# Patient Record
Sex: Female | Born: 1950
Health system: Southern US, Community
[De-identification: ages and names within clinical notes are randomized; demographics above are authoritative.]

## PROBLEM LIST (undated history)

## (undated) DIAGNOSIS — I5042 Chronic combined systolic (congestive) and diastolic (congestive) heart failure: Secondary | ICD-10-CM

## (undated) DIAGNOSIS — N186 End stage renal disease: Secondary | ICD-10-CM

## (undated) DIAGNOSIS — Z9289 Personal history of other medical treatment: Secondary | ICD-10-CM

## (undated) DIAGNOSIS — I10 Essential (primary) hypertension: Secondary | ICD-10-CM

## (undated) DIAGNOSIS — I2699 Other pulmonary embolism without acute cor pulmonale: Secondary | ICD-10-CM

## (undated) DIAGNOSIS — Z992 Dependence on renal dialysis: Secondary | ICD-10-CM

## (undated) DIAGNOSIS — F411 Generalized anxiety disorder: Secondary | ICD-10-CM

## (undated) HISTORY — DX: Chronic combined systolic (congestive) and diastolic (congestive) heart failure: I50.42

## (undated) HISTORY — PX: CATARACT EXTRACTION, BILATERAL: SHX1313

## (undated) HISTORY — DX: Generalized anxiety disorder: F41.1

## (undated) HISTORY — DX: Personal history of other medical treatment: Z92.89

## (undated) HISTORY — PX: OTHER SURGICAL HISTORY: SHX169

---

## 2019-11-19 ENCOUNTER — Ambulatory Visit (INDEPENDENT_AMBULATORY_CARE_PROVIDER_SITE_OTHER)
Admission: EM | Admit: 2019-11-19 | Discharge: 2019-11-19 | Disposition: A | Discharge status: Home / Self Care | Payer: No Typology Code available for payment source | Source: Home / Self Care | Attending: Family Medicine | Admitting: Family Medicine

## 2019-11-19 ENCOUNTER — Encounter (HOSPITAL_COMMUNITY): Payer: Self-pay

## 2019-11-19 ENCOUNTER — Emergency Department (HOSPITAL_COMMUNITY)
Admission: EM | Admit: 2019-11-19 | Discharge: 2019-11-20 | Disposition: A | Discharge status: Home / Self Care | Payer: No Typology Code available for payment source | Attending: Emergency Medicine | Admitting: Emergency Medicine

## 2019-11-19 ENCOUNTER — Ambulatory Visit (HOSPITAL_COMMUNITY): Admission: EM | Admit: 2019-11-19 | Discharge: 2019-11-19 | Discharge status: Absent without leave | Payer: Self-pay

## 2019-11-19 ENCOUNTER — Other Ambulatory Visit: Payer: Self-pay

## 2019-11-19 ENCOUNTER — Encounter (HOSPITAL_COMMUNITY): Payer: Self-pay | Admitting: *Deleted

## 2019-11-19 DIAGNOSIS — F329 Major depressive disorder, single episode, unspecified: Secondary | ICD-10-CM | POA: Diagnosis not present

## 2019-11-19 DIAGNOSIS — I10 Essential (primary) hypertension: Secondary | ICD-10-CM

## 2019-11-19 DIAGNOSIS — N390 Urinary tract infection, site not specified: Secondary | ICD-10-CM

## 2019-11-19 DIAGNOSIS — R809 Proteinuria, unspecified: Secondary | ICD-10-CM | POA: Insufficient documentation

## 2019-11-19 DIAGNOSIS — Z634 Disappearance and death of family member: Secondary | ICD-10-CM | POA: Diagnosis not present

## 2019-11-19 DIAGNOSIS — R600 Localized edema: Secondary | ICD-10-CM | POA: Insufficient documentation

## 2019-11-19 LAB — BASIC METABOLIC PANEL
Anion gap: 15 (ref 5–15)
BUN: 15 mg/dL (ref 8–23)
CO2: 27 mmol/L (ref 22–32)
Calcium: 9.8 mg/dL (ref 8.9–10.3)
Chloride: 98 mmol/L (ref 98–111)
Creatinine, Ser: 0.61 mg/dL (ref 0.44–1.00)
GFR calc Af Amer: >60 mL/min (ref >60)
GFR calc non Af Amer: >60 mL/min (ref >60)
Glucose, Bld: 162 mg/dL — ABNORMAL HIGH (ref 70–99)
Potassium: 3.7 mmol/L (ref 3.5–5.1)
Sodium: 140 mmol/L (ref 135–145)

## 2019-11-19 LAB — CBC
HCT: 37.4 % (ref 36.0–46.0)
Hemoglobin: 11.8 g/dL — ABNORMAL LOW (ref 12.0–15.0)
MCH: 26.8 pg (ref 26.0–34.0)
MCHC: 31.6 g/dL (ref 30.0–36.0)
MCV: 84.8 fL (ref 80.0–100.0)
Platelets: 253 10*3/uL (ref 150–400)
RBC: 4.41 MIL/uL (ref 3.87–5.11)
RDW: 13.1 % (ref 11.5–15.5)
WBC: 5.3 10*3/uL (ref 4.0–10.5)
nRBC: 0 % (ref 0.0–0.2)

## 2019-11-19 LAB — POCT URINALYSIS DIP (DEVICE)
Bilirubin Urine: NEGATIVE
Glucose, UA: NEGATIVE mg/dL
Ketones, ur: NEGATIVE mg/dL
Nitrite: NEGATIVE
Protein, ur: 100 mg/dL — AB
Specific Gravity, Urine: 1.01 (ref 1.005–1.030)
Urobilinogen, UA: 0.2 mg/dL (ref 0.0–1.0)
pH: 6 (ref 5.0–8.0)

## 2019-11-19 MED ORDER — CEPHALEXIN 500 MG PO CAPS: 500.0000 mg | ORAL_CAPSULE | Freq: Two times a day (BID) | ORAL | 0 refills | Status: AC

## 2019-11-19 NOTE — ED Triage Notes (Signed)
Pt reports she was sent from UC for eval of pedal edema and HTN. Went to UC for s/s of UTI. Denies dizziness, sob, CP.

## 2019-11-19 NOTE — ED Provider Notes (Signed)
Kara Mullins    CSN: [id number] Arrival date & time: 11/19/19  1854      History   Chief Complaint Chief Complaint  Patient presents with  . Urinary Tract Infection    HPI Kara Mullins is a 69 y.o. female.   Kara Mullins presents with complaints of cloudy urine as well as frequency of urination which she has noted the past week. Frequency for the past two weeks now. Some abdominal discomfort which she notes after urinating. No current abdominal pain. No back pain or fevers. No blood in urine. No headache, no vision changes. No dizziness. Has had lower leg swelling for the past week. Has been using compression stockings which have helped. Swelling improves over night once legs are elevated. No chest pain , no palpitations. No nausea or vomiting. She doesn't smoke. Doesn't follow regularly with a PCP. In the past few months she has lost her daughter as well as her dog, so has been under increased stress. Has never been diagnosed with hypertension.     ROS per HPI, negative if not otherwise mentioned.      History reviewed. No pertinent past medical history.  There are no problems to display for this patient.   History reviewed. No pertinent surgical history.  OB History   No obstetric history on file.      Home Medications    Prior to Admission medications   Medication Sig Start Date End Date Taking? Authorizing Provider  cephALEXin (KEFLEX) 500 MG capsule Take 1 capsule (500 mg total) by mouth 2 (two) times daily for 7 days. 11/19/19 11/26/19  Zigmund Gottron, NP    Family History Family History  Family history unknown: Yes    Social History Social History   Tobacco Use  . Smoking status: Never Smoker  . Smokeless tobacco: Never Used  Substance Use Topics  . Alcohol use: Not Currently  . Drug use: Never     Allergies   Patient has no known allergies.   Review of Systems Review of Systems   Physical Exam Triage Vital Signs ED Triage  Vitals  Enc Vitals Group     BP 11/19/19 1946 (!) 223/141     Pulse Rate 11/19/19 1946 87     Resp 11/19/19 1946 16     Temp 11/19/19 1946 98.4 F (36.9 C)     Temp Source 11/19/19 1946 Oral     SpO2 11/19/19 1946 100 %     Weight --      Height --      Head Circumference --      Peak Flow --      Pain Score 11/19/19 1943 0     Pain Loc --      Pain Edu? --      Excl. in [place]? --    No data found.  Updated Vital Signs BP (!) 183/121 (BP Location: Right Arm)   Pulse 87   Temp 98.4 F (36.9 C) (Oral)   Resp 16   SpO2 100%   Physical Exam Constitutional:      General: She is not in acute distress.    Appearance: She is well-developed.  Cardiovascular:     Rate and Rhythm: Normal rate.     Comments: Compression stockings in place with noticeable bilateral edema  Pulmonary:     Effort: Pulmonary effort is normal.  Abdominal:     Tenderness: There is no abdominal tenderness. There is no right CVA tenderness or left  CVA tenderness.  Musculoskeletal:     Right lower leg: 2+ Edema present.     Left lower leg: 2+ Edema present.  Skin:    General: Skin is warm and dry.  Neurological:     Mental Status: She is alert and oriented to person, place, and time.      UC Treatments / Results  Labs (all labs ordered are listed, but only abnormal results are displayed) Labs Reviewed  POCT URINALYSIS DIP (DEVICE) - Abnormal; Notable for the following components:      Result Value   Hgb urine dipstick MODERATE (*)    Protein, ur 100 (*)    Leukocytes,Ua SMALL (*)    All other components within normal limits  URINE CULTURE    EKG   Radiology No results found.  Procedures Procedures (including critical care time)  Medications Ordered in UC Medications - No data to display  Initial Impression / Assessment and Plan / UC Course  I have reviewed the triage vital signs and the nursing notes.  Pertinent labs & imaging results that were available during my care of the  patient were reviewed by me and considered in my medical decision making (see chart for details).     Afebrile. No tachycardia. No chest pain  Or palpitations. No dizziness headache or vision changes. bp initially 223/114. After resting in room and exam still with bp of 183/121. New onset lower extremity edema as well as proteinuria. No previous labs. Urine is concerning for UTI with culture to confirm and antibiotics initiated. Recommend further treatment in the ER for her elevated BP to safely lower it and treat. Discussed that will need to establish with a pcp for long term management. .Patient verbalized understanding and agreeable to plan.  Safe for self transport.  Final Clinical Impressions(s) / UC Diagnoses   Final diagnoses:  Hypertension, unspecified type  Proteinuria, unspecified type  Urinary tract infection without hematuria, site unspecified     Discharge Instructions     Your blood pressure remains at an unsafe level unfortunately.  Due to your new swelling to your legs, as well as protein in your urine, no previous evaluation for this, it is safest for you to go to the ER now for management of your blood pressure to ensure it comes down normally and safely.   You will need to establish care with a primary care provider for long term management of your blood pressure.   Your urine does look infected so I have started you on antibiotics for this with a culture to confirm findings.    ED Prescriptions    Medication Sig Dispense Auth. Provider   cephALEXin (KEFLEX) 500 MG capsule Take 1 capsule (500 mg total) by mouth 2 (two) times daily for 7 days. 14 capsule Zigmund Gottron, NP     PDMP not reviewed this encounter.   Zigmund Gottron, NP 11/19/19 2048

## 2019-11-19 NOTE — ED Triage Notes (Signed)
Patient presents to Urgent Care with complaints of cloudy urine since 6 days ago. Patient reports she thinks she may have a UTI.

## 2019-11-19 NOTE — Discharge Instructions (Signed)
Your blood pressure remains at an unsafe level unfortunately.  Due to your new swelling to your legs, as well as protein in your urine, no previous evaluation for this, it is safest for you to go to the ER now for management of your blood pressure to ensure it comes down normally and safely.   You will need to establish care with a primary care provider for long term management of your blood pressure.   Your urine does look infected so I have started you on antibiotics for this with a culture to confirm findings.

## 2019-11-20 NOTE — Discharge Instructions (Addendum)
You were evaluated in the Emergency Department and after careful evaluation, we did not find any emergent condition requiring admission or further testing in the hospital.  Your exam/testing today is overall reassuring.  Please follow-up with your primary care doctor to further discuss your blood pressure management.  We also recommend talking with a mental health expert regarding your depression.  Please return to the Emergency Department if you experience any worsening of your condition.  We encourage you to follow up with a primary care provider.  Thank you for allowing [place] to be a part of your care.

## 2019-11-20 NOTE — ED Notes (Signed)
Pt was discharged from the ED. Pt read and understood discharge paperwork. Pt had vital signs completed. E-signature unavailable. Pt conscious, breathing, and A&Ox4. No distress noted. Pt speaking in complete sentences. Pt brought out of the ED via wheelchair.

## 2019-11-20 NOTE — ED Provider Notes (Signed)
[facility] Emergency Department Provider Note MRN:  [id number]  Arrival date & time: 11/20/19     Chief Complaint   Hypertension   History of Present Illness   Kara Mullins is a 69 y.o. year-old female with no pertinent past medical history presenting to the ED with chief complaint of hypertension.  Several months of lower extremity swelling.  Found to be hypertensive at the urgent care today.  Sent here for further evaluation.  Patient is endorsing depression.  Her daughter passed away 2 months ago.  Denies suicidal ideation, no AVH.  Denies dizziness, no headache, no vision change, no chest pain, shortness of breath, no abdominal pain, no numbness or weakness to the arms or legs.  Review of Systems  A complete 10 system review of systems was obtained and all systems are negative except as noted in the HPI and PMH.   Patient's Health History   History reviewed. No pertinent past medical history.  History reviewed. No pertinent surgical history.  Family History  Family history unknown: Yes    Social History   Socioeconomic History  . Marital status: Divorced    Spouse name: Not on file  . Number of children: Not on file  . Years of education: Not on file  . Highest education level: Not on file  Occupational History  . Not on file  Tobacco Use  . Smoking status: Never Smoker  . Smokeless tobacco: Never Used  Substance and Sexual Activity  . Alcohol use: Not Currently  . Drug use: Never  . Sexual activity: Not on file  Other Topics Concern  . Not on file  Social History Narrative  . Not on file   Social Determinants of Health   Financial Resource Strain:   . Difficulty of Paying Living Expenses: Not on file  Food Insecurity:   . Worried About Charity fundraiser in the Last Year: Not on file  . Ran Out of Food in the Last Year: Not on file  Transportation Needs:   . Lack of Transportation (Medical): Not on file  . Lack of Transportation  (Non-Medical): Not on file  Physical Activity:   . Days of Exercise per Week: Not on file  . Minutes of Exercise per Session: Not on file  Stress:   . Feeling of Stress : Not on file  Social Connections:   . Frequency of Communication with Friends and Family: Not on file  . Frequency of Social Gatherings with Friends and Family: Not on file  . Attends Religious Services: Not on file  . Active Member of Clubs or Organizations: Not on file  . Attends Archivist Meetings: Not on file  . Marital Status: Not on file  Intimate Partner Violence:   . Fear of Current or Ex-Partner: Not on file  . Emotionally Abused: Not on file  . Physically Abused: Not on file  . Sexually Abused: Not on file     Physical Exam   Vitals:   11/19/19 2125  BP: (!) 167/98  Pulse: 88  Resp: 16  Temp: 98.2 F (36.8 C)  SpO2: 100%    CONSTITUTIONAL: Well-appearing, NAD NEURO:  Alert and oriented x 3, no focal deficits EYES:  eyes equal and reactive ENT/NECK:  no LAD, no JVD CARDIO: Regular rate, well-perfused, normal S1 and S2 PULM:  CTAB no wheezing or rhonchi GI/GU:  normal bowel sounds, non-distended, non-tender MSK/SPINE:  No gross deformities, no edema SKIN:  no rash, atraumatic PSYCH:  Appropriate speech and behavior  *Additional and/or pertinent findings included in MDM below  Diagnostic and Interventional Summary    EKG Interpretation  Date/Time:  Friday November 19 2019 21:28:33 EST Ventricular Rate:  89 PR Interval:  168 QRS Duration: 80 QT Interval:  404 QTC Calculation: 491 R Axis:   0 Text Interpretation: Normal sinus rhythm Anterior infarct , age undetermined Abnormal ECG No previous ECGs available Confirmed by Gerlene Fee [phone]) on 11/20/2019 1:30:52 AM      Cardiac Monitoring Interpretation:  Labs Reviewed  BASIC METABOLIC PANEL - Abnormal; Notable for the following components:      Result Value   Glucose, Bld 162 (*)    All other components within normal  limits  CBC - Abnormal; Notable for the following components:   Hemoglobin 11.8 (*)    All other components within normal limits    No orders to display    Medications - No data to display   Procedures  /  Critical Care Procedures  ED Course and Medical Decision Making  I have reviewed the triage vital signs, the nursing notes, and pertinent available records from the EMR.  Pertinent labs & imaging results that were available during my care of the patient were reviewed by me and considered in my medical decision making (see below for details).     Reassuring vital signs here in the emergency department, mildly hypertensive on arrival, only improving with time.  130/170 on my evaluation.  No concerning symptoms, largely asymptomatic hypertension.  Does have some chronic lower extremity swelling but labs are reassuring, no kidney dysfunction.  Urgent care note does comment on possible UTI with proteinuria.  No back pain, no fever to suggest pyelonephritis.  Patient is medically appropriate for PCP follow-up.  From a mental health perspective, she is depressed in the setting of bereavement, but is not a threat of harm to herself or others and can follow-up with a mental health expert or her PCP.    Barth Kirks. Sedonia Small, MD [place] [email or web address]  Final Clinical Impressions(s) / ED Diagnoses     ICD-10-CM   1. Bereavement  Z63.4   2. Hypertension, unspecified type  I10     ED Discharge Orders    None       Discharge Instructions Discussed with and Provided to Patient:     Discharge Instructions     You were evaluated in the Emergency Department and after careful evaluation, we did not find any emergent condition requiring admission or further testing in the hospital.  Your exam/testing today is overall reassuring.  Please follow-up with your primary care doctor to further discuss your blood pressure management.  We also  recommend talking with a mental health expert regarding your depression.  Please return to the Emergency Department if you experience any worsening of your condition.  We encourage you to follow up with a primary care provider.  Thank you for allowing [place] to be a part of your care.      Maudie Flakes, MD 11/20/19 [phone]

## 2019-11-22 LAB — URINE CULTURE: Culture: >=100000 — AB

## 2019-11-24 ENCOUNTER — Telehealth (HOSPITAL_COMMUNITY): Payer: Self-pay | Admitting: Emergency Medicine

## 2019-11-24 MED ORDER — SULFAMETHOXAZOLE-TRIMETHOPRIM 800-160 MG PO TABS: 1.0000 | ORAL_TABLET | Freq: Two times a day (BID) | ORAL | 0 refills | Status: AC

## 2019-11-24 NOTE — Telephone Encounter (Signed)
Reviewed labs with Dr. Mannie Stabile. Contacted pt to see how she was feeling. Pt states "im still urinating frequently". Per Dr. Mannie Stabile, send in Bactrim BID x3 days. Pt has follow up appt with PCP on Friday for recheck. Pt will pick up new medication and follow up on Friday with her doctor. All questions answered.

## 2019-12-16 ENCOUNTER — Ambulatory Visit: Payer: PRIVATE HEALTH INSURANCE | Attending: Internal Medicine

## 2019-12-16 DIAGNOSIS — Z23 Encounter for immunization: Secondary | ICD-10-CM | POA: Insufficient documentation

## 2019-12-16 NOTE — Progress Notes (Signed)
   Covid-19 Vaccination Clinic  Name:  Kara Mullins    MRN: [id number] DOB: 1951/06/26  12/16/2019  [name] was observed post Covid-19 immunization for 15 minutes without incident. She was provided with Vaccine Information Sheet and instruction to access the V-Safe system.   [name] was instructed to call 911 with any severe reactions post vaccine: Marland Kitchen Difficulty breathing  . Swelling of face and throat  . A fast heartbeat  . A bad rash all over body  . Dizziness and weakness   Immunizations Administered    Name Date Dose VIS Date Route   Pfizer COVID-19 Vaccine 12/16/2019  5:34 PM 0.3 mL 09/24/2019 Intramuscular   Manufacturer: [place]   Lot: [id number]   [place]: [id number]

## 2020-01-12 ENCOUNTER — Ambulatory Visit: Payer: PRIVATE HEALTH INSURANCE | Attending: Internal Medicine

## 2020-01-12 DIAGNOSIS — Z23 Encounter for immunization: Secondary | ICD-10-CM

## 2020-01-12 NOTE — Progress Notes (Signed)
   Covid-19 Vaccination Clinic  Name:  Kara Mullins    MRN: [id number] DOB: Sep 15, 1951  01/12/2020  Kara Mullins was observed post Covid-19 immunization for 15 minutes without incident. She was provided with Vaccine Information Sheet and instruction to access the V-Safe system.   Kara Mullins was instructed to call 911 with any severe reactions post vaccine: Marland Kitchen Difficulty breathing  . Swelling of face and throat  . A fast heartbeat  . A bad rash all over body  . Dizziness and weakness   Immunizations Administered    Name Date Dose VIS Date Route   Pfizer COVID-19 Vaccine 01/12/2020  4:07 PM 0.3 mL 09/24/2019 Intramuscular   Manufacturer: [place]   Lot: [id number]   [place]: [id number]

## 2021-03-04 ENCOUNTER — Encounter (HOSPITAL_COMMUNITY): Payer: Self-pay | Admitting: Emergency Medicine

## 2021-03-04 ENCOUNTER — Other Ambulatory Visit: Payer: Self-pay

## 2021-03-04 ENCOUNTER — Inpatient Hospital Stay (HOSPITAL_COMMUNITY)
Admission: EM | Admit: 2021-03-04 | Discharge: 2021-03-15 | DRG: 291 | Disposition: A | Discharge status: Home / Self Care | Payer: Medicare Other | Attending: Internal Medicine | Admitting: Internal Medicine

## 2021-03-04 ENCOUNTER — Emergency Department (HOSPITAL_COMMUNITY): Payer: Medicare Other

## 2021-03-04 DIAGNOSIS — J90 Pleural effusion, not elsewhere classified: Secondary | ICD-10-CM

## 2021-03-04 DIAGNOSIS — R600 Localized edema: Secondary | ICD-10-CM

## 2021-03-04 DIAGNOSIS — I5043 Acute on chronic combined systolic (congestive) and diastolic (congestive) heart failure: Secondary | ICD-10-CM | POA: Diagnosis present

## 2021-03-04 DIAGNOSIS — I130 Hypertensive heart and chronic kidney disease with heart failure and stage 1 through stage 4 chronic kidney disease, or unspecified chronic kidney disease: Principal | ICD-10-CM | POA: Diagnosis present

## 2021-03-04 DIAGNOSIS — I509 Heart failure, unspecified: Secondary | ICD-10-CM

## 2021-03-04 DIAGNOSIS — R112 Nausea with vomiting, unspecified: Secondary | ICD-10-CM | POA: Diagnosis present

## 2021-03-04 DIAGNOSIS — N949 Unspecified condition associated with female genital organs and menstrual cycle: Secondary | ICD-10-CM

## 2021-03-04 DIAGNOSIS — Z20822 Contact with and (suspected) exposure to covid-19: Secondary | ICD-10-CM | POA: Diagnosis present

## 2021-03-04 DIAGNOSIS — N179 Acute kidney failure, unspecified: Secondary | ICD-10-CM

## 2021-03-04 DIAGNOSIS — I890 Lymphedema, not elsewhere classified: Secondary | ICD-10-CM | POA: Diagnosis present

## 2021-03-04 DIAGNOSIS — R9431 Abnormal electrocardiogram [ECG] [EKG]: Secondary | ICD-10-CM | POA: Diagnosis present

## 2021-03-04 DIAGNOSIS — R778 Other specified abnormalities of plasma proteins: Secondary | ICD-10-CM | POA: Diagnosis present

## 2021-03-04 DIAGNOSIS — R001 Bradycardia, unspecified: Secondary | ICD-10-CM | POA: Diagnosis not present

## 2021-03-04 DIAGNOSIS — I5032 Chronic diastolic (congestive) heart failure: Secondary | ICD-10-CM

## 2021-03-04 DIAGNOSIS — I43 Cardiomyopathy in diseases classified elsewhere: Secondary | ICD-10-CM | POA: Diagnosis present

## 2021-03-04 DIAGNOSIS — T502X5A Adverse effect of carbonic-anhydrase inhibitors, benzothiadiazides and other diuretics, initial encounter: Secondary | ICD-10-CM | POA: Diagnosis not present

## 2021-03-04 DIAGNOSIS — T508X5A Adverse effect of diagnostic agents, initial encounter: Secondary | ICD-10-CM | POA: Diagnosis not present

## 2021-03-04 DIAGNOSIS — N1831 Chronic kidney disease, stage 3a: Secondary | ICD-10-CM | POA: Diagnosis present

## 2021-03-04 DIAGNOSIS — E854 Organ-limited amyloidosis: Secondary | ICD-10-CM | POA: Diagnosis present

## 2021-03-04 DIAGNOSIS — E876 Hypokalemia: Secondary | ICD-10-CM | POA: Diagnosis not present

## 2021-03-04 DIAGNOSIS — D509 Iron deficiency anemia, unspecified: Secondary | ICD-10-CM | POA: Diagnosis present

## 2021-03-04 DIAGNOSIS — N132 Hydronephrosis with renal and ureteral calculous obstruction: Secondary | ICD-10-CM | POA: Diagnosis present

## 2021-03-04 DIAGNOSIS — R809 Proteinuria, unspecified: Secondary | ICD-10-CM | POA: Diagnosis present

## 2021-03-04 DIAGNOSIS — N141 Nephropathy induced by other drugs, medicaments and biological substances: Secondary | ICD-10-CM | POA: Diagnosis present

## 2021-03-04 DIAGNOSIS — R634 Abnormal weight loss: Secondary | ICD-10-CM | POA: Diagnosis present

## 2021-03-04 DIAGNOSIS — Z79899 Other long term (current) drug therapy: Secondary | ICD-10-CM

## 2021-03-04 DIAGNOSIS — E8809 Other disorders of plasma-protein metabolism, not elsewhere classified: Secondary | ICD-10-CM | POA: Diagnosis present

## 2021-03-04 DIAGNOSIS — R609 Edema, unspecified: Secondary | ICD-10-CM

## 2021-03-04 DIAGNOSIS — K56609 Unspecified intestinal obstruction, unspecified as to partial versus complete obstruction: Secondary | ICD-10-CM

## 2021-03-04 DIAGNOSIS — I160 Hypertensive urgency: Secondary | ICD-10-CM | POA: Diagnosis present

## 2021-03-04 DIAGNOSIS — I5041 Acute combined systolic (congestive) and diastolic (congestive) heart failure: Secondary | ICD-10-CM

## 2021-03-04 DIAGNOSIS — R7989 Other specified abnormal findings of blood chemistry: Secondary | ICD-10-CM | POA: Diagnosis present

## 2021-03-04 DIAGNOSIS — I10 Essential (primary) hypertension: Secondary | ICD-10-CM

## 2021-03-04 DIAGNOSIS — K5900 Constipation, unspecified: Secondary | ICD-10-CM | POA: Diagnosis present

## 2021-03-04 DIAGNOSIS — I501 Left ventricular failure: Secondary | ICD-10-CM

## 2021-03-04 DIAGNOSIS — N170 Acute kidney failure with tubular necrosis: Secondary | ICD-10-CM | POA: Diagnosis not present

## 2021-03-04 DIAGNOSIS — F411 Generalized anxiety disorder: Secondary | ICD-10-CM | POA: Diagnosis present

## 2021-03-04 HISTORY — DX: Essential (primary) hypertension: I10

## 2021-03-04 LAB — COMPREHENSIVE METABOLIC PANEL
ALT: 17 U/L (ref 0–44)
AST: 29 U/L (ref 15–41)
Albumin: 3.1 g/dL — ABNORMAL LOW (ref 3.5–5.0)
Alkaline Phosphatase: 107 U/L (ref 38–126)
Anion gap: 7 (ref 5–15)
BUN: 26 mg/dL — ABNORMAL HIGH (ref 8–23)
CO2: 25 mmol/L (ref 22–32)
Calcium: 8.6 mg/dL — ABNORMAL LOW (ref 8.9–10.3)
Chloride: 105 mmol/L (ref 98–111)
Creatinine, Ser: 1.29 mg/dL — ABNORMAL HIGH (ref 0.44–1.00)
GFR, Estimated: 45 mL/min — ABNORMAL LOW (ref >60)
Glucose, Bld: 127 mg/dL — ABNORMAL HIGH (ref 70–99)
Potassium: 3.9 mmol/L (ref 3.5–5.1)
Sodium: 137 mmol/L (ref 135–145)
Total Bilirubin: 0.7 mg/dL (ref 0.3–1.2)
Total Protein: 7.4 g/dL (ref 6.5–8.1)

## 2021-03-04 LAB — LIPASE, BLOOD: Lipase: 22 U/L (ref 11–51)

## 2021-03-04 LAB — TROPONIN I (HIGH SENSITIVITY): Troponin I (High Sensitivity): 31 ng/L — ABNORMAL HIGH (ref <18)

## 2021-03-04 MED ORDER — SODIUM CHLORIDE 0.9 % IV SOLN
12.5000 mg | Freq: Four times a day (QID) | INTRAVENOUS | Status: DC | PRN
  Administered 2021-03-04 – 2021-03-06 (×5): 12.5 mg via INTRAVENOUS
  Filled 2021-03-04 (×3): qty 12.5
  Filled 2021-03-04: qty 0.5
  Filled 2021-03-04 (×2): qty 12.5

## 2021-03-04 MED ORDER — LABETALOL HCL 5 MG/ML IV SOLN
20.0000 mg | Freq: Once | INTRAVENOUS | Status: AC
  Administered 2021-03-04: 20 mg via INTRAVENOUS
  Filled 2021-03-04: qty 4

## 2021-03-04 MED ORDER — ONDANSETRON 4 MG PO TBDP
4.0000 mg | ORAL_TABLET | Freq: Once | ORAL | Status: AC
  Administered 2021-03-04: 4 mg via ORAL
  Filled 2021-03-04: qty 1

## 2021-03-04 NOTE — ED Provider Notes (Signed)
[facility] DEPT Provider Note: Georgena Spurling, MD, FACEP  CSN: [id number] MRN: [id number] ARRIVAL: 03/04/21 at 2137 ROOM: WA20/WA20   CHIEF COMPLAINT  Vomiting   HISTORY OF PRESENT ILLNESS  03/04/21 10:56 PM Kara Mullins is a 70 y.o. female who was constipated this morning and drank a cup of tea and soon afterwards had a normal bowel movement.  About 2 PM she started having nausea and vomiting after eating lunch.  She is having associated cramping, "jumping" pain in her lower abdomen which she rates as a 3 out of 10.  It is not significantly worse with palpation.  She states she was having difficulty breathing through her nose earlier but that has improved.  She denies feeling short of breath in her chest.  She states her legs have been swelling over the past several days.  Although she is hypertensive in the ED she states she is on medication for hypertension but does not know the name.  Review of records shows she is on hydrochlorothiazide.   Past Medical History:  Diagnosis Date  . Hypertension     No past surgical history on file.  Family History  Family history unknown: Yes    Social History   Tobacco Use  . Smoking status: Never Smoker  . Smokeless tobacco: Never Used  Vaping Use  . Vaping Use: Never used  Substance Use Topics  . Alcohol use: Not Currently  . Drug use: Never    Prior to Admission medications   Not on File    Allergies Patient has no known allergies.   REVIEW OF SYSTEMS  Negative except as noted here or in the History of Present Illness.   PHYSICAL EXAMINATION  Initial Vital Signs Blood pressure (!) 215/131, pulse 81, temperature (!) 97.5 F (36.4 C), temperature source Oral, resp. rate 15, SpO2 100 %.  Examination General: Well-developed, well-nourished female in no acute distress; appearance consistent with age of record HENT: normocephalic; atraumatic; edentulous Eyes: pupils equal, round and reactive to light; extraocular  muscles intact; arcus senilis bilaterally Neck: supple Heart: regular rate and rhythm Lungs: clear to auscultation bilaterally Abdomen: soft; nondistended; minimal lower abdominal tenderness; bowel sounds present Extremities: No deformity; full range of motion; 3+ edema of lower legs Neurologic: Awake, alert and oriented; motor function intact in all extremities and symmetric; no facial droop Skin: Warm and dry Psychiatric: Normal mood and affect   RESULTS  Summary of this visit's results, reviewed and interpreted by myself:   EKG Interpretation  Date/Time:  Sunday Mar 04 2021 23:18:45 EDT Ventricular Rate:  83 PR Interval:  179 QRS Duration: 94 QT Interval:  433 QTC Calculation: 509 R Axis:   8 Text Interpretation: Sinus rhythm Probable left atrial enlargement Probable anterior infarct, old Prolonged QT interval No significant change was found Confirmed by Shanon Rosser [phone]) on 03/05/2021 12:57:44 AM      Laboratory Studies: Results for orders placed or performed during the hospital encounter of 03/04/21 (from the past 24 hour(s))  CBC with Differential     Status: Abnormal   Collection Time: 03/04/21  9:44 PM  Result Value Ref Range   WBC 5.5 4.0 - 10.5 K/uL   RBC 3.84 (L) 3.87 - 5.11 MIL/uL   Hemoglobin 10.1 (L) 12.0 - 15.0 g/dL   HCT 32.9 (L) 36.0 - 46.0 %   MCV 85.7 80.0 - 100.0 fL   MCH 26.3 26.0 - 34.0 pg   MCHC 30.7 30.0 - 36.0 g/dL   RDW 14.7  11.5 - 15.5 %   Platelets 265 150 - 400 K/uL   nRBC 0.0 0.0 - 0.2 %   Neutrophils Relative % 84 %   Neutro Abs 4.7 1.7 - 7.7 K/uL   Lymphocytes Relative 11 %   Lymphs Abs 0.6 (L) 0.7 - 4.0 K/uL   Monocytes Relative 4 %   Monocytes Absolute 0.2 0.1 - 1.0 K/uL   Eosinophils Relative 0 %   Eosinophils Absolute 0.0 0.0 - 0.5 K/uL   Basophils Relative 1 %   Basophils Absolute 0.0 0.0 - 0.1 K/uL   Immature Granulocytes 0 %   Abs Immature Granulocytes 0.01 0.00 - 0.07 K/uL  Comprehensive metabolic panel     Status:  Abnormal   Collection Time: 03/04/21  9:44 PM  Result Value Ref Range   Sodium 137 135 - 145 mmol/L   Potassium 3.9 3.5 - 5.1 mmol/L   Chloride 105 98 - 111 mmol/L   CO2 25 22 - 32 mmol/L   Glucose, Bld 127 (H) 70 - 99 mg/dL   BUN 26 (H) 8 - 23 mg/dL   Creatinine, Ser 1.29 (H) 0.44 - 1.00 mg/dL   Calcium 8.6 (L) 8.9 - 10.3 mg/dL   Total Protein 7.4 6.5 - 8.1 g/dL   Albumin 3.1 (L) 3.5 - 5.0 g/dL   AST 29 15 - 41 U/L   ALT 17 0 - 44 U/L   Alkaline Phosphatase 107 38 - 126 U/L   Total Bilirubin 0.7 0.3 - 1.2 mg/dL   GFR, Estimated 45 (L) >60 mL/min   Anion gap 7 5 - 15  Lipase, blood     Status: None   Collection Time: 03/04/21  9:44 PM  Result Value Ref Range   Lipase 22 11 - 51 U/L  Troponin I (High Sensitivity)     Status: Abnormal   Collection Time: 03/04/21  9:44 PM  Result Value Ref Range   Troponin I (High Sensitivity) 31 (H) <18 ng/L  Brain natriuretic peptide     Status: Abnormal   Collection Time: 03/04/21 10:58 PM  Result Value Ref Range   B Natriuretic Peptide 1,196.9 (H) 0.0 - 100.0 pg/mL  D-dimer, quantitative     Status: Abnormal   Collection Time: 03/04/21 11:12 PM  Result Value Ref Range   D-Dimer, Quant 6.36 (H) 0.00 - 0.50 ug/mL-FEU   Imaging Studies: DG Chest 2 View  Result Date: 03/04/2021 CLINICAL DATA:  Hypertension. EXAM: CHEST - 2 VIEW COMPARISON:  None. FINDINGS: The heart size and mediastinal contours are within normal limits. Aortic arch calcifications. Streaky bibasilar airspace opacities. Increased interstitial markings. Trace right volume and small to moderate left volume pleural effusions. No pneumothorax. No acute osseous abnormality. IMPRESSION: Pulmonary edema with trace right and small to moderate left volume pleural effusions. Superimposed infection/inflammation not excluded. Followup PA and lateral chest X-ray is recommended in 3-4 weeks following therapy to ensure resolution. Electronically Signed   By: Iven Finn M.D.   On: 03/04/2021  22:40    ED COURSE and MDM  Nursing notes, initial and subsequent vitals signs, including pulse oximetry, reviewed and interpreted by myself.  Vitals:   03/05/21 0315 03/05/21 0330 03/05/21 0345 03/05/21 0400  BP: (!) 187/101 (!) 195/99 (!) 186/122 (!) 192/103  Pulse: 82 80 78 70  Resp: 20 (!) 21 (!) 21 (!) 23  Temp:      TempSrc:      SpO2: 94% 96% 91% 97%   Medications  promethazine (PHENERGAN) 12.5 mg  in sodium chloride 0.9 % 50 mL IVPB (0 mg Intravenous Stopped 03/05/21 0226)  ondansetron (ZOFRAN-ODT) disintegrating tablet 4 mg (4 mg Oral Given 03/04/21 2148)  labetalol (NORMODYNE) injection 20 mg (20 mg Intravenous Given 03/04/21 2350)  hyoscyamine (ANASPAZ) disintergrating tablet 0.125 mg (0.125 mg Oral Given 03/05/21 0119)  LORazepam (ATIVAN) injection 1 mg (1 mg Intravenous Given 03/05/21 0258)   11:17 PM The patient does not have any respiratory difficulty at the time so I will avoid diuresing her given that she has had nausea and vomiting.  Conversely I do not believe she needs a fluid bolus as this would exacerbate the CHF and possibly exacerbate the recent onset edema of her lower extremities.  2:56 AM Patient continues to retch despite Zofran and Phenergan.  I am reticent to use any additional Zofran or Reglan as she shows QT prolongation on her EKG.  Her abdominal cramping is improved with sublingual hyoscyamine.   4:06 AM Discussed with Dr. Cyd Silence of hospitalist service.  He agrees the patient would benefit from admission as this appears to be previously undiagnosed congestive heart failure and pleural effusions.  She also has significant lower extremity edema.  This could represent volume overload due to his CHF.  Her elevated D-dimer is concerning for thromboembolic disease and she will likely need Dopplers and VQ scan or CT angio chest.  Her nausea and vomiting have been controlled with Ativan and she is sleeping comfortably presently.  We wish to avoid QT prolonging  antiemetics as her EKG is showing QT prolongation.  PROCEDURES  Procedures CRITICAL CARE Performed by: Karen Chafe Edu On Total critical care time: 30 minutes Critical care time was exclusive of separately billable procedures and treating other patients. Critical care was necessary to treat or prevent imminent or life-threatening deterioration. Critical care was time spent personally by me on the following activities: development of treatment plan with patient and/or surrogate as well as nursing, discussions with consultants, evaluation of patient's response to treatment, examination of patient, obtaining history from patient or surrogate, ordering and performing treatments and interventions, ordering and review of laboratory studies, ordering and review of radiographic studies, pulse oximetry and re-evaluation of patient's condition.   ED DIAGNOSES     ICD-10-CM   1. Nausea and vomiting in adult  R11.2   2. Hypertensive urgency  I16.0   3. Pulmonary edema with congestive heart failure (HCC)  I50.1   4. Bilateral pleural effusion  J90   5. Elevated d-dimer  R79.89   6. Peripheral edema  R60.9        Zakiyah Diop, Jenny Reichmann, MD 03/05/21 [phone]

## 2021-03-04 NOTE — ED Provider Notes (Signed)
Emergency Medicine Provider Triage Evaluation Note  Kara Mullins , a 70 y.o. female  was evaluated in triage.  Pt complains of abdominal pain, nausea and vomiting after eating soup tonight. States she was constipated earlier and drank a cup of tea with relief. Also having SHOB. No CP, no fever, no sick contacts. .  Review of Systems  Positive: SHOB, abdominal pain, vomiting Negative: Diarrhea, fever  Physical Exam  BP (!) 206/114   Pulse 91   Temp 97.6 F (36.4 C) (Oral)   Resp 18   SpO2 100%  Gen:   Awake, no distress , appears uncomfortable  Resp:  Normal effort  MSK:   Moves extremities without difficulty  Other:  Abdomen non tender on seated exam  Medical Decision Making  Medically screening exam initiated at 9:42 PM.  Appropriate orders placed.  Kara Mullins was informed that the remainder of the evaluation will be completed by another provider, this initial triage assessment does not replace that evaluation, and the importance of remaining in the ED until their evaluation is complete.     Tacy Learn, [name] 03/04/21 2143    Charlesetta Shanks, MD 03/19/21 1053

## 2021-03-04 NOTE — ED Triage Notes (Signed)
Pt presents from home with daughter, c/o n/v and SOB starting this evening. Denies CP, reports some abd discomfort. Last BM today. States she has not had home BP meds today

## 2021-03-05 ENCOUNTER — Emergency Department (HOSPITAL_COMMUNITY): Payer: Medicare Other

## 2021-03-05 ENCOUNTER — Observation Stay (HOSPITAL_BASED_OUTPATIENT_CLINIC_OR_DEPARTMENT_OTHER): Payer: Medicare Other

## 2021-03-05 ENCOUNTER — Encounter (HOSPITAL_COMMUNITY): Payer: Self-pay

## 2021-03-05 ENCOUNTER — Observation Stay (HOSPITAL_COMMUNITY): Payer: Medicare Other

## 2021-03-05 DIAGNOSIS — I5040 Unspecified combined systolic (congestive) and diastolic (congestive) heart failure: Secondary | ICD-10-CM | POA: Diagnosis not present

## 2021-03-05 DIAGNOSIS — I5032 Chronic diastolic (congestive) heart failure: Secondary | ICD-10-CM

## 2021-03-05 DIAGNOSIS — I509 Heart failure, unspecified: Secondary | ICD-10-CM

## 2021-03-05 DIAGNOSIS — R609 Edema, unspecified: Secondary | ICD-10-CM | POA: Diagnosis not present

## 2021-03-05 DIAGNOSIS — R778 Other specified abnormalities of plasma proteins: Secondary | ICD-10-CM

## 2021-03-05 DIAGNOSIS — F411 Generalized anxiety disorder: Secondary | ICD-10-CM

## 2021-03-05 DIAGNOSIS — R7989 Other specified abnormal findings of blood chemistry: Secondary | ICD-10-CM | POA: Diagnosis present

## 2021-03-05 DIAGNOSIS — I160 Hypertensive urgency: Secondary | ICD-10-CM | POA: Diagnosis present

## 2021-03-05 DIAGNOSIS — R112 Nausea with vomiting, unspecified: Secondary | ICD-10-CM | POA: Diagnosis not present

## 2021-03-05 LAB — COMPREHENSIVE METABOLIC PANEL
ALT: 14 U/L (ref 0–44)
AST: 28 U/L (ref 15–41)
Albumin: 2.6 g/dL — ABNORMAL LOW (ref 3.5–5.0)
Alkaline Phosphatase: 91 U/L (ref 38–126)
Anion gap: 7 (ref 5–15)
BUN: 27 mg/dL — ABNORMAL HIGH (ref 8–23)
CO2: 24 mmol/L (ref 22–32)
Calcium: 8.3 mg/dL — ABNORMAL LOW (ref 8.9–10.3)
Chloride: 107 mmol/L (ref 98–111)
Creatinine, Ser: 1.35 mg/dL — ABNORMAL HIGH (ref 0.44–1.00)
GFR, Estimated: 43 mL/min — ABNORMAL LOW (ref >60)
Glucose, Bld: 130 mg/dL — ABNORMAL HIGH (ref 70–99)
Potassium: 4 mmol/L (ref 3.5–5.1)
Sodium: 138 mmol/L (ref 135–145)
Total Bilirubin: 0.5 mg/dL (ref 0.3–1.2)
Total Protein: 6.1 g/dL — ABNORMAL LOW (ref 6.5–8.1)

## 2021-03-05 LAB — CBC WITH DIFFERENTIAL/PLATELET
Abs Immature Granulocytes: 0.01 10*3/uL (ref 0.00–0.07)
Abs Immature Granulocytes: 0.01 10*3/uL (ref 0.00–0.07)
Basophils Absolute: 0 10*3/uL (ref 0.0–0.1)
Basophils Absolute: 0 10*3/uL (ref 0.0–0.1)
Basophils Relative: 1 %
Basophils Relative: 0 %
Eosinophils Absolute: 0 10*3/uL (ref 0.0–0.5)
Eosinophils Absolute: 0 10*3/uL (ref 0.0–0.5)
Eosinophils Relative: 0 %
Eosinophils Relative: 0 %
HCT: 32.9 % — ABNORMAL LOW (ref 36.0–46.0)
HCT: 29.7 % — ABNORMAL LOW (ref 36.0–46.0)
Hemoglobin: 10.1 g/dL — ABNORMAL LOW (ref 12.0–15.0)
Hemoglobin: 9.5 g/dL — ABNORMAL LOW (ref 12.0–15.0)
Immature Granulocytes: 0 %
Immature Granulocytes: 0 %
Lymphocytes Relative: 11 %
Lymphocytes Relative: 15 %
Lymphs Abs: 0.6 10*3/uL — ABNORMAL LOW (ref 0.7–4.0)
Lymphs Abs: 0.8 10*3/uL (ref 0.7–4.0)
MCH: 26.3 pg (ref 26.0–34.0)
MCH: 27.1 pg (ref 26.0–34.0)
MCHC: 30.7 g/dL (ref 30.0–36.0)
MCHC: 32 g/dL (ref 30.0–36.0)
MCV: 85.7 fL (ref 80.0–100.0)
MCV: 84.6 fL (ref 80.0–100.0)
Monocytes Absolute: 0.2 10*3/uL (ref 0.1–1.0)
Monocytes Absolute: 0.3 10*3/uL (ref 0.1–1.0)
Monocytes Relative: 4 %
Monocytes Relative: 6 %
Neutro Abs: 4.7 10*3/uL (ref 1.7–7.7)
Neutro Abs: 4.2 10*3/uL (ref 1.7–7.7)
Neutrophils Relative %: 84 %
Neutrophils Relative %: 79 %
Platelets: 265 10*3/uL (ref 150–400)
Platelets: 234 10*3/uL (ref 150–400)
RBC: 3.84 MIL/uL — ABNORMAL LOW (ref 3.87–5.11)
RBC: 3.51 MIL/uL — ABNORMAL LOW (ref 3.87–5.11)
RDW: 14.7 % (ref 11.5–15.5)
RDW: 14.9 % (ref 11.5–15.5)
WBC: 5.5 10*3/uL (ref 4.0–10.5)
WBC: 5.3 10*3/uL (ref 4.0–10.5)
nRBC: 0 % (ref 0.0–0.2)
nRBC: 0 % (ref 0.0–0.2)

## 2021-03-05 LAB — URINALYSIS, ROUTINE W REFLEX MICROSCOPIC
Bilirubin Urine: NEGATIVE
Glucose, UA: 50 mg/dL — AB
Ketones, ur: 5 mg/dL — AB
Leukocytes,Ua: NEGATIVE
Nitrite: NEGATIVE
Protein, ur: >=300 mg/dL — AB
Specific Gravity, Urine: 1.013 (ref 1.005–1.030)
pH: 5 (ref 5.0–8.0)

## 2021-03-05 LAB — RESP PANEL BY RT-PCR (FLU A&B, COVID) ARPGX2
Influenza A by PCR: NEGATIVE
Influenza B by PCR: NEGATIVE
SARS Coronavirus 2 by RT PCR: NEGATIVE

## 2021-03-05 LAB — ECHOCARDIOGRAM COMPLETE
Area-P 1/2: 3.6 cm2
Calc EF: 49.9 %
Radius: 0.3 cm
S' Lateral: 3.73 cm
Single Plane A2C EF: 47.4 %
Single Plane A4C EF: 50.9 %

## 2021-03-05 LAB — HIV ANTIBODY (ROUTINE TESTING W REFLEX): HIV Screen 4th Generation wRfx: NONREACTIVE

## 2021-03-05 LAB — D-DIMER, QUANTITATIVE: D-Dimer, Quant: 6.36 ug/mL-FEU — ABNORMAL HIGH (ref 0.00–0.50)

## 2021-03-05 LAB — TROPONIN I (HIGH SENSITIVITY): Troponin I (High Sensitivity): 29 ng/L — ABNORMAL HIGH (ref <18)

## 2021-03-05 LAB — BRAIN NATRIURETIC PEPTIDE: B Natriuretic Peptide: 1196.9 pg/mL — ABNORMAL HIGH (ref 0.0–100.0)

## 2021-03-05 LAB — MAGNESIUM: Magnesium: 2.3 mg/dL (ref 1.7–2.4)

## 2021-03-05 MED ORDER — SODIUM CHLORIDE (PF) 0.9 % IJ SOLN
INTRAMUSCULAR | Status: AC
  Filled 2021-03-05: qty 50

## 2021-03-05 MED ORDER — LORAZEPAM 2 MG/ML IJ SOLN
1.0000 mg | Freq: Once | INTRAMUSCULAR | Status: AC
  Administered 2021-03-05: 1 mg via INTRAVENOUS
  Filled 2021-03-05: qty 1

## 2021-03-05 MED ORDER — HYDRALAZINE HCL 20 MG/ML IJ SOLN
10.0000 mg | Freq: Four times a day (QID) | INTRAMUSCULAR | Status: DC | PRN
  Administered 2021-03-05 – 2021-03-11 (×2): 10 mg via INTRAVENOUS
  Filled 2021-03-05 (×2): qty 1

## 2021-03-05 MED ORDER — HYOSCYAMINE SULFATE 0.125 MG PO TBDP
0.1250 mg | ORAL_TABLET | Freq: Once | ORAL | Status: AC
  Administered 2021-03-05: 0.125 mg via ORAL
  Filled 2021-03-05 (×2): qty 1

## 2021-03-05 MED ORDER — CARVEDILOL 12.5 MG PO TABS
12.5000 mg | ORAL_TABLET | Freq: Two times a day (BID) | ORAL | Status: DC
  Administered 2021-03-05 – 2021-03-07 (×5): 12.5 mg via ORAL
  Filled 2021-03-05 (×5): qty 1

## 2021-03-05 MED ORDER — ACETAMINOPHEN 650 MG RE SUPP: 650.0000 mg | Freq: Four times a day (QID) | RECTAL | Status: DC | PRN

## 2021-03-05 MED ORDER — ONDANSETRON HCL 4 MG PO TABS: 4.0000 mg | ORAL_TABLET | Freq: Four times a day (QID) | ORAL | Status: DC | PRN

## 2021-03-05 MED ORDER — ONDANSETRON HCL 4 MG/2ML IJ SOLN
4.0000 mg | Freq: Four times a day (QID) | INTRAMUSCULAR | Status: DC | PRN
  Administered 2021-03-05 – 2021-03-06 (×4): 4 mg via INTRAVENOUS
  Filled 2021-03-05 (×4): qty 2

## 2021-03-05 MED ORDER — FUROSEMIDE 10 MG/ML IJ SOLN
40.0000 mg | Freq: Two times a day (BID) | INTRAMUSCULAR | Status: DC
  Administered 2021-03-05: 40 mg via INTRAVENOUS
  Filled 2021-03-05: qty 4

## 2021-03-05 MED ORDER — ACETAMINOPHEN 325 MG PO TABS: 650.0000 mg | ORAL_TABLET | Freq: Four times a day (QID) | ORAL | Status: DC | PRN

## 2021-03-05 MED ORDER — ENOXAPARIN SODIUM 40 MG/0.4ML IJ SOSY
40.0000 mg | PREFILLED_SYRINGE | INTRAMUSCULAR | Status: DC
  Administered 2021-03-05 – 2021-03-15 (×11): 40 mg via SUBCUTANEOUS
  Filled 2021-03-05 (×11): qty 0.4

## 2021-03-05 MED ORDER — ESCITALOPRAM OXALATE 10 MG PO TABS
10.0000 mg | ORAL_TABLET | Freq: Every day | ORAL | Status: DC
  Administered 2021-03-05 – 2021-03-15 (×11): 10 mg via ORAL
  Filled 2021-03-05 (×11): qty 1

## 2021-03-05 MED ORDER — HYDROCHLOROTHIAZIDE 25 MG PO TABS
25.0000 mg | ORAL_TABLET | Freq: Every day | ORAL | Status: DC
  Administered 2021-03-05: 25 mg via ORAL
  Filled 2021-03-05: qty 1

## 2021-03-05 MED ORDER — POLYETHYLENE GLYCOL 3350 17 G PO PACK: 17.0000 g | PACK | Freq: Every day | ORAL | Status: DC | PRN

## 2021-03-05 MED ORDER — IOHEXOL 350 MG/ML SOLN
100.0000 mL | Freq: Once | INTRAVENOUS | Status: AC | PRN
  Administered 2021-03-05: 100 mL via INTRAVENOUS

## 2021-03-05 NOTE — Progress Notes (Signed)
Subjective: Patient admitted this morning, see detailed H&P by Dr Cyd Silence 70 year old female with past medical history of hypertension, lymphedema, anxiety disorder who presented to the ED with multiple complaints including shortness of breath, cough, peripheral edema, nausea and vomiting.  Patient also has been experiencing several month history of increasing bilateral lower extremity edema.  Over the past 24 hours she developed nausea and frequent bouts of vomiting.  Vitals:   03/05/21 1104 03/05/21 1330  BP:  (!) 184/95  Pulse: 73 72  Resp:  18  Temp:    SpO2: 97% 98%      A/P Acute CHF exacerbation-BNP was elevated 1196, chest x-ray showed minimal pulmonary edema.  Patient was started on IV Lasix twice a day.  She does not appear to be volume overloaded, renal function is little worse this morning.  I will hold further Lasix.  Follow BMP in am.  Follow echocardiogram.  Hypertensive urgency-hold HCTZ, start Coreg 12.5 mg p.o. twice daily, continue hydralazine 10 mg IV every 6 hours as needed  Intractable nausea and vomiting-etiology is not clear.  Abdomen x-ray shows no evidence of obstruction.  LFTs are unremarkable.    [place] Hospitalist [phone]

## 2021-03-05 NOTE — Progress Notes (Incomplete)
BLE venous duplex has been completed.  Dr. Benita Gutter, RN messaged with results.  Results can be found under chart review under CV PROC. 03/05/2021 9:40 AM Kara Mullins RVT, RDMS

## 2021-03-05 NOTE — H&P (Signed)
History and Physical    Kara Mullins [id number] DOB: [birth year] DOA: 03/04/2021  PCP: Forrest Moron, MD  Patient coming from: Home   Chief Complaint:  Chief Complaint  Patient presents with  . Vomiting     HPI:    70 year old female with past medical history of hypertension, lymphedema and anxiety disorder who presents to [facility] emergency department with multiple complaints including shortness of breath, cough, peripheral edema nausea and vomiting.  Patient is a somewhat poor historian due to Ativan received in the emergency department and therefore the majority the history is been obtained from the daughter who is at the bedside.  Apparently the patient has been experiencing a several month history of increasing bilateral lower extremity edema.  Over the past several weeks in particular the patient seems to have also been developing paroxysmal nocturnal dyspnea and pillow orthopnea.  For approximately past 1 week the patient has now also begun to develop a cough, intermittently productive with white frothy sputum.  Patient denies any associated chest pain, fevers, recent travel, sick contacts or contact with confirmed COVID-19 infection  Over the past 24 hours the patient has now begun to develop nausea with frequent bouts of vomiting.  This constellation of symptoms the patient then presented to [facility] emergency department for evaluation.  Upon evaluation in the emergency department clinically patient was felt to be volume overloaded likely secondary to undiagnosed congestive heart failure.  A dose of intravenous Lasix was ordered.  Patient was also given several antiemetics for ongoing nausea and vomiting.  Review of Systems:   Review of Systems  Respiratory: Positive for cough and shortness of breath.   Cardiovascular: Positive for orthopnea, leg swelling and PND.  Gastrointestinal: Positive for nausea and vomiting.  All other systems  reviewed and are negative.   Past Medical History:  Diagnosis Date  . Hypertension     No past surgical history on file.   reports that she has never smoked. She has never used smokeless tobacco. She reports previous alcohol use. She reports that she does not use drugs.  No Known Allergies  Family History  Family history unknown: Yes     Prior to Admission medications   Medication Sig Start Date End Date Taking? Authorizing Provider  bisacodyl (FLEET) 10 MG/30ML ENEM Place 10 mg rectally once.   Yes [provider]  escitalopram (LEXAPRO) 10 MG tablet Take 1 tablet by mouth daily. 11/06/20  Yes [provider]  hydrochlorothiazide (HYDRODIURIL) 25 MG tablet Take 1 tablet by mouth daily. 01/08/21  Yes [provider]    Physical Exam: Vitals:   03/05/21 0515 03/05/21 0600 03/05/21 0630 03/05/21 0700  BP: (!) 194/101 (!) 167/97 (!) 185/93 (!) 177/88  Pulse: 77 77 78 76  Resp: 20 (!) '21 19 19  '$ Temp:      TempSrc:      SpO2: 100% 96% 96% 95%    Constitutional: Patient is quite lethargic but arousable and oriented x3, no associated distress.   Skin: no rashes, no lesions, good skin turgor noted. Eyes: Pupils are equally reactive to light.  No evidence of scleral icterus or conjunctival pallor.  ENMT: Moist mucous membranes noted.  Posterior pharynx clear of any exudate or lesions.   Neck: normal, supple, no masses, no thyromegaly.  Notable jugular venous distention Respiratory: Notable bibasilar rales.  No evidence of significant wheezing.  Normal respiratory effort. No accessory muscle use.  Cardiovascular: Regular rate and rhythm, no murmurs /  rubs / gallops. No extremity  2+ pedal pulses. No carotid bruits.  Significant bilateral lower extremity pitting edema that tracks from the feet all the way up to the thighs. Chest:   Nontender without crepitus or deformity.   Back:   Nontender without crepitus or deformity. Abdomen: Abdomen is quite  protuberant but soft and nontender.  No evidence of intra-abdominal masses.  Positive bowel sounds noted in all quadrants.   Musculoskeletal: No joint deformity upper and lower extremities. Good ROM, no contractures. Normal muscle tone.  Neurologic: CN 2-12 grossly intact. Sensation intact.  Patient moving all 4 extremities spontaneously.  Patient is following all commands.  Patient is responsive to verbal stimuli.   Psychiatric: Unable to completely assess due to significant lethargy.  Patient does still process insight as to her current situation.  Labs on Admission: I have personally reviewed following labs and imaging studies -   CBC: Recent Labs  Lab 03/04/21 2144  WBC 5.5  NEUTROABS 4.7  HGB 10.1*  HCT 32.9*  MCV 85.7  PLT [id number]   Basic Metabolic Panel: Recent Labs  Lab 03/04/21 2144  NA 137  K 3.9  CL 105  CO2 25  GLUCOSE 127*  BUN 26*  CREATININE 1.29*  CALCIUM 8.6*   GFR: CrCl cannot be calculated (Unknown ideal weight.). Liver Function Tests: Recent Labs  Lab 03/04/21 2144  AST 29  ALT 17  ALKPHOS 107  BILITOT 0.7  PROT 7.4  ALBUMIN 3.1*   Recent Labs  Lab 03/04/21 2144  LIPASE 22   No results for input(s): AMMONIA in the last 168 hours. Coagulation Profile: No results for input(s): INR, PROTIME in the last 168 hours. Cardiac Enzymes: No results for input(s): CKTOTAL, CKMB, CKMBINDEX, TROPONINI in the last 168 hours. BNP (last 3 results) No results for input(s): PROBNP in the last 8760 hours. HbA1C: No results for input(s): HGBA1C in the last 72 hours. CBG: No results for input(s): GLUCAP in the last 168 hours. Lipid Profile: No results for input(s): CHOL, HDL, LDLCALC, TRIG, CHOLHDL, LDLDIRECT in the last 72 hours. Thyroid Function Tests: No results for input(s): TSH, T4TOTAL, FREET4, T3FREE, THYROIDAB in the last 72 hours. Anemia Panel: No results for input(s): VITAMINB12, FOLATE, FERRITIN, TIBC, IRON, RETICCTPCT in the last 72 hours. Urine  analysis:    Component Value Date/Time   LABSPEC 1.010 11/19/2019 2004   PHURINE 6.0 11/19/2019 2004   GLUCOSEU NEGATIVE 11/19/2019 2004   HGBUR MODERATE (A) 11/19/2019 2004   BILIRUBINUR NEGATIVE 11/19/2019 2004   [place] NEGATIVE 11/19/2019 2004   PROTEINUR 100 (A) 11/19/2019 2004   UROBILINOGEN 0.2 11/19/2019 2004   NITRITE NEGATIVE 11/19/2019 2004   LEUKOCYTESUR SMALL (A) 11/19/2019 2004    Radiological Exams on Admission - Personally Reviewed: DG Chest 2 View  Result Date: 03/04/2021 CLINICAL DATA:  Hypertension. EXAM: CHEST - 2 VIEW COMPARISON:  None. FINDINGS: The heart size and mediastinal contours are within normal limits. Aortic arch calcifications. Streaky bibasilar airspace opacities. Increased interstitial markings. Trace right volume and small to moderate left volume pleural effusions. No pneumothorax. No acute osseous abnormality. IMPRESSION: Pulmonary edema with trace right and small to moderate left volume pleural effusions. Superimposed infection/inflammation not excluded. Followup PA and lateral chest X-ray is recommended in 3-4 weeks following therapy to ensure resolution. Electronically Signed   By: Iven Finn M.D.   On: 03/04/2021 22:40   CT ANGIO CHEST PE W OR WO CONTRAST  Result Date: 03/05/2021 CLINICAL DATA:  Pt presents from home  with daughter, c/o n/v and SOB starting this evening. Denies CP, reports some abd discomfort. Last BM today. States she has not had home BP meds today. EXAM: CT ANGIOGRAPHY CHEST WITH CONTRAST TECHNIQUE: Multidetector CT imaging of the chest was performed using the standard protocol during bolus administration of intravenous contrast. Multiplanar CT image reconstructions and MIPs were obtained to evaluate the vascular anatomy. CONTRAST:  134m OMNIPAQUE IOHEXOL 350 MG/ML SOLN COMPARISON:  Chest x-ray 03/04/2021 FINDINGS: Cardiovascular: Satisfactory opacification of the pulmonary arteries to the segmental level. No evidence of  pulmonary embolism. Normal heart size. No pericardial effusion. Aorta atherosclerotic plaque. Mediastinum/Nodes: No enlarged mediastinal, hilar, or axillary lymph nodes. Thyroid gland, trachea, and esophagus demonstrate no significant findings. Lungs/Pleura: Interlobular septal wall thickening. Left upper lobe atelectasis. Passive atelectasis bilateral lower lobes. No focal consolidation. Few scattered pulmonary micronodules. No pulmonary mass. Trace to small volume left and small to moderate volume right pleural effusions. No pneumothorax. Upper Abdomen: No acute abnormality. Musculoskeletal: No abdominal wall hernia or abnormality. No suspicious lytic or blastic osseous lesions. No acute displaced fracture. Multilevel degenerative changes of the spine. Levoscoliosis of the thoracolumbar spine. Review of the MIP images confirms the above findings. IMPRESSION: 1. No pulmonary embolus. 2. Pulmonary edema with trace to small volume left and small to moderate volume right pleural effusions. 3.  Aortic Atherosclerosis (ICD10-I70.0). Electronically Signed   By: MIven FinnM.D.   On: 03/05/2021 05:36    EKG: Personally reviewed.  Rhythm is normal sinus rhythm with heart rate of 83 bpm.  No dynamic ST segment changes appreciated.  Assessment/Plan Principal Problem:   [place]   Patient presenting with several month history of increasing peripheral edema with several week history of increasing pillow orthopnea and paroxysmal nocturnal dyspnea and 1 week history of cough  Evaluation in the emergency department reveals markedly elevated BNP of 1196 with evidence of cardiogenic pulmonary edema on chest x-ray.  Placing patient on intravenous Lasix twice daily  Echocardiogram in the morning  Strict input and output monitoring  Daily weights  Monitoring renal function and electrolytes with serial chemistries  Active Problems:   Hypertensive urgency   Resuming home  regimen of hydrochlorothiazide  As needed intravenous antihypertensives for markedly elevated blood pressure  Will titrate oral antihypertensives as necessary to achieve improved blood pressure control    Elevated d-dimer   Markedly elevated D-dimer  Obtaining CT angiogram of the chest and if this is negative for pulmonary embolism we will obtain bilateral lower extremity ultrasound to evaluate for DVT.    Nausea and vomiting   Etiology not completely clear, possibly secondary to bowel wall edema  Lipase and hepatic function panel unremarkable  Will obtain abdominal x-ray to rule out obstruction  As needed Antiemetics    Elevated troponin level not due myocardial infarction   Slightly elevated troponin with flat trajectory makes plaque rupture unlikely  Likely secondary to supply demand mismatch in the setting of acute congestive heart failure  Monitoring patient on telemetry  Chest pain-free    Generalized anxiety disorder   Continue home regimen of Lexapro    Code Status:  Full code Family Communication: deferred   Status is: Observation  The patient remains OBS appropriate and will d/c before 2 midnights.  Dispo: The patient is from: Home              Anticipated d/c is to: Home  Patient currently is not medically stable to d/c.   Difficult to place patient No        Vernelle Emerald MD [facility] Pager [phone]  If 7PM-7AM, please contact night-coverage [email or web address] Use universal [place] password for that web site. If you do not have the password, please call the hospital operator.  03/05/2021, 7:35 AM

## 2021-03-05 NOTE — ED Notes (Addendum)
Per Jody, RVT pt is negative for DVT.

## 2021-03-05 NOTE — Progress Notes (Signed)
  Echocardiogram 2D Echocardiogram has been performed.  Kara Mullins 03/05/2021, 1:55 PM

## 2021-03-05 NOTE — ED Notes (Signed)
Patient transported to CT 

## 2021-03-05 NOTE — Plan of Care (Signed)

## 2021-03-06 DIAGNOSIS — I43 Cardiomyopathy in diseases classified elsewhere: Secondary | ICD-10-CM | POA: Diagnosis present

## 2021-03-06 DIAGNOSIS — I509 Heart failure, unspecified: Secondary | ICD-10-CM | POA: Diagnosis present

## 2021-03-06 DIAGNOSIS — E876 Hypokalemia: Secondary | ICD-10-CM | POA: Diagnosis not present

## 2021-03-06 DIAGNOSIS — I5043 Acute on chronic combined systolic (congestive) and diastolic (congestive) heart failure: Secondary | ICD-10-CM | POA: Diagnosis present

## 2021-03-06 DIAGNOSIS — D509 Iron deficiency anemia, unspecified: Secondary | ICD-10-CM | POA: Diagnosis present

## 2021-03-06 DIAGNOSIS — E854 Organ-limited amyloidosis: Secondary | ICD-10-CM | POA: Diagnosis present

## 2021-03-06 DIAGNOSIS — I160 Hypertensive urgency: Secondary | ICD-10-CM | POA: Diagnosis present

## 2021-03-06 DIAGNOSIS — R112 Nausea with vomiting, unspecified: Secondary | ICD-10-CM

## 2021-03-06 DIAGNOSIS — I890 Lymphedema, not elsewhere classified: Secondary | ICD-10-CM | POA: Diagnosis present

## 2021-03-06 DIAGNOSIS — R7989 Other specified abnormal findings of blood chemistry: Secondary | ICD-10-CM

## 2021-03-06 DIAGNOSIS — D649 Anemia, unspecified: Secondary | ICD-10-CM | POA: Diagnosis not present

## 2021-03-06 DIAGNOSIS — R634 Abnormal weight loss: Secondary | ICD-10-CM | POA: Diagnosis present

## 2021-03-06 DIAGNOSIS — N179 Acute kidney failure, unspecified: Secondary | ICD-10-CM | POA: Diagnosis not present

## 2021-03-06 DIAGNOSIS — I10 Essential (primary) hypertension: Secondary | ICD-10-CM | POA: Diagnosis not present

## 2021-03-06 DIAGNOSIS — I5031 Acute diastolic (congestive) heart failure: Secondary | ICD-10-CM | POA: Diagnosis not present

## 2021-03-06 DIAGNOSIS — I959 Hypotension, unspecified: Secondary | ICD-10-CM | POA: Diagnosis not present

## 2021-03-06 DIAGNOSIS — N141 Nephropathy induced by other drugs, medicaments and biological substances: Secondary | ICD-10-CM | POA: Diagnosis present

## 2021-03-06 DIAGNOSIS — Z79899 Other long term (current) drug therapy: Secondary | ICD-10-CM | POA: Diagnosis not present

## 2021-03-06 DIAGNOSIS — I5041 Acute combined systolic (congestive) and diastolic (congestive) heart failure: Secondary | ICD-10-CM | POA: Diagnosis not present

## 2021-03-06 DIAGNOSIS — N1831 Chronic kidney disease, stage 3a: Secondary | ICD-10-CM | POA: Diagnosis present

## 2021-03-06 DIAGNOSIS — I130 Hypertensive heart and chronic kidney disease with heart failure and stage 1 through stage 4 chronic kidney disease, or unspecified chronic kidney disease: Secondary | ICD-10-CM | POA: Diagnosis present

## 2021-03-06 DIAGNOSIS — F411 Generalized anxiety disorder: Secondary | ICD-10-CM | POA: Diagnosis present

## 2021-03-06 DIAGNOSIS — R001 Bradycardia, unspecified: Secondary | ICD-10-CM | POA: Diagnosis not present

## 2021-03-06 DIAGNOSIS — R809 Proteinuria, unspecified: Secondary | ICD-10-CM | POA: Diagnosis present

## 2021-03-06 DIAGNOSIS — N189 Chronic kidney disease, unspecified: Secondary | ICD-10-CM | POA: Diagnosis not present

## 2021-03-06 DIAGNOSIS — Z20822 Contact with and (suspected) exposure to covid-19: Secondary | ICD-10-CM | POA: Diagnosis present

## 2021-03-06 DIAGNOSIS — T508X5A Adverse effect of diagnostic agents, initial encounter: Secondary | ICD-10-CM | POA: Diagnosis not present

## 2021-03-06 DIAGNOSIS — N170 Acute kidney failure with tubular necrosis: Secondary | ICD-10-CM | POA: Diagnosis not present

## 2021-03-06 DIAGNOSIS — R9431 Abnormal electrocardiogram [ECG] [EKG]: Secondary | ICD-10-CM | POA: Diagnosis present

## 2021-03-06 DIAGNOSIS — N132 Hydronephrosis with renal and ureteral calculous obstruction: Secondary | ICD-10-CM | POA: Diagnosis present

## 2021-03-06 DIAGNOSIS — K5900 Constipation, unspecified: Secondary | ICD-10-CM | POA: Diagnosis present

## 2021-03-06 LAB — CBC
HCT: 28.3 % — ABNORMAL LOW (ref 36.0–46.0)
Hemoglobin: 8.8 g/dL — ABNORMAL LOW (ref 12.0–15.0)
MCH: 26.6 pg (ref 26.0–34.0)
MCHC: 31.1 g/dL (ref 30.0–36.0)
MCV: 85.5 fL (ref 80.0–100.0)
Platelets: 234 10*3/uL (ref 150–400)
RBC: 3.31 MIL/uL — ABNORMAL LOW (ref 3.87–5.11)
RDW: 14.9 % (ref 11.5–15.5)
WBC: 6.1 10*3/uL (ref 4.0–10.5)
nRBC: 0 % (ref 0.0–0.2)

## 2021-03-06 LAB — COMPREHENSIVE METABOLIC PANEL
ALT: 14 U/L (ref 0–44)
AST: 24 U/L (ref 15–41)
Albumin: 2.5 g/dL — ABNORMAL LOW (ref 3.5–5.0)
Alkaline Phosphatase: 82 U/L (ref 38–126)
Anion gap: 7 (ref 5–15)
BUN: 30 mg/dL — ABNORMAL HIGH (ref 8–23)
CO2: 24 mmol/L (ref 22–32)
Calcium: 8.4 mg/dL — ABNORMAL LOW (ref 8.9–10.3)
Chloride: 110 mmol/L (ref 98–111)
Creatinine, Ser: 1.59 mg/dL — ABNORMAL HIGH (ref 0.44–1.00)
GFR, Estimated: 35 mL/min — ABNORMAL LOW (ref >60)
Glucose, Bld: 112 mg/dL — ABNORMAL HIGH (ref 70–99)
Potassium: 3.9 mmol/L (ref 3.5–5.1)
Sodium: 141 mmol/L (ref 135–145)
Total Bilirubin: 0.7 mg/dL (ref 0.3–1.2)
Total Protein: 5.8 g/dL — ABNORMAL LOW (ref 6.5–8.1)

## 2021-03-06 MED ORDER — FAMOTIDINE 20 MG PO TABS
20.0000 mg | ORAL_TABLET | Freq: Two times a day (BID) | ORAL | Status: DC
  Administered 2021-03-06 – 2021-03-15 (×19): 20 mg via ORAL
  Filled 2021-03-06 (×18): qty 1

## 2021-03-06 MED ORDER — FUROSEMIDE 10 MG/ML IJ SOLN
80.0000 mg | Freq: Two times a day (BID) | INTRAMUSCULAR | Status: DC
  Administered 2021-03-06: 80 mg via INTRAVENOUS
  Filled 2021-03-06 (×2): qty 8

## 2021-03-06 NOTE — Progress Notes (Signed)
[facility]  PROGRESS NOTE  Kara Mullins [id number] DOB: 1951-09-10 DOA: 03/04/2021 PCP: Forrest Moron, MD   Brief HPI:   70 year old female with past medical history of hypertension, lymphedema, anxiety disorder who presented to the ED with multiple complaints including shortness of breath, cough, peripheral edema, nausea and vomiting.  Patient also has been experiencing several month history of increasing bilateral lower extremity edema.  Over the past 24 hours she developed nausea and frequent bouts of vomiting.  Patient was admitted with CHF exacerbation, intractable nausea and vomiting.     Subjective   Patient seen, continues to have nausea.  Echocardiogram obtained yesterday showed EF 45%, decreased left ventricular function, global hypokinesis.  LVH, grade 1 diastolic dysfunction.  Consider cardiac amyloidosis.   Assessment/Plan:     1. New onset CHF-echocardiogram showed global hypokinesis of left ventricle, EF [id number], grade 1 diastolic dysfunction.  Consider cardiac amyloidosis.  Currently appears euvolemic.  Patient was started on Lasix 40 mg IV every 12 hours however Lasix was held yesterday as patient appeared dry and euvolemic.  BNP was elevated 1196.  Lasix on hold due to worsening of renal function.  Will consult cardiology for further evaluation and management.  Continue Coreg 12.5 mg p.o. twice daily. 2. Acute kidney injury on CKD stage IIIa-patient presented with creatinine of 1.35.  Started on Lasix 40 mg IV every 12 hours, creatinine has been increased to 1.59.  Likely from overdiuresis.  We will hold Lasix and follow BMP in am. 3. Intractable nausea and vomiting-improved, unclear etiology.  Patient is on heart healthy diet.  Will add Pepcid 20 mg p.o. twice daily.  Continue Zofran as needed for nausea and vomiting.  Abdominal x-ray is unremarkable.  LFTs are normal.  Continue to monitor. 4. Hypertension-blood pressure was elevated, started on Coreg 12.5 mg  p.o. twice daily, as needed hydralazine.  HCTZ on hold. 5. Elevated D-dimer-D-dimer was elevated, CTA chest was negative for pulmonary embolism.  Bilateral venous duplex was negative for DVT. 6.     Scheduled medications:   . carvedilol  12.5 mg Oral BID WC  . enoxaparin (LOVENOX) injection  40 mg Subcutaneous Q24H  . escitalopram  10 mg Oral Daily  . famotidine  20 mg Oral BID         Data Reviewed:   CBG:  No results for input(s): GLUCAP in the last 168 hours.  SpO2: 96 %    Vitals:   03/05/21 1755 03/05/21 1832 03/05/21 2054 03/06/21 0435  BP: (!) 199/99 126/67 128/66 (!) 163/81  Pulse: 73 75 66 73  Resp: '20 19 18 18  '$ Temp: 97.8 F (36.6 C)  98 F (36.7 C) 97.7 F (36.5 C)  TempSrc:    Oral  SpO2: 100% 99% 97% 96%     Intake/Output Summary (Last 24 hours) at 03/06/2021 1201 Last data filed at 03/06/2021 0900 Gross per 24 hour  Intake 253.33 ml  Output --  Net 253.33 ml    05/22 1901 - 05/24 0700 In: 193.3  Out: -   There were no vitals filed for this visit.  CBC:  Recent Labs  Lab 03/04/21 2144 03/05/21 0842 03/06/21 0445  WBC 5.5 5.3 6.1  HGB 10.1* 9.5* 8.8*  HCT 32.9* 29.7* 28.3*  PLT 265 234 234  MCV 85.7 84.6 85.5  MCH 26.3 27.1 26.6  MCHC 30.7 32.0 31.1  RDW 14.7 14.9 14.9  LYMPHSABS 0.6* 0.8  --   MONOABS 0.2 0.3  --  EOSABS 0.0 0.0  --   BASOSABS 0.0 0.0  --     Complete metabolic panel:  Recent Labs  Lab 03/04/21 2144 03/04/21 2258 03/04/21 2312 03/05/21 0842 03/06/21 0445  NA 137  --   --  138 141  K 3.9  --   --  4.0 3.9  CL 105  --   --  107 110  CO2 25  --   --  24 24  GLUCOSE 127*  --   --  130* 112*  BUN 26*  --   --  27* 30*  CREATININE 1.29*  --   --  1.35* 1.59*  CALCIUM 8.6*  --   --  8.3* 8.4*  AST 29  --   --  28 24  ALT 17  --   --  14 14  ALKPHOS 107  --   --  91 82  BILITOT 0.7  --   --  0.5 0.7  ALBUMIN 3.1*  --   --  2.6* 2.5*  MG  --   --   --  2.3  --   DDIMER  --   --  6.36*  --   --    BNP  --  1,196.9*  --   --   --     Recent Labs  Lab 03/04/21 2144  LIPASE 22    Recent Labs  Lab 03/04/21 2258 03/04/21 2312 03/05/21 0407  DDIMER  --  6.36*  --   BNP 1,196.9*  --   --   SARSCOV2NAA  --   --  NEGATIVE    ------------------------------------------------------------------------------------------------------------------ No results for input(s): CHOL, HDL, LDLCALC, TRIG, CHOLHDL, LDLDIRECT in the last 72 hours.  No results found for: HGBA1C ------------------------------------------------------------------------------------------------------------------ No results for input(s): TSH, T4TOTAL, T3FREE, THYROIDAB in the last 72 hours.  Invalid input(s): FREET3 ------------------------------------------------------------------------------------------------------------------ No results for input(s): VITAMINB12, FOLATE, FERRITIN, TIBC, IRON, RETICCTPCT in the last 72 hours.  Coagulation profile No results for input(s): INR, PROTIME in the last 168 hours. Recent Labs    03/04/21 2312  DDIMER 6.36*    Cardiac Enzymes No results for input(s): CKTOTAL, CKMB, CKMBINDEX, TROPONINI in the last 168 hours.  ------------------------------------------------------------------------------------------------------------------    Component Value Date/Time   BNP 1,196.9 (H) 03/04/2021 2258     Antibiotics: Anti-infectives (From admission, onward)   None       Radiology Reports  DG Chest 2 View  Result Date: 03/04/2021 CLINICAL DATA:  Hypertension. EXAM: CHEST - 2 VIEW COMPARISON:  None. FINDINGS: The heart size and mediastinal contours are within normal limits. Aortic arch calcifications. Streaky bibasilar airspace opacities. Increased interstitial markings. Trace right volume and small to moderate left volume pleural effusions. No pneumothorax. No acute osseous abnormality. IMPRESSION: Pulmonary edema with trace right and small to moderate left volume pleural  effusions. Superimposed infection/inflammation not excluded. Followup PA and lateral chest X-ray is recommended in 3-4 weeks following therapy to ensure resolution. Electronically Signed   By: Iven Finn M.D.   On: 03/04/2021 22:40   CT ANGIO CHEST PE W OR WO CONTRAST  Result Date: 03/05/2021 CLINICAL DATA:  Pt presents from home with daughter, c/o n/v and SOB starting this evening. Denies CP, reports some abd discomfort. Last BM today. States she has not had home BP meds today. EXAM: CT ANGIOGRAPHY CHEST WITH CONTRAST TECHNIQUE: Multidetector CT imaging of the chest was performed using the standard protocol during bolus administration of intravenous contrast. Multiplanar CT image reconstructions and MIPs were obtained  to evaluate the vascular anatomy. CONTRAST:  121m OMNIPAQUE IOHEXOL 350 MG/ML SOLN COMPARISON:  Chest x-ray 03/04/2021 FINDINGS: Cardiovascular: Satisfactory opacification of the pulmonary arteries to the segmental level. No evidence of pulmonary embolism. Normal heart size. No pericardial effusion. Aorta atherosclerotic plaque. Mediastinum/Nodes: No enlarged mediastinal, hilar, or axillary lymph nodes. Thyroid gland, trachea, and esophagus demonstrate no significant findings. Lungs/Pleura: Interlobular septal wall thickening. Left upper lobe atelectasis. Passive atelectasis bilateral lower lobes. No focal consolidation. Few scattered pulmonary micronodules. No pulmonary mass. Trace to small volume left and small to moderate volume right pleural effusions. No pneumothorax. Upper Abdomen: No acute abnormality. Musculoskeletal: No abdominal wall hernia or abnormality. No suspicious lytic or blastic osseous lesions. No acute displaced fracture. Multilevel degenerative changes of the spine. Levoscoliosis of the thoracolumbar spine. Review of the MIP images confirms the above findings. IMPRESSION: 1. No pulmonary embolus. 2. Pulmonary edema with trace to small volume left and small to moderate  volume right pleural effusions. 3.  Aortic Atherosclerosis (ICD10-I70.0). Electronically Signed   By: MIven FinnM.D.   On: 03/05/2021 05:36   DG Abd 2 Views  Result Date: 03/05/2021 CLINICAL DATA:  Nausea vomiting, mid abdominal discomfort since yesterday EXAM: ABDOMEN - 2 VIEW COMPARISON:  None. FINDINGS: The bowel gas pattern is normal. There is no evidence of free air. No radio-opaque calculi or other significant radiographic abnormality is seen. IMPRESSION: No evidence of bowel obstruction. Electronically Signed   By: JMaurine Simmering  On: 03/05/2021 09:52   ECHOCARDIOGRAM COMPLETE  Result Date: 03/05/2021    ECHOCARDIOGRAM REPORT   Patient Name:   Kara GRAMLEYDate of Exam: 03/05/2021 Medical Rec #:  [id number]     Height:       60.0 in Accession #:    [id number]    Weight:       160.0 lb Date of Birth:  [birth year]    BSA:          1.698 m Patient Age:    89years       BP:           182/85 mmHg Patient Gender: F              HR:           73 bpm. Exam Location:  Inpatient Procedure: 2D Echo, Cardiac Doppler and Color Doppler Indications:    I50.40* Unspecified combined systolic (congestive) and diastolic                 (congestive) heart failure  History:        Patient has no prior history of Echocardiogram examinations.                 Signs/Symptoms:Chest Pain. Elevated troponin. Elevated d dimer.  Sonographer:    TRoseanna RainbowRDCS Referring Phys: [id number] [place] 1. Left ventricular ejection fraction, by estimation, is 45%. The left ventricle has mildly decreased function. The left ventricle demonstrates global hypokinesis. There is severe left ventricular hypertrophy. Left ventricular diastolic parameters are consistent with Grade I diastolic dysfunction (impaired relaxation). Would consider cardiac amyloidosis.  2. Right ventricular systolic function is normal. The right ventricular size is normal. There is mildly elevated pulmonary artery systolic pressure. The  estimated right ventricular systolic pressure is 4123456mmHg.  3. Left atrial size was severely dilated.  4. The mitral valve is normal in structure. Mild to moderate mitral valve regurgitation. No evidence of mitral stenosis.  5. The aortic valve is tricuspid. Aortic valve regurgitation is trivial. No aortic stenosis is present.  6. Aortic dilatation noted. There is mild dilatation of the aortic root, measuring 37 mm.  7. The inferior vena cava is dilated in size with <50% respiratory variability, suggesting right atrial pressure of 15 mmHg.  8. There is a left pleural effusion and trivial pericardial effusion. FINDINGS  Left Ventricle: Left ventricular ejection fraction, by estimation, is 45%. The left ventricle has mildly decreased function. The left ventricle demonstrates global hypokinesis. The left ventricular internal cavity size was normal in size. There is severe left ventricular hypertrophy. Left ventricular diastolic parameters are consistent with Grade I diastolic dysfunction (impaired relaxation). Right Ventricle: The right ventricular size is normal. No increase in right ventricular wall thickness. Right ventricular systolic function is normal. There is mildly elevated pulmonary artery systolic pressure. The tricuspid regurgitant velocity is 2.57  m/s, and with an assumed right atrial pressure of 15 mmHg, the estimated right ventricular systolic pressure is 123456 mmHg. Left Atrium: Left atrial size was severely dilated. Right Atrium: Right atrial size was normal in size. Pericardium: There is a left pleural effusion. Trivial pericardial effusion is present. Mitral Valve: The mitral valve is normal in structure. Mild mitral annular calcification. Mild to moderate mitral valve regurgitation. No evidence of mitral valve stenosis. Tricuspid Valve: The tricuspid valve is normal in structure. Tricuspid valve regurgitation is trivial. Aortic Valve: The aortic valve is tricuspid. Aortic valve regurgitation is  trivial. No aortic stenosis is present. Pulmonic Valve: The pulmonic valve was normal in structure. Pulmonic valve regurgitation is trivial. Aorta: Aortic dilatation noted. There is mild dilatation of the aortic root, measuring 37 mm. Venous: The inferior vena cava is dilated in size with less than 50% respiratory variability, suggesting right atrial pressure of 15 mmHg. IAS/Shunts: No atrial level shunt detected by color flow Doppler.  LEFT VENTRICLE PLAX 2D LVIDd:         5.02 cm      Diastology LVIDs:         3.73 cm      LV e' medial:    4.38 cm/s LV PW:         1.86 cm      LV E/e' medial:  20.4 LV IVS:        1.53 cm      LV e' lateral:   5.51 cm/s LVOT diam:     1.80 cm      LV E/e' lateral: 16.2 LV SV:         62 LV SV Index:   37 LVOT Area:     2.54 cm  LV Volumes (MOD) LV vol d, MOD A2C: 88.2 ml LV vol d, MOD A4C: 113.0 ml LV vol s, MOD A2C: 46.4 ml LV vol s, MOD A4C: 55.5 ml LV SV MOD A2C:     41.8 ml LV SV MOD A4C:     113.0 ml LV SV MOD BP:      50.2 ml RIGHT VENTRICLE             IVC RV S prime:     14.10 cm/s  IVC diam: 2.58 cm TAPSE (M-mode): 2.3 cm LEFT ATRIUM             Index       RIGHT ATRIUM           Index LA diam:        3.30 cm 1.94 cm/m  RA  Area:     13.60 cm LA Vol (A2C):   85.4 ml 50.30 ml/m RA Volume:   28.80 ml  16.96 ml/m LA Vol (A4C):   99.1 ml 58.37 ml/m LA Biplane Vol: 97.9 ml 57.67 ml/m  AORTIC VALVE LVOT Vmax:   110.00 cm/s LVOT Vmean:  80.500 cm/s LVOT VTI:    0.245 m  AORTA Ao Root diam: 3.70 cm Ao Asc diam:  3.50 cm MITRAL VALVE                TRICUSPID VALVE MV Area (PHT): 3.60 cm     TR Peak grad:   26.4 mmHg MV Decel Time: 211 msec     TR Vmax:        257.00 cm/s MR PISA:        0.57 cm MR PISA Radius: 0.30 cm     SHUNTS MV E velocity: 89.40 cm/s   Systemic VTI:  0.24 m MV A velocity: 115.00 cm/s  Systemic Diam: 1.80 cm MV E/A ratio:  0.78 Loralie Champagne MD Electronically signed by Loralie Champagne MD Signature Date/Time: 03/05/2021/8:08:38 PM    Final    VAS [place] LOWER  EXTREMITY VENOUS (DVT)  Result Date: 03/05/2021  Lower Venous DVT Study Patient Name:  Kara Mullins  Date of Exam:   03/05/2021 Medical Rec #: [id number]       Accession #:    [id number] Date of Birth: 12-17-1950      Patient Gender: F Patient Age:   69Y Exam Location:  [facility] Procedure:      VAS [place] LOWER EXTREMITY VENOUS (DVT) Referring Phys: [id number] --------------------------------------------------------------------------------  Indications: Edema.  Comparison Study: No previous exams. Performing Technologist: Rogelia Rohrer  Examination Guidelines: A complete evaluation includes B-mode imaging, spectral Doppler, color Doppler, and power Doppler as needed of all accessible portions of each vessel. Bilateral testing is considered an integral part of a complete examination. Limited examinations for reoccurring indications may be performed as noted. The reflux portion of the exam is performed with the patient in reverse Trendelenburg.  +---------+---------------+---------+-----------+----------+--------------+ RIGHT    CompressibilityPhasicitySpontaneityPropertiesThrombus Aging +---------+---------------+---------+-----------+----------+--------------+ CFV      Full           Yes      Yes                                 +---------+---------------+---------+-----------+----------+--------------+ SFJ      Full                                                        +---------+---------------+---------+-----------+----------+--------------+ FV Prox  Full           Yes      Yes                                 +---------+---------------+---------+-----------+----------+--------------+ FV Mid   Full           Yes      Yes                                 +---------+---------------+---------+-----------+----------+--------------+ FV DistalFull  Yes      Yes                                  +---------+---------------+---------+-----------+----------+--------------+ PFV      Full                                                        +---------+---------------+---------+-----------+----------+--------------+ POP      Full           Yes      Yes                                 +---------+---------------+---------+-----------+----------+--------------+ PTV      Full                                                        +---------+---------------+---------+-----------+----------+--------------+ PERO     Full                                                        +---------+---------------+---------+-----------+----------+--------------+   +---------+---------------+---------+-----------+----------+--------------+ LEFT     CompressibilityPhasicitySpontaneityPropertiesThrombus Aging +---------+---------------+---------+-----------+----------+--------------+ CFV      Full           Yes      Yes                                 +---------+---------------+---------+-----------+----------+--------------+ SFJ      Full                                                        +---------+---------------+---------+-----------+----------+--------------+ FV Prox  Full           Yes      Yes                                 +---------+---------------+---------+-----------+----------+--------------+ FV Mid   Full           Yes      Yes                                 +---------+---------------+---------+-----------+----------+--------------+ FV DistalFull           Yes      Yes                                 +---------+---------------+---------+-----------+----------+--------------+ PFV      Full                                                        +---------+---------------+---------+-----------+----------+--------------+  POP      Full           Yes      Yes                                  +---------+---------------+---------+-----------+----------+--------------+ PTV      Full                                                        +---------+---------------+---------+-----------+----------+--------------+ PERO     Full                                                        +---------+---------------+---------+-----------+----------+--------------+     Summary: BILATERAL: - No evidence of deep vein thrombosis seen in the lower extremities, bilaterally. - No evidence of superficial venous thrombosis in the lower extremities, bilaterally. -No evidence of popliteal cyst, bilaterally. -Significant subcutaneous edema found throughout lower extremities.  *See table(s) above for measurements and observations. Electronically signed by Monica Martinez MD on 03/05/2021 at 3:29:23 PM.    Final       DVT prophylaxis: Lovenox  Code Status: Full code  Family Communication: No family at bedside   Consultants:    Procedures:      Objective    Physical Examination:    General-appears in no acute distress  Heart-S1-S2, regular, no murmur auscultated  Lungs-clear to auscultation bilaterally, no wheezing or crackles auscultated  Abdomen-soft, nontender, no organomegaly  Extremities-1+ edema in the lower extremities  Neuro-alert, oriented x3, no focal deficit noted   Status is: Inpatient  Dispo: The patient is from: Home              Anticipated d/c is to: Home              Anticipated d/c date is: 03/09/2021              Patient currently not stable for discharge  Barrier to discharge-ongoing evaluation for new onset CHF  COVID-19 Labs  Recent Labs    03/04/21 2312  DDIMER 6.36*    Lab Results  Component Value Date   [place] NEGATIVE 03/05/2021    Microbiology  Recent Results (from the past 240 hour(s))  Resp Panel by RT-PCR (Flu A&B, Covid) Nasopharyngeal Swab     Status: None   Collection Time: 03/05/21  4:07 AM   Specimen:  Nasopharyngeal Swab; Nasopharyngeal(NP) swabs in vial transport medium  Result Value Ref Range Status   SARS Coronavirus 2 by RT PCR NEGATIVE NEGATIVE Final    Comment: (NOTE) SARS-CoV-2 target nucleic acids are NOT DETECTED.  The SARS-CoV-2 RNA is generally detectable in upper respiratory specimens during the acute phase of infection. The lowest concentration of SARS-CoV-2 viral copies this assay can detect is 138 copies/mL. A negative result does not preclude SARS-Cov-2 infection and should not be used as the sole basis for treatment or other patient management decisions. A negative result may occur with  improper specimen collection/handling, submission of specimen other than nasopharyngeal swab, presence of viral mutation(s) within the areas targeted by this assay, and inadequate number  of viral copies(<138 copies/mL). A negative result must be combined with clinical observations, patient history, and epidemiological information. The expected result is Negative.  Fact Sheet for Patients:  EntrepreneurPulse.com.au  Fact Sheet for Healthcare Providers:  IncredibleEmployment.be  This test is no t yet approved or cleared by the [place] FDA and  has been authorized for detection and/or diagnosis of SARS-CoV-2 by FDA under an Emergency Use Authorization (EUA). This EUA will remain  in effect (meaning this test can be used) for the duration of the COVID-19 declaration under Section 564(b)(1) of the Act, 21 U.S.C.section 360bbb-3(b)(1), unless the authorization is terminated  or revoked sooner.       Influenza A by PCR NEGATIVE NEGATIVE Final   Influenza B by PCR NEGATIVE NEGATIVE Final    Comment: (NOTE) The Xpert Xpress SARS-CoV-2/FLU/RSV plus assay is intended as an aid in the diagnosis of influenza from Nasopharyngeal swab specimens and should not be used as a sole basis for treatment. Nasal washings and aspirates are unacceptable for  Xpert Xpress SARS-CoV-2/FLU/RSV testing.  Fact Sheet for Patients: EntrepreneurPulse.com.au  Fact Sheet for Healthcare Providers: IncredibleEmployment.be  This test is not yet approved or cleared by the [place] FDA and has been authorized for detection and/or diagnosis of SARS-CoV-2 by FDA under an Emergency Use Authorization (EUA). This EUA will remain in effect (meaning this test can be used) for the duration of the COVID-19 declaration under Section 564(b)(1) of the Act, 21 U.S.C. section 360bbb-3(b)(1), unless the authorization is terminated or revoked.  Performed at [facility], [place] [address], [place], [place] [address]              Oswald Hillock   [facility] If 7PM-7AM, please contact night-coverage at [email or web address], Office  [phone]   03/06/2021, 12:01 PM  LOS: 0 days

## 2021-03-06 NOTE — Progress Notes (Signed)
Cardiology Consultation:   Patient ID: Kara Mullins MRN: [id number]; DOB: Dec 17, 1950  Admit date: 03/04/2021 Date of Consult: 03/06/2021  PCP:  Forrest Moron, MD   [facility] Providers Cardiologist:  None        Patient Profile:   Kara Mullins is a 70 y.o. female with a hx of with a history of hypertension, lymphedema, anxiety who is being seen 03/06/2021 for the evaluation of heart failure at the request of Dr Darrick Meigs.  History of Present Illness:   Kara Mullins is a 70 year old female with a history of hypertension, lymphedema, anxiety who presented to [facility] ED on 5/23 with shortness of breath.  She had noted worsening bilateral lower extremity edema over the last several months.  In addition has been having cough x1 week.  Denies any chest pain.  24 hours prior to presentation began having nausea/vomiting.  On presentation to the ED, vital signs notable for BP 185/95, pulse 77, SPO2 96% on room air.  Labs notable for creatinine 1.29, sodium 137, potassium 3.9, normal LFTs, troponin 29 > 31, hemoglobin 10.1, WBC 5.5, platelets 265, BNP 1197, COVID-19 negative, D-dimer 6.4.  EKG showed normal sinus rhythm, rate 83, poor R wave progression, QTC 509.  Chest x-ray showed pulmonary edema with small to moderate left pleural effusion.  CTPA showed no PE but pulmonary edema with small to moderate right pleural effusion.  Echocardiogram shows LVEF 45%, severe LVH, grade 1 diastolic dysfunction, normal RV function, mild pulmonary hypertension (RVSP 41), mild to moderate MR, RAP 15.  Plan was to diurese with IV Lasix, 40 mg twice daily was ordered but appears has only received 1 dose of IV Lasix 40 mg yesterday.  Creatinine has increased (1.29 > 1.35 > 1.59).  Weights are not recorded.   Past Medical History:  Diagnosis Date  . Hypertension     History reviewed. No pertinent surgical history.    Inpatient Medications: Scheduled Meds: . carvedilol  12.5 mg Oral BID WC  . enoxaparin  (LOVENOX) injection  40 mg Subcutaneous Q24H  . escitalopram  10 mg Oral Daily  . famotidine  20 mg Oral BID   Continuous Infusions: . promethazine (PHENERGAN) injection (IM or IVPB) 12.5 mg (03/06/21 0413)   PRN Meds: acetaminophen **OR** acetaminophen, hydrALAZINE, ondansetron **OR** ondansetron (ZOFRAN) IV, polyethylene glycol, promethazine (PHENERGAN) injection (IM or IVPB)  Allergies:   No Known Allergies  Social History:   Social History   Socioeconomic History  . Marital status: Divorced    Spouse name: Not on file  . Number of children: Not on file  . Years of education: Not on file  . Highest education level: Not on file  Occupational History  . Not on file  Tobacco Use  . Smoking status: Never Smoker  . Smokeless tobacco: Never Used  Vaping Use  . Vaping Use: Never used  Substance and Sexual Activity  . Alcohol use: Not Currently  . Drug use: Never  . Sexual activity: Not on file  Other Topics Concern  . Not on file  Social History Narrative  . Not on file   Social Determinants of Health   Financial Resource Strain: Not on file  Food Insecurity: Not on file  Transportation Needs: Not on file  Physical Activity: Not on file  Stress: Not on file  Social Connections: Not on file  Intimate Partner Violence: Not on file    Family History:    Family History  Family history unknown: Yes  ROS:  Please see the history of present illness.   All other ROS reviewed and negative.     Physical Exam/Data:   Vitals:   03/05/21 1755 03/05/21 1832 03/05/21 2054 03/06/21 0435  BP: (!) 199/99 126/67 128/66 (!) 163/81  Pulse: 73 75 66 73  Resp: '20 19 18 18  '$ Temp: 97.8 F (36.6 C)  98 F (36.7 C) 97.7 F (36.5 C)  TempSrc:    Oral  SpO2: 100% 99% 97% 96%    Intake/Output Summary (Last 24 hours) at 03/06/2021 1219 Last data filed at 03/06/2021 0900 Gross per 24 hour  Intake 253.33 ml  Output --  Net 253.33 ml   No flowsheet data found.   There is  no height or weight on file to calculate BMI.  General:  in no acute distress HEENT: normal Neck:+ JVD Cardiac:  normal S1, S2; RRR; no murmur  Lungs:  Diminished breath sounds right base.  Bibasilar crackles Abd: soft, nontender Ext: 2+ BLE edema Skin: warm and dry  Neuro:  no focal abnormalities noted Psych:  Normal affect   EKG:  The EKG was personally reviewed and demonstrates:  normal sinus rhythm, rate 83, poor R wave progression, QTC 509 Telemetry:  Telemetry was personally reviewed and demonstrates:  NSR 60-70s, PVCs  Relevant CV Studies: Echo 5/23: 1. Left ventricular ejection fraction, by estimation, is 45%. The left  ventricle has mildly decreased function. The left ventricle demonstrates  global hypokinesis. There is severe left ventricular hypertrophy. Left  ventricular diastolic parameters are  consistent with Grade I diastolic dysfunction (impaired relaxation). Would  consider cardiac amyloidosis.  2. Right ventricular systolic function is normal. The right ventricular  size is normal. There is mildly elevated pulmonary artery systolic  pressure. The estimated right ventricular systolic pressure is 123456 mmHg.  3. Left atrial size was severely dilated.  4. The mitral valve is normal in structure. Mild to moderate mitral valve  regurgitation. No evidence of mitral stenosis.  5. The aortic valve is tricuspid. Aortic valve regurgitation is trivial.  No aortic stenosis is present.  6. Aortic dilatation noted. There is mild dilatation of the aortic root,  measuring 37 mm.  7. The inferior vena cava is dilated in size with <50% respiratory  variability, suggesting right atrial pressure of 15 mmHg.  8. There is a left pleural effusion and trivial pericardial effusion.   Laboratory Data:  High Sensitivity Troponin:   Recent Labs  Lab 03/04/21 0053 03/04/21 2144  TROPONINIHS 29* 31*     Chemistry Recent Labs  Lab 03/04/21 2144 03/05/21 0842  03/06/21 0445  NA 137 138 141  K 3.9 4.0 3.9  CL 105 107 110  CO2 '25 24 24  '$ GLUCOSE 127* 130* 112*  BUN 26* 27* 30*  CREATININE 1.29* 1.35* 1.59*  CALCIUM 8.6* 8.3* 8.4*  GFRNONAA 45* 43* 35*  ANIONGAP '7 7 7    '$ Recent Labs  Lab 03/04/21 2144 03/05/21 0842 03/06/21 0445  PROT 7.4 6.1* 5.8*  ALBUMIN 3.1* 2.6* 2.5*  AST '29 28 24  '$ ALT '17 14 14  '$ ALKPHOS 107 91 82  BILITOT 0.7 0.5 0.7   Hematology Recent Labs  Lab 03/04/21 2144 03/05/21 0842 03/06/21 0445  WBC 5.5 5.3 6.1  RBC 3.84* 3.51* 3.31*  HGB 10.1* 9.5* 8.8*  HCT 32.9* 29.7* 28.3*  MCV 85.7 84.6 85.5  MCH 26.3 27.1 26.6  MCHC 30.7 32.0 31.1  RDW 14.7 14.9 14.9  PLT 265 234 234  BNP Recent Labs  Lab 03/04/21 2258  BNP 1,196.9*    DDimer  Recent Labs  Lab 03/04/21 2312  DDIMER 6.36*     Radiology/Studies:  DG Chest 2 View  Result Date: 03/04/2021 CLINICAL DATA:  Hypertension. EXAM: CHEST - 2 VIEW COMPARISON:  None. FINDINGS: The heart size and mediastinal contours are within normal limits. Aortic arch calcifications. Streaky bibasilar airspace opacities. Increased interstitial markings. Trace right volume and small to moderate left volume pleural effusions. No pneumothorax. No acute osseous abnormality. IMPRESSION: Pulmonary edema with trace right and small to moderate left volume pleural effusions. Superimposed infection/inflammation not excluded. Followup PA and lateral chest X-ray is recommended in 3-4 weeks following therapy to ensure resolution. Electronically Signed   By: Iven Finn M.D.   On: 03/04/2021 22:40   CT ANGIO CHEST PE W OR WO CONTRAST  Result Date: 03/05/2021 CLINICAL DATA:  Pt presents from home with daughter, c/o n/v and SOB starting this evening. Denies CP, reports some abd discomfort. Last BM today. States she has not had home BP meds today. EXAM: CT ANGIOGRAPHY CHEST WITH CONTRAST TECHNIQUE: Multidetector CT imaging of the chest was performed using the standard protocol during  bolus administration of intravenous contrast. Multiplanar CT image reconstructions and MIPs were obtained to evaluate the vascular anatomy. CONTRAST:  124m OMNIPAQUE IOHEXOL 350 MG/ML SOLN COMPARISON:  Chest x-ray 03/04/2021 FINDINGS: Cardiovascular: Satisfactory opacification of the pulmonary arteries to the segmental level. No evidence of pulmonary embolism. Normal heart size. No pericardial effusion. Aorta atherosclerotic plaque. Mediastinum/Nodes: No enlarged mediastinal, hilar, or axillary lymph nodes. Thyroid gland, trachea, and esophagus demonstrate no significant findings. Lungs/Pleura: Interlobular septal wall thickening. Left upper lobe atelectasis. Passive atelectasis bilateral lower lobes. No focal consolidation. Few scattered pulmonary micronodules. No pulmonary mass. Trace to small volume left and small to moderate volume right pleural effusions. No pneumothorax. Upper Abdomen: No acute abnormality. Musculoskeletal: No abdominal wall hernia or abnormality. No suspicious lytic or blastic osseous lesions. No acute displaced fracture. Multilevel degenerative changes of the spine. Levoscoliosis of the thoracolumbar spine. Review of the MIP images confirms the above findings. IMPRESSION: 1. No pulmonary embolus. 2. Pulmonary edema with trace to small volume left and small to moderate volume right pleural effusions. 3.  Aortic Atherosclerosis (ICD10-I70.0). Electronically Signed   By: MIven FinnM.D.   On: 03/05/2021 05:36   DG Abd 2 Views  Result Date: 03/05/2021 CLINICAL DATA:  Nausea vomiting, mid abdominal discomfort since yesterday EXAM: ABDOMEN - 2 VIEW COMPARISON:  None. FINDINGS: The bowel gas pattern is normal. There is no evidence of free air. No radio-opaque calculi or other significant radiographic abnormality is seen. IMPRESSION: No evidence of bowel obstruction. Electronically Signed   By: JMaurine Simmering  On: 03/05/2021 09:52   ECHOCARDIOGRAM COMPLETE  Result Date: 03/05/2021     ECHOCARDIOGRAM REPORT   Patient Name:   Kara WESSLERDate of Exam: 03/05/2021 Medical Rec #:  [id number]     Height:       60.0 in Accession #:    [id number]    Weight:       160.0 lb Date of Birth:  [birth year]    BSA:          1.698 m Patient Age:    89years       BP:           182/85 mmHg Patient Gender: F  HR:           73 bpm. Exam Location:  Inpatient Procedure: 2D Echo, Cardiac Doppler and Color Doppler Indications:    I50.40* Unspecified combined systolic (congestive) and diastolic                 (congestive) heart failure  History:        Patient has no prior history of Echocardiogram examinations.                 Signs/Symptoms:Chest Pain. Elevated troponin. Elevated d dimer.  Sonographer:    Roseanna Rainbow RDCS Referring Phys: [id number] [place]  1. Left ventricular ejection fraction, by estimation, is 45%. The left ventricle has mildly decreased function. The left ventricle demonstrates global hypokinesis. There is severe left ventricular hypertrophy. Left ventricular diastolic parameters are consistent with Grade I diastolic dysfunction (impaired relaxation). Would consider cardiac amyloidosis.  2. Right ventricular systolic function is normal. The right ventricular size is normal. There is mildly elevated pulmonary artery systolic pressure. The estimated right ventricular systolic pressure is 123456 mmHg.  3. Left atrial size was severely dilated.  4. The mitral valve is normal in structure. Mild to moderate mitral valve regurgitation. No evidence of mitral stenosis.  5. The aortic valve is tricuspid. Aortic valve regurgitation is trivial. No aortic stenosis is present.  6. Aortic dilatation noted. There is mild dilatation of the aortic root, measuring 37 mm.  7. The inferior vena cava is dilated in size with <50% respiratory variability, suggesting right atrial pressure of 15 mmHg.  8. There is a left pleural effusion and trivial pericardial effusion. FINDINGS  Left  Ventricle: Left ventricular ejection fraction, by estimation, is 45%. The left ventricle has mildly decreased function. The left ventricle demonstrates global hypokinesis. The left ventricular internal cavity size was normal in size. There is severe left ventricular hypertrophy. Left ventricular diastolic parameters are consistent with Grade I diastolic dysfunction (impaired relaxation). Right Ventricle: The right ventricular size is normal. No increase in right ventricular wall thickness. Right ventricular systolic function is normal. There is mildly elevated pulmonary artery systolic pressure. The tricuspid regurgitant velocity is 2.57  m/s, and with an assumed right atrial pressure of 15 mmHg, the estimated right ventricular systolic pressure is 123456 mmHg. Left Atrium: Left atrial size was severely dilated. Right Atrium: Right atrial size was normal in size. Pericardium: There is a left pleural effusion. Trivial pericardial effusion is present. Mitral Valve: The mitral valve is normal in structure. Mild mitral annular calcification. Mild to moderate mitral valve regurgitation. No evidence of mitral valve stenosis. Tricuspid Valve: The tricuspid valve is normal in structure. Tricuspid valve regurgitation is trivial. Aortic Valve: The aortic valve is tricuspid. Aortic valve regurgitation is trivial. No aortic stenosis is present. Pulmonic Valve: The pulmonic valve was normal in structure. Pulmonic valve regurgitation is trivial. Aorta: Aortic dilatation noted. There is mild dilatation of the aortic root, measuring 37 mm. Venous: The inferior vena cava is dilated in size with less than 50% respiratory variability, suggesting right atrial pressure of 15 mmHg. IAS/Shunts: No atrial level shunt detected by color flow Doppler.  LEFT VENTRICLE PLAX 2D LVIDd:         5.02 cm      Diastology LVIDs:         3.73 cm      LV e' medial:    4.38 cm/s LV PW:         1.86 cm  LV E/e' medial:  20.4 LV IVS:        1.53 cm       LV e' lateral:   5.51 cm/s LVOT diam:     1.80 cm      LV E/e' lateral: 16.2 LV SV:         62 LV SV Index:   37 LVOT Area:     2.54 cm  LV Volumes (MOD) LV vol d, MOD A2C: 88.2 ml LV vol d, MOD A4C: 113.0 ml LV vol s, MOD A2C: 46.4 ml LV vol s, MOD A4C: 55.5 ml LV SV MOD A2C:     41.8 ml LV SV MOD A4C:     113.0 ml LV SV MOD BP:      50.2 ml RIGHT VENTRICLE             IVC RV S prime:     14.10 cm/s  IVC diam: 2.58 cm TAPSE (M-mode): 2.3 cm LEFT ATRIUM             Index       RIGHT ATRIUM           Index LA diam:        3.30 cm 1.94 cm/m  RA Area:     13.60 cm LA Vol (A2C):   85.4 ml 50.30 ml/m RA Volume:   28.80 ml  16.96 ml/m LA Vol (A4C):   99.1 ml 58.37 ml/m LA Biplane Vol: 97.9 ml 57.67 ml/m  AORTIC VALVE LVOT Vmax:   110.00 cm/s LVOT Vmean:  80.500 cm/s LVOT VTI:    0.245 m  AORTA Ao Root diam: 3.70 cm Ao Asc diam:  3.50 cm MITRAL VALVE                TRICUSPID VALVE MV Area (PHT): 3.60 cm     TR Peak grad:   26.4 mmHg MV Decel Time: 211 msec     TR Vmax:        257.00 cm/s MR PISA:        0.57 cm MR PISA Radius: 0.30 cm     SHUNTS MV E velocity: 89.40 cm/s   Systemic VTI:  0.24 m MV A velocity: 115.00 cm/s  Systemic Diam: 1.80 cm MV E/A ratio:  0.78 Kara Champagne MD Electronically signed by Kara Champagne MD Signature Date/Time: 03/05/2021/8:08:38 PM    Final    VAS [place] LOWER EXTREMITY VENOUS (DVT)  Result Date: 03/05/2021  Lower Venous DVT Study Patient Name:  Kara Mullins  Date of Exam:   03/05/2021 Medical Rec #: [id number]       Accession #:    [id number] Date of Birth: 1951-06-30      Patient Gender: F Patient Age:   69Y Exam Location:  [facility] Procedure:      VAS [place] LOWER EXTREMITY VENOUS (DVT) Referring Phys: [id number] [place] --------------------------------------------------------------------------------  Indications: Edema.  Comparison Study: No previous exams. Performing Technologist: Rogelia Rohrer  Examination Guidelines: A complete evaluation includes B-mode imaging,  spectral Doppler, color Doppler, and power Doppler as needed of all accessible portions of each vessel. Bilateral testing is considered an integral part of a complete examination. Limited examinations for reoccurring indications may be performed as noted. The reflux portion of the exam is performed with the patient in reverse Trendelenburg.  +---------+---------------+---------+-----------+----------+--------------+ RIGHT    CompressibilityPhasicitySpontaneityPropertiesThrombus Aging +---------+---------------+---------+-----------+----------+--------------+ CFV      Full           Yes  Yes                                 +---------+---------------+---------+-----------+----------+--------------+ SFJ      Full                                                        +---------+---------------+---------+-----------+----------+--------------+ FV Prox  Full           Yes      Yes                                 +---------+---------------+---------+-----------+----------+--------------+ FV Mid   Full           Yes      Yes                                 +---------+---------------+---------+-----------+----------+--------------+ FV DistalFull           Yes      Yes                                 +---------+---------------+---------+-----------+----------+--------------+ PFV      Full                                                        +---------+---------------+---------+-----------+----------+--------------+ POP      Full           Yes      Yes                                 +---------+---------------+---------+-----------+----------+--------------+ PTV      Full                                                        +---------+---------------+---------+-----------+----------+--------------+ PERO     Full                                                        +---------+---------------+---------+-----------+----------+--------------+    +---------+---------------+---------+-----------+----------+--------------+ LEFT     CompressibilityPhasicitySpontaneityPropertiesThrombus Aging +---------+---------------+---------+-----------+----------+--------------+ CFV      Full           Yes      Yes                                 +---------+---------------+---------+-----------+----------+--------------+ SFJ      Full                                                        +---------+---------------+---------+-----------+----------+--------------+  FV Prox  Full           Yes      Yes                                 +---------+---------------+---------+-----------+----------+--------------+ FV Mid   Full           Yes      Yes                                 +---------+---------------+---------+-----------+----------+--------------+ FV DistalFull           Yes      Yes                                 +---------+---------------+---------+-----------+----------+--------------+ PFV      Full                                                        +---------+---------------+---------+-----------+----------+--------------+ POP      Full           Yes      Yes                                 +---------+---------------+---------+-----------+----------+--------------+ PTV      Full                                                        +---------+---------------+---------+-----------+----------+--------------+ PERO     Full                                                        +---------+---------------+---------+-----------+----------+--------------+     Summary: BILATERAL: - No evidence of deep vein thrombosis seen in the lower extremities, bilaterally. - No evidence of superficial venous thrombosis in the lower extremities, bilaterally. -No evidence of popliteal cyst, bilaterally. -Significant subcutaneous edema found throughout lower extremities.  *See table(s) above for measurements and  observations. Electronically signed by Monica Martinez MD on 03/05/2021 at 3:29:23 PM.    Final      Assessment and Plan:    Acute on chronic combined systolic and diastolic heart failure: Echocardiogram shows EF 45%, new diagnosis systolic dysfunction.  Presentation is concerning for cardiac amyloidosis with severe LVH on echo but relatively low voltage on EKG -Check SPEP, UPEP, light chains -Cardiac MRI -Continue carvedilol 12.5 mg twice daily -Plan to add losartan once renal function stable -Real function has worsened but has not been diuresed (received 1 dose of IV lasix on 03/05/21 at 450am, none since).  Appears volume overloaded on exam, BNP 1197, chest CT with pulmonary edema/effusions, RAP 52mHg on echo.  Recommend trial of diuresis. Would start IV lasix 80 mg BID and closely monitor renal function.   -Strict I/Os,  daily weights  Hypertension: BP has been significantly elevated, up to 230/ 100s this admission, though improved to 120/60s this morning. On HCTZ at home. -Continue carvedilol, plan to add losartan once renal function stable as above  For questions or updates, please contact [place] Please consult [email or web address] for contact info under    Signed, Donato Heinz, MD  03/06/2021 12:19 PM

## 2021-03-07 ENCOUNTER — Ambulatory Visit (HOSPITAL_COMMUNITY): Admit: 2021-03-07 | Payer: Medicare Other

## 2021-03-07 ENCOUNTER — Inpatient Hospital Stay (HOSPITAL_COMMUNITY): Payer: Medicare Other

## 2021-03-07 DIAGNOSIS — I10 Essential (primary) hypertension: Secondary | ICD-10-CM | POA: Diagnosis not present

## 2021-03-07 DIAGNOSIS — I5043 Acute on chronic combined systolic (congestive) and diastolic (congestive) heart failure: Secondary | ICD-10-CM | POA: Diagnosis not present

## 2021-03-07 DIAGNOSIS — I5041 Acute combined systolic (congestive) and diastolic (congestive) heart failure: Secondary | ICD-10-CM | POA: Diagnosis not present

## 2021-03-07 LAB — BASIC METABOLIC PANEL
Anion gap: 7 (ref 5–15)
BUN: 33 mg/dL — ABNORMAL HIGH (ref 8–23)
CO2: 28 mmol/L (ref 22–32)
Calcium: 8.2 mg/dL — ABNORMAL LOW (ref 8.9–10.3)
Chloride: 106 mmol/L (ref 98–111)
Creatinine, Ser: 1.92 mg/dL — ABNORMAL HIGH (ref 0.44–1.00)
GFR, Estimated: 28 mL/min — ABNORMAL LOW (ref >60)
Glucose, Bld: 99 mg/dL (ref 70–99)
Potassium: 3.6 mmol/L (ref 3.5–5.1)
Sodium: 141 mmol/L (ref 135–145)

## 2021-03-07 NOTE — Plan of Care (Signed)
  Problem: Health Behavior/Discharge Planning: Goal: Ability to manage health-related needs will improve Outcome: Progressing   Problem: Clinical Measurements: Goal: Ability to maintain clinical measurements within normal limits will improve Outcome: Progressing   Problem: Clinical Measurements: Goal: Will remain free from infection Outcome: Progressing   Problem: Clinical Measurements: Goal: Respiratory complications will improve Outcome: Progressing   Problem: Activity: Goal: Risk for activity intolerance will decrease Outcome: Progressing   Problem: Nutrition: Goal: Adequate nutrition will be maintained Outcome: Progressing   Problem: Pain Managment: Goal: General experience of comfort will improve Outcome: Progressing

## 2021-03-07 NOTE — Progress Notes (Signed)
PROGRESS NOTE    Kara Mullins  [id number] DOB: May 03, 1951 DOA: 03/04/2021 PCP: Forrest Moron, MD   Brief Narrative:  70 year old female with past medical history of hypertension, lymphedema, anxiety disorder who presented to the ED with multiple complaints including shortness of breath, cough, peripheral edema, nausea and vomiting. Patient also has been experiencing several month history of increasing bilateral lower extremity edema. Over the past 24 hours she developed nausea and frequent bouts of vomiting.  Patient was admitted with acute combined CHF exacerbation, intractable nausea and vomiting.  Cardiology consulted.  SBO ruled out.  Assessment & Plan:   Principal Problem:   Acute CHF (congestive heart failure) (HCC) Active Problems:   Hypertensive urgency   Nausea and vomiting   Elevated troponin level not due myocardial infarction   Elevated d-dimer   Generalized anxiety disorder   Acute congestive heart failure (HCC)   CHF exacerbation (HCC)   Essential hypertension   New/acute combined systolic and diastolic congestive heart failure: -echocardiogram showed global hypokinesis of left ventricle, EF [id number], grade 1 diastolic dysfunction.  Consider cardiac amyloidosis. she feels better today.  Not hypoxic.  Very faint crackles at right base.  Remains on Lasix 80 mg IV twice daily.  Cardiology on board.  They suspect cardiac amyloidosis and they have initiated the work-up.  Patient to go to [facility] to get cardiac MRI today.  Renal function declining, will defer to cardiology about managing diuretics.  Acute kidney injury: 1.29> 1.35> 1.59> 1.92.  Patient claims that she has been having good urine output, she just urinated in the toilet instead of in the urinal so no output is charted.  I advised her to make sure to call nurses every time she has to urinate so charting can be done accurately.  She is not on any other nephrotoxic except Lasix.  Will defer to cardiology about  Lasix dosage.  Will order ultrasound renal.  Nausea vomiting: Symptoms resolved.  Abdominal x-ray negative for SBO.  Essential hypertension: Blood pressure fairly controlled.  Continue Coreg.  Elevated D-dimer: Ruled out of PE and DVT.  DVT prophylaxis: enoxaparin (LOVENOX) injection 40 mg Start: 03/05/21 1000   Code Status: Full Code  Family Communication: Daughter and her husband present at bedside.  Plan of care discussed with all of them and patient.  Status is: Inpatient  Remains inpatient appropriate because:Inpatient level of care appropriate due to severity of illness   Dispo: The patient is from: Home              Anticipated d/c is to: Home              Patient currently is not medically stable to d/c.   Difficult to place patient No        There is no height or weight on file to calculate BMI.      Nutritional status:               Consultants:   Cardiology  Procedures:   Plan  Antimicrobials:  Anti-infectives (From admission, onward)   None         Subjective: Patient seen and examined.  Daughter and her husband at the bedside.  Patient states that she feels better.  No shortness of breath or any other complaint.  Objective: Vitals:   03/06/21 2051 03/07/21 0541 03/07/21 0754 03/07/21 1000  BP: (!) 162/82 (!) 173/81 (!) 168/93 (!) 155/79  Pulse: 63 66 69 62  Resp: 18 16  Temp: 98 F (36.7 C) 98.3 F (36.8 C)    TempSrc: Oral Oral    SpO2: 96% 96%    Weight:  81.6 kg      Intake/Output Summary (Last 24 hours) at 03/07/2021 1047 Last data filed at 03/07/2021 0900 Gross per 24 hour  Intake 170 ml  Output 200 ml  Net -30 ml   Filed Weights   03/07/21 0541  Weight: 81.6 kg    Examination:  General exam: Appears calm and comfortable  Respiratory system: Fine crackles at right base. Respiratory effort normal. Cardiovascular system: S1 & S2 heard, RRR. No JVD, murmurs, rubs, gallops or clicks.  +3 pitting edema  bilateral lower extremity Gastrointestinal system: Abdomen is nondistended, soft and nontender. No organomegaly or masses felt. Normal bowel sounds heard. Central nervous system: Alert and oriented. No focal neurological deficits. Extremities: Symmetric 5 x 5 power. Skin: No rashes, lesions or ulcers Psychiatry: Judgement and insight appear normal. Mood & affect appropriate.    Data Reviewed: I have personally reviewed following labs and imaging studies  CBC: Recent Labs  Lab 03/04/21 2144 03/05/21 0842 03/06/21 0445  WBC 5.5 5.3 6.1  NEUTROABS 4.7 4.2  --   HGB 10.1* 9.5* 8.8*  HCT 32.9* 29.7* 28.3*  MCV 85.7 84.6 85.5  PLT 265 234 [id number]   Basic Metabolic Panel: Recent Labs  Lab 03/04/21 2144 03/05/21 0842 03/06/21 0445 03/07/21 0441  NA 137 138 141 141  K 3.9 4.0 3.9 3.6  CL 105 107 110 106  CO2 '25 24 24 28  '$ GLUCOSE 127* 130* 112* 99  BUN 26* 27* 30* 33*  CREATININE 1.29* 1.35* 1.59* 1.92*  CALCIUM 8.6* 8.3* 8.4* 8.2*  MG  --  2.3  --   --    GFR: CrCl cannot be calculated (Unknown ideal weight.). Liver Function Tests: Recent Labs  Lab 03/04/21 2144 03/05/21 0842 03/06/21 0445  AST '29 28 24  '$ ALT '17 14 14  '$ ALKPHOS 107 91 82  BILITOT 0.7 0.5 0.7  PROT 7.4 6.1* 5.8*  ALBUMIN 3.1* 2.6* 2.5*   Recent Labs  Lab 03/04/21 2144  LIPASE 22   No results for input(s): AMMONIA in the last 168 hours. Coagulation Profile: No results for input(s): INR, PROTIME in the last 168 hours. Cardiac Enzymes: No results for input(s): CKTOTAL, CKMB, CKMBINDEX, TROPONINI in the last 168 hours. BNP (last 3 results) No results for input(s): PROBNP in the last 8760 hours. HbA1C: No results for input(s): HGBA1C in the last 72 hours. CBG: No results for input(s): GLUCAP in the last 168 hours. Lipid Profile: No results for input(s): CHOL, HDL, LDLCALC, TRIG, CHOLHDL, LDLDIRECT in the last 72 hours. Thyroid Function Tests: No results for input(s): TSH, T4TOTAL, FREET4, T3FREE,  THYROIDAB in the last 72 hours. Anemia Panel: No results for input(s): VITAMINB12, FOLATE, FERRITIN, TIBC, IRON, RETICCTPCT in the last 72 hours. Sepsis Labs: No results for input(s): PROCALCITON, LATICACIDVEN in the last 168 hours.  Recent Results (from the past 240 hour(s))  Resp Panel by RT-PCR (Flu A&B, Covid) Nasopharyngeal Swab     Status: None   Collection Time: 03/05/21  4:07 AM   Specimen: Nasopharyngeal Swab; Nasopharyngeal(NP) swabs in vial transport medium  Result Value Ref Range Status   SARS Coronavirus 2 by RT PCR NEGATIVE NEGATIVE Final    Comment: (NOTE) SARS-CoV-2 target nucleic acids are NOT DETECTED.  The SARS-CoV-2 RNA is generally detectable in upper respiratory specimens during the acute phase of infection. The lowest concentration  of SARS-CoV-2 viral copies this assay can detect is 138 copies/mL. A negative result does not preclude SARS-Cov-2 infection and should not be used as the sole basis for treatment or other patient management decisions. A negative result may occur with  improper specimen collection/handling, submission of specimen other than nasopharyngeal swab, presence of viral mutation(s) within the areas targeted by this assay, and inadequate number of viral copies(<138 copies/mL). A negative result must be combined with clinical observations, patient history, and epidemiological information. The expected result is Negative.  Fact Sheet for Patients:  EntrepreneurPulse.com.au  Fact Sheet for Healthcare Providers:  IncredibleEmployment.be  This test is no t yet approved or cleared by the [place] FDA and  has been authorized for detection and/or diagnosis of SARS-CoV-2 by FDA under an Emergency Use Authorization (EUA). This EUA will remain  in effect (meaning this test can be used) for the duration of the COVID-19 declaration under Section 564(b)(1) of the Act, 21 U.S.C.section 360bbb-3(b)(1), unless the  authorization is terminated  or revoked sooner.       Influenza A by PCR NEGATIVE NEGATIVE Final   Influenza B by PCR NEGATIVE NEGATIVE Final    Comment: (NOTE) The Xpert Xpress SARS-CoV-2/FLU/RSV plus assay is intended as an aid in the diagnosis of influenza from Nasopharyngeal swab specimens and should not be used as a sole basis for treatment. Nasal washings and aspirates are unacceptable for Xpert Xpress SARS-CoV-2/FLU/RSV testing.  Fact Sheet for Patients: EntrepreneurPulse.com.au  Fact Sheet for Healthcare Providers: IncredibleEmployment.be  This test is not yet approved or cleared by the [place] FDA and has been authorized for detection and/or diagnosis of SARS-CoV-2 by FDA under an Emergency Use Authorization (EUA). This EUA will remain in effect (meaning this test can be used) for the duration of the COVID-19 declaration under Section 564(b)(1) of the Act, 21 U.S.C. section 360bbb-3(b)(1), unless the authorization is terminated or revoked.  Performed at [facility], [place] [address], [place], [place] [address]       Radiology Studies: ECHOCARDIOGRAM COMPLETE  Result Date: 03/05/2021    ECHOCARDIOGRAM REPORT   Patient Name:   DARIEL TEIXEIRA Date of Exam: 03/05/2021 Medical Rec #:  [id number]      Height:       60.0 in Accession #:    [id number]     Weight:       160.0 lb Date of Birth:  Feb 16, 1951     BSA:          1.698 m Patient Age:    47 years       BP:           182/85 mmHg Patient Gender: F              HR:           73 bpm. Exam Location:  Inpatient Procedure: 2D Echo, Cardiac Doppler and Color Doppler Indications:    I50.40* Unspecified combined systolic (congestive) and diastolic                 (congestive) heart failure  History:        Patient has no prior history of Echocardiogram examinations.                 Signs/Symptoms:Chest Pain. Elevated troponin. Elevated d dimer.  Sonographer:    Roseanna Rainbow  RDCS Referring Phys: [id number] [place]  1. Left ventricular ejection fraction, by estimation, is 45%. The left ventricle has mildly decreased  function. The left ventricle demonstrates global hypokinesis. There is severe left ventricular hypertrophy. Left ventricular diastolic parameters are consistent with Grade I diastolic dysfunction (impaired relaxation). Would consider cardiac amyloidosis.  2. Right ventricular systolic function is normal. The right ventricular size is normal. There is mildly elevated pulmonary artery systolic pressure. The estimated right ventricular systolic pressure is 123456 mmHg.  3. Left atrial size was severely dilated.  4. The mitral valve is normal in structure. Mild to moderate mitral valve regurgitation. No evidence of mitral stenosis.  5. The aortic valve is tricuspid. Aortic valve regurgitation is trivial. No aortic stenosis is present.  6. Aortic dilatation noted. There is mild dilatation of the aortic root, measuring 37 mm.  7. The inferior vena cava is dilated in size with <50% respiratory variability, suggesting right atrial pressure of 15 mmHg.  8. There is a left pleural effusion and trivial pericardial effusion. FINDINGS  Left Ventricle: Left ventricular ejection fraction, by estimation, is 45%. The left ventricle has mildly decreased function. The left ventricle demonstrates global hypokinesis. The left ventricular internal cavity size was normal in size. There is severe left ventricular hypertrophy. Left ventricular diastolic parameters are consistent with Grade I diastolic dysfunction (impaired relaxation). Right Ventricle: The right ventricular size is normal. No increase in right ventricular wall thickness. Right ventricular systolic function is normal. There is mildly elevated pulmonary artery systolic pressure. The tricuspid regurgitant velocity is 2.57  m/s, and with an assumed right atrial pressure of 15 mmHg, the estimated right ventricular  systolic pressure is 123456 mmHg. Left Atrium: Left atrial size was severely dilated. Right Atrium: Right atrial size was normal in size. Pericardium: There is a left pleural effusion. Trivial pericardial effusion is present. Mitral Valve: The mitral valve is normal in structure. Mild mitral annular calcification. Mild to moderate mitral valve regurgitation. No evidence of mitral valve stenosis. Tricuspid Valve: The tricuspid valve is normal in structure. Tricuspid valve regurgitation is trivial. Aortic Valve: The aortic valve is tricuspid. Aortic valve regurgitation is trivial. No aortic stenosis is present. Pulmonic Valve: The pulmonic valve was normal in structure. Pulmonic valve regurgitation is trivial. Aorta: Aortic dilatation noted. There is mild dilatation of the aortic root, measuring 37 mm. Venous: The inferior vena cava is dilated in size with less than 50% respiratory variability, suggesting right atrial pressure of 15 mmHg. IAS/Shunts: No atrial level shunt detected by color flow Doppler.  LEFT VENTRICLE PLAX 2D LVIDd:         5.02 cm      Diastology LVIDs:         3.73 cm      LV e' medial:    4.38 cm/s LV PW:         1.86 cm      LV E/e' medial:  20.4 LV IVS:        1.53 cm      LV e' lateral:   5.51 cm/s LVOT diam:     1.80 cm      LV E/e' lateral: 16.2 LV SV:         62 LV SV Index:   37 LVOT Area:     2.54 cm  LV Volumes (MOD) LV vol d, MOD A2C: 88.2 ml LV vol d, MOD A4C: 113.0 ml LV vol s, MOD A2C: 46.4 ml LV vol s, MOD A4C: 55.5 ml LV SV MOD A2C:     41.8 ml LV SV MOD A4C:     113.0 ml LV SV MOD  BP:      50.2 ml RIGHT VENTRICLE             IVC RV S prime:     14.10 cm/s  IVC diam: 2.58 cm TAPSE (M-mode): 2.3 cm LEFT ATRIUM             Index       RIGHT ATRIUM           Index LA diam:        3.30 cm 1.94 cm/m  RA Area:     13.60 cm LA Vol (A2C):   85.4 ml 50.30 ml/m RA Volume:   28.80 ml  16.96 ml/m LA Vol (A4C):   99.1 ml 58.37 ml/m LA Biplane Vol: 97.9 ml 57.67 ml/m  AORTIC VALVE LVOT Vmax:    110.00 cm/s LVOT Vmean:  80.500 cm/s LVOT VTI:    0.245 m  AORTA Ao Root diam: 3.70 cm Ao Asc diam:  3.50 cm MITRAL VALVE                TRICUSPID VALVE MV Area (PHT): 3.60 cm     TR Peak grad:   26.4 mmHg MV Decel Time: 211 msec     TR Vmax:        257.00 cm/s MR PISA:        0.57 cm MR PISA Radius: 0.30 cm     SHUNTS MV E velocity: 89.40 cm/s   Systemic VTI:  0.24 m MV A velocity: 115.00 cm/s  Systemic Diam: 1.80 cm MV E/A ratio:  0.78 Loralie Champagne MD Electronically signed by Loralie Champagne MD Signature Date/Time: 03/05/2021/8:08:38 PM    Final     Scheduled Meds: . carvedilol  12.5 mg Oral BID WC  . enoxaparin (LOVENOX) injection  40 mg Subcutaneous Q24H  . escitalopram  10 mg Oral Daily  . famotidine  20 mg Oral BID   Continuous Infusions: . promethazine (PHENERGAN) injection (IM or IVPB) 12.5 mg (03/06/21 1306)     LOS: 1 day   Time spent: 35 minutes   Darliss Cheney, MD [facility]  03/07/2021, 10:47 AM   How to contact the [facility] Attending or Consulting provider [place] or covering provider during after hours [place], for this patient?  1. Check the care team in [facility] and look for a) attending/consulting [facility] provider listed and b) the [facility] team listed. Page or secure chat [phone]. 2. Log into [email or web address] and use [place]'s universal password to access. If you do not have the password, please contact the hospital operator. 3. Locate the [facility] provider you are looking for under [facility] and page to a number that you can be directly reached. 4. If you still have difficulty reaching the provider, please page the [facility] (Director on Call) for the Hospitalists listed on amion for assistance.

## 2021-03-07 NOTE — Progress Notes (Signed)
[facility] called and said patient's MRI will not be done today D/T creatinine level, patient/family and Dr. Doristine Bosworth made aware.

## 2021-03-07 NOTE — Progress Notes (Signed)
Progress Note  Patient Name: Kara Mullins Date of Encounter: 03/07/2021  [facility] Cardiologist: None   Subjective   Worsening kidney function (creatinine 1.3 > 1.4 > 1.6 >1.9).  Received IV Lasix 80 mg x 1 yesterday.  I/Os not documented.  Denies any dyspnea or chest pain  Inpatient Medications    Scheduled Meds: . carvedilol  12.5 mg Oral BID WC  . enoxaparin (LOVENOX) injection  40 mg Subcutaneous Q24H  . escitalopram  10 mg Oral Daily  . famotidine  20 mg Oral BID   Continuous Infusions: . promethazine (PHENERGAN) injection (IM or IVPB) 12.5 mg (03/06/21 1306)   PRN Meds: acetaminophen **OR** acetaminophen, hydrALAZINE, ondansetron **OR** ondansetron (ZOFRAN) IV, polyethylene glycol, promethazine (PHENERGAN) injection (IM or IVPB)   Vital Signs    Vitals:   03/06/21 2051 03/07/21 0541 03/07/21 0754 03/07/21 1000  BP: (!) 162/82 (!) 173/81 (!) 168/93 (!) 155/79  Pulse: 63 66 69 62  Resp: 18 16    Temp: 98 F (36.7 C) 98.3 F (36.8 C)    TempSrc: Oral Oral    SpO2: 96% 96%    Weight:  81.6 kg      Intake/Output Summary (Last 24 hours) at 03/07/2021 1155 Last data filed at 03/07/2021 0900 Gross per 24 hour  Intake 170 ml  Output 200 ml  Net -30 ml   Last 3 Weights 03/07/2021  Weight (lbs) 179 lb 14.3 oz  Weight (kg) 81.6 kg      Telemetry    NSR in 60s- Personally Reviewed  ECG    No new ECG - Personally Reviewed  Physical Exam   GEN: No acute distress.   Neck: +JVD Cardiac: RRR, no murmurs, rubs, or gallops.  Respiratory: Bibasilar crackles GI: Soft, nontender, non-distended  MS: 1+ edema Neuro:  Nonfocal  Psych: Normal affect   Labs    High Sensitivity Troponin:   Recent Labs  Lab 03/04/21 0053 03/04/21 2144  TROPONINIHS 29* 31*      Chemistry Recent Labs  Lab 03/04/21 2144 03/05/21 0842 03/06/21 0445 03/07/21 0441  NA 137 138 141 141  K 3.9 4.0 3.9 3.6  CL 105 107 110 106  CO2 '25 24 24 28  '$ GLUCOSE 127* 130* 112* 99   BUN 26* 27* 30* 33*  CREATININE 1.29* 1.35* 1.59* 1.92*  CALCIUM 8.6* 8.3* 8.4* 8.2*  PROT 7.4 6.1* 5.8*  --   ALBUMIN 3.1* 2.6* 2.5*  --   AST '29 28 24  '$ --   ALT '17 14 14  '$ --   ALKPHOS 107 91 82  --   BILITOT 0.7 0.5 0.7  --   GFRNONAA 45* 43* 35* 28*  ANIONGAP '7 7 7 7     '$ Hematology Recent Labs  Lab 03/04/21 2144 03/05/21 0842 03/06/21 0445  WBC 5.5 5.3 6.1  RBC 3.84* 3.51* 3.31*  HGB 10.1* 9.5* 8.8*  HCT 32.9* 29.7* 28.3*  MCV 85.7 84.6 85.5  MCH 26.3 27.1 26.6  MCHC 30.7 32.0 31.1  RDW 14.7 14.9 14.9  PLT 265 234 234    BNP Recent Labs  Lab 03/04/21 2258  BNP 1,196.9*     DDimer  Recent Labs  Lab 03/04/21 2312  DDIMER 6.36*     Radiology    ECHOCARDIOGRAM COMPLETE  Result Date: 03/05/2021    ECHOCARDIOGRAM REPORT   Patient Name:   Kara Mullins Date of Exam: 03/05/2021 Medical Rec #:  [id number]      Height:  60.0 in Accession #:    [id number]     Weight:       160.0 lb Date of Birth:  06/08/1951     BSA:          1.698 m Patient Age:    12 years       BP:           182/85 mmHg Patient Gender: F              HR:           73 bpm. Exam Location:  Inpatient Procedure: 2D Echo, Cardiac Doppler and Color Doppler Indications:    I50.40* Unspecified combined systolic (congestive) and diastolic                 (congestive) heart failure  History:        Patient has no prior history of Echocardiogram examinations.                 Signs/Symptoms:Chest Pain. Elevated troponin. Elevated d dimer.  Sonographer:    Roseanna Rainbow RDCS Referring Phys: [id number] [place]  1. Left ventricular ejection fraction, by estimation, is 45%. The left ventricle has mildly decreased function. The left ventricle demonstrates global hypokinesis. There is severe left ventricular hypertrophy. Left ventricular diastolic parameters are consistent with Grade I diastolic dysfunction (impaired relaxation). Would consider cardiac amyloidosis.  2. Right ventricular systolic  function is normal. The right ventricular size is normal. There is mildly elevated pulmonary artery systolic pressure. The estimated right ventricular systolic pressure is 123456 mmHg.  3. Left atrial size was severely dilated.  4. The mitral valve is normal in structure. Mild to moderate mitral valve regurgitation. No evidence of mitral stenosis.  5. The aortic valve is tricuspid. Aortic valve regurgitation is trivial. No aortic stenosis is present.  6. Aortic dilatation noted. There is mild dilatation of the aortic root, measuring 37 mm.  7. The inferior vena cava is dilated in size with <50% respiratory variability, suggesting right atrial pressure of 15 mmHg.  8. There is a left pleural effusion and trivial pericardial effusion. FINDINGS  Left Ventricle: Left ventricular ejection fraction, by estimation, is 45%. The left ventricle has mildly decreased function. The left ventricle demonstrates global hypokinesis. The left ventricular internal cavity size was normal in size. There is severe left ventricular hypertrophy. Left ventricular diastolic parameters are consistent with Grade I diastolic dysfunction (impaired relaxation). Right Ventricle: The right ventricular size is normal. No increase in right ventricular wall thickness. Right ventricular systolic function is normal. There is mildly elevated pulmonary artery systolic pressure. The tricuspid regurgitant velocity is 2.57  m/s, and with an assumed right atrial pressure of 15 mmHg, the estimated right ventricular systolic pressure is 123456 mmHg. Left Atrium: Left atrial size was severely dilated. Right Atrium: Right atrial size was normal in size. Pericardium: There is a left pleural effusion. Trivial pericardial effusion is present. Mitral Valve: The mitral valve is normal in structure. Mild mitral annular calcification. Mild to moderate mitral valve regurgitation. No evidence of mitral valve stenosis. Tricuspid Valve: The tricuspid valve is normal in  structure. Tricuspid valve regurgitation is trivial. Aortic Valve: The aortic valve is tricuspid. Aortic valve regurgitation is trivial. No aortic stenosis is present. Pulmonic Valve: The pulmonic valve was normal in structure. Pulmonic valve regurgitation is trivial. Aorta: Aortic dilatation noted. There is mild dilatation of the aortic root, measuring 37 mm. Venous: The inferior vena cava is dilated in size  with less than 50% respiratory variability, suggesting right atrial pressure of 15 mmHg. IAS/Shunts: No atrial level shunt detected by color flow Doppler.  LEFT VENTRICLE PLAX 2D LVIDd:         5.02 cm      Diastology LVIDs:         3.73 cm      LV e' medial:    4.38 cm/s LV PW:         1.86 cm      LV E/e' medial:  20.4 LV IVS:        1.53 cm      LV e' lateral:   5.51 cm/s LVOT diam:     1.80 cm      LV E/e' lateral: 16.2 LV SV:         62 LV SV Index:   37 LVOT Area:     2.54 cm  LV Volumes (MOD) LV vol d, MOD A2C: 88.2 ml LV vol d, MOD A4C: 113.0 ml LV vol s, MOD A2C: 46.4 ml LV vol s, MOD A4C: 55.5 ml LV SV MOD A2C:     41.8 ml LV SV MOD A4C:     113.0 ml LV SV MOD BP:      50.2 ml RIGHT VENTRICLE             IVC RV S prime:     14.10 cm/s  IVC diam: 2.58 cm TAPSE (M-mode): 2.3 cm LEFT ATRIUM             Index       RIGHT ATRIUM           Index LA diam:        3.30 cm 1.94 cm/m  RA Area:     13.60 cm LA Vol (A2C):   85.4 ml 50.30 ml/m RA Volume:   28.80 ml  16.96 ml/m LA Vol (A4C):   99.1 ml 58.37 ml/m LA Biplane Vol: 97.9 ml 57.67 ml/m  AORTIC VALVE LVOT Vmax:   110.00 cm/s LVOT Vmean:  80.500 cm/s LVOT VTI:    0.245 m  AORTA Ao Root diam: 3.70 cm Ao Asc diam:  3.50 cm MITRAL VALVE                TRICUSPID VALVE MV Area (PHT): 3.60 cm     TR Peak grad:   26.4 mmHg MV Decel Time: 211 msec     TR Vmax:        257.00 cm/s MR PISA:        0.57 cm MR PISA Radius: 0.30 cm     SHUNTS MV E velocity: 89.40 cm/s   Systemic VTI:  0.24 m MV A velocity: 115.00 cm/s  Systemic Diam: 1.80 cm MV E/A ratio:  0.78  Loralie Champagne MD Electronically signed by Loralie Champagne MD Signature Date/Time: 03/05/2021/8:08:38 PM    Final     Cardiac Studies   Echo 03/05/21: 1. Left ventricular ejection fraction, by estimation, is 45%. The left  ventricle has mildly decreased function. The left ventricle demonstrates  global hypokinesis. There is severe left ventricular hypertrophy. Left  ventricular diastolic parameters are  consistent with Grade I diastolic dysfunction (impaired relaxation). Would  consider cardiac amyloidosis.  2. Right ventricular systolic function is normal. The right ventricular  size is normal. There is mildly elevated pulmonary artery systolic  pressure. The estimated right ventricular systolic pressure is 123456 mmHg.  3. Left atrial size was severely dilated.  4.  The mitral valve is normal in structure. Mild to moderate mitral valve  regurgitation. No evidence of mitral stenosis.  5. The aortic valve is tricuspid. Aortic valve regurgitation is trivial.  No aortic stenosis is present.  6. Aortic dilatation noted. There is mild dilatation of the aortic root,  measuring 37 mm.  7. The inferior vena cava is dilated in size with <50% respiratory  variability, suggesting right atrial pressure of 15 mmHg.  8. There is a left pleural effusion and trivial pericardial effusion.   Patient Profile     70 y.o. female with a hx of with a history of hypertension, lymphedema, anxiety   Assessment & Plan     Acute on chronic combined systolic and diastolic heart failure: Echocardiogram shows EF 45%, new diagnosis systolic dysfunction.  Presentation is concerning for cardiac amyloidosis with severe LVH on echo but relatively low voltage on EKG -Check SPEP, UPEP, light chains -Cardiac MRI planned, deferred for today given GFR <30, tentatively rescheduled for tomorrow pending improvement in renal function -Continue carvedilol 12.5 mg twice daily -Plan to add losartan once renal function  stable -Appears volume overloaded on exam but likely multifactorial with hypoalbuminemia contributing (albumin 2.5).  Worsening renal function with diuresis.  Would hold lasix. -Strict I/Os, daily weights  Hypertension: BP has been significantly elevated, up to 230/ 100s this admission, improved to 155/79 today -Continue carvedilol, plan to add losartan once renal function stable as above  For questions or updates, please contact [facility] Please consult [email or web address] for contact info under        Signed, Donato Heinz, MD  03/07/2021, 11:55 AM

## 2021-03-08 ENCOUNTER — Inpatient Hospital Stay (HOSPITAL_COMMUNITY): Payer: Medicare Other

## 2021-03-08 ENCOUNTER — Ambulatory Visit (HOSPITAL_COMMUNITY): Admit: 2021-03-08 | Payer: Medicare Other

## 2021-03-08 DIAGNOSIS — N179 Acute kidney failure, unspecified: Secondary | ICD-10-CM

## 2021-03-08 DIAGNOSIS — I5043 Acute on chronic combined systolic (congestive) and diastolic (congestive) heart failure: Secondary | ICD-10-CM | POA: Diagnosis not present

## 2021-03-08 DIAGNOSIS — I10 Essential (primary) hypertension: Secondary | ICD-10-CM | POA: Diagnosis not present

## 2021-03-08 DIAGNOSIS — I5041 Acute combined systolic (congestive) and diastolic (congestive) heart failure: Secondary | ICD-10-CM | POA: Diagnosis not present

## 2021-03-08 LAB — BASIC METABOLIC PANEL
Anion gap: 7 (ref 5–15)
BUN: 35 mg/dL — ABNORMAL HIGH (ref 8–23)
CO2: 28 mmol/L (ref 22–32)
Calcium: 8.1 mg/dL — ABNORMAL LOW (ref 8.9–10.3)
Chloride: 104 mmol/L (ref 98–111)
Creatinine, Ser: 1.98 mg/dL — ABNORMAL HIGH (ref 0.44–1.00)
GFR, Estimated: 27 mL/min — ABNORMAL LOW (ref >60)
Glucose, Bld: 91 mg/dL (ref 70–99)
Potassium: 3.5 mmol/L (ref 3.5–5.1)
Sodium: 139 mmol/L (ref 135–145)

## 2021-03-08 LAB — IRON AND TIBC
Iron: 14 ug/dL — ABNORMAL LOW (ref 28–170)
Saturation Ratios: 9 % — ABNORMAL LOW (ref 10.4–31.8)
TIBC: 162 ug/dL — ABNORMAL LOW (ref 250–450)
UIBC: 148 ug/dL

## 2021-03-08 LAB — KAPPA/LAMBDA LIGHT CHAINS
Kappa free light chain: 115.6 mg/L — ABNORMAL HIGH (ref 3.3–19.4)
Kappa, lambda light chain ratio: 3.46 — ABNORMAL HIGH (ref 0.26–1.65)
Lambda free light chains: 33.4 mg/L — ABNORMAL HIGH (ref 5.7–26.3)

## 2021-03-08 MED ORDER — FUROSEMIDE 10 MG/ML IJ SOLN
80.0000 mg | Freq: Two times a day (BID) | INTRAMUSCULAR | Status: DC
  Administered 2021-03-08 – 2021-03-10 (×4): 80 mg via INTRAVENOUS
  Filled 2021-03-08 (×4): qty 8

## 2021-03-08 MED ORDER — CARVEDILOL 25 MG PO TABS
25.0000 mg | ORAL_TABLET | Freq: Two times a day (BID) | ORAL | Status: DC
  Administered 2021-03-08 – 2021-03-14 (×12): 25 mg via ORAL
  Filled 2021-03-08 (×12): qty 1

## 2021-03-08 NOTE — Progress Notes (Signed)
Called and spoke with Archivist about process for calling [facility] to set up MRI when they have a time slot available. Per RN MRI might currently be on hold she would investigate further. RN aware of process. 03/08/21  CAP.

## 2021-03-08 NOTE — Progress Notes (Signed)
PROGRESS NOTE    Kara Mullins  [id number] DOB: [birth year] DOA: 03/04/2021 PCP: Forrest Moron, MD   Brief Narrative:  70 year old female with past medical history of hypertension, lymphedema, anxiety disorder who presented to the ED with multiple complaints including shortness of breath, cough, peripheral edema, nausea and vomiting. Patient also has been experiencing several month history of increasing bilateral lower extremity edema. Over the past 24 hours she developed nausea and frequent bouts of vomiting.  Patient was admitted with acute combined CHF exacerbation, intractable nausea and vomiting.  Cardiology consulted.  SBO ruled out.  Assessment & Plan:   Principal Problem:   Acute CHF (congestive heart failure) (HCC) Active Problems:   Hypertensive urgency   Nausea and vomiting   Elevated troponin level not due myocardial infarction   Elevated d-dimer   Generalized anxiety disorder   Acute congestive heart failure (HCC)   CHF exacerbation (HCC)   Essential hypertension   New/acute combined systolic and diastolic congestive heart failure: -echocardiogram showed global hypokinesis of left ventricle, EF [id number], grade 1 diastolic dysfunction.  Consider cardiac amyloidosis. she feels better today.  Not hypoxic.  Very faint crackles at right base.  Remains on Lasix 80 mg IV twice daily.  Cardiology on board.  They suspect cardiac amyloidosis and they have initiated the work-up.  Patient to go [facility] to get cardiac MRI but that is being canceled every day due to GFR being less than 30.  Cardiology holding Lasix due to rising creatinine.  Acute kidney injury: 1.29> 1.35> 1.59> 1.92> 1.90.  Per patient, her urine output is being recorded appropriately and if that is true, she has had only 600 cc output in the last 24 hours which is not enough.  Creatinine slightly worse today.  Renal ultrasound showed bilateral hydronephrosis yesterday.  Ordered CT renal stone today which ruled  out renal stone but shows left-sided moderate hydronephrosis.  No hydroureter.  No signs of obstruction.  It however showed left adnexal cyst.  Discussed with Dr. Gardiner Rhyme and he recommends consulting nephrology.  I have consulted nephrology.  Left adnexal complex cyst: We will order pelvic ultrasound.  Nausea vomiting: Symptoms resolved.  Abdominal x-ray negative for SBO.  Essential hypertension: Blood pressure fairly controlled.  Continue Coreg.  Elevated D-dimer: Ruled out of PE and DVT.  DVT prophylaxis: enoxaparin (LOVENOX) injection 40 mg Start: 03/05/21 1000   Code Status: Full Code  Family Communication: Daughter and her husband present at bedside.  Plan of care discussed with all of them and patient.  Status is: Inpatient  Remains inpatient appropriate because:Inpatient level of care appropriate due to severity of illness   Dispo: The patient is from: Home              Anticipated d/c is to: Home              Patient currently is not medically stable to d/c.   Difficult to place patient No        There is no height or weight on file to calculate BMI.      Nutritional status:               Consultants:   Cardiology  Procedures:     Antimicrobials:  Anti-infectives (From admission, onward)   None         Subjective: Patient seen and examined.  Husband and daughter at the bedside.  Patient has no complaints.  Objective: Vitals:   03/07/21 1335 03/07/21 2133 03/08/21  [id number] 03/08/21 1222  BP: (!) 171/89 (!) 145/75 (!) 170/81 139/75  Pulse:  87 65 (!) 57  Resp:    16  Temp:  98.3 F (36.8 C) 98.3 F (36.8 C) 98 F (36.7 C)  TempSrc:  Oral Oral Oral  SpO2:  93% 93% 95%  Weight:        Intake/Output Summary (Last 24 hours) at 03/08/2021 1400 Last data filed at 03/08/2021 0600 Gross per 24 hour  Intake 60 ml  Output 400 ml  Net -340 ml   Filed Weights   03/07/21 0541  Weight: 81.6 kg    Examination:  General exam: Appears  calm and comfortable  Respiratory system: Clear to auscultation. Respiratory effort normal. Cardiovascular system: S1 & S2 heard, RRR. No JVD, murmurs, rubs, gallops or clicks.  +2-3 pitting edema bilateral lower extremity Gastrointestinal system: Abdomen is nondistended, soft and nontender. No organomegaly or masses felt. Normal bowel sounds heard. Central nervous system: Alert and oriented. No focal neurological deficits. Extremities: Symmetric 5 x 5 power. Skin: No rashes, lesions or ulcers.  Psychiatry: Judgement and insight appear normal. Mood & affect appropriate.    Data Reviewed: I have personally reviewed following labs and imaging studies  CBC: Recent Labs  Lab 03/04/21 2144 03/05/21 0842 03/06/21 0445  WBC 5.5 5.3 6.1  NEUTROABS 4.7 4.2  --   HGB 10.1* 9.5* 8.8*  HCT 32.9* 29.7* 28.3*  MCV 85.7 84.6 85.5  PLT 265 234 [id number]   Basic Metabolic Panel: Recent Labs  Lab 03/04/21 2144 03/05/21 0842 03/06/21 0445 03/07/21 0441 03/08/21 0431  NA 137 138 141 141 139  K 3.9 4.0 3.9 3.6 3.5  CL 105 107 110 106 104  CO2 '25 24 24 28 28  '$ GLUCOSE 127* 130* 112* 99 91  BUN 26* 27* 30* 33* 35*  CREATININE 1.29* 1.35* 1.59* 1.92* 1.98*  CALCIUM 8.6* 8.3* 8.4* 8.2* 8.1*  MG  --  2.3  --   --   --    GFR: CrCl cannot be calculated (Unknown ideal weight.). Liver Function Tests: Recent Labs  Lab 03/04/21 2144 03/05/21 0842 03/06/21 0445  AST '29 28 24  '$ ALT '17 14 14  '$ ALKPHOS 107 91 82  BILITOT 0.7 0.5 0.7  PROT 7.4 6.1* 5.8*  ALBUMIN 3.1* 2.6* 2.5*   Recent Labs  Lab 03/04/21 2144  LIPASE 22   No results for input(s): AMMONIA in the last 168 hours. Coagulation Profile: No results for input(s): INR, PROTIME in the last 168 hours. Cardiac Enzymes: No results for input(s): CKTOTAL, CKMB, CKMBINDEX, TROPONINI in the last 168 hours. BNP (last 3 results) No results for input(s): PROBNP in the last 8760 hours. HbA1C: No results for input(s): HGBA1C in the last 72  hours. CBG: No results for input(s): GLUCAP in the last 168 hours. Lipid Profile: No results for input(s): CHOL, HDL, LDLCALC, TRIG, CHOLHDL, LDLDIRECT in the last 72 hours. Thyroid Function Tests: No results for input(s): TSH, T4TOTAL, FREET4, T3FREE, THYROIDAB in the last 72 hours. Anemia Panel: No results for input(s): VITAMINB12, FOLATE, FERRITIN, TIBC, IRON, RETICCTPCT in the last 72 hours. Sepsis Labs: No results for input(s): PROCALCITON, LATICACIDVEN in the last 168 hours.  Recent Results (from the past 240 hour(s))  Resp Panel by RT-PCR (Flu A&B, Covid) Nasopharyngeal Swab     Status: None   Collection Time: 03/05/21  4:07 AM   Specimen: Nasopharyngeal Swab; Nasopharyngeal(NP) swabs in vial transport medium  Result Value Ref Range Status   SARS  Coronavirus 2 by RT PCR NEGATIVE NEGATIVE Final    Comment: (NOTE) SARS-CoV-2 target nucleic acids are NOT DETECTED.  The SARS-CoV-2 RNA is generally detectable in upper respiratory specimens during the acute phase of infection. The lowest concentration of SARS-CoV-2 viral copies this assay can detect is 138 copies/mL. A negative result does not preclude SARS-Cov-2 infection and should not be used as the sole basis for treatment or other patient management decisions. A negative result may occur with  improper specimen collection/handling, submission of specimen other than nasopharyngeal swab, presence of viral mutation(s) within the areas targeted by this assay, and inadequate number of viral copies(<138 copies/mL). A negative result must be combined with clinical observations, patient history, and epidemiological information. The expected result is Negative.  Fact Sheet for Patients:  EntrepreneurPulse.com.au  Fact Sheet for Healthcare Providers:  IncredibleEmployment.be  This test is no t yet approved or cleared by the [place] FDA and  has been authorized for detection and/or  diagnosis of SARS-CoV-2 by FDA under an Emergency Use Authorization (EUA). This EUA will remain  in effect (meaning this test can be used) for the duration of the COVID-19 declaration under Section 564(b)(1) of the Act, 21 U.S.C.section 360bbb-3(b)(1), unless the authorization is terminated  or revoked sooner.       Influenza A by PCR NEGATIVE NEGATIVE Final   Influenza B by PCR NEGATIVE NEGATIVE Final    Comment: (NOTE) The Xpert Xpress SARS-CoV-2/FLU/RSV plus assay is intended as an aid in the diagnosis of influenza from Nasopharyngeal swab specimens and should not be used as a sole basis for treatment. Nasal washings and aspirates are unacceptable for Xpert Xpress SARS-CoV-2/FLU/RSV testing.  Fact Sheet for Patients: EntrepreneurPulse.com.au  Fact Sheet for Healthcare Providers: IncredibleEmployment.be  This test is not yet approved or cleared by the [place] FDA and has been authorized for detection and/or diagnosis of SARS-CoV-2 by FDA under an Emergency Use Authorization (EUA). This EUA will remain in effect (meaning this test can be used) for the duration of the COVID-19 declaration under Section 564(b)(1) of the Act, 21 U.S.C. section 360bbb-3(b)(1), unless the authorization is terminated or revoked.  Performed at [facility], [place] [address], [place], [place] [address]       Radiology Studies: US RENAL  Result Date: 03/07/2021 CLINICAL DATA:  18 year old with acute kidney injury. EXAM: RENAL / URINARY TRACT ULTRASOUND COMPLETE COMPARISON:  Chest CT 03/05/2021 FINDINGS: Right Kidney: Renal measurements: 12.7 x 4.0 x 6.5 cm = volume: 168 mL. Normal echogenicity. Mild hydronephrosis. No suspicious renal lesion. Left Kidney: Renal measurements: 13.6 x 6.4 x 6.4 = volume: 290 mL. Normal echogenicity. Moderate left hydronephrosis. No suspicious renal lesion. Anechoic cyst in the mid left kidney that measures up to  5.3 cm. Bladder: Fluid in the urinary bladder.   Ureter jets are not identified. Other: Bilateral pleural effusions. IMPRESSION: 1. Bilateral hydronephrosis. Moderate left hydronephrosis and mild right hydronephrosis. 2. Left renal cyst. 3. Bilateral pleural effusions. These results will be called to the ordering clinician or representative by the Radiologist Assistant, and communication documented in the PACS or [employer]. Electronically Signed   By: Markus Daft M.D.   On: 03/07/2021 16:37   CT RENAL STONE STUDY  Result Date: 03/08/2021 CLINICAL DATA:  Hydronephrosis. EXAM: CT ABDOMEN AND PELVIS WITHOUT CONTRAST TECHNIQUE: Multidetector CT imaging of the abdomen and pelvis was performed following the standard protocol without IV contrast. COMPARISON:  Mar 07, 2021. FINDINGS: Lower chest: Large bilateral pleural effusions are  noted with adjacent atelectasis of both lower lobes. Hepatobiliary: No focal liver abnormality is seen. No gallstones, gallbladder wall thickening, or biliary dilatation. Pancreas: Unremarkable. No pancreatic ductal dilatation or surrounding inflammatory changes. Spleen: Normal in size without focal abnormality. Adrenals/Urinary Tract: Adrenal glands appear normal. Small nonobstructive right renal calculus is noted. Moderate left hydronephrosis is noted without definite ureteral dilatation. Potentially this may be due to ureteropelvic junction obstruction. Urinary bladder contains contrast from previous CT scan, but is otherwise unremarkable. Stomach/Bowel: The stomach appears normal. There is no evidence of bowel obstruction or inflammation. Vascular/Lymphatic: Aortic atherosclerosis. No enlarged abdominal or pelvic lymph nodes. Reproductive: Uterus is unremarkable. Probable 6.3 cm left adnexal hyperdense or complex cyst is noted. Other: Moderate anasarca is noted. Moderate size fat containing periumbilical hernia is noted. Minimal ascites may be present in the pelvis.  Musculoskeletal: No acute or significant osseous findings. IMPRESSION: Large bilateral pleural effusions are noted with adjacent atelectasis of both lower lobes. Moderate left hydronephrosis is noted without definite ureteral dilatation. Potentially this may related to ureteropelvic junction obstruction or stenosis. No renal or ureteral calculi are noted. Small nonobstructive right renal calculus is noted. Probable 6.3 cm left adnexal hyperdense or complex cyst. Pelvic ultrasound is recommended for further evaluation. Moderate anasarca. Minimal ascites. Aortic Atherosclerosis (ICD10-I70.0). Electronically Signed   By: Marijo Conception M.D.   On: 03/08/2021 10:05    Scheduled Meds: . carvedilol  25 mg Oral BID WC  . enoxaparin (LOVENOX) injection  40 mg Subcutaneous Q24H  . escitalopram  10 mg Oral Daily  . famotidine  20 mg Oral BID   Continuous Infusions: . promethazine (PHENERGAN) injection (IM or IVPB) 12.5 mg (03/06/21 1306)     LOS: 2 days   Time spent: 30 minutes   Darliss Cheney, MD [facility]  03/08/2021, 2:00 PM   How to contact the [facility] Attending or Consulting provider [place] or covering provider during after hours [place], for this patient?  1. Check the care team in [facility] and look for a) attending/consulting [facility] provider listed and b) the [facility] team listed. Page or secure chat [phone]. 2. Log into [email or web address] and use [place]'s universal password to access. If you do not have the password, please contact the hospital operator. 3. Locate the [facility] provider you are looking for under [facility] and page to a number that you can be directly reached. 4. If you still have difficulty reaching the provider, please page the [facility] (Director on Call) for the Hospitalists listed on amion for assistance.

## 2021-03-08 NOTE — Consult Note (Signed)
I have been asked to see the patient by Dr. Darliss Cheney, for evaluation and management of left hydronephrosis.  History of present illness: 70 year old African-American female who was admitted through the emergency department with worsening shortness of breath, lower extremity edema, and abdominal pain.  She underwent a CT scan of the chest with contrast to rule out PE which was unremarkable.  She was also given IV Lasix early in the hospitalization.  At the time of her presentation her renal function was at her baseline, creatinine of 1.25-1.30.  Over the course of the next several days the patient's renal function started to worsen.  She subsequently had a renal ultrasound that showed hydronephrosis.  A CT scan was subsequently ordered, Noncon, to rule out kidney stones or any obvious source of obstruction.  She was noted to have dilated left renal pelvis.  The patient is not complaining of any flank pain.  She has no history of hematuria or recurrent urinary tract infections.  She denies any progressive urinary tract symptoms.  She denies any dysuria.  No history of kidney stones.  Review of systems: A 12 point comprehensive review of systems was obtained and is negative unless otherwise stated in the history of present illness.  Patient Active Problem List   Diagnosis Date Noted  . CHF exacerbation ([place]) 03/06/2021  . Essential hypertension   . Hypertensive urgency 03/05/2021  . Acute CHF (congestive heart failure) ([place]) 03/05/2021  . Nausea and vomiting 03/05/2021  . Elevated troponin level not due myocardial infarction 03/05/2021  . Elevated d-dimer 03/05/2021  . Generalized anxiety disorder 03/05/2021  . Acute congestive heart failure ([place]) 03/05/2021    No current facility-administered medications on file prior to encounter.   Current Outpatient Medications on File Prior to Encounter  Medication Sig Dispense Refill  . bisacodyl (FLEET) 10 MG/30ML ENEM Place 10 mg rectally once.     . escitalopram (LEXAPRO) 10 MG tablet Take 1 tablet by mouth daily.    . hydrochlorothiazide (HYDRODIURIL) 25 MG tablet Take 1 tablet by mouth daily.      Past Medical History:  Diagnosis Date  . Hypertension     History reviewed. No pertinent surgical history.  Social History   Tobacco Use  . Smoking status: Never Smoker  . Smokeless tobacco: Never Used  Vaping Use  . Vaping Use: Never used  Substance Use Topics  . Alcohol use: Not Currently  . Drug use: Never    Family History  Family history unknown: Yes    PE: Vitals:   03/07/21 2133 03/08/21 0639 03/08/21 1222 03/08/21 1620  BP: (!) 145/75 (!) 170/81 139/75 (!) 181/87  Pulse: 87 65 (!) 57 (!) 57  Resp:   16   Temp: 98.3 F (36.8 C) 98.3 F (36.8 C) 98 F (36.7 C)   TempSrc: Oral Oral Oral   SpO2: 93% 93% 95%   Weight:       Patient appears to be in no acute distress  patient is alert and oriented x3 Atraumatic normocephalic head No cervical or supraclavicular lymphadenopathy appreciated No increased work of breathing, no audible wheezes/rhonchi Regular sinus rhythm/rate Abdomen is soft, nontender, nondistended, no CVA or suprapubic tenderness Lower extremities are symmetric without appreciable edema Grossly neurologically intact No identifiable skin lesions  Recent Labs    03/06/21 0445  WBC 6.1  HGB 8.8*  HCT 28.3*   Recent Labs    03/06/21 0445 03/07/21 0441 03/08/21 0431  NA 141 141 139  K 3.9 3.6  3.5  CL 110 106 104  CO2 '24 28 28  '$ GLUCOSE 112* 99 91  BUN 30* 33* 35*  CREATININE 1.59* 1.92* 1.98*  CALCIUM 8.4* 8.2* 8.1*   No results for input(s): LABPT, INR in the last 72 hours. No results for input(s): LABURIN in the last 72 hours. Results for orders placed or performed during the hospital encounter of 03/04/21  Resp Panel by RT-PCR (Flu A&B, Covid) Nasopharyngeal Swab     Status: None   Collection Time: 03/05/21  4:07 AM   Specimen: Nasopharyngeal Swab; Nasopharyngeal(NP) swabs  in vial transport medium  Result Value Ref Range Status   SARS Coronavirus 2 by RT PCR NEGATIVE NEGATIVE Final    Comment: (NOTE) SARS-CoV-2 target nucleic acids are NOT DETECTED.  The SARS-CoV-2 RNA is generally detectable in upper respiratory specimens during the acute phase of infection. The lowest concentration of SARS-CoV-2 viral copies this assay can detect is 138 copies/mL. A negative result does not preclude SARS-Cov-2 infection and should not be used as the sole basis for treatment or other patient management decisions. A negative result may occur with  improper specimen collection/handling, submission of specimen other than nasopharyngeal swab, presence of viral mutation(s) within the areas targeted by this assay, and inadequate number of viral copies(<138 copies/mL). A negative result must be combined with clinical observations, patient history, and epidemiological information. The expected result is Negative.  Fact Sheet for Patients:  EntrepreneurPulse.com.au  Fact Sheet for Healthcare Providers:  IncredibleEmployment.be  This test is no t yet approved or cleared by the [place] FDA and  has been authorized for detection and/or diagnosis of SARS-CoV-2 by FDA under an Emergency Use Authorization (EUA). This EUA will remain  in effect (meaning this test can be used) for the duration of the COVID-19 declaration under Section 564(b)(1) of the Act, 21 U.S.C.section 360bbb-3(b)(1), unless the authorization is terminated  or revoked sooner.       Influenza A by PCR NEGATIVE NEGATIVE Final   Influenza B by PCR NEGATIVE NEGATIVE Final    Comment: (NOTE) The Xpert Xpress SARS-CoV-2/FLU/RSV plus assay is intended as an aid in the diagnosis of influenza from Nasopharyngeal swab specimens and should not be used as a sole basis for treatment. Nasal washings and aspirates are unacceptable for Xpert Xpress  SARS-CoV-2/FLU/RSV testing.  Fact Sheet for Patients: EntrepreneurPulse.com.au  Fact Sheet for Healthcare Providers: IncredibleEmployment.be  This test is not yet approved or cleared by the [place] FDA and has been authorized for detection and/or diagnosis of SARS-CoV-2 by FDA under an Emergency Use Authorization (EUA). This EUA will remain in effect (meaning this test can be used) for the duration of the COVID-19 declaration under Section 564(b)(1) of the Act, 21 U.S.C. section 360bbb-3(b)(1), unless the authorization is terminated or revoked.  Performed at [facility], [place] [address], [place], [place] [address]     Imaging: I reviewed the patient's renal ultrasound as well as the CT scan of the abdomen and pelvis.  The CT scan demonstrates what appears to be a UPJ obstruction on the left with no hydroureter.  Imp: I suspect the patient's renal function has declined over the course of her hospitalization as a result of contrast nephropathy in addition to excessive diuresis.  Her CT scan demonstrates what appears to be a chronic UPJ obstruction of the left kidney without significant loss of parenchyma or evidence that this is clinically significant in any way.  Recommendations: Recommend no additional treatment for her dilated  left kidney/UPJ obstruction.  I spoke to the family and the patient about this in detail.  We will plan for her to follow-up with me with a renal ultrasound in 4 to 6 weeks.   Thank you for involving me in this patient's care, Please page with any further questions or concerns. Ardis Hughs

## 2021-03-08 NOTE — Consult Note (Addendum)
[facility] Renal Consultation Note  Requesting MD:  Indication for Consultation:  Acute kidney injury, maintenance of euvolemia, assessment treatment of a I abnormalities, as treatment and assessment of acid base abnormalities.  HPI: Kara Mullins is a 70 y.o. female. With a past medical of hypertension lymphedema anxiety disorder, she has a history of hypertensive urgency and congestive heart failure.  Her last 2D echo 03/06/2021 revealed an ejection fraction about 40%.  With diastolic dysfunction.  She has baseline sink and about 1.3 mg/dL.  And since admission she has gradually had worsening of her creatinine. She presented to the emergency room 03/04/2021.  She had the complaints of nausea vomiting with increasing lower extremity swelling. Underwent CT scan with 100 cc of contrast administered 03/05/2021.  This was for rule out pulmonary embolus.  Her inpatient medications include Coreg 25 mg twice a day, Lexapro 10 mg daily famotidine 20 mg twice a day.  She appears been given 1 dose of IV Lasix 03/04/2021 and another dose of 80 mg 03/05/2021.  She appears to have reasonable urine output with 400 cc recorded 03/07/2021 and 875 cc 03/08/2021.  She appears to be hemodynamically stable although she presented with fairly significant hypertension blood pressure 194/102.   CT scan revealed large bilateral pleural effusions.  Moderate left hydronephrosis without definite ureteral dilatation. In addition a left adnexal complex cyst noted  Labs 5/26/2022sodium 139 potassium 3.5 chloride104 CO2 28 BUN 35 creatinine 1.98 glucose 91 calcium 8.5.  Hemoglobin 8.8. urine shows greater than 300 protein.  6-10 RBCs per high-powered film with no WBCs noted.  Creatinine, Ser  Date/Time Value Ref Range Status  03/08/2021 04:31 AM 1.98 (H) 0.44 - 1.00 mg/dL Final  03/07/2021 04:41 AM 1.92 (H) 0.44 - 1.00 mg/dL Final  03/06/2021 04:45 AM 1.59 (H) 0.44 - 1.00 mg/dL Final  03/05/2021 08:42 AM 1.35  (H) 0.44 - 1.00 mg/dL Final  03/04/2021 09:44 PM 1.29 (H) 0.44 - 1.00 mg/dL Final  11/19/2019 09:30 PM 0.61 0.44 - 1.00 mg/dL Final     PMHx:   Past Medical History:  Diagnosis Date  . Hypertension     History reviewed. No pertinent surgical history.  Family Hx:  Family History  Family history unknown: Yes    Social History:  reports that she has never smoked. She has never used smokeless tobacco. She reports previous alcohol use. She reports that she does not use drugs.  Allergies: No Known Allergies  Medications: Prior to Admission medications   Medication Sig Start Date End Date Taking? Authorizing Provider  bisacodyl (FLEET) 10 MG/30ML ENEM Place 10 mg rectally once.   Yes [provider]  escitalopram (LEXAPRO) 10 MG tablet Take 1 tablet by mouth daily. 11/06/20  Yes [provider]  hydrochlorothiazide (HYDRODIURIL) 25 MG tablet Take 1 tablet by mouth daily. 01/08/21  Yes [provider]     Labs:  Results for orders placed or performed during the hospital encounter of 03/04/21 (from the past 48 hour(s))  Basic metabolic panel     Status: Abnormal   Collection Time: 03/07/21  4:41 AM  Result Value Ref Range   Sodium 141 135 - 145 mmol/L   Potassium 3.6 3.5 - 5.1 mmol/L   Chloride 106 98 - 111 mmol/L   CO2 28 22 - 32 mmol/L   Glucose, Bld 99 70 - 99 mg/dL    Comment: Glucose reference range applies only to samples taken after fasting for at least 8 hours.   BUN 33 (  H) 8 - 23 mg/dL   Creatinine, Ser 1.92 (H) 0.44 - 1.00 mg/dL   Calcium 8.2 (L) 8.9 - 10.3 mg/dL   GFR, Estimated 28 (L) >60 mL/min    Comment: (NOTE) Calculated using the CKD-EPI Creatinine Equation (2021)    Anion gap 7 5 - 15    Comment: Performed at [facility], [place] [address], [place], [place] [id number]  Basic metabolic panel     Status: Abnormal   Collection Time: 03/08/21  4:31 AM  Result Value Ref Range   Sodium 139 135 - 145 mmol/L    Potassium 3.5 3.5 - 5.1 mmol/L   Chloride 104 98 - 111 mmol/L   CO2 28 22 - 32 mmol/L   Glucose, Bld 91 70 - 99 mg/dL    Comment: Glucose reference range applies only to samples taken after fasting for at least 8 hours.   BUN 35 (H) 8 - 23 mg/dL   Creatinine, Ser 1.98 (H) 0.44 - 1.00 mg/dL   Calcium 8.1 (L) 8.9 - 10.3 mg/dL   GFR, Estimated 27 (L) >60 mL/min    Comment: (NOTE) Calculated using the CKD-EPI Creatinine Equation (2021)    Anion gap 7 5 - 15    Comment: Performed at [facility], [place] [address], [place], [place] [address]     ROS: General: No fever sweats or chills Eyes: No visual complaints.  Vision double vision ENT: No hearing loss or epistaxis or throat Cardiovascular: Increased lower extremity swelling orthopnea no palpitations no chest pain Respiratory: Cough and dyspnea. Abdominal system: No abdominal pain pertinent for nausea and vomiting Urogenital no urgency frequency dysuria Neurologic no strokes or seizures numbness tingling or weakness in upper or lower extremities Hematologic no skin rash or itching   Physical Exam: Vitals:   03/08/21 0639 03/08/21 1222  BP: (!) 170/81 139/75  Pulse: 65 (!) 57  Resp:  16  Temp: 98.3 F (36.8 C) 98 F (36.7 C)  SpO2: 93% 95%     General: elderly lady in no obvious distress HEENT: normocephalic atraumatic mucous membranes moist Eyes: pupils round equal reactive Neck: JVP elevated neck otherwise supple Heart: regular rate and rhythm no murmurs rubs gallops Lungs:bilateral basilar crackles diminished air entry at bases Abdomen: soft nontender nondistended Extremities: edema noted in lower extremities Skin: no skin rashes noted Neuro: nonfocal  Assessment/Plan: 1. Acute kidney injury.  Hydronephrosis noted on CT scan.  Would recommend consultation with urology.  It is also noted that there is both proteinuria and some hematuria.   We will send SPEP and UPEP. K/L light chain ratio. There is a  consideration for cardiac amyloidosis.  We will also check hepatitis, complement, ANA and ANCA. Her baseline creatinine is not normal but runs about 1.3 mg/dL which would give a stage III chronic kidney disease. The cause of her acute on chronic renal disease could potentially be the use of IV contrast that was administered 03/05/2021 leading to some acute tubular necrosis. Would continue to avoid nephrotoxins , including IV contrast,ACE inhibitors and ARB's.  Renally adjust medications.    2. Hypertension/volume  - clinically appears to be volume overloaded.  Would recommend diuresis with Lasix.  Will continue the use of 80 mg IV every 12 hours.  Will see if she has a good diuretic response with this dose. 3. Anemia  We will check iron studies.  Evaluate for ESA 4. Congestive heart failure with systolic dysfunction.  As per cardiology.  Ejection fraction  40/45% consideration for cardiac amyloid. 5. Left adnexal complex cyst.  Pelvic ultrasound pending.   Sherril Croon 03/08/2021, 2:49 PM

## 2021-03-08 NOTE — Progress Notes (Signed)
Progress Note  Patient Name: Kara Mullins Date of Encounter: 03/08/2021  [facility] Cardiologist: None   Subjective   Worsening kidney function (creatinine 1.3 > 1.4 > 1.6 >1.9 >2.0).  Renal ultrasound shows bilateral hydronephrosis.  She denies any chest pain.  Reports dyspnea has improved.  Inpatient Medications    Scheduled Meds: . carvedilol  25 mg Oral BID WC  . enoxaparin (LOVENOX) injection  40 mg Subcutaneous Q24H  . escitalopram  10 mg Oral Daily  . famotidine  20 mg Oral BID   Continuous Infusions: . promethazine (PHENERGAN) injection (IM or IVPB) 12.5 mg (03/06/21 1306)   PRN Meds: acetaminophen **OR** acetaminophen, hydrALAZINE, ondansetron **OR** ondansetron (ZOFRAN) IV, polyethylene glycol, promethazine (PHENERGAN) injection (IM or IVPB)   Vital Signs    Vitals:   03/07/21 1235 03/07/21 1335 03/07/21 2133 03/08/21 0639  BP: (!) 178/86 (!) 171/89 (!) 145/75 (!) 170/81  Pulse: 61  87 65  Resp: 20     Temp: 98.2 F (36.8 C)  98.3 F (36.8 C) 98.3 F (36.8 C)  TempSrc: Oral  Oral Oral  SpO2: 96%  93% 93%  Weight:        Intake/Output Summary (Last 24 hours) at 03/08/2021 1147 Last data filed at 03/08/2021 0600 Gross per 24 hour  Intake 60 ml  Output 400 ml  Net -340 ml   Last 3 Weights 03/07/2021  Weight (lbs) 179 lb 14.3 oz  Weight (kg) 81.6 kg      Telemetry    NSR in 60s- Personally Reviewed  ECG    No new ECG - Personally Reviewed  Physical Exam   GEN: No acute distress.   Neck: +JVD, just above the clavicles sitting upright Cardiac: RRR, no murmurs, rubs, or gallops.  Respiratory:  Diminished breath sounds at bases GI: Soft, nontender, non-distended  MS: 1+ edema Neuro:  Nonfocal  Psych: Normal affect   Labs    High Sensitivity Troponin:   Recent Labs  Lab 03/04/21 0053 03/04/21 2144  TROPONINIHS 29* 31*      Chemistry Recent Labs  Lab 03/04/21 2144 03/05/21 0842 03/06/21 0445 03/07/21 0441 03/08/21 0431   NA 137 138 141 141 139  K 3.9 4.0 3.9 3.6 3.5  CL 105 107 110 106 104  CO2 '25 24 24 28 28  '$ GLUCOSE 127* 130* 112* 99 91  BUN 26* 27* 30* 33* 35*  CREATININE 1.29* 1.35* 1.59* 1.92* 1.98*  CALCIUM 8.6* 8.3* 8.4* 8.2* 8.1*  PROT 7.4 6.1* 5.8*  --   --   ALBUMIN 3.1* 2.6* 2.5*  --   --   AST '29 28 24  '$ --   --   ALT '17 14 14  '$ --   --   ALKPHOS 107 91 82  --   --   BILITOT 0.7 0.5 0.7  --   --   GFRNONAA 45* 43* 35* 28* 27*  ANIONGAP '7 7 7 7 7     '$ Hematology Recent Labs  Lab 03/04/21 2144 03/05/21 0842 03/06/21 0445  WBC 5.5 5.3 6.1  RBC 3.84* 3.51* 3.31*  HGB 10.1* 9.5* 8.8*  HCT 32.9* 29.7* 28.3*  MCV 85.7 84.6 85.5  MCH 26.3 27.1 26.6  MCHC 30.7 32.0 31.1  RDW 14.7 14.9 14.9  PLT 265 234 234    BNP Recent Labs  Lab 03/04/21 2258  BNP 1,196.9*     DDimer  Recent Labs  Lab 03/04/21 2312  DDIMER 6.36*     Radiology  US RENAL  Result Date: 03/07/2021 CLINICAL DATA:  67 year old with acute kidney injury. EXAM: RENAL / URINARY TRACT ULTRASOUND COMPLETE COMPARISON:  Chest CT 03/05/2021 FINDINGS: Right Kidney: Renal measurements: 12.7 x 4.0 x 6.5 cm = volume: 168 mL. Normal echogenicity. Mild hydronephrosis. No suspicious renal lesion. Left Kidney: Renal measurements: 13.6 x 6.4 x 6.4 = volume: 290 mL. Normal echogenicity. Moderate left hydronephrosis. No suspicious renal lesion. Anechoic cyst in the mid left kidney that measures up to 5.3 cm. Bladder: Fluid in the urinary bladder.   Ureter jets are not identified. Other: Bilateral pleural effusions. IMPRESSION: 1. Bilateral hydronephrosis. Moderate left hydronephrosis and mild right hydronephrosis. 2. Left renal cyst. 3. Bilateral pleural effusions. These results will be called to the ordering clinician or representative by the Radiologist Assistant, and communication documented in the PACS or [employer]. Electronically Signed   By: Markus Daft M.D.   On: 03/07/2021 16:37   CT RENAL STONE STUDY  Result Date:  03/08/2021 CLINICAL DATA:  Hydronephrosis. EXAM: CT ABDOMEN AND PELVIS WITHOUT CONTRAST TECHNIQUE: Multidetector CT imaging of the abdomen and pelvis was performed following the standard protocol without IV contrast. COMPARISON:  Mar 07, 2021. FINDINGS: Lower chest: Large bilateral pleural effusions are noted with adjacent atelectasis of both lower lobes. Hepatobiliary: No focal liver abnormality is seen. No gallstones, gallbladder wall thickening, or biliary dilatation. Pancreas: Unremarkable. No pancreatic ductal dilatation or surrounding inflammatory changes. Spleen: Normal in size without focal abnormality. Adrenals/Urinary Tract: Adrenal glands appear normal. Small nonobstructive right renal calculus is noted. Moderate left hydronephrosis is noted without definite ureteral dilatation. Potentially this may be due to ureteropelvic junction obstruction. Urinary bladder contains contrast from previous CT scan, but is otherwise unremarkable. Stomach/Bowel: The stomach appears normal. There is no evidence of bowel obstruction or inflammation. Vascular/Lymphatic: Aortic atherosclerosis. No enlarged abdominal or pelvic lymph nodes. Reproductive: Uterus is unremarkable. Probable 6.3 cm left adnexal hyperdense or complex cyst is noted. Other: Moderate anasarca is noted. Moderate size fat containing periumbilical hernia is noted. Minimal ascites may be present in the pelvis. Musculoskeletal: No acute or significant osseous findings. IMPRESSION: Large bilateral pleural effusions are noted with adjacent atelectasis of both lower lobes. Moderate left hydronephrosis is noted without definite ureteral dilatation. Potentially this may related to ureteropelvic junction obstruction or stenosis. No renal or ureteral calculi are noted. Small nonobstructive right renal calculus is noted. Probable 6.3 cm left adnexal hyperdense or complex cyst. Pelvic ultrasound is recommended for further evaluation. Moderate anasarca. Minimal  ascites. Aortic Atherosclerosis (ICD10-I70.0). Electronically Signed   By: Marijo Conception M.D.   On: 03/08/2021 10:05    Cardiac Studies   Echo 03/05/21: 1. Left ventricular ejection fraction, by estimation, is 45%. The left  ventricle has mildly decreased function. The left ventricle demonstrates  global hypokinesis. There is severe left ventricular hypertrophy. Left  ventricular diastolic parameters are  consistent with Grade I diastolic dysfunction (impaired relaxation). Would  consider cardiac amyloidosis.  2. Right ventricular systolic function is normal. The right ventricular  size is normal. There is mildly elevated pulmonary artery systolic  pressure. The estimated right ventricular systolic pressure is 123456 mmHg.  3. Left atrial size was severely dilated.  4. The mitral valve is normal in structure. Mild to moderate mitral valve  regurgitation. No evidence of mitral stenosis.  5. The aortic valve is tricuspid. Aortic valve regurgitation is trivial.  No aortic stenosis is present.  6. Aortic dilatation noted. There is mild dilatation of the aortic root,  measuring 37 mm.  7. The inferior vena cava is dilated in size with <50% respiratory  variability, suggesting right atrial pressure of 15 mmHg.  8. There is a left pleural effusion and trivial pericardial effusion.   Patient Profile     70 y.o. female with a hx of with a history of hypertension, lymphedema, anxiety   Assessment & Plan     Acute on chronic combined systolic and diastolic heart failure: Echocardiogram shows EF 45%, new diagnosis systolic dysfunction.  Presentation is concerning for cardiac amyloidosis with severe LVH on echo but relatively low voltage on EKG -SPEP, UPEP, light chains pending -Cardiac MRI planned, deferred for today given GFR <30, can plan to obtain once renal function improved -Continue carvedilol, will increase to 25 mg BID -Plan to add losartan once renal function stable -Appears  volume overloaded on exam but likely multifactorial with hypoalbuminemia contributing (albumin 2.5).  Worsening renal function with diuresis.  Holding lasix for now.  Renal ultrasound shows hydronephrosis, CT stone protocol ordered and nephrology consulted. -Strict I/Os, daily weights  Hypertension: BP has been significantly elevated, up to 230/ 100s this admission, improved to 155/79 today -Continue carvedilol, plan to add losartan once renal function stable as above  For questions or updates, please contact [facility] Please consult [email or web address] for contact info under        Signed, Donato Heinz, MD  03/08/2021, 11:47 AM

## 2021-03-09 ENCOUNTER — Encounter (HOSPITAL_COMMUNITY): Payer: Self-pay | Admitting: Family Medicine

## 2021-03-09 DIAGNOSIS — I5031 Acute diastolic (congestive) heart failure: Secondary | ICD-10-CM

## 2021-03-09 DIAGNOSIS — I5043 Acute on chronic combined systolic (congestive) and diastolic (congestive) heart failure: Secondary | ICD-10-CM | POA: Diagnosis not present

## 2021-03-09 DIAGNOSIS — N179 Acute kidney failure, unspecified: Secondary | ICD-10-CM

## 2021-03-09 DIAGNOSIS — I5041 Acute combined systolic (congestive) and diastolic (congestive) heart failure: Secondary | ICD-10-CM | POA: Diagnosis not present

## 2021-03-09 LAB — PROTEIN ELECTROPHORESIS, SERUM
A/G Ratio: 0.8 (ref 0.7–1.7)
Albumin ELP: 2.4 g/dL — ABNORMAL LOW (ref 2.9–4.4)
Alpha-1-Globulin: 0.3 g/dL (ref 0.0–0.4)
Alpha-2-Globulin: 0.8 g/dL (ref 0.4–1.0)
Beta Globulin: 0.8 g/dL (ref 0.7–1.3)
Gamma Globulin: 1.2 g/dL (ref 0.4–1.8)
Globulin, Total: 3 g/dL (ref 2.2–3.9)
M-Spike, %: 0.2 g/dL — ABNORMAL HIGH
Total Protein ELP: 5.4 g/dL — ABNORMAL LOW (ref 6.0–8.5)

## 2021-03-09 LAB — BASIC METABOLIC PANEL
Anion gap: 8 (ref 5–15)
BUN: 35 mg/dL — ABNORMAL HIGH (ref 8–23)
CO2: 26 mmol/L (ref 22–32)
Calcium: 8.1 mg/dL — ABNORMAL LOW (ref 8.9–10.3)
Chloride: 102 mmol/L (ref 98–111)
Creatinine, Ser: 1.82 mg/dL — ABNORMAL HIGH (ref 0.44–1.00)
GFR, Estimated: 30 mL/min — ABNORMAL LOW (ref >60)
Glucose, Bld: 74 mg/dL (ref 70–99)
Potassium: 3 mmol/L — ABNORMAL LOW (ref 3.5–5.1)
Sodium: 136 mmol/L (ref 135–145)

## 2021-03-09 LAB — HEPATITIS B SURFACE ANTIGEN: Hepatitis B Surface Ag: NONREACTIVE

## 2021-03-09 LAB — KAPPA/LAMBDA LIGHT CHAINS
Kappa free light chain: 129.3 mg/L — ABNORMAL HIGH (ref 3.3–19.4)
Kappa, lambda light chain ratio: 3.49 — ABNORMAL HIGH (ref 0.26–1.65)
Lambda free light chains: 37.1 mg/L — ABNORMAL HIGH (ref 5.7–26.3)

## 2021-03-09 LAB — IMMUNOFIXATION, URINE

## 2021-03-09 LAB — C4 COMPLEMENT: Complement C4, Body Fluid: 39 mg/dL — ABNORMAL HIGH (ref 12–38)

## 2021-03-09 LAB — C3 COMPLEMENT: C3 Complement: 122 mg/dL (ref 82–167)

## 2021-03-09 MED ORDER — AMLODIPINE BESYLATE 5 MG PO TABS
5.0000 mg | ORAL_TABLET | Freq: Every day | ORAL | Status: DC
  Administered 2021-03-09 – 2021-03-14 (×6): 5 mg via ORAL
  Filled 2021-03-09 (×5): qty 1

## 2021-03-09 MED ORDER — POTASSIUM CHLORIDE 20 MEQ PO PACK
40.0000 meq | PACK | ORAL | Status: AC
  Administered 2021-03-09: 40 meq via ORAL
  Filled 2021-03-09: qty 2

## 2021-03-09 MED ORDER — POTASSIUM CHLORIDE CRYS ER 20 MEQ PO TBCR
40.0000 meq | EXTENDED_RELEASE_TABLET | ORAL | Status: DC
  Administered 2021-03-09: 40 meq via ORAL
  Filled 2021-03-09: qty 2

## 2021-03-09 NOTE — Progress Notes (Signed)
Patient experienced episode of nausea vomiting after administration PO potassium tablets. Provider updated. Pt does not feel antiemetic is needed at this time. Will continue to monitor.

## 2021-03-09 NOTE — Progress Notes (Signed)
[facility] Progress Note   70 y.o. female HTN lymphedema anxiety disorder, HFrEF  echo 03/06/2021 EF 40%. with diastolic dysfunction.  She has baseline Cr ~1.3 mg/dL.  And since admission she has gradually had worsening of her creatinine. She presented to the emergency room 03/04/2021.  She had the complaints of nausea vomiting with increasing lower extremity swelling. Underwent CT scan with 100 cc of contrast administered 03/05/2021.  This was for rule out pulmonary embolus. Moerate left hydronephrosis without definite ureteral dilatation. In addition a left adnexal complex cyst noted. Urine shows greater than 300 protein.  6-10 RBCs per high-powered film with no WBCs noted.  Assessment/ Plan:   1. Acute kidney injury.  Hydronephrosis noted on CT scan.  Would recommend consultation with urology.  It is also noted that there is both proteinuria and some hematuria.   We will send SPEP and UPEP. K/L light chain ratio. There is a consideration for cardiac amyloidosis.  We will also check hepatitis, complement, ANA and ANCA. Her baseline creatinine is not normal but runs about 1.3 mg/dL which would be stage III chronic kidney disease. The cause of her acute on chronic renal disease could potentially be the use of IV contrast that was administered 03/05/2021 + diureses leading to some acute tubular necrosis.   - Would continue to avoid nephrotoxins , including IV contrast,ACE inhibitors and ARB's.  Renally adjust medications.   Renal function slowly improving with Lasix '80mg'$  BID which she rx only 1 dose on 5/26.  Would continue diuresis as she's volume overloaded and monitor response. Very brisk UOP 3575 yesterday and already 1250 out today.  May consider UNNA boots to help mobilize the fluid in the lower extremities if renal function worsens.  Appreciate urology input; no acute therapy necessary.   2. Hypertension/volume  - clinically appears to be volume overloaded.  Would recommend  diuresis with Lasix.  Will continue the use of 80 mg IV every 12 hours.  Good diuretic response with this dose. 3. Anemia  We will check iron studies.  Evaluate for ESA 4. Congestive heart failure with systolic dysfunction.  As per cardiology.  Ejection fraction 40/45% consideration for cardiac amyloid. 5. Left adnexal complex cyst.  Pelvic ultrasound pending.  Subjective:   Breathing fairly comfortably unless walking. Denies f/c/n/v/ chills.   Objective:   BP (!) 172/80 (BP Location: Left Arm)   Pulse 60   Temp 98.2 F (36.8 C) (Oral)   Resp 18   Wt 80.4 kg   SpO2 97%   Intake/Output Summary (Last 24 hours) at 03/09/2021 1149 Last data filed at 03/09/2021 [id number] Gross per 24 hour  Intake 120 ml  Output 4825 ml  Net -4705 ml   Weight change:   Physical Exam: General: elderly lady in no obvious distress, breathing comfortably Neck: JVP elevated neck otherwise supple Heart: regular rate and rhythm  Lungs:clear, no  crackles noted Abdomen: soft nontender nondistended Extremities: 2+ edema noted in lower extremities Skin: no skin rashes noted Neuro: nonfocal  Imaging: US PELVIS (TRANSABDOMINAL ONLY)  Result Date: 03/08/2021 CLINICAL DATA:  Adnexal cyst. EXAM: TRANSABDOMINAL ULTRASOUND OF PELVIS TECHNIQUE: Transabdominal ultrasound examination of the pelvis was performed including evaluation of the uterus, ovaries, adnexal regions, and pelvic cul-de-sac. COMPARISON:  CT 03/08/2021. FINDINGS: Uterus Measurements: 8.7 x 4.8 x 5.9 cm = volume: 128.3 mL. No fibroids or other mass visualized. Endometrium Thickness: 5.6.  No focal abnormality visualized. Right ovary Measurements: Not visualized Left ovary Measurements: 6.3 x 5.9 x  5.5 cm = volume: 106.7 mL. 4.5 x 6.0 x 6.6 cm simple cyst left ovary Other findings:  Small amount of free pelvic fluid. IMPRESSION: 6.6 cm simple cyst left ovary. This is almost certainly benign, but follow up ultrasound is recommended in 1 year according to the  Society of Radiologists in [place] Statement (D Clovis Riley et al. Management of Asymptomatic Ovarian and Other Adnexal Cysts Imaged at [place]: Society of Radiologists in [place] Statement 2010. Radiology 256 (Sept 2010): [id number].). Right ovary not visualized. Small amount of free pelvic fluid. Electronically Signed   By: Marcello Moores  Register   On: 03/08/2021 15:57   US RENAL  Result Date: 03/07/2021 CLINICAL DATA:  44 year old with acute kidney injury. EXAM: RENAL / URINARY TRACT ULTRASOUND COMPLETE COMPARISON:  Chest CT 03/05/2021 FINDINGS: Right Kidney: Renal measurements: 12.7 x 4.0 x 6.5 cm = volume: 168 mL. Normal echogenicity. Mild hydronephrosis. No suspicious renal lesion. Left Kidney: Renal measurements: 13.6 x 6.4 x 6.4 = volume: 290 mL. Normal echogenicity. Moderate left hydronephrosis. No suspicious renal lesion. Anechoic cyst in the mid left kidney that measures up to 5.3 cm. Bladder: Fluid in the urinary bladder.   Ureter jets are not identified. Other: Bilateral pleural effusions. IMPRESSION: 1. Bilateral hydronephrosis. Moderate left hydronephrosis and mild right hydronephrosis. 2. Left renal cyst. 3. Bilateral pleural effusions. These results will be called to the ordering clinician or representative by the Radiologist Assistant, and communication documented in the PACS or [employer]. Electronically Signed   By: Markus Daft M.D.   On: 03/07/2021 16:37   CT RENAL STONE STUDY  Result Date: 03/08/2021 CLINICAL DATA:  Hydronephrosis. EXAM: CT ABDOMEN AND PELVIS WITHOUT CONTRAST TECHNIQUE: Multidetector CT imaging of the abdomen and pelvis was performed following the standard protocol without IV contrast. COMPARISON:  Mar 07, 2021. FINDINGS: Lower chest: Large bilateral pleural effusions are noted with adjacent atelectasis of both lower lobes. Hepatobiliary: No focal liver abnormality is seen. No gallstones, gallbladder wall thickening, or biliary  dilatation. Pancreas: Unremarkable. No pancreatic ductal dilatation or surrounding inflammatory changes. Spleen: Normal in size without focal abnormality. Adrenals/Urinary Tract: Adrenal glands appear normal. Small nonobstructive right renal calculus is noted. Moderate left hydronephrosis is noted without definite ureteral dilatation. Potentially this may be due to ureteropelvic junction obstruction. Urinary bladder contains contrast from previous CT scan, but is otherwise unremarkable. Stomach/Bowel: The stomach appears normal. There is no evidence of bowel obstruction or inflammation. Vascular/Lymphatic: Aortic atherosclerosis. No enlarged abdominal or pelvic lymph nodes. Reproductive: Uterus is unremarkable. Probable 6.3 cm left adnexal hyperdense or complex cyst is noted. Other: Moderate anasarca is noted. Moderate size fat containing periumbilical hernia is noted. Minimal ascites may be present in the pelvis. Musculoskeletal: No acute or significant osseous findings. IMPRESSION: Large bilateral pleural effusions are noted with adjacent atelectasis of both lower lobes. Moderate left hydronephrosis is noted without definite ureteral dilatation. Potentially this may related to ureteropelvic junction obstruction or stenosis. No renal or ureteral calculi are noted. Small nonobstructive right renal calculus is noted. Probable 6.3 cm left adnexal hyperdense or complex cyst. Pelvic ultrasound is recommended for further evaluation. Moderate anasarca. Minimal ascites. Aortic Atherosclerosis (ICD10-I70.0). Electronically Signed   By: Marijo Conception M.D.   On: 03/08/2021 10:05    Labs: BMET Recent Labs  Lab 03/04/21 2144 03/05/21 0842 03/06/21 0445 03/07/21 0441 03/08/21 0431 03/09/21 0501  NA 137 138 141 141 139 136  K 3.9 4.0 3.9 3.6 3.5 3.0*  CL 105 107 110  106 104 102  CO2 '25 24 24 28 28 26  '$ GLUCOSE 127* 130* 112* 99 91 74  BUN 26* 27* 30* 33* 35* 35*  CREATININE 1.29* 1.35* 1.59* 1.92* 1.98* 1.82*   CALCIUM 8.6* 8.3* 8.4* 8.2* 8.1* 8.1*   CBC Recent Labs  Lab 03/04/21 2144 03/05/21 0842 03/06/21 0445  WBC 5.5 5.3 6.1  NEUTROABS 4.7 4.2  --   HGB 10.1* 9.5* 8.8*  HCT 32.9* 29.7* 28.3*  MCV 85.7 84.6 85.5  PLT 265 234 234    Medications:    . carvedilol  25 mg Oral BID WC  . enoxaparin (LOVENOX) injection  40 mg Subcutaneous Q24H  . escitalopram  10 mg Oral Daily  . famotidine  20 mg Oral BID  . furosemide  80 mg Intravenous Q12H  . potassium chloride  40 mEq Oral Q4H      Otelia Santee, MD 03/09/2021, 11:49 AM

## 2021-03-09 NOTE — Progress Notes (Addendum)
PROGRESS NOTE    Kara Mullins  [id number] DOB: [birth year] DOA: 03/04/2021 PCP: Forrest Moron, MD   Brief Narrative:  70 year old female with past medical history of hypertension, lymphedema, anxiety disorder who presented to the ED with multiple complaints including shortness of breath, cough, peripheral edema, nausea and vomiting. Patient also has been experiencing several month history of increasing bilateral lower extremity edema. Over the past 24 hours she developed nausea and frequent bouts of vomiting.  Patient was admitted with acute combined CHF exacerbation, intractable nausea and vomiting.  Cardiology consulted.  SBO ruled out.  Assessment & Plan:   Principal Problem:   Acute CHF (congestive heart failure) (HCC) Active Problems:   Hypertensive urgency   Nausea and vomiting   Elevated troponin level not due myocardial infarction   Elevated d-dimer   Generalized anxiety disorder   Acute congestive heart failure (HCC)   CHF exacerbation (HCC)   Essential hypertension   New/acute combined systolic and diastolic congestive heart failure: -echocardiogram showed global hypokinesis of left ventricle, EF [id number], grade 1 diastolic dysfunction.  Consider cardiac amyloidosis. Cardiology on board.  They suspect cardiac amyloidosis and they have initiated the work-up.  Patient to go to [facility] to get cardiac MRI but that is being canceled every day due to GFR being less than 30.  She was resumed back on IV Lasix by nephrology yesterday.  Has had good urine output last 24 hours.  Acute kidney injury: 1.29> 1.35> 1.59> 1.92> 1.98> 1.8.  Some improvement in creatinine today.  Good urine output in last 24 hours.  Renal ultrasound showed bilateral hydronephrosis.  But CT renal stone ruled out renal stone and showed only left-sided moderate hydronephrosis.  No hydroureter.  Possibility of left UPJ obstruction.  Per cardiology recommendation, nephrology was consulted and there is no  inpatient dialysis.  Nephrology then recommended consulting neurology.  She was seen by urology who opined that patient's obstruction is chronic and does not need any intervention and recommended 78-monthoutpatient follow-up.  Appreciate all of them.  Management per nephrology from here.  Hypokalemia: 3.0.  Will replace.  Recheck in the morning.  Left adnexal complex cyst: Ultrasound confirmed benign cyst.  Nausea vomiting: Symptoms resolved.  Abdominal x-ray negative for SBO.  Essential hypertension: Blood pressure fairly controlled.  Continue Coreg.  Elevated D-dimer: Ruled out of PE and DVT.  DVT prophylaxis: enoxaparin (LOVENOX) injection 40 mg Start: 03/05/21 1000   Code Status: Full Code  Family Communication: Daughter  present at bedside.  Plan of care discussed with patient and daughter.  Status is: Inpatient  Remains inpatient appropriate because:Inpatient level of care appropriate due to severity of illness   Dispo: The patient is from: Home              Anticipated d/c is to: Home              Patient currently is not medically stable to d/c.   Difficult to place patient No        There is no height or weight on file to calculate BMI.      Nutritional status:               Consultants:   Cardiology  Procedures:     Antimicrobials:  Anti-infectives (From admission, onward)   None         Subjective: Seen and examined.  Daughter at the bedside.  She feels better today.  She tells me that overnight her oxygen saturation  dropped and she required oxygen but she did not feel any shortness of breath.  Objective: Vitals:   03/08/21 2008 03/09/21 0251 03/09/21 0445 03/09/21 0500  BP: (!) 162/72 (!) 159/77 (!) 172/80   Pulse: (!) 55 (!) 59 60   Resp: '18 18 18   '$ Temp: 97.9 F (36.6 C)  98.2 F (36.8 C)   TempSrc: Oral  Oral   SpO2: (!) 89% 94% 97%   Weight:    80.4 kg    Intake/Output Summary (Last 24 hours) at 03/09/2021 1154 Last  data filed at 03/09/2021 0954 Gross per 24 hour  Intake 120 ml  Output 4825 ml  Net -4705 ml   Filed Weights   03/07/21 0541 03/09/21 0500  Weight: 81.6 kg 80.4 kg    Examination:  General exam: Appears calm and comfortable  Respiratory system: Clear to auscultation. Respiratory effort normal. Cardiovascular system: S1 & S2 heard, RRR. No JVD, murmurs, rubs, gallops or clicks.  +2 pitting edema bilateral lower extremity Gastrointestinal system: Abdomen is nondistended, soft and nontender. No organomegaly or masses felt. Normal bowel sounds heard. Central nervous system: Alert and oriented. No focal neurological deficits. Extremities: Symmetric 5 x 5 power. Skin: No rashes, lesions or ulcers.  Psychiatry: Judgement and insight appear normal. Mood & affect appropriate.    Data Reviewed: I have personally reviewed following labs and imaging studies  CBC: Recent Labs  Lab 03/04/21 2144 03/05/21 0842 03/06/21 0445  WBC 5.5 5.3 6.1  NEUTROABS 4.7 4.2  --   HGB 10.1* 9.5* 8.8*  HCT 32.9* 29.7* 28.3*  MCV 85.7 84.6 85.5  PLT 265 234 [id number]   Basic Metabolic Panel: Recent Labs  Lab 03/05/21 0842 03/06/21 0445 03/07/21 0441 03/08/21 0431 03/09/21 0501  NA 138 141 141 139 136  K 4.0 3.9 3.6 3.5 3.0*  CL 107 110 106 104 102  CO2 '24 24 28 28 26  '$ GLUCOSE 130* 112* 99 91 74  BUN 27* 30* 33* 35* 35*  CREATININE 1.35* 1.59* 1.92* 1.98* 1.82*  CALCIUM 8.3* 8.4* 8.2* 8.1* 8.1*  MG 2.3  --   --   --   --    GFR: CrCl cannot be calculated (Unknown ideal weight.). Liver Function Tests: Recent Labs  Lab 03/04/21 2144 03/05/21 0842 03/06/21 0445  AST '29 28 24  '$ ALT '17 14 14  '$ ALKPHOS 107 91 82  BILITOT 0.7 0.5 0.7  PROT 7.4 6.1* 5.8*  ALBUMIN 3.1* 2.6* 2.5*   Recent Labs  Lab 03/04/21 2144  LIPASE 22   No results for input(s): AMMONIA in the last 168 hours. Coagulation Profile: No results for input(s): INR, PROTIME in the last 168 hours. Cardiac Enzymes: No results  for input(s): CKTOTAL, CKMB, CKMBINDEX, TROPONINI in the last 168 hours. BNP (last 3 results) No results for input(s): PROBNP in the last 8760 hours. HbA1C: No results for input(s): HGBA1C in the last 72 hours. CBG: No results for input(s): GLUCAP in the last 168 hours. Lipid Profile: No results for input(s): CHOL, HDL, LDLCALC, TRIG, CHOLHDL, LDLDIRECT in the last 72 hours. Thyroid Function Tests: No results for input(s): TSH, T4TOTAL, FREET4, T3FREE, THYROIDAB in the last 72 hours. Anemia Panel: Recent Labs    03/08/21 1535  TIBC 162*  IRON 14*   Sepsis Labs: No results for input(s): PROCALCITON, LATICACIDVEN in the last 168 hours.  Recent Results (from the past 240 hour(s))  Resp Panel by RT-PCR (Flu A&B, Covid) Nasopharyngeal Swab     Status: None  Collection Time: 03/05/21  4:07 AM   Specimen: Nasopharyngeal Swab; Nasopharyngeal(NP) swabs in vial transport medium  Result Value Ref Range Status   SARS Coronavirus 2 by RT PCR NEGATIVE NEGATIVE Final    Comment: (NOTE) SARS-CoV-2 target nucleic acids are NOT DETECTED.  The SARS-CoV-2 RNA is generally detectable in upper respiratory specimens during the acute phase of infection. The lowest concentration of SARS-CoV-2 viral copies this assay can detect is 138 copies/mL. A negative result does not preclude SARS-Cov-2 infection and should not be used as the sole basis for treatment or other patient management decisions. A negative result may occur with  improper specimen collection/handling, submission of specimen other than nasopharyngeal swab, presence of viral mutation(s) within the areas targeted by this assay, and inadequate number of viral copies(<138 copies/mL). A negative result must be combined with clinical observations, patient history, and epidemiological information. The expected result is Negative.  Fact Sheet for Patients:  EntrepreneurPulse.com.au  Fact Sheet for Healthcare Providers:   IncredibleEmployment.be  This test is no t yet approved or cleared by the [place] FDA and  has been authorized for detection and/or diagnosis of SARS-CoV-2 by FDA under an Emergency Use Authorization (EUA). This EUA will remain  in effect (meaning this test can be used) for the duration of the COVID-19 declaration under Section 564(b)(1) of the Act, 21 U.S.C.section 360bbb-3(b)(1), unless the authorization is terminated  or revoked sooner.       Influenza A by PCR NEGATIVE NEGATIVE Final   Influenza B by PCR NEGATIVE NEGATIVE Final    Comment: (NOTE) The Xpert Xpress SARS-CoV-2/FLU/RSV plus assay is intended as an aid in the diagnosis of influenza from Nasopharyngeal swab specimens and should not be used as a sole basis for treatment. Nasal washings and aspirates are unacceptable for Xpert Xpress SARS-CoV-2/FLU/RSV testing.  Fact Sheet for Patients: EntrepreneurPulse.com.au  Fact Sheet for Healthcare Providers: IncredibleEmployment.be  This test is not yet approved or cleared by the [place] FDA and has been authorized for detection and/or diagnosis of SARS-CoV-2 by FDA under an Emergency Use Authorization (EUA). This EUA will remain in effect (meaning this test can be used) for the duration of the COVID-19 declaration under Section 564(b)(1) of the Act, 21 U.S.C. section 360bbb-3(b)(1), unless the authorization is terminated or revoked.  Performed at [facility], [place] [address], [place], [place] [address]       Radiology Studies: US PELVIS (TRANSABDOMINAL ONLY)  Result Date: 03/08/2021 CLINICAL DATA:  Adnexal cyst. EXAM: TRANSABDOMINAL ULTRASOUND OF PELVIS TECHNIQUE: Transabdominal ultrasound examination of the pelvis was performed including evaluation of the uterus, ovaries, adnexal regions, and pelvic cul-de-sac. COMPARISON:  CT 03/08/2021. FINDINGS: Uterus Measurements: 8.7 x 4.8 x  5.9 cm = volume: 128.3 mL. No fibroids or other mass visualized. Endometrium Thickness: 5.6.  No focal abnormality visualized. Right ovary Measurements: Not visualized Left ovary Measurements: 6.3 x 5.9 x 5.5 cm = volume: 106.7 mL. 4.5 x 6.0 x 6.6 cm simple cyst left ovary Other findings:  Small amount of free pelvic fluid. IMPRESSION: 6.6 cm simple cyst left ovary. This is almost certainly benign, but follow up ultrasound is recommended in 1 year according to the Society of Radiologists in [place] Statement (D Clovis Riley et al. Management of Asymptomatic Ovarian and Other Adnexal Cysts Imaged at [place]: Society of Radiologists in [place] Statement 2010. Radiology 256 (Sept 2010): [id number].). Right ovary not visualized. Small amount of free pelvic fluid. Electronically Signed   By: Marcello Moores  Register   On: 03/08/2021 15:57   US RENAL  Result Date: 03/07/2021 CLINICAL DATA:  24 year old with acute kidney injury. EXAM: RENAL / URINARY TRACT ULTRASOUND COMPLETE COMPARISON:  Chest CT 03/05/2021 FINDINGS: Right Kidney: Renal measurements: 12.7 x 4.0 x 6.5 cm = volume: 168 mL. Normal echogenicity. Mild hydronephrosis. No suspicious renal lesion. Left Kidney: Renal measurements: 13.6 x 6.4 x 6.4 = volume: 290 mL. Normal echogenicity. Moderate left hydronephrosis. No suspicious renal lesion. Anechoic cyst in the mid left kidney that measures up to 5.3 cm. Bladder: Fluid in the urinary bladder.   Ureter jets are not identified. Other: Bilateral pleural effusions. IMPRESSION: 1. Bilateral hydronephrosis. Moderate left hydronephrosis and mild right hydronephrosis. 2. Left renal cyst. 3. Bilateral pleural effusions. These results will be called to the ordering clinician or representative by the Radiologist Assistant, and communication documented in the PACS or [employer]. Electronically Signed   By: Markus Daft M.D.   On: 03/07/2021 16:37   CT RENAL STONE STUDY  Result Date:  03/08/2021 CLINICAL DATA:  Hydronephrosis. EXAM: CT ABDOMEN AND PELVIS WITHOUT CONTRAST TECHNIQUE: Multidetector CT imaging of the abdomen and pelvis was performed following the standard protocol without IV contrast. COMPARISON:  Mar 07, 2021. FINDINGS: Lower chest: Large bilateral pleural effusions are noted with adjacent atelectasis of both lower lobes. Hepatobiliary: No focal liver abnormality is seen. No gallstones, gallbladder wall thickening, or biliary dilatation. Pancreas: Unremarkable. No pancreatic ductal dilatation or surrounding inflammatory changes. Spleen: Normal in size without focal abnormality. Adrenals/Urinary Tract: Adrenal glands appear normal. Small nonobstructive right renal calculus is noted. Moderate left hydronephrosis is noted without definite ureteral dilatation. Potentially this may be due to ureteropelvic junction obstruction. Urinary bladder contains contrast from previous CT scan, but is otherwise unremarkable. Stomach/Bowel: The stomach appears normal. There is no evidence of bowel obstruction or inflammation. Vascular/Lymphatic: Aortic atherosclerosis. No enlarged abdominal or pelvic lymph nodes. Reproductive: Uterus is unremarkable. Probable 6.3 cm left adnexal hyperdense or complex cyst is noted. Other: Moderate anasarca is noted. Moderate size fat containing periumbilical hernia is noted. Minimal ascites may be present in the pelvis. Musculoskeletal: No acute or significant osseous findings. IMPRESSION: Large bilateral pleural effusions are noted with adjacent atelectasis of both lower lobes. Moderate left hydronephrosis is noted without definite ureteral dilatation. Potentially this may related to ureteropelvic junction obstruction or stenosis. No renal or ureteral calculi are noted. Small nonobstructive right renal calculus is noted. Probable 6.3 cm left adnexal hyperdense or complex cyst. Pelvic ultrasound is recommended for further evaluation. Moderate anasarca. Minimal  ascites. Aortic Atherosclerosis (ICD10-I70.0). Electronically Signed   By: Marijo Conception M.D.   On: 03/08/2021 10:05    Scheduled Meds: . carvedilol  25 mg Oral BID WC  . enoxaparin (LOVENOX) injection  40 mg Subcutaneous Q24H  . escitalopram  10 mg Oral Daily  . famotidine  20 mg Oral BID  . furosemide  80 mg Intravenous Q12H  . potassium chloride  40 mEq Oral Q4H   Continuous Infusions: . promethazine (PHENERGAN) injection (IM or IVPB) 12.5 mg (03/06/21 1306)     LOS: 3 days   Time spent: 28 minutes   Darliss Cheney, MD [facility]  03/09/2021, 11:54 AM   How to contact the [facility] Attending or Consulting provider [place] or covering provider during after hours [place], for this patient?  1. Check the care team in [facility] and look for a) attending/consulting [facility] provider listed and b) the [facility] team listed. Page or secure  chat [phone]. 2. Log into [email or web address] and use [place]'s universal password to access. If you do not have the password, please contact the hospital operator. 3. Locate the [facility] provider you are looking for under [facility] and page to a number that you can be directly reached. 4. If you still have difficulty reaching the provider, please page the [facility] (Director on Call) for the Hospitalists listed on amion for assistance.

## 2021-03-09 NOTE — Plan of Care (Signed)
  Problem: Elimination: Goal: Will not experience complications related to urinary retention Outcome: Progressing   Problem: Safety: Goal: Ability to remain free from injury will improve Outcome: Progressing   Problem: Health Behavior/Discharge Planning: Goal: Ability to manage health-related needs will improve Outcome: Progressing

## 2021-03-09 NOTE — Progress Notes (Addendum)
Progress Note  Patient Name: Kara Mullins Date of Encounter: 03/09/2021  Primary Cardiologist: New   Subjective   Nephrology and urology consulted 03/08/21. Ok with IV Lasix '80mg'$  BID. No chest pain or SOB. Feels well.  Inpatient Medications    Scheduled Meds: . carvedilol  25 mg Oral BID WC  . enoxaparin (LOVENOX) injection  40 mg Subcutaneous Q24H  . escitalopram  10 mg Oral Daily  . famotidine  20 mg Oral BID  . furosemide  80 mg Intravenous Q12H  . potassium chloride  40 mEq Oral Q4H   Continuous Infusions: . promethazine (PHENERGAN) injection (IM or IVPB) 12.5 mg (03/06/21 1306)   PRN Meds: acetaminophen **OR** acetaminophen, hydrALAZINE, ondansetron **OR** ondansetron (ZOFRAN) IV, polyethylene glycol, promethazine (PHENERGAN) injection (IM or IVPB)   Vital Signs    Vitals:   03/08/21 2008 03/09/21 0251 03/09/21 0445 03/09/21 0500  BP: (!) 162/72 (!) 159/77 (!) 172/80   Pulse: (!) 55 (!) 59 60   Resp: '18 18 18   '$ Temp: 97.9 F (36.6 C)  98.2 F (36.8 C)   TempSrc: Oral  Oral   SpO2: (!) 89% 94% 97%   Weight:    80.4 kg    Intake/Output Summary (Last 24 hours) at 03/09/2021 1114 Last data filed at 03/09/2021 0954 Gross per 24 hour  Intake 120 ml  Output 4825 ml  Net -4705 ml   Filed Weights   03/07/21 0541 03/09/21 0500  Weight: 81.6 kg 80.4 kg    Physical Exam   General: Elderly, NAD Neck: Negative for carotid bruits. + JVD Lungs: Diminished in bilateral upper and lower lobes. Breathing is unlabored. Cardiovascular: RRR with S1 S2. No murmurs Abdomen: Soft, non-tender, non-distended. No obvious abdominal masses. Extremities: 1+ edema. Radial pulses 2+ bilaterally Neuro: Alert and oriented. No focal deficits. No facial asymmetry. MAE spontaneously. Psych: Responds to questions appropriately with normal affect.   Labs    Chemistry Recent Labs  Lab 03/04/21 2144 03/05/21 0842 03/06/21 0445 03/07/21 0441 03/08/21 0431 03/09/21 0501  NA  137 138 141 141 139 136  K 3.9 4.0 3.9 3.6 3.5 3.0*  CL 105 107 110 106 104 102  CO2 '25 24 24 28 28 26  '$ GLUCOSE 127* 130* 112* 99 91 74  BUN 26* 27* 30* 33* 35* 35*  CREATININE 1.29* 1.35* 1.59* 1.92* 1.98* 1.82*  CALCIUM 8.6* 8.3* 8.4* 8.2* 8.1* 8.1*  PROT 7.4 6.1* 5.8*  --   --   --   ALBUMIN 3.1* 2.6* 2.5*  --   --   --   AST '29 28 24  '$ --   --   --   ALT '17 14 14  '$ --   --   --   ALKPHOS 107 91 82  --   --   --   BILITOT 0.7 0.5 0.7  --   --   --   GFRNONAA 45* 43* 35* 28* 27* 30*  ANIONGAP '7 7 7 7 7 8     '$ Hematology Recent Labs  Lab 03/04/21 2144 03/05/21 0842 03/06/21 0445  WBC 5.5 5.3 6.1  RBC 3.84* 3.51* 3.31*  HGB 10.1* 9.5* 8.8*  HCT 32.9* 29.7* 28.3*  MCV 85.7 84.6 85.5  MCH 26.3 27.1 26.6  MCHC 30.7 32.0 31.1  RDW 14.7 14.9 14.9  PLT 265 234 234    Cardiac EnzymesNo results for input(s): TROPONINI in the last 168 hours. No results for input(s): TROPIPOC in the last 168 hours.  BNP Recent Labs  Lab 03/04/21 2258  BNP 1,196.9*     DDimer  Recent Labs  Lab 03/04/21 2312  DDIMER 6.36*     Radiology    US PELVIS (TRANSABDOMINAL ONLY)  Result Date: 03/08/2021 CLINICAL DATA:  Adnexal cyst. EXAM: TRANSABDOMINAL ULTRASOUND OF PELVIS TECHNIQUE: Transabdominal ultrasound examination of the pelvis was performed including evaluation of the uterus, ovaries, adnexal regions, and pelvic cul-de-sac. COMPARISON:  CT 03/08/2021. FINDINGS: Uterus Measurements: 8.7 x 4.8 x 5.9 cm = volume: 128.3 mL. No fibroids or other mass visualized. Endometrium Thickness: 5.6.  No focal abnormality visualized. Right ovary Measurements: Not visualized Left ovary Measurements: 6.3 x 5.9 x 5.5 cm = volume: 106.7 mL. 4.5 x 6.0 x 6.6 cm simple cyst left ovary Other findings:  Small amount of free pelvic fluid. IMPRESSION: 6.6 cm simple cyst left ovary. This is almost certainly benign, but follow up ultrasound is recommended in 1 year according to the Society of Radiologists in  [place] Statement (D Clovis Riley et al. Management of Asymptomatic Ovarian and Other Adnexal Cysts Imaged at [place]: Society of Radiologists in [place] Statement 2010. Radiology 256 (Sept 2010): [id number].). Right ovary not visualized. Small amount of free pelvic fluid. Electronically Signed   By: Marcello Moores  Register   On: 03/08/2021 15:57   US RENAL  Result Date: 03/07/2021 CLINICAL DATA:  79 year old with acute kidney injury. EXAM: RENAL / URINARY TRACT ULTRASOUND COMPLETE COMPARISON:  Chest CT 03/05/2021 FINDINGS: Right Kidney: Renal measurements: 12.7 x 4.0 x 6.5 cm = volume: 168 mL. Normal echogenicity. Mild hydronephrosis. No suspicious renal lesion. Left Kidney: Renal measurements: 13.6 x 6.4 x 6.4 = volume: 290 mL. Normal echogenicity. Moderate left hydronephrosis. No suspicious renal lesion. Anechoic cyst in the mid left kidney that measures up to 5.3 cm. Bladder: Fluid in the urinary bladder.   Ureter jets are not identified. Other: Bilateral pleural effusions. IMPRESSION: 1. Bilateral hydronephrosis. Moderate left hydronephrosis and mild right hydronephrosis. 2. Left renal cyst. 3. Bilateral pleural effusions. These results will be called to the ordering clinician or representative by the Radiologist Assistant, and communication documented in the PACS or [employer]. Electronically Signed   By: Markus Daft M.D.   On: 03/07/2021 16:37   CT RENAL STONE STUDY  Result Date: 03/08/2021 CLINICAL DATA:  Hydronephrosis. EXAM: CT ABDOMEN AND PELVIS WITHOUT CONTRAST TECHNIQUE: Multidetector CT imaging of the abdomen and pelvis was performed following the standard protocol without IV contrast. COMPARISON:  Mar 07, 2021. FINDINGS: Lower chest: Large bilateral pleural effusions are noted with adjacent atelectasis of both lower lobes. Hepatobiliary: No focal liver abnormality is seen. No gallstones, gallbladder wall thickening, or biliary dilatation. Pancreas:  Unremarkable. No pancreatic ductal dilatation or surrounding inflammatory changes. Spleen: Normal in size without focal abnormality. Adrenals/Urinary Tract: Adrenal glands appear normal. Small nonobstructive right renal calculus is noted. Moderate left hydronephrosis is noted without definite ureteral dilatation. Potentially this may be due to ureteropelvic junction obstruction. Urinary bladder contains contrast from previous CT scan, but is otherwise unremarkable. Stomach/Bowel: The stomach appears normal. There is no evidence of bowel obstruction or inflammation. Vascular/Lymphatic: Aortic atherosclerosis. No enlarged abdominal or pelvic lymph nodes. Reproductive: Uterus is unremarkable. Probable 6.3 cm left adnexal hyperdense or complex cyst is noted. Other: Moderate anasarca is noted. Moderate size fat containing periumbilical hernia is noted. Minimal ascites may be present in the pelvis. Musculoskeletal: No acute or significant osseous findings. IMPRESSION: Large bilateral pleural effusions are noted with adjacent atelectasis of both lower  lobes. Moderate left hydronephrosis is noted without definite ureteral dilatation. Potentially this may related to ureteropelvic junction obstruction or stenosis. No renal or ureteral calculi are noted. Small nonobstructive right renal calculus is noted. Probable 6.3 cm left adnexal hyperdense or complex cyst. Pelvic ultrasound is recommended for further evaluation. Moderate anasarca. Minimal ascites. Aortic Atherosclerosis (ICD10-I70.0). Electronically Signed   By: Marijo Conception M.D.   On: 03/08/2021 10:05   Telemetry    03/09/21 SB with rates in the upper 50's- Personally Reviewed  ECG    No new tracing as of 03/09/21- Personally Reviewed  Cardiac Studies   Echo 03/05/21: 1. Left ventricular ejection fraction, by estimation, is 45%. The left  ventricle has mildly decreased function. The left ventricle demonstrates  global hypokinesis. There is severe left  ventricular hypertrophy. Left  ventricular diastolic parameters are  consistent with Grade I diastolic dysfunction (impaired relaxation). Would  consider cardiac amyloidosis.  2. Right ventricular systolic function is normal. The right ventricular  size is normal. There is mildly elevated pulmonary artery systolic  pressure. The estimated right ventricular systolic pressure is 123456 mmHg.  3. Left atrial size was severely dilated.  4. The mitral valve is normal in structure. Mild to moderate mitral valve  regurgitation. No evidence of mitral stenosis.  5. The aortic valve is tricuspid. Aortic valve regurgitation is trivial.  No aortic stenosis is present.  6. Aortic dilatation noted. There is mild dilatation of the aortic root,  measuring 37 mm.  7. The inferior vena cava is dilated in size with <50% respiratory  variability, suggesting right atrial pressure of 15 mmHg.  8. There is a left pleural effusion and trivial pericardial effusion.   Patient Profile     70 y.o. female with a history of hypertension, lymphedema, and anxiety  Assessment & Plan    1. Acute on chronic combined CHF: -Echocardiogram this admission with LVEF at 45% with presentation concerning for cardiac amyloidosis with severe LVH but low voltage on EKG -SPEP, UPEP, light chains pending  -Plan was for cardiac MRI yesterday however this was deferred given GFR <30. Plan to obtain once renal function improves. Could be done as outpatient -Continue carvedilol '25mg'$  BID -Nephrology following given AKI, suspect contrast-induced nephropathy -Continue IV Lasix 80 mg twice daily -Amyloid work-up: high kappa/lambda light chain work-up, urine immunofixation shows IgG monoclonal protein with kappa light chain specificity.  Presentation concerning for AL amyloid, recommend hematology consult  2. AKI: -Hydronephrosis on CT>>seen by nephrology who recommended urology consultation. AKI felt to be in the setting of contrast  nephropathy. Renal ok with IV Lasix '80mg'$  BID -Urology with no immediate recs for dilated left kidney/UPJ obstruction however with plans for OP renal US and follow up in 4-6 weeks.   Signed, Kathyrn Drown NP-C [facility] Pager: [phone] 03/09/2021, 11:14 AM     For questions or updates, please contact   Please consult [email or web address] for contact info under Cardiology/STEMI.  Patient seen and examined.  Agree with above documentation.  On exam, patient is alert and oriented, regular rate and rhythm, no murmurs, lungs CTAB, 1+ LE edema, no JVD.  Net -3.5 L yesterday, excellent response to IV Lasix 80 mg.  Creatinine improved from 2.0 to 1.8 and volume status improved, will continue IV Lasix 80 mg twice daily.  For her amyloidosis work-up, she has an elevated kappa to lambda light chain ratio and urine immunofixation shows IgG monoclonal protein with kappa light chain specificity.  Her presentation is concerning for AL  amyloid, recommend hematology consult.  Donato Heinz, MD

## 2021-03-09 NOTE — Care Management Important Message (Signed)
Important Message  Patient Details IM Letter given to the Patient. Name: Kara Mullins MRN: [id number] Date of Birth: 04-19-1951   Medicare Important Message Given:  Yes     [name] 03/09/2021, 3:04 PM

## 2021-03-09 NOTE — Consult Note (Addendum)
[place]  [phone] [phone]    [place]  Referring MD:  Dr. Darliss Cheney  Reason for Referral: Concern for amyloidosis on echo, elevated serum light chains, IgG monoclonal protein with light chain specificity in the urine.   HPI: Kara Mullins is a 70 year old female with a past medical history significant for hypertension, lymphedema, and anxiety.  She presented to the emergency room with multiple complaints including shortness of breath, cough, peripheral edema, and nausea and vomiting.  The patient has been experiencing a several month history of increasing bilateral lower extremity edema.  Over the past few weeks, she also has developed paroxysmal nocturnal dyspnea and orthopnea.  For 1 week prior to admission, she also developed a cough that was intermittently productive.  For 24 hours prior to admission, she developed nausea and vomiting.  She was thought to be volume overloaded upon presentation and was started on IV diuretics.  On admission, her hemoglobin was 10.1, BUN 26, creatinine 1.29, calcium 8.6, albumin 3.1.  CTA chest on admission showed no PE, pulmonary edema with trace to small volume left and small to moderate volume right pleural effusions.  She had a renal ultrasound performed which showed bilateral hydronephrosis-moderate left hydronephrosis and mild right hydronephrosis.  She had an echocardiogram performed this admission with an LVEF of 45% with presentation concerning for cardiac amyloidosis.  Urine immunofixation performed 03/06/2021 showed IgG monoclonal protein with kappa light chain specificity.  Serum Kappa free light chain was elevated at 115.6, lambda free light chain was elevated at 33.4, and kappa, lambda light chain ratio was also elevated at 3.46.  SPEP has been obtained and is currently pending.  The patient was seen today in her hospital room. Her ex-husband and daughter are at the bedside. Overall,  she is feeling better than prior to admission. She has some mild shortness of breath still.  Her nausea and vomiting have improved.  She still reports a decreased appetite and reports a weight loss of about 20 pounds over the past 3 to 4 months.  She reports that at times she is off balance when walking.  She reports neuropathy to her fingertips and feet which started a few days ago.  She denies fevers, chills, chest pain, abdominal pain, headaches, bleeding.  The patient is divorced.  She had 2 daughters, 1 died last year.  Denies history of tobacco use.  Denies history of alcohol use.  Denies family history of malignancy or blood disorders.  Hematology was asked to the patient to make recommendations regarding her elevated urine IgG monoclonal protein with kappa light chain specificity, elevated serum light chains, and concern for amyloidosis  Past Medical History:  Diagnosis Date  . Hypertension   :  History reviewed. No pertinent surgical history.:   CURRENT MEDS: Current Facility-Administered Medications  Medication Dose Route Frequency Provider Last Rate Last Admin  . acetaminophen (TYLENOL) tablet 650 mg  650 mg Oral Q6H PRN Shalhoub, Sherryll Burger, MD       Or  . acetaminophen (TYLENOL) suppository 650 mg  650 mg Rectal Q6H PRN Shalhoub, Sherryll Burger, MD      . amLODipine (NORVASC) tablet 5 mg  5 mg Oral Daily Pahwani, Ravi, MD      . carvedilol (COREG) tablet 25 mg  25 mg Oral BID WC Donato Heinz, MD   25 mg at 03/09/21 0900  . enoxaparin (LOVENOX) injection 40 mg  40 mg Subcutaneous Q24H Shalhoub, Sherryll Burger, MD  40 mg at 03/09/21 1053  . escitalopram (LEXAPRO) tablet 10 mg  10 mg Oral Daily Shalhoub, Sherryll Burger, MD   10 mg at 03/09/21 1052  . famotidine (PEPCID) tablet 20 mg  20 mg Oral BID Oswald Hillock, MD   20 mg at 03/09/21 1053  . furosemide (LASIX) injection 80 mg  80 mg Intravenous Q12H Edrick Oh, MD   80 mg at 03/09/21 0347  . hydrALAZINE (APRESOLINE) injection 10 mg  10 mg  Intravenous Q6H PRN Vernelle Emerald, MD   10 mg at 03/05/21 1754  . ondansetron (ZOFRAN) tablet 4 mg  4 mg Oral Q6H PRN Shalhoub, Sherryll Burger, MD       Or  . ondansetron [facility]) injection 4 mg  4 mg Intravenous Q6H PRN Shalhoub, Sherryll Burger, MD   4 mg at 03/06/21 1756  . polyethylene glycol (MIRALAX / GLYCOLAX) packet 17 g  17 g Oral Daily PRN Shalhoub, Sherryll Burger, MD      . potassium chloride (KLOR-CON) packet 40 mEq  40 mEq Oral Q4H Pahwani, Einar Grad, MD      . promethazine (PHENERGAN) 12.5 mg in sodium chloride 0.9 % 50 mL IVPB  12.5 mg Intravenous Q6H PRN Shalhoub, Sherryll Burger, MD 200 mL/hr at 03/06/21 1306 12.5 mg at 03/06/21 1306      No Known Allergies:  Family History  Family history unknown: Yes  :  Social History   Socioeconomic History  . Marital status: Divorced    Spouse name: Not on file  . Number of children: Not on file  . Years of education: Not on file  . Highest education level: Not on file  Occupational History  . Not on file  Tobacco Use  . Smoking status: Never Smoker  . Smokeless tobacco: Never Used  Vaping Use  . Vaping Use: Never used  Substance and Sexual Activity  . Alcohol use: Not Currently  . Drug use: Never  . Sexual activity: Not on file  Other Topics Concern  . Not on file  Social History Narrative  . Not on file   Social Determinants of Health   Financial Resource Strain: Not on file  Food Insecurity: Not on file  Transportation Needs: Not on file  Physical Activity: Not on file  Stress: Not on file  Social Connections: Not on file  Intimate Partner Violence: Not on file  :  REVIEW OF SYSTEMS:  A comprehensive 14 point review of systems was negative except as noted in the HPI.    Exam: Patient Vitals for the past 24 hrs:  BP Temp Temp src Pulse Resp SpO2 Weight  03/09/21 1236 (!) 190/91 97.7 F (36.5 C) Oral (!) 58 16 96 % --  03/09/21 0500 -- -- -- -- -- -- 80.4 kg  03/09/21 0445 (!) 172/80 98.2 F (36.8 C) Oral 60 18 97 % --   03/09/21 0251 (!) 159/77 -- -- (!) 59 18 94 % --  03/08/21 2008 (!) 162/72 97.9 F (36.6 C) Oral (!) 55 18 (!) 89 % --  03/08/21 1620 (!) 181/87 -- -- (!) 57 -- -- --    General:  well-nourished in no acute distress.   Eyes:  no scleral icterus.   ENT:  There were no oropharyngeal lesions.    Lymphatics:  Negative cervical, supraclavicular or axillary adenopathy.   Respiratory: lungs were clear bilaterally without wheezing or crackles.   Cardiovascular:  Regular rate and rhythm, S1/S2, without murmur.  1+ bilateral lower extremity  edema. GI:  abdomen was soft, flat, nontender, nondistended, without organomegaly.   Musculoskeletal: Strength symmetrical in the upper and lower extremities. Skin exam was without ecchymosis, petechiae.   Neuro exam was nonfocal.  Patient was alert and oriented.  Attention was good.   Language was appropriate.  Mood was normal without depression.  Speech was not pressured.  Thought content was not tangential.    LABS:  Lab Results  Component Value Date   WBC 6.1 03/06/2021   HGB 8.8 (L) 03/06/2021   HCT 28.3 (L) 03/06/2021   PLT 234 03/06/2021   GLUCOSE 74 03/09/2021   ALT 14 03/06/2021   AST 24 03/06/2021   NA 136 03/09/2021   K 3.0 (L) 03/09/2021   CL 102 03/09/2021   CREATININE 1.82 (H) 03/09/2021   BUN 35 (H) 03/09/2021   CO2 26 03/09/2021    DG Chest 2 View  Result Date: 03/04/2021 CLINICAL DATA:  Hypertension. EXAM: CHEST - 2 VIEW COMPARISON:  None. FINDINGS: The heart size and mediastinal contours are within normal limits. Aortic arch calcifications. Streaky bibasilar airspace opacities. Increased interstitial markings. Trace right volume and small to moderate left volume pleural effusions. No pneumothorax. No acute osseous abnormality. IMPRESSION: Pulmonary edema with trace right and small to moderate left volume pleural effusions. Superimposed infection/inflammation not excluded. Followup PA and lateral chest X-ray is recommended in 3-4  weeks following therapy to ensure resolution. Electronically Signed   By: Iven Finn M.D.   On: 03/04/2021 22:40   CT ANGIO CHEST PE W OR WO CONTRAST  Result Date: 03/05/2021 CLINICAL DATA:  Pt presents from home with daughter, c/o n/v and SOB starting this evening. Denies CP, reports some abd discomfort. Last BM today. States she has not had home BP meds today. EXAM: CT ANGIOGRAPHY CHEST WITH CONTRAST TECHNIQUE: Multidetector CT imaging of the chest was performed using the standard protocol during bolus administration of intravenous contrast. Multiplanar CT image reconstructions and MIPs were obtained to evaluate the vascular anatomy. CONTRAST:  140m OMNIPAQUE IOHEXOL 350 MG/ML SOLN COMPARISON:  Chest x-ray 03/04/2021 FINDINGS: Cardiovascular: Satisfactory opacification of the pulmonary arteries to the segmental level. No evidence of pulmonary embolism. Normal heart size. No pericardial effusion. Aorta atherosclerotic plaque. Mediastinum/Nodes: No enlarged mediastinal, hilar, or axillary lymph nodes. Thyroid gland, trachea, and esophagus demonstrate no significant findings. Lungs/Pleura: Interlobular septal wall thickening. Left upper lobe atelectasis. Passive atelectasis bilateral lower lobes. No focal consolidation. Few scattered pulmonary micronodules. No pulmonary mass. Trace to small volume left and small to moderate volume right pleural effusions. No pneumothorax. Upper Abdomen: No acute abnormality. Musculoskeletal: No abdominal wall hernia or abnormality. No suspicious lytic or blastic osseous lesions. No acute displaced fracture. Multilevel degenerative changes of the spine. Levoscoliosis of the thoracolumbar spine. Review of the MIP images confirms the above findings. IMPRESSION: 1. No pulmonary embolus. 2. Pulmonary edema with trace to small volume left and small to moderate volume right pleural effusions. 3.  Aortic Atherosclerosis (ICD10-I70.0). Electronically Signed   By: MIven Finn M.D.   On: 03/05/2021 05:36   [place] (TRANSABDOMINAL ONLY)  Result Date: 03/08/2021 CLINICAL DATA:  Adnexal cyst. EXAM: TRANSABDOMINAL ULTRASOUND OF PELVIS TECHNIQUE: Transabdominal ultrasound examination of the pelvis was performed including evaluation of the uterus, ovaries, adnexal regions, and pelvic cul-de-sac. COMPARISON:  CT 03/08/2021. FINDINGS: Uterus Measurements: 8.7 x 4.8 x 5.9 cm = volume: 128.3 mL. No fibroids or other mass visualized. Endometrium Thickness: 5.6.  No focal abnormality visualized. Right ovary Measurements: Not visualized  Left ovary Measurements: 6.3 x 5.9 x 5.5 cm = volume: 106.7 mL. 4.5 x 6.0 x 6.6 cm simple cyst left ovary Other findings:  Small amount of free pelvic fluid. IMPRESSION: 6.6 cm simple cyst left ovary. This is almost certainly benign, but follow up ultrasound is recommended in 1 year according to the Society of Radiologists in [place] Statement (D Clovis Riley et al. Management of Asymptomatic Ovarian and Other Adnexal Cysts Imaged at [place]: Society of Radiologists in [place] Statement 2010. Radiology 256 (Sept 2010): 824-235.). Right ovary not visualized. Small amount of free pelvic fluid. Electronically Signed   By: Marcello Moores  Register   On: 03/08/2021 15:57   US RENAL  Result Date: 03/07/2021 CLINICAL DATA:  67 year old with acute kidney injury. EXAM: RENAL / URINARY TRACT ULTRASOUND COMPLETE COMPARISON:  Chest CT 03/05/2021 FINDINGS: Right Kidney: Renal measurements: 12.7 x 4.0 x 6.5 cm = volume: 168 mL. Normal echogenicity. Mild hydronephrosis. No suspicious renal lesion. Left Kidney: Renal measurements: 13.6 x 6.4 x 6.4 = volume: 290 mL. Normal echogenicity. Moderate left hydronephrosis. No suspicious renal lesion. Anechoic cyst in the mid left kidney that measures up to 5.3 cm. Bladder: Fluid in the urinary bladder.   Ureter jets are not identified. Other: Bilateral pleural effusions. IMPRESSION: 1. Bilateral  hydronephrosis. Moderate left hydronephrosis and mild right hydronephrosis. 2. Left renal cyst. 3. Bilateral pleural effusions. These results will be called to the ordering clinician or representative by the Radiologist Assistant, and communication documented in the PACS or [employer]. Electronically Signed   By: Markus Daft M.D.   On: 03/07/2021 16:37   DG Abd 2 Views  Result Date: 03/05/2021 CLINICAL DATA:  Nausea vomiting, mid abdominal discomfort since yesterday EXAM: ABDOMEN - 2 VIEW COMPARISON:  None. FINDINGS: The bowel gas pattern is normal. There is no evidence of free air. No radio-opaque calculi or other significant radiographic abnormality is seen. IMPRESSION: No evidence of bowel obstruction. Electronically Signed   By: Maurine Simmering   On: 03/05/2021 09:52   ECHOCARDIOGRAM COMPLETE  Result Date: 03/05/2021    ECHOCARDIOGRAM REPORT   Patient Name:   Kara Mullins Date of Exam: 03/05/2021 Medical Rec #:  [id number]      Height:       60.0 in Accession #:    [id number]     Weight:       160.0 lb Date of Birth:  1951/01/14     BSA:          1.698 m Patient Age:    9 years       BP:           182/85 mmHg Patient Gender: F              HR:           73 bpm. Exam Location:  Inpatient Procedure: 2D Echo, Cardiac Doppler and Color Doppler Indications:    I50.40* Unspecified combined systolic (congestive) and diastolic                 (congestive) heart failure  History:        Patient has no prior history of Echocardiogram examinations.                 Signs/Symptoms:Chest Pain. Elevated troponin. Elevated d dimer.  Sonographer:    Roseanna Rainbow RDCS Referring Phys: [id number] [place]  1. Left ventricular ejection fraction, by estimation, is 45%. The left ventricle has mildly decreased  function. The left ventricle demonstrates global hypokinesis. There is severe left ventricular hypertrophy. Left ventricular diastolic parameters are consistent with Grade I diastolic dysfunction  (impaired relaxation). Would consider cardiac amyloidosis.  2. Right ventricular systolic function is normal. The right ventricular size is normal. There is mildly elevated pulmonary artery systolic pressure. The estimated right ventricular systolic pressure is 26.3 mmHg.  3. Left atrial size was severely dilated.  4. The mitral valve is normal in structure. Mild to moderate mitral valve regurgitation. No evidence of mitral stenosis.  5. The aortic valve is tricuspid. Aortic valve regurgitation is trivial. No aortic stenosis is present.  6. Aortic dilatation noted. There is mild dilatation of the aortic root, measuring 37 mm.  7. The inferior vena cava is dilated in size with <50% respiratory variability, suggesting right atrial pressure of 15 mmHg.  8. There is a left pleural effusion and trivial pericardial effusion. FINDINGS  Left Ventricle: Left ventricular ejection fraction, by estimation, is 45%. The left ventricle has mildly decreased function. The left ventricle demonstrates global hypokinesis. The left ventricular internal cavity size was normal in size. There is severe left ventricular hypertrophy. Left ventricular diastolic parameters are consistent with Grade I diastolic dysfunction (impaired relaxation). Right Ventricle: The right ventricular size is normal. No increase in right ventricular wall thickness. Right ventricular systolic function is normal. There is mildly elevated pulmonary artery systolic pressure. The tricuspid regurgitant velocity is 2.57  m/s, and with an assumed right atrial pressure of 15 mmHg, the estimated right ventricular systolic pressure is 78.5 mmHg. Left Atrium: Left atrial size was severely dilated. Right Atrium: Right atrial size was normal in size. Pericardium: There is a left pleural effusion. Trivial pericardial effusion is present. Mitral Valve: The mitral valve is normal in structure. Mild mitral annular calcification. Mild to moderate mitral valve regurgitation. No  evidence of mitral valve stenosis. Tricuspid Valve: The tricuspid valve is normal in structure. Tricuspid valve regurgitation is trivial. Aortic Valve: The aortic valve is tricuspid. Aortic valve regurgitation is trivial. No aortic stenosis is present. Pulmonic Valve: The pulmonic valve was normal in structure. Pulmonic valve regurgitation is trivial. Aorta: Aortic dilatation noted. There is mild dilatation of the aortic root, measuring 37 mm. Venous: The inferior vena cava is dilated in size with less than 50% respiratory variability, suggesting right atrial pressure of 15 mmHg. IAS/Shunts: No atrial level shunt detected by color flow Doppler.  LEFT VENTRICLE PLAX 2D LVIDd:         5.02 cm      Diastology LVIDs:         3.73 cm      LV e' medial:    4.38 cm/s LV PW:         1.86 cm      LV E/e' medial:  20.4 LV IVS:        1.53 cm      LV e' lateral:   5.51 cm/s LVOT diam:     1.80 cm      LV E/e' lateral: 16.2 LV SV:         62 LV SV Index:   37 LVOT Area:     2.54 cm  LV Volumes (MOD) LV vol d, MOD A2C: 88.2 ml LV vol d, MOD A4C: 113.0 ml LV vol s, MOD A2C: 46.4 ml LV vol s, MOD A4C: 55.5 ml LV SV MOD A2C:     41.8 ml LV SV MOD A4C:     113.0 ml LV SV  MOD BP:      50.2 ml RIGHT VENTRICLE             IVC RV S prime:     14.10 cm/s  IVC diam: 2.58 cm TAPSE (M-mode): 2.3 cm LEFT ATRIUM             Index       RIGHT ATRIUM           Index LA diam:        3.30 cm 1.94 cm/m  RA Area:     13.60 cm LA Vol (A2C):   85.4 ml 50.30 ml/m RA Volume:   28.80 ml  16.96 ml/m LA Vol (A4C):   99.1 ml 58.37 ml/m LA Biplane Vol: 97.9 ml 57.67 ml/m  AORTIC VALVE LVOT Vmax:   110.00 cm/s LVOT Vmean:  80.500 cm/s LVOT VTI:    0.245 m  AORTA Ao Root diam: 3.70 cm Ao Asc diam:  3.50 cm MITRAL VALVE                TRICUSPID VALVE MV Area (PHT): 3.60 cm     TR Peak grad:   26.4 mmHg MV Decel Time: 211 msec     TR Vmax:        257.00 cm/s MR PISA:        0.57 cm MR PISA Radius: 0.30 cm     SHUNTS MV E velocity: 89.40 cm/s   Systemic  VTI:  0.24 m MV A velocity: 115.00 cm/s  Systemic Diam: 1.80 cm MV E/A ratio:  0.78 Loralie Champagne MD Electronically signed by Loralie Champagne MD Signature Date/Time: 03/05/2021/8:08:38 PM    Final    CT RENAL STONE STUDY  Result Date: 03/08/2021 CLINICAL DATA:  Hydronephrosis. EXAM: CT ABDOMEN AND PELVIS WITHOUT CONTRAST TECHNIQUE: Multidetector CT imaging of the abdomen and pelvis was performed following the standard protocol without IV contrast. COMPARISON:  Mar 07, 2021. FINDINGS: Lower chest: Large bilateral pleural effusions are noted with adjacent atelectasis of both lower lobes. Hepatobiliary: No focal liver abnormality is seen. No gallstones, gallbladder wall thickening, or biliary dilatation. Pancreas: Unremarkable. No pancreatic ductal dilatation or surrounding inflammatory changes. Spleen: Normal in size without focal abnormality. Adrenals/Urinary Tract: Adrenal glands appear normal. Small nonobstructive right renal calculus is noted. Moderate left hydronephrosis is noted without definite ureteral dilatation. Potentially this may be due to ureteropelvic junction obstruction. Urinary bladder contains contrast from previous CT scan, but is otherwise unremarkable. Stomach/Bowel: The stomach appears normal. There is no evidence of bowel obstruction or inflammation. Vascular/Lymphatic: Aortic atherosclerosis. No enlarged abdominal or pelvic lymph nodes. Reproductive: Uterus is unremarkable. Probable 6.3 cm left adnexal hyperdense or complex cyst is noted. Other: Moderate anasarca is noted. Moderate size fat containing periumbilical hernia is noted. Minimal ascites may be present in the pelvis. Musculoskeletal: No acute or significant osseous findings. IMPRESSION: Large bilateral pleural effusions are noted with adjacent atelectasis of both lower lobes. Moderate left hydronephrosis is noted without definite ureteral dilatation. Potentially this may related to ureteropelvic junction obstruction or stenosis. No  renal or ureteral calculi are noted. Small nonobstructive right renal calculus is noted. Probable 6.3 cm left adnexal hyperdense or complex cyst. Pelvic ultrasound is recommended for further evaluation. Moderate anasarca. Minimal ascites. Aortic Atherosclerosis (ICD10-I70.0). Electronically Signed   By: Marijo Conception M.D.   On: 03/08/2021 10:05   VAS [place] LOWER EXTREMITY VENOUS (DVT)  Result Date: 03/05/2021  Lower Venous DVT Study Patient Name:  Kara Mullins  Date  of Exam:   03/05/2021 Medical Rec #: [id number]       Accession #:    [id number] Date of Birth: [birth year]      Patient Gender: F Patient Age:   69Y Exam Location:  [facility] Procedure:      VAS [place] LOWER EXTREMITY VENOUS (DVT) Referring Phys: [id number] [place] --------------------------------------------------------------------------------  Indications: Edema.  Comparison Study: No previous exams. Performing Technologist: Rogelia Rohrer  Examination Guidelines: A complete evaluation includes B-mode imaging, spectral Doppler, color Doppler, and power Doppler as needed of all accessible portions of each vessel. Bilateral testing is considered an integral part of a complete examination. Limited examinations for reoccurring indications may be performed as noted. The reflux portion of the exam is performed with the patient in reverse Trendelenburg.  +---------+---------------+---------+-----------+----------+--------------+ RIGHT    CompressibilityPhasicitySpontaneityPropertiesThrombus Aging +---------+---------------+---------+-----------+----------+--------------+ CFV      Full           Yes      Yes                                 +---------+---------------+---------+-----------+----------+--------------+ SFJ      Full                                                        +---------+---------------+---------+-----------+----------+--------------+ FV Prox  Full           Yes      Yes                                  +---------+---------------+---------+-----------+----------+--------------+ FV Mid   Full           Yes      Yes                                 +---------+---------------+---------+-----------+----------+--------------+ FV DistalFull           Yes      Yes                                 +---------+---------------+---------+-----------+----------+--------------+ PFV      Full                                                        +---------+---------------+---------+-----------+----------+--------------+ POP      Full           Yes      Yes                                 +---------+---------------+---------+-----------+----------+--------------+ PTV      Full                                                        +---------+---------------+---------+-----------+----------+--------------+  PERO     Full                                                        +---------+---------------+---------+-----------+----------+--------------+   +---------+---------------+---------+-----------+----------+--------------+ LEFT     CompressibilityPhasicitySpontaneityPropertiesThrombus Aging +---------+---------------+---------+-----------+----------+--------------+ CFV      Full           Yes      Yes                                 +---------+---------------+---------+-----------+----------+--------------+ SFJ      Full                                                        +---------+---------------+---------+-----------+----------+--------------+ FV Prox  Full           Yes      Yes                                 +---------+---------------+---------+-----------+----------+--------------+ FV Mid   Full           Yes      Yes                                 +---------+---------------+---------+-----------+----------+--------------+ FV DistalFull           Yes      Yes                                  +---------+---------------+---------+-----------+----------+--------------+ PFV      Full                                                        +---------+---------------+---------+-----------+----------+--------------+ POP      Full           Yes      Yes                                 +---------+---------------+---------+-----------+----------+--------------+ PTV      Full                                                        +---------+---------------+---------+-----------+----------+--------------+ PERO     Full                                                        +---------+---------------+---------+-----------+----------+--------------+  Summary: BILATERAL: - No evidence of deep vein thrombosis seen in the lower extremities, bilaterally. - No evidence of superficial venous thrombosis in the lower extremities, bilaterally. -No evidence of popliteal cyst, bilaterally. -Significant subcutaneous edema found throughout lower extremities.  *See table(s) above for measurements and observations. Electronically signed by Monica Martinez MD on 03/05/2021 at 3:29:23 PM.    Final      ASSESSMENT AND PLAN:  1.  Elevated serum and urine light chains 2.  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  Acute on chronic combined CHF 4.  AKI 5.  Hydronephrosis 6.  Hypertension  -Discussed work-up to date with the patient and her family members.  We discussed concern for AL amyloidosis.  Will likely need fat pad biopsy to confirm diagnosis.  She will be seen by Dr. Marin Olp later today to discuss any additional work-up.  -We will follow-up on SPEP results once available. -Management of heart failure per cardiology. -Management of AKI per nephrology. -Urology has seen for hydronephrosis and no interventions planned this admission.  They plan to follow-up with a renal ultrasound in 4 to 6 weeks as an outpatient.  Thank you for this referral.  Mikey Bussing, DNP, AGPCNP-BC,  AOCNP  ADDENDUM: I saw and examined Kara Mullins.  I agree with the above assessment by Erasmo Downer.  Think the question really is going to be whether or not this is AL amyloidosis.  She had the spot urine which showed an IgG kappa protein.  She will certainly need to have a 24-hour urine done.  She will clearly need to have a bone marrow biopsy done.  The serum light chains was not all that impressive.  I would have thought that she would have had a low lambda light chain.  I do think that she is going to need to have a rectal biopsy or abdominal fat pad biopsy to try to help establish a diagnosis of AL amyloidosis.  She still has other risk factors for cardiomyopathy.  She has had longstanding hypertension.  She has lost quite a bit of weight however.  I do not see anything else on her exam that looks like amyloidosis.  She does not have an enlarged tongue.  She had no problems swallowing or speaking.  I suppose it certainly is a possibility that AL amyloidosis is present.  She still is very early on in the work-up stage.  Again, she needs to have the 24-hour urine done.  She is to have a bone marrow biopsy and aspirate done.  I do think a rectal biopsy would not be a bad idea.  She has never had a colonoscopy.  She is iron deficient.  Again I think this would warrant a colonoscopy.  She is anemic.  I suspect that she probably has a low erythropoietin level.  It would be nice to get a reticulocyte count on her.  There certainly could be various factors involved here.  We will follow along and help out as much as possible.  We had a very good prayer session at the end of our meeting.  Her daughter was there also.  I very much enjoyed the faith and fellowship that we had.   Lattie Haw, MD  2 Chronicles 7:14

## 2021-03-10 ENCOUNTER — Inpatient Hospital Stay (HOSPITAL_COMMUNITY): Payer: Medicare Other

## 2021-03-10 DIAGNOSIS — I5031 Acute diastolic (congestive) heart failure: Secondary | ICD-10-CM | POA: Diagnosis not present

## 2021-03-10 DIAGNOSIS — N179 Acute kidney failure, unspecified: Secondary | ICD-10-CM | POA: Diagnosis not present

## 2021-03-10 LAB — CBC WITH DIFFERENTIAL/PLATELET
Abs Immature Granulocytes: 0.01 10*3/uL (ref 0.00–0.07)
Basophils Absolute: 0 10*3/uL (ref 0.0–0.1)
Basophils Relative: 1 %
Eosinophils Absolute: 0.1 10*3/uL (ref 0.0–0.5)
Eosinophils Relative: 3 %
HCT: 26.7 % — ABNORMAL LOW (ref 36.0–46.0)
Hemoglobin: 8.5 g/dL — ABNORMAL LOW (ref 12.0–15.0)
Immature Granulocytes: 0 %
Lymphocytes Relative: 23 %
Lymphs Abs: 1.1 10*3/uL (ref 0.7–4.0)
MCH: 26.8 pg (ref 26.0–34.0)
MCHC: 31.8 g/dL (ref 30.0–36.0)
MCV: 84.2 fL (ref 80.0–100.0)
Monocytes Absolute: 0.5 10*3/uL (ref 0.1–1.0)
Monocytes Relative: 10 %
Neutro Abs: 3.2 10*3/uL (ref 1.7–7.7)
Neutrophils Relative %: 63 %
Platelets: 260 10*3/uL (ref 150–400)
RBC: 3.17 MIL/uL — ABNORMAL LOW (ref 3.87–5.11)
RDW: 14.3 % (ref 11.5–15.5)
WBC: 5 10*3/uL (ref 4.0–10.5)
nRBC: 0 % (ref 0.0–0.2)

## 2021-03-10 LAB — COMPREHENSIVE METABOLIC PANEL
ALT: 13 U/L (ref 0–44)
AST: 15 U/L (ref 15–41)
Albumin: 2.1 g/dL — ABNORMAL LOW (ref 3.5–5.0)
Alkaline Phosphatase: 75 U/L (ref 38–126)
Anion gap: 4 — ABNORMAL LOW (ref 5–15)
BUN: 30 mg/dL — ABNORMAL HIGH (ref 8–23)
CO2: 34 mmol/L — ABNORMAL HIGH (ref 22–32)
Calcium: 8.2 mg/dL — ABNORMAL LOW (ref 8.9–10.3)
Chloride: 102 mmol/L (ref 98–111)
Creatinine, Ser: 1.56 mg/dL — ABNORMAL HIGH (ref 0.44–1.00)
GFR, Estimated: 36 mL/min — ABNORMAL LOW (ref >60)
Glucose, Bld: 78 mg/dL (ref 70–99)
Potassium: 3.7 mmol/L (ref 3.5–5.1)
Sodium: 140 mmol/L (ref 135–145)
Total Bilirubin: 0.6 mg/dL (ref 0.3–1.2)
Total Protein: 5.6 g/dL — ABNORMAL LOW (ref 6.5–8.1)

## 2021-03-10 LAB — RETICULOCYTES
Immature Retic Fract: 20.8 % — ABNORMAL HIGH (ref 2.3–15.9)
RBC.: 3.26 MIL/uL — ABNORMAL LOW (ref 3.87–5.11)
Retic Count, Absolute: 98.1 10*3/uL (ref 19.0–186.0)
Retic Ct Pct: 3 % (ref 0.4–3.1)

## 2021-03-10 MED ORDER — FUROSEMIDE 10 MG/ML IJ SOLN
40.0000 mg | Freq: Two times a day (BID) | INTRAMUSCULAR | Status: DC
  Administered 2021-03-10 – 2021-03-11 (×2): 40 mg via INTRAVENOUS
  Filled 2021-03-10 (×2): qty 4

## 2021-03-10 NOTE — Progress Notes (Signed)
[facility] Progress Note   70 y.o. female HTN lymphedema anxiety disorder, HFrEF  echo 03/06/2021 EF 40%. with diastolic dysfunction.  She has baseline Cr ~1.3 mg/dL.  And since admission she has gradually had worsening of her creatinine. She presented to the emergency room 03/04/2021.  She had the complaints of nausea vomiting with increasing lower extremity swelling. Underwent CT scan with 100 cc of contrast administered 03/05/2021.  This was for rule out pulmonary embolus. Moerate left hydronephrosis without definite ureteral dilatation. In addition a left adnexal complex cyst noted. Urine shows greater than 300 protein.  6-10 RBCs per high-powered film with no WBCs noted.  Assessment/ Plan:   1. Acute kidney injury.  Hydronephrosis noted on CT scan.  Would recommend consultation with urology.  It is also noted that there is both proteinuria and some hematuria.   We will send SPEP and UPEP. K/L light chain ratio. There is a consideration for cardiac amyloidosis.  We will also check hepatitis, complement, ANA and ANCA. Her baseline creatinine is not normal but runs about 1.3 mg/dL which would be stage III chronic kidney disease. The cause of her acute on chronic renal disease could potentially be the use of IV contrast that was administered 03/05/2021 + diureses leading to some acute tubular necrosis.   - Would continue to avoid nephrotoxins , including IV contrast,ACE inhibitors and ARB's.  Renally adjust medications.   Renal function  improving with diuresis.  IgG Kappa spike.  Would continue to diurese as she's volume overloaded and monitor response. Continues to have very brisk UOP (6150 overnight) -> decrease to '40mg'$  BID and should be able to transition to orals tomorrow.  Appreciate urology input; no acute therapy necessary.   Signing off at this time; please reconsult as needed.  2. Hypertension/volume  - clinically appears to be volume overloaded.  Would recommend diuresis  with Lasix.  Will decrease to 40 mg IV every 12 hours.  Good diuretic response with this dose. 3. Anemia  We will check iron studies.  Evaluate for ESA 4. Congestive heart failure with systolic dysfunction.  As per cardiology.  Ejection fraction 40/45% consideration for cardiac amyloid. 5. Left adnexal complex cyst.  Pelvic ultrasound pending.  Subjective:   Breathing fairly comfortably unless walking. Denies f/c/n/v/ chills.   Objective:   BP (!) 174/83 (BP Location: Left Arm)   Pulse (!) 53   Temp 98.3 F (36.8 C) (Oral)   Resp 18   Wt 77.9 kg   SpO2 96%   Intake/Output Summary (Last 24 hours) at 03/10/2021 1046 Last data filed at 03/10/2021 0600 Gross per 24 hour  Intake 240 ml  Output 4900 ml  Net -4660 ml   Weight change: -2.5 kg  Physical Exam: General: elderly lady in no obvious distress, breathing comfortably Neck: JVP elevated neck otherwise supple Heart: regular rate and rhythm  Lungs:clear, no  crackles noted Abdomen: soft nontender nondistended Extremities: 2+ edema noted in lower extremities Skin: no skin rashes noted Neuro: nonfocal  Imaging: US PELVIS (TRANSABDOMINAL ONLY)  Result Date: 03/08/2021 CLINICAL DATA:  Adnexal cyst. EXAM: TRANSABDOMINAL ULTRASOUND OF PELVIS TECHNIQUE: Transabdominal ultrasound examination of the pelvis was performed including evaluation of the uterus, ovaries, adnexal regions, and pelvic cul-de-sac. COMPARISON:  CT 03/08/2021. FINDINGS: Uterus Measurements: 8.7 x 4.8 x 5.9 cm = volume: 128.3 mL. No fibroids or other mass visualized. Endometrium Thickness: 5.6.  No focal abnormality visualized. Right ovary Measurements: Not visualized Left ovary Measurements: 6.3 x 5.9 x 5.5  cm = volume: 106.7 mL. 4.5 x 6.0 x 6.6 cm simple cyst left ovary Other findings:  Small amount of free pelvic fluid. IMPRESSION: 6.6 cm simple cyst left ovary. This is almost certainly benign, but follow up ultrasound is recommended in 1 year according to the Society  of Radiologists in [place] Statement (D Clovis Riley et al. Management of Asymptomatic Ovarian and Other Adnexal Cysts Imaged at [place]: Society of Radiologists in [place] Statement 2010. Radiology 256 (Sept 2010): [id number].). Right ovary not visualized. Small amount of free pelvic fluid. Electronically Signed   ByMarcello Moores  Register   On: 03/08/2021 15:57    Labs: BMET Recent Labs  Lab 03/04/21 2144 03/05/21 [id number] 03/06/21 0445 03/07/21 0441 03/08/21 0431 03/09/21 0501 03/10/21 0413  NA 137 138 141 141 139 136 140  K 3.9 4.0 3.9 3.6 3.5 3.0* 3.7  CL 105 107 110 106 104 102 102  CO2 '25 24 24 28 28 26 '$ 34*  GLUCOSE 127* 130* 112* 99 91 74 78  BUN 26* 27* 30* 33* 35* 35* 30*  CREATININE 1.29* 1.35* 1.59* 1.92* 1.98* 1.82* 1.56*  CALCIUM 8.6* 8.3* 8.4* 8.2* 8.1* 8.1* 8.2*   CBC Recent Labs  Lab 03/04/21 2144 03/05/21 0842 03/06/21 0445 03/10/21 0413  WBC 5.5 5.3 6.1 5.0  NEUTROABS 4.7 4.2  --  3.2  HGB 10.1* 9.5* 8.8* 8.5*  HCT 32.9* 29.7* 28.3* 26.7*  MCV 85.7 84.6 85.5 84.2  PLT 265 234 234 260    Medications:    . amLODipine  5 mg Oral Daily  . carvedilol  25 mg Oral BID WC  . enoxaparin (LOVENOX) injection  40 mg Subcutaneous Q24H  . escitalopram  10 mg Oral Daily  . famotidine  20 mg Oral BID  . furosemide  80 mg Intravenous Q12H      Otelia Santee, MD 03/10/2021, 10:46 AM

## 2021-03-10 NOTE — Progress Notes (Signed)
PROGRESS NOTE    Kara Mullins  [id number] DOB: 1951/06/06 DOA: 03/04/2021 PCP: Forrest Moron, MD   Brief Narrative:  70 year old female with past medical history of hypertension, lymphedema, anxiety disorder who presented to the ED with multiple complaints including shortness of breath, cough, peripheral edema, nausea and vomiting. Patient also has been experiencing several month history of increasing bilateral lower extremity edema. Over the past 24 hours she developed nausea and frequent bouts of vomiting.  Patient was admitted with acute combined CHF exacerbation, intractable nausea and vomiting.  Cardiology consulted.  SBO ruled out.  Assessment & Plan:   Principal Problem:   Acute CHF (congestive heart failure) (HCC) Active Problems:   Hypertensive urgency   Nausea and vomiting   Elevated troponin level not due myocardial infarction   Elevated d-dimer   Generalized anxiety disorder   Acute congestive heart failure (HCC)   CHF exacerbation (HCC)   Essential hypertension   AKI (acute kidney injury) ([place])   New/acute combined systolic and diastolic congestive heart failure: -echocardiogram showed global hypokinesis of left ventricle, EF [id number], grade 1 diastolic dysfunction.  Consider cardiac amyloidosis. Cardiology on board.  They suspect cardiac amyloidosis and they have initiated the work-up.  Patient to go to [facility] to get cardiac MRI but that is being canceled every day due to GFR being less than 30.  Lasix at 80 mg IV twice daily was resumed by nephrology.  Patient has had robust urine output in last 48 hours.  Lasix is being decreased to 40 mg IV twice daily by both nephrology and cardiology.  Acute kidney injury: 1.29> 1.35> 1.59> 1.92> 1.98> 1.8> 1.56.  Somewhat improvement in creatinine today.  Robust urine output.  Lasix being decreased.  Hypokalemia: Resolved.  Left adnexal complex cyst: Ultrasound confirmed benign cyst.  Nausea vomiting: Symptoms  resolved.  Abdominal x-ray negative for SBO.  Essential hypertension: Blood pressure fairly controlled.  Continue Coreg.  Elevated D-dimer: Ruled out of PE and DVT.  Possible AL amyloidosis: Oncology consulted.  Work-up and management defer to them.   DVT prophylaxis: enoxaparin (LOVENOX) injection 40 mg Start: 03/05/21 1000   Code Status: Full Code  Family Communication: None present at bedside.  Plan of care discussed with patient.  Status is: Inpatient  Remains inpatient appropriate because:Inpatient level of care appropriate due to severity of illness   Dispo: The patient is from: Home              Anticipated d/c is to: Home              Patient currently is not medically stable to d/c.   Difficult to place patient No        There is no height or weight on file to calculate BMI.      Nutritional status:               Consultants:   Cardiology  Procedures:     Antimicrobials:  Anti-infectives (From admission, onward)   None         Subjective: Seen and examined.  She has no complaints.  Feels better today.  Objective: Vitals:   03/09/21 2027 03/10/21 0427 03/10/21 0500 03/10/21 0913  BP: (!) 176/87 (!) 101/58  (!) 174/83  Pulse: (!) 56 (!) 51  (!) 53  Resp: 18 18    Temp: 97.7 F (36.5 C) 98.3 F (36.8 C)    TempSrc: Oral Oral    SpO2: 95% 96%    Weight:  77.9 kg     Intake/Output Summary (Last 24 hours) at 03/10/2021 1155 Last data filed at 03/10/2021 1057 Gross per 24 hour  Intake 240 ml  Output 5700 ml  Net -5460 ml   Filed Weights   03/07/21 0541 03/09/21 0500 03/10/21 0500  Weight: 81.6 kg 80.4 kg 77.9 kg    Examination:  General exam: Appears calm and comfortable  Respiratory system: Clear to auscultation. Respiratory effort normal. Cardiovascular system: S1 & S2 heard, RRR. No JVD, murmurs, rubs, gallops or clicks.  +1-2 pitting edema bilateral lower extremity. Gastrointestinal system: Abdomen is nondistended,  soft and nontender. No organomegaly or masses felt. Normal bowel sounds heard. Central nervous system: Alert and oriented. No focal neurological deficits. Extremities: Symmetric 5 x 5 power. Skin: No rashes, lesions or ulcers.  Psychiatry: Judgement and insight appear normal. Mood & affect appropriate.    Data Reviewed: I have personally reviewed following labs and imaging studies  CBC: Recent Labs  Lab 03/04/21 2144 03/05/21 0842 03/06/21 0445 03/10/21 0413  WBC 5.5 5.3 6.1 5.0  NEUTROABS 4.7 4.2  --  3.2  HGB 10.1* 9.5* 8.8* 8.5*  HCT 32.9* 29.7* 28.3* 26.7*  MCV 85.7 84.6 85.5 84.2  PLT 265 234 234 [id number]   Basic Metabolic Panel: Recent Labs  Lab 03/05/21 0842 03/06/21 0445 03/07/21 0441 03/08/21 0431 03/09/21 0501 03/10/21 0413  NA 138 141 141 139 136 140  K 4.0 3.9 3.6 3.5 3.0* 3.7  CL 107 110 106 104 102 102  CO2 '24 24 28 28 26 '$ 34*  GLUCOSE 130* 112* 99 91 74 78  BUN 27* 30* 33* 35* 35* 30*  CREATININE 1.35* 1.59* 1.92* 1.98* 1.82* 1.56*  CALCIUM 8.3* 8.4* 8.2* 8.1* 8.1* 8.2*  MG 2.3  --   --   --   --   --    GFR: CrCl cannot be calculated (Unknown ideal weight.). Liver Function Tests: Recent Labs  Lab 03/04/21 2144 03/05/21 0842 03/06/21 0445 03/10/21 0413  AST '29 28 24 15  '$ ALT '17 14 14 13  '$ ALKPHOS 107 91 82 75  BILITOT 0.7 0.5 0.7 0.6  PROT 7.4 6.1* 5.8* 5.6*  ALBUMIN 3.1* 2.6* 2.5* 2.1*   Recent Labs  Lab 03/04/21 2144  LIPASE 22   No results for input(s): AMMONIA in the last 168 hours. Coagulation Profile: No results for input(s): INR, PROTIME in the last 168 hours. Cardiac Enzymes: No results for input(s): CKTOTAL, CKMB, CKMBINDEX, TROPONINI in the last 168 hours. BNP (last 3 results) No results for input(s): PROBNP in the last 8760 hours. HbA1C: No results for input(s): HGBA1C in the last 72 hours. CBG: No results for input(s): GLUCAP in the last 168 hours. Lipid Profile: No results for input(s): CHOL, HDL, LDLCALC, TRIG, CHOLHDL,  LDLDIRECT in the last 72 hours. Thyroid Function Tests: No results for input(s): TSH, T4TOTAL, FREET4, T3FREE, THYROIDAB in the last 72 hours. Anemia Panel: Recent Labs    03/08/21 1535 03/10/21 0413  TIBC 162*  --   IRON 14*  --   RETICCTPCT  --  3.0   Sepsis Labs: No results for input(s): PROCALCITON, LATICACIDVEN in the last 168 hours.  Recent Results (from the past 240 hour(s))  Resp Panel by RT-PCR (Flu A&B, Covid) Nasopharyngeal Swab     Status: None   Collection Time: 03/05/21  4:07 AM   Specimen: Nasopharyngeal Swab; Nasopharyngeal(NP) swabs in vial transport medium  Result Value Ref Range Status   SARS Coronavirus 2 by RT  PCR NEGATIVE NEGATIVE Final    Comment: (NOTE) SARS-CoV-2 target nucleic acids are NOT DETECTED.  The SARS-CoV-2 RNA is generally detectable in upper respiratory specimens during the acute phase of infection. The lowest concentration of SARS-CoV-2 viral copies this assay can detect is 138 copies/mL. A negative result does not preclude SARS-Cov-2 infection and should not be used as the sole basis for treatment or other patient management decisions. A negative result may occur with  improper specimen collection/handling, submission of specimen other than nasopharyngeal swab, presence of viral mutation(s) within the areas targeted by this assay, and inadequate number of viral copies(<138 copies/mL). A negative result must be combined with clinical observations, patient history, and epidemiological information. The expected result is Negative.  Fact Sheet for Patients:  EntrepreneurPulse.com.au  Fact Sheet for Healthcare Providers:  IncredibleEmployment.be  This test is no t yet approved or cleared by the [place] FDA and  has been authorized for detection and/or diagnosis of SARS-CoV-2 by FDA under an Emergency Use Authorization (EUA). This EUA will remain  in effect (meaning this test can be used) for the  duration of the COVID-19 declaration under Section 564(b)(1) of the Act, 21 U.S.C.section 360bbb-3(b)(1), unless the authorization is terminated  or revoked sooner.       Influenza A by PCR NEGATIVE NEGATIVE Final   Influenza B by PCR NEGATIVE NEGATIVE Final    Comment: (NOTE) The Xpert Xpress SARS-CoV-2/FLU/RSV plus assay is intended as an aid in the diagnosis of influenza from Nasopharyngeal swab specimens and should not be used as a sole basis for treatment. Nasal washings and aspirates are unacceptable for Xpert Xpress SARS-CoV-2/FLU/RSV testing.  Fact Sheet for Patients: EntrepreneurPulse.com.au  Fact Sheet for Healthcare Providers: IncredibleEmployment.be  This test is not yet approved or cleared by the [place] FDA and has been authorized for detection and/or diagnosis of SARS-CoV-2 by FDA under an Emergency Use Authorization (EUA). This EUA will remain in effect (meaning this test can be used) for the duration of the COVID-19 declaration under Section 564(b)(1) of the Act, 21 U.S.C. section 360bbb-3(b)(1), unless the authorization is terminated or revoked.  Performed at [facility], [place] [address] [place], [place] [address]       Radiology Studies: US PELVIS (TRANSABDOMINAL ONLY)  Result Date: 03/08/2021 CLINICAL DATA:  Adnexal cyst. EXAM: TRANSABDOMINAL ULTRASOUND OF PELVIS TECHNIQUE: Transabdominal ultrasound examination of the pelvis was performed including evaluation of the uterus, ovaries, adnexal regions, and pelvic cul-de-sac. COMPARISON:  CT 03/08/2021. FINDINGS: Uterus Measurements: 8.7 x 4.8 x 5.9 cm = volume: 128.3 mL. No fibroids or other mass visualized. Endometrium Thickness: 5.6.  No focal abnormality visualized. Right ovary Measurements: Not visualized Left ovary Measurements: 6.3 x 5.9 x 5.5 cm = volume: 106.7 mL. 4.5 x 6.0 x 6.6 cm simple cyst left ovary Other findings:  Small amount of free  pelvic fluid. IMPRESSION: 6.6 cm simple cyst left ovary. This is almost certainly benign, but follow up ultrasound is recommended in 1 year according to the Society of Radiologists in [place] Statement (D Clovis Riley et al. Management of Asymptomatic Ovarian and Other Adnexal Cysts Imaged at [place]: Society of Radiologists in [place] Statement 2010. Radiology 256 (Sept 2010): [id number].). Right ovary not visualized. Small amount of free pelvic fluid. Electronically Signed   By: [place]   On: 03/08/2021 15:57    Scheduled Meds: . amLODipine  5 mg Oral Daily  . carvedilol  25 mg Oral BID WC  . enoxaparin (  LOVENOX) injection  40 mg Subcutaneous Q24H  . escitalopram  10 mg Oral Daily  . famotidine  20 mg Oral BID  . furosemide  40 mg Intravenous Q12H   Continuous Infusions: . promethazine (PHENERGAN) injection (IM or IVPB) 12.5 mg (03/06/21 1306)     LOS: 4 days   Time spent: 27 minutes   Darliss Cheney, MD [facility]  03/10/2021, 11:55 AM   How to contact the [facility] Attending or Consulting provider [place] or covering provider during after hours [place], for this patient?  1. Check the care team in [facility] and look for a) attending/consulting [facility] provider listed and b) the [facility] team listed. Page or secure chat [phone]. 2. Log into [email or web address] and use [place]'s universal password to access. If you do not have the password, please contact the hospital operator. 3. Locate the [facility] provider you are looking for under [facility] and page to a number that you can be directly reached. 4. If you still have difficulty reaching the provider, please page the [facility] (Director on Call) for the Hospitalists listed on amion for assistance.

## 2021-03-10 NOTE — Progress Notes (Signed)
Progress Note  Patient Name: Kara Mullins Date of Encounter: 03/10/2021  Primary Cardiologist: New   Patient Profile     70 y.o. female with a history of hypertension, lymphedema, and anxietyseen for CHF with modest LV dysfunction EF 45%/severe LVH ( low voltage ECG) ? Amyloid with modest renal insuff IgG spike Kappa >> heme consult >> needs BM+/- rectal Bx, 24 hr urine, colonoscopy for FeDef Anemia,  Hydronephrosis uro following   Subjective   Feels the best she has all week.  Minimal shortness of breath.  No edema.  Inpatient Medications    Scheduled Meds: . amLODipine  5 mg Oral Daily  . carvedilol  25 mg Oral BID WC  . enoxaparin (LOVENOX) injection  40 mg Subcutaneous Q24H  . escitalopram  10 mg Oral Daily  . famotidine  20 mg Oral BID  . furosemide  80 mg Intravenous Q12H   Continuous Infusions: . promethazine (PHENERGAN) injection (IM or IVPB) 12.5 mg (03/06/21 1306)   PRN Meds: acetaminophen **OR** acetaminophen, hydrALAZINE, ondansetron **OR** ondansetron (ZOFRAN) IV, polyethylene glycol, promethazine (PHENERGAN) injection (IM or IVPB)   Vital Signs    Vitals:   03/09/21 1726 03/09/21 2027 03/10/21 0427 03/10/21 0500  BP:  (!) 176/87 (!) 101/58   Pulse: (!) 54 (!) 56 (!) 51   Resp:  18 18   Temp:  97.7 F (36.5 C) 98.3 F (36.8 C)   TempSrc:  Oral Oral   SpO2:  95% 96%   Weight:    77.9 kg    Intake/Output Summary (Last 24 hours) at 03/10/2021 0804 Last data filed at 03/10/2021 0600 Gross per 24 hour  Intake 480 ml  Output 5600 ml  Net -5120 ml   Filed Weights   03/07/21 0541 03/09/21 0500 03/10/21 0500  Weight: 81.6 kg 80.4 kg 77.9 kg    Physical Exam   Well developed and nourished in no acute distress HENT normal Neck supple with JVP-8-10 Clear Regular rate and rhythm, no murmurs or gallops Abd-soft with active BS No Clubbing cyanosis edema Skin-warm and dry A & Oriented  Grossly normal sensory and motor function  *   Labs     Chemistry Recent Labs  Lab 03/05/21 0842 03/06/21 0445 03/07/21 0441 03/08/21 0431 03/09/21 0501 03/10/21 0413  NA 138 141   < > 139 136 140  K 4.0 3.9   < > 3.5 3.0* 3.7  CL 107 110   < > 104 102 102  CO2 24 24   < > 28 26 34*  GLUCOSE 130* 112*   < > 91 74 78  BUN 27* 30*   < > 35* 35* 30*  CREATININE 1.35* 1.59*   < > 1.98* 1.82* 1.56*  CALCIUM 8.3* 8.4*   < > 8.1* 8.1* 8.2*  PROT 6.1* 5.8*  --   --   --  5.6*  ALBUMIN 2.6* 2.5*  --   --   --  2.1*  AST 28 24  --   --   --  15  ALT 14 14  --   --   --  13  ALKPHOS 91 82  --   --   --  75  BILITOT 0.5 0.7  --   --   --  0.6  GFRNONAA 43* 35*   < > 27* 30* 36*  ANIONGAP 7 7   < > 7 8 4*   < > = values in this interval not displayed.  Hematology Recent Labs  Lab 03/05/21 0842 03/06/21 0445 03/10/21 0413  WBC 5.3 6.1 5.0  RBC 3.51* 3.31* 3.17*  3.26*  HGB 9.5* 8.8* 8.5*  HCT 29.7* 28.3* 26.7*  MCV 84.6 85.5 84.2  MCH 27.1 26.6 26.8  MCHC 32.0 31.1 31.8  RDW 14.9 14.9 14.3  PLT 234 234 260    Cardiac EnzymesNo results for input(s): TROPONINI in the last 168 hours. No results for input(s): TROPIPOC in the last 168 hours.   BNP Recent Labs  Lab 03/04/21 2258  BNP 1,196.9*     DDimer  Recent Labs  Lab 03/04/21 2312  DDIMER 6.36*     Radiology    US PELVIS (TRANSABDOMINAL ONLY)  Result Date: 03/08/2021 CLINICAL DATA:  Adnexal cyst. EXAM: TRANSABDOMINAL ULTRASOUND OF PELVIS TECHNIQUE: Transabdominal ultrasound examination of the pelvis was performed including evaluation of the uterus, ovaries, adnexal regions, and pelvic cul-de-sac. COMPARISON:  CT 03/08/2021. FINDINGS: Uterus Measurements: 8.7 x 4.8 x 5.9 cm = volume: 128.3 mL. No fibroids or other mass visualized. Endometrium Thickness: 5.6.  No focal abnormality visualized. Right ovary Measurements: Not visualized Left ovary Measurements: 6.3 x 5.9 x 5.5 cm = volume: 106.7 mL. 4.5 x 6.0 x 6.6 cm simple cyst left ovary Other findings:  Small amount of  free pelvic fluid. IMPRESSION: 6.6 cm simple cyst left ovary. This is almost certainly benign, but follow up ultrasound is recommended in 1 year according to the Society of Radiologists in [place] Statement (D Clovis Riley et al. Management of Asymptomatic Ovarian and Other Adnexal Cysts Imaged at [place]: Society of Radiologists in [place] Statement 2010. Radiology 256 (Sept 2010): [id number].). Right ovary not visualized. Small amount of free pelvic fluid. Electronically Signed   By: Marcello Moores  Register   On: 03/08/2021 15:57   CT RENAL STONE STUDY  Result Date: 03/08/2021 CLINICAL DATA:  Hydronephrosis. EXAM: CT ABDOMEN AND PELVIS WITHOUT CONTRAST TECHNIQUE: Multidetector CT imaging of the abdomen and pelvis was performed following the standard protocol without IV contrast. COMPARISON:  Mar 07, 2021. FINDINGS: Lower chest: Large bilateral pleural effusions are noted with adjacent atelectasis of both lower lobes. Hepatobiliary: No focal liver abnormality is seen. No gallstones, gallbladder wall thickening, or biliary dilatation. Pancreas: Unremarkable. No pancreatic ductal dilatation or surrounding inflammatory changes. Spleen: Normal in size without focal abnormality. Adrenals/Urinary Tract: Adrenal glands appear normal. Small nonobstructive right renal calculus is noted. Moderate left hydronephrosis is noted without definite ureteral dilatation. Potentially this may be due to ureteropelvic junction obstruction. Urinary bladder contains contrast from previous CT scan, but is otherwise unremarkable. Stomach/Bowel: The stomach appears normal. There is no evidence of bowel obstruction or inflammation. Vascular/Lymphatic: Aortic atherosclerosis. No enlarged abdominal or pelvic lymph nodes. Reproductive: Uterus is unremarkable. Probable 6.3 cm left adnexal hyperdense or complex cyst is noted. Other: Moderate anasarca is noted. Moderate size fat containing periumbilical hernia is  noted. Minimal ascites may be present in the pelvis. Musculoskeletal: No acute or significant osseous findings. IMPRESSION: Large bilateral pleural effusions are noted with adjacent atelectasis of both lower lobes. Moderate left hydronephrosis is noted without definite ureteral dilatation. Potentially this may related to ureteropelvic junction obstruction or stenosis. No renal or ureteral calculi are noted. Small nonobstructive right renal calculus is noted. Probable 6.3 cm left adnexal hyperdense or complex cyst. Pelvic ultrasound is recommended for further evaluation. Moderate anasarca. Minimal ascites. Aortic Atherosclerosis (ICD10-I70.0). Electronically Signed   By: Marijo Conception M.D.   On: 03/08/2021 10:05   Telemetry  Telemetry Personally reviewed  Sinus with some bradycardia  ECG    No new tracing as of 03/09/21- Personally Reviewed  Cardiac Studies   Echo 03/05/21: 1. Left ventricular ejection fraction, by estimation, is 45%. The left  ventricle has mildly decreased function. The left ventricle demonstrates  global hypokinesis. There is severe left ventricular hypertrophy. Left  ventricular diastolic parameters are  consistent with Grade I diastolic dysfunction (impaired relaxation). Would  consider cardiac amyloidosis.  2. Right ventricular systolic function is normal. The right ventricular  size is normal. There is mildly elevated pulmonary artery systolic  pressure. The estimated right ventricular systolic pressure is 123456 mmHg.  3. Left atrial size was severely dilated.  4. The mitral valve is normal in structure. Mild to moderate mitral valve  regurgitation. No evidence of mitral stenosis.  5. The aortic valve is tricuspid. Aortic valve regurgitation is trivial.  No aortic stenosis is present.  6. Aortic dilatation noted. There is mild dilatation of the aortic root,  measuring 37 mm.  7. The inferior vena cava is dilated in size with <50% respiratory  variability,  suggesting right atrial pressure of 15 mmHg.  8. There is a left pleural effusion and trivial pericardial effusion.     Assessment & Plan    Acute on chronic combined CHF:  Renal insufficiency  Anemia Fe Def   Kappa light chains ? Amyloid -- heme consult.eval underway   Sinus bradycardia  Volume status is much improved.  Renal function is also better.  We will change her diuretic to 40 twice daily.  Ongoing evaluation for amyloid.  We will have to keep our eye on bradycardia, may require down titration of her carvedilol.  For now we will hold off on other guideline directed therapy until we can make more presumptive diagnosis.  Blood pressure has been somewhat variable    Virl Axe, MD

## 2021-03-10 NOTE — Consult Note (Signed)
Chief Complaint: Concern for cardiac amyloidosis. Request is for bone marrow biopsy.  Referring Physician(s): Dr. Pearletha Alfred  Supervising Physician: Ruthann Cancer  Patient Status: [place] - In-pt  History of Present Illness: Kara Mullins is a 70 y.o. female  inpatient. History of HTN, Lymphedema. Presented to the ED at [facility] with persistent and worsening SHOB, cough, peripheral edema, nausea and vomiting. Found to have AKI, hydronephrosis , acute on chronic CHF. Echocardiogram performed on 5.23.22 with LVEF of 45% and concern for cardiac amyloidosis. Lab work shows elevated urine IgG monoclonal protein with kappa light chain specificity, elevated serum light chains. Team is requesting a bone marrow biopsy for further evaluation for amyloidosis.  Currently without any significant complaints. Patient alert and laying in bed, calm and comfortable. Denies any fevers, headache, chest pain, SOB, cough, abdominal pain, nausea, vomiting or bleeding. Patient states that this is the best she has felt since this admission.   Past Medical History:  Diagnosis Date  . Hypertension     History reviewed. No pertinent surgical history.  Allergies: Patient has no known allergies.  Medications: Prior to Admission medications   Medication Sig Start Date End Date Taking? Authorizing Provider  bisacodyl (FLEET) 10 MG/30ML ENEM Place 10 mg rectally once.   Yes [provider]  escitalopram (LEXAPRO) 10 MG tablet Take 1 tablet by mouth daily. 11/06/20  Yes [provider]  hydrochlorothiazide (HYDRODIURIL) 25 MG tablet Take 1 tablet by mouth daily. 01/08/21  Yes [provider]     Family History  Family history unknown: Yes    Social History   Socioeconomic History  . Marital status: Divorced    Spouse name: Not on file  . Number of children: Not on file  . Years of education: Not on file  . Highest education level: Not on file  Occupational History  . Not on file   Tobacco Use  . Smoking status: Never Smoker  . Smokeless tobacco: Never Used  Vaping Use  . Vaping Use: Never used  Substance and Sexual Activity  . Alcohol use: Not Currently  . Drug use: Never  . Sexual activity: Not on file  Other Topics Concern  . Not on file  Social History Narrative  . Not on file   Social Determinants of Health   Financial Resource Strain: Not on file  Food Insecurity: Not on file  Transportation Needs: Not on file  Physical Activity: Not on file  Stress: Not on file  Social Connections: Not on file     Review of Systems: A 12 point ROS discussed and pertinent positives are indicated in the HPI above.  All other systems are negative.  Review of Systems  Constitutional: Negative for fatigue and fever.  HENT: Negative for congestion.   Respiratory: Negative for cough and shortness of breath.   Gastrointestinal: Negative for abdominal pain, diarrhea, nausea and vomiting.    Vital Signs: BP (!) 179/85 (BP Location: Left Arm)   Pulse (!) 56   Temp 98.1 F (36.7 C) (Oral)   Resp 18   Wt 171 lb 11.8 oz (77.9 kg)   SpO2 94%   Physical Exam Vitals and nursing note reviewed.  Constitutional:      Appearance: She is well-developed.  HENT:     Head: Normocephalic and atraumatic.  Eyes:     Conjunctiva/sclera: Conjunctivae normal.  Cardiovascular:     Rate and Rhythm: Normal rate and regular rhythm.     Heart sounds: Normal heart sounds.  Pulmonary:     Effort: Pulmonary effort is normal.     Breath sounds: Normal breath sounds.  Musculoskeletal:        General: Normal range of motion.     Cervical back: Normal range of motion.  Skin:    General: Skin is warm.  Neurological:     Mental Status: She is alert and oriented to person, place, and time.     Imaging: DG Chest 2 View  Result Date: 03/04/2021 CLINICAL DATA:  Hypertension. EXAM: CHEST - 2 VIEW COMPARISON:  None. FINDINGS: The heart size and mediastinal contours are within normal  limits. Aortic arch calcifications. Streaky bibasilar airspace opacities. Increased interstitial markings. Trace right volume and small to moderate left volume pleural effusions. No pneumothorax. No acute osseous abnormality. IMPRESSION: Pulmonary edema with trace right and small to moderate left volume pleural effusions. Superimposed infection/inflammation not excluded. Followup PA and lateral chest X-ray is recommended in 3-4 weeks following therapy to ensure resolution. Electronically Signed   By: Iven Finn M.D.   On: 03/04/2021 22:40   CT ANGIO CHEST PE W OR WO CONTRAST  Result Date: 03/05/2021 CLINICAL DATA:  Pt presents from home with daughter, c/o n/v and SOB starting this evening. Denies CP, reports some abd discomfort. Last BM today. States she has not had home BP meds today. EXAM: CT ANGIOGRAPHY CHEST WITH CONTRAST TECHNIQUE: Multidetector CT imaging of the chest was performed using the standard protocol during bolus administration of intravenous contrast. Multiplanar CT image reconstructions and MIPs were obtained to evaluate the vascular anatomy. CONTRAST:  183m OMNIPAQUE IOHEXOL 350 MG/ML SOLN COMPARISON:  Chest x-ray 03/04/2021 FINDINGS: Cardiovascular: Satisfactory opacification of the pulmonary arteries to the segmental level. No evidence of pulmonary embolism. Normal heart size. No pericardial effusion. Aorta atherosclerotic plaque. Mediastinum/Nodes: No enlarged mediastinal, hilar, or axillary lymph nodes. Thyroid gland, trachea, and esophagus demonstrate no significant findings. Lungs/Pleura: Interlobular septal wall thickening. Left upper lobe atelectasis. Passive atelectasis bilateral lower lobes. No focal consolidation. Few scattered pulmonary micronodules. No pulmonary mass. Trace to small volume left and small to moderate volume right pleural effusions. No pneumothorax. Upper Abdomen: No acute abnormality. Musculoskeletal: No abdominal wall hernia or abnormality. No suspicious  lytic or blastic osseous lesions. No acute displaced fracture. Multilevel degenerative changes of the spine. Levoscoliosis of the thoracolumbar spine. Review of the MIP images confirms the above findings. IMPRESSION: 1. No pulmonary embolus. 2. Pulmonary edema with trace to small volume left and small to moderate volume right pleural effusions. 3.  Aortic Atherosclerosis (ICD10-I70.0). Electronically Signed   By: MIven FinnM.D.   On: 03/05/2021 05:36   [place] (TRANSABDOMINAL ONLY)  Result Date: 03/08/2021 CLINICAL DATA:  Adnexal cyst. EXAM: TRANSABDOMINAL ULTRASOUND OF PELVIS TECHNIQUE: Transabdominal ultrasound examination of the pelvis was performed including evaluation of the uterus, ovaries, adnexal regions, and pelvic cul-de-sac. COMPARISON:  CT 03/08/2021. FINDINGS: Uterus Measurements: 8.7 x 4.8 x 5.9 cm = volume: 128.3 mL. No fibroids or other mass visualized. Endometrium Thickness: 5.6.  No focal abnormality visualized. Right ovary Measurements: Not visualized Left ovary Measurements: 6.3 x 5.9 x 5.5 cm = volume: 106.7 mL. 4.5 x 6.0 x 6.6 cm simple cyst left ovary Other findings:  Small amount of free pelvic fluid. IMPRESSION: 6.6 cm simple cyst left ovary. This is almost certainly benign, but follow up ultrasound is recommended in 1 year according to the Society of Radiologists [place] (D LClovis Rileyet al. Management of Asymptomatic Ovarian and Other Adnexal  Cysts Imaged at [place]: Society of Radiologists in [place] Statement 2010. Radiology 256 (Sept 2010): 010-071.). Right ovary not visualized. Small amount of free pelvic fluid. Electronically Signed   By: Marcello Moores  Register   On: 03/08/2021 15:57   US RENAL  Result Date: 03/07/2021 CLINICAL DATA:  53 year old with acute kidney injury. EXAM: RENAL / URINARY TRACT ULTRASOUND COMPLETE COMPARISON:  Chest CT 03/05/2021 FINDINGS: Right Kidney: Renal measurements: 12.7 x 4.0 x 6.5 cm =  volume: 168 mL. Normal echogenicity. Mild hydronephrosis. No suspicious renal lesion. Left Kidney: Renal measurements: 13.6 x 6.4 x 6.4 = volume: 290 mL. Normal echogenicity. Moderate left hydronephrosis. No suspicious renal lesion. Anechoic cyst in the mid left kidney that measures up to 5.3 cm. Bladder: Fluid in the urinary bladder.   Ureter jets are not identified. Other: Bilateral pleural effusions. IMPRESSION: 1. Bilateral hydronephrosis. Moderate left hydronephrosis and mild right hydronephrosis. 2. Left renal cyst. 3. Bilateral pleural effusions. These results will be called to the ordering clinician or representative by the Radiologist Assistant, and communication documented in the PACS or [employer]. Electronically Signed   By: Markus Daft M.D.   On: 03/07/2021 16:37   DG Abd 2 Views  Result Date: 03/05/2021 CLINICAL DATA:  Nausea vomiting, mid abdominal discomfort since yesterday EXAM: ABDOMEN - 2 VIEW COMPARISON:  None. FINDINGS: The bowel gas pattern is normal. There is no evidence of free air. No radio-opaque calculi or other significant radiographic abnormality is seen. IMPRESSION: No evidence of bowel obstruction. Electronically Signed   By: Maurine Simmering   On: 03/05/2021 09:52   ECHOCARDIOGRAM COMPLETE  Result Date: 03/05/2021    ECHOCARDIOGRAM REPORT   Patient Name:   AFSHEEN ANTONY Date of Exam: 03/05/2021 Medical Rec #:  [id number]      Height:       60.0 in Accession #:    [id number]     Weight:       160.0 lb Date of Birth:  16-Nov-1950     BSA:          1.698 m Patient Age:    16 years       BP:           182/85 mmHg Patient Gender: F              HR:           73 bpm. Exam Location:  Inpatient Procedure: 2D Echo, Cardiac Doppler and Color Doppler Indications:    I50.40* Unspecified combined systolic (congestive) and diastolic                 (congestive) heart failure  History:        Patient has no prior history of Echocardiogram examinations.                 Signs/Symptoms:Chest  Pain. Elevated troponin. Elevated d dimer.  Sonographer:    Roseanna Rainbow RDCS Referring Phys: [id number] Conception  1. Left ventricular ejection fraction, by estimation, is 45%. The left ventricle has mildly decreased function. The left ventricle demonstrates global hypokinesis. There is severe left ventricular hypertrophy. Left ventricular diastolic parameters are consistent with Grade I diastolic dysfunction (impaired relaxation). Would consider cardiac amyloidosis.  2. Right ventricular systolic function is normal. The right ventricular size is normal. There is mildly elevated pulmonary artery systolic pressure. The estimated right ventricular systolic pressure is 80.8 mmHg.  3. Left atrial size was severely dilated.  4. The mitral valve  is normal in structure. Mild to moderate mitral valve regurgitation. No evidence of mitral stenosis.  5. The aortic valve is tricuspid. Aortic valve regurgitation is trivial. No aortic stenosis is present.  6. Aortic dilatation noted. There is mild dilatation of the aortic root, measuring 37 mm.  7. The inferior vena cava is dilated in size with <50% respiratory variability, suggesting right atrial pressure of 15 mmHg.  8. There is a left pleural effusion and trivial pericardial effusion. FINDINGS  Left Ventricle: Left ventricular ejection fraction, by estimation, is 45%. The left ventricle has mildly decreased function. The left ventricle demonstrates global hypokinesis. The left ventricular internal cavity size was normal in size. There is severe left ventricular hypertrophy. Left ventricular diastolic parameters are consistent with Grade I diastolic dysfunction (impaired relaxation). Right Ventricle: The right ventricular size is normal. No increase in right ventricular wall thickness. Right ventricular systolic function is normal. There is mildly elevated pulmonary artery systolic pressure. The tricuspid regurgitant velocity is 2.57  m/s, and with an assumed  right atrial pressure of 15 mmHg, the estimated right ventricular systolic pressure is 40.9 mmHg. Left Atrium: Left atrial size was severely dilated. Right Atrium: Right atrial size was normal in size. Pericardium: There is a left pleural effusion. Trivial pericardial effusion is present. Mitral Valve: The mitral valve is normal in structure. Mild mitral annular calcification. Mild to moderate mitral valve regurgitation. No evidence of mitral valve stenosis. Tricuspid Valve: The tricuspid valve is normal in structure. Tricuspid valve regurgitation is trivial. Aortic Valve: The aortic valve is tricuspid. Aortic valve regurgitation is trivial. No aortic stenosis is present. Pulmonic Valve: The pulmonic valve was normal in structure. Pulmonic valve regurgitation is trivial. Aorta: Aortic dilatation noted. There is mild dilatation of the aortic root, measuring 37 mm. Venous: The inferior vena cava is dilated in size with less than 50% respiratory variability, suggesting right atrial pressure of 15 mmHg. IAS/Shunts: No atrial level shunt detected by color flow Doppler.  LEFT VENTRICLE PLAX 2D LVIDd:         5.02 cm      Diastology LVIDs:         3.73 cm      LV e' medial:    4.38 cm/s LV PW:         1.86 cm      LV E/e' medial:  20.4 LV IVS:        1.53 cm      LV e' lateral:   5.51 cm/s LVOT diam:     1.80 cm      LV E/e' lateral: 16.2 LV SV:         62 LV SV Index:   37 LVOT Area:     2.54 cm  LV Volumes (MOD) LV vol d, MOD A2C: 88.2 ml LV vol d, MOD A4C: 113.0 ml LV vol s, MOD A2C: 46.4 ml LV vol s, MOD A4C: 55.5 ml LV SV MOD A2C:     41.8 ml LV SV MOD A4C:     113.0 ml LV SV MOD BP:      50.2 ml RIGHT VENTRICLE             IVC RV S prime:     14.10 cm/s  IVC diam: 2.58 cm TAPSE (M-mode): 2.3 cm LEFT ATRIUM             Index       RIGHT ATRIUM  Index LA diam:        3.30 cm 1.94 cm/m  RA Area:     13.60 cm LA Vol (A2C):   85.4 ml 50.30 ml/m RA Volume:   28.80 ml  16.96 ml/m LA Vol (A4C):   99.1 ml 58.37  ml/m LA Biplane Vol: 97.9 ml 57.67 ml/m  AORTIC VALVE LVOT Vmax:   110.00 cm/s LVOT Vmean:  80.500 cm/s LVOT VTI:    0.245 m  AORTA Ao Root diam: 3.70 cm Ao Asc diam:  3.50 cm MITRAL VALVE                TRICUSPID VALVE MV Area (PHT): 3.60 cm     TR Peak grad:   26.4 mmHg MV Decel Time: 211 msec     TR Vmax:        257.00 cm/s MR PISA:        0.57 cm MR PISA Radius: 0.30 cm     SHUNTS MV E velocity: 89.40 cm/s   Systemic VTI:  0.24 m MV A velocity: 115.00 cm/s  Systemic Diam: 1.80 cm MV E/A ratio:  0.78 Loralie Champagne MD Electronically signed by Loralie Champagne MD Signature Date/Time: 03/05/2021/8:08:38 PM    Final    CT RENAL STONE STUDY  Result Date: 03/08/2021 CLINICAL DATA:  Hydronephrosis. EXAM: CT ABDOMEN AND PELVIS WITHOUT CONTRAST TECHNIQUE: Multidetector CT imaging of the abdomen and pelvis was performed following the standard protocol without IV contrast. COMPARISON:  Mar 07, 2021. FINDINGS: Lower chest: Large bilateral pleural effusions are noted with adjacent atelectasis of both lower lobes. Hepatobiliary: No focal liver abnormality is seen. No gallstones, gallbladder wall thickening, or biliary dilatation. Pancreas: Unremarkable. No pancreatic ductal dilatation or surrounding inflammatory changes. Spleen: Normal in size without focal abnormality. Adrenals/Urinary Tract: Adrenal glands appear normal. Small nonobstructive right renal calculus is noted. Moderate left hydronephrosis is noted without definite ureteral dilatation. Potentially this may be due to ureteropelvic junction obstruction. Urinary bladder contains contrast from previous CT scan, but is otherwise unremarkable. Stomach/Bowel: The stomach appears normal. There is no evidence of bowel obstruction or inflammation. Vascular/Lymphatic: Aortic atherosclerosis. No enlarged abdominal or pelvic lymph nodes. Reproductive: Uterus is unremarkable. Probable 6.3 cm left adnexal hyperdense or complex cyst is noted. Other: Moderate anasarca is  noted. Moderate size fat containing periumbilical hernia is noted. Minimal ascites may be present in the pelvis. Musculoskeletal: No acute or significant osseous findings. IMPRESSION: Large bilateral pleural effusions are noted with adjacent atelectasis of both lower lobes. Moderate left hydronephrosis is noted without definite ureteral dilatation. Potentially this may related to ureteropelvic junction obstruction or stenosis. No renal or ureteral calculi are noted. Small nonobstructive right renal calculus is noted. Probable 6.3 cm left adnexal hyperdense or complex cyst. Pelvic ultrasound is recommended for further evaluation. Moderate anasarca. Minimal ascites. Aortic Atherosclerosis (ICD10-I70.0). Electronically Signed   By: Marijo Conception M.D.   On: 03/08/2021 10:05   VAS [place] LOWER EXTREMITY VENOUS (DVT)  Result Date: 03/05/2021  Lower Venous DVT Study Patient Name:  LIEL RUDDEN  Date of Exam:   03/05/2021 Medical Rec #: [id number]       Accession #:    [id number] Date of Birth: [birth year]      Patient Gender: F Patient Age:   69Y Exam Location:  [facility] Procedure:      VAS [place] LOWER EXTREMITY VENOUS (DVT) Referring Phys: [id number] [place] --------------------------------------------------------------------------------  Indications: Edema.  Comparison Study: No previous exams. Performing Technologist: Jeral Fruit  Hill  Examination Guidelines: A complete evaluation includes B-mode imaging, spectral Doppler, color Doppler, and power Doppler as needed of all accessible portions of each vessel. Bilateral testing is considered an integral part of a complete examination. Limited examinations for reoccurring indications may be performed as noted. The reflux portion of the exam is performed with the patient in reverse Trendelenburg.  +---------+---------------+---------+-----------+----------+--------------+ RIGHT    CompressibilityPhasicitySpontaneityPropertiesThrombus Aging  +---------+---------------+---------+-----------+----------+--------------+ CFV      Full           Yes      Yes                                 +---------+---------------+---------+-----------+----------+--------------+ SFJ      Full                                                        +---------+---------------+---------+-----------+----------+--------------+ FV Prox  Full           Yes      Yes                                 +---------+---------------+---------+-----------+----------+--------------+ FV Mid   Full           Yes      Yes                                 +---------+---------------+---------+-----------+----------+--------------+ FV DistalFull           Yes      Yes                                 +---------+---------------+---------+-----------+----------+--------------+ PFV      Full                                                        +---------+---------------+---------+-----------+----------+--------------+ POP      Full           Yes      Yes                                 +---------+---------------+---------+-----------+----------+--------------+ PTV      Full                                                        +---------+---------------+---------+-----------+----------+--------------+ PERO     Full                                                        +---------+---------------+---------+-----------+----------+--------------+   +---------+---------------+---------+-----------+----------+--------------+  LEFT     CompressibilityPhasicitySpontaneityPropertiesThrombus Aging +---------+---------------+---------+-----------+----------+--------------+ CFV      Full           Yes      Yes                                 +---------+---------------+---------+-----------+----------+--------------+ SFJ      Full                                                         +---------+---------------+---------+-----------+----------+--------------+ FV Prox  Full           Yes      Yes                                 +---------+---------------+---------+-----------+----------+--------------+ FV Mid   Full           Yes      Yes                                 +---------+---------------+---------+-----------+----------+--------------+ FV DistalFull           Yes      Yes                                 +---------+---------------+---------+-----------+----------+--------------+ PFV      Full                                                        +---------+---------------+---------+-----------+----------+--------------+ POP      Full           Yes      Yes                                 +---------+---------------+---------+-----------+----------+--------------+ PTV      Full                                                        +---------+---------------+---------+-----------+----------+--------------+ PERO     Full                                                        +---------+---------------+---------+-----------+----------+--------------+     Summary: BILATERAL: - No evidence of deep vein thrombosis seen in the lower extremities, bilaterally. - No evidence of superficial venous thrombosis in the lower extremities, bilaterally. -No evidence of popliteal cyst, bilaterally. -Significant subcutaneous edema found throughout lower extremities.  *See table(s) above for measurements and observations. Electronically signed by Monica Martinez MD on 03/05/2021 at 3:29:23 PM.  Final     Labs:  CBC: Recent Labs    03/04/21 2144 03/05/21 0842 03/06/21 0445 03/10/21 0413  WBC 5.5 5.3 6.1 5.0  HGB 10.1* 9.5* 8.8* 8.5*  HCT 32.9* 29.7* 28.3* 26.7*  PLT 265 234 234 260    COAGS: No results for input(s): INR, APTT in the last 8760 hours.  BMP: Recent Labs    03/07/21 0441 03/08/21 0431 03/09/21 0501 03/10/21 0413  NA 141  139 136 140  K 3.6 3.5 3.0* 3.7  CL 106 104 102 102  CO2 28 28 26  34*  GLUCOSE 99 91 74 78  BUN 33* 35* 35* 30*  CALCIUM 8.2* 8.1* 8.1* 8.2*  CREATININE 1.92* 1.98* 1.82* 1.56*  GFRNONAA 28* 27* 30* 36*    LIVER FUNCTION TESTS: Recent Labs    03/04/21 2144 03/05/21 0842 03/06/21 0445 03/10/21 0413  BILITOT 0.7 0.5 0.7 0.6  AST 29 28 24 15   ALT 17 14 14 13   ALKPHOS 107 91 82 75  PROT 7.4 6.1* 5.8* 5.6*  ALBUMIN 3.1* 2.6* 2.5* 2.1*     Assessment and Plan:  70 y.o. female inpatient. History of HTN, Lymphedema. Presented to the ED at [facility] with persistent and worsening SHOB, cough, peripheral edema, nausea and vomiting. Found to have AKI, hydronephrosis , acute on chronic CHF. Echocardiogram performed on 5.23.22 with LVEF of 45% and concern for cardiac amyloidosis. Lab work shows elevated urine IgG monoclonal protein with kappa light chain specificity, elevated serum light chains. Team is requesting a bone marrow biopsy for further evaluation for amyloidosis. BUN 35, Cr 1.82, Potassium 3.0. Patient is in subcutaneous dose of lovenox, NKDA.  IR consulted for possible bone marrow biopsy. Case has been reviewed and procedure approved by Dr. Serafina Royals.  Patient tentatively scheduled for 6.1.22.  Team instructed to: Keep Patient to be NPO after midnight  Team made aware that should patient be hemo dynamically stable for discharge prior to scheduled procedure date the procedure can be performed as outpatient. IR will call patient when ready.  Risks and benefits of bone marrow biopsy was discussed with the patient and/or patient's family including, but not limited to bleeding, infection, damage to adjacent structures or low yield requiring additional tests.  All of the questions were answered and there is agreement to proceed.  Consent signed and in chart.   Thank you for this interesting consult.  I greatly enjoyed meeting Janetta Vandoren and look forward to participating in their care.   A copy of this report was sent to the requesting provider on this date.  Electronically Signed: Jacqualine Mau, NP 03/10/2021, 2:22 PM   I spent a total of 40 Minutes    in face to face in clinical consultation, greater than 50% of which was counseling/coordinating care for bone marrow biopsy

## 2021-03-11 DIAGNOSIS — N179 Acute kidney failure, unspecified: Secondary | ICD-10-CM | POA: Diagnosis not present

## 2021-03-11 DIAGNOSIS — I5031 Acute diastolic (congestive) heart failure: Secondary | ICD-10-CM | POA: Diagnosis not present

## 2021-03-11 LAB — BASIC METABOLIC PANEL
Anion gap: 4 — ABNORMAL LOW (ref 5–15)
BUN: 31 mg/dL — ABNORMAL HIGH (ref 8–23)
CO2: 34 mmol/L — ABNORMAL HIGH (ref 22–32)
Calcium: 7.9 mg/dL — ABNORMAL LOW (ref 8.9–10.3)
Chloride: 103 mmol/L (ref 98–111)
Creatinine, Ser: 1.75 mg/dL — ABNORMAL HIGH (ref 0.44–1.00)
GFR, Estimated: 31 mL/min — ABNORMAL LOW (ref >60)
Glucose, Bld: 86 mg/dL (ref 70–99)
Potassium: 3.3 mmol/L — ABNORMAL LOW (ref 3.5–5.1)
Sodium: 141 mmol/L (ref 135–145)

## 2021-03-11 MED ORDER — POTASSIUM CHLORIDE CRYS ER 20 MEQ PO TBCR
40.0000 meq | EXTENDED_RELEASE_TABLET | Freq: Once | ORAL | Status: DC
  Filled 2021-03-11 (×2): qty 2

## 2021-03-11 MED ORDER — SPIRONOLACTONE 12.5 MG HALF TABLET
12.5000 mg | ORAL_TABLET | Freq: Every day | ORAL | Status: DC
  Administered 2021-03-11 – 2021-03-15 (×5): 12.5 mg via ORAL
  Filled 2021-03-11 (×5): qty 1

## 2021-03-11 MED ORDER — POTASSIUM CHLORIDE 20 MEQ PO PACK
40.0000 meq | PACK | Freq: Once | ORAL | Status: AC
  Administered 2021-03-11: 40 meq via ORAL
  Filled 2021-03-11: qty 2

## 2021-03-11 MED ORDER — FUROSEMIDE 40 MG PO TABS
40.0000 mg | ORAL_TABLET | Freq: Every day | ORAL | Status: DC
  Administered 2021-03-11 – 2021-03-15 (×5): 40 mg via ORAL
  Filled 2021-03-11 (×5): qty 1

## 2021-03-11 NOTE — Progress Notes (Signed)
PROGRESS NOTE    Kara Mullins  VEH:[id number] DOB: Sep 02, 1951 DOA: 03/04/2021 PCP: Forrest Moron, MD   Brief Narrative:  70 year old female with past medical history of hypertension, lymphedema, anxiety disorder who presented to the ED with multiple complaints including shortness of breath, cough, peripheral edema, nausea and vomiting. Patient also has been experiencing several month history of increasing bilateral lower extremity edema. Over the past 24 hours she developed nausea and frequent bouts of vomiting.  Patient was admitted with acute combined CHF exacerbation, intractable nausea and vomiting.  Cardiology consulted.  SBO ruled out.  Assessment & Plan:   Principal Problem:   Acute CHF (congestive heart failure) (HCC) Active Problems:   Hypertensive urgency   Nausea and vomiting   Elevated troponin level not due myocardial infarction   Elevated d-dimer   Generalized anxiety disorder   Acute congestive heart failure (HCC)   CHF exacerbation (HCC)   Essential hypertension   AKI (acute kidney injury) ([place])   New/acute combined systolic and diastolic congestive heart failure: -echocardiogram showed global hypokinesis of left ventricle, EF 83%, grade 1 diastolic dysfunction.  Consider cardiac amyloidosis. Cardiology on board.  They suspect cardiac amyloidosis and they have initiated the work-up.  Patient to go to [facility] to get cardiac MRI but that is being canceled every day due to GFR being less than 30.  Lasix at 80 mg IV twice daily was resumed by nephrology.  Patient has had robust urine output in following 48 hours, Lasix was reduced to 40 mg IV twice daily and now reduce further to 40 mg oral daily by cardiology.   Acute kidney injury: 1.29> 1.35> 1.59> 1.92> 1.98> 1.8> 1.56> 1.75.  Some decline in creatinine again today.  Lasix reduced to p.o. once daily.  Hypokalemia: We will replace.  Left adnexal complex cyst: Ultrasound confirmed benign cyst.  Nausea  vomiting: Symptoms resolved.  Abdominal x-ray negative for SBO.  Essential hypertension: Blood pressure fairly controlled.  Continue Coreg.  Elevated D-dimer: Ruled out of PE and DVT.  Sinus bradycardia: Cardiology monitoring.  Possible AL amyloidosis: Oncology consulted.  She is scheduled to have bone marrow biopsy on 03/14/2021.  Work-up and management defer to oncology.  DVT prophylaxis: enoxaparin (LOVENOX) injection 40 mg Start: 03/05/21 1000   Code Status: Full Code  Family Communication: None present at bedside.  Plan of care discussed with patient.  Status is: Inpatient  Remains inpatient appropriate because:Inpatient level of care appropriate due to severity of illness   Dispo: The patient is from: Home              Anticipated d/c is to: Home              Patient currently is not medically stable to d/c.   Difficult to place patient No        There is no height or weight on file to calculate BMI.      Nutritional status:               Consultants:   Cardiology  Procedures:     Antimicrobials:  Anti-infectives (From admission, onward)   None         Subjective: Seen and examined.  She is eating breakfast.  She has no complaints. Objective: Vitals:   03/10/21 1220 03/10/21 2135 03/11/21 0430 03/11/21 0600  BP: (!) 179/85 128/69 (!) 142/68   Pulse: (!) 56 (!) 56 (!) 51   Resp: _0 Temp: 98.1 F (36.7  C) 98.5 F (36.9 C) 98.6 F (37 C)   TempSrc: Oral Oral Oral   SpO2: 94% 96% 97%   Weight:    75.9 kg    Intake/Output Summary (Last 24 hours) at 03/11/2021 1102 Last data filed at 03/10/2021 2300 Gross per 24 hour  Intake --  Output 1400 ml  Net -1400 ml   Filed Weights   03/09/21 0500 03/10/21 0500 03/11/21 0600  Weight: 80.4 kg 77.9 kg 75.9 kg    Examination:  General exam: Appears calm and comfortable  Respiratory system: Clear to auscultation. Respiratory effort normal. Cardiovascular system: S1 & S2 heard,  RRR. No JVD, murmurs, rubs, gallops or clicks.  +1 pitting edema bilateral lower extremity Gastrointestinal system: Abdomen is nondistended, soft and nontender. No organomegaly or masses felt. Normal bowel sounds heard. Central nervous system: Alert and oriented. No focal neurological deficits. Extremities: Symmetric 5 x 5 power. Skin: No rashes, lesions or ulcers.  Psychiatry: Judgement and insight appear normal. Mood & affect appropriate.    Data Reviewed: I have personally reviewed following labs and imaging studies  CBC: Recent Labs  Lab 03/04/21 2144 03/05/21 0842 03/06/21 0445 03/10/21 0413  WBC 5.5 5.3 6.1 5.0  NEUTROABS 4.7 4.2  --  3.2  HGB 10.1* 9.5* 8.8* 8.5*  HCT 32.9* 29.7* 28.3* 26.7*  MCV 85.7 84.6 85.5 84.2  PLT 265 234 234 102   Basic Metabolic Panel: Recent Labs  Lab 03/05/21 0842 03/06/21 0445 03/07/21 0441 03/08/21 0431 03/09/21 0501 03/10/21 0413 03/11/21 0442  NA 138   < > 141 139 136 140 141  K 4.0   < > 3.6 3.5 3.0* 3.7 3.3*  CL 107   < > 106 104 102 102 103  CO2 24   < > _0 34* 34*  GLUCOSE 130*   < > 99 91 74 78 86  BUN 27*   < > 33* 35* 35* 30* 31*  CREATININE 1.35*   < > 1.92* 1.98* 1.82* 1.56* 1.75*  CALCIUM 8.3*   < > 8.2* 8.1* 8.1* 8.2* 7.9*  MG 2.3  --   --   --   --   --   --    < > = values in this interval not displayed.   GFR: CrCl cannot be calculated (Unknown ideal weight.). Liver Function Tests: Recent Labs  Lab 03/04/21 2144 03/05/21 0842 03/06/21 0445 03/10/21 0413  AST _1 ALT _2 ALKPHOS 107 91 82 75  BILITOT 0.7 0.5 0.7 0.6  PROT 7.4 6.1* 5.8* 5.6*  ALBUMIN 3.1* 2.6* 2.5* 2.1*   Recent Labs  Lab 03/04/21 2144  LIPASE 22   No results for input(s): AMMONIA in the last 168 hours. Coagulation Profile: No results for input(s): INR, PROTIME in the last 168 hours. Cardiac Enzymes: No results for input(s): CKTOTAL, CKMB, CKMBINDEX, TROPONINI in the last 168 hours. BNP (last 3  results) No results for input(s): PROBNP in the last 8760 hours. HbA1C: No results for input(s): HGBA1C in the last 72 hours. CBG: No results for input(s): GLUCAP in the last 168 hours. Lipid Profile: No results for input(s): CHOL, HDL, LDLCALC, TRIG, CHOLHDL, LDLDIRECT in the last 72 hours. Thyroid Function Tests: No results for input(s): TSH, T4TOTAL, FREET4, T3FREE, THYROIDAB in the last 72 hours. Anemia Panel: Recent Labs    03/08/21 1535 03/10/21 0413  TIBC 162*  --   IRON 14*  --   RETICCTPCT  --  3.0   Sepsis Labs: No results for input(s): PROCALCITON, LATICACIDVEN in the last 168 hours.  Recent Results (from the past 240 hour(s))  Resp Panel by RT-PCR (Flu A&B, Covid) Nasopharyngeal Swab     Status: None   Collection Time: 03/05/21  4:07 AM   Specimen: Nasopharyngeal Swab; Nasopharyngeal(NP) swabs in vial transport medium  Result Value Ref Range Status   SARS Coronavirus 2 by RT PCR NEGATIVE NEGATIVE Final    Comment: (NOTE) SARS-CoV-2 target nucleic acids are NOT DETECTED.  The SARS-CoV-2 RNA is generally detectable in upper respiratory specimens during the acute phase of infection. The lowest concentration of SARS-CoV-2 viral copies this assay can detect is 138 copies/mL. A negative result does not preclude SARS-Cov-2 infection and should not be used as the sole basis for treatment or other patient management decisions. A negative result may occur with  improper specimen collection/handling, submission of specimen other than nasopharyngeal swab, presence of viral mutation(s) within the areas targeted by this assay, and inadequate number of viral copies(<138 copies/mL). A negative result must be combined with clinical observations, patient history, and epidemiological information. The expected result is Negative.  Fact Sheet for Patients:  EntrepreneurPulse.com.au  Fact Sheet for Healthcare Providers:   IncredibleEmployment.be  This test is no t yet approved or cleared by the [place] FDA and  has been authorized for detection and/or diagnosis of SARS-CoV-2 by FDA under an Emergency Use Authorization (EUA). This EUA will remain  in effect (meaning this test can be used) for the duration of the COVID-19 declaration under Section 564(b)(1) of the Act, 21 U.S.C.section 360bbb-3(b)(1), unless the authorization is terminated  or revoked sooner.       Influenza A by PCR NEGATIVE NEGATIVE Final   Influenza B by PCR NEGATIVE NEGATIVE Final    Comment: (NOTE) The Xpert Xpress SARS-CoV-2/FLU/RSV plus assay is intended as an aid in the diagnosis of influenza from Nasopharyngeal swab specimens and should not be used as a sole basis for treatment. Nasal washings and aspirates are unacceptable for Xpert Xpress SARS-CoV-2/FLU/RSV testing.  Fact Sheet for Patients: EntrepreneurPulse.com.au  Fact Sheet for Healthcare Providers: IncredibleEmployment.be  This test is not yet approved or cleared by the [place] FDA and has been authorized for detection and/or diagnosis of SARS-CoV-2 by FDA under an Emergency Use Authorization (EUA). This EUA will remain in effect (meaning this test can be used) for the duration of the COVID-19 declaration under Section 564(b)(1) of the Act, 21 U.S.C. section 360bbb-3(b)(1), unless the authorization is terminated or revoked.  Performed at [facility], [place] [address], [place], [place] [address]       Radiology Studies: No results found.  Scheduled Meds: . amLODipine  5 mg Oral Daily  . carvedilol  25 mg Oral BID WC  . enoxaparin (LOVENOX) injection  40 mg Subcutaneous Q24H  . escitalopram  10 mg Oral Daily  . famotidine  20 mg Oral BID  . furosemide  40 mg Oral Daily  . potassium chloride  40 mEq Oral Once  . potassium chloride  40 mEq Oral Once  . spironolactone   12.5 mg Oral Daily   Continuous Infusions: . promethazine (PHENERGAN) injection (IM or IVPB) 12.5 mg (03/06/21 1306)     LOS: 5 days   Time spent: 28 minutes   Darliss Cheney, MD [facility]  03/11/2021, 11:02 AM   How to contact the [facility] Attending or Consulting provider [place] or covering provider during after hours 7P -  7A, for this patient?  1. Check the care team in [facility] and look for a) attending/consulting [facility] provider listed and b) the [facility] team listed. Page or secure chat [phone]-7P. 2. Log into [email or web address] and use [place]'s universal password to access. If you do not have the password, please contact the hospital operator. 3. Locate the [facility] provider you are looking for under [facility] and page to a number that you can be directly reached. 4. If you still have difficulty reaching the provider, please page the [facility] (Director on Call) for the Hospitalists listed on amion for assistance.

## 2021-03-11 NOTE — Plan of Care (Signed)
  Problem: Education: Goal: Knowledge of General Education information will improve Description: Including pain rating scale, medication(s)/side effects and non-pharmacologic comfort measures Outcome: Progressing   Problem: Health Behavior/Discharge Planning: Goal: Ability to manage health-related needs will improve Outcome: Progressing   Problem: Clinical Measurements: Goal: Will remain free from infection Outcome: Progressing Goal: Cardiovascular complication will be avoided Outcome: Progressing

## 2021-03-11 NOTE — Progress Notes (Addendum)
Progress Note  Patient Name: Kara Mullins Date of Encounter: 03/11/2021  Primary Cardiologist: New   Patient Profile     70 y.o. female with a history of hypertension, lymphedema, and anxietyseen for CHF with modest LV dysfunction EF 45%/severe LVH ( low voltage ECG) ? Amyloid with modest renal insuff IgG spike Kappa >> heme consult >> needs BM+/- rectal Bx, 24 hr urine, colonoscopy for FeDef Anemia,  Hydronephrosis uro following   Subjective   Feelsbetter without dyspnea  Edema better, not great understanding of things it seems   Inpatient Medications    Scheduled Meds: . amLODipine  5 mg Oral Daily  . carvedilol  25 mg Oral BID WC  . enoxaparin (LOVENOX) injection  40 mg Subcutaneous Q24H  . escitalopram  10 mg Oral Daily  . famotidine  20 mg Oral BID  . furosemide  40 mg Intravenous Q12H   Continuous Infusions: . promethazine (PHENERGAN) injection (IM or IVPB) 12.5 mg (03/06/21 1306)   PRN Meds: acetaminophen **OR** acetaminophen, hydrALAZINE, ondansetron **OR** ondansetron (ZOFRAN) IV, polyethylene glycol, promethazine (PHENERGAN) injection (IM or IVPB)   Vital Signs    Vitals:   03/10/21 1220 03/10/21 2135 03/11/21 0430 03/11/21 0600  BP: (!) 179/85 128/69 (!) 142/68   Pulse: (!) 56 (!) 56 (!) 51   Resp: '18 18 16   '$ Temp: 98.1 F (36.7 C) 98.5 F (36.9 C) 98.6 F (37 C)   TempSrc: Oral Oral Oral   SpO2: 94% 96% 97%   Weight:    75.9 kg    Intake/Output Summary (Last 24 hours) at 03/11/2021 0806 Last data filed at 03/10/2021 2300 Gross per 24 hour  Intake --  Output 2200 ml  Net -2200 ml   Filed Weights   03/09/21 0500 03/10/21 0500 03/11/21 0600  Weight: 80.4 kg 77.9 kg 75.9 kg    Physical Exam   Well developed and nourished in no acute distress HENT normal Neck supple with JVP-  8 Decreased BS on L  Regular rate and rhythm, no murmurs or gallops Abd-soft with active BS No Clubbing cyanosis 1+edema Skin-warm and dry A & Oriented   Grossly normal sensory and motor function   Labs    Chemistry Recent Labs  Lab 03/05/21 0842 03/06/21 0445 03/07/21 0441 03/09/21 0501 03/10/21 0413 03/11/21 0442  NA 138 141   < > 136 140 141  K 4.0 3.9   < > 3.0* 3.7 3.3*  CL 107 110   < > 102 102 103  CO2 24 24   < > 26 34* 34*  GLUCOSE 130* 112*   < > 74 78 86  BUN 27* 30*   < > 35* 30* 31*  CREATININE 1.35* 1.59*   < > 1.82* 1.56* 1.75*  CALCIUM 8.3* 8.4*   < > 8.1* 8.2* 7.9*  PROT 6.1* 5.8*  --   --  5.6*  --   ALBUMIN 2.6* 2.5*  --   --  2.1*  --   AST 28 24  --   --  15  --   ALT 14 14  --   --  13  --   ALKPHOS 91 82  --   --  75  --   BILITOT 0.5 0.7  --   --  0.6  --   GFRNONAA 43* 35*   < > 30* 36* 31*  ANIONGAP 7 7   < > 8 4* 4*   < > = values in  this interval not displayed.     Hematology Recent Labs  Lab 03/05/21 0842 03/06/21 0445 03/10/21 0413  WBC 5.3 6.1 5.0  RBC 3.51* 3.31* 3.17*  3.26*  HGB 9.5* 8.8* 8.5*  HCT 29.7* 28.3* 26.7*  MCV 84.6 85.5 84.2  MCH 27.1 26.6 26.8  MCHC 32.0 31.1 31.8  RDW 14.9 14.9 14.3  PLT 234 234 260    Cardiac EnzymesNo results for input(s): TROPONINI in the last 168 hours. No results for input(s): TROPIPOC in the last 168 hours.   BNP Recent Labs  Lab 03/04/21 2258  BNP 1,196.9*     DDimer  Recent Labs  Lab 03/04/21 2312  DDIMER 6.36*     Radiology    No results found. Telemetry    Telemetry Personally reviewed  Sinus with some bradycardia  ECG    No new tracing as of 03/09/21- Personally Reviewed  Cardiac Studies   Echo 03/05/21: 1. Left ventricular ejection fraction, by estimation, is 45%. The left  ventricle has mildly decreased function. The left ventricle demonstrates  global hypokinesis. There is severe left ventricular hypertrophy. Left  ventricular diastolic parameters are  consistent with Grade I diastolic dysfunction (impaired relaxation). Would  consider cardiac amyloidosis.  2. Right ventricular systolic function is  normal. The right ventricular  size is normal. There is mildly elevated pulmonary artery systolic  pressure. The estimated right ventricular systolic pressure is 123456 mmHg.  3. Left atrial size was severely dilated.  4. The mitral valve is normal in structure. Mild to moderate mitral valve  regurgitation. No evidence of mitral stenosis.  5. The aortic valve is tricuspid. Aortic valve regurgitation is trivial.  No aortic stenosis is present.  6. Aortic dilatation noted. There is mild dilatation of the aortic root,  measuring 37 mm.  7. The inferior vena cava is dilated in size with <50% respiratory  variability, suggesting right atrial pressure of 15 mmHg.  8. There is a left pleural effusion and trivial pericardial effusion.     Assessment & Plan    Acute on chronic combined CHF:  Renal insufficiency   GFR est 35  Anemia Fe Def   Pleural effusion L  Needs followup  Kappa light chains ? Amyloid -- heme consult.eval underway   Sinus bradycardia  Volume status  improved  Renal function bumping  Will change her diuretic to oral 40 dialy  With low K will replete and begin aldactone for CHF and Low K   Radiology consult for  BM Bx for amyloid    Sinus brady  Stable will follow  Virl Axe, MD

## 2021-03-12 DIAGNOSIS — I5041 Acute combined systolic (congestive) and diastolic (congestive) heart failure: Secondary | ICD-10-CM | POA: Diagnosis not present

## 2021-03-12 DIAGNOSIS — I5043 Acute on chronic combined systolic (congestive) and diastolic (congestive) heart failure: Secondary | ICD-10-CM | POA: Diagnosis not present

## 2021-03-12 DIAGNOSIS — N179 Acute kidney failure, unspecified: Secondary | ICD-10-CM | POA: Diagnosis not present

## 2021-03-12 LAB — BASIC METABOLIC PANEL
Anion gap: 5 (ref 5–15)
BUN: 31 mg/dL — ABNORMAL HIGH (ref 8–23)
CO2: 32 mmol/L (ref 22–32)
Calcium: 7.8 mg/dL — ABNORMAL LOW (ref 8.9–10.3)
Chloride: 103 mmol/L (ref 98–111)
Creatinine, Ser: 1.73 mg/dL — ABNORMAL HIGH (ref 0.44–1.00)
GFR, Estimated: 32 mL/min — ABNORMAL LOW (ref >60)
Glucose, Bld: 115 mg/dL — ABNORMAL HIGH (ref 70–99)
Potassium: 3.4 mmol/L — ABNORMAL LOW (ref 3.5–5.1)
Sodium: 140 mmol/L (ref 135–145)

## 2021-03-12 LAB — MPO/PR-3 (ANCA) ANTIBODIES
ANCA Proteinase 3: <3.5 U/mL (ref 0.0–3.5)
Myeloperoxidase Abs: <9 U/mL (ref 0.0–9.0)

## 2021-03-12 LAB — MAGNESIUM: Magnesium: 2 mg/dL (ref 1.7–2.4)

## 2021-03-12 MED ORDER — POTASSIUM CHLORIDE 20 MEQ PO PACK
40.0000 meq | PACK | Freq: Once | ORAL | Status: AC
  Administered 2021-03-12: 40 meq via ORAL
  Filled 2021-03-12: qty 2

## 2021-03-12 NOTE — Care Management Important Message (Signed)
Medicare IM given to the patient by Dorla Guizar. 

## 2021-03-12 NOTE — Progress Notes (Signed)
PROGRESS NOTE    Kara Mullins  WCB:[id number] DOB: [birth year] DOA: 03/04/2021 PCP: Forrest Moron, MD   Brief Narrative:  70 year old female with past medical history of hypertension, lymphedema, anxiety disorder who presented to the ED with multiple complaints including shortness of breath, cough, peripheral edema, nausea and vomiting. Patient also has been experiencing several month history of increasing bilateral lower extremity edema. Over the past 24 hours she developed nausea and frequent bouts of vomiting.  Patient was admitted with acute combined CHF exacerbation, intractable nausea and vomiting.  Cardiology consulted.  SBO ruled out.  Assessment & Plan:   Principal Problem:   Acute CHF (congestive heart failure) (HCC) Active Problems:   Hypertensive urgency   Nausea and vomiting   Elevated troponin level not due myocardial infarction   Elevated d-dimer   Generalized anxiety disorder   Acute congestive heart failure (HCC)   CHF exacerbation (HCC)   Essential hypertension   AKI (acute kidney injury) ([place])   New/acute combined systolic and diastolic congestive heart failure: -echocardiogram showed global hypokinesis of left ventricle, EF 61%, grade 1 diastolic dysfunction.  Consider cardiac amyloidosis. Cardiology on board.  They suspect cardiac amyloidosis and they have initiated the work-up.  Patient to go to [facility] to get cardiac MRI but that is being canceled every day due to GFR being less than 30.  Lasix at 80 mg IV twice daily was resumed by nephrology.  Patient has had robust urine output in following 48 hours, Lasix was reduced to 40 mg IV twice daily and then reduce to 40 mg once p.o. on 03/11/2021.  Continue that dose per cardiology.  Patient denies any shortness of breath.  Acute kidney injury: 1.29> 1.35> 1.59> 1.92> 1.98> 1.8> 1.56> 1.75> 1.73.  Has stabilized compared to yesterday.  Will watch for another day.  Hypokalemia: We will replace again  today.  Left adnexal complex cyst: Ultrasound confirmed benign cyst.  Nausea vomiting: Symptoms resolved.  Abdominal x-ray negative for SBO.  Essential hypertension: Blood pressure fairly controlled.  Continue Coreg.  Elevated D-dimer: Ruled out of PE and DVT.  Sinus bradycardia: Cardiology monitoring.  Possible AL amyloidosis: Oncology consulted.  She is scheduled to have bone marrow biopsy on 03/14/2021.  Work-up and management defer to oncology.  DVT prophylaxis: enoxaparin (LOVENOX) injection 40 mg Start: 03/05/21 1000   Code Status: Full Code  Family Communication: None present at bedside.  Plan of care discussed with patient.  Status is: Inpatient  Remains inpatient appropriate because:Inpatient level of care appropriate due to severity of illness   Dispo: The patient is from: Home              Anticipated d/c is to: Home              Patient currently is not medically stable to d/c.   Difficult to place patient No        There is no height or weight on file to calculate BMI.      Nutritional status:               Consultants:   Cardiology  Procedures:     Antimicrobials:  Anti-infectives (From admission, onward)   None         Subjective: Seen and examined.  She has no complaints.  Objective: Vitals:   03/11/21 0900 03/11/21 1422 03/11/21 2206 03/12/21 0500  BP:  (!) 184/85 136/70   Pulse:  (!) 58 (!) 59   Resp: 10 20 16  Temp:  97.7 F (36.5 C) 98 F (36.7 C)   TempSrc:  Oral    SpO2:  98% 98%   Weight:    75.7 kg    Intake/Output Summary (Last 24 hours) at 03/12/2021 1202 Last data filed at 03/12/2021 1030 Gross per 24 hour  Intake 120 ml  Output 950 ml  Net -830 ml   Filed Weights   03/10/21 0500 03/11/21 0600 03/12/21 0500  Weight: 77.9 kg 75.9 kg 75.7 kg    Examination:  General exam: Appears calm and comfortable  Respiratory system: Clear to auscultation. Respiratory effort normal. Cardiovascular system: S1  & S2 heard, RRR. No JVD, murmurs, rubs, gallops or clicks.  +1 pitting edema bilateral lower extremity. Gastrointestinal system: Abdomen is nondistended, soft and nontender. No organomegaly or masses felt. Normal bowel sounds heard. Central nervous system: Alert and oriented. No focal neurological deficits. Extremities: Symmetric 5 x 5 power. Skin: No rashes, lesions or ulcers.  Psychiatry: Judgement and insight appear normal. Mood & affect appropriate.   Data Reviewed: I have personally reviewed following labs and imaging studies  CBC: Recent Labs  Lab 03/06/21 0445 03/10/21 0413  WBC 6.1 5.0  NEUTROABS  --  3.2  HGB 8.8* 8.5*  HCT 28.3* 26.7*  MCV 85.5 84.2  PLT 234 992   Basic Metabolic Panel: Recent Labs  Lab 03/08/21 0431 03/09/21 0501 03/10/21 0413 03/11/21 0442 03/12/21 0511 03/12/21 0914  NA 139 136 140 141 140  --   K 3.5 3.0* 3.7 3.3* 3.4*  --   CL 104 102 102 103 103  --   CO2 28 26 34* 34* 32  --   GLUCOSE 91 74 78 86 115*  --   BUN 35* 35* 30* 31* 31*  --   CREATININE 1.98* 1.82* 1.56* 1.75* 1.73*  --   CALCIUM 8.1* 8.1* 8.2* 7.9* 7.8*  --   MG  --   --   --   --   --  2.0   GFR: CrCl cannot be calculated (Unknown ideal weight.). Liver Function Tests: Recent Labs  Lab 03/06/21 0445 03/10/21 0413  AST 24 15  ALT 14 13  ALKPHOS 82 75  BILITOT 0.7 0.6  PROT 5.8* 5.6*  ALBUMIN 2.5* 2.1*   No results for input(s): LIPASE, AMYLASE in the last 168 hours. No results for input(s): AMMONIA in the last 168 hours. Coagulation Profile: No results for input(s): INR, PROTIME in the last 168 hours. Cardiac Enzymes: No results for input(s): CKTOTAL, CKMB, CKMBINDEX, TROPONINI in the last 168 hours. BNP (last 3 results) No results for input(s): PROBNP in the last 8760 hours. HbA1C: No results for input(s): HGBA1C in the last 72 hours. CBG: No results for input(s): GLUCAP in the last 168 hours. Lipid Profile: No results for input(s): CHOL, HDL, LDLCALC,  TRIG, CHOLHDL, LDLDIRECT in the last 72 hours. Thyroid Function Tests: No results for input(s): TSH, T4TOTAL, FREET4, T3FREE, THYROIDAB in the last 72 hours. Anemia Panel: Recent Labs    03/10/21 0413  RETICCTPCT 3.0   Sepsis Labs: No results for input(s): PROCALCITON, LATICACIDVEN in the last 168 hours.  Recent Results (from the past 240 hour(s))  Resp Panel by RT-PCR (Flu A&B, Covid) Nasopharyngeal Swab     Status: None   Collection Time: 03/05/21  4:07 AM   Specimen: Nasopharyngeal Swab; Nasopharyngeal(NP) swabs in vial transport medium  Result Value Ref Range Status   SARS Coronavirus 2 by RT PCR NEGATIVE NEGATIVE Final  Comment: (NOTE) SARS-CoV-2 target nucleic acids are NOT DETECTED.  The SARS-CoV-2 RNA is generally detectable in upper respiratory specimens during the acute phase of infection. The lowest concentration of SARS-CoV-2 viral copies this assay can detect is 138 copies/mL. A negative result does not preclude SARS-Cov-2 infection and should not be used as the sole basis for treatment or other patient management decisions. A negative result may occur with  improper specimen collection/handling, submission of specimen other than nasopharyngeal swab, presence of viral mutation(s) within the areas targeted by this assay, and inadequate number of viral copies(<138 copies/mL). A negative result must be combined with clinical observations, patient history, and epidemiological information. The expected result is Negative.  Fact Sheet for Patients:  EntrepreneurPulse.com.au  Fact Sheet for Healthcare Providers:  IncredibleEmployment.be  This test is no t yet approved or cleared by the [place] FDA and  has been authorized for detection and/or diagnosis of SARS-CoV-2 by FDA under an Emergency Use Authorization (EUA). This EUA will remain  in effect (meaning this test can be used) for the duration of the COVID-19 declaration  under Section 564(b)(1) of the Act, 21 U.S.C.section 360bbb-3(b)(1), unless the authorization is terminated  or revoked sooner.       Influenza A by PCR NEGATIVE NEGATIVE Final   Influenza B by PCR NEGATIVE NEGATIVE Final    Comment: (NOTE) The Xpert Xpress SARS-CoV-2/FLU/RSV plus assay is intended as an aid in the diagnosis of influenza from Nasopharyngeal swab specimens and should not be used as a sole basis for treatment. Nasal washings and aspirates are unacceptable for Xpert Xpress SARS-CoV-2/FLU/RSV testing.  Fact Sheet for Patients: EntrepreneurPulse.com.au  Fact Sheet for Healthcare Providers: IncredibleEmployment.be  This test is not yet approved or cleared by the [place] FDA and has been authorized for detection and/or diagnosis of SARS-CoV-2 by FDA under an Emergency Use Authorization (EUA). This EUA will remain in effect (meaning this test can be used) for the duration of the COVID-19 declaration under Section 564(b)(1) of the Act, 21 U.S.C. section 360bbb-3(b)(1), unless the authorization is terminated or revoked.  Performed at [facility], [place], [place], [place] [address]       Radiology Studies: No results found.  Scheduled Meds: . amLODipine  5 mg Oral Daily  . carvedilol  25 mg Oral BID WC  . enoxaparin (LOVENOX) injection  40 mg Subcutaneous Q24H  . escitalopram  10 mg Oral Daily  . famotidine  20 mg Oral BID  . furosemide  40 mg Oral Daily  . potassium chloride  40 mEq Oral Once  . spironolactone  12.5 mg Oral Daily   Continuous Infusions: . promethazine (PHENERGAN) injection (IM or IVPB) 12.5 mg (03/06/21 1306)     LOS: 6 days   Time spent: 27 minutes   Darliss Cheney, MD [facility]  03/12/2021, 12:02 PM   How to contact the [facility] Attending or Consulting provider [place] or covering provider during after hours [place], for this patient?  1. Check the care team in  [facility] and look for a) attending/consulting [facility] provider listed and b) the [facility] team listed. Page or secure chat [phone]. 2. Log into [email or web address] and use [place]'s universal password to access. If you do not have the password, please contact the hospital operator. 3. Locate the [facility] provider you are looking for under [facility] and page to a number that you can be directly reached. 4. If you still have difficulty reaching the provider, please  page the [facility] (Director on Call) for the Hospitalists listed on amion for assistance.

## 2021-03-12 NOTE — Progress Notes (Signed)
Progress Note  Patient Name: Kara Mullins Date of Encounter: 03/12/2021  Primary Cardiologist: New   Subjective   I/O's not documented yesterday.  Weight 167 pounds, down 14 pounds from admission.  Creatinine stable, 1.8 > 1.7.  Reports dyspnea has improved.  Inpatient Medications    Scheduled Meds: . amLODipine  5 mg Oral Daily  . carvedilol  25 mg Oral BID WC  . enoxaparin (LOVENOX) injection  40 mg Subcutaneous Q24H  . escitalopram  10 mg Oral Daily  . famotidine  20 mg Oral BID  . furosemide  40 mg Oral Daily  . potassium chloride  40 mEq Oral Once  . potassium chloride  40 mEq Oral Once  . spironolactone  12.5 mg Oral Daily   Continuous Infusions: . promethazine (PHENERGAN) injection (IM or IVPB) 12.5 mg (03/06/21 1306)   PRN Meds: acetaminophen **OR** acetaminophen, hydrALAZINE, ondansetron **OR** ondansetron (ZOFRAN) IV, polyethylene glycol, promethazine (PHENERGAN) injection (IM or IVPB)   Vital Signs    Vitals:   03/11/21 0900 03/11/21 1422 03/11/21 2206 03/12/21 0500  BP:  (!) 184/85 136/70   Pulse:  (!) 58 (!) 59   Resp: _0 Temp:  97.7 F (36.5 C) 98 F (36.7 C)   TempSrc:  Oral    SpO2:  98% 98%   Weight:    75.7 kg   No intake or output data in the 24 hours ending 03/12/21 1014 Filed Weights   03/10/21 0500 03/11/21 0600 03/12/21 0500  Weight: 77.9 kg 75.9 kg 75.7 kg    Physical Exam   General: Elderly, NAD Neck: Negative for carotid bruits. No JVD Lungs: Diminished in bases Cardiovascular: RRR with S1 S2. No murmurs Abdomen: Soft, non-tender, non-distended. No obvious abdominal masses. Extremities: Trace edema. Radial pulses 2+ bilaterally Neuro: Alert and oriented. No focal deficits. No facial asymmetry. MAE spontaneously. Psych: Responds to questions appropriately with normal affect.   Labs    Chemistry Recent Labs  Lab 03/06/21 0445 03/07/21 0441 03/10/21 0413 03/11/21 0442 03/12/21 0511  NA 141   < > 140 141 140   K 3.9   < > 3.7 3.3* 3.4*  CL 110   < > 102 103 103  CO2 24   < > 34* 34* 32  GLUCOSE 112*   < > 78 86 115*  BUN 30*   < > 30* 31* 31*  CREATININE 1.59*   < > 1.56* 1.75* 1.73*  CALCIUM 8.4*   < > 8.2* 7.9* 7.8*  PROT 5.8*  --  5.6*  --   --   ALBUMIN 2.5*  --  2.1*  --   --   AST 24  --  15  --   --   ALT 14  --  13  --   --   ALKPHOS 82  --  75  --   --   BILITOT 0.7  --  0.6  --   --   GFRNONAA 35*   < > 36* 31* 32*  ANIONGAP 7   < > 4* 4* 5   < > = values in this interval not displayed.     Hematology Recent Labs  Lab 03/06/21 0445 03/10/21 0413  WBC 6.1 5.0  RBC 3.31* 3.17*  3.26*  HGB 8.8* 8.5*  HCT 28.3* 26.7*  MCV 85.5 84.2  MCH 26.6 26.8  MCHC 31.1 31.8  RDW 14.9 14.3  PLT 234 260    Cardiac EnzymesNo results  for input(s): TROPONINI in the last 168 hours. No results for input(s): TROPIPOC in the last 168 hours.   BNP No results for input(s): BNP, PROBNP in the last 168 hours.   DDimer  No results for input(s): DDIMER in the last 168 hours.   Radiology    No results found. Telemetry    NSR 50-60s- Personally Reviewed  ECG    No new tracing as of 03/09/21- Personally Reviewed  Cardiac Studies   Echo 03/05/21: 1. Left ventricular ejection fraction, by estimation, is 45%. The left  ventricle has mildly decreased function. The left ventricle demonstrates  global hypokinesis. There is severe left ventricular hypertrophy. Left  ventricular diastolic parameters are  consistent with Grade I diastolic dysfunction (impaired relaxation). Would  consider cardiac amyloidosis.  2. Right ventricular systolic function is normal. The right ventricular  size is normal. There is mildly elevated pulmonary artery systolic  pressure. The estimated right ventricular systolic pressure is 58.2 mmHg.  3. Left atrial size was severely dilated.  4. The mitral valve is normal in structure. Mild to moderate mitral valve  regurgitation. No evidence of mitral stenosis.   5. The aortic valve is tricuspid. Aortic valve regurgitation is trivial.  No aortic stenosis is present.  6. Aortic dilatation noted. There is mild dilatation of the aortic root,  measuring 37 mm.  7. The inferior vena cava is dilated in size with <50% respiratory  variability, suggesting right atrial pressure of 15 mmHg.  8. There is a left pleural effusion and trivial pericardial effusion.   Patient Profile     70 y.o. female with a history of hypertension, lymphedema, and anxiety  Assessment & Plan    1. Acute on chronic combined CHF: Echocardiogram this admission with LVEF at 45% with presentation concerning for cardiac amyloidosis with severe LVH but low voltage on EKG -Amyloid work-up: high kappa/lambda light chain work-up, urine immunofixation shows IgG monoclonal protein with kappa light chain specificity.  Presentation concerning for AL amyloid, hematology consulted, planning bone marrow biopsy on 6/1 -Plan cardiac MRI if stable GFR>30, potentially can do tomorrow but can be done as outpatient if needed -Continue carvedilol 36m BID -Continue spironolactone 12.5 mg daily -Nephrology following given AKI, suspect contrast-induced nephropathy -Diuresed with IV lasix, weight down 14 lbs on admissionnow converted to PO lasix 40 mg daily  2. AKI: -Hydronephrosis on CT>>seen by nephrology who recommended urology consultation. AKI felt to be in the setting of contrast nephropathy. Diuresed with IV lasix 80 mg BID, now converted to PO lasix -Urology with no immediate recs for dilated left kidney/UPJ obstruction however with [place] 4[place]6 weeks.   For questions or updates, please contact   Please consult [email or web address] for contact info under Cardiology/STEMI.   CDonato Heinz MD

## 2021-03-13 ENCOUNTER — Inpatient Hospital Stay (HOSPITAL_COMMUNITY): Payer: Medicare Other

## 2021-03-13 DIAGNOSIS — I5041 Acute combined systolic (congestive) and diastolic (congestive) heart failure: Secondary | ICD-10-CM | POA: Diagnosis not present

## 2021-03-13 LAB — IMMUNOFIXATION ELECTROPHORESIS
IgA: 245 mg/dL (ref 87–352)
IgG (Immunoglobin G), Serum: 1197 mg/dL (ref 586–1602)
IgM (Immunoglobulin M), Srm: 99 mg/dL (ref 26–217)
Total Protein ELP: 5.3 g/dL — ABNORMAL LOW (ref 6.0–8.5)

## 2021-03-13 LAB — BASIC METABOLIC PANEL
Anion gap: 3 — ABNORMAL LOW (ref 5–15)
BUN: 29 mg/dL — ABNORMAL HIGH (ref 8–23)
CO2: 35 mmol/L — ABNORMAL HIGH (ref 22–32)
Calcium: 7.9 mg/dL — ABNORMAL LOW (ref 8.9–10.3)
Chloride: 103 mmol/L (ref 98–111)
Creatinine, Ser: 1.67 mg/dL — ABNORMAL HIGH (ref 0.44–1.00)
GFR, Estimated: 33 mL/min — ABNORMAL LOW (ref >60)
Glucose, Bld: 106 mg/dL — ABNORMAL HIGH (ref 70–99)
Potassium: 3.5 mmol/L (ref 3.5–5.1)
Sodium: 141 mmol/L (ref 135–145)

## 2021-03-13 LAB — ANTINUCLEAR ANTIBODIES, IFA: ANA Ab, IFA: NEGATIVE

## 2021-03-13 LAB — CBC
HCT: 30.3 % — ABNORMAL LOW (ref 36.0–46.0)
Hemoglobin: 9.4 g/dL — ABNORMAL LOW (ref 12.0–15.0)
MCH: 26.9 pg (ref 26.0–34.0)
MCHC: 31 g/dL (ref 30.0–36.0)
MCV: 86.6 fL (ref 80.0–100.0)
Platelets: 286 10*3/uL (ref 150–400)
RBC: 3.5 MIL/uL — ABNORMAL LOW (ref 3.87–5.11)
RDW: 14.2 % (ref 11.5–15.5)
WBC: 4.5 10*3/uL (ref 4.0–10.5)
nRBC: 0 % (ref 0.0–0.2)

## 2021-03-13 LAB — ERYTHROPOIETIN: Erythropoietin: 19.1 m[IU]/mL — ABNORMAL HIGH (ref 2.6–18.5)

## 2021-03-13 LAB — PROTEIN ELECTROPHORESIS, SERUM
A/G Ratio: 0.6 — ABNORMAL LOW (ref 0.7–1.7)
Albumin ELP: 2 g/dL — ABNORMAL LOW (ref 2.9–4.4)
Alpha-1-Globulin: 0.4 g/dL (ref 0.0–0.4)
Alpha-2-Globulin: 0.8 g/dL (ref 0.4–1.0)
Beta Globulin: 0.8 g/dL (ref 0.7–1.3)
Gamma Globulin: 1.2 g/dL (ref 0.4–1.8)
Globulin, Total: 3.2 g/dL (ref 2.2–3.9)
M-Spike, %: 0.2 g/dL — ABNORMAL HIGH
Total Protein ELP: 5.2 g/dL — ABNORMAL LOW (ref 6.0–8.5)

## 2021-03-13 LAB — MISC LABCORP TEST (SEND OUT): Labcorp test code: 550870

## 2021-03-13 LAB — HEPATITIS C VRS RNA DETECT BY PCR-QUAL

## 2021-03-13 MED ORDER — FENTANYL CITRATE (PF) 100 MCG/2ML IJ SOLN
INTRAMUSCULAR | Status: AC
  Filled 2021-03-13: qty 2

## 2021-03-13 MED ORDER — FENTANYL CITRATE (PF) 100 MCG/2ML IJ SOLN
INTRAMUSCULAR | Status: AC | PRN
  Administered 2021-03-13: 25 ug via INTRAVENOUS
  Administered 2021-03-13: 50 ug via INTRAVENOUS

## 2021-03-13 MED ORDER — MIDAZOLAM HCL 2 MG/2ML IJ SOLN
INTRAMUSCULAR | Status: AC
  Filled 2021-03-13: qty 4

## 2021-03-13 MED ORDER — LIDOCAINE HCL (PF) 1 % IJ SOLN
INTRAMUSCULAR | Status: AC | PRN
  Administered 2021-03-13: 10 mL

## 2021-03-13 MED ORDER — MIDAZOLAM HCL 2 MG/2ML IJ SOLN
INTRAMUSCULAR | Status: AC | PRN
  Administered 2021-03-13: 0.5 mg via INTRAVENOUS
  Administered 2021-03-13: 1 mg via INTRAVENOUS

## 2021-03-13 NOTE — Progress Notes (Signed)
PROGRESS NOTE    Kara Mullins  NWG:[id number] DOB: Jun 04, 1951 DOA: 03/04/2021 PCP: Forrest Moron, MD   Brief Narrative:  70 year old female with past medical history of hypertension, lymphedema, anxiety disorder who presented to the ED with multiple complaints including shortness of breath, cough, peripheral edema, nausea and vomiting.  Patient was admitted with acute combined CHF exacerbation, intractable nausea and vomiting.  Cardiology consulted.  SBO ruled out.  Echo suspicion of cardiac amyloidosis.  Cardiology planning to get MRI however he developed acute kidney injury and creatinine continued worse.  Nephrology was consulted per cardiology recommendation.  She continued to be on high dose of IV Lasix which was weaned down to oral Lasix now.  Nephrology recommended urology consultation for left UPJ obstruction without stones.  Per urology, this was chronic and does not need any urgent intervention, follow-up as outpatient in 6 months.  Oncology was consulted.  Patient is scheduled for bone marrow biopsy for definitive diagnosis of amyloidosis.  Assessment & Plan:   Principal Problem:   Acute CHF (congestive heart failure) (HCC) Active Problems:   Hypertensive urgency   Nausea and vomiting   Elevated troponin level not due myocardial infarction   Elevated d-dimer   Generalized anxiety disorder   Acute congestive heart failure (HCC)   CHF exacerbation (HCC)   Essential hypertension   AKI (acute kidney injury) ([place])   New/acute combined systolic and diastolic congestive heart failure: -echocardiogram showed global hypokinesis of left ventricle, EF 57%, grade 1 diastolic dysfunction.  Consider cardiac amyloidosis. Cardiology on board.  They suspect cardiac amyloidosis and they have initiated the work-up. Lasix at 80 mg IV twice daily was resumed by nephrology.  Patient has had robust urine output in following 48 hours, Lasix was reduced to 40 mg IV twice daily and then reduce to 40  mg once p.o. on 03/11/2021.  Continue that dose per cardiology.  Patient denies any shortness of breath.  Patient to go to [facility] today for cardiac MRI.  Acute kidney injury: 1.29> 1.35> 1.59> 1.92> 1.98> 1.8> 1.56> 1.75> 1.73> 1.67.  Some improvement compared to yesterday.  Hypokalemia: Resolved.  Left adnexal complex cyst: Ultrasound confirmed benign cyst.  Nausea vomiting: Symptoms resolved.  Abdominal x-ray negative for SBO.  Essential hypertension: Blood pressure slightly labile but is still fairly controlled.  Continue Coreg.  Elevated D-dimer: Ruled out of PE and DVT.  Sinus bradycardia: Asymptomatic.  Cardiology monitoring.  Possible AL amyloidosis: Oncology consulted.  She is scheduled to have bone marrow biopsy today. work-up and management defer to oncology.  DVT prophylaxis: enoxaparin (LOVENOX) injection 40 mg Start: 03/05/21 1000   Code Status: Full Code  Family Communication: Daughter present at bedside.  Status is: Inpatient  Remains inpatient appropriate because:Inpatient level of care appropriate due to severity of illness   Dispo: The patient is from: Home              Anticipated d/c is to: Home              Patient currently is not medically stable to d/c.   Difficult to place patient No        There is no height or weight on file to calculate BMI.      Nutritional status:               Consultants:   Cardiology  Oncology  Nephrology  Urology  Procedures:     Antimicrobials:  Anti-infectives (From admission, onward)   None  Subjective: Seen and examined.  She has no complaints.  Daughter at the bedside.  Objective: Vitals:   03/12/21 1209 03/12/21 1941 03/13/21 0500 03/13/21 0535  BP: (!) 144/70 123/69  (!) 163/83  Pulse: (!) 52 (!) 43  (!) 55  Resp: 18   18  Temp: 98.4 F (36.9 C) 98.1 F (36.7 C)  98.2 F (36.8 C)  TempSrc: Oral Oral  Oral  SpO2: 98% 98%  97%  Weight:   75.7 kg      Intake/Output Summary (Last 24 hours) at 03/13/2021 1017 Last data filed at 03/12/2021 1800 Gross per 24 hour  Intake 360 ml  Output 1850 ml  Net -1490 ml   Filed Weights   03/11/21 0600 03/12/21 0500 03/13/21 0500  Weight: 75.9 kg 75.7 kg 75.7 kg    Examination:  General exam: Appears calm and comfortable  Respiratory system: Clear to auscultation. Respiratory effort normal. Cardiovascular system: S1 & S2 heard, RRR. No JVD, murmurs, rubs, gallops or clicks.  +1 pitting edema bilateral lower extremity Gastrointestinal system: Abdomen is nondistended, soft and nontender. No organomegaly or masses felt. Normal bowel sounds heard. Central nervous system: Alert and oriented. No focal neurological deficits. Extremities: Symmetric 5 x 5 power. Skin: No rashes, lesions or ulcers.  Psychiatry: Judgement and insight appear normal. Mood & affect appropriate.   Data Reviewed: I have personally reviewed following labs and imaging studies  CBC: Recent Labs  Lab 03/10/21 0413  WBC 5.0  NEUTROABS 3.2  HGB 8.5*  HCT 26.7*  MCV 84.2  PLT 423   Basic Metabolic Panel: Recent Labs  Lab 03/09/21 0501 03/10/21 0413 03/11/21 0442 03/12/21 0511 03/12/21 0914 03/13/21 0413  NA 136 140 141 140  --  141  K 3.0* 3.7 3.3* 3.4*  --  3.5  CL 102 102 103 103  --  103  CO2 26 34* 34* 32  --  35*  GLUCOSE 74 78 86 115*  --  106*  BUN 35* 30* 31* 31*  --  29*  CREATININE 1.82* 1.56* 1.75* 1.73*  --  1.67*  CALCIUM 8.1* 8.2* 7.9* 7.8*  --  7.9*  MG  --   --   --   --  2.0  --    GFR: CrCl cannot be calculated (Unknown ideal weight.). Liver Function Tests: Recent Labs  Lab 03/10/21 0413  AST 15  ALT 13  ALKPHOS 75  BILITOT 0.6  PROT 5.6*  ALBUMIN 2.1*   No results for input(s): LIPASE, AMYLASE in the last 168 hours. No results for input(s): AMMONIA in the last 168 hours. Coagulation Profile: No results for input(s): INR, PROTIME in the last 168 hours. Cardiac Enzymes: No  results for input(s): CKTOTAL, CKMB, CKMBINDEX, TROPONINI in the last 168 hours. BNP (last 3 results) No results for input(s): PROBNP in the last 8760 hours. HbA1C: No results for input(s): HGBA1C in the last 72 hours. CBG: No results for input(s): GLUCAP in the last 168 hours. Lipid Profile: No results for input(s): CHOL, HDL, LDLCALC, TRIG, CHOLHDL, LDLDIRECT in the last 72 hours. Thyroid Function Tests: No results for input(s): TSH, T4TOTAL, FREET4, T3FREE, THYROIDAB in the last 72 hours. Anemia Panel: No results for input(s): VITAMINB12, FOLATE, FERRITIN, TIBC, IRON, RETICCTPCT in the last 72 hours. Sepsis Labs: No results for input(s): PROCALCITON, LATICACIDVEN in the last 168 hours.  Recent Results (from the past 240 hour(s))  Resp Panel by RT-PCR (Flu A&B, Covid) Nasopharyngeal Swab     Status: None  Collection Time: 03/05/21  4:07 AM   Specimen: Nasopharyngeal Swab; Nasopharyngeal(NP) swabs in vial transport medium  Result Value Ref Range Status   SARS Coronavirus 2 by RT PCR NEGATIVE NEGATIVE Final    Comment: (NOTE) SARS-CoV-2 target nucleic acids are NOT DETECTED.  The SARS-CoV-2 RNA is generally detectable in upper respiratory specimens during the acute phase of infection. The lowest concentration of SARS-CoV-2 viral copies this assay can detect is 138 copies/mL. A negative result does not preclude SARS-Cov-2 infection and should not be used as the sole basis for treatment or other patient management decisions. A negative result may occur with  improper specimen collection/handling, submission of specimen other than nasopharyngeal swab, presence of viral mutation(s) within the areas targeted by this assay, and inadequate number of viral copies(<138 copies/mL). A negative result must be combined with clinical observations, patient history, and epidemiological information. The expected result is Negative.  Fact Sheet for Patients:   EntrepreneurPulse.com.au  Fact Sheet for Healthcare Providers:  IncredibleEmployment.be  This test is no t yet approved or cleared by the [place] FDA and  has been authorized for detection and/or diagnosis of SARS-CoV-2 by FDA under an Emergency Use Authorization (EUA). This EUA will remain  in effect (meaning this test can be used) for the duration of the COVID-19 declaration under Section 564(b)(1) of the Act, 21 U.S.C.section 360bbb-3(b)(1), unless the authorization is terminated  or revoked sooner.       Influenza A by PCR NEGATIVE NEGATIVE Final   Influenza B by PCR NEGATIVE NEGATIVE Final    Comment: (NOTE) The Xpert Xpress SARS-CoV-2/FLU/RSV plus assay is intended as an aid in the diagnosis of influenza from Nasopharyngeal swab specimens and should not be used as a sole basis for treatment. Nasal washings and aspirates are unacceptable for Xpert Xpress SARS-CoV-2/FLU/RSV testing.  Fact Sheet for Patients: EntrepreneurPulse.com.au  Fact Sheet for Healthcare Providers: IncredibleEmployment.be  This test is not yet approved or cleared by the [place] FDA and has been authorized for detection and/or diagnosis of SARS-CoV-2 by FDA under an Emergency Use Authorization (EUA). This EUA will remain in effect (meaning this test can be used) for the duration of the COVID-19 declaration under Section 564(b)(1) of the Act, 21 U.S.C. section 360bbb-3(b)(1), unless the authorization is terminated or revoked.  Performed at [facility] [place] [address] [place], [place] [address]       Radiology Studies: No results found.  Scheduled Meds: . amLODipine  5 mg Oral Daily  . carvedilol  25 mg Oral BID WC  . enoxaparin (LOVENOX) injection  40 mg Subcutaneous Q24H  . escitalopram  10 mg Oral Daily  . famotidine  20 mg Oral BID  . furosemide  40 mg Oral Daily  . potassium  chloride  40 mEq Oral Once  . spironolactone  12.5 mg Oral Daily   Continuous Infusions: . promethazine (PHENERGAN) injection (IM or IVPB) 12.5 mg (03/06/21 1306)     LOS: 7 days   Time spent: 28 minutes   Darliss Cheney, MD [facility]  03/13/2021, 10:17 AM   How to contact the [facility] Attending or Consulting provider [place] or covering provider during after hours [place], for this patient?  1. Check the care team in [facility] and look for a) attending/consulting [facility] provider listed and b) the [facility] team listed. Page or secure chat [phone]. 2. Log into [email or web address] and use [place]'s universal password to access. If you do not have the password, please  contact the hospital operator. 3. Locate the [facility] provider you are looking for under [facility] and page to a number that you can be directly reached. 4. If you still have difficulty reaching the provider, please page the [facility] (Director on Call) for the Hospitalists listed on amion for assistance.

## 2021-03-13 NOTE — Progress Notes (Addendum)
Progress Note  Patient Name: Kara Mullins Date of Encounter: 03/13/2021  [facility] Cardiologist: Donato Heinz, MD   Subjective   Pt feels well, awaiting bone marrow biopsy. Bradycardia is asymptomatic.  Inpatient Medications    Scheduled Meds: . amLODipine  5 mg Oral Daily  . carvedilol  25 mg Oral BID WC  . enoxaparin (LOVENOX) injection  40 mg Subcutaneous Q24H  . escitalopram  10 mg Oral Daily  . famotidine  20 mg Oral BID  . furosemide  40 mg Oral Daily  . potassium chloride  40 mEq Oral Once  . spironolactone  12.5 mg Oral Daily   Continuous Infusions: . promethazine (PHENERGAN) injection (IM or IVPB) 12.5 mg (03/06/21 1306)   PRN Meds: acetaminophen **OR** acetaminophen, hydrALAZINE, ondansetron **OR** ondansetron (ZOFRAN) IV, polyethylene glycol, promethazine (PHENERGAN) injection (IM or IVPB)   Vital Signs    Vitals:   03/12/21 1209 03/12/21 1941 03/13/21 0500 03/13/21 0535  BP: (!) 144/70 123/69  (!) 163/83  Pulse: (!) 52 (!) 43  (!) 55  Resp: 18   18  Temp: 98.4 F (36.9 C) 98.1 F (36.7 C)  98.2 F (36.8 C)  TempSrc: Oral Oral  Oral  SpO2: 98% 98%  97%  Weight:   75.7 kg     Intake/Output Summary (Last 24 hours) at 03/13/2021 1043 Last data filed at 03/12/2021 1800 Gross per 24 hour  Intake 360 ml  Output 900 ml  Net -540 ml   Last 3 Weights 03/13/2021 03/12/2021 03/11/2021  Weight (lbs) 166 lb 14.2 oz 166 lb 14.2 oz 167 lb 5.3 oz  Weight (kg) 75.7 kg 75.7 kg 75.9 kg      Telemetry    Sinus to sinus bradycardia HR 40-60s - Personally Reviewed  ECG    No new tracings - Personally Reviewed  Physical Exam   GEN: No acute distress.   Neck: No JVD Cardiac: RRR, no murmurs, rubs, or gallops.  Respiratory: Clear to auscultation bilaterally. GI: Soft, nontender, non-distended  MS: No edema; No deformity. Neuro:  Nonfocal  Psych: Normal affect   Labs    High Sensitivity Troponin:   Recent Labs  Lab 03/04/21 0053  03/04/21 2144  TROPONINIHS 29* 31*      Chemistry Recent Labs  Lab 03/10/21 0413 03/11/21 0442 03/12/21 0511 03/13/21 0413  NA 140 141 140 141  K 3.7 3.3* 3.4* 3.5  CL 102 103 103 103  CO2 34* 34* 32 35*  GLUCOSE 78 86 115* 106*  BUN 30* 31* 31* 29*  CREATININE 1.56* 1.75* 1.73* 1.67*  CALCIUM 8.2* 7.9* 7.8* 7.9*  PROT 5.6*  --   --   --   ALBUMIN 2.1*  --   --   --   AST 15  --   --   --   ALT 13  --   --   --   ALKPHOS 75  --   --   --   BILITOT 0.6  --   --   --   GFRNONAA 36* 31* 32* 33*  ANIONGAP 4* 4* 5 3*     Hematology Recent Labs  Lab 03/10/21 0413  WBC 5.0  RBC 3.17*  3.26*  HGB 8.5*  HCT 26.7*  MCV 84.2  MCH 26.8  MCHC 31.8  RDW 14.3  PLT 260    BNPNo results for input(s): BNP, PROBNP in the last 168 hours.   DDimer No results for input(s): DDIMER in the last 168 hours.  Radiology    No results found.  Cardiac Studies   Echo 03/05/21: 1. Left ventricular ejection fraction, by estimation, is 45%. The left  ventricle has mildly decreased function. The left ventricle demonstrates  global hypokinesis. There is severe left ventricular hypertrophy. Left  ventricular diastolic parameters are  consistent with Grade I diastolic dysfunction (impaired relaxation). Would  consider cardiac amyloidosis.  2. Right ventricular systolic function is normal. The right ventricular  size is normal. There is mildly elevated pulmonary artery systolic  pressure. The estimated right ventricular systolic pressure is 16.1 mmHg.  3. Left atrial size was severely dilated.  4. The mitral valve is normal in structure. Mild to moderate mitral valve  regurgitation. No evidence of mitral stenosis.  5. The aortic valve is tricuspid. Aortic valve regurgitation is trivial.  No aortic stenosis is present.  6. Aortic dilatation noted. There is mild dilatation of the aortic root,  measuring 37 mm.  7. The inferior vena cava is dilated in size with <50% respiratory   variability, suggesting right atrial pressure of 15 mmHg.  8. There is a left pleural effusion and trivial pericardial effusion.   Patient Profile     70 y.o. female with a history of HTN, lymphedema, and anxiety. She presented with HFrEF exacerbation with echo concerning for amyloid.   Assessment & Plan    Acute systolic and diastolic heart failure Mild to moderate MR Hypertension CKD - echo with EF 45%, but suspicious for amyloid - renal function has precluded cardiac MRI - will be done outpatient - GFR today above 30 - will discuss with Dr. Percival Spanish RE timing of cMRI - urine immunofixation showed IgG monoclonal prtein with kappa light chain specificity - concern for AL amyloid - pending bone marrow biopsy - planning for today - IV diuresis converted to PO dosing - 40 mg lasix SID - weight stable at 166 lbs, down from 180 lbs on admission - on 5 mg amlodipine, 25 mg coreg, 12.5 mg spiro - sCr 1.67, K 35, appreciate nephrology - she has an appt with nephrology established, renal function improved slightly, baseline unknown    For questions or updates, please contact [place] Please consult [email or web address] for contact info under        Signed, Ledora Bottcher, PA  03/13/2021, 10:43 AM    History and all data above reviewed.  Patient examined.  I agree with the findings as above.  Breathing much better than previous.  No pain.    The patient exam reveals COR:RRR  ,  Lungs: Decreased breath sounds with basilar crackles.   ,  Abd: Positive bowel sounds, no rebound no guarding, Ext no edema  .  All available labs, radiology testing, previous records reviewed. Agree with documented assessment and plan.   Acute systolic and diastolic HF:   Echo images reviewed and could be amyloid cardiomyopathy.  Bone marrow is pending.  We can plan the MRI as an out patient and consider PYP scanning.  Avoiding ACE/ARB/ARNi.    Jeneen Rinks Taiquan Campanaro  11:05 AM  03/13/2021

## 2021-03-13 NOTE — Plan of Care (Signed)
  Problem: Education: Goal: Knowledge of General Education information will improve Description: Including pain rating scale, medication(s)/side effects and non-pharmacologic comfort measures Outcome: Progressing   Problem: Clinical Measurements: Goal: Will remain free from infection Outcome: Progressing Goal: Cardiovascular complication will be avoided Outcome: Progressing   Problem: Nutrition: Goal: Adequate nutrition will be maintained Outcome: Progressing   Problem: Elimination: Goal: Will not experience complications related to bowel motility Outcome: Progressing Goal: Will not experience complications related to urinary retention Outcome: Progressing

## 2021-03-13 NOTE — Procedures (Signed)
Interventional Radiology Procedure Note  Procedure: CT BM ASP AND CORE    Complications: None  Estimated Blood Loss:  MIN  Findings: 11 G CORE AND ASP    M. TREVOR Lawrencia Mauney, MD    

## 2021-03-14 DIAGNOSIS — I5041 Acute combined systolic (congestive) and diastolic (congestive) heart failure: Secondary | ICD-10-CM | POA: Diagnosis not present

## 2021-03-14 LAB — CBC WITH DIFFERENTIAL/PLATELET
Abs Immature Granulocytes: 0.01 10*3/uL (ref 0.00–0.07)
Basophils Absolute: 0 10*3/uL (ref 0.0–0.1)
Basophils Relative: 1 %
Eosinophils Absolute: 0.1 10*3/uL (ref 0.0–0.5)
Eosinophils Relative: 2 %
HCT: 26.1 % — ABNORMAL LOW (ref 36.0–46.0)
Hemoglobin: 8.1 g/dL — ABNORMAL LOW (ref 12.0–15.0)
Immature Granulocytes: 0 %
Lymphocytes Relative: 33 %
Lymphs Abs: 1.5 10*3/uL (ref 0.7–4.0)
MCH: 27.1 pg (ref 26.0–34.0)
MCHC: 31 g/dL (ref 30.0–36.0)
MCV: 87.3 fL (ref 80.0–100.0)
Monocytes Absolute: 0.5 10*3/uL (ref 0.1–1.0)
Monocytes Relative: 10 %
Neutro Abs: 2.6 10*3/uL (ref 1.7–7.7)
Neutrophils Relative %: 54 %
Platelets: 240 10*3/uL (ref 150–400)
RBC: 2.99 MIL/uL — ABNORMAL LOW (ref 3.87–5.11)
RDW: 14.3 % (ref 11.5–15.5)
WBC: 4.7 10*3/uL (ref 4.0–10.5)
nRBC: 0 % (ref 0.0–0.2)

## 2021-03-14 LAB — BASIC METABOLIC PANEL
Anion gap: 6 (ref 5–15)
BUN: 27 mg/dL — ABNORMAL HIGH (ref 8–23)
CO2: 31 mmol/L (ref 22–32)
Calcium: 7.9 mg/dL — ABNORMAL LOW (ref 8.9–10.3)
Chloride: 104 mmol/L (ref 98–111)
Creatinine, Ser: 1.47 mg/dL — ABNORMAL HIGH (ref 0.44–1.00)
GFR, Estimated: 39 mL/min — ABNORMAL LOW (ref >60)
Glucose, Bld: 95 mg/dL (ref 70–99)
Potassium: 3.7 mmol/L (ref 3.5–5.1)
Sodium: 141 mmol/L (ref 135–145)

## 2021-03-14 MED ORDER — FUROSEMIDE 40 MG PO TABS: 40.0000 mg | ORAL_TABLET | Freq: Every day | ORAL | 0 refills | Status: DC

## 2021-03-14 MED ORDER — SPIRONOLACTONE 25 MG PO TABS: 12.5000 mg | ORAL_TABLET | Freq: Every day | ORAL | 0 refills | Status: DC

## 2021-03-14 MED ORDER — CARVEDILOL 12.5 MG PO TABS
12.5000 mg | ORAL_TABLET | Freq: Two times a day (BID) | ORAL | Status: DC
  Administered 2021-03-15: 12.5 mg via ORAL
  Filled 2021-03-14: qty 1

## 2021-03-14 MED ORDER — CARVEDILOL 12.5 MG PO TABS: 12.5000 mg | ORAL_TABLET | Freq: Two times a day (BID) | ORAL | 0 refills | Status: DC

## 2021-03-14 MED ORDER — CARVEDILOL 12.5 MG PO TABS: 12.5000 mg | ORAL_TABLET | Freq: Two times a day (BID) | ORAL | Status: DC

## 2021-03-14 MED ORDER — AMLODIPINE BESYLATE 10 MG PO TABS
10.0000 mg | ORAL_TABLET | Freq: Every day | ORAL | Status: DC
  Administered 2021-03-15: 10 mg via ORAL
  Filled 2021-03-14 (×2): qty 1

## 2021-03-14 MED ORDER — AMLODIPINE BESYLATE 10 MG PO TABS: 10.0000 mg | ORAL_TABLET | Freq: Every day | ORAL | 0 refills | Status: DC

## 2021-03-14 MED ORDER — CARVEDILOL 25 MG PO TABS: 25.0000 mg | ORAL_TABLET | Freq: Two times a day (BID) | ORAL | 0 refills | Status: DC

## 2021-03-14 MED ORDER — FAMOTIDINE 20 MG PO TABS: 20.0000 mg | ORAL_TABLET | Freq: Every day | ORAL | 0 refills | Status: DC

## 2021-03-14 MED ORDER — SODIUM CHLORIDE 0.9 % IV BOLUS: 500.0000 mL | Freq: Once | INTRAVENOUS | Status: DC

## 2021-03-14 NOTE — Progress Notes (Addendum)
Progress Note  Patient Name: Kara Mullins Date of Encounter: 03/14/2021  [facility] Cardiologist: Donato Heinz, MD   Subjective   Patient states she feels well, denied any chest pain or SOB, states her legs are much less swollen. She is asking when can she go home.   Inpatient Medications    Scheduled Meds: . [START ON 03/15/2021] amLODipine  10 mg Oral Daily  . carvedilol  25 mg Oral BID WC  . enoxaparin (LOVENOX) injection  40 mg Subcutaneous Q24H  . escitalopram  10 mg Oral Daily  . famotidine  20 mg Oral BID  . furosemide  40 mg Oral Daily  . spironolactone  12.5 mg Oral Daily   Continuous Infusions: . promethazine (PHENERGAN) injection (IM or IVPB) 12.5 mg (03/06/21 1306)   PRN Meds: acetaminophen **OR** acetaminophen, hydrALAZINE, ondansetron **OR** ondansetron (ZOFRAN) IV, polyethylene glycol, promethazine (PHENERGAN) injection (IM or IVPB)   Vital Signs    Vitals:   03/13/21 1641 03/13/21 2122 03/14/21 0351 03/14/21 0557  BP: (!) 147/75 130/73  (!) 163/83  Pulse: (!) 53 (!) 54  (!) 58  Resp: _0 Temp: (!) 97.4 F (36.3 C) 98.3 F (36.8 C)  98.5 F (36.9 C)  TempSrc: Oral Oral  Oral  SpO2: 98% 93%  93%  Weight:   72.9 kg     Intake/Output Summary (Last 24 hours) at 03/14/2021 1130 Last data filed at 03/13/2021 1626 Gross per 24 hour  Intake --  Output 900 ml  Net -900 ml   Last 3 Weights 03/14/2021 03/13/2021 03/12/2021  Weight (lbs) 160 lb 11.5 oz 166 lb 14.2 oz 166 lb 14.2 oz  Weight (kg) 72.9 kg 75.7 kg 75.7 kg      Telemetry    Sinus to sinus bradycardia HR 50-60s , PVCs, artifacts - Personally Reviewed  ECG    No new tracings - Personally Reviewed  Physical Exam   GEN: No acute distress.   Neck: No JVD Cardiac: RRR, no murmurs, rubs, or gallops.  Respiratory: Clear to auscultation bilaterally. On room air.  GI: Soft, nontender, non-distended  MS: Chronic BLE lymphedema, No deformity. Neuro:  Nonfocal  Psych: Normal  affect   Labs    High Sensitivity Troponin:   Recent Labs  Lab 03/04/21 0053 03/04/21 2144  TROPONINIHS 29* 31*      Chemistry Recent Labs  Lab 03/10/21 0413 03/11/21 0442 03/12/21 0511 03/13/21 0413 03/14/21 0437  NA 140   < > 140 141 141  K 3.7   < > 3.4* 3.5 3.7  CL 102   < > 103 103 104  CO2 34*   < > 32 35* 31  GLUCOSE 78   < > 115* 106* 95  BUN 30*   < > 31* 29* 27*  CREATININE 1.56*   < > 1.73* 1.67* 1.47*  CALCIUM 8.2*   < > 7.8* 7.9* 7.9*  PROT 5.6*  --   --   --   --   ALBUMIN 2.1*  --   --   --   --   AST 15  --   --   --   --   ALT 13  --   --   --   --   ALKPHOS 75  --   --   --   --   BILITOT 0.6  --   --   --   --   GFRNONAA 36*   < > 32*  33* 39*  ANIONGAP 4*   < > 5 3* 6   < > = values in this interval not displayed.     Hematology Recent Labs  Lab 03/10/21 0413 03/13/21 1635 03/14/21 0437  WBC 5.0 4.5 4.7  RBC 3.17*  3.26* 3.50* 2.99*  HGB 8.5* 9.4* 8.1*  HCT 26.7* 30.3* 26.1*  MCV 84.2 86.6 87.3  MCH 26.8 26.9 27.1  MCHC 31.8 31.0 31.0  RDW 14.3 14.2 14.3  PLT 260 286 240    BNPNo results for input(s): BNP, PROBNP in the last 168 hours.   DDimer No results for input(s): DDIMER in the last 168 hours.   Radiology    CT BIOPSY  Result Date: 03/13/2021 INDICATION: Amyloidosis EXAM: CT GUIDED RIGHT ILIAC BONE MARROW ASPIRATION AND CORE BIOPSY Date:  03/13/2021 03/13/2021 1:00 pm Radiologist:  M. Daryll Brod, MD Guidance:  CT FLUOROSCOPY TIME:  Fluoroscopy Time: None. MEDICATIONS: 1% lidocaine local ANESTHESIA/SEDATION: 1.5 mg IV Versed; 75 mcg IV Fentanyl Moderate Sedation Time:  10 minutes The patient was continuously monitored during the procedure by the interventional radiology nurse under my direct supervision. CONTRAST:  None. COMPLICATIONS: None PROCEDURE: Informed consent was obtained from the patient following explanation of the procedure, risks, benefits and alternatives. The patient understands, agrees and consents for the  procedure. All questions were addressed. A time out was performed. The patient was positioned prone and non-contrast localization CT was performed of the pelvis to demonstrate the iliac marrow spaces. Maximal barrier sterile technique utilized including caps, mask, sterile gowns, sterile gloves, large sterile drape, hand hygiene, and Betadine prep. Under sterile conditions and local anesthesia, an 11 gauge coaxial bone biopsy needle was advanced into the right iliac marrow space. Needle position was confirmed with CT imaging. Initially, bone marrow aspiration was performed. Next, the 11 gauge outer cannula was utilized to obtain a right iliac bone marrow core biopsy. Needle was removed. Hemostasis was obtained with compression. The patient tolerated the procedure well. Samples were prepared with the cytotechnologist. No immediate complications. IMPRESSION: CT guided right iliac bone marrow aspiration and core biopsy. Electronically Signed   By: Jerilynn Mages.  Shick M.D.   On: 03/13/2021 16:08   CT BONE MARROW BIOPSY & ASPIRATION  Result Date: 03/13/2021 INDICATION: Amyloidosis EXAM: CT GUIDED RIGHT ILIAC BONE MARROW ASPIRATION AND CORE BIOPSY Date:  03/13/2021 03/13/2021 1:00 pm Radiologist:  M. Daryll Brod, MD Guidance:  CT FLUOROSCOPY TIME:  Fluoroscopy Time: None. MEDICATIONS: 1% lidocaine local ANESTHESIA/SEDATION: 1.5 mg IV Versed; 75 mcg IV Fentanyl Moderate Sedation Time:  10 minutes The patient was continuously monitored during the procedure by the interventional radiology nurse under my direct supervision. CONTRAST:  None. COMPLICATIONS: None PROCEDURE: Informed consent was obtained from the patient following explanation of the procedure, risks, benefits and alternatives. The patient understands, agrees and consents for the procedure. All questions were addressed. A time out was performed. The patient was positioned prone and non-contrast localization CT was performed of the pelvis to demonstrate the iliac marrow  spaces. Maximal barrier sterile technique utilized including caps, mask, sterile gowns, sterile gloves, large sterile drape, hand hygiene, and Betadine prep. Under sterile conditions and local anesthesia, an 11 gauge coaxial bone biopsy needle was advanced into the right iliac marrow space. Needle position was confirmed with CT imaging. Initially, bone marrow aspiration was performed. Next, the 11 gauge outer cannula was utilized to obtain a right iliac bone marrow core biopsy. Needle was removed. Hemostasis was obtained with compression. The patient tolerated the  procedure well. Samples were prepared with the cytotechnologist. No immediate complications. IMPRESSION: CT guided right iliac bone marrow aspiration and core biopsy. Electronically Signed   By: Jerilynn Mages.  Shick M.D.   On: 03/13/2021 16:08    Cardiac Studies   Echo 03/05/21: 1. Left ventricular ejection fraction, by estimation, is 45%. The left  ventricle has mildly decreased function. The left ventricle demonstrates  global hypokinesis. There is severe left ventricular hypertrophy. Left  ventricular diastolic parameters are  consistent with Grade I diastolic dysfunction (impaired relaxation). Would  consider cardiac amyloidosis.  2. Right ventricular systolic function is normal. The right ventricular  size is normal. There is mildly elevated pulmonary artery systolic  pressure. The estimated right ventricular systolic pressure is 71.2 mmHg.  3. Left atrial size was severely dilated.  4. The mitral valve is normal in structure. Mild to moderate mitral valve  regurgitation. No evidence of mitral stenosis.  5. The aortic valve is tricuspid. Aortic valve regurgitation is trivial.  No aortic stenosis is present.  6. Aortic dilatation noted. There is mild dilatation of the aortic root,  measuring 37 mm.  7. The inferior vena cava is dilated in size with <50% respiratory  variability, suggesting right atrial pressure of 15 mmHg.  8. There  is a left pleural effusion and trivial pericardial effusion.   Patient Profile     70 y.o. female with a history of HTN, lymphedema, and anxiety. She presented with HFrEF exacerbation with echo concerning for amyloid.   Assessment & Plan    Acute systolic and diastolic heart failure Mild to moderate MR Sinus bradycardia, asymptomatic  Suspected cardiac amyloidosis - presented with shortness of breath, cough, peripheral edema, and nausea and vomiting - BNP 1196 POA - CTA chest with pulmonary edema POA - Echo with EF 45%, with suspicion for cardiac amyloidosis, see report for details - will be done outpatient - Amyloid work-up: kappa/lambda light chain high,  SPEP with a single peak (M-spike) in the gamma  region which may represent monoclonal protein, serum and urine  immunofixation shows IgG monoclonal protein with kappa light chain specificity.  Presentation concerning for AL amyloid, hematology consulted, s/p CT guided right iliac bone marrow aspiration and biopsy 03/13/21 - weight 179 >160 pounds, net -13.9L  since admission - s/p IV diuresis, now transitioned to PO lasix 40mg  daily on 03/11/21, appears euvolemic today  - GDMT: continue coreg 25mg  BID, spironolactone 12.5mg  daily, no addition of ARB/ACEI/ARNI due to AKI, may consider switch amlodipine to hydralazine + Imdur  - outpatient follow up appointment with Dr Gardiner Rhyme is scheduled on 03/19/21   AKI on CKD stage III - Cr was 0.61 back in 2021, 1.29 with GFR 45 POA, peaked at 1.98 on 5/26, now downtrend to 1.47 - CT 03/08/21 showed Moderate left hydronephrosis is noted without definite ureteral dilatation. - Nephrology and Urology consulted, felt AKI due to contrast nephropathy and over-diuresis, "CT demonstrates what appears to be a chronic UPJ obstruction of the left kidney without significant loss of parenchyma or evidence that this is clinically significant in any way, recommend renal ultrasound in 4-6 weeks". - renal index is now  recovering on PO lasix and spironolactone, avoid ACEI/ARB/ARNI at this time  Hypertension - BP remains elevated today, will increase amlodipine to 10mg  daily, continue lasix 40mg  daily, Coreg 25mg  BID, and spironolactone 12.5mg  daily,  may consider switch amlodipine to hydralazine + Imdur  - re-evaluate BP in the office   Anemia Left adnexal complex cyst Elevated D-dimer  negative for PE and DVT  - managed per primary team    For questions or updates, please contact [place] Please consult [email or web address] for contact info under     Signed, Margie Billet, NP  03/14/2021, 11:30 AM    History and all data above reviewed.  Patient examined.  I agree with the findings as above.   She says that she is breathing much better than previous.  No chest pain. The patient exam reveals COR:RRR  ,  Lungs:   Few basilar crackles   ,  Abd: Positive bowel sounds, no rebound no guarding, Ext Mild edema  .  All available labs, radiology testing, previous records reviewed. Agree with documented assessment and plan.   Acute systolic and diastolic HF.  Meds as listed.  Work up for infiltrative CM can be completed as an out patient.  Meds as on MAR.  We will titrate meds further as an outpatient.    Jeneen Rinks Shary Lamos  1:56 PM  03/14/2021

## 2021-03-14 NOTE — Plan of Care (Signed)

## 2021-03-14 NOTE — Progress Notes (Signed)
[facility] Progress Note  Patient: Kara Mullins    XAJ:[id number]  DOA: 03/04/2021     Date of Service: the patient was seen and examined on 03/14/2021  Brief hospital course: 70 year old female with past medical history of hypertension, lymphedema, anxiety disorder who presented to the ED with multiple complaints including shortness of breath, cough, peripheral edema, nausea and vomiting.Patient was admitted with acute combined CHF exacerbation, intractable nausea and vomiting.  Cardiology consulted.  SBO ruled out.  Echo suspicion of cardiac amyloidosis.  Cardiology planning to get MRI however he developed acute kidney injury and creatinine continued worse.  Nephrology was consulted per cardiology recommendation.  She continued to be on high dose of IV Lasix which was weaned down to oral Lasix now.  Nephrology recommended urology consultation for left UPJ obstruction without stones.  Per urology, this was chronic and does not need any urgent intervention, follow-up as outpatient in 6 months.  Oncology was consulted.  Patient is scheduled for bone marrow biopsy for definitive diagnosis of amyloidosis.  Currently plan is monitor overnight for bradycardia.  Assessment and Plan: New/acute combined systolic and diastolic congestive heart failure: -echocardiogram showed global hypokinesis of left ventricle, EF 09%, grade 1 diastolic dysfunction. Consider cardiac amyloidosis. Cardiology on board.  They suspect cardiac amyloidosis and they have initiated the work-up. Lasix at 80 mg IV twice daily was resumed by nephrology.  Patient has had robust urine output in following 48 hours, Lasix was reduced to 40 mg IV twice daily and then reduce to 40 mg once p.o. on 03/11/2021.  Continue that dose per cardiology.  Patient denies any shortness of breath.   Acute kidney injury: 1.29> 1.35> 1.59> 1.92> 1.98> 1.8> 1.56> 1.75> 1.73> 1.67.  Some improvement compared to yesterday.  Hypokalemia:  Resolved.  Left adnexal complex cyst: Ultrasound confirmed benign cyst.  Nausea vomiting: Symptoms resolved.  Abdominal x-ray negative for SBO.  Essential hypertension: Blood pressure slightly labile but is still fairly controlled.  Continue Coreg.  Elevated D-dimer: Ruled out of PE and DVT.  Sinus bradycardia:  Now symptomatic.will hold coreg for one dose and reduce dose going forward. For now due to severe symptoms will monitor overnight  Possible AL amyloidosis: Oncology consulted.  She is scheduled to have bone marrow biopsy today. work-up and management defer to oncology.  Diet: cardiac diet DVT Prophylaxis:   Place TED hose Start: 03/14/21 1504 enoxaparin (LOVENOX) injection 40 mg Start: 03/05/21 1000    Advance goals of care discussion: Full code  Family Communication: family was present at bedside, at the time of interview.  The pt provided permission to discuss medical plan with the family. Opportunity was given to ask question and all questions were answered satisfactorily.   Disposition:  Status is: Inpatient  Remains inpatient appropriate because:Inpatient level of care appropriate due to severity of illness  Dispo: The patient is from: Home              Anticipated d/c is to: Home              Patient currently is not medically stable to d/c.   Difficult to place patient No  Subjective: pt feeling better in AM, later in the day she had some dizziness and lightheadedness. Also reports fatigue. HR drops to 40s.   Physical Exam:  General: Appear in mild distress, no Rash; Oral Mucosa Clear, moist. no Abnormal Neck Mass Or lumps, Conjunctiva normal  Cardiovascular: S1 and S2 Present, no Murmur, Respiratory: good respiratory effort, Bilateral  Air entry present and CTA, no Crackles, no wheezes Abdomen: Bowel Sound present, Soft and no tenderness Extremities: no Pedal edema Neurology: alert and oriented to time, place, and person affect appropriate. no new  focal deficit Gait not checked due to patient safety concerns  Vitals:   03/14/21 0351 03/14/21 0557 03/14/21 1208 03/14/21 1451  BP:  (!) 163/83 105/69 95/60  Pulse:  (!) 58 (!) 55 (!) 46  Resp:  16    Temp:  98.5 F (36.9 C) 97.6 F (36.4 C)   TempSrc:  Oral Oral   SpO2:  93% 100%   Weight: 72.9 kg      No intake or output data in the 24 hours ending 03/14/21 1729 Filed Weights   03/12/21 0500 03/13/21 0500 03/14/21 0351  Weight: 75.7 kg 75.7 kg 72.9 kg    Data Reviewed: I have personally reviewed and interpreted daily labs, tele strips, imaging. I reviewed all nursing notes, pharmacy notes, vitals, pertinent old records I have discussed plan of care as described above with RN and patient/family.  CBC: Recent Labs  Lab 03/10/21 0413 03/13/21 1635 03/14/21 0437  WBC 5.0 4.5 4.7  NEUTROABS 3.2  --  2.6  HGB 8.5* 9.4* 8.1*  HCT 26.7* 30.3* 26.1*  MCV 84.2 86.6 87.3  PLT 260 286 919   Basic Metabolic Panel: Recent Labs  Lab 03/10/21 0413 03/11/21 0442 03/12/21 0511 03/12/21 0914 03/13/21 0413 03/14/21 0437  NA 140 141 140  --  141 141  K 3.7 3.3* 3.4*  --  3.5 3.7  CL 102 103 103  --  103 104  CO2 34* 34* 32  --  35* 31  GLUCOSE 78 86 115*  --  106* 95  BUN 30* 31* 31*  --  29* 27*  CREATININE 1.56* 1.75* 1.73*  --  1.67* 1.47*  CALCIUM 8.2* 7.9* 7.8*  --  7.9* 7.9*  MG  --   --   --  2.0  --   --     Studies: No results found.  Scheduled Meds: . [START ON 03/15/2021] amLODipine  10 mg Oral Daily  . [START ON 03/15/2021] carvedilol  12.5 mg Oral BID WC  . enoxaparin (LOVENOX) injection  40 mg Subcutaneous Q24H  . escitalopram  10 mg Oral Daily  . famotidine  20 mg Oral BID  . furosemide  40 mg Oral Daily  . spironolactone  12.5 mg Oral Daily   Continuous Infusions: . promethazine (PHENERGAN) injection (IM or IVPB) 12.5 mg (03/06/21 1306)   PRN Meds: acetaminophen **OR** acetaminophen, hydrALAZINE, ondansetron **OR** ondansetron (ZOFRAN) IV,  polyethylene glycol, promethazine (PHENERGAN) injection (IM or IVPB)  Time spent: 35 minutes  Author: Berle Mull, MD [facility] 03/14/2021 5:29 PM  To reach On-call, see care teams to locate the attending and reach out via [email or web address]. Between 7PM-7AM, please contact night-coverage If you still have difficulty reaching the attending provider, please page the [facility] (Director on Call) for [facility] on amion for assistance.

## 2021-03-14 NOTE — Progress Notes (Signed)
   03/14/21 1451  Assess: MEWS Score  BP 95/60  Pulse Rate (!) 46  Assess: MEWS Score  MEWS Temp 0  MEWS Systolic 1  MEWS Pulse 1  MEWS RR 0  MEWS LOC 0  MEWS Score 2  MEWS Score Color Yellow  Assess: if the MEWS score is Yellow or Red  Were vital signs taken at a resting state? Yes  Focused Assessment No change from prior assessment  Does the patient meet 2 or more of the SIRS criteria? No  MEWS guidelines implemented *See Row Information* Yes  Treat  MEWS Interventions Escalated (See documentation below)  Take Vital Signs  Increase Vital Sign Frequency  Yellow: Q 2hr X 2 then Q 4hr X 2, if remains yellow, continue Q 4hrs  Escalate  MEWS: Escalate Yellow: discuss with charge nurse/RN and consider discussing with provider and RRT  Notify: Charge Nurse/RN  Name of Charge Nurse/RN Notified Ranae Pila RN  Date Charge Nurse/RN Notified 03/14/21  Time Charge Nurse/RN Notified 1455  Notify: Provider  Provider Name/Title Dr Posey Pronto  Date Provider Notified 03/14/21  Time Provider Notified 1455  Notification Type Page  Notification Reason Other (Comment) (HR 46 and BP 95/60)  Provider response At bedside;See new orders  Date of Provider Response 03/14/21  Time of Provider Response 1500  Document  Patient Outcome Stabilized after interventions  Progress note created (see row info) Yes  Assess: SIRS CRITERIA  SIRS Temperature  0  SIRS Pulse 0  SIRS Respirations  0  SIRS WBC 0  SIRS Score Sum  0

## 2021-03-15 DIAGNOSIS — N189 Chronic kidney disease, unspecified: Secondary | ICD-10-CM | POA: Diagnosis not present

## 2021-03-15 DIAGNOSIS — I959 Hypotension, unspecified: Secondary | ICD-10-CM | POA: Diagnosis not present

## 2021-03-15 DIAGNOSIS — I5041 Acute combined systolic (congestive) and diastolic (congestive) heart failure: Secondary | ICD-10-CM

## 2021-03-15 DIAGNOSIS — D649 Anemia, unspecified: Secondary | ICD-10-CM

## 2021-03-15 LAB — UPEP/UIFE/LIGHT CHAINS/TP, 24-HR UR
% BETA, Urine: 10.7 %
ALPHA 1 URINE: 8.2 %
Albumin, U: 60.6 %
Alpha 2, Urine: 4.9 %
Free Kappa Lt Chains,Ur: 58.91 mg/L (ref 1.17–86.46)
Free Kappa/Lambda Ratio: 8.49 (ref 1.83–14.26)
Free Lambda Lt Chains,Ur: 6.94 mg/L (ref 0.27–15.21)
GAMMA GLOBULIN URINE: 15.6 %
M-SPIKE %, Urine: 1.8 % — ABNORMAL HIGH
M-Spike, Mg/24 Hr: 72 mg/24 hr — ABNORMAL HIGH
Total Protein, Urine-Ur/day: 4001 mg/24 hr — ABNORMAL HIGH (ref 30–150)
Total Protein, Urine: 101.3 mg/dL
Total Volume: 3950

## 2021-03-15 LAB — COMPREHENSIVE METABOLIC PANEL
ALT: 17 U/L (ref 0–44)
AST: 24 U/L (ref 15–41)
Albumin: 2.3 g/dL — ABNORMAL LOW (ref 3.5–5.0)
Alkaline Phosphatase: 73 U/L (ref 38–126)
Anion gap: 5 (ref 5–15)
BUN: 27 mg/dL — ABNORMAL HIGH (ref 8–23)
CO2: 33 mmol/L — ABNORMAL HIGH (ref 22–32)
Calcium: 8.1 mg/dL — ABNORMAL LOW (ref 8.9–10.3)
Chloride: 102 mmol/L (ref 98–111)
Creatinine, Ser: 1.6 mg/dL — ABNORMAL HIGH (ref 0.44–1.00)
GFR, Estimated: 35 mL/min — ABNORMAL LOW (ref >60)
Glucose, Bld: 117 mg/dL — ABNORMAL HIGH (ref 70–99)
Potassium: 3.9 mmol/L (ref 3.5–5.1)
Sodium: 140 mmol/L (ref 135–145)
Total Bilirubin: 0.4 mg/dL (ref 0.3–1.2)
Total Protein: 5.9 g/dL — ABNORMAL LOW (ref 6.5–8.1)

## 2021-03-15 LAB — CBC WITH DIFFERENTIAL/PLATELET
Abs Immature Granulocytes: 0.02 10*3/uL (ref 0.00–0.07)
Basophils Absolute: 0 10*3/uL (ref 0.0–0.1)
Basophils Relative: 0 %
Eosinophils Absolute: 0.1 10*3/uL (ref 0.0–0.5)
Eosinophils Relative: 2 %
HCT: 26.5 % — ABNORMAL LOW (ref 36.0–46.0)
Hemoglobin: 8.2 g/dL — ABNORMAL LOW (ref 12.0–15.0)
Immature Granulocytes: 0 %
Lymphocytes Relative: 25 %
Lymphs Abs: 1.3 10*3/uL (ref 0.7–4.0)
MCH: 27 pg (ref 26.0–34.0)
MCHC: 30.9 g/dL (ref 30.0–36.0)
MCV: 87.2 fL (ref 80.0–100.0)
Monocytes Absolute: 0.4 10*3/uL (ref 0.1–1.0)
Monocytes Relative: 8 %
Neutro Abs: 3.4 10*3/uL (ref 1.7–7.7)
Neutrophils Relative %: 65 %
Platelets: 256 10*3/uL (ref 150–400)
RBC: 3.04 MIL/uL — ABNORMAL LOW (ref 3.87–5.11)
RDW: 14.2 % (ref 11.5–15.5)
WBC: 5.2 10*3/uL (ref 4.0–10.5)
nRBC: 0 % (ref 0.0–0.2)

## 2021-03-15 LAB — TYPE AND SCREEN
ABO/RH(D): AB POS
Antibody Screen: NEGATIVE

## 2021-03-15 LAB — ABO/RH: ABO/RH(D): AB POS

## 2021-03-15 LAB — SURGICAL PATHOLOGY

## 2021-03-15 MED ORDER — AMLODIPINE BESYLATE 5 MG PO TABS: 5.0000 mg | ORAL_TABLET | Freq: Every day | ORAL | Status: DC

## 2021-03-15 NOTE — Progress Notes (Signed)
Kara Mullins is about the same.  Apparently she was found to problems with low blood pressure.  I suppose this could be from her cardiac issue.  I know that cardiology is following her quite closely.  Are not too sure as to why her hemoglobin dropped so much.  On 5/31, the hemoglobin is 9.4.  Yesterday hemoglobin was 8.1.  Unfortunate, no labs have been drawn yet this morning.  She did do 24-hour urine.  I do not have those results back as of yet.  She had her bone marrow biopsy done on Tuesday.  Again, no results are back yet.  We really have to see what her hemoglobin is.  She does have a relatively low erythropoietin level so she would be a candidate for ESA.  I think if her hemoglobin is no better, then she buys going had to be transfused.  It is still not clear whether or not she has amyloidosis.  I think it would really be helpful to get a rectal biopsy or a abdominal fat pad biopsy to try to help confirm a diagnosis of amyloid.  As always, we had a very good prayer this morning.  She has a very strong faith.  Lattie Haw, MD  Philippians 4:13

## 2021-03-15 NOTE — Plan of Care (Signed)

## 2021-03-15 NOTE — Plan of Care (Signed)

## 2021-03-15 NOTE — Progress Notes (Addendum)
Progress Note  Patient Name: Kara Mullins Date of Encounter: 03/15/2021  [facility] Cardiologist: Donato Heinz, MD   Subjective   Patient states she feels well, she states she was not released yesterday because her blood pressure was low and she felt a little dizzy with position change. She asks what is a good BP. She is tearful and felt stressed with her current workup.   Inpatient Medications    Scheduled Meds: . carvedilol  12.5 mg Oral BID WC  . enoxaparin (LOVENOX) injection  40 mg Subcutaneous Q24H  . escitalopram  10 mg Oral Daily  . famotidine  20 mg Oral BID  . furosemide  40 mg Oral Daily  . spironolactone  12.5 mg Oral Daily   Continuous Infusions: . promethazine (PHENERGAN) injection (IM or IVPB) 12.5 mg (03/06/21 1306)   PRN Meds: acetaminophen **OR** acetaminophen, hydrALAZINE, ondansetron **OR** ondansetron (ZOFRAN) IV, polyethylene glycol, promethazine (PHENERGAN) injection (IM or IVPB)   Vital Signs    Vitals:   03/15/21 0014 03/15/21 0528 03/15/21 0531 03/15/21 0813  BP: 114/68 124/69  (!) 153/71  Pulse: (!) 55 (!) 58  60  Resp: 16 16    Temp: 99.1 F (37.3 C) 99 F (37.2 C)    TempSrc:      SpO2: 93% 95%    Weight:   72.3 kg     Intake/Output Summary (Last 24 hours) at 03/15/2021 1047 Last data filed at 03/15/2021 0530 Gross per 24 hour  Intake --  Output 500 ml  Net -500 ml   Last 3 Weights 03/15/2021 03/14/2021 03/13/2021  Weight (lbs) 159 lb 8 oz 160 lb 11.5 oz 166 lb 14.2 oz  Weight (kg) 72.349 kg 72.9 kg 75.7 kg      Telemetry    Sinus to sinus bradycardia HR 50-60s , PVCs, artifacts - Personally Reviewed  ECG    No new tracings - Personally Reviewed  Physical Exam   GEN: No acute distress.   Neck: No JVD Cardiac: RRR, no murmurs, rubs, or gallops.  Respiratory: Clear to auscultation bilaterally. On room air.  GI: Soft, nontender, non-distended  MS: Chronic BLE lymphedema, No deformity. Neuro:  Nonfocal  Psych:  Normal affect   Labs    High Sensitivity Troponin:   Recent Labs  Lab 03/04/21 0053 03/04/21 2144  TROPONINIHS 29* 31*      Chemistry Recent Labs  Lab 03/10/21 0413 03/11/21 0442 03/13/21 0413 03/14/21 0437 03/15/21 0809  NA 140   < > 141 141 140  K 3.7   < > 3.5 3.7 3.9  CL 102   < > 103 104 102  CO2 34*   < > 35* 31 33*  GLUCOSE 78   < > 106* 95 117*  BUN 30*   < > 29* 27* 27*  CREATININE 1.56*   < > 1.67* 1.47* 1.60*  CALCIUM 8.2*   < > 7.9* 7.9* 8.1*  PROT 5.6*  --   --   --  5.9*  ALBUMIN 2.1*  --   --   --  2.3*  AST 15  --   --   --  24  ALT 13  --   --   --  17  ALKPHOS 75  --   --   --  73  BILITOT 0.6  --   --   --  0.4  GFRNONAA 36*   < > 33* 39* 35*  ANIONGAP 4*   < > 3* 6  5   < > = values in this interval not displayed.     Hematology Recent Labs  Lab 03/13/21 1635 03/14/21 0437 03/15/21 0809  WBC 4.5 4.7 5.2  RBC 3.50* 2.99* 3.04*  HGB 9.4* 8.1* 8.2*  HCT 30.3* 26.1* 26.5*  MCV 86.6 87.3 87.2  MCH 26.9 27.1 27.0  MCHC 31.0 31.0 30.9  RDW 14.2 14.3 14.2  PLT 286 240 256    BNPNo results for input(s): BNP, PROBNP in the last 168 hours.   DDimer No results for input(s): DDIMER in the last 168 hours.   Radiology    CT BIOPSY  Result Date: 03/13/2021 INDICATION: Amyloidosis EXAM: CT GUIDED RIGHT ILIAC BONE MARROW ASPIRATION AND CORE BIOPSY Date:  03/13/2021 03/13/2021 1:00 pm Radiologist:  M. Daryll Brod, MD Guidance:  CT FLUOROSCOPY TIME:  Fluoroscopy Time: None. MEDICATIONS: 1% lidocaine local ANESTHESIA/SEDATION: 1.5 mg IV Versed; 75 mcg IV Fentanyl Moderate Sedation Time:  10 minutes The patient was continuously monitored during the procedure by the interventional radiology nurse under my direct supervision. CONTRAST:  None. COMPLICATIONS: None PROCEDURE: Informed consent was obtained from the patient following explanation of the procedure, risks, benefits and alternatives. The patient understands, agrees and consents for the procedure. All  questions were addressed. A time out was performed. The patient was positioned prone and non-contrast localization CT was performed of the pelvis to demonstrate the iliac marrow spaces. Maximal barrier sterile technique utilized including caps, mask, sterile gowns, sterile gloves, large sterile drape, hand hygiene, and Betadine prep. Under sterile conditions and local anesthesia, an 11 gauge coaxial bone biopsy needle was advanced into the right iliac marrow space. Needle position was confirmed with CT imaging. Initially, bone marrow aspiration was performed. Next, the 11 gauge outer cannula was utilized to obtain a right iliac bone marrow core biopsy. Needle was removed. Hemostasis was obtained with compression. The patient tolerated the procedure well. Samples were prepared with the cytotechnologist. No immediate complications. IMPRESSION: CT guided right iliac bone marrow aspiration and core biopsy. Electronically Signed   By: Jerilynn Mages.  Shick M.D.   On: 03/13/2021 16:08   CT BONE MARROW BIOPSY & ASPIRATION  Result Date: 03/13/2021 INDICATION: Amyloidosis EXAM: CT GUIDED RIGHT ILIAC BONE MARROW ASPIRATION AND CORE BIOPSY Date:  03/13/2021 03/13/2021 1:00 pm Radiologist:  M. Daryll Brod, MD Guidance:  CT FLUOROSCOPY TIME:  Fluoroscopy Time: None. MEDICATIONS: 1% lidocaine local ANESTHESIA/SEDATION: 1.5 mg IV Versed; 75 mcg IV Fentanyl Moderate Sedation Time:  10 minutes The patient was continuously monitored during the procedure by the interventional radiology nurse under my direct supervision. CONTRAST:  None. COMPLICATIONS: None PROCEDURE: Informed consent was obtained from the patient following explanation of the procedure, risks, benefits and alternatives. The patient understands, agrees and consents for the procedure. All questions were addressed. A time out was performed. The patient was positioned prone and non-contrast localization CT was performed of the pelvis to demonstrate the iliac marrow spaces. Maximal  barrier sterile technique utilized including caps, mask, sterile gowns, sterile gloves, large sterile drape, hand hygiene, and Betadine prep. Under sterile conditions and local anesthesia, an 11 gauge coaxial bone biopsy needle was advanced into the right iliac marrow space. Needle position was confirmed with CT imaging. Initially, bone marrow aspiration was performed. Next, the 11 gauge outer cannula was utilized to obtain a right iliac bone marrow core biopsy. Needle was removed. Hemostasis was obtained with compression. The patient tolerated the procedure well. Samples were prepared with the cytotechnologist. No immediate complications. IMPRESSION: CT  guided right iliac bone marrow aspiration and core biopsy. Electronically Signed   By: Jerilynn Mages.  Shick M.D.   On: 03/13/2021 16:08    Cardiac Studies   Echo 03/05/21: 1. Left ventricular ejection fraction, by estimation, is 45%. The left  ventricle has mildly decreased function. The left ventricle demonstrates  global hypokinesis. There is severe left ventricular hypertrophy. Left  ventricular diastolic parameters are  consistent with Grade I diastolic dysfunction (impaired relaxation). Would  consider cardiac amyloidosis.  2. Right ventricular systolic function is normal. The right ventricular  size is normal. There is mildly elevated pulmonary artery systolic  pressure. The estimated right ventricular systolic pressure is 09.3 mmHg.  3. Left atrial size was severely dilated.  4. The mitral valve is normal in structure. Mild to moderate mitral valve  regurgitation. No evidence of mitral stenosis.  5. The aortic valve is tricuspid. Aortic valve regurgitation is trivial.  No aortic stenosis is present.  6. Aortic dilatation noted. There is mild dilatation of the aortic root,  measuring 37 mm.  7. The inferior vena cava is dilated in size with <50% respiratory  variability, suggesting right atrial pressure of 15 mmHg.  8. There is a left  pleural effusion and trivial pericardial effusion.   Patient Profile     70 y.o. female with a history of HTN, lymphedema, and anxiety. She presented with HFrEF exacerbation with echo concerning for amyloid.   Assessment & Plan    Acute systolic and diastolic heart failure Mild to moderate MR Sinus bradycardia, asymptomatic  Suspected cardiac amyloidosis - presented with shortness of breath, cough, peripheral edema, and nausea and vomiting - BNP 1196 POA - CTA chest with pulmonary edema POA - Echo with EF 45%, with suspicion for cardiac amyloidosis, see report for details - Amyloid work-up: kappa/lambda light chain high,  SPEP with a single peak (M-spike) in the gamma region which may represent monoclonal protein, serum and urine  immunofixation shows IgG monoclonal protein with kappa light chain specificity.  Presentation concerning for AL amyloid, hematology consulted, s/p CT guided right iliac bone marrow aspiration and biopsy 03/13/21 - weight 179 >159 pounds, net -14.4L  since admission - s/p IV diuresis, transitioned to PO lasix 69m daily on 03/11/21, appears euvolemic today, continue current dose at discharge   - GDMT: continue coreg 12.5 mg BID (dose reduced per IM for hypotension), spironolactone 12.539mdaily, no addition of ARB/ACEI/ARNI due to AKI, may consider  hydralazine + Imdur if BP allows in the future  - outpatient follow up appointment with Dr ScGardiner Rhymes scheduled on 03/19/21, cardiac MRI can be done outpatient to allow renal recovery   AKI on CKD stage III - Cr was 0.61 back in 2021, 1.29 with GFR 45 POA, peaked at 1.98 on 5/26, now fluctuate between 1.4-1.6 - CT 03/08/21 showed Moderate left hydronephrosis is noted without definite ureteral dilatation. - Nephrology and Urology consulted, felt AKI due to contrast nephropathy and over-diuresis, "CT demonstrates what appears to be a chronic UPJ obstruction of the left kidney without significant loss of parenchyma or evidence  that this is clinically significant in any way, recommend renal ultrasound in 4-6 weeks". - renal index is now recovering on PO lasix and spironolactone, avoid ACEI/ARB/ARNI at this time  Hypertension - BP appears labile per VS records, BP 95/60 yesterday with dizziness, BP 124/69 -153/71 today, Coreg was reduced to 12.52m21mID per IM yesterday, will DC amlodipine 3m38mday, continue lasix 40mg73mly and spironolactone 12.52mg d21my - re-evaluate  BP in the office , advised patient to monitor BP at home and bring logs in  Anemia Left adnexal complex cyst Elevated D-dimer negative for PE and DVT  - managed per primary team    Outpatient follow up appointment with Dr Gardiner Rhyme is scheduled on 03/19/21  For questions or updates, please contact [facility] Please consult [email or web address] for contact info under     Signed, Margie Billet, NP  03/15/2021, 10:47 AM     History and all data above reviewed.  Patient examined.  I agree with the findings as above.   Breathing back to baseline. The patient exam reveals COR:RRR  ,  Lungs: Clear  ,  Abd: Positive bowel sounds, no rebound no guarding , Ext Non pitting edema  .  All available labs, radiology testing, previous records reviewed. Agree with documented assessment and plan.   BPs labile.  Agree with stopping Norvasc.  Continue current diuresis and lower dose beta blocker as changed yesterday.  Follow up as above.   Jeneen Rinks Taneah Masri  12:44 PM  03/15/2021

## 2021-03-18 NOTE — Progress Notes (Deleted)
Cardiology Office Note:    Date:  03/18/2021   ID:  Kara Mullins, DOB 04/14/1951, MRN [id number]  PCP:  Forrest Moron, MD  Cardiologist:  Donato Heinz, MD  Electrophysiologist:  None   Referring MD: Forrest Moron, MD   No chief complaint on file. ***  History of Present Illness:    Kara Mullins is a 70 y.o. female with a hx of chronic combined systolic metastatic heart failure, hypertension, lymphedema who presents for hospital follow-up.  She was admitted to [facility] on 03/05/2021 with shortness of breath.  Echocardiogram showed LVEF 45%, severe LVH, concerning for cardiac amyloid.  Work-up for amyloidosis showed abnormal light chains, hematology was consulted underwent bone marrow biopsy.  Course is complicated by AKI thought to be secondary to contrast nephropathy.  Nephrology was consulted and she was diuresed with IV Lasix with improvement in her kidney function.  She was discharged on oral Lasix 40 mg daily  Past Medical History:  Diagnosis Date  . Hypertension     No past surgical history on file.  Current Medications: No outpatient medications have been marked as taking for the 03/19/21 encounter (Appointment) with Donato Heinz, MD.     Allergies:   Patient has no known allergies.   Social History   Socioeconomic History  . Marital status: Divorced    Spouse name: Not on file  . Number of children: Not on file  . Years of education: Not on file  . Highest education level: Not on file  Occupational History  . Not on file  Tobacco Use  . Smoking status: Never Smoker  . Smokeless tobacco: Never Used  Vaping Use  . Vaping Use: Never used  Substance and Sexual Activity  . Alcohol use: Not Currently  . Drug use: Never  . Sexual activity: Not on file  Other Topics Concern  . Not on file  Social History Narrative  . Not on file   Social Determinants of Health   Financial Resource Strain: Not on file  Food Insecurity: Not on file   Transportation Needs: Not on file  Physical Activity: Not on file  Stress: Not on file  Social Connections: Not on file     Family History: The patient's ***Family history is unknown by patient.  ROS:   Please see the history of present illness.    *** All other systems reviewed and are negative.  EKGs/Labs/Other Studies Reviewed:    The following studies were reviewed today: ***  EKG:  EKG is *** ordered today.  The ekg ordered today demonstrates ***  Recent Labs: 03/04/2021: B Natriuretic Peptide 1,196.9 03/12/2021: Magnesium 2.0 03/15/2021: ALT 17; BUN 27; Creatinine, Ser 1.60; Hemoglobin 8.2; Platelets 256; Potassium 3.9; Sodium 140  Recent Lipid Panel No results found for: CHOL, TRIG, HDL, CHOLHDL, VLDL, LDLCALC, LDLDIRECT  Physical Exam:    VS:  There were no vitals taken for this visit.    Wt Readings from Last 3 Encounters:  03/15/21 159 lb 8 oz (72.3 kg)     GEN: *** Well nourished, well developed in no acute distress HEENT: Normal NECK: No JVD; No carotid bruits LYMPHATICS: No lymphadenopathy CARDIAC: ***RRR, no murmurs, rubs, gallops RESPIRATORY:  Clear to auscultation without rales, wheezing or rhonchi  ABDOMEN: Soft, non-tender, non-distended MUSCULOSKELETAL:  No edema; No deformity  SKIN: Warm and dry NEUROLOGIC:  Alert and oriented x 3 PSYCHIATRIC:  Normal affect   ASSESSMENT:    No diagnosis found. PLAN:  Chronic systolic and diastolic heart failure: Diagnosed during recent admission, with EF 45% and severe LVH on echo but relatively low voltage on EKG, concerning for cardiac amyloidosis.  Amyloid work-up showed abnormal light chains and underwent bone marrow biopsy with hematology. -Recommend cardiac MRI to evaluate for amyloidosis -Continue carvedilol 12.5 mg twice daily -Continue spironolactone 12.5 mg daily -Has not been on ACE/ARB/Arni due to renal dysfunction -Continue Lasix 40 mg daily -Check BMP  AKI: Creatinine 1.3 on admission on  03/04/2021, peaked at 2.0.  Was 1.60 on discharge on 03/15/2021  Hypertension: On carvedilol, spironolactone, and Lasix  RTC in***  Medication Adjustments/Labs and Tests Ordered: Current medicines are reviewed at length with the patient today.  Concerns regarding medicines are outlined above.  No orders of the defined types were placed in this encounter.  No orders of the defined types were placed in this encounter.   There are no Patient Instructions on file for this visit.   Signed, Donato Heinz, MD  03/18/2021 10:33 PM    [facility]

## 2021-03-19 ENCOUNTER — Ambulatory Visit (INDEPENDENT_AMBULATORY_CARE_PROVIDER_SITE_OTHER): Payer: Medicare Other | Admitting: Cardiology

## 2021-03-19 ENCOUNTER — Encounter: Payer: Self-pay | Admitting: Cardiology

## 2021-03-19 ENCOUNTER — Other Ambulatory Visit: Payer: Self-pay

## 2021-03-19 VITALS — BP 158/70 | HR 57 | Ht 65.0 in | Wt 154.8 lb

## 2021-03-19 DIAGNOSIS — I5042 Chronic combined systolic (congestive) and diastolic (congestive) heart failure: Secondary | ICD-10-CM

## 2021-03-19 DIAGNOSIS — N179 Acute kidney failure, unspecified: Secondary | ICD-10-CM

## 2021-03-19 DIAGNOSIS — I10 Essential (primary) hypertension: Secondary | ICD-10-CM

## 2021-03-19 NOTE — Patient Instructions (Signed)
Medication Instructions:  Your physician recommends that you continue on your current medications as directed. Please refer to the Current Medication list given to you today.  *If you need a refill on your cardiac medications before your next appointment, please call your pharmacy*   Lab Work: BMET, CBC, Mag today  If you have labs (blood work) drawn today and your tests are completely normal, you will receive your results only by: Marland Kitchen MyChart Message (if you have MyChart) OR . A paper copy in the mail If you have any lab test that is abnormal or we need to change your treatment, we will call you to review the results.   Testing/Procedures: Your physician has requested that you have a cardiac MRI. Cardiac MRI uses a computer to create images of your heart as its beating, producing both still and moving pictures of your heart and major blood vessels. For further information please visit [email or web address] Please follow the instruction sheet given to you today for more information.  Follow-Up: At [facility], you and your health needs are our priority.  As part of our continuing mission to provide you with exceptional heart care, we have created designated Provider Care Teams.  These Care Teams include your primary Cardiologist (physician) and Advanced Practice Providers (APPs -  Physician Assistants and Nurse Practitioners) who all work together to provide you with the care you need, when you need it.  We recommend signing up for the patient portal called "MyChart".  Sign up information is provided on this After Visit Summary.  MyChart is used to connect with patients for Virtual Visits (Telemedicine).  Patients are able to view lab/test results, encounter notes, upcoming appointments, etc.  Non-urgent messages can be sent to your provider as well.   To learn more about what you can do with MyChart, go to [email or web address].    Your next appointment:   1 month(s)  The format for your  next appointment:   In Person  Provider:   Oswaldo Milian, MD

## 2021-03-19 NOTE — Progress Notes (Signed)
Cardiology Office Note:    Date:  03/19/2021   ID:  [name], DOB 06-16-1951, MRN [id number]  PCP:  Pcp, No  Cardiologist:  Donato Heinz, MD  Electrophysiologist:  None   Referring MD: Forrest Moron, MD   Chief Complaint  Patient presents with  . Congestive Heart Failure    History of Present Illness:    Sherill Mangen is a 70 y.o. female with a hx of chronic combined systolic metastatic heart failure, hypertension, lymphedema who presents for hospital follow-up.  She was admitted to [facility] on 03/05/2021 with shortness of breath.  Echocardiogram showed LVEF 45%, severe LVH, concerning for cardiac amyloid.  Work-up for amyloidosis showed abnormal light chains, hematology was consulted underwent bone marrow biopsy.  Course is complicated by AKI thought to be secondary to contrast nephropathy.  Nephrology was consulted and she was diuresed with IV Lasix with improvement in her kidney function.  She was discharged on oral Lasix 40 mg daily.  Today, she is accompanied by her daughter. Since her visit to the ED, she is feeling better each day. Her LE edema is minimal today, and she regularly wears compression socks. Usually she does not monitor her blood pressure at home. For exercise she goes on walks, and does not have exertional symptoms. She denies any chest pain, shortness of breath, or palpitations. No headaches, lightheadedness, or syncope to report. Also has no orthopnea or PND.   Past Medical History:  Diagnosis Date  . Hypertension     No past surgical history on file.  Current Medications: Current Meds  Medication Sig  . carvedilol (COREG) 12.5 MG tablet Take 1 tablet (12.5 mg total) by mouth 2 (two) times daily with a meal.  . famotidine (PEPCID) 20 MG tablet Take 1 tablet (20 mg total) by mouth daily.  . furosemide (LASIX) 40 MG tablet Take 1 tablet (40 mg total) by mouth daily.  Marland Kitchen spironolactone (ALDACTONE) 25 MG tablet Take 0.5 tablets (12.5 mg  total) by mouth daily.  . [DISCONTINUED] escitalopram (LEXAPRO) 10 MG tablet Take 1 tablet by mouth daily.     Allergies:   Patient has no known allergies.   Social History   Socioeconomic History  . Marital status: Divorced    Spouse name: Not on file  . Number of children: Not on file  . Years of education: Not on file  . Highest education level: Not on file  Occupational History  . Not on file  Tobacco Use  . Smoking status: Never Smoker  . Smokeless tobacco: Never Used  Vaping Use  . Vaping Use: Never used  Substance and Sexual Activity  . Alcohol use: Not Currently  . Drug use: Never  . Sexual activity: Not on file  Other Topics Concern  . Not on file  Social History Narrative  . Not on file   Social Determinants of Health   Financial Resource Strain: Not on file  Food Insecurity: Not on file  Transportation Needs: Not on file  Physical Activity: Not on file  Stress: Not on file  Social Connections: Not on file     Family History: The patient's Family history is unknown by patient.  ROS:   Please see the history of present illness.    (+) LE edema All other systems reviewed and are negative.  EKGs/Labs/Other Studies Reviewed:    The following studies were reviewed today:  Echo 03/05/2021: 1. Left ventricular ejection fraction, by estimation, is 45%. The left  ventricle  has mildly decreased function. The left ventricle demonstrates  global hypokinesis. There is severe left ventricular hypertrophy. Left  ventricular diastolic parameters are  consistent with Grade I diastolic dysfunction (impaired relaxation). Would  consider cardiac amyloidosis.  2. Right ventricular systolic function is normal. The right ventricular  size is normal. There is mildly elevated pulmonary artery systolic  pressure. The estimated right ventricular systolic pressure is 26.9 mmHg.  3. Left atrial size was severely dilated.  4. The mitral valve is normal in structure. Mild  to moderate mitral valve  regurgitation. No evidence of mitral stenosis.  5. The aortic valve is tricuspid. Aortic valve regurgitation is trivial.  No aortic stenosis is present.  6. Aortic dilatation noted. There is mild dilatation of the aortic root,  measuring 37 mm.  7. The inferior vena cava is dilated in size with <50% respiratory  variability, suggesting right atrial pressure of 15 mmHg.  8. There is a left pleural effusion and trivial pericardial effusion.   [place] LE Venous Bilateral 03/05/2021: BILATERAL:  - No evidence of deep vein thrombosis seen in the lower extremities,  bilaterally.  - No evidence of superficial venous thrombosis in the lower extremities,  bilaterally.  -No evidence of popliteal cyst, bilaterally.  -Significant subcutaneous edema found throughout lower extremities.  CT Angio Chest 03/05/2021: IMPRESSION: 1. No pulmonary embolus. 2. Pulmonary edema with trace to small volume left and small to moderate volume right pleural effusions. 3.  Aortic Atherosclerosis (ICD10-I70.0).  EKG:   03/19/2021: NSR, rate 57, nonspecific T wave flattening  Recent Labs: 03/04/2021: B Natriuretic Peptide 1,196.9 03/12/2021: Magnesium 2.0 03/15/2021: ALT 17; BUN 27; Creatinine, Ser 1.60; Hemoglobin 8.2; Platelets 256; Potassium 3.9; Sodium 140  Recent Lipid Panel No results found for: CHOL, TRIG, HDL, CHOLHDL, VLDL, LDLCALC, LDLDIRECT  Physical Exam:    VS:  BP (!) 158/70 (BP Location: Right Arm, Patient Position: Sitting)   Pulse (!) 57   Ht 5' 5"  (1.651 m)   Wt 154 lb 12.8 oz (70.2 kg)   SpO2 99%   BMI 25.76 kg/m     Wt Readings from Last 3 Encounters:  03/19/21 154 lb 12.8 oz (70.2 kg)  03/15/21 159 lb 8 oz (72.3 kg)     GEN: Well nourished, well developed in no acute distress HEENT: Normal NECK: No JVD; No carotid bruits LYMPHATICS: No lymphadenopathy CARDIAC: RRR, no murmurs, rubs, gallops RESPIRATORY:  Clear to auscultation without rales, wheezing or  rhonchi  ABDOMEN: Soft, non-tender, non-distended MUSCULOSKELETAL:  Trace edema; Compression stockings in place SKIN: Warm and dry NEUROLOGIC:  Alert and oriented x 3 PSYCHIATRIC:  Normal affect   ASSESSMENT:    1. Chronic combined systolic and diastolic heart failure ([place])   2. AKI (acute kidney injury) ([place])   3. Essential hypertension    PLAN:    Chronic systolic and diastolic heart failure: Diagnosed during recent admission, with EF 45% and severe LVH on echo but relatively low voltage on EKG, concerning for cardiac amyloidosis.  Amyloid work-up showed abnormal light chains and underwent bone marrow biopsy with hematology. -Recommend cardiac MRI to evaluate for amyloidosis -Continue carvedilol 12.5 mg twice daily -Continue spironolactone 12.5 mg daily -Has not been on ACE/ARB/Arni due to renal dysfunction -Continue Lasix 40 mg daily -Check BMP  AKI: Creatinine 1.3 on admission on 03/04/2021, peaked at 2.0.  Was 1.60 on discharge on 03/15/2021  Hypertension: On carvedilol, spironolactone, and Lasix.  BP elevated.  Will follow-up results of BMP and if renal function stable  plan to add losartan.  RTC in 1 month   Medication Adjustments/Labs and Tests Ordered: Current medicines are reviewed at length with the patient today.  Concerns regarding medicines are outlined above.  Orders Placed This Encounter  Procedures  . MR CARDIAC MORPHOLOGY W WO CONTRAST  . Basic metabolic panel  . CBC  . Magnesium  . EKG 12-Lead   No orders of the defined types were placed in this encounter.   Patient Instructions  Medication Instructions:  Your physician recommends that you continue on your current medications as directed. Please refer to the Current Medication list given to you today.  *If you need a refill on your cardiac medications before your next appointment, please call your pharmacy*   Lab Work: BMET, CBC, Mag today  If you have labs (blood work) drawn today and your tests  are completely normal, you will receive your results only by: Marland Kitchen MyChart Message (if you have MyChart) OR . A paper copy in the mail If you have any lab test that is abnormal or we need to change your treatment, we will call you to review the results.   Testing/Procedures: Your physician has requested that you have a cardiac MRI. Cardiac MRI uses a computer to create images of your heart as its beating, producing both still and moving pictures of your heart and major blood vessels. For further information please visit [email or web address] Please follow the instruction sheet given to you today for more information.  Follow-Up: At [facility], you and your health needs are our priority.  As part of our continuing mission to provide you with exceptional heart care, we have created designated Provider Care Teams.  These Care Teams include your primary Cardiologist (physician) and Advanced Practice Providers (APPs -  Physician Assistants and Nurse Practitioners) who all work together to provide you with the care you need, when you need it.  We recommend signing up for the patient portal called "MyChart".  Sign up information is provided on this After Visit Summary.  MyChart is used to connect with patients for Virtual Visits (Telemedicine).  Patients are able to view lab/test results, encounter notes, upcoming appointments, etc.  Non-urgent messages can be sent to your provider as well.   To learn more about what you can do with MyChart, go to [email or web address].    Your next appointment:   1 month(s)  The format for your next appointment:   In Person  Provider:   Oswaldo Milian, MD        [facility] Stumpf,acting as a scribe for Donato Heinz, MD.,have documented all relevant documentation on the behalf of Donato Heinz, MD,as directed by  Donato Heinz, MD while in the presence of Donato Heinz, MD.  I, Donato Heinz, MD, have  reviewed all documentation for this visit. The documentation on 03/19/21 for the exam, diagnosis, procedures, and orders are all accurate and complete.   Signed, Donato Heinz, MD  03/19/2021 3:33 PM    [facility]

## 2021-03-19 NOTE — Discharge Summary (Signed)
[facility] Discharge Summary   Patient: Kara Mullins XBM:[id number]  PCP: Pcp, No  Date of admission: 03/04/2021   Date of discharge: 03/15/2021     Discharge Diagnoses:   Principal Problem:   Acute CHF (congestive heart failure) ([place]) Active Problems:   Hypertensive urgency   Nausea and vomiting   Elevated troponin level not due myocardial infarction   Elevated d-dimer   Generalized anxiety disorder   Acute congestive heart failure (HCC)   CHF exacerbation (HCC)   Essential hypertension   AKI (acute kidney injury) ([place])   Acute combined systolic and diastolic HF (heart failure) ([place])  Admitted From: home Disposition:  Home   Recommendations for Outpatient Follow-up:  1. PCP: Follow-up with PCP in 1 week.  Follow-up with cardiology as recommended. 2. Follow up LABS/TEST:  none   Follow-up Information    Donato Heinz, MD Follow up on 03/19/2021.   Specialties: Cardiology, Radiology Why: at 3:20 PM for your cardiology follow up appointment  Contact information: [address] [place] [address] [phone]        Forrest Moron, MD. Schedule an appointment as soon as possible for a visit in 1 week(s).   Specialty: Internal Medicine Contact information: [address] [place] [address] [phone]              Discharge Instructions    Diet - low sodium heart healthy   Complete by: As directed    Diet - low sodium heart healthy   Complete by: As directed    Increase activity slowly   Complete by: As directed    Increase activity slowly   Complete by: As directed    No wound care   Complete by: As directed    No wound care   Complete by: As directed       Diet recommendation: Cardiac diet  Activity: The patient is advised to gradually reintroduce usual activities, as tolerated  Discharge Condition: stable  Code Status: Full code   History of present illness: As per the H and P  dictated on admission, "70 year old female with past medical history of hypertension, lymphedema and anxiety disorder who presents to [facility] hospital emergency department with multiple complaints including shortness of breath, cough, peripheral edema nausea and vomiting.  Patient is a somewhat poor historian due to Ativan received in the emergency department and therefore the majority the history is been obtained from the daughter who is at the bedside.  Apparently the patient has been experiencing a several month history of increasing bilateral lower extremity edema.  Over the past several weeks in particular the patient seems to have also been developing paroxysmal nocturnal dyspnea and pillow orthopnea.  For approximately past 1 week the patient has now also begun to develop a cough, intermittently productive with white frothy sputum.  Patient denies any associated chest pain, fevers, recent travel, sick contacts or contact with confirmed COVID-19 infection  Over the past 24 hours the patient has now begun to develop nausea with frequent bouts of vomiting.  This constellation of symptoms the patient then presented to [facility] emergency department for evaluation.  Upon evaluation in the emergency department clinically patient was felt to be volume overloaded likely secondary to undiagnosed congestive heart failure.  A dose of intravenous Lasix was ordered.  Patient was also given several antiemetics for ongoing nausea and vomiting."  Hospital Course:  Summary of her active problems in the  hospital is as following.  New/acute combined systolic and diastolic congestive heart failure: echocardiogram showed global hypokinesis of left ventricle, EF 10%, grade 1 diastolic dysfunction. Consider cardiac amyloidosis. Cardiology on board.  They suspect cardiac amyloidosis and they have initiated the work-up.  Will continue current dose.  Acute kidney injury:  1.29> 1.35> 1.59>  1.92> 1.98> 1.8> 1.56> 1.75> 1.73>1.67. Remains stable and appears to have a new baseline.   Hypokalemia: Resolved.  Left adnexal complex cyst:  Ultrasound confirmed benign cyst.  Nausea vomiting:  Symptoms resolved.  Abdominal x-ray negative for SBO.  Essential hypertension:  Blood pressureslightly labile but is still fairly controlled.  Continue Coreg.  Elevated D-dimer:  Ruled out of PE and DVT.  Sinus bradycardia: Now symptomatic. Will hold coreg for one dose and reduce dose going forward.  For now due to severe symptoms will monitor overnight  Possible AL amyloidosis:  Oncology consulted.  Underwent bone marrow biopsy. work-up and management defer to oncology.  Patient was ambulatory without any assistance. On the day of the discharge the patient's vitals were stable, and no other new acute medical condition were reported. The patient was felt safe to be discharge at Home with no therapy needed on discharge.  Consultants: Cardiology  Procedures: Echocardiogram   DISCHARGE MEDICATION: Allergies as of 03/15/2021   No Known Allergies     Medication List    STOP taking these medications   bisacodyl 10 MG/30ML Enem Commonly known as: FLEET   hydrochlorothiazide 25 MG tablet Commonly known as: HYDRODIURIL     TAKE these medications   carvedilol 12.5 MG tablet Commonly known as: COREG Take 1 tablet (12.5 mg total) by mouth 2 (two) times daily with a meal.   famotidine 20 MG tablet Commonly known as: PEPCID Take 1 tablet (20 mg total) by mouth daily.   furosemide 40 MG tablet Commonly known as: LASIX Take 1 tablet (40 mg total) by mouth daily.   spironolactone 25 MG tablet Commonly known as: ALDACTONE Take 0.5 tablets (12.5 mg total) by mouth daily.      Discharge Exam: Filed Weights   03/13/21 0500 03/14/21 0351 03/15/21 0531  Weight: 75.7 kg 72.9 kg 72.3 kg   Vitals:   03/15/21 0528 03/15/21 0813  BP: 124/69 (!) 153/71  Pulse:  (!) 58 60  Resp: 16   Temp: 99 F (37.2 C)   SpO2: 95%    General: Appear in mild distress, no Rash; Oral Mucosa Clear, moist. no Abnormal Neck Mass Or lumps, Conjunctiva normal  Cardiovascular: S1 and S2 Present, no Murmur, Respiratory: good respiratory effort, Bilateral Air entry present and CTA, no Crackles, no wheezes Abdomen: Bowel Sound present, Soft and no tenderness Extremities: no Pedal edema Neurology: alert and oriented to time, place, and person affect appropriate. no new focal deficit Gait not checked due to patient safety concerns  The results of significant diagnostics from this hospitalization (including imaging, microbiology, ancillary and laboratory) are listed below for reference.    Significant Diagnostic Studies: DG Chest 2 View  Result Date: 03/04/2021 CLINICAL DATA:  Hypertension. EXAM: CHEST - 2 VIEW COMPARISON:  None. FINDINGS: The heart size and mediastinal contours are within normal limits. Aortic arch calcifications. Streaky bibasilar airspace opacities. Increased interstitial markings. Trace right volume and small to moderate left volume pleural effusions. No pneumothorax. No acute osseous abnormality. IMPRESSION: Pulmonary edema with trace right and small to moderate left volume pleural effusions. Superimposed infection/inflammation not excluded. Followup PA and lateral chest X-ray is recommended in  3-4 weeks following therapy to ensure resolution. Electronically Signed   By: Iven Finn M.D.   On: 03/04/2021 22:40   CT ANGIO CHEST PE W OR WO CONTRAST  Result Date: 03/05/2021 CLINICAL DATA:  Pt presents from home with daughter, c/o n/v and SOB starting this evening. Denies CP, reports some abd discomfort. Last BM today. States she has not had home BP meds today. EXAM: CT ANGIOGRAPHY CHEST WITH CONTRAST TECHNIQUE: Multidetector CT imaging of the chest was performed using the standard protocol during bolus administration of intravenous contrast. Multiplanar CT  image reconstructions and MIPs were obtained to evaluate the vascular anatomy. CONTRAST:  128m OMNIPAQUE IOHEXOL 350 MG/ML SOLN COMPARISON:  Chest x-ray 03/04/2021 FINDINGS: Cardiovascular: Satisfactory opacification of the pulmonary arteries to the segmental level. No evidence of pulmonary embolism. Normal heart size. No pericardial effusion. Aorta atherosclerotic plaque. Mediastinum/Nodes: No enlarged mediastinal, hilar, or axillary lymph nodes. Thyroid gland, trachea, and esophagus demonstrate no significant findings. Lungs/Pleura: Interlobular septal wall thickening. Left upper lobe atelectasis. Passive atelectasis bilateral lower lobes. No focal consolidation. Few scattered pulmonary micronodules. No pulmonary mass. Trace to small volume left and small to moderate volume right pleural effusions. No pneumothorax. Upper Abdomen: No acute abnormality. Musculoskeletal: No abdominal wall hernia or abnormality. No suspicious lytic or blastic osseous lesions. No acute displaced fracture. Multilevel degenerative changes of the spine. Levoscoliosis of the thoracolumbar spine. Review of the MIP images confirms the above findings. IMPRESSION: 1. No pulmonary embolus. 2. Pulmonary edema with trace to small volume left and small to moderate volume right pleural effusions. 3.  Aortic Atherosclerosis (ICD10-I70.0). Electronically Signed   By: MIven FinnM.D.   On: 03/05/2021 05:36   [place] (TRANSABDOMINAL ONLY)  Result Date: 03/08/2021 CLINICAL DATA:  Adnexal cyst. EXAM: TRANSABDOMINAL ULTRASOUND OF PELVIS TECHNIQUE: Transabdominal ultrasound examination of the pelvis was performed including evaluation of the uterus, ovaries, adnexal regions, and pelvic cul-de-sac. COMPARISON:  CT 03/08/2021. FINDINGS: Uterus Measurements: 8.7 x 4.8 x 5.9 cm = volume: 128.3 mL. No fibroids or other mass visualized. Endometrium Thickness: 5.6.  No focal abnormality visualized. Right ovary Measurements: Not visualized Left ovary  Measurements: 6.3 x 5.9 x 5.5 cm = volume: 106.7 mL. 4.5 x 6.0 x 6.6 cm simple cyst left ovary Other findings:  Small amount of free pelvic fluid. IMPRESSION: 6.6 cm simple cyst left ovary. This is almost certainly benign, but follow up ultrasound is recommended in 1 year according to the Society of Radiologists in [place] (D LClovis Rileyet al. Management of Asymptomatic Ovarian and Other Adnexal Cysts Imaged at [place] Society of Radiologists in UL[place] GroveStatement 2010. Radiology 256 (Sept 2010): 9329-518). Right ovary not visualized. Small amount of free pelvic fluid. Electronically Signed   By: TMarcello Moores Register   On: 03/08/2021 15:57   [place]NAL  Result Date: 03/07/2021 CLINICAL DATA:  89year old with acute kidney injury. EXAM: RENAL / URINARY TRACT ULTRASOUND COMPLETE COMPARISON:  Chest CT 03/05/2021 FINDINGS: Right Kidney: Renal measurements: 12.7 x 4.0 x 6.5 cm = volume: 168 mL. Normal echogenicity. Mild hydronephrosis. No suspicious renal lesion. Left Kidney: Renal measurements: 13.6 x 6.4 x 6.4 = volume: 290 mL. Normal echogenicity. Moderate left hydronephrosis. No suspicious renal lesion. Anechoic cyst in the mid left kidney that measures up to 5.3 cm. Bladder: Fluid in the urinary bladder.   Ureter jets are not identified. Other: Bilateral pleural effusions. IMPRESSION: 1. Bilateral hydronephrosis. Moderate left hydronephrosis and mild right hydronephrosis. 2. Left renal cyst. 3.  Bilateral pleural effusions. These results will be called to the ordering clinician or representative by the Radiologist Assistant, and communication documented in the PACS or [employer]. Electronically Signed   By: Markus Daft M.D.   On: 03/07/2021 16:37   CT BIOPSY  Result Date: 03/13/2021 INDICATION: Amyloidosis EXAM: CT GUIDED RIGHT ILIAC BONE MARROW ASPIRATION AND CORE BIOPSY Date:  03/13/2021 03/13/2021 1:00 pm Radiologist:  M. Daryll Brod, MD Guidance:  CT  FLUOROSCOPY TIME:  Fluoroscopy Time: None. MEDICATIONS: 1% lidocaine local ANESTHESIA/SEDATION: 1.5 mg IV Versed; 75 mcg IV Fentanyl Moderate Sedation Time:  10 minutes The patient was continuously monitored during the procedure by the interventional radiology nurse under my direct supervision. CONTRAST:  None. COMPLICATIONS: None PROCEDURE: Informed consent was obtained from the patient following explanation of the procedure, risks, benefits and alternatives. The patient understands, agrees and consents for the procedure. All questions were addressed. A time out was performed. The patient was positioned prone and non-contrast localization CT was performed of the pelvis to demonstrate the iliac marrow spaces. Maximal barrier sterile technique utilized including caps, mask, sterile gowns, sterile gloves, large sterile drape, hand hygiene, and Betadine prep. Under sterile conditions and local anesthesia, an 11 gauge coaxial bone biopsy needle was advanced into the right iliac marrow space. Needle position was confirmed with CT imaging. Initially, bone marrow aspiration was performed. Next, the 11 gauge outer cannula was utilized to obtain a right iliac bone marrow core biopsy. Needle was removed. Hemostasis was obtained with compression. The patient tolerated the procedure well. Samples were prepared with the cytotechnologist. No immediate complications. IMPRESSION: CT guided right iliac bone marrow aspiration and core biopsy. Electronically Signed   By: Jerilynn Mages.  Shick M.D.   On: 03/13/2021 16:08   DG Abd 2 Views  Result Date: 03/05/2021 CLINICAL DATA:  Nausea vomiting, mid abdominal discomfort since yesterday EXAM: ABDOMEN - 2 VIEW COMPARISON:  None. FINDINGS: The bowel gas pattern is normal. There is no evidence of free air. No radio-opaque calculi or other significant radiographic abnormality is seen. IMPRESSION: No evidence of bowel obstruction. Electronically Signed   By: Maurine Simmering   On: 03/05/2021 09:52   CT  BONE MARROW BIOPSY & ASPIRATION  Result Date: 03/13/2021 INDICATION: Amyloidosis EXAM: CT GUIDED RIGHT ILIAC BONE MARROW ASPIRATION AND CORE BIOPSY Date:  03/13/2021 03/13/2021 1:00 pm Radiologist:  M. Daryll Brod, MD Guidance:  CT FLUOROSCOPY TIME:  Fluoroscopy Time: None. MEDICATIONS: 1% lidocaine local ANESTHESIA/SEDATION: 1.5 mg IV Versed; 75 mcg IV Fentanyl Moderate Sedation Time:  10 minutes The patient was continuously monitored during the procedure by the interventional radiology nurse under my direct supervision. CONTRAST:  None. COMPLICATIONS: None PROCEDURE: Informed consent was obtained from the patient following explanation of the procedure, risks, benefits and alternatives. The patient understands, agrees and consents for the procedure. All questions were addressed. A time out was performed. The patient was positioned prone and non-contrast localization CT was performed of the pelvis to demonstrate the iliac marrow spaces. Maximal barrier sterile technique utilized including caps, mask, sterile gowns, sterile gloves, large sterile drape, hand hygiene, and Betadine prep. Under sterile conditions and local anesthesia, an 11 gauge coaxial bone biopsy needle was advanced into the right iliac marrow space. Needle position was confirmed with CT imaging. Initially, bone marrow aspiration was performed. Next, the 11 gauge outer cannula was utilized to obtain a right iliac bone marrow core biopsy. Needle was removed. Hemostasis was obtained with compression. The patient tolerated the procedure well. Samples were  prepared with the cytotechnologist. No immediate complications. IMPRESSION: CT guided right iliac bone marrow aspiration and core biopsy. Electronically Signed   By: Jerilynn Mages.  Shick M.D.   On: 03/13/2021 16:08   ECHOCARDIOGRAM COMPLETE  Result Date: 03/05/2021    ECHOCARDIOGRAM REPORT   Patient Name:   THREASA KINCH Date of Exam: 03/05/2021 Medical Rec #:  [id number]      Height:       60.0 in Accession  #:    [id number]     Weight:       160.0 lb Date of Birth:  1950/11/03     BSA:          1.698 m Patient Age:    74 years       BP:           182/85 mmHg Patient Gender: F              HR:           73 bpm. Exam Location:  Inpatient Procedure: 2D Echo, Cardiac Doppler and Color Doppler Indications:    I50.40* Unspecified combined systolic (congestive) and diastolic                 (congestive) heart failure  History:        Patient has no prior history of Echocardiogram examinations.                 Signs/Symptoms:Chest Pain. Elevated troponin. Elevated d dimer.  Sonographer:    Roseanna Rainbow RDCS Referring Phys: [id number] [place]  1. Left ventricular ejection fraction, by estimation, is 45%. The left ventricle has mildly decreased function. The left ventricle demonstrates global hypokinesis. There is severe left ventricular hypertrophy. Left ventricular diastolic parameters are consistent with Grade I diastolic dysfunction (impaired relaxation). Would consider cardiac amyloidosis.  2. Right ventricular systolic function is normal. The right ventricular size is normal. There is mildly elevated pulmonary artery systolic pressure. The estimated right ventricular systolic pressure is 10.0 mmHg.  3. Left atrial size was severely dilated.  4. The mitral valve is normal in structure. Mild to moderate mitral valve regurgitation. No evidence of mitral stenosis.  5. The aortic valve is tricuspid. Aortic valve regurgitation is trivial. No aortic stenosis is present.  6. Aortic dilatation noted. There is mild dilatation of the aortic root, measuring 37 mm.  7. The inferior vena cava is dilated in size with <50% respiratory variability, suggesting right atrial pressure of 15 mmHg.  8. There is a left pleural effusion and trivial pericardial effusion. FINDINGS  Left Ventricle: Left ventricular ejection fraction, by estimation, is 45%. The left ventricle has mildly decreased function. The left ventricle  demonstrates global hypokinesis. The left ventricular internal cavity size was normal in size. There is severe left ventricular hypertrophy. Left ventricular diastolic parameters are consistent with Grade I diastolic dysfunction (impaired relaxation). Right Ventricle: The right ventricular size is normal. No increase in right ventricular wall thickness. Right ventricular systolic function is normal. There is mildly elevated pulmonary artery systolic pressure. The tricuspid regurgitant velocity is 2.57  m/s, and with an assumed right atrial pressure of 15 mmHg, the estimated right ventricular systolic pressure is 71.2 mmHg. Left Atrium: Left atrial size was severely dilated. Right Atrium: Right atrial size was normal in size. Pericardium: There is a left pleural effusion. Trivial pericardial effusion is present. Mitral Valve: The mitral valve is normal in structure. Mild mitral annular calcification. Mild to moderate mitral valve regurgitation. No  evidence of mitral valve stenosis. Tricuspid Valve: The tricuspid valve is normal in structure. Tricuspid valve regurgitation is trivial. Aortic Valve: The aortic valve is tricuspid. Aortic valve regurgitation is trivial. No aortic stenosis is present. Pulmonic Valve: The pulmonic valve was normal in structure. Pulmonic valve regurgitation is trivial. Aorta: Aortic dilatation noted. There is mild dilatation of the aortic root, measuring 37 mm. Venous: The inferior vena cava is dilated in size with less than 50% respiratory variability, suggesting right atrial pressure of 15 mmHg. IAS/Shunts: No atrial level shunt detected by color flow Doppler.  LEFT VENTRICLE PLAX 2D LVIDd:         5.02 cm      Diastology LVIDs:         3.73 cm      LV e' medial:    4.38 cm/s LV PW:         1.86 cm      LV E/e' medial:  20.4 LV IVS:        1.53 cm      LV e' lateral:   5.51 cm/s LVOT diam:     1.80 cm      LV E/e' lateral: 16.2 LV SV:         62 LV SV Index:   37 LVOT Area:     2.54 cm   LV Volumes (MOD) LV vol d, MOD A2C: 88.2 ml LV vol d, MOD A4C: 113.0 ml LV vol s, MOD A2C: 46.4 ml LV vol s, MOD A4C: 55.5 ml LV SV MOD A2C:     41.8 ml LV SV MOD A4C:     113.0 ml LV SV MOD BP:      50.2 ml RIGHT VENTRICLE             IVC RV S prime:     14.10 cm/s  IVC diam: 2.58 cm TAPSE (M-mode): 2.3 cm LEFT ATRIUM             Index       RIGHT ATRIUM           Index LA diam:        3.30 cm 1.94 cm/m  RA Area:     13.60 cm LA Vol (A2C):   85.4 ml 50.30 ml/m RA Volume:   28.80 ml  16.96 ml/m LA Vol (A4C):   99.1 ml 58.37 ml/m LA Biplane Vol: 97.9 ml 57.67 ml/m  AORTIC VALVE LVOT Vmax:   110.00 cm/s LVOT Vmean:  80.500 cm/s LVOT VTI:    0.245 m  AORTA Ao Root diam: 3.70 cm Ao Asc diam:  3.50 cm MITRAL VALVE                TRICUSPID VALVE MV Area (PHT): 3.60 cm     TR Peak grad:   26.4 mmHg MV Decel Time: 211 msec     TR Vmax:        257.00 cm/s MR PISA:        0.57 cm MR PISA Radius: 0.30 cm     SHUNTS MV E velocity: 89.40 cm/s   Systemic VTI:  0.24 m MV A velocity: 115.00 cm/s  Systemic Diam: 1.80 cm MV E/A ratio:  0.78 Loralie Champagne MD Electronically signed by Loralie Champagne MD Signature Date/Time: 03/05/2021/8:08:38 PM    Final    CT RENAL STONE STUDY  Result Date: 03/08/2021 CLINICAL DATA:  Hydronephrosis. EXAM: CT ABDOMEN AND PELVIS WITHOUT CONTRAST TECHNIQUE: Multidetector CT imaging of the abdomen  and pelvis was performed following the standard protocol without IV contrast. COMPARISON:  Mar 07, 2021. FINDINGS: Lower chest: Large bilateral pleural effusions are noted with adjacent atelectasis of both lower lobes. Hepatobiliary: No focal liver abnormality is seen. No gallstones, gallbladder wall thickening, or biliary dilatation. Pancreas: Unremarkable. No pancreatic ductal dilatation or surrounding inflammatory changes. Spleen: Normal in size without focal abnormality. Adrenals/Urinary Tract: Adrenal glands appear normal. Small nonobstructive right renal calculus is noted. Moderate left  hydronephrosis is noted without definite ureteral dilatation. Potentially this may be due to ureteropelvic junction obstruction. Urinary bladder contains contrast from previous CT scan, but is otherwise unremarkable. Stomach/Bowel: The stomach appears normal. There is no evidence of bowel obstruction or inflammation. Vascular/Lymphatic: Aortic atherosclerosis. No enlarged abdominal or pelvic lymph nodes. Reproductive: Uterus is unremarkable. Probable 6.3 cm left adnexal hyperdense or complex cyst is noted. Other: Moderate anasarca is noted. Moderate size fat containing periumbilical hernia is noted. Minimal ascites may be present in the pelvis. Musculoskeletal: No acute or significant osseous findings. IMPRESSION: Large bilateral pleural effusions are noted with adjacent atelectasis of both lower lobes. Moderate left hydronephrosis is noted without definite ureteral dilatation. Potentially this may related to ureteropelvic junction obstruction or stenosis. No renal or ureteral calculi are noted. Small nonobstructive right renal calculus is noted. Probable 6.3 cm left adnexal hyperdense or complex cyst. Pelvic ultrasound is recommended for further evaluation. Moderate anasarca. Minimal ascites. Aortic Atherosclerosis (ICD10-I70.0). Electronically Signed   By: Marijo Conception M.D.   On: 03/08/2021 10:05   VAS [place] LOWER EXTREMITY VENOUS (DVT)  Result Date: 03/05/2021  Lower Venous DVT Study Patient Name:  FLORIDE HUTMACHER  Date of Exam:   03/05/2021 Medical Rec #: [id number]       Accession #:    [id number] Date of Birth: 21-Aug-1951      Patient Gender: F Patient Age:   69Y Exam Location:  [facility] Procedure:      VAS [place] LOWER EXTREMITY VENOUS (DVT) Referring Phys: [id number] [place] --------------------------------------------------------------------------------  Indications: Edema.  Comparison Study: No previous exams. Performing Technologist: Rogelia Rohrer  Examination Guidelines: A complete  evaluation includes B-mode imaging, spectral Doppler, color Doppler, and power Doppler as needed of all accessible portions of each vessel. Bilateral testing is considered an integral part of a complete examination. Limited examinations for reoccurring indications may be performed as noted. The reflux portion of the exam is performed with the patient in reverse Trendelenburg.  +---------+---------------+---------+-----------+----------+--------------+ RIGHT    CompressibilityPhasicitySpontaneityPropertiesThrombus Aging +---------+---------------+---------+-----------+----------+--------------+ CFV      Full           Yes      Yes                                 +---------+---------------+---------+-----------+----------+--------------+ SFJ      Full                                                        +---------+---------------+---------+-----------+----------+--------------+ FV Prox  Full           Yes      Yes                                 +---------+---------------+---------+-----------+----------+--------------+  FV Mid   Full           Yes      Yes                                 +---------+---------------+---------+-----------+----------+--------------+ FV DistalFull           Yes      Yes                                 +---------+---------------+---------+-----------+----------+--------------+ PFV      Full                                                        +---------+---------------+---------+-----------+----------+--------------+ POP      Full           Yes      Yes                                 +---------+---------------+---------+-----------+----------+--------------+ PTV      Full                                                        +---------+---------------+---------+-----------+----------+--------------+ PERO     Full                                                         +---------+---------------+---------+-----------+----------+--------------+   +---------+---------------+---------+-----------+----------+--------------+ LEFT     CompressibilityPhasicitySpontaneityPropertiesThrombus Aging +---------+---------------+---------+-----------+----------+--------------+ CFV      Full           Yes      Yes                                 +---------+---------------+---------+-----------+----------+--------------+ SFJ      Full                                                        +---------+---------------+---------+-----------+----------+--------------+ FV Prox  Full           Yes      Yes                                 +---------+---------------+---------+-----------+----------+--------------+ FV Mid   Full           Yes      Yes                                 +---------+---------------+---------+-----------+----------+--------------+ FV DistalFull  Yes      Yes                                 +---------+---------------+---------+-----------+----------+--------------+ PFV      Full                                                        +---------+---------------+---------+-----------+----------+--------------+ POP      Full           Yes      Yes                                 +---------+---------------+---------+-----------+----------+--------------+ PTV      Full                                                        +---------+---------------+---------+-----------+----------+--------------+ PERO     Full                                                        +---------+---------------+---------+-----------+----------+--------------+     Summary: BILATERAL: - No evidence of deep vein thrombosis seen in the lower extremities, bilaterally. - No evidence of superficial venous thrombosis in the lower extremities, bilaterally. -No evidence of popliteal cyst, bilaterally. -Significant subcutaneous edema found  throughout lower extremities.  *See table(s) above for measurements and observations. Electronically signed by Monica Martinez MD on 03/05/2021 at 3:29:23 PM.    Final     Microbiology: No results found for this or any previous visit (from the past 240 hour(s)).   Labs: CBC: Recent Labs  Lab 03/13/21 1635 03/14/21 0437 03/15/21 0809  WBC 4.5 4.7 5.2  NEUTROABS  --  2.6 3.4  HGB 9.4* 8.1* 8.2*  HCT 30.3* 26.1* 26.5*  MCV 86.6 87.3 87.2  PLT 286 240 161   Basic Metabolic Panel: Recent Labs  Lab 03/13/21 0413 03/14/21 0437 03/15/21 0809  NA 141 141 140  K 3.5 3.7 3.9  CL 103 104 102  CO2 35* 31 33*  GLUCOSE 106* 95 117*  BUN 29* 27* 27*  CREATININE 1.67* 1.47* 1.60*  CALCIUM 7.9* 7.9* 8.1*   Liver Function Tests: Recent Labs  Lab 03/15/21 0809  AST 24  ALT 17  ALKPHOS 73  BILITOT 0.4  PROT 5.9*  ALBUMIN 2.3*   CBG: No results for input(s): GLUCAP in the last 168 hours.  Time spent: 35 minutes  Signed:  Berle Mull  [facility] 03/15/2021 5:12 PM

## 2021-03-20 LAB — CBC
Hematocrit: 28.1 % — ABNORMAL LOW (ref 34.0–46.6)
Hemoglobin: 9 g/dL — ABNORMAL LOW (ref 11.1–15.9)
MCH: 26.5 pg — ABNORMAL LOW (ref 26.6–33.0)
MCHC: 32 g/dL (ref 31.5–35.7)
MCV: 83 fL (ref 79–97)
Platelets: 263 10*3/uL (ref 150–450)
RBC: 3.39 x10E6/uL — ABNORMAL LOW (ref 3.77–5.28)
RDW: 14.1 % (ref 11.7–15.4)
WBC: 5.4 10*3/uL (ref 3.4–10.8)

## 2021-03-20 LAB — BASIC METABOLIC PANEL
BUN/Creatinine Ratio: 25 (ref 12–28)
BUN: 32 mg/dL — ABNORMAL HIGH (ref 8–27)
CO2: 29 mmol/L (ref 20–29)
Calcium: 8.9 mg/dL (ref 8.7–10.3)
Chloride: 103 mmol/L (ref 96–106)
Creatinine, Ser: 1.26 mg/dL — ABNORMAL HIGH (ref 0.57–1.00)
Glucose: 107 mg/dL — ABNORMAL HIGH (ref 65–99)
Potassium: 4.3 mmol/L (ref 3.5–5.2)
Sodium: 143 mmol/L (ref 134–144)
eGFR: 47 mL/min/{1.73_m2} — ABNORMAL LOW (ref >59)

## 2021-03-20 LAB — MAGNESIUM: Magnesium: 2.1 mg/dL (ref 1.6–2.3)

## 2021-03-22 ENCOUNTER — Telehealth: Payer: Self-pay | Admitting: Cardiology

## 2021-03-22 NOTE — Telephone Encounter (Signed)
Left message for patient to call and discuss upcoming appointment for the Cardiac MRI ordered by Dr. Gardiner Rhyme

## 2021-03-23 ENCOUNTER — Encounter: Payer: Self-pay | Admitting: Cardiology

## 2021-03-23 ENCOUNTER — Encounter (HOSPITAL_COMMUNITY): Payer: Self-pay

## 2021-03-23 NOTE — Telephone Encounter (Signed)
Left message for patient regarding the Monday 04/30/21 2:00 pm Cardiac MRI appointment at [facility]---arrival time is 1:30 pm--1st floor admissions office for check in.  Will mail information to patient and requested she call with questions or concerns.

## 2021-03-26 ENCOUNTER — Other Ambulatory Visit: Payer: Self-pay | Admitting: *Deleted

## 2021-03-26 DIAGNOSIS — Z79899 Other long term (current) drug therapy: Secondary | ICD-10-CM

## 2021-03-26 DIAGNOSIS — I5042 Chronic combined systolic (congestive) and diastolic (congestive) heart failure: Secondary | ICD-10-CM

## 2021-03-26 MED ORDER — LOSARTAN POTASSIUM 25 MG PO TABS: 25.0000 mg | ORAL_TABLET | Freq: Every day | ORAL | 3 refills | Status: DC

## 2021-04-03 LAB — BASIC METABOLIC PANEL
BUN/Creatinine Ratio: 27 (ref 12–28)
BUN: 32 mg/dL — ABNORMAL HIGH (ref 8–27)
CO2: 25 mmol/L (ref 20–29)
Calcium: 8.5 mg/dL — ABNORMAL LOW (ref 8.7–10.3)
Chloride: 108 mmol/L — ABNORMAL HIGH (ref 96–106)
Creatinine, Ser: 1.17 mg/dL — ABNORMAL HIGH (ref 0.57–1.00)
Glucose: 177 mg/dL — ABNORMAL HIGH (ref 65–99)
Potassium: 4.7 mmol/L (ref 3.5–5.2)
Sodium: 142 mmol/L (ref 134–144)
eGFR: 51 mL/min/{1.73_m2} — ABNORMAL LOW (ref >59)

## 2021-04-04 ENCOUNTER — Telehealth: Payer: Self-pay | Admitting: Cardiology

## 2021-04-04 MED ORDER — FUROSEMIDE 40 MG PO TABS: 40.0000 mg | ORAL_TABLET | Freq: Every day | ORAL | 3 refills | Status: DC

## 2021-04-04 MED ORDER — CARVEDILOL 12.5 MG PO TABS: 12.5000 mg | ORAL_TABLET | Freq: Two times a day (BID) | ORAL | 3 refills | Status: DC

## 2021-04-04 MED ORDER — SPIRONOLACTONE 25 MG PO TABS: 12.5000 mg | ORAL_TABLET | Freq: Every day | ORAL | 3 refills | Status: DC

## 2021-04-04 NOTE — Telephone Encounter (Signed)
Pt called in returning [facility] call for results... please advise.

## 2021-04-04 NOTE — Telephone Encounter (Signed)
Returned call to patient-patient aware of results and verbalized understanding.    Request refills, sent to pharmacy.

## 2021-04-23 ENCOUNTER — Ambulatory Visit: Payer: Medicare Other | Admitting: Family

## 2021-04-24 ENCOUNTER — Encounter: Payer: Self-pay | Admitting: Family

## 2021-04-24 ENCOUNTER — Ambulatory Visit (INDEPENDENT_AMBULATORY_CARE_PROVIDER_SITE_OTHER): Payer: Medicare Other | Admitting: Family

## 2021-04-24 ENCOUNTER — Other Ambulatory Visit: Payer: Self-pay

## 2021-04-24 VITALS — BP 130/88 | HR 60 | Temp 97.3°F | Resp 16 | Ht 65.0 in | Wt 175.2 lb

## 2021-04-24 DIAGNOSIS — Z1231 Encounter for screening mammogram for malignant neoplasm of breast: Secondary | ICD-10-CM

## 2021-04-24 DIAGNOSIS — Z780 Asymptomatic menopausal state: Secondary | ICD-10-CM

## 2021-04-24 DIAGNOSIS — Z1159 Encounter for screening for other viral diseases: Secondary | ICD-10-CM

## 2021-04-24 DIAGNOSIS — Z1211 Encounter for screening for malignant neoplasm of colon: Secondary | ICD-10-CM

## 2021-04-24 DIAGNOSIS — I10 Essential (primary) hypertension: Secondary | ICD-10-CM | POA: Diagnosis not present

## 2021-04-24 DIAGNOSIS — F411 Generalized anxiety disorder: Secondary | ICD-10-CM | POA: Diagnosis not present

## 2021-04-24 DIAGNOSIS — I5042 Chronic combined systolic (congestive) and diastolic (congestive) heart failure: Secondary | ICD-10-CM | POA: Diagnosis not present

## 2021-04-24 DIAGNOSIS — Z23 Encounter for immunization: Secondary | ICD-10-CM

## 2021-04-24 MED ORDER — TETANUS-DIPHTH-ACELL PERTUSSIS 5-2.5-18.5 LF-MCG/0.5 IM SUSP: 0.5000 mL | Freq: Once | INTRAMUSCULAR | 0 refills | Status: AC

## 2021-04-24 NOTE — Patient Instructions (Signed)
-   please get your shingrix,Tetanus shot and Covid -19 vaccine at your pharmacy   - referral ordered today Gastroenterologist specialist office will call you for appointment   - breast Center imaging will call you for appointment for mammogram and bone density

## 2021-04-24 NOTE — Progress Notes (Signed)
Provider: Marlowe Sax FNP-C   Jasamine Pottinger, [name], NP  Patient Care Team: Rutherford Alarie, [name], NP as PCP - General (Family Medicine) Donato Heinz, MD as PCP - Cardiology (Cardiology)  Extended Emergency Contact Information Primary Emergency Contact: [facility] Phone: [phone] Relation: Daughter  Code Status:  Full Code  Goals of care: Advanced Directive information Advanced Directives 04/24/2021  Does Patient Have a Medical Advance Directive? No  Would patient like information on creating a medical advance directive? No - Patient declined     Chief Complaint  Patient presents with   Establish Care    New Patient.     HPI:  Pt is a 70 y.o. 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 States feeling much better without any shortness of breath. States scheduled for a cardiac MRI on Monday 04/30/2021 . Losartan 25 mg tablet daily was added for her blood pressure. denies any headache,dizziness,vision changes,fatigue,chest tightness,palpitation,chest pain or shortness of breath.     Generalized anxiety/Depression - states was worried coming for new visit today but  calmed down after meeting provider.Daughter states has been depressed since patient 's daughter died one year ago.she was started on antidepressant but was discontinued unclear why.Does not recall which medication she was started on.she declines to start on any antidepressant.   She due for Tetanus vaccine,shingrix and COVID-19 vaccine.discussed to get vaccine at her pharmacy. PNA vac also due but states will not take PNA vac would like it discontinued.  Also due for mammogram,Dexa scan and colonoscopy.      Past Medical History:  Diagnosis Date   Chronic combined systolic and diastolic CHF (congestive heart failure) (HCC)    Generalized anxiety disorder    History of MRI    Hypertension    Past Surgical History:  Procedure Laterality Date   CATARACT EXTRACTION, BILATERAL     combined systolic and diastolic congestive heart failure       No Known Allergies  Allergies as of 04/24/2021   No Known Allergies      Medication List        Accurate as of April 24, 2021 10:00 PM. If you have any questions, ask your nurse or doctor.          carvedilol 12.5 MG tablet Commonly known as: COREG Take 1 tablet (12.5 mg total) by mouth 2 (two) times daily with a meal.   furosemide 40 MG tablet Commonly known as: LASIX Take 1 tablet (40 mg total) by mouth daily.   losartan 25 MG tablet Commonly known as: COZAAR Take 25 mg by mouth daily.   spironolactone 25 MG tablet Commonly known as: ALDACTONE Take 0.5 tablets (12.5 mg total) by mouth daily.   Tdap 5-2.5-18.5 LF-MCG/0.5 injection Commonly known as: BOOSTRIX Inject 0.5 mLs into the muscle once for 1 dose.  Review of Systems  Constitutional:  Negative for appetite change, chills, fatigue, fever and unexpected weight change.  HENT:  Negative for congestion, dental problem, ear discharge, ear pain, facial swelling, hearing loss, nosebleeds, postnasal drip, rhinorrhea, sinus pressure, sinus pain, sneezing, sore  throat, tinnitus and trouble swallowing.   Eyes:  Negative for pain, discharge, redness, itching and visual disturbance.       Has had cataract removed both eye vision good  Respiratory:  Negative for cough, chest tightness, shortness of breath and wheezing.   Cardiovascular:  Positive for leg swelling. Negative for chest pain and palpitations.       Previously in hospital for palpitation but med have been effective. MRI heart 04/30/2021   Gastrointestinal:  Negative for abdominal distention, abdominal pain, blood in stool, constipation, nausea and vomiting.       Diarrhea after taking medication " described as loose"   Endocrine: Negative for cold intolerance, heat intolerance, polydipsia, polyphagia and polyuria.  Genitourinary:  Negative for difficulty urinating, dysuria, flank pain, frequency and urgency.  Musculoskeletal:  Positive for arthralgias and gait problem. Negative for back pain, joint swelling, myalgias, neck pain and neck stiffness.       Pain on the legs   Skin:  Negative for color change, pallor, rash and wound.  Neurological:  Negative for dizziness, syncope, speech difficulty, weakness, light-headedness, numbness and headaches.  Hematological:  Does not bruise/bleed easily.  Psychiatric/Behavioral:  Negative for agitation, behavioral problems, confusion, hallucinations, self-injury, sleep disturbance and suicidal ideas. The patient is nervous/anxious.        Depressed lost her daughter one year ago.    Immunization History  Administered Date(s) Administered   PFIZER(Purple Top)SARS-COV-2 Vaccination 12/16/2019, 01/12/2020   Pertinent  Health Maintenance Due  Topic Date Due   COLONOSCOPY (Pts 45-19yr Insurance coverage will need to be confirmed)  Never done   MAMMOGRAM  Never done   DEXA SCAN  Never done   INFLUENZA VACCINE  05/14/2021   PNA vac Low Risk Adult  Discontinued   Fall Risk  04/24/2021  Falls in the past year? 0  Number falls in past yr: 0  Injury with  Fall? 0  Risk for fall due to : No Fall Risks  Follow up Falls evaluation completed   Functional Status Survey:    Vitals:   04/24/21 1337 04/24/21 1441  BP: (!) 180/90 130/88  Pulse: 60   Resp: 16   Temp: (!) 97.3 F (36.3 C)   SpO2: 99%   Weight: 175 lb 3.2 oz (79.5 kg)   Height: _0  (1.651 m)    Body mass index is 29.15 kg/m. Physical Exam Vitals reviewed.  Constitutional:      General: She is not in acute distress.    Appearance: Normal appearance. She is overweight. She is not ill-appearing or diaphoretic.  HENT:     Head: Normocephalic.     Right Ear: Tympanic membrane, ear canal and external ear normal. There is no impacted cerumen.     Left Ear: Tympanic membrane, ear canal and external ear normal. There is no impacted cerumen.     Nose: Nose normal. No congestion or rhinorrhea.     Mouth/Throat:     Mouth: Mucous membranes are moist.     Pharynx: Oropharynx is clear. No oropharyngeal exudate or posterior oropharyngeal erythema.  Eyes:     General: No scleral icterus.       Right eye: No discharge.        Left  eye: No discharge.     Extraocular Movements: Extraocular movements intact.     Conjunctiva/sclera: Conjunctivae normal.     Pupils: Pupils are equal, round, and reactive to light.  Neck:     Vascular: No carotid bruit.  Cardiovascular:     Rate and Rhythm: Normal rate and regular rhythm.     Pulses: Normal pulses.     Heart sounds: Normal heart sounds. No murmur heard.   No friction rub. No gallop.  Pulmonary:     Effort: Pulmonary effort is normal. No respiratory distress.     Breath sounds: Normal breath sounds. No wheezing, rhonchi or rales.  Chest:     Chest wall: No tenderness.  Abdominal:     General: Bowel sounds are normal. There is no distension.     Palpations: Abdomen is soft. There is no mass.     Tenderness: There is no abdominal tenderness. There is no right CVA tenderness, left CVA tenderness, guarding or rebound.   Musculoskeletal:        General: No swelling or tenderness.     Cervical back: Normal range of motion. No rigidity or tenderness.     Right lower leg: Edema present.     Left lower leg: Edema present.     Comments: Unsteady gait ambulates with a cane  Bilateral lower extremities edema   Lymphadenopathy:     Cervical: No cervical adenopathy.  Skin:    General: Skin is warm and dry.     Coloration: Skin is not pale.     Findings: No bruising, erythema, lesion or rash.  Neurological:     Mental Status: She is alert and oriented to person, place, and time.     Cranial Nerves: No cranial nerve deficit.     Sensory: No sensory deficit.     Motor: No weakness.     Coordination: Coordination normal.     Gait: Gait abnormal.  Psychiatric:        Mood and Affect: Mood is anxious and depressed.        Speech: Speech normal.        Behavior: Behavior normal.        Thought Content: Thought content normal.        Judgment: Judgment normal.    Labs reviewed: Recent Labs    03/05/21 0842 03/06/21 0445 03/12/21 0914 03/13/21 0413 03/15/21 0809 03/19/21 1546 04/02/21 1544  NA 138   < >  --    < > 140 143 142  K 4.0   < >  --    < > 3.9 4.3 4.7  CL 107   < >  --    < > 102 103 108*  CO2 24   < >  --    < > 33* 29 25  GLUCOSE 130*   < >  --    < > 117* 107* 177*  BUN 27*   < >  --    < > 27* 32* 32*  CREATININE 1.35*   < >  --    < > 1.60* 1.26* 1.17*  CALCIUM 8.3*   < >  --    < > 8.1* 8.9 8.5*  MG 2.3  --  2.0  --   --  2.1  --    < > = values in this interval not displayed.   Recent Labs    03/06/21 0445 03/10/21 0413 03/15/21 0809  AST _0 ALT 14 13  17  ALKPHOS 82 75 73  BILITOT 0.7 0.6 0.4  PROT 5.8* 5.6* 5.9*  ALBUMIN 2.5* 2.1* 2.3*   Recent Labs    03/10/21 0413 03/13/21 1635 03/14/21 0437 03/15/21 0809 03/19/21 1546  WBC 5.0   < > 4.7 5.2 5.4  NEUTROABS 3.2  --  2.6 3.4  --   HGB 8.5*   < > 8.1* 8.2* 9.0*  HCT 26.7*   < > 26.1* 26.5* 28.1*  MCV  84.2   < > 87.3 87.2 83  PLT 260   < > 240 256 263   < > = values in this interval not displayed.   No results found for: TSH No results found for: HGBA1C No results found for: CHOL, HDL, LDLCALC, LDLDIRECT, TRIG, CHOLHDL  Significant Diagnostic Results in last 30 days:  No results found.  Assessment/Plan 1. Essential hypertension B/p elevated on arrival but was very anxious.B/p rechecked after resting and calmed down reading improved. - continue on furosemide,Carvedilol,Losartan and spironolactone  - continue to follow up with Cardiologist - CBC with Differential/Platelet; Future - CMP with eGFR([facility]); Future - Lipid panel; Future - TSH; Future  2. Chronic combined systolic and diastolic heart failure (HCC) Metastatic CHF as above. Has upcoming cardiac MRI 04/30/2021  Continue on furosemide,Carvedilol,Losartan and spironolactone  - chronic lymphedema - continue to follow up with Cardiologist. No shortness of breath or cough noted.   3. Generalized anxiety disorder Very anxious on arrival but decline any anxiety medication.ongoing since daughter died one year ago. - TSH; Future  4. Colon cancer screening Asymptomatic will refer to GI for screening colonoscopy  Made aware specialist office will call for appointment  - Ambulatory referral to Gastroenterology  5. Need for Tdap vaccination Script send to pharmacy  Advised to get Tdap vaccine at her pharmacy  - Tdap ([place]) 5-2.5-18.5 LF-MCG/0.5 injection; Inject 0.5 mLs into the muscle once for 1 dose.  Dispense: 0.5 mL; Refill: 0  6. Breast cancer screening by mammogram Reports no symptoms  Made aware GI breast Center will call her to make appointment.  - MM DIGITAL SCREENING BILATERAL; Future  7. Postmenopausal estrogen deficiency No previous bone density for review. Made aware GI breast Center will call for appointment.  - DG Bone Density; Future  Family/ staff Communication: Reviewed plan of care with patient  and daughter verbalized understanding   Labs/tests ordered:  - CBC with Differential/Platelet - CMP with eGFR([facility]) - TSH - Lipid panel - Hep C Antibody  Next Appointment : 6 months for medical management of chronic issues.Fasting Labs in 1 week or sooner.    [name], NP

## 2021-04-27 ENCOUNTER — Telehealth (HOSPITAL_COMMUNITY): Payer: Self-pay | Admitting: Emergency Medicine

## 2021-04-27 NOTE — Telephone Encounter (Signed)
Reaching out to patient to offer assistance regarding upcoming cardiac imaging study; pt verbalizes understanding of appt date/time, parking situation and where to check in, and verified current allergies; name and call back number provided for further questions should they arise Marchia Bond RN Navigator Cardiac Imaging [facility] [phone] office [phone] cell  Denies metal implants Denies iv issues Reports some claustro/anxiety  Holding diuretics

## 2021-04-30 ENCOUNTER — Other Ambulatory Visit: Payer: Self-pay

## 2021-04-30 ENCOUNTER — Ambulatory Visit (HOSPITAL_COMMUNITY)
Admission: RE | Admit: 2021-04-30 | Discharge: 2021-04-30 | Disposition: A | Discharge status: Home / Self Care | Payer: Medicare Other | Source: Ambulatory Visit | Attending: Cardiology | Admitting: Cardiology

## 2021-04-30 ENCOUNTER — Other Ambulatory Visit: Payer: Medicare Other

## 2021-04-30 DIAGNOSIS — I5042 Chronic combined systolic (congestive) and diastolic (congestive) heart failure: Secondary | ICD-10-CM

## 2021-04-30 DIAGNOSIS — I10 Essential (primary) hypertension: Secondary | ICD-10-CM

## 2021-04-30 DIAGNOSIS — F411 Generalized anxiety disorder: Secondary | ICD-10-CM

## 2021-04-30 DIAGNOSIS — Z1159 Encounter for screening for other viral diseases: Secondary | ICD-10-CM

## 2021-04-30 MED ORDER — GADOBUTROL 1 MMOL/ML IV SOLN
8.0000 mL | Freq: Once | INTRAVENOUS | Status: AC | PRN
  Administered 2021-04-30: 8 mL via INTRAVENOUS

## 2021-05-01 LAB — HEPATITIS C ANTIBODY
Hepatitis C Ab: NONREACTIVE
SIGNAL TO CUT-OFF: 0.04 (ref <1.00)

## 2021-05-01 LAB — CBC WITH DIFFERENTIAL/PLATELET
Absolute Monocytes: 346 cells/uL (ref 200–950)
Basophils Absolute: 22 cells/uL (ref 0–200)
Basophils Relative: 0.6 %
Eosinophils Absolute: 79 cells/uL (ref 15–500)
Eosinophils Relative: 2.2 %
HCT: 29.1 % — ABNORMAL LOW (ref 35.0–45.0)
Hemoglobin: 9.1 g/dL — ABNORMAL LOW (ref 11.7–15.5)
Lymphs Abs: 1202 cells/uL (ref 850–3900)
MCH: 26.5 pg — ABNORMAL LOW (ref 27.0–33.0)
MCHC: 31.3 g/dL — ABNORMAL LOW (ref 32.0–36.0)
MCV: 84.8 fL (ref 80.0–100.0)
MPV: 10.4 fL (ref 7.5–12.5)
Monocytes Relative: 9.6 %
Neutro Abs: 1951 cells/uL (ref 1500–7800)
Neutrophils Relative %: 54.2 %
Platelets: 245 10*3/uL (ref 140–400)
RBC: 3.43 10*6/uL — ABNORMAL LOW (ref 3.80–5.10)
RDW: 14.3 % (ref 11.0–15.0)
Total Lymphocyte: 33.4 %
WBC: 3.6 10*3/uL — ABNORMAL LOW (ref 3.8–10.8)

## 2021-05-01 LAB — LIPID PANEL
Cholesterol: 216 mg/dL — ABNORMAL HIGH (ref <200)
HDL: 72 mg/dL (ref >=50)
LDL Cholesterol (Calc): 125 mg/dL (calc) — ABNORMAL HIGH
Non-HDL Cholesterol (Calc): 144 mg/dL (calc) — ABNORMAL HIGH (ref <130)
Total CHOL/HDL Ratio: 3 (calc) (ref <5.0)
Triglycerides: 89 mg/dL (ref <150)

## 2021-05-01 LAB — COMPLETE METABOLIC PANEL WITH GFR
AG Ratio: 0.9 (calc) — ABNORMAL LOW (ref 1.0–2.5)
ALT: 11 U/L (ref 6–29)
AST: 14 U/L (ref 10–35)
Albumin: 3.1 g/dL — ABNORMAL LOW (ref 3.6–5.1)
Alkaline phosphatase (APISO): 111 U/L (ref 37–153)
BUN/Creatinine Ratio: 27 (calc) — ABNORMAL HIGH (ref 6–22)
BUN: 38 mg/dL — ABNORMAL HIGH (ref 7–25)
CO2: 26 mmol/L (ref 20–32)
Calcium: 8.3 mg/dL — ABNORMAL LOW (ref 8.6–10.4)
Chloride: 108 mmol/L (ref 98–110)
Creat: 1.41 mg/dL — ABNORMAL HIGH (ref 0.50–1.05)
Globulin: 3.4 g/dL (calc) (ref 1.9–3.7)
Glucose, Bld: 95 mg/dL (ref 65–99)
Potassium: 4.5 mmol/L (ref 3.5–5.3)
Sodium: 141 mmol/L (ref 135–146)
Total Bilirubin: 0.4 mg/dL (ref 0.2–1.2)
Total Protein: 6.5 g/dL (ref 6.1–8.1)
eGFR: 41 mL/min/{1.73_m2} — ABNORMAL LOW (ref >=60)

## 2021-05-01 LAB — TSH: TSH: 1.54 mIU/L (ref 0.40–4.50)

## 2021-05-03 ENCOUNTER — Other Ambulatory Visit: Payer: Self-pay

## 2021-05-03 DIAGNOSIS — D649 Anemia, unspecified: Secondary | ICD-10-CM

## 2021-05-03 DIAGNOSIS — K5900 Constipation, unspecified: Secondary | ICD-10-CM

## 2021-05-03 DIAGNOSIS — I10 Essential (primary) hypertension: Secondary | ICD-10-CM

## 2021-05-03 MED ORDER — IRON (FERROUS SULFATE) 325 (65 FE) MG PO TABS: 325.0000 mg | ORAL_TABLET | Freq: Once | ORAL | 0 refills | Status: DC

## 2021-05-03 MED ORDER — DOCUSATE SODIUM 100 MG PO CAPS: 100.0000 mg | ORAL_CAPSULE | Freq: Every day | ORAL | 0 refills | Status: DC

## 2021-05-03 NOTE — Progress Notes (Signed)
Cardiology Office Note:    Date:  05/13/2021   ID:  Kara Mullins, DOB [birth year], MRN [id number]  PCP:  [name], NP  Cardiologist:  Donato Heinz, MD  Electrophysiologist:  None   Referring MD: No ref. provider found   No chief complaint on file.   History of Present Illness:    Kara Mullins is a 70 y.o. female with a hx of chronic combined systolic metastatic heart failure, hypertension, lymphedema who presents for hospital follow-up.  She was admitted to [facility] on 03/05/2021 with shortness of breath.  Echocardiogram showed LVEF 45%, severe LVH, concerning for cardiac amyloid.  Work-up for amyloidosis showed abnormal light chains, hematology was consulted underwent bone marrow biopsy.  Course is complicated by AKI thought to be secondary to contrast nephropathy.  Nephrology was consulted and she was diuresed with IV Lasix with improvement in her kidney function.  She was discharged on oral Lasix 40 mg daily.  Cardiac MRI on 04/30/2021 showed no evidence of cardiac amyloidosis, moderate asymmetric LVH not meeting HCM criteria, LVEF 53%, RVEF 57%, no LGE.  Since last clinic visit, she is doing well. She currently is taking the Furosemide 40 MG. She experiences shortness of breath when feeling hot and LE edema at the end of the day, otherwise denies any complaints. Denies any chest pains, lightheadedness, syncope, or palpitations.   Wt Readings from Last 3 Encounters:  05/07/21 166 lb (75.3 kg)  04/24/21 175 lb 3.2 oz (79.5 kg)  03/19/21 154 lb 12.8 oz (70.2 kg)      Past Medical History:  Diagnosis Date   Chronic combined systolic and diastolic CHF (congestive heart failure) (HCC)    Generalized anxiety disorder    History of MRI    Hypertension     Past Surgical History:  Procedure Laterality Date   CATARACT EXTRACTION, BILATERAL     combined systolic and diastolic congestive heart failure       Current Medications: Current Meds  Medication Sig    carvedilol (COREG) 12.5 MG tablet Take 1 tablet (12.5 mg total) by mouth 2 (two) times daily with a meal.   furosemide (LASIX) 40 MG tablet Take 1 tablet (40 mg total) by mouth daily.   losartan (COZAAR) 25 MG tablet Take 25 mg by mouth daily.   rosuvastatin (CRESTOR) 10 MG tablet Take 1 tablet (10 mg total) by mouth daily.   spironolactone (ALDACTONE) 25 MG tablet Take 0.5 tablets (12.5 mg total) by mouth daily.     Allergies:   Patient has no known allergies.   Social History   Socioeconomic History   Marital status: Divorced    Spouse name: Not on file   Number of children: Not on file   Years of education: Not on file   Highest education level: Not on file  Occupational History   Not on file  Tobacco Use   Smoking status: Never   Smokeless tobacco: Never  Vaping Use   Vaping Use: Never used  Substance and Sexual Activity   Alcohol use: Not Currently   Drug use: Never   Sexual activity: Not on file  Other Topics Concern   Not on file  Social History Narrative   Tobacco use, amount per day now: N/A   Past tobacco use, amount per day: N/A   How many years did you use tobacco: N/A   Alcohol use (drinks per week): N/A   Diet:   Do you drink/eat things with caffeine: Coffee  Marital status: Divorced                                 What year were you married? 1992   Do you live in a house, apartment, assisted living, condo, trailer, etc.?   Is it one or more stories?   How many persons live in your home? 2   Do you have pets in your home?( please list) No   Highest Level of education completed? High School   Current or past profession: Restaurant Employee   Do you exercise?   Yes                               Type and how often? Walk   Do you have a living will? No   Do you have a DNR form?    No                               If not, do you want to discuss one?   Do you have signed POA/HPOA forms? No                       If so, please bring to you appointment       Do you have any difficulty bathing or dressing yourself? No   Do you have any difficulty preparing food or eating? No   Do you have any difficulty managing your medications? No   Do you have any difficulty managing your finances? No   Do you have any difficulty affording your medications? No   Social Determinants of Radio broadcast assistant Strain: Not on file  Food Insecurity: Not on file  Transportation Needs: Not on file  Physical Activity: Not on file  Stress: Not on file  Social Connections: Not on file     Family History: The patient's family history includes Throat cancer in her father.  ROS:   Please see the history of present illness.    (+) LE edema (+) shortness of breath  All other systems reviewed and are negative.  EKGs/Labs/Other Studies Reviewed:    The following studies were reviewed today: Cardiac morphology 07/22:  IMPRESSION:  1.  No evidence of cardiac amyloidosis   2. Asymmetric LV hypertrophy measuring up to 33m in basal septum (764min posterior wall), not meeting criteria for hypertrophic cardiomyopathy (<1565m  3.  Mild LV dilatation with low normal systolic function (EF 53%02% 4.  Normal RV size and systolic function (EF 57%54% 5.  No late gadolinium enhancement to suggest myocardial scar  Echo 03/05/2021: 1. Left ventricular ejection fraction, by estimation, is 45%. The left  ventricle has mildly decreased function. The left ventricle demonstrates  global hypokinesis. There is severe left ventricular hypertrophy. Left  ventricular diastolic parameters are  consistent with Grade I diastolic dysfunction (impaired relaxation). Would  consider cardiac amyloidosis.   2. Right ventricular systolic function is normal. The right ventricular  size is normal. There is mildly elevated pulmonary artery systolic  pressure. The estimated right ventricular systolic pressure is 41.27.0Hg.   3. Left atrial size was severely dilated.   4. The mitral  valve is normal in structure. Mild to moderate mitral valve  regurgitation. No evidence of mitral stenosis.  5. The aortic valve is tricuspid. Aortic valve regurgitation is trivial.  No aortic stenosis is present.   6. Aortic dilatation noted. There is mild dilatation of the aortic root,  measuring 37 mm.   7. The inferior vena cava is dilated in size with <50% respiratory  variability, suggesting right atrial pressure of 15 mmHg.   8. There is a left pleural effusion and trivial pericardial effusion.   [place] LE Venous Bilateral 03/05/2021: BILATERAL:  - No evidence of deep vein thrombosis seen in the lower extremities,  bilaterally.  - No evidence of superficial venous thrombosis in the lower extremities,  bilaterally.  -No evidence of popliteal cyst, bilaterally.  -Significant subcutaneous edema found throughout lower extremities.  CT Angio Chest 03/05/2021: IMPRESSION: 1. No pulmonary embolus. 2. Pulmonary edema with trace to small volume left and small to moderate volume right pleural effusions. 3.  Aortic Atherosclerosis (ICD10-I70.0).  EKG:   07/22:no EKG was ordered today. 03/19/2021: NSR, rate 57, nonspecific T wave flattening  Recent Labs: 04/30/2021: ALT 11; Hemoglobin 9.1; Platelets 245; TSH 1.54 05/07/2021: BNP 595.4; BUN 38; Creatinine, Ser 1.30; Magnesium 2.4; Potassium 4.5; Sodium 140  Recent Lipid Panel    Component Value Date/Time   CHOL 216 (H) 04/30/2021 1112   TRIG 89 04/30/2021 1112   HDL 72 04/30/2021 1112   CHOLHDL 3.0 04/30/2021 1112   LDLCALC 125 (H) 04/30/2021 1112    Physical Exam:    VS:  BP 138/88   Pulse 68   Resp 18   Ht _0  (1.651 m)   Wt 166 lb (75.3 kg)   SpO2 99%   BMI 27.62 kg/m     Wt Readings from Last 3 Encounters:  05/07/21 166 lb (75.3 kg)  04/24/21 175 lb 3.2 oz (79.5 kg)  03/19/21 154 lb 12.8 oz (70.2 kg)     GEN: Well nourished, well developed in no acute distress HEENT: Normal NECK: No JVD; No carotid  bruits LYMPHATICS: No lymphadenopathy CARDIAC: RRR, no murmurs, rubs, gallops RESPIRATORY:  Clear to auscultation without rales, wheezing or rhonchi  ABDOMEN: Soft, non-tender, non-distended MUSCULOSKELETAL: Trace edema; Compression stockings in place SKIN: Warm and dry NEUROLOGIC:  Alert and oriented x 3 PSYCHIATRIC:  Normal affect   ASSESSMENT:    1. Chronic combined systolic and diastolic heart failure ([place])   2. Essential hypertension   3. Medication management   4. AKI (acute kidney injury) ([place])   5. Hyperlipidemia, unspecified hyperlipidemia type    PLAN:    Chronic systolic and diastolic heart failure: Diagnosed during recent admission, with EF 45% and severe LVH on echo but relatively low voltage on EKG, concerning for cardiac amyloidosis.  Amyloid work-up showed abnormal light chains and underwent bone marrow biopsy with hematology. -Cardiac MRI on 04/30/2021 showed no evidence of cardiac amyloidosis, moderate asymmetric LVH not meeting HCM criteria, LVEF 53%, RVEF 57%, no LGE. -Continue carvedilol 12.5 mg twice daily -Continue spironolactone 12.5 mg daily -Continue losartan 25 mg daily -Continue Lasix 40 mg daily -Check BMP/mag  AKI: Creatinine 1.3 on admission on 03/04/2021, peaked at 2.0.  Was 1.60 on discharge on 03/15/2021.  Will check BMET  Hypertension: On carvedilol, spironolactone, losartan, and Lasix.  Mildly elevated in clinic today, asked patient to check BP twice daily for next week and call with results  Hyperlipidemia: LDL 125 on 04/30/2021.  10-year ASCVD risk score 13%.  Will start rosuvastatin 10 mg daily  RTC in 3 months    Medication Adjustments/Labs and Tests Ordered:  Current medicines are reviewed at length with the patient today.  Concerns regarding medicines are outlined above.  Orders Placed This Encounter  Procedures   Basic metabolic panel   Magnesium   Brain natriuretic peptide   Meds ordered this encounter  Medications   rosuvastatin  (CRESTOR) 10 MG tablet    Sig: Take 1 tablet (10 mg total) by mouth daily.    Dispense:  90 tablet    Refill:  3    Patient Instructions  Medication Instructions:  START rosuvastatin (Crestor) 10 mg once daily  *If you need a refill on your cardiac medications before your next appointment, please call your pharmacy*   Lab Work: BMET, Mag, BNP today  If you have labs (blood work) drawn today and your tests are completely normal, you will receive your results only by: [place] (if you have MyChart) OR A paper copy in the mail If you have any lab test that is abnormal or we need to change your treatment, we will call you to review the results.  Follow-Up: At [facility], you and your health needs are our priority.  As part of our continuing mission to provide you with exceptional heart care, we have created designated Provider Care Teams.  These Care Teams include your primary Cardiologist (physician) and Advanced Practice Providers (APPs -  Physician Assistants and Nurse Practitioners) who all work together to provide you with the care you need, when you need it.  We recommend signing up for the patient portal called "MyChart".  Sign up information is provided on this After Visit Summary.  MyChart is used to connect with patients for Virtual Visits (Telemedicine).  Patients are able to view lab/test results, encounter notes, upcoming appointments, etc.  Non-urgent messages can be sent to your provider as well.   To learn more about what you can do with MyChart, go to [email or web address].    Your next appointment:   3 month(s)  The format for your next appointment:   In Person  Provider:   You will see one of the following Advanced Practice Providers on your designated Care Team:   Rosaria Ferries, [name] Caron Presume, [name] Jory Sims, DNP, ANP  Then, Donato Heinz, MD will plan to see you again in 6 month(s).     Please check your blood  pressure at home twice daily, write it down.  Call the office or send message via Mychart with the readings in 1-2 weeks for Dr. Gardiner Rhyme to review.     I,Jada Bradford,acting as a Education administrator for Donato Heinz, MD.,have documented all relevant documentation on the behalf of Donato Heinz, MD,as directed by  Donato Heinz, MD while in the presence of Donato Heinz, MD.  I, Donato Heinz, MD, have reviewed all documentation for this visit. The documentation on 05/13/21 for the exam, diagnosis, procedures, and orders are all accurate and complete.    Signed, Donato Heinz, MD  05/13/2021 1:55 PM    [facility]

## 2021-05-07 ENCOUNTER — Other Ambulatory Visit: Payer: Self-pay

## 2021-05-07 ENCOUNTER — Ambulatory Visit (INDEPENDENT_AMBULATORY_CARE_PROVIDER_SITE_OTHER): Payer: Medicare Other | Admitting: Cardiology

## 2021-05-07 ENCOUNTER — Encounter: Payer: Self-pay | Admitting: Cardiology

## 2021-05-07 VITALS — BP 138/88 | HR 68 | Resp 18 | Ht 65.0 in | Wt 166.0 lb

## 2021-05-07 DIAGNOSIS — I10 Essential (primary) hypertension: Secondary | ICD-10-CM

## 2021-05-07 DIAGNOSIS — Z79899 Other long term (current) drug therapy: Secondary | ICD-10-CM

## 2021-05-07 DIAGNOSIS — N179 Acute kidney failure, unspecified: Secondary | ICD-10-CM

## 2021-05-07 DIAGNOSIS — I5042 Chronic combined systolic (congestive) and diastolic (congestive) heart failure: Secondary | ICD-10-CM | POA: Diagnosis not present

## 2021-05-07 DIAGNOSIS — E785 Hyperlipidemia, unspecified: Secondary | ICD-10-CM

## 2021-05-07 MED ORDER — ROSUVASTATIN CALCIUM 10 MG PO TABS: 10.0000 mg | ORAL_TABLET | Freq: Every day | ORAL | 3 refills | Status: DC

## 2021-05-07 NOTE — Patient Instructions (Addendum)
Medication Instructions:  START rosuvastatin (Crestor) 10 mg once daily  *If you need a refill on your cardiac medications before your next appointment, please call your pharmacy*   Lab Work: BMET, Mag, BNP today  If you have labs (blood work) drawn today and your tests are completely normal, you will receive your results only by: [place] (if you have MyChart) OR A paper copy in the mail If you have any lab test that is abnormal or we need to change your treatment, we will call you to review the results.  Follow-Up: At [facility], you and your health needs are our priority.  As part of our continuing mission to provide you with exceptional heart care, we have created designated Provider Care Teams.  These Care Teams include your primary Cardiologist (physician) and Advanced Practice Providers (APPs -  Physician Assistants and Nurse Practitioners) who all work together to provide you with the care you need, when you need it.  We recommend signing up for the patient portal called "MyChart".  Sign up information is provided on this After Visit Summary.  MyChart is used to connect with patients for Virtual Visits (Telemedicine).  Patients are able to view lab/test results, encounter notes, upcoming appointments, etc.  Non-urgent messages can be sent to your provider as well.   To learn more about what you can do with MyChart, go to [email or web address].    Your next appointment:   3 month(s)  The format for your next appointment:   In Person  Provider:   You will see one of the following Advanced Practice Providers on your designated Care Team:   Rosaria Ferries, [name] Caron Presume, [name] Jory Sims, DNP, ANP  Then, Donato Heinz, MD will plan to see you again in 6 month(s).     Please check your blood pressure at home twice daily, write it down.  Call the office or send message via Mychart with the readings in 1-2 weeks for Dr. Gardiner Rhyme to review.

## 2021-05-08 LAB — BASIC METABOLIC PANEL
BUN/Creatinine Ratio: 29 — ABNORMAL HIGH (ref 12–28)
BUN: 38 mg/dL — ABNORMAL HIGH (ref 8–27)
CO2: 25 mmol/L (ref 20–29)
Calcium: 8.6 mg/dL — ABNORMAL LOW (ref 8.7–10.3)
Chloride: 108 mmol/L — ABNORMAL HIGH (ref 96–106)
Creatinine, Ser: 1.3 mg/dL — ABNORMAL HIGH (ref 0.57–1.00)
Glucose: 112 mg/dL — ABNORMAL HIGH (ref 65–99)
Potassium: 4.5 mmol/L (ref 3.5–5.2)
Sodium: 140 mmol/L (ref 134–144)
eGFR: 45 mL/min/{1.73_m2} — ABNORMAL LOW (ref >59)

## 2021-05-08 LAB — MAGNESIUM: Magnesium: 2.4 mg/dL — ABNORMAL HIGH (ref 1.6–2.3)

## 2021-05-08 LAB — BRAIN NATRIURETIC PEPTIDE: BNP: 595.4 pg/mL — ABNORMAL HIGH (ref 0.0–100.0)

## 2021-08-27 ENCOUNTER — Ambulatory Visit: Payer: Medicare Other | Admitting: Family

## 2021-08-27 ENCOUNTER — Ambulatory Visit (INDEPENDENT_AMBULATORY_CARE_PROVIDER_SITE_OTHER): Payer: Medicare Other | Admitting: Family Medicine

## 2021-08-27 ENCOUNTER — Other Ambulatory Visit: Payer: Medicare Other

## 2021-08-27 ENCOUNTER — Other Ambulatory Visit: Payer: Self-pay

## 2021-08-27 ENCOUNTER — Encounter: Payer: Self-pay | Admitting: Family Medicine

## 2021-08-27 VITALS — BP 134/86 | HR 61 | Temp 98.0°F | Ht 65.0 in | Wt 168.8 lb

## 2021-08-27 DIAGNOSIS — I10 Essential (primary) hypertension: Secondary | ICD-10-CM

## 2021-08-27 DIAGNOSIS — N184 Chronic kidney disease, stage 4 (severe): Secondary | ICD-10-CM | POA: Diagnosis not present

## 2021-08-27 DIAGNOSIS — I5041 Acute combined systolic (congestive) and diastolic (congestive) heart failure: Secondary | ICD-10-CM

## 2021-08-27 DIAGNOSIS — D649 Anemia, unspecified: Secondary | ICD-10-CM

## 2021-08-27 NOTE — Progress Notes (Signed)
Provider:  Alain Honey, MD  Careteam: Patient Care Team: [name], [name], NP as PCP - General (Family Medicine) Donato Heinz, MD as PCP - Cardiology (Cardiology)  PLACE OF SERVICE:  [place] Directive information    No Known Allergies  No chief complaint on file.    HPI: Patient is a 70 y.o. female .  This is a routine follow-up for management of chronic medical problems.  She has seen cardiology, since her last visit here and reports were good at that visit.  She denies any shortness of breath or excessive tiredness related to her congestive failure (and anemia). She was unable to take iron supplements as suggested. Her legs continue to be swollen.  She ambulates with a cane and has not had any falls. Review of her quality metrics shows that she has had COVID immunizations and boosters and shingles.  She intends to follow-up with mammogram and DEXA scan, per her daughter.  Review of Systems:  Review of Systems  Constitutional: Negative.   Cardiovascular:  Positive for orthopnea.  Genitourinary: Negative.   Musculoskeletal: Negative.   All other systems reviewed and are negative.  Past Medical History:  Diagnosis Date   Chronic combined systolic and diastolic CHF (congestive heart failure) (HCC)    Generalized anxiety disorder    History of MRI    Hypertension    Past Surgical History:  Procedure Laterality Date   CATARACT EXTRACTION, BILATERAL     combined systolic and diastolic congestive heart failure      Social History:   reports that she has never smoked. She has never used smokeless tobacco. She reports that she does not currently use alcohol. She reports that she does not use drugs.  Family History  Problem Relation Age of Onset   Throat cancer Father     Medications: Patient's Medications  New Prescriptions   No medications on file  Previous Medications   CARVEDILOL (COREG) 12.5 MG TABLET    Take 1 tablet (12.5 mg  total) by mouth 2 (two) times daily with a meal.   FUROSEMIDE (LASIX) 40 MG TABLET    Take 1 tablet (40 mg total) by mouth daily.   IRON, FERROUS SULFATE, 325 (65 FE) MG TABS    Take 325 mg by mouth once for 1 dose.   LOSARTAN (COZAAR) 25 MG TABLET    Take 25 mg by mouth daily.   ROSUVASTATIN (CRESTOR) 10 MG TABLET    Take 1 tablet (10 mg total) by mouth daily.   SPIRONOLACTONE (ALDACTONE) 25 MG TABLET    Take 0.5 tablets (12.5 mg total) by mouth daily.  Modified Medications   No medications on file  Discontinued Medications   No medications on file    Physical Exam:  Vitals:   08/27/21 0927  Weight: 168 lb 12.8 oz (76.6 kg)  Height: 5\' 5"  (1.651 m)   Body mass index is 28.09 kg/m. Wt Readings from Last 3 Encounters:  08/27/21 168 lb 12.8 oz (76.6 kg)  05/07/21 166 lb (75.3 kg)  04/24/21 175 lb 3.2 oz (79.5 kg)    Physical Exam Vitals and nursing note reviewed.  Constitutional:      Appearance: Normal appearance.  Cardiovascular:     Rate and Rhythm: Normal rate and regular rhythm.  Pulmonary:     Effort: Pulmonary effort is normal.     Breath sounds: Normal breath sounds.  Musculoskeletal:     Right lower leg: Edema present.  Left lower leg: Edema present.     Comments: 5 lower extremities have 4+ swelling.  She is wearing support hose.  Neurological:     Mental Status: She is alert.    Labs reviewed: Basic Metabolic Panel: Recent Labs    03/12/21 0914 03/13/21 0413 03/19/21 1546 04/02/21 1544 04/30/21 1112 05/07/21 1636  NA  --    < > 143 142 141 140  K  --    < > 4.3 4.7 4.5 4.5  CL  --    < > 103 108* 108 108*  CO2  --    < > 29 25 26 25   GLUCOSE  --    < > 107* 177* 95 112*  BUN  --    < > 32* 32* 38* 38*  CREATININE  --    < > 1.26* 1.17* 1.41* 1.30*  CALCIUM  --    < > 8.9 8.5* 8.3* 8.6*  MG 2.0  --  2.1  --   --  2.4*  TSH  --   --   --   --  1.54  --    < > = values in this interval not displayed.   Liver Function Tests: Recent Labs     03/06/21 0445 03/10/21 0413 03/15/21 0809 04/30/21 1112  AST 24 15 24 14   ALT 14 13 17 11   ALKPHOS 82 75 73  --   BILITOT 0.7 0.6 0.4 0.4  PROT 5.8* 5.6* 5.9* 6.5  ALBUMIN 2.5* 2.1* 2.3*  --    Recent Labs    03/04/21 2144  LIPASE 22   No results for input(s): AMMONIA in the last 8760 hours. CBC: Recent Labs    03/14/21 0437 03/15/21 0809 03/19/21 1546 04/30/21 1112  WBC 4.7 5.2 5.4 3.6*  NEUTROABS 2.6 3.4  --  1,951  HGB 8.1* 8.2* 9.0* 9.1*  HCT 26.1* 26.5* 28.1* 29.1*  MCV 87.3 87.2 83 84.8  PLT 240 256 263 245   Lipid Panel: Recent Labs    04/30/21 1112  CHOL 216*  HDL 72  LDLCALC 125*  TRIG 89  CHOLHDL 3.0   TSH: Recent Labs    04/30/21 1112  TSH 1.54   A1C: No results found for: HGBA1C   Assessment/Plan  1. Low hemoglobin Since patient was unable to take iron supplement, doubt we will see much improvement  2. Acute combined systolic and diastolic congestive heart failure (HCC) Continue with furosemide and spironolactone.  Appears compensated at this time  3. Essential hypertension Blood pressure is good at 134/86.  I did suggest taking losartan at bedtime for better early morning blood pressure coverage, and continue carvedilol twice daily   Alain Honey, MD [place] [phone]

## 2021-08-27 NOTE — Patient Instructions (Signed)
We will let you know about your lab results, I remember you can cook in an iron skillet and eat organ meats for iron supplementation

## 2021-08-28 LAB — CBC WITH DIFFERENTIAL/PLATELET
Absolute Monocytes: 314 cells/uL (ref 200–950)
Basophils Absolute: 29 cells/uL (ref 0–200)
Basophils Relative: 0.5 %
Eosinophils Absolute: 80 cells/uL (ref 15–500)
Eosinophils Relative: 1.4 %
HCT: 27 % — ABNORMAL LOW (ref 35.0–45.0)
Hemoglobin: 8.3 g/dL — ABNORMAL LOW (ref 11.7–15.5)
Lymphs Abs: 1414 cells/uL (ref 850–3900)
MCH: 26 pg — ABNORMAL LOW (ref 27.0–33.0)
MCHC: 30.7 g/dL — ABNORMAL LOW (ref 32.0–36.0)
MCV: 84.6 fL (ref 80.0–100.0)
MPV: 11.1 fL (ref 7.5–12.5)
Monocytes Relative: 5.5 %
Neutro Abs: 3865 cells/uL (ref 1500–7800)
Neutrophils Relative %: 67.8 %
Platelets: 247 10*3/uL (ref 140–400)
RBC: 3.19 10*6/uL — ABNORMAL LOW (ref 3.80–5.10)
RDW: 14.9 % (ref 11.0–15.0)
Total Lymphocyte: 24.8 %
WBC: 5.7 10*3/uL (ref 3.8–10.8)

## 2021-08-28 LAB — COMPLETE METABOLIC PANEL WITH GFR
AG Ratio: 0.8 (calc) — ABNORMAL LOW (ref 1.0–2.5)
ALT: 14 U/L (ref 6–29)
AST: 18 U/L (ref 10–35)
Albumin: 2.7 g/dL — ABNORMAL LOW (ref 3.6–5.1)
Alkaline phosphatase (APISO): 132 U/L (ref 37–153)
BUN/Creatinine Ratio: 20 (calc) (ref 6–22)
BUN: 39 mg/dL — ABNORMAL HIGH (ref 7–25)
CO2: 25 mmol/L (ref 20–32)
Calcium: 8.4 mg/dL — ABNORMAL LOW (ref 8.6–10.4)
Chloride: 111 mmol/L — ABNORMAL HIGH (ref 98–110)
Creat: 1.98 mg/dL — ABNORMAL HIGH (ref 0.50–1.05)
Globulin: 3.4 g/dL (calc) (ref 1.9–3.7)
Glucose, Bld: 91 mg/dL (ref 65–99)
Potassium: 4.6 mmol/L (ref 3.5–5.3)
Sodium: 143 mmol/L (ref 135–146)
Total Bilirubin: 0.4 mg/dL (ref 0.2–1.2)
Total Protein: 6.1 g/dL (ref 6.1–8.1)
eGFR: 27 mL/min/{1.73_m2} — ABNORMAL LOW (ref >=60)

## 2021-08-28 LAB — LIPID PANEL
Cholesterol: 212 mg/dL — ABNORMAL HIGH (ref <200)
HDL: 67 mg/dL (ref >=50)
LDL Cholesterol (Calc): 128 mg/dL (calc) — ABNORMAL HIGH
Non-HDL Cholesterol (Calc): 145 mg/dL (calc) — ABNORMAL HIGH (ref <130)
Total CHOL/HDL Ratio: 3.2 (calc) (ref <5.0)
Triglycerides: 78 mg/dL (ref <150)

## 2021-08-28 NOTE — Addendum Note (Signed)
Addended by: Wardell Honour on: 08/28/2021 12:01 PM   Modules accepted: Orders

## 2021-09-03 ENCOUNTER — Ambulatory Visit: Payer: Medicare Other | Admitting: Physician Assistant

## 2021-10-17 ENCOUNTER — Ambulatory Visit: Payer: Medicare Other | Admitting: Cardiology

## 2021-11-05 ENCOUNTER — Telehealth: Payer: Self-pay | Admitting: Cardiology

## 2021-11-05 NOTE — Telephone Encounter (Signed)
Pt c/o swelling: STAT is pt has developed SOB within 24 hours  If swelling, where is the swelling located? legs  How much weight have you gained and in what time span? Does not think she has gained weight  Have you gained 3 pounds in a day or 5 pounds in a week? no  Do you have a log of your daily weights (if so, list)? no  Are you currently taking a fluid pill? yes  Are you currently SOB? no  Have you traveled recently? no  Patient states she has swelling in her legs. She says it usually will swell during the day and goes down at night, but this morning she was still swollen. She says she spoke with her pharmacist and they said she may need to increase her fluid pill. She says she has no other symptoms aside from a little headache.

## 2021-11-05 NOTE — Telephone Encounter (Signed)
Spoke with patient who reports having swelling in calves and ankles. Usually, the swelling goes down when she walks, but lately, it doesn't. She is wearing support stockings.She gets a little sob with activity. She has not been taking Crestor until her new insurance kicks in, which is today. Please advise on edema.

## 2021-11-06 ENCOUNTER — Encounter: Payer: Self-pay | Admitting: Family

## 2021-11-06 ENCOUNTER — Other Ambulatory Visit: Payer: Self-pay

## 2021-11-06 ENCOUNTER — Telehealth: Payer: Self-pay | Admitting: Family

## 2021-11-06 ENCOUNTER — Ambulatory Visit (INDEPENDENT_AMBULATORY_CARE_PROVIDER_SITE_OTHER): Payer: Medicare (Managed Care) | Admitting: Family

## 2021-11-06 VITALS — BP 160/90 | HR 44 | Temp 97.0°F | Resp 16 | Ht 65.0 in | Wt 167.0 lb

## 2021-11-06 DIAGNOSIS — R609 Edema, unspecified: Secondary | ICD-10-CM

## 2021-11-06 DIAGNOSIS — I5041 Acute combined systolic (congestive) and diastolic (congestive) heart failure: Secondary | ICD-10-CM | POA: Diagnosis not present

## 2021-11-06 DIAGNOSIS — R001 Bradycardia, unspecified: Secondary | ICD-10-CM

## 2021-11-06 DIAGNOSIS — I5042 Chronic combined systolic (congestive) and diastolic (congestive) heart failure: Secondary | ICD-10-CM

## 2021-11-06 DIAGNOSIS — I10 Essential (primary) hypertension: Secondary | ICD-10-CM | POA: Diagnosis not present

## 2021-11-06 MED ORDER — CARVEDILOL 6.25 MG PO TABS: 6.2500 mg | ORAL_TABLET | Freq: Two times a day (BID) | ORAL | 0 refills | Status: DC

## 2021-11-06 NOTE — Telephone Encounter (Signed)
Left message for pt to call.

## 2021-11-06 NOTE — Telephone Encounter (Signed)
I saw [name] today at our office for follow high blood pressure which has improved compared to previous visit.daughter reports Heart rate at home runs in the upper 40's for the past two days which is consistent with readings at the office today 44 b/min.she was not taking her Coreg 12.5 mg tablet twice daily but daughter restarted two days ago.I have advised her to reduce Coreg from 12.5 mg tablet to 6.25 mg tablet twice daily and monitor Heart rate if still < 60 /min to notify provider.she has an appointment with you in may,2023.can she be seen sooner? Take you.  Alphonza Tramell, FNP-C

## 2021-11-06 NOTE — Progress Notes (Signed)
Provider: Marlowe Sax FNP-C  Nilam Quakenbush, [name], NP  Patient Care Team: Cherylin Waguespack, [name], NP as PCP - General (Family Medicine) Donato Heinz, MD as PCP - Cardiology (Cardiology)  Extended Emergency Contact Information Primary Emergency Contact: [facility] Phone: [phone] Relation: Daughter  Code Status:  Full Code  Goals of care: Advanced Directive information Advanced Directives 11/06/2021  Does Patient Have a Medical Advance Directive? No  Would patient like information on creating a medical advance directive? No - Patient declined     Chief Complaint  Patient presents with   Acute Visit    Patient complains of High BP.    HPI:  Pt is a 71 y.o. female seen today for an acute visit for evaluation of high blood pressure.she is here with her daughter who provides additional HPI information. Thinks patient speech was slow yesterday on the phone.She denies any facial drooping,slurred speech or weakness on extremities  States has not been taking her Carvedilol 12.5 mg tablet twice daily but restarted two days ago.B/p has been high > 160's and HR in the upper 40's. denies any headache,dizziness,vision changes,fatigue,chest tightness,palpitation,chest pain or shortness of breath.    She is more concerned that her legs have been more swollen.she has gained significant weight 37 lbs per her weight today.  No cough or shortness of breath noted .   Past Medical History:  Diagnosis Date   Chronic combined systolic and diastolic CHF (congestive heart failure) (HCC)    Generalized anxiety disorder    History of MRI    Hypertension    Past Surgical History:  Procedure Laterality Date   CATARACT EXTRACTION, BILATERAL     combined systolic and diastolic congestive heart failure       No Known Allergies  Outpatient Encounter Medications as of 11/06/2021  Medication Sig   carvedilol (COREG) 12.5 MG tablet Take 1 tablet (12.5 mg total) by mouth 2 (two)  times daily with a meal.   furosemide (LASIX) 40 MG tablet Take 1 tablet (40 mg total) by mouth daily.   losartan (COZAAR) 25 MG tablet Take 25 mg by mouth daily.   spironolactone (ALDACTONE) 25 MG tablet Take 0.5 tablets (12.5 mg total) by mouth daily.   Iron, Ferrous Sulfate, 325 (65 Fe) MG TABS Take 325 mg by mouth once for 1 dose.   rosuvastatin (CRESTOR) 10 MG tablet Take 1 tablet (10 mg total) by mouth daily.   No facility-administered encounter medications on file as of 11/06/2021.    Review of Systems  Constitutional:  Negative for appetite change, chills, fatigue and fever.       Weight gain per HPI   Eyes:  Negative for pain, discharge, redness, itching and visual disturbance.  Respiratory:  Negative for cough, chest tightness, shortness of breath and wheezing.   Cardiovascular:  Positive for leg swelling. Negative for chest pain and palpitations.  Gastrointestinal:  Negative for abdominal distention, abdominal pain, nausea and vomiting.  Musculoskeletal:  Positive for gait problem. Negative for arthralgias, back pain, joint swelling, myalgias, neck pain and neck stiffness.  Skin:  Negative for color change, pallor, rash and wound.  Neurological:  Negative for dizziness, syncope, speech difficulty, weakness, light-headedness, numbness and headaches.  Psychiatric/Behavioral:  Negative for agitation, behavioral problems, confusion, self-injury and sleep disturbance. The patient is not nervous/anxious.    Immunization History  Administered Date(s) Administered   PFIZER Comirnaty(Gray Top)Covid-19 Tri-Sucrose Vaccine 04/28/2021   PFIZER(Purple Top)SARS-COV-2 Vaccination 12/16/2019, 01/12/2020   Zoster Recombinat (Shingrix) 07/09/2021  Pertinent  Health Maintenance Due  Topic Date Due   COLONOSCOPY (Pts 45-46yr Insurance coverage will need to be confirmed)  Never done   MAMMOGRAM  Never done   DEXA SCAN  Never done   INFLUENZA VACCINE  01/11/2022 (Originally 05/14/2021)   Fall  Risk 03/14/2021 03/15/2021 04/24/2021 08/27/2021 11/06/2021  Falls in the past year? - - 0 0 0  Was there an injury with Fall? - - 0 0 0  Fall Risk Category Calculator - - 0 0 0  Fall Risk Category - - Low Low Low  Patient Fall Risk Level Moderate fall risk Moderate fall risk Low fall risk Low fall risk Low fall risk  Patient at Risk for Falls Due to - - No Fall Risks History of fall(s) No Fall Risks  Fall risk Follow up - - Falls evaluation completed Education provided;Falls evaluation completed;Falls prevention discussed Falls evaluation completed   Functional Status Survey:    Vitals:   11/06/21 1559  BP: (!) 160/90  Pulse: (!) 44  Resp: 16  Temp: (!) 97 F (36.1 C)  SpO2: 99%  Weight: 167 lb (75.8 kg)  Height: 5' 5"  (1.651 m)   Body mass index is 27.79 kg/m. Physical Exam Vitals reviewed.  Constitutional:      General: She is not in acute distress.    Appearance: Normal appearance. She is overweight. She is not ill-appearing or diaphoretic.  HENT:     Head: Normocephalic.     Right Ear: Tympanic membrane, ear canal and external ear normal. There is no impacted cerumen.     Left Ear: Tympanic membrane, ear canal and external ear normal. There is no impacted cerumen.     Nose: Nose normal. No congestion or rhinorrhea.     Mouth/Throat:     Mouth: Mucous membranes are moist.     Pharynx: Oropharynx is clear. No oropharyngeal exudate or posterior oropharyngeal erythema.  Eyes:     General: No scleral icterus.       Right eye: No discharge.        Left eye: No discharge.     Extraocular Movements: Extraocular movements intact.     Conjunctiva/sclera: Conjunctivae normal.     Pupils: Pupils are equal, round, and reactive to light.  Neck:     Vascular: No carotid bruit.  Cardiovascular:     Rate and Rhythm: Normal rate and regular rhythm.     Pulses: Normal pulses.     Heart sounds: Normal heart sounds. No murmur heard.   No friction rub. No gallop.  Pulmonary:     Effort:  Pulmonary effort is normal. No respiratory distress.     Breath sounds: Normal breath sounds. No wheezing, rhonchi or rales.  Chest:     Chest wall: No tenderness.  Abdominal:     General: Bowel sounds are normal. There is no distension.     Palpations: Abdomen is soft. There is no mass.     Tenderness: There is no abdominal tenderness. There is no right CVA tenderness, left CVA tenderness, guarding or rebound.  Musculoskeletal:        General: No swelling or tenderness. Normal range of motion.     Cervical back: Normal range of motion. No rigidity or tenderness.     Right lower leg: Edema present.     Left lower leg: Edema present.     Comments: Gait steady with Rolator   Lymphadenopathy:     Cervical: No cervical adenopathy.  Skin:  General: Skin is warm and dry.     Coloration: Skin is not pale.     Findings: No bruising, erythema, lesion or rash.  Neurological:     Mental Status: She is alert and oriented to person, place, and time.     Cranial Nerves: No cranial nerve deficit.     Sensory: No sensory deficit.     Motor: No weakness.     Coordination: Coordination normal.     Gait: Gait abnormal.  Psychiatric:        Mood and Affect: Mood normal.        Speech: Speech normal.        Behavior: Behavior normal.    Labs reviewed: Recent Labs    03/12/21 0914 03/13/21 0413 03/19/21 1546 04/02/21 1544 04/30/21 1112 05/07/21 1636 08/27/21 0956  NA  --    < > 143   < > 141 140 143  K  --    < > 4.3   < > 4.5 4.5 4.6  CL  --    < > 103   < > 108 108* 111*  CO2  --    < > 29   < > 26 25 25   GLUCOSE  --    < > 107*   < > 95 112* 91  BUN  --    < > 32*   < > 38* 38* 39*  CREATININE  --    < > 1.26*   < > 1.41* 1.30* 1.98*  CALCIUM  --    < > 8.9   < > 8.3* 8.6* 8.4*  MG 2.0  --  2.1  --   --  2.4*  --    < > = values in this interval not displayed.   Recent Labs    03/06/21 0445 03/10/21 0413 03/15/21 0809 04/30/21 1112 08/27/21 0956  AST 24 15 24 14 18   ALT  14 13 17 11 14   ALKPHOS 82 75 73  --   --   BILITOT 0.7 0.6 0.4 0.4 0.4  PROT 5.8* 5.6* 5.9* 6.5 6.1  ALBUMIN 2.5* 2.1* 2.3*  --   --    Recent Labs    03/15/21 0809 03/19/21 1546 04/30/21 1112 08/27/21 0956  WBC 5.2 5.4 3.6* 5.7  NEUTROABS 3.4  --  1,951 3,865  HGB 8.2* 9.0* 9.1* 8.3*  HCT 26.5* 28.1* 29.1* 27.0*  MCV 87.2 83 84.8 84.6  PLT 256 263 245 247   Lab Results  Component Value Date   TSH 1.54 04/30/2021   No results found for: HGBA1C Lab Results  Component Value Date   CHOL 212 (H) 08/27/2021   HDL 67 08/27/2021   LDLCALC 128 (H) 08/27/2021   TRIG 78 08/27/2021   CHOLHDL 3.2 08/27/2021    Significant Diagnostic Results in last 30 days:  No results found.  Assessment/Plan  1. Essential hypertension B/p still elevated but has improved compared to previous visit. Bradycardia noted.will reduce Coreg from 12.5 mg tablet to 6.25 mg twice daily. - Advised to check Blood pressure at home and record on log provided and notify provider if B/p > 160/90 Will increase losartan if indicated.   2. Edema, unspecified type Has worsen with significant weight gain Advised to take extra Furosemide 40 mg tablet for 3 days then resume 40 mg tablet one by mouth daily.  - check weight daily x 1 week then check weekly  - Notify provider for any worsening cough,leg edema or  shortness of breath. - CBC with Differential/Platelets - BMP with eGFR([facility])  3. Chronic combined systolic and diastolic CHF (congestive heart failure) (HCC) Significant weight gain and edema noted.No cough,shortness of breath or jugular distention noted. Adjust furosemide as above. - follow up with cardiologist sooner.will send message to [place]  - Brain Natriuretic Peptide - carvedilol (COREG) 6.25 MG tablet; Take 1 tablet (6.25 mg total) by mouth 2 (two) times daily with a meal.  Dispense: 180 tablet; Refill: 0  4. Bradycardia HR at home in the upper 40's consistent with reading at  the office today.Asymptomatic. Advised to reduce Carvedilol from 12.5 mg tablet to 6.25 mg tablet one by mouth twice daily  - advised to follow up with cardiologist as soon as possible will notify Dr.schumann Harrell Gave  - Continue to monitor Heart rate at home and notify provider if HR still < 60 b/min    Family/ staff Communication: Reviewed plan of care with patient and daughter Arline Ketter verbalized understanding.   Labs/tests ordered: - CBC with Differential/Platelets - BMP with eGFR([facility])  Next Appointment: Has appointment with Dr.Miller in place   [name], NP

## 2021-11-06 NOTE — Telephone Encounter (Signed)
Is she weighing herself daily and has weight been stable?  Is she taking Lasix 40 mg daily?  If so would take extra dose of Lasix for next 3 days and see if it helps.  I am out of the office the next couple weeks, can we schedule her with APP?

## 2021-11-06 NOTE — Patient Instructions (Addendum)
-   Take extra Furosemide 40 mg tablet for 3 days then resume 40 mg tablet one by mouth daily.   - Reduce Carvedilol from 12.5 mg tablet to 6.25 mg tablet one by mouth twice daily   - check weight daily x 1 week then check weekly   - Notify provider for any worsening cough,leg edema or shortness of breath.

## 2021-11-07 LAB — CBC WITH DIFFERENTIAL/PLATELET
Absolute Monocytes: 120 cells/uL — ABNORMAL LOW (ref 200–950)
Basophils Absolute: 0 cells/uL (ref 0–200)
Basophils Relative: 0 %
Eosinophils Absolute: 20 cells/uL (ref 15–500)
Eosinophils Relative: 0.7 %
HCT: 29.3 % — ABNORMAL LOW (ref 35.0–45.0)
Hemoglobin: 9.6 g/dL — ABNORMAL LOW (ref 11.7–15.5)
Lymphs Abs: 619 cells/uL — ABNORMAL LOW (ref 850–3900)
MCH: 26.7 pg — ABNORMAL LOW (ref 27.0–33.0)
MCHC: 32.8 g/dL (ref 32.0–36.0)
MCV: 81.6 fL (ref 80.0–100.0)
Monocytes Relative: 4.3 %
Neutro Abs: 2041 cells/uL (ref 1500–7800)
Neutrophils Relative %: 72.9 %
RBC: 3.59 10*6/uL — ABNORMAL LOW (ref 3.80–5.10)
RDW: 17.2 % — ABNORMAL HIGH (ref 11.0–15.0)
Total Lymphocyte: 22.1 %
WBC: 2.8 10*3/uL — ABNORMAL LOW (ref 3.8–10.8)

## 2021-11-07 LAB — BASIC METABOLIC PANEL WITH GFR
BUN/Creatinine Ratio: 16 (calc) (ref 6–22)
BUN: 64 mg/dL — ABNORMAL HIGH (ref 7–25)
CO2: 24 mmol/L (ref 20–32)
Calcium: 7.2 mg/dL — ABNORMAL LOW (ref 8.6–10.4)
Chloride: 113 mmol/L — ABNORMAL HIGH (ref 98–110)
Creat: 4.06 mg/dL — ABNORMAL HIGH (ref 0.50–1.05)
Glucose, Bld: 148 mg/dL — ABNORMAL HIGH (ref 65–99)
Potassium: 3.7 mmol/L (ref 3.5–5.3)
Sodium: 143 mmol/L (ref 135–146)
eGFR: 11 mL/min/{1.73_m2} — ABNORMAL LOW (ref >=60)

## 2021-11-07 LAB — BRAIN NATRIURETIC PEPTIDE: Brain Natriuretic Peptide: 660 pg/mL — ABNORMAL HIGH (ref <100)

## 2021-11-07 NOTE — Telephone Encounter (Signed)
Thank you Dr.Schumann

## 2021-11-07 NOTE — Telephone Encounter (Signed)
Thanks, we will get her scheduled sooner Gerald Stabs

## 2021-11-08 ENCOUNTER — Inpatient Hospital Stay (HOSPITAL_COMMUNITY)
Admission: EM | Admit: 2021-11-08 | Discharge: 2021-11-23 | DRG: 871 | Disposition: A | Discharge status: Skilled Nursing Facility | Payer: Medicare (Managed Care) | Attending: Internal Medicine | Admitting: Internal Medicine

## 2021-11-08 ENCOUNTER — Telehealth: Payer: Self-pay | Admitting: Cardiology

## 2021-11-08 ENCOUNTER — Telehealth: Payer: Self-pay | Admitting: *Deleted

## 2021-11-08 DIAGNOSIS — D631 Anemia in chronic kidney disease: Secondary | ICD-10-CM | POA: Diagnosis not present

## 2021-11-08 DIAGNOSIS — Z6829 Body mass index (BMI) 29.0-29.9, adult: Secondary | ICD-10-CM | POA: Diagnosis not present

## 2021-11-08 DIAGNOSIS — D7589 Other specified diseases of blood and blood-forming organs: Secondary | ICD-10-CM | POA: Diagnosis present

## 2021-11-08 DIAGNOSIS — R001 Bradycardia, unspecified: Secondary | ICD-10-CM | POA: Diagnosis present

## 2021-11-08 DIAGNOSIS — N1832 Chronic kidney disease, stage 3b: Secondary | ICD-10-CM | POA: Diagnosis present

## 2021-11-08 DIAGNOSIS — R5381 Other malaise: Secondary | ICD-10-CM | POA: Diagnosis present

## 2021-11-08 DIAGNOSIS — E43 Unspecified severe protein-calorie malnutrition: Secondary | ICD-10-CM | POA: Diagnosis present

## 2021-11-08 DIAGNOSIS — T68XXXA Hypothermia, initial encounter: Secondary | ICD-10-CM | POA: Diagnosis present

## 2021-11-08 DIAGNOSIS — N136 Pyonephrosis: Secondary | ICD-10-CM | POA: Diagnosis present

## 2021-11-08 DIAGNOSIS — E872 Acidosis, unspecified: Secondary | ICD-10-CM | POA: Diagnosis present

## 2021-11-08 DIAGNOSIS — I5040 Unspecified combined systolic (congestive) and diastolic (congestive) heart failure: Secondary | ICD-10-CM | POA: Diagnosis not present

## 2021-11-08 DIAGNOSIS — R262 Difficulty in walking, not elsewhere classified: Secondary | ICD-10-CM | POA: Diagnosis not present

## 2021-11-08 DIAGNOSIS — Z515 Encounter for palliative care: Secondary | ICD-10-CM | POA: Diagnosis not present

## 2021-11-08 DIAGNOSIS — R680 Hypothermia, not associated with low environmental temperature: Secondary | ICD-10-CM | POA: Diagnosis present

## 2021-11-08 DIAGNOSIS — Z66 Do not resuscitate: Secondary | ICD-10-CM | POA: Diagnosis not present

## 2021-11-08 DIAGNOSIS — R531 Weakness: Secondary | ICD-10-CM | POA: Diagnosis present

## 2021-11-08 DIAGNOSIS — M6281 Muscle weakness (generalized): Secondary | ICD-10-CM | POA: Diagnosis not present

## 2021-11-08 DIAGNOSIS — R1310 Dysphagia, unspecified: Secondary | ICD-10-CM | POA: Diagnosis present

## 2021-11-08 DIAGNOSIS — E785 Hyperlipidemia, unspecified: Secondary | ICD-10-CM | POA: Diagnosis present

## 2021-11-08 DIAGNOSIS — Z20822 Contact with and (suspected) exposure to covid-19: Secondary | ICD-10-CM | POA: Diagnosis not present

## 2021-11-08 DIAGNOSIS — N1831 Chronic kidney disease, stage 3a: Secondary | ICD-10-CM | POA: Diagnosis not present

## 2021-11-08 DIAGNOSIS — R6520 Severe sepsis without septic shock: Secondary | ICD-10-CM | POA: Diagnosis present

## 2021-11-08 DIAGNOSIS — G928 Other toxic encephalopathy: Secondary | ICD-10-CM | POA: Diagnosis present

## 2021-11-08 DIAGNOSIS — I5042 Chronic combined systolic (congestive) and diastolic (congestive) heart failure: Secondary | ICD-10-CM | POA: Diagnosis present

## 2021-11-08 DIAGNOSIS — Z5320 Procedure and treatment not carried out because of patient's decision for unspecified reasons: Secondary | ICD-10-CM | POA: Diagnosis present

## 2021-11-08 DIAGNOSIS — I509 Heart failure, unspecified: Secondary | ICD-10-CM | POA: Diagnosis not present

## 2021-11-08 DIAGNOSIS — R471 Dysarthria and anarthria: Secondary | ICD-10-CM | POA: Diagnosis present

## 2021-11-08 DIAGNOSIS — R55 Syncope and collapse: Secondary | ICD-10-CM | POA: Diagnosis not present

## 2021-11-08 DIAGNOSIS — R4182 Altered mental status, unspecified: Secondary | ICD-10-CM

## 2021-11-08 DIAGNOSIS — D61818 Other pancytopenia: Secondary | ICD-10-CM | POA: Diagnosis not present

## 2021-11-08 DIAGNOSIS — N179 Acute kidney failure, unspecified: Secondary | ICD-10-CM | POA: Diagnosis present

## 2021-11-08 DIAGNOSIS — R32 Unspecified urinary incontinence: Secondary | ICD-10-CM | POA: Diagnosis present

## 2021-11-08 DIAGNOSIS — Z7189 Other specified counseling: Secondary | ICD-10-CM | POA: Diagnosis not present

## 2021-11-08 DIAGNOSIS — F411 Generalized anxiety disorder: Secondary | ICD-10-CM | POA: Diagnosis present

## 2021-11-08 DIAGNOSIS — I517 Cardiomegaly: Secondary | ICD-10-CM | POA: Diagnosis not present

## 2021-11-08 DIAGNOSIS — Z79899 Other long term (current) drug therapy: Secondary | ICD-10-CM

## 2021-11-08 DIAGNOSIS — I130 Hypertensive heart and chronic kidney disease with heart failure and stage 1 through stage 4 chronic kidney disease, or unspecified chronic kidney disease: Secondary | ICD-10-CM | POA: Diagnosis present

## 2021-11-08 DIAGNOSIS — I10 Essential (primary) hypertension: Secondary | ICD-10-CM | POA: Diagnosis present

## 2021-11-08 DIAGNOSIS — D649 Anemia, unspecified: Secondary | ICD-10-CM | POA: Diagnosis not present

## 2021-11-08 DIAGNOSIS — A419 Sepsis, unspecified organism: Secondary | ICD-10-CM | POA: Diagnosis present

## 2021-11-08 DIAGNOSIS — J90 Pleural effusion, not elsewhere classified: Secondary | ICD-10-CM | POA: Diagnosis not present

## 2021-11-08 DIAGNOSIS — Z743 Need for continuous supervision: Secondary | ICD-10-CM | POA: Diagnosis not present

## 2021-11-08 DIAGNOSIS — R54 Age-related physical debility: Secondary | ICD-10-CM | POA: Diagnosis not present

## 2021-11-08 DIAGNOSIS — I5043 Acute on chronic combined systolic (congestive) and diastolic (congestive) heart failure: Secondary | ICD-10-CM | POA: Diagnosis not present

## 2021-11-08 DIAGNOSIS — J811 Chronic pulmonary edema: Secondary | ICD-10-CM | POA: Diagnosis not present

## 2021-11-08 DIAGNOSIS — N1330 Unspecified hydronephrosis: Secondary | ICD-10-CM | POA: Diagnosis not present

## 2021-11-08 DIAGNOSIS — U071 COVID-19: Secondary | ICD-10-CM | POA: Diagnosis not present

## 2021-11-08 DIAGNOSIS — R4701 Aphasia: Secondary | ICD-10-CM | POA: Diagnosis not present

## 2021-11-08 LAB — CBG MONITORING, ED: Glucose-Capillary: 113 mg/dL — ABNORMAL HIGH (ref 70–99)

## 2021-11-08 NOTE — Telephone Encounter (Signed)
See results note.BNP was still pending.

## 2021-11-08 NOTE — Telephone Encounter (Signed)
Called left message to call back . Dr Gardiner Rhyme is not in the office  until Nov 19, 2021.  Will advised daughter to follow instruction  from primary  and to follow up with primary office.

## 2021-11-08 NOTE — Telephone Encounter (Signed)
° °  Pt's daughter calling, she said, pt saw pcp last Tuesday and pcp changed some her medications, she said if Dr. Gardiner Rhyme can check it, she said, pcp's notes should be on pt's chart

## 2021-11-08 NOTE — Telephone Encounter (Signed)
Tried calling daughter and LMOM to return call.  (I do not see anything in patient's chart where we can discuss patient's information with daughter.)  Tried calling patient and LMOM to return call.

## 2021-11-08 NOTE — ED Triage Notes (Signed)
Pt brought in my daughter. Patient loss consciousness in triage. Per daughter, patient stated that she was unable to swallow about 30 mins to an hour ago.   Daughter reports a hx of CHF and states that she recently started a statin.

## 2021-11-08 NOTE — Telephone Encounter (Signed)
Kara Mullins, daughter called and stated that patient had bloodwork done on 11/06/2021 and they haven't heard anything back from it. Requesting results.   Please Advise.

## 2021-11-09 ENCOUNTER — Emergency Department (HOSPITAL_COMMUNITY): Payer: Medicare (Managed Care)

## 2021-11-09 ENCOUNTER — Inpatient Hospital Stay (HOSPITAL_COMMUNITY): Payer: Medicare (Managed Care)

## 2021-11-09 ENCOUNTER — Other Ambulatory Visit: Payer: Self-pay

## 2021-11-09 ENCOUNTER — Encounter (HOSPITAL_COMMUNITY): Payer: Self-pay

## 2021-11-09 DIAGNOSIS — R531 Weakness: Secondary | ICD-10-CM | POA: Diagnosis present

## 2021-11-09 DIAGNOSIS — Z6829 Body mass index (BMI) 29.0-29.9, adult: Secondary | ICD-10-CM | POA: Diagnosis not present

## 2021-11-09 DIAGNOSIS — T68XXXA Hypothermia, initial encounter: Secondary | ICD-10-CM | POA: Diagnosis present

## 2021-11-09 DIAGNOSIS — D61818 Other pancytopenia: Secondary | ICD-10-CM | POA: Diagnosis present

## 2021-11-09 DIAGNOSIS — I5042 Chronic combined systolic (congestive) and diastolic (congestive) heart failure: Secondary | ICD-10-CM | POA: Diagnosis present

## 2021-11-09 DIAGNOSIS — Z7189 Other specified counseling: Secondary | ICD-10-CM | POA: Diagnosis not present

## 2021-11-09 DIAGNOSIS — R32 Unspecified urinary incontinence: Secondary | ICD-10-CM | POA: Diagnosis present

## 2021-11-09 DIAGNOSIS — E43 Unspecified severe protein-calorie malnutrition: Secondary | ICD-10-CM

## 2021-11-09 DIAGNOSIS — Z66 Do not resuscitate: Secondary | ICD-10-CM | POA: Diagnosis present

## 2021-11-09 DIAGNOSIS — R4182 Altered mental status, unspecified: Secondary | ICD-10-CM | POA: Diagnosis not present

## 2021-11-09 DIAGNOSIS — D7589 Other specified diseases of blood and blood-forming organs: Secondary | ICD-10-CM | POA: Diagnosis present

## 2021-11-09 DIAGNOSIS — F411 Generalized anxiety disorder: Secondary | ICD-10-CM | POA: Diagnosis present

## 2021-11-09 DIAGNOSIS — R001 Bradycardia, unspecified: Secondary | ICD-10-CM | POA: Diagnosis present

## 2021-11-09 DIAGNOSIS — E872 Acidosis, unspecified: Secondary | ICD-10-CM | POA: Diagnosis present

## 2021-11-09 DIAGNOSIS — A419 Sepsis, unspecified organism: Secondary | ICD-10-CM | POA: Diagnosis present

## 2021-11-09 DIAGNOSIS — N1832 Chronic kidney disease, stage 3b: Secondary | ICD-10-CM

## 2021-11-09 DIAGNOSIS — R5381 Other malaise: Secondary | ICD-10-CM | POA: Diagnosis present

## 2021-11-09 DIAGNOSIS — R680 Hypothermia, not associated with low environmental temperature: Secondary | ICD-10-CM | POA: Diagnosis present

## 2021-11-09 DIAGNOSIS — G928 Other toxic encephalopathy: Secondary | ICD-10-CM | POA: Diagnosis present

## 2021-11-09 DIAGNOSIS — I130 Hypertensive heart and chronic kidney disease with heart failure and stage 1 through stage 4 chronic kidney disease, or unspecified chronic kidney disease: Secondary | ICD-10-CM | POA: Diagnosis present

## 2021-11-09 DIAGNOSIS — E785 Hyperlipidemia, unspecified: Secondary | ICD-10-CM | POA: Diagnosis present

## 2021-11-09 DIAGNOSIS — R55 Syncope and collapse: Secondary | ICD-10-CM | POA: Diagnosis not present

## 2021-11-09 DIAGNOSIS — Z515 Encounter for palliative care: Secondary | ICD-10-CM | POA: Diagnosis not present

## 2021-11-09 DIAGNOSIS — I10 Essential (primary) hypertension: Secondary | ICD-10-CM | POA: Diagnosis not present

## 2021-11-09 DIAGNOSIS — N136 Pyonephrosis: Secondary | ICD-10-CM | POA: Diagnosis present

## 2021-11-09 DIAGNOSIS — R1310 Dysphagia, unspecified: Secondary | ICD-10-CM | POA: Diagnosis present

## 2021-11-09 DIAGNOSIS — N179 Acute kidney failure, unspecified: Secondary | ICD-10-CM | POA: Diagnosis present

## 2021-11-09 DIAGNOSIS — R471 Dysarthria and anarthria: Secondary | ICD-10-CM | POA: Diagnosis present

## 2021-11-09 DIAGNOSIS — R6520 Severe sepsis without septic shock: Secondary | ICD-10-CM | POA: Diagnosis present

## 2021-11-09 DIAGNOSIS — Z20822 Contact with and (suspected) exposure to covid-19: Secondary | ICD-10-CM | POA: Diagnosis present

## 2021-11-09 HISTORY — DX: Hyperlipidemia, unspecified: E78.5

## 2021-11-09 HISTORY — DX: Chronic kidney disease, stage 3b: N18.32

## 2021-11-09 HISTORY — DX: Chronic combined systolic (congestive) and diastolic (congestive) heart failure: I50.42

## 2021-11-09 LAB — CBC WITH DIFFERENTIAL/PLATELET
Abs Immature Granulocytes: 0.02 10*3/uL (ref 0.00–0.07)
Basophils Absolute: 0 10*3/uL (ref 0.0–0.1)
Basophils Relative: 0 %
Eosinophils Absolute: 0 10*3/uL (ref 0.0–0.5)
Eosinophils Relative: 0 %
HCT: 28.1 % — ABNORMAL LOW (ref 36.0–46.0)
Hemoglobin: 9.1 g/dL — ABNORMAL LOW (ref 12.0–15.0)
Immature Granulocytes: 1 %
Lymphocytes Relative: 16 %
Lymphs Abs: 0.5 10*3/uL — ABNORMAL LOW (ref 0.7–4.0)
MCH: 27.1 pg (ref 26.0–34.0)
MCHC: 32.4 g/dL (ref 30.0–36.0)
MCV: 83.6 fL (ref 80.0–100.0)
Monocytes Absolute: 0.1 10*3/uL (ref 0.1–1.0)
Monocytes Relative: 4 %
Neutro Abs: 2.2 10*3/uL (ref 1.7–7.7)
Neutrophils Relative %: 79 %
Platelets: 56 10*3/uL — ABNORMAL LOW (ref 150–400)
RBC: 3.36 MIL/uL — ABNORMAL LOW (ref 3.87–5.11)
RDW: 18.4 % — ABNORMAL HIGH (ref 11.5–15.5)
WBC: 2.8 10*3/uL — ABNORMAL LOW (ref 4.0–10.5)
nRBC: 1.4 % — ABNORMAL HIGH (ref 0.0–0.2)

## 2021-11-09 LAB — LACTIC ACID, PLASMA
Lactic Acid, Venous: 0.5 mmol/L (ref 0.5–1.9)
Lactic Acid, Venous: 0.5 mmol/L (ref 0.5–1.9)

## 2021-11-09 LAB — COMPREHENSIVE METABOLIC PANEL
ALT: 22 U/L (ref 0–44)
AST: 52 U/L — ABNORMAL HIGH (ref 15–41)
Albumin: 2.2 g/dL — ABNORMAL LOW (ref 3.5–5.0)
Alkaline Phosphatase: 154 U/L — ABNORMAL HIGH (ref 38–126)
Anion gap: 8 (ref 5–15)
BUN: 68 mg/dL — ABNORMAL HIGH (ref 8–23)
CO2: 19 mmol/L — ABNORMAL LOW (ref 22–32)
Calcium: 7 mg/dL — ABNORMAL LOW (ref 8.9–10.3)
Chloride: 115 mmol/L — ABNORMAL HIGH (ref 98–111)
Creatinine, Ser: 4.1 mg/dL — ABNORMAL HIGH (ref 0.44–1.00)
GFR, Estimated: 11 mL/min — ABNORMAL LOW (ref >60)
Glucose, Bld: 118 mg/dL — ABNORMAL HIGH (ref 70–99)
Potassium: 4.5 mmol/L (ref 3.5–5.1)
Sodium: 142 mmol/L (ref 135–145)
Total Bilirubin: 1 mg/dL (ref 0.3–1.2)
Total Protein: 6.1 g/dL — ABNORMAL LOW (ref 6.5–8.1)

## 2021-11-09 LAB — LIPASE, BLOOD: Lipase: 65 U/L — ABNORMAL HIGH (ref 11–51)

## 2021-11-09 LAB — CORTISOL: Cortisol, Plasma: 10.7 ug/dL

## 2021-11-09 LAB — RESP PANEL BY RT-PCR (FLU A&B, COVID) ARPGX2
Influenza A by PCR: NEGATIVE
Influenza B by PCR: NEGATIVE
SARS Coronavirus 2 by RT PCR: NEGATIVE

## 2021-11-09 LAB — MRSA NEXT GEN BY PCR, NASAL: MRSA by PCR Next Gen: NOT DETECTED

## 2021-11-09 LAB — URINALYSIS, ROUTINE W REFLEX MICROSCOPIC
Bilirubin Urine: NEGATIVE
Glucose, UA: 50 mg/dL — AB
Ketones, ur: NEGATIVE mg/dL
Nitrite: NEGATIVE
Protein, ur: >=300 mg/dL — AB
Specific Gravity, Urine: 1.012 (ref 1.005–1.030)
WBC, UA: >50 WBC/hpf — ABNORMAL HIGH (ref 0–5)
pH: 5 (ref 5.0–8.0)

## 2021-11-09 LAB — BRAIN NATRIURETIC PEPTIDE: B Natriuretic Peptide: 588.4 pg/mL — ABNORMAL HIGH (ref 0.0–100.0)

## 2021-11-09 LAB — TROPONIN I (HIGH SENSITIVITY)
Troponin I (High Sensitivity): 22 ng/L — ABNORMAL HIGH (ref <18)
Troponin I (High Sensitivity): 22 ng/L — ABNORMAL HIGH (ref <18)

## 2021-11-09 LAB — PROCALCITONIN: Procalcitonin: <0.1 ng/mL

## 2021-11-09 LAB — TSH: TSH: 3.941 u[IU]/mL (ref 0.350–4.500)

## 2021-11-09 MED ORDER — CEFEPIME HCL 2 G IJ SOLR
2.0000 g | Freq: Once | INTRAMUSCULAR | Status: AC
Start: 2021-11-09 — End: 2021-11-09
  Administered 2021-11-09: 2 g via INTRAVENOUS
  Filled 2021-11-09: qty 2

## 2021-11-09 MED ORDER — LACTATED RINGERS IV SOLN: INTRAVENOUS | Status: DC

## 2021-11-09 MED ORDER — CARVEDILOL 6.25 MG PO TABS
6.2500 mg | ORAL_TABLET | Freq: Two times a day (BID) | ORAL | Status: DC
  Administered 2021-11-09 – 2021-11-11 (×4): 6.25 mg via ORAL
  Filled 2021-11-09 (×4): qty 1

## 2021-11-09 MED ORDER — SODIUM CHLORIDE 0.9 % IV BOLUS
500.0000 mL | Freq: Once | INTRAVENOUS | Status: AC
Start: 2021-11-09 — End: 2021-11-09
  Administered 2021-11-09: 500 mL via INTRAVENOUS

## 2021-11-09 MED ORDER — ACETAMINOPHEN 650 MG RE SUPP: 650.0000 mg | Freq: Four times a day (QID) | RECTAL | Status: DC | PRN

## 2021-11-09 MED ORDER — LORAZEPAM 2 MG/ML IJ SOLN
0.5000 mg | Freq: Once | INTRAMUSCULAR | Status: AC
  Administered 2021-11-09: 0.5 mg via INTRAVENOUS
  Filled 2021-11-09: qty 1

## 2021-11-09 MED ORDER — ONDANSETRON HCL 4 MG/2ML IJ SOLN: 4.0000 mg | Freq: Four times a day (QID) | INTRAMUSCULAR | Status: DC | PRN

## 2021-11-09 MED ORDER — ACETAMINOPHEN 325 MG PO TABS
650.0000 mg | ORAL_TABLET | Freq: Four times a day (QID) | ORAL | Status: DC | PRN
  Administered 2021-11-13: 650 mg via ORAL
  Filled 2021-11-09 (×2): qty 2

## 2021-11-09 MED ORDER — ORAL CARE MOUTH RINSE
15.0000 mL | Freq: Two times a day (BID) | OROMUCOSAL | Status: DC
  Administered 2021-11-09 – 2021-11-23 (×25): 15 mL via OROMUCOSAL

## 2021-11-09 MED ORDER — ONDANSETRON HCL 4 MG PO TABS: 4.0000 mg | ORAL_TABLET | Freq: Four times a day (QID) | ORAL | Status: DC | PRN

## 2021-11-09 MED ORDER — CHLORHEXIDINE GLUCONATE CLOTH 2 % EX PADS
6.0000 | MEDICATED_PAD | Freq: Every day | CUTANEOUS | Status: DC
  Administered 2021-11-10 – 2021-11-19 (×9): 6 via TOPICAL

## 2021-11-09 MED ORDER — VANCOMYCIN VARIABLE DOSE PER UNSTABLE RENAL FUNCTION (PHARMACIST DOSING): Status: DC

## 2021-11-09 MED ORDER — ALBUMIN HUMAN 25 % IV SOLN
50.0000 g | Freq: Once | INTRAVENOUS | Status: AC
  Administered 2021-11-09: 50 g via INTRAVENOUS
  Filled 2021-11-09: qty 200

## 2021-11-09 MED ORDER — ROSUVASTATIN CALCIUM 10 MG PO TABS
10.0000 mg | ORAL_TABLET | Freq: Every day | ORAL | Status: DC
  Administered 2021-11-09 – 2021-11-23 (×12): 10 mg via ORAL
  Filled 2021-11-09 (×13): qty 1

## 2021-11-09 MED ORDER — VANCOMYCIN HCL IN DEXTROSE 1-5 GM/200ML-% IV SOLN
1000.0000 mg | Freq: Once | INTRAVENOUS | Status: AC
  Administered 2021-11-09: 1000 mg via INTRAVENOUS
  Filled 2021-11-09: qty 200

## 2021-11-09 MED ORDER — METRONIDAZOLE 500 MG/100ML IV SOLN
500.0000 mg | Freq: Two times a day (BID) | INTRAVENOUS | Status: DC
  Administered 2021-11-09 – 2021-11-12 (×7): 500 mg via INTRAVENOUS
  Filled 2021-11-09 (×7): qty 100

## 2021-11-09 MED ORDER — LACTATED RINGERS IV SOLN: INTRAVENOUS | Status: AC

## 2021-11-09 MED ORDER — SODIUM CHLORIDE 0.9 % IV SOLN
2.0000 g | INTRAVENOUS | Status: DC
  Administered 2021-11-09 – 2021-11-12 (×4): 2 g via INTRAVENOUS
  Filled 2021-11-09 (×4): qty 2

## 2021-11-09 NOTE — TOC Progression Note (Signed)
Transition of Care [facility]) - Progression Note    Patient Details  Name: Kara Mullins MRN: [id number] Date of Birth: Dec 05, 1950  Transition of Care [facility] CM/SW Contact  Purcell Mouton, RN Phone Number: 11/09/2021, 3:34 PM  Clinical Narrative:    Spoke with daughter, who pt lives with. Explained that [facility] team will follow pt for discharge needs. At present time daughter is not sure of any needs. Will continue to follow.    Expected Discharge Plan: [place] Barriers to Discharge: No Barriers Identified  Expected Discharge Plan and Services Expected Discharge Plan: [place], [place] Living arrangements for the past 2 months: Single Family Home                                       Social Determinants of Health (SDOH) Interventions    Readmission Risk Interventions No flowsheet data found.

## 2021-11-09 NOTE — Progress Notes (Signed)
Notified admitting [facility] physician of patients arrival at 0330. I have not received a response, and H&P has not been completed. I notified the NP for [facility] of this as well, and that I still do not have admit orders. PT has not required anything emergent at this point in time.

## 2021-11-09 NOTE — Telephone Encounter (Signed)
LMOM to return call.

## 2021-11-09 NOTE — Telephone Encounter (Signed)
Patient and daughter Notified and agreed. Verbal consent to speak with daughter, [name].  Daughter took patient to ER yesterday. Patient currently in ICU per patient.

## 2021-11-09 NOTE — ED Notes (Signed)
ED TO INPATIENT HANDOFF REPORT  ED Nurse Name and Phone #: Erick Colace, RN [phone]   S Name/Age/Gender Kara Mullins 71 y.o. female Room/Bed: RESA/RESA  Code Status   Code Status: Prior  Home/SNF/Other Home Patient oriented to: self and place Is this baseline? No   Triage Complete: Triage complete  Chief Complaint Hypothermia [T68.XXXA]  Triage Note Pt brought in my daughter. Patient loss consciousness in triage. Per daughter, patient stated that she was unable to swallow about 30 mins to an hour ago.   Daughter reports a hx of CHF and states that she recently started a statin.    Allergies No Known Allergies  Level of Care/Admitting Diagnosis ED Disposition     ED Disposition  Admit   Condition  --   Comment  Hospital Area: [place] [[id number]]  Level of Care: Stepdown [14]  Admit to SDU based on following criteria: Severe physiological/psychological symptoms:  Any diagnosis requiring assessment & intervention at least every 4 hours on an ongoing basis to obtain desired patient outcomes including stability and rehabilitation  May [facility] patient to [facility] if equivalent level of care is available:: Yes  Covid Evaluation: Confirmed COVID Negative  Diagnosis: Hypothermia [829562]  Admitting Physician: Vianne Bulls [[id number]]  Attending Physician: Vianne Bulls [[id number]]  Estimated length of stay: past midnight tomorrow  Certification:: I certify this patient will need inpatient services for at least 2 midnights          B Medical/Surgery History Past Medical History:  Diagnosis Date   Chronic combined systolic and diastolic CHF (congestive heart failure) (HCC)    Generalized anxiety disorder    History of MRI    Hypertension    Past Surgical History:  Procedure Laterality Date   CATARACT EXTRACTION, BILATERAL     combined systolic and diastolic congestive heart failure        A IV  Location/Drains/Wounds Patient Lines/Drains/Airways Status     Active Line/Drains/Airways     Name Placement date Placement time Site Days   Peripheral IV 11/09/21 18 G 1.16" Left Antecubital 11/09/21  0004  Antecubital  less than 1   Peripheral IV 11/09/21 20 G 1.16" Right Antecubital 11/09/21  0005  Antecubital  less than 1   Wound / Incision (Open or Dehisced) 03/13/21 Puncture Sacrum CT bone marrow biopsy 03/13/21  1543  Sacrum  241            Intake/Output Last 24 hours No intake or output data in the 24 hours ending 11/09/21 0212  Labs/Imaging Results for orders placed or performed during the hospital encounter of 11/08/21 (from the past 48 hour(s))  Comprehensive metabolic panel     Status: Abnormal   Collection Time: 11/08/21 11:57 PM  Result Value Ref Range   Sodium 142 135 - 145 mmol/L   Potassium 4.5 3.5 - 5.1 mmol/L   Chloride 115 (H) 98 - 111 mmol/L   CO2 19 (L) 22 - 32 mmol/L   Glucose, Bld 118 (H) 70 - 99 mg/dL    Comment: Glucose reference range applies only to samples taken after fasting for at least 8 hours.   BUN 68 (H) 8 - 23 mg/dL   Creatinine, Ser 4.10 (H) 0.44 - 1.00 mg/dL   Calcium 7.0 (L) 8.9 - 10.3 mg/dL   Total Protein 6.1 (L) 6.5 - 8.1 g/dL   Albumin 2.2 (L) 3.5 - 5.0 g/dL   AST 52 (H) 15 - 41 U/L  ALT 22 0 - 44 U/L   Alkaline Phosphatase 154 (H) 38 - 126 U/L   Total Bilirubin 1.0 0.3 - 1.2 mg/dL   GFR, Estimated 11 (L) >60 mL/min    Comment: (NOTE) Calculated using the CKD-EPI Creatinine Equation (2021)    Anion gap 8 5 - 15    Comment: Performed at [facility], [place] [address] [place], [place] [address]  CBC with Differential     Status: Abnormal   Collection Time: 11/08/21 11:57 PM  Result Value Ref Range   WBC 2.8 (L) 4.0 - 10.5 K/uL   RBC 3.36 (L) 3.87 - 5.11 MIL/uL   Hemoglobin 9.1 (L) 12.0 - 15.0 g/dL   HCT 28.1 (L) 36.0 - 46.0 %   MCV 83.6 80.0 - 100.0 fL   MCH 27.1 26.0 - 34.0 pg   MCHC 32.4 30.0 - 36.0  g/dL   RDW 18.4 (H) 11.5 - 15.5 %   Platelets 56 (L) 150 - 400 K/uL    Comment: SPECIMEN CHECKED FOR CLOTS Immature Platelet Fraction may be clinically indicated, consider ordering this additional test [id number] REPEATED TO VERIFY PLATELET COUNT CONFIRMED BY SMEAR    nRBC 1.4 (H) 0.0 - 0.2 %   Neutrophils Relative % 79 %   Neutro Abs 2.2 1.7 - 7.7 K/uL   Lymphocytes Relative 16 %   Lymphs Abs 0.5 (L) 0.7 - 4.0 K/uL   Monocytes Relative 4 %   Monocytes Absolute 0.1 0.1 - 1.0 K/uL   Eosinophils Relative 0 %   Eosinophils Absolute 0.0 0.0 - 0.5 K/uL   Basophils Relative 0 %   Basophils Absolute 0.0 0.0 - 0.1 K/uL   Immature Granulocytes 1 %   Abs Immature Granulocytes 0.02 0.00 - 0.07 K/uL   Giant PLTs PRESENT     Comment: Performed at [facility], [place] [address], [place], [place] [address]  Troponin I (High Sensitivity)     Status: Abnormal   Collection Time: 11/08/21 11:57 PM  Result Value Ref Range   Troponin I (High Sensitivity) 22 (H) <18 ng/L    Comment: (NOTE) Elevated high sensitivity troponin I (hsTnI) values and significant  changes across serial measurements may suggest ACS but many other  chronic and acute conditions are known to elevate hsTnI results.  Refer to the "Links" section for chest pain algorithms and additional  guidance. Performed at [facility], [place] [address], [place], [place] [address]   Brain natriuretic peptide     Status: Abnormal   Collection Time: 11/08/21 11:57 PM  Result Value Ref Range   B Natriuretic Peptide 588.4 (H) 0.0 - 100.0 pg/mL    Comment: Performed at [facility], [place] [address], [place], [place] [address]  Lipase, blood     Status: Abnormal   Collection Time: 11/08/21 11:57 PM  Result Value Ref Range   Lipase 65 (H) 11 - 51 U/L    Comment: Performed at [facility], [place] [address], [place], [place] [address]  CBG monitoring, ED     Status: Abnormal    Collection Time: 11/08/21 11:58 PM  Result Value Ref Range   Glucose-Capillary 113 (H) 70 - 99 mg/dL    Comment: Glucose reference range applies only to samples taken after fasting for at least 8 hours.  Resp Panel by RT-PCR (Flu A&B, Covid) Nasopharyngeal Swab     Status: None   Collection Time: 11/09/21 12:13 AM   Specimen: Nasopharyngeal Swab;  Nasopharyngeal(NP) swabs in vial transport medium  Result Value Ref Range   SARS Coronavirus 2 by RT PCR NEGATIVE NEGATIVE    Comment: (NOTE) SARS-CoV-2 target nucleic acids are NOT DETECTED.  The SARS-CoV-2 RNA is generally detectable in upper respiratory specimens during the acute phase of infection. The lowest concentration of SARS-CoV-2 viral copies this assay can detect is 138 copies/mL. A negative result does not preclude SARS-Cov-2 infection and should not be used as the sole basis for treatment or other patient management decisions. A negative result may occur with  improper specimen collection/handling, submission of specimen other than nasopharyngeal swab, presence of viral mutation(s) within the areas targeted by this assay, and inadequate number of viral copies(<138 copies/mL). A negative result must be combined with clinical observations, patient history, and epidemiological information. The expected result is Negative.  Fact Sheet for Patients:  EntrepreneurPulse.com.au  Fact Sheet for Healthcare Providers:  IncredibleEmployment.be  This test is no t yet approved or cleared by the [place] FDA and  has been authorized for detection and/or diagnosis of SARS-CoV-2 by FDA under an Emergency Use Authorization (EUA). This EUA will remain  in effect (meaning this test can be used) for the duration of the COVID-19 declaration under Section 564(b)(1) of the Act, 21 U.S.C.section 360bbb-3(b)(1), unless the authorization is terminated  or revoked sooner.       Influenza A by PCR NEGATIVE  NEGATIVE   Influenza B by PCR NEGATIVE NEGATIVE    Comment: (NOTE) The Xpert Xpress SARS-CoV-2/FLU/RSV plus assay is intended as an aid in the diagnosis of influenza from Nasopharyngeal swab specimens and should not be used as a sole basis for treatment. Nasal washings and aspirates are unacceptable for Xpert Xpress SARS-CoV-2/FLU/RSV testing.  Fact Sheet for Patients: EntrepreneurPulse.com.au  Fact Sheet for Healthcare Providers: IncredibleEmployment.be  This test is not yet approved or cleared by the [place] FDA and has been authorized for detection and/or diagnosis of SARS-CoV-2 by FDA under an Emergency Use Authorization (EUA). This EUA will remain in effect (meaning this test can be used) for the duration of the COVID-19 declaration under Section 564(b)(1) of the Act, 21 U.S.C. section 360bbb-3(b)(1), unless the authorization is terminated or revoked.  Performed at [facility], [place] [address] [place], [place] [address]   Lactic acid, plasma     Status: None   Collection Time: 11/09/21 12:22 AM  Result Value Ref Range   Lactic Acid, Venous 0.5 0.5 - 1.9 mmol/L    Comment: Performed at [facility], [place] [address] [place], [place] [address]  TSH     Status: None   Collection Time: 11/09/21 12:22 AM  Result Value Ref Range   TSH 3.941 0.350 - 4.500 uIU/mL    Comment: Performed by a 3rd Generation assay with a functional sensitivity of <=0.01 uIU/mL. Performed at [facility], [place] [address] [place], [place] [address]    CT Head Wo Contrast  Result Date: 11/09/2021 CLINICAL DATA:  Mental status change. EXAM: CT HEAD WITHOUT CONTRAST TECHNIQUE: Contiguous axial images were obtained from the base of the skull through the vertex without intravenous contrast. RADIATION DOSE REDUCTION: This exam was performed according to the departmental dose-optimization program which includes  automated exposure control, adjustment of the mA and/or kV according to patient size and/or use of iterative reconstruction technique. COMPARISON:  None. FINDINGS: Brain: No evidence of acute infarction, hemorrhage, hydrocephalus, extra-axial collection or mass lesion/mass effect. Vascular: No hyperdense vessel or unexpected calcification.  Skull: Normal. Negative for fracture or focal lesion. Sinuses/Orbits: No acute finding. Other: None. IMPRESSION: No acute intracranial abnormality. Electronically Signed   By: Ronney Asters M.D.   On: 11/09/2021 01:06   DG Chest Port 1 View  Result Date: 11/09/2021 CLINICAL DATA:  Weakness. EXAM: PORTABLE CHEST 1 VIEW COMPARISON:  Chest CT dated 03/05/2021. FINDINGS: Cardiomegaly with vascular congestion, edema and small bilateral pleural effusions. Pneumonia is not excluded. No pneumothorax. Atherosclerotic calcification of the aorta. No acute osseous pathology. IMPRESSION: Cardiomegaly with findings of CHF and small bilateral pleural effusions. Pneumonia is not excluded. Electronically Signed   By: Anner Crete M.D.   On: 11/09/2021 00:36    Pending Labs Unresulted Labs (From admission, onward)     Start     Ordered   11/09/21 0210  Procalcitonin - Baseline  ONCE - STAT,   STAT        11/09/21 0210   11/09/21 0208  Cortisol, Random  Once,   R        11/09/21 0209   11/09/21 0005  Urinalysis, Routine w reflex microscopic Urine, Clean Catch  Once,   STAT        11/09/21 0005   11/09/21 0005  Lactic acid, plasma  Now then every 2 hours,   STAT      11/09/21 0005            Vitals/Pain Today's Vitals   11/09/21 0115 11/09/21 0130 11/09/21 0145 11/09/21 0200  BP: (!) 173/90 (!) 157/103 (!) 169/94 (!) 167/86  Pulse: (!) 38 (!) 41 (!) 43 (!) 40  Resp: 14 (!) 9 12 13   Temp:      TempSrc:      SpO2: 100% 100% 100% 100%    Isolation Precautions No active isolations  Medications Medications  ceFEPIme (MAXIPIME) 2 g in sodium chloride 0.9 % 100  mL IVPB (has no administration in time range)  metroNIDAZOLE (FLAGYL) IVPB 500 mg (has no administration in time range)  vancomycin (VANCOCIN) IVPB 1000 mg/200 mL premix (has no administration in time range)  sodium chloride 0.9 % bolus 500 mL (500 mLs Intravenous New Bag/Given 11/09/21 0201)    Mobility non-ambulatory High fall risk   Focused Assessments    R Recommendations: See Admitting Provider Note  Report given to:   Additional Notes:

## 2021-11-09 NOTE — TOC Progression Note (Signed)
Transition of Care [facility]) - Progression Note    Patient Details  Name: Stephinie Battisti MRN: [id number] Date of Birth: Feb 08, 1951  Transition [facility]) CM/SW Contact  Purcell Mouton, RN Phone Number: 11/09/2021, 11:23 AM  Clinical Narrative:    Pt from home with Adult children. TOC will continue to follow for discharge needs.    Expected Discharge Plan: [place] Barriers to Discharge: No Barriers Identified  Expected Discharge Plan and Services Expected Discharge Plan: [place], [place] Living arrangements for the past 2 months: Single Family Home                                       Social Determinants of Health (SDOH) Interventions    Readmission Risk Interventions No flowsheet data found.

## 2021-11-09 NOTE — Progress Notes (Signed)
Pharmacy Antibiotic Note  Kara Mullins is a 71 y.o. female admitted on 11/08/2021 with past medical history of chronic renal insufficiency, congestive heart failure, hypertension.  Patient presenting today for evaluation of weakness.  Pharmacy has been consulted for cefepime and vancomycin for sepsis  Plan: Vancomycin 1gm IV x 1, follow renal function and level before next dose Cefepime 2gm IV q24h Flagyl 500mg  IV q12h per MD Follow Scr, cultures and clinical course     Temp (24hrs), Avg:88.8 F (31.6 C), Min:87.6 F (30.9 C), Max:89.9 F (32.2 C)  Recent Labs  Lab 11/06/21 1617 11/08/21 2357 11/09/21 0022 11/09/21 0159  WBC 2.8* 2.8*  --   --   CREATININE 4.06* 4.10*  --   --   LATICACIDVEN  --   --  0.5 0.5    Estimated Creatinine Clearance: 13.2 mL/min (A) (by C-G formula based on SCr of 4.1 mg/dL (H)).    No Known Allergies  Antimicrobials this admission: 1/27 vanc >> 1/27 cefepime >> 1/27 flagyl >>  Dose adjustments this admission:   Microbiology results:  1/27 MRSA PCR:   Thank you for allowing pharmacy to be a part of this patients care.  Dolly Rias RPh 11/09/2021, 3:22 AM

## 2021-11-09 NOTE — ED Provider Notes (Signed)
[facility] DEPT Provider Note   CSN: [id number] Arrival date & time: 11/08/21  2346     History  Chief Complaint  Patient presents with   Loss of Consciousness    Kara Mullins is a 71 y.o. female.  Patient is a 71 year old female with past medical history of chronic renal insufficiency, congestive heart failure, hypertension.  Patient presenting today for evaluation of weakness.  According to the daughter, she has been less active and less conversive over the past week.  She had some blood work performed, but does not know the results.  This evening, she became weaker and less responsive and daughter brings her for evaluation.  While she was in the waiting room, patient apparently went unresponsive and began to slide out of her chair.  She was then emergently brought back to the exam room.  She was initially altered and incontinent of urine, but then seemed to regain consciousness shortly afterward.  She denies to me that she is experiencing any specific complaints, just generalized weakness.  The history is provided by the patient.      Home Medications Prior to Admission medications   Medication Sig Start Date End Date Taking? Authorizing Provider  carvedilol (COREG) 6.25 MG tablet Take 1 tablet (6.25 mg total) by mouth 2 (two) times daily with a meal. 11/06/21 02/04/22  [name], [name], NP  furosemide (LASIX) 40 MG tablet Take 1 tablet (40 mg total) by mouth daily. 04/04/21   Donato Heinz, MD  Iron, Ferrous Sulfate, 325 (65 Fe) MG TABS Take 325 mg by mouth once for 1 dose. 05/03/21 05/03/21  [name], [name], NP  losartan (COZAAR) 25 MG tablet Take 25 mg by mouth daily.    [provider]  rosuvastatin (CRESTOR) 10 MG tablet Take 1 tablet (10 mg total) by mouth daily. 05/07/21 08/05/21  Donato Heinz, MD  spironolactone (ALDACTONE) 25 MG tablet Take 0.5 tablets (12.5 mg total) by mouth daily. 04/04/21   Donato Heinz, MD      Allergies    Patient has no known allergies.    Review of Systems   Review of Systems  All other systems reviewed and are negative.  Physical Exam Updated Vital Signs BP (!) 181/89    Pulse (!) 41    Resp 16    SpO2 100%  Physical Exam Vitals and nursing note reviewed.  Constitutional:      General: She is not in acute distress.    Appearance: She is well-developed. She is not diaphoretic.  HENT:     Head: Normocephalic and atraumatic.  Cardiovascular:     Rate and Rhythm: Normal rate and regular rhythm.     Heart sounds: No murmur heard.   No friction rub. No gallop.  Pulmonary:     Effort: Pulmonary effort is normal. No respiratory distress.     Breath sounds: Normal breath sounds. No wheezing.  Abdominal:     General: Bowel sounds are normal. There is no distension.     Palpations: Abdomen is soft.     Tenderness: There is no abdominal tenderness.  Musculoskeletal:        General: Normal range of motion.     Cervical back: Normal range of motion and neck supple.  Skin:    General: Skin is warm and dry.  Neurological:     General: No focal deficit present.     Mental Status: She is alert and oriented to person, place, and  time.     Comments: Patient moves all extremities.  There is no focal deficit noted on neurologic exam.  Cranial nerves are intact.  Patient initially altered, but regained consciousness within several minutes and is now alert, oriented, and appropriate.    ED Results / Procedures / Treatments   Labs (all labs ordered are listed, but only abnormal results are displayed) Labs Reviewed  CBG MONITORING, ED - Abnormal; Notable for the following components:      Result Value   Glucose-Capillary 113 (*)    All other components within normal limits  COMPREHENSIVE METABOLIC PANEL  CBC WITH DIFFERENTIAL/PLATELET  URINALYSIS, ROUTINE W REFLEX MICROSCOPIC  BRAIN NATRIURETIC PEPTIDE  LIPASE, BLOOD  LACTIC ACID, PLASMA  LACTIC ACID, PLASMA   TROPONIN I (HIGH SENSITIVITY)    EKG EKG Interpretation  Date/Time:  Friday November 09 2021 00:14:47 EST Ventricular Rate:  44 PR Interval:  169 QRS Duration: 121 QT Interval:  593 QTC Calculation: 508 R Axis:   58 Text Interpretation: Sinus or ectopic atrial bradycardia Probable left ventricular hypertrophy Nonspecific T abnormalities, lateral leads Prolonged QT interval Confirmed by Veryl Speak [phone]) on 11/09/2021 12:18:11 AM  Radiology No results found.  Procedures Procedures  Continuous cardiac monitoring  Medications Ordered in ED Medications - No data to display  ED Course/ Medical Decision Making/ A&P  This patient presents to the ED for concern of altered mental status and weakness, this involves an extensive number of treatment options, and is a complaint that carries with it a high risk of complications and morbidity.  The differential diagnosis includes acute stroke, seizure, electrolyte abnormality, infection   Co morbidities that complicate the patient evaluation  None   Additional history obtained:  Additional history obtained from daughter who was present at bedside No outside records reviewed   Lab Tests:  I Ordered, and personally interpreted labs.  The pertinent results include: CBC revealing thrombocytopenia, basic metabolic panel reflecting worsening renal function, but laboratory studies otherwise unremarkable.   Imaging Studies ordered:  I ordered imaging studies including CT scan of the head which was unremarkable and chest x-ray which was suggestive of mild CHF I independently visualized and interpreted imaging  I agree with the radiologist interpretation   Cardiac Monitoring:  The patient was maintained on a cardiac monitor.  I personally viewed and interpreted the cardiac monitored which showed an underlying rhythm of: Continuous cardiac monitoring   Medicines ordered and prescription drug management:  I ordered medication  including IV fluids for azotemia/worsening renal function  Reevaluation of the patient after these medicines showed that the patient improved I have reviewed the patients home medicines and have made adjustments as needed   Test Considered:  No other test considered or indicated   Critical Interventions:  Bear hugger applied for hypothermia and IV fluids administered for renal failure   Consultations Obtained:  I requested consultation with the hospitalist, Dr. Myna Hidalgo,  and discussed lab and imaging findings as well as pertinent plan - they recommend: Admission   Problem List / ED Course:  Patient brought for evaluation of altered mental status and weakness as described in the HPI.  She had some sort of unresponsive episode in the waiting room and was immediately brought to an exam room.  By the time of my evaluation, the patient was regaining consciousness and vital signs were stable. Work-up initiated revealing worsening renal function with creatinine increased from 2-4 with elevation of BUN.  Patient also found to have hypothermia with body  temperature of 87 rectally.  Bear hugger applied.  I am uncertain as to the etiology of her hypothermia, however TSH is normal. Remainder of the work-up reveals a thrombocytopenia, the etiology of which I am uncertain. Patient will clearly require admission I have spoken with Dr. Myna Hidalgo regarding this and he will evaluate and admit.   Reevaluation:  After the interventions noted above, I reevaluated the patient and found that they have :improved   Social Determinants of Health:  None   Dispostion:  After consideration of the diagnostic results and the patients response to treatment, I feel that the patent would benefit from admission for further care.  CRITICAL CARE Performed by: Veryl Speak Total critical care time: 40 minutes Critical care time was exclusive of separately billable procedures and treating other patients. Critical  care was necessary to treat or prevent imminent or life-threatening deterioration. Critical care was time spent personally by me on the following activities: development of treatment plan with patient and/or surrogate as well as nursing, discussions with consultants, evaluation of patient's response to treatment, examination of patient, obtaining history from patient or surrogate, ordering and performing treatments and interventions, ordering and review of laboratory studies, ordering and review of radiographic studies, pulse oximetry and re-evaluation of patient's condition.     Final Clinical Impression(s) / ED Diagnoses Final diagnoses:  None    Rx / DC Orders ED Discharge Orders     None         Veryl Speak, MD 11/09/21 [phone]

## 2021-11-09 NOTE — H&P (Signed)
History and Physical    Kara Mullins JSH:[id number] DOB: 20-Jan-1951 DOA: 11/08/2021  PCP: [name], NP  Patient coming from: Home.  I have personally briefly reviewed patient's old medical records in [place]  Chief Complaint: Inability to swallow and LOC while in triage.  HPI: Kara Mullins is a 71 y.o. female with medical history significant of chronic combined systolic and diastolic heart failure, generalized anxiety disorder, hyperlipidemia, hypertension, stage IIIb CKD who was brought to the emergency department due to inability to swallow less than an hour before arrival.  The patient's daughter stated that although her mental status is better she is still having dysphagia and having trouble verbalizing words.  No headache, chest pain, dyspnea, fever, nausea or emesis at home.  She has been less active and less interactive with her daughter at home for the past week.  She had a near syncopal event while in the ER waiting room but seems to be okay by the time she was transferred back to the main ER area.  ED Course: Initial vital signs were temperature Have you been able to check your blood pressure today 87.6 F, pulse 41, respirations 16, BP 181/89 mmHg O2 sat 100% on room air.  The patient was started on a warming blanket, given 500 mL of NS bolus, cefepime, metronidazole and vancomycin.  Lab work: Her urinalysis showed glucosuria 50 and more than 300 mg/dL proteinuria.  There was moderate hemoglobinuria, small leukocyte esterase, more than 50 WBC, many bacteria with WBC clumps.  CBC is her white count 2.8, hemoglobin 9.1 g/dL and platelets 56.  Troponin was 22 ng/L [place], BNP 588.4 pg/mL.  CMP with a sodium 142, potassium 4.5, chloride 115 and CO2 19 mmol/L.  Glucose 18, BUN 6 PA, creatinine 4.1 and calcium 7.01 mg/dL.  Total protein was 6.1 and albumin 2.2 g/dL.  Lipase was 66, AST 52 and alkaline phosphatase 154 units/L.  ALT was normal.  Bilirubin unremarkable.  TSH  was 3.9 IU/mL.  Lactic acid was normal twice.  Cortisol 10.7 mcg/dL.  Procalcitonin normal.  Imaging: One-view portable chest radiograph showed cardiomegaly with findings of CHF small bilateral pleural effusion.  Pneumonia not excluded.  CT head without contrast showed no acute intracranial abnormality.  Please see images and full radiology report for further details.  Review of Systems: As per HPI otherwise all other systems reviewed and are negative.  Past Medical History:  Diagnosis Date   Chronic combined systolic and diastolic CHF (congestive heart failure) (HCC)    Chronic combined systolic and diastolic congestive heart failure ([place]) 11/09/2021   Generalized anxiety disorder    History of MRI    Hyperlipidemia 11/09/2021   Hypertension    Stage 3b chronic kidney disease (CKD) ([place]) 11/09/2021   Past Surgical History:  Procedure Laterality Date   CATARACT EXTRACTION, BILATERAL     combined systolic and diastolic congestive heart failure      Social History  reports that she has never smoked. She has never used smokeless tobacco. She reports that she does not currently use alcohol. She reports that she does not use drugs.  No Known Allergies  Family History  Problem Relation Age of Onset   Throat cancer Father    Prior to Admission medications   Medication Sig Start Date End Date Taking? Authorizing Provider  carvedilol (COREG) 6.25 MG tablet Take 1 tablet (6.25 mg total) by mouth 2 (two) times daily with a meal. 11/06/21 02/04/22  [name], [name], NP  furosemide (LASIX) 40 MG tablet Take 1 tablet (40 mg total) by mouth daily. 04/04/21   Donato Heinz, MD  Iron, Ferrous Sulfate, 325 (65 Fe) MG TABS Take 325 mg by mouth once for 1 dose. 05/03/21 05/03/21  [name], [name], NP  losartan (COZAAR) 25 MG tablet Take 25 mg by mouth daily.    [provider]  rosuvastatin (CRESTOR) 10 MG tablet Take 1 tablet (10 mg total) by mouth daily. 05/07/21 08/05/21  Donato Heinz, MD  spironolactone (ALDACTONE) 25 MG tablet Take 0.5 tablets (12.5 mg total) by mouth daily. 04/04/21   Donato Heinz, MD   Physical Exam: Vitals:   11/09/21 0445 11/09/21 0554 11/09/21 0800 11/09/21 0840  BP:   (!) 103/59   Pulse:   (!) 56   Resp:   16   Temp: (!) 91.7 F (33.2 C) (!) 92.3 F (33.5 C) 98.2 F (36.8 C) 98.3 F (36.8 C)  TempSrc: Rectal Axillary Oral Oral  SpO2:   100%   Weight:      Height:       Constitutional: Frail, elderly female. Eyes: PERRL, lids and conjunctivae normal ENMT: Mucous membranes and lips are dry.  Posterior pharynx clear of any exudate or lesions. Neck: normal, supple, no masses, no thyromegaly Respiratory: clear to auscultation bilaterally, no wheezing, no crackles. Normal respiratory effort. No accessory muscle use.  Cardiovascular: Regular rate in the high 60s and rhythm, no murmurs / rubs / gallops. No extremity edema. 2+ pedal pulses. No carotid bruits.  Abdomen: Obese, no distention.  Soft, no tenderness, no masses palpated. No hepatosplenomegaly. Bowel sounds positive.  Musculoskeletal: no clubbing / cyanosis.  Severe generalized weakness.  Good ROM, no contractures. Normal muscle tone.  Skin: no acute rashes, lesions, ulcers on very limited dermatological examination. Neurologic: CN 2-12 grossly intact. Sensation intact, DTR normal. Strength 5/5 in all 4.  Psychiatric: Alert and oriented x 1, knows she is in the hospital but could not specify, disoriented to.  Time, date and situation.  Labs on Admission: I have personally reviewed following labs and imaging studies  CBC: Recent Labs  Lab 11/06/21 1617 11/08/21 2357  WBC 2.8* 2.8*  NEUTROABS 2,041 2.2  HGB 9.6* 9.1*  HCT 29.3* 28.1*  MCV 81.6 83.6  PLT CANCELED 56*    Basic Metabolic Panel: Recent Labs  Lab 11/06/21 1617 11/08/21 2357  NA 143 142  K 3.7 4.5  CL 113* 115*  CO2 24 19*  GLUCOSE 148* 118*  BUN 64* 68*  CREATININE 4.06* 4.10*   CALCIUM 7.2* 7.0*   GFR: Estimated Creatinine Clearance: 13.5 mL/min (A) (by C-G formula based on SCr of 4.1 mg/dL (H)).  Liver Function Tests: Recent Labs  Lab 11/08/21 2357  AST 52*  ALT 22  ALKPHOS 154*  BILITOT 1.0  PROT 6.1*  ALBUMIN 2.2*   Radiological Exams on Admission: CT Head Wo Contrast  Result Date: 11/09/2021 CLINICAL DATA:  Mental status change. EXAM: CT HEAD WITHOUT CONTRAST TECHNIQUE: Contiguous axial images were obtained from the base of the skull through the vertex without intravenous contrast. RADIATION DOSE REDUCTION: This exam was performed according to the departmental dose-optimization program which includes automated exposure control, adjustment of the mA and/or kV according to patient size and/or use of iterative reconstruction technique. COMPARISON:  None. FINDINGS: Brain: No evidence of acute infarction, hemorrhage, hydrocephalus, extra-axial collection or mass lesion/mass effect. Vascular: No hyperdense vessel or unexpected calcification. Skull: Normal. Negative for fracture or focal lesion.  Sinuses/Orbits: No acute finding. Other: None. IMPRESSION: No acute intracranial abnormality. Electronically Signed   By: Ronney Asters M.D.   On: 11/09/2021 01:06   DG Chest Port 1 View  Result Date: 11/09/2021 CLINICAL DATA:  Weakness. EXAM: PORTABLE CHEST 1 VIEW COMPARISON:  Chest CT dated 03/05/2021. FINDINGS: Cardiomegaly with vascular congestion, edema and small bilateral pleural effusions. Pneumonia is not excluded. No pneumothorax. Atherosclerotic calcification of the aorta. No acute osseous pathology. IMPRESSION: Cardiomegaly with findings of CHF and small bilateral pleural effusions. Pneumonia is not excluded. Electronically Signed   By: Anner Crete M.D.   On: 11/09/2021 00:36    03/05/2021 echocardiogram  IMPRESSIONS:   1. Left ventricular ejection fraction, by estimation, is 45%. The left  ventricle has mildly decreased function. The left ventricle  demonstrates  global hypokinesis. There is severe left ventricular hypertrophy. Left  ventricular diastolic parameters are  consistent with Grade I diastolic dysfunction (impaired relaxation). Would  consider cardiac amyloidosis.   2. Right ventricular systolic function is normal. The right ventricular  size is normal. There is mildly elevated pulmonary artery systolic  pressure. The estimated right ventricular systolic pressure is 50.9 mmHg.   3. Left atrial size was severely dilated.   4. The mitral valve is normal in structure. Mild to moderate mitral valve  regurgitation. No evidence of mitral stenosis.   5. The aortic valve is tricuspid. Aortic valve regurgitation is trivial.  No aortic stenosis is present.   6. Aortic dilatation noted. There is mild dilatation of the aortic root,  measuring 37 mm.   7. The inferior vena cava is dilated in size with <50% respiratory  variability, suggesting right atrial pressure of 15 mmHg.   8. There is a left pleural effusion and trivial pericardial effusion.   EKG: Independently reviewed.  Vent. rate 44 BPM PR interval 169 ms QRS duration 121 ms QT/QTcB 593/508 ms P-R-T axes 151 58 51 Sinus or ectopic atrial bradycardia Probable left ventricular hypertrophy Nonspecific T abnormalities, lateral leads Prolonged QT interval  Assessment/Plan Principal Problem:   Hypothermia No clear etiology. However, urinalysis was abnormal. Continue IV cefepime. Vancomycin d/ed due to GFR/positive UA/negative PCR. Follow-up procalcitonin level in the morning. Follow blood culture and sensitivity.   Follow-up urine culture and sensitivity.  Active Problems:   Dysphagia/dysarthria MRI of the brain was negative.    Essential hypertension Hold diuretics. Continue carvedilol 6.25 mg p.o. twice daily. Monitor BP and heart rate.    AKI (acute kidney injury) ([place]) Superimposed on   Stage 3b chronic kidney disease (CKD) (HCC) Time-limited IV  fluids. Monitor intake and output. Follow-up renal function electrolytes.    Pancytopenia (HCC) Monitor CBC. Consider hematology evaluation.    Hyperlipidemia Continue rosuvastatin 10 mg p.o. daily.    Chronic combined systolic and diastolic congestive heart failure (HCC) No signs of decompensation. The patient actually looks dry. Hold furosemide, losartan and spironolactone.    Severe protein-calorie malnutrition (HCC) Protein supplementation. Nutritional services evaluation.    DVT prophylaxis: Lovenox SQ. Code Status:   Full code. Family Communication:   Disposition Plan:   Patient is from:  Home.  Anticipated DC to:  Home.  Anticipated DC date:  11/11/2021 or 11/12/2021.  Anticipated DC barriers: Clinical condition.  Consults called:   Admission status:  Inpatient/stepdown.  Severity of Illness: High severity in the setting of hypothermia, AKI superimposed on stage IIIb CKD, severe protein calorie malnutrition, chronic combined systolic and diastolic heart failure.  Reubin Milan MD [facility]  How to contact the [facility] Attending or Consulting provider [place] or covering provider during after hours [place], for this patient?   Check the care team in [facility] and look for a) attending/consulting [facility] provider listed and b) the [facility] team listed Log into [email or web address] and use [place]'s universal password to access. If you do not have the password, please contact the hospital operator. Locate the [facility] provider you are looking for under [facility] and page to a number that you can be directly reached. If you still have difficulty reaching the provider, please page the [facility] (Director on Call) for the Hospitalists listed on amion for assistance.  11/09/2021, 8:49 AM   This document was prepared using Dragon voice recognition software and may contain some unintended transcription errors.

## 2021-11-10 ENCOUNTER — Inpatient Hospital Stay (HOSPITAL_COMMUNITY): Payer: Medicare (Managed Care)

## 2021-11-10 DIAGNOSIS — R4182 Altered mental status, unspecified: Secondary | ICD-10-CM | POA: Diagnosis not present

## 2021-11-10 DIAGNOSIS — N179 Acute kidney failure, unspecified: Secondary | ICD-10-CM | POA: Diagnosis not present

## 2021-11-10 DIAGNOSIS — R55 Syncope and collapse: Secondary | ICD-10-CM

## 2021-11-10 LAB — CBC
HCT: 24.8 % — ABNORMAL LOW (ref 36.0–46.0)
Hemoglobin: 7.7 g/dL — ABNORMAL LOW (ref 12.0–15.0)
MCH: 27.1 pg (ref 26.0–34.0)
MCHC: 31 g/dL (ref 30.0–36.0)
MCV: 87.3 fL (ref 80.0–100.0)
Platelets: 46 10*3/uL — ABNORMAL LOW (ref 150–400)
RBC: 2.84 MIL/uL — ABNORMAL LOW (ref 3.87–5.11)
RDW: 18.4 % — ABNORMAL HIGH (ref 11.5–15.5)
WBC: 3.1 10*3/uL — ABNORMAL LOW (ref 4.0–10.5)
nRBC: 1.6 % — ABNORMAL HIGH (ref 0.0–0.2)

## 2021-11-10 LAB — ECHOCARDIOGRAM COMPLETE
AR max vel: 1.78 cm2
AV Area VTI: 1.73 cm2
AV Area mean vel: 1.88 cm2
AV Mean grad: 5.1 mmHg
AV Peak grad: 12.1 mmHg
Ao pk vel: 1.74 m/s
Area-P 1/2: 4.6 cm2
Calc EF: 53.8 %
Height: 65 in
P 1/2 time: 945 msec
S' Lateral: 3.3 cm
Single Plane A2C EF: 55.8 %
Single Plane A4C EF: 50.6 %
Weight: 2814.83 oz

## 2021-11-10 LAB — COMPREHENSIVE METABOLIC PANEL
ALT: 18 U/L (ref 0–44)
AST: 24 U/L (ref 15–41)
Albumin: 2.5 g/dL — ABNORMAL LOW (ref 3.5–5.0)
Alkaline Phosphatase: 115 U/L (ref 38–126)
Anion gap: 6 (ref 5–15)
BUN: 68 mg/dL — ABNORMAL HIGH (ref 8–23)
CO2: 18 mmol/L — ABNORMAL LOW (ref 22–32)
Calcium: 6.6 mg/dL — ABNORMAL LOW (ref 8.9–10.3)
Chloride: 113 mmol/L — ABNORMAL HIGH (ref 98–111)
Creatinine, Ser: 4.23 mg/dL — ABNORMAL HIGH (ref 0.44–1.00)
GFR, Estimated: 11 mL/min — ABNORMAL LOW (ref >60)
Glucose, Bld: 55 mg/dL — ABNORMAL LOW (ref 70–99)
Potassium: 3.5 mmol/L (ref 3.5–5.1)
Sodium: 137 mmol/L (ref 135–145)
Total Bilirubin: 0.7 mg/dL (ref 0.3–1.2)
Total Protein: 5.3 g/dL — ABNORMAL LOW (ref 6.5–8.1)

## 2021-11-10 LAB — URINE CULTURE: Culture: <10000 — AB

## 2021-11-10 LAB — C DIFFICILE QUICK SCREEN W PCR REFLEX
C Diff antigen: NEGATIVE
C Diff interpretation: NOT DETECTED
C Diff toxin: NEGATIVE

## 2021-11-10 LAB — GLUCOSE, CAPILLARY: Glucose-Capillary: 66 mg/dL — ABNORMAL LOW (ref 70–99)

## 2021-11-10 LAB — PROCALCITONIN: Procalcitonin: 0.59 ng/mL

## 2021-11-10 MED ORDER — LACTATED RINGERS IV SOLN: INTRAVENOUS | Status: DC

## 2021-11-10 MED ORDER — HYDRALAZINE HCL 20 MG/ML IJ SOLN
5.0000 mg | Freq: Four times a day (QID) | INTRAMUSCULAR | Status: AC | PRN
  Administered 2021-11-10 – 2021-11-11 (×2): 5 mg via INTRAVENOUS
  Filled 2021-11-10 (×2): qty 1

## 2021-11-10 MED ORDER — ALPRAZOLAM 0.25 MG PO TABS
0.2500 mg | ORAL_TABLET | Freq: Once | ORAL | Status: AC
  Administered 2021-11-10: 0.25 mg via ORAL
  Filled 2021-11-10: qty 1

## 2021-11-10 NOTE — Progress Notes (Signed)
Patient bladder scanned and highest reading was 372ml. Patient and daughter states that patient is continent of urine at home. Patient tells me that she does not feel like she has to use the restroom at this time.Patient was able to urinate via purewick during dayshift. Patient UA suspect for UTI per providers, will continue to monitor via bladder scanner will reach out to on call provider if patient is unable to urinate, bladder scanner reads >400, or bladder begins to feel full with inability to urinate.

## 2021-11-10 NOTE — Progress Notes (Signed)
°  Echocardiogram 2D Echocardiogram has been performed.  Kara Mullins M 11/10/2021, 12:18 PM

## 2021-11-10 NOTE — Progress Notes (Signed)
PROGRESS NOTE    Kara Mullins  AGT:[id number] DOB: 1951/08/24 DOA: 11/08/2021 PCP: [name], NP    Brief Narrative:  71 y.o. female with medical history significant of chronic combined systolic and diastolic heart failure, generalized anxiety disorder, hyperlipidemia, hypertension, stage IIIb CKD who was brought to the emergency department due to inability to swallow less than an hour before arrival. While in ED, pt had episode of syncope, found to be hypothermic and evidence suggesting UTI. Pt was admitted for further work up  Assessment & Plan:   Principal Problem:   Hypothermia Active Problems:   Essential hypertension   AKI (acute kidney injury) ([place])   Hyperlipidemia   Chronic combined systolic and diastolic congestive heart failure (HCC)   Stage 3b chronic kidney disease (CKD) (HCC)   Pancytopenia (HCC)   Severe protein-calorie malnutrition (HCC)  Principal Problem:   Severe sepsis with UTI Presents with hypothermia, pancytopenia, ARF, toxic metabolic encephalopathy UA reviewed, suggestive of UTI Continue IV cefepime and flagyl Procalcitonin elevated at 0.59 Follow blood culture and sensitivity.   Follow-up urine culture and sensitivity.   Active Problems:   Dysphagia/dysarthria MRI of the brain was negative.     Essential hypertension Holding diuretics. Continue carvedilol 6.25 mg p.o. twice daily. Cont to monitor BP and heart rate.     AKI (acute kidney injury) ([place]) Superimposed on   Stage 3b chronic kidney disease (CKD) ([place]) Cont IVF hydration as tolerated Will check renal US Serial bladder scan and I/o cath if needed. May need indwelling cath if pt cont to retain urine     Pancytopenia ([place]) Suspect secondary to acute illness Blood counts trending down this AM Monitor CBC. If pt continues to become more pancytopenic, then would consider hematology evaluation.     Hyperlipidemia Continue rosuvastatin 10 mg p.o. daily.     Chronic combined  systolic and diastolic congestive heart failure (HCC) No signs of decompensation. Holding furosemide, losartan and spironolactone. Continuing IVF as per above     Severe protein-calorie malnutrition (HCC) Protein supplementation. Nutritional services evaluation.    DVT prophylaxis: SCD's Code Status: Full Family Communication: Pt in room, pt's daughter at bedside  Status is: Inpatient  Remains inpatient appropriate because: Severity of illness    Consultants:    Procedures:    Antimicrobials: Anti-infectives (From admission, onward)    Start     Dose/Rate Route Frequency Ordered Stop   11/09/21 2200  ceFEPIme (MAXIPIME) 2 g in sodium chloride 0.9 % 100 mL IVPB        2 g 200 mL/hr over 30 Minutes Intravenous Every 24 hours 11/09/21 0323     11/09/21 0322  vancomycin variable dose per unstable renal function (pharmacist dosing)  Status:  Discontinued         Does not apply See admin instructions 11/09/21 0323 11/09/21 1355   11/09/21 0215  ceFEPIme (MAXIPIME) 2 g in sodium chloride 0.9 % 100 mL IVPB        2 g 200 mL/hr over 30 Minutes Intravenous  Once 11/09/21 0210 11/09/21 0302   11/09/21 0215  metroNIDAZOLE (FLAGYL) IVPB 500 mg        500 mg 100 mL/hr over 60 Minutes Intravenous Every 12 hours 11/09/21 0210 11/16/21 0214   11/09/21 0215  vancomycin (VANCOCIN) IVPB 1000 mg/200 mL premix        1,000 mg 200 mL/hr over 60 Minutes Intravenous  Once 11/09/21 0210 11/09/21 0339       Subjective: Reports some difficulty  with thinking of words to say  Objective: Vitals:   11/10/21 0459 11/10/21 0500 11/10/21 0800 11/10/21 1200  BP: (!) 179/86 (!) 179/86  (!) 149/70  Pulse:  (!) 49  (!) 48  Resp:  17  15  Temp:   (!) 94 F (34.4 C)   TempSrc:   Rectal   SpO2:  100%  100%  Weight:      Height:        Intake/Output Summary (Last 24 hours) at 11/10/2021 1502 Last data filed at 11/10/2021 1540 Gross per 24 hour  Intake 1883.18 ml  Output 700 ml  Net 1183.18 ml    Filed Weights   11/09/21 0330  Weight: 79.8 kg    Examination: General exam: Awake, laying in bed, in nad Respiratory system: Normal respiratory effort, no wheezing Cardiovascular system: regular rate, s1, s2 Gastrointestinal system: Soft, nondistended, positive BS Central nervous system: CN2-12 grossly intact, strength intact Extremities: Perfused, no clubbing Skin: Normal skin turgor, no notable skin lesions seen Psychiatry: Mood normal // no visual hallucinations   Data Reviewed: I have personally reviewed following labs and imaging studies  CBC: Recent Labs  Lab 11/06/21 1617 11/08/21 2357 11/10/21 0240  WBC 2.8* 2.8* 3.1*  NEUTROABS 2,041 2.2  --   HGB 9.6* 9.1* 7.7*  HCT 29.3* 28.1* 24.8*  MCV 81.6 83.6 87.3  PLT CANCELED 56* 46*   Basic Metabolic Panel: Recent Labs  Lab 11/06/21 1617 11/08/21 2357 11/10/21 0240  NA 143 142 137  K 3.7 4.5 3.5  CL 113* 115* 113*  CO2 24 19* 18*  GLUCOSE 148* 118* 55*  BUN 64* 68* 68*  CREATININE 4.06* 4.10* 4.23*  CALCIUM 7.2* 7.0* 6.6*   GFR: Estimated Creatinine Clearance: 13.1 mL/min (A) (by C-G formula based on SCr of 4.23 mg/dL (H)). Liver Function Tests: Recent Labs  Lab 11/08/21 2357 11/10/21 0240  AST 52* 24  ALT 22 18  ALKPHOS 154* 115  BILITOT 1.0 0.7  PROT 6.1* 5.3*  ALBUMIN 2.2* 2.5*   Recent Labs  Lab 11/08/21 2357  LIPASE 65*   No results for input(s): AMMONIA in the last 168 hours. Coagulation Profile: No results for input(s): INR, PROTIME in the last 168 hours. Cardiac Enzymes: No results for input(s): CKTOTAL, CKMB, CKMBINDEX, TROPONINI in the last 168 hours. BNP (last 3 results) No results for input(s): PROBNP in the last 8760 hours. HbA1C: No results for input(s): HGBA1C in the last 72 hours. CBG: Recent Labs  Lab 11/08/21 2358 11/10/21 0902  GLUCAP 113* 66*   Lipid Profile: No results for input(s): CHOL, HDL, LDLCALC, TRIG, CHOLHDL, LDLDIRECT in the last 72 hours. Thyroid  Function Tests: Recent Labs    11/09/21 0022  TSH 3.941   Anemia Panel: No results for input(s): VITAMINB12, FOLATE, FERRITIN, TIBC, IRON, RETICCTPCT in the last 72 hours. Sepsis Labs: Recent Labs  Lab 11/09/21 0022 11/09/21 0159 11/09/21 0210 11/10/21 0240  PROCALCITON  --   --  <0.10 0.59  LATICACIDVEN 0.5 0.5  --   --     Recent Results (from the past 240 hour(s))  Resp Panel by RT-PCR (Flu A&B, Covid) Nasopharyngeal Swab     Status: None   Collection Time: 11/09/21 12:13 AM   Specimen: Nasopharyngeal Swab; Nasopharyngeal(NP) swabs in vial transport medium  Result Value Ref Range Status   SARS Coronavirus 2 by RT PCR NEGATIVE NEGATIVE Final    Comment: (NOTE) SARS-CoV-2 target nucleic acids are NOT DETECTED.  The SARS-CoV-2 RNA is  generally detectable in upper respiratory specimens during the acute phase of infection. The lowest concentration of SARS-CoV-2 viral copies this assay can detect is 138 copies/mL. A negative result does not preclude SARS-Cov-2 infection and should not be used as the sole basis for treatment or other patient management decisions. A negative result may occur with  improper specimen collection/handling, submission of specimen other than nasopharyngeal swab, presence of viral mutation(s) within the areas targeted by this assay, and inadequate number of viral copies(<138 copies/mL). A negative result must be combined with clinical observations, patient history, and epidemiological information. The expected result is Negative.  Fact Sheet for Patients:  EntrepreneurPulse.com.au  Fact Sheet for Healthcare Providers:  IncredibleEmployment.be  This test is no t yet approved or cleared by the [place] FDA and  has been authorized for detection and/or diagnosis of SARS-CoV-2 by FDA under an Emergency Use Authorization (EUA). This EUA will remain  in effect (meaning this test can be used) for the duration of  the COVID-19 declaration under Section 564(b)(1) of the Act, 21 U.S.C.section 360bbb-3(b)(1), unless the authorization is terminated  or revoked sooner.       Influenza A by PCR NEGATIVE NEGATIVE Final   Influenza B by PCR NEGATIVE NEGATIVE Final    Comment: (NOTE) The Xpert Xpress SARS-CoV-2/FLU/RSV plus assay is intended as an aid in the diagnosis of influenza from Nasopharyngeal swab specimens and should not be used as a sole basis for treatment. Nasal washings and aspirates are unacceptable for Xpert Xpress SARS-CoV-2/FLU/RSV testing.  Fact Sheet for Patients: EntrepreneurPulse.com.au  Fact Sheet for Healthcare Providers: IncredibleEmployment.be  This test is not yet approved or cleared by the [place] FDA and has been authorized for detection and/or diagnosis of SARS-CoV-2 by FDA under an Emergency Use Authorization (EUA). This EUA will remain in effect (meaning this test can be used) for the duration of the COVID-19 declaration under Section 564(b)(1) of the Act, 21 U.S.C. section 360bbb-3(b)(1), unless the authorization is terminated or revoked.  Performed at [facility], [place] [address] [place], [place] [address]   MRSA Next Gen by PCR, Nasal     Status: None   Collection Time: 11/09/21  2:29 AM   Specimen: Nasal Mucosa; Nasal Swab  Result Value Ref Range Status   MRSA by PCR Next Gen NOT DETECTED NOT DETECTED Final    Comment: (NOTE) The GeneXpert MRSA Assay (FDA approved for NASAL specimens only), is one component of a comprehensive MRSA colonization surveillance program. It is not intended to diagnose MRSA infection nor to guide or monitor treatment for MRSA infections. Test performance is not FDA approved in patients less than 54 years old. Performed at [facility], [place] [address], [place], [place] [address]   C Difficile Quick Screen w PCR reflex     Status: None   Collection  Time: 11/10/21 11:08 AM   Specimen: STOOL  Result Value Ref Range Status   C Diff antigen NEGATIVE NEGATIVE Final   C Diff toxin NEGATIVE NEGATIVE Final   C Diff interpretation No C. difficile detected.  Final    Comment: Performed at [facility], [place] [address], [place], [place] [address]     Radiology Studies: CT Head Wo Contrast  Result Date: 11/09/2021 CLINICAL DATA:  Mental status change. EXAM: CT HEAD WITHOUT CONTRAST TECHNIQUE: Contiguous axial images were obtained from the base of the skull through the vertex without intravenous contrast. RADIATION DOSE REDUCTION: This exam was performed according to  the departmental dose-optimization program which includes automated exposure control, adjustment of the mA and/or kV according to patient size and/or use of iterative reconstruction technique. COMPARISON:  None. FINDINGS: Brain: No evidence of acute infarction, hemorrhage, hydrocephalus, extra-axial collection or mass lesion/mass effect. Vascular: No hyperdense vessel or unexpected calcification. Skull: Normal. Negative for fracture or focal lesion. Sinuses/Orbits: No acute finding. Other: None. IMPRESSION: No acute intracranial abnormality. Electronically Signed   By: Ronney Asters M.D.   On: 11/09/2021 01:06   MR BRAIN WO CONTRAST  Result Date: 11/09/2021 CLINICAL DATA:  Mental status change, unknown cause; dysphagia and motor aphasia EXAM: MRI HEAD WITHOUT CONTRAST TECHNIQUE: Multiplanar, multiecho pulse sequences of the brain and surrounding structures were obtained without intravenous contrast. COMPARISON:  None. FINDINGS: Brain: There is no acute infarction or intracranial hemorrhage. There is no intracranial mass, mass effect, or edema. There is no hydrocephalus or extra-axial fluid collection. Ventricles and sulci are normal in size and configuration. Minimal small foci of T2 hyperintensity in the supratentorial white matter may reflect minor chronic microvascular  ischemic changes. Vascular: Major vessel flow voids at the skull base are preserved. Skull and upper cervical spine: Normal marrow signal is preserved. Sinuses/Orbits: Paranasal sinuses are aerated. Bilateral lens replacements. Other: Sella is unremarkable.  Mastoid air cells are clear. IMPRESSION: No acute infarction, hemorrhage, or mass. Electronically Signed   By: Macy Mis M.D.   On: 11/09/2021 14:43   DG Chest Port 1 View  Result Date: 11/09/2021 CLINICAL DATA:  Weakness. EXAM: PORTABLE CHEST 1 VIEW COMPARISON:  Chest CT dated 03/05/2021. FINDINGS: Cardiomegaly with vascular congestion, edema and small bilateral pleural effusions. Pneumonia is not excluded. No pneumothorax. Atherosclerotic calcification of the aorta. No acute osseous pathology. IMPRESSION: Cardiomegaly with findings of CHF and small bilateral pleural effusions. Pneumonia is not excluded. Electronically Signed   By: Anner Crete M.D.   On: 11/09/2021 00:36   ECHOCARDIOGRAM COMPLETE  Result Date: 11/10/2021    ECHOCARDIOGRAM REPORT   Patient Name:   Kara Mullins Date of Exam: 11/10/2021 Medical Rec #:  [id number]      Height:       65.0 in Accession #:    [id number]     Weight:       175.9 lb Date of Birth:  1951/01/08     BSA:          1.873 m Patient Age:    31 years       BP:           179/86 mmHg Patient Gender: F              HR:           50 bpm. Exam Location:  Inpatient Procedure: 2D Echo, 3D Echo, Cardiac Doppler, Color Doppler and Strain Analysis Indications:    Syncope R55  History:        Patient has prior history of Echocardiogram examinations, most                 recent 03/05/2021. CHF; Risk Factors:Hypertension and                 Dyslipidemia. Chronic kidney disease.  Sonographer:    Darlina Sicilian RDCS Referring Phys: [id number] DAVID MANUEL [place]  1. Left ventricular ejection fraction, by estimation, is 55 to 60%. The left ventricle has normal function. The left ventricle has no regional wall motion  abnormalities. There is moderate left ventricular hypertrophy. Left ventricular diastolic parameters are consistent  with Grade II diastolic dysfunction (pseudonormalization). Elevated left atrial pressure.  2. Right ventricular systolic function is normal. The right ventricular size is normal. There is normal pulmonary artery systolic pressure.  3. Left atrial size was mildly dilated.  4. Moderate pleural effusion in the left lateral region.  5. The mitral valve is abnormal. Mild mitral valve regurgitation. No evidence of mitral stenosis.  6. The tricuspid valve is abnormal.  7. The aortic valve is tricuspid. There is mild calcification of the aortic valve. There is mild thickening of the aortic valve. Aortic valve regurgitation is mild. No aortic stenosis is present.  8. The inferior vena cava is normal in size with greater than 50% respiratory variability, suggesting right atrial pressure of 3 mmHg. FINDINGS  Left Ventricle: Left ventricular ejection fraction, by estimation, is 55 to 60%. The left ventricle has normal function. The left ventricle has no regional wall motion abnormalities. Global longitudinal strain performed but not reported based on interpreter judgement due to suboptimal tracking. The left ventricular internal cavity size was normal in size. There is moderate left ventricular hypertrophy. Left ventricular diastolic parameters are consistent with Grade II diastolic dysfunction (pseudonormalization). Elevated left atrial pressure. Right Ventricle: The right ventricular size is normal. No increase in right ventricular wall thickness. Right ventricular systolic function is normal. There is normal pulmonary artery systolic pressure. The tricuspid regurgitant velocity is 2.42 m/s, and  with an assumed right atrial pressure of 3 mmHg, the estimated right ventricular systolic pressure is 26.3 mmHg. Left Atrium: Left atrial size was mildly dilated. Right Atrium: Right atrial size was normal in size.  Pericardium: There is no evidence of pericardial effusion. Mitral Valve: The mitral valve is abnormal. There is mild thickening of the mitral valve leaflet(s). There is mild calcification of the mitral valve leaflet(s). Mild mitral annular calcification. Mild mitral valve regurgitation. No evidence of mitral valve stenosis. Tricuspid Valve: The tricuspid valve is abnormal. Tricuspid valve regurgitation is mild . No evidence of tricuspid stenosis. Aortic Valve: The aortic valve is tricuspid. There is mild calcification of the aortic valve. There is mild thickening of the aortic valve. There is mild aortic valve annular calcification. Aortic valve regurgitation is mild. Aortic regurgitation PHT measures 945 msec. No aortic stenosis is present. Aortic valve mean gradient measures 5.1 mmHg. Aortic valve peak gradient measures 12.1 mmHg. Aortic valve area, by VTI measures 1.73 cm. Pulmonic Valve: The pulmonic valve was not well visualized. Pulmonic valve regurgitation is trivial. No evidence of pulmonic stenosis. Aorta: The aortic root is normal in size and structure. Venous: The inferior vena cava is normal in size with greater than 50% respiratory variability, suggesting right atrial pressure of 3 mmHg. IAS/Shunts: No atrial level shunt detected by color flow Doppler. Additional Comments: There is a moderate pleural effusion in the left lateral region. Moderate ascites is present.  LEFT VENTRICLE PLAX 2D LVIDd:         5.00 cm      Diastology LVIDs:         3.30 cm      LV e' medial:    2.93 cm/s LV PW:         1.40 cm      LV E/e' medial:  26.8 LV IVS:        1.40 cm      LV e' lateral:   3.38 cm/s LVOT diam:     1.90 cm      LV E/e' lateral: 23.2 LV SV:  78 LV SV Index:   41 LVOT Area:     2.84 cm                              3D Volume EF: LV Volumes (MOD)            3D EF:        54 % LV vol d, MOD A2C: 157.0 ml LV EDV:       178 ml LV vol d, MOD A4C: 144.0 ml LV ESV:       82 ml LV vol s, MOD A2C: 69.4 ml   LV SV:        96 ml LV vol s, MOD A4C: 71.2 ml LV SV MOD A2C:     87.6 ml LV SV MOD A4C:     144.0 ml LV SV MOD BP:      82.2 ml RIGHT VENTRICLE RV S prime:     10.60 cm/s TAPSE (M-mode): 2.2 cm LEFT ATRIUM             Index        RIGHT ATRIUM           Index LA diam:        3.40 cm 1.82 cm/m   RA Area:     14.40 cm LA Vol (A2C):   74.9 ml 39.99 ml/m  RA Volume:   33.00 ml  17.62 ml/m LA Vol (A4C):   76.7 ml 40.95 ml/m LA Biplane Vol: 76.5 ml 40.84 ml/m  AORTIC VALVE AV Area (Vmax):    1.78 cm AV Area (Vmean):   1.88 cm AV Area (VTI):     1.73 cm AV Vmax:           173.74 cm/s AV Vmean:          101.014 cm/s AV VTI:            0.448 m AV Peak Grad:      12.1 mmHg AV Mean Grad:      5.1 mmHg LVOT Vmax:         109.00 cm/s LVOT Vmean:        66.900 cm/s LVOT VTI:          0.274 m LVOT/AV VTI ratio: 0.61 AI PHT:            945 msec  AORTA Ao Root diam: 3.60 cm Ao Asc diam:  3.00 cm MITRAL VALVE               TRICUSPID VALVE MV Area (PHT):  4.60 cm   TR Peak grad:   23.4 mmHg MV Area (plan): 6.97 cm   TR Vmax:        242.00 cm/s MV Decel Time:  165 msec MV E velocity: 78.40 cm/s  SHUNTS MV A velocity: 66.40 cm/s  Systemic VTI:  0.27 m MV E/A ratio:  1.18        Systemic Diam: 1.90 cm Carlyle Dolly MD Electronically signed by Carlyle Dolly MD Signature Date/Time: 11/10/2021/1:30:51 PM    Final     Scheduled Meds:  carvedilol  6.25 mg Oral BID WC   Chlorhexidine Gluconate Cloth  6 each Topical Daily   mouth rinse  15 mL Mouth Rinse BID   rosuvastatin  10 mg Oral Daily   Continuous Infusions:  ceFEPime (MAXIPIME) IV Stopped (11/09/21 2201)   metronidazole 500 mg (11/10/21 1437)  LOS: 1 day   Marylu Lund, MD [facility] Pager On Amion  If 7PM-7AM, please contact night-coverage 11/10/2021, 3:02 PM

## 2021-11-11 ENCOUNTER — Encounter (HOSPITAL_COMMUNITY): Payer: Self-pay | Admitting: Pulmonary Disease

## 2021-11-11 DIAGNOSIS — R4182 Altered mental status, unspecified: Secondary | ICD-10-CM | POA: Diagnosis not present

## 2021-11-11 DIAGNOSIS — N179 Acute kidney failure, unspecified: Secondary | ICD-10-CM | POA: Diagnosis not present

## 2021-11-11 LAB — COMPREHENSIVE METABOLIC PANEL
ALT: 17 U/L (ref 0–44)
AST: 25 U/L (ref 15–41)
Albumin: 2.2 g/dL — ABNORMAL LOW (ref 3.5–5.0)
Alkaline Phosphatase: 124 U/L (ref 38–126)
Anion gap: 8 (ref 5–15)
BUN: 70 mg/dL — ABNORMAL HIGH (ref 8–23)
CO2: 18 mmol/L — ABNORMAL LOW (ref 22–32)
Calcium: 6.7 mg/dL — ABNORMAL LOW (ref 8.9–10.3)
Chloride: 113 mmol/L — ABNORMAL HIGH (ref 98–111)
Creatinine, Ser: 4.33 mg/dL — ABNORMAL HIGH (ref 0.44–1.00)
GFR, Estimated: 11 mL/min — ABNORMAL LOW (ref >60)
Glucose, Bld: 80 mg/dL (ref 70–99)
Potassium: 3.3 mmol/L — ABNORMAL LOW (ref 3.5–5.1)
Sodium: 139 mmol/L (ref 135–145)
Total Bilirubin: 0.7 mg/dL (ref 0.3–1.2)
Total Protein: 5.4 g/dL — ABNORMAL LOW (ref 6.5–8.1)

## 2021-11-11 LAB — CBC
HCT: 25.8 % — ABNORMAL LOW (ref 36.0–46.0)
Hemoglobin: 8.4 g/dL — ABNORMAL LOW (ref 12.0–15.0)
MCH: 27.3 pg (ref 26.0–34.0)
MCHC: 32.6 g/dL (ref 30.0–36.0)
MCV: 83.8 fL (ref 80.0–100.0)
Platelets: 53 10*3/uL — ABNORMAL LOW (ref 150–400)
RBC: 3.08 MIL/uL — ABNORMAL LOW (ref 3.87–5.11)
RDW: 18.1 % — ABNORMAL HIGH (ref 11.5–15.5)
WBC: 3.8 10*3/uL — ABNORMAL LOW (ref 4.0–10.5)
nRBC: 1.1 % — ABNORMAL HIGH (ref 0.0–0.2)

## 2021-11-11 LAB — PROCALCITONIN: Procalcitonin: 0.29 ng/mL

## 2021-11-11 LAB — T3: T3, Total: 37 ng/dL — ABNORMAL LOW (ref 71–180)

## 2021-11-11 MED ORDER — POTASSIUM CHLORIDE CRYS ER 20 MEQ PO TBCR
40.0000 meq | EXTENDED_RELEASE_TABLET | Freq: Once | ORAL | Status: AC
  Administered 2021-11-11: 40 meq via ORAL
  Filled 2021-11-11: qty 2

## 2021-11-11 MED ORDER — HYDRALAZINE HCL 20 MG/ML IJ SOLN
5.0000 mg | INTRAMUSCULAR | Status: DC | PRN
  Administered 2021-11-12 – 2021-11-17 (×7): 5 mg via INTRAVENOUS
  Filled 2021-11-11 (×8): qty 1

## 2021-11-11 MED ORDER — LORAZEPAM 2 MG/ML IJ SOLN
0.5000 mg | Freq: Once | INTRAMUSCULAR | Status: AC
  Administered 2021-11-11: 0.5 mg via INTRAVENOUS
  Filled 2021-11-11: qty 1

## 2021-11-11 MED ORDER — ALPRAZOLAM 0.25 MG PO TABS
0.2500 mg | ORAL_TABLET | Freq: Two times a day (BID) | ORAL | Status: DC | PRN
  Administered 2021-11-11 – 2021-11-13 (×3): 0.25 mg via ORAL
  Filled 2021-11-11 (×3): qty 1

## 2021-11-11 MED ORDER — ATROPINE SULFATE 1 MG/10ML IJ SOSY
PREFILLED_SYRINGE | INTRAMUSCULAR | Status: AC
  Filled 2021-11-11: qty 10

## 2021-11-11 NOTE — Evaluation (Signed)
Occupational Therapy Evaluation Patient Details Name: Kara Mullins MRN: [id number] DOB: August 21, 1951 Today's Date: 11/11/2021   History of Present Illness 71 y.o. female with medical history significant of chronic combined systolic and diastolic heart failure, generalized anxiety disorder, hyperlipidemia, hypertension, stage IIIb CKD who was brought to the emergency department due to inability to swallow less than an hour before arrival. While in ED, pt had episode of syncope, found to be hypothermic and evidence suggesting UTI. Dx of sepsis.   Clinical Impression   Kara Mullins is a 71 year old woman admitted to hospital with sepsis. On evaluation she demonstrates generalized weakness, decreased activity tolerance, impaired balance and confusion resulting in a decline in functional abilities. Patient min assist for transfers and needing increased assistance with ADLs. Mobility limited to transfer to recliner and marching in place due to unsteadiness. Patient will benefit from skilled OT services while in hospital to improve deficits and learn compensatory strategies as needed in order to return to PLOF.  Recommend [facility] services at discharge as long as patient maintains or progresses towards goals.      Recommendations for follow up therapy are one component of a multi-disciplinary discharge planning process, led by the attending physician.  Recommendations may be updated based on patient status, additional functional criteria and insurance authorization.   Follow Up Recommendations  Home health OT    Assistance Recommended at Discharge Frequent or constant Supervision/Assistance  Patient can return home with the following A little help with walking and/or transfers;A little help with bathing/dressing/bathroom;Assistance with cooking/housework    Functional Status Assessment  Patient has had a recent decline in their functional status and demonstrates the ability to make significant  improvements in function in a reasonable and predictable amount of time.  Equipment Recommendations  Other (comment) (TBD)    Recommendations for Other Services       Precautions / Restrictions Precautions Precautions: Fall Precaution Comments: pt/daughter report one loss of balance ~3 weeks ago, pt slid against a wall but didn't fall to floor Restrictions Weight Bearing Restrictions: No      Mobility Bed Mobility                    Transfers                          Balance Overall balance assessment: Needs assistance Sitting-balance support: No upper extremity supported, Feet supported Sitting balance-Leahy Scale: Fair     Standing balance support: Bilateral upper extremity supported Standing balance-Leahy Scale: Poor Standing balance comment: reliant on walker                           ADL either performed or assessed with clinical judgement   ADL Overall ADL's : Needs assistance/impaired Eating/Feeding: Set up;Sitting   Grooming: Set up;Sitting   Upper Body Bathing: Set up;Sitting   Lower Body Bathing: Moderate assistance;Sit to/from stand   Upper Body Dressing : Set up;Sitting   Lower Body Dressing: Moderate assistance;Sit to/from stand   Toilet Transfer: Minimal assistance;BSC/3in1;Rolling walker (2 wheels)   Toileting- Clothing Manipulation and Hygiene: Moderate assistance;Sit to/from stand       Functional mobility during ADLs: Minimal assistance;Rolling walker (2 wheels) General ADL Comments: Patient supervision to transfer to side of bed. Min guard for standing needing increased to power up using RW. Min assist to take steps to the recliner - began to leave her right foot  behind and did not control her descent in to the recliner. Patient stood again with min guard and then able to march in place with better stability and control     Vision   Vision Assessment?: No apparent visual deficits     Perception     Praxis       Pertinent Vitals/Pain Pain Assessment Pain Assessment: No/denies pain     Hand Dominance     Extremity/Trunk Assessment Upper Extremity Assessment Upper Extremity Assessment: RUE deficits/detail;LUE deficits/detail RUE Deficits / Details: WFL ROM, shoulder 4/5 shoulder strength,5/5 elbow strength, wrist 5/5, grip 4/5 RUE Sensation: WNL RUE Coordination: WNL LUE Deficits / Details: WFL ROM, shoulder 4/5 shoulder strength,5/5 elbow strength, wrist 5/5, grip 4/5 LUE Sensation: WNL LUE Coordination: WNL   Lower Extremity Assessment Lower Extremity Assessment: Defer to PT evaluation   Cervical / Trunk Assessment Cervical / Trunk Assessment: Normal   Communication Communication Communication: No difficulties   Cognition Arousal/Alertness: Awake/alert Behavior During Therapy: WFL for tasks assessed/performed Overall Cognitive Status: Impaired/Different from baseline                                 General Comments: Alert to self and knows she is in the hospital. Knows it is 2023 but states the wrong month. Appears to have some confusion     General Comments       Exercises     Shoulder Instructions      Home Living Family/patient expects to be discharged to:: Private residence Living Arrangements: Children Available Help at Discharge: Family;Available PRN/intermittently   Home Access: Stairs to enter Entrance Stairs-Number of Steps: 2   Home Layout: One level         Bathroom Toilet: Handicapped height     Home Equipment: Rollator (4 wheels)   Additional Comments: was sponge bathing, lives with daughter who works      Prior Functioning/Environment Prior Level of Function : Independent/Modified Independent             Mobility Comments: doesn't drive, works at a hotel 4 hrs/day in mornings ADLs Comments: independent sponge baths        OT Problem List: Decreased strength;Decreased activity tolerance;Impaired balance (sitting  and/or standing);Decreased cognition;Decreased safety awareness;Decreased knowledge of use of DME or AE;Obesity      OT Treatment/Interventions: Self-care/ADL training;Therapeutic exercise;DME and/or AE instruction;Therapeutic activities;Balance training;Patient/family education    OT Goals(Current goals can be found in the care plan section) Acute Rehab OT Goals Patient Stated Goal: wants to go back to work OT Goal Formulation: With patient Time For Goal Achievement: 11/25/21 Potential to Achieve Goals: Good  OT Frequency: Min 2X/week    Co-evaluation              AM-PAC OT "6 Clicks" Daily Activity     Outcome Measure Help from another person eating meals?: A Little Help from another person taking care of personal grooming?: A Little Help from another person toileting, which includes using toliet, bedpan, or urinal?: A Lot Help from another person bathing (including washing, rinsing, drying)?: A Lot Help from another person to put on and taking off regular upper body clothing?: A Little Help from another person to put on and taking off regular lower body clothing?: A Lot 6 Click Score: 15   End of Session Equipment Utilized During Treatment: Rolling walker (2 wheels) Nurse Communication: Mobility status  Activity Tolerance: Patient tolerated treatment well Patient  left: in chair;with call bell/phone within reach;with chair alarm set  OT Visit Diagnosis: Unsteadiness on feet (R26.81);Muscle weakness (generalized) (M62.81)                Time: 1010-1030 OT Time Calculation (min): 20 min Charges:  OT General Charges $OT Visit: 1 Visit OT Evaluation $OT Eval Low Complexity: 1 Low  Anastasios Melander, OTR/L [place]  Office [phone] Pager: [place] 11/11/2021, 12:54 PM

## 2021-11-11 NOTE — Consult Note (Addendum)
Renal Service Consult Note [facility]  Erin Obando 11/11/2021 Sol Blazing, MD Requesting Physician: Dr. Wyline Copas  Reason for Consult: Renal failure HPI: The patient is a 71 y.o. year-old w/ hx of combined syst/ diast CHF, anxiety, HTN, HL, CKD 3b who presented to ED w/ AMS, poor swallowing and trouble verbalizing words. Less active and interactive over the last week approximately. In ER waiting she had presyncopal event. In ED BP 181/90, RR16 HR 41, temp 87.5deg. She was given a warming blanket, 500 cc bolus and IV cefepime, flagyl and vancomycin. UA looked dirty, wBC 2.8K, Hb 8, creat 4.1, alb 2.2.  CXR showed findings of CHF/ edema, vs poss pna, and small effusions. She was admitted for hypothermia and kept on IV cefepime. IV vanc was dc'd w/ neg PCR, low GFR and +UA. MRI brain was negative. For AKI losartan, aldactone, lasix were held. The pt looked dry and IVF's were given.  The next morning 1/28 creat was 4.2 and today creat is 4.33. UOP has improved 225 on 1/27 and 1075 cc yesterday. Asked to see for renal failure.      Pt is a poor historian. Seen in ICU. Daughter at bedside.  States her mother has had declining health the last year or so.     Pt was admitted May 2022 for abd pain, LE edema and SOB. Her echo showed LVEF 45%, severe LVH and concern for cardiac amyloid. W/u showed + abnormal light chains and hematology was consulted and pt underwent a BM biopsy. She had AKI , was diuresed and then dc'd on po lasix. Cardiac f/u showed cardiac MRI done 04/30/21 showed no evidence of cardiac amyloidosis, LVEF 53%.     During the May 2022 admission pt seen by urology as well. A renal US done for creat 1.3 showed hydronephrosis. A CT scan was ordered and showed a dilated L renal pelvis. Urology felt that the pt had a UPJ obstruction on the left with no hydroureter. They felt this was likely chronic on the left w/o sig loss of parenchyma or evidence of clinical significance. They  recommended no additional Rx for her dilated L kidney/ UPJ obstruction.    ROS - n/a   Past Medical History  Past Medical History:  Diagnosis Date   Chronic combined systolic and diastolic congestive heart failure ([place]) 11/09/2021   Generalized anxiety disorder    Hyperlipidemia 11/09/2021   Hypertension    Stage 3b chronic kidney disease (CKD) ([place]) 11/09/2021   Past Surgical History  Past Surgical History:  Procedure Laterality Date   CATARACT EXTRACTION, BILATERAL     combined systolic and diastolic congestive heart failure      Family History  Family History  Problem Relation Age of Onset   Throat cancer Father    Social History  reports that she has never smoked. She has never used smokeless tobacco. She reports that she does not currently use alcohol. She reports that she does not use drugs. Allergies No Known Allergies Home medications Prior to Admission medications   Medication Sig Start Date End Date Taking? Authorizing Provider  carvedilol (COREG) 6.25 MG tablet Take 1 tablet (6.25 mg total) by mouth 2 (two) times daily with a meal. 11/06/21 02/04/22 Yes [name], [name], NP  furosemide (LASIX) 40 MG tablet Take 1 tablet (40 mg total) by mouth daily. 04/04/21  Yes Donato Heinz, MD  losartan (COZAAR) 25 MG tablet Take 25 mg by mouth daily.   Yes [provider]  rosuvastatin (CRESTOR) 10 MG tablet Take 1 tablet (10 mg total) by mouth daily. 05/07/21 11/09/21 Yes Donato Heinz, MD  spironolactone (ALDACTONE) 25 MG tablet Take 0.5 tablets (12.5 mg total) by mouth daily. 04/04/21  Yes Donato Heinz, MD  Iron, Ferrous Sulfate, 325 (65 Fe) MG TABS Take 325 mg by mouth once for 1 dose. Patient not taking: Reported on 11/09/2021 05/03/21 05/03/21  [name], [name], NP     Vitals:   11/11/21 0900 11/11/21 1000 11/11/21 1200 11/11/21 1300  BP: (!) 155/70  (!) 169/87 (!) 182/80  Pulse: (!) 48 (!) 46 (!) 39 (!) 41  Resp: 14 12 (!) 9 12  Temp:       TempSrc:      SpO2: 100% 100% 100% 100%  Weight:      Height:       Exam Gen frail older adult, up in the chair, O x 1 No rash, cyanosis or gangrene Sclera anicteric, throat clear and moist  No jvd or bruits, flat neck veins Chest rales L base, R clear RRR no RG  Abd soft ntnd no mass or ascites +bs GU  defer MS obese LE's w/ 1-2+ pitting hip edema, trace pretib edema Neuro is alert, Ox 1, alert , calm and pleasant   Home meds include - coreg , lasix 40 qd, losartan 25, crestor, aldactone , fe SO4     UA 1/27 - mod Hb, >300 prot , many bact, 6-10 rbc, >50 wbc    Na 133 CO2 18  BUN 70  Cr 4.33  LFT's ok   GFR 11 ml/min     WBC 3K  Hb 8.4  plt 53k      REnal US - compared to May 2022 [place] >> IMPRESSION: 1. Persistent mild right hydronephrosis. 2. Persistent moderate left hydronephrosis.    Assessment/ Plan: AKI on CKD 3b - b/l creat 1.3- 1.6, eGFR 33- 36 ml/min from mid 2022.  Last creat 1.98 in Nov 2022. Pt admitted 1/26 w/ AMS, hypothermia, pancytopenia and creatinine 4.1, thought to be sepsis d/t UTI or PNA. Diuretics, ARB held and IVF's given. Creat no better but pt is better and more alert, and UOP is improving. Foley to be placed today for urinary retention. Renal US showed persistent bilat hydronephrosis similar to may 2022 renal US. UA shows proteinuria, pyuria w/ clumps. AKI possibly due to ATN, prerenal, vol depletion, +/- hydronephrosis. Pt is frail here, poor mentation, not sure her baseline, daughter states she has declined over the last 1-2 yrs. Has refused procedure in the last year. At this time would likely be a poor HD candidate, will follow as she hopefully gets better. Recommend cont IVF at 75 cc/hr. Placing foley. Will follow.  Pancytopenia - pt had +M spike during anemia work-up may 2022. BM biopsy done showed 7% plasma cells. Could have myeloma.  Sepsis/ hypothermia - possible UTI, vs PNA. Temps have normalized, on broad-spec IV abx, per pmd HTN - BP's not low,  high if anything. Avoid acei/ ARB for now for BP lowering.  Chronic combined syst/ diast CHF      Rob Garek Schuneman  MD 11/11/2021, 3:10 PM  Recent Labs  Lab 11/10/21 0240 11/11/21 0243  WBC 3.1* 3.8*  HGB 7.7* 8.4*   Recent Labs  Lab 11/10/21 0240 11/11/21 0243  K 3.5 3.3*  BUN 68* 70*  CREATININE 4.23* 4.33*  ALBUMIN 2.5* 2.2*  CALCIUM 6.6* 6.7*

## 2021-11-11 NOTE — Plan of Care (Signed)
°  Problem: Education: Goal: Knowledge of General Education information will improve Description: Including pain rating scale, medication(s)/side effects and non-pharmacologic comfort measures Outcome: Progressing   Problem: Health Behavior/Discharge Planning: Goal: Ability to manage health-related needs will improve Outcome: Progressing   Problem: Clinical Measurements: Goal: Ability to maintain clinical measurements within normal limits will improve Outcome: Progressing   Problem: Clinical Measurements: Goal: Respiratory complications will improve Outcome: Progressing   Problem: Activity: Goal: Risk for activity intolerance will decrease Outcome: Progressing   Problem: Nutrition: Goal: Adequate nutrition will be maintained Outcome: Progressing   Problem: Coping: Goal: Level of anxiety will decrease Outcome: Progressing   Problem: Elimination: Goal: Will not experience complications related to bowel motility Outcome: Progressing   Problem: Elimination: Goal: Will not experience complications related to urinary retention Outcome: Progressing

## 2021-11-11 NOTE — Evaluation (Signed)
Physical Therapy Evaluation Patient Details Name: Kara Mullins MRN: [id number] DOB: 28-Apr-1951 Today's Date: 11/11/2021  History of Present Illness  71 y.o. female with medical history significant of chronic combined systolic and diastolic heart failure, generalized anxiety disorder, hyperlipidemia, hypertension, stage IIIb CKD who was brought to the emergency department due to inability to swallow less than an hour before arrival. While in ED, pt had episode of syncope, found to be hypothermic and evidence suggesting UTI. Dx of sepsis.  Clinical Impression  Pt admitted with above diagnosis. Pt ambulated 67' with RW with assistance for balance, HR 42 at rest, HR 50 with walking, pt denied dizziness. Pt is quite unsteady in standing and requires hands on assist at present when in standing. Daughter would like to care for pt at home. HHPT and WC recommended for DC.  Pt currently with functional limitations due to the deficits listed below (see PT Problem List). Pt will benefit from skilled PT to increase their independence and safety with mobility to allow discharge to the venue listed below.          Recommendations for follow up therapy are one component of a multi-disciplinary discharge planning process, led by the attending physician.  Recommendations may be updated based on patient status, additional functional criteria and insurance authorization.  Follow Up Recommendations Home health PT    Assistance Recommended at Discharge Intermittent Supervision/Assistance  Patient can return home with the following  A little help with walking and/or transfers;Assistance with cooking/housework;A little help with bathing/dressing/bathroom;Assist for transportation;Direct supervision/assist for medications management;Direct supervision/assist for financial management;Help with stairs or ramp for entrance    Equipment Recommendations Wheelchair (measurements PT);Wheelchair cushion (measurements PT)   Recommendations for Other Services       Functional Status Assessment Patient has had a recent decline in their functional status and demonstrates the ability to make significant improvements in function in a reasonable and predictable amount of time.     Precautions / Restrictions Precautions Precautions: Fall Precaution Comments: pt/daughter report one loss of balance ~3 weeks ago, pt slid against a wall but didn't fall to floor Restrictions Weight Bearing Restrictions: No      Mobility  Bed Mobility               General bed mobility comments: up in recliner    Transfers Overall transfer level: Needs assistance Equipment used: Rolling walker (2 wheels) Transfers: Sit to/from Stand Sit to Stand: Mod assist, +2 physical assistance           General transfer comment: very unsteady initially upon standing, +2 mod A to power up and then for standing balance with RW    Ambulation/Gait Ambulation/Gait assistance: Min assist, +2 safety/equipment Gait Distance (Feet): 35 Feet Assistive device: Rolling walker (2 wheels) Gait Pattern/deviations: Step-to pattern, Decreased step length - right, Decreased step length - left Gait velocity: decr     General Gait Details: Frequent verbal cues for positioning in RW, VCs to increase step length, HR 42 at rest, 50 with walking, pt denied dizziness  Stairs            Wheelchair Mobility    Modified Rankin (Stroke Patients Only)       Balance Overall balance assessment: Needs assistance Sitting-balance support: Feet supported, No upper extremity supported Sitting balance-Leahy Scale: Fair     Standing balance support: Bilateral upper extremity supported Standing balance-Leahy Scale: Poor Standing balance comment: very unsteady initially in standing with RW requiring mod A of 2, then after  a few steps walking min A of 1                             Pertinent Vitals/Pain Pain Assessment Pain  Assessment: No/denies pain    Home Living Family/patient expects to be discharged to:: Private residence Living Arrangements: Children Available Help at Discharge: Family;Available PRN/intermittently   Home Access: Stairs to enter   Entrance Stairs-Number of Steps: 2   Home Layout: One level Home Equipment: Rollator (4 wheels) Additional Comments: was sponge bathing, lives with daughter who works    Prior Function Prior Level of Function : Independent/Modified Independent             Mobility Comments: doesn't drive, works at a hotel 4 hrs/day in mornings ADLs Comments: independent sponge baths     Hand Dominance        Extremity/Trunk Assessment   Upper Extremity Assessment Upper Extremity Assessment: Defer to OT evaluation    Lower Extremity Assessment Lower Extremity Assessment: Generalized weakness (B knee ext 4/5, crepitus noted in R knee)    Cervical / Trunk Assessment Cervical / Trunk Assessment: Normal  Communication   Communication: No difficulties  Cognition Arousal/Alertness: Awake/alert Behavior During Therapy: WFL for tasks assessed/performed Overall Cognitive Status: Within Functional Limits for tasks assessed                                 General Comments: mild short term memory deficits        General Comments      Exercises     Assessment/Plan    PT Assessment Patient needs continued PT services  PT Problem List Decreased mobility;Decreased activity tolerance;Decreased balance;Decreased knowledge of use of DME       PT Treatment Interventions Gait training;Therapeutic exercise;Patient/family education;Therapeutic activities    PT Goals (Current goals can be found in the Care Plan section)  Acute Rehab PT Goals Patient Stated Goal: be able to walk at home PT Goal Formulation: With patient/family Time For Goal Achievement: 11/25/21 Potential to Achieve Goals: Good    Frequency Min 3X/week     Co-evaluation                AM-PAC PT "6 Clicks" Mobility  Outcome Measure Help needed turning from your back to your side while in a flat bed without using bedrails?: A Little Help needed moving from lying on your back to sitting on the side of a flat bed without using bedrails?: A Lot Help needed moving to and from a bed to a chair (including a wheelchair)?: A Lot Help needed standing up from a chair using your arms (e.g., wheelchair or bedside chair)?: A Lot Help needed to walk in hospital room?: A Lot Help needed climbing 3-5 steps with a railing? : Total 6 Click Score: 12    End of Session Equipment Utilized During Treatment: Gait belt Activity Tolerance: Patient tolerated treatment well Patient left: in chair;with chair alarm set;with call bell/phone within reach;with family/visitor present Nurse Communication: Mobility status PT Visit Diagnosis: Difficulty in walking, not elsewhere classified (R26.2)    Time: 4580-9983 PT Time Calculation (min) (ACUTE ONLY): 35 min   Charges:   PT Evaluation $PT Eval Moderate Complexity: 1 Mod PT Treatments $Gait Training: 8-22 mins       Blondell Reveal Kistler PT 11/11/2021  Acute Rehabilitation Services Pager [phone] Office [phone]

## 2021-11-11 NOTE — Telephone Encounter (Signed)
Could we add her on to my schedule on Tuesday 2/7?  OK to double book

## 2021-11-11 NOTE — Progress Notes (Signed)
PROGRESS NOTE    Kara Mullins  HQP:[id number] DOB: 1951/03/31 DOA: 11/08/2021 PCP: [name], NP    Brief Narrative:  71 y.o. female with medical history significant of chronic combined systolic and diastolic heart failure, generalized anxiety disorder, hyperlipidemia, hypertension, stage IIIb CKD who was brought to the emergency department due to inability to swallow less than an hour before arrival. While in ED, pt had episode of syncope, found to be hypothermic and evidence suggesting UTI. Pt was admitted for further work up  Assessment & Plan:   Principal Problem:   Hypothermia Active Problems:   Essential hypertension   AKI (acute kidney injury) ([place])   Hyperlipidemia   Chronic combined systolic and diastolic congestive heart failure (HCC)   Stage 3b chronic kidney disease (CKD) (HCC)   Pancytopenia (HCC)   Severe protein-calorie malnutrition (HCC)  Principal Problem:   Severe sepsis with UTI Presents with hypothermia, pancytopenia, ARF, toxic metabolic encephalopathy UA reviewed, suggestive of UTI Continue IV cefepime and flagyl Still requiring Bair hugger Procalcitonin elevated at 0.59, will repeat level Follow blood culture and sensitivity.   Urine cx pending   Active Problems:   Dysphagia/dysarthria MRI of the brain was negative.     Essential hypertension Holding diuretics. Coreg now on hold, per below Cont to monitor BP and heart rate. On PRN hydralazine     AKI (acute kidney injury) ([place]) Superimposed on   Stage 3b chronic kidney disease (CKD) ([place]) Cont IVF hydration as tolerated Renal US reviewed. Findings of chronic B hydronephrosis. Chart reviewed, pt was seen by urology in 5/22 for hydronephrosis, recs for no further w/u at that time Serial bladder scans suggesting outlet obstruction, will place indwelling Given no significant improvement in renal function, have consulted Nephrology    Pancytopenia ([place]) Suspect secondary to acute  illness Blood counts trending down this AM All blood counts are slowly improving Repeat cbc in AM     Hyperlipidemia Continue rosuvastatin 10 mg p.o. daily.     Chronic combined systolic and diastolic congestive heart failure (HCC) No signs of decompensation. Holding furosemide, losartan and spironolactone. On IVF as per above, tolerating     Severe protein-calorie malnutrition ([place]) Protein supplementation. Nutritional services evaluation.  Sinus Bradycardia -HR as low as the 30's -Pt seen, noted to be asymptomatic, hypertensive, alert -Discussed with Cardiology who recommends holding coreg -cont to monitor on tele -EKG reviewed, sinus brady with HR around 40   DVT prophylaxis: SCD's Code Status: Full Family Communication: Pt in room, pt's daughter at bedside  Status is: Inpatient  Remains inpatient appropriate because: Severity of illness    Consultants:    Procedures:    Antimicrobials: Anti-infectives (From admission, onward)    Start     Dose/Rate Route Frequency Ordered Stop   11/09/21 2200  ceFEPIme (MAXIPIME) 2 g in sodium chloride 0.9 % 100 mL IVPB        2 g 200 mL/hr over 30 Minutes Intravenous Every 24 hours 11/09/21 0323     11/09/21 0322  vancomycin variable dose per unstable renal function (pharmacist dosing)  Status:  Discontinued         Does not apply See admin instructions 11/09/21 0323 11/09/21 1355   11/09/21 0215  ceFEPIme (MAXIPIME) 2 g in sodium chloride 0.9 % 100 mL IVPB        2 g 200 mL/hr over 30 Minutes Intravenous  Once 11/09/21 0210 11/09/21 0302   11/09/21 0215  metroNIDAZOLE (FLAGYL) IVPB 500 mg  500 mg 100 mL/hr over 60 Minutes Intravenous Every 12 hours 11/09/21 0210 11/16/21 0214   11/09/21 0215  vancomycin (VANCOCIN) IVPB 1000 mg/200 mL premix        1,000 mg 200 mL/hr over 60 Minutes Intravenous  Once 11/09/21 0210 11/09/21 0339       Subjective: Without complaints this AM  Objective: Vitals:   11/11/21 0900  11/11/21 1000 11/11/21 1200 11/11/21 1300  BP: (!) 155/70  (!) 169/87 (!) 182/80  Pulse: (!) 48 (!) 46 (!) 39 (!) 41  Resp: 14 12 (!) 9 12  Temp:      TempSrc:      SpO2: 100% 100% 100% 100%  Weight:      Height:        Intake/Output Summary (Last 24 hours) at 11/11/2021 1339 Last data filed at 11/11/2021 1300 Gross per 24 hour  Intake 1297.83 ml  Output 375 ml  Net 922.83 ml    Filed Weights   11/09/21 0330  Weight: 79.8 kg    Examination: General exam: Conversant, in no acute distress Respiratory system: normal chest rise, clear, no audible wheezing Cardiovascular system: regular rhythm, s1-s2 Gastrointestinal system: Nondistended, nontender, pos BS Central nervous system: No seizures, no tremors Extremities: No cyanosis, no joint deformities Skin: No rashes, no pallor Psychiatry: Affect normal // no auditory hallucinations   Data Reviewed: I have personally reviewed following labs and imaging studies  CBC: Recent Labs  Lab 11/06/21 1617 11/08/21 2357 11/10/21 0240 11/11/21 0243  WBC 2.8* 2.8* 3.1* 3.8*  NEUTROABS 2,041 2.2  --   --   HGB 9.6* 9.1* 7.7* 8.4*  HCT 29.3* 28.1* 24.8* 25.8*  MCV 81.6 83.6 87.3 83.8  PLT CANCELED 56* 46* 53*    Basic Metabolic Panel: Recent Labs  Lab 11/06/21 1617 11/08/21 2357 11/10/21 0240 11/11/21 0243  NA 143 142 137 139  K 3.7 4.5 3.5 3.3*  CL 113* 115* 113* 113*  CO2 24 19* 18* 18*  GLUCOSE 148* 118* 55* 80  BUN 64* 68* 68* 70*  CREATININE 4.06* 4.10* 4.23* 4.33*  CALCIUM 7.2* 7.0* 6.6* 6.7*    GFR: Estimated Creatinine Clearance: 12.8 mL/min (A) (by C-G formula based on SCr of 4.33 mg/dL (H)). Liver Function Tests: Recent Labs  Lab 11/08/21 2357 11/10/21 0240 11/11/21 0243  AST 52* 24 25  ALT 22 18 17   ALKPHOS 154* 115 124  BILITOT 1.0 0.7 0.7  PROT 6.1* 5.3* 5.4*  ALBUMIN 2.2* 2.5* 2.2*    Recent Labs  Lab 11/08/21 2357  LIPASE 65*    No results for input(s): AMMONIA in the last 168  hours. Coagulation Profile: No results for input(s): INR, PROTIME in the last 168 hours. Cardiac Enzymes: No results for input(s): CKTOTAL, CKMB, CKMBINDEX, TROPONINI in the last 168 hours. BNP (last 3 results) No results for input(s): PROBNP in the last 8760 hours. HbA1C: No results for input(s): HGBA1C in the last 72 hours. CBG: Recent Labs  Lab 11/08/21 2358 11/10/21 0902  GLUCAP 113* 66*    Lipid Profile: No results for input(s): CHOL, HDL, LDLCALC, TRIG, CHOLHDL, LDLDIRECT in the last 72 hours. Thyroid Function Tests: Recent Labs    11/09/21 0022  TSH 3.941    Anemia Panel: No results for input(s): VITAMINB12, FOLATE, FERRITIN, TIBC, IRON, RETICCTPCT in the last 72 hours. Sepsis Labs: Recent Labs  Lab 11/09/21 0022 11/09/21 0159 11/09/21 0210 11/10/21 0240  PROCALCITON  --   --  <0.10 0.59  LATICACIDVEN 0.5 0.5  --   --  Recent Results (from the past 240 hour(s))  Resp Panel by RT-PCR (Flu A&B, Covid) Nasopharyngeal Swab     Status: None   Collection Time: 11/09/21 12:13 AM   Specimen: Nasopharyngeal Swab; Nasopharyngeal(NP) swabs in vial transport medium  Result Value Ref Range Status   SARS Coronavirus 2 by RT PCR NEGATIVE NEGATIVE Final    Comment: (NOTE) SARS-CoV-2 target nucleic acids are NOT DETECTED.  The SARS-CoV-2 RNA is generally detectable in upper respiratory specimens during the acute phase of infection. The lowest concentration of SARS-CoV-2 viral copies this assay can detect is 138 copies/mL. A negative result does not preclude SARS-Cov-2 infection and should not be used as the sole basis for treatment or other patient management decisions. A negative result may occur with  improper specimen collection/handling, submission of specimen other than nasopharyngeal swab, presence of viral mutation(s) within the areas targeted by this assay, and inadequate number of viral copies(<138 copies/mL). A negative result must be combined  with clinical observations, patient history, and epidemiological information. The expected result is Negative.  Fact Sheet for Patients:  EntrepreneurPulse.com.au  Fact Sheet for Healthcare Providers:  IncredibleEmployment.be  This test is no t yet approved or cleared by the [place] FDA and  has been authorized for detection and/or diagnosis of SARS-CoV-2 by FDA under an Emergency Use Authorization (EUA). This EUA will remain  in effect (meaning this test can be used) for the duration of the COVID-19 declaration under Section 564(b)(1) of the Act, 21 U.S.C.section 360bbb-3(b)(1), unless the authorization is terminated  or revoked sooner.       Influenza A by PCR NEGATIVE NEGATIVE Final   Influenza B by PCR NEGATIVE NEGATIVE Final    Comment: (NOTE) The Xpert Xpress SARS-CoV-2/FLU/RSV plus assay is intended as an aid in the diagnosis of influenza from Nasopharyngeal swab specimens and should not be used as a sole basis for treatment. Nasal washings and aspirates are unacceptable for Xpert Xpress SARS-CoV-2/FLU/RSV testing.  Fact Sheet for Patients: EntrepreneurPulse.com.au  Fact Sheet for Healthcare Providers: IncredibleEmployment.be  This test is not yet approved or cleared by the [place] FDA and has been authorized for detection and/or diagnosis of SARS-CoV-2 by FDA under an Emergency Use Authorization (EUA). This EUA will remain in effect (meaning this test can be used) for the duration of the COVID-19 declaration under Section 564(b)(1) of the Act, 21 U.S.C. section 360bbb-3(b)(1), unless the authorization is terminated or revoked.  Performed at [facility], [place] [address] [place], [place] [address]   MRSA Next Gen by PCR, Nasal     Status: None   Collection Time: 11/09/21  2:29 AM   Specimen: Nasal Mucosa; Nasal Swab  Result Value Ref Range Status   MRSA by PCR  Next Gen NOT DETECTED NOT DETECTED Final    Comment: (NOTE) The GeneXpert MRSA Assay (FDA approved for NASAL specimens only), is one component of a comprehensive MRSA colonization surveillance program. It is not intended to diagnose MRSA infection nor to guide or monitor treatment for MRSA infections. Test performance is not FDA approved in patients less than 15 years old. Performed at [facility], [place] [address], [place], [place] [address]   Urine Culture     Status: Abnormal   Collection Time: 11/09/21  9:44 AM   Specimen: Urine, Clean Catch  Result Value Ref Range Status   Specimen Description   Final    URINE, CLEAN CATCH Performed at [facility], [place] [address]  [place], [place] [address]    Special Requests   Final    NONE Performed at [facility], [place] [address], [place], [place] [address]    Culture (A)  Final    <10,000 COLONIES/mL INSIGNIFICANT GROWTH Performed at [place] [address], [place], [place] [address]    Report Status 11/10/2021 FINAL  Final  C Difficile Quick Screen w PCR reflex     Status: None   Collection Time: 11/10/21 11:08 AM   Specimen: STOOL  Result Value Ref Range Status   C Diff antigen NEGATIVE NEGATIVE Final   C Diff toxin NEGATIVE NEGATIVE Final   C Diff interpretation No C. difficile detected.  Final    Comment: Performed at [facility], [place] [address], [place], [place] [address]      Radiology Studies: MR BRAIN WO CONTRAST  Result Date: 11/09/2021 CLINICAL DATA:  Mental status change, unknown cause; dysphagia and motor aphasia EXAM: MRI HEAD WITHOUT CONTRAST TECHNIQUE: Multiplanar, multiecho pulse sequences of the brain and surrounding structures were obtained without intravenous contrast. COMPARISON:  None. FINDINGS: Brain: There is no acute infarction or intracranial hemorrhage. There is no intracranial mass, mass effect, or edema. There is no  hydrocephalus or extra-axial fluid collection. Ventricles and sulci are normal in size and configuration. Minimal small foci of T2 hyperintensity in the supratentorial white matter may reflect minor chronic microvascular ischemic changes. Vascular: Major vessel flow voids at the skull base are preserved. Skull and upper cervical spine: Normal marrow signal is preserved. Sinuses/Orbits: Paranasal sinuses are aerated. Bilateral lens replacements. Other: Sella is unremarkable.  Mastoid air cells are clear. IMPRESSION: No acute infarction, hemorrhage, or mass. Electronically Signed   By: Macy Mis M.D.   On: 11/09/2021 14:43   US RENAL  Result Date: 11/10/2021 CLINICAL DATA:  Acute renal failure. EXAM: RENAL / URINARY TRACT ULTRASOUND COMPLETE COMPARISON:  CT renal 03/08/2021, chest x-ray 11/09/2021 FINDINGS: Right Kidney: Renal measurements: 12 x 4.7 x 4.3 cm = volume: 126 mL. Echogenicity within normal limits. No mass. Persistent mild hydronephrosis visualized. Left Kidney: Renal measurements: 12.2 x 6.6 x 4.8 cm = volume: 190 mL. Echogenicity within normal limits. No mass. Persistent moderate hydronephrosis visualized. Query parapelvic cysts versus more likely dilated renal pelvis. Urinary bladder: Layering debris within the urinary bladder lumen. Otherwise appears normal for degree of bladder distention. Other: At least moderate right and small left pleural effusions. There is a 4.6 x 4.2 x 5 cm cystic lesion within the pelvis posterior to the urinary bladder. IMPRESSION: 1. Persistent mild right hydronephrosis. 2. Persistent moderate left hydronephrosis. 3. A 4.6 x 4.2 x 5 cm cystic lesion within the pelvis posterior to the urinary bladder which may correlate to a prior identified left adnexal cyst - please see pelvic ultrasound 03/08/2021 for further details and consider follow-up pelvic ultrasound as per recommendations on this prior study. 4. At least moderate right and small left pleural effusions.  Electronically Signed   By: Iven Finn M.D.   On: 11/10/2021 17:04   ECHOCARDIOGRAM COMPLETE  Result Date: 11/10/2021    ECHOCARDIOGRAM REPORT   Patient Name:   BRAYLON LEMMONS Date of Exam: 11/10/2021 Medical Rec #:  [id number]      Height:       65.0 in Accession #:    [id number]     Weight:       175.9 lb Date of Birth:  Jun 03, 1951     BSA:  1.873 m Patient Age:    16 years       BP:           179/86 mmHg Patient Gender: F              HR:           50 bpm. Exam Location:  Inpatient Procedure: 2D Echo, 3D Echo, Cardiac Doppler, Color Doppler and Strain Analysis Indications:    Syncope R55  History:        Patient has prior history of Echocardiogram examinations, most                 recent 03/05/2021. CHF; Risk Factors:Hypertension and                 Dyslipidemia. Chronic kidney disease.  Sonographer:    Darlina Sicilian RDCS Referring Phys: [id number] DAVID MANUEL [place]  1. Left ventricular ejection fraction, by estimation, is 55 to 60%. The left ventricle has normal function. The left ventricle has no regional wall motion abnormalities. There is moderate left ventricular hypertrophy. Left ventricular diastolic parameters are consistent with Grade II diastolic dysfunction (pseudonormalization). Elevated left atrial pressure.  2. Right ventricular systolic function is normal. The right ventricular size is normal. There is normal pulmonary artery systolic pressure.  3. Left atrial size was mildly dilated.  4. Moderate pleural effusion in the left lateral region.  5. The mitral valve is abnormal. Mild mitral valve regurgitation. No evidence of mitral stenosis.  6. The tricuspid valve is abnormal.  7. The aortic valve is tricuspid. There is mild calcification of the aortic valve. There is mild thickening of the aortic valve. Aortic valve regurgitation is mild. No aortic stenosis is present.  8. The inferior vena cava is normal in size with greater than 50% respiratory variability, suggesting  right atrial pressure of 3 mmHg. FINDINGS  Left Ventricle: Left ventricular ejection fraction, by estimation, is 55 to 60%. The left ventricle has normal function. The left ventricle has no regional wall motion abnormalities. Global longitudinal strain performed but not reported based on interpreter judgement due to suboptimal tracking. The left ventricular internal cavity size was normal in size. There is moderate left ventricular hypertrophy. Left ventricular diastolic parameters are consistent with Grade II diastolic dysfunction (pseudonormalization). Elevated left atrial pressure. Right Ventricle: The right ventricular size is normal. No increase in right ventricular wall thickness. Right ventricular systolic function is normal. There is normal pulmonary artery systolic pressure. The tricuspid regurgitant velocity is 2.42 m/s, and  with an assumed right atrial pressure of 3 mmHg, the estimated right ventricular systolic pressure is 14.4 mmHg. Left Atrium: Left atrial size was mildly dilated. Right Atrium: Right atrial size was normal in size. Pericardium: There is no evidence of pericardial effusion. Mitral Valve: The mitral valve is abnormal. There is mild thickening of the mitral valve leaflet(s). There is mild calcification of the mitral valve leaflet(s). Mild mitral annular calcification. Mild mitral valve regurgitation. No evidence of mitral valve stenosis. Tricuspid Valve: The tricuspid valve is abnormal. Tricuspid valve regurgitation is mild . No evidence of tricuspid stenosis. Aortic Valve: The aortic valve is tricuspid. There is mild calcification of the aortic valve. There is mild thickening of the aortic valve. There is mild aortic valve annular calcification. Aortic valve regurgitation is mild. Aortic regurgitation PHT measures 945 msec. No aortic stenosis is present. Aortic valve mean gradient measures 5.1 mmHg. Aortic valve peak gradient measures 12.1 mmHg. Aortic valve area, by VTI measures  1.73  cm. Pulmonic Valve: The pulmonic valve was not well visualized. Pulmonic valve regurgitation is trivial. No evidence of pulmonic stenosis. Aorta: The aortic root is normal in size and structure. Venous: The inferior vena cava is normal in size with greater than 50% respiratory variability, suggesting right atrial pressure of 3 mmHg. IAS/Shunts: No atrial level shunt detected by color flow Doppler. Additional Comments: There is a moderate pleural effusion in the left lateral region. Moderate ascites is present.  LEFT VENTRICLE PLAX 2D LVIDd:         5.00 cm      Diastology LVIDs:         3.30 cm      LV e' medial:    2.93 cm/s LV PW:         1.40 cm      LV E/e' medial:  26.8 LV IVS:        1.40 cm      LV e' lateral:   3.38 cm/s LVOT diam:     1.90 cm      LV E/e' lateral: 23.2 LV SV:         78 LV SV Index:   41 LVOT Area:     2.84 cm                              3D Volume EF: LV Volumes (MOD)            3D EF:        54 % LV vol d, MOD A2C: 157.0 ml LV EDV:       178 ml LV vol d, MOD A4C: 144.0 ml LV ESV:       82 ml LV vol s, MOD A2C: 69.4 ml  LV SV:        96 ml LV vol s, MOD A4C: 71.2 ml LV SV MOD A2C:     87.6 ml LV SV MOD A4C:     144.0 ml LV SV MOD BP:      82.2 ml RIGHT VENTRICLE RV S prime:     10.60 cm/s TAPSE (M-mode): 2.2 cm LEFT ATRIUM             Index        RIGHT ATRIUM           Index LA diam:        3.40 cm 1.82 cm/m   RA Area:     14.40 cm LA Vol (A2C):   74.9 ml 39.99 ml/m  RA Volume:   33.00 ml  17.62 ml/m LA Vol (A4C):   76.7 ml 40.95 ml/m LA Biplane Vol: 76.5 ml 40.84 ml/m  AORTIC VALVE AV Area (Vmax):    1.78 cm AV Area (Vmean):   1.88 cm AV Area (VTI):     1.73 cm AV Vmax:           173.74 cm/s AV Vmean:          101.014 cm/s AV VTI:            0.448 m AV Peak Grad:      12.1 mmHg AV Mean Grad:      5.1 mmHg LVOT Vmax:         109.00 cm/s LVOT Vmean:        66.900 cm/s LVOT VTI:          0.274 m LVOT/AV VTI ratio: 0.61 AI  PHT:            945 msec  AORTA Ao Root diam: 3.60 cm Ao  Asc diam:  3.00 cm MITRAL VALVE               TRICUSPID VALVE MV Area (PHT):  4.60 cm   TR Peak grad:   23.4 mmHg MV Area (plan): 6.97 cm   TR Vmax:        242.00 cm/s MV Decel Time:  165 msec MV E velocity: 78.40 cm/s  SHUNTS MV A velocity: 66.40 cm/s  Systemic VTI:  0.27 m MV E/A ratio:  1.18        Systemic Diam: 1.90 cm Carlyle Dolly MD Electronically signed by Carlyle Dolly MD Signature Date/Time: 11/10/2021/1:30:51 PM    Final     Scheduled Meds:  Chlorhexidine Gluconate Cloth  6 each Topical Daily   mouth rinse  15 mL Mouth Rinse BID   rosuvastatin  10 mg Oral Daily   Continuous Infusions:  ceFEPime (MAXIPIME) IV Stopped (11/10/21 2259)   lactated ringers 75 mL/hr at 11/11/21 0337   metronidazole Stopped (11/11/21 0321)     LOS: 2 days   Marylu Lund, MD [facility] Pager On Amion  If 7PM-7AM, please contact night-coverage 11/11/2021, 1:39 PM

## 2021-11-12 ENCOUNTER — Encounter (HOSPITAL_COMMUNITY): Payer: Self-pay | Admitting: *Deleted

## 2021-11-12 DIAGNOSIS — N179 Acute kidney failure, unspecified: Secondary | ICD-10-CM | POA: Diagnosis not present

## 2021-11-12 LAB — COMPREHENSIVE METABOLIC PANEL
ALT: 16 U/L (ref 0–44)
AST: 25 U/L (ref 15–41)
Albumin: 2 g/dL — ABNORMAL LOW (ref 3.5–5.0)
Alkaline Phosphatase: 110 U/L (ref 38–126)
Anion gap: 8 (ref 5–15)
BUN: 69 mg/dL — ABNORMAL HIGH (ref 8–23)
CO2: 17 mmol/L — ABNORMAL LOW (ref 22–32)
Calcium: 7 mg/dL — ABNORMAL LOW (ref 8.9–10.3)
Chloride: 114 mmol/L — ABNORMAL HIGH (ref 98–111)
Creatinine, Ser: 4.29 mg/dL — ABNORMAL HIGH (ref 0.44–1.00)
GFR, Estimated: 11 mL/min — ABNORMAL LOW (ref >60)
Glucose, Bld: 57 mg/dL — ABNORMAL LOW (ref 70–99)
Potassium: 3.8 mmol/L (ref 3.5–5.1)
Sodium: 139 mmol/L (ref 135–145)
Total Bilirubin: 0.7 mg/dL (ref 0.3–1.2)
Total Protein: 5.1 g/dL — ABNORMAL LOW (ref 6.5–8.1)

## 2021-11-12 LAB — CBC
HCT: 26.3 % — ABNORMAL LOW (ref 36.0–46.0)
Hemoglobin: 8.2 g/dL — ABNORMAL LOW (ref 12.0–15.0)
MCH: 27 pg (ref 26.0–34.0)
MCHC: 31.2 g/dL (ref 30.0–36.0)
MCV: 86.5 fL (ref 80.0–100.0)
Platelets: 61 10*3/uL — ABNORMAL LOW (ref 150–400)
RBC: 3.04 MIL/uL — ABNORMAL LOW (ref 3.87–5.11)
RDW: 18.1 % — ABNORMAL HIGH (ref 11.5–15.5)
WBC: 3.7 10*3/uL — ABNORMAL LOW (ref 4.0–10.5)
nRBC: 1.4 % — ABNORMAL HIGH (ref 0.0–0.2)

## 2021-11-12 LAB — GLUCOSE, CAPILLARY
Glucose-Capillary: 56 mg/dL — ABNORMAL LOW (ref 70–99)
Glucose-Capillary: 79 mg/dL (ref 70–99)
Glucose-Capillary: 86 mg/dL (ref 70–99)
Glucose-Capillary: 91 mg/dL (ref 70–99)
Glucose-Capillary: 95 mg/dL (ref 70–99)
Glucose-Capillary: 97 mg/dL (ref 70–99)

## 2021-11-12 LAB — PROCALCITONIN: Procalcitonin: 0.27 ng/mL

## 2021-11-12 MED ORDER — LORAZEPAM 2 MG/ML IJ SOLN: 2.0000 mg | Freq: Once | INTRAMUSCULAR | Status: DC

## 2021-11-12 MED ORDER — LORAZEPAM 2 MG/ML IJ SOLN: 0.5000 mg | Freq: Once | INTRAMUSCULAR | Status: AC

## 2021-11-12 MED ORDER — LORAZEPAM 2 MG/ML IJ SOLN
INTRAMUSCULAR | Status: AC
  Administered 2021-11-12: 0.5 mg via INTRAVENOUS
  Filled 2021-11-12: qty 1

## 2021-11-12 MED ORDER — DEXTROSE-NACL 5-0.9 % IV SOLN: INTRAVENOUS | Status: DC

## 2021-11-12 MED ORDER — DEXTROSE 50 % IV SOLN
INTRAVENOUS | Status: AC
  Filled 2021-11-12: qty 50

## 2021-11-12 MED ORDER — DEXTROSE 50 % IV SOLN
12.5000 g | Freq: Once | INTRAVENOUS | Status: AC
  Administered 2021-11-12: 12.5 g via INTRAVENOUS

## 2021-11-12 NOTE — Progress Notes (Signed)
Patient ID: Kara Mullins, female   DOB: 1951/06/01, 71 y.o.   MRN: [id number] [facility] Progress Note   Assessment/ Plan:   1. Acute kidney Injury on chronic kidney disease stage IIIb: Baseline creatinine ranging 1.3-1.6 in mid 2022 with current acute kidney injury that is suspected to be likely from ATN but cannot entirely rule out kidney injury associated with plasma cell dyscrasia.  She is nonoliguric at this time and does not have any acute indications for dialysis. 2.  Non-Anion gap metabolic acidosis: Secondary to acute kidney injury superimposed on chronic kidney disease and likely plasma cell dyscrasia.  Start oral sodium bicarbonate and trend labs. 3.  Pancytopenia: Likely associated with sepsis but cannot entirely rule out the impact of plasma cell dyscrasia on this process.  Remains on broad-spectrum intravenous antibiotics. 4.  Hypertension: Blood pressures intermittently elevated, continue to avoid RAAS blockade in the setting of acute kidney injury. 5.  History of combined systolic/diastolic congestive heart failure: With some seemingly chronic pedal edema but without any respiratory findings to prompt need for diuresis.  Subjective:   Without acute events overnight, family at bedside have questions about any progress or lab changes.   Objective:   BP (!) 147/66    Pulse 62    Temp 98.2 F (36.8 C)    Resp (!) 8    Ht 5\' 5"  (1.651 m)    Wt 79.8 kg    SpO2 100%    BMI 29.28 kg/m   Intake/Output Summary (Last 24 hours) at 11/12/2021 1347 Last data filed at 11/12/2021 1200 Gross per 24 hour  Intake 1901.17 ml  Output 900 ml  Net 1001.17 ml   Weight change:   Physical Exam: Gen: Somnolent resting comfortably in bed, family members at bedside CVS: Pulse regular rhythm, normal rate, S1 and S2 normal Resp: Poor inspiratory effort with decreased breath sounds over bases, no rales/rhonchi Abd: Soft, obese, nontender, bowel sounds normal Ext: Trace bilateral  pitting pretibial edema, 1-2+ hip edema.  Imaging: US RENAL  Result Date: 11/10/2021 CLINICAL DATA:  Acute renal failure. EXAM: RENAL / URINARY TRACT ULTRASOUND COMPLETE COMPARISON:  CT renal 03/08/2021, chest x-ray 11/09/2021 FINDINGS: Right Kidney: Renal measurements: 12 x 4.7 x 4.3 cm = volume: 126 mL. Echogenicity within normal limits. No mass. Persistent mild hydronephrosis visualized. Left Kidney: Renal measurements: 12.2 x 6.6 x 4.8 cm = volume: 190 mL. Echogenicity within normal limits. No mass. Persistent moderate hydronephrosis visualized. Query parapelvic cysts versus more likely dilated renal pelvis. Urinary bladder: Layering debris within the urinary bladder lumen. Otherwise appears normal for degree of bladder distention. Other: At least moderate right and small left pleural effusions. There is a 4.6 x 4.2 x 5 cm cystic lesion within the pelvis posterior to the urinary bladder. IMPRESSION: 1. Persistent mild right hydronephrosis. 2. Persistent moderate left hydronephrosis. 3. A 4.6 x 4.2 x 5 cm cystic lesion within the pelvis posterior to the urinary bladder which may correlate to a prior identified left adnexal cyst - please see pelvic ultrasound 03/08/2021 for further details and consider follow-up pelvic ultrasound as per recommendations on this prior study. 4. At least moderate right and small left pleural effusions. Electronically Signed   By: Iven Finn M.D.   On: 11/10/2021 17:04    Labs: BMET Recent Labs  Lab 11/06/21 1617 11/08/21 2357 11/10/21 0240 11/11/21 0243 11/12/21 0254  NA 143 142 137 139 139  K 3.7 4.5 3.5 3.3* 3.8  CL 113* 115* 113* 113*  114*  CO2 24 19* 18* 18* 17*  GLUCOSE 148* 118* 55* 80 57*  BUN 64* 68* 68* 70* 69*  CREATININE 4.06* 4.10* 4.23* 4.33* 4.29*  CALCIUM 7.2* 7.0* 6.6* 6.7* 7.0*   CBC Recent Labs  Lab 11/06/21 1617 11/08/21 2357 11/10/21 0240 11/11/21 0243 11/12/21 0254  WBC 2.8* 2.8* 3.1* 3.8* 3.7*  NEUTROABS 2,041 2.2  --   --    --   HGB 9.6* 9.1* 7.7* 8.4* 8.2*  HCT 29.3* 28.1* 24.8* 25.8* 26.3*  MCV 81.6 83.6 87.3 83.8 86.5  PLT CANCELED 56* 46* 53* 61*    Medications:     Chlorhexidine Gluconate Cloth  6 each Topical Daily   LORazepam  2 mg Intravenous Once   mouth rinse  15 mL Mouth Rinse BID   rosuvastatin  10 mg Oral Daily   Elmarie Shiley, MD 11/12/2021, 1:47 PM

## 2021-11-12 NOTE — Telephone Encounter (Signed)
Called and spoke with patients daughter- okay per DPR. Patient is currently admitted- scheduled patient a follow up hospital appointment for 2/7 at 1:20. Advised patients daughter to call back to reschedule if needed. Patients daughter verbalized understanding.

## 2021-11-12 NOTE — Progress Notes (Signed)
PROGRESS NOTE    Kara Mullins  ZOX:[id number] DOB: Jan 09, 1951 DOA: 11/08/2021 PCP: [name], NP    Brief Narrative:  71 y.o. female with medical history significant of chronic combined systolic and diastolic heart failure, generalized anxiety disorder, hyperlipidemia, hypertension, stage IIIb CKD who was brought to the emergency department due to inability to swallow less than an hour before arrival. While in ED, pt had episode of syncope, found to be hypothermic and evidence suggesting UTI. Pt was admitted for further work up  Assessment & Plan:   Principal Problem:   Hypothermia Active Problems:   Essential hypertension   AKI (acute kidney injury) (Start)   Hyperlipidemia   Chronic combined systolic and diastolic congestive heart failure (HCC)   Stage 3b chronic kidney disease (CKD) (HCC)   Pancytopenia (HCC)   Severe protein-calorie malnutrition (HCC)  Principal Problem:   Severe sepsis with UTI present on admit Presents with hypothermia, pancytopenia, ARF, toxic metabolic encephalopathy UA reviewed, suggestive of UTI Continue IV cefepime and flagyl, narrow to cefepime monotherapy Initially required [facility], now no longer hypothermic Procalcitonin trending down Follow blood culture and sensitivity.   Urine cx pos for enterobacter species, pending sensitivities   Active Problems:   Dysphagia/dysarthria MRI of the brain was negative.     Essential hypertension Holding diuretics given ARF Coreg now on hold, per below Cont to monitor BP and heart rate. On PRN hydralazine     AKI (acute kidney injury) ([place]) Superimposed on   Stage 3b chronic kidney disease (CKD) ([place]) Cont IVF hydration as tolerated Renal US reviewed. Findings of chronic B hydronephrosis. Chart reviewed, pt was seen by urology in 5/22 for hydronephrosis, recs for no further w/u at that time Serial bladder scans suggesting outlet obstruction, now with indwelling cath Nephrology following,  appreciate recs Cr overall stable today Repeat bmet in AM    Pancytopenia ([place]) Suspect secondary to acute illness Blood counts trending down this AM Blood lines improving Repeat cbc in AM     Hyperlipidemia Continue rosuvastatin 10 mg p.o. daily.     Chronic combined systolic and diastolic congestive heart failure (HCC) No signs of decompensation. Holding furosemide, losartan and spironolactone given ARF On IVF as per above, tolerating     Severe protein-calorie malnutrition (HCC) Protein supplementation. Nutritional services evaluation.  Sinus Bradycardia -HR recently noted to be as low as the 30's, asymptomatic -Discussed with Cardiology who recommends holding coreg -HR now improving off coreg   DVT prophylaxis: SCD's Code Status: Full Family Communication: Pt in room, pt's daughter at bedside  Status is: Inpatient  Remains inpatient appropriate because: Severity of illness    Consultants:    Procedures:    Antimicrobials: Anti-infectives (From admission, onward)    Start     Dose/Rate Route Frequency Ordered Stop   11/09/21 2200  ceFEPIme (MAXIPIME) 2 g in sodium chloride 0.9 % 100 mL IVPB        2 g 200 mL/hr over 30 Minutes Intravenous Every 24 hours 11/09/21 0323     11/09/21 0322  vancomycin variable dose per unstable renal function (pharmacist dosing)  Status:  Discontinued         Does not apply See admin instructions 11/09/21 0323 11/09/21 1355   11/09/21 0215  ceFEPIme (MAXIPIME) 2 g in sodium chloride 0.9 % 100 mL IVPB        2 g 200 mL/hr over 30 Minutes Intravenous  Once 11/09/21 0210 11/09/21 0302   11/09/21 0215  metroNIDAZOLE (FLAGYL)  IVPB 500 mg  Status:  Discontinued        500 mg 100 mL/hr over 60 Minutes Intravenous Every 12 hours 11/09/21 0210 11/12/21 1128   11/09/21 0215  vancomycin (VANCOCIN) IVPB 1000 mg/200 mL premix        1,000 mg 200 mL/hr over 60 Minutes Intravenous  Once 11/09/21 0210 11/09/21 0339        Subjective: Sleeping this AM, required anxiolytic overnight  Objective: Vitals:   11/12/21 0800 11/12/21 0900 11/12/21 1000 11/12/21 1100  BP: (!) 170/77 (!) 169/68 (!) 172/79 (!) 147/66  Pulse: 69 64 65 62  Resp: 15 18 14  (!) 8  Temp: 99.1 F (37.3 C) 98.8 F (37.1 C) 98.6 F (37 C) 98.2 F (36.8 C)  TempSrc:  Bladder    SpO2: 100% 100% 100% 100%  Weight:      Height:        Intake/Output Summary (Last 24 hours) at 11/12/2021 1337 Last data filed at 11/12/2021 1200 Gross per 24 hour  Intake 1901.17 ml  Output 900 ml  Net 1001.17 ml    Filed Weights   11/09/21 0330  Weight: 79.8 kg    Examination: General exam: asleep, in no acute distress Respiratory system: normal chest rise, clear, no audible wheezing Cardiovascular system: regular rhythm, s1-s2 Gastrointestinal system: Nondistended, nontender, pos BS Central nervous system: No seizures, no tremors Extremities: No cyanosis, no joint deformities Skin: No rashes, no pallor Psychiatry: Unable to assess given mentation  Data Reviewed: I have personally reviewed following labs and imaging studies  CBC: Recent Labs  Lab 11/06/21 1617 11/08/21 2357 11/10/21 0240 11/11/21 0243 11/12/21 0254  WBC 2.8* 2.8* 3.1* 3.8* 3.7*  NEUTROABS 2,041 2.2  --   --   --   HGB 9.6* 9.1* 7.7* 8.4* 8.2*  HCT 29.3* 28.1* 24.8* 25.8* 26.3*  MCV 81.6 83.6 87.3 83.8 86.5  PLT CANCELED 56* 46* 53* 61*    Basic Metabolic Panel: Recent Labs  Lab 11/06/21 1617 11/08/21 2357 11/10/21 0240 11/11/21 0243 11/12/21 0254  NA 143 142 137 139 139  K 3.7 4.5 3.5 3.3* 3.8  CL 113* 115* 113* 113* 114*  CO2 24 19* 18* 18* 17*  GLUCOSE 148* 118* 55* 80 57*  BUN 64* 68* 68* 70* 69*  CREATININE 4.06* 4.10* 4.23* 4.33* 4.29*  CALCIUM 7.2* 7.0* 6.6* 6.7* 7.0*    GFR: Estimated Creatinine Clearance: 12.9 mL/min (A) (by C-G formula based on SCr of 4.29 mg/dL (H)). Liver Function Tests: Recent Labs  Lab 11/08/21 2357  11/10/21 0240 11/11/21 0243 11/12/21 0254  AST 52* 24 25 25   ALT 22 18 17 16   ALKPHOS 154* 115 124 110  BILITOT 1.0 0.7 0.7 0.7  PROT 6.1* 5.3* 5.4* 5.1*  ALBUMIN 2.2* 2.5* 2.2* 2.0*    Recent Labs  Lab 11/08/21 2357  LIPASE 65*    No results for input(s): AMMONIA in the last 168 hours. Coagulation Profile: No results for input(s): INR, PROTIME in the last 168 hours. Cardiac Enzymes: No results for input(s): CKTOTAL, CKMB, CKMBINDEX, TROPONINI in the last 168 hours. BNP (last 3 results) No results for input(s): PROBNP in the last 8760 hours. HbA1C: No results for input(s): HGBA1C in the last 72 hours. CBG: Recent Labs  Lab 11/08/21 2358 11/10/21 0902 11/12/21 0723 11/12/21 0758 11/12/21 1147  GLUCAP 113* 66* 56* 79 86    Lipid Profile: No results for input(s): CHOL, HDL, LDLCALC, TRIG, CHOLHDL, LDLDIRECT in the last 72 hours. Thyroid  Function Tests: No results for input(s): TSH, T4TOTAL, FREET4, T3FREE, THYROIDAB in the last 72 hours.  Anemia Panel: No results for input(s): VITAMINB12, FOLATE, FERRITIN, TIBC, IRON, RETICCTPCT in the last 72 hours. Sepsis Labs: Recent Labs  Lab 11/09/21 0022 11/09/21 0159 11/09/21 0210 11/10/21 0240 11/11/21 1414 11/12/21 0254  PROCALCITON  --   --  <0.10 0.59 0.29 0.27  LATICACIDVEN 0.5 0.5  --   --   --   --      Recent Results (from the past 240 hour(s))  Resp Panel by RT-PCR (Flu A&B, Covid) Nasopharyngeal Swab     Status: None   Collection Time: 11/09/21 12:13 AM   Specimen: Nasopharyngeal Swab; Nasopharyngeal(NP) swabs in vial transport medium  Result Value Ref Range Status   SARS Coronavirus 2 by RT PCR NEGATIVE NEGATIVE Final    Comment: (NOTE) SARS-CoV-2 target nucleic acids are NOT DETECTED.  The SARS-CoV-2 RNA is generally detectable in upper respiratory specimens during the acute phase of infection. The lowest concentration of SARS-CoV-2 viral copies this assay can detect is 138 copies/mL. A negative  result does not preclude SARS-Cov-2 infection and should not be used as the sole basis for treatment or other patient management decisions. A negative result may occur with  improper specimen collection/handling, submission of specimen other than nasopharyngeal swab, presence of viral mutation(s) within the areas targeted by this assay, and inadequate number of viral copies(<138 copies/mL). A negative result must be combined with clinical observations, patient history, and epidemiological information. The expected result is Negative.  Fact Sheet for Patients:  EntrepreneurPulse.com.au  Fact Sheet for Healthcare Providers:  IncredibleEmployment.be  This test is no t yet approved or cleared by the [place] FDA and  has been authorized for detection and/or diagnosis of SARS-CoV-2 by FDA under an Emergency Use Authorization (EUA). This EUA will remain  in effect (meaning this test can be used) for the duration of the COVID-19 declaration under Section 564(b)(1) of the Act, 21 U.S.C.section 360bbb-3(b)(1), unless the authorization is terminated  or revoked sooner.       Influenza A by PCR NEGATIVE NEGATIVE Final   Influenza B by PCR NEGATIVE NEGATIVE Final    Comment: (NOTE) The Xpert Xpress SARS-CoV-2/FLU/RSV plus assay is intended as an aid in the diagnosis of influenza from Nasopharyngeal swab specimens and should not be used as a sole basis for treatment. Nasal washings and aspirates are unacceptable for Xpert Xpress SARS-CoV-2/FLU/RSV testing.  Fact Sheet for Patients: EntrepreneurPulse.com.au  Fact Sheet for Healthcare Providers: IncredibleEmployment.be  This test is not yet approved or cleared by the [place] FDA and has been authorized for detection and/or diagnosis of SARS-CoV-2 by FDA under an Emergency Use Authorization (EUA). This EUA will remain in effect (meaning this test can be used)  for the duration of the COVID-19 declaration under Section 564(b)(1) of the Act, 21 U.S.C. section 360bbb-3(b)(1), unless the authorization is terminated or revoked.  Performed at [facility], [place] [address], [place], [place] [address]   MRSA Next Gen by PCR, Nasal     Status: None   Collection Time: 11/09/21  2:29 AM   Specimen: Nasal Mucosa; Nasal Swab  Result Value Ref Range Status   MRSA by PCR Next Gen NOT DETECTED NOT DETECTED Final    Comment: (NOTE) The GeneXpert MRSA Assay (FDA approved for NASAL specimens only), is one component of a comprehensive MRSA colonization surveillance program. It is not intended to diagnose MRSA infection nor to guide or  monitor treatment for MRSA infections. Test performance is not FDA approved in patients less than 51 years old. Performed at [facility], [place] [address], [place], [place] [address]   Urine Culture     Status: Abnormal   Collection Time: 11/09/21  9:44 AM   Specimen: Urine, Clean Catch  Result Value Ref Range Status   Specimen Description   Final    URINE, CLEAN CATCH Performed at [facility], [place] [address] [place], [place] [address]    Special Requests   Final    NONE Performed at [facility], [place] [address], [place], [place] [address]    Culture (A)  Final    <10,000 COLONIES/mL INSIGNIFICANT GROWTH Performed at [place] [address], [place], [place] [address]    Report Status 11/10/2021 FINAL  Final  C Difficile Quick Screen w PCR reflex     Status: None   Collection Time: 11/10/21 11:08 AM   Specimen: STOOL  Result Value Ref Range Status   C Diff antigen NEGATIVE NEGATIVE Final   C Diff toxin NEGATIVE NEGATIVE Final   C Diff interpretation No C. difficile detected.  Final    Comment: Performed at [facility], [place] [address] [place], [place] [address]  Urine Culture     Status: Abnormal (Preliminary  result)   Collection Time: 11/10/21  6:07 PM   Specimen: Urine, Catheterized  Result Value Ref Range Status   Specimen Description   Final    URINE, CATHETERIZED Performed at [place] [address], [place], [place] [address]    Special Requests   Final    NONE Performed at [facility], [place] [address], [place], [place] [address]    Culture (A)  Final    80,000 COLONIES/mL ENTEROBACTER SPECIES IDENTIFICATION AND SUSCEPTIBILITIES TO FOLLOW Performed at [facility], [place] [address] [place], [place] [address]    Report Status PENDING  Incomplete  Culture, blood (routine x 2)     Status: None (Preliminary result)   Collection Time: 11/11/21  8:17 AM   Specimen: BLOOD  Result Value Ref Range Status   Specimen Description   Final    BLOOD BLOOD RIGHT HAND Performed at [place] [address] [place], [place] [address]    Special Requests   Final    BOTTLES DRAWN AEROBIC ONLY Blood Culture adequate volume Performed at [place] [address], [place], [place] [address]    Culture   Final    NO GROWTH 1 DAY Performed at [facility], [place] [address] [place], [place] [address]    Report Status PENDING  Incomplete  Culture, blood (routine x 2)     Status: None (Preliminary result)   Collection Time: 11/11/21  8:17 AM   Specimen: BLOOD  Result Value Ref Range Status   Specimen Description   Final    BLOOD BLOOD RIGHT HAND Performed at [place] [address], [place], [place] [address]    Special Requests   Final    BOTTLES DRAWN AEROBIC ONLY Blood Culture adequate volume Performed at [place] [address], [place], [place] [address]    Culture   Final    NO GROWTH 1 DAY Performed at [facility], [place] [address], [place],  [address]    Report Status PENDING  Incomplete  Radiology Studies: US  RENAL  Result Date: 2021/11/19 CLINICAL DATA:  Acute renal failure. EXAM: RENAL / URINARY TRACT ULTRASOUND COMPLETE COMPARISON:  CT renal 03/08/2021, chest x-ray 11/09/2021 FINDINGS: Right Kidney: Renal measurements: 12 x 4.7 x 4.3 cm = volume: 126 mL. Echogenicity within normal limits. No mass. Persistent mild hydronephrosis visualized. Left Kidney: Renal measurements: 12.2 x 6.6 x 4.8 cm = volume: 190 mL. Echogenicity within normal limits. No mass. Persistent moderate hydronephrosis visualized. Query parapelvic cysts versus more likely dilated renal pelvis. Urinary bladder: Layering debris within the urinary bladder lumen. Otherwise appears normal for degree of bladder distention. Other: At least moderate right and small left pleural effusions. There is a 4.6 x 4.2 x 5 cm cystic lesion within the pelvis posterior to the urinary bladder. IMPRESSION: 1. Persistent mild right hydronephrosis. 2. Persistent moderate left hydronephrosis. 3. A 4.6 x 4.2 x 5 cm cystic lesion within the pelvis posterior to the urinary bladder which may correlate to a prior identified left adnexal cyst - please see pelvic ultrasound 03/08/2021 for further details and consider follow-up pelvic ultrasound as per recommendations on this prior study. 4. At least moderate right and small left pleural effusions. Electronically Signed   By: Iven Finn M.D.   On: Nov 19, 2021 17:04    Scheduled Meds:  Chlorhexidine Gluconate Cloth  6 each Topical Daily   LORazepam  2 mg Intravenous Once   mouth rinse  15 mL Mouth Rinse BID   rosuvastatin  10 mg Oral Daily   Continuous Infusions:  ceFEPime (MAXIPIME) IV Stopped (11/11/21 2228)   dextrose 5 % and 0.9% NaCl 75 mL/hr at 11/12/21 1200     LOS: 3 days   Marylu Lund, MD [facility] Pager On Amion  If 7PM-7AM, please contact night-coverage 11/12/2021, 1:37 PM

## 2021-11-12 NOTE — Progress Notes (Signed)
Occupational Therapy Treatment Patient Details Name: Kara Mullins MRN: [id number] DOB: [birth year] Today's Date: 11/12/2021   History of present illness 71 y.o. female with medical history significant of chronic combined systolic and diastolic heart failure, generalized anxiety disorder, hyperlipidemia, hypertension, stage IIIb CKD who was brought to the emergency department due to inability to swallow less than an hour before arrival. While in ED, pt had episode of syncope, found to be hypothermic and evidence suggesting UTI. Dx of sepsis.   OT comments  Patient was noted to be increasingly lethargic on this date. Patient was also indicating being itchy around L IV site with patient stopped on pulling on tape to hold IV in place multiple times during session. Patient's daughter was present for session. Patient was able to sit EOB for about 8 mins with min guard and participate in grooming tasks. Patient would continue to benefit from skilled OT services at this time while admitted and after d/c to address noted deficits in order to improve overall safety and independence in ADLs.     Recommendations for follow up therapy are one component of a multi-disciplinary discharge planning process, led by the attending physician.  Recommendations may be updated based on patient status, additional functional criteria and insurance authorization.    Follow Up Recommendations  Home health OT    Assistance Recommended at Discharge Frequent or constant Supervision/Assistance  Patient can return home with the following  A little help with walking and/or transfers;A little help with bathing/dressing/bathroom;Assistance with cooking/housework   Equipment Recommendations  Other (comment) (TBD)    Recommendations for Other Services      Precautions / Restrictions Precautions Precautions: Fall Precaution Comments: pt/daughter report one loss of balance ~3 weeks ago, pt slid against a wall but didn't fall  to floor Restrictions Weight Bearing Restrictions: No       Mobility Bed Mobility Overal bed mobility: Needs Assistance Bed Mobility: Supine to Sit, Sit to Supine     Supine to sit: HOB elevated, Min assist Sit to supine: Max assist   General bed mobility comments: physical assist was significant for return to bed on this date. patient needed physical assist for BLE and trunk.    Transfers                         Balance Overall balance assessment: Needs assistance Sitting-balance support: No upper extremity supported, Feet supported Sitting balance-Leahy Scale: Fair                                     ADL either performed or assessed with clinical judgement   ADL Overall ADL's : Needs assistance/impaired     Grooming: Set up;Sitting Grooming Details (indicate cue type and reason): EOB with increased time. patient's daughter was educated on letting patient trial tasks prior to completing them for her. patients daughter verbalized understanding.                               General ADL Comments: patient was min A for supine to sit on edge of bed with BLE physical assistance. patient needed increased time to process all cues. patient was able twash face and participate in sitting EOB for about 8 mins on this date with noted lethargicness.    Extremity/Trunk Assessment  Vision       Perception     Praxis      Cognition Arousal/Alertness: Lethargic, Suspect due to medications Behavior During Therapy: WFL for tasks assessed/performed Overall Cognitive Status: Impaired/Different from baseline                                 General Comments: unable to truely assess with patient having had ativan at 3am on this date.        Exercises      Shoulder Instructions       General Comments      Pertinent Vitals/ Pain       Pain Assessment Pain Assessment: No/denies pain  Home Living                                           Prior Functioning/Environment              Frequency  Min 2X/week        Progress Toward Goals  OT Goals(current goals can now be found in the care plan section)  Progress towards OT goals: OT to reassess next treatment     Plan Discharge plan remains appropriate    Co-evaluation                 AM-PAC OT "6 Clicks" Daily Activity     Outcome Measure   Help from another person eating meals?: A Little Help from another person taking care of personal grooming?: A Little Help from another person toileting, which includes using toliet, bedpan, or urinal?: A Lot Help from another person bathing (including washing, rinsing, drying)?: A Lot Help from another person to put on and taking off regular upper body clothing?: A Little Help from another person to put on and taking off regular lower body clothing?: A Lot 6 Click Score: 15    End of Session    OT Visit Diagnosis: Unsteadiness on feet (R26.81);Muscle weakness (generalized) (M62.81)   Activity Tolerance Patient limited by lethargy   Patient Left in bed;with call bell/phone within reach;with bed alarm set;with family/visitor present   Nurse Communication Mobility status        Time: 1610-9604 OT Time Calculation (min): 17 min  Charges: OT General Charges $OT Visit: 1 Visit OT Treatments $Self Care/Home Management : 8-22 mins  Jackelyn Poling OTR/L, MS Acute Rehabilitation Department Office# [phone] Pager# [phone]   Kara Mullins 11/12/2021, 3:10 PM

## 2021-11-13 ENCOUNTER — Inpatient Hospital Stay (HOSPITAL_COMMUNITY): Payer: Medicare (Managed Care)

## 2021-11-13 LAB — COMPREHENSIVE METABOLIC PANEL
ALT: 15 U/L (ref 0–44)
AST: 27 U/L (ref 15–41)
Albumin: 1.9 g/dL — ABNORMAL LOW (ref 3.5–5.0)
Alkaline Phosphatase: 111 U/L (ref 38–126)
Anion gap: 8 (ref 5–15)
BUN: 72 mg/dL — ABNORMAL HIGH (ref 8–23)
CO2: 17 mmol/L — ABNORMAL LOW (ref 22–32)
Calcium: 7.1 mg/dL — ABNORMAL LOW (ref 8.9–10.3)
Chloride: 115 mmol/L — ABNORMAL HIGH (ref 98–111)
Creatinine, Ser: 4.5 mg/dL — ABNORMAL HIGH (ref 0.44–1.00)
GFR, Estimated: 10 mL/min — ABNORMAL LOW (ref >60)
Glucose, Bld: 95 mg/dL (ref 70–99)
Potassium: 3.6 mmol/L (ref 3.5–5.1)
Sodium: 140 mmol/L (ref 135–145)
Total Bilirubin: 0.5 mg/dL (ref 0.3–1.2)
Total Protein: 5.5 g/dL — ABNORMAL LOW (ref 6.5–8.1)

## 2021-11-13 LAB — GLUCOSE, CAPILLARY
Glucose-Capillary: 95 mg/dL (ref 70–99)
Glucose-Capillary: 95 mg/dL (ref 70–99)
Glucose-Capillary: 134 mg/dL — ABNORMAL HIGH (ref 70–99)
Glucose-Capillary: 144 mg/dL — ABNORMAL HIGH (ref 70–99)
Glucose-Capillary: 150 mg/dL — ABNORMAL HIGH (ref 70–99)
Glucose-Capillary: 128 mg/dL — ABNORMAL HIGH (ref 70–99)

## 2021-11-13 LAB — URINE CULTURE: Culture: 80000 — AB

## 2021-11-13 LAB — CBC
HCT: 27.3 % — ABNORMAL LOW (ref 36.0–46.0)
Hemoglobin: 8.8 g/dL — ABNORMAL LOW (ref 12.0–15.0)
MCH: 27.1 pg (ref 26.0–34.0)
MCHC: 32.2 g/dL (ref 30.0–36.0)
MCV: 84 fL (ref 80.0–100.0)
Platelets: 86 10*3/uL — ABNORMAL LOW (ref 150–400)
RBC: 3.25 MIL/uL — ABNORMAL LOW (ref 3.87–5.11)
RDW: 18.1 % — ABNORMAL HIGH (ref 11.5–15.5)
WBC: 3.9 10*3/uL — ABNORMAL LOW (ref 4.0–10.5)
nRBC: 0.5 % — ABNORMAL HIGH (ref 0.0–0.2)

## 2021-11-13 LAB — PROCALCITONIN: Procalcitonin: 0.24 ng/mL

## 2021-11-13 MED ORDER — BACITRACIN-POLYMYXIN B 500-10000 UNIT/GM OP OINT
TOPICAL_OINTMENT | Freq: Every day | OPHTHALMIC | Status: DC
  Administered 2021-11-13 – 2021-11-16 (×2): 1 via OPHTHALMIC
  Filled 2021-11-13: qty 3.5

## 2021-11-13 MED ORDER — ENSURE ENLIVE PO LIQD
237.0000 mL | Freq: Two times a day (BID) | ORAL | Status: DC
  Administered 2021-11-13 – 2021-11-23 (×12): 237 mL via ORAL

## 2021-11-13 MED ORDER — SODIUM BICARBONATE 650 MG PO TABS
650.0000 mg | ORAL_TABLET | Freq: Three times a day (TID) | ORAL | Status: DC
  Administered 2021-11-13 – 2021-11-23 (×24): 650 mg via ORAL
  Filled 2021-11-13 (×25): qty 1

## 2021-11-13 MED ORDER — SODIUM CHLORIDE 0.9 % IV BOLUS
500.0000 mL | Freq: Once | INTRAVENOUS | Status: AC
  Administered 2021-11-13: 500 mL via INTRAVENOUS

## 2021-11-13 MED ORDER — SODIUM CHLORIDE 0.9 % IV SOLN
1.0000 g | Freq: Two times a day (BID) | INTRAVENOUS | Status: DC
  Administered 2021-11-13 – 2021-11-14 (×3): 1 g via INTRAVENOUS
  Filled 2021-11-13 (×4): qty 1

## 2021-11-13 MED ORDER — ADULT MULTIVITAMIN W/MINERALS CH
1.0000 | ORAL_TABLET | Freq: Every day | ORAL | Status: DC
  Administered 2021-11-13 – 2021-11-23 (×9): 1 via ORAL
  Filled 2021-11-13 (×10): qty 1

## 2021-11-13 MED ORDER — HALOPERIDOL LACTATE 5 MG/ML IJ SOLN
1.0000 mg | Freq: Once | INTRAMUSCULAR | Status: AC
  Administered 2021-11-13: 1 mg via INTRAMUSCULAR
  Filled 2021-11-13: qty 1

## 2021-11-13 NOTE — Progress Notes (Signed)
Pharmacy Antibiotic Note  Kara Mullins is a 71 y.o. female admitted on 11/08/2021 with inability to swallow and found to be hypothermic with possible UTI.Marland Kitchen  Pharmacy has been consulted to transition patient from cefepime to meropenem as patient remains hypotensive and hypothermic on cefepime.   Plan: Meropenem 1 g iv q 12 hours.   Will f/u renal function, culture results, and clinical course  Height: 5\' 5"  (165.1 cm) Weight: 79.8 kg (175 lb 14.8 oz) IBW/kg (Calculated) : 57  Temp (24hrs), Avg:96.9 F (36.1 C), Min:94.6 F (34.8 C), Max:98.4 F (36.9 C)  Recent Labs  Lab 11/08/21 2357 11/09/21 0022 11/09/21 0159 11/10/21 0240 11/11/21 0243 11/12/21 0254 11/13/21 0300  WBC 2.8*  --   --  3.1* 3.8* 3.7* 3.9*  CREATININE 4.10*  --   --  4.23* 4.33* 4.29* 4.50*  LATICACIDVEN  --  0.5 0.5  --   --   --   --     Estimated Creatinine Clearance: 12.3 mL/min (A) (by C-G formula based on SCr of 4.5 mg/dL (H)).    No Known Allergies  Thank you for allowing pharmacy to be a part of this patients care.  Ulice Dash D 11/13/2021 11:52 AM

## 2021-11-13 NOTE — NC FL2 (Signed)
[place] LEVEL OF CARE SCREENING TOOL     IDENTIFICATION  Patient Name: Kara Mullins Birthdate: [birth year] Sex: female Admission Date (Current Location): 11/08/2021  [facility] and [place] Number:  Herbalist and Address:  [facility],  [address] [place], [place]      Provider Number: [id number]  Attending Physician Name and Address:  Donne Hazel, MD  Relative Name and Phone Number:  JAHNE, KRUKOWSKI Daughter   [phone]    Current Level of Care: Hospital Recommended Level of Care: [place] Prior Approval Number:    Date Approved/Denied:   PASRR Number: [id number]  Discharge Plan: SNF    Current Diagnoses: Patient Active Problem List   Diagnosis Date Noted   Hypothermia 11/09/2021   Hyperlipidemia 11/09/2021   Chronic combined systolic and diastolic congestive heart failure ([place]) 11/09/2021   Stage 3b chronic kidney disease (CKD) ([place]) 11/09/2021   Pancytopenia ([place]) 11/09/2021   Severe protein-calorie malnutrition ([place]) 11/09/2021   Acute combined systolic and diastolic HF (heart failure) ([place])    AKI (acute kidney injury) ([place])    CHF exacerbation ([place]) 03/06/2021   Essential hypertension    Hypertensive urgency 03/05/2021   Acute CHF (congestive heart failure) ([place]) 03/05/2021   Nausea and vomiting 03/05/2021   Elevated troponin level not due myocardial infarction 03/05/2021   Elevated d-dimer 03/05/2021   Generalized anxiety disorder 03/05/2021   Acute congestive heart failure ([place]) 03/05/2021    Orientation RESPIRATION BLADDER Height & Weight     Self  Normal Incontinent Weight: 178 lb 9.2 oz (81 kg) (1 pillow, 1 sheet, 1 blanket) Height:  5\' 5"  (165.1 cm)  BEHAVIORAL SYMPTOMS/MOOD NEUROLOGICAL BOWEL NUTRITION STATUS      Incontinent Diet (Renal diet)  AMBULATORY STATUS COMMUNICATION OF NEEDS Skin   Limited Assist Verbally Surgical wounds                       Personal Care  Assistance Level of Assistance  Bathing, Dressing, Feeding Bathing Assistance: Limited assistance Feeding assistance: Independent Dressing Assistance: Limited assistance     Functional Limitations Info  Sight, Hearing, Speech Sight Info: Adequate Hearing Info: Adequate Speech Info: Adequate    SPECIAL CARE FACTORS FREQUENCY  PT (By licensed PT), OT (By licensed OT)     PT Frequency: Minimum 5x a week OT Frequency: Minimum 5x a week            Contractures Contractures Info: Not present    Additional Factors Info  Code Status, Allergies Code Status Info: Full Code Allergies Info: No Known Allergies           Current Medications (11/13/2021):  This is the current hospital active medication list Current Facility-Administered Medications  Medication Dose Route Frequency Provider Last Rate Last Admin   acetaminophen (TYLENOL) tablet 650 mg  650 mg Oral Q6H PRN Donne Hazel, MD   650 mg at 11/13/21 1541   Or   acetaminophen (TYLENOL) suppository 650 mg  650 mg Rectal Q6H PRN Donne Hazel, MD       ALPRAZolam Duanne Moron) tablet 0.25 mg  0.25 mg Oral BID PRN Donne Hazel, MD   0.25 mg at 11/13/21 1540   Chlorhexidine Gluconate Cloth 2 % PADS 6 each  6 each Topical Daily Donne Hazel, MD   6 each at 11/13/21 0826   dextrose 5 %-0.9 % sodium chloride infusion   Intravenous Continuous Donne Hazel, MD 75 mL/hr  at 11/13/21 1648 New Bag at 11/13/21 1648   feeding supplement (ENSURE ENLIVE / ENSURE PLUS) liquid 237 mL  237 mL Oral BID BM Donne Hazel, MD   237 mL at 11/13/21 1429   hydrALAZINE (APRESOLINE) injection 5 mg  5 mg Intravenous Q4H PRN Donne Hazel, MD   5 mg at 11/13/21 0510   MEDLINE mouth rinse  15 mL Mouth Rinse BID Donne Hazel, MD   15 mL at 11/13/21 0826   meropenem (MERREM) 1 g in sodium chloride 0.9 % 100 mL IVPB  1 g Intravenous Q12H Donne Hazel, MD   Stopped at 11/13/21 1321   multivitamin with minerals tablet 1 tablet  1 tablet Oral Daily  Donne Hazel, MD   1 tablet at 11/13/21 1541   ondansetron (ZOFRAN) tablet 4 mg  4 mg Oral Q6H PRN Donne Hazel, MD       Or   ondansetron [facility]) injection 4 mg  4 mg Intravenous Q6H PRN Donne Hazel, MD       rosuvastatin (CRESTOR) tablet 10 mg  10 mg Oral Daily Donne Hazel, MD   10 mg at 11/13/21 5830   sodium bicarbonate tablet 650 mg  650 mg Oral TID Elmarie Shiley, MD   650 mg at 11/13/21 1541     Discharge Medications: Please see discharge summary for a list of discharge medications.  Relevant Imaging Results:  Relevant Lab Results:   Additional Information SSN  [id number]  Ross Ludwig, LCSW

## 2021-11-13 NOTE — Progress Notes (Signed)
Initial Nutrition Assessment  DOCUMENTATION CODES:   Not applicable  INTERVENTION:  - liberalize diet from Renal with 1.2 L fluid restriction to 2 gram Na with 1.2 L fluid restriction (cleared by MD). - will order Ensure Enlive po BID, each supplement provides 350 kcal and 20 grams of protein. - will order 1 tablet multivitamin with minerals/day. - re-weigh patient today. - complete NFPE when feasible.    NUTRITION DIAGNOSIS:   Inadequate oral intake related to dysphagia, decreased appetite, lethargy/confusion as evidenced by per patient/family report.  GOAL:   Patient will meet greater than or equal to 90% of their needs  MONITOR:   PO intake, Supplement acceptance, Labs, Weight trends  REASON FOR ASSESSMENT:   Consult Assessment of nutrition requirement/status, Poor PO  ASSESSMENT:   71 y.o. female with medical history of CHF, anxiety, HLD, HTN, stage 3 CKD. She presented to the ED from home. Her daughter reported that patient reported inability to swallow and difficulty verbalizing. Her daughter reported that patient has been less interactive for the 1 week PTA. She was admitted for sepsis 2/2 UTI.  Patient laying in bed at the time of RD visit with daughter at bedside.  Able to talk with RN prior to entering patient's room. She requests that patient not be woken up. She shares that patient was able to drink a bottle of Ensure earlier this AM.   Patient's daughter able to provide all information. She reports that patient drank 3/4 of a bottle of Ensure and had a bite of grits for breakfast this AM. She shares that patient slept the majority of the day yesterday after being up nearly all night the night before. Patient sleeping throughout RD visit today.   For a short amount of time the day of presentation to the ED patient had told daughter that she was unable to swallow. Patient continued to drink fluids with no noted difficulty and daughter was unable to elicit further  details about difficulty from patient. Patient has not had any episodes like this in the past.   Patient got dentures in 2020 but often does not wear them due to wearing a mask or being at home. She typically does not wear them even while eating and daughter is unsure if dentures fit well or would impact chewing ability. She cooks most of patient's food and patient requests that foods be softer (such as increasing cooking time or steaming vegetables longer).  Daughter does the grocery shopping and is mindful of sodium content of packaged foods and selects lower sodium options. She does not add salt to patient's food when cooking but will sometimes sprinkle a small amount of sea salt on the top of a dish so patient thinks there is salt throughout the item.   Patient has a cane and a rolling walker with a seat at home. Over the past few months she has become physically tired with walking longer distances, such as at [employer]. Patient has chronic BLE edema. Daughter feels that degree of edema today is less than usual.   Weight on 1/27 was 176 lb. Weight had been stable (166-168 lb) from 05/07/21-11/06/21.  Patient is noted to have non-pitting edema to BUE and mild pitting edema to BLE documented in the edema section of flow sheet. She is noted to be +5.95 L since admission.   Labs reviewed; CBGs: 95 mg/dl x2, Cl: 115 mmol/l, BUN: 72 mg/dl, creatinine: 4.5 mg/dl,  Ca: 7.1 mg/dl, GFR: 10 ml/min.  Medications reviewed.  IVF;  D5-NS @ 75 ml/hr (306 kcal/24 hrs).    NUTRITION - FOCUSED PHYSICAL EXAM:  Able to talk with RN prior to entering patient's room. RN shares that patient is sleeping and requests that patient not be woken up. Deferred.   Diet Order:   Diet Order             Diet 2 gram sodium Room service appropriate? Yes; Fluid consistency: Thin; Fluid restriction: 1200 mL Fluid  Diet effective now                   EDUCATION NEEDS:   Education needs have been addressed  Skin:   Skin Assessment: Skin Integrity Issues: Skin Integrity Issues:: Other (Comment) Other: MASD to sacrum  Last BM:  1/30 (type 7 x2)  Height:   Ht Readings from Last 1 Encounters:  11/09/21 5\' 5"  (1.651 m)    Weight:   Wt Readings from Last 1 Encounters:  11/09/21 79.8 kg     Estimated Nutritional Needs:  Kcal:  1900-2100 kcal Protein:  95-110 grams Fluid:  >/= 1.9 L/day      Jarome Matin, MS, RD, LDN Inpatient Clinical Dietitian RD pager # available in [place]  After hours/weekend pager # available in [facility]

## 2021-11-13 NOTE — Progress Notes (Signed)
Patient ID: Rosaly Labarbera, female   DOB: 08/16/1951, 71 y.o.   MRN: [id number] [facility] Progress Note   Assessment/ Plan:   1. Acute kidney Injury on chronic kidney disease stage IIIb: Baseline creatinine ranging 1.3-1.6 in mid 2022 with current acute kidney injury that is suspected to be likely from ATN but cannot entirely rule out kidney injury associated with plasma cell dyscrasia.  Charted as nonoliguric overnight and with minimal worsening of renal function; will continue to follow expectantly with supportive management as per declining clinical status over the past year would favor against chronic hemodialysis. 2.  Non-Anion gap metabolic acidosis: Secondary to acute kidney injury superimposed on chronic kidney disease and likely plasma cell dyscrasia.  Sodium bicarbonate started 650 mg 3 times a day. 3.  Pancytopenia: Likely associated with sepsis but cannot entirely rule out the impact of plasma cell dyscrasia on this process.  Remains on broad-spectrum intravenous antibiotics. 4.  Hypertension: Blood pressures intermittently elevated, continue to avoid RAAS blockade in the setting of acute kidney injury. 5.  History of combined systolic/diastolic congestive heart failure: With some seemingly chronic pedal edema but without any respiratory findings to prompt need for diuresis in the setting of AKI.  Subjective:   Without acute events overnight, I had a discussion with her daughter to update her of her mother's clinical status/labs today.   Objective:   BP (!) 92/47 Comment: made MD aware   Pulse 63    Temp (!) 96.1 F (35.6 C)    Resp 11    Ht 5\' 5"  (1.651 m)    Wt 79.8 kg    SpO2 100%    BMI 29.28 kg/m   Intake/Output Summary (Last 24 hours) at 11/13/2021 1235 Last data filed at 11/13/2021 1200 Gross per 24 hour  Intake 1856.9 ml  Output 530 ml  Net 1326.9 ml   Weight change:   Physical Exam: Gen: Somnolent resting comfortably in bed, daughter at bedside CVS:  Pulse regular rhythm, normal rate, S1 and S2 normal Resp: Poor inspiratory effort with decreased breath sounds over bases, no rales/rhonchi Abd: Soft, obese, nontender, bowel sounds normal Ext: Trace bilateral pitting pretibial edema, 1-2+ hip edema.  Imaging: No results found.  Labs: BMET Recent Labs  Lab 11/06/21 1617 11/08/21 2357 11/10/21 0240 11/11/21 0243 11/12/21 0254 11/13/21 0300  NA 143 142 137 139 139 140  K 3.7 4.5 3.5 3.3* 3.8 3.6  CL 113* 115* 113* 113* 114* 115*  CO2 24 19* 18* 18* 17* 17*  GLUCOSE 148* 118* 55* 80 57* 95  BUN 64* 68* 68* 70* 69* 72*  CREATININE 4.06* 4.10* 4.23* 4.33* 4.29* 4.50*  CALCIUM 7.2* 7.0* 6.6* 6.7* 7.0* 7.1*   CBC Recent Labs  Lab 11/06/21 1617 11/08/21 2357 11/10/21 0240 11/11/21 0243 11/12/21 0254 11/13/21 0300  WBC 2.8* 2.8* 3.1* 3.8* 3.7* 3.9*  NEUTROABS 2,041 2.2  --   --   --   --   HGB 9.6* 9.1* 7.7* 8.4* 8.2* 8.8*  HCT 29.3* 28.1* 24.8* 25.8* 26.3* 27.3*  MCV 81.6 83.6 87.3 83.8 86.5 84.0  PLT CANCELED 56* 46* 53* 61* 86*    Medications:     Chlorhexidine Gluconate Cloth  6 each Topical Daily   feeding supplement  237 mL Oral BID BM   mouth rinse  15 mL Mouth Rinse BID   multivitamin with minerals  1 tablet Oral Daily   rosuvastatin  10 mg Oral Daily   Elmarie Shiley, MD 11/13/2021, 12:35  PM

## 2021-11-13 NOTE — Progress Notes (Signed)
Physical Therapy Treatment Patient Details Name: Kara Mullins MRN: [id number] DOB: [birth year] Today's Date: 11/13/2021   History of Present Illness 71 y.o. female with medical history significant of chronic combined systolic and diastolic heart failure, generalized anxiety disorder, hyperlipidemia, hypertension, stage IIIb CKD who was brought to the emergency department due to inability to swallow less than an hour before arrival. While in ED, pt had episode of syncope, found to be hypothermic and evidence suggesting UTI. Dx of sepsis.    PT Comments    Pt seen in step down ICU room # 1235. Daughter Baxter Flattery and Son in [place] present and assisted.  Pt has had a SIGNIFICANT MOBILITY DECLINE.  Assisted OOB to amb was very difficult.  General bed mobility comments: pt required increased assist to transition to EOB using bed pad to swival and scoot.  Poor coolapsed sitting posture and overtly weak.  Required Mod/Min Assist to prevent LOB.General transfer comment: pt required + 2 side by side Max Assist to partially rise from bed.  Pt offered no more than 15%.  Slow/sluggish/groggy.  LOB all planes.  Poor forward flex posture.  Overtly weak.General Gait Details: significant decline from last session 2 days ago pt amb 35 feet.  This session pt was unable to fully support self and unable to take functional steps.  Only able a few short/shuffled steps with full support upper body with recliner following closely behind. Will update LPT on pt's performance.  Pt may be unable to return home and need SNF.   Recommendations for follow up therapy are one component of a multi-disciplinary discharge planning process, led by the attending physician.  Recommendations may be updated based on patient status, additional functional criteria and insurance authorization.  Follow Up Recommendations  Skilled nursing-short term rehab (<3 hours/day)     Assistance Recommended at Discharge Intermittent  Supervision/Assistance  Patient can return home with the following A little help with walking and/or transfers;Assistance with cooking/housework;A little help with bathing/dressing/bathroom;Assist for transportation;Direct supervision/assist for medications management;Direct supervision/assist for financial management;Help with stairs or ramp for entrance   Equipment Recommendations  None recommended by PT    Recommendations for Other Services       Precautions / Restrictions Precautions Precautions: Fall     Mobility  Bed Mobility Overal bed mobility: Needs Assistance Bed Mobility: Supine to Sit     Supine to sit: Max assist, Total assist     General bed mobility comments: pt required increased assist to transition to EOB using bed pad to swival and scoot.  Poor coolapsed sitting posture and overtly weak.  Required Mod/Min Assist to prevent LOB.    Transfers Overall transfer level: Needs assistance Equipment used: Rolling walker (2 wheels) Transfers: Sit to/from Stand Sit to Stand: +2 safety/equipment, Total assist, Max assist, +2 physical assistance           General transfer comment: pt required + 2 side by side Max Assist to partially rise from bed.  Pt offered no more than 15%.  Slow/sluggish/groggy.  LOB all planes.  Poor forward flex posture.  Overtly weak.    Ambulation/Gait Ambulation/Gait assistance: Max assist, Total assist, +2 safety/equipment, +2 physical assistance Gait Distance (Feet): 2 Feet Assistive device: Rolling walker (2 wheels) Gait Pattern/deviations: Step-to pattern, Decreased step length - right, Decreased step length - left       General Gait Details: significant decline from last session 2 days ago pt amb 35 feet.  This session pt was unable to fully support self  and unable to take functional steps.  Only able a few short/shuffled steps with full support upper body with recliner following closely behind.   Stairs              Wheelchair Mobility    Modified Rankin (Stroke Patients Only)       Balance                                            Cognition Arousal/Alertness: Lethargic                                     General Comments: AxO x 3 but very groggy/delayed        Exercises      General Comments        Pertinent Vitals/Pain Pain Assessment Pain Assessment: Faces Faces Pain Scale: Hurts a little bit Pain Location: "my butt" Pain Descriptors / Indicators: Aching, Discomfort    Home Living                          Prior Function            PT Goals (current goals can now be found in the care plan section) Progress towards PT goals: Progressing toward goals    Frequency    Min 3X/week      PT Plan Current plan remains appropriate    Co-evaluation              AM-PAC PT "6 Clicks" Mobility   Outcome Measure  Help needed turning from your back to your side while in a flat bed without using bedrails?: Total Help needed moving from lying on your back to sitting on the side of a flat bed without using bedrails?: Total Help needed moving to and from a bed to a chair (including a wheelchair)?: Total Help needed standing up from a chair using your arms (e.g., wheelchair or bedside chair)?: Total Help needed to walk in hospital room?: Total Help needed climbing 3-5 steps with a railing? : Total 6 Click Score: 6    End of Session Equipment Utilized During Treatment: Gait belt Activity Tolerance: Patient limited by fatigue;Other (comment) Patient left: in chair;with chair alarm set;with call bell/phone within reach;with family/visitor present Nurse Communication: Mobility status PT Visit Diagnosis: Difficulty in walking, not elsewhere classified (R26.2)     Time: 4010-2725 PT Time Calculation (min) (ACUTE ONLY): 31 min  Charges:  $Gait Training: 8-22 mins $Therapeutic Activity: 8-22 mins                     Rica Koyanagi  PTA Acute  Rehabilitation Services Pager      [phone] Office      [phone]

## 2021-11-13 NOTE — Progress Notes (Signed)
PROGRESS NOTE    Kara Mullins  UUV:[id number] DOB: 1951-07-31 DOA: 11/08/2021 PCP: [name], NP    Brief Narrative:  71 y.o. female with medical history significant of chronic combined systolic and diastolic heart failure, generalized anxiety disorder, hyperlipidemia, hypertension, stage IIIb CKD who was brought to the emergency department due to inability to swallow less than an hour before arrival. While in ED, pt had episode of syncope, found to be hypothermic and evidence suggesting UTI. Pt was admitted for further work up  Assessment & Plan:   Principal Problem:   Hypothermia Active Problems:   Essential hypertension   AKI (acute kidney injury) ([place])   Hyperlipidemia   Chronic combined systolic and diastolic congestive heart failure (HCC)   Stage 3b chronic kidney disease (CKD) (HCC)   Pancytopenia (HCC)   Severe protein-calorie malnutrition (HCC)  Principal Problem:   Severe sepsis with UTI present on admit Presents with hypothermia, pancytopenia, ARF, toxic metabolic encephalopathy UA reviewed, suggestive of UTI Initially on cefepime and flagyl, later narrowed to cefepime monotherapy Procalcitonin trending down Clinically improved, however now with lower BP and again hypothermic needing Bair Hugger Urine cx pos for enterobacter, pending sensitivities Discussed with pharmacy, will broaden to meropenem today Ordered and reviewed repeat CXR, findings of stable cardiomegaly and vascular congestion   Active Problems:   Dysphagia/dysarthria MRI of the brain was negative.     Essential hypertension Holding diuretics given ARF Coreg now on hold, per below Cont to monitor BP and heart rate. On PRN hydralazine     AKI (acute kidney injury) ([place]) Superimposed on   Stage 3b chronic kidney disease (CKD) ([place]) Cont IVF hydration as tolerated Renal US reviewed. Findings of chronic B hydronephrosis. Chart reviewed, pt was seen by urology in 5/22 for hydronephrosis,  recs for no further w/u at that time Serial bladder scans suggesting outlet obstruction, now with indwelling cath Nephrology following, appreciate recs. Suspected ATN but cannot rule out kidney injury associated with plasma cell dyscrasia Cr up to 4.5 today Repeat bmet in AM    Pancytopenia ([place]) Suspect secondary to acute illness Blood counts trending down this AM Blood lines improving Repeat cbc in AM     Hyperlipidemia Continue rosuvastatin 10 mg p.o. daily.     Chronic combined systolic and diastolic congestive heart failure (HCC) No signs of decompensation. CXR with evidence of vascular congestion and cardiomegaly Holding furosemide, losartan and spironolactone given ARF Giving IVF given soft bp today On minimal O2     Severe protein-calorie malnutrition (HCC) Protein supplementation. Nutritional services evaluation.  Sinus Bradycardia -HR recently noted to be as low as the 30's, asymptomatic -Discussed with Cardiology who recommends holding coreg -HR has improved off coreg   DVT prophylaxis: SCD's Code Status: Full Family Communication: Pt in room, pt's daughter at bedside  Status is: Inpatient  Remains inpatient appropriate because: Severity of illness    Consultants:    Procedures:    Antimicrobials: Anti-infectives (From admission, onward)    Start     Dose/Rate Route Frequency Ordered Stop   11/13/21 1300  meropenem (MERREM) 1 g in sodium chloride 0.9 % 100 mL IVPB        1 g 200 mL/hr over 30 Minutes Intravenous Every 12 hours 11/13/21 1151     11/09/21 2200  ceFEPIme (MAXIPIME) 2 g in sodium chloride 0.9 % 100 mL IVPB  Status:  Discontinued        2 g 200 mL/hr over 30 Minutes Intravenous Every 24  hours 11/09/21 0323 11/13/21 1148   11/09/21 0322  vancomycin variable dose per unstable renal function (pharmacist dosing)  Status:  Discontinued         Does not apply See admin instructions 11/09/21 0323 11/09/21 1355   11/09/21 0215  ceFEPIme  (MAXIPIME) 2 g in sodium chloride 0.9 % 100 mL IVPB        2 g 200 mL/hr over 30 Minutes Intravenous  Once 11/09/21 0210 11/09/21 0302   11/09/21 0215  metroNIDAZOLE (FLAGYL) IVPB 500 mg  Status:  Discontinued        500 mg 100 mL/hr over 60 Minutes Intravenous Every 12 hours 11/09/21 0210 11/12/21 1128   11/09/21 0215  vancomycin (VANCOCIN) IVPB 1000 mg/200 mL premix        1,000 mg 200 mL/hr over 60 Minutes Intravenous  Once 11/09/21 0210 11/09/21 0339       Subjective: Not very conversant this AM, unable to assess  Objective: Vitals:   11/13/21 1200 11/13/21 1232 11/13/21 1240 11/13/21 1300  BP:  131/63  (!) 146/69  Pulse:  (!) 58  (!) 58  Resp: 11 12  12   Temp: (!) 96.1 F (35.6 C)   (!) 95.9 F (35.5 C)  TempSrc:      SpO2:  99%  100%  Weight:   81 kg   Height:        Intake/Output Summary (Last 24 hours) at 11/13/2021 1329 Last data filed at 11/13/2021 1200 Gross per 24 hour  Intake 1856.9 ml  Output 530 ml  Net 1326.9 ml    Filed Weights   11/09/21 0330 11/13/21 1240  Weight: 79.8 kg 81 kg    Examination: General exam: Awake, laying in bed, in nad Respiratory system: Normal respiratory effort, no wheezing Cardiovascular system: regular rate, s1, s2 Gastrointestinal system: Soft, nondistended, positive BS Central nervous system: CN2-12 grossly intact, strength intact Extremities: Perfused, no clubbing Skin: Normal skin turgor, no notable skin lesions seen Psychiatry: Mood normal // no visual hallucinations   Data Reviewed: I have personally reviewed following labs and imaging studies  CBC: Recent Labs  Lab 11/06/21 1617 11/08/21 2357 11/10/21 0240 11/11/21 0243 11/12/21 0254 11/13/21 0300  WBC 2.8* 2.8* 3.1* 3.8* 3.7* 3.9*  NEUTROABS 2,041 2.2  --   --   --   --   HGB 9.6* 9.1* 7.7* 8.4* 8.2* 8.8*  HCT 29.3* 28.1* 24.8* 25.8* 26.3* 27.3*  MCV 81.6 83.6 87.3 83.8 86.5 84.0  PLT CANCELED 56* 46* 53* 61* 86*    Basic Metabolic Panel: Recent  Labs  Lab 11/08/21 2357 11/10/21 0240 11/11/21 0243 11/12/21 0254 11/13/21 0300  NA 142 137 139 139 140  K 4.5 3.5 3.3* 3.8 3.6  CL 115* 113* 113* 114* 115*  CO2 19* 18* 18* 17* 17*  GLUCOSE 118* 55* 80 57* 95  BUN 68* 68* 70* 69* 72*  CREATININE 4.10* 4.23* 4.33* 4.29* 4.50*  CALCIUM 7.0* 6.6* 6.7* 7.0* 7.1*    GFR: Estimated Creatinine Clearance: 12.4 mL/min (A) (by C-G formula based on SCr of 4.5 mg/dL (H)). Liver Function Tests: Recent Labs  Lab 11/08/21 2357 11/10/21 0240 11/11/21 0243 11/12/21 0254 11/13/21 0300  AST 52* 24 25 25 27   ALT 22 18 17 16 15   ALKPHOS 154* 115 124 110 111  BILITOT 1.0 0.7 0.7 0.7 0.5  PROT 6.1* 5.3* 5.4* 5.1* 5.5*  ALBUMIN 2.2* 2.5* 2.2* 2.0* 1.9*    Recent Labs  Lab 11/08/21 2357  LIPASE  65*    No results for input(s): AMMONIA in the last 168 hours. Coagulation Profile: No results for input(s): INR, PROTIME in the last 168 hours. Cardiac Enzymes: No results for input(s): CKTOTAL, CKMB, CKMBINDEX, TROPONINI in the last 168 hours. BNP (last 3 results) No results for input(s): PROBNP in the last 8760 hours. HbA1C: No results for input(s): HGBA1C in the last 72 hours. CBG: Recent Labs  Lab 11/12/21 1915 11/12/21 2336 11/13/21 0333 11/13/21 0822 11/13/21 1205  GLUCAP 95 97 95 95 134*    Lipid Profile: No results for input(s): CHOL, HDL, LDLCALC, TRIG, CHOLHDL, LDLDIRECT in the last 72 hours. Thyroid Function Tests: No results for input(s): TSH, T4TOTAL, FREET4, T3FREE, THYROIDAB in the last 72 hours.  Anemia Panel: No results for input(s): VITAMINB12, FOLATE, FERRITIN, TIBC, IRON, RETICCTPCT in the last 72 hours. Sepsis Labs: Recent Labs  Lab 11/09/21 0022 11/09/21 0159 11/09/21 0210 11/10/21 0240 11/11/21 1414 11/12/21 0254 11/13/21 0300  PROCALCITON  --   --    < > 0.59 0.29 0.27 0.24  LATICACIDVEN 0.5 0.5  --   --   --   --   --    < > = values in this interval not displayed.     Recent Results (from  the past 240 hour(s))  Resp Panel by RT-PCR (Flu A&B, Covid) Nasopharyngeal Swab     Status: None   Collection Time: 11/09/21 12:13 AM   Specimen: Nasopharyngeal Swab; Nasopharyngeal(NP) swabs in vial transport medium  Result Value Ref Range Status   SARS Coronavirus 2 by RT PCR NEGATIVE NEGATIVE Final    Comment: (NOTE) SARS-CoV-2 target nucleic acids are NOT DETECTED.  The SARS-CoV-2 RNA is generally detectable in upper respiratory specimens during the acute phase of infection. The lowest concentration of SARS-CoV-2 viral copies this assay can detect is 138 copies/mL. A negative result does not preclude SARS-Cov-2 infection and should not be used as the sole basis for treatment or other patient management decisions. A negative result may occur with  improper specimen collection/handling, submission of specimen other than nasopharyngeal swab, presence of viral mutation(s) within the areas targeted by this assay, and inadequate number of viral copies(<138 copies/mL). A negative result must be combined with clinical observations, patient history, and epidemiological information. The expected result is Negative.  Fact Sheet for Patients:  EntrepreneurPulse.com.au  Fact Sheet for Healthcare Providers:  IncredibleEmployment.be  This test is no t yet approved or cleared by the [place] FDA and  has been authorized for detection and/or diagnosis of SARS-CoV-2 by FDA under an Emergency Use Authorization (EUA). This EUA will remain  in effect (meaning this test can be used) for the duration of the COVID-19 declaration under Section 564(b)(1) of the Act, 21 U.S.C.section 360bbb-3(b)(1), unless the authorization is terminated  or revoked sooner.       Influenza A by PCR NEGATIVE NEGATIVE Final   Influenza B by PCR NEGATIVE NEGATIVE Final    Comment: (NOTE) The Xpert Xpress SARS-CoV-2/FLU/RSV plus assay is intended as an aid in the diagnosis of  influenza from Nasopharyngeal swab specimens and should not be used as a sole basis for treatment. Nasal washings and aspirates are unacceptable for Xpert Xpress SARS-CoV-2/FLU/RSV testing.  Fact Sheet for Patients: EntrepreneurPulse.com.au  Fact Sheet for Healthcare Providers: IncredibleEmployment.be  This test is not yet approved or cleared by the [place] FDA and has been authorized for detection and/or diagnosis of SARS-CoV-2 by FDA under an Emergency Use Authorization (EUA). This EUA will remain  in effect (meaning this test can be used) for the duration of the COVID-19 declaration under Section 564(b)(1) of the Act, 21 U.S.C. section 360bbb-3(b)(1), unless the authorization is terminated or revoked.  Performed at [facility], [place] [address], [place], [place] [address]   MRSA Next Gen by PCR, Nasal     Status: None   Collection Time: 11/09/21  2:29 AM   Specimen: Nasal Mucosa; Nasal Swab  Result Value Ref Range Status   MRSA by PCR Next Gen NOT DETECTED NOT DETECTED Final    Comment: (NOTE) The GeneXpert MRSA Assay (FDA approved for NASAL specimens only), is one component of a comprehensive MRSA colonization surveillance program. It is not intended to diagnose MRSA infection nor to guide or monitor treatment for MRSA infections. Test performance is not FDA approved in patients less than 74 years old. Performed at [facility], [place] [address], [place], [place] [address]   Urine Culture     Status: Abnormal   Collection Time: 11/09/21  9:44 AM   Specimen: Urine, Clean Catch  Result Value Ref Range Status   Specimen Description   Final    URINE, CLEAN CATCH Performed at [facility], [place] [address] [place], [place] [address]    Special Requests   Final    NONE Performed at [facility], [place] [address], [place], [place] [address]    Culture (A)  Final     <10,000 COLONIES/mL INSIGNIFICANT GROWTH Performed at [place] [address], [place], [place] [address]    Report Status 11/10/2021 FINAL  Final  C Difficile Quick Screen w PCR reflex     Status: None   Collection Time: 11/10/21 11:08 AM   Specimen: STOOL  Result Value Ref Range Status   C Diff antigen NEGATIVE NEGATIVE Final   C Diff toxin NEGATIVE NEGATIVE Final   C Diff interpretation No C. difficile detected.  Final    Comment: Performed at [facility], [place] [address] [place], [place] [address]  Urine Culture     Status: Abnormal (Preliminary result)   Collection Time: 11/10/21  6:07 PM   Specimen: Urine, Catheterized  Result Value Ref Range Status   Specimen Description   Final    URINE, CATHETERIZED Performed at [place] [address], [place], [place] [address]    Special Requests   Final    NONE Performed at [facility], [place] [address] [place], [place] [address]    Culture (A)  Final    80,000 COLONIES/mL ENTEROBACTER SPECIES IDENTIFICATION AND SUSCEPTIBILITIES TO FOLLOW Performed at [facility], [place] [address], [place], [place] [address]    Report Status PENDING  Incomplete  Culture, blood (routine x 2)     Status: None (Preliminary result)   Collection Time: 11/11/21  8:17 AM   Specimen: BLOOD  Result Value Ref Range Status   Specimen Description   Final    BLOOD BLOOD RIGHT HAND Performed at [place] [address] [place], [place] [address]    Special Requests   Final    BOTTLES DRAWN AEROBIC ONLY Blood Culture adequate volume Performed at [place] [address], [place],  [address]    Culture   Final    NO GROWTH 1 DAY Performed at [facility], [place] [address] [place],  [address]    Report Status PENDING  Incomplete  Culture, blood (routine x 2)     Status: None (Preliminary result)   Collection  Time: 11/11/21  8:17 AM   Specimen: BLOOD  Result Value Ref Range Status   Specimen Description   Final    BLOOD BLOOD RIGHT HAND Performed at [place] [address], [place], [place] [address]    Special Requests   Final    BOTTLES DRAWN AEROBIC ONLY Blood Culture adequate volume Performed at [place] [address], [place], [place] [address]    Culture   Final    NO GROWTH 1 DAY Performed at [facility], [place] [address] [place], [place] [address]    Report Status PENDING  Incomplete      Radiology Studies: DG CHEST PORT 1 VIEW  Result Date: 11/13/2021 CLINICAL DATA:  Sepsis. EXAM: PORTABLE CHEST 1 VIEW COMPARISON:  November 09, 2021. FINDINGS: Stable cardiomegaly with mild central pulmonary vascular congestion. Small bilateral pleural effusions are noted. Possible bibasilar pulmonary edema is noted. The visualized skeletal structures are unremarkable. IMPRESSION: Stable cardiomegaly with mild central pulmonary vascular congestion and possible bibasilar pulmonary edema and small pleural effusions. Electronically Signed   By: Marijo Conception M.D.   On: 11/13/2021 12:57    Scheduled Meds:  Chlorhexidine Gluconate Cloth  6 each Topical Daily   feeding supplement  237 mL Oral BID BM   mouth rinse  15 mL Mouth Rinse BID   multivitamin with minerals  1 tablet Oral Daily   rosuvastatin  10 mg Oral Daily   sodium bicarbonate  650 mg Oral TID   Continuous Infusions:  dextrose 5 % and 0.9% NaCl Stopped (11/13/21 1152)   meropenem (MERREM) IV 1 g (11/13/21 1251)     LOS: 4 days   Marylu Lund, MD [facility] Pager On Amion  If 7PM-7AM, please contact night-coverage 11/13/2021, 1:29 PM

## 2021-11-14 LAB — GLUCOSE, CAPILLARY
Glucose-Capillary: 109 mg/dL — ABNORMAL HIGH (ref 70–99)
Glucose-Capillary: 110 mg/dL — ABNORMAL HIGH (ref 70–99)
Glucose-Capillary: 111 mg/dL — ABNORMAL HIGH (ref 70–99)
Glucose-Capillary: 113 mg/dL — ABNORMAL HIGH (ref 70–99)
Glucose-Capillary: 113 mg/dL — ABNORMAL HIGH (ref 70–99)
Glucose-Capillary: 131 mg/dL — ABNORMAL HIGH (ref 70–99)

## 2021-11-14 LAB — CBC
HCT: 26.4 % — ABNORMAL LOW (ref 36.0–46.0)
Hemoglobin: 8.1 g/dL — ABNORMAL LOW (ref 12.0–15.0)
MCH: 26.8 pg (ref 26.0–34.0)
MCHC: 30.7 g/dL (ref 30.0–36.0)
MCV: 87.4 fL (ref 80.0–100.0)
Platelets: 93 10*3/uL — ABNORMAL LOW (ref 150–400)
RBC: 3.02 MIL/uL — ABNORMAL LOW (ref 3.87–5.11)
RDW: 18 % — ABNORMAL HIGH (ref 11.5–15.5)
WBC: 4.6 10*3/uL (ref 4.0–10.5)
nRBC: 0.4 % — ABNORMAL HIGH (ref 0.0–0.2)

## 2021-11-14 LAB — COMPREHENSIVE METABOLIC PANEL
ALT: 14 U/L (ref 0–44)
AST: 28 U/L (ref 15–41)
Albumin: 1.7 g/dL — ABNORMAL LOW (ref 3.5–5.0)
Alkaline Phosphatase: 107 U/L (ref 38–126)
Anion gap: 7 (ref 5–15)
BUN: 68 mg/dL — ABNORMAL HIGH (ref 8–23)
CO2: 16 mmol/L — ABNORMAL LOW (ref 22–32)
Calcium: 7.1 mg/dL — ABNORMAL LOW (ref 8.9–10.3)
Chloride: 119 mmol/L — ABNORMAL HIGH (ref 98–111)
Creatinine, Ser: 4.54 mg/dL — ABNORMAL HIGH (ref 0.44–1.00)
GFR, Estimated: 10 mL/min — ABNORMAL LOW (ref >60)
Glucose, Bld: 132 mg/dL — ABNORMAL HIGH (ref 70–99)
Potassium: 3.7 mmol/L (ref 3.5–5.1)
Sodium: 142 mmol/L (ref 135–145)
Total Bilirubin: 0.7 mg/dL (ref 0.3–1.2)
Total Protein: 4.7 g/dL — ABNORMAL LOW (ref 6.5–8.1)

## 2021-11-14 MED ORDER — SODIUM CHLORIDE 0.9 % IV SOLN
500.0000 mg | Freq: Two times a day (BID) | INTRAVENOUS | Status: DC
  Administered 2021-11-14 – 2021-11-20 (×9): 500 mg via INTRAVENOUS
  Filled 2021-11-14 (×3): qty 10
  Filled 2021-11-14: qty 500
  Filled 2021-11-14: qty 10
  Filled 2021-11-14: qty 500
  Filled 2021-11-14 (×7): qty 10

## 2021-11-14 MED ORDER — ALBUMIN HUMAN 25 % IV SOLN
12.5000 g | Freq: Once | INTRAVENOUS | Status: AC
  Administered 2021-11-14: 12.5 g via INTRAVENOUS
  Filled 2021-11-14: qty 50

## 2021-11-14 MED ORDER — FUROSEMIDE 10 MG/ML IJ SOLN
80.0000 mg | Freq: Once | INTRAMUSCULAR | Status: AC
  Administered 2021-11-14: 80 mg via INTRAVENOUS
  Filled 2021-11-14: qty 8

## 2021-11-14 NOTE — Progress Notes (Signed)
PROGRESS NOTE    Kara Mullins  QAS:[id number] DOB: [birth year] DOA: 11/08/2021 PCP: [name], NP    Brief Narrative:  71 y.o. female with medical history significant of chronic combined systolic and diastolic heart failure, generalized anxiety disorder, hyperlipidemia, hypertension, stage IIIb CKD who was brought to the emergency department due to inability to swallow less than an hour before arrival. While in ED, pt had episode of syncope, found to be hypothermic and evidence suggesting UTI. Pt was admitted for further work up. Found to have AKI and UTI as well.   Assessment & Plan:   Principal Problem:   Hypothermia Active Problems:   Essential hypertension   AKI (acute kidney injury) ([place])   Hyperlipidemia   Chronic combined systolic and diastolic congestive heart failure (HCC)   Stage 3b chronic kidney disease (CKD) (HCC)   Pancytopenia (HCC)   Severe protein-calorie malnutrition (HCC)  Principal Problem: Severe sepsis with UTI present on admission: Presented with hypothermia, pancytopenia, ARF, toxic metabolic encephalopathy Urine cx pos for enterobacter, now on meropenem. Blood cultures negative. Urine cultures with Enterobacter. Sepsis physiology improving.  Temperatures better today.   Active Problems: Dysphagia/dysarthria/acute metabolic encephalopathy. MRI of the brain was negative for any acute stroke.  This is likely due to metabolic encephalopathy.     Essential hypertension Holding diuretics given ARF Coreg now on hold, per below Cont to monitor BP and heart rate. On PRN hydralazine     AKI (acute kidney injury) ([place]) Superimposed on stage 3b chronic kidney disease (CKD) ([place]) Cont IVF hydration as tolerated.  Currently remains on dextrose normal saline 75 mL/h. Renal US reviewed.  Chronic bilateral hydronephrosis. Serial bladder scan suggested outlet obstruction, had indwelling Foley catheter. Urine output 400 mL last 24 hours. Foley catheter was  removed with ICU protocol  Nephrology following, appreciate recs. Suspected ATN but cannot rule out kidney injury associated with plasma cell dyscrasia Cr up to 4.54 today Daily monitoring.  Nephrology does not recommend hemodialysis.   Hyperlipidemia Continue rosuvastatin 10 mg p.o. daily.     Chronic combined systolic and diastolic congestive heart failure (HCC) No signs of decompensation. CXR with evidence of vascular congestion and cardiomegaly Holding furosemide, losartan and spironolactone given ARF Giving IVF given soft bp today On minimal O2     Severe protein-calorie malnutrition (HCC) Protein supplementation. Nutritional services evaluation.  Sinus Bradycardia -HR recently noted to be as low as the 30's, asymptomatic -Discussed with Cardiology who recommends holding coreg -HR has improved off coreg   DVT prophylaxis: SCD's Code Status: Full Family Communication: Pt in room, pt's daughter at bedside  Status is: Inpatient  Remains inpatient appropriate because: Severity of illness    Consultants:  Nephrology  Procedures:    Antimicrobials: Anti-infectives (From admission, onward)    Start     Dose/Rate Route Frequency Ordered Stop   11/13/21 1300  meropenem (MERREM) 1 g in sodium chloride 0.9 % 100 mL IVPB        1 g 200 mL/hr over 30 Minutes Intravenous Every 12 hours 11/13/21 1151     11/09/21 2200  ceFEPIme (MAXIPIME) 2 g in sodium chloride 0.9 % 100 mL IVPB  Status:  Discontinued        2 g 200 mL/hr over 30 Minutes Intravenous Every 24 hours 11/09/21 0323 11/13/21 1148   11/09/21 0322  vancomycin variable dose per unstable renal function (pharmacist dosing)  Status:  Discontinued         Does not apply See admin  instructions 11/09/21 0323 11/09/21 1355   11/09/21 0215  ceFEPIme (MAXIPIME) 2 g in sodium chloride 0.9 % 100 mL IVPB        2 g 200 mL/hr over 30 Minutes Intravenous  Once 11/09/21 0210 11/09/21 0302   11/09/21 0215  metroNIDAZOLE (FLAGYL)  IVPB 500 mg  Status:  Discontinued        500 mg 100 mL/hr over 60 Minutes Intravenous Every 12 hours 11/09/21 0210 11/12/21 1128   11/09/21 0215  vancomycin (VANCOCIN) IVPB 1000 mg/200 mL premix        1,000 mg 200 mL/hr over 60 Minutes Intravenous  Once 11/09/21 0210 11/09/21 0339       Subjective: Patient seen and examined.  No overnight events.  Daughter at the bedside.  Mostly sleepy.  Responds minimally.  Objective: Vitals:   11/14/21 0600 11/14/21 0700 11/14/21 0800 11/14/21 0900  BP: (!) 146/63 (!) 145/66 (!) 162/69 (!) 173/74  Pulse: 60 60 (!) 59 61  Resp:      Temp: 98.6 F (37 C) 98.6 F (37 C) 98.1 F (36.7 C)   TempSrc:   Bladder   SpO2: 100% 100% 100% 100%  Weight:      Height:        Intake/Output Summary (Last 24 hours) at 11/14/2021 1031 Last data filed at 11/14/2021 0850 Gross per 24 hour  Intake 1744.54 ml  Output 510 ml  Net 1234.54 ml   Filed Weights   11/09/21 0330 11/13/21 1240  Weight: 79.8 kg 81 kg    Examination:  General: Chronically sick looking.  Frail and debilitated.  On room air.  Not in any distress. Cardiovascular: S1-S2 normal.  Regular rate rhythm. Respiratory: Bilateral clear.  Some conducted upper airway sounds. Gastrointestinal: Soft.  Nontender.  Bowel sounds present. Ext: Chronic lymphedema bilateral lower extremities.  2+ edema on the dorsum of the feet. Neuro: Alert on stimulation but mostly sleepy.  Equal strength on all extremities but very weak.  Data Reviewed: I have personally reviewed following labs and imaging studies  CBC: Recent Labs  Lab 11/08/21 2357 11/10/21 0240 11/11/21 0243 11/12/21 0254 11/13/21 0300 11/14/21 0233  WBC 2.8* 3.1* 3.8* 3.7* 3.9* 4.6  NEUTROABS 2.2  --   --   --   --   --   HGB 9.1* 7.7* 8.4* 8.2* 8.8* 8.1*  HCT 28.1* 24.8* 25.8* 26.3* 27.3* 26.4*  MCV 83.6 87.3 83.8 86.5 84.0 87.4  PLT 56* 46* 53* 61* 86* 93*   Basic Metabolic Panel: Recent Labs  Lab 11/10/21 0240  11/11/21 0243 11/12/21 0254 11/13/21 0300 11/14/21 0233  NA 137 139 139 140 142  K 3.5 3.3* 3.8 3.6 3.7  CL 113* 113* 114* 115* 119*  CO2 18* 18* 17* 17* 16*  GLUCOSE 55* 80 57* 95 132*  BUN 68* 70* 69* 72* 68*  CREATININE 4.23* 4.33* 4.29* 4.50* 4.54*  CALCIUM 6.6* 6.7* 7.0* 7.1* 7.1*   GFR: Estimated Creatinine Clearance: 12.3 mL/min (A) (by C-G formula based on SCr of 4.54 mg/dL (H)). Liver Function Tests: Recent Labs  Lab 11/10/21 0240 11/11/21 0243 11/12/21 0254 11/13/21 0300 11/14/21 0233  AST 24 25 25 27 28   ALT 18 17 16 15 14   ALKPHOS 115 124 110 111 107  BILITOT 0.7 0.7 0.7 0.5 0.7  PROT 5.3* 5.4* 5.1* 5.5* 4.7*  ALBUMIN 2.5* 2.2* 2.0* 1.9* 1.7*   Recent Labs  Lab 11/08/21 2357  LIPASE 65*   No results for input(s): AMMONIA  in the last 168 hours. Coagulation Profile: No results for input(s): INR, PROTIME in the last 168 hours. Cardiac Enzymes: No results for input(s): CKTOTAL, CKMB, CKMBINDEX, TROPONINI in the last 168 hours. BNP (last 3 results) No results for input(s): PROBNP in the last 8760 hours. HbA1C: No results for input(s): HGBA1C in the last 72 hours. CBG: Recent Labs  Lab 11/13/21 1541 11/13/21 1933 11/13/21 2319 11/14/21 0309 11/14/21 0806  GLUCAP 144* 150* 128* 131* 113*   Lipid Profile: No results for input(s): CHOL, HDL, LDLCALC, TRIG, CHOLHDL, LDLDIRECT in the last 72 hours. Thyroid Function Tests: No results for input(s): TSH, T4TOTAL, FREET4, T3FREE, THYROIDAB in the last 72 hours.  Anemia Panel: No results for input(s): VITAMINB12, FOLATE, FERRITIN, TIBC, IRON, RETICCTPCT in the last 72 hours. Sepsis Labs: Recent Labs  Lab 11/09/21 0022 11/09/21 0159 11/09/21 0210 11/10/21 0240 11/11/21 1414 11/12/21 0254 11/13/21 0300  PROCALCITON  --   --    < > 0.59 0.29 0.27 0.24  LATICACIDVEN 0.5 0.5  --   --   --   --   --    < > = values in this interval not displayed.    Recent Results (from the past 240 hour(s))  Resp  Panel by RT-PCR (Flu A&B, Covid) Nasopharyngeal Swab     Status: None   Collection Time: 11/09/21 12:13 AM   Specimen: Nasopharyngeal Swab; Nasopharyngeal(NP) swabs in vial transport medium  Result Value Ref Range Status   SARS Coronavirus 2 by RT PCR NEGATIVE NEGATIVE Final    Comment: (NOTE) SARS-CoV-2 target nucleic acids are NOT DETECTED.  The SARS-CoV-2 RNA is generally detectable in upper respiratory specimens during the acute phase of infection. The lowest concentration of SARS-CoV-2 viral copies this assay can detect is 138 copies/mL. A negative result does not preclude SARS-Cov-2 infection and should not be used as the sole basis for treatment or other patient management decisions. A negative result may occur with  improper specimen collection/handling, submission of specimen other than nasopharyngeal swab, presence of viral mutation(s) within the areas targeted by this assay, and inadequate number of viral copies(<138 copies/mL). A negative result must be combined with clinical observations, patient history, and epidemiological information. The expected result is Negative.  Fact Sheet for Patients:  EntrepreneurPulse.com.au  Fact Sheet for Healthcare Providers:  IncredibleEmployment.be  This test is no t yet approved or cleared by the [place] FDA and  has been authorized for detection and/or diagnosis of SARS-CoV-2 by FDA under an Emergency Use Authorization (EUA). This EUA will remain  in effect (meaning this test can be used) for the duration of the COVID-19 declaration under Section 564(b)(1) of the Act, 21 U.S.C.section 360bbb-3(b)(1), unless the authorization is terminated  or revoked sooner.       Influenza A by PCR NEGATIVE NEGATIVE Final   Influenza B by PCR NEGATIVE NEGATIVE Final    Comment: (NOTE) The Xpert Xpress SARS-CoV-2/FLU/RSV plus assay is intended as an aid in the diagnosis of influenza from Nasopharyngeal  swab specimens and should not be used as a sole basis for treatment. Nasal washings and aspirates are unacceptable for Xpert Xpress SARS-CoV-2/FLU/RSV testing.  Fact Sheet for Patients: EntrepreneurPulse.com.au  Fact Sheet for Healthcare Providers: IncredibleEmployment.be  This test is not yet approved or cleared by the [place] FDA and has been authorized for detection and/or diagnosis of SARS-CoV-2 by FDA under an Emergency Use Authorization (EUA). This EUA will remain in effect (meaning this test can be used) for the duration  of the COVID-19 declaration under Section 564(b)(1) of the Act, 21 U.S.C. section 360bbb-3(b)(1), unless the authorization is terminated or revoked.  Performed at [facility], [place] [address], [place], [place] [address]   MRSA Next Gen by PCR, Nasal     Status: None   Collection Time: 11/09/21  2:29 AM   Specimen: Nasal Mucosa; Nasal Swab  Result Value Ref Range Status   MRSA by PCR Next Gen NOT DETECTED NOT DETECTED Final    Comment: (NOTE) The GeneXpert MRSA Assay (FDA approved for NASAL specimens only), is one component of a comprehensive MRSA colonization surveillance program. It is not intended to diagnose MRSA infection nor to guide or monitor treatment for MRSA infections. Test performance is not FDA approved in patients less than 84 years old. Performed at [facility], [place] [address], [place], [place] [address]   Urine Culture     Status: Abnormal   Collection Time: 11/09/21  9:44 AM   Specimen: Urine, Clean Catch  Result Value Ref Range Status   Specimen Description   Final    URINE, CLEAN CATCH Performed at [facility], [place] [address] [place], [place] [address]    Special Requests   Final    NONE Performed at [facility], [place] [address] [place], [place] [address]    Culture (A)  Final    <10,000 COLONIES/mL  INSIGNIFICANT GROWTH Performed at [place] [address] [place], [place] [address]    Report Status 11/10/2021 FINAL  Final  C Difficile Quick Screen w PCR reflex     Status: None   Collection Time: 11/10/21 11:08 AM   Specimen: STOOL  Result Value Ref Range Status   C Diff antigen NEGATIVE NEGATIVE Final   C Diff toxin NEGATIVE NEGATIVE Final   C Diff interpretation No C. difficile detected.  Final    Comment: Performed at [facility], [place] [address] [place], [place] [address]  Urine Culture     Status: Abnormal   Collection Time: 11/10/21  6:07 PM   Specimen: Urine, Catheterized  Result Value Ref Range Status   Specimen Description   Final    URINE, CATHETERIZED Performed at [place] [address] [place], [place] [address]    Special Requests   Final    NONE Performed at [facility], [place] [address], [place], [place] [address]    Culture 80,000 COLONIES/mL ENTEROBACTER CLOACAE (A)  Final   Report Status 11/13/2021 FINAL  Final   Organism ID, Bacteria ENTEROBACTER CLOACAE (A)  Final      Susceptibility   Enterobacter cloacae - MIC*    CEFAZOLIN >=64 RESISTANT Resistant     CIPROFLOXACIN <=0.25 SENSITIVE Sensitive     GENTAMICIN <=1 SENSITIVE Sensitive     IMIPENEM 0.5 SENSITIVE Sensitive     NITROFURANTOIN 64 INTERMEDIATE Intermediate     TRIMETH/SULFA <=20 SENSITIVE Sensitive     * 80,000 COLONIES/mL ENTEROBACTER CLOACAE  Culture, blood (routine x 2)     Status: None (Preliminary result)   Collection Time: 11/11/21  8:17 AM   Specimen: BLOOD  Result Value Ref Range Status   Specimen Description   Final    BLOOD BLOOD RIGHT HAND Performed at [place] [address] [place], [place] [address]    Special Requests   Final    BOTTLES DRAWN AEROBIC ONLY Blood Culture adequate volume Performed at [facility], [place] [address], [place], [place]  [address]    Culture   Final    NO GROWTH 2 DAYS Performed at [place] [address], [place], [place] [address]    Report Status PENDING  Incomplete  Culture, blood (routine x 2)     Status: None (Preliminary result)   Collection Time: 11/11/21  8:17 AM   Specimen: BLOOD  Result Value Ref Range Status   Specimen Description   Final    BLOOD BLOOD RIGHT HAND Performed at [place] [address], [place], [place] [address]    Special Requests   Final    BOTTLES DRAWN AEROBIC ONLY Blood Culture adequate volume Performed at [place] [address] [place], [place] [address]    Culture   Final    NO GROWTH 2 DAYS Performed at [place] [address], [place], [place] [address]    Report Status PENDING  Incomplete      Radiology Studies: DG CHEST PORT 1 VIEW  Result Date: 11/13/2021 CLINICAL DATA:  Sepsis. EXAM: PORTABLE CHEST 1 VIEW COMPARISON:  November 09, 2021. FINDINGS: Stable cardiomegaly with mild central pulmonary vascular congestion. Small bilateral pleural effusions are noted. Possible bibasilar pulmonary edema is noted. The visualized skeletal structures are unremarkable. IMPRESSION: Stable cardiomegaly with mild central pulmonary vascular congestion and possible bibasilar pulmonary edema and small pleural effusions. Electronically Signed   By: Marijo Conception M.D.   On: 11/13/2021 12:57    Scheduled Meds:  bacitracin-polymyxin b   Left Eye QHS   Chlorhexidine Gluconate Cloth  6 each Topical Daily   feeding supplement  237 mL Oral BID BM   mouth rinse  15 mL Mouth Rinse BID   multivitamin with minerals  1 tablet Oral Daily   rosuvastatin  10 mg Oral Daily   sodium bicarbonate  650 mg Oral TID   Continuous Infusions:  dextrose 5 % and 0.9% NaCl 75 mL/hr at 11/14/21 0245   meropenem (MERREM) IV Stopped (11/13/21 2228)     LOS: 5 days   Total time spent: 30 minutes  Barb Merino, MD [facility]   If 7PM-7AM, please contact night-coverage 11/14/2021, 10:31 AM

## 2021-11-14 NOTE — Progress Notes (Addendum)
Patient ID: Kara Mullins, female   DOB: November 23, 1950, 71 y.o.   MRN: [id number] [facility] Progress Note   Assessment/ Plan:   1. Acute kidney Injury on chronic kidney disease stage IIIb: Baseline creatinine ranging 1.3-1.6 in mid 2022 with current acute kidney injury that is suspected to be likely from ATN but cannot entirely rule out kidney injury associated with plasma cell dyscrasia.  Nonoliguric overnight with essentially unchanged renal function overnight-Foley catheter discontinued due to unit protocol and I have asked the RN to perform a bladder scan to verify that she does not have retention.  I will order albumin/Lasix to try and supplement diuresis.  Based on her declining clinical status over the past year-would not recommend chronic hemodialysis. 2.  Non-Anion gap metabolic acidosis: Secondary to acute kidney injury superimposed on chronic kidney disease and likely plasma cell dyscrasia.  Sodium bicarbonate started 650 mg 3 times a day. 3.  Pancytopenia: Likely associated with sepsis but cannot entirely rule out the impact of plasma cell dyscrasia on this process.  Status post broad-spectrum intravenous antibiotics. 4.  Hypertension: Blood pressures intermittently elevated, continue to avoid RAAS blockade in the setting of acute kidney injury. 5.  History of combined systolic/diastolic congestive heart failure: With some seemingly chronic pedal edema but without any respiratory findings-given stable renal function, will attempt diuresis.  Subjective:   With some agitation overnight and no acute clinical changes.   Objective:   BP (!) 161/71    Pulse (!) 57    Temp (!) 97.4 F (36.3 C) (Oral)    Resp 14    Ht 5\' 5"  (1.651 m)    Wt 81 kg Comment: 1 pillow, 1 sheet, 1 blanket   SpO2 100%    BMI 29.72 kg/m   Intake/Output Summary (Last 24 hours) at 11/14/2021 1217 Last data filed at 11/14/2021 0850 Gross per 24 hour  Intake 1504.93 ml  Output 510 ml  Net 994.93 ml    Weight change:   Physical Exam: Gen: Actively resting in bed, awakens easily to calling out her name with some facial expressions/animation CVS: Pulse regular rhythm, normal rate, S1 and S2 normal Resp: Poor inspiratory effort with decreased breath sounds over bases, no rales/rhonchi Abd: Soft, obese, nontender, bowel sounds normal Ext: 1+ bilateral pitting pretibial edema, 1-2+ hip edema.  Imaging: DG CHEST PORT 1 VIEW  Result Date: 11/13/2021 CLINICAL DATA:  Sepsis. EXAM: PORTABLE CHEST 1 VIEW COMPARISON:  November 09, 2021. FINDINGS: Stable cardiomegaly with mild central pulmonary vascular congestion. Small bilateral pleural effusions are noted. Possible bibasilar pulmonary edema is noted. The visualized skeletal structures are unremarkable. IMPRESSION: Stable cardiomegaly with mild central pulmonary vascular congestion and possible bibasilar pulmonary edema and small pleural effusions. Electronically Signed   By: Marijo Conception M.D.   On: 11/13/2021 12:57    Labs: BMET Recent Labs  Lab 11/08/21 2357 11/10/21 0240 11/11/21 0243 11/12/21 0254 11/13/21 0300 11/14/21 0233  NA 142 137 139 139 140 142  K 4.5 3.5 3.3* 3.8 3.6 3.7  CL 115* 113* 113* 114* 115* 119*  CO2 19* 18* 18* 17* 17* 16*  GLUCOSE 118* 55* 80 57* 95 132*  BUN 68* 68* 70* 69* 72* 68*  CREATININE 4.10* 4.23* 4.33* 4.29* 4.50* 4.54*  CALCIUM 7.0* 6.6* 6.7* 7.0* 7.1* 7.1*   CBC Recent Labs  Lab 11/08/21 2357 11/10/21 0240 11/11/21 0243 11/12/21 0254 11/13/21 0300 11/14/21 0233  WBC 2.8*   < > 3.8* 3.7* 3.9* 4.6  NEUTROABS  2.2  --   --   --   --   --   HGB 9.1*   < > 8.4* 8.2* 8.8* 8.1*  HCT 28.1*   < > 25.8* 26.3* 27.3* 26.4*  MCV 83.6   < > 83.8 86.5 84.0 87.4  PLT 56*   < > 53* 61* 86* 93*   < > = values in this interval not displayed.    Medications:     bacitracin-polymyxin b   Left Eye QHS   Chlorhexidine Gluconate Cloth  6 each Topical Daily   feeding supplement  237 mL Oral BID BM    mouth rinse  15 mL Mouth Rinse BID   multivitamin with minerals  1 tablet Oral Daily   rosuvastatin  10 mg Oral Daily   sodium bicarbonate  650 mg Oral TID   Elmarie Shiley, MD 11/14/2021, 12:17 PM

## 2021-11-14 NOTE — Progress Notes (Signed)
Pharmacy Antibiotic Note  Kara Mullins is a 71 y.o. female admitted on 11/08/2021 with inability to swallow and found to be hypothermic with possible UTI.Marland Kitchen  Pharmacy consulted on 1/31 to transition patient from cefepime to meropenem as patient remains hypotensive and hypothermic on cefepime.   Today, 11/14/2021: - day #6 abx, D2 meropenem - wbc wnl - scr trending up 4.54 (crcl~12) --> nephrology team seeing pt - 80K enterobacter cloacae in ucx  Plan: - adjust Meropenem to 500 mg IV q12h  - Will f/u renal function, culture results, and clinical course _______________________________________________  Height: 5\' 5"  (165.1 cm) Weight: 81 kg (178 lb 9.2 oz) (1 pillow, 1 sheet, 1 blanket) IBW/kg (Calculated) : 57  Temp (24hrs), Avg:97 F (36.1 C), Min:95.7 F (35.4 C), Max:98.6 F (37 C)  Recent Labs  Lab 11/09/21 0022 11/09/21 0159 11/10/21 0240 11/11/21 0243 11/12/21 0254 11/13/21 0300 11/14/21 0233  WBC  --   --  3.1* 3.8* 3.7* 3.9* 4.6  CREATININE  --   --  4.23* 4.33* 4.29* 4.50* 4.54*  LATICACIDVEN 0.5 0.5  --   --   --   --   --      Estimated Creatinine Clearance: 12.3 mL/min (A) (by C-G formula based on SCr of 4.54 mg/dL (H)).    No Known Allergies  1/27 vanc x1  1/27 cefepime >>1/31 1/27 flagyl >> 1/30 1/31 meropenem>>   1/27 Ucx: <10k colonies  1/27 MRSA PCR: negative 1/29 BCx x2:  1/28 UCx: 80 K enterobacter (R= ancef, I = nitrof) FINAL  Thank you for allowing pharmacy to be a part of this patients care.  Lynelle Doctor 11/14/2021 1:27 PM

## 2021-11-15 LAB — RENAL FUNCTION PANEL
Albumin: 1.8 g/dL — ABNORMAL LOW (ref 3.5–5.0)
Anion gap: 5 (ref 5–15)
BUN: 65 mg/dL — ABNORMAL HIGH (ref 8–23)
CO2: 17 mmol/L — ABNORMAL LOW (ref 22–32)
Calcium: 7.2 mg/dL — ABNORMAL LOW (ref 8.9–10.3)
Chloride: 121 mmol/L — ABNORMAL HIGH (ref 98–111)
Creatinine, Ser: 4.49 mg/dL — ABNORMAL HIGH (ref 0.44–1.00)
GFR, Estimated: 10 mL/min — ABNORMAL LOW (ref >60)
Glucose, Bld: 120 mg/dL — ABNORMAL HIGH (ref 70–99)
Phosphorus: 6.4 mg/dL — ABNORMAL HIGH (ref 2.5–4.6)
Potassium: 3.5 mmol/L (ref 3.5–5.1)
Sodium: 143 mmol/L (ref 135–145)

## 2021-11-15 LAB — GLUCOSE, CAPILLARY
Glucose-Capillary: 112 mg/dL — ABNORMAL HIGH (ref 70–99)
Glucose-Capillary: 104 mg/dL — ABNORMAL HIGH (ref 70–99)
Glucose-Capillary: 118 mg/dL — ABNORMAL HIGH (ref 70–99)
Glucose-Capillary: 133 mg/dL — ABNORMAL HIGH (ref 70–99)
Glucose-Capillary: 142 mg/dL — ABNORMAL HIGH (ref 70–99)
Glucose-Capillary: 125 mg/dL — ABNORMAL HIGH (ref 70–99)

## 2021-11-15 MED ORDER — LIP MEDEX EX OINT: 1.0000 "application " | TOPICAL_OINTMENT | CUTANEOUS | Status: DC | PRN

## 2021-11-15 MED ORDER — ALBUMIN HUMAN 25 % IV SOLN
12.5000 g | Freq: Once | INTRAVENOUS | Status: AC
  Administered 2021-11-15: 12.5 g via INTRAVENOUS
  Filled 2021-11-15: qty 50

## 2021-11-15 MED ORDER — FUROSEMIDE 10 MG/ML IJ SOLN
80.0000 mg | Freq: Once | INTRAMUSCULAR | Status: AC
  Administered 2021-11-15: 80 mg via INTRAVENOUS
  Filled 2021-11-15: qty 8

## 2021-11-15 NOTE — Progress Notes (Signed)
PROGRESS NOTE    Kara Mullins  FIE:[id number] DOB: 07/23/1951 DOA: 11/08/2021 PCP: [name], NP    Brief Narrative:  71 y.o. female with medical history significant of chronic combined systolic and diastolic heart failure, generalized anxiety disorder, hyperlipidemia, hypertension, stage IIIb CKD who was brought to the emergency department due to inability to swallow less than an hour before arrival. While in ED, pt had episode of syncope, found to be hypothermic and evidence suggesting UTI. Pt was admitted for further work up. Found to have AKI and UTI as well.   Assessment & Plan:   Principal Problem:   Hypothermia Active Problems:   Essential hypertension   AKI (acute kidney injury) ([place])   Hyperlipidemia   Chronic combined systolic and diastolic congestive heart failure (HCC)   Stage 3b chronic kidney disease (CKD) (HCC)   Pancytopenia (HCC)   Severe protein-calorie malnutrition (HCC)  Principal Problem: Severe sepsis with UTI present on admission: Presented with hypothermia, pancytopenia, ARF, toxic metabolic encephalopathy Urine cx pos for enterobacter, now on meropenem. Blood cultures negative. Urine cultures with Enterobacter. Sepsis physiology improving.  Temperatures better today.  We will continue 7 days of therapy.   Active Problems: Dysphagia/dysarthria/acute metabolic encephalopathy. MRI of the brain was negative for any acute stroke.  This is likely due to metabolic encephalopathy.  Speech therapy evaluation today.     Essential hypertension Holding diuretics given ARF Coreg now on hold, per below Cont to monitor BP and heart rate. On PRN hydralazine     AKI (acute kidney injury) ([place]) Superimposed on stage 3b chronic kidney disease (CKD) (HCC) Gentle IV fluid.  Currently remains on dextrose normal saline 75 mL/h. Renal US reviewed.  Chronic bilateral hydronephrosis. Patient had adequate urine output last 24 hours, recorded 2400 mL.  No evidence  of uremia.  Creatinine stabilizing.  Followed by nephrology.  Not a good candidate for dialysis.  Await renal recovery. Foley catheter is indicated because of acute renal failure and multiple episodes of urinary retention.   Hyperlipidemia Continue rosuvastatin 10 mg p.o. daily.     Chronic combined systolic and diastolic congestive heart failure (HCC) No signs of decompensation. CXR with evidence of vascular congestion and cardiomegaly Holding furosemide, losartan and spironolactone given ARF On minimal O2     Severe protein-calorie malnutrition (HCC) Protein supplementation. Nutritional services evaluation.  Sinus Bradycardia -HR recently noted to be as low as the 30's, asymptomatic -Discussed with Cardiology who recommends holding coreg -HR has improved off coreg   Physical deconditioning: Work with PT OT and speech.  Refer to SNF.  DVT prophylaxis: SCD's Code Status: Full Family Communication: Pt in room, pt's daughter at bedside  Status is: Inpatient  Remains inpatient appropriate because: Severity of illness, on IV fluids.  Awaiting renal function recovery.    Consultants:  Nephrology  Procedures:  None.  Antimicrobials: Anti-infectives (From admission, onward)    Start     Dose/Rate Route Frequency Ordered Stop   11/14/21 2200  meropenem (MERREM) 500 mg in sodium chloride 0.9 % 100 mL IVPB        500 mg 200 mL/hr over 30 Minutes Intravenous Every 12 hours 11/14/21 1332     11/13/21 1300  meropenem (MERREM) 1 g in sodium chloride 0.9 % 100 mL IVPB  Status:  Discontinued        1 g 200 mL/hr over 30 Minutes Intravenous Every 12 hours 11/13/21 1151 11/14/21 1332   11/09/21 2200  ceFEPIme (MAXIPIME) 2 g in sodium  chloride 0.9 % 100 mL IVPB  Status:  Discontinued        2 g 200 mL/hr over 30 Minutes Intravenous Every 24 hours 11/09/21 0323 11/13/21 1148   11/09/21 0322  vancomycin variable dose per unstable renal function (pharmacist dosing)  Status:  Discontinued          Does not apply See admin instructions 11/09/21 0323 11/09/21 1355   11/09/21 0215  ceFEPIme (MAXIPIME) 2 g in sodium chloride 0.9 % 100 mL IVPB        2 g 200 mL/hr over 30 Minutes Intravenous  Once 11/09/21 0210 11/09/21 0302   11/09/21 0215  metroNIDAZOLE (FLAGYL) IVPB 500 mg  Status:  Discontinued        500 mg 100 mL/hr over 60 Minutes Intravenous Every 12 hours 11/09/21 0210 11/12/21 1128   11/09/21 0215  vancomycin (VANCOCIN) IVPB 1000 mg/200 mL premix        1,000 mg 200 mL/hr over 60 Minutes Intravenous  Once 11/09/21 0210 11/09/21 0339       Subjective:  Patient seen and examined.  Today surprisingly she is more awake.  Daughter at the bedside.  Nursing reported overnight difficulty swallowing so they kept her n.p.o. Patient wakes up, she greets and "Jesus is great and he will save [place]".  Patient herself denies any complaints.  Objective: Vitals:   11/15/21 0500 11/15/21 0700 11/15/21 0800 11/15/21 0900  BP: (!) 143/66 124/64 (!) 148/66 (!) 158/102  Pulse: 70  65 62  Resp:      Temp: (!) 97.2 F (36.2 C) (!) 95.9 F (35.5 C) (!) 96.1 F (35.6 C) (!) 96.6 F (35.9 C)  TempSrc:      SpO2: 100% 100% 100% 100%  Weight:      Height:        Intake/Output Summary (Last 24 hours) at 11/15/2021 1105 Last data filed at 11/15/2021 1048 Gross per 24 hour  Intake 2316.91 ml  Output 2300 ml  Net 16.91 ml   Filed Weights   11/09/21 0330 11/13/21 1240  Weight: 79.8 kg 81 kg    Examination:  General: Chronically sick looking.  Frail and debilitated.  On room air.  Not in any distress. Cardiovascular: S1-S2 normal.  Regular rate rhythm. Respiratory: Bilateral clear.  Some conducted upper airway sounds. Gastrointestinal: Soft.  Nontender.  Bowel sounds present. Ext: Chronic lymphedema bilateral lower extremities.  2+ edema on the dorsum of the feet. Neuro: Patient is alert and awake.  Oriented x2-3.  More interactive today.  Generalized weakness but no focal  deficit.  Data Reviewed: I have personally reviewed following labs and imaging studies  CBC: Recent Labs  Lab 11/08/21 2357 11/10/21 0240 11/11/21 0243 11/12/21 0254 11/13/21 0300 11/14/21 0233  WBC 2.8* 3.1* 3.8* 3.7* 3.9* 4.6  NEUTROABS 2.2  --   --   --   --   --   HGB 9.1* 7.7* 8.4* 8.2* 8.8* 8.1*  HCT 28.1* 24.8* 25.8* 26.3* 27.3* 26.4*  MCV 83.6 87.3 83.8 86.5 84.0 87.4  PLT 56* 46* 53* 61* 86* 93*   Basic Metabolic Panel: Recent Labs  Lab 11/11/21 0243 11/12/21 0254 11/13/21 0300 11/14/21 0233 11/15/21 0250  NA 139 139 140 142 143  K 3.3* 3.8 3.6 3.7 3.5  CL 113* 114* 115* 119* 121*  CO2 18* 17* 17* 16* 17*  GLUCOSE 80 57* 95 132* 120*  BUN 70* 69* 72* 68* 65*  CREATININE 4.33* 4.29* 4.50* 4.54* 4.49*  CALCIUM  6.7* 7.0* 7.1* 7.1* 7.2*  PHOS  --   --   --   --  6.4*   GFR: Estimated Creatinine Clearance: 12.4 mL/min (A) (by C-G formula based on SCr of 4.49 mg/dL (H)). Liver Function Tests: Recent Labs  Lab 11/10/21 0240 11/11/21 0243 11/12/21 0254 11/13/21 0300 11/14/21 0233 11/15/21 0250  AST 24 25 25 27 28   --   ALT 18 17 16 15 14   --   ALKPHOS 115 124 110 111 107  --   BILITOT 0.7 0.7 0.7 0.5 0.7  --   PROT 5.3* 5.4* 5.1* 5.5* 4.7*  --   ALBUMIN 2.5* 2.2* 2.0* 1.9* 1.7* 1.8*   Recent Labs  Lab 11/08/21 2357  LIPASE 65*   No results for input(s): AMMONIA in the last 168 hours. Coagulation Profile: No results for input(s): INR, PROTIME in the last 168 hours. Cardiac Enzymes: No results for input(s): CKTOTAL, CKMB, CKMBINDEX, TROPONINI in the last 168 hours. BNP (last 3 results) No results for input(s): PROBNP in the last 8760 hours. HbA1C: No results for input(s): HGBA1C in the last 72 hours. CBG: Recent Labs  Lab 11/14/21 1534 11/14/21 2023 11/14/21 2345 11/15/21 0413 11/15/21 0804  GLUCAP 110* 113* 109* 112* 104*   Lipid Profile: No results for input(s): CHOL, HDL, LDLCALC, TRIG, CHOLHDL, LDLDIRECT in the last 72  hours. Thyroid Function Tests: No results for input(s): TSH, T4TOTAL, FREET4, T3FREE, THYROIDAB in the last 72 hours.  Anemia Panel: No results for input(s): VITAMINB12, FOLATE, FERRITIN, TIBC, IRON, RETICCTPCT in the last 72 hours. Sepsis Labs: Recent Labs  Lab 11/09/21 0022 11/09/21 0159 11/09/21 0210 11/10/21 0240 11/11/21 1414 11/12/21 0254 11/13/21 0300  PROCALCITON  --   --    < > 0.59 0.29 0.27 0.24  LATICACIDVEN 0.5 0.5  --   --   --   --   --    < > = values in this interval not displayed.    Recent Results (from the past 240 hour(s))  Resp Panel by RT-PCR (Flu A&B, Covid) Nasopharyngeal Swab     Status: None   Collection Time: 11/09/21 12:13 AM   Specimen: Nasopharyngeal Swab; Nasopharyngeal(NP) swabs in vial transport medium  Result Value Ref Range Status   SARS Coronavirus 2 by RT PCR NEGATIVE NEGATIVE Final    Comment: (NOTE) SARS-CoV-2 target nucleic acids are NOT DETECTED.  The SARS-CoV-2 RNA is generally detectable in upper respiratory specimens during the acute phase of infection. The lowest concentration of SARS-CoV-2 viral copies this assay can detect is 138 copies/mL. A negative result does not preclude SARS-Cov-2 infection and should not be used as the sole basis for treatment or other patient management decisions. A negative result may occur with  improper specimen collection/handling, submission of specimen other than nasopharyngeal swab, presence of viral mutation(s) within the areas targeted by this assay, and inadequate number of viral copies(<138 copies/mL). A negative result must be combined with clinical observations, patient history, and epidemiological information. The expected result is Negative.  Fact Sheet for Patients:  EntrepreneurPulse.com.au  Fact Sheet for Healthcare Providers:  IncredibleEmployment.be  This test is no t yet approved or cleared by the [place] FDA and  has been authorized  for detection and/or diagnosis of SARS-CoV-2 by FDA under an Emergency Use Authorization (EUA). This EUA will remain  in effect (meaning this test can be used) for the duration of the COVID-19 declaration under Section 564(b)(1) of the Act, 21 U.S.C.section 360bbb-3(b)(1), unless the authorization is  terminated  or revoked sooner.       Influenza A by PCR NEGATIVE NEGATIVE Final   Influenza B by PCR NEGATIVE NEGATIVE Final    Comment: (NOTE) The Xpert Xpress SARS-CoV-2/FLU/RSV plus assay is intended as an aid in the diagnosis of influenza from Nasopharyngeal swab specimens and should not be used as a sole basis for treatment. Nasal washings and aspirates are unacceptable for Xpert Xpress SARS-CoV-2/FLU/RSV testing.  Fact Sheet for Patients: EntrepreneurPulse.com.au  Fact Sheet for Healthcare Providers: IncredibleEmployment.be  This test is not yet approved or cleared by the [place] FDA and has been authorized for detection and/or diagnosis of SARS-CoV-2 by FDA under an Emergency Use Authorization (EUA). This EUA will remain in effect (meaning this test can be used) for the duration of the COVID-19 declaration under Section 564(b)(1) of the Act, 21 U.S.C. section 360bbb-3(b)(1), unless the authorization is terminated or revoked.  Performed at [facility] [place] [address], [place], [place] [address]   MRSA Next Gen by PCR, Nasal     Status: None   Collection Time: 11/09/21  2:29 AM   Specimen: Nasal Mucosa; Nasal Swab  Result Value Ref Range Status   MRSA by PCR Next Gen NOT DETECTED NOT DETECTED Final    Comment: (NOTE) The GeneXpert MRSA Assay (FDA approved for NASAL specimens only), is one component of a comprehensive MRSA colonization surveillance program. It is not intended to diagnose MRSA infection nor to guide or monitor treatment for MRSA infections. Test performance is not FDA approved in patients less  than 92 years old. Performed at [facility], [place] [address], [place], [place] [address]   Urine Culture     Status: Abnormal   Collection Time: 11/09/21  9:44 AM   Specimen: Urine, Clean Catch  Result Value Ref Range Status   Specimen Description   Final    URINE, CLEAN CATCH Performed at [facility], [place] [address] [place], [place] [address]    Special Requests   Final    NONE Performed at [facility], [place] [address] [place], [place] [address]    Culture (A)  Final    <10,000 COLONIES/mL INSIGNIFICANT GROWTH Performed at [place] [address] [place], [place] [address]    Report Status 11/10/2021 FINAL  Final  C Difficile Quick Screen w PCR reflex     Status: None   Collection Time: 11/10/21 11:08 AM   Specimen: STOOL  Result Value Ref Range Status   C Diff antigen NEGATIVE NEGATIVE Final   C Diff toxin NEGATIVE NEGATIVE Final   C Diff interpretation No C. difficile detected.  Final    Comment: Performed at [facility], [place] [address] [place], [place] [address]  Urine Culture     Status: Abnormal   Collection Time: 11/10/21  6:07 PM   Specimen: Urine, Catheterized  Result Value Ref Range Status   Specimen Description   Final    URINE, CATHETERIZED Performed at [place] [address] [place], [place] [address]    Special Requests   Final    NONE Performed at [facility], [place] [address], [place],  [address]    Culture 80,000 COLONIES/mL ENTEROBACTER CLOACAE (A)  Final   Report Status 11/13/2021 FINAL  Final   Organism ID, Bacteria ENTEROBACTER CLOACAE (A)  Final      Susceptibility   Enterobacter cloacae - MIC*    CEFAZOLIN >=  64 RESISTANT Resistant     CIPROFLOXACIN <=0.25 SENSITIVE Sensitive     GENTAMICIN <=1 SENSITIVE Sensitive     IMIPENEM 0.5 SENSITIVE Sensitive     NITROFURANTOIN 64 INTERMEDIATE Intermediate      TRIMETH/SULFA <=20 SENSITIVE Sensitive     * 80,000 COLONIES/mL ENTEROBACTER CLOACAE  Culture, blood (routine x 2)     Status: None (Preliminary result)   Collection Time: 11/11/21  8:17 AM   Specimen: BLOOD  Result Value Ref Range Status   Specimen Description   Final    BLOOD BLOOD RIGHT HAND Performed at [place] [address], [place], [place] [address]    Special Requests   Final    BOTTLES DRAWN AEROBIC ONLY Blood Culture adequate volume Performed at [place] [address], [place], [place] [address]    Culture   Final    NO GROWTH 4 DAYS Performed at [facility], [place] [address] [place], [place] [address]    Report Status PENDING  Incomplete  Culture, blood (routine x 2)     Status: None (Preliminary result)   Collection Time: 11/11/21  8:17 AM   Specimen: BLOOD  Result Value Ref Range Status   Specimen Description   Final    BLOOD BLOOD RIGHT HAND Performed at [place] [address], [place], [place] [address]    Special Requests   Final    BOTTLES DRAWN AEROBIC ONLY Blood Culture adequate volume Performed at [place] [address] [place], [place] [address]    Culture   Final    NO GROWTH 4 DAYS Performed at [facility], [place] [address] [place], [place] [address]    Report Status PENDING  Incomplete      Radiology Studies: DG CHEST PORT 1 VIEW  Result Date: 11/13/2021 CLINICAL DATA:  Sepsis. EXAM: PORTABLE CHEST 1 VIEW COMPARISON:  November 09, 2021. FINDINGS: Stable cardiomegaly with mild central pulmonary vascular congestion. Small bilateral pleural effusions are noted. Possible bibasilar pulmonary edema is noted. The visualized skeletal structures are unremarkable. IMPRESSION: Stable cardiomegaly with mild central pulmonary vascular congestion and possible bibasilar pulmonary edema and small pleural effusions. Electronically Signed   By: Marijo Conception M.D.   On: 11/13/2021 12:57    Scheduled Meds:  bacitracin-polymyxin b   Left Eye QHS   Chlorhexidine Gluconate Cloth  6 each Topical Daily   feeding supplement  237 mL Oral BID BM   mouth rinse  15 mL Mouth Rinse BID   multivitamin with minerals  1 tablet Oral Daily   rosuvastatin  10 mg Oral Daily   sodium bicarbonate  650 mg Oral TID   Continuous Infusions:  dextrose 5 % and 0.9% NaCl 75 mL/hr at 11/15/21 1048   meropenem (MERREM) IV Stopped (11/15/21 1002)     LOS: 6 days   Total time spent: 30 minutes  Barb Merino, MD [facility]   If 7PM-7AM, please contact night-coverage 11/15/2021, 11:05 AM

## 2021-11-15 NOTE — Progress Notes (Signed)
Patient ID: Kara Mullins, female   DOB: 1951/09/17, 71 y.o.   MRN: [id number] [facility] Progress Note   Assessment/ Plan:   1. Acute kidney Injury on chronic kidney disease stage IIIb: Baseline creatinine ranging 1.3-1.6 in mid 2022 with current acute kidney injury that is suspected to be likely from ATN but cannot entirely rule out kidney injury associated with plasma cell dyscrasia.  Excellent response to diuresis overnight-we will repeat this today and decrease rate of maintenance IV fluids.  Creatinine/BUN slightly improved compared to yesterday and she does not have any acute dialysis needs.  Encephalopathy significantly better/improved today. 2.  Non-Anion gap metabolic acidosis: Secondary to acute kidney injury superimposed on chronic kidney disease and likely plasma cell dyscrasia.  Sodium bicarbonate started 650 mg 3 times a day. 3.  Pancytopenia: Likely associated with sepsis but cannot entirely rule out the impact of plasma cell dyscrasia on this process.  Status post broad-spectrum intravenous antibiotics. 4.  Hypertension: Blood pressures intermittently elevated, continue to avoid RAAS blockade in the setting of acute kidney injury. 5.  History of combined systolic/diastolic congestive heart failure: With some seemingly chronic pedal edema but without any respiratory findings-given stable renal function, will attempt diuresis.  Subjective:   She is more awake and alert this morning and states "I woke up".  Denies any focal complaints/symptoms of discomfort.   Objective:   BP (!) 158/102    Pulse 62    Temp (!) 96.6 F (35.9 C)    Resp 15    Ht 5\' 5"  (1.651 m)    Wt 81 kg Comment: 1 pillow, 1 sheet, 1 blanket   SpO2 100%    BMI 29.72 kg/m   Intake/Output Summary (Last 24 hours) at 11/15/2021 1103 Last data filed at 11/15/2021 1048 Gross per 24 hour  Intake 2316.91 ml  Output 2300 ml  Net 16.91 ml   Weight change:   Physical Exam: Gen: Awake, alert, interactive  with me and family members at bedside CVS: Pulse regular rhythm, normal rate, S1 and S2 normal Resp: Poor inspiratory effort with decreased breath sounds over bases, no rales/rhonchi Abd: Soft, obese, nontender, bowel sounds normal Ext: 1+ bilateral pitting pretibial edema, 1-2+ hip edema.  Imaging: DG CHEST PORT 1 VIEW  Result Date: 11/13/2021 CLINICAL DATA:  Sepsis. EXAM: PORTABLE CHEST 1 VIEW COMPARISON:  November 09, 2021. FINDINGS: Stable cardiomegaly with mild central pulmonary vascular congestion. Small bilateral pleural effusions are noted. Possible bibasilar pulmonary edema is noted. The visualized skeletal structures are unremarkable. IMPRESSION: Stable cardiomegaly with mild central pulmonary vascular congestion and possible bibasilar pulmonary edema and small pleural effusions. Electronically Signed   By: Marijo Conception M.D.   On: 11/13/2021 12:57    Labs: BMET Recent Labs  Lab 11/08/21 2357 11/10/21 0240 11/11/21 0243 11/12/21 0254 11/13/21 0300 11/14/21 0233 11/15/21 0250  NA 142 137 139 139 140 142 143  K 4.5 3.5 3.3* 3.8 3.6 3.7 3.5  CL 115* 113* 113* 114* 115* 119* 121*  CO2 19* 18* 18* 17* 17* 16* 17*  GLUCOSE 118* 55* 80 57* 95 132* 120*  BUN 68* 68* 70* 69* 72* 68* 65*  CREATININE 4.10* 4.23* 4.33* 4.29* 4.50* 4.54* 4.49*  CALCIUM 7.0* 6.6* 6.7* 7.0* 7.1* 7.1* 7.2*  PHOS  --   --   --   --   --   --  6.4*   CBC Recent Labs  Lab 11/08/21 2357 11/10/21 0240 11/11/21 0243 11/12/21 0254 11/13/21 0300 11/14/21  0233  WBC 2.8*   < > 3.8* 3.7* 3.9* 4.6  NEUTROABS 2.2  --   --   --   --   --   HGB 9.1*   < > 8.4* 8.2* 8.8* 8.1*  HCT 28.1*   < > 25.8* 26.3* 27.3* 26.4*  MCV 83.6   < > 83.8 86.5 84.0 87.4  PLT 56*   < > 53* 61* 86* 93*   < > = values in this interval not displayed.    Medications:     bacitracin-polymyxin b   Left Eye QHS   Chlorhexidine Gluconate Cloth  6 each Topical Daily   feeding supplement  237 mL Oral BID BM   mouth rinse  15 mL  Mouth Rinse BID   multivitamin with minerals  1 tablet Oral Daily   rosuvastatin  10 mg Oral Daily   sodium bicarbonate  650 mg Oral TID   Elmarie Shiley, MD 11/15/2021, 11:03 AM

## 2021-11-15 NOTE — Progress Notes (Signed)
Occupational Therapy Treatment Patient Details Name: Kara Mullins MRN: [id number] DOB: 1951-03-30 Today's Date: 11/15/2021   History of present illness 71 y.o. female with medical history significant of chronic combined systolic and diastolic heart failure, generalized anxiety disorder, hyperlipidemia, hypertension, stage IIIb CKD who was brought to the emergency department due to inability to swallow less than an hour before arrival. While in ED, pt had episode of syncope, found to be hypothermic and evidence suggesting UTI. Dx of sepsis.   OT comments  Patient is alert and oriented x3 however easily distracted and taking increased time to answer OT questions. Patient also does not inform OT when feeling dizzy unless asked. BP taking sitting at edge of bed after taking few steps with BP reading 93//53. Once returned to bed BP 121/69, RN notified. Patient needing two attempts with standing due to LE buckling and mod A +2. Patient with global weakness and jerky, tremulous extremities needing 2 assist for safety. Updated DC recommendations to SNF as patient needing extensive assistance for self care and transfers.    Recommendations for follow up therapy are one component of a multi-disciplinary discharge planning process, led by the attending physician.  Recommendations may be updated based on patient status, additional functional criteria and insurance authorization.    Follow Up Recommendations  Home health OT    Assistance Recommended at Discharge Frequent or constant Supervision/Assistance  Patient can return home with the following  Two people to help with walking and/or transfers;A lot of help with bathing/dressing/bathroom;Assistance with cooking/housework;Direct supervision/assist for medications management;Direct supervision/assist for financial management;Assist for transportation;Help with stairs or ramp for entrance         Precautions / Restrictions Precautions Precautions:  Fall Precaution Comments: pt/daughter report one loss of balance ~3 weeks ago, pt slid against a wall but didn't fall to floor. orthostatic standing on 2/2 Restrictions Weight Bearing Restrictions: No       Mobility Bed Mobility Overal bed mobility: Needs Assistance Bed Mobility: Supine to Sit, Sit to Supine     Supine to sit: Mod assist, +2 for physical assistance, +2 for safety/equipment Sit to supine: Max assist, +2 for physical assistance, +2 for safety/equipment   General bed mobility comments: Patient does assist with bringing legs towards edge of bed needing increased assist to upright trunk. When getting back to bed patient needs assist to lift legs and guide trunk to supine    Transfers Overall transfer level: Needs assistance Equipment used: Rolling walker (2 wheels) Transfers: Sit to/from Stand Sit to Stand: Mod assist, +2 physical assistance, +2 safety/equipment                 Balance Overall balance assessment: Needs assistance Sitting-balance support: Feet supported Sitting balance-Leahy Scale: Fair     Standing balance support: Bilateral upper extremity supported Standing balance-Leahy Scale: Poor                             ADL either performed or assessed with clinical judgement   ADL Overall ADL's : Needs assistance/impaired                         Toilet Transfer: Moderate assistance;+2 for physical assistance;+2 for safety/equipment;Cueing for safety;Cueing for sequencing;Rolling walker (2 wheels) Toilet Transfer Details (indicate cue type and reason): Patient needing mod A +2 to power up to standing with leg buckling therefore sat back onto edge of bed. Second attempt patient able  to sustain static standing balance with mod +2. With max multimodal cues patient able to take few side steps to head of bed.                  Cognition Arousal/Alertness: Awake/alert Behavior During Therapy: Flat affect Overall Cognitive  Status: Impaired/Different from baseline Area of Impairment: Attention                   Current Attention Level: Focused           General Comments: Patient is alert and oriented x3 however needing increased time to answer questions and impaired attention often looking away from therapist. Also does not inform therapist of when feeling dizzy poor safety insight.                   Pertinent Vitals/ Pain       Pain Assessment Pain Assessment: No/denies pain         Frequency  Min 2X/week        Progress Toward Goals  OT Goals(current goals can now be found in the care plan section)  Progress towards OT goals: Progressing toward goals  Acute Rehab OT Goals Patient Stated Goal: Move OT Goal Formulation: With patient/family Time For Goal Achievement: 11/25/21 Potential to Achieve Goals: Good  Plan Discharge plan needs to be updated       AM-PAC OT "6 Clicks" Daily Activity     Outcome Measure   Help from another person eating meals?: A Little Help from another person taking care of personal grooming?: A Little Help from another person toileting, which includes using toliet, bedpan, or urinal?: Total Help from another person bathing (including washing, rinsing, drying)?: A Lot Help from another person to put on and taking off regular upper body clothing?: A Little Help from another person to put on and taking off regular lower body clothing?: Total 6 Click Score: 13    End of Session Equipment Utilized During Treatment: Gait belt;Rolling walker (2 wheels)  OT Visit Diagnosis: Unsteadiness on feet (R26.81);Muscle weakness (generalized) (M62.81)   Activity Tolerance Patient tolerated treatment well   Patient Left in bed;with call bell/phone within reach;with bed alarm set;with family/visitor present   Nurse Communication Mobility status;Other (comment) (BP)        Time: 1443-1540 OT Time Calculation (min): 32 min  Charges: OT General  Charges $OT Visit: 1 Visit OT Treatments $Self Care/Home Management : 23-37 mins  Delbert Phenix OT OT pager: [place] 11/15/2021, 1:49 PM

## 2021-11-15 NOTE — Evaluation (Signed)
Clinical/Bedside Swallow Evaluation Patient Details  Name: Kara Mullins MRN: [id number] Date of Birth: 1951-05-27  Today's Date: 11/15/2021 Time: SLP Start Time (ACUTE ONLY): 7622 SLP Stop Time (ACUTE ONLY): 1327 SLP Time Calculation (min) (ACUTE ONLY): 42 min  Past Medical History:  Past Medical History:  Diagnosis Date   Chronic combined systolic and diastolic congestive heart failure ([place]) 11/09/2021   Generalized anxiety disorder    Hyperlipidemia 11/09/2021   Hypertension    Stage 3b chronic kidney disease (CKD) ([place]) 11/09/2021   Past Surgical History:  Past Surgical History:  Procedure Laterality Date   CATARACT EXTRACTION, BILATERAL     combined systolic and diastolic congestive heart failure      HPI:  pt is a 71 yo female adm to [facility] with CHF, UTI, CKD - and found to have hypothermia.  Pt with PMH + for COP, anxiety, HLD, HTN.  Pt CXR 11/14/2021 showed possible bibasilar pulmonary edema and small pleural effusios.   Per notes, pt was unable to swallow for one hour prior to admission.  RN reports pt coughing with thin liquids and SLP swallow eval ordered.    Assessment / Plan / Recommendation  Clinical Impression  Pt clinically presents with signs of moderate oral and minimal pharyngeal dysphagia mostly c/b prolonged oral transiting/oral holding. She requires up to 30 seconds to swallow small bolus of mashed potatoes, 7-10 seconds for similar size bite of applesauce - suspect due to potential cognitive deficits.  She does benefit from verbal cues to swallow and cues to expectorate solid retention *chunks of mashed potatoes*. Anterior spillage of liquids noted on the left and lingual thrusting observed inconsistently. Pt did demonstrate overt coughing with thin water via straw - suspected due to premature spillage of liquid into airway.   Cough with thin was moderately extensive and pt required extra time to recover *approx 20 seconds. No s/s of aspiration with nectar nor tsps of  thin.  Recommend modify diet to dys1/nectar and allow tsp of thin liquids. Anticipate pt will be appropriate for diet advancement clinically.  Will follow up for dysphagia management. SLP Visit Diagnosis: Dysphagia, oral phase (R13.11);Dysphagia, unspecified (R13.10)    Aspiration Risk  Moderate aspiration risk    Diet Recommendation Dysphagia 1 (Puree);Nectar-thick liquid   Liquid Administration via: Cup;Straw (tsps thin ok) Supervision: Staff to assist with self feeding Compensations: Slow rate;Small sips/bites (tell pt to swallow as needed) Postural Changes: Remain upright for at least 30 minutes after po intake;Seated upright at 90 degrees    Other  Recommendations Oral Care Recommendations:  (mouth care after meals)    Recommendations for follow up therapy are one component of a multi-disciplinary discharge planning process, led by the attending physician.  Recommendations may be updated based on patient status, additional functional criteria and insurance authorization.  Follow up Recommendations Other (comment) (TBD)      Assistance Recommended at Discharge    Functional Status Assessment Patient has had a recent decline in their functional status and demonstrates the ability to make significant improvements in function in a reasonable and predictable amount of time.  Frequency and Duration min 1 x/week  1 week       Prognosis Prognosis for Safe Diet Advancement: Good      Swallow Study   General Date of Onset: 11/15/21 HPI: pt is a 71 yo female adm to [facility] with CHF, UTI, CKD - and found to have hypothermia.  Pt with PMH + for COP, anxiety, HLD, HTN.  Pt CXR  11/14/2021 showed possible bibasilar pulmonary edema and small pleural effusios.   Per notes, pt was unable to swallow for one hour prior to admission.  RN reports pt coughing with thin liquids and SLP swallow eval ordered. Type of Study: Bedside Swallow Evaluation Diet Prior to this Study: Regular;Thin  liquids Temperature Spikes Noted: No Respiratory Status: Nasal cannula History of Recent Intubation: No Behavior/Cognition: Alert;Cooperative;Requires cueing Oral Cavity Assessment: Within Functional Limits Oral Care Completed by SLP: No Oral Cavity - Dentition: Edentulous (dentures at home but pt does not use them to eat per RN conversation with daughter) Vision: Impaired for self-feeding Self-Feeding Abilities: Needs assist Patient Positioning: Upright in bed Baseline Vocal Quality: Low vocal intensity Volitional Cough: Weak Volitional Swallow: Able to elicit    Oral/Motor/Sensory Function Overall Oral Motor/Sensory Function: Within functional limits   Ice Chips Ice chips: Within functional limits Presentation: Spoon   Thin Liquid Thin Liquid: Impaired Presentation: Cup;Spoon;Straw Pharyngeal  Phase Impairments: Cough - Immediate    Nectar Thick Nectar Thick Liquid: Within functional limits Presentation: Cup;Straw   Honey Thick Honey Thick Liquid: Not tested   Puree Puree: Impaired Oral Phase Functional Implications: Prolonged oral transit;Oral holding Pharyngeal Phase Impairments: Suspected delayed Swallow   Solid     Solid: Impaired Presentation: Spoon Oral Phase Impairments: Reduced lingual movement/coordination;Impaired mastication Oral Phase Functional Implications: Prolonged oral transit;Impaired mastication;Oral residue Other Comments: pt required cues to expectorate potato chunks from mashed potatoes and single bite of peach      Macario Golds 11/15/2021,2:07 PM  Kathleen Lime, MS [facility] SLP [place] Office [phone] Cell [phone]

## 2021-11-16 LAB — BASIC METABOLIC PANEL
Anion gap: 7 (ref 5–15)
BUN: 63 mg/dL — ABNORMAL HIGH (ref 8–23)
CO2: 17 mmol/L — ABNORMAL LOW (ref 22–32)
Calcium: 7.4 mg/dL — ABNORMAL LOW (ref 8.9–10.3)
Chloride: 119 mmol/L — ABNORMAL HIGH (ref 98–111)
Creatinine, Ser: 4.53 mg/dL — ABNORMAL HIGH (ref 0.44–1.00)
GFR, Estimated: 10 mL/min — ABNORMAL LOW (ref >60)
Glucose, Bld: 126 mg/dL — ABNORMAL HIGH (ref 70–99)
Potassium: 3.5 mmol/L (ref 3.5–5.1)
Sodium: 143 mmol/L (ref 135–145)

## 2021-11-16 LAB — GLUCOSE, CAPILLARY
Glucose-Capillary: 117 mg/dL — ABNORMAL HIGH (ref 70–99)
Glucose-Capillary: 138 mg/dL — ABNORMAL HIGH (ref 70–99)
Glucose-Capillary: 144 mg/dL — ABNORMAL HIGH (ref 70–99)
Glucose-Capillary: 145 mg/dL — ABNORMAL HIGH (ref 70–99)
Glucose-Capillary: 156 mg/dL — ABNORMAL HIGH (ref 70–99)
Glucose-Capillary: 95 mg/dL (ref 70–99)

## 2021-11-16 LAB — CULTURE, BLOOD (ROUTINE X 2)
Culture: NO GROWTH
Culture: NO GROWTH
Special Requests: ADEQUATE
Special Requests: ADEQUATE

## 2021-11-16 NOTE — Progress Notes (Signed)
Physical Therapy Treatment Patient Details Name: Kara Mullins MRN: [id number] DOB: [birth year] Today's Date: 11/16/2021   History of Present Illness 71 y.o. female with medical history significant of chronic combined systolic and diastolic heart failure, generalized anxiety disorder, hyperlipidemia, hypertension, stage IIIb CKD who was brought to the emergency department due to inability to swallow less than an hour before arrival. While in ED, pt had episode of syncope, found to be hypothermic and evidence suggesting UTI. Dx of sepsis.    PT Comments    Pt is requiring decreased assistance for mobility. Min assist for supine to sit and to take a few pivotal steps from bed to bedside commode, then to recliner with RW. Pt performed BUE/LE strengthening exercises.    Recommendations for follow up therapy are one component of a multi-disciplinary discharge planning process, led by the attending physician.  Recommendations may be updated based on patient status, additional functional criteria and insurance authorization.  Follow Up Recommendations  Skilled nursing-short term rehab (<3 hours/day)     Assistance Recommended at Discharge Intermittent Supervision/Assistance  Patient can return home with the following A little help with walking and/or transfers;Assistance with cooking/housework;A little help with bathing/dressing/bathroom;Assist for transportation;Direct supervision/assist for medications management;Direct supervision/assist for financial management;Help with stairs or ramp for entrance   Equipment Recommendations  None recommended by PT    Recommendations for Other Services       Precautions / Restrictions Precautions Precautions: Fall Precaution Comments: pt/daughter report one loss of balance ~3 weeks ago, pt slid against a wall but didn't fall to floor. orthostatic standing on 2/2 Restrictions Weight Bearing Restrictions: No     Mobility  Bed Mobility Overal bed  mobility: Needs Assistance Bed Mobility: Supine to Sit     Supine to sit: Min assist     General bed mobility comments: min A to raise trunk    Transfers Overall transfer level: Needs assistance Equipment used: Rolling walker (2 wheels) Transfers: Sit to/from Stand, Bed to chair/wheelchair/BSC Sit to Stand: Min assist   Step pivot transfers: Min assist       General transfer comment: stand pivot from bed to bedside recliner, then again to recliner with RW; increased time and VCs for positioning/sequencing with RW; VCs hand placement    Ambulation/Gait Ambulation/Gait assistance: Min assist Gait Distance (Feet): 3 Feet Assistive device: Rolling walker (2 wheels) Gait Pattern/deviations: Step-to pattern, Decreased step length - right, Decreased step length - left Gait velocity: decr     General Gait Details: 3' x 2 with RW, VCs positioning with RW   Stairs             Wheelchair Mobility    Modified Rankin (Stroke Patients Only)       Balance Overall balance assessment: Needs assistance Sitting-balance support: Feet supported Sitting balance-Leahy Scale: Fair     Standing balance support: Bilateral upper extremity supported Standing balance-Leahy Scale: Poor Standing balance comment: reliant on walker                            Cognition Arousal/Alertness: Awake/alert Behavior During Therapy: WFL for tasks assessed/performed Overall Cognitive Status: Impaired/Different from baseline Area of Impairment: Attention                               General Comments: requires VCs to maintain focus on task        Exercises General Exercises -  Upper Extremity Shoulder Flexion: AROM, Both, 20 reps General Exercises - Lower Extremity Ankle Circles/Pumps: AROM, Both, 15 reps, Seated Long Arc Quad: AROM, 20 reps, Seated, Both    General Comments        Pertinent Vitals/Pain Pain Assessment Pain Score: 0-No pain    Home  Living                          Prior Function            PT Goals (current goals can now be found in the care plan section) Acute Rehab PT Goals Patient Stated Goal: be able to walk at home PT Goal Formulation: With patient/family Time For Goal Achievement: 11/25/21 Potential to Achieve Goals: Good Progress towards PT goals: Progressing toward goals    Frequency    Min 2X/week      PT Plan Current plan remains appropriate    Co-evaluation              AM-PAC PT "6 Clicks" Mobility   Outcome Measure  Help needed turning from your back to your side while in a flat bed without using bedrails?: A Little Help needed moving from lying on your back to sitting on the side of a flat bed without using bedrails?: A Lot Help needed moving to and from a bed to a chair (including a wheelchair)?: A Little Help needed standing up from a chair using your arms (e.g., wheelchair or bedside chair)?: A Little Help needed to walk in hospital room?: A Lot Help needed climbing 3-5 steps with a railing? : Total 6 Click Score: 14    End of Session Equipment Utilized During Treatment: Gait belt Activity Tolerance: Patient tolerated treatment well Patient left: in chair;with call bell/phone within reach;with family/visitor present;with chair alarm set Nurse Communication: Mobility status PT Visit Diagnosis: Difficulty in walking, not elsewhere classified (R26.2)     Time: 1100-1130 PT Time Calculation (min) (ACUTE ONLY): 30 min  Charges:  $Therapeutic Exercise: 8-22 mins $Therapeutic Activity: 8-22 mins                     Philomena Doheny PT 11/16/2021  Acute Rehabilitation Services Pager [phone] Office [phone]

## 2021-11-16 NOTE — Progress Notes (Signed)
Speech Language Pathology Treatment: Dysphagia  Patient Details Name: Kara Mullins MRN: [id number] DOB: 11-May-1951 Today's Date: 11/16/2021 Time: 6010-9323 SLP Time Calculation (min) (ACUTE ONLY): 13 min  Assessment / Plan / Recommendation Clinical Impression  Daughter, [name] - SLP called daughter to obtain premorbid swallow function information.  Daughter reports pt consumed very soft foods at home and recently threw away [place] stating it was "too hard" for her to eat.  She also reports pt does not use dentures at home for eating. She also confirms pt has occasional "choking" with liquids.  Today pt observed consuming nectar thick cranberry juice, applesauce, peaches and graham crackers. She has difficulty feeding herself and requires assistance.  Pt today again demonstrates impaired sustained attention - resulting in prolonged oral holding with pt staring off and orally holding boluses for up to 6 minutes despite cues to swallow.  Compensation of providing puree to aid oral transiting effective, however she is a laborious feeder which will increase her aspiration risk. At this time, recommend continue dys1/nectar - tsps thin and allow family to bring soft whole foods that they can carefully feed pt.  Given level of premorbid deficits and curren illness with full code status- caution advised with diet.  Will follow up [facility] 11/19/2021 for rediness for dietary advancement.  Anticipate pt will be appropriate to return to "soft/thin" diet during hospital coarse.  Daughter agreeable to plan per phone conversation. Posted updated sign re: family providing soft foods and family instructed compensations.    HPI HPI: pt is a 71 yo female adm to [facility] with CHF, UTI, CKD - and found to have hypothermia.  Pt with PMH + for COP, anxiety, HLD, HTN.  Pt CXR 11/14/2021 showed possible bibasilar pulmonary edema and small pleural effusios.   Per notes, pt was unable to swallow for one hour prior to admission.  RN  reports pt coughing with thin liquids and SLP swallow eval ordered.  Follow up for dysphagia management indicated due to concern for pt's dysphagia.      SLP Plan         Recommendations for follow up therapy are one component of a multi-disciplinary discharge planning process, led by the attending physician.  Recommendations may be updated based on patient status, additional functional criteria and insurance authorization.    Recommendations  Diet recommendations: Dysphagia 1 (puree);Nectar-thick liquid;Thin liquid (family approved to bring soft/thin foods) Liquids provided via: Cup;Straw;Teaspoon Medication Administration: Whole meds with puree Compensations: Slow rate;Small sips/bites (inform pt to swallow prn)                Oral Care Recommendations: Oral care BID Follow Up Recommendations: Other (comment) SLP Visit Diagnosis: Dysphagia, oral phase (R13.11);Dysphagia, unspecified (R13.10)           Vertell Limber, [place] SLP [place] Office [phone] Cell [phone]    11/16/2021, 6:22 PM

## 2021-11-16 NOTE — Progress Notes (Signed)
PROGRESS NOTE    Kara Mullins  GMW:[id number] DOB: August 24, 1951 DOA: 11/08/2021 PCP: [name], NP    Brief Narrative:  71 y.o. female with medical history significant of chronic combined systolic and diastolic heart failure, generalized anxiety disorder, hyperlipidemia, hypertension, stage IIIb CKD who was brought to the emergency department due to inability to swallow less than an hour before arrival. While in ED, pt had episode of syncope, found to be hypothermic and evidence suggesting UTI. Pt was admitted for further work up. Found to have AKI and UTI as well.   Assessment & Plan:   Principal Problem:   Hypothermia Active Problems:   Essential hypertension   AKI (acute kidney injury) ([place])   Hyperlipidemia   Chronic combined systolic and diastolic congestive heart failure (HCC)   Stage 3b chronic kidney disease (CKD) (HCC)   Pancytopenia (HCC)   Severe protein-calorie malnutrition (HCC)  Principal Problem: Severe sepsis with UTI present on admission: Presented with hypothermia, pancytopenia, ARF, toxic metabolic encephalopathy Urine cx pos for enterobacter, now on meropenem. Blood cultures negative. Urine cultures with Enterobacter. Sepsis physiology improving.  Temperatures better today.  We will continue meropenem while in the hospital or until patient has Foley catheter.   Active Problems: Dysphagia/dysarthria/acute metabolic encephalopathy. MRI of the brain was negative for any acute stroke.  This is likely due to metabolic encephalopathy.  Improving.     Essential hypertension Holding diuretics given ARF Coreg now on hold, per below Cont to monitor BP and heart rate. On PRN hydralazine     AKI (acute kidney injury) ([place]) Superimposed on stage 3b chronic kidney disease (CKD) (HCC) Gentle IV fluid.  Currently remains on dextrose normal saline 50 mL/h. Renal US reviewed.  Chronic bilateral hydronephrosis. Patient had adequate urine output last 24 hours,  recorded 700 mL.  No evidence of uremia.  Creatinine stabilizing.  Followed by nephrology.  Not a good candidate for dialysis.  Await renal recovery. Foley catheter is indicated because of acute renal failure and multiple episodes of urinary retention.  Continue until stabilization or patient able to get out of the bed to the commode.   Hyperlipidemia Continue rosuvastatin 10 mg p.o. daily.     Chronic combined systolic and diastolic congestive heart failure (HCC) No signs of decompensation. CXR with evidence of vascular congestion and cardiomegaly Holding furosemide, losartan and spironolactone given ARF On minimal O2     Severe protein-calorie malnutrition (HCC) Protein supplementation. Nutritional services evaluation.  Sinus Bradycardia -HR recently noted to be as low as the 30's, asymptomatic -Discussed with Cardiology who recommends holding coreg -HR has improved off coreg   Physical deconditioning: Work with PT OT and speech.  Refer to SNF. Transfer to SNF when cleared by nephrology.  Anticipate another 24 to 48 hours.  DVT prophylaxis: SCD's Code Status: Full Family Communication: None today.  Status is: Inpatient  Remains inpatient appropriate because: Severity of illness, on IV fluids.  Awaiting renal function recovery.    Consultants:  Nephrology  Procedures:  None.  Antimicrobials: Anti-infectives (From admission, onward)    Start     Dose/Rate Route Frequency Ordered Stop   11/14/21 2200  meropenem (MERREM) 500 mg in sodium chloride 0.9 % 100 mL IVPB        500 mg 200 mL/hr over 30 Minutes Intravenous Every 12 hours 11/14/21 1332     11/13/21 1300  meropenem (MERREM) 1 g in sodium chloride 0.9 % 100 mL IVPB  Status:  Discontinued  1 g 200 mL/hr over 30 Minutes Intravenous Every 12 hours 11/13/21 1151 11/14/21 1332   11/09/21 2200  ceFEPIme (MAXIPIME) 2 g in sodium chloride 0.9 % 100 mL IVPB  Status:  Discontinued        2 g 200 mL/hr over 30  Minutes Intravenous Every 24 hours 11/09/21 0323 11/13/21 1148   11/09/21 0322  vancomycin variable dose per unstable renal function (pharmacist dosing)  Status:  Discontinued         Does not apply See admin instructions 11/09/21 0323 11/09/21 1355   11/09/21 0215  ceFEPIme (MAXIPIME) 2 g in sodium chloride 0.9 % 100 mL IVPB        2 g 200 mL/hr over 30 Minutes Intravenous  Once 11/09/21 0210 11/09/21 0302   11/09/21 0215  metroNIDAZOLE (FLAGYL) IVPB 500 mg  Status:  Discontinued        500 mg 100 mL/hr over 60 Minutes Intravenous Every 12 hours 11/09/21 0210 11/12/21 1128   11/09/21 0215  vancomycin (VANCOCIN) IVPB 1000 mg/200 mL premix        1,000 mg 200 mL/hr over 60 Minutes Intravenous  Once 11/09/21 0210 11/09/21 0339       Subjective:  Seen and examined.  Alert awake.  She has been more awake since last 24 hours.  Denies any nausea or vomiting.  Denies any abdominal pain.  She tells me that she sent her daughter home to get some rest last night.  Objective: Vitals:   11/16/21 0100 11/16/21 0300 11/16/21 0400 11/16/21 0500  BP: (!) 189/58 132/88 (!) 142/59 (!) 143/76  Pulse: 66 65 (!) 58   Resp:   16   Temp:   97.6 F (36.4 C)   TempSrc:   Oral   SpO2: 100% 100% 100%   Weight:      Height:        Intake/Output Summary (Last 24 hours) at 11/16/2021 0945 Last data filed at 11/16/2021 2956 Gross per 24 hour  Intake 2190.6 ml  Output 700 ml  Net 1490.6 ml   Filed Weights   11/09/21 0330 11/13/21 1240  Weight: 79.8 kg 81 kg    Examination:  General: Chronically sick looking.  Frail and debilitated.  On room air.  Not in any distress. Cardiovascular: S1-S2 normal.  Regular rate rhythm. Respiratory: Bilateral clear.  No added sounds. Gastrointestinal: Soft.  Nontender.  Bowel sounds present. Ext: Chronic lymphedema bilateral lower extremities.  2+ edema on the dorsum of the feet. Neuro: Patient is alert and awake.  Oriented x2-3.  Interactive.  Generalized weak. Data  Reviewed: I have personally reviewed following labs and imaging studies  CBC: Recent Labs  Lab 11/10/21 0240 11/11/21 0243 11/12/21 0254 11/13/21 0300 11/14/21 0233  WBC 3.1* 3.8* 3.7* 3.9* 4.6  HGB 7.7* 8.4* 8.2* 8.8* 8.1*  HCT 24.8* 25.8* 26.3* 27.3* 26.4*  MCV 87.3 83.8 86.5 84.0 87.4  PLT 46* 53* 61* 86* 93*   Basic Metabolic Panel: Recent Labs  Lab 11/12/21 0254 11/13/21 0300 11/14/21 0233 11/15/21 0250 11/16/21 0246  NA 139 140 142 143 143  K 3.8 3.6 3.7 3.5 3.5  CL 114* 115* 119* 121* 119*  CO2 17* 17* 16* 17* 17*  GLUCOSE 57* 95 132* 120* 126*  BUN 69* 72* 68* 65* 63*  CREATININE 4.29* 4.50* 4.54* 4.49* 4.53*  CALCIUM 7.0* 7.1* 7.1* 7.2* 7.4*  PHOS  --   --   --  6.4*  --    GFR: Estimated  Creatinine Clearance: 12.3 mL/min (A) (by C-G formula based on SCr of 4.53 mg/dL (H)). Liver Function Tests: Recent Labs  Lab 11/10/21 0240 11/11/21 0243 11/12/21 0254 11/13/21 0300 11/14/21 0233 11/15/21 0250  AST 24 25 25 27 28   --   ALT 18 17 16 15 14   --   ALKPHOS 115 124 110 111 107  --   BILITOT 0.7 0.7 0.7 0.5 0.7  --   PROT 5.3* 5.4* 5.1* 5.5* 4.7*  --   ALBUMIN 2.5* 2.2* 2.0* 1.9* 1.7* 1.8*   No results for input(s): LIPASE, AMYLASE in the last 168 hours.  No results for input(s): AMMONIA in the last 168 hours. Coagulation Profile: No results for input(s): INR, PROTIME in the last 168 hours. Cardiac Enzymes: No results for input(s): CKTOTAL, CKMB, CKMBINDEX, TROPONINI in the last 168 hours. BNP (last 3 results) No results for input(s): PROBNP in the last 8760 hours. HbA1C: No results for input(s): HGBA1C in the last 72 hours. CBG: Recent Labs  Lab 11/15/21 1553 11/15/21 1934 11/15/21 2316 11/16/21 0314 11/16/21 0816  GLUCAP 133* 142* 125* 117* 95   Lipid Profile: No results for input(s): CHOL, HDL, LDLCALC, TRIG, CHOLHDL, LDLDIRECT in the last 72 hours. Thyroid Function Tests: No results for input(s): TSH, T4TOTAL, FREET4, T3FREE,  THYROIDAB in the last 72 hours.  Anemia Panel: No results for input(s): VITAMINB12, FOLATE, FERRITIN, TIBC, IRON, RETICCTPCT in the last 72 hours. Sepsis Labs: Recent Labs  Lab 11/10/21 0240 11/11/21 1414 11/12/21 0254 11/13/21 0300  PROCALCITON 0.59 0.29 0.27 0.24    Recent Results (from the past 240 hour(s))  Resp Panel by RT-PCR (Flu A&B, Covid) Nasopharyngeal Swab     Status: None   Collection Time: 11/09/21 12:13 AM   Specimen: Nasopharyngeal Swab; Nasopharyngeal(NP) swabs in vial transport medium  Result Value Ref Range Status   SARS Coronavirus 2 by RT PCR NEGATIVE NEGATIVE Final    Comment: (NOTE) SARS-CoV-2 target nucleic acids are NOT DETECTED.  The SARS-CoV-2 RNA is generally detectable in upper respiratory specimens during the acute phase of infection. The lowest concentration of SARS-CoV-2 viral copies this assay can detect is 138 copies/mL. A negative result does not preclude SARS-Cov-2 infection and should not be used as the sole basis for treatment or other patient management decisions. A negative result may occur with  improper specimen collection/handling, submission of specimen other than nasopharyngeal swab, presence of viral mutation(s) within the areas targeted by this assay, and inadequate number of viral copies(<138 copies/mL). A negative result must be combined with clinical observations, patient history, and epidemiological information. The expected result is Negative.  Fact Sheet for Patients:  EntrepreneurPulse.com.au  Fact Sheet for Healthcare Providers:  IncredibleEmployment.be  This test is no t yet approved or cleared by the [place] FDA and  has been authorized for detection and/or diagnosis of SARS-CoV-2 by FDA under an Emergency Use Authorization (EUA). This EUA will remain  in effect (meaning this test can be used) for the duration of the COVID-19 declaration under Section 564(b)(1) of the Act,  21 U.S.C.section 360bbb-3(b)(1), unless the authorization is terminated  or revoked sooner.       Influenza A by PCR NEGATIVE NEGATIVE Final   Influenza B by PCR NEGATIVE NEGATIVE Final    Comment: (NOTE) The Xpert Xpress SARS-CoV-2/FLU/RSV plus assay is intended as an aid in the diagnosis of influenza from Nasopharyngeal swab specimens and should not be used as a sole basis for treatment. Nasal washings and aspirates are unacceptable  for Xpert Xpress SARS-CoV-2/FLU/RSV testing.  Fact Sheet for Patients: EntrepreneurPulse.com.au  Fact Sheet for Healthcare Providers: IncredibleEmployment.be  This test is not yet approved or cleared by the [place] FDA and has been authorized for detection and/or diagnosis of SARS-CoV-2 by FDA under an Emergency Use Authorization (EUA). This EUA will remain in effect (meaning this test can be used) for the duration of the COVID-19 declaration under Section 564(b)(1) of the Act, 21 U.S.C. section 360bbb-3(b)(1), unless the authorization is terminated or revoked.  Performed at [facility], [place] [address], [place], [place] [address]   MRSA Next Gen by PCR, Nasal     Status: None   Collection Time: 11/09/21  2:29 AM   Specimen: Nasal Mucosa; Nasal Swab  Result Value Ref Range Status   MRSA by PCR Next Gen NOT DETECTED NOT DETECTED Final    Comment: (NOTE) The GeneXpert MRSA Assay (FDA approved for NASAL specimens only), is one component of a comprehensive MRSA colonization surveillance program. It is not intended to diagnose MRSA infection nor to guide or monitor treatment for MRSA infections. Test performance is not FDA approved in patients less than 98 years old. Performed at [facility], [place] [address] [place], [place] [address]   Urine Culture     Status: Abnormal   Collection Time: 11/09/21  9:44 AM   Specimen: Urine, Clean Catch  Result Value Ref Range  Status   Specimen Description   Final    URINE, CLEAN CATCH Performed at [facility], [place] [address], [place], [place] [address]    Special Requests   Final    NONE Performed at [facility], [place] [address], [place], [place] [address]    Culture (A)  Final    <10,000 COLONIES/mL INSIGNIFICANT GROWTH Performed at [place] [address] [place], [place] [address]    Report Status 11/10/2021 FINAL  Final  C Difficile Quick Screen w PCR reflex     Status: None   Collection Time: 11/10/21 11:08 AM   Specimen: STOOL  Result Value Ref Range Status   C Diff antigen NEGATIVE NEGATIVE Final   C Diff toxin NEGATIVE NEGATIVE Final   C Diff interpretation No C. difficile detected.  Final    Comment: Performed at [facility], [place] [address] [place], [place] [address]  Urine Culture     Status: Abnormal   Collection Time: 11/10/21  6:07 PM   Specimen: Urine, Catheterized  Result Value Ref Range Status   Specimen Description   Final    URINE, CATHETERIZED Performed at [place] [address], [place], [place] [address]    Special Requests   Final    NONE Performed at [facility], [place] [address], [place], [place] [address]    Culture 80,000 COLONIES/mL ENTEROBACTER CLOACAE (A)  Final   Report Status 11/13/2021 FINAL  Final   Organism ID, Bacteria ENTEROBACTER CLOACAE (A)  Final      Susceptibility   Enterobacter cloacae - MIC*    CEFAZOLIN >=64 RESISTANT Resistant     CIPROFLOXACIN <=0.25 SENSITIVE Sensitive     GENTAMICIN <=1 SENSITIVE Sensitive     IMIPENEM 0.5 SENSITIVE Sensitive     NITROFURANTOIN 64 INTERMEDIATE Intermediate     TRIMETH/SULFA <=20 SENSITIVE Sensitive     * 80,000 COLONIES/mL ENTEROBACTER CLOACAE  Culture, blood (routine x 2)     Status: None (Preliminary result)   Collection  Time: 11/11/21  8:17 AM   Specimen: BLOOD  Result Value Ref Range Status    Specimen Description   Final    BLOOD BLOOD RIGHT HAND Performed at [place] [address] [place], [place] [address]    Special Requests   Final    BOTTLES DRAWN AEROBIC ONLY Blood Culture adequate volume Performed at [place] [address], [place], [place] [address]    Culture   Final    NO GROWTH 4 DAYS Performed at [facility], [place] [address] [place], [place] [address]    Report Status PENDING  Incomplete  Culture, blood (routine x 2)     Status: None (Preliminary result)   Collection Time: 11/11/21  8:17 AM   Specimen: BLOOD  Result Value Ref Range Status   Specimen Description   Final    BLOOD BLOOD RIGHT HAND Performed at [place] [address] [place], [place] [address]    Special Requests   Final    BOTTLES DRAWN AEROBIC ONLY Blood Culture adequate volume Performed at [place] [address] [place], [place] [address]    Culture   Final    NO GROWTH 4 DAYS Performed at [facility], [place] [address] [place], [place] [address]    Report Status PENDING  Incomplete      Radiology Studies: No results found.  Scheduled Meds:  bacitracin-polymyxin b   Left Eye QHS   Chlorhexidine Gluconate Cloth  6 each Topical Daily   feeding supplement  237 mL Oral BID BM   mouth rinse  15 mL Mouth Rinse BID   multivitamin with minerals  1 tablet Oral Daily   rosuvastatin  10 mg Oral Daily   sodium bicarbonate  650 mg Oral TID   Continuous Infusions:  dextrose 5 % and 0.9% NaCl 50 mL/hr at 11/16/21 0625   meropenem (MERREM) IV 500 mg (11/16/21 0923)     LOS: 7 days   Total time spent: 30 minutes  Barb Merino, MD [facility]   If 7PM-7AM, please contact night-coverage 11/16/2021, 9:45 AM

## 2021-11-17 LAB — BASIC METABOLIC PANEL
Anion gap: 7 (ref 5–15)
BUN: 63 mg/dL — ABNORMAL HIGH (ref 8–23)
CO2: 18 mmol/L — ABNORMAL LOW (ref 22–32)
Calcium: 7.2 mg/dL — ABNORMAL LOW (ref 8.9–10.3)
Chloride: 115 mmol/L — ABNORMAL HIGH (ref 98–111)
Creatinine, Ser: 4.39 mg/dL — ABNORMAL HIGH (ref 0.44–1.00)
GFR, Estimated: 10 mL/min — ABNORMAL LOW (ref >60)
Glucose, Bld: 169 mg/dL — ABNORMAL HIGH (ref 70–99)
Potassium: 3.6 mmol/L (ref 3.5–5.1)
Sodium: 140 mmol/L (ref 135–145)

## 2021-11-17 LAB — GLUCOSE, CAPILLARY
Glucose-Capillary: 154 mg/dL — ABNORMAL HIGH (ref 70–99)
Glucose-Capillary: 126 mg/dL — ABNORMAL HIGH (ref 70–99)
Glucose-Capillary: 111 mg/dL — ABNORMAL HIGH (ref 70–99)
Glucose-Capillary: 104 mg/dL — ABNORMAL HIGH (ref 70–99)
Glucose-Capillary: 100 mg/dL — ABNORMAL HIGH (ref 70–99)

## 2021-11-17 MED ORDER — SODIUM CHLORIDE 0.9 % IV SOLN: INTRAVENOUS | Status: DC | PRN

## 2021-11-17 MED ORDER — FUROSEMIDE 10 MG/ML IJ SOLN
40.0000 mg | Freq: Once | INTRAMUSCULAR | Status: AC
  Administered 2021-11-17: 40 mg via INTRAVENOUS
  Filled 2021-11-17: qty 4

## 2021-11-17 MED ORDER — ALBUMIN HUMAN 25 % IV SOLN
12.5000 g | Freq: Once | INTRAVENOUS | Status: AC
  Administered 2021-11-17: 12.5 g via INTRAVENOUS
  Filled 2021-11-17: qty 50

## 2021-11-17 MED ORDER — LORATADINE 10 MG PO TABS
10.0000 mg | ORAL_TABLET | Freq: Two times a day (BID) | ORAL | Status: DC | PRN
  Administered 2021-11-17: 10 mg via ORAL
  Filled 2021-11-17: qty 1

## 2021-11-17 NOTE — Progress Notes (Signed)
Patient ID: Kara Mullins, female   DOB: 1951-01-18, 71 y.o.   MRN: [id number] [facility] Progress Note   Assessment/ Plan:   1. Acute kidney Injury on chronic kidney disease stage IIIb: Baseline creatinine ranging 1.3-1.6 in mid 2022 with current acute kidney injury that is suspected to be likely from ATN but cannot entirely rule out kidney injury associated with plasma cell dyscrasia.  Urine output fair with essentially stable renal function overnight and without any acute indications for dialysis.  As discussed with her daughter, she would likely have a significant worsening of her quality of life with chronic dialysis. 2.  Non-Anion gap metabolic acidosis: Secondary to acute kidney injury superimposed on chronic kidney disease and likely plasma cell dyscrasia.  Continue sodium bicarbonate 650 mg 3 times a day. 3.  Pancytopenia: Likely associated with sepsis but cannot entirely rule out the impact of plasma cell dyscrasia on this process.  She remains anemic but with continued improvement of leukopenia and thrombocytopenia.  She is status post broad-spectrum intravenous antibiotics. 4.  Hypertension: Blood pressures intermittently elevated, continue to avoid RAAS blockade in the setting of acute kidney injury. 5.  History of combined systolic/diastolic congestive heart failure: With some seemingly chronic pedal edema but without any respiratory findings-given stable renal function, will attempt diuresis.  Subjective:   A little less interactive this afternoon compared to what she was this morning and yesterday.  Apparently has waxing and waning mental status   Objective:   BP (!) 127/58 (BP Location: Left Arm)    Pulse 70    Temp 99 F (37.2 C) (Bladder)    Resp 14    Ht 5\' 5"  (1.651 m)    Wt 81 kg Comment: 1 pillow, 1 sheet, 1 blanket   SpO2 100%    BMI 29.72 kg/m   Intake/Output Summary (Last 24 hours) at 11/17/2021 1221 Last data filed at 11/17/2021 1000 Gross per 24 hour   Intake 786.48 ml  Output 600 ml  Net 186.48 ml   Weight change:   Physical Exam: Gen: Resting comfortably in bed, appears to try to respond to questions but largely nonverbal CVS: Pulse regular rhythm, normal rate, S1 and S2 normal Resp: Anteriorly clear to auscultation bilaterally, no rales/rhonchi Abd: Soft, obese, nontender, bowel sounds normal Ext: 1+ bilateral pitting pretibial edema, 1-2+ hip edema.  Imaging: No results found.  Labs: BMET Recent Labs  Lab 11/11/21 0243 11/12/21 0254 11/13/21 0300 11/14/21 0233 11/15/21 0250 11/16/21 0246 11/17/21 0250  NA 139 139 140 142 143 143 140  K 3.3* 3.8 3.6 3.7 3.5 3.5 3.6  CL 113* 114* 115* 119* 121* 119* 115*  CO2 18* 17* 17* 16* 17* 17* 18*  GLUCOSE 80 57* 95 132* 120* 126* 169*  BUN 70* 69* 72* 68* 65* 63* 63*  CREATININE 4.33* 4.29* 4.50* 4.54* 4.49* 4.53* 4.39*  CALCIUM 6.7* 7.0* 7.1* 7.1* 7.2* 7.4* 7.2*  PHOS  --   --   --   --  6.4*  --   --    CBC Recent Labs  Lab 11/11/21 0243 11/12/21 0254 11/13/21 0300 11/14/21 0233  WBC 3.8* 3.7* 3.9* 4.6  HGB 8.4* 8.2* 8.8* 8.1*  HCT 25.8* 26.3* 27.3* 26.4*  MCV 83.8 86.5 84.0 87.4  PLT 53* 61* 86* 93*    Medications:     bacitracin-polymyxin b   Left Eye QHS   Chlorhexidine Gluconate Cloth  6 each Topical Daily   feeding supplement  237 mL Oral BID  BM   mouth rinse  15 mL Mouth Rinse BID   multivitamin with minerals  1 tablet Oral Daily   rosuvastatin  10 mg Oral Daily   sodium bicarbonate  650 mg Oral TID   Elmarie Shiley, MD 11/17/2021, 12:21 PM

## 2021-11-17 NOTE — Progress Notes (Signed)
Advance care planning: Met with patient's husband and their daughter at the bedside.  Patient has been in and out of drowsiness today.  Today her vitals are stable, however remains more lethargic.  Temperature has improved.  Discussed about difficult to treat conditions, patient's inability to remain wakefulness.  Unknown outcome of the renal functions.  Family understands sickness.  Patient has also mentioned many times that she is ready to go to heaven.  We discussed continuing therapeutic options including antibiotics and IV fluid and monitoring for outcome. We discussed CODE STATUS, everybody agreed to no CPR.  Changed to DNR. We will involve palliative care.  If patient does not improve meaningfully, may even benefit with hospice at home.   No charge visit.

## 2021-11-17 NOTE — Progress Notes (Signed)
PROGRESS NOTE    Kara Mullins  IWL:[id number] DOB: [birth year] DOA: 11/08/2021 PCP: [name], NP    Brief Narrative:  71 y.o. female with medical history significant of chronic combined systolic and diastolic heart failure, generalized anxiety disorder, hyperlipidemia, hypertension, stage IIIb CKD who was brought to the emergency department due to inability to swallow less than an hour before arrival. While in ED, pt had episode of syncope, found to be hypothermic and evidence suggesting UTI. Pt was admitted for further work up. Found to have AKI and UTI as well.   Assessment & Plan:   Principal Problem:   Hypothermia Active Problems:   Essential hypertension   AKI (acute kidney injury) ([place])   Hyperlipidemia   Chronic combined systolic and diastolic congestive heart failure (HCC)   Stage 3b chronic kidney disease (CKD) (HCC)   Pancytopenia (HCC)   Severe protein-calorie malnutrition (HCC)  Principal Problem: Severe sepsis with UTI present on admission: Presented with hypothermia, pancytopenia, ARF, toxic metabolic encephalopathy Urine cx pos for enterobacter, now on meropenem. Blood cultures negative. Urine cultures with Enterobacter. Sepsis physiology improving.  Patient is still with low temperature.  We will check a.m. cortisol levels tomorrow morning. We will continue meropenem while in the hospital or until patient has Foley catheter.   Active Problems: Dysphagia/dysarthria/acute metabolic encephalopathy. MRI of the brain was negative for any acute stroke.  This is likely due to metabolic encephalopathy.  Improving.     Essential hypertension Holding diuretics given ARF Coreg now on hold, per below Cont to monitor BP and heart rate. On PRN hydralazine     AKI (acute kidney injury) ([place]) Superimposed on stage 3b chronic kidney disease (CKD) (HCC) Gentle IV fluid.  Currently remains on dextrose normal saline 50 mL/h. Renal US reviewed.  Chronic bilateral  hydronephrosis. Patient had adequate urine output last 24 hours, recorded 600 mL.  No evidence of uremia.  Creatinine stabilizing.  Followed by nephrology.  Not a good candidate for dialysis.  Await renal recovery. Foley catheter is indicated because of acute renal failure and multiple episodes of urinary retention.  Continue until stabilization or patient able to get out of the bed to the commode.   Hyperlipidemia Continue rosuvastatin 10 mg p.o. daily.     Chronic combined systolic and diastolic congestive heart failure (HCC) No signs of decompensation. CXR with evidence of vascular congestion and cardiomegaly Holding furosemide, losartan and spironolactone given ARF On minimal O2     Severe protein-calorie malnutrition (HCC) Protein supplementation. Nutritional services evaluation.  Sinus Bradycardia -HR recently noted to be as low as the 30's, asymptomatic -Discussed with Cardiology who recommends holding coreg -HR has improved off coreg   Physical deconditioning: Work with PT OT and speech.  Refer to SNF. Transfer to SNF when cleared by nephrology.  Anticipate another 24 to 48 hours.  DVT prophylaxis: SCD's Code Status: Full Family Communication: None today.  Status is: Inpatient  Remains inpatient appropriate because: Severity of illness, on IV fluids.  Awaiting renal function recovery.    Consultants:  Nephrology  Procedures:  None.  Antimicrobials: Anti-infectives (From admission, onward)    Start     Dose/Rate Route Frequency Ordered Stop   11/14/21 2200  meropenem (MERREM) 500 mg in sodium chloride 0.9 % 100 mL IVPB        500 mg 200 mL/hr over 30 Minutes Intravenous Every 12 hours 11/14/21 1332     11/13/21 1300  meropenem (MERREM) 1 g in sodium chloride 0.9 %  100 mL IVPB  Status:  Discontinued        1 g 200 mL/hr over 30 Minutes Intravenous Every 12 hours 11/13/21 1151 11/14/21 1332   11/09/21 2200  ceFEPIme (MAXIPIME) 2 g in sodium chloride 0.9 % 100 mL  IVPB  Status:  Discontinued        2 g 200 mL/hr over 30 Minutes Intravenous Every 24 hours 11/09/21 0323 11/13/21 1148   11/09/21 0322  vancomycin variable dose per unstable renal function (pharmacist dosing)  Status:  Discontinued         Does not apply See admin instructions 11/09/21 0323 11/09/21 1355   11/09/21 0215  ceFEPIme (MAXIPIME) 2 g in sodium chloride 0.9 % 100 mL IVPB        2 g 200 mL/hr over 30 Minutes Intravenous  Once 11/09/21 0210 11/09/21 0302   11/09/21 0215  metroNIDAZOLE (FLAGYL) IVPB 500 mg  Status:  Discontinued        500 mg 100 mL/hr over 60 Minutes Intravenous Every 12 hours 11/09/21 0210 11/12/21 1128   11/09/21 0215  vancomycin (VANCOCIN) IVPB 1000 mg/200 mL premix        1,000 mg 200 mL/hr over 60 Minutes Intravenous  Once 11/09/21 0210 11/09/21 0339       Subjective:  Seen and examined.  No overnight events.  Patient denies any complaints.  More awake and alert now.  Objective: Vitals:   11/17/21 0500 11/17/21 0600 11/17/21 0653 11/17/21 0827  BP: (!) 171/87 137/66    Pulse: 70 70    Resp:      Temp:   (!) 97.2 F (36.2 C) (!) 97.2 F (36.2 C)  TempSrc:    Axillary  SpO2: 100% 99%    Weight:      Height:        Intake/Output Summary (Last 24 hours) at 11/17/2021 1039 Last data filed at 11/17/2021 0647 Gross per 24 hour  Intake 827.31 ml  Output 600 ml  Net 227.31 ml   Filed Weights   11/09/21 0330 11/13/21 1240  Weight: 79.8 kg 81 kg    Examination:  General: Chronically sick looking.  Frail and debilitated.  On room air.  Not in any distress. Cardiovascular: S1-S2 normal.  Regular rate rhythm. Respiratory: Bilateral clear.  No added sounds. Gastrointestinal: Soft.  Nontender.  Bowel sounds present. Ext: Chronic lymphedema bilateral lower extremities.  2+ edema on the dorsum of the feet. Neuro: Patient is alert and awake.  She is oriented.  Interactive.    Data Reviewed: I have personally reviewed following labs and imaging  studies  CBC: Recent Labs  Lab 11/11/21 0243 11/12/21 0254 11/13/21 0300 11/14/21 0233  WBC 3.8* 3.7* 3.9* 4.6  HGB 8.4* 8.2* 8.8* 8.1*  HCT 25.8* 26.3* 27.3* 26.4*  MCV 83.8 86.5 84.0 87.4  PLT 53* 61* 86* 93*   Basic Metabolic Panel: Recent Labs  Lab 11/13/21 0300 11/14/21 0233 11/15/21 0250 11/16/21 0246 11/17/21 0250  NA 140 142 143 143 140  K 3.6 3.7 3.5 3.5 3.6  CL 115* 119* 121* 119* 115*  CO2 17* 16* 17* 17* 18*  GLUCOSE 95 132* 120* 126* 169*  BUN 72* 68* 65* 63* 63*  CREATININE 4.50* 4.54* 4.49* 4.53* 4.39*  CALCIUM 7.1* 7.1* 7.2* 7.4* 7.2*  PHOS  --   --  6.4*  --   --    GFR: Estimated Creatinine Clearance: 12.7 mL/min (A) (by C-G formula based on SCr of 4.39 mg/dL (H)).  Liver Function Tests: Recent Labs  Lab 11/11/21 0243 11/12/21 0254 11/13/21 0300 11/14/21 0233 11/15/21 0250  AST 25 25 27 28   --   ALT 17 16 15 14   --   ALKPHOS 124 110 111 107  --   BILITOT 0.7 0.7 0.5 0.7  --   PROT 5.4* 5.1* 5.5* 4.7*  --   ALBUMIN 2.2* 2.0* 1.9* 1.7* 1.8*   No results for input(s): LIPASE, AMYLASE in the last 168 hours.  No results for input(s): AMMONIA in the last 168 hours. Coagulation Profile: No results for input(s): INR, PROTIME in the last 168 hours. Cardiac Enzymes: No results for input(s): CKTOTAL, CKMB, CKMBINDEX, TROPONINI in the last 168 hours. BNP (last 3 results) No results for input(s): PROBNP in the last 8760 hours. HbA1C: No results for input(s): HGBA1C in the last 72 hours. CBG: Recent Labs  Lab 11/16/21 1603 11/16/21 1939 11/16/21 2342 11/17/21 0336 11/17/21 0756  GLUCAP 138* 156* 145* 154* 126*   Lipid Profile: No results for input(s): CHOL, HDL, LDLCALC, TRIG, CHOLHDL, LDLDIRECT in the last 72 hours. Thyroid Function Tests: No results for input(s): TSH, T4TOTAL, FREET4, T3FREE, THYROIDAB in the last 72 hours.  Anemia Panel: No results for input(s): VITAMINB12, FOLATE, FERRITIN, TIBC, IRON, RETICCTPCT in the last 72  hours. Sepsis Labs: Recent Labs  Lab 11/11/21 1414 11/12/21 0254 11/13/21 0300  PROCALCITON 0.29 0.27 0.24    Recent Results (from the past 240 hour(s))  Resp Panel by RT-PCR (Flu A&B, Covid) Nasopharyngeal Swab     Status: None   Collection Time: 11/09/21 12:13 AM   Specimen: Nasopharyngeal Swab; Nasopharyngeal(NP) swabs in vial transport medium  Result Value Ref Range Status   SARS Coronavirus 2 by RT PCR NEGATIVE NEGATIVE Final    Comment: (NOTE) SARS-CoV-2 target nucleic acids are NOT DETECTED.  The SARS-CoV-2 RNA is generally detectable in upper respiratory specimens during the acute phase of infection. The lowest concentration of SARS-CoV-2 viral copies this assay can detect is 138 copies/mL. A negative result does not preclude SARS-Cov-2 infection and should not be used as the sole basis for treatment or other patient management decisions. A negative result may occur with  improper specimen collection/handling, submission of specimen other than nasopharyngeal swab, presence of viral mutation(s) within the areas targeted by this assay, and inadequate number of viral copies(<138 copies/mL). A negative result must be combined with clinical observations, patient history, and epidemiological information. The expected result is Negative.  Fact Sheet for Patients:  EntrepreneurPulse.com.au  Fact Sheet for Healthcare Providers:  IncredibleEmployment.be  This test is no t yet approved or cleared by the [place] FDA and  has been authorized for detection and/or diagnosis of SARS-CoV-2 by FDA under an Emergency Use Authorization (EUA). This EUA will remain  in effect (meaning this test can be used) for the duration of the COVID-19 declaration under Section 564(b)(1) of the Act, 21 U.S.C.section 360bbb-3(b)(1), unless the authorization is terminated  or revoked sooner.       Influenza A by PCR NEGATIVE NEGATIVE Final   Influenza B by  PCR NEGATIVE NEGATIVE Final    Comment: (NOTE) The Xpert Xpress SARS-CoV-2/FLU/RSV plus assay is intended as an aid in the diagnosis of influenza from Nasopharyngeal swab specimens and should not be used as a sole basis for treatment. Nasal washings and aspirates are unacceptable for Xpert Xpress SARS-CoV-2/FLU/RSV testing.  Fact Sheet for Patients: EntrepreneurPulse.com.au  Fact Sheet for Healthcare Providers: IncredibleEmployment.be  This test is not yet approved or  cleared by the [place] and has been authorized for detection and/or diagnosis of SARS-CoV-2 by FDA under an Emergency Use Authorization (EUA). This EUA will remain in effect (meaning this test can be used) for the duration of the COVID-19 declaration under Section 564(b)(1) of the Act, 21 U.S.C. section 360bbb-3(b)(1), unless the authorization is terminated or revoked.  Performed at [facility], [place] [address] [place], [place] [address]   MRSA Next Gen by PCR, Nasal     Status: None   Collection Time: 11/09/21  2:29 AM   Specimen: Nasal Mucosa; Nasal Swab  Result Value Ref Range Status   MRSA by PCR Next Gen NOT DETECTED NOT DETECTED Final    Comment: (NOTE) The GeneXpert MRSA Assay (FDA approved for NASAL specimens only), is one component of a comprehensive MRSA colonization surveillance program. It is not intended to diagnose MRSA infection nor to guide or monitor treatment for MRSA infections. Test performance is not FDA approved in patients less than 56 years old. Performed at [facility] [place] [address], [place], [place] [address]   Urine Culture     Status: Abnormal   Collection Time: 11/09/21  9:44 AM   Specimen: Urine, Clean Catch  Result Value Ref Range Status   Specimen Description   Final    URINE, CLEAN CATCH Performed at [facility], [place] [address], [place], [place] [address]    Special  Requests   Final    NONE Performed at [facility], [place] [address] [place], [place] [address]    Culture (A)  Final    <10,000 COLONIES/mL INSIGNIFICANT GROWTH Performed at [place] [address] [place], [place] [address]    Report Status 11/10/2021 FINAL  Final  C Difficile Quick Screen w PCR reflex     Status: None   Collection Time: 11/10/21 11:08 AM   Specimen: STOOL  Result Value Ref Range Status   C Diff antigen NEGATIVE NEGATIVE Final   C Diff toxin NEGATIVE NEGATIVE Final   C Diff interpretation No C. difficile detected.  Final    Comment: Performed at [facility], [place] [address] [place], [place] [address]  Urine Culture     Status: Abnormal   Collection Time: 11/10/21  6:07 PM   Specimen: Urine, Catheterized  Result Value Ref Range Status   Specimen Description   Final    URINE, CATHETERIZED Performed at [place] [address] [place], [place] [address]    Special Requests   Final    NONE Performed at [facility], [place] [address], [place], [place] [address]    Culture 80,000 COLONIES/mL ENTEROBACTER CLOACAE (A)  Final   Report Status 11/13/2021 FINAL  Final   Organism ID, Bacteria ENTEROBACTER CLOACAE (A)  Final      Susceptibility   Enterobacter cloacae - MIC*    CEFAZOLIN >=64 RESISTANT Resistant     CIPROFLOXACIN <=0.25 SENSITIVE Sensitive     GENTAMICIN <=1 SENSITIVE Sensitive     IMIPENEM 0.5 SENSITIVE Sensitive     NITROFURANTOIN 64 INTERMEDIATE Intermediate     TRIMETH/SULFA <=20 SENSITIVE Sensitive     * 80,000 COLONIES/mL ENTEROBACTER CLOACAE  Culture, blood (routine x 2)     Status: None   Collection Time: 11/11/21  8:17 AM   Specimen: BLOOD  Result Value Ref Range Status   Specimen Description   Final    BLOOD BLOOD RIGHT  HAND Performed at [facility], [place] [address] [place], [place] [address]    Special Requests   Final     BOTTLES DRAWN AEROBIC ONLY Blood Culture adequate volume Performed at [place] [address], [place], [place] [address]    Culture   Final    NO GROWTH 5 DAYS Performed at [facility], [place] [address], [place], [place] [address]    Report Status 11/16/2021 FINAL  Final  Culture, blood (routine x 2)     Status: None   Collection Time: 11/11/21  8:17 AM   Specimen: BLOOD  Result Value Ref Range Status   Specimen Description   Final    BLOOD BLOOD RIGHT HAND Performed at [place] [address], [place], [place] [address]    Special Requests   Final    BOTTLES DRAWN AEROBIC ONLY Blood Culture adequate volume Performed at [place] [address] [place], [place] [address]    Culture   Final    NO GROWTH 5 DAYS Performed at [facility], [place] [address], [place], [place] [address]    Report Status 11/16/2021 FINAL  Final      Radiology Studies: No results found.  Scheduled Meds:  bacitracin-polymyxin b   Left Eye QHS   Chlorhexidine Gluconate Cloth  6 each Topical Daily   feeding supplement  237 mL Oral BID BM   mouth rinse  15 mL Mouth Rinse BID   multivitamin with minerals  1 tablet Oral Daily   rosuvastatin  10 mg Oral Daily   sodium bicarbonate  650 mg Oral TID   Continuous Infusions:  sodium chloride 10 mL/hr at 11/17/21 1021   dextrose 5 % and 0.9% NaCl Stopped (11/17/21 1022)   meropenem (MERREM) IV 500 mg (11/17/21 1023)     LOS: 8 days   Total time spent: 30 minutes  Barb Merino, MD [facility]   If 7PM-7AM, please contact night-coverage 11/17/2021, 10:39 AM

## 2021-11-17 NOTE — Progress Notes (Signed)
Patient has refused all evening medications.  Patient stated she is "tired of taking medicine and just wants to rest".  Patient made statement while family was present.

## 2021-11-17 NOTE — Progress Notes (Signed)
Patient was noted to have a temp of 99.0. Patient refused tylenol.

## 2021-11-17 NOTE — Progress Notes (Signed)
Pharmacy Antibiotic Note  Kara Mullins is a 71 y.o. female admitted on 11/08/2021 with inability to swallow and found to be hypothermic with possible UTI.Mar[place] Kitchen  Pharmacy consulted on 1/31 to transition patient from cefepime to meropenem as patient remains hypotensive and hypothermic on cefepime.   Today, 11/17/2021: - day #9 abx, D5 meropenem - wbc wnl - scr still elevated at 4.39 (crcl~12) --> nephrology team seeing pt - 80K enterobacter cloacae in ucx  Plan: - Continue Meropenem to 500 mg IV q12h  - Will f/u renal function, culture results, and clinical course - ID recommends 10-14 days total of abx's and could transition to PO Cipro at discharge _______________________________________________  Height: 5\' 5"  (165.1 cm) Weight: 81 kg (178 lb 9.2 oz) (1 pillow, 1 sheet, 1 blanket) IBW/kg (Calculated) : 57  Temp (24hrs), Avg:95.9 F (35.5 C), Min:93.3 F (34.1 C), Max:98 F (36.7 C)  Recent Labs  Lab 11/11/21 0243 11/12/21 0254 11/13/21 0300 11/14/21 0233 11/15/21 0250 11/16/21 0246 11/17/21 0250  WBC 3.8* 3.7* 3.9* 4.6  --   --   --   CREATININE 4.33* 4.29* 4.50* 4.54* 4.49* 4.53* 4.39*     Estimated Creatinine Clearance: 12.7 mL/min (A) (by C-G formula based on SCr of 4.39 mg/dL (H)).    No Known Allergies  1/27 vanc x1  1/27 cefepime >>1/31 1/27 flagyl >> 1/30 1/31 meropenem>>   1/27 Ucx: <10k colonies  1/27 MRSA PCR: negative 1/29 BCx x2:  1/28 UCx: 80 K enterobacter (R= ancef, I = nitrof) FINAL  Thank you for allowing pharmacy to be a part of this patients care.  Kara Mead 11/17/2021 10:40 AM

## 2021-11-18 DIAGNOSIS — R531 Weakness: Secondary | ICD-10-CM

## 2021-11-18 LAB — GLUCOSE, CAPILLARY
Glucose-Capillary: 122 mg/dL — ABNORMAL HIGH (ref 70–99)
Glucose-Capillary: 135 mg/dL — ABNORMAL HIGH (ref 70–99)
Glucose-Capillary: 152 mg/dL — ABNORMAL HIGH (ref 70–99)

## 2021-11-18 MED ORDER — HYDROMORPHONE HCL 1 MG/ML IJ SOLN: 0.5000 mg | INTRAMUSCULAR | Status: DC | PRN

## 2021-11-18 NOTE — Progress Notes (Deleted)
Cardiology Office Note:    Date:  11/18/2021   ID:  Kara Mullins, DOB 02-06-1951, MRN [id number]  PCP:  Kara Hughs, NP  Cardiologist:  Kara Heinz, MD  Electrophysiologist:  None   Referring MD: Kara Hughs, NP   No chief complaint on file.    History of Present Illness:    Kara Mullins is a 71 y.o. female with a hx of chronic combined systolic metastatic heart failure, hypertension, lymphedema who presents for hospital follow-up.  She was admitted to [facility] on 03/05/2021 with shortness of breath.  Echocardiogram showed LVEF 45%, severe LVH, concerning for cardiac amyloid.  Work-up for amyloidosis showed abnormal light chains, hematology was consulted underwent bone marrow biopsy.  Course is complicated by AKI thought to be secondary to contrast nephropathy.  Nephrology was consulted and she was diuresed with IV Lasix with improvement in her kidney function.  She was discharged on oral Lasix 40 mg daily.  Cardiac MRI on 04/30/2021 showed no evidence of cardiac amyloidosis, moderate asymmetric LVH not meeting HCM criteria, LVEF 53%, RVEF 57%, no LGE.  Since last clinic visit, she is doing well. She currently is taking the Furosemide 40 MG. She experiences shortness of breath when feeling hot and LE edema at the end of the day, otherwise denies any complaints. Denies any chest pains, lightheadedness, syncope, or palpitations.   Wt Readings from Last 3 Encounters:  11/13/21 178 lb 9.2 oz (81 kg)  11/06/21 167 lb (75.8 kg)  08/27/21 168 lb 12.8 oz (76.6 kg)      Past Medical History:  Diagnosis Date   Chronic combined systolic and diastolic congestive heart failure ([place]) 11/09/2021   Generalized anxiety disorder    Hyperlipidemia 11/09/2021   Hypertension    Stage 3b chronic kidney disease (CKD) ([place]) 11/09/2021    Past Surgical History:  Procedure Laterality Date   CATARACT EXTRACTION, BILATERAL     combined systolic and diastolic congestive heart  failure       Current Medications: No outpatient medications have been marked as taking for the 11/20/21 encounter (Appointment) with Kara Heinz, MD.     Allergies:   Patient has no known allergies.   Social History   Socioeconomic History   Marital status: Divorced    Spouse name: Not on file   Number of children: Not on file   Years of education: Not on file   Highest education level: Not on file  Occupational History   Not on file  Tobacco Use   Smoking status: Never   Smokeless tobacco: Never  Vaping Use   Vaping Use: Never used  Substance and Sexual Activity   Alcohol use: Not Currently   Drug use: Never   Sexual activity: Not on file  Other Topics Concern   Not on file  Social History Narrative   Tobacco use, amount per day now: N/A   Past tobacco use, amount per day: N/A   How many years did you use tobacco: N/A   Alcohol use (drinks per week): N/A   Diet:   Do you drink/eat things with caffeine: Coffee   Marital status: Divorced                                 What year were you married? 1992   Do you live in a house, apartment, assisted living, condo, trailer, etc.?   Is it one or more  stories?   How many persons live in your home? 2   Do you have pets in your home?( please list) No   Highest Level of education completed? High School   Current or past profession: Restaurant Employee   Do you exercise?   Yes                               Type and how often? Walk   Do you have a living will? No   Do you have a DNR form?    No                               If not, do you want to discuss one?   Do you have signed POA/HPOA forms? No                       If so, please bring to you appointment      Do you have any difficulty bathing or dressing yourself? No   Do you have any difficulty preparing food or eating? No   Do you have any difficulty managing your medications? No   Do you have any difficulty managing your finances? No   Do you have any  difficulty affording your medications? No   Social Determinants of Radio broadcast assistant Strain: Not on file  Food Insecurity: Not on file  Transportation Needs: Not on file  Physical Activity: Not on file  Stress: Not on file  Social Connections: Not on file     Family History: The patient's family history includes Throat cancer in her father.  ROS:   Please see the history of present illness.    (+) LE edema (+) shortness of breath  All other systems reviewed and are negative.  EKGs/Labs/Other Studies Reviewed:    The following studies were reviewed today: Cardiac morphology 07/22:  IMPRESSION:  1.  No evidence of cardiac amyloidosis   2. Asymmetric LV hypertrophy measuring up to 29m in basal septum (724min posterior wall), not meeting criteria for hypertrophic cardiomyopathy (<1575m  3.  Mild LV dilatation with low normal systolic function (EF 53%53% 4.  Normal RV size and systolic function (EF 57%61% 5.  No late gadolinium enhancement to suggest myocardial scar  Echo 03/05/2021: 1. Left ventricular ejection fraction, by estimation, is 45%. The left  ventricle has mildly decreased function. The left ventricle demonstrates  global hypokinesis. There is severe left ventricular hypertrophy. Left  ventricular diastolic parameters are  consistent with Grade I diastolic dysfunction (impaired relaxation). Would  consider cardiac amyloidosis.   2. Right ventricular systolic function is normal. The right ventricular  size is normal. There is mildly elevated pulmonary artery systolic  pressure. The estimated right ventricular systolic pressure is 41.44.3Hg.   3. Left atrial size was severely dilated.   4. The mitral valve is normal in structure. Mild to moderate mitral valve  regurgitation. No evidence of mitral stenosis.   5. The aortic valve is tricuspid. Aortic valve regurgitation is trivial.  No aortic stenosis is present.   6. Aortic dilatation noted. There  is mild dilatation of the aortic root,  measuring 37 mm.   7. The inferior vena cava is dilated in size with <50% respiratory  variability, suggesting right atrial pressure of 15 mmHg.   8. There  is a left pleural effusion and trivial pericardial effusion.   [place] LE Venous Bilateral 03/05/2021: BILATERAL:  - No evidence of deep vein thrombosis seen in the lower extremities,  bilaterally.  - No evidence of superficial venous thrombosis in the lower extremities,  bilaterally.  -No evidence of popliteal cyst, bilaterally.  -Significant subcutaneous edema found throughout lower extremities.  CT Angio Chest 03/05/2021: IMPRESSION: 1. No pulmonary embolus. 2. Pulmonary edema with trace to small volume left and small to moderate volume right pleural effusions. 3.  Aortic Atherosclerosis (ICD10-I70.0).  EKG:   07/22:no EKG was ordered today. 03/19/2021: NSR, rate 57, nonspecific T wave flattening  Recent Labs: 05/07/2021: Magnesium 2.4 11/08/2021: B Natriuretic Peptide 588.4 11/09/2021: TSH 3.941 11/14/2021: ALT 14; Hemoglobin 8.1; Platelets 93 11/17/2021: BUN 63; Creatinine, Ser 4.39; Potassium 3.6; Sodium 140  Recent Lipid Panel    Component Value Date/Time   CHOL 212 (H) 08/27/2021 0956   TRIG 78 08/27/2021 0956   HDL 67 08/27/2021 0956   CHOLHDL 3.2 08/27/2021 0956   LDLCALC 128 (H) 08/27/2021 0956    Physical Exam:    VS:  There were no vitals taken for this visit.    Wt Readings from Last 3 Encounters:  11/13/21 178 lb 9.2 oz (81 kg)  11/06/21 167 lb (75.8 kg)  08/27/21 168 lb 12.8 oz (76.6 kg)     GEN: Well nourished, well developed in no acute distress HEENT: Normal NECK: No JVD; No carotid bruits LYMPHATICS: No lymphadenopathy CARDIAC: RRR, no murmurs, rubs, gallops RESPIRATORY:  Clear to auscultation without rales, wheezing or rhonchi  ABDOMEN: Soft, non-tender, non-distended MUSCULOSKELETAL: Trace edema; Compression stockings in place SKIN: Warm and dry NEUROLOGIC:   Alert and oriented x 3 PSYCHIATRIC:  Normal affect   ASSESSMENT:    No diagnosis found.  PLAN:    Chronic systolic and diastolic heart failure: Diagnosed during recent admission, with EF 45% and severe LVH on echo but relatively low voltage on EKG, concerning for cardiac amyloidosis.  Amyloid work-up showed abnormal light chains and underwent bone marrow biopsy with hematology. -Cardiac MRI on 04/30/2021 showed no evidence of cardiac amyloidosis, moderate asymmetric LVH not meeting HCM criteria, LVEF 53%, RVEF 57%, no LGE. -Continue carvedilol 12.5 mg twice daily -Continue spironolactone 12.5 mg daily -Continue losartan 25 mg daily -Continue Lasix 40 mg daily -Check BMP/mag  AKI: Creatinine 1.3 on admission on 03/04/2021, peaked at 2.0.  Was 1.60 on discharge on 03/15/2021.  Will check BMET  Hypertension: On carvedilol, spironolactone, losartan, and Lasix.  Mildly elevated in clinic today, asked patient to check BP twice daily for next week and call with results  Hyperlipidemia: LDL 125 on 04/30/2021.  10-year ASCVD risk score 13%.  Will start rosuvastatin 10 mg daily  RTC in ***   Medication Adjustments/Labs and Tests Ordered: Current medicines are reviewed at length with the patient today.  Concerns regarding medicines are outlined above.  No orders of the defined types were placed in this encounter.  No orders of the defined types were placed in this encounter.   There are no Patient Instructions on file for this visit.      Signed, Kara Heinz, MD  11/18/2021 4:00 PM    [place]

## 2021-11-18 NOTE — Consult Note (Signed)
Consultation Note Date: 11/18/2021   Patient Name: Kara Mullins  DOB: Jun 04, 1951  MRN: [id number]  Age / Sex: 71 y.o., female  PCP: [name], [name], NP Referring Physician: Barb Merino, MD  Reason for Consultation: Establishing goals of care  HPI/Patient Profile: 71 y.o. female  admitted on 11/08/2021    Clinical Assessment and Goals of Care: 71 year old lady with chronic combined systolic and diastolic heart failure, generalized anxiety disorder, dyslipidemia, hypertension, stage III chronic kidney disease brought into the emergency department because of difficulty swallowing, during evaluation in the emergency department was found to have episode of syncope, hypothermic, admitted for acute kidney injury and urinary tract infection. Patient was placed on antibiotics, was seen and evaluated by renal services. Patient began declining lab work and oral medications, requested for comfort care.  Patient expressed her wishes for not undergoing current scope of hospitalization.  Patient's care plan was reflected to comfort measures which expressed her wishes and palliative consult was placed. Patient is resting in bed.  She appears with generalized weakness.  She gets fatigued easily and does not talk much.  At the time of this initial palliative encounter, several friends and family members as well as her pastor's were in the room.  Patient was undergoing a prior session.  She declined any acute uncontrolled symptoms at this time.  Daughter was at bedside. Palliative medicine is specialized medical care for people living with serious illness. It focuses on providing relief from the symptoms and stress of a serious illness. The goal is to improve quality of life for both the patient and the family. Goals of care: Broad aims of medical therapy in relation to the patient's values and preferences. Our aim is to provide  medical care aimed at enabling patients to achieve the goals that matter most to them, given the circumstances of their particular medical situation and their constraints.  Patient simply stated that she believes very strongly that it is her time to go.  She reflected that she had lived a good life and was thankful for the support of her friends and family.  Offered active listening supportive presence and other therapeutic techniques.  See below.  Thank you for the consult.  NEXT OF KIN Daughter who is present at bedside.  SUMMARY OF RECOMMENDATIONS   Agree with DO NOT RESUSCITATE Recommend full scope of comfort measures Add low-dose IV opioids only to be used on an as-needed basis for pain and/or dyspnea Monitor hospital course, anticipate hospital death, will need to follow hospital course and overall disease trajectory of illness to see if introduction of hospice services and exploring whether home with hospice versus residential hospice appears appropriate in the next day or 2. Palliative to follow along, thank you for the consult.  Code Status/Advance Care Planning: DNR   Symptom Management:     Palliative Prophylaxis:  Delirium Protocol  Additional Recommendations (Limitations, Scope, Preferences): Full Comfort Care  Psycho-social/Spiritual:  Desire for further Chaplaincy support:yes Additional Recommendations: Caregiving  Support/Resources  Prognosis:  Hours - Days  Discharge Planning: Anticipated Hospital Death      Primary Diagnoses: Present on Admission:  Hypothermia  Essential hypertension  Hyperlipidemia  Chronic combined systolic and diastolic congestive heart failure (HCC)  AKI (acute kidney injury) ([place])  Stage 3b chronic kidney disease (CKD) ([place])  Pancytopenia ([place])  Severe protein-calorie malnutrition ([place])   I have reviewed the medical record, interviewed the patient and family, and examined the patient. The following aspects are pertinent.  Past  Medical History:  Diagnosis Date   Chronic combined systolic and diastolic congestive heart failure (HCC) 11/09/2021   Generalized anxiety disorder    Hyperlipidemia 11/09/2021   Hypertension    Stage 3b chronic kidney disease (CKD) ([place]) 11/09/2021   Social History   Socioeconomic History   Marital status: Divorced    Spouse name: Not on file   Number of children: Not on file   Years of education: Not on file   Highest education level: Not on file  Occupational History   Not on file  Tobacco Use   Smoking status: Never   Smokeless tobacco: Never  Vaping Use   Vaping Use: Never used  Substance and Sexual Activity   Alcohol use: Not Currently   Drug use: Never   Sexual activity: Not on file  Other Topics Concern   Not on file  Social History Narrative   Tobacco use, amount per day now: N/A   Past tobacco use, amount per day: N/A   How many years did you use tobacco: N/A   Alcohol use (drinks per week): N/A   Diet:   Do you drink/eat things with caffeine: Coffee   Marital status: Divorced                                 What year were you married? 1992   Do you live in a house, apartment, assisted living, condo, trailer, etc.?   Is it one or more stories?   How many persons live in your home? 2   Do you have pets in your home?( please list) No   Highest Level of education completed? High School   Current or past profession: Restaurant Employee   Do you exercise?   Yes                               Type and how often? Walk   Do you have a living will? No   Do you have a DNR form?    No                               If not, do you want to discuss one?   Do you have signed POA/HPOA forms? No                       If so, please bring to you appointment      Do you have any difficulty bathing or dressing yourself? No   Do you have any difficulty preparing food or eating? No   Do you have any difficulty managing your medications? No   Do you have any difficulty managing  your finances? No   Do you have any difficulty affording your medications? No   Social Determinants of Radio broadcast assistant Strain: Not on file  Food  Insecurity: Not on file  Transportation Needs: Not on file  Physical Activity: Not on file  Stress: Not on file  Social Connections: Not on file   Family History  Problem Relation Age of Onset   Throat cancer Father    Scheduled Meds:  bacitracin-polymyxin b   Left Eye QHS   Chlorhexidine Gluconate Cloth  6 each Topical Daily   feeding supplement  237 mL Oral BID BM   mouth rinse  15 mL Mouth Rinse BID   multivitamin with minerals  1 tablet Oral Daily   rosuvastatin  10 mg Oral Daily   sodium bicarbonate  650 mg Oral TID   Continuous Infusions:  sodium chloride Stopped (11/17/21 1145)   dextrose 5 % and 0.9% NaCl 50 mL/hr at 11/18/21 1200   meropenem (MERREM) IV Stopped (11/17/21 1053)   PRN Meds:.sodium chloride, acetaminophen **OR** acetaminophen, hydrALAZINE, HYDROmorphone (DILAUDID) injection, lip balm, loratadine, ondansetron **OR** ondansetron (ZOFRAN) IV Medications Prior to Admission:  Prior to Admission medications   Medication Sig Start Date End Date Taking? Authorizing Provider  carvedilol (COREG) 6.25 MG tablet Take 1 tablet (6.25 mg total) by mouth 2 (two) times daily with a meal. 11/06/21 02/04/22 Yes [name], [name], NP  furosemide (LASIX) 40 MG tablet Take 1 tablet (40 mg total) by mouth daily. 04/04/21  Yes Donato Heinz, MD  losartan (COZAAR) 25 MG tablet Take 25 mg by mouth daily.   Yes [provider]  rosuvastatin (CRESTOR) 10 MG tablet Take 1 tablet (10 mg total) by mouth daily. 05/07/21 11/09/21 Yes Donato Heinz, MD  spironolactone (ALDACTONE) 25 MG tablet Take 0.5 tablets (12.5 mg total) by mouth daily. 04/04/21  Yes Donato Heinz, MD  Iron, Ferrous Sulfate, 325 (65 Fe) MG TABS Take 325 mg by mouth once for 1 dose. Patient not taking: Reported on 11/09/2021 05/03/21  05/03/21  [name], [name], NP   No Known Allergies Review of Systems Denies pain, admits to generalized weakness.   Physical Exam Resting comfortably Awakens appropriately, does not verbalize much Does not appear to be in acute distress Has edema lower extremities Abdomen is not distended Shallow regular respirations S1-S2  Vital Signs: BP (!) 192/86    Pulse 64    Temp (!) 97.2 F (36.2 C) (Axillary)    Resp 15    Ht 5\' 5"  (1.651 m)    Wt 81 kg Comment: 1 pillow, 1 sheet, 1 blanket   SpO2 100%    BMI 29.72 kg/m  Pain Scale: 0-10   Pain Score: 0-No pain   SpO2: SpO2: 100 % O2 Device:SpO2: 100 % O2 Flow Rate: .   IO: Intake/output summary:  Intake/Output Summary (Last 24 hours) at 11/18/2021 1442 Last data filed at 11/18/2021 1200 Gross per 24 hour  Intake 1099.24 ml  Output 925 ml  Net 174.24 ml    LBM: Last BM Date: 11/17/21 Baseline Weight: Weight: 79.8 kg Most recent weight: Weight: 81 kg (1 pillow, 1 sheet, 1 blanket)     Palliative Assessment/Data:   PPS 30%  Time In:  1530 Time Out:  1430 Time Total:  60  Greater than 50%  of this time was spent counseling and coordinating care related to the above assessment and plan.  Signed by: Loistine Chance, MD   Please contact Palliative Medicine Team phone at [phone] for questions and concerns.  For individual provider: See Shea Evans

## 2021-11-18 NOTE — TOC Progression Note (Signed)
Transition of Care [facility]) - Progression Note    Patient Details  Name: Kara Mullins MRN: [id number] Date of Birth: 1951/09/29  [facility]) CM/SW Contact  Ross Ludwig, [place] Phone Number: 11/18/2021, 4:11 PM  Clinical Narrative:     CSW was informed that patient has been switched to comfort measures.  CSW to continue to follow in case patient improves enough for hospice facility placement.  Expected Discharge Plan: [place] Barriers to Discharge: No Barriers Identified  Expected Discharge Plan and Services Expected Discharge Plan: [place], [place] Living arrangements for the past 2 months: Single Family Home                                       Social Determinants of Health (SDOH) Interventions    Readmission Risk Interventions No flowsheet data found.

## 2021-11-18 NOTE — Progress Notes (Signed)
Patient ID: Kara Mullins, female   DOB: April 21, 1951, 71 y.o.   MRN: [id number] [facility] Progress Note   Assessment/ Plan:   1. Acute kidney Injury on chronic kidney disease stage IIIb: Baseline creatinine ranging 1.3-1.6 in mid 2022 with current acute kidney injury that is suspected to be likely from ATN but cannot entirely rule out kidney injury associated with plasma cell dyscrasia.  Marginal urine output at 925 cc overnight with some improvement of creatinine seen on labs this morning.  No acute electrolyte abnormality or significant volume overload to prompt intervention.  Had a discussion with her daughter at bedside regarding her general health/decline and poor candidacy for dialysis-encouraged continued talks with palliative care that were initiated yesterday by Dr. Sloan Leiter. 2.  Non-Anion gap metabolic acidosis: Secondary to acute kidney injury superimposed on chronic kidney disease and likely plasma cell dyscrasia.  Continue sodium bicarbonate 650 mg 3 times a day. 3.  Pancytopenia: Likely associated with sepsis but cannot entirely rule out the impact of plasma cell dyscrasia on this process.  She remains anemic but with continued improvement of leukopenia and thrombocytopenia.  She is status post broad-spectrum intravenous antibiotics. 4.  Hypertension: Blood pressures intermittently elevated, continue to avoid RAAS blockade in the setting of acute kidney injury. 5.  History of combined systolic/diastolic congestive heart failure: With some seemingly chronic pedal edema but without any respiratory findings.  Subjective:   A little less interactive this afternoon compared to what she was this morning and yesterday.  Apparently has waxing and waning mental status   Objective:   BP (!) 196/81    Pulse 61    Temp (!) 97.2 F (36.2 C) (Axillary)    Resp 15    Ht 5\' 5"  (1.651 m)    Wt 81 kg Comment: 1 pillow, 1 sheet, 1 blanket   SpO2 99%    BMI 29.72 kg/m   Intake/Output  Summary (Last 24 hours) at 11/18/2021 1033 Last data filed at 11/18/2021 0900 Gross per 24 hour  Intake 1190.87 ml  Output 925 ml  Net 265.87 ml   Weight change:   Physical Exam: Gen: Resting comfortably in bed, appears to try to respond to questions but largely nonverbal CVS: Pulse regular rhythm, normal rate, S1 and S2 normal Resp: Anteriorly clear to auscultation bilaterally, no rales/rhonchi Abd: Soft, obese, nontender, bowel sounds normal Ext: 1+ dependent edema, no pitting edema over lower extremities.  Imaging: No results found.  Labs: BMET Recent Labs  Lab 11/12/21 0254 11/13/21 0300 11/14/21 0233 11/15/21 0250 11/16/21 0246 11/17/21 0250  NA 139 140 142 143 143 140  K 3.8 3.6 3.7 3.5 3.5 3.6  CL 114* 115* 119* 121* 119* 115*  CO2 17* 17* 16* 17* 17* 18*  GLUCOSE 57* 95 132* 120* 126* 169*  BUN 69* 72* 68* 65* 63* 63*  CREATININE 4.29* 4.50* 4.54* 4.49* 4.53* 4.39*  CALCIUM 7.0* 7.1* 7.1* 7.2* 7.4* 7.2*  PHOS  --   --   --  6.4*  --   --    CBC Recent Labs  Lab 11/12/21 0254 11/13/21 0300 11/14/21 0233  WBC 3.7* 3.9* 4.6  HGB 8.2* 8.8* 8.1*  HCT 26.3* 27.3* 26.4*  MCV 86.5 84.0 87.4  PLT 61* 86* 93*    Medications:     bacitracin-polymyxin b   Left Eye QHS   Chlorhexidine Gluconate Cloth  6 each Topical Daily   feeding supplement  237 mL Oral BID BM   mouth rinse  15 mL Mouth Rinse BID   multivitamin with minerals  1 tablet Oral Daily   rosuvastatin  10 mg Oral Daily   sodium bicarbonate  650 mg Oral TID   Elmarie Shiley, MD 11/18/2021, 10:33 AM

## 2021-11-18 NOTE — Progress Notes (Signed)
PROGRESS NOTE    Kara Mullins  CRF:[id number] DOB: Jul 13, 1951 DOA: 11/08/2021 PCP: [name], NP    Brief Narrative:  71 y.o. female with medical history significant of chronic combined systolic and diastolic heart failure, generalized anxiety disorder, hyperlipidemia, hypertension, stage IIIb CKD who was brought to the emergency department due to inability to swallow less than an hour before arrival. While in ED, pt had episode of syncope, found to be hypothermic and evidence suggesting UTI. Pt was admitted for further work up. Found to have AKI and UTI as well.   Assessment & Plan:   Principal Problem:   Hypothermia Active Problems:   Essential hypertension   AKI (acute kidney injury) ([place])   Hyperlipidemia   Chronic combined systolic and diastolic congestive heart failure (HCC)   Stage 3b chronic kidney disease (CKD) (HCC)   Pancytopenia (HCC)   Severe protein-calorie malnutrition (HCC)  Principal Problem: Severe sepsis with UTI present on admission: Presented with hypothermia, pancytopenia, ARF, toxic metabolic encephalopathy Urine cx pos for enterobacter, now on meropenem. Blood cultures negative. Urine cultures with Enterobacter. Sepsis physiology improving.  Patient is still with low temperature.   She declines labs today.  We will continue meropenem while in the hospital for total 10 days for complicated UTI.     Active Problems: Dysphagia/dysarthria/acute metabolic encephalopathy. MRI of the brain was negative for any acute stroke.  This is likely due to metabolic encephalopathy.  Fluctuating mental status.  See goal of care below.     Essential hypertension Holding diuretics given ARF Coreg now on hold, per below Cont to monitor BP and heart rate. On PRN hydralazine     AKI (acute kidney injury) ([place]) Superimposed on stage 3b chronic kidney disease (CKD) (HCC) Gentle IV fluid.  Currently remains on dextrose normal saline 50 mL/h. Renal US reviewed.   Chronic bilateral hydronephrosis. Patient had adequate urine output last 24 hours, recorded 900 mL.  No evidence of uremia.  Creatinine stabilizing.  Followed by nephrology.  Not a good candidate for dialysis.  Await renal recovery. Foley catheter is indicated because of acute renal failure and multiple episodes of urinary retention.  Continue until stabilization or patient able to get out of the bed to the commode. She declined labs today.   Hyperlipidemia Continue rosuvastatin 10 mg p.o. daily.     Chronic combined systolic and diastolic congestive heart failure (HCC) No signs of decompensation. CXR with evidence of vascular congestion and cardiomegaly Holding furosemide, losartan and spironolactone given ARF On minimal O2     Severe protein-calorie malnutrition (HCC) Protein supplementation. Nutritional services evaluation.  Sinus Bradycardia -HR recently noted to be as low as the 30's, asymptomatic -Discussed with Cardiology who recommends holding coreg -HR has improved off coreg   Advance care planning/goals of care: 2/4,Met with patient's husband and their daughter at the bedside.  Patient has been in and out of drowsiness.  Today her vitals are stable, however remains more lethargic.  Temperature has improved.  Discussed about difficult to treat conditions, patient's inability to remain wakefulness.  Unknown outcome of the renal functions.  Family understands sickness.  Patient has also mentioned many times that she is ready to go to heaven. 2/5, patient refused labs and also refused many medications.  No renal functions available today. Patient does not look like she is in any discomfort or pain.  She is simply tired and wants to go to heaven. Met with patient's daughter and son-in-law at the bedside.  Updated.  We have changed her status to no CPR. Patient is not in any discomfort now, however looks like she may benefit with palliative or hospice discussion.  Palliative team is  consulted.    DVT prophylaxis: SCD's Code Status: Full Family Communication: Daughter and son-in-law at the bedside.  Status is: Inpatient  Remains inpatient appropriate because: Severity of illness, on IV fluids.  Awaiting renal function recovery.    Consultants:  Nephrology Palliative  Procedures:  None.  Antimicrobials: Anti-infectives (From admission, onward)    Start     Dose/Rate Route Frequency Ordered Stop   11/14/21 2200  meropenem (MERREM) 500 mg in sodium chloride 0.9 % 100 mL IVPB        500 mg 200 mL/hr over 30 Minutes Intravenous Every 12 hours 11/14/21 1332     11/13/21 1300  meropenem (MERREM) 1 g in sodium chloride 0.9 % 100 mL IVPB  Status:  Discontinued        1 g 200 mL/hr over 30 Minutes Intravenous Every 12 hours 11/13/21 1151 11/14/21 1332   11/09/21 2200  ceFEPIme (MAXIPIME) 2 g in sodium chloride 0.9 % 100 mL IVPB  Status:  Discontinued        2 g 200 mL/hr over 30 Minutes Intravenous Every 24 hours 11/09/21 0323 11/13/21 1148   11/09/21 0322  vancomycin variable dose per unstable renal function (pharmacist dosing)  Status:  Discontinued         Does not apply See admin instructions 11/09/21 0323 11/09/21 1355   11/09/21 0215  ceFEPIme (MAXIPIME) 2 g in sodium chloride 0.9 % 100 mL IVPB        2 g 200 mL/hr over 30 Minutes Intravenous  Once 11/09/21 0210 11/09/21 0302   11/09/21 0215  metroNIDAZOLE (FLAGYL) IVPB 500 mg  Status:  Discontinued        500 mg 100 mL/hr over 60 Minutes Intravenous Every 12 hours 11/09/21 0210 11/12/21 1128   11/09/21 0215  vancomycin (VANCOCIN) IVPB 1000 mg/200 mL premix        1,000 mg 200 mL/hr over 60 Minutes Intravenous  Once 11/09/21 0210 11/09/21 0339       Subjective:  Patient seen and examined.  Woke up for conversation, she said okay to everything but remained sleepy.  Asked whether she is ready to get out of the bed, she tells me she will.  Nursing reported patient declined all medications last night.   They reported that she declined labs.  Objective: Vitals:   11/18/21 0700 11/18/21 0800 11/18/21 0806 11/18/21 0900  BP: (!) 204/95 (!) 166/78  (!) 196/81  Pulse:  61    Resp:  15    Temp:   (!) 97.2 F (36.2 C)   TempSrc:   Axillary   SpO2:      Weight:      Height:        Intake/Output Summary (Last 24 hours) at 11/18/2021 0927 Last data filed at 11/18/2021 0630 Gross per 24 hour  Intake 1065.96 ml  Output 925 ml  Net 140.96 ml   Filed Weights   11/09/21 0330 11/13/21 1240  Weight: 79.8 kg 81 kg    Examination:  General: Chronically sick looking.  Frail and debilitated.  On room air.  Not in any distress. Sleepy, wakes up to conversation but goes back to sleep.  Closes eyes.  Looks comfortable though.  On room air. Cardiovascular: S1-S2 normal.  Regular rate rhythm. Respiratory: Bilateral clear.  No added sounds.  Gastrointestinal: Soft.  Nontender.  Bowel sounds present. Ext: Chronic lymphedema bilateral lower extremities.  2+ edema on the dorsum of the feet. Neuro: Patient is alert and awake on stimulation.  Otherwise she sleeps most of the time.  Data Reviewed: I have personally reviewed following labs and imaging studies  CBC: Recent Labs  Lab 11/12/21 0254 11/13/21 0300 11/14/21 0233  WBC 3.7* 3.9* 4.6  HGB 8.2* 8.8* 8.1*  HCT 26.3* 27.3* 26.4*  MCV 86.5 84.0 87.4  PLT 61* 86* 93*   Basic Metabolic Panel: Recent Labs  Lab 11/13/21 0300 11/14/21 0233 11/15/21 0250 11/16/21 0246 11/17/21 0250  NA 140 142 143 143 140  K 3.6 3.7 3.5 3.5 3.6  CL 115* 119* 121* 119* 115*  CO2 17* 16* 17* 17* 18*  GLUCOSE 95 132* 120* 126* 169*  BUN 72* 68* 65* 63* 63*  CREATININE 4.50* 4.54* 4.49* 4.53* 4.39*  CALCIUM 7.1* 7.1* 7.2* 7.4* 7.2*  PHOS  --   --  6.4*  --   --    GFR: Estimated Creatinine Clearance: 12.7 mL/min (A) (by C-G formula based on SCr of 4.39 mg/dL (H)). Liver Function Tests: Recent Labs  Lab 11/12/21 0254 11/13/21 0300 11/14/21 0233  11/15/21 0250  AST _0 --   ALT _1 --   ALKPHOS 110 111 107  --   BILITOT 0.7 0.5 0.7  --   PROT 5.1* 5.5* 4.7*  --   ALBUMIN 2.0* 1.9* 1.7* 1.8*   No results for input(s): LIPASE, AMYLASE in the last 168 hours.  No results for input(s): AMMONIA in the last 168 hours. Coagulation Profile: No results for input(s): INR, PROTIME in the last 168 hours. Cardiac Enzymes: No results for input(s): CKTOTAL, CKMB, CKMBINDEX, TROPONINI in the last 168 hours. BNP (last 3 results) No results for input(s): PROBNP in the last 8760 hours. HbA1C: No results for input(s): HGBA1C in the last 72 hours. CBG: Recent Labs  Lab 11/17/21 1543 11/17/21 1924 11/17/21 2357 11/18/21 0343 11/18/21 0738  GLUCAP 104* 100* 122* 152* 135*   Lipid Profile: No results for input(s): CHOL, HDL, LDLCALC, TRIG, CHOLHDL, LDLDIRECT in the last 72 hours. Thyroid Function Tests: No results for input(s): TSH, T4TOTAL, FREET4, T3FREE, THYROIDAB in the last 72 hours.  Anemia Panel: No results for input(s): VITAMINB12, FOLATE, FERRITIN, TIBC, IRON, RETICCTPCT in the last 72 hours. Sepsis Labs: Recent Labs  Lab 11/11/21 1414 11/12/21 0254 11/13/21 0300  PROCALCITON 0.29 0.27 0.24    Recent Results (from the past 240 hour(s))  Resp Panel by RT-PCR (Flu A&B, Covid) Nasopharyngeal Swab     Status: None   Collection Time: 11/09/21 12:13 AM   Specimen: Nasopharyngeal Swab; Nasopharyngeal(NP) swabs in vial transport medium  Result Value Ref Range Status   SARS Coronavirus 2 by RT PCR NEGATIVE NEGATIVE Final    Comment: (NOTE) SARS-CoV-2 target nucleic acids are NOT DETECTED.  The SARS-CoV-2 RNA is generally detectable in upper respiratory specimens during the acute phase of infection. The lowest concentration of SARS-CoV-2 viral copies this assay can detect is 138 copies/mL. A negative result does not preclude SARS-Cov-2 infection and should not be used as the sole basis for treatment or other  patient management decisions. A negative result may occur with  improper specimen collection/handling, submission of specimen other than nasopharyngeal swab, presence of viral mutation(s) within the areas targeted by this assay, and inadequate number of viral copies(<138 copies/mL). A negative result must be combined with  clinical observations, patient history, and epidemiological information. The expected result is Negative.  Fact Sheet for Patients:  EntrepreneurPulse.com.au  Fact Sheet for Healthcare Providers:  IncredibleEmployment.be  This test is no t yet approved or cleared by the [place] FDA and  has been authorized for detection and/or diagnosis of SARS-CoV-2 by FDA under an Emergency Use Authorization (EUA). This EUA will remain  in effect (meaning this test can be used) for the duration of the COVID-19 declaration under Section 564(b)(1) of the Act, 21 U.S.C.section 360bbb-3(b)(1), unless the authorization is terminated  or revoked sooner.       Influenza A by PCR NEGATIVE NEGATIVE Final   Influenza B by PCR NEGATIVE NEGATIVE Final    Comment: (NOTE) The Xpert Xpress SARS-CoV-2/FLU/RSV plus assay is intended as an aid in the diagnosis of influenza from Nasopharyngeal swab specimens and should not be used as a sole basis for treatment. Nasal washings and aspirates are unacceptable for Xpert Xpress SARS-CoV-2/FLU/RSV testing.  Fact Sheet for Patients: EntrepreneurPulse.com.au  Fact Sheet for Healthcare Providers: IncredibleEmployment.be  This test is not yet approved or cleared by the [place] FDA and has been authorized for detection and/or diagnosis of SARS-CoV-2 by FDA under an Emergency Use Authorization (EUA). This EUA will remain in effect (meaning this test can be used) for the duration of the COVID-19 declaration under Section 564(b)(1) of the Act, 21 U.S.C. section  360bbb-3(b)(1), unless the authorization is terminated or revoked.  Performed at [facility], [place] [address] [place], [place] [address]   MRSA Next Gen by PCR, Nasal     Status: None   Collection Time: 11/09/21  2:29 AM   Specimen: Nasal Mucosa; Nasal Swab  Result Value Ref Range Status   MRSA by PCR Next Gen NOT DETECTED NOT DETECTED Final    Comment: (NOTE) The GeneXpert MRSA Assay (FDA approved for NASAL specimens only), is one component of a comprehensive MRSA colonization surveillance program. It is not intended to diagnose MRSA infection nor to guide or monitor treatment for MRSA infections. Test performance is not FDA approved in patients less than 24 years old. Performed at [facility], [place] [address] [place], [place] [address]   Urine Culture     Status: Abnormal   Collection Time: 11/09/21  9:44 AM   Specimen: Urine, Clean Catch  Result Value Ref Range Status   Specimen Description   Final    URINE, CLEAN CATCH Performed at [facility], [place] [address], [place], [place] [address]    Special Requests   Final    NONE Performed at [facility], [place] [address] [place], [place] [address]    Culture (A)  Final    <10,000 COLONIES/mL INSIGNIFICANT GROWTH Performed at [place] [address], [place], [place] [address]    Report Status 11/10/2021 FINAL  Final  C Difficile Quick Screen w PCR reflex     Status: None   Collection Time: 11/10/21 11:08 AM   Specimen: STOOL  Result Value Ref Range Status   C Diff antigen NEGATIVE NEGATIVE Final   C Diff toxin NEGATIVE NEGATIVE Final   C Diff interpretation No C. difficile detected.  Final    Comment: Performed at [facility], [place] [address], [place], [place] [address]  Urine Culture     Status: Abnormal   Collection Time: 11/10/21  6:07 PM   Specimen: Urine, Catheterized  Result Value Ref Range Status  Specimen  Description   Final    URINE, CATHETERIZED Performed at [facility], [place] [address] [place], [place] [address]    Special Requests   Final    NONE Performed at [facility], [place] [address], [place], [place] [address]    Culture 80,000 COLONIES/mL ENTEROBACTER CLOACAE (A)  Final   Report Status 11/13/2021 FINAL  Final   Organism ID, Bacteria ENTEROBACTER CLOACAE (A)  Final      Susceptibility   Enterobacter cloacae - MIC*    CEFAZOLIN >=64 RESISTANT Resistant     CIPROFLOXACIN <=0.25 SENSITIVE Sensitive     GENTAMICIN <=1 SENSITIVE Sensitive     IMIPENEM 0.5 SENSITIVE Sensitive     NITROFURANTOIN 64 INTERMEDIATE Intermediate     TRIMETH/SULFA <=20 SENSITIVE Sensitive     * 80,000 COLONIES/mL ENTEROBACTER CLOACAE  Culture, blood (routine x 2)     Status: None   Collection Time: 11/11/21  8:17 AM   Specimen: BLOOD  Result Value Ref Range Status   Specimen Description   Final    BLOOD BLOOD RIGHT HAND Performed at [place] [address] [place], [place] [address]    Special Requests   Final    BOTTLES DRAWN AEROBIC ONLY Blood Culture adequate volume Performed at [place] [address] [place], [place] [address]    Culture   Final    NO GROWTH 5 DAYS Performed at [facility], [place] [address] [place], [place] [address]    Report Status 11/16/2021 FINAL  Final  Culture, blood (routine x 2)     Status: None   Collection Time: 11/11/21  8:17 AM   Specimen: BLOOD  Result Value Ref Range Status   Specimen Description   Final    BLOOD BLOOD RIGHT HAND Performed at [place] [address] [place], [place] [address]    Special Requests   Final    BOTTLES DRAWN AEROBIC ONLY Blood Culture adequate volume Performed at [place] [address], [place], [place] [address]    Culture   Final    NO GROWTH 5 DAYS Performed at [facility], [place] [address], [place], [place] [address]    Report Status 11/16/2021 FINAL  Final      Radiology Studies: No results found.  Scheduled Meds:  bacitracin-polymyxin b   Left Eye QHS   Chlorhexidine Gluconate Cloth  6 each Topical Daily   feeding supplement  237 mL Oral BID BM   mouth rinse  15 mL Mouth Rinse BID   multivitamin with minerals  1 tablet Oral Daily   rosuvastatin  10 mg Oral Daily   sodium bicarbonate  650 mg Oral TID   Continuous Infusions:  sodium chloride Stopped (11/17/21 1145)   dextrose 5 % and 0.9% NaCl 50 mL/hr at 11/18/21 0059   meropenem (MERREM) IV Stopped (11/17/21 1053)     LOS: 9 days   Total time spent: 35 minutes  Barb Merino, MD [facility]   If 7PM-7AM, please contact night-coverage 11/18/2021, 9:27 AM

## 2021-11-18 NOTE — Plan of Care (Signed)
  Problem: Pain Managment: Goal: General experience of comfort will improve Outcome: Progressing   Problem: Safety: Goal: Ability to remain free from injury will improve Outcome: Progressing   

## 2021-11-18 NOTE — Progress Notes (Signed)
Patient refused labs and meds overnight. Patient still declining all meds and antibiotics. Patient only allowed IV fluids to be continued. Palliative care has been consulted.

## 2021-11-19 DIAGNOSIS — I10 Essential (primary) hypertension: Secondary | ICD-10-CM

## 2021-11-19 DIAGNOSIS — A419 Sepsis, unspecified organism: Secondary | ICD-10-CM | POA: Diagnosis present

## 2021-11-19 DIAGNOSIS — E785 Hyperlipidemia, unspecified: Secondary | ICD-10-CM

## 2021-11-19 LAB — BASIC METABOLIC PANEL
Anion gap: 6 (ref 5–15)
BUN: 62 mg/dL — ABNORMAL HIGH (ref 8–23)
CO2: 20 mmol/L — ABNORMAL LOW (ref 22–32)
Calcium: 7.7 mg/dL — ABNORMAL LOW (ref 8.9–10.3)
Chloride: 118 mmol/L — ABNORMAL HIGH (ref 98–111)
Creatinine, Ser: 4.55 mg/dL — ABNORMAL HIGH (ref 0.44–1.00)
GFR, Estimated: 10 mL/min — ABNORMAL LOW (ref >60)
Glucose, Bld: 125 mg/dL — ABNORMAL HIGH (ref 70–99)
Potassium: 3.9 mmol/L (ref 3.5–5.1)
Sodium: 144 mmol/L (ref 135–145)

## 2021-11-19 LAB — CBC
HCT: 28.8 % — ABNORMAL LOW (ref 36.0–46.0)
Hemoglobin: 8.8 g/dL — ABNORMAL LOW (ref 12.0–15.0)
MCH: 27.1 pg (ref 26.0–34.0)
MCHC: 30.6 g/dL (ref 30.0–36.0)
MCV: 88.6 fL (ref 80.0–100.0)
Platelets: 193 10*3/uL (ref 150–400)
RBC: 3.25 MIL/uL — ABNORMAL LOW (ref 3.87–5.11)
RDW: 17.9 % — ABNORMAL HIGH (ref 11.5–15.5)
WBC: 5.9 10*3/uL (ref 4.0–10.5)
nRBC: 0 % (ref 0.0–0.2)

## 2021-11-19 LAB — GLUCOSE, CAPILLARY: Glucose-Capillary: 151 mg/dL — ABNORMAL HIGH (ref 70–99)

## 2021-11-19 LAB — MAGNESIUM: Magnesium: 2.9 mg/dL — ABNORMAL HIGH (ref 1.7–2.4)

## 2021-11-19 MED ORDER — IRON (FERROUS SULFATE) 325 (65 FE) MG PO TABS: 325.0000 mg | ORAL_TABLET | Freq: Once | ORAL | Status: DC

## 2021-11-19 MED ORDER — CARVEDILOL 6.25 MG PO TABS
6.2500 mg | ORAL_TABLET | Freq: Two times a day (BID) | ORAL | Status: DC
  Administered 2021-11-19 – 2021-11-21 (×5): 6.25 mg via ORAL
  Filled 2021-11-19 (×6): qty 1

## 2021-11-19 NOTE — Assessment & Plan Note (Addendum)
Resolved at this time.  Secondary to UTI.  Completed 7-day course of meropenem during hospitalization.  Blood cultures were negative.

## 2021-11-19 NOTE — Telephone Encounter (Signed)
Patient has been seen in the hospital

## 2021-11-19 NOTE — Assessment & Plan Note (Addendum)
Baseline creatinine ranging from 1.3-1.6.  Has history of CKD stage IIIb.  Nephrology followed the patient during hospitalization.  Latest creatinine elevated at 4.3 from 4.7<4.5 <4.5.  We will continue to monitor BMP.  There is concern for plasma cell dyscrasia causing renal failure.  Patient is not a good candidate for hemodialysis or further aggressive intervention.  This was discussed with nephrology team during hospitalization.

## 2021-11-19 NOTE — Progress Notes (Signed)
Speech Language Pathology Treatment: Dysphagia  Patient Details Name: Jaquilla Woodroof MRN: [id number] DOB: [birth year] Today's Date: 11/19/2021 Time: [id number] SLP Time Calculation (min) (ACUTE ONLY): 25 min  Assessment / Plan / Recommendation Clinical Impression  Patient seen by SLP with daughter present in room, for dysphagia treatment and education. Of note, patient is no longer on comfort care and she is restarting her medications. Per daughter who was in the room, patient is much more alert. Patient asking various medical questions which were appropriate but that SLP had to defer patient to discuss with RN and/or MD (asking about leg swelling, fluid restriction, etc). Patient observed with consuming Dys 3 solids (soft cereal bar) taken initially with thin liquids. Patient exhibited mildly delayed cough response but with fairly quick recovery (in contrast, previous SLP note reported up to a 20 second amount of time needed to recover from coughing.) SLP had patient switch to nectar thick liquids and although she did initially exhibit a mildly delayed cough, frequency of cough response reduced as compared to thin liquids. Patient did have mildly prolonged mastication of soft solids however she is edentulous. SLP discussed with daughter and patient recommendation to upgrade solids to Dys 3, continue with nectar thick liquids with meals but allow for thin liquids only, in between meals. Swallow safety sign posted above bed and diet order updated as well. SLP will continue to follow patient to ensure diet toleration, determine need for objective swallow evaluation.    HPI HPI: pt is a 71 yo female adm to [facility] with CHF, UTI, CKD - and found to have hypothermia.  Pt with PMH + for COP, anxiety, HLD, HTN.  Pt CXR 11/14/2021 showed possible bibasilar pulmonary edema and small pleural effusios.   Per notes, pt was unable to swallow for one hour prior to admission.  RN reports pt coughing with thin liquids and SLP  swallow eval ordered.  Follow up for dysphagia management indicated due to concern for pt's dysphagia.      SLP Plan  Continue with current plan of care      Recommendations for follow up therapy are one component of a multi-disciplinary discharge planning process, led by the attending physician.  Recommendations may be updated based on patient status, additional functional criteria and insurance authorization.    Recommendations  Diet recommendations: Dysphagia 3 (mechanical soft);Nectar-thick liquid;Other(comment) (thin liquids only, in between meals) Liquids provided via: Cup;Straw;Teaspoon Medication Administration: Whole meds with puree Supervision: Full supervision/cueing for compensatory strategies Compensations: Slow rate;Small sips/bites Postural Changes and/or Swallow Maneuvers: Seated upright 90 degrees                Oral Care Recommendations: Oral care BID Follow Up Recommendations: Other (comment) (TBD) Assistance recommended at discharge: Intermittent Supervision/Assistance SLP Visit Diagnosis: Dysphagia, oral phase (R13.11);Dysphagia, unspecified (R13.10) Plan: Continue with current plan of care       Sonia Baller, MA, CCC-SLP Speech Therapy

## 2021-11-19 NOTE — Assessment & Plan Note (Addendum)
Coreg and low-dose hydralazine on discharge.  Hold her losartan and spironolactone.

## 2021-11-19 NOTE — Progress Notes (Signed)
PROGRESS NOTE    Kara Mullins  IRW:[id number] DOB: [birth year] DOA: 11/08/2021 PCP: [name], NP    Brief Narrative:  71 y.o. female with past medical history significant of chronic combined systolic and diastolic heart failure, generalized anxiety disorder, hyperlipidemia, hypertension, stage IIIb CKD was brought into the hospital with dysphagia. While in ED, patient had a stable vitals except for hypothermia but had an episode of syncope and urinalysis suggesting UTI.  Chest x-ray showed cardiomegaly.  CT head was unremarkable.  Patient was given IV fluids antibiotic and was admitted to the hospital for further evaluation and treatment.       Assessment and Plan: * Severe sepsis ([place])- (present on admission) Secondary to UTI.  Patient had hypothermia, pancytopenia acute kidney injury and metabolic encephalopathy on presentation.  Urine culture was positive for Enterobacter.  Patient is currently on meropenem.  Blood cultures have been negative so far.  Sepsis physiology improving.  Plan is to continue 10-day course of antibiotic.  Severe protein-calorie malnutrition ([place])- (present on admission) Continue nutritional support.  Pancytopenia ([place])- (present on admission) No recent labs.  We will check CBC in a.m.  Stage 3b chronic kidney disease (CKD) ([place])- (present on admission) Patient did have mild acute kidney injury on CKD stage IIIb.  Nephrology was also consulted.  Nonoliguric.  Not a good candidate for dialysis and nonuremic at this time.  On Foley catheter will continue for now.  Continue to monitor closely.   Chronic combined systolic and diastolic congestive heart failure ([place])- (present on admission) Has chronic pedal edema.  No decompensation at this time.  Chest x-ray without evidence of vascular congestion.  Lasix losartan spironolactone on hold due to acute kidney injury.  Hyperlipidemia- (present on admission) On Crestor.  Hypothermia- (present on  admission) Secondary to infection.  Improved at this time.  AKI (acute kidney injury) ([place])- (present on admission) Baseline creatinine ranging from 1.3-1.6.  Has history of CKD stage IIIb.  Latest creatinine elevated at 4.3 on 11/17/2021.  We will get BMP in AM.  Essential hypertension- (present on admission) Antihypertensives on hold at this time.  We will continue to monitor closely.  Ethics/goals of care. Patient was seen by palliative care during hospitalization.  At this time family wishes to continue with current level of care.  Was temporarily on comfort care.  Patient is still DNR   DVT prophylaxis: SCDs Start: 11/09/21 0733   Code Status:     Code Status: DNR  Disposition:  Status is: Inpatient Remains inpatient appropriate because: Renal failure, closer monitoring, will get stat labs today.   Family Communication:  Spoke with the patient's daughter at bedside.  Consultants:  Palliative care Nephrology  Procedures:  Urethral catheter insertion.  Antimicrobials:  Meropenem 11/13/21>  Anti-infectives (From admission, onward)    Start     Dose/Rate Route Frequency Ordered Stop   11/14/21 2200  meropenem (MERREM) 500 mg in sodium chloride 0.9 % 100 mL IVPB        500 mg 200 mL/hr over 30 Minutes Intravenous Every 12 hours 11/14/21 1332     11/13/21 1300  meropenem (MERREM) 1 g in sodium chloride 0.9 % 100 mL IVPB  Status:  Discontinued        1 g 200 mL/hr over 30 Minutes Intravenous Every 12 hours 11/13/21 1151 11/14/21 1332   11/09/21 2200  ceFEPIme (MAXIPIME) 2 g in sodium chloride 0.9 % 100 mL IVPB  Status:  Discontinued  2 g 200 mL/hr over 30 Minutes Intravenous Every 24 hours 11/09/21 0323 11/13/21 1148   11/09/21 0322  vancomycin variable dose per unstable renal function (pharmacist dosing)  Status:  Discontinued         Does not apply See admin instructions 11/09/21 0323 11/09/21 1355   11/09/21 0215  ceFEPIme (MAXIPIME) 2 g in sodium chloride 0.9 %  100 mL IVPB        2 g 200 mL/hr over 30 Minutes Intravenous  Once 11/09/21 0210 11/09/21 0302   11/09/21 0215  metroNIDAZOLE (FLAGYL) IVPB 500 mg  Status:  Discontinued        500 mg 100 mL/hr over 60 Minutes Intravenous Every 12 hours 11/09/21 0210 11/12/21 1128   11/09/21 0215  vancomycin (VANCOCIN) IVPB 1000 mg/200 mL premix        1,000 mg 200 mL/hr over 60 Minutes Intravenous  Once 11/09/21 0210 11/09/21 0339        Subjective: Today, patient was seen and examined at bedside.  Patient's daughter at bedside.  Patient states that that sleep is a little better today.  He was able to eat some grits.  No nausea vomiting or shortness of breath.  Objective: Vitals:   11/18/21 1500 11/18/21 1600 11/18/21 1630 11/18/21 1700  BP: (!) 191/75     Pulse:      Resp:  14 13 13   Temp:   (!) 97.2 F (36.2 C)   TempSrc:   Axillary   SpO2:      Weight:      Height:        Intake/Output Summary (Last 24 hours) at 11/19/2021 1509 Last data filed at 11/19/2021 8916 Gross per 24 hour  Intake 529.97 ml  Output 1000 ml  Net -470.03 ml   Filed Weights   11/09/21 0330 11/13/21 1240  Weight: 79.8 kg 81 kg   Body mass index is 29.72 kg/m.   Physical Examination:  General:  Average built, not in obvious distress, alert awake and communicative, HENT:   No scleral pallor or icterus noted. Oral mucosa is moist.  Chest:  Diminished breath sounds bilaterally. No crackles or wheezes.  CVS: S1 &S2 heard. No murmur.  Regular rate and rhythm. Abdomen: Soft, nontender, nondistended.  Bowel sounds are heard.  Foley catheter in place. Extremities: No cyanosis, clubbing or edema.  Peripheral pulses are palpable. Psych: Alert, awake and oriented to place.  Normal mood CNS:  No cranial nerve deficits.  Generalized weakness noted. Skin: Warm and dry.  No rashes noted.  Data Reviewed:   CBC: Recent Labs  Lab 11/13/21 0300 11/14/21 0233  WBC 3.9* 4.6  HGB 8.8* 8.1*  HCT 27.3* 26.4*  MCV 84.0  87.4  PLT 86* 93*    Basic Metabolic Panel: Recent Labs  Lab 11/13/21 0300 11/14/21 0233 11/15/21 0250 11/16/21 0246 11/17/21 0250  NA 140 142 143 143 140  K 3.6 3.7 3.5 3.5 3.6  CL 115* 119* 121* 119* 115*  CO2 17* 16* 17* 17* 18*  GLUCOSE 95 132* 120* 126* 169*  BUN 72* 68* 65* 63* 63*  CREATININE 4.50* 4.54* 4.49* 4.53* 4.39*  CALCIUM 7.1* 7.1* 7.2* 7.4* 7.2*  PHOS  --   --  6.4*  --   --     GFR: Estimated Creatinine Clearance: 12.7 mL/min (A) (by C-G formula based on SCr of 4.39 mg/dL (H)).  Liver Function Tests: Recent Labs  Lab 11/13/21 0300 11/14/21 0233 11/15/21 0250  AST 27 28  --  ALT 15 14  --   ALKPHOS 111 107  --   BILITOT 0.5 0.7  --   PROT 5.5* 4.7*  --   ALBUMIN 1.9* 1.7* 1.8*    CBG: Recent Labs  Lab 11/17/21 1543 11/17/21 1924 11/17/21 2357 11/18/21 0343 11/18/21 0738  GLUCAP 104* 100* 122* 152* 135*     Recent Results (from the past 240 hour(s))  C Difficile Quick Screen w PCR reflex     Status: None   Collection Time: 11/10/21 11:08 AM   Specimen: STOOL  Result Value Ref Range Status   C Diff antigen NEGATIVE NEGATIVE Final   C Diff toxin NEGATIVE NEGATIVE Final   C Diff interpretation No C. difficile detected.  Final    Comment: Performed at [facility], [place] [address], [place], [place] [address]  Urine Culture     Status: Abnormal   Collection Time: 11/10/21  6:07 PM   Specimen: Urine, Catheterized  Result Value Ref Range Status   Specimen Description   Final    URINE, CATHETERIZED Performed at [facility] [address], [place], [place] [address]    Special Requests   Final    NONE Performed at [facility], [place] [address] [place], [place] [address]    Culture 80,000 COLONIES/mL ENTEROBACTER CLOACAE (A)  Final   Report Status 11/13/2021 FINAL  Final   Organism ID, Bacteria ENTEROBACTER CLOACAE (A)  Final      Susceptibility   Enterobacter cloacae - MIC*     CEFAZOLIN >=64 RESISTANT Resistant     CIPROFLOXACIN <=0.25 SENSITIVE Sensitive     GENTAMICIN <=1 SENSITIVE Sensitive     IMIPENEM 0.5 SENSITIVE Sensitive     NITROFURANTOIN 64 INTERMEDIATE Intermediate     TRIMETH/SULFA <=20 SENSITIVE Sensitive     * 80,000 COLONIES/mL ENTEROBACTER CLOACAE  Culture, blood (routine x 2)     Status: None   Collection Time: 11/11/21  8:17 AM   Specimen: BLOOD  Result Value Ref Range Status   Specimen Description   Final    BLOOD BLOOD RIGHT HAND Performed at [facility] [address] [place], [place] [address]    Special Requests   Final    BOTTLES DRAWN AEROBIC ONLY Blood Culture adequate volume Performed at [facility] [address] [place], [place] [address]    Culture   Final    NO GROWTH 5 DAYS Performed at [facility], [place] [address], [place], [place] [address]    Report Status 11/16/2021 FINAL  Final  Culture, blood (routine x 2)     Status: None   Collection Time: 11/11/21  8:17 AM   Specimen: BLOOD  Result Value Ref Range Status   Specimen Description   Final    BLOOD BLOOD RIGHT HAND Performed at [facility] [address] [place], [place] [address]    Special Requests   Final    BOTTLES DRAWN AEROBIC ONLY Blood Culture adequate volume Performed at [facility] [address] [place], [place] [address]    Culture   Final    NO GROWTH 5 DAYS Performed at [facility], [place] [address], [place], [place] [address]    Report Status 11/16/2021 FINAL  Final      Radiology Studies: No results found.   Scheduled Meds:  bacitracin-polymyxin b   Left Eye QHS   Chlorhexidine Gluconate Cloth  6 each Topical  Daily   feeding supplement  237 mL Oral BID BM   mouth rinse  15 mL Mouth Rinse BID   multivitamin with minerals  1 tablet Oral Daily   rosuvastatin  10 mg Oral Daily   sodium bicarbonate  650 mg Oral TID   Continuous  Infusions:  sodium chloride Stopped (11/17/21 1145)   meropenem (MERREM) IV 500 mg (11/19/21 1051)     LOS: 10 days    Flora Lipps, MD [facility] 11/19/2021, 3:09 PM

## 2021-11-19 NOTE — Assessment & Plan Note (Addendum)
Nonuremic at this time.  Seen by nephrology during hospitalization.  Not a candidate for dialysis

## 2021-11-19 NOTE — Hospital Course (Addendum)
71 y.o. female with past medical history significant of chronic combined systolic and diastolic heart failure, generalized anxiety disorder, hyperlipidemia, hypertension, stage IIIb CKD was brought into the hospital with dysphagia. While in ED, patient had a stable vitals except for hypothermia but had an episode of syncope and urinalysis suggesting UTI.  Chest x-ray showed cardiomegaly.  CT head was unremarkable.  Patient was given IV fluids, antibiotic and was admitted to the hospital for further evaluation and treatment.    During hospitalization, patient was seen by nephrology.  Patient is a poor candidate for dialysis and due to general health decline, decision was not to proceed with further aggressive treatment.  At this time, patient is stable for disposition to skilled nursing facility.

## 2021-11-19 NOTE — Assessment & Plan Note (Addendum)
Improved.  Hemoglobin low at 7.6 but WBC 4.6 and platelet 210

## 2021-11-19 NOTE — Progress Notes (Signed)
Daily Progress Note   Patient Name: Kara Mullins       Date: 11/19/2021 DOB: July 08, 1951  Age: 71 y.o. MRN#: [id number] Attending Physician: Flora Lipps, MD Primary Care Physician: [name], NP Admit Date: 11/08/2021  Reason for Consultation/Follow-up: Establishing goals of care  Subjective:  Awake alert resting in bed Denies pain States that she wants to get better.   Length of Stay: 10  Current Medications: Scheduled Meds:   bacitracin-polymyxin b   Left Eye QHS   Chlorhexidine Gluconate Cloth  6 each Topical Daily   feeding supplement  237 mL Oral BID BM   mouth rinse  15 mL Mouth Rinse BID   multivitamin with minerals  1 tablet Oral Daily   rosuvastatin  10 mg Oral Daily   sodium bicarbonate  650 mg Oral TID    Continuous Infusions:  sodium chloride Stopped (11/17/21 1145)   meropenem (MERREM) IV 500 mg (11/19/21 1051)    PRN Meds: sodium chloride, acetaminophen **OR** acetaminophen, HYDROmorphone (DILAUDID) injection, lip balm, loratadine, ondansetron **OR** ondansetron (ZOFRAN) IV  Physical Exam         Elderly-appearing lady resting in bed No respiratory distress S1-S2 Trace edema No focal deficits Abdomen not tender  Vital Signs: BP (!) 191/75    Pulse 64    Temp (!) 97.2 F (36.2 C) (Axillary)    Resp 13    Ht 5' 5"  (1.651 m)    Wt 81 kg Comment: 1 pillow, 1 sheet, 1 blanket   SpO2 100%    BMI 29.72 kg/m  SpO2: SpO2: 100 % O2 Device: O2 Device: Room Air O2 Flow Rate:    Intake/output summary:  Intake/Output Summary (Last 24 hours) at 11/19/2021 1421 Last data filed at 11/19/2021 0355 Gross per 24 hour  Intake 579.94 ml  Output 1000 ml  Net -420.06 ml   LBM: Last BM Date: 11/18/21 Baseline Weight: Weight: 79.8 kg Most recent weight:  Weight: 81 kg (1 pillow, 1 sheet, 1 blanket)       Palliative Assessment/Data:      Patient Active Problem List   Diagnosis Date Noted   Severe sepsis ([place]) 11/19/2021   Hypothermia 11/09/2021   Hyperlipidemia 11/09/2021   Chronic combined systolic and diastolic congestive heart failure ([place]) 11/09/2021   Stage 3b chronic kidney disease (CKD) (  [place]) 11/09/2021   Pancytopenia ([place]) 11/09/2021   Severe protein-calorie malnutrition ([place]) 11/09/2021   Acute combined systolic and diastolic HF (heart failure) ([facility])    AKI (acute kidney injury) ([place])    CHF exacerbation ([place]) 03/06/2021   Essential hypertension    Hypertensive urgency 03/05/2021   Acute CHF (congestive heart failure) ([place]) 03/05/2021   Nausea and vomiting 03/05/2021   Elevated troponin level not due myocardial infarction 03/05/2021   Elevated d-dimer 03/05/2021   Generalized anxiety disorder 03/05/2021   Acute congestive heart failure ([place]) 03/05/2021    Palliative Care Assessment & Plan   Patient Profile:  71 year old lady with chronic combined systolic and diastolic heart failure, generalized anxiety disorder, dyslipidemia, hypertension, stage III chronic kidney disease brought into the emergency department because of difficulty swallowing, during evaluation in the emergency department was found to have episode of syncope, hypothermic, admitted for acute kidney injury and urinary tract infection. Patient was placed on antibiotics, was seen and evaluated by renal services. Patient began declining lab work and oral medications, requested for comfort care.  Patient expressed her wishes for not undergoing current scope of hospitalization.  Patient's care plan was reflected to comfort measures which expressed her wishes and palliative consult was placed.  Assessment: As per patient's previously expressed wishes, she was switched to comfort measures, transferred out of the ICU, medications were  discontinued at her request and patient was transferred to the fifth floor.  In a.m. on 11-19-2021, patient expressed a preference for discontinuing comfort measures and that she wished to have some medical treatment. I arrived at bedside on 12-09-2021 and another family meeting was held, see below.  Recommendations/Plan: Family meeting: I met with the patient, daughter son-in-law was at the bedside and other family member also at bedside. Patient stated that while she had wanted to die yesterday and wished to discontinue her medications and her management, she states today is another day.  She states that she feels more mentally clear today and wishes to resume medical treatment. Discussed frankly about compassionately with the patient as well as her family about her serious underlying conditions of chronic combined systolic and diastolic heart failure, chronic kidney disease.  Discussed that she remains at risk for ongoing decline and debility from these chronic irreversible conditions.  Hence, we discussed about differences between full code versus DO NOT RESUSCITATE.  After extensive discussions, patient wishes to remain DNR/DNI.  At end of life, she does not not want heroic aggressive measures. Patient however does want to discontinue comfort measures.  She wishes to resume some cardiac medications and do some home medications.  She wishes to participate with physical therapy and wants to see how she feels over the course of the next couple of days.  She states that she has a niece who visited with her last night and told her that she could get her walking again.  We discussed about gentle medical treatments and efforts at physical therapy.  Plan: Continue DNR/DNI Resume some home medications and cardiac medications as per [facility] MD, I have corresponded with him. Recommend physical therapy consultation Monitor oral intake and PT participation. Consider either SNF or rehab with palliative or home with  home-based physical therapy and outpatient palliative support on discharge. Palliative medicine team to continue to follow.     Code Status:    Code Status Orders  (From admission, onward)           Start     Ordered   11/17/21 1742  Do not attempt resuscitation (DNR)  Continuous       Question Answer Comment  In the event of cardiac or respiratory ARREST Do not call a code blue   In the event of cardiac or respiratory ARREST Do not perform Intubation, CPR, defibrillation or ACLS   In the event of cardiac or respiratory ARREST Use medication by any route, position, wound care, and other measures to relive pain and suffering. May use oxygen, suction and manual treatment of airway obstruction as needed for comfort.      11/17/21 1741           Code Status History     Date Active Date Inactive Code Status Order ID Comments User Context   11/09/2021 0734 11/17/2021 1741 Full Code [id number]  Reubin [place], MD Inpatient   03/05/2021 0735 03/15/2021 1931 Full Code [id number]  Vernelle Emerald, MD ED       Prognosis:  Unable to determine  Discharge Planning: To Be Determined  Care plan was discussed with  patient, daughter , son in law, other family member. Also discussed with RN and [facility] MD.   Thank you for allowing the Palliative Medicine Team to assist in the care of this patient.   Time In: 11 Time Out: 11.40 Total Time 40 Prolonged Time Billed No       Greater than 50%  of this time was spent counseling and coordinating care related to the above assessment and plan.  Loistine Chance, MD  Please contact Palliative Medicine Team phone at [phone] for questions and concerns.

## 2021-11-19 NOTE — Assessment & Plan Note (Addendum)
Has chronic pedal edema.  No decompensation at this time.  Chest x-ray without evidence of vascular congestion.  Lasix losartan spironolactone were on hold due to acute kidney injury.  We will continue to hold losartan and spironolactone on discharge but resume Lasix daily as needed for edema and weight gain.

## 2021-11-19 NOTE — Progress Notes (Signed)
Physical Therapy Treatment Patient Details Name: Kara Mullins MRN: [id number] DOB: Nov 12, 1950 Today's Date: 11/19/2021   History of Present Illness 71 y.o. female with medical history significant of chronic combined systolic and diastolic heart failure, generalized anxiety disorder, hyperlipidemia, hypertension, stage IIIb CKD who was brought to the emergency department due to inability to swallow less than an hour before arrival. While in ED, pt had episode of syncope, found to be hypothermic and evidence suggesting UTI. Dx of sepsis.    PT Comments    Patient making steady progress with mobility and increased ambulation distance to ~12' with Mod assist and RW. Assist provided to progress to step through pattern as pt needed assist to shift weight and maintain balance for increased foot clearance and step length. EOS pt resting in recliner and educated on exercises to facilitate circulation and address LE edema. She will benefit from [facility] rehab at SNF vs return home with hospice services pending pt/family decision. Acute PT will continue to progress as able.     Recommendations for follow up therapy are one component of a multi-disciplinary discharge planning process, led by the attending physician.  Recommendations may be updated based on patient status, additional functional criteria and insurance authorization.  Follow Up Recommendations  Skilled nursing-short term rehab (<3 hours/day)     Assistance Recommended at Discharge Frequent or constant Supervision/Assistance  Patient can return home with the following A lot of help with walking and/or transfers;A lot of help with bathing/dressing/bathroom;Assistance with cooking/housework;Assist for transportation;Help with stairs or ramp for entrance   Equipment Recommendations  Rolling walker (2 wheels)    Recommendations for Other Services       Precautions / Restrictions Precautions Precautions: Fall Precaution Comments: pt/daughter  report one loss of balance ~3 weeks ago, pt slid against a wall but didn't fall to floor. orthostatic standing on 2/2 Restrictions Weight Bearing Restrictions: No     Mobility  Bed Mobility Overal bed mobility: Needs Assistance Bed Mobility: Supine to Sit     Supine to sit: Min guard, HOB elevated, Min assist     General bed mobility comments: pt taking significantly extra time to pivot to EOB and using bed rail. pt required assist to scoot to EOB.    Transfers Overall transfer level: Needs assistance Equipment used: Rolling walker (2 wheels) Transfers: Sit to/from Stand Sit to Stand: Min assist, +2 safety/equipment, Mod assist           General transfer comment: Assist for power up from EOB to initaite and steady with rise. bil UE on RW for power up.    Ambulation/Gait Ambulation/Gait assistance: Mod assist Gait Distance (Feet): 12 Feet Assistive device: Rolling walker (2 wheels) Gait Pattern/deviations: Step-to pattern, Decreased step length - right, Decreased step length - left, Decreased stride length, Shuffle, Wide base of support, Step-through pattern Gait velocity: decr     General Gait Details: pt required mod assist to weight shift Rt/Lt to facilitate improved step length and foot clearance. Mod assist to steady balance as well and close chair follow for safety.   Stairs             Wheelchair Mobility    Modified Rankin (Stroke Patients Only)       Balance Overall balance assessment: Needs assistance Sitting-balance support: Feet supported Sitting balance-Leahy Scale: Fair     Standing balance support: Bilateral upper extremity supported, During functional activity, Reliant on assistive device for balance Standing balance-Leahy Scale: Poor Standing balance comment: reliant on walker and  external support of therapist.                            Cognition Arousal/Alertness: Awake/alert Behavior During Therapy: WFL for tasks  assessed/performed Overall Cognitive Status: Within Functional Limits for tasks assessed                                 General Comments: pt alert and oriented, not fully aware of medical situation.        Exercises      General Comments        Pertinent Vitals/Pain Pain Assessment Pain Assessment: No/denies pain    Home Living                          Prior Function            PT Goals (current goals can now be found in the care plan section) Acute Rehab PT Goals Patient Stated Goal: be able to walk at home PT Goal Formulation: With patient/family Time For Goal Achievement: 11/25/21 Potential to Achieve Goals: Good Progress towards PT goals: Progressing toward goals    Frequency    Min 2X/week      PT Plan Current plan remains appropriate    Co-evaluation              AM-PAC PT "6 Clicks" Mobility   Outcome Measure  Help needed turning from your back to your side while in a flat bed without using bedrails?: A Little Help needed moving from lying on your back to sitting on the side of a flat bed without using bedrails?: A Lot Help needed moving to and from a bed to a chair (including a wheelchair)?: A Lot Help needed standing up from a chair using your arms (e.g., wheelchair or bedside chair)?: A Lot Help needed to walk in hospital room?: A Lot Help needed climbing 3-5 steps with a railing? : Total 6 Click Score: 12    End of Session Equipment Utilized During Treatment: Gait belt Activity Tolerance: Patient tolerated treatment well Patient left: in chair;with call bell/phone within reach;with family/visitor present Nurse Communication: Mobility status PT Visit Diagnosis: Difficulty in walking, not elsewhere classified (R26.2)     Time: 8242-3536 PT Time Calculation (min) (ACUTE ONLY): 23 min  Charges:  $Gait Training: 8-22 mins $Therapeutic Activity: 8-22 mins                     Verner Mould, DPT Acute  Rehabilitation Services Office [phone] Pager [phone]    Jacques Navy 11/19/2021, 4:12 PM

## 2021-11-19 NOTE — Assessment & Plan Note (Signed)
On Crestor 

## 2021-11-19 NOTE — Care Management Important Message (Signed)
Important Message  Patient Details IM Letter placed in Patients room. Name: Kara Mullins MRN: [id number] Date of Birth: 1950/10/28   Medicare Important Message Given:  Yes     [name] 11/19/2021, 10:28 AM

## 2021-11-19 NOTE — Assessment & Plan Note (Addendum)
Secondary to infection.  Improved.

## 2021-11-19 NOTE — Assessment & Plan Note (Addendum)
Dysphagia 3 diet as per speech therapy.  Continue Ensure.

## 2021-11-19 NOTE — Plan of Care (Signed)
No acute events overnight. Pt denies pain. Problem: Pain Managment: Goal: General experience of comfort will improve Outcome: Progressing

## 2021-11-19 NOTE — Progress Notes (Signed)
Patient ID: Kara Mullins, female   DOB: 12/26/1950, 71 y.o.   MRN: [id number] [facility] Progress Note   Assessment/ Plan:   1. Acute kidney Injury on chronic kidney disease stage IIIb: Baseline creatinine ranging 1.3-1.6 in mid 2022 with current acute kidney injury that is suspected to be likely from ATN but cannot entirely rule out kidney injury associated with plasma cell dyscrasia.  No acute electrolyte abnormality or significant volume overload to prompt intervention.  We have had discussions with her daughter at bedside regarding her general health/decline and poor candidacy for dialysis. No further suggestions, will sign off.   2.  Non-Anion gap metabolic acidosis: Secondary to acute kidney injury superimposed on chronic kidney disease and likely plasma cell dyscrasia.  Continue sodium bicarbonate 650 mg 2-3 times a day for goal CO2 20- 26. 3.  Pancytopenia: Likely associated with sepsis but cannot entirely rule out the impact of plasma cell dyscrasia on this process.  She remains anemic but with continued improvement of leukopenia and thrombocytopenia.  She is status post broad-spectrum intravenous antibiotics. 4.  Hypertension: Blood pressures intermittently elevated, continue to avoid RAAS blockade in the setting of acute kidney injury. 5.  History of combined systolic/diastolic congestive heart failure: With some seemingly chronic pedal edema but without any respiratory findings.  Kelly Splinter, MD 11/19/2021, 6:35 PM     Subjective:   No c/o's   Objective:   BP (!) 191/75    Pulse 64    Temp (!) 97.2 F (36.2 C) (Axillary)    Resp 13    Ht 5\' 5"  (1.651 m)    Wt 81 kg Comment: 1 pillow, 1 sheet, 1 blanket   SpO2 100%    BMI 29.72 kg/m   Intake/Output Summary (Last 24 hours) at 11/19/2021 1833 Last data filed at 11/19/2021 1719 Gross per 24 hour  Intake 717 ml  Output 750 ml  Net -33 ml    Weight change:   Physical Exam: Gen: Resting comfortably in bed, appears  to try to respond to questions but largely nonverbal CVS: Pulse regular rhythm, normal rate, S1 and S2 normal Resp: Anteriorly clear to auscultation bilaterally, no rales/rhonchi Abd: Soft, obese, nontender, bowel sounds normal Ext: 1+ dependent edema, no pitting edema over lower extremities.  Imaging: No results found.  Labs: BMET Recent Labs  Lab 11/13/21 0300 11/14/21 0233 11/15/21 0250 11/16/21 0246 11/17/21 0250 11/19/21 1615  NA 140 142 143 143 140 144  K 3.6 3.7 3.5 3.5 3.6 3.9  CL 115* 119* 121* 119* 115* 118*  CO2 17* 16* 17* 17* 18* 20*  GLUCOSE 95 132* 120* 126* 169* 125*  BUN 72* 68* 65* 63* 63* 62*  CREATININE 4.50* 4.54* 4.49* 4.53* 4.39* 4.55*  CALCIUM 7.1* 7.1* 7.2* 7.4* 7.2* 7.7*  PHOS  --   --  6.4*  --   --   --     CBC Recent Labs  Lab 11/13/21 0300 11/14/21 0233 11/19/21 1615  WBC 3.9* 4.6 5.9  HGB 8.8* 8.1* 8.8*  HCT 27.3* 26.4* 28.8*  MCV 84.0 87.4 88.6  PLT 86* 93* 193     Medications:     bacitracin-polymyxin b   Left Eye QHS   carvedilol  6.25 mg Oral BID WC   Chlorhexidine Gluconate Cloth  6 each Topical Daily   feeding supplement  237 mL Oral BID BM   mouth rinse  15 mL Mouth Rinse BID   multivitamin with minerals  1 tablet Oral Daily  rosuvastatin  10 mg Oral Daily   sodium bicarbonate  650 mg Oral TID

## 2021-11-20 ENCOUNTER — Ambulatory Visit: Payer: Medicare (Managed Care) | Admitting: Cardiology

## 2021-11-20 DIAGNOSIS — Z7189 Other specified counseling: Secondary | ICD-10-CM

## 2021-11-20 DIAGNOSIS — R5381 Other malaise: Secondary | ICD-10-CM

## 2021-11-20 DIAGNOSIS — Z515 Encounter for palliative care: Secondary | ICD-10-CM

## 2021-11-20 DIAGNOSIS — R6520 Severe sepsis without septic shock: Secondary | ICD-10-CM

## 2021-11-20 DIAGNOSIS — E872 Acidosis, unspecified: Secondary | ICD-10-CM

## 2021-11-20 DIAGNOSIS — A419 Sepsis, unspecified organism: Principal | ICD-10-CM

## 2021-11-20 LAB — GLUCOSE, CAPILLARY
Glucose-Capillary: 110 mg/dL — ABNORMAL HIGH (ref 70–99)
Glucose-Capillary: 118 mg/dL — ABNORMAL HIGH (ref 70–99)
Glucose-Capillary: 128 mg/dL — ABNORMAL HIGH (ref 70–99)
Glucose-Capillary: 146 mg/dL — ABNORMAL HIGH (ref 70–99)
Glucose-Capillary: 146 mg/dL — ABNORMAL HIGH (ref 70–99)

## 2021-11-20 LAB — CBC
HCT: 25.3 % — ABNORMAL LOW (ref 36.0–46.0)
Hemoglobin: 7.8 g/dL — ABNORMAL LOW (ref 12.0–15.0)
MCH: 27.8 pg (ref 26.0–34.0)
MCHC: 30.8 g/dL (ref 30.0–36.0)
MCV: 90 fL (ref 80.0–100.0)
Platelets: 170 10*3/uL (ref 150–400)
RBC: 2.81 MIL/uL — ABNORMAL LOW (ref 3.87–5.11)
RDW: 17.5 % — ABNORMAL HIGH (ref 11.5–15.5)
WBC: 4.9 10*3/uL (ref 4.0–10.5)
nRBC: 0 % (ref 0.0–0.2)

## 2021-11-20 LAB — COMPREHENSIVE METABOLIC PANEL
ALT: 27 U/L (ref 0–44)
AST: 65 U/L — ABNORMAL HIGH (ref 15–41)
Albumin: 1.7 g/dL — ABNORMAL LOW (ref 3.5–5.0)
Alkaline Phosphatase: 144 U/L — ABNORMAL HIGH (ref 38–126)
Anion gap: 6 (ref 5–15)
BUN: 63 mg/dL — ABNORMAL HIGH (ref 8–23)
CO2: 18 mmol/L — ABNORMAL LOW (ref 22–32)
Calcium: 7.4 mg/dL — ABNORMAL LOW (ref 8.9–10.3)
Chloride: 119 mmol/L — ABNORMAL HIGH (ref 98–111)
Creatinine, Ser: 4.51 mg/dL — ABNORMAL HIGH (ref 0.44–1.00)
GFR, Estimated: 10 mL/min — ABNORMAL LOW (ref >60)
Glucose, Bld: 137 mg/dL — ABNORMAL HIGH (ref 70–99)
Potassium: 3.8 mmol/L (ref 3.5–5.1)
Sodium: 143 mmol/L (ref 135–145)
Total Bilirubin: 0.4 mg/dL (ref 0.3–1.2)
Total Protein: 4.7 g/dL — ABNORMAL LOW (ref 6.5–8.1)

## 2021-11-20 LAB — PHOSPHORUS: Phosphorus: 6.8 mg/dL — ABNORMAL HIGH (ref 2.5–4.6)

## 2021-11-20 LAB — MAGNESIUM: Magnesium: 2.8 mg/dL — ABNORMAL HIGH (ref 1.7–2.4)

## 2021-11-20 NOTE — Progress Notes (Signed)
PROGRESS NOTE    Kara Mullins  POE:[id number] DOB: 11-07-1950 DOA: 11/08/2021 PCP: [name], NP    Brief Narrative:  71 y.o. female with past medical history significant of chronic combined systolic and diastolic heart failure, generalized anxiety disorder, hyperlipidemia, hypertension, stage IIIb CKD was brought into the hospital with dysphagia. While in ED, patient had a stable vitals except for hypothermia but had an episode of syncope and urinalysis suggesting UTI.  Chest x-ray showed cardiomegaly.  CT head was unremarkable.  Patient was given IV fluids antibiotic and was admitted to the hospital for further evaluation and treatment.        Assessment and Plan: * Severe sepsis ([place])- (present on admission) Secondary to UTI.  Patient had hypothermia, pancytopenia acute kidney injury and metabolic encephalopathy on presentation.  Urine culture was positive for Enterobacter.  Patient is currently on meropenem.  Blood cultures have been negative so far.  Sepsis physiology improving.  Patient has received more than 7-day course of meropenem.  We will discontinue  AKI (acute kidney injury) ([place])- (present on admission) Baseline creatinine ranging from 1.3-1.6.  Has history of CKD stage IIIb.  Nephrology on board.  Latest creatinine elevated at 4.5 <4.5.  We will continue to monitor BMP.  Stage 3b chronic kidney disease (CKD) ([place])- (present on admission) Currently AKI on CKD stage IIIb. Nonuremic at this time.  On Foley catheter, will discontinue and do a voiding trial.   Metabolic acidosis None anion gap metabolic acidosis secondary to AKI on CKD.    Continue sodium bicarbonate as per nephrology.  [place] Physical therapy has seen the patient at this time and recommend skilled nursing facility placement on discharge  Severe protein-calorie malnutrition ([place])- (present on admission) Continue nutritional support.  Dysphagia 3 diet as per speech therapy.  Continue  Ensure.  Pancytopenia ([place])- (present on admission) Improved.  Latest CBC with normal white cells and platelet of 170.  Hemoglobin low at 7.8.  Chronic combined systolic and diastolic congestive heart failure ([place])- (present on admission) Has chronic pedal edema.  No decompensation at this time.  Chest x-ray without evidence of vascular congestion.  Lasix losartan spironolactone on hold due to acute kidney injury.  Hyperlipidemia- (present on admission) On Crestor.  Hypothermia- (present on admission) Secondary to infection.  Improved at this time.  Latest temperature of 98.6 F.  Essential hypertension- (present on admission) Antihypertensives on hold at this time.  We will continue to monitor closely.  Ethics/goals of care. Patient was seen by palliative care during hospitalization.  At this time family wishes to continue with current level of care.  Patient was temporarily on comfort care.  Patient is  DNR   DVT prophylaxis: SCDs Start: 11/09/21 0733   Code Status:     Code Status: DNR  Disposition: Skilled nursing facility with palliative care  Status is: Inpatient Remains inpatient appropriate because: Renal failure, closer monitoring, need for skilled nursing facility placement.  Family Communication:  Spoke with the patient's daughter at bedside.  Consultants:  Palliative care Nephrology  Procedures:  Urethral catheter insertion.  Antimicrobials:  Meropenem 11/13/21>  Anti-infectives (From admission, onward)    Start     Dose/Rate Route Frequency Ordered Stop   11/14/21 2200  meropenem (MERREM) 500 mg in sodium chloride 0.9 % 100 mL IVPB  Status:  Discontinued        500 mg 200 mL/hr over 30 Minutes Intravenous Every 12 hours 11/14/21 1332 11/20/21 1402   11/13/21 1300  meropenem (MERREM)  1 g in sodium chloride 0.9 % 100 mL IVPB  Status:  Discontinued        1 g 200 mL/hr over 30 Minutes Intravenous Every 12 hours 11/13/21 1151 11/14/21 1332   11/09/21 2200   ceFEPIme (MAXIPIME) 2 g in sodium chloride 0.9 % 100 mL IVPB  Status:  Discontinued        2 g 200 mL/hr over 30 Minutes Intravenous Every 24 hours 11/09/21 0323 11/13/21 1148   11/09/21 0322  vancomycin variable dose per unstable renal function (pharmacist dosing)  Status:  Discontinued         Does not apply See admin instructions 11/09/21 0323 11/09/21 1355   11/09/21 0215  ceFEPIme (MAXIPIME) 2 g in sodium chloride 0.9 % 100 mL IVPB        2 g 200 mL/hr over 30 Minutes Intravenous  Once 11/09/21 0210 11/09/21 0302   11/09/21 0215  metroNIDAZOLE (FLAGYL) IVPB 500 mg  Status:  Discontinued        500 mg 100 mL/hr over 60 Minutes Intravenous Every 12 hours 11/09/21 0210 11/12/21 1128   11/09/21 0215  vancomycin (VANCOCIN) IVPB 1000 mg/200 mL premix        1,000 mg 200 mL/hr over 60 Minutes Intravenous  Once 11/09/21 0210 11/09/21 0339        Subjective: Today, patient was seen and examined at bedside.  Patient's daughter at bedside.  Patient states that that sleep is a little better today.  He was able to eat some grits.  No nausea vomiting or shortness of breath.  Objective: Vitals:   11/18/21 1700 11/19/21 2004 11/20/21 0527 11/20/21 1214  BP:  (!) 157/74 (!) 158/69 106/89  Pulse:  64 61 (!) 54  Resp: 13 18 18 18   Temp:  98.6 F (37 C) 97.9 F (36.6 C) 98.6 F (37 C)  TempSrc:   Oral Oral  SpO2:  100% 100% 100%  Weight:      Height:        Intake/Output Summary (Last 24 hours) at 11/20/2021 1405 Last data filed at 11/20/2021 0900 Gross per 24 hour  Intake 717 ml  Output 700 ml  Net 17 ml   Filed Weights   11/09/21 0330 11/13/21 1240  Weight: 79.8 kg 81 kg   Body mass index is 29.72 kg/m.   Physical Examination:  General:  Average built, not in obvious distress HENT:   No scleral pallor or icterus noted. Oral mucosa is moist.  Chest:  Clear breath sounds.  Diminished breath sounds bilaterally. No crackles or wheezes.  CVS: S1 &S2 heard. No murmur.  Regular rate  and rhythm. Abdomen: Soft, nontender, nondistended.  Bowel sounds are heard.  Foley catheter in place. Extremities: No cyanosis, clubbing or edema.  Peripheral pulses are palpable. Psych: Alert, awake and oriented, normal mood CNS:  No cranial nerve deficits.  Generalized weakness noted. Skin: Warm and dry.  No rashes noted.   Data Reviewed:   CBC: Recent Labs  Lab 11/14/21 0233 11/19/21 1615 11/20/21 0510  WBC 4.6 5.9 4.9  HGB 8.1* 8.8* 7.8*  HCT 26.4* 28.8* 25.3*  MCV 87.4 88.6 90.0  PLT 93* 193 852    Basic Metabolic Panel: Recent Labs  Lab 11/15/21 0250 11/16/21 0246 11/17/21 0250 11/19/21 1615 11/20/21 0510  NA 143 143 140 144 143  K 3.5 3.5 3.6 3.9 3.8  CL 121* 119* 115* 118* 119*  CO2 17* 17* 18* 20* 18*  GLUCOSE 120* 126* 169*  125* 137*  BUN 65* 63* 63* 62* 63*  CREATININE 4.49* 4.53* 4.39* 4.55* 4.51*  CALCIUM 7.2* 7.4* 7.2* 7.7* 7.4*  MG  --   --   --  2.9* 2.8*  PHOS 6.4*  --   --   --  6.8*    GFR: Estimated Creatinine Clearance: 12.4 mL/min (A) (by C-G formula based on SCr of 4.51 mg/dL (H)).  Liver Function Tests: Recent Labs  Lab 11/14/21 0233 11/15/21 0250 11/20/21 0510  AST 28  --  65*  ALT 14  --  27  ALKPHOS 107  --  144*  BILITOT 0.7  --  0.4  PROT 4.7*  --  4.7*  ALBUMIN 1.7* 1.8* 1.7*    CBG: Recent Labs  Lab 11/18/21 0738 11/19/21 2006 11/20/21 0002 11/20/21 0743 11/20/21 1215  GLUCAP 135* 151* 146* 110* 146*     Recent Results (from the past 240 hour(s))  Urine Culture     Status: Abnormal   Collection Time: 11/10/21  6:07 PM   Specimen: Urine, Catheterized  Result Value Ref Range Status   Specimen Description   Final    URINE, CATHETERIZED Performed at [facility], [place] [address] [place], [place] [address]    Special Requests   Final    NONE Performed at [facility], [place] [address] [place], [place] [address]    Culture 80,000 COLONIES/mL ENTEROBACTER CLOACAE (A)   Final   Report Status 11/13/2021 FINAL  Final   Organism ID, Bacteria ENTEROBACTER CLOACAE (A)  Final      Susceptibility   Enterobacter cloacae - MIC*    CEFAZOLIN >=64 RESISTANT Resistant     CIPROFLOXACIN <=0.25 SENSITIVE Sensitive     GENTAMICIN <=1 SENSITIVE Sensitive     IMIPENEM 0.5 SENSITIVE Sensitive     NITROFURANTOIN 64 INTERMEDIATE Intermediate     TRIMETH/SULFA <=20 SENSITIVE Sensitive     * 80,000 COLONIES/mL ENTEROBACTER CLOACAE  Culture, blood (routine x 2)     Status: None   Collection Time: 11/11/21  8:17 AM   Specimen: BLOOD  Result Value Ref Range Status   Specimen Description   Final    BLOOD BLOOD RIGHT HAND Performed at [facility] [address], [place], [place] [address]    Special Requests   Final    BOTTLES DRAWN AEROBIC ONLY Blood Culture adequate volume Performed at [facility] [address], [place], [place] [address]    Culture   Final    NO GROWTH 5 DAYS Performed at [facility], [place] [address], [place], [place] [address]    Report Status 11/16/2021 FINAL  Final  Culture, blood (routine x 2)     Status: None   Collection Time: 11/11/21  8:17 AM   Specimen: BLOOD  Result Value Ref Range Status   Specimen Description   Final    BLOOD BLOOD RIGHT HAND Performed at [facility] [address] [place], [place] [address]    Special Requests   Final    BOTTLES DRAWN AEROBIC ONLY Blood Culture adequate volume Performed at [facility] [address], [place], [place] [address]    Culture   Final    NO GROWTH 5 DAYS Performed at [facility], [place] [address], [place], [place] [address]    Report Status 11/16/2021 FINAL  Final      Radiology Studies: No results found.   Scheduled Meds:  bacitracin-polymyxin b   Left Eye QHS   carvedilol  6.25 mg Oral BID WC   feeding supplement  237 mL Oral BID BM   mouth rinse  15 mL Mouth Rinse BID    multivitamin with minerals  1 tablet Oral Daily   rosuvastatin  10 mg Oral Daily   sodium bicarbonate  650 mg Oral TID   Continuous Infusions:  sodium chloride Stopped (11/17/21 1145)     LOS: 11 days    Flora Lipps, MD [facility] 11/20/2021, 2:05 PM

## 2021-11-20 NOTE — Progress Notes (Signed)
Nutrition Follow-up  INTERVENTION:   -Ensure Plus High Protein po BID, each supplement provides 350 kcal and 20 grams of protein.   -Multivitamin with minerals daily  NUTRITION DIAGNOSIS:   Inadequate oral intake related to dysphagia, decreased appetite, lethargy/confusion as evidenced by per patient/family report.  Ongoing.  GOAL:   Patient will meet greater than or equal to 90% of their needs  Progressing.  MONITOR:   PO intake, Supplement acceptance, Labs, Weight trends  ASSESSMENT:   71 y.o. female with medical history of CHF, anxiety, HLD, HTN, stage 3 CKD. She presented to the ED from home. Her daughter reported that patient reported inability to swallow and difficulty verbalizing. Her daughter reported that patient has been less interactive for the 1 week PTA. She was admitted for sepsis 2/2 UTI.  Patient consuming 75-100% of meals at this time. Accepting Ensure supplements as well.  SLP evaluated 2/6: recommended continue dysphagia 3 diet with nectar thick liquids.  Per chart review, as of 2/6: pt no longer comfort care. Some medical treatment resumed. Now DNR.  Admission weight: 175 lbs. Last weight 1/31: 178 lbs  Medications reviewed.  Labs reviewed: CBGs: 110-151 Elevated Phos (6.8), Mg (2.8)  Diet Order:   Diet Order             DIET DYS 3 Room service appropriate? Yes; Fluid consistency: Nectar Thick; Fluid restriction: 1200 mL Fluid  Diet effective now                   EDUCATION NEEDS:   Education needs have been addressed  Skin:  Skin Assessment: Skin Integrity Issues: Skin Integrity Issues:: Other (Comment) Other: MASD to sacrum  Last BM:  2/7 -type 7  Height:   Ht Readings from Last 1 Encounters:  11/09/21 5\' 5"  (1.651 m)    Weight:   Wt Readings from Last 1 Encounters:  11/13/21 81 kg    BMI:  Body mass index is 29.72 kg/m.  Estimated Nutritional Needs:   Kcal:  1900-2100 kcal  Protein:  95-110 grams  Fluid:   >/= 1.9 L/day  Kara Bibles, MS, RD, LDN Inpatient Clinical Dietitian Contact information available via Amion

## 2021-11-20 NOTE — Progress Notes (Signed)
°  Patient talked about her grief for loss of her older daughter that happened in 2020. Her older daughter passed away because of the knee surgery. Patient said that she misses her very much. Patient is planning a big party at her church in [place] of her older daughter so that she can send her memory off.  Not only loss of her daughter happened, but also around the same time she lost her job at a [employer] where she worked almost 30 years.  Patient said that when the drugs that the hospital gave her when she came in here was too strong, she had no strength, and patient said because of the strong drugs she was hallucinating (feeling like she sees something) and patient said, "now the doctors are giving me one at a time" because before, the drugs were too strong.   Patient also said she misses going to church very much. From the year 2020, the church shut down so she could not go worship. She only went back and forth from her house and work.   Patient thanked chaplain for listening to her and praying with her. We closed visit with the patient's prayer and chaplain's prayer.   Diamond Nickel

## 2021-11-20 NOTE — TOC Initial Note (Signed)
Transition of Care [facility]) - Initial/Assessment Note    Patient Details  Name: Kara Mullins MRN: [id number] Date of Birth: 08-01-1951  [facility]) CM/SW Contact:    Trish Mage, LCSW Phone Number: 11/20/2021, 2:24 PM  Clinical Narrative:   Patient seen in follow up to PT recommendation of SNF.  Found Kara Mullins in [place] visiting with her daughter.  She confirmed that she is interested in going to rehab, then returning home with her daughter.  She has a cane and rollator, and hopes to get back to ambulation at the same level as previously.  Bed search and insurance authorization explained.  Bed search initiated. TOC will continue to follow during the course of hospitalization.                 Expected Discharge Plan: Skilled Nursing Facility Barriers to Discharge: SNF Pending bed offer   Patient Goals and CMS Choice Patient states their goals for this hospitalization and ongoing recovery are:: go to rehab   Choice offered to / list presented to : Patient, Adult Children  Expected Discharge Plan and Services Expected Discharge Plan: [place]   Discharge Planning Services: CM Consult Post Acute Care Choice: [place] Living arrangements for the past 2 months: Single Family Home                                      Prior Living Arrangements/Services Living arrangements for the past 2 months: Single Family Home Lives with:: Adult Children Patient language and need for interpreter reviewed:: Yes        Need for Family Participation in Patient Care: Yes (Comment) Care giver support system in place?: Yes (comment) Current home services: DME Criminal Activity/Legal Involvement Pertinent to Current Situation/Hospitalization: No - Comment as needed  Activities of Daily Living Home Assistive Devices/Equipment: Cane (specify quad or straight), Walker (specify type) ADL Screening (condition at time of admission) Patient's  cognitive ability adequate to safely complete daily activities?: Yes Is the patient deaf or have difficulty hearing?: No Does the patient have difficulty seeing, even when wearing glasses/contacts?: No Does the patient have difficulty concentrating, remembering, or making decisions?: No Patient able to express need for assistance with ADLs?: Yes Does the patient have difficulty dressing or bathing?: No Independently performs ADLs?: Yes (appropriate for developmental age) Does the patient have difficulty walking or climbing stairs?: Yes Weakness of Legs: Both Weakness of Arms/Hands: Both  Permission Sought/Granted Permission sought to share information with : Family Supports Permission granted to share information with : Yes, Verbal Permission Granted  Share Information with NAME: Kara Mullins (Daughter)   [phone]           Emotional Assessment Appearance:: Appears stated age Attitude/Demeanor/Rapport: Engaged Affect (typically observed): Appropriate Orientation: : Oriented to Self, Oriented to Place, Oriented to Situation Alcohol / Substance Use: Not Applicable Psych Involvement: No (comment)  Admission diagnosis:  Hypothermia [T68.XXXA] Patient Active Problem List   Diagnosis Date Noted   Debility 70/62/3762   Metabolic acidosis 83/15/1761   Severe sepsis ([place]) 11/19/2021   Hypothermia 11/09/2021   Hyperlipidemia 11/09/2021   Chronic combined systolic and diastolic congestive heart failure ([place]) 11/09/2021   Stage 3b chronic kidney disease (CKD) ([place]) 11/09/2021   Pancytopenia ([place]) 11/09/2021   Severe protein-calorie malnutrition ([place]) 11/09/2021   Acute combined systolic and diastolic HF (heart failure) (HCC)    AKI (acute kidney  injury) ([place])    CHF exacerbation ([place]) 03/06/2021   Essential hypertension    Hypertensive urgency 03/05/2021   Acute CHF (congestive heart failure) ([place]) 03/05/2021   Nausea and vomiting 03/05/2021   Elevated troponin level not due  myocardial infarction 03/05/2021   Elevated d-dimer 03/05/2021   Generalized anxiety disorder 03/05/2021   Acute congestive heart failure ([place]) 03/05/2021   PCP:  [name], NP Pharmacy:   [facility] [place], [address] [place] [address] Phone: [phone] Fax: [phone]     Social Determinants of Health (SDOH) Interventions    Readmission Risk Interventions No flowsheet data found.

## 2021-11-20 NOTE — Assessment & Plan Note (Addendum)
Nonanion gap metabolic acidosis secondary to AKI on CKD and possible plasma cell dyscrasia.    Continue sodium bicarbonate as per nephrology.  Goal CO2 of 20-26

## 2021-11-20 NOTE — Assessment & Plan Note (Signed)
Physical therapy has seen the patient at this time and recommend skilled nursing facility placement on discharge

## 2021-11-20 NOTE — Progress Notes (Signed)
Following up visit from yesterday's visit ;  Patient talked about her childhood, marriage, her conversation with her daughter regarding her house in [place].  Her relative came to visit so chaplain ended the visit.

## 2021-11-20 NOTE — Progress Notes (Signed)
Daily Progress Note   Patient Name: Kara Mullins       Date: 11/20/2021 DOB: 01-16-1951  Age: 71 y.o. MRN#: [id number] Attending Physician: Flora Lipps, MD Primary Care Physician: [name], NP Admit Date: 11/08/2021  Reason for Consultation/Follow-up: Establishing goals of care  Subjective: I met today with Ms. Peed and her daughter.    She reports being motivated to get better and she questioned options including hospice vs going to SNF for trial of rehab.  She reports wanting to get to better and walk again and she feels that rehab is the best way to work toward this.  She is in agreement that a good plan would be to transition to SNF for trial of rehab. She has done well with rehabbing in the past. If she does well at this and continues to thrive, I encouraged they continue with this plan. If, however she is unable to regain function and he continues to decline, I recommended that she be followed by outpatient palliative care to see if she may be better served by focusing her care on being at home with support of organization such as hospice.   Length of Stay: 11  Current Medications: Scheduled Meds:   bacitracin-polymyxin b   Left Eye QHS   carvedilol  6.25 mg Oral BID WC   feeding supplement  237 mL Oral BID BM   mouth rinse  15 mL Mouth Rinse BID   multivitamin with minerals  1 tablet Oral Daily   rosuvastatin  10 mg Oral Daily   sodium bicarbonate  650 mg Oral TID    Continuous Infusions:  sodium chloride Stopped (11/17/21 1145)    PRN Meds: sodium chloride, acetaminophen **OR** acetaminophen, HYDROmorphone (DILAUDID) injection, lip balm, loratadine, ondansetron **OR** ondansetron (ZOFRAN) IV  Physical Exam         Elderly-appearing lady resting in bed No  respiratory distress S1-S2 Trace edema No focal deficits Abdomen not tender  Vital Signs: BP (!) 157/76 (BP Location: Left Arm)    Pulse 61    Temp (!) 97.5 F (36.4 C) (Oral)    Resp 18    Ht 5' 5"  (1.651 m)    Wt 81 kg Comment: 1 pillow, 1 sheet, 1 blanket   SpO2 100%    BMI 29.72 kg/m  SpO2: SpO2:  100 % O2 Device: O2 Device: Room Air O2 Flow Rate:    Intake/output summary:  Intake/Output Summary (Last 24 hours) at 11/20/2021 2238 Last data filed at 11/20/2021 1158 Gross per 24 hour  Intake 680 ml  Output 700 ml  Net -20 ml    LBM: Last BM Date: 11/20/21 Baseline Weight: Weight: 79.8 kg Most recent weight: Weight: 81 kg (1 pillow, 1 sheet, 1 blanket)       Palliative Assessment/Data:      Patient Active Problem List   Diagnosis Date Noted   Debility 89/38/1017   Metabolic acidosis 51/11/5850   Severe sepsis ([place]) 11/19/2021   Hypothermia 11/09/2021   Hyperlipidemia 11/09/2021   Chronic combined systolic and diastolic congestive heart failure ([place]) 11/09/2021   Stage 3b chronic kidney disease (CKD) ([place]) 11/09/2021   Pancytopenia ([place]) 11/09/2021   Severe protein-calorie malnutrition ([place]) 11/09/2021   Acute combined systolic and diastolic HF (heart failure) (HCC)    AKI (acute kidney injury) ([place])    CHF exacerbation ([place]) 03/06/2021   Essential hypertension    Hypertensive urgency 03/05/2021   Acute CHF (congestive heart failure) ([place]) 03/05/2021   Nausea and vomiting 03/05/2021   Elevated troponin level not due myocardial infarction 03/05/2021   Elevated d-dimer 03/05/2021   Generalized anxiety disorder 03/05/2021   Acute congestive heart failure ([place]) 03/05/2021    Palliative Care Assessment & Plan   Patient Profile:  71 year old lady with chronic combined systolic and diastolic heart failure, generalized anxiety disorder, dyslipidemia, hypertension, stage III chronic kidney disease brought into the emergency department because of difficulty swallowing,  during evaluation in the emergency department was found to have episode of syncope, hypothermic, admitted for acute kidney injury and urinary tract infection. Patient was placed on antibiotics, was seen and evaluated by renal services. Patient began declining lab work and oral medications, requested for comfort care.  Patient expressed her wishes for not undergoing current scope of hospitalization.  Patient's care plan was reflected to comfort measures which expressed her wishes and palliative consult was placed.  Assessment: As per patient's previously expressed wishes, she was switched to comfort measures, transferred out of the ICU, medications were discontinued at her request and patient was transferred to the fifth floor.  In a.m. on 11-19-2021, patient expressed a preference for discontinuing comfort measures and that she wished to have some medical treatment. I arrived at bedside on 12-09-2021 and another family meeting was held, see below.  Recommendations/Plan: - Continue DNR/DNI - Patient's goal is for SNF for trial of rehab.  Recommend she be followed as an outpatient by palliative care. - Her daughter requested completion of short term FMLA forms.  I will review these to see if it is something that I can complete for her.  Code Status:    Code Status Orders  (From admission, onward)           Start     Ordered   11/17/21 1742  Do not attempt resuscitation (DNR)  Continuous       Question Answer Comment  In the event of cardiac or respiratory ARREST Do not call a code blue   In the event of cardiac or respiratory ARREST Do not perform Intubation, CPR, defibrillation or ACLS   In the event of cardiac or respiratory ARREST Use medication by any route, position, wound care, and other measures to relive pain and suffering. May use oxygen, suction and manual treatment of airway obstruction as needed for comfort.  11/17/21 1741           Code Status History     Date  Active Date Inactive Code Status Order ID Comments User Context   11/09/2021 0734 11/17/2021 1741 Full Code [id number]  Reubin Milan, MD Inpatient   03/05/2021 0735 03/15/2021 1931 Full Code [id number]  Vernelle Emerald, MD ED       Prognosis:  Unable to determine  Discharge Planning: To Be Determined  Care plan was discussed with  patient, daughter  Also discussed with RN and TRH MD.   Thank you for allowing the Palliative Medicine Team to assist in the care of this patient.  Total time: 63 minutes  Micheline Rough, MD  Please contact Palliative Medicine Team phone at [phone] for questions and concerns.

## 2021-11-20 NOTE — Plan of Care (Signed)
Per Palliative care, pt is no longer comfort measures, she is DNR. Pt taking medications and has been placed back on telemetry. Pt has been more talkative and in good spirits this shift.  Problem: Education: Goal: Knowledge of General Education information will improve Description: Including pain rating scale, medication(s)/side effects and non-pharmacologic comfort measures Outcome: Progressing   Problem: Health Behavior/Discharge Planning: Goal: Ability to manage health-related needs will improve Outcome: Progressing   Problem: Clinical Measurements: Goal: Ability to maintain clinical measurements within normal limits will improve Outcome: Progressing Goal: Will remain free from infection Outcome: Progressing Goal: Diagnostic test results will improve Outcome: Progressing Goal: Respiratory complications will improve Outcome: Progressing Goal: Cardiovascular complication will be avoided Outcome: Progressing   Problem: Activity: Goal: Risk for activity intolerance will decrease Outcome: Progressing   Problem: Nutrition: Goal: Adequate nutrition will be maintained Outcome: Progressing   Problem: Coping: Goal: Level of anxiety will decrease Outcome: Progressing   Problem: Elimination: Goal: Will not experience complications related to bowel motility Outcome: Progressing Goal: Will not experience complications related to urinary retention Outcome: Progressing   Problem: Pain Managment: Goal: General experience of comfort will improve Outcome: Progressing   Problem: Safety: Goal: Ability to remain free from injury will improve Outcome: Progressing   Problem: Skin Integrity: Goal: Risk for impaired skin integrity will decrease Outcome: Progressing   Problem: Education: Goal: Ability to demonstrate management of disease process will improve Outcome: Progressing Goal: Ability to verbalize understanding of medication therapies will improve Outcome: Progressing Goal:  Individualized Educational Video(s) Outcome: Progressing   Problem: Activity: Goal: Capacity to carry out activities will improve Outcome: Progressing   Problem: Cardiac: Goal: Ability to achieve and maintain adequate cardiopulmonary perfusion will improve Outcome: Progressing   Problem: Safety: Goal: Non-violent Restraint(s) Outcome: Progressing

## 2021-11-20 NOTE — Progress Notes (Signed)
Occupational Therapy Treatment Patient Details Name: Kara Mullins MRN: [id number] DOB: Aug 19, 1951 Today's Date: 11/20/2021   History of present illness 71 y.o. female with medical history significant of chronic combined systolic and diastolic heart failure, generalized anxiety disorder, hyperlipidemia, hypertension, stage IIIb CKD who was brought to the emergency department due to inability to swallow less than an hour before arrival. While in ED, pt had episode of syncope, found to be hypothermic and evidence suggesting UTI. Dx of sepsis.   OT comments  Patient is very motivated and wanting to walk again. Upon arrival patient incontinent of stool therefore min A for rolling and total A bed level perianal care. Patient then min A to bring legs to edge of bed. With multiple attempts and mod to max verbal cues for body mechanics patient needing max A +1 to power up to standing position. Once balanced patient mod A +1 to take few steps to recliner chair. Educate patient on seated upper extremity exercises to perform to maximize functional strength needed for transfers. Patient return demonstrate.    Recommendations for follow up therapy are one component of a multi-disciplinary discharge planning process, led by the attending physician.  Recommendations may be updated based on patient status, additional functional criteria and insurance authorization.    Follow Up Recommendations  Skilled nursing-short term rehab (<3 hours/day)    Assistance Recommended at Discharge Frequent or constant Supervision/Assistance  Patient can return home with the following  A lot of help with walking and/or transfers;A lot of help with bathing/dressing/bathroom;Direct supervision/assist for medications management;Direct supervision/assist for financial management;Assistance with cooking/housework;Help with stairs or ramp for entrance;Assist for transportation   Equipment Recommendations  Other (comment) (defer next  venue)       Precautions / Restrictions Precautions Precautions: Fall Precaution Comments: pt/daughter report one loss of balance ~3 weeks ago, pt slid against a wall but didn't fall to floor. orthostatic standing on 2/2       Mobility Bed Mobility Overal bed mobility: Needs Assistance Bed Mobility: Supine to Sit, Rolling Rolling: Min assist   Supine to sit: Min assist, HOB elevated     General bed mobility comments: Min A for LE management during rolling for peri care, then to side of bed       Balance Overall balance assessment: Needs assistance Sitting-balance support: Feet supported Sitting balance-Leahy Scale: Fair     Standing balance support: Bilateral upper extremity supported, During functional activity, Reliant on assistive device for balance Standing balance-Leahy Scale: Poor                             ADL either performed or assessed with clinical judgement   ADL Overall ADL's : Needs assistance/impaired     Grooming: Set up;Sitting                   Toilet Transfer: Maximal assistance;Cueing for safety;Cueing for sequencing;Stand-pivot;Rolling walker (2 wheels) Toilet Transfer Details (indicate cue type and reason): Patient needing max A +1, multiple attempts with max verbal cues for follow through with hand placement and utilize momentum to power up to standing. Once standing patient mod A +1 to take few steps to recliner chair Toileting- Clothing Manipulation and Hygiene: Total assistance;Bed level Toileting - Clothing Manipulation Details (indicate cue type and reason): Patient incontinent of stool upon arrival to room, total A for perianal care with CNA assist  Cognition Arousal/Alertness: Awake/alert Behavior During Therapy: WFL for tasks assessed/performed Overall Cognitive Status: Impaired/Different from baseline Area of Impairment: Following commands                       Following Commands:  Follows one step commands inconsistently       General Comments: difficulty follow verbal cues for sit to stand        Exercises Exercises: Other exercises Other Exercises Other Exercises: Instruct patient in chair push ups and arm raises to perform throughout the day to maximize functional strength needed to assist with sit to stands/transfers. Able to demo chair push up X3 at end of session            Pertinent Vitals/ Pain       Pain Assessment Pain Assessment: Faces Faces Pain Scale: No hurt         Frequency  Min 2X/week        Progress Toward Goals  OT Goals(current goals can now be found in the care plan section)  Progress towards OT goals: Progressing toward goals  Acute Rehab OT Goals Patient Stated Goal: Get stronger OT Goal Formulation: With patient/family Time For Goal Achievement: 11/25/21 Potential to Achieve Goals: Good  Plan Discharge plan needs to be updated       AM-PAC OT "6 Clicks" Daily Activity     Outcome Measure   Help from another person eating meals?: A Little Help from another person taking care of personal grooming?: A Little Help from another person toileting, which includes using toliet, bedpan, or urinal?: A Lot Help from another person bathing (including washing, rinsing, drying)?: A Lot Help from another person to put on and taking off regular upper body clothing?: A Little Help from another person to put on and taking off regular lower body clothing?: Total 6 Click Score: 14    End of Session Equipment Utilized During Treatment: Gait belt;Rolling walker (2 wheels)  OT Visit Diagnosis: Unsteadiness on feet (R26.81);Muscle weakness (generalized) (M62.81)   Activity Tolerance Patient tolerated treatment well   Patient Left in chair;with call bell/phone within reach;with family/visitor present   Nurse Communication Mobility status        Time: 6389-3734 OT Time Calculation (min): 24 min  Charges: OT General  Charges $OT Visit: 1 Visit OT Treatments $Self Care/Home Management : 23-37 mins  Delbert Phenix OT OT pager: [phone]   Rosemary Holms 11/20/2021, 2:41 PM

## 2021-11-21 LAB — GLUCOSE, CAPILLARY
Glucose-Capillary: 101 mg/dL — ABNORMAL HIGH (ref 70–99)
Glucose-Capillary: 107 mg/dL — ABNORMAL HIGH (ref 70–99)
Glucose-Capillary: 113 mg/dL — ABNORMAL HIGH (ref 70–99)
Glucose-Capillary: 97 mg/dL (ref 70–99)
Glucose-Capillary: 97 mg/dL (ref 70–99)

## 2021-11-21 LAB — CBC
HCT: 24.5 % — ABNORMAL LOW (ref 36.0–46.0)
Hemoglobin: 7.5 g/dL — ABNORMAL LOW (ref 12.0–15.0)
MCH: 27.6 pg (ref 26.0–34.0)
MCHC: 30.6 g/dL (ref 30.0–36.0)
MCV: 90.1 fL (ref 80.0–100.0)
Platelets: 181 10*3/uL (ref 150–400)
RBC: 2.72 MIL/uL — ABNORMAL LOW (ref 3.87–5.11)
RDW: 17.1 % — ABNORMAL HIGH (ref 11.5–15.5)
WBC: 4.7 10*3/uL (ref 4.0–10.5)
nRBC: 0 % (ref 0.0–0.2)

## 2021-11-21 LAB — BASIC METABOLIC PANEL
Anion gap: 8 (ref 5–15)
BUN: 62 mg/dL — ABNORMAL HIGH (ref 8–23)
CO2: 19 mmol/L — ABNORMAL LOW (ref 22–32)
Calcium: 7.3 mg/dL — ABNORMAL LOW (ref 8.9–10.3)
Chloride: 115 mmol/L — ABNORMAL HIGH (ref 98–111)
Creatinine, Ser: 4.74 mg/dL — ABNORMAL HIGH (ref 0.44–1.00)
GFR, Estimated: 9 mL/min — ABNORMAL LOW (ref >60)
Glucose, Bld: 108 mg/dL — ABNORMAL HIGH (ref 70–99)
Potassium: 3.5 mmol/L (ref 3.5–5.1)
Sodium: 142 mmol/L (ref 135–145)

## 2021-11-21 LAB — MAGNESIUM: Magnesium: 2.8 mg/dL — ABNORMAL HIGH (ref 1.7–2.4)

## 2021-11-21 MED ORDER — HYDRALAZINE HCL 10 MG PO TABS
10.0000 mg | ORAL_TABLET | Freq: Three times a day (TID) | ORAL | Status: DC
  Administered 2021-11-21 – 2021-11-23 (×5): 10 mg via ORAL
  Filled 2021-11-21 (×10): qty 1

## 2021-11-21 NOTE — Progress Notes (Signed)
Pt had multiple loose BM and she states she's lactose intolerant and she think the ensure she's getting is making her have the diarrhea. Reported off to oncoming and MD to address in am. Delia Heady RN

## 2021-11-21 NOTE — Progress Notes (Signed)
PROGRESS NOTE    Trinisha Paget  NTI:[id number] DOB: Jul 11, 1951 DOA: 11/08/2021 PCP: [name], NP    Brief Narrative:  71 y.o. female with past medical history significant of chronic combined systolic and diastolic heart failure, generalized anxiety disorder, hyperlipidemia, hypertension, stage IIIb CKD was brought into the hospital with dysphagia. While in ED, patient had a stable vitals except for hypothermia but had an episode of syncope and urinalysis suggesting UTI.  Chest x-ray showed cardiomegaly.  CT head was unremarkable.  Patient was given IV fluids, antibiotic and was admitted to the hospital for further evaluation and treatment.    During hospitalization, patient was seen by nephrology.  Patient is a poor candidate for dialysis and due to general health decline decision was not to proceed with further aggressive treatment.    Assessment and Plan: * AKI (acute kidney injury) ([place])- (present on admission) Baseline creatinine ranging from 1.3-1.6.  Has history of CKD stage IIIb.  Nephrology on board.  Latest creatinine elevated at 4.7<4.5 <4.5.  We will continue to monitor BMP.  Nephrology has signed off at this time.  Severe sepsis ([place])- (present on admission) Resolved at this time.  Secondary to UTI.  Patient had hypothermia, pancytopenia, acute kidney injury and metabolic encephalopathy on presentation.  Urine culture was positive for Enterobacter.  Patient received meropenem during hospitalization pleated more than 7-day course.  Blood cultures have been negative so far.  Stage 3b chronic kidney disease (CKD) ([place])- (present on admission)  Nonuremic at this time.  On external catheter.  Metabolic acidosis Nonanion gap metabolic acidosis secondary to AKI on CKD.    Continue sodium bicarbonate as per nephrology.  Debility Physical therapy has seen the patient at this time and recommend skilled nursing facility placement on discharge  Severe protein-calorie  malnutrition ([place])- (present on admission) Continue nutritional support.  Dysphagia 3 diet as per speech therapy.  Continue Ensure.  Pancytopenia ([place])- (present on admission) Improved.  Latest CBC with normal white cells and platelet of 181.  Hemoglobin low at 7.5.  Chronic combined systolic and diastolic congestive heart failure ([place])- (present on admission) Has chronic pedal edema.  No decompensation at this time.  Chest x-ray without evidence of vascular congestion.  Lasix losartan spironolactone on hold due to acute kidney injury.  Hyperlipidemia- (present on admission) On Crestor.  Hypothermia- (present on admission) Secondary to infection.  Resolved at this time.  Essential hypertension- (present on admission) Patient has been started on Coreg.  Blood pressure slightly elevated.  We will add low-dose oral hydralazine.  Ethics/goals of care. Patient was seen by palliative care during hospitalization.  At this time, family wishes to continue with current level of care.  Patient was temporarily on comfort care.  Patient is  DNR.   DVT prophylaxis: SCDs Start: 11/09/21 8676   Code Status:     Code Status: DNR  Disposition: Skilled nursing facility with palliative care.  Status is: Inpatient  Remains inpatient appropriate because: Renal failure, need for skilled nursing facility placement.  Family Communication:  I again spoke with the patient's daughter at bedside.  Consultants:  Palliative care Nephrology  Procedures:  Urethral catheter insertion and removal.  Antimicrobials:  Meropenem 11/13/21>11/20/21  Anti-infectives (From admission, onward)    Start     Dose/Rate Route Frequency Ordered Stop   11/14/21 2200  meropenem (MERREM) 500 mg in sodium chloride 0.9 % 100 mL IVPB  Status:  Discontinued        500 mg 200 mL/hr over  30 Minutes Intravenous Every 12 hours 11/14/21 1332 11/20/21 1402   11/13/21 1300  meropenem (MERREM) 1 g in sodium chloride 0.9 % 100 mL  IVPB  Status:  Discontinued        1 g 200 mL/hr over 30 Minutes Intravenous Every 12 hours 11/13/21 1151 11/14/21 1332   11/09/21 2200  ceFEPIme (MAXIPIME) 2 g in sodium chloride 0.9 % 100 mL IVPB  Status:  Discontinued        2 g 200 mL/hr over 30 Minutes Intravenous Every 24 hours 11/09/21 0323 11/13/21 1148   11/09/21 0322  vancomycin variable dose per unstable renal function (pharmacist dosing)  Status:  Discontinued         Does not apply See admin instructions 11/09/21 0323 11/09/21 1355   11/09/21 0215  ceFEPIme (MAXIPIME) 2 g in sodium chloride 0.9 % 100 mL IVPB        2 g 200 mL/hr over 30 Minutes Intravenous  Once 11/09/21 0210 11/09/21 0302   11/09/21 0215  metroNIDAZOLE (FLAGYL) IVPB 500 mg  Status:  Discontinued        500 mg 100 mL/hr over 60 Minutes Intravenous Every 12 hours 11/09/21 0210 11/12/21 1128   11/09/21 0215  vancomycin (VANCOCIN) IVPB 1000 mg/200 mL premix        1,000 mg 200 mL/hr over 60 Minutes Intravenous  Once 11/09/21 0210 11/09/21 0339       Subjective: Today, patient was seen and examined at bedside.  Patient states that she feels better.  Denies any dyspnea, cough, dizziness, chest pain and fever.   Objective: Vitals:   11/20/21 1958 11/21/21 0341 11/21/21 0838 11/21/21 1221  BP: (!) 157/76 (!) 146/81 (!) 161/101 (!) 167/74  Pulse: 61 (!) 54 (!) 58 (!) 50  Resp: 18 16 16    Temp: (!) 97.5 F (36.4 C) (!) 97.4 F (36.3 C) 98.1 F (36.7 C) 97.8 F (36.6 C)  TempSrc: Oral Oral Oral Oral  SpO2: 100% 100% 100% 100%  Weight:      Height:        Intake/Output Summary (Last 24 hours) at 11/21/2021 1337 Last data filed at 11/21/2021 0800 Gross per 24 hour  Intake 360 ml  Output 300 ml  Net 60 ml    Filed Weights   11/09/21 0330 11/13/21 1240  Weight: 79.8 kg 81 kg   Body mass index is 29.72 kg/m.   Physical Examination:  General:  Average built, not in obvious distress HENT:   No scleral pallor or icterus noted. Oral mucosa is moist.   Chest:  Clear breath sounds.  Diminished breath sounds bilaterally. No crackles or wheezes.  CVS: S1 &S2 heard. No murmur.  Regular rate and rhythm. Abdomen: Soft, nontender, nondistended.  Bowel sounds are heard.  External catheter in place. Extremities: No cyanosis, clubbing with bilateral gross pedal edema.  Peripheral pulses are palpable. Psych: Alert, awake and oriented, normal mood CNS:  No cranial nerve deficits.  Generalized weakness noted. Skin: Warm and dry.  No rashes noted.  Data Reviewed:   CBC: Recent Labs  Lab 11/19/21 1615 11/20/21 0510 11/21/21 0451  WBC 5.9 4.9 4.7  HGB 8.8* 7.8* 7.5*  HCT 28.8* 25.3* 24.5*  MCV 88.6 90.0 90.1  PLT 193 170 181     Basic Metabolic Panel: Recent Labs  Lab 11/15/21 0250 11/16/21 0246 11/17/21 0250 11/19/21 1615 11/20/21 0510 11/21/21 0451  NA 143 143 140 144 143 142  K 3.5 3.5 3.6 3.9 3.8 3.5  CL 121* 119* 115* 118* 119* 115*  CO2 17* 17* 18* 20* 18* 19*  GLUCOSE 120* 126* 169* 125* 137* 108*  BUN 65* 63* 63* 62* 63* 62*  CREATININE 4.49* 4.53* 4.39* 4.55* 4.51* 4.74*  CALCIUM 7.2* 7.4* 7.2* 7.7* 7.4* 7.3*  MG  --   --   --  2.9* 2.8* 2.8*  PHOS 6.4*  --   --   --  6.8*  --      GFR: Estimated Creatinine Clearance: 11.8 mL/min (A) (by C-G formula based on SCr of 4.74 mg/dL (H)).  Liver Function Tests: Recent Labs  Lab 11/15/21 0250 11/20/21 0510  AST  --  65*  ALT  --  27  ALKPHOS  --  144*  BILITOT  --  0.4  PROT  --  4.7*  ALBUMIN 1.8* 1.7*     CBG: Recent Labs  Lab 11/20/21 1652 11/20/21 1957 11/21/21 0002 11/21/21 0805 11/21/21 1245  GLUCAP 118* 128* 107* 101* 97      No results found for this or any previous visit (from the past 240 hour(s)).     Radiology Studies: No results found.   Scheduled Meds:  bacitracin-polymyxin b   Left Eye QHS   carvedilol  6.25 mg Oral BID WC   feeding supplement  237 mL Oral BID BM   hydrALAZINE  10 mg Oral Q8H   mouth rinse  15 mL Mouth Rinse  BID   multivitamin with minerals  1 tablet Oral Daily   rosuvastatin  10 mg Oral Daily   sodium bicarbonate  650 mg Oral TID   Continuous Infusions:  sodium chloride Stopped (11/17/21 1145)     LOS: 12 days    Flora Lipps, MD [facility] 11/21/2021, 1:37 PM

## 2021-11-22 DIAGNOSIS — I5042 Chronic combined systolic (congestive) and diastolic (congestive) heart failure: Secondary | ICD-10-CM

## 2021-11-22 LAB — BASIC METABOLIC PANEL
Anion gap: 6 (ref 5–15)
BUN: 61 mg/dL — ABNORMAL HIGH (ref 8–23)
CO2: 18 mmol/L — ABNORMAL LOW (ref 22–32)
Calcium: 7.2 mg/dL — ABNORMAL LOW (ref 8.9–10.3)
Chloride: 119 mmol/L — ABNORMAL HIGH (ref 98–111)
Creatinine, Ser: 4.35 mg/dL — ABNORMAL HIGH (ref 0.44–1.00)
GFR, Estimated: 10 mL/min — ABNORMAL LOW (ref >60)
Glucose, Bld: 99 mg/dL (ref 70–99)
Potassium: 3.8 mmol/L (ref 3.5–5.1)
Sodium: 143 mmol/L (ref 135–145)

## 2021-11-22 LAB — CBC
HCT: 24.7 % — ABNORMAL LOW (ref 36.0–46.0)
Hemoglobin: 7.5 g/dL — ABNORMAL LOW (ref 12.0–15.0)
MCH: 27.4 pg (ref 26.0–34.0)
MCHC: 30.4 g/dL (ref 30.0–36.0)
MCV: 90.1 fL (ref 80.0–100.0)
Platelets: 198 10*3/uL (ref 150–400)
RBC: 2.74 MIL/uL — ABNORMAL LOW (ref 3.87–5.11)
RDW: 17.3 % — ABNORMAL HIGH (ref 11.5–15.5)
WBC: 4.8 10*3/uL (ref 4.0–10.5)
nRBC: 0 % (ref 0.0–0.2)

## 2021-11-22 LAB — GLUCOSE, CAPILLARY
Glucose-Capillary: 88 mg/dL (ref 70–99)
Glucose-Capillary: 117 mg/dL — ABNORMAL HIGH (ref 70–99)
Glucose-Capillary: 62 mg/dL — ABNORMAL LOW (ref 70–99)
Glucose-Capillary: 110 mg/dL — ABNORMAL HIGH (ref 70–99)
Glucose-Capillary: 104 mg/dL — ABNORMAL HIGH (ref 70–99)
Glucose-Capillary: 123 mg/dL — ABNORMAL HIGH (ref 70–99)

## 2021-11-22 LAB — MAGNESIUM: Magnesium: 2.8 mg/dL — ABNORMAL HIGH (ref 1.7–2.4)

## 2021-11-22 NOTE — Progress Notes (Signed)
PROGRESS NOTE    Kara Mullins  MLY:[id number] DOB: 09-Nov-1950 DOA: 11/08/2021 PCP: [name], NP    Brief Narrative:  71 y.o. female with past medical history significant of chronic combined systolic and diastolic heart failure, generalized anxiety disorder, hyperlipidemia, hypertension, stage IIIb CKD was brought into the hospital with dysphagia. While in ED, patient had a stable vitals except for hypothermia but had an episode of syncope and urinalysis suggesting UTI.  Chest x-ray showed cardiomegaly.  CT head was unremarkable.  Patient was given IV fluids, antibiotic and was admitted to the hospital for further evaluation and treatment.    During hospitalization, patient was seen by nephrology.  Patient is a poor candidate for dialysis and due to general health decline decision was not to proceed with further aggressive treatment.  At this time patient is stable for disposition to skilled nursing facility.    Assessment and Plan: * AKI (acute kidney injury) ([place])- (present on admission) Baseline creatinine ranging from 1.3-1.6.  Has history of CKD stage IIIb.  Nephrology on board.  Latest creatinine elevated at 4.3 from 4.7<4.5 <4.5.  We will continue to monitor BMP.  Nephrology has signed off at this time.  Stage 3b chronic kidney disease (CKD) ([place])- (present on admission)  Nonuremic at this time.  On external catheter.  Severe sepsis ([place])- (present on admission) Resolved at this time.  Secondary to UTI.  Completed 7-day course of meropenem during hospitalization.  Blood cultures were negative.    Metabolic acidosis Nonanion gap metabolic acidosis secondary to AKI on CKD.    Continue sodium bicarbonate as per nephrology.  Debility Physical therapy has seen the patient at this time and recommend skilled nursing facility placement on discharge  Severe protein-calorie malnutrition ([place])- (present on admission) Continue nutritional support.  Dysphagia 3 diet as per speech  therapy.  Continue Ensure.  Pancytopenia ([place])- (present on admission) Improved.  Latest CBC with normal white cells and platelet of 198.Marland Kitchen  Hemoglobin low at 7.5.  Chronic combined systolic and diastolic congestive heart failure ([place])- (present on admission) Has chronic pedal edema.  No decompensation at this time.  Chest x-ray without evidence of vascular congestion.  Lasix losartan spironolactone on hold due to acute kidney injury.  Hyperlipidemia- (present on admission) On Crestor.  Hypothermia- (present on admission) Secondary to infection.  Resolved at this time.  Essential hypertension- (present on admission) Coreg and low-dose hydralazine.  Adjust as necessary.  Ethics/goals of care. Patient was seen by palliative care during hospitalization.  At this time, family wishes to continue with current level of care.  Patient was temporarily on comfort care.  Patient is  DNR.   DVT prophylaxis: SCDs Start: 11/09/21 8127   Code Status:     Code Status: DNR  Disposition: Skilled nursing facility with palliative care.  Status is: Inpatient  Remains inpatient appropriate because: Renal failure, need for skilled nursing facility placement.  Family Communication:  I again spoke with the patient's daughter at bedside.    Consultants:  Palliative care Nephrology  Procedures:  Urethral catheter insertion and removal.  Antimicrobials:  Meropenem 11/13/21>11/20/21  Subjective: Today, patient was seen and examined at bedside patient continues to feel better.  Denies any chest pain, shortness of breath, dyspnea, nausea or vomiting   Objective: Vitals:   11/21/21 1221 11/21/21 2037 11/22/21 0445 11/22/21 0834  BP: (!) 167/74 (!) 150/78 (!) 148/75 (!) 144/75  Pulse: (!) 50 (!) 54 (!) 56 (!) 55  Resp:  18 18  Temp: 97.8 F (36.6 C) (!) 97.5 F (36.4 C) (!) 97.4 F (36.3 C)   TempSrc: Oral Oral Oral   SpO2: 100% 97% 100% 100%  Weight:      Height:        Intake/Output  Summary (Last 24 hours) at 11/22/2021 1307 Last data filed at 11/21/2021 1700 Gross per 24 hour  Intake 240 ml  Output --  Net 240 ml    Filed Weights   11/09/21 0330 11/13/21 1240  Weight: 79.8 kg 81 kg   Body mass index is 29.72 kg/m.   Physical Examination:  General:  Average built, not in obvious distress HENT:   No scleral pallor or icterus noted. Oral mucosa is moist.  Chest:  Clear breath sounds.  Diminished breath sounds bilaterally. No crackles or wheezes.  CVS: S1 &S2 heard. No murmur.  Regular rate and rhythm. Abdomen: Soft, nontender, nondistended.  Bowel sounds are heard.   Extremities: No cyanosis, clubbing, bilateral trace pitting edema.  Peripheral pulses are palpable. Psych: Alert, awake and oriented, normal mood CNS:  No cranial nerve deficits.   Generalized weakness noted Skin: Warm and dry.  No rashes noted.  Data Reviewed:   CBC: Recent Labs  Lab 11/19/21 1615 11/20/21 0510 11/21/21 0451 11/22/21 0439  WBC 5.9 4.9 4.7 4.8  HGB 8.8* 7.8* 7.5* 7.5*  HCT 28.8* 25.3* 24.5* 24.7*  MCV 88.6 90.0 90.1 90.1  PLT 193 170 181 198     Basic Metabolic Panel: Recent Labs  Lab 11/17/21 0250 11/19/21 1615 11/20/21 0510 11/21/21 0451 11/22/21 0439  NA 140 144 143 142 143  K 3.6 3.9 3.8 3.5 3.8  CL 115* 118* 119* 115* 119*  CO2 18* 20* 18* 19* 18*  GLUCOSE 169* 125* 137* 108* 99  BUN 63* 62* 63* 62* 61*  CREATININE 4.39* 4.55* 4.51* 4.74* 4.35*  CALCIUM 7.2* 7.7* 7.4* 7.3* 7.2*  MG  --  2.9* 2.8* 2.8* 2.8*  PHOS  --   --  6.8*  --   --      GFR: Estimated Creatinine Clearance: 12.8 mL/min (A) (by C-G formula based on SCr of 4.35 mg/dL (H)).  Liver Function Tests: Recent Labs  Lab 11/20/21 0510  AST 65*  ALT 27  ALKPHOS 144*  BILITOT 0.4  PROT 4.7*  ALBUMIN 1.7*     CBG: Recent Labs  Lab 11/21/21 1625 11/21/21 2037 11/22/21 0745 11/22/21 1202 11/22/21 1224  GLUCAP 113* 97 88 62* 117*     No results found for this or any  previous visit (from the past 240 hour(s)).    Radiology Studies: No results found.   Scheduled Meds:  bacitracin-polymyxin b   Left Eye QHS   carvedilol  6.25 mg Oral BID WC   feeding supplement  237 mL Oral BID BM   hydrALAZINE  10 mg Oral Q8H   mouth rinse  15 mL Mouth Rinse BID   multivitamin with minerals  1 tablet Oral Daily   rosuvastatin  10 mg Oral Daily   sodium bicarbonate  650 mg Oral TID   Continuous Infusions:  sodium chloride Stopped (11/17/21 1145)     LOS: 13 days    Flora Lipps, MD [facility] 11/22/2021, 1:07 PM

## 2021-11-22 NOTE — Progress Notes (Addendum)
Patient temp is now 94.8. Patient is alert and oriented. Appears to be in no distress. Notified Dr. Louanne Belton and charge nurse Harvest Dark, RN. Packed patient in warm blankets, K pad, heating packs. Per Dr. Louanne Belton recheck temp in 2 hours. Will continue yellow mews protocol.

## 2021-11-22 NOTE — Care Management Important Message (Signed)
Important Message  Patient Details IM Letter placed in Patients room. Name: Kara Mullins MRN: [id number] Date of Birth: 1950/11/21   Medicare Important Message Given:  Yes     [name] 11/22/2021, 10:19 AM

## 2021-11-22 NOTE — Progress Notes (Signed)
CBG at 1202 was 62. Initiated hypoglycemic protocol and gave patient 4oz of orange juice. Rechecked CBG 15 minutes later and CBG is now 117. Notified Dr. Louanne Belton no new orders at this time.

## 2021-11-22 NOTE — Progress Notes (Signed)
Physical Therapy Treatment Patient Details Name: Kara Mullins MRN: [id number] DOB: 08-07-1951 Today's Date: 11/22/2021   History of Present Illness 71 y.o. female with medical history significant of chronic combined systolic and diastolic heart failure, generalized anxiety disorder, hyperlipidemia, hypertension, stage IIIb CKD who was brought to the emergency department due to inability to swallow less than an hour before arrival. While in ED, pt had episode of syncope, found to be hypothermic and evidence suggesting UTI. Dx of sepsis.    PT Comments    Pt very pleasant and eager to participate in therapy session today. She is progressing toward acute PT goals this session with increased ambulation distance. Pt ambulated ~57ft and additional 13ft following seated rest with MOD A, use of RW, and close chair follow for safety. Pt continues to require +2 assist for power up to rise with sit to stand transfers, able to demonstrate sit to stand transfer with MIN guard x1 during session today. Pt will benefit from continued skilled PT to increase independence and maximize safety with mobility.     Recommendations for follow up therapy are one component of a multi-disciplinary discharge planning process, led by the attending physician.  Recommendations may be updated based on patient status, additional functional criteria and insurance authorization.  Follow Up Recommendations  Skilled nursing-short term rehab (<3 hours/day)     Assistance Recommended at Discharge Frequent or constant Supervision/Assistance  Patient can return home with the following A lot of help with walking and/or transfers;A lot of help with bathing/dressing/bathroom;Assistance with cooking/housework;Assist for transportation;Help with stairs or ramp for entrance   Equipment Recommendations  Rolling walker (2 wheels)    Recommendations for Other Services       Precautions / Restrictions Precautions Precautions:  Fall Precaution Comments: pt/daughter report one loss of balance ~3 weeks ago, pt slid against a wall but didn't fall to floor. orthostatic standing on 2/2 Restrictions Weight Bearing Restrictions: No     Mobility  Bed Mobility Overal bed mobility: Needs Assistance Bed Mobility: Supine to Sit     Supine to sit: Min guard, HOB elevated     General bed mobility comments: MIN guard and increased time,  HOB elevated. increased time and MIN A to scoot hips to EOB.    Transfers Overall transfer level: Needs assistance Equipment used: Rolling walker (2 wheels) Transfers: Sit to/from Stand Sit to Stand: Min assist, +2 safety/equipment, Mod assist           General transfer comment: x3; Assist for power up from EOB to initaite rise and steady with use of RW. cues for safe hand placement and B LE placement for efficency/ease of transfer. Pt able to perform 3rd stand during session from recliner chair with MIN guard and use of B UEs on armrest to power up to stand. Pt with x1 impulsive stand to sit transfer and decreased eccentric control sitting in chair prior to cuing and knowing that recliner chair is in safe position behind her.    Ambulation/Gait Ambulation/Gait assistance: Mod assist Gait Distance (Feet): 20 Feet (additional 16 following seated rest) Assistive device: Rolling walker (2 wheels) Gait Pattern/deviations: Step-to pattern, Decreased step length - right, Decreased step length - left, Decreased stride length, Shuffle, Wide base of support, Step-through pattern Gait velocity: decr     General Gait Details: Pt ambulated 1ft and additional 60ft following seated rest, increased LE muscle fatigue. Pt demonstrated excessive lateral weight shifting to offload and progress LEs forward. MOD A for stability and cues to  maintain safe proximity to RW. decreased foot clearance bilaterally. close chair follow for safety. X1 mild posterior LOB, MOD A for correction and pt demonstrating  fair hip strategy to correct.   Stairs             Wheelchair Mobility    Modified Rankin (Stroke Patients Only)       Balance Overall balance assessment: Needs assistance Sitting-balance support: Feet supported Sitting balance-Leahy Scale: Fair     Standing balance support: Bilateral upper extremity supported, During functional activity, Reliant on assistive device for balance Standing balance-Leahy Scale: Poor Standing balance comment: reliant on walker and external support from therapist.                            Cognition Arousal/Alertness: Awake/alert Behavior During Therapy: WFL for tasks assessed/performed Overall Cognitive Status: Within Functional Limits for tasks assessed                                          Exercises      General Comments        Pertinent Vitals/Pain Pain Assessment Pain Assessment: No/denies pain    Home Living                          Prior Function            PT Goals (current goals can now be found in the care plan section) Acute Rehab PT Goals Patient Stated Goal: be able to walk at home PT Goal Formulation: With patient/family Time For Goal Achievement: 11/25/21 Potential to Achieve Goals: Good Progress towards PT goals: Progressing toward goals    Frequency    Min 2X/week      PT Plan Current plan remains appropriate    Co-evaluation              AM-PAC PT "6 Clicks" Mobility   Outcome Measure  Help needed turning from your back to your side while in a flat bed without using bedrails?: A Little Help needed moving from lying on your back to sitting on the side of a flat bed without using bedrails?: A Lot Help needed moving to and from a bed to a chair (including a wheelchair)?: A Lot Help needed standing up from a chair using your arms (e.g., wheelchair or bedside chair)?: A Lot Help needed to walk in hospital room?: A Lot Help needed climbing 3-5 steps  with a railing? : Total 6 Click Score: 12    End of Session Equipment Utilized During Treatment: Gait belt Activity Tolerance: Patient tolerated treatment well Patient left: in chair;with call bell/phone within reach;with family/visitor present;with chair alarm set Nurse Communication: Mobility status PT Visit Diagnosis: Difficulty in walking, not elsewhere classified (R26.2)     Time: 2778-2423 PT Time Calculation (min) (ACUTE ONLY): 24 min  Charges:  $Therapeutic Activity: 23-37 mins                     Festus Barren PT, DPT  Acute Rehabilitation Services  Office [phone]  11/22/2021, 3:29 PM

## 2021-11-22 NOTE — Progress Notes (Signed)
Patient temp is now 95.3 rectally. She is still alert and oriented x4 and is no distress. Notified charge nurse Harvest Dark, RN and Dr. Louanne Belton. Per Dr. Louanne Belton continue heat packs, k pad, and warm blanket interventions and continue to monitor patient temp. Family is at bedside. Patient and family requested hot tea. Hot tea was provided. Will continue yellow mews protocol.     11/22/21 1802  Assess: MEWS Score  Temp (!) 95.3 F (35.2 C)  BP (!) 162/85  Pulse Rate (!) 58  Resp 14  SpO2 100 %  O2 Device Room Air  Assess: MEWS Score  MEWS Temp 1  MEWS Systolic 0  MEWS Pulse 0  MEWS RR 0  MEWS LOC 0  MEWS Score 1  MEWS Score Color Green  Assess: if the MEWS score is Yellow or Red  Were vital signs taken at a resting state? No  Focused Assessment No change from prior assessment  Does the patient meet 2 or more of the SIRS criteria? No  MEWS guidelines implemented *See Row Information* No, previously yellow, continue vital signs every 4 hours  Notify: Charge Nurse/RN  Name of Charge Nurse/RN Notified Harvest Dark, RN  Date Charge Nurse/RN Notified 11/22/21  Time Charge Nurse/RN Notified 1805  Notify: Provider  Provider Name/Title Dr. Louanne Belton  Date Provider Notified 11/22/21  Time Provider Notified [id number]  Notification Type Page  Notification Reason Other (Comment) (temp)  Provider response Other (Comment) (continue previous interventions.)  Date of Provider Response 11/22/21  Time of Provider Response 1803  Assess: SIRS CRITERIA  SIRS Temperature  1  SIRS Pulse 0  SIRS Respirations  0  SIRS WBC 0  SIRS Score Sum  1

## 2021-11-22 NOTE — Progress Notes (Signed)
See previous note.     11/22/21 1557  Assess: MEWS Score  Temp (!) 94.8 F (34.9 C)  BP (!) 157/73  Pulse Rate (!) 52  Resp 14  SpO2 100 %  O2 Device Room Air  Assess: MEWS Score  MEWS Temp 2  MEWS Systolic 0  MEWS Pulse 0  MEWS RR 0  MEWS LOC 0  MEWS Score 2  MEWS Score Color Yellow  Assess: if the MEWS score is Yellow or Red  Were vital signs taken at a resting state? No  Focused Assessment No change from prior assessment  Does the patient meet 2 or more of the SIRS criteria? No  MEWS guidelines implemented *See Row Information* No, previously yellow, continue vital signs every 4 hours  Document  Patient Outcome Other (Comment) (remains on unit)  Progress note created (see row info) Yes  Assess: SIRS CRITERIA  SIRS Temperature  1  SIRS Pulse 0  SIRS Respirations  0  SIRS WBC 0  SIRS Score Sum  1

## 2021-11-22 NOTE — Progress Notes (Addendum)
Patient is in the yellow mews for her BP and HR. Initiated yellow mews protocol. Patient complains of some dizziness and is sitting in the chair. Pt is not in any distress at this time. Notified charge nurse Harvest Dark, RN and Dr. Louanne Belton. Dr. Louanne Belton as instructed to recheck CBG and recheck BP and HR around 1450. CBG rechecked and it is 110 Dr. Louanne Belton aware. Will continue to follow yellow mews protocol.   Addendum: Early VS patient was sitting up in the chair. On repeat BP patient is lying in the bed and her BP is now 112/63 MAP 78 HR 50. Notified Dr. Louanne Belton.    11/22/21 1357  Assess: MEWS Score  Temp (!) 97.5 F (36.4 C)  BP (!) 87/55  Pulse Rate (!) 43  Resp 12  SpO2 100 %  O2 Device Room Air  Assess: MEWS Score  MEWS Temp 0  MEWS Systolic 1  MEWS Pulse 1  MEWS RR 1  MEWS LOC 0  MEWS Score 3  MEWS Score Color Yellow  Assess: if the MEWS score is Yellow or Red  Were vital signs taken at a resting state? Yes  Focused Assessment No change from prior assessment  Does the patient meet 2 or more of the SIRS criteria? No  MEWS guidelines implemented *See Row Information* Yes  Treat  Pain Scale 0-10  Pain Score 0  Take Vital Signs  Increase Vital Sign Frequency  Yellow: Q 2hr X 2 then Q 4hr X 2, if remains yellow, continue Q 4hrs  Escalate  MEWS: Escalate Yellow: discuss with charge nurse/RN and consider discussing with provider and RRT  Notify: Charge Nurse/RN  Name of Charge Nurse/RN Notified Harvest Dark, RN  Date Charge Nurse/RN Notified 11/22/21  Time Charge Nurse/RN Notified 57  Notify: Provider  Provider Name/Title Dr. Louanne Belton  Date Provider Notified 11/22/21  Time Provider Notified 1401  Notification Type Page  Notification Reason Change in status  Provider response Other (Comment) (Recheck BP in 30 mins around 1446. May try small bolus.)  Date of Provider Response 11/22/21  Time of Provider Response 1416  Document  Patient Outcome Other (Comment) (remains  on unit)  Assess: SIRS CRITERIA  SIRS Temperature  0  SIRS Pulse 0  SIRS Respirations  0  SIRS WBC 0  SIRS Score Sum  0

## 2021-11-23 DIAGNOSIS — R918 Other nonspecific abnormal finding of lung field: Secondary | ICD-10-CM | POA: Diagnosis not present

## 2021-11-23 DIAGNOSIS — N179 Acute kidney failure, unspecified: Secondary | ICD-10-CM | POA: Diagnosis not present

## 2021-11-23 DIAGNOSIS — N390 Urinary tract infection, site not specified: Secondary | ICD-10-CM | POA: Diagnosis not present

## 2021-11-23 DIAGNOSIS — Z743 Need for continuous supervision: Secondary | ICD-10-CM | POA: Diagnosis not present

## 2021-11-23 DIAGNOSIS — Z0289 Encounter for other administrative examinations: Secondary | ICD-10-CM | POA: Diagnosis not present

## 2021-11-23 DIAGNOSIS — R6520 Severe sepsis without septic shock: Secondary | ICD-10-CM | POA: Diagnosis not present

## 2021-11-23 DIAGNOSIS — E785 Hyperlipidemia, unspecified: Secondary | ICD-10-CM | POA: Diagnosis not present

## 2021-11-23 DIAGNOSIS — J1282 Pneumonia due to coronavirus disease 2019: Secondary | ICD-10-CM | POA: Diagnosis not present

## 2021-11-23 DIAGNOSIS — T68XXXA Hypothermia, initial encounter: Secondary | ICD-10-CM | POA: Diagnosis not present

## 2021-11-23 DIAGNOSIS — R609 Edema, unspecified: Secondary | ICD-10-CM | POA: Diagnosis not present

## 2021-11-23 DIAGNOSIS — R54 Age-related physical debility: Secondary | ICD-10-CM | POA: Diagnosis not present

## 2021-11-23 DIAGNOSIS — I5043 Acute on chronic combined systolic (congestive) and diastolic (congestive) heart failure: Secondary | ICD-10-CM | POA: Diagnosis not present

## 2021-11-23 DIAGNOSIS — N1831 Chronic kidney disease, stage 3a: Secondary | ICD-10-CM | POA: Diagnosis not present

## 2021-11-23 DIAGNOSIS — N184 Chronic kidney disease, stage 4 (severe): Secondary | ICD-10-CM | POA: Diagnosis not present

## 2021-11-23 DIAGNOSIS — I10 Essential (primary) hypertension: Secondary | ICD-10-CM | POA: Diagnosis not present

## 2021-11-23 DIAGNOSIS — R262 Difficulty in walking, not elsewhere classified: Secondary | ICD-10-CM | POA: Diagnosis not present

## 2021-11-23 DIAGNOSIS — D61818 Other pancytopenia: Secondary | ICD-10-CM | POA: Diagnosis not present

## 2021-11-23 DIAGNOSIS — E872 Acidosis, unspecified: Secondary | ICD-10-CM

## 2021-11-23 DIAGNOSIS — R5381 Other malaise: Secondary | ICD-10-CM

## 2021-11-23 DIAGNOSIS — N1832 Chronic kidney disease, stage 3b: Secondary | ICD-10-CM | POA: Diagnosis not present

## 2021-11-23 DIAGNOSIS — I5042 Chronic combined systolic (congestive) and diastolic (congestive) heart failure: Secondary | ICD-10-CM | POA: Diagnosis not present

## 2021-11-23 DIAGNOSIS — U071 COVID-19: Secondary | ICD-10-CM | POA: Diagnosis not present

## 2021-11-23 DIAGNOSIS — A419 Sepsis, unspecified organism: Secondary | ICD-10-CM | POA: Diagnosis not present

## 2021-11-23 DIAGNOSIS — E43 Unspecified severe protein-calorie malnutrition: Secondary | ICD-10-CM | POA: Diagnosis not present

## 2021-11-23 DIAGNOSIS — M6281 Muscle weakness (generalized): Secondary | ICD-10-CM | POA: Diagnosis not present

## 2021-11-23 DIAGNOSIS — R531 Weakness: Secondary | ICD-10-CM | POA: Diagnosis not present

## 2021-11-23 LAB — GLUCOSE, CAPILLARY
Glucose-Capillary: 77 mg/dL (ref 70–99)
Glucose-Capillary: 94 mg/dL (ref 70–99)
Glucose-Capillary: 97 mg/dL (ref 70–99)

## 2021-11-23 LAB — CBC
HCT: 24.6 % — ABNORMAL LOW (ref 36.0–46.0)
Hemoglobin: 7.6 g/dL — ABNORMAL LOW (ref 12.0–15.0)
MCH: 27.8 pg (ref 26.0–34.0)
MCHC: 30.9 g/dL (ref 30.0–36.0)
MCV: 90.1 fL (ref 80.0–100.0)
Platelets: 210 10*3/uL (ref 150–400)
RBC: 2.73 MIL/uL — ABNORMAL LOW (ref 3.87–5.11)
RDW: 17.2 % — ABNORMAL HIGH (ref 11.5–15.5)
WBC: 4.6 10*3/uL (ref 4.0–10.5)
nRBC: 0 % (ref 0.0–0.2)

## 2021-11-23 LAB — BASIC METABOLIC PANEL
Anion gap: 8 (ref 5–15)
BUN: 61 mg/dL — ABNORMAL HIGH (ref 8–23)
CO2: 16 mmol/L — ABNORMAL LOW (ref 22–32)
Calcium: 6.9 mg/dL — ABNORMAL LOW (ref 8.9–10.3)
Chloride: 118 mmol/L — ABNORMAL HIGH (ref 98–111)
Creatinine, Ser: 4.36 mg/dL — ABNORMAL HIGH (ref 0.44–1.00)
GFR, Estimated: 10 mL/min — ABNORMAL LOW (ref >60)
Glucose, Bld: 75 mg/dL (ref 70–99)
Potassium: 4.1 mmol/L (ref 3.5–5.1)
Sodium: 142 mmol/L (ref 135–145)

## 2021-11-23 LAB — MAGNESIUM: Magnesium: 2.9 mg/dL — ABNORMAL HIGH (ref 1.7–2.4)

## 2021-11-23 MED ORDER — LORATADINE 10 MG PO TABS: 10.0000 mg | ORAL_TABLET | Freq: Every day | ORAL | Status: DC | PRN

## 2021-11-23 MED ORDER — HYDRALAZINE HCL 10 MG PO TABS: 10.0000 mg | ORAL_TABLET | Freq: Three times a day (TID) | ORAL | Status: DC

## 2021-11-23 MED ORDER — ENSURE ENLIVE PO LIQD: 237.0000 mL | Freq: Two times a day (BID) | ORAL | Status: DC

## 2021-11-23 MED ORDER — SODIUM BICARBONATE 650 MG PO TABS: 650.0000 mg | ORAL_TABLET | Freq: Three times a day (TID) | ORAL | Status: DC

## 2021-11-23 MED ORDER — FUROSEMIDE 40 MG PO TABS: 40.0000 mg | ORAL_TABLET | Freq: Every day | ORAL | 3 refills | Status: DC | PRN

## 2021-11-23 MED ORDER — ADULT MULTIVITAMIN W/MINERALS CH: 1.0000 | ORAL_TABLET | Freq: Every day | ORAL | Status: DC

## 2021-11-23 MED ORDER — BACITRACIN-POLYMYXIN B 500-10000 UNIT/GM OP OINT: TOPICAL_OINTMENT | Freq: Every day | OPHTHALMIC | 0 refills | Status: AC

## 2021-11-23 MED ORDER — ONDANSETRON HCL 4 MG PO TABS: 4.0000 mg | ORAL_TABLET | Freq: Four times a day (QID) | ORAL | 0 refills | Status: DC | PRN

## 2021-11-23 NOTE — Progress Notes (Signed)
Occupational Therapy Treatment Patient Details Name: Kara Mullins MRN: [id number] DOB: 1951-07-31 Today's Date: 11/23/2021   History of present illness 71 y.o. female with medical history significant of chronic combined systolic and diastolic heart failure, generalized anxiety disorder, hyperlipidemia, hypertension, stage IIIb CKD who was brought to the emergency department due to inability to swallow less than an hour before arrival. While in ED, pt had episode of syncope, found to be hypothermic and evidence suggesting UTI. Dx of sepsis.   OT comments  Patient has made great improvement with mobility and functional transfers. Patient min G assist to stand from edge of bed, recliner and bedside commode. While practicing toilet transfer to [facility] patient report not needing to use, however upon standing had a posterior loss of balance needing min A to stabilize. Patient reports needing to sit back onto 3N1 and was incontinent of stool. Patient min G to transfer on/off commode second time and total A for peri care as patient needing to maintain bilateral upper extremity support on walker in standing. Spoke with patient and CNA to encourage use of 3N1 for toileting vs bed pan/purewick to maximize patient functional strength and progress independence with self care.    Recommendations for follow up therapy are one component of a multi-disciplinary discharge planning process, led by the attending physician.  Recommendations may be updated based on patient status, additional functional criteria and insurance authorization.    Follow Up Recommendations  Skilled nursing-short term rehab (<3 hours/day)    Assistance Recommended at Discharge Frequent or constant Supervision/Assistance  Patient can return home with the following  A little help with walking and/or transfers;A lot of help with bathing/dressing/bathroom   Equipment Recommendations  BSC/3in1       Precautions / Restrictions  Precautions Precautions: Fall Restrictions Weight Bearing Restrictions: No       Mobility Bed Mobility Overal bed mobility: Modified Independent Bed Mobility: Supine to Sit     Supine to sit: Modified independent (Device/Increase time), HOB elevated             Balance Overall balance assessment: Needs assistance Sitting-balance support: Feet supported Sitting balance-Leahy Scale: Fair   Postural control: Posterior lean Standing balance support: Bilateral upper extremity supported Standing balance-Leahy Scale: Poor                             ADL either performed or assessed with clinical judgement   ADL Overall ADL's : Needs assistance/impaired                         Toilet Transfer: Min guard;Minimal assistance;Ambulation;BSC/3in1;Rolling walker (2 wheels) Toilet Transfer Details (indicate cue type and reason): Patient initially min G assist with transfer to bedside commode. Patient initially stating she does not have to go and was min G to power up to standing. Upon standing patient has posterior loss of balance needing min A to steady and has loose bowel movement onto floor. Toileting- Clothing Manipulation and Hygiene: Total assistance;Sit to/from stand Toileting - Clothing Manipulation Details (indicate cue type and reason): For safety with balance had patient maintain B UE support on walker during perianal care     Functional mobility during ADLs: Min guard;Minimal assistance;Cueing for safety;Rolling walker (2 wheels) General ADL Comments: Patient has progressed well with functional mobility needing +1 assistance this date with [facility] transfers. Patient with x1 posterior loss of balance with walker coming up off the floor needing min  A to steady      Cognition Arousal/Alertness: Awake/alert Behavior During Therapy: WFL for tasks assessed/performed Overall Cognitive Status: Within Functional Limits for tasks assessed                                  General Comments: Patient very pleasant and cooperative                   Pertinent Vitals/ Pain       Pain Assessment Pain Assessment: No/denies pain         Frequency  Min 2X/week        Progress Toward Goals  OT Goals(current goals can now be found in the care plan section)  Progress towards OT goals: Progressing toward goals  Acute Rehab OT Goals Patient Stated Goal: Get my legs stronger OT Goal Formulation: With patient Time For Goal Achievement: 11/25/21 Potential to Achieve Goals: Good ADL Goals Pt Will Perform Lower Body Dressing: with min assist;sit to/from stand;sitting/lateral leans Pt Will Transfer to Toilet: with min guard assist;ambulating;bedside commode (walker) Pt Will Perform Toileting - Clothing Manipulation and hygiene: with min assist;sitting/lateral leans;sit to/from stand  Plan Discharge plan remains appropriate       AM-PAC OT "6 Clicks" Daily Activity     Outcome Measure   Help from another person eating meals?: A Little Help from another person taking care of personal grooming?: A Little Help from another person toileting, which includes using toliet, bedpan, or urinal?: A Lot Help from another person bathing (including washing, rinsing, drying)?: A Lot Help from another person to put on and taking off regular upper body clothing?: A Little Help from another person to put on and taking off regular lower body clothing?: A Lot 6 Click Score: 15    End of Session Equipment Utilized During Treatment: Gait belt;Rolling walker (2 wheels)  OT Visit Diagnosis: Unsteadiness on feet (R26.81);Muscle weakness (generalized) (M62.81)   Activity Tolerance Patient tolerated treatment well   Patient Left in chair;with call bell/phone within reach;with family/visitor present   Nurse Communication Mobility status        Time: 5520-8022 OT Time Calculation (min): 32 min  Charges: OT General Charges $OT Visit: 1  Visit OT Treatments $Self Care/Home Management : 23-37 mins  Delbert Phenix OT OT pager: [place] 11/23/2021, 1:26 PM

## 2021-11-23 NOTE — TOC Transition Note (Addendum)
Transition of Care [facility]) - CM/SW Discharge Note   Patient Details  Name: Kara Mullins MRN: [id number] Date of Birth: [birth year]  [facility] CM/SW Contact:  Trish Mage, LCSW Phone Number: 11/23/2021, 2:56 PM   Clinical Narrative:   Patient who is stable for d/c will transfer to Kara Mullins today.  Family informed.  PTAR informed. Nursing, please call report to [phone]. TOC sign off.  Addendum: Contacted [facility] for outpatient palliative referral per MD request.    Final next level of care: Skilled Nursing Facility Barriers to Discharge: Barriers Resolved   Patient Goals and CMS Choice Patient states their goals for this hospitalization and ongoing recovery are:: go to rehab   Choice offered to / list presented to : Patient, Adult Children  Discharge Placement                       Discharge Plan and Services   Discharge Planning Services: CM Consult Post Acute Care Choice: [place]                               Social Determinants of Health (SDOH) Interventions     Readmission Risk Interventions No flowsheet data found.

## 2021-11-23 NOTE — Progress Notes (Signed)
Daily Progress Note   Patient Name: Kara Mullins       Date: 11/23/2021 DOB: [birth year]  Age: 71 y.o. MRN#: [id number] Attending Physician: Flora Lipps, MD Primary Care Physician: [name], NP Admit Date: 11/08/2021  Reason for Consultation/Follow-up: Establishing goals of care  Subjective: I met today with Kara Mullins, her daughter, and her son in law.  We discussed again regarding her multiple comorbidities and natural progression of disease.  Discussed desire for rehab to try to regain as much functional status as possible.  Length of Stay: 14  Current Medications: Scheduled Meds:   bacitracin-polymyxin b   Left Eye QHS   carvedilol  6.25 mg Oral BID WC   feeding supplement  237 mL Oral BID BM   hydrALAZINE  10 mg Oral Q8H   mouth rinse  15 mL Mouth Rinse BID   multivitamin with minerals  1 tablet Oral Daily   rosuvastatin  10 mg Oral Daily   sodium bicarbonate  650 mg Oral TID    Continuous Infusions:  sodium chloride Stopped (11/17/21 1145)    PRN Meds: sodium chloride, acetaminophen **OR** acetaminophen, HYDROmorphone (DILAUDID) injection, lip balm, loratadine, ondansetron **OR** ondansetron (ZOFRAN) IV  Physical Exam         Sitting in bedside chair in no distress eating lunch No respiratory distress S1-S2 Trace edema No focal deficits Abdomen not tender  Vital Signs: BP 125/61 (BP Location: Left Arm)    Pulse 65    Temp 98.8 F (37.1 C) (Oral)    Resp 18    Ht _0  (1.651 m)    Wt 81 kg Comment: 1 pillow, 1 sheet, 1 blanket   SpO2 99%    BMI 29.72 kg/m  SpO2: SpO2: 99 % O2 Device: O2 Device: Room Air O2 Flow Rate:    Intake/output summary:  Intake/Output Summary (Last 24 hours) at 11/23/2021 0935 Last data filed at 11/23/2021  0548 Gross per 24 hour  Intake --  Output 500 ml  Net -500 ml    LBM: Last BM Date: 11/21/21 Baseline Weight: Weight: 79.8 kg Most recent weight: Weight: 81 kg (1 pillow, 1 sheet, 1 blanket)       Palliative Assessment/Data:      Patient Active Problem List   Diagnosis Date Noted   Debility 11/20/2021  Metabolic acidosis 32/11/3341   Severe sepsis ([place]) 11/19/2021   Hypothermia 11/09/2021   Hyperlipidemia 11/09/2021   Chronic combined systolic and diastolic congestive heart failure ([place]) 11/09/2021   Stage 3b chronic kidney disease (CKD) ([place]) 11/09/2021   Pancytopenia ([place]) 11/09/2021   Severe protein-calorie malnutrition ([place]) 11/09/2021   Acute combined systolic and diastolic HF (heart failure) ([facility])    AKI (acute kidney injury) ([place])    CHF exacerbation ([place]) 03/06/2021   Essential hypertension    Hypertensive urgency 03/05/2021   Acute CHF (congestive heart failure) ([place]) 03/05/2021   Nausea and vomiting 03/05/2021   Elevated troponin level not due myocardial infarction 03/05/2021   Elevated d-dimer 03/05/2021   Generalized anxiety disorder 03/05/2021   Acute congestive heart failure ([place]) 03/05/2021    Palliative Care Assessment & Plan   Patient Profile:  71 year old lady with chronic combined systolic and diastolic heart failure, generalized anxiety disorder, dyslipidemia, hypertension, stage III chronic kidney disease brought into the emergency department because of difficulty swallowing, during evaluation in the emergency department was found to have episode of syncope, hypothermic, admitted for acute kidney injury and urinary tract infection. Patient was placed on antibiotics, was seen and evaluated by renal services. Patient began declining lab work and oral medications, requested for comfort care.  Patient expressed her wishes for not undergoing current scope of hospitalization.  Patient's care plan was reflected to comfort measures  which expressed her wishes and palliative consult was placed.  Assessment: As per patient's previously expressed wishes, she was switched to comfort measures, transferred out of the ICU, medications were discontinued at her request and patient was transferred to the fifth floor.  In a.m. on 11-19-2021, patient expressed a preference for discontinuing comfort measures and that she wished to have some medical treatment. I arrived at bedside on 12-09-2021 and another family meeting was held, see below.  Recommendations/Plan: - Continue DNR/DNI - Confirmed desire for SNF for trial of rehab.  Recommend she be followed as an outpatient by palliative care. - Her daughter requested completion of short term FMLA forms.  Completed and returned to her today. Code Status:    Code Status Orders  (From admission, onward)           Start     Ordered   11/17/21 1742  Do not attempt resuscitation (DNR)  Continuous       Question Answer Comment  In the event of cardiac or respiratory ARREST Do not call a code blue   In the event of cardiac or respiratory ARREST Do not perform Intubation, CPR, defibrillation or ACLS   In the event of cardiac or respiratory ARREST Use medication by any route, position, wound care, and other measures to relive pain and suffering. May use oxygen, suction and manual treatment of airway obstruction as needed for comfort.      11/17/21 1741           Code Status History     Date Active Date Inactive Code Status Order ID Comments User Context   11/09/2021 0734 11/17/2021 1741 Full Code [id number]  Reubin Milan, MD Inpatient   03/05/2021 0735 03/15/2021 1931 Full Code [id number]  Vernelle Emerald, MD ED       Prognosis:  Unable to determine  Discharge Planning: To Be Determined  Care plan was discussed with  patient, daughter  Also discussed with RN and TRH MD.   Thank you for allowing the Palliative Medicine Team to assist in the care of this  patient. Micheline Rough, MD  Please contact Palliative Medicine Team phone at [phone] for questions and concerns.

## 2021-11-23 NOTE — Discharge Summary (Signed)
Physician Discharge Summary   Patient: Kara Mullins MRN: [id number] DOB: 1951-05-17  Admit date:     11/08/2021  Discharge date: 11/23/21  Discharge Physician: Flora Lipps   PCP: [name], NP   Recommendations at discharge:   Follow-up with the primary care provider at the skilled nursing facility in 3 to 5 days check blood work with CBC BMP LFT magnesium at that time. Patient does have advanced kidney disease and not a good candidate for dialysis.  Does not have uremic symptoms at this time. Would recommend renal diet with fluid restriction 1200 mL/day.  On dysphagia 3/soft diet.  Discharge Diagnoses: Principal Problem:   AKI (acute kidney injury) ([place]) Active Problems:   Stage 3b chronic kidney disease (CKD) (HCC)   Severe sepsis (HCC)   Metabolic acidosis   Debility   Essential hypertension   Hypothermia   Hyperlipidemia   Chronic combined systolic and diastolic congestive heart failure (HCC)   Pancytopenia (HCC)   Severe protein-calorie malnutrition (HCC)  Resolved Problems:   * No resolved hospital problems. *   Hospital Course: 71 y.o. female with past medical history significant of chronic combined systolic and diastolic heart failure, generalized anxiety disorder, hyperlipidemia, hypertension, stage IIIb CKD was brought into the hospital with dysphagia. While in ED, patient had a stable vitals except for hypothermia but had an episode of syncope and urinalysis suggesting UTI.  Chest x-ray showed cardiomegaly.  CT head was unremarkable.  Patient was given IV fluids, antibiotic and was admitted to the hospital for further evaluation and treatment.    During hospitalization, patient was seen by nephrology.  Patient is a poor candidate for dialysis and due to general health decline, decision was not to proceed with further aggressive treatment.  At this time, patient is stable for disposition to skilled nursing facility.  Assessment and Plan: * AKI (acute  kidney injury) ([place])- (present on admission) Baseline creatinine ranging from 1.3-1.6.  Has history of CKD stage IIIb.  Nephrology followed the patient during hospitalization.  Latest creatinine elevated at 4.3 from 4.7<4.5 <4.5.  We will continue to monitor BMP.  There is concern for plasma cell dyscrasia causing renal failure.  Patient is not a good candidate for hemodialysis or further aggressive intervention.  This was discussed with nephrology team during hospitalization.  Stage 3b chronic kidney disease (CKD) ([place])- (present on admission)  Nonuremic at this time.  Seen by nephrology during hospitalization.  Not a candidate for dialysis  Severe sepsis ([place])- (present on admission) Resolved at this time.  Secondary to UTI.  Completed 7-day course of meropenem during hospitalization.  Blood cultures were negative.    Metabolic acidosis Nonanion gap metabolic acidosis secondary to AKI on CKD and possible plasma cell dyscrasia.    Continue sodium bicarbonate as per nephrology.  Goal CO2 of 20-26  Debility Physical therapy has seen the patient at this time and recommend skilled nursing facility placement on discharge  Severe protein-calorie malnutrition ([place])- (present on admission)  Dysphagia 3 diet as per speech therapy.  Continue Ensure.  Pancytopenia ([place])- (present on admission) Improved.  Hemoglobin low at 7.6 but WBC 4.6 and platelet 210  Chronic combined systolic and diastolic congestive heart failure ([place])- (present on admission) Has chronic pedal edema.  No decompensation at this time.  Chest x-ray without evidence of vascular congestion.  Lasix losartan spironolactone were on hold due to acute kidney injury.  We will continue to hold losartan and spironolactone on discharge but resume Lasix daily as needed  for edema and weight gain.  Hyperlipidemia- (present on admission) On Crestor.  Hypothermia- (present on admission) Secondary to infection.  Improved.  Essential  hypertension- (present on admission) Coreg and low-dose hydralazine on discharge.  Hold her losartan and spironolactone.   Consultants:  Palliative care Nephrology   Procedures performed: Urethral catheter placement and removal Disposition: Skilled nursing facility Diet recommendation:  Renal diet, fluid restriction 1200 MLS per day  DISCHARGE MEDICATION: Allergies as of 11/23/2021       Reactions   Chlorhexidine         Medication List     STOP taking these medications    losartan 25 MG tablet Commonly known as: COZAAR   spironolactone 25 MG tablet Commonly known as: ALDACTONE       TAKE these medications    bacitracin-polymyxin b ophthalmic ointment Commonly known as: POLYSPORIN Place into the left eye at bedtime for 7 days. apply to eye every 12 hours while awake   carvedilol 6.25 MG tablet Commonly known as: COREG Take 1 tablet (6.25 mg total) by mouth 2 (two) times daily with a meal.   feeding supplement Liqd Take 237 mLs by mouth 2 (two) times daily between meals.   furosemide 40 MG tablet Commonly known as: LASIX Take 1 tablet (40 mg total) by mouth daily as needed for edema or fluid. What changed:  when to take this reasons to take this   hydrALAZINE 10 MG tablet Commonly known as: APRESOLINE Take 1 tablet (10 mg total) by mouth every 8 (eight) hours.   Iron (Ferrous Sulfate) 325 (65 Fe) MG Tabs Take 325 mg by mouth once for 1 dose.   loratadine 10 MG tablet Commonly known as: CLARITIN Take 1 tablet (10 mg total) by mouth daily as needed for itching.   multivitamin with minerals Tabs tablet Take 1 tablet by mouth daily. Start taking on: November 24, 2021   ondansetron 4 MG tablet Commonly known as: ZOFRAN Take 1 tablet (4 mg total) by mouth every 6 (six) hours as needed for nausea or vomiting.   rosuvastatin 10 MG tablet Commonly known as: CRESTOR Take 1 tablet (10 mg total) by mouth daily.   sodium bicarbonate 650 MG tablet Take  1 tablet (650 mg total) by mouth 3 (three) times daily.         Contact information for after-discharge care     Destination     [facility] Preferred SNF .   Service: Skilled Nursing Contact information: [place] [phone]                    Subjective Patient was seen and examined bedside.  Feels okay.  Complains of mild swelling in the legs.  No nausea vomiting.  Spoke with the patient's daughter regarding plan for disposition.  Discharge Exam: Filed Weights   11/09/21 0330 11/13/21 1240  Weight: 79.8 kg 81 kg   Vitals with BMI 11/23/2021 11/23/2021 11/23/2021  Height - - -  Weight - - -  BMI - - -  Systolic 875 97 643  Diastolic 69 54 61  Pulse 57 68 65    General:  Average built, not in obvious distress HENT:   No scleral pallor or icterus noted. Oral mucosa is moist.  Chest:  Clear breath sounds.  Diminished breath sounds bilaterally. No crackles or wheezes.  CVS: S1 &S2 heard. No murmur.  Regular rate and rhythm. Abdomen: Soft, nontender, nondistended.  Bowel  sounds are heard.   Extremities: No cyanosis, clubbing with bilateral lower extremity edema.  Peripheral pulses are palpable. Psych: Alert, awake and oriented, normal mood CNS:  No cranial nerve deficits.  Power equal in all extremities.   Skin: Warm and dry.  No rashes noted.  Condition at discharge: good  The results of significant diagnostics from this hospitalization (including imaging, microbiology, ancillary and laboratory) are listed below for reference.   Imaging Studies: CT Head Wo Contrast  Result Date: 11/09/2021 CLINICAL DATA:  Mental status change. EXAM: CT HEAD WITHOUT CONTRAST TECHNIQUE: Contiguous axial images were obtained from the base of the skull through the vertex without intravenous contrast. RADIATION DOSE REDUCTION: This exam was performed according to the departmental dose-optimization program which includes  automated exposure control, adjustment of the mA and/or kV according to patient size and/or use of iterative reconstruction technique. COMPARISON:  None. FINDINGS: Brain: No evidence of acute infarction, hemorrhage, hydrocephalus, extra-axial collection or mass lesion/mass effect. Vascular: No hyperdense vessel or unexpected calcification. Skull: Normal. Negative for fracture or focal lesion. Sinuses/Orbits: No acute finding. Other: None. IMPRESSION: No acute intracranial abnormality. Electronically Signed   By: Ronney Asters M.D.   On: 11/09/2021 01:06   MR BRAIN WO CONTRAST  Result Date: 11/09/2021 CLINICAL DATA:  Mental status change, unknown cause; dysphagia and motor aphasia EXAM: MRI HEAD WITHOUT CONTRAST TECHNIQUE: Multiplanar, multiecho pulse sequences of the brain and surrounding structures were obtained without intravenous contrast. COMPARISON:  None. FINDINGS: Brain: There is no acute infarction or intracranial hemorrhage. There is no intracranial mass, mass effect, or edema. There is no hydrocephalus or extra-axial fluid collection. Ventricles and sulci are normal in size and configuration. Minimal small foci of T2 hyperintensity in the supratentorial white matter may reflect minor chronic microvascular ischemic changes. Vascular: Major vessel flow voids at the skull base are preserved. Skull and upper cervical spine: Normal marrow signal is preserved. Sinuses/Orbits: Paranasal sinuses are aerated. Bilateral lens replacements. Other: Sella is unremarkable.  Mastoid air cells are clear. IMPRESSION: No acute infarction, hemorrhage, or mass. Electronically Signed   By: Macy Mis M.D.   On: 11/09/2021 14:43   US RENAL  Result Date: 11/10/2021 CLINICAL DATA:  Acute renal failure. EXAM: RENAL / URINARY TRACT ULTRASOUND COMPLETE COMPARISON:  CT renal 03/08/2021, chest x-ray 11/09/2021 FINDINGS: Right Kidney: Renal measurements: 12 x 4.7 x 4.3 cm = volume: 126 mL. Echogenicity within normal limits.  No mass. Persistent mild hydronephrosis visualized. Left Kidney: Renal measurements: 12.2 x 6.6 x 4.8 cm = volume: 190 mL. Echogenicity within normal limits. No mass. Persistent moderate hydronephrosis visualized. Query parapelvic cysts versus more likely dilated renal pelvis. Urinary bladder: Layering debris within the urinary bladder lumen. Otherwise appears normal for degree of bladder distention. Other: At least moderate right and small left pleural effusions. There is a 4.6 x 4.2 x 5 cm cystic lesion within the pelvis posterior to the urinary bladder. IMPRESSION: 1. Persistent mild right hydronephrosis. 2. Persistent moderate left hydronephrosis. 3. A 4.6 x 4.2 x 5 cm cystic lesion within the pelvis posterior to the urinary bladder which may correlate to a prior identified left adnexal cyst - please see pelvic ultrasound 03/08/2021 for further details and consider follow-up pelvic ultrasound as per recommendations on this prior study. 4. At least moderate right and small left pleural effusions. Electronically Signed   By: Iven Finn M.D.   On: 11/10/2021 17:04   DG CHEST PORT 1 VIEW  Result Date: 11/13/2021 CLINICAL DATA:  Sepsis.  EXAM: PORTABLE CHEST 1 VIEW COMPARISON:  November 09, 2021. FINDINGS: Stable cardiomegaly with mild central pulmonary vascular congestion. Small bilateral pleural effusions are noted. Possible bibasilar pulmonary edema is noted. The visualized skeletal structures are unremarkable. IMPRESSION: Stable cardiomegaly with mild central pulmonary vascular congestion and possible bibasilar pulmonary edema and small pleural effusions. Electronically Signed   By: Marijo Conception M.D.   On: 11/13/2021 12:57   DG Chest Port 1 View  Result Date: 11/09/2021 CLINICAL DATA:  Weakness. EXAM: PORTABLE CHEST 1 VIEW COMPARISON:  Chest CT dated 03/05/2021. FINDINGS: Cardiomegaly with vascular congestion, edema and small bilateral pleural effusions. Pneumonia is not excluded. No pneumothorax.  Atherosclerotic calcification of the aorta. No acute osseous pathology. IMPRESSION: Cardiomegaly with findings of CHF and small bilateral pleural effusions. Pneumonia is not excluded. Electronically Signed   By: Anner Crete M.D.   On: 11/09/2021 00:36   ECHOCARDIOGRAM COMPLETE  Result Date: 11/10/2021    ECHOCARDIOGRAM REPORT   Patient Name:   SHADAI MCCLANE Date of Exam: 11/10/2021 Medical Rec #:  [id number]      Height:       65.0 in Accession #:    [id number]     Weight:       175.9 lb Date of Birth:  05/14/1951     BSA:          1.873 m Patient Age:    12 years       BP:           179/86 mmHg Patient Gender: F              HR:           50 bpm. Exam Location:  Inpatient Procedure: 2D Echo, 3D Echo, Cardiac Doppler, Color Doppler and Strain Analysis Indications:    Syncope R55  History:        Patient has prior history of Echocardiogram examinations, most                 recent 03/05/2021. CHF; Risk Factors:Hypertension and                 Dyslipidemia. Chronic kidney disease.  Sonographer:    Darlina Sicilian RDCS Referring Phys: [id number] DAVID MANUEL [place]  1. Left ventricular ejection fraction, by estimation, is 55 to 60%. The left ventricle has normal function. The left ventricle has no regional wall motion abnormalities. There is moderate left ventricular hypertrophy. Left ventricular diastolic parameters are consistent with Grade II diastolic dysfunction (pseudonormalization). Elevated left atrial pressure.  2. Right ventricular systolic function is normal. The right ventricular size is normal. There is normal pulmonary artery systolic pressure.  3. Left atrial size was mildly dilated.  4. Moderate pleural effusion in the left lateral region.  5. The mitral valve is abnormal. Mild mitral valve regurgitation. No evidence of mitral stenosis.  6. The tricuspid valve is abnormal.  7. The aortic valve is tricuspid. There is mild calcification of the aortic valve. There is mild thickening of the  aortic valve. Aortic valve regurgitation is mild. No aortic stenosis is present.  8. The inferior vena cava is normal in size with greater than 50% respiratory variability, suggesting right atrial pressure of 3 mmHg. FINDINGS  Left Ventricle: Left ventricular ejection fraction, by estimation, is 55 to 60%. The left ventricle has normal function. The left ventricle has no regional wall motion abnormalities. Global longitudinal strain performed but not reported based on interpreter judgement due to suboptimal tracking. The  left ventricular internal cavity size was normal in size. There is moderate left ventricular hypertrophy. Left ventricular diastolic parameters are consistent with Grade II diastolic dysfunction (pseudonormalization). Elevated left atrial pressure. Right Ventricle: The right ventricular size is normal. No increase in right ventricular wall thickness. Right ventricular systolic function is normal. There is normal pulmonary artery systolic pressure. The tricuspid regurgitant velocity is 2.42 m/s, and  with an assumed right atrial pressure of 3 mmHg, the estimated right ventricular systolic pressure is 29.9 mmHg. Left Atrium: Left atrial size was mildly dilated. Right Atrium: Right atrial size was normal in size. Pericardium: There is no evidence of pericardial effusion. Mitral Valve: The mitral valve is abnormal. There is mild thickening of the mitral valve leaflet(s). There is mild calcification of the mitral valve leaflet(s). Mild mitral annular calcification. Mild mitral valve regurgitation. No evidence of mitral valve stenosis. Tricuspid Valve: The tricuspid valve is abnormal. Tricuspid valve regurgitation is mild . No evidence of tricuspid stenosis. Aortic Valve: The aortic valve is tricuspid. There is mild calcification of the aortic valve. There is mild thickening of the aortic valve. There is mild aortic valve annular calcification. Aortic valve regurgitation is mild. Aortic regurgitation PHT  measures 945 msec. No aortic stenosis is present. Aortic valve mean gradient measures 5.1 mmHg. Aortic valve peak gradient measures 12.1 mmHg. Aortic valve area, by VTI measures 1.73 cm. Pulmonic Valve: The pulmonic valve was not well visualized. Pulmonic valve regurgitation is trivial. No evidence of pulmonic stenosis. Aorta: The aortic root is normal in size and structure. Venous: The inferior vena cava is normal in size with greater than 50% respiratory variability, suggesting right atrial pressure of 3 mmHg. IAS/Shunts: No atrial level shunt detected by color flow Doppler. Additional Comments: There is a moderate pleural effusion in the left lateral region. Moderate ascites is present.  LEFT VENTRICLE PLAX 2D LVIDd:         5.00 cm      Diastology LVIDs:         3.30 cm      LV e' medial:    2.93 cm/s LV PW:         1.40 cm      LV E/e' medial:  26.8 LV IVS:        1.40 cm      LV e' lateral:   3.38 cm/s LVOT diam:     1.90 cm      LV E/e' lateral: 23.2 LV SV:         78 LV SV Index:   41 LVOT Area:     2.84 cm                              3D Volume EF: LV Volumes (MOD)            3D EF:        54 % LV vol d, MOD A2C: 157.0 ml LV EDV:       178 ml LV vol d, MOD A4C: 144.0 ml LV ESV:       82 ml LV vol s, MOD A2C: 69.4 ml  LV SV:        96 ml LV vol s, MOD A4C: 71.2 ml LV SV MOD A2C:     87.6 ml LV SV MOD A4C:     144.0 ml LV SV MOD BP:      82.2 ml RIGHT VENTRICLE RV S  prime:     10.60 cm/s TAPSE (M-mode): 2.2 cm LEFT ATRIUM             Index        RIGHT ATRIUM           Index LA diam:        3.40 cm 1.82 cm/m   RA Area:     14.40 cm LA Vol (A2C):   74.9 ml 39.99 ml/m  RA Volume:   33.00 ml  17.62 ml/m LA Vol (A4C):   76.7 ml 40.95 ml/m LA Biplane Vol: 76.5 ml 40.84 ml/m  AORTIC VALVE AV Area (Vmax):    1.78 cm AV Area (Vmean):   1.88 cm AV Area (VTI):     1.73 cm AV Vmax:           173.74 cm/s AV Vmean:          101.014 cm/s AV VTI:            0.448 m AV Peak Grad:      12.1 mmHg AV Mean Grad:       5.1 mmHg LVOT Vmax:         109.00 cm/s LVOT Vmean:        66.900 cm/s LVOT VTI:          0.274 m LVOT/AV VTI ratio: 0.61 AI PHT:            945 msec  AORTA Ao Root diam: 3.60 cm Ao Asc diam:  3.00 cm MITRAL VALVE               TRICUSPID VALVE MV Area (PHT):  4.60 cm   TR Peak grad:   23.4 mmHg MV Area (plan): 6.97 cm   TR Vmax:        242.00 cm/s MV Decel Time:  165 msec MV E velocity: 78.40 cm/s  SHUNTS MV A velocity: 66.40 cm/s  Systemic VTI:  0.27 m MV E/A ratio:  1.18        Systemic Diam: 1.90 cm Carlyle Dolly MD Electronically signed by Carlyle Dolly MD Signature Date/Time: 11/10/2021/1:30:51 PM    Final     Microbiology: Results for orders placed or performed during the hospital encounter of 11/08/21  Resp Panel by RT-PCR (Flu A&B, Covid) Nasopharyngeal Swab     Status: None   Collection Time: 11/09/21 12:13 AM   Specimen: Nasopharyngeal Swab; Nasopharyngeal(NP) swabs in vial transport medium  Result Value Ref Range Status   SARS Coronavirus 2 by RT PCR NEGATIVE NEGATIVE Final    Comment: (NOTE) SARS-CoV-2 target nucleic acids are NOT DETECTED.  The SARS-CoV-2 RNA is generally detectable in upper respiratory specimens during the acute phase of infection. The lowest concentration of SARS-CoV-2 viral copies this assay can detect is 138 copies/mL. A negative result does not preclude SARS-Cov-2 infection and should not be used as the sole basis for treatment or other patient management decisions. A negative result may occur with  improper specimen collection/handling, submission of specimen other than nasopharyngeal swab, presence of viral mutation(s) within the areas targeted by this assay, and inadequate number of viral copies(<138 copies/mL). A negative result must be combined with clinical observations, patient history, and epidemiological information. The expected result is Negative.  Fact Sheet for Patients:  EntrepreneurPulse.com.au  Fact Sheet for  Healthcare Providers:  IncredibleEmployment.be  This test is no t yet approved or cleared by the [place] FDA and  has been authorized for detection and/or diagnosis of  SARS-CoV-2 by FDA under an Emergency Use Authorization (EUA). This EUA will remain  in effect (meaning this test can be used) for the duration of the COVID-19 declaration under Section 564(b)(1) of the Act, 21 U.S.C.section 360bbb-3(b)(1), unless the authorization is terminated  or revoked sooner.       Influenza A by PCR NEGATIVE NEGATIVE Final   Influenza B by PCR NEGATIVE NEGATIVE Final    Comment: (NOTE) The Xpert Xpress SARS-CoV-2/FLU/RSV plus assay is intended as an aid in the diagnosis of influenza from Nasopharyngeal swab specimens and should not be used as a sole basis for treatment. Nasal washings and aspirates are unacceptable for Xpert Xpress SARS-CoV-2/FLU/RSV testing.  Fact Sheet for Patients: EntrepreneurPulse.com.au  Fact Sheet for Healthcare Providers: IncredibleEmployment.be  This test is not yet approved or cleared by the [place] FDA and has been authorized for detection and/or diagnosis of SARS-CoV-2 by FDA under an Emergency Use Authorization (EUA). This EUA will remain in effect (meaning this test can be used) for the duration of the COVID-19 declaration under Section 564(b)(1) of the Act, 21 U.S.C. section 360bbb-3(b)(1), unless the authorization is terminated or revoked.  Performed at [facility], [place] [address] [place], [place] [address]   MRSA Next Gen by PCR, Nasal     Status: None   Collection Time: 11/09/21  2:29 AM   Specimen: Nasal Mucosa; Nasal Swab  Result Value Ref Range Status   MRSA by PCR Next Gen NOT DETECTED NOT DETECTED Final    Comment: (NOTE) The GeneXpert MRSA Assay (FDA approved for NASAL specimens only), is one component of a comprehensive MRSA colonization  surveillance program. It is not intended to diagnose MRSA infection nor to guide or monitor treatment for MRSA infections. Test performance is not FDA approved in patients less than 42 years old. Performed at [facility], [place] [address] [place], [place] [address]   Urine Culture     Status: Abnormal   Collection Time: 11/09/21  9:44 AM   Specimen: Urine, Clean Catch  Result Value Ref Range Status   Specimen Description   Final    URINE, CLEAN CATCH Performed at [facility], [place] [address] [place], [place] [address]    Special Requests   Final    NONE Performed at [facility], [place] [address] [place], [place] [address]    Culture (A)  Final    <10,000 COLONIES/mL INSIGNIFICANT GROWTH Performed at [place] [address], [place], [place] [address]    Report Status 11/10/2021 FINAL  Final  C Difficile Quick Screen w PCR reflex     Status: None   Collection Time: 11/10/21 11:08 AM   Specimen: STOOL  Result Value Ref Range Status   C Diff antigen NEGATIVE NEGATIVE Final   C Diff toxin NEGATIVE NEGATIVE Final   C Diff interpretation No C. difficile detected.  Final    Comment: Performed at [facility], [place] [address], [place], [place] [address]  Urine Culture     Status: Abnormal   Collection Time: 11/10/21  6:07 PM   Specimen: Urine, Catheterized  Result Value Ref Range Status   Specimen Description   Final    URINE, CATHETERIZED Performed at [place] [address] [place], [place] [address]    Special Requests   Final    NONE Performed at [facility], [place] [address] [place], [place] [address]  Culture 80,000 COLONIES/mL ENTEROBACTER CLOACAE (A)  Final   Report Status 11/13/2021 FINAL  Final   Organism ID, Bacteria ENTEROBACTER CLOACAE (A)  Final      Susceptibility   Enterobacter cloacae - MIC*    CEFAZOLIN >=64 RESISTANT Resistant      CIPROFLOXACIN <=0.25 SENSITIVE Sensitive     GENTAMICIN <=1 SENSITIVE Sensitive     IMIPENEM 0.5 SENSITIVE Sensitive     NITROFURANTOIN 64 INTERMEDIATE Intermediate     TRIMETH/SULFA <=20 SENSITIVE Sensitive     * 80,000 COLONIES/mL ENTEROBACTER CLOACAE  Culture, blood (routine x 2)     Status: None   Collection Time: 11/11/21  8:17 AM   Specimen: BLOOD  Result Value Ref Range Status   Specimen Description   Final    BLOOD BLOOD RIGHT HAND Performed at [place] [address] [place], [place] [address]    Special Requests   Final    BOTTLES DRAWN AEROBIC ONLY Blood Culture adequate volume Performed at [place] [address], [place], [place] [address]    Culture   Final    NO GROWTH 5 DAYS Performed at [facility], [place] [address], [place], [place] [address]    Report Status 11/16/2021 FINAL  Final  Culture, blood (routine x 2)     Status: None   Collection Time: 11/11/21  8:17 AM   Specimen: BLOOD  Result Value Ref Range Status   Specimen Description   Final    BLOOD BLOOD RIGHT HAND Performed at [facility] [address] [place], [place] [address]    Special Requests   Final    BOTTLES DRAWN AEROBIC ONLY Blood Culture adequate volume Performed at [facility] [address] [place], [place] [address]    Culture   Final    NO GROWTH 5 DAYS Performed at [facility], [place] [address] [place], [place] [address]    Report Status 11/16/2021 FINAL  Final    Labs: CBC: Recent Labs  Lab 11/19/21 1615 11/20/21 0510 11/21/21 0451 11/22/21 0439 11/23/21 0519  WBC 5.9 4.9 4.7 4.8 4.6  HGB 8.8* 7.8* 7.5* 7.5* 7.6*  HCT 28.8* 25.3* 24.5* 24.7* 24.6*  MCV 88.6 90.0 90.1 90.1 90.1  PLT 193 170 181 198 407   Basic Metabolic Panel: Recent Labs  Lab 11/19/21 1615 11/20/21 0510 11/21/21 0451 11/22/21 0439 11/23/21 0519  NA 144 143 142 143 142  K 3.9 3.8 3.5 3.8  4.1  CL 118* 119* 115* 119* 118*  CO2 20* 18* 19* 18* 16*  GLUCOSE 125* 137* 108* 99 75  BUN 62* 63* 62* 61* 61*  CREATININE 4.55* 4.51* 4.74* 4.35* 4.36*  CALCIUM 7.7* 7.4* 7.3* 7.2* 6.9*  MG 2.9* 2.8* 2.8* 2.8* 2.9*  PHOS  --  6.8*  --   --   --    Liver Function Tests: Recent Labs  Lab 11/20/21 0510  AST 65*  ALT 27  ALKPHOS 144*  BILITOT 0.4  PROT 4.7*  ALBUMIN 1.7*   CBG: Recent Labs  Lab 11/22/21 1416 11/22/21 1600 11/22/21 1931 11/23/21 0759 11/23/21 1157  GLUCAP 110* 104* 123* 77 94    Discharge time spent: greater than 30 minutes.  Signed: Flora Lipps, MD [facility] 11/23/2021

## 2021-11-23 NOTE — Progress Notes (Signed)
Discharged home via [facility]

## 2021-11-23 NOTE — Progress Notes (Signed)
[facility] ([facility]) Hospital Liaison Note  Notified by TOC manager of patient/family request for [facility] palliative services at home after discharge.   [facility] hospital liaison will follow patient for discharge disposition.   Please call with any hospice or outpatient palliative care related questions.   Thank you for the opportunity to participate in this patient's care.   Shanita Wicker, LCSW [facility] Hospital Liaison [phone]  

## 2021-11-23 NOTE — Progress Notes (Signed)
Speech Language Pathology Treatment: Dysphagia  Patient Details Name: Kara Mullins MRN: [id number] DOB: 02-14-1951 Today's Date: 11/23/2021 Time: 8934-0684 SLP Time Calculation (min) (ACUTE ONLY): 24 min  Assessment / Plan / Recommendation Clinical Impression  Pt today seen to address dysphagia goals. Pt is fully alert, interactive and able to feed herself. She reports her swallowing ability has improved and clinical observations revealed this to be accurate. Pt observed consuming omelet, muffin, applejuice and water. No indication of aspiration with all po noted and much improved efficiency of swallow.  Largest concern pt reports is her leg edema - she reports the fluid restriction has not helped to decrease edema and she questions if she could have neuropathy. SLP advised her to speak to MD regarding this issue.  At this time, pt's dysphagia clinically judged to have resolved. Recommend advance diet to allow thin liquids but maintain dys3 due to lack of dentition. Pt agreeable to plan and using teach back verbalized precautions.  No SLP follow up - Thanks for allowing me to help care for htis most pleasant pt.    HPI HPI: pt is a 71 yo female adm to [facility] with CHF, UTI, CKD - and found to have hypothermia.  Pt with PMH + for COP, anxiety, HLD, HTN.  Pt CXR 11/14/2021 showed possible bibasilar pulmonary edema and small pleural effusios.   Per notes, pt was unable to swallow for one hour prior to admission.  RN reports pt coughing with thin liquids and SLP swallow eval ordered.  Follow up for dysphagia management indicated due to concern for pt's dysphagia.      SLP Plan  All goals met      Recommendations for follow up therapy are one component of a multi-disciplinary discharge planning process, led by the attending physician.  Recommendations may be updated based on patient status, additional functional criteria and insurance authorization.    Recommendations  Diet recommendations: Dysphagia  3 (mechanical soft);Thin liquid Liquids provided via: Cup;Straw;Teaspoon Medication Administration: Other (Comment) (as tolerated) Supervision: Patient able to self feed Compensations: Slow rate;Small sips/bites Postural Changes and/or Swallow Maneuvers: Seated upright 90 degrees                Oral Care Recommendations: Oral care BID Follow Up Recommendations: No SLP follow up Assistance recommended at discharge: None SLP Visit Diagnosis: Dysphagia, unspecified (R13.10) Plan: All goals met          Kathleen Lime, MS [facility] SLP [place] Office [phone] Cell [phone]  Macario Golds  11/23/2021, 10:13 AM

## 2021-11-23 NOTE — Progress Notes (Signed)
Pt being discharged via [facility] to [facility]. Discharge instructions and medication education provided to pt and daughter at bedside and also placed in packet for receiving SNF. RN called report to [place].

## 2021-11-26 ENCOUNTER — Telehealth: Payer: Self-pay | Admitting: *Deleted

## 2021-11-26 DIAGNOSIS — D61818 Other pancytopenia: Secondary | ICD-10-CM | POA: Diagnosis not present

## 2021-11-26 DIAGNOSIS — N1832 Chronic kidney disease, stage 3b: Secondary | ICD-10-CM | POA: Diagnosis not present

## 2021-11-26 DIAGNOSIS — E872 Acidosis, unspecified: Secondary | ICD-10-CM | POA: Diagnosis not present

## 2021-11-26 DIAGNOSIS — E43 Unspecified severe protein-calorie malnutrition: Secondary | ICD-10-CM | POA: Diagnosis not present

## 2021-11-26 DIAGNOSIS — A419 Sepsis, unspecified organism: Secondary | ICD-10-CM | POA: Diagnosis not present

## 2021-11-26 DIAGNOSIS — N390 Urinary tract infection, site not specified: Secondary | ICD-10-CM | POA: Diagnosis not present

## 2021-11-26 DIAGNOSIS — I5042 Chronic combined systolic (congestive) and diastolic (congestive) heart failure: Secondary | ICD-10-CM | POA: Diagnosis not present

## 2021-11-26 DIAGNOSIS — N179 Acute kidney failure, unspecified: Secondary | ICD-10-CM | POA: Diagnosis not present

## 2021-11-26 NOTE — Telephone Encounter (Signed)
Transition Care Management Unsuccessful Follow-up Telephone Call  Date of discharge and from where:  11/23/2021   Attempts:  1st Attempt  Reason for unsuccessful TCM follow-up call:  Unable to reach patient [facility]

## 2021-11-29 DIAGNOSIS — E785 Hyperlipidemia, unspecified: Secondary | ICD-10-CM | POA: Diagnosis not present

## 2021-11-29 DIAGNOSIS — R531 Weakness: Secondary | ICD-10-CM | POA: Diagnosis not present

## 2021-11-29 DIAGNOSIS — I5042 Chronic combined systolic (congestive) and diastolic (congestive) heart failure: Secondary | ICD-10-CM | POA: Diagnosis not present

## 2021-11-29 DIAGNOSIS — N1832 Chronic kidney disease, stage 3b: Secondary | ICD-10-CM | POA: Diagnosis not present

## 2021-11-29 DIAGNOSIS — R609 Edema, unspecified: Secondary | ICD-10-CM | POA: Diagnosis not present

## 2021-11-29 DIAGNOSIS — I10 Essential (primary) hypertension: Secondary | ICD-10-CM | POA: Diagnosis not present

## 2021-12-10 ENCOUNTER — Telehealth: Payer: Self-pay | Admitting: Oncology

## 2021-12-10 DIAGNOSIS — R531 Weakness: Secondary | ICD-10-CM | POA: Diagnosis not present

## 2021-12-10 DIAGNOSIS — J1282 Pneumonia due to coronavirus disease 2019: Secondary | ICD-10-CM | POA: Diagnosis not present

## 2021-12-10 DIAGNOSIS — I5042 Chronic combined systolic (congestive) and diastolic (congestive) heart failure: Secondary | ICD-10-CM | POA: Diagnosis not present

## 2021-12-10 DIAGNOSIS — E43 Unspecified severe protein-calorie malnutrition: Secondary | ICD-10-CM | POA: Diagnosis not present

## 2021-12-10 DIAGNOSIS — E785 Hyperlipidemia, unspecified: Secondary | ICD-10-CM | POA: Diagnosis not present

## 2021-12-10 DIAGNOSIS — N1832 Chronic kidney disease, stage 3b: Secondary | ICD-10-CM | POA: Diagnosis not present

## 2021-12-10 DIAGNOSIS — U071 COVID-19: Secondary | ICD-10-CM | POA: Diagnosis not present

## 2021-12-10 NOTE — Telephone Encounter (Signed)
Scheduled appt per 2/24 referral. Pt is aware of appt date and time. Pt is aware to arrive 15 mins prior to appt time and to bring and updated insurance card. Pt is aware of appt location.   °

## 2021-12-11 ENCOUNTER — Other Ambulatory Visit: Payer: Self-pay

## 2021-12-11 ENCOUNTER — Telehealth: Payer: Self-pay | Admitting: *Deleted

## 2021-12-11 ENCOUNTER — Encounter: Payer: Self-pay | Admitting: Family

## 2021-12-11 ENCOUNTER — Telehealth (INDEPENDENT_AMBULATORY_CARE_PROVIDER_SITE_OTHER): Payer: Medicare (Managed Care) | Admitting: Family

## 2021-12-11 DIAGNOSIS — I5042 Chronic combined systolic (congestive) and diastolic (congestive) heart failure: Secondary | ICD-10-CM | POA: Diagnosis not present

## 2021-12-11 DIAGNOSIS — Z0289 Encounter for other administrative examinations: Secondary | ICD-10-CM | POA: Diagnosis not present

## 2021-12-11 DIAGNOSIS — N184 Chronic kidney disease, stage 4 (severe): Secondary | ICD-10-CM

## 2021-12-11 NOTE — Progress Notes (Signed)
This service is provided via telemedicine  No vital signs collected/recorded due to the encounter was a telemedicine visit.   Location of patient (ex: home, work):  Home  Patient consents to a telephone visit:  Yes  Location of the provider (ex: office, home):  [employer].   Name of any referring provider:  Josealfredo Adkins, [name], NP   Names of all persons participating in the telemedicine service and their role in the encounter:  Patient, Heriberto Antigua, [place], [place] Mel, Webb Silversmith, NP.    Time spent on call: 8 minutes spent on the phone with Medical Assistant.     Provider: Marlowe Sax FNP-C  Chariah Bailey, [name], NP  Patient Care Team: Conleigh Heinlein, [name], NP as PCP - General (Family Medicine) Donato Heinz, MD as PCP - Cardiology (Cardiology)  Extended Emergency Contact Information Primary Emergency Contact: [facility] Phone: [phone] Relation: Daughter  Code Status:  Full Code  Goals of care: Advanced Directive information Advanced Directives 12/11/2021  Does Patient Have a Medical Advance Directive? No  Would patient like information on creating a medical advance directive? No - Patient declined     Chief Complaint  Patient presents with   Acute Visit    Patient daughter states patient is being discharged from skilled nursing. Patient daughter "Kara Mullins" states that she needs FMLA.     HPI:  Pt is a 71 y.o. female seen today for an acute visit for to fill out FMLA paper work follow up skilled Nursing.Patient daughter request FMLA paperwork to be filled to care for her mother who is status post Hospitalization from 11/08/2021 - 11/23/2021 for altered mental status.she was treated for Acute Kidney injury,sepsis due to urinary tract infection and chronic combined systolic and diastolic congestive heart Failure. She was discharged on 11/23/2021 to skilled [facility] due debility. Daughter states patient had COVID-19 infection in SNF but  has improved being discharged home today.  She will continue to need assistance with Her ADL's at home.Daughter request FMLA paper worker starting from 11/28/2021 to care for her mother at home.  She denies any new acute issues today.    Past Medical History:  Diagnosis Date   Chronic combined systolic and diastolic congestive heart failure ([place]) 11/09/2021   Generalized anxiety disorder    Hyperlipidemia 11/09/2021   Hypertension    Stage 3b chronic kidney disease (CKD) ([place]) 11/09/2021   Past Surgical History:  Procedure Laterality Date   CATARACT EXTRACTION, BILATERAL     combined systolic and diastolic congestive heart failure       Allergies  Allergen Reactions   Chlorhexidine     Outpatient Encounter Medications as of 12/11/2021  Medication Sig   carvedilol (COREG) 6.25 MG tablet Take 1 tablet (6.25 mg total) by mouth 2 (two) times daily with a meal.   furosemide (LASIX) 40 MG tablet Take 1 tablet (40 mg total) by mouth daily as needed for edema or fluid.   hydrALAZINE (APRESOLINE) 10 MG tablet Take 1 tablet (10 mg total) by mouth every 8 (eight) hours.   loratadine (CLARITIN) 10 MG tablet Take 1 tablet (10 mg total) by mouth daily as needed for itching.   Multiple Vitamin (MULTIVITAMIN WITH MINERALS) TABS tablet Take 1 tablet by mouth daily.   ondansetron (ZOFRAN) 4 MG tablet Take 1 tablet (4 mg total) by mouth every 6 (six) hours as needed for nausea or vomiting.   sodium bicarbonate 650 MG tablet Take 1 tablet (650 mg total) by mouth 3 (  three) times daily.   [DISCONTINUED] feeding supplement (ENSURE ENLIVE / ENSURE PLUS) LIQD Take 237 mLs by mouth 2 (two) times daily between meals.   [DISCONTINUED] Iron, Ferrous Sulfate, 325 (65 Fe) MG TABS Take 325 mg by mouth once for 1 dose. (Patient not taking: Reported on 11/09/2021)   [DISCONTINUED] rosuvastatin (CRESTOR) 10 MG tablet Take 1 tablet (10 mg total) by mouth daily.   No facility-administered encounter medications on file  as of 12/11/2021.    Review of Systems  Constitutional:  Negative for appetite change, chills, fatigue, fever and unexpected weight change.  HENT:  Negative for congestion, dental problem, ear discharge, ear pain, facial swelling, hearing loss, nosebleeds, postnasal drip, rhinorrhea, sinus pressure, sinus pain, sneezing, sore throat and tinnitus.   Eyes:  Negative for pain, discharge, redness, itching and visual disturbance.  Respiratory:  Negative for cough, chest tightness, shortness of breath and wheezing.   Cardiovascular:  Negative for chest pain, palpitations and leg swelling.  Gastrointestinal:  Negative for abdominal distention, abdominal pain, blood in stool, constipation, diarrhea, nausea and vomiting.  Genitourinary:  Negative for difficulty urinating, dysuria, flank pain, frequency and urgency.  Musculoskeletal:  Positive for gait problem. Negative for arthralgias, back pain, joint swelling, myalgias, neck pain and neck stiffness.  Skin:  Negative for color change, pallor and rash.  Neurological:  Negative for dizziness, syncope, speech difficulty, weakness, light-headedness, numbness and headaches.  Hematological:  Does not bruise/bleed easily.  Psychiatric/Behavioral:  Negative for agitation, behavioral problems, confusion, hallucinations and sleep disturbance. The patient is not nervous/anxious.    Immunization History  Administered Date(s) Administered   PFIZER Comirnaty(Gray Top)Covid-19 Tri-Sucrose Vaccine 04/28/2021   PFIZER(Purple Top)SARS-COV-2 Vaccination 12/16/2019, 01/12/2020   Zoster Recombinat (Shingrix) 07/09/2021   Pertinent  Health Maintenance Due  Topic Date Due   COLONOSCOPY (Pts 45-26yrs Insurance coverage will need to be confirmed)  Never done   MAMMOGRAM  Never done   DEXA SCAN  Never done   INFLUENZA VACCINE  01/11/2022 (Originally 05/14/2021)   Fall Risk 11/21/2021 11/22/2021 11/22/2021 11/23/2021 12/11/2021  Falls in the past year? - - - - 0  Was there an  injury with Fall? - - - - 0  Fall Risk Category Calculator - - - - 0  Fall Risk Category - - - - Low  Patient Fall Risk Level High fall risk High fall risk High fall risk High fall risk Low fall risk  Patient at Risk for Falls Due to - - - - No Fall Risks  Fall risk Follow up - - - - Falls evaluation completed   Functional Status Survey:    There were no vitals filed for this visit. There is no height or weight on file to calculate BMI. Physical Exam Constitutional:      General: She is not in acute distress.    Appearance: She is not diaphoretic.  Pulmonary:     Effort: Pulmonary effort is normal. No respiratory distress.  Neurological:     Mental Status: She is alert and oriented to person, place, and time.    Labs reviewed: Recent Labs    11/15/21 0250 11/16/21 0246 11/20/21 0510 11/21/21 0451 11/22/21 0439 11/23/21 0519  NA 143   < > 143 142 143 142  K 3.5   < > 3.8 3.5 3.8 4.1  CL 121*   < > 119* 115* 119* 118*  CO2 17*   < > 18* 19* 18* 16*  GLUCOSE 120*   < > 137* 108* 99  75  BUN 65*   < > 63* 62* 61* 61*  CREATININE 4.49*   < > 4.51* 4.74* 4.35* 4.36*  CALCIUM 7.2*   < > 7.4* 7.3* 7.2* 6.9*  MG  --    < > 2.8* 2.8* 2.8* 2.9*  PHOS 6.4*  --  6.8*  --   --   --    < > = values in this interval not displayed.   Recent Labs    11/13/21 0300 11/14/21 0233 11/15/21 0250 11/20/21 0510  AST 27 28  --  65*  ALT 15 14  --  27  ALKPHOS 111 107  --  144*  BILITOT 0.5 0.7  --  0.4  PROT 5.5* 4.7*  --  4.7*  ALBUMIN 1.9* 1.7* 1.8* 1.7*   Recent Labs    08/27/21 0956 11/06/21 1617 11/08/21 2357 11/10/21 0240 11/21/21 0451 11/22/21 0439 11/23/21 0519  WBC 5.7 2.8* 2.8*   < > 4.7 4.8 4.6  NEUTROABS 3,865 2,041 2.2  --   --   --   --   HGB 8.3* 9.6* 9.1*   < > 7.5* 7.5* 7.6*  HCT 27.0* 29.3* 28.1*   < > 24.5* 24.7* 24.6*  MCV 84.6 81.6 83.6   < > 90.1 90.1 90.1  PLT 247 CANCELED 56*   < > 181 198 210   < > = values in this interval not displayed.   Lab  Results  Component Value Date   TSH 3.941 11/09/2021   No results found for: HGBA1C Lab Results  Component Value Date   CHOL 212 (H) 08/27/2021   HDL 67 08/27/2021   LDLCALC 128 (H) 08/27/2021   TRIG 78 08/27/2021   CHOLHDL 3.2 08/27/2021    Significant Diagnostic Results in last 30 days:  DG CHEST PORT 1 VIEW  Result Date: 11/13/2021 CLINICAL DATA:  Sepsis. EXAM: PORTABLE CHEST 1 VIEW COMPARISON:  November 09, 2021. FINDINGS: Stable cardiomegaly with mild central pulmonary vascular congestion. Small bilateral pleural effusions are noted. Possible bibasilar pulmonary edema is noted. The visualized skeletal structures are unremarkable. IMPRESSION: Stable cardiomegaly with mild central pulmonary vascular congestion and possible bibasilar pulmonary edema and small pleural effusions. Electronically Signed   By: Marijo Conception M.D.   On: 11/13/2021 12:57    Assessment/Plan 1. Encounter for completion of form with patient FMLA paper work completed to enable patient's daughter to assist with ADL's and with follow up medical visit.   2. Chronic combined systolic and diastolic CHF (congestive heart failure) (HCC) No signs of fluid overload reported. - continue on furosemide   3. CKD (chronic kidney disease) stage 4, GFR 15-29 ml/min (HCC) CR at baseline.Had Kidney injury during hospitalization.will need follow up for BMP to be rechecked in office.Daughter will schedule appointment when patient able.  Continue to follow up with Nephrologist   Family/ staff Communication: Reviewed plan of care with patient and daughter verbalized understanding   Labs/tests ordered: None   Next Appointment: As needed if symptoms worsen or fail to improve   I connected with  [name] on 12/16/21 by a video enabled telemedicine application and verified that I am speaking with the correct person using two identifiers.   I discussed the limitations of evaluation and management by telemedicine. The  patient expressed understanding and agreed to proceed.  Spent 11 minutes of non-face to face with patient  >50% time spent counseling; reviewing medical record; tests; labs; and developing future plan of care.   Aiyana Stegmann  Shelva Majestic, NP

## 2021-12-11 NOTE — Telephone Encounter (Signed)
Patient daughter, Kara Mullins dropped off FMLA Paperwork to be filled out.  Patient is being Discharged from SNF today with Positive Covid.  Scheduled a MyChart Visit to discuss Paperwork and Discharge.   [place] Claims Management  [employer] Case #:[id number] Team Member [phone]  Requested leave beginning on 11/28/2021 due to the need to care for Mother due to health condition.   Once paperwork is completed it is to be faxed to Fax:[phone]  Placed paperwork in [name]'s folder for MyChart Visit Dated 12/11/2021

## 2021-12-11 NOTE — Telephone Encounter (Signed)
Paperwork Completed and Daughter, Isobelle Tuckett is picking up.  Left up front for pick up and copy sent to scanning.

## 2021-12-14 ENCOUNTER — Telehealth: Payer: Medicare (Managed Care) | Admitting: Family

## 2021-12-14 ENCOUNTER — Telehealth (INDEPENDENT_AMBULATORY_CARE_PROVIDER_SITE_OTHER): Payer: Medicare (Managed Care) | Admitting: Family

## 2021-12-14 ENCOUNTER — Encounter: Payer: Self-pay | Admitting: Family

## 2021-12-14 ENCOUNTER — Other Ambulatory Visit: Payer: Self-pay

## 2021-12-14 DIAGNOSIS — I5042 Chronic combined systolic (congestive) and diastolic (congestive) heart failure: Secondary | ICD-10-CM

## 2021-12-14 DIAGNOSIS — R2681 Unsteadiness on feet: Secondary | ICD-10-CM | POA: Diagnosis not present

## 2021-12-14 NOTE — Progress Notes (Signed)
?This service is provided via telemedicine ? ?No vital signs collected/recorded due to the encounter was a telemedicine visit.  ? ?Location of patient (ex: home, work):  Home. ? ?Patient consents to a telephone visit:  Yes ? ?Location of the provider (ex: office, home):  [employer]. ? ?Name of any referring provider:  Rod Majerus, [name], NP  ? ?Names of all persons participating in the telemedicine service and their role in the encounter:  Patient, Daughter "Gayla Benn", [address], [place], [place], [place], NP.   ? ?Time spent on call: 8 minutes spent on the phone with Medical Assistant.   ? ? ?Provider: Marlowe Sax FNP-C ? ?Aileen Amore, [name], NP ? ?Patient Care Team: ?Jenan Ellegood, [name], NP as PCP - General (Family Medicine) ?Donato Heinz, MD as PCP - Cardiology (Cardiology) ? ?Extended Emergency Contact Information ?Primary Emergency Contact: [place],TERRA ?Mobile Phone: [phone] ?Relation: Daughter ? ?Code Status:  Full Code  ?Goals of care: Advanced Directive information ?Advanced Directives 12/14/2021  ?Does Patient Have a Medical Advance Directive? No  ?Would patient like information on creating a medical advance directive? No - Patient declined  ? ? ? ?Chief Complaint  ?Patient presents with  ? Acute Visit  ?  Patient daughter "Giavanna Kang" states that patient has increased swelling around thigh area.  ? ? ?HPI:  ?Pt is a 71 y.o. female seen today for an acute visit for evaluation of swelling on legs.Daughter states patient has had increased swelling on legs and around her thighs.Gave her furosemide for the past two days.Her weight today was 188 lbs previous weight prior to discharge from SNF was 175 lbs.she denies any cough or shortness of breath.Daughter has noticed cough when lying down.  ? ? ?Past Medical History:  ?Diagnosis Date  ? Chronic combined systolic and diastolic congestive heart failure ([place]) 11/09/2021  ? Generalized anxiety disorder   ? Hyperlipidemia  11/09/2021  ? Hypertension   ? Stage 3b chronic kidney disease (CKD) ([place]) 11/09/2021  ? ?Past Surgical History:  ?Procedure Laterality Date  ? CATARACT EXTRACTION, BILATERAL    ? combined systolic and diastolic congestive heart failure     ? ? ?Allergies  ?Allergen Reactions  ? Chlorhexidine   ? ? ?Outpatient Encounter Medications as of 12/14/2021  ?Medication Sig  ? carvedilol (COREG) 6.25 MG tablet Take 1 tablet (6.25 mg total) by mouth 2 (two) times daily with a meal.  ? furosemide (LASIX) 40 MG tablet Take 1 tablet (40 mg total) by mouth daily as needed for edema or fluid.  ? hydrALAZINE (APRESOLINE) 10 MG tablet Take 1 tablet (10 mg total) by mouth every 8 (eight) hours.  ? loratadine (CLARITIN) 10 MG tablet Take 1 tablet (10 mg total) by mouth daily as needed for itching.  ? Multiple Vitamin (MULTIVITAMIN WITH MINERALS) TABS tablet Take 1 tablet by mouth daily.  ? ondansetron (ZOFRAN) 4 MG tablet Take 1 tablet (4 mg total) by mouth every 6 (six) hours as needed for nausea or vomiting.  ? sodium bicarbonate 650 MG tablet Take 1 tablet (650 mg total) by mouth 3 (three) times daily.  ? ?No facility-administered encounter medications on file as of 12/14/2021.  ? ? ?Review of Systems  ?Constitutional:  Negative for appetite change, chills, fatigue and fever.  ?HENT:  Negative for congestion, rhinorrhea, sinus pressure, sinus pain, sneezing and sore throat.   ?Respiratory:  Negative for chest tightness, shortness of breath and wheezing.   ?     Cough with  lying down   ?Cardiovascular:  Positive for leg swelling. Negative for chest pain and palpitations.  ?Gastrointestinal:  Negative for abdominal distention, abdominal pain, nausea and vomiting.  ?Neurological:  Negative for dizziness, weakness, light-headedness and headaches.  ? ?Immunization History  ?Administered Date(s) Administered  ? PFIZER Comirnaty(Gray Top)Covid-19 Tri-Sucrose Vaccine 04/28/2021  ? PFIZER(Purple Top)SARS-COV-2 Vaccination 12/16/2019, 01/12/2020   ? Zoster Recombinat (Shingrix) 07/09/2021  ? ?Pertinent  Health Maintenance Due  ?Topic Date Due  ? COLONOSCOPY (Pts 45-59yrs Insurance coverage will need to be confirmed)  Never done  ? MAMMOGRAM  Never done  ? DEXA SCAN  Never done  ? INFLUENZA VACCINE  01/11/2022 (Originally 05/14/2021)  ? ?Fall Risk 11/22/2021 11/22/2021 11/23/2021 12/11/2021 12/14/2021  ?Falls in the past year? - - - 0 0  ?Was there an injury with Fall? - - - 0 0  ?Fall Risk Category Calculator - - - 0 0  ?Fall Risk Category - - - Low Low  ?Patient Fall Risk Level High fall risk High fall risk High fall risk Low fall risk Low fall risk  ?Patient at Risk for Falls Due to - - - No Fall Risks No Fall Risks  ?Fall risk Follow up - - - Falls evaluation completed Falls evaluation completed  ? ?Functional Status Survey: ?  ? ?There were no vitals filed for this visit. ?There is no height or weight on file to calculate BMI. ?Physical Exam ? ?Labs reviewed: ?Recent Labs  ?  11/15/21 ?0250 11/16/21 ?0246 11/20/21 ?0510 11/21/21 ?0451 11/22/21 ?7062 11/23/21 ?3762  ?NA 143   < > 143 142 143 142  ?K 3.5   < > 3.8 3.5 3.8 4.1  ?CL 121*   < > 119* 115* 119* 118*  ?CO2 17*   < > 18* 19* 18* 16*  ?GLUCOSE 120*   < > 137* 108* 99 75  ?BUN 65*   < > 63* 62* 61* 61*  ?CREATININE 4.49*   < > 4.51* 4.74* 4.35* 4.36*  ?CALCIUM 7.2*   < > 7.4* 7.3* 7.2* 6.9*  ?MG  --    < > 2.8* 2.8* 2.8* 2.9*  ?PHOS 6.4*  --  6.8*  --   --   --   ? < > = values in this interval not displayed.  ? ?Recent Labs  ?  11/13/21 ?0300 11/14/21 ?0233 11/15/21 ?0250 11/20/21 ?0510  ?AST 27 28  --  65*  ?ALT 15 14  --  27  ?ALKPHOS 111 107  --  144*  ?BILITOT 0.5 0.7  --  0.4  ?PROT 5.5* 4.7*  --  4.7*  ?ALBUMIN 1.9* 1.7* 1.8* 1.7*  ? ?Recent Labs  ?  08/27/21 ?0956 11/06/21 ?1617 11/08/21 ?2357 11/10/21 ?0240 11/21/21 ?0451 11/22/21 ?8315 11/23/21 ?1761  ?WBC 5.7 2.8* 2.8*   < > 4.7 4.8 4.6  ?NEUTROABS 3,865 2,041 2.2  --   --   --   --   ?HGB 8.3* 9.6* 9.1*   < > 7.5* 7.5* 7.6*  ?HCT 27.0* 29.3*  28.1*   < > 24.5* 24.7* 24.6*  ?MCV 84.6 81.6 83.6   < > 90.1 90.1 90.1  ?PLT 247 CANCELED 56*   < > 181 198 210  ? < > = values in this interval not displayed.  ? ?Lab Results  ?Component Value Date  ? TSH 3.941 11/09/2021  ? ?No results found for: HGBA1C ?Lab Results  ?Component Value Date  ? CHOL 212 (H) 08/27/2021  ? HDL  67 08/27/2021  ? [place] [employer] 128 (H) 08/27/2021  ? TRIG 78 08/27/2021  ? CHOLHDL 3.2 08/27/2021  ? ? ?Significant Diagnostic Results in last 30 days:  ?No results found. ? ?Assessment/Plan ?1. Chronic combined systolic and diastolic CHF (congestive heart failure) ([place]) ?Has had 13 lbs weight gain since she was discharged from SNF  ?Orthopnea reported with swelling on the legs  ?- Daughter advised to administered Furosemide 40 meq tablet daily x 3 days then resume PRN  ?- For home use only DME Other see comment: Bed side commode  ? ?2. Unsteady gait ?Request Bedside commode  ?- For home use only DME Other see comment: BSC script faxed to [facility] by [facility] [place],CMA  ? ?Family/ staff Communication: Reviewed plan of care with patient and daughter vrbalized understanding.  ? ?Labs/tests ordered: None  ? ?Next Appointment: As needed if symptoms worsen or fail to improve  ? ?I connected with  [name] on 12/14/21 by a Telephone enabled telemedicine application and verified that I am speaking with the correct person using two identifiers. ?  ?I discussed the limitations of evaluation and management by telemedicine. The patient expressed understanding and agreed to proceed. ? ?Spent 15  minutes of non-face to face with patient  >50% time spent counseling; reviewing medical record; labs; and developing future plan of care. ? ? ?[name], NP ?

## 2021-12-15 ENCOUNTER — Telehealth: Payer: Self-pay | Admitting: Internal Medicine

## 2021-12-15 DIAGNOSIS — I5042 Chronic combined systolic (congestive) and diastolic (congestive) heart failure: Secondary | ICD-10-CM | POA: Diagnosis not present

## 2021-12-15 DIAGNOSIS — R5381 Other malaise: Secondary | ICD-10-CM | POA: Diagnosis not present

## 2021-12-15 DIAGNOSIS — I130 Hypertensive heart and chronic kidney disease with heart failure and stage 1 through stage 4 chronic kidney disease, or unspecified chronic kidney disease: Secondary | ICD-10-CM | POA: Diagnosis not present

## 2021-12-15 DIAGNOSIS — R32 Unspecified urinary incontinence: Secondary | ICD-10-CM | POA: Diagnosis not present

## 2021-12-15 DIAGNOSIS — Z9181 History of falling: Secondary | ICD-10-CM | POA: Diagnosis not present

## 2021-12-15 DIAGNOSIS — R159 Full incontinence of feces: Secondary | ICD-10-CM | POA: Diagnosis not present

## 2021-12-15 DIAGNOSIS — N1832 Chronic kidney disease, stage 3b: Secondary | ICD-10-CM | POA: Diagnosis not present

## 2021-12-15 DIAGNOSIS — G309 Alzheimer's disease, unspecified: Secondary | ICD-10-CM | POA: Diagnosis not present

## 2021-12-15 DIAGNOSIS — E785 Hyperlipidemia, unspecified: Secondary | ICD-10-CM | POA: Diagnosis not present

## 2021-12-15 DIAGNOSIS — E43 Unspecified severe protein-calorie malnutrition: Secondary | ICD-10-CM | POA: Diagnosis not present

## 2021-12-15 NOTE — Telephone Encounter (Signed)
Patietn called through nursing staff at rehab center stating heart rate had been persistently in the mid 40s.  Asymptomatic but concerned.  Reviewing recent hospitalization she has had hrs that have ranged in mid 50s to low 60s.  She was on higher dose of during admission which was cut in half.  There was concern and work up for amyloidosis of note. ? ?Ideally given amyloid and ckd would transition to amlodipine from coreg for bp control however given  bp elevated will cut coreg to 3.125 mg twice daily and continue to monitor hr.  ? ?They are to call the office Monday am. ? ? ? ?Kara Mullins A Jem Castro ? ?  ?

## 2021-12-17 ENCOUNTER — Telehealth: Payer: Self-pay | Admitting: Cardiology

## 2021-12-17 DIAGNOSIS — N130 Hydronephrosis with ureteropelvic junction obstruction: Secondary | ICD-10-CM | POA: Diagnosis not present

## 2021-12-17 NOTE — Telephone Encounter (Signed)
Left message to call back  

## 2021-12-17 NOTE — Telephone Encounter (Signed)
STAT if HR is under 50 or over 120 ?(normal HR is 60-100 beats per minute) ? ?What is your heart rate? 60 now ? ?Do you have a log of your heart rate readings (document readings)? 47 over the weekend ? ?Do you have any other symptoms? no ? ? ?Patient's daughter [name] states the patient's home care nurse called this weekend to inform her HR was low. She says it was 42 and they told her to cut the carvedilol in half. She says last night the patient's HR was a little low so she did not give her the carvedilol at first, but then her BP went up to 150/90 so she did give it to her.  ? ?

## 2021-12-17 NOTE — Telephone Encounter (Signed)
Spoke to daughter and patient-daughter reports concerns with low HR.   States home health nurse comes to help patient and noticed her HR in the 40s.   She instructed her to decrease her carvedilol to 3.25 mg.  HR was 41 PM on the 4th, was instructed to hold coreg.   Daughter put her apple watch on patient and HR has been ranging 40-60s.   ? ?HR readings:  ?3/2 58 ?3/3 58 ?3/4 47 AM 41 PM ?3/5 48 AM 50 PM ? ?Patient denies dizziness, lightheadedness.   Daughter states patient woke up last night stating she was hot, she checked her BP and it was 155/90 HR 50-53, she gave her her morning medication (3.25 coreg) and this resolved (unsure readings).   She reports BP usually 165E-006J systolic after medication.   She has not checked this morning as they had to go to the doctor early for lab work.   Patient feels okay and is asymptomatic currently.   ? ?Patient currently taking: ?Carvedilol 6.25 mg BID ?Hydralazine 10 mg TID ?Lasix 40 mg PRN ? ?Patient saw Dr. Gardiner Rhyme 04/2021-due for OV in October.  Patient had recent hospitalization and medication changes made.   Advised would send message to Dr. Gardiner Rhyme to review but follow up appointment made as well on 3/8.   ? ?Daughter aware and verbalized understanding.  ?

## 2021-12-18 ENCOUNTER — Telehealth: Payer: Self-pay | Admitting: *Deleted

## 2021-12-18 NOTE — Telephone Encounter (Signed)
Received fax from [facility] [phone] stating They received the order for patient's Bedside Commode. They needed more information of patient's Height and Weight and OV notes to support.  ? ?Faxed requested information to them at Fax: [phone] ?

## 2021-12-18 NOTE — Telephone Encounter (Signed)
Is she back on the coreg, or did she stop it? ?

## 2021-12-19 ENCOUNTER — Other Ambulatory Visit: Payer: Self-pay

## 2021-12-19 ENCOUNTER — Ambulatory Visit (INDEPENDENT_AMBULATORY_CARE_PROVIDER_SITE_OTHER): Payer: Medicare (Managed Care) | Admitting: Cardiology

## 2021-12-19 ENCOUNTER — Ambulatory Visit: Payer: Medicare (Managed Care)

## 2021-12-19 ENCOUNTER — Ambulatory Visit: Payer: Medicare (Managed Care) | Admitting: Family

## 2021-12-19 ENCOUNTER — Encounter: Payer: Self-pay | Admitting: Cardiology

## 2021-12-19 ENCOUNTER — Telehealth: Payer: Self-pay

## 2021-12-19 VITALS — BP 160/82 | HR 54 | Ht 65.0 in | Wt 188.0 lb

## 2021-12-19 DIAGNOSIS — I10 Essential (primary) hypertension: Secondary | ICD-10-CM

## 2021-12-19 DIAGNOSIS — Z79899 Other long term (current) drug therapy: Secondary | ICD-10-CM

## 2021-12-19 DIAGNOSIS — I5042 Chronic combined systolic (congestive) and diastolic (congestive) heart failure: Secondary | ICD-10-CM

## 2021-12-19 DIAGNOSIS — N179 Acute kidney failure, unspecified: Secondary | ICD-10-CM | POA: Diagnosis not present

## 2021-12-19 DIAGNOSIS — R001 Bradycardia, unspecified: Secondary | ICD-10-CM

## 2021-12-19 MED ORDER — CARVEDILOL 3.125 MG PO TABS: 3.1250 mg | ORAL_TABLET | Freq: Two times a day (BID) | ORAL | 3 refills | Status: DC

## 2021-12-19 NOTE — Telephone Encounter (Signed)
OV today with Dr. Gardiner Rhyme ?

## 2021-12-19 NOTE — Telephone Encounter (Signed)
Incoming voicemail received stating patients daughter has FMLA paperwork that needs to be corrected/adjusted.  ? ?Called returned to patients daughter, Per [name] paperwork  has several areas that need to be adjusted. ? ?Video visit scheduled for tomorrow to discuss in depth. Paperwork was received via faxed and placed in [name]'s review and sign folder for tomorrow's appointment  ? ? ?

## 2021-12-19 NOTE — Progress Notes (Signed)
Cardiology Office Note:    Date:  12/20/2021   ID:  Kara Mullins, DOB 1951-06-14, MRN [id number]  PCP:  [name], NP  Cardiologist:  Donato Heinz, MD  Electrophysiologist:  None   Referring MD: [name], NP   No chief complaint on file.    History of Present Illness:    Kara Mullins is a 71 y.o. female with a hx of chronic combined systolic metastatic heart failure, hypertension, lymphedema who presents for hospital follow-up.  She was admitted to [facility] on 03/05/2021 with shortness of breath.  Echocardiogram showed LVEF 45%, severe LVH, concerning for cardiac amyloid.  Work-up for amyloidosis showed abnormal light chains, hematology was consulted underwent bone marrow biopsy.  Course is complicated by AKI thought to be secondary to contrast nephropathy.  Nephrology was consulted and she was diuresed with IV Lasix with improvement in her kidney function.  She was discharged on oral Lasix 40 mg daily.  Cardiac MRI on 04/30/2021 showed no evidence of cardiac amyloidosis, moderate asymmetric LVH not meeting HCM criteria, LVEF 53%, RVEF 57%, no LGE.  She was admitted in February 2023 with AKI.  Baseline creatinine 1.3-1.6, was up to 4.7.  Creatinine 4.3 on discharge.  She was evaluated by nephrology, stated not a good candidate for hemodialysis or aggressive intervention.  Felt that AKI was likely secondary to ATN but could not rule out plasma cell dyscrasia.  She was also found to have UTI and completed 7-day course of meropenem.  Her Lasix, losartan, and spironolactone were discontinued due to AKI.  She was discharged on as needed Lasix.  Palliative care was consulted and recommended outpatient follow-up.  Since last clinic visit, she reports has been doing okay.  Denies any chest pain, lightheadedness, syncope, or palpitations.  Does report persistent lower extremity edema.  Reports had shortness of breath yesterday when took hydralazine.  She has been holding  hydralazine as reports feels short of breath after taking it.  Has cut carvedilol dose to 3.125 mg twice daily.  Reports BP 130s-140s over 80s at home.  Has been taking Lasix 40 mg daily.  Reports had COVID-19 infection 3 weeks ago.   Wt Readings from Last 3 Encounters:  12/19/21 188 lb (85.3 kg)  11/13/21 178 lb 9.2 oz (81 kg)  11/06/21 167 lb (75.8 kg)      Past Medical History:  Diagnosis Date   Chronic combined systolic and diastolic congestive heart failure ([place]) 11/09/2021   Generalized anxiety disorder    Hyperlipidemia 11/09/2021   Hypertension    Stage 3b chronic kidney disease (CKD) ([place]) 11/09/2021    Past Surgical History:  Procedure Laterality Date   CATARACT EXTRACTION, BILATERAL     combined systolic and diastolic congestive heart failure       Current Medications: Current Meds  Medication Sig   furosemide (LASIX) 40 MG tablet Take 1 tablet (40 mg total) by mouth daily as needed for edema or fluid.   Multiple Vitamin (MULTIVITAMIN WITH MINERALS) TABS tablet Take 1 tablet by mouth daily.   sodium bicarbonate 650 MG tablet Take 1 tablet (650 mg total) by mouth 3 (three) times daily.   [DISCONTINUED] carvedilol (COREG) 6.25 MG tablet Take 1 tablet (6.25 mg total) by mouth 2 (two) times daily with a meal.   [DISCONTINUED] hydrALAZINE (APRESOLINE) 10 MG tablet Take 1 tablet (10 mg total) by mouth every 8 (eight) hours.   [DISCONTINUED] loratadine (CLARITIN) 10 MG tablet Take 1 tablet (10 mg total)  by mouth daily as needed for itching.   [DISCONTINUED] ondansetron (ZOFRAN) 4 MG tablet Take 1 tablet (4 mg total) by mouth every 6 (six) hours as needed for nausea or vomiting.     Allergies:   Chlorhexidine   Social History   Socioeconomic History   Marital status: Divorced    Spouse name: Not on file   Number of children: Not on file   Years of education: Not on file   Highest education level: Not on file  Occupational History   Not on file  Tobacco Use    Smoking status: Never   Smokeless tobacco: Never  Vaping Use   Vaping Use: Never used  Substance and Sexual Activity   Alcohol use: Not Currently   Drug use: Never   Sexual activity: Not on file  Other Topics Concern   Not on file  Social History Narrative   Tobacco use, amount per day now: N/A   Past tobacco use, amount per day: N/A   How many years did you use tobacco: N/A   Alcohol use (drinks per week): N/A   Diet:   Do you drink/eat things with caffeine: Coffee   Marital status: Divorced                                 What year were you married? 1992   Do you live in a house, apartment, assisted living, condo, trailer, etc.?   Is it one or more stories?   How many persons live in your home? 2   Do you have pets in your home?( please list) No   Highest Level of education completed? High School   Current or past profession: Restaurant Employee   Do you exercise?   Yes                               Type and how often? Walk   Do you have a living will? No   Do you have a DNR form?    No                               If not, do you want to discuss one?   Do you have signed POA/HPOA forms? No                       If so, please bring to you appointment      Do you have any difficulty bathing or dressing yourself? No   Do you have any difficulty preparing food or eating? No   Do you have any difficulty managing your medications? No   Do you have any difficulty managing your finances? No   Do you have any difficulty affording your medications? No   Social Determinants of Radio broadcast assistant Strain: Not on file  Food Insecurity: Not on file  Transportation Needs: Not on file  Physical Activity: Not on file  Stress: Not on file  Social Connections: Not on file     Family History: The patient's family history includes Throat cancer in her father.  ROS:   Please see the history of present illness.    (+) LE edema (+) shortness of breath  All other systems  reviewed and are negative.  EKGs/Labs/Other Studies Reviewed:  The following studies were reviewed today: Cardiac morphology 07/22:  IMPRESSION:  1.  No evidence of cardiac amyloidosis   2. Asymmetric LV hypertrophy measuring up to 24m in basal septum (73min posterior wall), not meeting criteria for hypertrophic cardiomyopathy (<1531m  3.  Mild LV dilatation with low normal systolic function (EF 53%09% 4.  Normal RV size and systolic function (EF 57%38% 5.  No late gadolinium enhancement to suggest myocardial scar  Echo 03/05/2021: 1. Left ventricular ejection fraction, by estimation, is 45%. The left  ventricle has mildly decreased function. The left ventricle demonstrates  global hypokinesis. There is severe left ventricular hypertrophy. Left  ventricular diastolic parameters are  consistent with Grade I diastolic dysfunction (impaired relaxation). Would  consider cardiac amyloidosis.   2. Right ventricular systolic function is normal. The right ventricular  size is normal. There is mildly elevated pulmonary artery systolic  pressure. The estimated right ventricular systolic pressure is 41.18.2Hg.   3. Left atrial size was severely dilated.   4. The mitral valve is normal in structure. Mild to moderate mitral valve  regurgitation. No evidence of mitral stenosis.   5. The aortic valve is tricuspid. Aortic valve regurgitation is trivial.  No aortic stenosis is present.   6. Aortic dilatation noted. There is mild dilatation of the aortic root,  measuring 37 mm.   7. The inferior vena cava is dilated in size with <50% respiratory  variability, suggesting right atrial pressure of 15 mmHg.   8. There is a left pleural effusion and trivial pericardial effusion.   US US Venous Bilateral 03/05/2021: BILATERAL:  - No evidence of deep vein thrombosis seen in the lower extremities,  bilaterally.  - No evidence of superficial venous thrombosis in the lower extremities,   bilaterally.  -No evidence of popliteal cyst, bilaterally.  -Significant subcutaneous edema found throughout lower extremities.  CT Angio Chest 03/05/2021: IMPRESSION: 1. No pulmonary embolus. 2. Pulmonary edema with trace to small volume left and small to moderate volume right pleural effusions. 3.  Aortic Atherosclerosis (ICD10-I70.0).  EKG:   12/19/21: Sinus bradycardia, rate 54, left axis deviation, poor R wave progression 07/22:no EKG was ordered today. 03/19/2021: NSR, rate 57, nonspecific T wave flattening  Recent Labs: 11/09/2021: TSH 3.941 12/19/2021: ALT 17; BNP 510.8; BUN 57; Creatinine, Ser 4.35; Hemoglobin 8.1; Magnesium 3.0; Platelets 64; Potassium 4.1; Sodium 147  Recent Lipid Panel    Component Value Date/Time   CHOL 212 (H) 08/27/2021 0956   TRIG 78 08/27/2021 0956   HDL 67 08/27/2021 0956   CHOLHDL 3.2 08/27/2021 0956   LDLCALC 128 (H) 08/27/2021 0956    Physical Exam:    VS:  BP (!) 160/82    Pulse (!) 54    Ht 5' 5"  (1.651 m)    Wt 188 lb (85.3 kg)    SpO2 100%    BMI 31.28 kg/m     Wt Readings from Last 3 Encounters:  12/19/21 188 lb (85.3 kg)  11/13/21 178 lb 9.2 oz (81 kg)  11/06/21 167 lb (75.8 kg)     GEN: Well nourished, well developed in no acute distress HEENT: Normal NECK: No JVD; No carotid bruits LYMPHATICS: No lymphadenopathy CARDIAC: RRR, no murmurs, rubs, gallops RESPIRATORY:  Clear to auscultation without rales, wheezing or rhonchi  ABDOMEN: Soft, non-tender, non-distended MUSCULOSKELETAL: Trace pitting edema; has lymphedema SKIN: Warm and dry NEUROLOGIC:  Alert and oriented x 3 PSYCHIATRIC:  Normal affect   ASSESSMENT:  1. Chronic combined systolic and diastolic heart failure ([place])   2. Essential hypertension   3. Medication management   4. AKI (acute kidney injury) ([place])   5. Bradycardia     PLAN:    Chronic systolic and diastolic heart failure: Diagnosed during recent admission, with EF 45% and severe LVH on echo but  relatively low voltage on EKG, concerning for cardiac amyloidosis.  Amyloid work-up showed abnormal light chains and underwent bone marrow biopsy with hematology.  Cardiac MRI on 04/30/2021 showed no evidence of cardiac amyloidosis, moderate asymmetric LVH not meeting HCM criteria, LVEF 53%, RVEF 57%, no LGE. -Continue carvedilol, dose has been cut to 3.25 mg twice daily due to bradycardia.  Will check Zio patch x3 days -Losartan and spironolactone were discontinued due to AKI. -Continue Lasix 40 mg daily.  We will check CMP, magnesium, BNP  AKI on CKD: Baseline creatinine 1.3-1.6, was up to 4.7.  Creatinine 4.3 on discharge.  She was evaluated by nephrology, stated not a good candidate for hemodialysis or aggressive intervention.  Felt that AKI was likely secondary to ATN but could not rule out plasma cell dyscrasia.  She was also found to have UTI and completed 7-day course of meropenem.  Her Lasix, losartan, and spironolactone were discontinued due to AKI.  She was discharged on as needed Lasix.  Palliative care was consulted and recommended outpatient follow-up  Hypertension: On carvedilol 3.125 mg twice daily.  She was also discharged on hydralazine but reports is not tolerating and discontinued.  Reports BPs have been reasonably controlled on carvedilol.  Forgot home BP log, will call with log when she gets home  Hyperlipidemia: LDL 125 on 04/30/2021.  Previously started rosuvastatin but did not tolerate, was discontinued  RTC in 3 months   Medication Adjustments/Labs and Tests Ordered: Current medicines are reviewed at length with the patient today.  Concerns regarding medicines are outlined above.  Orders Placed This Encounter  Procedures   Comprehensive metabolic panel   CBC   Brain natriuretic peptide   Magnesium   Ambulatory referral to Nephrology   LONG TERM MONITOR (3-14 DAYS)   EKG 12-Lead   Meds ordered this encounter  Medications   carvedilol (COREG) 3.125 MG tablet    Sig:  Take 1 tablet (3.125 mg total) by mouth 2 (two) times daily with a meal.    Dispense:  180 tablet    Refill:  3    Patient Instructions  Medication Instructions:  STOP hydralazine Continue carvedilol (Coreg) 3.125 mg two times daily  --please call when you get home to report BP and HR readings (ok to send via Mychart as well)  *If you need a refill on your cardiac medications before your next appointment, please call your pharmacy*   Lab Work: CMET, CBC, Mag, BNP today  If you have labs (blood work) drawn today and your tests are completely normal, you will receive your results only by: MyChart Message (if you have MyChart) OR A paper copy in the mail If you have any lab test that is abnormal or we need to change your treatment, we will call you to review the results.   Testing/Procedures: [place] [place]- Long Term Monitor Instructions   Your physician has requested you wear a ZIO patch monitor for _3_ days.  This is a single patch monitor.   [employer] supplies one patch monitor per enrollment. Additional stickers are not available. Please do not apply patch if you will be having a Nuclear Stress Test, Echocardiogram,  Cardiac CT, MRI, or Chest Xray during the period you would be wearing the monitor. The patch cannot be worn during these tests. You cannot remove and re-apply the ZIO XT patch monitor.  Your ZIO patch monitor will be sent Fed Ex from [employer] directly to your home address. It may take 3-5 days to receive your monitor after you have been enrolled.  Once you have received your monitor, please review the enclosed instructions. Your monitor has already been registered assigning a specific monitor serial # to you.  Billing and Patient Assistance Program Information   We have supplied [employer] with any of your insurance information on file for billing purposes. [employer] offers a sliding scale Patient Assistance Program for patients that do not have insurance, or whose  insurance does not completely cover the cost of the ZIO monitor.   You must apply for the Patient Assistance Program to qualify for this discounted rate.     To apply, please call [employer] at [phone], select option 4, then select option 2, and ask to apply for Patient Assistance Program.  Theodore Demark will ask your household income, and how many people are in your household.  They will quote your out-of-pocket cost based on that information.  [employer] will also be able to set up a 81-month interest-free payment plan if needed.  Applying the monitor   Shave hair from upper left chest.  Hold abrader disc by orange tab. Rub abrader in 40 strokes over the upper left chest as indicated in your monitor instructions.  Clean area with 4 enclosed alcohol pads. Let dry.  Apply patch as indicated in monitor instructions. Patch will be placed under collarbone on left side of chest with arrow pointing upward.  Rub patch adhesive wings for 2 minutes. Remove white label marked "1". Remove the white label marked "2". Rub patch adhesive wings for 2 additional minutes.  While looking in a mirror, press and release button in center of patch. A small green light will flash 3-4 times. This will be your only indicator that the monitor has been turned on. ?  Do not shower for the first 24 hours. You may shower after the first 24 hours.  Press the button if you feel a symptom. You will hear a small click. Record Date, Time and Symptom in the Patient Logbook.  When you are ready to remove the patch, follow instructions on the last 2 pages of the Patient Logbook. Stick patch monitor onto the last page of Patient Logbook.  Place Patient Logbook in the blue and white box.  Use locking tab on box and tape box closed securely.  The blue and white box has prepaid postage on it. Please place it in the mailbox as soon as possible. Your physician should have your test results approximately 7 days after the monitor has been mailed back to  [facility]  Call [facility] [phone] you have questions regarding your ZIO XT patch monitor. Call them immediately if you see an orange light blinking on your monitor.  If your monitor falls off in less than 4 days, contact our Monitor department at [phone] ?If your monitor becomes loose or falls off after 4 days call [employer] at [phone] suggestions on securing your monitor.?  Follow-Up: At [facility] you and your health needs are our priority.  As part of our continuing mission to provide you with exceptional heart care, we have created designated Provider Care Teams.  These Care Teams include  your primary Cardiologist (physician) and Advanced Practice Providers (APPs -  Physician Assistants and Nurse Practitioners) who all work together to provide you with the care you need, when you need it.  We recommend signing up for the patient portal called "MyChart".  Sign up information is provided on this After Visit Summary.  MyChart is used to connect with patients for Virtual Visits (Telemedicine).  Patients are able to view lab/test results, encounter notes, upcoming appointments, etc.  Non-urgent messages can be sent to your provider as well.   To learn more about what you can do with MyChart, go to [email or web address].    Your next appointment:   3 month(s)  The format for your next appointment:   In Person  Provider:   Donato Heinz, MD           Signed, Donato Heinz, MD  12/20/2021 9:28 PM    [place]

## 2021-12-19 NOTE — Patient Instructions (Signed)
Medication Instructions:  ?STOP hydralazine ?Continue carvedilol (Coreg) 3.125 mg two times daily ? ?--please call when you get home to report BP and HR readings (ok to send via Mychart as well) ? ?*If you need a refill on your cardiac medications before your next appointment, please call your pharmacy* ? ? ?Lab Work: ?CMET, CBC, Mag, BNP today ? ?If you have labs (blood work) drawn today and your tests are completely normal, you will receive your results only by: ?MyChart Message (if you have MyChart) OR ?A paper copy in the mail ?If you have any lab test that is abnormal or we need to change your treatment, we will call you to review the results. ? ? ?Testing/Procedures: ?ZIO XT- Long Term Monitor Instructions  ? ?Your physician has requested you wear a ZIO patch monitor for _3_ days.  ?This is a single patch monitor.   [employer] supplies one patch monitor per enrollment. Additional stickers are not available. Please do not apply patch if you will be having a Nuclear Stress Test, Echocardiogram, Cardiac CT, MRI, or Chest Xray during the period you would be wearing the monitor. The patch cannot be worn during these tests. You cannot remove and re-apply the ZIO XT patch monitor.  ?Your ZIO patch monitor will be sent Fed Ex from [employer] directly to your home address. It may take 3-5 days to receive your monitor after you have been enrolled.  ?Once you have received your monitor, please review the enclosed instructions. Your monitor has already been registered assigning a specific monitor serial # to you. ? ?Billing and Patient Assistance Program Information  ? ?We have supplied [employer] with any of your insurance information on file for billing purposes. ?[employer] offers a sliding scale Patient Assistance Program for patients that do not have insurance, or whose insurance does not completely cover the cost of the ZIO monitor.   You must apply for the Patient Assistance Program to qualify for this discounted  rate.     To apply, please call [employer] at [phone], select option 4, then select option 2, and ask to apply for Patient Assistance Program.  Theodore Demark will ask your household income, and how many people are in your household.  They will quote your out-of-pocket cost based on that information.  [employer] will also be able to set up a 69-month, interest-free payment plan if needed. ? ?Applying the monitor  ? ?Shave hair from upper left chest.  ?Hold abrader disc by orange tab. Rub abrader in 40 strokes over the upper left chest as indicated in your monitor instructions.  ?Clean area with 4 enclosed alcohol pads. Let dry.  ?Apply patch as indicated in monitor instructions. Patch will be placed under collarbone on left side of chest with arrow pointing upward.  ?Rub patch adhesive wings for 2 minutes. Remove white label marked "1". Remove the white label marked "2". Rub patch adhesive wings for 2 additional minutes.  ?While looking in a mirror, press and release button in center of patch. A small green light will flash 3-4 times. This will be your only indicator that the monitor has been turned on. ?  ?Do not shower for the first 24 hours. You may shower after the first 24 hours.  ?Press the button if you feel a symptom. You will hear a small click. Record Date, Time and Symptom in the Patient Logbook.  ?When you are ready to remove the patch, follow instructions on the last 2 pages of the Patient Logbook. Stick  patch monitor onto the last page of Patient Logbook.  ?Place Patient Logbook in the blue and white box.  Use locking tab on box and tape box closed securely.  The blue and white box has prepaid postage on it. Please place it in the mailbox as soon as possible. Your physician should have your test results approximately 7 days after the monitor has been mailed back to [facility].  ?Call [facility] at [phone] if you have questions regarding your ZIO XT patch monitor. Call them  immediately if you see an orange light blinking on your monitor.  ?If your monitor falls off in less than 4 days, contact our Monitor department at [phone]. ?If your monitor becomes loose or falls off after 4 days call [employer] at [phone] for suggestions on securing your monitor.? ? ?Follow-Up: ?At [facility], you and your health needs are our priority.  As part of our continuing mission to provide you with exceptional heart care, we have created designated Provider Care Teams.  These Care Teams include your primary Cardiologist (physician) and Advanced Practice Providers (APPs -  Physician Assistants and Nurse Practitioners) who all work together to provide you with the care you need, when you need it. ? ?We recommend signing up for the patient portal called "MyChart".  Sign up information is provided on this After Visit Summary.  MyChart is used to connect with patients for Virtual Visits (Telemedicine).  Patients are able to view lab/test results, encounter notes, upcoming appointments, etc.  Non-urgent messages can be sent to your provider as well.   ?To learn more about what you can do with MyChart, go to [email or web address].   ? ?Your next appointment:   ?3 month(s) ? ?The format for your next appointment:   ?In Person ? ?Provider:   ?Donato Heinz, MD   ? ? ? ?

## 2021-12-19 NOTE — Telephone Encounter (Signed)
noted 

## 2021-12-19 NOTE — Progress Notes (Unsigned)
Enrolled for [employer] to mail a ZIO XT long term holter monitor to the patients address on file.  

## 2021-12-20 ENCOUNTER — Telehealth (INDEPENDENT_AMBULATORY_CARE_PROVIDER_SITE_OTHER): Payer: Medicare (Managed Care) | Admitting: Family

## 2021-12-20 ENCOUNTER — Telehealth: Payer: Self-pay | Admitting: Cardiology

## 2021-12-20 ENCOUNTER — Encounter: Payer: Self-pay | Admitting: Family

## 2021-12-20 DIAGNOSIS — Z0289 Encounter for other administrative examinations: Secondary | ICD-10-CM

## 2021-12-20 DIAGNOSIS — N179 Acute kidney failure, unspecified: Secondary | ICD-10-CM

## 2021-12-20 DIAGNOSIS — I5042 Chronic combined systolic (congestive) and diastolic (congestive) heart failure: Secondary | ICD-10-CM

## 2021-12-20 DIAGNOSIS — I10 Essential (primary) hypertension: Secondary | ICD-10-CM

## 2021-12-20 DIAGNOSIS — Z79899 Other long term (current) drug therapy: Secondary | ICD-10-CM

## 2021-12-20 LAB — COMPREHENSIVE METABOLIC PANEL
ALT: 17 IU/L (ref 0–32)
AST: 33 IU/L (ref 0–40)
Albumin/Globulin Ratio: 0.8 — ABNORMAL LOW (ref 1.2–2.2)
Albumin: 2.2 g/dL — ABNORMAL LOW (ref 3.8–4.8)
Alkaline Phosphatase: 168 IU/L — ABNORMAL HIGH (ref 44–121)
BUN/Creatinine Ratio: 13 (ref 12–28)
BUN: 57 mg/dL — ABNORMAL HIGH (ref 8–27)
Bilirubin Total: 0.3 mg/dL (ref 0.0–1.2)
CO2: 17 mmol/L — ABNORMAL LOW (ref 20–29)
Calcium: 7 mg/dL — ABNORMAL LOW (ref 8.7–10.3)
Chloride: 117 mmol/L (ref 96–106)
Creatinine, Ser: 4.35 mg/dL — ABNORMAL HIGH (ref 0.57–1.00)
Globulin, Total: 2.9 g/dL (ref 1.5–4.5)
Glucose: 55 mg/dL — ABNORMAL LOW (ref 70–99)
Potassium: 4.1 mmol/L (ref 3.5–5.2)
Sodium: 147 mmol/L — ABNORMAL HIGH (ref 134–144)
Total Protein: 5.1 g/dL — ABNORMAL LOW (ref 6.0–8.5)
eGFR: 10 mL/min/{1.73_m2} — ABNORMAL LOW (ref >59)

## 2021-12-20 LAB — CBC
Hematocrit: 24.3 % — ABNORMAL LOW (ref 34.0–46.6)
Hemoglobin: 8.1 g/dL — ABNORMAL LOW (ref 11.1–15.9)
MCH: 26.9 pg (ref 26.6–33.0)
MCHC: 33.3 g/dL (ref 31.5–35.7)
MCV: 81 fL (ref 79–97)
NRBC: 1 % — ABNORMAL HIGH (ref 0–0)
Platelets: 64 x10E3/uL — CL (ref 150–450)
RBC: 3.01 x10E6/uL — ABNORMAL LOW (ref 3.77–5.28)
RDW: 18.1 % — ABNORMAL HIGH (ref 11.7–15.4)
WBC: 4 x10E3/uL (ref 3.4–10.8)

## 2021-12-20 LAB — BRAIN NATRIURETIC PEPTIDE: BNP: 510.8 pg/mL — ABNORMAL HIGH (ref 0.0–100.0)

## 2021-12-20 LAB — MAGNESIUM: Magnesium: 3 mg/dL — ABNORMAL HIGH (ref 1.6–2.3)

## 2021-12-20 NOTE — Telephone Encounter (Signed)
BP have been running low this morning,  ?86/61 ?90/74 ?75/62 ?104/68 at 12:30pm [place] took it.  ? ?Caregiver held her carvedilol this morning. Please call on what you would like her to do.  HR is normal range 65-75, she is fatigued this today.  She did stop her hydralazine as directed yesterday.  ?

## 2021-12-20 NOTE — Telephone Encounter (Signed)
Hold coreg for now.  If BP remains low (SBP<90) would go to ED ?

## 2021-12-20 NOTE — Progress Notes (Signed)
This service is provided via telemedicine  No vital signs collected/recorded due to the encounter was a telemedicine visit.   Location of patient (ex: home, work):  Home.  Patient consents to a telephone visit:  Yes  Location of the provider (ex: office, home):  [employer].  Name of any referring provider:  Everett Ricciardelli, [name], NP   Names of all persons participating in the telemedicine service and their role in the encounter:  Patient, Daughter "Cherlynn Polo Dillard, RMA, Sakina Briones, Webb Silversmith, NP.    Time spent on call: 8 minutes spent on the phone with Medical Assistant.      Provider: Marlowe Sax FNP-C  Cordai Rodrigue, [name], NP  Patient Care Team: Ariell Gunnels, [name], NP as PCP - General (Family Medicine) Donato Heinz, MD as PCP - Cardiology (Cardiology)  Extended Emergency Contact Information Primary Emergency Contact: [facility] Phone: [phone] Relation: Daughter  Code Status:  Full Code  Goals of care: Advanced Directive information Advanced Directives 12/20/2021  Does Patient Have a Medical Advance Directive? No  Would patient like information on creating a medical advance directive? No - Patient declined     Chief Complaint  Patient presents with   Form Completion    Patient daughter "Mazi Schuff" states she needs FMLA paper work. Form has to have patient name and daughter name on it.     HPI:  Pt is a 71 y.o. female seen today for an acute visit for completion of FMLA paper work.Employer requested additional information on recently completed FMLA to indicate beginning and ending dates as needed for the period of incapacity for continuous claims.   She states blood pressure today was 105/75 later rechecked was 86/61 HR runs in 53 - 75 b/min. Daughter states held blood pressure medication today but gave Furosemide 40 mg tablet. She has called cardiologist office awaiting for call back.     Past Medical History:   Diagnosis Date   Chronic combined systolic and diastolic congestive heart failure ([place]) 11/09/2021   Generalized anxiety disorder    Hyperlipidemia 11/09/2021   Hypertension    Stage 3b chronic kidney disease (CKD) ([place]) 11/09/2021   Past Surgical History:  Procedure Laterality Date   CATARACT EXTRACTION, BILATERAL     combined systolic and diastolic congestive heart failure       Allergies  Allergen Reactions   Chlorhexidine     Outpatient Encounter Medications as of 12/20/2021  Medication Sig   carvedilol (COREG) 3.125 MG tablet Take 1 tablet (3.125 mg total) by mouth 2 (two) times daily with a meal.   furosemide (LASIX) 40 MG tablet Take 1 tablet (40 mg total) by mouth daily as needed for edema or fluid.   Multiple Vitamin (MULTIVITAMIN WITH MINERALS) TABS tablet Take 1 tablet by mouth daily.   sodium bicarbonate 650 MG tablet Take 1 tablet (650 mg total) by mouth 3 (three) times daily.   No facility-administered encounter medications on file as of 12/20/2021.    Review of Systems  Constitutional:  Positive for chills. Negative for appetite change, fatigue and fever.  Respiratory:  Negative for cough, chest tightness, shortness of breath and wheezing.   Cardiovascular:  Positive for leg swelling. Negative for chest pain and palpitations.  Gastrointestinal:  Negative for abdominal distention, abdominal pain, constipation, nausea and vomiting.  Musculoskeletal:  Positive for gait problem. Negative for joint swelling and myalgias.  Skin:  Negative for color change and pallor.  Neurological:  Negative for dizziness, speech difficulty, light-headedness  and headaches.  Psychiatric/Behavioral:  Negative for agitation, confusion and sleep disturbance.    Immunization History  Administered Date(s) Administered   PFIZER Comirnaty(Gray Top)Covid-19 Tri-Sucrose Vaccine 04/28/2021   PFIZER(Purple Top)SARS-COV-2 Vaccination 12/16/2019, 01/12/2020   Zoster Recombinat (Shingrix) 07/09/2021    Pertinent  Health Maintenance Due  Topic Date Due   COLONOSCOPY (Pts 45-45yrs Insurance coverage will need to be confirmed)  Never done   MAMMOGRAM  Never done   DEXA SCAN  Never done   INFLUENZA VACCINE  01/11/2022 (Originally 05/14/2021)   Fall Risk 11/22/2021 11/23/2021 12/11/2021 12/14/2021 12/20/2021  Falls in the past year? - - 0 0 0  Was there an injury with Fall? - - 0 0 0  Fall Risk Category Calculator - - 0 0 0  Fall Risk Category - - Low Low Low  Patient Fall Risk Level High fall risk High fall risk Low fall risk Low fall risk Low fall risk  Patient at Risk for Falls Due to - - No Fall Risks No Fall Risks No Fall Risks  Fall risk Follow up - - Falls evaluation completed Falls evaluation completed Falls evaluation completed   Functional Status Survey:    There were no vitals filed for this visit. There is no height or weight on file to calculate BMI. Physical Exam Constitutional:      General: She is not in acute distress.    Appearance: She is not ill-appearing.  Pulmonary:     Effort: Pulmonary effort is normal. No respiratory distress.  Musculoskeletal:     Right lower leg: Edema present.     Left lower leg: Edema present.     Comments: Unsteady gait   Neurological:     Mental Status: She is alert. Mental status is at baseline.     Gait: Gait abnormal.  Psychiatric:        Mood and Affect: Mood normal.        Behavior: Behavior normal.    Labs reviewed: Recent Labs    11/15/21 0250 11/16/21 0246 11/20/21 0510 11/21/21 0451 11/22/21 0439 11/23/21 0519 12/19/21 1020  NA 143   < > 143   < > 143 142 147*  K 3.5   < > 3.8   < > 3.8 4.1 4.1  CL 121*   < > 119*   < > 119* 118* 117*  CO2 17*   < > 18*   < > 18* 16* 17*  GLUCOSE 120*   < > 137*   < > 99 75 55*  BUN 65*   < > 63*   < > 61* 61* 57*  CREATININE 4.49*   < > 4.51*   < > 4.35* 4.36* 4.35*  CALCIUM 7.2*   < > 7.4*   < > 7.2* 6.9* 7.0*  MG  --    < > 2.8*   < > 2.8* 2.9* 3.0*  PHOS 6.4*  --  6.8*  --    --   --   --    < > = values in this interval not displayed.   Recent Labs    11/14/21 0233 11/15/21 0250 11/20/21 0510 12/19/21 1020  AST 28  --  65* 33  ALT 14  --  27 17  ALKPHOS 107  --  144* 168*  BILITOT 0.7  --  0.4 0.3  PROT 4.7*  --  4.7* 5.1*  ALBUMIN 1.7* 1.8* 1.7* 2.2*   Recent Labs    08/27/21 0956 11/06/21 1617 11/08/21  2357 11/10/21 0240 11/22/21 0439 11/23/21 0519 12/19/21 1020  WBC 5.7 2.8* 2.8*   < > 4.8 4.6 4.0  NEUTROABS 3,865 2,041 2.2  --   --   --   --   HGB 8.3* 9.6* 9.1*   < > 7.5* 7.6* 8.1*  HCT 27.0* 29.3* 28.1*   < > 24.7* 24.6* 24.3*  MCV 84.6 81.6 83.6   < > 90.1 90.1 81  PLT 247 CANCELED 56*   < > 198 210 64*   < > = values in this interval not displayed.   Lab Results  Component Value Date   TSH 3.941 11/09/2021   No results found for: HGBA1C Lab Results  Component Value Date   CHOL 212 (H) 08/27/2021   HDL 67 08/27/2021   LDLCALC 128 (H) 08/27/2021   TRIG 78 08/27/2021   CHOLHDL 3.2 08/27/2021    Significant Diagnostic Results in last 30 days:  No results found.  Assessment/Plan  1. Encounter for completion of form with patient FMLA for completed.Daughter is the primary care of patient who assist with her activity of daily living.also assist patient driving back and froth to medical visit.Currently being followed up by Physical Therapy and Occupational Therapy.  Completed FMLA a paperwork given to [employer] and to be faxed to [place] and daughter to pick up original prescription.  2. Chronic combined systolic and diastolic CHF (congestive heart failure) (HCC) Has lower extremities she requires an electric lift chair to enable her to transfer in and out of chair and also to keep head elevated and lower extremities elevated due to edema. - For home use only DME Other see comment: Electric reclining lift chair.  Prescription written and given to [place] to fax to [place].Patient and daughter to pick up  original prescription  Family/ staff Communication: Reviewed plan of care with patient and daughter verbalized understanding.  Labs/tests ordered: None   Next Appointment: As needed as symptoms worsen or fail to improve  I connected with  [name] on 12/20/21 by a video enabled telemedicine application and verified that I am speaking with the correct person using two identifiers.   I discussed the limitations of evaluation and management by telemedicine. The patient expressed understanding and agreed to proceed.  Spent 20 minutes of face to face with patient  >50% time spent counseling; reviewing medical record and developing future plan of care.   [name], NP

## 2021-12-20 NOTE — Telephone Encounter (Signed)
Spoke with Stanton Kidney from [facility] who reports that patient's daughter got BP readings this morning from ranging from 75/62 - 90/74. Mary had patient drink 10 ounces of fluid. BP 104/68. Patient alert and oriented, denies pain, nausea, dizziness, or lightheadedness. The morning dose of carvedilol was held. Please advise. ?

## 2021-12-21 ENCOUNTER — Encounter: Payer: Self-pay | Admitting: Cardiology

## 2021-12-21 ENCOUNTER — Other Ambulatory Visit: Payer: Self-pay

## 2021-12-21 ENCOUNTER — Inpatient Hospital Stay (HOSPITAL_COMMUNITY)
Admission: EM | Admit: 2021-12-21 | Discharge: 2022-01-03 | DRG: 871 | Disposition: A | Discharge status: Home Health | Payer: Medicare (Managed Care) | Attending: Internal Medicine | Admitting: Internal Medicine

## 2021-12-21 DIAGNOSIS — J1282 Pneumonia due to coronavirus disease 2019: Secondary | ICD-10-CM | POA: Diagnosis present

## 2021-12-21 DIAGNOSIS — I4891 Unspecified atrial fibrillation: Secondary | ICD-10-CM | POA: Diagnosis not present

## 2021-12-21 DIAGNOSIS — R9431 Abnormal electrocardiogram [ECG] [EKG]: Secondary | ICD-10-CM | POA: Diagnosis not present

## 2021-12-21 DIAGNOSIS — A419 Sepsis, unspecified organism: Secondary | ICD-10-CM | POA: Diagnosis present

## 2021-12-21 DIAGNOSIS — D631 Anemia in chronic kidney disease: Secondary | ICD-10-CM | POA: Diagnosis not present

## 2021-12-21 DIAGNOSIS — J90 Pleural effusion, not elsewhere classified: Secondary | ICD-10-CM | POA: Diagnosis not present

## 2021-12-21 DIAGNOSIS — D649 Anemia, unspecified: Secondary | ICD-10-CM | POA: Diagnosis present

## 2021-12-21 DIAGNOSIS — Z7189 Other specified counseling: Secondary | ICD-10-CM | POA: Diagnosis not present

## 2021-12-21 DIAGNOSIS — I959 Hypotension, unspecified: Secondary | ICD-10-CM | POA: Diagnosis not present

## 2021-12-21 DIAGNOSIS — R0689 Other abnormalities of breathing: Secondary | ICD-10-CM | POA: Diagnosis not present

## 2021-12-21 DIAGNOSIS — R339 Retention of urine, unspecified: Secondary | ICD-10-CM | POA: Diagnosis not present

## 2021-12-21 DIAGNOSIS — N185 Chronic kidney disease, stage 5: Secondary | ICD-10-CM | POA: Diagnosis present

## 2021-12-21 DIAGNOSIS — I5042 Chronic combined systolic (congestive) and diastolic (congestive) heart failure: Secondary | ICD-10-CM | POA: Diagnosis present

## 2021-12-21 DIAGNOSIS — Z7401 Bed confinement status: Secondary | ICD-10-CM | POA: Diagnosis not present

## 2021-12-21 DIAGNOSIS — T68XXXA Hypothermia, initial encounter: Secondary | ICD-10-CM | POA: Diagnosis not present

## 2021-12-21 DIAGNOSIS — Z79899 Other long term (current) drug therapy: Secondary | ICD-10-CM | POA: Diagnosis not present

## 2021-12-21 DIAGNOSIS — Z515 Encounter for palliative care: Secondary | ICD-10-CM | POA: Diagnosis not present

## 2021-12-21 DIAGNOSIS — Z9849 Cataract extraction status, unspecified eye: Secondary | ICD-10-CM

## 2021-12-21 DIAGNOSIS — R6520 Severe sepsis without septic shock: Secondary | ICD-10-CM | POA: Diagnosis not present

## 2021-12-21 DIAGNOSIS — I43 Cardiomyopathy in diseases classified elsewhere: Secondary | ICD-10-CM | POA: Diagnosis present

## 2021-12-21 DIAGNOSIS — E872 Acidosis, unspecified: Secondary | ICD-10-CM | POA: Diagnosis present

## 2021-12-21 DIAGNOSIS — Z888 Allergy status to other drugs, medicaments and biological substances status: Secondary | ICD-10-CM

## 2021-12-21 DIAGNOSIS — A4189 Other specified sepsis: Principal | ICD-10-CM | POA: Diagnosis present

## 2021-12-21 DIAGNOSIS — L8990 Pressure ulcer of unspecified site, unspecified stage: Secondary | ICD-10-CM | POA: Insufficient documentation

## 2021-12-21 DIAGNOSIS — D72822 Plasmacytosis: Secondary | ICD-10-CM | POA: Diagnosis present

## 2021-12-21 DIAGNOSIS — I482 Chronic atrial fibrillation, unspecified: Secondary | ICD-10-CM | POA: Diagnosis not present

## 2021-12-21 DIAGNOSIS — D6959 Other secondary thrombocytopenia: Secondary | ICD-10-CM | POA: Diagnosis present

## 2021-12-21 DIAGNOSIS — E0781 Sick-euthyroid syndrome: Secondary | ICD-10-CM | POA: Diagnosis not present

## 2021-12-21 DIAGNOSIS — I4821 Permanent atrial fibrillation: Secondary | ICD-10-CM | POA: Diagnosis not present

## 2021-12-21 DIAGNOSIS — I517 Cardiomegaly: Secondary | ICD-10-CM | POA: Diagnosis not present

## 2021-12-21 DIAGNOSIS — E785 Hyperlipidemia, unspecified: Secondary | ICD-10-CM | POA: Diagnosis present

## 2021-12-21 DIAGNOSIS — R531 Weakness: Secondary | ICD-10-CM | POA: Diagnosis not present

## 2021-12-21 DIAGNOSIS — I132 Hypertensive heart and chronic kidney disease with heart failure and with stage 5 chronic kidney disease, or end stage renal disease: Secondary | ICD-10-CM | POA: Diagnosis present

## 2021-12-21 DIAGNOSIS — U071 COVID-19: Secondary | ICD-10-CM | POA: Diagnosis present

## 2021-12-21 DIAGNOSIS — R4182 Altered mental status, unspecified: Secondary | ICD-10-CM | POA: Diagnosis not present

## 2021-12-21 DIAGNOSIS — R680 Hypothermia, not associated with low environmental temperature: Secondary | ICD-10-CM | POA: Diagnosis not present

## 2021-12-21 DIAGNOSIS — I9589 Other hypotension: Secondary | ICD-10-CM | POA: Diagnosis not present

## 2021-12-21 DIAGNOSIS — I480 Paroxysmal atrial fibrillation: Secondary | ICD-10-CM | POA: Diagnosis present

## 2021-12-21 MED ORDER — FUROSEMIDE 40 MG PO TABS: ORAL_TABLET | ORAL | 3 refills | Status: DC

## 2021-12-21 NOTE — ED Triage Notes (Signed)
Patient presents with daughter. Per daughter, patient recently admitted for sepsis approx. 28 days ago. Daughter noted today that patient was cold today with labile BPs. Patient denies symptoms currently.  ?

## 2021-12-21 NOTE — Telephone Encounter (Signed)
Spoke to daughter (ok per DPR)-she states BP was 147/94 this morning, so she gave carvedilol 3.125 mg.     About 1 hour after BP 132/86.   Patient remains asymptomatic.     She states she has been giving carvedilol based on her BP reading.  Yesterday she had to hold it all day, but had to give it this AM.  Advised would discuss with MD further and call back.   ? ?Also discussed lab results and recommendations, daughter verbalized understanding.  Med list updated and lab orders placed for repeat in 2 weeks.    ? ? ?

## 2021-12-22 ENCOUNTER — Emergency Department (HOSPITAL_COMMUNITY): Payer: Medicare (Managed Care)

## 2021-12-22 ENCOUNTER — Encounter (HOSPITAL_COMMUNITY): Payer: Self-pay | Admitting: Emergency Medicine

## 2021-12-22 DIAGNOSIS — R6520 Severe sepsis without septic shock: Secondary | ICD-10-CM

## 2021-12-22 DIAGNOSIS — N185 Chronic kidney disease, stage 5: Secondary | ICD-10-CM | POA: Diagnosis present

## 2021-12-22 DIAGNOSIS — I132 Hypertensive heart and chronic kidney disease with heart failure and with stage 5 chronic kidney disease, or end stage renal disease: Secondary | ICD-10-CM | POA: Diagnosis present

## 2021-12-22 DIAGNOSIS — D649 Anemia, unspecified: Secondary | ICD-10-CM | POA: Diagnosis present

## 2021-12-22 DIAGNOSIS — I4891 Unspecified atrial fibrillation: Secondary | ICD-10-CM | POA: Diagnosis not present

## 2021-12-22 DIAGNOSIS — R339 Retention of urine, unspecified: Secondary | ICD-10-CM | POA: Diagnosis not present

## 2021-12-22 DIAGNOSIS — J1282 Pneumonia due to coronavirus disease 2019: Secondary | ICD-10-CM | POA: Diagnosis present

## 2021-12-22 DIAGNOSIS — E785 Hyperlipidemia, unspecified: Secondary | ICD-10-CM | POA: Diagnosis present

## 2021-12-22 DIAGNOSIS — Z515 Encounter for palliative care: Secondary | ICD-10-CM | POA: Diagnosis not present

## 2021-12-22 DIAGNOSIS — I482 Chronic atrial fibrillation, unspecified: Secondary | ICD-10-CM | POA: Diagnosis present

## 2021-12-22 DIAGNOSIS — A419 Sepsis, unspecified organism: Secondary | ICD-10-CM | POA: Diagnosis present

## 2021-12-22 DIAGNOSIS — Z7189 Other specified counseling: Secondary | ICD-10-CM

## 2021-12-22 DIAGNOSIS — E872 Acidosis, unspecified: Secondary | ICD-10-CM | POA: Diagnosis present

## 2021-12-22 DIAGNOSIS — I43 Cardiomyopathy in diseases classified elsewhere: Secondary | ICD-10-CM | POA: Diagnosis present

## 2021-12-22 DIAGNOSIS — Z888 Allergy status to other drugs, medicaments and biological substances status: Secondary | ICD-10-CM | POA: Diagnosis not present

## 2021-12-22 DIAGNOSIS — T68XXXA Hypothermia, initial encounter: Secondary | ICD-10-CM

## 2021-12-22 DIAGNOSIS — R9431 Abnormal electrocardiogram [ECG] [EKG]: Secondary | ICD-10-CM | POA: Diagnosis present

## 2021-12-22 DIAGNOSIS — R531 Weakness: Secondary | ICD-10-CM | POA: Diagnosis not present

## 2021-12-22 DIAGNOSIS — U071 COVID-19: Secondary | ICD-10-CM | POA: Diagnosis present

## 2021-12-22 DIAGNOSIS — D631 Anemia in chronic kidney disease: Secondary | ICD-10-CM | POA: Diagnosis present

## 2021-12-22 DIAGNOSIS — A4189 Other specified sepsis: Secondary | ICD-10-CM | POA: Diagnosis present

## 2021-12-22 DIAGNOSIS — I4821 Permanent atrial fibrillation: Secondary | ICD-10-CM | POA: Diagnosis not present

## 2021-12-22 DIAGNOSIS — I5042 Chronic combined systolic (congestive) and diastolic (congestive) heart failure: Secondary | ICD-10-CM

## 2021-12-22 DIAGNOSIS — Z79899 Other long term (current) drug therapy: Secondary | ICD-10-CM | POA: Diagnosis not present

## 2021-12-22 DIAGNOSIS — E0781 Sick-euthyroid syndrome: Secondary | ICD-10-CM | POA: Diagnosis not present

## 2021-12-22 DIAGNOSIS — I959 Hypotension, unspecified: Secondary | ICD-10-CM | POA: Diagnosis not present

## 2021-12-22 DIAGNOSIS — D6959 Other secondary thrombocytopenia: Secondary | ICD-10-CM | POA: Diagnosis present

## 2021-12-22 DIAGNOSIS — D72822 Plasmacytosis: Secondary | ICD-10-CM | POA: Diagnosis present

## 2021-12-22 DIAGNOSIS — Z9849 Cataract extraction status, unspecified eye: Secondary | ICD-10-CM | POA: Diagnosis not present

## 2021-12-22 DIAGNOSIS — I480 Paroxysmal atrial fibrillation: Secondary | ICD-10-CM | POA: Diagnosis present

## 2021-12-22 LAB — PROCALCITONIN: Procalcitonin: <0.1 ng/mL

## 2021-12-22 LAB — CBC WITH DIFFERENTIAL/PLATELET
Abs Immature Granulocytes: 0.01 10*3/uL (ref 0.00–0.07)
Basophils Absolute: 0 10*3/uL (ref 0.0–0.1)
Basophils Relative: 0 %
Eosinophils Absolute: 0.1 10*3/uL (ref 0.0–0.5)
Eosinophils Relative: 2 %
HCT: 25.2 % — ABNORMAL LOW (ref 36.0–46.0)
Hemoglobin: 8 g/dL — ABNORMAL LOW (ref 12.0–15.0)
Immature Granulocytes: 0 %
Lymphocytes Relative: 20 %
Lymphs Abs: 0.8 10*3/uL (ref 0.7–4.0)
MCH: 27.5 pg (ref 26.0–34.0)
MCHC: 31.7 g/dL (ref 30.0–36.0)
MCV: 86.6 fL (ref 80.0–100.0)
Monocytes Absolute: 0.2 10*3/uL (ref 0.1–1.0)
Monocytes Relative: 5 %
Neutro Abs: 3 10*3/uL (ref 1.7–7.7)
Neutrophils Relative %: 73 %
Platelets: 57 10*3/uL — ABNORMAL LOW (ref 150–400)
RBC: 2.91 MIL/uL — ABNORMAL LOW (ref 3.87–5.11)
RDW: 18.7 % — ABNORMAL HIGH (ref 11.5–15.5)
Smear Review: DECREASED
WBC: 4 10*3/uL (ref 4.0–10.5)
nRBC: 0.8 % — ABNORMAL HIGH (ref 0.0–0.2)

## 2021-12-22 LAB — URINALYSIS, ROUTINE W REFLEX MICROSCOPIC
Bilirubin Urine: NEGATIVE
Glucose, UA: 150 mg/dL — AB
Ketones, ur: NEGATIVE mg/dL
Leukocytes,Ua: NEGATIVE
Nitrite: NEGATIVE
Protein, ur: >=300 mg/dL — AB
Specific Gravity, Urine: 1.022 (ref 1.005–1.030)
pH: 5 (ref 5.0–8.0)

## 2021-12-22 LAB — COMPREHENSIVE METABOLIC PANEL
ALT: 17 U/L (ref 0–44)
AST: 35 U/L (ref 15–41)
Albumin: 1.9 g/dL — ABNORMAL LOW (ref 3.5–5.0)
Alkaline Phosphatase: 138 U/L — ABNORMAL HIGH (ref 38–126)
Anion gap: 6 (ref 5–15)
BUN: 67 mg/dL — ABNORMAL HIGH (ref 8–23)
CO2: 18 mmol/L — ABNORMAL LOW (ref 22–32)
Calcium: 6.6 mg/dL — ABNORMAL LOW (ref 8.9–10.3)
Chloride: 116 mmol/L — ABNORMAL HIGH (ref 98–111)
Creatinine, Ser: 4.77 mg/dL — ABNORMAL HIGH (ref 0.44–1.00)
GFR, Estimated: 9 mL/min — ABNORMAL LOW (ref >60)
Glucose, Bld: 76 mg/dL (ref 70–99)
Potassium: 3.8 mmol/L (ref 3.5–5.1)
Sodium: 140 mmol/L (ref 135–145)
Total Bilirubin: 0.2 mg/dL — ABNORMAL LOW (ref 0.3–1.2)
Total Protein: 5.7 g/dL — ABNORMAL LOW (ref 6.5–8.1)

## 2021-12-22 LAB — HEPARIN LEVEL (UNFRACTIONATED)
Heparin Unfractionated: 0.96 IU/mL — ABNORMAL HIGH (ref 0.30–0.70)
Heparin Unfractionated: >1.1 IU/mL — ABNORMAL HIGH (ref 0.30–0.70)

## 2021-12-22 LAB — RESP PANEL BY RT-PCR (FLU A&B, COVID) ARPGX2
Influenza A by PCR: NEGATIVE
Influenza B by PCR: NEGATIVE
SARS Coronavirus 2 by RT PCR: POSITIVE — AB

## 2021-12-22 LAB — D-DIMER, QUANTITATIVE: D-Dimer, Quant: 2.75 ug/mL-FEU — ABNORMAL HIGH (ref 0.00–0.50)

## 2021-12-22 LAB — CORTISOL: Cortisol, Plasma: 12.8 ug/dL

## 2021-12-22 LAB — PROTIME-INR
INR: 1.1 (ref 0.8–1.2)
Prothrombin Time: 13.9 seconds (ref 11.4–15.2)

## 2021-12-22 LAB — CBG MONITORING, ED: Glucose-Capillary: 73 mg/dL (ref 70–99)

## 2021-12-22 LAB — TSH: TSH: 7.613 u[IU]/mL — ABNORMAL HIGH (ref 0.350–4.500)

## 2021-12-22 LAB — LACTIC ACID, PLASMA
Lactic Acid, Venous: 0.4 mmol/L — ABNORMAL LOW (ref 0.5–1.9)
Lactic Acid, Venous: 0.6 mmol/L (ref 0.5–1.9)

## 2021-12-22 MED ORDER — SENNOSIDES-DOCUSATE SODIUM 8.6-50 MG PO TABS: 1.0000 | ORAL_TABLET | Freq: Every evening | ORAL | Status: DC | PRN

## 2021-12-22 MED ORDER — SODIUM CHLORIDE 0.9 % IV SOLN
250.0000 mL | INTRAVENOUS | Status: DC | PRN
  Administered 2021-12-26: 250 mL via INTRAVENOUS

## 2021-12-22 MED ORDER — CARVEDILOL 3.125 MG PO TABS
3.1250 mg | ORAL_TABLET | Freq: Two times a day (BID) | ORAL | Status: DC
  Administered 2021-12-22 – 2022-01-03 (×21): 3.125 mg via ORAL
  Filled 2021-12-22 (×25): qty 1

## 2021-12-22 MED ORDER — SODIUM CHLORIDE 0.9% FLUSH
3.0000 mL | Freq: Two times a day (BID) | INTRAVENOUS | Status: DC
  Administered 2021-12-22 – 2022-01-02 (×21): 3 mL via INTRAVENOUS

## 2021-12-22 MED ORDER — ADULT MULTIVITAMIN W/MINERALS CH
1.0000 | ORAL_TABLET | Freq: Every day | ORAL | Status: DC
  Administered 2021-12-22 – 2022-01-03 (×13): 1 via ORAL
  Filled 2021-12-22 (×13): qty 1

## 2021-12-22 MED ORDER — SODIUM CHLORIDE 0.9 % IV SOLN
100.0000 mg | Freq: Every day | INTRAVENOUS | Status: AC
  Administered 2021-12-23 – 2021-12-24 (×2): 100 mg via INTRAVENOUS
  Filled 2021-12-22 (×2): qty 20

## 2021-12-22 MED ORDER — ACETAMINOPHEN 325 MG PO TABS: 650.0000 mg | ORAL_TABLET | Freq: Four times a day (QID) | ORAL | Status: DC | PRN

## 2021-12-22 MED ORDER — FUROSEMIDE 10 MG/ML IJ SOLN
60.0000 mg | Freq: Two times a day (BID) | INTRAMUSCULAR | Status: DC
  Administered 2021-12-22: 60 mg via INTRAVENOUS
  Filled 2021-12-22: qty 8

## 2021-12-22 MED ORDER — THIAMINE HCL 100 MG/ML IJ SOLN
100.0000 mg | Freq: Once | INTRAMUSCULAR | Status: AC
Start: 2021-12-22 — End: 2021-12-22
  Administered 2021-12-22: 100 mg via INTRAVENOUS
  Filled 2021-12-22: qty 2

## 2021-12-22 MED ORDER — HEPARIN (PORCINE) 25000 UT/250ML-% IV SOLN: 1050.0000 [IU]/h | INTRAVENOUS | Status: DC

## 2021-12-22 MED ORDER — HEPARIN BOLUS VIA INFUSION
2000.0000 [IU] | Freq: Once | INTRAVENOUS | Status: AC
  Administered 2021-12-22: 2000 [IU] via INTRAVENOUS
  Filled 2021-12-22: qty 2000

## 2021-12-22 MED ORDER — SODIUM CHLORIDE 0.9 % IV SOLN
2.0000 g | Freq: Once | INTRAVENOUS | Status: AC
  Administered 2021-12-22: 2 g via INTRAVENOUS
  Filled 2021-12-22: qty 2

## 2021-12-22 MED ORDER — SODIUM CHLORIDE 0.9 % IV SOLN
100.0000 mg | Freq: Once | INTRAVENOUS | Status: AC
  Administered 2021-12-22: 100 mg via INTRAVENOUS
  Filled 2021-12-22: qty 20

## 2021-12-22 MED ORDER — FOLIC ACID 1 MG PO TABS
1.0000 mg | ORAL_TABLET | Freq: Every day | ORAL | Status: DC
  Administered 2021-12-22 – 2022-01-03 (×13): 1 mg via ORAL
  Filled 2021-12-22 (×13): qty 1

## 2021-12-22 MED ORDER — SODIUM CHLORIDE 0.9% FLUSH
3.0000 mL | INTRAVENOUS | Status: DC | PRN
Start: 2021-12-22 — End: 2022-01-03

## 2021-12-22 MED ORDER — GUAIFENESIN-DM 100-10 MG/5ML PO SYRP: 10.0000 mL | ORAL_SOLUTION | Freq: Four times a day (QID) | ORAL | Status: DC | PRN

## 2021-12-22 MED ORDER — HEPARIN (PORCINE) 25000 UT/250ML-% IV SOLN
1200.0000 [IU]/h | INTRAVENOUS | Status: DC
  Administered 2021-12-22: 1200 [IU]/h via INTRAVENOUS
  Filled 2021-12-22: qty 250

## 2021-12-22 MED ORDER — ORAL CARE MOUTH RINSE
15.0000 mL | Freq: Two times a day (BID) | OROMUCOSAL | Status: DC
  Administered 2021-12-22 – 2022-01-03 (×18): 15 mL via OROMUCOSAL

## 2021-12-22 MED ORDER — HEPARIN (PORCINE) 25000 UT/250ML-% IV SOLN
850.0000 [IU]/h | INTRAVENOUS | Status: DC
Start: 2021-12-22 — End: 2021-12-26
  Administered 2021-12-24 – 2021-12-25 (×2): 850 [IU]/h via INTRAVENOUS
  Filled 2021-12-22 (×4): qty 250

## 2021-12-22 MED ORDER — HYDROCORTISONE SOD SUC (PF) 100 MG IJ SOLR
100.0000 mg | Freq: Once | INTRAMUSCULAR | Status: AC
Start: 2021-12-22 — End: 2021-12-22
  Administered 2021-12-22: 100 mg via INTRAVENOUS
  Filled 2021-12-22: qty 2

## 2021-12-22 MED ORDER — VANCOMYCIN HCL IN DEXTROSE 1-5 GM/200ML-% IV SOLN
1000.0000 mg | Freq: Once | INTRAVENOUS | Status: AC
  Administered 2021-12-22: 1000 mg via INTRAVENOUS
  Filled 2021-12-22: qty 200

## 2021-12-22 NOTE — Progress Notes (Signed)
ANTICOAGULATION CONSULT NOTE ? ?Pharmacy Consult for heparin ?Indication: atrial fibrillation ? ?Allergies  ?Allergen Reactions  ? Chlorhexidine Other (See Comments)  ?  Unknown reaction  ? ? ?Patient Measurements: ?  ?Heparin Dosing Weight: 85.3kg ? ?Vital Signs: ?Temp: 97.3 ?F (36.3 ?C) (03/11 1200) ?Temp Source: Bladder (03/11 1200) ?BP: 135/84 (03/11 1200) ?Pulse Rate: 73 (03/11 1200) ? ?Labs: ?Recent Labs  ?  12/22/21 ?0010 12/22/21 ?1141  ?HGB 8.0*  --   ?HCT 25.2*  --   ?PLT 57*  --   ?LABPROT 13.9  --   ?INR 1.1  --   ?HEPARINUNFRC  --  0.96*  ?CREATININE 4.77*  --   ? ? ? ?Estimated Creatinine Clearance: 12 mL/min (A) (by C-G formula based on SCr of 4.77 mg/dL (H)). ? ? ?Medical History: ?Past Medical History:  ?Diagnosis Date  ? Chronic combined systolic and diastolic congestive heart failure ([place]) 11/09/2021  ? Generalized anxiety disorder   ? Hyperlipidemia 11/09/2021  ? Hypertension   ? Stage 3b chronic kidney disease (CKD) ([place]) 11/09/2021  ? ? ? ?Assessment: ?71 yo pt with new onset afib, pharmacy consulted to dose heparin.  History of CKD stage V and anemia of chronic disease.  No prior AC noted.  Thrombocytopenia noted to be likely due to sepsis. ? ?Today, 12/22/21 ?11:41 Heparin level supra-therapeutic at 0.96 with heparin infusing at 1200 units/hour ?Hgb 8.0, plt low at 57 (plt 64 on 3/8), Scr 4.77 ? ? ?Goal of Therapy:  ?Heparin level 0.3-0.7 units/ml ?Monitor platelets by anticoagulation protocol: Yes ?  ?Plan:  ?Decrease heparin to 1050 units/hr ?Heparin level in 8 hours ?Monitor daily heparin level, CBC, signs/symptoms of bleeding ? ? ?Royetta Asal, PharmD, BCPS ?Clinical Pharmacist ?[place] ?Please utilize Amion for appropriate phone number to reach the unit pharmacist ([place]) ?12/22/2021 12:30 PM ? ? ? ? ?

## 2021-12-22 NOTE — Progress Notes (Signed)
ANTICOAGULATION CONSULT NOTE - Initial Consult ? ?Pharmacy Consult for heparin ?Indication: atrial fibrillation ? ?Allergies  ?Allergen Reactions  ? Chlorhexidine   ? ? ?Patient Measurements: ?  ?Heparin Dosing Weight: 85.3kg ? ?Vital Signs: ?Temp: 92.5 ?F (33.6 ?C) (03/11 0300) ?Temp Source: Rectal (03/10 2357) ?BP: 131/87 (03/11 0300) ?Pulse Rate: 25 (03/11 0300) ? ?Labs: ?Recent Labs  ?  12/19/21 ?1020 12/22/21 ?0010  ?HGB 8.1* 8.0*  ?HCT 24.3* 25.2*  ?PLT 64* 57*  ?LABPROT  --  13.9  ?INR  --  1.1  ?CREATININE 4.35* 4.77*  ? ? ?Estimated Creatinine Clearance: 12 mL/min (A) (by C-G formula based on SCr of 4.77 mg/dL (H)). ? ? ?Medical History: ?Past Medical History:  ?Diagnosis Date  ? Chronic combined systolic and diastolic congestive heart failure ([place]) 11/09/2021  ? Generalized anxiety disorder   ? Hyperlipidemia 11/09/2021  ? Hypertension   ? Stage 3b chronic kidney disease (CKD) ([place]) 11/09/2021  ? ? ? ?Assessment: ?71 yo pt with new onset afib, pharmacy consulted to dose heparin.  No prior AC noted ? ?Hgb 8.0, plts 57, Scr 4.77 ? ? ?Goal of Therapy:  ?Heparin level 0.3-0.7 units/ml ?Monitor platelets by anticoagulation protocol: Yes ?  ?Plan:  ?Heparin bolus 2000 units x 1 ?Start heparin drip at 1200 units/hr ?Heparin level in 8 hours ?Daily CBC ? ?Dolly Rias RPh ?12/22/2021, 3:17 AM ? ? ? ?

## 2021-12-22 NOTE — Assessment & Plan Note (Signed)
Monitor on telemetry.  ?Avoid medications that can further prolong QT interval if possible ?

## 2021-12-22 NOTE — Progress Notes (Signed)
A consult was received from an ED physician for vancomycin and cefepime per pharmacy dosing.  The patient's profile has been reviewed for ht/wt/allergies/indication/available labs.   ?A one time order has been placed for vancomycin 1gm and cefepime 2gm.   ? ?Further antibiotics/pharmacy consults should be ordered by admitting physician if indicated.       ?                ?Thank you, ?Dolly Rias RPh ?12/22/2021, 12:45 AM ? ?

## 2021-12-22 NOTE — ED Provider Notes (Signed)
[facility] DEPT Provider Note: Georgena Spurling, MD, FACEP  CSN: [id number] MRN: [id number] ARRIVAL: 12/21/21 at 2344 ROOM: [place]  Possible UTI   HISTORY OF PRESENT ILLNESS  12/22/21 12:00 AM Kara Mullins is a 71 y.o. female with multiple medical problems who was admitted on 11/08/2021 for sepsis.  She was discharged on 11/23/2021.  Her daughter brings her in because she felt cold today and had labile blood pressures.  Specifically her daughter measured an oral temperature of 89 degrees and her blood pressures varied from 106/72 to 144/88.  Her daughter is concerned she may be getting septic again.  She has no other specific symptoms.  She has had a normal mental status.  She was found to have a rectal temperature of 91 on arrival and was placed on a Bair Hugger for rewarming.  She was noted to be bradycardic initially with a heart rate of 42 but it has gone up to 60 subsequently.  She has chronic lower extremity edema which is somewhat less prominent than baseline.  The sepsis protocol was initiated in triage but she did not qualify as a code sepsis.   Past Medical History:  Diagnosis Date   Chronic combined systolic and diastolic congestive heart failure ([place]) 11/09/2021   Generalized anxiety disorder    Hyperlipidemia 11/09/2021   Hypertension    Stage 3b chronic kidney disease (CKD) ([place]) 11/09/2021    Past Surgical History:  Procedure Laterality Date   CATARACT EXTRACTION, BILATERAL     combined systolic and diastolic congestive heart failure       Family History  Problem Relation Age of Onset   Throat cancer Father     Social History   Tobacco Use   Smoking status: Never   Smokeless tobacco: Never  Vaping Use   Vaping Use: Never used  Substance Use Topics   Alcohol use: Not Currently   Drug use: Never    Prior to Admission medications   Medication Sig Start Date End Date Taking? Authorizing Provider  carvedilol (COREG) 3.125 MG tablet  Take 1 tablet (3.125 mg total) by mouth 2 (two) times daily with a meal. 12/19/21 03/19/22  Donato Heinz, MD  furosemide (LASIX) 40 MG tablet Take furosemide 40 mg daily 5 days a week-DO NOT take on Tuesdays or Fridays. 12/21/21   Donato Heinz, MD  Multiple Vitamin (MULTIVITAMIN WITH MINERALS) TABS tablet Take 1 tablet by mouth daily. 11/24/21   Pokhrel, Corrie Mckusick, MD  sodium bicarbonate 650 MG tablet Take 1 tablet (650 mg total) by mouth 3 (three) times daily. 11/23/21   Pokhrel, Corrie Mckusick, MD    Allergies Chlorhexidine   REVIEW OF SYSTEMS  Negative except as noted here or in the History of Present Illness.   PHYSICAL EXAMINATION  Initial Vital Signs Blood pressure (!) 147/112, pulse (!) 42, temperature 98.1 F (36.7 C), temperature source Oral, resp. rate 20, SpO2 100 %.  Examination General: Well-developed, well-nourished female in no acute distress; appearance consistent with age of record HENT: normocephalic; atraumatic Eyes: pupils equal, round and reactive to light; extraocular muscles intact; bilateral pseudophakia Neck: supple Heart: regular rate and rhythm Lungs: clear to auscultation bilaterally Abdomen: soft; nondistended; nontender; bowel sounds present GU: No CVA tenderness Extremities: No deformity; full range of motion; 2+ edema of lower legs and feet Neurologic: Awake, alert and oriented; motor function intact in all extremities and symmetric; no facial droop Skin: Warm and dry Psychiatric: Normal mood and affect   RESULTS  Summary of this visit's results, reviewed and interpreted by myself:   EKG Interpretation  Date/Time:  Saturday December 22 2021 00:06:01 EST Ventricular Rate:  64 PR Interval:    QRS Duration: 111 QT Interval:  489 QTC Calculation: 505 R Axis:   48 Text Interpretation: Atrial fibrillation Low voltage, extremity leads LVH with secondary repolarization abnormality Prolonged QT interval Previously NSR Confirmed by Shanon Rosser  [id number]) on 12/22/2021 12:13:07 AM       Laboratory Studies: Results for orders placed or performed during the hospital encounter of 12/21/21 (from the past 24 hour(s))  CBG monitoring, ED     Status: None   Collection Time: 12/21/21 11:59 PM  Result Value Ref Range   Glucose-Capillary 73 70 - 99 mg/dL  Comprehensive metabolic panel     Status: Abnormal   Collection Time: 12/22/21 12:10 AM  Result Value Ref Range   Sodium 140 135 - 145 mmol/L   Potassium 3.8 3.5 - 5.1 mmol/L   Chloride 116 (H) 98 - 111 mmol/L   CO2 18 (L) 22 - 32 mmol/L   Glucose, Bld 76 70 - 99 mg/dL   BUN 67 (H) 8 - 23 mg/dL   Creatinine, Ser 4.77 (H) 0.44 - 1.00 mg/dL   Calcium 6.6 (L) 8.9 - 10.3 mg/dL   Total Protein 5.7 (L) 6.5 - 8.1 g/dL   Albumin 1.9 (L) 3.5 - 5.0 g/dL   AST 35 15 - 41 U/L   ALT 17 0 - 44 U/L   Alkaline Phosphatase 138 (H) 38 - 126 U/L   Total Bilirubin 0.2 (L) 0.3 - 1.2 mg/dL   GFR, Estimated 9 (L) >60 mL/min   Anion gap 6 5 - 15  Lactic acid, plasma     Status: Abnormal   Collection Time: 12/22/21 12:10 AM  Result Value Ref Range   Lactic Acid, Venous 0.4 (L) 0.5 - 1.9 mmol/L  CBC with Differential     Status: Abnormal   Collection Time: 12/22/21 12:10 AM  Result Value Ref Range   WBC 4.0 4.0 - 10.5 K/uL   RBC 2.91 (L) 3.87 - 5.11 MIL/uL   Hemoglobin 8.0 (L) 12.0 - 15.0 g/dL   HCT 25.2 (L) 36.0 - 46.0 %   MCV 86.6 80.0 - 100.0 fL   MCH 27.5 26.0 - 34.0 pg   MCHC 31.7 30.0 - 36.0 g/dL   RDW 18.7 (H) 11.5 - 15.5 %   Platelets 57 (L) 150 - 400 K/uL   nRBC 0.8 (H) 0.0 - 0.2 %   Neutrophils Relative % 73 %   Neutro Abs 3.0 1.7 - 7.7 K/uL   Lymphocytes Relative 20 %   Lymphs Abs 0.8 0.7 - 4.0 K/uL   Monocytes Relative 5 %   Monocytes Absolute 0.2 0.1 - 1.0 K/uL   Eosinophils Relative 2 %   Eosinophils Absolute 0.1 0.0 - 0.5 K/uL   Basophils Relative 0 %   Basophils Absolute 0.0 0.0 - 0.1 K/uL   Smear Review PLATELETS APPEAR DECREASED    nRBC HIDE 0 /100 WBC   Immature  Granulocytes 0 %   Abs Immature Granulocytes 0.01 0.00 - 0.07 K/uL   Schistocytes PRESENT   Protime-INR     Status: None   Collection Time: 12/22/21 12:10 AM  Result Value Ref Range   Prothrombin Time 13.9 11.4 - 15.2 seconds   INR 1.1 0.8 - 1.2  D-dimer, quantitative     Status: Abnormal   Collection Time: 12/22/21 12:10 AM  Result Value Ref Range   D-Dimer, Quant 2.75 (H) 0.00 - 0.50 ug/mL-FEU  Urinalysis, Routine w reflex microscopic     Status: Abnormal   Collection Time: 12/22/21 12:11 AM  Result Value Ref Range   Color, Urine YELLOW YELLOW   APPearance HAZY (A) CLEAR   Specific Gravity, Urine 1.022 1.005 - 1.030   pH 5.0 5.0 - 8.0   Glucose, UA 150 (A) NEGATIVE mg/dL   Hgb urine dipstick SMALL (A) NEGATIVE   Bilirubin Urine NEGATIVE NEGATIVE   Ketones, ur NEGATIVE NEGATIVE mg/dL   Protein, ur >=300 (A) NEGATIVE mg/dL   Nitrite NEGATIVE NEGATIVE   Leukocytes,Ua NEGATIVE NEGATIVE   RBC / HPF 6-10 0 - 5 RBC/hpf   WBC, UA 21-50 0 - 5 WBC/hpf   Bacteria, UA RARE (A) NONE SEEN   Squamous Epithelial / LPF 0-5 0 - 5   Mucus PRESENT    Hyaline Casts, UA PRESENT    Granular Casts, UA PRESENT   Resp Panel by RT-PCR (Flu A&B, Covid)     Status: Abnormal   Collection Time: 12/22/21 12:11 AM   Specimen: Nasopharyngeal(NP) swabs in vial transport medium  Result Value Ref Range   SARS Coronavirus 2 by RT PCR POSITIVE (A) NEGATIVE   Influenza A by PCR NEGATIVE NEGATIVE   Influenza B by PCR NEGATIVE NEGATIVE  Lactic acid, plasma     Status: None   Collection Time: 12/22/21  1:20 AM  Result Value Ref Range   Lactic Acid, Venous 0.6 0.5 - 1.9 mmol/L   Imaging Studies: CT Head Wo Contrast  Result Date: 12/22/2021 CLINICAL DATA:  Hypothermia.  History of sepsis. EXAM: CT HEAD WITHOUT CONTRAST TECHNIQUE: Contiguous axial images were obtained from the base of the skull through the vertex without intravenous contrast. RADIATION DOSE REDUCTION: This exam was performed according to the  departmental dose-optimization program which includes automated exposure control, adjustment of the mA and/or kV according to patient size and/or use of iterative reconstruction technique. COMPARISON:  CT 11/09/2021.  MRI 11/09/2021 FINDINGS: Brain: No evidence of acute infarction, hemorrhage, hydrocephalus, extra-axial collection or mass lesion/mass effect. Vascular: No hyperdense vessel or unexpected calcification. Skull: Normal. Negative for fracture or focal lesion. Sinuses/Orbits: Mucosal thickening in the paranasal sinuses. No acute air-fluid levels. Mastoid air cells are clear. Other: Congenital nonunion of the posterior arch of C1. IMPRESSION: No acute intracranial abnormalities.  No change since prior study. Electronically Signed   By: Lucienne Capers M.D.   On: 12/22/2021 02:46   DG Chest Portable 1 View  Result Date: 12/22/2021 CLINICAL DATA:  Sepsis EXAM: PORTABLE CHEST 1 VIEW COMPARISON:  11/13/2021 FINDINGS: Cardiac enlargement. Mild perihilar edema. Probable small pleural effusions. Atelectasis in the left base behind the heart. No pneumothorax. Calcification of the aorta. Appearances are similar to previous study, allowing for differences in technique. IMPRESSION: Cardiac enlargement with suggestion of mild pulmonary edema and small pleural effusions. Electronically Signed   By: Lucienne Capers M.D.   On: 12/22/2021 00:35    ED COURSE and MDM  Nursing notes, initial and subsequent vitals signs, including pulse oximetry, reviewed and interpreted by myself.  Vitals:   12/22/21 0425 12/22/21 0515 12/22/21 0600 12/22/21 0625  BP:  (!) 143/91 (!) 145/93   Pulse: 67 70 78 82  Resp: 14 13 12 13   Temp: (!) 93.6 F (34.2 C) (!) 94.3 F (34.6 C) (!) 94.8 F (34.9 C) (!) 95 F (35 C)  TempSrc:      SpO2: 100%  98% 98% 100%   Medications  heparin ADULT infusion 100 units/mL (25000 units/214mL) (1,200 Units/hr Intravenous New Bag/Given 12/22/21 0343)  remdesivir 100 mg in sodium chloride  0.9 % 100 mL IVPB (0 mg Intravenous Stopped 12/22/21 0439)    Followed by  remdesivir 100 mg in sodium chloride 0.9 % 100 mL IVPB (0 mg Intravenous Stopped 12/22/21 0439)    Followed by  remdesivir 100 mg in sodium chloride 0.9 % 100 mL IVPB (has no administration in time range)  carvedilol (COREG) tablet 3.125 mg (has no administration in time range)  furosemide (LASIX) injection 60 mg (60 mg Intravenous Given 12/22/21 0558)  sodium chloride flush (NS) 0.9 % injection 3 mL (has no administration in time range)  sodium chloride flush (NS) 0.9 % injection 3 mL (has no administration in time range)  0.9 %  sodium chloride infusion (has no administration in time range)  guaiFENesin-dextromethorphan (ROBITUSSIN DM) 100-10 MG/5ML syrup 10 mL (has no administration in time range)  acetaminophen (TYLENOL) tablet 650 mg (has no administration in time range)  senna-docusate (Senokot-S) tablet 1 tablet (has no administration in time range)  folic acid (FOLVITE) tablet 1 mg (has no administration in time range)  multivitamin with minerals tablet 1 tablet (has no administration in time range)  vancomycin (VANCOCIN) IVPB 1000 mg/200 mL premix (0 mg Intravenous Stopped 12/22/21 0143)  ceFEPIme (MAXIPIME) 2 g in sodium chloride 0.9 % 100 mL IVPB (0 g Intravenous Stopped 12/22/21 0106)  hydrocortisone sodium succinate (SOLU-CORTEF) 100 MG injection 100 mg (100 mg Intravenous Given 12/22/21 0145)  thiamine (B-1) injection 100 mg (100 mg Intravenous Given 12/22/21 0144)  heparin bolus via infusion 2,000 Units (2,000 Units Intravenous Bolus from Bag 12/22/21 0345)    12:22 AM Temperature Foley placed by nursing staff.  Mildly cloudy yellow urine obtained.  We will start antibiotics for possible sepsis.  Blood cultures and urine culture pending.  We will avoid fluid bolus at this time due to patient's known biventricular heart failure and chronic renal disease (creatinine 4.352 days ago, approximately the patient's  baseline).  1:02 AM Urinalysis concerning for urinary tract infection due to microscopic pyuria but nitrite and leukocytes are negative.   1:40 AM The cause of the patient's hypothermia is unclear.  We will check a TSH and a cortisol level and administrated 100 mg of Solu-Cortef case this represents a new onset adrenal crisis.  We will also check a CT scan to evaluate for hypothalamus bleed and administer thiamine.  2:13 AM The patient has no leukocytosis and her lactate is normal.  It is unclear if her hypothermia is caused by sepsis.  She has anemia but her hemoglobin and hematocrit are close to her baseline.   2:44 AM Head CT reviewed by myself.  No appearance of hypothalamic or other intracranial hemorrhage.  2:53 AM Patient positive for COVID-19.  Hypothermia is a rare but documented consequence of COVID infection.  3:03 AM Dr. Tonie Griffith to admit to hospitalist service.  He requests a D-dimer level and that we start heparin and remdesivir.  PROCEDURES  Procedures CRITICAL CARE Performed by: Karen Chafe Telesia Ates Total critical care time: 45 minutes Critical care time was exclusive of separately billable procedures and treating other patients. Critical care was necessary to treat or prevent imminent or life-threatening deterioration. Critical care was time spent personally by me on the following activities: development of treatment plan with patient and/or surrogate as well as nursing, discussions with consultants, evaluation of patient's response to treatment,  examination of patient, obtaining history from patient or surrogate, ordering and performing treatments and interventions, ordering and review of laboratory studies, ordering and review of radiographic studies, pulse oximetry and re-evaluation of patient's condition.   ED DIAGNOSES     ICD-10-CM   1. Hypothermia, initial encounter  T68.XXXA     2. New onset atrial fibrillation (HCC)  I48.91     3. COVID-19 virus infection   U07.1          Yolander Goodie, Jenny Reichmann, MD 12/22/21 [phone]

## 2021-12-22 NOTE — Assessment & Plan Note (Signed)
Started on remdesivir ?Supplemental oxygen provided keep O2 sat between 92 to 96% ?Albuterol MDI every 4 hours as needed for wheezing cough, shortness of breath ?Incentive spirometer every 2 hours while awake ?Supportive care provided. ?

## 2021-12-22 NOTE — Assessment & Plan Note (Signed)
Heart rate is stable.  ?Placed on heparin infusion.  ?Start low dose coreg ?

## 2021-12-22 NOTE — Progress Notes (Signed)
Pharmacy Brief Note - Anticoagulation Evening Follow Up: ? ?Patient is a 30 yoF on heparin drip for atrial fibrillation. For full history, see note by Suzzanne Cloud, PharmD from earlier today.  ? ?HDW = 75 kg ? ?Assessment: ?HL > 1.1 remains SUPRAtherapeutic despite decreasing rate of heparin infusion to 1050 units/hr ?Confirmed with RN that heparin infusing at correct rate. No signs of bleeding.  ?Heparin infusing via peripheral IV. Confirmed with RN that HL was obtained correctly via peripheral stick.  ? ?Goal: HL 0.3 - 0.7 ? ?Plan: ?HOLD heparin infusion x1 hour ?Decrease heparin infusion to 850 units/hr ?Check 8 hour HL ?CBC with AM labs tomorrow ?Monitor for signs of bleeding ? ?Lenis Noon, PharmD ?12/22/21 ?9:43 PM ?

## 2021-12-22 NOTE — Progress Notes (Addendum)
[facility] PROGRESS NOTE    Progress Note  Kara Mullins  KPV:[id number] DOB: April 17, 1951 DOA: 12/21/2021 PCP: [name], NP     Brief Narrative:   Kara Mullins is an 71 y.o. 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 Eventually from SNF was discharged home daughter, relates that the day prior to admission she had a low blood pressure and a variable heart rate she was concerned she might be getting septic again, brought  her in the emergency room was found to be hypothermic and bradycardic placed on a Bair hugger work-up revealed she was COVID-positive chest x-ray showed bilateral infiltratesd,was started on IV remdesivir and IV heparin infusion as she was found to be in A-fib with RVR    Assessment/Plan:   Sepsis ([place]) probably due to COVID-19 pneumonia: UA and chest x-ray showed b/l infiltrates. Daughter relates she tested positive at the facility on 11/27/2021 She was started on IV remdesivir and bear hugger, her temperature is coming up. We will have a low threshold to start antibiotics, procalcitonin is pending. MDI for wheezing, agree with current management continue conservative care. D-dimer was elevated, she is on IV heparin infusion for A-fib, CT scan cannot be performed due to renal dysfunction. Family agreed to meet with Palliative care meeting.  Thrombocytopenia: Likely due to sepsis, she is never had a colonoscopy, she is currently on IV heparin PT and INR normal.  Hypothermia: Likely due to sepsis Hypothermia is improving  Chronic atrial fibrillation: Heart rate is controlled started on low-dose Coreg and IV heparin infusion.  Chronic kidney disease stage V: Creatinine is currently close to baseline she ranges anywhere from  4.3-4.7. Not a candidate for dialysis Back in 05/07/2021 her creatinine was 1.3 upon discharge from the hospital her creatinine was up to 4.3 nephrology saw her during that time deemed her not a good candidate for hemodialysis for acute kidney injury was likely due to ATN but cannot rule out plasma cell dyscrasia.  Chronic combined systolic and diastolic heart failure: Currently on a beta-blocker, chest x-ray showed bilateral infiltrates, she is not fluid overloaded on physical exam discontinue IV Lasix. Last 2D echo showed an EF of 50% cardiology was concerned about cardiac amyloidosis as her work-up showed abnormal light chain she underwent bone marrow biopsy with hematology cardiac MRI on 04/30/2021 showed no evidence of cardiac amyloid  Abnormal thyroid function: 7.6 will have to be rechecked as an outpatient in 4 to 6 weeks probably sick euthyroid syndrome  Anemia of chronic renal disease: Hemoglobin appears to be at baseline continue to monitor intermittently.  Prolonged QTc: Keep the potassium greater than 4 magnesium greater than 2.  Thrombocytopenia: Likely due to sepsis physiology, PT and INR unremarkable, will continue to follow  Prolonged QT interval: Mildly prolonged corrected potassium greater than 4 magnesium greater than 2.    Ethics: Extremely poor prognosis.    DVT prophylaxis: heparin Family Communication:daughter Status is: Inpatient Remains inpatient appropriate because: Sepsis    Code Status:     Code Status Orders  (From admission, onward)           Start     Ordered   12/22/21 0514  Full code  Continuous        12/22/21 0513  Code Status History     Date Active Date Inactive Code Status Order ID Comments User Context   11/17/2021 1741 11/24/2021 0051 DNR [id number]  Barb Merino, MD Inpatient   11/09/2021 0734 11/17/2021 1741 Full Code [id number]  Reubin Milan, MD Inpatient   03/05/2021 0735 03/15/2021 1931 Full Code [id number]   Shalhoub, Sherryll Burger, MD ED         IV Access:   Peripheral IV   Procedures and diagnostic studies:   CT Head Wo Contrast  Result Date: 12/22/2021 CLINICAL DATA:  Hypothermia.  History of sepsis. EXAM: CT HEAD WITHOUT CONTRAST TECHNIQUE: Contiguous axial images were obtained from the base of the skull through the vertex without intravenous contrast. RADIATION DOSE REDUCTION: This exam was performed according to the departmental dose-optimization program which includes automated exposure control, adjustment of the mA and/or kV according to patient size and/or use of iterative reconstruction technique. COMPARISON:  CT 11/09/2021.  MRI 11/09/2021 FINDINGS: Brain: No evidence of acute infarction, hemorrhage, hydrocephalus, extra-axial collection or mass lesion/mass effect. Vascular: No hyperdense vessel or unexpected calcification. Skull: Normal. Negative for fracture or focal lesion. Sinuses/Orbits: Mucosal thickening in the paranasal sinuses. No acute air-fluid levels. Mastoid air cells are clear. Other: Congenital nonunion of the posterior arch of C1. IMPRESSION: No acute intracranial abnormalities.  No change since prior study. Electronically Signed   By: Lucienne Capers M.D.   On: 12/22/2021 02:46   DG Chest Portable 1 View  Result Date: 12/22/2021 CLINICAL DATA:  Sepsis EXAM: PORTABLE CHEST 1 VIEW COMPARISON:  11/13/2021 FINDINGS: Cardiac enlargement. Mild perihilar edema. Probable small pleural effusions. Atelectasis in the left base behind the heart. No pneumothorax. Calcification of the aorta. Appearances are similar to previous study, allowing for differences in technique. IMPRESSION: Cardiac enlargement with suggestion of mild pulmonary edema and small pleural effusions. Electronically Signed   By: Lucienne Capers M.D.   On: 12/22/2021 00:35     Medical Consultants:   None.   Subjective:    Kara Mullins relates she feels great she is never been short of breath.  Objective:     Vitals:   12/22/21 0600 12/22/21 0625 12/22/21 0630 12/22/21 0646  BP: (!) 145/93  (!) 140/106   Pulse: 78 82 71 76  Resp: 12 13 11 13   Temp: (!) 94.8 F (34.9 C) (!) 95 F (35 C) (!) 95.1 F (35.1 C) (!) 95.2 F (35.1 C)  TempSrc:      SpO2: 98% 100% 100% 100%   SpO2: 100 %   Intake/Output Summary (Last 24 hours) at 12/22/2021 6629 Last data filed at 12/22/2021 0439 Gross per 24 hour  Intake 600 ml  Output --  Net 600 ml   There were no vitals filed for this visit.  Exam: General exam: In no acute distress. Respiratory system: Good air movement and clear to auscultation. Cardiovascular system: S1 & S2 heard, RRR. No JVD. Gastrointestinal system: Abdomen is nondistended, soft and nontender.  Extremities: No pedal edema. Skin: No rashes, lesions or ulcers Psychiatry: Judgement and insight appear normal. Mood & affect appropriate.    Data Reviewed:    Labs: Basic Metabolic Panel: Recent Labs  Lab 12/19/21 1020 12/22/21 0010  NA 147* 140  K 4.1 3.8  CL 117* 116*  CO2 17* 18*  GLUCOSE 55* 76  BUN 57* 67*  CREATININE 4.35* 4.77*  CALCIUM 7.0* 6.6*  MG 3.0*  --    GFR Estimated Creatinine Clearance: 12 mL/min (A) (by C-G formula based  on SCr of 4.77 mg/dL (H)). Liver Function Tests: Recent Labs  Lab 12/19/21 1020 12/22/21 0010  AST 33 35  ALT 17 17  ALKPHOS 168* 138*  BILITOT 0.3 0.2*  PROT 5.1* 5.7*  ALBUMIN 2.2* 1.9*   No results for input(s): LIPASE, AMYLASE in the last 168 hours. No results for input(s): AMMONIA in the last 168 hours. Coagulation profile Recent Labs  Lab 12/22/21 0010  INR 1.1   COVID-19 Labs  Recent Labs    12/22/21 0010  DDIMER 2.75*    Lab Results  Component Value Date   SARSCOV2NAA POSITIVE (A) 12/22/2021   SARSCOV2NAA NEGATIVE 11/09/2021   [place] NEGATIVE 03/05/2021    CBC: Recent Labs  Lab 12/19/21 1020 12/22/21 0010  WBC 4.0 4.0  NEUTROABS  --  3.0  HGB 8.1* 8.0*  HCT 24.3* 25.2*  MCV 81  86.6  PLT 64* 57*   Cardiac Enzymes: No results for input(s): CKTOTAL, CKMB, CKMBINDEX, TROPONINI in the last 168 hours. BNP (last 3 results) No results for input(s): PROBNP in the last 8760 hours. CBG: Recent Labs  Lab 12/21/21 2359  GLUCAP 73   D-Dimer: Recent Labs    12/22/21 0010  DDIMER 2.75*   Hgb A1c: No results for input(s): HGBA1C in the last 72 hours. Lipid Profile: No results for input(s): CHOL, HDL, LDLCALC, TRIG, CHOLHDL, LDLDIRECT in the last 72 hours. Thyroid function studies: Recent Labs    12/22/21 0011  TSH 7.613*   Anemia work up: No results for input(s): VITAMINB12, FOLATE, FERRITIN, TIBC, IRON, RETICCTPCT in the last 72 hours. Sepsis Labs: Recent Labs  Lab 12/19/21 1020 12/22/21 0010 12/22/21 0120  WBC 4.0 4.0  --   LATICACIDVEN  --  0.4* 0.6   Microbiology Recent Results (from the past 240 hour(s))  Resp Panel by RT-PCR (Flu A&B, Covid)     Status: Abnormal   Collection Time: 12/22/21 12:11 AM   Specimen: Nasopharyngeal(NP) swabs in vial transport medium  Result Value Ref Range Status   SARS Coronavirus 2 by RT PCR POSITIVE (A) NEGATIVE Final    Comment: (NOTE) SARS-CoV-2 target nucleic acids are DETECTED.  The SARS-CoV-2 RNA is generally detectable in upper respiratory specimens during the acute phase of infection. Positive results are indicative of the presence of the identified virus, but do not rule out bacterial infection or co-infection with other pathogens not detected by the test. Clinical correlation with patient history and other diagnostic information is necessary to determine patient infection status. The expected result is Negative.  Fact Sheet for Patients: EntrepreneurPulse.com.au  Fact Sheet for Healthcare Providers: IncredibleEmployment.be  This test is not yet approved or cleared by the [place] FDA and  has been authorized for detection and/or diagnosis of SARS-CoV-2  by FDA under an Emergency Use Authorization (EUA).  This EUA will remain in effect (meaning this test can be used) for the duration of  the COVID-19 declaration under Section 564(b)(1) of the A ct, 21 U.S.C. section 360bbb-3(b)(1), unless the authorization is terminated or revoked sooner.     Influenza A by PCR NEGATIVE NEGATIVE Final   Influenza B by PCR NEGATIVE NEGATIVE Final    Comment: (NOTE) The Xpert Xpress SARS-CoV-2/FLU/RSV plus assay is intended as an aid in the diagnosis of influenza from Nasopharyngeal swab specimens and should not be used as a sole basis for treatment. Nasal washings and aspirates are unacceptable for Xpert Xpress SARS-CoV-2/FLU/RSV testing.  Fact Sheet for Patients: EntrepreneurPulse.com.au  Fact Sheet for Healthcare Providers: IncredibleEmployment.be  This test is not yet approved or cleared by the [place] and has been authorized for detection and/or diagnosis of SARS-CoV-2 by FDA under an Emergency Use Authorization (EUA). This EUA will remain in effect (meaning this test can be used) for the duration of the COVID-19 declaration under Section 564(b)(1) of the Act, 21 U.S.C. section 360bbb-3(b)(1), unless the authorization is terminated or revoked.  Performed at [facility], [place] [address], [place], [place] [address]      Medications:    carvedilol  3.125 mg Oral BID WC   folic acid  1 mg Oral Daily   furosemide  60 mg Intravenous Q12H   multivitamin with minerals  1 tablet Oral Daily   sodium chloride flush  3 mL Intravenous Q12H   Continuous Infusions:  sodium chloride     heparin 1,200 Units/hr (12/22/21 0343)   [START ON 12/23/2021] remdesivir 100 mg in NS 100 mL        LOS: 0 days   Charlynne Cousins  [facility]  12/22/2021, 6:49 AM

## 2021-12-22 NOTE — Assessment & Plan Note (Signed)
stable °

## 2021-12-22 NOTE — Assessment & Plan Note (Signed)
Stable. Monitor CBC. 

## 2021-12-22 NOTE — H&P (Signed)
?History and Physical  ? ? ?Patient: Kara Mullins DJS:[id number] DOB: 23-Oct-1950 ?DOA: 12/21/2021 ?DOS: the patient was seen and examined on 12/22/2021 ?PCP: [name], [name], NP  ?Patient coming from: Home ? ?Chief Complaint:  ?Chief Complaint  ?Patient presents with  ? Possible UTI  ? ?HPI: Kara Mullins is a 71 y.o. female with medical history significant of chronic systolic/diastolic CHF, HTN, CKD 5, HLD who had sepsis at end of January 2023 and was discharged on 11/23/21.  She initially went to nursing home [facility] for rehab.  Taken home 2 weeks ago per daughter.  Daughter states that yesterday Kara Mullins felt very cold to her and had low blood pressure and variable heart rate.  This concerned her that she may be getting septic again so she brought her in for evaluation.  Kara Mullins has no specific symptoms and denies cough, change in smell, change in taste, abdominal pain, nausea vomiting diarrhea, chest pain, shortness of breath.  She has been mentating normally per daughter and Kara Mullins.  In the emergency room she was found to have a rectal temperature of 91 degrees.  She was noted to be bradycardic with heart rate of 42 that was variable and will go up to the mid 60s.  EKG showed atrial fibrillation.  She has no history of atrial fibrillation.  She has chronic lower extremity edema which is at his baseline per patient and her daughter.  Patient was initially placed on sepsis protocol and given empiric antibiotics when she was found to be hypothermic.  She was placed on a Retail banker.  Work-up revealed she is positive for COVID.  No pneumonia on chest x-ray and urinalysis negative.  Patient was started on remdesivir in the emergency room.  She is placed on a heparin infusion in the ER with new onset atrial fibrillation. ? ?Review of Systems: As mentioned in the history of present illness. All other systems reviewed and are negative. ?Past Medical History:  ?Diagnosis Date  ? Chronic combined systolic  and diastolic congestive heart failure ([place]) 11/09/2021  ? Generalized anxiety disorder   ? Hyperlipidemia 11/09/2021  ? Hypertension   ? Stage 3b chronic kidney disease (CKD) ([place]) 11/09/2021  ? ?Past Surgical History:  ?Procedure Laterality Date  ? CATARACT EXTRACTION, BILATERAL    ? combined systolic and diastolic congestive heart failure     ? ?Social History:  reports that she has never smoked. She has never used smokeless tobacco. She reports that she does not currently use alcohol. She reports that she does not use drugs. ? ?Allergies  ?Allergen Reactions  ? Chlorhexidine   ? ? ?Family History  ?Problem Relation Age of Onset  ? Throat cancer Father   ? ? ?Prior to Admission medications   ?Medication Sig Start Date End Date Taking? Authorizing Provider  ?carvedilol (COREG) 3.125 MG tablet Take 1 tablet (3.125 mg total) by mouth 2 (two) times daily with a meal. 12/19/21 03/19/22  Donato Heinz, MD  ?furosemide (LASIX) 40 MG tablet Take furosemide 40 mg daily 5 days a week-DO NOT take on Tuesdays or Fridays. 12/21/21   Donato Heinz, MD  ?Multiple Vitamin (MULTIVITAMIN WITH MINERALS) TABS tablet Take 1 tablet by mouth daily. 11/24/21   Pokhrel, Corrie Mckusick, MD  ?sodium bicarbonate 650 MG tablet Take 1 tablet (650 mg total) by mouth 3 (three) times daily. 11/23/21   Pokhrel, Corrie Mckusick, MD  ? ? ?Physical Exam: ?Vitals:  ? 12/22/21 0330 12/22/21 0352 12/22/21 0400 12/22/21 0425  ?  BP: (!) 136/101  131/89   ?Pulse: 61 69 75 67  ?Resp: 12 14 11 14   ?Temp: (!) 92.9 ?F (33.8 ?C) (!) 93.2 ?F (34 ?C) (!) 93.3 ?F (34.1 ?C) (!) 93.6 ?F (34.2 ?C)  ?TempSrc:   Rectal   ?SpO2: 100% 100% 100% 100%  ? ?General: WDWN, Alert and oriented x3.  ?Eyes: EOMI, PERRL, conjunctivae normal.  Sclera nonicteric ?HENT:  [place]/AT, external ears normal.  Nares patent without epistasis.  Mucous membranes are moist. ?Neck: Soft, normal range of motion, supple, no masses, no thyromegaly.  Trachea midline ?Respiratory: clear to auscultation  bilaterally, no wheezing, no crackles. Normal respiratory effort. No accessory muscle use.  ?Cardiovascular: Irregularly irregular rhythm, normal rate. no murmurs / rubs / gallops. +2 lower extremity edema ?Abdomen: Soft, no tenderness, nondistended, no rebound or guarding.  No masses palpated. Bowel sounds normoactive ?Musculoskeletal: FROM. no cyanosis. Normal muscle tone.  ?Skin: Warm, dry, intact no rashes.  No petechiae or purpura. ?Neurologic: CN 2-12 grossly intact.  Normal speech.  Sensation intact, ?Psychiatric: Normal judgment and insight.  Normal mood.  ? ?Data Reviewed: ?Lab Work: WBC 4000 hemoglobin 8.0 hematocrit 25.2 platelets 57,000 sodium 140 potassium 3.8 chloride 116 bicarb 18 creatinine 4.77 which is baseline BUN 67 glucose 76 calcium 6.6 albumin 1.9 corrected calcium 8.3 alkaline phosphatase 138 AST 35 ALT 17 bilirubin 0.2 lactic acid 0.4 initially repeat lactic acid 0.6.  D-dimer 2.75 INR 1.1 urinalysis hazy yellow large protein negative leukocytes negative nitrites.  COVID positive.  Influenza A and B negative ? ?Assessment and Plan: ?* Sepsis ([place]) ?Kara Mullins with sepsis due to Covid. Meets sepsis criteria with hypothermia, bradycardia with fluctuating heart rate, tachypnea, new onset a-fib. UA is negative and no pneumonia on CXR.  ?Given IVF in ER. ? ?COVID-19 virus infection ?Started on remdesivir ?Supplemental oxygen provided keep O2 sat between 92 to 96% ?Albuterol MDI every 4 hours as needed for wheezing cough, shortness of breath ?Incentive spirometer every 2 hours while awake ?Supportive care provided. ?D-dimer elevated but pt cannot have CTA chest with renal function and is on heparin infusion for a-fib ? ?Hypothermia ?Bear Hugger applied. Monitor temperature.  ?Literature states that Covid has been documented to cause hypothermia. ? ?Atrial fibrillation ([place]) ?Heart rate is stable.  ?Placed on heparin infusion.  ?Start low dose coreg ? ?CKD (chronic kidney disease) stage 5, GFR  less than 15 ml/min (HCC) ?stable ? ?Chronic combined systolic and diastolic congestive heart failure ([place]) ?Stable. Mild pulmonary edema on CXR. Continue diuresis. Monitor I&O and daily weight.  ? ?Anemia of chronic kidney failure, stage 5 (HCC) ?Stable. Monitor CBC  ? ?Prolonged QT interval ?Monitor on telemetry.  ?Avoid medications that can further prolong QT interval if possible ? ?Thrombocytopenia ?Monitor platelet count with CBC in am ? ? ?Code Status:  Full Code. Placed on heparin infusion in the ER with new onset a-fib.  ? ?Family Communication: Diagnosis and plan discussed with patient and her daughter who is at bedside.  They verbalized understanding agree with plan.  Further recommendations to follow as clinically indicated ? ?Author: ?Eben Burow, MD ?12/22/2021 5:02 AM ? ?For on call review [email or web address].  ?

## 2021-12-22 NOTE — Assessment & Plan Note (Signed)
Stable. Mild pulmonary edema on CXR. Continue diuresis. Monitor I&O and daily weight.  ?

## 2021-12-22 NOTE — ED Notes (Signed)
Bear Hugger warming blanket verbal order by Dr. Florina Ou to be applied. Patients rectal temperature 41F. ?

## 2021-12-22 NOTE — Assessment & Plan Note (Signed)
Ms. Scobee with sepsis due to Covid. Meets sepsis criteria with hypothermia, bradycardia with fluctuating heart rate, tachypnea, new onset a-fib. UA is negative and no pneumonia on CXR.  ?Given IVF in ER. ?

## 2021-12-22 NOTE — Assessment & Plan Note (Signed)
Bear Hugger applied. Monitor temperature.  ?Literature states that Covid has been documented to cause hypothermia. ?

## 2021-12-23 DIAGNOSIS — N185 Chronic kidney disease, stage 5: Secondary | ICD-10-CM | POA: Diagnosis not present

## 2021-12-23 DIAGNOSIS — I4821 Permanent atrial fibrillation: Secondary | ICD-10-CM | POA: Diagnosis not present

## 2021-12-23 DIAGNOSIS — I5042 Chronic combined systolic (congestive) and diastolic (congestive) heart failure: Secondary | ICD-10-CM | POA: Diagnosis not present

## 2021-12-23 DIAGNOSIS — T68XXXA Hypothermia, initial encounter: Secondary | ICD-10-CM | POA: Diagnosis not present

## 2021-12-23 DIAGNOSIS — A419 Sepsis, unspecified organism: Secondary | ICD-10-CM | POA: Diagnosis not present

## 2021-12-23 DIAGNOSIS — Z7189 Other specified counseling: Secondary | ICD-10-CM | POA: Diagnosis not present

## 2021-12-23 DIAGNOSIS — Z515 Encounter for palliative care: Secondary | ICD-10-CM | POA: Diagnosis not present

## 2021-12-23 LAB — URINE CULTURE: Culture: NO GROWTH

## 2021-12-23 LAB — COMPREHENSIVE METABOLIC PANEL
ALT: 15 U/L (ref 0–44)
AST: 29 U/L (ref 15–41)
Albumin: 1.6 g/dL — ABNORMAL LOW (ref 3.5–5.0)
Alkaline Phosphatase: 108 U/L (ref 38–126)
Anion gap: 8 (ref 5–15)
BUN: 76 mg/dL — ABNORMAL HIGH (ref 8–23)
CO2: 15 mmol/L — ABNORMAL LOW (ref 22–32)
Calcium: 6.5 mg/dL — ABNORMAL LOW (ref 8.9–10.3)
Chloride: 117 mmol/L — ABNORMAL HIGH (ref 98–111)
Creatinine, Ser: 5.09 mg/dL — ABNORMAL HIGH (ref 0.44–1.00)
GFR, Estimated: 9 mL/min — ABNORMAL LOW (ref >60)
Glucose, Bld: 92 mg/dL (ref 70–99)
Potassium: 4 mmol/L (ref 3.5–5.1)
Sodium: 140 mmol/L (ref 135–145)
Total Bilirubin: 0.3 mg/dL (ref 0.3–1.2)
Total Protein: 4.8 g/dL — ABNORMAL LOW (ref 6.5–8.1)

## 2021-12-23 LAB — CBC WITH DIFFERENTIAL/PLATELET
Abs Immature Granulocytes: 0.02 10*3/uL (ref 0.00–0.07)
Basophils Absolute: 0 10*3/uL (ref 0.0–0.1)
Basophils Relative: 0 %
Eosinophils Absolute: 0 10*3/uL (ref 0.0–0.5)
Eosinophils Relative: 0 %
HCT: 20.8 % — ABNORMAL LOW (ref 36.0–46.0)
Hemoglobin: 6.6 g/dL — CL (ref 12.0–15.0)
Immature Granulocytes: 0 %
Lymphocytes Relative: 18 %
Lymphs Abs: 0.8 10*3/uL (ref 0.7–4.0)
MCH: 27.4 pg (ref 26.0–34.0)
MCHC: 31.7 g/dL (ref 30.0–36.0)
MCV: 86.3 fL (ref 80.0–100.0)
Monocytes Absolute: 0.3 10*3/uL (ref 0.1–1.0)
Monocytes Relative: 7 %
Neutro Abs: 3.6 10*3/uL (ref 1.7–7.7)
Neutrophils Relative %: 75 %
Platelets: 58 10*3/uL — ABNORMAL LOW (ref 150–400)
RBC: 2.41 MIL/uL — ABNORMAL LOW (ref 3.87–5.11)
RDW: 18.3 % — ABNORMAL HIGH (ref 11.5–15.5)
Smear Review: DECREASED
WBC: 4.8 10*3/uL (ref 4.0–10.5)
nRBC: 1.5 % — ABNORMAL HIGH (ref 0.0–0.2)

## 2021-12-23 LAB — PREPARE RBC (CROSSMATCH)

## 2021-12-23 LAB — MRSA NEXT GEN BY PCR, NASAL: MRSA by PCR Next Gen: DETECTED — AB

## 2021-12-23 LAB — C-REACTIVE PROTEIN: CRP: 0.5 mg/dL (ref <1.0)

## 2021-12-23 LAB — HEPARIN LEVEL (UNFRACTIONATED)
Heparin Unfractionated: 0.61 IU/mL (ref 0.30–0.70)
Heparin Unfractionated: 0.53 IU/mL (ref 0.30–0.70)

## 2021-12-23 LAB — PROCALCITONIN: Procalcitonin: <0.1 ng/mL

## 2021-12-23 LAB — D-DIMER, QUANTITATIVE: D-Dimer, Quant: 2 ug/mL-FEU — ABNORMAL HIGH (ref 0.00–0.50)

## 2021-12-23 MED ORDER — SODIUM CHLORIDE 0.9% IV SOLUTION: Freq: Once | INTRAVENOUS | Status: DC

## 2021-12-23 MED ORDER — SODIUM CHLORIDE 0.9% IV SOLUTION: Freq: Once | INTRAVENOUS | Status: AC

## 2021-12-23 MED ORDER — SODIUM CHLORIDE 0.9 % IV SOLN
INTRAVENOUS | Status: AC
Start: 2021-12-23 — End: 2021-12-24

## 2021-12-23 NOTE — Telephone Encounter (Signed)
Would hold if SBP <100 ?

## 2021-12-23 NOTE — Progress Notes (Signed)
Pt stable at time of transfer. Family at bedside notified of transfer. Belongings given to daughter. 85W RN met with [place] in pt's new room.   ?

## 2021-12-23 NOTE — Telephone Encounter (Signed)
Recommend being seen by PCP or urgent care for possible UTI ?

## 2021-12-23 NOTE — Progress Notes (Signed)
ANTICOAGULATION CONSULT NOTE ? ?Pharmacy Consult for heparin ?Indication: atrial fibrillation ? ?Allergies  ?Allergen Reactions  ? Chlorhexidine Other (See Comments)  ?  Unknown reaction  ? ? ?Patient Measurements: ?Height: 5\' 5"  (165.1 cm) ?Weight: 93.7 kg (206 lb 9.1 oz) ?IBW/kg (Calculated) : 57 ?Heparin Dosing Weight: 85.3kg ? ?Vital Signs: ?Temp: 97.6 ?F (36.4 ?C) (03/12 1453) ?Temp Source: Oral (03/12 1453) ?BP: 169/79 (03/12 1453) ?Pulse Rate: 59 (03/12 1453) ? ?Labs: ?Recent Labs  ?  12/22/21 ?0010 12/22/21 ?1141 12/22/21 ?2034 12/23/21 ?0151 12/23/21 ?3716 12/23/21 ?1424  ?HGB 8.0*  --   --  6.6*  --   --   ?HCT 25.2*  --   --  20.8*  --   --   ?PLT 57*  --   --  58*  --   --   ?LABPROT 13.9  --   --   --   --   --   ?INR 1.1  --   --   --   --   --   ?HEPARINUNFRC  --    < > >1.10*  --  0.61 0.53  ?CREATININE 4.77*  --   --  5.09*  --   --   ? < > = values in this interval not displayed.  ? ? ? ?Estimated Creatinine Clearance: 11.8 mL/min (A) (by C-G formula based on SCr of 5.09 mg/dL (H)). ? ? ?Medical History: ?Past Medical History:  ?Diagnosis Date  ? Chronic combined systolic and diastolic congestive heart failure ([place]) 11/09/2021  ? Generalized anxiety disorder   ? Hyperlipidemia 11/09/2021  ? Hypertension   ? Stage 3b chronic kidney disease (CKD) ([place]) 11/09/2021  ? ? ?Assessment: ?71 yo pt with new onset afib, pharmacy consulted to dose heparin.  History of CKD stage V and anemia of chronic disease.  No prior AC noted.  Thrombocytopenia noted to be likely due to sepsis. ? ?Today, 12/23/21 ?Heparin level therapeutic at 0.53 with heparin infusing at 850 units/hour ?Hgb down 6.6, plt low at 58 (plt 64 on 3/8), Scr 5.09 ?No bleeding reported by RN and no issues with infusion ? ? ?Goal of Therapy:  ?Heparin level 0.3-0.7 units/ml ?Monitor platelets by anticoagulation protocol: Yes ?  ?Plan:  ?Continue heparin at 850 units/hr ?Monitor daily heparin level, CBC, signs/symptoms of bleeding ? ?Dimple Nanas, PharmD ?12/23/2021 3:02 PM ? ? ?

## 2021-12-23 NOTE — Consult Note (Signed)
Consultation Note Date: 12/23/2021   Patient Name: Kara Mullins  DOB: Feb 22, 1951  MRN: [id number]  Age / Sex: 71 y.o., female  PCP: [name], [name], NP Referring Physician: Aileen Fass, Tammi Klippel, MD  Reason for Consultation: Establishing goals of care  HPI/Patient Profile: 70 y.o. 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  She was brought to ED after her daughter worried she was getting septic again at home and was found to be COVID-positive with bilateral infiltrates on x-ray.  Started on remdesivir for COVID and heparin as she was also found to be in A-fib with RVR.  Palliative consulted for goals of care.  Clinical Assessment and Goals of Care: I met today with Kara Mullins and her daughter, [name].  They remember me from prior encounter earlier this year.  On my arrival, Kara Mullins was sitting in bed in no distress.  She was awake and alert.  Her daughter reports that they have had an emotional day today and [name] is worried that people are "hiding something" from her in regard to her condition.  We reviewed her clinical course since discharge from the hospital with transition to rehab and eventually home with her daughter.  Discussed continued concerns regarding her nutrition and functional status.  Gently discussed that she has multiple chronic comorbidities that are not going to go away as well as acute issue that brought her to the hospital with sepsis secondary to [place].  At the same time, discussed that we are still treating any potentially treatable problems and will need to see how her body responds to these interventions.  SUMMARY OF RECOMMENDATIONS   -Full code/full scope: [name] is not emotionally  in place where she is able to engage in this discussion this evening. -Attempted support and continued rapport building.  I know her and her daughter from previous encounter and discussed need for continued conversation based on the clinical course in the next day or 2. They are agreeable for follow-up tomorrow.  Prognosis:  Guarded  Discharge Planning: To Be Determined      Primary Diagnoses: Present on Admission:  Sepsis ([place])  Hypothermia  Chronic combined systolic and diastolic congestive heart failure ([place])   I have reviewed the medical record, interviewed the patient and family, and examined the patient. The following aspects are pertinent.  Past Medical History:  Diagnosis Date   Chronic combined systolic and diastolic congestive heart failure (HCC) 11/09/2021   Generalized anxiety disorder    Hyperlipidemia 11/09/2021   Hypertension    Stage 3b chronic kidney disease (CKD) ([place]) 11/09/2021   Social History   Socioeconomic History   Marital status: Divorced    Spouse name: Not on file   Number of children: Not on file   Years of education: Not on file   Highest education level: Not on file  Occupational History   Not on file  Tobacco Use   Smoking status: Never   Smokeless tobacco: Never  Vaping Use   Vaping Use: Never used  Substance and Sexual Activity   Alcohol use: Not Currently   Drug use: Never   Sexual activity: Not on file  Other Topics Concern   Not on file  Social History Narrative   Tobacco use, amount per day now: N/A   Past tobacco use, amount per day: N/A   How many years did you use tobacco: N/A   Alcohol use (drinks per week): N/A   Diet:   Do you drink/eat things with caffeine: Coffee   Marital status: Divorced                                 What year were you married? 1992   Do you live in a house, apartment, assisted living, condo, trailer, etc.?   Is it one or more stories?   How many persons live in your home? 2   Do you have  pets in your home?( please list) No   Highest Level of education completed? High School   Current or past profession: Restaurant Employee   Do you exercise?   Yes                               Type and how often? Walk   Do you have a living will? No   Do you have a DNR form?    No                               If not, do you want to discuss one?   Do you have signed POA/HPOA forms? No                       If so, please bring to you appointment      Do you have any difficulty bathing or dressing yourself? No   Do you have any difficulty preparing food or eating? No   Do you have any difficulty managing your medications? No   Do you have any difficulty managing your finances? No   Do you have any difficulty affording your medications? No   Social Determinants of Radio broadcast assistant Strain: Not on file  Food Insecurity: Not on file  Transportation Needs: Not on file  Physical Activity: Not on file  Stress: Not on file  Social Connections: Not on file   Family History  Problem Relation Age of Onset   Throat cancer Father    Scheduled Meds:  sodium chloride   Intravenous Once   sodium chloride   Intravenous Once   carvedilol  3.125 mg Oral BID WC   folic acid  1 mg Oral Daily   mouth rinse  15 mL Mouth Rinse BID   multivitamin with minerals  1 tablet Oral Daily   sodium chloride flush  3 mL Intravenous Q12H   Continuous Infusions:  sodium chloride     sodium chloride 75 mL/hr at 12/23/21 0952   heparin 850 Units/hr (12/22/21 2301)   remdesivir 100 mg in NS 100 mL     PRN Meds:.sodium chloride, acetaminophen, guaiFENesin-dextromethorphan, senna-docusate, sodium chloride flush Medications Prior to Admission:  Prior to Admission medications   Medication Sig Start Date End Date  Taking? Authorizing Provider  Ascorbic Acid (VITAMIN C PO) Take 1 tablet by mouth daily after supper. Crush and mix with applesauce   Yes [provider]  carvedilol (COREG) 3.125 MG  tablet Take 1 tablet (3.125 mg total) by mouth 2 (two) times daily with a meal. 12/19/21 03/19/22 Yes Donato Heinz, MD  furosemide (LASIX) 40 MG tablet Take furosemide 40 mg daily 5 days a week-DO NOT take on Tuesdays or Fridays. Patient taking differently: Take 40 mg by mouth See admin instructions. Take one tablet (40 mg)  by mouth daily 5 days a week-DO NOT take on Tuesdays or Fridays. 12/21/21  Yes Donato Heinz, MD  Multiple Vitamin (MULTIVITAMIN WITH MINERALS) TABS tablet Take 1 tablet by mouth daily. Patient taking differently: Take 1 tablet by mouth at bedtime. 11/24/21  Yes Pokhrel, Laxman, MD  Multiple Vitamins-Minerals (ZINC PO) Take 1 tablet by mouth daily after supper. Crush and mix with applesauce   Yes [provider]  sodium bicarbonate 650 MG tablet Take 1 tablet (650 mg total) by mouth 3 (three) times daily. 11/23/21  Yes Pokhrel, Corrie Mckusick, MD   Allergies  Allergen Reactions   Chlorhexidine Other (See Comments)    Unknown reaction   Review of Systems Currently denies all complaints  Physical Exam General: Alert, awake, in no acute distress.   HEENT: No bruits, no goiter, no JVD Heart: Regular rate and rhythm. No murmur appreciated. Lungs: Good air movement, clear Abdomen: Soft, nontender, nondistended, positive bowel sounds.   Ext: No significant edema Skin: Warm and dry Neuro: Grossly intact, nonfocal.   Vital Signs: BP 136/68    Pulse 62    Temp (!) 97 F (36.1 C)    Resp 14    SpO2 100%  Pain Scale: 0-10   Pain Score: 0-No pain   SpO2: SpO2: 100 % O2 Device:SpO2: 100 % O2 Flow Rate: .   IO: Intake/output summary:  Intake/Output Summary (Last 24 hours) at 12/23/2021 1014 Last data filed at 12/23/2021 1115 Gross per 24 hour  Intake 176.48 ml  Output 300 ml  Net -123.52 ml    LBM: Last BM Date :  (PTA) Baseline Weight:   Most recent weight:       Palliative Assessment/Data:   Flowsheet Rows    Flowsheet Row Most Recent Value   Intake Tab   Referral Department Hospitalist  Unit at Time of Referral ICU  Palliative Care Primary Diagnosis Sepsis/Infectious Disease  Date Notified 12/22/21  Palliative Care Type Return patient Palliative Care  Reason for referral Clarify Goals of Care  Date of Admission 12/21/21  Date first seen by Palliative Care 12/22/21  # of days Palliative referral response time 0 Day(s)  # of days IP prior to Palliative referral 1  Clinical Assessment   Palliative Performance Scale Score 50%  Psychosocial & Spiritual Assessment   Palliative Care Outcomes   Patient/Family meeting held? Yes  Who was at the meeting? Patient, daughter  Palliative Care Outcomes Clarified goals of care       Time In: 1800 Time Out: 1845 Time Total: 45 Greater than 50%  of this time was spent counseling and coordinating care related to the above assessment and plan.  Signed by: Micheline Rough, MD   Please contact Palliative Medicine Team phone at [phone] for questions and concerns.  For individual provider: See Shea Evans

## 2021-12-23 NOTE — Progress Notes (Signed)
ANTICOAGULATION CONSULT NOTE ? ?Pharmacy Consult for heparin ?Indication: atrial fibrillation ? ?Allergies  ?Allergen Reactions  ? Chlorhexidine Other (See Comments)  ?  Unknown reaction  ? ? ?Patient Measurements: ?  ?Heparin Dosing Weight: 85.3kg ? ?Vital Signs: ?Temp: 96.4 ?F (35.8 ?C) (03/11 2200) ?BP: 116/64 (03/11 2200) ?Pulse Rate: 69 (03/11 2200) ? ?Labs: ?Recent Labs  ?  12/22/21 ?0010 12/22/21 ?1141 12/22/21 ?2034 12/23/21 ?0151 12/23/21 ?5809  ?HGB 8.0*  --   --  6.6*  --   ?HCT 25.2*  --   --  20.8*  --   ?PLT 57*  --   --  58*  --   ?LABPROT 13.9  --   --   --   --   ?INR 1.1  --   --   --   --   ?HEPARINUNFRC  --  0.96* >1.10*  --  0.61  ?CREATININE 4.77*  --   --  5.09*  --   ? ? ? ?Estimated Creatinine Clearance: 11.2 mL/min (A) (by C-G formula based on SCr of 5.09 mg/dL (H)). ? ? ?Medical History: ?Past Medical History:  ?Diagnosis Date  ? Chronic combined systolic and diastolic congestive heart failure ([place]) 11/09/2021  ? Generalized anxiety disorder   ? Hyperlipidemia 11/09/2021  ? Hypertension   ? Stage 3b chronic kidney disease (CKD) ([place]) 11/09/2021  ? ? ? ?Assessment: ?71 yo pt with new onset afib, pharmacy consulted to dose heparin.  History of CKD stage V and anemia of chronic disease.  No prior AC noted.  Thrombocytopenia noted to be likely due to sepsis. ? ?Today, 12/23/21 ?06:34 Heparin level therapeutic at 0.61 with heparin infusing at 850 units/hour ?Hgb down 6.6, plt low at 58 (plt 64 on 3/8), Scr 5.09 ? ? ?Goal of Therapy:  ?Heparin level 0.3-0.7 units/ml ?Monitor platelets by anticoagulation protocol: Yes ?  ?Plan:  ?Continue heparin at 850 units/hr ?Confirmatory Heparin level in 8 hours ?Monitor daily heparin level, CBC, signs/symptoms of bleeding ? ? ?Royetta Asal, PharmD, BCPS ?Clinical Pharmacist ?[place] ?Please utilize Amion for appropriate phone number to reach the unit pharmacist ([place]) ?12/23/2021 8:15 AM ? ? ? ? ?

## 2021-12-23 NOTE — Progress Notes (Addendum)
Assumed care of patient from [facility].  Hep gtt running at 8.42mL/h in left arm, second unit of PRBC infusing in right hand. Pt was mildly hypothermic after first unit of blood with rectal temp of 97.4 ?Rectal temp was just taken by Betsy Coder, was 95.2@ 1900 ?This RN rechecked rectal temp @ 2022 was 95.4, temp coming up, will continue to monitor.  RN adjusted the room temp from 75 to about 78 ?2102 blood infusion complete, temp orally 97.1 ?2215 temp orally 97.4.  Will continue to monitor ?2353 temp orally 97.4. Will continue to monitor ? ? ? ?

## 2021-12-23 NOTE — Progress Notes (Signed)
[facility] PROGRESS NOTE    Progress Note  Kara Mullins  UJW:[id number] DOB: 02-08-1951 DOA: 12/21/2021 PCP: [name], NP     Brief Narrative:   Kara Mullins is an 71 y.o. 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 Eventually from SNF was discharged home daughter, relates that the day prior to admission she had a low blood pressure and a variable heart rate she was concerned she might be getting septic again, brought  her in the emergency room was found to be hypothermic and bradycardic placed on a Bair hugger work-up revealed she was COVID-positive chest x-ray showed bilateral infiltratesd,was started on IV remdesivir and IV heparin infusion as she was found to be in A-fib with RVR    Assessment/Plan:   Sepsis ([place]) probably due to COVID-19 pneumonia: UA and chest x-ray showed b/l infiltrates. Daughter relates she tested positive at the facility on 11/27/2021. Continue IV remdesivir she is satting 100% on room air. Procalcitonin was low yield. Continue inhalers. She is currently on IV heparin for A-fib. Renal function is failing, family to discuss with hospice comfort measures. Transfer to telemetry.  Thrombocytopenia: Likely due to sepsis, platelets have stabilized continue to monitor closely.  Hypothermia: Likely due to sepsis Resolved.  Chronic atrial fibrillation: Continue Coreg and IV heparin.  Chronic kidney disease stage V: Creatinine is currently close to baseline she ranges anywhere from 4.3-4.7. Not a candidate for dialysis Deemed not a good candidate for hemodialysis for acute kidney injury on the last admission. Renal function is deteriorating. Started on gentle IV fluids. Family  to meet with palliative care  Chronic  combined systolic and diastolic heart failure: Last 2D echo showed an EF of 50% cardiology was concerned about cardiac amyloidosis as her work-up showed abnormal light chain she underwent bone marrow biopsy with hematology cardiac MRI on 04/30/2021 showed no evidence of cardiac amyloid We will be judicious with IV fluids.  Abnormal thyroid function: Will need to be recheck in 4 to 6 weeks, probably sick euthyroid syndrome  Anemia of chronic renal disease: Hemoglobin appears to be at baseline continue to monitor intermittently.  Prolonged QTc: Keep the potassium greater than 4 magnesium greater than 2.  Thrombocytopenia: Likely due to sepsis physiology, PT and INR unremarkable, will continue to follow  Prolonged QT interval: Mildly prolonged corrected potassium greater than 4 magnesium greater than 2.    Ethics: Extremely poor prognosis.    DVT prophylaxis: heparin Family Communication:daughter Status is: Inpatient Remains inpatient appropriate because: Sepsis    Code Status:     Code Status Orders  (From admission, onward)           Start     Ordered   12/22/21 0514  Full code  Continuous        12/22/21 0513           Code Status History     Date Active Date Inactive Code Status Order ID Comments User Context   11/17/2021 1741 11/24/2021 0051 DNR [id number]  Barb Merino, MD Inpatient   11/09/2021 0734 11/17/2021 1741 Full Code [id number]  Reubin Milan, MD Inpatient   03/05/2021 0735 03/15/2021 1931 Full Code [id number]  Shalhoub, Sherryll Burger, MD ED         IV Access:   Peripheral IV   Procedures  and diagnostic studies:   CT Head Wo Contrast  Result Date: 12/22/2021 CLINICAL DATA:  Hypothermia.  History of sepsis. EXAM: CT HEAD WITHOUT CONTRAST TECHNIQUE: Contiguous axial images were obtained from the base of the skull through the vertex without intravenous contrast. RADIATION DOSE REDUCTION: This exam was performed according to the departmental  dose-optimization program which includes automated exposure control, adjustment of the mA and/or kV according to patient size and/or use of iterative reconstruction technique. COMPARISON:  CT 11/09/2021.  MRI 11/09/2021 FINDINGS: Brain: No evidence of acute infarction, hemorrhage, hydrocephalus, extra-axial collection or mass lesion/mass effect. Vascular: No hyperdense vessel or unexpected calcification. Skull: Normal. Negative for fracture or focal lesion. Sinuses/Orbits: Mucosal thickening in the paranasal sinuses. No acute air-fluid levels. Mastoid air cells are clear. Other: Congenital nonunion of the posterior arch of C1. IMPRESSION: No acute intracranial abnormalities.  No change since prior study. Electronically Signed   By: Lucienne Capers M.D.   On: 12/22/2021 02:46   DG Chest Portable 1 View  Result Date: 12/22/2021 CLINICAL DATA:  Sepsis EXAM: PORTABLE CHEST 1 VIEW COMPARISON:  11/13/2021 FINDINGS: Cardiac enlargement. Mild perihilar edema. Probable small pleural effusions. Atelectasis in the left base behind the heart. No pneumothorax. Calcification of the aorta. Appearances are similar to previous study, allowing for differences in technique. IMPRESSION: Cardiac enlargement with suggestion of mild pulmonary edema and small pleural effusions. Electronically Signed   By: Lucienne Capers M.D.   On: 12/22/2021 00:35     Medical Consultants:   None.   Subjective:    [name] has no new complaints.  Objective:    Vitals:   12/22/21 1900 12/22/21 2000 12/22/21 2100 12/22/21 2200  BP: (!) 141/116 (!) 146/84 (!) 155/87 116/64  Pulse: 72 68 66 69  Resp: 13 12 10 12   Temp: (!) 96.6 F (35.9 C) (!) 96.4 F (35.8 C) (!) 96.4 F (35.8 C) (!) 96.4 F (35.8 C)  TempSrc:      SpO2: 100% 97% 100% 100%   SpO2: 100 %   Intake/Output Summary (Last 24 hours) at 12/23/2021 0710 Last data filed at 12/22/2021 1737 Gross per 24 hour  Intake 122.85 ml  Output 300 ml  Net -177.15 ml     There were no vitals filed for this visit.  Exam: General exam: In no acute distress. Respiratory system: Good air movement and clear to auscultation. Cardiovascular system: S1 & S2 heard, RRR. No JVD. Gastrointestinal system: Abdomen is nondistended, soft and nontender.  Extremities: No pedal edema. Skin: No rashes, lesions or ulcers Psychiatry: Judgement and insight appear normal. Mood & affect appropriate.   Data Reviewed:    Labs: Basic Metabolic Panel: Recent Labs  Lab 12/19/21 1020 12/22/21 0010 12/23/21 0151  NA 147* 140 140  K 4.1 3.8 4.0  CL 117* 116* 117*  CO2 17* 18* 15*  GLUCOSE 55* 76 92  BUN 57* 67* 76*  CREATININE 4.35* 4.77* 5.09*  CALCIUM 7.0* 6.6* 6.5*  MG 3.0*  --   --     GFR Estimated Creatinine Clearance: 11.2 mL/min (A) (by C-G formula based on SCr of 5.09 mg/dL (H)). Liver Function Tests: Recent Labs  Lab 12/19/21 1020 12/22/21 0010 12/23/21 0151  AST 33 35 29  ALT 17 17 15   ALKPHOS 168* 138* 108  BILITOT 0.3 0.2* 0.3  PROT 5.1* 5.7* 4.8*  ALBUMIN 2.2* 1.9* 1.6*    No results for input(s): LIPASE, AMYLASE in the last 168 hours. No results for input(s): AMMONIA in  the last 168 hours. Coagulation profile Recent Labs  Lab 12/22/21 0010  INR 1.1    COVID-19 Labs  Recent Labs    12/22/21 0010 12/23/21 0151  DDIMER 2.75* 2.00*     Lab Results  Component Value Date   SARSCOV2NAA POSITIVE (A) 12/22/2021   [place] NEGATIVE 11/09/2021   [place] NEGATIVE 03/05/2021    CBC: Recent Labs  Lab 12/19/21 1020 12/22/21 0010 12/23/21 0151  WBC 4.0 4.0 4.8  NEUTROABS  --  3.0 3.6  HGB 8.1* 8.0* 6.6*  HCT 24.3* 25.2* 20.8*  MCV 81 86.6 86.3  PLT 64* 57* 58*    Cardiac Enzymes: No results for input(s): CKTOTAL, CKMB, CKMBINDEX, TROPONINI in the last 168 hours. BNP (last 3 results) No results for input(s): PROBNP in the last 8760 hours. CBG: Recent Labs  Lab 12/21/21 2359  GLUCAP 73    D-Dimer: Recent  Labs    12/22/21 0010 12/23/21 0151  DDIMER 2.75* 2.00*    Hgb A1c: No results for input(s): HGBA1C in the last 72 hours. Lipid Profile: No results for input(s): CHOL, HDL, LDLCALC, TRIG, CHOLHDL, LDLDIRECT in the last 72 hours. Thyroid function studies: Recent Labs    12/22/21 0011  TSH 7.613*    Anemia work up: No results for input(s): VITAMINB12, FOLATE, FERRITIN, TIBC, IRON, RETICCTPCT in the last 72 hours. Sepsis Labs: Recent Labs  Lab 12/19/21 1020 12/22/21 0010 12/22/21 0120 12/22/21 0515 12/23/21 0151  PROCALCITON  --   --   --  <0.10 <0.10  WBC 4.0 4.0  --   --  4.8  LATICACIDVEN  --  0.4* 0.6  --   --     Microbiology Recent Results (from the past 240 hour(s))  Resp Panel by RT-PCR (Flu A&B, Covid)     Status: Abnormal   Collection Time: 12/22/21 12:11 AM   Specimen: Nasopharyngeal(NP) swabs in vial transport medium  Result Value Ref Range Status   SARS Coronavirus 2 by RT PCR POSITIVE (A) NEGATIVE Final    Comment: (NOTE) SARS-CoV-2 target nucleic acids are DETECTED.  The SARS-CoV-2 RNA is generally detectable in upper respiratory specimens during the acute phase of infection. Positive results are indicative of the presence of the identified virus, but do not rule out bacterial infection or co-infection with other pathogens not detected by the test. Clinical correlation with patient history and other diagnostic information is necessary to determine patient infection status. The expected result is Negative.  Fact Sheet for Patients: EntrepreneurPulse.com.au  Fact Sheet for Healthcare Providers: IncredibleEmployment.be  This test is not yet approved or cleared by the [place] FDA and  has been authorized for detection and/or diagnosis of SARS-CoV-2 by FDA under an Emergency Use Authorization (EUA).  This EUA will remain in effect (meaning this test can be used) for the duration of  the COVID-19 declaration  under Section 564(b)(1) of the A ct, 21 U.S.C. section 360bbb-3(b)(1), unless the authorization is terminated or revoked sooner.     Influenza A by PCR NEGATIVE NEGATIVE Final   Influenza B by PCR NEGATIVE NEGATIVE Final    Comment: (NOTE) The Xpert Xpress SARS-CoV-2/FLU/RSV plus assay is intended as an aid in the diagnosis of influenza from Nasopharyngeal swab specimens and should not be used as a sole basis for treatment. Nasal washings and aspirates are unacceptable for Xpert Xpress SARS-CoV-2/FLU/RSV testing.  Fact Sheet for Patients: EntrepreneurPulse.com.au  Fact Sheet for Healthcare Providers: IncredibleEmployment.be  This test is not yet approved or cleared by the [place]  States FDA and has been authorized for detection and/or diagnosis of SARS-CoV-2 by FDA under an Emergency Use Authorization (EUA). This EUA will remain in effect (meaning this test can be used) for the duration of the COVID-19 declaration under Section 564(b)(1) of the Act, 21 U.S.C. section 360bbb-3(b)(1), unless the authorization is terminated or revoked.  Performed at [facility], [place] [address], [place], [place] [address]      Medications:    sodium chloride   Intravenous Once   carvedilol  3.125 mg Oral BID WC   folic acid  1 mg Oral Daily   mouth rinse  15 mL Mouth Rinse BID   multivitamin with minerals  1 tablet Oral Daily   sodium chloride flush  3 mL Intravenous Q12H   Continuous Infusions:  sodium chloride     sodium chloride     heparin 850 Units/hr (12/22/21 2301)   remdesivir 100 mg in NS 100 mL        LOS: 1 day   Charlynne Cousins  [facility]  12/23/2021, 7:10 AM

## 2021-12-23 NOTE — TOC Initial Note (Signed)
Transition of Care (TOC) - Initial/Assessment Note  ? ? ?Patient Details  ?Name: Kara Mullins ?MRN: [id number] ?Date of Birth: 1951/02/16 ? ?Transition of Care (TOC) CM/SW Contact:    ?Tawanna Cooler, RN ?Phone Number: ?12/23/2021, 11:04 AM ? ?Clinical Narrative:                 ? ?Patient covid-19 positive, new onset Afib.   ?Per notes, at last inpatient discharge went to short term rehab at [facility], and was discharged to home a few weeks ago.   ?TOC following for discharge needs.  ? ? ? ?Expected Discharge Plan: [place] ?Barriers to Discharge: Continued Medical Work up ? ? ?Patient Goals and CMS Choice ?  ?  ?  ? ?Expected Discharge Plan and Services ?Expected Discharge Plan: [place] ?  ?  ?  ?  ?                ?  ?  ?  ?  ?  ?  ?  ?  ?  ?  ? ?Prior Living Arrangements/Services ?  ?  ?Patient language and need for interpreter reviewed:: Yes ?       ?Need for Family Participation in Patient Care: Yes (Comment) ?Care giver support system in place?: Yes (comment) ?  ?Criminal Activity/Legal Involvement Pertinent to Current Situation/Hospitalization: No - Comment as needed ? ?Activities of Daily Living ?  ?  ? ?Permission Sought/Granted ?  ?  ?   ?   ?   ?   ? ?Emotional Assessment ?  ?  ?  ?  ?Alcohol / Substance Use: Not Applicable ?Psych Involvement: No (comment) ? ?Admission diagnosis:  New onset atrial fibrillation ([place]) [I48.91] ?Hypothermia, initial encounter [T68.XXXA] ?Sepsis ([place]) [A41.9] ?COVID-19 virus infection [U07.1] ?Patient Active Problem List  ? Diagnosis Date Noted  ? Sepsis ([place]) 12/22/2021  ? COVID-19 virus infection 12/22/2021  ? Atrial fibrillation ([place]) 12/22/2021  ? CKD (chronic kidney disease) stage 5, GFR less than 15 ml/min (HCC) 12/22/2021  ? Anemia of chronic kidney failure, stage 5 ([place]) 12/22/2021  ? Prolonged QT interval 12/22/2021  ? Debility 11/20/2021  ? Metabolic acidosis 27/78/2423  ? Severe sepsis ([place]) 11/19/2021  ? Hypothermia  11/09/2021  ? Hyperlipidemia 11/09/2021  ? Chronic combined systolic and diastolic congestive heart failure ([place]) 11/09/2021  ? Stage 3b chronic kidney disease (CKD) ([place]) 11/09/2021  ? Pancytopenia ([place]) 11/09/2021  ? Severe protein-calorie malnutrition ([place]) 11/09/2021  ? Acute combined systolic and diastolic HF (heart failure) ([place])   ? AKI (acute kidney injury) ([place])   ? CHF exacerbation ([place]) 03/06/2021  ? Essential hypertension   ? Hypertensive urgency 03/05/2021  ? Acute CHF (congestive heart failure) ([place]) 03/05/2021  ? Nausea and vomiting 03/05/2021  ? Elevated troponin level not due myocardial infarction 03/05/2021  ? Elevated d-dimer 03/05/2021  ? Generalized anxiety disorder 03/05/2021  ? Acute congestive heart failure ([place]) 03/05/2021  ? ?PCP:  [name], [name], NP ?Pharmacy:   ?[facility] ?[place] [address] ?Phone: [phone] Fax: [phone] ? ? ? ? ?Social Determinants of Health (SDOH) Interventions ?  ? ?Readmission Risk Interventions ?Readmission Risk Prevention Plan 12/23/2021  ?Transportation Screening Complete  ?PCP or Specialist Appt within 3-5 Days Complete  ?[place] or Home Care Consult Complete  ?Social Work Scientific laboratory technician for [employer]  Planning/Counseling Complete  ?Palliative Care Screening Not Applicable  ?Medication Review Press photographer) Complete  ?Some recent data might be hidden  ? ? ? ?

## 2021-12-24 DIAGNOSIS — I4891 Unspecified atrial fibrillation: Secondary | ICD-10-CM | POA: Diagnosis not present

## 2021-12-24 DIAGNOSIS — D631 Anemia in chronic kidney disease: Secondary | ICD-10-CM

## 2021-12-24 DIAGNOSIS — N185 Chronic kidney disease, stage 5: Secondary | ICD-10-CM | POA: Diagnosis not present

## 2021-12-24 DIAGNOSIS — U071 COVID-19: Secondary | ICD-10-CM | POA: Diagnosis not present

## 2021-12-24 DIAGNOSIS — A419 Sepsis, unspecified organism: Secondary | ICD-10-CM | POA: Diagnosis not present

## 2021-12-24 DIAGNOSIS — Z7189 Other specified counseling: Secondary | ICD-10-CM | POA: Diagnosis not present

## 2021-12-24 LAB — CBC WITH DIFFERENTIAL/PLATELET
Abs Immature Granulocytes: 0.02 10*3/uL (ref 0.00–0.07)
Basophils Absolute: 0 10*3/uL (ref 0.0–0.1)
Basophils Relative: 0 %
Eosinophils Absolute: 0.1 10*3/uL (ref 0.0–0.5)
Eosinophils Relative: 2 %
HCT: 25.4 % — ABNORMAL LOW (ref 36.0–46.0)
Hemoglobin: 8.1 g/dL — ABNORMAL LOW (ref 12.0–15.0)
Immature Granulocytes: 0 %
Lymphocytes Relative: 19 %
Lymphs Abs: 1 10*3/uL (ref 0.7–4.0)
MCH: 27.2 pg (ref 26.0–34.0)
MCHC: 31.9 g/dL (ref 30.0–36.0)
MCV: 85.2 fL (ref 80.0–100.0)
Monocytes Absolute: 0.5 10*3/uL (ref 0.1–1.0)
Monocytes Relative: 8 %
Neutro Abs: 3.8 10*3/uL (ref 1.7–7.7)
Neutrophils Relative %: 71 %
Platelets: 54 10*3/uL — ABNORMAL LOW (ref 150–400)
RBC: 2.98 MIL/uL — ABNORMAL LOW (ref 3.87–5.11)
RDW: 18.4 % — ABNORMAL HIGH (ref 11.5–15.5)
WBC: 5.4 10*3/uL (ref 4.0–10.5)
nRBC: 0.6 % — ABNORMAL HIGH (ref 0.0–0.2)

## 2021-12-24 LAB — COMPREHENSIVE METABOLIC PANEL
ALT: 13 U/L (ref 0–44)
AST: 28 U/L (ref 15–41)
Albumin: 1.6 g/dL — ABNORMAL LOW (ref 3.5–5.0)
Alkaline Phosphatase: 100 U/L (ref 38–126)
Anion gap: 11 (ref 5–15)
BUN: 77 mg/dL — ABNORMAL HIGH (ref 8–23)
CO2: 15 mmol/L — ABNORMAL LOW (ref 22–32)
Calcium: 6.5 mg/dL — ABNORMAL LOW (ref 8.9–10.3)
Chloride: 118 mmol/L — ABNORMAL HIGH (ref 98–111)
Creatinine, Ser: 5.3 mg/dL — ABNORMAL HIGH (ref 0.44–1.00)
GFR, Estimated: 8 mL/min — ABNORMAL LOW (ref >60)
Glucose, Bld: 61 mg/dL — ABNORMAL LOW (ref 70–99)
Potassium: 4.1 mmol/L (ref 3.5–5.1)
Sodium: 144 mmol/L (ref 135–145)
Total Bilirubin: 0.3 mg/dL (ref 0.3–1.2)
Total Protein: 4.8 g/dL — ABNORMAL LOW (ref 6.5–8.1)

## 2021-12-24 LAB — TYPE AND SCREEN
ABO/RH(D): AB POS
Antibody Screen: NEGATIVE
Unit division: 0
Unit division: 0

## 2021-12-24 LAB — C-REACTIVE PROTEIN: CRP: 4 mg/dL — ABNORMAL HIGH (ref <1.0)

## 2021-12-24 LAB — BPAM RBC
Blood Product Expiration Date: 202303302359
Blood Product Expiration Date: 202304032359
ISSUE DATE / TIME: 202303121502
ISSUE DATE / TIME: 202303121750
Unit Type and Rh: 6200
Unit Type and Rh: 6200

## 2021-12-24 LAB — HEPARIN LEVEL (UNFRACTIONATED): Heparin Unfractionated: 0.4 IU/mL (ref 0.30–0.70)

## 2021-12-24 LAB — D-DIMER, QUANTITATIVE: D-Dimer, Quant: 1.99 ug/mL-FEU — ABNORMAL HIGH (ref 0.00–0.50)

## 2021-12-24 LAB — PROCALCITONIN: Procalcitonin: <0.1 ng/mL

## 2021-12-24 MED ORDER — SODIUM CHLORIDE 0.9 % IV SOLN: INTRAVENOUS | Status: AC

## 2021-12-24 NOTE — Progress Notes (Signed)
? ?                                                                                                                                                     ?                                                   ?Daily Progress Note  ? ?Patient Name: Fontella Shan       Date: 12/24/2021 ?DOB: 09/18/1951  Age: 71 y.o. MRN#: [id number] ?Attending Physician: Charlynne Cousins, MD ?Primary Care Physician: [name], NP ?Admit Date: 12/21/2021 ? ?Reason for Consultation/Follow-up: Establishing goals of care ? ?Subjective: ?I met today with Ms. Dedic and her daughter. ? ?She was lying in bed in no distress. ? ?We discussed her clinical situation and concerns about anemia and worsening renal function.  I shared with her my concern that her kidney function appears to be worsening and this may not be reversible process.  I also discussed with her concerns that heroic interventions in the event of a cardiac or respiratory arrest are not going to fix her underlying issues and my recommendation will be to reconsider full CODE STATUS as she had previously been DNR in the past. ? ?Ms. Mcculley acknowledged that she is scared and said that she is placing her faith in God to see her through.  I attempted to explore with her her thoughts on healing and death and how this relates to her religious beliefs, but she reached a point of quoting scripture and not really engaging in conversation other than to state, "I know who I believe in." ? ?Length of Stay: 2 ? ?Current Medications: ?Scheduled Meds:  ? sodium chloride   Intravenous Once  ? carvedilol  3.125 mg Oral BID WC  ? folic acid  1 mg Oral Daily  ? mouth rinse  15 mL Mouth Rinse BID  ? multivitamin with minerals  1 tablet Oral Daily  ? sodium chloride flush  3 mL Intravenous Q12H  ? ? ?Continuous Infusions: ? sodium chloride    ? sodium chloride    ? heparin 850 Units/hr (12/24/21 0723)  ? remdesivir 100 mg in NS 100 mL Stopped (12/23/21 1055)  ? ? ?PRN Meds: ?sodium chloride,  acetaminophen, guaiFENesin-dextromethorphan, senna-docusate, sodium chloride flush ? ?Physical Exam         ?General: Alert, awake, in no acute distress.   ?HEENT: No bruits, no goiter, no JVD ?Heart: Regular rate and rhythm. No murmur appreciated. ?Lungs: Good air movement, clear ?Abdomen: Soft, nontender, nondistended, positive bowel sounds.   ?Ext: No significant edema ?Skin: Warm and dry ?Neuro: Grossly intact, nonfocal.  ? ?Vital Signs: BP Marland Kitchen)  167/100 (BP Location: Right Arm)   Pulse (!) 58   Temp 98.2 ?F (36.8 ?C) (Oral)   Resp 16   Ht _0  (1.651 m)   Wt 93.7 kg   SpO2 100%   BMI 34.38 kg/m?  ?SpO2: SpO2: 100 % ?O2 Device: O2 Device: Room Air ?O2 Flow Rate:   ? ?Intake/output summary:  ?Intake/Output Summary (Last 24 hours) at 12/24/2021 0903 ?Last data filed at 12/24/2021 0600 ?Gross per 24 hour  ?Intake 2121.84 ml  ?Output 1025 ml  ?Net 1096.84 ml  ? ?LBM: Last BM Date : 12/22/21 ?Baseline Weight: Weight: 93.7 kg ?Most recent weight: Weight: 93.7 kg ? ?     ?Palliative Assessment/Data: ? ? ? ?Flowsheet Rows   ? ?Flowsheet Row Most Recent Value  ?Intake Tab   ?Referral Department Hospitalist  ?Unit at Time of Referral ICU  ?Palliative Care Primary Diagnosis Sepsis/Infectious Disease  ?Date Notified 12/22/21  ?Palliative Care Type Return patient Palliative Care  ?Reason for referral Clarify Goals of Care  ?Date of Admission 12/21/21  ?Date first seen by Palliative Care 12/22/21  ?# of days Palliative referral response time 0 Day(s)  ?# of days IP prior to Palliative referral 1  ?Clinical Assessment   ?Palliative Performance Scale Score 50%  ?Psychosocial & Spiritual Assessment   ?Palliative Care Outcomes   ?Patient/Family meeting held? Yes  ?Who was at the meeting? Patient, daughter  ?Palliative Care Outcomes Clarified goals of care  ? ?  ? ? ?Patient Active Problem List  ? Diagnosis Date Noted  ? Hypertensive urgency 03/05/2021  ? Acute CHF (congestive heart failure) ([place]) 03/05/2021  ? Elevated  d-dimer 03/05/2021  ? Nausea and vomiting 03/05/2021  ? Elevated troponin level not due myocardial infarction 03/05/2021  ? Generalized anxiety disorder 03/05/2021  ? Sepsis ([place]) 12/22/2021  ? COVID-19 virus infection 12/22/2021  ? Hypothermia 11/09/2021  ? Atrial fibrillation ([place]) 12/22/2021  ? CKD (chronic kidney disease) stage 5, GFR less than 15 ml/min (HCC) 12/22/2021  ? Chronic combined systolic and diastolic congestive heart failure ([place]) 11/09/2021  ? Anemia of chronic kidney failure, stage 5 ([place]) 12/22/2021  ? Prolonged QT interval 12/22/2021  ? Debility 11/20/2021  ? Metabolic acidosis 07/68/0881  ? Severe sepsis ([place]) 11/19/2021  ? Hyperlipidemia 11/09/2021  ? Stage 3b chronic kidney disease (CKD) ([place]) 11/09/2021  ? Pancytopenia ([place]) 11/09/2021  ? Severe protein-calorie malnutrition ([place]) 11/09/2021  ? Acute combined systolic and diastolic HF (heart failure) ([place])   ? AKI (acute kidney injury) ([place])   ? CHF exacerbation ([place]) 03/06/2021  ? Essential hypertension   ? Acute congestive heart failure ([place]) 03/05/2021  ? ? ?Palliative Care Assessment & Plan  ? ?Patient Profile: ?71 y.o. 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  She was brought to ED after her daughter worried she was getting septic again at home and was found to be COVID-positive with bilateral infiltrates on x-ray.  Started on remdesivir for COVID and heparin as she was also found to be in A-fib with RVR.  Palliative consulted for goals of care. ?  ? ?Recommendations/Plan: ?Full code/full scope: Ms. Geers continues to endorse wanting full code/full scope treatment.  Attempted to discuss with her that aggressive interventions are not going to restore her to meaningful level of function if she suffers from respiratory or cardiac arrest related to  worsening renal failure as this is not a fixable problem.  She is still not in a place where she is able to emotionally engage in this discussion and reports that she is placing her faith in God for healing. ?We will continue to follow-up and progress conversation as she is emotionally able to do so. ? ?Goals of Care and Additional Recommendations: ?Limitations on Scope of Treatment: Full Scope Treatment ? ?Code Status: ? ?  ?Code Status Orders  ?(From admission, onward)  ?  ? ? ?  ? ?  Start     Ordered  ? 12/22/21 0514  Full code  Continuous       ? 12/22/21 0513  ? ?  ?  ? ?  ? ?Code Status History   ? ? Date Active Date Inactive Code Status Order ID Comments User Context  ? 11/17/2021 1741 11/24/2021 0051 DNR [id number]  Barb Merino, MD Inpatient  ? 11/09/2021 0734 11/17/2021 1741 Full Code [id number]  Reubin Milan, MD Inpatient  ? 03/05/2021 0735 03/15/2021 1931 Full Code [id number]  Vernelle Emerald, MD ED  ? ?  ? ?Advance Directive Documentation   ? ?Flowsheet Row Most Recent Value  ?Type of Advance Directive Living will  ?Pre-existing out of facility DNR order (yellow form or pink MOST form) --  ?"MOST" Form in Place? --  ? ?  ? ? ?Prognosis: ? Unable to determine, but if her kidney function continues to worsen, prognosis will be days to weeks. ? ?Discharge Planning: ?To Be Determined ? ?Care plan was discussed with patient, her daughter ? ?Thank you for allowing the Palliative Medicine Team to assist in the care of this patient. ? ? ?Total Time 60 Prolonged Time Billed  no   ? ?Micheline Rough, MD ? ?Please contact Palliative Medicine Team phone at [phone] for questions and concerns.  ? ? ? ? ? ?

## 2021-12-24 NOTE — Progress Notes (Signed)
ANTICOAGULATION CONSULT NOTE ? ?Pharmacy Consult for heparin ?Indication: atrial fibrillation ? ?Allergies  ?Allergen Reactions  ? Chlorhexidine Other (See Comments)  ?  Unknown reaction  ? ? ?Patient Measurements: ?Height: 5\' 5"  (165.1 cm) ?Weight: 93.7 kg (206 lb 9.1 oz) ?IBW/kg (Calculated) : 57 ?Heparin Dosing Weight: 85.3kg ? ?Vital Signs: ?Temp: 98.2 ?F (36.8 ?C) (03/13 5188) ?Temp Source: Oral (03/13 4166) ?BP: 132/86 (03/13 0630) ?Pulse Rate: 67 (03/13 0937) ? ?Labs: ?Recent Labs  ?  12/22/21 ?0010 12/22/21 ?1141 12/23/21 ?0151 12/23/21 ?1601 12/23/21 ?1424 12/24/21 ?0330  ?HGB 8.0*  --  6.6*  --   --  8.1*  ?HCT 25.2*  --  20.8*  --   --  25.4*  ?PLT 57*  --  58*  --   --  54*  ?LABPROT 13.9  --   --   --   --   --   ?INR 1.1  --   --   --   --   --   ?HEPARINUNFRC  --    < >  --  0.61 0.53 0.40  ?CREATININE 4.77*  --  5.09*  --   --  5.30*  ? < > = values in this interval not displayed.  ? ? ? ?Estimated Creatinine Clearance: 11.3 mL/min (A) (by C-G formula based on SCr of 5.3 mg/dL (H)). ? ? ?Medical History: ?Past Medical History:  ?Diagnosis Date  ? Chronic combined systolic and diastolic congestive heart failure ([place]) 11/09/2021  ? Generalized anxiety disorder   ? Hyperlipidemia 11/09/2021  ? Hypertension   ? Stage 3b chronic kidney disease (CKD) ([place]) 11/09/2021  ? ? ?Assessment: ?71 yo pt with new onset afib, pharmacy consulted to dose heparin.  History of CKD stage V and anemia of chronic disease.  No prior AC noted.  Thrombocytopenia noted to be likely due to sepsis. ? ?Today, 12/24/21 ?Heparin level continues to be therapeutic at current IV heparin rate of 850 units/hr ?Hgb improved s/p blood transfusion on 3/12, plt low at 54 (plt 64 on 3/8), Scr 5.30 ?No bleeding reported by RN and no issues with infusion ? ? ?Goal of Therapy:  ?Heparin level 0.3-0.7 units/ml ?Monitor platelets by anticoagulation protocol: Yes ?  ?Plan:  ?Continue IV heparin at 850 units/hr ?Monitor daily heparin level, CBC,  signs/symptoms of bleeding ? ? ?Adrian Saran, PharmD, BCPS ?Secure Chat if ?s ?12/24/2021 10:11 AM ? ? ?

## 2021-12-24 NOTE — Progress Notes (Signed)
?   12/24/21 1445  ?Clinical Encounter Type  ?Visited With Patient not available  ? ?Chaplain Burris attempted to visit with Pt. Pt appeared to be sleeping very soundly; will attempt to f/u later on 3/13. ? ?Will clear consult request but highlight for f/u care if unable to engage Pt today. ?

## 2021-12-24 NOTE — Progress Notes (Signed)
? ?                                                                                                                                                     ?                                                   ?Daily Progress Note  ? ?Patient Name: Kara Mullins       Date: 12/24/2021 ?DOB: February 06, 1951  Age: 71 y.o. MRN#: [id number] ?Attending Physician: Charlynne Cousins, MD ?Primary Care Physician: [name], NP ?Admit Date: 12/21/2021 ? ?Reason for Consultation/Follow-up: Establishing goals of care ? ?Subjective: ?I saw and examined Kara Mullins today.  She was lying in bed in no distress. ? ?Talk with her more about concerns regarding continued progression of renal failure.  She was flat with her answers and less open to discussion with me today (not sure if she is upset from conversation yesterday or this because her daughter is not present at time of my encounter). ? ?She again expressed desire for continued aggressive interventions and that she feels God is going to heal her. ? ?Length of Stay: 2 ? ?Current Medications: ?Scheduled Meds:  ? sodium chloride   Intravenous Once  ? carvedilol  3.125 mg Oral BID WC  ? folic acid  1 mg Oral Daily  ? mouth rinse  15 mL Mouth Rinse BID  ? multivitamin with minerals  1 tablet Oral Daily  ? sodium chloride flush  3 mL Intravenous Q12H  ? ? ?Continuous Infusions: ? sodium chloride    ? sodium chloride Stopped (12/24/21 1949)  ? heparin 850 Units/hr (12/24/21 1754)  ? ? ?PRN Meds: ?sodium chloride, acetaminophen, guaiFENesin-dextromethorphan, senna-docusate, sodium chloride flush ? ?Physical Exam         ?General: Alert, awake, in no acute distress.   ?HEENT: No bruits, no goiter, no JVD ?Heart: Regular rate and rhythm. No murmur appreciated. ?Lungs: Good air movement, clear ?Abdomen: Soft, nontender, nondistended, positive bowel sounds.   ?Ext: No significant edema ?Skin: Warm and dry ?Neuro: Grossly intact, nonfocal.  ? ?Vital Signs: BP (!) 172/95   Pulse 62   Temp  98.2 ?F (36.8 ?C) (Oral)   Resp 20   Ht 5' 5"  (1.651 m)   Wt 93.7 kg   SpO2 100%   BMI 34.38 kg/m?  ?SpO2: SpO2: 100 % ?O2 Device: O2 Device: Room Air ?O2 Flow Rate:   ? ?Intake/output summary:  ?Intake/Output Summary (Last 24 hours) at 12/24/2021 2108 ?Last data filed at 12/24/2021 2000 ?Gross per 24 hour  ?Intake 998.88 ml  ?Output 1225 ml  ?Net -226.12 ml  ? ? ?LBM:  Last BM Date : 12/24/21 ?Baseline Weight: Weight: 93.7 kg ?Most recent weight: Weight: 93.7 kg ? ?     ?Palliative Assessment/Data: ? ? ? ?Flowsheet Rows   ? ?Flowsheet Row Most Recent Value  ?Intake Tab   ?Referral Department Hospitalist  ?Unit at Time of Referral ICU  ?Palliative Care Primary Diagnosis Sepsis/Infectious Disease  ?Date Notified 12/22/21  ?Palliative Care Type Return patient Palliative Care  ?Reason for referral Clarify Goals of Care  ?Date of Admission 12/21/21  ?Date first seen by Palliative Care 12/22/21  ?# of days Palliative referral response time 0 Day(s)  ?# of days IP prior to Palliative referral 1  ?Clinical Assessment   ?Palliative Performance Scale Score 50%  ?Psychosocial & Spiritual Assessment   ?Palliative Care Outcomes   ?Patient/Family meeting held? Yes  ?Who was at the meeting? Patient, daughter  ?Palliative Care Outcomes Clarified goals of care  ? ?  ? ? ?Patient Active Problem List  ? Diagnosis Date Noted  ? Hypertensive urgency 03/05/2021  ? Acute CHF (congestive heart failure) ([place]) 03/05/2021  ? Elevated d-dimer 03/05/2021  ? Nausea and vomiting 03/05/2021  ? Elevated troponin level not due myocardial infarction 03/05/2021  ? Generalized anxiety disorder 03/05/2021  ? Sepsis ([place]) 12/22/2021  ? COVID-19 virus infection 12/22/2021  ? Hypothermia 11/09/2021  ? Atrial fibrillation ([place]) 12/22/2021  ? CKD (chronic kidney disease) stage 5, GFR less than 15 ml/min (HCC) 12/22/2021  ? Chronic combined systolic and diastolic congestive heart failure ([place]) 11/09/2021  ? Anemia of chronic kidney failure, stage 5 ([place])  12/22/2021  ? Prolonged QT interval 12/22/2021  ? Debility 11/20/2021  ? Metabolic acidosis 03/00/9233  ? Severe sepsis ([place]) 11/19/2021  ? Hyperlipidemia 11/09/2021  ? Stage 3b chronic kidney disease (CKD) ([place]) 11/09/2021  ? Pancytopenia ([place]) 11/09/2021  ? Severe protein-calorie malnutrition ([place]) 11/09/2021  ? Acute combined systolic and diastolic HF (heart failure) ([place])   ? AKI (acute kidney injury) ([place])   ? CHF exacerbation ([place]) 03/06/2021  ? Essential hypertension   ? Acute congestive heart failure ([place]) 03/05/2021  ? ? ?Palliative Care Assessment & Plan  ? ?Patient Profile: ?71 y.o. 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  She was brought to ED after her daughter worried she was getting septic again at home and was found to be COVID-positive with bilateral infiltrates on x-ray.  Started on remdesivir for COVID and heparin as she was also found to be in A-fib with RVR.  Palliative consulted for goals of care. ?  ? ?Recommendations/Plan: ?Full code/full scope: Kara Mullins continues to endorse wanting full code/full scope treatment.  I again talked with her about concern that aggressive interventions are not going to restore her to meaningful level of function if she suffers from respiratory or cardiac arrest related to worsening renal failure as this is not a fixable problem.  She is still not in a place where she is able to emotionally engage in this discussion and reports that she is placing her faith in God for healing. ?Palliative to continue to follow and progress conversation as she is emotionally able to do so.   ? ?Goals of Care and Additional Recommendations: ?Limitations on Scope of Treatment: Full Scope Treatment ? ?Code Status: ? ?  ?Code Status Orders  ?(From admission, onward)  ?  ? ? ?  ? ?  Start      Ordered  ? 12/22/21 0514  Full code  Continuous       ? 12/22/21 0513  ? ?  ?  ? ?  ? ?Code Status History   ? ? Date Active Date Inactive Code Status Order ID Comments User Context  ? 11/17/2021 1741 11/24/2021 0051 DNR [id number]  Barb Merino, MD Inpatient  ? 11/09/2021 0734 11/17/2021 1741 Full Code [id number]  Reubin Milan, MD Inpatient  ? 03/05/2021 0735 03/15/2021 1931 Full Code [id number]  Vernelle Emerald, MD ED  ? ?  ? ?Advance Directive Documentation   ? ?Flowsheet Row Most Recent Value  ?Type of Advance Directive Living will  ?Pre-existing out of facility DNR order (yellow form or pink MOST form) --  ?"MOST" Form in Place? --  ? ?  ? ? ?Prognosis: ? Unable to determine, but if her kidney function continues to worsen, prognosis will be days to weeks. ? ?Discharge Planning: ?To Be Determined ? ?Care plan was discussed with patient ? ?Thank you for allowing the Palliative Medicine Team to assist in the care of this patient. ? ? ?Micheline Rough, MD ? ?Please contact Palliative Medicine Team phone at [phone] for questions and concerns.  ? ? ? ? ? ?

## 2021-12-24 NOTE — Progress Notes (Signed)
[facility] PROGRESS NOTE    Progress Note  Kara Mullins  YJE:[id number] DOB: [birth year] DOA: 12/21/2021 PCP: [name], NP     Brief Narrative:   Kara Mullins is an 71 y.o. 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 Eventually from SNF was discharged home daughter, relates that the day prior to admission she had a low blood pressure and a variable heart rate she was concerned she might be getting septic again, brought  her in the emergency room was found to be hypothermic and bradycardic placed on a Bair hugger work-up revealed she was COVID-positive chest x-ray showed bilateral infiltratesd,was started on IV remdesivir and IV heparin infusion as she was found to be in A-fib with RVR    Assessment/Plan:   Sepsis ([place]) probably due to COVID-19 pneumonia: Daughter relates she tested positive at the facility on 11/27/2021. We will continue treatment of IV remdesivir for total of 5 days Continue inhalers. She is currently on IV heparin for A-fib. Despite IV fluid hydration her creatinine continues to deteriorate, she is not a candidate for dialysis. Palliative care discussed with family transitioning to hospice.  Thrombocytopenia: Likely due to sepsis, platelets have stabilized continue to monitor closely.  Hypothermia: Likely due to sepsis Resolved.  Chronic atrial fibrillation: Continue Coreg and IV heparin.  Chronic kidney disease stage V: Not a candidate for dialysis Deemed not a good candidate for hemodialysis for acute kidney injury on the last admission. Renal function continues to deteriorate despite IV fluid hydration. Family discussed with palliative care at end-of-life and hospice.  Chronic combined systolic and diastolic heart  failure: Last 2D echo showed an EF of 50% cardiology was concerned about cardiac amyloidosis as her work-up showed abnormal light chain she underwent bone marrow biopsy with hematology cardiac MRI on 04/30/2021 showed no evidence of cardiac amyloid We will be judicious with IV fluids.  Abnormal thyroid function: Will need to be recheck in 4 to 6 weeks, probably sick euthyroid syndrome  Anemia of chronic renal disease: Drop in hemoglobin to 6.6 she is at as well as units of packed red blood cells her hemoglobin this morning is 8.1.  Prolonged QTc: Keep the potassium greater than 4 magnesium greater than 2.  Thrombocytopenia: Likely due to sepsis physiology, PT and INR unremarkable, will continue to follow  Prolonged QT interval: Mildly prolonged corrected potassium greater than 4 magnesium greater than 2.    Ethics: Extremely poor prognosis.  Family met with palliative care the patient has not emotional things which she is getting a discussion.  Meeting with her today. Patient wants to be full code    DVT prophylaxis: heparin Family Communication:daughter Status is: Inpatient Remains inpatient appropriate because: Sepsis    Code Status:     Code Status Orders  (From admission, onward)           Start     Ordered   12/22/21 0514  Full code  Continuous        12/22/21 0513           Code Status History     Date Active Date Inactive Code Status Order ID Comments User Context   11/17/2021 1741 11/24/2021 0051 DNR [id number]  Barb Merino, MD Inpatient   11/09/2021 0734 11/17/2021 1741 Full Code [id number]  Reubin Milan,  MD Inpatient   03/05/2021 0735 03/15/2021 1931 Full Code [id number]  Vernelle Emerald, MD ED         IV Access:   Peripheral IV   Procedures and diagnostic studies:   No results found.   Medical Consultants:   None.   Subjective:    Kara Mullins has no new complaints  Objective:    Vitals:   12/23/21 2215 12/23/21 2353  12/24/21 0106 12/24/21 0609  BP:   (!) 139/120 (!) 167/100  Pulse:   64 (!) 58  Resp:   18 16  Temp: (!) 97.4 F (36.3 C) (!) 97.4 F (36.3 C) 97.6 F (36.4 C) 98.2 F (36.8 C)  TempSrc: Oral Oral Oral Oral  SpO2:   100% 100%  Weight:      Height:       SpO2: 100 %   Intake/Output Summary (Last 24 hours) at 12/24/2021 0840 Last data filed at 12/24/2021 0600 Gross per 24 hour  Intake 2121.84 ml  Output 1025 ml  Net 1096.84 ml    Filed Weights   12/23/21 1230  Weight: 93.7 kg    Exam: General exam: In no acute distress. Respiratory system: Good air movement and clear to auscultation. Cardiovascular system: S1 & S2 heard, RRR. No JVD. Gastrointestinal system: Abdomen is nondistended, soft and nontender.  Extremities: No pedal edema. Skin: No rashes, lesions or ulcers    Data Reviewed:    Labs: Basic Metabolic Panel: Recent Labs  Lab 12/19/21 1020 12/22/21 0010 12/23/21 0151 12/24/21 0330  NA 147* 140 140 144  K 4.1 3.8 4.0 4.1  CL 117* 116* 117* 118*  CO2 17* 18* 15* 15*  GLUCOSE 55* 76 92 61*  BUN 57* 67* 76* 77*  CREATININE 4.35* 4.77* 5.09* 5.30*  CALCIUM 7.0* 6.6* 6.5* 6.5*  MG 3.0*  --   --   --     GFR Estimated Creatinine Clearance: 11.3 mL/min (A) (by C-G formula based on SCr of 5.3 mg/dL (H)). Liver Function Tests: Recent Labs  Lab 12/19/21 1020 12/22/21 0010 12/23/21 0151 12/24/21 0330  AST 33 35 29 28  ALT 17 17 15 13   ALKPHOS 168* 138* 108 100  BILITOT 0.3 0.2* 0.3 0.3  PROT 5.1* 5.7* 4.8* 4.8*  ALBUMIN 2.2* 1.9* 1.6* 1.6*    No results for input(s): LIPASE, AMYLASE in the last 168 hours. No results for input(s): AMMONIA in the last 168 hours. Coagulation profile Recent Labs  Lab 12/22/21 0010  INR 1.1    COVID-19 Labs  Recent Labs    12/22/21 0010 12/23/21 0151 12/23/21 0634 12/24/21 0330  DDIMER 2.75* 2.00*  --  1.99*  CRP  --   --  0.5 4.0*     Lab Results  Component Value Date   SARSCOV2NAA POSITIVE (A)  12/22/2021   [place] NEGATIVE 11/09/2021   [place] NEGATIVE 03/05/2021    CBC: Recent Labs  Lab 12/19/21 1020 12/22/21 0010 12/23/21 0151 12/24/21 0330  WBC 4.0 4.0 4.8 5.4  NEUTROABS  --  3.0 3.6 3.8  HGB 8.1* 8.0* 6.6* 8.1*  HCT 24.3* 25.2* 20.8* 25.4*  MCV 81 86.6 86.3 85.2  PLT 64* 57* 58* 54*    Cardiac Enzymes: No results for input(s): CKTOTAL, CKMB, CKMBINDEX, TROPONINI in the last 168 hours. BNP (last 3 results) No results for input(s): PROBNP in the last 8760 hours. CBG: Recent Labs  Lab 12/21/21 2359  GLUCAP 73    D-Dimer: Recent Labs    12/23/21  0151 12/24/21 0330  DDIMER 2.00* 1.99*    Hgb A1c: No results for input(s): HGBA1C in the last 72 hours. Lipid Profile: No results for input(s): CHOL, HDL, LDLCALC, TRIG, CHOLHDL, LDLDIRECT in the last 72 hours. Thyroid function studies: Recent Labs    12/22/21 0011  TSH 7.613*    Anemia work up: No results for input(s): VITAMINB12, FOLATE, FERRITIN, TIBC, IRON, RETICCTPCT in the last 72 hours. Sepsis Labs: Recent Labs  Lab 12/19/21 1020 12/22/21 0010 12/22/21 0120 12/22/21 0515 12/23/21 0151 12/24/21 0330  PROCALCITON  --   --   --  <0.10 <0.10 <0.10  WBC 4.0 4.0  --   --  4.8 5.4  LATICACIDVEN  --  0.4* 0.6  --   --   --     Microbiology Recent Results (from the past 240 hour(s))  Culture, blood (Routine x 2)     Status: None (Preliminary result)   Collection Time: 12/22/21 12:10 AM   Specimen: BLOOD  Result Value Ref Range Status   Specimen Description   Final    BLOOD BLOOD LEFT ARM Performed at [facility], [place] [address] [place], [place] [address]    Special Requests   Final    BOTTLES DRAWN AEROBIC AND ANAEROBIC Blood Culture results may not be optimal due to an excessive volume of blood received in culture bottles Performed at [facility] [address] [place], [place] [address]    Culture   Final    NO GROWTH 2  DAYS Performed at [facility] [address], [place], [place] [address]    Report Status PENDING  Incomplete  Culture, blood (Routine x 2)     Status: None (Preliminary result)   Collection Time: 12/22/21 12:11 AM   Specimen: BLOOD  Result Value Ref Range Status   Specimen Description   Final    BLOOD BLOOD RIGHT ARM Performed at [facility] [address] [place], [place] [address]    Special Requests   Final    BOTTLES DRAWN AEROBIC AND ANAEROBIC Blood Culture results may not be optimal due to an excessive volume of blood received in culture bottles Performed at [facility] [address], [place], [place] [address]    Culture   Final    NO GROWTH 2 DAYS Performed at [facility] [address] [place], [place] [address]    Report Status PENDING  Incomplete  Resp Panel by RT-PCR (Flu A&B, Covid)     Status: Abnormal   Collection Time: 12/22/21 12:11 AM   Specimen: Nasopharyngeal(NP) swabs in vial transport medium  Result Value Ref Range Status   SARS Coronavirus 2 by RT PCR POSITIVE (A) NEGATIVE Final    Comment: (NOTE) SARS-CoV-2 target nucleic acids are DETECTED.  The SARS-CoV-2 RNA is generally detectable in upper respiratory specimens during the acute phase of infection. Positive results are indicative of the presence of the identified virus, but do not rule out bacterial infection or co-infection with other pathogens not detected by the test. Clinical correlation with patient history and other diagnostic information is necessary to determine patient infection status. The expected result is Negative.  Fact Sheet for Patients: EntrepreneurPulse.com.au  Fact Sheet for Healthcare Providers: IncredibleEmployment.be  This test is not yet approved or cleared by the [place] FDA and  has been authorized for detection and/or diagnosis of SARS-CoV-2 by FDA under an Emergency  Use Authorization (EUA).  This EUA  will remain in effect (meaning this test can be used) for the duration of  the COVID-19 declaration under Section 564(b)(1) of the A ct, 21 U.S.C. section 360bbb-3(b)(1), unless the authorization is terminated or revoked sooner.     Influenza A by PCR NEGATIVE NEGATIVE Final   Influenza B by PCR NEGATIVE NEGATIVE Final    Comment: (NOTE) The Xpert Xpress SARS-CoV-2/FLU/RSV plus assay is intended as an aid in the diagnosis of influenza from Nasopharyngeal swab specimens and should not be used as a sole basis for treatment. Nasal washings and aspirates are unacceptable for Xpert Xpress SARS-CoV-2/FLU/RSV testing.  Fact Sheet for Patients: EntrepreneurPulse.com.au  Fact Sheet for Healthcare Providers: IncredibleEmployment.be  This test is not yet approved or cleared by the [place] FDA and has been authorized for detection and/or diagnosis of SARS-CoV-2 by FDA under an Emergency Use Authorization (EUA). This EUA will remain in effect (meaning this test can be used) for the duration of the COVID-19 declaration under Section 564(b)(1) of the Act, 21 U.S.C. section 360bbb-3(b)(1), unless the authorization is terminated or revoked.  Performed at [facility], [place] [address] [place], [place] [address]   Urine Culture     Status: None   Collection Time: 12/22/21 12:24 AM   Specimen: Urine, Catheterized  Result Value Ref Range Status   Specimen Description   Final    URINE, CATHETERIZED Performed at [facility] [address] [place], [place] [address]    Special Requests   Final    NONE Performed at [facility], [place] [address], [place], [place] [address]    Culture   Final    NO GROWTH Performed at [facility], [place] [address] [place], [place] [address]    Report Status 12/23/2021 FINAL  Final  MRSA Next Gen by PCR, Nasal     Status:  Abnormal   Collection Time: 12/22/21  5:57 PM   Specimen: Nasal Mucosa; Nasal Swab  Result Value Ref Range Status   MRSA by PCR Next Gen DETECTED (A) NOT DETECTED Final    Comment: (NOTE) The GeneXpert MRSA Assay (FDA approved for NASAL specimens only), is one component of a comprehensive MRSA colonization surveillance program. It is not intended to diagnose MRSA infection nor to guide or monitor treatment for MRSA infections. Test performance is not FDA approved in patients less than 45 years old. Performed at [facility], [place] [address], [place], [place] [address]      Medications:    sodium chloride   Intravenous Once   carvedilol  3.125 mg Oral BID WC   folic acid  1 mg Oral Daily   mouth rinse  15 mL Mouth Rinse BID   multivitamin with minerals  1 tablet Oral Daily   sodium chloride flush  3 mL Intravenous Q12H   Continuous Infusions:  sodium chloride     heparin 850 Units/hr (12/24/21 0723)   remdesivir 100 mg in NS 100 mL Stopped (12/23/21 1055)      LOS: 2 days   Charlynne Cousins  [facility]  12/24/2021, 8:40 AM

## 2021-12-25 DIAGNOSIS — Z515 Encounter for palliative care: Secondary | ICD-10-CM

## 2021-12-25 DIAGNOSIS — I5042 Chronic combined systolic (congestive) and diastolic (congestive) heart failure: Secondary | ICD-10-CM | POA: Diagnosis not present

## 2021-12-25 DIAGNOSIS — R531 Weakness: Secondary | ICD-10-CM

## 2021-12-25 DIAGNOSIS — Z7189 Other specified counseling: Secondary | ICD-10-CM | POA: Diagnosis not present

## 2021-12-25 DIAGNOSIS — T68XXXA Hypothermia, initial encounter: Secondary | ICD-10-CM | POA: Diagnosis not present

## 2021-12-25 DIAGNOSIS — A419 Sepsis, unspecified organism: Secondary | ICD-10-CM | POA: Diagnosis not present

## 2021-12-25 DIAGNOSIS — N185 Chronic kidney disease, stage 5: Secondary | ICD-10-CM | POA: Diagnosis not present

## 2021-12-25 LAB — HEPARIN LEVEL (UNFRACTIONATED): Heparin Unfractionated: 0.4 IU/mL (ref 0.30–0.70)

## 2021-12-25 LAB — COMPREHENSIVE METABOLIC PANEL
ALT: 14 U/L (ref 0–44)
AST: 27 U/L (ref 15–41)
Albumin: <1.5 g/dL — ABNORMAL LOW (ref 3.5–5.0)
Alkaline Phosphatase: 105 U/L (ref 38–126)
Anion gap: 11 (ref 5–15)
BUN: 79 mg/dL — ABNORMAL HIGH (ref 8–23)
CO2: 15 mmol/L — ABNORMAL LOW (ref 22–32)
Calcium: 6.3 mg/dL — CL (ref 8.9–10.3)
Chloride: 117 mmol/L — ABNORMAL HIGH (ref 98–111)
Creatinine, Ser: 5.42 mg/dL — ABNORMAL HIGH (ref 0.44–1.00)
GFR, Estimated: 8 mL/min — ABNORMAL LOW (ref >60)
Glucose, Bld: 74 mg/dL (ref 70–99)
Potassium: 4 mmol/L (ref 3.5–5.1)
Sodium: 143 mmol/L (ref 135–145)
Total Bilirubin: 0.4 mg/dL (ref 0.3–1.2)
Total Protein: 4.5 g/dL — ABNORMAL LOW (ref 6.5–8.1)

## 2021-12-25 LAB — CBC WITH DIFFERENTIAL/PLATELET
Abs Immature Granulocytes: 0.02 10*3/uL (ref 0.00–0.07)
Basophils Absolute: 0 10*3/uL (ref 0.0–0.1)
Basophils Relative: 0 %
Eosinophils Absolute: 0.1 10*3/uL (ref 0.0–0.5)
Eosinophils Relative: 2 %
HCT: 25.7 % — ABNORMAL LOW (ref 36.0–46.0)
Hemoglobin: 8.4 g/dL — ABNORMAL LOW (ref 12.0–15.0)
Immature Granulocytes: 0 %
Lymphocytes Relative: 22 %
Lymphs Abs: 1.3 10*3/uL (ref 0.7–4.0)
MCH: 27.6 pg (ref 26.0–34.0)
MCHC: 32.7 g/dL (ref 30.0–36.0)
MCV: 84.5 fL (ref 80.0–100.0)
Monocytes Absolute: 0.5 10*3/uL (ref 0.1–1.0)
Monocytes Relative: 9 %
Neutro Abs: 3.9 10*3/uL (ref 1.7–7.7)
Neutrophils Relative %: 67 %
Platelets: 51 10*3/uL — ABNORMAL LOW (ref 150–400)
RBC: 3.04 MIL/uL — ABNORMAL LOW (ref 3.87–5.11)
RDW: 18.8 % — ABNORMAL HIGH (ref 11.5–15.5)
WBC: 5.8 10*3/uL (ref 4.0–10.5)
nRBC: 0.3 % — ABNORMAL HIGH (ref 0.0–0.2)

## 2021-12-25 LAB — D-DIMER, QUANTITATIVE: D-Dimer, Quant: 1.68 ug/mL-FEU — ABNORMAL HIGH (ref 0.00–0.50)

## 2021-12-25 LAB — C-REACTIVE PROTEIN
CRP: 3 mg/dL — ABNORMAL HIGH (ref <1.0)
CRP: 5.5 mg/dL — ABNORMAL HIGH (ref <1.0)

## 2021-12-25 NOTE — Telephone Encounter (Signed)
Patients daughter called asking if the paperwork has been completed and faxed?  ? ?Please advise.   ?

## 2021-12-25 NOTE — Telephone Encounter (Signed)
Current admission 

## 2021-12-25 NOTE — Progress Notes (Signed)
ANTICOAGULATION CONSULT NOTE ? ?Pharmacy Consult for heparin ?Indication: atrial fibrillation ? ?Allergies  ?Allergen Reactions  ? Chlorhexidine Other (See Comments)  ?  Unknown reaction  ? ? ?Patient Measurements: ?Height: 5\' 5"  (165.1 cm) ?Weight: 93.7 kg (206 lb 9.1 oz) ?IBW/kg (Calculated) : 57 ?Heparin Dosing Weight: 85.3kg ? ?Vital Signs: ?Temp: 98.3 ?F (36.8 ?C) (03/14 3474) ?Temp Source: Oral (03/14 2595) ?BP: 160/85 (03/14 0629) ?Pulse Rate: 59 (03/14 0629) ? ?Labs: ?Recent Labs  ?  12/23/21 ?0151 12/23/21 ?0634 12/23/21 ?1424 12/24/21 ?0330 12/25/21 ?0342  ?HGB 6.6*  --   --  8.1* 8.4*  ?HCT 20.8*  --   --  25.4* 25.7*  ?PLT 58*  --   --  54* 51*  ?HEPARINUNFRC  --    < > 0.53 0.40 0.40  ?CREATININE 5.09*  --   --  5.30* 5.42*  ? < > = values in this interval not displayed.  ? ? ? ?Estimated Creatinine Clearance: 11.1 mL/min (A) (by C-G formula based on SCr of 5.42 mg/dL (H)). ? ? ?Medical History: ?Past Medical History:  ?Diagnosis Date  ? Chronic combined systolic and diastolic congestive heart failure ([place]) 11/09/2021  ? Generalized anxiety disorder   ? Hyperlipidemia 11/09/2021  ? Hypertension   ? Stage 3b chronic kidney disease (CKD) ([place]) 11/09/2021  ? ? ?Assessment: ?71 yo pt with new onset afib, pharmacy consulted to dose heparin.  History of CKD stage V and anemia of chronic disease.  No prior AC noted.  Thrombocytopenia noted to be likely due to sepsis. ? ?Today, 12/25/21 ?Heparin level continues to be therapeutic at current IV heparin rate of 850 units/hr ?Hgb improved s/p blood transfusion on 3/12, plt continues to be low at 51 (plt 64 on 3/8), Scr 5.42 ?No bleeding reported by RN and no issues with infusion ? ? ?Goal of Therapy:  ?Heparin level 0.3-0.7 units/ml ?Monitor platelets by anticoagulation protocol: Yes ?  ?Plan:  ?Continue IV heparin at 850 units/hr ?Monitor daily heparin level, CBC, signs/symptoms of bleeding ?Awaiting discharge plan for anticoagulation ? ? ?Adrian Saran, PharmD,  BCPS ?Secure Chat if ?s ?12/25/2021 8:01 AM ? ? ?

## 2021-12-25 NOTE — Telephone Encounter (Signed)
Paper work already completed last week and Faxed.  ?

## 2021-12-25 NOTE — Plan of Care (Signed)

## 2021-12-25 NOTE — Progress Notes (Signed)
? ?                                                                                                                                                     ?                                                   ?Daily Progress Note  ? ?Patient Name: Kara Mullins       Date: 12/25/2021 ?DOB: Feb 09, 1951  Age: 71 y.o. MRN#: [id number] ?Attending Physician: Charlynne Cousins, MD ?Primary Care Physician: [name], NP ?Admit Date: 12/21/2021 ? ?Reason for Consultation/Follow-up: Establishing goals of care ? ?Subjective: ?I saw and examined Kara Mullins today.  She was lying in bed in no distress. I met with her daughter who was at bedside, we discussed code status and broad goals of care, see below.  ?  ?Length of Stay: 3 ? ?Current Medications: ?Scheduled Meds:  ? sodium chloride   Intravenous Once  ? carvedilol  3.125 mg Oral BID WC  ? folic acid  1 mg Oral Daily  ? mouth rinse  15 mL Mouth Rinse BID  ? multivitamin with minerals  1 tablet Oral Daily  ? sodium chloride flush  3 mL Intravenous Q12H  ? ? ?Continuous Infusions: ? sodium chloride    ? heparin 850 Units/hr (12/25/21 0402)  ? ? ?PRN Meds: ?sodium chloride, acetaminophen, guaiFENesin-dextromethorphan, senna-docusate, sodium chloride flush ? ?Physical Exam         ?General: Alert, awake, in no acute distress.   ?HEENT: No bruits, no goiter, no JVD ?Heart: Regular rate and rhythm. No murmur appreciated. ?Lungs: Good air movement, clear ?Abdomen: Soft, nontender, nondistended, positive bowel sounds.   ?Ext: No significant edema ?Skin: Warm and dry ?Neuro: Grossly intact, nonfocal.  ? ?Vital Signs: BP (!) 146/91 (BP Location: Left Arm)   Pulse (!) 57   Temp 98.3 ?F (36.8 ?C) (Oral)   Resp 10   Ht 5' 5"  (1.651 m)   Wt 93.7 kg   SpO2 100%   BMI 34.38 kg/m?  ?SpO2: SpO2: 100 % ?O2 Device: O2 Device: Room Air ?O2 Flow Rate:   ? ?Intake/output summary:  ?Intake/Output Summary (Last 24 hours) at 12/25/2021 1347 ?Last data filed at 12/25/2021 0630 ?Gross per 24  hour  ?Intake 230.65 ml  ?Output 1200 ml  ?Net -969.35 ml  ? ? ?LBM: Last BM Date : 12/24/21 ?Baseline Weight: Weight: 93.7 kg ?Most recent weight: Weight: 93.7 kg ? ?     ?Palliative Assessment/Data: ? ? ? ?Flowsheet Rows   ? ?Flowsheet Row Most Recent Value  ?Intake Tab   ?Referral Department Hospitalist  ?Unit at Time  of Referral ICU  ?Palliative Care Primary Diagnosis Sepsis/Infectious Disease  ?Date Notified 12/22/21  ?Palliative Care Type Return patient Palliative Care  ?Reason for referral Clarify Goals of Care  ?Date of Admission 12/21/21  ?Date first seen by Palliative Care 12/22/21  ?# of days Palliative referral response time 0 Day(s)  ?# of days IP prior to Palliative referral 1  ?Clinical Assessment   ?Palliative Performance Scale Score 50%  ?Psychosocial & Spiritual Assessment   ?Palliative Care Outcomes   ?Patient/Family meeting held? Yes  ?Who was at the meeting? Patient, daughter  ?Palliative Care Outcomes Clarified goals of care  ? ?  ? ? ?Patient Active Problem List  ? Diagnosis Date Noted  ? Sepsis ([place]) 12/22/2021  ? COVID-19 virus infection 12/22/2021  ? Atrial fibrillation ([place]) 12/22/2021  ? CKD (chronic kidney disease) stage 5, GFR less than 15 ml/min (H[facility]) 12/22/2021  ? Anemia of chronic kidney failure, stage 5 ([place]) 12/22/2021  ? Prolonged QT interval 12/22/2021  ? Debility 11/20/2021  ? Metabolic acidosis 54/65/0354  ? Severe sepsis ([place]) 11/19/2021  ? Hypothermia 11/09/2021  ? Hyperlipidemia 11/09/2021  ? Chronic combined systolic and diastolic congestive heart failure ([place]) 11/09/2021  ? Stage 3b chronic kidney disease (CKD) ([place]) 11/09/2021  ? Pancytopenia ([place]) 11/09/2021  ? Severe protein-calorie malnutrition ([place]) 11/09/2021  ? Acute combined systolic and diastolic HF (heart failure) ([place])   ? AKI (acute kidney injury) ([place])   ? CHF exacerbation ([place]) 03/06/2021  ? Essential hypertension   ? Hypertensive urgency 03/05/2021  ? Acute CHF (congestive heart failure) ([place]) 03/05/2021  ?  Nausea and vomiting 03/05/2021  ? Elevated troponin level not due myocardial infarction 03/05/2021  ? Elevated d-dimer 03/05/2021  ? Generalized anxiety disorder 03/05/2021  ? Acute congestive heart failure ([place]) 03/05/2021  ? ? ?Palliative Care Assessment & Plan  ? ?Patient Profile: ?71 y.o. 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  She was brought to ED after her daughter worried she was getting septic again at home and was found to be COVID-positive with bilateral infiltrates on x-ray.  Started on remdesivir for COVID and heparin as she was also found to be in A-fib with RVR.  Palliative consulted for goals of care. ?  ? ?Recommendations/Plan: ?Full code/full scope: Kara Mullins continues to endorse wanting full code/full scope treatment. We discussed about DNR DNI in detail, in the presence of her daughter, patient again makes her wishes known that she desires full code, full scope treatment.  ?We talked about differences between hospice and palliative care. We talked about disposition options: home with home PT and home based palliative care has been chosen by patient and her daughter.  ?Appreciate chaplain assistance in completing HCPOA documents.   ? ?Goals of Care and Additional Recommendations: ?Limitations on Scope of Treatment: Full Scope Treatment ? ?Code Status: ? ?  ?Code Status Orders  ?(From admission, onward)  ?  ? ? ?  ? ?  Start     Ordered  ? 12/22/21 0514  Full code  Continuous       ? 12/22/21 0513  ? ?  ?  ? ?  ? ?Code Status History   ? ? Date Active Date Inactive Code Status Order ID Comments User Context  ? 11/17/2021 1741 11/24/2021 0051 DNR [id number]  Barb Merino, MD Inpatient  ? 11/09/2021 7001  11/17/2021 1741 Full Code [id number]  Reubin Milan, MD Inpatient  ? 03/05/2021 0735 03/15/2021 1931 Full  Code [id number]  Vernelle Emerald, MD ED  ? ?  ? ?Advance Directive Documentation   ? ?Flowsheet Row Most Recent Value  ?Type of Advance Directive Living will  ?Pre-existing out of facility DNR order (yellow form or pink MOST form) --  ?"MOST" Form in Place? --  ? ?  ? ? ?Prognosis: ? Unable to determine, but if her kidney function continues to worsen, prognosis will be days to weeks. Patient is aware of this possibility and knows that this is essentially an irreversible condition.  ? ?Discharge Planning: ?Home with daughter, home based PT and outpatient palliative support.  ? ?Care plan was discussed with patient, daughter and chaplain colleague.  ? ?Thank you for allowing the Palliative Medicine Team to assist in the care of this patient. ? ? ?Loistine Chance, MD ? ?Please contact Palliative Medicine Team phone at [phone] for questions and concerns.  ? ? ? ? ? ?

## 2021-12-25 NOTE — Progress Notes (Signed)
Chaplain is aware that Kara Mullins has paperwork for Advance Directives that she would like to notarize.  Due to the difficulty of getting witnesses for this with patient under precautions, chaplain will work on this tomorrow when there is more time to assemble everyone.  Chaplain spoke with pt's RN, Butch Penny, who affirmed that patient is not being discharged today. ? ?[employer], Bcc ?Pager, [phone] ?

## 2021-12-25 NOTE — Progress Notes (Addendum)
[facility] ?PROGRESS NOTE ? ? ? ?Progress Note  ?Kara Mullins  CHE:[id number] DOB: 1950/11/11 DOA: 12/21/2021 ?PCP: [name], [name], NP  ? ? ? ?Brief Narrative:  ? ?Kara Mullins is an 71 y.o. 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 Eventually from SNF was discharged home daughter, relates that the day prior to admission she had a low blood pressure and a variable heart rate she was concerned she might be getting septic again, brought  her in the emergency room was found to be hypothermic and bradycardic placed on a Bair hugger work-up revealed she was COVID-positive chest x-ray showed bilateral infiltratesd,was started on IV remdesivir and IV heparin infusion as she was found to be in A-fib with RVR ? ?Assessment/Plan:  ? ?Sepsis ([place]) probably due to COVID-19 pneumonia: ?Daughter relates she tested positive at the facility on 11/27/2021. ?We will continue treatment of IV remdesivir for total of 5 days ?And remained afebrile hypothermia  has resolved. ?She is currently on IV heparin for A-fib. ?Despite IV fluid hydration her creatinine continues to deteriorate, she is not a candidate for dialysis per renal hospitalization. ?Palliative care discussed with family transitioning to hospice. ?Patient relates that she wants to continues to be a full code and wants full aggressive treatment she does not seems to understand the gravity of the situation, daughter is helping make the mother understand that her condition will continue to deteriorate despite treatment. ? ?Thrombocytopenia: ?Likely due to sepsis, platelets have stabilized  continue to monitor closely. ? ?Hypothermia: ?Likely due to sepsis ?Resolved. ? ?Chronic atrial fibrillation: ?Continue Coreg and IV heparin. ?She is not a candidate for DOAC, I am not sure why she is started on Coumadin due to her worsening renal function, she is becoming anorexic ? ? ?Chronic kidney disease stage V: ?Not a candidate for dialysis ?Deemed not a good candidate for hemodialysis by nephrology during the past hospital stay for acute kidney injury. ?Renal function continues to deteriorate despite IV fluid hydration. ?PMT met with family and the patient, the patient endorses want to be a full code I  explained to her that aggressive interventions are not going to restore her to a meaningful level of function and that her renal function will continue to deteriorate, she will probably go into respiratory distress or uremic encephalopathy. ?KVO IV fluids as she might go into pulmonary edema ? ?Chronic combined systolic and diastolic heart failure: ?Last 2D echo showed an EF of 50% cardiology was concerned about cardiac amyloidosis as her work-up showed abnormal light chain she underwent bone marrow biopsy that showed plasmacytosis (concerned about amyloid disease). ?cardiac MRI on 04/30/2021 showed no evidence of cardiac amyloid ? ?Abnormal thyroid function: ?Will need to be recheck in 4 to 6 weeks, probably sick euthyroid syndrome ? ?Anemia of chronic renal disease: ?Drop in hemoglobin to 6.6 she is at as well as units of packed red blood cells her hemoglobin this morning is 8.1. ? ?Prolonged QTc: ?Keep the potassium greater than 4 magnesium greater than 2. ? ?Thrombocytopenia: ?Likely due to sepsis physiology, PT and INR unremarkable, will continue to follow ? ?Prolonged QT interval: ?Mildly prolonged corrected potassium greater  than 4 magnesium greater than 2. ?  ? ?Ethics: ?Extremely poor prognosis.  Family met with palliative care the patient has not emotional things which she is getting a discussion.   Palliative care to meet with family again either today or tomorrow. ?Patient wants to be full code ? ? ? ?DVT prophylaxis: heparin ?Family Communication:daughter ?Status is: Inpatient ?Remains inpatient appropriate because: Sepsis ? ? ? ?Code Status:  ? ?  ?Code Status Orders  ?(From admission, onward)  ?  ? ? ?  ? ?  Start     Ordered  ? 12/22/21 0514  Full code  Continuous       ? 12/22/21 0513  ? ?  ?  ? ?  ? ?Code Status History   ? ? Date Active Date Inactive Code Status Order ID Comments User Context  ? 11/17/2021 1741 11/24/2021 0051 DNR [id number]  Barb Merino, MD Inpatient  ? 11/09/2021 0734 11/17/2021 1741 Full Code [id number]  Reubin Milan, MD Inpatient  ? 03/05/2021 0735 03/15/2021 1931 Full Code [id number]  Vernelle Emerald, MD ED  ? ?  ? ? ? ? ?IV Access:  ? ?Peripheral IV ? ? ?Procedures and diagnostic studies:  ? ?No results found. ? ? ?Medical Consultants:  ? ?None. ? ? ?Subjective:  ? ? ?Kara Mullins has no new complaints ? ?Objective:  ? ? ?Vitals:  ? 12/24/21 0937 12/24/21 1441 12/24/21 2115 12/25/21 0629  ?BP: 132/86 (!) 172/95 (!) 163/83 (!) 160/85  ?Pulse: 67 62 (!) 58 (!) 59  ?Resp:  20 19 16   ?Temp:  98.2 ?F (36.8 ?C) 98.7 ?F (37.1 ?C) 98.3 ?F (36.8 ?C)  ?TempSrc:  Oral Oral Oral  ?SpO2:  100% 100% 100%  ?Weight:      ?Height:      ? ?SpO2: 100 % ? ? ?Intake/Output Summary (Last 24 hours) at 12/25/2021 0900 ?Last data filed at 12/25/2021 0630 ?Gross per 24 hour  ?Intake 395.81 ml  ?Output 1200 ml  ?Net -804.19 ml  ? ? ?Filed Weights  ? 12/23/21 1230  ?Weight: 93.7 kg  ? ? ?Exam: ?General exam: In no acute distress. ?Respiratory system: Good air movement and clear to auscultation. ?Cardiovascular system: S1 & S2 heard, RRR. No JVD. ?Gastrointestinal system: Abdomen is nondistended, soft and nontender.  ?Extremities: No pedal edema. ?Skin: No rashes, lesions or ulcers ?Psychiatry: Judgement and insight appear normal. Mood & affect appropriate. ?Data Reviewed:  ? ? ?Labs: ?Basic Metabolic  Panel: ?Recent Labs  ?Lab 12/19/21 ?1020 12/22/21 ?0010 12/23/21 ?0151 12/24/21 ?0330 12/25/21 ?0342  ?NA 147* 140 140 144 143  ?K 4.1 3.8 4.0 4.1 4.0  ?CL 117* 116* 117* 118* 117*  ?CO2 17* 18* 15* 15* 15*  ?GLUCOSE 55* 76 92 61* 74  ?BUN 57* 67* 76* 77* 79*  ?CREATININE 4.35* 4.77* 5.09* 5.30* 5.42*  ?CALCIUM 7.0* 6.6* 6.5* 6.5* 6.3*  ?MG 3.0*  --   --   --   --   ? ? ?GFR ?Estimated Creatinine Clearance: 11.1 mL/min (A) (by C-G formula based on SCr of 5.42 mg/dL (H)). ?Liver Function Tests: ?Recent Labs  ?Lab 12/19/21 ?1020 12/22/21 ?0010 12/23/21 ?0151 12/24/21 ?0330 12/25/21 ?0342  ?AST 33 35 29 28 27   ?ALT 17 17 15 13 14   ?ALKPHOS 168* 138* 108 100 105  ?BILITOT 0.3 0.2* 0.3 0.3 0.4  ?PROT 5.1* 5.7* 4.8* 4.8* 4.5*  ?ALBUMIN 2.2* 1.9* 1.6* 1.6* <1.5*  ? ? ?No results for input(s): LIPASE, AMYLASE in  the last 168 hours. ?No results for input(s): AMMONIA in the last 168 hours. ?Coagulation profile ?Recent Labs  ?Lab 12/22/21 ?0010  ?INR 1.1  ? ? ?COVID-19 Labs ? ?Recent Labs  ?  12/23/21 ?0151 12/23/21 ?0634 12/24/21 ?0330 12/25/21 ?0342  ?DDIMER 2.00*  --  1.99* 1.68*  ?CRP  --  0.5 4.0* 5.5*  ? ? ? ?Lab Results  ?Component Value Date  ? SARSCOV2NAA POSITIVE (A) 12/22/2021  ? [place] NEGATIVE 11/09/2021  ? [place] NEGATIVE 03/05/2021  ? ? ?CBC: ?Recent Labs  ?Lab 12/19/21 ?1020 12/22/21 ?0010 12/23/21 ?0151 12/24/21 ?0330 12/25/21 ?0342  ?WBC 4.0 4.0 4.8 5.4 5.8  ?NEUTROABS  --  3.0 3.6 3.8 3.9  ?HGB 8.1* 8.0* 6.6* 8.1* 8.4*  ?HCT 24.3* 25.2* 20.8* 25.4* 25.7*  ?MCV 81 86.6 86.3 85.2 84.5  ?PLT 64* 57* 58* 54* 51*  ? ? ?Cardiac Enzymes: ?No results for input(s): CKTOTAL, CKMB, CKMBINDEX, TROPONINI in the last 168 hours. ?BNP (last 3 results) ?No results for input(s): PROBNP in the last 8760 hours. ?CBG: ?Recent Labs  ?Lab 12/21/21 ?2359  ?GLUCAP 73  ? ? ?D-Dimer: ?Recent Labs  ?  12/24/21 ?0330 12/25/21 ?0342  ?DDIMER 1.99* 1.68*  ? ? ?Hgb A1c: ?No results for input(s): HGBA1C in the last 72  hours. ?Lipid Profile: ?No results for input(s): CHOL, HDL, LDLCALC, TRIG, CHOLHDL, LDLDIRECT in the last 72 hours. ?Thyroid function studies: ?No results for input(s): TSH, T4TOTAL, T3FREE, THYROIDAB in the last 72 hours.

## 2021-12-26 ENCOUNTER — Inpatient Hospital Stay: Payer: Medicare (Managed Care) | Admitting: Oncology

## 2021-12-26 DIAGNOSIS — N185 Chronic kidney disease, stage 5: Secondary | ICD-10-CM | POA: Diagnosis not present

## 2021-12-26 DIAGNOSIS — T68XXXA Hypothermia, initial encounter: Secondary | ICD-10-CM | POA: Diagnosis not present

## 2021-12-26 DIAGNOSIS — J1282 Pneumonia due to coronavirus disease 2019: Secondary | ICD-10-CM | POA: Diagnosis present

## 2021-12-26 DIAGNOSIS — I482 Chronic atrial fibrillation, unspecified: Secondary | ICD-10-CM | POA: Diagnosis not present

## 2021-12-26 DIAGNOSIS — I5042 Chronic combined systolic (congestive) and diastolic (congestive) heart failure: Secondary | ICD-10-CM | POA: Diagnosis not present

## 2021-12-26 LAB — COMPREHENSIVE METABOLIC PANEL
ALT: 14 U/L (ref 0–44)
AST: 27 U/L (ref 15–41)
Albumin: <1.5 g/dL — ABNORMAL LOW (ref 3.5–5.0)
Alkaline Phosphatase: 107 U/L (ref 38–126)
Anion gap: 10 (ref 5–15)
BUN: 81 mg/dL — ABNORMAL HIGH (ref 8–23)
CO2: 15 mmol/L — ABNORMAL LOW (ref 22–32)
Calcium: 6.5 mg/dL — ABNORMAL LOW (ref 8.9–10.3)
Chloride: 118 mmol/L — ABNORMAL HIGH (ref 98–111)
Creatinine, Ser: 4.55 mg/dL — ABNORMAL HIGH (ref 0.44–1.00)
GFR, Estimated: 10 mL/min — ABNORMAL LOW (ref >60)
Glucose, Bld: 86 mg/dL (ref 70–99)
Potassium: 4 mmol/L (ref 3.5–5.1)
Sodium: 143 mmol/L (ref 135–145)
Total Bilirubin: 0.3 mg/dL (ref 0.3–1.2)
Total Protein: 4.6 g/dL — ABNORMAL LOW (ref 6.5–8.1)

## 2021-12-26 LAB — CBC WITH DIFFERENTIAL/PLATELET
Abs Immature Granulocytes: 0.02 10*3/uL (ref 0.00–0.07)
Basophils Absolute: 0 10*3/uL (ref 0.0–0.1)
Basophils Relative: 0 %
Eosinophils Absolute: 0.1 10*3/uL (ref 0.0–0.5)
Eosinophils Relative: 2 %
HCT: 25.6 % — ABNORMAL LOW (ref 36.0–46.0)
Hemoglobin: 8.2 g/dL — ABNORMAL LOW (ref 12.0–15.0)
Immature Granulocytes: 0 %
Lymphocytes Relative: 20 %
Lymphs Abs: 1.1 10*3/uL (ref 0.7–4.0)
MCH: 27.5 pg (ref 26.0–34.0)
MCHC: 32 g/dL (ref 30.0–36.0)
MCV: 85.9 fL (ref 80.0–100.0)
Monocytes Absolute: 0.5 10*3/uL (ref 0.1–1.0)
Monocytes Relative: 9 %
Neutro Abs: 3.8 10*3/uL (ref 1.7–7.7)
Neutrophils Relative %: 69 %
Platelets: 48 10*3/uL — ABNORMAL LOW (ref 150–400)
RBC: 2.98 MIL/uL — ABNORMAL LOW (ref 3.87–5.11)
RDW: 19 % — ABNORMAL HIGH (ref 11.5–15.5)
WBC: 5.5 10*3/uL (ref 4.0–10.5)
nRBC: 0.4 % — ABNORMAL HIGH (ref 0.0–0.2)

## 2021-12-26 LAB — C-REACTIVE PROTEIN: CRP: 6.5 mg/dL — ABNORMAL HIGH (ref <1.0)

## 2021-12-26 LAB — HEPARIN LEVEL (UNFRACTIONATED): Heparin Unfractionated: 0.41 IU/mL (ref 0.30–0.70)

## 2021-12-26 LAB — D-DIMER, QUANTITATIVE: D-Dimer, Quant: 1.89 ug/mL-FEU — ABNORMAL HIGH (ref 0.00–0.50)

## 2021-12-26 MED ORDER — HYDRALAZINE HCL 20 MG/ML IJ SOLN: 5.0000 mg | Freq: Four times a day (QID) | INTRAMUSCULAR | Status: DC | PRN

## 2021-12-26 MED ORDER — AMLODIPINE BESYLATE 5 MG PO TABS
5.0000 mg | ORAL_TABLET | Freq: Every day | ORAL | Status: DC
  Administered 2021-12-26 – 2021-12-30 (×5): 5 mg via ORAL
  Filled 2021-12-26 (×5): qty 1

## 2021-12-26 MED ORDER — CALCIUM GLUCONATE-NACL 2-0.675 GM/100ML-% IV SOLN
2.0000 g | Freq: Once | INTRAVENOUS | Status: AC
  Administered 2021-12-26: 2000 mg via INTRAVENOUS
  Filled 2021-12-26: qty 100

## 2021-12-26 NOTE — Plan of Care (Signed)

## 2021-12-26 NOTE — Telephone Encounter (Signed)
Called patients daughter, Kara Mullins to confirm that the [place] paperwork was completed and faxed last week. Patients daughter understood.  ?

## 2021-12-26 NOTE — Progress Notes (Signed)
End of shift ? ?Spoke with Dr Sherral Hammers about pt's plan of care.  Anticipate d/c of heparin gtt, and PT consult.  Pt was 1 assist to the chair this afternoon.   ? ?Anticipate pt d/c tomorrow. ?

## 2021-12-26 NOTE — Progress Notes (Addendum)
?PROGRESS NOTE ? ? ? ?Kara Mullins  GNO:[id number] DOB: [birth year] DOA: 12/21/2021 ?PCP: [name], [name], NP  ? ?Brief Narrative:  ?Kara Mullins is an 71 y.o. 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 Eventually from SNF was discharged home daughter, relates that the day prior to admission she had a low blood pressure and a variable heart rate she was concerned she might be getting septic again, brought  her in the emergency room was found to be hypothermic and bradycardic placed on a Bear hugger work-up revealed she was COVID-positive chest x-ray showed bilateral infiltratesd,was started on IV remdesivir and IV heparin infusion as she was found to be in A-fib with RVR ? ? ?Subjective: ?Overnight hypothermic T-min 36.4 ?C.  A/O x4.  ? ? ? ?Assessment & Plan: ? Covid vaccination; ? ?Principal Problem: ?  Sepsis ([place]) ?Active Problems: ?  COVID-19 virus infection ?  Hypothermia ?  Atrial fibrillation ([place]) ?  CKD (chronic kidney disease) stage 5, GFR less than 15 ml/min (HCC) ?  Chronic combined systolic and diastolic congestive heart failure ([place]) ?  Anemia of chronic kidney failure, stage 5 (HCC) ?  Prolonged QT interval ?  Palliative care by specialist ?  Goals of care, counseling/discussion ?  General weakness ?  Pneumonia due to COVID-19 virus ? ? ?Sepsis/ COVID-19 pneumonia: ?-Daughter relates she tested positive at the facility on 11/27/2021. ?-Completed 2 days of Remdesivir then was discontinued.  Patient on room air inflammatory markers stable will not restart to complete 5-day course.   ?-Palliative care discussed with family transitioning to hospice. ?-Patient  relates that she wants to continues to be a full code and wants full aggressive treatment she does not seems to understand the gravity of the situation, daughter is helping make the mother understand that her condition will continue to deteriorate despite treatment. ? ?COVID-19 Labs ? ?Recent Labs  ?  12/24/21 ?0330 12/25/21 ?0342 12/26/21 ?0404  ?DDIMER 1.99* 1.68* 1.89*  ?CRP 4.0* 5.5* 6.5*  ? ? ?Lab Results  ?Component Value Date  ? SARSCOV2NAA POSITIVE (A) 12/22/2021  ? [place] NEGATIVE 11/09/2021  ? [place] NEGATIVE 03/05/2021  ?  ?  ?Hypothermia: ?-Likely due to sepsis ?-Resolved. ? ?Chronic combined systolic and diastolic CHF ?- Last 2D echo showed an EF of 50% cardiology was concerned about cardiac amyloidosis as her work-up showed abnormal light chain she underwent bone marrow biopsy that showed plasmacytosis (concerned about amyloid disease). ?-04/30/2021 Cardiac MRI showed no evidence of cardiac amyloid ?-Strict in and out ?- Daily weight ?- Coreg 3.125 mg BID ?- 3/15 Amlodipine 5 mg daily ?-3/15 Hydralazine PRN ? ?Chronic atrial fibrillation: ?- See CHF ?- After lengthy discussion with pharmacy patient a poor candidate for Lovenox and DOAC (could be discharged on apixaban 5 mg daily), given her poor creatinine clearance, thrombocytopenia. ? ?Prolonged QT interval: ?-Mildly prolonged corrected.   ? ?Essential HTN ?- See CHF ?  ?  ?Chronic kidney disease stage V: ?-Not a candidate for dialysis.  Per patient her nephrologist was the one who informed her that she is not a candidate for HD. ?-Renal function continues to deteriorate despite IV fluid hydration. ?- 3/15 the patient  endorses want to be a full code I  explained to her that aggressive interventions are not going to restore her to a meaningful level of function and that her renal function will continue to deteriorate, she will probably go into respiratory distress or uremic encephalopathy. ?-KVO IV fluids as she might go into pulmonary  edema ?Lab Results  ?Component Value Date  ? CREATININE 4.55 (H) 12/26/2021  ? CREATININE 5.42 (H) 12/25/2021  ? CREATININE 5.30 (H) 12/24/2021  ? CREATININE 5.09 (H) 12/23/2021  ? CREATININE 4.77 (H) 12/22/2021  ? ?  ?Abnormal thyroid function: ?- 3/11 TSH= 7.6 ?-3/15 Free T4 pending  ?-Sick euthyroid syndrome? ? ?Anemia of chronic renal disease: ?-Drop in hemoglobin to 6.6 most likely lab error. ?- 3/12 transfuse 2 units PRBC  ?-3/15 Anemia panel pending ?Lab Results  ?Component Value Date  ? HGB 8.2 (L) 12/26/2021  ? HGB 8.4 (L) 12/25/2021  ? HGB 8.1 (L) 12/24/2021  ? HGB 6.6 (LL) 12/23/2021  ? HGB 8.0 (L) 12/22/2021  ? ?Thrombocytopenia: ?-Likely due to sepsis physiology, PT and INR unremarkable, will continue to follow ? ? ?Hypocalcemia ?-3/15 corrected calcium= 8.5 ?- 3/15 calcium gluconate 2 g ? ?Goals of care ?- 3/15 PT/OT consult: Multisystem organ failure, deconditioned, evaluate patient for CIR vs SNF ?  ?  ?Ethics: ?Extremely poor prognosis.  Family met with palliative care the patient has not emotional things which she is getting a discussion.  Palliative care to meet with family again either today or tomorrow. ?Patient wants to be full code ? ? ? ?DVT prophylaxis: None given patient's creatinine clearance, MMP. ?Code Status: Full ?Family Communication:  ?Status is: Inpatient ? ? ? ?Dispo: The patient is from: Home ?             Anticipated d/c is to: SNF ?             Anticipated d/c date is: 3 days ?             Patient currently is medically stable to d/c. ? ? ? ? ? ?Consultants:  ?Palliative care ? ? ?Procedures/Significant Events:  ?3/12 transfuse 2 units PRBC  ? ? ? ? ?I have personally reviewed and interpreted all radiology studies and my findings are as above. ? ?VENTILATOR SETTINGS: ? ? ? ?Cultures ?3/11 influenza A/B negative ?3/11 SARS coronavirus positive ? ? ?Antimicrobials: ?Anti-infectives (From admission, onward)  ? ? Start     Ordered Stop  ? 12/23/21 1000  remdesivir 100 mg in sodium  chloride 0.9 % 100 mL IVPB       ?See Hyperspace for full Linked Orders Report.  ? 12/22/21 0322 12/24/21 1023  ? 12/22/21 0430  remdesivir 100 mg in sodium chloride 0.9 % 100 mL IVPB       ?See Hyperspace for full Linked Orders Report.  ? 12/22/21 0322 12/22/21 0439  ? 12/22/21 0400  remdesivir 100 mg in sodium chloride 0.9 % 100 mL IVPB       ?See Hyperspace for full Linked Orders Report.  ? 12/22/21 0322 12/22/21 0439  ? 12/22/21 0045  vancomycin (VANCOCIN) IVPB 1000 mg/200 mL premix       ? 12/22/21 0033 12/22/21 0143  ? 12/22/21 0045  ceFEPIme (MAXIPIME) 2 g in sodium chloride 0.9 % 100 mL IVPB       ? 12/22/21 0033 12/22/21 0106  ? ?  ?  ? ? ?Devices ?  ? ?LINES / TUBES:  ? ? ? ? ?Continuous  Infusions: ? sodium chloride 250 mL (12/26/21 0746)  ? heparin 850 Units/hr (12/26/21 0120)  ? ? ? ?Objective: ?Vitals:  ? 12/26/21 0617 12/26/21 1242 12/26/21 1741 12/26/21 1748  ?BP: (!) 167/98 (!) 165/84  (!) 185/107  ?Pulse: (!) 56 (!) 57  (!) 58  ?Resp: 16 18 13    ?Temp: (!) 97.5 ?F (36.4 ?C) 97.7 ?F (36.5 ?C)    ?TempSrc: Oral Oral    ?SpO2: 100% 100%    ?Weight:      ?Height:      ? ? ?Intake/Output Summary (Last 24 hours) at 12/26/2021 1828 ?Last data filed at 12/26/2021 1733 ?Gross per 24 hour  ?Intake 482.7 ml  ?Output 1350 ml  ?Net -867.3 ml  ? ?Filed Weights  ? 12/23/21 1230  ?Weight: 93.7 kg  ? ? ?Examination: ? ?General: A/O x4, No acute respiratory distress ?Eyes: negative scleral hemorrhage, negative anisocoria, negative icterus ?ENT: Negative Runny nose, negative gingival bleeding, ?Neck:  Negative scars, masses, torticollis, lymphadenopathy, JVD ?Lungs: Clear to auscultation bilaterally without wheezes or crackles ?Cardiovascular: Regular rate and rhythm without murmur gallop or rub normal S1 and S2 ?Abdomen: negative abdominal pain, nondistended, positive soft, bowel sounds, no rebound, no ascites, no appreciable mass ?Extremities: No significant cyanosis, clubbing, or edema bilateral lower  extremities ?Skin: Negative rashes, lesions, ulcers ?Psychiatric:  Negative depression, negative anxiety, negative fatigue, negative mania  ?Central nervous system:  Cranial nerves II through XII intact, tongue/uvula midline, a

## 2021-12-26 NOTE — Progress Notes (Signed)
ANTICOAGULATION CONSULT NOTE ? ?Pharmacy Consult for heparin ?Indication: atrial fibrillation ? ?Allergies  ?Allergen Reactions  ? Chlorhexidine Other (See Comments)  ?  Unknown reaction  ? ? ?Patient Measurements: ?Height: 5\' 5"  (165.1 cm) ?Weight: 93.7 kg (206 lb 9.1 oz) ?IBW/kg (Calculated) : 57 ?Heparin Dosing Weight: 85.3kg ? ?Vital Signs: ?Temp: 97.5 ?F (36.4 ?C) (03/15 0617) ?Temp Source: Oral (03/15 0617) ?BP: 167/98 (03/15 0617) ?Pulse Rate: 56 (03/15 0617) ? ?Labs: ?Recent Labs  ?  12/24/21 ?0330 12/25/21 ?0342 12/26/21 ?0404  ?HGB 8.1* 8.4* 8.2*  ?HCT 25.4* 25.7* 25.6*  ?PLT 54* 51* 48*  ?HEPARINUNFRC 0.40 0.40 0.41  ?CREATININE 5.30* 5.42* 4.55*  ? ? ? ?Estimated Creatinine Clearance: 13.2 mL/min (A) (by C-G formula based on SCr of 4.55 mg/dL (H)). ? ? ?Medical History: ?Past Medical History:  ?Diagnosis Date  ? Chronic combined systolic and diastolic congestive heart failure ([place]) 11/09/2021  ? Generalized anxiety disorder   ? Hyperlipidemia 11/09/2021  ? Hypertension   ? Stage 3b chronic kidney disease (CKD) ([place]) 11/09/2021  ? ? ?Assessment: ?71 yo pt with new onset afib, pharmacy consulted to dose heparin.  History of CKD stage V and anemia of chronic disease.  No prior AC noted.  Thrombocytopenia noted to be likely due to sepsis. ? ?Today, 12/26/21 ?Heparin level continues to be therapeutic at current IV heparin rate of 850 units/hr ?Hgb improved s/p blood transfusion on 3/12, plt continues to be low at 48 (plt 64 on 3/8), Scr 4.55 down a little ?No bleeding reported by RN and no issues with infusion ? ? ?Goal of Therapy:  ?Heparin level 0.3-0.7 units/ml ?Monitor platelets by anticoagulation protocol: Yes ?  ?Plan:  ?Continue IV heparin at 850 units/hr ?Monitor daily heparin level, CBC, signs/symptoms of bleeding ?What is plan for ultimate plan for anticoagulation? Discontinue, etc? ? ? ?Adrian Saran, PharmD, BCPS ?Secure Chat if ?s ?12/26/2021 11:15 AM ? ? ?

## 2021-12-26 NOTE — Progress Notes (Signed)
Chaplain attempted to follow up with Kara Mullins regarding her Advance Directives.  [name] was not alert and family was not present.  We will attempt at a later time. ? ?[employer], Bcc ?Pager, [phone] ?4:50 PM ? ?

## 2021-12-27 DIAGNOSIS — N185 Chronic kidney disease, stage 5: Secondary | ICD-10-CM | POA: Diagnosis not present

## 2021-12-27 DIAGNOSIS — R531 Weakness: Secondary | ICD-10-CM | POA: Diagnosis not present

## 2021-12-27 DIAGNOSIS — I482 Chronic atrial fibrillation, unspecified: Secondary | ICD-10-CM | POA: Diagnosis not present

## 2021-12-27 DIAGNOSIS — I5042 Chronic combined systolic (congestive) and diastolic (congestive) heart failure: Secondary | ICD-10-CM | POA: Diagnosis not present

## 2021-12-27 DIAGNOSIS — Z7189 Other specified counseling: Secondary | ICD-10-CM | POA: Diagnosis not present

## 2021-12-27 DIAGNOSIS — T68XXXA Hypothermia, initial encounter: Secondary | ICD-10-CM | POA: Diagnosis not present

## 2021-12-27 LAB — SEDIMENTATION RATE: Sed Rate: 90 mm/hr — ABNORMAL HIGH (ref 0–22)

## 2021-12-27 LAB — COMPREHENSIVE METABOLIC PANEL WITH GFR
ALT: 12 U/L (ref 0–44)
AST: 24 U/L (ref 15–41)
Albumin: <1.5 g/dL — ABNORMAL LOW (ref 3.5–5.0)
Alkaline Phosphatase: 97 U/L (ref 38–126)
Anion gap: 8 (ref 5–15)
BUN: 84 mg/dL — ABNORMAL HIGH (ref 8–23)
CO2: 14 mmol/L — ABNORMAL LOW (ref 22–32)
Calcium: 6.8 mg/dL — ABNORMAL LOW (ref 8.9–10.3)
Chloride: 119 mmol/L — ABNORMAL HIGH (ref 98–111)
Creatinine, Ser: 5.36 mg/dL — ABNORMAL HIGH (ref 0.44–1.00)
GFR, Estimated: 8 mL/min — ABNORMAL LOW
Glucose, Bld: 81 mg/dL (ref 70–99)
Potassium: 3.9 mmol/L (ref 3.5–5.1)
Sodium: 141 mmol/L (ref 135–145)
Total Bilirubin: 0.3 mg/dL (ref 0.3–1.2)
Total Protein: 4.5 g/dL — ABNORMAL LOW (ref 6.5–8.1)

## 2021-12-27 LAB — CBC WITH DIFFERENTIAL/PLATELET
Abs Immature Granulocytes: 0.01 10*3/uL (ref 0.00–0.07)
Basophils Absolute: 0 10*3/uL (ref 0.0–0.1)
Basophils Relative: 0 %
Eosinophils Absolute: 0.1 10*3/uL (ref 0.0–0.5)
Eosinophils Relative: 3 %
HCT: 25.4 % — ABNORMAL LOW (ref 36.0–46.0)
Hemoglobin: 8 g/dL — ABNORMAL LOW (ref 12.0–15.0)
Immature Granulocytes: 0 %
Lymphocytes Relative: 21 %
Lymphs Abs: 0.9 10*3/uL (ref 0.7–4.0)
MCH: 27.4 pg (ref 26.0–34.0)
MCHC: 31.5 g/dL (ref 30.0–36.0)
MCV: 87 fL (ref 80.0–100.0)
Monocytes Absolute: 0.4 10*3/uL (ref 0.1–1.0)
Monocytes Relative: 10 %
Neutro Abs: 3 10*3/uL (ref 1.7–7.7)
Neutrophils Relative %: 66 %
Platelets: 44 10*3/uL — ABNORMAL LOW (ref 150–400)
RBC: 2.92 MIL/uL — ABNORMAL LOW (ref 3.87–5.11)
RDW: 18.9 % — ABNORMAL HIGH (ref 11.5–15.5)
WBC: 4.4 10*3/uL (ref 4.0–10.5)
nRBC: 0 % (ref 0.0–0.2)

## 2021-12-27 LAB — IRON AND TIBC
Iron: 53 ug/dL (ref 28–170)
Saturation Ratios: 51 % — ABNORMAL HIGH (ref 10.4–31.8)
TIBC: 104 ug/dL — ABNORMAL LOW (ref 250–450)
UIBC: 51 ug/dL

## 2021-12-27 LAB — RETICULOCYTES
Immature Retic Fract: 31.3 % — ABNORMAL HIGH (ref 2.3–15.9)
RBC.: 2.94 MIL/uL — ABNORMAL LOW (ref 3.87–5.11)
Retic Count, Absolute: 43.2 10*3/uL (ref 19.0–186.0)
Retic Ct Pct: 1.5 % (ref 0.4–3.1)

## 2021-12-27 LAB — CULTURE, BLOOD (ROUTINE X 2)
Culture: NO GROWTH
Culture: NO GROWTH

## 2021-12-27 LAB — C-REACTIVE PROTEIN: CRP: 6.6 mg/dL — ABNORMAL HIGH (ref <1.0)

## 2021-12-27 LAB — PHOSPHORUS: Phosphorus: 9.4 mg/dL — ABNORMAL HIGH (ref 2.5–4.6)

## 2021-12-27 LAB — D-DIMER, QUANTITATIVE: D-Dimer, Quant: 1.74 ug/mL-FEU — ABNORMAL HIGH (ref 0.00–0.50)

## 2021-12-27 LAB — VITAMIN B12: Vitamin B-12: 725 pg/mL (ref 180–914)

## 2021-12-27 LAB — FERRITIN
Ferritin: 349 ng/mL — ABNORMAL HIGH (ref 11–307)
Ferritin: 618 ng/mL — ABNORMAL HIGH (ref 11–307)

## 2021-12-27 LAB — FOLATE: Folate: 18.6 ng/mL

## 2021-12-27 LAB — MAGNESIUM: Magnesium: 2.8 mg/dL — ABNORMAL HIGH (ref 1.7–2.4)

## 2021-12-27 LAB — T4, FREE: Free T4: 0.65 ng/dL (ref 0.61–1.12)

## 2021-12-27 NOTE — Evaluation (Signed)
Occupational Therapy Evaluation ?Patient Details ?Name: Kara Mullins ?MRN: [id number] ?DOB: April 26, 1951 ?Today's Date: 12/27/2021 ? ? ?History of Present Illness 71 y.o. female with medical history significant of chronic combined systolic and diastolic heart failure, generalized anxiety disorder, hyperlipidemia, hypertension, stage IIIb CKD who was brought to the emergency department due to inability to swallow less than an hour before arrival. While in ED, pt had episode of syncope, found to be hypothermic and evidence suggesting UTI. Dx of sepsis.  ? ?Clinical Impression ?  ?Mrs. Zuriel Yeaman is a 71 year old woman who presents with above medical history. On evaluation she presents with generalized weakness, decreased activity tolerance, impaired balance and severe edema in lower extremities and edema in RUE. She reports recently discharging from SNF and not being satisfied with amount of therapy and decreased independence with ADLs and mobility.  ON evaluation she was mod assist x 2 to stand but only min assist to ambulate short distance in room with a chair follow. She required max-total assist for LB ADLs and toileting and setup assistance for UB ADLs. Today she reports concerns over being a burden to her daughter and wanting to be more independent and aggressive with therapy. Patient agreeable to CIR if available but not SNF. Patient will benefit from skilled OT services while in hospital to improve deficits and learn compensatory strategies as needed in order to improve functional abilities. If CIR not an option and patient continues to pursue aggressive measures would recommend [facility] OT and 24/7 assistance.  ?   ? ?Recommendations for follow up therapy are one component of a multi-disciplinary discharge planning process, led by the attending physician.  Recommendations may be updated based on patient status, additional functional criteria and insurance authorization.  ? ?Follow Up Recommendations ? Acute  inpatient rehab (3hours/day)  ?  ?Assistance Recommended at Discharge Frequent or constant Supervision/Assistance  ?Patient can return home with the following A lot of help with bathing/dressing/bathroom;A little help with walking and/or transfers;Assistance with cooking/housework;Help with stairs or ramp for entrance;Assist for transportation ? ?  ?Functional Status Assessment ? Patient has had a recent decline in their functional status and demonstrates the ability to make significant improvements in function in a reasonable and predictable amount of time.  ?Equipment Recommendations ? None recommended by OT  ?  ?Recommendations for Other Services   ? ? ?  ?Precautions / Restrictions Precautions ?Precautions: Fall ?Restrictions ?Weight Bearing Restrictions: No  ? ?  ? ?   ?Balance Overall balance assessment: Needs assistance ?Sitting-balance support: Feet supported, No upper extremity supported ?Sitting balance-Leahy Scale: Good ?  ?  ?Standing balance support: During functional activity, Reliant on assistive device for balance ?Standing balance-Leahy Scale: Poor ?  ?  ?  ?  ?  ?  ?  ?  ?  ?  ?  ?  ?   ? ?ADL either performed or assessed with clinical judgement  ? ?ADL Overall ADL's : Needs assistance/impaired ?Eating/Feeding: Set up;Sitting ?  ?Grooming: Set up;Sitting ?  ?Upper Body Bathing: Set up;Sitting ?  ?Lower Body Bathing: Maximal assistance;Sit to/from stand ?  ?Upper Body Dressing : Set up;Sitting ?  ?Lower Body Dressing: Total assistance;Sit to/from stand ?  ?Toilet Transfer: Minimal assistance;BSC/3in1;Rolling walker (2 wheels) ?  ?Toileting- Clothing Manipulation and Hygiene: Maximal assistance;Sitting/lateral lean ?  ?  ?  ?Functional mobility during ADLs: +2 for safety/equipment;Minimal assistance ?General ADL Comments: Mod x 2 to power up but min assist with RW and chair follow to ambulate  short distance in room. She is significantly limited by LE edema.  ? ? ? ?Vision Patient Visual Report: No  change from baseline ?   ?   ?Perception   ?  ?Praxis   ?  ? ?Pertinent Vitals/Pain Pain Assessment ?Pain Assessment: No/denies pain  ? ? ? ?Hand Dominance   ?  ?Extremity/Trunk Assessment Upper Extremity Assessment ?Upper Extremity Assessment: RUE deficits/detail;LUE deficits/detail ?RUE Deficits / Details: Grossly functional ROM, grossly 4-/5 strength, RUE edematous ?RUE Sensation: WNL ?RUE Coordination: WNL ?LUE Deficits / Details: WFL ROM, grossly 4-/5 strength ?LUE Sensation: WNL ?LUE Coordination: WNL ?  ?Lower Extremity Assessment ?Lower Extremity Assessment: Defer to PT evaluation ?  ?Cervical / Trunk Assessment ?Cervical / Trunk Assessment: Kyphotic ?  ?Communication Communication ?Communication: No difficulties ?  ?Cognition Arousal/Alertness: Awake/alert ?Behavior During Therapy: [facility] for tasks assessed/performed ?Overall Cognitive Status: Within Functional Limits for tasks assessed ?  ?  ?  ?  ?  ?  ?  ?  ?  ?  ?  ?  ?  ?  ?  ?  ?  ?  ?  ?General Comments    ? ?  ?Exercises   ?  ?Shoulder Instructions    ? ? ?Home Living Family/patient expects to be discharged to:: Private residence ?Living Arrangements: Children ?Available Help at Discharge: Family;Available PRN/intermittently ?Type of Home: House ?Home Access: Ramped entrance ?Entrance Stairs-Number of Steps: 2 ?  ?Home Layout: One level ?  ?  ?  ?  ?Bathroom Toilet: Handicapped height ?  ?  ?Home Equipment: Rollator (4 wheels) ?  ?Additional Comments: was sponge bathing, lives with daughter who works ?  ? ?  ?Prior Functioning/Environment Prior Level of Function : Needs assist ?  ?  ?  ?  ?  ?  ?Mobility Comments: uses rollator ?ADLs Comments: assistance with LB ADLs ?  ? ?  ?  ?OT Problem List: Decreased strength;Decreased activity tolerance;Cardiopulmonary status limiting activity;Obesity;Increased edema;Impaired balance (sitting and/or standing);Decreased knowledge of use of DME or AE ?  ?   ?OT Treatment/Interventions: Self-care/ADL  training;Therapeutic exercise;DME and/or AE instruction;Therapeutic activities;Balance training;Patient/family education  ?  ?OT Goals(Current goals can be found in the care plan section) Acute Rehab OT Goals ?Patient Stated Goal: to get stronger and get fluids off of legs ?OT Goal Formulation: With patient ?Time For Goal Achievement: 01/10/22 ?Potential to Achieve Goals: Fair  ?OT Frequency: Min 2X/week ?  ? ?Co-evaluation   ?  ?  ?  ?  ? ?  ?AM-PAC OT "6 Clicks" Daily Activity     ?Outcome Measure Help from another person eating meals?: A Little ?Help from another person taking care of personal grooming?: A Little ?Help from another person toileting, which includes using toliet, bedpan, or urinal?: A Lot ?Help from another person bathing (including washing, rinsing, drying)?: A Lot ?Help from another person to put on and taking off regular upper body clothing?: A Little ?Help from another person to put on and taking off regular lower body clothing?: Total ?6 Click Score: 14 ?  ?End of Session Equipment Utilized During Treatment: Rolling walker (2 wheels);Gait belt ?Nurse Communication: Mobility status ? ?Activity Tolerance: Patient tolerated treatment well ?Patient left: in chair;with call bell/phone within reach;with chair alarm set ? ?OT Visit Diagnosis: Muscle weakness (generalized) (M62.81)  ?              ?Time: 5465-0354 ?OT Time Calculation (min): 26 min ?Charges:  OT General Charges ?$OT Visit: 1 Visit ?OT  Evaluation ?$OT Eval Low Complexity: 1 Low ? ?Jdyn Parkerson, OTR/L ?Acute Care Rehab Services  ?Office [phone] ?Pager: [phone]  ? ?Anaira Seay L Keyshon Stein ?12/27/2021, 3:40 PM ?

## 2021-12-27 NOTE — Progress Notes (Signed)
? ?                                                                                                                                                     ?                                                   ?Daily Progress Note  ? ?Patient Name: Kara Mullins       Date: 12/27/2021 ?DOB: 11-08-1950  Age: 71 y.o. MRN#: [id number] ?Attending Physician: Allie Bossier, MD ?Primary Care Physician: [name], NP ?Admit Date: 12/21/2021 ? ?Reason for Consultation/Follow-up: Establishing goals of care ? ?Subjective: ? Awake alert, no pain, call placed and discussed with daughter, also discussed with [facility] MD.  ?  ?Length of Stay: 5 ? ?Current Medications: ?Scheduled Meds:  ? sodium chloride   Intravenous Once  ? amLODipine  5 mg Oral Daily  ? carvedilol  3.125 mg Oral BID WC  ? folic acid  1 mg Oral Daily  ? mouth rinse  15 mL Mouth Rinse BID  ? multivitamin with minerals  1 tablet Oral Daily  ? sodium chloride flush  3 mL Intravenous Q12H  ? ? ?Continuous Infusions: ? sodium chloride 250 mL (12/26/21 0938)  ? ? ?PRN Meds: ?sodium chloride, acetaminophen, guaiFENesin-dextromethorphan, hydrALAZINE, senna-docusate, sodium chloride flush ? ?Physical Exam         ?General: Alert, awake, in no acute distress.   ?HEENT: No bruits, no goiter, no JVD ?Heart: Regular rate and rhythm. No murmur appreciated. ?Lungs: Good air movement, clear ?Abdomen: Soft, nontender, nondistended, positive bowel sounds.   ?Ext: No significant edema ?Skin: Warm and dry ?Neuro: Grossly intact, nonfocal.  ? ?Vital Signs: BP 132/64 (BP Location: Right Arm)   Pulse 64   Temp 98.5 ?F (36.9 ?C) (Oral)   Resp 15   Ht 5' 5"  (1.651 m)   Wt 94.3 kg   SpO2 100%   BMI 34.61 kg/m?  ?SpO2: SpO2: 100 % ?O2 Device: O2 Device: Room Air ?O2 Flow Rate:   ? ?Intake/output summary:  ?Intake/Output Summary (Last 24 hours) at 12/27/2021 1426 ?Last data filed at 12/27/2021 1000 ?Gross per 24 hour  ?Intake 547.97 ml  ?Output 700 ml  ?Net -152.03 ml  ? ? ?LBM: Last BM  Date : 12/26/21 ?Baseline Weight: Weight: 93.7 kg ?Most recent weight: Weight: 94.3 kg ? ?     ?Palliative Assessment/Data: ? ? ? ?Flowsheet Rows   ? ?Flowsheet Row Most Recent Value  ?Intake Tab   ?Referral Department Hospitalist  ?Unit at Time of Referral ICU  ?Palliative Care Primary Diagnosis Sepsis/Infectious Disease  ?Date Notified 12/22/21  ?Palliative Care Type  Return patient Palliative Care  ?Reason for referral Clarify Goals of Care  ?Date of Admission 12/21/21  ?Date first seen by Palliative Care 12/22/21  ?# of days Palliative referral response time 0 Day(s)  ?# of days IP prior to Palliative referral 1  ?Clinical Assessment   ?Palliative Performance Scale Score 50%  ?Psychosocial & Spiritual Assessment   ?Palliative Care Outcomes   ?Patient/Family meeting held? Yes  ?Who was at the meeting? Patient, daughter  ?Palliative Care Outcomes Clarified goals of care  ? ?  ? ? ?Patient Active Problem List  ? Diagnosis Date Noted  ? Pneumonia due to COVID-19 virus 12/26/2021  ? Palliative care by specialist   ? Goals of care, counseling/discussion   ? General weakness   ? Sepsis ([place]) 12/22/2021  ? COVID-19 virus infection 12/22/2021  ? Atrial fibrillation ([place]) 12/22/2021  ? CKD (chronic kidney disease) stage 5, GFR less than 15 ml/min (H[facility]) 12/22/2021  ? Anemia of chronic kidney failure, stage 5 ([place]) 12/22/2021  ? Prolonged QT interval 12/22/2021  ? Debility 11/20/2021  ? Metabolic acidosis 42/39/5320  ? Severe sepsis ([place]) 11/19/2021  ? Hypothermia 11/09/2021  ? Hyperlipidemia 11/09/2021  ? Chronic combined systolic and diastolic congestive heart failure ([place]) 11/09/2021  ? Stage 3b chronic kidney disease (CKD) ([place]) 11/09/2021  ? Pancytopenia ([place]) 11/09/2021  ? Severe protein-calorie malnutrition ([place]) 11/09/2021  ? Acute combined systolic and diastolic HF (heart failure) ([place])   ? AKI (acute kidney injury) ([place])   ? CHF exacerbation ([place]) 03/06/2021  ? Essential hypertension   ? Hypertensive urgency  03/05/2021  ? Acute CHF (congestive heart failure) ([place]) 03/05/2021  ? Nausea and vomiting 03/05/2021  ? Elevated troponin level not due myocardial infarction 03/05/2021  ? Elevated d-dimer 03/05/2021  ? Generalized anxiety disorder 03/05/2021  ? Acute congestive heart failure ([place]) 03/05/2021  ? ? ?Palliative Care Assessment & Plan  ? ?Patient Profile: ?71 y.o. 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  She was brought to ED after her daughter worried she was getting septic again at home and was found to be COVID-positive with bilateral infiltrates on x-ray.  Started on remdesivir for COVID and heparin as she was also found to be in A-fib with RVR.  Palliative consulted for goals of care. ?  ? ?Recommendations/Plan: ?Full code/full scope: as per patient's wishes, she is aware of the serious and irreversible nature of her overall conditions.  ?SNF rehab with palliative   ?Goals of Care and Additional Recommendations: ?Limitations on Scope of Treatment: Full Scope Treatment ? ?Code Status: ? ?  ?Code Status Orders  ?(From admission, onward)  ?  ? ? ?  ? ?  Start     Ordered  ? 12/22/21 0514  Full code  Continuous       ? 12/22/21 0513  ? ?  ?  ? ?  ? ?Code Status History   ? ? Date Active Date Inactive Code Status Order ID Comments User Context  ? 11/17/2021 1741 11/24/2021 0051 DNR [id number]  Barb Merino, MD Inpatient  ? 11/09/2021 0734 11/17/2021 1741 Full Code [id number]  Reubin Milan, MD Inpatient  ? 03/05/2021 0735 03/15/2021 1931 Full Code [id number]  Vernelle Emerald, MD ED  ? ?  ? ?Advance Directive Documentation   ? ?Flowsheet Row Most Recent Value  ?Type of Advance Directive  Living will  ?Pre-existing out of facility DNR order (yellow form or pink MOST form) --  ?"MOST" Form in Place? --  ? ?  ? ? ?Prognosis: ? Unable  to determine, but if her kidney function continues to worsen, prognosis will be days to weeks. Patient is aware of this possibility and knows that this is essentially an irreversible condition.  ? ?Discharge Planning: ?SNF with palliative.  ?Care plan was discussed with patient, daughter and [facility] MD.   ? ?Thank you for allowing the Palliative Medicine Team to assist in the care of this patient. ? ? ?Loistine Chance, MD ? ?Please contact Palliative Medicine Team phone at [phone] for questions and concerns.  ? ? ? ? ? ?

## 2021-12-27 NOTE — Progress Notes (Signed)
Inpatient Rehabilitation Admissions Coordinator  ? ?Inpatient rehab prescreen received. Noted COVID + 12/22/2021. Patients are eligible to be considered for admit to the [facility] when cleared from airborne precautions by acute MD or Infectious disease regardless of onset day.  Noted recent discharge to [facility] on 2/10 and eventual discharge home with daughter.Palliative involved as was last admission. Today's note with plans for SNF with palliative. Likely more appropriate SNF at this time. I will not pursue CIR admit.Please call me with any questions.  ?  ?Danne Baxter, RN, MSN ?Rehab Admissions Coordinator ?[phone] ?12/27/2021 4:23 PM ?  ?

## 2021-12-27 NOTE — Evaluation (Signed)
Physical Therapy Evaluation ?Patient Details ?Name: Kara Mullins ?MRN: [id number] ?DOB: 1951-04-05 ?Today's Date: 12/27/2021 ? ?History of Present Illness ? 71 y.o. female with medical history significant of chronic combined systolic and diastolic heart failure, generalized anxiety disorder, hyperlipidemia, hypertension, stage IIIb CKD who was brought to the emergency department due to inability to swallow less than an hour before arrival. While in ED, pt had episode of syncope, found to be hypothermic and evidence suggesting UTI. Dx of sepsis.  ?Clinical Impression ? [name] is a 71 year old woman who presents with above medical history. On evaluation she presents with generalized weakness, decreased activity tolerance, impaired balance and severe edema in lower extremities and edema in RUE. She reports recently discharging from SNF and not being satisfied with amount of therapy and decreased independence with ADLs and mobility.  ON evaluation she was mod assist x 2 to stand but only min assist to ambulate short distance in room with a chair follow.  Today she reports concerns over being a burden to her daughter and wanting to be more independent and aggressive with therapy. Patient agreeable to AIR if available but not SNF. Patient will benefit from skilled PT services while in hospital to improve deficits toward goal of maximizing IND and safety.  If CIR not an option and patient continues to pursue aggressive measures would recommend [facility] PT and 24/7 assistance ?   ? ? one component of a multi-disciplinary discharge planning process, led by the attending physician.  Recommendations may be updated based on patient status, additional functional criteria and insurance authorization. ? ?Follow Up Recommendations Acute inpatient rehab (3hours/day) ? ?  ?Assistance Recommended at Discharge Frequent or constant Supervision/Assistance  ?Patient can return home with the following ? A lot of help with walking  and/or transfers;A little help with bathing/dressing/bathroom;Assistance with cooking/housework;Assist for transportation;Help with stairs or ramp for entrance ? ?  ?Equipment Recommendations None recommended by PT  ?Recommendations for Other Services ?    ?  ?Functional Status Assessment Patient has had a recent decline in their functional status and demonstrates the ability to make significant improvements in function in a reasonable and predictable amount of time.  ? ?  ?Precautions / Restrictions Precautions ?Precautions: Fall ?Restrictions ?Weight Bearing Restrictions: No  ? ?  ? ?Mobility ? Bed Mobility ?Overal bed mobility: Needs Assistance ?Bed Mobility: Supine to Sit ?  ?  ?Supine to sit: Mod assist ?  ?  ?General bed mobility comments: Assist to manage LEs over EOB and to complete rotation to EOB sitting using bed pad ?  ? ?Transfers ?Overall transfer level: Needs assistance ?Equipment used: Rolling walker (2 wheels) ?Transfers: Sit to/from Stand ?Sit to Stand: Min assist, Mod assist, +2 physical assistance, +2 safety/equipment ?  ?  ?  ?  ?  ?General transfer comment: cues for transition position and use of UEs to self assist.  Physical assist to bring wt up and fwd and to balance in initial standing ?  ? ?Ambulation/Gait ?Ambulation/Gait assistance: Min assist, +2 physical assistance, +2 safety/equipment ?Gait Distance (Feet): 9 Feet ?Assistive device: Rolling walker (2 wheels) ?Gait Pattern/deviations: Step-to pattern, Decreased step length - right, Decreased step length - left, Shuffle, Trunk flexed, Wide base of support ?Gait velocity: decr ?  ?  ?General Gait Details: Assist for balance/support and RW management.  Cues for posture and position from RW.  Distance ltd by fatigue - exacerbated be weight of edematous LEs ? ?Stairs ?  ?  ?  ?  ?  ? ?  Wheelchair Mobility ?  ? ?Modified Rankin (Stroke Patients Only) ?  ? ?  ? ?Balance Overall balance assessment: Needs assistance ?Sitting-balance support:  Feet supported, No upper extremity supported ?Sitting balance-Leahy Scale: Good ?  ?  ?Standing balance support: During functional activity, Reliant on assistive device for balance ?Standing balance-Leahy Scale: Poor ?  ?  ?  ?  ?  ?  ?  ?  ?  ?  ?  ?  ?   ? ? ? ?Pertinent Vitals/Pain Pain Assessment ?Pain Assessment: No/denies pain  ? ? ?Home Living Family/patient expects to be discharged to:: Private residence ?Living Arrangements: Children ?Available Help at Discharge: Family;Available PRN/intermittently ?Type of Home: House ?Home Access: Ramped entrance ?  ?Entrance Stairs-Number of Steps: 2 ?  ?Home Layout: One level ?Home Equipment: Rollator (4 wheels) ?Additional Comments: was sponge bathing, lives with daughter who works  ?  ?Prior Function Prior Level of Function : Needs assist ?  ?  ?  ?  ?  ?  ?Mobility Comments: uses rollator; needing help to manage LEs on/off bed ?ADLs Comments: assistance with LB ADLs ?  ? ? ?Hand Dominance  ?   ? ?  ?Extremity/Trunk Assessment  ? Upper Extremity Assessment ?Upper Extremity Assessment: RUE deficits/detail ?RUE Deficits / Details: Grossly functional ROM, grossly 4-/5 strength, RUE edematous ?RUE Sensation: WNL ?RUE Coordination: WNL ?LUE Deficits / Details: WFL ROM, grossly 4-/5 strength ?LUE Sensation: WNL ?LUE Coordination: WNL ?  ? ?Lower Extremity Assessment ?Lower Extremity Assessment: Generalized weakness (and significant edema) ?  ? ?Cervical / Trunk Assessment ?Cervical / Trunk Assessment: Kyphotic  ?Communication  ? Communication: No difficulties  ?Cognition Arousal/Alertness: Awake/alert ?Behavior During Therapy: [facility] for tasks assessed/performed ?Overall Cognitive Status: Within Functional Limits for tasks assessed ?  ?  ?  ?  ?  ?  ?  ?  ?  ?  ?  ?  ?  ?  ?  ?  ?  ?  ?  ? ?  ?General Comments   ? ?  ?Exercises    ? ?Assessment/Plan  ?  ?PT Assessment Patient needs continued PT services  ?PT Problem List Decreased strength;Decreased activity  tolerance;Decreased balance;Decreased mobility;Decreased knowledge of use of DME;Obesity ? ?   ?  ?PT Treatment Interventions DME instruction;Gait training;Functional mobility training;Therapeutic activities;Therapeutic exercise;Balance training;Patient/family education   ? ?PT Goals (Current goals can be found in the Care Plan section)  ?Acute Rehab PT Goals ?Patient Stated Goal: HOME; regain as much IND as possible to not e a burden on her dtr ?PT Goal Formulation: With patient ?Time For Goal Achievement: 01/10/22 ?Potential to Achieve Goals: Fair ? ?  ?Frequency Min 3X/week ?  ? ? ?Co-evaluation PT/OT/SLP Co-Evaluation/Treatment: Yes ?Reason for Co-Treatment: For patient/therapist safety ?PT goals addressed during session: Mobility/safety with mobility ?OT goals addressed during session: ADL's and self-care ?  ? ? ?  ?AM-PAC PT "6 Clicks" Mobility  ?Outcome Measure Help needed turning from your back to your side while in a flat bed without using bedrails?: A Lot ?Help needed moving from lying on your back to sitting on the side of a flat bed without using bedrails?: A Lot ?Help needed moving to and from a bed to a chair (including a wheelchair)?: A Lot ?Help needed standing up from a chair using your arms (e.g., wheelchair or bedside chair)?: A Lot ?Help needed to walk in hospital room?: A Lot ?Help needed climbing 3-5 steps with a railing? : Total ?6 Click Score:  11 ? ?  ?End of Session Equipment Utilized During Treatment: Gait belt ?Activity Tolerance: Patient tolerated treatment well;Patient limited by fatigue ?Patient left: in chair;with call bell/phone within reach;with chair alarm set ?Nurse Communication: Mobility status ?PT Visit Diagnosis: Difficulty in walking, not elsewhere classified (R26.2);Muscle weakness (generalized) (M62.81) ?  ? ?Time: 8403-9795 ?PT Time Calculation (min) (ACUTE ONLY): 28 min ? ? ?Charges:   PT Evaluation ?$PT Eval Low Complexity: 1 Low ?  ?  ?   ? ? ?Debe Coder PT ?Acute  Rehabilitation Services ?Pager [phone] ?Office [phone] ? ? ?Kara Mullins ?12/27/2021, 3:59 PM ? ?

## 2021-12-27 NOTE — Progress Notes (Signed)
?PROGRESS NOTE ? ? ? ?Kara Mullins  YIA:[id number] DOB: 03/07/1951 DOA: 12/21/2021 ?PCP: [name], [name], NP  ? ?Brief Narrative:  ?Kara Mullins is an 71 y.o. 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 Eventually from SNF was discharged home daughter, relates that the day prior to admission she had a low blood pressure and a variable heart rate she was concerned she might be getting septic again, brought  her in the emergency room was found to be hypothermic and bradycardic placed on a Bear hugger work-up revealed she was COVID-positive chest x-ray showed bilateral infiltratesd,was started on IV remdesivir and IV heparin infusion as she was found to be in A-fib with RVR ? ? ?Subjective: ?3/16  A/O x4.  Resting comfortably in bed. ? ? ? ?Assessment & Plan: ? Covid vaccination; ? ?Principal Problem: ?  Sepsis ([place]) ?Active Problems: ?  COVID-19 virus infection ?  Hypothermia ?  Atrial fibrillation ([place]) ?  CKD (chronic kidney disease) stage 5, GFR less than 15 ml/min (HCC) ?  Chronic combined systolic and diastolic congestive heart failure ([place]) ?  Anemia of chronic kidney failure, stage 5 (HCC) ?  Prolonged QT interval ?  Palliative care by specialist ?  Goals of care, counseling/discussion ?  General weakness ?  Pneumonia due to COVID-19 virus ? ? ?Sepsis/ COVID-19 pneumonia: ?-Daughter relates she tested positive at the facility on 11/27/2021. ?-Completed 2 days of Remdesivir then was discontinued.  Patient on room air inflammatory markers stable will not restart to complete 5-day course.   ?-Palliative care discussed with family transitioning to hospice. ?-Patient relates  that she wants to continues to be a full code and wants full aggressive treatment she does not seems to understand the gravity of the situation, daughter is helping make the mother understand that her condition will continue to deteriorate despite treatment. ?-3/16 resolved ? ?COVID-19 Labs ? ?Recent Labs  ?  12/25/21 ?0342 12/26/21 ?0404 12/27/21 ?0425  ?DDIMER 1.68* 1.89* 1.74*  ?FERRITIN  --   --  618*  349*  ?CRP 5.5* 6.5* 6.6*  ? ? ? ?Lab Results  ?Component Value Date  ? SARSCOV2NAA POSITIVE (A) 12/22/2021  ? [place] NEGATIVE 11/09/2021  ? [place] NEGATIVE 03/05/2021  ? ?  ?  ?Hypothermia: ?-Likely due to sepsis ?-Resolved. ? ?Chronic combined systolic and diastolic CHF ?- Last 2D echo showed an EF of 50% cardiology was concerned about cardiac amyloidosis as her work-up showed abnormal light chain she underwent bone marrow biopsy that showed plasmacytosis (concerned about amyloid disease). ?-04/30/2021 Cardiac MRI showed no evidence of cardiac amyloid ?-Strict in and out -391.65m ?- Daily weight ?Filed Weights  ? 12/23/21 1230 12/27/21 0500  ?Weight: 93.7 kg 94.3 kg  ?- Coreg 3.125 mg BID ?- 3/15 Amlodipine 5 mg daily ?-3/15 Hydralazine PRN ? ?Chronic atrial fibrillation: ?- See CHF ?- After lengthy discussion with pharmacy patient a poor candidate for Lovenox and DOAC (could be discharged on apixaban 5 mg daily), given her poor creatinine clearance, thrombocytopenia. ? ?Prolonged QT interval: ?-Mildly prolonged corrected.   ? ?Essential HTN ?- See CHF ?  ?  ?Chronic kidney disease stage V: ?-Not a candidate for dialysis.  Per patient her nephrologist was the one who informed her that she is not a candidate for HD. ?-Renal function continues to deteriorate despite IV fluid hydration. ?- 3/15 the patient endorses want to be a full code I  explained to her that aggressive interventions are not going to restore her to a meaningful level of function and that her renal function will continue to deteriorate,  she will probably go into respiratory distress or uremic encephalopathy. ?-KVO IV fluids as she might go into pulmonary edema ?Lab Results  ?Component Value Date  ? CREATININE 5.36 (H) 12/27/2021  ? CREATININE 4.55 (H) 12/26/2021  ? CREATININE 5.42 (H) 12/25/2021  ? CREATININE 5.30 (H) 12/24/2021  ? CREATININE 5.09 (H) 12/23/2021  ? ?  ?Abnormal thyroid function: ?- 3/11 TSH= 7.6 ?-3/16 Free T4 =0.65  ?-Sick euthyroid syndrome? ? ?Anemia of chronic renal disease: ?-Drop in hemoglobin to 6.6 most likely lab error. ?- 3/12 transfuse 2 units PRBC  ?-3/15 Anemia panel pending ?Lab Results  ?Component Value Date  ? HGB 8.0 (L) 12/27/2021  ? HGB 8.2 (L) 12/26/2021  ? HGB 8.4 (L) 12/25/2021  ? HGB 8.1 (L) 12/24/2021  ? HGB 6.6 (LL) 12/23/2021  ? ?Thrombocytopenia: ?-Likely due to sepsis physiology, PT and INR unremarkable, will continue to follow ?-Stable ? ? ?Hypocalcemia ?-3/15 corrected calcium= 8.5 ?- 3/15 calcium gluconate 2 g ? ?Goals of care ?- 3/15 PT/OT consult: Multisystem organ failure, deconditioned, evaluate patient for CIR vs SNF ?-3/16 consult TOC SNF placement ?  ?  ?Ethics: ?Extremely poor prognosis.  Family met with palliative care the patient has not emotional things which she is getting a discussion.  Palliative care to meet with family again either today or tomorrow. ?Patient wants to be full code ? ? ? ?DVT prophylaxis: None given patient's creatinine clearance, MMP. ?Code Status: Full ?Family Communication:  ?Status is: Inpatient ? ? ? ?Dispo: The patient is from: Home ?             Anticipated d/c is to: SNF ?             Anticipated d/c date is: 3 days ?             Patient currently is medically stable to d/c. ? ? ? ? ? ?Consultants:  ?Palliative care ? ? ?Procedures/Significant Events:  ?3/12 transfuse 2 units PRBC  ? ? ? ? ?I have personally reviewed and interpreted all radiology studies and my findings are as above. ? ?VENTILATOR SETTINGS: ? ? ? ?Cultures ?3/11 influenza A/B negative ?3/11 SARS  coronavirus positive ? ? ?Antimicrobials: ?Anti-infectives (From admission, onward)  ? ? Start     Ordered Stop  ? 12/23/21 1000  remdesivir 100 mg in sodium chloride 0.9 % 100 mL IVPB       ?See Hyperspace for full Linked Orders Report.  ? 12/22/21 0322 12/24/21 1023  ? 12/22/21 0430  remdesivir 100 mg in sodium chloride 0.9 % 100 mL IVPB       ?See Hyperspace for full Linked Orders Report.  ? 12/22/21 0322 12/22/21 0439  ? 12/22/21 0400  remdesivir 100 mg in sodium chloride 0.9 % 100 mL IVPB       ?See Hyperspace for full Linked Orders Report.  ? 12/22/21 0322 12/22/21 0439  ? 12/22/21 0045  vancomycin (VANCOCIN) IVPB 1000 mg/200 mL premix       ? 12/22/21 0033 12/22/21 0143  ? 12/22/21 0045  ceFEPIme (MAXIPIME) 2 g in sodium chloride  0.9 % 100 mL IVPB       ? 12/22/21 0033 12/22/21 0106  ? ?  ?  ? ? ?Devices ?  ? ?LINES / TUBES:  ? ? ? ? ?Continuous Infusions: ? sodium chloride 250 mL (12/26/21 0938)  ? ? ? ?Objective: ?Vitals:  ? 12/27/21 0223 12/27/21 0500 12/27/21 2549 12/27/21 1503  ?BP:   132/64 108/62  ?Pulse:   64 66  ?Resp:   15 20  ?Temp: (!) 95.2 ?F (35.1 ?C)  98.5 ?F (36.9 ?C) 98.2 ?F (36.8 ?C)  ?TempSrc:   Oral Oral  ?SpO2:   100% 100%  ?Weight:  94.3 kg    ?Height:      ? ? ?Intake/Output Summary (Last 24 hours) at 12/27/2021 1940 ?Last data filed at 12/27/2021 1800 ?Gross per 24 hour  ?Intake 351.32 ml  ?Output 650 ml  ?Net -298.68 ml  ? ? ?Filed Weights  ? 12/23/21 1230 12/27/21 0500  ?Weight: 93.7 kg 94.3 kg  ? ? ?Examination: ? ?General: A/O x4, No acute respiratory distress ?Eyes: negative scleral hemorrhage, negative anisocoria, negative icterus ?ENT: Negative Runny nose, negative gingival bleeding, ?Neck:  Negative scars, masses, torticollis, lymphadenopathy, JVD ?Lungs: Clear to auscultation bilaterally without wheezes or crackles ?Cardiovascular: Regular rate and rhythm without murmur gallop or rub normal S1 and S2 ?Abdomen: negative abdominal pain, nondistended, positive soft, bowel sounds,  no rebound, no ascites, no appreciable mass ?Extremities: No significant cyanosis, clubbing, or edema bilateral lower extremities ?Skin: Negative rashes, lesions, ulcers ?Psychiatric:  Negative depress

## 2021-12-28 DIAGNOSIS — T68XXXA Hypothermia, initial encounter: Secondary | ICD-10-CM | POA: Diagnosis not present

## 2021-12-28 DIAGNOSIS — I5042 Chronic combined systolic (congestive) and diastolic (congestive) heart failure: Secondary | ICD-10-CM | POA: Diagnosis not present

## 2021-12-28 DIAGNOSIS — I482 Chronic atrial fibrillation, unspecified: Secondary | ICD-10-CM | POA: Diagnosis not present

## 2021-12-28 DIAGNOSIS — N185 Chronic kidney disease, stage 5: Secondary | ICD-10-CM | POA: Diagnosis not present

## 2021-12-28 LAB — COMPREHENSIVE METABOLIC PANEL
ALT: 12 U/L (ref 0–44)
AST: 22 U/L (ref 15–41)
Albumin: <1.5 g/dL — ABNORMAL LOW (ref 3.5–5.0)
Alkaline Phosphatase: 100 U/L (ref 38–126)
Anion gap: 8 (ref 5–15)
BUN: 84 mg/dL — ABNORMAL HIGH (ref 8–23)
CO2: 15 mmol/L — ABNORMAL LOW (ref 22–32)
Calcium: 6.8 mg/dL — ABNORMAL LOW (ref 8.9–10.3)
Chloride: 117 mmol/L — ABNORMAL HIGH (ref 98–111)
Creatinine, Ser: 5.42 mg/dL — ABNORMAL HIGH (ref 0.44–1.00)
GFR, Estimated: 8 mL/min — ABNORMAL LOW (ref >60)
Glucose, Bld: 90 mg/dL (ref 70–99)
Potassium: 3.9 mmol/L (ref 3.5–5.1)
Sodium: 140 mmol/L (ref 135–145)
Total Bilirubin: 0.4 mg/dL (ref 0.3–1.2)
Total Protein: 4.6 g/dL — ABNORMAL LOW (ref 6.5–8.1)

## 2021-12-28 LAB — CBC WITH DIFFERENTIAL/PLATELET
Abs Immature Granulocytes: 0.01 10*3/uL (ref 0.00–0.07)
Basophils Absolute: 0 10*3/uL (ref 0.0–0.1)
Basophils Relative: 0 %
Eosinophils Absolute: 0.1 10*3/uL (ref 0.0–0.5)
Eosinophils Relative: 2 %
HCT: 25.1 % — ABNORMAL LOW (ref 36.0–46.0)
Hemoglobin: 8 g/dL — ABNORMAL LOW (ref 12.0–15.0)
Immature Granulocytes: 0 %
Lymphocytes Relative: 18 %
Lymphs Abs: 0.8 10*3/uL (ref 0.7–4.0)
MCH: 27.7 pg (ref 26.0–34.0)
MCHC: 31.9 g/dL (ref 30.0–36.0)
MCV: 86.9 fL (ref 80.0–100.0)
Monocytes Absolute: 0.5 10*3/uL (ref 0.1–1.0)
Monocytes Relative: 11 %
Neutro Abs: 3 10*3/uL (ref 1.7–7.7)
Neutrophils Relative %: 69 %
Platelets: 50 10*3/uL — ABNORMAL LOW (ref 150–400)
RBC: 2.89 MIL/uL — ABNORMAL LOW (ref 3.87–5.11)
RDW: 19.1 % — ABNORMAL HIGH (ref 11.5–15.5)
WBC: 4.3 10*3/uL (ref 4.0–10.5)
nRBC: 0.5 % — ABNORMAL HIGH (ref 0.0–0.2)

## 2021-12-28 LAB — C-REACTIVE PROTEIN: CRP: 7.3 mg/dL — ABNORMAL HIGH (ref <1.0)

## 2021-12-28 LAB — SEDIMENTATION RATE: Sed Rate: 102 mm/hr — ABNORMAL HIGH (ref 0–22)

## 2021-12-28 LAB — FERRITIN: Ferritin: 482 ng/mL — ABNORMAL HIGH (ref 11–307)

## 2021-12-28 LAB — PHOSPHORUS: Phosphorus: 9.4 mg/dL — ABNORMAL HIGH (ref 2.5–4.6)

## 2021-12-28 LAB — MAGNESIUM: Magnesium: 2.9 mg/dL — ABNORMAL HIGH (ref 1.7–2.4)

## 2021-12-28 LAB — D-DIMER, QUANTITATIVE: D-Dimer, Quant: 2.25 ug/mL-FEU — ABNORMAL HIGH (ref 0.00–0.50)

## 2021-12-28 NOTE — Care Management Important Message (Signed)
Important Message ? ?Patient Details IM Letter placed in Patients room. ?Name: Kara Mullins ?MRN: [id number] ?Date of Birth: [birth year] ? ? ?Medicare Important Message Given:  Yes ? ? ? ? ?[name] ?12/28/2021, 10:52 AM ?

## 2021-12-28 NOTE — Telephone Encounter (Signed)
Patient daughter, Bradee Common [phone] walked into office with FMLA Paperwork and a note stating: ?"Dear Webb Silversmith, Bebe Liter is requiring to refill the paperwork out again. I have been out of work since 12/11/2021. I had to take my mom out of the nursing home because of poor care. She is now back at [facility]. She went back to the hospital on 12/21/2021 and is still there. Could you help fill my paperwork out again, so I can keep my job and go back to work. Thank you." ? ?FMLA Paperwork placed in [name]'s folder to review, fill out and sign.  ? ?To be faxed back to [place] once completed Fax:[phone] ? ?Team Member ID: [id number] ?Case #:[id number]  ?

## 2021-12-28 NOTE — TOC Progression Note (Signed)
Transition of Care (TOC) - Progression Note  ? ? ?Patient Details  ?Name: Kara Mullins ?MRN: [id number] ?Date of Birth: 23-Oct-1950 ? ?Transition of Care (TOC) CM/SW Contact  ?Dessa Phi, RN ?Phone Number: ?12/28/2021, 10:51 AM ? ?Clinical Narrative: Noted Palliative Care Meeting-await recc.   ? ? ? ?Expected Discharge Plan:  (TBD) ?Barriers to Discharge: Continued Medical Work up ? ?Expected Discharge Plan and Services ?Expected Discharge Plan:  (TBD) ?  ?  ?  ?  ?                ?  ?  ?  ?  ?  ?  ?  ?  ?  ?  ? ? ?Social Determinants of Health (SDOH) Interventions ?  ? ?Readmission Risk Interventions ?Readmission Risk Prevention Plan 12/23/2021  ?Transportation Screening Complete  ?PCP or Specialist Appt within 3-5 Days Complete  ?[place] or Home Care Consult Complete  ?Social Work Consult for [place] Planning/Counseling Complete  ?Palliative Care Screening Not Applicable  ?Medication Review Press photographer) Complete  ?Some recent data might be hidden  ? ? ?

## 2021-12-28 NOTE — Progress Notes (Signed)
?PROGRESS NOTE ? ? ? ?Kara Mullins  UJW:[id number] DOB: 1950/12/21 DOA: 12/21/2021 ?PCP: [name], [name], NP  ? ?Brief Narrative:  ?Kara Mullins is an 71 y.o. 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 Eventually from SNF was discharged home daughter, relates that the day prior to admission she had a low blood pressure and a variable heart rate she was concerned she might be getting septic again, brought  her in the emergency room was found to be hypothermic and bradycardic placed on a Bear hugger work-up revealed she was COVID-positive chest x-ray showed bilateral infiltratesd,was started on IV remdesivir and IV heparin infusion as she was found to be in A-fib with RVR ? ? ?Subjective: ?3/17 A/O x4, resting comfortably in bed.  Daughter at bedside. ? ? ? ?Assessment & Plan: ? Covid vaccination; ? ?Principal Problem: ?  Sepsis ([place]) ?Active Problems: ?  COVID-19 virus infection ?  Hypothermia ?  Atrial fibrillation ([place]) ?  CKD (chronic kidney disease) stage 5, GFR less than 15 ml/min (HCC) ?  Chronic combined systolic and diastolic congestive heart failure ([place]) ?  Anemia of chronic kidney failure, stage 5 (HCC) ?  Prolonged QT interval ?  Palliative care by specialist ?  Goals of care, counseling/discussion ?  General weakness ?  Pneumonia due to COVID-19 virus ? ? ?Sepsis/ COVID-19 pneumonia: ?-Daughter relates she tested positive at the facility on 11/27/2021. ?-Completed 2 days of Remdesivir then was discontinued.  Patient on room air inflammatory markers stable will not restart to complete 5-day course.   ?-Palliative care discussed with family transitioning to  hospice. ?-Patient relates that she wants to continues to be a full code and wants full aggressive treatment she does not seems to understand the gravity of the situation, daughter is helping make the mother understand that her condition will continue to deteriorate despite treatment. ?-3/16 resolved ? ?COVID-19 Labs ? ?Recent Labs  ?  12/26/21 ?0404 12/27/21 ?0425 12/28/21 ?0400  ?DDIMER 1.89* 1.74* 2.25*  ?FERRITIN  --  618*  349* 482*  ?CRP 6.5* 6.6* 7.3*  ? ? ? ?Lab Results  ?Component Value Date  ? SARSCOV2NAA POSITIVE (A) 12/22/2021  ? [place] NEGATIVE 11/09/2021  ? [place] NEGATIVE 03/05/2021  ?-3/17 D-dimer may be trending up, patient currently asymptomatic.  Likely secondary to increasing renal failure.  Unfortunately even if positive for DVT or PE given her poor creatinine clearance, thrombocytopenia, would be extremely hesitant to anticoagulate patient. ? ?  ?Hypothermia: ?-Likely due to sepsis ?-Resolved. ? ?Chronic combined systolic and diastolic CHF ?- Last 2D echo showed an EF of 50% cardiology was concerned about cardiac amyloidosis as her work-up showed abnormal light chain she underwent bone marrow biopsy that showed plasmacytosis (concerned about amyloid disease). ?-04/30/2021 Cardiac MRI showed no evidence of cardiac amyloid ?-Strict in and out -186.5 ml ?- Daily weight ?Filed Weights  ? 12/23/21 1230 12/27/21 0500 12/28/21 0418  ?Weight: 93.7 kg 94.3 kg 98.2 kg  ?- Coreg 3.125 mg BID ?- 3/15 Amlodipine 5 mg daily ?-3/15 Hydralazine PRN ? ?Chronic atrial fibrillation: ?- See CHF ?- After lengthy discussion with pharmacy patient a poor candidate for Lovenox and DOAC (could be discharged  on apixaban 5 mg daily), given her poor creatinine clearance, thrombocytopenia. ? ?Prolonged QT interval: ?-Mildly prolonged corrected.   ? ?Essential HTN ?- See CHF ?  ?  ?Chronic kidney disease stage V: ?-Not a candidate for dialysis.  Per patient her nephrologist was the one who informed her that she is  not a candidate for HD. ?-Renal function continues to deteriorate despite IV fluid hydration. ?- 3/15 the patient endorses want to be a full code I  explained to her that aggressive interventions are not going to restore her to a meaningful level of function and that her renal function will continue to deteriorate, she will probably go into respiratory distress or uremic encephalopathy. ?-KVO IV fluids as she might go into pulmonary edema ?Lab Results  ?Component Value Date  ? CREATININE 5.42 (H) 12/28/2021  ? CREATININE 5.36 (H) 12/27/2021  ? CREATININE 4.55 (H) 12/26/2021  ? CREATININE 5.42 (H) 12/25/2021  ? CREATININE 5.30 (H) 12/24/2021  ? ?  ?Abnormal thyroid function: ?- 3/11 TSH= 7.6 ?-3/16 Free T4 =0.65  ?-Sick euthyroid syndrome? ? ?Anemia of chronic renal disease: ?-Drop in hemoglobin to 6.6 most likely lab error. ?- 3/12 transfuse 2 units PRBC  ?-3/15 Anemia panel pending ?Lab Results  ?Component Value Date  ? HGB 8.0 (L) 12/28/2021  ? HGB 8.0 (L) 12/27/2021  ? HGB 8.2 (L) 12/26/2021  ? HGB 8.4 (L) 12/25/2021  ? HGB 8.1 (L) 12/24/2021  ? ?Thrombocytopenia: ?-Likely due to sepsis physiology, PT and INR unremarkable, will continue to follow ?-Stable ? ? ?Hypocalcemia ?-3/15 corrected calcium= 8.5 ?- 3/15 calcium gluconate 2 g ? ?Goals of care ?- 3/15 PT/OT consult: Multisystem organ failure, deconditioned, evaluate patient for CIR vs SNF ?-3/16 consult TOC SNF placement ?-3/17 reconsult PT/OT: Multisystem organ failure, deconditioned, evaluate patient for SNF and indicate in documentation appropriate for SNF ?  ?  ?Ethics: ?Extremely poor prognosis.  Family met with palliative care the patient has not emotional things which she is getting a discussion.  Palliative care to meet with family again either today or tomorrow. ?Patient wants to be full code ? ? ? ?DVT prophylaxis: None given patient's creatinine clearance, MMP. ?Code Status: Full ?Family Communication: 3/17 daughter at bedside for discussion plan  of care all questions answered ?Status is: Inpatient ? ? ? ?Dispo: The patient is from: Home ?             Anticipated d/c is to: SNF ?             Anticipated d/c date is: 3 days ?             Patient currently is medically stable to d/c. ? ? ? ? ? ?Consultants:  ?Palliative care ? ? ?Procedures/Significant Events:  ?3/12 transfuse 2 units PRBC  ? ? ? ? ?I have personally reviewed and interpreted all radiology studies and my findings are as above. ? ?VENTILATOR SETTINGS: ? ? ? ?Cultures ?3/11 influenza A/B negative ?3/11 SARS coronavirus positive ? ? ?Antimicrobials: ?Anti-infectives (From admission, onward)  ? ? Start     Ordered Stop  ? 12/23/21 1000  remdesivir 100 mg in sodium chloride 0.9 % 100 mL IVPB       ?See Hyperspace for full Linked Orders Report.  ? 12/22/21 0322 12/24/21 1023  ? 12/22/21 0430  remdesivir 100 mg in sodium chloride 0.9 % 100 mL IVPB       ?See Hyperspace for full Linked Orders Report.  ? 12/22/21 0322 12/22/21 0439  ?  12/22/21 0400  remdesivir 100 mg in sodium chloride 0.9 % 100 mL IVPB       ?See Hyperspace for full Linked Orders Report.  ? 12/22/21 0322 12/22/21 0439  ? 12/22/21 0045  vancomycin (VANCOCIN) IVPB 1000 mg/200 mL premix       ? 12/22/21 0033 12/22/21 0143  ? 12/22/21 0045  ceFEPIme (MAXIPIME) 2 g in sodium chloride 0.9 % 100 mL IVPB       ? 12/22/21 0033 12/22/21 0106  ? ?  ?  ? ? ?Devices ?  ? ?LINES / TUBES:  ? ? ? ? ?Continuous Infusions: ? sodium chloride 250 mL (12/26/21 0938)  ? ? ? ?Objective: ?Vitals:  ? 12/28/21 0414 12/28/21 0418 12/28/21 0920 12/28/21 1347  ?BP: 131/69  125/72 131/70  ?Pulse: 68  64 60  ?Resp: 18   18  ?Temp: 98.2 ?F (36.8 ?C)   (!) 97.5 ?F (36.4 ?C)  ?TempSrc: Oral   Oral  ?SpO2: 100%   100%  ?Weight:  98.2 kg    ?Height:      ? ? ?Intake/Output Summary (Last 24 hours) at 12/28/2021 1653 ?Last data filed at 12/28/2021 1100 ?Gross per 24 hour  ?Intake 480 ml  ?Output 525 ml  ?Net -45 ml  ? ? ?Filed Weights  ? 12/23/21 1230 12/27/21 0500 12/28/21  0418  ?Weight: 93.7 kg 94.3 kg 98.2 kg  ? ? ?Examination: ? ?General: A/O x4, No acute respiratory distress ?Eyes: negative scleral hemorrhage, negative anisocoria, negative icterus ?ENT: Negative Runny nose,

## 2021-12-28 NOTE — Progress Notes (Signed)
Chaplain followed up to see if patient still needed assistance getting advance directives notarized.  Kara Mullins had already had a notary come and was no longer in need.  Chaplain asked about other support needs and Kara Mullins and her daughter said they are doing okay for now. ? ?[employer], Bcc ?Pager, [phone] ? ?

## 2021-12-29 DIAGNOSIS — I5042 Chronic combined systolic (congestive) and diastolic (congestive) heart failure: Secondary | ICD-10-CM | POA: Diagnosis not present

## 2021-12-29 DIAGNOSIS — N185 Chronic kidney disease, stage 5: Secondary | ICD-10-CM | POA: Diagnosis not present

## 2021-12-29 DIAGNOSIS — T68XXXA Hypothermia, initial encounter: Secondary | ICD-10-CM | POA: Diagnosis not present

## 2021-12-29 DIAGNOSIS — I482 Chronic atrial fibrillation, unspecified: Secondary | ICD-10-CM | POA: Diagnosis not present

## 2021-12-29 LAB — CBC WITH DIFFERENTIAL/PLATELET
Abs Immature Granulocytes: 0.01 10*3/uL (ref 0.00–0.07)
Basophils Absolute: 0 10*3/uL (ref 0.0–0.1)
Basophils Relative: 0 %
Eosinophils Absolute: 0.1 10*3/uL (ref 0.0–0.5)
Eosinophils Relative: 2 %
HCT: 23.9 % — ABNORMAL LOW (ref 36.0–46.0)
Hemoglobin: 7.5 g/dL — ABNORMAL LOW (ref 12.0–15.0)
Immature Granulocytes: 0 %
Lymphocytes Relative: 24 %
Lymphs Abs: 1 10*3/uL (ref 0.7–4.0)
MCH: 27.8 pg (ref 26.0–34.0)
MCHC: 31.4 g/dL (ref 30.0–36.0)
MCV: 88.5 fL (ref 80.0–100.0)
Monocytes Absolute: 0.5 10*3/uL (ref 0.1–1.0)
Monocytes Relative: 11 %
Neutro Abs: 2.6 10*3/uL (ref 1.7–7.7)
Neutrophils Relative %: 63 %
Platelets: 62 10*3/uL — ABNORMAL LOW (ref 150–400)
RBC: 2.7 MIL/uL — ABNORMAL LOW (ref 3.87–5.11)
RDW: 19 % — ABNORMAL HIGH (ref 11.5–15.5)
Smear Review: DECREASED
WBC: 4.2 10*3/uL (ref 4.0–10.5)
nRBC: 0 % (ref 0.0–0.2)

## 2021-12-29 LAB — SEDIMENTATION RATE: Sed Rate: 110 mm/hr — ABNORMAL HIGH (ref 0–22)

## 2021-12-29 LAB — C-REACTIVE PROTEIN: CRP: 7.9 mg/dL — ABNORMAL HIGH (ref <1.0)

## 2021-12-29 LAB — COMPREHENSIVE METABOLIC PANEL
ALT: 12 U/L (ref 0–44)
AST: 22 U/L (ref 15–41)
Albumin: <1.5 g/dL — ABNORMAL LOW (ref 3.5–5.0)
Alkaline Phosphatase: 94 U/L (ref 38–126)
Anion gap: 10 (ref 5–15)
BUN: 85 mg/dL — ABNORMAL HIGH (ref 8–23)
CO2: 15 mmol/L — ABNORMAL LOW (ref 22–32)
Calcium: 6.8 mg/dL — ABNORMAL LOW (ref 8.9–10.3)
Chloride: 115 mmol/L — ABNORMAL HIGH (ref 98–111)
Creatinine, Ser: 5.53 mg/dL — ABNORMAL HIGH (ref 0.44–1.00)
GFR, Estimated: 8 mL/min — ABNORMAL LOW (ref >60)
Glucose, Bld: 84 mg/dL (ref 70–99)
Potassium: 3.9 mmol/L (ref 3.5–5.1)
Sodium: 140 mmol/L (ref 135–145)
Total Bilirubin: 0.4 mg/dL (ref 0.3–1.2)
Total Protein: 4.5 g/dL — ABNORMAL LOW (ref 6.5–8.1)

## 2021-12-29 LAB — PHOSPHORUS: Phosphorus: 9.2 mg/dL — ABNORMAL HIGH (ref 2.5–4.6)

## 2021-12-29 LAB — FERRITIN: Ferritin: 551 ng/mL — ABNORMAL HIGH (ref 11–307)

## 2021-12-29 LAB — D-DIMER, QUANTITATIVE: D-Dimer, Quant: 2.28 ug/mL-FEU — ABNORMAL HIGH (ref 0.00–0.50)

## 2021-12-29 LAB — MAGNESIUM: Magnesium: 2.9 mg/dL — ABNORMAL HIGH (ref 1.7–2.4)

## 2021-12-29 NOTE — Progress Notes (Signed)
Occupational Therapy Treatment ?Patient Details ?Name: Kara Mullins ?MRN: [id number] ?DOB: 1951-10-02 ?Today's Date: 12/29/2021 ? ? ?History of present illness 71 y.o. female with medical history significant of chronic combined systolic and diastolic heart failure, generalized anxiety disorder, hyperlipidemia, hypertension, stage IIIb CKD who was brought to the emergency department due to inability to swallow less than an hour before arrival. While in ED, pt had episode of syncope, found to be hypothermic and evidence suggesting UTI. Dx of sepsis. ?  ?OT comments ? Patient was motivated to participate in session. Patient indicated feeling dizziness with sitting on edge ofb ed with BP of 113/63 mmhg and after activity BP was noted to be 91/58 mmhg. Nurse made aware. Patient was educated on incentive spirometer, positioning, pressure relief and benefits of getting out of bed each day. Patient verbalized and demonstrated understanding. Patient would continue to benefit from skilled OT services at this time while admitted and after d/c to address noted deficits in order to improve overall safety and independence in ADLs.  ?  ? ?Recommendations for follow up therapy are one component of a multi-disciplinary discharge planning process, led by the attending physician.  Recommendations may be updated based on patient status, additional functional criteria and insurance authorization. ?   ?Follow Up Recommendations ? Skilled nursing-short term rehab (<3 hours/day)  ?  ?Assistance Recommended at Discharge    ?Patient can return home with the following ? A lot of help with bathing/dressing/bathroom;A little help with walking and/or transfers;Assistance with cooking/housework;Help with stairs or ramp for entrance;Assist for transportation ?  ?Equipment Recommendations ? None recommended by OT  ?  ?Recommendations for Other Services   ? ?  ?Precautions / Restrictions Precautions ?Precautions: Fall ?Precaution Comments: monitor  BP- orthostatic ?Restrictions ?Weight Bearing Restrictions: No  ? ? ?  ? ?Mobility Bed Mobility ?  ?  ?  ?  ?  ?  ?  ?  ?  ? ?Transfers ?  ?  ?  ?  ?  ?  ?  ?  ?  ?  ?  ?  ?Balance   ?  ?  ?  ?  ?  ?  ?  ?  ?  ?  ?  ?  ?  ?  ?  ?  ?  ?  ?   ? ?ADL either performed or assessed with clinical judgement  ? ?ADL Overall ADL's : Needs assistance/impaired ?  ?  ?  ?  ?  ?  ?  ?Lower Body Bathing Details (indicate cue type and reason): patient reported having complete all morning ADLs this AM. ?  ?  ?  ?Lower Body Dressing Details (indicate cue type and reason): patient reported she was able to get socks on and off herself. patietn was unable to clear heel with legs in log sitting or short sitting in recliner without max A. patient reported daughter assists sometimes at home. ?  ?Toilet Transfer Details (indicate cue type and reason): patient was able to transfer around room with mod A 1 and +1 for safety. ?  ?  ?  ?  ?  ?General ADL Comments: patient was educated on participation in incenstive spirometer and how to monitor use appropirately. patient was able to demonstrate understanding. patient was educated on importance of keeping BLE elevated during the day to reduce edmea and removal of grippers when at rest to prevent pocketing. patient verbalized understanding. patient was educated on participation in time out of bed each day to  prevent declines. patient verbalized understanding. patient was educated on pressure relief techniques when sitting in recliner and importance of completing them evey hour when in chair. patient verbalized and demonstrated safe way to offload pressure in chair with MI. ?  ? ?Extremity/Trunk Assessment   ?  ?  ?  ?  ?  ? ?Vision   ?  ?  ?Perception   ?  ?Praxis   ?  ? ?Cognition Arousal/Alertness: Awake/alert ?Behavior During Therapy: [facility] for tasks assessed/performed ?Overall Cognitive Status: Within Functional Limits for tasks assessed ?  ?  ?  ?  ?  ?  ?  ?  ?  ?  ?  ?  ?  ?  ?  ?  ?  ?  ?   ?   ?Exercises   ? ?  ?Shoulder Instructions   ? ? ?  ?General Comments    ? ? ?Pertinent Vitals/ Pain       Pain Assessment ?Pain Assessment: No/denies pain ? ?Home Living   ?  ?  ?  ?  ?  ?  ?  ?  ?  ?  ?  ?  ?  ?  ?  ?  ?  ?  ? ?  ?Prior Functioning/Environment    ?  ?  ?  ?   ? ?Frequency ? Min 2X/week  ? ? ? ? ?  ?Progress Toward Goals ? ?OT Goals(current goals can now be found in the care plan section) ? Progress towards OT goals: Progressing toward goals ? ?   ?Plan     ? ?Co-evaluation ? ? ?   ?Reason for Co-Treatment: For patient/therapist safety ?PT goals addressed during session: Mobility/safety with mobility ?OT goals addressed during session: ADL's and self-care ?  ? ?  ?AM-PAC OT "6 Clicks" Daily Activity     ?Outcome Measure ? ? Help from another person eating meals?: A Little ?Help from another person taking care of personal grooming?: A Little ?Help from another person toileting, which includes using toliet, bedpan, or urinal?: A Lot ?Help from another person bathing (including washing, rinsing, drying)?: A Lot ?Help from another person to put on and taking off regular upper body clothing?: A Little ?Help from another person to put on and taking off regular lower body clothing?: A Lot ?6 Click Score: 15 ? ?  ?End of Session Equipment Utilized During Treatment: Rolling walker (2 wheels) ? ?OT Visit Diagnosis: Muscle weakness (generalized) (M62.81) ?  ?Activity Tolerance Patient tolerated treatment well ?  ?Patient Left in chair;with call bell/phone within reach;with chair alarm set ?  ?Nurse Communication Mobility status ?  ? ?   ? ?Time: 1696-7893 ?OT Time Calculation (min): 29 min ? ?Charges: OT General Charges ?$OT Visit: 1 Visit ?OT Treatments ?$Therapeutic Activity: 8-22 mins ? ?Luismiguel Lamere OTR/L, MS ?Acute Rehabilitation Department ?Office# [phone] ?Pager# [phone] ? ? ?[place] ?12/29/2021, 3:40 PM ?

## 2021-12-29 NOTE — Progress Notes (Addendum)
Physical Therapy Treatment ?Patient Details ?Name: Kara Mullins ?MRN: [id number] ?DOB: 04/17/1951 ?Today's Date: 12/29/2021 ? ? ?History of Present Illness 71 y.o. female with medical history significant of chronic combined systolic and diastolic heart failure, generalized anxiety disorder, hyperlipidemia, hypertension, stage IIIb CKD who was brought to the emergency department due to inability to swallow less than an hour before arrival. While in ED, pt had episode of syncope, found to be hypothermic and evidence suggesting UTI. Dx of sepsis. ? ?  ?PT Comments  ? ? Pt very pleasant and motivated and progressing slowly with mobility - noted improvement in activity tolerance and with decreased assist for most tasks.  However, pt continues with limited endurance and requiring assist for safe performance of all mobility tasks and would benefit from follow up rehab at SNF level to maximize IND and safety.  This date, pt c/o mild dizziness in sitting with BP 113/63; and increasing dizziness with ambulation in room with BP 91/58 - RN aware.  ?Recommendations for follow up therapy are one component of a multi-disciplinary discharge planning process, led by the attending physician.  Recommendations may be updated based on patient status, additional functional criteria and insurance authorization. ? ?Follow Up Recommendations ? Skilled nursing-short term rehab (<3 hours/day) ?  ?  ?Assistance Recommended at Discharge Frequent or constant Supervision/Assistance  ?Patient can return home with the following A little help with walking and/or transfers;A little help with bathing/dressing/bathroom;Assistance with cooking/housework;Assist for transportation;Help with stairs or ramp for entrance ?  ?Equipment Recommendations ? None recommended by PT  ?  ?Recommendations for Other Services   ? ? ?  ?Precautions / Restrictions Precautions ?Precautions: Fall ?Precaution Comments: monitor BP- orthostatic ?Restrictions ?Weight Bearing  Restrictions: No  ?  ? ?Mobility ? Bed Mobility ?Overal bed mobility: Needs Assistance ?Bed Mobility: Supine to Sit ?  ?  ?Supine to sit: Min assist ?  ?  ?General bed mobility comments: Assist to manage LEs over EOB and to complete rotation to EOB sitting using bed pad ?  ? ?Transfers ?Overall transfer level: Needs assistance ?Equipment used: Rolling walker (2 wheels) ?Transfers: Sit to/from Stand ?Sit to Stand: Min assist, +2 physical assistance, +2 safety/equipment, From elevated surface ?  ?  ?  ?  ?  ?General transfer comment: cues for transition position and use of UEs to self assist.  Physical assist to bring wt up and fwd and to balance in initial standing ?  ? ?Ambulation/Gait ?Ambulation/Gait assistance: Min assist, +2 safety/equipment ?Gait Distance (Feet): 17 Feet ?Assistive device: Rolling walker (2 wheels) ?Gait Pattern/deviations: Step-to pattern, Decreased step length - right, Decreased step length - left, Shuffle, Trunk flexed, Wide base of support ?Gait velocity: decr ?  ?  ?General Gait Details: Assist for balance/support and RW management.  Cues for posture and position from RW.  Distance ltd by fatigue and c/o increasing dizziness ? ? ?Stairs ?  ?  ?  ?  ?  ? ? ?Wheelchair Mobility ?  ? ?Modified Rankin (Stroke Patients Only) ?  ? ? ?  ?Balance Overall balance assessment: Needs assistance ?Sitting-balance support: Feet supported, No upper extremity supported ?Sitting balance-Leahy Scale: Good ?  ?  ?Standing balance support: During functional activity, Reliant on assistive device for balance ?Standing balance-Leahy Scale: Poor ?  ?  ?  ?  ?  ?  ?  ?  ?  ?  ?  ?  ?  ? ?  ?Cognition   ?  ?  ?  ?  ?  ?  ?  ?  ?  ?  ?  ?  ?  ?  ?  ?  ?  ?  ?  ?  ?  ? ?  ?  Exercises   ? ?  ?General Comments   ?  ?  ? ?Pertinent Vitals/Pain    ? ? ?Home Living   ?  ?  ?  ?  ?  ?  ?  ?  ?  ?   ?  ?Prior Function    ?  ?  ?   ? ?PT Goals (current goals can now be found in the care plan section) Acute Rehab PT Goals ?PT  Goal Formulation: With patient ?Time For Goal Achievement: 01/10/22 ?Potential to Achieve Goals: Fair ?Progress towards PT goals: Progressing toward goals ? ?  ?Frequency ? ? ? Min 3X/week ? ? ? ?  ?PT Plan Discharge plan needs to be updated  ? ? ?Co-evaluation PT/OT/SLP Co-Evaluation/Treatment: Yes ?Reason for Co-Treatment: For patient/therapist safety ?PT goals addressed during session: Mobility/safety with mobility ?OT goals addressed during session: ADL's and self-care ?  ? ?  ?AM-PAC PT "6 Clicks" Mobility   ?Outcome Measure ? Help needed turning from your back to your side while in a flat bed without using bedrails?: A Lot ?Help needed moving from lying on your back to sitting on the side of a flat bed without using bedrails?: A Little ?Help needed moving to and from a bed to a chair (including a wheelchair)?: A Lot ?Help needed standing up from a chair using your arms (e.g., wheelchair or bedside chair)?: A Lot ?Help needed to walk in hospital room?: A Little ?Help needed climbing 3-5 steps with a railing? : Total ?6 Click Score: 13 ? ?  ?End of Session Equipment Utilized During Treatment: Gait belt ?Activity Tolerance: Patient tolerated treatment well;Patient limited by fatigue ?Patient left: in chair;with call bell/phone within reach;with chair alarm set ?Nurse Communication: Mobility status ?PT Visit Diagnosis: Difficulty in walking, not elsewhere classified (R26.2);Muscle weakness (generalized) (M62.81) ?  ? ? ?Time: 1610-9604 ?PT Time Calculation (min) (ACUTE ONLY): 29 min ? ?Charges:  $Gait Training: 8-22 mins          ?          ? ?Debe Coder PT ?Acute Rehabilitation Services ?Pager [phone] ?Office [phone] ? ? ? ?Quinn [place] ?12/29/2021, 2:44 PM ? ?

## 2021-12-29 NOTE — Progress Notes (Signed)
?PROGRESS NOTE ? ? ? ?Kara Mullins  HFW:[id number] DOB: Nov 14, 1950 DOA: 12/21/2021 ?PCP: [name], [name], NP  ? ?Brief Narrative:  ?Kara Mullins is an 71 y.o. 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 Eventually from SNF was discharged home daughter, relates that the day prior to admission she had a low blood pressure and a variable heart rate she was concerned she might be getting septic again, brought  her in the emergency room was found to be hypothermic and bradycardic placed on a Bear hugger work-up revealed she was COVID-positive chest x-ray showed bilateral infiltratesd,was started on IV remdesivir and IV heparin infusion as she was found to be in A-fib with RVR ? ? ?Subjective: ?3/18 A/O x4.  Resting comfortably in bed.  States worked with PT today. ? ? ?Assessment & Plan: ? Covid vaccination; ? ?Principal Problem: ?  Sepsis ([place]) ?Active Problems: ?  COVID-19 virus infection ?  Hypothermia ?  Atrial fibrillation ([place]) ?  CKD (chronic kidney disease) stage 5, GFR less than 15 ml/min (HCC) ?  Chronic combined systolic and diastolic congestive heart failure ([place]) ?  Anemia of chronic kidney failure, stage 5 (HCC) ?  Prolonged QT interval ?  Palliative care by specialist ?  Goals of care, counseling/discussion ?  General weakness ?  Pneumonia due to COVID-19 virus ? ? ?Sepsis/ COVID-19 pneumonia: ?-Daughter relates she tested positive at the facility on 11/27/2021. ?-Completed 2 days of Remdesivir then was discontinued.  Patient on room air inflammatory markers stable will not restart to complete 5-day course.   ?-Palliative care discussed with family transitioning to  hospice. ?-Patient relates that she wants to continues to be a full code and wants full aggressive treatment she does not seems to understand the gravity of the situation, daughter is helping make the mother understand that her condition will continue to deteriorate despite treatment. ?-3/16 resolved ? ?COVID-19 Labs ? ?Recent Labs  ?  12/27/21 ?0425 12/28/21 ?0400 12/29/21 ?0277  ?DDIMER 1.74* 2.25* 2.28*  ?FERRITIN 618*  349* 482* 551*  ?CRP 6.6* 7.3* 7.9*  ? ? ? ?Lab Results  ?Component Value Date  ? SARSCOV2NAA POSITIVE (A) 12/22/2021  ? [place] NEGATIVE 11/09/2021  ? [place] NEGATIVE 03/05/2021  ? ?-3/17 D-dimer may be trending up, patient currently asymptomatic.  Likely secondary to increasing renal failure.  Unfortunately even if positive for DVT or PE given her poor creatinine clearance, thrombocytopenia, would be extremely hesitant to anticoagulate patient. ? ?  ?Hypothermia: ?-Likely due to sepsis ?-Resolved. ? ?Chronic combined systolic and diastolic CHF ?- Last 2D echo showed an EF of 50% cardiology was concerned about cardiac amyloidosis as her work-up showed abnormal light chain she underwent bone marrow biopsy that showed plasmacytosis (concerned about amyloid disease). ?-04/30/2021 Cardiac MRI showed no evidence of cardiac amyloid ?-Strict in and out -886.5 ml ?- Daily weight ?Filed Weights  ? 12/23/21 1230 12/27/21 0500 12/28/21 0418  ?Weight: 93.7 kg 94.3 kg 98.2 kg  ?- Coreg 3.125 mg BID ?- 3/15 Amlodipine 5 mg daily ?-3/15 Hydralazine PRN ? ?Chronic atrial fibrillation: ?- See CHF ?- After lengthy discussion with pharmacy patient a poor candidate for Lovenox and DOAC (could be  discharged on apixaban 5 mg daily), given her poor creatinine clearance, thrombocytopenia. ? ?Prolonged QT interval: ?-Mildly prolonged corrected.   ? ?Essential HTN ?- See CHF ?  ?  ?Chronic kidney disease stage V: ?-Not a candidate for dialysis.  Per patient her nephrologist was the one who informed her that she  is not a candidate for HD. ?-Renal function continues to deteriorate despite IV fluid hydration. ?- 3/15 the patient endorses want to be a full code I  explained to her that aggressive interventions are not going to restore her to a meaningful level of function and that her renal function will continue to deteriorate, she will probably go into respiratory distress or uremic encephalopathy. ?-KVO IV fluids as she might go into pulmonary edema ?Lab Results  ?Component Value Date  ? CREATININE 5.53 (H) 12/29/2021  ? CREATININE 5.42 (H) 12/28/2021  ? CREATININE 5.36 (H) 12/27/2021  ? CREATININE 4.55 (H) 12/26/2021  ? CREATININE 5.42 (H) 12/25/2021  ? ?  ?Abnormal thyroid function: ?- 3/11 TSH= 7.6 ?-3/16 Free T4 =0.65  ?-Sick euthyroid syndrome? ? ?Anemia of chronic renal disease: ?-Drop in hemoglobin to 6.6 most likely lab error. ?- 3/12 transfuse 2 units PRBC  ?-3/15 Anemia panel pending ?Lab Results  ?Component Value Date  ? HGB 7.5 (L) 12/29/2021  ? HGB 8.0 (L) 12/28/2021  ? HGB 8.0 (L) 12/27/2021  ? HGB 8.2 (L) 12/26/2021  ? HGB 8.4 (L) 12/25/2021  ?3/18 fecal occult blood pending ?3/18 no obvious signs of bleeding. ? ?Thrombocytopenia: ?-Likely due to sepsis physiology, PT and INR unremarkable, will continue to follow ?-Stable ? ? ?Hypocalcemia ?-3/15 corrected calcium= 8.5 ?- 3/15 calcium gluconate 2 g ? ?Goals of care ?- 3/15 PT/OT consult: Multisystem organ failure, deconditioned, evaluate patient for CIR vs SNF ?-3/16 consult TOC SNF placement ?-3/17 reconsult PT/OT: Multisystem organ failure, deconditioned, evaluate patient for SNF and indicate in documentation appropriate for SNF ?  ?  ?Ethics: ?Extremely poor prognosis.  Family met with palliative care the patient has not emotional things which she is getting a discussion.  Palliative care to meet with family again either today or tomorrow. ?Patient wants to be full code ? ? ? ?DVT prophylaxis: None given patient's creatinine clearance, MMP. ?Code Status:  Full ?Family Communication: 3/18 daughter and son-in-law at bedside for discussion plan of care all questions answered ?Status is: Inpatient ? ? ? ?Dispo: The patient is from: Home ?             Anticipated d/c is to: SNF ?             Anticipated d/c date is: 3 days ?             Patient currently is medically stable to d/c. ? ? ? ? ? ?Consultants:  ?Palliative care ? ? ?Procedures/Significant Events:  ?3/12 transfuse 2 units PRBC  ? ? ? ? ?I have personally reviewed and interpreted all radiology studies and my findings are as above. ? ?VENTILATOR SETTINGS: ? ? ? ?Cultures ?3/11 influenza A/B negative ?3/11 SARS coronavirus positive ? ? ?Antimicrobials: ?Anti-infectives (From admission, onward)  ? ? Start     Ordered Stop  ? 12/23/21 1000  remdesivir 100 mg in sodium chloride 0.9 % 100 mL IVPB       ?See Hyperspace for full Linked Orders Report.  ? 12/22/21 0322 12/24/21 1023  ? 12/22/21 0430  remdesivir 100 mg in sodium chloride 0.9 % 100 mL IVPB       ?  See Hyperspace for full Linked Orders Report.  ? 12/22/21 0322 12/22/21 0439  ? 12/22/21 0400  remdesivir 100 mg in sodium chloride 0.9 % 100 mL IVPB       ?See Hyperspace for full Linked Orders Report.  ? 12/22/21 0322 12/22/21 0439  ? 12/22/21 0045  vancomycin (VANCOCIN) IVPB 1000 mg/200 mL premix       ? 12/22/21 0033 12/22/21 0143  ? 12/22/21 0045  ceFEPIme (MAXIPIME) 2 g in sodium chloride 0.9 % 100 mL IVPB       ? 12/22/21 0033 12/22/21 0106  ? ?  ?  ? ? ?Devices ?  ? ?LINES / TUBES:  ? ? ? ? ?Continuous Infusions: ? sodium chloride 250 mL (12/26/21 0938)  ? ? ? ?Objective: ?Vitals:  ? 12/28/21 2111 12/29/21 0649 12/29/21 1454 12/29/21 1508  ?BP: 129/69 135/68 (!) 88/52 123/66  ?Pulse: 62 63 (!) 49 (!) 52  ?Resp: _0 ?Temp: 98.1 ?F (36.7 ?C) 97.8 ?F (36.6 ?C)  (!) 97.2 ?F (36.2 ?C)  ?TempSrc: Oral Oral    ?SpO2: 100% 100% 100% (!) 75%  ?Weight:      ?Height:      ? ? ?Intake/Output Summary (Last 24 hours) at 12/29/2021 2001 ?Last data filed at  12/29/2021 1547 ?Gross per 24 hour  ?Intake --  ?Output 700 ml  ?Net -700 ml  ? ? ?Filed Weights  ? 12/23/21 1230 12/27/21 0500 12/28/21 0418  ?Weight: 93.7 kg 94.3 kg 98.2 kg  ? ? ?Examination: ? ?General: A/O x

## 2021-12-30 DIAGNOSIS — T68XXXA Hypothermia, initial encounter: Secondary | ICD-10-CM | POA: Diagnosis not present

## 2021-12-30 DIAGNOSIS — N185 Chronic kidney disease, stage 5: Secondary | ICD-10-CM | POA: Diagnosis not present

## 2021-12-30 DIAGNOSIS — I482 Chronic atrial fibrillation, unspecified: Secondary | ICD-10-CM | POA: Diagnosis not present

## 2021-12-30 DIAGNOSIS — I5042 Chronic combined systolic (congestive) and diastolic (congestive) heart failure: Secondary | ICD-10-CM | POA: Diagnosis not present

## 2021-12-30 DIAGNOSIS — I959 Hypotension, unspecified: Secondary | ICD-10-CM | POA: Diagnosis not present

## 2021-12-30 DIAGNOSIS — I9589 Other hypotension: Secondary | ICD-10-CM

## 2021-12-30 LAB — CBC WITH DIFFERENTIAL/PLATELET
Abs Immature Granulocytes: 0.01 10*3/uL (ref 0.00–0.07)
Basophils Absolute: 0 10*3/uL (ref 0.0–0.1)
Basophils Relative: 0 %
Eosinophils Absolute: 0.1 10*3/uL (ref 0.0–0.5)
Eosinophils Relative: 3 %
HCT: 23.8 % — ABNORMAL LOW (ref 36.0–46.0)
Hemoglobin: 7.3 g/dL — ABNORMAL LOW (ref 12.0–15.0)
Immature Granulocytes: 0 %
Lymphocytes Relative: 23 %
Lymphs Abs: 1 10*3/uL (ref 0.7–4.0)
MCH: 27.8 pg (ref 26.0–34.0)
MCHC: 30.7 g/dL (ref 30.0–36.0)
MCV: 90.5 fL (ref 80.0–100.0)
Monocytes Absolute: 0.4 10*3/uL (ref 0.1–1.0)
Monocytes Relative: 10 %
Neutro Abs: 2.7 10*3/uL (ref 1.7–7.7)
Neutrophils Relative %: 64 %
Platelets: 85 10*3/uL — ABNORMAL LOW (ref 150–400)
RBC: 2.63 MIL/uL — ABNORMAL LOW (ref 3.87–5.11)
RDW: 18.7 % — ABNORMAL HIGH (ref 11.5–15.5)
Smear Review: DECREASED
WBC: 4.2 10*3/uL (ref 4.0–10.5)
nRBC: 0 % (ref 0.0–0.2)

## 2021-12-30 LAB — COMPREHENSIVE METABOLIC PANEL
ALT: 13 U/L (ref 0–44)
AST: 22 U/L (ref 15–41)
Albumin: <1.5 g/dL — ABNORMAL LOW (ref 3.5–5.0)
Alkaline Phosphatase: 94 U/L (ref 38–126)
Anion gap: 9 (ref 5–15)
BUN: 87 mg/dL — ABNORMAL HIGH (ref 8–23)
CO2: 15 mmol/L — ABNORMAL LOW (ref 22–32)
Calcium: 6.8 mg/dL — ABNORMAL LOW (ref 8.9–10.3)
Chloride: 118 mmol/L — ABNORMAL HIGH (ref 98–111)
Creatinine, Ser: 5.59 mg/dL — ABNORMAL HIGH (ref 0.44–1.00)
GFR, Estimated: 8 mL/min — ABNORMAL LOW (ref >60)
Glucose, Bld: 122 mg/dL — ABNORMAL HIGH (ref 70–99)
Potassium: 4.3 mmol/L (ref 3.5–5.1)
Sodium: 142 mmol/L (ref 135–145)
Total Bilirubin: 0.2 mg/dL — ABNORMAL LOW (ref 0.3–1.2)
Total Protein: 4.7 g/dL — ABNORMAL LOW (ref 6.5–8.1)

## 2021-12-30 LAB — PHOSPHORUS: Phosphorus: 8.9 mg/dL — ABNORMAL HIGH (ref 2.5–4.6)

## 2021-12-30 LAB — D-DIMER, QUANTITATIVE: D-Dimer, Quant: 2.47 ug/mL-FEU — ABNORMAL HIGH (ref 0.00–0.50)

## 2021-12-30 LAB — C-REACTIVE PROTEIN: CRP: 7.7 mg/dL — ABNORMAL HIGH (ref <1.0)

## 2021-12-30 LAB — FERRITIN: Ferritin: 551 ng/mL — ABNORMAL HIGH (ref 11–307)

## 2021-12-30 LAB — SEDIMENTATION RATE: Sed Rate: 108 mm/hr — ABNORMAL HIGH (ref 0–22)

## 2021-12-30 LAB — MAGNESIUM: Magnesium: 2.9 mg/dL — ABNORMAL HIGH (ref 1.7–2.4)

## 2021-12-30 MED ORDER — ALBUMIN HUMAN 25 % IV SOLN
50.0000 g | Freq: Once | INTRAVENOUS | Status: AC
  Administered 2021-12-30: 50 g via INTRAVENOUS
  Filled 2021-12-30: qty 200

## 2021-12-30 MED ORDER — MIDODRINE HCL 5 MG PO TABS
5.0000 mg | ORAL_TABLET | Freq: Three times a day (TID) | ORAL | Status: DC
  Administered 2021-12-31 – 2022-01-03 (×10): 5 mg via ORAL
  Filled 2021-12-30 (×11): qty 1

## 2021-12-30 NOTE — [place] FL2 (Signed)
?[place] MEDICAID FL2 LEVEL OF CARE SCREENING TOOL  ?  ? ?IDENTIFICATION  ?Patient Name: ?Kara Mullins Birthdate: [birth year] Sex: female Admission Date (Current Location): ?12/21/2021  ?[place] and [place] Number: ? [place] ?  Facility and Address:  ?[facility],  [place], [place] ?     Provider Number: ?[id number]  ?Attending Physician Name and Address:  ?Allie Bossier, MD ? Relative Name and Phone Number:  ?ADAIA, MATTHIES Daughter   [phone] ?   ?Current Level of Care: ?Hospital Recommended Level of Care: ?[place] Prior Approval Number: ?  ? ?Date Approved/Denied: ?  PASRR Number: ?[id number] A ? ?Discharge Plan: ?SNF ?  ? ?Current Diagnoses: ?Patient Active Problem List  ? Diagnosis Date Noted  ? Pneumonia due to COVID-19 virus 12/26/2021  ? Palliative care by specialist   ? Goals of care, counseling/discussion   ? General weakness   ? Sepsis ([place]) 12/22/2021  ? COVID-19 virus infection 12/22/2021  ? Atrial fibrillation ([place]) 12/22/2021  ? CKD (chronic kidney disease) stage 5, GFR less than 15 ml/min (HCC) 12/22/2021  ? Anemia of chronic kidney failure, stage 5 ([place]) 12/22/2021  ? Prolonged QT interval 12/22/2021  ? Debility 11/20/2021  ? Metabolic acidosis 39/76/7341  ? Severe sepsis ([place]) 11/19/2021  ? Hypothermia 11/09/2021  ? Hyperlipidemia 11/09/2021  ? Chronic combined systolic and diastolic congestive heart failure ([place]) 11/09/2021  ? Stage 3b chronic kidney disease (CKD) ([place]) 11/09/2021  ? Pancytopenia ([place]) 11/09/2021  ? Severe protein-calorie malnutrition ([place]) 11/09/2021  ? Acute combined systolic and diastolic HF (heart failure) ([place])   ? AKI (acute kidney injury) ([place])   ? CHF exacerbation ([place]) 03/06/2021  ? Essential hypertension   ? Hypertensive urgency 03/05/2021  ? Acute CHF (congestive heart failure) ([place]) 03/05/2021  ? Nausea and vomiting 03/05/2021  ? Elevated troponin level not due myocardial infarction 03/05/2021  ? Elevated d-dimer  03/05/2021  ? Generalized anxiety disorder 03/05/2021  ? Acute congestive heart failure ([place]) 03/05/2021  ? ? ?Orientation RESPIRATION BLADDER Height & Weight   ?  ?Self, Time, Situation, Place ? Normal Incontinent Weight: 220 lb (99.8 kg) ?Height:  5\' 5"  (165.1 cm)  ?BEHAVIORAL SYMPTOMS/MOOD NEUROLOGICAL BOWEL NUTRITION STATUS  ?    Incontinent Diet  ?AMBULATORY STATUS COMMUNICATION OF NEEDS Skin   ?Limited Assist Verbally PU Stage and Appropriate Care ?  ?PU Stage 2 Dressing:  (PRN dressing changes) ?  ?    ?     ?     ? ? ?Personal Care Assistance Level of Assistance  ?Bathing, Dressing, Feeding Bathing Assistance: Limited assistance ?Feeding assistance: Independent ?Dressing Assistance: Limited assistance ?   ? ?Functional Limitations Info  ?Hearing, Sight, Speech Sight Info: Adequate ?Hearing Info: Adequate ?Speech Info: Adequate  ? ? ?SPECIAL CARE FACTORS FREQUENCY  ?OT (By licensed OT), PT (By licensed PT)   ?  ?PT Frequency: Minimum 5x a week ?OT Frequency: Minimim 5x a week ?  ?  ?  ?   ? ? ?Contractures    ? ? ?Additional Factors Info  ?Code Status, Allergies, Isolation Precautions Code Status Info: Full Code ?Allergies Info: NKA ?  ?  ?  ?   ? ?Current Medications (12/30/2021):  This is the current hospital active medication list ?Current Facility-Administered Medications  ?Medication Dose Route Frequency Provider Last Rate Last Admin  ? 0.9 %  sodium chloride infusion (Manually program via Guardrails IV Fluids)   Intravenous Once Charlynne Cousins, MD      ?  0.9 %  sodium chloride infusion  250 mL Intravenous PRN Charlynne Cousins, MD 10 mL/hr at 12/26/21 0938 250 mL at 12/26/21 0938  ? acetaminophen (TYLENOL) tablet 650 mg  650 mg Oral Q6H PRN Charlynne Cousins, MD      ? carvedilol (COREG) tablet 3.125 mg  3.125 mg Oral BID WC Charlynne Cousins, MD   3.125 mg at 12/30/21 1001  ? folic acid (FOLVITE) tablet 1 mg  1 mg Oral Daily Charlynne Cousins, MD   1 mg at 12/30/21 1001  ?  guaiFENesin-dextromethorphan (ROBITUSSIN DM) 100-10 MG/5ML syrup 10 mL  10 mL Oral Q6H PRN Charlynne Cousins, MD      ? hydrALAZINE (APRESOLINE) injection 5 mg  5 mg Intravenous Q6H PRN Allie Bossier, MD      ? MEDLINE mouth rinse  15 mL Mouth Rinse BID Charlynne Cousins, MD   15 mL at 12/30/21 1001  ? [START ON 12/31/2021] midodrine (PROAMATINE) tablet 5 mg  5 mg Oral TID WC Allie Bossier, MD      ? multivitamin with minerals tablet 1 tablet  1 tablet Oral Daily Charlynne Cousins, MD   1 tablet at 12/30/21 1001  ? senna-docusate (Senokot-S) tablet 1 tablet  1 tablet Oral QHS PRN Charlynne Cousins, MD      ? sodium chloride flush (NS) 0.9 % injection 3 mL  3 mL Intravenous Q12H Charlynne Cousins, MD   3 mL at 12/30/21 1001  ? sodium chloride flush (NS) 0.9 % injection 3 mL  3 mL Intravenous PRN Charlynne Cousins, MD      ? ? ? ?Discharge Medications: ?Please see discharge summary for a list of discharge medications. ? ?Relevant Imaging Results: ? ?Relevant Lab Results: ? ? ?Additional Information ?Covid + 3/11,  [place] [phone] ? ?Ross Ludwig, LCSW ? ? ? ? ?

## 2021-12-30 NOTE — Progress Notes (Signed)
Physical Therapy Treatment ?Patient Details ?Name: Kara Mullins ?MRN: [id number] ?DOB: 1951/03/14 ?Today's Date: 12/30/2021 ? ? ?History of Present Illness 71 y.o. female with medical history significant of chronic combined systolic and diastolic heart failure, generalized anxiety disorder, hyperlipidemia, hypertension, stage IIIb CKD who was brought to the emergency department due to inability to swallow less than an hour before arrival. While in ED, pt had episode of syncope, found to be hypothermic and evidence suggesting UTI. Dx of sepsis. ? ?  ?PT Comments  ? ? Pt very pleasant and motivated as always but limited by dizziness with attempts to mobilize.  Pt eager to walk and sit up in chair but BP supine 92/58 (no symptoms), sitting 88/57 with mild lightheadedness; stood and stepped to chair - BP attempted in standing but pt requests to sit 2* increasing dizziness - BP on return to sitting 93/52 - RN aware.  ?Recommendations for follow up therapy are one component of a multi-disciplinary discharge planning process, led by the attending physician.  Recommendations may be updated based on patient status, additional functional criteria and insurance authorization. ? ?Follow Up Recommendations ? Skilled nursing-short term rehab (<3 hours/day) ?  ?  ?Assistance Recommended at Discharge Frequent or constant Supervision/Assistance  ?Patient can return home with the following A little help with walking and/or transfers;A little help with bathing/dressing/bathroom;Assistance with cooking/housework;Assist for transportation;Help with stairs or ramp for entrance ?  ?Equipment Recommendations ? None recommended by PT  ?  ?Recommendations for Other Services   ? ? ?  ?Precautions / Restrictions Precautions ?Precautions: Fall ?Precaution Comments: monitor BP- orthostatic ?Restrictions ?Weight Bearing Restrictions: No  ?  ? ?Mobility ? Bed Mobility ?Overal bed mobility: Needs Assistance ?Bed Mobility: Supine to Sit ?  ?   ?Supine to sit: Min assist ?  ?  ?General bed mobility comments: Assist to manage LEs over EOB and to complete rotation to EOB sitting using bed pad ?  ? ?Transfers ?Overall transfer level: Needs assistance ?Equipment used: Rolling walker (2 wheels) ?Transfers: Sit to/from Stand ?Sit to Stand: Min assist, +2 physical assistance, +2 safety/equipment, From elevated surface ?  ?  ?  ?  ?  ?General transfer comment: cues for transition position and use of UEs to self assist.  Physical assist to bring wt up and fwd and to balance in initial standing ?  ? ?Ambulation/Gait ?Ambulation/Gait assistance: Min assist, +2 safety/equipment ?Gait Distance (Feet): 3 Feet ?Assistive device: Rolling walker (2 wheels) ?Gait Pattern/deviations: Step-to pattern, Decreased step length - right, Decreased step length - left, Shuffle, Trunk flexed, Wide base of support ?Gait velocity: decr ?  ?  ?General Gait Details: Assist for balance/support and RW management.  Cues for posture and position from RW.  Distance ltd by fatigue and c/o increasing dizziness ? ? ?Stairs ?  ?  ?  ?  ?  ? ? ?Wheelchair Mobility ?  ? ?Modified Rankin (Stroke Patients Only) ?  ? ? ?  ?Balance Overall balance assessment: Needs assistance ?Sitting-balance support: Feet supported, No upper extremity supported ?Sitting balance-Leahy Scale: Good ?  ?  ?Standing balance support: During functional activity, Reliant on assistive device for balance ?Standing balance-Leahy Scale: Poor ?  ?  ?  ?  ?  ?  ?  ?  ?  ?  ?  ?  ?  ? ?  ?Cognition Arousal/Alertness: Awake/alert ?Behavior During Therapy: [facility] for tasks assessed/performed ?Overall Cognitive Status: Within Functional Limits for tasks assessed ?  ?  ?  ?  ?  ?  ?  ?  ?  ?  ?  ?  ?  ?  ?  ?  ?  ?  ?  ? ?  ?  Exercises   ? ?  ?General Comments   ?  ?  ? ?Pertinent Vitals/Pain Pain Assessment ?Pain Assessment: No/denies pain  ? ? ?Home Living   ?  ?  ?  ?  ?  ?  ?  ?  ?  ?   ?  ?Prior Function    ?  ?  ?   ? ?PT Goals  (current goals can now be found in the care plan section) Acute Rehab PT Goals ?Patient Stated Goal: HOME; regain as much IND as possible to not e a burden on her dtr ?PT Goal Formulation: With patient ?Time For Goal Achievement: 01/10/22 ?Potential to Achieve Goals: Fair ?Progress towards PT goals: Not progressing toward goals - comment (dizziness; low BP) ? ?  ?Frequency ? ? ? Min 3X/week ? ? ? ?  ?PT Plan Discharge plan needs to be updated  ? ? ?Co-evaluation   ?  ?  ?  ?  ? ?  ?AM-PAC PT "6 Clicks" Mobility   ?Outcome Measure ? Help needed turning from your back to your side while in a flat bed without using bedrails?: A Lot ?Help needed moving from lying on your back to sitting on the side of a flat bed without using bedrails?: A Little ?Help needed moving to and from a bed to a chair (including a wheelchair)?: A Lot ?Help needed standing up from a chair using your arms (e.g., wheelchair or bedside chair)?: A Lot ?Help needed to walk in hospital room?: Total ?Help needed climbing 3-5 steps with a railing? : Total ?6 Click Score: 11 ? ?  ?End of Session Equipment Utilized During Treatment: Gait belt ?Activity Tolerance: Patient limited by fatigue;Other (comment) (dizziness) ?Patient left: in chair;with call bell/phone within reach;with chair alarm set ?Nurse Communication: Mobility status ?PT Visit Diagnosis: Difficulty in walking, not elsewhere classified (R26.2);Muscle weakness (generalized) (M62.81) ?  ? ? ?Time: 1440-1500 ?PT Time Calculation (min) (ACUTE ONLY): 20 min ? ?Charges:  $Therapeutic Activity: 8-22 mins          ?          ? ?Debe Coder PT ?Acute Rehabilitation Services ?Pager [phone] ?Office [phone] ? ? ? ?Stormy Connon ?12/30/2021, 3:18 PM ? ?

## 2021-12-30 NOTE — TOC Progression Note (Signed)
Transition of Care (TOC) - Progression Note  ? ? ?Patient Details  ?Name: Kara Mullins ?MRN: [id number] ?Date of Birth: 09-05-1951 ? ?Transition of Care (TOC) CM/SW Contact  ?Ross Ludwig, LCSW ?Phone Number: ?12/30/2021, 6:12 PM ? ?Clinical Narrative:    ? ?CIR reviewed patient and they feel SNF with palliative more appropriate.  CSW sent patient's information to SNFs in [facility] for review.  Awaiting on bed offers.  Patient was Covid+ on 3/11, patient will not be able to go to SNF until 3/21 and will need insurance authorization.  TOC to continue to follow patient's progress throughout discharge planning. ? ?Expected Discharge Plan:  (TBD) ?Barriers to Discharge: Continued Medical Work up ? ?Expected Discharge Plan and Services ?Expected Discharge Plan:  (TBD) ?  ?  ?  ?  ?                ?  ?  ?  ?  ?  ?  ?  ?  ?  ?  ? ? ?Social Determinants of Health (SDOH) Interventions ?  ? ?Readmission Risk Interventions ?Readmission Risk Prevention Plan 12/23/2021  ?Transportation Screening Complete  ?PCP or Specialist Appt within 3-5 Days Complete  ?[place] or Home Care Consult Complete  ?Social Work Consult for [place] Planning/Counseling Complete  ?Palliative Care Screening Not Applicable  ?Medication Review Press photographer) Complete  ?Some recent data might be hidden  ? ? ?

## 2021-12-30 NOTE — Progress Notes (Signed)
?PROGRESS NOTE ? ? ? ?Kara Mullins  QAS:[id number] DOB: Apr 18, 1951 DOA: 12/21/2021 ?PCP: [name], [name], NP  ? ?Brief Narrative:  ?Kara Mullins is an 71 y.o. 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 Eventually from SNF was discharged home daughter, relates that the day prior to admission she had a low blood pressure and a variable heart rate she was concerned she might be getting septic again, brought  her in the emergency room was found to be hypothermic and bradycardic placed on a Bear hugger work-up revealed she was COVID-positive chest x-ray showed bilateral infiltratesd,was started on IV remdesivir and IV heparin infusion as she was found to be in A-fib with RVR ? ? ?Subjective: ?3/19 afebrile overnight A/O x4.  Sitting in chair comfortably ? ? ?Assessment & Plan: ? Covid vaccination; ? ?Principal Problem: ?  Sepsis ([place]) ?Active Problems: ?  COVID-19 virus infection ?  Hypothermia ?  Atrial fibrillation ([place]) ?  CKD (chronic kidney disease) stage 5, GFR less than 15 ml/min (HCC) ?  Chronic combined systolic and diastolic congestive heart failure ([place]) ?  Anemia of chronic kidney failure, stage 5 (HCC) ?  Prolonged QT interval ?  Palliative care by specialist ?  Goals of care, counseling/discussion ?  General weakness ?  Pneumonia due to COVID-19 virus ?  Hypotension, unspecified ? ? ?Sepsis/ COVID-19 pneumonia: ?-Daughter relates she tested positive at the facility on 11/27/2021. ?-Completed 2 days of Remdesivir then was discontinued.  Patient on room air inflammatory markers stable will not restart to complete 5-day course.   ?-Palliative care discussed with family  transitioning to hospice. ?-Patient relates that she wants to continues to be a full code and wants full aggressive treatment she does not seems to understand the gravity of the situation, daughter is helping make the mother understand that her condition will continue to deteriorate despite treatment. ?-3/16 resolved ? ?COVID-19 Labs ? ?Recent Labs  ?  12/28/21 ?0400 12/29/21 ?7989 12/30/21 ?0414  ?DDIMER 2.25* 2.28* 2.47*  ?FERRITIN 482* 551* 551*  ?CRP 7.3* 7.9* 7.7*  ? ? ?Lab Results  ?Component Value Date  ? SARSCOV2NAA POSITIVE (A) 12/22/2021  ? [place] NEGATIVE 11/09/2021  ? [place] NEGATIVE 03/05/2021  ? ?-3/17 D-dimer may be trending up, patient currently asymptomatic.  Likely secondary to increasing renal failure.  Unfortunately even if positive for DVT or PE given her poor creatinine clearance, thrombocytopenia, would be extremely hesitant to anticoagulate patient. ? ?  ?Hypothermia: ?-Likely due to sepsis ?-Resolved. ? ?Chronic combined systolic and diastolic CHF/Hypotension ?- Last 2D echo showed an EF of 50% cardiology was concerned about cardiac amyloidosis as her work-up showed abnormal light chain she underwent bone marrow biopsy that showed plasmacytosis (concerned about amyloid disease). ?-04/30/2021 Cardiac MRI showed no evidence of cardiac amyloid ?-Strict in and out -886.5 ml ?- Daily weight ?Filed Weights  ? 12/27/21 0500 12/28/21 0418 12/30/21 0500  ?Weight: 94.3 kg 98.2 kg 99.8 kg  ?- Coreg 3.125 mg BID (hold) ?-3/15 Hydralazine PRN ?- 3/19 DC Amlodipine 5 mg daily ?-3/19 Midodrine 5 mg TID ?-3/19 orthostatic vitals q shift ?-3/19 Albumin 50 g ? ?Chronic atrial fibrillation: ?- See CHF ?- After lengthy  discussion with pharmacy patient a poor candidate for Lovenox and DOAC (could be discharged on apixaban 5 mg daily), given her poor creatinine clearance, thrombocytopenia. ? ?Prolonged QT interval: ?-Mildly prolonged corrected.   ? ?Essential HTN ?- See CHF ?  ?  ?Chronic kidney disease  stage V: ?-Not a candidate for dialysis.  Per patient her nephrologist was the one who informed her that she is not a candidate for HD. ?-Renal function continues to deteriorate despite IV fluid hydration. ?- 3/15 the patient endorses want to be a full code I  explained to her that aggressive interventions are not going to restore her to a meaningful level of function and that her renal function will continue to deteriorate, she will probably go into respiratory distress or uremic encephalopathy. ?-KVO IV fluids as she might go into pulmonary edema ?Lab Results  ?Component Value Date  ? CREATININE 5.59 (H) 12/30/2021  ? CREATININE 5.53 (H) 12/29/2021  ? CREATININE 5.42 (H) 12/28/2021  ? CREATININE 5.36 (H) 12/27/2021  ? CREATININE 4.55 (H) 12/26/2021  ?-3/19 continuing to worsen kidney function ?  ?Abnormal thyroid function: ?- 3/11 TSH= 7.6 ?-3/16 Free T4 =0.65  ?-Sick euthyroid syndrome? ? ?Anemia of chronic renal disease: ?-Drop in hemoglobin to 6.6 most likely lab error. ?- 3/12 transfuse 2 units PRBC  ?-3/15 Anemia panel pending ?Lab Results  ?Component Value Date  ? HGB 7.3 (L) 12/30/2021  ? HGB 7.5 (L) 12/29/2021  ? HGB 8.0 (L) 12/28/2021  ? HGB 8.0 (L) 12/27/2021  ? HGB 8.2 (L) 12/26/2021  ?3/18 fecal occult blood pending ?3/18 no obvious signs of bleeding. ? ?Thrombocytopenia: ?-Likely due to sepsis physiology, PT and INR unremarkable, will continue to follow ?-Stable ? ?Hypocalcemia ?-3/15 corrected calcium= 8.5 ?- 3/15 calcium gluconate 2 g ? ?Goals of care ?- 3/15 PT/OT consult: Multisystem organ failure, deconditioned, evaluate patient for CIR vs SNF ?-3/16 consult TOC SNF placement ?-3/17 reconsult PT/OT: Multisystem organ failure, deconditioned, evaluate patient for SNF and indicate in documentation appropriate for SNF ?  ?  ?Ethics: ?Extremely poor prognosis.  Family met with palliative care the patient has not emotional things which she is getting a discussion.  Palliative care to meet with family  again either today or tomorrow. ?Patient wants to be full code ? ? ? ?DVT prophylaxis: None given patient's creatinine clearance, MMP. ?Code Status: Full ?Family Communication: 3/19 daughter and son-in-law at bedside for discussion plan of care all questions answered ?Status is: Inpatient ? ? ? ?Dispo: The patient is from: Home ?             Anticipated d/c is to: SNF ?             Anticipated d/c date is: 3 days ?             Patient currently is medically stable to d/c. ? ? ? ? ? ?Consultants:  ?Palliative care ? ? ?Procedures/Significant Events:  ?3/12 transfuse 2 units PRBC  ? ? ? ? ?I have personally reviewed and interpreted all radiology studies and my findings are as above. ? ?VENTILATOR SETTINGS: ? ? ? ?Cultures ?3/11 influenza A/B negative ?3/11 SARS coronavirus positive ? ? ?Antimicrobials: ?Anti-infectives (From admission, onward)  ? ? Start     Ordered Stop  ? 12/23/21 1000  remdesivir 100 mg in sodium chloride 0.9 % 100 mL IVPB       ?See Hyperspace for full Linked Orders Report.  ? 12/22/21 0322 12/24/21 1023  ? 12/22/21 0430  remdesivir 100 mg in sodium chloride 0.9 % 100 mL IVPB       ?See Hyperspace for full Linked Orders Report.  ? 12/22/21 0322 12/22/21 0439  ? 12/22/21 0400  remdesivir 100 mg in sodium chloride 0.9 % 100 mL IVPB       ?See Hyperspace for full Linked Orders Report.  ? 12/22/21 0322 12/22/21 0439  ? 12/22/21 0045  vancomycin (VANCOCIN) IVPB 1000 mg/200 mL premix       ? 12/22/21 0033 12/22/21 0143  ? 12/22/21 0045  ceFEPIme (MAXIPIME) 2 g in sodium chloride 0.9 % 100 mL IVPB       ? 12/22/21 0033 12/22/21 0106  ? ?  ?  ? ? ?Devices ?  ? ?LINES / TUBES:  ? ? ? ? ?Continuous Infusions: ? sodium chloride 250 mL (12/26/21 0938)  ? albumin human    ? ? ? ?Objective: ?Vitals:  ? 12/30/21 0500 12/30/21 0503 12/30/21 1237 12/30/21 1833  ?BP:  137/75 113/65 115/61  ?Pulse:  (!) 53 (!) 51 (!) 46  ?Resp:  16 18   ?Temp:  (!) 97.3 ?F (36.3 ?C) 97.6 ?F (36.4 ?C)   ?TempSrc:      ?SpO2:  100%  100%   ?Weight: 99.8 kg     ?Height:      ? ? ?Intake/Output Summary (Last 24 hours) at 12/30/2021 1956 ?Last data filed at 12/30/2021 1900 ?Gross per 24 hour  ?Intake 360 ml  ?Output 600 ml  ?Net -240 ml

## 2021-12-31 DIAGNOSIS — I482 Chronic atrial fibrillation, unspecified: Secondary | ICD-10-CM | POA: Diagnosis not present

## 2021-12-31 DIAGNOSIS — I5042 Chronic combined systolic (congestive) and diastolic (congestive) heart failure: Secondary | ICD-10-CM | POA: Diagnosis not present

## 2021-12-31 DIAGNOSIS — T68XXXA Hypothermia, initial encounter: Secondary | ICD-10-CM | POA: Diagnosis not present

## 2021-12-31 DIAGNOSIS — N185 Chronic kidney disease, stage 5: Secondary | ICD-10-CM | POA: Diagnosis not present

## 2021-12-31 LAB — COMPREHENSIVE METABOLIC PANEL
ALT: 19 U/L (ref 0–44)
AST: 34 U/L (ref 15–41)
Albumin: 2.1 g/dL — ABNORMAL LOW (ref 3.5–5.0)
Alkaline Phosphatase: 94 U/L (ref 38–126)
Anion gap: 7 (ref 5–15)
BUN: 86 mg/dL — ABNORMAL HIGH (ref 8–23)
CO2: 15 mmol/L — ABNORMAL LOW (ref 22–32)
Calcium: 6.9 mg/dL — ABNORMAL LOW (ref 8.9–10.3)
Chloride: 118 mmol/L — ABNORMAL HIGH (ref 98–111)
Creatinine, Ser: 5.68 mg/dL — ABNORMAL HIGH (ref 0.44–1.00)
GFR, Estimated: 8 mL/min — ABNORMAL LOW (ref >60)
Glucose, Bld: 116 mg/dL — ABNORMAL HIGH (ref 70–99)
Potassium: 4.1 mmol/L (ref 3.5–5.1)
Sodium: 140 mmol/L (ref 135–145)
Total Bilirubin: 0.2 mg/dL — ABNORMAL LOW (ref 0.3–1.2)
Total Protein: 5.2 g/dL — ABNORMAL LOW (ref 6.5–8.1)

## 2021-12-31 LAB — CBC WITH DIFFERENTIAL/PLATELET
Abs Immature Granulocytes: 0.02 10*3/uL (ref 0.00–0.07)
Basophils Absolute: 0 10*3/uL (ref 0.0–0.1)
Basophils Relative: 0 %
Eosinophils Absolute: 0.1 10*3/uL (ref 0.0–0.5)
Eosinophils Relative: 2 %
HCT: 22.4 % — ABNORMAL LOW (ref 36.0–46.0)
Hemoglobin: 6.9 g/dL — CL (ref 12.0–15.0)
Immature Granulocytes: 0 %
Lymphocytes Relative: 18 %
Lymphs Abs: 0.8 10*3/uL (ref 0.7–4.0)
MCH: 27.6 pg (ref 26.0–34.0)
MCHC: 30.8 g/dL (ref 30.0–36.0)
MCV: 89.6 fL (ref 80.0–100.0)
Monocytes Absolute: 0.4 10*3/uL (ref 0.1–1.0)
Monocytes Relative: 9 %
Neutro Abs: 3.3 10*3/uL (ref 1.7–7.7)
Neutrophils Relative %: 71 %
Platelets: 109 10*3/uL — ABNORMAL LOW (ref 150–400)
RBC: 2.5 MIL/uL — ABNORMAL LOW (ref 3.87–5.11)
RDW: 18.9 % — ABNORMAL HIGH (ref 11.5–15.5)
WBC: 4.7 10*3/uL (ref 4.0–10.5)
nRBC: 0 % (ref 0.0–0.2)

## 2021-12-31 LAB — HEMOGLOBIN AND HEMATOCRIT, BLOOD
HCT: 29.3 % — ABNORMAL LOW (ref 36.0–46.0)
Hemoglobin: 9.3 g/dL — ABNORMAL LOW (ref 12.0–15.0)

## 2021-12-31 LAB — D-DIMER, QUANTITATIVE: D-Dimer, Quant: 2.18 ug/mL-FEU — ABNORMAL HIGH (ref 0.00–0.50)

## 2021-12-31 LAB — MAGNESIUM: Magnesium: 3.1 mg/dL — ABNORMAL HIGH (ref 1.7–2.4)

## 2021-12-31 LAB — PREPARE RBC (CROSSMATCH)

## 2021-12-31 LAB — FERRITIN: Ferritin: 605 ng/mL — ABNORMAL HIGH (ref 11–307)

## 2021-12-31 LAB — C-REACTIVE PROTEIN: CRP: 7.7 mg/dL — ABNORMAL HIGH (ref <1.0)

## 2021-12-31 LAB — PHOSPHORUS: Phosphorus: 9.8 mg/dL — ABNORMAL HIGH (ref 2.5–4.6)

## 2021-12-31 LAB — SEDIMENTATION RATE: Sed Rate: 110 mm/hr — ABNORMAL HIGH (ref 0–22)

## 2021-12-31 MED ORDER — SODIUM CHLORIDE 0.9% IV SOLUTION: Freq: Once | INTRAVENOUS | Status: DC

## 2021-12-31 NOTE — Care Management Important Message (Signed)
Important Message ? ?Patient Details IM Letter placed in Patients room. ?Name: Kara Mullins ?MRN: [id number] ?Date of Birth: 1951/01/24 ? ? ?Medicare Important Message Given:  Yes ? ? ? ? ?[name] ?12/31/2021, 11:42 AM ?

## 2021-12-31 NOTE — Progress Notes (Signed)
?PROGRESS NOTE ? ? ? ?Kara Mullins  KCM:[id number] DOB: [birth year] DOA: 12/21/2021 ?PCP: [name], [name], NP  ? ?Brief Narrative:  ?Kara Mullins is an 71 y.o. 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 Eventually from SNF was discharged home daughter, relates that the day prior to admission she had a low blood pressure and a variable heart rate she was concerned she might be getting septic again, brought  her in the emergency room was found to be hypothermic and bradycardic placed on a Bear hugger work-up revealed she was COVID-positive chest x-ray showed bilateral infiltratesd,was started on IV remdesivir and IV heparin infusion as she was found to be in A-fib with RVR ? ? ?Subjective: ?3/20 afebrile overnight A/O x4 sitting in bed comfortably ? ? ?Assessment & Plan: ? Covid vaccination; ? ?Principal Problem: ?  Sepsis ([place]) ?Active Problems: ?  COVID-19 virus infection ?  Hypothermia ?  Atrial fibrillation ([place]) ?  CKD (chronic kidney disease) stage 5, GFR less than 15 ml/min (HCC) ?  Chronic combined systolic and diastolic congestive heart failure ([place]) ?  Anemia of chronic kidney failure, stage 5 (HCC) ?  Prolonged QT interval ?  Palliative care by specialist ?  Goals of care, counseling/discussion ?  General weakness ?  Pneumonia due to COVID-19 virus ?  Hypotension, unspecified ? ? ?Sepsis/ COVID-19 pneumonia: ?-Daughter relates she tested positive at the facility on 11/27/2021. ?-Completed 2 days of Remdesivir then was discontinued.  Patient on room air inflammatory markers stable will not restart to complete 5-day course.   ?-Palliative care discussed with family  transitioning to hospice. ?-Patient relates that she wants to continues to be a full code and wants full aggressive treatment she does not seems to understand the gravity of the situation, daughter is helping make the mother understand that her condition will continue to deteriorate despite treatment. ?-3/16 resolved ? ?COVID-19 Labs ? ?Recent Labs  ?  12/29/21 ?0569 12/30/21 ?0414 12/31/21 ?0422  ?DDIMER 2.28* 2.47* 2.18*  ?FERRITIN 551* 551* 605*  ?CRP 7.9* 7.7* 7.7*  ? ? ? ?Lab Results  ?Component Value Date  ? SARSCOV2NAA POSITIVE (A) 12/22/2021  ? [place] NEGATIVE 11/09/2021  ? [place] NEGATIVE 03/05/2021  ?-3/17 D-dimer may be trending up, patient currently asymptomatic.  Likely secondary to increasing renal failure.  Unfortunately even if positive for DVT or PE given her poor creatinine clearance, thrombocytopenia, would be extremely hesitant to anticoagulate patient. ? ?  ?Hypothermia: ?-Likely due to sepsis ?-Resolved. ? ?Chronic combined systolic and diastolic CHF/Hypotension ?- Last 2D echo showed an EF of 50% cardiology was concerned about cardiac amyloidosis as her work-up showed abnormal light chain she underwent bone marrow biopsy that showed plasmacytosis (concerned about amyloid disease). ?-04/30/2021 Cardiac MRI showed no evidence of cardiac amyloid ?-Strict in and out -886.5 ml ?- Daily weight ?Filed Weights  ? 12/27/21 0500 12/28/21 0418 12/30/21 0500  ?Weight: 94.3 kg 98.2 kg 99.8 kg  ?- Coreg 3.125 mg BID (hold) ?-3/15 Hydralazine PRN ?- 3/19 DC Amlodipine 5 mg daily ?-3/19 Midodrine 5 mg TID ?-3/19 orthostatic vitals q shift ?-3/19 Albumin 50 g ? ?Chronic atrial fibrillation: ?- See CHF ?- After lengthy discussion  with pharmacy patient a poor candidate for Lovenox and DOAC (could be discharged on apixaban 5 mg daily), given her poor creatinine clearance, thrombocytopenia. ? ?Prolonged QT interval: ?-Mildly prolonged corrected.   ? ?Essential HTN ?- See CHF ?  ?  ?Chronic kidney disease  stage V: ?-Not a candidate for dialysis.  Per patient her nephrologist was the one who informed her that she is not a candidate for HD. ?-Renal function continues to deteriorate despite IV fluid hydration. ?- 3/15 the patient endorses want to be a full code I  explained to her that aggressive interventions are not going to restore her to a meaningful level of function and that her renal function will continue to deteriorate, she will probably go into respiratory distress or uremic encephalopathy. ?-KVO IV fluids as she might go into pulmonary edema ?Lab Results  ?Component Value Date  ? CREATININE 5.63 (H) 01/01/2022  ? CREATININE 5.68 (H) 12/31/2021  ? CREATININE 5.59 (H) 12/30/2021  ? CREATININE 5.53 (H) 12/29/2021  ? CREATININE 5.42 (H) 12/28/2021  ?-3/19 continuing to worsen kidney function ?  ?Abnormal thyroid function: ?- 3/11 TSH= 7.6 ?-3/16 Free T4 =0.65  ?-Sick euthyroid syndrome? ? ?Anemia of chronic renal disease: ?-Drop in hemoglobin to 6.6 most likely lab error. ?- 3/12 transfuse 2 units PRBC  ?-3/15 Anemia panel pending ?Lab Results  ?Component Value Date  ? HGB 6.9 (LL) 12/31/2021  ? HGB 7.3 (L) 12/30/2021  ? HGB 7.5 (L) 12/29/2021  ? HGB 8.0 (L) 12/28/2021  ? HGB 8.0 (L) 12/27/2021  ?-3/18 fecal occult blood pending ?-3/18 no obvious signs of bleeding. ?-3/20 transfuse 1 unit PRBC ? ?Thrombocytopenia: ?-Likely due to sepsis physiology, PT and INR unremarkable, will continue to follow ?-Stable ? ?Hypocalcemia ?-3/15 corrected calcium= 8.5 ?- 3/15 calcium gluconate 2 g ? ? ?Goals of care ?- 3/15 PT/OT consult: Multisystem organ failure, deconditioned, evaluate patient for CIR vs SNF ?-3/16 consult TOC SNF placement ?-3/17 reconsult PT/OT: Multisystem organ failure, deconditioned, evaluate patient for SNF and indicate in documentation appropriate for SNF ?  ?  ?Ethics: ?Extremely poor prognosis.  Family met with palliative care the patient has not emotional things which she is getting a discussion.   Palliative care to meet with family again either today or tomorrow. ?Patient wants to be full code ? ? ? ?DVT prophylaxis: None given patient's creatinine clearance, MMP. ?Code Status: Full ?Family Communication: 3/20 daughter and son-in-law at bedside for discussion plan of care all questions answered ?Status is: Inpatient ? ? ? ?Dispo: The patient is from: Home ?             Anticipated d/c is to: SNF ?             Anticipated d/c date is: 3 days ?             Patient currently is medically stable to d/c. ? ? ? ? ? ?Consultants:  ?Palliative care ? ? ?Procedures/Significant Events:  ?3/12 transfuse 2 units PRBC  ?3/20 transfuse 1 unit PRBC ? ? ? ?I have personally reviewed and interpreted all radiology studies and my findings are as above. ? ?VENTILATOR SETTINGS: ? ? ? ?Cultures ?3/11 influenza A/B negative ?3/11 SARS coronavirus positive ? ? ?Antimicrobials: ?Anti-infectives (From admission, onward)  ? ? Start     Ordered Stop  ? 12/23/21 1000  remdesivir 100 mg in sodium chloride 0.9 % 100 mL IVPB       ?See Hyperspace for full Linked Orders Report.  ?  12/22/21 0322 12/24/21 1023  ? 12/22/21 0430  remdesivir 100 mg in sodium chloride 0.9 % 100 mL IVPB       ?See Hyperspace for full Linked Orders Report.  ? 12/22/21 0322 12/22/21 0439  ? 12/22/21 0400  remdesivir 100 mg in sodium chloride 0.9 % 100 mL IVPB       ?See Hyperspace for full Linked Orders Report.  ? 12/22/21 0322 12/22/21 0439  ? 12/22/21 0045  vancomycin (VANCOCIN) IVPB 1000 mg/200 mL premix       ? 12/22/21 0033 12/22/21 0143  ? 12/22/21 0045  ceFEPIme (MAXIPIME) 2 g in sodium chloride 0.9 % 100 mL IVPB       ? 12/22/21 0033 12/22/21 0106  ? ?  ?  ? ? ?Devices ?  ? ?LINES / TUBES:  ? ? ? ? ?Continuous Infusions: ? sodium chloride 250 mL (12/26/21 0938)  ? ? ? ?Objective: ?Vitals:  ? 12/31/21 0800 12/31/21 0900 12/31/21 0922 12/31/21 1108  ?BP:  (!) 145/84 137/73 133/79  ?Pulse:  71 64 (!) 57  ?Resp:  15  16  ?Temp: 98.5 ?F (36.9 ?C) 98.5 ?F (36.9  ?C) 98 ?F (36.7 ?C)   ?TempSrc:  Oral Oral   ?SpO2:  100% 100% 100%  ?Weight:      ?Height:      ? ? ?Intake/Output Summary (Last 24 hours) at 12/31/2021 1835 ?Last data filed at 12/31/2021 1113 ?Gross per 24 ho

## 2021-12-31 NOTE — TOC Progression Note (Signed)
Transition of Care (TOC) - Progression Note  ? ? ?Patient Details  ?Name: Kara Mullins ?MRN: [id number] ?Date of Birth: 1950-12-28 ? ?Transition of Care (TOC) CM/SW Contact  ?[place], LCSW ?Phone Number: ?12/31/2021, 1:42 PM ? ?Clinical Narrative:    ? ?CSW called pt daughter and provided SNF choices. Daughter explains that pt does not want to go back to [facility]; states she will look into [facility] but is requesting [facility] if possible. CSW contacted [facility]; they denied pt.  ? ?1315: CSW called pt daughter and updated that [facility] could not extend bed offer. Daughter inquires about process of having pt return home. CSW explained process of DME and HH being arranged. Daughter indicates that pt would need a hospital bed and a wheelchair at discharge if returning home. Daughter states she will speak more with pt before determining Home with [place] vs SNF. ? ?Expected Discharge Plan:  (TBD) ?Barriers to Discharge: Continued Medical Work up ? ?Expected Discharge Plan and Services ?Expected Discharge Plan:  (TBD) ?  ?  ?  ?  ?                ?  ?  ?  ?  ?  ?  ?  ?  ?  ?  ? ? ?Social Determinants of Health (SDOH) Interventions ?  ? ?Readmission Risk Interventions ?Readmission Risk Prevention Plan 12/23/2021  ?Transportation Screening Complete  ?PCP or Specialist Appt within 3-5 Days Complete  ?[place] or Home Care Consult Complete  ?Social Work Consult for [place] Planning/Counseling Complete  ?Palliative Care Screening Not Applicable  ?Medication Review Press photographer) Complete  ?Some recent data might be hidden  ? ? ?

## 2022-01-01 ENCOUNTER — Telehealth: Payer: Self-pay

## 2022-01-01 DIAGNOSIS — I5042 Chronic combined systolic (congestive) and diastolic (congestive) heart failure: Secondary | ICD-10-CM | POA: Diagnosis not present

## 2022-01-01 DIAGNOSIS — I482 Chronic atrial fibrillation, unspecified: Secondary | ICD-10-CM | POA: Diagnosis not present

## 2022-01-01 DIAGNOSIS — Z7189 Other specified counseling: Secondary | ICD-10-CM | POA: Diagnosis not present

## 2022-01-01 DIAGNOSIS — Z515 Encounter for palliative care: Secondary | ICD-10-CM | POA: Diagnosis not present

## 2022-01-01 DIAGNOSIS — N185 Chronic kidney disease, stage 5: Secondary | ICD-10-CM | POA: Diagnosis not present

## 2022-01-01 DIAGNOSIS — A419 Sepsis, unspecified organism: Secondary | ICD-10-CM | POA: Diagnosis not present

## 2022-01-01 DIAGNOSIS — T68XXXA Hypothermia, initial encounter: Secondary | ICD-10-CM | POA: Diagnosis not present

## 2022-01-01 LAB — COMPREHENSIVE METABOLIC PANEL
ALT: 19 U/L (ref 0–44)
AST: 28 U/L (ref 15–41)
Albumin: 1.7 g/dL — ABNORMAL LOW (ref 3.5–5.0)
Alkaline Phosphatase: 99 U/L (ref 38–126)
Anion gap: 8 (ref 5–15)
BUN: 88 mg/dL — ABNORMAL HIGH (ref 8–23)
CO2: 16 mmol/L — ABNORMAL LOW (ref 22–32)
Calcium: 6.9 mg/dL — ABNORMAL LOW (ref 8.9–10.3)
Chloride: 119 mmol/L — ABNORMAL HIGH (ref 98–111)
Creatinine, Ser: 5.63 mg/dL — ABNORMAL HIGH (ref 0.44–1.00)
GFR, Estimated: 8 mL/min — ABNORMAL LOW (ref >60)
Glucose, Bld: 86 mg/dL (ref 70–99)
Potassium: 4.1 mmol/L (ref 3.5–5.1)
Sodium: 143 mmol/L (ref 135–145)
Total Bilirubin: 0.3 mg/dL (ref 0.3–1.2)
Total Protein: 4.8 g/dL — ABNORMAL LOW (ref 6.5–8.1)

## 2022-01-01 LAB — CBC WITH DIFFERENTIAL/PLATELET
Abs Immature Granulocytes: 0.02 10*3/uL (ref 0.00–0.07)
Basophils Absolute: 0 10*3/uL (ref 0.0–0.1)
Basophils Relative: 0 %
Eosinophils Absolute: 0.1 10*3/uL (ref 0.0–0.5)
Eosinophils Relative: 1 %
HCT: 27 % — ABNORMAL LOW (ref 36.0–46.0)
Hemoglobin: 8.6 g/dL — ABNORMAL LOW (ref 12.0–15.0)
Immature Granulocytes: 0 %
Lymphocytes Relative: 15 %
Lymphs Abs: 0.8 10*3/uL (ref 0.7–4.0)
MCH: 28.3 pg (ref 26.0–34.0)
MCHC: 31.9 g/dL (ref 30.0–36.0)
MCV: 88.8 fL (ref 80.0–100.0)
Monocytes Absolute: 0.5 10*3/uL (ref 0.1–1.0)
Monocytes Relative: 9 %
Neutro Abs: 3.9 10*3/uL (ref 1.7–7.7)
Neutrophils Relative %: 75 %
Platelets: 139 10*3/uL — ABNORMAL LOW (ref 150–400)
RBC: 3.04 MIL/uL — ABNORMAL LOW (ref 3.87–5.11)
RDW: 17.8 % — ABNORMAL HIGH (ref 11.5–15.5)
WBC: 5.3 10*3/uL (ref 4.0–10.5)
nRBC: 0 % (ref 0.0–0.2)

## 2022-01-01 LAB — D-DIMER, QUANTITATIVE: D-Dimer, Quant: 2.02 ug/mL-FEU — ABNORMAL HIGH (ref 0.00–0.50)

## 2022-01-01 LAB — BPAM RBC
Blood Product Expiration Date: 202304142359
ISSUE DATE / TIME: 202303200835
Unit Type and Rh: 6200

## 2022-01-01 LAB — TYPE AND SCREEN
ABO/RH(D): AB POS
Antibody Screen: NEGATIVE
Unit division: 0

## 2022-01-01 LAB — MAGNESIUM: Magnesium: 2.9 mg/dL — ABNORMAL HIGH (ref 1.7–2.4)

## 2022-01-01 LAB — SEDIMENTATION RATE: Sed Rate: 102 mm/hr — ABNORMAL HIGH (ref 0–22)

## 2022-01-01 LAB — PHOSPHORUS: Phosphorus: 10 mg/dL — ABNORMAL HIGH (ref 2.5–4.6)

## 2022-01-01 LAB — C-REACTIVE PROTEIN: CRP: 9.2 mg/dL — ABNORMAL HIGH (ref <1.0)

## 2022-01-01 LAB — FERRITIN: Ferritin: 584 ng/mL — ABNORMAL HIGH (ref 11–307)

## 2022-01-01 NOTE — Progress Notes (Signed)
? ?                                                                                                                                                     ?                                                   ?Daily Progress Note  ? ?Patient Name: Kara Mullins       Date: 01/01/2022 ?DOB: 1951/03/29  Age: 71 y.o. MRN#: [id number] ?Attending Physician: Allie Bossier, MD ?Primary Care Physician: [name], NP ?Admit Date: 12/21/2021 ? ?Reason for Consultation/Follow-up: Establishing goals of care ? ?Subjective: ?I saw and examined Kara Mullins and met with her and her daughter, [name], at the bedside. ? ?We discussed clinical course since I last saw her as well as wishes moving forward in regard to advanced directives.  Concepts specific to code status and rehospitalization discussed.  We discussed difference between a aggressive medical intervention path and a palliative, comfort focused care path.  Values and goals of care important to patient and family were attempted to be elicited. ? ?We reviewed options for care when she leaves the hospital including skilled facility, home with home health, and home with hospice services. ? ?We reviewed particular benefits and expectations for each of these care plans.  Discussed that if goal is to be at home and focus on feeling as well as possible and staying out of the hospital, hospice will be the best option with this goal in mind.  We also discussed, however, that rehab services are not part of covered hospice benefits.  Kara Mullins and her daughter both state that they feel that she needs to have further rehab when she goes home and stated preference will likely be for home with home health. ?  ?Length of Stay: 10 ? ?Current Medications: ?Scheduled Meds:  ? sodium chloride   Intravenous Once  ? sodium chloride   Intravenous Once  ? sodium chloride   Intravenous Once  ? carvedilol  3.125 mg Oral BID WC  ? folic acid  1 mg Oral Daily  ? mouth rinse  15 mL Mouth Rinse BID   ? midodrine  5 mg Oral TID WC  ? multivitamin with minerals  1 tablet Oral Daily  ? sodium chloride flush  3 mL Intravenous Q12H  ? ? ?Continuous Infusions: ? sodium chloride 250 mL (12/26/21 0938)  ? ? ?PRN Meds: ?sodium chloride, acetaminophen, guaiFENesin-dextromethorphan, hydrALAZINE, senna-docusate, sodium chloride flush ? ?Physical Exam         ?General: Alert, awake, in no acute distress.   ?HEENT: No bruits, no goiter, no JVD ?Heart: Regular rate  and rhythm. No murmur appreciated. ?Lungs: Good air movement, clear ?Abdomen: Soft, nontender, nondistended, positive bowel sounds.   ?Ext: No significant edema ?Skin: Warm and dry ?Neuro: Grossly intact, nonfocal.  ? ?Vital Signs: BP (!) 145/71 (BP Location: Right Arm)   Pulse 60   Temp (!) 97.2 ?F (36.2 ?C) (Oral)   Resp 16   Ht _0  (1.651 m)   Wt 95.3 kg   SpO2 100%   BMI 34.95 kg/m?  ?SpO2: SpO2: 100 % ?O2 Device: O2 Device: Room Air ?O2 Flow Rate:   ? ?Intake/output summary:  ?Intake/Output Summary (Last 24 hours) at 01/01/2022 1630 ?Last data filed at 01/01/2022 0645 ?Gross per 24 hour  ?Intake --  ?Output 1050 ml  ?Net -1050 ml  ? ? ?LBM: Last BM Date : 12/31/21 ?Baseline Weight: Weight: 93.7 kg ?Most recent weight: Weight: 95.3 kg ? ?     ?Palliative Assessment/Data: ? ? ? ?Flowsheet Rows   ? ?Flowsheet Row Most Recent Value  ?Intake Tab   ?Referral Department Hospitalist  ?Unit at Time of Referral ICU  ?Palliative Care Primary Diagnosis Sepsis/Infectious Disease  ?Date Notified 12/22/21  ?Palliative Care Type Return patient Palliative Care  ?Reason for referral Clarify Goals of Care  ?Date of Admission 12/21/21  ?Date first seen by Palliative Care 12/22/21  ?# of days Palliative referral response time 0 Day(s)  ?# of days IP prior to Palliative referral 1  ?Clinical Assessment   ?Palliative Performance Scale Score 50%  ?Psychosocial & Spiritual Assessment   ?Palliative Care Outcomes   ?Patient/Family meeting held? Yes  ?Who was at the meeting?  Patient, daughter  ?Palliative Care Outcomes Clarified goals of care  ? ?  ? ? ?Patient Active Problem List  ? Diagnosis Date Noted  ? Hypertensive urgency 03/05/2021  ? Acute CHF (congestive heart failure) ([place]) 03/05/2021  ? Elevated d-dimer 03/05/2021  ? Nausea and vomiting 03/05/2021  ? Elevated troponin level not due myocardial infarction 03/05/2021  ? Generalized anxiety disorder 03/05/2021  ? Sepsis ([employer]) 12/22/2021  ? COVID-19 virus infection 12/22/2021  ? Hypothermia 11/09/2021  ? Atrial fibrillation ([place]) 12/22/2021  ? CKD (chronic kidney disease) stage 5, GFR less than 15 ml/min (HCC) 12/22/2021  ? Chronic combined systolic and diastolic congestive heart failure ([place]) 11/09/2021  ? Anemia of chronic kidney failure, stage 5 ([place]) 12/22/2021  ? Hypotension, unspecified 12/30/2021  ? Pneumonia due to COVID-19 virus 12/26/2021  ? Palliative care by specialist   ? Goals of care, counseling/discussion   ? General weakness   ? Prolonged QT interval 12/22/2021  ? Debility 11/20/2021  ? Metabolic acidosis 47/65/4650  ? Severe sepsis ([place]) 11/19/2021  ? Hyperlipidemia 11/09/2021  ? Stage 3b chronic kidney disease (CKD) ([place]) 11/09/2021  ? Pancytopenia ([place]) 11/09/2021  ? Severe protein-calorie malnutrition ([place]) 11/09/2021  ? Acute combined systolic and diastolic HF (heart failure) ([place])   ? AKI (acute kidney injury) ([place])   ? CHF exacerbation ([place]) 03/06/2021  ? Essential hypertension   ? Acute congestive heart failure ([place]) 03/05/2021  ? ? ?Palliative Care Assessment & Plan  ? ?Patient Profile: ?71 y.o. 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  She was brought to ED after her daughter worried she was getting septic again at home and was found to be COVID-positive with bilateral infiltrates on x-ray.  Started on remdesivir for  COVID and heparin as she was also found to be in A-fib with RVR.  Palliative consulted for goals of care. ?  ? ?Recommendations/Plan: ?Full code/full scope.  Multiple discussions about irreversible nature of her illness have been had with both patient and her family.  They understand this fact, however, she continues to express desire for full code/full scope treatment. ?Discussed discharge options including attempts at rehab versus home hospice support.  At this point, she and her daughter are leaning toward home with home health services with palliative care follow-up as an outpatient. ? ? ?Goals of Care and Additional Recommendations: ?Limitations on Scope of Treatment: Full Scope Treatment ? ?Code Status: ? ?  ?Code Status Orders  ?(From admission, onward)  ?  ? ? ?  ? ?  Start     Ordered  ? 12/22/21 0514  Full code  Continuous       ? 12/22/21 0513  ? ?  ?  ? ?  ? ?Code Status History   ? ? Date Active Date Inactive Code Status Order ID Comments User Context  ? 11/17/2021 1741 11/24/2021 0051 DNR [id number]  Barb Merino, MD Inpatient  ? 11/09/2021 0734 11/17/2021 1741 Full Code [id number]  Reubin Milan, MD Inpatient  ? 03/05/2021 0735 03/15/2021 1931 Full Code [id number]  Vernelle Emerald, MD ED  ? ?  ? ?Advance Directive Documentation   ? ?Flowsheet Row Most Recent Value  ?Type of Advance Directive Living will  ?Pre-existing out of facility DNR order (yellow form or pink MOST form) --  ?"MOST" Form in Place? --  ? ?  ? ? ?Prognosis: ? Unable to determine, but if her kidney function continues to worsen, prognosis will likely be weeks at best. Patient is aware of this possibility and knows that this is essentially an irreversible condition.  ? ?Discharge Planning: ?Home with home health with palliative.  ?Care plan was discussed with patient, daughter and [facility] MD.   ? ?Thank you for allowing the Palliative Medicine Team to assist in the care of this patient. ? ? ?Micheline Rough, MD ? ?Please contact Palliative  Medicine Team phone at [phone] for questions and concerns.  ? ? ? ? ? ?

## 2022-01-01 NOTE — Telephone Encounter (Signed)
Incoming fax received from [facility] [employer] requesting recertification for patients daughter Benjamin Merrihew who is employed at [employer]. ? ?Information is due by 01/20/2022. Provider demographic information filled in and form was placed in [name]'s urgent request folder to complete.  ? ?Initial certification paperwork is available to review under media tab to use as a reference. ? ?Once form is completed it can be given to the administrative staff to fax and send for scanning.   ?

## 2022-01-01 NOTE — TOC Progression Note (Addendum)
Transition of Care (TOC) - Progression Note  ? ? ?Patient Details  ?Name: Kara Mullins ?MRN: [id number] ?Date of Birth: Feb 17, 1951 ? ?Transition of Care (TOC) CM/SW Contact  ?[place], LCSW ?Phone Number: ?01/01/2022, 9:58 AM ? ?Clinical Narrative:    ? ?CSW spoke with pt daughter. She explained she discussed with pt and they would like to have pt return home. Daughter states pt has a 3 in 1 at home but would need a hospital bed and a walker. Pt would need PTAR transport home. Daughter inquires about palliative/hospice services. CSW briefly explains pt cannot have [facility] and hospice at the same time but that palliative services could be provided though would over much less support than hospice. Daughter seems interested in this option as she expresses desire for pt to have PT. CSW spoke with MD and there was some discussion yesterday that daughter seemed interested in the level of support that hospice would provide. Palliative to discuss with pt and pt daughter later this morning.  ? ?5277: CSW notified by PMT that daughter and pt more interested in [facility] with OP palliative. CSW called and confirmed with pt daughter. [facility] with  [facility]/PT/OT/SW. Pt will need hospital bed and rolling walker; MD notified. CSW made referral to [facility] for OP palliative.  Pt has a 3 in 1 at home. CSW ordered rolling walker and hospital bed with adapt.  ? ?Expected Discharge Plan:  (TBD) ?Barriers to Discharge: Continued Medical Work up ? ?Expected Discharge Plan and Services ?Expected Discharge Plan:  (TBD) ?  ?  ?  ?  ?                ?  ?  ?  ?  ?  ?  ?  ?  ?  ?  ? ? ?Social Determinants of Health (SDOH) Interventions ?  ? ?Readmission Risk Interventions ?Readmission Risk Prevention Plan 12/23/2021  ?Transportation Screening Complete  ?PCP or Specialist Appt within 3-5 Days Complete  ?[place] or Home Care Consult Complete  ?Social Work Consult for [place] Planning/Counseling Complete  ?Palliative Care  Screening Not Applicable  ?Medication Review Press photographer) Complete  ?Some recent data might be hidden  ? ? ?

## 2022-01-01 NOTE — Telephone Encounter (Signed)
Noted  

## 2022-01-01 NOTE — Progress Notes (Signed)
PT Cancellation Note ? ?Patient Details ?Name: Pearlena Ow ?MRN: [id number] ?DOB: 11/17/1950 ? ? ?Cancelled Treatment:    Reason Eval/Treat Not Completed: Medical issues which prohibited therapy. Will check back another day ? ? ? ?Doreatha Massed, PT ?Acute Rehabilitation  ?Office: [phone] ?Pager: [phone] ? ?  ?

## 2022-01-01 NOTE — Progress Notes (Signed)
?PROGRESS NOTE ? ? ? ?Kara Mullins  QAS:[id number] DOB: 1950-10-18 DOA: 12/21/2021 ?PCP: [name], [name], NP  ? ?Brief Narrative:  ?Kara Mullins is an 71 y.o. 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 Eventually from SNF was discharged home daughter, relates that the day prior to admission she had a low blood pressure and a variable heart rate she was concerned she might be getting septic again, brought  her in the emergency room was found to be hypothermic and bradycardic placed on a Bear hugger work-up revealed she was COVID-positive chest x-ray showed bilateral infiltratesd,was started on IV remdesivir and IV heparin infusion as she was found to be in A-fib with RVR ? ? ?Subjective: ?3/21 afebrile overnight A/O x4 sitting in bed comfortably ? ? ?Assessment & Plan: ? Covid vaccination; ? ?Principal Problem: ?  Sepsis ([place]) ?Active Problems: ?  COVID-19 virus infection ?  Hypothermia ?  Atrial fibrillation ([place]) ?  CKD (chronic kidney disease) stage 5, GFR less than 15 ml/min (HCC) ?  Chronic combined systolic and diastolic congestive heart failure ([place]) ?  Anemia of chronic kidney failure, stage 5 (HCC) ?  Prolonged QT interval ?  Palliative care by specialist ?  Goals of care, counseling/discussion ?  General weakness ?  Pneumonia due to COVID-19 virus ?  Hypotension, unspecified ? ? ?Sepsis/ COVID-19 pneumonia: ?-Daughter relates she tested positive at the facility on 11/27/2021. ?-Completed 2 days of Remdesivir then was discontinued.  Patient on room air inflammatory markers stable will not restart to complete 5-day course.   ?-Palliative care discussed with family  transitioning to hospice. ?-Patient relates that she wants to continues to be a full code and wants full aggressive treatment she does not seems to understand the gravity of the situation, daughter is helping make the mother understand that her condition will continue to deteriorate despite treatment. ?-3/16 resolved ? ?COVID-19 Labs ? ?Recent Labs  ?  12/30/21 ?0414 12/31/21 ?0422 01/01/22 ?0351  ?DDIMER 2.47* 2.18* 2.02*  ?FERRITIN 551* 605* 584*  ?CRP 7.7* 7.7* 9.2*  ? ? ? ?Lab Results  ?Component Value Date  ? SARSCOV2NAA POSITIVE (A) 12/22/2021  ? [place] NEGATIVE 11/09/2021  ? [place] NEGATIVE 03/05/2021  ? ?-3/17 D-dimer may be trending up, patient currently asymptomatic.  Likely secondary to increasing renal failure.  Unfortunately even if positive for DVT or PE given her poor creatinine clearance, thrombocytopenia, would be extremely hesitant to anticoagulate patient. ? ?  ?Hypothermia: ?-Likely due to sepsis ?-Resolved. ? ?Chronic combined systolic and diastolic CHF/Hypotension ?- Last 2D echo showed an EF of 50% cardiology was concerned about cardiac amyloidosis as her work-up showed abnormal light chain she underwent bone marrow biopsy that showed plasmacytosis (concerned about amyloid disease). ?-04/30/2021 Cardiac MRI showed no evidence of cardiac amyloid ?-Strict in and out -2.3 L ?- Daily weight ?Filed Weights  ? 12/28/21 0418 12/30/21 0500 01/01/22 0500  ?Weight: 98.2 kg 99.8 kg 95.3 kg  ?- Coreg 3.125 mg BID ?-3/15 Hydralazine PRN ?- 3/19 DC Amlodipine 5 mg daily  ?-3/19 Midodrine 5 mg TID ?-3/19 orthostatic vitals q shift ?-3/19 Albumin 50 g ? ?Chronic atrial fibrillation: ?- See CHF ?- After lengthy  discussion with pharmacy patient a poor candidate for Lovenox and DOAC (could be discharged on apixaban 5 mg daily), given her poor creatinine clearance, thrombocytopenia. ?-3/21 currently NSR ? ?Prolonged QT interval: ?-Mildly prolonged corrected.   ? ?Essential HTN ?- See CHF ?  ?  ?Chronic  kidney disease stage V: ?-Not a candidate for dialysis.  Per patient her nephrologist was the one who informed her that she is not a candidate for HD. ?-Renal function continues to deteriorate despite IV fluid hydration. ?- 3/15 the patient endorses want to be a full code I  explained to her that aggressive interventions are not going to restore her to a meaningful level of function and that her renal function will continue to deteriorate, she will probably go into respiratory distress or uremic encephalopathy. ?-KVO IV fluids as she might go into pulmonary edema ?Lab Results  ?Component Value Date  ? CREATININE 5.63 (H) 01/01/2022  ? CREATININE 5.68 (H) 12/31/2021  ? CREATININE 5.59 (H) 12/30/2021  ? CREATININE 5.53 (H) 12/29/2021  ? CREATININE 5.42 (H) 12/28/2021  ?-3/19 continuing to worsen kidney function ?  ?Abnormal thyroid function: ?- 3/11 TSH= 7.6 ?-3/16 Free T4 =0.65  ?-Sick euthyroid syndrome? ? ?Anemia of chronic renal disease: ?-Drop in hemoglobin to 6.6 most likely lab error. ?- 3/12 transfuse 2 units PRBC  ?-3/15 Anemia panel pending ?Lab Results  ?Component Value Date  ? HGB 8.6 (L) 01/01/2022  ? HGB 9.3 (L) 12/31/2021  ? HGB 6.9 (LL) 12/31/2021  ? HGB 7.3 (L) 12/30/2021  ? HGB 7.5 (L) 12/29/2021  ?-3/18 fecal occult blood pending ?-3/18 no obvious signs of bleeding. ?-3/20 transfuse 1 unit PRBC ? ?Thrombocytopenia: ?-Likely due to sepsis physiology, PT and INR unremarkable, will continue to follow ?-Stable ? ?Hypocalcemia ?-3/15 corrected calcium= 8.5 ?- 3/15 calcium gluconate 2 g ? ? ?Goals of care ?- 3/15 PT/OT consult: Multisystem organ failure, deconditioned, evaluate patient for CIR vs SNF ?-3/16 consult TOC SNF placement ?-3/17 reconsult PT/OT: Multisystem organ failure, deconditioned, evaluate patient for SNF and indicate in documentation appropriate for SNF ?-3/21 spoke at length with daughter yesterday and she is going to talk to her mother and they are going to decide if they want to go  home with home hospice.  They would like palliative care to come today and speak with them about home hospice resources available to them.  ADDENDUM.  Have decided on home with palliative care. ? ?  ?Ethics: ?Extremely poor prognosis.  Family met with palliative care the patient has not emotional things which she is getting a discussion.  Palliative care to meet with family again either today or tomorrow. ?Patient wants to be full code ? ? ? ?DVT prophylaxis: None given patient's creatinine clearance, MMP. ?Code Status: Full ?Family Communication: 3/20 daughter and son-in-law at bedside for discussion plan of care all questions answered ?Status is: Inpatient ? ? ? ?Dispo: The patient is from: Home ?             Anticipated d/c is to: SNF ?             Anticipated d/c date is: 3 days ?             Patient currently is medically stable to d/c. ? ? ? ? ? ?Consultants:  ?Palliative care ? ? ?Procedures/Significant Events:  ?3/12 transfuse 2 units PRBC  ?3/20 transfuse 1 unit PRBC ? ? ? ?I have personally reviewed and interpreted all radiology studies and my findings are  as above. ? ?VENTILATOR SETTINGS: ? ? ? ?Cultures ?3/11 influenza A/B negative ?3/11 SARS coronavirus positive ? ? ?Antimicrobials: ?Anti-infectives (From admission, onward)  ? ? Start     Ordered Stop  ? 12/23/21 1000  remdesivir 100 mg in sodium chloride 0.9 % 100 mL IVPB       ?See Hyperspace for full Linked Orders Report.  ? 12/22/21 0322 12/24/21 1023  ? 12/22/21 0430  remdesivir 100 mg in sodium chloride 0.9 % 100 mL IVPB       ?See Hyperspace for full Linked Orders Report.  ? 12/22/21 0322 12/22/21 0439  ? 12/22/21 0400  remdesivir 100 mg in sodium chloride 0.9 % 100 mL IVPB       ?See Hyperspace for full Linked Orders Report.  ? 12/22/21 0322 12/22/21 0439  ? 12/22/21 0045  vancomycin (VANCOCIN) IVPB 1000 mg/200 mL premix       ? 12/22/21 0033 12/22/21 0143  ? 12/22/21 0045  ceFEPIme (MAXIPIME) 2 g in sodium chloride 0.9 % 100 mL IVPB       ?  12/22/21 0033 12/22/21 0106  ? ?  ?  ? ? ?Devices ?  ? ?LINES / TUBES:  ? ? ? ? ?Continuous Infusions: ? sodium chloride 250 mL (12/26/21 0938)  ? ? ? ?Objective: ?Vitals:  ? 01/01/22 0645 01/01/22 0650 03/21/2

## 2022-01-01 NOTE — Progress Notes (Signed)
OT Cancellation Note ? ?Patient Details ?Name: Layana Konkel ?MRN: [id number] ?DOB: [birth year] ? ? ?Cancelled Treatment:    Reason Eval/Treat Not Completed: Medical issues which prohibited therapy ?Nursing asking to hold off at this time with low body temp. OT to continue to follow and check back as schedule will allow.  ?Adel Neyer OTR/L, MS ?Acute Rehabilitation Department ?Office# [phone] ?Pager# [phone] ? ? ?01/01/2022, 12:50 PM ?

## 2022-01-01 NOTE — Progress Notes (Signed)
?  ?  Durable Medical Equipment  ?(From admission, onward)  ?  ? ? ?  ? ?  Start     Ordered  ? 01/01/22 1523  For home use only DME Walker wide  Once       ?Question:  Patient needs a walker to treat with the following condition  Answer:  Physical deconditioning  ? 01/01/22 1525  ? 01/01/22 1522  For home use only DME Bedside commode  Once       ?Question:  Patient needs a bedside commode to treat with the following condition  Answer:  Physical deconditioning  ? 01/01/22 1525  ? 01/01/22 1521  For home use only DME Hospital bed  Once       ?Question Answer Comment  ?Length of Need Lifetime   ?The above medical condition requires: Patient requires the ability to reposition frequently   ?Head must be elevated greater than: 30 degrees   ?Bed type Semi-electric   ?Trapeze Bar Yes   ?Support Surface: Gel Overlay   ?  ? 01/01/22 1525  ? ?  ?  ? ?  ?  ?

## 2022-01-02 DIAGNOSIS — Z515 Encounter for palliative care: Secondary | ICD-10-CM | POA: Diagnosis not present

## 2022-01-02 DIAGNOSIS — I5042 Chronic combined systolic (congestive) and diastolic (congestive) heart failure: Secondary | ICD-10-CM | POA: Diagnosis not present

## 2022-01-02 DIAGNOSIS — R6520 Severe sepsis without septic shock: Secondary | ICD-10-CM | POA: Diagnosis not present

## 2022-01-02 DIAGNOSIS — Z7189 Other specified counseling: Secondary | ICD-10-CM | POA: Diagnosis not present

## 2022-01-02 DIAGNOSIS — A419 Sepsis, unspecified organism: Secondary | ICD-10-CM | POA: Diagnosis not present

## 2022-01-02 DIAGNOSIS — L8990 Pressure ulcer of unspecified site, unspecified stage: Secondary | ICD-10-CM | POA: Insufficient documentation

## 2022-01-02 LAB — C-REACTIVE PROTEIN: CRP: 10.9 mg/dL — ABNORMAL HIGH (ref <1.0)

## 2022-01-02 LAB — COMPREHENSIVE METABOLIC PANEL
ALT: 16 U/L (ref 0–44)
AST: 20 U/L (ref 15–41)
Albumin: 1.6 g/dL — ABNORMAL LOW (ref 3.5–5.0)
Alkaline Phosphatase: 101 U/L (ref 38–126)
Anion gap: 10 (ref 5–15)
BUN: 83 mg/dL — ABNORMAL HIGH (ref 8–23)
CO2: 15 mmol/L — ABNORMAL LOW (ref 22–32)
Calcium: 6.7 mg/dL — ABNORMAL LOW (ref 8.9–10.3)
Chloride: 116 mmol/L — ABNORMAL HIGH (ref 98–111)
Creatinine, Ser: 5.45 mg/dL — ABNORMAL HIGH (ref 0.44–1.00)
GFR, Estimated: 8 mL/min — ABNORMAL LOW (ref >60)
Glucose, Bld: 69 mg/dL — ABNORMAL LOW (ref 70–99)
Potassium: 4.1 mmol/L (ref 3.5–5.1)
Sodium: 141 mmol/L (ref 135–145)
Total Bilirubin: 0.3 mg/dL (ref 0.3–1.2)
Total Protein: 4.9 g/dL — ABNORMAL LOW (ref 6.5–8.1)

## 2022-01-02 LAB — CBC WITH DIFFERENTIAL/PLATELET
Abs Immature Granulocytes: 0.02 10*3/uL (ref 0.00–0.07)
Basophils Absolute: 0 10*3/uL (ref 0.0–0.1)
Basophils Relative: 0 %
Eosinophils Absolute: 0.1 10*3/uL (ref 0.0–0.5)
Eosinophils Relative: 1 %
HCT: 27.2 % — ABNORMAL LOW (ref 36.0–46.0)
Hemoglobin: 8.8 g/dL — ABNORMAL LOW (ref 12.0–15.0)
Immature Granulocytes: 0 %
Lymphocytes Relative: 17 %
Lymphs Abs: 1 10*3/uL (ref 0.7–4.0)
MCH: 28.3 pg (ref 26.0–34.0)
MCHC: 32.4 g/dL (ref 30.0–36.0)
MCV: 87.5 fL (ref 80.0–100.0)
Monocytes Absolute: 0.6 10*3/uL (ref 0.1–1.0)
Monocytes Relative: 10 %
Neutro Abs: 4.2 10*3/uL (ref 1.7–7.7)
Neutrophils Relative %: 72 %
Platelets: 188 10*3/uL (ref 150–400)
RBC: 3.11 MIL/uL — ABNORMAL LOW (ref 3.87–5.11)
RDW: 18.5 % — ABNORMAL HIGH (ref 11.5–15.5)
WBC: 5.9 10*3/uL (ref 4.0–10.5)
nRBC: 0 % (ref 0.0–0.2)

## 2022-01-02 LAB — D-DIMER, QUANTITATIVE: D-Dimer, Quant: 2.11 ug/mL-FEU — ABNORMAL HIGH (ref 0.00–0.50)

## 2022-01-02 LAB — FERRITIN: Ferritin: 575 ng/mL — ABNORMAL HIGH (ref 11–307)

## 2022-01-02 LAB — GLUCOSE, CAPILLARY: Glucose-Capillary: 114 mg/dL — ABNORMAL HIGH (ref 70–99)

## 2022-01-02 LAB — PHOSPHORUS: Phosphorus: 9.6 mg/dL — ABNORMAL HIGH (ref 2.5–4.6)

## 2022-01-02 LAB — MAGNESIUM: Magnesium: 3.1 mg/dL — ABNORMAL HIGH (ref 1.7–2.4)

## 2022-01-02 LAB — SEDIMENTATION RATE: Sed Rate: 101 mm/hr — ABNORMAL HIGH (ref 0–22)

## 2022-01-02 MED ORDER — MIDODRINE HCL 5 MG PO TABS: 5.0000 mg | ORAL_TABLET | Freq: Three times a day (TID) | ORAL | 0 refills | Status: DC

## 2022-01-02 MED ORDER — SODIUM BICARBONATE 650 MG PO TABS: 650.0000 mg | ORAL_TABLET | Freq: Three times a day (TID) | ORAL | Status: DC

## 2022-01-02 MED ORDER — FUROSEMIDE 40 MG PO TABS
ORAL_TABLET | ORAL | 3 refills | Status: DC
Start: 2022-01-02 — End: 2022-04-08

## 2022-01-02 MED ORDER — FOLIC ACID 1 MG PO TABS: 1.0000 mg | ORAL_TABLET | Freq: Every day | ORAL | 0 refills | Status: DC

## 2022-01-02 MED ORDER — SODIUM BICARBONATE 650 MG PO TABS
650.0000 mg | ORAL_TABLET | Freq: Two times a day (BID) | ORAL | Status: DC
  Administered 2022-01-02 – 2022-01-03 (×3): 650 mg via ORAL
  Filled 2022-01-02 (×3): qty 1

## 2022-01-02 NOTE — Discharge Summary (Addendum)
Physician Discharge Summary  Kara Mullins NFA:[id number] DOB: [birth year] DOA: 12/21/2021  PCP: Caesar Bookman, NP  Admit date: 12/21/2021 Discharge date: 01/02/2022  Admitted From: Home  Disposition:  Home with Home health.  Recommendations for Outpatient Follow-up:  Follow up with PCP in 1-2 weeks Please obtain BMP/CBC in one week Needs follow up with outpatient palliative care.   Home Health: Yes  Discharge Condition: Stable  CODE STATUS:Full code Diet recommendation: Heart Healthy   Brief/Interim Summary:  71 year old with combined systolic and diastolic heart failure, light chain disease with bone marrow biopsy shows slight increase of plasma cell 7% of plasma cell, hypertension, CKD stage V, discharged November 23, 2021 to nursing home.  Eventually from skilled nursing facility was discharged home with daughter. Patient relates that the day prior to admission she had a low blood pressure and variable heart rate, she was concerned she might be getting septic again, brought her into the emergency room and was found to be hypothermic, bradycardic, placed on Bair hugger, work-up revealed she was COVID-positive.  Chest x-ray showed bilateral infiltrates, she completed 2 days of IV Remdesivir, IV infusion heparin she was found to be in A-fib RVR.  1-Sepsis/COVID-pneumonia: POA Per report patient tested positive at the facility on 09/26/2022 Completed 2 days of IV remdesivir. Vitals Stable to discharge home.  2-Hypothermia: Resolved likely related to sepsis  3-Chronic combined systolic and diastolic heart failure/hypotension She Was a started on midodrine, continue at discharge. Laxis held due to worsening renal function hypotension. Resume with Coreg. Resume Lasix as needed for lower extremity edema  4-chronic A-fib: After lengthily discussion with pharmacy patient is a poor candidate for Lovenox and DOAC.  Follow-up with cardiology as an outpatient. Currently sinus  rhythm.  Prolonged QT: Avoid prolonged QT medication  CKD stage V: Not candidate for dialysis per nephrology prior admissions. Patient is very poor prognosis overall, she continued to want full code.  Palliative care was consulted, plan to discharge home with home health and palliative care follow-up as an outpatient. High risk for readmission. Resume oral bicarb tablets, for metabolic acidosis Develops urine retention 3/11, foley catheter placed. Discharge with foley, put patient follow up.   Abnormal thyroid function test,  sick euthyroid syndrome: Need TSH repeated in 4 weeks.  Anemia of Chronic renal disease, she received 2 unit packed red blood cells during this admission Thrombocytopenia: Improved Hypocalcemia, stable hypocalcemia corrected by albumin 8.5  Goals of care: Patient continued to once aggressive care and full code.  She will be discharged home with home health and palliative care follow-up.  She is a very poor prognosis.  Stable for discharge  Discharge Diagnoses:  Principal Problem:   Sepsis (HCC) Active Problems:   COVID-19 virus infection   Hypothermia   Atrial fibrillation (HCC)   CKD (chronic kidney disease) stage 5, GFR less than 15 ml/min (HCC)   Chronic combined systolic and diastolic congestive heart failure (HCC)   Anemia of chronic kidney failure, stage 5 (HCC)   Prolonged QT interval   Palliative care by specialist   Goals of care, counseling/discussion   General weakness   Pneumonia due to COVID-19 virus   Hypotension, unspecified   Pressure injury of skin    Discharge Instructions  Discharge Instructions     Diet - low sodium heart healthy   Complete by: As directed    Increase activity slowly   Complete by: As directed    No wound care   Complete by: As directed  Allergies as of 01/02/2022       Reactions   Chlorhexidine Other (See Comments)   Unknown reaction        Medication List     TAKE these medications     carvedilol 3.125 MG tablet Commonly known as: COREG Take 1 tablet (3.125 mg total) by mouth 2 (two) times daily with a meal.   folic acid 1 MG tablet Commonly known as: FOLVITE Take 1 tablet (1 mg total) by mouth daily. Start taking on: January 03, 2022   furosemide 40 MG tablet Commonly known as: LASIX Take daily as needed for edema. What changed: additional instructions   midodrine 5 MG tablet Commonly known as: PROAMATINE Take 1 tablet (5 mg total) by mouth 3 (three) times daily with meals.   multivitamin with minerals Tabs tablet Take 1 tablet by mouth daily. What changed: when to take this   sodium bicarbonate 650 MG tablet Take 1 tablet (650 mg total) by mouth 3 (three) times daily.   VITAMIN C PO Take 1 tablet by mouth daily after supper. Crush and mix with applesauce   ZINC PO Take 1 tablet by mouth daily after supper. Crush and mix with applesauce               Durable Medical Equipment  (From admission, onward)           Start     Ordered   01/01/22 1523  For home use only DME Walker wide  Once       Question:  Patient needs a walker to treat with the following condition  Answer:  Physical deconditioning   01/01/22 1525   01/01/22 1522  For home use only DME Bedside commode  Once       Question:  Patient needs a bedside commode to treat with the following condition  Answer:  Physical deconditioning   01/01/22 1525   01/01/22 1521  For home use only DME Hospital bed  Once       Question Answer Comment  Length of Need Lifetime   The above medical condition requires: Patient requires the ability to reposition frequently   Head must be elevated greater than: 30 degrees   Bed type Semi-electric   Trapeze Bar Yes   Support Surface: Gel Overlay      01/01/22 1525            Allergies  Allergen Reactions   Chlorhexidine Other (See Comments)    Unknown reaction    Consultations: Palliative Care   Procedures/Studies: CT Head Wo  Contrast  Result Date: 12/22/2021 CLINICAL DATA:  Hypothermia.  History of sepsis. EXAM: CT HEAD WITHOUT CONTRAST TECHNIQUE: Contiguous axial images were obtained from the base of the skull through the vertex without intravenous contrast. RADIATION DOSE REDUCTION: This exam was performed according to the departmental dose-optimization program which includes automated exposure control, adjustment of the mA and/or kV according to patient size and/or use of iterative reconstruction technique. COMPARISON:  CT 11/09/2021.  MRI 11/09/2021 FINDINGS: Brain: No evidence of acute infarction, hemorrhage, hydrocephalus, extra-axial collection or mass lesion/mass effect. Vascular: No hyperdense vessel or unexpected calcification. Skull: Normal. Negative for fracture or focal lesion. Sinuses/Orbits: Mucosal thickening in the paranasal sinuses. No acute air-fluid levels. Mastoid air cells are clear. Other: Congenital nonunion of the posterior arch of C1. IMPRESSION: No acute intracranial abnormalities.  No change since prior study. Electronically Signed   By: Burman Nieves M.D.   On: 12/22/2021 02:46  DG Chest Portable 1 View  Result Date: 12/22/2021 CLINICAL DATA:  Sepsis EXAM: PORTABLE CHEST 1 VIEW COMPARISON:  11/13/2021 FINDINGS: Cardiac enlargement. Mild perihilar edema. Probable small pleural effusions. Atelectasis in the left base behind the heart. No pneumothorax. Calcification of the aorta. Appearances are similar to previous study, allowing for differences in technique. IMPRESSION: Cardiac enlargement with suggestion of mild pulmonary edema and small pleural effusions. Electronically Signed   By: Burman Nieves M.D.   On: 12/22/2021 00:35     Subjective: Feeling well, denies dyspnea.   Discharge Exam: Vitals:   01/02/22 0513 01/02/22 1026  BP: 132/63 133/70  Pulse: 65 (!) 56  Resp: 20   Temp: 98.4 F (36.9 C) 97.8 F (36.6 C)  SpO2: 100% 100%     General: Pt is alert, awake, not in acute  distress Cardiovascular: RRR, S1/S2 +, no rubs, no gallops Respiratory: CTA bilaterally, no wheezing, no rhonchi Abdominal: Soft, NT, ND, bowel sounds + Extremities: no edema, no cyanosis    The results of significant diagnostics from this hospitalization (including imaging, microbiology, ancillary and laboratory) are listed below for reference.     Microbiology: No results found for this or any previous visit (from the past 240 hour(s)).   Labs: BNP (last 3 results) Recent Labs    11/06/21 1617 11/08/21 2357 12/19/21 1020  BNP 660* 588.4* 510.8*   Basic Metabolic Panel: Recent Labs  Lab 12/29/21 0347 12/30/21 0414 12/31/21 0422 12/31/21 0536 01/01/22 0351 01/02/22 0410  NA 140 142 140  --  143 141  K 3.9 4.3 4.1  --  4.1 4.1  CL 115* 118* 118*  --  119* 116*  CO2 15* 15* 15*  --  16* 15*  GLUCOSE 84 122* 116*  --  86 69*  BUN 85* 87* 86*  --  88* 83*  CREATININE 5.53* 5.59* 5.68*  --  5.63* 5.45*  CALCIUM 6.8* 6.8* 6.9*  --  6.9* 6.7*  MG 2.9* 2.9* 3.1*  --  2.9* 3.1*  PHOS 9.2* 8.9*  --  9.8* 10.0* 9.6*   Liver Function Tests: Recent Labs  Lab 12/29/21 0347 12/30/21 0414 12/31/21 0422 01/01/22 0351 01/02/22 0410  AST 22 22 34 28 20  ALT 12 13 19 19 16   ALKPHOS 94 94 94 99 101  BILITOT 0.4 0.2* 0.2* 0.3 0.3  PROT 4.5* 4.7* 5.2* 4.8* 4.9*  ALBUMIN <1.5* <1.5* 2.1* 1.7* 1.6*   No results for input(s): LIPASE, AMYLASE in the last 168 hours. No results for input(s): AMMONIA in the last 168 hours. CBC: Recent Labs  Lab 12/29/21 0347 12/30/21 0414 12/31/21 0422 12/31/21 2045 01/01/22 0351 01/02/22 0410  WBC 4.2 4.2 4.7  --  5.3 5.9  NEUTROABS 2.6 2.7 3.3  --  3.9 4.2  HGB 7.5* 7.3* 6.9* 9.3* 8.6* 8.8*  HCT 23.9* 23.8* 22.4* 29.3* 27.0* 27.2*  MCV 88.5 90.5 89.6  --  88.8 87.5  PLT 62* 85* 109*  --  139* 188   Cardiac Enzymes: No results for input(s): CKTOTAL, CKMB, CKMBINDEX, TROPONINI in the last 168 hours. BNP: Invalid input(s):  POCBNP CBG: No results for input(s): GLUCAP in the last 168 hours. D-Dimer Recent Labs    01/01/22 0351 01/02/22 0410  DDIMER 2.02* 2.11*   Hgb A1c No results for input(s): HGBA1C in the last 72 hours. Lipid Profile No results for input(s): CHOL, HDL, LDLCALC, TRIG, CHOLHDL, LDLDIRECT in the last 72 hours. Thyroid function studies No results for input(s): TSH, T4TOTAL,  T3FREE, THYROIDAB in the last 72 hours.  Invalid input(s): FREET3 Anemia work up Recent Labs    01/01/22 0351 01/02/22 0410  FERRITIN 584* 575*   Urinalysis    Component Value Date/Time   COLORURINE YELLOW 12/22/2021 0011   APPEARANCEUR HAZY (A) 12/22/2021 0011   LABSPEC 1.022 12/22/2021 0011   PHURINE 5.0 12/22/2021 0011   GLUCOSEU 150 (A) 12/22/2021 0011   HGBUR SMALL (A) 12/22/2021 0011   BILIRUBINUR NEGATIVE 12/22/2021 0011   KETONESUR NEGATIVE 12/22/2021 0011   PROTEINUR >=300 (A) 12/22/2021 0011   UROBILINOGEN 0.2 11/19/2019 2004   NITRITE NEGATIVE 12/22/2021 0011   LEUKOCYTESUR NEGATIVE 12/22/2021 0011   Sepsis Labs Invalid input(s): PROCALCITONIN,  WBC,  LACTICIDVEN Microbiology No results found for this or any previous visit (from the past 240 hour(s)).   Time coordinating discharge: 40 minutes  SIGNED:   Alba Cory, MD  [facility]

## 2022-01-02 NOTE — TOC Progression Note (Addendum)
Transition of Care (TOC) - Progression Note  ? ? ?Patient Details  ?Name: Kara Mullins ?MRN: [id number] ?Date of Birth: 1951/08/21 ? ?Transition of Care (TOC) CM/SW Contact  ?Dobbins, LCSW ?Phone Number: ?01/02/2022, 1:02 PM ? ?Clinical Narrative:    ? ?CSW contacted [employer] and is informed that hospital bed shows as delivered and that walker will be delivered as well. CSW contacted pt's daughter; she reports that she has not received the hospital bed. She stated that she didn't get a call from [employer] regarding drop off and has been at the hospital all morning. CSW called [employer] [facility] line; they stated they would follow up to clarify if bed was delivered or not.  ? ?1330: CSW called [facility] line. Spoke to [place] who stated that bed has not been delivered. She will contact CSW back regarding ETA of bed.  ? ?69: CSW spoke with pt daughter. She states she  never received call regarding the hospital bed so she had left the home briefly and during this time [employer] came and left a note saying they came but were unable to deliver bed since no one was there. Pt daughter states she has called [facility] and is waiting on call back.  ? ?1638: CSW called daughter; she has not heard back from [employer]. CSW contacted [facility] line and spoke with [place]. Jasmine explained that after initial missed delivery, pts hospital bed was put back in the queue for delivery tonight. CSW inquired about when the hospital bed would be delivered and [place] explained it depends on technicians route. Jasmine stated she would notify technician to call pts daughter 1 hour before they arrive with hospital bed.  ? ?Expected Discharge Plan:  (TBD) ?Barriers to Discharge: Continued Medical Work up ? ?Expected Discharge Plan and Services ?Expected Discharge Plan:  (TBD) ?  ?  ?  ?  ?Expected Discharge Date: 01/02/22               ?  ?  ?  ?  ?  ?  ?  ?  ?  ?  ? ? ?Social Determinants of Health (SDOH) Interventions ?  ? ?Readmission Risk Interventions ? ?   12/23/2021  ? 11:01 AM  ?Readmission Risk Prevention Plan  ?Transportation Screening Complete  ?PCP or Specialist Appt within 3-5 Days Complete  ?[place] or Home Care Consult Complete  ?Social Work Consult for [place] Planning/Counseling Complete  ?Palliative Care Screening Not Applicable  ?Medication Review Press photographer) Complete  ? ? ?

## 2022-01-02 NOTE — Progress Notes (Signed)
Physical Therapy Treatment ?Patient Details ?Name: Kara Mullins ?MRN: [id number] ?DOB: Oct 01, 1951 ?Today's Date: 01/02/2022 ? ? ?History of Present Illness 71 y.o. female with medical history significant of chronic combined systolic and diastolic heart failure, generalized anxiety disorder, hyperlipidemia, hypertension, stage IIIb CKD who was brought to the emergency department due to inability to swallow less than an hour before arrival. While in ED, pt had episode of syncope, found to be hypothermic and evidence suggesting UTI. Dx of sepsis. ? ?  ?PT Comments  ? ? General Comments: AxO x 3 very pleasant and motivated. Assisted OOB to amb in room went well.  General transfer comment: increased ability to self rise from bed using forward momentum/lean. General Gait Details: pt was able to amb in room 12 feet x 2 with walker.  No c/o dizziness.General bed mobility comments: increased time pt was able to transition to EOB and back to bed slightly increased assist.  Moving well.  Pt declines SNF.  Recently at [facility].  Pt getting a hospital bed. ?  ?Recommendations for follow up therapy are one component of a multi-disciplinary discharge planning process, led by the attending physician.  Recommendations may be updated based on patient status, additional functional criteria and insurance authorization. ? ?Follow Up Recommendations ? Home health PT (pt declines SNF and plans to D/C back home with daughter.) ?  ?  ?Assistance Recommended at Discharge Frequent or constant Supervision/Assistance  ?Patient can return home with the following A little help with walking and/or transfers;A little help with bathing/dressing/bathroom;Assistance with cooking/housework;Assist for transportation;Help with stairs or ramp for entrance ?  ?Equipment Recommendations ? None recommended by PT  ?  ?Recommendations for Other Services   ? ? ?  ?Precautions / Restrictions Precautions ?Precautions: Fall ?Precaution Comments: B LE chronic  edema ?Restrictions ?Weight Bearing Restrictions: No  ?  ? ?Mobility ? Bed Mobility ?Overal bed mobility: Needs Assistance ?Bed Mobility: Supine to Sit, Sit to Supine ?  ?  ?Supine to sit: HOB elevated, Min assist ?Sit to supine: Mod assist ?  ?General bed mobility comments: increased time pt was able to transition to EOB and back to bed slightly increased assist.  Moving well.  Pt getting a hospital bed. ?  ? ?Transfers ?Overall transfer level: Needs assistance ?Equipment used: Rolling walker (2 wheels) ?Transfers: Sit to/from Stand ?Sit to Stand: Min assist ?  ?  ?  ?  ?  ?General transfer comment: increased ability to self rise from bed using forward momentum/lean. ?  ? ?Ambulation/Gait ?Ambulation/Gait assistance: Supervision, Min guard ?Gait Distance (Feet): 24 Feet ?Assistive device: Rolling walker (2 wheels) ?Gait Pattern/deviations: Step-to pattern, Decreased step length - right, Decreased step length - left, Shuffle, Trunk flexed, Wide base of support ?Gait velocity: decreased ?  ?  ?General Gait Details: pt was able to amb in room 12 feet x 2 with walker.  No c/o dizziness. ? ? ?Stairs ?  ?  ?  ?  ?  ? ? ?Wheelchair Mobility ?  ? ?Modified Rankin (Stroke Patients Only) ?  ? ? ?  ?Balance   ?  ?  ?  ?  ?  ?  ?  ?  ?  ?  ?  ?  ?  ?  ?  ?  ?  ?  ?  ? ?  ?Cognition Arousal/Alertness: Awake/alert ?Behavior During Therapy: [facility] for tasks assessed/performed ?Overall Cognitive Status: Within Functional Limits for tasks assessed ?  ?  ?  ?  ?  ?  ?  ?  ?  ?  ?  ?  ?  ?  ?  ?  ?  General Comments: AxO x 3 very pleasant and motivated. ?  ?  ? ?  ?Exercises   ? ?  ?General Comments   ?  ?  ? ?Pertinent Vitals/Pain Pain Assessment ?Pain Assessment: Faces ?Pain Location: general B knees "bad" ?Pain Descriptors / Indicators: Heaviness ?Pain Intervention(s): Monitored during session  ? ? ?Home Living   ?  ?  ?  ?  ?  ?  ?  ?  ?  ?   ?  ?Prior Function    ?  ?  ?   ? ?PT Goals (current goals can now be found in the care plan  section) Progress towards PT goals: Progressing toward goals ? ?  ?Frequency ? ? ? Min 3X/week ? ? ? ?  ?PT Plan Discharge plan needs to be updated  ? ? ?Co-evaluation   ?  ?  ?  ?  ? ?  ?AM-PAC PT "6 Clicks" Mobility   ?Outcome Measure ? Help needed turning from your back to your side while in a flat bed without using bedrails?: A Little ?Help needed moving from lying on your back to sitting on the side of a flat bed without using bedrails?: A Little ?Help needed moving to and from a bed to a chair (including a wheelchair)?: A Little ?Help needed standing up from a chair using your arms (e.g., wheelchair or bedside chair)?: A Little ?Help needed to walk in hospital room?: A Little ?Help needed climbing 3-5 steps with a railing? : A Lot ?6 Click Score: 17 ? ?  ?End of Session Equipment Utilized During Treatment: Gait belt ?Activity Tolerance: Patient tolerated treatment well ?Patient left: in bed;with call bell/phone within reach;with bed alarm set ?Nurse Communication: Mobility status ?PT Visit Diagnosis: Difficulty in walking, not elsewhere classified (R26.2);Muscle weakness (generalized) (M62.81) ?  ? ? ?Time: 1440-1506 ?PT Time Calculation (min) (ACUTE ONLY): 26 min ? ?Charges:  $Gait Training: 8-22 mins ?$Therapeutic Activity: 8-22 mins          ?          ? ?{Shawniece Oyola  PTA ?Acute  Rehabilitation Services ?Pager      [phone] ?Office      [phone] ? ? ? ?

## 2022-01-02 NOTE — Progress Notes (Signed)
? ?                                                                                                                                                     ?                                                   ?Daily Progress Note  ? ?Patient Name: Kara Mullins       Date: 01/02/2022 ?DOB: [birth year]  Age: 71 y.o. MRN#: [id number] ?Attending Physician: Elmarie Shiley, MD ?Primary Care Physician: [name], NP ?Admit Date: 12/21/2021 ? ?Reason for Consultation/Follow-up: Establishing goals of care ? ?Subjective: ?I saw and examined Kara Mullins and met with her and her daughter, [name], at the bedside. ? ?Kara Mullins reports she is feeling better today and denies any complaints. ? ?We reviewed again patient desire to transition home with home health services.  I recommended palliative care to follow as an outpatient as we discussed yesterday about possible transition to hospice if care needs were not able to be met with home health if she continues to decline. ? ?Discussed paperwork for Kara Mullins's employment and I reviewed with her notes from outpatient office (Kara Mullins) written yesterday that note plan to complete paperwork that was faxed to their office from [facility] for recertification. ?  ?Length of Stay: 11 ? ?Current Medications: ?Scheduled Meds:  ? sodium chloride   Intravenous Once  ? sodium chloride   Intravenous Once  ? sodium chloride   Intravenous Once  ? carvedilol  3.125 mg Oral BID WC  ? folic acid  1 mg Oral Daily  ? mouth rinse  15 mL Mouth Rinse BID  ? midodrine  5 mg Oral TID WC  ? multivitamin with minerals  1 tablet Oral Daily  ? sodium bicarbonate  650 mg Oral BID  ? sodium chloride flush  3 mL Intravenous Q12H  ? ? ?Continuous Infusions: ? sodium chloride 250 mL (12/26/21 0938)  ? ? ?PRN Meds: ?sodium chloride, acetaminophen, guaiFENesin-dextromethorphan, hydrALAZINE, senna-docusate, sodium chloride flush ? ?Physical Exam         ?General: Alert, awake, in no acute  distress.   ?HEENT: No bruits, no goiter, no JVD ?Heart: Regular rate and rhythm. No murmur appreciated. ?Lungs: Good air movement, clear ?Abdomen: Soft, nontender, nondistended, positive bowel sounds.   ?Ext: No significant edema ?Skin: Warm and dry ?Neuro: Grossly intact, nonfocal.  ? ?Vital Signs: BP 133/70 (BP Location: Right Arm)   Pulse (!) 56   Temp 97.8 ?F (36.6 ?C)   Resp 20   Ht _0  (1.651 m)   Wt 103.1 kg   SpO2 100%  BMI 37.82 kg/m?  ?SpO2: SpO2: 100 % ?O2 Device: O2 Device: Room Air ?O2 Flow Rate:   ? ?Intake/output summary:  ?Intake/Output Summary (Last 24 hours) at 01/02/2022 1159 ?Last data filed at 01/01/2022 2140 ?Gross per 24 hour  ?Intake 243 ml  ?Output 350 ml  ?Net -107 ml  ? ? ?LBM: Last BM Date : 12/31/21 ?Baseline Weight: Weight: 93.7 kg ?Most recent weight: Weight: 103.1 kg ? ?     ?Palliative Assessment/Data: ? ? ? ?Flowsheet Rows   ? ?Flowsheet Row Most Recent Value  ?Intake Tab   ?Referral Department Hospitalist  ?Unit at Time of Referral ICU  ?Palliative Care Primary Diagnosis Sepsis/Infectious Disease  ?Date Notified 12/22/21  ?Palliative Care Type Return patient Palliative Care  ?Reason for referral Clarify Goals of Care  ?Date of Admission 12/21/21  ?Date first seen by Palliative Care 12/22/21  ?# of days Palliative referral response time 0 Day(s)  ?# of days IP prior to Palliative referral 1  ?Clinical Assessment   ?Palliative Performance Scale Score 50%  ?Psychosocial & Spiritual Assessment   ?Palliative Care Outcomes   ?Patient/Family meeting held? Yes  ?Who was at the meeting? Patient, daughter  ?Palliative Care Outcomes Clarified goals of care  ? ?  ? ? ?Patient Active Problem List  ? Diagnosis Date Noted  ? Hypertensive urgency 03/05/2021  ? Acute CHF (congestive heart failure) ([place]) 03/05/2021  ? Elevated d-dimer 03/05/2021  ? Nausea and vomiting 03/05/2021  ? Elevated troponin level not due myocardial infarction 03/05/2021  ? Generalized anxiety disorder 03/05/2021  ?  Sepsis ([place]) 12/22/2021  ? COVID-19 virus infection 12/22/2021  ? Hypothermia 11/09/2021  ? Atrial fibrillation ([place]) 12/22/2021  ? CKD (chronic kidney disease) stage 5, GFR less than 15 ml/min (HCC) 12/22/2021  ? Chronic combined systolic and diastolic congestive heart failure ([place]) 11/09/2021  ? Anemia of chronic kidney failure, stage 5 ([place]) 12/22/2021  ? Pressure injury of skin 01/02/2022  ? Hypotension, unspecified 12/30/2021  ? Pneumonia due to COVID-19 virus 12/26/2021  ? Palliative care by specialist   ? Goals of care, counseling/discussion   ? General weakness   ? Prolonged QT interval 12/22/2021  ? Debility 11/20/2021  ? Metabolic acidosis 19/37/9024  ? Severe sepsis ([place]) 11/19/2021  ? Hyperlipidemia 11/09/2021  ? Stage 3b chronic kidney disease (CKD) ([place]) 11/09/2021  ? Pancytopenia ([place]) 11/09/2021  ? Severe protein-calorie malnutrition ([place]) 11/09/2021  ? Acute combined systolic and diastolic HF (heart failure) ([place])   ? AKI (acute kidney injury) ([place])   ? CHF exacerbation ([place]) 03/06/2021  ? Essential hypertension   ? Acute congestive heart failure ([place]) 03/05/2021  ? ? ?Palliative Care Assessment & Plan  ? ?Patient Profile: ?71 y.o. 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  She was brought to ED after her daughter worried she was getting septic again at home and was found to be COVID-positive with bilateral infiltrates on x-ray.  Started on remdesivir for COVID and heparin as she was also found to be in A-fib with RVR.  Palliative consulted for goals of care. ?  ? ?Recommendations/Plan: ?Full code/full scope.  Multiple discussions about irreversible nature of her illness have been had with both patient and her family.  They understand this fact, however, she continues to express desire for full code/full scope  treatment. ?Discussed discharge options including attempts at  rehab versus home hospice support.  Patient and her daughter continue to report desire to transition home with home health and outpatient palliative care following. ? ?Goals of Care and Additional Recommendations: ?Limitations on Scope of Treatment: Full Scope Treatment ? ?Code Status: ? ?  ?Code Status Orders  ?(From admission, onward)  ?  ? ? ?  ? ?  Start     Ordered  ? 12/22/21 0514  Full code  Continuous       ? 12/22/21 0513  ? ?  ?  ? ?  ? ?Code Status History   ? ? Date Active Date Inactive Code Status Order ID Comments User Context  ? 11/17/2021 1741 11/24/2021 0051 DNR [id number]  Barb Merino, MD Inpatient  ? 11/09/2021 0734 11/17/2021 1741 Full Code [id number]  Reubin Milan, MD Inpatient  ? 03/05/2021 0735 03/15/2021 1931 Full Code [id number]  Vernelle Emerald, MD ED  ? ?  ? ?Advance Directive Documentation   ? ?Flowsheet Row Most Recent Value  ?Type of Advance Directive Living will  ?Pre-existing out of facility DNR order (yellow form or pink MOST form) --  ?"MOST" Form in Place? --  ? ?  ? ? ?Prognosis: ? Unable to determine, but if her kidney function continues to worsen, prognosis will likely be weeks at best. Patient is aware of this possibility and knows that this is essentially an irreversible condition.  ? ?Discharge Planning: ?Home with home health with palliative.  ?Care plan was discussed with patient, daughter and [facility] MD.   ? ?Thank you for allowing the Palliative Medicine Team to assist in the care of this patient. ? ? ?Micheline Rough, MD ? ?Please contact Palliative Medicine Team phone at [phone] for questions and concerns.  ? ? ? ? ? ?

## 2022-01-02 NOTE — Progress Notes (Signed)
Occupational Therapy Treatment ?Patient Details ?Name: Kara Mullins ?MRN: [id number] ?DOB: 1950/11/20 ?Today's Date: 01/02/2022 ? ? ?History of present illness 71 y.o. female with medical history significant of chronic combined systolic and diastolic heart failure, generalized anxiety disorder, hyperlipidemia, hypertension, stage IIIb CKD who was brought to the emergency department due to inability to swallow less than an hour before arrival. While in ED, pt had episode of syncope, found to be hypothermic and evidence suggesting UTI. Dx of sepsis. ?  ?OT comments ? Patient was able to transfer from edge of bed to recliner on this date with daughter present. Patient and daughter reported plan to transition home with [facility]. Patient and daughter were educated on current assist levels for transfers, functional mobility and ADLs. Patient's daughter inquired about BUE exercises. Patient and daughter were educated on positioning of BUE to reduce edmea and AROM/AAROM to complete with BUE as tolerated. Patient and daughter were educated on ECT and importance of focusing on things that patient desires. Patient and daughter verbalized understanding. Patient would continue to benefit from skilled OT services at this time while admitted and after d/c to address noted deficits in order to improve overall safety and independence in ADLs.  ? ?Blood pressure: ?Supine in bed:141/65 (87) 63 BPM  ?Sitting EOB: 134/67 (87) 61 BPM  ? ?Recommendations for follow up therapy are one component of a multi-disciplinary discharge planning process, led by the attending physician.  Recommendations may be updated based on patient status, additional functional criteria and insurance authorization. ?   ?Follow Up Recommendations ? Skilled nursing-short term rehab (<3 hours/day)  ?  ?Assistance Recommended at Discharge Frequent or constant Supervision/Assistance  ?Patient can return home with the following ? A lot of help with  bathing/dressing/bathroom;A little help with walking and/or transfers;Assistance with cooking/housework;Help with stairs or ramp for entrance;Assist for transportation ?  ?Equipment Recommendations ? BSC/3in1;Wheelchair (measurements OT);Wheelchair cushion (measurements OT)  ?  ?Recommendations for Other Services   ? ?  ?Precautions / Restrictions Precautions ?Precautions: Fall ?Precaution Comments: monitor BP- orthostatic ?Restrictions ?Weight Bearing Restrictions: No  ? ? ?  ? ?Mobility Bed Mobility ?Overal bed mobility: Needs Assistance ?Bed Mobility: Supine to Sit ?  ?  ?Supine to sit: Mod assist, HOB elevated ?  ?  ?General bed mobility comments: physical assistance for BLE to EOB and to complete rotation to midline on edge of bed with use of bed pad. ?  ? ?Transfers ?  ?  ?  ?  ?  ?  ?  ?  ?  ?  ?  ?  ?Balance   ?  ?  ?  ?  ?  ?  ?  ?  ?  ?  ?  ?  ?  ?  ?  ?  ?  ?  ?   ? ?ADL either performed or assessed with clinical judgement  ? ?ADL Overall ADL's : Needs assistance/impaired ?  ?  ?  ?  ?  ?  ?  ?  ?  ?  ?  ?  ?  ?Toilet Transfer Details (indicate cue type and reason): patient was mod A of 1 and additional person for safety with transfers with 3x attempts to transfer. patient was noted to have extremely wide base of support first time standing with education on shoulder width apart being ideal. patient verbalized and demonstrated understanding. patient ws able to transfer to recliner with RW with increased time and audible crepitus. ?  ?  ?  ?  ?  ?  ?  ? ?  Extremity/Trunk Assessment   ?  ?  ?  ?  ?  ? ?Vision   ?  ?  ?Perception   ?  ?Praxis   ?  ? ?Cognition Arousal/Alertness: Awake/alert ?Behavior During Therapy: [facility] for tasks assessed/performed ?Overall Cognitive Status: Within Functional Limits for tasks assessed ?  ?  ?  ?  ?  ?  ?  ?  ?  ?  ?  ?  ?  ?  ?  ?  ?General Comments: noted to be less conversive with therapist today. daughter in room during session ?  ?  ?   ?Exercises Other Exercises ?Other  Exercises: daughter inquired about BUE exercises to complete at home. patient and daughter were eduacted on using BUE for functional tasks like ADLs. patient and daughter educated on BUE ROM to maintain ROM. patient and daughter verbalized understanding. patient and daughter were educated on positioning of BUE to reduce edema. patient and daughter verbalized understanding. ? ?  ?Shoulder Instructions   ? ? ?  ?General Comments    ? ? ?Pertinent Vitals/ Pain       Pain Assessment ?Pain Assessment: No/denies pain ? ?Home Living   ?  ?  ?  ?  ?  ?  ?  ?  ?  ?  ?  ?  ?  ?  ?  ?  ?  ?  ? ?  ?Prior Functioning/Environment    ?  ?  ?  ?   ? ?Frequency ? Min 2X/week  ? ? ? ? ?  ?Progress Toward Goals ? ?OT Goals(current goals can now be found in the care plan section) ? Progress towards OT goals: Progressing toward goals ? ?   ?Plan Discharge plan remains appropriate   ? ?Co-evaluation ? ? ?   ?  ?  ?  ?  ? ?  ?AM-PAC OT "6 Clicks" Daily Activity     ?Outcome Measure ? ? Help from another person eating meals?: A Little ?Help from another person taking care of personal grooming?: A Little ?Help from another person toileting, which includes using toliet, bedpan, or urinal?: A Lot ?Help from another person bathing (including washing, rinsing, drying)?: A Lot ?Help from another person to put on and taking off regular upper body clothing?: A Little ?Help from another person to put on and taking off regular lower body clothing?: A Lot ?6 Click Score: 15 ? ?  ?End of Session Equipment Utilized During Treatment: Rolling walker (2 wheels) ? ?OT Visit Diagnosis: Muscle weakness (generalized) (M62.81) ?  ?Activity Tolerance Patient tolerated treatment well ?  ?Patient Left in chair;with call bell/phone within reach;with chair alarm set;with nursing/sitter in room;with family/visitor present ?  ?Nurse Communication Other (comment) (nurse present for transfers) ?  ? ?   ? ?Time: 8588-5027 ?OT Time Calculation (min): 28 min ? ?Charges:  OT General Charges ?$OT Visit: 1 Visit ?OT Treatments ?$Therapeutic Activity: 23-37 mins ? ?Banks Chaikin OTR/L, MS ?Acute Rehabilitation Department ?Office# [phone] ?Pager# [phone] ? ? ?Feliz Beam Keaton Stirewalt ?01/02/2022, 9:12 AM ?

## 2022-01-03 ENCOUNTER — Telehealth: Payer: Self-pay

## 2022-01-03 DIAGNOSIS — R6520 Severe sepsis without septic shock: Secondary | ICD-10-CM | POA: Diagnosis not present

## 2022-01-03 DIAGNOSIS — A419 Sepsis, unspecified organism: Secondary | ICD-10-CM | POA: Diagnosis not present

## 2022-01-03 MED ORDER — SODIUM BICARBONATE 650 MG PO TABS: 650.0000 mg | ORAL_TABLET | Freq: Three times a day (TID) | ORAL | 5 refills | Status: AC

## 2022-01-03 NOTE — Telephone Encounter (Signed)
Spoke with patient's daughter [name] and scheduled a Mychart Palliative Consult for 01/07/22 @ 1 PM. ? ?Consent obtained; updated Outlook/Netsmart/Team List and Epic.  ? ?

## 2022-01-03 NOTE — Telephone Encounter (Signed)
Kara Mullins please advise if ok to fill requested medication ? ?Side note: patient was in the ED yesterday and it appears the ER physician may have tired to refill however no print was selected  ?

## 2022-01-03 NOTE — Telephone Encounter (Signed)
FMLA Paperwork faxed to [employer]: [phone] ?

## 2022-01-03 NOTE — Progress Notes (Signed)
[employer]) Hospital Liaison Note ? ?This patient is currently enrolled in [facility] outpatient-based palliative care.  ? ?[facility] will continue to follow for discharge disposition.  ? ?Please call for any outpatient based palliative care related questions or concerns.  ? ?Thank you.  ? ?Roselee Nova, LCSW ?[facility] Liaison ?[phone] ?Roselee Nova,  ?

## 2022-01-03 NOTE — Telephone Encounter (Signed)
Noted.FMLA completed given to Rodena Piety May,CMA to fax as requested.  ?

## 2022-01-03 NOTE — Discharge Summary (Signed)
Physician Discharge Summary  ?Olayinka Gathers ZOX:[id number] DOB: 1951-01-20 DOA: 12/21/2021 ? ?PCP: [name], [name], NP ? ?Admit date: 12/21/2021 ?Discharge date: 01/03/2022 ? ?Admitted From: Home  ?Disposition:  Home with Home health. ? ?Recommendations for Outpatient Follow-up:  ?Follow up with PCP in 1-2 weeks ?Please obtain BMP/CBC in one week ?Needs follow up with outpatient palliative care for continuation of goals of care conversation. Patient with poor prognosis.  ? ?Home Health: Yes ? ?Discharge Condition: Stable  ?CODE STATUS:Full code ?Diet recommendation: Heart Healthy  ? ?Brief/Interim Summary:  ?71 year old with combined systolic and diastolic heart failure, light chain disease with bone marrow biopsy shows slight increase of plasma cell 7% of plasma cell, hypertension, CKD stage V, discharged November 23, 2021 to nursing home.  Eventually from skilled nursing facility was discharged home with daughter. Patient relates that the day prior to admission she had a low blood pressure and variable heart rate, she was concerned she might be getting septic again, brought her into the emergency room and was found to be hypothermic, bradycardic, placed on Bair hugger, work-up revealed she was COVID-positive.  Chest x-ray showed bilateral infiltrates, she completed 2 days of IV Remdesivir, IV infusion heparin she was found to be in A-fib RVR. ? ?1-Sepsis/COVID-pneumonia: POA ?Per report patient tested positive at the facility on 09/26/2022 ?Completed 2 days of IV remdesivir. ?Vitals Stable to discharge home. ? ?2-Hypothermia: Resolved likely related to sepsis ? ?3-Chronic combined systolic and diastolic heart failure/hypotension ?She Was a started on midodrine, continue at discharge. ?Laxis held due to worsening renal function hypotension. ?Resume with Coreg. ?Resume Lasix as needed for lower extremity edema ? ?4-chronic A-fib: ?After lengthily discussion with pharmacy patient is a poor candidate for Lovenox and  DOAC.  ?Follow-up with cardiology as an outpatient. ?Currently sinus rhythm. ? ?Prolonged QT: Avoid prolonged QT medication ? ?CKD stage V: Not candidate for dialysis per nephrology prior admissions. ?Patient is very poor prognosis overall, she continued to want full code.  Palliative care was consulted, plan to discharge home with home health and palliative care follow-up as an outpatient. ?High risk for readmission. ?Resume oral bicarb tablets, for metabolic acidosis ?Develops urine retention 3/11, foley catheter placed. Discharge with foley, Needs to follow up outpatient with urology.  ? ?Abnormal thyroid function test,  sick euthyroid syndrome: Need TSH repeated in 4 weeks. ? ?Anemia of Chronic renal disease, she received 2 unit packed red blood cells during this admission ?Thrombocytopenia: Improved ?Hypocalcemia, stable hypocalcemia corrected by albumin 8.5 ? ?Goals of care: Patient continued to want  aggressive care and full code.  She will be discharged home with home health and palliative care follow-up.  She is a very poor prognosis, per nephrology she was not good candidate for HD.  Stable for discharge.  ? ? ?Stable for discharge, high risk for readmission.  ? ?Discharge Diagnoses:  ?Principal Problem: ?  Sepsis ([place]) ?Active Problems: ?  COVID-19 virus infection ?  Hypothermia ?  Atrial fibrillation ([place]) ?  CKD (chronic kidney disease) stage 5, GFR less than 15 ml/min (HCC) ?  Chronic combined systolic and diastolic congestive heart failure ([place]) ?  Anemia of chronic kidney failure, stage 5 (HCC) ?  Prolonged QT interval ?  Palliative care by specialist ?  Goals of care, counseling/discussion ?  General weakness ?  Pneumonia due to COVID-19 virus ?  Hypotension, unspecified ?  Pressure injury of skin ? ? ? ?Discharge Instructions ? ?Discharge Instructions   ? ? Diet - low  sodium heart healthy   Complete by: As directed ?  ? Increase activity slowly   Complete by: As directed ?  ? No wound care    Complete by: As directed ?  ? ?  ? ?Allergies as of 01/03/2022   ? ?   Reactions  ? Chlorhexidine Other (See Comments)  ? Unknown reaction  ? ?  ? ?  ?Medication List  ?  ? ?TAKE these medications   ? ?carvedilol 3.125 MG tablet ?Commonly known as: COREG ?Take 1 tablet (3.125 mg total) by mouth 2 (two) times daily with a meal. ?  ?folic acid 1 MG tablet ?Commonly known as: FOLVITE ?Take 1 tablet (1 mg total) by mouth daily. ?  ?furosemide 40 MG tablet ?Commonly known as: LASIX ?Take daily as needed for edema. ?What changed: additional instructions ?  ?midodrine 5 MG tablet ?Commonly known as: PROAMATINE ?Take 1 tablet (5 mg total) by mouth 3 (three) times daily with meals. ?  ?multivitamin with minerals Tabs tablet ?Take 1 tablet by mouth daily. ?What changed: when to take this ?  ?sodium bicarbonate 650 MG tablet ?Take 1 tablet (650 mg total) by mouth 3 (three) times daily. ?  ?VITAMIN C PO ?Take 1 tablet by mouth daily after supper. Crush and mix with applesauce ?  ?ZINC PO ?Take 1 tablet by mouth daily after supper. Crush and mix with applesauce ?  ? ?  ? ?  ?  ? ? ?  ?Durable Medical Equipment  ?(From admission, onward)  ?  ? ? ?  ? ?  Start     Ordered  ? 01/01/22 1523  For home use only DME Walker wide  Once       ?Question:  Patient needs a walker to treat with the following condition  Answer:  Physical deconditioning  ? 01/01/22 1525  ? 01/01/22 1522  For home use only DME Bedside commode  Once       ?Question:  Patient needs a bedside commode to treat with the following condition  Answer:  Physical deconditioning  ? 01/01/22 1525  ? 01/01/22 1521  For home use only DME Hospital bed  Once       ?Question Answer Comment  ?Length of Need Lifetime   ?The above medical condition requires: Patient requires the ability to reposition frequently   ?Head must be elevated greater than: 30 degrees   ?Bed type Semi-electric   ?Trapeze Bar Yes   ?Support Surface: Gel Overlay   ?  ? 01/01/22 1525  ? ?  ?  ? ?   ? ? ?Allergies  ?Allergen Reactions  ? Chlorhexidine Other (See Comments)  ?  Unknown reaction  ? ? ?Consultations: ?Palliative Care ? ? ?Procedures/Studies: ?CT Head Wo Contrast ? ?Result Date: 12/22/2021 ?CLINICAL DATA:  Hypothermia.  History of sepsis. EXAM: CT HEAD WITHOUT CONTRAST TECHNIQUE: Contiguous axial images were obtained from the base of the skull through the vertex without intravenous contrast. RADIATION DOSE REDUCTION: This exam was performed according to the departmental dose-optimization program which includes automated exposure control, adjustment of the mA and/or kV according to patient size and/or use of iterative reconstruction technique. COMPARISON:  CT 11/09/2021.  MRI 11/09/2021 FINDINGS: Brain: No evidence of acute infarction, hemorrhage, hydrocephalus, extra-axial collection or mass lesion/mass effect. Vascular: No hyperdense vessel or unexpected calcification. Skull: Normal. Negative for fracture or focal lesion. Sinuses/Orbits: Mucosal thickening in the paranasal sinuses. No acute air-fluid levels. Mastoid air cells are clear. Other: Congenital nonunion  of the posterior arch of C1. IMPRESSION: No acute intracranial abnormalities.  No change since prior study. Electronically Signed   By: Lucienne Capers M.D.   On: 12/22/2021 02:46  ? ?DG Chest Portable 1 View ? ?Result Date: 12/22/2021 ?CLINICAL DATA:  Sepsis EXAM: PORTABLE CHEST 1 VIEW COMPARISON:  11/13/2021 FINDINGS: Cardiac enlargement. Mild perihilar edema. Probable small pleural effusions. Atelectasis in the left base behind the heart. No pneumothorax. Calcification of the aorta. Appearances are similar to previous study, allowing for differences in technique. IMPRESSION: Cardiac enlargement with suggestion of mild pulmonary edema and small pleural effusions. Electronically Signed   By: Lucienne Capers M.D.   On: 12/22/2021 00:35   ? ? ?Subjective: ?Feeling well, denies dyspnea.  ? ?Discharge Exam: ?Vitals:  ? 01/03/22 0529 01/03/22  0803  ?BP: (!) 150/66 (!) 149/64  ?Pulse: 61 61  ?Resp: 18 14  ?Temp: 97.8 ?F (36.6 ?C) 97.7 ?F (36.5 ?C)  ?SpO2: 100% 100%  ? ? ? ?General: Pt is alert, awake, not in acute distress ?Cardiovascular: RRR, S1/S2 +, no rubs

## 2022-01-03 NOTE — TOC Transition Note (Signed)
Transition of Care (TOC) - CM/SW Discharge Note ? ? ?Patient Details  ?Name: Kara Mullins ?MRN: [id number] ?Date of Birth: 02-20-1951 ? ?Transition of Care (TOC) CM/SW Contact:  ?[place], LCSW ?Phone Number: ?01/03/2022, 10:00 AM ? ? ?Clinical Narrative:    ? ?Confirmed with [facility] and pt daughter that hospital bed was delivered last night. Pt can DC home. HH through [facility] Pall[facility] with [facility]. [facility] called and RN notified.  ? ?Final next level of care: Other (comment) (TBD) ?Barriers to Discharge: Continued Medical Work up ? ? ?Patient Goals and CMS Choice ?  ?  ?  ? ?Discharge Placement ?  ?           ?  ?  ?  ?  ? ?Discharge Plan and Services ?  ?  ?           ?  ?  ?  ?  ?  ?  ?  ?  ?  ?  ? ?Social Determinants of Health (SDOH) Interventions ?  ? ? ?Readmission Risk Interventions ? ?  12/23/2021  ? 11:01 AM  ?Readmission Risk Prevention Plan  ?Transportation Screening Complete  ?PCP or Specialist Appt within 3-5 Days Complete  ?[place] or Home Care Consult Complete  ?Social Work Consult for [place] Planning/Counseling Complete  ?Palliative Care Screening Not Applicable  ?Medication Review Press photographer) Complete  ? ? ? ? ? ?

## 2022-01-03 NOTE — Telephone Encounter (Signed)
[name] wanted dates to be clarified with Patient's daughter.  ?Per daughter,  ?12/11/2021-01/26/2022 ? ?Forms placed in [name]'s folder for completion.  ?

## 2022-01-04 ENCOUNTER — Telehealth: Payer: Self-pay | Admitting: *Deleted

## 2022-01-04 DIAGNOSIS — R159 Full incontinence of feces: Secondary | ICD-10-CM | POA: Diagnosis not present

## 2022-01-04 DIAGNOSIS — G309 Alzheimer's disease, unspecified: Secondary | ICD-10-CM | POA: Diagnosis not present

## 2022-01-04 DIAGNOSIS — I130 Hypertensive heart and chronic kidney disease with heart failure and stage 1 through stage 4 chronic kidney disease, or unspecified chronic kidney disease: Secondary | ICD-10-CM | POA: Diagnosis not present

## 2022-01-04 DIAGNOSIS — I5042 Chronic combined systolic (congestive) and diastolic (congestive) heart failure: Secondary | ICD-10-CM | POA: Diagnosis not present

## 2022-01-04 DIAGNOSIS — R5381 Other malaise: Secondary | ICD-10-CM | POA: Diagnosis not present

## 2022-01-04 DIAGNOSIS — N1832 Chronic kidney disease, stage 3b: Secondary | ICD-10-CM | POA: Diagnosis not present

## 2022-01-04 DIAGNOSIS — E785 Hyperlipidemia, unspecified: Secondary | ICD-10-CM | POA: Diagnosis not present

## 2022-01-04 DIAGNOSIS — E43 Unspecified severe protein-calorie malnutrition: Secondary | ICD-10-CM | POA: Diagnosis not present

## 2022-01-04 DIAGNOSIS — Z9181 History of falling: Secondary | ICD-10-CM | POA: Diagnosis not present

## 2022-01-04 DIAGNOSIS — R32 Unspecified urinary incontinence: Secondary | ICD-10-CM | POA: Diagnosis not present

## 2022-01-04 NOTE — Telephone Encounter (Signed)
Donita with [facility] called requesting verbal orders for Nursing 1x4.  ?Verbal orders Given.  ? ?Patient has a TOC Scheduled.  ?

## 2022-01-04 NOTE — Telephone Encounter (Signed)
Transition Care Management Follow-up Telephone Call ?Date of discharge and from where: 01/03/2022 [place] ?How have you been since you were released from the hospital? better ?Any questions or concerns? No ? ?Items Reviewed: ?Did the pt receive and understand the discharge instructions provided? Yes  ?Medications obtained and verified? Yes  ?Other? No  ?Any new allergies since your discharge? No  ?Dietary orders reviewed? Yes ?Do you have support at home? Yes  ? ?Home Care and Equipment/Supplies: ?Were home health services ordered? yes ?If so, what is the name of the agency? [facility]  ?Has the agency set up a time to come to the patient's home? yes ?Were any new equipment or medical supplies ordered?  No ?What is the name of the medical supply agency? na ?Were you able to get the supplies/equipment? not applicable ?Do you have any questions related to the use of the equipment or supplies? No ? ?Functional Questionnaire: (I = Independent and D = Dependent) ?ADLs: D ? ?Bathing/Dressing- D ? ?Meal Prep- D ? ?Eating- I ? ?Maintaining continence- I ? ?Transferring/Ambulation- D can use walker alittle.  ? ?Managing Meds- D ? ?Follow up appointments reviewed: ? ?PCP Hospital f/u appt confirmed? Yes  Scheduled to see [name] on 01/08/2022 @ 11. Have to do a MyChart Visit due to unable to get patient up and transport.  ?[facility] f/u appt confirmed? No   ?Are transportation arrangements needed? No  ?If their condition worsens, is the pt aware to call PCP or go to the Emergency Dept.? Yes ?Was the patient provided with contact information for the PCP's office or ED? Yes ?Was to pt encouraged to call back with questions or concerns? Yes  ?

## 2022-01-06 ENCOUNTER — Telehealth: Payer: Self-pay | Admitting: Adult Health

## 2022-01-06 NOTE — Telephone Encounter (Signed)
Pt's daughter called to report BP taken at home was 175/99.  She has had carvedilol. She is no acute distress per her daughter and seems in her usual state of health. She does have prescription of lasix for edema. She is having some edema but no sob. Her daughter asked if it was ok to give the lasix. I agreed that it was ok. If she continues to have bp issue she can f/u with our office. She is going to see palliative care.  ?

## 2022-01-07 ENCOUNTER — Telehealth: Payer: Medicare (Managed Care) | Admitting: Internal Medicine

## 2022-01-07 ENCOUNTER — Other Ambulatory Visit: Payer: Self-pay

## 2022-01-07 VITALS — BP 122/78 | HR 54

## 2022-01-07 DIAGNOSIS — Z515 Encounter for palliative care: Secondary | ICD-10-CM

## 2022-01-07 DIAGNOSIS — R001 Bradycardia, unspecified: Secondary | ICD-10-CM

## 2022-01-07 DIAGNOSIS — N185 Chronic kidney disease, stage 5: Secondary | ICD-10-CM

## 2022-01-07 DIAGNOSIS — I9589 Other hypotension: Secondary | ICD-10-CM

## 2022-01-07 DIAGNOSIS — R5381 Other malaise: Secondary | ICD-10-CM

## 2022-01-07 DIAGNOSIS — Z8616 Personal history of COVID-19: Secondary | ICD-10-CM

## 2022-01-07 DIAGNOSIS — R630 Anorexia: Secondary | ICD-10-CM

## 2022-01-07 NOTE — Progress Notes (Signed)
[facility] Consult Note Telephone: [phone]  Fax: [phone]   Date of encounter: 01/07/22 1:32 PM PATIENT NAME: Kara Mullins [address] [place] [address]   [phone] (home)  DOB: Nov 29, 1950 MRN: [id number] PRIMARY CARE PROVIDER:    [name], NP,  Kara Mullins [address] [phone]  REFERRING PROVIDER:   [name], NP [address] [place],  [place] [address] [phone]  RESPONSIBLE PARTY:    Contact Information     Name Relation Home Work [place] Daughter   [phone]      Due to the COVID-19 crisis, this visit was done via telemedicine from my office and it was initiated and consent by this patient and or family.  I connected with  Kara Mullins OR PROXY on 01/08/22 by a video enabled telemedicine application and verified that I am speaking with the correct person using two identifiers.   I discussed the limitations of evaluation and management by telemedicine. The patient expressed understanding and agreed to proceed.   Palliative Care was asked to follow this patient by consultation request of  [name], [name], NP to address advance care planning and complex medical decision making. This is the initial visit.                                     ASSESSMENT AND PLAN / RECOMMENDATIONS:   Advance Care Planning/Goals of Care: Goals include to maximize quality of life and symptom management. Patient/health care surrogate gave his/her permission to discuss.Our advance care planning conversation included a discussion about:    The value and importance of advance care planning  Experiences with loved ones who have been seriously ill or have died  Exploration of personal, cultural or spiritual beliefs that might influence medical decisions  Exploration of goals of care in the event of a sudden injury or illness  Identification  of a healthcare agent--pt does  have living will and HCPOA designating daughter, Kara Mullins.   Review and updating or creation of an  advance directive document . Decision not to resuscitate or to de-escalate disease focused treatments due to poor prognosis.--was discussed and education about process and prognosis, outcomes, risks CODE STATUS:  Pt elects FULL code including being placed on a ventilator if needed; however, she says that if she is not improving in 24 hrs, she would like life support discontinued.   We agreed to meet in person to completed the MOST form accordingly 02/05/2022 Pt's current goal is to walk again and she does report she is able to help more with her dressing of her upper body and feeding herself (apparently even needed help with that initially)--has [facility] RN coming with PT, OT apparently to come out later this week.  She has a foley in place.  Symptom Management/Plan: CKD V--pt not HD candidate with current level of functional debility/PPS 40%  -continue to monitor volume status as best we can in view of inability to weigh at present, but her daughter has not detected more edema -may need to add antiemetic for nausea  Hypotension--continue midodrine and hold parameters for coreg (in view of afib with rvr); has not gotten prn lasix due to no change in edema  Bradycardia-continue coreg hold parameters  Anorexia--due to mix of CKD5, CHF and lack of activity, end stage illness; unfortunately not tolerating ensure--suggested alternative shakes to  try and sipping over the day  Functional decline--currently bedbound, awaits PT, OT though intake small and hypotension, bradycardia are going to really limit this--pt's goal is to walk again and wants to do therapy which is current plan at home; we will continue to follow and see how she does  H/o covid pneumonia--has some residual dyspnea and considerable overall decline due to this, will continue to monitor and provide education to pt and her daughter   Follow up  Palliative Care Visit: Palliative care will continue to follow for complex medical decision making, advance care planning, and clarification of goals. Return 4 weeks or prn.  03/01/2022  This visit was coded based on medical decision making (MDM).  17 minutes spent on ACP.  PPS: 40%  HOSPICE ELIGIBILITY/DIAGNOSIS: Likely is for CKDV/chf but not ready   Chief Complaint: initial palliative care consult  HISTORY OF PRESENT ILLNESS:  Kara Mullins is a 71 y.o. year old female  with chronic combined chf, light chain disease with slight increase in plasma cells (7%) on bone marrow, htn, CKDV, anemia, euthyroid sick syndrome, thrombocytopenia, and hypocalcemia with recent hospitalization with covid pneumonia and acute on chronic renal failure.  She was noted not to be a good candidate for hemodialysis by nephrology due to her poor overall prognosis.    Since returning home, her daughter has called the [facility] on call due to pt's bp running high and her edema; however, since then she's been having challenges with hypotension and bradycardia and persistent edema of her lower extremities.  Her coreg has been held for SBP <150 or HR <50.    Pt is currently requiring help with her bathing, dressing, and is spending all of her time in the bed.  She will sit up in bed for meals and can feed herself.  She is very weak.  She is short of breath "now and then" and has "floaters" in her vision Kara Mullins relates that this seems to be related to her low bp), she also runs warm lately with temps staying over 98 when she classically has a temp 96-97 range.  She's had difficulty with nausea, but has not had confusion since returning home (like she had at hospital admission).    She is getting [facility] RN services, and eventually PT, OT.  She's wearing compression socks for her edema.  She has a foley in place.  Kara Mullins cannot get her up on her own safely at this point (someone else coming tomorrow so they may try to get her to stand and  pivot to chair).    She is eating small snacks 3-4 times per day and can only get 1/2 an ensure (4oz) in b /Mullins she gets diarrhea from it and any other beverages besides water and ginger ale which seem to "stay in" ok.  Discussed switching types but she is apparently already on a lactose-free version.  History obtained from review of EMR, discussion with primary team, and interview with family, facility staff/caregiver and/or Ms. Orourke.  I reviewed available labs, medications, imaging, studies and related documents from the EMR.  Records reviewed and summarized above.   ROS   General: NAD EYES: denies vision changes ENMT: denies dysphagia Cardiovascular: denies chest pain, has some DOE Pulmonary: denies cough, some increased SOB but not worse than hospital Abdomen: endorses poor appetite, denies constipation, endorses incontinence of bowel and frequent loose stool GU: foley in place (reviewed what to look for if it is dislodged) MSK:  has increased weakness,  no falls reported Skin: denies rashes or wounds Neurological: denies pain, denies insomnia Psych: Endorses positive mood Heme/lymph/immuno: denies bruises, abnormal bleeding  Physical Exam:   Today's Vitals   01/07/22 1239  BP: 122/78  Pulse: (!) 54   Current and past weights:  last weight was 224.87 lbs on 01/03/22 with BMI 37.42 Constitutional: NAD, resting in bed, slight dyspnea with prolonged talking General: obese EYES: anicteric sclera, lids intact, no discharge  ENMT: intact hearing, oral mucous membranes moist CV:  LE edema (which is reportedly unchanged from hospital discharge per her daughter) Pulmonary: slight increased work of breathing with prolonged speech, no cough, room air Abdomen: intake 25% at best, no ascites GU: deferred MSK: sarcopenia, moves all extremities, not currently ambulatory--has been bedbound since returning home from hospital Skin: warm and dry, no rashes or wounds on visible skin--none  reported either Neuro:  has generalized weakness,  no cognitive impairment Psych: non-anxious affect, A and O x 3 Hem/lymph/immuno: no widespread bruising  CURRENT PROBLEM LIST:  Patient Active Problem List   Diagnosis Date Noted   Pressure injury of skin 01/02/2022   Hypotension, unspecified 12/30/2021   Pneumonia due to COVID-19 virus 12/26/2021   Palliative care by specialist    Goals of care, counseling/discussion    General weakness    Sepsis ([place]) 12/22/2021   COVID-19 virus infection 12/22/2021   Atrial fibrillation ([place]) 12/22/2021   CKD (chronic kidney disease) stage 5, GFR less than 15 ml/min (HCC) 12/22/2021   Anemia of chronic kidney failure, stage 5 (HCC) 12/22/2021   Prolonged QT interval 12/22/2021   Debility 16/07/9603   Metabolic acidosis 54/06/8118   Severe sepsis ([place]) 11/19/2021   Hypothermia 11/09/2021   Hyperlipidemia 11/09/2021   Chronic combined systolic and diastolic congestive heart failure ([place]) 11/09/2021   Stage 3b chronic kidney disease (CKD) ([place]) 11/09/2021   Pancytopenia ([place]) 11/09/2021   Severe protein-calorie malnutrition ([place]) 11/09/2021   Acute combined systolic and diastolic HF (heart failure) ([place])    AKI (acute kidney injury) ([employer])    CHF exacerbation ([place]) 03/06/2021   Essential hypertension    Hypertensive urgency 03/05/2021   Acute CHF (congestive heart failure) ([place]) 03/05/2021   Nausea and vomiting 03/05/2021   Elevated troponin level not due myocardial infarction 03/05/2021   Elevated d-dimer 03/05/2021   Generalized anxiety disorder 03/05/2021   Acute congestive heart failure ([place]) 03/05/2021   PAST MEDICAL HISTORY:  Active Ambulatory Problems    Diagnosis Date Noted   Hypertensive urgency 03/05/2021   Acute CHF (congestive heart failure) ([place]) 03/05/2021   Nausea and vomiting 03/05/2021   Elevated troponin level not due myocardial infarction 03/05/2021   Elevated d-dimer 03/05/2021   Generalized anxiety disorder  03/05/2021   Acute congestive heart failure ([place]) 03/05/2021   CHF exacerbation ([place]) 03/06/2021   Essential hypertension    AKI (acute kidney injury) ([place])    Acute combined systolic and diastolic HF (heart failure) ([place])    Hypothermia 11/09/2021   Hyperlipidemia 11/09/2021   Chronic combined systolic and diastolic congestive heart failure ([place]) 11/09/2021   Stage 3b chronic kidney disease (CKD) ([place]) 11/09/2021   Pancytopenia ([place]) 11/09/2021   Severe protein-calorie malnutrition ([place]) 11/09/2021   Severe sepsis ([place]) 11/19/2021   Debility 14/78/2956   Metabolic acidosis 21/30/8657   Sepsis ([place]) 12/22/2021   COVID-19 virus infection 12/22/2021   Atrial fibrillation ([place]) 12/22/2021   CKD (chronic kidney disease) stage 5, GFR less than 15 ml/min (HCC) 12/22/2021   Anemia of chronic kidney  failure, stage 5 (HCC) 12/22/2021   Prolonged QT interval 12/22/2021   Palliative care by specialist    Goals of care, counseling/discussion    General weakness    Pneumonia due to COVID-19 virus 12/26/2021   Hypotension, unspecified 12/30/2021   Pressure injury of skin 01/02/2022   Resolved Ambulatory Problems    Diagnosis Date Noted   No Resolved Ambulatory Problems   Past Medical History:  Diagnosis Date   Hypertension    SOCIAL HX:  Social History   Tobacco Use   Smoking status: Never   Smokeless tobacco: Never  Substance Use Topics   Alcohol use: Not Currently   FAMILY HX:  Family History  Problem Relation Age of Onset   Throat cancer Father       ALLERGIES:  Allergies  Allergen Reactions   Chlorhexidine Other (See Comments)    Unknown reaction     PERTINENT MEDICATIONS:  Outpatient Encounter Medications as of 01/07/2022  Medication Sig   Ascorbic Acid (VITAMIN Mullins PO) Take 1 tablet by mouth daily after supper. Crush and mix with applesauce   carvedilol (COREG) 3.125 MG tablet Take 1 tablet (3.125 mg total) by mouth 2 (two) times daily with a meal.   folic acid  (FOLVITE) 1 MG tablet Take 1 tablet (1 mg total) by mouth daily.   furosemide (LASIX) 40 MG tablet Take daily as needed for edema.   midodrine (PROAMATINE) 5 MG tablet Take 1 tablet (5 mg total) by mouth 3 (three) times daily with meals.   Multiple Vitamin (MULTIVITAMIN WITH MINERALS) TABS tablet Take 1 tablet by mouth daily. (Patient taking differently: Take 1 tablet by mouth at bedtime.)   Multiple Vitamins-Minerals (ZINC PO) Take 1 tablet by mouth daily after supper. Crush and mix with applesauce   sodium bicarbonate 650 MG tablet Take 1 tablet (650 mg total) by mouth 3 (three) times daily.   No facility-administered encounter medications on file as of 01/07/2022.   Thank you for the opportunity to participate in the care of Ms. Hatton.  The palliative care team will continue to follow. Please call our office at [phone] if we can be of additional assistance.   Hollace Kinnier, DO   COVID-19 PATIENT SCREENING TOOL Asked and negative response unless otherwise noted:  Have you had symptoms of covid, tested positive or been in contact with someone with symptoms/positive test in the past 5-10 days?  no

## 2022-01-08 ENCOUNTER — Other Ambulatory Visit: Payer: Self-pay

## 2022-01-08 ENCOUNTER — Encounter: Payer: Self-pay | Admitting: Internal Medicine

## 2022-01-08 ENCOUNTER — Encounter: Payer: Self-pay | Admitting: Family

## 2022-01-08 ENCOUNTER — Telehealth (INDEPENDENT_AMBULATORY_CARE_PROVIDER_SITE_OTHER): Payer: Medicare (Managed Care) | Admitting: Family

## 2022-01-08 DIAGNOSIS — R2681 Unsteadiness on feet: Secondary | ICD-10-CM

## 2022-01-08 DIAGNOSIS — I10 Essential (primary) hypertension: Secondary | ICD-10-CM

## 2022-01-08 DIAGNOSIS — I5042 Chronic combined systolic (congestive) and diastolic (congestive) heart failure: Secondary | ICD-10-CM

## 2022-01-08 NOTE — Progress Notes (Signed)
?This service is provided via telemedicine ? ?No vital signs collected/recorded due to the encounter was a telemedicine visit.  ? ?Location of patient (ex: home, work):  Home. ? ?Patient consents to a telephone visit:  Yes ? ?Location of the provider (ex: office, home):  [employer].  ? ?Name of any referring provider:  Sebrena Engh, [name], NP  ? ?Names of all persons participating in the telemedicine service and their role in the encounter:  Patient, Daughter/Terra Wynetta Emery, Heriberto Antigua, [place], [place], [place], NP.   ? ?Time spent on call: 8 minutes spent on the phone with Medical Assistant.   ? ? ?Provider: Marlowe Sax FNP-C ? ?Fatim Vanderschaaf, [name], NP ? ?Patient Care Team: ?Raaga Maeder, [name], NP as PCP - General (Family Medicine) ?Donato Heinz, MD as PCP - Cardiology (Cardiology) ? ?Extended Emergency Contact Information ?Primary Emergency Contact: Solum,TERRA ?Mobile Phone: [phone] ?Relation: Daughter ? ?Code Status:  Full Code  ?Goals of care: Advanced Directive information ? ?  01/08/2022  ? 11:43 AM  ?Advanced Directives  ?Does Patient Have a Medical Advance Directive? Yes  ?Type of Advance Directive Living will  ?Does patient want to make changes to medical advance directive? No - Patient declined  ? ? ? ?Chief Complaint  ?Patient presents with  ? Acute Visit  ?  Patient daughter "Kara Mullins" states that she needs to speak to Marlowe Sax, NP about Blood pressure medication, Foley Catheter, and Wheelchair.   ? ? ?HPI:  ?Pt is a 71 y.o. female seen today for an acute visit for to discuss blood pressure medication and need for wheelchair.  HPI information is provided by patient and patient's daughter present during the video visit.She is status hospitalization from 12/21/2021 to 01/03/2022 for COVID-positive.  She was status post skilled nursing facility discharge.  Daughter reports that prior to discharge from skilled she had low blood pressure and a variable heart rate she was  concerned she might be septic again so she took her to the emergency room.  In the ED she was found to be hypothermic and bradycardic.  She was placed on Bair hugger and work-up revealed COVID-positive she also had a checks x-ray that showed bilateral infiltrates.  She was treated with a 2 days of IV remdesivir and IV infusion heparin.  She had A-fib RVR.  She is a poor candidate for Lovenox and DOAC. her hypothermia resolved thought likely due to sepsis.  Also had prolonged QT.  Recommended to avoid prolonged QT medication. ? ?She was started on meds midodrine and Lasix was held due to worsening renal function and hypotension.  Her Coreg was resumed and Lasix was resume as needed for nausea extremity edema. ? ?Chronic kidney disease stage V she was seen by nephrology is not a candidate for dialysis.  Patient is a very poor prognosis palliative care was consulted.  She was resumed on oral bicarbonate tablets for metabolic acidosis Foley catheter was placed to follow-up with outpatient urology. ? ?She received 2 units of packed blood cells and thrombocytopenia improved..  Also had hypocalcemia which became stable with corrected by albumin 8.5 ? ?TSH level was 7.6134 recheck recommended in 4 weeks. ? ?She was discharged with [facility] home health Physical Therapy and Nurse.  Daughter states patient was seen by the nurse on Saturday.  Still awaiting for physical therapy.  Has not gotten the patient out of bed but plans to try and sit up at the bedside today. ?Had small skin breakdown on her  bottom but daughter states she has been applying  skin protective ointment and repositioning her so wound has healed.  ? ?Daughter request wheelchair to be able to transport patient and for use within the house while completing her ADLs.She will require DME standard wheelchair to allow her to maintain current level of independence with ADL's which cannot be achieved with walker or cane. Patient suffers from chronic systolic and  diastolic congestive Heart failure and unsteady gait  which impairs their ability to perform daily activities like bathing,walking,dressing,grooming and toileting in the home. ? ?Past Medical History:  ?Diagnosis Date  ? Chronic combined systolic and diastolic congestive heart failure ([place]) 11/09/2021  ? Generalized anxiety disorder   ? Hyperlipidemia 11/09/2021  ? Hypertension   ? Stage 3b chronic kidney disease (CKD) ([place]) 11/09/2021  ? ?Past Surgical History:  ?Procedure Laterality Date  ? CATARACT EXTRACTION, BILATERAL    ? combined systolic and diastolic congestive heart failure     ? ? ?Allergies  ?Allergen Reactions  ? Chlorhexidine Other (See Comments)  ?  Unknown reaction  ? ? ?Outpatient Encounter Medications as of 01/08/2022  ?Medication Sig  ? Ascorbic Acid (VITAMIN C PO) Take 1 tablet by mouth daily after supper. Crush and mix with applesauce  ? carvedilol (COREG) 3.125 MG tablet Take 1 tablet (3.125 mg total) by mouth 2 (two) times daily with a meal.  ? folic acid (FOLVITE) 1 MG tablet Take 1 mg by mouth daily.  ? furosemide (LASIX) 40 MG tablet Take daily as needed for edema.  ? midodrine (PROAMATINE) 5 MG tablet Take 1 tablet (5 mg total) by mouth 3 (three) times daily with meals.  ? Multiple Vitamin (MULTIVITAMIN WITH MINERALS) TABS tablet Take 1 tablet by mouth daily.  ? sodium bicarbonate 650 MG tablet Take 1 tablet (650 mg total) by mouth 3 (three) times daily.  ? Zinc 50 MG CAPS Take 1 capsule by mouth daily.  ? [DISCONTINUED] folic acid (FOLVITE) 1 MG tablet Take 1 tablet (1 mg total) by mouth daily.  ? [DISCONTINUED] Multiple Vitamins-Minerals (ZINC PO) Take 1 tablet by mouth daily after supper. Crush and mix with applesauce  ? ?No facility-administered encounter medications on file as of 01/08/2022.  ? ? ?Review of Systems  ?Constitutional:  Negative for appetite change, chills, fatigue, fever and unexpected weight change.  ?HENT:  Negative for congestion, dental problem, ear discharge, ear  pain, facial swelling, hearing loss, nosebleeds, postnasal drip, rhinorrhea, sinus pressure, sinus pain, sneezing, sore throat, tinnitus and trouble swallowing.   ?Eyes:  Negative for pain, discharge, redness, itching and visual disturbance.  ?Respiratory:  Negative for cough, chest tightness, shortness of breath and wheezing.   ?Cardiovascular:  Negative for chest pain, palpitations and leg swelling.  ?Gastrointestinal:  Negative for abdominal distention, abdominal pain, blood in stool, constipation, diarrhea, nausea and vomiting.  ?Endocrine: Negative for cold intolerance, heat intolerance, polydipsia, polyphagia and polyuria.  ?Genitourinary:  Negative for difficulty urinating, dysuria, flank pain, frequency and urgency.  ?Musculoskeletal:  Positive for gait problem. Negative for arthralgias, back pain, joint swelling, myalgias, neck pain and neck stiffness.  ?Skin:  Negative for color change, pallor, rash and wound.  ?Neurological:  Negative for dizziness, syncope, speech difficulty, weakness, light-headedness, numbness and headaches.  ?Hematological:  Does not bruise/bleed easily.  ?Psychiatric/Behavioral:  Negative for agitation, behavioral problems, confusion, hallucinations, self-injury, sleep disturbance and suicidal ideas. The patient is not nervous/anxious.   ? ?Immunization History  ?Administered Date(s) Administered  ? PFIZER Comirnaty(Gray Top)Covid-19 Tri-Sucrose  Vaccine 04/28/2021  ? PFIZER(Purple Top)SARS-COV-2 Vaccination 12/16/2019, 01/12/2020  ? Zoster Recombinat (Shingrix) 07/09/2021  ? ?Pertinent  Health Maintenance Due  ?Topic Date Due  ? COLONOSCOPY (Pts 45-5yrs Insurance coverage will need to be confirmed)  Never done  ? MAMMOGRAM  Never done  ? DEXA SCAN  Never done  ? INFLUENZA VACCINE  01/11/2022 (Originally 05/14/2021)  ? ? ?  01/01/2022  ? 10:00 PM 01/02/2022  ?  8:30 AM 01/02/2022  ? 10:00 PM 01/03/2022  ?  8:03 AM 01/08/2022  ? 11:42 AM  ?Fall Risk  ?Falls in the past year?     0  ?Was  there an injury with Fall?     0  ?Fall Risk Category Calculator     0  ?Fall Risk Category     Low  ?Patient Fall Risk Level High fall risk High fall risk High fall risk Moderate fall risk Low fall risk  ?Patient a

## 2022-01-09 ENCOUNTER — Telehealth: Payer: Self-pay | Admitting: *Deleted

## 2022-01-09 ENCOUNTER — Telehealth: Payer: Self-pay | Admitting: Cardiology

## 2022-01-09 DIAGNOSIS — E43 Unspecified severe protein-calorie malnutrition: Secondary | ICD-10-CM | POA: Diagnosis not present

## 2022-01-09 DIAGNOSIS — I130 Hypertensive heart and chronic kidney disease with heart failure and stage 1 through stage 4 chronic kidney disease, or unspecified chronic kidney disease: Secondary | ICD-10-CM | POA: Diagnosis not present

## 2022-01-09 DIAGNOSIS — R32 Unspecified urinary incontinence: Secondary | ICD-10-CM | POA: Diagnosis not present

## 2022-01-09 DIAGNOSIS — N1832 Chronic kidney disease, stage 3b: Secondary | ICD-10-CM | POA: Diagnosis not present

## 2022-01-09 DIAGNOSIS — Z9181 History of falling: Secondary | ICD-10-CM | POA: Diagnosis not present

## 2022-01-09 DIAGNOSIS — I5042 Chronic combined systolic (congestive) and diastolic (congestive) heart failure: Secondary | ICD-10-CM | POA: Diagnosis not present

## 2022-01-09 DIAGNOSIS — R5381 Other malaise: Secondary | ICD-10-CM | POA: Diagnosis not present

## 2022-01-09 DIAGNOSIS — G309 Alzheimer's disease, unspecified: Secondary | ICD-10-CM | POA: Diagnosis not present

## 2022-01-09 DIAGNOSIS — R159 Full incontinence of feces: Secondary | ICD-10-CM | POA: Diagnosis not present

## 2022-01-09 DIAGNOSIS — E785 Hyperlipidemia, unspecified: Secondary | ICD-10-CM | POA: Diagnosis not present

## 2022-01-09 NOTE — Telephone Encounter (Signed)
Received fax from [facility] stating they received order from [facility] to obtain labs (CBC and BMP) and they do not provide Home Health services.  ? ?Reviewed patient's chart and patient gets [place] from [facility], Faxed order to [facility] Fax: [phone] ?

## 2022-01-09 NOTE — Telephone Encounter (Signed)
I spoke with the [facility] nurse: ? ?[place] [phone]  ? ? ?She reports that the pts HR was 47, BP 132/64... her HR was 55 this morning which it typically is but her daughter went ahead and gave her the Coreg... she is asymptomatic and says she feels well... I have asked her to have the pt drink some added fluids, have her sit with her feel elevated... and to continue to monitor.   ? ?I will forward to Dr. Gardiner Rhyme for his review and any further recommendations.  ?

## 2022-01-09 NOTE — Telephone Encounter (Signed)
STAT if HR is under 50 or over 120 ?(normal HR is 60-100 beats per minute) ? ?What is your heart rate? 47 ? ?Do you have a log of your heart rate readings (document readings)?  ? ?Do you have any other symptoms?  ? ?

## 2022-01-10 ENCOUNTER — Telehealth: Payer: Self-pay | Admitting: Hematology and Oncology

## 2022-01-10 ENCOUNTER — Telehealth: Payer: Self-pay | Admitting: *Deleted

## 2022-01-10 DIAGNOSIS — E785 Hyperlipidemia, unspecified: Secondary | ICD-10-CM | POA: Diagnosis not present

## 2022-01-10 DIAGNOSIS — E43 Unspecified severe protein-calorie malnutrition: Secondary | ICD-10-CM | POA: Diagnosis not present

## 2022-01-10 DIAGNOSIS — I130 Hypertensive heart and chronic kidney disease with heart failure and stage 1 through stage 4 chronic kidney disease, or unspecified chronic kidney disease: Secondary | ICD-10-CM | POA: Diagnosis not present

## 2022-01-10 DIAGNOSIS — Z9181 History of falling: Secondary | ICD-10-CM | POA: Diagnosis not present

## 2022-01-10 DIAGNOSIS — I5042 Chronic combined systolic (congestive) and diastolic (congestive) heart failure: Secondary | ICD-10-CM | POA: Diagnosis not present

## 2022-01-10 DIAGNOSIS — G309 Alzheimer's disease, unspecified: Secondary | ICD-10-CM | POA: Diagnosis not present

## 2022-01-10 DIAGNOSIS — N1832 Chronic kidney disease, stage 3b: Secondary | ICD-10-CM | POA: Diagnosis not present

## 2022-01-10 DIAGNOSIS — R5381 Other malaise: Secondary | ICD-10-CM | POA: Diagnosis not present

## 2022-01-10 DIAGNOSIS — R32 Unspecified urinary incontinence: Secondary | ICD-10-CM | POA: Diagnosis not present

## 2022-01-10 DIAGNOSIS — R159 Full incontinence of feces: Secondary | ICD-10-CM | POA: Diagnosis not present

## 2022-01-10 NOTE — Telephone Encounter (Signed)
Shelda Pal with [facility] called requesting verbal orders for ST 1x5weeks.  ?Verbal orders given.  ?

## 2022-01-10 NOTE — Telephone Encounter (Signed)
Spoke with Stanton Kidney at [facility]. They only see patient once a week and has not checked on patient. I then called patient's number an LMTCB. Left message on daughter Adolm Joseph cell [phone]. ?

## 2022-01-10 NOTE — Telephone Encounter (Signed)
Would hold coreg, check BP twice daily for 1 week and call with results ?

## 2022-01-10 NOTE — Telephone Encounter (Signed)
Scheduled appt per 2/24 referral. Pt's daughter is aware of appt date and time. Pt's daughter is aware to arrive 15 mins prior to appt time and to bring and updated insurance card. Pt's daughter is aware of appt location.   ?

## 2022-01-11 DIAGNOSIS — R5381 Other malaise: Secondary | ICD-10-CM | POA: Diagnosis not present

## 2022-01-11 DIAGNOSIS — I130 Hypertensive heart and chronic kidney disease with heart failure and stage 1 through stage 4 chronic kidney disease, or unspecified chronic kidney disease: Secondary | ICD-10-CM | POA: Diagnosis not present

## 2022-01-11 DIAGNOSIS — I5042 Chronic combined systolic (congestive) and diastolic (congestive) heart failure: Secondary | ICD-10-CM | POA: Diagnosis not present

## 2022-01-11 DIAGNOSIS — E785 Hyperlipidemia, unspecified: Secondary | ICD-10-CM | POA: Diagnosis not present

## 2022-01-11 DIAGNOSIS — Z9181 History of falling: Secondary | ICD-10-CM | POA: Diagnosis not present

## 2022-01-11 DIAGNOSIS — R32 Unspecified urinary incontinence: Secondary | ICD-10-CM | POA: Diagnosis not present

## 2022-01-11 DIAGNOSIS — R159 Full incontinence of feces: Secondary | ICD-10-CM | POA: Diagnosis not present

## 2022-01-11 DIAGNOSIS — G309 Alzheimer's disease, unspecified: Secondary | ICD-10-CM | POA: Diagnosis not present

## 2022-01-11 DIAGNOSIS — N1832 Chronic kidney disease, stage 3b: Secondary | ICD-10-CM | POA: Diagnosis not present

## 2022-01-11 DIAGNOSIS — E43 Unspecified severe protein-calorie malnutrition: Secondary | ICD-10-CM | POA: Diagnosis not present

## 2022-01-11 NOTE — Telephone Encounter (Signed)
Per Dr. Elsie Amis midodrine and continue carvedilol.    Hold Coreg is SBP <100 or HR <50.  Daughter aware and will monitor BP and HR for the next week and call with readings.  ?

## 2022-01-11 NOTE — Telephone Encounter (Signed)
Spoke to daughter-aware of recommendations and verbalized understanding.    She is wondering if she needs to continue the midodrine.  She states this was started in the hospital for "floaters" when she stood up.   She monitors blood pressure and heart rate at home.   152/92 pulse 57 average per report.   Patient reports she continues to see "floaters" when she stands up quickly but not if she changes positions slowly.    Advised would send message to Dr. Gardiner Rhyme for recommendations.    ?

## 2022-01-14 ENCOUNTER — Telehealth: Payer: Self-pay | Admitting: *Deleted

## 2022-01-14 DIAGNOSIS — I130 Hypertensive heart and chronic kidney disease with heart failure and stage 1 through stage 4 chronic kidney disease, or unspecified chronic kidney disease: Secondary | ICD-10-CM | POA: Diagnosis not present

## 2022-01-14 DIAGNOSIS — E785 Hyperlipidemia, unspecified: Secondary | ICD-10-CM | POA: Diagnosis not present

## 2022-01-14 DIAGNOSIS — R32 Unspecified urinary incontinence: Secondary | ICD-10-CM | POA: Diagnosis not present

## 2022-01-14 DIAGNOSIS — Z9181 History of falling: Secondary | ICD-10-CM | POA: Diagnosis not present

## 2022-01-14 DIAGNOSIS — I5042 Chronic combined systolic (congestive) and diastolic (congestive) heart failure: Secondary | ICD-10-CM | POA: Diagnosis not present

## 2022-01-14 DIAGNOSIS — E43 Unspecified severe protein-calorie malnutrition: Secondary | ICD-10-CM | POA: Diagnosis not present

## 2022-01-14 DIAGNOSIS — G309 Alzheimer's disease, unspecified: Secondary | ICD-10-CM | POA: Diagnosis not present

## 2022-01-14 DIAGNOSIS — R5381 Other malaise: Secondary | ICD-10-CM | POA: Diagnosis not present

## 2022-01-14 DIAGNOSIS — R159 Full incontinence of feces: Secondary | ICD-10-CM | POA: Diagnosis not present

## 2022-01-14 DIAGNOSIS — N1832 Chronic kidney disease, stage 3b: Secondary | ICD-10-CM | POA: Diagnosis not present

## 2022-01-14 NOTE — Telephone Encounter (Signed)
[place] with [facility] Notified and agreed.  ?

## 2022-01-14 NOTE — Telephone Encounter (Signed)
Sicilla with [facility] called and left message on Clinical intake requesting verbal orders for PT 1X1, 2X3, 1X2.  ? ?Tried calling, LMOM to return call.  ?

## 2022-01-15 ENCOUNTER — Telehealth: Payer: Self-pay | Admitting: *Deleted

## 2022-01-15 NOTE — Telephone Encounter (Signed)
May give verbal orders for wound care. ?Please refax lab orders to Home health.  ?

## 2022-01-15 NOTE — Telephone Encounter (Signed)
Jade with [facility] called requesting orders for Wound Care. Stated that patient has a small skin tear on Right inner thigh.  ?Wound Care Order-typically uses Normal Saline, Pat Dry and Cover with Foam dressing.  ? ?Also,  ? ?Needing order for labs to be drawn, CBC and BMP (was faxed to [facility] on 01/09/2022) Never received.  ? ? ?Please Advise.  ?[facility] Fax: [phone] ?

## 2022-01-16 DIAGNOSIS — A419 Sepsis, unspecified organism: Secondary | ICD-10-CM | POA: Diagnosis not present

## 2022-01-16 DIAGNOSIS — R531 Weakness: Secondary | ICD-10-CM | POA: Diagnosis not present

## 2022-01-16 DIAGNOSIS — N185 Chronic kidney disease, stage 5: Secondary | ICD-10-CM | POA: Diagnosis not present

## 2022-01-16 DIAGNOSIS — N1832 Chronic kidney disease, stage 3b: Secondary | ICD-10-CM | POA: Diagnosis not present

## 2022-01-16 DIAGNOSIS — U071 COVID-19: Secondary | ICD-10-CM | POA: Diagnosis not present

## 2022-01-16 DIAGNOSIS — I5042 Chronic combined systolic (congestive) and diastolic (congestive) heart failure: Secondary | ICD-10-CM | POA: Diagnosis not present

## 2022-01-16 NOTE — Telephone Encounter (Signed)
Faxed message and lab orders to [facility] Fax:[phone] ?

## 2022-01-21 ENCOUNTER — Telehealth: Payer: Self-pay

## 2022-01-21 ENCOUNTER — Telehealth: Payer: Self-pay | Admitting: Cardiology

## 2022-01-21 DIAGNOSIS — I10 Essential (primary) hypertension: Secondary | ICD-10-CM

## 2022-01-21 DIAGNOSIS — I5042 Chronic combined systolic (congestive) and diastolic (congestive) heart failure: Secondary | ICD-10-CM

## 2022-01-21 NOTE — Telephone Encounter (Signed)
Called pharmacy and they state that pt has an rx ready to pick up for 6.25 so she will only need to cut in 1/2.  ? ?Called and notified Kara Mullins - [facility] she will call pt ?

## 2022-01-21 NOTE — Telephone Encounter (Signed)
Mary with [facility] called requesting orders to see patient for additional 1x wk for 3 weeks. Daughter also mentioned orders for lab work. On telephone encounter dated 01/16/2022 states that orders were faxed to [facility]. I do not see lab orders and orders never received by [facility]. Please advise. Stanton Kidney will take verbal for both. ? ?Message routed to Marlowe Sax, NP ?

## 2022-01-21 NOTE — Telephone Encounter (Signed)
May give verbal orders for [facility] to obtain CBC/diff and BMP  ?Dx:  ?I 10  ?I 50.42 ?

## 2022-01-21 NOTE — Telephone Encounter (Signed)
?  Pt c/o medication issue: ? ?1. Name of Medication: carvedilol (COREG) 3.125 MG tablet ? ?2. How are you currently taking this medication (dosage and times per day)? Take 1 tablet (3.125 mg total) by mouth 2 (two) times daily with a meal. ? ?3. Are you having a reaction (difficulty breathing--STAT)?  ? ?4. What is your medication issue? Stanton Kidney RN with [facility] calling, she said, pt's daughter picked up this prescription today for 12.5 mg tablet carvedilol. She said the daughter have to cut the pills into to  4 . She asked if the prescription can be changed to 6.25 mg so the pt can just cut it in half, she said for any questions to call her back  ? ?

## 2022-01-22 ENCOUNTER — Telehealth: Payer: Self-pay | Admitting: *Deleted

## 2022-01-22 DIAGNOSIS — S71101A Unspecified open wound, right thigh, initial encounter: Secondary | ICD-10-CM | POA: Diagnosis not present

## 2022-01-22 NOTE — Telephone Encounter (Signed)
I called and left message for [facility] given verbal orders for both lab work and additional weeks. ?

## 2022-01-22 NOTE — Telephone Encounter (Signed)
Approved.  

## 2022-01-22 NOTE — Telephone Encounter (Signed)
Patient daughter [name] called with Concerns: ? ?1.) Needs Note to Return back to work on 01/27/2022. ? ?2.) Wants to know if it is ok NOT to take patient to the [facility]. Stated that it is hard for the patient and if they cannot do anything for her, she wants to keep her comfortable.  ? ?3.) Patient hasn't been able to take the Sodium Bicarbonate but twice daily, the third one makes her Nauseated.  ? ?Please Advise.  ?

## 2022-01-22 NOTE — Telephone Encounter (Signed)
Okay to write letter to return to work on 01/27/2022. ?If comfort measures desired.I would recommended discussion for goals of care.Palliative referral. ?

## 2022-01-22 NOTE — Telephone Encounter (Signed)
Letter written and Pended for approval.  ?

## 2022-01-22 NOTE — Telephone Encounter (Signed)
Patient daughter, [name] notified and agreed. Stated that they already have Palliative, Dr. Mariea Clonts.  ? ?Letter left up front for pick up.  ?

## 2022-01-23 ENCOUNTER — Telehealth: Payer: Self-pay | Admitting: *Deleted

## 2022-01-23 DIAGNOSIS — I130 Hypertensive heart and chronic kidney disease with heart failure and stage 1 through stage 4 chronic kidney disease, or unspecified chronic kidney disease: Secondary | ICD-10-CM | POA: Diagnosis not present

## 2022-01-23 DIAGNOSIS — N1832 Chronic kidney disease, stage 3b: Secondary | ICD-10-CM | POA: Diagnosis not present

## 2022-01-23 DIAGNOSIS — R5381 Other malaise: Secondary | ICD-10-CM | POA: Diagnosis not present

## 2022-01-23 DIAGNOSIS — I5042 Chronic combined systolic (congestive) and diastolic (congestive) heart failure: Secondary | ICD-10-CM | POA: Diagnosis not present

## 2022-01-23 DIAGNOSIS — E785 Hyperlipidemia, unspecified: Secondary | ICD-10-CM | POA: Diagnosis not present

## 2022-01-23 DIAGNOSIS — E43 Unspecified severe protein-calorie malnutrition: Secondary | ICD-10-CM | POA: Diagnosis not present

## 2022-01-23 DIAGNOSIS — R159 Full incontinence of feces: Secondary | ICD-10-CM | POA: Diagnosis not present

## 2022-01-23 DIAGNOSIS — R32 Unspecified urinary incontinence: Secondary | ICD-10-CM | POA: Diagnosis not present

## 2022-01-23 DIAGNOSIS — G309 Alzheimer's disease, unspecified: Secondary | ICD-10-CM | POA: Diagnosis not present

## 2022-01-23 DIAGNOSIS — Z9181 History of falling: Secondary | ICD-10-CM | POA: Diagnosis not present

## 2022-01-23 NOTE — Telephone Encounter (Signed)
Received fax from [facility] for wound supplies.  ?Acct: [id number] ?Tape, Foam Dressing, Gauze and Cleanser.  ? ?Placed form in [name]'s folder to review and sign.  ?To be faxed back to [facility] Fax: [phone] ?

## 2022-01-24 ENCOUNTER — Encounter: Payer: Medicare (Managed Care) | Admitting: Hematology and Oncology

## 2022-01-24 ENCOUNTER — Other Ambulatory Visit: Payer: Medicare (Managed Care)

## 2022-01-28 ENCOUNTER — Ambulatory Visit: Payer: Medicare Other | Admitting: Cardiology

## 2022-01-28 DIAGNOSIS — E785 Hyperlipidemia, unspecified: Secondary | ICD-10-CM | POA: Diagnosis not present

## 2022-01-28 DIAGNOSIS — R159 Full incontinence of feces: Secondary | ICD-10-CM | POA: Diagnosis not present

## 2022-01-28 DIAGNOSIS — R32 Unspecified urinary incontinence: Secondary | ICD-10-CM | POA: Diagnosis not present

## 2022-01-28 DIAGNOSIS — G309 Alzheimer's disease, unspecified: Secondary | ICD-10-CM | POA: Diagnosis not present

## 2022-01-28 DIAGNOSIS — Z9181 History of falling: Secondary | ICD-10-CM | POA: Diagnosis not present

## 2022-01-28 DIAGNOSIS — I130 Hypertensive heart and chronic kidney disease with heart failure and stage 1 through stage 4 chronic kidney disease, or unspecified chronic kidney disease: Secondary | ICD-10-CM | POA: Diagnosis not present

## 2022-01-28 DIAGNOSIS — I5042 Chronic combined systolic (congestive) and diastolic (congestive) heart failure: Secondary | ICD-10-CM | POA: Diagnosis not present

## 2022-01-28 DIAGNOSIS — N1832 Chronic kidney disease, stage 3b: Secondary | ICD-10-CM | POA: Diagnosis not present

## 2022-01-28 DIAGNOSIS — E43 Unspecified severe protein-calorie malnutrition: Secondary | ICD-10-CM | POA: Diagnosis not present

## 2022-01-28 DIAGNOSIS — R5381 Other malaise: Secondary | ICD-10-CM | POA: Diagnosis not present

## 2022-01-30 DIAGNOSIS — R5381 Other malaise: Secondary | ICD-10-CM | POA: Diagnosis not present

## 2022-01-30 DIAGNOSIS — E785 Hyperlipidemia, unspecified: Secondary | ICD-10-CM | POA: Diagnosis not present

## 2022-01-30 DIAGNOSIS — R159 Full incontinence of feces: Secondary | ICD-10-CM | POA: Diagnosis not present

## 2022-01-30 DIAGNOSIS — I5042 Chronic combined systolic (congestive) and diastolic (congestive) heart failure: Secondary | ICD-10-CM | POA: Diagnosis not present

## 2022-01-30 DIAGNOSIS — E43 Unspecified severe protein-calorie malnutrition: Secondary | ICD-10-CM | POA: Diagnosis not present

## 2022-01-30 DIAGNOSIS — G309 Alzheimer's disease, unspecified: Secondary | ICD-10-CM | POA: Diagnosis not present

## 2022-01-30 DIAGNOSIS — Z9181 History of falling: Secondary | ICD-10-CM | POA: Diagnosis not present

## 2022-01-30 DIAGNOSIS — N1832 Chronic kidney disease, stage 3b: Secondary | ICD-10-CM | POA: Diagnosis not present

## 2022-01-30 DIAGNOSIS — R32 Unspecified urinary incontinence: Secondary | ICD-10-CM | POA: Diagnosis not present

## 2022-01-30 DIAGNOSIS — I130 Hypertensive heart and chronic kidney disease with heart failure and stage 1 through stage 4 chronic kidney disease, or unspecified chronic kidney disease: Secondary | ICD-10-CM | POA: Diagnosis not present

## 2022-01-31 DIAGNOSIS — I5042 Chronic combined systolic (congestive) and diastolic (congestive) heart failure: Secondary | ICD-10-CM | POA: Diagnosis not present

## 2022-01-31 DIAGNOSIS — R159 Full incontinence of feces: Secondary | ICD-10-CM | POA: Diagnosis not present

## 2022-01-31 DIAGNOSIS — N1832 Chronic kidney disease, stage 3b: Secondary | ICD-10-CM | POA: Diagnosis not present

## 2022-01-31 DIAGNOSIS — E43 Unspecified severe protein-calorie malnutrition: Secondary | ICD-10-CM | POA: Diagnosis not present

## 2022-01-31 DIAGNOSIS — I130 Hypertensive heart and chronic kidney disease with heart failure and stage 1 through stage 4 chronic kidney disease, or unspecified chronic kidney disease: Secondary | ICD-10-CM | POA: Diagnosis not present

## 2022-01-31 DIAGNOSIS — R5381 Other malaise: Secondary | ICD-10-CM | POA: Diagnosis not present

## 2022-01-31 DIAGNOSIS — G309 Alzheimer's disease, unspecified: Secondary | ICD-10-CM | POA: Diagnosis not present

## 2022-01-31 DIAGNOSIS — E785 Hyperlipidemia, unspecified: Secondary | ICD-10-CM | POA: Diagnosis not present

## 2022-01-31 DIAGNOSIS — Z9181 History of falling: Secondary | ICD-10-CM | POA: Diagnosis not present

## 2022-01-31 DIAGNOSIS — R32 Unspecified urinary incontinence: Secondary | ICD-10-CM | POA: Diagnosis not present

## 2022-02-04 DIAGNOSIS — E43 Unspecified severe protein-calorie malnutrition: Secondary | ICD-10-CM | POA: Diagnosis not present

## 2022-02-04 DIAGNOSIS — R32 Unspecified urinary incontinence: Secondary | ICD-10-CM | POA: Diagnosis not present

## 2022-02-04 DIAGNOSIS — R159 Full incontinence of feces: Secondary | ICD-10-CM | POA: Diagnosis not present

## 2022-02-04 DIAGNOSIS — G309 Alzheimer's disease, unspecified: Secondary | ICD-10-CM | POA: Diagnosis not present

## 2022-02-04 DIAGNOSIS — Z9181 History of falling: Secondary | ICD-10-CM | POA: Diagnosis not present

## 2022-02-04 DIAGNOSIS — I130 Hypertensive heart and chronic kidney disease with heart failure and stage 1 through stage 4 chronic kidney disease, or unspecified chronic kidney disease: Secondary | ICD-10-CM | POA: Diagnosis not present

## 2022-02-04 DIAGNOSIS — R5381 Other malaise: Secondary | ICD-10-CM | POA: Diagnosis not present

## 2022-02-04 DIAGNOSIS — E785 Hyperlipidemia, unspecified: Secondary | ICD-10-CM | POA: Diagnosis not present

## 2022-02-04 DIAGNOSIS — I5042 Chronic combined systolic (congestive) and diastolic (congestive) heart failure: Secondary | ICD-10-CM | POA: Diagnosis not present

## 2022-02-04 DIAGNOSIS — N1832 Chronic kidney disease, stage 3b: Secondary | ICD-10-CM | POA: Diagnosis not present

## 2022-02-05 ENCOUNTER — Telehealth: Payer: Self-pay | Admitting: *Deleted

## 2022-02-05 ENCOUNTER — Other Ambulatory Visit: Payer: Medicare (Managed Care) | Admitting: Internal Medicine

## 2022-02-05 DIAGNOSIS — R5381 Other malaise: Secondary | ICD-10-CM | POA: Diagnosis not present

## 2022-02-05 DIAGNOSIS — R630 Anorexia: Secondary | ICD-10-CM

## 2022-02-05 DIAGNOSIS — Z9181 History of falling: Secondary | ICD-10-CM | POA: Diagnosis not present

## 2022-02-05 DIAGNOSIS — N185 Chronic kidney disease, stage 5: Secondary | ICD-10-CM

## 2022-02-05 DIAGNOSIS — Z8616 Personal history of COVID-19: Secondary | ICD-10-CM

## 2022-02-05 DIAGNOSIS — N1832 Chronic kidney disease, stage 3b: Secondary | ICD-10-CM | POA: Diagnosis not present

## 2022-02-05 DIAGNOSIS — E43 Unspecified severe protein-calorie malnutrition: Secondary | ICD-10-CM | POA: Diagnosis not present

## 2022-02-05 DIAGNOSIS — R159 Full incontinence of feces: Secondary | ICD-10-CM | POA: Diagnosis not present

## 2022-02-05 DIAGNOSIS — R001 Bradycardia, unspecified: Secondary | ICD-10-CM

## 2022-02-05 DIAGNOSIS — Z515 Encounter for palliative care: Secondary | ICD-10-CM

## 2022-02-05 DIAGNOSIS — G309 Alzheimer's disease, unspecified: Secondary | ICD-10-CM | POA: Diagnosis not present

## 2022-02-05 DIAGNOSIS — E785 Hyperlipidemia, unspecified: Secondary | ICD-10-CM | POA: Diagnosis not present

## 2022-02-05 DIAGNOSIS — R32 Unspecified urinary incontinence: Secondary | ICD-10-CM | POA: Diagnosis not present

## 2022-02-05 DIAGNOSIS — I130 Hypertensive heart and chronic kidney disease with heart failure and stage 1 through stage 4 chronic kidney disease, or unspecified chronic kidney disease: Secondary | ICD-10-CM | POA: Diagnosis not present

## 2022-02-05 DIAGNOSIS — I9589 Other hypotension: Secondary | ICD-10-CM

## 2022-02-05 DIAGNOSIS — I5042 Chronic combined systolic (congestive) and diastolic (congestive) heart failure: Secondary | ICD-10-CM | POA: Diagnosis not present

## 2022-02-05 NOTE — Telephone Encounter (Signed)
Kara Mullins with [facility] called and left message on Clinical Intake requesting verbal orders for: ? ?1.)order to draw labs for CBC and CMP ? ?2.)order to change Urinary Cath.  ? ?3.) order to continue wound care ? ?Please Advise.  ?

## 2022-02-05 NOTE — Telephone Encounter (Signed)
[facility] Notified and agreed.  ?

## 2022-02-05 NOTE — Progress Notes (Signed)
[facility] Pall[facility] Care Follow-Up Visit Telephone: [phone]  Fax: [phone]   Date of encounter: 02/05/22 2:51 PM PATIENT NAME: Kara Mullins [address] [place] [address]   [phone] (home)  DOB: 01/16/1951 MRN: [id number] PRIMARY CARE PROVIDER:    [name], NP,  [place] [address] [phone]  REFERRING PROVIDER:   [name], NP [address] [place],   [address] [phone]  RESPONSIBLE PARTY:    Contact Information     Name Relation Home Work [place] Daughter   [phone]        I met face to face with patient and family in her home. Palliative Care was asked to follow this patient by consultation request of  [name], [name], NP to address advance care planning and complex medical decision making. This is follow-up visit.                                     ASSESSMENT AND PLAN / RECOMMENDATIONS:   Advance Care Planning/Goals of Care: Goals include to maximize quality of life and symptom management. Patient/health care surrogate gave his/her permission to discuss.Our advance care planning conversation included a discussion about:    The value and importance of advance care planning  Experiences with loved ones who have been seriously ill or have died  Exploration of personal, cultural or spiritual beliefs that might influence medical decisions  Exploration of goals of care in the event of a sudden injury or illness  Identification  of a healthcare agent  Review and updating or creation of an  advance directive document . Decision not to resuscitate or to de-escalate disease focused treatments due to poor prognosis. CODE STATUS:  FULL CODE, MOST completed today:   full code, but does not want to be put on a ventilator or return to the hospital; comfort measures, determine use or limitation when infection occurs, IVF trial, no feeding tube Pt very tearful and  not ready to accept that she is nearing end of life Misses gardening outside as she now spends her time in bed Continues to work with therapy and they have gotten her a walker now and bedside commode to see if she can move some more, but she continues to get hypotensive and dizzy easily.  Has foley.   Symptom Management/Plan: 1. CKD (chronic kidney disease) stage 5, GFR less than 15 ml/min (HCC) -not a candidate for HD -continues to make urine and has foley in place that is going to be changed by home health RN due to sludge in it -had skin tear from catheter on right thigh and then some reaction to the adhesive; improving and closed wound now -continue to monitor at home -appears to be hypervolemic and recommended administering diuretic due to pitting edema in lateral thighs/buttock region noted today  2. Other specified hypotension -has some orthostatic hypotension that has limited her mobility considerably, is on higher dose of coreg now than during our video visit nonetheless  3. Bradycardia -has also been a concern, on beta blocker  4. Anorexia -remains problematic, encouraged sips of shake through day, small frequent snacks, but appetite remains limited  5. Debility -continues therapy at home for this with next steps to transfer to bedside commode and take steps with new walker  -unclear how much progress she will make considering her hypotension, bradycardia and  dizziness limitations  6. Personal history of COVID-19 -led to her declining renal function and overal function  7. Palliative care by specialist -MOST completed today as above--did educate about reality of CPR, but still wanted attempt made thought other goals are more comfort-based -she is still working on accepting her mortality, it appears, she continues to pray and do therapy -monitor overtime for more uremic symptoms, decreased urine output, monitor hypotension, bradycardia and dizziness  Follow up Palliative  Care Visit: Palliative care will continue to follow for complex medical decision making, advance care planning, and clarification of goals. Return 04/04/2022 and prn.  This visit was coded based on medical decision making (MDM). 32 mins spent on ACP.  PPS: 30%  HOSPICE ELIGIBILITY/DIAGNOSIS:  Not ready mentally for hospice herself/CKD5  Chief Complaint: Follow-up palliative visit  HISTORY OF PRESENT ILLNESS:  Kara Mullins is a 71 y.o. year old female  with CKD5 not an HD candidate, hypotension, bradycardia,  chronic combined chf, afib, prior hypertensive urgency, pancytopenia, severe protein cal mal, hyperlipidemia, GAD, and anemia due to CKD5 as well as recent COVID infection that led to decline seen for in person palliative care follow-up primarily to complete MOST form.   Mental energy says get out and run, but physical energy is you better be still.  She will sit up and move side to side with therapy.  Got a walker today.  She sees bats/black spots going across when bp low.  [name] has parameters now to go by about when to let patient lie back down.    Misses planting flowers in her yard like she does every spring.  Her daughter has gone back to work at [employer] from 6-1  and her husband works third shift.  24 yo granddaughter also helps.    She will get anxious when she gets winded.  She did not bounce back after she lost her daughter to covid in 2020.    She is eating.  She's not real hungry but she does eat.  Will eat a few bites of different things.  She couldn't taste or smell for a while.  Stool has firmed up to be thicker than applesauce now.  Tried different shakes but she gets diarrhea--ok with a few sips here and there.  Smoothies with greek yogurt go pretty well.    Has only taken lasix once or twice since discharge.    She had walked with a cane prior to her covid.    History obtained from review of EMR, discussion with primary team, and interview with family, facility  staff/caregiver and/or Ms. Montefusco.  I reviewed available labs, medications, imaging, studies and related documents from the EMR.  Records reviewed and summarized above.   ROS  General: NAD EYES: denies vision changes ENMT: denies dysphagia Cardiovascular: denies chest pain, has DOE but not doing a lot of activity as bedbound Pulmonary: denies cough, has increased SOB Abdomen: endorses poor appetite, denies constipation, endorses incontinence of bowel GU: denies dysuria, foley in place draining dark yellow urine MSK:  has increased weakness--some improvement with therapy from hospital d/c,  no falls reported Skin: rash on right thigh improving from adhesive from catheter and dressing from where catheter caused skin tear Neurological: denies pain, denies insomnia Psych: Endorses positive mood--tries to stay positive, pray, but tearful about her mortality today Heme/lymph/immuno: denies bruises, abnormal bleeding  Physical Exam: Current and past weights:  224.87 on 01/03/22 was last time weighed as bedbound (appears less) Constitutional: NAD General: frail  appearing, obese  EYES: anicteric sclera, lids intact, no discharge  ENMT: intact hearing, oral mucous membranes moist, dentition intact CV: S1S2, brady, LE edema and baseline larger legs per daughter, but now has pitting of lateral thighs, buttock region 2+  Pulmonary: LCTA, no increased work of breathing, no cough, room air Abdomen: intake less than 25% with small bites here and there, normo-active BS + 4 quadrants, soft and non tender, mild ascites GU: deferred MSK: sarcopenia, moves all extremities, not currently ambulatory--working on transfers next  Skin: warm and dry, right thigh rash as above Neuro:  generalized weakness,  no cognitive impairment Psych: anxious affect, A and O x 3, tearful today, but very pleasant  Hem/lymph/immuno: no widespread bruising  CURRENT PROBLEM LIST:  Patient Active Problem List   Diagnosis Date  Noted   Pressure injury of skin 01/02/2022   Hypotension, unspecified 12/30/2021   Pneumonia due to COVID-19 virus 12/26/2021   Palliative care by specialist    Goals of care, counseling/discussion    General weakness    Sepsis ([place]) 12/22/2021   COVID-19 virus infection 12/22/2021   Atrial fibrillation ([place]) 12/22/2021   CKD (chronic kidney disease) stage 5, GFR less than 15 ml/min (HCC) 12/22/2021   Anemia of chronic kidney failure, stage 5 (HCC) 12/22/2021   Prolonged QT interval 12/22/2021   Debility 17/51/0258   Metabolic acidosis 52/77/8242   Severe sepsis ([place]) 11/19/2021   Hypothermia 11/09/2021   Hyperlipidemia 11/09/2021   Chronic combined systolic and diastolic congestive heart failure ([place]) 11/09/2021   Stage 3b chronic kidney disease (CKD) ([place]) 11/09/2021   Pancytopenia ([place]) 11/09/2021   Severe protein-calorie malnutrition ([place]) 11/09/2021   Acute combined systolic and diastolic HF (heart failure) ([place])    AKI (acute kidney injury) ([place])    CHF exacerbation ([place]) 03/06/2021   Essential hypertension    Hypertensive urgency 03/05/2021   Acute CHF (congestive heart failure) ([place]) 03/05/2021   Nausea and vomiting 03/05/2021   Elevated troponin level not due myocardial infarction 03/05/2021   Elevated d-dimer 03/05/2021   Generalized anxiety disorder 03/05/2021   Acute congestive heart failure ([place]) 03/05/2021   PAST MEDICAL HISTORY:  Active Ambulatory Problems    Diagnosis Date Noted   Hypertensive urgency 03/05/2021   Acute CHF (congestive heart failure) ([place]) 03/05/2021   Nausea and vomiting 03/05/2021   Elevated troponin level not due myocardial infarction 03/05/2021   Elevated d-dimer 03/05/2021   Generalized anxiety disorder 03/05/2021   Acute congestive heart failure ([place]) 03/05/2021   CHF exacerbation ([place]) 03/06/2021   Essential hypertension    AKI (acute kidney injury) ([place])    Acute combined systolic and diastolic HF (heart failure) ([place])     Hypothermia 11/09/2021   Hyperlipidemia 11/09/2021   Chronic combined systolic and diastolic congestive heart failure ([place]) 11/09/2021   Stage 3b chronic kidney disease (CKD) ([place]) 11/09/2021   Pancytopenia ([place]) 11/09/2021   Severe protein-calorie malnutrition ([place]) 11/09/2021   Severe sepsis ([place]) 11/19/2021   Debility 35/36/1443   Metabolic acidosis 15/40/0867   Sepsis ([place]) 12/22/2021   COVID-19 virus infection 12/22/2021   Atrial fibrillation ([place]) 12/22/2021   CKD (chronic kidney disease) stage 5, GFR less than 15 ml/min (HCC) 12/22/2021   Anemia of chronic kidney failure, stage 5 ([place]) 12/22/2021   Prolonged QT interval 12/22/2021   Palliative care by specialist    Goals of care, counseling/discussion    General weakness    Pneumonia due to COVID-19 virus 12/26/2021   Hypotension, unspecified 12/30/2021  Pressure injury of skin 01/02/2022   Resolved Ambulatory Problems    Diagnosis Date Noted   No Resolved Ambulatory Problems   Past Medical History:  Diagnosis Date   Hypertension    SOCIAL HX:  Social History   Tobacco Use   Smoking status: Never   Smokeless tobacco: Never  Substance Use Topics   Alcohol use: Not Currently     ALLERGIES:  Allergies  Allergen Reactions   Chlorhexidine Other (See Comments)    Unknown reaction     PERTINENT MEDICATIONS:  Outpatient Encounter Medications as of 02/05/2022  Medication Sig   Ascorbic Acid (VITAMIN C PO) Take 1 tablet by mouth daily after supper. Crush and mix with applesauce   carvedilol (COREG) 3.125 MG tablet Take 1 tablet (3.125 mg total) by mouth 2 (two) times daily with a meal.   folic acid (FOLVITE) 1 MG tablet Take 1 mg by mouth daily.   furosemide (LASIX) 40 MG tablet Take daily as needed for edema.   Multiple Vitamin (MULTIVITAMIN WITH MINERALS) TABS tablet Take 1 tablet by mouth daily.   Zinc 50 MG CAPS Take 1 capsule by mouth daily.   No facility-administered encounter medications on file as of  02/05/2022.   Thank you for the opportunity to participate in the care of Ms. Mairena.  The palliative care team will continue to follow. Please call our office at [phone] if we can be of additional assistance.   Hollace Kinnier, DO  COVID-19 PATIENT SCREENING TOOL Asked and negative response unless otherwise noted:  Have you had symptoms of covid, tested positive or been in contact with someone with symptoms/positive test in the past 5-10 days? no

## 2022-02-05 NOTE — Telephone Encounter (Signed)
May give verbal orders as requested.  ?

## 2022-02-06 ENCOUNTER — Telehealth: Payer: Self-pay

## 2022-02-06 DIAGNOSIS — R32 Unspecified urinary incontinence: Secondary | ICD-10-CM | POA: Diagnosis not present

## 2022-02-06 DIAGNOSIS — N1832 Chronic kidney disease, stage 3b: Secondary | ICD-10-CM | POA: Diagnosis not present

## 2022-02-06 DIAGNOSIS — R5381 Other malaise: Secondary | ICD-10-CM | POA: Diagnosis not present

## 2022-02-06 DIAGNOSIS — Z9181 History of falling: Secondary | ICD-10-CM | POA: Diagnosis not present

## 2022-02-06 DIAGNOSIS — G309 Alzheimer's disease, unspecified: Secondary | ICD-10-CM | POA: Diagnosis not present

## 2022-02-06 DIAGNOSIS — I5042 Chronic combined systolic (congestive) and diastolic (congestive) heart failure: Secondary | ICD-10-CM | POA: Diagnosis not present

## 2022-02-06 DIAGNOSIS — E43 Unspecified severe protein-calorie malnutrition: Secondary | ICD-10-CM | POA: Diagnosis not present

## 2022-02-06 DIAGNOSIS — R159 Full incontinence of feces: Secondary | ICD-10-CM | POA: Diagnosis not present

## 2022-02-06 DIAGNOSIS — I130 Hypertensive heart and chronic kidney disease with heart failure and stage 1 through stage 4 chronic kidney disease, or unspecified chronic kidney disease: Secondary | ICD-10-CM | POA: Diagnosis not present

## 2022-02-06 DIAGNOSIS — E785 Hyperlipidemia, unspecified: Secondary | ICD-10-CM | POA: Diagnosis not present

## 2022-02-06 NOTE — Telephone Encounter (Signed)
Shelda Pal with [facility] called and requested extension of therapy sessions. 1 time a week for 5 weeks.  Caleed and left verbal approval on vm. ?

## 2022-02-07 DIAGNOSIS — I130 Hypertensive heart and chronic kidney disease with heart failure and stage 1 through stage 4 chronic kidney disease, or unspecified chronic kidney disease: Secondary | ICD-10-CM | POA: Diagnosis not present

## 2022-02-07 DIAGNOSIS — I5022 Chronic systolic (congestive) heart failure: Secondary | ICD-10-CM | POA: Diagnosis not present

## 2022-02-07 DIAGNOSIS — N1832 Chronic kidney disease, stage 3b: Secondary | ICD-10-CM | POA: Diagnosis not present

## 2022-02-07 DIAGNOSIS — R5381 Other malaise: Secondary | ICD-10-CM | POA: Diagnosis not present

## 2022-02-07 DIAGNOSIS — I5042 Chronic combined systolic (congestive) and diastolic (congestive) heart failure: Secondary | ICD-10-CM | POA: Diagnosis not present

## 2022-02-07 DIAGNOSIS — E43 Unspecified severe protein-calorie malnutrition: Secondary | ICD-10-CM | POA: Diagnosis not present

## 2022-02-07 DIAGNOSIS — G309 Alzheimer's disease, unspecified: Secondary | ICD-10-CM | POA: Diagnosis not present

## 2022-02-07 DIAGNOSIS — R159 Full incontinence of feces: Secondary | ICD-10-CM | POA: Diagnosis not present

## 2022-02-07 DIAGNOSIS — Z9181 History of falling: Secondary | ICD-10-CM | POA: Diagnosis not present

## 2022-02-07 DIAGNOSIS — E785 Hyperlipidemia, unspecified: Secondary | ICD-10-CM | POA: Diagnosis not present

## 2022-02-07 DIAGNOSIS — R32 Unspecified urinary incontinence: Secondary | ICD-10-CM | POA: Diagnosis not present

## 2022-02-08 ENCOUNTER — Encounter: Payer: Self-pay | Admitting: Internal Medicine

## 2022-02-11 ENCOUNTER — Telehealth: Payer: Self-pay

## 2022-02-11 DIAGNOSIS — G309 Alzheimer's disease, unspecified: Secondary | ICD-10-CM | POA: Diagnosis not present

## 2022-02-11 DIAGNOSIS — N1832 Chronic kidney disease, stage 3b: Secondary | ICD-10-CM | POA: Diagnosis not present

## 2022-02-11 DIAGNOSIS — I130 Hypertensive heart and chronic kidney disease with heart failure and stage 1 through stage 4 chronic kidney disease, or unspecified chronic kidney disease: Secondary | ICD-10-CM | POA: Diagnosis not present

## 2022-02-11 DIAGNOSIS — R32 Unspecified urinary incontinence: Secondary | ICD-10-CM | POA: Diagnosis not present

## 2022-02-11 DIAGNOSIS — E785 Hyperlipidemia, unspecified: Secondary | ICD-10-CM | POA: Diagnosis not present

## 2022-02-11 DIAGNOSIS — E43 Unspecified severe protein-calorie malnutrition: Secondary | ICD-10-CM | POA: Diagnosis not present

## 2022-02-11 DIAGNOSIS — I5042 Chronic combined systolic (congestive) and diastolic (congestive) heart failure: Secondary | ICD-10-CM | POA: Diagnosis not present

## 2022-02-11 DIAGNOSIS — R159 Full incontinence of feces: Secondary | ICD-10-CM | POA: Diagnosis not present

## 2022-02-11 DIAGNOSIS — R5381 Other malaise: Secondary | ICD-10-CM | POA: Diagnosis not present

## 2022-02-11 DIAGNOSIS — Z9181 History of falling: Secondary | ICD-10-CM | POA: Diagnosis not present

## 2022-02-11 NOTE — Telephone Encounter (Signed)
Noted  

## 2022-02-11 NOTE — Telephone Encounter (Signed)
Incoming call received from [place] with [facility] requesting verbal orders for physical therapy   ? ?2 times a week for 3 weeks then 1 time a week for 5 weeks.  ? ?Verbal order was given ? ?Message will be sent to [name], [name], NP as a FYI.  ?

## 2022-02-12 DIAGNOSIS — R159 Full incontinence of feces: Secondary | ICD-10-CM | POA: Diagnosis not present

## 2022-02-12 DIAGNOSIS — I5042 Chronic combined systolic (congestive) and diastolic (congestive) heart failure: Secondary | ICD-10-CM | POA: Diagnosis not present

## 2022-02-12 DIAGNOSIS — Z9181 History of falling: Secondary | ICD-10-CM | POA: Diagnosis not present

## 2022-02-12 DIAGNOSIS — I130 Hypertensive heart and chronic kidney disease with heart failure and stage 1 through stage 4 chronic kidney disease, or unspecified chronic kidney disease: Secondary | ICD-10-CM | POA: Diagnosis not present

## 2022-02-12 DIAGNOSIS — E785 Hyperlipidemia, unspecified: Secondary | ICD-10-CM | POA: Diagnosis not present

## 2022-02-12 DIAGNOSIS — R32 Unspecified urinary incontinence: Secondary | ICD-10-CM | POA: Diagnosis not present

## 2022-02-12 DIAGNOSIS — E43 Unspecified severe protein-calorie malnutrition: Secondary | ICD-10-CM | POA: Diagnosis not present

## 2022-02-12 DIAGNOSIS — R5381 Other malaise: Secondary | ICD-10-CM | POA: Diagnosis not present

## 2022-02-12 DIAGNOSIS — N1832 Chronic kidney disease, stage 3b: Secondary | ICD-10-CM | POA: Diagnosis not present

## 2022-02-12 DIAGNOSIS — G309 Alzheimer's disease, unspecified: Secondary | ICD-10-CM | POA: Diagnosis not present

## 2022-02-18 DIAGNOSIS — G309 Alzheimer's disease, unspecified: Secondary | ICD-10-CM | POA: Diagnosis not present

## 2022-02-18 DIAGNOSIS — Z8701 Personal history of pneumonia (recurrent): Secondary | ICD-10-CM | POA: Diagnosis not present

## 2022-02-18 DIAGNOSIS — I4891 Unspecified atrial fibrillation: Secondary | ICD-10-CM | POA: Diagnosis not present

## 2022-02-18 DIAGNOSIS — I130 Hypertensive heart and chronic kidney disease with heart failure and stage 1 through stage 4 chronic kidney disease, or unspecified chronic kidney disease: Secondary | ICD-10-CM | POA: Diagnosis not present

## 2022-02-18 DIAGNOSIS — R41841 Cognitive communication deficit: Secondary | ICD-10-CM | POA: Diagnosis not present

## 2022-02-18 DIAGNOSIS — Z466 Encounter for fitting and adjustment of urinary device: Secondary | ICD-10-CM | POA: Diagnosis not present

## 2022-02-18 DIAGNOSIS — E669 Obesity, unspecified: Secondary | ICD-10-CM | POA: Diagnosis not present

## 2022-02-18 DIAGNOSIS — Z7401 Bed confinement status: Secondary | ICD-10-CM | POA: Diagnosis not present

## 2022-02-18 DIAGNOSIS — F0280 Dementia in other diseases classified elsewhere without behavioral disturbance: Secondary | ICD-10-CM | POA: Diagnosis not present

## 2022-02-18 DIAGNOSIS — R32 Unspecified urinary incontinence: Secondary | ICD-10-CM | POA: Diagnosis not present

## 2022-02-18 DIAGNOSIS — R159 Full incontinence of feces: Secondary | ICD-10-CM | POA: Diagnosis not present

## 2022-02-18 DIAGNOSIS — Z9181 History of falling: Secondary | ICD-10-CM | POA: Diagnosis not present

## 2022-02-18 DIAGNOSIS — Z8616 Personal history of COVID-19: Secondary | ICD-10-CM | POA: Diagnosis not present

## 2022-02-19 DIAGNOSIS — Z466 Encounter for fitting and adjustment of urinary device: Secondary | ICD-10-CM | POA: Diagnosis not present

## 2022-02-19 DIAGNOSIS — F0280 Dementia in other diseases classified elsewhere without behavioral disturbance: Secondary | ICD-10-CM | POA: Diagnosis not present

## 2022-02-19 DIAGNOSIS — Z8701 Personal history of pneumonia (recurrent): Secondary | ICD-10-CM | POA: Diagnosis not present

## 2022-02-19 DIAGNOSIS — R159 Full incontinence of feces: Secondary | ICD-10-CM | POA: Diagnosis not present

## 2022-02-19 DIAGNOSIS — G309 Alzheimer's disease, unspecified: Secondary | ICD-10-CM | POA: Diagnosis not present

## 2022-02-19 DIAGNOSIS — R41841 Cognitive communication deficit: Secondary | ICD-10-CM | POA: Diagnosis not present

## 2022-02-19 DIAGNOSIS — E669 Obesity, unspecified: Secondary | ICD-10-CM | POA: Diagnosis not present

## 2022-02-19 DIAGNOSIS — I130 Hypertensive heart and chronic kidney disease with heart failure and stage 1 through stage 4 chronic kidney disease, or unspecified chronic kidney disease: Secondary | ICD-10-CM | POA: Diagnosis not present

## 2022-02-19 DIAGNOSIS — R32 Unspecified urinary incontinence: Secondary | ICD-10-CM | POA: Diagnosis not present

## 2022-02-19 DIAGNOSIS — Z9181 History of falling: Secondary | ICD-10-CM | POA: Diagnosis not present

## 2022-02-19 DIAGNOSIS — Z8616 Personal history of COVID-19: Secondary | ICD-10-CM | POA: Diagnosis not present

## 2022-02-19 DIAGNOSIS — I4891 Unspecified atrial fibrillation: Secondary | ICD-10-CM | POA: Diagnosis not present

## 2022-02-19 DIAGNOSIS — Z7401 Bed confinement status: Secondary | ICD-10-CM | POA: Diagnosis not present

## 2022-02-22 DIAGNOSIS — Z9181 History of falling: Secondary | ICD-10-CM | POA: Diagnosis not present

## 2022-02-22 DIAGNOSIS — I130 Hypertensive heart and chronic kidney disease with heart failure and stage 1 through stage 4 chronic kidney disease, or unspecified chronic kidney disease: Secondary | ICD-10-CM | POA: Diagnosis not present

## 2022-02-22 DIAGNOSIS — E669 Obesity, unspecified: Secondary | ICD-10-CM | POA: Diagnosis not present

## 2022-02-22 DIAGNOSIS — Z466 Encounter for fitting and adjustment of urinary device: Secondary | ICD-10-CM | POA: Diagnosis not present

## 2022-02-22 DIAGNOSIS — R41841 Cognitive communication deficit: Secondary | ICD-10-CM | POA: Diagnosis not present

## 2022-02-22 DIAGNOSIS — I4891 Unspecified atrial fibrillation: Secondary | ICD-10-CM | POA: Diagnosis not present

## 2022-02-22 DIAGNOSIS — R32 Unspecified urinary incontinence: Secondary | ICD-10-CM | POA: Diagnosis not present

## 2022-02-22 DIAGNOSIS — Z8701 Personal history of pneumonia (recurrent): Secondary | ICD-10-CM | POA: Diagnosis not present

## 2022-02-22 DIAGNOSIS — F0280 Dementia in other diseases classified elsewhere without behavioral disturbance: Secondary | ICD-10-CM | POA: Diagnosis not present

## 2022-02-22 DIAGNOSIS — Z8616 Personal history of COVID-19: Secondary | ICD-10-CM | POA: Diagnosis not present

## 2022-02-22 DIAGNOSIS — R159 Full incontinence of feces: Secondary | ICD-10-CM | POA: Diagnosis not present

## 2022-02-22 DIAGNOSIS — Z7401 Bed confinement status: Secondary | ICD-10-CM | POA: Diagnosis not present

## 2022-02-22 DIAGNOSIS — G309 Alzheimer's disease, unspecified: Secondary | ICD-10-CM | POA: Diagnosis not present

## 2022-02-27 DIAGNOSIS — G309 Alzheimer's disease, unspecified: Secondary | ICD-10-CM | POA: Diagnosis not present

## 2022-02-27 DIAGNOSIS — Z466 Encounter for fitting and adjustment of urinary device: Secondary | ICD-10-CM | POA: Diagnosis not present

## 2022-02-27 DIAGNOSIS — Z8616 Personal history of COVID-19: Secondary | ICD-10-CM | POA: Diagnosis not present

## 2022-02-27 DIAGNOSIS — Z7401 Bed confinement status: Secondary | ICD-10-CM | POA: Diagnosis not present

## 2022-02-27 DIAGNOSIS — I130 Hypertensive heart and chronic kidney disease with heart failure and stage 1 through stage 4 chronic kidney disease, or unspecified chronic kidney disease: Secondary | ICD-10-CM | POA: Diagnosis not present

## 2022-02-27 DIAGNOSIS — R32 Unspecified urinary incontinence: Secondary | ICD-10-CM | POA: Diagnosis not present

## 2022-02-27 DIAGNOSIS — E669 Obesity, unspecified: Secondary | ICD-10-CM | POA: Diagnosis not present

## 2022-02-27 DIAGNOSIS — R159 Full incontinence of feces: Secondary | ICD-10-CM | POA: Diagnosis not present

## 2022-02-27 DIAGNOSIS — R41841 Cognitive communication deficit: Secondary | ICD-10-CM | POA: Diagnosis not present

## 2022-02-27 DIAGNOSIS — F0280 Dementia in other diseases classified elsewhere without behavioral disturbance: Secondary | ICD-10-CM | POA: Diagnosis not present

## 2022-02-27 DIAGNOSIS — Z9181 History of falling: Secondary | ICD-10-CM | POA: Diagnosis not present

## 2022-02-27 DIAGNOSIS — Z8701 Personal history of pneumonia (recurrent): Secondary | ICD-10-CM | POA: Diagnosis not present

## 2022-02-27 DIAGNOSIS — I4891 Unspecified atrial fibrillation: Secondary | ICD-10-CM | POA: Diagnosis not present

## 2022-03-05 DIAGNOSIS — Z466 Encounter for fitting and adjustment of urinary device: Secondary | ICD-10-CM | POA: Diagnosis not present

## 2022-03-05 DIAGNOSIS — R32 Unspecified urinary incontinence: Secondary | ICD-10-CM | POA: Diagnosis not present

## 2022-03-05 DIAGNOSIS — I4891 Unspecified atrial fibrillation: Secondary | ICD-10-CM | POA: Diagnosis not present

## 2022-03-05 DIAGNOSIS — Z8701 Personal history of pneumonia (recurrent): Secondary | ICD-10-CM | POA: Diagnosis not present

## 2022-03-05 DIAGNOSIS — I130 Hypertensive heart and chronic kidney disease with heart failure and stage 1 through stage 4 chronic kidney disease, or unspecified chronic kidney disease: Secondary | ICD-10-CM | POA: Diagnosis not present

## 2022-03-05 DIAGNOSIS — R41841 Cognitive communication deficit: Secondary | ICD-10-CM | POA: Diagnosis not present

## 2022-03-05 DIAGNOSIS — R159 Full incontinence of feces: Secondary | ICD-10-CM | POA: Diagnosis not present

## 2022-03-05 DIAGNOSIS — G309 Alzheimer's disease, unspecified: Secondary | ICD-10-CM | POA: Diagnosis not present

## 2022-03-05 DIAGNOSIS — F0280 Dementia in other diseases classified elsewhere without behavioral disturbance: Secondary | ICD-10-CM | POA: Diagnosis not present

## 2022-03-05 DIAGNOSIS — Z9181 History of falling: Secondary | ICD-10-CM | POA: Diagnosis not present

## 2022-03-05 DIAGNOSIS — E669 Obesity, unspecified: Secondary | ICD-10-CM | POA: Diagnosis not present

## 2022-03-05 DIAGNOSIS — Z7401 Bed confinement status: Secondary | ICD-10-CM | POA: Diagnosis not present

## 2022-03-05 DIAGNOSIS — Z8616 Personal history of COVID-19: Secondary | ICD-10-CM | POA: Diagnosis not present

## 2022-03-06 ENCOUNTER — Ambulatory Visit: Payer: Medicare Other | Admitting: Family

## 2022-03-06 ENCOUNTER — Ambulatory Visit: Payer: Medicare Other | Admitting: Family Medicine

## 2022-03-06 DIAGNOSIS — F0280 Dementia in other diseases classified elsewhere without behavioral disturbance: Secondary | ICD-10-CM | POA: Diagnosis not present

## 2022-03-06 DIAGNOSIS — Z8616 Personal history of COVID-19: Secondary | ICD-10-CM | POA: Diagnosis not present

## 2022-03-06 DIAGNOSIS — G309 Alzheimer's disease, unspecified: Secondary | ICD-10-CM | POA: Diagnosis not present

## 2022-03-06 DIAGNOSIS — I4891 Unspecified atrial fibrillation: Secondary | ICD-10-CM | POA: Diagnosis not present

## 2022-03-06 DIAGNOSIS — Z8701 Personal history of pneumonia (recurrent): Secondary | ICD-10-CM | POA: Diagnosis not present

## 2022-03-06 DIAGNOSIS — Z7401 Bed confinement status: Secondary | ICD-10-CM | POA: Diagnosis not present

## 2022-03-06 DIAGNOSIS — R41841 Cognitive communication deficit: Secondary | ICD-10-CM | POA: Diagnosis not present

## 2022-03-06 DIAGNOSIS — I130 Hypertensive heart and chronic kidney disease with heart failure and stage 1 through stage 4 chronic kidney disease, or unspecified chronic kidney disease: Secondary | ICD-10-CM | POA: Diagnosis not present

## 2022-03-06 DIAGNOSIS — Z9181 History of falling: Secondary | ICD-10-CM | POA: Diagnosis not present

## 2022-03-06 DIAGNOSIS — E669 Obesity, unspecified: Secondary | ICD-10-CM | POA: Diagnosis not present

## 2022-03-06 DIAGNOSIS — R159 Full incontinence of feces: Secondary | ICD-10-CM | POA: Diagnosis not present

## 2022-03-06 DIAGNOSIS — Z466 Encounter for fitting and adjustment of urinary device: Secondary | ICD-10-CM | POA: Diagnosis not present

## 2022-03-06 DIAGNOSIS — R32 Unspecified urinary incontinence: Secondary | ICD-10-CM | POA: Diagnosis not present

## 2022-03-08 DIAGNOSIS — Z8701 Personal history of pneumonia (recurrent): Secondary | ICD-10-CM | POA: Diagnosis not present

## 2022-03-08 DIAGNOSIS — R32 Unspecified urinary incontinence: Secondary | ICD-10-CM | POA: Diagnosis not present

## 2022-03-08 DIAGNOSIS — Z466 Encounter for fitting and adjustment of urinary device: Secondary | ICD-10-CM | POA: Diagnosis not present

## 2022-03-08 DIAGNOSIS — R159 Full incontinence of feces: Secondary | ICD-10-CM | POA: Diagnosis not present

## 2022-03-08 DIAGNOSIS — E669 Obesity, unspecified: Secondary | ICD-10-CM | POA: Diagnosis not present

## 2022-03-08 DIAGNOSIS — Z9181 History of falling: Secondary | ICD-10-CM | POA: Diagnosis not present

## 2022-03-08 DIAGNOSIS — Z8616 Personal history of COVID-19: Secondary | ICD-10-CM | POA: Diagnosis not present

## 2022-03-08 DIAGNOSIS — Z7401 Bed confinement status: Secondary | ICD-10-CM | POA: Diagnosis not present

## 2022-03-08 DIAGNOSIS — R41841 Cognitive communication deficit: Secondary | ICD-10-CM | POA: Diagnosis not present

## 2022-03-08 DIAGNOSIS — G309 Alzheimer's disease, unspecified: Secondary | ICD-10-CM | POA: Diagnosis not present

## 2022-03-08 DIAGNOSIS — I4891 Unspecified atrial fibrillation: Secondary | ICD-10-CM | POA: Diagnosis not present

## 2022-03-08 DIAGNOSIS — F0280 Dementia in other diseases classified elsewhere without behavioral disturbance: Secondary | ICD-10-CM | POA: Diagnosis not present

## 2022-03-08 DIAGNOSIS — I130 Hypertensive heart and chronic kidney disease with heart failure and stage 1 through stage 4 chronic kidney disease, or unspecified chronic kidney disease: Secondary | ICD-10-CM | POA: Diagnosis not present

## 2022-03-12 DIAGNOSIS — G309 Alzheimer's disease, unspecified: Secondary | ICD-10-CM | POA: Diagnosis not present

## 2022-03-12 DIAGNOSIS — Z466 Encounter for fitting and adjustment of urinary device: Secondary | ICD-10-CM | POA: Diagnosis not present

## 2022-03-12 DIAGNOSIS — E669 Obesity, unspecified: Secondary | ICD-10-CM | POA: Diagnosis not present

## 2022-03-12 DIAGNOSIS — R32 Unspecified urinary incontinence: Secondary | ICD-10-CM | POA: Diagnosis not present

## 2022-03-12 DIAGNOSIS — Z8701 Personal history of pneumonia (recurrent): Secondary | ICD-10-CM | POA: Diagnosis not present

## 2022-03-12 DIAGNOSIS — I4891 Unspecified atrial fibrillation: Secondary | ICD-10-CM | POA: Diagnosis not present

## 2022-03-12 DIAGNOSIS — R159 Full incontinence of feces: Secondary | ICD-10-CM | POA: Diagnosis not present

## 2022-03-12 DIAGNOSIS — Z7401 Bed confinement status: Secondary | ICD-10-CM | POA: Diagnosis not present

## 2022-03-12 DIAGNOSIS — R41841 Cognitive communication deficit: Secondary | ICD-10-CM | POA: Diagnosis not present

## 2022-03-12 DIAGNOSIS — I130 Hypertensive heart and chronic kidney disease with heart failure and stage 1 through stage 4 chronic kidney disease, or unspecified chronic kidney disease: Secondary | ICD-10-CM | POA: Diagnosis not present

## 2022-03-12 DIAGNOSIS — Z8616 Personal history of COVID-19: Secondary | ICD-10-CM | POA: Diagnosis not present

## 2022-03-12 DIAGNOSIS — Z9181 History of falling: Secondary | ICD-10-CM | POA: Diagnosis not present

## 2022-03-12 DIAGNOSIS — F0280 Dementia in other diseases classified elsewhere without behavioral disturbance: Secondary | ICD-10-CM | POA: Diagnosis not present

## 2022-03-13 DIAGNOSIS — Z7401 Bed confinement status: Secondary | ICD-10-CM | POA: Diagnosis not present

## 2022-03-13 DIAGNOSIS — I130 Hypertensive heart and chronic kidney disease with heart failure and stage 1 through stage 4 chronic kidney disease, or unspecified chronic kidney disease: Secondary | ICD-10-CM | POA: Diagnosis not present

## 2022-03-13 DIAGNOSIS — R159 Full incontinence of feces: Secondary | ICD-10-CM | POA: Diagnosis not present

## 2022-03-13 DIAGNOSIS — G309 Alzheimer's disease, unspecified: Secondary | ICD-10-CM | POA: Diagnosis not present

## 2022-03-13 DIAGNOSIS — Z9181 History of falling: Secondary | ICD-10-CM | POA: Diagnosis not present

## 2022-03-13 DIAGNOSIS — R32 Unspecified urinary incontinence: Secondary | ICD-10-CM | POA: Diagnosis not present

## 2022-03-13 DIAGNOSIS — F0280 Dementia in other diseases classified elsewhere without behavioral disturbance: Secondary | ICD-10-CM | POA: Diagnosis not present

## 2022-03-13 DIAGNOSIS — I4891 Unspecified atrial fibrillation: Secondary | ICD-10-CM | POA: Diagnosis not present

## 2022-03-13 DIAGNOSIS — R41841 Cognitive communication deficit: Secondary | ICD-10-CM | POA: Diagnosis not present

## 2022-03-13 DIAGNOSIS — Z8616 Personal history of COVID-19: Secondary | ICD-10-CM | POA: Diagnosis not present

## 2022-03-13 DIAGNOSIS — Z466 Encounter for fitting and adjustment of urinary device: Secondary | ICD-10-CM | POA: Diagnosis not present

## 2022-03-13 DIAGNOSIS — E669 Obesity, unspecified: Secondary | ICD-10-CM | POA: Diagnosis not present

## 2022-03-13 DIAGNOSIS — Z8701 Personal history of pneumonia (recurrent): Secondary | ICD-10-CM | POA: Diagnosis not present

## 2022-03-15 DIAGNOSIS — F0280 Dementia in other diseases classified elsewhere without behavioral disturbance: Secondary | ICD-10-CM | POA: Diagnosis not present

## 2022-03-15 DIAGNOSIS — I130 Hypertensive heart and chronic kidney disease with heart failure and stage 1 through stage 4 chronic kidney disease, or unspecified chronic kidney disease: Secondary | ICD-10-CM | POA: Diagnosis not present

## 2022-03-15 DIAGNOSIS — Z466 Encounter for fitting and adjustment of urinary device: Secondary | ICD-10-CM | POA: Diagnosis not present

## 2022-03-15 DIAGNOSIS — I4891 Unspecified atrial fibrillation: Secondary | ICD-10-CM | POA: Diagnosis not present

## 2022-03-15 DIAGNOSIS — R41841 Cognitive communication deficit: Secondary | ICD-10-CM | POA: Diagnosis not present

## 2022-03-15 DIAGNOSIS — E669 Obesity, unspecified: Secondary | ICD-10-CM | POA: Diagnosis not present

## 2022-03-15 DIAGNOSIS — Z8701 Personal history of pneumonia (recurrent): Secondary | ICD-10-CM | POA: Diagnosis not present

## 2022-03-15 DIAGNOSIS — G309 Alzheimer's disease, unspecified: Secondary | ICD-10-CM | POA: Diagnosis not present

## 2022-03-15 DIAGNOSIS — Z8616 Personal history of COVID-19: Secondary | ICD-10-CM | POA: Diagnosis not present

## 2022-03-15 DIAGNOSIS — Z9181 History of falling: Secondary | ICD-10-CM | POA: Diagnosis not present

## 2022-03-15 DIAGNOSIS — R32 Unspecified urinary incontinence: Secondary | ICD-10-CM | POA: Diagnosis not present

## 2022-03-15 DIAGNOSIS — Z7401 Bed confinement status: Secondary | ICD-10-CM | POA: Diagnosis not present

## 2022-03-15 DIAGNOSIS — R159 Full incontinence of feces: Secondary | ICD-10-CM | POA: Diagnosis not present

## 2022-03-18 DIAGNOSIS — Z8701 Personal history of pneumonia (recurrent): Secondary | ICD-10-CM | POA: Diagnosis not present

## 2022-03-18 DIAGNOSIS — F0280 Dementia in other diseases classified elsewhere without behavioral disturbance: Secondary | ICD-10-CM | POA: Diagnosis not present

## 2022-03-18 DIAGNOSIS — E669 Obesity, unspecified: Secondary | ICD-10-CM | POA: Diagnosis not present

## 2022-03-18 DIAGNOSIS — R32 Unspecified urinary incontinence: Secondary | ICD-10-CM | POA: Diagnosis not present

## 2022-03-18 DIAGNOSIS — Z8616 Personal history of COVID-19: Secondary | ICD-10-CM | POA: Diagnosis not present

## 2022-03-18 DIAGNOSIS — Z9181 History of falling: Secondary | ICD-10-CM | POA: Diagnosis not present

## 2022-03-18 DIAGNOSIS — I4891 Unspecified atrial fibrillation: Secondary | ICD-10-CM | POA: Diagnosis not present

## 2022-03-18 DIAGNOSIS — R159 Full incontinence of feces: Secondary | ICD-10-CM | POA: Diagnosis not present

## 2022-03-18 DIAGNOSIS — Z466 Encounter for fitting and adjustment of urinary device: Secondary | ICD-10-CM | POA: Diagnosis not present

## 2022-03-18 DIAGNOSIS — R41841 Cognitive communication deficit: Secondary | ICD-10-CM | POA: Diagnosis not present

## 2022-03-18 DIAGNOSIS — Z7401 Bed confinement status: Secondary | ICD-10-CM | POA: Diagnosis not present

## 2022-03-18 DIAGNOSIS — I130 Hypertensive heart and chronic kidney disease with heart failure and stage 1 through stage 4 chronic kidney disease, or unspecified chronic kidney disease: Secondary | ICD-10-CM | POA: Diagnosis not present

## 2022-03-18 DIAGNOSIS — G309 Alzheimer's disease, unspecified: Secondary | ICD-10-CM | POA: Diagnosis not present

## 2022-03-19 DIAGNOSIS — F0280 Dementia in other diseases classified elsewhere without behavioral disturbance: Secondary | ICD-10-CM | POA: Diagnosis not present

## 2022-03-19 DIAGNOSIS — E669 Obesity, unspecified: Secondary | ICD-10-CM | POA: Diagnosis not present

## 2022-03-19 DIAGNOSIS — R32 Unspecified urinary incontinence: Secondary | ICD-10-CM | POA: Diagnosis not present

## 2022-03-19 DIAGNOSIS — G309 Alzheimer's disease, unspecified: Secondary | ICD-10-CM | POA: Diagnosis not present

## 2022-03-19 DIAGNOSIS — I4891 Unspecified atrial fibrillation: Secondary | ICD-10-CM | POA: Diagnosis not present

## 2022-03-19 DIAGNOSIS — Z8701 Personal history of pneumonia (recurrent): Secondary | ICD-10-CM | POA: Diagnosis not present

## 2022-03-19 DIAGNOSIS — R159 Full incontinence of feces: Secondary | ICD-10-CM | POA: Diagnosis not present

## 2022-03-19 DIAGNOSIS — Z9181 History of falling: Secondary | ICD-10-CM | POA: Diagnosis not present

## 2022-03-19 DIAGNOSIS — Z7401 Bed confinement status: Secondary | ICD-10-CM | POA: Diagnosis not present

## 2022-03-19 DIAGNOSIS — Z466 Encounter for fitting and adjustment of urinary device: Secondary | ICD-10-CM | POA: Diagnosis not present

## 2022-03-19 DIAGNOSIS — Z8616 Personal history of COVID-19: Secondary | ICD-10-CM | POA: Diagnosis not present

## 2022-03-19 DIAGNOSIS — I130 Hypertensive heart and chronic kidney disease with heart failure and stage 1 through stage 4 chronic kidney disease, or unspecified chronic kidney disease: Secondary | ICD-10-CM | POA: Diagnosis not present

## 2022-03-19 DIAGNOSIS — R41841 Cognitive communication deficit: Secondary | ICD-10-CM | POA: Diagnosis not present

## 2022-03-22 ENCOUNTER — Telehealth: Payer: Self-pay | Admitting: Cardiology

## 2022-03-22 NOTE — Telephone Encounter (Signed)
Called to let pt's daughter know Dr. Gardiner Rhyme is rounding in the hospital today but we will get the message to him to determine if she can be seen virtually. No answer, left detailed message for her.

## 2022-03-22 NOTE — Telephone Encounter (Signed)
New Message:      Patient's daughter wants to know if it is possible for patient to do a Virtual Visit for her appointment on 03-27-22.She said because of patient's condition, it is hard to transport her.

## 2022-03-25 NOTE — Telephone Encounter (Signed)
Yes that is okay  

## 2022-03-25 NOTE — Telephone Encounter (Signed)
Spoke to daughter, aware appt changed.

## 2022-03-27 ENCOUNTER — Encounter (HOSPITAL_COMMUNITY): Payer: Self-pay

## 2022-03-27 ENCOUNTER — Emergency Department (HOSPITAL_COMMUNITY): Payer: Medicare (Managed Care)

## 2022-03-27 ENCOUNTER — Inpatient Hospital Stay (HOSPITAL_COMMUNITY)
Admission: EM | Admit: 2022-03-27 | Discharge: 2022-04-08 | DRG: 291 | Disposition: A | Discharge status: Home Health | Payer: Medicare (Managed Care) | Attending: Family Medicine | Admitting: Family Medicine

## 2022-03-27 ENCOUNTER — Other Ambulatory Visit: Payer: Self-pay

## 2022-03-27 ENCOUNTER — Telehealth: Payer: Medicare (Managed Care) | Admitting: Cardiology

## 2022-03-27 DIAGNOSIS — I480 Paroxysmal atrial fibrillation: Secondary | ICD-10-CM | POA: Diagnosis present

## 2022-03-27 DIAGNOSIS — M898X9 Other specified disorders of bone, unspecified site: Secondary | ICD-10-CM | POA: Diagnosis present

## 2022-03-27 DIAGNOSIS — N185 Chronic kidney disease, stage 5: Secondary | ICD-10-CM | POA: Diagnosis present

## 2022-03-27 DIAGNOSIS — R5381 Other malaise: Secondary | ICD-10-CM | POA: Diagnosis present

## 2022-03-27 DIAGNOSIS — F411 Generalized anxiety disorder: Secondary | ICD-10-CM | POA: Diagnosis present

## 2022-03-27 DIAGNOSIS — R680 Hypothermia, not associated with low environmental temperature: Secondary | ICD-10-CM | POA: Diagnosis present

## 2022-03-27 DIAGNOSIS — R0602 Shortness of breath: Secondary | ICD-10-CM | POA: Diagnosis not present

## 2022-03-27 DIAGNOSIS — N049 Nephrotic syndrome with unspecified morphologic changes: Secondary | ICD-10-CM

## 2022-03-27 DIAGNOSIS — E876 Hypokalemia: Secondary | ICD-10-CM | POA: Diagnosis present

## 2022-03-27 DIAGNOSIS — L89151 Pressure ulcer of sacral region, stage 1: Secondary | ICD-10-CM | POA: Diagnosis present

## 2022-03-27 DIAGNOSIS — I700 Atherosclerosis of aorta: Secondary | ICD-10-CM | POA: Diagnosis present

## 2022-03-27 DIAGNOSIS — E43 Unspecified severe protein-calorie malnutrition: Secondary | ICD-10-CM | POA: Diagnosis present

## 2022-03-27 DIAGNOSIS — E785 Hyperlipidemia, unspecified: Secondary | ICD-10-CM | POA: Diagnosis present

## 2022-03-27 DIAGNOSIS — T68XXXA Hypothermia, initial encounter: Secondary | ICD-10-CM | POA: Diagnosis present

## 2022-03-27 DIAGNOSIS — R339 Retention of urine, unspecified: Secondary | ICD-10-CM | POA: Diagnosis present

## 2022-03-27 DIAGNOSIS — Z91148 Patient's other noncompliance with medication regimen for other reason: Secondary | ICD-10-CM

## 2022-03-27 DIAGNOSIS — I951 Orthostatic hypotension: Secondary | ICD-10-CM | POA: Diagnosis present

## 2022-03-27 DIAGNOSIS — I160 Hypertensive urgency: Secondary | ICD-10-CM | POA: Diagnosis present

## 2022-03-27 DIAGNOSIS — N179 Acute kidney failure, unspecified: Secondary | ICD-10-CM | POA: Diagnosis present

## 2022-03-27 DIAGNOSIS — E8809 Other disorders of plasma-protein metabolism, not elsewhere classified: Secondary | ICD-10-CM | POA: Diagnosis present

## 2022-03-27 DIAGNOSIS — M2550 Pain in unspecified joint: Secondary | ICD-10-CM | POA: Diagnosis not present

## 2022-03-27 DIAGNOSIS — D649 Anemia, unspecified: Secondary | ICD-10-CM | POA: Diagnosis not present

## 2022-03-27 DIAGNOSIS — Z8616 Personal history of COVID-19: Secondary | ICD-10-CM

## 2022-03-27 DIAGNOSIS — I130 Hypertensive heart and chronic kidney disease with heart failure and stage 1 through stage 4 chronic kidney disease, or unspecified chronic kidney disease: Secondary | ICD-10-CM | POA: Diagnosis not present

## 2022-03-27 DIAGNOSIS — Z515 Encounter for palliative care: Secondary | ICD-10-CM

## 2022-03-27 DIAGNOSIS — R001 Bradycardia, unspecified: Secondary | ICD-10-CM | POA: Diagnosis present

## 2022-03-27 DIAGNOSIS — R601 Generalized edema: Secondary | ICD-10-CM | POA: Diagnosis present

## 2022-03-27 DIAGNOSIS — Z7401 Bed confinement status: Secondary | ICD-10-CM

## 2022-03-27 DIAGNOSIS — N390 Urinary tract infection, site not specified: Secondary | ICD-10-CM | POA: Diagnosis present

## 2022-03-27 DIAGNOSIS — R8281 Pyuria: Secondary | ICD-10-CM | POA: Diagnosis not present

## 2022-03-27 DIAGNOSIS — Z79899 Other long term (current) drug therapy: Secondary | ICD-10-CM

## 2022-03-27 DIAGNOSIS — I10 Essential (primary) hypertension: Secondary | ICD-10-CM | POA: Diagnosis present

## 2022-03-27 DIAGNOSIS — Z6837 Body mass index (BMI) 37.0-37.9, adult: Secondary | ICD-10-CM

## 2022-03-27 DIAGNOSIS — I132 Hypertensive heart and chronic kidney disease with heart failure and with stage 5 chronic kidney disease, or end stage renal disease: Secondary | ICD-10-CM | POA: Diagnosis present

## 2022-03-27 DIAGNOSIS — E669 Obesity, unspecified: Secondary | ICD-10-CM | POA: Diagnosis present

## 2022-03-27 DIAGNOSIS — D631 Anemia in chronic kidney disease: Secondary | ICD-10-CM | POA: Diagnosis present

## 2022-03-27 DIAGNOSIS — E611 Iron deficiency: Secondary | ICD-10-CM | POA: Diagnosis present

## 2022-03-27 DIAGNOSIS — N184 Chronic kidney disease, stage 4 (severe): Secondary | ICD-10-CM | POA: Diagnosis not present

## 2022-03-27 DIAGNOSIS — I5033 Acute on chronic diastolic (congestive) heart failure: Secondary | ICD-10-CM | POA: Diagnosis present

## 2022-03-27 DIAGNOSIS — D72819 Decreased white blood cell count, unspecified: Secondary | ICD-10-CM | POA: Diagnosis present

## 2022-03-27 DIAGNOSIS — R5383 Other fatigue: Secondary | ICD-10-CM | POA: Diagnosis not present

## 2022-03-27 DIAGNOSIS — R9431 Abnormal electrocardiogram [ECG] [EKG]: Secondary | ICD-10-CM | POA: Diagnosis present

## 2022-03-27 DIAGNOSIS — J90 Pleural effusion, not elsewhere classified: Secondary | ICD-10-CM | POA: Diagnosis not present

## 2022-03-27 DIAGNOSIS — I248 Other forms of acute ischemic heart disease: Secondary | ICD-10-CM | POA: Diagnosis present

## 2022-03-27 DIAGNOSIS — E8779 Other fluid overload: Secondary | ICD-10-CM | POA: Diagnosis not present

## 2022-03-27 DIAGNOSIS — R7989 Other specified abnormal findings of blood chemistry: Secondary | ICD-10-CM | POA: Diagnosis present

## 2022-03-27 DIAGNOSIS — D61818 Other pancytopenia: Secondary | ICD-10-CM | POA: Diagnosis present

## 2022-03-27 DIAGNOSIS — Z888 Allergy status to other drugs, medicaments and biological substances status: Secondary | ICD-10-CM

## 2022-03-27 DIAGNOSIS — R778 Other specified abnormalities of plasma proteins: Secondary | ICD-10-CM | POA: Diagnosis present

## 2022-03-27 DIAGNOSIS — Z7189 Other specified counseling: Secondary | ICD-10-CM | POA: Diagnosis not present

## 2022-03-27 DIAGNOSIS — R609 Edema, unspecified: Secondary | ICD-10-CM | POA: Diagnosis not present

## 2022-03-27 DIAGNOSIS — T465X6A Underdosing of other antihypertensive drugs, initial encounter: Secondary | ICD-10-CM | POA: Diagnosis present

## 2022-03-27 DIAGNOSIS — I509 Heart failure, unspecified: Secondary | ICD-10-CM | POA: Diagnosis not present

## 2022-03-27 LAB — PROTEIN / CREATININE RATIO, URINE
Creatinine, Urine: 60.83 mg/dL
Protein Creatinine Ratio: 18.87 mg/mg{Cre} — ABNORMAL HIGH (ref 0.00–0.15)
Total Protein, Urine: 1148 mg/dL

## 2022-03-27 LAB — URINALYSIS, ROUTINE W REFLEX MICROSCOPIC
Bilirubin Urine: NEGATIVE
Glucose, UA: NEGATIVE mg/dL
Ketones, ur: NEGATIVE mg/dL
Nitrite: NEGATIVE
Protein, ur: >=300 mg/dL — AB
Specific Gravity, Urine: 1.014 (ref 1.005–1.030)
WBC, UA: >50 WBC/hpf — ABNORMAL HIGH (ref 0–5)
pH: 5 (ref 5.0–8.0)

## 2022-03-27 LAB — HEPATIC FUNCTION PANEL
ALT: 18 U/L (ref 0–44)
AST: 31 U/L (ref 15–41)
Albumin: <1.5 g/dL — ABNORMAL LOW (ref 3.5–5.0)
Alkaline Phosphatase: 134 U/L — ABNORMAL HIGH (ref 38–126)
Bilirubin, Direct: <0.1 mg/dL (ref 0.0–0.2)
Total Bilirubin: 0.5 mg/dL (ref 0.3–1.2)
Total Protein: 5 g/dL — ABNORMAL LOW (ref 6.5–8.1)

## 2022-03-27 LAB — CBC WITH DIFFERENTIAL/PLATELET
Abs Immature Granulocytes: 0.01 10*3/uL (ref 0.00–0.07)
Basophils Absolute: 0 10*3/uL (ref 0.0–0.1)
Basophils Relative: 1 %
Eosinophils Absolute: 0.1 10*3/uL (ref 0.0–0.5)
Eosinophils Relative: 2 %
HCT: 25.9 % — ABNORMAL LOW (ref 36.0–46.0)
Hemoglobin: 8.1 g/dL — ABNORMAL LOW (ref 12.0–15.0)
Immature Granulocytes: 0 %
Lymphocytes Relative: 32 %
Lymphs Abs: 0.9 10*3/uL (ref 0.7–4.0)
MCH: 29.9 pg (ref 26.0–34.0)
MCHC: 31.3 g/dL (ref 30.0–36.0)
MCV: 95.6 fL (ref 80.0–100.0)
Monocytes Absolute: 0.1 10*3/uL (ref 0.1–1.0)
Monocytes Relative: 4 %
Neutro Abs: 1.7 10*3/uL (ref 1.7–7.7)
Neutrophils Relative %: 61 %
Platelets: 151 10*3/uL (ref 150–400)
RBC: 2.71 MIL/uL — ABNORMAL LOW (ref 3.87–5.11)
RDW: 15.1 % (ref 11.5–15.5)
WBC: 2.7 10*3/uL — ABNORMAL LOW (ref 4.0–10.5)
nRBC: 0 % (ref 0.0–0.2)

## 2022-03-27 LAB — MAGNESIUM: Magnesium: 2.3 mg/dL (ref 1.7–2.4)

## 2022-03-27 LAB — LACTIC ACID, PLASMA: Lactic Acid, Venous: 0.8 mmol/L (ref 0.5–1.9)

## 2022-03-27 LAB — BASIC METABOLIC PANEL
Anion gap: 5 (ref 5–15)
BUN: 56 mg/dL — ABNORMAL HIGH (ref 8–23)
CO2: 23 mmol/L (ref 22–32)
Calcium: 7.1 mg/dL — ABNORMAL LOW (ref 8.9–10.3)
Chloride: 117 mmol/L — ABNORMAL HIGH (ref 98–111)
Creatinine, Ser: 3.06 mg/dL — ABNORMAL HIGH (ref 0.44–1.00)
GFR, Estimated: 16 mL/min — ABNORMAL LOW (ref >60)
Glucose, Bld: 97 mg/dL (ref 70–99)
Potassium: 3.6 mmol/L (ref 3.5–5.1)
Sodium: 145 mmol/L (ref 135–145)

## 2022-03-27 LAB — TROPONIN I (HIGH SENSITIVITY)
Troponin I (High Sensitivity): 25 ng/L — ABNORMAL HIGH (ref <18)
Troponin I (High Sensitivity): 23 ng/L — ABNORMAL HIGH (ref <18)

## 2022-03-27 LAB — APTT: aPTT: 40 seconds — ABNORMAL HIGH (ref 24–36)

## 2022-03-27 LAB — PROTIME-INR
INR: 1.1 (ref 0.8–1.2)
Prothrombin Time: 13.6 seconds (ref 11.4–15.2)

## 2022-03-27 LAB — TSH: TSH: 6.174 u[IU]/mL — ABNORMAL HIGH (ref 0.350–4.500)

## 2022-03-27 LAB — BRAIN NATRIURETIC PEPTIDE: B Natriuretic Peptide: 348.7 pg/mL — ABNORMAL HIGH (ref 0.0–100.0)

## 2022-03-27 MED ORDER — SODIUM CHLORIDE 0.9 % IV SOLN
1.0000 g | INTRAVENOUS | Status: DC
  Administered 2022-03-27 – 2022-03-30 (×4): 1 g via INTRAVENOUS
  Filled 2022-03-27 (×4): qty 10

## 2022-03-27 MED ORDER — SODIUM BICARBONATE 650 MG PO TABS
650.0000 mg | ORAL_TABLET | Freq: Two times a day (BID) | ORAL | Status: DC
  Administered 2022-03-27 – 2022-04-01 (×11): 650 mg via ORAL
  Filled 2022-03-27 (×11): qty 1

## 2022-03-27 MED ORDER — LOSARTAN POTASSIUM 25 MG PO TABS
25.0000 mg | ORAL_TABLET | Freq: Every day | ORAL | Status: DC
  Administered 2022-03-27 – 2022-04-02 (×7): 25 mg via ORAL
  Filled 2022-03-27 (×7): qty 1

## 2022-03-27 MED ORDER — FUROSEMIDE 10 MG/ML IJ SOLN
60.0000 mg | Freq: Every day | INTRAMUSCULAR | Status: DC
  Filled 2022-03-27: qty 8

## 2022-03-27 MED ORDER — FUROSEMIDE 10 MG/ML IJ SOLN
60.0000 mg | Freq: Three times a day (TID) | INTRAMUSCULAR | Status: DC
  Administered 2022-03-27 – 2022-03-29 (×7): 60 mg via INTRAVENOUS
  Filled 2022-03-27 (×2): qty 6
  Filled 2022-03-27: qty 8
  Filled 2022-03-27 (×3): qty 6
  Filled 2022-03-27: qty 8

## 2022-03-27 MED ORDER — ALBUMIN HUMAN 25 % IV SOLN
50.0000 g | Freq: Once | INTRAVENOUS | Status: AC
  Administered 2022-03-27: 50 g via INTRAVENOUS
  Filled 2022-03-27: qty 200

## 2022-03-27 MED ORDER — FUROSEMIDE 10 MG/ML IJ SOLN
40.0000 mg | Freq: Once | INTRAMUSCULAR | Status: AC
  Administered 2022-03-27: 40 mg via INTRAVENOUS
  Filled 2022-03-27: qty 4

## 2022-03-27 MED ORDER — CARVEDILOL 6.25 MG PO TABS
6.2500 mg | ORAL_TABLET | Freq: Two times a day (BID) | ORAL | Status: DC
  Administered 2022-03-27 – 2022-04-08 (×25): 6.25 mg via ORAL
  Filled 2022-03-27 (×16): qty 1
  Filled 2022-03-27: qty 2
  Filled 2022-03-27 (×2): qty 1
  Filled 2022-03-27: qty 2
  Filled 2022-03-27 (×5): qty 1

## 2022-03-27 MED ORDER — POTASSIUM CHLORIDE CRYS ER 20 MEQ PO TBCR
30.0000 meq | EXTENDED_RELEASE_TABLET | Freq: Three times a day (TID) | ORAL | Status: AC
  Administered 2022-03-27 (×2): 30 meq via ORAL
  Filled 2022-03-27 (×2): qty 1

## 2022-03-27 MED ORDER — FUROSEMIDE 10 MG/ML IJ SOLN
20.0000 mg | Freq: Once | INTRAMUSCULAR | Status: AC
  Administered 2022-03-27: 20 mg via INTRAVENOUS
  Filled 2022-03-27: qty 4

## 2022-03-27 MED ORDER — ACETAMINOPHEN 325 MG PO TABS: 650.0000 mg | ORAL_TABLET | Freq: Four times a day (QID) | ORAL | Status: DC | PRN

## 2022-03-27 MED ORDER — ACETAMINOPHEN 650 MG RE SUPP: 650.0000 mg | Freq: Four times a day (QID) | RECTAL | Status: DC | PRN

## 2022-03-27 NOTE — H&P (Signed)
History and Physical    Patient: Kara Mullins DXI:[id number] DOB: 1951/09/22 DOA: 03/27/2022 DOS: the patient was seen and examined on 03/27/2022 PCP: [name], [name], NP  Patient coming from: Home  Chief Complaint:  Chief Complaint  Patient presents with   Hypertension   HPI: Kara Mullins is a 71 y.o. female with medical history significant of paroxysmal atrial fibrillation, prolonged QT interval, hypothermia, chronic diastolic heart failure with her most recent EF of 55 to 60%, stage V CKD not on dialysis, hyperlipidemia, hypertension, COVID-19, generalized anxiety disorder, pancytopenia, history of urinary retention with chronic indwelling Foley catheter who was brought to the emergency department by her daughter due to elevated blood pressure measuring 220/150 mmHg at home due to the patient not taking her blood pressure medications.  Her daughter, gave her a tablet of carvedilol and brought her to the emergency department.  She has been basically bedbound at home, except for getting out of bed to a chair with assistance.  Her lower extremity edema has worsened considerably.  She and her daughter denied fever, chills, rhinorrhea, sore throat, wheezing or hemoptysis.  No chest pain, palpitations, diaphoresis, PND, orthopnea.  No abdominal pain, nausea, emesis, diarrhea, constipation, melena or hematochezia.  No flank pain, dysuria, frequency or hematuria.  No polyuria, polydipsia, polyphagia or blurred vision.   ED course: Initial vital signs were temperature 92.2 F, pulse 56, respirations 16, BP 155/115 mmHg and O2 sat 100% on room air.  She received furosemide 40 mg IVP.  I added another 20 mg of furosemide and 50 g of albumin 25%.  Full CODE STATUS confirmed.  Lab work: Her urinalysis was turbid with small hemoglobinuria, proteinuria more than 300 mg deciliter, large leukocyte esterase with more than 50 WBC and many bacteria and presence of WBC clumps. CBC with a white count 2.7,  hemoglobin 8.1 g/dL and platelets 151.  BMP showed a chloride of 117 mmol/L, the rest of the electrolyte and glucose level were normal once calcium is corrected.  BUN was 56 and creatinine 3.06 mg/dL.  Her previous creatinine measurements in March were between 5 and 5.5 mg/dL.  Troponin was 25 and then 23 ng/L.  BNP 348.7 pg/mL.  Lactic acid was normal.  LFTs showing a total protein of 5.0 and albumin of less than 1.5 g/dL.  Alkaline phosphatase 134 units/L.  Transaminases and bilirubin were normal.  TSH was 6.174 IU/mL.  Imaging: Portable 1 view chest radiograph with layering bilateral effusions with bibasilar atelectasis and infiltrate.  Review of Systems: As mentioned in the history of present illness. All other systems reviewed and are negative. Past Medical History:  Diagnosis Date   Chronic combined systolic and diastolic congestive heart failure ([place]) 11/09/2021   Generalized anxiety disorder    Hyperlipidemia 11/09/2021   Hypertension    Stage 3b chronic kidney disease (CKD) ([place]) 11/09/2021   Past Surgical History:  Procedure Laterality Date   CATARACT EXTRACTION, BILATERAL     combined systolic and diastolic congestive heart failure      Social History:  reports that she has never smoked. She has never used smokeless tobacco. She reports that she does not currently use alcohol. She reports that she does not use drugs.  Allergies  Allergen Reactions   Chlorhexidine Other (See Comments)    Unknown reaction   Rosuvastatin Other (See Comments)    "Felt like she couldn't swallow"    Family History  Problem Relation Age of Onset   Throat cancer Father  Prior to Admission medications   Medication Sig Start Date End Date Taking? Authorizing Provider  Ascorbic Acid (VITAMIN C PO) Take 1 tablet by mouth daily after supper. Crush and mix with applesauce   Yes [provider]  carvedilol (COREG) 6.25 MG tablet Take 6.25 mg by mouth 2 (two) times daily. 01/20/22  Yes  [provider]  furosemide (LASIX) 40 MG tablet Take daily as needed for edema. Patient taking differently: Take 40 mg by mouth daily. for swelling 01/02/22  Yes Regalado, Belkys A, MD  Multiple Vitamin (MULTIVITAMIN WITH MINERALS) TABS tablet Take 1 tablet by mouth daily. 11/24/21  Yes Pokhrel, Laxman, MD  sodium bicarbonate 650 MG tablet Take 650 mg by mouth 2 (two) times daily. 02/21/22  Yes [provider]  Zinc 50 MG CAPS Take 1 capsule by mouth daily.   Yes [provider]  losartan (COZAAR) 25 MG tablet Take 25 mg by mouth daily. Patient not taking: Reported on 03/27/2022 01/15/22   [provider]  spironolactone (ALDACTONE) 25 MG tablet Take 12.5 mg by mouth daily. Patient not taking: Reported on 03/27/2022 01/15/22   [provider]    Physical Exam: Vitals:   03/27/22 0800 03/27/22 0818 03/27/22 0830 03/27/22 0900  BP: 106/62 91/60 (!) 103/58 109/66  Pulse: 68 68 69 73  Resp: 13  12 14   Temp:      TempSrc:      SpO2: 99% 100% 100% 100%  Weight:      Height:       Physical Exam Vitals reviewed.  Constitutional:      General: She is awake. She is not in acute distress.    Appearance: Normal appearance. She is ill-appearing. She is not toxic-appearing or diaphoretic.  HENT:     Head: Normocephalic.     Mouth/Throat:     Mouth: Mucous membranes are dry.  Eyes:     General: No scleral icterus.    Pupils: Pupils are equal, round, and reactive to light.  Neck:     Vascular: No JVD.  Cardiovascular:     Rate and Rhythm: Normal rate and regular rhythm.     Heart sounds: S1 normal and S2 normal.     No gallop.  Pulmonary:     Breath sounds: Examination of the right-lower field reveals rales. Examination of the left-lower field reveals rales. Rales present.  Abdominal:     General: Bowel sounds are normal. There is no distension.     Palpations: Abdomen is soft.     Tenderness: There is no abdominal tenderness. There is no right CVA  tenderness, left CVA tenderness or guarding.  Musculoskeletal:     Right upper arm: Edema present.     Left upper arm: Edema present.     Cervical back: Neck supple.     Right lower leg: 4+ Pitting Edema present.     Left lower leg: 4+ Pitting Edema present.     Comments: Severe generalized weakness.  Neurological:     General: No focal deficit present.     Mental Status: She is alert and oriented to person, place, and time.  Psychiatric:        Mood and Affect: Mood normal.        Behavior: Behavior is cooperative.    Data Reviewed:  There are no new results to review at this time.  EKG: Vent. rate 56 BPM PR interval * ms QRS duration 82 ms QT/QTcB 592/572 ms P-R-T axes *  47 117 Sinus bradycardia Anterior infarct, old Nonspecific T abnormalities, lateral leads Prolonged QT interval  Assessment and Plan: Principal Problem:   Severe protein-calorie malnutrition (HCC) Due to severe proteinuria and presenting as:   Anasarca associated with disorder of kidney In the setting of:   CKD (chronic kidney disease) stage 5, GFR less than 15 ml/min (HCC) Observation/telemetry. Continue furosemide 60 mg IVP daily. Will resume low-dose losartan. Monitor intake and output. Monitor renal function electrolytes. Nephrology has been consulted.  Active Problems:   Anemia of chronic kidney failure, stage 5 (HCC) Monitor hematocrit hemoglobin. Erythropoietin per nephrology as needed.    Leukocytopenia Monitor WBC.    Hypothermia Resolved. Questionable UTI. Will cover with ceftriaxone pending UC&S.    Essential hypertension Presenting with:   Hypertensive urgency Continue carvedilol 6.25 mg p.o. twice daily. Resume losartan 25 mg p.o. daily. Monitor BP, HR, renal function electrolytes.    Elevated troponin level not due myocardial infarction Secondary to demand ischemia.    Prolonged QT interval Check magnesium level. Avoid QT prolonging meds.    Paroxysmal atrial  fibrillation (HCC) CHA?DS?-VASc Score of at least 5. (Age, female, HTN, diastolic CHF, aortic atherosclerosis). Not on anticoagulation. Continue beta-blocker for rate control.    Hyperlipidemia/aortic atherosclerosis. Has intolerance to statins.    Advance Care Planning:   Code Status: Full Code   Consults: Roney Jaffe, MD (nephrology).  Family Communication: Her daughter [name] was at bedside.  Severity of Illness: The appropriate patient status for this patient is OBSERVATION. Observation status is judged to be reasonable and necessary in order to provide the required intensity of service to ensure the patient's safety. The patient's presenting symptoms, physical exam findings, and initial radiographic and laboratory data in the context of their medical condition is felt to place them at decreased risk for further clinical deterioration. Furthermore, it is anticipated that the patient will be medically stable for discharge from the hospital within 2 midnights of admission.   Author: Reubin Milan, MD 03/27/2022 9:55 AM  For on call review [email or web address].   This document was prepared using Dragon voice recognition software and may contain some unintended transcription errors.

## 2022-03-27 NOTE — ED Notes (Signed)
ED TO INPATIENT HANDOFF REPORT  ED Nurse Name and Phone #:   S Name/Age/Gender Kara Mullins 71 y.o. female Room/Bed: WA23/WA23  Code Status   Code Status: Full Code  Home/SNF/Other Home Patient oriented to: self, place, time, and situation Is this baseline? Yes   Triage Complete: Triage complete  Chief Complaint Anasarca associated with disorder of kidney [N04.9]  Triage Note Pt BIB EMS from home due to elevated BP. family states BP was 220/150, pt had not taken BP meds. Family gave pt scheduled dose of carvedilol. Hx CH, pt reported SOB on scene, EMS placed pt on 3 L O2. SPO2 99% RA. Pt is bedbound.    Allergies Allergies  Allergen Reactions   Chlorhexidine Other (See Comments)    Unknown reaction   Rosuvastatin Other (See Comments)    "Felt like she couldn't swallow"    Level of Care/Admitting Diagnosis ED Disposition     ED Disposition  Admit   Condition  --   [place]: [place] [[id number]]  Level of Care: Progressive [102]  Admit to Progressive based on following criteria: NEPHROLOGY stable condition requiring close monitoring for AKI, requiring Hemodialysis or Peritoneal Dialysis either from expected electrolyte imbalance, acidosis, or fluid overload that can be managed by NIPPV or high flow oxygen.  May place patient in observation at [facility] or [facility] if equivalent level of care is available:: No  Covid Evaluation: Asymptomatic - no recent exposure (last 10 days) testing not required  Diagnosis: Anasarca associated with disorder of kidney [1660630]  Admitting Physician: Reubin Milan [[id number]]  Attending Physician: Reubin Milan [[id number]]          B Medical/Surgery History Past Medical History:  Diagnosis Date   Chronic combined systolic and diastolic congestive heart failure ([place]) 11/09/2021   Generalized anxiety disorder    Hyperlipidemia 11/09/2021   Hypertension    Stage 3b chronic  kidney disease (CKD) ([place]) 11/09/2021   Past Surgical History:  Procedure Laterality Date   CATARACT EXTRACTION, BILATERAL     combined systolic and diastolic congestive heart failure        A IV Location/Drains/Wounds Patient Lines/Drains/Airways Status     Active Line/Drains/Airways     Name Placement date Placement time Site Days   Peripheral IV 03/27/22 20 G Left Antecubital 03/27/22  0214  Antecubital  less than 1   Peripheral IV 03/27/22 20 G Right;Posterior Hand 03/27/22  0648  Hand  less than 1   Urethral Catheter Kady Paramedic Temperature probe 14 Fr. 12/22/21  0018  Temperature probe  95   Pressure Injury 12/30/21 Sacrum Mid Stage 2 -  Partial thickness loss of dermis presenting as a shallow open injury with a red, pink wound bed without slough. 12/30/21  1000  -- 87   Wound / Incision (Open or Dehisced) 03/13/21 Puncture Sacrum CT bone marrow biopsy 03/13/21  1543  Sacrum  379   Wound / Incision (Open or Dehisced) 11/12/21 (MASD) Moisture Associated Skin Damage Sacrum Left small area of moist, broken skin appearing moisture related 11/12/21  0900  Sacrum  135            Intake/Output Last 24 hours  Intake/Output Summary (Last 24 hours) at 03/27/2022 2310 Last data filed at 03/27/2022 2025 Gross per 24 hour  Intake --  Output 500 ml  Net -500 ml    Labs/Imaging Results for orders placed or performed during the hospital encounter of 03/27/22 (from the past  48 hour(s))  CBC with Differential     Status: Abnormal   Collection Time: 03/27/22  2:14 AM  Result Value Ref Range   WBC 2.7 (L) 4.0 - 10.5 K/uL   RBC 2.71 (L) 3.87 - 5.11 MIL/uL   Hemoglobin 8.1 (L) 12.0 - 15.0 g/dL   HCT 25.9 (L) 36.0 - 46.0 %   MCV 95.6 80.0 - 100.0 fL   MCH 29.9 26.0 - 34.0 pg   MCHC 31.3 30.0 - 36.0 g/dL   RDW 15.1 11.5 - 15.5 %   Platelets 151 150 - 400 K/uL   nRBC 0.0 0.0 - 0.2 %   Neutrophils Relative % 61 %   Neutro Abs 1.7 1.7 - 7.7 K/uL   Lymphocytes Relative 32 %    Lymphs Abs 0.9 0.7 - 4.0 K/uL   Monocytes Relative 4 %   Monocytes Absolute 0.1 0.1 - 1.0 K/uL   Eosinophils Relative 2 %   Eosinophils Absolute 0.1 0.0 - 0.5 K/uL   Basophils Relative 1 %   Basophils Absolute 0.0 0.0 - 0.1 K/uL   Immature Granulocytes 0 %   Abs Immature Granulocytes 0.01 0.00 - 0.07 K/uL    Comment: Performed at [facility], [place] [address], [place], [place] [address]  Troponin I (High Sensitivity)     Status: Abnormal   Collection Time: 03/27/22  2:14 AM  Result Value Ref Range   Troponin I (High Sensitivity) 25 (H) <18 ng/L    Comment: (NOTE) Elevated high sensitivity troponin I (hsTnI) values and significant  changes across serial measurements may suggest ACS but many other  chronic and acute conditions are known to elevate hsTnI results.  Refer to the "Links" section for chest pain algorithms and additional  guidance. Performed at [facility], [place] [address], [place], [place] [address]   Basic metabolic panel     Status: Abnormal   Collection Time: 03/27/22  2:14 AM  Result Value Ref Range   Sodium 145 135 - 145 mmol/L   Potassium 3.6 3.5 - 5.1 mmol/L   Chloride 117 (H) 98 - 111 mmol/L   CO2 23 22 - 32 mmol/L   Glucose, Bld 97 70 - 99 mg/dL    Comment: Glucose reference range applies only to samples taken after fasting for at least 8 hours.   BUN 56 (H) 8 - 23 mg/dL   Creatinine, Ser 3.06 (H) 0.44 - 1.00 mg/dL   Calcium 7.1 (L) 8.9 - 10.3 mg/dL   GFR, Estimated 16 (L) >60 mL/min    Comment: (NOTE) Calculated using the CKD-EPI Creatinine Equation (2021)    Anion gap 5 5 - 15    Comment: Performed at [facility], [place] [address], [place], [place] [address]  Brain natriuretic peptide     Status: Abnormal   Collection Time: 03/27/22  2:14 AM  Result Value Ref Range   B Natriuretic Peptide 348.7 (H) 0.0 - 100.0 pg/mL    Comment: Performed at [facility], [place] [address],  [place], [place] [address]  Protime-INR     Status: None   Collection Time: 03/27/22  2:14 AM  Result Value Ref Range   Prothrombin Time 13.6 11.4 - 15.2 seconds   INR 1.1 0.8 - 1.2    Comment: (NOTE) INR goal varies based on device and disease states. Performed at [facility], [place] [address], [place], [place] [address]   APTT     Status: Abnormal  Collection Time: 03/27/22  2:14 AM  Result Value Ref Range   aPTT 40 (H) 24 - 36 seconds    Comment:        IF BASELINE aPTT IS ELEVATED, SUGGEST PATIENT RISK ASSESSMENT BE USED TO DETERMINE APPROPRIATE ANTICOAGULANT THERAPY. Performed at [facility], [place] [address], [place], [place] [address]   Hepatic function panel     Status: Abnormal   Collection Time: 03/27/22  2:14 AM  Result Value Ref Range   Total Protein 5.0 (L) 6.5 - 8.1 g/dL   Albumin <1.5 (L) 3.5 - 5.0 g/dL   AST 31 15 - 41 U/L   ALT 18 0 - 44 U/L   Alkaline Phosphatase 134 (H) 38 - 126 U/L   Total Bilirubin 0.5 0.3 - 1.2 mg/dL   Bilirubin, Direct <0.1 0.0 - 0.2 mg/dL   Indirect Bilirubin NOT CALCULATED 0.3 - 0.9 mg/dL    Comment: Performed at [facility], [place] [address] [place], [place] [address]  Urinalysis, Routine w reflex microscopic     Status: Abnormal   Collection Time: 03/27/22  2:15 AM  Result Value Ref Range   Color, Urine YELLOW YELLOW   APPearance TURBID (A) CLEAR   Specific Gravity, Urine 1.014 1.005 - 1.030   pH 5.0 5.0 - 8.0   Glucose, UA NEGATIVE NEGATIVE mg/dL   Hgb urine dipstick SMALL (A) NEGATIVE   Bilirubin Urine NEGATIVE NEGATIVE   Ketones, ur NEGATIVE NEGATIVE mg/dL   Protein, ur >=300 (A) NEGATIVE mg/dL   Nitrite NEGATIVE NEGATIVE   Leukocytes,Ua LARGE (A) NEGATIVE   RBC / HPF 21-50 0 - 5 RBC/hpf   WBC, UA >50 (H) 0 - 5 WBC/hpf   Bacteria, UA MANY (A) NONE SEEN   Squamous Epithelial / LPF 0-5 0 - 5   WBC Clumps PRESENT    Mucus PRESENT    Hyaline Casts, UA PRESENT      Comment: Performed at [facility], [place] [address], [place], [place] [address]  Protein / creatinine ratio, urine     Status: Abnormal   Collection Time: 03/27/22  2:15 AM  Result Value Ref Range   Creatinine, Urine 60.83 mg/dL   Total Protein, Urine 1,148 mg/dL    Comment: NO NORMAL RANGE ESTABLISHED FOR THIS TEST RESULTS CONFIRMED BY MANUAL DILUTION    Protein Creatinine Ratio 18.87 (H) 0.00 - 0.15 mg/mg[Cre]    Comment: Performed at [place] [address] [place],  [address]  TSH     Status: Abnormal   Collection Time: 03/27/22  3:14 AM  Result Value Ref Range   TSH 6.174 (H) 0.350 - 4.500 uIU/mL    Comment: Performed by a 3rd Generation assay with a functional sensitivity of <=0.01 uIU/mL. Performed at [facility], [place] [address], [place], [place] [address]   Lactic acid, plasma     Status: None   Collection Time: 03/27/22  3:14 AM  Result Value Ref Range   Lactic Acid, Venous 0.8 0.5 - 1.9 mmol/L    Comment: Performed at [facility], [place] [address] [place], [place] [address]  Troponin I (High Sensitivity)     Status: Abnormal   Collection Time: 03/27/22  3:14 AM  Result Value Ref Range   Troponin I (High Sensitivity) 23 (H) <18 ng/L    Comment: (NOTE) Elevated high sensitivity troponin I (hsTnI) values and significant  changes across serial measurements may suggest ACS but many  other  chronic and acute conditions are known to elevate hsTnI results.  Refer to the "Links" section for chest pain algorithms and additional  guidance. Performed at [facility], [place] [address], [place], [place] [address]   Magnesium     Status: None   Collection Time: 03/27/22  3:14 AM  Result Value Ref Range   Magnesium 2.3 1.7 - 2.4 mg/dL    Comment: Performed at [facility], [place] [address], [place], [place] [address]   DG Chest Portable 1 View  Result Date:  03/27/2022 CLINICAL DATA:  Shortness of breath, hypertension EXAM: PORTABLE CHEST 1 VIEW COMPARISON:  12/22/2021 FINDINGS: Layering bilateral pleural effusions. Bibasilar atelectasis or infiltrates. Heart is normal size. No acute bony abnormality. IMPRESSION: Layering bilateral effusions with bibasilar atelectasis or infiltrates. Electronically Signed   By: Rolm Baptise M.D.   On: 03/27/2022 02:29    Pending Labs Unresulted Labs (From admission, onward)     Start     Ordered   03/28/22 0500  CBC  Tomorrow morning,   R        03/27/22 0739   03/28/22 0500  Renal function panel  Daily,   R      03/27/22 0739   03/27/22 0307  Blood Culture (routine x 2)  (Undifferentiated presentation (screening labs and basic nursing orders))  BLOOD CULTURE X 2,   STAT      03/27/22 0307   03/27/22 0307  Urine Culture  (Undifferentiated presentation (screening labs and basic nursing orders))  ONCE - URGENT,   URGENT       Question:  Indication  Answer:  Sepsis   03/27/22 0307            Vitals/Pain Today's Vitals   03/27/22 2100 03/27/22 2130 03/27/22 2200 03/27/22 2230  BP: 126/70 136/75 (!) 155/76 (!) 161/84  Pulse: 81 80 83 82  Resp: 11 13 13  (!) 5  Temp:      TempSrc:      SpO2: 100% 100% 100% 100%  Weight:      Height:      PainSc:        Isolation Precautions No active isolations  Medications Medications  acetaminophen (TYLENOL) tablet 650 mg (has no administration in time range)    Or  acetaminophen (TYLENOL) suppository 650 mg (has no administration in time range)  carvedilol (COREG) tablet 6.25 mg (6.25 mg Oral Given 03/27/22 2153)  losartan (COZAAR) tablet 25 mg (25 mg Oral Given 03/27/22 1201)  sodium bicarbonate tablet 650 mg (650 mg Oral Given 03/27/22 2209)  cefTRIAXone (ROCEPHIN) 1 g in sodium chloride 0.9 % 100 mL IVPB (0 g Intravenous Stopped 03/27/22 1237)  furosemide (LASIX) injection 60 mg (60 mg Intravenous Given 03/27/22 2154)  furosemide (LASIX) injection 40 mg (40 mg  Intravenous Given 03/27/22 0303)  albumin human 25 % solution 50 g (0 g Intravenous Stopped 03/27/22 1209)  furosemide (LASIX) injection 20 mg (20 mg Intravenous Given 03/27/22 0802)  potassium chloride (KLOR-CON M) CR tablet 30 mEq (30 mEq Oral Given 03/27/22 2153)    Mobility non-ambulatory Moderate fall risk   Focused Assessments     R Recommendations: See Admitting Provider Note  Report given to:   Additional Notes:

## 2022-03-27 NOTE — ED Triage Notes (Signed)
Pt BIB EMS from home due to elevated BP. family states BP was 220/150, pt had not taken BP meds. Family gave pt scheduled dose of carvedilol. Hx CH, pt reported SOB on scene, EMS placed pt on 3 L O2. SPO2 99% RA. Pt is bedbound.

## 2022-03-27 NOTE — ED Notes (Signed)
Assumed care of this pt from [place], [place]. Pt had bear hugger applied prior to my taking over.

## 2022-03-27 NOTE — Consult Note (Signed)
Renal Service Consult Note [facility]  Kara Mullins 03/27/2022 Kara Blazing, MD Requesting Physician: Dr. Olevia Mullins  Reason for Consult: Renal failure, anasarca HPI: The patient is a 71 y.o. year-old w/ hx of combined CHF, HTL, HTN, PAF and CKD brought to ED by daughter due to progressive leg swelling affecting her mobility, also high BP's on home machine at 220/150. Pt basically bedbound at home. In ED BP 155/115, anasarca, creat 3.0 (5.3 in march 2023). Pt given IV lasix. We are asked to see for renal failure.   Pt seen in ED, dtr states that her foley may have clogged over the weekend and that her leg swelling has progressed a lot and she is having more and more difficulty w/ getting up due to the swelling. Denies orthopnea or sig SOB at rest.  They have been taking lasix 40 mg po daily, was ordered prn but they have been taking it basically every day.    ROS - denies CP, no joint pain, no HA, no blurry vision, no rash, no diarrhea, no nausea/ vomiting, no dysuria, no difficulty voiding   Past Medical History  Past Medical History:  Diagnosis Date   Chronic combined systolic and diastolic congestive heart failure (HCC) 11/09/2021   Generalized anxiety disorder    Hyperlipidemia 11/09/2021   Hypertension    Stage 3b chronic kidney disease (CKD) ([place]) 11/09/2021   Past Surgical History  Past Surgical History:  Procedure Laterality Date   CATARACT EXTRACTION, BILATERAL     combined systolic and diastolic congestive heart failure      Family History  Family History  Problem Relation Age of Onset   Throat cancer Father    Social History  reports that she has never smoked. She has never used smokeless tobacco. She reports that she does not currently use alcohol. She reports that she does not use drugs. Allergies  Allergies  Allergen Reactions   Chlorhexidine Other (See Comments)    Unknown reaction   Rosuvastatin Other (See Comments)    "Felt like she  couldn't swallow"   Home medications Prior to Admission medications   Medication Sig Start Date End Date Taking? Authorizing Provider  Ascorbic Acid (VITAMIN C PO) Take 1 tablet by mouth daily after supper. Crush and mix with applesauce   Yes [provider]  carvedilol (COREG) 6.25 MG tablet Take 6.25 mg by mouth 2 (two) times daily. 01/20/22  Yes [provider]  furosemide (LASIX) 40 MG tablet Take daily as needed for edema. Patient taking differently: Take 40 mg by mouth daily. for swelling 01/02/22  Yes Mullins, Kara A, MD  Multiple Vitamin (MULTIVITAMIN WITH MINERALS) TABS tablet Take 1 tablet by mouth daily. 11/24/21  Yes Mullins, Laxman, MD  sodium bicarbonate 650 MG tablet Take 650 mg by mouth 2 (two) times daily. 02/21/22  Yes [provider]  Zinc 50 MG CAPS Take 1 capsule by mouth daily.   Yes [provider]  losartan (COZAAR) 25 MG tablet Take 25 mg by mouth daily. Patient not taking: Reported on 03/27/2022 01/15/22   [provider]  spironolactone (ALDACTONE) 25 MG tablet Take 12.5 mg by mouth daily. Patient not taking: Reported on 03/27/2022 01/15/22   [provider]     Vitals:   03/27/22 1330 03/27/22 1400 03/27/22 1430 03/27/22 1500  BP: 118/69 112/67 122/68 124/73  Pulse: 85 86 85 85  Resp: 11  10 12   Temp:      TempSrc:  SpO2: 100% 99% 100% 100%  Weight:      Height:       Exam Gen alert, no distress, elderly obese AAF lying in bed No rash, cyanosis or gangrene Sclera anicteric, throat clear  No jvd or bruits Chest occ rhonchi, no rales, no wheezing RRR no MRG Abd soft ntnd no mass or ascites +bs GU defer MS no joint effusions or deformity Ext diffuse bilat 3+ LE edema, also hips/ flanks/ breast pitting edema Neuro is alert, Ox3, gen'd weakness, debilitated   CXR - layering bilat effusions and bb atx vs infiltrate    Home meds include - coreg bid, lasix 40 qd, mvi, sod bicarb, zinc, losartan 25 qd,  aldactone 12.5 qd     UA > 300 protein, 21-50 rbc, > 50 wbc, many bacteria  Assessment/ Plan: CKD IV - w/ anasarca, decompensated due to renal failure, hypoalbuminemia, possibly diast CHF contributing, probably has nephrotic syndrome as well.  Will get UP/C ratio. Creat 3.0 here, which is better than low 5's at last admission in March.  Needs diuresis. As per last admission in March 2023, pt is poor candidate for immunosuppression and dialysis due to significant and progressive debility. Recommend supportive care w/ IV lasix. Consider changing lasix to torsemide at dc.  GOC - will consult palliative care to d/w patient her [place]. Currently full code.  HTN - cont ARB, coreg , diurese Chronic debility - basically bedbound  Pyuria - needs UCx  PAF - per pmd Anemia - chronic disease/ ckd, Hb 8.1. tranfuse prn.       Kara Splinter  MD 03/27/2022, 4:04 PM Recent Labs  Lab 03/27/22 0214  HGB 8.1*  ALBUMIN <1.5*  CALCIUM 7.1*  CREATININE 3.06*  K 3.6

## 2022-03-27 NOTE — ED Provider Notes (Addendum)
[facility] DEPT Provider Note   CSN: [id number] Arrival date & time: 03/27/22  0141     History  Chief Complaint  Patient presents with   Hypertension    Kara Mullins is a 71 y.o. female with history of stage V CKD not on dialysis, bradycardia who presents with concern for severe hypertension at home today as well as increased bilateral lower extremity swelling extending up to her abdomen with onset of shortness of breath this evening.  Patient is under palliative care with [facility] but is a full code based on documentation from 02/05/2022.  No chest pain, cough, fevers, or chills but states that she feels "like I been running in my chest".  In addition to the above listed history patient has history of CHF with last echocardiogram in January 2023 with a EF 55 to 60%.  History of hypertension, pancytopenia, A-fib, prolonged QT and history of admission for both sepsis and hypothermia.  She is not anticoagulated but is on diuretics with Lasix and spironolactone.  Patient is bedbound due to severity of lower extremity swelling with chronic indwelling Foley catheter for same.  She did miss her blood pressure medications this evening prior to documented a blood pressure of 220/150, family administered scheduled dose of carvedilol.  Patient placed on 3 L of naloxone in route by EMS for comfort though saturations were normal.  HPI     Home Medications Prior to Admission medications   Medication Sig Start Date End Date Taking? Authorizing Provider  Ascorbic Acid (VITAMIN C PO) Take 1 tablet by mouth daily after supper. Crush and mix with applesauce   Yes [provider]  carvedilol (COREG) 6.25 MG tablet Take 6.25 mg by mouth 2 (two) times daily. 01/20/22  Yes [provider]  furosemide (LASIX) 40 MG tablet Take daily as needed for edema. Patient taking differently: Take 40 mg by mouth daily. for swelling 01/02/22  Yes Regalado, Belkys A, MD   Multiple Vitamin (MULTIVITAMIN WITH MINERALS) TABS tablet Take 1 tablet by mouth daily. 11/24/21  Yes Pokhrel, Laxman, MD  sodium bicarbonate 650 MG tablet Take 650 mg by mouth 2 (two) times daily. 02/21/22  Yes [provider]  Zinc 50 MG CAPS Take 1 capsule by mouth daily.   Yes [provider]  losartan (COZAAR) 25 MG tablet Take 25 mg by mouth daily. Patient not taking: Reported on 03/27/2022 01/15/22   [provider]  spironolactone (ALDACTONE) 25 MG tablet Take 12.5 mg by mouth daily. Patient not taking: Reported on 03/27/2022 01/15/22   [provider]      Allergies    Chlorhexidine and Rosuvastatin    Review of Systems   Review of Systems  Constitutional:  Positive for fatigue.  Respiratory:  Positive for shortness of breath.   Cardiovascular:  Positive for leg swelling.    Physical Exam Updated Vital Signs BP 123/73 (BP Location: Right Arm)   Pulse 66   Temp (!) 95.9 F (35.5 C) (Oral)   Resp 20   Ht 5\' 5"  (1.651 m)   Wt 102 kg   SpO2 100%   BMI 37.42 kg/m  Physical Exam Vitals and nursing note reviewed.  Constitutional:      Appearance: She is not ill-appearing or toxic-appearing.  HENT:     Head: Normocephalic and atraumatic.     Nose: Nose normal.     Mouth/Throat:     Mouth: Mucous membranes are moist.     Pharynx: No  oropharyngeal exudate or posterior oropharyngeal erythema.  Eyes:     General:        Right eye: No discharge.        Left eye: No discharge.     Conjunctiva/sclera: Conjunctivae normal.  Cardiovascular:     Rate and Rhythm: Normal rate and regular rhythm.     Pulses: Normal pulses.     Heart sounds: Murmur heard.     Comments: Pitting edema extends from feet all the way up into the lateral inferior abdomen.  Faint palpable DP pulses bilaterally with normal capillary refill.  Questionable trace pericardial effusion but No gross wall motion abnormalities on bedside echocardiogram performed by EDP Dr.  Ralene Bathe Pulmonary:     Effort: Accessory muscle usage present. No tachypnea, bradypnea, prolonged expiration or respiratory distress.     Breath sounds: Examination of the right-middle field reveals rales. Examination of the left-middle field reveals rales. Examination of the right-lower field reveals rales. Examination of the left-lower field reveals rales. Rales present. No wheezing.  Abdominal:     General: Bowel sounds are normal. There is no distension.     Palpations: Abdomen is soft.     Tenderness: There is no abdominal tenderness. There is no left CVA tenderness, guarding or rebound.     Comments: Moderate ascites on bedside ultrasound performed by EDP Dr. Ralene Bathe  Musculoskeletal:        General: No deformity.     Cervical back: Neck supple.     Right lower leg: 4+ Pitting Edema present.     Left lower leg: 4+ Pitting Edema present.  Skin:    General: Skin is warm and dry.     Capillary Refill: Capillary refill takes less than 2 seconds.  Neurological:     General: No focal deficit present.     Mental Status: She is alert and oriented to person, place, and time. Mental status is at baseline.  Psychiatric:        Mood and Affect: Mood normal.     ED Results / Procedures / Treatments   Labs (all labs ordered are listed, but only abnormal results are displayed) Labs Reviewed  CBC WITH DIFFERENTIAL/PLATELET - Abnormal; Notable for the following components:      Result Value   WBC 2.7 (*)    RBC 2.71 (*)    Hemoglobin 8.1 (*)    HCT 25.9 (*)    All other components within normal limits  BASIC METABOLIC PANEL - Abnormal; Notable for the following components:   Chloride 117 (*)    BUN 56 (*)    Creatinine, Ser 3.06 (*)    Calcium 7.1 (*)    GFR, Estimated 16 (*)    All other components within normal limits  BRAIN NATRIURETIC PEPTIDE - Abnormal; Notable for the following components:   B Natriuretic Peptide 348.7 (*)    All other components within normal limits  URINALYSIS,  ROUTINE W REFLEX MICROSCOPIC - Abnormal; Notable for the following components:   APPearance TURBID (*)    Hgb urine dipstick SMALL (*)    Protein, ur >=300 (*)    Leukocytes,Ua LARGE (*)    WBC, UA >50 (*)    Bacteria, UA MANY (*)    All other components within normal limits  TSH - Abnormal; Notable for the following components:   TSH 6.174 (*)    All other components within normal limits  APTT - Abnormal; Notable for the following components:   aPTT 40 (*)  All other components within normal limits  HEPATIC FUNCTION PANEL - Abnormal; Notable for the following components:   Total Protein 5.0 (*)    Albumin <1.5 (*)    Alkaline Phosphatase 134 (*)    All other components within normal limits  TROPONIN I (HIGH SENSITIVITY) - Abnormal; Notable for the following components:   Troponin I (High Sensitivity) 25 (*)    All other components within normal limits  TROPONIN I (HIGH SENSITIVITY) - Abnormal; Notable for the following components:   Troponin I (High Sensitivity) 23 (*)    All other components within normal limits  CULTURE, BLOOD (ROUTINE X 2)  CULTURE, BLOOD (ROUTINE X 2)  URINE CULTURE  LACTIC ACID, PLASMA  PROTIME-INR    EKG EKG Interpretation  Date/Time:  Wednesday March 27 2022 03:22:15 EDT Ventricular Rate:  56 PR Interval:    QRS Duration: 82 QT Interval:  592 QTC Calculation: 572 R Axis:   47 Text Interpretation: Sinus bradycardia Anterior infarct, old Nonspecific T abnormalities, lateral leads Prolonged QT interval Confirmed by Quintella Reichert [phone]) on 03/27/2022 3:47:30 AM  Radiology DG Chest Portable 1 View  Result Date: 03/27/2022 CLINICAL DATA:  Shortness of breath, hypertension EXAM: PORTABLE CHEST 1 VIEW COMPARISON:  12/22/2021 FINDINGS: Layering bilateral pleural effusions. Bibasilar atelectasis or infiltrates. Heart is normal size. No acute bony abnormality. IMPRESSION: Layering bilateral effusions with bibasilar atelectasis or infiltrates.  Electronically Signed   By: Rolm Baptise M.D.   On: 03/27/2022 02:29    Procedures .Critical Care  Performed by: Emeline Darling, [name] Authorized by: Emeline Darling, [name]   Critical care provider statement:    Critical care time (minutes):  45   Critical care was time spent personally by me on the following activities:  Development of treatment plan with patient or surrogate, discussions with consultants, evaluation of patient's response to treatment, examination of patient, obtaining history from patient or surrogate, ordering and performing treatments and interventions, ordering and review of laboratory studies, ordering and review of radiographic studies, pulse oximetry and re-evaluation of patient's condition     Medications Ordered in ED Medications  furosemide (LASIX) injection 40 mg (40 mg Intravenous Given 03/27/22 0303)    ED Course/ Medical Decision Making/ A&P Clinical Course as of 03/27/22 0630  Wed Mar 27, 2022  0527 Consult to hospitalist Dr. Bridgett Larsson who states that patient will need to be seen by morning admitting team but that she has been accepted to medicine service at [facility] long today.  Appreciate his collaboration with this patient. [RS]    Clinical Course User Index [RS] Takara Sermons, Gypsy Balsam, [name]                           Medical Decision Making This is a 71 year old female on palliative care with comfort measures though she remains full code.  Presenting with shortness of breath and worsening swelling of the abdomen and lower extremities.  Hypothermic to 92 degrees rectally, bradycardic to the 50s, and hypertensive on intake.  Cardiopulmonary exam as above with rales in the lung bases.  4+ pitting edema extending from the feet proximally to the lower abdomen with abdominal ascites.  GCS of 15.  Different diagnosis includes was limited to CHF, myxedema, hepatic failure, hypoalbuminemia, MI, sepsis.   Amount and/or Complexity of Data Reviewed Labs:  ordered.    Details: CBC with leukopenia decreased from patient's baseline with white count 2.7 down from 5.9, anemia at patient's baseline.  BMP with creatinine of 3 improved from patient's previous creatinine of 5, no significant metabolic derangement.  BNP 348 improved from patient's baseline hepatic function panel with albumin less than 1.5, critical.  TSH mildly elevated to 6, doubt myxedema, troponin elevated to 25, 23-flat.  UA with many bacteria, greater than 50 WBCs proteinuria and small hemoglobin however chronic indwelling Foley and UA near patient's baseline. Radiology: ordered.    Details: Chest x-ray with bilateral pleural effusions with bibasilar infiltrates versus atelectasis. ECG/medicine tests:     Details: Very low voltage, sinus bradycardia, prolonged QT.  Risk Prescription drug management. Decision regarding hospitalization.   Patient administered Lasix due to significant amount of fluid retention, some urine output.  Patient had about 700 cc in the Foley bag approximately 3 hours after administration of Lasix, had ~450 cc urine in the Foley bag at time of arrival prior to Lasix administration.  Overall with patient's anasarca, hypothermia, and increasing ascites do feel she would benefit from admission to the hospital for further work-up and stabilization.  Overall patient's prognosis is poor given chronic kidney disease without candidacy for hemodialysis and worsening fluid status.  Celester and her daughter Baxter Flattery voiced understanding of her medical evaluation and treatment plan. Each of their questions answered to their expressed satisfaction.  Return precautions were given.  Patient is well-appearing, stable, and was discharged in good condition.  Consult to Dr. Bridgett Larsson, hospitalist as above.   This chart was dictated using voice recognition software, Dragon. Despite the best efforts of this provider to proofread and correct errors, errors may still occur which can change  documentation meaning.   Final Clinical Impression(s) / ED Diagnoses Final diagnoses:  Anasarca    Rx / DC Orders ED Discharge Orders     None          Aura Dials 03/27/22 0631    Quintella Reichert, MD 03/30/22 1444

## 2022-03-28 DIAGNOSIS — N184 Chronic kidney disease, stage 4 (severe): Secondary | ICD-10-CM | POA: Diagnosis not present

## 2022-03-28 DIAGNOSIS — E785 Hyperlipidemia, unspecified: Secondary | ICD-10-CM | POA: Diagnosis present

## 2022-03-28 DIAGNOSIS — D631 Anemia in chronic kidney disease: Secondary | ICD-10-CM

## 2022-03-28 DIAGNOSIS — N049 Nephrotic syndrome with unspecified morphologic changes: Secondary | ICD-10-CM | POA: Diagnosis not present

## 2022-03-28 DIAGNOSIS — I951 Orthostatic hypotension: Secondary | ICD-10-CM | POA: Diagnosis present

## 2022-03-28 DIAGNOSIS — I509 Heart failure, unspecified: Secondary | ICD-10-CM | POA: Diagnosis not present

## 2022-03-28 DIAGNOSIS — I132 Hypertensive heart and chronic kidney disease with heart failure and with stage 5 chronic kidney disease, or end stage renal disease: Secondary | ICD-10-CM | POA: Diagnosis present

## 2022-03-28 DIAGNOSIS — E669 Obesity, unspecified: Secondary | ICD-10-CM | POA: Diagnosis present

## 2022-03-28 DIAGNOSIS — Z8616 Personal history of COVID-19: Secondary | ICD-10-CM | POA: Diagnosis not present

## 2022-03-28 DIAGNOSIS — R680 Hypothermia, not associated with low environmental temperature: Secondary | ICD-10-CM | POA: Diagnosis present

## 2022-03-28 DIAGNOSIS — N179 Acute kidney failure, unspecified: Secondary | ICD-10-CM | POA: Diagnosis present

## 2022-03-28 DIAGNOSIS — R001 Bradycardia, unspecified: Secondary | ICD-10-CM | POA: Diagnosis present

## 2022-03-28 DIAGNOSIS — N185 Chronic kidney disease, stage 5: Secondary | ICD-10-CM

## 2022-03-28 DIAGNOSIS — I248 Other forms of acute ischemic heart disease: Secondary | ICD-10-CM | POA: Diagnosis present

## 2022-03-28 DIAGNOSIS — E8809 Other disorders of plasma-protein metabolism, not elsewhere classified: Secondary | ICD-10-CM | POA: Diagnosis present

## 2022-03-28 DIAGNOSIS — D649 Anemia, unspecified: Secondary | ICD-10-CM | POA: Diagnosis not present

## 2022-03-28 DIAGNOSIS — E8779 Other fluid overload: Secondary | ICD-10-CM | POA: Diagnosis not present

## 2022-03-28 DIAGNOSIS — R8281 Pyuria: Secondary | ICD-10-CM | POA: Diagnosis not present

## 2022-03-28 DIAGNOSIS — I5033 Acute on chronic diastolic (congestive) heart failure: Secondary | ICD-10-CM | POA: Diagnosis present

## 2022-03-28 DIAGNOSIS — D61818 Other pancytopenia: Secondary | ICD-10-CM | POA: Diagnosis present

## 2022-03-28 DIAGNOSIS — I480 Paroxysmal atrial fibrillation: Secondary | ICD-10-CM | POA: Diagnosis present

## 2022-03-28 DIAGNOSIS — I130 Hypertensive heart and chronic kidney disease with heart failure and stage 1 through stage 4 chronic kidney disease, or unspecified chronic kidney disease: Secondary | ICD-10-CM | POA: Diagnosis not present

## 2022-03-28 DIAGNOSIS — E43 Unspecified severe protein-calorie malnutrition: Secondary | ICD-10-CM | POA: Diagnosis present

## 2022-03-28 DIAGNOSIS — F411 Generalized anxiety disorder: Secondary | ICD-10-CM | POA: Diagnosis present

## 2022-03-28 DIAGNOSIS — R339 Retention of urine, unspecified: Secondary | ICD-10-CM | POA: Diagnosis present

## 2022-03-28 DIAGNOSIS — I700 Atherosclerosis of aorta: Secondary | ICD-10-CM | POA: Diagnosis present

## 2022-03-28 DIAGNOSIS — N390 Urinary tract infection, site not specified: Secondary | ICD-10-CM | POA: Diagnosis present

## 2022-03-28 DIAGNOSIS — L89151 Pressure ulcer of sacral region, stage 1: Secondary | ICD-10-CM | POA: Diagnosis present

## 2022-03-28 DIAGNOSIS — R601 Generalized edema: Secondary | ICD-10-CM | POA: Diagnosis present

## 2022-03-28 DIAGNOSIS — R5383 Other fatigue: Secondary | ICD-10-CM | POA: Diagnosis not present

## 2022-03-28 DIAGNOSIS — Z515 Encounter for palliative care: Secondary | ICD-10-CM | POA: Diagnosis not present

## 2022-03-28 DIAGNOSIS — I160 Hypertensive urgency: Secondary | ICD-10-CM | POA: Diagnosis present

## 2022-03-28 LAB — PREPARE RBC (CROSSMATCH)

## 2022-03-28 LAB — BLOOD CULTURE ID PANEL (REFLEXED) - BCID2

## 2022-03-28 LAB — RENAL FUNCTION PANEL
Albumin: 2 g/dL — ABNORMAL LOW (ref 3.5–5.0)
Anion gap: 5 (ref 5–15)
BUN: 58 mg/dL — ABNORMAL HIGH (ref 8–23)
CO2: 23 mmol/L (ref 22–32)
Calcium: 7 mg/dL — ABNORMAL LOW (ref 8.9–10.3)
Chloride: 118 mmol/L — ABNORMAL HIGH (ref 98–111)
Creatinine, Ser: 3.31 mg/dL — ABNORMAL HIGH (ref 0.44–1.00)
GFR, Estimated: 15 mL/min — ABNORMAL LOW (ref >60)
Glucose, Bld: 71 mg/dL (ref 70–99)
Phosphorus: 6.2 mg/dL — ABNORMAL HIGH (ref 2.5–4.6)
Potassium: 3.4 mmol/L — ABNORMAL LOW (ref 3.5–5.1)
Sodium: 146 mmol/L — ABNORMAL HIGH (ref 135–145)

## 2022-03-28 LAB — CBC
HCT: 21.1 % — ABNORMAL LOW (ref 36.0–46.0)
Hemoglobin: 6.5 g/dL — CL (ref 12.0–15.0)
MCH: 29.5 pg (ref 26.0–34.0)
MCHC: 30.8 g/dL (ref 30.0–36.0)
MCV: 95.9 fL (ref 80.0–100.0)
Platelets: 125 10*3/uL — ABNORMAL LOW (ref 150–400)
RBC: 2.2 MIL/uL — ABNORMAL LOW (ref 3.87–5.11)
RDW: 15.1 % (ref 11.5–15.5)
WBC: 3.1 10*3/uL — ABNORMAL LOW (ref 4.0–10.5)
nRBC: 0 % (ref 0.0–0.2)

## 2022-03-28 LAB — URINE CULTURE

## 2022-03-28 LAB — HEMOGLOBIN AND HEMATOCRIT, BLOOD
HCT: 24 % — ABNORMAL LOW (ref 36.0–46.0)
Hemoglobin: 7.7 g/dL — ABNORMAL LOW (ref 12.0–15.0)

## 2022-03-28 MED ORDER — ORAL CARE MOUTH RINSE: 15.0000 mL | OROMUCOSAL | Status: DC | PRN

## 2022-03-28 MED ORDER — SODIUM CHLORIDE 0.9% IV SOLUTION
Freq: Once | INTRAVENOUS | Status: AC
Start: 2022-03-28 — End: 2022-03-28

## 2022-03-28 MED ORDER — MUSCLE RUB 10-15 % EX CREA
TOPICAL_CREAM | CUTANEOUS | Status: DC | PRN
  Administered 2022-04-02: 1 via TOPICAL
  Filled 2022-03-28: qty 85

## 2022-03-28 NOTE — Care Management Obs Status (Signed)
[facility] NOTIFICATION   Patient Details  Name: Kara Mullins MRN: [id number] Date of Birth: 1951-04-16   Medicare Observation Status Notification Given:  Yes    MahabirJuliann Pulse, RN 03/28/2022, 1:59 PM

## 2022-03-28 NOTE — Hospital Course (Signed)
71 y.o. female with medical history significant of paroxysmal atrial fibrillation, prolonged QT interval, hypothermia, chronic diastolic heart failure with her most recent EF of 55 to 60%, stage V CKD not on dialysis, hyperlipidemia, hypertension, COVID-19, generalized anxiety disorder, pancytopenia, history of urinary retention with chronic indwelling Foley catheter who was brought to the emergency department by her daughter due to elevated blood pressure measuring 220/150 mmHg at home due to the patient not taking her blood pressure medications.  Her daughter, gave her a tablet of carvedilol and brought her to the emergency department.  She has been basically bedbound at home, except for getting out of bed to a chair with assistance.  Her lower extremity edema has worsened considerably.  She and her daughter denied fever, chills, rhinorrhea, sore throat, wheezing or hemoptysis.  No chest pain, palpitations, diaphoresis, PND, orthopnea.  No abdominal pain, nausea, emesis, diarrhea, constipation, melena or hematochezia.  No flank pain, dysuria, frequency or hematuria.  No polyuria, polydipsia, polyphagia or blurred vision.   Pt found to have worsening renal function with volume overload. Nephrology consulted

## 2022-03-28 NOTE — Progress Notes (Signed)
PHARMACY - PHYSICIAN COMMUNICATION CRITICAL VALUE ALERT - BLOOD CULTURE IDENTIFICATION (BCID)  Kara Mullins is an 71 y.o. female who presented to [facility] on 03/27/2022 with a chief complaint of hypertension.  Assessment:   Patient is bed bound at baseline with chronic foley who presented with hypertension, LE swelling. She was also started on empiric antibiotics for questionable hypothermia and pyuria.   1/2 blood cx now + GPC, BCID+ Staph species with no resistance detected.  This is likely contaminant.   Name of physician (or Provider) Contacted: Clarene Essex  Current antibiotics: Rocephin 1gm IV q24h  Changes to prescribed antibiotics recommended:  None for now; continue to monitor cx data  Results for orders placed or performed during the hospital encounter of 03/27/22  Blood Culture ID Panel (Reflexed) (Collected: 03/27/2022  3:14 AM)  Result Value Ref Range   Enterococcus faecalis NOT DETECTED NOT DETECTED   Enterococcus Faecium NOT DETECTED NOT DETECTED   Listeria monocytogenes NOT DETECTED NOT DETECTED   Staphylococcus species DETECTED (A) NOT DETECTED   Staphylococcus aureus (BCID) NOT DETECTED NOT DETECTED   Staphylococcus epidermidis NOT DETECTED NOT DETECTED   Staphylococcus lugdunensis NOT DETECTED NOT DETECTED   Streptococcus species NOT DETECTED NOT DETECTED   Streptococcus agalactiae NOT DETECTED NOT DETECTED   Streptococcus pneumoniae NOT DETECTED NOT DETECTED   Streptococcus pyogenes NOT DETECTED NOT DETECTED   A.calcoaceticus-baumannii NOT DETECTED NOT DETECTED   Bacteroides fragilis NOT DETECTED NOT DETECTED   Enterobacterales NOT DETECTED NOT DETECTED   Enterobacter cloacae complex NOT DETECTED NOT DETECTED   Escherichia coli NOT DETECTED NOT DETECTED   Klebsiella aerogenes NOT DETECTED NOT DETECTED   Klebsiella oxytoca NOT DETECTED NOT DETECTED   Klebsiella pneumoniae NOT DETECTED NOT DETECTED   Proteus species NOT DETECTED NOT DETECTED   Salmonella  species NOT DETECTED NOT DETECTED   Serratia marcescens NOT DETECTED NOT DETECTED   Haemophilus influenzae NOT DETECTED NOT DETECTED   Neisseria meningitidis NOT DETECTED NOT DETECTED   Pseudomonas aeruginosa NOT DETECTED NOT DETECTED   Stenotrophomonas maltophilia NOT DETECTED NOT DETECTED   Candida albicans NOT DETECTED NOT DETECTED   Candida auris NOT DETECTED NOT DETECTED   Candida glabrata NOT DETECTED NOT DETECTED   Candida krusei NOT DETECTED NOT DETECTED   Candida parapsilosis NOT DETECTED NOT DETECTED   Candida tropicalis NOT DETECTED NOT DETECTED   Cryptococcus neoformans/gattii NOT DETECTED NOT DETECTED    Netta Cedars PharmD 03/28/2022  1:07 AM

## 2022-03-28 NOTE — Progress Notes (Signed)
Progress Note   Patient: Kara Mullins EYC:[id number] DOB: 12-06-1950 DOA: 03/27/2022     0 DOS: the patient was seen and examined on 03/28/2022   Brief hospital course: 71 y.o. female with medical history significant of paroxysmal atrial fibrillation, prolonged QT interval, hypothermia, chronic diastolic heart failure with her most recent EF of 55 to 60%, stage V CKD not on dialysis, hyperlipidemia, hypertension, COVID-19, generalized anxiety disorder, pancytopenia, history of urinary retention with chronic indwelling Foley catheter who was brought to the emergency department by her daughter due to elevated blood pressure measuring 220/150 mmHg at home due to the patient not taking her blood pressure medications.  Her daughter, gave her a tablet of carvedilol and brought her to the emergency department.  She has been basically bedbound at home, except for getting out of bed to a chair with assistance.  Her lower extremity edema has worsened considerably.  She and her daughter denied fever, chills, rhinorrhea, sore throat, wheezing or hemoptysis.  No chest pain, palpitations, diaphoresis, PND, orthopnea.  No abdominal pain, nausea, emesis, diarrhea, constipation, melena or hematochezia.  No flank pain, dysuria, frequency or hematuria.  No polyuria, polydipsia, polyphagia or blurred vision.   Pt found to have worsening renal function with volume overload. Nephrology consulted  Assessment and Plan: No notes have been filed under this hospital service. Service: Hospitalist Principal Problem:   Severe protein-calorie malnutrition ([place]) Due to severe proteinuria and presenting as:   Anasarca associated with disorder of kidney In the setting of:   CKD (chronic kidney disease) stage 5, GFR less than 15 ml/min [facility]) -Nephrology consulted, recs to continue diuresis as tolerated -Continue furosemide 60 mg IVP daily. -Continued with low-dose losartan. -Per nephrology, consideration for changing lasix to  torsemide at d/c -Overall poor prognosis as pt is not candidate for HD -Palliative Care is consulted. Full Code confirmed at time of presentation -repeat bmet in AM   Active Problems:   Anemia of chronic kidney failure, stage 5 (HCC) Hgb down to 6.5, now s/p 1 unit PRBC transfusion Erythropoietin per nephrology as needed. Repeat cbc in AM     Leukocytopenia Monitor WBC.     Hypothermia Resolved. Questionable UTI. Will cover with ceftriaxone pending UC&S.     Essential hypertension Presenting with:   Hypertensive urgency Continue carvedilol 6.25 mg p.o. twice daily. Resume losartan 25 mg p.o. daily. Monitor BP, HR, renal function electrolytes.     Elevated troponin level not due myocardial infarction Secondary to demand ischemia.     Prolonged QT interval Check magnesium level. Avoid QT prolonging meds.     Paroxysmal atrial fibrillation (HCC) CHA?DS?-VASc Score of at least 5. (Age, female, HTN, diastolic CHF, aortic atherosclerosis). Not on anticoagulation. Continue beta-blocker for rate control.     Hyperlipidemia/aortic atherosclerosis. Has intolerance to statins.        Subjective: Continues with generalized swelling. Denies sob  Physical Exam: Vitals:   03/28/22 0910 03/28/22 1144 03/28/22 1145 03/28/22 1300  BP: (!) 160/84 (!) 176/84 (!) 176/84 (!) 145/90  Pulse: 69 73 72 72  Resp: 12 18  16   Temp: 97.7 F (36.5 C) 97.6 F (36.4 C) 97.6 F (36.4 C) (!) 97.5 F (36.4 C)  TempSrc: Oral Oral Oral Oral  SpO2: 100% 100% 100% 100%  Weight:      Height:       General exam: Awake, laying in bed, in nad Respiratory system: Normal respiratory effort, no wheezing Cardiovascular system: regular rate, s1, s2 Gastrointestinal system: Soft, nondistended,  positive BS Central nervous system: CN2-12 grossly intact, strength intact Extremities: Perfused, no clubbing, BLE edema pitting Skin: Normal skin turgor, no notable skin lesions seen, Psychiatry: Mood  normal // no visual hallucinations   Data Reviewed:  Labs reviewed: Cr 3.31, Na 146, K 3.4   Family Communication: Pt in room, family at bedside  Disposition: Status is: Observation The patient will require care spanning > 2 midnights and should be moved to inpatient because: Severity of illness  Planned Discharge Destination: Home     Author: Marylu Lund, MD 03/28/2022 5:25 PM  For on call review [email or web address].

## 2022-03-28 NOTE — Progress Notes (Signed)
[facility] Progress Note  Subjective: diuresing w/ 500 cc out yest and 1400 out today. Pt no new c/o.   Vitals:   03/28/22 0849 03/28/22 0910 03/28/22 1144 03/28/22 1300  BP: (!) 151/83 (!) 160/84 (!) 176/84 (!) 145/90  Pulse: 70 69 73 72  Resp:  12 18 16   Temp: 97.6 F (36.4 C) 97.7 F (36.5 C) 97.6 F (36.4 C) (!) 97.5 F (36.4 C)  TempSrc: Oral Oral Oral Oral  SpO2: 100% 100% 100% 100%  Weight:      Height:        Exam: Gen alert, no distress, elderly obese AAF lying in bed No rash, cyanosis or gangrene Sclera anicteric, throat clear  No jvd or bruits Chest occ rhonchi, no rales, no wheezing RRR no MRG Abd soft ntnd no mass or ascites +bs Ext diffuse bilat 3+ LE edema, also hips/ flanks/ breast pitting edema Neuro is alert, Ox3, gen'd weakness, debilitated    CXR - layering bilat effusions and bb atx vs infiltrate      Home meds include - coreg bid, lasix 40 qd, mvi, sod bicarb, zinc, losartan 25 qd, aldactone 12.5 qd      UA > 300 protein, 21-50 rbc, > 50 wbc, many bacteria   Assessment/ Plan: CKD IV - w/ anasarca, decompensated due to renal failure, hypoalbuminemia, possibly diast CHF contributing, probably has nephrotic syndrome as well.  Will get UP/C ratio. Creat 3.0 here, which is better than low 5's at last admission in March.  Needs diuresis. As per last admission in March 2023, pt is not candidate for immunosuppression or dialysis due to significant/ progressive debility. Started on IV lasix 60 mg tiw. Will consider changing home lasix to torsemide at dc. Prognosis overall is very poor and I have this w/ the patient and her daughter.  GOC - will consult palliative care to d/w patient her [place]. Currently full code, I encouraged them to consider DNR status.  HTN - cont ARB, coreg , diurese Chronic debility - basically bedbound  Pyuria - per pmd PAF - per pmd Anemia - chronic disease/ ckd, Hb 8.1. tranfuse prn.    Kara Mullins 03/28/2022, 3:44  PM   Recent Labs  Lab 03/27/22 0214 03/28/22 0526 03/28/22 1443  HGB 8.1* 6.5* 7.7*  ALBUMIN <1.5* 2.0*  --   CALCIUM 7.1* 7.0*  --   PHOS  --  6.2*  --   CREATININE 3.06* 3.31*  --   K 3.6 3.4*  --    Inpatient medications:  carvedilol  6.25 mg Oral BID   furosemide  60 mg Intravenous Q8H   losartan  25 mg Oral Daily   sodium bicarbonate  650 mg Oral BID    cefTRIAXone (ROCEPHIN)  IV 1 g (03/28/22 1224)   acetaminophen **OR** acetaminophen, Muscle Rub, mouth rinse

## 2022-03-28 NOTE — TOC Initial Note (Signed)
Transition of Care [facility]) - Initial/Assessment Note    Patient Details  Name: Kara Mullins MRN: [id number] Date of Birth: 28-May-1951  Transition of Care [facility]) CM/SW Contact:    Dessa Phi, RN Phone Number: 03/28/2022, 2:02 PM  Clinical Narrative:  Active w/[facility] HHRN/PT to be resumed. PTAR @ d/c.                 Expected Discharge Plan: [place] Barriers to Discharge: Continued Medical Work up   Patient Goals and CMS Choice Patient states their goals for this hospitalization and ongoing recovery are:: Home CMS Medicare.gov Compare Post Acute Care list provided to:: Patient Choice offered to / list presented to : Patient  Expected Discharge Plan and Services Expected Discharge Plan: [place]   Discharge Planning Services: CM Consult Post Acute Care Choice: [place] arrangements for the past 2 months: Single Family Home                                      Prior Living Arrangements/Services Living arrangements for the past 2 months: Single Family Home Lives with:: Adult Children Patient language and need for interpreter reviewed:: Yes Do you feel safe going back to the place where you live?: Yes        Care giver support system in place?: Yes (comment) Current home services: DME (rw,chronic f/c;[facility] HHRN/PT) Criminal Activity/Legal Involvement Pertinent to Current Situation/Hospitalization: No - Comment as needed  Activities of Daily Living Home Assistive Devices/Equipment: Walker (specify type) ADL Screening (condition at time of admission) Patient's cognitive ability adequate to safely complete daily activities?: Yes Is the patient deaf or have difficulty hearing?: No Does the patient have difficulty seeing, even when wearing glasses/contacts?: No Does the patient have difficulty concentrating, remembering, or making decisions?: No Patient able to express need for assistance with ADLs?: Yes Does  the patient have difficulty dressing or bathing?: Yes Independently performs ADLs?: No Communication: Independent Dressing (OT): Needs assistance Is this a change from baseline?: Pre-admission baseline Grooming: Independent Feeding: Independent Bathing: Needs assistance Is this a change from baseline?: Pre-admission baseline Toileting: Needs assistance Is this a change from baseline?: Pre-admission baseline In/Out Bed: Needs assistance Is this a change from baseline?: Pre-admission baseline Walks in Home: Needs assistance Is this a change from baseline?: Pre-admission baseline Does the patient have difficulty walking or climbing stairs?: Yes Weakness of Legs: Both Weakness of Arms/Hands: None  Permission Sought/Granted Permission sought to share information with : Case Manager Permission granted to share information with : Yes, Verbal Permission Granted  Share Information with NAME: Case manager           Emotional Assessment Appearance:: Appears stated age Attitude/Demeanor/Rapport: Gracious Affect (typically observed): Accepting Orientation: : Oriented to Self, Oriented to Place, Oriented to  Time, Oriented to Situation Alcohol / Substance Use: Not Applicable Psych Involvement: No (comment)  Admission diagnosis:  Anasarca [R60.1] Anasarca associated with disorder of kidney [N04.9] Patient Active Problem List   Diagnosis Date Noted   Anasarca associated with disorder of kidney 03/27/2022   Leukocytopenia 03/27/2022   Aortic atherosclerosis ([place]) 03/27/2022   Pressure injury of skin 01/02/2022   Hypotension, unspecified 12/30/2021   Pneumonia due to COVID-19 virus 12/26/2021   Palliative care by specialist    Goals of care, counseling/discussion    General weakness    Sepsis ([place]) 12/22/2021   COVID-19 virus  infection 12/22/2021   Paroxysmal atrial fibrillation (HCC) 12/22/2021   CKD (chronic kidney disease) stage 5, GFR less than 15 ml/min (HCC) 12/22/2021    Anemia of chronic kidney failure, stage 5 (HCC) 12/22/2021   Prolonged QT interval 12/22/2021   Debility 35/32/9924   Metabolic acidosis 26/83/4196   Severe sepsis ([place]) 11/19/2021   Hypothermia 11/09/2021   Hyperlipidemia 11/09/2021   Chronic combined systolic and diastolic congestive heart failure ([place]) 11/09/2021   Pancytopenia ([place]) 11/09/2021   Severe protein-calorie malnutrition ([place]) 11/09/2021   Acute combined systolic and diastolic HF (heart failure) ([place])    CHF exacerbation ([place]) 03/06/2021   Essential hypertension    Hypertensive urgency 03/05/2021   Acute CHF (congestive heart failure) ([place]) 03/05/2021   Nausea and vomiting 03/05/2021   Elevated troponin level not due myocardial infarction 03/05/2021   Elevated d-dimer 03/05/2021   Generalized anxiety disorder 03/05/2021   Acute congestive heart failure ([place]) 03/05/2021   PCP:  [name], NP Pharmacy:   [facility] [place], [address] [place] [address] Phone: [phone] Fax: [phone]     Social Determinants of Health (SDOH) Interventions    Readmission Risk Interventions    12/23/2021   11:01 AM  Readmission Risk Prevention Plan  Transportation Screening Complete  PCP or Specialist Appt within 3-5 Days Complete  HRI or [place] Complete  Social Work Consult for [place] Planning/Counseling Complete  Palliative Care Screening Not Applicable  Medication Review Press photographer) Complete

## 2022-03-29 DIAGNOSIS — Z7189 Other specified counseling: Secondary | ICD-10-CM

## 2022-03-29 DIAGNOSIS — Z515 Encounter for palliative care: Secondary | ICD-10-CM

## 2022-03-29 DIAGNOSIS — E43 Unspecified severe protein-calorie malnutrition: Secondary | ICD-10-CM | POA: Diagnosis not present

## 2022-03-29 DIAGNOSIS — D631 Anemia in chronic kidney disease: Secondary | ICD-10-CM | POA: Diagnosis not present

## 2022-03-29 DIAGNOSIS — N185 Chronic kidney disease, stage 5: Secondary | ICD-10-CM | POA: Diagnosis not present

## 2022-03-29 DIAGNOSIS — N049 Nephrotic syndrome with unspecified morphologic changes: Secondary | ICD-10-CM | POA: Diagnosis not present

## 2022-03-29 LAB — BPAM RBC
Blood Product Expiration Date: 202307052359
ISSUE DATE / TIME: 202306150846
Unit Type and Rh: 6200

## 2022-03-29 LAB — CBC
HCT: 24.2 % — ABNORMAL LOW (ref 36.0–46.0)
Hemoglobin: 7.7 g/dL — ABNORMAL LOW (ref 12.0–15.0)
MCH: 30.6 pg (ref 26.0–34.0)
MCHC: 31.8 g/dL (ref 30.0–36.0)
MCV: 96 fL (ref 80.0–100.0)
Platelets: 133 10*3/uL — ABNORMAL LOW (ref 150–400)
RBC: 2.52 MIL/uL — ABNORMAL LOW (ref 3.87–5.11)
RDW: 15.1 % (ref 11.5–15.5)
WBC: 3.7 10*3/uL — ABNORMAL LOW (ref 4.0–10.5)
nRBC: 0 % (ref 0.0–0.2)

## 2022-03-29 LAB — COMPREHENSIVE METABOLIC PANEL
ALT: 16 U/L (ref 0–44)
AST: 27 U/L (ref 15–41)
Albumin: 1.8 g/dL — ABNORMAL LOW (ref 3.5–5.0)
Alkaline Phosphatase: 117 U/L (ref 38–126)
Anion gap: 9 (ref 5–15)
BUN: 56 mg/dL — ABNORMAL HIGH (ref 8–23)
CO2: 23 mmol/L (ref 22–32)
Calcium: 7.1 mg/dL — ABNORMAL LOW (ref 8.9–10.3)
Chloride: 115 mmol/L — ABNORMAL HIGH (ref 98–111)
Creatinine, Ser: 3.24 mg/dL — ABNORMAL HIGH (ref 0.44–1.00)
GFR, Estimated: 15 mL/min — ABNORMAL LOW (ref >60)
Glucose, Bld: 59 mg/dL — ABNORMAL LOW (ref 70–99)
Potassium: 3.5 mmol/L (ref 3.5–5.1)
Sodium: 147 mmol/L — ABNORMAL HIGH (ref 135–145)
Total Bilirubin: 0.4 mg/dL (ref 0.3–1.2)
Total Protein: 5.1 g/dL — ABNORMAL LOW (ref 6.5–8.1)

## 2022-03-29 LAB — TYPE AND SCREEN
ABO/RH(D): AB POS
Antibody Screen: NEGATIVE
Unit division: 0

## 2022-03-29 LAB — PHOSPHORUS: Phosphorus: 6.2 mg/dL — ABNORMAL HIGH (ref 2.5–4.6)

## 2022-03-29 MED ORDER — FUROSEMIDE 10 MG/ML IJ SOLN
80.0000 mg | Freq: Three times a day (TID) | INTRAMUSCULAR | Status: DC
  Administered 2022-03-29 – 2022-03-30 (×3): 80 mg via INTRAVENOUS
  Filled 2022-03-29 (×3): qty 8

## 2022-03-29 NOTE — Consult Note (Signed)
[facility] Inpatient Consult  03/29/2022  Kara Mullins [birth year] [id number]  [place] [facility] CM)   Patient chart has been reviewed with noted high risk score for unplanned readmissions.  Patient assessed for community [place] Management follow up needs.   Plan: Will continue to follow for progression and disposition plans.   Of note, [facility] Care Management services does not replace or interfere with any services that are arranged by inpatient case management or social work.    Netta Cedars, MSN, RN [facility] Liaison Toll free office [phone]

## 2022-03-29 NOTE — Consult Note (Signed)
Palliative Care Consult Note                                  Date: 03/29/2022   Patient Name: Kara Mullins  DOB: 1951/03/10  MRN: [id number]  Age / Sex: 71 y.o., female  PCP: Kara Mullins, Kara Bucks, NP Referring Physician: Donne Hazel, MD  Reason for Consultation: Establishing goals of care  HPI/Patient Profile: 71 y.o. female  with past medical history of paroxysmal atrial fibrillation not on anticoagulation, chronic diastolic heart failure, CKD stage 5, generalized anxiety disorder, prolonged QT interval, hypertension, hyperlipidemia, and history of urinary retention with chronic indwelling Foley catheter. She presented to [facility] emergency department on 03/27/2022 with hypertensive urgency. Blood pressure was measuring 220/150 at home due to patient was not taking her medications. Patient was found to have worsening renal function, worsening lower extremity edema, and volume overload. Admitted to hospitalist service.   Patient is active with [facility] Palliative services. Please see Dr. Cyndi Mullins note from 02/04/22. Next visit scheduled for 04/04/22.   Per nephrology, patient is not a candidate for dialysis due to severe progressive debility.    Past Medical History:  Diagnosis Date   Chronic combined systolic and diastolic congestive heart failure ([place]) 11/09/2021   Generalized anxiety disorder    Hyperlipidemia 11/09/2021   Hypertension    Stage 3b chronic kidney disease (CKD) ([place]) 11/09/2021    Subjective:   I have reviewed medical records including EPIC notes, labs and imaging, received report from the team, and assessed the patient at bedside.   I met at bedside with Kara Mullins and her daughter [place] to discuss diagnosis, prognosis, GOC, EOL wishes, disposition, and options.  Patient is known to PMT from her hospitalization in February 2023. I re-introduced Palliative Medicine as specialized medical care for people living with  serious illness. It focuses on providing relief from the symptoms and stress of a serious illness.   Created space and opportunity for patient and family to explore thoughts and feelings regarding current medical situation. Values and goals of care important to patient and family were attempted to be elicited.  Questions and concerns addressed. Patient/family encouraged to call with questions or concerns.    Life Review: Kara Mull[place] has lived in the [place] area her entire life. She worked in [employer] for many years ([employer] for 39+ years). She is divorced. Kara Mullins is her only living child. Her other daughter passed away in 16-Nov-2018. She has 2 granddaughters, ages 59 and 69.   Functional Status: Kara Mullins lives with Kara Mull[place] and her husband at their home in [place]. She is able to stand with assistance, but not for very long. She needs assistance with all ADLs, with the exception that she can feed herself.   Patient/Family Understanding of Illness: Patient and daughter understand that she has heart failure and advanced CKD. Education provided that these are non-curable and progressive illnesses. Patient appears to be struggling to accept this.   Advanced Directives: A MOST form was completed in April 2023. I reviewed this with Kara Mullins and her daughter. She does not wish to make changes at this time.   Cardiopulmonary Resuscitation: Attempt Resuscitation (CPR)  Medical Interventions: Comfort Measures: Keep clean, warm, and dry. Use medication by any route, positioning, wound care, and other measures to relieve pain and suffering. Use oxygen, suction and manual treatment of airway obstruction as needed for comfort. Do not  transfer to the hospital unless comfort needs cannot be met in current location.  Antibiotics: Determine use of limitation of antibiotics when infection occurs  IV Fluids: IV fluids for a defined trial period  Feeding Tube: No feeding tube     Discussion: We discussed patient's current illness and what it means in the larger context of her ongoing co-morbidities. Discussed that Kara Mullins has multiple medical problems including heart failure and chronic kidney disease.  Provided education on the natural trajectory of chronic illness, emphasizing that it is non-curable and progressive. Discussed that chronic illness results in decreased functional status over time, as patients do not usually return to previous baseline after having an illness or exacerbation.   The difference between aggressive medical intervention and comfort care was considered. Kara Mullins tells me that she wants to do what is necessary "to live". She states "I don't care what y'all say, I know that it's up to God".   At this time, Kara Mullins is not willing to discuss her overall prognosis and does not seem to be accepting she has advanced illness. Discussed the importance of continued conversation with patient, daughter, and her medical providers regarding overall plan of care and treatment options, ensuring decisions are within the context of the patients values and GOCs.     Review of Systems  Neurological:  Positive for weakness.    Objective:   Primary Diagnoses: Present on Admission:  Hypertensive urgency  Elevated troponin level not due myocardial infarction  Hypothermia  Anemia of chronic kidney failure, stage 5 (HCC)  CKD (chronic kidney disease) stage 5, GFR less than 15 ml/min (HCC)  Essential hypertension  Hyperlipidemia  Severe protein-calorie malnutrition (HCC)  Paroxysmal atrial fibrillation (HCC)  Prolonged QT interval  Leukocytopenia  Aortic atherosclerosis ([place])  ARF (acute renal failure) ([place])   Physical Exam Vitals reviewed.  Constitutional:      General: She is not in acute distress.    Comments: Chronically ill-appearing  Pulmonary:     Effort: Pulmonary effort is normal.  Musculoskeletal:     Right lower leg: Edema  present.     Left lower leg: Edema present.  Neurological:     Mental Status: She is alert.     Motor: Weakness present.     Vital Signs:  BP (!) 159/89 (BP Location: Left Arm)   Pulse 69   Temp 98 F (36.7 C)   Resp 18   Ht 5' 5" (1.651 m)   Wt 109.8 kg   SpO2 99%   BMI 40.28 kg/m   Palliative Assessment/Data: PPS 30-40%     Assessment & Plan:   SUMMARY OF RECOMMENDATIONS   Continue current interventions Goal  is to return home Patient is eligible for hospice, but is not mentally ready Continue outpatient palliative at discharge  Code Status/Advance Care Planning: Full as previously documented  Primary Decision Maker: Patient with support from her daughter  Prognosis:  Unable to determine  Discharge Planning:  Home with Palliative Services    Thank you for allowing [place] to participate in the care of [name]  MDM - [place]  Signed by: Elie Confer, NP Palliative Medicine Team  Team Phone # [phone]  For individual providers, please see AMION

## 2022-03-29 NOTE — Progress Notes (Signed)
Progress Note   Patient: Kara Mullins YCX:[id number] DOB: 03-Jun-1951 DOA: 03/27/2022     1 DOS: the patient was seen and examined on 03/29/2022   Brief hospital course: 71 y.o. female with medical history significant of paroxysmal atrial fibrillation, prolonged QT interval, hypothermia, chronic diastolic heart failure with her most recent EF of 55 to 60%, stage V CKD not on dialysis, hyperlipidemia, hypertension, COVID-19, generalized anxiety disorder, pancytopenia, history of urinary retention with chronic indwelling Foley catheter who was brought to the emergency department by her daughter due to elevated blood pressure measuring 220/150 mmHg at home due to the patient not taking her blood pressure medications.  Her daughter, gave her a tablet of carvedilol and brought her to the emergency department.  She has been basically bedbound at home, except for getting out of bed to a chair with assistance.  Her lower extremity edema has worsened considerably.  She and her daughter denied fever, chills, rhinorrhea, sore throat, wheezing or hemoptysis.  No chest pain, palpitations, diaphoresis, PND, orthopnea.  No abdominal pain, nausea, emesis, diarrhea, constipation, melena or hematochezia.  No flank pain, dysuria, frequency or hematuria.  No polyuria, polydipsia, polyphagia or blurred vision.   Pt found to have worsening renal function with volume overload. Nephrology consulted  Assessment and Plan: No notes have been filed under this hospital service. Service: Hospitalist Principal Problem:   Severe protein-calorie malnutrition ([place]) Due to severe proteinuria and presenting as:   Anasarca associated with disorder of kidney In the setting of:   CKD (chronic kidney disease) stage 5, GFR less than 15 ml/min [facility]) -Nephrology consulted, recs to continue diuresis as tolerated -Continue BID IV lasix, dose increased to 80mg  per Nephrology. -Continued with low-dose losartan. -Per nephrology,  consideration for changing lasix to torsemide at d/c -Overall poor prognosis as pt is not candidate for HD -Palliative Care is consulted. Full Code confirmed at time of presentation -repeat bmet in AM   Active Problems:   Anemia of chronic kidney failure, stage 5 (HCC) Hgb down to 6.5, now s/p 1 unit PRBC transfusion Erythropoietin per nephrology as needed. Hgb stable this AM Repeat cbc in AM     Leukocytopenia Monitor WBC.     Hypothermia Resolved. Questionable UTI. On empiric ceftriaxone     Essential hypertension Presenting with:   Hypertensive urgency Continue carvedilol 6.25 mg p.o. twice daily. Resume losartan 25 mg p.o. daily. BP stable at this time     Elevated troponin level not due myocardial infarction Secondary to demand ischemia.     Prolonged QT interval Check magnesium level. Avoid QT prolonging meds.     Paroxysmal atrial fibrillation (HCC) CHA?DS?-VASc Score of at least 5. (Age, female, HTN, diastolic CHF, aortic atherosclerosis). Not on anticoagulation. Continue beta-blocker for rate control.     Hyperlipidemia/aortic atherosclerosis. Has intolerance to statins.        Subjective: States voiding well. Denies sob  Physical Exam: Vitals:   03/29/22 0405 03/29/22 0608 03/29/22 1332 03/29/22 1347  BP: (!) 159/89  (!) 162/84 (!) 143/54  Pulse: 69  66 86  Resp: 18  14 20   Temp: 98 F (36.7 C)  97.6 F (36.4 C) 98.2 F (36.8 C)  TempSrc:   Oral Oral  SpO2: 99%  100% 98%  Weight:  109.8 kg    Height:       General exam: Conversant, in no acute distress Respiratory system: normal chest rise, clear, no audible wheezing Cardiovascular system: regular rhythm, s1-s2 Gastrointestinal system: Nondistended, nontender, pos  BS Central nervous system: No seizures, no tremors Extremities: No cyanosis, no joint deformities Skin: No rashes, no pallor Psychiatry: Affect normal // no auditory hallucinations   Data Reviewed:  Labs reviewed: 147, K  3.5, Cr 3.24   Family Communication: Pt in room, family at bedside  Disposition: Status is: Inpatient Continue inpatient stay because: Severity of illness  Planned Discharge Destination: Home     Author: Marylu Lund, MD 03/29/2022 3:33 PM  For on call review [email or web address].

## 2022-03-29 NOTE — Progress Notes (Signed)
[facility] Progress Note  Subjective: 1500 cc UOP yest and 270 today.   Vitals:   03/28/22 1300 03/28/22 2119 03/29/22 0405 03/29/22 0608  BP: (!) 145/90 (!) 158/76 (!) 159/89   Pulse: 72 67 69   Resp: 16 18 18    Temp: (!) 97.5 F (36.4 C) 97.7 F (36.5 C) 98 F (36.7 C)   TempSrc: Oral Oral    SpO2: 100% 100% 99%   Weight:    109.8 kg  Height:        Exam: Gen alert, no distress, elderly obese AAF lying in bed No rash, cyanosis or gangrene Sclera anicteric, throat clear  No jvd or bruits Chest occ rhonchi, no rales, no wheezing RRR no MRG Abd soft ntnd no mass or ascites +bs Ext diffuse bilat 3+ LE edema, also hips/ flanks/ breast pitting edema Neuro is alert, Ox3, gen'd weakness, debilitated    CXR - layering bilat effusions and bb atx vs infiltrate      Home meds include - coreg bid, lasix 40 qd, mvi, sod bicarb, zinc, losartan 25 qd, aldactone 12.5 qd      UA > 300 protein, 21-50 rbc, > 50 wbc, many bacteria    UP/C ration - 18.8 (approx 18mg  proteinuria per 24hrs)   Assessment/ Plan: CKD IV - w/ anasarca, decompensated due to renal failure, hypoalbuminemia, diast CHF  and severe nephrotic syndrome (18 gm proteinuria/d).  Creat 3.0- 3.5 here which is better than the last creat of 5 in march 2023. Admitted for worsening LE edema and is getting diuresed w/ IV lasix. Labs/ creat stable. Na+ up to 147.  Pt is not felt to be a candidate for immunosuppression/ dialysis due to severe progressive debility. Will consider changing home lasix to torsemide at dc.  May need CKA f/u after dc.  Cont IV lasix for now, ^ to 80mg  tid.   [place] - palliative care consulted for Zinc. Currently full code.  HTN - cont ARB, coreg , diurese Chronic debility - basically bedbound  Pyuria - per pmd PAF - per pmd Anemia - chronic disease/ ckd, Hb 8.1. tranfuse prn.    Rob Nicholette Dolson 03/29/2022, 1:08 PM   Recent Labs  Lab 03/28/22 0526 03/28/22 1443 03/29/22 0430  HGB 6.5*  7.7* 7.7*  ALBUMIN 2.0*  --  1.8*  CALCIUM 7.0*  --  7.1*  PHOS 6.2*  --  6.2*  CREATININE 3.31*  --  3.24*  K 3.4*  --  3.5    Inpatient medications:  carvedilol  6.25 mg Oral BID   furosemide  60 mg Intravenous Q8H   losartan  25 mg Oral Daily   sodium bicarbonate  650 mg Oral BID    cefTRIAXone (ROCEPHIN)  IV 1 g (03/29/22 1236)   acetaminophen **OR** acetaminophen, Muscle Rub, mouth rinse

## 2022-03-30 DIAGNOSIS — I700 Atherosclerosis of aorta: Secondary | ICD-10-CM | POA: Diagnosis not present

## 2022-03-30 DIAGNOSIS — N049 Nephrotic syndrome with unspecified morphologic changes: Secondary | ICD-10-CM | POA: Diagnosis not present

## 2022-03-30 LAB — CULTURE, BLOOD (ROUTINE X 2)
Special Requests: ADEQUATE
Special Requests: ADEQUATE

## 2022-03-30 LAB — RENAL FUNCTION PANEL
Albumin: 1.6 g/dL — ABNORMAL LOW (ref 3.5–5.0)
Anion gap: 4 — ABNORMAL LOW (ref 5–15)
BUN: 55 mg/dL — ABNORMAL HIGH (ref 8–23)
CO2: 24 mmol/L (ref 22–32)
Calcium: 7.1 mg/dL — ABNORMAL LOW (ref 8.9–10.3)
Chloride: 117 mmol/L — ABNORMAL HIGH (ref 98–111)
Creatinine, Ser: 3.22 mg/dL — ABNORMAL HIGH (ref 0.44–1.00)
GFR, Estimated: 15 mL/min — ABNORMAL LOW (ref >60)
Glucose, Bld: 73 mg/dL (ref 70–99)
Phosphorus: 6.1 mg/dL — ABNORMAL HIGH (ref 2.5–4.6)
Potassium: 3.3 mmol/L — ABNORMAL LOW (ref 3.5–5.1)
Sodium: 145 mmol/L (ref 135–145)

## 2022-03-30 MED ORDER — ALBUMIN HUMAN 25 % IV SOLN
25.0000 g | Freq: Once | INTRAVENOUS | Status: AC
  Administered 2022-03-30: 25 g via INTRAVENOUS
  Filled 2022-03-30: qty 100

## 2022-03-30 MED ORDER — DEXTROSE 5 % IV SOLN
100.0000 mg | Freq: Three times a day (TID) | INTRAVENOUS | Status: DC
Start: 2022-03-30 — End: 2022-04-01
  Administered 2022-03-30 – 2022-04-01 (×5): 100 mg via INTRAVENOUS
  Filled 2022-03-30 (×8): qty 10

## 2022-03-30 MED ORDER — METOLAZONE 5 MG PO TABS
5.0000 mg | ORAL_TABLET | Freq: Every day | ORAL | Status: DC
  Administered 2022-03-30 – 2022-04-01 (×3): 5 mg via ORAL
  Filled 2022-03-30 (×3): qty 1

## 2022-03-30 NOTE — Plan of Care (Signed)
  Problem: Education: Goal: Ability to demonstrate management of disease process will improve Outcome: Progressing Goal: Ability to verbalize understanding of medication therapies will improve Outcome: Progressing   Problem: Clinical Measurements: Goal: Will remain free from infection Outcome: Progressing Goal: Cardiovascular complication will be avoided Outcome: Progressing   Problem: Activity: Goal: Capacity to carry out activities will improve Outcome: Not Progressing

## 2022-03-30 NOTE — Progress Notes (Signed)
[facility] Progress Note  Subjective: 1500 cc UOP yest and 115 today.   Vitals:   03/29/22 2006 03/30/22 0341 03/30/22 0535 03/30/22 1300  BP: (!) 175/95  (!) 155/88 (!) 157/96  Pulse: 67  65 63  Resp: 18  18   Temp: 97.6 F (36.4 C)  97.6 F (36.4 C) (!) 97.5 F (36.4 C)  TempSrc: Oral  Oral Oral  SpO2: 100%  99% 100%  Weight:  114.1 kg    Height:        Exam: Gen alert, no distress, elderly obese AAF lying in bed No rash, cyanosis or gangrene Sclera anicteric, throat clear  No jvd or bruits Chest occ rhonchi, no rales, no wheezing RRR no MRG Abd soft ntnd no mass or ascites +bs Ext diffuse bilat 3+ LE edema, also hips/ flanks/ breast pitting edema Neuro is alert, Ox3, gen'd weakness, debilitated    CXR - layering bilat effusions and bb atx vs infiltrate      Home meds include - coreg bid, lasix 40 qd, mvi, sod bicarb, zinc, losartan 25 qd, aldactone 12.5 qd      UA > 300 protein, 21-50 rbc, > 50 wbc, many bacteria    UP/C ration - 18.8 (approx 18mg  proteinuria per 24hrs)   Assessment/ Plan: CKD IV - w/ anasarca, decompensated due to renal failure, hypoalbuminemia, diast CHF  and severe nephrotic syndrome (18 gm proteinuria/d).  Creat 3.0- 3.5 here which is better than the last creat of 5 in march 2023. Admitted for worsening LE edema and is getting diuresed w/ IV lasix. Will ^ to 100 tid and add zaroxolyn 5 mg qd. Will follow.  Prognosis/ GOC - pt not felt to be a candidate for immunosuppression/ dialysis due to severe debility. Renal function better now than in march (creat 5). If her debility improves she could be considered for dialysis. Will need CKA f/u after dc.  HTN - cont ARB, coreg. BP's med to high.  Chronic debility - basically bedbound  Pyuria - per pmd PAF - per pmd Anemia - chronic disease/ ckd, Hb 8.1. tranfuse prn.    Rob Shyleigh Daughtry 03/30/2022, 3:52 PM   Recent Labs  Lab 03/28/22 1443 03/29/22 0430 03/30/22 0507  HGB 7.7* 7.7*   --   ALBUMIN  --  1.8* 1.6*  CALCIUM  --  7.1* 7.1*  PHOS  --  6.2* 6.1*  CREATININE  --  3.24* 3.22*  K  --  3.5 3.3*    Inpatient medications:  carvedilol  6.25 mg Oral BID   furosemide  80 mg Intravenous Q8H   losartan  25 mg Oral Daily   sodium bicarbonate  650 mg Oral BID     acetaminophen **OR** acetaminophen, Muscle Rub, mouth rinse

## 2022-03-30 NOTE — Progress Notes (Signed)
Progress Note   Patient: Kara Mullins YOV:[id number] DOB: 1950-10-27 DOA: 03/27/2022     2 DOS: the patient was seen and examined on 03/30/2022   Brief hospital course: 71 y.o. female with medical history significant of paroxysmal atrial fibrillation, prolonged QT interval, hypothermia, chronic diastolic heart failure with her most recent EF of 55 to 60%, stage V CKD not on dialysis, hyperlipidemia, hypertension, COVID-19, generalized anxiety disorder, pancytopenia, history of urinary retention with chronic indwelling Foley catheter who was brought to the emergency department by her daughter due to elevated blood pressure measuring 220/150 mmHg at home due to the patient not taking her blood pressure medications.  Her daughter, gave her a tablet of carvedilol and brought her to the emergency department.  She has been basically bedbound at home, except for getting out of bed to a chair with assistance.  Her lower extremity edema has worsened considerably.  She and her daughter denied fever, chills, rhinorrhea, sore throat, wheezing or hemoptysis.  No chest pain, palpitations, diaphoresis, PND, orthopnea.  No abdominal pain, nausea, emesis, diarrhea, constipation, melena or hematochezia.  No flank pain, dysuria, frequency or hematuria.  No polyuria, polydipsia, polyphagia or blurred vision.   Pt found to have worsening renal function with volume overload. Nephrology consulted  Assessment and Plan: No notes have been filed under this hospital service. Service: Hospitalist Principal Problem:   Severe protein-calorie malnutrition ([place]) Due to severe proteinuria and presenting as:   Anasarca associated with disorder of kidney In the setting of:   CKD (chronic kidney disease) stage 5, GFR less than 15 ml/min [facility]) -Nephrology consulted, recs to continue diuresis as tolerated -Continue BID IV lasix, dose increased to 80mg  per Nephrology. -Continued with low-dose losartan. -Per nephrology,  consideration for changing lasix to torsemide at d/c -Pt with hypoalbuminemia. Will give trial of IV albumin to assist diuresis -Overall poor prognosis as pt is not candidate for HD -Palliative Care is consulted. Full Code confirmed at time of presentation -recheck bmet in AM   Active Problems:   Anemia of chronic kidney failure, stage 5 (HCC) Hgb earlier down to 6.5, now s/p 1 unit PRBC transfusion with stable hgb subsequently  Repeat cbc in AM     Leukocytopenia Monitor WBC.     Hypothermia Resolved. Completed course of rocephin for UTI     Essential hypertension Presenting with:   Hypertensive urgency Continue carvedilol 6.25 mg p.o. twice daily. Resume losartan 25 mg p.o. daily. BP stable at this time     Elevated troponin level not due myocardial infarction Secondary to demand ischemia.     Prolonged QT interval Check magnesium level. Avoid QT prolonging meds.     Paroxysmal atrial fibrillation (HCC) CHA?DS?-VASc Score of at least 5. (Age, female, HTN, diastolic CHF, aortic atherosclerosis). Not on anticoagulation. Continue beta-blocker for rate control.     Hyperlipidemia/aortic atherosclerosis. Has intolerance to statins.        Subjective: Reports feeling better. Voiding well. Denies sob  Physical Exam: Vitals:   03/29/22 2006 03/30/22 0341 03/30/22 0535 03/30/22 1300  BP: (!) 175/95  (!) 155/88 (!) 157/96  Pulse: 67  65 63  Resp: 18  18   Temp: 97.6 F (36.4 C)  97.6 F (36.4 C) (!) 97.5 F (36.4 C)  TempSrc: Oral  Oral Oral  SpO2: 100%  99% 100%  Weight:  114.1 kg    Height:       General exam: Awake, laying in bed, in nad Respiratory system: Normal respiratory effort,  no wheezing Cardiovascular system: regular rate, s1, s2 Gastrointestinal system: Soft, nondistended, positive BS Central nervous system: CN2-12 grossly intact, strength intact Extremities: Perfused, no clubbing Skin: Normal skin turgor, no notable skin lesions  seen Psychiatry: Mood normal // no visual hallucinations   Data Reviewed:  Labs reviewed: 145, K 3.3, Cr 3.22   Family Communication: Pt in room, family at bedside  Disposition: Status is: Inpatient Continue inpatient stay because: Severity of illness  Planned Discharge Destination: Home     Author: Marylu Lund, MD 03/30/2022 2:46 PM  For on call review [email or web address].

## 2022-03-31 DIAGNOSIS — I700 Atherosclerosis of aorta: Secondary | ICD-10-CM | POA: Diagnosis not present

## 2022-03-31 DIAGNOSIS — D631 Anemia in chronic kidney disease: Secondary | ICD-10-CM | POA: Diagnosis not present

## 2022-03-31 DIAGNOSIS — N049 Nephrotic syndrome with unspecified morphologic changes: Secondary | ICD-10-CM | POA: Diagnosis not present

## 2022-03-31 DIAGNOSIS — N185 Chronic kidney disease, stage 5: Secondary | ICD-10-CM | POA: Diagnosis not present

## 2022-03-31 LAB — RENAL FUNCTION PANEL
Albumin: 1.7 g/dL — ABNORMAL LOW (ref 3.5–5.0)
Anion gap: 8 (ref 5–15)
BUN: 58 mg/dL — ABNORMAL HIGH (ref 8–23)
CO2: 23 mmol/L (ref 22–32)
Calcium: 7.2 mg/dL — ABNORMAL LOW (ref 8.9–10.3)
Chloride: 112 mmol/L — ABNORMAL HIGH (ref 98–111)
Creatinine, Ser: 3.34 mg/dL — ABNORMAL HIGH (ref 0.44–1.00)
GFR, Estimated: 14 mL/min — ABNORMAL LOW (ref >60)
Glucose, Bld: 87 mg/dL (ref 70–99)
Phosphorus: 6.3 mg/dL — ABNORMAL HIGH (ref 2.5–4.6)
Potassium: 3 mmol/L — ABNORMAL LOW (ref 3.5–5.1)
Sodium: 143 mmol/L (ref 135–145)

## 2022-03-31 MED ORDER — POTASSIUM CHLORIDE CRYS ER 20 MEQ PO TBCR
60.0000 meq | EXTENDED_RELEASE_TABLET | ORAL | Status: AC
  Administered 2022-03-31 (×2): 60 meq via ORAL
  Filled 2022-03-31 (×2): qty 3

## 2022-03-31 NOTE — Plan of Care (Signed)
  Problem: Education: Goal: Ability to demonstrate management of disease process will improve Outcome: Progressing   Problem: Activity: Goal: Capacity to carry out activities will improve Outcome: Progressing   Problem: Cardiac: Goal: Ability to achieve and maintain adequate cardiopulmonary perfusion will improve Outcome: Progressing   Problem: Clinical Measurements: Goal: Will remain free from infection Outcome: Progressing Goal: Cardiovascular complication will be avoided Outcome: Progressing   Problem: Nutritional: Goal: Ability to make healthy dietary choices will improve Outcome: Progressing

## 2022-03-31 NOTE — Progress Notes (Signed)
Progress Note   Patient: Kara Mullins WCB:[id number] DOB: 12-07-1950 DOA: 03/27/2022     3 DOS: the patient was seen and examined on 03/31/2022   Brief hospital course: 71 y.o. female with medical history significant of paroxysmal atrial fibrillation, prolonged QT interval, hypothermia, chronic diastolic heart failure with her most recent EF of 55 to 60%, stage V CKD not on dialysis, hyperlipidemia, hypertension, COVID-19, generalized anxiety disorder, pancytopenia, history of urinary retention with chronic indwelling Foley catheter who was brought to the emergency department by her daughter due to elevated blood pressure measuring 220/150 mmHg at home due to the patient not taking her blood pressure medications.  Her daughter, gave her a tablet of carvedilol and brought her to the emergency department.  She has been basically bedbound at home, except for getting out of bed to a chair with assistance.  Her lower extremity edema has worsened considerably.  She and her daughter denied fever, chills, rhinorrhea, sore throat, wheezing or hemoptysis.  No chest pain, palpitations, diaphoresis, PND, orthopnea.  No abdominal pain, nausea, emesis, diarrhea, constipation, melena or hematochezia.  No flank pain, dysuria, frequency or hematuria.  No polyuria, polydipsia, polyphagia or blurred vision.   Pt found to have worsening renal function with volume overload. Nephrology consulted  Assessment and Plan: No notes have been filed under this hospital service. Service: Hospitalist Principal Problem:   Severe protein-calorie malnutrition ([place]) Due to severe proteinuria and presenting as:   Anasarca associated with disorder of kidney In the setting of:   CKD (chronic kidney disease) stage 5, GFR less than 15 ml/min [facility]) -Nephrology consulted, recs to continue diuresis as tolerated -Continue IV lasix, dose increased to 100mg  TID per Nephrology with metolazone  -Continued with low-dose losartan. -Per  nephrology, consideration for changing lasix to torsemide at d/c -Overall poor prognosis as pt is not candidate for HD -Palliative Care is consulted. Full Code confirmed at time of presentation -recheck bmet in AM   Active Problems:   Anemia of chronic kidney failure, stage 5 (HCC) Hgb earlier down to 6.5, now s/p 1 unit PRBC transfusion with remaining stable thus far Repeat cbc in AM     Leukocytopenia Monitor WBC.     Hypothermia Resolved. Completed course of rocephin for UTI     Essential hypertension Presenting with:   Hypertensive urgency Continue carvedilol 6.25 mg p.o. twice daily. Resume losartan 25 mg p.o. daily. BP stable at this time     Elevated troponin level not due myocardial infarction Secondary to demand ischemia.     Prolonged QT interval Check magnesium level. Avoid QT prolonging meds.     Paroxysmal atrial fibrillation (HCC) CHA?DS?-VASc Score of at least 5. (Age, female, HTN, diastolic CHF, aortic atherosclerosis). Not on anticoagulation. Continue beta-blocker for rate control.     Hyperlipidemia/aortic atherosclerosis. Has intolerance to statins.  Hypokalemia -Will replace -recheck bmet in AM        Subjective: States edema is improving. Denies sob  Physical Exam: Vitals:   03/31/22 0500 03/31/22 0551 03/31/22 0946 03/31/22 1147  BP:  (!) 171/84 (!) 158/81 (!) 176/85  Pulse:  (!) 58 (!) 58 (!) 56  Resp:  16 16 16   Temp:  97.7 F (36.5 C) (!) 97.4 F (36.3 C) 97.9 F (36.6 C)  TempSrc:  Oral Oral Oral  SpO2:  98% 100% 99%  Weight: 110.5 kg     Height:       General exam: Conversant, in no acute distress Respiratory system: normal chest rise,  clear, no audible wheezing Cardiovascular system: regular rhythm, s1-s2 Gastrointestinal system: Nondistended, nontender, pos BS Central nervous system: No seizures, no tremors Extremities: No cyanosis, no joint deformities Skin: No rashes, no pallor, generalized anasarca  improving Psychiatry: Affect normal // no auditory hallucinations   Data Reviewed:  Labs reviewed: 143, K 3.0, Cr 3.34   Family Communication: Pt in room, family not at bedside  Disposition: Status is: Inpatient Continue inpatient stay because: Severity of illness  Planned Discharge Destination: Home     Author: Marylu Lund, MD 03/31/2022 4:26 PM  For on call review [email or web address].

## 2022-03-31 NOTE — Progress Notes (Signed)
[facility] Progress Note  Subjective: 2L UOP yest and 1.4 L overnight.  Total net I/O are negative 4 L.   Vitals:   03/30/22 1300 03/30/22 2117 03/31/22 0500 03/31/22 0551  BP: (!) 157/96 (!) 147/81  (!) 171/84  Pulse: 63 62  (!) 58  Resp:  14  16  Temp: (!) 97.5 F (36.4 C) 97.6 F (36.4 C)  97.7 F (36.5 C)  TempSrc: Oral Oral  Oral  SpO2: 100% 100%  98%  Weight:   110.5 kg   Height:        Exam: Gen alert, no distress, elderly obese AAF lying in bed No jvd or bruits Chest occ rhonchi, no rales, no wheezing RRR no MRG Abd soft ntnd no mass or ascites +bs Ext diffuse bilat 2-3+ LE /hip / flanks and breast edema Neuro is alert, Ox3, gen'd weakness, debilitated    CXR - layering bilat effusions and bb atx vs infiltrate      Home meds include - coreg bid, lasix 40 qd, mvi, sod bicarb, zinc, losartan 25 qd, aldactone 12.5 qd      UA > 300 protein, 21-50 rbc, > 50 wbc, many bacteria    UP/C ration - 18.8 (approx 18mg  proteinuria per 24hrs)   Assessment/ Plan: CKD IV - w/ anasarca, decompensated due to renal failure, hypoalbuminemia, diast CHF  and severe nephrotic syndrome. UP/C ratio here is 18 gm/ d.  Pt on losartan at home and here 25 mg /d. Creat 3.0- 3.5 here now which is better than the last creat of 5 in march 2023. Admitted for worsening anasarca. Is getting IV lasix at 100 tid + metolazone 5 mg daily po w/ good response. Is net neg 4 L and feeling a bit better. Will needs several more days of diuresis most likely.  Prognosis/ GOC - pt not felt to be a candidate for immunosuppression/ dialysis due to severe debility. Renal function better now than in march (creat 5). If her debility improves she could be considered for dialysis. Will need CKA f/u after dc.  HTN - cont ARB, coreg. BP's a bit high.  Chronic debility - basically bedbound  Pyuria - per pmd PAF - per pmd Anemia - chronic disease/ ckd, Hb 8.1. Tranfuse prn.    Kara Mullins 03/31/2022, 6:48  AM   Recent Labs  Lab 03/28/22 1443 03/29/22 0430 03/30/22 0507  HGB 7.7* 7.7*  --   ALBUMIN  --  1.8* 1.6*  CALCIUM  --  7.1* 7.1*  PHOS  --  6.2* 6.1*  CREATININE  --  3.24* 3.22*  K  --  3.5 3.3*    Inpatient medications:  carvedilol  6.25 mg Oral BID   losartan  25 mg Oral Daily   metolazone  5 mg Oral Daily   sodium bicarbonate  650 mg Oral BID    furosemide 100 mg (03/31/22 0542)    acetaminophen **OR** acetaminophen, Muscle Rub, mouth rinse

## 2022-04-01 DIAGNOSIS — N049 Nephrotic syndrome with unspecified morphologic changes: Secondary | ICD-10-CM | POA: Diagnosis not present

## 2022-04-01 DIAGNOSIS — D631 Anemia in chronic kidney disease: Secondary | ICD-10-CM | POA: Diagnosis not present

## 2022-04-01 DIAGNOSIS — I700 Atherosclerosis of aorta: Secondary | ICD-10-CM | POA: Diagnosis not present

## 2022-04-01 DIAGNOSIS — N185 Chronic kidney disease, stage 5: Secondary | ICD-10-CM | POA: Diagnosis not present

## 2022-04-01 LAB — RENAL FUNCTION PANEL
Albumin: 1.6 g/dL — ABNORMAL LOW (ref 3.5–5.0)
Anion gap: 6 (ref 5–15)
BUN: 60 mg/dL — ABNORMAL HIGH (ref 8–23)
CO2: 24 mmol/L (ref 22–32)
Calcium: 7.3 mg/dL — ABNORMAL LOW (ref 8.9–10.3)
Chloride: 114 mmol/L — ABNORMAL HIGH (ref 98–111)
Creatinine, Ser: 3.36 mg/dL — ABNORMAL HIGH (ref 0.44–1.00)
GFR, Estimated: 14 mL/min — ABNORMAL LOW (ref >60)
Glucose, Bld: 107 mg/dL — ABNORMAL HIGH (ref 70–99)
Phosphorus: 6.3 mg/dL — ABNORMAL HIGH (ref 2.5–4.6)
Potassium: 3.4 mmol/L — ABNORMAL LOW (ref 3.5–5.1)
Sodium: 144 mmol/L (ref 135–145)

## 2022-04-01 LAB — FERRITIN: Ferritin: 593 ng/mL — ABNORMAL HIGH (ref 11–307)

## 2022-04-01 LAB — IRON AND TIBC
Iron: 40 ug/dL (ref 28–170)
Saturation Ratios: 36 % — ABNORMAL HIGH (ref 10.4–31.8)
TIBC: 112 ug/dL — ABNORMAL LOW (ref 250–450)
UIBC: 72 ug/dL

## 2022-04-01 MED ORDER — CALCIUM ACETATE (PHOS BINDER) 667 MG PO CAPS
667.0000 mg | ORAL_CAPSULE | Freq: Three times a day (TID) | ORAL | Status: DC
  Administered 2022-04-01 – 2022-04-05 (×11): 667 mg via ORAL
  Filled 2022-04-01 (×13): qty 1

## 2022-04-01 MED ORDER — METOLAZONE 5 MG PO TABS
5.0000 mg | ORAL_TABLET | Freq: Every day | ORAL | Status: DC
  Administered 2022-04-02 – 2022-04-05 (×4): 5 mg via ORAL
  Filled 2022-04-01 (×4): qty 1

## 2022-04-01 MED ORDER — SODIUM CHLORIDE 0.9 % IV SOLN: INTRAVENOUS | Status: DC | PRN

## 2022-04-01 MED ORDER — DARBEPOETIN ALFA 40 MCG/0.4ML IJ SOSY
40.0000 ug | PREFILLED_SYRINGE | INTRAMUSCULAR | Status: DC
  Administered 2022-04-01: 40 ug via SUBCUTANEOUS
  Filled 2022-04-01: qty 0.4

## 2022-04-01 MED ORDER — FUROSEMIDE 10 MG/ML IJ SOLN
80.0000 mg | Freq: Two times a day (BID) | INTRAMUSCULAR | Status: DC
  Administered 2022-04-01 – 2022-04-05 (×8): 80 mg via INTRAVENOUS
  Filled 2022-04-01 (×8): qty 8

## 2022-04-01 MED ORDER — ALBUMIN HUMAN 25 % IV SOLN
25.0000 g | Freq: Once | INTRAVENOUS | Status: AC
Start: 2022-04-01 — End: 2022-04-01
  Administered 2022-04-01: 25 g via INTRAVENOUS
  Filled 2022-04-01: qty 100

## 2022-04-01 MED ORDER — FUROSEMIDE 10 MG/ML IJ SOLN
80.0000 mg | Freq: Two times a day (BID) | INTRAMUSCULAR | Status: DC
Start: 2022-04-02 — End: 2022-04-01

## 2022-04-01 NOTE — Progress Notes (Signed)
[facility] Progress Note  Subjective:  Kara Mullins had 2.7 liters UOP over 6/18 charted.  Kara Mullins has been on lasix 100 mg IV every 8 hours and metolazone 5 mg daily.  Last weight 107.7 kg.  Kara Mullins asked about a renal diet.  Spoke with her daughter via speakerphone.  Pt follows with [facility] but does not have a nephrologist.  Kara Mullins was on lasix 40 mg daily PRN and was taking most of the time.  Kara Mullins states that Kara Mullins was ambulatory with a cane prior to March of this year.  Kara Mullins asks about a renal diet restriction because Kara Mullins wants to be diligent - her K has been ok.  Kara Mullins and her daughter and I discussed risks/benefit/indication for ESA and Kara Mullins does consent to ESA - no malignancy that they are aware of.   Review of systems:  Denies n/v/d Denies shortness of breath or chest pain    Vitals:   04/01/22 0411 04/01/22 0500 04/01/22 1044 04/01/22 1227  BP: (!) 166/80  (!) 169/97 (!) 158/75  Pulse: (!) 56  (!) 51 (!) 53  Resp: 16  16 18   Temp:   (!) 97.4 F (36.3 C)   TempSrc:   Oral   SpO2: 100%  100% 100%  Weight:  107.7 kg    Height:        Exam:   Gen alert, no distress, elderly obese habitus in bed in NAD  HEENT NCAT Neck supple trachea midline Chest occ rhonchi, no rales, no wheezing RRR no rubs Abd soft nt/nd; obese habitus Ext diffuse bilat 2-3+ LE /hip / flanks and breast edema Neuro is alert, Ox3, gen'd weakness, debilitated Psych normal mood and affect  GU - foley catheter in place with urine     CXR - layering bilat effusions and bb atx vs infiltrate      Home meds include - coreg bid, lasix 40 qd, mvi, sod bicarb, zinc, losartan 25 qd, aldactone 12.5 qd      UA > 300 protein, 21-50 rbc, > 50 wbc, many bacteria    UP/C ration - 18.8 (approx 18mg  proteinuria per 24hrs)   Assessment/ Plan: CKD IV - w/ anasarca, decompensated due to renal failure, hypoalbuminemia, diast CHF and severe nephrotic syndrome. UP/C ratio here is 18 gm/ d.  Pt on losartan at home and here 25  mg /d. Creat 3.0- 3.5 here now which is better than the last creat of 5 in march 2023. Admitted for worsening anasarca.  Felt that in the past Kara Mullins was not a candidate for dialysis.  Kara Mullins previously had AKI on CKD 3b - they felt could not rule out plasma cell dyscrasia Continue lasix and metolazone.  Kara Mullins's overloaded but hypoalbuminemic.  Reduce lasix to 80 mg IV BID. Will also give albumin 25 gram IV once.   Will needs several more days of IV diuresis Relax diet - change to heart healthy diet with 1.2 liter fluid restriction Prognosis/ GOC - pt not felt to be a candidate for immunosuppression/ dialysis due to severe debility. Renal function thankfully better now than in march (creat mid 5's at that time) but overload. If her debility improves Kara Mullins could be considered for dialysis however Kara Mullins is bedbound currently.  HTN - cont ARB, coreg. Hypertensive. Optimize volume status  Chronic debility - basically bedbound per charting but this started in March   Pyuria - per pmd; multiple species present  PAF - per primary team Anemia - chronic disease/ ckd.  Update CBC  in am. Tranfuse prn. update iron panel. Start aranesp  Metabolic bone disease and Hyperphosphatemia - obtain intact PTH. Start phoslo.     Disposition - continue inpatient monitoring   Recent Labs  Lab 03/28/22 1443 03/29/22 0430 03/30/22 0507 03/31/22 0529 04/01/22 0417  HGB 7.7* 7.7*  --   --   --   ALBUMIN  --  1.8*   < > 1.7* 1.6*  CALCIUM  --  7.1*   < > 7.2* 7.3*  PHOS  --  6.2*   < > 6.3* 6.3*  CREATININE  --  3.24*   < > 3.34* 3.36*  K  --  3.5   < > 3.0* 3.4*   < > = values in this interval not displayed.   Inpatient medications:  carvedilol  6.25 mg Oral BID   losartan  25 mg Oral Daily   metolazone  5 mg Oral Daily   sodium bicarbonate  650 mg Oral BID    furosemide 100 mg (04/01/22 1053)    acetaminophen **OR** acetaminophen, Muscle Rub, mouth rinse   Claudia Desanctis, MD 2:09 PM 04/01/2022

## 2022-04-01 NOTE — Progress Notes (Signed)
   04/01/22 1617  Assess: MEWS Score  Temp (!) 94.8 F (34.9 C)  BP (!) 143/75  MAP (mmHg) 96  Pulse Rate (!) 54  Resp 17  SpO2 100 %  O2 Device Room Air  Assess: MEWS Score  MEWS Temp 2  MEWS Systolic 0  MEWS Pulse 0  MEWS RR 0  MEWS LOC 0  MEWS Score 2  MEWS Score Color Yellow  Assess: if the MEWS score is Yellow or Red  Were vital signs taken at a resting state? Yes  Focused Assessment Change from prior assessment (see assessment flowsheet)  Does the patient meet 2 or more of the SIRS criteria? No  MEWS guidelines implemented *See Row Information* Yes  Treat  MEWS Interventions Escalated (See documentation below) (MD made aware)  Take Vital Signs  Increase Vital Sign Frequency  Yellow: Q 2hr X 2 then Q 4hr X 2, if remains yellow, continue Q 4hrs  Escalate  MEWS: Escalate Yellow: discuss with charge nurse/RN and consider discussing with provider and RRT  Notify: Charge Nurse/RN  Name of Charge Nurse/RN Notified Chancy Hurter, RN  Date Charge Nurse/RN Notified 04/01/22  Time Charge Nurse/RN Notified 1628  Notify: Provider  Provider Name/Title Dr. Wyline Copas  Date Provider Notified 04/01/22  Time Provider Notified 1629  Method of Notification Page  Notification Reason Change in status  Assess: SIRS CRITERIA  SIRS Temperature  1  SIRS Pulse 0  SIRS Respirations  0  SIRS WBC 0  SIRS Score Sum  1

## 2022-04-01 NOTE — Care Management Important Message (Signed)
Important Message  Patient Details IM Letter given to the Patient. Name: Kara Mullins MRN: [id number] Date of Birth: [birth year]   Medicare Important Message Given:  Yes     [name] 04/01/2022, 1:41 PM

## 2022-04-01 NOTE — Progress Notes (Signed)
Progress Note   Patient: Kara Mullins ZOX:[id number] DOB: [birth year] DOA: 03/27/2022     4 DOS: the patient was seen and examined on 04/01/2022   Brief hospital course: 71 y.o. female with medical history significant of paroxysmal atrial fibrillation, prolonged QT interval, hypothermia, chronic diastolic heart failure with her most recent EF of 55 to 60%, stage V CKD not on dialysis, hyperlipidemia, hypertension, COVID-19, generalized anxiety disorder, pancytopenia, history of urinary retention with chronic indwelling Foley catheter who was brought to the emergency department by her daughter due to elevated blood pressure measuring 220/150 mmHg at home due to the patient not taking her blood pressure medications.  Her daughter, gave her a tablet of carvedilol and brought her to the emergency department.  She has been basically bedbound at home, except for getting out of bed to a chair with assistance.  Her lower extremity edema has worsened considerably.  She and her daughter denied fever, chills, rhinorrhea, sore throat, wheezing or hemoptysis.  No chest pain, palpitations, diaphoresis, PND, orthopnea.  No abdominal pain, nausea, emesis, diarrhea, constipation, melena or hematochezia.  No flank pain, dysuria, frequency or hematuria.  No polyuria, polydipsia, polyphagia or blurred vision.   Pt found to have worsening renal function with volume overload. Nephrology consulted  Assessment and Plan: No notes have been filed under this hospital service. Service: Hospitalist Principal Problem:   Severe protein-calorie malnutrition ([place]) Due to severe proteinuria and presenting as:   Anasarca associated with disorder of kidney In the setting of:   CKD (chronic kidney disease) stage 5, GFR less than 15 ml/min [facility]) -Nephrology consulted, recs to continue diuresis as tolerated -Continue IV lasix, dose decreased to 80mg  IV BID per Nephrology with metolazone  -Continued with low-dose losartan. -Another  dose of albumin per Nephrology recommended -Overall poor prognosis as pt is not candidate for HD -Palliative Care is consulted. Full Code confirmed at time of presentation -recheck bmet in AM   Active Problems:   Anemia of chronic kidney failure, stage 5 (HCC) -Hgb earlier down to 6.5, now s/p 1 unit PRBC transfusion with remaining stable thus far -Recheck bmet in AM     Leukocytopenia -Monitor WBC.     Hypothermia -Resolved. -Completed course of rocephin for UTI     Essential hypertension Presenting with:   Hypertensive urgency -Continue carvedilol 6.25 mg p.o. twice daily. -Resume losartan 25 mg p.o. daily. -BP stable at this time     Elevated troponin level not due myocardial infarction Secondary to demand ischemia.     Prolonged QT interval -Check magnesium level. -Avoid QT prolonging meds.     Paroxysmal atrial fibrillation (HCC) -CHA?DS?-VASc Score of at least 5. (Age, female, HTN, diastolic CHF, aortic atherosclerosis). Not on anticoagulation. Continue beta-blocker for rate control.     Hyperlipidemia/aortic atherosclerosis. -Has intolerance to statins.  Hypokalemia -Will replace -recheck bmet in AM       Subjective: Reports edema is improving  Physical Exam: Vitals:   04/01/22 0411 04/01/22 0500 04/01/22 1044 04/01/22 1227  BP: (!) 166/80  (!) 169/97 (!) 158/75  Pulse: (!) 56  (!) 51 (!) 53  Resp: 16  16 18   Temp:   (!) 97.4 F (36.3 C)   TempSrc:   Oral   SpO2: 100%  100% 100%  Weight:  107.7 kg    Height:       General exam: Awake, laying in bed, in nad Respiratory system: Normal respiratory effort, no wheezing Cardiovascular system: regular rate, s1, s2 Gastrointestinal system:  Soft, nondistended, positive BS Central nervous system: CN2-12 grossly intact, strength intact Extremities: Perfused, no clubbing Skin: Normal skin turgor, no notable skin lesions seen Psychiatry: Mood normal // no visual hallucinations   Data Reviewed:  Labs  reviewed: 144, K 3.4, Cr 3.36   Family Communication: Pt in room, family not at bedside  Disposition: Status is: Inpatient Continue inpatient stay because: Severity of illness  Planned Discharge Destination: Home     Author: Marylu Lund, MD 04/01/2022 2:34 PM  For on call review [email or web address].

## 2022-04-01 NOTE — Progress Notes (Signed)
Assumed patient care from [facility], RN at 1500. Agree with previously documented assessment. Will continue current plan of care.

## 2022-04-02 DIAGNOSIS — N049 Nephrotic syndrome with unspecified morphologic changes: Secondary | ICD-10-CM | POA: Diagnosis not present

## 2022-04-02 DIAGNOSIS — D631 Anemia in chronic kidney disease: Secondary | ICD-10-CM | POA: Diagnosis not present

## 2022-04-02 DIAGNOSIS — N185 Chronic kidney disease, stage 5: Secondary | ICD-10-CM | POA: Diagnosis not present

## 2022-04-02 DIAGNOSIS — I700 Atherosclerosis of aorta: Secondary | ICD-10-CM | POA: Diagnosis not present

## 2022-04-02 LAB — RENAL FUNCTION PANEL
Albumin: 1.8 g/dL — ABNORMAL LOW (ref 3.5–5.0)
Anion gap: 7 (ref 5–15)
BUN: 59 mg/dL — ABNORMAL HIGH (ref 8–23)
CO2: 25 mmol/L (ref 22–32)
Calcium: 7.5 mg/dL — ABNORMAL LOW (ref 8.9–10.3)
Chloride: 113 mmol/L — ABNORMAL HIGH (ref 98–111)
Creatinine, Ser: 3.25 mg/dL — ABNORMAL HIGH (ref 0.44–1.00)
GFR, Estimated: 15 mL/min — ABNORMAL LOW (ref >60)
Glucose, Bld: 81 mg/dL (ref 70–99)
Phosphorus: 6 mg/dL — ABNORMAL HIGH (ref 2.5–4.6)
Potassium: 3.7 mmol/L (ref 3.5–5.1)
Sodium: 145 mmol/L (ref 135–145)

## 2022-04-02 LAB — CBC
HCT: 22.5 % — ABNORMAL LOW (ref 36.0–46.0)
Hemoglobin: 7.1 g/dL — ABNORMAL LOW (ref 12.0–15.0)
MCH: 30.3 pg (ref 26.0–34.0)
MCHC: 31.6 g/dL (ref 30.0–36.0)
MCV: 96.2 fL (ref 80.0–100.0)
Platelets: 127 10*3/uL — ABNORMAL LOW (ref 150–400)
RBC: 2.34 MIL/uL — ABNORMAL LOW (ref 3.87–5.11)
RDW: 14.4 % (ref 11.5–15.5)
WBC: 3.5 10*3/uL — ABNORMAL LOW (ref 4.0–10.5)
nRBC: 0 % (ref 0.0–0.2)

## 2022-04-02 LAB — PREPARE RBC (CROSSMATCH)

## 2022-04-02 MED ORDER — ALBUMIN HUMAN 25 % IV SOLN
25.0000 g | Freq: Once | INTRAVENOUS | Status: AC
  Administered 2022-04-02: 25 g via INTRAVENOUS
  Filled 2022-04-02: qty 100

## 2022-04-02 MED ORDER — SODIUM CHLORIDE 0.9% IV SOLUTION: Freq: Once | INTRAVENOUS | Status: AC

## 2022-04-02 MED ORDER — HYDRALAZINE HCL 20 MG/ML IJ SOLN
10.0000 mg | INTRAMUSCULAR | Status: DC | PRN
  Administered 2022-04-02: 10 mg via INTRAVENOUS
  Filled 2022-04-02: qty 1

## 2022-04-02 MED ORDER — POLYSACCHARIDE IRON COMPLEX 150 MG PO CAPS
150.0000 mg | ORAL_CAPSULE | Freq: Every day | ORAL | Status: DC
  Administered 2022-04-02 – 2022-04-08 (×7): 150 mg via ORAL
  Filled 2022-04-02 (×7): qty 1

## 2022-04-02 MED ORDER — POTASSIUM CHLORIDE CRYS ER 20 MEQ PO TBCR
40.0000 meq | EXTENDED_RELEASE_TABLET | Freq: Once | ORAL | Status: AC
  Administered 2022-04-02: 40 meq via ORAL
  Filled 2022-04-02: qty 2

## 2022-04-02 NOTE — Progress Notes (Signed)
[facility] [address] [employer] [facility]) Hospital Liaison note:  This patient is currently enrolled in [facility] outpatient-based Palliative Care. Will continue to follow for disposition.  Please call with any outpatient palliative questions or concerns.  Thank you, Lorelee Market, LPN [facility] Liaison [phone]

## 2022-04-02 NOTE — Progress Notes (Signed)
Progress Note   Patient: Kara Mullins CHE:[id number] DOB: [birth year] DOA: 03/27/2022     5 DOS: the patient was seen and examined on 04/02/2022   Brief hospital course: 71 y.o. female with medical history significant of paroxysmal atrial fibrillation, prolonged QT interval, hypothermia, chronic diastolic heart failure with her most recent EF of 55 to 60%, stage V CKD not on dialysis, hyperlipidemia, hypertension, COVID-19, generalized anxiety disorder, pancytopenia, history of urinary retention with chronic indwelling Foley catheter who was brought to the emergency department by her daughter due to elevated blood pressure measuring 220/150 mmHg at home due to the patient not taking her blood pressure medications.  Her daughter, gave her a tablet of carvedilol and brought her to the emergency department.  She has been basically bedbound at home, except for getting out of bed to a chair with assistance.  Her lower extremity edema has worsened considerably.  She and her daughter denied fever, chills, rhinorrhea, sore throat, wheezing or hemoptysis.  No chest pain, palpitations, diaphoresis, PND, orthopnea.  No abdominal pain, nausea, emesis, diarrhea, constipation, melena or hematochezia.  No flank pain, dysuria, frequency or hematuria.  No polyuria, polydipsia, polyphagia or blurred vision.   Pt found to have worsening renal function with volume overload. Nephrology consulted  Assessment and Plan: No notes have been filed under this hospital service. Service: Hospitalist Principal Problem:   Severe protein-calorie malnutrition ([place]) Due to severe proteinuria and presenting as:   Anasarca associated with disorder of kidney In the setting of:   CKD (chronic kidney disease) stage 5, GFR less than 15 ml/min [facility]) -Nephrology consulted, recs to continue diuresis as tolerated -Continue IV lasix, dose decreased to 80mg  IV BID per Nephrology with metolazone  -Continued with low-dose losartan. -Overall  poor prognosis as pt is not candidate for HD -Palliative Care is consulted. Full Code confirmed at time of presentation -continues to void well -Recheck bmet in AM   Active Problems:   Anemia of chronic kidney failure, stage 5 (HCC) -Hgb earlier down to 6.5, receiving s/p 1 unit PRBC transfusion  -Hgb now remains stable -Recheck bmet and CBC in AM     Leukocytopenia -Monitor WBC.     Hypothermia -Resolved. -Completed course of rocephin for UTI     Essential hypertension Presenting with:   Hypertensive urgency -Continue carvedilol 6.25 mg p.o. twice daily. -Resume losartan 25 mg p.o. daily. -BP stable at this time     Elevated troponin level not due myocardial infarction Secondary to demand ischemia.     Prolonged QT interval -Check magnesium level. -Avoid QT prolonging meds.     Paroxysmal atrial fibrillation (HCC) -CHA?DS?-VASc Score of at least 5. (Age, female, HTN, diastolic CHF, aortic atherosclerosis). Followed by Cardiology Not on anticoagulation PTA Continue beta-blocker for rate control.     Hyperlipidemia/aortic atherosclerosis. -Has intolerance to statins.  Hypokalemia -Will replace -recheck bmet in AM       Subjective: States feeling better today  Physical Exam: Vitals:   04/01/22 2051 04/02/22 0500 04/02/22 0957 04/02/22 1239  BP: 137/71 (!) 157/81 (!) 146/96 (!) 146/83  Pulse: 60 62 64 62  Resp: 16 17 16 20   Temp:  (!) 97.3 F (36.3 C)  (!) 97.5 F (36.4 C)  TempSrc:      SpO2: 100% 100% 100% 100%  Weight:      Height:       General exam: Conversant, in no acute distress Respiratory system: normal chest rise, clear, no audible wheezing Cardiovascular system: regular rhythm,  s1-s2 Gastrointestinal system: Nondistended, nontender, pos BS Central nervous system: No seizures, no tremors Extremities: No cyanosis, no joint deformities Skin: No rashes, no pallor Psychiatry: Affect normal // no auditory hallucinations   Data  Reviewed:  Labs reviewed: 146, K 3.7, Cr 3.25   Family Communication: Pt in room, family not at bedside  Disposition: Status is: Inpatient Continue inpatient stay because: Severity of illness  Planned Discharge Destination: Home     Author: Marylu Lund, MD 04/02/2022 1:39 PM  For on call review [email or web address].

## 2022-04-02 NOTE — Progress Notes (Signed)
[facility] Progress Note  Subjective:  she had 2.3 liters UOP over 6/19 charted.  She was ambulatory with a cane prior to March of this year.  Hasn't been weighed today - nursing techs are coming in to do that.  She has had a foley in since her last hospitalization.  She and I discussed the risks/benefits/indications for PRBC's and she does consent to blood products.  She feels like she is getting a lot of fluid off.   On chart review, she had a bone marrow biopsy on 5/31.  Cannot locate a note from hem/onc after that which references the results - labs weren't available at the time of their last note.  Her overall presentation was concerning for amyloid however note that the bone marrow biopsy wasn't specific or diagnostic of a plasma cell neoplasm per path report; she did have a small focus suggestive of amyloid but the congo red stain on deeper sectioning was negative for amyloid.   Review of systems:   Denies n/v/d reports shortness of breath with exertion or chest pain    Vitals:   04/01/22 2051 04/02/22 0500 04/02/22 0957 04/02/22 1239  BP: 137/71 (!) 157/81 (!) 146/96 (!) 146/83  Pulse: 60 62 64 62  Resp: 16 17 16 20   Temp:  (!) 97.3 F (36.3 C)  (!) 97.5 F (36.4 C)  TempSrc:      SpO2: 100% 100% 100% 100%  Weight:      Height:        Exam:   Gen alert, no distress, elderly obese habitus in bed in NAD    HEENT NCAT Neck supple trachea midline Chest occ rhonchi, no rales, no wheezing RRR no rubs Abd soft nt/nd; obese habitus Ext diffuse bilat 2-3+ LE /hip / flanks and breast edema Neuro is alert, Ox3, gen'd weakness, debilitated Psych normal mood and affect  GU - foley catheter in place with urine     CXR - layering bilat effusions and bb atx vs infiltrate      Home meds include - coreg bid, lasix 40 qd, mvi, sod bicarb, zinc, losartan 25 qd, aldactone 12.5 qd      UA > 300 protein, 21-50 rbc, > 50 wbc, many bacteria    UP/C ration - 18.8 (approx  61m proteinuria per 24hrs)   Assessment/ Plan: CKD IV - w/ anasarca, decompensated due to renal failure, hypoalbuminemia, diast CHF and severe nephrotic syndrome. UP/C ratio here is 18 gm/ d.  Pt on losartan at home and here 25 mg /d. Creat 3.0- 3.5 here now which is better than the last creat of 5 in march 2023. Admitted for worsening anasarca.  Felt that in the past she was not a candidate for dialysis.  She previously had AKI on CKD 3b - they felt could not rule out plasma cell dyscrasia.  Per chart review elevated free light chain ratio 02/2021.  Per 10/2021 renal [place] right hydro and moderate left hydro  Continue lasix 80 mg IV BID.  On metolazone daily.  She's overloaded but hypoalbuminemic.  Will also give albumin 25 gram IV once.   Will need several more days of IV diuresis Send SPEP, UPEP, free light chains - once had concern for amyloid but not found on bone marrow biopsy path report  Pause losartan for now with aggressive diuresis Continue foley catheter (predates admission) Prognosis/ GOC - pt not felt to be a candidate for immunosuppression/ dialysis due to severe debility. Renal function thankfully  better now than in march (creat mid 5's at that time) but overload. If her debility improves she could be considered for dialysis however she is bedbound currently.  HTN - Pause losartan with aggressive diuresis.  Optimize volume status  Chronic debility - basically bedbound per charting but this started in March   Pyuria - per pmd; multiple species present  PAF - per primary team Anemia - chronic disease/ ckd.  Will give PRBC's 1 unit to optimize diuresis.  Transfuse PRN. iron deficiency but sats ok at 35%.  Add PO iron.  Started Aranesp 40 mcg on 6/19 and ordered for every Monday   Metabolic bone disease and Hyperphosphatemia - intact PTH pending. Started phoslo.     Disposition - continue inpatient monitoring for IV diuresis  Recent Labs  Lab 03/29/22 0430 03/30/22 0507  04/01/22 0417 04/02/22 0446  HGB 7.7*  --   --  7.1*  ALBUMIN 1.8*   < > 1.6* 1.8*  CALCIUM 7.1*   < > 7.3* 7.5*  PHOS 6.2*   < > 6.3* 6.0*  CREATININE 3.24*   < > 3.36* 3.25*  K 3.5   < > 3.4* 3.7   < > = values in this interval not displayed.   Inpatient medications:  calcium acetate  667 mg Oral TID WC   carvedilol  6.25 mg Oral BID   darbepoetin (ARANESP) injection - NON-DIALYSIS  40 mcg Subcutaneous Q Mon-1800   furosemide  80 mg Intravenous BID   losartan  25 mg Oral Daily   metolazone  5 mg Oral QAC breakfast    sodium chloride 10 mL/hr at 04/01/22 1546    sodium chloride, acetaminophen **OR** acetaminophen, Muscle Rub, mouth rinse   Claudia Desanctis, MD 04/02/2022 3:04 PM

## 2022-04-02 NOTE — Progress Notes (Signed)
Provider contacted to assist. See new orders  04/02/22 1825  Vitals  Temp 98.4 F (36.9 C)  Temp Source Oral  BP (!) 176/102  Pulse Rate 67  Resp 18  MEWS COLOR  MEWS Score Color Green  Oxygen Therapy  SpO2 100 %  O2 Device Room Air  MEWS Score  MEWS Temp 0  MEWS Systolic 0  MEWS Pulse 0  MEWS RR 0  MEWS LOC 0  MEWS Score 0

## 2022-04-03 DIAGNOSIS — N049 Nephrotic syndrome with unspecified morphologic changes: Secondary | ICD-10-CM | POA: Diagnosis not present

## 2022-04-03 LAB — BPAM RBC
Blood Product Expiration Date: 202307122359
ISSUE DATE / TIME: 202306201803
Unit Type and Rh: 6200

## 2022-04-03 LAB — TYPE AND SCREEN
ABO/RH(D): AB POS
Antibody Screen: NEGATIVE
Unit division: 0

## 2022-04-03 LAB — CBC
HCT: 25.6 % — ABNORMAL LOW (ref 36.0–46.0)
Hemoglobin: 8.2 g/dL — ABNORMAL LOW (ref 12.0–15.0)
MCH: 30.4 pg (ref 26.0–34.0)
MCHC: 32 g/dL (ref 30.0–36.0)
MCV: 94.8 fL (ref 80.0–100.0)
Platelets: 123 10*3/uL — ABNORMAL LOW (ref 150–400)
RBC: 2.7 MIL/uL — ABNORMAL LOW (ref 3.87–5.11)
RDW: 15.3 % (ref 11.5–15.5)
WBC: 3.9 10*3/uL — ABNORMAL LOW (ref 4.0–10.5)
nRBC: 0 % (ref 0.0–0.2)

## 2022-04-03 LAB — RENAL FUNCTION PANEL
Albumin: 2 g/dL — ABNORMAL LOW (ref 3.5–5.0)
Anion gap: 6 (ref 5–15)
BUN: 59 mg/dL — ABNORMAL HIGH (ref 8–23)
CO2: 25 mmol/L (ref 22–32)
Calcium: 7.7 mg/dL — ABNORMAL LOW (ref 8.9–10.3)
Chloride: 114 mmol/L — ABNORMAL HIGH (ref 98–111)
Creatinine, Ser: 3.28 mg/dL — ABNORMAL HIGH (ref 0.44–1.00)
GFR, Estimated: 15 mL/min — ABNORMAL LOW (ref >60)
Glucose, Bld: 88 mg/dL (ref 70–99)
Phosphorus: 6.1 mg/dL — ABNORMAL HIGH (ref 2.5–4.6)
Potassium: 3.7 mmol/L (ref 3.5–5.1)
Sodium: 145 mmol/L (ref 135–145)

## 2022-04-03 LAB — KAPPA/LAMBDA LIGHT CHAINS
Kappa free light chain: 346.9 mg/L — ABNORMAL HIGH (ref 3.3–19.4)
Kappa, lambda light chain ratio: 3.91 — ABNORMAL HIGH (ref 0.26–1.65)
Lambda free light chains: 88.8 mg/L — ABNORMAL HIGH (ref 5.7–26.3)

## 2022-04-03 LAB — PARATHYROID HORMONE, INTACT (NO CA): PTH: 125 pg/mL — ABNORMAL HIGH (ref 15–65)

## 2022-04-03 MED ORDER — ALBUMIN HUMAN 25 % IV SOLN
25.0000 g | Freq: Once | INTRAVENOUS | Status: AC
  Administered 2022-04-03: 25 g via INTRAVENOUS
  Filled 2022-04-03: qty 100

## 2022-04-03 NOTE — Progress Notes (Signed)
[facility] Progress Note  Subjective:  she had 3 liters UOP over 6/20 charted.  She feels ok today.    Review of systems:   Denies n/v/d reports shortness of breath with exertion is improved; denies chest pain   ------------------ Supplemental hx:  She was ambulatory with a cane prior to March of this year.  She has had a foley in since her last hospitalization.  On chart review, she had a bone marrow biopsy on 5/31.  Cannot locate a note from hem/onc after that which references the results - labs weren't available at the time of their last note.  Her overall presentation was concerning for amyloid however note that the bone marrow biopsy wasn't specific or diagnostic of a plasma cell neoplasm per path report; she did have a small focus suggestive of amyloid but the congo red stain on deeper sectioning was negative for amyloid.   Vitals:   04/02/22 2100 04/03/22 0014 04/03/22 0530 04/03/22 1138  BP: 137/71 (!) 148/80 (!) 145/72 (!) 150/72  Pulse: 74 72 65 63  Resp: 18  16 16   Temp: 98.9 F (37.2 C)  98.5 F (36.9 C) 98.6 F (37 C)  TempSrc: Oral   Oral  SpO2: 99%  100% 100%  Weight:      Height:        Physical Exam: General adult female in bed in no acute distress at rest HEENT normocephalic atraumatic extraocular movements intact sclera anicteric Neck supple trachea midline Lungs clear to auscultation bilaterally normal work of breathing at rest  Heart S1S2 no rub Abdomen soft nontender nondistended Extremities 2-3+ edema bilateral lower extremities Psych normal mood and affect Neuro and oriented x 3 provides hx and follows commands GU - foley catheter in place with urine     CXR - layering bilat effusions and bb atx vs infiltrate      Home meds include - coreg bid, lasix 40 qd, mvi, sod bicarb, zinc, losartan 25 qd, aldactone 12.5 qd      UA > 300 protein, 21-50 rbc, > 50 wbc, many bacteria    UP/C ration - 18.8 (approx 61m proteinuria per 24hrs)    Assessment/ Plan: CKD IV - w/ anasarca, decompensated due to renal failure, hypoalbuminemia, diast CHF and severe nephrotic syndrome. UP/C ratio here is 18 gm/ d.  Pt on losartan at home and here 25 mg /d. Creat 3.0- 3.5 here now which is better than the last creat of 5 in march 2023. Admitted for worsening anasarca.  Felt that in the past she was not a candidate for dialysis.  She previously had AKI on CKD 3b - they felt could not rule out plasma cell dyscrasia.  Per chart review elevated free light chain ratio 02/2021.  Per 10/2021 renal [place] right hydro and moderate left hydro  Continue lasix 80 mg IV BID.  On metolazone daily.  She's overloaded but hypoalbuminemic.  Albumin once again today  Will need continued IV diuresis here but is making progress SPEP, UPEP, and free light chains are pending - once had concern for amyloid but not found on bone marrow biopsy path report  I have paused losartan for now with aggressive diuresis Continue foley catheter (predates admission) Prognosis/ GOC - pt not felt to be a candidate for immunosuppression/ dialysis due to severe debility. Renal function thankfully better now than in march (creat mid 5's at that time) but overload. If her debility improves she could be considered for dialysis however she  is bedbound currently.  HTN - Pause losartan with aggressive diuresis.  Optimize volume status  Chronic debility - basically bedbound per charting but this started in March   Pyuria - per pmd; multiple species present  PAF - per primary team Anemia - chronic disease/ ckd.  S/p PRBC's 1 unit on 6/20 to optimize diuresis.  Transfuse PRN. iron deficiency but sats ok at 35%.  On PO iron.  Started Aranesp 40 mcg on 6/19 and ordered for every Monday   Metabolic bone disease and Hyperphosphatemia - intact PTH is 125. Defer calcitriol. Started phoslo.     Disposition - continue inpatient care for IV diuresis  Recent Labs  Lab 04/02/22 0446 04/03/22 0529  HGB  7.1* 8.2*  ALBUMIN 1.8* 2.0*  CALCIUM 7.5* 7.7*  PHOS 6.0* 6.1*  CREATININE 3.25* 3.28*  K 3.7 3.7   Inpatient medications:  calcium acetate  667 mg Oral TID WC   carvedilol  6.25 mg Oral BID   darbepoetin (ARANESP) injection - NON-DIALYSIS  40 mcg Subcutaneous Q Mon-1800   furosemide  80 mg Intravenous BID   iron polysaccharides  150 mg Oral Daily   metolazone  5 mg Oral QAC breakfast    sodium chloride 10 mL/hr at 04/01/22 1546    sodium chloride, acetaminophen **OR** acetaminophen, hydrALAZINE, Muscle Rub, mouth rinse   Claudia Desanctis, MD 04/03/2022 1:33 PM

## 2022-04-03 NOTE — Progress Notes (Signed)
PROGRESS NOTE    Kara Mullins  YHC:[id number] DOB: 1951-02-21 DOA: 03/27/2022 PCP: [name], NP   Brief Narrative:  71 y.o. female with medical history significant of paroxysmal atrial fibrillation, prolonged QT interval, hypothermia, chronic diastolic heart failure with her most recent EF of 55 to 60%, stage V CKD not on dialysis, hyperlipidemia, hypertension, COVID-19, generalized anxiety disorder, pancytopenia, history of urinary retention with chronic indwelling Foley catheter who was brought to the emergency department by her daughter due to elevated blood pressure measuring 220/150 mmHg at home due to the patient not taking her blood pressure medications.  Her daughter, gave her a tablet of carvedilol and brought her to the emergency department.  She has been basically bedbound at home, except for getting out of bed to a chair with assistance.  Her lower extremity edema has worsened considerably.  She and her daughter denied fever, chills, rhinorrhea, sore throat, wheezing or hemoptysis.  No chest pain, palpitations, diaphoresis, PND, orthopnea.  No abdominal pain, nausea, emesis, diarrhea, constipation, melena or hematochezia.  No flank pain, dysuria, frequency or hematuria.  No polyuria, polydipsia, polyphagia or blurred vision.    Pt found to have worsening renal function with volume overload. Nephrology consulted  Assessment & Plan:   Principal Problem:   Anasarca associated with disorder of kidney Active Problems:   Hypertensive urgency   Elevated troponin level not due myocardial infarction   Hypothermia   Paroxysmal atrial fibrillation (HCC)   CKD (chronic kidney disease) stage 5, GFR less than 15 ml/min (HCC)   Anemia of chronic kidney failure, stage 5 ([place])   Essential hypertension   Hyperlipidemia   Severe protein-calorie malnutrition (HCC)   Prolonged QT interval   Leukocytopenia   Aortic atherosclerosis ([place])   ARF (acute renal failure) (HCC)  Severe  protein-calorie malnutrition (HCC) Due to severe proteinuria and presenting as:   Anasarca associated with disorder of kidney In the setting of:   CKD (chronic kidney disease) stage 5, GFR less than 15 ml/min [facility]) -Nephrology consulted, recs to continue diuresis as tolerated -Continue IV lasix, dose decreased to 80mg  IV BID per Nephrology with metolazone.  Close not on hold. -Interestingly, patient is signed up as hospice, palliative Care is consulted. Full Code confirmed at time of presentation  Anemia of chronic disease: Baseline hemoglobin ranges anywhere from 7.5-9.Hemoglobin dropped to 6.5 on 03/28/2022, received 1 unit of PRBC transfusion.  Dropped again to 7.1 on 04/02/2022 and received another PRBC transfusion.  Monitor daily.  Leukocytopenia -Monitor WBC.   Hypothermia/UTI: -Resolved. -Completed course of rocephin for UTI     Essential hypertension Presenting with:   Hypertensive urgency.  Blood pressure now fairly controlled on Coreg and Lasix.  Losartan on hold.    Elevated troponin level not due myocardial infarction Secondary to demand ischemia.     Prolonged QT interval -Check magnesium level. -Avoid QT prolonging meds.     Paroxysmal atrial fibrillation (HCC) -CHA?DS?-VASc Score of at least 5. (Age, female, HTN, diastolic CHF, aortic atherosclerosis). Followed by Cardiology Not on anticoagulation PTA Continue beta-blocker for rate control.   Hyperlipidemia/aortic atherosclerosis. -Has intolerance to statins.   Hypokalemia: Resolved.  DVT prophylaxis: SCDs Start: 03/27/22 0734   Code Status: Full Code  Family Communication:  None present at bedside.  Plan of care discussed with patient in length and he/she verbalized understanding and agreed with it.  Status is: Inpatient Remains inpatient appropriate because: Still with significant anasarca.   Estimated body mass index is 38.67 kg/m as  calculated from the following:   Height as of this encounter: 5\' 5"   (1.651 m).   Weight as of this encounter: 105.4 kg.  Pressure Injury 03/28/22 Sacrum Mid Stage 1 -  Intact skin with non-blanchable redness of a localized area usually over a bony prominence. Area is darker and non-blanchable. Intact, clean, dry. (Active)  03/28/22 0126  Location: Sacrum  Location Orientation: Mid  Staging: Stage 1 -  Intact skin with non-blanchable redness of a localized area usually over a bony prominence.  Wound Description (Comments): Area is darker and non-blanchable. Intact, clean, dry.  Present on Admission: Yes  Dressing Type Foam - Lift dressing to assess site every shift 04/02/22 2017   Nutritional Assessment: Body mass index is 38.67 kg/m.Marland Kitchen Seen by dietician.  I agree with the assessment and plan as outlined below: Nutrition Status:        . Skin Assessment: I have examined the patient's skin and I agree with the wound assessment as performed by the wound care RN as outlined below: Pressure Injury 03/28/22 Sacrum Mid Stage 1 -  Intact skin with non-blanchable redness of a localized area usually over a bony prominence. Area is darker and non-blanchable. Intact, clean, dry. (Active)  03/28/22 0126  Location: Sacrum  Location Orientation: Mid  Staging: Stage 1 -  Intact skin with non-blanchable redness of a localized area usually over a bony prominence.  Wound Description (Comments): Area is darker and non-blanchable. Intact, clean, dry.  Present on Admission: Yes  Dressing Type Foam - Lift dressing to assess site every shift 04/02/22 2017    Consultants:  Nephrology  Procedures:  None  Antimicrobials:  Anti-infectives (From admission, onward)    Start     Dose/Rate Route Frequency Ordered Stop   03/27/22 1200  cefTRIAXone (ROCEPHIN) 1 g in sodium chloride 0.9 % 100 mL IVPB  Status:  Discontinued        1 g 200 mL/hr over 30 Minutes Intravenous Every 24 hours 03/27/22 0941 03/30/22 1424         Subjective: Patient seen and examined.  She  has no complaints.  Objective: Vitals:   04/02/22 1912 04/02/22 2100 04/03/22 0014 04/03/22 0530  BP: (!) 175/99 137/71 (!) 148/80 (!) 145/72  Pulse: 71 74 72 65  Resp:  18  16  Temp:  98.9 F (37.2 C)  98.5 F (36.9 C)  TempSrc:  Oral    SpO2:  99%  100%  Weight:      Height:        Intake/Output Summary (Last 24 hours) at 04/03/2022 1131 Last data filed at 04/03/2022 0900 Gross per 24 hour  Intake 870.93 ml  Output 3360 ml  Net -2489.07 ml   Filed Weights   03/31/22 0500 04/01/22 0500 04/02/22 1458  Weight: 110.5 kg 107.7 kg 105.4 kg    Examination:  General exam: Appears calm and comfortable  Respiratory system: Clear to auscultation. Respiratory effort normal. Cardiovascular system: S1 & S2 heard, RRR. No JVD, murmurs, rubs, gallops or clicks.  Bilateral +2 upper and lower extremity edema. Gastrointestinal system: Abdomen is nondistended, soft and nontender. No organomegaly or masses felt. Normal bowel sounds heard. Central nervous system: Alert and oriented. No focal neurological deficits. Extremities: Symmetric 5 x 5 power. Skin: No rashes, lesions or ulcers Psychiatry: Judgement and insight appear normal. Mood & affect appropriate.    Data Reviewed: I have personally reviewed following labs and imaging studies  CBC: Recent Labs  Lab 03/28/22 0526 03/28/22  1443 03/29/22 0430 04/02/22 0446 04/03/22 0529  WBC 3.1*  --  3.7* 3.5* 3.9*  HGB 6.5* 7.7* 7.7* 7.1* 8.2*  HCT 21.1* 24.0* 24.2* 22.5* 25.6*  MCV 95.9  --  96.0 96.2 94.8  PLT 125*  --  133* 127* 681*   Basic Metabolic Panel: Recent Labs  Lab 03/30/22 0507 03/31/22 0529 04/01/22 0417 04/02/22 0446 04/03/22 0529  NA 145 143 144 145 145  K 3.3* 3.0* 3.4* 3.7 3.7  CL 117* 112* 114* 113* 114*  CO2 24 23 24 25 25   GLUCOSE 73 87 107* 81 88  BUN 55* 58* 60* 59* 59*  CREATININE 3.22* 3.34* 3.36* 3.25* 3.28*  CALCIUM 7.1* 7.2* 7.3* 7.5* 7.7*  PHOS 6.1* 6.3* 6.3* 6.0* 6.1*   GFR: Estimated  Creatinine Clearance: 19.5 mL/min (A) (by C-G formula based on SCr of 3.28 mg/dL (H)). Liver Function Tests: Recent Labs  Lab 03/29/22 0430 03/30/22 0507 03/31/22 0529 04/01/22 0417 04/02/22 0446 04/03/22 0529  AST 27  --   --   --   --   --   ALT 16  --   --   --   --   --   ALKPHOS 117  --   --   --   --   --   BILITOT 0.4  --   --   --   --   --   PROT 5.1*  --   --   --   --   --   ALBUMIN 1.8* 1.6* 1.7* 1.6* 1.8* 2.0*   No results for input(s): "LIPASE", "AMYLASE" in the last 168 hours. No results for input(s): "AMMONIA" in the last 168 hours. Coagulation Profile: No results for input(s): "INR", "PROTIME" in the last 168 hours. Cardiac Enzymes: No results for input(s): "CKTOTAL", "CKMB", "CKMBINDEX", "TROPONINI" in the last 168 hours. BNP (last 3 results) No results for input(s): "PROBNP" in the last 8760 hours. HbA1C: No results for input(s): "HGBA1C" in the last 72 hours. CBG: No results for input(s): "GLUCAP" in the last 168 hours. Lipid Profile: No results for input(s): "CHOL", "HDL", "LDLCALC", "TRIG", "CHOLHDL", "LDLDIRECT" in the last 72 hours. Thyroid Function Tests: No results for input(s): "TSH", "T4TOTAL", "FREET4", "T3FREE", "THYROIDAB" in the last 72 hours. Anemia Panel: Recent Labs    04/01/22 1357  FERRITIN 593*  TIBC 112*  IRON 40   Sepsis Labs: No results for input(s): "PROCALCITON", "LATICACIDVEN" in the last 168 hours.  Recent Results (from the past 240 hour(s))  Urine Culture     Status: Abnormal   Collection Time: 03/27/22  2:15 AM   Specimen: In/Out Cath Urine  Result Value Ref Range Status   Specimen Description   Final    IN/OUT CATH URINE Performed at [facility] [address], [place], [place] [address]    Special Requests   Final    NONE Performed at [facility], [place] [address], [place], [place] [address]    Culture MULTIPLE SPECIES PRESENT, SUGGEST RECOLLECTION (A)  Final   Report  Status 03/28/2022 FINAL  Final  Blood Culture (routine x 2)     Status: Abnormal   Collection Time: 03/27/22  3:14 AM   Specimen: BLOOD  Result Value Ref Range Status   Specimen Description   Final    BLOOD LEFT ANTECUBITAL Performed at [facility] [address], [place], [place] [address]    Special Requests   Final    BOTTLES DRAWN AEROBIC  AND ANAEROBIC Blood Culture adequate volume Performed at [facility] [address] [place], [place] [address]    Culture  Setup Time   Final    GRAM POSITIVE COCCI IN BOTH AEROBIC AND ANAEROBIC BOTTLES CRITICAL RESULT CALLED TO, READ BACK BY AND VERIFIED WITH: PHARMD MICHELLE LILLISTON 03/28/22@00 :31 BY TW Performed at [facility], [place] [address], [place], [place] [address]    Culture STAPHYLOCOCCUS HOMINIS (A)  Final   Report Status 03/30/2022 FINAL  Final   Organism ID, Bacteria STAPHYLOCOCCUS HOMINIS  Final      Susceptibility   Staphylococcus hominis - MIC*    CIPROFLOXACIN >=8 RESISTANT Resistant     ERYTHROMYCIN >=8 RESISTANT Resistant     GENTAMICIN <=0.5 SENSITIVE Sensitive     OXACILLIN 0.5 RESISTANT Resistant     TETRACYCLINE >=16 RESISTANT Resistant     VANCOMYCIN 1 SENSITIVE Sensitive     TRIMETH/SULFA 40 SENSITIVE Sensitive     CLINDAMYCIN RESISTANT Resistant     RIFAMPIN <=0.5 SENSITIVE Sensitive     Inducible Clindamycin POSITIVE Resistant     * STAPHYLOCOCCUS HOMINIS  Blood Culture ID Panel (Reflexed)     Status: Abnormal   Collection Time: 03/27/22  3:14 AM  Result Value Ref Range Status   Enterococcus faecalis NOT DETECTED NOT DETECTED Final   Enterococcus Faecium NOT DETECTED NOT DETECTED Final   Listeria monocytogenes NOT DETECTED NOT DETECTED Final   Staphylococcus species DETECTED (A) NOT DETECTED Final    Comment: CRITICAL RESULT CALLED TO, READ BACK BY AND VERIFIED WITH: PHARMD MICHELLE LILLISTON 03/28/22@00 :54 BY TW    Staphylococcus aureus (BCID) NOT DETECTED  NOT DETECTED Final   Staphylococcus epidermidis NOT DETECTED NOT DETECTED Final   Staphylococcus lugdunensis NOT DETECTED NOT DETECTED Final   Streptococcus species NOT DETECTED NOT DETECTED Final   Streptococcus agalactiae NOT DETECTED NOT DETECTED Final   Streptococcus pneumoniae NOT DETECTED NOT DETECTED Final   Streptococcus pyogenes NOT DETECTED NOT DETECTED Final   A.calcoaceticus-baumannii NOT DETECTED NOT DETECTED Final   Bacteroides fragilis NOT DETECTED NOT DETECTED Final   Enterobacterales NOT DETECTED NOT DETECTED Final   Enterobacter cloacae complex NOT DETECTED NOT DETECTED Final   Escherichia coli NOT DETECTED NOT DETECTED Final   Klebsiella aerogenes NOT DETECTED NOT DETECTED Final   Klebsiella oxytoca NOT DETECTED NOT DETECTED Final   Klebsiella pneumoniae NOT DETECTED NOT DETECTED Final   Proteus species NOT DETECTED NOT DETECTED Final   Salmonella species NOT DETECTED NOT DETECTED Final   Serratia marcescens NOT DETECTED NOT DETECTED Final   Haemophilus influenzae NOT DETECTED NOT DETECTED Final   Neisseria meningitidis NOT DETECTED NOT DETECTED Final   Pseudomonas aeruginosa NOT DETECTED NOT DETECTED Final   Stenotrophomonas maltophilia NOT DETECTED NOT DETECTED Final   Candida albicans NOT DETECTED NOT DETECTED Final   Candida auris NOT DETECTED NOT DETECTED Final   Candida glabrata NOT DETECTED NOT DETECTED Final   Candida krusei NOT DETECTED NOT DETECTED Final   Candida parapsilosis NOT DETECTED NOT DETECTED Final   Candida tropicalis NOT DETECTED NOT DETECTED Final   Cryptococcus neoformans/gattii NOT DETECTED NOT DETECTED Final    Comment: Performed at [facility], [place] [address] [place], [place] [address]  Blood Culture (routine x 2)     Status: Abnormal   Collection Time: 03/27/22  3:15 AM   Specimen: BLOOD RIGHT HAND  Result Value Ref Range Status   Specimen Description   Final    BLOOD RIGHT HAND  Performed at [facility],  [place] [address] [place], [place] [address]    Special Requests   Final    BOTTLES DRAWN AEROBIC ONLY Blood Culture adequate volume Performed at [facility] [address], [place], [place] [address]    Culture  Setup Time   Final    GRAM POSITIVE COCCI AEROBIC BOTTLE ONLY CRITICAL VALUE NOTED.  VALUE IS CONSISTENT WITH PREVIOUSLY REPORTED AND CALLED VALUE.    Culture (A)  Final    STAPHYLOCOCCUS AURICULARIS THE SIGNIFICANCE OF ISOLATING THIS ORGANISM FROM A SINGLE SET OF BLOOD CULTURES WHEN MULTIPLE SETS ARE DRAWN IS UNCERTAIN. PLEASE NOTIFY THE MICROBIOLOGY DEPARTMENT WITHIN ONE WEEK IF SPECIATION AND SENSITIVITIES ARE REQUIRED. Performed at [facility], [place] [address], [place], [place] [address]    Report Status 03/30/2022 FINAL  Final     Radiology Studies: No results found.  Scheduled Meds:  calcium acetate  667 mg Oral TID WC   carvedilol  6.25 mg Oral BID   darbepoetin (ARANESP) injection - NON-DIALYSIS  40 mcg Subcutaneous Q Mon-1800   furosemide  80 mg Intravenous BID   iron polysaccharides  150 mg Oral Daily   metolazone  5 mg Oral QAC breakfast   Continuous Infusions:  sodium chloride 10 mL/hr at 04/01/22 1546     LOS: 6 days   Darliss Cheney, MD [facility]  04/03/2022, 11:31 AM   *Please note that this is a verbal dictation therefore any spelling or grammatical errors are due to the "[place] [employer]" system interpretation.  Please page via [place] and do not message via secure chat for urgent patient care matters. Secure chat can be used for non urgent patient care matters.  How to contact the [facility] Attending or Consulting provider [place] or covering provider during after hours [place], for this patient?  Check the care team in [facility] and look for a) attending/consulting [facility] provider listed and b) the [facility] team listed. Page or secure chat [phone]. Log into [email or web address] and use [place]'s universal password to access. If you do  not have the password, please contact the hospital operator. Locate the [facility] provider you are looking for under [facility] and page to a number that you can be directly reached. If you still have difficulty reaching the provider, please page the [facility] (Director on Call) for the Hospitalists listed on amion for assistance.

## 2022-04-04 ENCOUNTER — Other Ambulatory Visit: Payer: Medicare (Managed Care) | Admitting: Internal Medicine

## 2022-04-04 DIAGNOSIS — N049 Nephrotic syndrome with unspecified morphologic changes: Secondary | ICD-10-CM | POA: Diagnosis not present

## 2022-04-04 LAB — PROTEIN ELECTROPHORESIS, SERUM
A/G Ratio: 0.7 (ref 0.7–1.7)
Albumin ELP: 1.9 g/dL — ABNORMAL LOW (ref 2.9–4.4)
Alpha-1-Globulin: 0.3 g/dL (ref 0.0–0.4)
Alpha-2-Globulin: 0.7 g/dL (ref 0.4–1.0)
Beta Globulin: 0.7 g/dL (ref 0.7–1.3)
Gamma Globulin: 1.2 g/dL (ref 0.4–1.8)
Globulin, Total: 2.8 g/dL (ref 2.2–3.9)
M-Spike, %: 0.1 g/dL — ABNORMAL HIGH
Total Protein ELP: 4.7 g/dL — ABNORMAL LOW (ref 6.0–8.5)

## 2022-04-04 LAB — RENAL FUNCTION PANEL
Albumin: 2 g/dL — ABNORMAL LOW (ref 3.5–5.0)
Anion gap: 5 (ref 5–15)
BUN: 62 mg/dL — ABNORMAL HIGH (ref 8–23)
CO2: 27 mmol/L (ref 22–32)
Calcium: 7.9 mg/dL — ABNORMAL LOW (ref 8.9–10.3)
Chloride: 113 mmol/L — ABNORMAL HIGH (ref 98–111)
Creatinine, Ser: 3.33 mg/dL — ABNORMAL HIGH (ref 0.44–1.00)
GFR, Estimated: 14 mL/min — ABNORMAL LOW (ref >60)
Glucose, Bld: 68 mg/dL — ABNORMAL LOW (ref 70–99)
Phosphorus: 6 mg/dL — ABNORMAL HIGH (ref 2.5–4.6)
Potassium: 3.8 mmol/L (ref 3.5–5.1)
Sodium: 145 mmol/L (ref 135–145)

## 2022-04-04 LAB — MISC LABCORP TEST (SEND OUT): Labcorp test code: 354928

## 2022-04-04 MED ORDER — LOSARTAN POTASSIUM 25 MG PO TABS
25.0000 mg | ORAL_TABLET | Freq: Every day | ORAL | Status: DC
  Administered 2022-04-04 – 2022-04-08 (×5): 25 mg via ORAL
  Filled 2022-04-04 (×4): qty 1

## 2022-04-04 MED ORDER — ALBUMIN HUMAN 25 % IV SOLN
25.0000 g | Freq: Once | INTRAVENOUS | Status: AC
  Administered 2022-04-04: 25 g via INTRAVENOUS
  Filled 2022-04-04: qty 100

## 2022-04-04 NOTE — Progress Notes (Signed)
[facility] Progress Note  Subjective:  she had 3 liters UOP over 6/21 charted.  She feels like she's losing weight - she can lift her leg up and before it was too heavy to do this. Concern over accuracy of some of the weights.  She has some compression devices at home that go up to the thighs.    Review of systems:    Denies n/v/d reports shortness of breath with exertion but this is getting better; no shortness of breath at rest; denies chest pain   ------------------ Supplemental hx:  She was ambulatory with a cane prior to March of this year.  She has had a foley in since her last hospitalization.  On chart review, she had a bone marrow biopsy on 5/31.  Cannot locate a note from hem/onc after that which references the results - labs weren't available at the time of their last note.  Her overall presentation was concerning for amyloid however note that the bone marrow biopsy wasn't specific or diagnostic of a plasma cell neoplasm per path report; she did have a small focus suggestive of amyloid but the congo red stain on deeper sectioning was negative for amyloid.   Vitals:   04/03/22 1836 04/03/22 2018 04/04/22 0534 04/04/22 1213  BP: (!) 175/91 (!) 149/79 (!) 173/81 (!) 173/94  Pulse: 68 67 65 60  Resp: _0 Temp:  97.9 F (36.6 C) 98.7 F (37.1 C) 98.8 F (37.1 C)  TempSrc:  Oral Oral Oral  SpO2: 100% 100% 99% 100%  Weight:   106 kg   Height:        Physical Exam:   General adult female in bed in no acute distress at rest HEENT normocephalic atraumatic extraocular movements intact sclera anicteric Neck supple trachea midline Lungs clear to auscultation bilaterally normal work of breathing at rest  Heart S1S2 no rub Abdomen soft nontender nondistended Extremities 2-3+ edema bilateral lower extremities Psych normal mood and affect Neuro and oriented x 3 provides hx and follows commands GU - foley catheter in place with urine     CXR - layering  bilat effusions and bb atx vs infiltrate      Home meds include - coreg bid, lasix 40 qd, mvi, sod bicarb, zinc, losartan 25 qd, aldactone 12.5 qd      UA > 300 protein, 21-50 rbc, > 50 wbc, many bacteria    UP/C ration - 18.8 (approx 58m proteinuria per 24hrs)   Assessment/ Plan: CKD IV - w/ anasarca, decompensated due to renal failure, hypoalbuminemia, diast CHF and severe nephrotic syndrome. UP/C ratio here is 18 gm/ d.  Pt on losartan at home and here 25 mg /d. Creat 3.0- 3.5 here now which is better than the last creat of 5 in march 2023. Admitted for worsening anasarca.  Felt that in the past she was not a candidate for dialysis.  She previously had AKI on CKD 3b - they felt could not rule out plasma cell dyscrasia.  Free light chains ratio 3.91.  Per chart review elevated free light chain ratio 02/2021.  Per 10/2021 renal [place] right hydro and moderate left hydro  Continue lasix 80 mg IV BID.  On metolazone daily.  She's overloaded but hypoalbuminemic.   Albumin once again today   Will need continued IV diuresis here but is making progress SPEP and UPEP pending.  Free light chains ratio elevated at 3.91.  Once had concern for amyloid but not found  on 2022 bone marrow biopsy path report Wrap legs with ACE bandages for compression - order placed Continue foley catheter (predates admission) Prognosis/ GOC - pt not felt to be a candidate for immunosuppression/ dialysis due to severe debility. Renal function thankfully better now than in march (creat mid 5's at that time) but overload. If her debility improves she could be considered for dialysis however she is bedbound currently.  HTN - add back losartan.  Optimize volume status  Chronic debility - bedbound per charting but this started in March per pt Pyuria - per pmd; multiple species present  PAF - per primary team Anemia of chronic disease/ ckd.  S/p PRBC's 1 unit on 6/20 to optimize diuresis.  Transfuse PRN. iron deficiency but sats ok  at 35%.  On PO iron.  Started Aranesp 40 mcg on 6/19 and ordered for every Monday   Metabolic bone disease and Hyperphosphatemia - intact PTH is 125. Defer calcitriol. Started phoslo.     Disposition - continue inpatient care for IV diuresis. Weights seem a little off but making progress  Recent Labs  Lab 04/02/22 0446 04/03/22 0529 04/04/22 0454  HGB 7.1* 8.2*  --   ALBUMIN 1.8* 2.0* 2.0*  CALCIUM 7.5* 7.7* 7.9*  PHOS 6.0* 6.1* 6.0*  CREATININE 3.25* 3.28* 3.33*  K 3.7 3.7 3.8   Inpatient medications:  calcium acetate  667 mg Oral TID WC   carvedilol  6.25 mg Oral BID   darbepoetin (ARANESP) injection - NON-DIALYSIS  40 mcg Subcutaneous Q Mon-1800   furosemide  80 mg Intravenous BID   iron polysaccharides  150 mg Oral Daily   metolazone  5 mg Oral QAC breakfast    sodium chloride 10 mL/hr at 04/01/22 1546    sodium chloride, acetaminophen **OR** acetaminophen, hydrALAZINE, Muscle Rub, mouth rinse   Claudia Desanctis, MD 04/04/2022 3:05 PM

## 2022-04-04 NOTE — Progress Notes (Signed)
PROGRESS NOTE    Kara Mullins  JJK:[id number] DOB: 1951/02/21 DOA: 03/27/2022 PCP: [name], NP   Brief Narrative:  71 y.o. female with medical history significant of paroxysmal atrial fibrillation, prolonged QT interval, hypothermia, chronic diastolic heart failure with her most recent EF of 55 to 60%, stage V CKD not on dialysis, hyperlipidemia, hypertension, COVID-19, generalized anxiety disorder, pancytopenia, history of urinary retention with chronic indwelling Foley catheter who was brought to the emergency department by her daughter due to elevated blood pressure measuring 220/150 mmHg at home due to the patient not taking her blood pressure medications.  Her daughter, gave her a tablet of carvedilol and brought her to the emergency department.  She has been basically bedbound at home, except for getting out of bed to a chair with assistance.  Her lower extremity edema has worsened considerably.  She and her daughter denied fever, chills, rhinorrhea, sore throat, wheezing or hemoptysis.  No chest pain, palpitations, diaphoresis, PND, orthopnea.  No abdominal pain, nausea, emesis, diarrhea, constipation, melena or hematochezia.  No flank pain, dysuria, frequency or hematuria.  No polyuria, polydipsia, polyphagia or blurred vision.    Pt found to have worsening renal function with volume overload. Nephrology consulted  Assessment & Plan:   Principal Problem:   Anasarca associated with disorder of kidney Active Problems:   Hypertensive urgency   Elevated troponin level not due myocardial infarction   Hypothermia   Paroxysmal atrial fibrillation (HCC)   CKD (chronic kidney disease) stage 5, GFR less than 15 ml/min (HCC)   Anemia of chronic kidney failure, stage 5 ([place])   Essential hypertension   Hyperlipidemia   Severe protein-calorie malnutrition (HCC)   Prolonged QT interval   Leukocytopenia   Aortic atherosclerosis ([place])   ARF (acute renal failure) (HCC)  Severe  protein-calorie malnutrition (HCC) Due to severe proteinuria and presenting as:   Anasarca associated with disorder of kidney In the setting of:   CKD (chronic kidney disease) stage 5, GFR less than 15 ml/min [facility]) -Nephrology consulted, recs to continue diuresis as tolerated -Continue IV lasix, dose decreased to 80mg  IV BID per Nephrology with metolazone.  Getting intermittent albumin.  Creatinine plateaued around 3.3. -Interestingly, patient is signed up as hospice, palliative Care is consulted. Full Code confirmed at time of presentation  Anemia of chronic disease: Baseline hemoglobin ranges anywhere from 7.5-9.Hemoglobin dropped to 6.5 on 03/28/2022, received 1 unit of PRBC transfusion.  Dropped again to 7.1 on 04/02/2022 and received another PRBC transfusion.  Monitor daily.  Leukocytopenia -Monitor WBC.   Hypothermia/UTI: -Resolved. -Completed course of rocephin for UTI   History of essential hypertension but presented with hypertensive urgency.  Blood pressure intermittently slightly elevated, continue on Coreg and Lasix.  Losartan on hold.    Elevated troponin level not due myocardial infarction Secondary to demand ischemia.     Prolonged QT interval -Check magnesium level. -Avoid QT prolonging meds.     Paroxysmal atrial fibrillation (HCC) -CHA?DS?-VASc Score of at least 5. (Age, female, HTN, diastolic CHF, aortic atherosclerosis). Followed by Cardiology Not on anticoagulation PTA Continue beta-blocker for rate control.   Hyperlipidemia/aortic atherosclerosis. -Has intolerance to statins.   Hypokalemia: Resolved.  DVT prophylaxis: SCDs Start: 03/27/22 0734   Code Status: Full Code  Family Communication:  None present at bedside.  Plan of care discussed with patient in length and he/she verbalized understanding and agreed with it.  Status is: Inpatient Remains inpatient appropriate because: Still with significant anasarca/edema requires IV diuresis.   Estimated  body  mass index is 38.89 kg/m as calculated from the following:   Height as of this encounter: 5\' 5"  (1.651 m).   Weight as of this encounter: 106 kg.  Pressure Injury 03/28/22 Sacrum Mid Stage 1 -  Intact skin with non-blanchable redness of a localized area usually over a bony prominence. Area is darker and non-blanchable. Intact, clean, dry. (Active)  03/28/22 0126  Location: Sacrum  Location Orientation: Mid  Staging: Stage 1 -  Intact skin with non-blanchable redness of a localized area usually over a bony prominence.  Wound Description (Comments): Area is darker and non-blanchable. Intact, clean, dry.  Present on Admission: Yes  Dressing Type Foam - Lift dressing to assess site every shift 04/02/22 2017   Nutritional Assessment: Body mass index is 38.89 kg/m.Marland Kitchen Seen by dietician.  I agree with the assessment and plan as outlined below: Nutrition Status:        . Skin Assessment: I have examined the patient's skin and I agree with the wound assessment as performed by the wound care RN as outlined below: Pressure Injury 03/28/22 Sacrum Mid Stage 1 -  Intact skin with non-blanchable redness of a localized area usually over a bony prominence. Area is darker and non-blanchable. Intact, clean, dry. (Active)  03/28/22 0126  Location: Sacrum  Location Orientation: Mid  Staging: Stage 1 -  Intact skin with non-blanchable redness of a localized area usually over a bony prominence.  Wound Description (Comments): Area is darker and non-blanchable. Intact, clean, dry.  Present on Admission: Yes  Dressing Type Foam - Lift dressing to assess site every shift 04/02/22 2017    Consultants:  Nephrology  Procedures:  None  Antimicrobials:  Anti-infectives (From admission, onward)    Start     Dose/Rate Route Frequency Ordered Stop   03/27/22 1200  cefTRIAXone (ROCEPHIN) 1 g in sodium chloride 0.9 % 100 mL IVPB  Status:  Discontinued        1 g 200 mL/hr over 30 Minutes Intravenous Every  24 hours 03/27/22 0941 03/30/22 1424         Subjective: Patient seen and examined.  She has no complaints.  Objective: Vitals:   04/03/22 1138 04/03/22 1836 04/03/22 2018 04/04/22 0534  BP: (!) 150/72 (!) 175/91 (!) 149/79 (!) 173/81  Pulse: 63 68 67 65  Resp: 16 16 16 18   Temp: 98.6 F (37 C)  97.9 F (36.6 C) 98.7 F (37.1 C)  TempSrc: Oral  Oral Oral  SpO2: 100% 100% 100% 99%  Weight:    106 kg  Height:        Intake/Output Summary (Last 24 hours) at 04/04/2022 1130 Last data filed at 04/04/2022 2694 Gross per 24 hour  Intake 1098.83 ml  Output 3085 ml  Net -1986.17 ml    Filed Weights   04/01/22 0500 04/02/22 1458 04/04/22 0534  Weight: 107.7 kg 105.4 kg 106 kg    Examination:  General exam: Appears calm and comfortable  Respiratory system: Clear to auscultation. Respiratory effort normal. Cardiovascular system: S1 & S2 heard, RRR. No JVD, murmurs, rubs, gallops or clicks.  +2-3 bilateral upper and lower extremity pitting edema Gastrointestinal system: Abdomen is nondistended, soft and nontender. No organomegaly or masses felt. Normal bowel sounds heard. Central nervous system: Alert and oriented. No focal neurological deficits. Extremities: Symmetric 5 x 5 power. Skin: No rashes, lesions or ulcers.  Psychiatry: Judgement and insight appear normal. Mood & affect appropriate.    Data Reviewed: I have personally  reviewed following labs and imaging studies  CBC: Recent Labs  Lab 03/28/22 1443 03/29/22 0430 04/02/22 0446 04/03/22 0529  WBC  --  3.7* 3.5* 3.9*  HGB 7.7* 7.7* 7.1* 8.2*  HCT 24.0* 24.2* 22.5* 25.6*  MCV  --  96.0 96.2 94.8  PLT  --  133* 127* 123*    Basic Metabolic Panel: Recent Labs  Lab 03/31/22 0529 04/01/22 0417 04/02/22 0446 04/03/22 0529 04/04/22 0454  NA 143 144 145 145 145  K 3.0* 3.4* 3.7 3.7 3.8  CL 112* 114* 113* 114* 113*  CO2 23 24 25 25 27   GLUCOSE 87 107* 81 88 68*  BUN 58* 60* 59* 59* 62*  CREATININE 3.34*  3.36* 3.25* 3.28* 3.33*  CALCIUM 7.2* 7.3* 7.5* 7.7* 7.9*  PHOS 6.3* 6.3* 6.0* 6.1* 6.0*    GFR: Estimated Creatinine Clearance: 19.3 mL/min (A) (by C-G formula based on SCr of 3.33 mg/dL (H)). Liver Function Tests: Recent Labs  Lab 03/29/22 0430 03/30/22 0507 03/31/22 0529 04/01/22 0417 04/02/22 0446 04/03/22 0529 04/04/22 0454  AST 27  --   --   --   --   --   --   ALT 16  --   --   --   --   --   --   ALKPHOS 117  --   --   --   --   --   --   BILITOT 0.4  --   --   --   --   --   --   PROT 5.1*  --   --   --   --   --   --   ALBUMIN 1.8*   < > 1.7* 1.6* 1.8* 2.0* 2.0*   < > = values in this interval not displayed.    No results for input(s): "LIPASE", "AMYLASE" in the last 168 hours. No results for input(s): "AMMONIA" in the last 168 hours. Coagulation Profile: No results for input(s): "INR", "PROTIME" in the last 168 hours. Cardiac Enzymes: No results for input(s): "CKTOTAL", "CKMB", "CKMBINDEX", "TROPONINI" in the last 168 hours. BNP (last 3 results) No results for input(s): "PROBNP" in the last 8760 hours. HbA1C: No results for input(s): "HGBA1C" in the last 72 hours. CBG: No results for input(s): "GLUCAP" in the last 168 hours. Lipid Profile: No results for input(s): "CHOL", "HDL", "LDLCALC", "TRIG", "CHOLHDL", "LDLDIRECT" in the last 72 hours. Thyroid Function Tests: No results for input(s): "TSH", "T4TOTAL", "FREET4", "T3FREE", "THYROIDAB" in the last 72 hours. Anemia Panel: Recent Labs    04/01/22 1357  FERRITIN 593*  TIBC 112*  IRON 40    Sepsis Labs: No results for input(s): "PROCALCITON", "LATICACIDVEN" in the last 168 hours.  Recent Results (from the past 240 hour(s))  Urine Culture     Status: Abnormal   Collection Time: 03/27/22  2:15 AM   Specimen: In/Out Cath Urine  Result Value Ref Range Status   Specimen Description   Final    IN/OUT CATH URINE Performed at [facility] [address], [place], [place] [address]     Special Requests   Final    NONE Performed at [facility], [place] [address] [place], [place] [address]    Culture MULTIPLE SPECIES PRESENT, SUGGEST RECOLLECTION (A)  Final   Report Status 03/28/2022 FINAL  Final  Blood Culture (routine x 2)     Status: Abnormal   Collection Time: 03/27/22  3:14 AM   Specimen: BLOOD  Result  Value Ref Range Status   Specimen Description   Final    BLOOD LEFT ANTECUBITAL Performed at [facility] [address] [place], [place] [address]    Special Requests   Final    BOTTLES DRAWN AEROBIC AND ANAEROBIC Blood Culture adequate volume Performed at [facility] [address] [place], [place] [address]    Culture  Setup Time   Final    GRAM POSITIVE COCCI IN BOTH AEROBIC AND ANAEROBIC BOTTLES CRITICAL RESULT CALLED TO, READ BACK BY AND VERIFIED WITH: PHARMD MICHELLE LILLISTON 03/28/22@00 :54 BY TW Performed at [facility], [place] [address] [place], [place] [address]    Culture STAPHYLOCOCCUS HOMINIS (A)  Final   Report Status 03/30/2022 FINAL  Final   Organism ID, Bacteria STAPHYLOCOCCUS HOMINIS  Final      Susceptibility   Staphylococcus hominis - MIC*    CIPROFLOXACIN >=8 RESISTANT Resistant     ERYTHROMYCIN >=8 RESISTANT Resistant     GENTAMICIN <=0.5 SENSITIVE Sensitive     OXACILLIN 0.5 RESISTANT Resistant     TETRACYCLINE >=16 RESISTANT Resistant     VANCOMYCIN 1 SENSITIVE Sensitive     TRIMETH/SULFA 40 SENSITIVE Sensitive     CLINDAMYCIN RESISTANT Resistant     RIFAMPIN <=0.5 SENSITIVE Sensitive     Inducible Clindamycin POSITIVE Resistant     * STAPHYLOCOCCUS HOMINIS  Blood Culture ID Panel (Reflexed)     Status: Abnormal   Collection Time: 03/27/22  3:14 AM  Result Value Ref Range Status   Enterococcus faecalis NOT DETECTED NOT DETECTED Final   Enterococcus Faecium NOT DETECTED NOT DETECTED Final   Listeria monocytogenes NOT DETECTED NOT DETECTED Final    Staphylococcus species DETECTED (A) NOT DETECTED Final    Comment: CRITICAL RESULT CALLED TO, READ BACK BY AND VERIFIED WITH: PHARMD MICHELLE LILLISTON 03/28/22@00 :54 BY TW    Staphylococcus aureus (BCID) NOT DETECTED NOT DETECTED Final   Staphylococcus epidermidis NOT DETECTED NOT DETECTED Final   Staphylococcus lugdunensis NOT DETECTED NOT DETECTED Final   Streptococcus species NOT DETECTED NOT DETECTED Final   Streptococcus agalactiae NOT DETECTED NOT DETECTED Final   Streptococcus pneumoniae NOT DETECTED NOT DETECTED Final   Streptococcus pyogenes NOT DETECTED NOT DETECTED Final   A.calcoaceticus-baumannii NOT DETECTED NOT DETECTED Final   Bacteroides fragilis NOT DETECTED NOT DETECTED Final   Enterobacterales NOT DETECTED NOT DETECTED Final   Enterobacter cloacae complex NOT DETECTED NOT DETECTED Final   Escherichia coli NOT DETECTED NOT DETECTED Final   Klebsiella aerogenes NOT DETECTED NOT DETECTED Final   Klebsiella oxytoca NOT DETECTED NOT DETECTED Final   Klebsiella pneumoniae NOT DETECTED NOT DETECTED Final   Proteus species NOT DETECTED NOT DETECTED Final   Salmonella species NOT DETECTED NOT DETECTED Final   Serratia marcescens NOT DETECTED NOT DETECTED Final   Haemophilus influenzae NOT DETECTED NOT DETECTED Final   Neisseria meningitidis NOT DETECTED NOT DETECTED Final   Pseudomonas aeruginosa NOT DETECTED NOT DETECTED Final   Stenotrophomonas maltophilia NOT DETECTED NOT DETECTED Final   Candida albicans NOT DETECTED NOT DETECTED Final   Candida auris NOT DETECTED NOT DETECTED Final   Candida glabrata NOT DETECTED NOT DETECTED Final   Candida krusei NOT DETECTED NOT DETECTED Final   Candida parapsilosis NOT DETECTED NOT DETECTED Final   Candida tropicalis NOT DETECTED NOT DETECTED Final   Cryptococcus neoformans/gattii NOT DETECTED NOT DETECTED Final    Comment: Performed at [facility], [place] [address] [place], [place] [address]  Blood Culture (routine x 2)      Status: Abnormal   Collection Time: 03/27/22  3:15 AM   Specimen: BLOOD RIGHT HAND  Result Value Ref Range Status   Specimen Description   Final    BLOOD RIGHT HAND Performed at [facility] [address] [place], [place] [address]    Special Requests   Final    BOTTLES DRAWN AEROBIC ONLY Blood Culture adequate volume Performed at [facility] [address], [place], [place] [address]    Culture  Setup Time   Final    GRAM POSITIVE COCCI AEROBIC BOTTLE ONLY CRITICAL VALUE NOTED.  VALUE IS CONSISTENT WITH PREVIOUSLY REPORTED AND CALLED VALUE.    Culture (A)  Final    STAPHYLOCOCCUS AURICULARIS THE SIGNIFICANCE OF ISOLATING THIS ORGANISM FROM A SINGLE SET OF BLOOD CULTURES WHEN MULTIPLE SETS ARE DRAWN IS UNCERTAIN. PLEASE NOTIFY THE MICROBIOLOGY DEPARTMENT WITHIN ONE WEEK IF SPECIATION AND SENSITIVITIES ARE REQUIRED. Performed at [facility], [place] [address], [place], [place] [address]    Report Status 03/30/2022 FINAL  Final     Radiology Studies: No results found.  Scheduled Meds:  calcium acetate  667 mg Oral TID WC   carvedilol  6.25 mg Oral BID   darbepoetin (ARANESP) injection - NON-DIALYSIS  40 mcg Subcutaneous Q Mon-1800   furosemide  80 mg Intravenous BID   iron polysaccharides  150 mg Oral Daily   metolazone  5 mg Oral QAC breakfast   Continuous Infusions:  sodium chloride 10 mL/hr at 04/01/22 1546     LOS: 7 days   Darliss Cheney, MD [facility]  04/04/2022, 11:30 AM   *Please note that this is a verbal dictation therefore any spelling or grammatical errors are due to the "[place] [employer]" system interpretation.  Please page via [place] and do not message via secure chat for urgent patient care matters. Secure chat can be used for non urgent patient care matters.  How to contact the [facility] Attending or Consulting provider [place] or covering provider during after hours [place], for this patient?  Check the  care team in [facility] and look for a) attending/consulting [facility] provider listed and b) the [facility] team listed. Page or secure chat [phone]. Log into [email or web address] and use [place]'s universal password to access. If you do not have the password, please contact the hospital operator. Locate the [facility] provider you are looking for under [facility] and page to a number that you can be directly reached. If you still have difficulty reaching the provider, please page the [facility] (Director on Call) for the Hospitalists listed on amion for assistance.

## 2022-04-05 DIAGNOSIS — N049 Nephrotic syndrome with unspecified morphologic changes: Secondary | ICD-10-CM | POA: Diagnosis not present

## 2022-04-05 LAB — RENAL FUNCTION PANEL
Albumin: 2.1 g/dL — ABNORMAL LOW (ref 3.5–5.0)
Anion gap: 6 (ref 5–15)
BUN: 61 mg/dL — ABNORMAL HIGH (ref 8–23)
CO2: 27 mmol/L (ref 22–32)
Calcium: 7.8 mg/dL — ABNORMAL LOW (ref 8.9–10.3)
Chloride: 111 mmol/L (ref 98–111)
Creatinine, Ser: 3.33 mg/dL — ABNORMAL HIGH (ref 0.44–1.00)
GFR, Estimated: 14 mL/min — ABNORMAL LOW (ref >60)
Glucose, Bld: 98 mg/dL (ref 70–99)
Phosphorus: 6.2 mg/dL — ABNORMAL HIGH (ref 2.5–4.6)
Potassium: 3.7 mmol/L (ref 3.5–5.1)
Sodium: 144 mmol/L (ref 135–145)

## 2022-04-05 MED ORDER — DARBEPOETIN ALFA 60 MCG/0.3ML IJ SOSY
60.0000 ug | PREFILLED_SYRINGE | INTRAMUSCULAR | Status: DC
  Administered 2022-04-08: 60 ug via SUBCUTANEOUS
  Filled 2022-04-05: qty 0.3

## 2022-04-05 MED ORDER — TORSEMIDE 20 MG PO TABS
40.0000 mg | ORAL_TABLET | Freq: Two times a day (BID) | ORAL | Status: DC
  Administered 2022-04-06 – 2022-04-08 (×6): 40 mg via ORAL
  Filled 2022-04-05 (×7): qty 2

## 2022-04-05 MED ORDER — CALCIUM ACETATE (PHOS BINDER) 667 MG PO CAPS
1334.0000 mg | ORAL_CAPSULE | Freq: Three times a day (TID) | ORAL | Status: DC
  Administered 2022-04-05 – 2022-04-08 (×10): 1334 mg via ORAL
  Filled 2022-04-05 (×11): qty 2

## 2022-04-05 MED ORDER — FUROSEMIDE 10 MG/ML IJ SOLN
80.0000 mg | Freq: Once | INTRAMUSCULAR | Status: AC
Start: 2022-04-05 — End: 2022-04-05
  Administered 2022-04-05: 80 mg via INTRAVENOUS
  Filled 2022-04-05: qty 8

## 2022-04-06 DIAGNOSIS — N049 Nephrotic syndrome with unspecified morphologic changes: Secondary | ICD-10-CM | POA: Diagnosis not present

## 2022-04-06 LAB — RENAL FUNCTION PANEL
Albumin: 1.9 g/dL — ABNORMAL LOW (ref 3.5–5.0)
Anion gap: 7 (ref 5–15)
BUN: 59 mg/dL — ABNORMAL HIGH (ref 8–23)
CO2: 27 mmol/L (ref 22–32)
Calcium: 7.9 mg/dL — ABNORMAL LOW (ref 8.9–10.3)
Chloride: 108 mmol/L (ref 98–111)
Creatinine, Ser: 3.27 mg/dL — ABNORMAL HIGH (ref 0.44–1.00)
GFR, Estimated: 15 mL/min — ABNORMAL LOW (ref >60)
Glucose, Bld: 90 mg/dL (ref 70–99)
Phosphorus: 5.7 mg/dL — ABNORMAL HIGH (ref 2.5–4.6)
Potassium: 3.7 mmol/L (ref 3.5–5.1)
Sodium: 142 mmol/L (ref 135–145)

## 2022-04-06 LAB — CBC
HCT: 27.1 % — ABNORMAL LOW (ref 36.0–46.0)
Hemoglobin: 8.7 g/dL — ABNORMAL LOW (ref 12.0–15.0)
MCH: 30.3 pg (ref 26.0–34.0)
MCHC: 32.1 g/dL (ref 30.0–36.0)
MCV: 94.4 fL (ref 80.0–100.0)
Platelets: 150 10*3/uL (ref 150–400)
RBC: 2.87 MIL/uL — ABNORMAL LOW (ref 3.87–5.11)
RDW: 14.1 % (ref 11.5–15.5)
WBC: 4.5 10*3/uL (ref 4.0–10.5)
nRBC: 0 % (ref 0.0–0.2)

## 2022-04-06 NOTE — Progress Notes (Signed)
PROGRESS NOTE    Kara Mullins  ZOX:[id number] DOB: December 17, 1950 DOA: 03/27/2022 PCP: Caesar Bookman, NP   Brief Narrative:  71 y.o. female with medical history significant of paroxysmal atrial fibrillation, prolonged QT interval, hypothermia, chronic diastolic heart failure with her most recent EF of 55 to 60%, stage V CKD not on dialysis, hyperlipidemia, hypertension, COVID-19, generalized anxiety disorder, pancytopenia, history of urinary retention with chronic indwelling Foley catheter who was brought to the emergency department by her daughter due to elevated blood pressure measuring 220/150 mmHg at home due to the patient not taking her blood pressure medications.  Her daughter, gave her a tablet of carvedilol and brought her to the emergency department.  She has been basically bedbound at home, except for getting out of bed to a chair with assistance.  Her lower extremity edema has worsened considerably.  She and her daughter denied fever, chills, rhinorrhea, sore throat, wheezing or hemoptysis.  No chest pain, palpitations, diaphoresis, PND, orthopnea.  No abdominal pain, nausea, emesis, diarrhea, constipation, melena or hematochezia.  No flank pain, dysuria, frequency or hematuria.  No polyuria, polydipsia, polyphagia or blurred vision.    Pt found to have worsening renal function with volume overload. Nephrology consulted  Assessment & Plan:   Principal Problem:   Anasarca associated with disorder of kidney Active Problems:   Hypertensive urgency   Elevated troponin level not due myocardial infarction   Hypothermia   Paroxysmal atrial fibrillation (HCC)   CKD (chronic kidney disease) stage 5, GFR less than 15 ml/min (HCC)   Anemia of chronic kidney failure, stage 5 (HCC)   Essential hypertension   Hyperlipidemia   Severe protein-calorie malnutrition (HCC)   Prolonged QT interval   Leukocytopenia   Aortic atherosclerosis (HCC)   ARF (acute renal failure) (HCC)  Severe  protein-calorie malnutrition (HCC) Due to severe proteinuria and presenting as:   Anasarca associated with disorder of kidney In the setting of:   CKD (chronic kidney disease) stage 5, GFR less than 15 ml/min [facility]) -Nephrology consulted, transitioned to torsemide today.  Plan is to watch her on torsemide, repeat renal function tomorrow morning, if all okay, plan for discharge tomorrow.  PT OT also consulted.  Anemia of chronic disease: Baseline hemoglobin ranges anywhere from 7.5-9.Hemoglobin dropped to 6.5 on 03/28/2022, received 1 unit of PRBC transfusion.  Dropped again to 7.1 on 04/02/2022 and received another PRBC transfusion.  Hemoglobin 8.7.  Monitor periodically.  Leukocytopenia -Monitor WBC.  Improved.   Hypothermia/UTI: -Resolved. -Completed course of rocephin for UTI   History of essential hypertension but presented with hypertensive urgency.  Blood pressure intermittently slightly elevated, continue on Coreg and Lasix.  Losartan resumed by nephrology on 04/04/2022.    Elevated troponin level not due myocardial infarction Secondary to demand ischemia.     Prolonged QT interval -Check magnesium level. -Avoid QT prolonging meds.     Paroxysmal atrial fibrillation (HCC) -CHA?DS?-VASc Score of at least 5. (Age, female, HTN, diastolic CHF, aortic atherosclerosis). Followed by Cardiology Not on anticoagulation PTA Continue beta-blocker for rate control.   Hyperlipidemia/aortic atherosclerosis. -Has intolerance to statins.   Hypokalemia: Resolved.  DVT prophylaxis: SCDs Start: 03/27/22 0734   Code Status: Full Code  Family Communication:  None present at bedside.  Plan of care discussed with patient in length and he/she verbalized understanding and agreed with it.  Status is: Inpatient Remains inpatient appropriate because: transitioned on oral diuretics, need observation other 24 hours   Estimated body mass index is 37.82 kg/m as  calculated from the following:   Height  as of this encounter: 5\' 5"  (1.651 m).   Weight as of this encounter: 103.1 kg.  Pressure Injury 03/28/22 Sacrum Mid Stage 1 -  Intact skin with non-blanchable redness of a localized area usually over a bony prominence. Area is darker and non-blanchable. Intact, clean, dry. (Active)  03/28/22 0126  Location: Sacrum  Location Orientation: Mid  Staging: Stage 1 -  Intact skin with non-blanchable redness of a localized area usually over a bony prominence.  Wound Description (Comments): Area is darker and non-blanchable. Intact, clean, dry.  Present on Admission: Yes  Dressing Type Foam - Lift dressing to assess site every shift 04/05/22 2055   Nutritional Assessment: Body mass index is 37.82 kg/m.Marland Kitchen Seen by dietician.  I agree with the assessment and plan as outlined below: Nutrition Status:        . Skin Assessment: I have examined the patient's skin and I agree with the wound assessment as performed by the wound care RN as outlined below: Pressure Injury 03/28/22 Sacrum Mid Stage 1 -  Intact skin with non-blanchable redness of a localized area usually over a bony prominence. Area is darker and non-blanchable. Intact, clean, dry. (Active)  03/28/22 0126  Location: Sacrum  Location Orientation: Mid  Staging: Stage 1 -  Intact skin with non-blanchable redness of a localized area usually over a bony prominence.  Wound Description (Comments): Area is darker and non-blanchable. Intact, clean, dry.  Present on Admission: Yes  Dressing Type Foam - Lift dressing to assess site every shift 04/05/22 2055    Consultants:  Nephrology  Procedures:  None  Antimicrobials:  Anti-infectives (From admission, onward)    Start     Dose/Rate Route Frequency Ordered Stop   03/27/22 1200  cefTRIAXone (ROCEPHIN) 1 g in sodium chloride 0.9 % 100 mL IVPB  Status:  Discontinued        1 g 200 mL/hr over 30 Minutes Intravenous Every 24 hours 03/27/22 0941 03/30/22 1424          Subjective:  Patient seen and examined.  She has no complaints.  Objective: Vitals:   04/05/22 1203 04/05/22 1921 04/06/22 0256 04/06/22 0257  BP: (!) 172/84 (!) 155/77 (!) 169/85   Pulse: (!) 57 (!) 53 (!) 53   Resp: 18 18 20    Temp: (!) 97.3 F (36.3 C) 97.8 F (36.6 C)    TempSrc: Oral Oral    SpO2: 100% 99% 100%   Weight:    103.1 kg  Height:        Intake/Output Summary (Last 24 hours) at 04/06/2022 1048 Last data filed at 04/06/2022 1035 Gross per 24 hour  Intake 400 ml  Output 2851 ml  Net -2451 ml    Filed Weights   04/04/22 0534 04/05/22 0541 04/06/22 0257  Weight: 106 kg 104.2 kg 103.1 kg    Examination:  General exam: Appears calm and comfortable  Respiratory system: Clear to auscultation. Respiratory effort normal. Cardiovascular system: S1 & S2 heard, RRR. No JVD, murmurs, rubs, gallops or clicks.  +1 pitting edema bilateral lower extremity. Gastrointestinal system: Abdomen is nondistended, soft and nontender. No organomegaly or masses felt. Normal bowel sounds heard. Central nervous system: Alert and oriented. No focal neurological deficits. Extremities: Symmetric 5 x 5 power. Skin: No rashes, lesions or ulcers.  Psychiatry: Judgement and insight appear normal. Mood & affect appropriate.   Data Reviewed: I have personally reviewed following labs and imaging studies  CBC: Recent  Labs  Lab 04/02/22 0446 04/03/22 0529 04/06/22 0527  WBC 3.5* 3.9* 4.5  HGB 7.1* 8.2* 8.7*  HCT 22.5* 25.6* 27.1*  MCV 96.2 94.8 94.4  PLT 127* 123* 150    Basic Metabolic Panel: Recent Labs  Lab 04/02/22 0446 04/03/22 0529 04/04/22 0454 04/05/22 0521 04/06/22 0615  NA 145 145 145 144 142  K 3.7 3.7 3.8 3.7 3.7  CL 113* 114* 113* 111 108  CO2 25 25 27 27 27   GLUCOSE 81 88 68* 98 90  BUN 59* 59* 62* 61* 59*  CREATININE 3.25* 3.28* 3.33* 3.33* 3.27*  CALCIUM 7.5* 7.7* 7.9* 7.8* 7.9*  PHOS 6.0* 6.1* 6.0* 6.2* 5.7*    GFR: Estimated Creatinine  Clearance: 19.3 mL/min (A) (by C-G formula based on SCr of 3.27 mg/dL (H)). Liver Function Tests: Recent Labs  Lab 04/02/22 0446 04/03/22 0529 04/04/22 0454 04/05/22 0521 04/06/22 0615  ALBUMIN 1.8* 2.0* 2.0* 2.1* 1.9*    No results for input(s): "LIPASE", "AMYLASE" in the last 168 hours. No results for input(s): "AMMONIA" in the last 168 hours. Coagulation Profile: No results for input(s): "INR", "PROTIME" in the last 168 hours. Cardiac Enzymes: No results for input(s): "CKTOTAL", "CKMB", "CKMBINDEX", "TROPONINI" in the last 168 hours. BNP (last 3 results) No results for input(s): "PROBNP" in the last 8760 hours. HbA1C: No results for input(s): "HGBA1C" in the last 72 hours. CBG: No results for input(s): "GLUCAP" in the last 168 hours. Lipid Profile: No results for input(s): "CHOL", "HDL", "LDLCALC", "TRIG", "CHOLHDL", "LDLDIRECT" in the last 72 hours. Thyroid Function Tests: No results for input(s): "TSH", "T4TOTAL", "FREET4", "T3FREE", "THYROIDAB" in the last 72 hours. Anemia Panel: No results for input(s): "VITAMINB12", "FOLATE", "FERRITIN", "TIBC", "IRON", "RETICCTPCT" in the last 72 hours.  Sepsis Labs: No results for input(s): "PROCALCITON", "LATICACIDVEN" in the last 168 hours.  No results found for this or any previous visit (from the past 240 hour(s)).    Radiology Studies: No results found.  Scheduled Meds:  calcium acetate  1,334 mg Oral TID WC   carvedilol  6.25 mg Oral BID   [START ON 04/08/2022] darbepoetin (ARANESP) injection - NON-DIALYSIS  60 mcg Subcutaneous Q Mon-1800   iron polysaccharides  150 mg Oral Daily   losartan  25 mg Oral Daily   torsemide  40 mg Oral BID   Continuous Infusions:  sodium chloride 10 mL/hr at 04/01/22 1546     LOS: 9 days   Hughie Closs, MD [facility]  04/06/2022, 10:48 AM   *Please note that this is a verbal dictation therefore any spelling or grammatical errors are due to the "[employer]" system  interpretation.  Please page via Amion and do not message via secure chat for urgent patient care matters. Secure chat can be used for non urgent patient care matters.  How to contact the [facility] Attending or Consulting provider 7A - 7P or covering provider during after hours 7P -7A, for this patient?  Check the care team in [facility] and look for a) attending/consulting [facility] provider listed and b) the [facility] team listed. Page or secure chat 7A-7P. Log into [email or web address] and use [place]'s universal password to access. If you do not have the password, please contact the hospital operator. Locate the [facility] provider you are looking for under [facility] and page to a number that you can be directly reached. If you still have difficulty reaching the provider, please page the [facility] (Director on Call) for the Hospitalists listed on amion for  assistance.

## 2022-04-06 NOTE — Evaluation (Addendum)
Physical Therapy Evaluation Patient Details Name: Kara Mullins MRN: [id number] DOB: July 21, 1951 Today's Date: 04/06/2022  History of Present Illness  71 y.o. female with medical history significant of paroxysmal atrial fibrillation, prolonged QT interval, hypothermia, chronic diastolic heart failure with her most recent EF of 55 to 60%, stage V CKD not on dialysis, hyperlipidemia, hypertension, COVID-19, generalized anxiety disorder, pancytopenia, history of urinary retention with chronic indwelling Foley catheter who was brought to the emergency department by her daughter due to elevated blood pressure measuring 220/150 mmHg at home due to the patient not taking her blood pressure medications.  Dx of anasarca associated with disorder of kidney.  Clinical Impression  Pt admitted with above diagnosis. +2 mod assist for supine to sit, +2 max assist for sit to stand with RW, pt unable to come to full upright standing position with RW. Then attempted sit to stand with Stedy, pt was able to stand for ~20 seconds, and then sat on flaps of Stedy for 2-3 minutes, she then became dizzy so was assisted back to bed. Pt performed BUE/LE exercises for strengthening. She puts forth good effort.  Pt currently with functional limitations due to the deficits listed below (see PT Problem List). Pt will benefit from skilled PT to increase their independence and safety with mobility to allow discharge to the venue listed below.          Recommendations for follow up therapy are one component of a multi-disciplinary discharge planning process, led by the attending physician.  Recommendations may be updated based on patient status, additional functional criteria and insurance authorization.  Follow Up Recommendations Acute inpatient rehab (3hours/day) - HHPT if not accepted at [facility]. Pt/daughter decline SNF due to bad experience there recently.      Assistance Recommended at Discharge Frequent or constant  Supervision/Assistance  Patient can return home with the following  Two people to help with walking and/or transfers;Assist for transportation;Assistance with cooking/housework;A lot of help with bathing/dressing/bathroom    Equipment Recommendations Other (comment) Michiel Sites lift if DC home)  Recommendations for Other Services       Functional Status Assessment Patient has had a recent decline in their functional status and demonstrates the ability to make significant improvements in function in a reasonable and predictable amount of time.     Precautions / Restrictions Precautions Precautions: Fall Restrictions Weight Bearing Restrictions: No      Mobility  Bed Mobility Overal bed mobility: Needs Assistance Bed Mobility: Supine to Sit, Sit to Supine     Supine to sit: Mod assist, +2 for physical assistance Sit to supine: Total assist, +2 for physical assistance   General bed mobility comments: assist to raise trunk and pivot hips to EOB    Transfers Overall transfer level: Needs assistance Equipment used: Rolling walker (2 wheels) Transfers: Sit to/from Stand Sit to Stand: Max assist, +2 physical assistance, From elevated surface           General transfer comment: sit to stand x 2 with RW with max A to power up (difficult to come to full upright stand, pt with trunk flexed, B knees with limited flexion so not able to get feet under her well in set up position prior to standing); then sit to stand in [place] with improved ability to come to upright position, pt stood in [place] for ~20 seconds then sat on seat flaps for 2-3 minutes, she then became dizzy so she was assisted back to bed Transfer via Lift Equipment: Stedy  Ambulation/Gait  Stairs            Wheelchair Mobility    Modified Rankin (Stroke Patients Only)       Balance Overall balance assessment: Needs assistance Sitting-balance support: Feet supported, Single extremity  supported Sitting balance-Leahy Scale: Poor Sitting balance - Comments: posterior lean when pt didn't have BUE support, can maintain trunk upright with BUE support Postural control: Posterior lean Standing balance support: Bilateral upper extremity supported Standing balance-Leahy Scale: Poor                               Pertinent Vitals/Pain Pain Assessment Pain Assessment: No/denies pain    Home Living Family/patient expects to be discharged to:: Private residence Living Arrangements: Children Available Help at Discharge: Family;Available PRN/intermittently Type of Home: House Home Access: Ramped entrance       Home Layout: One level Home Equipment: Rollator (4 wheels);Wheelchair - manual;BSC/3in1      Prior Function Prior Level of Function : Needs assist             Mobility Comments: prior to illness was walking short distances with rollator, prior to this admission she was transferring with assistance to a WC ADLs Comments: assist needed     Hand Dominance        Extremity/Trunk Assessment   Upper Extremity Assessment Upper Extremity Assessment: Defer to OT evaluation    Lower Extremity Assessment Lower Extremity Assessment: RLE deficits/detail;LLE deficits/detail;Generalized weakness RLE Deficits / Details: knee flexion ~80*, knee ext 4/5 RLE Sensation: WNL LLE Deficits / Details: knee flexion ~90*, knee ext +4/5 LLE Sensation: WNL    Cervical / Trunk Assessment Cervical / Trunk Assessment: Kyphotic  Communication   Communication: No difficulties  Cognition Arousal/Alertness: Awake/alert Behavior During Therapy: WFL for tasks assessed/performed Overall Cognitive Status: Within Functional Limits for tasks assessed                                          General Comments      Exercises General Exercises - Upper Extremity Shoulder Flexion: AAROM, Both, 10 reps, Seated General Exercises - Lower Extremity Long Arc  Quad: AROM, Both, 15 reps, Seated   Assessment/Plan    PT Assessment Patient needs continued PT services  PT Problem List Decreased mobility;Decreased activity tolerance;Decreased balance;Decreased range of motion;Decreased strength       PT Treatment Interventions Therapeutic exercise;Therapeutic activities;Functional mobility training;Patient/family education;Gait training;DME instruction    PT Goals (Current goals can be found in the Care Plan section)  Acute Rehab PT Goals Patient Stated Goal: to be able to walk PT Goal Formulation: With patient/family Time For Goal Achievement: 04/20/22 Potential to Achieve Goals: Good    Frequency Min 3X/week     Co-evaluation               AM-PAC PT "6 Clicks" Mobility  Outcome Measure Help needed turning from your back to your side while in a flat bed without using bedrails?: A Lot Help needed moving from lying on your back to sitting on the side of a flat bed without using bedrails?: Total Help needed moving to and from a bed to a chair (including a wheelchair)?: Total Help needed standing up from a chair using your arms (e.g., wheelchair or bedside chair)?: Total Help needed to walk in hospital room?: Total Help needed climbing 3-5  steps with a railing? : Total 6 Click Score: 7    End of Session Equipment Utilized During Treatment: Gait belt Activity Tolerance: Patient tolerated treatment well Patient left: in bed;with call bell/phone within reach;with family/visitor present Nurse Communication: Mobility status;Need for lift equipment PT Visit Diagnosis: Difficulty in walking, not elsewhere classified (R26.2);Other abnormalities of gait and mobility (R26.89)    Time: 1610-9604 PT Time Calculation (min) (ACUTE ONLY): 47 min   Charges:   PT Evaluation $PT Eval Moderate Complexity: 1 Mod PT Treatments $Therapeutic Activity: 23-37 mins        Ralene Bathe Kistler PT 04/06/2022  Acute Rehabilitation  Services  Office [phone]

## 2022-04-06 NOTE — Progress Notes (Signed)
Pt rectal temp 94.5 after warm blankets.  Pt remains A/O and other vitals stable.  MD, charge nurse, and rapid notified.  Orders given for transfer to step down unit for warming blanket (barehugger).   Will continue to monitor.

## 2022-04-07 DIAGNOSIS — N049 Nephrotic syndrome with unspecified morphologic changes: Secondary | ICD-10-CM | POA: Diagnosis not present

## 2022-04-07 LAB — RENAL FUNCTION PANEL
Albumin: 1.8 g/dL — ABNORMAL LOW (ref 3.5–5.0)
Anion gap: 6 (ref 5–15)
BUN: 62 mg/dL — ABNORMAL HIGH (ref 8–23)
CO2: 28 mmol/L (ref 22–32)
Calcium: 7.9 mg/dL — ABNORMAL LOW (ref 8.9–10.3)
Chloride: 109 mmol/L (ref 98–111)
Creatinine, Ser: 3.47 mg/dL — ABNORMAL HIGH (ref 0.44–1.00)
GFR, Estimated: 14 mL/min — ABNORMAL LOW (ref >60)
Glucose, Bld: 76 mg/dL (ref 70–99)
Phosphorus: 5.5 mg/dL — ABNORMAL HIGH (ref 2.5–4.6)
Potassium: 3.7 mmol/L (ref 3.5–5.1)
Sodium: 143 mmol/L (ref 135–145)

## 2022-04-07 MED ORDER — HEPARIN SODIUM (PORCINE) 5000 UNIT/ML IJ SOLN
5000.0000 [IU] | Freq: Three times a day (TID) | INTRAMUSCULAR | Status: DC
  Administered 2022-04-07 – 2022-04-08 (×4): 5000 [IU] via SUBCUTANEOUS
  Filled 2022-04-07 (×4): qty 1

## 2022-04-07 MED ORDER — POTASSIUM CHLORIDE CRYS ER 20 MEQ PO TBCR
30.0000 meq | EXTENDED_RELEASE_TABLET | Freq: Once | ORAL | Status: AC
Start: 2022-04-07 — End: 2022-04-07
  Administered 2022-04-07: 30 meq via ORAL
  Filled 2022-04-07: qty 1

## 2022-04-07 NOTE — Evaluation (Addendum)
Occupational Therapy Evaluation Patient Details Name: Kara Mullins MRN: [id number] DOB: [birth year] Today's Date: 04/07/2022   History of Present Illness 71 y.o. female with medical history significant of paroxysmal atrial fibrillation, prolonged QT interval, hypothermia, chronic diastolic heart failure with her most recent EF of 55 to 60%, stage V CKD not on dialysis, hyperlipidemia, hypertension, COVID-19, generalized anxiety disorder, pancytopenia, history of urinary retention with chronic indwelling Foley catheter who was brought to the emergency department by her daughter due to elevated blood pressure measuring 220/150 mmHg at home due to the patient not taking her blood pressure medications.  Dx of anasarca associated with disorder of kidney.   Clinical Impression   Patient is a 71 year old female who presented for above. Patient was noted to have had a continued decline from PLOF impacting participation in ADLs. Patient plans to transition home with family support and HH services at time of d/c. Patient ws able to participate in scooting up to head of bed seated with min A with continued education on proper positioning. Patient would continue to benefit from skilled OT services at this time while admitted and after d/c to address noted deficits in order to improve overall safety and independence in ADLs.  Blood pressures: Lying in bed: 144/73 mmhg Sitting EOB 157/73 mmhg After scoot transfers: 105/23mmhg with patient reporting dizziness        Recommendations for follow up therapy are one component of a multi-disciplinary discharge planning process, led by the attending physician.  Recommendations may be updated based on patient status, additional functional criteria and insurance authorization.   Follow Up Recommendations  Home health OT    Assistance Recommended at Discharge Frequent or constant Supervision/Assistance  Patient can return home with the following Two people to help  with walking and/or transfers;Two people to help with bathing/dressing/bathroom;Direct supervision/assist for medications management;Direct supervision/assist for financial management;Assistance with cooking/housework;Help with stairs or ramp for entrance    Functional Status Assessment  Patient has had a recent decline in their functional status and demonstrates the ability to make significant improvements in function in a reasonable and predictable amount of time.  Equipment Recommendations  Other (comment) (total lift)    Recommendations for Other Services       Precautions / Restrictions Precautions Precautions: Fall Precaution Comments: monitor BP Restrictions Weight Bearing Restrictions: No      Mobility Bed Mobility Overal bed mobility: Needs Assistance Bed Mobility: Supine to Sit, Sit to Supine     Supine to sit: Mod assist, HOB elevated Sit to supine: Mod assist   General bed mobility comments: with increased time to get BLE to edge of bed. patient was max A for getting BLE back into bed with attempts to use leg lifter gait belt with education to daughter in room.    Transfers                          Balance Overall balance assessment: Needs assistance Sitting-balance support: Feet supported, Single extremity supported Sitting balance-Leahy Scale: Fair                                     ADL either performed or assessed with clinical judgement   ADL Overall ADL's : Needs assistance/impaired Eating/Feeding: Set up;Sitting   Grooming: Set up;Sitting   Upper Body Bathing: Set up;Sitting   Lower Body Bathing: Moderate assistance;Sitting/lateral leans;Bed level  Upper Body Dressing : Set up;Sitting   Lower Body Dressing: Moderate assistance;Sitting/lateral leans;Bed level     Toilet Transfer Details (indicate cue type and reason): patient attempted scoot transfers x1 with patient unable to complete scoot transfer to recliner in  room. patient was min A to scoot up sitting upright on edge of bed with increased time and cues. patient and daughter were educated on how to practice scoot transfers and recommended carryover with [facility] therapy. patient and daughter verbalized understanding. Toileting- Clothing Manipulation and Hygiene: Total assistance;Bed level       Functional mobility during ADLs: +2 for safety/equipment;+2 for physical assistance       Vision Patient Visual Report: No change from baseline       Perception     Praxis      Pertinent Vitals/Pain Pain Assessment Pain Assessment: Faces Faces Pain Scale: Hurts a little bit Pain Location: RLE Pain Descriptors / Indicators: Sore Pain Intervention(s): Monitored during session     Hand Dominance Right   Extremity/Trunk Assessment Upper Extremity Assessment Upper Extremity Assessment: Generalized weakness (patient was noted to have large collection of fluid posterior upper arm/elbow area bilaterally.)   Lower Extremity Assessment Lower Extremity Assessment: Defer to PT evaluation   Cervical / Trunk Assessment Cervical / Trunk Assessment: Kyphotic   Communication Communication Communication: No difficulties   Cognition Arousal/Alertness: Awake/alert Behavior During Therapy: WFL for tasks assessed/performed Overall Cognitive Status: Within Functional Limits for tasks assessed                                 General Comments: patient was eager to participate in session. daughter was present in room     General Comments       Exercises     Shoulder Instructions      Home Living Family/patient expects to be discharged to:: Private residence Living Arrangements: Children Available Help at Discharge: Family;Available PRN/intermittently Type of Home: House Home Access: Ramped entrance     Home Layout: One level         Bathroom Toilet: Handicapped height     Home Equipment: Rollator (4 wheels);Wheelchair -  manual;BSC/3in1   Additional Comments: was sponge bathing, lives with daughter who works      Prior Functioning/Environment Prior Level of Function : Needs assist             Mobility Comments: prior to illness was walking short distances with rollator, prior to this admission she was transferring with assistance to a WC ADLs Comments: daughter assisted with bathing for LB and dressing as needed.        OT Problem List: Decreased activity tolerance;Impaired balance (sitting and/or standing);Decreased safety awareness;Decreased knowledge of precautions;Decreased knowledge of use of DME or AE;Cardiopulmonary status limiting activity      OT Treatment/Interventions: Self-care/ADL training;Therapeutic exercise;Neuromuscular education;Energy conservation;DME and/or AE instruction;Therapeutic activities;Balance training;Patient/family education    OT Goals(Current goals can be found in the care plan section) Acute Rehab OT Goals Patient Stated Goal: to get better OT Goal Formulation: With patient/family Time For Goal Achievement: 04/21/22 Potential to Achieve Goals: Fair  OT Frequency: Min 2X/week    Co-evaluation              AM-PAC OT "6 Clicks" Daily Activity     Outcome Measure Help from another person eating meals?: A Little Help from another person taking care of personal grooming?: A Little Help from another person toileting, which  includes using toliet, bedpan, or urinal?: Total Help from another person bathing (including washing, rinsing, drying)?: A Lot Help from another person to put on and taking off regular upper body clothing?: A Little Help from another person to put on and taking off regular lower body clothing?: A Lot 6 Click Score: 14   End of Session Equipment Utilized During Treatment: Gait belt Nurse Communication: Mobility status  Activity Tolerance: Patient tolerated treatment well Patient left: in bed;with call bell/phone within reach;with  nursing/sitter in room;with bed alarm set;with family/visitor present  OT Visit Diagnosis: Unsteadiness on feet (R26.81);Muscle weakness (generalized) (M62.81)                Time: 1610-9604 OT Time Calculation (min): 39 min Charges:  OT General Charges $OT Visit: 1 Visit OT Evaluation $OT Eval Moderate Complexity: 1 Mod OT Treatments $Self Care/Home Management : 23-37 mins  Sharyn Blitz OTR/L, MS Acute Rehabilitation Department Office# [phone] Pager# [phone]   Ardyth Harps 04/07/2022, 1:12 PM

## 2022-04-08 DIAGNOSIS — N049 Nephrotic syndrome with unspecified morphologic changes: Secondary | ICD-10-CM | POA: Diagnosis not present

## 2022-04-08 LAB — RENAL FUNCTION PANEL
Albumin: 1.7 g/dL — ABNORMAL LOW (ref 3.5–5.0)
Anion gap: 5 (ref 5–15)
BUN: 64 mg/dL — ABNORMAL HIGH (ref 8–23)
CO2: 29 mmol/L (ref 22–32)
Calcium: 7.6 mg/dL — ABNORMAL LOW (ref 8.9–10.3)
Chloride: 108 mmol/L (ref 98–111)
Creatinine, Ser: 3.5 mg/dL — ABNORMAL HIGH (ref 0.44–1.00)
GFR, Estimated: 14 mL/min — ABNORMAL LOW (ref >60)
Glucose, Bld: 79 mg/dL (ref 70–99)
Phosphorus: 5.6 mg/dL — ABNORMAL HIGH (ref 2.5–4.6)
Potassium: 4 mmol/L (ref 3.5–5.1)
Sodium: 142 mmol/L (ref 135–145)

## 2022-04-08 MED ORDER — POLYSACCHARIDE IRON COMPLEX 150 MG PO CAPS
150.0000 mg | ORAL_CAPSULE | Freq: Every day | ORAL | 0 refills | Status: DC
Start: 2022-04-09 — End: 2022-06-02

## 2022-04-08 MED ORDER — TORSEMIDE 40 MG PO TABS: 40.0000 mg | ORAL_TABLET | Freq: Two times a day (BID) | ORAL | 0 refills | Status: DC

## 2022-04-08 NOTE — Care Management Important Message (Signed)
Important Message  Patient Details IM Letter given to the Patient Name: Kara Mullins MRN: [id number] Date of Birth: 08-09-1951   Medicare Important Message Given:  Yes     Caren Macadam 04/08/2022, 12:06 PM

## 2022-04-09 ENCOUNTER — Telehealth: Payer: Self-pay

## 2022-04-10 ENCOUNTER — Telehealth: Payer: Self-pay

## 2022-04-10 NOTE — Telephone Encounter (Signed)
Transition Care Management Follow-up Telephone Call Date of discharge and from where: [facility] 04/08/2022 How have you been since you were released from the hospital? Still  having difficulty walking Any questions or concerns? No  Items Reviewed: Did the pt receive and understand the discharge instructions provided? Yes  Medications obtained and verified? Yes  Other? No  Any new allergies since your discharge? No  Dietary orders reviewed? Yes Do you have support at home? Yes   Home Care and Equipment/Supplies: Were home health services ordered? not applicable If so, what is the name of the agency? N/A  Has the agency set up a time to come to the patient's home? not applicable Were any new equipment or medical supplies ordered?  No What is the name of the medical supply agency? N/A Were you able to get the supplies/equipment? not applicable Do you have any questions related to the use of the equipment or supplies? No  Functional Questionnaire: (I = Independent and D = Dependent) ADLs: D  Bathing/Dressing- D  Meal Prep- D  Eating- I  Maintaining continence- I  Transferring/Ambulation- D  Managing Meds- D  Follow up appointments reviewed:  PCP Hospital f/u appt confirmed? Yes  Scheduled to see [name], [name], NP  on 04/17/2022 @ [facility] f/u appt confirmed? No  Scheduled to see n/a on n/a @ n/a. Are transportation arrangements needed? No  If their condition worsens, is the pt aware to call PCP or go to the Emergency Dept.? Yes Was the patient provided with contact information for the PCP's office or ED? Yes Was to pt encouraged to call back with questions or concerns? Yes

## 2022-04-15 ENCOUNTER — Other Ambulatory Visit: Payer: Medicare (Managed Care) | Admitting: *Deleted

## 2022-04-15 ENCOUNTER — Other Ambulatory Visit: Payer: Self-pay | Admitting: Cardiology

## 2022-04-15 VITALS — BP 174/90 | HR 70 | Temp 97.6°F | Resp 16

## 2022-04-15 DIAGNOSIS — Z515 Encounter for palliative care: Secondary | ICD-10-CM

## 2022-04-15 NOTE — Progress Notes (Signed)
[facility] RN NOTE  PATIENT NAME: Kara Mullins DOB: [birth year] MRN: [id number]  PRIMARY CARE PROVIDER: [name], NP  RESPONSIBLE PARTY: Darletta Moll (daughter) Acct ID - Guarantor Home Phone Work Phone Relationship Acct Type  [id number] - [place] [phone]  Self P/F     [address], [place], [place] [address]   RN visit made with patient and her daughter, [name], in their home s/p recent hospitalization. Hospitalized at [facility] from 03/27/22 to 04/08/22 for hypertension, anasarca and temp of 92.2. She was diuresed and also given 2 units of PRBCs during hospitalization. Given antibiotics for UTI. SNF was recommended but she chose to return home with home health instead.  Pain: Patient reports pain in her legs, especially her right one. She is currently applying Bengay which does ease the pain.  Cardiovascular: Patient has some edema in both feet and legs up to her thighs. Currently wrapped with Ace bandages up to her knees. She reports edema has improved in her lower legs but is still present in her thighs. At the hospital her Furosemide was changed to Torsemide 40 mg BID, Carvedilol was increased from 1/2 to a whole tablet BID and Losartan was added back to her regimen. She does become dizzy in the am after taking her BP medications, but this resides within about an hour. Her blood pressure is elevated today at 174/90. Denies dizziness, headache or chest pain. Her BP drops when she sits up on the side of the bed. She has an upcoming virtual appointment with her PCP.  Respiratory: Reports some shortness of breath at times when sitting up to side of bed. Resolves fairly quickly while at rest.  Mobility: Patient has been unable to stand up straight since April this year. She is able to stand with assistance for about 10-15 seconds. She is non-ambulatory. She is able to sit up on the side of the bed for about 25 minutes before her BP starts  dropping. She is to restart PT/OT/RN with [facility].  She spends most of her day lying in bed. She is working on scooting and repositioning herself in bed. Daughter tries to work on exercises given between sessions.  Appetite: She is eating 3 meals/day. If she is not as hungry she will drink an Ensure for nutritional supplementation. She used to have ST but has been discharged as her swallowing has improved. Daughter did change her vitamins to either chewables or liquid.   GI: Occasional constipation alternating with diarrhea. She states Iron can be constipating at times and Ensure sometimes causes diarrhea. LBM yesterday morning. Stools started off form but changed into liquid.   GU: Has a foley catheter changed monthly by [facility] RN. Draining clear, yellow urine. Daughter says it is time for it to be changed. She will reach out to home health. It is changed monthly. Has an Urology appointment on 05/01/22. If patient unable to make it, they will work with home health for catheter.  Skin: Daughter reports some small bumps on patient's sacral area. She feels it was caused by an adhesive foam dressing that was used in the hospital. Since being removed she is applying Zinc. Areas are not open. She feels patient may be allergic to certain adhesives.  She is sleeping well during the night and naps often during the day. Overall she says she is feeling ok and glad to be back home. Will update Dr. Mariea Clonts. Palliative care will continue to follow.   PHYSICAL EXAM:   VITALS:  Today's Vitals   04/15/22 1426  BP: (!) 174/90  Pulse: 70  Resp: 16  Temp: 97.6 F (36.4 C)  TempSrc: Temporal  SpO2: 99%  PainSc: 3   PainLoc: Leg    LUNGS: clear to auscultation  CARDIAC: Cor RRR EXTREMITIES: Edema to bilateral feet and legs up to her thighs; Currently wrapped in Ace bandages. SKIN:  See note above   NEURO:  Alert and oriented x 3, pleasant mood, generalized weakness, non-ambulatory   (Duration of visit  and documentation 60 minutes)    Daryl Eastern, RN BSN

## 2022-04-16 DIAGNOSIS — E43 Unspecified severe protein-calorie malnutrition: Secondary | ICD-10-CM | POA: Diagnosis not present

## 2022-04-16 DIAGNOSIS — I4891 Unspecified atrial fibrillation: Secondary | ICD-10-CM | POA: Diagnosis not present

## 2022-04-16 DIAGNOSIS — N04A Nephrotic syndrome with C3 glomerulonephritis: Secondary | ICD-10-CM | POA: Diagnosis not present

## 2022-04-16 DIAGNOSIS — N185 Chronic kidney disease, stage 5: Secondary | ICD-10-CM | POA: Diagnosis not present

## 2022-04-17 ENCOUNTER — Encounter: Payer: Self-pay | Admitting: Family

## 2022-04-17 ENCOUNTER — Telehealth (INDEPENDENT_AMBULATORY_CARE_PROVIDER_SITE_OTHER): Payer: Medicare (Managed Care) | Admitting: Family

## 2022-04-17 DIAGNOSIS — E876 Hypokalemia: Secondary | ICD-10-CM | POA: Diagnosis not present

## 2022-04-17 DIAGNOSIS — Z978 Presence of other specified devices: Secondary | ICD-10-CM | POA: Diagnosis not present

## 2022-04-17 DIAGNOSIS — N04A Nephrotic syndrome with C3 glomerulonephritis: Secondary | ICD-10-CM | POA: Diagnosis not present

## 2022-04-17 DIAGNOSIS — R600 Localized edema: Secondary | ICD-10-CM

## 2022-04-17 DIAGNOSIS — I4891 Unspecified atrial fibrillation: Secondary | ICD-10-CM | POA: Diagnosis not present

## 2022-04-17 DIAGNOSIS — D72819 Decreased white blood cell count, unspecified: Secondary | ICD-10-CM

## 2022-04-17 DIAGNOSIS — E43 Unspecified severe protein-calorie malnutrition: Secondary | ICD-10-CM

## 2022-04-17 DIAGNOSIS — R159 Full incontinence of feces: Secondary | ICD-10-CM | POA: Diagnosis not present

## 2022-04-17 DIAGNOSIS — I5042 Chronic combined systolic (congestive) and diastolic (congestive) heart failure: Secondary | ICD-10-CM

## 2022-04-17 DIAGNOSIS — D631 Anemia in chronic kidney disease: Secondary | ICD-10-CM | POA: Diagnosis not present

## 2022-04-17 DIAGNOSIS — I5041 Acute combined systolic (congestive) and diastolic (congestive) heart failure: Secondary | ICD-10-CM | POA: Diagnosis not present

## 2022-04-17 DIAGNOSIS — N185 Chronic kidney disease, stage 5: Secondary | ICD-10-CM | POA: Diagnosis not present

## 2022-04-17 DIAGNOSIS — I132 Hypertensive heart and chronic kidney disease with heart failure and with stage 5 chronic kidney disease, or end stage renal disease: Secondary | ICD-10-CM | POA: Diagnosis not present

## 2022-04-17 MED ORDER — SODIUM BICARBONATE 650 MG PO TABS
650.0000 mg | ORAL_TABLET | Freq: Two times a day (BID) | ORAL | 0 refills | Status: DC
Start: 2022-04-17 — End: 2022-06-17

## 2022-04-17 NOTE — Patient Instructions (Signed)
[place] Nurse:  1. Draw labs : CBC/diff and BMP  2.  change Foley Catheter every 30 days with [place] Foley 16  and  10 mm 3.  Apply three layer wrap to bilateral lower extremities to keep swelling down.

## 2022-04-17 NOTE — Progress Notes (Signed)
This service is provided via telemedicine  No vital signs collected/recorded due to the encounter was a telemedicine visit.   Location of patient (ex: home, work):  Home  Patient consents to a telephone visit: Yes, see telephone visit dated 04/17/22  Location of the provider (ex: office, home):  [facility], Office   Name of any referring provider:  N/A  Names of all persons participating in the telemedicine service and their role in the encounter:  S.Chrae B/CMA, Charyl Dancer, NP, daughter [name]), and Patient   Time spent on call:  11 min with medical assistant      Provider: Webb Silversmith Dyasia Firestine FNP-C  Davette Nugent, [name], NP  Patient Care Team: Delois Silvester, [name], NP as PCP - General (Family Medicine) Donato Heinz, MD as PCP - Cardiology (Cardiology)  Extended Emergency Contact Information Primary Emergency Contact: [facility] Phone: [phone] Relation: Daughter  Code Status:  Full Code  Goals of care: Advanced Directive information    04/17/2022    3:00 PM  Advanced Directives  Does Patient Have a Medical Advance Directive? Yes  Type of Advance Directive Out of facility DNR (pink MOST or yellow form)  Does patient want to make changes to medical advance directive? No - Patient declined  Pre-existing out of facility DNR order (yellow form or pink MOST form) Pink MOST form placed in chart (order not valid for inpatient use)     Chief Complaint  Patient presents with   [facility] follow-up from 03/27/22-04/08/22. Discuss coreg dosing (two doses on medication list, patient is currently taking 6.25, however 12.5 was rx'ed recently by a different provider according to epic med list). Discuss swelling. Discuss rx/order for incontinence supplies, foley wipes, and foley catheters through [facility] [phone] (phone number)     HPI:  Pt is a 71 y.o. female seen today for an acute visit for transition of  care post hospitalization from 03/27/2022 - 04/08/2022 for high blood pressure and worsening lower extremity edema.Daughter took her to ED due to high B/p 220/150 at home due to not taking her blood pressure medication.Daughter gave carvedilol prior to taking her to ED. ED work-up showed worsening renal function with fluid overload as well as severe protein malnutrition and anasarca.  Nephrology was consulted and was started on IV diuresis with Lasix.  Renal function remained stable.  Intermittently metolazone was given.Nephrologist recommended torsemide  40 mg twice daily on discharge.metolazone was discontinued.  Hemoglobin dropped to 6.5 and was transfused 1 packed red blood cells later also dropped to 7.1 and she received another PRBC transfusion.  Hemoglobin stabilized at 8.  Also had leukocytopenia 2.7 improved to 3.1.Potassium 3.4 Total protein 5.0 and ALb < 1.5 .urine culture indicated multiple species  PT / OT recommended SNF but patient decided to go home instead.  Daughter states legs still swelling on the legs but has improved.No cough or shortness of breath.  Request  incontinent supplies  - uses 3 diapers per day.daughter states patient incontnent for bowels.  Foley Catheter - due for change of Foley catheter states has had it for 30 days.Has a [place] Foley 16 measuring 10 mm. Request Home Health Nurse to change the Foley catheter states HHPT was ordered at the hospital but not a Nurse. Has already been seen by [facility] PT.    Past Medical History:  Diagnosis Date   Chronic combined systolic and diastolic congestive heart failure ([place]) 11/09/2021   Generalized  anxiety disorder    Hyperlipidemia 11/09/2021   Hypertension    Stage 3b chronic kidney disease (CKD) ([place]) 11/09/2021   Past Surgical History:  Procedure Laterality Date   CATARACT EXTRACTION, BILATERAL     combined systolic and diastolic congestive heart failure       Allergies  Allergen Reactions    Chlorhexidine Other (See Comments)    Unknown reaction   Rosuvastatin Other (See Comments)    "Felt like she couldn't swallow"    Outpatient Encounter Medications as of 04/17/2022  Medication Sig   Ascorbic Acid (VITAMIN C PO) Take 1 tablet by mouth daily. Crush and mix with applesauce   carvedilol (COREG) 6.25 MG tablet Take 6.25 mg by mouth 2 (two) times daily.   iron polysaccharides (NIFEREX) 150 MG capsule Take 1 capsule (150 mg total) by mouth daily.   losartan (COZAAR) 25 MG tablet Take 25 mg by mouth daily.   Multiple Vitamin (MULTIVITAMIN WITH MINERALS) TABS tablet Take 1 tablet by mouth daily.   sodium bicarbonate 650 MG tablet Take 650 mg by mouth 2 (two) times daily.   torsemide 40 MG TABS Take 40 mg by mouth 2 (two) times daily.   Zinc 50 MG CAPS Take 1 capsule by mouth daily.   carvedilol (COREG) 12.5 MG tablet TAKE 1 TABLET(12.5 MG) BY MOUTH TWICE DAILY WITH A MEAL (Patient not taking: Reported on 04/17/2022)   No facility-administered encounter medications on file as of 04/17/2022.    Review of Systems  Constitutional:  Negative for appetite change, chills, fatigue, fever and unexpected weight change.  HENT:  Negative for congestion, dental problem, ear discharge, ear pain, facial swelling, hearing loss, nosebleeds, postnasal drip, rhinorrhea, sinus pressure, sinus pain, sneezing and sore throat.   Eyes:  Negative for pain, discharge, redness, itching and visual disturbance.  Respiratory:  Negative for cough, chest tightness, shortness of breath and wheezing.   Cardiovascular:  Positive for leg swelling. Negative for chest pain and palpitations.  Gastrointestinal:  Negative for abdominal distention, abdominal pain, blood in stool, constipation, diarrhea, nausea and vomiting.       Incontinent of bowel   Endocrine: Negative for cold intolerance, heat intolerance, polydipsia, polyphagia and polyuria.  Genitourinary:  Negative for urgency.       Foley catheter in place    Musculoskeletal:  Positive for gait problem. Negative for arthralgias, back pain, joint swelling, myalgias, neck pain and neck stiffness.  Skin:  Negative for color change, pallor, rash and wound.  Neurological:  Negative for dizziness, light-headedness, numbness and headaches.       Bed bound   Hematological:  Does not bruise/bleed easily.  Psychiatric/Behavioral:  Negative for agitation, behavioral problems, confusion, hallucinations and sleep disturbance. The patient is not nervous/anxious.     Immunization History  Administered Date(s) Administered   PFIZER Comirnaty(Gray Top)Covid-19 Tri-Sucrose Vaccine 04/28/2021   PFIZER(Purple Top)SARS-COV-2 Vaccination 12/16/2019, 01/12/2020   Zoster Recombinat (Shingrix) 07/09/2021   Pertinent  Health Maintenance Due  Topic Date Due   COLONOSCOPY (Pts 45-78yrs Insurance coverage will need to be confirmed)  Never done   MAMMOGRAM  Never done   DEXA SCAN  Never done   INFLUENZA VACCINE  05/14/2022      04/06/2022    9:08 PM 04/07/2022    8:00 AM 04/07/2022    8:59 PM 04/08/2022    8:00 AM 04/17/2022    2:59 PM  [employer] in the past year?     0  Was there an  injury with Fall?     0  Fall Risk Category Calculator     0  Fall Risk Category     Low  Patient Fall Risk Level Moderate fall risk Moderate fall risk Moderate fall risk Moderate fall risk Low fall risk  Patient at Risk for Falls Due to     No Fall Risks  Fall risk Follow up     Falls evaluation completed   Functional Status Survey:    There were no vitals filed for this visit. There is no height or weight on file to calculate BMI. Physical Exam Constitutional:      General: She is not in acute distress.    Appearance: She is not ill-appearing.  Pulmonary:     Effort: Pulmonary effort is normal. No respiratory distress.  Musculoskeletal:     Right lower leg: Edema present.     Left lower leg: Edema present.  Neurological:     Mental Status: She is alert. Mental  status is at baseline.  Psychiatric:        Mood and Affect: Mood normal.        Behavior: Behavior normal.     Labs reviewed: Recent Labs    01/01/22 0351 01/02/22 0410 03/27/22 0214 03/27/22 0314 03/28/22 0526 04/06/22 0615 04/07/22 0537 04/08/22 0457  NA 143 141   < >  --    < > 142 143 142  K 4.1 4.1   < >  --    < > 3.7 3.7 4.0  CL 119* 116*   < >  --    < > 108 109 108  CO2 16* 15*   < >  --    < > 27 28 29   GLUCOSE 86 69*   < >  --    < > 90 76 79  BUN 88* 83*   < >  --    < > 59* 62* 64*  CREATININE 5.63* 5.45*   < >  --    < > 3.27* 3.47* 3.50*  CALCIUM 6.9* 6.7*   < >  --    < > 7.9* 7.9* 7.6*  MG 2.9* 3.1*  --  2.3  --   --   --   --   PHOS 10.0* 9.6*  --   --    < > 5.7* 5.5* 5.6*   < > = values in this interval not displayed.   Recent Labs    01/02/22 0410 03/27/22 0214 03/28/22 0526 03/29/22 0430 03/30/22 0507 04/06/22 0615 04/07/22 0537 04/08/22 0457  AST 20 31  --  27  --   --   --   --   ALT 16 18  --  16  --   --   --   --   ALKPHOS 101 134*  --  117  --   --   --   --   BILITOT 0.3 0.5  --  0.4  --   --   --   --   PROT 4.9* 5.0*  --  5.1*  --   --   --   --   ALBUMIN 1.6* <1.5*   < > 1.8*   < > 1.9* 1.8* 1.7*   < > = values in this interval not displayed.   Recent Labs    01/01/22 0351 01/02/22 0410 03/27/22 0214 03/28/22 0526 04/02/22 0446 04/03/22 0529 04/06/22 0527  WBC 5.3 5.9 2.7*   < > 3.5* 3.9*  4.5  NEUTROABS 3.9 4.2 1.7  --   --   --   --   HGB 8.6* 8.8* 8.1*   < > 7.1* 8.2* 8.7*  HCT 27.0* 27.2* 25.9*   < > 22.5* 25.6* 27.1*  MCV 88.8 87.5 95.6   < > 96.2 94.8 94.4  PLT 139* 188 151   < > 127* 123* 150   < > = values in this interval not displayed.   Lab Results  Component Value Date   TSH 6.174 (H) 03/27/2022   No results found for: "HGBA1C" Lab Results  Component Value Date   CHOL 212 (H) 08/27/2021   HDL 67 08/27/2021   LDLCALC 128 (H) 08/27/2021   TRIG 78 08/27/2021   CHOLHDL 3.2 08/27/2021    Significant  Diagnostic Results in last 30 days:  DG Chest Portable 1 View  Result Date: 03/27/2022 CLINICAL DATA:  Shortness of breath, hypertension EXAM: PORTABLE CHEST 1 VIEW COMPARISON:  12/22/2021 FINDINGS: Layering bilateral pleural effusions. Bibasilar atelectasis or infiltrates. Heart is normal size. No acute bony abnormality. IMPRESSION: Layering bilateral effusions with bibasilar atelectasis or infiltrates. Electronically Signed   By: Rolm Baptise M.D.   On: 03/27/2022 02:29    Assessment/Plan 1. Chronic combined systolic and diastolic CHF (congestive heart failure) (HCC) Low extremity edema persists but denies any shortness of breath -Continue on torsemide 40 mg twice daily - Ambulatory referral to [place] nurse to draw BMP  2. Hypertensive heart and kidney disease with acute combined systolic and diastolic congestive heart failure and stage 5 chronic kidney disease not on chronic dialysis [facility]) Status post hospital admission for high blood pressure.  Home readings have improved -Continue on losartan, carvedilol and torsemide -Continue to monitor blood pressure at home notify provider if above 150/90 - Ambulatory referral to [place]  3. Edema, lower extremity Has improved plus but persist -Continue on torsemide - Ambulatory referral to [place] nurse to apply 3 layer wraps from base of toes to below the knee to keep edema down. -Encouraged to elevate lower extremities while in bed  4. Anemia of chronic kidney failure, stage 5 (HCC) S/p 2 units of packed red red blood cells during hospitalization.  Will recheck CBC in - Ambulatory referral to [place] did draw CBC with differential  5. Leukopenia, unspecified type WBCs low during hospitalization and improved on discharge to 3.1 we will recheck lab work - Ambulatory referral to [place]  6. Severe protein-calorie malnutrition ([place]) Continue to encourage oral intake - Ambulatory referral to Home Health  7.  Hypokalemia We will recheck BMP - Ambulatory referral to [place]  8. Foley catheter present Home health nurse to use change Foley catheter every 30 days with 16 F foley 10 mm .request Foley Catheter supplies and wipes request orders to be faxed to [employer] Tele# [phone] - [phone]  - For home use only DME Other see comment: 16 F foley 10 mm and Foley Catheter and wipes orders faxed by [facility]  - Ambulatory referral to Home Health: Nurse to change foley catheter every 30 days   9. Full incontinence of feces - For home use only DME Other see comment: request extra large diapers uses 3 diapers per day size 40 - 70 inches X 3 daily   Family/ staff Communication: Reviewed plan of care with patient verbalized understanding and daughter verbalized understanding.  Labs/tests ordered:  Home health Nurse to obtain CBC/diff and BMP  Next Appointment: As needed  if symptoms worsen or fail to improve    [name], NP

## 2022-04-22 DIAGNOSIS — N185 Chronic kidney disease, stage 5: Secondary | ICD-10-CM | POA: Diagnosis not present

## 2022-04-22 DIAGNOSIS — E43 Unspecified severe protein-calorie malnutrition: Secondary | ICD-10-CM | POA: Diagnosis not present

## 2022-04-22 DIAGNOSIS — I110 Hypertensive heart disease with heart failure: Secondary | ICD-10-CM | POA: Diagnosis not present

## 2022-04-22 DIAGNOSIS — I4891 Unspecified atrial fibrillation: Secondary | ICD-10-CM | POA: Diagnosis not present

## 2022-04-22 DIAGNOSIS — N04A Nephrotic syndrome with C3 glomerulonephritis: Secondary | ICD-10-CM | POA: Diagnosis not present

## 2022-04-22 LAB — BASIC METABOLIC PANEL
BUN: 56 — AB (ref 4–21)
CO2: 26 — AB (ref 13–22)
Chloride: 104 (ref 99–108)
Creatinine: 3 — AB (ref 0.5–1.1)
Glucose: 87
Potassium: 3.9 mEq/L (ref 3.5–5.1)
Sodium: 143 (ref 137–147)

## 2022-04-22 LAB — CBC AND DIFFERENTIAL
HCT: 24 — AB (ref 36–46)
Hemoglobin: 7.7 — AB (ref 12.0–16.0)
Neutrophils Absolute: 3.3
Platelets: 275 10*3/uL (ref 150–400)
WBC: 5.5

## 2022-04-22 LAB — CBC: RBC: 2.63 — AB (ref 3.87–5.11)

## 2022-04-22 LAB — COMPREHENSIVE METABOLIC PANEL
Calcium: 7.3 — AB (ref 8.7–10.7)
eGFR: 17

## 2022-04-23 ENCOUNTER — Telehealth: Payer: Self-pay | Admitting: Family

## 2022-04-23 ENCOUNTER — Encounter: Payer: Self-pay | Admitting: Family

## 2022-04-23 ENCOUNTER — Telehealth: Payer: Self-pay | Admitting: *Deleted

## 2022-04-23 DIAGNOSIS — E43 Unspecified severe protein-calorie malnutrition: Secondary | ICD-10-CM | POA: Diagnosis not present

## 2022-04-23 DIAGNOSIS — I110 Hypertensive heart disease with heart failure: Secondary | ICD-10-CM | POA: Diagnosis not present

## 2022-04-23 DIAGNOSIS — I4891 Unspecified atrial fibrillation: Secondary | ICD-10-CM | POA: Diagnosis not present

## 2022-04-23 DIAGNOSIS — N04A Nephrotic syndrome with C3 glomerulonephritis: Secondary | ICD-10-CM | POA: Diagnosis not present

## 2022-04-23 DIAGNOSIS — N185 Chronic kidney disease, stage 5: Secondary | ICD-10-CM | POA: Diagnosis not present

## 2022-04-23 NOTE — Telephone Encounter (Signed)
Lab results received from [facility] Hemoglobin 7.7 slightly low compared to previous.still following up with Oncologist for iron transfusion ?

## 2022-04-23 NOTE — Telephone Encounter (Signed)
Okay to give Home Health Nurse orders as requested. CBC and BMP to be drawn just once not repeated.

## 2022-04-23 NOTE — Telephone Encounter (Signed)
-----   Message from Albertina Senegal May, R[place] sent at 04/23/2022  3:40 PM EDT ----- Abstracted to Patient's Chart.

## 2022-04-23 NOTE — Telephone Encounter (Signed)
Kara Mullins with [facility] Notified and agreed.

## 2022-04-23 NOTE — Telephone Encounter (Signed)
Lorena with [facility] called requesting verbal orders for:  1.) To restart Home Health 1X9weeks.   2.) To apply 3 layer wrap to bilateral lower extremities to keep swelling down. [facility] initially ordered)  3.) Change Folly Cath. Every 30 days with [place] folly. [facility] initially ordered)  4.) Nurse drew labs yesterday of CBC and BMP that the Hospital ordered. Nurse is wanting to know if you wanted this repeated every month or what frequency if any.    Please Advise.

## 2022-04-24 ENCOUNTER — Emergency Department (HOSPITAL_COMMUNITY)
Admission: EM | Admit: 2022-04-24 | Discharge: 2022-04-25 | Disposition: A | Discharge status: Home / Self Care | Payer: Medicare (Managed Care) | Attending: Student | Admitting: Student

## 2022-04-24 ENCOUNTER — Other Ambulatory Visit: Payer: Self-pay

## 2022-04-24 ENCOUNTER — Encounter (HOSPITAL_COMMUNITY): Payer: Self-pay

## 2022-04-24 DIAGNOSIS — N185 Chronic kidney disease, stage 5: Secondary | ICD-10-CM | POA: Insufficient documentation

## 2022-04-24 DIAGNOSIS — Z8616 Personal history of COVID-19: Secondary | ICD-10-CM | POA: Insufficient documentation

## 2022-04-24 DIAGNOSIS — D631 Anemia in chronic kidney disease: Secondary | ICD-10-CM | POA: Diagnosis not present

## 2022-04-24 DIAGNOSIS — I5042 Chronic combined systolic (congestive) and diastolic (congestive) heart failure: Secondary | ICD-10-CM | POA: Diagnosis not present

## 2022-04-24 DIAGNOSIS — R799 Abnormal finding of blood chemistry, unspecified: Secondary | ICD-10-CM | POA: Diagnosis present

## 2022-04-24 DIAGNOSIS — Z79899 Other long term (current) drug therapy: Secondary | ICD-10-CM | POA: Diagnosis not present

## 2022-04-24 DIAGNOSIS — I132 Hypertensive heart and chronic kidney disease with heart failure and with stage 5 chronic kidney disease, or end stage renal disease: Secondary | ICD-10-CM | POA: Diagnosis not present

## 2022-04-24 DIAGNOSIS — D649 Anemia, unspecified: Secondary | ICD-10-CM | POA: Insufficient documentation

## 2022-04-24 LAB — COMPREHENSIVE METABOLIC PANEL
ALT: 20 U/L (ref 0–44)
AST: 26 U/L (ref 15–41)
Albumin: 1.7 g/dL — ABNORMAL LOW (ref 3.5–5.0)
Alkaline Phosphatase: 126 U/L (ref 38–126)
Anion gap: 7 (ref 5–15)
BUN: 61 mg/dL — ABNORMAL HIGH (ref 8–23)
CO2: 29 mmol/L (ref 22–32)
Calcium: 7.8 mg/dL — ABNORMAL LOW (ref 8.9–10.3)
Chloride: 110 mmol/L (ref 98–111)
Creatinine, Ser: 3.15 mg/dL — ABNORMAL HIGH (ref 0.44–1.00)
GFR, Estimated: 15 mL/min — ABNORMAL LOW (ref >60)
Glucose, Bld: 89 mg/dL (ref 70–99)
Potassium: 3.8 mmol/L (ref 3.5–5.1)
Sodium: 146 mmol/L — ABNORMAL HIGH (ref 135–145)
Total Bilirubin: 0.5 mg/dL (ref 0.3–1.2)
Total Protein: 5.9 g/dL — ABNORMAL LOW (ref 6.5–8.1)

## 2022-04-24 LAB — CBC WITH DIFFERENTIAL/PLATELET
Abs Immature Granulocytes: 0.01 10*3/uL (ref 0.00–0.07)
Basophils Absolute: 0.1 10*3/uL (ref 0.0–0.1)
Basophils Relative: 1 %
Eosinophils Absolute: 0.1 10*3/uL (ref 0.0–0.5)
Eosinophils Relative: 2 %
HCT: 29.1 % — ABNORMAL LOW (ref 36.0–46.0)
Hemoglobin: 9.2 g/dL — ABNORMAL LOW (ref 12.0–15.0)
Immature Granulocytes: 0 %
Lymphocytes Relative: 25 %
Lymphs Abs: 1.6 10*3/uL (ref 0.7–4.0)
MCH: 30.4 pg (ref 26.0–34.0)
MCHC: 31.6 g/dL (ref 30.0–36.0)
MCV: 96 fL (ref 80.0–100.0)
Monocytes Absolute: 0.3 10*3/uL (ref 0.1–1.0)
Monocytes Relative: 5 %
Neutro Abs: 4.4 10*3/uL (ref 1.7–7.7)
Neutrophils Relative %: 67 %
Platelets: 300 10*3/uL (ref 150–400)
RBC: 3.03 MIL/uL — ABNORMAL LOW (ref 3.87–5.11)
RDW: 13.1 % (ref 11.5–15.5)
WBC: 6.5 10*3/uL (ref 4.0–10.5)
nRBC: 0 % (ref 0.0–0.2)

## 2022-04-24 LAB — URINALYSIS, ROUTINE W REFLEX MICROSCOPIC
Bacteria, UA: NONE SEEN
Bilirubin Urine: NEGATIVE
Glucose, UA: 150 mg/dL — AB
Hgb urine dipstick: NEGATIVE
Ketones, ur: 5 mg/dL — AB
Leukocytes,Ua: NEGATIVE
Nitrite: NEGATIVE
Protein, ur: >=300 mg/dL — AB
Specific Gravity, Urine: 1.017 (ref 1.005–1.030)
pH: 7 (ref 5.0–8.0)

## 2022-04-24 LAB — TYPE AND SCREEN
ABO/RH(D): AB POS
Antibody Screen: NEGATIVE

## 2022-04-24 LAB — LACTIC ACID, PLASMA: Lactic Acid, Venous: 1 mmol/L (ref 0.5–1.9)

## 2022-04-24 MED ORDER — LACTATED RINGERS IV BOLUS
1000.0000 mL | Freq: Once | INTRAVENOUS | Status: AC
  Administered 2022-04-24: 1000 mL via INTRAVENOUS

## 2022-04-24 NOTE — ED Provider Notes (Signed)
[facility] DEPT Provider Note  CSN: [id number] Arrival date & time: 04/24/22 1854  Chief Complaint(s) Abnormal Labs  HPI Kara Mullins is a 71 y.o. female with PMH CKD, anemia of chronic disease, generalized anxiety disorder, HTN, HLD, CHF who presents emergency department for evaluation of abnormal labs.  Patient has a history of chronic anemia but went from 8.7-7.7 today and was sent in for possible need for blood transfusion.  Patient arrives stating that she feels "sleepier" than normal but denies chest pain, shortness of breath, abdominal pain, nausea, vomiting, headache, fever, GI bleeding or any other systemic symptoms.  On arrival, patient had an episode of orthostatic presyncope and dropped her blood pressures in the 70s but these quickly rebounded on my evaluation back in the resuscitation bay.   Past Medical History Past Medical History:  Diagnosis Date   Chronic combined systolic and diastolic congestive heart failure ([place]) 11/09/2021   Generalized anxiety disorder    Hyperlipidemia 11/09/2021   Hypertension    Stage 3b chronic kidney disease (CKD) ([place]) 11/09/2021   Patient Active Problem List   Diagnosis Date Noted   ARF (acute renal failure) ([place]) 03/28/2022   Anasarca associated with disorder of kidney 03/27/2022   Leukocytopenia 03/27/2022   Aortic atherosclerosis ([place]) 03/27/2022   Pressure injury of skin 01/02/2022   Hypotension, unspecified 12/30/2021   Pneumonia due to COVID-19 virus 12/26/2021   Palliative care by specialist    Goals of care, counseling/discussion    General weakness    Sepsis ([place]) 12/22/2021   COVID-19 virus infection 12/22/2021   Paroxysmal atrial fibrillation ([place]) 12/22/2021   CKD (chronic kidney disease) stage 5, GFR less than 15 ml/min (HCC) 12/22/2021   Anemia of chronic kidney failure, stage 5 ([place]) 12/22/2021   Prolonged QT interval 12/22/2021   Debility 45/80/9983   Metabolic acidosis 38/25/0539    Severe sepsis ([place]) 11/19/2021   Hypothermia 11/09/2021   Hyperlipidemia 11/09/2021   Chronic combined systolic and diastolic congestive heart failure ([place]) 11/09/2021   Pancytopenia ([place]) 11/09/2021   Severe protein-calorie malnutrition ([place]) 11/09/2021   Acute combined systolic and diastolic HF (heart failure) (HCC)    CHF exacerbation ([place]) 03/06/2021   Essential hypertension    Hypertensive urgency 03/05/2021   Acute CHF (congestive heart failure) ([place]) 03/05/2021   Nausea and vomiting 03/05/2021   Elevated troponin level not due myocardial infarction 03/05/2021   Elevated d-dimer 03/05/2021   Generalized anxiety disorder 03/05/2021   Acute congestive heart failure ([place]) 03/05/2021   Home Medication(s) Prior to Admission medications   Medication Sig Start Date End Date Taking? Authorizing Provider  Ascorbic Acid (VITAMIN C PO) Take 1 tablet by mouth daily. Crush and mix with applesauce   Yes [provider]  carvedilol (COREG) 6.25 MG tablet Take 6.25 mg by mouth 2 (two) times daily. 01/20/22  Yes [provider]  iron polysaccharides (NIFEREX) 150 MG capsule Take 1 capsule (150 mg total) by mouth daily. 04/09/22 05/09/22 Yes Pahwani, Einar Grad, MD  losartan (COZAAR) 25 MG tablet Take 25 mg by mouth daily. 01/15/22  Yes [provider]  Multiple Vitamin (MULTIVITAMIN WITH MINERALS) TABS tablet Take 1 tablet by mouth daily. 11/24/21  Yes Pokhrel, L[place], MD  sodium bicarbonate 650 MG tablet Take 1 tablet (650 mg total) by mouth 2 (two) times daily. 04/17/22 07/16/22 Yes [name], [name], NP  torsemide 40 MG TABS Take 40 mg by mouth 2 (two) times daily. 04/08/22 05/08/22 Yes Darliss Cheney, MD  Zinc 50  MG CAPS Take 1 capsule by mouth daily.   Yes [provider]  carvedilol (COREG) 12.5 MG tablet TAKE 1 TABLET(12.5 MG) BY MOUTH TWICE DAILY WITH A MEAL Patient not taking: Reported on 04/17/2022 04/15/22   Donato Heinz, MD                                                                                                                                     Past Surgical History Past Surgical History:  Procedure Laterality Date   CATARACT EXTRACTION, BILATERAL     combined systolic and diastolic congestive heart failure      Family History Family History  Problem Relation Age of Onset   Throat cancer Father     Social History Social History   Tobacco Use   Smoking status: Never   Smokeless tobacco: Never  Vaping Use   Vaping Use: Never used  Substance Use Topics   Alcohol use: Not Currently   Drug use: Never   Allergies Chlorhexidine and Rosuvastatin  Review of Systems Review of Systems  Constitutional:  Positive for fatigue.    Physical Exam Vital Signs  I have reviewed the triage vital signs BP (!) 173/83   Pulse (!) 46   Temp 98.5 F (36.9 C) (Oral)   Resp 14   Ht 5\' 5"  (1.651 m)   Wt 81.6 kg   SpO2 96%   BMI 29.95 kg/m   Physical Exam Vitals and nursing note reviewed.  Constitutional:      General: She is not in acute distress.    Appearance: She is well-developed.  HENT:     Head: Normocephalic and atraumatic.  Eyes:     Conjunctiva/sclera: Conjunctivae normal.  Cardiovascular:     Rate and Rhythm: Normal rate and regular rhythm.     Heart sounds: No murmur heard. Pulmonary:     Effort: Pulmonary effort is normal. No respiratory distress.     Breath sounds: Normal breath sounds.  Abdominal:     Palpations: Abdomen is soft.     Tenderness: There is no abdominal tenderness.  Musculoskeletal:        General: No swelling.     Cervical back: Neck supple.  Skin:    General: Skin is warm and dry.     Capillary Refill: Capillary refill takes less than 2 seconds.  Neurological:     Mental Status: She is alert.  Psychiatric:        Mood and Affect: Mood normal.     ED Results and Treatments Labs (all labs ordered are listed, but only abnormal results are displayed) Labs Reviewed  URINALYSIS, ROUTINE W  REFLEX MICROSCOPIC - Abnormal; Notable for the following components:      Result Value   Glucose, UA 150 (*)    Ketones, ur 5 (*)    Protein, ur >=300 (*)    All other components within normal limits  CBC  WITH DIFFERENTIAL/PLATELET - Abnormal; Notable for the following components:   RBC 3.03 (*)    Hemoglobin 9.2 (*)    HCT 29.1 (*)    All other components within normal limits  COMPREHENSIVE METABOLIC PANEL - Abnormal; Notable for the following components:   Sodium 146 (*)    BUN 61 (*)    Creatinine, Ser 3.15 (*)    Calcium 7.8 (*)    Total Protein 5.9 (*)    Albumin 1.7 (*)    GFR, Estimated 15 (*)    All other components within normal limits  LACTIC ACID, PLASMA  CBC WITH DIFFERENTIAL/PLATELET  TYPE AND SCREEN                                                                                                                          Radiology No results found.  Pertinent labs & imaging results that were available during my care of the patient were reviewed by me and considered in my medical decision making (see MDM for details).  Medications Ordered in ED Medications  lactated ringers bolus 1,000 mL (0 mLs Intravenous Stopped 04/24/22 2222)                                                                                                                                     Procedures Procedures  (including critical care time)  Medical Decision Making / ED Course   This patient presents to the ED for concern of abnormal labs, this involves an extensive number of treatment options, and is a complaint that carries with it a high risk of complications and morbidity.  The differential diagnosis includes anemia of chronic disease, dehydration, electrolyte abnormality, acute blood loss anemia  MDM: Patient seen emergency room for evaluation of abnormal labs.  Laboratory evaluation with hemoglobin of 9.2 which is significant improved from the 7.7 previously reported, urinalysis with  21-50 white blood cells but no bacteria or nitrites or leuk esterase and this is likely chronic in the setting of her Foley catheterizations, BUN 61, creatinine 3.15 which is baseline for the patient, albumin significantly low at 1.7.  Patient encouraged to increase her protein intake.  Patient given a liter of fluids and she states that she feels dramatically improved.  Patient does not meet inpatient criteria for admission she is safe for discharge with outpatient follow-up.   Additional history obtained: -Additional history obtained from  daughter -External records from outside source obtained and reviewed including: Chart review including previous notes, labs, imaging, consultation notes   Lab Tests: -I ordered, reviewed, and interpreted labs.   The pertinent results include:   Labs Reviewed  URINALYSIS, ROUTINE W REFLEX MICROSCOPIC - Abnormal; Notable for the following components:      Result Value   Glucose, UA 150 (*)    Ketones, ur 5 (*)    Protein, ur >=300 (*)    All other components within normal limits  CBC WITH DIFFERENTIAL/PLATELET - Abnormal; Notable for the following components:   RBC 3.03 (*)    Hemoglobin 9.2 (*)    HCT 29.1 (*)    All other components within normal limits  COMPREHENSIVE METABOLIC PANEL - Abnormal; Notable for the following components:   Sodium 146 (*)    BUN 61 (*)    Creatinine, Ser 3.15 (*)    Calcium 7.8 (*)    Total Protein 5.9 (*)    Albumin 1.7 (*)    GFR, Estimated 15 (*)    All other components within normal limits  LACTIC ACID, PLASMA  CBC WITH DIFFERENTIAL/PLATELET  TYPE AND SCREEN     Medicines ordered and prescription drug management: Meds ordered this encounter  Medications   lactated ringers bolus 1,000 mL    -I have reviewed the patients home medicines and have made adjustments as needed  Critical interventions none    Cardiac Monitoring: The patient was maintained on a cardiac monitor.  I personally viewed and  interpreted the cardiac monitored which showed an underlying rhythm of: NSR  Social Determinants of Health:  Factors impacting patients care include: none   Reevaluation: After the interventions noted above, I reevaluated the patient and found that they have :improved  Co morbidities that complicate the patient evaluation  Past Medical History:  Diagnosis Date   Chronic combined systolic and diastolic congestive heart failure ([place]) 11/09/2021   Generalized anxiety disorder    Hyperlipidemia 11/09/2021   Hypertension    Stage 3b chronic kidney disease (CKD) ([place]) 11/09/2021      Dispostion: I considered admission for this patient, but she does not meet inpatient criteria for admission she is currently safe for discharge with outpatient follow-up     Final Clinical Impression(s) / ED Diagnoses Final diagnoses:  Anemia, unspecified type     @PCDICTATION @    Teressa Lower, MD 04/25/22 0019

## 2022-04-24 NOTE — Telephone Encounter (Signed)
Daughter Notified and agreed.

## 2022-04-24 NOTE — ED Provider Triage Note (Signed)
Emergency Medicine Provider Triage Evaluation Note  Kara Mullins , a 71 y.o. female  was evaluated in triage.  Pt complains of low hemoglobin.  Hemoglobin recently found to be 7.8 during lab draw from home health.  Patient reports mild lightheadedness when she sits up.  No shortness of breath or chest pain.  She denies active bleeding in the urine or stool.  Foley catheter in place.  Review of Systems  Positive: Fatigue Negative: Shortness of breath  Physical Exam  BP 108/75 (BP Location: Right Arm)   Pulse (!) 56   Temp 98.5 F (36.9 C) (Oral)   Resp 16   Ht 5\' 5"  (1.651 m)   Wt 81.6 kg   SpO2 100%   BMI 29.95 kg/m  Gen:   Awake, no distress   Resp:  Normal effort  MSK:   Moves extremities without difficulty  Other:    Medical Decision Making  Medically screening exam initiated at 7:26 PM.  Appropriate orders placed.  Kara Mullins was informed that the remainder of the evaluation will be completed by another provider, this initial triage assessment does not replace that evaluation, and the importance of remaining in the ED until their evaluation is complete.     Carlisle Cater, [name] 04/24/22 1927

## 2022-04-24 NOTE — Telephone Encounter (Signed)
LMOM to return call.

## 2022-04-24 NOTE — ED Triage Notes (Signed)
Pt presents to ED from home, sent to ED by PCP for low hemoglobin. Pt endorses dizziness and BLE leg pain, denies other symptoms.

## 2022-04-24 NOTE — Telephone Encounter (Signed)
Spoke with Daughter and she stated that No they haven't gone back to the Oncologist. Stated that they went Months ago and they acted like there was nothing else they could do for the patient.   Patient is only currently taking the medications that the Hospital Prescribed. Stated that patient received 2 transfusions while in the hospital.   Please Advise.

## 2022-04-24 NOTE — Telephone Encounter (Signed)
Recommend follow up in ED for low Hemoglobin.

## 2022-04-25 DIAGNOSIS — Z466 Encounter for fitting and adjustment of urinary device: Secondary | ICD-10-CM | POA: Diagnosis not present

## 2022-04-25 DIAGNOSIS — D631 Anemia in chronic kidney disease: Secondary | ICD-10-CM | POA: Diagnosis not present

## 2022-04-25 DIAGNOSIS — I480 Paroxysmal atrial fibrillation: Secondary | ICD-10-CM | POA: Diagnosis not present

## 2022-04-25 DIAGNOSIS — D72819 Decreased white blood cell count, unspecified: Secondary | ICD-10-CM | POA: Diagnosis not present

## 2022-04-25 DIAGNOSIS — D61818 Other pancytopenia: Secondary | ICD-10-CM | POA: Diagnosis not present

## 2022-04-25 DIAGNOSIS — I700 Atherosclerosis of aorta: Secondary | ICD-10-CM | POA: Diagnosis not present

## 2022-04-25 DIAGNOSIS — I132 Hypertensive heart and chronic kidney disease with heart failure and with stage 5 chronic kidney disease, or end stage renal disease: Secondary | ICD-10-CM | POA: Diagnosis not present

## 2022-04-25 DIAGNOSIS — E785 Hyperlipidemia, unspecified: Secondary | ICD-10-CM | POA: Diagnosis not present

## 2022-04-25 DIAGNOSIS — E43 Unspecified severe protein-calorie malnutrition: Secondary | ICD-10-CM | POA: Diagnosis not present

## 2022-04-25 DIAGNOSIS — F411 Generalized anxiety disorder: Secondary | ICD-10-CM | POA: Diagnosis not present

## 2022-04-25 DIAGNOSIS — E876 Hypokalemia: Secondary | ICD-10-CM | POA: Diagnosis not present

## 2022-04-25 DIAGNOSIS — R159 Full incontinence of feces: Secondary | ICD-10-CM | POA: Diagnosis not present

## 2022-04-25 DIAGNOSIS — R339 Retention of urine, unspecified: Secondary | ICD-10-CM | POA: Diagnosis not present

## 2022-05-01 DIAGNOSIS — I132 Hypertensive heart and chronic kidney disease with heart failure and with stage 5 chronic kidney disease, or end stage renal disease: Secondary | ICD-10-CM | POA: Diagnosis not present

## 2022-05-01 DIAGNOSIS — D61818 Other pancytopenia: Secondary | ICD-10-CM | POA: Diagnosis not present

## 2022-05-01 DIAGNOSIS — E785 Hyperlipidemia, unspecified: Secondary | ICD-10-CM | POA: Diagnosis not present

## 2022-05-01 DIAGNOSIS — D631 Anemia in chronic kidney disease: Secondary | ICD-10-CM | POA: Diagnosis not present

## 2022-05-01 DIAGNOSIS — Z466 Encounter for fitting and adjustment of urinary device: Secondary | ICD-10-CM | POA: Diagnosis not present

## 2022-05-01 DIAGNOSIS — I480 Paroxysmal atrial fibrillation: Secondary | ICD-10-CM | POA: Diagnosis not present

## 2022-05-01 DIAGNOSIS — E43 Unspecified severe protein-calorie malnutrition: Secondary | ICD-10-CM | POA: Diagnosis not present

## 2022-05-01 DIAGNOSIS — D72819 Decreased white blood cell count, unspecified: Secondary | ICD-10-CM | POA: Diagnosis not present

## 2022-05-01 DIAGNOSIS — F411 Generalized anxiety disorder: Secondary | ICD-10-CM | POA: Diagnosis not present

## 2022-05-01 DIAGNOSIS — R339 Retention of urine, unspecified: Secondary | ICD-10-CM | POA: Diagnosis not present

## 2022-05-01 DIAGNOSIS — I700 Atherosclerosis of aorta: Secondary | ICD-10-CM | POA: Diagnosis not present

## 2022-05-01 DIAGNOSIS — R159 Full incontinence of feces: Secondary | ICD-10-CM | POA: Diagnosis not present

## 2022-05-01 DIAGNOSIS — E876 Hypokalemia: Secondary | ICD-10-CM | POA: Diagnosis not present

## 2022-05-03 DIAGNOSIS — R159 Full incontinence of feces: Secondary | ICD-10-CM | POA: Diagnosis not present

## 2022-05-03 DIAGNOSIS — I700 Atherosclerosis of aorta: Secondary | ICD-10-CM | POA: Diagnosis not present

## 2022-05-03 DIAGNOSIS — I480 Paroxysmal atrial fibrillation: Secondary | ICD-10-CM | POA: Diagnosis not present

## 2022-05-03 DIAGNOSIS — E876 Hypokalemia: Secondary | ICD-10-CM | POA: Diagnosis not present

## 2022-05-03 DIAGNOSIS — D72819 Decreased white blood cell count, unspecified: Secondary | ICD-10-CM | POA: Diagnosis not present

## 2022-05-03 DIAGNOSIS — D631 Anemia in chronic kidney disease: Secondary | ICD-10-CM | POA: Diagnosis not present

## 2022-05-03 DIAGNOSIS — E785 Hyperlipidemia, unspecified: Secondary | ICD-10-CM | POA: Diagnosis not present

## 2022-05-03 DIAGNOSIS — I132 Hypertensive heart and chronic kidney disease with heart failure and with stage 5 chronic kidney disease, or end stage renal disease: Secondary | ICD-10-CM | POA: Diagnosis not present

## 2022-05-03 DIAGNOSIS — F411 Generalized anxiety disorder: Secondary | ICD-10-CM | POA: Diagnosis not present

## 2022-05-03 DIAGNOSIS — D61818 Other pancytopenia: Secondary | ICD-10-CM | POA: Diagnosis not present

## 2022-05-03 DIAGNOSIS — R339 Retention of urine, unspecified: Secondary | ICD-10-CM | POA: Diagnosis not present

## 2022-05-03 DIAGNOSIS — E43 Unspecified severe protein-calorie malnutrition: Secondary | ICD-10-CM | POA: Diagnosis not present

## 2022-05-03 DIAGNOSIS — Z466 Encounter for fitting and adjustment of urinary device: Secondary | ICD-10-CM | POA: Diagnosis not present

## 2022-05-07 DIAGNOSIS — F411 Generalized anxiety disorder: Secondary | ICD-10-CM | POA: Diagnosis not present

## 2022-05-07 DIAGNOSIS — E43 Unspecified severe protein-calorie malnutrition: Secondary | ICD-10-CM | POA: Diagnosis not present

## 2022-05-07 DIAGNOSIS — I700 Atherosclerosis of aorta: Secondary | ICD-10-CM | POA: Diagnosis not present

## 2022-05-07 DIAGNOSIS — D631 Anemia in chronic kidney disease: Secondary | ICD-10-CM | POA: Diagnosis not present

## 2022-05-07 DIAGNOSIS — D72819 Decreased white blood cell count, unspecified: Secondary | ICD-10-CM | POA: Diagnosis not present

## 2022-05-07 DIAGNOSIS — Z466 Encounter for fitting and adjustment of urinary device: Secondary | ICD-10-CM | POA: Diagnosis not present

## 2022-05-07 DIAGNOSIS — R339 Retention of urine, unspecified: Secondary | ICD-10-CM | POA: Diagnosis not present

## 2022-05-07 DIAGNOSIS — E876 Hypokalemia: Secondary | ICD-10-CM | POA: Diagnosis not present

## 2022-05-07 DIAGNOSIS — R159 Full incontinence of feces: Secondary | ICD-10-CM | POA: Diagnosis not present

## 2022-05-07 DIAGNOSIS — I480 Paroxysmal atrial fibrillation: Secondary | ICD-10-CM | POA: Diagnosis not present

## 2022-05-07 DIAGNOSIS — D61818 Other pancytopenia: Secondary | ICD-10-CM | POA: Diagnosis not present

## 2022-05-07 DIAGNOSIS — I132 Hypertensive heart and chronic kidney disease with heart failure and with stage 5 chronic kidney disease, or end stage renal disease: Secondary | ICD-10-CM | POA: Diagnosis not present

## 2022-05-07 DIAGNOSIS — E785 Hyperlipidemia, unspecified: Secondary | ICD-10-CM | POA: Diagnosis not present

## 2022-05-10 DIAGNOSIS — D631 Anemia in chronic kidney disease: Secondary | ICD-10-CM | POA: Diagnosis not present

## 2022-05-10 DIAGNOSIS — R159 Full incontinence of feces: Secondary | ICD-10-CM | POA: Diagnosis not present

## 2022-05-10 DIAGNOSIS — I700 Atherosclerosis of aorta: Secondary | ICD-10-CM | POA: Diagnosis not present

## 2022-05-10 DIAGNOSIS — E876 Hypokalemia: Secondary | ICD-10-CM | POA: Diagnosis not present

## 2022-05-10 DIAGNOSIS — R339 Retention of urine, unspecified: Secondary | ICD-10-CM | POA: Diagnosis not present

## 2022-05-10 DIAGNOSIS — Z466 Encounter for fitting and adjustment of urinary device: Secondary | ICD-10-CM | POA: Diagnosis not present

## 2022-05-10 DIAGNOSIS — D72819 Decreased white blood cell count, unspecified: Secondary | ICD-10-CM | POA: Diagnosis not present

## 2022-05-10 DIAGNOSIS — E43 Unspecified severe protein-calorie malnutrition: Secondary | ICD-10-CM | POA: Diagnosis not present

## 2022-05-10 DIAGNOSIS — E785 Hyperlipidemia, unspecified: Secondary | ICD-10-CM | POA: Diagnosis not present

## 2022-05-10 DIAGNOSIS — F411 Generalized anxiety disorder: Secondary | ICD-10-CM | POA: Diagnosis not present

## 2022-05-10 DIAGNOSIS — I132 Hypertensive heart and chronic kidney disease with heart failure and with stage 5 chronic kidney disease, or end stage renal disease: Secondary | ICD-10-CM | POA: Diagnosis not present

## 2022-05-10 DIAGNOSIS — I480 Paroxysmal atrial fibrillation: Secondary | ICD-10-CM | POA: Diagnosis not present

## 2022-05-10 DIAGNOSIS — D61818 Other pancytopenia: Secondary | ICD-10-CM | POA: Diagnosis not present

## 2022-05-13 DIAGNOSIS — E785 Hyperlipidemia, unspecified: Secondary | ICD-10-CM | POA: Diagnosis not present

## 2022-05-13 DIAGNOSIS — E43 Unspecified severe protein-calorie malnutrition: Secondary | ICD-10-CM | POA: Diagnosis not present

## 2022-05-13 DIAGNOSIS — D61818 Other pancytopenia: Secondary | ICD-10-CM | POA: Diagnosis not present

## 2022-05-13 DIAGNOSIS — F411 Generalized anxiety disorder: Secondary | ICD-10-CM | POA: Diagnosis not present

## 2022-05-13 DIAGNOSIS — E876 Hypokalemia: Secondary | ICD-10-CM | POA: Diagnosis not present

## 2022-05-13 DIAGNOSIS — R339 Retention of urine, unspecified: Secondary | ICD-10-CM | POA: Diagnosis not present

## 2022-05-13 DIAGNOSIS — R159 Full incontinence of feces: Secondary | ICD-10-CM | POA: Diagnosis not present

## 2022-05-13 DIAGNOSIS — I480 Paroxysmal atrial fibrillation: Secondary | ICD-10-CM | POA: Diagnosis not present

## 2022-05-13 DIAGNOSIS — D631 Anemia in chronic kidney disease: Secondary | ICD-10-CM | POA: Diagnosis not present

## 2022-05-13 DIAGNOSIS — I132 Hypertensive heart and chronic kidney disease with heart failure and with stage 5 chronic kidney disease, or end stage renal disease: Secondary | ICD-10-CM | POA: Diagnosis not present

## 2022-05-13 DIAGNOSIS — I700 Atherosclerosis of aorta: Secondary | ICD-10-CM | POA: Diagnosis not present

## 2022-05-13 DIAGNOSIS — Z466 Encounter for fitting and adjustment of urinary device: Secondary | ICD-10-CM | POA: Diagnosis not present

## 2022-05-13 DIAGNOSIS — D72819 Decreased white blood cell count, unspecified: Secondary | ICD-10-CM | POA: Diagnosis not present

## 2022-05-14 ENCOUNTER — Telehealth: Payer: Self-pay

## 2022-05-14 ENCOUNTER — Telehealth: Payer: Self-pay | Admitting: Cardiology

## 2022-05-14 DIAGNOSIS — E785 Hyperlipidemia, unspecified: Secondary | ICD-10-CM | POA: Diagnosis not present

## 2022-05-14 DIAGNOSIS — I700 Atherosclerosis of aorta: Secondary | ICD-10-CM | POA: Diagnosis not present

## 2022-05-14 DIAGNOSIS — I132 Hypertensive heart and chronic kidney disease with heart failure and with stage 5 chronic kidney disease, or end stage renal disease: Secondary | ICD-10-CM | POA: Diagnosis not present

## 2022-05-14 DIAGNOSIS — I480 Paroxysmal atrial fibrillation: Secondary | ICD-10-CM | POA: Diagnosis not present

## 2022-05-14 DIAGNOSIS — D72819 Decreased white blood cell count, unspecified: Secondary | ICD-10-CM | POA: Diagnosis not present

## 2022-05-14 DIAGNOSIS — E876 Hypokalemia: Secondary | ICD-10-CM | POA: Diagnosis not present

## 2022-05-14 DIAGNOSIS — R339 Retention of urine, unspecified: Secondary | ICD-10-CM | POA: Diagnosis not present

## 2022-05-14 DIAGNOSIS — Z466 Encounter for fitting and adjustment of urinary device: Secondary | ICD-10-CM | POA: Diagnosis not present

## 2022-05-14 DIAGNOSIS — F411 Generalized anxiety disorder: Secondary | ICD-10-CM | POA: Diagnosis not present

## 2022-05-14 DIAGNOSIS — D631 Anemia in chronic kidney disease: Secondary | ICD-10-CM | POA: Diagnosis not present

## 2022-05-14 DIAGNOSIS — R159 Full incontinence of feces: Secondary | ICD-10-CM | POA: Diagnosis not present

## 2022-05-14 DIAGNOSIS — E43 Unspecified severe protein-calorie malnutrition: Secondary | ICD-10-CM | POA: Diagnosis not present

## 2022-05-14 DIAGNOSIS — D61818 Other pancytopenia: Secondary | ICD-10-CM | POA: Diagnosis not present

## 2022-05-14 NOTE — Telephone Encounter (Signed)
Increase Losartan from 25 mg to 50 mg tablet one by mouth daily  Check blood pressure and record then follow up at the office to recheck blood pressure.

## 2022-05-14 NOTE — Telephone Encounter (Signed)
Kara Mullins with [facility] left meesage on Clinical intake voicemail stating that patient's bp was elevated today 173/81. Do you want to adjust medication?   Call daughter [phone]   Message routed to Marlowe Sax, NP

## 2022-05-14 NOTE — Telephone Encounter (Signed)
Pt daughter called that that pt bp was elevated and wanted to let Dr. Gardiner Rhyme know, she was seen in the ED and wanted the nurse to give her a call back.  172/83

## 2022-05-14 NOTE — Telephone Encounter (Signed)
Left a message for the patient to call back.  Blood pressure has been addressed by PCP. See epic note.

## 2022-05-15 ENCOUNTER — Ambulatory Visit: Payer: Medicare (Managed Care) | Admitting: Physician Assistant

## 2022-05-15 NOTE — Telephone Encounter (Signed)
Spoke with pt daughter, she reports the medical doctor is taking care of the blood pressure.

## 2022-05-15 NOTE — Telephone Encounter (Signed)
Spoke with patient's daughter [name] and she verbalized her understanding. She will call back to schedule follow up appointment.

## 2022-05-16 DIAGNOSIS — I129 Hypertensive chronic kidney disease with stage 1 through stage 4 chronic kidney disease, or unspecified chronic kidney disease: Secondary | ICD-10-CM | POA: Diagnosis not present

## 2022-05-16 DIAGNOSIS — I132 Hypertensive heart and chronic kidney disease with heart failure and with stage 5 chronic kidney disease, or end stage renal disease: Secondary | ICD-10-CM | POA: Diagnosis not present

## 2022-05-16 DIAGNOSIS — E785 Hyperlipidemia, unspecified: Secondary | ICD-10-CM | POA: Diagnosis not present

## 2022-05-16 DIAGNOSIS — I480 Paroxysmal atrial fibrillation: Secondary | ICD-10-CM | POA: Diagnosis not present

## 2022-05-16 DIAGNOSIS — R768 Other specified abnormal immunological findings in serum: Secondary | ICD-10-CM | POA: Diagnosis not present

## 2022-05-16 DIAGNOSIS — R339 Retention of urine, unspecified: Secondary | ICD-10-CM | POA: Diagnosis not present

## 2022-05-16 DIAGNOSIS — Z466 Encounter for fitting and adjustment of urinary device: Secondary | ICD-10-CM | POA: Diagnosis not present

## 2022-05-16 DIAGNOSIS — R159 Full incontinence of feces: Secondary | ICD-10-CM | POA: Diagnosis not present

## 2022-05-16 DIAGNOSIS — E876 Hypokalemia: Secondary | ICD-10-CM | POA: Diagnosis not present

## 2022-05-16 DIAGNOSIS — E43 Unspecified severe protein-calorie malnutrition: Secondary | ICD-10-CM | POA: Diagnosis not present

## 2022-05-16 DIAGNOSIS — N184 Chronic kidney disease, stage 4 (severe): Secondary | ICD-10-CM | POA: Diagnosis not present

## 2022-05-16 DIAGNOSIS — D72819 Decreased white blood cell count, unspecified: Secondary | ICD-10-CM | POA: Diagnosis not present

## 2022-05-16 DIAGNOSIS — I700 Atherosclerosis of aorta: Secondary | ICD-10-CM | POA: Diagnosis not present

## 2022-05-16 DIAGNOSIS — I5032 Chronic diastolic (congestive) heart failure: Secondary | ICD-10-CM | POA: Diagnosis not present

## 2022-05-16 DIAGNOSIS — D631 Anemia in chronic kidney disease: Secondary | ICD-10-CM | POA: Diagnosis not present

## 2022-05-16 DIAGNOSIS — D61818 Other pancytopenia: Secondary | ICD-10-CM | POA: Diagnosis not present

## 2022-05-16 DIAGNOSIS — F411 Generalized anxiety disorder: Secondary | ICD-10-CM | POA: Diagnosis not present

## 2022-05-17 LAB — IRON,TIBC AND FERRITIN PANEL
%SAT: 32
Iron: 38
TIBC: 120
UIBC: 82

## 2022-05-17 LAB — VITAMIN D 25 HYDROXY (VIT D DEFICIENCY, FRACTURES): Vit D, 25-Hydroxy: 7.6

## 2022-05-21 DIAGNOSIS — R159 Full incontinence of feces: Secondary | ICD-10-CM | POA: Diagnosis not present

## 2022-05-21 DIAGNOSIS — D631 Anemia in chronic kidney disease: Secondary | ICD-10-CM | POA: Diagnosis not present

## 2022-05-21 DIAGNOSIS — R339 Retention of urine, unspecified: Secondary | ICD-10-CM | POA: Diagnosis not present

## 2022-05-21 DIAGNOSIS — E876 Hypokalemia: Secondary | ICD-10-CM | POA: Diagnosis not present

## 2022-05-21 DIAGNOSIS — I132 Hypertensive heart and chronic kidney disease with heart failure and with stage 5 chronic kidney disease, or end stage renal disease: Secondary | ICD-10-CM | POA: Diagnosis not present

## 2022-05-21 DIAGNOSIS — Z466 Encounter for fitting and adjustment of urinary device: Secondary | ICD-10-CM | POA: Diagnosis not present

## 2022-05-21 DIAGNOSIS — E785 Hyperlipidemia, unspecified: Secondary | ICD-10-CM | POA: Diagnosis not present

## 2022-05-21 DIAGNOSIS — E43 Unspecified severe protein-calorie malnutrition: Secondary | ICD-10-CM | POA: Diagnosis not present

## 2022-05-21 DIAGNOSIS — F411 Generalized anxiety disorder: Secondary | ICD-10-CM | POA: Diagnosis not present

## 2022-05-21 DIAGNOSIS — D72819 Decreased white blood cell count, unspecified: Secondary | ICD-10-CM | POA: Diagnosis not present

## 2022-05-21 DIAGNOSIS — I480 Paroxysmal atrial fibrillation: Secondary | ICD-10-CM | POA: Diagnosis not present

## 2022-05-21 DIAGNOSIS — D61818 Other pancytopenia: Secondary | ICD-10-CM | POA: Diagnosis not present

## 2022-05-21 DIAGNOSIS — I700 Atherosclerosis of aorta: Secondary | ICD-10-CM | POA: Diagnosis not present

## 2022-05-22 DIAGNOSIS — E876 Hypokalemia: Secondary | ICD-10-CM | POA: Diagnosis not present

## 2022-05-22 DIAGNOSIS — Z466 Encounter for fitting and adjustment of urinary device: Secondary | ICD-10-CM | POA: Diagnosis not present

## 2022-05-22 DIAGNOSIS — R339 Retention of urine, unspecified: Secondary | ICD-10-CM | POA: Diagnosis not present

## 2022-05-22 DIAGNOSIS — E785 Hyperlipidemia, unspecified: Secondary | ICD-10-CM | POA: Diagnosis not present

## 2022-05-22 DIAGNOSIS — D631 Anemia in chronic kidney disease: Secondary | ICD-10-CM | POA: Diagnosis not present

## 2022-05-22 DIAGNOSIS — R159 Full incontinence of feces: Secondary | ICD-10-CM | POA: Diagnosis not present

## 2022-05-22 DIAGNOSIS — E43 Unspecified severe protein-calorie malnutrition: Secondary | ICD-10-CM | POA: Diagnosis not present

## 2022-05-22 DIAGNOSIS — I700 Atherosclerosis of aorta: Secondary | ICD-10-CM | POA: Diagnosis not present

## 2022-05-22 DIAGNOSIS — D61818 Other pancytopenia: Secondary | ICD-10-CM | POA: Diagnosis not present

## 2022-05-22 DIAGNOSIS — F411 Generalized anxiety disorder: Secondary | ICD-10-CM | POA: Diagnosis not present

## 2022-05-22 DIAGNOSIS — I132 Hypertensive heart and chronic kidney disease with heart failure and with stage 5 chronic kidney disease, or end stage renal disease: Secondary | ICD-10-CM | POA: Diagnosis not present

## 2022-05-22 DIAGNOSIS — D72819 Decreased white blood cell count, unspecified: Secondary | ICD-10-CM | POA: Diagnosis not present

## 2022-05-22 DIAGNOSIS — I480 Paroxysmal atrial fibrillation: Secondary | ICD-10-CM | POA: Diagnosis not present

## 2022-05-28 DIAGNOSIS — I132 Hypertensive heart and chronic kidney disease with heart failure and with stage 5 chronic kidney disease, or end stage renal disease: Secondary | ICD-10-CM | POA: Diagnosis not present

## 2022-05-28 DIAGNOSIS — Z466 Encounter for fitting and adjustment of urinary device: Secondary | ICD-10-CM | POA: Diagnosis not present

## 2022-05-28 DIAGNOSIS — E43 Unspecified severe protein-calorie malnutrition: Secondary | ICD-10-CM | POA: Diagnosis not present

## 2022-05-28 DIAGNOSIS — D72819 Decreased white blood cell count, unspecified: Secondary | ICD-10-CM | POA: Diagnosis not present

## 2022-05-28 DIAGNOSIS — I480 Paroxysmal atrial fibrillation: Secondary | ICD-10-CM | POA: Diagnosis not present

## 2022-05-28 DIAGNOSIS — D631 Anemia in chronic kidney disease: Secondary | ICD-10-CM | POA: Diagnosis not present

## 2022-05-28 DIAGNOSIS — D61818 Other pancytopenia: Secondary | ICD-10-CM | POA: Diagnosis not present

## 2022-05-28 DIAGNOSIS — I700 Atherosclerosis of aorta: Secondary | ICD-10-CM | POA: Diagnosis not present

## 2022-05-28 DIAGNOSIS — E785 Hyperlipidemia, unspecified: Secondary | ICD-10-CM | POA: Diagnosis not present

## 2022-05-28 DIAGNOSIS — R159 Full incontinence of feces: Secondary | ICD-10-CM | POA: Diagnosis not present

## 2022-05-28 DIAGNOSIS — R339 Retention of urine, unspecified: Secondary | ICD-10-CM | POA: Diagnosis not present

## 2022-05-28 DIAGNOSIS — F411 Generalized anxiety disorder: Secondary | ICD-10-CM | POA: Diagnosis not present

## 2022-05-28 DIAGNOSIS — E876 Hypokalemia: Secondary | ICD-10-CM | POA: Diagnosis not present

## 2022-05-29 DIAGNOSIS — I132 Hypertensive heart and chronic kidney disease with heart failure and with stage 5 chronic kidney disease, or end stage renal disease: Secondary | ICD-10-CM | POA: Diagnosis not present

## 2022-05-29 DIAGNOSIS — Z466 Encounter for fitting and adjustment of urinary device: Secondary | ICD-10-CM | POA: Diagnosis not present

## 2022-05-29 DIAGNOSIS — F411 Generalized anxiety disorder: Secondary | ICD-10-CM | POA: Diagnosis not present

## 2022-05-29 DIAGNOSIS — E785 Hyperlipidemia, unspecified: Secondary | ICD-10-CM | POA: Diagnosis not present

## 2022-05-29 DIAGNOSIS — D61818 Other pancytopenia: Secondary | ICD-10-CM | POA: Diagnosis not present

## 2022-05-29 DIAGNOSIS — I700 Atherosclerosis of aorta: Secondary | ICD-10-CM | POA: Diagnosis not present

## 2022-05-29 DIAGNOSIS — D72819 Decreased white blood cell count, unspecified: Secondary | ICD-10-CM | POA: Diagnosis not present

## 2022-05-29 DIAGNOSIS — E876 Hypokalemia: Secondary | ICD-10-CM | POA: Diagnosis not present

## 2022-05-29 DIAGNOSIS — D631 Anemia in chronic kidney disease: Secondary | ICD-10-CM | POA: Diagnosis not present

## 2022-05-29 DIAGNOSIS — I480 Paroxysmal atrial fibrillation: Secondary | ICD-10-CM | POA: Diagnosis not present

## 2022-05-29 DIAGNOSIS — E43 Unspecified severe protein-calorie malnutrition: Secondary | ICD-10-CM | POA: Diagnosis not present

## 2022-05-29 DIAGNOSIS — R339 Retention of urine, unspecified: Secondary | ICD-10-CM | POA: Diagnosis not present

## 2022-05-29 DIAGNOSIS — R159 Full incontinence of feces: Secondary | ICD-10-CM | POA: Diagnosis not present

## 2022-05-31 DIAGNOSIS — D72819 Decreased white blood cell count, unspecified: Secondary | ICD-10-CM | POA: Diagnosis not present

## 2022-05-31 DIAGNOSIS — R159 Full incontinence of feces: Secondary | ICD-10-CM | POA: Diagnosis not present

## 2022-05-31 DIAGNOSIS — R339 Retention of urine, unspecified: Secondary | ICD-10-CM | POA: Diagnosis not present

## 2022-05-31 DIAGNOSIS — E785 Hyperlipidemia, unspecified: Secondary | ICD-10-CM | POA: Diagnosis not present

## 2022-05-31 DIAGNOSIS — F411 Generalized anxiety disorder: Secondary | ICD-10-CM | POA: Diagnosis not present

## 2022-05-31 DIAGNOSIS — D61818 Other pancytopenia: Secondary | ICD-10-CM | POA: Diagnosis not present

## 2022-05-31 DIAGNOSIS — Z466 Encounter for fitting and adjustment of urinary device: Secondary | ICD-10-CM | POA: Diagnosis not present

## 2022-05-31 DIAGNOSIS — D631 Anemia in chronic kidney disease: Secondary | ICD-10-CM | POA: Diagnosis not present

## 2022-05-31 DIAGNOSIS — I132 Hypertensive heart and chronic kidney disease with heart failure and with stage 5 chronic kidney disease, or end stage renal disease: Secondary | ICD-10-CM | POA: Diagnosis not present

## 2022-05-31 DIAGNOSIS — E876 Hypokalemia: Secondary | ICD-10-CM | POA: Diagnosis not present

## 2022-05-31 DIAGNOSIS — I480 Paroxysmal atrial fibrillation: Secondary | ICD-10-CM | POA: Diagnosis not present

## 2022-05-31 DIAGNOSIS — E43 Unspecified severe protein-calorie malnutrition: Secondary | ICD-10-CM | POA: Diagnosis not present

## 2022-05-31 DIAGNOSIS — I700 Atherosclerosis of aorta: Secondary | ICD-10-CM | POA: Diagnosis not present

## 2022-06-02 ENCOUNTER — Encounter (HOSPITAL_COMMUNITY): Payer: Self-pay

## 2022-06-02 ENCOUNTER — Inpatient Hospital Stay (HOSPITAL_COMMUNITY)
Admission: EM | Admit: 2022-06-02 | Discharge: 2022-06-17 | DRG: 291 | Disposition: A | Discharge status: Home Health | Payer: Medicare (Managed Care) | Attending: Internal Medicine | Admitting: Internal Medicine

## 2022-06-02 ENCOUNTER — Emergency Department (HOSPITAL_COMMUNITY): Payer: Medicare (Managed Care)

## 2022-06-02 DIAGNOSIS — U071 COVID-19: Secondary | ICD-10-CM | POA: Diagnosis not present

## 2022-06-02 DIAGNOSIS — R778 Other specified abnormalities of plasma proteins: Secondary | ICD-10-CM | POA: Diagnosis not present

## 2022-06-02 DIAGNOSIS — R5381 Other malaise: Secondary | ICD-10-CM | POA: Diagnosis present

## 2022-06-02 DIAGNOSIS — I110 Hypertensive heart disease with heart failure: Secondary | ICD-10-CM | POA: Diagnosis not present

## 2022-06-02 DIAGNOSIS — I160 Hypertensive urgency: Secondary | ICD-10-CM | POA: Diagnosis not present

## 2022-06-02 DIAGNOSIS — I5030 Unspecified diastolic (congestive) heart failure: Secondary | ICD-10-CM | POA: Diagnosis present

## 2022-06-02 DIAGNOSIS — T83511A Infection and inflammatory reaction due to indwelling urethral catheter, initial encounter: Secondary | ICD-10-CM | POA: Diagnosis not present

## 2022-06-02 DIAGNOSIS — K5900 Constipation, unspecified: Secondary | ICD-10-CM | POA: Diagnosis not present

## 2022-06-02 DIAGNOSIS — R7401 Elevation of levels of liver transaminase levels: Secondary | ICD-10-CM | POA: Diagnosis not present

## 2022-06-02 DIAGNOSIS — R4702 Dysphasia: Secondary | ICD-10-CM | POA: Diagnosis not present

## 2022-06-02 DIAGNOSIS — R0600 Dyspnea, unspecified: Secondary | ICD-10-CM | POA: Diagnosis not present

## 2022-06-02 DIAGNOSIS — D631 Anemia in chronic kidney disease: Secondary | ICD-10-CM | POA: Diagnosis present

## 2022-06-02 DIAGNOSIS — I248 Other forms of acute ischemic heart disease: Secondary | ICD-10-CM | POA: Diagnosis present

## 2022-06-02 DIAGNOSIS — R54 Age-related physical debility: Secondary | ICD-10-CM | POA: Diagnosis present

## 2022-06-02 DIAGNOSIS — Z6829 Body mass index (BMI) 29.0-29.9, adult: Secondary | ICD-10-CM

## 2022-06-02 DIAGNOSIS — E876 Hypokalemia: Secondary | ICD-10-CM | POA: Diagnosis not present

## 2022-06-02 DIAGNOSIS — I5033 Acute on chronic diastolic (congestive) heart failure: Secondary | ICD-10-CM

## 2022-06-02 DIAGNOSIS — E038 Other specified hypothyroidism: Secondary | ICD-10-CM | POA: Diagnosis present

## 2022-06-02 DIAGNOSIS — E785 Hyperlipidemia, unspecified: Secondary | ICD-10-CM | POA: Diagnosis not present

## 2022-06-02 DIAGNOSIS — R0602 Shortness of breath: Secondary | ICD-10-CM | POA: Diagnosis not present

## 2022-06-02 DIAGNOSIS — N179 Acute kidney failure, unspecified: Secondary | ICD-10-CM | POA: Diagnosis present

## 2022-06-02 DIAGNOSIS — N184 Chronic kidney disease, stage 4 (severe): Secondary | ICD-10-CM | POA: Diagnosis present

## 2022-06-02 DIAGNOSIS — N189 Chronic kidney disease, unspecified: Secondary | ICD-10-CM | POA: Diagnosis present

## 2022-06-02 DIAGNOSIS — I5043 Acute on chronic combined systolic (congestive) and diastolic (congestive) heart failure: Secondary | ICD-10-CM | POA: Diagnosis not present

## 2022-06-02 DIAGNOSIS — I10 Essential (primary) hypertension: Secondary | ICD-10-CM | POA: Diagnosis not present

## 2022-06-02 DIAGNOSIS — I480 Paroxysmal atrial fibrillation: Secondary | ICD-10-CM | POA: Diagnosis present

## 2022-06-02 DIAGNOSIS — E8779 Other fluid overload: Secondary | ICD-10-CM | POA: Diagnosis not present

## 2022-06-02 DIAGNOSIS — N185 Chronic kidney disease, stage 5: Secondary | ICD-10-CM | POA: Diagnosis not present

## 2022-06-02 DIAGNOSIS — E8809 Other disorders of plasma-protein metabolism, not elsewhere classified: Secondary | ICD-10-CM | POA: Diagnosis present

## 2022-06-02 DIAGNOSIS — J90 Pleural effusion, not elsewhere classified: Secondary | ICD-10-CM | POA: Diagnosis not present

## 2022-06-02 DIAGNOSIS — R531 Weakness: Secondary | ICD-10-CM | POA: Diagnosis not present

## 2022-06-02 DIAGNOSIS — N1832 Chronic kidney disease, stage 3b: Secondary | ICD-10-CM | POA: Diagnosis not present

## 2022-06-02 DIAGNOSIS — I130 Hypertensive heart and chronic kidney disease with heart failure and stage 1 through stage 4 chronic kidney disease, or unspecified chronic kidney disease: Principal | ICD-10-CM | POA: Diagnosis present

## 2022-06-02 DIAGNOSIS — I5042 Chronic combined systolic (congestive) and diastolic (congestive) heart failure: Secondary | ICD-10-CM | POA: Diagnosis not present

## 2022-06-02 DIAGNOSIS — E43 Unspecified severe protein-calorie malnutrition: Secondary | ICD-10-CM | POA: Diagnosis present

## 2022-06-02 DIAGNOSIS — D649 Anemia, unspecified: Secondary | ICD-10-CM | POA: Diagnosis present

## 2022-06-02 DIAGNOSIS — N390 Urinary tract infection, site not specified: Secondary | ICD-10-CM | POA: Diagnosis not present

## 2022-06-02 DIAGNOSIS — I440 Atrioventricular block, first degree: Secondary | ICD-10-CM | POA: Diagnosis present

## 2022-06-02 DIAGNOSIS — A419 Sepsis, unspecified organism: Secondary | ICD-10-CM | POA: Diagnosis not present

## 2022-06-02 DIAGNOSIS — Y846 Urinary catheterization as the cause of abnormal reaction of the patient, or of later complication, without mention of misadventure at the time of the procedure: Secondary | ICD-10-CM | POA: Diagnosis not present

## 2022-06-02 DIAGNOSIS — Z79899 Other long term (current) drug therapy: Secondary | ICD-10-CM | POA: Diagnosis not present

## 2022-06-02 DIAGNOSIS — R0603 Acute respiratory distress: Secondary | ICD-10-CM | POA: Diagnosis not present

## 2022-06-02 DIAGNOSIS — R600 Localized edema: Secondary | ICD-10-CM | POA: Diagnosis not present

## 2022-06-02 DIAGNOSIS — J811 Chronic pulmonary edema: Secondary | ICD-10-CM | POA: Diagnosis not present

## 2022-06-02 DIAGNOSIS — N049 Nephrotic syndrome with unspecified morphologic changes: Secondary | ICD-10-CM

## 2022-06-02 DIAGNOSIS — I509 Heart failure, unspecified: Secondary | ICD-10-CM | POA: Diagnosis not present

## 2022-06-02 DIAGNOSIS — J189 Pneumonia, unspecified organism: Secondary | ICD-10-CM | POA: Diagnosis not present

## 2022-06-02 DIAGNOSIS — Z888 Allergy status to other drugs, medicaments and biological substances status: Secondary | ICD-10-CM

## 2022-06-02 DIAGNOSIS — J9811 Atelectasis: Secondary | ICD-10-CM | POA: Diagnosis not present

## 2022-06-02 DIAGNOSIS — I5031 Acute diastolic (congestive) heart failure: Secondary | ICD-10-CM | POA: Diagnosis not present

## 2022-06-02 DIAGNOSIS — T502X5A Adverse effect of carbonic-anhydrase inhibitors, benzothiadiazides and other diuretics, initial encounter: Secondary | ICD-10-CM | POA: Diagnosis not present

## 2022-06-02 DIAGNOSIS — Z808 Family history of malignant neoplasm of other organs or systems: Secondary | ICD-10-CM

## 2022-06-02 LAB — COMPREHENSIVE METABOLIC PANEL
ALT: 41 U/L (ref 0–44)
AST: 52 U/L — ABNORMAL HIGH (ref 15–41)
Albumin: 1.6 g/dL — ABNORMAL LOW (ref 3.5–5.0)
Alkaline Phosphatase: 152 U/L — ABNORMAL HIGH (ref 38–126)
Anion gap: 8 (ref 5–15)
BUN: 78 mg/dL — ABNORMAL HIGH (ref 8–23)
CO2: 23 mmol/L (ref 22–32)
Calcium: 7.3 mg/dL — ABNORMAL LOW (ref 8.9–10.3)
Chloride: 111 mmol/L (ref 98–111)
Creatinine, Ser: 4.05 mg/dL — ABNORMAL HIGH (ref 0.44–1.00)
GFR, Estimated: 11 mL/min — ABNORMAL LOW (ref >60)
Glucose, Bld: 82 mg/dL (ref 70–99)
Potassium: 3.8 mmol/L (ref 3.5–5.1)
Sodium: 142 mmol/L (ref 135–145)
Total Bilirubin: 0.4 mg/dL (ref 0.3–1.2)
Total Protein: 6 g/dL — ABNORMAL LOW (ref 6.5–8.1)

## 2022-06-02 LAB — CBC WITH DIFFERENTIAL/PLATELET
Abs Immature Granulocytes: 0.02 10*3/uL (ref 0.00–0.07)
Basophils Absolute: 0 10*3/uL (ref 0.0–0.1)
Basophils Relative: 1 %
Eosinophils Absolute: 0.1 10*3/uL (ref 0.0–0.5)
Eosinophils Relative: 2 %
HCT: 26.5 % — ABNORMAL LOW (ref 36.0–46.0)
Hemoglobin: 8.7 g/dL — ABNORMAL LOW (ref 12.0–15.0)
Immature Granulocytes: 0 %
Lymphocytes Relative: 28 %
Lymphs Abs: 1.4 10*3/uL (ref 0.7–4.0)
MCH: 30.4 pg (ref 26.0–34.0)
MCHC: 32.8 g/dL (ref 30.0–36.0)
MCV: 92.7 fL (ref 80.0–100.0)
Monocytes Absolute: 0.3 10*3/uL (ref 0.1–1.0)
Monocytes Relative: 5 %
Neutro Abs: 3.2 10*3/uL (ref 1.7–7.7)
Neutrophils Relative %: 64 %
Platelets: 175 10*3/uL (ref 150–400)
RBC: 2.86 MIL/uL — ABNORMAL LOW (ref 3.87–5.11)
RDW: 14.2 % (ref 11.5–15.5)
WBC: 5 10*3/uL (ref 4.0–10.5)
nRBC: 0 % (ref 0.0–0.2)

## 2022-06-02 LAB — I-STAT CHEM 8, ED
BUN: 84 mg/dL — ABNORMAL HIGH (ref 8–23)
Calcium, Ion: 1.01 mmol/L — ABNORMAL LOW (ref 1.15–1.40)
Chloride: 110 mmol/L (ref 98–111)
Creatinine, Ser: 4.1 mg/dL — ABNORMAL HIGH (ref 0.44–1.00)
Glucose, Bld: 76 mg/dL (ref 70–99)
HCT: 25 % — ABNORMAL LOW (ref 36.0–46.0)
Hemoglobin: 8.5 g/dL — ABNORMAL LOW (ref 12.0–15.0)
Potassium: 3.8 mmol/L (ref 3.5–5.1)
Sodium: 144 mmol/L (ref 135–145)
TCO2: 24 mmol/L (ref 22–32)

## 2022-06-02 LAB — TROPONIN I (HIGH SENSITIVITY)
Troponin I (High Sensitivity): 52 ng/L — ABNORMAL HIGH (ref <18)
Troponin I (High Sensitivity): 52 ng/L — ABNORMAL HIGH (ref <18)

## 2022-06-02 LAB — T4, FREE: Free T4: 0.91 ng/dL (ref 0.61–1.12)

## 2022-06-02 LAB — TSH: TSH: 4.591 u[IU]/mL — ABNORMAL HIGH (ref 0.350–4.500)

## 2022-06-02 LAB — PREALBUMIN: Prealbumin: 16 mg/dL — ABNORMAL LOW (ref 18–38)

## 2022-06-02 LAB — BRAIN NATRIURETIC PEPTIDE: B Natriuretic Peptide: 955.8 pg/mL — ABNORMAL HIGH (ref 0.0–100.0)

## 2022-06-02 MED ORDER — MUSCLE RUB 10-15 % EX CREA
1.0000 | TOPICAL_CREAM | CUTANEOUS | Status: DC | PRN
Start: 2022-06-02 — End: 2022-06-17

## 2022-06-02 MED ORDER — FUROSEMIDE 10 MG/ML IJ SOLN
60.0000 mg | Freq: Two times a day (BID) | INTRAMUSCULAR | Status: DC
  Administered 2022-06-02: 60 mg via INTRAVENOUS
  Filled 2022-06-02: qty 6

## 2022-06-02 MED ORDER — FUROSEMIDE 10 MG/ML IJ SOLN: 80.0000 mg | Freq: Two times a day (BID) | INTRAMUSCULAR | Status: DC

## 2022-06-02 MED ORDER — ACETAMINOPHEN 650 MG RE SUPP: 650.0000 mg | Freq: Four times a day (QID) | RECTAL | Status: DC | PRN

## 2022-06-02 MED ORDER — ALBUTEROL SULFATE (2.5 MG/3ML) 0.083% IN NEBU: 2.5000 mg | INHALATION_SOLUTION | RESPIRATORY_TRACT | Status: DC | PRN

## 2022-06-02 MED ORDER — HEPARIN SODIUM (PORCINE) 5000 UNIT/ML IJ SOLN
5000.0000 [IU] | Freq: Three times a day (TID) | INTRAMUSCULAR | Status: DC
  Administered 2022-06-02 – 2022-06-17 (×46): 5000 [IU] via SUBCUTANEOUS
  Filled 2022-06-02 (×46): qty 1

## 2022-06-02 MED ORDER — SODIUM CHLORIDE 0.9% FLUSH
3.0000 mL | Freq: Two times a day (BID) | INTRAVENOUS | Status: DC
  Administered 2022-06-02 – 2022-06-16 (×24): 3 mL via INTRAVENOUS

## 2022-06-02 MED ORDER — FUROSEMIDE 10 MG/ML IJ SOLN
80.0000 mg | Freq: Once | INTRAMUSCULAR | Status: AC
  Administered 2022-06-02: 80 mg via INTRAVENOUS
  Filled 2022-06-02: qty 8

## 2022-06-02 MED ORDER — NITROGLYCERIN 2 % TD OINT
1.0000 [in_us] | TOPICAL_OINTMENT | Freq: Once | TRANSDERMAL | Status: AC
  Administered 2022-06-02: 1 [in_us] via TOPICAL
  Filled 2022-06-02: qty 1

## 2022-06-02 MED ORDER — ACETAMINOPHEN 325 MG PO TABS: 650.0000 mg | ORAL_TABLET | Freq: Four times a day (QID) | ORAL | Status: DC | PRN

## 2022-06-02 MED ORDER — CARVEDILOL 6.25 MG PO TABS
6.2500 mg | ORAL_TABLET | Freq: Two times a day (BID) | ORAL | Status: DC
  Administered 2022-06-02 – 2022-06-17 (×26): 6.25 mg via ORAL
  Filled 2022-06-02 (×25): qty 1
  Filled 2022-06-02: qty 2
  Filled 2022-06-02 (×3): qty 1

## 2022-06-02 MED ORDER — SODIUM BICARBONATE 650 MG PO TABS
650.0000 mg | ORAL_TABLET | Freq: Two times a day (BID) | ORAL | Status: DC
  Administered 2022-06-02 – 2022-06-03 (×3): 650 mg via ORAL
  Filled 2022-06-02 (×3): qty 1

## 2022-06-02 MED ORDER — HYDRALAZINE HCL 20 MG/ML IJ SOLN: 10.0000 mg | INTRAMUSCULAR | Status: DC | PRN

## 2022-06-02 MED ORDER — CALCIUM GLUCONATE-NACL 2-0.675 GM/100ML-% IV SOLN
2.0000 g | Freq: Once | INTRAVENOUS | Status: AC
  Administered 2022-06-02: 2000 mg via INTRAVENOUS
  Filled 2022-06-02: qty 100

## 2022-06-02 MED ORDER — LOSARTAN POTASSIUM 25 MG PO TABS
25.0000 mg | ORAL_TABLET | Freq: Every day | ORAL | Status: DC
Start: 2022-06-02 — End: 2022-06-03
  Administered 2022-06-02 – 2022-06-03 (×2): 25 mg via ORAL
  Filled 2022-06-02 (×2): qty 1

## 2022-06-02 NOTE — ED Provider Notes (Signed)
[facility] EMERGENCY DEPARTMENT Provider Note   CSN: [id number] Arrival date & time: 06/02/22  0300     History  Chief Complaint  Patient presents with   Shortness of Breath    Kara Mullins is a 71 y.o. female.  The history is provided by the patient, the EMS personnel and medical records.  Shortness of Breath Kara Mullins is a 71 y.o. female who presents to the Emergency Department complaining of shortness of breath.  She presents to the emergency department by EMS from home for evaluation of shortness of breath.  She reports that over the last 2 nights she has been experiencing increasing shortness of breath.  This is waxing and waning but has not completely resolved.  She feels like she cannot catch her breath and has been running.  She has mild cough productive of clear sputum.  No fever, chest pain.  She does have chronic lower extremity edema and feels like this might be slightly worse than baseline.  She has a chronic Foley catheter and states she continues to make urine.  No reports of bloody stools.  She did have constipation yesterday.  Symptoms are moderate to severe, constant, worsening.  PCP did plan to increase her blood pressure medications 2 weeks ago but when she did increase her medications her blood pressure was low for her nephrology appointment and she was instructed to resume her previous dosing of blood pressure medication.     Home Medications Prior to Admission medications   Medication Sig Start Date End Date Taking? Authorizing Provider  Ascorbic Acid (VITAMIN C PO) Take 1 tablet by mouth daily. Crush and mix with applesauce    [provider]  carvedilol (COREG) 12.5 MG tablet TAKE 1 TABLET(12.5 MG) BY MOUTH TWICE DAILY WITH A MEAL Patient not taking: Reported on 04/17/2022 04/15/22   Donato Heinz, MD  carvedilol (COREG) 6.25 MG tablet Take 6.25 mg by mouth 2 (two) times daily. 01/20/22   [provider]  iron  polysaccharides (NIFEREX) 150 MG capsule Take 1 capsule (150 mg total) by mouth daily. 04/09/22 05/09/22  Darliss Cheney, MD  losartan (COZAAR) 25 MG tablet Take 25 mg by mouth daily. 01/15/22   [provider]  Multiple Vitamin (MULTIVITAMIN WITH MINERALS) TABS tablet Take 1 tablet by mouth daily. 11/24/21   Pokhrel, Corrie Mckusick, MD  sodium bicarbonate 650 MG tablet Take 1 tablet (650 mg total) by mouth 2 (two) times daily. 04/17/22 07/16/22  [name], [name], NP  torsemide 40 MG TABS Take 40 mg by mouth 2 (two) times daily. 04/08/22 05/08/22  Darliss Cheney, MD  Zinc 50 MG CAPS Take 1 capsule by mouth daily.    [provider]      Allergies    Chlorhexidine and Rosuvastatin    Review of Systems   Review of Systems  Respiratory:  Positive for shortness of breath.   All other systems reviewed and are negative.   Physical Exam Updated Vital Signs BP (!) 167/81   Pulse (!) 58   Temp (!) 97.5 F (36.4 C) (Oral)   Resp 12   Ht 5\' 5"  (1.651 m)   Wt 81.6 kg   SpO2 100%   BMI 29.94 kg/m  Physical Exam Vitals and nursing note reviewed.  Constitutional:      General: She is in acute distress.     Appearance: She is well-developed.  HENT:     Head: Normocephalic and atraumatic.  Cardiovascular:  Rate and Rhythm: Normal rate and regular rhythm.     Heart sounds: No murmur heard. Pulmonary:     Effort: No respiratory distress.     Comments: Tachypnea.  Decreased air movement in bilateral bases. Abdominal:     Palpations: Abdomen is soft.     Tenderness: There is no abdominal tenderness. There is no guarding or rebound.  Musculoskeletal:        General: No tenderness.     Comments: Ace wraps to bilateral lower extremities with pitting edema to bilateral lower extremities.  There is pitting edema up to the upper abdominal wall.  Skin:    General: Skin is warm and dry.  Neurological:     Mental Status: She is alert and oriented to person, place, and time.  Psychiatric:         Behavior: Behavior normal.     ED Results / Procedures / Treatments   Labs (all labs ordered are listed, but only abnormal results are displayed) Labs Reviewed  COMPREHENSIVE METABOLIC PANEL - Abnormal; Notable for the following components:      Result Value   BUN 78 (*)    Creatinine, Ser 4.05 (*)    Calcium 7.3 (*)    Total Protein 6.0 (*)    Albumin 1.6 (*)    AST 52 (*)    Alkaline Phosphatase 152 (*)    GFR, Estimated 11 (*)    All other components within normal limits  BRAIN NATRIURETIC PEPTIDE - Abnormal; Notable for the following components:   B Natriuretic Peptide 955.8 (*)    All other components within normal limits  CBC WITH DIFFERENTIAL/PLATELET - Abnormal; Notable for the following components:   RBC 2.86 (*)    Hemoglobin 8.7 (*)    HCT 26.5 (*)    All other components within normal limits  I-STAT CHEM 8, ED - Abnormal; Notable for the following components:   BUN 84 (*)    Creatinine, Ser 4.10 (*)    Calcium, Ion 1.01 (*)    Hemoglobin 8.5 (*)    HCT 25.0 (*)    All other components within normal limits  TROPONIN I (HIGH SENSITIVITY) - Abnormal; Notable for the following components:   Troponin I (High Sensitivity) 52 (*)    All other components within normal limits  TROPONIN I (HIGH SENSITIVITY) - Abnormal; Notable for the following components:   Troponin I (High Sensitivity) 52 (*)    All other components within normal limits  URINALYSIS, ROUTINE W REFLEX MICROSCOPIC    EKG EKG Interpretation  Date/Time:  Sunday June 02 2022 03:14:56 EDT Ventricular Rate:  62 PR Interval:  202 QRS Duration: 95 QT Interval:  463 QTC Calculation: 471 R Axis:   20 Text Interpretation: Sinus rhythm Probable left atrial enlargement Low voltage, extremity leads Confirmed by Quintella Reichert [phone]) on 06/02/2022 3:29:46 AM  Radiology DG Chest Port 1 View  Result Date: 06/02/2022 CLINICAL DATA:  Shortness of breath. EXAM: PORTABLE CHEST 1 VIEW COMPARISON:   03/27/2022. FINDINGS: Heart is enlarged and the mediastinal contour is within normal limits. There is atherosclerotic calcification of the aorta. Pulmonary vasculature is distended. Interstitial prominence is noted bilaterally. There small bilateral pleural effusions with mild airspace disease at the lung bases. No pneumothorax. No acute osseous abnormality. IMPRESSION: 1. Cardiomegaly with pulmonary vascular congestion. 2. Interstitial prominence bilaterally, suggesting edema. 3. Small bilateral pleural effusions with atelectasis, edema, or infiltrate. Electronically Signed   By: Brett Fairy M.D.   On: 06/02/2022 03:30  Procedures Procedures    Medications Ordered in ED Medications  acetaminophen (TYLENOL) tablet 650 mg (has no administration in time range)  furosemide (LASIX) injection 80 mg (80 mg Intravenous Given 06/02/22 0357)  nitroGLYCERIN (NITROGLYN) 2 % ointment 1 inch (1 inch Topical Given 06/02/22 0357)    ED Course/ Medical Decision Making/ A&P                           Medical Decision Making Amount and/or Complexity of Data Reviewed Labs: ordered. Radiology: ordered.  Risk OTC drugs. Prescription drug management. Decision regarding hospitalization.   Patient with history of hypertension, CHF, CKD here for evaluation of shortness of breath over the last 24 to 48 hours.  She is volume overloaded on examination with significant lower extremity and body wall edema.  She has decreased air movement in bilateral lung bases.  Chest x-ray with pulmonary vascular congestion and bilateral pleural effusions, images personally reviewed and interpreted, agree with radiologist interpretation.  Labs significant for AKI, elevation in BNP and troponin.  CBC with stable anemia.  She was treated with Lasix for diuresis.  She is on supplemental oxygen for comfort, no current hypoxia.  Plan to admit for ongoing treatment.  Patient updated of findings of studies recommendation for admission.   Hospitalist consulted for admission.       Final Clinical Impression(s) / ED Diagnoses Final diagnoses:  Acute congestive heart failure, unspecified heart failure type ([place])  AKI (acute kidney injury) [facility])    Rx / DC Orders ED Discharge Orders     None         Quintella Reichert, MD 06/02/22 [phone]

## 2022-06-02 NOTE — ED Triage Notes (Signed)
BIB [facility] with c/o shortness of breath. States it started Friday night but got better throughout the day, then got worse again tonight. 98% on RA, placed on 2L with EMS for comfort. Also hypertensive with EMS, hx of same - states they're adjusting her BP meds outpatient.   Chronic foley.

## 2022-06-02 NOTE — ED Notes (Signed)
ED TO INPATIENT HANDOFF REPORT  ED Nurse Name and Phone #: Mel Almond [phone]  S Name/Age/Gender Kara Mullins 71 y.o. female Room/Bed: 026C/026C  Code Status   Code Status: Full Code  Home/SNF/Other Home Patient oriented to: self, place, time, and situation Is this baseline? Yes   Triage Complete: Triage complete  Chief Complaint Fluid overload [E87.70]  Triage Note BIB [facility] with c/o shortness of breath. States it started Friday night but got better throughout the day, then got worse again tonight. 98% on RA, placed on 2L with EMS for comfort. Also hypertensive with EMS, hx of same - states they're adjusting her BP meds outpatient.   Chronic foley.    Allergies Allergies  Allergen Reactions   Crestor [Rosuvastatin] Other (See Comments)    "Felt like she couldn't swallow"   Peridex [Chlorhexidine Gluconate] Other (See Comments)    Unknown reaction     Level of Care/Admitting Diagnosis ED Disposition     ED Disposition  Admit   Condition  --   [place]: [facility] [[id number]]  Level of Care: Telemetry Medical [104]  May admit patient to [facility] or [facility] if equivalent level of care is available:: No  Covid Evaluation: Asymptomatic - no recent exposure (last 10 days) testing not required  Diagnosis: Fluid overload [341937]  Admitting Physician: Norval Morton [[id number]]  Attending Physician: Norval Morton [[id number]]  Certification:: I certify this patient will need inpatient services for at least 2 midnights  Estimated Length of Stay: 3          B Medical/Surgery History Past Medical History:  Diagnosis Date   Chronic combined systolic and diastolic congestive heart failure ([place]) 11/09/2021   Generalized anxiety disorder    Hyperlipidemia 11/09/2021   Hypertension    Stage 3b chronic kidney disease (CKD) ([place]) 11/09/2021   Past Surgical History:  Procedure Laterality Date   CATARACT EXTRACTION, BILATERAL      combined systolic and diastolic congestive heart failure        A IV Location/Drains/Wounds Patient Lines/Drains/Airways Status     Active Line/Drains/Airways     Name Placement date Placement time Site Days   Peripheral IV 06/02/22 20 G Left Antecubital 06/02/22  0314  Antecubital  less than 1   Urethral Catheter Verlene Mayer RN Straight-tip 14 Fr. 04/24/22  2136  Straight-tip  39   Pressure Injury 03/28/22 Sacrum Mid Stage 1 -  Intact skin with non-blanchable redness of a localized area usually over a bony prominence. Area is darker and non-blanchable. Intact, clean, dry. 03/28/22  0126  -- 66   Wound / Incision (Open or Dehisced) 03/13/21 Puncture Sacrum CT bone marrow biopsy 03/13/21  1543  Sacrum  446   Wound / Incision (Open or Dehisced) 11/12/21 (MASD) Moisture Associated Skin Damage Sacrum Left small area of moist, broken skin appearing moisture related 11/12/21  0900  Sacrum  202            Intake/Output Last 24 hours  Intake/Output Summary (Last 24 hours) at 06/02/2022 1455 Last data filed at 06/02/2022 1040 Gross per 24 hour  Intake 100 ml  Output --  Net 100 ml    Labs/Imaging Results for orders placed or performed during the hospital encounter of 06/02/22 (from the past 48 hour(s))  Comprehensive metabolic panel     Status: Abnormal   Collection Time: 06/02/22  3:11 AM  Result Value Ref Range   Sodium 142 135 - 145 mmol/L  Potassium 3.8 3.5 - 5.1 mmol/L   Chloride 111 98 - 111 mmol/L   CO2 23 22 - 32 mmol/L   Glucose, Bld 82 70 - 99 mg/dL    Comment: Glucose reference range applies only to samples taken after fasting for at least 8 hours.   BUN 78 (H) 8 - 23 mg/dL   Creatinine, Ser 4.05 (H) 0.44 - 1.00 mg/dL   Calcium 7.3 (L) 8.9 - 10.3 mg/dL   Total Protein 6.0 (L) 6.5 - 8.1 g/dL   Albumin 1.6 (L) 3.5 - 5.0 g/dL   AST 52 (H) 15 - 41 U/L   ALT 41 0 - 44 U/L   Alkaline Phosphatase 152 (H) 38 - 126 U/L   Total Bilirubin 0.4 0.3 - 1.2 mg/dL   GFR,  Estimated 11 (L) >60 mL/min    Comment: (NOTE) Calculated using the CKD-EPI Creatinine Equation (2021)    Anion gap 8 5 - 15    Comment: Performed at [facility], [place] [address], [place], [place] [address]  Brain natriuretic peptide     Status: Abnormal   Collection Time: 06/02/22  3:11 AM  Result Value Ref Range   B Natriuretic Peptide 955.8 (H) 0.0 - 100.0 pg/mL    Comment: Performed at [facility] [address] [place], [place] [address]  Troponin I (High Sensitivity)     Status: Abnormal   Collection Time: 06/02/22  3:11 AM  Result Value Ref Range   Troponin I (High Sensitivity) 52 (H) <18 ng/L    Comment: (NOTE) Elevated high sensitivity troponin I (hsTnI) values and significant  changes across serial measurements may suggest ACS but many other  chronic and acute conditions are known to elevate hsTnI results.  Refer to the "Links" section for chest pain algorithms and additional  guidance. Performed at [facility], [place] [address] [place], [place] [address]   CBC with Differential     Status: Abnormal   Collection Time: 06/02/22  3:11 AM  Result Value Ref Range   WBC 5.0 4.0 - 10.5 K/uL   RBC 2.86 (L) 3.87 - 5.11 MIL/uL   Hemoglobin 8.7 (L) 12.0 - 15.0 g/dL   HCT 26.5 (L) 36.0 - 46.0 %   MCV 92.7 80.0 - 100.0 fL   MCH 30.4 26.0 - 34.0 pg   MCHC 32.8 30.0 - 36.0 g/dL   RDW 14.2 11.5 - 15.5 %   Platelets 175 150 - 400 K/uL   nRBC 0.0 0.0 - 0.2 %   Neutrophils Relative % 64 %   Neutro Abs 3.2 1.7 - 7.7 K/uL   Lymphocytes Relative 28 %   Lymphs Abs 1.4 0.7 - 4.0 K/uL   Monocytes Relative 5 %   Monocytes Absolute 0.3 0.1 - 1.0 K/uL   Eosinophils Relative 2 %   Eosinophils Absolute 0.1 0.0 - 0.5 K/uL   Basophils Relative 1 %   Basophils Absolute 0.0 0.0 - 0.1 K/uL   Immature Granulocytes 0 %   Abs Immature Granulocytes 0.02 0.00 - 0.07 K/uL    Comment: Performed at [facility] [address], [place], [place] [address]  I-stat chem 8,  ED     Status: Abnormal   Collection Time: 06/02/22  3:37 AM  Result Value Ref Range   Sodium 144 135 - 145 mmol/L   Potassium 3.8 3.5 - 5.1 mmol/L   Chloride 110 98 - 111 mmol/L   BUN 84 (H) 8 -  23 mg/dL   Creatinine, Ser 4.10 (H) 0.44 - 1.00 mg/dL   Glucose, Bld 76 70 - 99 mg/dL    Comment: Glucose reference range applies only to samples taken after fasting for at least 8 hours.   Calcium, Ion 1.01 (L) 1.15 - 1.40 mmol/L   TCO2 24 22 - 32 mmol/L   Hemoglobin 8.5 (L) 12.0 - 15.0 g/dL   HCT 25.0 (L) 36.0 - 46.0 %  Troponin I (High Sensitivity)     Status: Abnormal   Collection Time: 06/02/22  5:01 AM  Result Value Ref Range   Troponin I (High Sensitivity) 52 (H) <18 ng/L    Comment: (NOTE) Elevated high sensitivity troponin I (hsTnI) values and significant  changes across serial measurements may suggest ACS but many other  chronic and acute conditions are known to elevate hsTnI results.  Refer to the "Links" section for chest pain algorithms and additional  guidance. Performed at [facility], [place] [address], [place], [place] [address]    DG Chest Port 1 View  Result Date: 06/02/2022 CLINICAL DATA:  Shortness of breath. EXAM: PORTABLE CHEST 1 VIEW COMPARISON:  03/27/2022. FINDINGS: Heart is enlarged and the mediastinal contour is within normal limits. There is atherosclerotic calcification of the aorta. Pulmonary vasculature is distended. Interstitial prominence is noted bilaterally. There small bilateral pleural effusions with mild airspace disease at the lung bases. No pneumothorax. No acute osseous abnormality. IMPRESSION: 1. Cardiomegaly with pulmonary vascular congestion. 2. Interstitial prominence bilaterally, suggesting edema. 3. Small bilateral pleural effusions with atelectasis, edema, or infiltrate. Electronically Signed   By: Brett Fairy M.D.   On: 06/02/2022 03:30    Pending Labs Unresulted Labs (From admission, onward)     Start     Ordered   06/03/22 0500   Renal function panel  Daily at 5am,   R      06/02/22 0742   06/03/22 0500  CBC  Tomorrow morning,   R        06/02/22 0742   06/03/22 0500  HIV Antibody (routine testing w rflx)  (HIV Antibody (Routine testing w reflex) panel)  Tomorrow morning,   R        06/02/22 0743   06/03/22 0500  Hepatic function panel  Tomorrow morning,   R        06/02/22 0759   06/02/22 0747  Prealbumin  Add-on,   AD        06/02/22 0747   06/02/22 0746  TSH  Add-on,   AD        06/02/22 0745   06/02/22 0746  T4, free  Add-on,   AD        06/02/22 0745   06/02/22 0422  Urinalysis, Routine w reflex microscopic  Once,   URGENT        06/02/22 0421            Vitals/Pain Today's Vitals   06/02/22 1215 06/02/22 1246 06/02/22 1315 06/02/22 1415  BP: (!) 146/73  (!) 147/72 126/68  Pulse: 78  (!) 52 (!) 51  Resp: 16  14 11   Temp:      TempSrc:      SpO2: 100%  100% 94%  Weight:      Height:      PainSc:  0-No pain      Isolation Precautions No active isolations  Medications Medications  heparin injection 5,000 Units (5,000 Units Subcutaneous Given 06/02/22 1429)  sodium chloride flush (NS) 0.9 %  injection 3 mL (3 mLs Intravenous Not Given 06/02/22 0903)  acetaminophen (TYLENOL) tablet 650 mg (has no administration in time range)    Or  acetaminophen (TYLENOL) suppository 650 mg (has no administration in time range)  albuterol (PROVENTIL) (2.5 MG/3ML) 0.083% nebulizer solution 2.5 mg (has no administration in time range)  hydrALAZINE (APRESOLINE) injection 10 mg (has no administration in time range)  losartan (COZAAR) tablet 25 mg (25 mg Oral Given 06/02/22 1050)  carvedilol (COREG) tablet 6.25 mg (6.25 mg Oral Given 06/02/22 1051)  sodium bicarbonate tablet 650 mg (650 mg Oral Given 06/02/22 1051)  Muscle Rub CREA 1 Application (has no administration in time range)  furosemide (LASIX) injection 60 mg (has no administration in time range)  furosemide (LASIX) injection 80 mg (80 mg Intravenous Given  06/02/22 0357)  nitroGLYCERIN (NITROGLYN) 2 % ointment 1 inch (1 inch Topical Given 06/02/22 0357)  calcium gluconate 2 g/ 100 mL sodium chloride IVPB (0 mg Intravenous Stopped 06/02/22 1040)    Mobility  Moderate fall risk   Focused Assessments Pulmonary Assessment Handoff:  Lung sounds:   O2 Device: Room Air, Nasal Cannula O2 Flow Rate (L/min): 2 L/min (Comfort)    R Recommendations: See Admitting Provider Note  Report given to:   Additional Notes: On hospital bed, daughter at bedside

## 2022-06-02 NOTE — H&P (Addendum)
History and Physical    Patient: Kara Mullins MBW:[id number] DOB: [birth year] DOA: 06/02/2022 DOS: the patient was seen and examined on 06/02/2022 PCP: [name], [name], NP  Patient coming from: Home with daughter via EMS  Chief Complaint:  Chief Complaint  Patient presents with   Shortness of Breath   HPI: Kara Mullins is a 71 y.o. female with medical history significant of hypertension, hyperlipidemia, heart failure with preserved EF, paroxysmal atrial fibrillation and CKD stage IV who presents with complaints of progressive worsening shortness of breath over the last 2 days.  She states it feels like she is running although she is not.  Patient had her Foley catheter exchanged out on 8/9, but they placed a 14-gauge instead of a 16.  Over the last week patient reported that her legs have become more swollen and she relates it to the change in her Foley catheter size.  She reports that she has been taking her medications as prescribed and trying to abide by dietary restrictions.  Associated symptoms include nonproductive cough, chest heaviness, orthopnea, and constipation.  She was able to have a bowel movement while in the emergency department.  Normally patient has limited mobility at baseline, but could stand with assistance for short period times.  However, due to lower extremity swelling she has been unable to get up. She was seen by Dr. Royce Macadamia of nephrology in the last week, and at that time her kidney function had been stable.  Denies having any significant fever, dysuria, nausea, vomiting, or reports of bleeding.  She has dark stools, but is on iron supplementation.  Her daughter acts as a primary caregiver, but she also has home health and physical therapy. In route with EMS patient was noted to be 98% on room air and placed on 2 L of oxygen for comfort.  Upon admission to the emergency department patient was noted to be afebrile with pulse 58-67, blood pressures elevated up to 211/93,  and O2 saturations currently maintained.  Labs significant for hemoglobin 8.7, potassium 3.8, BUN 78, creatinine 4.05, calcium 7.3, alkaline phosphatase 152, albumin 1.6, AST 52, BNP 955.8, and high-sensitivity troponins 52-> 52.  Chest x-ray revealed cardiomegaly with pulmonary vascular congestion with signs of pulmonary edema and small bilateral pleural effusions.  Review of Systems: As mentioned in the history of present illness. All other systems reviewed and are negative. Past Medical History:  Diagnosis Date   Chronic combined systolic and diastolic congestive heart failure ([place]) 11/09/2021   Generalized anxiety disorder    Hyperlipidemia 11/09/2021   Hypertension    Stage 3b chronic kidney disease (CKD) ([place]) 11/09/2021   Past Surgical History:  Procedure Laterality Date   CATARACT EXTRACTION, BILATERAL     combined systolic and diastolic congestive heart failure      Social History:  reports that she has never smoked. She has never used smokeless tobacco. She reports that she does not currently use alcohol. She reports that she does not use drugs.  Allergies  Allergen Reactions   Chlorhexidine Other (See Comments)    Unknown reaction   Rosuvastatin Other (See Comments)    "Felt like she couldn't swallow"    Family History  Problem Relation Age of Onset   Throat cancer Father     Prior to Admission medications   Medication Sig Start Date End Date Taking? Authorizing Provider  Ascorbic Acid (VITAMIN C PO) Take 1 tablet by mouth daily. Crush and mix with applesauce    [provider]  carvedilol (COREG) 12.5 MG tablet TAKE 1 TABLET(12.5 MG) BY MOUTH TWICE DAILY WITH A MEAL Patient not taking: Reported on 04/17/2022 04/15/22   Donato Heinz, MD  carvedilol (COREG) 6.25 MG tablet Take 6.25 mg by mouth 2 (two) times daily. 01/20/22   [provider]  iron polysaccharides (NIFEREX) 150 MG capsule Take 1 capsule (150 mg total) by mouth daily. 04/09/22 05/09/22   Darliss Cheney, MD  losartan (COZAAR) 25 MG tablet Take 25 mg by mouth daily. 01/15/22   [provider]  Multiple Vitamin (MULTIVITAMIN WITH MINERALS) TABS tablet Take 1 tablet by mouth daily. 11/24/21   Pokhrel, Corrie Mckusick, MD  sodium bicarbonate 650 MG tablet Take 1 tablet (650 mg total) by mouth 2 (two) times daily. 04/17/22 07/16/22  [name], [name], NP  torsemide 40 MG TABS Take 40 mg by mouth 2 (two) times daily. 04/08/22 05/08/22  Darliss Cheney, MD  Zinc 50 MG CAPS Take 1 capsule by mouth daily.    [provider]    Physical Exam: Vitals:   06/02/22 0415 06/02/22 0430 06/02/22 0500 06/02/22 0545  BP: (!) 184/75 (!) 197/127 (!) 172/79 (!) 167/81  Pulse: (!) 59 67 (!) 58 (!) 58  Resp: 14 18 15 12   Temp:      TempSrc:      SpO2: 100% 100% 100% 100%  Weight:      Height:        Constitutional: Female who appears to be in no acute distress at this time Eyes: PERRL, lids and conjunctivae normal ENMT: Mucous membranes are moist.    Neck: normal, supple, JVD present Respiratory: Normal respiratory effort with crackles appreciated in both lung fields.  Currently on 2 L nasal cannula oxygen and able to talk in complete sentences. Cardiovascular: Regular rate and rhythm, no murmurs / rubs / gallops.  3 pitting bilateral lower extremity edema. Abdomen: no tenderness, no masses palpated.    Bowel sounds positive.  Musculoskeletal: no cyanosis.  Able to move Skin: no rashes, lesions, or ulcers.   Neurologic: CN 2-12 grossly intact.  Strength 5/5 in both upper extremities, 3-4/5 in the right lower extremity, and 4/5 in the left lower extremity. Psychiatric: Normal judgment and insight. Alert and oriented x 3. Normal mood.    Data Reviewed:  EKG reveals sinus rhythm at 62 bpm with first-degree heart block and suspected left atrial enlargement.  Reviewed labs, imaging, and pertinent records as noted above in HPI.  Assessment and Plan: Acute respiratory distress secondary to  acute on chronic diastolic congestive heart failure exacerbation Patient presented with complaints of progressively worsening shortness of breath over the last day.  O2 saturations currently maintained on room air.  Chest x-ray noted cardiomegaly with concern for pulmonary edema and bilateral pleural effusions.  BNP was elevated at 955.8.  Last EF noted to be 55 to 60% with grade 2 diastolic dysfunction in 12/8180.  Patient has been given Lasix 80 mg IV. -Admit to a medical telemetry bed -Heart failure order set utilized -Strict I&Os daily weights -Elevate legs -Check TSH -Lasix 60 mg twice daily -Check echocardiogram -Consider need to formally consult cardiology if change in heart function appreciated.  Elevated troponin Acute on chronic.  High-sensitivity troponins 52-> 52, previously noted to be in the 20s earlier this year.  EKG without any significant changes.  Suspect secondary to demand in setting of fluid overload. -Follow up echocardiogram  Acute kidney injury superimposed on chronic kidney disease stage IV Patient presents with creatinine  elevated to 4.05 with BUN 78.  Her baseline creatinine previously had been around 3.  This is greater than 0.3 increased baseline to suggest acute kidney injury.  In the setting of congestive heart failure exacerbation suspect hypoperfusion as a likely cause.  Patient still able to make urine. -Follow-up urinalysis -Change Foley catheter to patient reports should be a 16-gauge -Continue sodium bicarb -Continue monitoring kidney function with diuresis -Consider formally consult nephrology if having issues with diuresing patient or worsening kidney function -Recommend continued outpatient follow-up with urology for catheter  Hypertensive urgency On admission blood pressures elevated to 202/81.  Home blood pressure regimen includes Coreg 6.25 mg twice daily with meals, losartan 25 mg daily, and torsemide 40 mg twice daily. -Continue Coreg and  losartan -Hydralazine IV as needed okay  Bilateral lower extremity edema anasarca  Patient reports acute worsening of bilateral lower extremity edema.  On physical exam patient with edema noted of the lower extremities up into the abdomen.  Suspect secondary to patient being fluid overloaded, but as she is sedimentary question possibility of blood clot.  Patient has chronically elevated D-dimers. -Check vascular Doppler ultrasound of the lower extremities  Paroxysmal atrial fibrillation Patient appears to be in sinus rhythm at this time not on anticoagulation.  Normocytic anemia Hemoglobin 8.7 which appears around patient's baseline.  MCV, MCH, and RDW within normal limits.  Patient is on iron supplementation.  Suspect likely related to chronic kidney disease as patient does not report any bleeding. -Continue iron supplementation on discharge -Recheck CBC in a.m.  Hypocalcemia Acute.  Calcium 7.3 with ionized calcium 1.01. -Give calcium gluconate 2 g IV -Continue to monitor and replace as needed  Subclinical hypothyroidism Previously patient was noted to have TSH six-point 0.74 on 6/12023 with free T4 at the lower limits of normal back in 12/2021. -Add on TSH and free T4  Transaminitis Acute.  Labs noted alkaline phosphatase 152 and AST 52.  Suspect acute elevation secondary to hepatic congestion in setting of CHF. -Recheck LFTs in a.m.  Hypoalbuminemia Chronic.  Albumin 1.6 on admission.  Previously documented to have severe protein calorie malnutrition. -Check prealbumin  Debility At baseline patient is only able to stand for short periods in time, but due to worsening lower extremity swelling had not been able to do so.  DVT prophylaxis: Heparin Advance Care Planning:   Code Status: Full Code  Consults: None  Family Communication: Daughter updated at bedside  Severity of Illness: The appropriate patient status for this patient is INPATIENT. Inpatient status is judged to  be reasonable and necessary in order to provide the required intensity of service to ensure the patient's safety. The patient's presenting symptoms, physical exam findings, and initial radiographic and laboratory data in the context of their chronic comorbidities is felt to place them at high risk for further clinical deterioration. Furthermore, it is not anticipated that the patient will be medically stable for discharge from the hospital within 2 midnights of admission.   * I certify that at the point of admission it is my clinical judgment that the patient will require inpatient hospital care spanning beyond 2 midnights from the point of admission due to high intensity of service, high risk for further deterioration and high frequency of surveillance required.*  Author: Norval Morton, MD 06/02/2022 7:25 AM  For on call review [email or web address].

## 2022-06-03 ENCOUNTER — Inpatient Hospital Stay (HOSPITAL_COMMUNITY): Payer: Medicare (Managed Care)

## 2022-06-03 DIAGNOSIS — I5031 Acute diastolic (congestive) heart failure: Secondary | ICD-10-CM | POA: Diagnosis not present

## 2022-06-03 DIAGNOSIS — N049 Nephrotic syndrome with unspecified morphologic changes: Secondary | ICD-10-CM | POA: Diagnosis not present

## 2022-06-03 DIAGNOSIS — N179 Acute kidney failure, unspecified: Secondary | ICD-10-CM | POA: Diagnosis not present

## 2022-06-03 DIAGNOSIS — I5033 Acute on chronic diastolic (congestive) heart failure: Secondary | ICD-10-CM | POA: Diagnosis not present

## 2022-06-03 DIAGNOSIS — R0603 Acute respiratory distress: Secondary | ICD-10-CM | POA: Diagnosis not present

## 2022-06-03 LAB — ECHOCARDIOGRAM COMPLETE
AR max vel: 2.64 cm2
AV Area VTI: 2.42 cm2
AV Area mean vel: 2.47 cm2
AV Mean grad: 4 mmHg
AV Peak grad: 7 mmHg
Ao pk vel: 1.32 m/s
Area-P 1/2: 3.03 cm2
Height: 65 in
MV M vel: 4.01 m/s
MV Peak grad: 64.3 mmHg
S' Lateral: 3.4 cm
Weight: 3350.99 oz

## 2022-06-03 LAB — RENAL FUNCTION PANEL
Albumin: <1.5 g/dL — ABNORMAL LOW (ref 3.5–5.0)
Anion gap: 9 (ref 5–15)
BUN: 79 mg/dL — ABNORMAL HIGH (ref 8–23)
CO2: 24 mmol/L (ref 22–32)
Calcium: 7.4 mg/dL — ABNORMAL LOW (ref 8.9–10.3)
Chloride: 110 mmol/L (ref 98–111)
Creatinine, Ser: 4.1 mg/dL — ABNORMAL HIGH (ref 0.44–1.00)
GFR, Estimated: 11 mL/min — ABNORMAL LOW (ref >60)
Glucose, Bld: 71 mg/dL (ref 70–99)
Phosphorus: 7.9 mg/dL — ABNORMAL HIGH (ref 2.5–4.6)
Potassium: 3.6 mmol/L (ref 3.5–5.1)
Sodium: 143 mmol/L (ref 135–145)

## 2022-06-03 LAB — HEPATIC FUNCTION PANEL
ALT: 35 U/L (ref 0–44)
AST: 42 U/L — ABNORMAL HIGH (ref 15–41)
Albumin: <1.5 g/dL — ABNORMAL LOW (ref 3.5–5.0)
Alkaline Phosphatase: 126 U/L (ref 38–126)
Bilirubin, Direct: <0.1 mg/dL (ref 0.0–0.2)
Total Bilirubin: 0.2 mg/dL — ABNORMAL LOW (ref 0.3–1.2)
Total Protein: 5.3 g/dL — ABNORMAL LOW (ref 6.5–8.1)

## 2022-06-03 LAB — HIV ANTIBODY (ROUTINE TESTING W REFLEX): HIV Screen 4th Generation wRfx: NONREACTIVE

## 2022-06-03 MED ORDER — METOPROLOL TARTRATE 5 MG/5ML IV SOLN: 5.0000 mg | INTRAVENOUS | Status: DC | PRN

## 2022-06-03 MED ORDER — FUROSEMIDE 10 MG/ML IJ SOLN
80.0000 mg | Freq: Two times a day (BID) | INTRAMUSCULAR | Status: DC
  Administered 2022-06-03: 80 mg via INTRAVENOUS
  Filled 2022-06-03: qty 8

## 2022-06-03 MED ORDER — SENNOSIDES-DOCUSATE SODIUM 8.6-50 MG PO TABS: 1.0000 | ORAL_TABLET | Freq: Every evening | ORAL | Status: DC | PRN

## 2022-06-03 MED ORDER — HYDRALAZINE HCL 20 MG/ML IJ SOLN: 10.0000 mg | INTRAMUSCULAR | Status: DC | PRN

## 2022-06-03 MED ORDER — HYDRALAZINE HCL 25 MG PO TABS
25.0000 mg | ORAL_TABLET | Freq: Three times a day (TID) | ORAL | Status: DC
  Administered 2022-06-03 – 2022-06-04 (×4): 25 mg via ORAL
  Filled 2022-06-03 (×4): qty 1

## 2022-06-03 MED ORDER — FUROSEMIDE 10 MG/ML IJ SOLN
160.0000 mg | Freq: Three times a day (TID) | INTRAVENOUS | Status: DC
  Administered 2022-06-03 – 2022-06-10 (×21): 160 mg via INTRAVENOUS
  Filled 2022-06-03: qty 16
  Filled 2022-06-03 (×2): qty 10
  Filled 2022-06-03: qty 14
  Filled 2022-06-03 (×5): qty 10
  Filled 2022-06-03: qty 16
  Filled 2022-06-03: qty 2
  Filled 2022-06-03: qty 16
  Filled 2022-06-03: qty 2
  Filled 2022-06-03: qty 4
  Filled 2022-06-03 (×3): qty 16
  Filled 2022-06-03 (×2): qty 10
  Filled 2022-06-03 (×2): qty 16
  Filled 2022-06-03: qty 10
  Filled 2022-06-03: qty 16

## 2022-06-03 MED ORDER — TRAZODONE HCL 50 MG PO TABS: 50.0000 mg | ORAL_TABLET | Freq: Every evening | ORAL | Status: DC | PRN

## 2022-06-03 MED ORDER — IPRATROPIUM-ALBUTEROL 0.5-2.5 (3) MG/3ML IN SOLN
3.0000 mL | RESPIRATORY_TRACT | Status: DC | PRN
  Filled 2022-06-03: qty 3

## 2022-06-03 MED ORDER — ALBUMIN HUMAN 25 % IV SOLN
25.0000 g | Freq: Two times a day (BID) | INTRAVENOUS | Status: AC
  Administered 2022-06-03 (×2): 25 g via INTRAVENOUS
  Filled 2022-06-03 (×2): qty 100

## 2022-06-03 MED ORDER — GUAIFENESIN 100 MG/5ML PO LIQD: 5.0000 mL | ORAL | Status: DC | PRN

## 2022-06-03 NOTE — Progress Notes (Signed)
Heart Failure Navigator Progress Note  Following this hospitalization to assess for HV TOC readiness.   Echo pending? Last EF 55-60 % (1/23) Creat 4.10  Earnestine Leys, BSN, RN Heart Failure Leisure centre manager Chat Only

## 2022-06-03 NOTE — Consult Note (Signed)
   [facility] Inpatient Consult   06/03/2022  Kara Mullins 02/19/1951 [id number]  [facility] [ACO] Patient: [employer]  Primary Care Provider:  [name], NP, with [facility]   Patient screened for hospitalization with noted extreme high risk score for unplanned readmission risk and to assess for potential [place] Management service needs for post hospital transition.  Review of patient's medical record reveals patient is from home with history of home health.   Met with patient and family member at the bedside o explain reason for visit for readmission prevention due to risk score. Patient was generous and verbalized understanding of visit. Gave an appointment reminder card with the 24 hour nurse advise line. Patient states she was interested in a devise like the STEDY for home use. I will share this with the inpatient team.  Plan:  Continue to follow progress and disposition to assess for post hospital care management needs.    For questions contact:   Natividad Brood, RN BSN [facility] Liaison  [phone] business mobile phone Toll free office [phone]  Fax number: [phone] [email or web address]

## 2022-06-03 NOTE — Progress Notes (Signed)
PROGRESS NOTE    Edmund Rick  AYT:[id number] DOB: 07/11/1951 DOA: 06/02/2022 PCP: [name], NP   Brief Narrative:  71 year old with history of HTN, HLD, CHF with preserved EF, paroxysmal A-fib, CKD stage IV admitted for worsening dyspnea on exertion over the past 2 days.  Follows with outpatient cardiology and nephrology.  Upon admission she was noted to have hypertensive urgency and signs of CHF exacerbation complicated by AKI on CKD stage IV.  She was started on IV diuretics.   Assessment & Plan:  Principal Problem:   (HFpEF) heart failure with preserved ejection fraction (HCC) Active Problems:   Acute respiratory distress   Elevated troponin level not due myocardial infarction   Acute kidney injury superimposed on chronic kidney disease ([place])   Hypertensive urgency   Anasarca associated with disorder of kidney   Bilateral lower extremity edema   Paroxysmal atrial fibrillation (HCC)   Anemia due to chronic kidney disease   Hyperlipidemia   Debility   Hypocalcemia   Subclinical hypothyroidism   Transaminitis   Acute respiratory distress Acute congestive heart failure with preserved ejection fraction, 60%.  Class IV Anasarca/left sided effusion. -Echo in January 2023 showed EF 55% with grade 2 DD.  Repeat echo ordered - Patient started on Lasix 60 mg IV twice daily.  Will give additional IV albumin 25% 25g q12hrs x 4 doses. - Echocardiogram- ef 55%, g1DD, large left effusion.  - Fluid restriction.  Monitor input and output -Lower extremity Dopplers ordered   Elevated troponin Remains flat and chest pain-free.  Likely from demand ischemia.   Acute kidney injury superimposed on chronic kidney disease stage IV -Baseline creatinine 3, admission creatinine 4.  Recently Foley catheter change.  Continue bicarb twice daily. Consult Nephrology.  - We will discontinue losartan   Hypertensive urgency; improved -Add Hydralazine 25mg  po tid.  We will continue Coreg, Lasix,  IV as needed hydralazine and Lopressor   Paroxysmal atrial fibrillation Normal sinus rhythm, not on anticoagulation   Normocytic anemia -No obvious evidence of bleeding.  Baseline hemoglobin 9.0   Hypocalcemia Repleted.   Subclinical hypothyroidism TSH slightly elevated but T4 normal.   Transaminitis Acute.  Labs noted alkaline phosphatase 152 and AST 52.  Suspect acute elevation secondary to hepatic congestion in setting of CHF. -Recheck LFTs in a.m.   Hypoalbuminemia Chronic malnutrition.  We will give IV albumin today   Debility Due to worsening swelling and pulmonary edema   DVT prophylaxis: SQ Heparin Code Status: Full code Family Communication:  Daughter at bedside  Status is: Inpatient Remains inpatient appropriate because: Still quite vol overloaded needds IV diuretics and nephro eval.     Subjective: Feeling ok, legs are quite swollen causing mobility issues.     Examination:  General exam: Appears calm and comfortable  Respiratory system: bibasilar crackles.  Cardiovascular system: S1 & S2 heard, RRR. No JVD, murmurs, rubs, gallops or clicks. 3+ b/l LE pitting edema. Compression dressing in place.  Gastrointestinal system: Abdomen is nondistended, soft and nontender. No organomegaly or masses felt. Normal bowel sounds heard. Central nervous system: Alert and oriented. No focal neurological deficits. Extremities: Symmetric 5 x 5 power. Skin: No rashes, lesions or ulcers Psychiatry: Judgement and insight appear normal. Mood & affect appropriate.     Objective: Vitals:   06/02/22 2100 06/02/22 2101 06/03/22 0344 06/03/22 0433  BP: (!) 179/88 (!) 179/88 (!) 156/81   Pulse:  (!) 58 (!) 58   Resp:  14 14   Temp:  97.7  F (36.5 C) 98.1 F (36.7 C)   TempSrc:  Oral Oral   SpO2:  100% 100%   Weight:    95 kg  Height:        Intake/Output Summary (Last 24 hours) at 06/03/2022 0733 Last data filed at 06/03/2022 0348 Gross per 24 hour  Intake 340 ml   Output 401 ml  Net -61 ml   Filed Weights   06/02/22 0303 06/03/22 0433  Weight: 81.6 kg 95 kg     Data Reviewed:   CBC: Recent Labs  Lab 06/02/22 0311 06/02/22 0337  WBC 5.0  --   NEUTROABS 3.2  --   HGB 8.7* 8.5*  HCT 26.5* 25.0*  MCV 92.7  --   PLT 175  --    Basic Metabolic Panel: Recent Labs  Lab 06/02/22 0311 06/02/22 0337  NA 142 144  K 3.8 3.8  CL 111 110  CO2 23  --   GLUCOSE 82 76  BUN 78* 84*  CREATININE 4.05* 4.10*  CALCIUM 7.3*  --    GFR: Estimated Creatinine Clearance: 14.8 mL/min (A) (by C-G formula based on SCr of 4.1 mg/dL (H)). Liver Function Tests: Recent Labs  Lab 06/02/22 0311  AST 52*  ALT 41  ALKPHOS 152*  BILITOT 0.4  PROT 6.0*  ALBUMIN 1.6*   No results for input(s): "LIPASE", "AMYLASE" in the last 168 hours. No results for input(s): "AMMONIA" in the last 168 hours. Coagulation Profile: No results for input(s): "INR", "PROTIME" in the last 168 hours. Cardiac Enzymes: No results for input(s): "CKTOTAL", "CKMB", "CKMBINDEX", "TROPONINI" in the last 168 hours. BNP (last 3 results) No results for input(s): "PROBNP" in the last 8760 hours. HbA1C: No results for input(s): "HGBA1C" in the last 72 hours. CBG: No results for input(s): "GLUCAP" in the last 168 hours. Lipid Profile: No results for input(s): "CHOL", "HDL", "LDLCALC", "TRIG", "CHOLHDL", "LDLDIRECT" in the last 72 hours. Thyroid Function Tests: Recent Labs    06/02/22 1554  TSH 4.591*  FREET4 0.91   Anemia Panel: No results for input(s): "VITAMINB12", "FOLATE", "FERRITIN", "TIBC", "IRON", "RETICCTPCT" in the last 72 hours. Sepsis Labs: No results for input(s): "PROCALCITON", "LATICACIDVEN" in the last 168 hours.  No results found for this or any previous visit (from the past 240 hour(s)).       Radiology Studies: DG Chest Port 1 View  Result Date: 06/02/2022 CLINICAL DATA:  Shortness of breath. EXAM: PORTABLE CHEST 1 VIEW COMPARISON:  03/27/2022.  FINDINGS: Heart is enlarged and the mediastinal contour is within normal limits. There is atherosclerotic calcification of the aorta. Pulmonary vasculature is distended. Interstitial prominence is noted bilaterally. There small bilateral pleural effusions with mild airspace disease at the lung bases. No pneumothorax. No acute osseous abnormality. IMPRESSION: 1. Cardiomegaly with pulmonary vascular congestion. 2. Interstitial prominence bilaterally, suggesting edema. 3. Small bilateral pleural effusions with atelectasis, edema, or infiltrate. Electronically Signed   By: Brett Fairy M.D.   On: 06/02/2022 03:30        Scheduled Meds:  carvedilol  6.25 mg Oral BID WC   furosemide  60 mg Intravenous BID   heparin  5,000 Units Subcutaneous Q8H   losartan  25 mg Oral Daily   sodium bicarbonate  650 mg Oral BID   sodium chloride flush  3 mL Intravenous Q12H   Continuous Infusions:   LOS: 1 day   Time spent= 35 mins    Aniruddh Ciavarella Arsenio Loader, MD [facility]  If 7PM-7AM, please contact night-coverage  06/03/2022, 7:33 AM

## 2022-06-03 NOTE — Progress Notes (Signed)
OT Cancellation Note  Patient Details Name: Kara Mullins MRN: [id number] DOB: 1951-04-26   Cancelled Treatment:    Reason Eval/Treat Not Completed: Patient at procedure or test/ unavailable (ECHO at bedside. Will reattempt as schedule permits)  Lynnda Child, OTD, OTR/L Acute Rehab [phone] - [phone]   Kaylyn Lim 06/03/2022, 10:28 AM

## 2022-06-03 NOTE — Consult Note (Signed)
Kara Mullins Admit Date: 06/02/2022 06/03/2022 Kara Mullins Requesting Physician:  Kara Chew MD  Reason for Consult:  AoCKD4, anasarca, nephrotic syndrome  HPI:  81F admitted overnight after presenting with recurrent progressive dyspnea, edema, nonproductive cough, orthopnea.  She has followed with [facility], she has advanced CKD with nephrotic range proteinuria with unclear cause, historically negative serologies.  There was concern for amyloid but bone marrow biopsy was nondiagnostic.  Other history includes hypertension, HFpEF, atrial fibrillation, chronic Foley catheter.  Presenting creatinine of 4.1, K3.8.  Creatinine was 3.1 earlier this month when she saw Dr. Royce Mullins at [facility].  Serum albumin remains low, less than 1.5.  She has been placed on Lasix 80 IV twice daily, albumin 25 g every 12 hours.  Her daughter is at bedside.  She has had some improvement of late, but her edema and dyspnea makes ambulation difficult.  She has previously been considered not a candidate for dialysis, but I find her to be a woman who is engaged, interested in improving, and perhaps her disease state which could be fixed is the biggest reason that she is not a candidate.   Creatinine (no units)  Date Value  04/22/2022 3.0 (A)   Creat (mg/dL)  Date Value  11/06/2021 4.06 (H)  08/27/2021 1.98 (H)  04/30/2021 1.41 (H)   Creatinine, Ser (mg/dL)  Date Value  06/03/2022 4.10 (H)  06/02/2022 4.10 (H)  06/02/2022 4.05 (H)  04/24/2022 3.15 (H)  04/08/2022 3.50 (H)  04/07/2022 3.47 (H)  04/06/2022 3.27 (H)  04/05/2022 3.33 (H)  04/04/2022 3.33 (H)  04/03/2022 3.28 (H)  ] I/Os: I/O last 3 completed shifts: In: 20 [P.O.:240; IV Piggyback:100] Out: 51 [Urine:400; Stool:1]   ROS NSAIDS: No exposure IV Contrast no exposure TMP/SMX no exposure Hypotension not present Balance of 12 systems is negative w/ exceptions as above  PMH  Past Medical History:  Diagnosis Date   Chronic combined  systolic and diastolic congestive heart failure (HCC) 11/09/2021   Generalized anxiety disorder    Hyperlipidemia 11/09/2021   Hypertension    Stage 3b chronic kidney disease (CKD) ([place]) 11/09/2021   PSH  Past Surgical History:  Procedure Laterality Date   CATARACT EXTRACTION, BILATERAL     combined systolic and diastolic congestive heart failure      FH  Family History  Problem Relation Age of Onset   Throat cancer Father    SH  reports that she has never smoked. She has never used smokeless tobacco. She reports that she does not currently use alcohol. She reports that she does not use drugs. Allergies  Allergies  Allergen Reactions   Crestor [Rosuvastatin] Other (See Comments)    "Felt like she couldn't swallow"   Peridex [Chlorhexidine Gluconate] Other (See Comments)    Unknown reaction    Home medications Prior to Admission medications   Medication Sig Start Date End Date Taking? Authorizing Provider  Ascorbic Acid (VITAMIN C PO) Take 1 tablet by mouth daily. Crush and mix with applesauce   Yes [provider]  carvedilol (COREG) 12.5 MG tablet TAKE 1 TABLET(12.5 MG) BY MOUTH TWICE DAILY WITH A MEAL Patient taking differently: Take 6.25 mg by mouth 2 (two) times daily with a meal. 04/15/22  Yes Donato Heinz, MD  Ferrous Sulfate (SLOW FE PO) Take 1 tablet by mouth daily.   Yes [provider]  losartan (COZAAR) 25 MG tablet Take 25 mg by mouth daily. 01/15/22  Yes [provider]  Menthol-Methyl Salicylate (MUSCLE RUB)  10-15 % CREA Apply 1 Application topically as needed for muscle pain.   Yes [provider]  Multiple Vitamin (MULTIVITAMIN WITH MINERALS) TABS tablet Take 1 tablet by mouth daily. 11/24/21  Yes Pokhrel, Laxman, MD  sodium bicarbonate 650 MG tablet Take 1 tablet (650 mg total) by mouth 2 (two) times daily. 04/17/22 07/16/22 Yes [name], [name], NP  torsemide 40 MG TABS Take 40 mg by mouth 2 (two) times daily. 04/08/22  07/20/22 Yes Pahwani, Einar Grad, MD  Zinc 50 MG CAPS Take 1 capsule by mouth daily.   Yes [provider]  Vitamin D, Ergocalciferol, (DRISDOL) 1.25 MG (50000 UNIT) CAPS capsule Take 50,000 Units by mouth once a week. 05/30/22   [provider]    Current Medications Scheduled Meds:  carvedilol  6.25 mg Oral BID WC   furosemide  80 mg Intravenous BID   heparin  5,000 Units Subcutaneous Q8H   hydrALAZINE  25 mg Oral Q8H   sodium chloride flush  3 mL Intravenous Q12H   Continuous Infusions:  albumin human 25 g (06/03/22 0843)   PRN Meds:.acetaminophen **OR** acetaminophen, guaiFENesin, hydrALAZINE, ipratropium-albuterol, metoprolol tartrate, Muscle Rub, senna-docusate, traZODone  CBC Recent Labs  Lab 06/02/22 0311 06/02/22 0337  WBC 5.0  --   NEUTROABS 3.2  --   HGB 8.7* 8.5*  HCT 26.5* 25.0*  MCV 92.7  --   PLT 175  --    Basic Metabolic Panel Recent Labs  Lab 06/02/22 0311 06/02/22 0337 06/03/22 0737  NA 142 144 143  K 3.8 3.8 3.6  CL 111 110 110  CO2 23  --  24  GLUCOSE 82 76 71  BUN 78* 84* 79*  CREATININE 4.05* 4.10* 4.10*  CALCIUM 7.3*  --  7.4*  PHOS  --   --  7.9*    Physical Exam   Blood pressure (!) 160/74, pulse 61, temperature 98 F (36.7 C), temperature source Oral, resp. rate 15, height 5' 5" (1.651 m), weight 95 kg, SpO2 100 %. GEN: Elderly, chronically ill-appearing lying in bed ENT: Poor dentition EYES: EOMI CV: Regular, normal S1 and S2, no rub PULM: Diminished in the bases, otherwise clear ABD: Soft, nontender SKIN: No rashes or lesions, legs are wrapped EXT: 3+ lower extremity edema  Assessment 73F progressive CKD, nephrotic syndrome of unclear etiology, recurrent volume overload/HFpEF  AoCKD4 with nephrotic syndrome Etiology unclear, serologies were negative, work-up for amyloid nondiagnostic Increase diuretics to Lasix 160 IV 3 times daily Continue albumin I think that she would be a candidate for a trial of dialysis,  especially if dialysis would improve her volume status; not yet indicated Nephrotic syndrome: As above.  Number biopsy Acute on chronic HFpEF, worsened by #1 and #2 Chronic Foley catheter, ? why  Plan As above, inc diuretics Cont albumin Na restriction Hold ARB Hold NaHCO3 Daily weights, Daily Renal Panel, Strict I/Os, Avoid nephrotoxins (NSAIDs, judicious IV Contrast) Will follow along   Kara Mullins  06/03/2022, 2:15 PM

## 2022-06-03 NOTE — Evaluation (Signed)
Physical Therapy Evaluation Patient Details Name: Kara Mullins MRN: [id number] DOB: July 14, 1951 Today's Date: 06/03/2022  History of Present Illness  Pt is 71 yo female who presented to [facility] on [facility]/22 with progressive SOB over past 2 days. PMH: HTN, HLD, HF, Afib, CKD IV  Clinical Impression  Pt admitted with above diagnosis. Pt received sitting up EOB with daughter present. Pt reports at home she was able to maintain standing only a few seconds due to heaviness in LE's and could not ambulate. Pt unable to lift LE"s and has increased pain R>L due to swelling as well as limited knee flexion. Pt stood to stedy with mod A +2 and was able to maintain standing with trunk flexed x3 mins. Pt was encouraged to be able to do this. At this time recommend return home with HHPT (had already been receiving).  Pt currently with functional limitations due to the deficits listed below (see PT Problem List). Pt will benefit from skilled PT to increase their independence and safety with mobility to allow discharge to the venue listed below.          Recommendations for follow up therapy are one component of a multi-disciplinary discharge planning process, led by the attending physician.  Recommendations may be updated based on patient status, additional functional criteria and insurance authorization.  Follow Up Recommendations Home health PT      Assistance Recommended at Discharge Frequent or constant Supervision/Assistance  Patient can return home with the following  Two people to help with walking and/or transfers;A lot of help with bathing/dressing/bathroom;Assist for transportation;Help with stairs or ramp for entrance;Assistance with cooking/housework    Equipment Recommendations Other (comment) (stedy (family to research))  Recommendations for Other Services  OT consult    Functional Status Assessment Patient has had a recent decline in their functional status and demonstrates the ability to make  significant improvements in function in a reasonable and predictable amount of time.     Precautions / Restrictions Precautions Precautions: Fall Restrictions Weight Bearing Restrictions: No      Mobility  Bed Mobility               General bed mobility comments: pt sitting EOB with daughter beside her propping R foot up    Transfers Overall transfer level: Needs assistance Equipment used: Ambulation equipment used Transfers: Sit to/from Stand Sit to Stand: Mod assist, +2 physical assistance           General transfer comment: bed pad used for power up at hips, daughter assisted, mod A +2 for power up. Min A +2 to stand from flaps of stedy    Ambulation/Gait               General Gait Details: pt unable to step feet in standing  Stairs            Wheelchair Mobility    Modified Rankin (Stroke Patients Only)       Balance Overall balance assessment: Needs assistance Sitting-balance support: No upper extremity supported, Feet supported Sitting balance-Leahy Scale: Good     Standing balance support: Bilateral upper extremity supported, Reliant on assistive device for balance Standing balance-Leahy Scale: Poor Standing balance comment: needed UE support and min A to maintain standing with hips flexed. Maintained standing in stedy x3 mins                             Pertinent Vitals/Pain Pain Assessment Pain Assessment:  Faces Faces Pain Scale: Hurts little more Pain Location: LE's Pain Descriptors / Indicators: Tightness, Sore Pain Intervention(s): Limited activity within patient's tolerance, Monitored during session    Home Living Family/patient expects to be discharged to:: Private residence Living Arrangements: Children Available Help at Discharge: Family;Available PRN/intermittently Type of Home: House Home Access: Ramped entrance       Home Layout: One level Home Equipment: Rollator (4 wheels);Wheelchair -  manual;BSC/3in1 Additional Comments: was sponge bathing, lives with daughter and husband, daughter works in the day, husband works at night    Prior Function Prior Level of Function : Needs assist             Mobility Comments: has been unable to ambulate since 4/23, only standing short time, LE swelling has been limitation ADLs Comments: daughter assisted with bathing for LB and dressing as needed.     Hand Dominance   Dominant Hand: Right    Extremity/Trunk Assessment   Upper Extremity Assessment Upper Extremity Assessment: Defer to OT evaluation    Lower Extremity Assessment Lower Extremity Assessment: Generalized weakness;RLE deficits/detail;LLE deficits/detail RLE Deficits / Details: increased swelling R>L, skin very tight ankle all the way to thighs and pt unable to lift RLE, hip flex 2-/5, knee ext 2-/5, knee flex limited by swelling to 70 degrees RLE Sensation: decreased light touch;decreased proprioception RLE Coordination: decreased gross motor LLE Deficits / Details: hip flex 2+/5, knee ext 3-/5, able to flex L knee to 85 deg LLE Sensation: decreased light touch;decreased proprioception LLE Coordination: decreased gross motor    Cervical / Trunk Assessment Cervical / Trunk Assessment: Normal  Communication   Communication: No difficulties  Cognition Arousal/Alertness: Awake/alert Behavior During Therapy: WFL for tasks assessed/performed Overall Cognitive Status: Within Functional Limits for tasks assessed                                          General Comments General comments (skin integrity, edema, etc.): daughter looking into getting stedy for home to increase pt's ability to mobilize.    Exercises     Assessment/Plan    PT Assessment Patient needs continued PT services  PT Problem List Decreased strength;Decreased range of motion;Decreased activity tolerance;Decreased balance;Decreased mobility;Decreased coordination;Decreased  knowledge of use of DME;Decreased knowledge of precautions;Cardiopulmonary status limiting activity;Impaired sensation;Obesity;Decreased skin integrity;Pain       PT Treatment Interventions DME instruction;Gait training;Functional mobility training;Therapeutic activities;Therapeutic exercise;Balance training;Neuromuscular re-education;Patient/family education    PT Goals (Current goals can be found in the Care Plan section)  Acute Rehab PT Goals Patient Stated Goal: return home, be able to walk PT Goal Formulation: With patient Time For Goal Achievement: 06/17/22 Potential to Achieve Goals: Good    Frequency Min 3X/week     Co-evaluation               AM-PAC PT "6 Clicks" Mobility  Outcome Measure Help needed turning from your back to your side while in a flat bed without using bedrails?: A Lot Help needed moving from lying on your back to sitting on the side of a flat bed without using bedrails?: A Lot Help needed moving to and from a bed to a chair (including a wheelchair)?: Total Help needed standing up from a chair using your arms (e.g., wheelchair or bedside chair)?: Total Help needed to walk in hospital room?: Total Help needed climbing 3-5 steps with a railing? : Total 6  Click Score: 8    End of Session   Activity Tolerance: Patient tolerated treatment well Patient left: in bed;with call bell/phone within reach;with family/visitor present (sitting EOB eating lunch) Nurse Communication: Mobility status PT Visit Diagnosis: Muscle weakness (generalized) (M62.81);Difficulty in walking, not elsewhere classified (R26.2)    Time: 4834-7583 PT Time Calculation (min) (ACUTE ONLY): 30 min   Charges:   PT Evaluation $PT Eval Moderate Complexity: 1 Mod PT Treatments $Therapeutic Activity: 8-22 mins        Leighton Roach, PT  Acute Rehab Services Secure chat preferred Office [place] 06/03/2022, 1:32 PM

## 2022-06-03 NOTE — Evaluation (Signed)
Occupational Therapy Evaluation Patient Details Name: Kara Mullins MRN: [id number] DOB: 1951/01/30 Today's Date: 06/03/2022   History of Present Illness Pt is 71 yo female who presented to [facility] on 06/02/22 with progressive SOB over past 2 days. PMH: HTN, HLD, HF, Afib, CKD IV   Clinical Impression   Pt needing assist at baseline for ADLs, reports she is normally in the bed/sits EOB during the day. Lives with daughter who assists with all ADLs/IADLs. Pt currently needing min-total A for ADLs, and mod A for bed mobility. Pt declining standing attempts after working with PT shortly prior to OT evaluation. Educated pt on elevation of BLE's to reduce edema, pt verbalized understanding. Pt presenting with impairments listed below, will follow acutely. Recommend HHOT at d/c.     Recommendations for follow up therapy are one component of a multi-disciplinary discharge planning process, led by the attending physician.  Recommendations may be updated based on patient status, additional functional criteria and insurance authorization.   Follow Up Recommendations  Home health OT    Assistance Recommended at Discharge Frequent or constant Supervision/Assistance  Patient can return home with the following Two people to help with walking and/or transfers;A lot of help with bathing/dressing/bathroom;Assistance with cooking/housework;Direct supervision/assist for financial management;Direct supervision/assist for medications management;Assist for transportation;Help with stairs or ramp for entrance    Functional Status Assessment  Patient has had a recent decline in their functional status and demonstrates the ability to make significant improvements in function in a reasonable and predictable amount of time.  Equipment Recommendations  None recommended by OT (pt has all needed DME)    Recommendations for Other Services PT consult     Precautions / Restrictions Precautions Precautions:  Fall Restrictions Weight Bearing Restrictions: No      Mobility Bed Mobility Overal bed mobility: Needs Assistance Bed Mobility: Supine to Sit, Sit to Supine     Supine to sit: Mod assist Sit to supine: Mod assist   General bed mobility comments: mod A for BLE mgmt    Transfers                   General transfer comment: pt declining after just getting up with PT      Balance Overall balance assessment: Needs assistance Sitting-balance support: No upper extremity supported, Feet supported Sitting balance-Leahy Scale: Good     Standing balance support: Bilateral upper extremity supported, Reliant on assistive device for balance Standing balance-Leahy Scale: Poor Standing balance comment: needed UE support and min A to maintain standing with hips flexed. Maintained standing in stedy x3 mins                           ADL either performed or assessed with clinical judgement   ADL Overall ADL's : Needs assistance/impaired Eating/Feeding: Modified independent   Grooming: Set up   Upper Body Bathing: Minimal assistance   Lower Body Bathing: Maximal assistance   Upper Body Dressing : Minimal assistance   Lower Body Dressing: Maximal assistance   Toilet Transfer: Maximal assistance;+2 for physical assistance;BSC/3in1   Toileting- Clothing Manipulation and Hygiene: Total assistance Toileting - Clothing Manipulation Details (indicate cue type and reason): catheter     Functional mobility during ADLs: Maximal assistance;+2 for physical assistance       Vision   Vision Assessment?: No apparent visual deficits     Perception     Praxis      Pertinent Vitals/Pain Pain Assessment Pain Assessment:  Faces Pain Score: 4  Faces Pain Scale: Hurts little more Pain Location: RLE, below knee Pain Descriptors / Indicators: Tightness, Sore Pain Intervention(s): Limited activity within patient's tolerance, Monitored during session, Repositioned      Hand Dominance Right   Extremity/Trunk Assessment Upper Extremity Assessment Upper Extremity Assessment: Generalized weakness (noted LUE edema,)   Lower Extremity Assessment Lower Extremity Assessment: Defer to PT evaluation RLE Deficits / Details: increased swelling R>L, skin very tight ankle all the way to thighs and pt unable to lift RLE, hip flex 2-/5, knee ext 2-/5, knee flex limited by swelling to 70 degrees RLE Sensation: decreased light touch;decreased proprioception RLE Coordination: decreased gross motor LLE Deficits / Details: hip flex 2+/5, knee ext 3-/5, able to flex L knee to 85 deg LLE Sensation: decreased light touch;decreased proprioception LLE Coordination: decreased gross motor   Cervical / Trunk Assessment Cervical / Trunk Assessment: Normal   Communication Communication Communication: No difficulties   Cognition Arousal/Alertness: Awake/alert Behavior During Therapy: WFL for tasks assessed/performed Overall Cognitive Status: Within Functional Limits for tasks assessed                                       General Comments  VSS on RA    Exercises     Shoulder Instructions      Home Living Family/patient expects to be discharged to:: Private residence Living Arrangements: Children Available Help at Discharge: Family;Available PRN/intermittently Type of Home: House Home Access: Ramped entrance     Home Layout: One level     Bathroom Shower/Tub: Teacher, early years/pre: Handicapped height     Home Equipment: Rollator (4 wheels);Wheelchair - manual;BSC/3in1   Additional Comments: was sponge bathing, lives with daughter and husband, daughter works in the day, husband works at night      Prior Functioning/Environment Prior Level of Function : Needs assist             Mobility Comments: has been unable to ambulate since 4/23, only standing short time, LE swelling has been limitation ADLs Comments: daughter  assisted with bathing for LB and dressing as needed.        OT Problem List: Decreased strength;Decreased range of motion;Impaired balance (sitting and/or standing);Decreased activity tolerance;Cardiopulmonary status limiting activity      OT Treatment/Interventions: Self-care/ADL training;Therapeutic exercise;DME and/or AE instruction;Therapeutic activities;Patient/family education;Balance training    OT Goals(Current goals can be found in the care plan section) Acute Rehab OT Goals Patient Stated Goal: none stated OT Goal Formulation: With patient Time For Goal Achievement: 06/17/22 Potential to Achieve Goals: Good ADL Goals Pt Will Perform Upper Body Bathing: with min assist;bed level Pt Will Perform Lower Body Bathing: with mod assist;bed level Additional ADL Goal #1: pt will perform bed mobility min A in prep for ADLs  OT Frequency: Min 2X/week    Co-evaluation              AM-PAC OT "6 Clicks" Daily Activity     Outcome Measure Help from another person eating meals?: None Help from another person taking care of personal grooming?: None Help from another person toileting, which includes using toliet, bedpan, or urinal?: Total Help from another person bathing (including washing, rinsing, drying)?: A Lot Help from another person to put on and taking off regular upper body clothing?: A Little Help from another person to put on and taking off regular lower body clothing?:  A Lot 6 Click Score: 16   End of Session Nurse Communication: Mobility status  Activity Tolerance: Patient tolerated treatment well Patient left: in bed;with call bell/phone within reach;with bed alarm set  OT Visit Diagnosis: Other abnormalities of gait and mobility (R26.89);Unsteadiness on feet (R26.81);Muscle weakness (generalized) (M62.81)                Time: 5697-9480 OT Time Calculation (min): 27 min Charges:  OT General Charges $OT Visit: 1 Visit OT Evaluation $OT Eval Moderate Complexity:  1 Mod OT Treatments $Therapeutic Activity: 8-22 mins  Lynnda Child, OTD, OTR/L Acute Rehab [phone]) [phone] - [place] 06/03/2022, 5:15 PM

## 2022-06-04 DIAGNOSIS — N049 Nephrotic syndrome with unspecified morphologic changes: Secondary | ICD-10-CM | POA: Diagnosis not present

## 2022-06-04 DIAGNOSIS — N179 Acute kidney failure, unspecified: Secondary | ICD-10-CM | POA: Diagnosis not present

## 2022-06-04 DIAGNOSIS — R0603 Acute respiratory distress: Secondary | ICD-10-CM | POA: Diagnosis not present

## 2022-06-04 DIAGNOSIS — I5033 Acute on chronic diastolic (congestive) heart failure: Secondary | ICD-10-CM | POA: Diagnosis not present

## 2022-06-04 LAB — RENAL FUNCTION PANEL
Albumin: 2 g/dL — ABNORMAL LOW (ref 3.5–5.0)
Anion gap: 12 (ref 5–15)
BUN: 79 mg/dL — ABNORMAL HIGH (ref 8–23)
CO2: 21 mmol/L — ABNORMAL LOW (ref 22–32)
Calcium: 7.3 mg/dL — ABNORMAL LOW (ref 8.9–10.3)
Chloride: 110 mmol/L (ref 98–111)
Creatinine, Ser: 4.11 mg/dL — ABNORMAL HIGH (ref 0.44–1.00)
GFR, Estimated: 11 mL/min — ABNORMAL LOW (ref >60)
Glucose, Bld: 90 mg/dL (ref 70–99)
Phosphorus: 7.5 mg/dL — ABNORMAL HIGH (ref 2.5–4.6)
Potassium: 3.4 mmol/L — ABNORMAL LOW (ref 3.5–5.1)
Sodium: 143 mmol/L (ref 135–145)

## 2022-06-04 LAB — CBC
HCT: 19.8 % — ABNORMAL LOW (ref 36.0–46.0)
Hemoglobin: 6.5 g/dL — CL (ref 12.0–15.0)
MCH: 30.1 pg (ref 26.0–34.0)
MCHC: 32.8 g/dL (ref 30.0–36.0)
MCV: 91.7 fL (ref 80.0–100.0)
Platelets: 132 10*3/uL — ABNORMAL LOW (ref 150–400)
RBC: 2.16 MIL/uL — ABNORMAL LOW (ref 3.87–5.11)
RDW: 14.2 % (ref 11.5–15.5)
WBC: 3.7 10*3/uL — ABNORMAL LOW (ref 4.0–10.5)
nRBC: 0 % (ref 0.0–0.2)

## 2022-06-04 LAB — HEMOGLOBIN AND HEMATOCRIT, BLOOD
HCT: 21.5 % — ABNORMAL LOW (ref 36.0–46.0)
HCT: 23 % — ABNORMAL LOW (ref 36.0–46.0)
Hemoglobin: 7 g/dL — ABNORMAL LOW (ref 12.0–15.0)
Hemoglobin: 7.7 g/dL — ABNORMAL LOW (ref 12.0–15.0)

## 2022-06-04 LAB — PREPARE RBC (CROSSMATCH)

## 2022-06-04 LAB — MAGNESIUM: Magnesium: 2.4 mg/dL (ref 1.7–2.4)

## 2022-06-04 MED ORDER — POTASSIUM CHLORIDE 20 MEQ PO PACK
40.0000 meq | PACK | Freq: Once | ORAL | Status: AC
  Administered 2022-06-04: 40 meq via ORAL
  Filled 2022-06-04: qty 2

## 2022-06-04 MED ORDER — HYDRALAZINE HCL 50 MG PO TABS
50.0000 mg | ORAL_TABLET | Freq: Three times a day (TID) | ORAL | Status: DC
  Administered 2022-06-04 – 2022-06-17 (×38): 50 mg via ORAL
  Filled 2022-06-04 (×40): qty 1

## 2022-06-04 MED ORDER — ALBUMIN HUMAN 25 % IV SOLN
25.0000 g | Freq: Two times a day (BID) | INTRAVENOUS | Status: AC
  Administered 2022-06-04 – 2022-06-06 (×4): 25 g via INTRAVENOUS
  Filled 2022-06-04 (×4): qty 100

## 2022-06-04 MED ORDER — SODIUM CHLORIDE 0.9% IV SOLUTION: Freq: Once | INTRAVENOUS | Status: AC

## 2022-06-04 NOTE — Progress Notes (Signed)
PT Cancellation Note  Patient Details Name: Kara Mullins MRN: [id number] DOB: May 15, 1951   Cancelled Treatment:    Reason Eval/Treat Not Completed: Medical issues which prohibited therapy (receiving blood,  very fatigued and lethargic). Will check on pt tomorrow.  Leighton Roach, PT  Acute Rehab Services Secure chat preferred Office [place] 06/04/2022, 3:20 PM

## 2022-06-04 NOTE — Progress Notes (Signed)
PROGRESS NOTE    Kara Mullins  VOZ:[id number] DOB: 07-21-1951 DOA: 06/02/2022 PCP: [name], NP   Brief Narrative:  71 year old with history of HTN, HLD, CHF with preserved EF, paroxysmal A-fib, CKD stage IV admitted for worsening dyspnea on exertion over the past 2 days.  Follows with outpatient cardiology and nephrology.  Upon admission she was noted to have hypertensive urgency and signs of CHF exacerbation complicated by AKI on CKD stage IV.  Started on high-dose IV diuretics Lasix.   Assessment & Plan:  Principal Problem:   (HFpEF) heart failure with preserved ejection fraction (HCC) Active Problems:   Acute respiratory distress   Elevated troponin level not due myocardial infarction   Acute kidney injury superimposed on chronic kidney disease ([place])   Hypertensive urgency   Anasarca associated with disorder of kidney   Bilateral lower extremity edema   Paroxysmal atrial fibrillation (HCC)   Anemia due to chronic kidney disease   Hyperlipidemia   Debility   Hypocalcemia   Subclinical hypothyroidism   Transaminitis   Acute respiratory distress Acute congestive heart failure with preserved ejection fraction, 60%.  Class IV Anasarca/left sided effusion. -Echo in January 2023 showed EF 55% with grade 2 DD.  - Continue high dose diuretic.  Low threshold to start HD if necessary - Echocardiogram- ef 55%, g1DD, large left effusion.  - Fluid restriction.  Monitor input and output  Anemia, likely from chronic disease - No obvious signs of blood loss.  Hemoglobin dropped down to 6.5, repeat hemoglobin 7.0.  Will transfuse 1 unit PRBC.   Elevated troponin Flat, chest pain-free.  Demand ischemia.   Acute kidney injury superimposed on chronic kidney disease stage IV -Baseline creatinine 3, admission creatinine 4.  Recently Foley catheter change.  Getting high-dose diuretics with albumin.  Phosphorus is up, will defer to nephro to manage this.  Unfortunately it appears  patient is heading towards dialysis - We will discontinue losartan   Essential hypertension, uncontrolled - Increase hydralazine 50 mg 3 times daily we will continue Coreg, Lasix, IV as needed hydralazine and Lopressor   Paroxysmal atrial fibrillation Normal sinus rhythm, not on anticoagulation   Hypocalcemia Repleted.   Subclinical hypothyroidism TSH slightly elevated but T4 normal.   Transaminitis Suspect from hepatic congestion.  Will monitor   Hypoalbuminemia Chronic malnutrition.  Getting IV albumin   Debility Due to worsening swelling and pulmonary edema   DVT prophylaxis: SQ Heparin Code Status: Full code Family Communication: Ex-husband at bedside  Status is: Inpatient Remains inpatient appropriate because: Still quite vol overloaded needds IV diuretics and nephro eval.     Subjective: Output Documented 560cc Overall patient feels well, does not have any new complaints at this time.  Examination: Constitutional: Not in acute distress. Elderly frail Respiratory: Clear to auscultation bilaterally Cardiovascular: Normal sinus rhythm, no rubs Abdomen: Nontender nondistended good bowel sounds Musculoskeletal: 3+ bilateral lower extremity swelling, compression dressing in place. Skin: No rashes seen Neurologic: CN 2-12 grossly intact.  And nonfocal Psychiatric: Normal judgment and insight. Alert and oriented x 3. Normal mood.   Objective: Vitals:   06/03/22 1900 06/04/22 0300 06/04/22 0500 06/04/22 0510  BP: (!) 172/71   (!) 187/90  Pulse: 63 62    Resp: 16  15 19   Temp: 97.8 F (36.6 C) (!) 97.4 F (36.3 C)  (!) 97.4 F (36.3 C)  TempSrc: Axillary Axillary  Oral  SpO2: 100% 99%    Weight:      Height:  Intake/Output Summary (Last 24 hours) at 06/04/2022 0722 Last data filed at 06/04/2022 0434 Gross per 24 hour  Intake 900.46 ml  Output 1400 ml  Net -499.54 ml   Filed Weights   06/02/22 0303 06/03/22 0433  Weight: 81.6 kg 95 kg      Data Reviewed:   CBC: Recent Labs  Lab 06/02/22 0311 06/02/22 0337 06/04/22 0449  WBC 5.0  --  3.7*  NEUTROABS 3.2  --   --   HGB 8.7* 8.5* 6.5*  HCT 26.5* 25.0* 19.8*  MCV 92.7  --  91.7  PLT 175  --  465*   Basic Metabolic Panel: Recent Labs  Lab 06/02/22 0311 06/02/22 0337 06/03/22 0737 06/04/22 0449  NA 142 144 143 143  K 3.8 3.8 3.6 3.4*  CL 111 110 110 110  CO2 23  --  24 21*  GLUCOSE 82 76 71 90  BUN 78* 84* 79* 79*  CREATININE 4.05* 4.10* 4.10* 4.11*  CALCIUM 7.3*  --  7.4* 7.3*  MG  --   --   --  2.4  PHOS  --   --  7.9* 7.5*   GFR: Estimated Creatinine Clearance: 14.7 mL/min (A) (by C-G formula based on SCr of 4.11 mg/dL (H)). Liver Function Tests: Recent Labs  Lab 06/02/22 0311 06/03/22 0737 06/04/22 0449  AST 52* 42*  --   ALT 41 35  --   ALKPHOS 152* 126  --   BILITOT 0.4 0.2*  --   PROT 6.0* 5.3*  --   ALBUMIN 1.6* <1.5*  <1.5* 2.0*   No results for input(s): "LIPASE", "AMYLASE" in the last 168 hours. No results for input(s): "AMMONIA" in the last 168 hours. Coagulation Profile: No results for input(s): "INR", "PROTIME" in the last 168 hours. Cardiac Enzymes: No results for input(s): "CKTOTAL", "CKMB", "CKMBINDEX", "TROPONINI" in the last 168 hours. BNP (last 3 results) No results for input(s): "PROBNP" in the last 8760 hours. HbA1C: No results for input(s): "HGBA1C" in the last 72 hours. CBG: No results for input(s): "GLUCAP" in the last 168 hours. Lipid Profile: No results for input(s): "CHOL", "HDL", "LDLCALC", "TRIG", "CHOLHDL", "LDLDIRECT" in the last 72 hours. Thyroid Function Tests: Recent Labs    06/02/22 1554  TSH 4.591*  FREET4 0.91   Anemia Panel: No results for input(s): "VITAMINB12", "FOLATE", "FERRITIN", "TIBC", "IRON", "RETICCTPCT" in the last 72 hours. Sepsis Labs: No results for input(s): "PROCALCITON", "LATICACIDVEN" in the last 168 hours.  No results found for this or any previous visit (from the past  240 hour(s)).       Radiology Studies: ECHOCARDIOGRAM COMPLETE  Result Date: 06/03/2022    ECHOCARDIOGRAM REPORT   Patient Name:   Kara Mullins Date of Exam: 06/03/2022 Medical Rec #:  [id number]      Height:       65.0 in Accession #:    [id number]     Weight:       209.4 lb Date of Birth:  10/18/1950     BSA:          2.017 m Patient Age:    34 years       BP:           156/81 mmHg Patient Gender: F              HR:           62 bpm. Exam Location:  Inpatient Procedure: 2D Echo, Cardiac Doppler and Color Doppler Indications:  CHF  History:        Patient has prior history of Echocardiogram examinations, most                 recent 11/10/2021. CHF; Risk Factors:Hypertension and                 Dyslipidemia.  Sonographer:    Memory Argue Referring Phys: [id number] RONDELL A SMITH IMPRESSIONS  1. Left ventricular ejection fraction, by estimation, is 55%. The left ventricle has normal function. The left ventricle has no regional wall motion abnormalities. The left ventricular internal cavity size was mildly dilated. There is moderate left ventricular hypertrophy. Left ventricular diastolic parameters are consistent with Grade I diastolic dysfunction (impaired relaxation).  2. Right ventricular systolic function is normal. The right ventricular size is normal. There is moderately elevated pulmonary artery systolic pressure.  3. Left atrial size was moderately dilated.  4. Large left effusion persists.  5. The mitral valve is abnormal. Trivial mitral valve regurgitation. No evidence of mitral stenosis.  6. The aortic valve is tricuspid. There is mild calcification of the aortic valve. There is mild thickening of the aortic valve. Aortic valve regurgitation is trivial. Aortic valve sclerosis is present, with no evidence of aortic valve stenosis.  7. The inferior vena cava is dilated in size with >50% respiratory variability, suggesting right atrial pressure of 8 mmHg. FINDINGS  Left Ventricle: Left ventricular  ejection fraction, by estimation, is 55%. The left ventricle has normal function. The left ventricle has no regional wall motion abnormalities. The left ventricular internal cavity size was mildly dilated. There is moderate left ventricular hypertrophy. Left ventricular diastolic parameters are consistent with Grade I diastolic dysfunction (impaired relaxation). Right Ventricle: The right ventricular size is normal. No increase in right ventricular wall thickness. Right ventricular systolic function is normal. There is moderately elevated pulmonary artery systolic pressure. The tricuspid regurgitant velocity is 2.90 m/s, and with an assumed right atrial pressure of 15 mmHg, the estimated right ventricular systolic pressure is 29.5 mmHg. Left Atrium: Left atrial size was moderately dilated. Right Atrium: Right atrial size was normal in size. Pericardium: Large left effusion persists. There is no evidence of pericardial effusion. Mitral Valve: The mitral valve is abnormal. There is mild thickening of the mitral valve leaflet(s). Mild mitral annular calcification. Trivial mitral valve regurgitation. No evidence of mitral valve stenosis. Tricuspid Valve: The tricuspid valve is normal in structure. Tricuspid valve regurgitation is mild . No evidence of tricuspid stenosis. Aortic Valve: The aortic valve is tricuspid. There is mild calcification of the aortic valve. There is mild thickening of the aortic valve. Aortic valve regurgitation is trivial. Aortic valve sclerosis is present, with no evidence of aortic valve stenosis. Aortic valve mean gradient measures 4.0 mmHg. Aortic valve peak gradient measures 7.0 mmHg. Aortic valve area, by VTI measures 2.42 cm. Pulmonic Valve: The pulmonic valve was normal in structure. Pulmonic valve regurgitation is not visualized. No evidence of pulmonic stenosis. Aorta: The aortic root is normal in size and structure. Venous: The inferior vena cava is dilated in size with greater than  50% respiratory variability, suggesting right atrial pressure of 8 mmHg. IAS/Shunts: No atrial level shunt detected by color flow Doppler. Additional Comments: There is pleural effusion in the left lateral region.  LEFT VENTRICLE PLAX 2D LVIDd:         4.90 cm   Diastology LVIDs:         3.40 cm   LV e' medial:  4.90 cm/s LV PW:         1.30 cm   LV E/e' medial:  15.1 LV IVS:        1.50 cm   LV e' lateral:   7.51 cm/s LVOT diam:     2.00 cm   LV E/e' lateral: 9.8 LV SV:         92 LV SV Index:   46 LVOT Area:     3.14 cm  RIGHT VENTRICLE RV S prime:     15.10 cm/s TAPSE (M-mode): 2.6 cm LEFT ATRIUM              Index        RIGHT ATRIUM           Index LA diam:        2.70 cm  1.34 cm/m   RA Area:     17.90 cm LA Vol (A2C):   105.0 ml 52.05 ml/m  RA Volume:   47.90 ml  23.74 ml/m LA Vol (A4C):   116.0 ml 57.50 ml/m LA Biplane Vol: 113.0 ml 56.02 ml/m  AORTIC VALVE AV Area (Vmax):    2.64 cm AV Area (Vmean):   2.47 cm AV Area (VTI):     2.42 cm AV Vmax:           132.00 cm/s AV Vmean:          94.000 cm/s AV VTI:            0.381 m AV Peak Grad:      7.0 mmHg AV Mean Grad:      4.0 mmHg LVOT Vmax:         111.00 cm/s LVOT Vmean:        73.900 cm/s LVOT VTI:          0.293 m LVOT/AV VTI ratio: 0.77  AORTA Ao Root diam: 3.70 cm Ao Asc diam:  3.20 cm MITRAL VALVE                TRICUSPID VALVE MV Area (PHT): 3.03 cm     TR Peak grad:   33.6 mmHg MV Decel Time: 250 msec     TR Vmax:        290.00 cm/s MR Peak grad: 64.3 mmHg MR Vmax:      401.00 cm/s   SHUNTS MV E velocity: 73.90 cm/s   Systemic VTI:  0.29 m MV A velocity: 122.00 cm/s  Systemic Diam: 2.00 cm MV E/A ratio:  0.61 Jenkins Rouge MD Electronically signed by Jenkins Rouge MD Signature Date/Time: 06/03/2022/11:27:15 AM    Final         Scheduled Meds:  carvedilol  6.25 mg Oral BID WC   heparin  5,000 Units Subcutaneous Q8H   hydrALAZINE  25 mg Oral Q8H   sodium chloride flush  3 mL Intravenous Q12H   Continuous Infusions:  furosemide  Stopped (06/03/22 2248)     LOS: 2 days   Time spent= 35 mins    Kara Mullins Arsenio Loader, MD [facility]  If 7PM-7AM, please contact night-coverage  06/04/2022, 7:22 AM

## 2022-06-04 NOTE — Progress Notes (Signed)
Admit: 06/02/2022 LOS: 2  7F progressive CKD, nephrotic syndrome of unclear etiology, recurrent volume overload/HFpEF  Subjective:  Stable SCr, SALb 2.0, K 3.4 1.4L UOP on high dose lasix BPs elevated, on RA I discussed with Dr. Royce Macadamia, we both are in agreement that if needed/indictaed a trial of dialysis would be reasonable; pt in agreement  08/21 0701 - 08/22 0700 In: 900.5 [P.O.:720; IV Piggyback:180.5] Out: 1400 [Urine:1400]  Filed Weights   06/02/22 0303 06/03/22 0433  Weight: 81.6 kg 95 kg    Scheduled Meds:  carvedilol  6.25 mg Oral BID WC   heparin  5,000 Units Subcutaneous Q8H   hydrALAZINE  50 mg Oral Q8H   sodium chloride flush  3 mL Intravenous Q12H   Continuous Infusions:  furosemide 160 mg (06/04/22 0912)   PRN Meds:.acetaminophen **OR** acetaminophen, guaiFENesin, hydrALAZINE, ipratropium-albuterol, metoprolol tartrate, Muscle Rub, senna-docusate, traZODone  Current Labs: reviewed    Physical Exam:  Blood pressure (!) 186/96, pulse 65, temperature (!) 97.3 F (36.3 C), resp. rate 12, height 5\' 5"  (1.651 m), weight 95 kg, SpO2 99 %. GEN: Elderly, chronically ill-appearing lying in bed ENT: Poor dentition EYES: EOMI CV: Regular, normal S1 and S2, no rub PULM: Diminished in the bases, otherwise clear ABD: Soft, nontender SKIN: No rashes or lesions, legs are wrapped EXT: 3+ lower extremity edema  A AoCKD4 with nephrotic syndrome Etiology unclear, serologies were negative, work-up for amyloid nondiagnostic Cont Lasix 160 IV 3 times daily Continue albumin If fails diuresis would begin HD Nephrotic syndrome: As above.  Never biopsy HTN / Acute on chronic HFpEF, as above Chronic Foley catheter, ? why  P Cont diuresis for another 24h Folow closely Daily weights, Daily Renal Panel, Strict I/Os, Avoid nephrotoxins (NSAIDs, judicious IV Contrast)  Medication Issues; Preferred narcotic agents for pain control are hydromorphone, fentanyl, and methadone.  Morphine should not be used.  Baclofen should be avoided Avoid oral sodium phosphate and magnesium citrate based laxatives / bowel preps    Pearson Grippe MD 06/04/2022, 10:23 AM  Recent Labs  Lab 06/02/22 0311 06/02/22 0337 06/03/22 0737 06/04/22 0449  NA 142 144 143 143  K 3.8 3.8 3.6 3.4*  CL 111 110 110 110  CO2 23  --  24 21*  GLUCOSE 82 76 71 90  BUN 78* 84* 79* 79*  CREATININE 4.05* 4.10* 4.10* 4.11*  CALCIUM 7.3*  --  7.4* 7.3*  PHOS  --   --  7.9* 7.5*   Recent Labs  Lab 06/02/22 0311 06/02/22 0337 06/04/22 0449 06/04/22 0750  WBC 5.0  --  3.7*  --   NEUTROABS 3.2  --   --   --   HGB 8.7* 8.5* 6.5* 7.0*  HCT 26.5* 25.0* 19.8* 21.5*  MCV 92.7  --  91.7  --   PLT 175  --  132*  --

## 2022-06-05 DIAGNOSIS — R0603 Acute respiratory distress: Secondary | ICD-10-CM | POA: Diagnosis not present

## 2022-06-05 DIAGNOSIS — N179 Acute kidney failure, unspecified: Secondary | ICD-10-CM | POA: Diagnosis not present

## 2022-06-05 DIAGNOSIS — I509 Heart failure, unspecified: Secondary | ICD-10-CM

## 2022-06-05 LAB — RENAL FUNCTION PANEL
Albumin: 2.4 g/dL — ABNORMAL LOW (ref 3.5–5.0)
Anion gap: 9 (ref 5–15)
BUN: 83 mg/dL — ABNORMAL HIGH (ref 8–23)
CO2: 22 mmol/L (ref 22–32)
Calcium: 7.5 mg/dL — ABNORMAL LOW (ref 8.9–10.3)
Chloride: 110 mmol/L (ref 98–111)
Creatinine, Ser: 4.3 mg/dL — ABNORMAL HIGH (ref 0.44–1.00)
GFR, Estimated: 11 mL/min — ABNORMAL LOW (ref >60)
Glucose, Bld: 64 mg/dL — ABNORMAL LOW (ref 70–99)
Phosphorus: 7.7 mg/dL — ABNORMAL HIGH (ref 2.5–4.6)
Potassium: 3.6 mmol/L (ref 3.5–5.1)
Sodium: 141 mmol/L (ref 135–145)

## 2022-06-05 LAB — BASIC METABOLIC PANEL
Anion gap: 7 (ref 5–15)
BUN: 81 mg/dL — ABNORMAL HIGH (ref 8–23)
CO2: 23 mmol/L (ref 22–32)
Calcium: 7.5 mg/dL — ABNORMAL LOW (ref 8.9–10.3)
Chloride: 111 mmol/L (ref 98–111)
Creatinine, Ser: 4.21 mg/dL — ABNORMAL HIGH (ref 0.44–1.00)
GFR, Estimated: 11 mL/min — ABNORMAL LOW (ref >60)
Glucose, Bld: 63 mg/dL — ABNORMAL LOW (ref 70–99)
Potassium: 3.6 mmol/L (ref 3.5–5.1)
Sodium: 141 mmol/L (ref 135–145)

## 2022-06-05 LAB — CBC
HCT: 21.6 % — ABNORMAL LOW (ref 36.0–46.0)
Hemoglobin: 7 g/dL — ABNORMAL LOW (ref 12.0–15.0)
MCH: 29.3 pg (ref 26.0–34.0)
MCHC: 32.4 g/dL (ref 30.0–36.0)
MCV: 90.4 fL (ref 80.0–100.0)
Platelets: 125 10*3/uL — ABNORMAL LOW (ref 150–400)
RBC: 2.39 MIL/uL — ABNORMAL LOW (ref 3.87–5.11)
RDW: 15.4 % (ref 11.5–15.5)
WBC: 4.3 10*3/uL (ref 4.0–10.5)
nRBC: 0 % (ref 0.0–0.2)

## 2022-06-05 LAB — TYPE AND SCREEN
ABO/RH(D): AB POS
Antibody Screen: NEGATIVE
Unit division: 0

## 2022-06-05 LAB — BPAM RBC
Blood Product Expiration Date: 202309192359
ISSUE DATE / TIME: 202308221414
Unit Type and Rh: 8400

## 2022-06-05 LAB — PHOSPHORUS: Phosphorus: 7.6 mg/dL — ABNORMAL HIGH (ref 2.5–4.6)

## 2022-06-05 LAB — MAGNESIUM: Magnesium: 2.4 mg/dL (ref 1.7–2.4)

## 2022-06-05 MED ORDER — METOLAZONE 5 MG PO TABS
5.0000 mg | ORAL_TABLET | Freq: Every day | ORAL | Status: AC
Start: 2022-06-05 — End: 2022-06-07
  Administered 2022-06-05 – 2022-06-07 (×3): 5 mg via ORAL
  Filled 2022-06-05 (×3): qty 1

## 2022-06-05 MED ORDER — CALCIUM ACETATE (PHOS BINDER) 667 MG PO CAPS
1334.0000 mg | ORAL_CAPSULE | Freq: Three times a day (TID) | ORAL | Status: DC
  Administered 2022-06-05 – 2022-06-07 (×5): 1334 mg via ORAL
  Filled 2022-06-05 (×5): qty 2

## 2022-06-05 MED ORDER — POTASSIUM CHLORIDE 20 MEQ PO PACK
40.0000 meq | PACK | Freq: Once | ORAL | Status: AC
  Administered 2022-06-05: 40 meq via ORAL
  Filled 2022-06-05: qty 2

## 2022-06-05 NOTE — Progress Notes (Signed)
Heart Failure Navigator Progress Note  Following this hospitalization to assess for HV TOC readiness.   Creat now 4.21 (increased) HGB 7.0 (decreased) EF 55 % HFpEF  Earnestine Leys, BSN, RN Heart Failure Transport planner Only

## 2022-06-05 NOTE — Progress Notes (Signed)
Occupational Therapy Treatment Patient Details Name: Kara Mullins MRN: [id number] DOB: 1951-09-11 Today's Date: 06/05/2022   History of present illness Pt is 71 yo female who presented to [facility] on 06/02/22 with progressive SOB over past 2 days. PMH: HTN, HLD, HF, Afib, CKD IV   OT comments  Pt progressing towards goals, reports just returning to supine from sitting EOB all morning, pt agreeable to bed level therex. Administered green theraband, pt able to complete BUE exercises with supervision while long sitting in bed. Pt presenting with impairments listed below, will follow acutely. Continue to recommend HHOT at d/c.   Recommendations for follow up therapy are one component of a multi-disciplinary discharge planning process, led by the attending physician.  Recommendations may be updated based on patient status, additional functional criteria and insurance authorization.    Follow Up Recommendations  Home health OT    Assistance Recommended at Discharge Frequent or constant Supervision/Assistance  Patient can return home with the following  Two people to help with walking and/or transfers;A lot of help with bathing/dressing/bathroom;Assistance with cooking/housework;Direct supervision/assist for financial management;Direct supervision/assist for medications management;Assist for transportation;Help with stairs or ramp for entrance   Equipment Recommendations  None recommended by OT;Other (comment) (pt has all needed DME)    Recommendations for Other Services PT consult    Precautions / Restrictions Precautions Precautions: Fall Restrictions Weight Bearing Restrictions: No       Mobility Bed Mobility               General bed mobility comments: pt just returning to bed from sitting EOB for hours    Transfers                   General transfer comment: deferred     Balance                                           ADL either performed or  assessed with clinical judgement   ADL Overall ADL's : Needs assistance/impaired Eating/Feeding: Modified independent Eating/Feeding Details (indicate cue type and reason): taking meds                         Toileting- Clothing Manipulation and Hygiene: Total assistance Toileting - Clothing Manipulation Details (indicate cue type and reason): catheter       General ADL Comments: pt declining ADLs this session    Extremity/Trunk Assessment Upper Extremity Assessment Upper Extremity Assessment: Generalized weakness (LUE edema)   Lower Extremity Assessment Lower Extremity Assessment: Defer to PT evaluation        Vision   Vision Assessment?: No apparent visual deficits   Perception Perception Perception: Not tested   Praxis Praxis Praxis: Not tested    Cognition Arousal/Alertness: Awake/alert Behavior During Therapy: WFL for tasks assessed/performed Overall Cognitive Status: Within Functional Limits for tasks assessed                                          Exercises Exercises: General Upper Extremity General Exercises - Upper Extremity Shoulder Flexion: AROM, Both, 10 reps, Seated, Theraband Theraband Level (Shoulder Flexion): Level 3 (Green) Shoulder Horizontal ABduction: AROM, Both, 10 reps, Seated, Theraband Theraband Level (Shoulder Horizontal Abduction): Level 3 (Green) Elbow Flexion: AROM, Both, 10 reps,  Seated, Theraband Theraband Level (Elbow Flexion): Level 3 (Green) Elbow Extension: AROM, Both, 10 reps, Seated, Theraband Theraband Level (Elbow Extension): Level 3 (Green)    Shoulder Instructions       General Comments VSS on RA    Pertinent Vitals/ Pain       Pain Assessment Pain Assessment: No/denies pain  Home Living                                          Prior Functioning/Environment              Frequency  Min 2X/week        Progress Toward Goals  OT Goals(current goals can now  be found in the care plan section)  Progress towards OT goals: Progressing toward goals  Acute Rehab OT Goals Patient Stated Goal: none stated OT Goal Formulation: With patient Time For Goal Achievement: 06/17/22 Potential to Achieve Goals: Good ADL Goals Pt Will Perform Upper Body Bathing: with min assist;bed level Pt Will Perform Lower Body Bathing: with mod assist;bed level Additional ADL Goal #1: pt will perform bed mobility min A in prep for ADLs  Plan Discharge plan remains appropriate;Frequency remains appropriate    Co-evaluation                 AM-PAC OT "6 Clicks" Daily Activity     Outcome Measure   Help from another person eating meals?: None Help from another person taking care of personal grooming?: None Help from another person toileting, which includes using toliet, bedpan, or urinal?: Total Help from another person bathing (including washing, rinsing, drying)?: A Lot Help from another person to put on and taking off regular upper body clothing?: A Little Help from another person to put on and taking off regular lower body clothing?: A Lot 6 Click Score: 16    End of Session Equipment Utilized During Treatment: Other (comment)  OT Visit Diagnosis: Other abnormalities of gait and mobility (R26.89);Unsteadiness on feet (R26.81);Muscle weakness (generalized) (M62.81)   Activity Tolerance Patient tolerated treatment well   Patient Left in bed;with call bell/phone within reach;with bed alarm set   Nurse Communication Mobility status        Time: 6948-5462 OT Time Calculation (min): 16 min  Charges: OT General Charges $OT Visit: 1 Visit OT Treatments $Therapeutic Activity: 8-22 mins  Lynnda Child, OTD, OTR/L Acute Rehab [phone] - [place] 06/05/2022, 3:09 PM

## 2022-06-05 NOTE — TOC Initial Note (Signed)
Transition of Care [facility]) - Initial/Assessment Note    Patient Details  Name: Kara Mullins MRN: [id number] Date of Birth: 05-14-1951  Transition of Care [facility]) CM/SW Contact:    Zenon Mayo, RN Phone Number: 06/05/2022, 4:46 PM  Clinical Narrative:                 Patient is from home, she is active with [facility] for [place], [place], [place], presents with chronic foley, low hgb, received prbc yesterday, cret up, she is a potential for dialysis trial.  TOC following.   Expected Discharge Plan: [place] Barriers to Discharge: Continued Medical Work up   Patient Goals and CMS Choice Patient states their goals for this hospitalization and ongoing recovery are:: return home      Expected Discharge Plan and Services Expected Discharge Plan: [place] In-house Referral: NA Discharge Planning Services: CM Consult Post Acute Care Choice: Home Health                     DME Agency: NA       HH Arranged: RN, PT, OT HH Agency: [facility]) Date HH Agency Contacted: 06/05/22 Time HH Agency Contacted: 58 Representative spoke with at [place]: Caryl Pina  Prior Living Arrangements/Services   Lives with:: Adult Children Patient language and need for interpreter reviewed:: Yes Do you feel safe going back to the place where you live?: Yes      Need for Family Participation in Patient Care: Yes (Comment) Care giver support system in place?: Yes (comment) Current home services: Home OT, Home PT, Home RN (rolling walker, chronic foley catheter) Criminal Activity/Legal Involvement Pertinent to Current Situation/Hospitalization: No - Comment as needed  Activities of Daily Living   ADL Screening (condition at time of admission) Patient's cognitive ability adequate to safely complete daily activities?: Yes Is the patient deaf or have difficulty hearing?: No Does the patient have difficulty seeing, even when wearing  glasses/contacts?: Yes  Permission Sought/Granted                  Emotional Assessment       Orientation: : Oriented to Self, Oriented to Place, Oriented to Situation, Oriented to  Time Alcohol / Substance Use: Not Applicable Psych Involvement: No (comment)  Admission diagnosis:  AKI (acute kidney injury) ([place]) [N17.9] Fluid overload [E87.70] Acute congestive heart failure, unspecified heart failure type ([place]) [I50.9] Patient Active Problem List   Diagnosis Date Noted   (HFpEF) heart failure with preserved ejection fraction ([place]) 06/02/2022   Acute respiratory distress 06/02/2022   Bilateral lower extremity edema 06/02/2022   Hypocalcemia 06/02/2022   Subclinical hypothyroidism 06/02/2022   Transaminitis 06/02/2022   Acute kidney injury superimposed on chronic kidney disease ([place]) 03/28/2022   Anasarca associated with disorder of kidney 03/27/2022   Leukocytopenia 03/27/2022   Aortic atherosclerosis ([place]) 03/27/2022   Pressure injury of skin 01/02/2022   Hypotension, unspecified 12/30/2021   Pneumonia due to COVID-19 virus 12/26/2021   Palliative care by specialist    Goals of care, counseling/discussion    General weakness    Sepsis ([place]) 12/22/2021   COVID-19 virus infection 12/22/2021   Paroxysmal atrial fibrillation ([place]) 12/22/2021   CKD (chronic kidney disease) stage 5, GFR less than 15 ml/min ([place]) 12/22/2021   Anemia due to chronic kidney disease 12/22/2021   Prolonged QT interval 12/22/2021   Debility 54/65/6812   Metabolic acidosis 75/17/0017   Severe sepsis ([place]) 11/19/2021  Hypothermia 11/09/2021   Hyperlipidemia 11/09/2021   Chronic combined systolic and diastolic congestive heart failure ([place]) 11/09/2021   Pancytopenia ([place]) 11/09/2021   Severe protein-calorie malnutrition ([place]) 11/09/2021   Acute combined systolic and diastolic HF (heart failure) ([place])    CHF exacerbation ([place]) 03/06/2021   Essential hypertension    Hypertensive urgency  03/05/2021   Acute CHF (congestive heart failure) ([place]) 03/05/2021   Nausea and vomiting 03/05/2021   Elevated troponin level not due myocardial infarction 03/05/2021   Elevated d-dimer 03/05/2021   Generalized anxiety disorder 03/05/2021   Acute congestive heart failure ([place]) 03/05/2021   PCP:  [name], NP Pharmacy:   [facility] [place], [address] [place] [place] [address] Phone: [phone] Fax: [phone]     Social Determinants of Health (SDOH) Interventions    Readmission Risk Interventions    06/05/2022    4:41 PM 12/23/2021   11:01 AM  Readmission Risk Prevention Plan  Transportation Screening Complete Complete  PCP or Specialist Appt within 3-5 Days  Complete  HRI or [place]  Complete  Social Work Consult for [place] Planning/Counseling  Complete  Palliative Care Screening  Not Applicable  Medication Review Press photographer) Complete Complete  HRI or [place] Complete   SW Recovery Care/Counseling Consult Complete   Palliative Care Screening Not [place] Not Applicable

## 2022-06-05 NOTE — Progress Notes (Signed)
Physical Therapy Treatment Patient Details Name: Kara Mullins MRN: [id number] DOB: 1951-03-30 Today's Date: 06/05/2022   History of Present Illness Pt is 71 yo female who presented to [facility] on 06/02/22 with progressive SOB over past 2 days. PMH: HTN, HLD, HF, Afib, CKD IV    PT Comments    Pt received seated EOB and agreeable to session with continued progress towards acute goals. Pt able to come to standing in stedy frame x5 throughout session with up to mod assist from EOB to power up down to min assist from stedy seat. Pt able to maintain static standing up to 60 seconds with cues throughout to elevate trunk as pt with tendency for anterior flexed posture, with pt able to correct. Pt with fair tolerance for seated LE therex for increased ROM and strength. Plan to continue to progress standing tolerance and transfers as pt able. Pt continues to benefit from skilled PT services to progress toward functional mobility goals.    Recommendations for follow up therapy are one component of a multi-disciplinary discharge planning process, led by the attending physician.  Recommendations may be updated based on patient status, additional functional criteria and insurance authorization.  Follow Up Recommendations  Home health PT     Assistance Recommended at Discharge Frequent or constant Supervision/Assistance  Patient can return home with the following Two people to help with walking and/or transfers;A lot of help with bathing/dressing/bathroom;Assist for transportation;Help with stairs or ramp for entrance;Assistance with cooking/housework   Equipment Recommendations  Other (comment) (stedy (family to research))    Recommendations for Other Services       Precautions / Restrictions Precautions Precautions: Fall Restrictions Weight Bearing Restrictions: No     Mobility  Bed Mobility               General bed mobility comments: pt sitting EOB pre and post session     Transfers Overall transfer level: Needs assistance Equipment used: Ambulation equipment used Transfers: Sit to/from Stand Sit to Stand: Mod assist, Min assist           General transfer comment: mod a to come to standing from EOB, min a to stand from stedy seat x4, able to maintain static standing in steady up to 60 seconds    Ambulation/Gait               General Gait Details: pt unable to step feet/march in place in standing   Stairs             Wheelchair Mobility    Modified Rankin (Stroke Patients Only)       Balance Overall balance assessment: Needs assistance Sitting-balance support: No upper extremity supported, Feet supported Sitting balance-Leahy Scale: Good     Standing balance support: Bilateral upper extremity supported, Reliant on assistive device for balance Standing balance-Leahy Scale: Poor Standing balance comment: needed UE support and min A to maintain standing with hips flexed. Maintained standing in stedy x3 mins                            Cognition Arousal/Alertness: Awake/alert Behavior During Therapy: WFL for tasks assessed/performed Overall Cognitive Status: Within Functional Limits for tasks assessed                                 General Comments: pt very plesant, cooperative and motivated  Exercises General Exercises - Lower Extremity Long Arc Quad: AROM, Right, Left, 10 reps, Seated Hip Flexion/Marching: AROM, Right, Left, 10 reps, Seated Heel Raises: AROM, Right, Left, 10 reps, Seated Mini-Sqauts: AROM, Right, Left, 10 reps, Seated    General Comments General comments (skin integrity, edema, etc.): VSS on RA      Pertinent Vitals/Pain Pain Assessment Pain Assessment: Faces Faces Pain Scale: Hurts a little bit Pain Location: LE's Pain Descriptors / Indicators: Tightness, Sore Pain Intervention(s): Monitored during session, Limited activity within patient's tolerance     Home Living                          Prior Function            PT Goals (current goals can now be found in the care plan section) Acute Rehab PT Goals Patient Stated Goal: return home, be able to walk PT Goal Formulation: With patient Time For Goal Achievement: 06/17/22    Frequency    Min 3X/week      PT Plan      Co-evaluation              AM-PAC PT "6 Clicks" Mobility   Outcome Measure  Help needed turning from your back to your side while in a flat bed without using bedrails?: A Lot Help needed moving from lying on your back to sitting on the side of a flat bed without using bedrails?: A Lot Help needed moving to and from a bed to a chair (including a wheelchair)?: Total Help needed standing up from a chair using your arms (e.g., wheelchair or bedside chair)?: Total Help needed to walk in hospital room?: Total Help needed climbing 3-5 steps with a railing? : Total 6 Click Score: 8    End of Session   Activity Tolerance: Patient tolerated treatment well Patient left: in bed;with call bell/phone within reach;with bed alarm set (sitting EOB) Nurse Communication: Mobility status PT Visit Diagnosis: Muscle weakness (generalized) (M62.81);Difficulty in walking, not elsewhere classified (R26.2)     Time: 2518-9842 PT Time Calculation (min) (ACUTE ONLY): 27 min  Charges:  $Therapeutic Exercise: 8-22 mins $Therapeutic Activity: 8-22 mins                     Sadeen Wiegel R. PTA Acute Rehabilitation Services Office: [place] 06/05/2022, 10:35 AM

## 2022-06-05 NOTE — Hospital Course (Signed)
71 year old with history of HTN, HLD, CHF with preserved EF, paroxysmal A-fib, CKD stage IV admitted for worsening dyspnea on exertion over the past 2 days.  Follows with outpatient cardiology and nephrology.  Upon admission she was noted to have hypertensive urgency and signs of CHF exacerbation complicated by AKI on CKD stage IV.  Started on high-dose IV diuretics Lasix.

## 2022-06-05 NOTE — Progress Notes (Signed)
Admit: 06/02/2022 LOS: 3  15F progressive CKD, nephrotic syndrome of unclear etiology, recurrent volume overload/HFpEF  Subjective:  SCr up a tad, K 3.6 2.1L UOP, not making quick progress on lasix 160 TID BPs elevated, on RA Feels better, but lets still very swollen.    08/22 0701 - 08/23 0700 In: 1212.8 [P.O.:360; Blood:630; IV Piggyback:222.8] Out: 2150 [Urine:2150]  Filed Weights   06/02/22 0303 06/03/22 0433 06/05/22 0432  Weight: 81.6 kg 95 kg 100.4 kg    Scheduled Meds:  carvedilol  6.25 mg Oral BID WC   heparin  5,000 Units Subcutaneous Q8H   hydrALAZINE  50 mg Oral Q8H   metolazone  5 mg Oral Daily   sodium chloride flush  3 mL Intravenous Q12H   Continuous Infusions:  albumin human 25 g (06/05/22 0506)   furosemide 160 mg (06/05/22 0948)   PRN Meds:.acetaminophen **OR** acetaminophen, guaiFENesin, hydrALAZINE, ipratropium-albuterol, metoprolol tartrate, Muscle Rub, senna-docusate, traZODone  Current Labs: reviewed    Physical Exam:  Blood pressure (!) 146/67, pulse 62, temperature (!) 97.5 F (36.4 C), temperature source Oral, resp. rate 17, height 5\' 5"  (1.651 m), weight 100.4 kg, SpO2 99 %. GEN: Elderly, chronically ill-appearing lying in bed ENT: Poor dentition EYES: EOMI CV: Regular, normal S1 and S2, no rub PULM: Diminished in the bases, otherwise clear ABD: Soft, nontender SKIN: No rashes or lesions, legs are wrapped EXT: 3+ lower extremity edema  A AoCKD4 with nephrotic syndrome Etiology unclear, serologies were negative, work-up for amyloid nondiagnostic Cont Lasix 160 IV 3 times daily, add metolazond 5mg /d x3d; give 40 mEq PO KCl today as well Continue albumin If fails diuresis would begin HD Nephrotic syndrome: As above.  Never biopsied HTN / Acute on chronic HFpEF, as above Chronic Foley catheter, ? Why Hyperphosphatemia  P Cont augmented diuresis for another 24h; as above Add Ca Acetate 2qAC Folow closely Daily weights, Daily Renal  Panel, Strict I/Os, Avoid nephrotoxins (NSAIDs, judicious IV Contrast)  Medication Issues; Preferred narcotic agents for pain control are hydromorphone, fentanyl, and methadone. Morphine should not be used.  Baclofen should be avoided Avoid oral sodium phosphate and magnesium citrate based laxatives / bowel preps    Pearson Grippe MD 06/05/2022, 10:35 AM  Recent Labs  Lab 06/03/22 0737 06/04/22 0449 06/05/22 0651  NA 143 143 141  141  K 3.6 3.4* 3.6  3.6  CL 110 110 111  110  CO2 24 21* 23  22  GLUCOSE 71 90 63*  64*  BUN 79* 79* 81*  83*  CREATININE 4.10* 4.11* 4.21*  4.30*  CALCIUM 7.4* 7.3* 7.5*  7.5*  PHOS 7.9* 7.5* 7.6*  7.7*    Recent Labs  Lab 06/02/22 0311 06/02/22 0337 06/04/22 0449 06/04/22 0750 06/04/22 1927 06/05/22 0651  WBC 5.0  --  3.7*  --   --  4.3  NEUTROABS 3.2  --   --   --   --   --   HGB 8.7*   < > 6.5* 7.0* 7.7* 7.0*  HCT 26.5*   < > 19.8* 21.5* 23.0* 21.6*  MCV 92.7  --  91.7  --   --  90.4  PLT 175  --  132*  --   --  125*   < > = values in this interval not displayed.

## 2022-06-05 NOTE — Progress Notes (Signed)
  Progress Note   Patient: Kara Mullins DHW:[id number] DOB: Mar 02, 1951 DOA: 06/02/2022     3 DOS: the patient was seen and examined on 06/05/2022   Brief hospital course: 71 year old with history of HTN, HLD, CHF with preserved EF, paroxysmal A-fib, CKD stage IV admitted for worsening dyspnea on exertion over the past 2 days.  Follows with outpatient cardiology and nephrology.  Upon admission she was noted to have hypertensive urgency and signs of CHF exacerbation complicated by AKI on CKD stage IV.  Started on high-dose IV diuretics Lasix.  Assessment and Plan: Acute respiratory distress Acute congestive heart failure with preserved ejection fraction, 60%.  Class IV Anasarca/left sided effusion. -Echo in January 2023 showed EF 55% with grade 2 DD.  - Continue high dose diuretic.  Low threshold to start HD if necessary - Echocardiogram- ef 55%, g1DD, large left effusion.  - Cont fluid restriction.  Monitor input and output   Anemia, likely from chronic disease - No obvious signs of blood loss.  Pt s/p 1 unit PRBC    Elevated troponin Flat, chest pain-free.  Demand ischemia.   Acute kidney injury superimposed on chronic kidney disease stage IV -Baseline creatinine 3, admission creatinine 4.  Recently Foley catheter change.  Getting high-dose diuretics with albumin.  Phosphorus is up, will defer to nephro to manage this.  Unfortunately it appears patient is heading towards dialysis - discontinued losartan   Essential hypertension, uncontrolled - On hydralazine 50 mg 3 times daily we will continue Coreg, Lasix, IV as needed hydralazine and Lopressor   Paroxysmal atrial fibrillation -Normal sinus rhythm, not on anticoagulation   Hypocalcemia Repleted.   Subclinical hypothyroidism TSH slightly elevated but T4 normal.   Transaminitis Suspect from hepatic congestion.  Will monitor   Hypoalbuminemia Chronic malnutrition.  Getting IV albumin   Debility Due to worsening swelling  and pulmonary edema       Subjective: Reports feeling better. Repeats that "I'm a fighter" and that "it'll get better."  Physical Exam: Vitals:   06/04/22 2004 06/05/22 0432 06/05/22 0943 06/05/22 1206  BP: (!) 162/79 (!) 177/81 (!) 146/67 (!) 176/74  Pulse: 66 62  63  Resp: 14 (!) 8 17 18   Temp:  (!) 97.3 F (36.3 C) (!) 97.5 F (36.4 C) 98.2 F (36.8 C)  TempSrc:  Oral Oral Oral  SpO2: 100% 97% 99% 100%  Weight:  100.4 kg    Height:       General exam: Awake, laying in bed, in nad Respiratory system: Normal respiratory effort, no wheezing Cardiovascular system: regular rate, s1, s2 Gastrointestinal system: Soft, nondistended, positive BS Central nervous system: CN2-12 grossly intact, strength intact Extremities: Perfused, no clubbing, BLE edema Skin: Normal skin turgor, no notable skin lesions seen Psychiatry: Mood normal // no visual hallucinations   Data Reviewed:  Labs reviewed: Na 141, K 3.6, Cr 4.3  Family Communication: Pt in room, family at bedside  Disposition: Status is: Inpatient Remains inpatient appropriate because: Severity of illness  Planned Discharge Destination: Home with Home Health    Author: Marylu Lund, MD 06/05/2022 2:38 PM  For on call review [email or web address].

## 2022-06-06 DIAGNOSIS — N189 Chronic kidney disease, unspecified: Secondary | ICD-10-CM | POA: Diagnosis not present

## 2022-06-06 DIAGNOSIS — R0603 Acute respiratory distress: Secondary | ICD-10-CM | POA: Diagnosis not present

## 2022-06-06 DIAGNOSIS — N179 Acute kidney failure, unspecified: Secondary | ICD-10-CM | POA: Diagnosis not present

## 2022-06-06 LAB — CBC
HCT: 23 % — ABNORMAL LOW (ref 36.0–46.0)
Hemoglobin: 7.5 g/dL — ABNORMAL LOW (ref 12.0–15.0)
MCH: 29.6 pg (ref 26.0–34.0)
MCHC: 32.6 g/dL (ref 30.0–36.0)
MCV: 90.9 fL (ref 80.0–100.0)
Platelets: 126 10*3/uL — ABNORMAL LOW (ref 150–400)
RBC: 2.53 MIL/uL — ABNORMAL LOW (ref 3.87–5.11)
RDW: 15.2 % (ref 11.5–15.5)
WBC: 4 10*3/uL (ref 4.0–10.5)
nRBC: 0 % (ref 0.0–0.2)

## 2022-06-06 LAB — RENAL FUNCTION PANEL
Albumin: 2.4 g/dL — ABNORMAL LOW (ref 3.5–5.0)
Anion gap: 9 (ref 5–15)
BUN: 85 mg/dL — ABNORMAL HIGH (ref 8–23)
CO2: 22 mmol/L (ref 22–32)
Calcium: 7.8 mg/dL — ABNORMAL LOW (ref 8.9–10.3)
Chloride: 110 mmol/L (ref 98–111)
Creatinine, Ser: 4.19 mg/dL — ABNORMAL HIGH (ref 0.44–1.00)
GFR, Estimated: 11 mL/min — ABNORMAL LOW (ref >60)
Glucose, Bld: 88 mg/dL (ref 70–99)
Phosphorus: 7.7 mg/dL — ABNORMAL HIGH (ref 2.5–4.6)
Potassium: 3.8 mmol/L (ref 3.5–5.1)
Sodium: 141 mmol/L (ref 135–145)

## 2022-06-06 LAB — MAGNESIUM: Magnesium: 2.4 mg/dL (ref 1.7–2.4)

## 2022-06-06 MED ORDER — POTASSIUM CHLORIDE 20 MEQ PO PACK
40.0000 meq | PACK | Freq: Every day | ORAL | Status: AC
  Administered 2022-06-06 – 2022-06-07 (×2): 40 meq via ORAL
  Filled 2022-06-06 (×2): qty 2

## 2022-06-06 MED ORDER — POTASSIUM CHLORIDE CRYS ER 20 MEQ PO TBCR
40.0000 meq | EXTENDED_RELEASE_TABLET | Freq: Every day | ORAL | Status: DC
Start: 2022-06-06 — End: 2022-06-06
  Filled 2022-06-06: qty 2

## 2022-06-06 NOTE — Progress Notes (Signed)
  Progress Note   Patient: Kara Mullins QQP:[id number] DOB: December 08, 1950 DOA: 06/02/2022     4 DOS: the patient was seen and examined on 06/06/2022   Brief hospital course: 71 year old with history of HTN, HLD, CHF with preserved EF, paroxysmal A-fib, CKD stage IV admitted for worsening dyspnea on exertion over the past 2 days.  Follows with outpatient cardiology and nephrology.  Upon admission she was noted to have hypertensive urgency and signs of CHF exacerbation complicated by AKI on CKD stage IV.  Started on high-dose IV diuretics Lasix.  Assessment and Plan: Acute respiratory distress Acute congestive heart failure with preserved ejection fraction, 60%.  Class IV Anasarca/left sided effusion. -Echo in January 2023 showed EF 55% with grade 2 DD.  - Continue high dose diuretic.  Low threshold to start HD if pt fails diuretic - Echocardiogram- ef 55%, g1DD, large left effusion.  - Cont fluid restriction.  Monitor input and output   Anemia, likely from chronic disease - No obvious signs of blood loss.  Pt s/p 1 unit PRBC    Elevated troponin Flat, chest pain-free.  Demand ischemia.   Acute kidney injury superimposed on chronic kidney disease stage IV -Baseline creatinine 3, admission creatinine 4.  Recently Foley catheter change.  Getting high-dose diuretics with albumin.  Phosphorus is up, will defer to nephro to manage this.  Unfortunately it appears patient is heading towards dialysis - discontinued losartan   Essential hypertension, uncontrolled - On hydralazine 50 mg 3 times daily we will continue Coreg, Lasix, IV as needed hydralazine and Lopressor   Paroxysmal atrial fibrillation -Normal sinus rhythm, not on anticoagulation   Hypocalcemia Repleted.   Subclinical hypothyroidism TSH slightly elevated but T4 normal.   Transaminitis Suspect from hepatic congestion.  Will monitor   Hypoalbuminemia Chronic malnutrition.  Getting IV albumin   Debility Due to worsening  swelling and pulmonary edema       Subjective: Reports feeling better today. In good spirits. Continues to say "I'm a fighter"  Physical Exam: Vitals:   06/05/22 1206 06/05/22 1627 06/05/22 2012 06/06/22 0638  BP: (!) 176/74 (!) 170/77 (!) 172/91 (!) 157/65  Pulse: 63  66   Resp: 18 16 16    Temp: 98.2 F (36.8 C)  98.4 F (36.9 C)   TempSrc: Oral  Oral   SpO2: 100%  96%   Weight:      Height:       General exam: Conversant, in no acute distress Respiratory system: normal chest rise, clear, no audible wheezing Cardiovascular system: regular rhythm, s1-s2 Gastrointestinal system: Nondistended, nontender, pos BS Central nervous system: No seizures, no tremors Extremities: No cyanosis, no joint deformities Skin: No rashes, no pallor Psychiatry: Affect normal // no auditory hallucinations   Data Reviewed:  Labs reviewed: Na 141, K 3.8, Cr 4.19  Family Communication: Pt in room, family not at bedside  Disposition: Status is: Inpatient Remains inpatient appropriate because: Severity of illness  Planned Discharge Destination: Home with Home Health    Author: Marylu Lund, MD 06/06/2022 4:22 PM  For on call review [email or web address].

## 2022-06-06 NOTE — Progress Notes (Signed)
Physical Therapy Treatment Patient Details Name: Kara Mullins MRN: [id number] DOB: 1951/03/24 Today's Date: 06/06/2022   History of Present Illness Pt is 71 yo female who presented to [facility] on 06/02/22 with progressive SOB. Pt with hypertensive urgency and CHF exacerbation complicated by AKI on CKD. Pt possibly heading toward HD. PMH: HTN, HLD, HF, Afib, CKD IV    PT Comments    Continue to work on standing to improve mobility and decr burden of care at home. Pt likes using the Stedy to stand. Daughter had looked into a Stedy for home per pt and the price is several thousand dollars so that is not likely an option for home. May need to consider a hoyer lift for home at some point. If pt has to start HD expect her mobility may continue to decline and increase burden of care even higher.   Recommendations for follow up therapy are one component of a multi-disciplinary discharge planning process, led by the attending physician.  Recommendations may be updated based on patient status, additional functional criteria and insurance authorization.  Follow Up Recommendations  Home health PT     Assistance Recommended at Discharge Frequent or constant Supervision/Assistance  Patient can return home with the following Two people to help with walking and/or transfers;A lot of help with bathing/dressing/bathroom;Assist for transportation;Help with stairs or ramp for entrance;Assistance with cooking/housework   Equipment Recommendations  Other (comment) (family would like Stedy for home but likely unable to afford. May need hoyer lift.)    Recommendations for Other Services       Precautions / Restrictions Precautions Precautions: Fall Restrictions Weight Bearing Restrictions: No     Mobility  Bed Mobility Overal bed mobility: Needs Assistance Bed Mobility: Supine to Sit     Supine to sit: Mod assist     General bed mobility comments: Assist to bring legs off of bed, elevate trunk into  sitting and bring hips to EOb.    Transfers Overall transfer level: Needs assistance Equipment used: Ambulation equipment used Transfers: Sit to/from Stand Sit to Stand: Mod assist, Min assist, +2 physical assistance           General transfer comment: Pt required +2 mod assist to bring hips/trunk up when rising from bed using Stedy. Able to rise from elevated seat of Stedy with min assist. Performed sit to stand from there x 10.    Ambulation/Gait             Pre-gait activities: Stood x 10 with Stedy with min assist to maintain and verbal/tactile cues to extend hips, bring back shoulders and shift weight anteriorly. Stood between 15- 45 sec each time General Gait Details: Unable to move feet in standing   Stairs             Wheelchair Mobility    Modified Rankin (Stroke Patients Only)       Balance Overall balance assessment: Needs assistance Sitting-balance support: No upper extremity supported, Feet supported Sitting balance-Leahy Scale: Good     Standing balance support: Bilateral upper extremity supported, Reliant on assistive device for balance Standing balance-Leahy Scale: Poor Standing balance comment: Stedy and min assist for brief static standing                            Cognition Arousal/Alertness: Awake/alert Behavior During Therapy: WFL for tasks assessed/performed Overall Cognitive Status: Within Functional Limits for tasks assessed  Exercises      General Comments        Pertinent Vitals/Pain Pain Assessment Pain Assessment: No/denies pain    Home Living                          Prior Function            PT Goals (current goals can now be found in the care plan section) Acute Rehab PT Goals Patient Stated Goal: return home, be able to walk Progress towards PT goals: Progressing toward goals    Frequency    Min 3X/week      PT Plan  Current plan remains appropriate    Co-evaluation              AM-PAC PT "6 Clicks" Mobility   Outcome Measure  Help needed turning from your back to your side while in a flat bed without using bedrails?: A Lot Help needed moving from lying on your back to sitting on the side of a flat bed without using bedrails?: A Lot Help needed moving to and from a bed to a chair (including a wheelchair)?: Total Help needed standing up from a chair using your arms (e.g., wheelchair or bedside chair)?: Total Help needed to walk in hospital room?: Total Help needed climbing 3-5 steps with a railing? : Total 6 Click Score: 8    End of Session   Activity Tolerance: Patient tolerated treatment well Patient left: in bed;with call bell/phone within reach;with bed alarm set (sitting EOB) Nurse Communication: Mobility status PT Visit Diagnosis: Muscle weakness (generalized) (M62.81);Difficulty in walking, not elsewhere classified (R26.2)     Time: 3612-2449 PT Time Calculation (min) (ACUTE ONLY): 17 min  Charges:  $Therapeutic Activity: 8-22 mins                     [place] [facility] [place] 06/06/2022, 12:40 PM

## 2022-06-06 NOTE — Progress Notes (Signed)
Admit: 06/02/2022 LOS: 4  74F progressive CKD, nephrotic syndrome of unclear etiology, recurrent volume overload/HFpEF  Subjective:  SCr stable, K 3.8 Better UOP with addition of metolazone Weights are unhelpful She is in great spirits, very positive BPs elevated, on RA Remains edematous  08/23 0701 - 08/24 0700 In: 720 [P.O.:720] Out: 3401 [Urine:3400; Stool:1]  Filed Weights   06/02/22 0303 06/03/22 0433 06/05/22 0432  Weight: 81.6 kg 95 kg 100.4 kg    Scheduled Meds:  calcium acetate  1,334 mg Oral TID WC   carvedilol  6.25 mg Oral BID WC   heparin  5,000 Units Subcutaneous Q8H   hydrALAZINE  50 mg Oral Q8H   metolazone  5 mg Oral Daily   sodium chloride flush  3 mL Intravenous Q12H   Continuous Infusions:  furosemide 160 mg (06/06/22 1013)   PRN Meds:.acetaminophen **OR** acetaminophen, guaiFENesin, hydrALAZINE, ipratropium-albuterol, metoprolol tartrate, Muscle Rub, senna-docusate, traZODone  Current Labs: reviewed    Physical Exam:  Blood pressure (!) 157/65, pulse 66, temperature 98.4 F (36.9 C), temperature source Oral, resp. rate 16, height 5\' 5"  (1.651 m), weight 100.4 kg, SpO2 96 %. GEN: Elderly, chronically ill-appearing sitting in bed, good spirit/affect ENT: Poor dentition EYES: EOMI CV: Regular, normal S1 and S2, no rub PULM: Diminished in the bases, otherwise clear ABD: Soft, nontender SKIN: No rashes or lesions, legs are wrapped EXT: 3+ lower extremity edema  A AoCKD4 with nephrotic syndrome Etiology unclear, serologies were negative, work-up for amyloid nondiagnostic Cont Lasix 160 IV 3 times daily, metolazone 5mg /d x3d at least started 8/23; give 40 mEq PO KCl today and otmorrow Albumin 2.4, improved If fails diuresis would begin HD Nephrotic syndrome: As above.  Never biopsied HTN / Acute on chronic HFpEF, as above; will improved as we achieve euvolemia Chronic Foley catheter, ? Why Hyperphosphatemia started CaAcetate 8/23 Anemia: Hb 7s,  transfuse as needed  P Diurese today, no changes, as above Folow closely Daily weights, Daily Renal Panel, Strict I/Os, Avoid nephrotoxins (NSAIDs, judicious IV Contrast)  Medication Issues; Preferred narcotic agents for pain control are hydromorphone, fentanyl, and methadone. Morphine should not be used.  Baclofen should be avoided Avoid oral sodium phosphate and magnesium citrate based laxatives / bowel preps    Pearson Grippe MD 06/06/2022, 11:11 AM  Recent Labs  Lab 06/04/22 0449 06/05/22 0651 06/06/22 0440  NA 143 141  141 141  K 3.4* 3.6  3.6 3.8  CL 110 111  110 110  CO2 21* 23  22 22   GLUCOSE 90 63*  64* 88  BUN 79* 81*  83* 85*  CREATININE 4.11* 4.21*  4.30* 4.19*  CALCIUM 7.3* 7.5*  7.5* 7.8*  PHOS 7.5* 7.6*  7.7* 7.7*    Recent Labs  Lab 06/02/22 0311 06/02/22 0337 06/04/22 0449 06/04/22 0750 06/04/22 1927 06/05/22 0651 06/06/22 0440  WBC 5.0  --  3.7*  --   --  4.3 4.0  NEUTROABS 3.2  --   --   --   --   --   --   HGB 8.7*   < > 6.5*   < > 7.7* 7.0* 7.5*  HCT 26.5*   < > 19.8*   < > 23.0* 21.6* 23.0*  MCV 92.7  --  91.7  --   --  90.4 90.9  PLT 175  --  132*  --   --  125* 126*   < > = values in this interval not displayed.

## 2022-06-06 NOTE — Care Management Important Message (Signed)
Important Message  Patient Details  Name: Kara Mullins MRN: [id number] Date of Birth: 26-Aug-1951   Medicare Important Message Given:  Yes     Shelda Altes 06/06/2022, 7:48 AM

## 2022-06-07 DIAGNOSIS — I509 Heart failure, unspecified: Secondary | ICD-10-CM | POA: Diagnosis not present

## 2022-06-07 DIAGNOSIS — N179 Acute kidney failure, unspecified: Secondary | ICD-10-CM | POA: Diagnosis not present

## 2022-06-07 DIAGNOSIS — R0603 Acute respiratory distress: Secondary | ICD-10-CM | POA: Diagnosis not present

## 2022-06-07 LAB — CBC
HCT: 22.9 % — ABNORMAL LOW (ref 36.0–46.0)
Hemoglobin: 7.5 g/dL — ABNORMAL LOW (ref 12.0–15.0)
MCH: 29.8 pg (ref 26.0–34.0)
MCHC: 32.8 g/dL (ref 30.0–36.0)
MCV: 90.9 fL (ref 80.0–100.0)
Platelets: 118 10*3/uL — ABNORMAL LOW (ref 150–400)
RBC: 2.52 MIL/uL — ABNORMAL LOW (ref 3.87–5.11)
RDW: 15.1 % (ref 11.5–15.5)
WBC: 3.5 10*3/uL — ABNORMAL LOW (ref 4.0–10.5)
nRBC: 0 % (ref 0.0–0.2)

## 2022-06-07 LAB — RENAL FUNCTION PANEL
Albumin: 2.3 g/dL — ABNORMAL LOW (ref 3.5–5.0)
Anion gap: 10 (ref 5–15)
BUN: 92 mg/dL — ABNORMAL HIGH (ref 8–23)
CO2: 20 mmol/L — ABNORMAL LOW (ref 22–32)
Calcium: 8 mg/dL — ABNORMAL LOW (ref 8.9–10.3)
Chloride: 111 mmol/L (ref 98–111)
Creatinine, Ser: 4.4 mg/dL — ABNORMAL HIGH (ref 0.44–1.00)
GFR, Estimated: 10 mL/min — ABNORMAL LOW (ref >60)
Glucose, Bld: 71 mg/dL (ref 70–99)
Phosphorus: 7.7 mg/dL — ABNORMAL HIGH (ref 2.5–4.6)
Potassium: 3.8 mmol/L (ref 3.5–5.1)
Sodium: 141 mmol/L (ref 135–145)

## 2022-06-07 LAB — MAGNESIUM: Magnesium: 2.4 mg/dL (ref 1.7–2.4)

## 2022-06-07 MED ORDER — ONDANSETRON HCL 4 MG/2ML IJ SOLN
4.0000 mg | Freq: Three times a day (TID) | INTRAMUSCULAR | Status: DC | PRN
  Administered 2022-06-13 – 2022-06-14 (×2): 4 mg via INTRAVENOUS
  Filled 2022-06-07 (×3): qty 2

## 2022-06-07 NOTE — Progress Notes (Signed)
  Progress Note   Patient: Kara Mullins BWI:[id number] DOB: 1951/01/17 DOA: 06/02/2022     5 DOS: the patient was seen and examined on 06/07/2022   Brief hospital course: 71 year old with history of HTN, HLD, CHF with preserved EF, paroxysmal A-fib, CKD stage IV admitted for worsening dyspnea on exertion over the past 2 days.  Follows with outpatient cardiology and nephrology.  Upon admission she was noted to have hypertensive urgency and signs of CHF exacerbation complicated by AKI on CKD stage IV.  Started on high-dose IV diuretics Lasix.  Assessment and Plan: Acute respiratory distress Acute congestive heart failure with preserved ejection fraction, 60%.  Class IV Anasarca/left sided effusion. -Echo in January 2023 showed EF 55% with grade 2 DD.  - Continue high dose diuretic.  Low threshold to start HD if pt fails diuretic - Echocardiogram- ef 55%, g1DD, large left effusion.  - Cont fluid restriction.  Monitor input and output -Holding PhosLo for now   Anemia, likely from chronic disease - No obvious signs of blood loss.  Pt s/p 1 unit PRBC this visit    Elevated troponin Flat, chest pain-free.  Demand ischemia.   Acute kidney injury superimposed on chronic kidney disease stage IV -Baseline creatinine 3, admission creatinine 4.  Recently Foley catheter change.  Getting high-dose diuretics with albumin.  Phosphorus is up, will defer to nephro to manage this.  Unfortunately it appears patient is heading towards dialysis - discontinued losartan   Essential hypertension, uncontrolled - On hydralazine 50 mg 3 times daily we will continue Coreg, Lasix, IV as needed hydralazine and Lopressor   Paroxysmal atrial fibrillation -Normal sinus rhythm, not on anticoagulation   Hypocalcemia Repleted.   Subclinical hypothyroidism TSH slightly elevated but T4 normal.   Transaminitis Suspect from hepatic congestion.  Will monitor   Hypoalbuminemia Chronic malnutrition.  Getting IV  albumin   Debility Due to worsening swelling and pulmonary edema -Therapy recs for HHPT/OT at time of d/c       Subjective: Nauseated this AM but feeling better later in the morning  Physical Exam: Vitals:   06/07/22 0549 06/07/22 0550 06/07/22 0728 06/07/22 1626  BP:  136/67 (!) 148/69 131/63  Pulse:   61 64  Resp:  14 18 15   Temp:  98.2 F (36.8 C) (!) 97.4 F (36.3 C) (!) 97.5 F (36.4 C)  TempSrc:  Oral Oral Oral  SpO2: 100%   95%  Weight:  99.3 kg    Height:       General exam: Awake, laying in bed, in nad Respiratory system: Normal respiratory effort, no wheezing Cardiovascular system: regular rate, s1, s2 Gastrointestinal system: Soft, nondistended, positive BS Central nervous system: CN2-12 grossly intact, strength intact Extremities: Perfused, no clubbing Skin: Normal skin turgor, no notable skin lesions seen Psychiatry: Mood normal // no visual hallucinations   Data Reviewed:  Labs reviewed: Na 141, K 3.8, Cr 4.40  Family Communication: Pt in room, family not at bedside  Disposition: Status is: Inpatient Remains inpatient appropriate because: Severity of illness  Planned Discharge Destination: Home with Home Health    Author: Marylu Lund, MD 06/07/2022 4:35 PM  For on call review [email or web address].

## 2022-06-07 NOTE — Progress Notes (Signed)
Admit: 06/02/2022 LOS: 5  61F progressive CKD, nephrotic syndrome of unclear etiology, recurrent volume overload/HFpEF  Subjective:  SCr 4.4, K 3.8 Only 1.3L UOP yesterday? Weights are confusing She is in great spirits, very positive Having N/V with PhosLo BPs elevated, on RA Remains edematous into thigs and presacral region  08/24 0701 - 08/25 0700 In: 820 [P.O.:720] Out: 1250 [Urine:1250]  Filed Weights   06/03/22 0433 06/05/22 0432 06/07/22 0550  Weight: 95 kg 100.4 kg 99.3 kg    Scheduled Meds:  carvedilol  6.25 mg Oral BID WC   heparin  5,000 Units Subcutaneous Q8H   hydrALAZINE  50 mg Oral Q8H   sodium chloride flush  3 mL Intravenous Q12H   Continuous Infusions:  furosemide 160 mg (06/07/22 1039)   PRN Meds:.acetaminophen **OR** acetaminophen, guaiFENesin, hydrALAZINE, ipratropium-albuterol, metoprolol tartrate, Muscle Rub, ondansetron (ZOFRAN) IV, senna-docusate, traZODone  Current Labs: reviewed    Physical Exam:  Blood pressure (!) 148/69, pulse 61, temperature (!) 97.4 F (36.3 C), temperature source Oral, resp. rate 18, height 5\' 5"  (1.651 m), weight 99.3 kg, SpO2 100 %. GEN: Elderly, chronically ill-appearing sitting in bed, good spirit/affect ENT: Poor dentition EYES: EOMI CV: Regular, normal S1 and S2, no rub PULM: Diminished in the bases, otherwise clear ABD: Soft, nontender SKIN: No rashes or lesions, legs are wrapped EXT: 3+ lower extremity edema into thigs and sacral region  A AoCKD4 with nephrotic syndrome Etiology unclear, serologies were negative, work-up for amyloid nondiagnostic Cont Lasix 160 IV 3 times daily, metolazone 5mg /d x3d at least started 8/23 Probably try to transition to PO diuretics over the weekend Albumin 2.3, If fails diuresis would begin HD Nephrotic syndrome: As above.  Never biopsied HTN / Acute on chronic HFpEF, as above; will improved as we achieve euvolemia Chronic Foley catheter, ? Why Hyperphosphatemia started  CaAcetate 8/23 but tolerate poorly 2/2 N/V, stop Anemia: Hb 7s, transfuse as needed  P Diurese today, no changes, as above Stop PhosLo, no additional binder for right now Folow closely Daily weights, Daily Renal Panel, Strict I/Os, Avoid nephrotoxins (NSAIDs, judicious IV Contrast)  Medication Issues; Preferred narcotic agents for pain control are hydromorphone, fentanyl, and methadone. Morphine should not be used.  Baclofen should be avoided Avoid oral sodium phosphate and magnesium citrate based laxatives / bowel preps    Pearson Grippe MD 06/07/2022, 10:54 AM  Recent Labs  Lab 06/05/22 0651 06/06/22 0440 06/07/22 0540  NA 141  141 141 141  K 3.6  3.6 3.8 3.8  CL 111  110 110 111  CO2 23  22 22  20*  GLUCOSE 63*  64* 88 71  BUN 81*  83* 85* 92*  CREATININE 4.21*  4.30* 4.19* 4.40*  CALCIUM 7.5*  7.5* 7.8* 8.0*  PHOS 7.6*  7.7* 7.7* 7.7*    Recent Labs  Lab 06/02/22 0311 06/02/22 0337 06/05/22 0651 06/06/22 0440 06/07/22 0540  WBC 5.0   < > 4.3 4.0 3.5*  NEUTROABS 3.2  --   --   --   --   HGB 8.7*   < > 7.0* 7.5* 7.5*  HCT 26.5*   < > 21.6* 23.0* 22.9*  MCV 92.7   < > 90.4 90.9 90.9  PLT 175   < > 125* 126* 118*   < > = values in this interval not displayed.

## 2022-06-07 NOTE — Progress Notes (Signed)
Occupational Therapy Treatment Patient Details Name: Kara Mullins MRN: [id number] DOB: 04/06/1951 Today's Date: 06/07/2022   History of present illness Pt is 71 yo female who presented to [facility] on 06/02/22 with progressive SOB. Pt with hypertensive urgency and CHF exacerbation complicated by AKI on CKD. Pt possibly heading toward HD. PMH: HTN, HLD, HF, Afib, CKD IV   OT comments  Patient received in supine and agreeable to OT treatment but requested not to transfer to recliner. Patient was mod assist to get to EOB and performed grooming with supervision and gown change. Patient was mod/max assist to stand from EOB into stedy and performed 3 stands from stedy with mod assist and min assist for balance. After 3rd stand patient stated she felt nauseous and asked for emesis bag. Patient asked to remain on EOB after session. Nursing notified. Acute OT to continue to follow.    Recommendations for follow up therapy are one component of a multi-disciplinary discharge planning process, led by the attending physician.  Recommendations may be updated based on patient status, additional functional criteria and insurance authorization.    Follow Up Recommendations  Home health OT    Assistance Recommended at Discharge Frequent or constant Supervision/Assistance  Patient can return home with the following  Two people to help with walking and/or transfers;A lot of help with bathing/dressing/bathroom;Assistance with cooking/housework;Direct supervision/assist for financial management;Direct supervision/assist for medications management;Assist for transportation;Help with stairs or ramp for entrance   Equipment Recommendations  None recommended by OT;Other (comment) (patient has all needed equipment)    Recommendations for Other Services      Precautions / Restrictions Precautions Precautions: Fall Restrictions Weight Bearing Restrictions: No       Mobility Bed Mobility Overal bed mobility: Needs  Assistance Bed Mobility: Supine to Sit     Supine to sit: Mod assist     General bed mobility comments: required assistance with BLEs to get off of bed and elevate trunk    Transfers Overall transfer level: Needs assistance Equipment used: Ambulation equipment used Transfers: Sit to/from Stand Sit to Stand: Mod assist, From elevated surface           General transfer comment: Mod/max assist to stand from EOB and mod assist to stand from Stedy pads for 3 stands     Balance Overall balance assessment: Needs assistance Sitting-balance support: No upper extremity supported, Feet supported Sitting balance-Leahy Scale: Good Sitting balance - Comments: remained on EOB at end of session   Standing balance support: Bilateral upper extremity supported, Reliant on assistive device for balance Standing balance-Leahy Scale: Poor Standing balance comment: min assist to maintain balance while standing in stedy for 30 seconds                           ADL either performed or assessed with clinical judgement   ADL Overall ADL's : Needs assistance/impaired     Grooming: Wash/dry hands;Wash/dry face;Supervision/safety;Sitting Grooming Details (indicate cue type and reason): on EOB         Upper Body Dressing : Minimal assistance Upper Body Dressing Details (indicate cue type and reason): changed gowns seated on EOB                   General ADL Comments: performed light grooming and gown change, declined all other ADLs    Extremity/Trunk Assessment              Vision  Perception     Praxis      Cognition Arousal/Alertness: Awake/alert Behavior During Therapy: WFL for tasks assessed/performed Overall Cognitive Status: Within Functional Limits for tasks assessed                                          Exercises      Shoulder Instructions       General Comments felt nauseous after 3rd stand in Stedy and needed emesis  bag    Pertinent Vitals/ Pain       Pain Assessment Pain Assessment: Faces Faces Pain Scale: Hurts a little bit Pain Location: LE's Pain Descriptors / Indicators: Tightness, Sore Pain Intervention(s): Monitored during session, Repositioned  Home Living                                          Prior Functioning/Environment              Frequency  Min 2X/week        Progress Toward Goals  OT Goals(current goals can now be found in the care plan section)  Progress towards OT goals: Progressing toward goals  Acute Rehab OT Goals Patient Stated Goal: get better OT Goal Formulation: With patient Time For Goal Achievement: 06/17/22 Potential to Achieve Goals: Good ADL Goals Pt Will Perform Upper Body Bathing: with min assist;bed level Pt Will Perform Lower Body Bathing: with mod assist;bed level Additional ADL Goal #1: pt will perform bed mobility min A in prep for ADLs  Plan Discharge plan remains appropriate;Frequency remains appropriate    Co-evaluation                 AM-PAC OT "6 Clicks" Daily Activity     Outcome Measure   Help from another person eating meals?: None Help from another person taking care of personal grooming?: None Help from another person toileting, which includes using toliet, bedpan, or urinal?: Total Help from another person bathing (including washing, rinsing, drying)?: A Lot Help from another person to put on and taking off regular upper body clothing?: A Little Help from another person to put on and taking off regular lower body clothing?: A Lot 6 Click Score: 16    End of Session Equipment Utilized During Treatment: Other (comment) Charlaine Dalton)  OT Visit Diagnosis: Other abnormalities of gait and mobility (R26.89);Unsteadiness on feet (R26.81);Muscle weakness (generalized) (M62.81)   Activity Tolerance Other (comment) (felt nauseous at end of session)   Patient Left in bed;with call bell/phone within reach;Other  (comment) (seated onEOB)   Nurse Communication Mobility status;Other (comment) (patient left seated on EOB)        Time: 7948-0165 OT Time Calculation (min): 24 min  Charges: OT General Charges $OT Visit: 1 Visit OT Treatments $Self Care/Home Management : 8-22 mins $Therapeutic Activity: 8-22 mins  Lodema Hong, [place]  Office [phone]   Trixie Dredge 06/07/2022, 1:07 PM

## 2022-06-07 NOTE — Progress Notes (Signed)
Heart Failure Nurse Navigator Progress Note  HF navigation team continuing to follow this admission  Cr increasing (4.40 8/25)  Diuresing with 160 mg IV Lasix TID and Metolazone 5 mg daily x3d (last dose 8/25)  Per nephrology possibly change to PO diuretics over weekend, if fails diuresis will need to start HD  With CKD4 and ? HD in future, not sure benefit from [facility], will continue to follow   Kevan Rosebush, RN, BSN, [facility] Heart Failure Navigator Heart & Vascular Care Navigation Team

## 2022-06-08 ENCOUNTER — Other Ambulatory Visit: Payer: Self-pay

## 2022-06-08 DIAGNOSIS — I509 Heart failure, unspecified: Secondary | ICD-10-CM | POA: Diagnosis not present

## 2022-06-08 DIAGNOSIS — R0603 Acute respiratory distress: Secondary | ICD-10-CM | POA: Diagnosis not present

## 2022-06-08 LAB — BASIC METABOLIC PANEL
Anion gap: 11 (ref 5–15)
BUN: 94 mg/dL — ABNORMAL HIGH (ref 8–23)
CO2: 17 mmol/L — ABNORMAL LOW (ref 22–32)
Calcium: 7.9 mg/dL — ABNORMAL LOW (ref 8.9–10.3)
Chloride: 109 mmol/L (ref 98–111)
Creatinine, Ser: 4.44 mg/dL — ABNORMAL HIGH (ref 0.44–1.00)
GFR, Estimated: 10 mL/min — ABNORMAL LOW (ref >60)
Glucose, Bld: 93 mg/dL (ref 70–99)
Potassium: 5.4 mmol/L — ABNORMAL HIGH (ref 3.5–5.1)
Sodium: 137 mmol/L (ref 135–145)

## 2022-06-08 LAB — RENAL FUNCTION PANEL
Albumin: 2.3 g/dL — ABNORMAL LOW (ref 3.5–5.0)
Anion gap: 9 (ref 5–15)
BUN: 92 mg/dL — ABNORMAL HIGH (ref 8–23)
CO2: 21 mmol/L — ABNORMAL LOW (ref 22–32)
Calcium: 8 mg/dL — ABNORMAL LOW (ref 8.9–10.3)
Chloride: 108 mmol/L (ref 98–111)
Creatinine, Ser: 4.42 mg/dL — ABNORMAL HIGH (ref 0.44–1.00)
GFR, Estimated: 10 mL/min — ABNORMAL LOW (ref >60)
Glucose, Bld: 96 mg/dL (ref 70–99)
Phosphorus: 7.7 mg/dL — ABNORMAL HIGH (ref 2.5–4.6)
Potassium: 3.6 mmol/L (ref 3.5–5.1)
Sodium: 138 mmol/L (ref 135–145)

## 2022-06-08 LAB — CBC
HCT: 24.8 % — ABNORMAL LOW (ref 36.0–46.0)
Hemoglobin: 8.1 g/dL — ABNORMAL LOW (ref 12.0–15.0)
MCH: 30 pg (ref 26.0–34.0)
MCHC: 32.7 g/dL (ref 30.0–36.0)
MCV: 91.9 fL (ref 80.0–100.0)
Platelets: 131 10*3/uL — ABNORMAL LOW (ref 150–400)
RBC: 2.7 MIL/uL — ABNORMAL LOW (ref 3.87–5.11)
RDW: 15.3 % (ref 11.5–15.5)
WBC: 4.2 10*3/uL (ref 4.0–10.5)
nRBC: 0.5 % — ABNORMAL HIGH (ref 0.0–0.2)

## 2022-06-08 LAB — MAGNESIUM: Magnesium: 2.7 mg/dL — ABNORMAL HIGH (ref 1.7–2.4)

## 2022-06-08 MED ORDER — METOLAZONE 5 MG PO TABS
10.0000 mg | ORAL_TABLET | Freq: Two times a day (BID) | ORAL | Status: DC
  Administered 2022-06-08 – 2022-06-10 (×5): 10 mg via ORAL
  Filled 2022-06-08 (×5): qty 2

## 2022-06-08 NOTE — Progress Notes (Signed)
  Progress Note   Patient: Kara Mullins GEZ:[id number] DOB: 1951-06-14 DOA: 06/02/2022     6 DOS: the patient was seen and examined on 06/08/2022   Brief hospital course: 71 year old with history of HTN, HLD, CHF with preserved EF, paroxysmal A-fib, CKD stage IV admitted for worsening dyspnea on exertion over the past 2 days.  Follows with outpatient cardiology and nephrology.  Upon admission she was noted to have hypertensive urgency and signs of CHF exacerbation complicated by AKI on CKD stage IV.  Started on high-dose IV diuretics Lasix.  Assessment and Plan: Acute respiratory distress Acute congestive heart failure with preserved ejection fraction, 60%.  Class IV Anasarca/left sided effusion. -Echo in January 2023 showed EF 55% with grade 2 DD.  - Continue high dose diuretic.  Low threshold to start HD if pt fails diuretic - Echocardiogram- ef 55%, g1DD, large left effusion.  - Cont fluid restriction.  Monitor input and output -PhosLo held per Nephrology   Anemia, likely from chronic disease - No obvious signs of blood loss.  Pt s/p 1 unit PRBC this visit    Elevated troponin Flat, chest pain-free.  Demand ischemia.   Acute kidney injury superimposed on chronic kidney disease stage IV -Baseline creatinine 3, admission creatinine 4.  Recently Foley catheter change.  Getting high-dose diuretics with albumin.  Phosphorus is up, will defer to nephro to manage this.  Unfortunately it appears patient is heading towards dialysis - discontinued losartan   Essential hypertension, uncontrolled - On hydralazine 50 mg 3 times daily we will continue Coreg, Lasix, IV as needed hydralazine and Lopressor   Paroxysmal atrial fibrillation -Normal sinus rhythm, not on anticoagulation   Hypocalcemia Repleted.   Subclinical hypothyroidism TSH slightly elevated but T4 normal.   Transaminitis Suspect from hepatic congestion.  Will monitor   Hypoalbuminemia Chronic malnutrition.  Getting IV  albumin   Debility Due to worsening swelling and pulmonary edema -Therapy recs for HHPT/OT at time of d/c       Subjective: Reports feeling better today. No longer nauseated  Physical Exam: Vitals:   06/07/22 1948 06/08/22 0506 06/08/22 0812 06/08/22 1142  BP: 134/66 (!) 146/69 121/63 126/62  Pulse: 62 62 (!) 59 (!) 55  Resp: 18 15 18 18   Temp: (!) 97.5 F (36.4 C) (!) 97.5 F (36.4 C)  97.8 F (36.6 C)  TempSrc: Oral Oral  Oral  SpO2: 99% 97%  98%  Weight:  98.5 kg    Height:       General exam: Conversant, in no acute distress Respiratory system: normal chest rise, clear, no audible wheezing Cardiovascular system: regular rhythm, s1-s2 Gastrointestinal system: Nondistended, nontender, pos BS Central nervous system: No seizures, no tremors Extremities: No cyanosis, no joint deformities, BLE edema Skin: No rashes, no pallor Psychiatry: Affect normal // no auditory hallucinations   Data Reviewed:  Labs reviewed: Na 137, K 3.6, Cr 4.42  Family Communication: Pt in room, family not at bedside  Disposition: Status is: Inpatient Remains inpatient appropriate because: Severity of illness  Planned Discharge Destination: Home with Home Health    Author: Marylu Lund, MD 06/08/2022 4:18 PM  For on call review [email or web address].

## 2022-06-08 NOTE — Progress Notes (Signed)
Admit: 06/02/2022 LOS: 6  50F progressive CKD, nephrotic syndrome of unclear etiology, recurrent volume overload/HFpEF  Subjective:  SCr stable 2.2L UOP yesterday; net neg 6L from admit Weights are confusing; mabye coming down now She is in great spirits, very positive Remains edematous into thigs and presacral region  08/25 0701 - 08/26 0700 In: 680 [P.O.:680] Out: 2100 [Urine:2100]  Filed Weights   06/05/22 0432 06/07/22 0550 06/08/22 0506  Weight: 100.4 kg 99.3 kg 98.5 kg    Scheduled Meds:  carvedilol  6.25 mg Oral BID WC   heparin  5,000 Units Subcutaneous Q8H   hydrALAZINE  50 mg Oral Q8H   metolazone  10 mg Oral BID   sodium chloride flush  3 mL Intravenous Q12H   Continuous Infusions:  furosemide 160 mg (06/08/22 0553)   PRN Meds:.acetaminophen **OR** acetaminophen, guaiFENesin, hydrALAZINE, ipratropium-albuterol, metoprolol tartrate, Muscle Rub, ondansetron (ZOFRAN) IV, senna-docusate, traZODone  Current Labs: reviewed    Physical Exam:  Blood pressure 121/63, pulse (!) 59, temperature (!) 97.5 F (36.4 C), temperature source Oral, resp. rate 18, height 5\' 5"  (1.651 m), weight 98.5 kg, SpO2 97 %. GEN: Elderly, chronically ill-appearing sitting in bed, good spirit/affect ENT: Poor dentition EYES: EOMI CV: Regular, normal S1 and S2, no rub PULM: Diminished in the bases, otherwise clear ABD: Soft, nontender SKIN: No rashes or lesions, legs are wrapped EXT: 3+ lower extremity edema into thigs and sacral region  A AoCKD4 with nephrotic syndrome; Hypervolemia Etiology unclear, serologies were negative, work-up for amyloid nondiagnostic Cont Lasix 160 IV 3 times daily,  Add metolazone back inc to 10mg  BID Not ready for PO diuresis If fails diuresis would begin HD Nephrotic syndrome: As above.  Never biopsied HTN / Acute on chronic HFpEF, as above; will improved as we achieve euvolemia Chronic Foley catheter, ? Why; keep in now Hyperphosphatemia started  CaAcetate 8/23 but tolerate poorly 2/2 N/V, stop trend P Anemia: Hb 8.1, transfuse as needed  P As above, bid meolazone today Folow closely Daily weights, Daily Renal Panel, Strict I/Os, Avoid nephrotoxins (NSAIDs, judicious IV Contrast)  Medication Issues; Preferred narcotic agents for pain control are hydromorphone, fentanyl, and methadone. Morphine should not be used.  Baclofen should be avoided Avoid oral sodium phosphate and magnesium citrate based laxatives / bowel preps    Pearson Grippe MD 06/08/2022, 8:36 AM  Recent Labs  Lab 06/06/22 0440 06/07/22 0540 06/08/22 0639  NA 141 141 137  138  K 3.8 3.8 5.4*  3.6  CL 110 111 109  108  CO2 22 20* 17*  21*  GLUCOSE 88 71 93  96  BUN 85* 92* 94*  92*  CREATININE 4.19* 4.40* 4.44*  4.42*  CALCIUM 7.8* 8.0* 7.9*  8.0*  PHOS 7.7* 7.7* 7.7*    Recent Labs  Lab 06/02/22 0311 06/02/22 0337 06/06/22 0440 06/07/22 0540 06/08/22 0639  WBC 5.0   < > 4.0 3.5* 4.2  NEUTROABS 3.2  --   --   --   --   HGB 8.7*   < > 7.5* 7.5* 8.1*  HCT 26.5*   < > 23.0* 22.9* 24.8*  MCV 92.7   < > 90.9 90.9 91.9  PLT 175   < > 126* 118* 131*   < > = values in this interval not displayed.

## 2022-06-09 DIAGNOSIS — R0603 Acute respiratory distress: Secondary | ICD-10-CM | POA: Diagnosis not present

## 2022-06-09 DIAGNOSIS — N179 Acute kidney failure, unspecified: Secondary | ICD-10-CM | POA: Diagnosis not present

## 2022-06-09 DIAGNOSIS — I509 Heart failure, unspecified: Secondary | ICD-10-CM | POA: Diagnosis not present

## 2022-06-09 LAB — RENAL FUNCTION PANEL
Albumin: 2.1 g/dL — ABNORMAL LOW (ref 3.5–5.0)
Anion gap: 8 (ref 5–15)
BUN: 93 mg/dL — ABNORMAL HIGH (ref 8–23)
CO2: 22 mmol/L (ref 22–32)
Calcium: 7.7 mg/dL — ABNORMAL LOW (ref 8.9–10.3)
Chloride: 107 mmol/L (ref 98–111)
Creatinine, Ser: 4.42 mg/dL — ABNORMAL HIGH (ref 0.44–1.00)
GFR, Estimated: 10 mL/min — ABNORMAL LOW (ref >60)
Glucose, Bld: 81 mg/dL (ref 70–99)
Phosphorus: 8 mg/dL — ABNORMAL HIGH (ref 2.5–4.6)
Potassium: 3.4 mmol/L — ABNORMAL LOW (ref 3.5–5.1)
Sodium: 137 mmol/L (ref 135–145)

## 2022-06-09 LAB — CBC
HCT: 23.9 % — ABNORMAL LOW (ref 36.0–46.0)
Hemoglobin: 7.7 g/dL — ABNORMAL LOW (ref 12.0–15.0)
MCH: 29.5 pg (ref 26.0–34.0)
MCHC: 32.2 g/dL (ref 30.0–36.0)
MCV: 91.6 fL (ref 80.0–100.0)
Platelets: 134 10*3/uL — ABNORMAL LOW (ref 150–400)
RBC: 2.61 MIL/uL — ABNORMAL LOW (ref 3.87–5.11)
RDW: 15.2 % (ref 11.5–15.5)
WBC: 3.6 10*3/uL — ABNORMAL LOW (ref 4.0–10.5)
nRBC: 0 % (ref 0.0–0.2)

## 2022-06-09 LAB — MAGNESIUM: Magnesium: 2.5 mg/dL — ABNORMAL HIGH (ref 1.7–2.4)

## 2022-06-09 MED ORDER — POTASSIUM CHLORIDE CRYS ER 20 MEQ PO TBCR
40.0000 meq | EXTENDED_RELEASE_TABLET | Freq: Once | ORAL | Status: AC
  Administered 2022-06-09: 40 meq via ORAL
  Filled 2022-06-09: qty 2

## 2022-06-09 NOTE — Progress Notes (Signed)
Admit: 06/02/2022 LOS: 7  43F progressive CKD, nephrotic syndrome of unclear etiology, recurrent volume overload/HFpEF  Subjective:  SCr stable Only 1.5L UOP yesterday; net neg 7L from admit despite high dose lasix and BID metolazone Weights are coming down now She is in great spirits, very positive Discussed diuresis is VERY slow and remains very edematous; offered to go ahead and start HD vs stay with the plan of diuretics, she wants to cont diureis  08/26 0701 - 08/27 0700 In: 480 [P.O.:480] Out: 1500 [Urine:1500]  Filed Weights   06/08/22 0506 06/09/22 0052 06/09/22 0500  Weight: 98.5 kg 96.3 kg 96.3 kg    Scheduled Meds:  carvedilol  6.25 mg Oral BID WC   heparin  5,000 Units Subcutaneous Q8H   hydrALAZINE  50 mg Oral Q8H   metolazone  10 mg Oral BID   sodium chloride flush  3 mL Intravenous Q12H   Continuous Infusions:  furosemide 160 mg (06/09/22 0642)   PRN Meds:.acetaminophen **OR** acetaminophen, guaiFENesin, hydrALAZINE, ipratropium-albuterol, metoprolol tartrate, Muscle Rub, ondansetron (ZOFRAN) IV, senna-docusate, traZODone  Current Labs: reviewed    Physical Exam:  Blood pressure (!) 152/69, pulse 61, temperature 97.8 F (36.6 C), temperature source Oral, resp. rate 18, height 5\' 5"  (1.651 m), weight 96.3 kg, SpO2 98 %. GEN: Elderly, chronically ill-appearing sitting in bed, good spirit/affect ENT: Poor dentition EYES: EOMI CV: Regular, normal S1 and S2, no rub PULM: Diminished in the bases, otherwise clear ABD: Soft, nontender SKIN: No rashes or lesions, legs are wrapped EXT: 3+ lower extremity edema into thighs and sacral region  A AoCKD4 with severe nephrotic syndrome; Hypervolemia Etiology unclear, serologies were negative, work-up for amyloid nondiagnostic Cont Lasix 160 IV 3 times daily, metolazone 10mg  BID I don't thnk lasix gtt any more effective Not ready for PO diuresis If fails diuresis would begin HD -- she declined HD start  8/27 Nephrotic syndrome: As above.  Never biopsied HTN / Acute on chronic HFpEF, as above; will improved as we achieve euvolemia Chronic Foley catheter, ? Why; keep in now Hyperphosphatemia started CaAcetate 8/23 but tolerate poorly 2/2 N/V, stop trend P; consider renvela Anemia: Hb 7.7, transfuse as needed  P As above, Folow closely Daily weights, Daily Renal Panel, Strict I/Os, Avoid nephrotoxins (NSAIDs, judicious IV Contrast)  Medication Issues; Preferred narcotic agents for pain control are hydromorphone, fentanyl, and methadone. Morphine should not be used.  Baclofen should be avoided Avoid oral sodium phosphate and magnesium citrate based laxatives / bowel preps    Pearson Grippe MD 06/09/2022, 8:55 AM  Recent Labs  Lab 06/07/22 0540 06/08/22 0639 06/09/22 0352  NA 141 137  138 137  K 3.8 5.4*  3.6 3.4*  CL 111 109  108 107  CO2 20* 17*  21* 22  GLUCOSE 71 93  96 81  BUN 92* 94*  92* 93*  CREATININE 4.40* 4.44*  4.42* 4.42*  CALCIUM 8.0* 7.9*  8.0* 7.7*  PHOS 7.7* 7.7* 8.0*    Recent Labs  Lab 06/07/22 0540 06/08/22 0639 06/09/22 0352  WBC 3.5* 4.2 3.6*  HGB 7.5* 8.1* 7.7*  HCT 22.9* 24.8* 23.9*  MCV 90.9 91.9 91.6  PLT 118* 131* 134*

## 2022-06-09 NOTE — Progress Notes (Signed)
  Progress Note   Patient: Kara Mullins LZJ:[id number] DOB: 01/30/1951 DOA: 06/02/2022     7 DOS: the patient was seen and examined on 06/09/2022   Brief hospital course: 71 year old with history of HTN, HLD, CHF with preserved EF, paroxysmal A-fib, CKD stage IV admitted for worsening dyspnea on exertion over the past 2 days.  Follows with outpatient cardiology and nephrology.  Upon admission she was noted to have hypertensive urgency and signs of CHF exacerbation complicated by AKI on CKD stage IV.  Started on high-dose IV diuretics Lasix.  Assessment and Plan: Acute respiratory distress Acute congestive heart failure with preserved ejection fraction, 60%.  Class IV Anasarca/left sided effusion. -Echo in January 2023 showed EF 55% with grade 2 DD.  - Echocardiogram- ef 55%, g1DD, large left effusion.  - Cont fluid restriction.  Monitor input and output -PhosLo held per Nephrology -Pt noted to be very slow to diurese, Nephrology offered starting HD. Pt wishes to continue diuresis   Anemia, likely from chronic disease - No obvious signs of blood loss.  Pt s/p 1 unit PRBC this visit    Elevated troponin Flat, chest pain-free.  Demand ischemia.   Acute kidney injury superimposed on chronic kidney disease stage IV -Baseline creatinine 3, admission creatinine 4.  Recently Foley catheter change.  Getting high-dose diuretics with albumin.  Phosphorus is up, will defer to nephro to manage this.  Unfortunately it appears patient is heading towards dialysis - discontinued losartan   Essential hypertension, uncontrolled - On hydralazine 50 mg 3 times daily we will continue Coreg, Lasix, IV as needed hydralazine and Lopressor   Paroxysmal atrial fibrillation -Normal sinus rhythm, not on anticoagulation   Hypocalcemia Repleted.   Subclinical hypothyroidism TSH slightly elevated but T4 normal.   Transaminitis Suspect from hepatic congestion.  Will monitor   Hypoalbuminemia Chronic  malnutrition.  Getting IV albumin   Debility Due to worsening swelling and pulmonary edema -Therapy recs for HHPT/OT at time of d/c       Subjective: States feeling better. Without complaints. In good spirits  Physical Exam: Vitals:   06/09/22 0052 06/09/22 0500 06/09/22 0600 06/09/22 1342  BP: (!) 152/67  (!) 152/69 (!) 151/69  Pulse: (!) 57  61   Resp: 14  18 16   Temp: 97.8 F (36.6 C)     TempSrc: Oral     SpO2: 100%  98% 99%  Weight: 96.3 kg 96.3 kg    Height:       General exam: Awake, laying in bed, in nad Respiratory system: Normal respiratory effort, no wheezing Cardiovascular system: regular rate, s1, s2 Gastrointestinal system: Soft, nondistended, positive BS Central nervous system: CN2-12 grossly intact, strength intact Extremities: Perfused, no clubbing Skin: Normal skin turgor, no notable skin lesions seen Psychiatry: Mood normal // no visual hallucinations   Data Reviewed:  Labs reviewed: Na 137, K 3.4, Cr 4.42  Family Communication: Pt in room, family not at bedside  Disposition: Status is: Inpatient Remains inpatient appropriate because: Severity of illness  Planned Discharge Destination: Home with Home Health    Author: Marylu Lund, MD 06/09/2022 4:13 PM  For on call review [email or web address].

## 2022-06-10 ENCOUNTER — Inpatient Hospital Stay (HOSPITAL_COMMUNITY): Payer: Medicare (Managed Care)

## 2022-06-10 DIAGNOSIS — I509 Heart failure, unspecified: Secondary | ICD-10-CM | POA: Diagnosis not present

## 2022-06-10 DIAGNOSIS — R0603 Acute respiratory distress: Secondary | ICD-10-CM | POA: Diagnosis not present

## 2022-06-10 LAB — COMPREHENSIVE METABOLIC PANEL
ALT: 94 U/L — ABNORMAL HIGH (ref 0–44)
AST: 128 U/L — ABNORMAL HIGH (ref 15–41)
Albumin: 2 g/dL — ABNORMAL LOW (ref 3.5–5.0)
Alkaline Phosphatase: 114 U/L (ref 38–126)
Anion gap: 9 (ref 5–15)
BUN: 94 mg/dL — ABNORMAL HIGH (ref 8–23)
CO2: 22 mmol/L (ref 22–32)
Calcium: 7.7 mg/dL — ABNORMAL LOW (ref 8.9–10.3)
Chloride: 108 mmol/L (ref 98–111)
Creatinine, Ser: 4.68 mg/dL — ABNORMAL HIGH (ref 0.44–1.00)
GFR, Estimated: 10 mL/min — ABNORMAL LOW (ref >60)
Glucose, Bld: 77 mg/dL (ref 70–99)
Potassium: 3.6 mmol/L (ref 3.5–5.1)
Sodium: 139 mmol/L (ref 135–145)
Total Bilirubin: 0.4 mg/dL (ref 0.3–1.2)
Total Protein: 5.5 g/dL — ABNORMAL LOW (ref 6.5–8.1)

## 2022-06-10 LAB — CBC
HCT: 23.5 % — ABNORMAL LOW (ref 36.0–46.0)
Hemoglobin: 7.5 g/dL — ABNORMAL LOW (ref 12.0–15.0)
MCH: 29.2 pg (ref 26.0–34.0)
MCHC: 31.9 g/dL (ref 30.0–36.0)
MCV: 91.4 fL (ref 80.0–100.0)
Platelets: 138 10*3/uL — ABNORMAL LOW (ref 150–400)
RBC: 2.57 MIL/uL — ABNORMAL LOW (ref 3.87–5.11)
RDW: 15.2 % (ref 11.5–15.5)
WBC: 3.1 10*3/uL — ABNORMAL LOW (ref 4.0–10.5)
nRBC: 0 % (ref 0.0–0.2)

## 2022-06-10 LAB — LACTIC ACID, PLASMA: Lactic Acid, Venous: 0.7 mmol/L (ref 0.5–1.9)

## 2022-06-10 LAB — MAGNESIUM: Magnesium: 2.7 mg/dL — ABNORMAL HIGH (ref 1.7–2.4)

## 2022-06-10 MED ORDER — SEVELAMER CARBONATE 800 MG PO TABS
1600.0000 mg | ORAL_TABLET | Freq: Three times a day (TID) | ORAL | Status: DC
  Administered 2022-06-10 – 2022-06-11 (×3): 1600 mg via ORAL
  Filled 2022-06-10 (×4): qty 2

## 2022-06-10 MED ORDER — TORSEMIDE 100 MG PO TABS
50.0000 mg | ORAL_TABLET | Freq: Two times a day (BID) | ORAL | Status: DC
Start: 2022-06-10 — End: 2022-06-17
  Administered 2022-06-10 – 2022-06-17 (×14): 50 mg via ORAL
  Filled 2022-06-10 (×17): qty 0.5

## 2022-06-10 NOTE — Progress Notes (Signed)
Patient has a rectal temp of 93.8 , Md made aware awaiting new orders.

## 2022-06-10 NOTE — Progress Notes (Addendum)
HOSPITAL MEDICINE OVERNIGHT EVENT NOTE    Notified by nursing that patient is suddenly exhibiting significant hypothermia with temperatures ranging between 92 and 93 F.  Etiology unclear as patient is not suffering from severe hypothyroidism nor is she currently being treated for severe infection.  Nursing states that patient currently has no complaints and has had no change in mentation.  We will obtain blood cultures, urinalysis, chest x-ray, lactic acid and procalcitonin.  We will attempt passive measures at warming the patient with providing the patient with several blankets and turning of the temperature in the room.  If these passive measures do not seem to measurably increase the patient's temperature we will consider warming with a Bair hugger later this evening.  Marinda Elk  MD [facility]   ADDENDUM (8/29 2am)  Patient continued to exhibit significant hypothermia throughout the evening despite conservative measures of bringing the temperature up.    Infectious work-up revealed a urinalysis that was suggestive of a urinary tract infection as well as extensive infiltrates on chest x-ray with what seems to be a worsening aeration and infiltrates in the right base with air bronchograms as well as a dense consolidation in the left base.  Procalcitonin is slightly elevated.  Considering the possibilities of both pneumonia and urinary tract infection in the setting of persisting hypothermia and leukopenia we will place patient on empiric intravenous antibiotic therapy with ceftriaxone and doxycycline which would cover for both potential sources of infection.  Unfortunately due to the persisting hypothermia we will need to initiate a Bair hugger at this time.  This will be discontinued as soon as we are able.  Continue to monitor patient closely.  Deno Lunger Lilly Gasser

## 2022-06-10 NOTE — Progress Notes (Addendum)
[facility] NEPHROLOGY PROGRESS NOTE  Assessment/ Plan: Pt is a 71 y.o. yo female  progressive CKD, nephrotic syndrome of unclear etiology f/b Dr. Royce Macadamia, recurrent volume overload/HFpEF.  #Acute kidney injury on CKD stage IV with severe nephrotic syndrome, hypervolemia: Reportedly the serologies and work-up of nephrotic syndrome including amyloidosis unremarkable. She is receiving high-dose IV Lasix with decent response, around 1.8 L in last 24 hours.  She is adamant about not wanting dialysis.  She is on room air and breathing looks comfortable therefore I am switching her to oral diuretics, torsemide today.  If lab looks acceptable tomorrow morning then we can probably send her home with oral diuretics.  Fortunately she has no signs or symptoms of uremia and no urgent need to start dialysis.  #Acute CHF with preserved EF/anasarca/left-sided pleural effusion: Plan for diuretics as above.  Continue salt and fluid restriction.  #Hypertension/volume: Continue current cardiac/antihypertensive medications and diuretics as discussed above.  #CKD-MBD, hyperphosphatemia: It seems like the patient did not tolerate calcium acetate therefore we will change to Renvela.  #Anemia of CKD: I will check iron studies.  May benefit from ESA treatment.  Subjective: Seen and examined at bedside.  The patient said her breathing is much better.  Denies nausea, vomiting, chest pain, dysgeusia, shortness of breath.  Urine output of around 1.8 L in 24 hours.  No other new events. Objective Vital signs in last 24 hours: Vitals:   06/09/22 1950 06/10/22 0350 06/10/22 0842 06/10/22 1038  BP: 129/68 122/64 137/61 130/66  Pulse: (!) 58 (!) 53 61 (!) 51  Resp: 14 14  17   Temp: (!) 96.9 F (36.1 C) (!) 97 F (36.1 C)  98.9 F (37.2 C)  TempSrc: Axillary Oral  Oral  SpO2: 99% 99%  99%  Weight:  97.4 kg    Height:       Weight change: 1.1 kg  Intake/Output Summary (Last 24 hours) at 06/10/2022  1318 Last data filed at 06/10/2022 1254 Gross per 24 hour  Intake 1344.92 ml  Output 1725 ml  Net -380.08 ml       Labs: RENAL PANEL Recent Labs    01/01/22 0351 01/02/22 0410 03/27/22 0214 03/27/22 0314 03/28/22 0526 04/06/22 0615 04/07/22 6301 04/08/22 0457 04/22/22 0000 04/24/22 2009 06/02/22 6010 06/02/22 9323 06/03/22 5573 06/04/22 0449 06/05/22 2202 06/06/22 0440 06/07/22 0540 06/08/22 0639 06/09/22 0352 06/10/22 0747  NA 143 141   < >  --    < > 142 143 142   < > 146* 142 144 143 143 141  141 141 141 137  138 137 139  K 4.1 4.1   < >  --    < > 3.7 3.7 4.0   < > 3.8 3.8 3.8 3.6 3.4* 3.6  3.6 3.8 3.8 5.4*  3.6 3.4* 3.6  CL 119* 116*   < >  --    < > 108 109 108   < > 110 111 110 110 110 111  110 110 111 109  108 107 108  CO2 16* 15*   < >  --    < > 27 28 29    < > 29 23  --  24 21* 23  22 22  20* 17*  21* 22 22  GLUCOSE 86 69*   < >  --    < > 90 76 79  --  89 82 76 71 90 63*  64* 88 71 93  96 81 77  BUN 88* 83*   < >  --    < >  59* 62* 64*   < > 61* 78* 84* 79* 79* 81*  83* 85* 92* 94*  92* 93* 94*  CREATININE 5.63* 5.45*   < >  --    < > 3.27* 3.47* 3.50*   < > 3.15* 4.05* 4.10* 4.10* 4.11* 4.21*  4.30* 4.19* 4.40* 4.44*  4.42* 4.42* 4.68*  CALCIUM 6.9* 6.7*   < >  --    < > 7.9* 7.9* 7.6*   < > 7.8* 7.3*  --  7.4* 7.3* 7.5*  7.5* 7.8* 8.0* 7.9*  8.0* 7.7* 7.7*  MG 2.9* 3.1*  --  2.3  --   --   --   --   --   --   --   --   --  2.4 2.4 2.4 2.4 2.7* 2.5* 2.7*  PHOS 10.0* 9.6*  --   --    < > 5.7* 5.5* 5.6*  --   --   --   --  7.9* 7.5* 7.6*  7.7* 7.7* 7.7* 7.7* 8.0*  --   ALBUMIN 1.7* 1.6*   < >  --    < > 1.9* 1.8* 1.7*  --  1.7* 1.6*  --  <1.5*  <1.5* 2.0* 2.4* 2.4* 2.3* 2.3* 2.1* 2.0*   < > = values in this interval not displayed.     Liver Function Tests: Recent Labs  Lab 06/08/22 0639 06/09/22 0352 06/10/22 0747  AST  --   --  128*  ALT  --   --  94*  ALKPHOS  --   --  114  BILITOT  --   --  0.4  PROT  --   --  5.5*  ALBUMIN 2.3*  2.1* 2.0*   No results for input(s): "LIPASE", "AMYLASE" in the last 168 hours. No results for input(s): "AMMONIA" in the last 168 hours. CBC: Recent Labs    12/27/21 0425 12/28/21 0400 12/30/21 0414 12/31/21 0422 12/31/21 2045 01/01/22 0351 01/02/22 0410 03/27/22 0214 04/01/22 1357 04/02/22 0446 05/17/22 0000 06/02/22 0311 06/06/22 0440 06/07/22 0540 06/08/22 0639 06/09/22 0352 06/10/22 0747  HGB 8.0*   < > 7.3* 6.9*   < > 8.6* 8.8*   < >  --    < >  --    < > 7.5* 7.5* 8.1* 7.7* 7.5*  MCV 87.0   < > 90.5 89.6  --  88.8 87.5   < >  --    < >  --    < > 90.9 90.9 91.9 91.6 91.4  VITAMINB12 725  --   --   --   --   --   --   --   --   --   --   --   --   --   --   --   --   FOLATE 18.6  --   --   --   --   --   --   --   --   --   --   --   --   --   --   --   --   FERRITIN 618*  349*   < > 551* 605*  --  584* 575*  --  593*  --   --   --   --   --   --   --   --   TIBC 104*  --   --   --   --   --   --   --  112*  --  120  --   --   --   --   --   --   IRON 53  --   --   --   --   --   --   --  40  --  38  --   --   --   --   --   --   RETICCTPCT 1.5  --   --   --   --   --   --   --   --   --   --   --   --   --   --   --   --    < > = values in this interval not displayed.    Cardiac Enzymes: No results for input(s): "CKTOTAL", "CKMB", "CKMBINDEX", "TROPONINI" in the last 168 hours. CBG: No results for input(s): "GLUCAP" in the last 168 hours.  Iron Studies: No results for input(s): "IRON", "TIBC", "TRANSFERRIN", "FERRITIN" in the last 72 hours. Studies/Results: No results found.  Medications: Infusions:  furosemide 160 mg (06/10/22 0546)    Scheduled Medications:  carvedilol  6.25 mg Oral BID WC   heparin  5,000 Units Subcutaneous Q8H   hydrALAZINE  50 mg Oral Q8H   metolazone  10 mg Oral BID   sodium chloride flush  3 mL Intravenous Q12H    have reviewed scheduled and prn medications.  Physical Exam: General:NAD, comfortable Heart:RRR, s1s2  nl Lungs:clear b/l, no crackle Abdomen:soft, Non-tender, non-distended Extremities:  leg  edema + Neurology: Alert, awake, no asterixis  Kara Mullins 06/10/2022,1:18 PM  LOS: 8 days

## 2022-06-10 NOTE — Progress Notes (Signed)
  Progress Note   Patient: Kara Mullins TTS:[id number] DOB: January 22, 1951 DOA: 06/02/2022     8 DOS: the patient was seen and examined on 06/10/2022   Brief hospital course: 71 year old with history of HTN, HLD, CHF with preserved EF, paroxysmal A-fib, CKD stage IV admitted for worsening dyspnea on exertion over the past 2 days.  Follows with outpatient cardiology and nephrology.  Upon admission she was noted to have hypertensive urgency and signs of CHF exacerbation complicated by AKI on CKD stage IV.  Started on high-dose IV diuretics Lasix.  Assessment and Plan: Acute respiratory distress Acute congestive heart failure with preserved ejection fraction, 60%.  Class IV Anasarca/left sided effusion. -Echo in January 2023 showed EF 55% with grade 2 DD.  - Echocardiogram- ef 55%, g1DD, large left effusion.  - Cont fluid restriction.  Monitor input and output -PhosLo held per Nephrology -Pt noted to be very slow to diurese, Nephrology offered starting HD. Pt wishes to continue diuresis. Continue diuresis per Nephro   Anemia, likely from chronic disease - No obvious signs of blood loss.  Pt s/p 1 unit PRBC this visit    Elevated troponin Flat, chest pain-free.  Demand ischemia.   Acute kidney injury superimposed on chronic kidney disease stage IV -Baseline creatinine 3, admission creatinine 4.  Recently Foley catheter change.  Getting high-dose diuretics with albumin.  Phosphorus is up, will defer to nephro to manage this.  Unfortunately it appears patient is heading towards dialysis - discontinued losartan   Essential hypertension, uncontrolled - On hydralazine 50 mg 3 times daily we will continue Coreg, Lasix, IV as needed hydralazine and Lopressor   Paroxysmal atrial fibrillation -Normal sinus rhythm, not on anticoagulation   Hypocalcemia Repleted.   Subclinical hypothyroidism TSH slightly elevated but T4 normal.   Transaminitis Suspect from hepatic congestion.  Will monitor    Hypoalbuminemia Chronic malnutrition.  Getting IV albumin   Debility Due to worsening swelling and pulmonary edema -Therapy recs for HHPT/OT at time of d/c       Subjective: Reports doing better with mobility today  Physical Exam: Vitals:   06/10/22 0350 06/10/22 0842 06/10/22 1038 06/10/22 1443  BP: 122/64 137/61 130/66 120/63  Pulse: (!) 53 61 (!) 51   Resp: 14  17   Temp: (!) 97 F (36.1 C)  98.9 F (37.2 C)   TempSrc: Oral  Oral   SpO2: 99%  99%   Weight: 97.4 kg     Height:       General exam: Conversant, in no acute distress Respiratory system: normal chest rise, clear, no audible wheezing Cardiovascular system: regular rhythm, s1-s2 Gastrointestinal system: Nondistended, nontender, pos BS Central nervous system: No seizures, no tremors Extremities: No cyanosis, no joint deformities, BLE edema Skin: No rashes, no pallor Psychiatry: Affect normal // no auditory hallucinations   Data Reviewed:  Labs reviewed: Na 139, K 3.6, Cr 4.68  Family Communication: Pt in room, family not at bedside  Disposition: Status is: Inpatient Remains inpatient appropriate because: Severity of illness  Planned Discharge Destination: Home with Home Health    Author: Marylu Lund, MD 06/10/2022 4:18 PM  For on call review [email or web address].

## 2022-06-10 NOTE — Progress Notes (Signed)
Physical Therapy Treatment Patient Details Name: Kara Mullins MRN: [id number] DOB: 13-Oct-1951 Today's Date: 06/10/2022   History of Present Illness Pt is 71 yo female who presented to [facility] on 06/02/22 with progressive SOB. Pt with hypertensive urgency and CHF exacerbation complicated by AKI on CKD. Pt possibly heading toward HD. PMH: HTN, HLD, HF, Afib, CKD IV    PT Comments    Patient progressing well towards PT goals. Session focused on standing in stedy, core activation and there ex in seated/standing position. Pt highly motivated to work with therapy and improve mobility to be able to walk again. Worked on trunk/core exercises, weight shifting and upright tolerance with WB through BLEs. Able to lift LLE off stedy plates today without difficulty, unable to clear RLE off floor. May need to consider working on lateral scoot transfers as family wanting a stedy for home but likely not able to afford it. Will plan to work on lateral scoot transfers next session to prepare for d/c home. Will follow.   Recommendations for follow up therapy are one component of a multi-disciplinary discharge planning process, led by the attending physician.  Recommendations may be updated based on patient status, additional functional criteria and insurance authorization.  Follow Up Recommendations  Home health PT     Assistance Recommended at Discharge Frequent or constant Supervision/Assistance  Patient can return home with the following Two people to help with walking and/or transfers;A lot of help with bathing/dressing/bathroom;Assist for transportation;Help with stairs or ramp for entrance;Assistance with cooking/housework   Equipment Recommendations  Other (comment) (looking into stedys for home, may need hoyer lift)    Recommendations for Other Services       Precautions / Restrictions Precautions Precautions: Fall Restrictions Weight Bearing Restrictions: No     Mobility  Bed Mobility                General bed mobility comments: Sitting EOB upon PT arrival.    Transfers Overall transfer level: Needs assistance Equipment used: Ambulation equipment used Transfers: Sit to/from Stand Sit to Stand: Mod assist, From elevated surface           General transfer comment: Mod A of 2 to stand from EOB x1 and from stedy seat x4 with cues for hip extension and upright.    Ambulation/Gait             Pre-gait activities: Stood in stedy, worked on [employer] shifting- able to clear LLE without difficulty during weight shifting, able to minimally move RLE in a shuffling pattern but not lift up off stedy plates. Able to stand for 3 mins leaning on forearms at times for support.     Stairs             Wheelchair Mobility    Modified Rankin (Stroke Patients Only)       Balance Overall balance assessment: Needs assistance Sitting-balance support: Feet supported, No upper extremity supported Sitting balance-Leahy Scale: Good Sitting balance - Comments: Worked on trunk rotation, side bending x10 bilaterally sitting on stedy seat with and without UE support.   Standing balance support: During functional activity, Reliant on assistive device for balance, Bilateral upper extremity supported Standing balance-Leahy Scale: Poor Standing balance comment: Able to stand in stedy and perform there ex.                            Cognition Arousal/Alertness: Awake/alert Behavior During Therapy: WFL for tasks assessed/performed Overall Cognitive Status:  Within Functional Limits for tasks assessed                                 General Comments: pt very plesant, cooperative and motivated        Exercises General Exercises - Lower Extremity Long Arc Quad: AROM, Right, Left, 10 reps, Seated, AAROM Other Exercises Other Exercises: Scapular retraction x10 in stedy seat Other Exercises: trunk rotation/lateral side bending x10 Other Exercises:  tricep push ups x10 in stedy    General Comments General comments (skin integrity, edema, etc.): Daughter presentduring session.      Pertinent Vitals/Pain Pain Assessment Pain Assessment: Faces Faces Pain Scale: Hurts little more Pain Location: Right knee Pain Descriptors / Indicators: Sore, Discomfort Pain Intervention(s): Monitored during session, Repositioned    Home Living                          Prior Function            PT Goals (current goals can now be found in the care plan section) Progress towards PT goals: Progressing toward goals    Frequency    Min 3X/week      PT Plan Current plan remains appropriate    Co-evaluation              AM-PAC PT "6 Clicks" Mobility   Outcome Measure  Help needed turning from your back to your side while in a flat bed without using bedrails?: A Lot Help needed moving from lying on your back to sitting on the side of a flat bed without using bedrails?: A Lot Help needed moving to and from a bed to a chair (including a wheelchair)?: Total Help needed standing up from a chair using your arms (e.g., wheelchair or bedside chair)?: Total Help needed to walk in hospital room?: Total Help needed climbing 3-5 steps with a railing? : Total 6 Click Score: 8    End of Session Equipment Utilized During Treatment: Gait belt Activity Tolerance: Patient tolerated treatment well Patient left: with call bell/phone within reach;with family/visitor present;in bed (sitting EOB) Nurse Communication: Mobility status PT Visit Diagnosis: Muscle weakness (generalized) (M62.81);Difficulty in walking, not elsewhere classified (R26.2)     Time: 4270-6237 PT Time Calculation (min) (ACUTE ONLY): 28 min  Charges:  $Therapeutic Exercise: 8-22 mins $Therapeutic Activity: 8-22 mins                     Marisa Severin, PT, DPT Acute Rehabilitation Services Secure chat preferred Office [phone]      Marguarite Arbour A  Sabra Heck 06/10/2022, 12:44 PM

## 2022-06-11 ENCOUNTER — Encounter (HOSPITAL_COMMUNITY): Payer: Self-pay | Admitting: Internal Medicine

## 2022-06-11 DIAGNOSIS — R0603 Acute respiratory distress: Secondary | ICD-10-CM | POA: Diagnosis not present

## 2022-06-11 DIAGNOSIS — N189 Chronic kidney disease, unspecified: Secondary | ICD-10-CM | POA: Diagnosis not present

## 2022-06-11 DIAGNOSIS — N179 Acute kidney failure, unspecified: Secondary | ICD-10-CM | POA: Diagnosis not present

## 2022-06-11 LAB — URINALYSIS, ROUTINE W REFLEX MICROSCOPIC
Bilirubin Urine: NEGATIVE
Glucose, UA: NEGATIVE mg/dL
Ketones, ur: NEGATIVE mg/dL
Nitrite: NEGATIVE
Protein, ur: 100 mg/dL — AB
Specific Gravity, Urine: 1.009 (ref 1.005–1.030)
WBC, UA: >50 WBC/hpf — ABNORMAL HIGH (ref 0–5)
pH: 5 (ref 5.0–8.0)

## 2022-06-11 LAB — COMPREHENSIVE METABOLIC PANEL
ALT: 106 U/L — ABNORMAL HIGH (ref 0–44)
AST: 141 U/L — ABNORMAL HIGH (ref 15–41)
Albumin: 1.9 g/dL — ABNORMAL LOW (ref 3.5–5.0)
Alkaline Phosphatase: 109 U/L (ref 38–126)
Anion gap: 9 (ref 5–15)
BUN: 97 mg/dL — ABNORMAL HIGH (ref 8–23)
CO2: 22 mmol/L (ref 22–32)
Calcium: 7.5 mg/dL — ABNORMAL LOW (ref 8.9–10.3)
Chloride: 105 mmol/L (ref 98–111)
Creatinine, Ser: 4.82 mg/dL — ABNORMAL HIGH (ref 0.44–1.00)
GFR, Estimated: 9 mL/min — ABNORMAL LOW (ref >60)
Glucose, Bld: 92 mg/dL (ref 70–99)
Potassium: 3.3 mmol/L — ABNORMAL LOW (ref 3.5–5.1)
Sodium: 136 mmol/L (ref 135–145)
Total Bilirubin: <0.1 mg/dL — ABNORMAL LOW (ref 0.3–1.2)
Total Protein: 5.1 g/dL — ABNORMAL LOW (ref 6.5–8.1)

## 2022-06-11 LAB — LACTIC ACID, PLASMA: Lactic Acid, Venous: 0.6 mmol/L (ref 0.5–1.9)

## 2022-06-11 LAB — CBC
HCT: 22.3 % — ABNORMAL LOW (ref 36.0–46.0)
Hemoglobin: 7.5 g/dL — ABNORMAL LOW (ref 12.0–15.0)
MCH: 30.4 pg (ref 26.0–34.0)
MCHC: 33.6 g/dL (ref 30.0–36.0)
MCV: 90.3 fL (ref 80.0–100.0)
Platelets: 130 10*3/uL — ABNORMAL LOW (ref 150–400)
RBC: 2.47 MIL/uL — ABNORMAL LOW (ref 3.87–5.11)
RDW: 15.3 % (ref 11.5–15.5)
WBC: 3.1 10*3/uL — ABNORMAL LOW (ref 4.0–10.5)
nRBC: 0 % (ref 0.0–0.2)

## 2022-06-11 LAB — GLUCOSE, CAPILLARY: Glucose-Capillary: 80 mg/dL (ref 70–99)

## 2022-06-11 LAB — IRON AND TIBC
Iron: 63 ug/dL (ref 28–170)
Saturation Ratios: 47 % — ABNORMAL HIGH (ref 10.4–31.8)
TIBC: 134 ug/dL — ABNORMAL LOW (ref 250–450)
UIBC: 71 ug/dL

## 2022-06-11 LAB — FERRITIN: Ferritin: 676 ng/mL — ABNORMAL HIGH (ref 11–307)

## 2022-06-11 LAB — PROCALCITONIN: Procalcitonin: 0.21 ng/mL

## 2022-06-11 LAB — PHOSPHORUS: Phosphorus: 8.6 mg/dL — ABNORMAL HIGH (ref 2.5–4.6)

## 2022-06-11 MED ORDER — DOXYCYCLINE HYCLATE 100 MG IV SOLR
100.0000 mg | Freq: Two times a day (BID) | INTRAVENOUS | Status: DC
  Administered 2022-06-11 – 2022-06-14 (×7): 100 mg via INTRAVENOUS
  Filled 2022-06-11 (×8): qty 100

## 2022-06-11 MED ORDER — DARBEPOETIN ALFA 60 MCG/0.3ML IJ SOSY
60.0000 ug | PREFILLED_SYRINGE | Freq: Once | INTRAMUSCULAR | Status: AC
  Administered 2022-06-11: 60 ug via SUBCUTANEOUS
  Filled 2022-06-11: qty 0.3

## 2022-06-11 MED ORDER — SODIUM CHLORIDE 0.9 % IV SOLN: INTRAVENOUS | Status: DC | PRN

## 2022-06-11 MED ORDER — POTASSIUM CHLORIDE CRYS ER 20 MEQ PO TBCR
40.0000 meq | EXTENDED_RELEASE_TABLET | Freq: Once | ORAL | Status: AC
  Administered 2022-06-11: 40 meq via ORAL
  Filled 2022-06-11: qty 2

## 2022-06-11 MED ORDER — SODIUM CHLORIDE 0.9 % IV SOLN
2.0000 g | Freq: Every day | INTRAVENOUS | Status: DC
  Administered 2022-06-11 – 2022-06-14 (×4): 2 g via INTRAVENOUS
  Filled 2022-06-11 (×4): qty 20

## 2022-06-11 NOTE — Progress Notes (Signed)
Progress Note   Patient: Kara Mullins WUJ:[id number] DOB: 1951/09/18 DOA: 06/02/2022     9 DOS: the patient was seen and examined on 06/11/2022   Brief hospital course: 71 year old with history of HTN, HLD, CHF with preserved EF, paroxysmal A-fib, CKD stage IV admitted for worsening dyspnea on exertion over the past 2 days.  Follows with outpatient cardiology and nephrology.  Upon admission she was noted to have hypertensive urgency and signs of CHF exacerbation complicated by AKI on CKD stage IV.  Started on high-dose IV diuretics Lasix.  Assessment and Plan: Acute respiratory distress Acute congestive heart failure with preserved ejection fraction, 60%.  Class IV Anasarca/left sided effusion. -Echo in January 2023 showed EF 55% with grade 2 DD.  - Echocardiogram- ef 55%, g1DD, large left effusion.  - Cont fluid restriction.  Monitor input and output -PhosLo held per Nephrology -Pt noted to be very slow to diurese, Nephrology offered starting HD. Pt wishes to continue diuresis. -IV lasix was changed to torsemide given concerns of rising BUN and Cr   Anemia, likely from chronic disease - No obvious signs of blood loss.  Pt s/p 1 unit PRBC this visit -Hgb stable thus far    Elevated troponin Flat, chest pain-free.  Demand ischemia.   Acute kidney injury superimposed on chronic kidney disease stage IV -Baseline creatinine 3, admission creatinine 4.  Recently Foley catheter change.  Getting high-dose diuretics with albumin.  Phosphorus is up, will defer to nephro to manage this.  Unfortunately it appears patient is heading towards dialysis - discontinued losartan   Essential hypertension, uncontrolled - On hydralazine 50 mg 3 times daily and Coreg -Now on torsemide -Normal sinus rhythm, not on anticoagulation   Hypocalcemia Stable Corrected Ca 9.18   Subclinical hypothyroidism TSH slightly elevated but T4 normal.   Transaminitis Suspect from hepatic congestion.  Will  monitor   Hypoalbuminemia Chronic malnutrition.  Encourage PO as tolerated   Debility Due to worsening swelling and pulmonary edema -Therapy recs for HHPT/OT at time of d/c  Hypothermia -New event overnight. Pt noted to have rectal temp down to 93.24F overnight, requiring Bear Hugger -CXR and UA reviewed. Findings notable for vasc congestion and pulm edema with worsenerd B pleural effusions L>R with worsening aeration at R base and persistent dense consolidation of L base -UA suggestive of UTI. Urine cx ordered, pending -Empiric rocephin and doxy started overnight. Temp now normal, will cont regimen -Blood cx pending       Subjective: States feeling better today. LE edema improving  Physical Exam: Vitals:   06/10/22 2220 06/10/22 2359 06/11/22 0244 06/11/22 0623  BP: 120/60 130/71 (!) 142/66 120/67  Pulse: (!) 58 (!) 56 60 65  Resp: 16 16 14 15   Temp: (!) 93.8 F (34.3 C) (!) 93.3 F (34.1 C) (!) 94.3 F (34.6 C) (!) 97.1 F (36.2 C)  TempSrc: Rectal Rectal Rectal Rectal  SpO2:  96% 96% 97%  Weight:    99 kg  Height:       General exam: Awake, laying in bed, in nad Respiratory system: Normal respiratory effort, no wheezing Cardiovascular system: regular rate, s1, s2 Gastrointestinal system: Soft, nondistended, positive BS Central nervous system: CN2-12 grossly intact, strength intact Extremities: Perfused, BLE edema improving Skin: Normal skin turgor, no notable skin lesions seen Psychiatry: Mood normal // no visual hallucinations   Data Reviewed:  Labs reviewed: Na 136, K 3.3, Cr 4.82  Family Communication: Pt in room, family at bedside  Disposition: Status is:  Inpatient Remains inpatient appropriate because: Severity of illness  Planned Discharge Destination: Home with Home Health    Author: Marylu Lund, MD 06/11/2022 2:38 PM  For on call review [email or web address].

## 2022-06-11 NOTE — Progress Notes (Signed)
Heart Failure Nurse Navigator Progress Note  Completed assessment for HV TOC clinic. Completed and updated SDOH, no immediate social needs noted. Daughter and son-in-law life with patient. She is primarily wheelchair bound per pt statement. She needs assistance to toilet and bathe. Pt states she has ramp and will usually be wheeled in, occasionally utilizes walker. Has BSC at home too. Pt states daughter sets up medication, provides transportation and does shopping and cooking.  Educated on dietary modifications with sodium and fluid restrictions. Gave HF booklet. Encouraged monitoring daily weights.  Medication regimen limited by renal function, nephrology following. Current SCr 4.82 this AM. Foley catheter in place. TED hose and SCD on, 1-2+ pitting edema noted in ankles.   Due to renal function and limited functional status will defer HV TOC as pt would not benefit. Primary cardiology: Dr. Gardiner Rhyme. Pt very happy with his care provided, would like to see him as needed upon discharge.   Pricilla Holm, MSN, RN

## 2022-06-11 NOTE — Progress Notes (Signed)
Patient reassessed an remains hypothermic at this time. BP 130/71 (BP Location: Left Arm)   Pulse (!) 56   Temp (!) 93.3 F (34.1 C) (Rectal)   Resp 16   Ht 5\' 5"  (1.651 m)   Wt 97.4 kg   SpO2 96%   BMI 35.73 kg/m    Warming blanket applied.

## 2022-06-11 NOTE — Progress Notes (Signed)
[facility] NEPHROLOGY PROGRESS NOTE  Assessment/ Plan: Pt is a 71 y.o. yo female  progressive CKD, nephrotic syndrome of unclear etiology f/b Dr. Royce Macadamia, recurrent volume overload/HFpEF.  #Acute kidney injury on CKD stage IV with severe nephrotic syndrome, hypervolemia: Reportedly the serologies and work-up of nephrotic syndrome including amyloidosis unremarkable. She was receiving high-dose IV Lasix which was switched to oral torsemide because of worsening renal labs.  Currently on torsemide 50 mg twice a day, BUN and creatinine level slightly uptrending.  No sign or symptoms of uremia at this point to warrant urgent dialysis.  She does not want dialysis if possible. Continue daily lab, strict ins and out to monitor renal function.  #Acute CHF with preserved EF/anasarca/left-sided pleural effusion: Plan for diuretics as above.  Continue salt and fluid restriction.  #Acute respiratory distress/hypothermia concerning for pneumonia and CHF.  Diuretics as above.  Currently on antibiotics per primary team.  She is currently on room air.  #Hypertension/volume: Continue current cardiac/antihypertensive medications and diuretics as discussed above.  #CKD-MBD, hyperphosphatemia: It seems like the patient did not tolerate calcium acetate therefore changed to Renvela.  #Anemia of CKD: Iron saturation 47 with high ferritin.  I will order a dose of Aranesp.    #Hypokalemia: Due to diuretics, replete potassium chloride.  Monitor lab.  Subjective: Seen and examined at bedside.  Overnight event noted as patient became hypothermic concerning for pneumonia therefore on antibiotics.  She denies shortness of breath and currently on room air.  No chest pain, dysgeusia, nausea or vomiting.  Urine output is around 1.7 L in 24 hours.  Objective Vital signs in last 24 hours: Vitals:   06/10/22 2220 06/10/22 2359 06/11/22 0244 06/11/22 0623  BP: 120/60 130/71 (!) 142/66 120/67  Pulse: (!) 58 (!)  56 60 65  Resp: 16 16 14 15   Temp: (!) 93.8 F (34.3 C) (!) 93.3 F (34.1 C) (!) 94.3 F (34.6 C) (!) 97.1 F (36.2 C)  TempSrc: Rectal Rectal Rectal Rectal  SpO2:  96% 96% 97%  Weight:    99 kg  Height:       Weight change: 1.6 kg  Intake/Output Summary (Last 24 hours) at 06/11/2022 1026 Last data filed at 06/11/2022 0800 Gross per 24 hour  Intake 426 ml  Output 1750 ml  Net -1324 ml        Labs: RENAL PANEL Recent Labs    01/01/22 0351 01/02/22 0410 03/27/22 0214 03/27/22 2671 03/28/22 0526 04/07/22 2458 04/08/22 0457 04/22/22 0000 06/02/22 0998 06/02/22 3382 06/03/22 5053 06/04/22 9767 06/05/22 3419 06/06/22 0440 06/07/22 0540 06/08/22 3790 06/09/22 0352 06/10/22 0747 06/11/22 0105 06/11/22 0114  NA 143 141   < >  --    < > 143 142   < > 142 144 143 143 141  141 141 141 137  138 137 139 136  --   K 4.1 4.1   < >  --    < > 3.7 4.0   < > 3.8 3.8 3.6 3.4* 3.6  3.6 3.8 3.8 5.4*  3.6 3.4* 3.6 3.3*  --   CL 119* 116*   < >  --    < > 109 108   < > 111 110 110 110 111  110 110 111 109  108 107 108 105  --   CO2 16* 15*   < >  --    < > 28 29   < > 23  --  24 21* 23  22 22 20* 17*  21* 22 22 22   --   GLUCOSE 86 69*   < >  --    < > 76 79   < > 82 76 71 90 63*  64* 88 71 93  96 81 77 92  --   BUN 88* 83*   < >  --    < > 62* 64*   < > 78* 84* 79* 79* 81*  83* 85* 92* 94*  92* 93* 94* 97*  --   CREATININE 5.63* 5.45*   < >  --    < > 3.47* 3.50*   < > 4.05* 4.10* 4.10* 4.11* 4.21*  4.30* 4.19* 4.40* 4.44*  4.42* 4.42* 4.68* 4.82*  --   CALCIUM 6.9* 6.7*   < >  --    < > 7.9* 7.6*   < > 7.3*  --  7.4* 7.3* 7.5*  7.5* 7.8* 8.0* 7.9*  8.0* 7.7* 7.7* 7.5*  --   MG 2.9* 3.1*  --  2.3  --   --   --   --   --   --   --  2.4 2.4 2.4 2.4 2.7* 2.5* 2.7*  --   --   PHOS 10.0* 9.6*  --   --    < > 5.5* 5.6*  --   --   --  7.9* 7.5* 7.6*  7.7* 7.7* 7.7* 7.7* 8.0*  --   --  8.6*  ALBUMIN 1.7* 1.6*   < >  --    < > 1.8* 1.7*   < > 1.6*  --  <1.5*  <1.5* 2.0*  2.4* 2.4* 2.3* 2.3* 2.1* 2.0* 1.9*  --    < > = values in this interval not displayed.      Liver Function Tests: Recent Labs  Lab 06/09/22 0352 06/10/22 0747 06/11/22 0105  AST  --  128* 141*  ALT  --  94* 106*  ALKPHOS  --  114 109  BILITOT  --  0.4 <0.1*  PROT  --  5.5* 5.1*  ALBUMIN 2.1* 2.0* 1.9*    No results for input(s): "LIPASE", "AMYLASE" in the last 168 hours. No results for input(s): "AMMONIA" in the last 168 hours. CBC: Recent Labs    12/27/21 0425 12/28/21 0400 12/31/21 0422 12/31/21 2045 01/01/22 0351 01/02/22 0410 03/27/22 0214 04/01/22 1357 04/02/22 0446 05/17/22 0000 06/02/22 0311 06/07/22 0540 06/08/22 0639 06/09/22 0352 06/10/22 0747 06/11/22 0105  HGB 8.0*   < > 6.9*   < > 8.6* 8.8*   < >  --    < >  --    < > 7.5* 8.1* 7.7* 7.5* 7.5*  MCV 87.0   < > 89.6  --  88.8 87.5   < >  --    < >  --    < > 90.9 91.9 91.6 91.4 90.3  VITAMINB12 725  --   --   --   --   --   --   --   --   --   --   --   --   --   --   --   FOLATE 18.6  --   --   --   --   --   --   --   --   --   --   --   --   --   --   --   FERRITIN 618*  349*   < >  605*  --  584* 575*  --  593*  --   --   --   --   --   --   --  676*  TIBC 104*  --   --   --   --   --   --  112*  --  120  --   --   --   --   --  134*  IRON 53  --   --   --   --   --   --  40  --  38  --   --   --   --   --  63  RETICCTPCT 1.5  --   --   --   --   --   --   --   --   --   --   --   --   --   --   --    < > = values in this interval not displayed.     Cardiac Enzymes: No results for input(s): "CKTOTAL", "CKMB", "CKMBINDEX", "TROPONINI" in the last 168 hours. CBG: Recent Labs  Lab 06/11/22 0616  GLUCAP 80    Iron Studies:  Recent Labs    06/11/22 0105  IRON 63  TIBC 134*  FERRITIN 676*   Studies/Results: DG Chest 1 View  Result Date: 06/10/2022 CLINICAL DATA:  Pneumonia EXAM: CHEST  1 VIEW COMPARISON:  06/02/2022, CT 03/05/2021, radiograph 03/27/2022 FINDINGS: Cardiomegaly with  vascular congestion, pulmonary edema and at least moderate left greater than right pleural effusions. Basilar airspace disease. Findings appear worse when compared to 06/02/2022. IMPRESSION: 1. Cardiomegaly with vascular congestion and pulmonary edema. 2. Worsened bilateral pleural effusions left greater than right at least moderate in size with worsening aeration of the right base. Persistent dense consolidation in the left lung base Electronically Signed   By: Donavan Foil M.D.   On: 06/10/2022 22:52    Medications: Infusions:  cefTRIAXone (ROCEPHIN)  IV 2 g (06/11/22 0929)   doxycycline (VIBRAMYCIN) IV 100 mg (06/11/22 0646)    Scheduled Medications:  carvedilol  6.25 mg Oral BID WC   heparin  5,000 Units Subcutaneous Q8H   hydrALAZINE  50 mg Oral Q8H   sevelamer carbonate  1,600 mg Oral TID WC   sodium chloride flush  3 mL Intravenous Q12H   torsemide  50 mg Oral BID    have reviewed scheduled and prn medications.  Physical Exam: General:NAD, comfortable Heart:RRR, s1s2 nl Lungs: Bibasilar rhonchi, no increased work of breathing.  No wheezing. Abdomen:soft, Non-tender, non-distended Extremities:  leg  edema + Neurology: Alert, awake, no asterixis  Lacretia Tindall Prasad Jaquitta Dupriest 06/11/2022,10:26 AM  LOS: 9 days

## 2022-06-12 DIAGNOSIS — N179 Acute kidney failure, unspecified: Secondary | ICD-10-CM | POA: Diagnosis not present

## 2022-06-12 DIAGNOSIS — N184 Chronic kidney disease, stage 4 (severe): Secondary | ICD-10-CM | POA: Diagnosis not present

## 2022-06-12 DIAGNOSIS — N049 Nephrotic syndrome with unspecified morphologic changes: Secondary | ICD-10-CM | POA: Diagnosis not present

## 2022-06-12 DIAGNOSIS — I5033 Acute on chronic diastolic (congestive) heart failure: Secondary | ICD-10-CM | POA: Diagnosis not present

## 2022-06-12 LAB — RENAL FUNCTION PANEL
Albumin: 1.9 g/dL — ABNORMAL LOW (ref 3.5–5.0)
Anion gap: 6 (ref 5–15)
BUN: 99 mg/dL — ABNORMAL HIGH (ref 8–23)
CO2: 24 mmol/L (ref 22–32)
Calcium: 7.6 mg/dL — ABNORMAL LOW (ref 8.9–10.3)
Chloride: 107 mmol/L (ref 98–111)
Creatinine, Ser: 5 mg/dL — ABNORMAL HIGH (ref 0.44–1.00)
GFR, Estimated: 9 mL/min — ABNORMAL LOW (ref >60)
Glucose, Bld: 82 mg/dL (ref 70–99)
Phosphorus: 8.7 mg/dL — ABNORMAL HIGH (ref 2.5–4.6)
Potassium: 3.4 mmol/L — ABNORMAL LOW (ref 3.5–5.1)
Sodium: 137 mmol/L (ref 135–145)

## 2022-06-12 LAB — URINALYSIS, ROUTINE W REFLEX MICROSCOPIC
Bilirubin Urine: NEGATIVE
Glucose, UA: 50 mg/dL — AB
Hgb urine dipstick: NEGATIVE
Ketones, ur: NEGATIVE mg/dL
Leukocytes,Ua: NEGATIVE
Nitrite: NEGATIVE
Protein, ur: >=300 mg/dL — AB
Specific Gravity, Urine: 1.009 (ref 1.005–1.030)
pH: 5 (ref 5.0–8.0)

## 2022-06-12 LAB — CBC
HCT: 22.2 % — ABNORMAL LOW (ref 36.0–46.0)
Hemoglobin: 7 g/dL — ABNORMAL LOW (ref 12.0–15.0)
MCH: 29.2 pg (ref 26.0–34.0)
MCHC: 31.5 g/dL (ref 30.0–36.0)
MCV: 92.5 fL (ref 80.0–100.0)
Platelets: 142 10*3/uL — ABNORMAL LOW (ref 150–400)
RBC: 2.4 MIL/uL — ABNORMAL LOW (ref 3.87–5.11)
RDW: 15.5 % (ref 11.5–15.5)
WBC: 3.6 10*3/uL — ABNORMAL LOW (ref 4.0–10.5)
nRBC: 0 % (ref 0.0–0.2)

## 2022-06-12 LAB — PREPARE RBC (CROSSMATCH)

## 2022-06-12 LAB — MRSA NEXT GEN BY PCR, NASAL: MRSA by PCR Next Gen: DETECTED — AB

## 2022-06-12 MED ORDER — ALBUMIN HUMAN 25 % IV SOLN
25.0000 g | Freq: Four times a day (QID) | INTRAVENOUS | Status: AC
  Administered 2022-06-12 – 2022-06-13 (×3): 25 g via INTRAVENOUS
  Filled 2022-06-12 (×3): qty 100

## 2022-06-12 MED ORDER — FERRIC CITRATE 1 GM 210 MG(FE) PO TABS
630.0000 mg | ORAL_TABLET | Freq: Three times a day (TID) | ORAL | Status: DC
  Administered 2022-06-12 – 2022-06-14 (×4): 630 mg via ORAL
  Filled 2022-06-12 (×5): qty 3

## 2022-06-12 MED ORDER — CHLORHEXIDINE GLUCONATE CLOTH 2 % EX PADS
6.0000 | MEDICATED_PAD | Freq: Every day | CUTANEOUS | Status: DC
Start: 2022-06-12 — End: 2022-06-17
  Administered 2022-06-12 – 2022-06-15 (×4): 6 via TOPICAL

## 2022-06-12 MED ORDER — POTASSIUM CHLORIDE 20 MEQ PO PACK
40.0000 meq | PACK | Freq: Two times a day (BID) | ORAL | Status: AC
  Administered 2022-06-12 – 2022-06-13 (×3): 40 meq via ORAL
  Filled 2022-06-12 (×3): qty 2

## 2022-06-12 MED ORDER — POTASSIUM CHLORIDE CRYS ER 20 MEQ PO TBCR: 40.0000 meq | EXTENDED_RELEASE_TABLET | Freq: Two times a day (BID) | ORAL | Status: DC

## 2022-06-12 MED ORDER — SODIUM CHLORIDE 0.9% IV SOLUTION: Freq: Once | INTRAVENOUS | Status: DC

## 2022-06-12 MED ORDER — MUPIROCIN 2 % EX OINT
1.0000 | TOPICAL_OINTMENT | Freq: Two times a day (BID) | CUTANEOUS | Status: AC
  Administered 2022-06-12 – 2022-06-16 (×10): 1 via NASAL
  Filled 2022-06-12: qty 22

## 2022-06-12 NOTE — Progress Notes (Addendum)
Urine culture and U/A obtained from exchanged foley catheter.

## 2022-06-12 NOTE — Plan of Care (Signed)
?  Problem: Education: ?Goal: Ability to demonstrate management of disease process will improve ?Outcome: Progressing ?  ?Problem: Activity: ?Goal: Capacity to carry out activities will improve ?Outcome: Progressing ?  ?Problem: Cardiac: ?Goal: Ability to achieve and maintain adequate cardiopulmonary perfusion will improve ?Outcome: Progressing ?  ?

## 2022-06-12 NOTE — Progress Notes (Signed)
   06/12/22 1506  Vitals  Temp (!) 93.8 F (34.3 C)  Temp Source Axillary  BP 138/66  MAP (mmHg) 86  BP Location Right Arm  BP Method Automatic  Patient Position (if appropriate) Lying  Pulse Rate (!) 53  Pulse Rate Source Monitor  ECG Heart Rate (!) 53  Resp 10  MEWS COLOR  MEWS Score Color Yellow  Oxygen Therapy  SpO2 99 %  O2 Device Room Air  Pain Assessment  Pain Scale 0-10  Pain Score 0  MEWS Score  MEWS Temp 2  MEWS Systolic 0  MEWS Pulse 0  MEWS RR 1  MEWS LOC 0  MEWS Score 3    RN obtained VS to initiate blood transfusion. RN obtained axillary of 93.8. Bair Hugger applied onto patient. Amin MD paged and notified. MD verbal order to hold blood transfusion until temperature is at normal range. Rapid Response RN notified.   1545 Rapid Response RN rounded. Obtained rectal temperature of 94.8. Continue [employer] [facility] [employer].   RN will recheck temp q2h.

## 2022-06-12 NOTE — Plan of Care (Signed)
  Problem: Activity: Goal: Capacity to carry out activities will improve Outcome: Progressing   Problem: Cardiac: Goal: Ability to achieve and maintain adequate cardiopulmonary perfusion will improve Outcome: Progressing   Problem: Clinical Measurements: Goal: Ability to maintain clinical measurements within normal limits will improve Outcome: Progressing

## 2022-06-12 NOTE — Progress Notes (Addendum)
[facility] NEPHROLOGY PROGRESS NOTE  Assessment/ Plan: Pt is a 71 y.o. yo female  progressive CKD, nephrotic syndrome of unclear etiology f/b Dr. Royce Macadamia, recurrent volume overload/HFpEF.  #Acute kidney injury on CKD stage IV with severe nephrotic syndrome, hypervolemia: Reportedly the serologies and work-up of nephrotic syndrome including amyloidosis unremarkable. She was receiving high-dose IV Lasix which was switched to oral torsemide because of worsening renal labs.  Currently on torsemide 50 mg twice a day, BUN and creatinine level slightly uptrending.  No sign or symptoms of uremia at this point to warrant urgent dialysis.  She again confirms with me that she does not want dialysis.  May consider palliative care consult to address goals of care.  I will add albumin to increase intravascular volume/renal perfusion. Continue daily lab, strict ins and out to monitor renal function.  #Acute CHF with preserved EF/anasarca/left-sided pleural effusion: Plan for diuretics as above.  Continue salt and fluid restriction.  #Acute respiratory distress/hypothermia concerning for pneumonia and CHF.  Diuretics as above.  Currently on antibiotics per primary team.  She is currently on room air.  #Hypertension/volume: Continue current cardiac/antihypertensive medications and diuretics as discussed above.  #CKD-MBD, hyperphosphatemia: It seems like the patient did not tolerate calcium acetate.  The phosphorus remains elevated on Renvela therefore I am switching to [place].  #Anemia of CKD: Iron saturation 47 with high ferritin.  Received Aranesp on 8/29.  Noted plan for PRBC transfusion today.  #Hypokalemia: Due to diuretics, replete potassium chloride.  Monitor lab.  Subjective: Seen and examined at bedside.  Urine output is recorded around 1.4 L.  She denies nausea, vomiting, dysgeusia, chest pain or shortness of breath.  She reports feeling much better today.  Objective Vital signs in  last 24 hours: Vitals:   06/11/22 1613 06/11/22 2101 06/12/22 0300 06/12/22 0454  BP: (!) 122/100 (!) 152/65 (!) 132/55 (!) 132/55  Pulse: 64 63 (!) 59 (!) 59  Resp: 14 18 12 18   Temp:  (!) 97.4 F (36.3 C) (!) 97.4 F (36.3 C) (!) 97.4 F (36.3 C)  TempSrc:  Oral Oral Oral  SpO2: 98% 98% 98% 98%  Weight:    95 kg  Height:       Weight change: -4 kg  Intake/Output Summary (Last 24 hours) at 06/12/2022 1032 Last data filed at 06/12/2022 0727 Gross per 24 hour  Intake 1345.72 ml  Output 1580 ml  Net -234.28 ml        Labs: RENAL PANEL Recent Labs    01/01/22 0351 01/02/22 0410 03/27/22 0214 03/27/22 9449 03/28/22 0526 04/08/22 0457 04/22/22 0000 06/03/22 6759 06/04/22 1638 06/05/22 4665 06/06/22 0440 06/07/22 0540 06/08/22 9935 06/09/22 0352 06/10/22 0747 06/11/22 0105 06/11/22 0114 06/12/22 0644  NA 143 141   < >  --    < > 142   < > 143 143 141  141 141 141 137  138 137 139 136  --  137  K 4.1 4.1   < >  --    < > 4.0   < > 3.6 3.4* 3.6  3.6 3.8 3.8 5.4*  3.6 3.4* 3.6 3.3*  --  3.4*  CL 119* 116*   < >  --    < > 108   < > 110 110 111  110 110 111 109  108 107 108 105  --  107  CO2 16* 15*   < >  --    < > 29   < >  24 21* 23  22 22  20* 17*  21* 22 22 22   --  24  GLUCOSE 86 69*   < >  --    < > 79   < > 71 90 63*  64* 88 71 93  96 81 77 92  --  82  BUN 88* 83*   < >  --    < > 64*   < > 79* 79* 81*  83* 85* 92* 94*  92* 93* 94* 97*  --  99*  CREATININE 5.63* 5.45*   < >  --    < > 3.50*   < > 4.10* 4.11* 4.21*  4.30* 4.19* 4.40* 4.44*  4.42* 4.42* 4.68* 4.82*  --  5.00*  CALCIUM 6.9* 6.7*   < >  --    < > 7.6*   < > 7.4* 7.3* 7.5*  7.5* 7.8* 8.0* 7.9*  8.0* 7.7* 7.7* 7.5*  --  7.6*  MG 2.9* 3.1*  --  2.3  --   --   --   --  2.4 2.4 2.4 2.4 2.7* 2.5* 2.7*  --   --   --   PHOS 10.0* 9.6*  --   --    < > 5.6*  --  7.9* 7.5* 7.6*  7.7* 7.7* 7.7* 7.7* 8.0*  --   --  8.6* 8.7*  ALBUMIN 1.7* 1.6*   < >  --    < > 1.7*   < > <1.5*  <1.5* 2.0* 2.4*  2.4* 2.3* 2.3* 2.1* 2.0* 1.9*  --  1.9*   < > = values in this interval not displayed.      Liver Function Tests: Recent Labs  Lab 06/10/22 0747 06/11/22 0105 06/12/22 0644  AST 128* 141*  --   ALT 94* 106*  --   ALKPHOS 114 109  --   BILITOT 0.4 <0.1*  --   PROT 5.5* 5.1*  --   ALBUMIN 2.0* 1.9* 1.9*    No results for input(s): "LIPASE", "AMYLASE" in the last 168 hours. No results for input(s): "AMMONIA" in the last 168 hours. CBC: Recent Labs    12/27/21 0425 12/28/21 0400 12/31/21 0422 12/31/21 2045 01/01/22 0351 01/02/22 0410 03/27/22 0214 04/01/22 1357 04/02/22 0446 05/17/22 0000 06/02/22 0311 06/08/22 0639 06/09/22 0352 06/10/22 0747 06/11/22 0105 06/12/22 0644  HGB 8.0*   < > 6.9*   < > 8.6* 8.8*   < >  --    < >  --    < > 8.1* 7.7* 7.5* 7.5* 7.0*  MCV 87.0   < > 89.6  --  88.8 87.5   < >  --    < >  --    < > 91.9 91.6 91.4 90.3 92.5  VITAMINB12 725  --   --   --   --   --   --   --   --   --   --   --   --   --   --   --   FOLATE 18.6  --   --   --   --   --   --   --   --   --   --   --   --   --   --   --   FERRITIN 618*  349*   < > 605*  --  584* 575*  --  593*  --   --   --   --   --   --  676*  --   TIBC 104*  --   --   --   --   --   --  112*  --  120  --   --   --   --  134*  --   IRON 53  --   --   --   --   --   --  40  --  38  --   --   --   --  63  --   RETICCTPCT 1.5  --   --   --   --   --   --   --   --   --   --   --   --   --   --   --    < > = values in this interval not displayed.     Cardiac Enzymes: No results for input(s): "CKTOTAL", "CKMB", "CKMBINDEX", "TROPONINI" in the last 168 hours. CBG: Recent Labs  Lab 06/11/22 0616  GLUCAP 80     Iron Studies:  Recent Labs    06/11/22 0105  IRON 63  TIBC 134*  FERRITIN 676*    Studies/Results: DG Chest 1 View  Result Date: 06/10/2022 CLINICAL DATA:  Pneumonia EXAM: CHEST  1 VIEW COMPARISON:  06/02/2022, CT 03/05/2021, radiograph 03/27/2022 FINDINGS: Cardiomegaly with  vascular congestion, pulmonary edema and at least moderate left greater than right pleural effusions. Basilar airspace disease. Findings appear worse when compared to 06/02/2022. IMPRESSION: 1. Cardiomegaly with vascular congestion and pulmonary edema. 2. Worsened bilateral pleural effusions left greater than right at least moderate in size with worsening aeration of the right base. Persistent dense consolidation in the left lung base Electronically Signed   By: Donavan Foil M.D.   On: 06/10/2022 22:52    Medications: Infusions:  sodium chloride 10 mL/hr at 06/12/22 0006   cefTRIAXone (ROCEPHIN)  IV Stopped (06/11/22 8811)   doxycycline (VIBRAMYCIN) IV Stopped (06/11/22 2313)    Scheduled Medications:  sodium chloride   Intravenous Once   carvedilol  6.25 mg Oral BID WC   Chlorhexidine Gluconate Cloth  6 each Topical Q0600   heparin  5,000 Units Subcutaneous Q8H   hydrALAZINE  50 mg Oral Q8H   mupirocin ointment  1 Application Nasal BID   sevelamer carbonate  1,600 mg Oral TID WC   sodium chloride flush  3 mL Intravenous Q12H   torsemide  50 mg Oral BID    have reviewed scheduled and prn medications.  Physical Exam: General:NAD, comfortable Heart:RRR, s1s2 nl Lungs: Clear bilateral, no increased work of breathing Abdomen:soft, Non-tender, non-distended Extremities:  leg  edema + Neurology: Alert, awake, no asterixis  Koree Staheli Prasad Concha Sudol 06/12/2022,10:32 AM  LOS: 10 days

## 2022-06-12 NOTE — Progress Notes (Signed)
Occupational Therapy Treatment Patient Details Name: Kara Mullins MRN: [id number] DOB: 04/17/1951 Today's Date: 06/12/2022   History of present illness Pt is 71 yo female who presented to [facility] on 06/02/22 with progressive SOB. Pt with hypertensive urgency and CHF exacerbation complicated by AKI on CKD. Pt possibly heading toward HD. PMH: HTN, HLD, HF, Afib, CKD IV   OT comments  Patient in supine upon arrival and agreeable to OT session. Patient required mod assist to get to EOB due to assistance with BLEs. Patient was able to perform light grooming seated on EOB. Patient stood into stedy and rested on pads. Patient able to perform 5 stands with patient tolerating 30-60 seconds and 2 minutes on last stand before returning to EOB. Patient is very motivated and discharge recommendations continue to be appropriate.    Recommendations for follow up therapy are one component of a multi-disciplinary discharge planning process, led by the attending physician.  Recommendations may be updated based on patient status, additional functional criteria and insurance authorization.    Follow Up Recommendations  Home health OT    Assistance Recommended at Discharge Frequent or constant Supervision/Assistance  Patient can return home with the following  Two people to help with walking and/or transfers;A lot of help with bathing/dressing/bathroom;Assistance with cooking/housework;Direct supervision/assist for financial management;Direct supervision/assist for medications management;Assist for transportation;Help with stairs or ramp for entrance   Equipment Recommendations  None recommended by OT;Other (comment)    Recommendations for Other Services      Precautions / Restrictions Precautions Precautions: Fall Restrictions Weight Bearing Restrictions: No       Mobility Bed Mobility Overal bed mobility: Needs Assistance Bed Mobility: Supine to Sit     Supine to sit: Mod assist     General bed  mobility comments: assistance with BLEs to get to EOB    Transfers Overall transfer level: Needs assistance Equipment used: Ambulation equipment used Charlaine Dalton) Transfers: Sit to/from Stand Sit to Stand: Min assist           General transfer comment: 5 sit to stands performed from Felicity with patient tolerating 30 seconds to 2 minutes     Balance Overall balance assessment: Needs assistance Sitting-balance support: Feet supported, No upper extremity supported Sitting balance-Leahy Scale: Good Sitting balance - Comments: left sitting on EOB at end of session   Standing balance support: During functional activity, Reliant on assistive device for balance, Bilateral upper extremity supported Standing balance-Leahy Scale: Poor Standing balance comment: reliant on Stedy for support                           ADL either performed or assessed with clinical judgement   ADL Overall ADL's : Needs assistance/impaired     Grooming: Wash/dry hands;Wash/dry face;Supervision/safety;Sitting Grooming Details (indicate cue type and reason): on EOB                               General ADL Comments: focused on standing from EOB    Extremity/Trunk Assessment              Vision       Perception     Praxis      Cognition Arousal/Alertness: Awake/alert Behavior During Therapy: WFL for tasks assessed/performed Overall Cognitive Status: Within Functional Limits for tasks assessed  General Comments: motivated towards increasing mobility        Exercises      Shoulder Instructions       General Comments      Pertinent Vitals/ Pain       Pain Assessment Pain Assessment: Faces Faces Pain Scale: Hurts little more Pain Location: RLE Pain Descriptors / Indicators: Sore, Discomfort Pain Intervention(s): Limited activity within patient's tolerance, Monitored during session, Repositioned  Home Living                                           Prior Functioning/Environment              Frequency  Min 2X/week        Progress Toward Goals  OT Goals(current goals can now be found in the care plan section)  Progress towards OT goals: Progressing toward goals  Acute Rehab OT Goals Patient Stated Goal: get better OT Goal Formulation: With patient Time For Goal Achievement: 06/17/22 Potential to Achieve Goals: Good ADL Goals Pt Will Perform Upper Body Bathing: with min assist;bed level Pt Will Perform Lower Body Bathing: with mod assist;bed level Additional ADL Goal #1: pt will perform bed mobility min A in prep for ADLs  Plan Discharge plan remains appropriate;Frequency remains appropriate    Co-evaluation                 AM-PAC OT "6 Clicks" Daily Activity     Outcome Measure   Help from another person eating meals?: None Help from another person taking care of personal grooming?: None Help from another person toileting, which includes using toliet, bedpan, or urinal?: Total Help from another person bathing (including washing, rinsing, drying)?: A Lot Help from another person to put on and taking off regular upper body clothing?: A Little Help from another person to put on and taking off regular lower body clothing?: A Lot 6 Click Score: 16    End of Session Equipment Utilized During Treatment: Other (comment) Charlaine Dalton)  OT Visit Diagnosis: Other abnormalities of gait and mobility (R26.89);Unsteadiness on feet (R26.81);Muscle weakness (generalized) (M62.81)   Activity Tolerance Patient tolerated treatment well   Patient Left in bed;with call bell/phone within reach;Other (comment) (seated on EOB)   Nurse Communication Mobility status        Time: 1657-9038 OT Time Calculation (min): 28 min  Charges: OT General Charges $OT Visit: 1 Visit OT Treatments $Self Care/Home Management : 8-22 mins $Therapeutic Activity: 8-22 mins  Lodema Hong,  OTA Acute Rehabilitation Services  Office [phone]   Trixie Dredge 06/12/2022, 12:52 PM

## 2022-06-12 NOTE — Plan of Care (Signed)
  Problem: Education: Goal: Ability to demonstrate management of disease process will improve Outcome: Progressing   Problem: Cardiac: Goal: Ability to achieve and maintain adequate cardiopulmonary perfusion will improve Outcome: Progressing

## 2022-06-12 NOTE — Progress Notes (Signed)
Progress Note   Patient: Kara Mullins BWG:[id number] DOB: 1951-05-02 DOA: 06/02/2022     10 DOS: the patient was seen and examined on 06/12/2022   Brief hospital course: 71 year old with history of HTN, HLD, CHF with preserved EF, paroxysmal A-fib, CKD stage IV admitted for worsening dyspnea on exertion over the past 2 days.  Follows with outpatient cardiology and nephrology.  Upon admission she was noted to have hypertensive urgency and signs of CHF exacerbation complicated by AKI on CKD stage IV.  Started on high-dose IV diuretics Lasix.  Assessment and Plan: Acute respiratory distress Acute congestive heart failure with preserved ejection fraction, 60%.  Class IV Anasarca/left sided effusion. -Echo in January 2023 showed EF 55% with grade 2 DD.  - Echocardiogram- ef 55%, g1DD, large left effusion.  - Cont fluid restriction.  Monitor input and output -PhosLo held per Nephrology -Pt noted to be very slow to diurese, Nephrology offered starting HD. Pt wishes to continue diuresis. -IV lasix was changed to torsemide given concerns of rising BUN and Cr   Anemia, likely from chronic disease - Hemoglobin at 7.  Agrees to get 1 more unit of blood which was ordered.  Received another unit prior during this current hospitalization    Elevated troponin Flat, chest pain-free.  Demand ischemia.   Acute kidney injury superimposed on chronic kidney disease stage IV -Baseline creatinine 3, admission creatinine 4.  Recently Foley catheter change.  Getting high-dose diuretics with albumin.  Phosphorus is up, will defer to nephro to manage this.  Unfortunately it appears patient is heading towards dialysis - discontinued losartan   Essential hypertension, uncontrolled - On hydralazine 50 mg 3 times daily and Coreg -Now on torsemide -Normal sinus rhythm, not on anticoagulation   Hypocalcemia Stable Corrected Ca 9.18   Subclinical hypothyroidism TSH slightly elevated but T4 normal.    Transaminitis Suspect from hepatic congestion.  Will monitor   Hypoalbuminemia Chronic malnutrition.  Encourage PO as tolerated   Debility Due to worsening swelling and pulmonary edema -Therapy recs for HHPT/OT at time of d/c  Hypothermia Another episode of hypothermia, when vitals were checked for blood transfusion.  Similar episode happened couple of days ago.  Concern of UTI and she was started on ceftriaxone and doxycycline. Foley catheter was exchanged and cultures were sent. Blood cultures at that time remain negative. Patient improved with bear hugger. -Patient was given another bear hugger -Will repeat blood culture -Continue with antibiotics and follow-up cultures   Subjective: Patient was seen and examined today.  She was feeling much improved when seen today.  Still not ready for dialysis.  Stating that she is eating okay, no nausea or vomiting. Later paged by nursing staff that patient developed hypothermia.  Physical Exam: Vitals:   06/12/22 1225 06/12/22 1300 06/12/22 1352 06/12/22 1506  BP: (!) 155/72 (!) 158/66 138/65 138/66  Pulse: (!) 55 (!) 55  (!) 53  Resp: 13 14  10   Temp:  98.4 F (36.9 C)  (!) 93.8 F (34.3 C)  TempSrc:  Oral  Axillary  SpO2: 100% 98%  99%  Weight:      Height:       General.  Frail lady, in no acute distress. Pulmonary.  Lungs clear bilaterally, normal respiratory effort. CV.  Regular rate and rhythm, no JVD, rub or murmur. Abdomen.  Soft, nontender, nondistended, BS positive. CNS.  Alert and oriented .  No focal neurologic deficit. Extremities.  2+ LE edema, no cyanosis, pulses intact and symmetrical. Psychiatry.  Judgment and insight appears normal.   Data Reviewed: Prior data reviewed  Family Communication: Discussed with patient  Disposition: Status is: Inpatient Remains inpatient appropriate because: Severity of illness  Planned Discharge Destination: Home with Home Health  DVT prophylaxis.  Subcu heparin  This  record has been created using Systems analyst. Errors have been sought and corrected,but may not always be located. Such creation errors do not reflect on the standard of care.   Author: Lorella Nimrod, MD 06/12/2022 3:30 PM  For on call review [email or web address].

## 2022-06-12 NOTE — Progress Notes (Signed)
Physical Therapy Treatment Patient Details Name: Kara Mullins MRN: [id number] DOB: Nov 30, 1950 Today's Date: 06/12/2022   History of Present Illness Pt is 71 yo female who presented to [facility] on 06/02/22 with progressive SOB. Pt with hypertensive urgency and CHF exacerbation complicated by AKI on CKD. Pt possibly heading toward HD. PMH: HTN, HLD, HF, Afib, CKD IV    PT Comments    Pt received EOB and willing to attempt OOB mobility. Session focused on transfer training and standing tolerance for increased functional independence, activity tolerance and LE strength. Pt able to laterally scoot from EOB to drop arm recliner with mod assist and use of bed pad for laterally movement toward L. From recliner pt able to come to standing in stedy frame with min assist to power up and complete 2 trials of 60mins in standing and standing therex. Pt with fair tolerance for seated therex for increased ROM and strength. Pt continues to require up to mod assist to return to supine to bring BLE into bed. Encouraged pt to continue to elevate BLE throughout day for edema control with pt verbalizing understanding. Pt continues to benefit from skilled PT services to progress toward functional mobility goals.    Recommendations for follow up therapy are one component of a multi-disciplinary discharge planning process, led by the attending physician.  Recommendations may be updated based on patient status, additional functional criteria and insurance authorization.  Follow Up Recommendations  Home health PT     Assistance Recommended at Discharge Frequent or constant Supervision/Assistance  Patient can return home with the following Two people to help with walking and/or transfers;A lot of help with bathing/dressing/bathroom;Assist for transportation;Help with stairs or ramp for entrance;Assistance with cooking/housework   Equipment Recommendations  Other (comment) (looking into stedys for home, may need hoyer lift)     Recommendations for Other Services       Precautions / Restrictions Precautions Precautions: Fall Restrictions Weight Bearing Restrictions: No     Mobility  Bed Mobility Overal bed mobility: Needs Assistance Bed Mobility: Sit to Supine       Sit to supine: Mod assist   General bed mobility comments: pt seated EOB on arrival, mod asssit to return BLE to supine for IV team arrival    Transfers Overall transfer level: Needs assistance Equipment used: Ambulation equipment used, None Transfers: Sit to/from Stand, Bed to chair/wheelchair/BSC Sit to Stand: Min assist          Lateral/Scoot Transfers: Mod assist General transfer comment: min a to stand from recliner to steady, mod asssit and use of bed pad to lateral scoot from EOB to drop arm recliner toward L    Ambulation/Gait             Pre-gait activities: Stood in stedy, worked on [employer] shifting- able to clear LLE without difficulty during weight shifting, able to minimally move RLE in a shuffling pattern but not lift up off stedy plates. Able to stand for 3 mins x2 leaning on forearms at times for support. General Gait Details: Unable to clear R foot from Recruitment consultant    Modified Rankin (Stroke Patients Only)       Balance Overall balance assessment: Needs assistance Sitting-balance support: Feet supported, No upper extremity supported Sitting balance-Leahy Scale: Good Sitting balance - Comments: Worked on trunk rotation, side bending x10 bilaterally sitting on stedy seat with and without UE support.  Standing balance support: During functional activity, Reliant on assistive device for balance, Bilateral upper extremity supported Standing balance-Leahy Scale: Poor Standing balance comment: Able to stand in stedy and perform there ex.                            Cognition Arousal/Alertness: Awake/alert Behavior During Therapy: WFL for  tasks assessed/performed Overall Cognitive Status: Within Functional Limits for tasks assessed                                 General Comments: pt very plesant, cooperative and motivated        Exercises General Exercises - Lower Extremity Long Arc Quad: AROM, Right, Left, 10 reps, Seated, AAROM Hip Flexion/Marching: AROM, Right, Left, 10 reps, Seated, AAROM    General Comments        Pertinent Vitals/Pain Pain Assessment Pain Assessment: Faces Faces Pain Scale: Hurts little more Pain Location: RLE Pain Descriptors / Indicators: Sore, Discomfort Pain Intervention(s): Limited activity within patient's tolerance, Monitored during session, Repositioned    Home Living                          Prior Function            PT Goals (current goals can now be found in the care plan section) Acute Rehab PT Goals Patient Stated Goal: to walk PT Goal Formulation: With patient Time For Goal Achievement: 06/17/22    Frequency    Min 3X/week      PT Plan Current plan remains appropriate    Co-evaluation              AM-PAC PT "6 Clicks" Mobility   Outcome Measure  Help needed turning from your back to your side while in a flat bed without using bedrails?: A Lot Help needed moving from lying on your back to sitting on the side of a flat bed without using bedrails?: A Lot Help needed moving to and from a bed to a chair (including a wheelchair)?: Total Help needed standing up from a chair using your arms (e.g., wheelchair or bedside chair)?: Total Help needed to walk in hospital room?: Total Help needed climbing 3-5 steps with a railing? : Total 6 Click Score: 8    End of Session   Activity Tolerance: Patient tolerated treatment well Patient left: with call bell/phone within reach;in bed;Other (comment) (with IV team present) Nurse Communication: Mobility status PT Visit Diagnosis: Muscle weakness (generalized) (M62.81);Difficulty in  walking, not elsewhere classified (R26.2)     Time: 4782-9562 PT Time Calculation (min) (ACUTE ONLY): 29 min  Charges:  $Therapeutic Exercise: 8-22 mins $Therapeutic Activity: 8-22 mins                    Aissatou Fronczak R. PTA Acute Rehabilitation Services Office: [place] 06/12/2022, 11:30 AM

## 2022-06-13 DIAGNOSIS — I5033 Acute on chronic diastolic (congestive) heart failure: Secondary | ICD-10-CM | POA: Diagnosis not present

## 2022-06-13 LAB — TYPE AND SCREEN
ABO/RH(D): AB POS
Antibody Screen: NEGATIVE
Unit division: 0

## 2022-06-13 LAB — RENAL FUNCTION PANEL
Albumin: 2.5 g/dL — ABNORMAL LOW (ref 3.5–5.0)
Anion gap: 10 (ref 5–15)
BUN: 95 mg/dL — ABNORMAL HIGH (ref 8–23)
CO2: 22 mmol/L (ref 22–32)
Calcium: 7.8 mg/dL — ABNORMAL LOW (ref 8.9–10.3)
Chloride: 107 mmol/L (ref 98–111)
Creatinine, Ser: 4.93 mg/dL — ABNORMAL HIGH (ref 0.44–1.00)
GFR, Estimated: 9 mL/min — ABNORMAL LOW (ref >60)
Glucose, Bld: 73 mg/dL (ref 70–99)
Phosphorus: 8.1 mg/dL — ABNORMAL HIGH (ref 2.5–4.6)
Potassium: 3.5 mmol/L (ref 3.5–5.1)
Sodium: 139 mmol/L (ref 135–145)

## 2022-06-13 LAB — CBC
HCT: 23.2 % — ABNORMAL LOW (ref 36.0–46.0)
Hemoglobin: 7.7 g/dL — ABNORMAL LOW (ref 12.0–15.0)
MCH: 29.4 pg (ref 26.0–34.0)
MCHC: 33.2 g/dL (ref 30.0–36.0)
MCV: 88.5 fL (ref 80.0–100.0)
Platelets: 147 10*3/uL — ABNORMAL LOW (ref 150–400)
RBC: 2.62 MIL/uL — ABNORMAL LOW (ref 3.87–5.11)
RDW: 15.8 % — ABNORMAL HIGH (ref 11.5–15.5)
WBC: 4.3 10*3/uL (ref 4.0–10.5)
nRBC: 0 % (ref 0.0–0.2)

## 2022-06-13 LAB — BPAM RBC
Blood Product Expiration Date: 202309292359
ISSUE DATE / TIME: 202308302033
Unit Type and Rh: 8400

## 2022-06-13 MED ORDER — POTASSIUM CHLORIDE 20 MEQ PO PACK
20.0000 meq | PACK | Freq: Every day | ORAL | Status: DC
  Administered 2022-06-14 – 2022-06-17 (×4): 20 meq via ORAL
  Filled 2022-06-13 (×5): qty 1

## 2022-06-13 NOTE — Progress Notes (Signed)
Occupational Therapy Treatment Patient Details Name: Kara Mullins MRN: [id number] DOB: [birth year] Today's Date: 06/13/2022   History of present illness Pt is 71 yo female who presented to [facility] on 06/02/22 with progressive SOB. Pt with hypertensive urgency and CHF exacerbation complicated by AKI on CKD. Pt possibly heading toward HD. PMH: HTN, HLD, HF, Afib, CKD IV   OT comments  Patient received in supine and eager to participate. Patient required mod assist to get to EOB due to difficulty with moving BLEs. Patient performed 3 stands from EOB to [facility] with min assist to stand and tolerated +2 minutes for each stand. Patient asked to return to supine with mod assist. Patient is increasing standing tolerance and would benefit from further OT services.    Recommendations for follow up therapy are one component of a multi-disciplinary discharge planning process, led by the attending physician.  Recommendations may be updated based on patient status, additional functional criteria and insurance authorization.    Follow Up Recommendations  Home health OT    Assistance Recommended at Discharge Frequent or constant Supervision/Assistance  Patient can return home with the following  Two people to help with walking and/or transfers;A lot of help with bathing/dressing/bathroom;Assistance with cooking/housework;Direct supervision/assist for financial management;Direct supervision/assist for medications management;Assist for transportation;Help with stairs or ramp for entrance   Equipment Recommendations  None recommended by OT;Other (comment)    Recommendations for Other Services      Precautions / Restrictions Precautions Precautions: Fall Restrictions Weight Bearing Restrictions: No       Mobility Bed Mobility Overal bed mobility: Needs Assistance Bed Mobility: Supine to Sit, Sit to Supine     Supine to sit: Mod assist Sit to supine: Mod assist   General bed mobility comments:  requires assistance for BLEs    Transfers Overall transfer level: Needs assistance Equipment used: Ambulation equipment used Charlaine Dalton) Transfers: Sit to/from Stand Sit to Stand: Min assist           General transfer comment: 3 stands performed fromEOB with Stedy and patient tolerating 2+ minutes for each stand Transfer via Lift Equipment: Stedy   Balance Overall balance assessment: Needs assistance Sitting-balance support: Feet supported, No upper extremity supported Sitting balance-Leahy Scale: Good     Standing balance support: During functional activity, Reliant on assistive device for balance, Bilateral upper extremity supported Standing balance-Leahy Scale: Poor Standing balance comment: reliant on Stedy for support                           ADL either performed or assessed with clinical judgement   ADL Overall ADL's : Needs assistance/impaired                                       General ADL Comments: focused on standing from EOB    Extremity/Trunk Assessment              Vision       Perception     Praxis      Cognition Arousal/Alertness: Awake/alert Behavior During Therapy: WFL for tasks assessed/performed Overall Cognitive Status: Within Functional Limits for tasks assessed                                          Exercises  Shoulder Instructions       General Comments      Pertinent Vitals/ Pain       Pain Assessment Pain Assessment: Faces Faces Pain Scale: Hurts little more Pain Location: RLE Pain Descriptors / Indicators: Sore, Discomfort Pain Intervention(s): Monitored during session, Repositioned  Home Living                                          Prior Functioning/Environment              Frequency  Min 2X/week        Progress Toward Goals  OT Goals(current goals can now be found in the care plan section)  Progress towards OT goals: Progressing  toward goals  Acute Rehab OT Goals Patient Stated Goal: get better and go home OT Goal Formulation: With patient Time For Goal Achievement: 06/17/22 Potential to Achieve Goals: Good ADL Goals Pt Will Perform Upper Body Bathing: with min assist;bed level Pt Will Perform Lower Body Bathing: with mod assist;bed level Additional ADL Goal #1: pt will perform bed mobility min A in prep for ADLs  Plan Discharge plan remains appropriate;Frequency remains appropriate    Co-evaluation                 AM-PAC OT "6 Clicks" Daily Activity     Outcome Measure   Help from another person eating meals?: None Help from another person taking care of personal grooming?: None Help from another person toileting, which includes using toliet, bedpan, or urinal?: Total Help from another person bathing (including washing, rinsing, drying)?: A Lot Help from another person to put on and taking off regular upper body clothing?: A Little Help from another person to put on and taking off regular lower body clothing?: A Lot 6 Click Score: 16    End of Session Equipment Utilized During Treatment: Other (comment) Charlaine Dalton)  OT Visit Diagnosis: Other abnormalities of gait and mobility (R26.89);Unsteadiness on feet (R26.81);Muscle weakness (generalized) (M62.81)   Activity Tolerance Patient tolerated treatment well   Patient Left in bed;with call bell/phone within reach   Nurse Communication Mobility status        Time: 1145-1210 OT Time Calculation (min): 25 min  Charges: OT General Charges $OT Visit: 1 Visit OT Treatments $Therapeutic Activity: 23-37 mins  Lodema Hong, [place]  Office [place] 06/13/2022, 12:46 PM

## 2022-06-13 NOTE — Progress Notes (Signed)
PROGRESS NOTE  Paddy Walthall MGQ:[id number] DOB: 1951/02/17 DOA: 06/02/2022 PCP: [name], NP  HPI/Recap of past 15 hours:  71 year old with history of HTN, HLD, CHF with preserved EF, paroxysmal A-fib, CKD stage IV admitted for worsening dyspnea on exertion over the past 2 days.  Follows with outpatient cardiology and nephrology.  Upon admission she was noted to have hypertensive urgency and signs of CHF exacerbation complicated by AKI on CKD stage IV.  Started on high-dose IV diuretics Lasix. She was switched to torsemide.  06/13/2022: The patient was seen and examined at bedside.  She is on antibiotics for, Rocephin and doxycycline.  On torsemide for acute CHF.  She feels her symptoms are improving however still feels weak.  She was assessed by PT OT with recommendation for home health PT OT.  Assessment/Plan: Principal Problem:   (HFpEF) heart failure with preserved ejection fraction (HCC) Active Problems:   Acute respiratory distress   Elevated troponin level not due myocardial infarction   Acute kidney injury superimposed on chronic kidney disease ([place])   Hypertensive urgency   Anasarca associated with disorder of kidney   Bilateral lower extremity edema   Paroxysmal atrial fibrillation (HCC)   Anemia due to chronic kidney disease   Hyperlipidemia   Debility   Hypocalcemia   Subclinical hypothyroidism   Transaminitis  Resolved acute respiratory distress Acute congestive heart failure with preserved ejection fraction, 60%.  Class IV Anasarca/left sided effusion. -Echo in January 2023 showed EF 55% with grade 2 DD.  - Echocardiogram- ef 55%, g1DD, large left effusion.  - Cont fluid restriction.  Monitor input and output -PhosLo held per Nephrology -Pt noted to be very slow to diurese, Nephrology offered starting HD. Pt wishes to continue diuresis. -IV lasix was changed to torsemide given concerns of rising BUN and Cr, continue.  Community-acquired pneumonia,  POA Continue Rocephin and doxycycline MRSA screening test positive   Anemia, likely from chronic disease - Hemoglobin at 7.  Agrees to get 1 more unit of blood which was ordered.  Received another unit prior during this current hospitalization Hemoglobin 7.7 on 06/13/2022.    Elevated troponin Flat, chest pain-free.  Demand ischemia.   Acute kidney injury superimposed on chronic kidney disease stage IV -Baseline creatinine 3, admission creatinine 4.  Recently Foley catheter change.  Getting high-dose diuretics with albumin.  Phosphorus is up, will defer to nephro to manage this.  Unfortunately it appears patient is heading towards dialysis - discontinued losartan   Essential hypertension, uncontrolled - On hydralazine 50 mg 3 times daily and Coreg -Now on torsemide -Normal sinus rhythm, not on anticoagulation   Hypocalcemia Stable Corrected Ca 9.18   Subclinical hypothyroidism TSH slightly elevated but T4 normal.   Transaminitis Suspect from hepatic congestion.  Will monitor   Hypoalbuminemia Chronic malnutrition.  Encourage PO as tolerated   Debility Due to worsening swelling and pulmonary edema -Therapy recs for HHPT/OT at time of d/c   Hypothermia Another episode of hypothermia, when vitals were checked for blood transfusion.  Similar episode happened couple of days ago.  Concern of UTI and she was started on ceftriaxone and doxycycline. Foley catheter was exchanged and cultures were sent. Blood cultures at that time remain negative. Patient improved with bear hugger. -Patient was given another bear hugger -Will repeat blood culture -Continue with antibiotics and follow-up cultures   Code Status: Full code  Family Communication: Updated her husband at bedside.  Disposition Plan: Anticipate discharge home 06/14/2022, home with home health  services.   Consultants: Nephrology.  Procedures: None.  Antimicrobials: Rocephin, IV doxycycline  DVT  prophylaxis: Subcu heparin 3 times daily  Status is: Inpatient The patient requires at least 2 midnights for further evaluation and treatment of present condition.    Objective: Vitals:   06/12/22 2230 06/12/22 2315 06/13/22 0355 06/13/22 0736  BP: (!) 132/55 (!) 141/59 (!) 152/63 (!) 164/69  Pulse: 66 67 68 63  Resp:  14 11 15   Temp:  98.2 F (36.8 C) 98.3 F (36.8 C) 98 F (36.7 C)  TempSrc:  Oral Oral Oral  SpO2:  96% 95% 97%  Weight:   95.5 kg   Height:        Intake/Output Summary (Last 24 hours) at 06/13/2022 0758 Last data filed at 06/13/2022 0557 Gross per 24 hour  Intake 1861.26 ml  Output 1550 ml  Net 311.26 ml   Filed Weights   06/11/22 0623 06/12/22 0454 06/13/22 0355  Weight: 99 kg 95 kg 95.5 kg    Exam:  General: 71 y.o. year-old female well developed well nourished in no acute distress.  Alert and oriented x3. Cardiovascular: Regular rate and rhythm with no rubs or gallops.  No thyromegaly or JVD noted.   Respiratory: Clear to auscultation with no wheezes or rales. Good inspiratory effort. Abdomen: Soft nontender nondistended with normal bowel sounds x4 quadrants. Musculoskeletal: 3+ pitting edema in lower extremities bilaterally. Skin: No ulcerative lesions noted or rashes, Psychiatry: Mood is appropriate for condition and setting   Data Reviewed: CBC: Recent Labs  Lab 06/09/22 0352 06/10/22 0747 06/11/22 0105 06/12/22 0644 06/13/22 0326  WBC 3.6* 3.1* 3.1* 3.6* 4.3  HGB 7.7* 7.5* 7.5* 7.0* 7.7*  HCT 23.9* 23.5* 22.3* 22.2* 23.2*  MCV 91.6 91.4 90.3 92.5 88.5  PLT 134* 138* 130* 142* 025*   Basic Metabolic Panel: Recent Labs  Lab 06/07/22 0540 06/08/22 0639 06/09/22 0352 06/10/22 0747 06/11/22 0105 06/11/22 0114 06/12/22 0644 06/13/22 0326  NA 141 137  138 137 139 136  --  137 139  K 3.8 5.4*  3.6 3.4* 3.6 3.3*  --  3.4* 3.5  CL 111 109  108 107 108 105  --  107 107  CO2 20* 17*  21* 22 22 22   --  24 22  GLUCOSE 71 93  96  81 77 92  --  82 73  BUN 92* 94*  92* 93* 94* 97*  --  99* 95*  CREATININE 4.40* 4.44*  4.42* 4.42* 4.68* 4.82*  --  5.00* 4.93*  CALCIUM 8.0* 7.9*  8.0* 7.7* 7.7* 7.5*  --  7.6* 7.8*  MG 2.4 2.7* 2.5* 2.7*  --   --   --   --   PHOS 7.7* 7.7* 8.0*  --   --  8.6* 8.7* 8.1*   GFR: Estimated Creatinine Clearance: 12.3 mL/min (A) (by C-G formula based on SCr of 4.93 mg/dL (H)). Liver Function Tests: Recent Labs  Lab 06/09/22 0352 06/10/22 0747 06/11/22 0105 06/12/22 0644 06/13/22 0326  AST  --  128* 141*  --   --   ALT  --  94* 106*  --   --   ALKPHOS  --  114 109  --   --   BILITOT  --  0.4 <0.1*  --   --   PROT  --  5.5* 5.1*  --   --   ALBUMIN 2.1* 2.0* 1.9* 1.9* 2.5*   No results for input(s): "LIPASE", "AMYLASE" in the last  168 hours. No results for input(s): "AMMONIA" in the last 168 hours. Coagulation Profile: No results for input(s): "INR", "PROTIME" in the last 168 hours. Cardiac Enzymes: No results for input(s): "CKTOTAL", "CKMB", "CKMBINDEX", "TROPONINI" in the last 168 hours. BNP (last 3 results) No results for input(s): "PROBNP" in the last 8760 hours. HbA1C: No results for input(s): "HGBA1C" in the last 72 hours. CBG: Recent Labs  Lab 06/11/22 0616  GLUCAP 80   Lipid Profile: No results for input(s): "CHOL", "HDL", "LDLCALC", "TRIG", "CHOLHDL", "LDLDIRECT" in the last 72 hours. Thyroid Function Tests: No results for input(s): "TSH", "T4TOTAL", "FREET4", "T3FREE", "THYROIDAB" in the last 72 hours. Anemia Panel: Recent Labs    06/11/22 0105  FERRITIN 676*  TIBC 134*  IRON 63   Urine analysis:    Component Value Date/Time   COLORURINE YELLOW 06/12/2022 0736   APPEARANCEUR CLEAR 06/12/2022 0736   LABSPEC 1.009 06/12/2022 0736   PHURINE 5.0 06/12/2022 0736   GLUCOSEU 50 (A) 06/12/2022 0736   HGBUR NEGATIVE 06/12/2022 0736   BILIRUBINUR NEGATIVE 06/12/2022 0736   KETONESUR NEGATIVE 06/12/2022 0736   PROTEINUR >=300 (A) 06/12/2022 0736    UROBILINOGEN 0.2 11/19/2019 2004   NITRITE NEGATIVE 06/12/2022 0736   LEUKOCYTESUR NEGATIVE 06/12/2022 0736   Sepsis Labs: @LABRCNTIP (procalcitonin:4,lacticidven:4)  ) Recent Results (from the past 240 hour(s))  Culture, blood (Routine X 2) w Reflex to ID Panel     Status: None (Preliminary result)   Collection Time: 06/10/22 10:54 PM   Specimen: BLOOD RIGHT HAND  Result Value Ref Range Status   Specimen Description BLOOD RIGHT HAND  Final   Special Requests   Final    BOTTLES DRAWN AEROBIC AND ANAEROBIC Blood Culture results may not be optimal due to an inadequate volume of blood received in culture bottles   Culture   Final    NO GROWTH 3 DAYS Performed at [facility], [place] [address] [place], [place] [address]    Report Status PENDING  Incomplete  Culture, blood (Routine X 2) w Reflex to ID Panel     Status: None (Preliminary result)   Collection Time: 06/10/22 10:54 PM   Specimen: BLOOD  Result Value Ref Range Status   Specimen Description BLOOD LEFT ANTECUBITAL  Final   Special Requests   Final    BOTTLES DRAWN AEROBIC ONLY Blood Culture adequate volume   Culture   Final    NO GROWTH 3 DAYS Performed at [facility], [place] [address], [place], [place] [address]    Report Status PENDING  Incomplete  MRSA Next Gen by PCR, Nasal     Status: Abnormal   Collection Time: 06/12/22  5:42 AM   Specimen: Nasal Mucosa; Nasal Swab  Result Value Ref Range Status   MRSA by PCR Next Gen DETECTED (A) NOT DETECTED Final    Comment: RESULTS CALLED TO READ BACK BY AND VERIFIED WITH RN [email or web address] @0723  ON 06/12/22 BY NM (NOTE) The GeneXpert MRSA Assay (FDA approved for NASAL specimens only), is one component of a comprehensive MRSA colonization surveillance program. It is not intended to diagnose MRSA infection nor to guide or monitor treatment for MRSA infections. Test performance is not FDA approved in patients less than 39 years old. Performed at [facility], [place] [address], [place], [place] [address]   Culture, blood (Routine X 2) w Reflex to ID Panel     Status: None (Preliminary result)   Collection Time: 06/12/22  5:06 PM  Specimen: BLOOD  Result Value Ref Range Status   Specimen Description BLOOD RIGHT ANTECUBITAL  Final   Special Requests   Final    BOTTLES DRAWN AEROBIC AND ANAEROBIC Blood Culture adequate volume   Culture   Final    NO GROWTH < 12 HOURS Performed at [facility], [address], [place], [place] [address]    Report Status PENDING  Incomplete  Culture, blood (Routine X 2) w Reflex to ID Panel     Status: None (Preliminary result)   Collection Time: 06/12/22  5:12 PM   Specimen: BLOOD LEFT HAND  Result Value Ref Range Status   Specimen Description BLOOD LEFT HAND  Final   Special Requests   Final    BOTTLES DRAWN AEROBIC AND ANAEROBIC Blood Culture adequate volume   Culture   Final    NO GROWTH < 12 HOURS Performed at [facility], [place] [address], [place], [place] [address]    Report Status PENDING  Incomplete      Studies: No results found.  Scheduled Meds:  sodium chloride   Intravenous Once   carvedilol  6.25 mg Oral BID WC   Chlorhexidine Gluconate Cloth  6 each Topical Q0600   ferric citrate  630 mg Oral TID WC   heparin  5,000 Units Subcutaneous Q8H   hydrALAZINE  50 mg Oral Q8H   mupirocin ointment  1 Application Nasal BID   potassium chloride  40 mEq Oral BID   sodium chloride flush  3 mL Intravenous Q12H   torsemide  50 mg Oral BID    Continuous Infusions:  sodium chloride Stopped (06/13/22 0229)   cefTRIAXone (ROCEPHIN)  IV 2 g (06/12/22 1042)   doxycycline (VIBRAMYCIN) IV Stopped (06/13/22 0144)     LOS: 11 days     Kayleen Memos, MD [facility] Pager [phone]  If 7PM-7AM, please contact night-coverage [email or web address] Password [email or web address] 06/13/2022, 7:58 AM

## 2022-06-13 NOTE — Progress Notes (Signed)
[name]  Assessment/ Plan: Pt is a 71 y.o. yo female  progressive CKD, nephrotic syndrome of unclear etiology f/b Dr. Royce Mullins, recurrent volume overload/HFpEF.  #Acute kidney injury on CKD stage IV with severe nephrotic syndrome, hypervolemia: Reportedly the serologies and work-up of nephrotic syndrome including amyloidosis unremarkable. She was receiving high-dose IV Lasix which was switched to oral torsemide because of worsening renal labs. She is clinically much better, volume status acceptable on current diuretic regimen.  She is on torsemide 50 mg twice a day.  Both BUN and creatinine level seems to be plateaued.  She has no signs or symptoms of uremia.  She again confirms with me that she does not want dialysis.  May consider palliative care consult to address goals of care.  Fortunately, there is no need for dialysis at this time. Continue daily lab, strict ins and out to monitor renal function.  #Acute CHF with preserved EF/anasarca/left-sided pleural effusion: Plan for diuretics as above.  Continue salt and fluid restriction.  #Acute respiratory distress/hypothermia concerning for pneumonia and CHF.  Diuretics as above.  On antibiotics, room air.  #Hypertension/volume: Continue current cardiac/antihypertensive medications and diuretics as discussed above.  #CKD-MBD, hyperphosphatemia: It seems like the patient did not tolerate calcium acetate.  The phosphorus remains elevated on [place]a therefore switched to [place].  #Anemia of CKD: Iron saturation 47 with high ferritin.  Received Aranesp on 8/29.  Received blood transfusion.  #Hypokalemia: Due to diuretics, start potassium chloride daily.  Subjective: Seen and examined at bedside.  Urine output is around 1.6 L in 24 hours.  The patient said that she is feeling good without any new complaint or concern.  She denies chest pain, shortness of breath, dysgeusia, nausea,  vomiting.  Objective Vital signs in last 24 hours: Vitals:   06/12/22 2315 06/13/22 0355 06/13/22 0736 06/13/22 0812  BP: (!) 141/59 (!) 152/63 (!) 164/69 (!) 171/71  Pulse: 67 68 63 66  Resp: 14 11 15 15   Temp: 98.2 F (36.8 C) 98.3 F (36.8 C) 98 F (36.7 C)   TempSrc: Oral Oral Oral   SpO2: 96% 95% 97%   Weight:  95.5 kg    Height:       Weight change: 0.5 kg  Intake/Output Summary (Last 24 hours) at 06/13/2022 0951 Last data filed at 06/13/2022 0900 Gross per 24 hour  Intake 1981.26 ml  Output 2250 ml  Net -268.74 ml        Labs: RENAL PANEL Recent Labs    01/01/22 0351 01/02/22 0410 03/27/22 0214 03/27/22 1696 03/28/22 0526 06/03/22 7893 06/04/22 0449 06/05/22 8101 06/06/22 0440 06/07/22 0540 06/08/22 7510 06/09/22 0352 06/10/22 0747 06/11/22 0105 06/11/22 0114 06/12/22 0644 06/13/22 0326  NA 143 141   < >  --    < > 143 143 141  141 141 141 137  138 137 139 136  --  137 139  K 4.1 4.1   < >  --    < > 3.6 3.4* 3.6  3.6 3.8 3.8 5.4*  3.6 3.4* 3.6 3.3*  --  3.4* 3.5  CL 119* 116*   < >  --    < > 110 110 111  110 110 111 109  108 107 108 105  --  107 107  CO2 16* 15*   < >  --    < > 24 21* 23  22 22  20* 17*  21* 22 22 22   --  24 22  GLUCOSE 86 69*   < >  --    < > 71 90 63*  64* 88 71 93  96 81 77 92  --  82 73  BUN 88* 83*   < >  --    < > 79* 79* 81*  83* 85* 92* 94*  92* 93* 94* 97*  --  99* 95*  CREATININE 5.63* 5.45*   < >  --    < > 4.10* 4.11* 4.21*  4.30* 4.19* 4.40* 4.44*  4.42* 4.42* 4.68* 4.82*  --  5.00* 4.93*  CALCIUM 6.9* 6.7*   < >  --    < > 7.4* 7.3* 7.5*  7.5* 7.8* 8.0* 7.9*  8.0* 7.7* 7.7* 7.5*  --  7.6* 7.8*  MG 2.9* 3.1*  --  2.3  --   --  2.4 2.4 2.4 2.4 2.7* 2.5* 2.7*  --   --   --   --   PHOS 10.0* 9.6*  --   --    < > 7.9* 7.5* 7.6*  7.7* 7.7* 7.7* 7.7* 8.0*  --   --  8.6* 8.7* 8.1*  ALBUMIN 1.7* 1.6*   < >  --    < > <1.5*  <1.5* 2.0* 2.4* 2.4* 2.3* 2.3* 2.1* 2.0* 1.9*  --  1.9* 2.5*   < > = values in this  interval not displayed.      Liver Function Tests: Recent Labs  Lab 06/10/22 0747 06/11/22 0105 06/12/22 0644 06/13/22 0326  AST 128* 141*  --   --   ALT 94* 106*  --   --   ALKPHOS 114 109  --   --   BILITOT 0.4 <0.1*  --   --   PROT 5.5* 5.1*  --   --   ALBUMIN 2.0* 1.9* 1.9* 2.5*    No results for input(s): "LIPASE", "AMYLASE" in the last 168 hours. No results for input(s): "AMMONIA" in the last 168 hours. CBC: Recent Labs    12/27/21 0425 12/28/21 0400 12/31/21 0422 12/31/21 2045 01/01/22 0351 01/02/22 0410 03/27/22 0214 04/01/22 1357 04/02/22 0446 05/17/22 0000 06/02/22 0311 06/09/22 0352 06/10/22 0747 06/11/22 0105 06/12/22 0644 06/13/22 0326  HGB 8.0*   < > 6.9*   < > 8.6* 8.8*   < >  --    < >  --    < > 7.7* 7.5* 7.5* 7.0* 7.7*  MCV 87.0   < > 89.6  --  88.8 87.5   < >  --    < >  --    < > 91.6 91.4 90.3 92.5 88.5  VITAMINB12 725  --   --   --   --   --   --   --   --   --   --   --   --   --   --   --   FOLATE 18.6  --   --   --   --   --   --   --   --   --   --   --   --   --   --   --   FERRITIN 618*  349*   < > 605*  --  584* 575*  --  593*  --   --   --   --   --  676*  --   --   TIBC 104*  --   --   --   --   --   --  112*  --  120  --   --   --  134*  --   --   IRON 53  --   --   --   --   --   --  40  --  38  --   --   --  63  --   --   RETICCTPCT 1.5  --   --   --   --   --   --   --   --   --   --   --   --   --   --   --    < > = values in this interval not displayed.     Cardiac Enzymes: No results for input(s): "CKTOTAL", "CKMB", "CKMBINDEX", "TROPONINI" in the last 168 hours. CBG: Recent Labs  Lab 06/11/22 0616  GLUCAP 80     Iron Studies:  Recent Labs    06/11/22 0105  IRON 63  TIBC 134*  FERRITIN 676*    Studies/Results: No results found.  Medications: Infusions:  sodium chloride Stopped (06/13/22 0229)   cefTRIAXone (ROCEPHIN)  IV 2 g (06/13/22 0857)   doxycycline (VIBRAMYCIN) IV Stopped (06/13/22 0144)     Scheduled Medications:  sodium chloride   Intravenous Once   carvedilol  6.25 mg Oral BID WC   Chlorhexidine Gluconate Cloth  6 each Topical Q0600   ferric citrate  630 mg Oral TID WC   heparin  5,000 Units Subcutaneous Q8H   hydrALAZINE  50 mg Oral Q8H   mupirocin ointment  1 Application Nasal BID   potassium chloride  40 mEq Oral BID   sodium chloride flush  3 mL Intravenous Q12H   torsemide  50 mg Oral BID    have reviewed scheduled and prn medications.  Physical Exam: General:NAD, comfortable Heart:RRR, s1s2 nl Lungs: Clear bilateral, no wheeze or crackle Abdomen:soft, Non-tender, non-distended Extremities:  leg  edema +, looks chronic Neurology: Alert, awake, no asterixis  Kara Mullins Kara Mullins 06/13/2022,9:51 AM  LOS: 11 days

## 2022-06-13 NOTE — Progress Notes (Signed)
Patient complained of being nauseous throughout the night. Patient did not want PRN ondansetron. Daughter at bedside and encouraged patient to eat more while she is on the antibiotics.

## 2022-06-13 NOTE — Plan of Care (Signed)
?  Problem: Education: ?Goal: Ability to demonstrate management of disease process will improve ?Outcome: Progressing ?  ?Problem: Activity: ?Goal: Capacity to carry out activities will improve ?Outcome: Progressing ?  ?Problem: Cardiac: ?Goal: Ability to achieve and maintain adequate cardiopulmonary perfusion will improve ?Outcome: Progressing ?  ?

## 2022-06-13 NOTE — Plan of Care (Signed)
°  Problem: Education: °Goal: Ability to demonstrate management of disease process will improve °Outcome: Progressing °  °Problem: Education: °Goal: Ability to verbalize understanding of medication therapies will improve °Outcome: Progressing °  °Problem: Activity: °Goal: Capacity to carry out activities will improve °Outcome: Progressing °  °

## 2022-06-14 DIAGNOSIS — R0603 Acute respiratory distress: Secondary | ICD-10-CM | POA: Diagnosis not present

## 2022-06-14 DIAGNOSIS — N049 Nephrotic syndrome with unspecified morphologic changes: Secondary | ICD-10-CM | POA: Diagnosis not present

## 2022-06-14 DIAGNOSIS — I5033 Acute on chronic diastolic (congestive) heart failure: Secondary | ICD-10-CM | POA: Diagnosis not present

## 2022-06-14 DIAGNOSIS — N184 Chronic kidney disease, stage 4 (severe): Secondary | ICD-10-CM | POA: Diagnosis not present

## 2022-06-14 LAB — RENAL FUNCTION PANEL
Albumin: 2.2 g/dL — ABNORMAL LOW (ref 3.5–5.0)
Anion gap: 13 (ref 5–15)
BUN: 93 mg/dL — ABNORMAL HIGH (ref 8–23)
CO2: 22 mmol/L (ref 22–32)
Calcium: 8.1 mg/dL — ABNORMAL LOW (ref 8.9–10.3)
Chloride: 105 mmol/L (ref 98–111)
Creatinine, Ser: 4.99 mg/dL — ABNORMAL HIGH (ref 0.44–1.00)
GFR, Estimated: 9 mL/min — ABNORMAL LOW (ref >60)
Glucose, Bld: 82 mg/dL (ref 70–99)
Phosphorus: 8.4 mg/dL — ABNORMAL HIGH (ref 2.5–4.6)
Potassium: 3.5 mmol/L (ref 3.5–5.1)
Sodium: 140 mmol/L (ref 135–145)

## 2022-06-14 LAB — MAGNESIUM: Magnesium: 2.4 mg/dL (ref 1.7–2.4)

## 2022-06-14 LAB — CBC
HCT: 25.4 % — ABNORMAL LOW (ref 36.0–46.0)
Hemoglobin: 8.4 g/dL — ABNORMAL LOW (ref 12.0–15.0)
MCH: 29.3 pg (ref 26.0–34.0)
MCHC: 33.1 g/dL (ref 30.0–36.0)
MCV: 88.5 fL (ref 80.0–100.0)
Platelets: 169 10*3/uL (ref 150–400)
RBC: 2.87 MIL/uL — ABNORMAL LOW (ref 3.87–5.11)
RDW: 15.9 % — ABNORMAL HIGH (ref 11.5–15.5)
WBC: 4.1 10*3/uL (ref 4.0–10.5)
nRBC: 0 % (ref 0.0–0.2)

## 2022-06-14 LAB — URINE CULTURE: Culture: 50000 — AB

## 2022-06-14 MED ORDER — VITAMIN D 25 MCG (1000 UNIT) PO TABS
2000.0000 [IU] | ORAL_TABLET | Freq: Every day | ORAL | Status: DC
  Administered 2022-06-15 – 2022-06-17 (×3): 2000 [IU] via ORAL
  Filled 2022-06-14 (×4): qty 2

## 2022-06-14 MED ORDER — SEVELAMER CARBONATE 2.4 G PO PACK
2.4000 g | PACK | Freq: Three times a day (TID) | ORAL | Status: DC
  Administered 2022-06-14 – 2022-06-17 (×8): 2.4 g via ORAL
  Filled 2022-06-14 (×11): qty 1

## 2022-06-14 MED ORDER — AMOXICILLIN-POT CLAVULANATE 500-125 MG PO TABS
1.0000 | ORAL_TABLET | Freq: Two times a day (BID) | ORAL | Status: DC
  Administered 2022-06-14 – 2022-06-17 (×6): 500 mg via ORAL
  Filled 2022-06-14 (×8): qty 1

## 2022-06-14 NOTE — Progress Notes (Signed)
PROGRESS NOTE  Yarah Fuente BMW:[id number] DOB: 08/12/1951 DOA: 06/02/2022 PCP: [name], NP  HPI/Recap of past 27 hours:  71 year old with history of HTN, HLD, CHF with preserved EF, paroxysmal A-fib, CKD stage IV admitted for worsening dyspnea on exertion over the past 2 days.  Follows with outpatient cardiology and nephrology.  Upon admission she was noted to have hypertensive urgency and signs of CHF exacerbation complicated by AKI on CKD stage IV.  Started on high-dose IV diuretics Lasix. She was switched to torsemide.  06/13/2022: The patient was seen and examined at bedside.  She is on antibiotics for, Rocephin and doxycycline.  On torsemide for acute CHF.  She feels her symptoms are improving however still feels weak.  She was assessed by PT OT with recommendation for home health PT OT.  06/14/2022: Patient seen.  No new complaints.  Patient continues to refuse renal replacement therapy.  Significantly elevated phosphorus level.  PTH is 125.  Last vitamin D level was 7.6.  We will start patient on cholecalciferol.  Hemoglobin is 8.4 g/dL today.  Nephrology input is appreciated.  Patient is on high-dose torsemide (50 Mg p.o. twice daily).  Low threshold to consult the palliative care team.  Assessment/Plan: Principal Problem:   (HFpEF) heart failure with preserved ejection fraction (HCC) Active Problems:   Acute respiratory distress   Elevated troponin level not due myocardial infarction   Acute kidney injury superimposed on chronic kidney disease ([place])   Hypertensive urgency   Anasarca associated with disorder of kidney   Bilateral lower extremity edema   Paroxysmal atrial fibrillation (HCC)   Anemia due to chronic kidney disease   Hyperlipidemia   Debility   Hypocalcemia   Subclinical hypothyroidism   Transaminitis  Resolved acute respiratory distress Acute congestive heart failure with preserved ejection fraction, 60%.  Class IV Anasarca/left sided effusion. -Echo  in January 2023 showed EF 55% with grade 2 DD.  - Echocardiogram- ef 55%, g1DD, large left effusion.  - Cont fluid restriction.  Monitor input and output -PhosLo held per Nephrology -Pt noted to be very slow to diurese, Nephrology offered starting HD. Pt wishes to continue diuresis. -IV lasix was changed to torsemide given concerns of rising BUN and Cr, continue. 06/14/2022: Continue torsemide.  Patient continues to refuse renal replacement therapy.   Community-acquired pneumonia, POA Continue Rocephin and doxycycline MRSA screening test positive 06/14/2022: Rocephin has been changed to Augmentin.   Anemia, likely from chronic disease - Hemoglobin at 7.  Agrees to get 1 more unit of blood which was ordered.  Received another unit prior during this current hospitalization Hemoglobin 7.7 on 06/13/2022. 06/14/2022: Patient has anemia of chronic kidney disease versus multifactorial etiology.  Will defer need for iron and ESA to the nephrology team.  Hemoglobin is not at goal.    Elevated troponin Flat, chest pain-free.  Demand ischemia.   Acute kidney injury superimposed on chronic kidney disease stage IV -Baseline creatinine 3, admission creatinine 4.  Recently Foley catheter change.  Getting high-dose diuretics with albumin.  Phosphorus is up, will defer to nephro to manage this.  Unfortunately it appears patient is heading towards dialysis - discontinued losartan 06/14/2022: Suspect this may be a case of progressive chronic kidney disease, known CKD 5/ESRD.   Essential hypertension, uncontrolled - On hydralazine 50 mg 3 times daily and Coreg -Now on torsemide -Normal sinus rhythm, not on anticoagulation 06/14/2022: Blood pressures controlled.   Hypocalcemia Stable Corrected Ca 9.18   Subclinical hypothyroidism TSH slightly  elevated but T4 normal.   Transaminitis Suspect from hepatic congestion.  Will monitor   Hypoalbuminemia Chronic malnutrition.  Encourage PO as  tolerated 06/14/2022: Low threshold to consult podiatry team, considering CKD 5/possible ESRD.   Debility Due to worsening swelling and pulmonary edema -Therapy recs for HHPT/OT at time of d/c   Hypothermia Another episode of hypothermia, when vitals were checked for blood transfusion.  Similar episode happened couple of days ago.  Concern of UTI and she was started on ceftriaxone and doxycycline. Foley catheter was exchanged and cultures were sent. Blood cultures at that time remain negative. Patient improved with bear hugger. -Patient was given another bear hugger -Will repeat blood culture -Continue with antibiotics and follow-up cultures   Code Status: Full code  Family Communication: Updated her husband at bedside.  Disposition Plan: Anticipate discharge home 06/14/2022, home with home health services.   Consultants: Nephrology.  Procedures: None.  Antimicrobials: Rocephin, IV doxycycline  DVT prophylaxis: Subcu heparin 3 times daily  Status is: Inpatient The patient requires at least 2 midnights for further evaluation and treatment of present condition.    Objective: Vitals:   06/13/22 1642 06/13/22 1732 06/13/22 2048 06/14/22 0507  BP:   (!) 157/71 (!) 142/74  Pulse:   62 61  Resp: 11  12 13   Temp:  97.6 F (36.4 C) 98.7 F (37.1 C) 97.6 F (36.4 C)  TempSrc:  Oral Oral Oral  SpO2:   98% 99%  Weight:    96.6 kg  Height:        Intake/Output Summary (Last 24 hours) at 06/14/2022 0713 Last data filed at 06/14/2022 0500 Gross per 24 hour  Intake 781.7 ml  Output 2800 ml  Net -2018.3 ml    Filed Weights   06/12/22 0454 06/13/22 0355 06/14/22 0507  Weight: 95 kg 95.5 kg 96.6 kg    Exam:  General: Awake and alert.  Not in any distress.  Legs are wrapped.   HEENT: Patient is pale. CVS: S1-S2. Abdomen: Soft and nontender. Neuro: Awake and alert.  Moves all extremities. Lungs: Clear to auscultation. Extremities: Both lower legs are wrapped.      Data Reviewed: CBC: Recent Labs  Lab 06/10/22 0747 06/11/22 0105 06/12/22 0644 06/13/22 0326 06/14/22 0631  WBC 3.1* 3.1* 3.6* 4.3 4.1  HGB 7.5* 7.5* 7.0* 7.7* 8.4*  HCT 23.5* 22.3* 22.2* 23.2* 25.4*  MCV 91.4 90.3 92.5 88.5 88.5  PLT 138* 130* 142* 147* 948    Basic Metabolic Panel: Recent Labs  Lab 06/08/22 0639 06/09/22 0352 06/10/22 0747 06/11/22 0105 06/11/22 0114 06/12/22 0644 06/13/22 0326  NA 137  138 137 139 136  --  137 139  K 5.4*  3.6 3.4* 3.6 3.3*  --  3.4* 3.5  CL 109  108 107 108 105  --  107 107  CO2 17*  21* 22 22 22   --  24 22  GLUCOSE 93  96 81 77 92  --  82 73  BUN 94*  92* 93* 94* 97*  --  99* 95*  CREATININE 4.44*  4.42* 4.42* 4.68* 4.82*  --  5.00* 4.93*  CALCIUM 7.9*  8.0* 7.7* 7.7* 7.5*  --  7.6* 7.8*  MG 2.7* 2.5* 2.7*  --   --   --   --   PHOS 7.7* 8.0*  --   --  8.6* 8.7* 8.1*    GFR: Estimated Creatinine Clearance: 12.4 mL/min (A) (by C-G formula based on SCr of 4.93 mg/dL (  H)). Liver Function Tests: Recent Labs  Lab 06/09/22 0352 06/10/22 0747 06/11/22 0105 06/12/22 0644 06/13/22 0326  AST  --  128* 141*  --   --   ALT  --  94* 106*  --   --   ALKPHOS  --  114 109  --   --   BILITOT  --  0.4 <0.1*  --   --   PROT  --  5.5* 5.1*  --   --   ALBUMIN 2.1* 2.0* 1.9* 1.9* 2.5*    No results for input(s): "LIPASE", "AMYLASE" in the last 168 hours. No results for input(s): "AMMONIA" in the last 168 hours. Coagulation Profile: No results for input(s): "INR", "PROTIME" in the last 168 hours. Cardiac Enzymes: No results for input(s): "CKTOTAL", "CKMB", "CKMBINDEX", "TROPONINI" in the last 168 hours. BNP (last 3 results) No results for input(s): "PROBNP" in the last 8760 hours. HbA1C: No results for input(s): "HGBA1C" in the last 72 hours. CBG: Recent Labs  Lab 06/11/22 0616  GLUCAP 80    Lipid Profile: No results for input(s): "CHOL", "HDL", "LDLCALC", "TRIG", "CHOLHDL", "LDLDIRECT" in the last 72  hours. Thyroid Function Tests: No results for input(s): "TSH", "T4TOTAL", "FREET4", "T3FREE", "THYROIDAB" in the last 72 hours. Anemia Panel: No results for input(s): "VITAMINB12", "FOLATE", "FERRITIN", "TIBC", "IRON", "RETICCTPCT" in the last 72 hours.  Urine analysis:    Component Value Date/Time   COLORURINE YELLOW 06/12/2022 0736   APPEARANCEUR CLEAR 06/12/2022 0736   LABSPEC 1.009 06/12/2022 0736   PHURINE 5.0 06/12/2022 0736   GLUCOSEU 50 (A) 06/12/2022 0736   HGBUR NEGATIVE 06/12/2022 0736   BILIRUBINUR NEGATIVE 06/12/2022 0736   KETONESUR NEGATIVE 06/12/2022 0736   PROTEINUR >=300 (A) 06/12/2022 0736   UROBILINOGEN 0.2 11/19/2019 2004   NITRITE NEGATIVE 06/12/2022 0736   LEUKOCYTESUR NEGATIVE 06/12/2022 0736   Sepsis Labs: @LABRCNTIP (procalcitonin:4,lacticidven:4)  ) Recent Results (from the past 240 hour(s))  Culture, blood (Routine X 2) w Reflex to ID Panel     Status: None (Preliminary result)   Collection Time: 06/10/22 10:54 PM   Specimen: BLOOD RIGHT HAND  Result Value Ref Range Status   Specimen Description BLOOD RIGHT HAND  Final   Special Requests   Final    BOTTLES DRAWN AEROBIC AND ANAEROBIC Blood Culture results may not be optimal due to an inadequate volume of blood received in culture bottles   Culture   Final    NO GROWTH 4 DAYS Performed at [facility], [place] [address], [place], [place] [address]    Report Status PENDING  Incomplete  Culture, blood (Routine X 2) w Reflex to ID Panel     Status: None (Preliminary result)   Collection Time: 06/10/22 10:54 PM   Specimen: BLOOD  Result Value Ref Range Status   Specimen Description BLOOD LEFT ANTECUBITAL  Final   Special Requests   Final    BOTTLES DRAWN AEROBIC ONLY Blood Culture adequate volume   Culture   Final    NO GROWTH 4 DAYS Performed at [facility], [place] [address] [place], [place] [address]    Report Status PENDING  Incomplete  Remove urinary catheter to obtain Clean Catch  urine culture     Status: Abnormal (Preliminary result)   Collection Time: 06/11/22  2:38 PM   Specimen: Urine, Catheterized  Result Value Ref Range Status   Specimen Description URINE, CATHETERIZED  Final   Special Requests NONE  Final   Culture (A)  Final  50,000 COLONIES/mL ENTEROCOCCUS FAECALIS SUSCEPTIBILITIES TO FOLLOW Performed at [facility], [place] [address], [place], [place] [address]    Report Status PENDING  Incomplete  MRSA Next Gen by PCR, Nasal     Status: Abnormal   Collection Time: 06/12/22  5:42 AM   Specimen: Nasal Mucosa; Nasal Swab  Result Value Ref Range Status   MRSA by PCR Next Gen DETECTED (A) NOT DETECTED Final    Comment: RESULTS CALLED TO READ BACK BY AND VERIFIED WITH RN [email or web address] @0723  ON 06/12/22 BY NM (NOTE) The GeneXpert MRSA Assay (FDA approved for NASAL specimens only), is one component of a comprehensive MRSA colonization surveillance program. It is not intended to diagnose MRSA infection nor to guide or monitor treatment for MRSA infections. Test performance is not FDA approved in patients less than 74 years old. Performed at [facility], [place] [address], [place], [place] [address]   Culture, blood (Routine X 2) w Reflex to ID Panel     Status: None (Preliminary result)   Collection Time: 06/12/22  5:06 PM   Specimen: BLOOD  Result Value Ref Range Status   Specimen Description BLOOD RIGHT ANTECUBITAL  Final   Special Requests   Final    BOTTLES DRAWN AEROBIC AND ANAEROBIC Blood Culture adequate volume   Culture   Final    NO GROWTH 2 DAYS Performed at [facility], [place] [address], [place], [place] [address]    Report Status PENDING  Incomplete  Culture, blood (Routine X 2) w Reflex to ID Panel     Status: None (Preliminary result)   Collection Time: 06/12/22  5:12 PM   Specimen: BLOOD LEFT HAND  Result Value Ref Range Status   Specimen Description BLOOD LEFT HAND  Final   Special Requests   Final    BOTTLES DRAWN AEROBIC  AND ANAEROBIC Blood Culture adequate volume   Culture   Final    NO GROWTH 2 DAYS Performed at [facility], [place] [address], [place], [place] [address]    Report Status PENDING  Incomplete      Studies: No results found.  Scheduled Meds:  sodium chloride   Intravenous Once   carvedilol  6.25 mg Oral BID WC   Chlorhexidine Gluconate Cloth  6 each Topical Q0600   ferric citrate  630 mg Oral TID WC   heparin  5,000 Units Subcutaneous Q8H   hydrALAZINE  50 mg Oral Q8H   mupirocin ointment  1 Application Nasal BID   potassium chloride  20 mEq Oral Daily   sodium chloride flush  3 mL Intravenous Q12H   torsemide  50 mg Oral BID    Continuous Infusions:  sodium chloride Stopped (06/14/22 0013)   cefTRIAXone (ROCEPHIN)  IV 2 g (06/13/22 0857)   doxycycline (VIBRAMYCIN) IV Stopped (06/14/22 0004)     LOS: 12 days     Bonnell Public, MD [facility]   If 7PM-7AM, please contact night-coverage [email or web address] Password [email or web address] 06/14/2022, 7:13 AM

## 2022-06-14 NOTE — Progress Notes (Signed)
PT Cancellation Note  Patient Details Name: Kara Mullins MRN: [id number] DOB: [birth year]   Cancelled Treatment:    Reason Eval/Treat Not Completed: Medical issues which prohibited therapy. Attempted x 2 with pt nausea and vomiting.   [place] 06/14/2022, 11:00 AM [place] Office [phone]

## 2022-06-14 NOTE — Progress Notes (Signed)
[facility] NEPHROLOGY PROGRESS NOTE  Assessment/ Plan: Pt is a 71 y.o. yo female  progressive CKD, nephrotic syndrome of unclear etiology f/b Dr. Royce Macadamia, recurrent volume overload/HFpEF.  #Acute kidney injury on CKD stage IV with severe nephrotic syndrome, hypervolemia: Reportedly the serologies and work-up of nephrotic syndrome including amyloidosis unremarkable. She was receiving high-dose IV Lasix which was switched to oral torsemide because of worsening renal labs. She is clinically much better, volume status acceptable on current diuretic regimen.  She is on torsemide 50 mg twice a day.  Both BUN and creatinine level trending down.  She has no signs or symptoms of uremia.  She again confirms with me that she does not want dialysis. Fortunately, there is no need for dialysis at this time.  #Acute CHF with preserved EF/anasarca/left-sided pleural effusion: Plan for diuretics as above.  Continue salt and fluid restriction.  #Acute respiratory distress/hypothermia concerning for pneumonia and CHF.  Diuretics as above.  On antibiotics, room air.  #Hypertension/volume: Continue current cardiac/antihypertensive medications and diuretics as discussed above.  #CKD-MBD, hyperphosphatemia: It seems like the patient did not tolerate calcium acetate.  Switch binders to Renvela powder.  #Anemia of CKD: Iron saturation 47 with high ferritin.  Received Aranesp on 8/29.  Received blood transfusion.  #Hypokalemia: Due to diuretics, continue potassium chloride daily.  Nothing further to add. I will sign off, please call back with question.  I will send a message to the office to arrange appointment with Dr. Royce Macadamia.  Subjective: Seen and examined at bedside.  Urine output is around 2.8 L in 24 hours.  The patient denies chest pain, shortness of breath, nausea, vomiting, dysgeusia.  Objective Vital signs in last 24 hours: Vitals:   06/13/22 2048 06/14/22 0507 06/14/22 0721 06/14/22 0759   BP: (!) 157/71 (!) 142/74 123/66   Pulse: 62 61 60 61  Resp: 12 13 15    Temp: 98.7 F (37.1 C) 97.6 F (36.4 C) 97.9 F (36.6 C)   TempSrc: Oral Oral Oral   SpO2: 98% 99% 97%   Weight:  96.6 kg    Height:       Weight change: 1.1 kg  Intake/Output Summary (Last 24 hours) at 06/14/2022 1121 Last data filed at 06/14/2022 0825 Gross per 24 hour  Intake 781.7 ml  Output 2100 ml  Net -1318.3 ml        Labs: RENAL PANEL Recent Labs    01/02/22 0410 03/27/22 0214 03/27/22 3810 03/28/22 0526 06/04/22 0449 06/05/22 1751 06/06/22 0440 06/07/22 0540 06/08/22 0258 06/09/22 0352 06/10/22 0747 06/11/22 0105 06/11/22 0114 06/12/22 0644 06/13/22 0326 06/14/22 0631  NA 141   < >  --    < > 143 141  141 141 141 137  138 137 139 136  --  137 139 140  K 4.1   < >  --    < > 3.4* 3.6  3.6 3.8 3.8 5.4*  3.6 3.4* 3.6 3.3*  --  3.4* 3.5 3.5  CL 116*   < >  --    < > 110 111  110 110 111 109  108 107 108 105  --  107 107 105  CO2 15*   < >  --    < > 21* 23  22 22  20* 17*  21* 22 22 22   --  24 22 22   GLUCOSE 69*   < >  --    < > 90 63*  64* 88 71 93  96  81 77 92  --  82 73 82  BUN 83*   < >  --    < > 79* 81*  83* 85* 92* 94*  92* 93* 94* 97*  --  99* 95* 93*  CREATININE 5.45*   < >  --    < > 4.11* 4.21*  4.30* 4.19* 4.40* 4.44*  4.42* 4.42* 4.68* 4.82*  --  5.00* 4.93* 4.99*  CALCIUM 6.7*   < >  --    < > 7.3* 7.5*  7.5* 7.8* 8.0* 7.9*  8.0* 7.7* 7.7* 7.5*  --  7.6* 7.8* 8.1*  MG 3.1*  --  2.3  --  2.4 2.4 2.4 2.4 2.7* 2.5* 2.7*  --   --   --   --  2.4  PHOS 9.6*  --   --    < > 7.5* 7.6*  7.7* 7.7* 7.7* 7.7* 8.0*  --   --  8.6* 8.7* 8.1* 8.4*  ALBUMIN 1.6*   < >  --    < > 2.0* 2.4* 2.4* 2.3* 2.3* 2.1* 2.0* 1.9*  --  1.9* 2.5* 2.2*   < > = values in this interval not displayed.      Liver Function Tests: Recent Labs  Lab 06/10/22 0747 06/11/22 0105 06/12/22 0644 06/13/22 0326 06/14/22 0631  AST 128* 141*  --   --   --   ALT 94* 106*  --   --   --    ALKPHOS 114 109  --   --   --   BILITOT 0.4 <0.1*  --   --   --   PROT 5.5* 5.1*  --   --   --   ALBUMIN 2.0* 1.9* 1.9* 2.5* 2.2*    No results for input(s): "LIPASE", "AMYLASE" in the last 168 hours. No results for input(s): "AMMONIA" in the last 168 hours. CBC: Recent Labs    12/27/21 0425 12/28/21 0400 12/31/21 0422 12/31/21 2045 01/01/22 0351 01/02/22 0410 03/27/22 0214 04/01/22 1357 04/02/22 0446 05/17/22 0000 06/02/22 0311 06/10/22 0747 06/11/22 0105 06/12/22 0644 06/13/22 0326 06/14/22 0631  HGB 8.0*   < > 6.9*   < > 8.6* 8.8*   < >  --    < >  --    < > 7.5* 7.5* 7.0* 7.7* 8.4*  MCV 87.0   < > 89.6  --  88.8 87.5   < >  --    < >  --    < > 91.4 90.3 92.5 88.5 88.5  VITAMINB12 725  --   --   --   --   --   --   --   --   --   --   --   --   --   --   --   FOLATE 18.6  --   --   --   --   --   --   --   --   --   --   --   --   --   --   --   FERRITIN 618*  349*   < > 605*  --  584* 575*  --  593*  --   --   --   --  676*  --   --   --   TIBC 104*  --   --   --   --   --   --  112*  --  120  --   --  134*  --   --   --   IRON 53  --   --   --   --   --   --  40  --  38  --   --  63  --   --   --   RETICCTPCT 1.5  --   --   --   --   --   --   --   --   --   --   --   --   --   --   --    < > = values in this interval not displayed.     Cardiac Enzymes: No results for input(s): "CKTOTAL", "CKMB", "CKMBINDEX", "TROPONINI" in the last 168 hours. CBG: Recent Labs  Lab 06/11/22 0616  GLUCAP 80     Iron Studies: No results for input(s): "IRON", "TIBC", "TRANSFERRIN", "FERRITIN" in the last 72 hours.  Studies/Results: No results found.  Medications: Infusions:  sodium chloride Stopped (06/14/22 0013)   cefTRIAXone (ROCEPHIN)  IV 2 g (06/14/22 1101)   doxycycline (VIBRAMYCIN) IV Stopped (06/14/22 0004)    Scheduled Medications:  sodium chloride   Intravenous Once   carvedilol  6.25 mg Oral BID WC   Chlorhexidine Gluconate Cloth  6 each Topical Q0600    ferric citrate  630 mg Oral TID WC   heparin  5,000 Units Subcutaneous Q8H   hydrALAZINE  50 mg Oral Q8H   mupirocin ointment  1 Application Nasal BID   potassium chloride  20 mEq Oral Daily   sodium chloride flush  3 mL Intravenous Q12H   torsemide  50 mg Oral BID    have reviewed scheduled and prn medications.  Physical Exam: General:NAD, comfortable Heart:RRR, s1s2 nl Lungs: Clear bilateral, no wheeze or crackle Abdomen:soft, Non-tender, non-distended Extremities:  leg  edema +, looks chronic Neurology: Alert, awake, no asterixis  Jamayah Myszka Tanna Furry 06/14/2022,11:21 AM  LOS: 12 days

## 2022-06-14 NOTE — Care Management Important Message (Signed)
Important Message  Patient Details  Name: Kara Mullins MRN: [id number] Date of Birth: 1951-09-25   Medicare Important Message Given:  Yes     Shelda Altes 06/14/2022, 10:50 AM

## 2022-06-15 DIAGNOSIS — I5033 Acute on chronic diastolic (congestive) heart failure: Secondary | ICD-10-CM | POA: Diagnosis not present

## 2022-06-15 DIAGNOSIS — N179 Acute kidney failure, unspecified: Secondary | ICD-10-CM | POA: Diagnosis not present

## 2022-06-15 DIAGNOSIS — N049 Nephrotic syndrome with unspecified morphologic changes: Secondary | ICD-10-CM | POA: Diagnosis not present

## 2022-06-15 DIAGNOSIS — R0603 Acute respiratory distress: Secondary | ICD-10-CM | POA: Diagnosis not present

## 2022-06-15 LAB — CULTURE, BLOOD (ROUTINE X 2)
Culture: NO GROWTH
Culture: NO GROWTH
Special Requests: ADEQUATE

## 2022-06-15 MED ORDER — RENA-VITE PO TABS
1.0000 | ORAL_TABLET | Freq: Every day | ORAL | Status: DC
  Administered 2022-06-15 – 2022-06-16 (×2): 1 via ORAL
  Filled 2022-06-15 (×2): qty 1

## 2022-06-15 MED ORDER — ENSURE ENLIVE PO LIQD
237.0000 mL | Freq: Two times a day (BID) | ORAL | Status: DC
  Administered 2022-06-17 (×2): 237 mL via ORAL

## 2022-06-15 NOTE — Progress Notes (Signed)
PROGRESS NOTE  Lilliona Blakeney XHB:[id number] DOB: 06/28/1951 DOA: 06/02/2022 PCP: [name], NP  HPI/Recap of past 86 hours:  71 year old with history of HTN, HLD, CHF with preserved EF, paroxysmal A-fib, CKD stage IV admitted for worsening dyspnea on exertion over the past 2 days.  Follows with outpatient cardiology and nephrology.  Upon admission she was noted to have hypertensive urgency and signs of CHF exacerbation complicated by AKI on CKD stage IV.  Started on high-dose IV diuretics Lasix. She was switched to torsemide.  06/13/2022: The patient was seen and examined at bedside.  She is on antibiotics for, Rocephin and doxycycline.  On torsemide for acute CHF.  She feels her symptoms are improving however still feels weak.  She was assessed by PT OT with recommendation for home health PT OT.  06/14/2022: Patient seen.  No new complaints.  Patient continues to refuse renal replacement therapy.  Significantly elevated phosphorus level.  PTH is 125.  Last vitamin D level was 7.6.  We will start patient on cholecalciferol.  Hemoglobin is 8.4 g/dL today.  Nephrology input is appreciated.  Patient is on high-dose torsemide (50 Mg p.o. twice daily).  Low threshold to consult the palliative care team.  9223: Patient seen.  Patient continues to improve.  We will repeat renal panel in the morning.  We will consult physical therapy and transition of care team.  Hopefully, patient will be discharged in the next 1 to 2 days.  Assessment/Plan: Principal Problem:   (HFpEF) heart failure with preserved ejection fraction (HCC) Active Problems:   Acute respiratory distress   Elevated troponin level not due myocardial infarction   Acute kidney injury superimposed on chronic kidney disease ([place])   Hypertensive urgency   Anasarca associated with disorder of kidney   Bilateral lower extremity edema   Paroxysmal atrial fibrillation (HCC)   Anemia due to chronic kidney disease   Hyperlipidemia    Debility   Hypocalcemia   Subclinical hypothyroidism   Transaminitis  Resolved acute respiratory distress Acute congestive heart failure with preserved ejection fraction, 60%.  Class IV Anasarca/left sided effusion. -Echo in January 2023 showed EF 55% with grade 2 DD.  - Echocardiogram- ef 55%, g1DD, large left effusion.  - Cont fluid restriction.  Monitor input and output -PhosLo held per Nephrology -Pt noted to be very slow to diurese, Nephrology offered starting HD. Pt wishes to continue diuresis. -IV lasix was changed to torsemide given concerns of rising BUN and Cr, continue. 06/15/2022: Continue torsemide.  Patient continues to refuse renal replacement therapy.   Community-acquired pneumonia, POA Continue Rocephin and doxycycline MRSA screening test positive 06/14/2022: Rocephin has been changed to Augmentin.   Anemia, likely from chronic disease - Hemoglobin at 7.  Agrees to get 1 more unit of blood which was ordered.  Received another unit prior during this current hospitalization Hemoglobin 7.7 on 06/13/2022. 06/14/2022: Patient has anemia of chronic kidney disease versus multifactorial etiology.  Will defer need for iron and ESA to the nephrology team.  Hemoglobin is not at goal.    Elevated troponin Flat, chest pain-free.  Demand ischemia.   Acute kidney injury superimposed on chronic kidney disease stage IV -Baseline creatinine 3, admission creatinine 4.  Recently Foley catheter change.  Getting high-dose diuretics with albumin.  Phosphorus is up, will defer to nephro to manage this.  Unfortunately it appears patient is heading towards dialysis - discontinued losartan 06/14/2022: Suspect this may be a case of progressive chronic kidney disease, known CKD  5/ESRD.   Essential hypertension, uncontrolled - On hydralazine 50 mg 3 times daily and Coreg -Now on torsemide -Normal sinus rhythm, not on anticoagulation 06/14/2022: Blood pressures controlled.    Hypocalcemia Stable Corrected Ca 9.18   Subclinical hypothyroidism TSH slightly elevated but T4 normal.   Transaminitis Suspect from hepatic congestion.  Will monitor   Hypoalbuminemia Chronic malnutrition.  Encourage PO as tolerated 06/14/2022: Low threshold to consult podiatry team, considering CKD 5/possible ESRD.   Debility Due to worsening swelling and pulmonary edema -Therapy recs for HHPT/OT at time of d/c   Hypothermia Another episode of hypothermia, when vitals were checked for blood transfusion.  Similar episode happened couple of days ago.  Concern of UTI and she was started on ceftriaxone and doxycycline. Foley catheter was exchanged and cultures were sent. Blood cultures at that time remain negative. Patient improved with bear hugger. -Patient was given another bear hugger -Will repeat blood culture -Continue with antibiotics and follow-up cultures   Code Status: Full code  Family Communication: Updated her husband at bedside.  Disposition Plan: Anticipate discharge home 06/14/2022, home with home health services.   Consultants: Nephrology.  Procedures: None.  Antimicrobials: Rocephin, IV doxycycline  DVT prophylaxis: Subcu heparin 3 times daily  Status is: Inpatient The patient requires at least 2 midnights for further evaluation and treatment of present condition.    Objective: Vitals:   06/14/22 1929 06/15/22 0409 06/15/22 0805 06/15/22 1212  BP: (!) 134/95 130/66 (!) 152/70 (!) 142/66  Pulse:  63 66 66  Resp: 15 12 18 18   Temp: (!) 97.5 F (36.4 C) (!) 97.5 F (36.4 C) 98 F (36.7 C)   TempSrc: Oral Oral Oral   SpO2: 99% 98% 98% 98%  Weight:  93 kg    Height:        Intake/Output Summary (Last 24 hours) at 06/15/2022 1323 Last data filed at 06/15/2022 1216 Gross per 24 hour  Intake 150 ml  Output 1825 ml  Net -1675 ml    Filed Weights   06/13/22 0355 06/14/22 0507 06/15/22 0409  Weight: 95.5 kg 96.6 kg 93 kg     Exam:  General: Awake and alert.  Not in any distress.  Legs are wrapped.   HEENT: Patient is pale. CVS: S1-S2. Abdomen: Soft and nontender. Neuro: Awake and alert.  Moves all extremities. Lungs: Clear to auscultation. Extremities: Both lower legs are wrapped with Ace bandage.     Data Reviewed: CBC: Recent Labs  Lab 06/10/22 0747 06/11/22 0105 06/12/22 0644 06/13/22 0326 06/14/22 0631  WBC 3.1* 3.1* 3.6* 4.3 4.1  HGB 7.5* 7.5* 7.0* 7.7* 8.4*  HCT 23.5* 22.3* 22.2* 23.2* 25.4*  MCV 91.4 90.3 92.5 88.5 88.5  PLT 138* 130* 142* 147* 725    Basic Metabolic Panel: Recent Labs  Lab 06/09/22 0352 06/10/22 0747 06/11/22 0105 06/11/22 0114 06/12/22 0644 06/13/22 0326 06/14/22 0631  NA 137 139 136  --  137 139 140  K 3.4* 3.6 3.3*  --  3.4* 3.5 3.5  CL 107 108 105  --  107 107 105  CO2 22 22 22   --  24 22 22   GLUCOSE 81 77 92  --  82 73 82  BUN 93* 94* 97*  --  99* 95* 93*  CREATININE 4.42* 4.68* 4.82*  --  5.00* 4.93* 4.99*  CALCIUM 7.7* 7.7* 7.5*  --  7.6* 7.8* 8.1*  MG 2.5* 2.7*  --   --   --   --  2.4  PHOS 8.0*  --   --  8.6* 8.7* 8.1* 8.4*    GFR: Estimated Creatinine Clearance: 12 mL/min (A) (by C-G formula based on SCr of 4.99 mg/dL (H)). Liver Function Tests: Recent Labs  Lab 06/10/22 0747 06/11/22 0105 06/12/22 0644 06/13/22 0326 06/14/22 0631  AST 128* 141*  --   --   --   ALT 94* 106*  --   --   --   ALKPHOS 114 109  --   --   --   BILITOT 0.4 <0.1*  --   --   --   PROT 5.5* 5.1*  --   --   --   ALBUMIN 2.0* 1.9* 1.9* 2.5* 2.2*    No results for input(s): "LIPASE", "AMYLASE" in the last 168 hours. No results for input(s): "AMMONIA" in the last 168 hours. Coagulation Profile: No results for input(s): "INR", "PROTIME" in the last 168 hours. Cardiac Enzymes: No results for input(s): "CKTOTAL", "CKMB", "CKMBINDEX", "TROPONINI" in the last 168 hours. BNP (last 3 results) No results for input(s): "PROBNP" in the last 8760 hours. HbA1C: No  results for input(s): "HGBA1C" in the last 72 hours. CBG: Recent Labs  Lab 06/11/22 0616  GLUCAP 80    Lipid Profile: No results for input(s): "CHOL", "HDL", "LDLCALC", "TRIG", "CHOLHDL", "LDLDIRECT" in the last 72 hours. Thyroid Function Tests: No results for input(s): "TSH", "T4TOTAL", "FREET4", "T3FREE", "THYROIDAB" in the last 72 hours. Anemia Panel: No results for input(s): "VITAMINB12", "FOLATE", "FERRITIN", "TIBC", "IRON", "RETICCTPCT" in the last 72 hours.  Urine analysis:    Component Value Date/Time   COLORURINE YELLOW 06/12/2022 0736   APPEARANCEUR CLEAR 06/12/2022 0736   LABSPEC 1.009 06/12/2022 0736   PHURINE 5.0 06/12/2022 0736   GLUCOSEU 50 (A) 06/12/2022 0736   HGBUR NEGATIVE 06/12/2022 0736   BILIRUBINUR NEGATIVE 06/12/2022 0736   KETONESUR NEGATIVE 06/12/2022 0736   PROTEINUR >=300 (A) 06/12/2022 0736   UROBILINOGEN 0.2 11/19/2019 2004   NITRITE NEGATIVE 06/12/2022 0736   LEUKOCYTESUR NEGATIVE 06/12/2022 0736   Sepsis Labs: @LABRCNTIP (procalcitonin:4,lacticidven:4)  ) Recent Results (from the past 240 hour(s))  Culture, blood (Routine X 2) w Reflex to ID Panel     Status: None   Collection Time: 06/10/22 10:54 PM   Specimen: BLOOD RIGHT HAND  Result Value Ref Range Status   Specimen Description BLOOD RIGHT HAND  Final   Special Requests   Final    BOTTLES DRAWN AEROBIC AND ANAEROBIC Blood Culture results may not be optimal due to an inadequate volume of blood received in culture bottles   Culture   Final    NO GROWTH 5 DAYS Performed at [facility], [place] [address], [place], [place] [address]    Report Status 06/15/2022 FINAL  Final  Culture, blood (Routine X 2) w Reflex to ID Panel     Status: None   Collection Time: 06/10/22 10:54 PM   Specimen: BLOOD  Result Value Ref Range Status   Specimen Description BLOOD LEFT ANTECUBITAL  Final   Special Requests   Final    BOTTLES DRAWN AEROBIC ONLY Blood Culture adequate volume   Culture   Final     NO GROWTH 5 DAYS Performed at [facility], [place] [address], [place], [place] [address]    Report Status 06/15/2022 FINAL  Final  Remove urinary catheter to obtain Clean Catch urine culture     Status: Abnormal   Collection Time: 06/11/22  2:38 PM   Specimen: Urine, Catheterized  Result Value Ref Range Status   Specimen Description URINE, CATHETERIZED  Final   Special Requests   Final    NONE Performed at [facility], [address] [place], [place] [address]    Culture 50,000 COLONIES/mL ENTEROCOCCUS FAECALIS (A)  Final   Report Status 06/14/2022 FINAL  Final   Organism ID, Bacteria ENTEROCOCCUS FAECALIS (A)  Final      Susceptibility   Enterococcus faecalis - MIC*    AMPICILLIN <=2 SENSITIVE Sensitive     NITROFURANTOIN <=16 SENSITIVE Sensitive     VANCOMYCIN 1 SENSITIVE Sensitive     * 50,000 COLONIES/mL ENTEROCOCCUS FAECALIS  MRSA Next Gen by PCR, Nasal     Status: Abnormal   Collection Time: 06/12/22  5:42 AM   Specimen: Nasal Mucosa; Nasal Swab  Result Value Ref Range Status   MRSA by PCR Next Gen DETECTED (A) NOT DETECTED Final    Comment: RESULTS CALLED TO READ BACK BY AND VERIFIED WITH RN [email or web address] @0723  ON 06/12/22 BY NM (NOTE) The GeneXpert MRSA Assay (FDA approved for NASAL specimens only), is one component of a comprehensive MRSA colonization surveillance program. It is not intended to diagnose MRSA infection nor to guide or monitor treatment for MRSA infections. Test performance is not FDA approved in patients less than 70 years old. Performed at [facility], [place] [address], [place], [place] [address]   Culture, blood (Routine X 2) w Reflex to ID Panel     Status: None (Preliminary result)   Collection Time: 06/12/22  5:06 PM   Specimen: BLOOD  Result Value Ref Range Status   Specimen Description BLOOD RIGHT ANTECUBITAL  Final   Special Requests   Final    BOTTLES DRAWN AEROBIC AND ANAEROBIC Blood Culture adequate volume   Culture   Final     NO GROWTH 3 DAYS Performed at [facility], [place] [address] [place], [place] [address]    Report Status PENDING  Incomplete  Culture, blood (Routine X 2) w Reflex to ID Panel     Status: None (Preliminary result)   Collection Time: 06/12/22  5:12 PM   Specimen: BLOOD LEFT HAND  Result Value Ref Range Status   Specimen Description BLOOD LEFT HAND  Final   Special Requests   Final    BOTTLES DRAWN AEROBIC AND ANAEROBIC Blood Culture adequate volume   Culture   Final    NO GROWTH 3 DAYS Performed at [facility], [place] [address] [place], [place] [address]    Report Status PENDING  Incomplete      Studies: No results found.  Scheduled Meds:  sodium chloride   Intravenous Once   amoxicillin-clavulanate  1 tablet Oral Q12H   carvedilol  6.25 mg Oral BID WC   Chlorhexidine Gluconate Cloth  6 each Topical Q0600   cholecalciferol  2,000 Units Oral Daily   heparin  5,000 Units Subcutaneous Q8H   hydrALAZINE  50 mg Oral Q8H   mupirocin ointment  1 Application Nasal BID   potassium chloride  20 mEq Oral Daily   sevelamer carbonate  2.4 g Oral TID WC   sodium chloride flush  3 mL Intravenous Q12H   torsemide  50 mg Oral BID    Continuous Infusions:  sodium chloride Stopped (06/14/22 0013)     LOS: 13 days     Bonnell Public, MD [facility]   If 7PM-7AM, please contact night-coverage [email or web address] Password [email or web address] 06/15/2022, 1:23 PM

## 2022-06-15 NOTE — Progress Notes (Signed)
Physical Therapy Treatment Patient Details Name: Kara Mullins MRN: [id number] DOB: [birth year] Today's Date: 06/15/2022   History of Present Illness Pt is 71 yo female who [facility] to [facility]22 with progressive SOB. Pt with hypertensive urgency and CHF exacerbation complicated by AKI on CKD. Pt possibly heading toward HD. PMH: HTN, HLD, HF, Afib, CKD IV    PT Comments    New order received for final discharge recommendations for pt. Met with patient and daughter (by phone). Daughter has been unable to find a used stedy (continues to look for one) and both parties are interested in a hoyer lift for safely getting her OOB to wheelchair. They also would like a drop-arm BSC to increase pt's ability to get to [facility] with lateral scoot transfer. (And until she can do safely, they could use hoyer to lift her to [facility]). Finally, we discussed transport home and pt/daughter feel strongly that son-in-law and another female relative can pivot pt in/out of car from wheelchair. They do not want to use medical transport. Will notify Case Manager/TOC team regarding above.     Recommendations for follow up therapy are one component of a multi-disciplinary discharge planning process, led by the attending physician.  Recommendations may be updated based on patient status, additional functional criteria and insurance authorization.  Follow Up Recommendations  Home health PT     Assistance Recommended at Discharge Frequent or constant Supervision/Assistance  Patient can return home with the following Two people to help with walking and/or transfers;A lot of help with bathing/dressing/bathroom;Assist for transportation;Help with stairs or ramp for entrance;Assistance with cooking/housework   Equipment Recommendations  Other (comment) (drop-arm BSC; hoyer lift (both pt and daughter agree))    Recommendations for Other Services       Precautions / Restrictions Precautions Precautions:  Fall Restrictions Weight Bearing Restrictions: No     Mobility  Bed Mobility                    Transfers                   General transfer comment: Discussed options for discharge home: daughter transport vs medical transport and pt/daughter want to transport patient.    Ambulation/Gait                   Stairs             Wheelchair Mobility    Modified Rankin (Stroke Patients Only)       Balance                                            Cognition Arousal/Alertness: Awake/alert Behavior During Therapy: WFL for tasks assessed/performed Overall Cognitive Status: Within Functional Limits for tasks assessed                                 General Comments: motivated towards increasing mobility        Exercises      General Comments        Pertinent Vitals/Pain Pain Assessment Pain Assessment: No/denies pain    Home Living                          Prior Function  PT Goals (current goals can now be found in the care plan section) Acute Rehab PT Goals Patient Stated Goal: to walk PT Goal Formulation: With patient Time For Goal Achievement: 06/17/22 Potential to Achieve Goals: Good Progress towards PT goals: Progressing toward goals    Frequency    Min 3X/week      PT Plan Current plan remains appropriate    Co-evaluation              AM-PAC PT "6 Clicks" Mobility   Outcome Measure  Help needed turning from your back to your side while in a flat bed without using bedrails?: A Lot Help needed moving from lying on your back to sitting on the side of a flat bed without using bedrails?: A Lot Help needed moving to and from a bed to a chair (including a wheelchair)?: Total Help needed standing up from a chair using your arms (e.g., wheelchair or bedside chair)?: Total Help needed to walk in hospital room?: Total Help needed climbing 3-5 steps with a  railing? : Total 6 Click Score: 8    End of Session     Patient left: with call bell/phone within reach;in bed Nurse Communication: Mobility status;Other (comment) (discharge recommendations) PT Visit Diagnosis: Muscle weakness (generalized) (M62.81);Difficulty in walking, not elsewhere classified (R26.2)     Time: 9672-2773 PT Time Calculation (min) (ACUTE ONLY): 22 min  Charges:  $Self Care/Home Management: [place], [place]  Office [phone]    Rexanne Mano 06/15/2022, 2:59 PM

## 2022-06-15 NOTE — TOC Transition Note (Signed)
Transition of Care [facility]) - CM/SW Discharge Note   Patient Details  Name: Gaylene Moylan MRN: [id number] Date of Birth: 1951/08/18  [facility] CM/SW Contact:  Carles Collet, RN Phone Number: 06/15/2022, 3:26 PM   Clinical Narrative:     Damaris Schooner w patient's daughter and she would like hoyer and drop arm BSC  to be delivered to the home. This was ordered through [place]. Notified [facility] of potential DC.   Final next level of care: Home w Home Health Services Barriers to Discharge: No Barriers Identified   Patient Goals and CMS Choice Patient states their goals for this hospitalization and ongoing recovery are:: return home      Discharge Placement                       Discharge Plan and Services In-house Referral: NA Discharge Planning Services: CM Consult Post Acute Care Choice: Home Health          DME Arranged: Bedside commode (drop arm BSC and hoyer) DME Agency: [employer] Date DME Agency Contacted: 06/15/22 Time DME Agency Contacted: [id number] Representative spoke with at DME Agency: [place]: RN, PT, OT [facility] Agency: [facility] ([place]) Date [place]: 06/15/22 Time [place]: 1526 Representative spoke with at [place]: Corene Cornea  Social Determinants of Health (SDOH) Interventions Food Insecurity Interventions: Intervention Not Indicated Housing Interventions: Intervention Not Indicated Transportation Interventions: Intervention Not Indicated Financial Strain Interventions: Intervention Not Indicated   Readmission Risk Interventions    06/05/2022    4:41 PM 12/23/2021   11:01 AM  Readmission Risk Prevention Plan  Transportation Screening Complete Complete  PCP or Specialist Appt within 3-5 Days  Complete  HRI or [place]  Complete  Social Work Consult for [place] Planning/Counseling  Complete  Palliative Care Screening  Not Applicable  Medication Review Press photographer) Complete Complete   HRI or [place] Complete   SW Recovery Care/Counseling Consult Complete   [place] Not Applicable

## 2022-06-15 NOTE — Progress Notes (Signed)
Initial Nutrition Assessment  DOCUMENTATION CODES:   Not applicable  INTERVENTION:   Renal Multivitamin w/ minerals daily Ensure Enlive po BID, each supplement provides 350 kcal and 20 grams of protein. Encourage good PO intake   NUTRITION DIAGNOSIS:   Increased nutrient needs related to chronic illness (CHF, CKD) as evidenced by estimated needs.  GOAL:   Patient will meet greater than or equal to 90% of their needs  MONITOR:   PO intake, Labs, Supplement acceptance, Weight trends  REASON FOR ASSESSMENT:   Consult Assessment of nutrition requirement/status, Poor PO  ASSESSMENT:   71 y.o. female presented to the ED with increased SOB and increased BLE swelling. PMH includes CHF, CKD IV, HLD, HTN, and malnutrition. Pt admitted with AKI on CKD, respiratory distress secondary to CHF exacerbation.   RD working remotely at time of assessment; unable to reach pt on phone at this time.  Pt PO intake has decreased over the past few days.  Meal completions: 0-100% (average 65%) Per EMR, pt weight has increased over the past year.   Medications reviewed and include: Augmentin, Vitamin D3, Potassium Chloride, Renvela, Torsemide Labs reviewed: Phosphorus 8.4   UOP: 1575 mL x 24 hours  NUTRITION - FOCUSED PHYSICAL EXAM:  Deferred to follow-up.   Diet Order:   Diet Order             Diet renal with fluid restriction Fluid restriction: 1200 mL Fluid; Room service appropriate? Yes; Fluid consistency: Thin  Diet effective now                   EDUCATION NEEDS:   No education needs have been identified at this time  Skin:  Skin Assessment: Reviewed RN Assessment  Last BM:  8/30  Height:   Ht Readings from Last 1 Encounters:  06/02/22 5\' 5"  (1.651 m)    Weight:   Wt Readings from Last 1 Encounters:  06/15/22 93 kg    Ideal Body Weight:  56.8 kg  BMI:  Body mass index is 34.12 kg/m.  Estimated Nutritional Needs:   Kcal:  2000-2200  Protein:  100-115  grams  Fluid:  >/= 2 L    Hermina Barters RD, LDN Clinical Dietitian See [facility] for contact information.

## 2022-06-16 DIAGNOSIS — R0603 Acute respiratory distress: Secondary | ICD-10-CM | POA: Diagnosis not present

## 2022-06-16 DIAGNOSIS — I5033 Acute on chronic diastolic (congestive) heart failure: Secondary | ICD-10-CM | POA: Diagnosis not present

## 2022-06-16 DIAGNOSIS — N049 Nephrotic syndrome with unspecified morphologic changes: Secondary | ICD-10-CM | POA: Diagnosis not present

## 2022-06-16 DIAGNOSIS — N179 Acute kidney failure, unspecified: Secondary | ICD-10-CM | POA: Diagnosis not present

## 2022-06-16 NOTE — Progress Notes (Signed)
PROGRESS NOTE  Kara Mullins CHY:[id number] DOB: June 15, 1951 DOA: 06/02/2022 PCP: [name], NP  HPI/Recap of past 86 hours:  71 year old with history of HTN, HLD, CHF with preserved EF, paroxysmal A-fib, CKD stage IV admitted for worsening dyspnea on exertion over the past 2 days.  Follows with outpatient cardiology and nephrology.  Upon admission she was noted to have hypertensive urgency and signs of CHF exacerbation complicated by AKI on CKD stage IV.  Started on high-dose IV diuretics Lasix. She was switched to torsemide.  06/13/2022: The patient was seen and examined at bedside.  She is on antibiotics for, Rocephin and doxycycline.  On torsemide for acute CHF.  She feels her symptoms are improving however still feels weak.  She was assessed by PT OT with recommendation for home health PT OT.  06/14/2022: Patient seen.  No new complaints.  Patient continues to refuse renal replacement therapy.  Significantly elevated phosphorus level.  PTH is 125.  Last vitamin D level was 7.6.  We will start patient on cholecalciferol.  Hemoglobin is 8.4 g/dL today.  Nephrology input is appreciated.  Patient is on high-dose torsemide (50 Mg p.o. twice daily).  Low threshold to consult the palliative care team.  06/15/2022: Patient seen.  Patient continues to improve.  We will repeat renal panel in the morning.  We will consult physical therapy and transition of care team.  Hopefully, patient will be discharged in the next 1 to 2 days.  06/16/2022: Patient seen.  Patient is not keen on being discharged back home today.  According to the patient, her family have left town for the holiday.  Patient will be discharged tomorrow.  We will check renal panel and magnesium tomorrow.  Patient will follow-up with the nephrology team on discharge.  No new complaints today.  Assessment/Plan: Principal Problem:   (HFpEF) heart failure with preserved ejection fraction (HCC) Active Problems:   Acute respiratory  distress   Elevated troponin level not due myocardial infarction   Acute kidney injury superimposed on chronic kidney disease ([place])   Hypertensive urgency   Anasarca associated with disorder of kidney   Bilateral lower extremity edema   Paroxysmal atrial fibrillation (HCC)   Anemia due to chronic kidney disease   Hyperlipidemia   Debility   Hypocalcemia   Subclinical hypothyroidism   Transaminitis  Resolved acute respiratory distress Acute congestive heart failure with preserved ejection fraction, 60%.  Class IV Anasarca/left sided effusion. -Echo in January 2023 showed EF 55% with grade 2 DD.  - Echocardiogram- ef 55%, g1DD, large left effusion.  - Cont fluid restriction.  Monitor input and output -PhosLo held per Nephrology -Pt noted to be very slow to diurese, Nephrology offered starting HD. Pt wishes to continue diuresis. -IV lasix was changed to torsemide given concerns of rising BUN and Cr, continue. 06/16/2022: Continue torsemide.  Patient continues to refuse renal replacement therapy.   Community-acquired pneumonia, POA Continue Rocephin and doxycycline MRSA screening test positive 06/14/2022: Rocephin has been changed to Augmentin. 06/16/2022: Complete course of antibiotics.   Anemia, likely from chronic disease - Hemoglobin at 7.  Agrees to get 1 more unit of blood which was ordered.  Received another unit prior during this current hospitalization Hemoglobin 7.7 on 06/13/2022. 06/14/2022: Patient has anemia of chronic kidney disease versus multifactorial etiology.  Will defer need for iron and ESA to the nephrology team.  Hemoglobin is not at goal.    Elevated troponin Flat, chest pain-free.  Demand ischemia.   Acute  kidney injury superimposed on chronic kidney disease stage IV -Baseline creatinine 3, admission creatinine 4.  Recently Foley catheter change.  Getting high-dose diuretics with albumin.  Phosphorus is up, will defer to nephro to manage this.  Unfortunately it  appears patient is heading towards dialysis - discontinued losartan 06/14/2022: Suspect this may be a case of progressive chronic kidney disease, known CKD 5/ESRD.   Essential hypertension, uncontrolled - On hydralazine 50 mg 3 times daily and Coreg -Now on torsemide -Normal sinus rhythm, not on anticoagulation 06/14/2022: Blood pressures controlled.   Hypocalcemia Stable Corrected Ca 9.18   Subclinical hypothyroidism TSH slightly elevated but T4 normal.   Transaminitis Suspect from hepatic congestion.  Will monitor   Hypoalbuminemia Chronic malnutrition.  Encourage PO as tolerated 06/14/2022: Low threshold to consult podiatry team, considering CKD 5/possible ESRD.   Debility Due to worsening swelling and pulmonary edema -Therapy recs for HHPT/OT at time of d/c   Hypothermia Another episode of hypothermia, when vitals were checked for blood transfusion.  Similar episode happened couple of days ago.  Concern of UTI and she was started on ceftriaxone and doxycycline. Foley catheter was exchanged and cultures were sent. Blood cultures at that time remain negative. Patient improved with bear hugger. -Patient was given another bear hugger -Will repeat blood culture -Continue with antibiotics and follow-up cultures   Code Status: Full code  Family Communication: Updated her husband at bedside.  Disposition Plan: Anticipate discharge home 06/14/2022, home with home health services.   Consultants: Nephrology.  Procedures: None.  Antimicrobials: Rocephin, IV doxycycline  DVT prophylaxis: Subcu heparin 3 times daily  Status is: Inpatient The patient requires at least 2 midnights for further evaluation and treatment of present condition.    Objective: Vitals:   06/15/22 1643 06/15/22 1959 06/16/22 0620 06/16/22 0731  BP:  120/63 (!) 148/69 (!) 114/98  Pulse: 63  62 (!) 56  Resp:  16 12 15   Temp:  98.3 F (36.8 C) 98.3 F (36.8 C) 98 F (36.7 C)  TempSrc:  Oral  Oral Oral  SpO2:   98% 99%  Weight:   96.3 kg   Height:        Intake/Output Summary (Last 24 hours) at 06/16/2022 1441 Last data filed at 06/16/2022 1421 Gross per 24 hour  Intake 360 ml  Output 2800 ml  Net -2440 ml    Filed Weights   06/14/22 0507 06/15/22 0409 06/16/22 0620  Weight: 96.6 kg 93 kg 96.3 kg    Exam:  General: Awake and alert.  Not in any distress.  Legs are wrapped.   HEENT: Patient is pale. CVS: S1-S2. Abdomen: Soft and nontender. Neuro: Awake and alert.  Moves all extremities. Lungs: Clear to auscultation. Extremities: Both lower legs are wrapped with Ace bandage.     Data Reviewed: CBC: Recent Labs  Lab 06/10/22 0747 06/11/22 0105 06/12/22 0644 06/13/22 0326 06/14/22 0631  WBC 3.1* 3.1* 3.6* 4.3 4.1  HGB 7.5* 7.5* 7.0* 7.7* 8.4*  HCT 23.5* 22.3* 22.2* 23.2* 25.4*  MCV 91.4 90.3 92.5 88.5 88.5  PLT 138* 130* 142* 147* 737    Basic Metabolic Panel: Recent Labs  Lab 06/10/22 0747 06/11/22 0105 06/11/22 0114 06/12/22 0644 06/13/22 0326 06/14/22 0631  NA 139 136  --  137 139 140  K 3.6 3.3*  --  3.4* 3.5 3.5  CL 108 105  --  107 107 105  CO2 22 22  --  24 22 22   GLUCOSE 77 92  --  82 73 82  BUN 94* 97*  --  99* 95* 93*  CREATININE 4.68* 4.82*  --  5.00* 4.93* 4.99*  CALCIUM 7.7* 7.5*  --  7.6* 7.8* 8.1*  MG 2.7*  --   --   --   --  2.4  PHOS  --   --  8.6* 8.7* 8.1* 8.4*    GFR: Estimated Creatinine Clearance: 12.2 mL/min (A) (by C-G formula based on SCr of 4.99 mg/dL (H)). Liver Function Tests: Recent Labs  Lab 06/10/22 0747 06/11/22 0105 06/12/22 0644 06/13/22 0326 06/14/22 0631  AST 128* 141*  --   --   --   ALT 94* 106*  --   --   --   ALKPHOS 114 109  --   --   --   BILITOT 0.4 <0.1*  --   --   --   PROT 5.5* 5.1*  --   --   --   ALBUMIN 2.0* 1.9* 1.9* 2.5* 2.2*    No results for input(s): "LIPASE", "AMYLASE" in the last 168 hours. No results for input(s): "AMMONIA" in the last 168 hours. Coagulation Profile: No  results for input(s): "INR", "PROTIME" in the last 168 hours. Cardiac Enzymes: No results for input(s): "CKTOTAL", "CKMB", "CKMBINDEX", "TROPONINI" in the last 168 hours. BNP (last 3 results) No results for input(s): "PROBNP" in the last 8760 hours. HbA1C: No results for input(s): "HGBA1C" in the last 72 hours. CBG: Recent Labs  Lab 06/11/22 0616  GLUCAP 80    Lipid Profile: No results for input(s): "CHOL", "HDL", "LDLCALC", "TRIG", "CHOLHDL", "LDLDIRECT" in the last 72 hours. Thyroid Function Tests: No results for input(s): "TSH", "T4TOTAL", "FREET4", "T3FREE", "THYROIDAB" in the last 72 hours. Anemia Panel: No results for input(s): "VITAMINB12", "FOLATE", "FERRITIN", "TIBC", "IRON", "RETICCTPCT" in the last 72 hours.  Urine analysis:    Component Value Date/Time   COLORURINE YELLOW 06/12/2022 0736   APPEARANCEUR CLEAR 06/12/2022 0736   LABSPEC 1.009 06/12/2022 0736   PHURINE 5.0 06/12/2022 0736   GLUCOSEU 50 (A) 06/12/2022 0736   HGBUR NEGATIVE 06/12/2022 0736   BILIRUBINUR NEGATIVE 06/12/2022 0736   KETONESUR NEGATIVE 06/12/2022 0736   PROTEINUR >=300 (A) 06/12/2022 0736   UROBILINOGEN 0.2 11/19/2019 2004   NITRITE NEGATIVE 06/12/2022 0736   LEUKOCYTESUR NEGATIVE 06/12/2022 0736   Sepsis Labs: @LABRCNTIP (procalcitonin:4,lacticidven:4)  ) Recent Results (from the past 240 hour(s))  Culture, blood (Routine X 2) w Reflex to ID Panel     Status: None   Collection Time: 06/10/22 10:54 PM   Specimen: BLOOD RIGHT HAND  Result Value Ref Range Status   Specimen Description BLOOD RIGHT HAND  Final   Special Requests   Final    BOTTLES DRAWN AEROBIC AND ANAEROBIC Blood Culture results may not be optimal due to an inadequate volume of blood received in culture bottles   Culture   Final    NO GROWTH 5 DAYS Performed at [facility], [place] [address], [place], [place] [address]    Report Status 06/15/2022 FINAL  Final  Culture, blood (Routine X 2) w Reflex to ID Panel      Status: None   Collection Time: 06/10/22 10:54 PM   Specimen: BLOOD  Result Value Ref Range Status   Specimen Description BLOOD LEFT ANTECUBITAL  Final   Special Requests   Final    BOTTLES DRAWN AEROBIC ONLY Blood Culture adequate volume   Culture   Final    NO GROWTH 5 DAYS Performed at [facility], [place] [address] [place], [place] [address]    Report Status 06/15/2022 FINAL  Final  Remove urinary catheter to obtain Clean Catch urine culture     Status: Abnormal   Collection Time: 06/11/22  2:38 PM   Specimen: Urine, Catheterized  Result Value Ref Range Status   Specimen Description URINE, CATHETERIZED  Final   Special Requests   Final    NONE Performed at [facility], [place] [address] [place], [place] [address]    Culture 50,000 COLONIES/mL ENTEROCOCCUS FAECALIS (A)  Final   Report Status 06/14/2022 FINAL  Final   Organism ID, Bacteria ENTEROCOCCUS FAECALIS (A)  Final      Susceptibility   Enterococcus faecalis - MIC*    AMPICILLIN <=2 SENSITIVE Sensitive     NITROFURANTOIN <=16 SENSITIVE Sensitive     VANCOMYCIN 1 SENSITIVE Sensitive     * 50,000 COLONIES/mL ENTEROCOCCUS FAECALIS  MRSA Next Gen by PCR, Nasal     Status: Abnormal   Collection Time: 06/12/22  5:42 AM   Specimen: Nasal Mucosa; Nasal Swab  Result Value Ref Range Status   MRSA by PCR Next Gen DETECTED (A) NOT DETECTED Final    Comment: RESULTS CALLED TO READ BACK BY AND VERIFIED WITH RN [email or web address] @0723  ON 06/12/22 BY NM (NOTE) The GeneXpert MRSA Assay (FDA approved for NASAL specimens only), is one component of a comprehensive MRSA colonization surveillance program. It is not intended to diagnose MRSA infection nor to guide or monitor treatment for MRSA infections. Test performance is not FDA approved in patients less than 53 years old. Performed at [facility], [place] [address] [place], [place] [address]   Culture, blood (Routine X 2) w Reflex to ID Panel     Status: None (Preliminary  result)   Collection Time: 06/12/22  5:06 PM   Specimen: BLOOD  Result Value Ref Range Status   Specimen Description BLOOD RIGHT ANTECUBITAL  Final   Special Requests   Final    BOTTLES DRAWN AEROBIC AND ANAEROBIC Blood Culture adequate volume   Culture   Final    NO GROWTH 4 DAYS Performed at [facility], [place] [address], [place], [place] [address]    Report Status PENDING  Incomplete  Culture, blood (Routine X 2) w Reflex to ID Panel     Status: None (Preliminary result)   Collection Time: 06/12/22  5:12 PM   Specimen: BLOOD LEFT HAND  Result Value Ref Range Status   Specimen Description BLOOD LEFT HAND  Final   Special Requests   Final    BOTTLES DRAWN AEROBIC AND ANAEROBIC Blood Culture adequate volume   Culture   Final    NO GROWTH 4 DAYS Performed at [facility], [place] [address] [place], [place] [address]    Report Status PENDING  Incomplete      Studies: No results found.  Scheduled Meds:  sodium chloride   Intravenous Once   amoxicillin-clavulanate  1 tablet Oral Q12H   carvedilol  6.25 mg Oral BID WC   Chlorhexidine Gluconate Cloth  6 each Topical Q0600   cholecalciferol  2,000 Units Oral Daily   feeding supplement  237 mL Oral BID BM   heparin  5,000 Units Subcutaneous Q8H   hydrALAZINE  50 mg Oral Q8H   multivitamin  1 tablet Oral QHS   mupirocin ointment  1 Application Nasal BID   potassium chloride  20 mEq Oral Daily   sevelamer  carbonate  2.4 g Oral TID WC   sodium chloride flush  3 mL Intravenous Q12H   torsemide  50 mg Oral BID    Continuous Infusions:  sodium chloride Stopped (06/14/22 0013)     LOS: 14 days     Bonnell Public, MD [facility]   If 7PM-7AM, please contact night-coverage [email or web address] Password [email or web address] 06/16/2022, 2:41 PM

## 2022-06-17 DIAGNOSIS — I5033 Acute on chronic diastolic (congestive) heart failure: Secondary | ICD-10-CM | POA: Diagnosis not present

## 2022-06-17 DIAGNOSIS — N179 Acute kidney failure, unspecified: Secondary | ICD-10-CM

## 2022-06-17 DIAGNOSIS — R0603 Acute respiratory distress: Secondary | ICD-10-CM | POA: Diagnosis not present

## 2022-06-17 DIAGNOSIS — R600 Localized edema: Secondary | ICD-10-CM | POA: Diagnosis not present

## 2022-06-17 LAB — RENAL FUNCTION PANEL
Albumin: 1.8 g/dL — ABNORMAL LOW (ref 3.5–5.0)
Anion gap: 9 (ref 5–15)
BUN: 91 mg/dL — ABNORMAL HIGH (ref 8–23)
CO2: 23 mmol/L (ref 22–32)
Calcium: 7.9 mg/dL — ABNORMAL LOW (ref 8.9–10.3)
Chloride: 110 mmol/L (ref 98–111)
Creatinine, Ser: 4.93 mg/dL — ABNORMAL HIGH (ref 0.44–1.00)
GFR, Estimated: 9 mL/min — ABNORMAL LOW (ref >60)
Glucose, Bld: 82 mg/dL (ref 70–99)
Phosphorus: 8.4 mg/dL — ABNORMAL HIGH (ref 2.5–4.6)
Potassium: 3.7 mmol/L (ref 3.5–5.1)
Sodium: 142 mmol/L (ref 135–145)

## 2022-06-17 LAB — CULTURE, BLOOD (ROUTINE X 2)
Culture: NO GROWTH
Culture: NO GROWTH
Special Requests: ADEQUATE
Special Requests: ADEQUATE

## 2022-06-17 LAB — MAGNESIUM: Magnesium: 2.4 mg/dL (ref 1.7–2.4)

## 2022-06-17 MED ORDER — CARVEDILOL 6.25 MG PO TABS: 6.2500 mg | ORAL_TABLET | Freq: Two times a day (BID) | ORAL | 0 refills | Status: DC

## 2022-06-17 MED ORDER — ENSURE ENLIVE PO LIQD: 237.0000 mL | Freq: Two times a day (BID) | ORAL | 12 refills | Status: DC

## 2022-06-17 MED ORDER — AMOXICILLIN-POT CLAVULANATE 500-125 MG PO TABS: 1.0000 | ORAL_TABLET | Freq: Two times a day (BID) | ORAL | 0 refills | Status: AC

## 2022-06-17 MED ORDER — SENNOSIDES-DOCUSATE SODIUM 8.6-50 MG PO TABS: 1.0000 | ORAL_TABLET | Freq: Every evening | ORAL | 0 refills | Status: AC | PRN

## 2022-06-17 MED ORDER — TORSEMIDE 10 MG PO TABS: 50.0000 mg | ORAL_TABLET | Freq: Two times a day (BID) | ORAL | 0 refills | Status: DC

## 2022-06-17 MED ORDER — RENA-VITE PO TABS: 1.0000 | ORAL_TABLET | Freq: Every day | ORAL | 0 refills | Status: AC

## 2022-06-17 MED ORDER — FERROUS SULFATE 325 (65 FE) MG PO TABS
325.0000 mg | ORAL_TABLET | Freq: Every day | ORAL | 0 refills | Status: DC
Start: 2022-06-17 — End: 2022-07-24

## 2022-06-17 MED ORDER — HYDRALAZINE HCL 50 MG PO TABS: 50.0000 mg | ORAL_TABLET | Freq: Three times a day (TID) | ORAL | 0 refills | Status: DC

## 2022-06-17 MED ORDER — SEVELAMER CARBONATE 2.4 G PO PACK: 2.4000 g | PACK | Freq: Three times a day (TID) | ORAL | 0 refills | Status: AC

## 2022-06-17 NOTE — Progress Notes (Signed)
RN noticed that patient's HR was in the 57s. CCMD called to report HR while RN in room. Patient asymptomatic. MD notified. No new orders. MD stated to go ahead with discharge.

## 2022-06-17 NOTE — Progress Notes (Signed)
Physical Therapy Treatment Patient Details Name: Kara Mullins MRN: [id number] DOB: 19-Mar-1951 Today's Date: 06/17/2022   History of Present Illness Pt is 71 yo female who presented to [facility] on 06/02/22 with progressive SOB. Pt with hypertensive urgency and CHF exacerbation complicated by AKI on CKD. Pt possibly heading toward HD. PMH: HTN, HLD, HF, Afib, CKD IV    PT Comments    Pt continues to be very motivated to progress mobility with the goal of being able to walk again. Pt able to move LE's off bed and come to sitting with min HHA. Pt stood in stedy with min A for power up from bed. She worked on sit>stand from flaps of stedy with min-guard and was able to complete 3x in 30 secs. Pt also able to begin pregait actvities in standing including stepping in place and wt shifting. She was unable to lift feet in standing last session. Pt performed core exercises in sitting and HEP reviewed for d/c. PT will continue to follow.    Recommendations for follow up therapy are one component of a multi-disciplinary discharge planning process, led by the attending physician.  Recommendations may be updated based on patient status, additional functional criteria and insurance authorization.  Follow Up Recommendations  Home health PT     Assistance Recommended at Discharge Frequent or constant Supervision/Assistance  Patient can return home with the following Two people to help with walking and/or transfers;A lot of help with bathing/dressing/bathroom;Assist for transportation;Help with stairs or ramp for entrance;Assistance with cooking/housework   Equipment Recommendations  Other (comment) (drop-arm BSC; hoyer lift (both pt and daughter agree))    Recommendations for Other Services       Precautions / Restrictions Precautions Precautions: Fall Restrictions Weight Bearing Restrictions: No     Mobility  Bed Mobility Overal bed mobility: Needs Assistance Bed Mobility: Supine to Sit      Supine to sit: Min assist     General bed mobility comments: pt able to move BLE's off bed without assist today, min HHA for elevation of trunk into sitting. Pt able to scoot EOB in sitting without assist.    Transfers Overall transfer level: Needs assistance Equipment used: Ambulation equipment used Charlaine Dalton) Transfers: Sit to/from Stand Sit to Stand: Min assist           General transfer comment: pt stood with light min A to stedy. Worked on sit>stand for strengthening 3x in 30 secs. Also performed stepping exercises in standing. Pt could not step feet last week and was able to today Transfer via Lift Equipment: Stedy  Ambulation/Gait             Pre-gait activities: wt shifting then 2 bouts of stepping each foot 5x in standing. Pt still limited by sometimess needing to lean onto elbows which will be a limiting factor with RW but making big strides in this direction General Gait Details: progressing with pregait activities   Stairs             Wheelchair Mobility    Modified Rankin (Stroke Patients Only)       Balance Overall balance assessment: Needs assistance Sitting-balance support: Feet supported, No upper extremity supported Sitting balance-Leahy Scale: Good Sitting balance - Comments: left sitting on EOB at end of session   Standing balance support: During functional activity, Reliant on assistive device for balance, Bilateral upper extremity supported Standing balance-Leahy Scale: Poor Standing balance comment: reliant on Stedy for support  Cognition Arousal/Alertness: Awake/alert Behavior During Therapy: WFL for tasks assessed/performed Overall Cognitive Status: Within Functional Limits for tasks assessed                                 General Comments: motivated towards increasing mobility        Exercises General Exercises - Lower Extremity Long Arc Quad: AROM, Right, Left, 10 reps,  Seated, AAROM Hip Flexion/Marching: AROM, Right, Left, 10 reps, Standing Other Exercises Other Exercises: modified abdominal crunch in sitting x10 Other Exercises: oblique activation with side leaning in sitting 5x each side    General Comments General comments (skin integrity, edema, etc.): VSS      Pertinent Vitals/Pain Pain Assessment Pain Assessment: No/denies pain    Home Living                          Prior Function            PT Goals (current goals can now be found in the care plan section) Acute Rehab PT Goals Patient Stated Goal: to walk PT Goal Formulation: With patient Time For Goal Achievement: 06/17/22 Potential to Achieve Goals: Good Progress towards PT goals: Progressing toward goals    Frequency    Min 3X/week      PT Plan Current plan remains appropriate    Co-evaluation              AM-PAC PT "6 Clicks" Mobility   Outcome Measure  Help needed turning from your back to your side while in a flat bed without using bedrails?: A Little Help needed moving from lying on your back to sitting on the side of a flat bed without using bedrails?: A Little Help needed moving to and from a bed to a chair (including a wheelchair)?: Total Help needed standing up from a chair using your arms (e.g., wheelchair or bedside chair)?: Total Help needed to walk in hospital room?: Total Help needed climbing 3-5 steps with a railing? : Total 6 Click Score: 10    End of Session   Activity Tolerance: Patient tolerated treatment well Patient left: with call bell/phone within reach;in bed Nurse Communication: Mobility status PT Visit Diagnosis: Muscle weakness (generalized) (M62.81);Difficulty in walking, not elsewhere classified (R26.2)     Time: 4481-8563 PT Time Calculation (min) (ACUTE ONLY): 26 min  Charges:  $Gait Training: 8-22 mins $Therapeutic Exercise: 8-22 mins                     Leighton Roach, PT  Acute Rehab Services Secure  chat preferred Office [place] 06/17/2022, 11:56 AM

## 2022-06-17 NOTE — TOC Transition Note (Signed)
Transition of Care [facility]) - CM/SW Discharge Note   Patient Details  Name: Kara Mullins MRN: [id number] Date of Birth: 1950-11-29  Transition of Care [facility]) CM/SW Contact:  Cyndi Bender, RN Phone Number: 06/17/2022, 3:37 PM   Clinical Narrative:    Patient stable for discharge.  Daughter will transport home. [facility] notified of discharge. No other TOC needs at this time.   Final next level of care: [place] Barriers to Discharge: Barriers Resolved   Patient Goals and CMS Choice Patient states their goals for this hospitalization and ongoing recovery are:: return home      Discharge Placement               home        Discharge Plan and Services In-house Referral: NA Discharge Planning Services: CM Consult Post Acute Care Choice: Home Health          DME Arranged: Bedside commode (drop arm BSC and hoyer) DME Agency: [employer] Date DME Agency Contacted: 06/15/22 Time DME Agency Contacted: [id number] Representative spoke with at DME Agency: [place]: RN, PT, OT [facility] Agency: [place] ([place]) Date [place]: 06/15/22 Time [place]: [id number] Representative spoke with at [place]: [place] ([place]) Interventions Food Insecurity Interventions: Intervention Not Indicated Housing Interventions: Intervention Not Indicated Transportation Interventions: Intervention Not Indicated Financial Strain Interventions: Intervention Not Indicated   Readmission Risk Interventions    06/17/2022    3:36 PM 06/17/2022    3:34 PM 06/05/2022    4:41 PM  Readmission Risk Prevention Plan  Transportation Screening  Complete Complete  Medication Review Press photographer) Complete Complete Complete  PCP or Specialist appointment within 3-5 days of discharge Complete Complete   HRI or Home Care Consult Complete  Complete  SW Recovery Care/Counseling Consult Complete  Complete  Palliative Care  Screening Not Applicable  Not [place] Not Applicable  Not Applicable

## 2022-06-17 NOTE — Discharge Summary (Addendum)
Physician Discharge Summary  Patient ID: Kara Mullins MRN: [id number] DOB/AGE: 1951/05/14 71 y.o.  Admit date: 06/02/2022 Discharge date: 06/17/2022  Admission Diagnoses:  Discharge Diagnoses:  Principal Problem:   (HFpEF) heart failure with preserved ejection fraction ([place]) Active Problems: Pulmonary edema. Volume overload. Hypertensive urgency   Elevated troponin level not due myocardial infarction   Hyperlipidemia   Debility   Paroxysmal atrial fibrillation (HCC)   Anemia due to chronic kidney disease   Anasarca associated with disorder of kidney   Acute kidney injury superimposed on chronic kidney disease (HCC)   Acute respiratory distress   Bilateral lower extremity edema   Hypocalcemia   Subclinical hypothyroidism   Transaminitis   Discharged Condition: stable  Hospital Course: Patient is a 71 year old African-American female with past medical history significant for chronic kidney disease stage IV, hypertension, paroxysmal atrial fibrillation, hyperlipidemia and congestive heart failure with preserved ejection fraction.  Patient was admitted with volume overload, edema, pulmonary edema and acute kidney injury on chronic kidney disease stage IV.  Patient was admitted and managed with high-dose IV diuretics.  Edema has improved significantly.  Patient was transitioned to torsemide 50 Mg p.o. twice daily.  Patient will be discharged home on torsemide 50 Mg p.o. twice daily.  Patient has continued to maintain that she would not want dialysis.  Patient will follow-up with primary care provider and nephrology team on discharge.  Resolved acute respiratory distress Acute congestive heart failure with preserved ejection fraction, 60%.  Class IV Anasarca/left sided effusion. -Echo in January 2023 showed EF 55% with grade 2 DD.  - Echocardiogram- ef 55%, g1DD, large left effusion.  - Cont fluid restriction.  Monitor input and output -PhosLo held per Nephrology -Pt noted to be  very slow to diurese, Nephrology offered starting HD. Pt wished to continue diuresis. -IV lasix was changed to torsemide given concerns of rising BUN and Cr, continue. 06/16/2022: Continue torsemide.  Patient continues to refuse renal replacement therapy.     Community-acquired pneumonia, POA Patient was initially on doxycycline and Rocephin. Rocephin was transitioned to Augmentin. MRSA screening test positive   Anemia, likely from chronic disease - Hemoglobin at 7.  Agrees to get 1 more unit of blood which was ordered.  Received another unit prior during this current hospitalization Hemoglobin 7.7 on 06/13/2022. 06/14/2022: Patient has anemia of chronic kidney disease versus multifactorial etiology.  Will defer need for iron and ESA to the nephrology team.  Hemoglobin is not at goal.    Elevated troponin Flat, chest pain-free.  Demand ischemia.   Acute kidney injury superimposed on chronic kidney disease stage IV -Baseline creatinine 3, admission creatinine 4.  Recently Foley catheter change.  Getting high-dose diuretics with albumin.  Phosphorus is up, will defer to nephro to manage this.  Unfortunately it appears patient is heading towards dialysis - discontinued losartan 06/14/2022: Suspect this may be a case of progressive chronic kidney disease, known CKD 5/ESRD.   Essential hypertension, uncontrolled - On hydralazine 50 mg 3 times daily and Coreg -Now on torsemide -Normal sinus rhythm, not on anticoagulation 06/14/2022: Blood pressures controlled.   Hypocalcemia Stable Corrected Ca 9.18   Subclinical hypothyroidism TSH slightly elevated but T4 normal.   Transaminitis Suspect from hepatic congestion.  Will monitor   Hypoalbuminemia Chronic malnutrition.  Encourage PO as tolerated 06/14/2022: Low threshold to consult podiatry team, considering CKD 5/possible ESRD.   Debility Due to worsening swelling and pulmonary edema -Therapy recs for HHPT/OT at time of d/c    Hypothermia  Another episode of hypothermia, when vitals were checked for blood transfusion.  Similar episode happened couple of days ago.  Concern of UTI and she was started on ceftriaxone and doxycycline. Foley catheter was exchanged and cultures were sent. Blood cultures at that time remain negative. Patient improved with bear hugger. -Patient was given another bear hugger -Will repeat blood culture -Continue with antibiotics and follow-up cultures  UTI due to foley catheter: -Urine culture grew Enterococcus faecalis sensitive to Ampicillin -Complete course of Augmentin.   Consults: Nephrology   Discharge Exam: Blood pressure (!) 152/67, pulse 62, temperature 98.3 F (36.8 C), temperature source Oral, resp. rate 15, height 5\' 5"  (1.651 m), weight 91.2 kg, SpO2 99 %.   Disposition: Discharge disposition: 01-Home or Self Care       Discharge Instructions     Diet - low sodium heart healthy   Complete by: As directed    Increase activity slowly   Complete by: As directed       Allergies as of 06/17/2022       Reactions   Crestor [rosuvastatin] Other (See Comments)   "Felt like she couldn't swallow"        Medication List     STOP taking these medications    losartan 25 MG tablet Commonly known as: COZAAR   multivitamin with minerals Tabs tablet   SLOW FE PO Replaced by: ferrous sulfate 325 (65 FE) MG tablet   sodium bicarbonate 650 MG tablet   VITAMIN C PO   Zinc 50 MG Caps       TAKE these medications    amoxicillin-clavulanate 500-125 MG tablet Commonly known as: AUGMENTIN Take 1 tablet (500 mg total) by mouth every 12 (twelve) hours for 4 days.   carvedilol 6.25 MG tablet Commonly known as: COREG Take 1 tablet (6.25 mg total) by mouth 2 (two) times daily with a meal. What changed:  medication strength See the new instructions.   feeding supplement Liqd Take 237 mLs by mouth 2 (two) times daily between meals.   ferrous sulfate 325  (65 FE) MG tablet Take 1 tablet (325 mg total) by mouth daily. Replaces: SLOW FE PO   hydrALAZINE 50 MG tablet Commonly known as: APRESOLINE Take 1 tablet (50 mg total) by mouth every 8 (eight) hours.   multivitamin Tabs tablet Take 1 tablet by mouth at bedtime.   Muscle Rub 10-15 % Crea Apply 1 Application topically as needed for muscle pain.   senna-docusate 8.6-50 MG tablet Commonly known as: Senokot-S Take 1 tablet by mouth at bedtime as needed for moderate constipation.   sevelamer carbonate 2.4 g Pack Commonly known as: RENVELA Take 2.4 g by mouth 3 (three) times daily with meals.   torsemide 10 MG tablet Commonly known as: DEMADEX Take 5 tablets (50 mg total) by mouth 2 (two) times daily. What changed:  medication strength how much to take   Vitamin D (Ergocalciferol) 1.25 MG (50000 UNIT) Caps capsule Commonly known as: DRISDOL Take 50,000 Units by mouth once a week.               Durable Medical Equipment  (From admission, onward)           Start     Ordered   06/15/22 1513  For home use only DME Other see comment  Once       Comments: Harrel Lemon Lift  Question:  Length of Need  Answer:  Lifetime   06/15/22 1513   06/15/22 1513  For home use only DME Bedside commode  Once       Comments: With drop arm Lifetime need  Question:  Patient needs a bedside commode to treat with the following condition  Answer:  Weakness   06/15/22 1513             Signed: Bonnell Public 06/17/2022, 12:48 PM

## 2022-06-18 ENCOUNTER — Other Ambulatory Visit: Payer: Self-pay

## 2022-06-18 ENCOUNTER — Other Ambulatory Visit: Payer: Self-pay | Admitting: Internal Medicine

## 2022-06-18 DIAGNOSIS — I5042 Chronic combined systolic (congestive) and diastolic (congestive) heart failure: Secondary | ICD-10-CM | POA: Diagnosis not present

## 2022-06-18 DIAGNOSIS — I5041 Acute combined systolic (congestive) and diastolic (congestive) heart failure: Secondary | ICD-10-CM

## 2022-06-18 DIAGNOSIS — U071 COVID-19: Secondary | ICD-10-CM | POA: Diagnosis not present

## 2022-06-18 DIAGNOSIS — N1832 Chronic kidney disease, stage 3b: Secondary | ICD-10-CM | POA: Diagnosis not present

## 2022-06-18 DIAGNOSIS — N185 Chronic kidney disease, stage 5: Secondary | ICD-10-CM | POA: Diagnosis not present

## 2022-06-18 DIAGNOSIS — R531 Weakness: Secondary | ICD-10-CM | POA: Diagnosis not present

## 2022-06-18 DIAGNOSIS — I160 Hypertensive urgency: Secondary | ICD-10-CM

## 2022-06-18 DIAGNOSIS — A419 Sepsis, unspecified organism: Secondary | ICD-10-CM | POA: Diagnosis not present

## 2022-06-19 ENCOUNTER — Telehealth: Payer: Self-pay | Admitting: *Deleted

## 2022-06-19 ENCOUNTER — Ambulatory Visit: Payer: Self-pay

## 2022-06-19 DIAGNOSIS — I5041 Acute combined systolic (congestive) and diastolic (congestive) heart failure: Secondary | ICD-10-CM

## 2022-06-19 NOTE — Patient Outreach (Signed)
  Care Coordination   Initial Visit Note   06/19/2022 Name: Kara Mullins MRN: [id number] DOB: 1951-09-14  Kara Mullins is a 70 y.o. year old female who sees Kara Mullins, Kara Bucks, NP for primary care. I  spoke with patients daughter and primary caregiver Kara Mullins by phone  What matters to the patients health and wellness today?  To remain in the home    Goals Addressed             This Visit's Progress    Care Coordination Activities       Care Coordination Interventions: SDoH screening performed - discussed the patients family is providing transportation but it takes 3 people to get patient in the car Discussed plans for SW to place a referral to the community resource care guide team to assist with transportation to [place] appointment on 9/11 with [place] the patient does not have concerns with medication costs and has picked up newly prescribed medications since recent inpatient stay Performed chart review to note patient was to be discharged home with orders for a hoyer lift and bedside commode - confirmed these items have been received at patients home Discussed the patient also has a walker, wheelchair, and hospital bed in the home. Patients daughter would like something similar to a "Marketing executive" which was used during her recent inpatient stay Advised SW would request RN Care Manager discuss this with the patient and her daughter to determine if this is similar to a sit to stand lift Reviewed discharge summary with the patients daughter confirming home health has been in contact and will visit the home on 06/20/22 for start of care Discussed the patients daughter, son in law, and ex-husband assist with care in the home but more care is desired. Family does not want patient placed due to concerns of decline during a previous placement. Patient previously sought Medicaid and was told she does not qualify at this time due to assets Education provided on [facility] as well as the [employer] in home aid program Mailed education materials for review on [facility] Unsuccessful outbound call placed to Kara Mullins with the [facility] in home aid program - call back requested in order to add patient to the wait list Scheduled call with Kara Mullins on 9/18 at 3:30 pm Collaboration with [place] to advise of interventions and plan        SDOH assessments and interventions completed:  Yes  SDOH Interventions Today    Flowsheet Row Most Recent Value  SDOH Interventions   Food Insecurity Interventions Intervention Not Indicated  Housing Interventions Intervention Not Indicated  Transportation Interventions Ambulatory [id number] Order  Kara Mullins is currently driving patient but reports she is a 3 person transfer. Referral to care guide team for [facility] transportation]  Utilities Interventions Intervention Not Indicated        Care Coordination Interventions Activated:  Yes  Care Coordination Interventions:  Yes, provided   Follow up plan: Referral made to community resource care guide team Follow up call scheduled with [place], SW will continue to follow     Encounter Outcome:  Pt. Visit Completed   Daneen Schick, BSW, CDP Social Worker, Certified Dementia Practitioner Care Coordination [phone]

## 2022-06-19 NOTE — Patient Instructions (Signed)
Visit Information  Thank you for taking time to visit with me today. Please don't hesitate to contact me if I can be of assistance to you.   Following are the goals we discussed today:   Goals Addressed             This Visit's Progress    Care Coordination Activities       Care Coordination Interventions: SDoH screening performed - discussed the patients family is providing transportation but it takes 3 people to get patient in the car Discussed plans for SW to place a referral to the community resource care guide team to assist with transportation to [place] appointment on 9/11 with [place] the patient does not have concerns with medication costs and has picked up newly prescribed medications since recent inpatient stay Performed chart review to note patient was to be discharged home with orders for a hoyer lift and bedside commode - confirmed these items have been received at patients home Discussed the patient also has a walker, wheelchair, and hospital bed in the home. Patients daughter would like something similar to a "Marketing executive" which was used during her recent inpatient stay Advised SW would request RN Care Manager discuss this with the patient and her daughter to determine if this is similar to a sit to stand lift Reviewed discharge summary with the patients daughter confirming home health has been in contact and will visit the home on 06/20/22 for start of care Discussed the patients daughter, son in law, and ex-husband assist with care in the home but more care is desired. Family does not want patient placed due to concerns of decline during a previous placement. Patient previously sought Medicaid and was told she does not qualify at this time due to assets Education provided on [facility] as well as the [facility] in home aid program Mailed education materials for review on [facility] Unsuccessful outbound call placed to Inda Merlin  with the [facility] in home aid program - call back requested in order to add patient to the wait list Scheduled call with [place] on 9/18 at 3:30 pm Collaboration with RN Care Manager to advise of interventions and plan         If you are experiencing a [place] or [name] or need someone to talk to, please call 1-800-273-TALK (toll free, 24 hour hotline)  Patient verbalizes understanding of instructions and care plan provided today and agrees to view in [place]. Active MyChart status and patient understanding of how to access instructions and care plan via MyChart confirmed with patient.     Telephone follow up appointment with care management team member scheduled for:9/18  Daneen Schick, BSW, CDP Social Worker, Certified Dementia Practitioner Care Coordination [phone]

## 2022-06-19 NOTE — Chronic Care Management (AMB) (Signed)
  Care Coordination   Note   06/19/2022 Name: [name] MRN: [id number] DOB: 1951-05-02  [name] is a 71 y.o. year old female who sees [name], [name], NP for primary care. I reached out to [name] by phone today to offer care coordination services.  [name] was given information about Care Coordination services today including:   The Care Coordination services include support from the care team which includes your Nurse Coordinator, Clinical Social Worker, or Pharmacist.  The Care Coordination team is here to help remove barriers to the health concerns and goals most important to you. Care Coordination services are voluntary, and the patient may decline or stop services at any time by request to their care team member.   Care Coordination Consent Status: Patient agreed to services and verbal consent obtained.  Calling daughter Montine Hight per patient  Follow up plan:  Telephone appointment with care coordination team member scheduled for:  06/19/22  Encounter Outcome:  Pt. Scheduled  [place]  Direct Dial: [phone]

## 2022-06-20 ENCOUNTER — Telehealth: Payer: Self-pay

## 2022-06-20 DIAGNOSIS — R5381 Other malaise: Secondary | ICD-10-CM | POA: Diagnosis not present

## 2022-06-20 DIAGNOSIS — J15212 Pneumonia due to Methicillin resistant Staphylococcus aureus: Secondary | ICD-10-CM | POA: Diagnosis not present

## 2022-06-20 DIAGNOSIS — E785 Hyperlipidemia, unspecified: Secondary | ICD-10-CM | POA: Diagnosis not present

## 2022-06-20 DIAGNOSIS — D631 Anemia in chronic kidney disease: Secondary | ICD-10-CM | POA: Diagnosis not present

## 2022-06-20 DIAGNOSIS — F411 Generalized anxiety disorder: Secondary | ICD-10-CM | POA: Diagnosis not present

## 2022-06-20 DIAGNOSIS — E02 Subclinical iodine-deficiency hypothyroidism: Secondary | ICD-10-CM | POA: Diagnosis not present

## 2022-06-20 DIAGNOSIS — I480 Paroxysmal atrial fibrillation: Secondary | ICD-10-CM | POA: Diagnosis not present

## 2022-06-20 DIAGNOSIS — I130 Hypertensive heart and chronic kidney disease with heart failure and stage 1 through stage 4 chronic kidney disease, or unspecified chronic kidney disease: Secondary | ICD-10-CM | POA: Diagnosis not present

## 2022-06-20 DIAGNOSIS — R7401 Elevation of levels of liver transaminase levels: Secondary | ICD-10-CM | POA: Diagnosis not present

## 2022-06-20 DIAGNOSIS — E8809 Other disorders of plasma-protein metabolism, not elsewhere classified: Secondary | ICD-10-CM | POA: Diagnosis not present

## 2022-06-20 DIAGNOSIS — Z9181 History of falling: Secondary | ICD-10-CM | POA: Diagnosis not present

## 2022-06-20 DIAGNOSIS — I440 Atrioventricular block, first degree: Secondary | ICD-10-CM | POA: Diagnosis not present

## 2022-06-20 NOTE — Telephone Encounter (Signed)
Patient's daughter called and stated that patient is feeling nauseous. She is also constipated,she hasn't been to the bathroom since Monday. She was given an enema,but hasn't worked. She would like to know what to do.  Message routed to Sherrie Mustache, NP

## 2022-06-20 NOTE — Telephone Encounter (Signed)
Has she been using the senna-docusate (SENOKOT-S) 8.6-50 MG tablet daily which is on her medication list to use as needed constipation?

## 2022-06-20 NOTE — Telephone Encounter (Signed)
Patient's daughter says patient took senokot early today. But has not taken before today because patient scared she is going to get diarrhea.

## 2022-06-20 NOTE — Telephone Encounter (Signed)
   Telephone encounter was:  Unsuccessful.  06/20/2022 Name: Mckinzy Fuller MRN: [id number] DOB: 10-Jul-1951  Unsuccessful outbound call made today to assist with:  Transportation Needs   Outreach Attempt:  1st Attempt  Daughter unable to speak at this time and advised she will call me back.  [place] management  [place], [place]  Main Phone: [phone]  E-mail: [email or web address]  Website: [email or web address]

## 2022-06-21 ENCOUNTER — Telehealth: Payer: Self-pay

## 2022-06-21 NOTE — Telephone Encounter (Signed)
Please have her continue this and increase water intake.

## 2022-06-21 NOTE — Telephone Encounter (Signed)
I spoke with patient's daughter and she verbalized her understanding and agreed.

## 2022-06-21 NOTE — Patient Outreach (Signed)
  Care Coordination   Follow Up Visit Note   06/21/2022 Name: Kara Mullins MRN: [id number] DOB: Mar 30, 1951  Kara Mullins is a 71 y.o. year old female who sees [name], [name], NP for primary care. I  attempted to place patient on the in home aid wait list today. Voice message left with Inda Merlin requesting a return call.   SDOH assessments and interventions completed:  No   Care Coordination Interventions Activated:  No  Care Coordination Interventions:  No, not indicated   Follow up plan:  SW will follow up with in home aid program over the next 5 days to place patient on the waiting list.    Encounter Outcome:  No Answer   Daneen Schick, BSW, CDP Social Worker, Certified Dementia Practitioner Care Coordination [phone]

## 2022-06-24 ENCOUNTER — Encounter: Payer: Self-pay | Admitting: Adult Health

## 2022-06-24 ENCOUNTER — Telehealth: Payer: Self-pay

## 2022-06-24 ENCOUNTER — Telehealth (INDEPENDENT_AMBULATORY_CARE_PROVIDER_SITE_OTHER): Payer: Medicare (Managed Care) | Admitting: Adult Health

## 2022-06-24 ENCOUNTER — Encounter: Payer: Medicare (Managed Care) | Admitting: Adult Health

## 2022-06-24 DIAGNOSIS — R7401 Elevation of levels of liver transaminase levels: Secondary | ICD-10-CM | POA: Diagnosis not present

## 2022-06-24 DIAGNOSIS — I130 Hypertensive heart and chronic kidney disease with heart failure and stage 1 through stage 4 chronic kidney disease, or unspecified chronic kidney disease: Secondary | ICD-10-CM

## 2022-06-24 DIAGNOSIS — R600 Localized edema: Secondary | ICD-10-CM

## 2022-06-24 DIAGNOSIS — E785 Hyperlipidemia, unspecified: Secondary | ICD-10-CM | POA: Diagnosis not present

## 2022-06-24 DIAGNOSIS — N185 Chronic kidney disease, stage 5: Secondary | ICD-10-CM | POA: Diagnosis not present

## 2022-06-24 DIAGNOSIS — E02 Subclinical iodine-deficiency hypothyroidism: Secondary | ICD-10-CM | POA: Diagnosis not present

## 2022-06-24 DIAGNOSIS — I10 Essential (primary) hypertension: Secondary | ICD-10-CM

## 2022-06-24 DIAGNOSIS — R5381 Other malaise: Secondary | ICD-10-CM

## 2022-06-24 DIAGNOSIS — I480 Paroxysmal atrial fibrillation: Secondary | ICD-10-CM | POA: Diagnosis not present

## 2022-06-24 DIAGNOSIS — I5041 Acute combined systolic (congestive) and diastolic (congestive) heart failure: Secondary | ICD-10-CM | POA: Diagnosis not present

## 2022-06-24 DIAGNOSIS — F411 Generalized anxiety disorder: Secondary | ICD-10-CM | POA: Diagnosis not present

## 2022-06-24 DIAGNOSIS — D631 Anemia in chronic kidney disease: Secondary | ICD-10-CM | POA: Diagnosis not present

## 2022-06-24 DIAGNOSIS — J189 Pneumonia, unspecified organism: Secondary | ICD-10-CM | POA: Diagnosis not present

## 2022-06-24 DIAGNOSIS — I440 Atrioventricular block, first degree: Secondary | ICD-10-CM | POA: Diagnosis not present

## 2022-06-24 DIAGNOSIS — J15212 Pneumonia due to Methicillin resistant Staphylococcus aureus: Secondary | ICD-10-CM | POA: Diagnosis not present

## 2022-06-24 DIAGNOSIS — N179 Acute kidney failure, unspecified: Secondary | ICD-10-CM | POA: Diagnosis not present

## 2022-06-24 DIAGNOSIS — E8809 Other disorders of plasma-protein metabolism, not elsewhere classified: Secondary | ICD-10-CM | POA: Diagnosis not present

## 2022-06-24 DIAGNOSIS — Z9181 History of falling: Secondary | ICD-10-CM | POA: Diagnosis not present

## 2022-06-24 DIAGNOSIS — I5042 Chronic combined systolic (congestive) and diastolic (congestive) heart failure: Secondary | ICD-10-CM

## 2022-06-24 MED ORDER — TORSEMIDE 10 MG PO TABS: 50.0000 mg | ORAL_TABLET | Freq: Two times a day (BID) | ORAL | 0 refills | Status: DC

## 2022-06-24 NOTE — Telephone Encounter (Signed)
   Telephone encounter was:  Successful.  06/24/2022 Name: Marguetta Windish MRN: [id number] DOB: 05/26/1951  Bethanee Redondo is a 71 y.o. year old female who is a primary care patient of [name], [name], NP . The community resource team was consulted for assistance with Transportation Needs   Care guide performed the following interventions: Patient provided with information about care guide support team and interviewed to confirm resource needs. CG spoke to daughter regarding transportation. CG advised the steps and information on how all transportation ([place], [employer], and [facility]) works.Daughter advised patient does have a transportation benefit with [employer] and they have not used it yet. CG encouraged daughter to utilize [place] when she needs to and daughter understood. CG inquired if daughter was interesting in [place] and daughter advised not at this time as she believes her and dad can take patient to appointments. Daughter advised patients transportation needs are met at this time, but will call me if further assistance is needed.   Follow Up Plan:  No further follow up planned at this time. The patient has been provided with needed resources.  [place] management  [place], [place]  Main Phone: [phone]  E-mail: [email or web address]  Website: [email or web address]

## 2022-06-24 NOTE — Progress Notes (Unsigned)
This service is provided via telemedicine  No vital signs collected/recorded due to the encounter was a telemedicine visit.   Location of patient (ex: home, work):  home  Patient consents to a telephone visit:  yes  Location of the provider (ex: office, home):  [facility]  Name of any referring provider:  N/a  Names of all persons participating in the telemedicine service and their role in the encounter:  patient, daughter, Earl [facility], Durenda Age, NP  Time spent on call:  5 minutes     DATE:  06/24/2022 MRN:  [id number]  BIRTHDAY: 04/18/1951   Contact Information     Name Relation Home Work Mobile   [place] Daughter   [phone]        Code Status History     Date Active Date Inactive Code Status Order ID Comments User Context   06/02/2022 0742 06/17/2022 2326 Full Code [id number]  Norval Morton, MD ED   03/27/2022 0739 04/09/2022 0043 Full Code [id number]  Reubin Milan, MD ED   12/22/2021 0513 01/03/2022 1624 Full Code [id number]  Chotiner, Yevonne Aline, MD ED   11/17/2021 1741 11/24/2021 0051 DNR [id number]  Barb Merino, MD Inpatient   11/09/2021 0734 11/17/2021 1741 Full Code [id number]  Reubin Milan, MD Inpatient   03/05/2021 0735 03/15/2021 1931 Full Code [id number]  Shalhoub, Sherryll Burger, MD ED        Chief Complaint  Patient presents with   Hospitalization Follow-up    Patient is here for a hospital f/u    HISTORY OF PRESENT ILLNESS: This is a 71 year old female who had a video visit today with daughter. Patient was recently hospitalized 8/20 to 06/17/22 for volume overload, edema, pulmonary edema and acute kidney injury on chronic kidney disease stage IV.  She was given high-dose IV diuretics.  Edema improved significantly.  She was then transitioned to torsemide 50 mg p.o. twice daily.  She does not want to have hemodialysis.  She was also treated for community-acquired pneumonia and was initially on  doxycycline and Rocephin.  Rocephin was transitioned to Augmentin.  MRSA screening test was positive.  She was treated for anemia with hemoglobin at 7.,  Transfused 2 units PRBC.  It was thought that anemia is due to chronic kidney disease versus multifactorial etiology.  [place], OT and Nurse comes to the house. Daughter had just taken BP 157/78. She has not taken her Hydralazine, Torsemide and Coreg for the day according to daughter. Daughter has applied ace wrap bandage on BLE due to lower extremity edema. She denies shortness of breath, coughing, fever nor chills. She has completed Augmentin  course. Daughter wants to have FMLA at her work since she has to bring patient to appointments 3-4 days a week and has flare ups which would require her to stay home. Daughter said that patient has foley catheter at present.  PAST MEDICAL HISTORY:  Past Medical History:  Diagnosis Date   Chronic combined systolic and diastolic congestive heart failure ([place]) 11/09/2021   Generalized anxiety disorder    Hyperlipidemia 11/09/2021   Hypertension    Stage 3b chronic kidney disease (CKD) ([place]) 11/09/2021     CURRENT MEDICATIONS: Reviewed  Patient's Medications  New Prescriptions   No medications on file  Previous Medications   CARVEDILOL (COREG) 6.25 MG TABLET    Take 1 tablet (6.25 mg total) by mouth 2 (two) times daily with a meal.   FEEDING SUPPLEMENT (ENSURE  ENLIVE / ENSURE PLUS) LIQD    Take 237 mLs by mouth 2 (two) times daily between meals.   FERROUS SULFATE 325 (65 FE) MG TABLET    Take 1 tablet (325 mg total) by mouth daily.   HYDRALAZINE (APRESOLINE) 50 MG TABLET    Take 1 tablet (50 mg total) by mouth every 8 (eight) hours.   MENTHOL-METHYL SALICYLATE (MUSCLE RUB) 10-15 % CREA    Apply 1 Application topically as needed for muscle pain.   MULTIVITAMIN (RENA-VIT) TABS TABLET    Take 1 tablet by mouth at bedtime.   SENNA-DOCUSATE (SENOKOT-S) 8.6-50 MG TABLET    Take 1 tablet by  mouth at bedtime as needed for moderate constipation.   SEVELAMER CARBONATE (RENVELA) 2.4 G PACK    Take 2.4 g by mouth 3 (three) times daily with meals.   VITAMIN D, ERGOCALCIFEROL, (DRISDOL) 1.25 MG (50000 UNIT) CAPS CAPSULE    Take 50,000 Units by mouth once a week.  Modified Medications   Modified Medication Previous Medication   TORSEMIDE (DEMADEX) 10 MG TABLET torsemide (DEMADEX) 10 MG tablet      Take 5 tablets (50 mg total) by mouth 2 (two) times daily.    Take 5 tablets (50 mg total) by mouth 2 (two) times daily.  Discontinued Medications   No medications on file     Allergies  Allergen Reactions   Crestor [Rosuvastatin] Other (See Comments)    "Felt like she couldn't swallow"     REVIEW OF SYSTEMS:  GENERAL: no change in appetite, no fatigue, no weight changes, no fever, chills SKIN: Denies rash, itching, wounds, ulcer sores, or nail abnormality EYES: Denies change in vision, dry eyes, eye pain, itching or discharge EARS: Denies change in hearing, ringing in ears, or earache NOSE: Denies nasal congestion or epistaxis MOUTH and THROAT: Denies oral discomfort, gingival pain or bleeding, pain from teeth or hoarseness   RESPIRATORY: no cough, SOB, DOE, wheezing, hemoptysis CARDIAC: lower extremity edema GI: no abdominal pain, diarrhea, constipation, heart burn, nausea or vomiting GU: has foley catheter NEUROLOGICAL: Denies dizziness, syncope, numbness, or headache PSYCHIATRIC: Denies feeling of depression or anxiety. No report of hallucinations, insomnia, paranoia, or agitation   LABS/RADIOLOGY: Labs reviewed: Basic Metabolic Panel: Recent Labs    06/10/22 0747 06/11/22 0105 06/13/22 0326 06/14/22 0631 06/17/22 0231  NA 139   < > 139 140 142  K 3.6   < > 3.5 3.5 3.7  CL 108   < > 107 105 110  CO2 22   < > 22 22 23   GLUCOSE 77   < > 73 82 82  BUN 94*   < > 95* 93* 91*  CREATININE 4.68*   < > 4.93* 4.99* 4.93*  CALCIUM 7.7*   < > 7.8* 8.1* 7.9*  MG 2.7*  --    --  2.4 2.4  PHOS  --    < > 8.1* 8.4* 8.4*   < > = values in this interval not displayed.   Liver Function Tests: Recent Labs    06/03/22 0737 06/04/22 0449 06/10/22 0747 06/11/22 0105 06/12/22 0644 06/13/22 0326 06/14/22 0631 06/17/22 0231  AST 42*  --  128* 141*  --   --   --   --   ALT 35  --  94* 106*  --   --   --   --   ALKPHOS 126  --  114 109  --   --   --   --  BILITOT 0.2*  --  0.4 <0.1*  --   --   --   --   PROT 5.3*  --  5.5* 5.1*  --   --   --   --   ALBUMIN <1.5*  <1.5*   < > 2.0* 1.9*   < > 2.5* 2.2* 1.8*   < > = values in this interval not displayed.   Recent Labs    11/08/21 2357  LIPASE 65*   No results for input(s): "AMMONIA" in the last 8760 hours. CBC: Recent Labs    04/22/22 0000 04/24/22 2009 06/02/22 0311 06/02/22 0337 06/12/22 0644 06/13/22 0326 06/14/22 0631  WBC 5.5 6.5 5.0   < > 3.6* 4.3 4.1  NEUTROABS 3.30 4.4 3.2  --   --   --   --   HGB 7.7* 9.2* 8.7*   < > 7.0* 7.7* 8.4*  HCT 24* 29.1* 26.5*   < > 22.2* 23.2* 25.4*  MCV  --  96.0 92.7   < > 92.5 88.5 88.5  PLT 275 300 175   < > 142* 147* 169   < > = values in this interval not displayed.   A1C: Invalid input(s): "A1C" Lipid Panel: Recent Labs    08/27/21 0956  HDL 67   Cardiac Enzymes: No results for input(s): "CKTOTAL", "CKMB", "CKMBINDEX", "TROPONINI" in the last 8760 hours. BNP: Invalid input(s): "POCBNP" CBG: Recent Labs    12/21/21 2359 01/02/22 1324 06/11/22 0616  GLUCAP 73 114* 80      DG Chest 1 View  Result Date: 06/10/2022 CLINICAL DATA:  Pneumonia EXAM: CHEST  1 VIEW COMPARISON:  06/02/2022, CT 03/05/2021, radiograph 03/27/2022 FINDINGS: Cardiomegaly with vascular congestion, pulmonary edema and at least moderate left greater than right pleural effusions. Basilar airspace disease. Findings appear worse when compared to 06/02/2022. IMPRESSION: 1. Cardiomegaly with vascular congestion and pulmonary edema. 2. Worsened bilateral pleural effusions left  greater than right at least moderate in size with worsening aeration of the right base. Persistent dense consolidation in the left lung base Electronically Signed   By: Donavan Foil M.D.   On: 06/10/2022 22:52   ECHOCARDIOGRAM COMPLETE  Result Date: 06/03/2022    ECHOCARDIOGRAM REPORT   Patient Name:   Kara Mullins Date of Exam: 06/03/2022 Medical Rec #:  [id number]      Height:       65.0 in Accession #:    [id number]     Weight:       209.4 lb Date of Birth:  Apr 04, 1951     BSA:          2.017 m Patient Age:    45 years       BP:           156/81 mmHg Patient Gender: F              HR:           62 bpm. Exam Location:  Inpatient Procedure: 2D Echo, Cardiac Doppler and Color Doppler Indications:    CHF  History:        Patient has prior history of Echocardiogram examinations, most                 recent 11/10/2021. CHF; Risk Factors:Hypertension and                 Dyslipidemia.  Sonographer:    Memory Argue Referring Phys: [id number] RONDELL A SMITH IMPRESSIONS  1. Left ventricular ejection fraction, by  estimation, is 55%. The left ventricle has normal function. The left ventricle has no regional wall motion abnormalities. The left ventricular internal cavity size was mildly dilated. There is moderate left ventricular hypertrophy. Left ventricular diastolic parameters are consistent with Grade I diastolic dysfunction (impaired relaxation).  2. Right ventricular systolic function is normal. The right ventricular size is normal. There is moderately elevated pulmonary artery systolic pressure.  3. Left atrial size was moderately dilated.  4. Large left effusion persists.  5. The mitral valve is abnormal. Trivial mitral valve regurgitation. No evidence of mitral stenosis.  6. The aortic valve is tricuspid. There is mild calcification of the aortic valve. There is mild thickening of the aortic valve. Aortic valve regurgitation is trivial. Aortic valve sclerosis is present, with no evidence of aortic valve stenosis.   7. The inferior vena cava is dilated in size with >50% respiratory variability, suggesting right atrial pressure of 8 mmHg. FINDINGS  Left Ventricle: Left ventricular ejection fraction, by estimation, is 55%. The left ventricle has normal function. The left ventricle has no regional wall motion abnormalities. The left ventricular internal cavity size was mildly dilated. There is moderate left ventricular hypertrophy. Left ventricular diastolic parameters are consistent with Grade I diastolic dysfunction (impaired relaxation). Right Ventricle: The right ventricular size is normal. No increase in right ventricular wall thickness. Right ventricular systolic function is normal. There is moderately elevated pulmonary artery systolic pressure. The tricuspid regurgitant velocity is 2.90 m/s, and with an assumed right atrial pressure of 15 mmHg, the estimated right ventricular systolic pressure is 37.9 mmHg. Left Atrium: Left atrial size was moderately dilated. Right Atrium: Right atrial size was normal in size. Pericardium: Large left effusion persists. There is no evidence of pericardial effusion. Mitral Valve: The mitral valve is abnormal. There is mild thickening of the mitral valve leaflet(s). Mild mitral annular calcification. Trivial mitral valve regurgitation. No evidence of mitral valve stenosis. Tricuspid Valve: The tricuspid valve is normal in structure. Tricuspid valve regurgitation is mild . No evidence of tricuspid stenosis. Aortic Valve: The aortic valve is tricuspid. There is mild calcification of the aortic valve. There is mild thickening of the aortic valve. Aortic valve regurgitation is trivial. Aortic valve sclerosis is present, with no evidence of aortic valve stenosis. Aortic valve mean gradient measures 4.0 mmHg. Aortic valve peak gradient measures 7.0 mmHg. Aortic valve area, by VTI measures 2.42 cm. Pulmonic Valve: The pulmonic valve was normal in structure. Pulmonic valve regurgitation is not  visualized. No evidence of pulmonic stenosis. Aorta: The aortic root is normal in size and structure. Venous: The inferior vena cava is dilated in size with greater than 50% respiratory variability, suggesting right atrial pressure of 8 mmHg. IAS/Shunts: No atrial level shunt detected by color flow Doppler. Additional Comments: There is pleural effusion in the left lateral region.  LEFT VENTRICLE PLAX 2D LVIDd:         4.90 cm   Diastology LVIDs:         3.40 cm   LV e' medial:    4.90 cm/s LV PW:         1.30 cm   LV E/e' medial:  15.1 LV IVS:        1.50 cm   LV e' lateral:   7.51 cm/s LVOT diam:     2.00 cm   LV E/e' lateral: 9.8 LV SV:         92 LV SV Index:   46 LVOT Area:  3.14 cm  RIGHT VENTRICLE RV S prime:     15.10 cm/s TAPSE (M-mode): 2.6 cm LEFT ATRIUM              Index        RIGHT ATRIUM           Index LA diam:        2.70 cm  1.34 cm/m   RA Area:     17.90 cm LA Vol (A2C):   105.0 ml 52.05 ml/m  RA Volume:   47.90 ml  23.74 ml/m LA Vol (A4C):   116.0 ml 57.50 ml/m LA Biplane Vol: 113.0 ml 56.02 ml/m  AORTIC VALVE AV Area (Vmax):    2.64 cm AV Area (Vmean):   2.47 cm AV Area (VTI):     2.42 cm AV Vmax:           132.00 cm/s AV Vmean:          94.000 cm/s AV VTI:            0.381 m AV Peak Grad:      7.0 mmHg AV Mean Grad:      4.0 mmHg LVOT Vmax:         111.00 cm/s LVOT Vmean:        73.900 cm/s LVOT VTI:          0.293 m LVOT/AV VTI ratio: 0.77  AORTA Ao Root diam: 3.70 cm Ao Asc diam:  3.20 cm MITRAL VALVE                TRICUSPID VALVE MV Area (PHT): 3.03 cm     TR Peak grad:   33.6 mmHg MV Decel Time: 250 msec     TR Vmax:        290.00 cm/s MR Peak grad: 64.3 mmHg MR Vmax:      401.00 cm/s   SHUNTS MV E velocity: 73.90 cm/s   Systemic VTI:  0.29 m MV A velocity: 122.00 cm/s  Systemic Diam: 2.00 cm MV E/A ratio:  0.61 Jenkins Rouge MD Electronically signed by Jenkins Rouge MD Signature Date/Time: 06/03/2022/11:27:15 AM    Final    DG Chest Port 1 View  Result Date:  06/02/2022 CLINICAL DATA:  Shortness of breath. EXAM: PORTABLE CHEST 1 VIEW COMPARISON:  03/27/2022. FINDINGS: Heart is enlarged and the mediastinal contour is within normal limits. There is atherosclerotic calcification of the aorta. Pulmonary vasculature is distended. Interstitial prominence is noted bilaterally. There small bilateral pleural effusions with mild airspace disease at the lung bases. No pneumothorax. No acute osseous abnormality. IMPRESSION: 1. Cardiomegaly with pulmonary vascular congestion. 2. Interstitial prominence bilaterally, suggesting edema. 3. Small bilateral pleural effusions with atelectasis, edema, or infiltrate. Electronically Signed   By: Brett Fairy M.D.   On: 06/02/2022 03:30    ASSESSMENT/PLAN:  1. Acute combined systolic and diastolic congestive heart failure (HCC) -   had IV Lasix then changed to Torsemide 50 mg BID -  declines hemodialysis  2.  Community acquired pneumonia -  completed Augmentin  3. Anemia of chronic kidney failure, stage 5 (HCC) -  S/P transfusion of 2 units PRBC - For home use only DME Other see comment, for CBC with differentials in 2 weeks -  continue ferrous sulfate 325 mg daily  4. Essential hypertension -Continue Coreg, hydralazine and torsemide -Monitor BP and record  5. Edema, lower extremity -   Continue torsemide -  continue ACE wraps on lower extremities  6. Acute  kidney injury superimposed on chronic kidney disease Lab Results  Component Value Date   NA 142 06/17/2022   K 3.7 06/17/2022   CO2 23 06/17/2022   GLUCOSE 82 06/17/2022   BUN 91 (H) 06/17/2022   CREATININE 4.93 (H) 06/17/2022   CALCIUM 7.9 (L) 06/17/2022   EGFR 17 04/22/2022   GFRNONAA 9 (L) 06/17/2022   was thought to be of progressive chronic kidney disease, known CKD 5/ESRD -   Declines hemodialysis -   Losartan was discontinued -   Follow-up with nephrology  7. Physical deconditioning -   Continue home health PT, OT and nursing      Time  spent on non face to face visit:  25  The patient gave consent to this video visit. Explained to the patient the risk and privacy issue that was involved with this video call.   The patient was advised to call back and ask for an in-person evaluation if the symptoms worsen or if the condition fails to improve.   Durenda Age, NP [employer] [phone]

## 2022-06-25 ENCOUNTER — Ambulatory Visit: Payer: Self-pay

## 2022-06-25 NOTE — Patient Outreach (Signed)
  Care Coordination   Follow Up Visit Note   06/25/2022 Name: Kara Mullins MRN: [id number] DOB: [birth year]  Kara Mullins is a 71 y.o. year old female who sees [name], [name], NP for primary care. I  spoke with patients daughter and caregiver [name] by phone  What matters to the patients health and wellness today?  To continue to improve with therapy    Goals Addressed             This Visit's Progress    Care Coordination Activities   On track    Care Coordination Interventions: Successfully placed patient on the wait list for the [facility] in home aid program Outbound call placed to patients daughter and caregiver [name] to update on goal progression Discussed patient is doing well at this time and has been working with home health therapy Reviewed plans for SW to follow up over the next month        SDOH assessments and interventions completed:  No     Care Coordination Interventions Activated:  Yes  Care Coordination Interventions:  Yes, provided   Follow up plan:  SW will follow up over the next month    Encounter Outcome:  Pt. Visit Completed   Daneen Schick, BSW, CDP Social Worker, Certified Dementia Practitioner Care Coordination [phone]

## 2022-06-25 NOTE — Patient Instructions (Signed)
Visit Information  Thank you for taking time to visit with me today. Please don't hesitate to contact me if I can be of assistance to you.   Following are the goals we discussed today:   Goals Addressed             This Visit's Progress    Care Coordination Activities   On track    Care Coordination Interventions: Successfully placed patient on the wait list for the [facility] in home aid program Outbound call placed to patients daughter and caregiver [name] to update on goal progression Discussed patient is doing well at this time and has been working with home health therapy Reviewed plans for SW to follow up over the next month        Our next appointment is by telephone on 10/12 at 3:00  Please call the care guide team at [phone] if you need to cancel or reschedule your appointment.   If you are experiencing a Mental Health or [place] or need someone to talk to, please call 1-800-273-TALK (toll free, 24 hour hotline)  Patient verbalizes understanding of instructions and care plan provided today and agrees to view in [place]. Active MyChart status and patient understanding of how to access instructions and care plan via MyChart confirmed with patient.     Telephone follow up appointment with care management team member scheduled for:10/12  Daneen Schick, BSW, CDP Social Worker, Certified Dementia Practitioner Care Coordination [phone]

## 2022-06-26 DIAGNOSIS — D631 Anemia in chronic kidney disease: Secondary | ICD-10-CM | POA: Diagnosis not present

## 2022-06-26 DIAGNOSIS — F411 Generalized anxiety disorder: Secondary | ICD-10-CM | POA: Diagnosis not present

## 2022-06-26 DIAGNOSIS — E785 Hyperlipidemia, unspecified: Secondary | ICD-10-CM | POA: Diagnosis not present

## 2022-06-26 DIAGNOSIS — E02 Subclinical iodine-deficiency hypothyroidism: Secondary | ICD-10-CM | POA: Diagnosis not present

## 2022-06-26 DIAGNOSIS — R7401 Elevation of levels of liver transaminase levels: Secondary | ICD-10-CM | POA: Diagnosis not present

## 2022-06-26 DIAGNOSIS — J15212 Pneumonia due to Methicillin resistant Staphylococcus aureus: Secondary | ICD-10-CM | POA: Diagnosis not present

## 2022-06-26 DIAGNOSIS — I480 Paroxysmal atrial fibrillation: Secondary | ICD-10-CM | POA: Diagnosis not present

## 2022-06-26 DIAGNOSIS — Z9181 History of falling: Secondary | ICD-10-CM | POA: Diagnosis not present

## 2022-06-26 DIAGNOSIS — I440 Atrioventricular block, first degree: Secondary | ICD-10-CM | POA: Diagnosis not present

## 2022-06-26 DIAGNOSIS — R5381 Other malaise: Secondary | ICD-10-CM | POA: Diagnosis not present

## 2022-06-26 DIAGNOSIS — I130 Hypertensive heart and chronic kidney disease with heart failure and stage 1 through stage 4 chronic kidney disease, or unspecified chronic kidney disease: Secondary | ICD-10-CM | POA: Diagnosis not present

## 2022-06-26 DIAGNOSIS — E8809 Other disorders of plasma-protein metabolism, not elsewhere classified: Secondary | ICD-10-CM | POA: Diagnosis not present

## 2022-06-27 ENCOUNTER — Telehealth: Payer: Self-pay

## 2022-06-27 NOTE — Telephone Encounter (Signed)
Tillie Rung PT with [facility] called requesting verbal orders for twice a week for 2 weeks then once a week for 4 weeks. Verbal orders given.

## 2022-06-28 ENCOUNTER — Telehealth: Payer: Self-pay

## 2022-06-28 DIAGNOSIS — I130 Hypertensive heart and chronic kidney disease with heart failure and stage 1 through stage 4 chronic kidney disease, or unspecified chronic kidney disease: Secondary | ICD-10-CM | POA: Diagnosis not present

## 2022-06-28 NOTE — Telephone Encounter (Signed)
Patient's daughter called requesting order for urine collection to test for UTI. She stated that patient has catheter and see has some concerns about UTI.  Daughter stated that hme heaIth had a verbal conversation with provider and she instructed me to call [facility] and give verbal order for urine collection and culture and sensitivity.    I spoke with Georgina Snell at [facility] she took verbal order.  [place]

## 2022-06-30 NOTE — Progress Notes (Signed)
This encounter was created in error - please disregard.

## 2022-07-01 ENCOUNTER — Telehealth: Payer: Self-pay

## 2022-07-01 ENCOUNTER — Telehealth: Payer: Self-pay | Admitting: Family

## 2022-07-01 ENCOUNTER — Other Ambulatory Visit: Payer: Self-pay

## 2022-07-01 ENCOUNTER — Ambulatory Visit: Payer: Self-pay

## 2022-07-01 DIAGNOSIS — I509 Heart failure, unspecified: Secondary | ICD-10-CM | POA: Diagnosis not present

## 2022-07-01 NOTE — Telephone Encounter (Signed)
Patient's daughter called inquiring about FMLA paperwork. Paperwork is on Provider's (Advice worker, NP)desk.

## 2022-07-01 NOTE — Telephone Encounter (Signed)
FMLA paper work completed fax or POA to pick up as requested.

## 2022-07-01 NOTE — Progress Notes (Signed)
43- Spoke with Daughter [name] by telephone for Palliative Check in s/p hospitalization. Used two identifiers to verify pt identity.        Daughter reports pt had been hospitalized for nearly two weeks with discharge being 06/17/22. Reports that "they changed around her medications and got her straight". Reports that pt was discharged with hoyer lift to "move her from place to place, but she doesn't need that. She sits up for hours now and her blood pressure has been good. We are waiting on a sit to stand thing now that will help her get up which is when she really has a hard time."       Reports that [facility] is seeing pt. PT, OT, and RN comes out to see her right now. Reports that they "helped her a couple of days ago to get a urine sample for maybe a uti".      Daughter denies any other problems or complaints at this time. Verified that pt and daughter has [facility] contact info.

## 2022-07-01 NOTE — Patient Instructions (Signed)
Visit Information  Thank you for taking time to visit with me today. Please don't hesitate to contact me if I can be of assistance to you.   Following are the goals we discussed today:   Goals Addressed             This Visit's Progress    I would like to keep my mom out of the hospital       Care Coordination Interventions: Basic overview and discussion of pathophysiology of Heart Failure reviewed Reviewed Heart Failure Action Plan in depth and provided written copy Advised patient to discuss new symptoms or concerns with provider Assessed the POA daughter Eloyse Causey understanding of chronic kidney disease    Evaluation of current treatment plan related to chronic kidney disease self management and patient's adherence to plan as established by provider      Reviewed prescribed diet 1200 ml fluid restriction per Nephrologist Provided education on kidney disease progression    Mailed printed educational materials related to Eating Right with Kidney disease              Our next appointment is by telephone on 08/13/22 at 09:45 AM  Please call the care guide team at [phone] if you need to cancel or reschedule your appointment.   If you are experiencing a Mental Health or [place] or need someone to talk to, please call 1-800-273-TALK (toll free, 24 hour hotline)  Patient verbalizes understanding of instructions and care plan provided today and agrees to view in [place]. Active MyChart status and patient understanding of how to access instructions and care plan via MyChart confirmed with patient.     Barb Merino, RN, BSN, CCM Care Management Coordinator [facility] Care Management Direct Phone: [phone]

## 2022-07-01 NOTE — Patient Outreach (Signed)
  Care Coordination   Initial Visit Note   07/01/2022 Name: Kara Mullins MRN: [id number] DOB: [birth year]  Kara Mullins is a 71 y.o. year old female who sees [name], [name], NP for primary care. I spoke with daughter Kara Mullins by phone today.  What matters to the patients health and wellness today?  Patient's daughter would like to keep her out of the hospital.     Goals Addressed             This Visit's Progress    I would like to keep my mom out of the hospital       Care Coordination Interventions: Basic overview and discussion of pathophysiology of Heart Failure reviewed Reviewed Heart Failure Action Plan in depth and provided written copy Advised patient to discuss new symptoms or concerns with provider Assessed the POA daughter Kara Mullins understanding of chronic kidney disease    Evaluation of current treatment plan related to chronic kidney disease self management and patient's adherence to plan as established by provider      Reviewed prescribed diet 1200 ml fluid restriction per Nephrologist Provided education on kidney disease progression    Mailed printed educational materials related to Eating Right with Kidney disease        SDOH assessments and interventions completed:  Yes     Care Coordination Interventions Activated:  Yes  Care Coordination Interventions:  Yes, provided   Follow up plan: Follow up call scheduled for 08/13/22 @09 :45 AM     Encounter Outcome:  Pt. Visit Completed

## 2022-07-02 NOTE — Telephone Encounter (Signed)
Called daughter to see if she wants form to be faxed or will pick up and that there is a $25.00 fee for form.

## 2022-07-03 ENCOUNTER — Telehealth: Payer: Self-pay | Admitting: Family

## 2022-07-03 NOTE — Telephone Encounter (Signed)
Fax received for U/A and C/S results negative for urinary tract infection.

## 2022-07-04 DIAGNOSIS — E8809 Other disorders of plasma-protein metabolism, not elsewhere classified: Secondary | ICD-10-CM | POA: Diagnosis not present

## 2022-07-04 DIAGNOSIS — E785 Hyperlipidemia, unspecified: Secondary | ICD-10-CM | POA: Diagnosis not present

## 2022-07-04 DIAGNOSIS — J15212 Pneumonia due to Methicillin resistant Staphylococcus aureus: Secondary | ICD-10-CM | POA: Diagnosis not present

## 2022-07-04 DIAGNOSIS — R5381 Other malaise: Secondary | ICD-10-CM | POA: Diagnosis not present

## 2022-07-04 DIAGNOSIS — R7401 Elevation of levels of liver transaminase levels: Secondary | ICD-10-CM | POA: Diagnosis not present

## 2022-07-04 DIAGNOSIS — E02 Subclinical iodine-deficiency hypothyroidism: Secondary | ICD-10-CM | POA: Diagnosis not present

## 2022-07-04 DIAGNOSIS — I480 Paroxysmal atrial fibrillation: Secondary | ICD-10-CM | POA: Diagnosis not present

## 2022-07-04 DIAGNOSIS — I130 Hypertensive heart and chronic kidney disease with heart failure and stage 1 through stage 4 chronic kidney disease, or unspecified chronic kidney disease: Secondary | ICD-10-CM | POA: Diagnosis not present

## 2022-07-04 DIAGNOSIS — F411 Generalized anxiety disorder: Secondary | ICD-10-CM | POA: Diagnosis not present

## 2022-07-04 DIAGNOSIS — I440 Atrioventricular block, first degree: Secondary | ICD-10-CM | POA: Diagnosis not present

## 2022-07-04 DIAGNOSIS — D631 Anemia in chronic kidney disease: Secondary | ICD-10-CM | POA: Diagnosis not present

## 2022-07-04 DIAGNOSIS — Z9181 History of falling: Secondary | ICD-10-CM | POA: Diagnosis not present

## 2022-07-04 NOTE — Telephone Encounter (Signed)
Patient daughter notified and agreed.  

## 2022-07-05 DIAGNOSIS — I5042 Chronic combined systolic (congestive) and diastolic (congestive) heart failure: Secondary | ICD-10-CM | POA: Diagnosis not present

## 2022-07-05 DIAGNOSIS — N1832 Chronic kidney disease, stage 3b: Secondary | ICD-10-CM | POA: Diagnosis not present

## 2022-07-05 DIAGNOSIS — J15212 Pneumonia due to Methicillin resistant Staphylococcus aureus: Secondary | ICD-10-CM | POA: Diagnosis not present

## 2022-07-05 DIAGNOSIS — I440 Atrioventricular block, first degree: Secondary | ICD-10-CM | POA: Diagnosis not present

## 2022-07-05 DIAGNOSIS — I130 Hypertensive heart and chronic kidney disease with heart failure and stage 1 through stage 4 chronic kidney disease, or unspecified chronic kidney disease: Secondary | ICD-10-CM | POA: Diagnosis not present

## 2022-07-05 DIAGNOSIS — D631 Anemia in chronic kidney disease: Secondary | ICD-10-CM | POA: Diagnosis not present

## 2022-07-05 DIAGNOSIS — Z9181 History of falling: Secondary | ICD-10-CM | POA: Diagnosis not present

## 2022-07-05 DIAGNOSIS — U071 COVID-19: Secondary | ICD-10-CM | POA: Diagnosis not present

## 2022-07-05 DIAGNOSIS — R5381 Other malaise: Secondary | ICD-10-CM | POA: Diagnosis not present

## 2022-07-05 DIAGNOSIS — E8809 Other disorders of plasma-protein metabolism, not elsewhere classified: Secondary | ICD-10-CM | POA: Diagnosis not present

## 2022-07-05 DIAGNOSIS — R531 Weakness: Secondary | ICD-10-CM | POA: Diagnosis not present

## 2022-07-05 DIAGNOSIS — A419 Sepsis, unspecified organism: Secondary | ICD-10-CM | POA: Diagnosis not present

## 2022-07-05 DIAGNOSIS — N185 Chronic kidney disease, stage 5: Secondary | ICD-10-CM | POA: Diagnosis not present

## 2022-07-05 DIAGNOSIS — E785 Hyperlipidemia, unspecified: Secondary | ICD-10-CM | POA: Diagnosis not present

## 2022-07-05 DIAGNOSIS — E02 Subclinical iodine-deficiency hypothyroidism: Secondary | ICD-10-CM | POA: Diagnosis not present

## 2022-07-05 DIAGNOSIS — F411 Generalized anxiety disorder: Secondary | ICD-10-CM | POA: Diagnosis not present

## 2022-07-05 DIAGNOSIS — R7401 Elevation of levels of liver transaminase levels: Secondary | ICD-10-CM | POA: Diagnosis not present

## 2022-07-05 DIAGNOSIS — I480 Paroxysmal atrial fibrillation: Secondary | ICD-10-CM | POA: Diagnosis not present

## 2022-07-08 DIAGNOSIS — E785 Hyperlipidemia, unspecified: Secondary | ICD-10-CM | POA: Diagnosis not present

## 2022-07-08 DIAGNOSIS — Z9181 History of falling: Secondary | ICD-10-CM | POA: Diagnosis not present

## 2022-07-08 DIAGNOSIS — E8809 Other disorders of plasma-protein metabolism, not elsewhere classified: Secondary | ICD-10-CM | POA: Diagnosis not present

## 2022-07-08 DIAGNOSIS — I440 Atrioventricular block, first degree: Secondary | ICD-10-CM | POA: Diagnosis not present

## 2022-07-08 DIAGNOSIS — R7401 Elevation of levels of liver transaminase levels: Secondary | ICD-10-CM | POA: Diagnosis not present

## 2022-07-08 DIAGNOSIS — I130 Hypertensive heart and chronic kidney disease with heart failure and stage 1 through stage 4 chronic kidney disease, or unspecified chronic kidney disease: Secondary | ICD-10-CM | POA: Diagnosis not present

## 2022-07-08 DIAGNOSIS — F411 Generalized anxiety disorder: Secondary | ICD-10-CM | POA: Diagnosis not present

## 2022-07-08 DIAGNOSIS — I480 Paroxysmal atrial fibrillation: Secondary | ICD-10-CM | POA: Diagnosis not present

## 2022-07-08 DIAGNOSIS — R5381 Other malaise: Secondary | ICD-10-CM | POA: Diagnosis not present

## 2022-07-08 DIAGNOSIS — D631 Anemia in chronic kidney disease: Secondary | ICD-10-CM | POA: Diagnosis not present

## 2022-07-08 DIAGNOSIS — J15212 Pneumonia due to Methicillin resistant Staphylococcus aureus: Secondary | ICD-10-CM | POA: Diagnosis not present

## 2022-07-08 DIAGNOSIS — E02 Subclinical iodine-deficiency hypothyroidism: Secondary | ICD-10-CM | POA: Diagnosis not present

## 2022-07-10 DIAGNOSIS — N179 Acute kidney failure, unspecified: Secondary | ICD-10-CM | POA: Diagnosis not present

## 2022-07-10 DIAGNOSIS — D631 Anemia in chronic kidney disease: Secondary | ICD-10-CM | POA: Diagnosis not present

## 2022-07-10 DIAGNOSIS — R768 Other specified abnormal immunological findings in serum: Secondary | ICD-10-CM | POA: Diagnosis not present

## 2022-07-10 DIAGNOSIS — I480 Paroxysmal atrial fibrillation: Secondary | ICD-10-CM | POA: Diagnosis not present

## 2022-07-10 DIAGNOSIS — N184 Chronic kidney disease, stage 4 (severe): Secondary | ICD-10-CM | POA: Diagnosis not present

## 2022-07-10 DIAGNOSIS — I129 Hypertensive chronic kidney disease with stage 1 through stage 4 chronic kidney disease, or unspecified chronic kidney disease: Secondary | ICD-10-CM | POA: Diagnosis not present

## 2022-07-10 DIAGNOSIS — I5032 Chronic diastolic (congestive) heart failure: Secondary | ICD-10-CM | POA: Diagnosis not present

## 2022-07-11 DIAGNOSIS — F411 Generalized anxiety disorder: Secondary | ICD-10-CM | POA: Diagnosis not present

## 2022-07-11 DIAGNOSIS — I130 Hypertensive heart and chronic kidney disease with heart failure and stage 1 through stage 4 chronic kidney disease, or unspecified chronic kidney disease: Secondary | ICD-10-CM | POA: Diagnosis not present

## 2022-07-11 DIAGNOSIS — D631 Anemia in chronic kidney disease: Secondary | ICD-10-CM | POA: Diagnosis not present

## 2022-07-11 DIAGNOSIS — J15212 Pneumonia due to Methicillin resistant Staphylococcus aureus: Secondary | ICD-10-CM | POA: Diagnosis not present

## 2022-07-11 DIAGNOSIS — E8809 Other disorders of plasma-protein metabolism, not elsewhere classified: Secondary | ICD-10-CM | POA: Diagnosis not present

## 2022-07-11 DIAGNOSIS — R5381 Other malaise: Secondary | ICD-10-CM | POA: Diagnosis not present

## 2022-07-11 DIAGNOSIS — E785 Hyperlipidemia, unspecified: Secondary | ICD-10-CM | POA: Diagnosis not present

## 2022-07-11 DIAGNOSIS — Z9181 History of falling: Secondary | ICD-10-CM | POA: Diagnosis not present

## 2022-07-11 DIAGNOSIS — E02 Subclinical iodine-deficiency hypothyroidism: Secondary | ICD-10-CM | POA: Diagnosis not present

## 2022-07-11 DIAGNOSIS — R7401 Elevation of levels of liver transaminase levels: Secondary | ICD-10-CM | POA: Diagnosis not present

## 2022-07-11 DIAGNOSIS — I440 Atrioventricular block, first degree: Secondary | ICD-10-CM | POA: Diagnosis not present

## 2022-07-11 DIAGNOSIS — I480 Paroxysmal atrial fibrillation: Secondary | ICD-10-CM | POA: Diagnosis not present

## 2022-07-12 ENCOUNTER — Other Ambulatory Visit: Payer: Self-pay | Admitting: Internal Medicine

## 2022-07-14 ENCOUNTER — Other Ambulatory Visit: Payer: Self-pay | Admitting: Cardiology

## 2022-07-17 DIAGNOSIS — A419 Sepsis, unspecified organism: Secondary | ICD-10-CM | POA: Diagnosis not present

## 2022-07-17 DIAGNOSIS — I480 Paroxysmal atrial fibrillation: Secondary | ICD-10-CM | POA: Diagnosis not present

## 2022-07-17 DIAGNOSIS — I5042 Chronic combined systolic (congestive) and diastolic (congestive) heart failure: Secondary | ICD-10-CM | POA: Diagnosis not present

## 2022-07-17 DIAGNOSIS — Z9181 History of falling: Secondary | ICD-10-CM | POA: Diagnosis not present

## 2022-07-17 DIAGNOSIS — F411 Generalized anxiety disorder: Secondary | ICD-10-CM | POA: Diagnosis not present

## 2022-07-17 DIAGNOSIS — R7401 Elevation of levels of liver transaminase levels: Secondary | ICD-10-CM | POA: Diagnosis not present

## 2022-07-17 DIAGNOSIS — E785 Hyperlipidemia, unspecified: Secondary | ICD-10-CM | POA: Diagnosis not present

## 2022-07-17 DIAGNOSIS — N185 Chronic kidney disease, stage 5: Secondary | ICD-10-CM | POA: Diagnosis not present

## 2022-07-17 DIAGNOSIS — N1832 Chronic kidney disease, stage 3b: Secondary | ICD-10-CM | POA: Diagnosis not present

## 2022-07-17 DIAGNOSIS — E8809 Other disorders of plasma-protein metabolism, not elsewhere classified: Secondary | ICD-10-CM | POA: Diagnosis not present

## 2022-07-17 DIAGNOSIS — I440 Atrioventricular block, first degree: Secondary | ICD-10-CM | POA: Diagnosis not present

## 2022-07-17 DIAGNOSIS — E02 Subclinical iodine-deficiency hypothyroidism: Secondary | ICD-10-CM | POA: Diagnosis not present

## 2022-07-17 DIAGNOSIS — D631 Anemia in chronic kidney disease: Secondary | ICD-10-CM | POA: Diagnosis not present

## 2022-07-17 DIAGNOSIS — R5381 Other malaise: Secondary | ICD-10-CM | POA: Diagnosis not present

## 2022-07-17 DIAGNOSIS — U071 COVID-19: Secondary | ICD-10-CM | POA: Diagnosis not present

## 2022-07-17 DIAGNOSIS — I130 Hypertensive heart and chronic kidney disease with heart failure and stage 1 through stage 4 chronic kidney disease, or unspecified chronic kidney disease: Secondary | ICD-10-CM | POA: Diagnosis not present

## 2022-07-17 DIAGNOSIS — R531 Weakness: Secondary | ICD-10-CM | POA: Diagnosis not present

## 2022-07-17 DIAGNOSIS — J15212 Pneumonia due to Methicillin resistant Staphylococcus aureus: Secondary | ICD-10-CM | POA: Diagnosis not present

## 2022-07-18 DIAGNOSIS — N185 Chronic kidney disease, stage 5: Secondary | ICD-10-CM | POA: Diagnosis not present

## 2022-07-18 DIAGNOSIS — E8809 Other disorders of plasma-protein metabolism, not elsewhere classified: Secondary | ICD-10-CM | POA: Diagnosis not present

## 2022-07-18 DIAGNOSIS — J15212 Pneumonia due to Methicillin resistant Staphylococcus aureus: Secondary | ICD-10-CM | POA: Diagnosis not present

## 2022-07-18 DIAGNOSIS — I480 Paroxysmal atrial fibrillation: Secondary | ICD-10-CM | POA: Diagnosis not present

## 2022-07-18 DIAGNOSIS — Z9181 History of falling: Secondary | ICD-10-CM | POA: Diagnosis not present

## 2022-07-18 DIAGNOSIS — I5042 Chronic combined systolic (congestive) and diastolic (congestive) heart failure: Secondary | ICD-10-CM | POA: Diagnosis not present

## 2022-07-18 DIAGNOSIS — D631 Anemia in chronic kidney disease: Secondary | ICD-10-CM | POA: Diagnosis not present

## 2022-07-18 DIAGNOSIS — E02 Subclinical iodine-deficiency hypothyroidism: Secondary | ICD-10-CM | POA: Diagnosis not present

## 2022-07-18 DIAGNOSIS — I440 Atrioventricular block, first degree: Secondary | ICD-10-CM | POA: Diagnosis not present

## 2022-07-18 DIAGNOSIS — I130 Hypertensive heart and chronic kidney disease with heart failure and stage 1 through stage 4 chronic kidney disease, or unspecified chronic kidney disease: Secondary | ICD-10-CM | POA: Diagnosis not present

## 2022-07-18 DIAGNOSIS — N1832 Chronic kidney disease, stage 3b: Secondary | ICD-10-CM | POA: Diagnosis not present

## 2022-07-18 DIAGNOSIS — A419 Sepsis, unspecified organism: Secondary | ICD-10-CM | POA: Diagnosis not present

## 2022-07-18 DIAGNOSIS — E785 Hyperlipidemia, unspecified: Secondary | ICD-10-CM | POA: Diagnosis not present

## 2022-07-18 DIAGNOSIS — F411 Generalized anxiety disorder: Secondary | ICD-10-CM | POA: Diagnosis not present

## 2022-07-18 DIAGNOSIS — R5381 Other malaise: Secondary | ICD-10-CM | POA: Diagnosis not present

## 2022-07-18 DIAGNOSIS — R7401 Elevation of levels of liver transaminase levels: Secondary | ICD-10-CM | POA: Diagnosis not present

## 2022-07-18 DIAGNOSIS — R531 Weakness: Secondary | ICD-10-CM | POA: Diagnosis not present

## 2022-07-18 DIAGNOSIS — U071 COVID-19: Secondary | ICD-10-CM | POA: Diagnosis not present

## 2022-07-19 ENCOUNTER — Other Ambulatory Visit: Payer: Self-pay | Admitting: Internal Medicine

## 2022-07-19 ENCOUNTER — Ambulatory Visit (HOSPITAL_COMMUNITY)
Admission: RE | Admit: 2022-07-19 | Discharge: 2022-07-19 | Disposition: A | Discharge status: Home / Self Care | Payer: Medicare (Managed Care) | Source: Ambulatory Visit | Attending: Nephrology | Admitting: Nephrology

## 2022-07-19 VITALS — BP 180/84 | HR 72 | Temp 97.8°F | Resp 18

## 2022-07-19 DIAGNOSIS — N185 Chronic kidney disease, stage 5: Secondary | ICD-10-CM | POA: Insufficient documentation

## 2022-07-19 LAB — POCT HEMOGLOBIN-HEMACUE: Hemoglobin: 9.8 g/dL — ABNORMAL LOW (ref 12.0–15.0)

## 2022-07-19 MED ORDER — EPOETIN ALFA-EPBX 10000 UNIT/ML IJ SOLN
INTRAMUSCULAR | Status: AC
  Filled 2022-07-19: qty 1

## 2022-07-19 MED ORDER — EPOETIN ALFA-EPBX 10000 UNIT/ML IJ SOLN
10000.0000 [IU] | INTRAMUSCULAR | Status: DC
  Administered 2022-07-19: 10000 [IU] via SUBCUTANEOUS

## 2022-07-21 DIAGNOSIS — I440 Atrioventricular block, first degree: Secondary | ICD-10-CM | POA: Diagnosis not present

## 2022-07-21 DIAGNOSIS — Z9181 History of falling: Secondary | ICD-10-CM | POA: Diagnosis not present

## 2022-07-21 DIAGNOSIS — E8809 Other disorders of plasma-protein metabolism, not elsewhere classified: Secondary | ICD-10-CM | POA: Diagnosis not present

## 2022-07-21 DIAGNOSIS — R5381 Other malaise: Secondary | ICD-10-CM | POA: Diagnosis not present

## 2022-07-21 DIAGNOSIS — I130 Hypertensive heart and chronic kidney disease with heart failure and stage 1 through stage 4 chronic kidney disease, or unspecified chronic kidney disease: Secondary | ICD-10-CM | POA: Diagnosis not present

## 2022-07-21 DIAGNOSIS — D631 Anemia in chronic kidney disease: Secondary | ICD-10-CM | POA: Diagnosis not present

## 2022-07-21 DIAGNOSIS — F411 Generalized anxiety disorder: Secondary | ICD-10-CM | POA: Diagnosis not present

## 2022-07-21 DIAGNOSIS — E785 Hyperlipidemia, unspecified: Secondary | ICD-10-CM | POA: Diagnosis not present

## 2022-07-21 DIAGNOSIS — R7401 Elevation of levels of liver transaminase levels: Secondary | ICD-10-CM | POA: Diagnosis not present

## 2022-07-21 DIAGNOSIS — J15212 Pneumonia due to Methicillin resistant Staphylococcus aureus: Secondary | ICD-10-CM | POA: Diagnosis not present

## 2022-07-21 DIAGNOSIS — I480 Paroxysmal atrial fibrillation: Secondary | ICD-10-CM | POA: Diagnosis not present

## 2022-07-21 DIAGNOSIS — E02 Subclinical iodine-deficiency hypothyroidism: Secondary | ICD-10-CM | POA: Diagnosis not present

## 2022-07-22 ENCOUNTER — Telehealth: Payer: Self-pay

## 2022-07-22 ENCOUNTER — Other Ambulatory Visit: Payer: Self-pay

## 2022-07-22 NOTE — Telephone Encounter (Signed)
Vitamin D prescribed by Nephrologist ?

## 2022-07-22 NOTE — Telephone Encounter (Signed)
Patient's daughter called stating that she thinks Vitamin D may be too strong. She states that every time the patient takes it she has diarrhea for 12 hours. Please advise.  Message routed to Marlowe Sax, NP

## 2022-07-23 ENCOUNTER — Telehealth: Payer: Self-pay

## 2022-07-23 ENCOUNTER — Other Ambulatory Visit: Payer: Self-pay

## 2022-07-23 DIAGNOSIS — R2681 Unsteadiness on feet: Secondary | ICD-10-CM

## 2022-07-23 NOTE — Telephone Encounter (Signed)
Sit and stand lift script ordered.please mail to patient home as request.

## 2022-07-23 NOTE — Telephone Encounter (Signed)
Order mailed to patient home address.

## 2022-07-23 NOTE — Telephone Encounter (Signed)
I was discussing medication with daughter, [name] and she stated that patinet needed a sit to stand. Patient was given order for hoyer lift when she left the hospital,but she really needs a sit to stand. She would like order mailed to home address.  Message routed to Marlowe Sax, NP

## 2022-07-24 ENCOUNTER — Other Ambulatory Visit: Payer: Self-pay

## 2022-07-24 ENCOUNTER — Other Ambulatory Visit: Payer: Self-pay | Admitting: Family

## 2022-07-24 ENCOUNTER — Other Ambulatory Visit: Payer: Self-pay | Admitting: Adult Health

## 2022-07-24 DIAGNOSIS — I5041 Acute combined systolic (congestive) and diastolic (congestive) heart failure: Secondary | ICD-10-CM

## 2022-07-24 MED ORDER — NEPHRO-VITE 0.8 MG PO TABS: 1.0000 | ORAL_TABLET | Freq: Every day | ORAL | 1 refills | Status: DC

## 2022-07-24 MED ORDER — FERROUS SULFATE 325 (65 FE) MG PO TABS: 325.0000 mg | ORAL_TABLET | Freq: Every day | ORAL | 1 refills | Status: DC

## 2022-07-24 MED ORDER — HYDRALAZINE HCL 50 MG PO TABS: 50.0000 mg | ORAL_TABLET | Freq: Three times a day (TID) | ORAL | 0 refills | Status: DC

## 2022-07-25 ENCOUNTER — Ambulatory Visit: Payer: Self-pay

## 2022-07-25 NOTE — Patient Instructions (Signed)
Visit Information  Thank you for taking time to visit with me today. Please don't hesitate to contact me if I can be of assistance to you.   Following are the goals we discussed today:   Goals Addressed             This Visit's Progress    COMPLETED: Care Coordination Activities       Care Coordination Interventions: Discussed the patient is doing well at this time; patient did receive information in the mail on the in home aid program Performed chart review to note patients provider is mailing a prescription for a sit to stand to patients home [place] she can contact [place] to arrange pick up of the hoyer lift if it is no longer needed Encouraged [name] to contact SW as needed        If you are experiencing a Mental Health or [place] or need someone to talk to, please call 1-800-273-TALK (toll free, 24 hour hotline)  Patient verbalizes understanding of instructions and care plan provided today and agrees to view in [place]. Active MyChart status and patient understanding of how to access instructions and care plan via MyChart confirmed with patient.     No further follow up required: Please contact me as needed  Daneen Schick, BSW, CDP Social Worker, Certified Dementia Practitioner [place] [phone]

## 2022-07-25 NOTE — Telephone Encounter (Signed)
FYI patient has been seen several times via video visit. Last time a provider spoke with her they didn't ask for a return visit. Looks like she's due for an in person visit in Jan.of 2024.

## 2022-07-25 NOTE — Patient Outreach (Signed)
  Care Coordination   Follow Up Visit Note   07/25/2022 Name: Kara Mullins MRN: [id number] DOB: 10/27/1950  Kara Mullins is a 71 y.o. year old female who sees [name], [name], NP for primary care. I  spoke with patients daughter and caregiver [name] by phone  What matters to the patients health and wellness today?  To obtain a sit to stand lift    Goals Addressed             This Visit's Progress    COMPLETED: Care Coordination Activities       Care Coordination Interventions: Discussed the patient is doing well at this time; patient did receive information in the mail on the in home aid program Performed chart review to note patients provider is mailing a prescription for a sit to stand to patients home [place] she can contact [place] to arrange pick up of the hoyer lift if it is no longer needed Encouraged [place] to contact SW as needed        SDOH assessments and interventions completed:  No     Care Coordination Interventions Activated:  Yes  Care Coordination Interventions:  Yes, provided   Follow up plan: No further intervention required. RN Care Manager Glenard Haring [place] will continue to follow patient.  Encounter Outcome:  Pt. Visit Completed   Daneen Schick, BSW, CDP Social Worker, Certified Dementia Practitioner [place] [facility]  Care Coordination [phone]

## 2022-07-26 ENCOUNTER — Other Ambulatory Visit: Payer: Self-pay | Admitting: Family

## 2022-07-26 DIAGNOSIS — E8809 Other disorders of plasma-protein metabolism, not elsewhere classified: Secondary | ICD-10-CM | POA: Diagnosis not present

## 2022-07-26 DIAGNOSIS — R5381 Other malaise: Secondary | ICD-10-CM | POA: Diagnosis not present

## 2022-07-26 DIAGNOSIS — E785 Hyperlipidemia, unspecified: Secondary | ICD-10-CM | POA: Diagnosis not present

## 2022-07-26 DIAGNOSIS — J15212 Pneumonia due to Methicillin resistant Staphylococcus aureus: Secondary | ICD-10-CM | POA: Diagnosis not present

## 2022-07-26 DIAGNOSIS — I130 Hypertensive heart and chronic kidney disease with heart failure and stage 1 through stage 4 chronic kidney disease, or unspecified chronic kidney disease: Secondary | ICD-10-CM | POA: Diagnosis not present

## 2022-07-26 DIAGNOSIS — F411 Generalized anxiety disorder: Secondary | ICD-10-CM | POA: Diagnosis not present

## 2022-07-26 DIAGNOSIS — I480 Paroxysmal atrial fibrillation: Secondary | ICD-10-CM | POA: Diagnosis not present

## 2022-07-26 DIAGNOSIS — R7401 Elevation of levels of liver transaminase levels: Secondary | ICD-10-CM | POA: Diagnosis not present

## 2022-07-26 DIAGNOSIS — E02 Subclinical iodine-deficiency hypothyroidism: Secondary | ICD-10-CM | POA: Diagnosis not present

## 2022-07-26 DIAGNOSIS — Z9181 History of falling: Secondary | ICD-10-CM | POA: Diagnosis not present

## 2022-07-26 DIAGNOSIS — D631 Anemia in chronic kidney disease: Secondary | ICD-10-CM | POA: Diagnosis not present

## 2022-07-26 DIAGNOSIS — I440 Atrioventricular block, first degree: Secondary | ICD-10-CM | POA: Diagnosis not present

## 2022-07-30 DIAGNOSIS — I130 Hypertensive heart and chronic kidney disease with heart failure and stage 1 through stage 4 chronic kidney disease, or unspecified chronic kidney disease: Secondary | ICD-10-CM | POA: Diagnosis not present

## 2022-07-30 DIAGNOSIS — E02 Subclinical iodine-deficiency hypothyroidism: Secondary | ICD-10-CM | POA: Diagnosis not present

## 2022-07-30 DIAGNOSIS — D631 Anemia in chronic kidney disease: Secondary | ICD-10-CM | POA: Diagnosis not present

## 2022-07-30 DIAGNOSIS — R5381 Other malaise: Secondary | ICD-10-CM | POA: Diagnosis not present

## 2022-07-30 DIAGNOSIS — I440 Atrioventricular block, first degree: Secondary | ICD-10-CM | POA: Diagnosis not present

## 2022-07-30 DIAGNOSIS — F411 Generalized anxiety disorder: Secondary | ICD-10-CM | POA: Diagnosis not present

## 2022-07-30 DIAGNOSIS — I480 Paroxysmal atrial fibrillation: Secondary | ICD-10-CM | POA: Diagnosis not present

## 2022-07-30 DIAGNOSIS — Z9181 History of falling: Secondary | ICD-10-CM | POA: Diagnosis not present

## 2022-07-30 DIAGNOSIS — E785 Hyperlipidemia, unspecified: Secondary | ICD-10-CM | POA: Diagnosis not present

## 2022-07-30 DIAGNOSIS — E8809 Other disorders of plasma-protein metabolism, not elsewhere classified: Secondary | ICD-10-CM | POA: Diagnosis not present

## 2022-07-30 DIAGNOSIS — R7401 Elevation of levels of liver transaminase levels: Secondary | ICD-10-CM | POA: Diagnosis not present

## 2022-07-30 DIAGNOSIS — J15212 Pneumonia due to Methicillin resistant Staphylococcus aureus: Secondary | ICD-10-CM | POA: Diagnosis not present

## 2022-07-31 DIAGNOSIS — E785 Hyperlipidemia, unspecified: Secondary | ICD-10-CM | POA: Diagnosis not present

## 2022-07-31 DIAGNOSIS — Z9181 History of falling: Secondary | ICD-10-CM | POA: Diagnosis not present

## 2022-07-31 DIAGNOSIS — I480 Paroxysmal atrial fibrillation: Secondary | ICD-10-CM | POA: Diagnosis not present

## 2022-07-31 DIAGNOSIS — I440 Atrioventricular block, first degree: Secondary | ICD-10-CM | POA: Diagnosis not present

## 2022-07-31 DIAGNOSIS — E8809 Other disorders of plasma-protein metabolism, not elsewhere classified: Secondary | ICD-10-CM | POA: Diagnosis not present

## 2022-07-31 DIAGNOSIS — I130 Hypertensive heart and chronic kidney disease with heart failure and stage 1 through stage 4 chronic kidney disease, or unspecified chronic kidney disease: Secondary | ICD-10-CM | POA: Diagnosis not present

## 2022-07-31 DIAGNOSIS — J15212 Pneumonia due to Methicillin resistant Staphylococcus aureus: Secondary | ICD-10-CM | POA: Diagnosis not present

## 2022-07-31 DIAGNOSIS — F411 Generalized anxiety disorder: Secondary | ICD-10-CM | POA: Diagnosis not present

## 2022-07-31 DIAGNOSIS — R5381 Other malaise: Secondary | ICD-10-CM | POA: Diagnosis not present

## 2022-07-31 DIAGNOSIS — R7401 Elevation of levels of liver transaminase levels: Secondary | ICD-10-CM | POA: Diagnosis not present

## 2022-07-31 DIAGNOSIS — D631 Anemia in chronic kidney disease: Secondary | ICD-10-CM | POA: Diagnosis not present

## 2022-07-31 DIAGNOSIS — E02 Subclinical iodine-deficiency hypothyroidism: Secondary | ICD-10-CM | POA: Diagnosis not present

## 2022-08-01 DIAGNOSIS — J15212 Pneumonia due to Methicillin resistant Staphylococcus aureus: Secondary | ICD-10-CM | POA: Diagnosis not present

## 2022-08-01 DIAGNOSIS — E8809 Other disorders of plasma-protein metabolism, not elsewhere classified: Secondary | ICD-10-CM | POA: Diagnosis not present

## 2022-08-01 DIAGNOSIS — E02 Subclinical iodine-deficiency hypothyroidism: Secondary | ICD-10-CM | POA: Diagnosis not present

## 2022-08-01 DIAGNOSIS — Z9181 History of falling: Secondary | ICD-10-CM | POA: Diagnosis not present

## 2022-08-01 DIAGNOSIS — I440 Atrioventricular block, first degree: Secondary | ICD-10-CM | POA: Diagnosis not present

## 2022-08-01 DIAGNOSIS — I480 Paroxysmal atrial fibrillation: Secondary | ICD-10-CM | POA: Diagnosis not present

## 2022-08-01 DIAGNOSIS — E785 Hyperlipidemia, unspecified: Secondary | ICD-10-CM | POA: Diagnosis not present

## 2022-08-01 DIAGNOSIS — I130 Hypertensive heart and chronic kidney disease with heart failure and stage 1 through stage 4 chronic kidney disease, or unspecified chronic kidney disease: Secondary | ICD-10-CM | POA: Diagnosis not present

## 2022-08-01 DIAGNOSIS — R7401 Elevation of levels of liver transaminase levels: Secondary | ICD-10-CM | POA: Diagnosis not present

## 2022-08-01 DIAGNOSIS — F411 Generalized anxiety disorder: Secondary | ICD-10-CM | POA: Diagnosis not present

## 2022-08-01 DIAGNOSIS — D631 Anemia in chronic kidney disease: Secondary | ICD-10-CM | POA: Diagnosis not present

## 2022-08-01 DIAGNOSIS — R5381 Other malaise: Secondary | ICD-10-CM | POA: Diagnosis not present

## 2022-08-04 DIAGNOSIS — U071 COVID-19: Secondary | ICD-10-CM | POA: Diagnosis not present

## 2022-08-04 DIAGNOSIS — N185 Chronic kidney disease, stage 5: Secondary | ICD-10-CM | POA: Diagnosis not present

## 2022-08-04 DIAGNOSIS — I5042 Chronic combined systolic (congestive) and diastolic (congestive) heart failure: Secondary | ICD-10-CM | POA: Diagnosis not present

## 2022-08-04 DIAGNOSIS — N1832 Chronic kidney disease, stage 3b: Secondary | ICD-10-CM | POA: Diagnosis not present

## 2022-08-04 DIAGNOSIS — A419 Sepsis, unspecified organism: Secondary | ICD-10-CM | POA: Diagnosis not present

## 2022-08-04 DIAGNOSIS — R531 Weakness: Secondary | ICD-10-CM | POA: Diagnosis not present

## 2022-08-08 DIAGNOSIS — D631 Anemia in chronic kidney disease: Secondary | ICD-10-CM | POA: Diagnosis not present

## 2022-08-08 DIAGNOSIS — I440 Atrioventricular block, first degree: Secondary | ICD-10-CM | POA: Diagnosis not present

## 2022-08-08 DIAGNOSIS — R7401 Elevation of levels of liver transaminase levels: Secondary | ICD-10-CM | POA: Diagnosis not present

## 2022-08-08 DIAGNOSIS — I480 Paroxysmal atrial fibrillation: Secondary | ICD-10-CM | POA: Diagnosis not present

## 2022-08-08 DIAGNOSIS — E8809 Other disorders of plasma-protein metabolism, not elsewhere classified: Secondary | ICD-10-CM | POA: Diagnosis not present

## 2022-08-08 DIAGNOSIS — F411 Generalized anxiety disorder: Secondary | ICD-10-CM | POA: Diagnosis not present

## 2022-08-08 DIAGNOSIS — I130 Hypertensive heart and chronic kidney disease with heart failure and stage 1 through stage 4 chronic kidney disease, or unspecified chronic kidney disease: Secondary | ICD-10-CM | POA: Diagnosis not present

## 2022-08-08 DIAGNOSIS — Z9181 History of falling: Secondary | ICD-10-CM | POA: Diagnosis not present

## 2022-08-08 DIAGNOSIS — J15212 Pneumonia due to Methicillin resistant Staphylococcus aureus: Secondary | ICD-10-CM | POA: Diagnosis not present

## 2022-08-08 DIAGNOSIS — E02 Subclinical iodine-deficiency hypothyroidism: Secondary | ICD-10-CM | POA: Diagnosis not present

## 2022-08-08 DIAGNOSIS — R5381 Other malaise: Secondary | ICD-10-CM | POA: Diagnosis not present

## 2022-08-08 DIAGNOSIS — E785 Hyperlipidemia, unspecified: Secondary | ICD-10-CM | POA: Diagnosis not present

## 2022-08-09 DIAGNOSIS — I130 Hypertensive heart and chronic kidney disease with heart failure and stage 1 through stage 4 chronic kidney disease, or unspecified chronic kidney disease: Secondary | ICD-10-CM | POA: Diagnosis not present

## 2022-08-09 DIAGNOSIS — R7401 Elevation of levels of liver transaminase levels: Secondary | ICD-10-CM | POA: Diagnosis not present

## 2022-08-09 DIAGNOSIS — Z9181 History of falling: Secondary | ICD-10-CM | POA: Diagnosis not present

## 2022-08-09 DIAGNOSIS — E8809 Other disorders of plasma-protein metabolism, not elsewhere classified: Secondary | ICD-10-CM | POA: Diagnosis not present

## 2022-08-09 DIAGNOSIS — J15212 Pneumonia due to Methicillin resistant Staphylococcus aureus: Secondary | ICD-10-CM | POA: Diagnosis not present

## 2022-08-09 DIAGNOSIS — D631 Anemia in chronic kidney disease: Secondary | ICD-10-CM | POA: Diagnosis not present

## 2022-08-09 DIAGNOSIS — E785 Hyperlipidemia, unspecified: Secondary | ICD-10-CM | POA: Diagnosis not present

## 2022-08-09 DIAGNOSIS — F411 Generalized anxiety disorder: Secondary | ICD-10-CM | POA: Diagnosis not present

## 2022-08-09 DIAGNOSIS — E02 Subclinical iodine-deficiency hypothyroidism: Secondary | ICD-10-CM | POA: Diagnosis not present

## 2022-08-09 DIAGNOSIS — R5381 Other malaise: Secondary | ICD-10-CM | POA: Diagnosis not present

## 2022-08-09 DIAGNOSIS — I480 Paroxysmal atrial fibrillation: Secondary | ICD-10-CM | POA: Diagnosis not present

## 2022-08-09 DIAGNOSIS — I440 Atrioventricular block, first degree: Secondary | ICD-10-CM | POA: Diagnosis not present

## 2022-08-12 DIAGNOSIS — R5381 Other malaise: Secondary | ICD-10-CM | POA: Diagnosis not present

## 2022-08-12 DIAGNOSIS — D631 Anemia in chronic kidney disease: Secondary | ICD-10-CM | POA: Diagnosis not present

## 2022-08-12 DIAGNOSIS — E02 Subclinical iodine-deficiency hypothyroidism: Secondary | ICD-10-CM | POA: Diagnosis not present

## 2022-08-12 DIAGNOSIS — Z9181 History of falling: Secondary | ICD-10-CM | POA: Diagnosis not present

## 2022-08-12 DIAGNOSIS — I130 Hypertensive heart and chronic kidney disease with heart failure and stage 1 through stage 4 chronic kidney disease, or unspecified chronic kidney disease: Secondary | ICD-10-CM | POA: Diagnosis not present

## 2022-08-12 DIAGNOSIS — J15212 Pneumonia due to Methicillin resistant Staphylococcus aureus: Secondary | ICD-10-CM | POA: Diagnosis not present

## 2022-08-12 DIAGNOSIS — R7401 Elevation of levels of liver transaminase levels: Secondary | ICD-10-CM | POA: Diagnosis not present

## 2022-08-12 DIAGNOSIS — I440 Atrioventricular block, first degree: Secondary | ICD-10-CM | POA: Diagnosis not present

## 2022-08-12 DIAGNOSIS — F411 Generalized anxiety disorder: Secondary | ICD-10-CM | POA: Diagnosis not present

## 2022-08-12 DIAGNOSIS — I480 Paroxysmal atrial fibrillation: Secondary | ICD-10-CM | POA: Diagnosis not present

## 2022-08-12 DIAGNOSIS — E8809 Other disorders of plasma-protein metabolism, not elsewhere classified: Secondary | ICD-10-CM | POA: Diagnosis not present

## 2022-08-12 DIAGNOSIS — E785 Hyperlipidemia, unspecified: Secondary | ICD-10-CM | POA: Diagnosis not present

## 2022-08-13 ENCOUNTER — Ambulatory Visit: Payer: Self-pay

## 2022-08-13 NOTE — Patient Outreach (Signed)
  Care Coordination   Follow Up Visit Note   08/13/2022 Name: Kara Mullins MRN: [id number] DOB: 1950/12/25  Kara Mullins is a 71 y.o. year old female who sees [name], [name], NP for primary care. I spoke with  Kara Mullins by phone today.  What matters to the patients health and wellness today?  Daughter is concerned about patient's chronic pain and abnormal gait.     Goals Addressed             This Visit's Progress    I would like to keep my mom out of the hospital       Care Coordination Interventions: Reviewed provider established plan for pain management Discussed importance of adherence to all scheduled medical appointments Counseled on the importance of reporting any/all new or changed pain symptoms or management strategies to pain management provider Advised patient to report to care team affect of pain on daily activities Reviewed with patient prescribed pharmacological and nonpharmacological pain relief strategies Advised patient to discuss concerns about abnormal gait due to leg turning inward with provider           SDOH assessments and interventions completed:  No     Care Coordination Interventions Activated:  Yes  Care Coordination Interventions:  Yes, provided   Follow up plan: Follow up call scheduled for 10/15/22 @11 :30 AM    Encounter Outcome:  Pt. Visit Completed

## 2022-08-13 NOTE — Patient Instructions (Signed)
Visit Information  Thank you for taking time to visit with me today. Please don't hesitate to contact me if I can be of assistance to you.   Following are the goals we discussed today:   Goals Addressed             This Visit's Progress    I would like to keep my mom out of the hospital       Care Coordination Interventions: Reviewed provider established plan for pain management Discussed importance of adherence to all scheduled medical appointments Counseled on the importance of reporting any/all new or changed pain symptoms or management strategies to pain management provider Advised patient to report to care team affect of pain on daily activities Reviewed with patient prescribed pharmacological and nonpharmacological pain relief strategies Advised patient to discuss concerns about abnormal gait due to leg turning inward with provider           Our next appointment is by telephone on 10/15/22 at 11:30 AM  Please call the care guide team at [phone] if you need to cancel or reschedule your appointment.   If you are experiencing a Mental Health or [place] or need someone to talk to, please call 1-800-273-TALK (toll free, 24 hour hotline)  Patient verbalizes understanding of instructions and care plan provided today and agrees to view in [place]. Active MyChart status and patient understanding of how to access instructions and care plan via MyChart confirmed with patient.     Barb Merino, RN, BSN, CCM Care Management Coordinator [facility] Care Management Direct Phone: [phone]

## 2022-08-14 ENCOUNTER — Emergency Department (HOSPITAL_COMMUNITY): Payer: Medicare (Managed Care)

## 2022-08-14 ENCOUNTER — Other Ambulatory Visit: Payer: Self-pay

## 2022-08-14 ENCOUNTER — Inpatient Hospital Stay (HOSPITAL_COMMUNITY)
Admission: EM | Admit: 2022-08-14 | Discharge: 2022-08-18 | DRG: 291 | Disposition: A | Discharge status: Home Health | Payer: Medicare (Managed Care) | Attending: Family Medicine | Admitting: Family Medicine

## 2022-08-14 ENCOUNTER — Encounter (HOSPITAL_COMMUNITY): Payer: Self-pay | Admitting: Emergency Medicine

## 2022-08-14 DIAGNOSIS — I132 Hypertensive heart and chronic kidney disease with heart failure and with stage 5 chronic kidney disease, or end stage renal disease: Secondary | ICD-10-CM | POA: Diagnosis not present

## 2022-08-14 DIAGNOSIS — I509 Heart failure, unspecified: Secondary | ICD-10-CM | POA: Diagnosis not present

## 2022-08-14 DIAGNOSIS — N1330 Unspecified hydronephrosis: Secondary | ICD-10-CM | POA: Diagnosis present

## 2022-08-14 DIAGNOSIS — E161 Other hypoglycemia: Secondary | ICD-10-CM | POA: Diagnosis not present

## 2022-08-14 DIAGNOSIS — I110 Hypertensive heart disease with heart failure: Secondary | ICD-10-CM | POA: Diagnosis not present

## 2022-08-14 DIAGNOSIS — R7989 Other specified abnormal findings of blood chemistry: Secondary | ICD-10-CM

## 2022-08-14 DIAGNOSIS — F411 Generalized anxiety disorder: Secondary | ICD-10-CM | POA: Diagnosis not present

## 2022-08-14 DIAGNOSIS — Z7189 Other specified counseling: Secondary | ICD-10-CM | POA: Diagnosis not present

## 2022-08-14 DIAGNOSIS — E669 Obesity, unspecified: Secondary | ICD-10-CM | POA: Diagnosis present

## 2022-08-14 DIAGNOSIS — R339 Retention of urine, unspecified: Secondary | ICD-10-CM | POA: Diagnosis present

## 2022-08-14 DIAGNOSIS — I161 Hypertensive emergency: Secondary | ICD-10-CM | POA: Diagnosis not present

## 2022-08-14 DIAGNOSIS — J811 Chronic pulmonary edema: Secondary | ICD-10-CM | POA: Diagnosis not present

## 2022-08-14 DIAGNOSIS — I5033 Acute on chronic diastolic (congestive) heart failure: Secondary | ICD-10-CM | POA: Diagnosis not present

## 2022-08-14 DIAGNOSIS — Z993 Dependence on wheelchair: Secondary | ICD-10-CM | POA: Diagnosis not present

## 2022-08-14 DIAGNOSIS — A419 Sepsis, unspecified organism: Secondary | ICD-10-CM | POA: Diagnosis not present

## 2022-08-14 DIAGNOSIS — Z862 Personal history of diseases of the blood and blood-forming organs and certain disorders involving the immune mechanism: Secondary | ICD-10-CM | POA: Diagnosis not present

## 2022-08-14 DIAGNOSIS — I10 Essential (primary) hypertension: Secondary | ICD-10-CM | POA: Diagnosis not present

## 2022-08-14 DIAGNOSIS — T68XXXA Hypothermia, initial encounter: Secondary | ICD-10-CM

## 2022-08-14 DIAGNOSIS — E785 Hyperlipidemia, unspecified: Secondary | ICD-10-CM | POA: Diagnosis present

## 2022-08-14 DIAGNOSIS — Z79899 Other long term (current) drug therapy: Secondary | ICD-10-CM

## 2022-08-14 DIAGNOSIS — N179 Acute kidney failure, unspecified: Secondary | ICD-10-CM | POA: Diagnosis present

## 2022-08-14 DIAGNOSIS — N189 Chronic kidney disease, unspecified: Secondary | ICD-10-CM

## 2022-08-14 DIAGNOSIS — R7401 Elevation of levels of liver transaminase levels: Secondary | ICD-10-CM | POA: Diagnosis not present

## 2022-08-14 DIAGNOSIS — Z9181 History of falling: Secondary | ICD-10-CM | POA: Diagnosis not present

## 2022-08-14 DIAGNOSIS — Z808 Family history of malignant neoplasm of other organs or systems: Secondary | ICD-10-CM

## 2022-08-14 DIAGNOSIS — I130 Hypertensive heart and chronic kidney disease with heart failure and stage 1 through stage 4 chronic kidney disease, or unspecified chronic kidney disease: Secondary | ICD-10-CM | POA: Diagnosis not present

## 2022-08-14 DIAGNOSIS — D631 Anemia in chronic kidney disease: Secondary | ICD-10-CM | POA: Diagnosis present

## 2022-08-14 DIAGNOSIS — N185 Chronic kidney disease, stage 5: Secondary | ICD-10-CM | POA: Diagnosis not present

## 2022-08-14 DIAGNOSIS — I5042 Chronic combined systolic (congestive) and diastolic (congestive) heart failure: Secondary | ICD-10-CM | POA: Diagnosis not present

## 2022-08-14 DIAGNOSIS — E8809 Other disorders of plasma-protein metabolism, not elsewhere classified: Secondary | ICD-10-CM | POA: Diagnosis not present

## 2022-08-14 DIAGNOSIS — I480 Paroxysmal atrial fibrillation: Secondary | ICD-10-CM | POA: Diagnosis not present

## 2022-08-14 DIAGNOSIS — J90 Pleural effusion, not elsewhere classified: Secondary | ICD-10-CM | POA: Diagnosis not present

## 2022-08-14 DIAGNOSIS — E038 Other specified hypothyroidism: Secondary | ICD-10-CM | POA: Diagnosis present

## 2022-08-14 DIAGNOSIS — R0602 Shortness of breath: Secondary | ICD-10-CM | POA: Diagnosis not present

## 2022-08-14 DIAGNOSIS — G4489 Other headache syndrome: Secondary | ICD-10-CM | POA: Diagnosis not present

## 2022-08-14 DIAGNOSIS — J15212 Pneumonia due to Methicillin resistant Staphylococcus aureus: Secondary | ICD-10-CM | POA: Diagnosis not present

## 2022-08-14 DIAGNOSIS — R9431 Abnormal electrocardiogram [ECG] [EKG]: Secondary | ICD-10-CM | POA: Diagnosis present

## 2022-08-14 DIAGNOSIS — Z6831 Body mass index (BMI) 31.0-31.9, adult: Secondary | ICD-10-CM

## 2022-08-14 DIAGNOSIS — I440 Atrioventricular block, first degree: Secondary | ICD-10-CM | POA: Diagnosis not present

## 2022-08-14 DIAGNOSIS — D649 Anemia, unspecified: Secondary | ICD-10-CM | POA: Insufficient documentation

## 2022-08-14 DIAGNOSIS — R5381 Other malaise: Secondary | ICD-10-CM | POA: Diagnosis not present

## 2022-08-14 DIAGNOSIS — R531 Weakness: Secondary | ICD-10-CM | POA: Diagnosis not present

## 2022-08-14 DIAGNOSIS — E02 Subclinical iodine-deficiency hypothyroidism: Secondary | ICD-10-CM | POA: Diagnosis not present

## 2022-08-14 DIAGNOSIS — R8281 Pyuria: Secondary | ICD-10-CM

## 2022-08-14 DIAGNOSIS — R450 Nervousness: Secondary | ICD-10-CM | POA: Diagnosis not present

## 2022-08-14 DIAGNOSIS — N1832 Chronic kidney disease, stage 3b: Secondary | ICD-10-CM | POA: Diagnosis not present

## 2022-08-14 DIAGNOSIS — R680 Hypothermia, not associated with low environmental temperature: Secondary | ICD-10-CM | POA: Diagnosis present

## 2022-08-14 DIAGNOSIS — U071 COVID-19: Secondary | ICD-10-CM | POA: Diagnosis not present

## 2022-08-14 DIAGNOSIS — E162 Hypoglycemia, unspecified: Secondary | ICD-10-CM | POA: Diagnosis present

## 2022-08-14 DIAGNOSIS — R0989 Other specified symptoms and signs involving the circulatory and respiratory systems: Secondary | ICD-10-CM | POA: Diagnosis not present

## 2022-08-14 LAB — CBC WITH DIFFERENTIAL/PLATELET
Abs Immature Granulocytes: 0.01 10*3/uL (ref 0.00–0.07)
Basophils Absolute: 0 10*3/uL (ref 0.0–0.1)
Basophils Relative: 1 %
Eosinophils Absolute: 0.2 10*3/uL (ref 0.0–0.5)
Eosinophils Relative: 4 %
HCT: 27.7 % — ABNORMAL LOW (ref 36.0–46.0)
Hemoglobin: 9 g/dL — ABNORMAL LOW (ref 12.0–15.0)
Immature Granulocytes: 0 %
Lymphocytes Relative: 27 %
Lymphs Abs: 1.2 10*3/uL (ref 0.7–4.0)
MCH: 30 pg (ref 26.0–34.0)
MCHC: 32.5 g/dL (ref 30.0–36.0)
MCV: 92.3 fL (ref 80.0–100.0)
Monocytes Absolute: 0.2 10*3/uL (ref 0.1–1.0)
Monocytes Relative: 4 %
Neutro Abs: 2.9 10*3/uL (ref 1.7–7.7)
Neutrophils Relative %: 64 %
Platelets: 160 10*3/uL (ref 150–400)
RBC: 3 MIL/uL — ABNORMAL LOW (ref 3.87–5.11)
RDW: 16.6 % — ABNORMAL HIGH (ref 11.5–15.5)
WBC: 4.6 10*3/uL (ref 4.0–10.5)
nRBC: 0 % (ref 0.0–0.2)

## 2022-08-14 LAB — URINALYSIS, ROUTINE W REFLEX MICROSCOPIC
Bilirubin Urine: NEGATIVE
Glucose, UA: 50 mg/dL — AB
Hgb urine dipstick: NEGATIVE
Ketones, ur: NEGATIVE mg/dL
Nitrite: NEGATIVE
Protein, ur: >=300 mg/dL — AB
Specific Gravity, Urine: 1.013 (ref 1.005–1.030)
WBC, UA: >50 WBC/hpf — ABNORMAL HIGH (ref 0–5)
pH: 5 (ref 5.0–8.0)

## 2022-08-14 LAB — COMPREHENSIVE METABOLIC PANEL
ALT: 27 U/L (ref 0–44)
AST: 35 U/L (ref 15–41)
Albumin: 2.1 g/dL — ABNORMAL LOW (ref 3.5–5.0)
Alkaline Phosphatase: 114 U/L (ref 38–126)
Anion gap: 15 (ref 5–15)
BUN: 109 mg/dL — ABNORMAL HIGH (ref 8–23)
CO2: 17 mmol/L — ABNORMAL LOW (ref 22–32)
Calcium: 7.8 mg/dL — ABNORMAL LOW (ref 8.9–10.3)
Chloride: 113 mmol/L — ABNORMAL HIGH (ref 98–111)
Creatinine, Ser: 5.14 mg/dL — ABNORMAL HIGH (ref 0.44–1.00)
GFR, Estimated: 8 mL/min — ABNORMAL LOW (ref >60)
Glucose, Bld: 67 mg/dL — ABNORMAL LOW (ref 70–99)
Potassium: 4.5 mmol/L (ref 3.5–5.1)
Sodium: 145 mmol/L (ref 135–145)
Total Bilirubin: 0.7 mg/dL (ref 0.3–1.2)
Total Protein: 6.3 g/dL — ABNORMAL LOW (ref 6.5–8.1)

## 2022-08-14 LAB — CBG MONITORING, ED
Glucose-Capillary: 60 mg/dL — ABNORMAL LOW (ref 70–99)
Glucose-Capillary: 74 mg/dL (ref 70–99)
Glucose-Capillary: 67 mg/dL — ABNORMAL LOW (ref 70–99)

## 2022-08-14 LAB — TROPONIN I (HIGH SENSITIVITY)
Troponin I (High Sensitivity): 42 ng/L — ABNORMAL HIGH (ref <18)
Troponin I (High Sensitivity): 43 ng/L — ABNORMAL HIGH (ref <18)

## 2022-08-14 LAB — BRAIN NATRIURETIC PEPTIDE: B Natriuretic Peptide: 583.4 pg/mL — ABNORMAL HIGH (ref 0.0–100.0)

## 2022-08-14 LAB — TSH: TSH: 6.306 u[IU]/mL — ABNORMAL HIGH (ref 0.350–4.500)

## 2022-08-14 LAB — MAGNESIUM: Magnesium: 2.5 mg/dL — ABNORMAL HIGH (ref 1.7–2.4)

## 2022-08-14 LAB — LIPASE, BLOOD: Lipase: 35 U/L (ref 11–51)

## 2022-08-14 MED ORDER — HEPARIN SODIUM (PORCINE) 5000 UNIT/ML IJ SOLN
5000.0000 [IU] | Freq: Three times a day (TID) | INTRAMUSCULAR | Status: DC
  Administered 2022-08-14 – 2022-08-18 (×12): 5000 [IU] via SUBCUTANEOUS
  Filled 2022-08-14 (×12): qty 1

## 2022-08-14 MED ORDER — DEXTROSE 50 % IV SOLN
INTRAVENOUS | Status: AC
  Administered 2022-08-14: 12.5 g via INTRAVENOUS
  Filled 2022-08-14: qty 50

## 2022-08-14 MED ORDER — ACETAMINOPHEN 325 MG PO TABS: 650.0000 mg | ORAL_TABLET | Freq: Four times a day (QID) | ORAL | Status: DC | PRN

## 2022-08-14 MED ORDER — FUROSEMIDE 10 MG/ML IJ SOLN
120.0000 mg | Freq: Two times a day (BID) | INTRAVENOUS | Status: DC
  Administered 2022-08-15 – 2022-08-18 (×7): 120 mg via INTRAVENOUS
  Filled 2022-08-14: qty 2
  Filled 2022-08-14: qty 10
  Filled 2022-08-14: qty 12
  Filled 2022-08-14: qty 10
  Filled 2022-08-14 (×3): qty 12
  Filled 2022-08-14 (×2): qty 10

## 2022-08-14 MED ORDER — DEXTROSE 50 % IV SOLN: 12.5000 g | Freq: Once | INTRAVENOUS | Status: AC

## 2022-08-14 MED ORDER — FUROSEMIDE 10 MG/ML IJ SOLN
40.0000 mg | Freq: Once | INTRAMUSCULAR | Status: AC
  Administered 2022-08-14: 40 mg via INTRAVENOUS
  Filled 2022-08-14: qty 4

## 2022-08-14 MED ORDER — FUROSEMIDE 10 MG/ML IJ SOLN
60.0000 mg | Freq: Once | INTRAMUSCULAR | Status: AC
  Administered 2022-08-14: 60 mg via INTRAVENOUS
  Filled 2022-08-14: qty 6

## 2022-08-14 MED ORDER — DEXTROSE 50 % IV SOLN
1.0000 | INTRAVENOUS | Status: DC | PRN
  Administered 2022-08-14 – 2022-08-15 (×2): 50 mL via INTRAVENOUS
  Filled 2022-08-14 (×2): qty 50

## 2022-08-14 MED ORDER — NITROGLYCERIN IN D5W 200-5 MCG/ML-% IV SOLN
0.0000 ug/min | INTRAVENOUS | Status: DC
  Administered 2022-08-14: 5 ug/min via INTRAVENOUS
  Filled 2022-08-14: qty 250

## 2022-08-14 MED ORDER — ACETAMINOPHEN 650 MG RE SUPP: 650.0000 mg | Freq: Four times a day (QID) | RECTAL | Status: DC | PRN

## 2022-08-14 NOTE — ED Triage Notes (Signed)
Pt bib [facility] for shortness of breath that started last night. Pt had home health RN come out today who stated she was going to talk to pcp but pt never heard back. Per family, pt's spo2 was 63% RA. Hx of chf, pneumonia, and htn.   BP 240/110, HR 60, Spo2 100% RA, CBG 55

## 2022-08-14 NOTE — ED Notes (Signed)
Lab will add on TSH to existing specimen in the lab.

## 2022-08-14 NOTE — H&P (Signed)
History and Physical    PLEASE NOTE THAT DRAGON DICTATION SOFTWARE WAS USED IN THE CONSTRUCTION OF THIS NOTE.   Ikhlas Albo ZDG:[id number] DOB: 11/09/1950 DOA: 08/14/2022  PCP: [name], NP *** Patient coming from: home ***  I have personally briefly reviewed patient's old medical records in [place]  Chief Complaint: ***  HPI: Chasty Randal is a 71 y.o. female with medical history significant for *** who is admitted to [facility] on 08/14/2022 with *** after presenting from home*** to [facility] ED complaining of ***.    ***       ***   ED Course:  Vital signs in the ED were notable for the following: ***  Labs were notable for the following: ***  Imaging and additional notable ED work-up: ***  While in the ED, the following were administered: ***  Subsequently, the patient was admitted  ***  ***red    Review of Systems: As per HPI otherwise 10 point review of systems negative.   Past Medical History:  Diagnosis Date   Chronic combined systolic and diastolic congestive heart failure ([place]) 11/09/2021   Generalized anxiety disorder    Hyperlipidemia 11/09/2021   Hypertension    Stage 3b chronic kidney disease (CKD) ([place]) 11/09/2021    Past Surgical History:  Procedure Laterality Date   CATARACT EXTRACTION, BILATERAL     combined systolic and diastolic congestive heart failure       Social History:  reports that she has never smoked. She has never used smokeless tobacco. She reports that she does not currently use alcohol. She reports that she does not use drugs.   Allergies  Allergen Reactions   Crestor [Rosuvastatin] Other (See Comments)    "Felt like she couldn't swallow"    Family History  Problem Relation Age of Onset   Throat cancer Father     Family history reviewed and not pertinent ***   Prior to Admission medications   Medication Sig Start Date End Date Taking? Authorizing Provider  b complex-vitamin c-folic  acid (NEPHRO-VITE) 0.8 MG TABS tablet Take 1 tablet by mouth at bedtime. 07/24/22  Yes [name], [name], NP  carvedilol (COREG) 6.25 MG tablet Take 1 tablet (6.25 mg total) by mouth 2 (two) times daily with a meal. Patient taking differently: Take 12.5 mg by mouth 2 (two) times daily with a meal. 06/17/22 08/14/22 Yes Ogbata, Babs Bertin, MD  cholecalciferol (VITAMIN D3) 25 MCG (1000 UNIT) tablet Take 1,000 Units by mouth daily.   Yes [provider]  Docusate Sodium (STOOL SOFTENER LAXATIVE PO) Take 1 capsule by mouth as needed (constipation).   Yes [provider]  feeding supplement (ENSURE ENLIVE / ENSURE PLUS) LIQD Take 237 mLs by mouth 2 (two) times daily between meals. 06/17/22  Yes Dana Allan I, MD  ferrous sulfate 325 (65 FE) MG tablet Take 1 tablet (325 mg total) by mouth daily. 07/24/22 01/20/23 Yes [name], [name], NP  hydrALAZINE (APRESOLINE) 50 MG tablet TAKE 1 TABLET(50 MG) BY MOUTH EVERY 8 HOURS Patient taking differently: Take 50 mg by mouth 3 (three) times daily. 07/25/22  Yes [name], [name], NP  Menthol-Methyl Salicylate (MUSCLE RUB) 10-15 % CREA Apply 1 Application topically as needed for muscle pain.   Yes [provider]  Multiple Vitamin (MULTIVITAMIN ADULT PO) Take 1 tablet by mouth daily.   Yes [provider]  potassium chloride (MICRO-K) 10 MEQ CR capsule Take 10 mEq by mouth daily.   Yes  [provider]  sevelamer carbonate (RENVELA) 800 MG tablet Take 800 mg by mouth 3 (three) times daily. 07/10/22  Yes [provider]  torsemide (DEMADEX) 10 MG tablet TAKE 5 TABLETS (50 MG TOTAL) BY MOUTH 2 (TWO) TIMES DAILY. 07/24/22 08/23/22 Yes [name], [name], NP  Vitamin D, Ergocalciferol, (DRISDOL) 1.25 MG (50000 UNIT) CAPS capsule Take 50,000 Units by mouth once a week. 05/30/22  Yes [provider]  losartan (COZAAR) 25 MG tablet Take 25 mg by mouth daily. Patient not taking: Reported on 08/14/2022 07/14/22    [provider]     Objective    Physical Exam: Vitals:   08/14/22 2111 08/14/22 2115 08/14/22 2130 08/14/22 2158  BP: (!) 162/74 (!) 151/68 139/64 (!) 144/67  Pulse: (!) 53 (!) 53 (!) 52 (!) 54  Resp: 20   20  Temp:      TempSrc:      SpO2: 100% 100% 100% 100%  Weight:      Height:        General: appears to be stated age; alert, oriented Skin: warm, dry, no rash Head:  AT/[place] Mouth:  Oral mucosa membranes appear moist, normal dentition Neck: supple; trachea midline Heart:  RRR; did not appreciate any M/R/G Lungs: CTAB, did not appreciate any wheezes, rales, or rhonchi Abdomen: + BS; soft, ND, NT Vascular: 2+ pedal pulses b/l; 2+ radial pulses b/l Extremities: no peripheral edema, no muscle wasting Neuro: strength and sensation intact in upper and lower extremities b/l ***   *** Neuro: 5/5 strength of the proximal and distal flexors and extensors of the upper and lower extremities bilaterally; sensation intact in upper and lower extremities b/l; cranial nerves II through XII grossly intact; no pronator drift; no evidence suggestive of slurred speech, dysarthria, or facial droop; Normal muscle tone. No tremors.  *** Neuro: In the setting of the patient's current mental status and associated inability to follow instructions, unable to perform full neurologic exam at this time.  As such, assessment of strength, sensation, and cranial nerves is limited at this time. Patient noted to spontaneously move all 4 extremities. No tremors.  ***    Labs on Admission: I have personally reviewed following labs and imaging studies  CBC: Recent Labs  Lab 08/14/22 1930  WBC 4.6  NEUTROABS 2.9  HGB 9.0*  HCT 27.7*  MCV 92.3  PLT 970   Basic Metabolic Panel: Recent Labs  Lab 08/14/22 1930  NA 145  K 4.5  CL 113*  CO2 17*  GLUCOSE 67*  BUN 109*  CREATININE 5.14*  CALCIUM 7.8*   GFR: Estimated Creatinine Clearance: 10.8 mL/min (A) (by C-G formula based on SCr  of 5.14 mg/dL (H)). Liver Function Tests: Recent Labs  Lab 08/14/22 1930  AST 35  ALT 27  ALKPHOS 114  BILITOT 0.7  PROT 6.3*  ALBUMIN 2.1*   Recent Labs  Lab 08/14/22 1930  LIPASE 35   No results for input(s): "AMMONIA" in the last 168 hours. Coagulation Profile: No results for input(s): "INR", "PROTIME" in the last 168 hours. Cardiac Enzymes: No results for input(s): "CKTOTAL", "CKMB", "CKMBINDEX", "TROPONINI" in the last 168 hours. BNP (last 3 results) No results for input(s): "PROBNP" in the last 8760 hours. HbA1C: No results for input(s): "HGBA1C" in the last 72 hours. CBG: Recent Labs  Lab 08/14/22 1932 08/14/22 2136  GLUCAP 60* 74   Lipid Profile: No results for input(s): "CHOL", "HDL", "LDLCALC", "TRIG", "CHOLHDL", "LDLDIRECT" in the last 72 hours. Thyroid  Function Tests: No results for input(s): "TSH", "T4TOTAL", "FREET4", "T3FREE", "THYROIDAB" in the last 72 hours. Anemia Panel: No results for input(s): "VITAMINB12", "FOLATE", "FERRITIN", "TIBC", "IRON", "RETICCTPCT" in the last 72 hours. Urine analysis:    Component Value Date/Time   COLORURINE YELLOW 08/14/2022 1939   APPEARANCEUR CLOUDY (A) 08/14/2022 1939   LABSPEC 1.013 08/14/2022 1939   PHURINE 5.0 08/14/2022 1939   GLUCOSEU 50 (A) 08/14/2022 1939   HGBUR NEGATIVE 08/14/2022 [place] NEGATIVE 08/14/2022 [place] NEGATIVE 08/14/2022 1939   PROTEINUR >=300 (A) 08/14/2022 1939   UROBILINOGEN 0.2 11/19/2019 2004   NITRITE NEGATIVE 08/14/2022 1939   LEUKOCYTESUR SMALL (A) 08/14/2022 1939    Radiological Exams on Admission: DG Chest Portable 1 View  Result Date: 08/14/2022 CLINICAL DATA:  Shortness of breath EXAM: PORTABLE CHEST 1 VIEW COMPARISON:  06/10/2022 FINDINGS: Cardiomegaly, vascular congestion. Mild perihilar and lower lobe opacities could reflect early edema. Small bilateral effusions. No acute bony abnormality. IMPRESSION: Cardiomegaly with vascular congestion and  possible early pulmonary edema. Small bilateral effusions. Electronically Signed   By: Rolm Baptise M.D.   On: 08/14/2022 20:15     EKG: Independently reviewed, with result as described above. ***   Assessment/Plan   Principal Problem:   Acute on chronic diastolic heart failure (HCC)   ***       ***             ***PO: 1 mg bumetanide = 20 mg torsemide = 80 mg ORAL furosemide ***IV: 1 mg bumetanide = 20 mg torsemide = 40 mg IV furosemide  *** IV to PO torsemide is 1:1 *** IV to PO bumetanide is 1:1  *** thiazide diuretic with loop diuretic (synergistic)      ***             ***            ***            ***            ***           ***   ***  DVT prophylaxis: SCD's ***  Code Status: Full code*** Family Communication: none*** Disposition Plan: Per Rounding Team Consults called: none***;  Admission status: ***    PLEASE NOTE THAT DRAGON DICTATION SOFTWARE WAS USED IN THE CONSTRUCTION OF THIS NOTE.   [place] DO [facility]  From [place]   08/14/2022, 10:02 PM   ***

## 2022-08-14 NOTE — ED Provider Notes (Signed)
[facility] EMERGENCY DEPARTMENT Provider Note   CSN: [id number] Arrival date & time: 08/14/22  1912     History  Chief Complaint  Patient presents with   Shortness of Breath    Kara Mullins is a 71 y.o. female.  Level 5 caveat for respiratory distress.  Patient brought in by EMS progressively worsening shortness of breath x 2 days.  Does have a history of CKD, heart failure, hypertension, not on oxygen at home.  Denies any cough or fever.  Denies any chest pain.  Has had increased swelling to her legs compared to baseline despite compliance with her diuretics.  Does have indwelling Foley catheter which was exchanged yesterday.  States compliance with her medications.  PND and orthopnea at baseline but leg swelling and difficulty breathing are worse.  Home health nurse said she heard crackles on exam and patient had O2 saturation in the 60s at home.  She was placed on nasal cannula oxygen by EMS.  O2 saturation 97 on arrival.  The history is provided by the patient.  Shortness of Breath Associated symptoms: no abdominal pain, no chest pain, no fever and no vomiting        Home Medications Prior to Admission medications   Medication Sig Start Date End Date Taking? Authorizing Provider  carvedilol (COREG) 6.25 MG tablet Take 1 tablet (6.25 mg total) by mouth 2 (two) times daily with a meal. 06/17/22 08/14/22 Yes Bonnell Public, MD  ferrous sulfate 325 (65 FE) MG tablet Take 1 tablet (325 mg total) by mouth daily. 07/24/22 01/20/23 Yes [name], [name], NP  hydrALAZINE (APRESOLINE) 50 MG tablet TAKE 1 TABLET(50 MG) BY MOUTH EVERY 8 HOURS 07/25/22  Yes [name], [name], NP  torsemide (DEMADEX) 10 MG tablet TAKE 5 TABLETS (50 MG TOTAL) BY MOUTH 2 (TWO) TIMES DAILY. 07/24/22 08/23/22 Yes [name], [name], NP  b complex-vitamin c-folic acid (NEPHRO-VITE) 0.8 MG TABS tablet Take 1 tablet by mouth at bedtime. 07/24/22   [name], [name], NP  cholecalciferol (VITAMIN D3)  25 MCG (1000 UNIT) tablet Take 1,000 Units by mouth daily.    [provider]  feeding supplement (ENSURE ENLIVE / ENSURE PLUS) LIQD Take 237 mLs by mouth 2 (two) times daily between meals. 06/17/22   Bonnell Public, MD  losartan (COZAAR) 25 MG tablet Take 25 mg by mouth daily. 07/14/22   [provider]  Menthol-Methyl Salicylate (MUSCLE RUB) 10-15 % CREA Apply 1 Application topically as needed for muscle pain.    [provider]  potassium chloride (MICRO-K) 10 MEQ CR capsule Take 10 mEq by mouth daily.    [provider]  Vitamin D, Ergocalciferol, (DRISDOL) 1.25 MG (50000 UNIT) CAPS capsule Take 50,000 Units by mouth once a week. 05/30/22   [provider]      Allergies    Crestor [rosuvastatin]    Review of Systems   Review of Systems  Constitutional:  Negative for activity change, appetite change and fever.  HENT:  Negative for congestion.   Respiratory:  Positive for shortness of breath. Negative for chest tightness.   Cardiovascular:  Positive for leg swelling. Negative for chest pain.  Gastrointestinal:  Negative for abdominal pain, nausea and vomiting.  Genitourinary:  Negative for dysuria and hematuria.  Musculoskeletal:  Negative for arthralgias and myalgias.  Neurological:  Positive for weakness.   all other systems are negative except as noted in the HPI and PMH.    Physical Exam Updated Vital  Signs BP (!) 190/99   Pulse (!) 59   Resp 10   Ht 5\' 5"  (1.651 m)   Wt 83 kg   SpO2 99%   BMI 30.45 kg/m  Physical Exam Vitals and nursing note reviewed.  Constitutional:      General: She is not in acute distress.    Appearance: She is well-developed. She is ill-appearing.     Comments: Chronically ill-appearing, dyspneic with conversation  HENT:     Head: Normocephalic and atraumatic.     Mouth/Throat:     Pharynx: No oropharyngeal exudate.  Eyes:     Conjunctiva/sclera: Conjunctivae normal.     Pupils: Pupils are  equal, round, and reactive to light.  Neck:     Comments: No meningismus. Cardiovascular:     Rate and Rhythm: Normal rate and regular rhythm.     Heart sounds: Normal heart sounds. No murmur heard. Pulmonary:     Effort: Pulmonary effort is normal. No respiratory distress.     Breath sounds: Rales present.  Abdominal:     Palpations: Abdomen is soft.     Tenderness: There is no abdominal tenderness. There is no guarding or rebound.  Genitourinary:    Comments: Indwelling Foley catheter draining clear urine Musculoskeletal:        General: No tenderness. Normal range of motion.     Cervical back: Normal range of motion and neck supple.     Right lower leg: Edema present.     Left lower leg: Edema present.  Skin:    General: Skin is warm.  Neurological:     Mental Status: She is alert and oriented to person, place, and time.     Cranial Nerves: No cranial nerve deficit.     Motor: No abnormal muscle tone.     Coordination: Coordination normal.     Comments:  5/5 strength throughout. CN 2-12 intact.Equal grip strength.   Psychiatric:        Behavior: Behavior normal.     ED Results / Procedures / Treatments   Labs (all labs ordered are listed, but only abnormal results are displayed) Labs Reviewed  CBC WITH DIFFERENTIAL/PLATELET - Abnormal; Notable for the following components:      Result Value   RBC 3.00 (*)    Hemoglobin 9.0 (*)    HCT 27.7 (*)    RDW 16.6 (*)    All other components within normal limits  COMPREHENSIVE METABOLIC PANEL - Abnormal; Notable for the following components:   Chloride 113 (*)    CO2 17 (*)    Glucose, Bld 67 (*)    BUN 109 (*)    Creatinine, Ser 5.14 (*)    Calcium 7.8 (*)    Total Protein 6.3 (*)    Albumin 2.1 (*)    GFR, Estimated 8 (*)    All other components within normal limits  BRAIN NATRIURETIC PEPTIDE - Abnormal; Notable for the following components:   B Natriuretic Peptide 583.4 (*)    All other components within normal  limits  URINALYSIS, ROUTINE W REFLEX MICROSCOPIC - Abnormal; Notable for the following components:   APPearance CLOUDY (*)    Glucose, UA 50 (*)    Protein, ur >=300 (*)    Leukocytes,Ua SMALL (*)    WBC, UA >50 (*)    Bacteria, UA MANY (*)    All other components within normal limits  CBG MONITORING, ED - Abnormal; Notable for the following components:   Glucose-Capillary 60 (*)  All other components within normal limits  TROPONIN I (HIGH SENSITIVITY) - Abnormal; Notable for the following components:   Troponin I (High Sensitivity) 42 (*)    All other components within normal limits  LIPASE, BLOOD  TSH  CBC WITH DIFFERENTIAL/PLATELET  COMPREHENSIVE METABOLIC PANEL  MAGNESIUM  MAGNESIUM  CBG MONITORING, ED  CBG MONITORING, ED  TROPONIN I (HIGH SENSITIVITY)    EKG EKG Interpretation  Date/Time:  Wednesday August 14 2022 19:19:19 EDT Ventricular Rate:  60 PR Interval:  212 QRS Duration: 102 QT Interval:  511 QTC Calculation: 511 R Axis:   16 Text Interpretation: Sinus rhythm Borderline prolonged PR interval Anterior infarct, old Prolonged QT interval No significant change was found Confirmed by Ezequiel Essex [phone]) on 08/14/2022 8:00:44 PM  Radiology DG Chest Portable 1 View  Result Date: 08/14/2022 CLINICAL DATA:  Shortness of breath EXAM: PORTABLE CHEST 1 VIEW COMPARISON:  06/10/2022 FINDINGS: Cardiomegaly, vascular congestion. Mild perihilar and lower lobe opacities could reflect early edema. Small bilateral effusions. No acute bony abnormality. IMPRESSION: Cardiomegaly with vascular congestion and possible early pulmonary edema. Small bilateral effusions. Electronically Signed   By: Rolm Baptise M.D.   On: 08/14/2022 20:15    Procedures .Critical Care  Performed by: Ezequiel Essex, MD Authorized by: Ezequiel Essex, MD   Critical care provider statement:    Critical care time (minutes):  35   Critical care time was exclusive of:  Separately billable  procedures and treating other patients   Critical care was necessary to treat or prevent imminent or life-threatening deterioration of the following conditions:  Respiratory failure and cardiac failure   Critical care was time spent personally by me on the following activities:  Development of treatment plan with patient or surrogate, discussions with consultants, evaluation of patient's response to treatment, examination of patient, ordering and review of laboratory studies, ordering and review of radiographic studies, ordering and performing treatments and interventions, pulse oximetry, re-evaluation of patient's condition, review of old charts and blood draw for specimens   I assumed direction of critical care for this patient from another provider in my specialty: no     Care discussed with: admitting provider       Medications Ordered in ED Medications  furosemide (LASIX) injection 40 mg (has no administration in time range)  nitroGLYCERIN 50 mg in dextrose 5 % 250 mL (0.2 mg/mL) infusion (has no administration in time range)  dextrose 50 % solution 12.5 g (12.5 g Intravenous Given 08/14/22 1951)    ED Course/ Medical Decision Making/ A&P                           Medical Decision Making Amount and/or Complexity of Data Reviewed Labs: ordered. Decision-making details documented in ED Course. Radiology: ordered and independent interpretation performed. Decision-making details documented in ED Course. ECG/medicine tests: ordered and independent interpretation performed. Decision-making details documented in ED Course.  Risk Prescription drug management. Decision regarding hospitalization.  Patient with a history of diastolic failure here with increased shortness of breath for the past 2 days and hypoxia at home.  On arrival she is placed on nasal cannula oxygen.  EKG without acute ischemia.  Presentation consistent with CHF.  She is given IV Lasix and started on IV nitroglycerin for  hypertensive emergency.  Labs show minimal troponin elevation consistent with previous values, creatinine 5.1 which appears to be near her baseline Urinalysis appears contaminated and is a dirty sample from a Foley  catheter.  Plan admission for IV diuresis and anasarca.  Her creatinine appears to be near baseline.  She is hypothermic and hypoglycemic on arrival.  We will add on TSH studies  Concern for possible hypothyroidism and myxedema.  She is hypothermic and hypoglycemic on arrival.  Blood sugar improving with D50 and p.o. food.  We will need mission for continued IV diuresis.  Creatinine is at baseline.  Blood pressure has improved with nitroglycerin infusion.  No chest pain.  Admission d/w Dr. Velia Meyer.         Final Clinical Impression(s) / ED Diagnoses Final diagnoses:  Acute on chronic diastolic congestive heart failure ([place])  Hypothermia, initial encounter    Rx / DC Orders ED Discharge Orders     None         Kamiya Acord, Annie Main, MD 08/14/22 2206

## 2022-08-15 ENCOUNTER — Encounter (HOSPITAL_COMMUNITY): Payer: Self-pay | Admitting: Internal Medicine

## 2022-08-15 ENCOUNTER — Inpatient Hospital Stay (HOSPITAL_COMMUNITY): Payer: Medicare (Managed Care)

## 2022-08-15 DIAGNOSIS — I5033 Acute on chronic diastolic (congestive) heart failure: Secondary | ICD-10-CM | POA: Diagnosis not present

## 2022-08-15 DIAGNOSIS — T68XXXA Hypothermia, initial encounter: Secondary | ICD-10-CM

## 2022-08-15 DIAGNOSIS — Z7189 Other specified counseling: Secondary | ICD-10-CM | POA: Diagnosis not present

## 2022-08-15 DIAGNOSIS — I161 Hypertensive emergency: Secondary | ICD-10-CM | POA: Insufficient documentation

## 2022-08-15 DIAGNOSIS — E669 Obesity, unspecified: Secondary | ICD-10-CM | POA: Insufficient documentation

## 2022-08-15 DIAGNOSIS — E162 Hypoglycemia, unspecified: Secondary | ICD-10-CM | POA: Diagnosis present

## 2022-08-15 LAB — COMPREHENSIVE METABOLIC PANEL
ALT: 21 U/L (ref 0–44)
AST: 27 U/L (ref 15–41)
Albumin: 1.7 g/dL — ABNORMAL LOW (ref 3.5–5.0)
Alkaline Phosphatase: 101 U/L (ref 38–126)
Anion gap: 9 (ref 5–15)
BUN: 107 mg/dL — ABNORMAL HIGH (ref 8–23)
CO2: 19 mmol/L — ABNORMAL LOW (ref 22–32)
Calcium: 7.4 mg/dL — ABNORMAL LOW (ref 8.9–10.3)
Chloride: 115 mmol/L — ABNORMAL HIGH (ref 98–111)
Creatinine, Ser: 5.2 mg/dL — ABNORMAL HIGH (ref 0.44–1.00)
GFR, Estimated: 8 mL/min — ABNORMAL LOW (ref >60)
Glucose, Bld: 78 mg/dL (ref 70–99)
Potassium: 3.9 mmol/L (ref 3.5–5.1)
Sodium: 143 mmol/L (ref 135–145)
Total Bilirubin: 0.3 mg/dL (ref 0.3–1.2)
Total Protein: 5.3 g/dL — ABNORMAL LOW (ref 6.5–8.1)

## 2022-08-15 LAB — CBC WITH DIFFERENTIAL/PLATELET
Abs Immature Granulocytes: 0.01 10*3/uL (ref 0.00–0.07)
Basophils Absolute: 0 10*3/uL (ref 0.0–0.1)
Basophils Relative: 0 %
Eosinophils Absolute: 0.1 10*3/uL (ref 0.0–0.5)
Eosinophils Relative: 3 %
HCT: 23.6 % — ABNORMAL LOW (ref 36.0–46.0)
Hemoglobin: 7.2 g/dL — ABNORMAL LOW (ref 12.0–15.0)
Immature Granulocytes: 0 %
Lymphocytes Relative: 27 %
Lymphs Abs: 1.2 10*3/uL (ref 0.7–4.0)
MCH: 29 pg (ref 26.0–34.0)
MCHC: 30.5 g/dL (ref 30.0–36.0)
MCV: 95.2 fL (ref 80.0–100.0)
Monocytes Absolute: 0.3 10*3/uL (ref 0.1–1.0)
Monocytes Relative: 7 %
Neutro Abs: 2.8 10*3/uL (ref 1.7–7.7)
Neutrophils Relative %: 63 %
Platelets: 150 10*3/uL (ref 150–400)
RBC: 2.48 MIL/uL — ABNORMAL LOW (ref 3.87–5.11)
RDW: 16.3 % — ABNORMAL HIGH (ref 11.5–15.5)
WBC: 4.5 10*3/uL (ref 4.0–10.5)
nRBC: 0 % (ref 0.0–0.2)

## 2022-08-15 LAB — CBC
HCT: 24.7 % — ABNORMAL LOW (ref 36.0–46.0)
Hemoglobin: 7.8 g/dL — ABNORMAL LOW (ref 12.0–15.0)
MCH: 29.8 pg (ref 26.0–34.0)
MCHC: 31.6 g/dL (ref 30.0–36.0)
MCV: 94.3 fL (ref 80.0–100.0)
Platelets: 148 10*3/uL — ABNORMAL LOW (ref 150–400)
RBC: 2.62 MIL/uL — ABNORMAL LOW (ref 3.87–5.11)
RDW: 16.7 % — ABNORMAL HIGH (ref 11.5–15.5)
WBC: 4.9 10*3/uL (ref 4.0–10.5)
nRBC: 0 % (ref 0.0–0.2)

## 2022-08-15 LAB — IRON AND TIBC
Iron: 62 ug/dL (ref 28–170)
Saturation Ratios: 40 % — ABNORMAL HIGH (ref 10.4–31.8)
TIBC: 157 ug/dL — ABNORMAL LOW (ref 250–450)
UIBC: 95 ug/dL

## 2022-08-15 LAB — CBG MONITORING, ED
Glucose-Capillary: 65 mg/dL — ABNORMAL LOW (ref 70–99)
Glucose-Capillary: 74 mg/dL (ref 70–99)
Glucose-Capillary: 82 mg/dL (ref 70–99)
Glucose-Capillary: 84 mg/dL (ref 70–99)
Glucose-Capillary: 92 mg/dL (ref 70–99)
Glucose-Capillary: 97 mg/dL (ref 70–99)

## 2022-08-15 LAB — ABO/RH: ABO/RH(D): AB POS

## 2022-08-15 LAB — PROCALCITONIN: Procalcitonin: 0.11 ng/mL

## 2022-08-15 LAB — TYPE AND SCREEN
ABO/RH(D): AB POS
Antibody Screen: NEGATIVE

## 2022-08-15 LAB — TROPONIN I (HIGH SENSITIVITY): Troponin I (High Sensitivity): 37 ng/L — ABNORMAL HIGH (ref <18)

## 2022-08-15 LAB — PROTIME-INR
INR: 1.2 (ref 0.8–1.2)
Prothrombin Time: 15.2 seconds (ref 11.4–15.2)

## 2022-08-15 LAB — PROTEIN / CREATININE RATIO, URINE
Creatinine, Urine: 47 mg/dL
Protein Creatinine Ratio: 17.7 mg/mg{Cre} — ABNORMAL HIGH (ref 0.00–0.15)
Total Protein, Urine: 832 mg/dL

## 2022-08-15 LAB — GLUCOSE, CAPILLARY
Glucose-Capillary: 71 mg/dL (ref 70–99)
Glucose-Capillary: 68 mg/dL — ABNORMAL LOW (ref 70–99)
Glucose-Capillary: 86 mg/dL (ref 70–99)

## 2022-08-15 LAB — T4, FREE: Free T4: 0.99 ng/dL (ref 0.61–1.12)

## 2022-08-15 LAB — MAGNESIUM: Magnesium: 2.5 mg/dL — ABNORMAL HIGH (ref 1.7–2.4)

## 2022-08-15 LAB — FERRITIN: Ferritin: 438 ng/mL — ABNORMAL HIGH (ref 11–307)

## 2022-08-15 LAB — PHOSPHORUS: Phosphorus: 8.5 mg/dL — ABNORMAL HIGH (ref 2.5–4.6)

## 2022-08-15 MED ORDER — FERROUS SULFATE 325 (65 FE) MG PO TABS
325.0000 mg | ORAL_TABLET | Freq: Every day | ORAL | Status: DC
  Administered 2022-08-15 – 2022-08-18 (×4): 325 mg via ORAL
  Filled 2022-08-15 (×4): qty 1

## 2022-08-15 MED ORDER — SEVELAMER CARBONATE 800 MG PO TABS
800.0000 mg | ORAL_TABLET | Freq: Three times a day (TID) | ORAL | Status: DC
  Administered 2022-08-15 – 2022-08-18 (×9): 800 mg via ORAL
  Filled 2022-08-15 (×9): qty 1

## 2022-08-15 MED ORDER — HYDRALAZINE HCL 50 MG PO TABS
50.0000 mg | ORAL_TABLET | Freq: Three times a day (TID) | ORAL | Status: DC
  Administered 2022-08-15 – 2022-08-18 (×10): 50 mg via ORAL
  Filled 2022-08-15 (×10): qty 1

## 2022-08-15 MED ORDER — NITROGLYCERIN IN D5W 200-5 MCG/ML-% IV SOLN: 0.0000 ug/min | INTRAVENOUS | Status: DC

## 2022-08-15 MED ORDER — CARVEDILOL 6.25 MG PO TABS
6.2500 mg | ORAL_TABLET | Freq: Two times a day (BID) | ORAL | Status: DC
  Administered 2022-08-15 – 2022-08-18 (×7): 6.25 mg via ORAL
  Filled 2022-08-15: qty 2
  Filled 2022-08-15 (×6): qty 1

## 2022-08-15 MED ORDER — CHLORHEXIDINE GLUCONATE CLOTH 2 % EX PADS
6.0000 | MEDICATED_PAD | Freq: Every day | CUTANEOUS | Status: DC
  Administered 2022-08-15 – 2022-08-18 (×4): 6 via TOPICAL

## 2022-08-15 NOTE — Assessment & Plan Note (Addendum)
Net even overnight, no response to IV Lasix, although patient claims she "made a lot of urine".  Perhaps nursing didn't document it, as she appears much less edematous.  Cr and K stable  Echo 2 months ago showed EF up to 55%, grade I DD - Continue Furosemide 120 mg IV twice a day  - Monitor K - Strict I/Os, daily weights, telemetry  - Daily monitoring renal function - Consult Nephrology

## 2022-08-15 NOTE — Assessment & Plan Note (Addendum)
Longstanding.  Probably from chronic kidney disease.  Prior work up for MM was inconclusive.  It looks like her SNF [facility] sent a referral to [facility] in 2023, but family canceled it "[Daughter wanted] to know if it is ok NOT to take patient to the [facility]. Stated that it is hard for the patient and if they cannot do anything for her, she wants to keep her comfortable."  Hgb here 7.2, no clinical bleeding - Appreciate Nephrology recommendations - Continue iron

## 2022-08-15 NOTE — ED Notes (Signed)
This NT helped pt with breakfast at 8 a.m. pt only ate half of oatmeal and drank all of orange juice. NT just finished helping pt eat applesauce.

## 2022-08-15 NOTE — Assessment & Plan Note (Addendum)
Admitted with BP >190 and CHF.  Nitro gtt used overnight, weaned off on hospital day 2. BP well controlled 110s/120s overnight off drip. - Stop nitro drip - COntinue Lasix, COreg - Continue hydralazine

## 2022-08-15 NOTE — Assessment & Plan Note (Addendum)
Baseline creatinine ranging 1.3-1.6 in mid 2022, had an episode of CIN that year, but Cr stayed <2.  Monoclonal IgG kappa noted, bone marrow biopsy with 7% plasma cells, but no overt MM. No hematology follow up ever.    P/C ratio 17(!).  Not a candidate for HD per Nephrology, and patient does not wish for same.   - Follow SPEP, urine immunofixation - Strict I/Os - Continue diuresis

## 2022-08-15 NOTE — Assessment & Plan Note (Signed)
Expected with indwelling foley.  No treatment necessary.

## 2022-08-15 NOTE — Hospital Course (Addendum)
Kara Mullins is a 71 y.o. F with CKD V not on HD, wheelchair bound, chronic indwelling foley due to urine retention, sdCHF most recent EF 55%, pAF not on AC, and history of IgG kappa monoclonal protein who presented with acute on chronic swelling and dyspnea over last few days.  In the ER, BP 190/99 and very edematous.  CXR showed bilateral effusions and edema.  She was given Lasix 100 mg IV and admitted for further work up.  Cr 5.14 mg/dL, consistent with her most recent values 2 months ago.    11/1: Admitted on Lasix 11/2: Nephrology consulted

## 2022-08-15 NOTE — Consult Note (Signed)
Reason for Consult:AKI/CKD  Referring Physician: Loleta Books, MD  Kara Mullins is an 71 y.o. female with a PMH significant for chronic combined systolic and diastolic CHF, HTN, HLD, PAF, and progressive CKD stage V with severe nephrotic syndrome who presented to [facility] ED on 08/14/22 with 2 day history of worsening SOB and increased bilateral lower extremity edema.  In the ED, Temp 33.7, BP 190/99, RR 24, SpO2 99% on room air.  She was started on nitro gtt with drop in SBP into the low 100's.  CXR showed cardiomegaly and increased pulmonary vascular congestion and pulmonary edema with small bilateral pleural effusions.  Labs were notable for K 4.5, Co2 17, BUN 109, Cr 5.14, glucose 67, Ca 7.8, alb 2.1, BNP 583.4, Hgb 9, TSH 6.3.  She was admitted for acute on chronic CHF and we were consulted for AKI/CKD stage V.  She has been hospitalized multiple times over the past 5 months and was followed by our service.  The trend in Scr is seen below.    Her CKD rapidly progressed from stage IIIb in 2022 but has continued to worsen over the past year.  She had + SPEP for monoclonal IgG kappa, bone marrow biopsy with 7% plasma cells but no over multiple myeloma.  Unfortunately has not had hematology follow up.  Serologies have been negative and she never had a kidney biopsy.  There have been multiple discussions with palliative care as she was felt to be a poor candidate for dialysis given her poor functional and nutritional status.  She has discussed this with Dr. Royce Macadamia in our office and has made the decision that she does not want to pursue dialysis.  She confirmed that decision with me today.  Trend in Creatinine:   Creatinine, Ser  Date/Time Value Ref Range Status  08/15/2022 03:30 AM 5.20 (H) 0.44 - 1.00 mg/dL Final  08/14/2022 07:30 PM 5.14 (H) 0.44 - 1.00 mg/dL Final  06/17/2022 02:31 AM 4.93 (H) 0.44 - 1.00 mg/dL Final  06/14/2022 06:31 AM 4.99 (H) 0.44 - 1.00 mg/dL Final  06/13/2022 03:26 AM 4.93 (H) 0.44  - 1.00 mg/dL Final  06/12/2022 06:44 AM 5.00 (H) 0.44 - 1.00 mg/dL Final  06/11/2022 01:05 AM 4.82 (H) 0.44 - 1.00 mg/dL Final  06/10/2022 07:47 AM 4.68 (H) 0.44 - 1.00 mg/dL Final  06/09/2022 03:52 AM 4.42 (H) 0.44 - 1.00 mg/dL Final  06/08/2022 06:39 AM 4.42 (H) 0.44 - 1.00 mg/dL Final  06/08/2022 06:39 AM 4.44 (H) 0.44 - 1.00 mg/dL Final  06/07/2022 05:40 AM 4.40 (H) 0.44 - 1.00 mg/dL Final  06/06/2022 04:40 AM 4.19 (H) 0.44 - 1.00 mg/dL Final  06/05/2022 06:51 AM 4.30 (H) 0.44 - 1.00 mg/dL Final  06/05/2022 06:51 AM 4.21 (H) 0.44 - 1.00 mg/dL Final  06/04/2022 04:49 AM 4.11 (H) 0.44 - 1.00 mg/dL Final  06/03/2022 07:37 AM 4.10 (H) 0.44 - 1.00 mg/dL Final  06/02/2022 03:37 AM 4.10 (H) 0.44 - 1.00 mg/dL Final  06/02/2022 03:11 AM 4.05 (H) 0.44 - 1.00 mg/dL Final  04/24/2022 08:09 PM 3.15 (H) 0.44 - 1.00 mg/dL Final  04/22/2022 12:00 AM 3.0 (A) 0.5 - 1.1 Final  04/08/2022 04:57 AM 3.50 (H) 0.44 - 1.00 mg/dL Final  04/07/2022 05:37 AM 3.47 (H) 0.44 - 1.00 mg/dL Final  04/06/2022 06:15 AM 3.27 (H) 0.44 - 1.00 mg/dL Final  04/05/2022 05:21 AM 3.33 (H) 0.44 - 1.00 mg/dL Final  04/04/2022 04:54 AM 3.33 (H) 0.44 - 1.00 mg/dL Final  04/03/2022 05:29 AM 3.28 (  H) 0.44 - 1.00 mg/dL Final  04/02/2022 04:46 AM 3.25 (H) 0.44 - 1.00 mg/dL Final  04/01/2022 04:17 AM 3.36 (H) 0.44 - 1.00 mg/dL Final  03/31/2022 05:29 AM 3.34 (H) 0.44 - 1.00 mg/dL Final  03/30/2022 05:07 AM 3.22 (H) 0.44 - 1.00 mg/dL Final  03/29/2022 04:30 AM 3.24 (H) 0.44 - 1.00 mg/dL Final  03/28/2022 05:26 AM 3.31 (H) 0.44 - 1.00 mg/dL Final  03/27/2022 02:14 AM 3.06 (H) 0.44 - 1.00 mg/dL Final  01/02/2022 04:10 AM 5.45 (H) 0.44 - 1.00 mg/dL Final  01/01/2022 03:51 AM 5.63 (H) 0.44 - 1.00 mg/dL Final  12/31/2021 04:22 AM 5.68 (H) 0.44 - 1.00 mg/dL Final  12/30/2021 04:14 AM 5.59 (H) 0.44 - 1.00 mg/dL Final  12/29/2021 03:47 AM 5.53 (H) 0.44 - 1.00 mg/dL Final  12/28/2021 04:00 AM 5.42 (H) 0.44 - 1.00 mg/dL Final  12/27/2021  04:25 AM 5.36 (H) 0.44 - 1.00 mg/dL Final  12/26/2021 04:04 AM 4.55 (H) 0.44 - 1.00 mg/dL Final  12/25/2021 03:42 AM 5.42 (H) 0.44 - 1.00 mg/dL Final  12/24/2021 03:30 AM 5.30 (H) 0.44 - 1.00 mg/dL Final  12/23/2021 01:51 AM 5.09 (H) 0.44 - 1.00 mg/dL Final  12/22/2021 12:10 AM 4.77 (H) 0.44 - 1.00 mg/dL Final  12/19/2021 10:20 AM 4.35 (H) 0.57 - 1.00 mg/dL Final  11/23/2021 05:19 AM 4.36 (H) 0.44 - 1.00 mg/dL Final  11/22/2021 04:39 AM 4.35 (H) 0.44 - 1.00 mg/dL Final  11/21/2021 04:51 AM 4.74 (H) 0.44 - 1.00 mg/dL Final  11/20/2021 05:10 AM 4.51 (H) 0.44 - 1.00 mg/dL Final  11/19/2021 04:15 PM 4.55 (H) 0.44 - 1.00 mg/dL Final  11/17/2021 02:50 AM 4.39 (H) 0.44 - 1.00 mg/dL Final   Creat  Date/Time Value Ref Range Status  11/06/2021 04:17 PM 4.06 (H) 0.50 - 1.05 mg/dL Final  08/27/2021 09:56 AM 1.98 (H) 0.50 - 1.05 mg/dL Final  04/30/2021 11:12 AM 1.41 (H) 0.50 - 1.05 mg/dL Final   PMH:   Past Medical History:  Diagnosis Date   Chronic combined systolic and diastolic congestive heart failure ([place]) 11/09/2021   Generalized anxiety disorder    Hyperlipidemia 11/09/2021   Hypertension    Stage 3b chronic kidney disease (CKD) ([place]) 11/09/2021    PSH:   Past Surgical History:  Procedure Laterality Date   CATARACT EXTRACTION, BILATERAL     combined systolic and diastolic congestive heart failure       Allergies:  Allergies  Allergen Reactions   Crestor [Rosuvastatin] Other (See Comments)    "Felt like she couldn't swallow"    Medications:   Prior to Admission medications   Medication Sig Start Date End Date Taking? Authorizing Provider  b complex-vitamin c-folic acid (NEPHRO-VITE) 0.8 MG TABS tablet Take 1 tablet by mouth at bedtime. 07/24/22  Yes [name], [name], NP  carvedilol (COREG) 6.25 MG tablet Take 1 tablet (6.25 mg total) by mouth 2 (two) times daily with a meal. Patient taking differently: Take 12.5 mg by mouth 2 (two) times daily with a meal. 06/17/22 08/14/22 Yes  Ogbata, Babs Bertin, MD  cholecalciferol (VITAMIN D3) 25 MCG (1000 UNIT) tablet Take 1,000 Units by mouth daily.   Yes [provider]  Docusate Sodium (STOOL SOFTENER LAXATIVE PO) Take 1 capsule by mouth as needed (constipation).   Yes [provider]  feeding supplement (ENSURE ENLIVE / ENSURE PLUS) LIQD Take 237 mLs by mouth 2 (two) times daily between meals. 06/17/22  Yes Bonnell Public, MD  ferrous sulfate 325 (  65 FE) MG tablet Take 1 tablet (325 mg total) by mouth daily. 07/24/22 01/20/23 Yes [name], [name], NP  hydrALAZINE (APRESOLINE) 50 MG tablet TAKE 1 TABLET(50 MG) BY MOUTH EVERY 8 HOURS Patient taking differently: Take 50 mg by mouth 3 (three) times daily. 07/25/22  Yes [name], [name], NP  Menthol-Methyl Salicylate (MUSCLE RUB) 10-15 % CREA Apply 1 Application topically as needed for muscle pain.   Yes [provider]  Multiple Vitamin (MULTIVITAMIN ADULT PO) Take 1 tablet by mouth daily.   Yes [provider]  potassium chloride (MICRO-K) 10 MEQ CR capsule Take 10 mEq by mouth daily.   Yes [provider]  sevelamer carbonate (RENVELA) 800 MG tablet Take 800 mg by mouth 3 (three) times daily. 07/10/22  Yes [provider]  torsemide (DEMADEX) 10 MG tablet TAKE 5 TABLETS (50 MG TOTAL) BY MOUTH 2 (TWO) TIMES DAILY. 07/24/22 08/23/22 Yes [name], [name], NP  Vitamin D, Ergocalciferol, (DRISDOL) 1.25 MG (50000 UNIT) CAPS capsule Take 50,000 Units by mouth once a week. 05/30/22  Yes [provider]  losartan (COZAAR) 25 MG tablet Take 25 mg by mouth daily. Patient not taking: Reported on 08/14/2022 07/14/22   [provider]    Inpatient medications:  carvedilol  6.25 mg Oral BID WC   ferrous sulfate  325 mg Oral Daily   heparin  5,000 Units Subcutaneous Q8H   hydrALAZINE  50 mg Oral TID   sevelamer carbonate  800 mg Oral TID with meals    Discontinued Meds:   Medications Discontinued During This Encounter   Medication Reason   nitroGLYCERIN 50 mg in dextrose 5 % 250 mL (0.2 mg/mL) infusion     Social History:  reports that she has never smoked. She has never used smokeless tobacco. She reports that she does not currently use alcohol. She reports that she does not use drugs.  Family History:   Family History  Problem Relation Age of Onset   Throat cancer Father     Pertinent items are noted in HPI. Weight change:   Intake/Output Summary (Last 24 hours) at 08/15/2022 1254 Last data filed at 08/15/2022 1134 Gross per 24 hour  Intake 410 ml  Output 825 ml  Net -415 ml   BP 116/62   Pulse (!) 57   Temp 97.7 F (36.5 C) (Oral)   Resp 10   Ht _0  (1.651 m)   Wt 83 kg   SpO2 100%   BMI 30.45 kg/m  Vitals:   08/15/22 1115 08/15/22 1130 08/15/22 1145 08/15/22 1152  BP: (!) 143/68 134/65 116/62   Pulse: 62 61 (!) 57   Resp: _1 Temp:    97.7 F (36.5 C)  TempSrc:    Oral  SpO2: 100% 100% 100%   Weight:      Height:         General appearance: alert, cooperative, fatigued, and no distress Head: Normocephalic, without obvious abnormality, atraumatic Resp: diminished breath sounds bibasilar Cardio: regular rate and rhythm, S1, S2 normal, no murmur, click, rub or gallop GI: soft, non-tender; bowel sounds normal; no masses,  no organomegaly Extremities: edema 2+ bilateral lower extremities  Labs: Basic Metabolic Panel: Recent Labs  Lab 08/14/22 1930 08/15/22 0330  NA 145 143  K 4.5 3.9  CL 113* 115*  CO2 17* 19*  GLUCOSE 67* 78  BUN 109* 107*  CREATININE 5.14* 5.20*  ALBUMIN 2.1* 1.7*  CALCIUM 7.8* 7.4*  PHOS  --  8.5*   Liver Function Tests: Recent Labs  Lab 08/14/22 1930 08/15/22 0330  AST 35 27  ALT 27 21  ALKPHOS 114 101  BILITOT 0.7 0.3  PROT 6.3* 5.3*  ALBUMIN 2.1* 1.7*   Recent Labs  Lab 08/14/22 1930  LIPASE 35   No results for input(s): "AMMONIA" in the last 168 hours. CBC: Recent Labs  Lab 08/14/22 1930 08/15/22 0330  WBC 4.6  4.5  NEUTROABS 2.9 2.8  HGB 9.0* 7.2*  HCT 27.7* 23.6*  MCV 92.3 95.2  PLT 160 150   PT/INR: _0 (inr:5) Cardiac Enzymes: )No results for input(s): "CKTOTAL", "CKMB", "CKMBINDEX", "TROPONINI" in the last 168 hours. CBG: Recent Labs  Lab 08/14/22 2300 08/15/22 0027 08/15/22 0130 08/15/22 0333 08/15/22 0739  GLUCAP 67* 97 84 74 65*    Iron Studies:  Recent Labs  Lab 08/15/22 0330  IRON 62  TIBC 157*  FERRITIN 438*    Xrays/Other Studies: DG Chest Portable 1 View  Result Date: 08/14/2022 CLINICAL DATA:  Shortness of breath EXAM: PORTABLE CHEST 1 VIEW COMPARISON:  06/10/2022 FINDINGS: Cardiomegaly, vascular congestion. Mild perihilar and lower lobe opacities could reflect early edema. Small bilateral effusions. No acute bony abnormality. IMPRESSION: Cardiomegaly with vascular congestion and possible early pulmonary edema. Small bilateral effusions. Electronically Signed   By: Rolm Baptise M.D.   On: 08/14/2022 20:15     Assessment/Plan:  AKI/CKD stage V - in setting of anasarca and acute on chronic CHF.  She has responded to high dose IV lasix and is feeling better today.  Scr up slightly.  Continue with IV lasix 120 mg bid and follow.  She does not want dialysis and is currently without uremic symptoms.   Acute on chronic diastolic CHF - as above, responding to IV lasix.  Continue with current dose and frequency. Hypertensive emergency - improved with home meds and diuresis.  Off nitro gtt. Chronic urinary retention - has chronic indwelling foley catheter. Nephrotic syndrome - unfortunately she never had a kidney biopsy when renal function was better.  I am not sure a biopsy at this time with advanced CKD for the past year would yield any answers or change current therapy.  Also has high risk of bleeding. Monoclonal IgG with kappa light chain - bone marrow biopsy last year with 7% plasma cells and light congo red staining but ultimately nondiagnostic.  Will recheck  SPEP/UPEP to see if it has progressed.  Still possibly due to AL amyloidosis. History of hydronephrosis L>R - will repeat renal US.   Elevated TSH - recommend checking free T3 and T4 to r/o hypothyroid myxedema.   Governor Rooks Bayden Gil 08/15/2022, 12:54 PM

## 2022-08-15 NOTE — Progress Notes (Signed)
Progress Note   Patient: Kara Mullins SAY:[id number] DOB: Mar 15, 1951 DOA: 08/14/2022     1 DOS: the patient was seen and examined on 08/15/2022 at 10:23AM      Brief hospital course: Kara Mullins is a 71 y.o. F with CKD V not on HD, wheelchair bound, chronic indwelling foley due to urine retention, sdCHF most recent EF 55%, pAF not on AC, and history of IgG kappa monoclonal protein who presented with acute on chronic swelling and dyspnea over last few days.  In the ER, BP 190/99 and very edematous.  CXR showed bilateral effusions and edema.  She was given Lasix 100 mg IV and admitted for further work up.  Cr 5.14 mg/dL, consistent with her most recent values 2 months ago.    11/1: Admitted on Lasix 11/2: Nephrology consulted     Assessment and Plan: * Acute on chronic diastolic heart failure (HCC) Negative 775 overnight with Lasix 100 IV.  Cr slightly up, K stable.  Echo 2 months ago showed EF up to 55%, grade I DD -Furosemide 120 mg IV twice a day  -MOnitor K -Strict I/Os, daily weights, telemetry  -Daily monitoring renal function -Consult Nephrology      CKD (chronic kidney disease) stage 5, GFR less than 15 ml/min (HCC) Baseline creatinine ranging 1.3-1.6 in mid 2022, had an episode of CIN that year, but Cr stayed <2.  Monoclonal IgG kappa noted, bone marrow biopsy with 7% plasma cells, but no overt MM. No hematology follow up ever.    Prior Nephrology notes suggest not considered a reasonable HD candidate.   - Check urine protein - Strict I/Os - Continue diuresis - Consult Nephrology for ? Do they agree palliative Lasix and Hospice is our only option      Hypertensive emergency Admitted with BP >190 and CHF.  Nitro gtt started overnight - Continue taper nitroglycerin drip - Continue Lasix, hydralazine - Resume home Coreg, attn low HR - Appreciate Nephrology input  Pyuria Expected with indwelling foley.  No treatment necessary.  Increased thyroid  stimulating hormone (TSH) level - Repeat in 4 weeks pending goals of care  Hypoglycemia Resolved  Prolonged QT interval    Normocytic anemia Longstanding.  Probably from chronic kidney disease.  Prior work up for MM was inconclusive.  It looks like her SNF [facility] sent a referral to Hem[facility] in 2023, but family canceled it "[Daughter wanted] to know if it is ok NOT to take patient to the [facility]. Stated that it is hard for the patient and if they cannot do anything for her, she wants to keep her comfortable."  Hgb here 7.2, no clinical bleeding - Appreciate Nephrology recommendations - Continue iron  Essential hypertension See above           Subjective: Feeling "slightly better" still very swollen.  Tired.  No fever. No pain at foley.  No confusion.     Physical Exam: BP 134/65   Pulse 61   Temp 97.8 F (36.6 C) (Oral)   Resp 12   Ht _0  (1.651 m)   Wt 83 kg   SpO2 100%   BMI 30.45 kg/m   Elderly adult female, lying in bed, no acute distress, appears tired, right eye subconj hemorrhage RRR, systolic murmur, 3+ LE edema pitting bilaterally, JV not visible due to habitus Respiratory rate increased, appears slightly out of breath, talks in short sentences, diminished at both bases, rales noted at bases Abdomen soft without tenderness or guarding or distension  or ascites Attention normal, affect tired, EOM intact, speech fluent, generalized weaknes but symmetric strength   Data Reviewed: Communicated with Nephrology via secure chat Troponins minimally elevated, given renal function, not clinically significant. Hgb down to 7.2, WBC normal, platelets normal Procal normal Cr 5.2, slightly up from yesterday/stable.  BUN >100.  K normal.    Family Communication: None present    Disposition: Status is: Inpatient         Author: Edwin Dada, MD 08/15/2022 11:49 AM  For on call review [email or web address].

## 2022-08-15 NOTE — ED Notes (Signed)
Awake, interactive, calm, NAD, states, "feel better, can breathe better".

## 2022-08-15 NOTE — Assessment & Plan Note (Addendum)
See above

## 2022-08-15 NOTE — ED Notes (Signed)
Pt placed on a hospital bed

## 2022-08-15 NOTE — Assessment & Plan Note (Signed)
-   Repeat in 4 weeks pending goals of care

## 2022-08-15 NOTE — Assessment & Plan Note (Signed)
BMI 30.4 

## 2022-08-15 NOTE — Assessment & Plan Note (Signed)
Resolved

## 2022-08-15 NOTE — Consult Note (Signed)
Consultation Note Date: 08/15/2022   Patient Name: Kara Mullins  DOB: October 28, 1950  MRN: [id number]  Age / Sex: 71 y.o., female  PCP: [name], [name], NP Referring Physician: Edwin Dada, *  Reason for Consultation: Establishing goals of care  HPI/Patient Profile: 71 y.o. female  with past medical history of chronic diastolic heart failure, stage V CKD with baseline creatinine 3.5-5.0, anemia of chronic kidney disease with baseline hemoglobin range 7-9, hypertension, chronic Foley catheter, admitted on 08/14/2022 with worsening shortness of breath.   Patient admitted for acute on chronic diastolic heart failure exacerbation and AKI on CKD 5.  PMT has been consulted to assist with goals of care conversation.  Clinical Assessment and Goals of Care:  I have reviewed medical records including EPIC notes, labs and imaging, received report from RN, assessed the patient and then met at the bedside to discuss diagnosis prognosis, GOC, EOL wishes, disposition and options.  I introduced Palliative Medicine as specialized medical care for people living with serious illness. It focuses on providing relief from the symptoms and stress of a serious illness. The goal is to improve quality of life for both the patient and the family.  We discussed a brief life review of the patient and then focused on their current illness.   I attempted to elicit values and goals of care important to the patient.    Medical History Review and Understanding:  Patient states she understands the severity of her illness and that "I do not care what any doctor says when they walk in here, God has the final say."  Social History: Patient lives with her daughter and son-in-law.  She reports that they alternate caregiving shifts.  She is sad at times as she feels like she is a burden, despite her daughter telling her otherwise.  She enjoys  spending time with her 2 grandchildren and reflecting on good memories.  Her second daughter died in 19-Dec-2018.  Functional and Nutritional State: Patient reports she is able to "scoot to wheelchair" and only can tolerate a few minutes of ambulating with a walker.  This has been the case since March.  She reports her appetite is all right.  Palliative Symptoms: Leg pain  Advance Directives: A detailed discussion regarding advanced directives was had.  HCPOA is daughter [name] and on file, see Vynca.  Living will has been crossed out.   Code Status: Concepts specific to code status, artifical feeding and hydration, and rehospitalization were considered and discussed.   Discussion: Emotional support and therapeutic listening was provided as patient shares her story of "giving up" last year during a serious illness and learning that God has more in-store for her throughout her recovery since then.  She tells me she will never do that again.  She understands the team's concern about her prognosis and states this does not worry her.  She has been through worse before.   Her quality of life would be much better if she could walk again and she is still hopeful for this.  She has terrible leg pain.  Other goals include being a light to others and helping someone in need.  She feels that if she can do this, any suffering or difficult experiences will be worth it.  She is adamant that her story will be one of "miracle, science, and wonders."  Her faith drives her decision-making process and also helps her cope through her illness.  She would like to continue with palliative care and is agreeable  to this PA calling her daughter for support.  I then spoke with [name] by phone and reviewed the above conversation.  This is her understanding of patient's wishes as well and she is supportive.   Discussed the importance of continued conversation with family and the medical providers regarding overall plan of care and  treatment options, ensuring decisions are within the context of the patient's values and GOCs.   Questions and concerns were addressed.  Hard Choices booklet left for review. The family was encouraged to call with questions or concerns.  PMT will continue to support holistically.   SUMMARY OF RECOMMENDATIONS   -Continue full code/full scope treatment -Patient is not ready for end-of-life, goal is to be able to say God healed her and have the strength to walk again and help others -Psychosocial and emotional support provided -PMT will continue to follow and support  Prognosis:  Unable to determine  Discharge Planning: To Be Determined      Primary Diagnoses: Present on Admission:  Acute on chronic diastolic heart failure (HCC)  Hypoglycemia  Increased thyroid stimulating hormone (TSH) level  Pyuria  Prolonged QT interval  CKD (chronic kidney disease) stage 5, GFR less than 15 ml/min (HCC)  Essential hypertension  Physical Exam Vitals and nursing note reviewed.  Cardiovascular:     Rate and Rhythm: Bradycardia present.  Pulmonary:     Effort: Pulmonary effort is normal. No respiratory distress.  Skin:    General: Skin is dry.  Neurological:     Mental Status: She is alert and oriented to person, place, and time.  Psychiatric:        Mood and Affect: Mood normal.    Vital Signs: BP 116/62   Pulse (!) 57   Temp 97.9 F (36.6 C) (Oral)   Resp 10   Ht _0  (1.651 m)   Wt 83 kg   SpO2 100%   BMI 30.45 kg/m  Pain Scale: PAINAD   Pain Score: 5    SpO2: SpO2: 100 % O2 Device:SpO2: 100 % O2 Flow Rate: .   Palliative Assessment/Data: 20-30%     MDM: high   Marshon Bangs Johnnette Litter, [name]  Palliative Medicine Team Team phone # [phone]  Thank you for allowing the Palliative Medicine Team to assist in the care of this patient. Please utilize secure chat with additional questions, if there is no response within 30 minutes please call the above phone  number.  Palliative Medicine Team providers are available by phone from 7am to 7pm daily and can be reached through the team cell phone.  Should this patient require assistance outside of these hours, please call the patient's attending physician.

## 2022-08-15 NOTE — ED Notes (Signed)
Pt off the [employer].

## 2022-08-15 NOTE — ED Notes (Signed)
Palliative care PA at [facility]

## 2022-08-15 NOTE — Assessment & Plan Note (Addendum)
Has known subclinical hypothyroidism.  TSH 6, fT4 normal.  My understanding is that treatment of a 72 year old in that context is controversial, and although heart failure might militate in favor of treatment, her fluid overload is largely driven by her kidney failure, which LT4 wouldn't help, and it's hard to gauge the balance of benefit and harm with her limited prognosis.

## 2022-08-16 ENCOUNTER — Inpatient Hospital Stay (HOSPITAL_COMMUNITY)
Admission: RE | Admit: 2022-08-16 | Discharge: 2022-08-16 | Disposition: A | Discharge status: Home / Self Care | Payer: Medicare (Managed Care) | Source: Ambulatory Visit | Attending: Nephrology | Admitting: Nephrology

## 2022-08-16 DIAGNOSIS — I161 Hypertensive emergency: Secondary | ICD-10-CM | POA: Diagnosis not present

## 2022-08-16 DIAGNOSIS — N185 Chronic kidney disease, stage 5: Secondary | ICD-10-CM | POA: Diagnosis not present

## 2022-08-16 DIAGNOSIS — I5033 Acute on chronic diastolic (congestive) heart failure: Secondary | ICD-10-CM | POA: Diagnosis not present

## 2022-08-16 DIAGNOSIS — I10 Essential (primary) hypertension: Secondary | ICD-10-CM | POA: Diagnosis not present

## 2022-08-16 LAB — RENAL FUNCTION PANEL
Albumin: 1.8 g/dL — ABNORMAL LOW (ref 3.5–5.0)
Anion gap: 14 (ref 5–15)
BUN: 110 mg/dL — ABNORMAL HIGH (ref 8–23)
CO2: 18 mmol/L — ABNORMAL LOW (ref 22–32)
Calcium: 7.4 mg/dL — ABNORMAL LOW (ref 8.9–10.3)
Chloride: 110 mmol/L (ref 98–111)
Creatinine, Ser: 5.23 mg/dL — ABNORMAL HIGH (ref 0.44–1.00)
GFR, Estimated: 8 mL/min — ABNORMAL LOW (ref >60)
Glucose, Bld: 68 mg/dL — ABNORMAL LOW (ref 70–99)
Phosphorus: 8.6 mg/dL — ABNORMAL HIGH (ref 2.5–4.6)
Potassium: 4 mmol/L (ref 3.5–5.1)
Sodium: 142 mmol/L (ref 135–145)

## 2022-08-16 LAB — GLUCOSE, CAPILLARY
Glucose-Capillary: 63 mg/dL — ABNORMAL LOW (ref 70–99)
Glucose-Capillary: 90 mg/dL (ref 70–99)

## 2022-08-16 LAB — CBC
HCT: 22.6 % — ABNORMAL LOW (ref 36.0–46.0)
Hemoglobin: 7.5 g/dL — ABNORMAL LOW (ref 12.0–15.0)
MCH: 30.2 pg (ref 26.0–34.0)
MCHC: 33.2 g/dL (ref 30.0–36.0)
MCV: 91.1 fL (ref 80.0–100.0)
Platelets: 139 10*3/uL — ABNORMAL LOW (ref 150–400)
RBC: 2.48 MIL/uL — ABNORMAL LOW (ref 3.87–5.11)
RDW: 16.6 % — ABNORMAL HIGH (ref 11.5–15.5)
WBC: 4.3 10*3/uL (ref 4.0–10.5)
nRBC: 0 % (ref 0.0–0.2)

## 2022-08-16 MED ORDER — HYDRALAZINE HCL 20 MG/ML IJ SOLN: 5.0000 mg | Freq: Four times a day (QID) | INTRAMUSCULAR | Status: DC | PRN

## 2022-08-16 NOTE — Progress Notes (Signed)
Daily Progress Note   Patient Name: Kara Mullins       Date: 08/16/2022 DOB: 01-22-1951  Age: 71 y.o. MRN#: [id number] Attending Physician: Edwin Dada, * Primary Care Physician: [name], NP Admit Date: 08/14/2022  Reason for Consultation/Follow-up: Establishing goals of care  Subjective: Medical records reviewed including progress notes, labs. Patient assessed at the bedside.  She reports feeling better than yesterday. She tells me her daughter has just left to take care of some errands.  Created space and opportunity for patient's thoughts and feelings on current illness.  She correlates her prayers to positive results she has seen today.  She adds that even her doctor seems to have been amazed by her body's response to Lasix.  Goals remain clear to continue improving and return home in the next few days.  We discussed her outpatient palliative care team, which she did not initially remember, and she is agreeable to continuing this.  Encouraged her to call with any other needs.  Questions and concerns addressed. PMT will continue to support holistically.   Length of Stay: 2   Physical Exam Vitals and nursing note reviewed.  Cardiovascular:     Rate and Rhythm: Bradycardia present.  Pulmonary:     Effort: Pulmonary effort is normal.  Neurological:     Mental Status: She is alert and oriented to person, place, and time.  Psychiatric:        Mood and Affect: Mood normal.        Behavior: Behavior normal.            Vital Signs: BP 136/69 (BP Location: Right Arm)   Pulse (!) 57   Temp (!) 97.4 F (36.3 C) (Oral)   Resp 16   Ht 5\' 5"  (1.651 m)   Wt 82.6 kg   SpO2 100%   BMI 30.30 kg/m  SpO2: SpO2: 100 % O2 Device: O2 Device: Room Air O2 Flow Rate:         Palliative Assessment/Data: 40%   Palliative Care Assessment & Plan   Patient Profile: 71 y.o. female  with past medical history of chronic diastolic heart failure, stage V CKD with baseline creatinine 3.5-5.0, anemia of chronic kidney disease with baseline hemoglobin range 7-9, hypertension, chronic Foley catheter, admitted on 08/14/2022 with worsening shortness of breath.  Patient admitted for acute on chronic diastolic heart failure exacerbation and AKI on CKD 5.  PMT has been consulted to assist with goals of care conversation.  Assessment: Acute on chronic diastolic heart failure AKI on stage V chronic kidney disease Chronic Foley catheter Hypertension Goals of care conversation  Recommendations/Plan: Continue full code/full scope treatment Continue outpatient palliative care on discharge Psychosocial emotional support provided PMT remains available as needed.  Please secure chat or call team line should urgent needs arise.   Prognosis:  Unable to determine  Discharge Planning: Home with Palliative Services  Care plan was discussed with patient   MDM high         Wyett Narine Johnnette Litter, [name]  Palliative Medicine Team Team phone # [phone]  Thank you for allowing the Palliative Medicine Team to assist in the care of this patient. Please utilize secure chat with additional questions, if there is no response within 30 minutes please call the above phone number.  Palliative Medicine Team providers are available by phone from 7am to 7pm daily and can be reached through the team cell phone.  Should this patient require assistance outside of these hours, please call the patient's attending physician.

## 2022-08-16 NOTE — Progress Notes (Signed)
[facility] Progress Note   Assessment/ Plan:    AKI/CKD stage V - in setting of anasarca and acute on chronic CHF.  She has responded to high dose IV lasix and is feeling better today.  Scr up slightly.  Continue with IV lasix 120 mg bid and follow.  She does not want dialysis and is currently without uremic symptoms.    - will continue with IV Lasix- anticipate 24-48 more hours depending Acute on chronic diastolic CHF - as above, responding to IV lasix.  Continue with current dose and frequency. Hypertensive emergency - improved with home meds and diuresis.  back on nitro gtt this AM Chronic urinary retention - has chronic indwelling foley catheter. Nephrotic syndrome - unfortunately she never had a kidney biopsy when renal function was better.  I am not sure a biopsy at this time with advanced CKD for the past year would yield any answers or change current therapy.  Also has high risk of bleeding. Monoclonal IgG with kappa light chain - bone marrow biopsy last year with 7% plasma cells and light congo red staining but ultimately nondiagnostic.  Will recheck SPEP/UPEP to see if it has progressed.  Still possibly due to AL amyloidosis. History of hydronephrosis L>R - will repeat renal US.   Elevated TSH - recommend checking free T3 and T4 to r/o hypothyroid myxedema.  Subjective:    Diuresing today.  Cr 5.2, no uremic symptoms.  Tells me that she does not want dialysis   Objective:   BP (!) 170/89   Pulse 66   Temp (!) 97.4 F (36.3 C) (Oral)   Resp 16   Ht _0  (1.651 m)   Wt 82.6 kg   SpO2 100%   BMI 30.30 kg/m   Intake/Output Summary (Last 24 hours) at 08/16/2022 1329 Last data filed at 08/16/2022 0423 Gross per 24 hour  Intake 171.27 ml  Output 650 ml  Net -478.73 ml   Weight change: -0.408 kg  Physical Exam: Gen: NAD, sitting in bed CVS: RRR Resp: some faint bibasilar crackles Abd: soft Ext: 1+ LE edema  Imaging: US RENAL  Result Date:  08/15/2022 CLINICAL DATA:  Acute renal injury EXAM: RENAL / URINARY TRACT ULTRASOUND COMPLETE COMPARISON:  None Available. FINDINGS: Right Kidney: Renal measurements: 11.2 x 4.1 x 7.0 cm = volume: 168 mL. Moderate hydronephrosis. Renal pelvis dilated. Left Kidney: Renal measurements: 9.9 x 5.5 x 6.4 cm = volume: 180 mL. Moderate hydronephrosis. Renal pelvis dilated Bladder: Bladder collapsed around Foley catheter. Other: None. IMPRESSION: 1. Moderate bilateral hydronephrosis. 2. Foley catheter within collapsed bladder Electronically Signed   By: Suzy Bouchard M.D.   On: 08/15/2022 15:17   DG Chest Portable 1 View  Result Date: 08/14/2022 CLINICAL DATA:  Shortness of breath EXAM: PORTABLE CHEST 1 VIEW COMPARISON:  06/10/2022 FINDINGS: Cardiomegaly, vascular congestion. Mild perihilar and lower lobe opacities could reflect early edema. Small bilateral effusions. No acute bony abnormality. IMPRESSION: Cardiomegaly with vascular congestion and possible early pulmonary edema. Small bilateral effusions. Electronically Signed   By: Rolm Baptise M.D.   On: 08/14/2022 20:15    Labs: BMET Recent Labs  Lab 08/14/22 1930 08/15/22 0330 08/16/22 0444  NA 145 143 142  K 4.5 3.9 4.0  CL 113* 115* 110  CO2 17* 19* 18*  GLUCOSE 67* 78 68*  BUN 109* 107* 110*  CREATININE 5.14* 5.20* 5.23*  CALCIUM 7.8* 7.4* 7.4*  PHOS  --  8.5* 8.6*   CBC Recent Labs  Lab 08/14/22 1930 08/15/22 0330 08/15/22 1819 08/16/22 0444  WBC 4.6 4.5 4.9 4.3  NEUTROABS 2.9 2.8  --   --   HGB 9.0* 7.2* 7.8* 7.5*  HCT 27.7* 23.6* 24.7* 22.6*  MCV 92.3 95.2 94.3 91.1  PLT 160 150 148* 139*    Medications:     carvedilol  6.25 mg Oral BID WC   Chlorhexidine Gluconate Cloth  6 each Topical Daily   ferrous sulfate  325 mg Oral Daily   heparin  5,000 Units Subcutaneous Q8H   hydrALAZINE  50 mg Oral TID   sevelamer carbonate  800 mg Oral TID with meals    Madelon Lips, MD 08/16/2022, 1:29 PM

## 2022-08-16 NOTE — Consult Note (Signed)
   [facility] CM Inpatient Consult   08/16/2022  [name] [birth year] [id number]   [facility] [ACO] Patient: Kara Mullins [employer]   Primary Care Provider:  [name], NP with [facility] who listed to provide the transition of care follow up  Patient is currently active with [facility] [employer] for community care coordination services.  Patient has been engaged by a [facility].  Our community based plan of care has focused on disease management and community resource support.   Met with patient at the sitting on side of bed, daughter [name], has gone home to freshen up, regarding post hospital needs.  Patient states she is still trying to get the stedy type equipment for mobility and safety concerns.  She states, "I feel a sense of security with it and I can get about as I am mostly just standing and not walking."  Patient states the hoyer lift has been picked up and she has [facility] for home health still.  She inquired about getting her finances and things in order with her daughter. Encouraged her to speak with her banker on their particular polices regarding next-of-kin verses executor, etc.  Patient demonstrates good insight to ongoing needs for Advanced Directives, Living Will and ongoing advanced planning needs. Patient had concerns regarding an injection that was scheduled for administration today prior to admission.  She wanted to see if it could be given while hospitalized, states, "I believe the nephrologist had ordered it for my hemoglobin."  Spoke with unit nursing, Dellis Filbert and states he will follow up with MD.   Plan: Spoke with patient regarding post hospital follow up for advanced planning if not done prior to transitioning home. Made by Inpatient Transition Of Care [TOC] team member to make aware that [facility] following.   Of note, [facility] Care Management services  does not replace or interfere with any services that are needed or arranged by inpatient [facility] care management team.   For additional questions or referrals please contact:  Natividad Brood, RN BSN [place]  [phone] business mobile phone Toll free office [phone]  *[place]  [phone] Fax number: [phone] [email or web address]

## 2022-08-16 NOTE — Progress Notes (Signed)
  Progress Note   Patient: Kara Mullins XAJ:[id number] DOB: 1950/10/28 DOA: 08/14/2022     2 DOS: the patient was seen and examined on 08/16/2022 at 8:31AM      Brief hospital course: Mrs. Kujala is a 71 y.o. F with CKD V not on HD, wheelchair bound, chronic indwelling foley due to urine retention, sdCHF most recent EF 55%, pAF not on AC, and history of IgG kappa monoclonal protein who presented with acute on chronic swelling and dyspnea over last few days.  In the ER, BP 190/99 and very edematous.  CXR showed bilateral effusions and edema.  She was given Lasix 100 mg IV and admitted for further work up.  Cr 5.14 mg/dL, consistent with her most recent values 2 months ago.    11/1: Admitted on Lasix 11/2: Nephrology consulted     Assessment and Plan: * Acute on chronic diastolic heart failure ([place]) Net even overnight, no response to IV Lasix, although patient claims she "made a lot of urine".  Perhaps nursing didn't document it, as she appears much less edematous.  Cr and K stable  Echo 2 months ago showed EF up to 55%, grade I DD - Continue Furosemide 120 mg IV twice a day  - Monitor K - Strict I/Os, daily weights, telemetry  - Daily monitoring renal function - Consult Nephrology      CKD (chronic kidney disease) stage 5, GFR less than 15 ml/min (HCC) See prior summary Cr no change - Follow SPEP, urine immunofixation - Strict I/Os - Continue diuresis   Hypertensive emergency BP well controlled - Stop nitro drip - Continue Lasix, Coreg - Continue hydralazine - Will add amlodipine if needed   Subclinical hypothyroidism Has known subclinical hypothyroidism.  TSH 6, fT4 normal.  My understanding is that treatment of a 71 year old in that context is controversial, and although heart failure might militate in favor of treatment, her fluid overload is largely driven by her kidney failure, which LT4 wouldn't help, and it's hard to gauge the balance of benefit and harm  with her limited prognosis.      Normocytic anemia Patient due for outpatient EPO. - EPO per Nephrology          Subjective: Patient is feeling better, she is somewhat stronger.  No dyspnea, chest pain, palpitations.  No fever.     Physical Exam: BP (!) 170/89   Pulse 66   Temp (!) 97.4 F (36.3 C) (Oral)   Resp 16   Ht 5\' 5"  (1.651 m)   Wt 82.6 kg   SpO2 100%   BMI 30.30 kg/m   Elderly adult female, lying in bed, no acute distress RRR, I do not appreciate a systolic murmur today, no JVD, actually no pitting in the lower extremities today, swelling looks improved Respiratory rate normal, no rales today No wheezing Abdomen soft without tenderness palpation or guarding Attention normal, affect somewhat constrained and sad, speech fluent, face symmetric, moves upper extremities with generalized weakness but symmetric strength    Data Reviewed: Creatinine 5.23, no change Bicarb 18, no change, BUN slightly up to 110 SPEP pending Protein creatinine ratio 17 Ultrasound shows bilateral hydronephrosis, clinical significance unclear as the patient has an indwelling Foley Hemoglobin 7.5, no change from yesterday  Family Communication: Daughter by phone   Disposition: Status is: Inpatient         Author: Edwin Dada, MD 08/16/2022 4:14 PM  For on call review [email or web address].

## 2022-08-16 NOTE — Care Management Important Message (Signed)
Important Message  Patient Details  Name: Kara Mullins MRN: [id number] Date of Birth: 1951/06/03   Medicare Important Message Given:  Yes     Shelda Altes 08/16/2022, 8:55 AM

## 2022-08-17 DIAGNOSIS — I5033 Acute on chronic diastolic (congestive) heart failure: Secondary | ICD-10-CM | POA: Diagnosis not present

## 2022-08-17 DIAGNOSIS — I161 Hypertensive emergency: Secondary | ICD-10-CM | POA: Diagnosis not present

## 2022-08-17 DIAGNOSIS — I10 Essential (primary) hypertension: Secondary | ICD-10-CM | POA: Diagnosis not present

## 2022-08-17 DIAGNOSIS — N185 Chronic kidney disease, stage 5: Secondary | ICD-10-CM | POA: Diagnosis not present

## 2022-08-17 LAB — RENAL FUNCTION PANEL
Albumin: 1.7 g/dL — ABNORMAL LOW (ref 3.5–5.0)
Anion gap: 7 (ref 5–15)
BUN: 106 mg/dL — ABNORMAL HIGH (ref 8–23)
CO2: 18 mmol/L — ABNORMAL LOW (ref 22–32)
Calcium: 7.1 mg/dL — ABNORMAL LOW (ref 8.9–10.3)
Chloride: 113 mmol/L — ABNORMAL HIGH (ref 98–111)
Creatinine, Ser: 5.39 mg/dL — ABNORMAL HIGH (ref 0.44–1.00)
GFR, Estimated: 8 mL/min — ABNORMAL LOW (ref >60)
Glucose, Bld: 98 mg/dL (ref 70–99)
Phosphorus: 8.7 mg/dL — ABNORMAL HIGH (ref 2.5–4.6)
Potassium: 3.8 mmol/L (ref 3.5–5.1)
Sodium: 138 mmol/L (ref 135–145)

## 2022-08-17 LAB — GLUCOSE, CAPILLARY
Glucose-Capillary: 94 mg/dL (ref 70–99)
Glucose-Capillary: 84 mg/dL (ref 70–99)
Glucose-Capillary: 82 mg/dL (ref 70–99)

## 2022-08-17 MED ORDER — DARBEPOETIN ALFA 150 MCG/0.3ML IJ SOSY
150.0000 ug | PREFILLED_SYRINGE | INTRAMUSCULAR | Status: AC
  Administered 2022-08-17: 150 ug via SUBCUTANEOUS
  Filled 2022-08-17: qty 0.3

## 2022-08-17 MED ORDER — SODIUM CHLORIDE 0.9 % IV SOLN: INTRAVENOUS | Status: DC | PRN

## 2022-08-17 NOTE — TOC Progression Note (Signed)
Transition of Care [facility]) - Progression Note    Patient Details  Name: Kara Mullins MRN: [id number] Date of Birth: 10-21-1950  Transition of [facility]) CM/SW Contact  Zenon Mayo, RN Phone Number: 08/17/2022, 3:15 PM  Clinical Narrative:    From home with daughter , bed bound, she has good family support, a/c HF, cont diuresing with iv lasix. TOC following.         Expected Discharge Plan and Services                                                 Social Determinants of Health (SDOH) Interventions    Readmission Risk Interventions    06/17/2022    3:36 PM 06/17/2022    3:34 PM 06/05/2022    4:41 PM  Readmission Risk Prevention Plan  Transportation Screening  Complete Complete  Medication Review Press photographer) Complete Complete Complete  PCP or Specialist appointment within 3-5 days of discharge Complete Complete   HRI or Home Care Consult Complete  Complete  SW Recovery Care/Counseling Consult Complete  Complete  Palliative Care Screening Not Applicable  Not [place] Not Applicable  Not Applicable

## 2022-08-17 NOTE — Progress Notes (Signed)
[facility] Progress Note   Assessment/ Plan:    AKI/CKD stage V - in setting of anasarca and acute on chronic CHF.  She has responded to high dose IV lasix and is feeling better today.  Scr up slightly.  Continue with IV lasix 120 mg bid and follow.  She does not want dialysis and is currently without uremic symptoms.    - will continue with IV Lasix- anticipate 24-48 more hours--> can likely switch to PO torsemide tomorrow Acute on chronic diastolic CHF - as above, responding to IV lasix.  Continue with current dose and frequency. Hypertensive emergency - improved with home meds and diuresis.  now off nitro gtt Chronic urinary retention - has chronic indwelling foley catheter. Nephrotic syndrome - unfortunately she never had a kidney biopsy when renal function was better.  I am not sure a biopsy at this time with advanced CKD for the past year would yield any answers or change current therapy.  Also has high risk of bleeding. Monoclonal IgG with kappa light chain - bone marrow biopsy last year with 7% plasma cells and light congo red staining but ultimately nondiagnostic.  Will recheck SPEP/UPEP to see if it has progressed.  Still possibly due to AL amyloidosis. History of hydronephrosis L>R - will repeat renal US--> hydro unchanged Elevated TSH - recommend checking free T3 and T4 to r/o hypothyroid myxedema.  Subjective:    Cr about the same as yesterday.     Objective:   BP (!) 140/63 (BP Location: Right Arm)   Pulse (!) 58   Temp 98.6 F (37 C) (Oral)   Resp 17   Ht _0  (1.651 m)   Wt 83.9 kg   SpO2 100%   BMI 30.78 kg/m   Intake/Output Summary (Last 24 hours) at 08/17/2022 1017 Last data filed at 08/17/2022 0531 Gross per 24 hour  Intake 120 ml  Output 1100 ml  Net -980 ml   Weight change: 1.3 kg  Physical Exam: Gen: NAD, sitting in bed CVS: RRR Resp: some faint bibasilar crackles Abd: soft Ext: 1+ LE edema, improving  Imaging: US RENAL  Result  Date: 08/15/2022 CLINICAL DATA:  Acute renal injury EXAM: RENAL / URINARY TRACT ULTRASOUND COMPLETE COMPARISON:  None Available. FINDINGS: Right Kidney: Renal measurements: 11.2 x 4.1 x 7.0 cm = volume: 168 mL. Moderate hydronephrosis. Renal pelvis dilated. Left Kidney: Renal measurements: 9.9 x 5.5 x 6.4 cm = volume: 180 mL. Moderate hydronephrosis. Renal pelvis dilated Bladder: Bladder collapsed around Foley catheter. Other: None. IMPRESSION: 1. Moderate bilateral hydronephrosis. 2. Foley catheter within collapsed bladder Electronically Signed   By: Suzy Bouchard M.D.   On: 08/15/2022 15:17    Labs: BMET Recent Labs  Lab 08/14/22 1930 08/15/22 0330 08/16/22 0444 08/17/22 0055  NA 145 143 142 138  K 4.5 3.9 4.0 3.8  CL 113* 115* 110 113*  CO2 17* 19* 18* 18*  GLUCOSE 67* 78 68* 98  BUN 109* 107* 110* 106*  CREATININE 5.14* 5.20* 5.23* 5.39*  CALCIUM 7.8* 7.4* 7.4* 7.1*  PHOS  --  8.5* 8.6* 8.7*   CBC Recent Labs  Lab 08/14/22 1930 08/15/22 0330 08/15/22 1819 08/16/22 0444  WBC 4.6 4.5 4.9 4.3  NEUTROABS 2.9 2.8  --   --   HGB 9.0* 7.2* 7.8* 7.5*  HCT 27.7* 23.6* 24.7* 22.6*  MCV 92.3 95.2 94.3 91.1  PLT 160 150 148* 139*    Medications:     carvedilol  6.25 mg  Oral BID WC   Chlorhexidine Gluconate Cloth  6 each Topical Daily   darbepoetin (ARANESP) injection - NON-DIALYSIS  150 mcg Subcutaneous Q Sat-1800   ferrous sulfate  325 mg Oral Daily   heparin  5,000 Units Subcutaneous Q8H   hydrALAZINE  50 mg Oral TID   sevelamer carbonate  800 mg Oral TID with meals    Madelon Lips, MD 08/17/2022, 10:17 AM

## 2022-08-17 NOTE — Progress Notes (Signed)
  Progress Note   Patient: Kara Mullins BZM:[id number] DOB: 09-12-1951 DOA: 08/14/2022     3 DOS: the patient was seen and examined on 08/17/2022 at 12:24pm      Brief hospital course: Mrs. Gauer is a 71 y.o. F with CKD V not on HD who presented with progressive renal failure.  See prior summary.     Assessment and Plan: * Acute on chronic diastolic heart failure (HCC) Echo 2 months ago showed EF up to 55%, grade I DD Net negative 800cc overnight, swelling seems worse.  Cr and K stable - Continue Furosemide 120 mg IV twice a day one more day - Monitor K - Strict I/Os, daily weights, telemetry  - Daily monitoring renal function - Consult Nephrology      CKD (chronic kidney disease) stage 5, GFR less than 15 ml/min (HCC) Cr essentially no change. - Follow SPEP, urine immunofixation - Strict I/Os - Continue diuresis - Consult Nephrology   Hypertensive emergency Essential hypertension BP controlled off nitro drip - Continue Lasix, Coreg, hydralazine   Subclinical hypothyroidism See prior discussion    Normocytic anemia - Continue iron - Aranesp           Subjective: Patient swelling is little bit worse today, she has no dyspnea, her energy is stable, she would like to go home     Physical Exam: BP (!) 140/63 (BP Location: Right Arm)   Pulse (!) 58   Temp 98.6 F (37 C) (Oral)   Resp 17   Ht 5\' 5"  (1.651 m)   Wt 83.9 kg   SpO2 100%   BMI 30.78 kg/m   Obese adult female, lying in bed, no acute distress, appears tired RRR, soft systolic murmur, she has 2+ pitting edema under her ace wraps Respiratory rate normal, lungs clear without rales or wheezes Abdomen soft without tenderness palpation or guarding, no ascites or distention Attention normal, affect blunted, judgment and insight appear normal    Data Reviewed: Nephrology notes reviewed Basic metabolic panel shows creatinine up to 5.39, BUN 106, no change Glucose of essentially  normal Ins and outs reviewed   Family Communication: Daughter by phone    Disposition: Status is: Inpatient Likely home with home health tomorrow        Author: Edwin Dada, MD 08/17/2022 2:21 PM  For on call review [email or web address].

## 2022-08-18 ENCOUNTER — Inpatient Hospital Stay (HOSPITAL_COMMUNITY): Payer: Medicare (Managed Care)

## 2022-08-18 DIAGNOSIS — I5033 Acute on chronic diastolic (congestive) heart failure: Secondary | ICD-10-CM | POA: Diagnosis not present

## 2022-08-18 LAB — RENAL FUNCTION PANEL
Albumin: 1.7 g/dL — ABNORMAL LOW (ref 3.5–5.0)
Anion gap: 12 (ref 5–15)
BUN: 106 mg/dL — ABNORMAL HIGH (ref 8–23)
CO2: 17 mmol/L — ABNORMAL LOW (ref 22–32)
Calcium: 7.2 mg/dL — ABNORMAL LOW (ref 8.9–10.3)
Chloride: 108 mmol/L (ref 98–111)
Creatinine, Ser: 5.12 mg/dL — ABNORMAL HIGH (ref 0.44–1.00)
GFR, Estimated: 9 mL/min — ABNORMAL LOW (ref >60)
Glucose, Bld: 94 mg/dL (ref 70–99)
Phosphorus: 8.7 mg/dL — ABNORMAL HIGH (ref 2.5–4.6)
Potassium: 3.8 mmol/L (ref 3.5–5.1)
Sodium: 137 mmol/L (ref 135–145)

## 2022-08-18 LAB — GLUCOSE, CAPILLARY: Glucose-Capillary: 90 mg/dL (ref 70–99)

## 2022-08-18 MED ORDER — TORSEMIDE 20 MG PO TABS: 60.0000 mg | ORAL_TABLET | Freq: Two times a day (BID) | ORAL | Status: DC

## 2022-08-18 MED ORDER — TORSEMIDE 20 MG PO TABS
80.0000 mg | ORAL_TABLET | Freq: Two times a day (BID) | ORAL | Status: DC
  Administered 2022-08-18: 80 mg via ORAL
  Filled 2022-08-18: qty 4

## 2022-08-18 MED ORDER — CARVEDILOL 6.25 MG PO TABS: 12.5000 mg | ORAL_TABLET | Freq: Two times a day (BID) | ORAL | 0 refills | Status: DC

## 2022-08-18 MED ORDER — TORSEMIDE 40 MG PO TABS: 80.0000 mg | ORAL_TABLET | Freq: Two times a day (BID) | ORAL | 1 refills | Status: DC

## 2022-08-18 NOTE — TOC Transition Note (Signed)
Transition of Care [facility]) - CM/SW Discharge Note   Patient Details  Name: Kara Mullins MRN: [id number] Date of Birth: 1951-06-29  Transition of Care [facility]) CM/SW Contact:  Carles Collet, RN Phone Number: 08/18/2022, 12:21 PM   Clinical Narrative:      Kara Mullins w patient at bedside and confirmed choices for DC. She would like to continue with [facility] for Palliative services, liaison notified of DC today. She would like to continue with [facility] for [facility] services, liaiosn notified. She states that she has support from family at home and they will be providing her ride home. No DME needs for DC (she is working with PCP to get steady)  No other TOC needs identified for DC    Final next level of care: Home w Home Health Services Barriers to Discharge: No Barriers Identified   Patient Goals and CMS Choice Patient states their goals for this hospitalization and ongoing recovery are:: patient CMS Medicare.gov Compare Post Acute Care list provided to:: Patient Choice offered to / list presented to : Patient  Discharge Placement                       Discharge Plan and Services                DME Arranged: N/A         HH Arranged: OT, PT, Nurse's Aide [place] Agency: [place] ([facility]) Date [place]: 08/18/22 Time [place]: 1221 Representative spoke with at [place]: [place] ([place]) Interventions     Readmission Risk Interventions    06/17/2022    3:36 PM 06/17/2022    3:34 PM 06/05/2022    4:41 PM  Readmission Risk Prevention Plan  Transportation Screening  Complete Complete  Medication Review Press photographer) Complete Complete Complete  PCP or Specialist appointment within 3-5 days of discharge Complete Complete   HRI or Home Care Consult Complete  Complete  SW Recovery Care/Counseling Consult Complete  Complete  Palliative Care Screening Not Applicable  Not [place] Not  Applicable  Not Applicable

## 2022-08-18 NOTE — Discharge Summary (Signed)
Physician Discharge Summary   Patient: Kara Mullins MRN: [id number] DOB: Nov 11, 1950  Admit date:     08/14/2022  Discharge date: 08/18/22  Discharge Physician: Kara Mullins   PCP: Kara Hughs, NP     Recommendations at discharge:  Follow up with Nephrology Kara Mullins in 1 week Kara Mullins:  Please check BMP in 1 week Follow up SPEP and UIF sent by Dr. Marval Mullins  Follow up with Palliative Care after discharge given worsening renal function and fluid overload symptoms refractory to diuretics     Discharge Diagnoses: Principal Problem:   Acute on chronic diastolic heart failure (HCC) Active Problems:   CKD (chronic kidney disease) stage 5, GFR less than 15 ml/min (HCC)   Essential hypertension   Normocytic anemia   Prolonged QT interval   Subclinical hypothyroidism   Hypoglycemia   Hypertensive emergency   Obesity (BMI 30-39.9)      Hospital Course: Kara Mullins is a 71 y.o. F with CKD V not on HD, wheelchair bound, chronic indwelling foley due to urine retention, sdCHF most recent EF 55%, pAF not on AC, and history of IgG kappa monoclonal protein who presented with acute on chronic swelling and dyspnea over last few days.  In the ER, BP 190/99 and very edematous.  CXR showed bilateral effusions and edema.  She was given Lasix 100 mg IV and admitted for further work up.  Cr 5.14 mg/dL, consistent with her most recent values 2 months ago.         * Acute on chronic diastolic heart failure (HCC) CKD (chronic kidney disease) stage 5, GFR less than 15 ml/min Echo 2 months ago showed good EF 55%, only grade I DD  Measured ins and outs and daily weights were conflicting while in the hospital, and on my assessment she had essentially no clinical change with diuresis.  I think what we are observing is refractoriness to diuresis as her renal failure progresses.    She is not a candidate for and does not wish for dialysis, and she and family have a clear  understanding of her prognosis.    Recommend close follow up with Nephrology but also Palliative Care follow up.  Referral to Palliative Care made.  Hospice discussed, patient seems to have an aversion to comfort oriented care, but also made clear and consistent her wish to go home from the hospital.        Obesity (BMI 30-39.9) BMI 30.4  Hypertensive emergency Admitted with BP >190 and CHF.  Nitro gtt started, BP controlled, resumed home BP meds and BP controlled 150s last 48 hours.     Hypoglycemia Resolved  Subclinical hypothyroidism Has known subclinical hypothyroidism.  TSH 6, fT4 normal.  My understanding is that treatment of a 71 year old in that context is controversial, and although heart failure might militate in favor of treatment, her fluid overload is largely driven by her kidney failure, which LT4 wouldn't help, and it's hard to gauge the balance of benefit and harm with her limited prognosis.     Normocytic anemia Longstanding.  Probably from chronic kidney disease.  It looks like she had an Mspike on SPEP over a year ago, but this was not consistent with her BMB and Hematology follow up was not arranged.    When [facility] sent a referral to Heme in 2023, it was canceled by family "[Daughter wanted] to know if it is ok NOT to take patient to the [facility]. Stated that it  is hard for the patient and if they cannot do anything for her, she wants to keep her comfortable", which seems appropriate and in keeping with the patient's expressed wishes to me.  Here, she was given her Aranesp which was due.              [facility] Controlled Substances Registry was reviewed for this patient prior to discharge.   Consultants: Nephrology   Disposition: Home health   DISCHARGE MEDICATION: Allergies as of 08/18/2022       Reactions   Crestor [rosuvastatin] Other (See Comments)   "Felt like she couldn't swallow"        Medication List     STOP  taking these medications    losartan 25 MG tablet Commonly known as: COZAAR       TAKE these medications    b complex-vitamin c-folic acid 0.8 MG Tabs tablet Take 1 tablet by mouth at bedtime.   carvedilol 6.25 MG tablet Commonly known as: COREG Take 2 tablets (12.5 mg total) by mouth 2 (two) times daily with a meal.   cholecalciferol 25 MCG (1000 UNIT) tablet Commonly known as: VITAMIN D3 Take 1,000 Units by mouth daily.   feeding supplement Liqd Take 237 mLs by mouth 2 (two) times daily between meals.   ferrous sulfate 325 (65 FE) MG tablet Take 1 tablet (325 mg total) by mouth daily.   hydrALAZINE 50 MG tablet Commonly known as: APRESOLINE TAKE 1 TABLET(50 MG) BY MOUTH EVERY 8 HOURS What changed: See the new instructions.   MULTIVITAMIN ADULT PO Take 1 tablet by mouth daily.   Muscle Rub 10-15 % Crea Apply 1 Application topically as needed for muscle pain.   potassium chloride 10 MEQ CR capsule Commonly known as: MICRO-K Take 10 mEq by mouth daily.   sevelamer carbonate 800 MG tablet Commonly known as: RENVELA Take 800 mg by mouth 3 (three) times daily.   STOOL SOFTENER LAXATIVE PO Take 1 capsule by mouth as needed (constipation).   Torsemide 40 MG Tabs Take 80 mg by mouth 2 (two) times daily. What changed:  medication strength how much to take   Vitamin D (Ergocalciferol) 1.25 MG (50000 UNIT) Caps capsule Commonly known as: DRISDOL Take 50,000 Units by mouth once a week.        Follow-up Information     Kara Desanctis, MD. Schedule an appointment as soon as possible for a visit in 1 week(s).   Specialty: Nephrology Contact information: [place] [address] [phone]         [name], [name], NP. Schedule an appointment as soon as possible for a visit in 1 week(s).   Specialty: Family Medicine Contact information: [place] [phone]                 Discharge Instructions      Discharge instructions   Complete by: As directed    **IMPORTANT DISCHARGE INSTRUCTIONS**   From Dr. Loleta Books: You were admitted for fluid overload from your renal failure. Here, you were treated with IV Lasix and we hope that we were able to get some of the fluid off. As your kidneys get worse, this is getting harder to do.  We have adjusted your torsemide dose (increased it to 80 mg twice daily) and we hope that it is sufficient  Because a time will come when no medical treatments can get the fluid off, we recommend that you follow  up with a palliative care team.  This is not "Hospice" but is a team who can support your physical and spiritual needs, as your illness progresses and our medicines do less and less.   Go see Kara Mullins in 1 week Call her office tomorrow to arrange a follow up  Get her to check your kidney function in 1 week   Increase activity slowly   Complete by: As directed        Discharge Exam: Filed Weights   08/16/22 0100 08/17/22 0524 08/18/22 0500  Weight: 82.6 kg 83.9 kg 84.5 kg    General: Pt is alert, awake, not in acute distress, sitting on edge of bed Cardiovascular: RRR, nl S1-S2, no murmurs appreciated.   1+ bialtearl edema with ace wraps Respiratory: Normal respiratory rate and rhythm. CTAB, somewhat diminished at left base, CXR shows only small to moderate effusions, no change in congestion from admission Abdominal: Abdomen soft and non-tender.  No distension or HSM.   Neuro/Psych: Strength symmetric in upper and lower extremities.  Judgment and insight appear normal.   Condition at discharge: stable  The results of significant diagnostics from this hospitalization (including imaging, microbiology, ancillary and laboratory) are listed below for reference.   Imaging Studies: DG Chest 2 View  Result Date: 08/18/2022 CLINICAL DATA:  Abnormal lung sounds. EXAM: CHEST - 2 VIEW COMPARISON:  08/14/2022 and prior studies FINDINGS: The  cardiomediastinal silhouette is unchanged. Pulmonary vascular congestion noted. Small to moderate bilateral pleural effusions with bibasilar opacities/atelectasis noted. There is no evidence of pneumothorax. No acute bony abnormalities are identified. IMPRESSION: Small to moderate bilateral pleural effusions with bibasilar opacities/atelectasis and pulmonary vascular congestion. Electronically Signed   By: Margarette Canada M.D.   On: 08/18/2022 11:58   US RENAL  Result Date: 08/15/2022 CLINICAL DATA:  Acute renal injury EXAM: RENAL / URINARY TRACT ULTRASOUND COMPLETE COMPARISON:  None Available. FINDINGS: Right Kidney: Renal measurements: 11.2 x 4.1 x 7.0 cm = volume: 168 mL. Moderate hydronephrosis. Renal pelvis dilated. Left Kidney: Renal measurements: 9.9 x 5.5 x 6.4 cm = volume: 180 mL. Moderate hydronephrosis. Renal pelvis dilated Bladder: Bladder collapsed around Foley catheter. Other: None. IMPRESSION: 1. Moderate bilateral hydronephrosis. 2. Foley catheter within collapsed bladder Electronically Signed   By: Suzy Bouchard M.D.   On: 08/15/2022 15:17   DG Chest Portable 1 View  Result Date: 08/14/2022 CLINICAL DATA:  Shortness of breath EXAM: PORTABLE CHEST 1 VIEW COMPARISON:  06/10/2022 FINDINGS: Cardiomegaly, vascular congestion. Mild perihilar and lower lobe opacities could reflect early edema. Small bilateral effusions. No acute bony abnormality. IMPRESSION: Cardiomegaly with vascular congestion and possible early pulmonary edema. Small bilateral effusions. Electronically Signed   By: Rolm Baptise M.D.   On: 08/14/2022 20:15    Microbiology: Results for orders placed or performed during the hospital encounter of 06/02/22  Culture, blood (Routine X 2) w Reflex to ID Panel     Status: None   Collection Time: 06/10/22 10:54 PM   Specimen: BLOOD RIGHT HAND  Result Value Ref Range Status   Specimen Description BLOOD RIGHT HAND  Final   Special Requests   Final    BOTTLES DRAWN AEROBIC AND  ANAEROBIC Blood Culture results may not be optimal due to an inadequate volume of blood received in culture bottles   Culture   Final    NO GROWTH 5 DAYS Performed at [facility], [place] [address], [place], [place] [address]    Report Status 06/15/2022 FINAL  Final  Culture, blood (Routine X 2) w Reflex to ID Panel     Status: None   Collection Time: 06/10/22 10:54 PM   Specimen: BLOOD  Result Value Ref Range Status   Specimen Description BLOOD LEFT ANTECUBITAL  Final   Special Requests   Final    BOTTLES DRAWN AEROBIC ONLY Blood Culture adequate volume   Culture   Final    NO GROWTH 5 DAYS Performed at [facility], [address] [place], [place] [address]    Report Status 06/15/2022 FINAL  Final  Remove urinary catheter to obtain Clean Catch urine culture     Status: Abnormal   Collection Time: 06/11/22  2:38 PM   Specimen: Urine, Catheterized  Result Value Ref Range Status   Specimen Description URINE, CATHETERIZED  Final   Special Requests   Final    NONE Performed at [facility], [place] [address], [place], [place] [address]    Culture 50,000 COLONIES/mL ENTEROCOCCUS FAECALIS (A)  Final   Report Status 06/14/2022 FINAL  Final   Organism ID, Bacteria ENTEROCOCCUS FAECALIS (A)  Final      Susceptibility   Enterococcus faecalis - MIC*    AMPICILLIN <=2 SENSITIVE Sensitive     NITROFURANTOIN <=16 SENSITIVE Sensitive     VANCOMYCIN 1 SENSITIVE Sensitive     * 50,000 COLONIES/mL ENTEROCOCCUS FAECALIS  MRSA Next Gen by PCR, Nasal     Status: Abnormal   Collection Time: 06/12/22  5:42 AM   Specimen: Nasal Mucosa; Nasal Swab  Result Value Ref Range Status   MRSA by PCR Next Gen DETECTED (A) NOT DETECTED Final    Comment: RESULTS CALLED TO READ BACK BY AND VERIFIED WITH RN D.RMAH @0723  ON 06/12/22 BY NM (NOTE) The GeneXpert MRSA Assay (FDA approved for NASAL specimens only), is one component of a comprehensive MRSA colonization surveillance program. It is not  intended to diagnose MRSA infection nor to guide or monitor treatment for MRSA infections. Test performance is not FDA approved in patients less than 65 years old. Performed at [facility], [place] [address] [place]  [address]   Culture, blood (Routine X 2) w Reflex to ID Panel     Status: None   Collection Time: 06/12/22  5:06 PM   Specimen: BLOOD  Result Value Ref Range Status   Specimen Description BLOOD RIGHT ANTECUBITAL  Final   Special Requests   Final    BOTTLES DRAWN AEROBIC AND ANAEROBIC Blood Culture adequate volume   Culture   Final    NO GROWTH 5 DAYS Performed at [facility], [place] [address], [place], [place] [address]    Report Status 06/17/2022 FINAL  Final  Culture, blood (Routine X 2) w Reflex to ID Panel     Status: None   Collection Time: 06/12/22  5:12 PM   Specimen: BLOOD LEFT HAND  Result Value Ref Range Status   Specimen Description BLOOD LEFT HAND  Final   Special Requests   Final    BOTTLES DRAWN AEROBIC AND ANAEROBIC Blood Culture adequate volume   Culture   Final    NO GROWTH 5 DAYS Performed at [facility], [place] [address] [place], [place] [address]    Report Status 06/17/2022 FINAL  Final    Labs: CBC: Recent Labs  Lab 08/14/22 1930 08/15/22 0330 08/15/22 1819 08/16/22 0444  WBC 4.6 4.5 4.9 4.3  NEUTROABS 2.9 2.8  --   --   HGB  9.0* 7.2* 7.8* 7.5*  HCT 27.7* 23.6* 24.7* 22.6*  MCV 92.3 95.2 94.3 91.1  PLT 160 150 148* 628*   Basic Metabolic Panel: Recent Labs  Lab 08/14/22 1930 08/14/22 2133 08/15/22 0330 08/16/22 0444 08/17/22 0055 08/18/22 0046  NA 145  --  143 142 138 137  K 4.5  --  3.9 4.0 3.8 3.8  CL 113*  --  115* 110 113* 108  CO2 17*  --  19* 18* 18* 17*  GLUCOSE 67*  --  78 68* 98 94  BUN 109*  --  107* 110* 106* 106*  CREATININE 5.14*  --  5.20* 5.23* 5.39* 5.12*  CALCIUM 7.8*  --  7.4* 7.4* 7.1* 7.2*  MG  --  2.5* 2.5*  --   --   --   PHOS  --   --  8.5* 8.6* 8.7* 8.7*   Liver  Function Tests: Recent Labs  Lab 08/14/22 1930 08/15/22 0330 08/16/22 0444 08/17/22 0055 08/18/22 0046  AST 35 27  --   --   --   ALT 27 21  --   --   --   ALKPHOS 114 101  --   --   --   BILITOT 0.7 0.3  --   --   --   PROT 6.3* 5.3*  --   --   --   ALBUMIN 2.1* 1.7* 1.8* 1.7* 1.7*   CBG: Recent Labs  Lab 08/16/22 1633 08/17/22 0628 08/17/22 1133 08/17/22 2312 08/18/22 0510  GLUCAP 90 94 84 82 90    Discharge time spent: approximately 35 minutes spent on discharge counseling, evaluation of patient on day of discharge, and coordination of discharge planning with nursing, social work, pharmacy and case management  Signed: Edwin Dada, MD [facility] 08/18/2022

## 2022-08-18 NOTE — Progress Notes (Signed)
[facility] Progress Note   Assessment/ Plan:    AKI/CKD stage V - in setting of anasarca and acute on chronic CHF.  She has responded to high dose IV lasix and is feeling better today.  Scr up slightly.  Continue with IV lasix 120 mg bid and follow.  She does not want dialysis and is currently without uremic symptoms.    - pt is very eager to go home today.  Can switch to torsemide 80 mg BID and closely follow with [place] in clinic pending CXR results.  - I will help arrange close followup in our clinic with Dr Royce Macadamia  - continue OP palliative followup Acute on chronic diastolic CHF - as above, responding to IV lasix.  Continue with current dose and frequency. Hypertensive emergency - improved with home meds and diuresis.  now off nitro gtt Chronic urinary retention - has chronic indwelling foley catheter. Nephrotic syndrome - unfortunately she never had a kidney biopsy when renal function was better.  I am not sure a biopsy at this time with advanced CKD for the past year would yield any answers or change current therapy.  Also has high risk of bleeding. Monoclonal IgG with kappa light chain - bone marrow biopsy last year with 7% plasma cells and light congo red staining but ultimately nondiagnostic.  Will recheck SPEP/UPEP to see if it has progressed.  Still possibly due to AL amyloidosi--> still pending History of hydronephrosis L>R - will repeat renal US--> hydro unchanged Elevated TSH - recommend checking free T3 and T4 to r/o hypothyroid myxedema.  Subjective:    Pt is very eager to go home- she says she is feeling better although weights and I/O not correlating with symptoms.  She's downstairs getting a CXR today.     Objective:   BP (!) 182/80   Pulse 60   Temp 97.8 F (36.6 C) (Oral)   Resp 13   Ht _0  (1.651 m)   Wt 84.5 kg   SpO2 100%   BMI 31.00 kg/m   Intake/Output Summary (Last 24 hours) at 08/18/2022 1046 Last data filed at 08/18/2022 0502 Gross per 24  hour  Intake 198.46 ml  Output 1600 ml  Net -1401.54 ml   Weight change: 0.6 kg  Physical Exam:  See exam from 11/4 Gen: NAD, sitting in bed CVS: RRR Resp: some faint bibasilar crackles Abd: soft Ext: 1+ LE edema, improving  Imaging: No results found.  Labs: BMET Recent Labs  Lab 08/14/22 1930 08/15/22 0330 08/16/22 0444 08/17/22 0055 08/18/22 0046  NA 145 143 142 138 137  K 4.5 3.9 4.0 3.8 3.8  CL 113* 115* 110 113* 108  CO2 17* 19* 18* 18* 17*  GLUCOSE 67* 78 68* 98 94  BUN 109* 107* 110* 106* 106*  CREATININE 5.14* 5.20* 5.23* 5.39* 5.12*  CALCIUM 7.8* 7.4* 7.4* 7.1* 7.2*  PHOS  --  8.5* 8.6* 8.7* 8.7*   CBC Recent Labs  Lab 08/14/22 1930 08/15/22 0330 08/15/22 1819 08/16/22 0444  WBC 4.6 4.5 4.9 4.3  NEUTROABS 2.9 2.8  --   --   HGB 9.0* 7.2* 7.8* 7.5*  HCT 27.7* 23.6* 24.7* 22.6*  MCV 92.3 95.2 94.3 91.1  PLT 160 150 148* 139*    Medications:     carvedilol  6.25 mg Oral BID WC   Chlorhexidine Gluconate Cloth  6 each Topical Daily   ferrous sulfate  325 mg Oral Daily   heparin  5,000 Units Subcutaneous  Q8H   hydrALAZINE  50 mg Oral TID   sevelamer carbonate  800 mg Oral TID with meals   torsemide  80 mg Oral BID    Madelon Lips, MD 08/18/2022, 10:46 AM

## 2022-08-18 NOTE — Progress Notes (Signed)
[facility]) Hospital Liaison Note  Current outpatient palliative patient followed by [facility] to discharge home today also followed by [facility].  Please call with any questions or concerns.  Thank you, Margaretmary Eddy, BSN, RN [facility] Liaison [phone]

## 2022-08-20 ENCOUNTER — Encounter (HOSPITAL_COMMUNITY): Payer: Self-pay | Admitting: Internal Medicine

## 2022-08-20 DIAGNOSIS — N185 Chronic kidney disease, stage 5: Secondary | ICD-10-CM | POA: Diagnosis not present

## 2022-08-20 DIAGNOSIS — I5033 Acute on chronic diastolic (congestive) heart failure: Secondary | ICD-10-CM | POA: Diagnosis not present

## 2022-08-20 DIAGNOSIS — D631 Anemia in chronic kidney disease: Secondary | ICD-10-CM | POA: Diagnosis not present

## 2022-08-20 DIAGNOSIS — E162 Hypoglycemia, unspecified: Secondary | ICD-10-CM | POA: Diagnosis not present

## 2022-08-20 DIAGNOSIS — I509 Heart failure, unspecified: Secondary | ICD-10-CM | POA: Diagnosis not present

## 2022-08-20 DIAGNOSIS — E785 Hyperlipidemia, unspecified: Secondary | ICD-10-CM | POA: Diagnosis not present

## 2022-08-20 DIAGNOSIS — R946 Abnormal results of thyroid function studies: Secondary | ICD-10-CM | POA: Diagnosis not present

## 2022-08-20 DIAGNOSIS — I132 Hypertensive heart and chronic kidney disease with heart failure and with stage 5 chronic kidney disease, or end stage renal disease: Secondary | ICD-10-CM | POA: Diagnosis not present

## 2022-08-20 DIAGNOSIS — Z9181 History of falling: Secondary | ICD-10-CM | POA: Diagnosis not present

## 2022-08-20 DIAGNOSIS — F411 Generalized anxiety disorder: Secondary | ICD-10-CM | POA: Diagnosis not present

## 2022-08-20 DIAGNOSIS — R8281 Pyuria: Secondary | ICD-10-CM | POA: Diagnosis not present

## 2022-08-20 LAB — PROTEIN ELECTROPHORESIS, SERUM
A/G Ratio: 0.7 (ref 0.7–1.7)
Albumin ELP: 2.3 g/dL — ABNORMAL LOW (ref 2.9–4.4)
Alpha-1-Globulin: 0.3 g/dL (ref 0.0–0.4)
Alpha-2-Globulin: 0.7 g/dL (ref 0.4–1.0)
Beta Globulin: 0.7 g/dL (ref 0.7–1.3)
Gamma Globulin: 1.5 g/dL (ref 0.4–1.8)
Globulin, Total: 3.2 g/dL (ref 2.2–3.9)
M-Spike, %: 0.2 g/dL — ABNORMAL HIGH
Total Protein ELP: 5.5 g/dL — ABNORMAL LOW (ref 6.0–8.5)

## 2022-08-21 ENCOUNTER — Telehealth: Payer: Self-pay

## 2022-08-21 LAB — IMMUNOFIXATION, URINE

## 2022-08-21 NOTE — Telephone Encounter (Signed)
Tillie Rung with [facility] called requesting verbal orders. PT 2 times a week for 2 weeks, 1 time a week for 4 weeks and 1 time every other week for 2 weeks.  Verbal orders given

## 2022-08-22 DIAGNOSIS — I5032 Chronic diastolic (congestive) heart failure: Secondary | ICD-10-CM | POA: Diagnosis not present

## 2022-08-22 DIAGNOSIS — R768 Other specified abnormal immunological findings in serum: Secondary | ICD-10-CM | POA: Diagnosis not present

## 2022-08-22 DIAGNOSIS — I120 Hypertensive chronic kidney disease with stage 5 chronic kidney disease or end stage renal disease: Secondary | ICD-10-CM | POA: Diagnosis not present

## 2022-08-22 DIAGNOSIS — E876 Hypokalemia: Secondary | ICD-10-CM | POA: Diagnosis not present

## 2022-08-22 DIAGNOSIS — I480 Paroxysmal atrial fibrillation: Secondary | ICD-10-CM | POA: Diagnosis not present

## 2022-08-22 DIAGNOSIS — D631 Anemia in chronic kidney disease: Secondary | ICD-10-CM | POA: Diagnosis not present

## 2022-08-22 DIAGNOSIS — N184 Chronic kidney disease, stage 4 (severe): Secondary | ICD-10-CM | POA: Diagnosis not present

## 2022-08-22 DIAGNOSIS — N185 Chronic kidney disease, stage 5: Secondary | ICD-10-CM | POA: Diagnosis not present

## 2022-08-22 DIAGNOSIS — K5900 Constipation, unspecified: Secondary | ICD-10-CM | POA: Diagnosis not present

## 2022-08-23 LAB — COMPREHENSIVE METABOLIC PANEL
Albumin: 2.5 — AB (ref 3.5–5.0)
Calcium: 7.4 — AB (ref 8.7–10.7)

## 2022-08-23 LAB — BASIC METABOLIC PANEL
BUN: 106 — AB (ref 4–21)
CO2: 16 (ref 13–22)
Chloride: 111 — AB (ref 99–108)
Creatinine: 5.5 — AB (ref 0.5–1.1)
Glucose: 69
Potassium: 3.9 mEq/L (ref 3.5–5.1)
Sodium: 143 (ref 137–147)

## 2022-08-23 LAB — CBC AND DIFFERENTIAL: Hemoglobin: 8.3 — AB (ref 12.0–16.0)

## 2022-08-25 DIAGNOSIS — E785 Hyperlipidemia, unspecified: Secondary | ICD-10-CM | POA: Diagnosis not present

## 2022-08-25 DIAGNOSIS — F411 Generalized anxiety disorder: Secondary | ICD-10-CM | POA: Diagnosis not present

## 2022-08-25 DIAGNOSIS — R946 Abnormal results of thyroid function studies: Secondary | ICD-10-CM | POA: Diagnosis not present

## 2022-08-25 DIAGNOSIS — N185 Chronic kidney disease, stage 5: Secondary | ICD-10-CM | POA: Diagnosis not present

## 2022-08-25 DIAGNOSIS — I132 Hypertensive heart and chronic kidney disease with heart failure and with stage 5 chronic kidney disease, or end stage renal disease: Secondary | ICD-10-CM | POA: Diagnosis not present

## 2022-08-25 DIAGNOSIS — D631 Anemia in chronic kidney disease: Secondary | ICD-10-CM | POA: Diagnosis not present

## 2022-08-25 DIAGNOSIS — R8281 Pyuria: Secondary | ICD-10-CM | POA: Diagnosis not present

## 2022-08-25 DIAGNOSIS — Z9181 History of falling: Secondary | ICD-10-CM | POA: Diagnosis not present

## 2022-08-25 DIAGNOSIS — I5033 Acute on chronic diastolic (congestive) heart failure: Secondary | ICD-10-CM | POA: Diagnosis not present

## 2022-08-25 DIAGNOSIS — E162 Hypoglycemia, unspecified: Secondary | ICD-10-CM | POA: Diagnosis not present

## 2022-08-25 DIAGNOSIS — I509 Heart failure, unspecified: Secondary | ICD-10-CM | POA: Diagnosis not present

## 2022-08-27 ENCOUNTER — Ambulatory Visit: Payer: Self-pay

## 2022-08-27 NOTE — Patient Outreach (Signed)
  Care Coordination   08/27/2022 Name: Ananda Caya MRN: [id number] DOB: 12/29/1950   Care Coordination Outreach Attempts:  An unsuccessful telephone outreach was attempted for a scheduled appointment today.  Follow Up Plan:  Additional outreach attempts will be made to offer the patient care coordination information and services.   Encounter Outcome:  No Answer  Care Coordination Interventions Activated:  No   Care Coordination Interventions:  No, not indicated    Barb Merino, RN, BSN, CCM Care Management Coordinator [facility]  Direct Phone: [phone]

## 2022-08-28 DIAGNOSIS — D631 Anemia in chronic kidney disease: Secondary | ICD-10-CM | POA: Diagnosis not present

## 2022-08-28 DIAGNOSIS — I5033 Acute on chronic diastolic (congestive) heart failure: Secondary | ICD-10-CM | POA: Diagnosis not present

## 2022-08-28 DIAGNOSIS — N185 Chronic kidney disease, stage 5: Secondary | ICD-10-CM | POA: Diagnosis not present

## 2022-08-28 DIAGNOSIS — I132 Hypertensive heart and chronic kidney disease with heart failure and with stage 5 chronic kidney disease, or end stage renal disease: Secondary | ICD-10-CM | POA: Diagnosis not present

## 2022-08-28 DIAGNOSIS — Z9181 History of falling: Secondary | ICD-10-CM | POA: Diagnosis not present

## 2022-08-28 DIAGNOSIS — R8281 Pyuria: Secondary | ICD-10-CM | POA: Diagnosis not present

## 2022-08-28 DIAGNOSIS — I509 Heart failure, unspecified: Secondary | ICD-10-CM | POA: Diagnosis not present

## 2022-08-28 DIAGNOSIS — F411 Generalized anxiety disorder: Secondary | ICD-10-CM | POA: Diagnosis not present

## 2022-08-28 DIAGNOSIS — E162 Hypoglycemia, unspecified: Secondary | ICD-10-CM | POA: Diagnosis not present

## 2022-08-28 DIAGNOSIS — E785 Hyperlipidemia, unspecified: Secondary | ICD-10-CM | POA: Diagnosis not present

## 2022-08-28 DIAGNOSIS — R946 Abnormal results of thyroid function studies: Secondary | ICD-10-CM | POA: Diagnosis not present

## 2022-08-29 DIAGNOSIS — Z9181 History of falling: Secondary | ICD-10-CM | POA: Diagnosis not present

## 2022-08-29 DIAGNOSIS — I509 Heart failure, unspecified: Secondary | ICD-10-CM | POA: Diagnosis not present

## 2022-08-29 DIAGNOSIS — E785 Hyperlipidemia, unspecified: Secondary | ICD-10-CM | POA: Diagnosis not present

## 2022-08-29 DIAGNOSIS — N185 Chronic kidney disease, stage 5: Secondary | ICD-10-CM | POA: Diagnosis not present

## 2022-08-29 DIAGNOSIS — I132 Hypertensive heart and chronic kidney disease with heart failure and with stage 5 chronic kidney disease, or end stage renal disease: Secondary | ICD-10-CM | POA: Diagnosis not present

## 2022-08-29 DIAGNOSIS — R946 Abnormal results of thyroid function studies: Secondary | ICD-10-CM | POA: Diagnosis not present

## 2022-08-29 DIAGNOSIS — F411 Generalized anxiety disorder: Secondary | ICD-10-CM | POA: Diagnosis not present

## 2022-08-29 DIAGNOSIS — E162 Hypoglycemia, unspecified: Secondary | ICD-10-CM | POA: Diagnosis not present

## 2022-08-29 DIAGNOSIS — D631 Anemia in chronic kidney disease: Secondary | ICD-10-CM | POA: Diagnosis not present

## 2022-08-29 DIAGNOSIS — I5033 Acute on chronic diastolic (congestive) heart failure: Secondary | ICD-10-CM | POA: Diagnosis not present

## 2022-08-29 DIAGNOSIS — R8281 Pyuria: Secondary | ICD-10-CM | POA: Diagnosis not present

## 2022-09-04 DIAGNOSIS — N1832 Chronic kidney disease, stage 3b: Secondary | ICD-10-CM | POA: Diagnosis not present

## 2022-09-04 DIAGNOSIS — N185 Chronic kidney disease, stage 5: Secondary | ICD-10-CM | POA: Diagnosis not present

## 2022-09-04 DIAGNOSIS — I5042 Chronic combined systolic (congestive) and diastolic (congestive) heart failure: Secondary | ICD-10-CM | POA: Diagnosis not present

## 2022-09-04 DIAGNOSIS — U071 COVID-19: Secondary | ICD-10-CM | POA: Diagnosis not present

## 2022-09-04 DIAGNOSIS — R531 Weakness: Secondary | ICD-10-CM | POA: Diagnosis not present

## 2022-09-04 DIAGNOSIS — A419 Sepsis, unspecified organism: Secondary | ICD-10-CM | POA: Diagnosis not present

## 2022-09-11 DIAGNOSIS — D631 Anemia in chronic kidney disease: Secondary | ICD-10-CM | POA: Diagnosis not present

## 2022-09-11 DIAGNOSIS — Z9181 History of falling: Secondary | ICD-10-CM | POA: Diagnosis not present

## 2022-09-11 DIAGNOSIS — R946 Abnormal results of thyroid function studies: Secondary | ICD-10-CM | POA: Diagnosis not present

## 2022-09-11 DIAGNOSIS — I132 Hypertensive heart and chronic kidney disease with heart failure and with stage 5 chronic kidney disease, or end stage renal disease: Secondary | ICD-10-CM | POA: Diagnosis not present

## 2022-09-11 DIAGNOSIS — N185 Chronic kidney disease, stage 5: Secondary | ICD-10-CM | POA: Diagnosis not present

## 2022-09-11 DIAGNOSIS — R8281 Pyuria: Secondary | ICD-10-CM | POA: Diagnosis not present

## 2022-09-11 DIAGNOSIS — F411 Generalized anxiety disorder: Secondary | ICD-10-CM | POA: Diagnosis not present

## 2022-09-11 DIAGNOSIS — E162 Hypoglycemia, unspecified: Secondary | ICD-10-CM | POA: Diagnosis not present

## 2022-09-11 DIAGNOSIS — I5033 Acute on chronic diastolic (congestive) heart failure: Secondary | ICD-10-CM | POA: Diagnosis not present

## 2022-09-11 DIAGNOSIS — E785 Hyperlipidemia, unspecified: Secondary | ICD-10-CM | POA: Diagnosis not present

## 2022-09-11 DIAGNOSIS — I509 Heart failure, unspecified: Secondary | ICD-10-CM | POA: Diagnosis not present

## 2022-09-12 DIAGNOSIS — R8281 Pyuria: Secondary | ICD-10-CM | POA: Diagnosis not present

## 2022-09-12 DIAGNOSIS — E162 Hypoglycemia, unspecified: Secondary | ICD-10-CM | POA: Diagnosis not present

## 2022-09-12 DIAGNOSIS — K5900 Constipation, unspecified: Secondary | ICD-10-CM | POA: Diagnosis not present

## 2022-09-12 DIAGNOSIS — Z6830 Body mass index (BMI) 30.0-30.9, adult: Secondary | ICD-10-CM | POA: Diagnosis not present

## 2022-09-12 DIAGNOSIS — N185 Chronic kidney disease, stage 5: Secondary | ICD-10-CM | POA: Diagnosis not present

## 2022-09-12 DIAGNOSIS — I132 Hypertensive heart and chronic kidney disease with heart failure and with stage 5 chronic kidney disease, or end stage renal disease: Secondary | ICD-10-CM | POA: Diagnosis not present

## 2022-09-12 DIAGNOSIS — E785 Hyperlipidemia, unspecified: Secondary | ICD-10-CM | POA: Diagnosis not present

## 2022-09-12 DIAGNOSIS — M25561 Pain in right knee: Secondary | ICD-10-CM | POA: Diagnosis not present

## 2022-09-12 DIAGNOSIS — I5033 Acute on chronic diastolic (congestive) heart failure: Secondary | ICD-10-CM | POA: Diagnosis not present

## 2022-09-12 DIAGNOSIS — I509 Heart failure, unspecified: Secondary | ICD-10-CM | POA: Diagnosis not present

## 2022-09-12 DIAGNOSIS — I4891 Unspecified atrial fibrillation: Secondary | ICD-10-CM | POA: Diagnosis not present

## 2022-09-12 DIAGNOSIS — D631 Anemia in chronic kidney disease: Secondary | ICD-10-CM | POA: Diagnosis not present

## 2022-09-12 DIAGNOSIS — R946 Abnormal results of thyroid function studies: Secondary | ICD-10-CM | POA: Diagnosis not present

## 2022-09-12 DIAGNOSIS — E669 Obesity, unspecified: Secondary | ICD-10-CM | POA: Diagnosis not present

## 2022-09-12 DIAGNOSIS — F411 Generalized anxiety disorder: Secondary | ICD-10-CM | POA: Diagnosis not present

## 2022-09-12 DIAGNOSIS — Z9181 History of falling: Secondary | ICD-10-CM | POA: Diagnosis not present

## 2022-09-13 ENCOUNTER — Encounter (HOSPITAL_COMMUNITY): Payer: Medicare (Managed Care)

## 2022-09-13 DIAGNOSIS — D631 Anemia in chronic kidney disease: Secondary | ICD-10-CM | POA: Diagnosis not present

## 2022-09-13 DIAGNOSIS — E162 Hypoglycemia, unspecified: Secondary | ICD-10-CM | POA: Diagnosis not present

## 2022-09-13 DIAGNOSIS — N185 Chronic kidney disease, stage 5: Secondary | ICD-10-CM | POA: Diagnosis not present

## 2022-09-13 DIAGNOSIS — R946 Abnormal results of thyroid function studies: Secondary | ICD-10-CM | POA: Diagnosis not present

## 2022-09-13 DIAGNOSIS — E785 Hyperlipidemia, unspecified: Secondary | ICD-10-CM | POA: Diagnosis not present

## 2022-09-13 DIAGNOSIS — R8281 Pyuria: Secondary | ICD-10-CM | POA: Diagnosis not present

## 2022-09-13 DIAGNOSIS — I5033 Acute on chronic diastolic (congestive) heart failure: Secondary | ICD-10-CM | POA: Diagnosis not present

## 2022-09-13 DIAGNOSIS — I132 Hypertensive heart and chronic kidney disease with heart failure and with stage 5 chronic kidney disease, or end stage renal disease: Secondary | ICD-10-CM | POA: Diagnosis not present

## 2022-09-13 DIAGNOSIS — I509 Heart failure, unspecified: Secondary | ICD-10-CM | POA: Diagnosis not present

## 2022-09-13 DIAGNOSIS — F411 Generalized anxiety disorder: Secondary | ICD-10-CM | POA: Diagnosis not present

## 2022-09-13 DIAGNOSIS — Z9181 History of falling: Secondary | ICD-10-CM | POA: Diagnosis not present

## 2022-09-17 DIAGNOSIS — I5042 Chronic combined systolic (congestive) and diastolic (congestive) heart failure: Secondary | ICD-10-CM | POA: Diagnosis not present

## 2022-09-17 DIAGNOSIS — R531 Weakness: Secondary | ICD-10-CM | POA: Diagnosis not present

## 2022-09-17 DIAGNOSIS — R946 Abnormal results of thyroid function studies: Secondary | ICD-10-CM | POA: Diagnosis not present

## 2022-09-17 DIAGNOSIS — Z9181 History of falling: Secondary | ICD-10-CM | POA: Diagnosis not present

## 2022-09-17 DIAGNOSIS — A419 Sepsis, unspecified organism: Secondary | ICD-10-CM | POA: Diagnosis not present

## 2022-09-17 DIAGNOSIS — F411 Generalized anxiety disorder: Secondary | ICD-10-CM | POA: Diagnosis not present

## 2022-09-17 DIAGNOSIS — I509 Heart failure, unspecified: Secondary | ICD-10-CM | POA: Diagnosis not present

## 2022-09-17 DIAGNOSIS — I5033 Acute on chronic diastolic (congestive) heart failure: Secondary | ICD-10-CM | POA: Diagnosis not present

## 2022-09-17 DIAGNOSIS — N1832 Chronic kidney disease, stage 3b: Secondary | ICD-10-CM | POA: Diagnosis not present

## 2022-09-17 DIAGNOSIS — R8281 Pyuria: Secondary | ICD-10-CM | POA: Diagnosis not present

## 2022-09-17 DIAGNOSIS — U071 COVID-19: Secondary | ICD-10-CM | POA: Diagnosis not present

## 2022-09-17 DIAGNOSIS — N185 Chronic kidney disease, stage 5: Secondary | ICD-10-CM | POA: Diagnosis not present

## 2022-09-17 DIAGNOSIS — I132 Hypertensive heart and chronic kidney disease with heart failure and with stage 5 chronic kidney disease, or end stage renal disease: Secondary | ICD-10-CM | POA: Diagnosis not present

## 2022-09-17 DIAGNOSIS — E785 Hyperlipidemia, unspecified: Secondary | ICD-10-CM | POA: Diagnosis not present

## 2022-09-17 DIAGNOSIS — D631 Anemia in chronic kidney disease: Secondary | ICD-10-CM | POA: Diagnosis not present

## 2022-09-17 DIAGNOSIS — E162 Hypoglycemia, unspecified: Secondary | ICD-10-CM | POA: Diagnosis not present

## 2022-09-18 DIAGNOSIS — Z9181 History of falling: Secondary | ICD-10-CM | POA: Diagnosis not present

## 2022-09-18 DIAGNOSIS — I509 Heart failure, unspecified: Secondary | ICD-10-CM | POA: Diagnosis not present

## 2022-09-18 DIAGNOSIS — D631 Anemia in chronic kidney disease: Secondary | ICD-10-CM | POA: Diagnosis not present

## 2022-09-18 DIAGNOSIS — N185 Chronic kidney disease, stage 5: Secondary | ICD-10-CM | POA: Diagnosis not present

## 2022-09-18 DIAGNOSIS — I5033 Acute on chronic diastolic (congestive) heart failure: Secondary | ICD-10-CM | POA: Diagnosis not present

## 2022-09-18 DIAGNOSIS — R946 Abnormal results of thyroid function studies: Secondary | ICD-10-CM | POA: Diagnosis not present

## 2022-09-18 DIAGNOSIS — R8281 Pyuria: Secondary | ICD-10-CM | POA: Diagnosis not present

## 2022-09-18 DIAGNOSIS — E162 Hypoglycemia, unspecified: Secondary | ICD-10-CM | POA: Diagnosis not present

## 2022-09-18 DIAGNOSIS — F411 Generalized anxiety disorder: Secondary | ICD-10-CM | POA: Diagnosis not present

## 2022-09-18 DIAGNOSIS — E785 Hyperlipidemia, unspecified: Secondary | ICD-10-CM | POA: Diagnosis not present

## 2022-09-18 DIAGNOSIS — I132 Hypertensive heart and chronic kidney disease with heart failure and with stage 5 chronic kidney disease, or end stage renal disease: Secondary | ICD-10-CM | POA: Diagnosis not present

## 2022-09-19 ENCOUNTER — Telehealth: Payer: Self-pay | Admitting: *Deleted

## 2022-09-19 DIAGNOSIS — F411 Generalized anxiety disorder: Secondary | ICD-10-CM | POA: Diagnosis not present

## 2022-09-19 DIAGNOSIS — D631 Anemia in chronic kidney disease: Secondary | ICD-10-CM | POA: Diagnosis not present

## 2022-09-19 DIAGNOSIS — R8281 Pyuria: Secondary | ICD-10-CM | POA: Diagnosis not present

## 2022-09-19 DIAGNOSIS — I132 Hypertensive heart and chronic kidney disease with heart failure and with stage 5 chronic kidney disease, or end stage renal disease: Secondary | ICD-10-CM | POA: Diagnosis not present

## 2022-09-19 DIAGNOSIS — I5033 Acute on chronic diastolic (congestive) heart failure: Secondary | ICD-10-CM | POA: Diagnosis not present

## 2022-09-19 DIAGNOSIS — N185 Chronic kidney disease, stage 5: Secondary | ICD-10-CM | POA: Diagnosis not present

## 2022-09-19 DIAGNOSIS — E162 Hypoglycemia, unspecified: Secondary | ICD-10-CM | POA: Diagnosis not present

## 2022-09-19 DIAGNOSIS — R946 Abnormal results of thyroid function studies: Secondary | ICD-10-CM | POA: Diagnosis not present

## 2022-09-19 DIAGNOSIS — I509 Heart failure, unspecified: Secondary | ICD-10-CM | POA: Diagnosis not present

## 2022-09-19 DIAGNOSIS — E785 Hyperlipidemia, unspecified: Secondary | ICD-10-CM | POA: Diagnosis not present

## 2022-09-19 DIAGNOSIS — Z9181 History of falling: Secondary | ICD-10-CM | POA: Diagnosis not present

## 2022-09-19 NOTE — Telephone Encounter (Signed)
Kara Mullins, daughter called and stated that when she was cleaning out Urinary Cath daughter noticed Mucus Whitish Discharge. Daughter stated that there is no other symptoms noted. No fever. Daughter is wanting to stay on top of it because patient has had sepsis in the past.   Daughter is wondering if [facility] can collect a Urine tomorrow when they are out at home.   Please Advise.

## 2022-09-20 ENCOUNTER — Telehealth: Payer: Self-pay

## 2022-09-20 NOTE — Telephone Encounter (Signed)
Daughter Notified and agreed.

## 2022-09-20 NOTE — Telephone Encounter (Signed)
Jerilynn from PT and would like to update patient's PCP on a few things. 1) Patient had a incident where she was talking and suddenly became not responsive and also lost head control but quickly came back within seconds. 2) She feels that patient needs a Power wheelchair. 3) she thinks its best that the patient receive PT for four more weeks 1x a week. She can be reached at [phone].

## 2022-09-20 NOTE — Telephone Encounter (Signed)
Home health Nurse to collect urine specimen for urinalysis and Culture.

## 2022-09-20 NOTE — Telephone Encounter (Signed)
Okay to give verbal orders to continue with PT.Patient will need a face to face visit for evaluation for power Wheelchair.

## 2022-09-23 DIAGNOSIS — E785 Hyperlipidemia, unspecified: Secondary | ICD-10-CM | POA: Diagnosis not present

## 2022-09-23 DIAGNOSIS — R8281 Pyuria: Secondary | ICD-10-CM | POA: Diagnosis not present

## 2022-09-23 DIAGNOSIS — I509 Heart failure, unspecified: Secondary | ICD-10-CM | POA: Diagnosis not present

## 2022-09-23 DIAGNOSIS — N185 Chronic kidney disease, stage 5: Secondary | ICD-10-CM | POA: Diagnosis not present

## 2022-09-23 DIAGNOSIS — I5033 Acute on chronic diastolic (congestive) heart failure: Secondary | ICD-10-CM | POA: Diagnosis not present

## 2022-09-23 DIAGNOSIS — Z9181 History of falling: Secondary | ICD-10-CM | POA: Diagnosis not present

## 2022-09-23 DIAGNOSIS — I132 Hypertensive heart and chronic kidney disease with heart failure and with stage 5 chronic kidney disease, or end stage renal disease: Secondary | ICD-10-CM | POA: Diagnosis not present

## 2022-09-23 DIAGNOSIS — E162 Hypoglycemia, unspecified: Secondary | ICD-10-CM | POA: Diagnosis not present

## 2022-09-23 DIAGNOSIS — F411 Generalized anxiety disorder: Secondary | ICD-10-CM | POA: Diagnosis not present

## 2022-09-23 DIAGNOSIS — R946 Abnormal results of thyroid function studies: Secondary | ICD-10-CM | POA: Diagnosis not present

## 2022-09-23 DIAGNOSIS — D631 Anemia in chronic kidney disease: Secondary | ICD-10-CM | POA: Diagnosis not present

## 2022-09-23 NOTE — Telephone Encounter (Signed)
Left voicemail on Friday for PT.

## 2022-09-25 ENCOUNTER — Telehealth: Payer: Self-pay

## 2022-09-25 MED ORDER — LOKELMA 10 G PO PACK: 10.0000 g | PACK | Freq: Once | ORAL | 0 refills | Status: AC

## 2022-09-25 NOTE — Telephone Encounter (Signed)
Patient daughter called and verified that patient does not go to Dialysis. Lokelma sent into requested pharmacy. Patient daughter asked about continuing taking Potassium tablets. Patient daughter notified to hold off on potassium tablets for 3 days per PCP. Also questions of Urine Culture. Patient daughter notified that we have no results for urine culture. Advised to contact home health nurse so that we can treat patient based off results. Message routed to PCP [name], [name], NP as Juluis Rainier. No further action is required.

## 2022-09-25 NOTE — Progress Notes (Signed)
RS by [facility] 10/15/22

## 2022-09-25 NOTE — Telephone Encounter (Signed)
Verify with patient if going for dialysis today.If not recommend Lokelama 10 g Pack x 1 dose

## 2022-09-25 NOTE — Telephone Encounter (Addendum)
Antonietta Breach RN from [facility] called and states that patient has critical lab for Potassium at 6.8. She states that [facility] called them to notify and they will fax over labs later. Message routed to PCP [name], [name], NP as Juluis Rainier.

## 2022-09-26 DIAGNOSIS — I132 Hypertensive heart and chronic kidney disease with heart failure and with stage 5 chronic kidney disease, or end stage renal disease: Secondary | ICD-10-CM | POA: Diagnosis not present

## 2022-09-26 DIAGNOSIS — E162 Hypoglycemia, unspecified: Secondary | ICD-10-CM | POA: Diagnosis not present

## 2022-09-26 DIAGNOSIS — N185 Chronic kidney disease, stage 5: Secondary | ICD-10-CM | POA: Diagnosis not present

## 2022-09-26 DIAGNOSIS — I509 Heart failure, unspecified: Secondary | ICD-10-CM | POA: Diagnosis not present

## 2022-09-26 DIAGNOSIS — N390 Urinary tract infection, site not specified: Secondary | ICD-10-CM | POA: Diagnosis not present

## 2022-09-26 DIAGNOSIS — R946 Abnormal results of thyroid function studies: Secondary | ICD-10-CM | POA: Diagnosis not present

## 2022-09-26 DIAGNOSIS — F411 Generalized anxiety disorder: Secondary | ICD-10-CM | POA: Diagnosis not present

## 2022-09-26 DIAGNOSIS — E785 Hyperlipidemia, unspecified: Secondary | ICD-10-CM | POA: Diagnosis not present

## 2022-09-26 DIAGNOSIS — D631 Anemia in chronic kidney disease: Secondary | ICD-10-CM | POA: Diagnosis not present

## 2022-09-26 DIAGNOSIS — Z9181 History of falling: Secondary | ICD-10-CM | POA: Diagnosis not present

## 2022-09-26 DIAGNOSIS — I5033 Acute on chronic diastolic (congestive) heart failure: Secondary | ICD-10-CM | POA: Diagnosis not present

## 2022-09-26 DIAGNOSIS — R8281 Pyuria: Secondary | ICD-10-CM | POA: Diagnosis not present

## 2022-09-27 DIAGNOSIS — E785 Hyperlipidemia, unspecified: Secondary | ICD-10-CM | POA: Diagnosis not present

## 2022-09-27 DIAGNOSIS — D631 Anemia in chronic kidney disease: Secondary | ICD-10-CM | POA: Diagnosis not present

## 2022-09-27 DIAGNOSIS — I5033 Acute on chronic diastolic (congestive) heart failure: Secondary | ICD-10-CM | POA: Diagnosis not present

## 2022-09-27 DIAGNOSIS — R946 Abnormal results of thyroid function studies: Secondary | ICD-10-CM | POA: Diagnosis not present

## 2022-09-27 DIAGNOSIS — R8281 Pyuria: Secondary | ICD-10-CM | POA: Diagnosis not present

## 2022-09-27 DIAGNOSIS — E162 Hypoglycemia, unspecified: Secondary | ICD-10-CM | POA: Diagnosis not present

## 2022-09-27 DIAGNOSIS — I132 Hypertensive heart and chronic kidney disease with heart failure and with stage 5 chronic kidney disease, or end stage renal disease: Secondary | ICD-10-CM | POA: Diagnosis not present

## 2022-09-27 DIAGNOSIS — Z9181 History of falling: Secondary | ICD-10-CM | POA: Diagnosis not present

## 2022-09-27 DIAGNOSIS — I509 Heart failure, unspecified: Secondary | ICD-10-CM | POA: Diagnosis not present

## 2022-09-27 DIAGNOSIS — N185 Chronic kidney disease, stage 5: Secondary | ICD-10-CM | POA: Diagnosis not present

## 2022-09-27 DIAGNOSIS — F411 Generalized anxiety disorder: Secondary | ICD-10-CM | POA: Diagnosis not present

## 2022-10-01 DIAGNOSIS — E785 Hyperlipidemia, unspecified: Secondary | ICD-10-CM | POA: Diagnosis not present

## 2022-10-01 DIAGNOSIS — R8281 Pyuria: Secondary | ICD-10-CM | POA: Diagnosis not present

## 2022-10-01 DIAGNOSIS — I509 Heart failure, unspecified: Secondary | ICD-10-CM | POA: Diagnosis not present

## 2022-10-01 DIAGNOSIS — Z9181 History of falling: Secondary | ICD-10-CM | POA: Diagnosis not present

## 2022-10-01 DIAGNOSIS — N185 Chronic kidney disease, stage 5: Secondary | ICD-10-CM | POA: Diagnosis not present

## 2022-10-01 DIAGNOSIS — F411 Generalized anxiety disorder: Secondary | ICD-10-CM | POA: Diagnosis not present

## 2022-10-01 DIAGNOSIS — E162 Hypoglycemia, unspecified: Secondary | ICD-10-CM | POA: Diagnosis not present

## 2022-10-01 DIAGNOSIS — R946 Abnormal results of thyroid function studies: Secondary | ICD-10-CM | POA: Diagnosis not present

## 2022-10-01 DIAGNOSIS — D631 Anemia in chronic kidney disease: Secondary | ICD-10-CM | POA: Diagnosis not present

## 2022-10-01 DIAGNOSIS — I5033 Acute on chronic diastolic (congestive) heart failure: Secondary | ICD-10-CM | POA: Diagnosis not present

## 2022-10-01 DIAGNOSIS — I132 Hypertensive heart and chronic kidney disease with heart failure and with stage 5 chronic kidney disease, or end stage renal disease: Secondary | ICD-10-CM | POA: Diagnosis not present

## 2022-10-02 ENCOUNTER — Telehealth: Payer: Medicare (Managed Care) | Admitting: Family

## 2022-10-02 DIAGNOSIS — D631 Anemia in chronic kidney disease: Secondary | ICD-10-CM | POA: Diagnosis not present

## 2022-10-02 DIAGNOSIS — I132 Hypertensive heart and chronic kidney disease with heart failure and with stage 5 chronic kidney disease, or end stage renal disease: Secondary | ICD-10-CM | POA: Diagnosis not present

## 2022-10-02 DIAGNOSIS — Z9181 History of falling: Secondary | ICD-10-CM | POA: Diagnosis not present

## 2022-10-02 DIAGNOSIS — E162 Hypoglycemia, unspecified: Secondary | ICD-10-CM | POA: Diagnosis not present

## 2022-10-02 DIAGNOSIS — E785 Hyperlipidemia, unspecified: Secondary | ICD-10-CM | POA: Diagnosis not present

## 2022-10-02 DIAGNOSIS — R8281 Pyuria: Secondary | ICD-10-CM | POA: Diagnosis not present

## 2022-10-02 DIAGNOSIS — F411 Generalized anxiety disorder: Secondary | ICD-10-CM | POA: Diagnosis not present

## 2022-10-02 DIAGNOSIS — I509 Heart failure, unspecified: Secondary | ICD-10-CM | POA: Diagnosis not present

## 2022-10-02 DIAGNOSIS — N185 Chronic kidney disease, stage 5: Secondary | ICD-10-CM | POA: Diagnosis not present

## 2022-10-02 DIAGNOSIS — R946 Abnormal results of thyroid function studies: Secondary | ICD-10-CM | POA: Diagnosis not present

## 2022-10-02 DIAGNOSIS — I5033 Acute on chronic diastolic (congestive) heart failure: Secondary | ICD-10-CM | POA: Diagnosis not present

## 2022-10-02 NOTE — Telephone Encounter (Signed)
-----   Message from Dan Maker, [place] sent at 09/30/2022  9:21 AM EST ----- [facility] report

## 2022-10-02 NOTE — Telephone Encounter (Signed)
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  Start on Cipro 500 mg tablet one by mouth once daily x 7 days Take along with probiotics Florastor 250 mg capsule one by mouth twice daily x 10 days to prevent antibiotics associated diarrhea

## 2022-10-04 ENCOUNTER — Encounter (HOSPITAL_COMMUNITY): Payer: Self-pay

## 2022-10-04 ENCOUNTER — Other Ambulatory Visit: Payer: Self-pay

## 2022-10-04 ENCOUNTER — Emergency Department (HOSPITAL_COMMUNITY): Payer: Medicare (Managed Care)

## 2022-10-04 ENCOUNTER — Ambulatory Visit (HOSPITAL_COMMUNITY)
Admission: RE | Admit: 2022-10-04 | Discharge: 2022-10-04 | Disposition: A | Discharge status: Home / Self Care | Payer: Medicare (Managed Care) | Source: Ambulatory Visit | Attending: Nephrology | Admitting: Nephrology

## 2022-10-04 ENCOUNTER — Emergency Department (HOSPITAL_COMMUNITY)
Admission: EM | Admit: 2022-10-04 | Discharge: 2022-10-04 | Disposition: A | Discharge status: Home / Self Care | Payer: Medicare (Managed Care) | Attending: Emergency Medicine | Admitting: Emergency Medicine

## 2022-10-04 VITALS — BP 91/59 | HR 54 | Temp 97.4°F | Resp 18

## 2022-10-04 DIAGNOSIS — I129 Hypertensive chronic kidney disease with stage 1 through stage 4 chronic kidney disease, or unspecified chronic kidney disease: Secondary | ICD-10-CM | POA: Diagnosis not present

## 2022-10-04 DIAGNOSIS — I959 Hypotension, unspecified: Secondary | ICD-10-CM | POA: Insufficient documentation

## 2022-10-04 DIAGNOSIS — N185 Chronic kidney disease, stage 5: Secondary | ICD-10-CM | POA: Insufficient documentation

## 2022-10-04 DIAGNOSIS — Z79899 Other long term (current) drug therapy: Secondary | ICD-10-CM | POA: Insufficient documentation

## 2022-10-04 DIAGNOSIS — I5042 Chronic combined systolic (congestive) and diastolic (congestive) heart failure: Secondary | ICD-10-CM | POA: Diagnosis not present

## 2022-10-04 DIAGNOSIS — A419 Sepsis, unspecified organism: Secondary | ICD-10-CM | POA: Diagnosis not present

## 2022-10-04 DIAGNOSIS — N189 Chronic kidney disease, unspecified: Secondary | ICD-10-CM | POA: Insufficient documentation

## 2022-10-04 DIAGNOSIS — N3000 Acute cystitis without hematuria: Secondary | ICD-10-CM | POA: Diagnosis not present

## 2022-10-04 DIAGNOSIS — U071 COVID-19: Secondary | ICD-10-CM | POA: Diagnosis not present

## 2022-10-04 DIAGNOSIS — N1832 Chronic kidney disease, stage 3b: Secondary | ICD-10-CM | POA: Diagnosis not present

## 2022-10-04 DIAGNOSIS — R944 Abnormal results of kidney function studies: Secondary | ICD-10-CM | POA: Diagnosis not present

## 2022-10-04 DIAGNOSIS — R531 Weakness: Secondary | ICD-10-CM | POA: Diagnosis not present

## 2022-10-04 LAB — COMPREHENSIVE METABOLIC PANEL
ALT: 21 U/L (ref 0–44)
AST: 29 U/L (ref 15–41)
Albumin: 1.9 g/dL — ABNORMAL LOW (ref 3.5–5.0)
Alkaline Phosphatase: 91 U/L (ref 38–126)
Anion gap: 15 (ref 5–15)
BUN: 126 mg/dL — ABNORMAL HIGH (ref 8–23)
CO2: 19 mmol/L — ABNORMAL LOW (ref 22–32)
Calcium: 7.3 mg/dL — ABNORMAL LOW (ref 8.9–10.3)
Chloride: 105 mmol/L (ref 98–111)
Creatinine, Ser: 6.53 mg/dL — ABNORMAL HIGH (ref 0.44–1.00)
GFR, Estimated: 6 mL/min — ABNORMAL LOW (ref >60)
Glucose, Bld: 138 mg/dL — ABNORMAL HIGH (ref 70–99)
Potassium: 3.6 mmol/L (ref 3.5–5.1)
Sodium: 139 mmol/L (ref 135–145)
Total Bilirubin: 0.3 mg/dL (ref 0.3–1.2)
Total Protein: 6.2 g/dL — ABNORMAL LOW (ref 6.5–8.1)

## 2022-10-04 LAB — URINALYSIS, ROUTINE W REFLEX MICROSCOPIC
Bilirubin Urine: NEGATIVE
Glucose, UA: NEGATIVE mg/dL
Hgb urine dipstick: NEGATIVE
Ketones, ur: NEGATIVE mg/dL
Nitrite: NEGATIVE
Protein, ur: >=300 mg/dL — AB
Specific Gravity, Urine: 1.012 (ref 1.005–1.030)
WBC, UA: >50 WBC/hpf (ref 0–5)
pH: 8 (ref 5.0–8.0)

## 2022-10-04 LAB — CBC WITH DIFFERENTIAL/PLATELET
Abs Immature Granulocytes: 0.01 10*3/uL (ref 0.00–0.07)
Basophils Absolute: 0.1 10*3/uL (ref 0.0–0.1)
Basophils Relative: 1 %
Eosinophils Absolute: 0.2 10*3/uL (ref 0.0–0.5)
Eosinophils Relative: 4 %
HCT: 26 % — ABNORMAL LOW (ref 36.0–46.0)
Hemoglobin: 8.1 g/dL — ABNORMAL LOW (ref 12.0–15.0)
Immature Granulocytes: 0 %
Lymphocytes Relative: 21 %
Lymphs Abs: 1.3 10*3/uL (ref 0.7–4.0)
MCH: 30.3 pg (ref 26.0–34.0)
MCHC: 31.2 g/dL (ref 30.0–36.0)
MCV: 97.4 fL (ref 80.0–100.0)
Monocytes Absolute: 0.3 10*3/uL (ref 0.1–1.0)
Monocytes Relative: 5 %
Neutro Abs: 4.2 10*3/uL (ref 1.7–7.7)
Neutrophils Relative %: 69 %
Platelets: 236 10*3/uL (ref 150–400)
RBC: 2.67 MIL/uL — ABNORMAL LOW (ref 3.87–5.11)
RDW: 13.4 % (ref 11.5–15.5)
WBC: 6.1 10*3/uL (ref 4.0–10.5)
nRBC: 0 % (ref 0.0–0.2)

## 2022-10-04 LAB — LACTIC ACID, PLASMA: Lactic Acid, Venous: 1 mmol/L (ref 0.5–1.9)

## 2022-10-04 MED ORDER — HYDROXYZINE HCL 10 MG PO TABS
10.0000 mg | ORAL_TABLET | Freq: Once | ORAL | Status: DC
  Filled 2022-10-04: qty 1

## 2022-10-04 MED ORDER — EPOETIN ALFA-EPBX 10000 UNIT/ML IJ SOLN: 10000.0000 [IU] | INTRAMUSCULAR | Status: DC

## 2022-10-04 MED ORDER — CEPHALEXIN 500 MG PO CAPS: 500.0000 mg | ORAL_CAPSULE | Freq: Three times a day (TID) | ORAL | 0 refills | Status: AC

## 2022-10-04 MED ORDER — CEPHALEXIN 250 MG PO CAPS
500.0000 mg | ORAL_CAPSULE | Freq: Once | ORAL | Status: AC
  Administered 2022-10-04: 500 mg via ORAL
  Filled 2022-10-04: qty 2

## 2022-10-04 NOTE — ED Notes (Signed)
Pt unable to stand long enough for standing BP during orthostatics; family and pt report this is baseline, only able to stand for a few seconds at a time; pt denies dizziness, lightheadedness, states "I'm feeling much better"; EDP notified

## 2022-10-04 NOTE — ED Provider Triage Note (Signed)
Emergency Medicine Provider Triage Evaluation Note  Kara Mullins , a 71 y.o. female  was evaluated in triage.  Pt complains of hypotension.  Patient was being seen today at dialysis center which recorded oxygen saturation in the 60s and patient looking obviously weak.  Patient was also appearing pale and fatigued at this time and was advised to come to the ER for further evaluation.  Patient has known end-stage renal disease and was driving to the infusion center for hemoglobin.  Review of Systems  Positive: See above Negative: See above  Physical Exam  BP (!) 72/48 (BP Location: Right Arm)   Pulse (!) 50   Temp 98 F (36.7 C) (Oral)   Resp 12   SpO2 100%  Gen:   Patient appears lethargic and is slower to respond to questions but still interactive Resp:  Normal effort MSK:   Moves extremities without difficulty Other:  Patient's urine catheter appears to of cloudy, mucoid appearing urine  Medical Decision Making  Medically screening exam initiated at 11:51 AM.  Appropriate orders placed.  Money Mckeithan was informed that the remainder of the evaluation will be completed by another provider, this initial triage assessment does not replace that evaluation, and the importance of remaining in the ED until their evaluation is complete.    Luvenia Heller, [name] 10/04/22 1156

## 2022-10-04 NOTE — ED Provider Notes (Signed)
[facility] EMERGENCY DEPARTMENT Provider Note   CSN: [id number] Arrival date & time: 10/04/22  1135     History Chief Complaint  Patient presents with   Hypotension    Kara Mullins is a 71 y.o. female with history of chronic kidney disease, hypertension, iron deficiency anemia requiring periodic infusions who presents to the emergency department today for further evaluation of hypotension.  Patient was at the infusion center getting her hemoglobin shots when she became generally weak and felt unwell.  They took her pressure which was in the 63J systolic.  She was immediately brought over to the emergency department for further evaluation.  Currently, patient is feeling much better.  Daughter at bedside provides some of the history states that she has been having some sediment and foul-smelling urine in the bag from the catheter the normal.  She denies any recent illness, fever, chills, cough, chest pain, shortness of breath.  HPI     Home Medications Prior to Admission medications   Medication Sig Start Date End Date Taking? Authorizing Provider  carvedilol (COREG) 3.125 MG tablet Take 3.125 mg by mouth 2 (two) times daily with a meal.   Yes [provider]  cholecalciferol (VITAMIN D3) 25 MCG (1000 UNIT) tablet Take 1,000 Units by mouth daily.   Yes [provider]  feeding supplement (ENSURE ENLIVE / ENSURE PLUS) LIQD Take 237 mLs by mouth 2 (two) times daily between meals. 06/17/22  Yes Dana Allan I, MD  hydrALAZINE (APRESOLINE) 50 MG tablet TAKE 1 TABLET(50 MG) BY MOUTH EVERY 8 HOURS Patient taking differently: Take 50 mg by mouth 2 (two) times daily. 07/25/22  Yes [name], [name], NP  metolazone (ZAROXOLYN) 5 MG tablet Take 5 mg by mouth See admin instructions. TAKE 1 TABLET BY MOUTH WEEKLY AS DIRECTED 30 MINUTES BEFORE AM DOSE OF TORESEMIDE   Yes [provider]  Multiple Vitamin (MULTIVITAMIN ADULT PO) Take 1 tablet by mouth  daily.   Yes [provider]  sevelamer carbonate (RENVELA) 800 MG tablet Take 800 mg by mouth every evening. 07/10/22  Yes [provider]  torsemide 40 MG TABS Take 80 mg by mouth 2 (two) times daily. 08/18/22  Yes Danford, Suann Larry, MD  b complex-vitamin c-folic acid (NEPHRO-VITE) 0.8 MG TABS tablet Take 1 tablet by mouth at bedtime. Patient not taking: Reported on 10/04/2022 07/24/22   [name], [name], NP  carvedilol (COREG) 6.25 MG tablet Take 2 tablets (12.5 mg total) by mouth 2 (two) times daily with a meal. Patient not taking: Reported on 10/04/2022 08/18/22 10/04/22  Edwin Dada, MD  ferrous sulfate 325 (65 FE) MG tablet Take 1 tablet (325 mg total) by mouth daily. Patient not taking: Reported on 10/04/2022 07/24/22 01/20/23  [name], [name], NP      Allergies    Crestor [rosuvastatin]    Review of Systems   Review of Systems  All other systems reviewed and are negative.   Physical Exam Updated Vital Signs BP (!) 155/73   Pulse (!) 58   Temp 98 F (36.7 C) (Oral)   Resp 15   SpO2 100%  Physical Exam Vitals and nursing note reviewed.  Constitutional:      General: She is not in acute distress.    Appearance: Normal appearance.  HENT:     Head: Normocephalic and atraumatic.  Eyes:     General:        Right eye: No discharge.  Left eye: No discharge.  Cardiovascular:     Comments: Regular rate and rhythm.  S1/S2 are distinct without any evidence of murmur, rubs, or gallops.  Radial pulses are 2+ bilaterally.  Dorsalis pedis pulses are 2+ bilaterally.  No evidence of pedal edema. Pulmonary:     Comments: Clear to auscultation bilaterally.  Normal effort.  No respiratory distress.  No evidence of wheezes, rales, or rhonchi heard throughout. Abdominal:     General: Abdomen is flat. Bowel sounds are normal. There is no distension.     Tenderness: There is no abdominal tenderness. There is no guarding or rebound.  Musculoskeletal:         General: Normal range of motion.     Cervical back: Neck supple.  Skin:    General: Skin is warm and dry.     Findings: No rash.  Neurological:     General: No focal deficit present.     Mental Status: She is alert.  Psychiatric:        Mood and Affect: Mood normal.        Behavior: Behavior normal.     ED Results / Procedures / Treatments   Labs (all labs ordered are listed, but only abnormal results are displayed) Labs Reviewed  CBC WITH DIFFERENTIAL/PLATELET - Abnormal; Notable for the following components:      Result Value   RBC 2.67 (*)    Hemoglobin 8.1 (*)    HCT 26.0 (*)    All other components within normal limits  COMPREHENSIVE METABOLIC PANEL - Abnormal; Notable for the following components:   CO2 19 (*)    Glucose, Bld 138 (*)    BUN 126 (*)    Creatinine, Ser 6.53 (*)    Calcium 7.3 (*)    Total Protein 6.2 (*)    Albumin 1.9 (*)    GFR, Estimated 6 (*)    All other components within normal limits  URINALYSIS, ROUTINE W REFLEX MICROSCOPIC - Abnormal; Notable for the following components:   Color, Urine YELLOW (*)    APPearance TURBID (*)    Protein, ur >=300 (*)    Leukocytes,Ua LARGE (*)    All other components within normal limits  URINE CULTURE  LACTIC ACID, PLASMA  LACTIC ACID, PLASMA    EKG None  Radiology No results found.  Procedures Procedures   Medications Ordered in ED Medications  hydrOXYzine (ATARAX) tablet 10 mg (has no administration in time range)    ED Course/ Medical Decision Making/ A&P Clinical Course as of 10/04/22 1521  Fri Oct 04, 2022  1516 CBC with Differential(!) There is evidence of anemia disease.  The patient's baseline. [CF]  1517 Comprehensive metabolic panel(!) There is evidence of elevated glucose and elevated creatinine in setting of chronic kidney disease.  This is worsened than her baseline. [CF]  1517 Urinalysis, Routine w reflex microscopic(!) No signs of UTI.  [CF]  1518 Lactic acid,  plasma Normal. [CF]    Clinical Course User Index [CF] Hendricks Limes, [name]                           Medical Decision Making Kara Mullins is a 71 y.o. female patient who presents to the emergency department today for further evaluation of hypotension.  Currently, patient's blood pressure has improved.  Patient does appear overall well.  However, given the fact that she had profound hypotension in the patient's age we will get some labs  to further assess.  Will also get a urinalysis and urine culture to further evaluate for possible sepsis.  She is not currently SIRS positive.  I will plan to reassess from some of the labs result.  Most of her workup is still pending. Due to shift change, the rest of her care will be transferred to [place] [name].  If the patient's workup is unrevealing I do feel the patient could likely be discharged.  MRI also still pending.  Amount and/or Complexity of Data Reviewed Radiology: ordered.  Risk Prescription drug management.   Final Clinical Impression(s) / ED Diagnoses Final diagnoses:  None    Rx / DC Orders ED Discharge Orders     None         Hendricks Limes, [place] 10/04/22 1521    Malvin Johns, MD 10/04/22 1537

## 2022-10-04 NOTE — Discharge Instructions (Addendum)
You came to the emergency department today with a low blood pressure.  You were given IV fluids which helped your blood pressure go up.  We suggested an MRI for you today but you said you did not want to do this.  I have attached the neurologist to these discharge papers if you would like to be further evaluated outpatient.  As we discussed, your kidney function is declining. Please make an appointment with your nephrologist.   Please do not take your torsemide or metolazone tomorrow but you may resume them on Sunday.   Your antibiotics are at your pharmacy.  Please return to the emergency department with any new worsening or recurring symptoms.

## 2022-10-04 NOTE — ED Notes (Signed)
Pt refusing meds and MRI; family at bedside; EDP notified

## 2022-10-04 NOTE — Telephone Encounter (Signed)
Was medication send into pharmacy?

## 2022-10-04 NOTE — Telephone Encounter (Signed)
Patient is currently in the hospital.

## 2022-10-04 NOTE — ED Notes (Signed)
Patient verbalizes understanding of discharge instructions. Opportunity for questioning and answers were provided. Pt discharged from ED. 

## 2022-10-04 NOTE — Progress Notes (Signed)
Patient came for injection.  Patient has low BP 70/49 (was taken in both arms), lethargic. Patients daughter was saying that she has a call in to doctor to see about an antibiotic for UTI. Injection not given- patient transported to ED and reported to EMT.

## 2022-10-04 NOTE — ED Provider Notes (Signed)
I received this patient in handoff from previous provider Myna Bright.  Please see his note for original history and physical exam.  In short patient is a 71 year old female today presenting from infusion center after hypotensive episode.  SBP was in the 70s and patient was complaining of dizziness.  When she arrived here she was treated with IV fluids and became normotensive.  She does have a Foley catheter bag that appeared foggy so UA and labs were ordered.  Labs worse however patient declines dialysis and this is why she receives outpatient infusions instead.  On physical exam patient had disconjugate gaze per attending physician so MRI was ordered.  At this time the MRI is pending.  Plan is for me to disposition accordingly.  With a negative MRI patient will be ambulated, have orthostatics and hopefully be discharged home.  With any abnormalities neurology will be consulted.  Physical Exam  BP (!) 155/73   Pulse (!) 58   Temp 98 F (36.7 C) (Oral)   Resp 15   SpO2 100%   Physical Exam Vitals and nursing note reviewed.  Constitutional:      Appearance: Normal appearance. She is ill-appearing (chronically ill appearing).  HENT:     Head: Normocephalic and atraumatic.  Eyes:     General: Scleral icterus present.     Conjunctiva/sclera: Conjunctivae normal.  Pulmonary:     Effort: Pulmonary effort is normal. No respiratory distress.  Musculoskeletal:     Right lower leg: No edema.     Left lower leg: No edema.  Skin:    General: Skin is warm and dry.     Findings: No rash.  Neurological:     Mental Status: She is alert.  Psychiatric:        Mood and Affect: Mood normal.     Procedures  Procedures  ED Course / MDM   Clinical Course as of 10/04/22 1518  Fri Oct 04, 2022  1516 CBC with Differential(!) There is evidence of anemia disease.  The patient's baseline. [CF]  1517 Comprehensive metabolic panel(!) There is evidence of elevated glucose and elevated creatinine in  setting of chronic kidney disease.  This is worsened than her baseline. [CF]  1517 Urinalysis, Routine w reflex microscopic(!) No signs of UTI.  [CF]  1518 Lactic acid, plasma Normal. [CF]    Clinical Course User Index [CF] Hendricks Limes, [name]   Medical Decision Making Amount and/or Complexity of Data Reviewed Labs:  Decision-making details documented in ED Course. Radiology: ordered.  Risk Prescription drug management.   3:30pm: I spoke with the patient and her family at bedside.  We discussed she is a nodule unable to decide whether or not she wants an MRI.  She says that she does not want to go through the MRI machine.  We also discussed her worsening kidney function.  She continues to say that she does not want dialysis.  I also discussed that her urinalysis not show UTI and that the proteinuria that they noted on her online portal is secondary to failing kidneys.  No antibiotic treatment indicated at this time.  We also discussed that we are unable to give her injection from the emergency department and that she may follow-up outpatient with the infusion center.  3:50pm: Lab changed patient's urinalysis to greater than 50 WBCs and many bacteria.  In the setting of patient's symptoms and caregiver concern she will be treated for urinary tract infection.  Patient's family member is wondering if torsemide  and metolazone.  I spoke with Dr. Tamera Punt who suggests holding them for one day. This was discussed with the family and attached to their discharge papers.    Darliss Ridgel 10/04/22 1641    Fransico Meadow, MD 10/05/22 [phone]

## 2022-10-04 NOTE — ED Triage Notes (Signed)
Pt arrived from the infusion clinic c/o hypotension. Pt is pale and fatigued.

## 2022-10-08 LAB — URINE CULTURE: Culture: >=100000 — AB

## 2022-10-09 ENCOUNTER — Telehealth (HOSPITAL_BASED_OUTPATIENT_CLINIC_OR_DEPARTMENT_OTHER): Payer: Self-pay

## 2022-10-09 NOTE — Telephone Encounter (Signed)
Post ED Visit - Positive Culture Follow-up: Successful Patient Follow-Up  Culture assessed and recommendations reviewed by:  [x]  Erskine Speed, Pharm.D. []  Heide Guile, Pharm.D., BCPS AQ-ID []  Parks Neptune, Pharm.D., BCPS []  Alycia Rossetti, Pharm.D., BCPS []  [place], Pharm.D., BCPS, AAHIVP []  Legrand Como, Pharm.D., BCPS, AAHIVP []  Salome Arnt, PharmD, BCPS []  Johnnette Gourd, PharmD, BCPS []  Hughes Better, PharmD, BCPS []  Leeroy Cha, PharmD  Positive urine culture  []  Patient discharged without antimicrobial prescription and treatment is now indicated [x]  Organism is resistant to prescribed ED discharge antimicrobial []  Patient with positive blood cultures  Changes discussed with ED provider: [name], MD New antibiotic prescription Bactrim SS 400mg /50 mg po daily x 3 days  Called to [facility] on [place] patient, date 10/09/2022, time 12:29 pm   Kara Mullins 10/09/2022, 12:29 PM

## 2022-10-09 NOTE — Progress Notes (Signed)
ED Antimicrobial Stewardship Positive Culture Follow Up   Kara Mullins is an 71 y.o. female who presented to [facility] on 10/04/2022 with a chief complaint of  Chief Complaint  Patient presents with   Hypotension    Recent Results (from the past 720 hour(s))  Urine Culture     Status: Abnormal   Collection Time: 10/04/22 12:50 PM   Specimen: In/Out Cath Urine  Result Value Ref Range Status   Specimen Description IN/OUT CATH URINE  Final   Special Requests   Final    NONE Performed at [facility], [address], [place], [place] [address]    Culture (A)  Final    >=100,000 COLONIES/mL CITROBACTER FREUNDII >=100,000 COLONIES/mL HAFNIA SPECIES    Report Status 10/08/2022 FINAL  Final   Organism ID, Bacteria CITROBACTER FREUNDII (A)  Final   Organism ID, Bacteria HAFNIA SPECIES (A)  Final      Susceptibility   Citrobacter freundii - MIC*    CEFAZOLIN >=64 RESISTANT Resistant     CEFEPIME 0.5 SENSITIVE Sensitive     CEFTRIAXONE >=64 RESISTANT Resistant     CIPROFLOXACIN 1 RESISTANT Resistant     GENTAMICIN <=1 SENSITIVE Sensitive     IMIPENEM <=0.25 SENSITIVE Sensitive     NITROFURANTOIN <=16 SENSITIVE Sensitive     TRIMETH/SULFA <=20 SENSITIVE Sensitive     PIP/TAZO >=128 RESISTANT Resistant     * >=100,000 COLONIES/mL CITROBACTER FREUNDII   Hafnia species - MIC*    AMPICILLIN RESISTANT Resistant     CEFAZOLIN >=64 RESISTANT Resistant     CEFTRIAXONE 1 SENSITIVE Sensitive     CIPROFLOXACIN <=0.25 SENSITIVE Sensitive     GENTAMICIN <=1 SENSITIVE Sensitive     IMIPENEM 0.5 SENSITIVE Sensitive     NITROFURANTOIN <=16 SENSITIVE Sensitive     TRIMETH/SULFA <=20 SENSITIVE Sensitive     AMPICILLIN/SULBACTAM >=32 RESISTANT Resistant     PIP/TAZO 64 INTERMEDIATE Intermediate     * >=100,000 COLONIES/mL HAFNIA SPECIES    [x]  Treated with cephalexin, organism resistant to prescribed antimicrobial []  Patient discharged originally without antimicrobial agent and  treatment is now indicated  New antibiotic prescription: Bactrim SS 400mg /80mg  1 tablet PO daily x 3 days  ED Provider: [name], MD   Merrilee Jansky, PharmD 10/09/2022, 2:26 PM Clinical Pharmacist Monday - Friday phone -  [phone] Saturday - Sunday phone - [phone]

## 2022-10-15 ENCOUNTER — Telehealth: Payer: Self-pay

## 2022-10-15 ENCOUNTER — Ambulatory Visit: Payer: Self-pay

## 2022-10-15 DIAGNOSIS — R2681 Unsteadiness on feet: Secondary | ICD-10-CM

## 2022-10-15 NOTE — Telephone Encounter (Signed)
Patient's daughter called requesting order for sit to stand sent to [facility]. She stated that PT suggested this.  Message routed to Marlowe Sax, NP

## 2022-10-15 NOTE — Patient Outreach (Signed)
  Care Coordination   10/15/2022 Name: Kara Mullins MRN: [id number] DOB: [birth year]   Care Coordination Outreach Attempts:  An unsuccessful telephone outreach was attempted for a scheduled appointment today.  Follow Up Plan:  Additional outreach attempts will be made to offer the patient care coordination information and services.   Encounter Outcome:  No Answer   Care Coordination Interventions:  No, not indicated    Barb Merino, RN, BSN, CCM Care Management Coordinator [facility] Care Management Direct Phone: [phone]

## 2022-10-16 DIAGNOSIS — D631 Anemia in chronic kidney disease: Secondary | ICD-10-CM | POA: Diagnosis not present

## 2022-10-16 DIAGNOSIS — I5033 Acute on chronic diastolic (congestive) heart failure: Secondary | ICD-10-CM | POA: Diagnosis not present

## 2022-10-16 DIAGNOSIS — I132 Hypertensive heart and chronic kidney disease with heart failure and with stage 5 chronic kidney disease, or end stage renal disease: Secondary | ICD-10-CM | POA: Diagnosis not present

## 2022-10-16 DIAGNOSIS — I509 Heart failure, unspecified: Secondary | ICD-10-CM | POA: Diagnosis not present

## 2022-10-16 DIAGNOSIS — E785 Hyperlipidemia, unspecified: Secondary | ICD-10-CM | POA: Diagnosis not present

## 2022-10-16 DIAGNOSIS — Z9181 History of falling: Secondary | ICD-10-CM | POA: Diagnosis not present

## 2022-10-16 DIAGNOSIS — N185 Chronic kidney disease, stage 5: Secondary | ICD-10-CM | POA: Diagnosis not present

## 2022-10-16 DIAGNOSIS — F411 Generalized anxiety disorder: Secondary | ICD-10-CM | POA: Diagnosis not present

## 2022-10-16 DIAGNOSIS — E162 Hypoglycemia, unspecified: Secondary | ICD-10-CM | POA: Diagnosis not present

## 2022-10-16 DIAGNOSIS — R946 Abnormal results of thyroid function studies: Secondary | ICD-10-CM | POA: Diagnosis not present

## 2022-10-16 DIAGNOSIS — R8281 Pyuria: Secondary | ICD-10-CM | POA: Diagnosis not present

## 2022-10-16 NOTE — Telephone Encounter (Signed)
Sit to stand lift ordered.please fax to [facility] or [facility] can pickup.

## 2022-10-17 NOTE — Telephone Encounter (Signed)
Sit to stand order faxed 10/16/2022 to [facility]

## 2022-10-18 DIAGNOSIS — R531 Weakness: Secondary | ICD-10-CM | POA: Diagnosis not present

## 2022-10-18 DIAGNOSIS — U071 COVID-19: Secondary | ICD-10-CM | POA: Diagnosis not present

## 2022-10-18 DIAGNOSIS — N1832 Chronic kidney disease, stage 3b: Secondary | ICD-10-CM | POA: Diagnosis not present

## 2022-10-18 DIAGNOSIS — I5042 Chronic combined systolic (congestive) and diastolic (congestive) heart failure: Secondary | ICD-10-CM | POA: Diagnosis not present

## 2022-10-18 DIAGNOSIS — A419 Sepsis, unspecified organism: Secondary | ICD-10-CM | POA: Diagnosis not present

## 2022-10-18 DIAGNOSIS — N185 Chronic kidney disease, stage 5: Secondary | ICD-10-CM | POA: Diagnosis not present

## 2022-10-22 ENCOUNTER — Telehealth: Payer: Self-pay

## 2022-10-22 NOTE — Telephone Encounter (Signed)
[facility] [phone] called and stated that they need OV notes stating the medical need for the Sit to Stand Lift faxed to them at Fax: [phone]  No recent [facility] note.   Please Advise.

## 2022-10-22 NOTE — Telephone Encounter (Signed)
LMOM for patient to return call.

## 2022-10-22 NOTE — Progress Notes (Signed)
Rescheduled 11/06/22  .Kara Mullins  Care Coordination Care Guide  Direct Dial: [phone]  

## 2022-10-22 NOTE — Telephone Encounter (Signed)
1652- Palliative Care Note  RN attempted to call pt to schedule home visit. No answer. LVM with contact info and request for call back.   Jacqulyn Cane, RN

## 2022-10-22 NOTE — Telephone Encounter (Signed)
Will need to schedule appointment for sit to stand lift.

## 2022-10-24 NOTE — Telephone Encounter (Signed)
noted 

## 2022-10-24 NOTE — Telephone Encounter (Signed)
Daughter scheduled a MyChart Face to Face Visit due to the difficulty of getting patient out.  Appointment scheduled for Monday 10/28/22.

## 2022-10-28 ENCOUNTER — Telehealth: Payer: Self-pay

## 2022-10-28 ENCOUNTER — Encounter: Payer: Self-pay | Admitting: Family

## 2022-10-28 ENCOUNTER — Telehealth (INDEPENDENT_AMBULATORY_CARE_PROVIDER_SITE_OTHER): Payer: Medicare (Managed Care) | Admitting: Family

## 2022-10-28 DIAGNOSIS — R5381 Other malaise: Secondary | ICD-10-CM

## 2022-10-28 DIAGNOSIS — I5042 Chronic combined systolic (congestive) and diastolic (congestive) heart failure: Secondary | ICD-10-CM

## 2022-10-28 DIAGNOSIS — I10 Essential (primary) hypertension: Secondary | ICD-10-CM

## 2022-10-28 DIAGNOSIS — R2681 Unsteadiness on feet: Secondary | ICD-10-CM | POA: Diagnosis not present

## 2022-10-28 NOTE — Telephone Encounter (Signed)
Patient was just seen today.

## 2022-10-28 NOTE — Telephone Encounter (Signed)
Incoming call received from [place] with [facility] stating order for sit to stand lift will not be processed without a supporting office visit note.  Kara Mullins was informed that note remains open, however I will notify [name], [name], NP and her medical assistant about request. Note to be faxed to number in contact section of chart

## 2022-10-28 NOTE — Progress Notes (Signed)
This service is provided via telemedicine  No vital signs collected/recorded due to the encounter was a telemedicine visit.   Location of patient (ex: home, work):  Home  Patient consents to a telephone visit:  Yes  Location of the provider (ex: office, home):  [employer].   Name of any referring provider:  Tahnee Cifuentes, [name], NP   Names of all persons participating in the telemedicine service and their role in the encounter:  Patient, Daughter Finleigh Cheong, [place], [place], Hae Ahlers, [place], NP.    Time spent on call: 8 minutes spent on the phone with Medical Assistant.     Provider: Marlowe Sax FNP-C  Rayansh Herbst, [name], NP  Patient Care Team: Malillany Kazlauskas, [name], NP as PCP - General (Family Medicine) Lynne Logan, RN as [place] Management  Extended Emergency Contact Information Primary Emergency Contact: [facility] Mobile Phone: [phone] Relation: Daughter Secondary Emergency Contact: Chamberlin,Curtis Mobile Phone: [phone] Relation: Son  Code Status:  Full Code  Goals of care: Advanced Directive information    10/28/2022    9:44 AM  Advanced Directives  Does Patient Have a Medical Advance Directive? Yes  Type of Paramedic of [place];Living will;Out of facility DNR (pink MOST or yellow form)  Does patient want to make changes to medical advance directive? No - Patient declined  Copy of [place] in Chart? Yes - validated most recent copy scanned in chart (See row information)     Chief Complaint  Patient presents with   Acute Visit    Patient daughter states she needs office visit notes that states patient needs sit to stand lift.    HPI:  Pt is a 72 y.o. female seen today for an acute visit for evaluation of sit to stand lift.States was used lift while she was in the hospital and preferred it.Has good strength on upper extremities to hold onto the  lift.  Also request DME Power wheelchair to allow her to maintain current level of independence with ADL's which cannot be achieved with Scooter,Rolling walker or cane. Patient suffers from chronic Congestive Heart failure, unsteady gait which impairs her ability to perform daily activities like bathing,walking,dressing,grooming and toileting in the home. Has strong upper extremity grip but lower extremities weakness unable to self propel on a wheelchair or use a scooter. She has mental and physical ability to operate power wheelchair and willing to use in her home.Daughter also available to assist.    Past Medical History:  Diagnosis Date   Chronic combined systolic and diastolic congestive heart failure ([place]) 11/09/2021   Generalized anxiety disorder    Hyperlipidemia 11/09/2021   Hypertension    Stage 3b chronic kidney disease (CKD) ([place]) 11/09/2021   Past Surgical History:  Procedure Laterality Date   CATARACT EXTRACTION, BILATERAL     combined systolic and diastolic congestive heart failure       Allergies  Allergen Reactions   Crestor [Rosuvastatin] Other (See Comments)    "Felt like she couldn't swallow"    Outpatient Encounter Medications as of 10/28/2022  Medication Sig   carvedilol (COREG) 3.125 MG tablet Take 3.125 mg by mouth 2 (two) times daily with a meal.   cholecalciferol (VITAMIN D3) 25 MCG (1000 UNIT) tablet Take 1,000 Units by mouth daily.   Epoetin Alfa-epbx (RETACRIT IJ) Inject as directed every 30 (thirty) days.   feeding supplement (ENSURE ENLIVE / ENSURE PLUS) LIQD Take 237 mLs by mouth 2 (two) times  daily between meals.   hydrALAZINE (APRESOLINE) 50 MG tablet TAKE 1 TABLET(50 MG) BY MOUTH EVERY 8 HOURS   Multiple Vitamin (MULTIVITAMIN ADULT PO) Take 1 tablet by mouth daily.   sevelamer carbonate (RENVELA) 800 MG tablet Take 800 mg by mouth every evening.   [DISCONTINUED] b complex-vitamin c-folic acid (NEPHRO-VITE) 0.8 MG TABS tablet Take 1 tablet by mouth at  bedtime. (Patient not taking: Reported on 10/04/2022)   [DISCONTINUED] carvedilol (COREG) 6.25 MG tablet Take 2 tablets (12.5 mg total) by mouth 2 (two) times daily with a meal. (Patient not taking: Reported on 10/04/2022)   [DISCONTINUED] ferrous sulfate 325 (65 FE) MG tablet Take 1 tablet (325 mg total) by mouth daily. (Patient not taking: Reported on 10/04/2022)   [DISCONTINUED] metolazone (ZAROXOLYN) 5 MG tablet Take 5 mg by mouth See admin instructions. TAKE 1 TABLET BY MOUTH WEEKLY AS DIRECTED 30 MINUTES BEFORE AM DOSE OF TORESEMIDE   [DISCONTINUED] torsemide 40 MG TABS Take 80 mg by mouth 2 (two) times daily.   No facility-administered encounter medications on file as of 10/28/2022.    Review of Systems  Constitutional:  Negative for appetite change, chills, fatigue, fever and unexpected weight change.  HENT:  Negative for congestion, ear discharge, ear pain, hearing loss, nosebleeds, postnasal drip, rhinorrhea, sinus pressure, sinus pain, sneezing, sore throat, tinnitus and trouble swallowing.   Eyes:  Negative for pain, discharge, redness, itching and visual disturbance.  Respiratory:  Negative for cough, chest tightness, shortness of breath and wheezing.   Cardiovascular:  Negative for chest pain, palpitations and leg swelling.  Gastrointestinal:  Negative for abdominal distention, abdominal pain, blood in stool, constipation, diarrhea, nausea and vomiting.  Musculoskeletal:  Positive for gait problem. Negative for arthralgias, back pain, joint swelling, myalgias, neck pain and neck stiffness.  Skin:  Negative for color change and pallor.  Neurological:  Negative for dizziness, syncope, speech difficulty, light-headedness, numbness and headaches.  Psychiatric/Behavioral:  Negative for agitation, behavioral problems, confusion, hallucinations and sleep disturbance. The patient is not nervous/anxious.     Immunization History  Administered Date(s) Administered   PFIZER Comirnaty(Gray  Top)Covid-19 Tri-Sucrose Vaccine 04/28/2021   PFIZER(Purple Top)SARS-COV-2 Vaccination 12/16/2019, 01/12/2020   Unspecified SARS-COV-2 Vaccination 08/21/2021   Zoster Recombinat (Shingrix) 07/09/2021, 08/29/2021   Pertinent  Health Maintenance Due  Topic Date Due   COLONOSCOPY (Pts 45-4yrs Insurance coverage will need to be confirmed)  Never done   MAMMOGRAM  Never done   DEXA SCAN  Never done   INFLUENZA VACCINE  Never done      08/17/2022    8:00 PM 08/18/2022   11:15 AM 10/04/2022   11:22 AM 10/04/2022   11:50 AM 10/28/2022    9:43 AM  Fall Risk  Falls in the past year?     0  Was there an injury with Fall?     0  Fall Risk Category Calculator     0  Fall Risk Category (Retired)     Low  (RETIRED) Patient Fall Risk Level Moderate fall risk Moderate fall risk High fall risk High fall risk   Patient at Risk for Falls Due to     No Fall Risks  Fall risk Follow up     Falls evaluation completed;Education provided;Falls prevention discussed   Functional Status Survey:    There were no vitals filed for this visit. There is no height or weight on file to calculate BMI. Physical Exam Pulmonary:     Effort: Pulmonary effort is normal. No respiratory  distress.  Neurological:     Mental Status: She is alert and oriented to person, place, and time.     Gait: Gait abnormal.  Psychiatric:        Mood and Affect: Mood normal.        Behavior: Behavior normal.     Labs reviewed: Recent Labs    06/17/22 0231 08/14/22 1930 08/14/22 2133 08/15/22 0330 08/16/22 0444 08/17/22 0055 08/18/22 0046 08/23/22 0000 10/04/22 1249  NA 142   < >  --  143 142 138 137 143 139  K 3.7   < >  --  3.9 4.0 3.8 3.8 3.9 3.6  CL 110   < >  --  115* 110 113* 108 111* 105  CO2 23   < >  --  19* 18* 18* 17* 16 19*  GLUCOSE 82   < >  --  78 68* 98 94  --  138*  BUN 91*   < >  --  107* 110* 106* 106* 106* 126*  CREATININE 4.93*   < >  --  5.20* 5.23* 5.39* 5.12* 5.5* 6.53*  CALCIUM 7.9*   < >  --   7.4* 7.4* 7.1* 7.2* 7.4* 7.3*  MG 2.4  --  2.5* 2.5*  --   --   --   --   --   PHOS 8.4*  --   --  8.5* 8.6* 8.7* 8.7*  --   --    < > = values in this interval not displayed.   Recent Labs    08/14/22 1930 08/15/22 0330 08/16/22 0444 08/18/22 0046 08/23/22 0000 10/04/22 1249  AST 35 27  --   --   --  29  ALT 27 21  --   --   --  21  ALKPHOS 114 101  --   --   --  91  BILITOT 0.7 0.3  --   --   --  0.3  PROT 6.3* 5.3*  --   --   --  6.2*  ALBUMIN 2.1* 1.7*   < > 1.7* 2.5* 1.9*   < > = values in this interval not displayed.   Recent Labs    08/14/22 1930 08/15/22 0330 08/15/22 1819 08/16/22 0444 08/23/22 0000 10/04/22 1249  WBC 4.6 4.5 4.9 4.3  --  6.1  NEUTROABS 2.9 2.8  --   --   --  4.2  HGB 9.0* 7.2* 7.8* 7.5* 8.3* 8.1*  HCT 27.7* 23.6* 24.7* 22.6*  --  26.0*  MCV 92.3 95.2 94.3 91.1  --  97.4  PLT 160 150 148* 139*  --  236   Lab Results  Component Value Date   TSH 6.306 (H) 08/14/2022   No results found for: "HGBA1C" Lab Results  Component Value Date   CHOL 212 (H) 08/27/2021   HDL 67 08/27/2021   LDLCALC 128 (H) 08/27/2021   TRIG 78 08/27/2021   CHOLHDL 3.2 08/27/2021    Significant Diagnostic Results in last 30 days:  No results found.  Assessment/Plan  1. Debility Requires sit to stand lift to assist with transfer in and out of the bed and chair. - For home use only DME Other see comment - DME Wheelchair electric: Power wheelchair to allow her to maintain current level of independence with ADL's which cannot be achieved with Scooter,Rolling walker or cane. Patient suffers from chronic Congestive Heart failure, unsteady gait which impairs her ability to perform daily activities like bathing,walking,dressing,grooming and  toileting in the home. Has strong upper extremity grip but lower extremities weakness unable to self propel on a wheelchair or use a scooter. She has mental and physical ability to operate power wheelchair and willing to use in her  home.Daughter also available to assist.  2. Unsteady gait No recent fall episode in the past few weeks. - For home use only DME Other see comment: Sit and stand lift to assist with transfer in and out of chair /bed  - DME Wheelchair electric as above   3. Essential hypertension B/p well controlled  -continue on Hydralazine and Coreg  Advised to hold medication if B/p < 100/60   4. Chronic combined systolic and diastolic CHF (congestive heart failure) (HCC) No signs of fluid overload reported. - DME Wheelchair electric: requires power wheelchair as above  - continue on Furosemide as needed  - continue on Hydralazine and Coreg   Family/ staff Communication: Reviewed plan of care with patient and daughter verbalized understanding.   Labs/tests ordered: None   Next Appointment: Return if symptoms worsen or fail to improve.  I connected with  [name] on 10/28/22 by a video enabled telemedicine application and verified that I am speaking with the correct person using two identifiers.   I discussed the limitations of evaluation and management by telemedicine. The patient expressed understanding and agreed to proceed.   Spent 17 minutes of  face to face with patient  >50% time spent counseling; reviewing medical record and developing future plan of care.   [name], NP

## 2022-10-29 DIAGNOSIS — I480 Paroxysmal atrial fibrillation: Secondary | ICD-10-CM | POA: Diagnosis not present

## 2022-10-29 DIAGNOSIS — N185 Chronic kidney disease, stage 5: Secondary | ICD-10-CM | POA: Diagnosis not present

## 2022-10-29 DIAGNOSIS — R768 Other specified abnormal immunological findings in serum: Secondary | ICD-10-CM | POA: Diagnosis not present

## 2022-10-29 DIAGNOSIS — E876 Hypokalemia: Secondary | ICD-10-CM | POA: Diagnosis not present

## 2022-10-29 DIAGNOSIS — D631 Anemia in chronic kidney disease: Secondary | ICD-10-CM | POA: Diagnosis not present

## 2022-10-29 DIAGNOSIS — I5032 Chronic diastolic (congestive) heart failure: Secondary | ICD-10-CM | POA: Diagnosis not present

## 2022-10-29 DIAGNOSIS — I120 Hypertensive chronic kidney disease with stage 5 chronic kidney disease or end stage renal disease: Secondary | ICD-10-CM | POA: Diagnosis not present

## 2022-10-29 LAB — BASIC METABOLIC PANEL
BUN: 92 — AB (ref 4–21)
CO2: 15 (ref 13–22)
Chloride: 115 — AB (ref 99–108)
Creatinine: 4.3 — AB (ref 0.5–1.1)
Glucose: 73
Potassium: 3.9 mEq/L (ref 3.5–5.1)
Sodium: 139 (ref 137–147)

## 2022-10-29 LAB — COMPREHENSIVE METABOLIC PANEL
Albumin: 2.7 — AB (ref 3.5–5.0)
Calcium: 7.4 — AB (ref 8.7–10.7)
eGFR: 10

## 2022-10-29 LAB — CBC AND DIFFERENTIAL: Hemoglobin: 7.5 — AB (ref 12.0–16.0)

## 2022-10-29 NOTE — Telephone Encounter (Signed)
Office note was routed/fax via epic to the number provided in the contact information

## 2022-10-30 DIAGNOSIS — F411 Generalized anxiety disorder: Secondary | ICD-10-CM | POA: Diagnosis not present

## 2022-10-30 DIAGNOSIS — N185 Chronic kidney disease, stage 5: Secondary | ICD-10-CM | POA: Diagnosis not present

## 2022-10-30 DIAGNOSIS — E785 Hyperlipidemia, unspecified: Secondary | ICD-10-CM | POA: Diagnosis not present

## 2022-10-30 DIAGNOSIS — Z9181 History of falling: Secondary | ICD-10-CM | POA: Diagnosis not present

## 2022-10-30 DIAGNOSIS — D631 Anemia in chronic kidney disease: Secondary | ICD-10-CM | POA: Diagnosis not present

## 2022-10-30 DIAGNOSIS — Z79899 Other long term (current) drug therapy: Secondary | ICD-10-CM | POA: Diagnosis not present

## 2022-10-30 DIAGNOSIS — N390 Urinary tract infection, site not specified: Secondary | ICD-10-CM | POA: Diagnosis not present

## 2022-10-30 DIAGNOSIS — R946 Abnormal results of thyroid function studies: Secondary | ICD-10-CM | POA: Diagnosis not present

## 2022-10-30 DIAGNOSIS — I5033 Acute on chronic diastolic (congestive) heart failure: Secondary | ICD-10-CM | POA: Diagnosis not present

## 2022-10-30 DIAGNOSIS — E162 Hypoglycemia, unspecified: Secondary | ICD-10-CM | POA: Diagnosis not present

## 2022-10-30 DIAGNOSIS — I132 Hypertensive heart and chronic kidney disease with heart failure and with stage 5 chronic kidney disease, or end stage renal disease: Secondary | ICD-10-CM | POA: Diagnosis not present

## 2022-10-30 NOTE — Telephone Encounter (Signed)
Late entry papers faxed to [employer] with orders 10/29/2022

## 2022-11-01 ENCOUNTER — Encounter (HOSPITAL_COMMUNITY): Payer: Medicare (Managed Care)

## 2022-11-05 ENCOUNTER — Encounter: Payer: Self-pay | Admitting: Family

## 2022-11-06 DIAGNOSIS — I5033 Acute on chronic diastolic (congestive) heart failure: Secondary | ICD-10-CM | POA: Diagnosis not present

## 2022-11-06 DIAGNOSIS — N185 Chronic kidney disease, stage 5: Secondary | ICD-10-CM | POA: Diagnosis not present

## 2022-11-06 DIAGNOSIS — I132 Hypertensive heart and chronic kidney disease with heart failure and with stage 5 chronic kidney disease, or end stage renal disease: Secondary | ICD-10-CM | POA: Diagnosis not present

## 2022-11-06 DIAGNOSIS — E162 Hypoglycemia, unspecified: Secondary | ICD-10-CM | POA: Diagnosis not present

## 2022-11-06 DIAGNOSIS — N390 Urinary tract infection, site not specified: Secondary | ICD-10-CM | POA: Diagnosis not present

## 2022-11-06 DIAGNOSIS — E785 Hyperlipidemia, unspecified: Secondary | ICD-10-CM | POA: Diagnosis not present

## 2022-11-06 DIAGNOSIS — Z79899 Other long term (current) drug therapy: Secondary | ICD-10-CM | POA: Diagnosis not present

## 2022-11-06 DIAGNOSIS — R946 Abnormal results of thyroid function studies: Secondary | ICD-10-CM | POA: Diagnosis not present

## 2022-11-06 DIAGNOSIS — D631 Anemia in chronic kidney disease: Secondary | ICD-10-CM | POA: Diagnosis not present

## 2022-11-06 DIAGNOSIS — F411 Generalized anxiety disorder: Secondary | ICD-10-CM | POA: Diagnosis not present

## 2022-11-06 DIAGNOSIS — Z9181 History of falling: Secondary | ICD-10-CM | POA: Diagnosis not present

## 2022-11-07 ENCOUNTER — Ambulatory Visit: Payer: Self-pay

## 2022-11-07 NOTE — Patient Outreach (Signed)
  Care Coordination   Follow Up Visit Note   11/07/2022 Name: Kara Mullins MRN: [id number] DOB: Jun 09, 1951  Kara Mullins is a 72 y.o. year old female who sees [name], [name], NP for primary care. I  spoke with patients daughter and caregiver [name] by phone  What matters to the patients health and wellness today?  I want to make sure she is still on the in home aid waitlist    Goals Addressed             This Visit's Progress    Care Coordination Activities       Care Coordination Interventions: Determined patients daughter is concerned the patient may have been taken off the in home aid wait list as she forgot to respond to a mailed letter  Discussed plan for SW to collaborate with the in home aid program to confirm status Collaboration with Dorthy Cooler requesting feedback on if patient is still on the wait list Daughter reports she would like home health to assist with injections patient receives bi-weekly Ree Shay to contact ordering provider who performs injections to request home health orders be sent to alleviate trips to the clinic         SDOH assessments and interventions completed:  No     Care Coordination Interventions:  Yes, provided   Follow up plan:  SW will continue to follow    Encounter Outcome:  Pt. Visit Completed   Daneen Schick, Arita Miss, CDP Social Worker, Certified Dementia Practitioner [facility] Care Management  Care Coordination [phone]

## 2022-11-07 NOTE — Patient Instructions (Signed)
Visit Information  Thank you for taking time to visit with me today. Please don't hesitate to contact me if I can be of assistance to you.   Following are the goals we discussed today:   Goals Addressed             This Visit's Progress    Care Coordination Activities       Care Coordination Interventions: Determined patients daughter is concerned the patient may have been taken off the in home aid wait list as she forgot to respond to a mailed letter  Discussed plan for SW to collaborate with the in home aid program to confirm status Collaboration with Dorthy Cooler requesting feedback on if patient is still on the wait list Daughter reports she would like home health to assist with injections patient receives bi-weekly Ree Shay to contact ordering provider who performs injections to request home health orders be sent to alleviate trips to the clinic         If you are experiencing a Mental Health or [place] or need someone to talk to, please call 911  Patient verbalizes understanding of instructions and care plan provided today and agrees to view in [place]. Active MyChart status and patient understanding of how to access instructions and care plan via MyChart confirmed with patient.     I will contact you once I hear back about the wait list status  Daneen Schick, Arita Miss, CDP Social Worker, Certified Dementia Practitioner [place] Coordination [phone]

## 2022-11-08 DIAGNOSIS — I5033 Acute on chronic diastolic (congestive) heart failure: Secondary | ICD-10-CM | POA: Diagnosis not present

## 2022-11-08 DIAGNOSIS — F411 Generalized anxiety disorder: Secondary | ICD-10-CM | POA: Diagnosis not present

## 2022-11-08 DIAGNOSIS — R946 Abnormal results of thyroid function studies: Secondary | ICD-10-CM | POA: Diagnosis not present

## 2022-11-08 DIAGNOSIS — I132 Hypertensive heart and chronic kidney disease with heart failure and with stage 5 chronic kidney disease, or end stage renal disease: Secondary | ICD-10-CM | POA: Diagnosis not present

## 2022-11-08 DIAGNOSIS — D631 Anemia in chronic kidney disease: Secondary | ICD-10-CM | POA: Diagnosis not present

## 2022-11-08 DIAGNOSIS — Z79899 Other long term (current) drug therapy: Secondary | ICD-10-CM | POA: Diagnosis not present

## 2022-11-08 DIAGNOSIS — N390 Urinary tract infection, site not specified: Secondary | ICD-10-CM | POA: Diagnosis not present

## 2022-11-08 DIAGNOSIS — Z9181 History of falling: Secondary | ICD-10-CM | POA: Diagnosis not present

## 2022-11-08 DIAGNOSIS — N185 Chronic kidney disease, stage 5: Secondary | ICD-10-CM | POA: Diagnosis not present

## 2022-11-08 DIAGNOSIS — E162 Hypoglycemia, unspecified: Secondary | ICD-10-CM | POA: Diagnosis not present

## 2022-11-08 DIAGNOSIS — E785 Hyperlipidemia, unspecified: Secondary | ICD-10-CM | POA: Diagnosis not present

## 2022-11-13 ENCOUNTER — Telehealth: Payer: Self-pay

## 2022-11-13 DIAGNOSIS — R5381 Other malaise: Secondary | ICD-10-CM

## 2022-11-13 DIAGNOSIS — D631 Anemia in chronic kidney disease: Secondary | ICD-10-CM | POA: Diagnosis not present

## 2022-11-13 DIAGNOSIS — I5042 Chronic combined systolic (congestive) and diastolic (congestive) heart failure: Secondary | ICD-10-CM

## 2022-11-13 DIAGNOSIS — E785 Hyperlipidemia, unspecified: Secondary | ICD-10-CM | POA: Diagnosis not present

## 2022-11-13 DIAGNOSIS — Z9181 History of falling: Secondary | ICD-10-CM | POA: Diagnosis not present

## 2022-11-13 DIAGNOSIS — N185 Chronic kidney disease, stage 5: Secondary | ICD-10-CM | POA: Diagnosis not present

## 2022-11-13 DIAGNOSIS — F411 Generalized anxiety disorder: Secondary | ICD-10-CM | POA: Diagnosis not present

## 2022-11-13 DIAGNOSIS — R946 Abnormal results of thyroid function studies: Secondary | ICD-10-CM | POA: Diagnosis not present

## 2022-11-13 DIAGNOSIS — E162 Hypoglycemia, unspecified: Secondary | ICD-10-CM | POA: Diagnosis not present

## 2022-11-13 DIAGNOSIS — R2681 Unsteadiness on feet: Secondary | ICD-10-CM

## 2022-11-13 DIAGNOSIS — N390 Urinary tract infection, site not specified: Secondary | ICD-10-CM | POA: Diagnosis not present

## 2022-11-13 DIAGNOSIS — I5033 Acute on chronic diastolic (congestive) heart failure: Secondary | ICD-10-CM | POA: Diagnosis not present

## 2022-11-13 DIAGNOSIS — I132 Hypertensive heart and chronic kidney disease with heart failure and with stage 5 chronic kidney disease, or end stage renal disease: Secondary | ICD-10-CM | POA: Diagnosis not present

## 2022-11-13 DIAGNOSIS — Z79899 Other long term (current) drug therapy: Secondary | ICD-10-CM | POA: Diagnosis not present

## 2022-11-13 NOTE — Telephone Encounter (Signed)
Patient's daughter called requesting order for manual wit to stand. The last order was for an electric one.   Message routed to Marlowe Sax, NP

## 2022-11-14 NOTE — Telephone Encounter (Signed)
Electric sit to stand lift ordered.please fax as requested.

## 2022-11-14 NOTE — Telephone Encounter (Signed)
They want the manual one not the electric one. They received the electric one,b ut wanted the manual one. Please placed order for manual sit to stand.

## 2022-11-14 NOTE — Telephone Encounter (Signed)
Manual sit to stand lift ordered.

## 2022-11-18 ENCOUNTER — Ambulatory Visit: Payer: Self-pay

## 2022-11-18 DIAGNOSIS — I5042 Chronic combined systolic (congestive) and diastolic (congestive) heart failure: Secondary | ICD-10-CM | POA: Diagnosis not present

## 2022-11-18 DIAGNOSIS — N185 Chronic kidney disease, stage 5: Secondary | ICD-10-CM | POA: Diagnosis not present

## 2022-11-18 DIAGNOSIS — N1832 Chronic kidney disease, stage 3b: Secondary | ICD-10-CM | POA: Diagnosis not present

## 2022-11-18 DIAGNOSIS — R531 Weakness: Secondary | ICD-10-CM | POA: Diagnosis not present

## 2022-11-18 DIAGNOSIS — U071 COVID-19: Secondary | ICD-10-CM | POA: Diagnosis not present

## 2022-11-18 DIAGNOSIS — A419 Sepsis, unspecified organism: Secondary | ICD-10-CM | POA: Diagnosis not present

## 2022-11-18 NOTE — Patient Outreach (Signed)
  Care Coordination   Follow Up Visit Note   11/18/2022 Name: Kara Mullins MRN: [id number] DOB: [birth year]  Kara Mullins is a 72 y.o. year old female who sees [name], [name], NP for primary care. I  collaborated with the [facility] in home aid program.   Goals Addressed             This Visit's Progress    Care Coordination Activities       Care Coordination Interventions: Successful outbound call placed to the [facility] in home aid program  Referral completed for patient to be placed back on wait list Discussed patient may still be on wait list from previous referral from 06/25/22 but worker is unable to access database to confirm SW will follow up with patients daughter over the next 2 weeks          SDOH assessments and interventions completed:  No     Care Coordination Interventions:  Yes, provided   Follow up plan: Follow up call scheduled for 2/19    Encounter Outcome:  Pt. Visit Completed   Daneen Schick, Arita Miss, CDP Social Worker, Certified Dementia Practitioner [place] Management  Care Coordination [phone]

## 2022-11-21 ENCOUNTER — Ambulatory Visit: Payer: Self-pay

## 2022-11-21 ENCOUNTER — Other Ambulatory Visit: Payer: Self-pay | Admitting: Family

## 2022-11-21 DIAGNOSIS — I132 Hypertensive heart and chronic kidney disease with heart failure and with stage 5 chronic kidney disease, or end stage renal disease: Secondary | ICD-10-CM | POA: Diagnosis not present

## 2022-11-21 DIAGNOSIS — Z79899 Other long term (current) drug therapy: Secondary | ICD-10-CM | POA: Diagnosis not present

## 2022-11-21 DIAGNOSIS — Z9181 History of falling: Secondary | ICD-10-CM | POA: Diagnosis not present

## 2022-11-21 DIAGNOSIS — N185 Chronic kidney disease, stage 5: Secondary | ICD-10-CM | POA: Diagnosis not present

## 2022-11-21 DIAGNOSIS — R946 Abnormal results of thyroid function studies: Secondary | ICD-10-CM | POA: Diagnosis not present

## 2022-11-21 DIAGNOSIS — N390 Urinary tract infection, site not specified: Secondary | ICD-10-CM | POA: Diagnosis not present

## 2022-11-21 DIAGNOSIS — E785 Hyperlipidemia, unspecified: Secondary | ICD-10-CM | POA: Diagnosis not present

## 2022-11-21 DIAGNOSIS — D631 Anemia in chronic kidney disease: Secondary | ICD-10-CM | POA: Diagnosis not present

## 2022-11-21 DIAGNOSIS — E162 Hypoglycemia, unspecified: Secondary | ICD-10-CM | POA: Diagnosis not present

## 2022-11-21 DIAGNOSIS — I5033 Acute on chronic diastolic (congestive) heart failure: Secondary | ICD-10-CM | POA: Diagnosis not present

## 2022-11-21 DIAGNOSIS — F411 Generalized anxiety disorder: Secondary | ICD-10-CM | POA: Diagnosis not present

## 2022-11-22 NOTE — Patient Outreach (Signed)
  Care Coordination   Follow Up Visit Note   11/22/2022 Name: Kara Mullins MRN: [id number] DOB: 03-Mar-1951  Kara Mullins is a 72 y.o. year old female who sees [name], [name], NP for primary care. I spoke with Darletta Moll by phone today.  What matters to the patients health and wellness today?  Patient will keep all scheduled MD appointments and report acute changes to her kidney doctor.     Goals Addressed             This Visit's Progress    I would like to keep my mom out of the hospital       Care Coordination Interventions: Assessed the daughter Kara Mullins understanding of chronic kidney disease    Evaluation of current treatment plan related to chronic kidney disease self management and patient's adherence to plan as established by provider   Determined patient experienced a recent ED visit due to having hypotension Determined daughter believes patient's diuretic dosing was too much for her, dropping her BP and causing lethargy  Determined patient's Nephrologist Dr. Harrie Jeans is managing patients medications at this time  Reviewed and discussed patient recent GFR <10, patient does not wish to go on dialysis    Provided education on kidney disease progression  Counseled daughter Kara Mullins, providing education and rationale, to monitor blood pressure daily and record, calling PCP for findings outside established parameters    Discussed the impact of chronic kidney disease on daily life and mental health and acknowledged and normalized feelings of disempowerment, fear, and frustration      Engage patient in early, proactive and ongoing discussion about goals of care and what matters most to them  Assessed social determinant of health barriers                SDOH assessments and interventions completed:  Yes  SDOH Interventions Today    Flowsheet Row Most Recent Value  SDOH Interventions   Transportation Interventions Intervention Not Indicated        Care  Coordination Interventions:  Yes, provided   Follow up plan: Follow up call scheduled for 12/20/22 @2 :30 PM    Encounter Outcome:  Pt. Visit Completed

## 2022-11-22 NOTE — Patient Instructions (Signed)
Visit Information  Thank you for taking time to visit with me today. Please don't hesitate to contact me if I can be of assistance to you.   Following are the goals we discussed today:   Goals Addressed             This Visit's Progress    I would like to keep my mom out of the hospital       Care Coordination Interventions: Assessed the daughter [name] understanding of chronic kidney disease    Evaluation of current treatment plan related to chronic kidney disease self management and patient's adherence to plan as established by provider   Determined patient experienced a recent ED visit due to having hypotension Determined daughter believes patient's diuretic dosing was too much for her, dropping her BP and causing lethargy  Determined patient's Nephrologist Dr. Harrie Jeans is managing patients medications at this time  Reviewed and discussed patient recent GFR <10, patient does not wish to go on dialysis    Provided education on kidney disease progression  Counseled daughter [name], providing education and rationale, to monitor blood pressure daily and record, calling PCP for findings outside established parameters    Discussed the impact of chronic kidney disease on daily life and mental health and acknowledged and normalized feelings of disempowerment, fear, and frustration      Engage patient in early, proactive and ongoing discussion about goals of care and what matters most to them  Assessed social determinant of health barriers                Our next appointment is by telephone on 12/20/22 at 2:30 PM  Please call the care guide team at [phone] if you need to cancel or reschedule your appointment.   If you are experiencing a Mental Health or [place] or need someone to talk to, please call 1-800-273-TALK (toll free, 24 hour hotline) go to [facility] [address] [place] [phone])  Patient verbalizes  understanding of instructions and care plan provided today and agrees to view in [place]. Active MyChart status and patient understanding of how to access instructions and care plan via MyChart confirmed with patient.     Barb Merino, RN, BSN, CCM Care Management Coordinator [facility] Care Management Direct Phone: [phone]

## 2022-11-26 DIAGNOSIS — Z9181 History of falling: Secondary | ICD-10-CM | POA: Diagnosis not present

## 2022-11-26 DIAGNOSIS — E162 Hypoglycemia, unspecified: Secondary | ICD-10-CM | POA: Diagnosis not present

## 2022-11-26 DIAGNOSIS — N390 Urinary tract infection, site not specified: Secondary | ICD-10-CM | POA: Diagnosis not present

## 2022-11-26 DIAGNOSIS — I5033 Acute on chronic diastolic (congestive) heart failure: Secondary | ICD-10-CM | POA: Diagnosis not present

## 2022-11-26 DIAGNOSIS — Z79899 Other long term (current) drug therapy: Secondary | ICD-10-CM | POA: Diagnosis not present

## 2022-11-26 DIAGNOSIS — N185 Chronic kidney disease, stage 5: Secondary | ICD-10-CM | POA: Diagnosis not present

## 2022-11-26 DIAGNOSIS — F411 Generalized anxiety disorder: Secondary | ICD-10-CM | POA: Diagnosis not present

## 2022-11-26 DIAGNOSIS — D631 Anemia in chronic kidney disease: Secondary | ICD-10-CM | POA: Diagnosis not present

## 2022-11-26 DIAGNOSIS — E785 Hyperlipidemia, unspecified: Secondary | ICD-10-CM | POA: Diagnosis not present

## 2022-11-26 DIAGNOSIS — R946 Abnormal results of thyroid function studies: Secondary | ICD-10-CM | POA: Diagnosis not present

## 2022-11-26 DIAGNOSIS — I132 Hypertensive heart and chronic kidney disease with heart failure and with stage 5 chronic kidney disease, or end stage renal disease: Secondary | ICD-10-CM | POA: Diagnosis not present

## 2022-11-28 ENCOUNTER — Encounter (HOSPITAL_COMMUNITY)
Admission: RE | Admit: 2022-11-28 | Discharge: 2022-11-28 | Disposition: A | Discharge status: Home / Self Care | Payer: Medicare (Managed Care) | Source: Ambulatory Visit | Attending: Nephrology | Admitting: Nephrology

## 2022-11-28 VITALS — BP 98/61 | HR 54 | Temp 97.8°F | Resp 17

## 2022-11-28 DIAGNOSIS — D631 Anemia in chronic kidney disease: Secondary | ICD-10-CM | POA: Insufficient documentation

## 2022-11-28 DIAGNOSIS — N185 Chronic kidney disease, stage 5: Secondary | ICD-10-CM | POA: Insufficient documentation

## 2022-11-28 DIAGNOSIS — E162 Hypoglycemia, unspecified: Secondary | ICD-10-CM | POA: Diagnosis not present

## 2022-11-28 DIAGNOSIS — Z9181 History of falling: Secondary | ICD-10-CM | POA: Diagnosis not present

## 2022-11-28 DIAGNOSIS — Z79899 Other long term (current) drug therapy: Secondary | ICD-10-CM | POA: Diagnosis not present

## 2022-11-28 DIAGNOSIS — N390 Urinary tract infection, site not specified: Secondary | ICD-10-CM | POA: Diagnosis not present

## 2022-11-28 DIAGNOSIS — R946 Abnormal results of thyroid function studies: Secondary | ICD-10-CM | POA: Diagnosis not present

## 2022-11-28 DIAGNOSIS — E785 Hyperlipidemia, unspecified: Secondary | ICD-10-CM | POA: Diagnosis not present

## 2022-11-28 DIAGNOSIS — I5033 Acute on chronic diastolic (congestive) heart failure: Secondary | ICD-10-CM | POA: Diagnosis not present

## 2022-11-28 DIAGNOSIS — I132 Hypertensive heart and chronic kidney disease with heart failure and with stage 5 chronic kidney disease, or end stage renal disease: Secondary | ICD-10-CM | POA: Diagnosis not present

## 2022-11-28 DIAGNOSIS — F411 Generalized anxiety disorder: Secondary | ICD-10-CM | POA: Diagnosis not present

## 2022-11-28 LAB — IRON AND TIBC
Iron: 34 ug/dL (ref 28–170)
Saturation Ratios: 22 % (ref 10.4–31.8)
TIBC: 154 ug/dL — ABNORMAL LOW (ref 250–450)
UIBC: 120 ug/dL

## 2022-11-28 LAB — FERRITIN: Ferritin: 622 ng/mL — ABNORMAL HIGH (ref 11–307)

## 2022-11-28 MED ORDER — EPOETIN ALFA-EPBX 10000 UNIT/ML IJ SOLN
INTRAMUSCULAR | Status: AC
  Filled 2022-11-28: qty 2

## 2022-11-28 MED ORDER — EPOETIN ALFA-EPBX 10000 UNIT/ML IJ SOLN
20000.0000 [IU] | INTRAMUSCULAR | Status: DC
  Administered 2022-11-28: 20000 [IU] via SUBCUTANEOUS

## 2022-11-28 NOTE — Progress Notes (Signed)
Amber at [employer] notified of pt's hemocue of 6.5.  Pt and family deny symptoms and deny seeing any bleeding. No changes to care at this time.  Family  and pt instructed to watch for bleeding and report to Dr Royce Macadamia ASAP if any noted.

## 2022-11-29 LAB — POCT HEMOGLOBIN-HEMACUE
Hemoglobin: 6.5 g/dL — CL (ref 12.0–15.0)
Hemoglobin: 6.6 g/dL — CL (ref 12.0–15.0)

## 2022-12-02 ENCOUNTER — Ambulatory Visit: Payer: Self-pay

## 2022-12-02 NOTE — Patient Outreach (Signed)
  Care Coordination   Follow Up Visit Note   12/02/2022 Name: Kara Mullins MRN: [id number] DOB: 12/21/1950  Kara Mullins is a 72 y.o. year old female who sees [name], [name], NP for primary care. I  spoke with patients daughter and primary caregiver Kara Mullins by phone.  What matters to the patients health and wellness today?  I need to obtain the sit to stand    Goals Addressed             This Visit's Progress    COMPLETED: Care Coordination Activities       Care Coordination Interventions: Advised daughter patient is on wait list with in home aid program Discussed patient is doing well; daughter has found sit to stand and plans to purchase soon Encouraged to contact SW as needed           SDOH assessments and interventions completed:  No     Care Coordination Interventions:  Yes, provided   Interventions Today    Flowsheet Row Most Recent Value  Chronic Disease   Chronic disease during today's visit Congestive Heart Failure (CHF), Chronic Kidney Disease/End Stage Renal Disease (ESRD), Hypertension (HTN)  General Interventions   General Interventions Discussed/Reviewed General Interventions Reviewed        Follow up plan: No further intervention required. Patient will remain engaged with [place].    Encounter Outcome:  Pt. Visit Completed   Daneen Schick, BSW, CDP Social Worker, Certified Dementia Practitioner [place] Management  Care Coordination [phone]

## 2022-12-02 NOTE — Patient Instructions (Signed)
Visit Information  Thank you for taking time to visit with me today. Please don't hesitate to contact me if I can be of assistance to you.   Following are the goals we discussed today:   Goals Addressed             This Visit's Progress    COMPLETED: Care Coordination Activities       Care Coordination Interventions: Advised daughter patient is on wait list with in home aid program Discussed patient is doing well; daughter has found sit to stand and plans to purchase soon Encouraged to contact SW as needed           If you are experiencing a Mental Health or [place] or need someone to talk to, please call 911  Patient verbalizes understanding of instructions and care plan provided today and agrees to view in [place]. Active MyChart status and patient understanding of how to access instructions and care plan via MyChart confirmed with patient.     No further follow up required: Please contact me as needed.  Daneen Schick, BSW, CDP Social Worker, Certified Dementia Practitioner [place] Management  Care Coordination [phone]

## 2022-12-03 DIAGNOSIS — I5033 Acute on chronic diastolic (congestive) heart failure: Secondary | ICD-10-CM | POA: Diagnosis not present

## 2022-12-03 DIAGNOSIS — N185 Chronic kidney disease, stage 5: Secondary | ICD-10-CM | POA: Diagnosis not present

## 2022-12-03 DIAGNOSIS — F411 Generalized anxiety disorder: Secondary | ICD-10-CM | POA: Diagnosis not present

## 2022-12-03 DIAGNOSIS — R946 Abnormal results of thyroid function studies: Secondary | ICD-10-CM | POA: Diagnosis not present

## 2022-12-03 DIAGNOSIS — N390 Urinary tract infection, site not specified: Secondary | ICD-10-CM | POA: Diagnosis not present

## 2022-12-03 DIAGNOSIS — E162 Hypoglycemia, unspecified: Secondary | ICD-10-CM | POA: Diagnosis not present

## 2022-12-03 DIAGNOSIS — I132 Hypertensive heart and chronic kidney disease with heart failure and with stage 5 chronic kidney disease, or end stage renal disease: Secondary | ICD-10-CM | POA: Diagnosis not present

## 2022-12-03 DIAGNOSIS — D631 Anemia in chronic kidney disease: Secondary | ICD-10-CM | POA: Diagnosis not present

## 2022-12-03 DIAGNOSIS — E785 Hyperlipidemia, unspecified: Secondary | ICD-10-CM | POA: Diagnosis not present

## 2022-12-03 DIAGNOSIS — Z9181 History of falling: Secondary | ICD-10-CM | POA: Diagnosis not present

## 2022-12-03 DIAGNOSIS — Z79899 Other long term (current) drug therapy: Secondary | ICD-10-CM | POA: Diagnosis not present

## 2022-12-04 DIAGNOSIS — D631 Anemia in chronic kidney disease: Secondary | ICD-10-CM | POA: Diagnosis not present

## 2022-12-04 DIAGNOSIS — N390 Urinary tract infection, site not specified: Secondary | ICD-10-CM | POA: Diagnosis not present

## 2022-12-04 DIAGNOSIS — N185 Chronic kidney disease, stage 5: Secondary | ICD-10-CM | POA: Diagnosis not present

## 2022-12-04 DIAGNOSIS — E162 Hypoglycemia, unspecified: Secondary | ICD-10-CM | POA: Diagnosis not present

## 2022-12-04 DIAGNOSIS — I132 Hypertensive heart and chronic kidney disease with heart failure and with stage 5 chronic kidney disease, or end stage renal disease: Secondary | ICD-10-CM | POA: Diagnosis not present

## 2022-12-04 DIAGNOSIS — E785 Hyperlipidemia, unspecified: Secondary | ICD-10-CM | POA: Diagnosis not present

## 2022-12-04 DIAGNOSIS — Z9181 History of falling: Secondary | ICD-10-CM | POA: Diagnosis not present

## 2022-12-04 DIAGNOSIS — F411 Generalized anxiety disorder: Secondary | ICD-10-CM | POA: Diagnosis not present

## 2022-12-04 DIAGNOSIS — I5033 Acute on chronic diastolic (congestive) heart failure: Secondary | ICD-10-CM | POA: Diagnosis not present

## 2022-12-04 DIAGNOSIS — Z79899 Other long term (current) drug therapy: Secondary | ICD-10-CM | POA: Diagnosis not present

## 2022-12-04 DIAGNOSIS — R946 Abnormal results of thyroid function studies: Secondary | ICD-10-CM | POA: Diagnosis not present

## 2022-12-05 ENCOUNTER — Other Ambulatory Visit: Payer: Self-pay

## 2022-12-05 ENCOUNTER — Telehealth (INDEPENDENT_AMBULATORY_CARE_PROVIDER_SITE_OTHER): Payer: Medicare (Managed Care)

## 2022-12-05 DIAGNOSIS — R300 Dysuria: Secondary | ICD-10-CM | POA: Diagnosis not present

## 2022-12-05 LAB — POCT URINALYSIS DIPSTICK
Bilirubin, UA: NEGATIVE
Glucose, UA: NEGATIVE
Ketones, UA: NEGATIVE
Nitrite, UA: NEGATIVE
Protein, UA: POSITIVE — AB
Spec Grav, UA: 1.01 (ref 1.010–1.025)
Urobilinogen, UA: 0.2 E.U./dL
pH, UA: 8 (ref 5.0–8.0)

## 2022-12-05 NOTE — Telephone Encounter (Signed)
Kara Mullins approached me stating she has a urine sample in the lab on [address] with no orders. Urine sample was dropped off right after lunch.  I called patients daughter to find out who instructed the patient to collect a urine sample and she replied no one. Kara Mullins took the initiative herself to have someone collect a urine sample and drop it off without consulting with [place]. I strongly advise that in the future someone consults with [place] first.   Kara Mullins states her mother has cloudy urine and mucous in urine x 3 days. No other symptoms.  When Kara Mullins was asked if patient is drinking an adequate amount of fluids Kara Mullins responded that her mother is on 1200 mL fluid restrictions.  I also asked if urine sample was a clean catch and Kara Mullins responded, yes.   I will do a manual urine dip and send this message to [facility] for a directive on what other actions to take.

## 2022-12-07 LAB — URINE CULTURE
MICRO NUMBER:: 14602915
SPECIMEN QUALITY:: ADEQUATE

## 2022-12-10 ENCOUNTER — Telehealth (INDEPENDENT_AMBULATORY_CARE_PROVIDER_SITE_OTHER): Payer: Medicare (Managed Care) | Admitting: Family

## 2022-12-10 ENCOUNTER — Encounter: Payer: Self-pay | Admitting: Family

## 2022-12-10 DIAGNOSIS — N3001 Acute cystitis with hematuria: Secondary | ICD-10-CM | POA: Diagnosis not present

## 2022-12-10 DIAGNOSIS — E43 Unspecified severe protein-calorie malnutrition: Secondary | ICD-10-CM | POA: Diagnosis not present

## 2022-12-10 MED ORDER — NITROFURANTOIN MONOHYD MACRO 100 MG PO CAPS: 100.0000 mg | ORAL_CAPSULE | Freq: Two times a day (BID) | ORAL | 0 refills | Status: DC

## 2022-12-10 NOTE — Progress Notes (Addendum)
Provider: [name] FNP-C  [place] Arabie, [name], NP  Patient Care Team: Tomer Chalmers, [name], NP as PCP - General (Family Medicine) Riley Churches, RN as [facility] Care Management  Extended Emergency Contact Information Primary Emergency Contact: [facility] Mobile Phone: [phone] Relation: Daughter Secondary Emergency Contact: Chamberlin,Curtis Mobile Phone: [phone] Relation: Son  Code Status:  Full Code  Goals of care: Advanced Directive information    10/28/2022    9:44 AM  Advanced Directives  Does Patient Have a Medical Advance Directive? Yes  Type of Estate agent of [place];Living will;Out of facility DNR (pink MOST or yellow form)  Does patient want to make changes to medical advance directive? No - Patient declined  Copy of Healthcare Power of Attorney in Chart? Yes - validated most recent copy scanned in chart (See row information)     Chief Complaint  Patient presents with   Acute Visit    Patient presents today for urine culture review.    HPI:  Pt is a 72 y.o. female seen today for an acute visit for evaluation of urine culture results.she is seen on video with assistance from her daughter. States had a urine specimen that was done 12/05/2022 which showed > 100,000 colonies of Citrobacter Freundii resistant to Cipro and Augmentin.sensitive to Nitrofurantoin.U/A indicated yellow cloudy urine positive for moderate blood, positive for protein and leukocytes but negative for nitrites.  she denies any fever,chills,nausea,vomiting,abdominal pain,flank pain,urgency,frequency,dysuria,difficult urination or hematuria.  Addendum:  She requires a gel overlay mattress to use on Hospital bed due to limited mobility and impaired nutritional status to prevent skin breakdown.   Past Medical History:  Diagnosis Date   Chronic combined systolic and diastolic congestive heart failure (HCC) 11/09/2021   Generalized anxiety disorder     Hyperlipidemia 11/09/2021   Hypertension    Stage 3b chronic kidney disease (CKD) (HCC) 11/09/2021   Past Surgical History:  Procedure Laterality Date   CATARACT EXTRACTION, BILATERAL     combined systolic and diastolic congestive heart failure       Allergies  Allergen Reactions   Crestor [Rosuvastatin] Other (See Comments)    "Felt like she couldn't swallow"    Outpatient Encounter Medications as of 12/10/2022  Medication Sig   carvedilol (COREG) 3.125 MG tablet Take 3.125 mg by mouth 2 (two) times daily with a meal.   cholecalciferol (VITAMIN D3) 25 MCG (1000 UNIT) tablet Take 1,000 Units by mouth daily.   Epoetin Alfa-epbx (RETACRIT IJ) Inject as directed every 30 (thirty) days.   feeding supplement (ENSURE ENLIVE / ENSURE PLUS) LIQD Take 237 mLs by mouth 2 (two) times daily between meals.   hydrALAZINE (APRESOLINE) 50 MG tablet TAKE 1 TABLET(50 MG) BY MOUTH EVERY 8 HOURS   Multiple Vitamin (MULTIVITAMIN ADULT PO) Take 1 tablet by mouth daily.   nitrofurantoin, macrocrystal-monohydrate, (MACROBID) 100 MG capsule Take 1 capsule (100 mg total) by mouth 2 (two) times daily for 7 days.   sevelamer carbonate (RENVELA) 800 MG tablet Take 800 mg by mouth every evening.   No facility-administered encounter medications on file as of 12/10/2022.    Review of Systems  Constitutional:  Negative for appetite change, chills, fatigue, fever and unexpected weight change.  Gastrointestinal:  Negative for abdominal distention, abdominal pain, constipation, diarrhea, nausea and vomiting.  Genitourinary:  Negative for difficulty urinating, dysuria, flank pain, frequency and urgency.       Foley catheter   Musculoskeletal:  Positive for gait problem. Negative for arthralgias, back pain,  joint swelling, myalgias, neck pain and neck stiffness.  Skin:  Negative for color change, pallor and rash.    Immunization History  Administered Date(s) Administered   PFIZER Comirnaty(Gray Top)Covid-19  Tri-Sucrose Vaccine 04/28/2021   PFIZER(Purple Top)SARS-COV-2 Vaccination 12/16/2019, 01/12/2020   Unspecified SARS-COV-2 Vaccination 08/21/2021   Zoster Recombinat (Shingrix) 07/09/2021, 08/29/2021   Pertinent  Health Maintenance Due  Topic Date Due   COLONOSCOPY (Pts 45-75yrs Insurance coverage will need to be confirmed)  Never done   MAMMOGRAM  Never done   DEXA SCAN  Never done   INFLUENZA VACCINE  Never done      08/17/2022    8:00 PM 08/18/2022   11:15 AM 10/04/2022   11:22 AM 10/04/2022   11:50 AM 10/28/2022    9:43 AM  Fall Risk  Falls in the past year?     0  Was there an injury with Fall?     0  Fall Risk Category Calculator     0  Fall Risk Category (Retired)     Low  (RETIRED) Patient Fall Risk Level Moderate fall risk Moderate fall risk High fall risk High fall risk   Patient at Risk for Falls Due to     No Fall Risks  Fall risk Follow up     Falls evaluation completed;Education provided;Falls prevention discussed   Functional Status Survey:    There were no vitals filed for this visit. There is no height or weight on file to calculate BMI. Physical Exam Constitutional:      General: She is not in acute distress.    Appearance: She is not ill-appearing.  Pulmonary:     Effort: Pulmonary effort is normal. No respiratory distress.  Neurological:     Mental Status: She is alert. Mental status is at baseline.  Psychiatric:        Mood and Affect: Mood normal.        Behavior: Behavior normal.     Labs reviewed: Recent Labs    06/17/22 0231 08/14/22 1930 08/14/22 2133 08/15/22 0330 08/16/22 0444 08/17/22 0055 08/18/22 0046 08/23/22 0000 10/04/22 1249 10/29/22 0000  NA 142   < >  --  143 142 138 137 143 139 139  K 3.7   < >  --  3.9 4.0 3.8 3.8 3.9 3.6 3.9  CL 110   < >  --  115* 110 113* 108 111* 105 115*  CO2 23   < >  --  19* 18* 18* 17* 16 19* 15  GLUCOSE 82   < >  --  78 68* 98 94  --  138*  --   BUN 91*   < >  --  107* 110* 106* 106* 106*  126* 92*  CREATININE 4.93*   < >  --  5.20* 5.23* 5.39* 5.12* 5.5* 6.53* 4.3*  CALCIUM 7.9*   < >  --  7.4* 7.4* 7.1* 7.2* 7.4* 7.3* 7.4*  MG 2.4  --  2.5* 2.5*  --   --   --   --   --   --   PHOS 8.4*  --   --  8.5* 8.6* 8.7* 8.7*  --   --   --    < > = values in this interval not displayed.   Recent Labs    08/14/22 1930 08/15/22 0330 08/16/22 0444 08/23/22 0000 10/04/22 1249 10/29/22 0000  AST 35 27  --   --  29  --   ALT 27  21  --   --  21  --   ALKPHOS 114 101  --   --  91  --   BILITOT 0.7 0.3  --   --  0.3  --   PROT 6.3* 5.3*  --   --  6.2*  --   ALBUMIN 2.1* 1.7*   < > 2.5* 1.9* 2.7*   < > = values in this interval not displayed.   Recent Labs    08/14/22 1930 08/15/22 0330 08/15/22 1819 08/16/22 0444 08/23/22 0000 10/04/22 1249 10/29/22 0000 11/28/22 0935 11/28/22 0937  WBC 4.6 4.5 4.9 4.3  --  6.1  --   --   --   NEUTROABS 2.9 2.8  --   --   --  4.2  --   --   --   HGB 9.0* 7.2* 7.8* 7.5*   < > 8.1* 7.5* 6.6* 6.5*  HCT 27.7* 23.6* 24.7* 22.6*  --  26.0*  --   --   --   MCV 92.3 95.2 94.3 91.1  --  97.4  --   --   --   PLT 160 150 148* 139*  --  236  --   --   --    < > = values in this interval not displayed.   Lab Results  Component Value Date   TSH 6.306 (H) 08/14/2022   No results found for: "HGBA1C" Lab Results  Component Value Date   CHOL 212 (H) 08/27/2021   HDL 67 08/27/2021   LDLCALC 128 (H) 08/27/2021   TRIG 78 08/27/2021   CHOLHDL 3.2 08/27/2021    Significant Diagnostic Results in last 30 days:  No results found.  Assessment/Plan 1. Acute cystitis with hematuria Afebrile  Final urine culture done 12/05/2022 showed > 100,000 colonies of Citrobacter Freundii resistant to Cipro and Augmentin.sensitive to Nitrofurantoin.U/A indicated yellow cloudy urine positive for moderate blood, positive for protein and leukocytes but negative for nitrites.  - start on Nitrofurantoin as below.  - nitrofurantoin, macrocrystal-monohydrate, (MACROBID)  100 MG capsule; Take 1 capsule (100 mg total) by mouth 2 (two) times daily for 7 days.  Dispense: 14 capsule; Refill: 0  2. Unspecified severe protein-calorie malnutrition (HCC) - continue to encourage oral intake and protein supplement. - She requires a gel overlay mattress to use on Hospital bed due to limited mobility and impaired nutritional status to prevent skin breakdown.  Family/ staff Communication: Reviewed plan of care with patient and daughter verbalized understanding.   Labs/tests ordered: None   Next Appointment: Return if symptoms worsen or fail to improve.   I connected with  [name] on 12/10/22 by a video enabled telemedicine application and verified that I am speaking with the correct person using two identifiers.   I discussed the limitations of evaluation and management by telemedicine. The patient expressed understanding and agreed to proceed.  Spent 10 minutes of face to face with patient  >50% time spent counseling; reviewing medical record; labs; and developing future plan of care.   Caesar Bookman, NP

## 2022-12-11 DIAGNOSIS — F411 Generalized anxiety disorder: Secondary | ICD-10-CM | POA: Diagnosis not present

## 2022-12-11 DIAGNOSIS — N185 Chronic kidney disease, stage 5: Secondary | ICD-10-CM | POA: Diagnosis not present

## 2022-12-11 DIAGNOSIS — R946 Abnormal results of thyroid function studies: Secondary | ICD-10-CM | POA: Diagnosis not present

## 2022-12-11 DIAGNOSIS — E785 Hyperlipidemia, unspecified: Secondary | ICD-10-CM | POA: Diagnosis not present

## 2022-12-11 DIAGNOSIS — I132 Hypertensive heart and chronic kidney disease with heart failure and with stage 5 chronic kidney disease, or end stage renal disease: Secondary | ICD-10-CM | POA: Diagnosis not present

## 2022-12-11 DIAGNOSIS — N390 Urinary tract infection, site not specified: Secondary | ICD-10-CM | POA: Diagnosis not present

## 2022-12-11 DIAGNOSIS — D631 Anemia in chronic kidney disease: Secondary | ICD-10-CM | POA: Diagnosis not present

## 2022-12-11 DIAGNOSIS — Z79899 Other long term (current) drug therapy: Secondary | ICD-10-CM | POA: Diagnosis not present

## 2022-12-11 DIAGNOSIS — E162 Hypoglycemia, unspecified: Secondary | ICD-10-CM | POA: Diagnosis not present

## 2022-12-11 DIAGNOSIS — Z9181 History of falling: Secondary | ICD-10-CM | POA: Diagnosis not present

## 2022-12-11 DIAGNOSIS — I5033 Acute on chronic diastolic (congestive) heart failure: Secondary | ICD-10-CM | POA: Diagnosis not present

## 2022-12-12 ENCOUNTER — Encounter (HOSPITAL_COMMUNITY)
Admission: RE | Admit: 2022-12-12 | Discharge: 2022-12-12 | Disposition: A | Discharge status: Home / Self Care | Payer: Medicare (Managed Care) | Source: Ambulatory Visit | Attending: Nephrology | Admitting: Nephrology

## 2022-12-12 ENCOUNTER — Telehealth: Payer: Self-pay

## 2022-12-12 VITALS — BP 105/69 | HR 63 | Temp 97.9°F | Resp 17

## 2022-12-12 DIAGNOSIS — N185 Chronic kidney disease, stage 5: Secondary | ICD-10-CM

## 2022-12-12 MED ORDER — EPOETIN ALFA-EPBX 10000 UNIT/ML IJ SOLN: 20000.0000 [IU] | INTRAMUSCULAR | Status: DC

## 2022-12-12 MED ORDER — EPOETIN ALFA-EPBX 10000 UNIT/ML IJ SOLN
INTRAMUSCULAR | Status: AC
  Administered 2022-12-12: 20000 [IU] via SUBCUTANEOUS
  Filled 2022-12-12: qty 2

## 2022-12-12 NOTE — Progress Notes (Signed)
Pt's hemoCue is 6.6.  Repeated is 6.3. Sharese at  [employer].

## 2022-12-12 NOTE — Telephone Encounter (Signed)
(  2:47 pm) PC SW returned a call to patient's daughter-Terra. SW left a voice message requesting a call back.

## 2022-12-12 NOTE — Progress Notes (Signed)
Pt given retacrit injection and instructed family to take her to the ED if she felt symptomatic or any worse from her UTI or they did not feel like she was improving from her Antibiotics she started yesterday. Family verbalizes understanding.  Pt does not want to go to the ED at this time.

## 2022-12-13 LAB — POCT HEMOGLOBIN-HEMACUE: Hemoglobin: 6.3 g/dL — CL (ref 12.0–15.0)

## 2022-12-14 ENCOUNTER — Telehealth: Payer: Self-pay | Admitting: Nurse Practitioner

## 2022-12-14 ENCOUNTER — Other Ambulatory Visit: Payer: Self-pay | Admitting: Nurse Practitioner

## 2022-12-14 NOTE — Telephone Encounter (Signed)
While on call daughter called and reports she can not take nitrofurantoin- nausea with vomiting every dose she has taken.  No fever or other symptoms.  Urine was mucousy but now urine clear.  Her mentation is stable and no changes in conditions. Vitals stable Will have her stop medications at this time Sample was obtain from the catheter that was already in place.  Due to her ESRD and susceptibles only wit IV antibiotic will have her continue to monitor patient.  If she starts having symptoms will then have catheter replaced and obtain another culture.  Also advised to go to the hospital if significant change in condition occurs, fever, etc Daughter agreeable to plan.

## 2022-12-16 DIAGNOSIS — E162 Hypoglycemia, unspecified: Secondary | ICD-10-CM | POA: Diagnosis not present

## 2022-12-16 DIAGNOSIS — N185 Chronic kidney disease, stage 5: Secondary | ICD-10-CM | POA: Diagnosis not present

## 2022-12-16 DIAGNOSIS — I5033 Acute on chronic diastolic (congestive) heart failure: Secondary | ICD-10-CM | POA: Diagnosis not present

## 2022-12-16 DIAGNOSIS — F411 Generalized anxiety disorder: Secondary | ICD-10-CM | POA: Diagnosis not present

## 2022-12-16 DIAGNOSIS — E785 Hyperlipidemia, unspecified: Secondary | ICD-10-CM | POA: Diagnosis not present

## 2022-12-16 DIAGNOSIS — R946 Abnormal results of thyroid function studies: Secondary | ICD-10-CM | POA: Diagnosis not present

## 2022-12-16 DIAGNOSIS — D631 Anemia in chronic kidney disease: Secondary | ICD-10-CM | POA: Diagnosis not present

## 2022-12-16 DIAGNOSIS — Z79899 Other long term (current) drug therapy: Secondary | ICD-10-CM | POA: Diagnosis not present

## 2022-12-16 DIAGNOSIS — Z9181 History of falling: Secondary | ICD-10-CM | POA: Diagnosis not present

## 2022-12-16 DIAGNOSIS — I132 Hypertensive heart and chronic kidney disease with heart failure and with stage 5 chronic kidney disease, or end stage renal disease: Secondary | ICD-10-CM | POA: Diagnosis not present

## 2022-12-16 DIAGNOSIS — N390 Urinary tract infection, site not specified: Secondary | ICD-10-CM | POA: Diagnosis not present

## 2022-12-17 DIAGNOSIS — A419 Sepsis, unspecified organism: Secondary | ICD-10-CM | POA: Diagnosis not present

## 2022-12-17 DIAGNOSIS — N185 Chronic kidney disease, stage 5: Secondary | ICD-10-CM | POA: Diagnosis not present

## 2022-12-17 DIAGNOSIS — I5042 Chronic combined systolic (congestive) and diastolic (congestive) heart failure: Secondary | ICD-10-CM | POA: Diagnosis not present

## 2022-12-17 DIAGNOSIS — U071 COVID-19: Secondary | ICD-10-CM | POA: Diagnosis not present

## 2022-12-17 DIAGNOSIS — R531 Weakness: Secondary | ICD-10-CM | POA: Diagnosis not present

## 2022-12-17 DIAGNOSIS — N1832 Chronic kidney disease, stage 3b: Secondary | ICD-10-CM | POA: Diagnosis not present

## 2022-12-19 ENCOUNTER — Telehealth: Payer: Self-pay

## 2022-12-19 NOTE — Telephone Encounter (Signed)
Kara Mullins with [facility] called requesting verbal orders for skilled nursing 1 time a week for 9 weeks and 2 prn visits.  Verbal orders were given.

## 2022-12-20 ENCOUNTER — Ambulatory Visit: Payer: Self-pay

## 2022-12-20 NOTE — Patient Outreach (Addendum)
  Care Coordination   Follow Up Visit Note   12/20/2022 Name: Kara Mullins MRN: [id number] DOB: Mar 24, 1951  Kara Mullins is a 72 y.o. year old female who sees [name], [name], NP for primary care. I spoke with daughter Kara Mullins by phone today.  What matters to the patients health and wellness today?  Patient's daughter will discuss dialysis options for her mother with patient's established Nephrologist at next scheduled appointment.     Goals Addressed             This Visit's Progress    I would like to keep my mom out of the hospital       Care Coordination Interventions: Assessed the POA Kara Mullins  understanding of chronic kidney disease    Evaluation of current treatment plan related to chronic kidney disease self management and patient's adherence to plan as established by provider      Advised patient to discuss options for hemodialysis and or peritoneal dialysis with provider    Discussed the impact of chronic kidney disease on daily life and mental health and acknowledged and normalized feelings of disempowerment, fear, and frustration    Provided education on kidney disease progression    Engage patient in early, proactive and ongoing discussion about goals of care and what matters most to them    Printed mailed printed educational materials related to hemodialysis/peritoneal dialysis          Interventions Today    Flowsheet Row Most Recent Value  Chronic Disease   Chronic disease during today's visit Chronic Kidney Disease/End Stage Renal Disease (ESRD)  General Interventions   General Interventions Discussed/Reviewed General Interventions Discussed, General Interventions Reviewed, Labs, Doctor Visits  Labs Kidney Function  Doctor Visits Discussed/Reviewed Doctor Visits Discussed, Doctor Visits Reviewed, Specialist  Education Interventions   Education Provided Provided Printed Education, Provided Education  Provided Verbal Education On Other   [Hemodialysis,  Peritoneal dialysis]  Nutrition Interventions   Nutrition Discussed/Reviewed Fluid intake          SDOH assessments and interventions completed:  No     Care Coordination Interventions:  Yes, provided   Follow up plan: Follow up call scheduled for 01/17/23 @1 :00 PM    Encounter Outcome:  Pt. Visit Completed

## 2022-12-20 NOTE — Patient Instructions (Signed)
Visit Information  Thank you for taking time to visit with me today. Please don't hesitate to contact me if I can be of assistance to you.   Following are the goals we discussed today:   Goals Addressed             This Visit's Progress    I would like to keep my mom out of the hospital       Care Coordination Interventions: Assessed the POA Kara Mullins  understanding of chronic kidney disease    Evaluation of current treatment plan related to chronic kidney disease self management and patient's adherence to plan as established by provider      Advised patient to discuss options for hemodialysis and or peritoneal dialysis with provider    Discussed the impact of chronic kidney disease on daily life and mental health and acknowledged and normalized feelings of disempowerment, fear, and frustration    Provided education on kidney disease progression    Engage patient in early, proactive and ongoing discussion about goals of care and what matters most to them    Printed mailed printed educational materials related to hemodialysis/peritoneal dialysis              Our next appointment is by telephone on 01/17/23 at 1:00 PM  Please call the care guide team at [phone] if you need to cancel or reschedule your appointment.   If you are experiencing a Mental Health or [place] or need someone to talk to, please call 1-800-273-TALK (toll free, 24 hour hotline) go to [facility] [address] [place] [phone])  Patient verbalizes understanding of instructions and care plan provided today and agrees to view in [place]. Active MyChart status and patient understanding of how to access instructions and care plan via MyChart confirmed with patient.     Barb Merino, RN, BSN, CCM Care Management Coordinator [facility] Care Management Direct Phone: [phone]

## 2022-12-25 DIAGNOSIS — Z466 Encounter for fitting and adjustment of urinary device: Secondary | ICD-10-CM | POA: Diagnosis not present

## 2022-12-25 DIAGNOSIS — F411 Generalized anxiety disorder: Secondary | ICD-10-CM | POA: Diagnosis not present

## 2022-12-25 DIAGNOSIS — E162 Hypoglycemia, unspecified: Secondary | ICD-10-CM | POA: Diagnosis not present

## 2022-12-25 DIAGNOSIS — Z79899 Other long term (current) drug therapy: Secondary | ICD-10-CM | POA: Diagnosis not present

## 2022-12-25 DIAGNOSIS — Z993 Dependence on wheelchair: Secondary | ICD-10-CM | POA: Diagnosis not present

## 2022-12-25 DIAGNOSIS — N390 Urinary tract infection, site not specified: Secondary | ICD-10-CM | POA: Diagnosis not present

## 2022-12-25 DIAGNOSIS — D631 Anemia in chronic kidney disease: Secondary | ICD-10-CM | POA: Diagnosis not present

## 2022-12-25 DIAGNOSIS — N186 End stage renal disease: Secondary | ICD-10-CM | POA: Diagnosis not present

## 2022-12-25 DIAGNOSIS — Z9181 History of falling: Secondary | ICD-10-CM | POA: Diagnosis not present

## 2022-12-25 DIAGNOSIS — I132 Hypertensive heart and chronic kidney disease with heart failure and with stage 5 chronic kidney disease, or end stage renal disease: Secondary | ICD-10-CM | POA: Diagnosis not present

## 2022-12-25 DIAGNOSIS — E785 Hyperlipidemia, unspecified: Secondary | ICD-10-CM | POA: Diagnosis not present

## 2022-12-25 DIAGNOSIS — R946 Abnormal results of thyroid function studies: Secondary | ICD-10-CM | POA: Diagnosis not present

## 2022-12-25 DIAGNOSIS — M1990 Unspecified osteoarthritis, unspecified site: Secondary | ICD-10-CM | POA: Diagnosis not present

## 2022-12-26 ENCOUNTER — Encounter (HOSPITAL_COMMUNITY)
Admission: RE | Admit: 2022-12-26 | Discharge: 2022-12-26 | Disposition: A | Discharge status: Home / Self Care | Payer: Medicare (Managed Care) | Source: Ambulatory Visit | Attending: Nephrology | Admitting: Nephrology

## 2022-12-26 VITALS — BP 147/80 | HR 61 | Temp 97.5°F | Resp 17

## 2022-12-26 DIAGNOSIS — N185 Chronic kidney disease, stage 5: Secondary | ICD-10-CM | POA: Diagnosis not present

## 2022-12-26 LAB — POCT HEMOGLOBIN-HEMACUE: Hemoglobin: 6.3 g/dL — CL (ref 12.0–15.0)

## 2022-12-26 LAB — IRON AND TIBC
Iron: 23 ug/dL — ABNORMAL LOW (ref 28–170)
Saturation Ratios: 17 % (ref 10.4–31.8)
TIBC: 134 ug/dL — ABNORMAL LOW (ref 250–450)
UIBC: 111 ug/dL

## 2022-12-26 LAB — FERRITIN: Ferritin: 797 ng/mL — ABNORMAL HIGH (ref 11–307)

## 2022-12-26 MED ORDER — EPOETIN ALFA-EPBX 10000 UNIT/ML IJ SOLN
20000.0000 [IU] | INTRAMUSCULAR | Status: DC
  Administered 2022-12-26: 20000 [IU] via SUBCUTANEOUS

## 2022-12-26 MED ORDER — EPOETIN ALFA-EPBX 10000 UNIT/ML IJ SOLN
INTRAMUSCULAR | Status: AC
  Filled 2022-12-26: qty 2

## 2022-12-26 NOTE — Progress Notes (Signed)
Pt here for retacrit injection.  HGB 6.3 via hemocue. Pt states that she feels better than she has, no active bleeding noted, no chest pain or shortness of breath.  Amber at Dr Luis Abed office notified.  No new orders received.  Pt's daughter asked about the possibility of a blood transfusion.  I explained the process to her and the pt.  She will discuss with the doctor to see if a transfusion is an optin

## 2023-01-01 ENCOUNTER — Other Ambulatory Visit (HOSPITAL_COMMUNITY): Payer: Self-pay

## 2023-01-01 DIAGNOSIS — N185 Chronic kidney disease, stage 5: Secondary | ICD-10-CM

## 2023-01-02 ENCOUNTER — Encounter (HOSPITAL_COMMUNITY)
Admission: RE | Admit: 2023-01-02 | Discharge: 2023-01-02 | Disposition: A | Discharge status: Home / Self Care | Payer: Medicare (Managed Care) | Source: Ambulatory Visit | Attending: Nephrology | Admitting: Nephrology

## 2023-01-02 DIAGNOSIS — N185 Chronic kidney disease, stage 5: Secondary | ICD-10-CM

## 2023-01-02 LAB — PREPARE RBC (CROSSMATCH)

## 2023-01-02 MED ORDER — SODIUM CHLORIDE 0.9% IV SOLUTION: Freq: Once | INTRAVENOUS | Status: DC

## 2023-01-03 LAB — BPAM RBC
Blood Product Expiration Date: 202404112359
Blood Product Expiration Date: 202404112359
ISSUE DATE / TIME: 202403210939
ISSUE DATE / TIME: 202403211302
Unit Type and Rh: 6200
Unit Type and Rh: 6200

## 2023-01-03 LAB — TYPE AND SCREEN
ABO/RH(D): AB POS
Antibody Screen: NEGATIVE
Unit division: 0
Unit division: 0

## 2023-01-06 DIAGNOSIS — Z993 Dependence on wheelchair: Secondary | ICD-10-CM | POA: Diagnosis not present

## 2023-01-06 DIAGNOSIS — Z466 Encounter for fitting and adjustment of urinary device: Secondary | ICD-10-CM | POA: Diagnosis not present

## 2023-01-06 DIAGNOSIS — I132 Hypertensive heart and chronic kidney disease with heart failure and with stage 5 chronic kidney disease, or end stage renal disease: Secondary | ICD-10-CM | POA: Diagnosis not present

## 2023-01-06 DIAGNOSIS — E785 Hyperlipidemia, unspecified: Secondary | ICD-10-CM | POA: Diagnosis not present

## 2023-01-06 DIAGNOSIS — Z9181 History of falling: Secondary | ICD-10-CM | POA: Diagnosis not present

## 2023-01-06 DIAGNOSIS — E162 Hypoglycemia, unspecified: Secondary | ICD-10-CM | POA: Diagnosis not present

## 2023-01-06 DIAGNOSIS — R946 Abnormal results of thyroid function studies: Secondary | ICD-10-CM | POA: Diagnosis not present

## 2023-01-06 DIAGNOSIS — D631 Anemia in chronic kidney disease: Secondary | ICD-10-CM | POA: Diagnosis not present

## 2023-01-06 DIAGNOSIS — N390 Urinary tract infection, site not specified: Secondary | ICD-10-CM | POA: Diagnosis not present

## 2023-01-06 DIAGNOSIS — Z79899 Other long term (current) drug therapy: Secondary | ICD-10-CM | POA: Diagnosis not present

## 2023-01-06 DIAGNOSIS — N186 End stage renal disease: Secondary | ICD-10-CM | POA: Diagnosis not present

## 2023-01-06 DIAGNOSIS — M1990 Unspecified osteoarthritis, unspecified site: Secondary | ICD-10-CM | POA: Diagnosis not present

## 2023-01-06 DIAGNOSIS — F411 Generalized anxiety disorder: Secondary | ICD-10-CM | POA: Diagnosis not present

## 2023-01-09 ENCOUNTER — Encounter (HOSPITAL_COMMUNITY)
Admission: RE | Admit: 2023-01-09 | Discharge: 2023-01-09 | Disposition: A | Discharge status: Home / Self Care | Payer: Medicare (Managed Care) | Source: Ambulatory Visit | Attending: Nephrology | Admitting: Nephrology

## 2023-01-09 VITALS — BP 156/89 | HR 62 | Temp 97.3°F | Resp 17

## 2023-01-09 DIAGNOSIS — N185 Chronic kidney disease, stage 5: Secondary | ICD-10-CM | POA: Diagnosis not present

## 2023-01-09 LAB — POCT HEMOGLOBIN-HEMACUE: Hemoglobin: 10.5 g/dL — ABNORMAL LOW (ref 12.0–15.0)

## 2023-01-09 MED ORDER — EPOETIN ALFA-EPBX 10000 UNIT/ML IJ SOLN
20000.0000 [IU] | INTRAMUSCULAR | Status: DC
  Administered 2023-01-09: 20000 [IU] via SUBCUTANEOUS

## 2023-01-09 MED ORDER — EPOETIN ALFA-EPBX 10000 UNIT/ML IJ SOLN
INTRAMUSCULAR | Status: AC
  Filled 2023-01-09: qty 1

## 2023-01-09 MED ORDER — EPOETIN ALFA-EPBX 10000 UNIT/ML IJ SOLN
INTRAMUSCULAR | Status: AC
  Filled 2023-01-09: qty 2

## 2023-01-13 DIAGNOSIS — M1990 Unspecified osteoarthritis, unspecified site: Secondary | ICD-10-CM | POA: Diagnosis not present

## 2023-01-13 DIAGNOSIS — R946 Abnormal results of thyroid function studies: Secondary | ICD-10-CM | POA: Diagnosis not present

## 2023-01-13 DIAGNOSIS — E162 Hypoglycemia, unspecified: Secondary | ICD-10-CM | POA: Diagnosis not present

## 2023-01-13 DIAGNOSIS — Z79899 Other long term (current) drug therapy: Secondary | ICD-10-CM | POA: Diagnosis not present

## 2023-01-13 DIAGNOSIS — D631 Anemia in chronic kidney disease: Secondary | ICD-10-CM | POA: Diagnosis not present

## 2023-01-13 DIAGNOSIS — I132 Hypertensive heart and chronic kidney disease with heart failure and with stage 5 chronic kidney disease, or end stage renal disease: Secondary | ICD-10-CM | POA: Diagnosis not present

## 2023-01-13 DIAGNOSIS — Z993 Dependence on wheelchair: Secondary | ICD-10-CM | POA: Diagnosis not present

## 2023-01-13 DIAGNOSIS — N390 Urinary tract infection, site not specified: Secondary | ICD-10-CM | POA: Diagnosis not present

## 2023-01-13 DIAGNOSIS — Z466 Encounter for fitting and adjustment of urinary device: Secondary | ICD-10-CM | POA: Diagnosis not present

## 2023-01-13 DIAGNOSIS — F411 Generalized anxiety disorder: Secondary | ICD-10-CM | POA: Diagnosis not present

## 2023-01-13 DIAGNOSIS — N186 End stage renal disease: Secondary | ICD-10-CM | POA: Diagnosis not present

## 2023-01-13 DIAGNOSIS — Z9181 History of falling: Secondary | ICD-10-CM | POA: Diagnosis not present

## 2023-01-13 DIAGNOSIS — E785 Hyperlipidemia, unspecified: Secondary | ICD-10-CM | POA: Diagnosis not present

## 2023-01-17 DIAGNOSIS — N1832 Chronic kidney disease, stage 3b: Secondary | ICD-10-CM | POA: Diagnosis not present

## 2023-01-17 DIAGNOSIS — U071 COVID-19: Secondary | ICD-10-CM | POA: Diagnosis not present

## 2023-01-17 DIAGNOSIS — N185 Chronic kidney disease, stage 5: Secondary | ICD-10-CM | POA: Diagnosis not present

## 2023-01-17 DIAGNOSIS — I5042 Chronic combined systolic (congestive) and diastolic (congestive) heart failure: Secondary | ICD-10-CM | POA: Diagnosis not present

## 2023-01-17 DIAGNOSIS — A419 Sepsis, unspecified organism: Secondary | ICD-10-CM | POA: Diagnosis not present

## 2023-01-17 DIAGNOSIS — R531 Weakness: Secondary | ICD-10-CM | POA: Diagnosis not present

## 2023-01-22 DIAGNOSIS — N185 Chronic kidney disease, stage 5: Secondary | ICD-10-CM | POA: Diagnosis not present

## 2023-01-22 LAB — BASIC METABOLIC PANEL WITH GFR
BUN: 60 — AB (ref 4–21)
CO2: 18 (ref 13–22)
Chloride: 111 — AB (ref 99–108)
Creatinine: 3 — AB (ref 0.5–1.1)
Glucose: 110
Potassium: 3.7 meq/L (ref 3.5–5.1)
Sodium: 142 (ref 137–147)

## 2023-01-22 LAB — COMPREHENSIVE METABOLIC PANEL
Albumin: 2 — AB (ref 3.5–5.0)
Calcium: 7.2 — AB (ref 8.7–10.7)

## 2023-01-22 LAB — CBC: RBC: 3.29 — AB (ref 3.87–5.11)

## 2023-01-22 LAB — CBC AND DIFFERENTIAL
HCT: 30 — AB (ref 36–46)
Hemoglobin: 9.5 — AB (ref 12.0–16.0)
Platelets: 184 K/uL (ref 150–400)
WBC: 5

## 2023-01-23 ENCOUNTER — Encounter (HOSPITAL_COMMUNITY): Payer: Medicare (Managed Care)

## 2023-01-24 ENCOUNTER — Ambulatory Visit: Payer: Self-pay

## 2023-01-24 NOTE — Patient Outreach (Signed)
  Care Coordination   01/24/2023 Name: Kara Mullins MRN: [id number] DOB: [birth year]   Care Coordination Outreach Attempts:  An unsuccessful telephone outreach was attempted for a scheduled appointment today.  Follow Up Plan:  Additional outreach attempts will be made to offer the patient care coordination information and services.   Encounter Outcome:  No Answer   Care Coordination Interventions:  No, not indicated    Delsa Sale, RN, BSN, CCM Care Management Coordinator [facility] Care Management  Direct Phone: [phone]'

## 2023-01-27 ENCOUNTER — Ambulatory Visit (INDEPENDENT_AMBULATORY_CARE_PROVIDER_SITE_OTHER): Payer: Medicare (Managed Care) | Admitting: Family

## 2023-01-27 ENCOUNTER — Encounter: Payer: Self-pay | Admitting: Family

## 2023-01-27 ENCOUNTER — Encounter (HOSPITAL_COMMUNITY)
Admission: RE | Admit: 2023-01-27 | Discharge: 2023-01-27 | Disposition: A | Discharge status: Home / Self Care | Payer: Medicare (Managed Care) | Source: Ambulatory Visit | Attending: Nephrology | Admitting: Nephrology

## 2023-01-27 VITALS — BP 146/94 | HR 66 | Temp 97.5°F | Resp 18

## 2023-01-27 DIAGNOSIS — N185 Chronic kidney disease, stage 5: Secondary | ICD-10-CM | POA: Insufficient documentation

## 2023-01-27 DIAGNOSIS — Z0000 Encounter for general adult medical examination without abnormal findings: Secondary | ICD-10-CM | POA: Diagnosis not present

## 2023-01-27 LAB — IRON AND TIBC
Iron: 22 ug/dL — ABNORMAL LOW (ref 28–170)
Saturation Ratios: 18 % (ref 10.4–31.8)
TIBC: 125 ug/dL — ABNORMAL LOW (ref 250–450)
UIBC: 103 ug/dL

## 2023-01-27 LAB — FERRITIN: Ferritin: 780 ng/mL — ABNORMAL HIGH (ref 11–307)

## 2023-01-27 LAB — POCT HEMOGLOBIN-HEMACUE: Hemoglobin: 10.7 g/dL — ABNORMAL LOW (ref 12.0–15.0)

## 2023-01-27 MED ORDER — EPOETIN ALFA-EPBX 10000 UNIT/ML IJ SOLN
INTRAMUSCULAR | Status: AC
  Filled 2023-01-27: qty 2

## 2023-01-27 MED ORDER — EPOETIN ALFA-EPBX 10000 UNIT/ML IJ SOLN
20000.0000 [IU] | INTRAMUSCULAR | Status: DC
  Administered 2023-01-27: 20000 [IU] via SUBCUTANEOUS

## 2023-01-27 NOTE — Progress Notes (Signed)
Subjective:   Kara Mullins is a 72 y.o. female who presents for Medicare Annual (Subsequent) preventive examination.  Review of Systems    Cardiac Risk Factors include: advanced age (>39men, >71 women);hypertension;sedentary lifestyle     Objective:    Today's Vitals   01/27/23 1448  PainSc: 6    There is no height or weight on file to calculate BMI.     01/27/2023    2:04 PM 10/28/2022    9:44 AM 08/14/2022    7:19 PM 06/24/2022   10:58 AM 06/02/2022    3:04 AM 04/24/2022    7:15 PM 04/17/2022    3:00 PM  Advanced Directives  Does Patient Have a Medical Advance Directive? Yes Yes Yes Yes No;Yes Yes Yes  Type of Estate agent of [place];Living will Healthcare Power of [place];Living will;Out of facility DNR (pink MOST or yellow form)  Out of facility DNR (pink MOST or yellow form) Out of facility DNR (pink MOST or yellow form) Out of facility DNR (pink MOST or yellow form) Out of facility DNR (pink MOST or yellow form)  Does patient want to make changes to medical advance directive? No - Patient declined No - Patient declined  No - Patient declined   No - Patient declined  Copy of Healthcare Power of Attorney in Chart? Yes - validated most recent copy scanned in chart (See row information) Yes - validated most recent copy scanned in chart (See row information)       Would patient like information on creating a medical advance directive?      No - Patient declined   Pre-existing out of facility DNR order (yellow form or pink MOST form)      Pink MOST form placed in chart (order not valid for inpatient use) Pink MOST form placed in chart (order not valid for inpatient use)    Current Medications (verified) Outpatient Encounter Medications as of 01/27/2023  Medication Sig   carvedilol (COREG) 3.125 MG tablet Take 3.125 mg by mouth 2 (two) times daily with a meal.   cholecalciferol (VITAMIN D3) 25 MCG (1000 UNIT) tablet Take 1,000 Units by mouth daily.   Epoetin  Alfa-epbx (RETACRIT IJ) Inject as directed every 30 (thirty) days.   feeding supplement (ENSURE ENLIVE / ENSURE PLUS) LIQD Take 237 mLs by mouth 2 (two) times daily between meals.   hydrALAZINE (APRESOLINE) 50 MG tablet TAKE 1 TABLET(50 MG) BY MOUTH EVERY 8 HOURS   Multiple Vitamin (MULTIVITAMIN ADULT PO) Take 1 tablet by mouth daily.   sevelamer carbonate (RENVELA) 800 MG tablet Take 800 mg by mouth every evening.   Facility-Administered Encounter Medications as of 01/27/2023  Medication   epoetin alfa-epbx (RETACRIT) 96789 UNIT/ML injection   epoetin alfa-epbx (RETACRIT) injection 20,000 Units    Allergies (verified) Crestor [rosuvastatin] and Nitrofuran derivatives   History: Past Medical History:  Diagnosis Date   Chronic combined systolic and diastolic congestive heart failure 11/09/2021   Generalized anxiety disorder    Hyperlipidemia 11/09/2021   Hypertension    Stage 3b chronic kidney disease (CKD) 11/09/2021   Past Surgical History:  Procedure Laterality Date   CATARACT EXTRACTION, BILATERAL     combined systolic and diastolic congestive heart failure      Family History  Problem Relation Age of Onset   Throat cancer Father    Social History   Socioeconomic History   Marital status: Divorced    Spouse name: Not on file   Number of children: 1  Years of education: Not on file   Highest education level: Not on file  Occupational History   Occupation: retired  Tobacco Use   Smoking status: Never   Smokeless tobacco: Never  Vaping Use   Vaping Use: Never used  Substance and Sexual Activity   Alcohol use: Not Currently   Drug use: Never   Sexual activity: Not Currently  Other Topics Concern   Not on file  Social History Narrative   ** Merged History Encounter **       Tobacco use, amount per day now: N/A Past tobacco use, amount per day: N/A How many years did you use tobacco: N/A Alcohol use (drinks per week): N/A Diet: Do you drink/eat things with  caffeine: Coffee Marital status: Divorced                                    What year were you married? 1992 Do you live in a house, apartment, assisted living, condo, trailer, etc.? Is it one or more stories? How many persons live in your home? 2 Do you have pets in your home?( please list) No Highest Level of e   ducation completed? High School Current or past profession: Restaurant Employee Do you exercise?   Yes                               Type and how often? Walk Do you have a living will? No Do you have a DNR form?    No                               If    not, do you want to discuss one? Do you have signed POA/HPOA forms? No                       If so, please bring to you appointment  Do you have any difficulty bathing or dressing yourself? No Do you have any difficulty preparing food or eating? No    Do you have any difficulty managing your medications? No Do you have any difficulty managing your finances? No Do you have any difficulty affording your medications? No   Social Determinants of Health   Financial Resource Strain: Low Risk  (06/11/2022)   Overall Financial Resource Strain (CARDIA)    Difficulty of Paying Living Expenses: Not very hard  Food Insecurity: No Food Insecurity (06/19/2022)   Hunger Vital Sign    Worried About Running Out of Food in the Last Year: Never true    Ran Out of Food in the Last Year: Never true  Transportation Needs: No Transportation Needs (11/22/2022)   PRAPARE - Administrator, Civil Service (Medical): No    Lack of Transportation (Non-Medical): No  Physical Activity: Not on file  Stress: Not on file  Social Connections: Not on file    Tobacco Counseling Counseling given: Not Answered   Clinical Intake:  Pre-visit preparation completed: No  Pain : 0-10 Pain Score: 6  Pain Type: Chronic pain Pain Location: Leg Pain Orientation: Left Pain Radiating Towards: No Pain Descriptors / Indicators: Aching Pain  Frequency: Constant Pain Relieving Factors: topical analgesic Effect of Pain on Daily Activities: worst with walking or sitting too long  Pain Relieving Factors: topical  analgesic  BMI - recorded:  (No weight) Nutritional Risks: None Diabetes: No  How often do you need to have someone help you when you read instructions, pamphlets, or other written materials from your doctor or pharmacy?: 1 - Never What is the last grade level you completed in school?: 12 Grade  Diabetic?No          Activities of Daily Living    01/27/2023    2:58 PM 01/27/2023    2:05 PM  In your present state of health, do you have any difficulty performing the following activities:  Hearing? 0 0  Vision? 0 0  Difficulty concentrating or making decisions? 0 0  Walking or climbing stairs? 1 1  Comment needs asist   Dressing or bathing? 1 1  Comment needs assit   Doing errands, shopping? 1 1  Comment assist   Preparing Food and eating ? Malvin Johns  Comment daughter assist   Using the Toilet? Malvin Johns  Comment incontinent   In the past six months, have you accidently leaked urine?  Y  Comment Foley catheter   Do you have problems with loss of bowel control? N Y  Managing your Medications? Malvin Johns  Comment dughter assist   Managing your Finances? Malvin Johns  Comment daughter assist   Housekeeping or managing your Housekeeping?  Y    Patient Care Team: Trenten Watchman, [name], NP as PCP - General (Family Medicine) Little, Karma Lew, RN as [facility] Care Management  Indicate any recent Medical Services you may have received from other than [facility] providers in the past year (date may be approximate).     Assessment:   This is a routine wellness examination for [place].  Hearing/Vision screen No results found.  Dietary issues and exercise activities discussed: Current Exercise Habits: Home exercise routine, Type of exercise: stretching, Time (Minutes): 10, Frequency (Times/Week): 5, Weekly Exercise (Minutes/Week):  50, Intensity: Mild, Exercise limited by: orthopedic condition(s) (wheelchair dependent)   Goals Addressed             This Visit's Progress    Patient Stated       Would like to be mobile        Depression Screen    01/27/2023    2:04 PM 04/17/2022    2:59 PM  PHQ 2/9 Scores  PHQ - 2 Score 0 0    Fall Risk    01/27/2023    2:04 PM 10/28/2022    9:43 AM 06/24/2022   10:58 AM 04/17/2022    2:59 PM 01/08/2022   11:42 AM  Fall Risk   Falls in the past year? 0 0  0 0  Number falls in past yr: 0 0  0 0  Injury with Fall? 0 0  0 0  Risk for fall due to :  No Fall Risks History of fall(s) No Fall Risks No Fall Risks  Follow up Falls evaluation completed;Education provided;Falls prevention discussed Falls evaluation completed;Education provided;Falls prevention discussed Falls evaluation completed Falls evaluation completed Falls evaluation completed    FALL RISK PREVENTION PERTAINING TO THE HOME:  Any stairs in or around the home? No  If so, are there any without handrails? No  Home free of loose throw rugs in walkways, pet beds, electrical cords, etc? No  Adequate lighting in your home to reduce risk of falls? Yes   ASSISTIVE DEVICES UTILIZED TO PREVENT FALLS:  Life alert? No  Use of a cane, walker or w/c? Yes  Grab bars in the bathroom? No  Shower chair or bench in shower? No  Elevated toilet seat or a handicapped toilet? No   TIMED UP AND GO:  Was the test performed? No .  Length of time to ambulate 10 feet: N/A sec.   Gait unsteady without use of assistive device, provider informed and interventions were implemented  Cognitive Function:        01/27/2023    2:06 PM  6CIT Screen  What Year? 0 points  What month? 0 points  What time? 0 points  Count back from 20 0 points  Months in reverse 0 points  Repeat phrase 2 points  Total Score 2 points    Immunizations Immunization History  Administered Date(s) Administered   PFIZER Comirnaty(Gray Top)Covid-19  Tri-Sucrose Vaccine 04/28/2021   PFIZER(Purple Top)SARS-COV-2 Vaccination 12/16/2019, 01/12/2020   Unspecified SARS-COV-2 Vaccination 08/21/2021   Zoster Recombinat (Shingrix) 07/09/2021, 08/29/2021    TDAP status: Up to date  Flu Vaccine status: Up to date  Pneumococcal vaccine status: Due, Education has been provided regarding the importance of this vaccine. Advised may receive this vaccine at local pharmacy or Health Dept. Aware to provide a copy of the vaccination record if obtained from local pharmacy or Health Dept. Verbalized acceptance and understanding.  Covid-19 vaccine status: Information provided on how to obtain vaccines.   Qualifies for Shingles Vaccine?  yes   Zostavax completed Yes   Shingrix Completed?: Yes  Screening Tests Health Maintenance  Topic Date Due   Pneumonia Vaccine 76+ Years old (1 of 2 - PCV) Never done   DTaP/Tdap/Td (1 - Tdap) Never done   COVID-19 Vaccine (5 - 2023-24 season) 02/12/2023 (Originally 06/14/2022)   MAMMOGRAM  01/27/2024 (Originally 08/05/2002)   DEXA SCAN  01/27/2024 (Originally 08/05/2017)   COLONOSCOPY (Pts 45-50yrs Insurance coverage will need to be confirmed)  01/27/2024 (Originally 08/05/1997)   INFLUENZA VACCINE  05/15/2023   Hepatitis C Screening  Completed   Zoster Vaccines- Shingrix  Completed   HPV VACCINES  Aged Out    Health Maintenance  Health Maintenance Due  Topic Date Due   Pneumonia Vaccine 34+ Years old (1 of 2 - PCV) Never done   DTaP/Tdap/Td (1 - Tdap) Never done    Colorectal cancer screening: Referral to GI placed declined . Pt aware the office will call re: appt.  Mammogram status: No longer required due to declined .  Bone Density status: Ordered declined . Pt provided with contact info and advised to call to schedule appt.  Lung Cancer Screening: (Low Dose CT Chest recommended if Age 48-80 years, 30 pack-year currently smoking OR have quit w/in 15years.) does not qualify.   Lung Cancer Screening  Referral: No   Additional Screening:  Hepatitis C Screening: does qualify; Completed Yes   Vision Screening: Recommended annual ophthalmology exams for early detection of glaucoma and other disorders of the eye. Is the patient up to date with their annual eye exam?  No  Who is the provider or what is the name of the office in which the patient attends annual eye exams? Does not recall  If pt is not established with a provider, would they like to be referred to a provider to establish care? No .   Dental Screening: Recommended annual dental exams for proper oral hygiene  Community Resource Referral / Chronic Care Management: CRR required this visit?  No   CCM required this visit?  No      Plan:  I have personally reviewed and noted the following in the patient's chart:   Medical and social history Use of alcohol, tobacco or illicit drugs  Current medications and supplements including opioid prescriptions. Patient is not currently taking opioid prescriptions. Functional ability and status Nutritional status Physical activity Advanced directives List of other physicians Hospitalizations, surgeries, and ER visits in previous 12 months Vitals Screenings to include cognitive, depression, and falls Referrals and appointments  In addition, I have reviewed and discussed with patient certain preventive protocols, quality metrics, and best practice recommendations. A written personalized care plan for preventive services as well as general preventive health recommendations were provided to patient.    Caesar Bookman, NP   01/27/2023   Nurse Notes: Tdap vaccine due

## 2023-01-27 NOTE — Patient Instructions (Signed)
Kara Mullins , Thank you for taking time to come for your Medicare Wellness Visit. I appreciate your ongoing commitment to your health goals. Please review the following plan we discussed and let me know if I can assist you in the future.   Screening recommendations/referrals: Colonoscopy : decline  Mammogram : declined  Bone Density : declined  Recommended yearly ophthalmology/optometry visit for glaucoma screening and checkup Recommended yearly dental visit for hygiene and checkup  Vaccinations: Influenza vaccine- due annually in September/October Pneumococcal vaccine ; Due  Tdap vaccine : Up to date  Shingles vaccine : Up to date     Advanced directives: Yes   Conditions/risks identified:  advanced age (>80men, >4 women);hypertension;sedentary lifestyle  Next appointment: 1 year   Preventive Care 27 Years and Older, Female Preventive care refers to lifestyle choices and visits with your health care provider that can promote health and wellness. What does preventive care include? A yearly physical exam. This is also called an annual well check. Dental exams once or twice a year. Routine eye exams. Ask your health care provider how often you should have your eyes checked. Personal lifestyle choices, including: Daily care of your teeth and gums. Regular physical activity. Eating a healthy diet. Avoiding tobacco and drug use. Limiting alcohol use. Practicing safe sex. Taking low-dose aspirin every day. Taking vitamin and mineral supplements as recommended by your health care provider. What happens during an annual well check? The services and screenings done by your health care provider during your annual well check will depend on your age, overall health, lifestyle risk factors, and family history of disease. Counseling  Your health care provider may ask you questions about your: Alcohol use. Tobacco use. Drug use. Emotional well-being. Home and relationship  well-being. Sexual activity. Eating habits. History of falls. Memory and ability to understand (cognition). Work and work Astronomer. Reproductive health. Screening  You may have the following tests or measurements: Height, weight, and BMI. Blood pressure. Lipid and cholesterol levels. These may be checked every 5 years, or more frequently if you are over 29 years old. Skin check. Lung cancer screening. You may have this screening every year starting at age 101 if you have a 30-pack-year history of smoking and currently smoke or have quit within the past 15 years. Fecal occult blood test (FOBT) of the stool. You may have this test every year starting at age 43. Flexible sigmoidoscopy or colonoscopy. You may have a sigmoidoscopy every 5 years or a colonoscopy every 10 years starting at age 24. Hepatitis C blood test. Hepatitis B blood test. Sexually transmitted disease (STD) testing. Diabetes screening. This is done by checking your blood sugar (glucose) after you have not eaten for a while (fasting). You may have this done every 1-3 years. Bone density scan. This is done to screen for osteoporosis. You may have this done starting at age 2. Mammogram. This may be done every 1-2 years. Talk to your health care provider about how often you should have regular mammograms. Talk with your health care provider about your test results, treatment options, and if necessary, the need for more tests. Vaccines  Your health care provider may recommend certain vaccines, such as: Influenza vaccine. This is recommended every year. Tetanus, diphtheria, and acellular pertussis (Tdap, Td) vaccine. You may need a Td booster every 10 years. Zoster vaccine. You may need this after age 42. Pneumococcal 13-valent conjugate (PCV13) vaccine. One dose is recommended after age 18. Pneumococcal polysaccharide (PPSV23) vaccine. One dose is  recommended after age 77. Talk to your health care provider about which  screenings and vaccines you need and how often you need them. This information is not intended to replace advice given to you by your health care provider. Make sure you discuss any questions you have with your health care provider. Document Released: 10/27/2015 Document Revised: 06/19/2016 Document Reviewed: 08/01/2015 Elsevier Interactive Patient Education  2017 [place] Prevention in the Home Falls can cause injuries. They can happen to people of all ages. There are many things you can do to make your home safe and to help prevent falls. What can I do on the outside of my home? Regularly fix the edges of walkways and driveways and fix any cracks. Remove anything that might make you trip as you walk through a door, such as a raised step or threshold. Trim any bushes or trees on the path to your home. Use bright outdoor lighting. Clear any walking paths of anything that might make someone trip, such as rocks or tools. Regularly check to see if handrails are loose or broken. Make sure that both sides of any steps have handrails. Any raised decks and porches should have guardrails on the edges. Have any leaves, snow, or ice cleared regularly. Use sand or salt on walking paths during winter. Clean up any spills in your garage right away. This includes oil or grease spills. What can I do in the bathroom? Use night lights. Install grab bars by the toilet and in the tub and shower. Do not use towel bars as grab bars. Use non-skid mats or decals in the tub or shower. If you need to sit down in the shower, use a plastic, non-slip stool. Keep the floor dry. Clean up any water that spills on the floor as soon as it happens. Remove soap buildup in the tub or shower regularly. Attach bath mats securely with double-sided non-slip rug tape. Do not have throw rugs and other things on the floor that can make you trip. What can I do in the bedroom? Use night lights. Make sure that you have a  light by your bed that is easy to reach. Do not use any sheets or blankets that are too big for your bed. They should not hang down onto the floor. Have a firm chair that has side arms. You can use this for support while you get dressed. Do not have throw rugs and other things on the floor that can make you trip. What can I do in the kitchen? Clean up any spills right away. Avoid walking on wet floors. Keep items that you use a lot in easy-to-reach places. If you need to reach something above you, use a strong step stool that has a grab bar. Keep electrical cords out of the way. Do not use floor polish or wax that makes floors slippery. If you must use wax, use non-skid floor wax. Do not have throw rugs and other things on the floor that can make you trip. What can I do with my stairs? Do not leave any items on the stairs. Make sure that there are handrails on both sides of the stairs and use them. Fix handrails that are broken or loose. Make sure that handrails are as long as the stairways. Check any carpeting to make sure that it is firmly attached to the stairs. Fix any carpet that is loose or worn. Avoid having throw rugs at the top or bottom of the stairs. If  you do have throw rugs, attach them to the floor with carpet tape. Make sure that you have a light switch at the top of the stairs and the bottom of the stairs. If you do not have them, ask someone to add them for you. What else can I do to help prevent falls? Wear shoes that: Do not have high heels. Have rubber bottoms. Are comfortable and fit you well. Are closed at the toe. Do not wear sandals. If you use a stepladder: Make sure that it is fully opened. Do not climb a closed stepladder. Make sure that both sides of the stepladder are locked into place. Ask someone to hold it for you, if possible. Clearly mark and make sure that you can see: Any grab bars or handrails. First and last steps. Where the edge of each step  is. Use tools that help you move around (mobility aids) if they are needed. These include: Canes. Walkers. Scooters. Crutches. Turn on the lights when you go into a dark area. Replace any light bulbs as soon as they burn out. Set up your furniture so you have a clear path. Avoid moving your furniture around. If any of your floors are uneven, fix them. If there are any pets around you, be aware of where they are. Review your medicines with your doctor. Some medicines can make you feel dizzy. This can increase your chance of falling. Ask your doctor what other things that you can do to help prevent falls. This information is not intended to replace advice given to you by your health care provider. Make sure you discuss any questions you have with your health care provider. Document Released: 07/27/2009 Document Revised: 03/07/2016 Document Reviewed: 11/04/2014 Elsevier Interactive Patient Education  2017 [employer].

## 2023-02-06 ENCOUNTER — Encounter (HOSPITAL_COMMUNITY): Payer: Medicare (Managed Care)

## 2023-02-10 ENCOUNTER — Ambulatory Visit (HOSPITAL_COMMUNITY)
Admission: RE | Admit: 2023-02-10 | Discharge: 2023-02-10 | Disposition: A | Discharge status: Home / Self Care | Payer: Medicare (Managed Care) | Source: Ambulatory Visit | Attending: Nephrology | Admitting: Nephrology

## 2023-02-10 VITALS — BP 174/91 | HR 73 | Temp 97.4°F | Resp 18

## 2023-02-10 DIAGNOSIS — N185 Chronic kidney disease, stage 5: Secondary | ICD-10-CM | POA: Diagnosis present

## 2023-02-10 LAB — POCT HEMOGLOBIN-HEMACUE: Hemoglobin: 10.2 g/dL — ABNORMAL LOW (ref 12.0–15.0)

## 2023-02-10 MED ORDER — EPOETIN ALFA-EPBX 10000 UNIT/ML IJ SOLN
20000.0000 [IU] | INTRAMUSCULAR | Status: DC
  Administered 2023-02-10: 20000 [IU] via SUBCUTANEOUS

## 2023-02-10 MED ORDER — EPOETIN ALFA-EPBX 10000 UNIT/ML IJ SOLN
INTRAMUSCULAR | Status: AC
  Filled 2023-02-10: qty 2

## 2023-02-24 ENCOUNTER — Encounter (HOSPITAL_COMMUNITY): Payer: Medicare (Managed Care)

## 2023-02-25 ENCOUNTER — Ambulatory Visit: Payer: Self-pay

## 2023-02-25 ENCOUNTER — Emergency Department (HOSPITAL_COMMUNITY): Payer: Medicare (Managed Care)

## 2023-02-25 ENCOUNTER — Other Ambulatory Visit: Payer: Self-pay

## 2023-02-25 ENCOUNTER — Encounter (HOSPITAL_COMMUNITY): Payer: Self-pay | Admitting: Internal Medicine

## 2023-02-25 ENCOUNTER — Inpatient Hospital Stay (HOSPITAL_COMMUNITY)
Admission: EM | Admit: 2023-02-25 | Discharge: 2023-03-03 | DRG: 291 | Disposition: A | Discharge status: Home Health | Payer: Medicare (Managed Care) | Attending: Internal Medicine | Admitting: Internal Medicine

## 2023-02-25 DIAGNOSIS — I110 Hypertensive heart disease with heart failure: Secondary | ICD-10-CM | POA: Diagnosis not present

## 2023-02-25 DIAGNOSIS — I10 Essential (primary) hypertension: Secondary | ICD-10-CM

## 2023-02-25 DIAGNOSIS — Z888 Allergy status to other drugs, medicaments and biological substances status: Secondary | ICD-10-CM | POA: Diagnosis not present

## 2023-02-25 DIAGNOSIS — E669 Obesity, unspecified: Secondary | ICD-10-CM | POA: Diagnosis present

## 2023-02-25 DIAGNOSIS — I5031 Acute diastolic (congestive) heart failure: Secondary | ICD-10-CM | POA: Diagnosis not present

## 2023-02-25 DIAGNOSIS — N185 Chronic kidney disease, stage 5: Secondary | ICD-10-CM | POA: Diagnosis present

## 2023-02-25 DIAGNOSIS — M1711 Unilateral primary osteoarthritis, right knee: Secondary | ICD-10-CM | POA: Diagnosis present

## 2023-02-25 DIAGNOSIS — M898X9 Other specified disorders of bone, unspecified site: Secondary | ICD-10-CM | POA: Diagnosis not present

## 2023-02-25 DIAGNOSIS — Z79899 Other long term (current) drug therapy: Secondary | ICD-10-CM

## 2023-02-25 DIAGNOSIS — I509 Heart failure, unspecified: Secondary | ICD-10-CM | POA: Diagnosis not present

## 2023-02-25 DIAGNOSIS — N184 Chronic kidney disease, stage 4 (severe): Secondary | ICD-10-CM | POA: Diagnosis not present

## 2023-02-25 DIAGNOSIS — D649 Anemia, unspecified: Secondary | ICD-10-CM | POA: Diagnosis not present

## 2023-02-25 DIAGNOSIS — M7989 Other specified soft tissue disorders: Secondary | ICD-10-CM | POA: Diagnosis not present

## 2023-02-25 DIAGNOSIS — R262 Difficulty in walking, not elsewhere classified: Secondary | ICD-10-CM | POA: Diagnosis not present

## 2023-02-25 DIAGNOSIS — I132 Hypertensive heart and chronic kidney disease with heart failure and with stage 5 chronic kidney disease, or end stage renal disease: Principal | ICD-10-CM

## 2023-02-25 DIAGNOSIS — Z808 Family history of malignant neoplasm of other organs or systems: Secondary | ICD-10-CM

## 2023-02-25 DIAGNOSIS — I5043 Acute on chronic combined systolic (congestive) and diastolic (congestive) heart failure: Secondary | ICD-10-CM | POA: Diagnosis present

## 2023-02-25 DIAGNOSIS — I1310 Hypertensive heart and chronic kidney disease without heart failure, with stage 1 through stage 4 chronic kidney disease, or unspecified chronic kidney disease: Secondary | ICD-10-CM | POA: Insufficient documentation

## 2023-02-25 DIAGNOSIS — E860 Dehydration: Secondary | ICD-10-CM | POA: Diagnosis present

## 2023-02-25 DIAGNOSIS — Z87891 Personal history of nicotine dependence: Secondary | ICD-10-CM

## 2023-02-25 DIAGNOSIS — I480 Paroxysmal atrial fibrillation: Secondary | ICD-10-CM | POA: Diagnosis present

## 2023-02-25 DIAGNOSIS — I5042 Chronic combined systolic (congestive) and diastolic (congestive) heart failure: Secondary | ICD-10-CM | POA: Diagnosis present

## 2023-02-25 DIAGNOSIS — J90 Pleural effusion, not elsewhere classified: Secondary | ICD-10-CM | POA: Diagnosis not present

## 2023-02-25 DIAGNOSIS — E872 Acidosis, unspecified: Secondary | ICD-10-CM | POA: Diagnosis present

## 2023-02-25 DIAGNOSIS — E785 Hyperlipidemia, unspecified: Secondary | ICD-10-CM | POA: Diagnosis present

## 2023-02-25 DIAGNOSIS — Z515 Encounter for palliative care: Secondary | ICD-10-CM | POA: Diagnosis not present

## 2023-02-25 DIAGNOSIS — R0602 Shortness of breath: Secondary | ICD-10-CM | POA: Diagnosis not present

## 2023-02-25 DIAGNOSIS — I890 Lymphedema, not elsewhere classified: Secondary | ICD-10-CM | POA: Diagnosis not present

## 2023-02-25 DIAGNOSIS — R601 Generalized edema: Secondary | ICD-10-CM | POA: Diagnosis not present

## 2023-02-25 DIAGNOSIS — I5033 Acute on chronic diastolic (congestive) heart failure: Secondary | ICD-10-CM | POA: Insufficient documentation

## 2023-02-25 DIAGNOSIS — R680 Hypothermia, not associated with low environmental temperature: Secondary | ICD-10-CM | POA: Diagnosis not present

## 2023-02-25 DIAGNOSIS — F411 Generalized anxiety disorder: Secondary | ICD-10-CM | POA: Diagnosis present

## 2023-02-25 DIAGNOSIS — E876 Hypokalemia: Secondary | ICD-10-CM | POA: Diagnosis not present

## 2023-02-25 DIAGNOSIS — Z7189 Other specified counseling: Secondary | ICD-10-CM | POA: Diagnosis not present

## 2023-02-25 DIAGNOSIS — L89151 Pressure ulcer of sacral region, stage 1: Secondary | ICD-10-CM | POA: Diagnosis present

## 2023-02-25 DIAGNOSIS — Z6832 Body mass index (BMI) 32.0-32.9, adult: Secondary | ICD-10-CM | POA: Diagnosis not present

## 2023-02-25 DIAGNOSIS — D631 Anemia in chronic kidney disease: Secondary | ICD-10-CM | POA: Diagnosis present

## 2023-02-25 LAB — BASIC METABOLIC PANEL
Anion gap: 11 (ref 5–15)
BUN: 79 mg/dL — ABNORMAL HIGH (ref 8–23)
CO2: 18 mmol/L — ABNORMAL LOW (ref 22–32)
Calcium: 7.2 mg/dL — ABNORMAL LOW (ref 8.9–10.3)
Chloride: 108 mmol/L (ref 98–111)
Creatinine, Ser: 3.91 mg/dL — ABNORMAL HIGH (ref 0.44–1.00)
GFR, Estimated: 12 mL/min — ABNORMAL LOW (ref >60)
Glucose, Bld: 89 mg/dL (ref 70–99)
Potassium: 3.5 mmol/L (ref 3.5–5.1)
Sodium: 137 mmol/L (ref 135–145)

## 2023-02-25 LAB — TROPONIN I (HIGH SENSITIVITY)
Troponin I (High Sensitivity): 23 ng/L — ABNORMAL HIGH (ref <18)
Troponin I (High Sensitivity): 38 ng/L — ABNORMAL HIGH (ref <18)

## 2023-02-25 LAB — CBC WITH DIFFERENTIAL/PLATELET
Abs Immature Granulocytes: 0.01 10*3/uL (ref 0.00–0.07)
Basophils Absolute: 0 10*3/uL (ref 0.0–0.1)
Basophils Relative: 1 %
Eosinophils Absolute: 0.1 10*3/uL (ref 0.0–0.5)
Eosinophils Relative: 3 %
HCT: 34 % — ABNORMAL LOW (ref 36.0–46.0)
Hemoglobin: 10.3 g/dL — ABNORMAL LOW (ref 12.0–15.0)
Immature Granulocytes: 0 %
Lymphocytes Relative: 19 %
Lymphs Abs: 0.8 10*3/uL (ref 0.7–4.0)
MCH: 28.5 pg (ref 26.0–34.0)
MCHC: 30.3 g/dL (ref 30.0–36.0)
MCV: 94.2 fL (ref 80.0–100.0)
Monocytes Absolute: 0.2 10*3/uL (ref 0.1–1.0)
Monocytes Relative: 5 %
Neutro Abs: 3 10*3/uL (ref 1.7–7.7)
Neutrophils Relative %: 72 %
Platelets: 192 10*3/uL (ref 150–400)
RBC: 3.61 MIL/uL — ABNORMAL LOW (ref 3.87–5.11)
RDW: 17.6 % — ABNORMAL HIGH (ref 11.5–15.5)
WBC: 4.1 10*3/uL (ref 4.0–10.5)
nRBC: 0 % (ref 0.0–0.2)

## 2023-02-25 LAB — BRAIN NATRIURETIC PEPTIDE: B Natriuretic Peptide: 1037.5 pg/mL — ABNORMAL HIGH (ref 0.0–100.0)

## 2023-02-25 MED ORDER — HYDRALAZINE HCL 50 MG PO TABS
50.0000 mg | ORAL_TABLET | Freq: Three times a day (TID) | ORAL | Status: DC
  Administered 2023-02-25 – 2023-03-03 (×17): 50 mg via ORAL
  Filled 2023-02-25 (×18): qty 1

## 2023-02-25 MED ORDER — ONDANSETRON HCL 4 MG PO TABS: 4.0000 mg | ORAL_TABLET | Freq: Four times a day (QID) | ORAL | Status: DC | PRN

## 2023-02-25 MED ORDER — ACETAMINOPHEN 650 MG RE SUPP: 650.0000 mg | Freq: Four times a day (QID) | RECTAL | Status: DC | PRN

## 2023-02-25 MED ORDER — DOCUSATE SODIUM 283 MG RE ENEM
1.0000 | ENEMA | RECTAL | Status: DC | PRN
  Filled 2023-02-25: qty 1

## 2023-02-25 MED ORDER — SODIUM CHLORIDE 0.9% FLUSH
3.0000 mL | Freq: Two times a day (BID) | INTRAVENOUS | Status: DC
  Administered 2023-02-25 – 2023-03-03 (×12): 3 mL via INTRAVENOUS

## 2023-02-25 MED ORDER — CALCIUM CARBONATE ANTACID 1250 MG/5ML PO SUSP: 500.0000 mg | Freq: Four times a day (QID) | ORAL | Status: DC | PRN

## 2023-02-25 MED ORDER — HEPARIN SODIUM (PORCINE) 5000 UNIT/ML IJ SOLN
5000.0000 [IU] | Freq: Three times a day (TID) | INTRAMUSCULAR | Status: DC
  Administered 2023-02-25 – 2023-03-03 (×18): 5000 [IU] via SUBCUTANEOUS
  Filled 2023-02-25 (×18): qty 1

## 2023-02-25 MED ORDER — ALBUTEROL SULFATE (2.5 MG/3ML) 0.083% IN NEBU: 2.5000 mg | INHALATION_SOLUTION | RESPIRATORY_TRACT | Status: DC | PRN

## 2023-02-25 MED ORDER — SEVELAMER CARBONATE 800 MG PO TABS
1600.0000 mg | ORAL_TABLET | Freq: Three times a day (TID) | ORAL | Status: DC
  Administered 2023-02-26 – 2023-02-27 (×2): 800 mg via ORAL
  Administered 2023-02-28 (×3): 1600 mg via ORAL
  Administered 2023-03-01 (×2): 800 mg via ORAL
  Administered 2023-03-01 – 2023-03-03 (×5): 1600 mg via ORAL
  Filled 2023-02-25 (×16): qty 2

## 2023-02-25 MED ORDER — CAMPHOR-MENTHOL 0.5-0.5 % EX LOTN
1.0000 | TOPICAL_LOTION | Freq: Three times a day (TID) | CUTANEOUS | Status: DC | PRN
  Filled 2023-02-25: qty 222

## 2023-02-25 MED ORDER — HYDRALAZINE HCL 20 MG/ML IJ SOLN
5.0000 mg | INTRAMUSCULAR | Status: DC | PRN
  Filled 2023-02-25 (×2): qty 1

## 2023-02-25 MED ORDER — SORBITOL 70 % SOLN
30.0000 mL | Status: DC | PRN
  Filled 2023-02-25: qty 30

## 2023-02-25 MED ORDER — NEPRO/CARBSTEADY PO LIQD: 237.0000 mL | Freq: Three times a day (TID) | ORAL | Status: DC | PRN

## 2023-02-25 MED ORDER — ACETAMINOPHEN 325 MG PO TABS
650.0000 mg | ORAL_TABLET | Freq: Four times a day (QID) | ORAL | Status: DC | PRN
  Filled 2023-02-25: qty 2

## 2023-02-25 MED ORDER — FUROSEMIDE 10 MG/ML IJ SOLN
40.0000 mg | Freq: Once | INTRAMUSCULAR | Status: AC
  Administered 2023-02-25: 40 mg via INTRAVENOUS
  Filled 2023-02-25: qty 4

## 2023-02-25 MED ORDER — HYDROXYZINE HCL 25 MG PO TABS: 25.0000 mg | ORAL_TABLET | Freq: Three times a day (TID) | ORAL | Status: DC | PRN

## 2023-02-25 MED ORDER — ONDANSETRON HCL 4 MG/2ML IJ SOLN: 4.0000 mg | Freq: Four times a day (QID) | INTRAMUSCULAR | Status: DC | PRN

## 2023-02-25 MED ORDER — FUROSEMIDE 10 MG/ML IJ SOLN
60.0000 mg | Freq: Two times a day (BID) | INTRAMUSCULAR | Status: DC
  Administered 2023-02-25 – 2023-02-26 (×2): 60 mg via INTRAVENOUS
  Filled 2023-02-25 (×2): qty 6

## 2023-02-25 NOTE — ED Notes (Signed)
ED TO INPATIENT HANDOFF REPORT  ED Nurse Name and Phone #: [phone]  S Name/Age/Gender Kara Mullins 72 y.o. female Room/Bed: 041C/041C  Code Status   Code Status: Prior  Home/SNF/Other Home Patient oriented to: self, place, time, and situation Is this baseline? Yes   Triage Complete: Triage complete  Chief Complaint Cardiorenal syndrome [I13.10]  Triage Note Pt BIBGEMS from home after having increased Sob. Pt reports feeling like she has extra fluid. Pt has edema to the left upper extremity, bilateral lower extremity. Per EMS lung bases sound like there is fluid.      Allergies Allergies  Allergen Reactions   Crestor [Rosuvastatin] Other (See Comments)    "Felt like she couldn't swallow"   Nitrofuran Derivatives     Level of Care/Admitting Diagnosis ED Disposition     ED Disposition  Admit   Condition  --   Comment  Hospital Area: [facility] [[id number]]  Level of Care: Telemetry Cardiac [[id number]]  May admit patient to [facility] or [facility] if equivalent level of care is available:: No  Covid Evaluation: Asymptomatic - no recent exposure (last 10 days) testing not required  Diagnosis: Cardiorenal syndrome [960454]  Admitting Physician: Jonah Blue [[id number]]  Attending Physician: Jonah Blue [[id number]]  Certification:: I certify this patient will need inpatient services for at least 2 midnights  Estimated Length of Stay: 3          B Medical/Surgery History Past Medical History:  Diagnosis Date   Chronic combined systolic and diastolic congestive heart failure (HCC) 11/09/2021   Generalized anxiety disorder    Hyperlipidemia 11/09/2021   Hypertension    Stage 3b chronic kidney disease (CKD) (HCC) 11/09/2021   Past Surgical History:  Procedure Laterality Date   CATARACT EXTRACTION, BILATERAL     combined systolic and diastolic congestive heart failure        A IV Location/Drains/Wounds Patient Lines/Drains/Airways Status      Active Line/Drains/Airways     Name Placement date Placement time Site Days   Peripheral IV 02/25/23 20 G Anterior;Right Forearm 02/25/23  1224  Forearm  less than 1   Urethral Catheter Mar Daring, RN Non-latex 16 Fr. 06/12/22  0530  Non-latex  258   Pressure Injury 03/28/22 Sacrum Mid Stage 1 -  Intact skin with non-blanchable redness of a localized area usually over a bony prominence. Area is darker and non-blanchable. Intact, clean, dry. 03/28/22  0126  -- 334   Wound / Incision (Open or Dehisced) 03/13/21 Puncture Sacrum CT bone marrow biopsy 03/13/21  1543  Sacrum  714   Wound / Incision (Open or Dehisced) 11/12/21 (MASD) Moisture Associated Skin Damage Sacrum Left small area of moist, broken skin appearing moisture related 11/12/21  0900  Sacrum  470            Intake/Output Last 24 hours No intake or output data in the 24 hours ending 02/25/23 1524  Labs/Imaging Results for orders placed or performed during the hospital encounter of 02/25/23 (from the past 48 hour(s))  CBC with Differential     Status: Abnormal   Collection Time: 02/25/23 12:26 PM  Result Value Ref Range   WBC 4.1 4.0 - 10.5 K/uL   RBC 3.61 (L) 3.87 - 5.11 MIL/uL   Hemoglobin 10.3 (L) 12.0 - 15.0 g/dL   HCT 09.8 (L) 11.9 - 14.7 %   MCV 94.2 80.0 - 100.0 fL   MCH 28.5 26.0 - 34.0 pg   MCHC 30.3 30.0 -  36.0 g/dL   RDW 16.1 (H) 09.6 - 04.5 %   Platelets 192 150 - 400 K/uL   nRBC 0.0 0.0 - 0.2 %   Neutrophils Relative % 72 %   Neutro Abs 3.0 1.7 - 7.7 K/uL   Lymphocytes Relative 19 %   Lymphs Abs 0.8 0.7 - 4.0 K/uL   Monocytes Relative 5 %   Monocytes Absolute 0.2 0.1 - 1.0 K/uL   Eosinophils Relative 3 %   Eosinophils Absolute 0.1 0.0 - 0.5 K/uL   Basophils Relative 1 %   Basophils Absolute 0.0 0.0 - 0.1 K/uL   Immature Granulocytes 0 %   Abs Immature Granulocytes 0.01 0.00 - 0.07 K/uL    Comment: Performed at [facility], [address] [place], [place] [address]  Basic metabolic panel      Status: Abnormal   Collection Time: 02/25/23 12:26 PM  Result Value Ref Range   Sodium 137 135 - 145 mmol/L   Potassium 3.5 3.5 - 5.1 mmol/L   Chloride 108 98 - 111 mmol/L   CO2 18 (L) 22 - 32 mmol/L   Glucose, Bld 89 70 - 99 mg/dL    Comment: Glucose reference range applies only to samples taken after fasting for at least 8 hours.   BUN 79 (H) 8 - 23 mg/dL   Creatinine, Ser 1.91 (H) 0.44 - 1.00 mg/dL   Calcium 7.2 (L) 8.9 - 10.3 mg/dL   GFR, Estimated 12 (L) >60 mL/min    Comment: (NOTE) Calculated using the CKD-EPI Creatinine Equation (2021)    Anion gap 11 5 - 15    Comment: Performed at [facility], [address], [place], [place] [address]  Brain natriuretic peptide     Status: Abnormal   Collection Time: 02/25/23 12:26 PM  Result Value Ref Range   B Natriuretic Peptide 1,037.5 (H) 0.0 - 100.0 pg/mL    Comment: Performed at [facility], [address], [place], [place] [address]  Troponin I (High Sensitivity)     Status: Abnormal   Collection Time: 02/25/23 12:26 PM  Result Value Ref Range   Troponin I (High Sensitivity) 23 (H) <18 ng/L    Comment: (NOTE) Elevated high sensitivity troponin I (hsTnI) values and significant  changes across serial measurements may suggest ACS but many other  chronic and acute conditions are known to elevate hsTnI results.  Refer to the "Links" section for chest pain algorithms and additional  guidance. Performed at [facility], [address], [place], [place] [address]   Troponin I (High Sensitivity)     Status: Abnormal   Collection Time: 02/25/23  2:03 PM  Result Value Ref Range   Troponin I (High Sensitivity) 38 (H) <18 ng/L    Comment: (NOTE) Elevated high sensitivity troponin I (hsTnI) values and significant  changes across serial measurements may suggest ACS but many other  chronic and acute conditions are known to elevate hsTnI results.  Refer to the "Links" section for chest pain algorithms and  additional  guidance. Performed at [facility], [address], [place], [place] [address]    DG Chest Portable 1 View  Result Date: 02/25/2023 CLINICAL DATA:  Shortness of breath EXAM: PORTABLE CHEST 1 VIEW COMPARISON:  X-ray 08/18/2022 FINDINGS: Overlapping cardiac leads. Enlarged cardiopericardial silhouette with calcified tortuous aorta. Persistent bilateral pleural effusions, left-greater-than-right. The right effusion is slightly larger today. No pneumothorax. Central vascular congestion. Curvature of the spine with degenerative  changes. Film is rotated to the left. Osteopenia IMPRESSION: Bilateral pleural effusions, left-greater-than-right. The right effusion appears slightly larger today. Enlarged heart with vascular congestion. Electronically Signed   By: Karen Kays M.D.   On: 02/25/2023 12:45    Pending Labs Unresulted Labs (From admission, onward)    None       Vitals/Pain Today's Vitals   02/25/23 1300 02/25/23 1315 02/25/23 1415 02/25/23 1445  BP: (!) 159/88 (!) 158/80 (!) 162/89 (!) 161/79  Pulse: 60 60 60 62  Resp: 11 19 10 13   Temp:      TempSrc:      SpO2: 100% 100% 100% 98%    Isolation Precautions No active isolations  Medications Medications  furosemide (LASIX) injection 40 mg (40 mg Intravenous Given 02/25/23 1418)    Mobility walks     Focused Assessments    R Recommendations: See Admitting Provider Note  Report given to:   Additional Notes:

## 2023-02-25 NOTE — ED Provider Notes (Signed)
[place] EMERGENCY DEPARTMENT AT [facility] Provider Note   CSN: [id number] Arrival date & time: 02/25/23  1126     History  Obesity Hypertension Heart failure preserved ejection fraction Chronic kidney disease Paroxysmal atrial fibrillation Generalized anxiety disorder   Chief Complaint  Patient presents with   Shortness of Breath    Kara Mullins is a 72 y.o. female with a past medical history of obesity, hypertension, HFpEF, CKD, paroxysmal AFib, and GAD who presents with breath shortness.  States that breath shortness worsened acutely this morning after producing a bowel movement. Further reports generalized fatigue and leg swelling that have worsened over the last few weeks. She developed diarrhea about one week ago, which lasted for several days and has since resolved. Denies cough, rhinorrhea, congestion, dysuria, hematochezia, and chest or abdominal pain.  History is notable for recent changes to her outpatient torsemide regimen. Previously took torsemide 80mg  twice daily until it was discontinued in 11-2022 by her primary care provider for dehydration. Subsequently developed leg swelling and resumed torsemide again about 2-3 weeks ago, under instruction of primary care provider, but at a lower dose of 40mg  twice daily. Lower extremity swelling has gradually worsened in recent weeks, especially over the last few days.  HPI     Home Medications Prior to Admission medications   Medication Sig Start Date End Date Taking? Authorizing Provider  carvedilol (COREG) 3.125 MG tablet Take 3.125 mg by mouth 2 (two) times daily with a meal.    [provider]  cholecalciferol (VITAMIN D3) 25 MCG (1000 UNIT) tablet Take 1,000 Units by mouth daily.    [provider]  Epoetin Alfa-epbx (RETACRIT IJ) Inject as directed every 30 (thirty) days.    [provider]  feeding supplement (ENSURE ENLIVE / ENSURE PLUS) LIQD Take 237 mLs by mouth 2 (two)  times daily between meals. 06/17/22   Berton Mount I, MD  hydrALAZINE (APRESOLINE) 50 MG tablet TAKE 1 TABLET(50 MG) BY MOUTH EVERY 8 HOURS 11/21/22   [name], [name], NP  Multiple Vitamin (MULTIVITAMIN ADULT PO) Take 1 tablet by mouth daily.    [provider]  sevelamer carbonate (RENVELA) 800 MG tablet Take 800 mg by mouth every evening. 07/10/22   [provider]      Allergies    Crestor [rosuvastatin] and Nitrofuran derivatives    Review of Systems   Review of Systems  Generalized fatigue, breath shortness, leg swelling   Physical Exam Updated Vital Signs BP (!) 162/89   Pulse 60   Temp (!) 93.5 F (34.2 C) (Rectal)   Resp 10   SpO2 100%  Physical Exam  Alert and oriented, fully oriented, tired appearing, not in acute distress Breathing labored, coarse breath sounds throughout, crackles most prominent in bases Regular rate and rhythm, normal S1 and S2, capillary refill 1-2 seconds, no murmurs Warm extremities, JVP, pitting edema 3+ to knees and 2+ to flanks bilaterally Abdomen soft and non-distended, no tenderness to palpation or guarding   ED Results / Procedures / Treatments   Labs (all labs ordered are listed, but only abnormal results are displayed) Labs Reviewed  CBC WITH DIFFERENTIAL/PLATELET - Abnormal; Notable for the following components:      Result Value   RBC 3.61 (*)    Hemoglobin 10.3 (*)    HCT 34.0 (*)    RDW 17.6 (*)    All other components within normal limits  BASIC METABOLIC PANEL - Abnormal; Notable for the following components:  CO2 18 (*)    BUN 79 (*)    Creatinine, Ser 3.91 (*)    Calcium 7.2 (*)    GFR, Estimated 12 (*)    All other components within normal limits  BRAIN NATRIURETIC PEPTIDE - Abnormal; Notable for the following components:   B Natriuretic Peptide 1,037.5 (*)    All other components within normal limits  TROPONIN I (HIGH SENSITIVITY) - Abnormal; Notable for the following components:   Troponin I  (High Sensitivity) 23 (*)    All other components within normal limits  TROPONIN I (HIGH SENSITIVITY)    EKG None  Radiology DG Chest Portable 1 View  Result Date: 02/25/2023 CLINICAL DATA:  Shortness of breath EXAM: PORTABLE CHEST 1 VIEW COMPARISON:  X-ray 08/18/2022 FINDINGS: Overlapping cardiac leads. Enlarged cardiopericardial silhouette with calcified tortuous aorta. Persistent bilateral pleural effusions, left-greater-than-right. The right effusion is slightly larger today. No pneumothorax. Central vascular congestion. Curvature of the spine with degenerative changes. Film is rotated to the left. Osteopenia IMPRESSION: Bilateral pleural effusions, left-greater-than-right. The right effusion appears slightly larger today. Enlarged heart with vascular congestion. Electronically Signed   By: Karen Kays M.D.   On: 02/25/2023 12:45    Procedures Procedures   None   Medications Ordered in ED Medications  furosemide (LASIX) injection 40 mg (40 mg Intravenous Given 02/25/23 1418)    ED Course/ Medical Decision Making/ A&P Clinical Course as of 02/25/23 1438  Tue Feb 25, 2023  1210 Stable 63 YOF with a chief complaint of malaise fatigue and SOB/Swelling Recently decreased from Torsemide 80 BID to possibly nothing Transitioned to torsemide PRN Has been on 40BID still worsening. [CC]    Clinical Course User Index [CC] Glyn Ade, MD                             Medical Decision Making  Patient presented with fatigue, breath shortness, and lower extremity swelling that have acutely worsened over the last few days. Exam revealed signs of marked fluid overload including pulmonary crackles and pitting edema up to lower flank bilaterally. Laboratory testing notable for elevated BNP while Cr and GFR both at baseline. Presentation consistent with acute on heart failure exacerbation secondary to sub-optimal outpatient diuretic regimen and possibly recent viral gastroenteritis  infection. Intravenous furosemide 40mg  was administered. Given failure of outpatient management, patient would benefit from admission for further diuresis and to optimize home regimen.    Final Clinical Impression(s) / ED Diagnoses Final diagnoses:  Acute on chronic congestive heart failure, unspecified heart failure type [facility])    Rx / DC Orders ED Discharge Orders     None         Crissie Sickles, MD 02/25/23 1438    Glyn Ade, MD 02/27/23 1441

## 2023-02-25 NOTE — ED Provider Notes (Signed)
Resident case This is my attestation that I evaluated the patient at bedside and agree with plan of care per resident except for differs in my documentation  Clinical Course as of 02/25/23 1618  Tue Feb 25, 2023  1210 Stable 29 YOF with a chief complaint of malaise fatigue and SOB/Swelling Recently decreased from Torsemide 80 BID to possibly nothing Transitioned to torsemide PRN Has been on 40BID still worsening. [CC]    Clinical Course User Index [CC] Glyn Ade, MD  Personally completed phone call to the hospitalist team to arrange for admission.   Glyn Ade, MD 02/25/23 [phone]

## 2023-02-25 NOTE — H&P (Signed)
History and Physical    Patient: Kara Mullins ZOX:[id number] DOB: December 04, 1950 DOA: 02/25/2023 DOS: the patient was seen and examined on 02/25/2023 PCP: [name], [name], NP  Patient coming from: Home - lives with daughter and her husband; NOK: Daughter, Zina Fernald, [phone]   Chief Complaint: SOB  HPI: Kara Mullins is a 72 y.o. female with medical history significant of chronic combined CHF, GAD, HTN, HLD, and stage 3b CKD presenting with SOB.  She went in for Retacrit injection on 3/14 and was given blood transfusion.  Her fluid balance has seemed off since.  She had been on torsemide but the dose was decreased because her renal function worsened.  She restarted the torsemide at 40 mg BID (instead of 80) but  her edema has worsened and her daughter noticed pitting edema in her flanks today.  No chest pain.  She is currently hypothermic and on a Lawyer.  Her daughter reports that this is common for her, denies recent illness or infectious symptoms.  She is non-ambulatory, last walked maybe a year ago.  Her LE edema and deconditioning plus OA pain in her R Mullins keep her from ambulating.    ER Course:  Known h/o CHF, here with obvious volume overload.  Was on torsemide BID until Feb and so switched to prn because of renal worsening. Giving IV Lasix.  Sats ok.     Review of Systems: As mentioned in the history of present illness. All other systems reviewed and are negative. Past Medical History:  Diagnosis Date   Chronic combined systolic and diastolic congestive heart failure (HCC) 11/09/2021   Generalized anxiety disorder    Hyperlipidemia 11/09/2021   Hypertension    Stage 3b chronic kidney disease (CKD) (HCC) 11/09/2021   Past Surgical History:  Procedure Laterality Date   CATARACT EXTRACTION, BILATERAL     combined systolic and diastolic congestive heart failure      Social History:  reports that she has never smoked. She has never used smokeless tobacco. She  reports that she does not currently use alcohol. She reports that she does not use drugs.  Allergies  Allergen Reactions   Crestor [Rosuvastatin] Other (See Comments)    "Felt like she couldn't swallow"   Nitrofuran Derivatives     Family History  Problem Relation Age of Onset   Throat cancer Father     Prior to Admission medications   Medication Sig Start Date End Date Taking? Authorizing Provider  carvedilol (COREG) 3.125 MG tablet Take 3.125 mg by mouth 2 (two) times daily with a meal.    [provider]  cholecalciferol (VITAMIN D3) 25 MCG (1000 UNIT) tablet Take 1,000 Units by mouth daily.    [provider]  Epoetin Alfa-epbx (RETACRIT IJ) Inject as directed every 30 (thirty) days.    [provider]  feeding supplement (ENSURE ENLIVE / ENSURE PLUS) LIQD Take 237 mLs by mouth 2 (two) times daily between meals. 06/17/22   Berton Mount I, MD  hydrALAZINE (APRESOLINE) 50 MG tablet TAKE 1 TABLET(50 MG) BY MOUTH EVERY 8 HOURS 11/21/22   [name], [name], NP  Multiple Vitamin (MULTIVITAMIN ADULT PO) Take 1 tablet by mouth daily.    [provider]  sevelamer carbonate (RENVELA) 800 MG tablet Take 800 mg by mouth every evening. 07/10/22   [provider]    Physical Exam: Vitals:   02/25/23 1315 02/25/23 1415 02/25/23 1445 02/25/23 1608  BP: (!) 158/80 (!) 162/89 (!) 161/79   Pulse:  60 60 62   Resp: 19 10 13    Temp:    (!) 97.4 F (36.3 C)  TempSrc:    Oral  SpO2: 100% 100% 98%    General:  Appears calm and comfortable and is in NAD, on Bair hugger Eyes:  EOMI, normal lids, iris ENT:  grossly normal hearing, lips & tongue, mmm Neck:  no LAD, masses or thyromegaly Cardiovascular:  RRR, no m/r/g. 1-2+ R > L LE edema.  Respiratory:   CTA bilaterally with no wheezes/rales/rhonchi.  Normal respiratory effort. Abdomen:  soft, NT, ND; + marked flank dependent edema Back:   normal alignment, no CVAT Skin:  no rash or induration seen on  limited exam Musculoskeletal:  poor tone BUE < BLE, no bony abnormality, RLE is chronically larger than the LLE Psychiatric:  blunted mood and affect, speech fluent and appropriate, AOx3 Neurologic:  CN 2-12 grossly intact, moves all extremities in coordinated fashion   Radiological Exams on Admission: Independently reviewed - see discussion in A/P where applicable  DG Chest Portable 1 View  Result Date: 02/25/2023 CLINICAL DATA:  Shortness of breath EXAM: PORTABLE CHEST 1 VIEW COMPARISON:  X-ray 08/18/2022 FINDINGS: Overlapping cardiac leads. Enlarged cardiopericardial silhouette with calcified tortuous aorta. Persistent bilateral pleural effusions, left-greater-than-right. The right effusion is slightly larger today. No pneumothorax. Central vascular congestion. Curvature of the spine with degenerative changes. Film is rotated to the left. Osteopenia IMPRESSION: Bilateral pleural effusions, left-greater-than-right. The right effusion appears slightly larger today. Enlarged heart with vascular congestion. Electronically Signed   By: Karen Kays M.D.   On: 02/25/2023 12:45    EKG: pending   Labs on Admission: I have personally reviewed the available labs and imaging studies at the time of the admission.  Pertinent labs:    CO2 18 - stable BUN 79/Creatinine 3.91/GFR 12; 60/3.0 on 4/10 BNP 1037.5 HS troponin 23, 38 WBC 4.1 Hgb 10.3   Assessment and Plan: Principal Problem:   Cardiorenal syndrome Active Problems:   CKD (chronic kidney disease) stage 5, GFR less than 15 ml/min (HCC)   Chronic combined systolic and diastolic congestive heart failure (HCC)   Essential hypertension   Hyperlipidemia    Cardiorenal syndrome -Patient with known h/o CHF and stage 3b/4 CKD -There has been recently worsening renal function and so diuretics were held and now she is significantly volume overloaded -Nephrology and cardiology consulted -She is on RA with vascular congestion on CXR without  frank edema -Will also consult palliative care   Cardiorenal syndrome - stage V CKD -Patient's renal dysfunction appears to be worsening and this makes diuresis potentially problemativ -Nephrology consult pending -Will admit to progressive care unit for close ongoing monitoring -May need HD catheter if she does not respond to diuretics with appropriate renal response -Continue Renvela   Cardiorenal syndrome - acute on chronic diastolic CHF -Patient with anasarca  -Echo was performed in 05/2022 with preserved EF and showed grade 1 diastolic dysfunction -Her torsemide was decreased and maybe stopped but resumed at a lower dose several weeks ago without improvement -Give 40 mg IV Lasix in the ER -Cardiology consult pending -Will add Lasix 60 mg IV BID for now  Hypothermia -Currently on Bair hugger -No current concern for sepsis -Likely thermodynamic instability in the setting of cardiorenal dysfunction -She also required a Bair hugger at the time of last admission    HLD -Continue Crestor   HTN -Hold carvedilol for now -Continue hydralazine   Ambulatory dysfunction -Reports she has not walked  in over a year -PT/OT consults        Advance Care Planning:   Code Status: Full Code - Code status was discussed with the patient and/or family at the time of admission.  The patient would want to receive full resuscitative measures at this time.   Consults: Nephrology; cardiology; palliative care  DVT Prophylaxis: Heparin SQ  Family Communication: Daughter was present throughout evaluation  Severity of Illness: The appropriate patient status for this patient is INPATIENT. Inpatient status is judged to be reasonable and necessary in order to provide the required intensity of service to ensure the patient's safety. The patient's presenting symptoms, physical exam findings, and initial radiographic and laboratory data in the context of their chronic comorbidities is felt to place them  at high risk for further clinical deterioration. Furthermore, it is not anticipated that the patient will be medically stable for discharge from the hospital within 2 midnights of admission.   * I certify that at the point of admission it is my clinical judgment that the patient will require inpatient hospital care spanning beyond 2 midnights from the point of admission due to high intensity of service, high risk for further deterioration and high frequency of surveillance required.*  Author: Jonah Blue, MD 02/25/2023 4:55 PM  For on call review [email or web address].

## 2023-02-25 NOTE — ED Triage Notes (Signed)
Pt BIBGEMS from home after having increased Sob. Pt reports feeling like she has extra fluid. Pt has edema to the left upper extremity, bilateral lower extremity. Per EMS lung bases sound like there is fluid.

## 2023-02-25 NOTE — Patient Outreach (Signed)
  Care Coordination   Follow Up Visit Note   02/25/2023 Name: Kara Mullins MRN: [id number] DOB: 1951/01/29  Kara Mullins is a 72 y.o. year old female who sees [name], [name], NP for primary care. I  reviewed patient's chart in preparation to contact patient for a nurse care coordination follow up call.   What matters to the patients health and wellness today?  Patient is currently in the ED at [facility].     Goals Addressed             This Visit's Progress    RN Care Coordination Activities: further follow up needed       Care Coordination Interventions: Reviewed chart in preparation to contact patient. Noted patient is currently at the [facility] Emergency Department at [facility] for shortness of breath           SDOH assessments and interventions completed:  No     Care Coordination Interventions:  Yes, provided   Follow up plan: Follow up call scheduled for 03/05/23 @10  AM    Encounter Outcome:  Pt. Visit Completed

## 2023-02-25 NOTE — ED Notes (Signed)
Bair hugger placed on patient, with warm blankets, and increased room temperature.

## 2023-02-25 NOTE — ED Notes (Signed)
Family at bedside. 

## 2023-02-25 NOTE — ED Notes (Signed)
Attempted to obtain oral temperature, still unable to obtain. MD made aware.

## 2023-02-25 NOTE — Consult Note (Signed)
Cardiology Consultation   Patient ID: Abril Needler MRN: [id number]; DOB: [birth year]  Admit date: 02/25/2023 Date of Consult: 02/25/2023  PCP:  Caesar Bookman, NP   [facility] Providers Cardiologist:  None        Patient Profile:   Kara Mullins is a 72 y.o. female with a hx of chronic combined systolic and diastolic heart failure, CKD stage IV, hypertension, lymphedema who is being seen 02/25/2023 for the evaluation of heart failure at the request of Dr. Ophelia Charter.  History of Present Illness:   Ms. Griesemer is a 72 year old female with a history of chronic combined systolic and diastolic heart failure, CKD stage IV, hypertension, lymphedema who we are consulted by Dr. Ophelia Charter for evaluation of heart failure.  She reports he has felt worse since recent blood transfusion.  Has been on p.o. torsemide but recently dose was decreased due to worsening renal function.  Had noticed worsening lower extremity edema and shortness of breath.  Denies any chest pain.  On presentation to the ED, initial vital signs notable for BP 186/96, pulse 68, SpO2 100% on room air.  Labs notable for creatinine 3.91 (increased from 3.0 on 01/22/2023 that was 4.3 10/2022), BNP 1037, troponin 23 > 38, hemoglobin 10.3.  Checks x-ray shows bilateral pleural effusions, left greater than right and cardiomegaly with vascular congestion.  EKG was not done.  She was given IV Lasix in ED, reports she feels improved.   Past Medical History:  Diagnosis Date   Chronic combined systolic and diastolic congestive heart failure (HCC) 11/09/2021   Generalized anxiety disorder    Hyperlipidemia 11/09/2021   Hypertension    Stage 3b chronic kidney disease (CKD) (HCC) 11/09/2021    Past Surgical History:  Procedure Laterality Date   CATARACT EXTRACTION, BILATERAL     combined systolic and diastolic congestive heart failure          Inpatient Medications: Scheduled Meds:  furosemide  60 mg Intravenous BID    heparin  5,000 Units Subcutaneous Q8H   hydrALAZINE  50 mg Oral Q8H   sevelamer carbonate  1,600 mg Oral TID WC   sodium chloride flush  3 mL Intravenous Q12H   Continuous Infusions:  PRN Meds: acetaminophen **OR** acetaminophen, albuterol, calcium carbonate (dosed in mg elemental calcium), camphor-menthol **AND** hydrOXYzine, docusate sodium, feeding supplement (NEPRO CARB STEADY), hydrALAZINE, ondansetron **OR** ondansetron (ZOFRAN) IV, sorbitol  Allergies:    Allergies  Allergen Reactions   Crestor [Rosuvastatin] Other (See Comments)    "Felt like she couldn't swallow"   Nitrofuran Derivatives     Social History:   Social History   Socioeconomic History   Marital status: Divorced    Spouse name: Not on file   Number of children: 1   Years of education: Not on file   Highest education level: Not on file  Occupational History   Occupation: retired  Tobacco Use   Smoking status: Never   Smokeless tobacco: Never  Vaping Use   Vaping Use: Never used  Substance and Sexual Activity   Alcohol use: Not Currently   Drug use: Never   Sexual activity: Not Currently  Other Topics Concern   Not on file  Social History Narrative   ** Merged History Encounter **       Tobacco use, amount per day now: N/A Past tobacco use, amount per day: N/A How many years did you use tobacco: N/A Alcohol use (drinks per week): N/A Diet: Do you drink/eat things with caffeine: Coffee  Marital status: Divorced                                    What year were you married? 1992 Do you live in a house, apartment, assisted living, condo, trailer, etc.? Is it one or more stories? How many persons live in your home? 2 Do you have pets in your home?( please list) No Highest Level of e   ducation completed? High School Current or past profession: Restaurant Employee Do you exercise?   Yes                               Type and how often? Walk Do you have a living will? No Do you have a DNR form?     No                               If    not, do you want to discuss one? Do you have signed POA/HPOA forms? No                       If so, please bring to you appointment  Do you have any difficulty bathing or dressing yourself? No Do you have any difficulty preparing food or eating? No    Do you have any difficulty managing your medications? No Do you have any difficulty managing your finances? No Do you have any difficulty affording your medications? No   Social Determinants of Health   Financial Resource Strain: Low Risk  (06/11/2022)   Overall Financial Resource Strain (CARDIA)    Difficulty of Paying Living Expenses: Not very hard  Food Insecurity: No Food Insecurity (06/19/2022)   Hunger Vital Sign    Worried About Running Out of Food in the Last Year: Never true    Ran Out of Food in the Last Year: Never true  Transportation Needs: No Transportation Needs (11/22/2022)   PRAPARE - Administrator, Civil Service (Medical): No    Lack of Transportation (Non-Medical): No  Physical Activity: Not on file  Stress: Not on file  Social Connections: Not on file  Intimate Partner Violence: Not on file    Family History:    Family History  Problem Relation Age of Onset   Throat cancer Father      ROS:  Please see the history of present illness.   All other ROS reviewed and negative.     Physical Exam/Data:   Vitals:   02/25/23 1415 02/25/23 1445 02/25/23 1608 02/25/23 1706  BP: (!) 162/89 (!) 161/79  (!) 154/68  Pulse: 60 62  65  Resp: 10 13  11   Temp:   (!) 97.4 F (36.3 C) 97.7 F (36.5 C)  TempSrc:   Oral Oral  SpO2: 100% 98%  98%  Weight:    84.5 kg  Height:    5\' 5"  (1.651 m)   No intake or output data in the 24 hours ending 02/25/23 1802    02/25/2023    5:06 PM 08/18/2022    5:00 AM 08/17/2022    5:24 AM  Last 3 Weights  Weight (lbs) 186 lb 4.6 oz 186 lb 4.6 oz 184 lb 15.5 oz  Weight (kg) 84.5 kg 84.5 kg 83.9 kg     Body  mass index is 31 kg/m.   General:   in no acute distress HEENT: normal Neck: + JVD Cardiac:  normal S1, S2; RRR; 2/6 systolic murmur Lungs: Diminished breath sounds Abd: soft, nontende Ext: 2+ BLE edema Musculoskeletal:  No deformities Skin: warm and dry  Neuro:   no focal abnormalities noted Psych:  Normal affect   EKG:  The EKG was personally reviewed and demonstrates: No  EKG Telemetry:  Telemetry was personally reviewed and demonstrates: Normal sinus rhythm  Relevant CV Studies:   Laboratory Data:  High Sensitivity Troponin:   Recent Labs  Lab 02/25/23 1226 02/25/23 1403  TROPONINIHS 23* 38*     Chemistry Recent Labs  Lab 02/25/23 1226  NA 137  K 3.5  CL 108  CO2 18*  GLUCOSE 89  BUN 79*  CREATININE 3.91*  CALCIUM 7.2*  GFRNONAA 12*  ANIONGAP 11    No results for input(s): "PROT", "ALBUMIN", "AST", "ALT", "ALKPHOS", "BILITOT" in the last 168 hours. Lipids No results for input(s): "CHOL", "TRIG", "HDL", "LABVLDL", "LDLCALC", "CHOLHDL" in the last 168 hours.  Hematology Recent Labs  Lab 02/25/23 1226  WBC 4.1  RBC 3.61*  HGB 10.3*  HCT 34.0*  MCV 94.2  MCH 28.5  MCHC 30.3  RDW 17.6*  PLT 192   Thyroid No results for input(s): "TSH", "FREET4" in the last 168 hours.  BNP Recent Labs  Lab 02/25/23 1226  BNP 1,037.5*    DDimer No results for input(s): "DDIMER" in the last 168 hours.   Radiology/Studies:  DG Chest Portable 1 View  Result Date: 02/25/2023 CLINICAL DATA:  Shortness of breath EXAM: PORTABLE CHEST 1 VIEW COMPARISON:  X-ray 08/18/2022 FINDINGS: Overlapping cardiac leads. Enlarged cardiopericardial silhouette with calcified tortuous aorta. Persistent bilateral pleural effusions, left-greater-than-right. The right effusion is slightly larger today. No pneumothorax. Central vascular congestion. Curvature of the spine with degenerative changes. Film is rotated to the left. Osteopenia IMPRESSION: Bilateral pleural effusions, left-greater-than-right. The right  effusion appears slightly larger today. Enlarged heart with vascular congestion. Electronically Signed   By: Karen Kays M.D.   On: 02/25/2023 12:45     Assessment and Plan:   Acute on chronic diastolic heart failure: Last echo 05/2022 showed EF 55% normal RV function, moderately elevated pulmonary pressures, moderate left atrial enlargement.  Presented with shortness of breath and worsening lower extremity edema.  Appears volume overloaded on exam -Update echocardiogram -Agree with nephrology consult, would defer diuresis to nephrology given tenuous renal function  CKD stage IV-V: Creatinine 3.9, has varied from 3.0-6.5 over the last 6 months.  Will need to closely monitor renal function with diuresis.  Nephrology consulted.  Hypertension: On hydralazine 50 mg 3 times daily   For questions or updates, please contact [facility] Please consult [email or web address] for contact info under    Signed, [name], MD  02/25/2023 6:02 PM

## 2023-02-26 ENCOUNTER — Inpatient Hospital Stay (HOSPITAL_COMMUNITY): Payer: Medicare (Managed Care)

## 2023-02-26 DIAGNOSIS — I5033 Acute on chronic diastolic (congestive) heart failure: Secondary | ICD-10-CM | POA: Diagnosis not present

## 2023-02-26 DIAGNOSIS — N184 Chronic kidney disease, stage 4 (severe): Secondary | ICD-10-CM | POA: Diagnosis not present

## 2023-02-26 DIAGNOSIS — I5031 Acute diastolic (congestive) heart failure: Secondary | ICD-10-CM

## 2023-02-26 DIAGNOSIS — I10 Essential (primary) hypertension: Secondary | ICD-10-CM | POA: Diagnosis not present

## 2023-02-26 LAB — ECHOCARDIOGRAM COMPLETE
AR max vel: 2.34 cm2
AV Area VTI: 1.92 cm2
AV Area mean vel: 2.06 cm2
AV Mean grad: 4 mmHg
AV Peak grad: 6.9 mmHg
Ao pk vel: 1.31 m/s
Area-P 1/2: 3.6 cm2
Calc EF: 45.9 %
Height: 65 in
P 1/2 time: 437 msec
S' Lateral: 3.2 cm
Single Plane A2C EF: 46 %
Single Plane A4C EF: 49.8 %
Weight: 2962.98 oz

## 2023-02-26 LAB — MAGNESIUM: Magnesium: 2.8 mg/dL — ABNORMAL HIGH (ref 1.7–2.4)

## 2023-02-26 LAB — CBC
HCT: 28.1 % — ABNORMAL LOW (ref 36.0–46.0)
Hemoglobin: 8.8 g/dL — ABNORMAL LOW (ref 12.0–15.0)
MCH: 28.4 pg (ref 26.0–34.0)
MCHC: 31.3 g/dL (ref 30.0–36.0)
MCV: 90.6 fL (ref 80.0–100.0)
Platelets: 183 10*3/uL (ref 150–400)
RBC: 3.1 MIL/uL — ABNORMAL LOW (ref 3.87–5.11)
RDW: 17.4 % — ABNORMAL HIGH (ref 11.5–15.5)
WBC: 4 10*3/uL (ref 4.0–10.5)
nRBC: 0 % (ref 0.0–0.2)

## 2023-02-26 LAB — BASIC METABOLIC PANEL
Anion gap: 10 (ref 5–15)
BUN: 79 mg/dL — ABNORMAL HIGH (ref 8–23)
CO2: 19 mmol/L — ABNORMAL LOW (ref 22–32)
Calcium: 7.1 mg/dL — ABNORMAL LOW (ref 8.9–10.3)
Chloride: 111 mmol/L (ref 98–111)
Creatinine, Ser: 3.94 mg/dL — ABNORMAL HIGH (ref 0.44–1.00)
GFR, Estimated: 12 mL/min — ABNORMAL LOW (ref >60)
Glucose, Bld: 88 mg/dL (ref 70–99)
Potassium: 3.4 mmol/L — ABNORMAL LOW (ref 3.5–5.1)
Sodium: 140 mmol/L (ref 135–145)

## 2023-02-26 MED ORDER — SODIUM BICARBONATE 650 MG PO TABS
650.0000 mg | ORAL_TABLET | Freq: Two times a day (BID) | ORAL | Status: DC
  Administered 2023-02-26 – 2023-03-03 (×11): 650 mg via ORAL
  Filled 2023-02-26 (×11): qty 1

## 2023-02-26 MED ORDER — POTASSIUM CHLORIDE CRYS ER 20 MEQ PO TBCR
20.0000 meq | EXTENDED_RELEASE_TABLET | Freq: Every day | ORAL | Status: DC
  Administered 2023-02-26 – 2023-02-28 (×3): 20 meq via ORAL
  Filled 2023-02-26 (×3): qty 1

## 2023-02-26 MED ORDER — HYDROCORTISONE 1 % EX CREA: TOPICAL_CREAM | Freq: Two times a day (BID) | CUTANEOUS | Status: DC | PRN

## 2023-02-26 MED ORDER — CARVEDILOL 3.125 MG PO TABS
3.1250 mg | ORAL_TABLET | Freq: Two times a day (BID) | ORAL | Status: DC
  Administered 2023-02-26 – 2023-03-03 (×9): 3.125 mg via ORAL
  Filled 2023-02-26 (×10): qty 1

## 2023-02-26 MED ORDER — FUROSEMIDE 10 MG/ML IJ SOLN
80.0000 mg | Freq: Three times a day (TID) | INTRAMUSCULAR | Status: DC
  Administered 2023-02-26 – 2023-03-01 (×11): 80 mg via INTRAVENOUS
  Filled 2023-02-26 (×11): qty 8

## 2023-02-26 MED ORDER — CAMPHOR-MENTHOL 0.5-0.5 % EX LOTN
TOPICAL_LOTION | CUTANEOUS | Status: DC | PRN
  Filled 2023-02-26: qty 222

## 2023-02-26 NOTE — Progress Notes (Signed)
Echocardiogram 2D Echocardiogram has been performed.  Kara Mullins 02/26/2023, 3:51 PM

## 2023-02-26 NOTE — Progress Notes (Signed)
   02/26/23 1626  Vitals  Temp (!) 94 F (34.4 C) (Rectal. RRT called)  Temp Source Rectal  BP (!) 149/78  MAP (mmHg) 93  BP Location Right Arm  BP Method Automatic  Pulse Rate (!) 58  ECG Heart Rate (!) 58  Resp 10  Oxygen Therapy  SpO2 99 %   RRT called after taKing rectal temp. Bear hugger applied. Doctor called as well. RRT would like a rectal recheck in an hour. New new orders from Dr. Dayna Barker

## 2023-02-26 NOTE — TOC Initial Note (Signed)
Transition of Care [facility]) - Initial/Assessment Note    Patient Details  Name: Kara Mullins MRN: [id number] Date of Birth: 10-Feb-1951  Transition of Care [facility]) CM/SW Contact:    Gala Lewandowsky, RN Phone Number: 02/26/2023, 2:53 PM  Clinical Narrative: Risk for readmission assessment completed. PTA patient was from home with daughter [name]. Case Manager spoke with [name] and she states her and her spouse alternates care for the patient. Patient is currently active with [facility] for RN and PT services-patient will need resumption orders and F2F.  Patient has DME cane, rolling walker, hospital bed, and wheelchair in the home that she uses. Case Manager will continue to follow for transition of care needs as the patient progresses.              Expected Discharge Plan: Home w Home Health Services Barriers to Discharge: Continued Medical Work up  Expected Discharge Plan and Services In-house Referral: NA Discharge Planning Services: CM Consult Post Acute Care Choice: Home Health, Resumption of Svcs/PTA Provider Living arrangements for the past 2 months: Single Family Home                   DME Agency: NA     HH Agency: [facility]) Date HH Agency Contacted: 02/26/23 Time HH Agency Contacted: 1030 Representative spoke with at [facility] Agency: [name]  Prior Living Arrangements/Services Living arrangements for the past 2 months: Single Family Home Lives with:: Adult Children, Relatives Patient language and need for interpreter reviewed:: Yes Do you feel safe going back to the place where you live?: Yes      Need for Family Participation in Patient Care: Yes (Comment) Care giver support system in place?: Yes (comment) Current home services: Home RN, Home PT, DME (rolling walker, cane, hospital bed, wheelchair,) Criminal Activity/Legal Involvement Pertinent to Current Situation/Hospitalization: No - Comment as needed  Activities of Daily Living Home  Assistive Devices/Equipment: None ADL Screening (condition at time of admission) Patient's cognitive ability adequate to safely complete daily activities?: Yes Is the patient deaf or have difficulty hearing?: No Does the patient have difficulty seeing, even when wearing glasses/contacts?: No Does the patient have difficulty concentrating, remembering, or making decisions?: No Patient able to express need for assistance with ADLs?: Yes Does the patient have difficulty dressing or bathing?: No Independently performs ADLs?: Yes (appropriate for developmental age) Does the patient have difficulty walking or climbing stairs?: Yes Weakness of Legs: Both Weakness of Arms/Hands: None  Permission Sought/Granted Permission sought to share information with : Family Supports, Case Production designer, theatre/television/film, Photographer granted to share info w AGENCY: [facility]        Emotional Assessment Appearance:: Appears stated age       Alcohol / Substance Use: Not Applicable Psych Involvement: No (comment)  Admission diagnosis:  Cardiorenal syndrome [I13.10] Acute on chronic congestive heart failure, unspecified heart failure type [facility]) [I50.9] Patient Active Problem List   Diagnosis Date Noted   Acute on chronic diastolic CHF (congestive heart failure) (HCC) 02/26/2023   Cardiorenal syndrome 02/25/2023   Chronic kidney disease (CKD), stage IV (severe) (HCC) 02/25/2023   Hypoglycemia 08/15/2022   Hypertensive emergency 08/15/2022   Obesity (BMI 30-39.9) 08/15/2022   Acute on chronic diastolic heart failure (HCC) 08/14/2022   AKI (acute kidney injury) (HCC)    (HFpEF) heart failure with preserved ejection fraction (HCC) 06/02/2022   Acute respiratory distress 06/02/2022   Bilateral lower extremity edema 06/02/2022   Hypocalcemia  06/02/2022   Subclinical hypothyroidism 06/02/2022   Transaminitis 06/02/2022   Acute kidney injury superimposed on chronic kidney disease (HCC)  03/28/2022   Anasarca associated with disorder of kidney 03/27/2022   Leukocytopenia 03/27/2022   Aortic atherosclerosis (HCC) 03/27/2022   Pressure injury of skin 01/02/2022   Hypotension, unspecified 12/30/2021   Pneumonia due to COVID-19 virus 12/26/2021   Palliative care by specialist    Goals of care, counseling/discussion    General weakness    Sepsis (HCC) 12/22/2021   COVID-19 virus infection 12/22/2021   Paroxysmal atrial fibrillation (HCC) 12/22/2021   CKD (chronic kidney disease) stage 5, GFR less than 15 ml/min (HCC) 12/22/2021   Normocytic anemia 12/22/2021   Prolonged QT interval 12/22/2021   Debility 11/20/2021   Metabolic acidosis 11/20/2021   Severe sepsis (HCC) 11/19/2021   Hypothermia 11/09/2021   Hyperlipidemia 11/09/2021   Chronic combined systolic and diastolic congestive heart failure (HCC) 11/09/2021   Pancytopenia (HCC) 11/09/2021   Severe protein-calorie malnutrition (HCC) 11/09/2021   Acute combined systolic and diastolic HF (heart failure) (HCC)    CHF exacerbation (HCC) 03/06/2021   Essential hypertension    Hypertensive urgency 03/05/2021   Acute CHF (congestive heart failure) (HCC) 03/05/2021   Nausea and vomiting 03/05/2021   Elevated troponin level not due myocardial infarction 03/05/2021   Elevated d-dimer 03/05/2021   Generalized anxiety disorder 03/05/2021   Acute congestive heart failure (HCC) 03/05/2021   PCP:  Caesar Bookman, NP Pharmacy:   [id number] [place], [place] - [address] AT [facility] & [address] [place] [address] Phone: [phone] Fax: [phone]  [id number] - [place], [place] - [address] AT [facility] OF [address] [place] [address] Phone: [phone] Fax: [phone]  [id number] - [place], [place] - [address] AT [facility] OF [address] & [address] [place]  [address] Phone: [phone] Fax: [phone]  Social Determinants of Health (SDOH) Social History: SDOH Screenings   Food Insecurity: No Food Insecurity (02/26/2023)  Housing: Low Risk  (02/26/2023)  Transportation Needs: No Transportation Needs (02/26/2023)  Utilities: Not At Risk (02/26/2023)  Alcohol Screen: Low Risk  (06/11/2022)  Depression (PHQ2-9): Low Risk  (01/27/2023)  Financial Resource Strain: Low Risk  (06/11/2022)  Tobacco Use: Low Risk  (02/25/2023)   Readmission Risk Interventions    02/26/2023    2:53 PM 06/17/2022    3:36 PM 06/17/2022    3:34 PM  Readmission Risk Prevention Plan  Transportation Screening Complete  Complete  HRI or Home Care Consult Complete    Social Work Consult for Recovery Care Planning/Counseling Complete    Palliative Care Screening Not Applicable    Medication Review Oceanographer) Referral to Pharmacy Complete Complete  PCP or Specialist appointment within 3-5 days of discharge  Complete Complete  HRI or Home Care Consult  Complete   SW Recovery Care/Counseling Consult  Complete   Palliative Care Screening  Not Applicable   Skilled Nursing Facility  Not Applicable

## 2023-02-26 NOTE — Hospital Course (Addendum)
71 yof w/ chronic diastolic CHF w/ lvef 55%,GAD, HTN, HLD,Obseity, lymphedema and stage IV b/l bun/creat 60/3.0 (01/22/23) previously in 90s/5 presented with shortness of breath, worsening edema. She went in for Retacrit injection on 3/14 and was given blood transfusion.  Her fluid balance has seemed off since.  She had been on torsemide but the dose was decreased because her renal function worsened.  She restarted the torsemide at 40 mg BID (instead of 80) but  her edema has worsened and her daughter noticed pitting edema in her flanks In the ED hypertensive 180-190, labs showed BNP 1037 troponin 23>, hemoglobin 10.3 g, BUN 79 creatinine 3.9. Chest x-ray bilateral pleural effusion left greater than right right effusion appears slightly larger compared to x-ray 08/18/2022. Patient was given IV Lasix and admitted for acute on chronic CHF-cardiology and nephrology consulted.  Patient manage is a high dose IV Lasix. Nephrology signed off> difficult to get swelling improved since she is non ambulatory and advised to assess volume status with orthopnea and shortness of breath which is improving. Cardiology following closely IV Lasix changed to torsemide 5/19, daughter would like her to monitor next 24 hours on oral torsemide to make sure patient is tolerating and symptoms better prior to discharge

## 2023-02-26 NOTE — Progress Notes (Signed)
Rounding Note    Patient Name: Kara Mullins Date of Encounter: 02/26/2023  [facility] Cardiologist: None   Subjective   I/Os incomplete.  Weight down 1 lb.  BP 116/66.  Cr stable (3.91 >3.94).  Reports dyspnea improved but continues to have shortness of breath  Inpatient Medications    Scheduled Meds:  furosemide  60 mg Intravenous BID   heparin  5,000 Units Subcutaneous Q8H   hydrALAZINE  50 mg Oral Q8H   potassium chloride  20 mEq Oral Daily   sevelamer carbonate  1,600 mg Oral TID WC   sodium bicarbonate  650 mg Oral BID   sodium chloride flush  3 mL Intravenous Q12H   Continuous Infusions:  PRN Meds: acetaminophen **OR** acetaminophen, albuterol, calcium carbonate (dosed in mg elemental calcium), camphor-menthol **AND** hydrOXYzine, docusate sodium, feeding supplement (NEPRO CARB STEADY), hydrALAZINE, ondansetron **OR** ondansetron (ZOFRAN) IV, sorbitol   Vital Signs    Vitals:   02/26/23 0425 02/26/23 0730 02/26/23 0736 02/26/23 0815  BP: (!) 150/71 (!) 145/69 (!) 149/53 116/66  Pulse: 62 72 73 72  Resp: 13 13 18 14   Temp: (!) 97.2 F (36.2 C) 97.8 F (36.6 C)  97.8 F (36.6 C)  TempSrc: Oral Oral Oral Oral  SpO2: 98% 97% 97% 97%  Weight: 84 kg     Height:        Intake/Output Summary (Last 24 hours) at 02/26/2023 1019 Last data filed at 02/26/2023 0859 Gross per 24 hour  Intake 120 ml  Output 1150 ml  Net -1030 ml      02/26/2023    4:25 AM 02/25/2023    5:06 PM 08/18/2022    5:00 AM  Last 3 Weights  Weight (lbs) 185 lb 3 oz 186 lb 4.6 oz 186 lb 4.6 oz  Weight (kg) 84 kg 84.5 kg 84.5 kg      Telemetry    NSR - Personally Reviewed  ECG    No new ECG - Personally Reviewed  Physical Exam   GEN: No acute distress.   Neck: + JVD Cardiac: RRR, no murmurs, rubs, or gallops.  Respiratory: Clear to auscultation bilaterally. GI: Soft, nontender, non-distended  MS: 2+ BLE edema Neuro:  Nonfocal  Psych: Normal affect   Labs     High Sensitivity Troponin:   Recent Labs  Lab 02/25/23 1226 02/25/23 1403  TROPONINIHS 23* 38*     Chemistry Recent Labs  Lab 02/25/23 1226 02/26/23 0108  NA 137 140  K 3.5 3.4*  CL 108 111  CO2 18* 19*  GLUCOSE 89 88  BUN 79* 79*  CREATININE 3.91* 3.94*  CALCIUM 7.2* 7.1*  MG  --  2.8*  GFRNONAA 12* 12*  ANIONGAP 11 10    Lipids No results for input(s): "CHOL", "TRIG", "HDL", "LABVLDL", "LDLCALC", "CHOLHDL" in the last 168 hours.  Hematology Recent Labs  Lab 02/25/23 1226 02/26/23 0108  WBC 4.1 4.0  RBC 3.61* 3.10*  HGB 10.3* 8.8*  HCT 34.0* 28.1*  MCV 94.2 90.6  MCH 28.5 28.4  MCHC 30.3 31.3  RDW 17.6* 17.4*  PLT 192 183   Thyroid No results for input(s): "TSH", "FREET4" in the last 168 hours.  BNP Recent Labs  Lab 02/25/23 1226  BNP 1,037.5*    DDimer No results for input(s): "DDIMER" in the last 168 hours.   Radiology    DG Chest Portable 1 View  Result Date: 02/25/2023 CLINICAL DATA:  Shortness of breath EXAM: PORTABLE CHEST 1 VIEW COMPARISON:  X-ray 08/18/2022 FINDINGS: Overlapping cardiac leads. Enlarged cardiopericardial silhouette with calcified tortuous aorta. Persistent bilateral pleural effusions, left-greater-than-right. The right effusion is slightly larger today. No pneumothorax. Central vascular congestion. Curvature of the spine with degenerative changes. Film is rotated to the left. Osteopenia IMPRESSION: Bilateral pleural effusions, left-greater-than-right. The right effusion appears slightly larger today. Enlarged heart with vascular congestion. Electronically Signed   By: Karen Kays M.D.   On: 02/25/2023 12:45    Cardiac Studies     Patient Profile     72 y.o. female with a hx of chronic combined systolic and diastolic heart failure, CKD stage IV, hypertension, lymphedema who is being seen 02/25/2023 for the evaluation of heart failure   Assessment & Plan    Acute on chronic diastolic heart failure: Last echo 05/2022 showed EF  55% normal RV function, moderately elevated pulmonary pressures, moderate left atrial enlargement.  Presented with shortness of breath and worsening lower extremity edema.  Appears volume overloaded on exam -Update echocardiogram -Continue IV lasix. Agree with nephrology consult, would defer diuresis to nephrology given tenuous renal function   CKD stage IV-V: Creatinine 3.9, has varied from 3.0-6.5 over the last 6 months.  Will need to closely monitor renal function with diuresis.  Nephrology consulted.   Hypertension: On hydralazine 50 mg 3 times daily.  Appears controlled  For questions or updates, please contact [facility] Please consult [email or web address] for contact info under        Signed, [name], MD  02/26/2023, 10:19 AM

## 2023-02-26 NOTE — Consult Note (Signed)
   [facility] CM Inpatient Consult   02/26/2023  Leonor Mcneff 08-06-1951 [id number]  [facility] [[facility]]  Accountable Care Organization [ACO] Patient: [employer]age  Primary Care Provider:  Caesar Bookman, NP with [facility]   Patient is currently active with [facility] [[facility]] Care Management for chronic disease management services.  Patient has been engaged by a [facility].  Our community based plan of care has focused on disease management and community resource support.  Review of patient's electronic medical record encounters reveals patient's active [facility] RNCC, Angel Little,  is aware of hospitalization.  *Patient on contact precaution  Spoke with patient up in recliner, she states her daughter is expected to come to the hospital later today.    1550 Returned to speak to daughter if available and no family at bedside, patient was asleep..  Plan: Continue to follow for progress and any additional post hospital follow up needs with [facility] RN CC. Will follow up with  Inpatient Transition Of Care [TOC] team member to make aware that [facility] Care Management following.   Of note, [facility] Care Management services does not replace or interfere with any services that are needed or arranged by inpatient [facility] care management team.   For additional questions or referrals please contact:  Charlesetta Shanks, RN BSN CCM [facility]  [phone] business mobile phone Toll free office [phone]  *Concierge Line  [phone] Fax number: [phone] [email or web address]

## 2023-02-26 NOTE — Consult Note (Signed)
[facility]  HISTORY AND PHYSICAL  Kara Mullins is an 72 y.o. female.    Chief Complaint: swelling and SOB  HPI: PT is a 72F with a PMH sig for CKD V, anemia on retacrit, HTN, HLD, and CHF who is now seen in consultation at the request of Dr Jonathon Bellows for evaluation and recommendations surrounding CKD.  Pt has noted increased LE edema and fluid buildup for the past several weeks.  She noted this since her blood transfusion at [facility] 12/26/22.  She was on torsemide only as a prn but restarted it last week.  This didn't help and she is now admitted for CHF exacerbation.    Cr is 3.9.  Review of records indicates that Cr is ranging anywhere from 3.9 to over 5.  She is normally in the mid-4s.    In this setting we are asked to see.  She says that her SOB is better.  Not on O2.  Still has some LE edema.  She has been managed with nondialytic intervention.    PMH: Past Medical History:  Diagnosis Date   Chronic combined systolic and diastolic congestive heart failure (HCC) 11/09/2021   Generalized anxiety disorder    Hyperlipidemia 11/09/2021   Hypertension    Stage 3b chronic kidney disease (CKD) (HCC) 11/09/2021   PSH: Past Surgical History:  Procedure Laterality Date   CATARACT EXTRACTION, BILATERAL     combined systolic and diastolic congestive heart failure      Past Medical History:  Diagnosis Date   Chronic combined systolic and diastolic congestive heart failure (HCC) 11/09/2021   Generalized anxiety disorder    Hyperlipidemia 11/09/2021   Hypertension    Stage 3b chronic kidney disease (CKD) (HCC) 11/09/2021    Medications:  Scheduled:  carvedilol  3.125 mg Oral BID WC   furosemide  80 mg Intravenous TID   heparin  5,000 Units Subcutaneous Q8H   hydrALAZINE  50 mg Oral Q8H   potassium chloride  20 mEq Oral Daily   sevelamer carbonate  1,600 mg Oral TID WC   sodium bicarbonate  650 mg Oral BID   sodium chloride flush  3 mL Intravenous Q12H    Medications Prior  to Admission  Medication Sig Dispense Refill   carvedilol (COREG) 3.125 MG tablet Take 3.125 mg by mouth 2 (two) times daily with a meal.     cholecalciferol (VITAMIN D3) 25 MCG (1000 UNIT) tablet Take 1,000 Units by mouth daily.     Epoetin Alfa-epbx (RETACRIT IJ) Inject 20,000 Units as directed every 14 (fourteen) days.     feeding supplement (ENSURE ENLIVE / ENSURE PLUS) LIQD Take 237 mLs by mouth 2 (two) times daily between meals. 237 mL 12   hydrALAZINE (APRESOLINE) 50 MG tablet TAKE 1 TABLET(50 MG) BY MOUTH EVERY 8 HOURS (Patient taking differently: Take 25 mg by mouth in the morning and at bedtime.) 270 tablet 1   Menthol-Methyl Salicylate (MUSCLE RUB) 10-15 % CREA Apply 1 Application topically daily as needed for muscle pain.     Multiple Vitamin (MULTIVITAMIN ADULT PO) Take 1 tablet by mouth daily.     sevelamer carbonate (RENVELA) 800 MG tablet Take 800 mg by mouth every evening.     sodium bicarbonate 650 MG tablet Take 650 mg by mouth 2 (two) times daily.     Torsemide 40 MG TABS Take 1 tablet by mouth daily.      ALLERGIES:   Allergies  Allergen Reactions   Crestor [Rosuvastatin] Other (See  Comments)    "Felt like she couldn't swallow"   Nitrofuran Derivatives     FAM HX: Family History  Problem Relation Age of Onset   Throat cancer Father     Social History:   reports that she has never smoked. She has never used smokeless tobacco. She reports that she does not currently use alcohol. She reports that she does not use drugs.  ROS: ROS: all other systems reviewed and are negative except as per HPI  Blood pressure (!) 174/79, pulse 65, temperature 97.6 F (36.4 C), temperature source Axillary, resp. rate 15, height 5\' 5"  (1.651 m), weight 84 kg, SpO2 98 %. PHYSICAL EXAM: Physical Exam GEN  sitting in chair NAD HEENT sclerae anicteric NECK  no JVD PULM clear CV RR ABD soft EXT 3+ pitting edema bilaterally NEURO AAO x 3 nonfocal   Results for orders placed or  performed during the hospital encounter of 02/25/23 (from the past 48 hour(s))  CBC with Differential     Status: Abnormal   Collection Time: 02/25/23 12:26 PM  Result Value Ref Range   WBC 4.1 4.0 - 10.5 K/uL   RBC 3.61 (L) 3.87 - 5.11 MIL/uL   Hemoglobin 10.3 (L) 12.0 - 15.0 g/dL   HCT 16.1 (L) 09.6 - 04.5 %   MCV 94.2 80.0 - 100.0 fL   MCH 28.5 26.0 - 34.0 pg   MCHC 30.3 30.0 - 36.0 g/dL   RDW 40.9 (H) 81.1 - 91.4 %   Platelets 192 150 - 400 K/uL   nRBC 0.0 0.0 - 0.2 %   Neutrophils Relative % 72 %   Neutro Abs 3.0 1.7 - 7.7 K/uL   Lymphocytes Relative 19 %   Lymphs Abs 0.8 0.7 - 4.0 K/uL   Monocytes Relative 5 %   Monocytes Absolute 0.2 0.1 - 1.0 K/uL   Eosinophils Relative 3 %   Eosinophils Absolute 0.1 0.0 - 0.5 K/uL   Basophils Relative 1 %   Basophils Absolute 0.0 0.0 - 0.1 K/uL   Immature Granulocytes 0 %   Abs Immature Granulocytes 0.01 0.00 - 0.07 K/uL    Comment: Performed at [facility], [address] [place], [place] [address]  Basic metabolic panel     Status: Abnormal   Collection Time: 02/25/23 12:26 PM  Result Value Ref Range   Sodium 137 135 - 145 mmol/L   Potassium 3.5 3.5 - 5.1 mmol/L   Chloride 108 98 - 111 mmol/L   CO2 18 (L) 22 - 32 mmol/L   Glucose, Bld 89 70 - 99 mg/dL    Comment: Glucose reference range applies only to samples taken after fasting for at least 8 hours.   BUN 79 (H) 8 - 23 mg/dL   Creatinine, Ser 6.21 (H) 0.44 - 1.00 mg/dL   Calcium 7.2 (L) 8.9 - 10.3 mg/dL   GFR, Estimated 12 (L) >60 mL/min    Comment: (NOTE) Calculated using the CKD-EPI Creatinine Equation (2021)    Anion gap 11 5 - 15    Comment: Performed at [facility], [address], [place], [place] [address]  Brain natriuretic peptide     Status: Abnormal   Collection Time: 02/25/23 12:26 PM  Result Value Ref Range   B Natriuretic Peptide 1,037.5 (H) 0.0 - 100.0 pg/mL    Comment: Performed at [facility], [address], [place], [place]  [address]  Troponin I (High Sensitivity)     Status: Abnormal  Collection Time: 02/25/23 12:26 PM  Result Value Ref Range   Troponin I (High Sensitivity) 23 (H) <18 ng/L    Comment: (NOTE) Elevated high sensitivity troponin I (hsTnI) values and significant  changes across serial measurements may suggest ACS but many other  chronic and acute conditions are known to elevate hsTnI results.  Refer to the "Links" section for chest pain algorithms and additional  guidance. Performed at [facility], [address], [place], [place] [address]   Troponin I (High Sensitivity)     Status: Abnormal   Collection Time: 02/25/23  2:03 PM  Result Value Ref Range   Troponin I (High Sensitivity) 38 (H) <18 ng/L    Comment: (NOTE) Elevated high sensitivity troponin I (hsTnI) values and significant  changes across serial measurements may suggest ACS but many other  chronic and acute conditions are known to elevate hsTnI results.  Refer to the "Links" section for chest pain algorithms and additional  guidance. Performed at [facility], [address] [place], [place] [address]   Basic metabolic panel     Status: Abnormal   Collection Time: 02/26/23  1:08 AM  Result Value Ref Range   Sodium 140 135 - 145 mmol/L   Potassium 3.4 (L) 3.5 - 5.1 mmol/L   Chloride 111 98 - 111 mmol/L   CO2 19 (L) 22 - 32 mmol/L   Glucose, Bld 88 70 - 99 mg/dL    Comment: Glucose reference range applies only to samples taken after fasting for at least 8 hours.   BUN 79 (H) 8 - 23 mg/dL   Creatinine, Ser 0.98 (H) 0.44 - 1.00 mg/dL   Calcium 7.1 (L) 8.9 - 10.3 mg/dL   GFR, Estimated 12 (L) >60 mL/min    Comment: (NOTE) Calculated using the CKD-EPI Creatinine Equation (2021)    Anion gap 10 5 - 15    Comment: Performed at [facility], [address], [place], [place] [address]  CBC     Status: Abnormal   Collection Time: 02/26/23  1:08 AM  Result Value Ref Range   WBC 4.0 4.0 - 10.5 K/uL   RBC  3.10 (L) 3.87 - 5.11 MIL/uL   Hemoglobin 8.8 (L) 12.0 - 15.0 g/dL   HCT 78.2 (L) 95.6 - 21.3 %   MCV 90.6 80.0 - 100.0 fL   MCH 28.4 26.0 - 34.0 pg   MCHC 31.3 30.0 - 36.0 g/dL   RDW 08.6 (H) 57.8 - 46.9 %   Platelets 183 150 - 400 K/uL   nRBC 0.0 0.0 - 0.2 %    Comment: Performed at [facility], [address], [place], [place] [address]  Magnesium     Status: Abnormal   Collection Time: 02/26/23  1:08 AM  Result Value Ref Range   Magnesium 2.8 (H) 1.7 - 2.4 mg/dL    Comment: Performed at [facility], [address], [place], [place] [address]    DG Chest Portable 1 View  Result Date: 02/25/2023 CLINICAL DATA:  Shortness of breath EXAM: PORTABLE CHEST 1 VIEW COMPARISON:  X-ray 08/18/2022 FINDINGS: Overlapping cardiac leads. Enlarged cardiopericardial silhouette with calcified tortuous aorta. Persistent bilateral pleural effusions, left-greater-than-right. The right effusion is slightly larger today. No pneumothorax. Central vascular congestion. Curvature of the spine with degenerative changes. Film is rotated to the left. Osteopenia IMPRESSION: Bilateral pleural effusions, left-greater-than-right. The right effusion appears slightly larger today. Enlarged heart with vascular congestion. Electronically Signed  By: Karen Kays M.D.   On: 02/25/2023 12:45    Assessment/Plan  CKD V: at baseline Cr, maybe a little better, in the setting of volume overload  - will increase Lasix to 80 IV TID and follow  - BP is generous to allow for that  - follow closely  - nondialytic intervention  2.  Acute on chronic CHF exac:   - cardiology following  3.  Anemia:  - Hgb 10.3, continue to follow  4.  Dispo: pending  Bufford Buttner 02/26/2023, 1:22 PM

## 2023-02-26 NOTE — Evaluation (Signed)
Occupational Therapy Evaluation Patient Details Name: Kara Mullins MRN: [id number] DOB: [birth year] Today's Date: 02/26/2023   History of Present Illness 11 yof w/ chronic diastolic CHF w/ lvef 55%,GAD, HTN, HLD,[place], lymphedema presented with shortness of breath, worsening edema   Clinical Impression   Pt is at baseline level with ADLs/selfcare and mobility.transfers. Pt requires total - max A +2  with bed mobility to sit EOB, use of slide board max A +2 to transfer bed to chair (lift pad left underneath pt for nursing staff to safely transfer pt back to bed), total A with LB ADLs and set up with grooming, UB ADLs and self feeding. Pt's daughter provides her care 24/7 with grand daughter assisting as needed in the evening. Pt has a w/c with cushion and Stedy lift at home but has not used it, pt's R LE with limited AROM in knee flexion. Pt's daughter provides bed level care for toileting, bathing and set up with UB dressing and grooming when OOB with pt reporting that she does not get OOB everyday at home and it depends on how she feels. OT educated pt on risks of not getting OOB daily and the importance/benefits of participating in as much of her own selfcare as possible. Pt also educated on use of a hoyer lift at home vs [place] lift. All education is completed and no further acute OT services are indicated at this time     Recommendations for follow up therapy are one component of a multi-disciplinary discharge planning process, led by the attending physician.  Recommendations may be updated based on patient status, additional functional criteria and insurance authorization.   Assistance Recommended at Discharge Frequent or constant Supervision/Assistance  Patient can return home with the following A lot of help with bathing/dressing/bathroom;A lot of help with walking and/or transfers;Assist for transportation    Functional Status Assessment  Patient has not had a recent decline in their  functional status  Equipment Recommendations  None recommended by OT    Recommendations for Other Services       Precautions / Restrictions Precautions Precautions: Fall Restrictions Weight Bearing Restrictions: No      Mobility Bed Mobility Overal bed mobility: Needs Assistance Bed Mobility: Supine to Sit     Supine to sit: Max assist, Total assist, +2 for physical assistance     General bed mobility comments: total A with LE mgt to EOB, pt able to use rail with max A for trunk elevation    Transfers Overall transfer level: Needs assistance Equipment used: Sliding board               General transfer comment: max A +2 using slide board from EOB to recliner. Lift pad left under pt for nursing staff to safely transfer back to bed      Balance Overall balance assessment: Needs assistance Sitting-balance support: Bilateral upper extremity supported Sitting balance-Leahy Scale: Fair                                     ADL either performed or assessed with clinical judgement   ADL Overall ADL's : Needs assistance/impaired Eating/Feeding: Set up;Independent;Sitting   Grooming: Wash/dry hands;Wash/dry face;Set up;Sitting   Upper Body Bathing: Total assistance   Lower Body Bathing: Total assistance   Upper Body Dressing : Set up;Supervision/safety;Sitting   Lower Body Dressing: Total assistance   Toilet Transfer: Maximal assistance;+2 for physical assistance Toilet Transfer  Details (indicate cue type and reason): simulated with slide board from bed to recliner Toileting- Clothing Manipulation and Hygiene: Total assistance       Functional mobility during ADLs: Maximal assistance;+2 for physical assistance General ADL Comments: pt requires extensive assist at baseline with ADLs/selfcare     Vision Baseline Vision/History: 1 Wears glasses Ability to See in Adequate Light: 0 Adequate Patient Visual Report: No change from baseline        Perception     Praxis      Pertinent Vitals/Pain Pain Assessment Pain Assessment: 0-10 Pain Score: 5  Pain Location: R leg Pain Descriptors / Indicators: Constant, Discomfort, Grimacing Pain Intervention(s): Premedicated before session, Monitored during session     Hand Dominance Right   Extremity/Trunk Assessment Upper Extremity Assessment Upper Extremity Assessment: Generalized weakness;RUE deficits/detail;LUE deficits/detail RUE Deficits / Details: shoulder flexion 0-90 LUE Deficits / Details: shoulder flexion 0-90   Lower Extremity Assessment Lower Extremity Assessment: Defer to PT evaluation   Cervical / Trunk Assessment Cervical / Trunk Assessment: Other exceptions Cervical / Trunk Exceptions: large body habitus   Communication Communication Communication: No difficulties   Cognition Arousal/Alertness: Awake/alert Behavior During Therapy: Flat affect Overall Cognitive Status: Within Functional Limits for tasks assessed                                       General Comments       Exercises     Shoulder Instructions      Home Living Family/patient expects to be discharged to:: Private residence Living Arrangements: Children Available Help at Discharge: Family;Available 24 hours/day Type of Home: House Home Access: Ramped entrance     Home Layout: One level     Bathroom Shower/Tub: Sponge bathes at baseline (bed level)         Home Equipment: Rollator (4 wheels);Wheelchair - manual;BSC/3in1;Other (comment);Cane - single point;Hospital bed   Additional Comments: was sponge bathing, lives with daughter and son in law, daughter is with pt all day, grand dauhgter is able to assist in evenings, son in law works at night      Prior Functioning/Environment               Mobility Comments: has been unable to ambulate since 4/23, only standing short time, LE swelling has been limitation. Pt reports that she does not get OOB some  days. Pt reports that she was no longer able to SPT and that a Stedy lift is at her home, however she has not used it ADLs Comments: daughter assisted with bed level toileting, bathing for LB and dressing        OT Problem List: Decreased strength;Decreased activity tolerance;Decreased knowledge of use of DME or AE;Obesity;Pain      OT Treatment/Interventions:      OT Goals(Current goals can be found in the care plan section) Acute Rehab OT Goals Patient Stated Goal: go home  OT Frequency:      Co-evaluation PT/OT/SLP Co-Evaluation/Treatment: Yes Reason for Co-Treatment: To address functional/ADL transfers;For patient/therapist safety   OT goals addressed during session: ADL's and self-care;Proper use of Adaptive equipment and DME      AM-PAC OT "6 Clicks" Daily Activity     Outcome Measure Help from another person eating meals?: None Help from another person taking care of personal grooming?: A Little Help from another person toileting, which includes using toliet, bedpan, or urinal?: Total Help from  another person bathing (including washing, rinsing, drying)?: Total Help from another person to put on and taking off regular upper body clothing?: A Little Help from another person to put on and taking off regular lower body clothing?: Total 6 Click Score: 13   End of Session Equipment Utilized During Treatment: Gait belt;Other (comment) (slide board) Nurse Communication: Mobility status  Activity Tolerance: Patient limited by fatigue Patient left: in chair;with call bell/phone within reach  OT Visit Diagnosis: Other abnormalities of gait and mobility (R26.89)                Time: 1610-9604 OT Time Calculation (min): 50 min Charges:  OT General Charges $OT Visit: 1 Visit OT Evaluation $OT Eval Moderate Complexity: 1 Mod OT Treatments $Self Care/Home Management : 8-22 mins    Galen Manila 02/26/2023, 1:29 PM

## 2023-02-26 NOTE — Evaluation (Signed)
Physical Therapy Evaluation Patient Details Name: Kara Mullins MRN: [id number] DOB: May 29, 1951 Today's Date: 02/26/2023  History of Present Illness  89 yof w/ chronic diastolic CHF w/ lvef 55%,GAD, HTN, HLD,Obseity, lymphedema presented with shortness of breath, worsening edema  Clinical Impression  Pt presents with admitting diagnosis above. Co treat with OT. At baseline pt reports mostly being bed bound with total assistance from family. Pt states she occasionally is able to transfer to [facility] via slideboard with assistance from daughter. Today required +2 Max A for bed mobility and +2 Total A for slideboard transfer to chair. Nursing staff instructed on using maximove for transferring pt back to bed. Despite this, Pt presents at or near baseline mobility. Pt has no further acute PT needs and will be signing off. Re consult PT if mobility status changes.        Recommendations for follow up therapy are one component of a multi-disciplinary discharge planning process, led by the attending physician.  Recommendations may be updated based on patient status, additional functional criteria and insurance authorization.  Follow Up Recommendations       Assistance Recommended at Discharge Frequent or constant Supervision/Assistance  Patient can return home with the following  Two people to help with walking and/or transfers;A lot of help with bathing/dressing/bathroom;Assistance with cooking/housework;Direct supervision/assist for medications management;Assist for transportation;Help with stairs or ramp for entrance    Equipment Recommendations Other (comment) Michiel Sites lift)  Recommendations for Other Services       Functional Status Assessment Patient has had a recent decline in their functional status and demonstrates the ability to make significant improvements in function in a reasonable and predictable amount of time.     Precautions / Restrictions Precautions Precautions: Fall Precaution  Comments: pt is nonambulatory Restrictions Weight Bearing Restrictions: No      Mobility  Bed Mobility Overal bed mobility: Needs Assistance Bed Mobility: Supine to Sit     Supine to sit: Max assist, Total assist, +2 for physical assistance     General bed mobility comments: total A with LE mgt to EOB, pt able to use rail with max A for trunk elevation    Transfers Overall transfer level: Needs assistance Equipment used: Sliding board               General transfer comment: max A +2 using slide board from EOB to recliner. Lift pad left under pt for nursing staff to safely transfer back to bed    Ambulation/Gait               General Gait Details: Unable  Stairs            Wheelchair Mobility    Modified Rankin (Stroke Patients Only)       Balance Overall balance assessment: Needs assistance Sitting-balance support: Bilateral upper extremity supported Sitting balance-Leahy Scale: Fair                                       Pertinent Vitals/Pain Pain Assessment Pain Assessment: 0-10 Pain Score: 5  Pain Location: R leg Pain Descriptors / Indicators: Constant, Discomfort, Grimacing Pain Intervention(s): Premedicated before session, Monitored during session    Home Living Family/patient expects to be discharged to:: Private residence Living Arrangements: Children Available Help at Discharge: Family;Available 24 hours/day Type of Home: House Home Access: Ramped entrance       Home Layout: One level Home Equipment: Rollator (  4 wheels);Wheelchair - manual;BSC/3in1;Other (comment);Cane - single point;Hospital bed Additional Comments: was sponge bathing, lives with daughter and son in law, daughter is with pt all day, grand dauhgter is able to assist in evenings, son in law works at night    Prior Function Prior Level of Function : Needs assist             Mobility Comments: has been unable to ambulate since 4/23, only  standing short time, LE swelling has been limitation. Pt reports that she does not get OOB some days. Pt reports that she was no longer able to SPT and that a Stedy lift is at her home, however she has not used it ADLs Comments: daughter assisted with bed level toileting, bathing for LB and dressing     Hand Dominance   Dominant Hand: Right    Extremity/Trunk Assessment   Upper Extremity Assessment Upper Extremity Assessment: Generalized weakness RUE Deficits / Details: shoulder flexion 0-90 LUE Deficits / Details: shoulder flexion 0-90    Lower Extremity Assessment Lower Extremity Assessment: Generalized weakness;RLE deficits/detail RLE Deficits / Details: R flexion contracture    Cervical / Trunk Assessment Cervical / Trunk Assessment: Other exceptions Cervical / Trunk Exceptions: large body habitus  Communication   Communication: No difficulties  Cognition Arousal/Alertness: Awake/alert Behavior During Therapy: Flat affect Overall Cognitive Status: Within Functional Limits for tasks assessed                                          General Comments General comments (skin integrity, edema, etc.): VSS on RA    Exercises     Assessment/Plan    PT Assessment Patient does not need any further PT services  PT Problem List         PT Treatment Interventions      PT Goals (Current goals can be found in the Care Plan section)       Frequency       Co-evaluation   Reason for Co-Treatment: To address functional/ADL transfers;For patient/therapist safety   OT goals addressed during session: ADL's and self-care;Proper use of Adaptive equipment and DME       AM-PAC PT "6 Clicks" Mobility  Outcome Measure Help needed turning from your back to your side while in a flat bed without using bedrails?: A Lot Help needed moving from lying on your back to sitting on the side of a flat bed without using bedrails?: Total Help needed moving to and from a  bed to a chair (including a wheelchair)?: Total Help needed standing up from a chair using your arms (e.g., wheelchair or bedside chair)?: Total Help needed to walk in hospital room?: Total Help needed climbing 3-5 steps with a railing? : Total 6 Click Score: 7    End of Session Equipment Utilized During Treatment: Gait belt Activity Tolerance: Patient tolerated treatment well Patient left: in chair;with call bell/phone within reach;with chair alarm set Nurse Communication: Mobility status;Need for lift equipment PT Visit Diagnosis: Muscle weakness (generalized) (M62.81);Difficulty in walking, not elsewhere classified (R26.2);Adult, failure to thrive (R62.7);Other abnormalities of gait and mobility (R26.89)    Time: 1610-9604 PT Time Calculation (min) (ACUTE ONLY): 53 min   Charges:   PT Evaluation $PT Eval Moderate Complexity: 1 Mod PT Treatments $Therapeutic Activity: 23-37 mins        Shela Nevin, PT, DPT Acute Rehab Services [phone]   Joyce Gross  Charrisse Masley 02/26/2023, 2:52 PM

## 2023-02-26 NOTE — Progress Notes (Signed)
Mobility Specialist Progress Note:   02/26/23 1500  Mobility  Activity Transferred from chair to bed  Level of Assistance +2 (takes two people)  Assistive Device MaxiMove  Activity Response Tolerated well  Mobility Referral Yes  $Mobility charge 1 Mobility  Mobility Specialist Start Time (ACUTE ONLY) 1445  Mobility Specialist Stop Time (ACUTE ONLY) 1500  Mobility Specialist Time Calculation (min) (ACUTE ONLY) 15 min   Pt eager to return to bed. Tolerated transfer well with mild pain in BLE. Pt back in bed with all needs met.   Addison Lank Mobility Specialist Please contact via SecureChat or  Rehab office at [phone]

## 2023-02-26 NOTE — Progress Notes (Signed)
PROGRESS NOTE Kara Mullins  UYQ:[id number] DOB: 1950-10-20 DOA: 02/25/2023 PCP: Caesar Bookman, NP  Brief Narrative/Hospital Course: 62 yof w/ chronic diastolic CHF w/ lvef 55%,GAD, HTN, HLD,Obseity, lymphedema and stage IV b/l bun/creat 60/3.0 (01/22/23) previously in 90s/5 presented with shortness of breath, worsening edema. She went in for Retacrit injection on 3/14 and was given blood transfusion.  Her fluid balance has seemed off since.  She had been on torsemide but the dose was decreased because her renal function worsened.  She restarted the torsemide at 40 mg BID (instead of 80) but  her edema has worsened and her daughter noticed pitting edema in her flanks In the ED hypertensive 180-190, labs showed BNP 1037 troponin 23>, hemoglobin 10.3 g, BUN 79 creatinine 3.9. Chest x-ray bilateral pleural effusion left greater than right right effusion appears slightly larger compared to x-ray 08/18/2022.  Patient was given IV Lasix and admitted for working diagnosis of acute on chronic CHF/cardiorenal syndrome    Subjective: Seen and examined this morning resting comfortably in the bedside chair On room air Weight down by 1 pound Stable creatinine, lower leg edematous   Assessment and Plan: Principal Problem:   Cardiorenal syndrome Active Problems:   CKD (chronic kidney disease) stage 5, GFR less than 15 ml/min (HCC)   Chronic combined systolic and diastolic congestive heart failure (HCC)   Essential hypertension   Hyperlipidemia   Chronic kidney disease (CKD), stage IV (severe) (HCC)    Acute on chronic diastolic heart failure: Last EF 20% 8/23 with moderate elevated pulmonary pressure, moderate left atrial enlargement, presenting w/ worsening of shortness of breath and lower leg edema.  Appreciate cardiology input on board. TTE. Patient Continue diuresis w iv lasix per nephro/cardio and GDMT per cardiology. Cont to monitor daily I/O, daly weight ( changes as beow), monitor  electrolytes and net balance as below. Keep on 2 gm salt and fluid restricted diet. Net IO Since Admission: -1,030 mL [02/26/23 1249]  Filed Weights   02/25/23 1706 02/26/23 0425  Weight: 84.5 kg 84 kg    Recent Labs  Lab 02/25/23 1226 02/26/23 0108  BNP 1,037.5*  --   BUN 79* 79*  CREATININE 3.91* 3.94*  K 3.5 3.4*  MG  --  2.8*    Hypokalemia 3.4 replace while on Lasix  CKD stage IV-V:bun/creat 60/3.0 (01/22/23) previously in 90s/5.  Need close monitoring while on diuretics, nephro has been consulted. Recent Labs    08/15/22 0330 08/16/22 0444 08/17/22 0055 08/18/22 0046 08/23/22 0000 10/04/22 1249 10/29/22 0000 01/22/23 0000 02/25/23 1226 02/26/23 0108  BUN 107* 110* 106* 106* 106* 126* 92* 60* 79* 79*  CREATININE 5.20* 5.23* 5.39* 5.12* 5.5* 6.53* 4.3* 3.0* 3.91* 3.94*  CO2 19* 18* 18* 17* 16 19* 15 18 18* 19*     Metabolic acidosis due to CKD resume home bicarb Metabolic bone disease: continue Renvela  Anemia of chronic renal disease hemoglobin appears to be close to baseline now 8 to 10 g.  Previously as low as 6.3 g with 12/26/2022 needing blood transfusion Recent Labs  Lab 02/25/23 1226 02/26/23 0108  HGB 10.3* 8.8*  HCT 34.0* 28.1*    Hypertension: Uncontrolled in the ED, currently stable on hydralazine 50 mgtid, resume home Coreg.  Class I obesity with BMI 30.8 will benefit weight loss, sleep apnea evaluation   Sacral Pressure ulcer poa see below: Pressure Injury 03/28/22 Sacrum Mid Stage 1 -  Intact skin with non-blanchable redness of a localized area usually over a bony  prominence. Area is darker and non-blanchable. Intact, clean, dry. (Active)  03/28/22 0126  Location: Sacrum  Location Orientation: Mid  Staging: Stage 1 -  Intact skin with non-blanchable redness of a localized area usually over a bony prominence.  Wound Description (Comments): Area is darker and non-blanchable. Intact, clean, dry.  Present on Admission: Yes  Dressing Type Foam -  Lift dressing to assess site every shift 02/25/23 2005   Class I Obesity:Patient's Body mass index is 30.82 kg/m. : Will benefit with PCP follow-up, weight loss  healthy lifestyle and outpatient sleep evaluation.   DVT prophylaxis: heparin injection 5,000 Units Start: 02/25/23 1615 Code Status:   Code Status: Full Code Family Communication: plan of care discussed with patient at bedside. Patient status is: Inpatient because of CHF Level of care: Telemetry Cardiac   Dispo: The patient is from: HOME lives with daughter            Anticipated disposition: home > 2-3 days Objective: Vitals last 24 hrs: Vitals:   02/26/23 0730 02/26/23 0736 02/26/23 0815 02/26/23 1110  BP: (!) 145/69 (!) 149/53 116/66 (!) 174/79  Pulse: 72 73 72 65  Resp: 13 18 14 15   Temp: 97.8 F (36.6 C)  97.8 F (36.6 C) 97.6 F (36.4 C)  TempSrc: Oral Oral Oral Axillary  SpO2: 97% 97% 97% 98%  Weight:      Height:       Weight change:   Physical Examination: General exam: alert awake, older than stated age HEENT:Oral mucosa moist, Ear/Nose WNL grossly Respiratory system: bilaterally clear BS, no use of accessory muscle Cardiovascular system: S1 & S2 +, No JVD. Gastrointestinal system: Abdomen soft,NT,ND, BS+ Nervous System:Alert, awake, moving extremities. Extremities: LE edema +++ upto thigh,distal peripheral pulses palpable.  Skin: No rashes,no icterus. MSK: Normal muscle bulk,tone, power  Medications reviewed:  Scheduled Meds:  carvedilol  3.125 mg Oral BID WC   furosemide  80 mg Intravenous TID   heparin  5,000 Units Subcutaneous Q8H   hydrALAZINE  50 mg Oral Q8H   potassium chloride  20 mEq Oral Daily   sevelamer carbonate  1,600 mg Oral TID WC   sodium bicarbonate  650 mg Oral BID   sodium chloride flush  3 mL Intravenous Q12H   Continuous Infusions: Diet Order             Diet renal with fluid restriction Fluid restriction: 1200 mL Fluid; Room service appropriate? Yes; Fluid  consistency: Thin  Diet effective now                   Intake/Output Summary (Last 24 hours) at 02/26/2023 1247 Last data filed at 02/26/2023 0859 Gross per 24 hour  Intake 120 ml  Output 1150 ml  Net -1030 ml   Net IO Since Admission: -1,030 mL [02/26/23 1247]  Wt Readings from Last 3 Encounters:  02/26/23 84 kg  08/18/22 84.5 kg  06/17/22 91.2 kg     Unresulted Labs (From admission, onward)     Start     Ordered   02/27/23 0500  Basic metabolic panel  Daily,   R      02/26/23 0754   02/27/23 0500  CBC  Daily,   R      02/26/23 0754          Data Reviewed: I have personally reviewed following labs and imaging studies CBC: Recent Labs  Lab 02/25/23 1226 02/26/23 0108  WBC 4.1 4.0  NEUTROABS 3.0  --  HGB 10.3* 8.8*  HCT 34.0* 28.1*  MCV 94.2 90.6  PLT 192 183  Basic Metabolic Panel: Recent Labs  Lab 02/25/23 1226 02/26/23 0108  NA 137 140  K 3.5 3.4*  CL 108 111  CO2 18* 19*  GLUCOSE 89 88  BUN 79* 79*  CREATININE 3.91* 3.94*  CALCIUM 7.2* 7.1*  MG  --  2.8*   No results found for this or any previous visit (from the past 240 hour(s)).  Antimicrobials: Anti-infectives (From admission, onward)    None      Culture/Microbiology None  Radiology Studies: DG Chest Portable 1 View  Result Date: 02/25/2023 CLINICAL DATA:  Shortness of breath EXAM: PORTABLE CHEST 1 VIEW COMPARISON:  X-ray 08/18/2022 FINDINGS: Overlapping cardiac leads. Enlarged cardiopericardial silhouette with calcified tortuous aorta. Persistent bilateral pleural effusions, left-greater-than-right. The right effusion is slightly larger today. No pneumothorax. Central vascular congestion. Curvature of the spine with degenerative changes. Film is rotated to the left. Osteopenia IMPRESSION: Bilateral pleural effusions, left-greater-than-right. The right effusion appears slightly larger today. Enlarged heart with vascular congestion. Electronically Signed   By: Karen Kays M.D.   On:  02/25/2023 12:45     LOS: 1 day   Lanae Boast, MD [facility]  02/26/2023, 12:47 PM

## 2023-02-27 DIAGNOSIS — R601 Generalized edema: Secondary | ICD-10-CM

## 2023-02-27 DIAGNOSIS — I5033 Acute on chronic diastolic (congestive) heart failure: Secondary | ICD-10-CM | POA: Diagnosis not present

## 2023-02-27 DIAGNOSIS — Z515 Encounter for palliative care: Secondary | ICD-10-CM

## 2023-02-27 DIAGNOSIS — Z7189 Other specified counseling: Secondary | ICD-10-CM

## 2023-02-27 DIAGNOSIS — I10 Essential (primary) hypertension: Secondary | ICD-10-CM | POA: Diagnosis not present

## 2023-02-27 DIAGNOSIS — N184 Chronic kidney disease, stage 4 (severe): Secondary | ICD-10-CM | POA: Diagnosis not present

## 2023-02-27 LAB — BASIC METABOLIC PANEL
Anion gap: 9 (ref 5–15)
BUN: 79 mg/dL — ABNORMAL HIGH (ref 8–23)
CO2: 20 mmol/L — ABNORMAL LOW (ref 22–32)
Calcium: 7.1 mg/dL — ABNORMAL LOW (ref 8.9–10.3)
Chloride: 110 mmol/L (ref 98–111)
Creatinine, Ser: 3.92 mg/dL — ABNORMAL HIGH (ref 0.44–1.00)
GFR, Estimated: 12 mL/min — ABNORMAL LOW (ref >60)
Glucose, Bld: 65 mg/dL — ABNORMAL LOW (ref 70–99)
Potassium: 3.6 mmol/L (ref 3.5–5.1)
Sodium: 139 mmol/L (ref 135–145)

## 2023-02-27 LAB — CBC
HCT: 27 % — ABNORMAL LOW (ref 36.0–46.0)
Hemoglobin: 8.4 g/dL — ABNORMAL LOW (ref 12.0–15.0)
MCH: 28.5 pg (ref 26.0–34.0)
MCHC: 31.1 g/dL (ref 30.0–36.0)
MCV: 91.5 fL (ref 80.0–100.0)
Platelets: 172 10*3/uL (ref 150–400)
RBC: 2.95 MIL/uL — ABNORMAL LOW (ref 3.87–5.11)
RDW: 17.5 % — ABNORMAL HIGH (ref 11.5–15.5)
WBC: 3.8 10*3/uL — ABNORMAL LOW (ref 4.0–10.5)
nRBC: 0.5 % — ABNORMAL HIGH (ref 0.0–0.2)

## 2023-02-27 MED ORDER — FOLIC ACID 1 MG PO TABS
1.0000 mg | ORAL_TABLET | Freq: Every day | ORAL | Status: DC
  Administered 2023-02-27 – 2023-03-03 (×5): 1 mg via ORAL
  Filled 2023-02-27 (×5): qty 1

## 2023-02-27 MED ORDER — POLYSACCHARIDE IRON COMPLEX 150 MG PO CAPS
150.0000 mg | ORAL_CAPSULE | Freq: Every day | ORAL | Status: DC
  Administered 2023-02-27 – 2023-03-03 (×5): 150 mg via ORAL
  Filled 2023-02-27 (×5): qty 1

## 2023-02-27 MED ORDER — CHLORHEXIDINE GLUCONATE CLOTH 2 % EX PADS
6.0000 | MEDICATED_PAD | Freq: Every day | CUTANEOUS | Status: DC
  Administered 2023-02-27 – 2023-03-03 (×4): 6 via TOPICAL

## 2023-02-27 MED ORDER — CHLORHEXIDINE GLUCONATE CLOTH 2 % EX PADS
6.0000 | MEDICATED_PAD | Freq: Every day | CUTANEOUS | Status: DC
  Administered 2023-02-28 – 2023-03-03 (×4): 6 via TOPICAL

## 2023-02-27 NOTE — Progress Notes (Signed)
[facility] Progress Note   Assessment/ Plan:    CKD V: at baseline Cr, maybe a little better, in the setting of volume overload             - continue Lasix 80 IV TID              - BP is generous to allow for that             - follow closely             - nondialytic intervention   2.  Acute on chronic CHF exac:                       - cardiology following   3.  Anemia:             - Hgb 10.3, continue to follow  - gets retacrit at [facility]   4.  Dispo: pending  Subjective:    Seen in room.  Feeling better.  Has improved SOB.  Cr stable   Objective:   BP (!) 142/60   Pulse 68   Temp 97.8 F (36.6 C) (Oral)   Resp 17   Ht 5\' 5"  (1.651 m)   Wt 85 kg   SpO2 96%   BMI 31.18 kg/m   Intake/Output Summary (Last 24 hours) at 02/27/2023 1148 Last data filed at 02/27/2023 1610 Gross per 24 hour  Intake 120 ml  Output 725 ml  Net -605 ml   Weight change: 0.5 kg  Physical Exam: GEN  sitting in chair NAD HEENT sclerae anicteric NECK  no JVD PULM clear CV RR ABD soft EXT 3+ pitting edema bilaterally NEURO AAO x 3 nonfocal  Imaging: ECHOCARDIOGRAM COMPLETE  Result Date: 02/26/2023    ECHOCARDIOGRAM REPORT   Patient Name:   Kara Mullins Date of Exam: 02/26/2023 Medical Rec #:  [id number]      Height:       65.0 in Accession #:    [id number]     Weight:       185.2 lb Date of Birth:  [birth year]     BSA:          1.914 m Patient Age:    71 years       BP:           174/79 mmHg Patient Gender: F              HR:           60 bpm. Exam Location:  Inpatient Procedure: 2D Echo, Cardiac Doppler, Color Doppler and Strain Analysis Indications:    CHF- Acute Diastolic  History:        Patient has prior history of Echocardiogram examinations, most                 recent 06/03/2022. CHF; Risk Factors:Hypertension, Dyslipidemia                 and Stage 3 Chronic Kidney Disease.  Sonographer:    Raeford Razor Referring Phys: [id number] CHRISTOPHER L SCHUMANN IMPRESSIONS  1. Left  ventricular ejection fraction, by estimation, is 50 to 55%. The left ventricle has low normal function. The left ventricle has no regional wall motion abnormalities. There is severe concentric left ventricular hypertrophy. Left ventricular diastolic function could not be evaluated. The average left ventricular global longitudinal strain is -13.0 %. The global longitudinal strain is abnormal.  2. Right ventricular systolic function is  normal. The right ventricular size is normal. Mildly increased right ventricular wall thickness. There is normal pulmonary artery systolic pressure.  3. Left atrial size was moderately dilated.  4. Large pleural effusion in both left and right lateral regions.  5. The mitral valve is abnormal. Mild mitral valve regurgitation. Moderate mitral annular calcification.  6. The aortic valve is tricuspid. There is mild calcification of the aortic valve. There is moderate thickening of the aortic valve. Aortic valve regurgitation is mild. Aortic valve sclerosis/calcification is present, without any evidence of aortic stenosis.  7. The inferior vena cava is normal in size with <50% respiratory variability, suggesting right atrial pressure of 8 mmHg.  8. Cannot exclude a small PFO. Comparison(s): No significant change from prior study. Prior images reviewed side by side. Conclusion(s)/Recommendation(s): Consider future nuclear scintigraphy study to rule out cardiac amyloisosis if clinically indicated. FINDINGS  Left Ventricle: No systolic anterior motion of the mitral valve, no LVOT gradient. Abnormal apical to basal strain ratio, 2.5/1. Left ventricular ejection fraction, by estimation, is 50 to 55%. The left ventricle has low normal function. The left ventricle has no regional wall motion abnormalities. The average left ventricular global longitudinal strain is -13.0 %. The global longitudinal strain is abnormal. The left ventricular internal cavity size was normal in size. There is severe  concentric left ventricular hypertrophy. Left ventricular diastolic function could not be evaluated due to mitral annular calcification (moderate or greater). Left ventricular diastolic function could not be evaluated. Right Ventricle: The right ventricular size is normal. Mildly increased right ventricular wall thickness. Right ventricular systolic function is normal. There is normal pulmonary artery systolic pressure. The tricuspid regurgitant velocity is 2.08 m/s, and with an assumed right atrial pressure of 8 mmHg, the estimated right ventricular systolic pressure is 25.3 mmHg. Left Atrium: Left atrial size was moderately dilated. Right Atrium: Right atrial size was normal in size. Pericardium: Trivial pericardial effusion is present. The pericardial effusion is posterior to the left ventricle. Mitral Valve: The mitral valve is abnormal. Moderate mitral annular calcification. Mild mitral valve regurgitation. Tricuspid Valve: The tricuspid valve is normal in structure. Tricuspid valve regurgitation is mild . No evidence of tricuspid stenosis. Aortic Valve: The aortic valve is tricuspid. There is mild calcification of the aortic valve. There is moderate thickening of the aortic valve. There is mild aortic valve annular calcification. Aortic valve regurgitation is mild. Aortic regurgitation PHT  measures 437 msec. Aortic valve sclerosis/calcification is present, without any evidence of aortic stenosis. Aortic valve mean gradient measures 4.0 mmHg. Aortic valve peak gradient measures 6.9 mmHg. Aortic valve area, by VTI measures 1.92 cm. Pulmonic Valve: The pulmonic valve was grossly normal. Pulmonic valve regurgitation is mild. No evidence of pulmonic stenosis. Aorta: The aortic root and ascending aorta are structurally normal, with no evidence of dilitation. Venous: The inferior vena cava is normal in size with less than 50% respiratory variability, suggesting right atrial pressure of 8 mmHg. IAS/Shunts: The  interatrial septum appears to be lipomatous. Cannot exclude a small PFO. Additional Comments: There is a large pleural effusion in both left and right lateral regions.  LEFT VENTRICLE PLAX 2D LVIDd:         4.10 cm     Diastology LVIDs:         3.20 cm     LV e' medial:    3.92 cm/s LV PW:         2.10 cm     LV E/e' medial:  22.7 LV  IVS:        1.20 cm     LV e' lateral:   5.44 cm/s LVOT diam:     1.90 cm     LV E/e' lateral: 16.3 LV SV:         79 LV SV Index:   41          2D Longitudinal Strain LVOT Area:     2.84 cm    2D Strain GLS Avg:     -13.0 %  LV Volumes (MOD) LV vol d, MOD A2C: 97.2 ml LV vol d, MOD A4C: 91.0 ml LV vol s, MOD A2C: 52.5 ml LV vol s, MOD A4C: 45.7 ml LV SV MOD A2C:     44.7 ml LV SV MOD A4C:     91.0 ml LV SV MOD BP:      43.1 ml RIGHT VENTRICLE             IVC RV Basal diam:  2.80 cm     IVC diam: 1.90 cm RV S prime:     10.20 cm/s TAPSE (M-mode): 2.6 cm LEFT ATRIUM             Index        RIGHT ATRIUM           Index LA diam:        2.55 cm 1.33 cm/m   RA Area:     12.20 cm LA Vol (A2C):   94.9 ml 49.57 ml/m  RA Volume:   31.40 ml  16.40 ml/m LA Vol (A4C):   53.5 ml 27.94 ml/m LA Biplane Vol: 77.6 ml 40.53 ml/m  AORTIC VALVE AV Area (Vmax):    2.34 cm AV Area (Vmean):   2.06 cm AV Area (VTI):     1.92 cm AV Vmax:           131.00 cm/s AV Vmean:          96.500 cm/s AV VTI:            0.413 m AV Peak Grad:      6.9 mmHg AV Mean Grad:      4.0 mmHg LVOT Vmax:         108.00 cm/s LVOT Vmean:        70.000 cm/s LVOT VTI:          0.280 m LVOT/AV VTI ratio: 0.68 AI PHT:            437 msec  AORTA Ao Root diam: 3.10 cm Ao Asc diam:  3.20 cm MITRAL VALVE                TRICUSPID VALVE MV Area (PHT): 3.60 cm     TR Peak grad:   17.3 mmHg MV Decel Time: 211 msec     TR Vmax:        208.00 cm/s MV E velocity: 88.80 cm/s MV A velocity: 109.00 cm/s  SHUNTS MV E/A ratio:  0.81         Systemic VTI:  0.28 m                             Systemic Diam: 1.90 cm Riley Lam MD  Electronically signed by Riley Lam MD Signature Date/Time: 02/26/2023/4:31:56 PM    Final    DG Chest Portable 1 View  Result Date: 02/25/2023 CLINICAL DATA:  Shortness of breath EXAM: PORTABLE CHEST 1 VIEW  COMPARISON:  X-ray 08/18/2022 FINDINGS: Overlapping cardiac leads. Enlarged cardiopericardial silhouette with calcified tortuous aorta. Persistent bilateral pleural effusions, left-greater-than-right. The right effusion is slightly larger today. No pneumothorax. Central vascular congestion. Curvature of the spine with degenerative changes. Film is rotated to the left. Osteopenia IMPRESSION: Bilateral pleural effusions, left-greater-than-right. The right effusion appears slightly larger today. Enlarged heart with vascular congestion. Electronically Signed   By: Karen Kays M.D.   On: 02/25/2023 12:45    Labs: BMET Recent Labs  Lab 02/25/23 1226 02/26/23 0108 02/27/23 0130  NA 137 140 139  K 3.5 3.4* 3.6  CL 108 111 110  CO2 18* 19* 20*  GLUCOSE 89 88 65*  BUN 79* 79* 79*  CREATININE 3.91* 3.94* 3.92*  CALCIUM 7.2* 7.1* 7.1*   CBC Recent Labs  Lab 02/25/23 1226 02/26/23 0108 02/27/23 0130  WBC 4.1 4.0 3.8*  NEUTROABS 3.0  --   --   HGB 10.3* 8.8* 8.4*  HCT 34.0* 28.1* 27.0*  MCV 94.2 90.6 91.5  PLT 192 183 172    Medications:     carvedilol  3.125 mg Oral BID WC   Chlorhexidine Gluconate Cloth  6 each Topical Daily   Chlorhexidine Gluconate Cloth  6 each Topical Q0600   furosemide  80 mg Intravenous TID   heparin  5,000 Units Subcutaneous Q8H   hydrALAZINE  50 mg Oral Q8H   potassium chloride  20 mEq Oral Daily   sevelamer carbonate  1,600 mg Oral TID WC   sodium bicarbonate  650 mg Oral BID   sodium chloride flush  3 mL Intravenous Q12H    Bufford Buttner MD 02/27/2023, 11:48 AM

## 2023-02-27 NOTE — Progress Notes (Signed)
Pt refused for her Foley catheter to be changed, her daughter changes it for her at home, every 3-4 weeks, Thanks Glenna Fellows.

## 2023-02-27 NOTE — Plan of Care (Signed)
  Problem: Nutrition: Goal: Adequate nutrition will be maintained Outcome: Completed/Met   Problem: Pain Managment: Goal: General experience of comfort will improve Outcome: Completed/Met   

## 2023-02-27 NOTE — Progress Notes (Signed)
PROGRESS NOTE Kara Mullins  HYQ:[id number] DOB: December 11, 1950 DOA: 02/25/2023 PCP: Caesar Bookman, NP  Brief Narrative/Hospital Course: 19 yof w/ chronic diastolic CHF w/ lvef 55%,GAD, HTN, HLD,Obseity, lymphedema and stage IV b/l bun/creat 60/3.0 (01/22/23) previously in 90s/5 presented with shortness of breath, worsening edema. She went in for Retacrit injection on 3/14 and was given blood transfusion.  Her fluid balance has seemed off since.  She had been on torsemide but the dose was decreased because her renal function worsened.  She restarted the torsemide at 40 mg BID (instead of 80) but  her edema has worsened and her daughter noticed pitting edema in her flanks In the ED hypertensive 180-190, labs showed BNP 1037 troponin 23>, hemoglobin 10.3 g, BUN 79 creatinine 3.9. Chest x-ray bilateral pleural effusion left greater than right right effusion appears slightly larger compared to x-ray 08/18/2022. Patient was given IV Lasix and admitted for acute on chronic CHF-cardiology and nephrology consulted.     Subjective: Patient seen and examined.  Resting comfortably reports she was short of breath when she woke up early this morning  Currently doing well, legs appears still swollen  Output 875 cc Wt at 186>185>187 lb   Assessment and Plan: Principal Problem:   Acute on chronic diastolic CHF (congestive heart failure) (HCC) Active Problems:   CKD (chronic kidney disease) stage 5, GFR less than 15 ml/min (HCC)   Essential hypertension   Hyperlipidemia   Chronic kidney disease (CKD), stage IV (severe) (HCC)   Acute on chronic diastolic heart failure: Last EF 20% 8/23 with moderate elevated pulmonary pressure, moderate left atrial enlargement, presenting w/ worsening of shortness of breath and lower leg edema.  Cardiology nephrology following. Echo showed EF 50-55%, no RWMA, severe concentric LVH could not evaluate diastolic function. Weight remains up and output marginal, creatinine remains  stable Continue on increased dose of Lasix 80 mg 3 times daily Cont to monitor daily I/O, daly weight ( changes as beow), monitor electrolytes and net balance as below. Keep on 2 gm salt and fluid restricted diet. Net IO Since Admission: -1,635 mL [02/27/23 1021]  Filed Weights   02/25/23 1706 02/26/23 0425 02/27/23 0518  Weight: 84.5 kg 84 kg 85 kg    Recent Labs  Lab 02/25/23 1226 02/26/23 0108 02/27/23 0130  BNP 1,037.5*  --   --   BUN 79* 79* 79*  CREATININE 3.91* 3.94* 3.92*  K 3.5 3.4* 3.6  MG  --  2.8*  --     Hypokalemia-resolved, monitor  CKD stage IV-V:bun/creat 60/3.0 (01/22/23) previously in 90s/5.  Creatinine remains stable nephrology following closely input appreciated Recent Labs    08/16/22 0444 08/17/22 0055 08/18/22 0046 08/23/22 0000 10/04/22 1249 10/29/22 0000 01/22/23 0000 02/25/23 1226 02/26/23 0108 02/27/23 0130  BUN 110* 106* 106* 106* 126* 92* 60* 79* 79* 79*  CREATININE 5.23* 5.39* 5.12* 5.5* 6.53* 4.3* 3.0* 3.91* 3.94* 3.92*  CO2 18* 18* 17* 16 19* 15 18 18* 19* 20*     Metabolic acidosis due to CKD -cont home bicarb Metabolic bone disease: continue Renvela  Anemia of chronic renal disease hemoglobin appears to be close to baseline now 8 to 10 g.  Previously as low as 6.3 g with 12/26/2022 needing blood transfusion. Recent Labs  Lab 02/25/23 1226 02/26/23 0108 02/27/23 0130  HGB 10.3* 8.8* 8.4*  HCT 34.0* 28.1* 27.0*    Hypertension: Uncontrolled in the ED, currently stable on hydralazine 50 mgtid, Coreg.  Class I obesity with BMI 30.8 will  benefit weight loss, sleep apnea evaluation   Sacral Pressure ulcer poa see below: Pressure Injury 03/28/22 Sacrum Mid Stage 1 -  Intact skin with non-blanchable redness of a localized area usually over a bony prominence. Area is darker and non-blanchable. Intact, clean, dry. (Active)  03/28/22 0126  Location: Sacrum  Location Orientation: Mid  Staging: Stage 1 -  Intact skin with non-blanchable  redness of a localized area usually over a bony prominence.  Wound Description (Comments): Area is darker and non-blanchable. Intact, clean, dry.  Present on Admission: Yes  Dressing Type Foam - Lift dressing to assess site every shift 02/25/23 2005   Class I Obesity:Patient's Body mass index is 31.18 kg/m. : Will benefit with PCP follow-up, weight loss  healthy lifestyle and outpatient sleep evaluation.  Goals of care: Palliative care has been consulted currently full code  DVT prophylaxis: heparin injection 5,000 Units Start: 02/25/23 1615 Code Status:   Code Status: Full Code Family Communication: plan of care discussed with patient at bedside. Patient status is: Inpatient because of CHF Level of care: Telemetry Cardiac   Dispo: The patient is from: HOME lives with daughter            Anticipated disposition: home > 2-3 days Objective: Vitals last 24 hrs: Vitals:   02/26/23 2251 02/27/23 0010 02/27/23 0518 02/27/23 0752  BP: 128/63 (!) 133/52 (!) 151/63 (!) 118/52  Pulse: 66 70 73 76  Resp: 18 18 17 19   Temp: 98.5 F (36.9 C) 98.1 F (36.7 C) 98.8 F (37.1 C) 98.3 F (36.8 C)  TempSrc: Oral Oral Oral Oral  SpO2:  97% 97% 97%  Weight:   85 kg   Height:       Weight change: 0.5 kg  Physical Examination: General exam: alert awake, pleasant, older than stated age HEENT:Oral mucosa moist, Ear/Nose WNL grossly Respiratory system: Bilaterally clear BS, no use of accessory muscle Cardiovascular system: S1 & S2 +, No JVD. Gastrointestinal system: Abdomen soft,NT,ND, BS+ Nervous System: Alert, awake, moving extremities, she follows commands. Extremities: LE edema +++ upto upper thigh,distal peripheral pulses palpable.  Skin: No rashes,no icterus. MSK: Normal muscle bulk,tone, power   Medications reviewed:  Scheduled Meds:  carvedilol  3.125 mg Oral BID WC   Chlorhexidine Gluconate Cloth  6 each Topical Daily   Chlorhexidine Gluconate Cloth  6 each Topical Q0600    furosemide  80 mg Intravenous TID   heparin  5,000 Units Subcutaneous Q8H   hydrALAZINE  50 mg Oral Q8H   potassium chloride  20 mEq Oral Daily   sevelamer carbonate  1,600 mg Oral TID WC   sodium bicarbonate  650 mg Oral BID   sodium chloride flush  3 mL Intravenous Q12H   Continuous Infusions: Diet Order             Diet renal with fluid restriction Fluid restriction: 1200 mL Fluid; Room service appropriate? Yes; Fluid consistency: Thin  Diet effective now                   Intake/Output Summary (Last 24 hours) at 02/27/2023 1021 Last data filed at 02/27/2023 1610 Gross per 24 hour  Intake 120 ml  Output 725 ml  Net -605 ml   Net IO Since Admission: -1,635 mL [02/27/23 1021]  Wt Readings from Last 3 Encounters:  02/27/23 85 kg  08/18/22 84.5 kg  06/17/22 91.2 kg     Unresulted Labs (From admission, onward)     Start  Ordered   02/27/23 0500  Basic metabolic panel  Daily,   R      02/26/23 0754   02/27/23 0500  CBC  Daily,   R      02/26/23 0754          Data Reviewed: I have personally reviewed following labs and imaging studies CBC: Recent Labs  Lab 02/25/23 1226 02/26/23 0108 02/27/23 0130  WBC 4.1 4.0 3.8*  NEUTROABS 3.0  --   --   HGB 10.3* 8.8* 8.4*  HCT 34.0* 28.1* 27.0*  MCV 94.2 90.6 91.5  PLT 192 183 172  Basic Metabolic Panel: Recent Labs  Lab 02/25/23 1226 02/26/23 0108 02/27/23 0130  NA 137 140 139  K 3.5 3.4* 3.6  CL 108 111 110  CO2 18* 19* 20*  GLUCOSE 89 88 65*  BUN 79* 79* 79*  CREATININE 3.91* 3.94* 3.92*  CALCIUM 7.2* 7.1* 7.1*  MG  --  2.8*  --    No results found for this or any previous visit (from the past 240 hour(s)).  Antimicrobials: Anti-infectives (From admission, onward)    None      Culture/Microbiology None  Radiology Studies: ECHOCARDIOGRAM COMPLETE  Result Date: 02/26/2023    ECHOCARDIOGRAM REPORT   Patient Name:   MANASA LAROCHE Date of Exam: 02/26/2023 Medical Rec #:  [id number]      Height:        65.0 in Accession #:    [id number]     Weight:       185.2 lb Date of Birth:  [birth year]     BSA:          1.914 m Patient Age:    71 years       BP:           174/79 mmHg Patient Gender: F              HR:           60 bpm. Exam Location:  Inpatient Procedure: 2D Echo, Cardiac Doppler, Color Doppler and Strain Analysis Indications:    CHF- Acute Diastolic  History:        Patient has prior history of Echocardiogram examinations, most                 recent 06/03/2022. CHF; Risk Factors:Hypertension, Dyslipidemia                 and Stage 3 Chronic Kidney Disease.  Sonographer:    Raeford Razor Referring Phys: [id number] CHRISTOPHER L SCHUMANN IMPRESSIONS  1. Left ventricular ejection fraction, by estimation, is 50 to 55%. The left ventricle has low normal function. The left ventricle has no regional wall motion abnormalities. There is severe concentric left ventricular hypertrophy. Left ventricular diastolic function could not be evaluated. The average left ventricular global longitudinal strain is -13.0 %. The global longitudinal strain is abnormal.  2. Right ventricular systolic function is normal. The right ventricular size is normal. Mildly increased right ventricular wall thickness. There is normal pulmonary artery systolic pressure.  3. Left atrial size was moderately dilated.  4. Large pleural effusion in both left and right lateral regions.  5. The mitral valve is abnormal. Mild mitral valve regurgitation. Moderate mitral annular calcification.  6. The aortic valve is tricuspid. There is mild calcification of the aortic valve. There is moderate thickening of the aortic valve. Aortic valve regurgitation is mild. Aortic valve sclerosis/calcification is present, without any evidence of aortic stenosis.  7. The  inferior vena cava is normal in size with <50% respiratory variability, suggesting right atrial pressure of 8 mmHg.  8. Cannot exclude a small PFO. Comparison(s): No significant change from prior study.  Prior images reviewed side by side. Conclusion(s)/Recommendation(s): Consider future nuclear scintigraphy study to rule out cardiac amyloisosis if clinically indicated. FINDINGS  Left Ventricle: No systolic anterior motion of the mitral valve, no LVOT gradient. Abnormal apical to basal strain ratio, 2.5/1. Left ventricular ejection fraction, by estimation, is 50 to 55%. The left ventricle has low normal function. The left ventricle has no regional wall motion abnormalities. The average left ventricular global longitudinal strain is -13.0 %. The global longitudinal strain is abnormal. The left ventricular internal cavity size was normal in size. There is severe concentric left ventricular hypertrophy. Left ventricular diastolic function could not be evaluated due to mitral annular calcification (moderate or greater). Left ventricular diastolic function could not be evaluated. Right Ventricle: The right ventricular size is normal. Mildly increased right ventricular wall thickness. Right ventricular systolic function is normal. There is normal pulmonary artery systolic pressure. The tricuspid regurgitant velocity is 2.08 m/s, and with an assumed right atrial pressure of 8 mmHg, the estimated right ventricular systolic pressure is 25.3 mmHg. Left Atrium: Left atrial size was moderately dilated. Right Atrium: Right atrial size was normal in size. Pericardium: Trivial pericardial effusion is present. The pericardial effusion is posterior to the left ventricle. Mitral Valve: The mitral valve is abnormal. Moderate mitral annular calcification. Mild mitral valve regurgitation. Tricuspid Valve: The tricuspid valve is normal in structure. Tricuspid valve regurgitation is mild . No evidence of tricuspid stenosis. Aortic Valve: The aortic valve is tricuspid. There is mild calcification of the aortic valve. There is moderate thickening of the aortic valve. There is mild aortic valve annular calcification. Aortic valve  regurgitation is mild. Aortic regurgitation PHT  measures 437 msec. Aortic valve sclerosis/calcification is present, without any evidence of aortic stenosis. Aortic valve mean gradient measures 4.0 mmHg. Aortic valve peak gradient measures 6.9 mmHg. Aortic valve area, by VTI measures 1.92 cm. Pulmonic Valve: The pulmonic valve was grossly normal. Pulmonic valve regurgitation is mild. No evidence of pulmonic stenosis. Aorta: The aortic root and ascending aorta are structurally normal, with no evidence of dilitation. Venous: The inferior vena cava is normal in size with less than 50% respiratory variability, suggesting right atrial pressure of 8 mmHg. IAS/Shunts: The interatrial septum appears to be lipomatous. Cannot exclude a small PFO. Additional Comments: There is a large pleural effusion in both left and right lateral regions.  LEFT VENTRICLE PLAX 2D LVIDd:         4.10 cm     Diastology LVIDs:         3.20 cm     LV e' medial:    3.92 cm/s LV PW:         2.10 cm     LV E/e' medial:  22.7 LV IVS:        1.20 cm     LV e' lateral:   5.44 cm/s LVOT diam:     1.90 cm     LV E/e' lateral: 16.3 LV SV:         79 LV SV Index:   41          2D Longitudinal Strain LVOT Area:     2.84 cm    2D Strain GLS Avg:     -13.0 %  LV Volumes (MOD) LV vol d, MOD A2C: 97.2  ml LV vol d, MOD A4C: 91.0 ml LV vol s, MOD A2C: 52.5 ml LV vol s, MOD A4C: 45.7 ml LV SV MOD A2C:     44.7 ml LV SV MOD A4C:     91.0 ml LV SV MOD BP:      43.1 ml RIGHT VENTRICLE             IVC RV Basal diam:  2.80 cm     IVC diam: 1.90 cm RV S prime:     10.20 cm/s TAPSE (M-mode): 2.6 cm LEFT ATRIUM             Index        RIGHT ATRIUM           Index LA diam:        2.55 cm 1.33 cm/m   RA Area:     12.20 cm LA Vol (A2C):   94.9 ml 49.57 ml/m  RA Volume:   31.40 ml  16.40 ml/m LA Vol (A4C):   53.5 ml 27.94 ml/m LA Biplane Vol: 77.6 ml 40.53 ml/m  AORTIC VALVE AV Area (Vmax):    2.34 cm AV Area (Vmean):   2.06 cm AV Area (VTI):     1.92 cm AV Vmax:            131.00 cm/s AV Vmean:          96.500 cm/s AV VTI:            0.413 m AV Peak Grad:      6.9 mmHg AV Mean Grad:      4.0 mmHg LVOT Vmax:         108.00 cm/s LVOT Vmean:        70.000 cm/s LVOT VTI:          0.280 m LVOT/AV VTI ratio: 0.68 AI PHT:            437 msec  AORTA Ao Root diam: 3.10 cm Ao Asc diam:  3.20 cm MITRAL VALVE                TRICUSPID VALVE MV Area (PHT): 3.60 cm     TR Peak grad:   17.3 mmHg MV Decel Time: 211 msec     TR Vmax:        208.00 cm/s MV E velocity: 88.80 cm/s MV A velocity: 109.00 cm/s  SHUNTS MV E/A ratio:  0.81         Systemic VTI:  0.28 m                             Systemic Diam: 1.90 cm Riley Lam MD Electronically signed by Riley Lam MD Signature Date/Time: 02/26/2023/4:31:56 PM    Final    DG Chest Portable 1 View  Result Date: 02/25/2023 CLINICAL DATA:  Shortness of breath EXAM: PORTABLE CHEST 1 VIEW COMPARISON:  X-ray 08/18/2022 FINDINGS: Overlapping cardiac leads. Enlarged cardiopericardial silhouette with calcified tortuous aorta. Persistent bilateral pleural effusions, left-greater-than-right. The right effusion is slightly larger today. No pneumothorax. Central vascular congestion. Curvature of the spine with degenerative changes. Film is rotated to the left. Osteopenia IMPRESSION: Bilateral pleural effusions, left-greater-than-right. The right effusion appears slightly larger today. Enlarged heart with vascular congestion. Electronically Signed   By: Karen Kays M.D.   On: 02/25/2023 12:45     LOS: 2 days   Lanae Boast, MD [facility]  02/27/2023, 10:21 AM

## 2023-02-27 NOTE — Progress Notes (Signed)
Rounding Note    Patient Name: Kara Mullins Date of Encounter: 02/27/2023  [facility] Cardiologist: None   Subjective   Net negative 755cc yesterday.  Cr stable at 3.9.  BP 118/52.  Reports feeling short of breath  Inpatient Medications    Scheduled Meds:  carvedilol  3.125 mg Oral BID WC   Chlorhexidine Gluconate Cloth  6 each Topical Daily   Chlorhexidine Gluconate Cloth  6 each Topical Q0600   furosemide  80 mg Intravenous TID   heparin  5,000 Units Subcutaneous Q8H   hydrALAZINE  50 mg Oral Q8H   potassium chloride  20 mEq Oral Daily   sevelamer carbonate  1,600 mg Oral TID WC   sodium bicarbonate  650 mg Oral BID   sodium chloride flush  3 mL Intravenous Q12H   Continuous Infusions:  PRN Meds: acetaminophen **OR** acetaminophen, albuterol, calcium carbonate (dosed in mg elemental calcium), camphor-menthol **AND** hydrOXYzine, camphor-menthol, docusate sodium, feeding supplement (NEPRO CARB STEADY), hydrALAZINE, hydrocortisone cream, ondansetron **OR** ondansetron (ZOFRAN) IV, sorbitol   Vital Signs    Vitals:   02/26/23 2251 02/27/23 0010 02/27/23 0518 02/27/23 0752  BP: 128/63 (!) 133/52 (!) 151/63 (!) 118/52  Pulse: 66 70 73 76  Resp: 18 18 17 19   Temp: 98.5 F (36.9 C) 98.1 F (36.7 C) 98.8 F (37.1 C) 98.3 F (36.8 C)  TempSrc: Oral Oral Oral Oral  SpO2:  97% 97% 97%  Weight:   85 kg   Height:        Intake/Output Summary (Last 24 hours) at 02/27/2023 0923 Last data filed at 02/27/2023 2536 Gross per 24 hour  Intake 120 ml  Output 725 ml  Net -605 ml       02/27/2023    5:18 AM 02/26/2023    4:25 AM 02/25/2023    5:06 PM  Last 3 Weights  Weight (lbs) 187 lb 6.3 oz 185 lb 3 oz 186 lb 4.6 oz  Weight (kg) 85 kg 84 kg 84.5 kg      Telemetry    NSR - Personally Reviewed  ECG    No new ECG - Personally Reviewed  Physical Exam   GEN: No acute distress.   Neck: + JVD Cardiac: RRR, no murmurs, rubs, or gallops.  Respiratory:  Clear to auscultation bilaterally. GI: Soft, nontender, non-distended  MS: 2+ BLE edema Neuro:  Nonfocal  Psych: Normal affect   Labs    High Sensitivity Troponin:   Recent Labs  Lab 02/25/23 1226 02/25/23 1403  TROPONINIHS 23* 38*      Chemistry Recent Labs  Lab 02/25/23 1226 02/26/23 0108 02/27/23 0130  NA 137 140 139  K 3.5 3.4* 3.6  CL 108 111 110  CO2 18* 19* 20*  GLUCOSE 89 88 65*  BUN 79* 79* 79*  CREATININE 3.91* 3.94* 3.92*  CALCIUM 7.2* 7.1* 7.1*  MG  --  2.8*  --   GFRNONAA 12* 12* 12*  ANIONGAP 11 10 9      Lipids No results for input(s): "CHOL", "TRIG", "HDL", "LABVLDL", "LDLCALC", "CHOLHDL" in the last 168 hours.  Hematology Recent Labs  Lab 02/25/23 1226 02/26/23 0108 02/27/23 0130  WBC 4.1 4.0 3.8*  RBC 3.61* 3.10* 2.95*  HGB 10.3* 8.8* 8.4*  HCT 34.0* 28.1* 27.0*  MCV 94.2 90.6 91.5  MCH 28.5 28.4 28.5  MCHC 30.3 31.3 31.1  RDW 17.6* 17.4* 17.5*  PLT 192 183 172    Thyroid No results for input(s): "TSH", "FREET4" in  the last 168 hours.  BNP Recent Labs  Lab 02/25/23 1226  BNP 1,037.5*     DDimer No results for input(s): "DDIMER" in the last 168 hours.   Radiology    ECHOCARDIOGRAM COMPLETE  Result Date: 02/26/2023    ECHOCARDIOGRAM REPORT   Patient Name:   Kara Mullins Date of Exam: 02/26/2023 Medical Rec #:  [id number]      Height:       65.0 in Accession #:    [id number]     Weight:       185.2 lb Date of Birth:  1951-03-28     BSA:          1.914 m Patient Age:    72 years       BP:           174/79 mmHg Patient Gender: F              HR:           60 bpm. Exam Location:  Inpatient Procedure: 2D Echo, Cardiac Doppler, Color Doppler and Strain Analysis Indications:    CHF- Acute Diastolic  History:        Patient has prior history of Echocardiogram examinations, most                 recent 06/03/2022. CHF; Risk Factors:Hypertension, Dyslipidemia                 and Stage 3 Chronic Kidney Disease.  Sonographer:    Raeford Razor Referring  Phys: [id number] Gregary Blackard L Everlee Quakenbush IMPRESSIONS  1. Left ventricular ejection fraction, by estimation, is 50 to 55%. The left ventricle has low normal function. The left ventricle has no regional wall motion abnormalities. There is severe concentric left ventricular hypertrophy. Left ventricular diastolic function could not be evaluated. The average left ventricular global longitudinal strain is -13.0 %. The global longitudinal strain is abnormal.  2. Right ventricular systolic function is normal. The right ventricular size is normal. Mildly increased right ventricular wall thickness. There is normal pulmonary artery systolic pressure.  3. Left atrial size was moderately dilated.  4. Large pleural effusion in both left and right lateral regions.  5. The mitral valve is abnormal. Mild mitral valve regurgitation. Moderate mitral annular calcification.  6. The aortic valve is tricuspid. There is mild calcification of the aortic valve. There is moderate thickening of the aortic valve. Aortic valve regurgitation is mild. Aortic valve sclerosis/calcification is present, without any evidence of aortic stenosis.  7. The inferior vena cava is normal in size with <50% respiratory variability, suggesting right atrial pressure of 8 mmHg.  8. Cannot exclude a small PFO. Comparison(s): No significant change from prior study. Prior images reviewed side by side. Conclusion(s)/Recommendation(s): Consider future nuclear scintigraphy study to rule out cardiac amyloisosis if clinically indicated. FINDINGS  Left Ventricle: No systolic anterior motion of the mitral valve, no LVOT gradient. Abnormal apical to basal strain ratio, 2.5/1. Left ventricular ejection fraction, by estimation, is 50 to 55%. The left ventricle has low normal function. The left ventricle has no regional wall motion abnormalities. The average left ventricular global longitudinal strain is -13.0 %. The global longitudinal strain is abnormal. The left ventricular  internal cavity size was normal in size. There is severe concentric left ventricular hypertrophy. Left ventricular diastolic function could not be evaluated due to mitral annular calcification (moderate or greater). Left ventricular diastolic function could not be evaluated. Right Ventricle: The right ventricular size is normal. Mildly  increased right ventricular wall thickness. Right ventricular systolic function is normal. There is normal pulmonary artery systolic pressure. The tricuspid regurgitant velocity is 2.08 m/s, and with an assumed right atrial pressure of 8 mmHg, the estimated right ventricular systolic pressure is 25.3 mmHg. Left Atrium: Left atrial size was moderately dilated. Right Atrium: Right atrial size was normal in size. Pericardium: Trivial pericardial effusion is present. The pericardial effusion is posterior to the left ventricle. Mitral Valve: The mitral valve is abnormal. Moderate mitral annular calcification. Mild mitral valve regurgitation. Tricuspid Valve: The tricuspid valve is normal in structure. Tricuspid valve regurgitation is mild . No evidence of tricuspid stenosis. Aortic Valve: The aortic valve is tricuspid. There is mild calcification of the aortic valve. There is moderate thickening of the aortic valve. There is mild aortic valve annular calcification. Aortic valve regurgitation is mild. Aortic regurgitation PHT  measures 437 msec. Aortic valve sclerosis/calcification is present, without any evidence of aortic stenosis. Aortic valve mean gradient measures 4.0 mmHg. Aortic valve peak gradient measures 6.9 mmHg. Aortic valve area, by VTI measures 1.92 cm. Pulmonic Valve: The pulmonic valve was grossly normal. Pulmonic valve regurgitation is mild. No evidence of pulmonic stenosis. Aorta: The aortic root and ascending aorta are structurally normal, with no evidence of dilitation. Venous: The inferior vena cava is normal in size with less than 50% respiratory variability,  suggesting right atrial pressure of 8 mmHg. IAS/Shunts: The interatrial septum appears to be lipomatous. Cannot exclude a small PFO. Additional Comments: There is a large pleural effusion in both left and right lateral regions.  LEFT VENTRICLE PLAX 2D LVIDd:         4.10 cm     Diastology LVIDs:         3.20 cm     LV e' medial:    3.92 cm/s LV PW:         2.10 cm     LV E/e' medial:  22.7 LV IVS:        1.20 cm     LV e' lateral:   5.44 cm/s LVOT diam:     1.90 cm     LV E/e' lateral: 16.3 LV SV:         79 LV SV Index:   41          2D Longitudinal Strain LVOT Area:     2.84 cm    2D Strain GLS Avg:     -13.0 %  LV Volumes (MOD) LV vol d, MOD A2C: 97.2 ml LV vol d, MOD A4C: 91.0 ml LV vol s, MOD A2C: 52.5 ml LV vol s, MOD A4C: 45.7 ml LV SV MOD A2C:     44.7 ml LV SV MOD A4C:     91.0 ml LV SV MOD BP:      43.1 ml RIGHT VENTRICLE             IVC RV Basal diam:  2.80 cm     IVC diam: 1.90 cm RV S prime:     10.20 cm/s TAPSE (M-mode): 2.6 cm LEFT ATRIUM             Index        RIGHT ATRIUM           Index LA diam:        2.55 cm 1.33 cm/m   RA Area:     12.20 cm LA Vol (A2C):   94.9 ml 49.57 ml/m  RA Volume:  31.40 ml  16.40 ml/m LA Vol (A4C):   53.5 ml 27.94 ml/m LA Biplane Vol: 77.6 ml 40.53 ml/m  AORTIC VALVE AV Area (Vmax):    2.34 cm AV Area (Vmean):   2.06 cm AV Area (VTI):     1.92 cm AV Vmax:           131.00 cm/s AV Vmean:          96.500 cm/s AV VTI:            0.413 m AV Peak Grad:      6.9 mmHg AV Mean Grad:      4.0 mmHg LVOT Vmax:         108.00 cm/s LVOT Vmean:        70.000 cm/s LVOT VTI:          0.280 m LVOT/AV VTI ratio: 0.68 AI PHT:            437 msec  AORTA Ao Root diam: 3.10 cm Ao Asc diam:  3.20 cm MITRAL VALVE                TRICUSPID VALVE MV Area (PHT): 3.60 cm     TR Peak grad:   17.3 mmHg MV Decel Time: 211 msec     TR Vmax:        208.00 cm/s MV E velocity: 88.80 cm/s MV A velocity: 109.00 cm/s  SHUNTS MV E/A ratio:  0.81         Systemic VTI:  0.28 m                              Systemic Diam: 1.90 cm Riley Lam MD Electronically signed by Riley Lam MD Signature Date/Time: 02/26/2023/4:31:56 PM    Final    DG Chest Portable 1 View  Result Date: 02/25/2023 CLINICAL DATA:  Shortness of breath EXAM: PORTABLE CHEST 1 VIEW COMPARISON:  X-ray 08/18/2022 FINDINGS: Overlapping cardiac leads. Enlarged cardiopericardial silhouette with calcified tortuous aorta. Persistent bilateral pleural effusions, left-greater-than-right. The right effusion is slightly larger today. No pneumothorax. Central vascular congestion. Curvature of the spine with degenerative changes. Film is rotated to the left. Osteopenia IMPRESSION: Bilateral pleural effusions, left-greater-than-right. The right effusion appears slightly larger today. Enlarged heart with vascular congestion. Electronically Signed   By: Karen Kays M.D.   On: 02/25/2023 12:45    Cardiac Studies     Patient Profile     72 y.o. female with a hx of chronic combined systolic and diastolic heart failure, CKD stage IV, hypertension, lymphedema who is being seen 02/25/2023 for the evaluation of heart failure   Assessment & Plan    Acute on chronic diastolic heart failure: Presented with shortness of breath and worsening lower extremity edema.  Appears volume overloaded on exam.  Echo shows EF 50 to 55%, severe LVH, normal RV function, mild MR, mild AI -Continue diuresis per nephrology. Currently on IV lasix 80 mg TID   CKD stage IV-V: Creatinine 3.9, has varied from 3.0-6.5 over the last 6 months.  Will need to closely monitor renal function with diuresis.  Nephrology consulted.   Hypertension: On hydralazine 50 mg 3 times daily and coreg 3.125 mg BID.  Appears controlled  For questions or updates, please contact [facility] Please consult [email or web address] for contact info under        Signed, [name], MD  02/27/2023, 9:23 AM

## 2023-02-27 NOTE — Consult Note (Signed)
Consultation Note Date: 02/27/2023   Patient Name: Kara Mullins  DOB: 07/05/1951  MRN: [id number]  Age / Sex: 72 y.o., female  PCP: [name], [name], NP Referring Physician: Lanae Boast, MD  Reason for Consultation: Establishing goals of care  HPI/Patient Profile: 72 y.o. female   admitted on 02/25/2023 with past  medical history significant of chronic combined CHF, GAD, HTN, HLD, and stage 3b CKD presenting with SOB.  She went in for Retacrit injection on 3/14 and was given blood transfusion.  Her fluid balance has seemed off since.  She had been on torsemide but the dose was decreased because her renal function worsened.  She restarted the torsemide at 40 mg BID (instead of 80) but  her edema has worsened and her daughter noticed pitting edema in her flanks today.  No chest pain.   She is currently hypothermic and on a Lawyer.  Her daughter reports that this is common for her, denies recent illness or infectious symptoms.   She is non-ambulatory, last walked maybe a year ago.  Her LE edema and deconditioning plus OA pain in her R knee keep her from ambulating.  Family face treatment option decision, advanced directive decisions and anticipatory care needs.    Clinical Assessment and Goals of Care:  This NP Lorinda Creed reviewed medical records, received report from team, assessed the patient and then meet at the patient's bedside and then spoke by telephone with her daughter/ Corinna Capra to discuss diagnosis, prognosis, GOC, EOL wishes disposition and options.   Concept of Palliative Care was introduced as specialized medical care for people and their families living with serious illness.  If focuses on providing relief from the symptoms and stress of a serious illness.  The goal is to improve quality of life for both the patient and the family.  Values and goals of care important to patient and family  were attempted to be elicited.  Created space and opportunity for patient  and family to explore thoughts and feelings regarding current medical situation. Patient's daughter and her husband live with patient in her home.  Daughter is main support person and caregiver.   Both verbalize continued physical and functional decline over the past many months   A  discussion was had today regarding advanced directives.  Concepts specific to code status, artifical feeding and hydration, continued IV antibiotics and rehospitalization was had.    The difference between a aggressive medical intervention path  and a palliative comfort care path for this patient at this time was had.    MOST form re-addressed,  MOST form completed in [facility] 01/2022.  She has been seen in her home by an OP Palliative program.      Encouraged further conversation to clarify GOCs within the context of multiple co-morbidities.   At this time patient and her family are open to all offered and available medical interventions to prolong life.     Questions and concerns addressed.  Patient/family  encouraged to call with questions or concerns.  PMT will continue to support holistically.             HCPOA    SUMMARY OF RECOMMENDATIONS    Code Status/Advance Care Planning: Full code Educated patient/family to consider DNR/DNI status understanding evidenced based poor outcomes in similar hospitalized patient, as the cause of arrest is likely associated with advanced chronic illness rather than an easily reversible acute cardio-pulmonary event.   Palliative Prophylaxis:  Delirium Protocol and Frequent Pain Assessment  Additional Recommendations (Limitations, Scope, Preferences): Full Scope Treatment  Psycho-social/Spiritual:  Desire for further Chaplaincy support:no-declined   Prognosis:  Unable to determine  Discharge Planning: To Be Determined      Primary Diagnoses: Present on Admission:   Hyperlipidemia  Essential hypertension  CKD (chronic kidney disease) stage 5, GFR less than 15 ml/min (HCC)   I have reviewed the medical record, interviewed the patient and family, and examined the patient. The following aspects are pertinent.  Past Medical History:  Diagnosis Date   Chronic combined systolic and diastolic congestive heart failure (HCC) 11/09/2021   Generalized anxiety disorder    Hyperlipidemia 11/09/2021   Hypertension    Stage 3b chronic kidney disease (CKD) (HCC) 11/09/2021   Social History   Socioeconomic History   Marital status: Divorced    Spouse name: Not on file   Number of children: 1   Years of education: Not on file   Highest education level: Not on file  Occupational History   Occupation: retired  Tobacco Use   Smoking status: Never   Smokeless tobacco: Never  Vaping Use   Vaping Use: Never used  Substance and Sexual Activity   Alcohol use: Not Currently   Drug use: Never   Sexual activity: Not Currently  Other Topics Concern   Not on file  Social History Narrative   ** Merged History Encounter **       Tobacco use, amount per day now: N/A Past tobacco use, amount per day: N/A How many years did you use tobacco: N/A Alcohol use (drinks per week): N/A Diet: Do you drink/eat things with caffeine: Coffee Marital status: Divorced                                    What year were you married? 1992 Do you live in a house, apartment, assisted living, condo, trailer, etc.? Is it one or more stories? How many persons live in your home? 2 Do you have pets in your home?( please list) No Highest Level of e   ducation completed? High School Current or past profession: Restaurant Employee Do you exercise?   Yes                               Type and how often? Walk Do you have a living will? No Do you have a DNR form?    No                               If    not, do you want to discuss one? Do you have signed POA/HPOA forms? No                        If so, please bring to you appointment  Do you have any difficulty bathing or  dressing yourself? No Do you have any difficulty preparing food or eating? No    Do you have any difficulty managing your medications? No Do you have any difficulty managing your finances? No Do you have any difficulty affording your medications? No   Social Determinants of Health   Financial Resource Strain: Low Risk  (06/11/2022)   Overall Financial Resource Strain (CARDIA)    Difficulty of Paying Living Expenses: Not very hard  Food Insecurity: No Food Insecurity (02/26/2023)   Hunger Vital Sign    Worried About Running Out of Food in the Last Year: Never true    Ran Out of Food in the Last Year: Never true  Transportation Needs: No Transportation Needs (02/26/2023)   PRAPARE - Administrator, Civil Service (Medical): No    Lack of Transportation (Non-Medical): No  Physical Activity: Not on file  Stress: Not on file  Social Connections: Not on file   Family History  Problem Relation Age of Onset   Throat cancer Father    Scheduled Meds:  carvedilol  3.125 mg Oral BID WC   Chlorhexidine Gluconate Cloth  6 each Topical Daily   Chlorhexidine Gluconate Cloth  6 each Topical Q0600   furosemide  80 mg Intravenous TID   heparin  5,000 Units Subcutaneous Q8H   hydrALAZINE  50 mg Oral Q8H   potassium chloride  20 mEq Oral Daily   sevelamer carbonate  1,600 mg Oral TID WC   sodium bicarbonate  650 mg Oral BID   sodium chloride flush  3 mL Intravenous Q12H   Continuous Infusions: PRN Meds:.acetaminophen **OR** acetaminophen, albuterol, calcium carbonate (dosed in mg elemental calcium), camphor-menthol **AND** hydrOXYzine, camphor-menthol, docusate sodium, feeding supplement (NEPRO CARB STEADY), hydrALAZINE, hydrocortisone cream, ondansetron **OR** ondansetron (ZOFRAN) IV, sorbitol Medications Prior to Admission:  Prior to Admission medications   Medication Sig Start Date End Date  Taking? Authorizing Provider  carvedilol (COREG) 3.125 MG tablet Take 3.125 mg by mouth 2 (two) times daily with a meal.   Yes [provider]  cholecalciferol (VITAMIN D3) 25 MCG (1000 UNIT) tablet Take 1,000 Units by mouth daily.   Yes [provider]  Epoetin Alfa-epbx (RETACRIT IJ) Inject 20,000 Units as directed every 14 (fourteen) days.   Yes [provider]  feeding supplement (ENSURE ENLIVE / ENSURE PLUS) LIQD Take 237 mLs by mouth 2 (two) times daily between meals. 06/17/22  Yes Berton Mount I, MD  hydrALAZINE (APRESOLINE) 50 MG tablet TAKE 1 TABLET(50 MG) BY MOUTH EVERY 8 HOURS Patient taking differently: Take 25 mg by mouth in the morning and at bedtime. 11/21/22  Yes [name], [name], NP  Menthol-Methyl Salicylate (MUSCLE RUB) 10-15 % CREA Apply 1 Application topically daily as needed for muscle pain.   Yes [provider]  Multiple Vitamin (MULTIVITAMIN ADULT PO) Take 1 tablet by mouth daily.   Yes [provider]  sevelamer carbonate (RENVELA) 800 MG tablet Take 800 mg by mouth every evening. 07/10/22  Yes [provider]  sodium bicarbonate 650 MG tablet Take 650 mg by mouth 2 (two) times daily.   Yes [provider]  Torsemide 40 MG TABS Take 1 tablet by mouth daily.   Yes [provider]   Allergies  Allergen Reactions   Crestor [Rosuvastatin] Other (See Comments)    "Felt like she couldn't swallow"   Nitrofuran Derivatives    Review of Systems  Cardiovascular:  Positive for leg swelling.  Neurological:  Positive for weakness.  Physical Exam Cardiovascular:     Rate and Rhythm: Normal rate.  Pulmonary:     Effort: Pulmonary effort is normal.  Musculoskeletal:     Right lower leg: Edema present.     Left lower leg: Edema present.  Skin:    General: Skin is warm and dry.  Neurological:     Mental Status: She is alert.   Vital Signs: BP (!) 118/52   Pulse 76   Temp 98.3 F (36.8 C) (Oral)    Resp 19   Ht 5\' 5"  (1.651 m)   Wt 85 kg   SpO2 97%   BMI 31.18 kg/m  Pain Scale: 0-10   Pain Score: 0-No pain   SpO2: SpO2: 97 % O2 Device:SpO2: 97 % O2 Flow Rate: .   IO: Intake/output summary:  Intake/Output Summary (Last 24 hours) at 02/27/2023 0853 Last data filed at 02/27/2023 0981 Gross per 24 hour  Intake 240 ml  Output 875 ml  Net -635 ml    LBM: Last BM Date : 02/25/23 Baseline Weight: Weight: 84.5 kg Most recent weight: Weight: 85 kg     Palliative Assessment/Data:  30 %      Time 75 minutes  Signed by: Lorinda Creed, NP   Please contact Palliative Medicine Team phone at [phone] for questions and concerns.  For individual provider: See Loretha Stapler

## 2023-02-28 ENCOUNTER — Inpatient Hospital Stay (HOSPITAL_COMMUNITY): Payer: Medicare (Managed Care)

## 2023-02-28 DIAGNOSIS — M7989 Other specified soft tissue disorders: Secondary | ICD-10-CM | POA: Diagnosis not present

## 2023-02-28 DIAGNOSIS — I5033 Acute on chronic diastolic (congestive) heart failure: Secondary | ICD-10-CM | POA: Diagnosis not present

## 2023-02-28 DIAGNOSIS — N184 Chronic kidney disease, stage 4 (severe): Secondary | ICD-10-CM | POA: Diagnosis not present

## 2023-02-28 DIAGNOSIS — I10 Essential (primary) hypertension: Secondary | ICD-10-CM | POA: Diagnosis not present

## 2023-02-28 LAB — BASIC METABOLIC PANEL
Anion gap: 10 (ref 5–15)
BUN: 81 mg/dL — ABNORMAL HIGH (ref 8–23)
CO2: 18 mmol/L — ABNORMAL LOW (ref 22–32)
Calcium: 6.9 mg/dL — ABNORMAL LOW (ref 8.9–10.3)
Chloride: 109 mmol/L (ref 98–111)
Creatinine, Ser: 3.98 mg/dL — ABNORMAL HIGH (ref 0.44–1.00)
GFR, Estimated: 12 mL/min — ABNORMAL LOW (ref >60)
Glucose, Bld: 82 mg/dL (ref 70–99)
Potassium: 3.9 mmol/L (ref 3.5–5.1)
Sodium: 137 mmol/L (ref 135–145)

## 2023-02-28 LAB — CBC
HCT: 26.7 % — ABNORMAL LOW (ref 36.0–46.0)
Hemoglobin: 8.2 g/dL — ABNORMAL LOW (ref 12.0–15.0)
MCH: 27.8 pg (ref 26.0–34.0)
MCHC: 30.7 g/dL (ref 30.0–36.0)
MCV: 90.5 fL (ref 80.0–100.0)
Platelets: 167 10*3/uL (ref 150–400)
RBC: 2.95 MIL/uL — ABNORMAL LOW (ref 3.87–5.11)
RDW: 17.8 % — ABNORMAL HIGH (ref 11.5–15.5)
WBC: 4.1 10*3/uL (ref 4.0–10.5)
nRBC: 0 % (ref 0.0–0.2)

## 2023-02-28 MED ORDER — DARBEPOETIN ALFA 150 MCG/0.3ML IJ SOSY
150.0000 ug | PREFILLED_SYRINGE | Freq: Once | INTRAMUSCULAR | Status: AC
  Administered 2023-02-28: 150 ug via SUBCUTANEOUS
  Filled 2023-02-28: qty 0.3

## 2023-02-28 NOTE — Care Management Important Message (Signed)
Important Message  Patient Details  Name: Kara Mullins MRN: [id number] Date of Birth: [birth year]   Medicare Important Message Given:  Yes     Renie Ora 02/28/2023, 8:16 AM

## 2023-02-28 NOTE — Progress Notes (Signed)
Rounding Note    Patient Name: Kara Mullins Date of Encounter: 02/28/2023  [facility] Cardiologist: None   Subjective   Net negative 129cc yesterday.  Cr stable at 3.98.  BP 158/67.  Reports dyspnea has improved but not back to baseline  Inpatient Medications    Scheduled Meds:  carvedilol  3.125 mg Oral BID WC   Chlorhexidine Gluconate Cloth  6 each Topical Daily   Chlorhexidine Gluconate Cloth  6 each Topical Q0600   folic acid  1 mg Oral Daily   furosemide  80 mg Intravenous TID   heparin  5,000 Units Subcutaneous Q8H   hydrALAZINE  50 mg Oral Q8H   iron polysaccharides  150 mg Oral Daily   sevelamer carbonate  1,600 mg Oral TID WC   sodium bicarbonate  650 mg Oral BID   sodium chloride flush  3 mL Intravenous Q12H   Continuous Infusions:  PRN Meds: acetaminophen **OR** acetaminophen, albuterol, calcium carbonate (dosed in mg elemental calcium), camphor-menthol **AND** hydrOXYzine, camphor-menthol, docusate sodium, feeding supplement (NEPRO CARB STEADY), hydrALAZINE, hydrocortisone cream, ondansetron **OR** ondansetron (ZOFRAN) IV, sorbitol   Vital Signs    Vitals:   02/27/23 2314 02/28/23 0500 02/28/23 0539 02/28/23 0729  BP: (!) 142/56  (!) 158/68 (!) 158/67  Pulse: 65  65 72  Resp: 18  18   Temp: 97.6 F (36.4 C) 97.8 F (36.6 C) 97.8 F (36.6 C) 97.8 F (36.6 C)  TempSrc: Oral Oral Oral Oral  SpO2: 98%  98% 96%  Weight:   85 kg   Height:        Intake/Output Summary (Last 24 hours) at 02/28/2023 0905 Last data filed at 02/28/2023 0600 Gross per 24 hour  Intake 676 ml  Output 925 ml  Net -249 ml       02/28/2023    5:39 AM 02/27/2023    5:18 AM 02/26/2023    4:25 AM  Last 3 Weights  Weight (lbs) 187 lb 6.3 oz 187 lb 6.3 oz 185 lb 3 oz  Weight (kg) 85 kg 85 kg 84 kg      Telemetry    NSR - Personally Reviewed  ECG    No new ECG - Personally Reviewed  Physical Exam   GEN: No acute distress.   Neck: + JVD Cardiac: RRR, no  murmurs, rubs, or gallops.  Respiratory: Clear to auscultation bilaterally. GI: Soft, nontender, non-distended  MS: 2+ BLE edema Neuro:  Nonfocal  Psych: Normal affect   Labs    High Sensitivity Troponin:   Recent Labs  Lab 02/25/23 1226 02/25/23 1403  TROPONINIHS 23* 38*      Chemistry Recent Labs  Lab 02/26/23 0108 02/27/23 0130 02/28/23 0048  NA 140 139 137  K 3.4* 3.6 3.9  CL 111 110 109  CO2 19* 20* 18*  GLUCOSE 88 65* 82  BUN 79* 79* 81*  CREATININE 3.94* 3.92* 3.98*  CALCIUM 7.1* 7.1* 6.9*  MG 2.8*  --   --   GFRNONAA 12* 12* 12*  ANIONGAP 10 9 10      Lipids No results for input(s): "CHOL", "TRIG", "HDL", "LABVLDL", "LDLCALC", "CHOLHDL" in the last 168 hours.  Hematology Recent Labs  Lab 02/26/23 0108 02/27/23 0130 02/28/23 0048  WBC 4.0 3.8* 4.1  RBC 3.10* 2.95* 2.95*  HGB 8.8* 8.4* 8.2*  HCT 28.1* 27.0* 26.7*  MCV 90.6 91.5 90.5  MCH 28.4 28.5 27.8  MCHC 31.3 31.1 30.7  RDW 17.4* 17.5* 17.8*  PLT 183  172 167    Thyroid No results for input(s): "TSH", "FREET4" in the last 168 hours.  BNP Recent Labs  Lab 02/25/23 1226  BNP 1,037.5*     DDimer No results for input(s): "DDIMER" in the last 168 hours.   Radiology    ECHOCARDIOGRAM COMPLETE  Result Date: 02/26/2023    ECHOCARDIOGRAM REPORT   Patient Name:   TAYTUM LEGACY Date of Exam: 02/26/2023 Medical Rec #:  [id number]      Height:       65.0 in Accession #:    [id number]     Weight:       185.2 lb Date of Birth:  1951/08/26     BSA:          1.914 m Patient Age:    71 years       BP:           174/79 mmHg Patient Gender: F              HR:           60 bpm. Exam Location:  Inpatient Procedure: 2D Echo, Cardiac Doppler, Color Doppler and Strain Analysis Indications:    CHF- Acute Diastolic  History:        Patient has prior history of Echocardiogram examinations, most                 recent 06/03/2022. CHF; Risk Factors:Hypertension, Dyslipidemia                 and Stage 3 Chronic Kidney  Disease.  Sonographer:    Raeford Razor Referring Phys: [id number] Myishia Kasik L Gedeon Brandow IMPRESSIONS  1. Left ventricular ejection fraction, by estimation, is 50 to 55%. The left ventricle has low normal function. The left ventricle has no regional wall motion abnormalities. There is severe concentric left ventricular hypertrophy. Left ventricular diastolic function could not be evaluated. The average left ventricular global longitudinal strain is -13.0 %. The global longitudinal strain is abnormal.  2. Right ventricular systolic function is normal. The right ventricular size is normal. Mildly increased right ventricular wall thickness. There is normal pulmonary artery systolic pressure.  3. Left atrial size was moderately dilated.  4. Large pleural effusion in both left and right lateral regions.  5. The mitral valve is abnormal. Mild mitral valve regurgitation. Moderate mitral annular calcification.  6. The aortic valve is tricuspid. There is mild calcification of the aortic valve. There is moderate thickening of the aortic valve. Aortic valve regurgitation is mild. Aortic valve sclerosis/calcification is present, without any evidence of aortic stenosis.  7. The inferior vena cava is normal in size with <50% respiratory variability, suggesting right atrial pressure of 8 mmHg.  8. Cannot exclude a small PFO. Comparison(s): No significant change from prior study. Prior images reviewed side by side. Conclusion(s)/Recommendation(s): Consider future nuclear scintigraphy study to rule out cardiac amyloisosis if clinically indicated. FINDINGS  Left Ventricle: No systolic anterior motion of the mitral valve, no LVOT gradient. Abnormal apical to basal strain ratio, 2.5/1. Left ventricular ejection fraction, by estimation, is 50 to 55%. The left ventricle has low normal function. The left ventricle has no regional wall motion abnormalities. The average left ventricular global longitudinal strain is -13.0 %. The global  longitudinal strain is abnormal. The left ventricular internal cavity size was normal in size. There is severe concentric left ventricular hypertrophy. Left ventricular diastolic function could not be evaluated due to mitral annular calcification (moderate or greater). Left ventricular diastolic function  could not be evaluated. Right Ventricle: The right ventricular size is normal. Mildly increased right ventricular wall thickness. Right ventricular systolic function is normal. There is normal pulmonary artery systolic pressure. The tricuspid regurgitant velocity is 2.08 m/s, and with an assumed right atrial pressure of 8 mmHg, the estimated right ventricular systolic pressure is 25.3 mmHg. Left Atrium: Left atrial size was moderately dilated. Right Atrium: Right atrial size was normal in size. Pericardium: Trivial pericardial effusion is present. The pericardial effusion is posterior to the left ventricle. Mitral Valve: The mitral valve is abnormal. Moderate mitral annular calcification. Mild mitral valve regurgitation. Tricuspid Valve: The tricuspid valve is normal in structure. Tricuspid valve regurgitation is mild . No evidence of tricuspid stenosis. Aortic Valve: The aortic valve is tricuspid. There is mild calcification of the aortic valve. There is moderate thickening of the aortic valve. There is mild aortic valve annular calcification. Aortic valve regurgitation is mild. Aortic regurgitation PHT  measures 437 msec. Aortic valve sclerosis/calcification is present, without any evidence of aortic stenosis. Aortic valve mean gradient measures 4.0 mmHg. Aortic valve peak gradient measures 6.9 mmHg. Aortic valve area, by VTI measures 1.92 cm. Pulmonic Valve: The pulmonic valve was grossly normal. Pulmonic valve regurgitation is mild. No evidence of pulmonic stenosis. Aorta: The aortic root and ascending aorta are structurally normal, with no evidence of dilitation. Venous: The inferior vena cava is normal in  size with less than 50% respiratory variability, suggesting right atrial pressure of 8 mmHg. IAS/Shunts: The interatrial septum appears to be lipomatous. Cannot exclude a small PFO. Additional Comments: There is a large pleural effusion in both left and right lateral regions.  LEFT VENTRICLE PLAX 2D LVIDd:         4.10 cm     Diastology LVIDs:         3.20 cm     LV e' medial:    3.92 cm/s LV PW:         2.10 cm     LV E/e' medial:  22.7 LV IVS:        1.20 cm     LV e' lateral:   5.44 cm/s LVOT diam:     1.90 cm     LV E/e' lateral: 16.3 LV SV:         79 LV SV Index:   41          2D Longitudinal Strain LVOT Area:     2.84 cm    2D Strain GLS Avg:     -13.0 %  LV Volumes (MOD) LV vol d, MOD A2C: 97.2 ml LV vol d, MOD A4C: 91.0 ml LV vol s, MOD A2C: 52.5 ml LV vol s, MOD A4C: 45.7 ml LV SV MOD A2C:     44.7 ml LV SV MOD A4C:     91.0 ml LV SV MOD BP:      43.1 ml RIGHT VENTRICLE             IVC RV Basal diam:  2.80 cm     IVC diam: 1.90 cm RV S prime:     10.20 cm/s TAPSE (M-mode): 2.6 cm LEFT ATRIUM             Index        RIGHT ATRIUM           Index LA diam:        2.55 cm 1.33 cm/m   RA Area:     12.20 cm LA  Vol [facility]):   94.9 ml 49.57 ml/m  RA Volume:   31.40 ml  16.40 ml/m LA Vol (A4C):   53.5 ml 27.94 ml/m LA Biplane Vol: 77.6 ml 40.53 ml/m  AORTIC VALVE AV Area (Vmax):    2.34 cm AV Area (Vmean):   2.06 cm AV Area (VTI):     1.92 cm AV Vmax:           131.00 cm/s AV Vmean:          96.500 cm/s AV VTI:            0.413 m AV Peak Grad:      6.9 mmHg AV Mean Grad:      4.0 mmHg LVOT Vmax:         108.00 cm/s LVOT Vmean:        70.000 cm/s LVOT VTI:          0.280 m LVOT/AV VTI ratio: 0.68 AI PHT:            437 msec  AORTA Ao Root diam: 3.10 cm Ao Asc diam:  3.20 cm MITRAL VALVE                TRICUSPID VALVE MV Area (PHT): 3.60 cm     TR Peak grad:   17.3 mmHg MV Decel Time: 211 msec     TR Vmax:        208.00 cm/s MV E velocity: 88.80 cm/s MV A velocity: 109.00 cm/s  SHUNTS MV E/A ratio:  0.81          Systemic VTI:  0.28 m                             Systemic Diam: 1.90 cm Riley Lam MD Electronically signed by Riley Lam MD Signature Date/Time: 02/26/2023/4:31:56 PM    Final     Cardiac Studies    Patient Profile     72 y.o. female with a hx of chronic combined systolic and diastolic heart failure, CKD stage IV, hypertension, lymphedema who is being seen 02/25/2023 for the evaluation of heart failure   Assessment & Plan    Acute on chronic diastolic heart failure: Presented with shortness of breath and worsening lower extremity edema.  Appears volume overloaded on exam.  Echo shows EF 50 to 55%, severe LVH, normal RV function, mild MR, mild AI -Reports improvement in symptoms with diuresis.  Remains volume overloaded on exam.  Continue diuresis per nephrology. Currently on IV lasix 80 mg TID   CKD stage IV-V: Creatinine 3.98, has varied from 3.0-6.5 over the last 6 months.  Will need to closely monitor renal function with diuresis.  Nephrology consulted.   Hypertension: On hydralazine 50 mg 3 times daily and coreg 3.125 mg BID.  Appears controlled  For questions or updates, please contact [facility] Please consult [email or web address] for contact info under        Signed, [name], MD  02/28/2023, 9:05 AM

## 2023-02-28 NOTE — Progress Notes (Signed)
PROGRESS NOTE Deniyah Patchin  ZOX:[id number] DOB: 06-23-1951 DOA: 02/25/2023 PCP: Caesar Bookman, NP  Brief Narrative/Hospital Course: 72 yof w/ chronic diastolic CHF w/ lvef 55%,GAD, HTN, HLD,Obseity, lymphedema and stage IV b/l bun/creat 60/3.0 (01/22/23) previously in 90s/5 presented with shortness of breath, worsening edema. She went in for Retacrit injection on 3/14 and was given blood transfusion.  Her fluid balance has seemed off since.  She had been on torsemide but the dose was decreased because her renal function worsened.  She restarted the torsemide at 40 mg BID (instead of 80) but  her edema has worsened and her daughter noticed pitting edema in her flanks In the ED hypertensive 180-190, labs showed BNP 1037 troponin 23>, hemoglobin 10.3 g, BUN 79 creatinine 3.9. Chest x-ray bilateral pleural effusion left greater than right right effusion appears slightly larger compared to x-ray 08/18/2022. Patient was given IV Lasix and admitted for acute on chronic CHF-cardiology and nephrology consulted.     Subjective: Seen and examined this morning Reports swelling is improving, still has some orthopnea but better from before Overnight afebrile BP stable/borderline controlled in 140s to 150s, on room air  Labs showed a stable creatinine 3.9 hemoglobin 8.2 g previously 10 g. Wt at [phone]   Assessment and Plan: Principal Problem:   Acute on chronic diastolic CHF (congestive heart failure) (HCC) Active Problems:   CKD (chronic kidney disease) stage 5, GFR less than 15 ml/min (HCC)   Essential hypertension   Hyperlipidemia   Chronic kidney disease (CKD), stage IV (severe) (HCC)   Acute on chronic diastolic heart failure: Lvef 50-55% w/ severe LVH, no WMA presenting w/ worsening of shortness of breath and extensive lower leg edema. Cardiology nephrology following, appreciate inputs. Continue with IV lasix 80 mg tid per nephro. Weight remains same but leg swelling improving creatinine  remains elevated but stable  Cont to monitor daily I/O, daly weight, electrolytes and net balance as below. Keep on 2 gm salt and fluid restricted diet. Net IO Since Admission: -1,884 mL [02/28/23 1017]  Filed Weights   02/26/23 0425 02/27/23 0518 02/28/23 0539  Weight: 84 kg 85 kg 85 kg    Recent Labs  Lab 02/25/23 1226 02/26/23 0108 02/27/23 0130 02/28/23 0048  BNP 1,037.5*  --   --   --   BUN 79* 79* 79* 81*  CREATININE 3.91* 3.94* 3.92* 3.98*  K 3.5 3.4* 3.6 3.9  MG  --  2.8*  --   --     Hypokalemia-resolved. Dc kdur  CKD stage IV-V:bun/creat 60/3.0 (01/22/23) -creatinine remains stable.  Continue to monitor while on diuretics  Metabolic acidosis in the setting of CKD monitor bicarb Recent Labs    08/17/22 0055 08/18/22 0046 08/23/22 0000 10/04/22 1249 10/29/22 0000 01/22/23 0000 02/25/23 1226 02/26/23 0108 02/27/23 0130 02/28/23 0048  BUN 106* 106* 106* 126* 92* 60* 79* 79* 79* 81*  CREATININE 5.39* 5.12* 5.5* 6.53* 4.3* 3.0* 3.91* 3.94* 3.92* 3.98*  CO2 18* 17* 16 19* 15 18 18* 19* 20* 18*    Metabolic bone disease: continue Renvela  Anemia of chronic renal disease hemoglobin appears to be close to baseline or slightly lower 8 to 10 mg.Previously as low as 6.3 g with 12/26/2022 needing blood transfusion.  Continue folic acid, iron supplement.  Daughter requesting Procrit injection, ms sent to Nephro Recent Labs  Lab 02/25/23 1226 02/26/23 0108 02/27/23 0130 02/28/23 0048  HGB 10.3* 8.8* 8.4* 8.2*  HCT 34.0* 28.1* 27.0* 26.7*    Hypertension: Uncontrolled  in the ED. fairly controlled currently on hydralazine 50 tid and Coreg low-dose.    Class I obesity with BMI 30.8 will benefit weight loss, sleep apnea evaluation  Sacral Pressure ulcer poa see below: Pressure Injury 03/28/22 Sacrum Mid Stage 1 -  Intact skin with non-blanchable redness of a localized area usually over a bony prominence. Area is darker and non-blanchable. Intact, clean, dry. (Active)   03/28/22 0126  Location: Sacrum  Location Orientation: Mid  Staging: Stage 1 -  Intact skin with non-blanchable redness of a localized area usually over a bony prominence.  Wound Description (Comments): Area is darker and non-blanchable. Intact, clean, dry.  Present on Admission: Yes  Dressing Type Foam - Lift dressing to assess site every shift 02/25/23 2005   Class I Obesity:Patient's Body mass index is 31.18 kg/m. : Will benefit with PCP follow-up, weight loss  healthy lifestyle and outpatient sleep evaluation.  Goals of care: Palliative care has been consulted currently full code-family has been educated to consider DNR/DNI understanding evidenced-based poor outcomes in similar hospitalized ptient, as a cause of arrest is likely associated with advanced chronic illness rather than easily reversible acute cardiopulmonary.  Has complex medical comorbidities.   DVT prophylaxis: heparin injection 5,000 Units Start: 02/25/23 1615 Code Status:   Code Status: Full Code Family Communication: plan of care discussed with patient at bedside. Patient status is: Inpatient because of CHF Level of care: Telemetry Cardiac   Dispo: The patient is from: HOME lives with daughter            Anticipated disposition: home > 2-3 days once swelling improves Objective: Vitals last 24 hrs: Vitals:   02/27/23 2314 02/28/23 0500 02/28/23 0539 02/28/23 0729  BP: (!) 142/56  (!) 158/68 (!) 158/67  Pulse: 65  65 72  Resp: 18  18   Temp: 97.6 F (36.4 C) 97.8 F (36.6 C) 97.8 F (36.6 C) 97.8 F (36.6 C)  TempSrc: Oral Oral Oral Oral  SpO2: 98%  98% 96%  Weight:   85 kg   Height:       Weight change: 0 kg  Physical Examination: General exam: alert awake, pleasant, elderly HEENT:Oral mucosa moist, Ear/Nose WNL grossly Respiratory system: Bilaterally clear BS, no use of accessory muscle Cardiovascular system: S1 & S2 +, No JVD. Gastrointestinal system: Abdomen soft,NT,ND, BS+ Nervous System: Alert,  awake, moving extremities, she follows commands. Extremities: LE edema ++ upto thigh but improving compared to previous Skin: No rashes,no icterus. MSK: weak/small muscle bulk,tone, power  Medications reviewed:  Scheduled Meds:  carvedilol  3.125 mg Oral BID WC   Chlorhexidine Gluconate Cloth  6 each Topical Daily   Chlorhexidine Gluconate Cloth  6 each Topical Q0600   folic acid  1 mg Oral Daily   furosemide  80 mg Intravenous TID   heparin  5,000 Units Subcutaneous Q8H   hydrALAZINE  50 mg Oral Q8H   iron polysaccharides  150 mg Oral Daily   sevelamer carbonate  1,600 mg Oral TID WC   sodium bicarbonate  650 mg Oral BID   sodium chloride flush  3 mL Intravenous Q12H   Continuous Infusions: Diet Order             Diet renal with fluid restriction Fluid restriction: 1200 mL Fluid; Room service appropriate? Yes; Fluid consistency: Thin  Diet effective now                   Intake/Output Summary (Last 24 hours) at 02/28/2023  1017 Last data filed at 02/28/2023 0600 Gross per 24 hour  Intake 676 ml  Output 925 ml  Net -249 ml   Net IO Since Admission: -1,884 mL [02/28/23 1017]  Wt Readings from Last 3 Encounters:  02/28/23 85 kg  08/18/22 84.5 kg  06/17/22 91.2 kg     Unresulted Labs (From admission, onward)     Start     Ordered   02/27/23 0500  Basic metabolic panel  Daily,   R      02/26/23 0754   02/27/23 0500  CBC  Daily,   R      02/26/23 0754          Data Reviewed: I have personally reviewed following labs and imaging studies CBC: Recent Labs  Lab 02/25/23 1226 02/26/23 0108 02/27/23 0130 02/28/23 0048  WBC 4.1 4.0 3.8* 4.1  NEUTROABS 3.0  --   --   --   HGB 10.3* 8.8* 8.4* 8.2*  HCT 34.0* 28.1* 27.0* 26.7*  MCV 94.2 90.6 91.5 90.5  PLT 192 183 172 167  Basic Metabolic Panel: Recent Labs  Lab 02/25/23 1226 02/26/23 0108 02/27/23 0130 02/28/23 0048  NA 137 140 139 137  K 3.5 3.4* 3.6 3.9  CL 108 111 110 109  CO2 18* 19* 20* 18*   GLUCOSE 89 88 65* 82  BUN 79* 79* 79* 81*  CREATININE 3.91* 3.94* 3.92* 3.98*  CALCIUM 7.2* 7.1* 7.1* 6.9*  MG  --  2.8*  --   --    No results found for this or any previous visit (from the past 240 hour(s)).  Antimicrobials: Anti-infectives (From admission, onward)    None      Culture/Microbiology None  Radiology Studies: ECHOCARDIOGRAM COMPLETE  Result Date: 02/26/2023    ECHOCARDIOGRAM REPORT   Patient Name:   RHYANA BUSALACCHI Date of Exam: 02/26/2023 Medical Rec #:  [id number]      Height:       65.0 in Accession #:    [id number]     Weight:       185.2 lb Date of Birth:  1951/04/05     BSA:          1.914 m Patient Age:    71 years       BP:           174/79 mmHg Patient Gender: F              HR:           60 bpm. Exam Location:  Inpatient Procedure: 2D Echo, Cardiac Doppler, Color Doppler and Strain Analysis Indications:    CHF- Acute Diastolic  History:        Patient has prior history of Echocardiogram examinations, most                 recent 06/03/2022. CHF; Risk Factors:Hypertension, Dyslipidemia                 and Stage 3 Chronic Kidney Disease.  Sonographer:    Raeford Razor Referring Phys: [id number] CHRISTOPHER L SCHUMANN IMPRESSIONS  1. Left ventricular ejection fraction, by estimation, is 50 to 55%. The left ventricle has low normal function. The left ventricle has no regional wall motion abnormalities. There is severe concentric left ventricular hypertrophy. Left ventricular diastolic function could not be evaluated. The average left ventricular global longitudinal strain is -13.0 %. The global longitudinal strain is abnormal.  2. Right ventricular systolic function is normal. The right ventricular  size is normal. Mildly increased right ventricular wall thickness. There is normal pulmonary artery systolic pressure.  3. Left atrial size was moderately dilated.  4. Large pleural effusion in both left and right lateral regions.  5. The mitral valve is abnormal. Mild mitral valve  regurgitation. Moderate mitral annular calcification.  6. The aortic valve is tricuspid. There is mild calcification of the aortic valve. There is moderate thickening of the aortic valve. Aortic valve regurgitation is mild. Aortic valve sclerosis/calcification is present, without any evidence of aortic stenosis.  7. The inferior vena cava is normal in size with <50% respiratory variability, suggesting right atrial pressure of 8 mmHg.  8. Cannot exclude a small PFO. Comparison(s): No significant change from prior study. Prior images reviewed side by side. Conclusion(s)/Recommendation(s): Consider future nuclear scintigraphy study to rule out cardiac amyloisosis if clinically indicated. FINDINGS  Left Ventricle: No systolic anterior motion of the mitral valve, no LVOT gradient. Abnormal apical to basal strain ratio, 2.5/1. Left ventricular ejection fraction, by estimation, is 50 to 55%. The left ventricle has low normal function. The left ventricle has no regional wall motion abnormalities. The average left ventricular global longitudinal strain is -13.0 %. The global longitudinal strain is abnormal. The left ventricular internal cavity size was normal in size. There is severe concentric left ventricular hypertrophy. Left ventricular diastolic function could not be evaluated due to mitral annular calcification (moderate or greater). Left ventricular diastolic function could not be evaluated. Right Ventricle: The right ventricular size is normal. Mildly increased right ventricular wall thickness. Right ventricular systolic function is normal. There is normal pulmonary artery systolic pressure. The tricuspid regurgitant velocity is 2.08 m/s, and with an assumed right atrial pressure of 8 mmHg, the estimated right ventricular systolic pressure is 25.3 mmHg. Left Atrium: Left atrial size was moderately dilated. Right Atrium: Right atrial size was normal in size. Pericardium: Trivial pericardial effusion is present. The  pericardial effusion is posterior to the left ventricle. Mitral Valve: The mitral valve is abnormal. Moderate mitral annular calcification. Mild mitral valve regurgitation. Tricuspid Valve: The tricuspid valve is normal in structure. Tricuspid valve regurgitation is mild . No evidence of tricuspid stenosis. Aortic Valve: The aortic valve is tricuspid. There is mild calcification of the aortic valve. There is moderate thickening of the aortic valve. There is mild aortic valve annular calcification. Aortic valve regurgitation is mild. Aortic regurgitation PHT  measures 437 msec. Aortic valve sclerosis/calcification is present, without any evidence of aortic stenosis. Aortic valve mean gradient measures 4.0 mmHg. Aortic valve peak gradient measures 6.9 mmHg. Aortic valve area, by VTI measures 1.92 cm. Pulmonic Valve: The pulmonic valve was grossly normal. Pulmonic valve regurgitation is mild. No evidence of pulmonic stenosis. Aorta: The aortic root and ascending aorta are structurally normal, with no evidence of dilitation. Venous: The inferior vena cava is normal in size with less than 50% respiratory variability, suggesting right atrial pressure of 8 mmHg. IAS/Shunts: The interatrial septum appears to be lipomatous. Cannot exclude a small PFO. Additional Comments: There is a large pleural effusion in both left and right lateral regions.  LEFT VENTRICLE PLAX 2D LVIDd:         4.10 cm     Diastology LVIDs:         3.20 cm     LV e' medial:    3.92 cm/s LV PW:         2.10 cm     LV E/e' medial:  22.7 LV IVS:  1.20 cm     LV e' lateral:   5.44 cm/s LVOT diam:     1.90 cm     LV E/e' lateral: 16.3 LV SV:         79 LV SV Index:   41          2D Longitudinal Strain LVOT Area:     2.84 cm    2D Strain GLS Avg:     -13.0 %  LV Volumes (MOD) LV vol d, MOD A2C: 97.2 ml LV vol d, MOD A4C: 91.0 ml LV vol s, MOD A2C: 52.5 ml LV vol s, MOD A4C: 45.7 ml LV SV MOD A2C:     44.7 ml LV SV MOD A4C:     91.0 ml LV SV MOD BP:       43.1 ml RIGHT VENTRICLE             IVC RV Basal diam:  2.80 cm     IVC diam: 1.90 cm RV S prime:     10.20 cm/s TAPSE (M-mode): 2.6 cm LEFT ATRIUM             Index        RIGHT ATRIUM           Index LA diam:        2.55 cm 1.33 cm/m   RA Area:     12.20 cm LA Vol (A2C):   94.9 ml 49.57 ml/m  RA Volume:   31.40 ml  16.40 ml/m LA Vol (A4C):   53.5 ml 27.94 ml/m LA Biplane Vol: 77.6 ml 40.53 ml/m  AORTIC VALVE AV Area (Vmax):    2.34 cm AV Area (Vmean):   2.06 cm AV Area (VTI):     1.92 cm AV Vmax:           131.00 cm/s AV Vmean:          96.500 cm/s AV VTI:            0.413 m AV Peak Grad:      6.9 mmHg AV Mean Grad:      4.0 mmHg LVOT Vmax:         108.00 cm/s LVOT Vmean:        70.000 cm/s LVOT VTI:          0.280 m LVOT/AV VTI ratio: 0.68 AI PHT:            437 msec  AORTA Ao Root diam: 3.10 cm Ao Asc diam:  3.20 cm MITRAL VALVE                TRICUSPID VALVE MV Area (PHT): 3.60 cm     TR Peak grad:   17.3 mmHg MV Decel Time: 211 msec     TR Vmax:        208.00 cm/s MV E velocity: 88.80 cm/s MV A velocity: 109.00 cm/s  SHUNTS MV E/A ratio:  0.81         Systemic VTI:  0.28 m                             Systemic Diam: 1.90 cm Riley Lam MD Electronically signed by Riley Lam MD Signature Date/Time: 02/26/2023/4:31:56 PM    Final      LOS: 3 days   Lanae Boast, MD [facility]  02/28/2023, 10:17 AM

## 2023-02-28 NOTE — Progress Notes (Signed)
[facility] Progress Note   Assessment/ Plan:    CKD V: at baseline Cr, maybe a little better, in the setting of volume overload             - continue Lasix 80 IV TID--> anticipate that she will need 24-48 hrs more of that then can switch back to PO torsemide.  She is nonambulatory so not sure how much improvement in swelling there is to expect- she does report that SOB is much better so maybe that should be the metric             - BP is generous to allow for that             - nondialytic intervention  - nothing else to suggest- will sign off- call with questions   2.  Acute on chronic CHF exac:                       - cardiology following   3.  Anemia:             - Hgb 10.3, continue to follow  - gets retacrit at [facility]--> ordering aranesp today 5/17 x 1   4.  Dispo: pending  Subjective:     Sleeping, no complaints   Objective:   BP (!) 147/73 (BP Location: Left Arm)   Pulse 63   Temp (!) 97.5 F (36.4 C) (Oral)   Resp 16   Ht 5\' 5"  (1.651 m)   Wt 85 kg   SpO2 97%   BMI 31.18 kg/m   Intake/Output Summary (Last 24 hours) at 02/28/2023 1435 Last data filed at 02/28/2023 0600 Gross per 24 hour  Intake 556 ml  Output 675 ml  Net -119 ml   Weight change: 0 kg  Physical Exam: GEN  sitting in chair NAD HEENT sclerae anicteric NECK  no JVD PULM clear CV RR ABD soft EXT 3+ pitting edema bilaterally NEURO AAO x 3 nonfocal  Imaging: ECHOCARDIOGRAM COMPLETE  Result Date: 02/26/2023    ECHOCARDIOGRAM REPORT   Patient Name:   Kara Mullins Date of Exam: 02/26/2023 Medical Rec #:  [id number]      Height:       65.0 in Accession #:    [id number]     Weight:       185.2 lb Date of Birth:  [birth year]     BSA:          1.914 m Patient Age:    71 years       BP:           174/79 mmHg Patient Gender: F              HR:           60 bpm. Exam Location:  Inpatient Procedure: 2D Echo, Cardiac Doppler, Color Doppler and Strain Analysis Indications:    CHF- Acute  Diastolic  History:        Patient has prior history of Echocardiogram examinations, most                 recent 06/03/2022. CHF; Risk Factors:Hypertension, Dyslipidemia                 and Stage 3 Chronic Kidney Disease.  Sonographer:    Raeford Razor Referring Phys: [id number] CHRISTOPHER L SCHUMANN IMPRESSIONS  1. Left ventricular ejection fraction, by estimation, is 50 to 55%. The left ventricle has low normal function. The  left ventricle has no regional wall motion abnormalities. There is severe concentric left ventricular hypertrophy. Left ventricular diastolic function could not be evaluated. The average left ventricular global longitudinal strain is -13.0 %. The global longitudinal strain is abnormal.  2. Right ventricular systolic function is normal. The right ventricular size is normal. Mildly increased right ventricular wall thickness. There is normal pulmonary artery systolic pressure.  3. Left atrial size was moderately dilated.  4. Large pleural effusion in both left and right lateral regions.  5. The mitral valve is abnormal. Mild mitral valve regurgitation. Moderate mitral annular calcification.  6. The aortic valve is tricuspid. There is mild calcification of the aortic valve. There is moderate thickening of the aortic valve. Aortic valve regurgitation is mild. Aortic valve sclerosis/calcification is present, without any evidence of aortic stenosis.  7. The inferior vena cava is normal in size with <50% respiratory variability, suggesting right atrial pressure of 8 mmHg.  8. Cannot exclude a small PFO. Comparison(s): No significant change from prior study. Prior images reviewed side by side. Conclusion(s)/Recommendation(s): Consider future nuclear scintigraphy study to rule out cardiac amyloisosis if clinically indicated. FINDINGS  Left Ventricle: No systolic anterior motion of the mitral valve, no LVOT gradient. Abnormal apical to basal strain ratio, 2.5/1. Left ventricular ejection fraction, by  estimation, is 50 to 55%. The left ventricle has low normal function. The left ventricle has no regional wall motion abnormalities. The average left ventricular global longitudinal strain is -13.0 %. The global longitudinal strain is abnormal. The left ventricular internal cavity size was normal in size. There is severe concentric left ventricular hypertrophy. Left ventricular diastolic function could not be evaluated due to mitral annular calcification (moderate or greater). Left ventricular diastolic function could not be evaluated. Right Ventricle: The right ventricular size is normal. Mildly increased right ventricular wall thickness. Right ventricular systolic function is normal. There is normal pulmonary artery systolic pressure. The tricuspid regurgitant velocity is 2.08 m/s, and with an assumed right atrial pressure of 8 mmHg, the estimated right ventricular systolic pressure is 25.3 mmHg. Left Atrium: Left atrial size was moderately dilated. Right Atrium: Right atrial size was normal in size. Pericardium: Trivial pericardial effusion is present. The pericardial effusion is posterior to the left ventricle. Mitral Valve: The mitral valve is abnormal. Moderate mitral annular calcification. Mild mitral valve regurgitation. Tricuspid Valve: The tricuspid valve is normal in structure. Tricuspid valve regurgitation is mild . No evidence of tricuspid stenosis. Aortic Valve: The aortic valve is tricuspid. There is mild calcification of the aortic valve. There is moderate thickening of the aortic valve. There is mild aortic valve annular calcification. Aortic valve regurgitation is mild. Aortic regurgitation PHT  measures 437 msec. Aortic valve sclerosis/calcification is present, without any evidence of aortic stenosis. Aortic valve mean gradient measures 4.0 mmHg. Aortic valve peak gradient measures 6.9 mmHg. Aortic valve area, by VTI measures 1.92 cm. Pulmonic Valve: The pulmonic valve was grossly normal. Pulmonic  valve regurgitation is mild. No evidence of pulmonic stenosis. Aorta: The aortic root and ascending aorta are structurally normal, with no evidence of dilitation. Venous: The inferior vena cava is normal in size with less than 50% respiratory variability, suggesting right atrial pressure of 8 mmHg. IAS/Shunts: The interatrial septum appears to be lipomatous. Cannot exclude a small PFO. Additional Comments: There is a large pleural effusion in both left and right lateral regions.  LEFT VENTRICLE PLAX 2D LVIDd:         4.10 cm  Diastology LVIDs:         3.20 cm     LV e' medial:    3.92 cm/s LV PW:         2.10 cm     LV E/e' medial:  22.7 LV IVS:        1.20 cm     LV e' lateral:   5.44 cm/s LVOT diam:     1.90 cm     LV E/e' lateral: 16.3 LV SV:         79 LV SV Index:   41          2D Longitudinal Strain LVOT Area:     2.84 cm    2D Strain GLS Avg:     -13.0 %  LV Volumes (MOD) LV vol d, MOD A2C: 97.2 ml LV vol d, MOD A4C: 91.0 ml LV vol s, MOD A2C: 52.5 ml LV vol s, MOD A4C: 45.7 ml LV SV MOD A2C:     44.7 ml LV SV MOD A4C:     91.0 ml LV SV MOD BP:      43.1 ml RIGHT VENTRICLE             IVC RV Basal diam:  2.80 cm     IVC diam: 1.90 cm RV S prime:     10.20 cm/s TAPSE (M-mode): 2.6 cm LEFT ATRIUM             Index        RIGHT ATRIUM           Index LA diam:        2.55 cm 1.33 cm/m   RA Area:     12.20 cm LA Vol (A2C):   94.9 ml 49.57 ml/m  RA Volume:   31.40 ml  16.40 ml/m LA Vol (A4C):   53.5 ml 27.94 ml/m LA Biplane Vol: 77.6 ml 40.53 ml/m  AORTIC VALVE AV Area (Vmax):    2.34 cm AV Area (Vmean):   2.06 cm AV Area (VTI):     1.92 cm AV Vmax:           131.00 cm/s AV Vmean:          96.500 cm/s AV VTI:            0.413 m AV Peak Grad:      6.9 mmHg AV Mean Grad:      4.0 mmHg LVOT Vmax:         108.00 cm/s LVOT Vmean:        70.000 cm/s LVOT VTI:          0.280 m LVOT/AV VTI ratio: 0.68 AI PHT:            437 msec  AORTA Ao Root diam: 3.10 cm Ao Asc diam:  3.20 cm MITRAL VALVE                 TRICUSPID VALVE MV Area (PHT): 3.60 cm     TR Peak grad:   17.3 mmHg MV Decel Time: 211 msec     TR Vmax:        208.00 cm/s MV E velocity: 88.80 cm/s MV A velocity: 109.00 cm/s  SHUNTS MV E/A ratio:  0.81         Systemic VTI:  0.28 m  Systemic Diam: 1.90 cm Riley Lam MD Electronically signed by Riley Lam MD Signature Date/Time: 02/26/2023/4:31:56 PM    Final     Labs: BMET Recent Labs  Lab 02/25/23 1226 02/26/23 0108 02/27/23 0130 02/28/23 0048  NA 137 140 139 137  K 3.5 3.4* 3.6 3.9  CL 108 111 110 109  CO2 18* 19* 20* 18*  GLUCOSE 89 88 65* 82  BUN 79* 79* 79* 81*  CREATININE 3.91* 3.94* 3.92* 3.98*  CALCIUM 7.2* 7.1* 7.1* 6.9*   CBC Recent Labs  Lab 02/25/23 1226 02/26/23 0108 02/27/23 0130 02/28/23 0048  WBC 4.1 4.0 3.8* 4.1  NEUTROABS 3.0  --   --   --   HGB 10.3* 8.8* 8.4* 8.2*  HCT 34.0* 28.1* 27.0* 26.7*  MCV 94.2 90.6 91.5 90.5  PLT 192 183 172 167    Medications:     carvedilol  3.125 mg Oral BID WC   Chlorhexidine Gluconate Cloth  6 each Topical Daily   Chlorhexidine Gluconate Cloth  6 each Topical Q0600   darbepoetin (ARANESP) injection - NON-DIALYSIS  150 mcg Subcutaneous Once   folic acid  1 mg Oral Daily   furosemide  80 mg Intravenous TID   heparin  5,000 Units Subcutaneous Q8H   hydrALAZINE  50 mg Oral Q8H   iron polysaccharides  150 mg Oral Daily   sevelamer carbonate  1,600 mg Oral TID WC   sodium bicarbonate  650 mg Oral BID   sodium chloride flush  3 mL Intravenous Q12H    Bufford Buttner MD 02/28/2023, 2:35 PM

## 2023-02-28 NOTE — Progress Notes (Signed)
Upper extremity venous left study completed.   Please see CV Proc for preliminary results.   Giovanni Biby, RDMS, RVT   

## 2023-03-01 DIAGNOSIS — I5033 Acute on chronic diastolic (congestive) heart failure: Secondary | ICD-10-CM | POA: Diagnosis not present

## 2023-03-01 DIAGNOSIS — N184 Chronic kidney disease, stage 4 (severe): Secondary | ICD-10-CM | POA: Diagnosis not present

## 2023-03-01 DIAGNOSIS — I10 Essential (primary) hypertension: Secondary | ICD-10-CM | POA: Diagnosis not present

## 2023-03-01 LAB — BASIC METABOLIC PANEL
Anion gap: 9 (ref 5–15)
BUN: 83 mg/dL — ABNORMAL HIGH (ref 8–23)
CO2: 20 mmol/L — ABNORMAL LOW (ref 22–32)
Calcium: 7.1 mg/dL — ABNORMAL LOW (ref 8.9–10.3)
Chloride: 108 mmol/L (ref 98–111)
Creatinine, Ser: 4.05 mg/dL — ABNORMAL HIGH (ref 0.44–1.00)
GFR, Estimated: 11 mL/min — ABNORMAL LOW (ref >60)
Glucose, Bld: 87 mg/dL (ref 70–99)
Potassium: 4.1 mmol/L (ref 3.5–5.1)
Sodium: 137 mmol/L (ref 135–145)

## 2023-03-01 LAB — CBC
HCT: 26.7 % — ABNORMAL LOW (ref 36.0–46.0)
Hemoglobin: 8.3 g/dL — ABNORMAL LOW (ref 12.0–15.0)
MCH: 28.1 pg (ref 26.0–34.0)
MCHC: 31.1 g/dL (ref 30.0–36.0)
MCV: 90.5 fL (ref 80.0–100.0)
Platelets: 168 10*3/uL (ref 150–400)
RBC: 2.95 MIL/uL — ABNORMAL LOW (ref 3.87–5.11)
RDW: 17.4 % — ABNORMAL HIGH (ref 11.5–15.5)
WBC: 3.8 10*3/uL — ABNORMAL LOW (ref 4.0–10.5)
nRBC: 0 % (ref 0.0–0.2)

## 2023-03-01 NOTE — Progress Notes (Signed)
Patient refusing second tablet of renvela. States she only takes one.

## 2023-03-01 NOTE — Plan of Care (Signed)
  Problem: Education: Goal: Ability to demonstrate management of disease process will improve Outcome: Progressing Goal: Ability to verbalize understanding of medication therapies will improve Outcome: Progressing Goal: Individualized Educational Video(s) Outcome: Progressing   Problem: Activity: Goal: Capacity to carry out activities will improve Outcome: Progressing   Problem: Cardiac: Goal: Ability to achieve and maintain adequate cardiopulmonary perfusion will improve Outcome: Progressing   Problem: Education: Goal: Knowledge of General Education information will improve Description: Including pain rating scale, medication(s)/side effects and non-pharmacologic comfort measures Outcome: Progressing   Problem: Health Behavior/Discharge Planning: Goal: Ability to manage health-related needs will improve Outcome: Progressing   Problem: Clinical Measurements: Goal: Ability to maintain clinical measurements within normal limits will improve Outcome: Progressing Goal: Will remain free from infection Outcome: Progressing Goal: Diagnostic test results will improve Outcome: Progressing Goal: Respiratory complications will improve Outcome: Progressing Goal: Cardiovascular complication will be avoided Outcome: Progressing   Problem: Activity: Goal: Risk for activity intolerance will decrease Outcome: Progressing   Problem: Coping: Goal: Level of anxiety will decrease Outcome: Progressing   Problem: Elimination: Goal: Will not experience complications related to bowel motility Outcome: Progressing Goal: Will not experience complications related to urinary retention Outcome: Progressing   Problem: Safety: Goal: Ability to remain free from injury will improve Outcome: Progressing   Problem: Skin Integrity: Goal: Risk for impaired skin integrity will decrease Outcome: Progressing   

## 2023-03-01 NOTE — Progress Notes (Signed)
Rounding Note    Patient Name: Kara Mullins Date of Encounter: 03/01/2023  [facility] Cardiologist: None   Subjective   Net -1.4 L yesterday, -3.3 L on admission.  Creatinine stable at 4.05.  BP 142/65.  Reports dyspnea improved but not back to baseline  Inpatient Medications    Scheduled Meds:  carvedilol  3.125 mg Oral BID WC   Chlorhexidine Gluconate Cloth  6 each Topical Daily   Chlorhexidine Gluconate Cloth  6 each Topical Q0600   folic acid  1 mg Oral Daily   furosemide  80 mg Intravenous TID   heparin  5,000 Units Subcutaneous Q8H   hydrALAZINE  50 mg Oral Q8H   iron polysaccharides  150 mg Oral Daily   sevelamer carbonate  1,600 mg Oral TID WC   sodium bicarbonate  650 mg Oral BID   sodium chloride flush  3 mL Intravenous Q12H   Continuous Infusions:  PRN Meds: acetaminophen **OR** acetaminophen, albuterol, calcium carbonate (dosed in mg elemental calcium), camphor-menthol **AND** hydrOXYzine, camphor-menthol, docusate sodium, feeding supplement (NEPRO CARB STEADY), hydrALAZINE, hydrocortisone cream, ondansetron **OR** ondansetron (ZOFRAN) IV, sorbitol   Vital Signs    Vitals:   02/28/23 2017 03/01/23 0033 03/01/23 0524 03/01/23 0752  BP: (!) 158/80 128/68 (!) 168/68 (!) 142/65  Pulse: 60 62 62 66  Resp: 18 16 17 18   Temp: (!) 94.4 F (34.7 C) (!) 97.5 F (36.4 C) (!) 97.5 F (36.4 C) (!) 97.5 F (36.4 C)  TempSrc: Rectal Oral Oral Oral  SpO2: 97% 97% 97% 97%  Weight:   85 kg   Height:        Intake/Output Summary (Last 24 hours) at 03/01/2023 0929 Last data filed at 03/01/2023 0530 Gross per 24 hour  Intake --  Output 1425 ml  Net -1425 ml       03/01/2023    5:24 AM 02/28/2023    5:39 AM 02/27/2023    5:18 AM  Last 3 Weights  Weight (lbs) 187 lb 6.3 oz 187 lb 6.3 oz 187 lb 6.3 oz  Weight (kg) 85 kg 85 kg 85 kg      Telemetry    NSR - Personally Reviewed  ECG    No new ECG - Personally Reviewed  Physical Exam   GEN: No  acute distress.   Neck: + JVD Cardiac: RRR, no murmurs, rubs, or gallops.  Respiratory: Clear to auscultation bilaterally. GI: Soft, nontender, non-distended  MS: 2+ BLE edema Neuro:  Nonfocal  Psych: Normal affect   Labs    High Sensitivity Troponin:   Recent Labs  Lab 02/25/23 1226 02/25/23 1403  TROPONINIHS 23* 38*      Chemistry Recent Labs  Lab 02/26/23 0108 02/27/23 0130 02/28/23 0048 03/01/23 0042  NA 140 139 137 137  K 3.4* 3.6 3.9 4.1  CL 111 110 109 108  CO2 19* 20* 18* 20*  GLUCOSE 88 65* 82 87  BUN 79* 79* 81* 83*  CREATININE 3.94* 3.92* 3.98* 4.05*  CALCIUM 7.1* 7.1* 6.9* 7.1*  MG 2.8*  --   --   --   GFRNONAA 12* 12* 12* 11*  ANIONGAP 10 9 10 9      Lipids No results for input(s): "CHOL", "TRIG", "HDL", "LABVLDL", "LDLCALC", "CHOLHDL" in the last 168 hours.  Hematology Recent Labs  Lab 02/27/23 0130 02/28/23 0048 03/01/23 0042  WBC 3.8* 4.1 3.8*  RBC 2.95* 2.95* 2.95*  HGB 8.4* 8.2* 8.3*  HCT 27.0* 26.7* 26.7*  MCV  91.5 90.5 90.5  MCH 28.5 27.8 28.1  MCHC 31.1 30.7 31.1  RDW 17.5* 17.8* 17.4*  PLT 172 167 168    Thyroid No results for input(s): "TSH", "FREET4" in the last 168 hours.  BNP Recent Labs  Lab 02/25/23 1226  BNP 1,037.5*     DDimer No results for input(s): "DDIMER" in the last 168 hours.   Radiology    VAS [place] UPPER EXTREMITY VENOUS DUPLEX  Result Date: 03/01/2023 UPPER VENOUS STUDY  Patient Name:  EGAN ZAMOR  Date of Exam:   02/28/2023 Medical Rec #: [id number]       Accession #:    [id number] Date of Birth: 1951-03-14      Patient Gender: F Patient Age:   72 years Exam Location:  [facility] Procedure:      VAS [place] UPPER EXTREMITY VENOUS DUPLEX Referring Phys: RAMESH KC --------------------------------------------------------------------------------  Indications: Asymmetrical left upper extremity swelling Risk Factors: CHF. Comparison Study: No prior studies. Performing Technologist: Jean Rosenthal RDMS, RVT   Examination Guidelines: A complete evaluation includes B-mode imaging, spectral Doppler, color Doppler, and power Doppler as needed of all accessible portions of each vessel. Bilateral testing is considered an integral part of a complete examination. Limited examinations for reoccurring indications may be performed as noted.  Right Findings: +----------+------------+---------+-----------+----------+-------+ RIGHT     CompressiblePhasicitySpontaneousPropertiesSummary +----------+------------+---------+-----------+----------+-------+ Subclavian               Yes       Yes                      +----------+------------+---------+-----------+----------+-------+  Left Findings: +----------+------------+---------+-----------+----------+-------+ LEFT      CompressiblePhasicitySpontaneousPropertiesSummary +----------+------------+---------+-----------+----------+-------+ IJV           Full       Yes       Yes                      +----------+------------+---------+-----------+----------+-------+ Subclavian               Yes       Yes                      +----------+------------+---------+-----------+----------+-------+ Axillary      Full       Yes       Yes                      +----------+------------+---------+-----------+----------+-------+ Brachial      Full                                          +----------+------------+---------+-----------+----------+-------+ Radial        Full                                          +----------+------------+---------+-----------+----------+-------+ Ulnar         Full                                          +----------+------------+---------+-----------+----------+-------+ Cephalic      Full                                          +----------+------------+---------+-----------+----------+-------+  Basilic       Full                                           +----------+------------+---------+-----------+----------+-------+  Summary:  Right: No evidence of thrombosis in the subclavian.  Left: No evidence of deep vein thrombosis in the upper extremity. No evidence of superficial vein thrombosis in the upper extremity. Significant subcutaneous edema noted.  *See table(s) above for measurements and observations.  Diagnosing physician: Gerarda Fraction Electronically signed by Gerarda Fraction on 03/01/2023 at 8:49:32 AM.    Final     Cardiac Studies    Patient Profile     72 y.o. female with a hx of chronic combined systolic and diastolic heart failure, CKD stage IV, hypertension, lymphedema who is being seen 02/25/2023 for the evaluation of heart failure   Assessment & Plan    Acute on chronic diastolic heart failure: Presented with shortness of breath and worsening lower extremity edema.  Appears volume overloaded on exam.  Echo shows EF 50 to 55%, severe LVH, normal RV function, mild MR, mild AI -Reports improvement in symptoms with diuresis.  Plan per nephrology yesterday was to continue Lasix 80 mg IV 3 times daily for additional 24 to 48 hours.  Agree with plan, would continue IV Lasix today   CKD stage IV-V: Creatinine 4.05, has varied from 3.0-6.5 over the last 6 months.  Will need to closely monitor renal function with diuresis.  Nephrology consulted.   Hypertension: On hydralazine 50 mg 3 times daily and coreg 3.125 mg BID.  Appears controlled  For questions or updates, please contact [facility] Please consult [email or web address] for contact info under        Signed, [name], MD  03/01/2023, 9:29 AM

## 2023-03-01 NOTE — Progress Notes (Signed)
PROGRESS NOTE Kara Mullins  ZOX:[id number] DOB: Apr 19, 1951 DOA: 02/25/2023 PCP: Caesar Bookman, NP  Brief Narrative/Hospital Course: 2 yof w/ chronic diastolic CHF w/ lvef 55%,GAD, HTN, HLD,Obseity, lymphedema and stage IV b/l bun/creat 60/3.0 (01/22/23) previously in 90s/5 presented with shortness of breath, worsening edema. She went in for Retacrit injection on 3/14 and was given blood transfusion.  Her fluid balance has seemed off since.  She had been on torsemide but the dose was decreased because her renal function worsened.  She restarted the torsemide at 40 mg BID (instead of 80) but  her edema has worsened and her daughter noticed pitting edema in her flanks In the ED hypertensive 180-190, labs showed BNP 1037 troponin 23>, hemoglobin 10.3 g, BUN 79 creatinine 3.9. Chest x-ray bilateral pleural effusion left greater than right right effusion appears slightly larger compared to x-ray 08/18/2022. Patient was given IV Lasix and admitted for acute on chronic CHF-cardiology and nephrology consulted.  Patient manage is a high dose IV Lasix.  Nephrology signed off> difficult to get swelling improved since she is non ambulatory and advised to assess volume status with orthopnea and shortness of breath which is improving.  Cardiology following closely     Subjective:  Seen and examined Overnight no fever BP stable  Weight remains about the same on bed. Patient reports mild shortness of breath leg edema overall better   Assessment and Plan: Principal Problem:   Acute on chronic diastolic CHF (congestive heart failure) (HCC) Active Problems:   CKD (chronic kidney disease) stage 5, GFR less than 15 ml/min (HCC)   Essential hypertension   Hyperlipidemia   Chronic kidney disease (CKD), stage IV (severe) (HCC)   Acute on chronic diastolic heart failure Extensive extremity swelling- neg for DVT in UE ( left): Lvef 50-55% w/ severe LVH, no WMA presenting w/ worsening of shortness of breath  and extensive lower leg edema. Cardiology nephrology following, appreciate inputs. Continue with IV lasix 80 mg tid per nephro/cardio> continue for 1 more day. Nephrology signed off> difficult to get swelling improved since she is non ambulatory and advised to assess volume status with orthopnea and shortness of breath which is improving.  Cardiology following closely> defer to cardio when to transition to oral torsemide hopefully soon Weight remains same and some improvement in the lower extremity swelling Cont to monitor daily I/O, daly weight, electrolytes and net balance as below. Keep on 2 gm salt and fluid restricted diet. Net IO Since Admission: -3,659 mL [03/01/23 1146]  Filed Weights   02/27/23 0518 02/28/23 0539 03/01/23 0524  Weight: 85 kg 85 kg 85 kg    Recent Labs  Lab 02/25/23 1226 02/26/23 0108 02/27/23 0130 02/28/23 0048 03/01/23 0042  BNP 1,037.5*  --   --   --   --   BUN 79* 79* 79* 81* 83*  CREATININE 3.91* 3.94* 3.92* 3.98* 4.05*  K 3.5 3.4* 3.6 3.9 4.1  MG  --  2.8*  --   --   --     Hypokalemia-resolved.  CKD stage IV-V: Renal function overall remains stable at 3.98-4.05.  Monitor while on diuretics follow-up with nephrology as outpatient  Metabolic acidosis in the setting of CKD monitor bicarb Recent Labs    08/18/22 0046 08/23/22 0000 10/04/22 1249 10/29/22 0000 01/22/23 0000 02/25/23 1226 02/26/23 0108 02/27/23 0130 02/28/23 0048 03/01/23 0042  BUN 106* 106* 126* 92* 60* 79* 79* 79* 81* 83*  CREATININE 5.12* 5.5* 6.53* 4.3* 3.0* 3.91* 3.94* 3.92* 3.98* 4.05*  CO2 17* 16 19* 15 18 18* 19* 20* 18* 20*    Metabolic bone disease: continue Renvela  Anemia of chronic renal disease hb close to baseline or slightly lower> 8 to 10 mg.Previously as low as 6.3 g with 12/26/2022 needing blood transfusion.  Continue folic acid, iron supplement.   S/ paranesp 57.  Continue Procrit outpatient  Recent Labs  Lab 02/25/23 1226 02/26/23 0108 02/27/23 0130  02/28/23 0048 03/01/23 0042  HGB 10.3* 8.8* 8.4* 8.2* 8.3*  HCT 34.0* 28.1* 27.0* 26.7* 26.7*    Hypertension: Uncontrolled in the ED. currently stable cont Hydralazine 50 tid and Coreg low-dose.    Class I obesity with BMI 30.8 will benefit weight loss, sleep apnea evaluation  Sacral Pressure ulcer poa see below: Pressure Injury 03/28/22 Sacrum Mid Stage 1 -  Intact skin with non-blanchable redness of a localized area usually over a bony prominence. Area is darker and non-blanchable. Intact, clean, dry. (Active)  03/28/22 0126  Location: Sacrum  Location Orientation: Mid  Staging: Stage 1 -  Intact skin with non-blanchable redness of a localized area usually over a bony prominence.  Wound Description (Comments): Area is darker and non-blanchable. Intact, clean, dry.  Present on Admission: Yes  Dressing Type Foam - Lift dressing to assess site every shift 02/25/23 2005   Class I Obesity:Patient's Body mass index is 31.18 kg/m. : Will benefit with PCP follow-up, weight loss  healthy lifestyle and outpatient sleep evaluation.  Goals of care: Palliative care has been consulted currently full code-family has been educated to consider DNR/DNI understanding evidenced-based poor outcomes in similar hospitalized ptient, as a cause of arrest is likely associated with advanced chronic illness rather than easily reversible acute cardiopulmonary.  Has complex medical comorbidities.   DVT prophylaxis: heparin injection 5,000 Units Start: 02/25/23 1615 Code Status:   Code Status: Full Code Family Communication: plan of care discussed with patient at bedside. Patient status is: Inpatient because of CHF Level of care: Telemetry Cardiac   Dispo: The patient is from: HOME lives with daughter            Anticipated disposition: home 1-2 days  Objective: Vitals last 24 hrs: Vitals:   03/01/23 0033 03/01/23 0524 03/01/23 0752 03/01/23 1114  BP: 128/68 (!) 168/68 (!) 142/65 (!) 165/73  Pulse: 62 62  66 61  Resp: 16 17 18 18   Temp: (!) 97.5 F (36.4 C) (!) 97.5 F (36.4 C) (!) 97.5 F (36.4 C) 97.6 F (36.4 C)  TempSrc: Oral Oral Oral Oral  SpO2: 97% 97% 97% 98%  Weight:  85 kg    Height:       Weight change: 0 kg  Physical Examination: General exam: AAox3, weak,older appearing HEENT:Oral mucosa moist, Ear/Nose WNL grossly, dentition normal. Respiratory system: bilaterally clear BS, no use of accessory muscle Cardiovascular system: S1 & S2 +, regular rate. Gastrointestinal system: Abdomen soft, NT,ND,BS+ Nervous System:Alert, awake, moving extremities and grossly nonfocal Extremities: LE ankle edema ++, lower extremities warm Skin: No rashes,no icterus. MSK: Normal muscle bulk,tone, power.   Medications reviewed:  Scheduled Meds:  carvedilol  3.125 mg Oral BID WC   Chlorhexidine Gluconate Cloth  6 each Topical Daily   Chlorhexidine Gluconate Cloth  6 each Topical Q0600   folic acid  1 mg Oral Daily   furosemide  80 mg Intravenous TID   heparin  5,000 Units Subcutaneous Q8H   hydrALAZINE  50 mg Oral Q8H   iron polysaccharides  150 mg Oral Daily  sevelamer carbonate  1,600 mg Oral TID WC   sodium bicarbonate  650 mg Oral BID   sodium chloride flush  3 mL Intravenous Q12H   Continuous Infusions: Diet Order             Diet renal with fluid restriction Fluid restriction: 1200 mL Fluid; Room service appropriate? Yes; Fluid consistency: Thin  Diet effective now                   Intake/Output Summary (Last 24 hours) at 03/01/2023 1146 Last data filed at 03/01/2023 1119 Gross per 24 hour  Intake --  Output 1775 ml  Net -1775 ml   Net IO Since Admission: -3,659 mL [03/01/23 1146]  Wt Readings from Last 3 Encounters:  03/01/23 85 kg  08/18/22 84.5 kg  06/17/22 91.2 kg     Unresulted Labs (From admission, onward)     Start     Ordered   02/27/23 0500  Basic metabolic panel  Daily,   R      02/26/23 0754   02/27/23 0500  CBC  Daily,   R      02/26/23  0754          Data Reviewed: I have personally reviewed following labs and imaging studies CBC: Recent Labs  Lab 02/25/23 1226 02/26/23 0108 02/27/23 0130 02/28/23 0048 03/01/23 0042  WBC 4.1 4.0 3.8* 4.1 3.8*  NEUTROABS 3.0  --   --   --   --   HGB 10.3* 8.8* 8.4* 8.2* 8.3*  HCT 34.0* 28.1* 27.0* 26.7* 26.7*  MCV 94.2 90.6 91.5 90.5 90.5  PLT 192 183 172 167 168  Basic Metabolic Panel: Recent Labs  Lab 02/25/23 1226 02/26/23 0108 02/27/23 0130 02/28/23 0048 03/01/23 0042  NA 137 140 139 137 137  K 3.5 3.4* 3.6 3.9 4.1  CL 108 111 110 109 108  CO2 18* 19* 20* 18* 20*  GLUCOSE 89 88 65* 82 87  BUN 79* 79* 79* 81* 83*  CREATININE 3.91* 3.94* 3.92* 3.98* 4.05*  CALCIUM 7.2* 7.1* 7.1* 6.9* 7.1*  MG  --  2.8*  --   --   --    No results found for this or any previous visit (from the past 240 hour(s)).  Antimicrobials: Anti-infectives (From admission, onward)    None      Culture/Microbiology None  Radiology Studies: VAS [place] UPPER EXTREMITY VENOUS DUPLEX  Result Date: 03/01/2023 UPPER VENOUS STUDY  Patient Name:  Kara Mullins  Date of Exam:   02/28/2023 Medical Rec #: [id number]       Accession #:    [id number] Date of Birth: Oct 10, 1951      Patient Gender: F Patient Age:   31 years Exam Location:  [facility] Procedure:      VAS [place] UPPER EXTREMITY VENOUS DUPLEX Referring Phys: Shanee Batch --------------------------------------------------------------------------------  Indications: Asymmetrical left upper extremity swelling Risk Factors: CHF. Comparison Study: No prior studies. Performing Technologist: Jean Rosenthal RDMS, RVT  Examination Guidelines: A complete evaluation includes B-mode imaging, spectral Doppler, color Doppler, and power Doppler as needed of all accessible portions of each vessel. Bilateral testing is considered an integral part of a complete examination. Limited examinations for reoccurring indications may be performed as noted.  Right Findings:  +----------+------------+---------+-----------+----------+-------+ RIGHT     CompressiblePhasicitySpontaneousPropertiesSummary +----------+------------+---------+-----------+----------+-------+ Subclavian               Yes       Yes                      +----------+------------+---------+-----------+----------+-------+  Left Findings: +----------+------------+---------+-----------+----------+-------+ LEFT      CompressiblePhasicitySpontaneousPropertiesSummary +----------+------------+---------+-----------+----------+-------+ IJV           Full       Yes       Yes                      +----------+------------+---------+-----------+----------+-------+ Subclavian               Yes       Yes                      +----------+------------+---------+-----------+----------+-------+ Axillary      Full       Yes       Yes                      +----------+------------+---------+-----------+----------+-------+ Brachial      Full                                          +----------+------------+---------+-----------+----------+-------+ Radial        Full                                          +----------+------------+---------+-----------+----------+-------+ Ulnar         Full                                          +----------+------------+---------+-----------+----------+-------+ Cephalic      Full                                          +----------+------------+---------+-----------+----------+-------+ Basilic       Full                                          +----------+------------+---------+-----------+----------+-------+  Summary:  Right: No evidence of thrombosis in the subclavian.  Left: No evidence of deep vein thrombosis in the upper extremity. No evidence of superficial vein thrombosis in the upper extremity. Significant subcutaneous edema noted.  *See table(s) above for measurements and observations.  Diagnosing physician: Gerarda Fraction Electronically signed by Gerarda Fraction on 03/01/2023 at 8:49:32 AM.    Final      LOS: 4 days   Lanae Boast, MD [facility]  03/01/2023, 11:46 AM

## 2023-03-02 DIAGNOSIS — I5033 Acute on chronic diastolic (congestive) heart failure: Secondary | ICD-10-CM | POA: Diagnosis not present

## 2023-03-02 DIAGNOSIS — N184 Chronic kidney disease, stage 4 (severe): Secondary | ICD-10-CM | POA: Diagnosis not present

## 2023-03-02 DIAGNOSIS — I10 Essential (primary) hypertension: Secondary | ICD-10-CM | POA: Diagnosis not present

## 2023-03-02 LAB — CBC
HCT: 28.9 % — ABNORMAL LOW (ref 36.0–46.0)
Hemoglobin: 8.8 g/dL — ABNORMAL LOW (ref 12.0–15.0)
MCH: 28.1 pg (ref 26.0–34.0)
MCHC: 30.4 g/dL (ref 30.0–36.0)
MCV: 92.3 fL (ref 80.0–100.0)
Platelets: 163 10*3/uL (ref 150–400)
RBC: 3.13 MIL/uL — ABNORMAL LOW (ref 3.87–5.11)
RDW: 17.5 % — ABNORMAL HIGH (ref 11.5–15.5)
WBC: 3.4 10*3/uL — ABNORMAL LOW (ref 4.0–10.5)
nRBC: 0 % (ref 0.0–0.2)

## 2023-03-02 LAB — BASIC METABOLIC PANEL
Anion gap: 9 (ref 5–15)
BUN: 80 mg/dL — ABNORMAL HIGH (ref 8–23)
CO2: 21 mmol/L — ABNORMAL LOW (ref 22–32)
Calcium: 7.3 mg/dL — ABNORMAL LOW (ref 8.9–10.3)
Chloride: 107 mmol/L (ref 98–111)
Creatinine, Ser: 4.06 mg/dL — ABNORMAL HIGH (ref 0.44–1.00)
GFR, Estimated: 11 mL/min — ABNORMAL LOW (ref >60)
Glucose, Bld: 104 mg/dL — ABNORMAL HIGH (ref 70–99)
Potassium: 3.8 mmol/L (ref 3.5–5.1)
Sodium: 137 mmol/L (ref 135–145)

## 2023-03-02 LAB — MAGNESIUM: Magnesium: 2.5 mg/dL — ABNORMAL HIGH (ref 1.7–2.4)

## 2023-03-02 MED ORDER — TORSEMIDE 20 MG PO TABS
40.0000 mg | ORAL_TABLET | Freq: Two times a day (BID) | ORAL | Status: DC
  Administered 2023-03-02 – 2023-03-03 (×3): 40 mg via ORAL
  Filled 2023-03-02 (×3): qty 2

## 2023-03-02 NOTE — Progress Notes (Signed)
PROGRESS NOTE Kara Mullins  ZOX:[id number] DOB: June 21, 1951 DOA: 02/25/2023 PCP: Caesar Bookman, NP  Brief Narrative/Hospital Course: 72 yof w/ chronic diastolic CHF w/ lvef 55%,GAD, HTN, HLD,Obseity, lymphedema and stage IV b/l bun/creat 60/3.0 (01/22/23) previously in 90s/5 presented with shortness of breath, worsening edema. She went in for Retacrit injection on 3/14 and was given blood transfusion.  Her fluid balance has seemed off since.  She had been on torsemide but the dose was decreased because her renal function worsened.  She restarted the torsemide at 40 mg BID (instead of 80) but  her edema has worsened and her daughter noticed pitting edema in her flanks In the ED hypertensive 180-190, labs showed BNP 1037 troponin 23>, hemoglobin 10.3 g, BUN 79 creatinine 3.9. Chest x-ray bilateral pleural effusion left greater than right right effusion appears slightly larger compared to x-ray 08/18/2022. Patient was given IV Lasix and admitted for acute on chronic CHF-cardiology and nephrology consulted.  Patient manage is a high dose IV Lasix.  Nephrology signed off> difficult to get swelling improved since she is non ambulatory and advised to assess volume status with orthopnea and shortness of breath which is improving.  Cardiology following closely     Subjective: Seen and examined. No new complaints daughter at the bedside Leg appears swollen but somewhat better from presentation, no orthopnea   Assessment and Plan: Principal Problem:   Acute on chronic diastolic CHF (congestive heart failure) (HCC) Active Problems:   CKD (chronic kidney disease) stage 5, GFR less than 15 ml/min (HCC)   Essential hypertension   Hyperlipidemia   Chronic kidney disease (CKD), stage IV (severe) (HCC)   Acute on chronic diastolic heart failure Extensive extremity swelling- neg for DVT in UE ( left): Lvef 50-55% w/ severe LVH, no WMA presenting w/ worsening of shortness of breath and extensive lower  leg edema. Cardiology nephrology following, appreciate inputs. Continue with IV lasix 80 mg tid per nephro/cardio> continue for 1 more day. Nephrology signed off> difficult to get swelling improved since she is non ambulatory and advised to assess volume status with orthopnea and shortness of breath which is improving.  Cardiology following closely> changed to oral torsemide today monitor 24 hours Cont to monitor daily I/O, daly weight, electrolytes and net balance as below. Keep on 2 gm salt and fluid restricted diet. Net IO Since Admission: -4,749 mL [03/02/23 1023]  Filed Weights   03/01/23 0524 03/02/23 0500 03/02/23 0847  Weight: 85 kg 133.8 kg 88.8 kg    Recent Labs  Lab 02/25/23 1226 02/26/23 0108 02/27/23 0130 02/28/23 0048 03/01/23 0042 03/02/23 0112  BNP 1,037.5*  --   --   --   --   --   BUN 79* 79* 79* 81* 83* 80*  CREATININE 3.91* 3.94* 3.92* 3.98* 4.05* 4.06*  K 3.5 3.4* 3.6 3.9 4.1 3.8  MG  --  2.8*  --   --   --  2.5*     Hypokalemia-resolved.  CKD stage IV-V: Renal function stable at 3.98-4.05.  Monitor while on diuretics follow-up with nephrology as outpatient  Metabolic acidosis in the setting of CKD monitor bicarb Recent Labs    08/23/22 0000 10/04/22 1249 10/29/22 0000 01/22/23 0000 02/25/23 1226 02/26/23 0108 02/27/23 0130 02/28/23 0048 03/01/23 0042 03/02/23 0112  BUN 106* 126* 92* 60* 79* 79* 79* 81* 83* 80*  CREATININE 5.5* 6.53* 4.3* 3.0* 3.91* 3.94* 3.92* 3.98* 4.05* 4.06*  CO2 16 19* 15 18 18* 19* 20* 18* 20* 21*  Metabolic bone disease: continue Renvela  Anemia of chronic renal disease hb close to baseline or slightly lower> 8 to 10 mg.Previously as low as 6.3 g with 12/26/2022 needing blood transfusion.  Continue folic acid, iron supplement.   S/ paranesp 57.  Continue Procrit outpatient  Recent Labs  Lab 02/26/23 0108 02/27/23 0130 02/28/23 0048 03/01/23 0042 03/02/23 0112  HGB 8.8* 8.4* 8.2* 8.3* 8.8*  HCT 28.1* 27.0* 26.7*  26.7* 28.9*     Hypertension: Uncontrolled in the ED. currently stable cont Hydralazine 50 tid and Coreg low-dose.    Class I obesity with BMI 30.8 will benefit weight loss, sleep apnea evaluation  Sacral Pressure ulcer poa see below: Pressure Injury 03/28/22 Sacrum Mid Stage 1 -  Intact skin with non-blanchable redness of a localized area usually over a bony prominence. Area is darker and non-blanchable. Intact, clean, dry. (Active)  03/28/22 0126  Location: Sacrum  Location Orientation: Mid  Staging: Stage 1 -  Intact skin with non-blanchable redness of a localized area usually over a bony prominence.  Wound Description (Comments): Area is darker and non-blanchable. Intact, clean, dry.  Present on Admission: Yes  Dressing Type Foam - Lift dressing to assess site every shift 02/25/23 2005   Class I Obesity:Patient's Body mass index is 32.58 kg/m. : Will benefit with PCP follow-up, weight loss  healthy lifestyle and outpatient sleep evaluation.  Goals of care: Palliative care has been consulted currently full code-family has been educated to consider DNR/DNI understanding evidenced-based poor outcomes in similar hospitalized ptient, as a cause of arrest is likely associated with advanced chronic illness rather than easily reversible acute cardiopulmonary.  Has complex medical comorbidities.   DVT prophylaxis: heparin injection 5,000 Units Start: 02/25/23 1615 Code Status:   Code Status: Full Code Family Communication: plan of care discussed with patient at bedside. Patient status is: Inpatient because of CHF Level of care: Telemetry Cardiac   Dispo: The patient is from: HOME lives with daughter.  Daughter is hesitant to take her home today            Anticipated disposition: home tomorrow if tolerating oral torsemide   Objective: Vitals last 24 hrs: Vitals:   03/02/23 0500 03/02/23 0552 03/02/23 0847 03/02/23 0851  BP:  (!) 178/81  (!) 143/67  Pulse:  (!) 58  65  Resp:  20  18   Temp:  (!) 94 F (34.4 C)  (!) 97.3 F (36.3 C)  TempSrc:  Rectal  Oral  SpO2:  99%  97%  Weight: 133.8 kg  88.8 kg   Height:       Weight change: 48.8 kg  Physical Examination: General exam: AAox3, pleasant, elderly weak,older appearing HEENT:Oral mucosa moist, Ear/Nose WNL grossly, dentition normal. Respiratory system: bilaterally clear w/ diminished BS at base Cardiovascular system: S1 & S2 +, regular rate, JVD neg. Gastrointestinal system: Abdomen soft, obese,NT,ND,BS+ Nervous System:Alert, awake, moving extremities and grossly nonfocal Extremities: LE ankle edema ++, lower extremities warm Skin: No rashes,no icterus. MSK: Normal muscle bulk,tone, power .   Medications reviewed:  Scheduled Meds:  carvedilol  3.125 mg Oral BID WC   Chlorhexidine Gluconate Cloth  6 each Topical Daily   Chlorhexidine Gluconate Cloth  6 each Topical Q0600   folic acid  1 mg Oral Daily   heparin  5,000 Units Subcutaneous Q8H   hydrALAZINE  50 mg Oral Q8H   iron polysaccharides  150 mg Oral Daily   sevelamer carbonate  1,600 mg Oral TID WC  sodium bicarbonate  650 mg Oral BID   sodium chloride flush  3 mL Intravenous Q12H   torsemide  40 mg Oral BID   Continuous Infusions: Diet Order             Diet renal with fluid restriction Fluid restriction: 1200 mL Fluid; Room service appropriate? Yes; Fluid consistency: Thin  Diet effective now                   Intake/Output Summary (Last 24 hours) at 03/02/2023 1023 Last data filed at 03/02/2023 0852 Gross per 24 hour  Intake 360 ml  Output 1800 ml  Net -1440 ml    Net IO Since Admission: -4,749 mL [03/02/23 1023]  Wt Readings from Last 3 Encounters:  03/02/23 88.8 kg  08/18/22 84.5 kg  06/17/22 91.2 kg     Unresulted Labs (From admission, onward)     Start     Ordered   02/27/23 0500  Basic metabolic panel  Daily,   R      02/26/23 0754   02/27/23 0500  CBC  Daily,   R      02/26/23 0754          Data Reviewed: I  have personally reviewed following labs and imaging studies CBC: Recent Labs  Lab 02/25/23 1226 02/26/23 0108 02/27/23 0130 02/28/23 0048 03/01/23 0042 03/02/23 0112  WBC 4.1 4.0 3.8* 4.1 3.8* 3.4*  NEUTROABS 3.0  --   --   --   --   --   HGB 10.3* 8.8* 8.4* 8.2* 8.3* 8.8*  HCT 34.0* 28.1* 27.0* 26.7* 26.7* 28.9*  MCV 94.2 90.6 91.5 90.5 90.5 92.3  PLT 192 183 172 167 168 163   Basic Metabolic Panel: Recent Labs  Lab 02/26/23 0108 02/27/23 0130 02/28/23 0048 03/01/23 0042 03/02/23 0112  NA 140 139 137 137 137  K 3.4* 3.6 3.9 4.1 3.8  CL 111 110 109 108 107  CO2 19* 20* 18* 20* 21*  GLUCOSE 88 65* 82 87 104*  BUN 79* 79* 81* 83* 80*  CREATININE 3.94* 3.92* 3.98* 4.05* 4.06*  CALCIUM 7.1* 7.1* 6.9* 7.1* 7.3*  MG 2.8*  --   --   --  2.5*    No results found for this or any previous visit (from the past 240 hour(s)).  Antimicrobials: Anti-infectives (From admission, onward)    None      Culture/Microbiology None  Radiology Studies: VAS [place] UPPER EXTREMITY VENOUS DUPLEX  Result Date: 03/01/2023 UPPER VENOUS STUDY  Patient Name:  HARJOT GOVEA  Date of Exam:   02/28/2023 Medical Rec #: [id number]       Accession #:    [id number] Date of Birth: Nov 16, 1950      Patient Gender: F Patient Age:   23 years Exam Location:  [facility] Procedure:      VAS [place] UPPER EXTREMITY VENOUS DUPLEX Referring Phys: Enolia Koepke --------------------------------------------------------------------------------  Indications: Asymmetrical left upper extremity swelling Risk Factors: CHF. Comparison Study: No prior studies. Performing Technologist: Jean Rosenthal RDMS, RVT  Examination Guidelines: A complete evaluation includes B-mode imaging, spectral Doppler, color Doppler, and power Doppler as needed of all accessible portions of each vessel. Bilateral testing is considered an integral part of a complete examination. Limited examinations for reoccurring indications may be performed as noted.   Right Findings: +----------+------------+---------+-----------+----------+-------+ RIGHT     CompressiblePhasicitySpontaneousPropertiesSummary +----------+------------+---------+-----------+----------+-------+ Subclavian               Yes  Yes                      +----------+------------+---------+-----------+----------+-------+  Left Findings: +----------+------------+---------+-----------+----------+-------+ LEFT      CompressiblePhasicitySpontaneousPropertiesSummary +----------+------------+---------+-----------+----------+-------+ IJV           Full       Yes       Yes                      +----------+------------+---------+-----------+----------+-------+ Subclavian               Yes       Yes                      +----------+------------+---------+-----------+----------+-------+ Axillary      Full       Yes       Yes                      +----------+------------+---------+-----------+----------+-------+ Brachial      Full                                          +----------+------------+---------+-----------+----------+-------+ Radial        Full                                          +----------+------------+---------+-----------+----------+-------+ Ulnar         Full                                          +----------+------------+---------+-----------+----------+-------+ Cephalic      Full                                          +----------+------------+---------+-----------+----------+-------+ Basilic       Full                                          +----------+------------+---------+-----------+----------+-------+  Summary:  Right: No evidence of thrombosis in the subclavian.  Left: No evidence of deep vein thrombosis in the upper extremity. No evidence of superficial vein thrombosis in the upper extremity. Significant subcutaneous edema noted.  *See table(s) above for measurements and observations.  Diagnosing  physician: Gerarda Fraction Electronically signed by Gerarda Fraction on 03/01/2023 at 8:49:32 AM.    Final      LOS: 5 days   Lanae Boast, MD [facility]  03/02/2023, 10:23 AM

## 2023-03-02 NOTE — Progress Notes (Addendum)
Rounding Note    Patient Name: Kara Mullins Date of Encounter: 03/02/2023  [facility] Cardiologist: None   Subjective   Net -1.3 L yesterday, -4.6 L on admission.  Creatinine stable at 4.06.  BP 143/67.  Reports dyspnea improved   Inpatient Medications    Scheduled Meds:  carvedilol  3.125 mg Oral BID WC   Chlorhexidine Gluconate Cloth  6 each Topical Daily   Chlorhexidine Gluconate Cloth  6 each Topical Q0600   folic acid  1 mg Oral Daily   heparin  5,000 Units Subcutaneous Q8H   hydrALAZINE  50 mg Oral Q8H   iron polysaccharides  150 mg Oral Daily   sevelamer carbonate  1,600 mg Oral TID WC   sodium bicarbonate  650 mg Oral BID   sodium chloride flush  3 mL Intravenous Q12H   Continuous Infusions:  PRN Meds: acetaminophen **OR** acetaminophen, albuterol, calcium carbonate (dosed in mg elemental calcium), camphor-menthol **AND** hydrOXYzine, camphor-menthol, docusate sodium, feeding supplement (NEPRO CARB STEADY), hydrALAZINE, hydrocortisone cream, ondansetron **OR** ondansetron (ZOFRAN) IV, sorbitol   Vital Signs    Vitals:   03/02/23 0500 03/02/23 0552 03/02/23 0847 03/02/23 0851  BP:  (!) 178/81  (!) 143/67  Pulse:  (!) 58  65  Resp:  20  18  Temp:  (!) 94 F (34.4 C)  (!) 97.3 F (36.3 C)  TempSrc:  Rectal  Oral  SpO2:  99%  97%  Weight: 133.8 kg  88.8 kg   Height:        Intake/Output Summary (Last 24 hours) at 03/02/2023 0914 Last data filed at 03/02/2023 0852 Gross per 24 hour  Intake 360 ml  Output 1800 ml  Net -1440 ml       03/02/2023    8:47 AM 03/02/2023    5:00 AM 03/01/2023    5:24 AM  Last 3 Weights  Weight (lbs) 195 lb 12.8 oz 295 lb 187 lb 6.3 oz  Weight (kg) 88.814 kg 133.811 kg 85 kg      Telemetry    NSR - Personally Reviewed  ECG    No new ECG - Personally Reviewed  Physical Exam   GEN: No acute distress.   Neck: + JVD Cardiac: RRR, no murmurs, rubs, or gallops.  Respiratory: Clear to auscultation  bilaterally. GI: Soft, nontender, non-distended  MS: 2+ BLE edema Neuro:  Nonfocal  Psych: Normal affect   Labs    High Sensitivity Troponin:   Recent Labs  Lab 02/25/23 1226 02/25/23 1403  TROPONINIHS 23* 38*      Chemistry Recent Labs  Lab 02/26/23 0108 02/27/23 0130 02/28/23 0048 03/01/23 0042 03/02/23 0112  NA 140   < > 137 137 137  K 3.4*   < > 3.9 4.1 3.8  CL 111   < > 109 108 107  CO2 19*   < > 18* 20* 21*  GLUCOSE 88   < > 82 87 104*  BUN 79*   < > 81* 83* 80*  CREATININE 3.94*   < > 3.98* 4.05* 4.06*  CALCIUM 7.1*   < > 6.9* 7.1* 7.3*  MG 2.8*  --   --   --  2.5*  GFRNONAA 12*   < > 12* 11* 11*  ANIONGAP 10   < > 10 9 9    < > = values in this interval not displayed.     Lipids No results for input(s): "CHOL", "TRIG", "HDL", "LABVLDL", "LDLCALC", "CHOLHDL" in the last 168 hours.  Hematology Recent Labs  Lab 02/28/23 0048 03/01/23 0042 03/02/23 0112  WBC 4.1 3.8* 3.4*  RBC 2.95* 2.95* 3.13*  HGB 8.2* 8.3* 8.8*  HCT 26.7* 26.7* 28.9*  MCV 90.5 90.5 92.3  MCH 27.8 28.1 28.1  MCHC 30.7 31.1 30.4  RDW 17.8* 17.4* 17.5*  PLT 167 168 163    Thyroid No results for input(s): "TSH", "FREET4" in the last 168 hours.  BNP Recent Labs  Lab 02/25/23 1226  BNP 1,037.5*     DDimer No results for input(s): "DDIMER" in the last 168 hours.   Radiology    VAS [place] UPPER EXTREMITY VENOUS DUPLEX  Result Date: 03/01/2023 UPPER VENOUS STUDY  Patient Name:  Kara Mullins  Date of Exam:   02/28/2023 Medical Rec #: [id number]       Accession #:    [id number] Date of Birth: 03-22-1951      Patient Gender: F Patient Age:   28 years Exam Location:  [facility] Procedure:      VAS [place] UPPER EXTREMITY VENOUS DUPLEX Referring Phys: RAMESH KC --------------------------------------------------------------------------------  Indications: Asymmetrical left upper extremity swelling Risk Factors: CHF. Comparison Study: No prior studies. Performing Technologist: Jean Rosenthal  RDMS, RVT  Examination Guidelines: A complete evaluation includes B-mode imaging, spectral Doppler, color Doppler, and power Doppler as needed of all accessible portions of each vessel. Bilateral testing is considered an integral part of a complete examination. Limited examinations for reoccurring indications may be performed as noted.  Right Findings: +----------+------------+---------+-----------+----------+-------+ RIGHT     CompressiblePhasicitySpontaneousPropertiesSummary +----------+------------+---------+-----------+----------+-------+ Subclavian               Yes       Yes                      +----------+------------+---------+-----------+----------+-------+  Left Findings: +----------+------------+---------+-----------+----------+-------+ LEFT      CompressiblePhasicitySpontaneousPropertiesSummary +----------+------------+---------+-----------+----------+-------+ IJV           Full       Yes       Yes                      +----------+------------+---------+-----------+----------+-------+ Subclavian               Yes       Yes                      +----------+------------+---------+-----------+----------+-------+ Axillary      Full       Yes       Yes                      +----------+------------+---------+-----------+----------+-------+ Brachial      Full                                          +----------+------------+---------+-----------+----------+-------+ Radial        Full                                          +----------+------------+---------+-----------+----------+-------+ Ulnar         Full                                          +----------+------------+---------+-----------+----------+-------+  Cephalic      Full                                          +----------+------------+---------+-----------+----------+-------+ Basilic       Full                                           +----------+------------+---------+-----------+----------+-------+  Summary:  Right: No evidence of thrombosis in the subclavian.  Left: No evidence of deep vein thrombosis in the upper extremity. No evidence of superficial vein thrombosis in the upper extremity. Significant subcutaneous edema noted.  *See table(s) above for measurements and observations.  Diagnosing physician: Gerarda Fraction Electronically signed by Gerarda Fraction on 03/01/2023 at 8:49:32 AM.    Final     Cardiac Studies    Patient Profile     72 y.o. female with a hx of chronic combined systolic and diastolic heart failure, CKD stage IV, hypertension, lymphedema who is being seen 02/25/2023 for the evaluation of heart failure   Assessment & Plan    Acute on chronic diastolic heart failure: Presented with shortness of breath and worsening lower extremity edema.  Appears volume overloaded on exam.  Echo shows EF 50 to 55%, severe LVH, normal RV function, mild MR, mild AI -Reports improvement in symptoms with diuresis.  Plan per nephrology was to transition to PO torsemide, as unlikely to achieve resolution of her edema but her dyspnea has resolved.  Agree with plan, will transition to PO torsemide 40 mg BID today   CKD stage IV-V: Creatinine 4.06, has varied from 3.0-6.5 over the last 6 months.  Will need to closely monitor renal function with diuresis.  Nephrology consulted.   Hypertension: On hydralazine 50 mg 3 times daily and coreg 3.125 mg BID.  Appears controlled  For questions or updates, please contact [facility] Please consult [email or web address] for contact info under        Signed, [name], MD  03/02/2023, 9:14 AM

## 2023-03-02 NOTE — Discharge Summary (Signed)
Physician Discharge Summary  Kara Mullins VWU:[id number] DOB: [birth year] DOA: 02/25/2023  PCP: Caesar Bookman, NP  Admit date: 02/25/2023 Discharge date: 03/03/2023 Recommendations for Outpatient Follow-up:  Follow up with PCP in 1 weeks-call for appointment Follow-up with nephrology Please obtain BMP/CBC in one week  Discharge Dispo: home Discharge Condition: Stable Code Status:   Code Status: Full Code Diet recommendation:  Diet Order             Diet - low sodium heart healthy           Diet renal with fluid restriction Fluid restriction: 1200 mL Fluid; Room service appropriate? Yes; Fluid consistency: Thin  Diet effective now                 Brief/Interim Summary:72 yof w/ chronic diastolic CHF w/ lvef 55%,GAD, HTN, HLD,Obseity, lymphedema and stage IV b/l bun/creat 60/3.0 (01/22/23) previously in 90s/5 presented with shortness of breath, worsening edema. She went in for Retacrit injection on 3/14 and was given blood transfusion.  Her fluid balance has seemed off since.  She had been on torsemide but the dose was decreased because her renal function worsened.  She restarted the torsemide at 40 mg BID (instead of 80) but  her edema has worsened and her daughter noticed pitting edema in her flanks In the ED hypertensive 180-190, labs showed BNP 1037 troponin 23>, hemoglobin 10.3 g, BUN 79 creatinine 3.9. Chest x-ray bilateral pleural effusion left greater than right right effusion appears slightly larger compared to x-ray 08/18/2022. Patient was given IV Lasix and admitted for acute on chronic CHF-cardiology and nephrology consulted.  Patient manage is a high dose IV Lasix. Nephrology signed off> difficult to get swelling improved since she is non ambulatory and advised to assess volume status with orthopnea and shortness of breath which is improving. Cardiology following closely IV Lasix changed to torsemide 5/19, daughter would like her to monitor next 24 hours on oral torsemide  to make sure patient is tolerating and symptoms better prior to discharge Overall doing well on torsemide p.o. twice daily, she will be discharged on the same with instruction  to follow-up with PCP cardiology and nephrology  Discharge Diagnoses:  Principal Problem:   Acute on chronic diastolic CHF (congestive heart failure) (HCC) Active Problems:   CKD (chronic kidney disease) stage 5, GFR less than 15 ml/min (HCC)   Essential hypertension   Hyperlipidemia   Chronic kidney disease (CKD), stage IV (severe) (HCC)  Acute on chronic diastolic heart failure Extensive extremity swelling- neg for DVT in UE ( left): Lvef 50-55% w/ severe LVH, no WMA presenting w/ worsening of shortness of breath and extensive lower leg edema. Cardiology nephrology following, appreciate inputs. Continued on IV lasix 80 mg tid per nephro/cardio> then po torsemide from 5/19 Nephrology signed off> difficult to get swelling improved since she is non ambulatory and advised to assess volume status with orthopnea and shortness of breath which is improved.Cardiology following closely> changed to oral torsemide  and monitored 24 hrs and sogiun well- discussed w/ Dr Jacques Navy- pt ok for home today and fu cardio nephro.Net IO Since Admission: -5,769 mL [03/03/23 1157]  Filed Weights   03/01/23 0524 03/02/23 0500 03/02/23 0847  Weight: 85 kg 133.8 kg 88.8 kg    Recent Labs  Lab 02/25/23 1226 02/26/23 0108 02/27/23 0130 02/28/23 0048 03/01/23 0042 03/02/23 0112 03/03/23 0038  BNP 1,037.5*  --   --   --   --   --   --  BUN 79* 79* 79* 81* 83* 80* 80*  CREATININE 3.91* 3.94* 3.92* 3.98* 4.05* 4.06* 4.19*  K 3.5 3.4* 3.6 3.9 4.1 3.8 3.9  MG  --  2.8*  --   --   --  2.5*  --     Hypokalemia-resolved.  CKD stage IV-V: Renal function stable at 3.98-4.05.  Monitor while on diuretics follow-up with nephrology as outpatient - asked Daughter ot ensure bmp check in a week. Metabolic acidosis in the setting of CKD monitor  bicarb Recent Labs    10/04/22 1249 10/29/22 0000 01/22/23 0000 02/25/23 1226 02/26/23 0108 02/27/23 0130 02/28/23 0048 03/01/23 0042 03/02/23 0112 03/03/23 0038  BUN 126* 92* 60* 79* 79* 79* 81* 83* 80* 80*  CREATININE 6.53* 4.3* 3.0* 3.91* 3.94* 3.92* 3.98* 4.05* 4.06* 4.19*  CO2 19* 15 18 18* 19* 20* 18* 20* 21* 20*    Metabolic bone disease: continue Renvela  Anemia of chronic renal disease hb close to baseline or slightly lower> 8 to 10 mg.Previously as low as 6.3 g with 12/26/2022 needing blood transfusion.  Continue folic acid, iron supplement.   S/ paranesp 57.  Continue Procrit outpatient  Recent Labs  Lab 02/26/23 0108 02/27/23 0130 02/28/23 0048 03/01/23 0042 03/02/23 0112  HGB 8.8* 8.4* 8.2* 8.3* 8.8*  HCT 28.1* 27.0* 26.7* 26.7* 28.9*    Hypertension: Uncontrolled in the ED. currently stable cont Hydralazine 50 tid and Coreg low-dose.   Class I obesity with BMI 30.8 will benefit weight loss, sleep apnea evaluation Class I Obesity:Patient's Body mass index is 32.58 kg/m. : Will benefit with PCP follow-up, weight loss  healthy lifestyle and outpatient sleep evaluation.  Goals of care: Palliative care has been consulted currently full code-family has been educated to consider DNR/DNI understanding evidenced-based poor outcomes in similar hospitalized ptient, as a cause of arrest is likely associated with advanced chronic illness rather than easily reversible acute cardiopulmonary.  Has complex medical comorbidities.   Pressure Ulcer: Pressure Injury 03/28/22 Sacrum Mid Stage 1 -  Intact skin with non-blanchable redness of a localized area usually over a bony prominence. Area is darker and non-blanchable. Intact, clean, dry. (Active)  03/28/22 0126  Location: Sacrum  Location Orientation: Mid  Staging: Stage 1 -  Intact skin with non-blanchable redness of a localized area usually over a bony prominence.  Wound Description (Comments): Area is darker and  non-blanchable. Intact, clean, dry.  Present on Admission: Yes  Dressing Type Foam - Lift dressing to assess site every shift 03/02/23 1600    Consults: Nephrology Cardiology Subjective: Aaox3 resting well no shortness of breaht does have leg swelling Eager to go home today Daughter updated on phone  Discharge Exam: Vitals:   03/03/23 0712 03/03/23 1156  BP: (!) 167/66 (!) 154/63  Pulse: 66 62  Resp: 14 14  Temp: (!) 97.5 F (36.4 C) (!) 97.5 F (36.4 C)  SpO2: 96% 96%   General: Pt is alert, awake, not in acute distress Cardiovascular: RRR, S1/S2 +, no rubs, no gallops Respiratory: CTA bilaterally, no wheezing, no rhonchi Abdominal: Soft, NT, ND, bowel sounds + Extremities: no edema, no cyanosis  Discharge Instructions  Discharge Instructions     (HEART FAILURE PATIENTS) Call MD:  Anytime you have any of the following symptoms: 1) 3 pound weight gain in 24 hours or 5 pounds in 1 week 2) shortness of breath, with or without a dry hacking cough 3) swelling in the hands, feet or stomach 4) if you have to sleep on  extra pillows at night in order to breathe.   Complete by: As directed    Diet - low sodium heart healthy   Complete by: As directed    Discharge instructions   Complete by: As directed    Please call call MD or return to ER for similar or worsening recurring problem that brought you to hospital or if any fever,nausea/vomiting,abdominal pain, uncontrolled pain, chest pain,  shortness of breath or any other alarming symptoms.  Please follow-up your doctor as instructed in a week time and call the office for appointment.  Please avoid alcohol, smoking, or any other illicit substance and maintain healthy habits including taking your regular medications as prescribed.  You were cared for by a hospitalist during your hospital stay. If you have any questions about your discharge medications or the care you received while you were in the hospital after you are  discharged, you can call the unit and ask to speak with the hospitalist on call if the hospitalist that took care of you is not available.  Once you are discharged, your primary care physician will handle any further medical issues. Please note that NO REFILLS for any discharge medications will be authorized once you are discharged, as it is imperative that you return to your primary care physician (or establish a relationship with a primary care physician if you do not have one) for your aftercare needs so that they can reassess your need for medications and monitor your lab values   Discharge wound care:   Complete by: As directed    Off loading gel/air mattress   Increase activity slowly   Complete by: As directed       Allergies as of 03/03/2023       Reactions   Crestor [rosuvastatin] Other (See Comments)   "Felt like she couldn't swallow"   Nitrofuran Derivatives         Medication List     TAKE these medications    carvedilol 3.125 MG tablet Commonly known as: COREG Take 3.125 mg by mouth 2 (two) times daily with a meal.   cholecalciferol 25 MCG (1000 UNIT) tablet Commonly known as: VITAMIN D3 Take 1,000 Units by mouth daily.   feeding supplement Liqd Take 237 mLs by mouth 2 (two) times daily between meals.   ferrous sulfate 325 (65 FE) MG EC tablet Take 1 tablet (325 mg total) by mouth 2 (two) times daily.   folic acid 1 MG tablet Commonly known as: FOLVITE Take 1 tablet (1 mg total) by mouth daily. Start taking on: Mar 04, 2023   hydrALAZINE 50 MG tablet Commonly known as: APRESOLINE TAKE 1 TABLET(50 MG) BY MOUTH EVERY 8 HOURS What changed: See the new instructions.   MULTIVITAMIN ADULT PO Take 1 tablet by mouth daily.   Muscle Rub 10-15 % Crea Apply 1 Application topically daily as needed for muscle pain.   RETACRIT IJ Inject 20,000 Units as directed every 14 (fourteen) days.   sevelamer carbonate 800 MG tablet Commonly known as: RENVELA Take 800  mg by mouth every evening.   sodium bicarbonate 650 MG tablet Take 650 mg by mouth 2 (two) times daily.   Torsemide 40 MG Tabs Take 40 mg by mouth 2 (two) times daily. What changed:  how much to take when to take this               Durable Medical Equipment  (From admission, onward)  Start     Ordered   03/03/23 1017  For home use only DME Other see comment  Once       Comments: Gel mattress  Question:  Length of Need  Answer:  Lifetime   03/03/23 1017              Discharge Care Instructions  (From admission, onward)           Start     Ordered   03/03/23 0000  Discharge wound care:       Comments: Off loading gel/air mattress   03/03/23 0940            Follow-up Information     Joylene Grapes, NP Follow up on 03/20/2023.   Specialties: Cardiology, Family Medicine Why: Appointment at 10:55 AM Contact information: [address] [place] [address] [phone]         [name], [name], NP. Go on 03/05/2023.   Specialty: Family Medicine Why: @2 :00pm Contact information: [address] [place] [address] [phone]         [facility] Follow up.   Why: gel mattress to be delivered to the house Contact information: [address] [place] [address] [phone]                Allergies  Allergen Reactions   Crestor [Rosuvastatin] Other (See Comments)    "Felt like she couldn't swallow"   Nitrofuran Derivatives     The results of significant diagnostics from this hospitalization (including imaging, microbiology, ancillary and laboratory) are listed below for reference.    Microbiology: No results found for this or any previous visit (from the past 240 hour(s)).  Procedures/Studies: VAS [place] UPPER EXTREMITY VENOUS DUPLEX  Result Date: 03/01/2023 UPPER VENOUS STUDY  Patient Name:  Kara Mullins  Date of Exam:   02/28/2023 Medical Rec #: [id number]       Accession #:     [id number] Date of Birth: September 19, 1951      Patient Gender: F Patient Age:   12 years Exam Location:  [facility] Procedure:      VAS [place] UPPER EXTREMITY VENOUS DUPLEX Referring Phys: Navada Osterhout --------------------------------------------------------------------------------  Indications: Asymmetrical left upper extremity swelling Risk Factors: CHF. Comparison Study: No prior studies. Performing Technologist: Jean Rosenthal RDMS, RVT  Examination Guidelines: A complete evaluation includes B-mode imaging, spectral Doppler, color Doppler, and power Doppler as needed of all accessible portions of each vessel. Bilateral testing is considered an integral part of a complete examination. Limited examinations for reoccurring indications may be performed as noted.  Right Findings: +----------+------------+---------+-----------+----------+-------+ RIGHT     CompressiblePhasicitySpontaneousPropertiesSummary +----------+------------+---------+-----------+----------+-------+ Subclavian               Yes       Yes                      +----------+------------+---------+-----------+----------+-------+  Left Findings: +----------+------------+---------+-----------+----------+-------+ LEFT      CompressiblePhasicitySpontaneousPropertiesSummary +----------+------------+---------+-----------+----------+-------+ IJV           Full       Yes       Yes                      +----------+------------+---------+-----------+----------+-------+ Subclavian               Yes       Yes                      +----------+------------+---------+-----------+----------+-------+  Axillary      Full       Yes       Yes                      +----------+------------+---------+-----------+----------+-------+ Brachial      Full                                          +----------+------------+---------+-----------+----------+-------+ Radial        Full                                           +----------+------------+---------+-----------+----------+-------+ Ulnar         Full                                          +----------+------------+---------+-----------+----------+-------+ Cephalic      Full                                          +----------+------------+---------+-----------+----------+-------+ Basilic       Full                                          +----------+------------+---------+-----------+----------+-------+  Summary:  Right: No evidence of thrombosis in the subclavian.  Left: No evidence of deep vein thrombosis in the upper extremity. No evidence of superficial vein thrombosis in the upper extremity. Significant subcutaneous edema noted.  *See table(s) above for measurements and observations.  Diagnosing physician: Gerarda Fraction Electronically signed by Gerarda Fraction on 03/01/2023 at 8:49:32 AM.    Final    ECHOCARDIOGRAM COMPLETE  Result Date: 02/26/2023    ECHOCARDIOGRAM REPORT   Patient Name:   Kara Mullins Date of Exam: 02/26/2023 Medical Rec #:  [id number]      Height:       65.0 in Accession #:    [id number]     Weight:       185.2 lb Date of Birth:  1951/02/15     BSA:          1.914 m Patient Age:    71 years       BP:           174/79 mmHg Patient Gender: F              HR:           60 bpm. Exam Location:  Inpatient Procedure: 2D Echo, Cardiac Doppler, Color Doppler and Strain Analysis Indications:    CHF- Acute Diastolic  History:        Patient has prior history of Echocardiogram examinations, most                 recent 06/03/2022. CHF; Risk Factors:Hypertension, Dyslipidemia                 and Stage 3 Chronic Kidney Disease.  Sonographer:    Raeford Razor Referring Phys: [id number] CHRISTOPHER L SCHUMANN IMPRESSIONS  1. Left ventricular ejection  fraction, by estimation, is 50 to 55%. The left ventricle has low normal function. The left ventricle has no regional wall motion abnormalities. There is severe concentric left ventricular hypertrophy. Left  ventricular diastolic function could not be evaluated. The average left ventricular global longitudinal strain is -13.0 %. The global longitudinal strain is abnormal.  2. Right ventricular systolic function is normal. The right ventricular size is normal. Mildly increased right ventricular wall thickness. There is normal pulmonary artery systolic pressure.  3. Left atrial size was moderately dilated.  4. Large pleural effusion in both left and right lateral regions.  5. The mitral valve is abnormal. Mild mitral valve regurgitation. Moderate mitral annular calcification.  6. The aortic valve is tricuspid. There is mild calcification of the aortic valve. There is moderate thickening of the aortic valve. Aortic valve regurgitation is mild. Aortic valve sclerosis/calcification is present, without any evidence of aortic stenosis.  7. The inferior vena cava is normal in size with <50% respiratory variability, suggesting right atrial pressure of 8 mmHg.  8. Cannot exclude a small PFO. Comparison(s): No significant change from prior study. Prior images reviewed side by side. Conclusion(s)/Recommendation(s): Consider future nuclear scintigraphy study to rule out cardiac amyloisosis if clinically indicated. FINDINGS  Left Ventricle: No systolic anterior motion of the mitral valve, no LVOT gradient. Abnormal apical to basal strain ratio, 2.5/1. Left ventricular ejection fraction, by estimation, is 50 to 55%. The left ventricle has low normal function. The left ventricle has no regional wall motion abnormalities. The average left ventricular global longitudinal strain is -13.0 %. The global longitudinal strain is abnormal. The left ventricular internal cavity size was normal in size. There is severe concentric left ventricular hypertrophy. Left ventricular diastolic function could not be evaluated due to mitral annular calcification (moderate or greater). Left ventricular diastolic function could not be evaluated. Right  Ventricle: The right ventricular size is normal. Mildly increased right ventricular wall thickness. Right ventricular systolic function is normal. There is normal pulmonary artery systolic pressure. The tricuspid regurgitant velocity is 2.08 m/s, and with an assumed right atrial pressure of 8 mmHg, the estimated right ventricular systolic pressure is 25.3 mmHg. Left Atrium: Left atrial size was moderately dilated. Right Atrium: Right atrial size was normal in size. Pericardium: Trivial pericardial effusion is present. The pericardial effusion is posterior to the left ventricle. Mitral Valve: The mitral valve is abnormal. Moderate mitral annular calcification. Mild mitral valve regurgitation. Tricuspid Valve: The tricuspid valve is normal in structure. Tricuspid valve regurgitation is mild . No evidence of tricuspid stenosis. Aortic Valve: The aortic valve is tricuspid. There is mild calcification of the aortic valve. There is moderate thickening of the aortic valve. There is mild aortic valve annular calcification. Aortic valve regurgitation is mild. Aortic regurgitation PHT  measures 437 msec. Aortic valve sclerosis/calcification is present, without any evidence of aortic stenosis. Aortic valve mean gradient measures 4.0 mmHg. Aortic valve peak gradient measures 6.9 mmHg. Aortic valve area, by VTI measures 1.92 cm. Pulmonic Valve: The pulmonic valve was grossly normal. Pulmonic valve regurgitation is mild. No evidence of pulmonic stenosis. Aorta: The aortic root and ascending aorta are structurally normal, with no evidence of dilitation. Venous: The inferior vena cava is normal in size with less than 50% respiratory variability, suggesting right atrial pressure of 8 mmHg. IAS/Shunts: The interatrial septum appears to be lipomatous. Cannot exclude a small PFO. Additional Comments: There is a large pleural effusion in both left and right lateral regions.  LEFT VENTRICLE PLAX 2D  LVIDd:         4.10 cm     Diastology  LVIDs:         3.20 cm     LV e' medial:    3.92 cm/s LV PW:         2.10 cm     LV E/e' medial:  22.7 LV IVS:        1.20 cm     LV e' lateral:   5.44 cm/s LVOT diam:     1.90 cm     LV E/e' lateral: 16.3 LV SV:         79 LV SV Index:   41          2D Longitudinal Strain LVOT Area:     2.84 cm    2D Strain GLS Avg:     -13.0 %  LV Volumes (MOD) LV vol d, MOD A2C: 97.2 ml LV vol d, MOD A4C: 91.0 ml LV vol s, MOD A2C: 52.5 ml LV vol s, MOD A4C: 45.7 ml LV SV MOD A2C:     44.7 ml LV SV MOD A4C:     91.0 ml LV SV MOD BP:      43.1 ml RIGHT VENTRICLE             IVC RV Basal diam:  2.80 cm     IVC diam: 1.90 cm RV S prime:     10.20 cm/s TAPSE (M-mode): 2.6 cm LEFT ATRIUM             Index        RIGHT ATRIUM           Index LA diam:        2.55 cm 1.33 cm/m   RA Area:     12.20 cm LA Vol (A2C):   94.9 ml 49.57 ml/m  RA Volume:   31.40 ml  16.40 ml/m LA Vol (A4C):   53.5 ml 27.94 ml/m LA Biplane Vol: 77.6 ml 40.53 ml/m  AORTIC VALVE AV Area (Vmax):    2.34 cm AV Area (Vmean):   2.06 cm AV Area (VTI):     1.92 cm AV Vmax:           131.00 cm/s AV Vmean:          96.500 cm/s AV VTI:            0.413 m AV Peak Grad:      6.9 mmHg AV Mean Grad:      4.0 mmHg LVOT Vmax:         108.00 cm/s LVOT Vmean:        70.000 cm/s LVOT VTI:          0.280 m LVOT/AV VTI ratio: 0.68 AI PHT:            437 msec  AORTA Ao Root diam: 3.10 cm Ao Asc diam:  3.20 cm MITRAL VALVE                TRICUSPID VALVE MV Area (PHT): 3.60 cm     TR Peak grad:   17.3 mmHg MV Decel Time: 211 msec     TR Vmax:        208.00 cm/s MV E velocity: 88.80 cm/s MV A velocity: 109.00 cm/s  SHUNTS MV E/A ratio:  0.81         Systemic VTI:  0.28 m  Systemic Diam: 1.90 cm Riley Lam MD Electronically signed by Riley Lam MD Signature Date/Time: 02/26/2023/4:31:56 PM    Final    DG Chest Portable 1 View  Result Date: 02/25/2023 CLINICAL DATA:  Shortness of breath EXAM: PORTABLE CHEST 1 VIEW COMPARISON:   X-ray 08/18/2022 FINDINGS: Overlapping cardiac leads. Enlarged cardiopericardial silhouette with calcified tortuous aorta. Persistent bilateral pleural effusions, left-greater-than-right. The right effusion is slightly larger today. No pneumothorax. Central vascular congestion. Curvature of the spine with degenerative changes. Film is rotated to the left. Osteopenia IMPRESSION: Bilateral pleural effusions, left-greater-than-right. The right effusion appears slightly larger today. Enlarged heart with vascular congestion. Electronically Signed   By: Karen Kays M.D.   On: 02/25/2023 12:45    Labs: BNP (last 3 results) Recent Labs    06/02/22 0311 08/14/22 1936 02/25/23 1226  BNP 955.8* 583.4* 1,037.5*   Basic Metabolic Panel: Recent Labs  Lab 02/26/23 0108 02/27/23 0130 02/28/23 0048 03/01/23 0042 03/02/23 0112 03/03/23 0038  NA 140 139 137 137 137 134*  K 3.4* 3.6 3.9 4.1 3.8 3.9  CL 111 110 109 108 107 105  CO2 19* 20* 18* 20* 21* 20*  GLUCOSE 88 65* 82 87 104* 91  BUN 79* 79* 81* 83* 80* 80*  CREATININE 3.94* 3.92* 3.98* 4.05* 4.06* 4.19*  CALCIUM 7.1* 7.1* 6.9* 7.1* 7.3* 7.1*  MG 2.8*  --   --   --  2.5*  --    Recent Labs  Lab 02/25/23 1226 02/26/23 0108 02/27/23 0130 02/28/23 0048 03/01/23 0042 03/02/23 0112  WBC 4.1 4.0 3.8* 4.1 3.8* 3.4*  NEUTROABS 3.0  --   --   --   --   --   HGB 10.3* 8.8* 8.4* 8.2* 8.3* 8.8*  HCT 34.0* 28.1* 27.0* 26.7* 26.7* 28.9*  MCV 94.2 90.6 91.5 90.5 90.5 92.3  PLT 192 183 172 167 168 163      Component Value Date/Time   COLORURINE YELLOW (A) 10/04/2022 1151   APPEARANCEUR TURBID (A) 10/04/2022 1151   LABSPEC 1.012 10/04/2022 1151   PHURINE 8.0 10/04/2022 1151   GLUCOSEU NEGATIVE 10/04/2022 1151   HGBUR NEGATIVE 10/04/2022 1151   BILIRUBINUR Negative 12/05/2022 1454   KETONESUR NEGATIVE 10/04/2022 1151   PROTEINUR Positive (A) 12/05/2022 1454   PROTEINUR >=300 (A) 10/04/2022 1151   UROBILINOGEN 0.2 12/05/2022 1454    UROBILINOGEN 0.2 11/19/2019 2004   NITRITE Negative 12/05/2022 1454   NITRITE NEGATIVE 10/04/2022 1151   LEUKOCYTESUR Moderate (2+) (A) 12/05/2022 1454   LEUKOCYTESUR LARGE (A) 10/04/2022 1151   Sepsis Labs Recent Labs  Lab 02/27/23 0130 02/28/23 0048 03/01/23 0042 03/02/23 0112  WBC 3.8* 4.1 3.8* 3.4*   Microbiology No results found for this or any previous visit (from the past 240 hour(s)).   Time coordinating discharge: 35 minutes  SIGNED: Lanae Boast, MD  [facility] 03/03/2023, 11:57 AM  If 7PM-7AM, please contact night-coverage [email or web address]

## 2023-03-02 NOTE — Plan of Care (Signed)
  Problem: Education: Goal: Ability to demonstrate management of disease process will improve Outcome: Progressing Goal: Ability to verbalize understanding of medication therapies will improve Outcome: Progressing Goal: Individualized Educational Video(s) Outcome: Progressing   Problem: Activity: Goal: Capacity to carry out activities will improve Outcome: Progressing   Problem: Cardiac: Goal: Ability to achieve and maintain adequate cardiopulmonary perfusion will improve Outcome: Progressing   Problem: Education: Goal: Knowledge of General Education information will improve Description: Including pain rating scale, medication(s)/side effects and non-pharmacologic comfort measures Outcome: Progressing   Problem: Health Behavior/Discharge Planning: Goal: Ability to manage health-related needs will improve Outcome: Progressing   Problem: Clinical Measurements: Goal: Ability to maintain clinical measurements within normal limits will improve Outcome: Progressing Goal: Will remain free from infection Outcome: Progressing Goal: Diagnostic test results will improve Outcome: Progressing Goal: Respiratory complications will improve Outcome: Progressing Goal: Cardiovascular complication will be avoided Outcome: Progressing   Problem: Activity: Goal: Risk for activity intolerance will decrease Outcome: Progressing   Problem: Coping: Goal: Level of anxiety will decrease Outcome: Progressing   Problem: Elimination: Goal: Will not experience complications related to bowel motility Outcome: Progressing Goal: Will not experience complications related to urinary retention Outcome: Progressing   Problem: Safety: Goal: Ability to remain free from injury will improve Outcome: Progressing   Problem: Skin Integrity: Goal: Risk for impaired skin integrity will decrease Outcome: Progressing   

## 2023-03-02 NOTE — Progress Notes (Signed)
MEWS Progress Note  Patient Details Name: Kara Mullins MRN: [id number] DOB: December 14, 1950 Today's Date: 03/02/2023   MEWS Flowsheet Documentation:  Assess: MEWS Score Temp: (!) 94 F (34.4 C) BP: (!) 178/81 MAP (mmHg): 102 Pulse Rate: (!) 58 ECG Heart Rate: (!) 58 Resp: 18 Level of Consciousness: Alert SpO2: 99 % O2 Device: Room Air Assess: MEWS Score MEWS Temp: 2 MEWS Systolic: 0 MEWS Pulse: 0 MEWS RR: 0 MEWS LOC: 0 MEWS Score: 2 MEWS Score Color: Yellow Assess: SIRS CRITERIA SIRS Temperature : 1 SIRS Respirations : 0 SIRS Pulse: 0 SIRS WBC: 1 SIRS Score Sum : 2 SIRS Temperature : 1 SIRS Pulse: 0 SIRS Respirations : 0 SIRS WBC: 1 SIRS Score Sum : 2 Assess: if the MEWS score is Yellow or Red Were vital signs taken at a resting state?: Yes Focused Assessment: No change from prior assessment Does the patient meet 2 or more of the SIRS criteria?: Yes Does the patient have a confirmed or suspected source of infection?: No MEWS guidelines implemented : Yes, yellow Treat MEWS Interventions: Considered administering scheduled or prn medications/treatments as ordered Take Vital Signs Increase Vital Sign Frequency : Yellow: Q2hr x1, continue Q4hrs until patient remains green for 12hrs Escalate MEWS: Escalate: Yellow: Discuss with charge nurse and consider notifying provider and/or RRT Notify: Charge Nurse/RN Name of Charge Nurse/RN Notified: Merlyn Albert, Microbiologist Notification Provider Name/Title: J. Howerter, DO Date Provider Notified: 03/02/23 Time Provider Notified: [phone] Method of Notification: Page Notification Reason: Other (Comment) (Yellow MEWS for Temp 39F) Provider response: No new orders Date of Provider Response: 03/02/23 Time of Provider Response: [id number] Notify: Rapid Response Name of Rapid Response RN Notified: ? Date Rapid Response Notified: 02/26/23 Time Rapid Response Notified: 1636      Jahvon Gosline Blanche C Tye Vigo 03/02/2023, 6:32 AM

## 2023-03-03 DIAGNOSIS — I5033 Acute on chronic diastolic (congestive) heart failure: Secondary | ICD-10-CM | POA: Diagnosis not present

## 2023-03-03 LAB — BASIC METABOLIC PANEL
Anion gap: 9 (ref 5–15)
BUN: 80 mg/dL — ABNORMAL HIGH (ref 8–23)
CO2: 20 mmol/L — ABNORMAL LOW (ref 22–32)
Calcium: 7.1 mg/dL — ABNORMAL LOW (ref 8.9–10.3)
Chloride: 105 mmol/L (ref 98–111)
Creatinine, Ser: 4.19 mg/dL — ABNORMAL HIGH (ref 0.44–1.00)
GFR, Estimated: 11 mL/min — ABNORMAL LOW (ref >60)
Glucose, Bld: 91 mg/dL (ref 70–99)
Potassium: 3.9 mmol/L (ref 3.5–5.1)
Sodium: 134 mmol/L — ABNORMAL LOW (ref 135–145)

## 2023-03-03 MED ORDER — TORSEMIDE 40 MG PO TABS: 40.0000 mg | ORAL_TABLET | Freq: Two times a day (BID) | ORAL | 0 refills | Status: DC

## 2023-03-03 MED ORDER — FERROUS SULFATE 325 (65 FE) MG PO TBEC: 325.0000 mg | DELAYED_RELEASE_TABLET | Freq: Two times a day (BID) | ORAL | 0 refills | Status: DC

## 2023-03-03 MED ORDER — FOLIC ACID 1 MG PO TABS: 1.0000 mg | ORAL_TABLET | Freq: Every day | ORAL | 0 refills | Status: AC

## 2023-03-03 NOTE — Plan of Care (Signed)

## 2023-03-03 NOTE — Care Management Important Message (Signed)
Important Message  Patient Details  Name: Kara Mullins MRN: [id number] Date of Birth: Nov 15, 1950   Medicare Important Message Given:  Yes     Renie Ora 03/03/2023, 10:15 AM

## 2023-03-03 NOTE — TOC Transition Note (Addendum)
Transition of Care [facility]) - CM/SW Discharge Note   Patient Details  Name: Kathiria Parthasarathy MRN: [id number] Date of Birth: 1950/12/16  Transition of Care [facility]) CM/SW Contact:  [name], RN Phone Number: 03/03/2023, 10:09 AM   Clinical Narrative:    For dc today, daughter to transport home.  [facility] notified of dc. Daughter states they would like to have another gel or air mattress.  She does not have a preference of the agency.  She states they have  a old gel mattress from [employer]. NCM made referral to [facility] for the gel mattress he states he will get this information sent in so this can be delivered to the home.     Final next level of care: Home w Home Health Services Barriers to Discharge: No Barriers Identified   Patient Goals and CMS Choice CMS Medicare.gov Compare Post Acute Care list provided to:: Patient Choice offered to / list presented to : Patient  Discharge Placement                         Discharge Plan and Services Additional resources added to the After Visit Summary for   In-house Referral: NA Discharge Planning Services: CM Consult Post Acute Care Choice: Home Health, Resumption of Svcs/PTA Provider          DME Arranged: N/A DME Agency: NA       HH Arranged: RN, PT, OT HH Agency: [facility] Date HH Agency Contacted: 02/26/23 Time HH Agency Contacted: 1030 Representative spoke with at [facility] Agency: [name]  Social Determinants of Health (SDOH) Interventions SDOH Screenings   Food Insecurity: No Food Insecurity (02/26/2023)  Housing: Low Risk  (02/26/2023)  Transportation Needs: No Transportation Needs (02/26/2023)  Utilities: Not At Risk (02/26/2023)  Alcohol Screen: Low Risk  (06/11/2022)  Depression (PHQ2-9): Low Risk  (01/27/2023)  Financial Resource Strain: Low Risk  (06/11/2022)  Tobacco Use: Low Risk  (02/25/2023)     Readmission Risk Interventions    02/26/2023    2:53 PM 06/17/2022    3:36 PM 06/17/2022     3:34 PM  Readmission Risk Prevention Plan  Transportation Screening Complete  Complete  HRI or Home Care Consult Complete    Social Work Consult for Recovery Care Planning/Counseling Complete    Palliative Care Screening Not Applicable    Medication Review Oceanographer) Referral to Pharmacy Complete Complete  PCP or Specialist appointment within 3-5 days of discharge  Complete Complete  HRI or Home Care Consult  Complete   SW Recovery Care/Counseling Consult  Complete   Palliative Care Screening  Not Applicable   Skilled Nursing Facility  Not Applicable

## 2023-03-03 NOTE — TOC Progression Note (Signed)
Transition of Care [facility]) - Progression Note    Patient Details  Name: Kara Mullins MRN: [id number] Date of Birth: [birth year]  Transition [facility] Care [facility]) CM/SW Contact  [name], RN Phone Number: 03/03/2023, 10:05 AM  Clinical Narrative:    Patient is for dc today, NCM spoke with patient and daughter , Daughter states she will be transporting patient home because patient does not want to go in the ambulance.  Daughter will get her father to assist her with getting patient in the car so she states she will be here in a couple of hours .  Patient would like to continue with [facility] , NCM notified Barbara Cower that patient is for dc today.  Daughter and patient states they do not need a hoyer lift that was rec by PT eval.      Expected Discharge Plan: Home w Home Health Services Barriers to Discharge: Continued Medical Work up  Expected Discharge Plan and Services In-house Referral: NA Discharge Planning Services: CM Consult Post Acute Care Choice: Home Health, Resumption of Svcs/PTA Provider Living arrangements for the past 2 months: Single Family Home Expected Discharge Date: 03/03/23                 DME Agency: NA         HH Agency: [facility] Date HH Agency Contacted: 02/26/23 Time HH Agency Contacted: 1030 Representative spoke with at [facility] Agency: [name]   Social Determinants of Health (SDOH) Interventions SDOH Screenings   Food Insecurity: No Food Insecurity (02/26/2023)  Housing: Low Risk  (02/26/2023)  Transportation Needs: No Transportation Needs (02/26/2023)  Utilities: Not At Risk (02/26/2023)  Alcohol Screen: Low Risk  (06/11/2022)  Depression (PHQ2-9): Low Risk  (01/27/2023)  Financial Resource Strain: Low Risk  (06/11/2022)  Tobacco Use: Low Risk  (02/25/2023)    Readmission Risk Interventions    02/26/2023    2:53 PM 06/17/2022    3:36 PM 06/17/2022    3:34 PM  Readmission Risk Prevention Plan  Transportation Screening Complete   Complete  HRI or Home Care Consult Complete    Social Work Consult for Recovery Care Planning/Counseling Complete    Palliative Care Screening Not Applicable    Medication Review Oceanographer) Referral to Pharmacy Complete Complete  PCP or Specialist appointment within 3-5 days of discharge  Complete Complete  HRI or Home Care Consult  Complete   SW Recovery Care/Counseling Consult  Complete   Palliative Care Screening  Not Applicable   Skilled Nursing Facility  Not Applicable

## 2023-03-03 NOTE — Progress Notes (Signed)
Discharge instructions (including medications) discussed with and copy provided to patient/caregiver  Patient has a chronic foley and is going home with it.,  It was emptied and charted and accesed.

## 2023-03-03 NOTE — Progress Notes (Signed)
Heart Failure Navigator Progress Note  Assessed for Heart & Vascular TOC clinic readiness.  Patient does not meet criteria due to EF 50-55% HFpEF, patient non-ambulatory, will be going home to daughters house. Has a [facility] appointment scheduled for 03/20/2023. .   Navigator available for reassessment of patient.   Rhae Hammock, BSN, Scientist, clinical (histocompatibility and immunogenetics) Only

## 2023-03-04 ENCOUNTER — Telehealth: Payer: Self-pay

## 2023-03-04 DIAGNOSIS — Z993 Dependence on wheelchair: Secondary | ICD-10-CM | POA: Diagnosis not present

## 2023-03-04 DIAGNOSIS — E785 Hyperlipidemia, unspecified: Secondary | ICD-10-CM | POA: Diagnosis not present

## 2023-03-04 DIAGNOSIS — M25561 Pain in right knee: Secondary | ICD-10-CM | POA: Diagnosis not present

## 2023-03-04 DIAGNOSIS — I132 Hypertensive heart and chronic kidney disease with heart failure and with stage 5 chronic kidney disease, or end stage renal disease: Secondary | ICD-10-CM | POA: Diagnosis not present

## 2023-03-04 DIAGNOSIS — F411 Generalized anxiety disorder: Secondary | ICD-10-CM | POA: Diagnosis not present

## 2023-03-04 DIAGNOSIS — I5032 Chronic diastolic (congestive) heart failure: Secondary | ICD-10-CM | POA: Diagnosis not present

## 2023-03-04 DIAGNOSIS — Z79899 Other long term (current) drug therapy: Secondary | ICD-10-CM | POA: Diagnosis not present

## 2023-03-04 DIAGNOSIS — D631 Anemia in chronic kidney disease: Secondary | ICD-10-CM | POA: Diagnosis not present

## 2023-03-04 DIAGNOSIS — Z466 Encounter for fitting and adjustment of urinary device: Secondary | ICD-10-CM | POA: Diagnosis not present

## 2023-03-04 DIAGNOSIS — Z87440 Personal history of urinary (tract) infections: Secondary | ICD-10-CM | POA: Diagnosis not present

## 2023-03-04 DIAGNOSIS — N186 End stage renal disease: Secondary | ICD-10-CM | POA: Diagnosis not present

## 2023-03-04 DIAGNOSIS — M1990 Unspecified osteoarthritis, unspecified site: Secondary | ICD-10-CM | POA: Diagnosis not present

## 2023-03-04 NOTE — Transitions of Care (Post Inpatient/ED Visit) (Signed)
03/04/2023  Name: Kara Mullins MRN: [id number] DOB: 1951/09/20  Today's TOC FU Call Status: Today's TOC FU Call Status:: Successful TOC FU Call Competed TOC FU Call Complete Date: 03/04/23  Transition Care Management Follow-up Telephone Call Date of Discharge: 03/03/23 Discharge Facility: [facility]) Type of Discharge: Inpatient Admission Primary Inpatient Discharge Diagnosis:: "acute on chronic HF" How have you been since you were released from the hospital?: Better (Daughter-Terrra staes pt doing well-BP has been in the 150s prior to meds given. Foley in place-pt has put out 1200cc of yellow urine today. Appetite fair. Pt remains very weak/deconditoned-primarily chairbound) Any questions or concerns?: No  Items Reviewed: Did you receive and understand the discharge instructions provided?: Yes Medications obtained,verified, and reconciled?: Yes (Medications Reviewed) Any new allergies since your discharge?: No Dietary orders reviewed?: Yes Type of Diet Ordered:: low salt/heart healthy Do you have support at home?: Yes People in Home: child(ren), adult Name of Support/Comfort Primary Source: Terra-daughter  Medications Reviewed Today: Medications Reviewed Today     Reviewed by Charlyn Minerva, RN (Registered Nurse) on 03/04/23 at 1307  Med List Status: <None>   Medication Order Taking? Sig Documenting Provider Last Dose Status Informant  carvedilol (COREG) 3.125 MG tablet [id number] Yes Take 3.125 mg by mouth 2 (two) times daily with a meal. [provider] Taking Active Child  cholecalciferol (VITAMIN D3) 25 MCG (1000 UNIT) tablet [id number] Yes Take 1,000 Units by mouth daily. [provider] Taking Active Child  Epoetin Alfa-epbx (RETACRIT IJ) [id number] Yes Inject 20,000 Units as directed every 14 (fourteen) days. [provider] Taking Active Child  feeding supplement (ENSURE ENLIVE / ENSURE PLUS) LIQD [id number] Yes Take 237 mLs by  mouth 2 (two) times daily between meals. Barnetta Chapel, MD Taking Active Child  ferrous sulfate 325 (65 FE) MG EC tablet [id number] Yes Take 1 tablet (325 mg total) by mouth 2 (two) times daily. Lanae Boast, MD Taking Active   folic acid (FOLVITE) 1 MG tablet [id number] Yes Take 1 tablet (1 mg total) by mouth daily. Lanae Boast, MD Taking Active   hydrALAZINE (APRESOLINE) 50 MG tablet [id number] Yes TAKE 1 TABLET(50 MG) BY MOUTH EVERY 8 HOURS  Patient taking differently: Take 25 mg by mouth in the morning and at bedtime.   [name], [name], NP Taking Active Child  Menthol-Methyl Salicylate (MUSCLE RUB) 10-15 % CREA [id number] Yes Apply 1 Application topically daily as needed for muscle pain. [provider] Taking Active Child  Multiple Vitamin (MULTIVITAMIN ADULT PO) [id number] Yes Take 1 tablet by mouth daily. [provider] Taking Active Child  sevelamer carbonate (RENVELA) 800 MG tablet [id number] Yes Take 800 mg by mouth every evening. [provider] Taking Active Child           Med Note Hal Hope, [facility] Aug 14, 2022  8:08 PM) Pt's family member claim Pt is supposed to take this three times a day but has only been taking once a day in the evening.  sodium bicarbonate 650 MG tablet [id number] Yes Take 650 mg by mouth 2 (two) times daily. [provider] Taking Active Child  torsemide 40 MG TABS [id number] Yes Take 40 mg by mouth 2 (two) times daily. Lanae Boast, MD Taking Active             Home Care and Equipment/Supplies: Were Home Health Services Ordered?: Yes Name of Home Health Agency:: [facility] Has Agency set up a time to come to your  home?: Yes First Home Health Visit Date: 03/04/23 (Daughter states HHRN called today and will possibly be coming out later today) Any new equipment or medical supplies ordered?: Yes Name of Medical supply agency?: [employer]-gel mattress Were you able to get the equipment/medical supplies?: No (Daughter states  mattress has not been delivered-advised of insurance auth process-daughter has contact info and will call to follow up on approval and delivery) Do you have any questions related to the use of the equipment/supplies?: No  Functional Questionnaire: Do you need assistance with bathing/showering or dressing?: Yes Do you need assistance with meal preparation?: Yes Do you need assistance with eating?: No Do you have difficulty maintaining continence: Yes Do you need assistance with getting out of bed/getting out of a chair/moving?: Yes Do you have difficulty managing or taking your medications?: Yes  Follow up appointments reviewed: PCP Follow-up appointment confirmed?: Yes Date of PCP follow-up appointment?: 03/05/23 (daughter requested in-person visit be changed to mychart visit due to diffculty getting pt out of the home-care guide assisted with request) Follow-up Provider: [name] [facility] Follow-up appointment confirmed?: Yes Date of Specialist follow-up appointment?: 03/20/23 Follow-Up Specialty Provider:: Bernadene Person Do you need transportation to your follow-up appointment?: No (Daughter unsure about being able to get pt to appts-she will explore options including contacting insurance provider to see if tranpsortation services covered-aware to alert [facility] staff if resources/assistance needed) Do you understand care options if your condition(s) worsen?: Yes-patient verbalized understanding   TOC Interventions Today    Flowsheet Row Most Recent Value  TOC Interventions   TOC Interventions Discussed/Reviewed TOC Interventions Discussed, Arranged PCP follow up within 7 days/Care Guide scheduled  [appt changed to mychart visit per dtr request since pt unable to walk]      Interventions Today    Flowsheet Row Most Recent Value  General Interventions   General Interventions Discussed/Reviewed General Interventions Discussed, Doctor Visits  Doctor Visits  Discussed/Reviewed Doctor Visits Discussed, PCP, Specialist  PCP/Specialist Visits Compliance with follow-up visit  Education Interventions   Education Provided Provided Education  Provided Verbal Education On Nutrition, When to see the doctor, Medication  Nutrition Interventions   Nutrition Discussed/Reviewed Nutrition Discussed, Adding fruits and vegetables, Increaing proteins, Decreasing salt  Pharmacy Interventions   Pharmacy Dicussed/Reviewed Pharmacy Topics Discussed, Medications and their functions  Safety Interventions   Safety Discussed/Reviewed Safety Discussed, Fall Risk, Home Safety  Home Safety Assistive Devices  [w/c ,walker, BSC,hospital bed]        Antionette Fairy, [facility] Care Management Care Management Community Coordinator Direct Phone: [phone] Toll Free: [phone] Fax: [phone]

## 2023-03-05 ENCOUNTER — Encounter: Payer: Self-pay | Admitting: Adult Health

## 2023-03-05 ENCOUNTER — Telehealth (INDEPENDENT_AMBULATORY_CARE_PROVIDER_SITE_OTHER): Payer: Medicare (Managed Care) | Admitting: Adult Health

## 2023-03-05 VITALS — BP 160/91 | HR 70 | Temp 97.0°F | Ht 65.0 in | Wt 195.0 lb

## 2023-03-05 DIAGNOSIS — N185 Chronic kidney disease, stage 5: Secondary | ICD-10-CM

## 2023-03-05 DIAGNOSIS — I5033 Acute on chronic diastolic (congestive) heart failure: Secondary | ICD-10-CM

## 2023-03-05 NOTE — Progress Notes (Signed)
This service is provided via telemedicine  No vital signs collected/recorded due to the encounter was a telemedicine visit.   Location of patient (ex: home, work):  home  Patient consents to a telephone visit:  yes  Location of the provider (ex: office, home):  [facility] [facility]  Names of all persons participating in the telemedicine service and their role in the encounter:  patient, daughter Fanny Dance S.,NP   Time spent on call:  5 minutes  She is laying  bed with no signs of distress present. She had been hospitalized for acute congestive heart failure. She had increased shortness of breath. She was treated with IV lasix then transitioned to po torsemide. Today she states that she is feeling good. She had been wrapping her legs prior to her hospitalization; will restart her wraps.  Will not make any changes at this time.   Time spent with patient < 5 minutes

## 2023-03-06 ENCOUNTER — Telehealth: Payer: Self-pay

## 2023-03-06 NOTE — Telephone Encounter (Signed)
Noted  

## 2023-03-06 NOTE — Telephone Encounter (Signed)
Incoming call received requesting verbal orders for skilled nursing once weekly for 8 weeks and physical therapy evaluation.   Per [employer] standing order, verbal order given. Message will be sent to patient's provider as a FYI.

## 2023-03-07 ENCOUNTER — Other Ambulatory Visit: Payer: Self-pay

## 2023-03-10 DIAGNOSIS — F411 Generalized anxiety disorder: Secondary | ICD-10-CM | POA: Diagnosis not present

## 2023-03-10 DIAGNOSIS — M25561 Pain in right knee: Secondary | ICD-10-CM | POA: Diagnosis not present

## 2023-03-10 DIAGNOSIS — D631 Anemia in chronic kidney disease: Secondary | ICD-10-CM | POA: Diagnosis not present

## 2023-03-10 DIAGNOSIS — Z87440 Personal history of urinary (tract) infections: Secondary | ICD-10-CM | POA: Diagnosis not present

## 2023-03-10 DIAGNOSIS — E785 Hyperlipidemia, unspecified: Secondary | ICD-10-CM | POA: Diagnosis not present

## 2023-03-10 DIAGNOSIS — I132 Hypertensive heart and chronic kidney disease with heart failure and with stage 5 chronic kidney disease, or end stage renal disease: Secondary | ICD-10-CM | POA: Diagnosis not present

## 2023-03-10 DIAGNOSIS — Z79899 Other long term (current) drug therapy: Secondary | ICD-10-CM | POA: Diagnosis not present

## 2023-03-10 DIAGNOSIS — Z993 Dependence on wheelchair: Secondary | ICD-10-CM | POA: Diagnosis not present

## 2023-03-10 DIAGNOSIS — I5032 Chronic diastolic (congestive) heart failure: Secondary | ICD-10-CM | POA: Diagnosis not present

## 2023-03-10 DIAGNOSIS — Z466 Encounter for fitting and adjustment of urinary device: Secondary | ICD-10-CM | POA: Diagnosis not present

## 2023-03-10 DIAGNOSIS — M1990 Unspecified osteoarthritis, unspecified site: Secondary | ICD-10-CM | POA: Diagnosis not present

## 2023-03-10 DIAGNOSIS — N186 End stage renal disease: Secondary | ICD-10-CM | POA: Diagnosis not present

## 2023-03-11 ENCOUNTER — Telehealth: Payer: Self-pay

## 2023-03-11 ENCOUNTER — Encounter (HOSPITAL_COMMUNITY): Payer: Medicare (Managed Care)

## 2023-03-11 ENCOUNTER — Ambulatory Visit: Payer: Self-pay

## 2023-03-11 DIAGNOSIS — R5381 Other malaise: Secondary | ICD-10-CM

## 2023-03-11 DIAGNOSIS — R2681 Unsteadiness on feet: Secondary | ICD-10-CM

## 2023-03-11 NOTE — Telephone Encounter (Signed)
Hospital bed mattress ordered.please fax to [facility] as requested.

## 2023-03-11 NOTE — Telephone Encounter (Signed)
Fax completed.

## 2023-03-11 NOTE — Telephone Encounter (Signed)
Judie Grieve from [facility] called and states that he needs verbal orders for patient to have PT once weekly for one week, Twice weekly for 4 weeks, and then once weekly again for one week. Verbal orders given. He also request that DME be sent for patient to have new hospital mattress. He states that patient current mattress has dent and he's worried she will develop pressure sore. Message routed to PCP [name], [name], NP

## 2023-03-11 NOTE — Patient Outreach (Signed)
  Care Coordination   03/11/2023 Name: Sabrine Kitzman MRN: [id number] DOB: 1951/05/19   Care Coordination Outreach Attempts:  An unsuccessful telephone outreach was attempted for a scheduled appointment today.  Follow Up Plan:  Additional outreach attempts will be made to offer the patient care coordination information and services.   Encounter Outcome:  No Answer   Care Coordination Interventions:  No, not indicated    Delsa Sale, RN, BSN, CCM Care Management Coordinator [facility] Care Management Direct Phone: [phone]

## 2023-03-14 DIAGNOSIS — I5032 Chronic diastolic (congestive) heart failure: Secondary | ICD-10-CM | POA: Diagnosis not present

## 2023-03-14 DIAGNOSIS — F411 Generalized anxiety disorder: Secondary | ICD-10-CM | POA: Diagnosis not present

## 2023-03-14 DIAGNOSIS — N186 End stage renal disease: Secondary | ICD-10-CM | POA: Diagnosis not present

## 2023-03-14 DIAGNOSIS — Z87440 Personal history of urinary (tract) infections: Secondary | ICD-10-CM | POA: Diagnosis not present

## 2023-03-14 DIAGNOSIS — M1990 Unspecified osteoarthritis, unspecified site: Secondary | ICD-10-CM | POA: Diagnosis not present

## 2023-03-14 DIAGNOSIS — Z79899 Other long term (current) drug therapy: Secondary | ICD-10-CM | POA: Diagnosis not present

## 2023-03-14 DIAGNOSIS — M25561 Pain in right knee: Secondary | ICD-10-CM | POA: Diagnosis not present

## 2023-03-14 DIAGNOSIS — E785 Hyperlipidemia, unspecified: Secondary | ICD-10-CM | POA: Diagnosis not present

## 2023-03-14 DIAGNOSIS — Z466 Encounter for fitting and adjustment of urinary device: Secondary | ICD-10-CM | POA: Diagnosis not present

## 2023-03-14 DIAGNOSIS — D631 Anemia in chronic kidney disease: Secondary | ICD-10-CM | POA: Diagnosis not present

## 2023-03-14 DIAGNOSIS — I132 Hypertensive heart and chronic kidney disease with heart failure and with stage 5 chronic kidney disease, or end stage renal disease: Secondary | ICD-10-CM | POA: Diagnosis not present

## 2023-03-14 DIAGNOSIS — Z993 Dependence on wheelchair: Secondary | ICD-10-CM | POA: Diagnosis not present

## 2023-03-18 DIAGNOSIS — Z87440 Personal history of urinary (tract) infections: Secondary | ICD-10-CM | POA: Diagnosis not present

## 2023-03-18 DIAGNOSIS — D631 Anemia in chronic kidney disease: Secondary | ICD-10-CM | POA: Diagnosis not present

## 2023-03-18 DIAGNOSIS — E785 Hyperlipidemia, unspecified: Secondary | ICD-10-CM | POA: Diagnosis not present

## 2023-03-18 DIAGNOSIS — M1990 Unspecified osteoarthritis, unspecified site: Secondary | ICD-10-CM | POA: Diagnosis not present

## 2023-03-18 DIAGNOSIS — M25561 Pain in right knee: Secondary | ICD-10-CM | POA: Diagnosis not present

## 2023-03-18 DIAGNOSIS — Z993 Dependence on wheelchair: Secondary | ICD-10-CM | POA: Diagnosis not present

## 2023-03-18 DIAGNOSIS — F411 Generalized anxiety disorder: Secondary | ICD-10-CM | POA: Diagnosis not present

## 2023-03-18 DIAGNOSIS — I132 Hypertensive heart and chronic kidney disease with heart failure and with stage 5 chronic kidney disease, or end stage renal disease: Secondary | ICD-10-CM | POA: Diagnosis not present

## 2023-03-18 DIAGNOSIS — Z466 Encounter for fitting and adjustment of urinary device: Secondary | ICD-10-CM | POA: Diagnosis not present

## 2023-03-18 DIAGNOSIS — N186 End stage renal disease: Secondary | ICD-10-CM | POA: Diagnosis not present

## 2023-03-18 DIAGNOSIS — Z79899 Other long term (current) drug therapy: Secondary | ICD-10-CM | POA: Diagnosis not present

## 2023-03-18 DIAGNOSIS — I5032 Chronic diastolic (congestive) heart failure: Secondary | ICD-10-CM | POA: Diagnosis not present

## 2023-03-19 DIAGNOSIS — M1990 Unspecified osteoarthritis, unspecified site: Secondary | ICD-10-CM | POA: Diagnosis not present

## 2023-03-19 DIAGNOSIS — N185 Chronic kidney disease, stage 5: Secondary | ICD-10-CM | POA: Diagnosis not present

## 2023-03-19 DIAGNOSIS — I5032 Chronic diastolic (congestive) heart failure: Secondary | ICD-10-CM | POA: Diagnosis not present

## 2023-03-19 DIAGNOSIS — M25561 Pain in right knee: Secondary | ICD-10-CM | POA: Diagnosis not present

## 2023-03-19 DIAGNOSIS — F411 Generalized anxiety disorder: Secondary | ICD-10-CM | POA: Diagnosis not present

## 2023-03-19 DIAGNOSIS — Z993 Dependence on wheelchair: Secondary | ICD-10-CM | POA: Diagnosis not present

## 2023-03-19 DIAGNOSIS — Z466 Encounter for fitting and adjustment of urinary device: Secondary | ICD-10-CM | POA: Diagnosis not present

## 2023-03-19 DIAGNOSIS — Z87440 Personal history of urinary (tract) infections: Secondary | ICD-10-CM | POA: Diagnosis not present

## 2023-03-19 DIAGNOSIS — E785 Hyperlipidemia, unspecified: Secondary | ICD-10-CM | POA: Diagnosis not present

## 2023-03-19 DIAGNOSIS — I132 Hypertensive heart and chronic kidney disease with heart failure and with stage 5 chronic kidney disease, or end stage renal disease: Secondary | ICD-10-CM | POA: Diagnosis not present

## 2023-03-19 DIAGNOSIS — Z79899 Other long term (current) drug therapy: Secondary | ICD-10-CM | POA: Diagnosis not present

## 2023-03-19 DIAGNOSIS — N186 End stage renal disease: Secondary | ICD-10-CM | POA: Diagnosis not present

## 2023-03-19 DIAGNOSIS — D631 Anemia in chronic kidney disease: Secondary | ICD-10-CM | POA: Diagnosis not present

## 2023-03-20 ENCOUNTER — Ambulatory Visit: Payer: Medicare (Managed Care) | Admitting: Nurse Practitioner

## 2023-03-21 ENCOUNTER — Telehealth: Payer: Self-pay

## 2023-03-21 DIAGNOSIS — I10 Essential (primary) hypertension: Secondary | ICD-10-CM | POA: Diagnosis not present

## 2023-03-21 DIAGNOSIS — I5032 Chronic diastolic (congestive) heart failure: Secondary | ICD-10-CM | POA: Diagnosis not present

## 2023-03-21 DIAGNOSIS — Z466 Encounter for fitting and adjustment of urinary device: Secondary | ICD-10-CM | POA: Diagnosis not present

## 2023-03-21 DIAGNOSIS — M1990 Unspecified osteoarthritis, unspecified site: Secondary | ICD-10-CM | POA: Diagnosis not present

## 2023-03-21 DIAGNOSIS — D631 Anemia in chronic kidney disease: Secondary | ICD-10-CM | POA: Diagnosis not present

## 2023-03-21 DIAGNOSIS — E785 Hyperlipidemia, unspecified: Secondary | ICD-10-CM | POA: Diagnosis not present

## 2023-03-21 DIAGNOSIS — M25561 Pain in right knee: Secondary | ICD-10-CM | POA: Diagnosis not present

## 2023-03-21 DIAGNOSIS — Z79899 Other long term (current) drug therapy: Secondary | ICD-10-CM | POA: Diagnosis not present

## 2023-03-21 DIAGNOSIS — Z87440 Personal history of urinary (tract) infections: Secondary | ICD-10-CM | POA: Diagnosis not present

## 2023-03-21 DIAGNOSIS — I132 Hypertensive heart and chronic kidney disease with heart failure and with stage 5 chronic kidney disease, or end stage renal disease: Secondary | ICD-10-CM | POA: Diagnosis not present

## 2023-03-21 DIAGNOSIS — N186 End stage renal disease: Secondary | ICD-10-CM | POA: Diagnosis not present

## 2023-03-21 DIAGNOSIS — R0689 Other abnormalities of breathing: Secondary | ICD-10-CM | POA: Diagnosis not present

## 2023-03-21 DIAGNOSIS — F411 Generalized anxiety disorder: Secondary | ICD-10-CM | POA: Diagnosis not present

## 2023-03-21 DIAGNOSIS — Z993 Dependence on wheelchair: Secondary | ICD-10-CM | POA: Diagnosis not present

## 2023-03-21 NOTE — Telephone Encounter (Signed)
Korey Development worker, international aid with [facility] called and left message on clinical intake voicemail stating that [facility] informed them of a crtical calcium level of 6.9. Please advise.  Message sent to [name], NP

## 2023-03-21 NOTE — Telephone Encounter (Signed)
Recommend rechecking Stat Vitamin D level ,PTH and BMP

## 2023-03-21 NOTE — Telephone Encounter (Signed)
Tried calling Korey back and left detailed message for her and to call back to let me know she received the message.

## 2023-03-22 ENCOUNTER — Other Ambulatory Visit: Payer: Self-pay

## 2023-03-22 ENCOUNTER — Inpatient Hospital Stay (HOSPITAL_COMMUNITY)
Admission: EM | Admit: 2023-03-22 | Discharge: 2023-04-01 | DRG: 264 | Disposition: A | Discharge status: Home Health | Payer: Medicare (Managed Care) | Attending: Student | Admitting: Student

## 2023-03-22 ENCOUNTER — Emergency Department (HOSPITAL_COMMUNITY): Payer: Medicare (Managed Care)

## 2023-03-22 ENCOUNTER — Encounter (HOSPITAL_COMMUNITY): Payer: Self-pay | Admitting: Emergency Medicine

## 2023-03-22 DIAGNOSIS — D72819 Decreased white blood cell count, unspecified: Secondary | ICD-10-CM | POA: Diagnosis present

## 2023-03-22 DIAGNOSIS — R0602 Shortness of breath: Secondary | ICD-10-CM | POA: Diagnosis not present

## 2023-03-22 DIAGNOSIS — R0603 Acute respiratory distress: Secondary | ICD-10-CM | POA: Diagnosis present

## 2023-03-22 DIAGNOSIS — L89151 Pressure ulcer of sacral region, stage 1: Secondary | ICD-10-CM | POA: Diagnosis present

## 2023-03-22 DIAGNOSIS — D638 Anemia in other chronic diseases classified elsewhere: Secondary | ICD-10-CM | POA: Diagnosis present

## 2023-03-22 DIAGNOSIS — N1832 Chronic kidney disease, stage 3b: Secondary | ICD-10-CM | POA: Diagnosis not present

## 2023-03-22 DIAGNOSIS — Z515 Encounter for palliative care: Secondary | ICD-10-CM

## 2023-03-22 DIAGNOSIS — Z9842 Cataract extraction status, left eye: Secondary | ICD-10-CM

## 2023-03-22 DIAGNOSIS — Z79899 Other long term (current) drug therapy: Secondary | ICD-10-CM | POA: Diagnosis not present

## 2023-03-22 DIAGNOSIS — I130 Hypertensive heart and chronic kidney disease with heart failure and stage 1 through stage 4 chronic kidney disease, or unspecified chronic kidney disease: Secondary | ICD-10-CM | POA: Diagnosis not present

## 2023-03-22 DIAGNOSIS — F411 Generalized anxiety disorder: Secondary | ICD-10-CM | POA: Diagnosis present

## 2023-03-22 DIAGNOSIS — N186 End stage renal disease: Secondary | ICD-10-CM | POA: Diagnosis present

## 2023-03-22 DIAGNOSIS — R5381 Other malaise: Secondary | ICD-10-CM | POA: Diagnosis not present

## 2023-03-22 DIAGNOSIS — A419 Sepsis, unspecified organism: Secondary | ICD-10-CM | POA: Diagnosis not present

## 2023-03-22 DIAGNOSIS — R1310 Dysphagia, unspecified: Secondary | ICD-10-CM | POA: Diagnosis not present

## 2023-03-22 DIAGNOSIS — Z87891 Personal history of nicotine dependence: Secondary | ICD-10-CM

## 2023-03-22 DIAGNOSIS — I5082 Biventricular heart failure: Secondary | ICD-10-CM | POA: Diagnosis not present

## 2023-03-22 DIAGNOSIS — I493 Ventricular premature depolarization: Secondary | ICD-10-CM | POA: Diagnosis present

## 2023-03-22 DIAGNOSIS — E785 Hyperlipidemia, unspecified: Secondary | ICD-10-CM | POA: Diagnosis not present

## 2023-03-22 DIAGNOSIS — I10 Essential (primary) hypertension: Secondary | ICD-10-CM | POA: Diagnosis not present

## 2023-03-22 DIAGNOSIS — Y92239 Unspecified place in hospital as the place of occurrence of the external cause: Secondary | ICD-10-CM | POA: Diagnosis present

## 2023-03-22 DIAGNOSIS — I890 Lymphedema, not elsewhere classified: Secondary | ICD-10-CM | POA: Diagnosis present

## 2023-03-22 DIAGNOSIS — R531 Weakness: Secondary | ICD-10-CM | POA: Diagnosis not present

## 2023-03-22 DIAGNOSIS — L89312 Pressure ulcer of right buttock, stage 2: Secondary | ICD-10-CM | POA: Diagnosis not present

## 2023-03-22 DIAGNOSIS — D61818 Other pancytopenia: Secondary | ICD-10-CM | POA: Diagnosis not present

## 2023-03-22 DIAGNOSIS — E669 Obesity, unspecified: Secondary | ICD-10-CM | POA: Diagnosis not present

## 2023-03-22 DIAGNOSIS — I5033 Acute on chronic diastolic (congestive) heart failure: Secondary | ICD-10-CM

## 2023-03-22 DIAGNOSIS — J9811 Atelectasis: Secondary | ICD-10-CM | POA: Diagnosis not present

## 2023-03-22 DIAGNOSIS — N185 Chronic kidney disease, stage 5: Secondary | ICD-10-CM | POA: Diagnosis not present

## 2023-03-22 DIAGNOSIS — E8809 Other disorders of plasma-protein metabolism, not elsewhere classified: Secondary | ICD-10-CM | POA: Diagnosis present

## 2023-03-22 DIAGNOSIS — Z7189 Other specified counseling: Secondary | ICD-10-CM | POA: Diagnosis not present

## 2023-03-22 DIAGNOSIS — S31000A Unspecified open wound of lower back and pelvis without penetration into retroperitoneum, initial encounter: Secondary | ICD-10-CM | POA: Diagnosis present

## 2023-03-22 DIAGNOSIS — E039 Hypothyroidism, unspecified: Secondary | ICD-10-CM | POA: Diagnosis not present

## 2023-03-22 DIAGNOSIS — I5043 Acute on chronic combined systolic (congestive) and diastolic (congestive) heart failure: Secondary | ICD-10-CM | POA: Diagnosis not present

## 2023-03-22 DIAGNOSIS — R9431 Abnormal electrocardiogram [ECG] [EKG]: Secondary | ICD-10-CM | POA: Diagnosis not present

## 2023-03-22 DIAGNOSIS — I480 Paroxysmal atrial fibrillation: Secondary | ICD-10-CM | POA: Diagnosis not present

## 2023-03-22 DIAGNOSIS — D631 Anemia in chronic kidney disease: Secondary | ICD-10-CM | POA: Diagnosis not present

## 2023-03-22 DIAGNOSIS — Z7401 Bed confinement status: Secondary | ICD-10-CM | POA: Diagnosis not present

## 2023-03-22 DIAGNOSIS — I5021 Acute systolic (congestive) heart failure: Secondary | ICD-10-CM | POA: Diagnosis not present

## 2023-03-22 DIAGNOSIS — I700 Atherosclerosis of aorta: Secondary | ICD-10-CM | POA: Diagnosis not present

## 2023-03-22 DIAGNOSIS — I132 Hypertensive heart and chronic kidney disease with heart failure and with stage 5 chronic kidney disease, or end stage renal disease: Secondary | ICD-10-CM | POA: Diagnosis not present

## 2023-03-22 DIAGNOSIS — T508X5A Adverse effect of diagnostic agents, initial encounter: Secondary | ICD-10-CM | POA: Diagnosis present

## 2023-03-22 DIAGNOSIS — I50811 Acute right heart failure: Secondary | ICD-10-CM

## 2023-03-22 DIAGNOSIS — I5041 Acute combined systolic (congestive) and diastolic (congestive) heart failure: Secondary | ICD-10-CM | POA: Diagnosis not present

## 2023-03-22 DIAGNOSIS — N189 Chronic kidney disease, unspecified: Secondary | ICD-10-CM | POA: Diagnosis not present

## 2023-03-22 DIAGNOSIS — Z66 Do not resuscitate: Secondary | ICD-10-CM | POA: Diagnosis not present

## 2023-03-22 DIAGNOSIS — M898X9 Other specified disorders of bone, unspecified site: Secondary | ICD-10-CM | POA: Diagnosis present

## 2023-03-22 DIAGNOSIS — Z888 Allergy status to other drugs, medicaments and biological substances status: Secondary | ICD-10-CM

## 2023-03-22 DIAGNOSIS — J811 Chronic pulmonary edema: Secondary | ICD-10-CM | POA: Diagnosis not present

## 2023-03-22 DIAGNOSIS — R001 Bradycardia, unspecified: Secondary | ICD-10-CM | POA: Diagnosis present

## 2023-03-22 DIAGNOSIS — E46 Unspecified protein-calorie malnutrition: Secondary | ICD-10-CM | POA: Diagnosis present

## 2023-03-22 DIAGNOSIS — N1411 Contrast-induced nephropathy: Secondary | ICD-10-CM | POA: Diagnosis present

## 2023-03-22 DIAGNOSIS — Z6831 Body mass index (BMI) 31.0-31.9, adult: Secondary | ICD-10-CM

## 2023-03-22 DIAGNOSIS — I083 Combined rheumatic disorders of mitral, aortic and tricuspid valves: Secondary | ICD-10-CM | POA: Diagnosis present

## 2023-03-22 DIAGNOSIS — R601 Generalized edema: Secondary | ICD-10-CM

## 2023-03-22 DIAGNOSIS — I509 Heart failure, unspecified: Secondary | ICD-10-CM | POA: Diagnosis not present

## 2023-03-22 DIAGNOSIS — Z9841 Cataract extraction status, right eye: Secondary | ICD-10-CM

## 2023-03-22 DIAGNOSIS — J90 Pleural effusion, not elsewhere classified: Secondary | ICD-10-CM | POA: Diagnosis not present

## 2023-03-22 DIAGNOSIS — I5042 Chronic combined systolic (congestive) and diastolic (congestive) heart failure: Secondary | ICD-10-CM | POA: Diagnosis not present

## 2023-03-22 DIAGNOSIS — E8779 Other fluid overload: Secondary | ICD-10-CM | POA: Diagnosis not present

## 2023-03-22 DIAGNOSIS — Z808 Family history of malignant neoplasm of other organs or systems: Secondary | ICD-10-CM

## 2023-03-22 DIAGNOSIS — U071 COVID-19: Secondary | ICD-10-CM | POA: Diagnosis not present

## 2023-03-22 DIAGNOSIS — I110 Hypertensive heart disease with heart failure: Secondary | ICD-10-CM | POA: Diagnosis not present

## 2023-03-22 LAB — BASIC METABOLIC PANEL
Anion gap: 11 (ref 5–15)
BUN: 78 mg/dL — ABNORMAL HIGH (ref 8–23)
CO2: 21 mmol/L — ABNORMAL LOW (ref 22–32)
Calcium: 7.1 mg/dL — ABNORMAL LOW (ref 8.9–10.3)
Chloride: 105 mmol/L (ref 98–111)
Creatinine, Ser: 3.88 mg/dL — ABNORMAL HIGH (ref 0.44–1.00)
GFR, Estimated: 12 mL/min — ABNORMAL LOW (ref >60)
Glucose, Bld: 80 mg/dL (ref 70–99)
Potassium: 4.1 mmol/L (ref 3.5–5.1)
Sodium: 137 mmol/L (ref 135–145)

## 2023-03-22 LAB — CBC WITH DIFFERENTIAL/PLATELET
Abs Immature Granulocytes: 0.01 10*3/uL (ref 0.00–0.07)
Basophils Absolute: 0 10*3/uL (ref 0.0–0.1)
Basophils Relative: 1 %
Eosinophils Absolute: 0.1 10*3/uL (ref 0.0–0.5)
Eosinophils Relative: 3 %
HCT: 36.3 % (ref 36.0–46.0)
Hemoglobin: 11.2 g/dL — ABNORMAL LOW (ref 12.0–15.0)
Immature Granulocytes: 0 %
Lymphocytes Relative: 30 %
Lymphs Abs: 0.9 10*3/uL (ref 0.7–4.0)
MCH: 27.9 pg (ref 26.0–34.0)
MCHC: 30.9 g/dL (ref 30.0–36.0)
MCV: 90.3 fL (ref 80.0–100.0)
Monocytes Absolute: 0.1 10*3/uL (ref 0.1–1.0)
Monocytes Relative: 4 %
Neutro Abs: 1.9 10*3/uL (ref 1.7–7.7)
Neutrophils Relative %: 62 %
Platelets: 171 10*3/uL (ref 150–400)
RBC: 4.02 MIL/uL (ref 3.87–5.11)
RDW: 16.7 % — ABNORMAL HIGH (ref 11.5–15.5)
WBC: 3.1 10*3/uL — ABNORMAL LOW (ref 4.0–10.5)
nRBC: 0 % (ref 0.0–0.2)

## 2023-03-22 LAB — PROCALCITONIN: Procalcitonin: 0.1 ng/mL

## 2023-03-22 LAB — TROPONIN I (HIGH SENSITIVITY)
Troponin I (High Sensitivity): 9 ng/L (ref <18)
Troponin I (High Sensitivity): 9 ng/L (ref <18)

## 2023-03-22 LAB — BRAIN NATRIURETIC PEPTIDE: B Natriuretic Peptide: 715.1 pg/mL — ABNORMAL HIGH (ref 0.0–100.0)

## 2023-03-22 MED ORDER — FOLIC ACID 1 MG PO TABS
1.0000 mg | ORAL_TABLET | Freq: Every day | ORAL | Status: DC
  Administered 2023-03-22 – 2023-04-01 (×10): 1 mg via ORAL
  Filled 2023-03-22 (×12): qty 1

## 2023-03-22 MED ORDER — CARVEDILOL 3.125 MG PO TABS
3.1250 mg | ORAL_TABLET | Freq: Two times a day (BID) | ORAL | Status: DC
  Administered 2023-03-22 – 2023-03-23 (×2): 3.125 mg via ORAL
  Filled 2023-03-22 (×2): qty 1

## 2023-03-22 MED ORDER — SODIUM BICARBONATE 650 MG PO TABS
650.0000 mg | ORAL_TABLET | Freq: Two times a day (BID) | ORAL | Status: DC
  Administered 2023-03-22 – 2023-03-25 (×8): 650 mg via ORAL
  Filled 2023-03-22 (×9): qty 1

## 2023-03-22 MED ORDER — ACETAMINOPHEN 650 MG RE SUPP: 650.0000 mg | Freq: Four times a day (QID) | RECTAL | Status: DC | PRN

## 2023-03-22 MED ORDER — HYDRALAZINE HCL 25 MG PO TABS
25.0000 mg | ORAL_TABLET | Freq: Two times a day (BID) | ORAL | Status: DC
  Administered 2023-03-22 – 2023-03-24 (×4): 25 mg via ORAL
  Filled 2023-03-22 (×4): qty 1

## 2023-03-22 MED ORDER — FUROSEMIDE 10 MG/ML IJ SOLN
40.0000 mg | Freq: Once | INTRAMUSCULAR | Status: AC
  Administered 2023-03-22: 40 mg via INTRAVENOUS
  Filled 2023-03-22: qty 4

## 2023-03-22 MED ORDER — HEPARIN SODIUM (PORCINE) 5000 UNIT/ML IJ SOLN
5000.0000 [IU] | Freq: Three times a day (TID) | INTRAMUSCULAR | Status: DC
  Administered 2023-03-22 – 2023-03-31 (×28): 5000 [IU] via SUBCUTANEOUS
  Filled 2023-03-22 (×31): qty 1

## 2023-03-22 MED ORDER — FERROUS SULFATE 325 (65 FE) MG PO TABS
325.0000 mg | ORAL_TABLET | Freq: Two times a day (BID) | ORAL | Status: DC
  Administered 2023-03-22 – 2023-03-25 (×7): 325 mg via ORAL
  Filled 2023-03-22 (×8): qty 1

## 2023-03-22 MED ORDER — FUROSEMIDE 10 MG/ML IJ SOLN
80.0000 mg | Freq: Three times a day (TID) | INTRAMUSCULAR | Status: DC
  Administered 2023-03-22 – 2023-03-27 (×17): 80 mg via INTRAVENOUS
  Filled 2023-03-22 (×17): qty 8

## 2023-03-22 MED ORDER — SEVELAMER CARBONATE 800 MG PO TABS
800.0000 mg | ORAL_TABLET | Freq: Every day | ORAL | Status: DC
  Administered 2023-03-22 – 2023-03-25 (×4): 800 mg via ORAL
  Filled 2023-03-22 (×4): qty 1

## 2023-03-22 MED ORDER — ACETAMINOPHEN 325 MG PO TABS
650.0000 mg | ORAL_TABLET | Freq: Four times a day (QID) | ORAL | Status: DC | PRN
  Administered 2023-03-26 – 2023-03-30 (×3): 650 mg via ORAL
  Filled 2023-03-22 (×7): qty 2

## 2023-03-22 MED ORDER — HYDRALAZINE HCL 20 MG/ML IJ SOLN
10.0000 mg | INTRAMUSCULAR | Status: DC | PRN
  Administered 2023-03-26 – 2023-03-30 (×3): 10 mg via INTRAVENOUS
  Filled 2023-03-22 (×6): qty 1

## 2023-03-22 MED ORDER — CHLORHEXIDINE GLUCONATE CLOTH 2 % EX PADS
6.0000 | MEDICATED_PAD | Freq: Every day | CUTANEOUS | Status: DC
  Administered 2023-03-22 – 2023-03-31 (×10): 6 via TOPICAL

## 2023-03-22 MED ORDER — MUSCLE RUB 10-15 % EX CREA: 1.0000 | TOPICAL_CREAM | Freq: Every day | CUTANEOUS | Status: DC | PRN

## 2023-03-22 MED ORDER — SODIUM CHLORIDE 0.9% FLUSH
3.0000 mL | Freq: Two times a day (BID) | INTRAVENOUS | Status: DC
  Administered 2023-03-22 – 2023-03-31 (×20): 3 mL via INTRAVENOUS

## 2023-03-22 MED ORDER — ALBUTEROL SULFATE (2.5 MG/3ML) 0.083% IN NEBU: 2.5000 mg | INHALATION_SOLUTION | Freq: Four times a day (QID) | RESPIRATORY_TRACT | Status: DC | PRN

## 2023-03-22 MED ORDER — FUROSEMIDE 10 MG/ML IJ SOLN
80.0000 mg | Freq: Two times a day (BID) | INTRAMUSCULAR | Status: DC
  Administered 2023-03-22: 80 mg via INTRAVENOUS
  Filled 2023-03-22: qty 8

## 2023-03-22 NOTE — ED Notes (Signed)
ED TO INPATIENT HANDOFF REPORT  ED Nurse Name and Phone #: Delmer Islam RN  [phone]  S Name/Age/Gender Kara Mullins 72 y.o. female Room/Bed: 002C/002C  Code Status   Code Status: Full Code  Home/SNF/Other Home Patient oriented to: self, place, time, and situation Is this baseline? Yes   Triage Complete: Triage complete  Chief Complaint Acute on chronic diastolic (congestive) heart failure (HCC) [I50.33]  Triage Note Patient BIB [facility] c/o increasing shob today.  Patient reports dealing with excessive swelling for a month and feels like her lasix needs to be increased.  Patient has edema throughout.    Allergies Allergies  Allergen Reactions   Crestor [Rosuvastatin] Other (See Comments)    "Felt like she couldn't swallow"   Nitrofuran Derivatives Other (See Comments)    Unknown reaction    Level of Care/Admitting Diagnosis ED Disposition     ED Disposition  Admit   Condition  --   Comment  Hospital Area: [facility] [[id number]]  Level of Care: Telemetry Cardiac [[id number]]  May admit patient to [facility] or [facility] if equivalent level of care is available:: No  Covid Evaluation: Asymptomatic - no recent exposure (last 10 days) testing not required  Diagnosis: Acute on chronic diastolic (congestive) heart failure [facility]) [[id number]]  Admitting Physician: Clydie Braun [[id number]]  Attending Physician: Clydie Braun [[id number]]  Certification:: I certify this patient will need inpatient services for at least 2 midnights  Estimated Length of Stay: 2          B Medical/Surgery History Past Medical History:  Diagnosis Date   Chronic combined systolic and diastolic congestive heart failure (HCC) 11/09/2021   Generalized anxiety disorder    Hyperlipidemia 11/09/2021   Hypertension    Stage 3b chronic kidney disease (CKD) (HCC) 11/09/2021   Past Surgical History:  Procedure Laterality Date   CATARACT EXTRACTION, BILATERAL     combined systolic  and diastolic congestive heart failure        A IV Location/Drains/Wounds Patient Lines/Drains/Airways Status     Active Line/Drains/Airways     Name Placement date Placement time Site Days   Peripheral IV 03/22/23 20 G Right Antecubital 03/22/23  0045  Antecubital  less than 1   Urethral Catheter Leonette Monarch Non-latex 16 Fr. 02/27/23  1556  Non-latex  23   Pressure Injury 03/28/22 Sacrum Mid Stage 1 -  Intact skin with non-blanchable redness of a localized area usually over a bony prominence. Area is darker and non-blanchable. Intact, clean, dry. 03/28/22  0126  -- 359   Wound / Incision (Open or Dehisced) 03/13/21 Puncture Sacrum CT bone marrow biopsy 03/13/21  1543  Sacrum  739   Wound / Incision (Open or Dehisced) 11/12/21 (MASD) Moisture Associated Skin Damage Sacrum Left small area of moist, broken skin appearing moisture related 11/12/21  0900  Sacrum  495            Intake/Output Last 24 hours No intake or output data in the 24 hours ending 03/22/23 1312  Labs/Imaging Results for orders placed or performed during the hospital encounter of 03/22/23 (from the past 48 hour(s))  CBC with Differential     Status: Abnormal   Collection Time: 03/22/23 12:50 AM  Result Value Ref Range   WBC 3.1 (L) 4.0 - 10.5 K/uL   RBC 4.02 3.87 - 5.11 MIL/uL   Hemoglobin 11.2 (L) 12.0 - 15.0 g/dL   HCT 86.5 78.4 - 69.6 %   MCV 90.3 80.0 -  100.0 fL   MCH 27.9 26.0 - 34.0 pg   MCHC 30.9 30.0 - 36.0 g/dL   RDW 16.1 (H) 09.6 - 04.5 %   Platelets 171 150 - 400 K/uL   nRBC 0.0 0.0 - 0.2 %   Neutrophils Relative % 62 %   Neutro Abs 1.9 1.7 - 7.7 K/uL   Lymphocytes Relative 30 %   Lymphs Abs 0.9 0.7 - 4.0 K/uL   Monocytes Relative 4 %   Monocytes Absolute 0.1 0.1 - 1.0 K/uL   Eosinophils Relative 3 %   Eosinophils Absolute 0.1 0.0 - 0.5 K/uL   Basophils Relative 1 %   Basophils Absolute 0.0 0.0 - 0.1 K/uL   Immature Granulocytes 0 %   Abs Immature Granulocytes 0.01 0.00 - 0.07 K/uL     Comment: Performed at [facility], [address], [place], [place] [address]  Basic metabolic panel     Status: Abnormal   Collection Time: 03/22/23 12:50 AM  Result Value Ref Range   Sodium 137 135 - 145 mmol/L   Potassium 4.1 3.5 - 5.1 mmol/L   Chloride 105 98 - 111 mmol/L   CO2 21 (L) 22 - 32 mmol/L   Glucose, Bld 80 70 - 99 mg/dL    Comment: Glucose reference range applies only to samples taken after fasting for at least 8 hours.   BUN 78 (H) 8 - 23 mg/dL   Creatinine, Ser 1.91 (H) 0.44 - 1.00 mg/dL   Calcium 7.1 (L) 8.9 - 10.3 mg/dL   GFR, Estimated 12 (L) >60 mL/min    Comment: (NOTE) Calculated using the CKD-EPI Creatinine Equation (2021)    Anion gap 11 5 - 15    Comment: Performed at [facility], [address], [place], [place] [address]  Brain natriuretic peptide     Status: Abnormal   Collection Time: 03/22/23 12:50 AM  Result Value Ref Range   B Natriuretic Peptide 715.1 (H) 0.0 - 100.0 pg/mL    Comment: Performed at [facility], [address], [place], [place] [address]  Troponin I (High Sensitivity)     Status: None   Collection Time: 03/22/23 12:50 AM  Result Value Ref Range   Troponin I (High Sensitivity) 9 <18 ng/L    Comment: (NOTE) Elevated high sensitivity troponin I (hsTnI) values and significant  changes across serial measurements may suggest ACS but many other  chronic and acute conditions are known to elevate hsTnI results.  Refer to the "Links" section for chest pain algorithms and additional  guidance. Performed at [facility], [address], [place], [place] [address]   Troponin I (High Sensitivity)     Status: None   Collection Time: 03/22/23  2:48 AM  Result Value Ref Range   Troponin I (High Sensitivity) 9 <18 ng/L    Comment: (NOTE) Elevated high sensitivity troponin I (hsTnI) values and significant  changes across serial measurements may suggest ACS but many other  chronic and acute conditions are known to  elevate hsTnI results.  Refer to the "Links" section for chest pain algorithms and additional  guidance. Performed at [facility], [address], [place], [place] [address]    DG Chest Portable 1 View  Result Date: 03/22/2023 CLINICAL DATA:  Shortness of breath. EXAM: PORTABLE CHEST 1 VIEW COMPARISON:  Feb 25, 2023 FINDINGS: The cardiac silhouette is mildly enlarged and unchanged in size. There is moderate severity calcification of the aortic  arch. Moderate to marked severity airspace disease is seen within the mid to lower lung fields, bilaterally. Bilateral small to moderate sized pleural effusions are noted. No pneumothorax is identified. The visualized skeletal structures are unremarkable. IMPRESSION: 1. Stable moderate to marked severity bilateral airspace disease with stable small to moderate sized bilateral pleural effusions. Electronically Signed   By: Aram Candela M.D.   On: 03/22/2023 01:39    Pending Labs Unresulted Labs (From admission, onward)     Start     Ordered   03/23/23 0500  CBC  Tomorrow morning,   R        03/22/23 0835   03/23/23 0500  Basic metabolic panel  Daily,   R      03/22/23 0835   03/22/23 0839  Procalcitonin  Once,   R       References:    Procalcitonin Lower Respiratory Tract Infection AND Sepsis Procalcitonin Algorithm   03/22/23 0838            Vitals/Pain Today's Vitals   03/22/23 0630 03/22/23 0738 03/22/23 1000 03/22/23 1046  BP:  (!) 165/90 133/71   Pulse: (!) 57 (!) 56 (!) 59   Resp: 18 20 15    Temp:   98.3 F (36.8 C)   TempSrc:   Oral   SpO2: 100% 100% 100%   Weight:      Height:      PainSc:  0-No pain  0-No pain    Isolation Precautions No active isolations  Medications Medications  sodium bicarbonate tablet 650 mg (650 mg Oral Given 03/22/23 1037)  heparin injection 5,000 Units (5,000 Units Subcutaneous Given 03/22/23 0852)  sodium chloride flush (NS) 0.9 % injection 3 mL (3 mLs Intravenous Given 03/22/23 0959)   acetaminophen (TYLENOL) tablet 650 mg (has no administration in time range)    Or  acetaminophen (TYLENOL) suppository 650 mg (has no administration in time range)  albuterol (PROVENTIL) (2.5 MG/3ML) 0.083% nebulizer solution 2.5 mg (has no administration in time range)  hydrALAZINE (APRESOLINE) injection 10 mg (has no administration in time range)  furosemide (LASIX) injection 80 mg (has no administration in time range)  furosemide (LASIX) injection 40 mg (40 mg Intravenous Given 03/22/23 0151)    Mobility non-ambulatory     Focused Assessments Pulmonary Assessment Handoff:  Lung sounds: Bilateral Breath Sounds: Diminished L Breath Sounds: Diminished R Breath Sounds: Diminished O2 Device: Nasal Cannula O2 Flow Rate (L/min): 2 L/min    R Recommendations: See Admitting Provider Note  Report given to:   Additional Notes:  Patient is normally bedridden , has a chronic foley #16 a/o

## 2023-03-22 NOTE — ED Notes (Signed)
ED TO INPATIENT HANDOFF REPORT  ED Nurse Name and Phone #:  Eula Listen [phone]  S Name/Age/Gender Kara Mullins 72 y.o. female Room/Bed: 002C/002C  Code Status   Code Status: Prior  Home/SNF/Other Home Patient oriented to: self, place, time, and situation Is this baseline? Yes   Triage Complete: Triage complete  Chief Complaint shob  Triage Note Patient BIB [facility] c/o increasing shob today.  Patient reports dealing with excessive swelling for a month and feels like her lasix needs to be increased.  Patient has edema throughout.    Allergies Allergies  Allergen Reactions   Crestor [Rosuvastatin] Other (See Comments)    "Felt like she couldn't swallow"   Nitrofuran Derivatives     Level of Care/Admitting Diagnosis ED Disposition     ED Disposition  Admit   Condition  --   Comment  The patient appears reasonably stabilized for admission considering the current resources, flow, and capabilities available in the ED at this time, and I doubt any other [facility] requiring further screening and/or treatment in the ED prior to admission is  present.          B Medical/Surgery History Past Medical History:  Diagnosis Date   Chronic combined systolic and diastolic congestive heart failure (HCC) 11/09/2021   Generalized anxiety disorder    Hyperlipidemia 11/09/2021   Hypertension    Stage 3b chronic kidney disease (CKD) (HCC) 11/09/2021   Past Surgical History:  Procedure Laterality Date   CATARACT EXTRACTION, BILATERAL     combined systolic and diastolic congestive heart failure        A IV Location/Drains/Wounds Patient Lines/Drains/Airways Status     Active Line/Drains/Airways     Name Placement date Placement time Site Days   Peripheral IV 03/22/23 20 G Right Antecubital 03/22/23  0045  Antecubital  less than 1   Urethral Catheter Leonette Monarch Non-latex 16 Fr. 02/27/23  1556  Non-latex  23   Pressure Injury 03/28/22 Sacrum Mid Stage 1 -  Intact skin  with non-blanchable redness of a localized area usually over a bony prominence. Area is darker and non-blanchable. Intact, clean, dry. 03/28/22  0126  -- 359   Wound / Incision (Open or Dehisced) 03/13/21 Puncture Sacrum CT bone marrow biopsy 03/13/21  1543  Sacrum  739   Wound / Incision (Open or Dehisced) 11/12/21 (MASD) Moisture Associated Skin Damage Sacrum Left small area of moist, broken skin appearing moisture related 11/12/21  0900  Sacrum  495            Intake/Output Last 24 hours No intake or output data in the 24 hours ending 03/22/23 0610  Labs/Imaging Results for orders placed or performed during the hospital encounter of 03/22/23 (from the past 48 hour(s))  CBC with Differential     Status: Abnormal   Collection Time: 03/22/23 12:50 AM  Result Value Ref Range   WBC 3.1 (L) 4.0 - 10.5 K/uL   RBC 4.02 3.87 - 5.11 MIL/uL   Hemoglobin 11.2 (L) 12.0 - 15.0 g/dL   HCT 45.4 09.8 - 11.9 %   MCV 90.3 80.0 - 100.0 fL   MCH 27.9 26.0 - 34.0 pg   MCHC 30.9 30.0 - 36.0 g/dL   RDW 14.7 (H) 82.9 - 56.2 %   Platelets 171 150 - 400 K/uL   nRBC 0.0 0.0 - 0.2 %   Neutrophils Relative % 62 %   Neutro Abs 1.9 1.7 - 7.7 K/uL   Lymphocytes Relative 30 %  Lymphs Abs 0.9 0.7 - 4.0 K/uL   Monocytes Relative 4 %   Monocytes Absolute 0.1 0.1 - 1.0 K/uL   Eosinophils Relative 3 %   Eosinophils Absolute 0.1 0.0 - 0.5 K/uL   Basophils Relative 1 %   Basophils Absolute 0.0 0.0 - 0.1 K/uL   Immature Granulocytes 0 %   Abs Immature Granulocytes 0.01 0.00 - 0.07 K/uL    Comment: Performed at [facility], [address], [place], [place] [address]  Basic metabolic panel     Status: Abnormal   Collection Time: 03/22/23 12:50 AM  Result Value Ref Range   Sodium 137 135 - 145 mmol/L   Potassium 4.1 3.5 - 5.1 mmol/L   Chloride 105 98 - 111 mmol/L   CO2 21 (L) 22 - 32 mmol/L   Glucose, Bld 80 70 - 99 mg/dL    Comment: Glucose reference range applies only to samples taken after fasting  for at least 8 hours.   BUN 78 (H) 8 - 23 mg/dL   Creatinine, Ser 6.04 (H) 0.44 - 1.00 mg/dL   Calcium 7.1 (L) 8.9 - 10.3 mg/dL   GFR, Estimated 12 (L) >60 mL/min    Comment: (NOTE) Calculated using the CKD-EPI Creatinine Equation (2021)    Anion gap 11 5 - 15    Comment: Performed at [facility], [address] [place], [place] [address]  Brain natriuretic peptide     Status: Abnormal   Collection Time: 03/22/23 12:50 AM  Result Value Ref Range   B Natriuretic Peptide 715.1 (H) 0.0 - 100.0 pg/mL    Comment: Performed at [facility], [address] [place], [place] [address]  Troponin I (High Sensitivity)     Status: None   Collection Time: 03/22/23 12:50 AM  Result Value Ref Range   Troponin I (High Sensitivity) 9 <18 ng/L    Comment: (NOTE) Elevated high sensitivity troponin I (hsTnI) values and significant  changes across serial measurements may suggest ACS but many other  chronic and acute conditions are known to elevate hsTnI results.  Refer to the "Links" section for chest pain algorithms and additional  guidance. Performed at [facility], [address] [place], [place] [address]   Troponin I (High Sensitivity)     Status: None   Collection Time: 03/22/23  2:48 AM  Result Value Ref Range   Troponin I (High Sensitivity) 9 <18 ng/L    Comment: (NOTE) Elevated high sensitivity troponin I (hsTnI) values and significant  changes across serial measurements may suggest ACS but many other  chronic and acute conditions are known to elevate hsTnI results.  Refer to the "Links" section for chest pain algorithms and additional  guidance. Performed at [facility], [address] [place], [place] [address]    DG Chest Portable 1 View  Result Date: 03/22/2023 CLINICAL DATA:  Shortness of breath. EXAM: PORTABLE CHEST 1 VIEW COMPARISON:  Feb 25, 2023 FINDINGS: The cardiac silhouette is mildly enlarged and unchanged in size. There is moderate severity  calcification of the aortic arch. Moderate to marked severity airspace disease is seen within the mid to lower lung fields, bilaterally. Bilateral small to moderate sized pleural effusions are noted. No pneumothorax is identified. The visualized skeletal structures are unremarkable. IMPRESSION: 1. Stable moderate to marked severity bilateral airspace disease with stable small to moderate sized bilateral pleural effusions. Electronically Signed   By: Aram Candela M.D.   On:  03/22/2023 01:39    Pending Labs Unresulted Labs (From admission, onward)    None       Vitals/Pain Today's Vitals   03/22/23 0445 03/22/23 0515 03/22/23 0530 03/22/23 0545  BP: (!) 148/78 (!) 156/73 (!) 155/77 (!) 164/79  Pulse: (!) 54 (!) 51 (!) 53 (!) 55  Resp: 10 (!) 9 10 14   Temp:    98 F (36.7 C)  TempSrc:    Oral  SpO2: 100% 100% 100% 100%  Weight:      Height:      PainSc:        Isolation Precautions No active isolations  Medications Medications  furosemide (LASIX) injection 40 mg (40 mg Intravenous Given 03/22/23 0151)    Mobility non-ambulatory     Focused Assessments Cardiac Assessment Handoff:  Cardiac Rhythm: Sinus bradycardia No results found for: "CKTOTAL", "CKMB", "CKMBINDEX", "TROPONINI" Lab Results  Component Value Date   DDIMER 2.11 (H) 01/02/2022   Does the Patient currently have chest pain? No    R Recommendations: See Admitting Provider Note  Report given to:   Additional Notes: Pt is AO x 4, foley catheter from home for sepsis precaution, increase anasarca with CKD Hx. Poor output after lasix given pt got 40 mg Lasix IV only 300 mL urine out. SB on the monitor the entire time. Wound present on her right side bottom on healing stage.

## 2023-03-22 NOTE — ED Triage Notes (Signed)
Patient BIB [facility] c/o increasing shob today.  Patient reports dealing with excessive swelling for a month and feels like her lasix needs to be increased.  Patient has edema throughout.

## 2023-03-22 NOTE — H&P (Addendum)
History and Physical    Patient: Kara Mullins UEA:[id number] DOB: 1951/08/26 DOA: 03/22/2023 DOS: the patient was seen and examined on 03/22/2023 PCP: [name], [name], NP  Patient coming from: Home via EMS  Chief Complaint:  Chief Complaint  Patient presents with   Shortness of Breath   HPI: Kara Mullins is a 72 y.o. female with medical history significant of hypertension, hyperlipidemia, diastolic congestive heart failure, CKD stage V, and generalized anxiety disorder who presents with complaints of shortness of breath starting yesterday.  Patient just recently hospitalized 5/14-5/19 for diastolic congestive heart failure exacerbation with concern for cardiorenal syndrome.  She had been evaluated by cardiology and nephrology with improvement in symptoms with IV diuretics.  She makes note that she does not want to go on dialysis.  After getting home patient reported that her legs went down for a little bit of time.  The patient's daughter helps her managing her medications and reports that she has been taking torsemide 40 mg twice daily as prescribed.  Daughter makes note that they have not really been able to check her weight because of her debility as she is mostly bedbound.  Patient had been having a mild cough and they have been trying to restrict her fluids to 1200 mL/day.  2 days ago she thinks she may have had too much fluid which may have brought on the symptoms.  Denies having any fevers or chills.  Normally patient is not on oxygen.  She was supposed to follow-up with cardiology but could not make the appointment due to family having difficult time getting her out of the house.  In the hospital patient was noted to be afebrile with pulse 51-58, blood pressures elevated up to 188/95, and O2 saturations currently maintained on 1-2 L of nasal cannula oxygen.  Patient was never noted to be hypoxic but was placed on oxygen for comfort.  Labs noted BNP 715.1, WBC 3.1, hemoglobin 11.2, CO2 21,  BUN 78, creatinine 3.88, calcium 7.1, and high-sensitivity troponins negative x 2.  Chest x-ray noted stable moderate to marked severity bilateral airspace disease with stable small to moderate-sized bilateral pleural effusions.  Patient has been given Lasix 40 mg IV Review of Systems: As mentioned in the history of present illness. All other systems reviewed and are negative. Past Medical History:  Diagnosis Date   Chronic combined systolic and diastolic congestive heart failure (HCC) 11/09/2021   Generalized anxiety disorder    Hyperlipidemia 11/09/2021   Hypertension    Stage 3b chronic kidney disease (CKD) (HCC) 11/09/2021   Past Surgical History:  Procedure Laterality Date   CATARACT EXTRACTION, BILATERAL     combined systolic and diastolic congestive heart failure      Social History:  reports that she has never smoked. She has never used smokeless tobacco. She reports that she does not currently use alcohol. She reports that she does not use drugs.  Allergies  Allergen Reactions   Crestor [Rosuvastatin] Other (See Comments)    "Felt like she couldn't swallow"   Nitrofuran Derivatives     Family History  Problem Relation Age of Onset   Throat cancer Father     Prior to Admission medications   Medication Sig Start Date End Date Taking? Authorizing Provider  carvedilol (COREG) 3.125 MG tablet Take 3.125 mg by mouth 2 (two) times daily with a meal.    [provider]  cholecalciferol (VITAMIN D3) 25 MCG (1000 UNIT) tablet Take 1,000 Units by mouth daily.  [provider]  Epoetin Alfa-epbx (RETACRIT IJ) Inject 20,000 Units as directed every 14 (fourteen) days.    [provider]  feeding supplement (ENSURE ENLIVE / ENSURE PLUS) LIQD Take 237 mLs by mouth 2 (two) times daily between meals. 06/17/22   Berton Mount I, MD  ferrous sulfate 325 (65 FE) MG EC tablet Take 1 tablet (325 mg total) by mouth 2 (two) times daily. 03/03/23 04/02/23  Lanae Boast,  MD  folic acid (FOLVITE) 1 MG tablet Take 1 tablet (1 mg total) by mouth daily. 03/04/23 04/03/23  Lanae Boast, MD  hydrALAZINE (APRESOLINE) 50 MG tablet TAKE 1 TABLET(50 MG) BY MOUTH EVERY 8 HOURS Patient taking differently: Take 25 mg by mouth in the morning and at bedtime. 11/21/22   [name], [name], NP  Menthol-Methyl Salicylate (MUSCLE RUB) 10-15 % CREA Apply 1 Application topically daily as needed for muscle pain.    [provider]  Multiple Vitamin (MULTIVITAMIN ADULT PO) Take 1 tablet by mouth daily.    [provider]  sevelamer carbonate (RENVELA) 800 MG tablet Take 800 mg by mouth every evening. 07/10/22   [provider]  sodium bicarbonate 650 MG tablet Take 650 mg by mouth 2 (two) times daily.    [provider]  torsemide 40 MG TABS Take 40 mg by mouth 2 (two) times daily. 03/03/23 04/02/23  Lanae Boast, MD    Physical Exam: Vitals:   03/22/23 0610 03/22/23 0615 03/22/23 0630 03/22/23 0738  BP: (!) 169/83   (!) 165/90  Pulse: (!) 58 (!) 55 (!) 57 (!) 56  Resp: 13 16 18 20   Temp:      TempSrc:      SpO2: 100% 100% 100% 100%  Weight:      Height:       Constitutional: Elderly female who appears to be chronically ill Eyes: PERRL, lids and conjunctivae normal ENMT: Mucous membranes are moist.  Neck: normal, supple, JVD present. Respiratory: Decreased overall aeration with some intermittent crackles noted in the lower lung fields.  Patient currently on 1 to 2 L of nasal cannula oxygen. Cardiovascular: Regular rate and rhythm, no murmurs / rubs / gallops.  +2 pitting bilateral lower extremity edema Abdomen: no tenderness, no masses palpated. No hepatosplenomegaly. Bowel sounds positive.  Musculoskeletal: no clubbing / cyanosis. No joint deformity upper and lower extremities. Good ROM, no contractures.   Neurologic: CN 2-12 grossly intact.   Psychiatric: Normal judgment and insight. Alert and oriented x 3. Normal mood.   Data Reviewed:  EKG  revealed sinus bradycardia at 60 bpm with first-degree heart block and QTc 511.  Reviewed labs, imaging, and pertinent records as noted elsewhere in this document  Assessment and Plan: Acute respiratory distress secondary to acute on chronic heart failure with preserved EF Pleural effusion Patient presents with complaints of progressively worsening shortness of breath.  Echocardiogram from 5/15 had noted EF to be 50 to 55% with severe concentric left ventricular hypertrophy.  BNP 715.1 which appearslower than previous admission of 1037.5 on 5/14.  Chest x-ray noted stable moderate to marked severity bilateral airspace disease with stable small to moderate-sized bilateral pleural effusions.  Family reports compliance with torsemide 40 mg twice daily.  Patient had initially been given Lasix 40 mg IV.  Patient possibly needs adjustment to oral torsemide  -Admit to a cardiac telemetry bed -Heart failure order set utilized -Strict I&O's and daily weights -1200 mL fluid restriction daily -Lasix 80 mg IV TID -Patient cardiology consultative  services we will follow-up for any further recommendations  Leukopenia Acute.  White blood cell count 3.1.  No other SIRS criteria met. -Orders placed to check blood cultures if patient develops fever -Check procalcitonin -Monitor off of antibiotics at this time  Prolonged QT interval QTc 511 -Avoid QT prolonging medications -Correct electrolyte abnormalities  Anemia chronic kidney disease Hemoglobin 11.2 which appears improved from patient's baseline.  Patient on Retacrit injections every 2 weeks.  Chronic kidney disease stage V Creatinine 3.88 with BUN 78.  Records noted creatinine only around 4.  Patient does not want to go on hemodialysis. -Continue to monitor kidney function with IV diuresis  Sacral pressure wound Daughter makes note that her mattress for the home hospital bed is in need of replacement. -Low-air-loss mattress  replacement -Transitions consulted to see if they could help assist   Debility Patient is essentially bedbound at baseline.     DVT prophylaxis: Heparin Advanc.e Care Planning:   Code Status: Full Code   Consults: Cardiology  Family Communication: Daughter updated at bedside  Severity of Illness: The appropriate patient status for this patient is INPATIENT. Inpatient status is judged to be reasonable and necessary in order to provide the required intensity of service to ensure the patient's safety. The patient's presenting symptoms, physical exam findings, and initial radiographic and laboratory data in the context of their chronic comorbidities is felt to place them at high risk for further clinical deterioration. Furthermore, it is not anticipated that the patient will be medically stable for discharge from the hospital within 2 midnights of admission.   * I certify that at the point of admission it is my clinical judgment that the patient will require inpatient hospital care spanning beyond 2 midnights from the point of admission due to high intensity of service, high risk for further deterioration and high frequency of surveillance required.*  Author: Clydie Braun, MD 03/22/2023 8:19 AM  For on call review [email or web address].

## 2023-03-22 NOTE — ED Notes (Signed)
Pt transferred to ED 2 and received for care at this time.  This RN introduced self to pt and daughter at bedside.  Bed in lowest position, wheels locked.  Call bell within reach.

## 2023-03-22 NOTE — ED Provider Notes (Signed)
[facility] Provider Note   CSN: [id number] Arrival date & time: 03/22/23  0030     History  Chief Complaint  Patient presents with   Shortness of Breath    Kara Mullins is a 72 y.o. female.  The history is provided by the patient and a relative. The history is limited by the condition of the patient.  Shortness of Breath Severity:  Severe Onset quality:  Gradual Duration:  1 day Timing:  Constant Progression:  Worsening Chronicity:  Recurrent Context: not URI   Relieved by:  Nothing Worsened by:  Nothing Ineffective treatments:  Diuretics Associated symptoms: no fever   Risk factors: no recent surgery   Patient with CHF on home diuretics presents with anasarca and SOB x 1 day.  Due to ongoing transportation issues patient was seen virtually this week but no changes were made to medications.  Patient does not know if weight has been increased due to inability to stand.      Past Medical History:  Diagnosis Date   Chronic combined systolic and diastolic congestive heart failure (HCC) 11/09/2021   Generalized anxiety disorder    Hyperlipidemia 11/09/2021   Hypertension    Stage 3b chronic kidney disease (CKD) (HCC) 11/09/2021     Home Medications Prior to Admission medications   Medication Sig Start Date End Date Taking? Authorizing Provider  carvedilol (COREG) 3.125 MG tablet Take 3.125 mg by mouth 2 (two) times daily with a meal.    [provider]  cholecalciferol (VITAMIN D3) 25 MCG (1000 UNIT) tablet Take 1,000 Units by mouth daily.    [provider]  Epoetin Alfa-epbx (RETACRIT IJ) Inject 20,000 Units as directed every 14 (fourteen) days.    [provider]  feeding supplement (ENSURE ENLIVE / ENSURE PLUS) LIQD Take 237 mLs by mouth 2 (two) times daily between meals. 06/17/22   Berton Mount I, MD  ferrous sulfate 325 (65 FE) MG EC tablet Take 1 tablet (325 mg total) by mouth 2 (two)  times daily. 03/03/23 04/02/23  Lanae Boast, MD  folic acid (FOLVITE) 1 MG tablet Take 1 tablet (1 mg total) by mouth daily. 03/04/23 04/03/23  Lanae Boast, MD  hydrALAZINE (APRESOLINE) 50 MG tablet TAKE 1 TABLET(50 MG) BY MOUTH EVERY 8 HOURS Patient taking differently: Take 25 mg by mouth in the morning and at bedtime. 11/21/22   [name], [name], NP  Menthol-Methyl Salicylate (MUSCLE RUB) 10-15 % CREA Apply 1 Application topically daily as needed for muscle pain.    [provider]  Multiple Vitamin (MULTIVITAMIN ADULT PO) Take 1 tablet by mouth daily.    [provider]  sevelamer carbonate (RENVELA) 800 MG tablet Take 800 mg by mouth every evening. 07/10/22   [provider]  sodium bicarbonate 650 MG tablet Take 650 mg by mouth 2 (two) times daily.    [provider]  torsemide 40 MG TABS Take 40 mg by mouth 2 (two) times daily. 03/03/23 04/02/23  Lanae Boast, MD      Allergies    Crestor [rosuvastatin] and Nitrofuran derivatives    Review of Systems   Review of Systems  Constitutional:  Negative for fever.  HENT:  Negative for facial swelling.   Respiratory:  Positive for shortness of breath.   Cardiovascular:  Positive for leg swelling.  All other systems reviewed and are negative.   Physical Exam Updated Vital Signs BP (!) 183/90   Pulse (!) 58  Resp 15   Ht 5\' 5"  (1.651 m)   Wt 89 kg   SpO2 100%   BMI 32.65 kg/m  Physical Exam Vitals and nursing note reviewed.  Constitutional:      General: She is not in acute distress.    Appearance: She is well-developed.  HENT:     Head: Normocephalic and atraumatic.     Nose: Nose normal.  Eyes:     Pupils: Pupils are equal, round, and reactive to light.  Cardiovascular:     Rate and Rhythm: Normal rate and regular rhythm.     Pulses: Normal pulses.     Heart sounds: Normal heart sounds.  Pulmonary:     Effort: Pulmonary effort is normal. No respiratory distress.     Breath sounds: Examination of  the right-middle field reveals decreased breath sounds. Examination of the left-middle field reveals decreased breath sounds. Examination of the right-lower field reveals decreased breath sounds. Examination of the left-lower field reveals decreased breath sounds. Decreased breath sounds and rhonchi present.  Abdominal:     General: Bowel sounds are normal. There is no distension.     Palpations: Abdomen is soft.     Tenderness: There is no abdominal tenderness. There is no guarding or rebound.  Genitourinary:    Vagina: No vaginal discharge.  Musculoskeletal:        General: Normal range of motion.     Cervical back: Normal range of motion and neck supple.     Right lower leg: Edema present.     Left lower leg: Edema present.     Comments: LUE edema   Skin:    General: Skin is dry.     Capillary Refill: Capillary refill takes less than 2 seconds.     Findings: No erythema or rash.  Neurological:     General: No focal deficit present.     Deep Tendon Reflexes: Reflexes normal.  Psychiatric:        Mood and Affect: Mood normal.     ED Results / Procedures / Treatments   Labs (all labs ordered are listed, but only abnormal results are displayed) Results for orders placed or performed during the hospital encounter of 03/22/23  CBC with Differential  Result Value Ref Range   WBC 3.1 (L) 4.0 - 10.5 K/uL   RBC 4.02 3.87 - 5.11 MIL/uL   Hemoglobin 11.2 (L) 12.0 - 15.0 g/dL   HCT 40.9 81.1 - 91.4 %   MCV 90.3 80.0 - 100.0 fL   MCH 27.9 26.0 - 34.0 pg   MCHC 30.9 30.0 - 36.0 g/dL   RDW 78.2 (H) 95.6 - 21.3 %   Platelets 171 150 - 400 K/uL   nRBC 0.0 0.0 - 0.2 %   Neutrophils Relative % 62 %   Neutro Abs 1.9 1.7 - 7.7 K/uL   Lymphocytes Relative 30 %   Lymphs Abs 0.9 0.7 - 4.0 K/uL   Monocytes Relative 4 %   Monocytes Absolute 0.1 0.1 - 1.0 K/uL   Eosinophils Relative 3 %   Eosinophils Absolute 0.1 0.0 - 0.5 K/uL   Basophils Relative 1 %   Basophils Absolute 0.0 0.0 - 0.1 K/uL    Immature Granulocytes 0 %   Abs Immature Granulocytes 0.01 0.00 - 0.07 K/uL  Basic metabolic panel  Result Value Ref Range   Sodium 137 135 - 145 mmol/L   Potassium 4.1 3.5 - 5.1 mmol/L   Chloride 105 98 - 111 mmol/L   CO2  21 (L) 22 - 32 mmol/L   Glucose, Bld 80 70 - 99 mg/dL   BUN 78 (H) 8 - 23 mg/dL   Creatinine, Ser 1.61 (H) 0.44 - 1.00 mg/dL   Calcium 7.1 (L) 8.9 - 10.3 mg/dL   GFR, Estimated 12 (L) >60 mL/min   Anion gap 11 5 - 15  Troponin I (High Sensitivity)  Result Value Ref Range   Troponin I (High Sensitivity) 9 <18 ng/L   DG Chest Portable 1 View  Result Date: 03/22/2023 CLINICAL DATA:  Shortness of breath. EXAM: PORTABLE CHEST 1 VIEW COMPARISON:  Feb 25, 2023 FINDINGS: The cardiac silhouette is mildly enlarged and unchanged in size. There is moderate severity calcification of the aortic arch. Moderate to marked severity airspace disease is seen within the mid to lower lung fields, bilaterally. Bilateral small to moderate sized pleural effusions are noted. No pneumothorax is identified. The visualized skeletal structures are unremarkable. IMPRESSION: 1. Stable moderate to marked severity bilateral airspace disease with stable small to moderate sized bilateral pleural effusions. Electronically Signed   By: Aram Candela M.D.   On: 03/22/2023 01:39   VAS [place] UPPER EXTREMITY VENOUS DUPLEX  Result Date: 03/01/2023 UPPER VENOUS STUDY  Patient Name:  FEMALE MASIS  Date of Exam:   02/28/2023 Medical Rec #: [id number]       Accession #:    [id number] Date of Birth: [birth year]      Patient Gender: F Patient Age:   29 years Exam Location:  [facility] Procedure:      VAS [place] UPPER EXTREMITY VENOUS DUPLEX Referring Phys: RAMESH KC --------------------------------------------------------------------------------  Indications: Asymmetrical left upper extremity swelling Risk Factors: CHF. Comparison Study: No prior studies. Performing Technologist: Jean Rosenthal RDMS, RVT  Examination  Guidelines: A complete evaluation includes B-mode imaging, spectral Doppler, color Doppler, and power Doppler as needed of all accessible portions of each vessel. Bilateral testing is considered an integral part of a complete examination. Limited examinations for reoccurring indications may be performed as noted.  Right Findings: +----------+------------+---------+-----------+----------+-------+ RIGHT     CompressiblePhasicitySpontaneousPropertiesSummary +----------+------------+---------+-----------+----------+-------+ Subclavian               Yes       Yes                      +----------+------------+---------+-----------+----------+-------+  Left Findings: +----------+------------+---------+-----------+----------+-------+ LEFT      CompressiblePhasicitySpontaneousPropertiesSummary +----------+------------+---------+-----------+----------+-------+ IJV           Full       Yes       Yes                      +----------+------------+---------+-----------+----------+-------+ Subclavian               Yes       Yes                      +----------+------------+---------+-----------+----------+-------+ Axillary      Full       Yes       Yes                      +----------+------------+---------+-----------+----------+-------+ Brachial      Full                                          +----------+------------+---------+-----------+----------+-------+ Radial  Full                                          +----------+------------+---------+-----------+----------+-------+ Ulnar         Full                                          +----------+------------+---------+-----------+----------+-------+ Cephalic      Full                                          +----------+------------+---------+-----------+----------+-------+ Basilic       Full                                           +----------+------------+---------+-----------+----------+-------+  Summary:  Right: No evidence of thrombosis in the subclavian.  Left: No evidence of deep vein thrombosis in the upper extremity. No evidence of superficial vein thrombosis in the upper extremity. Significant subcutaneous edema noted.  *See table(s) above for measurements and observations.  Diagnosing physician: Gerarda Fraction Electronically signed by Gerarda Fraction on 03/01/2023 at 8:49:32 AM.    Final    ECHOCARDIOGRAM COMPLETE  Result Date: 02/26/2023    ECHOCARDIOGRAM REPORT   Patient Name:   TEHREEM YAMBOR Date of Exam: 02/26/2023 Medical Rec #:  [id number]      Height:       65.0 in Accession #:    [id number]     Weight:       185.2 lb Date of Birth:  Jul 19, 1951     BSA:          1.914 m Patient Age:    71 years       BP:           174/79 mmHg Patient Gender: F              HR:           60 bpm. Exam Location:  Inpatient Procedure: 2D Echo, Cardiac Doppler, Color Doppler and Strain Analysis Indications:    CHF- Acute Diastolic  History:        Patient has prior history of Echocardiogram examinations, most                 recent 06/03/2022. CHF; Risk Factors:Hypertension, Dyslipidemia                 and Stage 3 Chronic Kidney Disease.  Sonographer:    Raeford Razor Referring Phys: [id number] CHRISTOPHER L SCHUMANN IMPRESSIONS  1. Left ventricular ejection fraction, by estimation, is 50 to 55%. The left ventricle has low normal function. The left ventricle has no regional wall motion abnormalities. There is severe concentric left ventricular hypertrophy. Left ventricular diastolic function could not be evaluated. The average left ventricular global longitudinal strain is -13.0 %. The global longitudinal strain is abnormal.  2. Right ventricular systolic function is normal. The right ventricular size is normal. Mildly increased right ventricular wall thickness. There is normal pulmonary artery systolic pressure.  3. Left atrial size was moderately  dilated.  4. Large pleural effusion in both left and right  lateral regions.  5. The mitral valve is abnormal. Mild mitral valve regurgitation. Moderate mitral annular calcification.  6. The aortic valve is tricuspid. There is mild calcification of the aortic valve. There is moderate thickening of the aortic valve. Aortic valve regurgitation is mild. Aortic valve sclerosis/calcification is present, without any evidence of aortic stenosis.  7. The inferior vena cava is normal in size with <50% respiratory variability, suggesting right atrial pressure of 8 mmHg.  8. Cannot exclude a small PFO. Comparison(s): No significant change from prior study. Prior images reviewed side by side. Conclusion(s)/Recommendation(s): Consider future nuclear scintigraphy study to rule out cardiac amyloisosis if clinically indicated. FINDINGS  Left Ventricle: No systolic anterior motion of the mitral valve, no LVOT gradient. Abnormal apical to basal strain ratio, 2.5/1. Left ventricular ejection fraction, by estimation, is 50 to 55%. The left ventricle has low normal function. The left ventricle has no regional wall motion abnormalities. The average left ventricular global longitudinal strain is -13.0 %. The global longitudinal strain is abnormal. The left ventricular internal cavity size was normal in size. There is severe concentric left ventricular hypertrophy. Left ventricular diastolic function could not be evaluated due to mitral annular calcification (moderate or greater). Left ventricular diastolic function could not be evaluated. Right Ventricle: The right ventricular size is normal. Mildly increased right ventricular wall thickness. Right ventricular systolic function is normal. There is normal pulmonary artery systolic pressure. The tricuspid regurgitant velocity is 2.08 m/s, and with an assumed right atrial pressure of 8 mmHg, the estimated right ventricular systolic pressure is 25.3 mmHg. Left Atrium: Left atrial size was  moderately dilated. Right Atrium: Right atrial size was normal in size. Pericardium: Trivial pericardial effusion is present. The pericardial effusion is posterior to the left ventricle. Mitral Valve: The mitral valve is abnormal. Moderate mitral annular calcification. Mild mitral valve regurgitation. Tricuspid Valve: The tricuspid valve is normal in structure. Tricuspid valve regurgitation is mild . No evidence of tricuspid stenosis. Aortic Valve: The aortic valve is tricuspid. There is mild calcification of the aortic valve. There is moderate thickening of the aortic valve. There is mild aortic valve annular calcification. Aortic valve regurgitation is mild. Aortic regurgitation PHT  measures 437 msec. Aortic valve sclerosis/calcification is present, without any evidence of aortic stenosis. Aortic valve mean gradient measures 4.0 mmHg. Aortic valve peak gradient measures 6.9 mmHg. Aortic valve area, by VTI measures 1.92 cm. Pulmonic Valve: The pulmonic valve was grossly normal. Pulmonic valve regurgitation is mild. No evidence of pulmonic stenosis. Aorta: The aortic root and ascending aorta are structurally normal, with no evidence of dilitation. Venous: The inferior vena cava is normal in size with less than 50% respiratory variability, suggesting right atrial pressure of 8 mmHg. IAS/Shunts: The interatrial septum appears to be lipomatous. Cannot exclude a small PFO. Additional Comments: There is a large pleural effusion in both left and right lateral regions.  LEFT VENTRICLE PLAX 2D LVIDd:         4.10 cm     Diastology LVIDs:         3.20 cm     LV e' medial:    3.92 cm/s LV PW:         2.10 cm     LV E/e' medial:  22.7 LV IVS:        1.20 cm     LV e' lateral:   5.44 cm/s LVOT diam:     1.90 cm     LV E/e' lateral: 16.3 LV SV:  79 LV SV Index:   41          2D Longitudinal Strain LVOT Area:     2.84 cm    2D Strain GLS Avg:     -13.0 %  LV Volumes (MOD) LV vol d, MOD A2C: 97.2 ml LV vol d, MOD A4C:  91.0 ml LV vol s, MOD A2C: 52.5 ml LV vol s, MOD A4C: 45.7 ml LV SV MOD A2C:     44.7 ml LV SV MOD A4C:     91.0 ml LV SV MOD BP:      43.1 ml RIGHT VENTRICLE             IVC RV Basal diam:  2.80 cm     IVC diam: 1.90 cm RV S prime:     10.20 cm/s TAPSE (M-mode): 2.6 cm LEFT ATRIUM             Index        RIGHT ATRIUM           Index LA diam:        2.55 cm 1.33 cm/m   RA Area:     12.20 cm LA Vol (A2C):   94.9 ml 49.57 ml/m  RA Volume:   31.40 ml  16.40 ml/m LA Vol (A4C):   53.5 ml 27.94 ml/m LA Biplane Vol: 77.6 ml 40.53 ml/m  AORTIC VALVE AV Area (Vmax):    2.34 cm AV Area (Vmean):   2.06 cm AV Area (VTI):     1.92 cm AV Vmax:           131.00 cm/s AV Vmean:          96.500 cm/s AV VTI:            0.413 m AV Peak Grad:      6.9 mmHg AV Mean Grad:      4.0 mmHg LVOT Vmax:         108.00 cm/s LVOT Vmean:        70.000 cm/s LVOT VTI:          0.280 m LVOT/AV VTI ratio: 0.68 AI PHT:            437 msec  AORTA Ao Root diam: 3.10 cm Ao Asc diam:  3.20 cm MITRAL VALVE                TRICUSPID VALVE MV Area (PHT): 3.60 cm     TR Peak grad:   17.3 mmHg MV Decel Time: 211 msec     TR Vmax:        208.00 cm/s MV E velocity: 88.80 cm/s MV A velocity: 109.00 cm/s  SHUNTS MV E/A ratio:  0.81         Systemic VTI:  0.28 m                             Systemic Diam: 1.90 cm Riley Lam MD Electronically signed by Riley Lam MD Signature Date/Time: 02/26/2023/4:31:56 PM    Final    DG Chest Portable 1 View  Result Date: 02/25/2023 CLINICAL DATA:  Shortness of breath EXAM: PORTABLE CHEST 1 VIEW COMPARISON:  X-ray 08/18/2022 FINDINGS: Overlapping cardiac leads. Enlarged cardiopericardial silhouette with calcified tortuous aorta. Persistent bilateral pleural effusions, left-greater-than-right. The right effusion is slightly larger today. No pneumothorax. Central vascular congestion. Curvature of the spine with degenerative changes. Film is rotated to the left. Osteopenia IMPRESSION:  Bilateral pleural  effusions, left-greater-than-right. The right effusion appears slightly larger today. Enlarged heart with vascular congestion. Electronically Signed   By: Karen Kays M.D.   On: 02/25/2023 12:45    EKG EKG Interpretation  Date/Time:  Saturday March 22 2023 00:35:13 EDT Ventricular Rate:  60 PR Interval:  210 QRS Duration: 106 QT Interval:  511 QTC Calculation: 511 R Axis:   -41 Text Interpretation: Sinus rhythm Left axis deviation Anterior infarct, old Prolonged QT interval Confirmed by Tahira Olivarez ([id number]) on 03/22/2023 12:39:47 AM  Radiology DG Chest Portable 1 View  Result Date: 03/22/2023 CLINICAL DATA:  Shortness of breath. EXAM: PORTABLE CHEST 1 VIEW COMPARISON:  Feb 25, 2023 FINDINGS: The cardiac silhouette is mildly enlarged and unchanged in size. There is moderate severity calcification of the aortic arch. Moderate to marked severity airspace disease is seen within the mid to lower lung fields, bilaterally. Bilateral small to moderate sized pleural effusions are noted. No pneumothorax is identified. The visualized skeletal structures are unremarkable. IMPRESSION: 1. Stable moderate to marked severity bilateral airspace disease with stable small to moderate sized bilateral pleural effusions. Electronically Signed   By: Aram Candela M.D.   On: 03/22/2023 01:39    Procedures Procedures    Medications Ordered in ED Medications  furosemide (LASIX) injection 40 mg (has no administration in time range)    ED Course/ Medical Decision Making/ A&P                             Medical Decision Making Patient with CHF presents with recurrent SOB and CHF  Amount and/or Complexity of Data Reviewed Independent Historian: caregiver    Details: See above  External Data Reviewed: labs, radiology, ECG and notes.    Details: Previous admission labs EKG and CXR reviewed  Labs: ordered.    Details: All labs reviewed: first troponin 9, white count low 3.1, hemoglobin low 11.2, normal  platelet 171.  Normal sodium 137, normal potassium 4.1, elevated creatinine 3.88 slight improved from previous  Radiology: ordered and independent interpretation performed.    Details: CHF with effusions by me  ECG/medicine tests: ordered and independent interpretation performed. Decision-making details documented in ED Course.  Risk Prescription drug management. Decision regarding hospitalization. Risk Details: On increased O2 to keep sats up will need admission .      Final Clinical Impression(s) / ED Diagnoses Final diagnoses:  Long QT interval  Acute right-sided congestive heart failure (HCC)  Anasarca   The patient appears reasonably stabilized for admission considering the current resources, flow, and capabilities available in the ED at this time, and I doubt any other [facility] requiring further screening and/or treatment in the ED prior to admission.  Rx / DC Orders ED Discharge Orders     None         Daena Alper, MD 03/22/23 [phone]

## 2023-03-22 NOTE — Consult Note (Signed)
Cardiology Consultation   Patient ID: Kara Mullins MRN: [id number]; DOB: 10-13-1951  Admit date: 03/22/2023 Date of Consult: 03/22/2023  PCP:  Caesar Bookman, NP   [facility] Providers Cardiologist:  [name], MD        Patient Profile:   Kara Mullins is a 72 y.o. female with a hx of hypertension, hyperlipidemia, chronic lymphedema, CKD stage IV and chronic combined systolic and diastolic heart failure with improved EF who is being seen 03/22/2023 for the evaluation of CHF at the request of Dr. Katrinka Blazing.  History of Present Illness:   Kara Mullins the patient is a 72 year old female with past medical history of hypertension, hyperlipidemia, chronic lymphedema, CKD stage IV and chronic combined systolic and diastolic heart failure with improved EF.  Patient was admitted at [facility] in May 2022 with shortness of breath.  Echocardiogram at that time showed EF 45%, severe LVH concerning for cardiac amyloid.  Workup for amyloidosis showed abnormal light chains.  Hematology service was consulted at the time.  Patient underwent bone marrow biopsy.  Course was complicated by AKI secondary to contrast nephropathy.  Nephrology service was consulted and she underwent IV diuresis with improved EF.  Cardiac MRI in July 2022 showed no evidence of cardiac amyloid, moderate asymmetric LVH not meeting HCM criteria, LVEF 53%, RVEF 57%, no LGE.  She was readmitted in February 2023 with AKI.  Creatinine went up to 4.7 from the previous baseline of 1.3-1.6.  Unfortunately her renal function did not recover significantly.  AKI was felt to be secondary to ATN however could not rule out plasma cell dyscrasia.  She was also treated for urinary tract infection as well.  Lasix, losartan and spironolactone were all discontinued due to AKI.  Palliative care service was consulted at that time.  She was evaluated by nephrology service and felt not to be a good candidate for hemodialysis  or aggressive intervention.    Patient was recently admitted from 5/14 - 5/20 with acute on chronic diastolic heart failure.  Echocardiogram obtained on 02/26/2023 demonstrated EF 50 to 55%, severe LVH, large pleural effusion urine both left and right lateral regions, mild MR.  She underwent IV diuresis with Lasix 80 mg 3 times daily dosing.  She was seen by Dr. Gaynelle Arabian during the hospitalization.  At the time of discharge, she continued to have some degree of lower extremity edema however her breathing has improved.  She was discharged on torsemide 40 mg twice a day.  Since discharge, patient has been essentially bedbound.  According to the daughter who was also in the room, she has been compliant with her torsemide dosage.  She was in her usual state of health until the afternoon of 03/21/2023 at which time she had increasing dyspnea.  She eventually sought medical attention at [facility], ED.  Initial blood work showed creatinine of 3.88.  Serial troponin negative.  BNP was 715 which is lower than the previous admission at which time BNP was 1037.  Hemoglobin 11.2.  Chest x-ray showed stable moderate to marked severity bilateral airspace disease with stable small to moderate-sized bilateral pleural effusion.  EKG showed normal sinus rhythm, no significant ST-T wave changes.   Past Medical History:  Diagnosis Date   Chronic combined systolic and diastolic congestive heart failure (HCC) 11/09/2021   Generalized anxiety disorder    Hyperlipidemia 11/09/2021   Hypertension    Stage 3b chronic kidney disease (CKD) (HCC) 11/09/2021    Past Surgical History:  Procedure Laterality Date   CATARACT EXTRACTION, BILATERAL     combined systolic and diastolic congestive heart failure        Home Medications:  Prior to Admission medications   Medication Sig Start Date End Date Taking? Authorizing Provider  carvedilol (COREG) 3.125 MG tablet Take 3.125 mg by mouth 2 (two) times daily with a meal.   Yes  [provider]  Coenzyme Q10 (CO Q 10 PO) Take 1 capsule by mouth daily.   Yes [provider]  Epoetin Alfa-epbx (RETACRIT IJ) Inject 20,000 Units as directed every 14 (fourteen) days.   Yes [provider]  feeding supplement (ENSURE ENLIVE / ENSURE PLUS) LIQD Take 237 mLs by mouth 2 (two) times daily between meals. 06/17/22  Yes Berton Mount I, MD  ferrous sulfate 325 (65 FE) MG EC tablet Take 1 tablet (325 mg total) by mouth 2 (two) times daily. 03/03/23 04/02/23 Yes Lanae Boast, MD  folic acid (FOLVITE) 1 MG tablet Take 1 tablet (1 mg total) by mouth daily. 03/04/23 04/03/23 Yes Lanae Boast, MD  hydrALAZINE (APRESOLINE) 50 MG tablet TAKE 1 TABLET(50 MG) BY MOUTH EVERY 8 HOURS Patient taking differently: Take 25 mg by mouth in the morning and at bedtime. 11/21/22  Yes [name], [name], NP  Menthol-Methyl Salicylate (MUSCLE RUB) 10-15 % CREA Apply 1 Application topically daily as needed for muscle pain.   Yes [provider]  Multiple Vitamins-Minerals (MULTIVITAMIN ADULT) CHEW Chew 1 each by mouth daily.   Yes [provider]  multivitamin (RENA-VIT) TABS tablet Take 1 tablet by mouth daily.   Yes [provider]  sevelamer carbonate (RENVELA) 800 MG tablet Take 800 mg by mouth daily with supper. 07/10/22  Yes [provider]  sodium bicarbonate 650 MG tablet Take 650 mg by mouth 2 (two) times daily.   Yes [provider]  torsemide (DEMADEX) 10 MG tablet Take 40 mg by mouth 2 (two) times daily.   Yes [provider]  torsemide 40 MG TABS Take 40 mg by mouth 2 (two) times daily. Patient not taking: Reported on 03/22/2023 03/03/23 04/02/23  Lanae Boast, MD    Inpatient Medications: Scheduled Meds:  furosemide  80 mg Intravenous BID   heparin  5,000 Units Subcutaneous Q8H   sodium bicarbonate  650 mg Oral BID   sodium chloride flush  3 mL Intravenous Q12H   Continuous Infusions:  PRN Meds: acetaminophen **OR**  acetaminophen, albuterol, hydrALAZINE  Allergies:    Allergies  Allergen Reactions   Crestor [Rosuvastatin] Other (See Comments)    "Felt like she couldn't swallow"   Nitrofuran Derivatives Other (See Comments)    Unknown reaction    Social History:   Social History   Socioeconomic History   Marital status: Divorced    Spouse name: Not on file   Number of children: 1   Years of education: Not on file   Highest education level: Not on file  Occupational History   Occupation: retired  Tobacco Use   Smoking status: Never   Smokeless tobacco: Never  Vaping Use   Vaping Use: Never used  Substance and Sexual Activity   Alcohol use: Not Currently   Drug use: Never   Sexual activity: Not Currently  Other Topics Concern   Not on file  Social History Narrative   ** Merged History Encounter **       Tobacco use, amount per day now: N/A Past tobacco use, amount per day: N/A How many years  did you use tobacco: N/A Alcohol use (drinks per week): N/A Diet: Do you drink/eat things with caffeine: Coffee Marital status: Divorced                                    What year were you married? 1992 Do you live in a house, apartment, assisted living, condo, trailer, etc.? Is it one or more stories? How many persons live in your home? 2 Do you have pets in your home?( please list) No Highest Level of e   ducation completed? High School Current or past profession: Restaurant Employee Do you exercise?   Yes                               Type and how often? Walk Do you have a living will? No Do you have a DNR form?    No                               If    not, do you want to discuss one? Do you have signed POA/HPOA forms? No                       If so, please bring to you appointment  Do you have any difficulty bathing or dressing yourself? No Do you have any difficulty preparing food or eating? No    Do you have any difficulty managing your medications? No Do you have any  difficulty managing your finances? No Do you have any difficulty affording your medications? No   Social Determinants of Health   Financial Resource Strain: Low Risk  (06/11/2022)   Overall Financial Resource Strain (CARDIA)    Difficulty of Paying Living Expenses: Not very hard  Food Insecurity: No Food Insecurity (02/26/2023)   Hunger Vital Sign    Worried About Running Out of Food in the Last Year: Never true    Ran Out of Food in the Last Year: Never true  Transportation Needs: No Transportation Needs (02/26/2023)   PRAPARE - Administrator, Civil Service (Medical): No    Lack of Transportation (Non-Medical): No  Physical Activity: Not on file  Stress: Not on file  Social Connections: Not on file  Intimate Partner Violence: Not At Risk (02/26/2023)   Humiliation, Afraid, Rape, and Kick questionnaire    Fear of Current or Ex-Partner: No    Emotionally Abused: No    Physically Abused: No    Sexually Abused: No    Family History:    Family History  Problem Relation Age of Onset   Throat cancer Father      ROS:  Please see the history of present illness.   All other ROS reviewed and negative.     Physical Exam/Data:   Vitals:   03/22/23 0615 03/22/23 0630 03/22/23 0738 03/22/23 1000  BP:   (!) 165/90 133/71  Pulse: (!) 55 (!) 57 (!) 56 (!) 59  Resp: 16 18 20 15   Temp:    98.3 F (36.8 C)  TempSrc:    Oral  SpO2: 100% 100% 100% 100%  Weight:      Height:       No intake or output data in the 24 hours ending 03/22/23 1241    03/22/2023  12:32 AM 03/05/2023    1:22 PM 03/02/2023    8:47 AM  Last 3 Weights  Weight (lbs) 196 lb 3.4 oz 195 lb 195 lb 12.8 oz  Weight (kg) 89 kg 88.451 kg 88.814 kg     Body mass index is 32.65 kg/m.  General:  Well nourished, well developed, in no acute distress HEENT: normal Neck: no JVD Vascular: No carotid bruits; Distal pulses 2+ bilaterally Cardiac:  normal S1, S2; RRR; no murmur  Lungs:  clear to auscultation  bilaterally, no wheezing, rhonchi or rales  Abd: soft, nontender, no hepatomegaly  Ext: no edema Musculoskeletal:  No deformities, BUE and BLE strength normal and equal Skin: warm and dry  Neuro:  CNs 2-12 intact, no focal abnormalities noted Psych:  Normal affect   EKG:  The EKG was personally reviewed and demonstrates: Normal sinus rhythm, no send ST-T wave changes Telemetry:  Telemetry was personally reviewed and demonstrates: Normal sinus rhythm, no significant ventricular ectopy  Relevant CV Studies:  Echo 02/26/2023  1. Left ventricular ejection fraction, by estimation, is 50 to 55%. The  left ventricle has low normal function. The left ventricle has no regional  wall motion abnormalities. There is severe concentric left ventricular  hypertrophy. Left ventricular  diastolic function could not be evaluated. The average left ventricular  global longitudinal strain is -13.0 %. The global longitudinal strain is  abnormal.   2. Right ventricular systolic function is normal. The right ventricular  size is normal. Mildly increased right ventricular wall thickness. There  is normal pulmonary artery systolic pressure.   3. Left atrial size was moderately dilated.   4. Large pleural effusion in both left and right lateral regions.   5. The mitral valve is abnormal. Mild mitral valve regurgitation.  Moderate mitral annular calcification.   6. The aortic valve is tricuspid. There is mild calcification of the  aortic valve. There is moderate thickening of the aortic valve. Aortic  valve regurgitation is mild. Aortic valve sclerosis/calcification is  present, without any evidence of aortic  stenosis.   7. The inferior vena cava is normal in size with <50% respiratory  variability, suggesting right atrial pressure of 8 mmHg.   8. Cannot exclude a small PFO.   Laboratory Data:  High Sensitivity Troponin:   Recent Labs  Lab 02/25/23 1226 02/25/23 1403 03/22/23 0050 03/22/23 0248   TROPONINIHS 23* 38* 9 9     Chemistry Recent Labs  Lab 03/22/23 0050  NA 137  K 4.1  CL 105  CO2 21*  GLUCOSE 80  BUN 78*  CREATININE 3.88*  CALCIUM 7.1*  GFRNONAA 12*  ANIONGAP 11    No results for input(s): "PROT", "ALBUMIN", "AST", "ALT", "ALKPHOS", "BILITOT" in the last 168 hours. Lipids No results for input(s): "CHOL", "TRIG", "HDL", "LABVLDL", "LDLCALC", "CHOLHDL" in the last 168 hours.  Hematology Recent Labs  Lab 03/22/23 0050  WBC 3.1*  RBC 4.02  HGB 11.2*  HCT 36.3  MCV 90.3  MCH 27.9  MCHC 30.9  RDW 16.7*  PLT 171   Thyroid No results for input(s): "TSH", "FREET4" in the last 168 hours.  BNP Recent Labs  Lab 03/22/23 0050  BNP 715.1*    DDimer No results for input(s): "DDIMER" in the last 168 hours.   Radiology/Studies:  DG Chest Portable 1 View  Result Date: 03/22/2023 CLINICAL DATA:  Shortness of breath. EXAM: PORTABLE CHEST 1 VIEW COMPARISON:  Feb 25, 2023 FINDINGS: The cardiac silhouette is mildly enlarged and unchanged  in size. There is moderate severity calcification of the aortic arch. Moderate to marked severity airspace disease is seen within the mid to lower lung fields, bilaterally. Bilateral small to moderate sized pleural effusions are noted. No pneumothorax is identified. The visualized skeletal structures are unremarkable. IMPRESSION: 1. Stable moderate to marked severity bilateral airspace disease with stable small to moderate sized bilateral pleural effusions. Electronically Signed   By: Aram Candela M.D.   On: 03/22/2023 01:39     Assessment and Plan:   Chronic diastolic heart failure: Unfortunately with poor renal function, patient will have labile blood pressure and volume overload issue.  She has chronic lower extremity lymphedema, according to daughter, legs have been swollen for 2 years.  Her lower extremity edema has not gone away despite recent aggressive diuresis.  She has been compliant with torsemide 40 mg twice a day  dosing.  According to family, since recent discharge 3 weeks ago, she was initially doing well however became short of breath yesterday.  Chest x-ray showed stable moderate to marked severity bilateral airspace disease with stable small to moderate-sized bilateral pleural effusion.  -Patient is lung is clear, no significant JVD on exam, however appears to have significant opacity seen on chest x-ray that is similar to the last admission.  Consider IV Lasix 80 mg 3 times daily dosing for now.  Hypertension: On carvedilol and hydralazine at home  Hyperlipidemia  Chronic lymphedema: Patient has been having leg edema for the past 2 years.  CKD stage IV   Risk Assessment/Risk Scores:        New York Heart Association (NYHA) Functional Class NYHA Class IV        For questions or updates, please contact [facility] Please consult [email or web address] for contact info under    Ramond Dial, [place]  03/22/2023 12:41 PM

## 2023-03-23 ENCOUNTER — Inpatient Hospital Stay (HOSPITAL_COMMUNITY): Payer: Medicare (Managed Care)

## 2023-03-23 DIAGNOSIS — I5021 Acute systolic (congestive) heart failure: Secondary | ICD-10-CM | POA: Diagnosis not present

## 2023-03-23 DIAGNOSIS — I5033 Acute on chronic diastolic (congestive) heart failure: Secondary | ICD-10-CM | POA: Diagnosis not present

## 2023-03-23 LAB — HEPATIC FUNCTION PANEL
ALT: 14 U/L (ref 0–44)
AST: 23 U/L (ref 15–41)
Albumin: 1.8 g/dL — ABNORMAL LOW (ref 3.5–5.0)
Alkaline Phosphatase: 73 U/L (ref 38–126)
Bilirubin, Direct: 0.1 mg/dL (ref 0.0–0.2)
Indirect Bilirubin: 0.3 mg/dL (ref 0.3–0.9)
Total Bilirubin: 0.4 mg/dL (ref 0.3–1.2)
Total Protein: 5.3 g/dL — ABNORMAL LOW (ref 6.5–8.1)

## 2023-03-23 LAB — ECHOCARDIOGRAM LIMITED
Height: 65 in
S' Lateral: 4 cm
Weight: 3146.41 oz

## 2023-03-23 LAB — CBC
HCT: 29.5 % — ABNORMAL LOW (ref 36.0–46.0)
Hemoglobin: 9 g/dL — ABNORMAL LOW (ref 12.0–15.0)
MCH: 27.6 pg (ref 26.0–34.0)
MCHC: 30.5 g/dL (ref 30.0–36.0)
MCV: 90.5 fL (ref 80.0–100.0)
Platelets: 132 10*3/uL — ABNORMAL LOW (ref 150–400)
RBC: 3.26 MIL/uL — ABNORMAL LOW (ref 3.87–5.11)
RDW: 16.7 % — ABNORMAL HIGH (ref 11.5–15.5)
WBC: 3.4 10*3/uL — ABNORMAL LOW (ref 4.0–10.5)
nRBC: 0 % (ref 0.0–0.2)

## 2023-03-23 LAB — BASIC METABOLIC PANEL
Anion gap: 8 (ref 5–15)
BUN: 76 mg/dL — ABNORMAL HIGH (ref 8–23)
CO2: 22 mmol/L (ref 22–32)
Calcium: 6.7 mg/dL — ABNORMAL LOW (ref 8.9–10.3)
Chloride: 106 mmol/L (ref 98–111)
Creatinine, Ser: 3.83 mg/dL — ABNORMAL HIGH (ref 0.44–1.00)
GFR, Estimated: 12 mL/min — ABNORMAL LOW (ref >60)
Glucose, Bld: 66 mg/dL — ABNORMAL LOW (ref 70–99)
Potassium: 3.6 mmol/L (ref 3.5–5.1)
Sodium: 136 mmol/L (ref 135–145)

## 2023-03-23 MED ORDER — ISOSORBIDE MONONITRATE ER 30 MG PO TB24
30.0000 mg | ORAL_TABLET | Freq: Every day | ORAL | Status: DC
  Administered 2023-03-23 – 2023-03-24 (×2): 30 mg via ORAL
  Filled 2023-03-23 (×2): qty 1

## 2023-03-23 MED ORDER — CARVEDILOL 6.25 MG PO TABS
6.2500 mg | ORAL_TABLET | Freq: Two times a day (BID) | ORAL | Status: DC
  Administered 2023-03-23 – 2023-04-01 (×13): 6.25 mg via ORAL
  Filled 2023-03-23 (×20): qty 1

## 2023-03-23 NOTE — Progress Notes (Signed)
Cardiology Progress Note  Patient ID: Kara Mullins MRN: [id number] DOB: 08/03/1951 Date of Encounter: 03/23/2023  Primary Cardiologist: [name], MD  Subjective   Chief Complaint: SOB  HPI: Urine output not accurate.  Reports she feels slightly better.  Still short of breath.  Not back to baseline.  ROS:  All other ROS reviewed and negative. Pertinent positives noted in the HPI.     Inpatient Medications  Scheduled Meds:  carvedilol  3.125 mg Oral BID WC   Chlorhexidine Gluconate Cloth  6 each Topical Daily   ferrous sulfate  325 mg Oral BID   folic acid  1 mg Oral Daily   furosemide  80 mg Intravenous TID   heparin  5,000 Units Subcutaneous Q8H   hydrALAZINE  25 mg Oral BID   sevelamer carbonate  800 mg Oral Q supper   sodium bicarbonate  650 mg Oral BID   sodium chloride flush  3 mL Intravenous Q12H   Continuous Infusions:  PRN Meds: acetaminophen **OR** acetaminophen, albuterol, hydrALAZINE, Muscle Rub   Vital Signs   Vitals:   03/22/23 1934 03/22/23 2323 03/23/23 0427 03/23/23 0800  BP: (!) 116/57 97/64 139/63 (!) 164/71  Pulse: 61 65 62 64  Resp: 14 16 14 17   Temp: 97.9 F (36.6 C) 97.6 F (36.4 C) 98 F (36.7 C) (!) 97.5 F (36.4 C)  TempSrc: Oral  Axillary Oral  SpO2: 97% 96% 95% 98%  Weight:   89.2 kg   Height:       No intake or output data in the 24 hours ending 03/23/23 1006    03/23/2023    4:27 AM 03/22/2023   12:32 AM 03/05/2023    1:22 PM  Last 3 Weights  Weight (lbs) 196 lb 10.4 oz 196 lb 3.4 oz 195 lb  Weight (kg) 89.2 kg 89 kg 88.451 kg      Telemetry  Overnight telemetry shows SR 70s, which I personally reviewed.    Physical Exam   Vitals:   03/22/23 1934 03/22/23 2323 03/23/23 0427 03/23/23 0800  BP: (!) 116/57 97/64 139/63 (!) 164/71  Pulse: 61 65 62 64  Resp: 14 16 14 17   Temp: 97.9 F (36.6 C) 97.6 F (36.4 C) 98 F (36.7 C) (!) 97.5 F (36.4 C)  TempSrc: Oral  Axillary Oral  SpO2: 97% 96% 95% 98%   Weight:   89.2 kg   Height:       No intake or output data in the 24 hours ending 03/23/23 1006     03/23/2023    4:27 AM 03/22/2023   12:32 AM 03/05/2023    1:22 PM  Last 3 Weights  Weight (lbs) 196 lb 10.4 oz 196 lb 3.4 oz 195 lb  Weight (kg) 89.2 kg 89 kg 88.451 kg    Body mass index is 32.72 kg/m.  General: Well nourished, well developed, in no acute distress Head: Atraumatic, normal size  Eyes: PEERLA, EOMI  Neck: Supple, JVD 8 to 10 cm of water Endocrine: No thryomegaly Cardiac: Normal S1, S2; RRR; no murmurs, rubs, or gallops Lungs: Diminished breath sounds bilaterally Abd: Soft, nontender, no hepatomegaly  Ext: 1+ pitting edema Musculoskeletal: No deformities, BUE and BLE strength normal and equal Skin: Warm and dry, no rashes   Neuro: Alert and oriented to person, place, time, and situation, CNII-XII grossly intact, no focal deficits  Psych: Normal mood and affect   Labs  High Sensitivity Troponin:   Recent Labs  Lab 02/25/23 1226  02/25/23 1403 03/22/23 0050 03/22/23 0248  TROPONINIHS 23* 38* 9 9     Cardiac EnzymesNo results for input(s): "TROPONINI" in the last 168 hours. No results for input(s): "TROPIPOC" in the last 168 hours.  Chemistry Recent Labs  Lab 03/22/23 0050 03/23/23 0102 03/23/23 0105  NA 137  --  136  K 4.1  --  3.6  CL 105  --  106  CO2 21*  --  22  GLUCOSE 80  --  66*  BUN 78*  --  76*  CREATININE 3.88*  --  3.83*  CALCIUM 7.1*  --  6.7*  PROT  --  5.3*  --   ALBUMIN  --  1.8*  --   AST  --  23  --   ALT  --  14  --   ALKPHOS  --  73  --   BILITOT  --  0.4  --   GFRNONAA 12*  --  12*  ANIONGAP 11  --  8    Hematology Recent Labs  Lab 03/22/23 0050 03/23/23 0105  WBC 3.1* 3.4*  RBC 4.02 3.26*  HGB 11.2* 9.0*  HCT 36.3 29.5*  MCV 90.3 90.5  MCH 27.9 27.6  MCHC 30.9 30.5  RDW 16.7* 16.7*  PLT 171 132*   BNP Recent Labs  Lab 03/22/23 0050  BNP 715.1*    DDimer No results for input(s): "DDIMER" in the last 168  hours.   Radiology  DG Chest Portable 1 View  Result Date: 03/22/2023 CLINICAL DATA:  Shortness of breath. EXAM: PORTABLE CHEST 1 VIEW COMPARISON:  Feb 25, 2023 FINDINGS: The cardiac silhouette is mildly enlarged and unchanged in size. There is moderate severity calcification of the aortic arch. Moderate to marked severity airspace disease is seen within the mid to lower lung fields, bilaterally. Bilateral small to moderate sized pleural effusions are noted. No pneumothorax is identified. The visualized skeletal structures are unremarkable. IMPRESSION: 1. Stable moderate to marked severity bilateral airspace disease with stable small to moderate sized bilateral pleural effusions. Electronically Signed   By: Aram Candela M.D.   On: 03/22/2023 01:39    Cardiac Studies  TTE 02/26/2023  1. Left ventricular ejection fraction, by estimation, is 50 to 55%. The  left ventricle has low normal function. The left ventricle has no regional  wall motion abnormalities. There is severe concentric left ventricular  hypertrophy. Left ventricular  diastolic function could not be evaluated. The average left ventricular  global longitudinal strain is -13.0 %. The global longitudinal strain is  abnormal.   2. Right ventricular systolic function is normal. The right ventricular  size is normal. Mildly increased right ventricular wall thickness. There  is normal pulmonary artery systolic pressure.   3. Left atrial size was moderately dilated.   4. Large pleural effusion in both left and right lateral regions.   5. The mitral valve is abnormal. Mild mitral valve regurgitation.  Moderate mitral annular calcification.   6. The aortic valve is tricuspid. There is mild calcification of the  aortic valve. There is moderate thickening of the aortic valve. Aortic  valve regurgitation is mild. Aortic valve sclerosis/calcification is  present, without any evidence of aortic  stenosis.   7. The inferior vena cava is  normal in size with <50% respiratory  variability, suggesting right atrial pressure of 8 mmHg.   8. Cannot exclude a small PFO.   Patient Profile  72 year old female with CKD stage IV/V, hypertension, hyperlipidemia, diastolic heart failure admitted  on 03/22/2023 for acute on chronic diastolic heart failure.   Assessment & Plan   # Acute on chronic diastolic heart failure, EF 50-55% #CKD stage IV/V -Appears slightly volume up but not a tremendous amount of volume.  I's and O's are completely inaccurate.  Will continue with Lasix 80 mg IV 3 times daily we can see how she does today.  Likely transition to oral diuretics tomorrow.  She reports she feels somewhat better but not back to baseline. -Procalcitonin's are negative.  We have ordered a chest CT to help define her pleural effusions.  She may end up needing thoracentesis but seems to be doing well. -Continue carvedilol.  Increase to 6.25 mg twice daily.  Continue hydralazine 25 mg 3 times daily.  I have added Imdur 30 mg daily. -Not a candidate for ACE/ARB/ARNI/MRA given advanced kidney disease.  She is not wanting dialysis.  She may benefit from palliative care consultation.  The patient really is not wanting aggressive medical care.  She is currently full code.  I did briefly discuss this with the patient and her family today.  They do want treatment but they are unsure about heroic measures.  # Right pleural effusion -Chest x-ray is impressive.  I wonder if she has a loculated effusion.  Chest CT today.      For questions or updates, please contact [facility] Please consult [email or web address] for contact info under        Signed, Gerri Spore T. Flora Lipps, MD, [facility]   [facility]  03/23/2023 10:06 AM

## 2023-03-23 NOTE — Progress Notes (Signed)
Echocardiogram 2D Echocardiogram has been performed.  [name] 03/23/2023, 1:22 PM

## 2023-03-23 NOTE — Progress Notes (Signed)
PROGRESS NOTE    Kara Mullins  ZOX:[id number] DOB: 01-Sep-1951 DOA: 03/22/2023 PCP: Caesar Bookman, NP  71/M with history of hypertension, diastolic CHF, CKD 5, anxiety disorder presented to the ED with worsening shortness of breath and swelling. -Recently hospitalized 5/14-5/19 with CHF exacerbation and AKI/CKD, cardiorenal syndrome, followed by cards and nephrology, diuresed and transition to torsemide 40 Mg twice daily, she also has significant debility and is mostly nonambulatory, bedbound.  Lives with her daughter, compliant with meds, back in the ED with volume overload, hypertensive, BNP 715, hemoglobin 11, creatinine 3.8, chest x-ray with bilateral airspace disease and small to moderate bilateral effusions   Subjective: -Breathing a little better compared to yesterday  Assessment and Plan:  Acute on chronic heart failure with preserved EF Pleural effusion -Echo from 5/15 had noted EF to be 50 to 55% with severe LVH, normal RV -She is considerably volume overloaded, has 2-3+ edema extending to upper thighs  -Continue Lasix 80 Mg IV 3 times daily  -Cards ordered CT chest will follow-up  -Continue carvedilol, hydralazine, Imdur  -GDMT limited by CKD 5  -Palliative consulted for goals of care    Leukopenia -Mild, monitor   Prolonged QT interval QTc 511 -Caution with meds   Anemia chronic kidney disease -Hemoglobin down to 9.0 which appears to be her baseline, trend   Chronic kidney disease stage V Creatinine 3.88 with BUN 78.  Records noted creatinine only around 4.  Patient does not want to go on hemodialysis. -I worry she is getting closer to needing dialysis however she is nonambulatory, would not be a good candidate -Followed by Dr. Vallery Sa, consult palliative care   Sacral pressure wound -Air overlay mattress, wound care   Debility Patient is essentially bedbound at baseline.    -PT OT   DVT prophylaxis: Heparin Advanc.e Care Planning:   Code Status:  Full Code, recommended consideration of DNR Family Communication: Daughter at bedside Disposition Plan: To be determined  Consultants: Cards   Procedures:   Antimicrobials:    Objective: Vitals:   03/22/23 1934 03/22/23 2323 03/23/23 0427 03/23/23 0800  BP: (!) 116/57 97/64 139/63 (!) 164/71  Pulse: 61 65 62 64  Resp: 14 16 14 17   Temp: 97.9 F (36.6 C) 97.6 F (36.4 C) 98 F (36.7 C) (!) 97.5 F (36.4 C)  TempSrc: Oral  Axillary Oral  SpO2: 97% 96% 95% 98%  Weight:   89.2 kg   Height:       No intake or output data in the 24 hours ending 03/23/23 1032 Filed Weights   03/22/23 0032 03/23/23 0427  Weight: 89 kg 89.2 kg    Examination:  General exam: Chronically ill female sitting up in bed, AAOx3 HEENT: Positive JVD CVS: S1-S2, regular rhythm Lungs: Decreased breath sounds to bases Abdomen: Soft, nontender, bowel sounds present These: 2-3+ edema extending to upper thighs   Data Reviewed:   CBC: Recent Labs  Lab 03/22/23 0050 03/23/23 0105  WBC 3.1* 3.4*  NEUTROABS 1.9  --   HGB 11.2* 9.0*  HCT 36.3 29.5*  MCV 90.3 90.5  PLT 171 132*   Basic Metabolic Panel: Recent Labs  Lab 03/22/23 0050 03/23/23 0105  NA 137 136  K 4.1 3.6  CL 105 106  CO2 21* 22  GLUCOSE 80 66*  BUN 78* 76*  CREATININE 3.88* 3.83*  CALCIUM 7.1* 6.7*   GFR: Estimated Creatinine Clearance: 15.1 mL/min (A) (by C-G formula based on SCr of 3.83 mg/dL (H)). Liver  Function Tests: Recent Labs  Lab 03/23/23 0102  AST 23  ALT 14  ALKPHOS 73  BILITOT 0.4  PROT 5.3*  ALBUMIN 1.8*   No results for input(s): "LIPASE", "AMYLASE" in the last 168 hours. No results for input(s): "AMMONIA" in the last 168 hours. Coagulation Profile: No results for input(s): "INR", "PROTIME" in the last 168 hours. Cardiac Enzymes: No results for input(s): "CKTOTAL", "CKMB", "CKMBINDEX", "TROPONINI" in the last 168 hours. BNP (last 3 results) No results for input(s): "PROBNP" in the last 8760  hours. HbA1C: No results for input(s): "HGBA1C" in the last 72 hours. CBG: No results for input(s): "GLUCAP" in the last 168 hours. Lipid Profile: No results for input(s): "CHOL", "HDL", "LDLCALC", "TRIG", "CHOLHDL", "LDLDIRECT" in the last 72 hours. Thyroid Function Tests: No results for input(s): "TSH", "T4TOTAL", "FREET4", "T3FREE", "THYROIDAB" in the last 72 hours. Anemia Panel: No results for input(s): "VITAMINB12", "FOLATE", "FERRITIN", "TIBC", "IRON", "RETICCTPCT" in the last 72 hours. Urine analysis:    Component Value Date/Time   COLORURINE YELLOW (A) 10/04/2022 1151   APPEARANCEUR TURBID (A) 10/04/2022 1151   LABSPEC 1.012 10/04/2022 1151   PHURINE 8.0 10/04/2022 1151   GLUCOSEU NEGATIVE 10/04/2022 1151   HGBUR NEGATIVE 10/04/2022 1151   BILIRUBINUR Negative 12/05/2022 1454   KETONESUR NEGATIVE 10/04/2022 1151   PROTEINUR Positive (A) 12/05/2022 1454   PROTEINUR >=300 (A) 10/04/2022 1151   UROBILINOGEN 0.2 12/05/2022 1454   UROBILINOGEN 0.2 11/19/2019 2004   NITRITE Negative 12/05/2022 1454   NITRITE NEGATIVE 10/04/2022 1151   LEUKOCYTESUR Moderate (2+) (A) 12/05/2022 1454   LEUKOCYTESUR LARGE (A) 10/04/2022 1151   Sepsis Labs: @LABRCNTIP (procalcitonin:4,lacticidven:4)  )No results found for this or any previous visit (from the past 240 hour(s)).   Radiology Studies: DG Chest Portable 1 View  Result Date: 03/22/2023 CLINICAL DATA:  Shortness of breath. EXAM: PORTABLE CHEST 1 VIEW COMPARISON:  Feb 25, 2023 FINDINGS: The cardiac silhouette is mildly enlarged and unchanged in size. There is moderate severity calcification of the aortic arch. Moderate to marked severity airspace disease is seen within the mid to lower lung fields, bilaterally. Bilateral small to moderate sized pleural effusions are noted. No pneumothorax is identified. The visualized skeletal structures are unremarkable. IMPRESSION: 1. Stable moderate to marked severity bilateral airspace disease with  stable small to moderate sized bilateral pleural effusions. Electronically Signed   By: Aram Candela M.D.   On: 03/22/2023 01:39     Scheduled Meds:  carvedilol  6.25 mg Oral BID WC   Chlorhexidine Gluconate Cloth  6 each Topical Daily   ferrous sulfate  325 mg Oral BID   folic acid  1 mg Oral Daily   furosemide  80 mg Intravenous TID   heparin  5,000 Units Subcutaneous Q8H   hydrALAZINE  25 mg Oral BID   isosorbide mononitrate  30 mg Oral Daily   sevelamer carbonate  800 mg Oral Q supper   sodium bicarbonate  650 mg Oral BID   sodium chloride flush  3 mL Intravenous Q12H   Continuous Infusions:   LOS: 1 day    Time spent:    Zannie Cove, MD [facility]   03/23/2023, 10:32 AM

## 2023-03-24 ENCOUNTER — Telehealth: Payer: Self-pay

## 2023-03-24 DIAGNOSIS — Z515 Encounter for palliative care: Secondary | ICD-10-CM | POA: Diagnosis not present

## 2023-03-24 DIAGNOSIS — Z7189 Other specified counseling: Secondary | ICD-10-CM

## 2023-03-24 DIAGNOSIS — I5033 Acute on chronic diastolic (congestive) heart failure: Secondary | ICD-10-CM | POA: Diagnosis not present

## 2023-03-24 LAB — TSH: TSH: 2.488 u[IU]/mL (ref 0.350–4.500)

## 2023-03-24 LAB — COMPREHENSIVE METABOLIC PANEL
ALT: 14 U/L (ref 0–44)
AST: 26 U/L (ref 15–41)
Albumin: 1.8 g/dL — ABNORMAL LOW (ref 3.5–5.0)
Alkaline Phosphatase: 68 U/L (ref 38–126)
Anion gap: 10 (ref 5–15)
BUN: 76 mg/dL — ABNORMAL HIGH (ref 8–23)
CO2: 21 mmol/L — ABNORMAL LOW (ref 22–32)
Calcium: 6.7 mg/dL — ABNORMAL LOW (ref 8.9–10.3)
Chloride: 104 mmol/L (ref 98–111)
Creatinine, Ser: 4 mg/dL — ABNORMAL HIGH (ref 0.44–1.00)
GFR, Estimated: 11 mL/min — ABNORMAL LOW (ref >60)
Glucose, Bld: 79 mg/dL (ref 70–99)
Potassium: 3.5 mmol/L (ref 3.5–5.1)
Sodium: 135 mmol/L (ref 135–145)
Total Bilirubin: 0.4 mg/dL (ref 0.3–1.2)
Total Protein: 5.3 g/dL — ABNORMAL LOW (ref 6.5–8.1)

## 2023-03-24 LAB — CBC
HCT: 28 % — ABNORMAL LOW (ref 36.0–46.0)
Hemoglobin: 8.4 g/dL — ABNORMAL LOW (ref 12.0–15.0)
MCH: 27.7 pg (ref 26.0–34.0)
MCHC: 30 g/dL (ref 30.0–36.0)
MCV: 92.4 fL (ref 80.0–100.0)
Platelets: 121 10*3/uL — ABNORMAL LOW (ref 150–400)
RBC: 3.03 MIL/uL — ABNORMAL LOW (ref 3.87–5.11)
RDW: 16.6 % — ABNORMAL HIGH (ref 11.5–15.5)
WBC: 2.9 10*3/uL — ABNORMAL LOW (ref 4.0–10.5)
nRBC: 0 % (ref 0.0–0.2)

## 2023-03-24 LAB — GLUCOSE, CAPILLARY: Glucose-Capillary: 89 mg/dL (ref 70–99)

## 2023-03-24 MED ORDER — HYDRALAZINE HCL 25 MG PO TABS
37.5000 mg | ORAL_TABLET | Freq: Two times a day (BID) | ORAL | Status: DC
  Administered 2023-03-24: 37.5 mg via ORAL
  Filled 2023-03-24: qty 2

## 2023-03-24 MED ORDER — ISOSORBIDE MONONITRATE ER 60 MG PO TB24
120.0000 mg | ORAL_TABLET | Freq: Every day | ORAL | Status: DC
  Administered 2023-03-25 – 2023-03-26 (×2): 120 mg via ORAL
  Filled 2023-03-24 (×3): qty 2

## 2023-03-24 MED ORDER — POTASSIUM CHLORIDE CRYS ER 20 MEQ PO TBCR
40.0000 meq | EXTENDED_RELEASE_TABLET | Freq: Once | ORAL | Status: AC
  Administered 2023-03-24: 40 meq via ORAL
  Filled 2023-03-24: qty 2

## 2023-03-24 NOTE — Consult Note (Signed)
   [facility] CM Inpatient Consult   03/24/2023  Luticia Tadros May 24, 1951 [id number]  [facility] [[facility]]  Accountable Care Organization [ACO] Patient:  Primary Care Provider:  Caesar Bookman, NP with [facility]   Patient is currently active with [facility] [[facility]] Care Management for chronic disease management services.  Patient has been engaged by a [facility].  Our community based plan of care has focused on disease management and community resource support.    Patient was discussed in morning progression meeting for ongoing medical management needs and from home with daughter.   3:25 pm, patient asleep on rounds no family currently at bedside, did not awaken, will follow for any new follow needs for disposition and community care coordination.  Plan:  Current disposition is not known. Spoke with Inpatient Transition Of Care [TOC] team member to make aware that [facility] Care Management following.   Of note, [facility] Care Management services does not replace or interfere with any services that are needed or arranged by inpatient [facility] care management team.   For additional questions or referrals please contact:  Charlesetta Shanks, RN BSN CCM [facility]  [phone] business mobile phone Toll free office [phone]  *Concierge Line  [phone] Fax number: [phone] [email or web address]

## 2023-03-24 NOTE — Consult Note (Signed)
Palliative Medicine Inpatient Consult Note  Consulting Provider:  Zannie Cove, MD   Reason for consult:   Palliative Care Consult Services Palliative Medicine Consult  Reason for Consult? goals of care   03/24/2023  HPI:  Per intake H&P --> 71/M with history of hypertension, diastolic CHF, CKD 5, anxiety disorder presented to the ED with worsening shortness of breath and swelling. Had a five day admission in mid May of this year for heart failure exacerbation. The PMT has seen Kara Mullins multiple times since 11/2021. At this point we have been asked to get involved for further goals of care conversations and discussions on code status.   Clinical Assessment/Goals of Care:  *Please note that this is a verbal dictation therefore any spelling or grammatical errors are due to the "Dragon Medical One" system interpretation.  I have reviewed medical records including EPIC notes, labs and imaging, received report from bedside RN, assessed the patient who is lying in bed in NAD.    I met with Kara Mullins and her daughter, Kara Mullins to further discuss diagnosis prognosis, GOC, EOL wishes, disposition and options.   I introduced Palliative Medicine as specialized medical care for people living with serious illness. It focuses on providing relief from the symptoms and stress of a serious illness. The goal is to improve quality of life for both the patient and the family.  Medical History Review and Understanding:  Past medical history reviewed and identified to have chronic kidney disease, hypertension, hyperlipidemia, generalized anxiety, and combined systolic and diastolic heart failure.  Social History:  Kara Mullins lives in a single-family home with her daughter Kara Mullins and her daughters spouse, Kara Mullins.  She is divorced.  She had 2 children, though one of her daughters passed away 4 years ago.  She has 2 adult grandchildren. She worked in [employer] for many years (Kara Mullins for  39+ years).  [name] is a woman of extreme faith and practices within christianity.  Functional and Nutritional State:  Kara Mullins is mostly nonambulatory, she is able to get to the side of the bed. Her functional state has been poor for quite sometime and she identifies that this is quite defeating for her as she took great pride in her independence.  Advance Directives:  A detailed discussion was had today regarding advanced directives.  Patients daughter, Kara Mullins is her surrogate Management consultant.   Code Status:  Concepts specific to code status, artifical feeding and hydration, continued IV antibiotics and rehospitalization was had.  The difference between a aggressive medical intervention path  and a palliative comfort care path for this patient at this time was had.   Encouraged patient/family to consider DNR/DNI status understanding evidenced based poor outcomes in similar hospitalized patient, as the cause of arrest is likely associated with advanced chronic/terminal illness rather than an easily reversible acute cardio-pulmonary event. I explained that DNR/DNI does not change the medical plan and it only comes into effect after a person has arrested (died).  It is a protective measure to keep [place] from harming the patient in their last moments of life.   Kara Mullins vocalizes that she would not desire living on life support. When asked directly if she would like to be a DNAR/DNI she shared that she has extreme faith and God has the last say so. I vocalized agreement but also shared that modern medicine does many things to patients which may prevent Gods work. She shares she "doesn't want to die". I also acknowledged this.  Discussion:  Kara Mullins is aware that her readmission is in the setting of shortness of breath and another heart failure exacerbation.  She expresses the goals and hopes to get better moving forward.  She shares that her faith will bring her through and that God is capable of doing things  that we could not imagine.  Kara Mullins gets extreme joy out of spending with her 2 grandchildren and her daughter and son in Social worker.   Kara Mullins does recognize that her health has declined over the past few years though she again attests to her faith.  Kara Mullins is established with outpatient related care support through [facility] and to continue to follow throughout discharge.  Discussed the importance of continued conversation with family and their  medical providers regarding overall plan of care and treatment options, ensuring decisions are within the context of the patients values and GOCs.  Decision Maker: Kara Mullins, Kara Mullins (Daughter): [phone] (Mobile)   SUMMARY OF RECOMMENDATIONS   Full code/Full scope of care --> I strongly recommended ongoing conversations about this  Patient realizes her poor health though feels God will guide her through  Appreciate TOC re-instituting OP Palliative support. Kara Mullins is aware and will arrange FU on discharge to create an updated MOST form  PMT will continue to incrementally follow along  Code Status/Advance Care Planning: FULL CODE  Palliative Prophylaxis:  Aspiration, Bowel Regimen, Delirium Protocol, Frequent Pain Assessment, Oral Care, Palliative Wound Care, and Turn Reposition  Additional Recommendations (Limitations, Scope, Preferences): Continue current care  Psycho-social/Spiritual:  Desire for further Chaplaincy support: Patient has a strong Kara Mullins faith Additional Recommendations: Education on CPR/resuscitation   Prognosis: Recurring readmission, multiple chronic disease processes place patient at a high 12 month mortality risk  Discharge Planning: Discharge plan will be to home once medically optimized.  Vitals:   03/24/23 0526 03/24/23 0627  BP: (!) 162/80 (!) 144/80  Pulse:  60  Resp: 14 16  Temp: (!) 94.3 F (34.6 C) (!) 94.8 F (34.9 C)  SpO2: 97% 100%    Intake/Output Summary (Last 24 hours) at 03/24/2023  2130 Last data filed at 03/24/2023 0430 Gross per 24 hour  Intake 423 ml  Output 1775 ml  Net -1352 ml   Last Weight  Most recent update: 03/24/2023  4:30 AM    Weight  93.4 kg (205 lb 14.6 oz)             Gen:  Elderly AA F in NAD HEENT: moist mucous membranes CV: Regular rate and irregular rhythm  PULM:  On RA, breathing is even and nonlabored ABD: soft/nontender  EXT: (+) BLE edema  Neuro: Alert and oriented x3   PPS: 30%   This conversation/these recommendations were discussed with patient primary care team, Dr. Conan Bowens  Billing based on MDM: High  Problems Addressed: One acute or chronic illness or injury that poses a threat to life or bodily function  Amount and/or Complexity of Data: Category 3:Discussion of management or test interpretation with external physician/other qualified health care professional/appropriate source (not separately reported)  Risks: Decision not to resuscitate or to de-escalate care because of poor prognosis ______________________________________________________ Kara Mullins [place] Palliative Medicine Team Team Cell Phone: [phone] Please utilize secure chat with additional questions, if there is no response within 30 minutes please call the above phone number  Palliative Medicine Team providers are available by phone from 7am to 7pm daily and can be reached through the team cell phone.  Should this patient require assistance outside of these hours, please call  the patient's attending physician.

## 2023-03-24 NOTE — Progress Notes (Signed)
Progress Note  Patient Name: Kara Mullins Date of Encounter: 03/24/2023  Primary Cardiologist:   [name], MD   Subjective   Denies SOB.  No pain  Inpatient Medications    Scheduled Meds:  carvedilol  6.25 mg Oral BID WC   Chlorhexidine Gluconate Cloth  6 each Topical Daily   ferrous sulfate  325 mg Oral BID   folic acid  1 mg Oral Daily   furosemide  80 mg Intravenous TID   heparin  5,000 Units Subcutaneous Q8H   hydrALAZINE  25 mg Oral BID   isosorbide mononitrate  30 mg Oral Daily   sevelamer carbonate  800 mg Oral Q supper   sodium bicarbonate  650 mg Oral BID   sodium chloride flush  3 mL Intravenous Q12H   Continuous Infusions:  PRN Meds: acetaminophen **OR** acetaminophen, albuterol, hydrALAZINE, Muscle Rub   Vital Signs    Vitals:   03/24/23 0721 03/24/23 0731 03/24/23 1108 03/24/23 1109  BP: (!) 161/82 (!) 161/82  (!) 152/72  Pulse: 65 63 (!) 56 (!) 56  Resp: 13   11  Temp: (!) 97.3 F (36.3 C)   (!) 97.3 F (36.3 C)  TempSrc: Oral   Oral  SpO2: 99%  98% 98%  Weight:      Height:        Intake/Output Summary (Last 24 hours) at 03/24/2023 1219 Last data filed at 03/24/2023 1050 Gross per 24 hour  Intake 583 ml  Output 1250 ml  Net -667 ml   Filed Weights   03/22/23 0032 03/23/23 0427 03/24/23 0428  Weight: 89 kg 89.2 kg 93.4 kg    Telemetry    Sinus bradycardia, NSR - Personally Reviewed  ECG    NA - Personally Reviewed  Physical Exam   GEN: No acute distress.   Neck:    Positive  JVD Cardiac: RRR, no murmurs, rubs, or gallops.  Respiratory: Clear  to auscultation bilaterally. GI: Soft, nontender, non-distended  MS:    Severe bilateral leg edema; No deformity. Neuro:  Nonfocal  Psych: Normal affect   Labs    Chemistry Recent Labs  Lab 03/22/23 0050 03/23/23 0102 03/23/23 0105 03/24/23 0050  NA 137  --  136 135  K 4.1  --  3.6 3.5  CL 105  --  106 104  CO2 21*  --  22 21*  GLUCOSE 80  --  66* 79   BUN 78*  --  76* 76*  CREATININE 3.88*  --  3.83* 4.00*  CALCIUM 7.1*  --  6.7* 6.7*  PROT  --  5.3*  --  5.3*  ALBUMIN  --  1.8*  --  1.8*  AST  --  23  --  26  ALT  --  14  --  14  ALKPHOS  --  73  --  68  BILITOT  --  0.4  --  0.4  GFRNONAA 12*  --  12* 11*  ANIONGAP 11  --  8 10     Hematology Recent Labs  Lab 03/22/23 0050 03/23/23 0105 03/24/23 0050  WBC 3.1* 3.4* 2.9*  RBC 4.02 3.26* 3.03*  HGB 11.2* 9.0* 8.4*  HCT 36.3 29.5* 28.0*  MCV 90.3 90.5 92.4  MCH 27.9 27.6 27.7  MCHC 30.9 30.5 30.0  RDW 16.7* 16.7* 16.6*  PLT 171 132* 121*    Cardiac EnzymesNo results for input(s): "TROPONINI" in the last 168 hours. No results for input(s): "TROPIPOC" in  the last 168 hours.   BNP Recent Labs  Lab 03/22/23 0050  BNP 715.1*     DDimer No results for input(s): "DDIMER" in the last 168 hours.   Radiology    ECHOCARDIOGRAM LIMITED  Result Date: 03/23/2023    ECHOCARDIOGRAM LIMITED REPORT   Patient Name:   Kara Mullins Date of Exam: 03/23/2023 Medical Rec #:  [id number]      Height:       65.0 in Accession #:    [id number]     Weight:       196.6 lb Date of Birth:  [birth year]     BSA:          1.964 m Patient Age:    71 years       BP:           139/63 mmHg Patient Gender: F              HR:           66 bpm. Exam Location:  Inpatient Procedure: Strain Analysis, Limited Color Doppler and Limited Echo Indications:     CHF-Acute Systolic I50.21  History:         Patient has prior history of Echocardiogram examinations, most                  recent 02/26/2023. CHF, Arrythmias:Atrial Fibrillation,                  Signs/Symptoms:Hypotension; Risk Factors:Hypertension and                  Dyslipidemia. CKD.  Sonographer:     [name] Referring Phys:  [id number] Ronnald Ramp O'NEAL Diagnosing Phys: Dina Rich MD IMPRESSIONS  1. Left ventricular ejection fraction, by estimation, is 45 to 50%. The left ventricle has mildly decreased function. The left ventricle demonstrates  global hypokinesis.  2. Right ventricular systolic function is normal. The right ventricular size is normal. There is moderately elevated pulmonary artery systolic pressure.  3. Large pleural effusion in the left lateral region.  4. Mild mitral valve regurgitation.  5. The tricuspid valve is abnormal. Tricuspid valve regurgitation is mild.  6. The aortic valve is bicuspid. There is mild calcification of the aortic valve. There is mild thickening of the aortic valve. Aortic valve regurgitation is mild.  7. The inferior vena cava is normal in size with <50% respiratory variability, suggesting right atrial pressure of 8 mmHg.  8. Limited echo to evaluate LV function FINDINGS  Left Ventricle: Left ventricular ejection fraction, by estimation, is 45 to 50%. The left ventricle has mildly decreased function. The left ventricle demonstrates global hypokinesis. The left ventricular internal cavity size was normal in size. There is  no left ventricular hypertrophy. Right Ventricle: The right ventricular size is normal. Right vetricular wall thickness was not well visualized. Right ventricular systolic function is normal. There is moderately elevated pulmonary artery systolic pressure. The tricuspid regurgitant velocity is 3.47 m/s, and with an assumed right atrial pressure of 8 mmHg, the estimated right ventricular systolic pressure is 56.2 mmHg. Mitral Valve: There is mild thickening of the mitral valve leaflet(s). There is mild calcification of the mitral valve leaflet(s). Mild mitral annular calcification. Mild mitral valve regurgitation. Tricuspid Valve: The tricuspid valve is abnormal. Tricuspid valve regurgitation is mild . No evidence of tricuspid stenosis. Aortic Valve: The aortic valve is bicuspid. There is mild calcification of the aortic valve. There is mild thickening of the aortic valve. Aortic  valve regurgitation is mild. Aorta: The aortic root and ascending aorta are structurally normal, with no evidence of  dilitation. Venous: The inferior vena cava is normal in size with less than 50% respiratory variability, suggesting right atrial pressure of 8 mmHg. Additional Comments: There is a large pleural effusion in the left lateral region.  LEFT VENTRICLE PLAX 2D LVIDd:         5.50 cm LVIDs:         4.00 cm LV PW:         0.90 cm LV IVS:        0.90 cm LVOT diam:     1.90 cm LVOT Area:     2.84 cm  IVC IVC diam: 1.60 cm LEFT ATRIUM         Index LA diam:    4.30 cm 2.19 cm/m   AORTA Ao Root diam: 3.00 cm Ao Asc diam:  3.50 cm TRICUSPID VALVE TR Peak grad:   48.2 mmHg TR Vmax:        347.00 cm/s  SHUNTS Systemic Diam: 1.90 cm Dina Rich MD Electronically signed by Dina Rich MD Signature Date/Time: 03/23/2023/1:55:08 PM    Final (Updated)    CT CHEST WO CONTRAST  Result Date: 03/23/2023 CLINICAL DATA:  Dyspnea of unclear etiology. EXAM: CT CHEST WITHOUT CONTRAST TECHNIQUE: Multidetector CT imaging of the chest was performed following the standard protocol without IV contrast. RADIATION DOSE REDUCTION: This exam was performed according to the departmental dose-optimization program which includes automated exposure control, adjustment of the mA and/or kV according to patient size and/or use of iterative reconstruction technique. COMPARISON:  03/05/2021 FINDINGS: Cardiovascular: The heart size is enlarged. Aortic atherosclerosis. Coronary artery calcifications. No para scratch set no pericardial effusion. Mediastinum/Nodes: Thyroid gland, trachea appear normal. Patulous thoracic esophagus identified which may reflect chronic dysmotility. No enlarged mediastinal or axillary adenopathy. Lungs/Pleura: Moderate bilateral pleural effusions are identified, right greater than left. There is associated partial atelectasis involving bilateral lower lung zones with volume loss. Atelectasis versus consolidation is noted within the inferolateral left upper lobe and lingula, image 77/4. No pneumothorax identified. No nodule or  mass within the aerated portions of the lungs. Upper Abdomen: No acute abnormality. Musculoskeletal: No chest wall mass or suspicious bone lesions identified. There is marked diffuse edema identified throughout the subcutaneous soft tissues of the chest wall. IMPRESSION: 1. Moderate bilateral pleural effusions, right greater than left. There is associated partial atelectasis involving bilateral lower lung zones with volume loss. 2. Atelectasis versus consolidation is noted within the inferolateral left upper lobe and lingula. Correlate for any signs/symptoms of pneumonia. 3. Marked diffuse edema identified throughout the subcutaneous soft tissues of the chest wall. 4. Cardiomegaly and coronary artery calcifications. 5. Patulous thoracic esophagus which may reflect chronic dysmotility. 6.  Aortic Atherosclerosis (ICD10-I70.0). Electronically Signed   By: Signa Kell M.D.   On: 03/23/2023 11:24    Cardiac Studies    Echo:    1. Left ventricular ejection fraction, by estimation, is 45 to 50%. The  left ventricle has mildly decreased function. The left ventricle  demonstrates global hypokinesis.   2. Right ventricular systolic function is normal. The right ventricular  size is normal. There is moderately elevated pulmonary artery systolic  pressure.   3. Large pleural effusion in the left lateral region.   4. Mild mitral valve regurgitation.   5. The tricuspid valve is abnormal. Tricuspid valve regurgitation is  mild.   6. The aortic valve is bicuspid. There  is mild calcification of the  aortic valve. There is mild thickening of the aortic valve. Aortic valve  regurgitation is mild.   7. The inferior vena cava is normal in size with <50% respiratory  variability, suggesting right atrial pressure of 8 mmHg.   8. Limited echo to evaluate LV function   Patient Profile     72 y.o. female  with a hx of hypertension, hyperlipidemia, chronic lymphedema, CKD stage IV and chronic combined systolic  and diastolic heart failure with improved EF who was seen 03/22/2023 for the evaluation of CHF at the request of Dr. Katrinka Blazing.   Assessment & Plan    Chronic diastolic heart failure:   There are pleural effusions moderate sized on CT and diffuse subcutaneous edema.  Known to have chronic lymphedema.   Can continue IV diuresis at current rate.    EF might be slightly lower this admission but not clinically relevant.     Hypertension:   BP is not controlled.  Will up titrate beta blocker and Imdur     Prolonged QT:  I will repeat an EKG.  Need to avoid QT prolonging meds.     Chronic lymphedema: Patient has been having leg edema for the past 2 years.   CKD stage IV:  Per primary care.   She does not want dialysis.    Goals of therapy:  Palliative care consult noted.   Creat is elevated and stable.    For questions or updates, please contact [facility] Please consult [email or web address] for contact info under Cardiology/STEMI.   Signed, Rollene Rotunda, MD  03/24/2023, 12:19 PM

## 2023-03-24 NOTE — TOC Initial Note (Addendum)
Transition of Care [facility]) - Initial/Assessment Note    Patient Details  Name: Kara Mullins MRN: [id number] Date of Birth: 08-Feb-1951  [facility] CM/SW Contact:    Kara Haven, RN Phone Number: 03/24/2023, 2:38 PM  Clinical Narrative:                 FRom home with her daughter, Kara Mullins [facility]).  She has PCP, Dr. Elam Dutch, with [facility], she has hospital bed, [name], a walker, and a w/chair at home per daughter.  She is active with [facility] for [facility], HHPT, will add HHOT.  Kara Mullins, her daughter will transport her home at dc.  Her support system is Kara Mullins, patient's spouse, Kara Mullins.  She get her medications from [facility] on  [address] , she states she needs a new gel mattress for patient's hospital bed.  She has a scale and a bp cuff, she eats a low sodium diet.  [facility] follows her also,  Palliative has been consulted.  Per Staff RN she has a stage one on sacral area. She has chronic foley.  Will need resumption orders for [facility]. NCM made referral to Kara Mullins with [facility] for the gel overlay mattress , he will be contacting Kara Mullins for delivery.    Expected Discharge Plan: Home w Home Health Services Barriers to Discharge: Continued Medical Work up   Patient Goals and CMS Choice   CMS Medicare.gov Compare Post Acute Care list provided to:: Patient Represenative (must comment) Choice offered to / list presented to : Adult Children      Expected Discharge Plan and Services In-house Referral: NA Discharge Planning Services: CM Consult Post Acute Care Choice: Home Health, Resumption of Svcs/PTA Provider Living arrangements for the past 2 months: Single Family Home                 DME Arranged: N/A DME Agency: NA       HH Arranged: RN, PT, OT HH Agency: [facility] Date HH Agency Contacted: 03/24/23 Time HH Agency Contacted: 1435 Representative spoke with [facility] Agency: Kara Mullins  Prior Living Arrangements/Services Living  arrangements for the past 2 months: Single Family Home Lives with:: Adult Children, Relatives Patient language and need for interpreter reviewed:: Yes Do you feel safe going back to the place where you live?: Yes      Need for Family Participation in Patient Care: Yes (Comment) Care giver support system in place?: Yes (comment) Current home services: Home PT, Home RN (hosp bed, bsc, walker, w/chair,) Criminal Activity/Legal Involvement Pertinent to Current Situation/Hospitalization: No - Comment as needed  Activities of Daily Living Home Assistive Devices/Equipment: Bedside commode/3-in-1 ADL Screening (condition at time of admission) Patient's cognitive ability adequate to safely complete daily activities?: Yes Is the patient deaf or have difficulty hearing?: No Does the patient have difficulty seeing, even when wearing glasses/contacts?: No Does the patient have difficulty concentrating, remembering, or making decisions?: No Patient able to express need for assistance with ADLs?: Yes Does the patient have difficulty dressing or bathing?: No Independently performs ADLs?: No Communication: Independent Dressing (OT): Needs assistance Is this a change from baseline?: Pre-admission baseline Grooming: Appropriate for developmental age Feeding: Independent Bathing: Needs assistance Is this a change from baseline?: Pre-admission baseline Toileting: Needs assistance Is this a change from baseline?: Pre-admission baseline In/Out Bed: Needs assistance Is this a change from baseline?: Pre-admission baseline Walks in Home: Dependent Is this a change from baseline?: Pre-admission baseline Does the patient have  difficulty walking or climbing stairs?: Yes Weakness of Legs: Both Weakness of Arms/Hands: None  Permission Sought/Granted Permission sought to share information with : Family Supports, Case Production designer, theatre/television/film, Photographer granted to share info w AGENCY:  [facility]        Emotional Assessment Appearance:: Appears stated age Attitude/Demeanor/Rapport:  (Appropriate) Affect (typically observed):  (Appropriate) Orientation: : Oriented to Self, Oriented to Place, Oriented to  Time, Oriented to Situation Alcohol / Substance Use: Not Applicable Psych Involvement: No (comment)  Admission diagnosis:  Acute right-sided congestive heart failure (HCC) [I50.811] Anasarca [R60.1] Long QT interval [R94.31] Acute on chronic diastolic (congestive) heart failure (HCC) [I50.33] Patient Active Problem List   Diagnosis Date Noted   Acute on chronic diastolic (congestive) heart failure (HCC) 03/22/2023   Anemia of chronic disease 03/22/2023   Sacral wound 03/22/2023   Acute on chronic diastolic CHF (congestive heart failure) (HCC) 02/26/2023   Cardiorenal syndrome 02/25/2023   Chronic kidney disease (CKD), stage IV (severe) (HCC) 02/25/2023   Hypoglycemia 08/15/2022   Hypertensive emergency 08/15/2022   Obesity (BMI 30-39.9) 08/15/2022   Acute on chronic diastolic heart failure (HCC) 08/14/2022   AKI (acute kidney injury) (HCC)    (HFpEF) heart failure with preserved ejection fraction (HCC) 06/02/2022   Acute respiratory distress 06/02/2022   Bilateral lower extremity edema 06/02/2022   Hypocalcemia 06/02/2022   Subclinical hypothyroidism 06/02/2022   Transaminitis 06/02/2022   Acute kidney injury superimposed on chronic kidney disease (HCC) 03/28/2022   Anasarca associated with disorder of kidney 03/27/2022   Leukopenia 03/27/2022   Aortic atherosclerosis (HCC) 03/27/2022   Pressure injury of skin 01/02/2022   Hypotension, unspecified 12/30/2021   Pneumonia due to COVID-19 virus 12/26/2021   Palliative care by specialist    Goals of care, counseling/discussion    General weakness    Sepsis (HCC) 12/22/2021   COVID-19 virus infection 12/22/2021   Paroxysmal atrial fibrillation (HCC) 12/22/2021   CKD (chronic kidney disease) stage 5, GFR  less than 15 ml/min (HCC) 12/22/2021   Normocytic anemia 12/22/2021   Prolonged QT interval 12/22/2021   Debility 11/20/2021   Metabolic acidosis 11/20/2021   Severe sepsis (HCC) 11/19/2021   Hypothermia 11/09/2021   Hyperlipidemia 11/09/2021   Chronic combined systolic and diastolic congestive heart failure (HCC) 11/09/2021   Pancytopenia (HCC) 11/09/2021   Severe protein-calorie malnutrition (HCC) 11/09/2021   Acute combined systolic and diastolic HF (heart failure) (HCC)    CHF exacerbation (HCC) 03/06/2021   Essential hypertension    Hypertensive urgency 03/05/2021   Acute CHF (congestive heart failure) (HCC) 03/05/2021   Nausea and vomiting 03/05/2021   Elevated troponin level not due myocardial infarction 03/05/2021   Elevated d-dimer 03/05/2021   Generalized anxiety disorder 03/05/2021   Acute congestive heart failure (HCC) 03/05/2021   PCP:  Caesar Bookman, NP Pharmacy:   [id number] [place], [place] - [address] AT [facility] OF [facility] & [address] [place] [address] Phone: [phone] Fax: [phone]  [id number] - [place], [place] - [address] AT [facility] [address] [place] [address] Phone: [phone] Fax: [phone]  [id number] [place], [place] - [address] AT [facility] OF [address] & [address] [place] [address] Phone: [phone] Fax: [phone]  [facility] [address] [place] [address] Phone:  [phone] Fax: [phone]     Social Determinants of Health (SDOH) Social History: SDOH Screenings   Food Insecurity: No Food Insecurity (03/22/2023)  Housing: Low Risk  (03/22/2023)  Transportation Needs: No Transportation Needs (03/22/2023)  Utilities: Not At Risk (03/22/2023)  Alcohol Screen: Low Risk  (06/11/2022)  Depression (PHQ2-9): Low Risk  (01/27/2023)  Financial  Resource Strain: Low Risk  (06/11/2022)  Tobacco Use: Low Risk  (03/22/2023)   SDOH Interventions:     Readmission Risk Interventions    03/24/2023    2:26 PM 02/26/2023    2:53 PM 06/17/2022    3:36 PM  Readmission Risk Prevention Plan  Transportation Screening Complete Complete   PCP or Specialist Appt within 3-5 Days Complete    HRI or Home Care Consult Complete Complete   Social Work Consult for Recovery Care Planning/Counseling  Complete   Palliative Care Screening Not Applicable Not Applicable   Medication Review Oceanographer) Complete Referral to Pharmacy Complete  PCP or Specialist appointment within 3-5 days of discharge   Complete  HRI or Home Care Consult   Complete  SW Recovery Care/Counseling Consult   Complete  Palliative Care Screening   Not Applicable  Skilled Nursing Facility   Not Applicable

## 2023-03-24 NOTE — Consult Note (Addendum)
WOC Nurse Consult Note: Reason for Consult: wounds sacrum, R buttock  Wound type: Pressure  Pressure Injury POA: Yes Measurement: 1.  R buttock 3 cm x 3 cm Stage 2 Pressure Injury  100% pink dry  2.  Sacrum Stage 1 Pressure Injury  3 cm x 1 cm   Drainage (amount, consistency, odor) none  Periwound: intact  Dressing procedure/placement/frequency: Clean stage 2 PI and sacrum Stage 1 PI with soap and water, dry and apply silicone foam.  Lift silicone foam daily to assess.  Change silicone foam dressing q3 days and prn soiling.    POC discussed with patient and bedside staff.  WOC team will not follow at this time. Re-consult if further needs arise.   Thank you,    Priscella Mann MSN, RN-BC, [employer]  [phone]

## 2023-03-24 NOTE — Telephone Encounter (Signed)
Rosey Bath with [employer] called stating that they received order for specialty mattress. She just needs to clarify what type of mattress (I.e is it gel overly for hospital bed) She also needs office noted to go with order. The clarification can either be called in to [phone] or new updated order faxed to [phone].  Message sent to [name], NP

## 2023-03-24 NOTE — Telephone Encounter (Signed)
Gel overlay for hospital bed.Verify also with patient's POA

## 2023-03-24 NOTE — Progress Notes (Signed)
Difficulty obtaining pt's temp orally and axillary this morning. Rectal temp 94.12F. Provider notified awaiting reply back. Pt already had an order for warming blanket. Bear hugger that was present in the room applied.   Pt's daughter notified of low temp and intervention via phone. Per daughter, has been an on going issue.

## 2023-03-24 NOTE — Progress Notes (Addendum)
PROGRESS NOTE    Kara Mullins  ZOX:[id number] DOB: [birth year] DOA: 03/22/2023 PCP: Caesar Bookman, NP  71/M with history of hypertension, diastolic CHF, CKD 5, anxiety disorder presented to the ED with worsening shortness of breath and swelling. -Recently hospitalized 5/14-5/19 with CHF exacerbation and AKI/CKD, cardiorenal syndrome, followed by cards and nephrology, diuresed and transition to torsemide 40 Mg twice daily, she also has significant debility and is mostly nonambulatory, bedbound.  Lives with her daughter, compliant with meds, back in the ED with volume overload, hypertensive, BNP 715, hemoglobin 11, creatinine 3.8, chest x-ray with bilateral airspace disease and small to moderate bilateral effusions   Subjective: -Feels weak, breathing marginally better  Assessment and Plan:  Acute on chronic systolic and diastolic CHF Pleural effusion Anasarca -Echo from 5/15 had noted EF 50 -55% with severe LVH, normal RV -Repeat echo with EF down to 45-50% with normal RV and moderately elevated PA systolic pressures -Remains significantly volume overloaded, has 2-3+ edema extending to upper thighs  -CT chest with moderate bilateral pleural pleural effusions -Continue Lasix 80 Mg IV 3 times daily  -Continue carvedilol, hydralazine, Imdur  -GDMT limited by CKD 5  -Palliative consulted for goals of care, prognosis is poor with CKD 5, very poor functional status she declines dialysis to   Leukopenia -Mild, monitor   Prolonged QT interval QTc 511 -Caution with meds   Anemia chronic kidney disease -Hemoglobin 8.4, close to her baseline, monitor   Chronic kidney disease stage V Creatinine 3.88 with BUN 78.  Records noted creatinine only around 4.  Patient does not want to go on hemodialysis. -I worry she is getting closer to needing dialysis however she is nonambulatory, would not be a good candidate -Followed by Dr. Vallery Sa, consult palliative care   Sacral pressure  wound -Air overlay mattress, wound care   Debility Patient is essentially bedbound at baseline.    -PT OT   DVT prophylaxis: Heparin Advanc.e Care Planning:   Code Status: Full Code, recommended consideration of DNR yesterday Family Communication: Daughter at bedside Disposition Plan: To be determined  Consultants: Cards   Procedures:   Antimicrobials:    Objective: Vitals:   03/24/23 0721 03/24/23 0731 03/24/23 1108 03/24/23 1109  BP: (!) 161/82 (!) 161/82  (!) 152/72  Pulse: 65 63 (!) 56 (!) 56  Resp: 13   11  Temp: (!) 97.3 F (36.3 C)   (!) 97.3 F (36.3 C)  TempSrc: Oral   Oral  SpO2: 99%  98% 98%  Weight:      Height:        Intake/Output Summary (Last 24 hours) at 03/24/2023 1131 Last data filed at 03/24/2023 1050 Gross per 24 hour  Intake 583 ml  Output 1250 ml  Net -667 ml   Filed Weights   03/22/23 0032 03/23/23 0427 03/24/23 0428  Weight: 89 kg 89.2 kg 93.4 kg    Examination:  General exam: Chronically ill female sitting up in bed, AAOx3 HEENT: Positive JVD CVS: S1-S2, regular rhythm Lungs: Decreased breath sounds to bases Abdomen: Soft, nontender, bowel sounds present These: 2-3+ edema extending to upper thighs   Data Reviewed:   CBC: Recent Labs  Lab 03/22/23 0050 03/23/23 0105 03/24/23 0050  WBC 3.1* 3.4* 2.9*  NEUTROABS 1.9  --   --   HGB 11.2* 9.0* 8.4*  HCT 36.3 29.5* 28.0*  MCV 90.3 90.5 92.4  PLT 171 132* 121*   Basic Metabolic Panel: Recent Labs  Lab 03/22/23 0050 03/23/23  0105 03/24/23 0050  NA 137 136 135  K 4.1 3.6 3.5  CL 105 106 104  CO2 21* 22 21*  GLUCOSE 80 66* 79  BUN 78* 76* 76*  CREATININE 3.88* 3.83* 4.00*  CALCIUM 7.1* 6.7* 6.7*   GFR: Estimated Creatinine Clearance: 14.8 mL/min (A) (by C-G formula based on SCr of 4 mg/dL (H)). Liver Function Tests: Recent Labs  Lab 03/23/23 0102 03/24/23 0050  AST 23 26  ALT 14 14  ALKPHOS 73 68  BILITOT 0.4 0.4  PROT 5.3* 5.3*  ALBUMIN 1.8* 1.8*   No  results for input(s): "LIPASE", "AMYLASE" in the last 168 hours. No results for input(s): "AMMONIA" in the last 168 hours. Coagulation Profile: No results for input(s): "INR", "PROTIME" in the last 168 hours. Cardiac Enzymes: No results for input(s): "CKTOTAL", "CKMB", "CKMBINDEX", "TROPONINI" in the last 168 hours. BNP (last 3 results) No results for input(s): "PROBNP" in the last 8760 hours. HbA1C: No results for input(s): "HGBA1C" in the last 72 hours. CBG: No results for input(s): "GLUCAP" in the last 168 hours. Lipid Profile: No results for input(s): "CHOL", "HDL", "LDLCALC", "TRIG", "CHOLHDL", "LDLDIRECT" in the last 72 hours. Thyroid Function Tests: Recent Labs    03/24/23 0803  TSH 2.488   Anemia Panel: No results for input(s): "VITAMINB12", "FOLATE", "FERRITIN", "TIBC", "IRON", "RETICCTPCT" in the last 72 hours. Urine analysis:    Component Value Date/Time   COLORURINE YELLOW (A) 10/04/2022 1151   APPEARANCEUR TURBID (A) 10/04/2022 1151   LABSPEC 1.012 10/04/2022 1151   PHURINE 8.0 10/04/2022 1151   GLUCOSEU NEGATIVE 10/04/2022 1151   HGBUR NEGATIVE 10/04/2022 1151   BILIRUBINUR Negative 12/05/2022 1454   KETONESUR NEGATIVE 10/04/2022 1151   PROTEINUR Positive (A) 12/05/2022 1454   PROTEINUR >=300 (A) 10/04/2022 1151   UROBILINOGEN 0.2 12/05/2022 1454   UROBILINOGEN 0.2 11/19/2019 2004   NITRITE Negative 12/05/2022 1454   NITRITE NEGATIVE 10/04/2022 1151   LEUKOCYTESUR Moderate (2+) (A) 12/05/2022 1454   LEUKOCYTESUR LARGE (A) 10/04/2022 1151   Sepsis Labs: @LABRCNTIP (procalcitonin:4,lacticidven:4)  )No results found for this or any previous visit (from the past 240 hour(s)).   Radiology Studies: ECHOCARDIOGRAM LIMITED  Result Date: 03/23/2023    ECHOCARDIOGRAM LIMITED REPORT   Patient Name:   Kara Mullins Date of Exam: 03/23/2023 Medical Rec #:  [id number]      Height:       65.0 in Accession #:    [id number]     Weight:       196.6 lb Date of Birth:   1951/03/14     BSA:          1.964 m Patient Age:    71 years       BP:           139/63 mmHg Patient Gender: F              HR:           66 bpm. Exam Location:  Inpatient Procedure: Strain Analysis, Limited Color Doppler and Limited Echo Indications:     CHF-Acute Systolic I50.21  History:         Patient has prior history of Echocardiogram examinations, most                  recent 02/26/2023. CHF, Arrythmias:Atrial Fibrillation,                  Signs/Symptoms:Hypotension; Risk Factors:Hypertension and  Dyslipidemia. CKD.  Sonographer:     [name] Referring Phys:  [id number] Ronnald Ramp O'NEAL Diagnosing Phys: Dina Rich MD IMPRESSIONS  1. Left ventricular ejection fraction, by estimation, is 45 to 50%. The left ventricle has mildly decreased function. The left ventricle demonstrates global hypokinesis.  2. Right ventricular systolic function is normal. The right ventricular size is normal. There is moderately elevated pulmonary artery systolic pressure.  3. Large pleural effusion in the left lateral region.  4. Mild mitral valve regurgitation.  5. The tricuspid valve is abnormal. Tricuspid valve regurgitation is mild.  6. The aortic valve is bicuspid. There is mild calcification of the aortic valve. There is mild thickening of the aortic valve. Aortic valve regurgitation is mild.  7. The inferior vena cava is normal in size with <50% respiratory variability, suggesting right atrial pressure of 8 mmHg.  8. Limited echo to evaluate LV function FINDINGS  Left Ventricle: Left ventricular ejection fraction, by estimation, is 45 to 50%. The left ventricle has mildly decreased function. The left ventricle demonstrates global hypokinesis. The left ventricular internal cavity size was normal in size. There is  no left ventricular hypertrophy. Right Ventricle: The right ventricular size is normal. Right vetricular wall thickness was not well visualized. Right ventricular systolic function is  normal. There is moderately elevated pulmonary artery systolic pressure. The tricuspid regurgitant velocity is 3.47 m/s, and with an assumed right atrial pressure of 8 mmHg, the estimated right ventricular systolic pressure is 56.2 mmHg. Mitral Valve: There is mild thickening of the mitral valve leaflet(s). There is mild calcification of the mitral valve leaflet(s). Mild mitral annular calcification. Mild mitral valve regurgitation. Tricuspid Valve: The tricuspid valve is abnormal. Tricuspid valve regurgitation is mild . No evidence of tricuspid stenosis. Aortic Valve: The aortic valve is bicuspid. There is mild calcification of the aortic valve. There is mild thickening of the aortic valve. Aortic valve regurgitation is mild. Aorta: The aortic root and ascending aorta are structurally normal, with no evidence of dilitation. Venous: The inferior vena cava is normal in size with less than 50% respiratory variability, suggesting right atrial pressure of 8 mmHg. Additional Comments: There is a large pleural effusion in the left lateral region.  LEFT VENTRICLE PLAX 2D LVIDd:         5.50 cm LVIDs:         4.00 cm LV PW:         0.90 cm LV IVS:        0.90 cm LVOT diam:     1.90 cm LVOT Area:     2.84 cm  IVC IVC diam: 1.60 cm LEFT ATRIUM         Index LA diam:    4.30 cm 2.19 cm/m   AORTA Ao Root diam: 3.00 cm Ao Asc diam:  3.50 cm TRICUSPID VALVE TR Peak grad:   48.2 mmHg TR Vmax:        347.00 cm/s  SHUNTS Systemic Diam: 1.90 cm Dina Rich MD Electronically signed by Dina Rich MD Signature Date/Time: 03/23/2023/1:55:08 PM    Final (Updated)    CT CHEST WO CONTRAST  Result Date: 03/23/2023 CLINICAL DATA:  Dyspnea of unclear etiology. EXAM: CT CHEST WITHOUT CONTRAST TECHNIQUE: Multidetector CT imaging of the chest was performed following the standard protocol without IV contrast. RADIATION DOSE REDUCTION: This exam was performed according to the departmental dose-optimization program which includes  automated exposure control, adjustment of the mA and/or kV according to patient size and/or use  of iterative reconstruction technique. COMPARISON:  03/05/2021 FINDINGS: Cardiovascular: The heart size is enlarged. Aortic atherosclerosis. Coronary artery calcifications. No para scratch set no pericardial effusion. Mediastinum/Nodes: Thyroid gland, trachea appear normal. Patulous thoracic esophagus identified which may reflect chronic dysmotility. No enlarged mediastinal or axillary adenopathy. Lungs/Pleura: Moderate bilateral pleural effusions are identified, right greater than left. There is associated partial atelectasis involving bilateral lower lung zones with volume loss. Atelectasis versus consolidation is noted within the inferolateral left upper lobe and lingula, image 77/4. No pneumothorax identified. No nodule or mass within the aerated portions of the lungs. Upper Abdomen: No acute abnormality. Musculoskeletal: No chest wall mass or suspicious bone lesions identified. There is marked diffuse edema identified throughout the subcutaneous soft tissues of the chest wall. IMPRESSION: 1. Moderate bilateral pleural effusions, right greater than left. There is associated partial atelectasis involving bilateral lower lung zones with volume loss. 2. Atelectasis versus consolidation is noted within the inferolateral left upper lobe and lingula. Correlate for any signs/symptoms of pneumonia. 3. Marked diffuse edema identified throughout the subcutaneous soft tissues of the chest wall. 4. Cardiomegaly and coronary artery calcifications. 5. Patulous thoracic esophagus which may reflect chronic dysmotility. 6.  Aortic Atherosclerosis (ICD10-I70.0). Electronically Signed   By: Signa Kell M.D.   On: 03/23/2023 11:24     Scheduled Meds:  carvedilol  6.25 mg Oral BID WC   Chlorhexidine Gluconate Cloth  6 each Topical Daily   ferrous sulfate  325 mg Oral BID   folic acid  1 mg Oral Daily   furosemide  80 mg  Intravenous TID   heparin  5,000 Units Subcutaneous Q8H   hydrALAZINE  25 mg Oral BID   isosorbide mononitrate  30 mg Oral Daily   sevelamer carbonate  800 mg Oral Q supper   sodium bicarbonate  650 mg Oral BID   sodium chloride flush  3 mL Intravenous Q12H   Continuous Infusions:   LOS: 2 days    Time spent:    Zannie Cove, MD [facility]   03/24/2023, 11:31 AM

## 2023-03-25 DIAGNOSIS — I5033 Acute on chronic diastolic (congestive) heart failure: Secondary | ICD-10-CM | POA: Diagnosis not present

## 2023-03-25 LAB — IRON AND TIBC
Iron: 41 ug/dL (ref 28–170)
Saturation Ratios: 28 % (ref 10.4–31.8)
TIBC: 144 ug/dL — ABNORMAL LOW (ref 250–450)
UIBC: 103 ug/dL

## 2023-03-25 LAB — CBC
HCT: 29 % — ABNORMAL LOW (ref 36.0–46.0)
Hemoglobin: 8.8 g/dL — ABNORMAL LOW (ref 12.0–15.0)
MCH: 27.6 pg (ref 26.0–34.0)
MCHC: 30.3 g/dL (ref 30.0–36.0)
MCV: 90.9 fL (ref 80.0–100.0)
Platelets: 134 10*3/uL — ABNORMAL LOW (ref 150–400)
RBC: 3.19 MIL/uL — ABNORMAL LOW (ref 3.87–5.11)
RDW: 16.3 % — ABNORMAL HIGH (ref 11.5–15.5)
WBC: 3.4 10*3/uL — ABNORMAL LOW (ref 4.0–10.5)
nRBC: 0 % (ref 0.0–0.2)

## 2023-03-25 LAB — BASIC METABOLIC PANEL
Anion gap: 10 (ref 5–15)
BUN: 77 mg/dL — ABNORMAL HIGH (ref 8–23)
CO2: 23 mmol/L (ref 22–32)
Calcium: 7 mg/dL — ABNORMAL LOW (ref 8.9–10.3)
Chloride: 103 mmol/L (ref 98–111)
Creatinine, Ser: 4.03 mg/dL — ABNORMAL HIGH (ref 0.44–1.00)
GFR, Estimated: 11 mL/min — ABNORMAL LOW (ref >60)
Glucose, Bld: 91 mg/dL (ref 70–99)
Potassium: 4.1 mmol/L (ref 3.5–5.1)
Sodium: 136 mmol/L (ref 135–145)

## 2023-03-25 LAB — MAGNESIUM: Magnesium: 2.6 mg/dL — ABNORMAL HIGH (ref 1.7–2.4)

## 2023-03-25 LAB — FERRITIN: Ferritin: 648 ng/mL — ABNORMAL HIGH (ref 11–307)

## 2023-03-25 MED ORDER — METOLAZONE 5 MG PO TABS
5.0000 mg | ORAL_TABLET | Freq: Every day | ORAL | Status: DC
  Administered 2023-03-25 – 2023-03-26 (×2): 5 mg via ORAL
  Filled 2023-03-25 (×3): qty 1

## 2023-03-25 MED ORDER — HYDRALAZINE HCL 25 MG PO TABS
37.5000 mg | ORAL_TABLET | Freq: Three times a day (TID) | ORAL | Status: DC
  Administered 2023-03-25 – 2023-03-28 (×7): 37.5 mg via ORAL
  Filled 2023-03-25 (×10): qty 2

## 2023-03-25 NOTE — Consult Note (Signed)
Reason for Consult: Volume overload in patient with chronic kidney disease stage V Referring Physician: Zannie Cove, MD [facility])  HPI:  72 year old woman with past medical history significant for progressive chronic kidney disease now to stage V, hypertension, combined diastolic/systolic congestive heart failure, dyslipidemia and anemia on treatment with ESA.  She was readmitted 3 days ago after just a recent hospitalization between 5/14 - 5/19 for exacerbation of diastolic heart failure and worsening renal function.  She presented with worsening shortness of breath and fluctuating lower extremity swelling.  In the past she had expressed that she would not pursue renal replacement therapy and was on a conservative management track under the supervision of Dr. Malen Gauze who followed her at [facility].  She has now changed her mind about pursuing dialysis after offers for transitioning to hospice care and wants to continue all efforts at staying alive longer.  She understands that her challenges with mobility may pose some difficulties and she may have a worsening quality of life but is unwilling to go into hospice care.  Urine output from overnight was unimpressive with reasonably dosed furosemide (80 mg 3 times daily).  Will add metolazone to this regimen and reevaluate response.  If she does not have significant improvement of volume status/shortness of breath, will plan to initiate dialysis during this hospitalization otherwise pursue this track as an outpatient (possibly after inpatient dialysis access placement).  Past Medical History:  Diagnosis Date   Chronic combined systolic and diastolic congestive heart failure (HCC) 11/09/2021   Generalized anxiety disorder    Hyperlipidemia 11/09/2021   Hypertension    Stage 3b chronic kidney disease (CKD) (HCC) 11/09/2021    Past Surgical History:  Procedure Laterality Date   CATARACT EXTRACTION, BILATERAL     combined systolic and  diastolic congestive heart failure       Family History  Problem Relation Age of Onset   Throat cancer Father     Social History:  reports that she has never smoked. She has never used smokeless tobacco. She reports that she does not currently use alcohol. She reports that she does not use drugs.  Allergies:  Allergies  Allergen Reactions   Crestor [Rosuvastatin] Other (See Comments)    "Felt like she couldn't swallow"   Nitrofuran Derivatives Other (See Comments)    Unknown reaction    Medications: I have reviewed the patient's current medications. Scheduled:  carvedilol  6.25 mg Oral BID WC   Chlorhexidine Gluconate Cloth  6 each Topical Daily   ferrous sulfate  325 mg Oral BID   folic acid  1 mg Oral Daily   furosemide  80 mg Intravenous TID   heparin  5,000 Units Subcutaneous Q8H   hydrALAZINE  37.5 mg Oral Q8H   isosorbide mononitrate  120 mg Oral Daily   sevelamer carbonate  800 mg Oral Q supper   sodium bicarbonate  650 mg Oral BID   sodium chloride flush  3 mL Intravenous Q12H      Latest Ref Rng & Units 03/25/2023    1:00 AM 03/24/2023   12:50 AM 03/23/2023    1:05 AM  BMP  Glucose 70 - 99 mg/dL 91  79  66   BUN 8 - 23 mg/dL 77  76  76   Creatinine 0.44 - 1.00 mg/dL 1.61  0.96  0.45   Sodium 135 - 145 mmol/L 136  135  136   Potassium 3.5 - 5.1 mmol/L 4.1  3.5  3.6  Chloride 98 - 111 mmol/L 103  104  106   CO2 22 - 32 mmol/L 23  21  22    Calcium 8.9 - 10.3 mg/dL 7.0  6.7  6.7       Latest Ref Rng & Units 03/25/2023    1:00 AM 03/24/2023   12:50 AM 03/23/2023    1:05 AM  CBC  WBC 4.0 - 10.5 K/uL 3.4  2.9  3.4   Hemoglobin 12.0 - 15.0 g/dL 8.8  8.4  9.0   Hematocrit 36.0 - 46.0 % 29.0  28.0  29.5   Platelets 150 - 400 K/uL 134  121  132    ECHOCARDIOGRAM LIMITED  Result Date: 03/23/2023    ECHOCARDIOGRAM LIMITED REPORT   Patient Name:   Kara Mullins Date of Exam: 03/23/2023 Medical Rec #:  [id number]      Height:       65.0 in Accession #:    [id number]      Weight:       196.6 lb Date of Birth:  03/07/1951     BSA:          1.964 m Patient Age:    71 years       BP:           139/63 mmHg Patient Gender: F              HR:           66 bpm. Exam Location:  Inpatient Procedure: Strain Analysis, Limited Color Doppler and Limited Echo Indications:     CHF-Acute Systolic I50.21  History:         Patient has prior history of Echocardiogram examinations, most                  recent 02/26/2023. CHF, Arrythmias:Atrial Fibrillation,                  Signs/Symptoms:Hypotension; Risk Factors:Hypertension and                  Dyslipidemia. CKD.  Sonographer:     [name] Referring Phys:  [id number] Ronnald Ramp O'NEAL Diagnosing Phys: Dina Rich MD IMPRESSIONS  1. Left ventricular ejection fraction, by estimation, is 45 to 50%. The left ventricle has mildly decreased function. The left ventricle demonstrates global hypokinesis.  2. Right ventricular systolic function is normal. The right ventricular size is normal. There is moderately elevated pulmonary artery systolic pressure.  3. Large pleural effusion in the left lateral region.  4. Mild mitral valve regurgitation.  5. The tricuspid valve is abnormal. Tricuspid valve regurgitation is mild.  6. The aortic valve is bicuspid. There is mild calcification of the aortic valve. There is mild thickening of the aortic valve. Aortic valve regurgitation is mild.  7. The inferior vena cava is normal in size with <50% respiratory variability, suggesting right atrial pressure of 8 mmHg.  8. Limited echo to evaluate LV function FINDINGS  Left Ventricle: Left ventricular ejection fraction, by estimation, is 45 to 50%. The left ventricle has mildly decreased function. The left ventricle demonstrates global hypokinesis. The left ventricular internal cavity size was normal in size. There is  no left ventricular hypertrophy. Right Ventricle: The right ventricular size is normal. Right vetricular wall thickness was not well visualized.  Right ventricular systolic function is normal. There is moderately elevated pulmonary artery systolic pressure. The tricuspid regurgitant velocity is 3.47 m/s, and with an assumed right atrial pressure of 8 mmHg, the  estimated right ventricular systolic pressure is 56.2 mmHg. Mitral Valve: There is mild thickening of the mitral valve leaflet(s). There is mild calcification of the mitral valve leaflet(s). Mild mitral annular calcification. Mild mitral valve regurgitation. Tricuspid Valve: The tricuspid valve is abnormal. Tricuspid valve regurgitation is mild . No evidence of tricuspid stenosis. Aortic Valve: The aortic valve is bicuspid. There is mild calcification of the aortic valve. There is mild thickening of the aortic valve. Aortic valve regurgitation is mild. Aorta: The aortic root and ascending aorta are structurally normal, with no evidence of dilitation. Venous: The inferior vena cava is normal in size with less than 50% respiratory variability, suggesting right atrial pressure of 8 mmHg. Additional Comments: There is a large pleural effusion in the left lateral region.  LEFT VENTRICLE PLAX 2D LVIDd:         5.50 cm LVIDs:         4.00 cm LV PW:         0.90 cm LV IVS:        0.90 cm LVOT diam:     1.90 cm LVOT Area:     2.84 cm  IVC IVC diam: 1.60 cm LEFT ATRIUM         Index LA diam:    4.30 cm 2.19 cm/m   AORTA Ao Root diam: 3.00 cm Ao Asc diam:  3.50 cm TRICUSPID VALVE TR Peak grad:   48.2 mmHg TR Vmax:        347.00 cm/s  SHUNTS Systemic Diam: 1.90 cm Dina Rich MD Electronically signed by Dina Rich MD Signature Date/Time: 03/23/2023/1:55:08 PM    Final (Updated)     Review of Systems  Constitutional:  Positive for appetite change and fatigue. Negative for activity change and fever.  HENT:  Negative for congestion, facial swelling, nosebleeds, sore throat and trouble swallowing.   Eyes:  Negative for redness and visual disturbance.  Respiratory:  Positive for shortness of breath.  Negative for chest tightness and wheezing.   Cardiovascular:  Positive for leg swelling. Negative for chest pain.  Gastrointestinal:  Negative for abdominal pain, blood in stool, diarrhea, nausea and vomiting.  Genitourinary:  Negative for dysuria and hematuria.  Musculoskeletal:  Positive for arthralgias and gait problem. Negative for myalgias.       Chronic right knee  Skin:  Negative for wound.  Neurological:  Positive for weakness.  Psychiatric/Behavioral:  The patient is nervous/anxious.    Blood pressure (!) 171/75, pulse 60, temperature 97.6 F (36.4 C), temperature source Oral, resp. rate 14, height 5\' 5"  (1.651 m), weight 86.9 kg, SpO2 100 %. Physical Exam Constitutional:      General: She is not in acute distress.    Appearance: She is well-developed. She is ill-appearing.  HENT:     Head: Normocephalic and atraumatic.     Mouth/Throat:     Pharynx: Oropharynx is clear.  Eyes:     Extraocular Movements: Extraocular movements intact.  Neck:     Vascular: JVD present.     Comments: JVP to angle of jaw Cardiovascular:     Rate and Rhythm: Regular rhythm. Bradycardia present.     Pulses: Normal pulses.     Heart sounds: Normal heart sounds.     No friction rub.  Pulmonary:     Effort: Pulmonary effort is normal.     Breath sounds: Examination of the right-lower field reveals rales. Examination of the left-lower field reveals decreased breath sounds. Decreased breath sounds and rales present.  Musculoskeletal:     Cervical back: Normal range of motion and neck supple.     Right lower leg: Edema present.     Left lower leg: Edema present.     Comments: 3-4+ pitting edema bilaterally  Skin:    General: Skin is warm and dry.  Neurological:     General: No focal deficit present.     Mental Status: She is alert and oriented to person, place, and time.  Psychiatric:        Mood and Affect: Mood normal.     Assessment/Plan: 1.  Volume overload in patient with chronic  kidney disease stage V: Appears to be clinically improving with corresponding weight reduction seen this morning on the current dose of diuretics.  Will add metolazone 5 mg daily to ongoing diuresis with furosemide 80 mg IV 3 times a day.  If remains diuretic refractory or has worsening renal insufficiency, will pursue dialysis (otherwise will defer this until absolutely required but pursue hemodialysis access at this time).  I discussed with her that I understood her predicament with regards to her apprehension of dying but painted a realistic picture of the difficulties that she will face with her deconditioning/mobility challenges while on dialysis and the likelihood that this may worsen her quality of life.  I informed her that stopping dialysis was always an option in the future.  Significant hypoalbuminemia/malnutrition portends a poor prognosis as a predialysis indicator. 2.  Acute exacerbation of combined systolic/diastolic heart failure: On diuresis with furosemide and appears to be having fair urine output, will augment with metolazone.  Being followed by cardiology. 3.  Anemia: Secondary to chronic kidney disease/chronic illness.  Will check iron studies to decide on intravenous iron supplementation versus redosing with ESA. 4.  Hypertension: Blood pressures elevated likely in part from current volume status, monitor with diuresis. 5.  Protein calorie malnutrition: Continue efforts at optimizing volume status and nutritional supplementation.  Dagoberto Ligas 03/25/2023, 11:18 AM

## 2023-03-25 NOTE — Progress Notes (Signed)
PROGRESS NOTE    Kara Mullins  ZOX:[id number] DOB: 1951/03/17 DOA: 03/22/2023 PCP: Caesar Bookman, NP  71/M with history of hypertension, diastolic CHF, CKD 5, anxiety disorder presented to the ED with worsening shortness of breath and swelling. -Recently hospitalized 5/14-5/19 with CHF exacerbation and AKI/CKD, cardiorenal syndrome, followed by cards and nephrology, diuresed and transition to torsemide 40 Mg twice daily, she also has significant debility and is mostly nonambulatory, bedbound.  Lives with her daughter, compliant with meds, back in the ED with volume overload, hypertensive, BNP 715, hemoglobin 11, creatinine 3.8, chest x-ray with bilateral airspace disease and small to moderate bilateral effusions   Subjective: -Breathing a little better, poor p.o. intake  Assessment and Plan:  Acute on chronic systolic and diastolic CHF Pleural effusion Anasarca -Echo from 5/15 had noted EF 50 -55% with severe LVH, normal RV -Repeat echo with EF down to 45-50% with normal RV and moderately elevated PA systolic pressures -Remains significantly volume overloaded, has 2-3+ edema extending to upper thighs  -CT chest with moderate bilateral pleural pleural effusions -Remains on IV Lasix 80 Mg 3 times daily, she is only net 1.8 L negative, creatinine stable around 4.0 -Continue carvedilol, hydralazine, Imdur, GDMT limited by CKD 5  -Palliative consulted for goals of care, prognosis is poor with CKD 5, very poor functional status -Will request nephrology input   Leukopenia -Mild, monitor   Prolonged QT interval QTc 511 -Caution with meds   Anemia chronic kidney disease -Hemoglobin 8.4, close to her baseline, monitor   Chronic kidney disease stage V -Stable, creatinine around baseline but with anasarca -I worry she is getting closer to needing dialysis however she is nonambulatory, would not be a good candidate -Followed by Dr. Vallery Sa, palliative consulted, will request  nephrology input today   Sacral pressure wound -Air overlay mattress, wound care   Debility Patient is essentially bedbound at baseline.    -PT OT   DVT prophylaxis: Heparin Advanc.e Care Planning:   Discussed CODE STATUS with the patient today, she is agreeable to DNR Family Communication: Daughter at bedside yesterday Disposition Plan: To be determined  Consultants: Cards   Procedures:   Antimicrobials:    Objective: Vitals:   03/24/23 2347 03/25/23 0414 03/25/23 0719 03/25/23 1138  BP: 131/64 (!) 170/73 (!) 171/75 (!) 186/73  Pulse: 60 61 60 (!) 58  Resp:  12 14 17   Temp:  97.9 F (36.6 C) 97.6 F (36.4 C) 97.7 F (36.5 C)  TempSrc:  Oral Oral Oral  SpO2: 97% 100% 100% 99%  Weight:  86.9 kg    Height:        Intake/Output Summary (Last 24 hours) at 03/25/2023 1148 Last data filed at 03/25/2023 0900 Gross per 24 hour  Intake 290 ml  Output 925 ml  Net -635 ml   Filed Weights   03/23/23 0427 03/24/23 0428 03/25/23 0414  Weight: 89.2 kg 93.4 kg 86.9 kg    Examination:  General exam: Chronically ill female sitting up in bed, AAOx3 HEENT: Positive JVD CVS: S1-S2, regular rhythm Lungs: Decreased breath sounds to bases Abdomen: Soft, nontender, bowel sounds present These: 2-3+ edema extending to upper thighs   Data Reviewed:   CBC: Recent Labs  Lab 03/22/23 0050 03/23/23 0105 03/24/23 0050 03/25/23 0100  WBC 3.1* 3.4* 2.9* 3.4*  NEUTROABS 1.9  --   --   --   HGB 11.2* 9.0* 8.4* 8.8*  HCT 36.3 29.5* 28.0* 29.0*  MCV 90.3 90.5 92.4 90.9  PLT 171 132* 121* 134*   Basic Metabolic Panel: Recent Labs  Lab 03/22/23 0050 03/23/23 0105 03/24/23 0050 03/25/23 0100  NA 137 136 135 136  K 4.1 3.6 3.5 4.1  CL 105 106 104 103  CO2 21* 22 21* 23  GLUCOSE 80 66* 79 91  BUN 78* 76* 76* 77*  CREATININE 3.88* 3.83* 4.00* 4.03*  CALCIUM 7.1* 6.7* 6.7* 7.0*   GFR: Estimated Creatinine Clearance: 14.1 mL/min (A) (by C-G formula based on SCr of 4.03  mg/dL (H)). Liver Function Tests: Recent Labs  Lab 03/23/23 0102 03/24/23 0050  AST 23 26  ALT 14 14  ALKPHOS 73 68  BILITOT 0.4 0.4  PROT 5.3* 5.3*  ALBUMIN 1.8* 1.8*   No results for input(s): "LIPASE", "AMYLASE" in the last 168 hours. No results for input(s): "AMMONIA" in the last 168 hours. Coagulation Profile: No results for input(s): "INR", "PROTIME" in the last 168 hours. Cardiac Enzymes: No results for input(s): "CKTOTAL", "CKMB", "CKMBINDEX", "TROPONINI" in the last 168 hours. BNP (last 3 results) No results for input(s): "PROBNP" in the last 8760 hours. HbA1C: No results for input(s): "HGBA1C" in the last 72 hours. CBG: Recent Labs  Lab 03/24/23 2154  GLUCAP 89   Lipid Profile: No results for input(s): "CHOL", "HDL", "LDLCALC", "TRIG", "CHOLHDL", "LDLDIRECT" in the last 72 hours. Thyroid Function Tests: Recent Labs    03/24/23 0803  TSH 2.488   Anemia Panel: No results for input(s): "VITAMINB12", "FOLATE", "FERRITIN", "TIBC", "IRON", "RETICCTPCT" in the last 72 hours. Urine analysis:    Component Value Date/Time   COLORURINE YELLOW (A) 10/04/2022 1151   APPEARANCEUR TURBID (A) 10/04/2022 1151   LABSPEC 1.012 10/04/2022 1151   PHURINE 8.0 10/04/2022 1151   GLUCOSEU NEGATIVE 10/04/2022 1151   HGBUR NEGATIVE 10/04/2022 1151   BILIRUBINUR Negative 12/05/2022 1454   KETONESUR NEGATIVE 10/04/2022 1151   PROTEINUR Positive (A) 12/05/2022 1454   PROTEINUR >=300 (A) 10/04/2022 1151   UROBILINOGEN 0.2 12/05/2022 1454   UROBILINOGEN 0.2 11/19/2019 2004   NITRITE Negative 12/05/2022 1454   NITRITE NEGATIVE 10/04/2022 1151   LEUKOCYTESUR Moderate (2+) (A) 12/05/2022 1454   LEUKOCYTESUR LARGE (A) 10/04/2022 1151   Sepsis Labs: @LABRCNTIP (procalcitonin:4,lacticidven:4)  )No results found for this or any previous visit (from the past 240 hour(s)).   Radiology Studies: ECHOCARDIOGRAM LIMITED  Result Date: 03/23/2023    ECHOCARDIOGRAM LIMITED REPORT   Patient  Name:   Kara Mullins Date of Exam: 03/23/2023 Medical Rec #:  [id number]      Height:       65.0 in Accession #:    [id number]     Weight:       196.6 lb Date of Birth:  11/17/1950     BSA:          1.964 m Patient Age:    71 years       BP:           139/63 mmHg Patient Gender: F              HR:           66 bpm. Exam Location:  Inpatient Procedure: Strain Analysis, Limited Color Doppler and Limited Echo Indications:     CHF-Acute Systolic I50.21  History:         Patient has prior history of Echocardiogram examinations, most                  recent 02/26/2023. CHF, Arrythmias:Atrial Fibrillation,  Signs/Symptoms:Hypotension; Risk Factors:Hypertension and                  Dyslipidemia. CKD.  Sonographer:     [name] Referring Phys:  [id number] Ronnald Ramp O'NEAL Diagnosing Phys: Dina Rich MD IMPRESSIONS  1. Left ventricular ejection fraction, by estimation, is 45 to 50%. The left ventricle has mildly decreased function. The left ventricle demonstrates global hypokinesis.  2. Right ventricular systolic function is normal. The right ventricular size is normal. There is moderately elevated pulmonary artery systolic pressure.  3. Large pleural effusion in the left lateral region.  4. Mild mitral valve regurgitation.  5. The tricuspid valve is abnormal. Tricuspid valve regurgitation is mild.  6. The aortic valve is bicuspid. There is mild calcification of the aortic valve. There is mild thickening of the aortic valve. Aortic valve regurgitation is mild.  7. The inferior vena cava is normal in size with <50% respiratory variability, suggesting right atrial pressure of 8 mmHg.  8. Limited echo to evaluate LV function FINDINGS  Left Ventricle: Left ventricular ejection fraction, by estimation, is 45 to 50%. The left ventricle has mildly decreased function. The left ventricle demonstrates global hypokinesis. The left ventricular internal cavity size was normal in size. There is  no left  ventricular hypertrophy. Right Ventricle: The right ventricular size is normal. Right vetricular wall thickness was not well visualized. Right ventricular systolic function is normal. There is moderately elevated pulmonary artery systolic pressure. The tricuspid regurgitant velocity is 3.47 m/s, and with an assumed right atrial pressure of 8 mmHg, the estimated right ventricular systolic pressure is 56.2 mmHg. Mitral Valve: There is mild thickening of the mitral valve leaflet(s). There is mild calcification of the mitral valve leaflet(s). Mild mitral annular calcification. Mild mitral valve regurgitation. Tricuspid Valve: The tricuspid valve is abnormal. Tricuspid valve regurgitation is mild . No evidence of tricuspid stenosis. Aortic Valve: The aortic valve is bicuspid. There is mild calcification of the aortic valve. There is mild thickening of the aortic valve. Aortic valve regurgitation is mild. Aorta: The aortic root and ascending aorta are structurally normal, with no evidence of dilitation. Venous: The inferior vena cava is normal in size with less than 50% respiratory variability, suggesting right atrial pressure of 8 mmHg. Additional Comments: There is a large pleural effusion in the left lateral region.  LEFT VENTRICLE PLAX 2D LVIDd:         5.50 cm LVIDs:         4.00 cm LV PW:         0.90 cm LV IVS:        0.90 cm LVOT diam:     1.90 cm LVOT Area:     2.84 cm  IVC IVC diam: 1.60 cm LEFT ATRIUM         Index LA diam:    4.30 cm 2.19 cm/m   AORTA Ao Root diam: 3.00 cm Ao Asc diam:  3.50 cm TRICUSPID VALVE TR Peak grad:   48.2 mmHg TR Vmax:        347.00 cm/s  SHUNTS Systemic Diam: 1.90 cm Dina Rich MD Electronically signed by Dina Rich MD Signature Date/Time: 03/23/2023/1:55:08 PM    Final (Updated)      Scheduled Meds:  carvedilol  6.25 mg Oral BID WC   Chlorhexidine Gluconate Cloth  6 each Topical Daily   ferrous sulfate  325 mg Oral BID   folic acid  1 mg Oral Daily   furosemide  80  mg Intravenous TID   heparin  5,000 Units Subcutaneous Q8H   hydrALAZINE  37.5 mg Oral Q8H   isosorbide mononitrate  120 mg Oral Daily   metolazone  5 mg Oral Daily   sevelamer carbonate  800 mg Oral Q supper   sodium bicarbonate  650 mg Oral BID   sodium chloride flush  3 mL Intravenous Q12H   Continuous Infusions:   LOS: 3 days    Time spent:    Zannie Cove, MD [facility]   03/25/2023, 11:48 AM

## 2023-03-25 NOTE — Plan of Care (Signed)
?  Problem: Education: ?Goal: Ability to demonstrate management of disease process will improve ?Outcome: Progressing ?  ?

## 2023-03-25 NOTE — Care Management Important Message (Signed)
Important Message  Patient Details  Name: Kara Mullins MRN: [id number] Date of Birth: 11/26/1950   Medicare Important Message Given:  Yes     Sherilyn Banker 03/25/2023, 4:27 PM

## 2023-03-25 NOTE — Progress Notes (Signed)
Heart Failure Navigator Progress Note  Assessed for Heart & Vascular TOC clinic readiness.  Patient does not meet criteria due to EF 45-50%, CKD V, Bed bound, Pallaitive Care consult. No HF TOC per Dr. Jomarie Longs. .   Navigator will sign off at this time.   Rhae Hammock, BSN, Scientist, clinical (histocompatibility and immunogenetics) Only

## 2023-03-25 NOTE — Progress Notes (Signed)
Progress Note  Patient Name: Kara Mullins Date of Encounter: 03/25/2023  Primary Cardiologist:   [name], MD   Subjective   Mild leg soreness.  No chest pain.    Inpatient Medications    Scheduled Meds:  carvedilol  6.25 mg Oral BID WC   Chlorhexidine Gluconate Cloth  6 each Topical Daily   ferrous sulfate  325 mg Oral BID   folic acid  1 mg Oral Daily   furosemide  80 mg Intravenous TID   heparin  5,000 Units Subcutaneous Q8H   hydrALAZINE  37.5 mg Oral BID   isosorbide mononitrate  120 mg Oral Daily   sevelamer carbonate  800 mg Oral Q supper   sodium bicarbonate  650 mg Oral BID   sodium chloride flush  3 mL Intravenous Q12H   Continuous Infusions:  PRN Meds: acetaminophen **OR** acetaminophen, albuterol, hydrALAZINE, Muscle Rub   Vital Signs    Vitals:   03/24/23 2001 03/24/23 2347 03/25/23 0414 03/25/23 0719  BP: (!) 170/89 131/64 (!) 170/73 (!) 171/75  Pulse: 60 60 61 60  Resp: 14  12 14   Temp: (!) 97.3 F (36.3 C)  97.9 F (36.6 C) 97.6 F (36.4 C)  TempSrc: Oral  Oral Oral  SpO2: 99% 97% 100% 100%  Weight:   86.9 kg   Height:        Intake/Output Summary (Last 24 hours) at 03/25/2023 0851 Last data filed at 03/25/2023 0400 Gross per 24 hour  Intake 460 ml  Output 1025 ml  Net -565 ml   Filed Weights   03/23/23 0427 03/24/23 0428 03/25/23 0414  Weight: 89.2 kg 93.4 kg 86.9 kg    Telemetry    NSR, PVCs - Personally Reviewed  ECG    NA - Personally Reviewed  Physical Exam   GEN:    Chronically ill appear Neck: No  JVD Cardiac: RRR, no murmurs, rubs, or gallops.  Respiratory: Clear   to auscultation bilaterally. GI: Soft, nontender, non-distended, normal bowel sounds  MS:  Severe thigh edema; No deformity. Neuro:   Nonfocal  Psych: Oriented and appropriate   Labs    Chemistry Recent Labs  Lab 03/23/23 0102 03/23/23 0105 03/24/23 0050 03/25/23 0100  NA  --  136 135 136  K  --  3.6 3.5 4.1  CL  --  106  104 103  CO2  --  22 21* 23  GLUCOSE  --  66* 79 91  BUN  --  76* 76* 77*  CREATININE  --  3.83* 4.00* 4.03*  CALCIUM  --  6.7* 6.7* 7.0*  PROT 5.3*  --  5.3*  --   ALBUMIN 1.8*  --  1.8*  --   AST 23  --  26  --   ALT 14  --  14  --   ALKPHOS 73  --  68  --   BILITOT 0.4  --  0.4  --   GFRNONAA  --  12* 11* 11*  ANIONGAP  --  8 10 10      Hematology Recent Labs  Lab 03/23/23 0105 03/24/23 0050 03/25/23 0100  WBC 3.4* 2.9* 3.4*  RBC 3.26* 3.03* 3.19*  HGB 9.0* 8.4* 8.8*  HCT 29.5* 28.0* 29.0*  MCV 90.5 92.4 90.9  MCH 27.6 27.7 27.6  MCHC 30.5 30.0 30.3  RDW 16.7* 16.6* 16.3*  PLT 132* 121* 134*    Cardiac EnzymesNo results for input(s): "TROPONINI" in the last 168 hours. No  results for input(s): "TROPIPOC" in the last 168 hours.   BNP Recent Labs  Lab 03/22/23 0050  BNP 715.1*     DDimer No results for input(s): "DDIMER" in the last 168 hours.   Radiology    ECHOCARDIOGRAM LIMITED  Result Date: 03/23/2023    ECHOCARDIOGRAM LIMITED REPORT   Patient Name:   Kara Mullins Date of Exam: 03/23/2023 Medical Rec #:  [id number]      Height:       65.0 in Accession #:    [id number]     Weight:       196.6 lb Date of Birth:  [birth year]     BSA:          1.964 m Patient Age:    71 years       BP:           139/63 mmHg Patient Gender: F              HR:           66 bpm. Exam Location:  Inpatient Procedure: Strain Analysis, Limited Color Doppler and Limited Echo Indications:     CHF-Acute Systolic I50.21  History:         Patient has prior history of Echocardiogram examinations, most                  recent 02/26/2023. CHF, Arrythmias:Atrial Fibrillation,                  Signs/Symptoms:Hypotension; Risk Factors:Hypertension and                  Dyslipidemia. CKD.  Sonographer:     [name] Referring Phys:  [id number] Ronnald Ramp O'NEAL Diagnosing Phys: Dina Rich MD IMPRESSIONS  1. Left ventricular ejection fraction, by estimation, is 45 to 50%. The left ventricle has  mildly decreased function. The left ventricle demonstrates global hypokinesis.  2. Right ventricular systolic function is normal. The right ventricular size is normal. There is moderately elevated pulmonary artery systolic pressure.  3. Large pleural effusion in the left lateral region.  4. Mild mitral valve regurgitation.  5. The tricuspid valve is abnormal. Tricuspid valve regurgitation is mild.  6. The aortic valve is bicuspid. There is mild calcification of the aortic valve. There is mild thickening of the aortic valve. Aortic valve regurgitation is mild.  7. The inferior vena cava is normal in size with <50% respiratory variability, suggesting right atrial pressure of 8 mmHg.  8. Limited echo to evaluate LV function FINDINGS  Left Ventricle: Left ventricular ejection fraction, by estimation, is 45 to 50%. The left ventricle has mildly decreased function. The left ventricle demonstrates global hypokinesis. The left ventricular internal cavity size was normal in size. There is  no left ventricular hypertrophy. Right Ventricle: The right ventricular size is normal. Right vetricular wall thickness was not well visualized. Right ventricular systolic function is normal. There is moderately elevated pulmonary artery systolic pressure. The tricuspid regurgitant velocity is 3.47 m/s, and with an assumed right atrial pressure of 8 mmHg, the estimated right ventricular systolic pressure is 56.2 mmHg. Mitral Valve: There is mild thickening of the mitral valve leaflet(s). There is mild calcification of the mitral valve leaflet(s). Mild mitral annular calcification. Mild mitral valve regurgitation. Tricuspid Valve: The tricuspid valve is abnormal. Tricuspid valve regurgitation is mild . No evidence of tricuspid stenosis. Aortic Valve: The aortic valve is bicuspid. There is mild calcification of the aortic valve. There is mild thickening  of the aortic valve. Aortic valve regurgitation is mild. Aorta: The aortic root and  ascending aorta are structurally normal, with no evidence of dilitation. Venous: The inferior vena cava is normal in size with less than 50% respiratory variability, suggesting right atrial pressure of 8 mmHg. Additional Comments: There is a large pleural effusion in the left lateral region.  LEFT VENTRICLE PLAX 2D LVIDd:         5.50 cm LVIDs:         4.00 cm LV PW:         0.90 cm LV IVS:        0.90 cm LVOT diam:     1.90 cm LVOT Area:     2.84 cm  IVC IVC diam: 1.60 cm LEFT ATRIUM         Index LA diam:    4.30 cm 2.19 cm/m   AORTA Ao Root diam: 3.00 cm Ao Asc diam:  3.50 cm TRICUSPID VALVE TR Peak grad:   48.2 mmHg TR Vmax:        347.00 cm/s  SHUNTS Systemic Diam: 1.90 cm Dina Rich MD Electronically signed by Dina Rich MD Signature Date/Time: 03/23/2023/1:55:08 PM    Final (Updated)    CT CHEST WO CONTRAST  Result Date: 03/23/2023 CLINICAL DATA:  Dyspnea of unclear etiology. EXAM: CT CHEST WITHOUT CONTRAST TECHNIQUE: Multidetector CT imaging of the chest was performed following the standard protocol without IV contrast. RADIATION DOSE REDUCTION: This exam was performed according to the departmental dose-optimization program which includes automated exposure control, adjustment of the mA and/or kV according to patient size and/or use of iterative reconstruction technique. COMPARISON:  03/05/2021 FINDINGS: Cardiovascular: The heart size is enlarged. Aortic atherosclerosis. Coronary artery calcifications. No para scratch set no pericardial effusion. Mediastinum/Nodes: Thyroid gland, trachea appear normal. Patulous thoracic esophagus identified which may reflect chronic dysmotility. No enlarged mediastinal or axillary adenopathy. Lungs/Pleura: Moderate bilateral pleural effusions are identified, right greater than left. There is associated partial atelectasis involving bilateral lower lung zones with volume loss. Atelectasis versus consolidation is noted within the inferolateral left upper lobe and  lingula, image 77/4. No pneumothorax identified. No nodule or mass within the aerated portions of the lungs. Upper Abdomen: No acute abnormality. Musculoskeletal: No chest wall mass or suspicious bone lesions identified. There is marked diffuse edema identified throughout the subcutaneous soft tissues of the chest wall. IMPRESSION: 1. Moderate bilateral pleural effusions, right greater than left. There is associated partial atelectasis involving bilateral lower lung zones with volume loss. 2. Atelectasis versus consolidation is noted within the inferolateral left upper lobe and lingula. Correlate for any signs/symptoms of pneumonia. 3. Marked diffuse edema identified throughout the subcutaneous soft tissues of the chest wall. 4. Cardiomegaly and coronary artery calcifications. 5. Patulous thoracic esophagus which may reflect chronic dysmotility. 6.  Aortic Atherosclerosis (ICD10-I70.0). Electronically Signed   By: Signa Kell M.D.   On: 03/23/2023 11:24    Cardiac Studies    Echo:    1. Left ventricular ejection fraction, by estimation, is 45 to 50%. The  left ventricle has mildly decreased function. The left ventricle  demonstrates global hypokinesis.   2. Right ventricular systolic function is normal. The right ventricular  size is normal. There is moderately elevated pulmonary artery systolic  pressure.   3. Large pleural effusion in the left lateral region.   4. Mild mitral valve regurgitation.   5. The tricuspid valve is abnormal. Tricuspid valve regurgitation is  mild.   6. The  aortic valve is bicuspid. There is mild calcification of the  aortic valve. There is mild thickening of the aortic valve. Aortic valve  regurgitation is mild.   7. The inferior vena cava is normal in size with <50% respiratory  variability, suggesting right atrial pressure of 8 mmHg.   8. Limited echo to evaluate LV function   Patient Profile     72 y.o. female  with a hx of hypertension, hyperlipidemia,  chronic lymphedema, CKD stage IV and chronic combined systolic and diastolic heart failure with improved EF who was seen 03/22/2023 for the evaluation of CHF at the request of Dr. Katrinka Blazing.   Assessment & Plan    Chronic diastolic heart failure:   Intake and output is not complete.    Continue current IV diuresis.  Down three pounds.    Hypertension:   BP is not controlled.  Titrated hydralazine and nitrates yesterday.  Will change to tid.      Prolonged QT:   QT improved.     Chronic lymphedema:   Would benefit eventually from out patient PT   CKD stage IV:  Per primary care.   She wants to consider dialysis.  Creat is increasing.      Goals of therapy:  Palliative care consult noted.  Unlikely to get meaningful volume removal without dialysis.    For questions or updates, please contact [facility] Please consult [email or web address] for contact info under Cardiology/STEMI.   Signed, Rollene Rotunda, MD  03/25/2023, 8:51 AM

## 2023-03-26 ENCOUNTER — Inpatient Hospital Stay (HOSPITAL_COMMUNITY): Payer: Medicare (Managed Care)

## 2023-03-26 DIAGNOSIS — N185 Chronic kidney disease, stage 5: Secondary | ICD-10-CM

## 2023-03-26 DIAGNOSIS — I5033 Acute on chronic diastolic (congestive) heart failure: Secondary | ICD-10-CM | POA: Diagnosis not present

## 2023-03-26 LAB — RENAL FUNCTION PANEL
Albumin: 1.9 g/dL — ABNORMAL LOW (ref 3.5–5.0)
Anion gap: 9 (ref 5–15)
BUN: 76 mg/dL — ABNORMAL HIGH (ref 8–23)
CO2: 24 mmol/L (ref 22–32)
Calcium: 7 mg/dL — ABNORMAL LOW (ref 8.9–10.3)
Chloride: 105 mmol/L (ref 98–111)
Creatinine, Ser: 4 mg/dL — ABNORMAL HIGH (ref 0.44–1.00)
GFR, Estimated: 11 mL/min — ABNORMAL LOW (ref >60)
Glucose, Bld: 80 mg/dL (ref 70–99)
Phosphorus: 6.8 mg/dL — ABNORMAL HIGH (ref 2.5–4.6)
Potassium: 4.1 mmol/L (ref 3.5–5.1)
Sodium: 138 mmol/L (ref 135–145)

## 2023-03-26 MED ORDER — SODIUM CHLORIDE 0.9 % IV SOLN
250.0000 mg | Freq: Once | INTRAVENOUS | Status: AC
  Administered 2023-03-26: 250 mg via INTRAVENOUS
  Filled 2023-03-26: qty 20

## 2023-03-26 MED ORDER — DARBEPOETIN ALFA 100 MCG/0.5ML IJ SOSY
100.0000 ug | PREFILLED_SYRINGE | INTRAMUSCULAR | Status: DC
  Administered 2023-03-26: 100 ug via SUBCUTANEOUS
  Filled 2023-03-26: qty 0.5

## 2023-03-26 MED ORDER — SEVELAMER CARBONATE 800 MG PO TABS
1600.0000 mg | ORAL_TABLET | Freq: Three times a day (TID) | ORAL | Status: DC
  Administered 2023-03-26 – 2023-04-01 (×12): 1600 mg via ORAL
  Filled 2023-03-26 (×16): qty 2

## 2023-03-26 NOTE — Progress Notes (Signed)
Patient ID: Kara Mullins, female   DOB: Oct 13, 1951, 72 y.o.   MRN: [id number] [facility] Progress Note   Assessment/ Plan:   1.  Volume overload in patient with chronic kidney disease stage V: Unimpressive response to diuretics with net negative fluid balance however unchanged weight noted overnight.  Clinically, she reports that she is doing better with near resolution of her shortness of breath and improvement of tightness in her legs.  Yesterday added metolazone to furosemide and the plan is to review her response to this over the next 24 hours.  I will obtain upper extremity vein mapping today and consult vascular surgery with assistance with dialysis access planning. 2.  Acute exacerbation of combined systolic/diastolic heart failure: With fair urine output and unchanged weight overnight.  Continue metolazone and furosemide. 3.  Anemia: Secondary to chronic disease including chronic kidney disease-iron stores close to replete-will supplement with Ferrlecit 250 mg and begin ESA today. 4.  Hypertension: Blood pressure marginally elevated and permissive to current escalation of diuresis.  Subjective:   Reports to be feeling fair and reports some improvement of her shortness of breath.   Objective:   BP (!) 148/63 (BP Location: Right Arm)   Pulse (!) 57   Temp (!) 97.5 F (36.4 C) (Other (Comment))   Resp 16   Ht 5\' 5"  (1.651 m)   Wt 86.9 kg   SpO2 100%   BMI 31.88 kg/m   Intake/Output Summary (Last 24 hours) at 03/26/2023 0752 Last data filed at 03/26/2023 0523 Gross per 24 hour  Intake 50 ml  Output 1545 ml  Net -1495 ml   Weight change: 0 kg  Physical Exam: Gen: Appears comfortable resting in bed, daughter at bedside helping to reposition her CVS: Pulse regular rhythm, normal rate, S1 and S2 normal Resp: Diminished breath sounds over bases with fine rales right side.  No rhonchi Abd: Soft, obese, nontender, bowel sounds normal Ext: 3+ pitting edema  bilaterally  Imaging: No results found.  Labs: BMET Recent Labs  Lab 03/22/23 0050 03/23/23 0105 03/24/23 0050 03/25/23 0100 03/26/23 0012  NA 137 136 135 136 138  K 4.1 3.6 3.5 4.1 4.1  CL 105 106 104 103 105  CO2 21* 22 21* 23 24  GLUCOSE 80 66* 79 91 80  BUN 78* 76* 76* 77* 76*  CREATININE 3.88* 3.83* 4.00* 4.03* 4.00*  CALCIUM 7.1* 6.7* 6.7* 7.0* 7.0*  PHOS  --   --   --   --  6.8*   CBC Recent Labs  Lab 03/22/23 0050 03/23/23 0105 03/24/23 0050 03/25/23 0100  WBC 3.1* 3.4* 2.9* 3.4*  NEUTROABS 1.9  --   --   --   HGB 11.2* 9.0* 8.4* 8.8*  HCT 36.3 29.5* 28.0* 29.0*  MCV 90.3 90.5 92.4 90.9  PLT 171 132* 121* 134*    Medications:     carvedilol  6.25 mg Oral BID WC   Chlorhexidine Gluconate Cloth  6 each Topical Daily   ferrous sulfate  325 mg Oral BID   folic acid  1 mg Oral Daily   furosemide  80 mg Intravenous TID   heparin  5,000 Units Subcutaneous Q8H   hydrALAZINE  37.5 mg Oral Q8H   isosorbide mononitrate  120 mg Oral Daily   metolazone  5 mg Oral Daily   sevelamer carbonate  800 mg Oral Q supper   sodium bicarbonate  650 mg Oral BID   sodium chloride flush  3 mL Intravenous Q12H  Zetta Bills, MD 03/26/2023, 7:52 AM

## 2023-03-26 NOTE — Consult Note (Signed)
Hospital Consult    Reason for Consult:  dialysis access placement Requesting Physician:  Allena Katz MRN #:  [id number]  History of Present Illness: This is a 72 y.o. female who presented to the hospital with shortness of breath.   She has hx of CHF, HTN, HLD, CKD 3b, OA, chronic lymphedema.    She is now CKD V.  She has been diuresed and her SOB is improved as well as the swelling in her legs.  She was also found to have an acute exacerbation of combined systolic/diastolic heart failure.  She states her shortness of breath is better since being in the hospital.    She does have some left arm swelling that she states has been present for a few weeks after sleeping on her arm wrong.  She states that an ultrasound was done and was negative for DVT.  She states it is getting better.  She is right hand dominant.  She does have swelling in her legs.  She does wear compression and she does elevate her legs at home.  She does not walk and gets around with a wheelchair.  Her daughter and son in law live with her.   Vascular surgery is consulted for dialysis access placement.  Vein mapping has been ordered.  She is from [place].  She is a woman of strong faith.  She worked as the Arts administrator for 39 years.    The pt is not on a statin for cholesterol management.  Allergy to crestor The pt is not on a daily aspirin.   Other AC:  sq heparin The pt is on BB, hydralazine, diuretic for hypertension.   The pt is not on medication for diabetes PTA. Tobacco hx:  never  Past Medical History:  Diagnosis Date   Chronic combined systolic and diastolic congestive heart failure (HCC) 11/09/2021   Generalized anxiety disorder    Hyperlipidemia 11/09/2021   Hypertension    Stage 3b chronic kidney disease (CKD) (HCC) 11/09/2021    Past Surgical History:  Procedure Laterality Date   CATARACT EXTRACTION, BILATERAL     combined systolic and diastolic congestive heart failure       Allergies   Allergen Reactions   Crestor [Rosuvastatin] Other (See Comments)    "Felt like she couldn't swallow"   Nitrofuran Derivatives Other (See Comments)    Unknown reaction    Prior to Admission medications   Medication Sig Start Date End Date Taking? Authorizing Provider  carvedilol (COREG) 3.125 MG tablet Take 3.125 mg by mouth 2 (two) times daily with a meal.   Yes [provider]  Coenzyme Q10 (CO Q 10 PO) Take 1 capsule by mouth daily.   Yes [provider]  Epoetin Alfa-epbx (RETACRIT IJ) Inject 20,000 Units as directed every 14 (fourteen) days.   Yes [provider]  feeding supplement (ENSURE ENLIVE / ENSURE PLUS) LIQD Take 237 mLs by mouth 2 (two) times daily between meals. 06/17/22  Yes Berton Mount I, MD  ferrous sulfate 325 (65 FE) MG EC tablet Take 1 tablet (325 mg total) by mouth 2 (two) times daily. 03/03/23 04/02/23 Yes Lanae Boast, MD  folic acid (FOLVITE) 1 MG tablet Take 1 tablet (1 mg total) by mouth daily. 03/04/23 04/03/23 Yes Lanae Boast, MD  hydrALAZINE (APRESOLINE) 50 MG tablet TAKE 1 TABLET(50 MG) BY MOUTH EVERY 8 HOURS Patient taking differently: Take 25 mg by mouth in the morning and at bedtime. 11/21/22  Yes [name], [name], NP  Menthol-Methyl Salicylate (MUSCLE RUB) 10-15 % CREA Apply 1 Application topically daily as needed for muscle pain.   Yes [provider]  Multiple Vitamins-Minerals (MULTIVITAMIN ADULT) CHEW Chew 1 each by mouth daily.   Yes [provider]  multivitamin (RENA-VIT) TABS tablet Take 1 tablet by mouth daily.   Yes [provider]  sevelamer carbonate (RENVELA) 800 MG tablet Take 800 mg by mouth daily with supper. 07/10/22  Yes [provider]  sodium bicarbonate 650 MG tablet Take 650 mg by mouth 2 (two) times daily.   Yes [provider]  torsemide (DEMADEX) 10 MG tablet Take 40 mg by mouth 2 (two) times daily.   Yes [provider]    Social History   Socioeconomic  History   Marital status: Divorced    Spouse name: Not on file   Number of children: 1   Years of education: Not on file   Highest education level: Not on file  Occupational History   Occupation: retired  Tobacco Use   Smoking status: Never   Smokeless tobacco: Never  Vaping Use   Vaping Use: Never used  Substance and Sexual Activity   Alcohol use: Not Currently   Drug use: Never   Sexual activity: Not Currently  Other Topics Concern   Not on file  Social History Narrative   ** Merged History Encounter **       Tobacco use, amount per day now: N/A Past tobacco use, amount per day: N/A How many years did you use tobacco: N/A Alcohol use (drinks per week): N/A Diet: Do you drink/eat things with caffeine: Coffee Marital status: Divorced                                    What year were you married? 1992 Do you live in a house, apartment, assisted living, condo, trailer, etc.? Is it one or more stories? How many persons live in your home? 2 Do you have pets in your home?( please list) No Highest Level of e   ducation completed? High School Current or past profession: Restaurant Employee Do you exercise?   Yes                               Type and how often? Walk Do you have a living will? No Do you have a DNR form?    No                               If    not, do you want to discuss one? Do you have signed POA/HPOA forms? No                       If so, please bring to you appointment  Do you have any difficulty bathing or dressing yourself? No Do you have any difficulty preparing food or eating? No    Do you have any difficulty managing your medications? No Do you have any difficulty managing your finances? No Do you have any difficulty affording your medications? No   Social Determinants of Health   Financial Resource Strain: Low Risk  (06/11/2022)   Overall Financial Resource Strain (CARDIA)    Difficulty of Paying Living Expenses: Not very hard  Food  Insecurity: No Food  Insecurity (03/22/2023)   Hunger Vital Sign    Worried About Running Out of Food in the Last Year: Never true    Ran Out of Food in the Last Year: Never true  Transportation Needs: No Transportation Needs (03/22/2023)   PRAPARE - Administrator, Civil Service (Medical): No    Lack of Transportation (Non-Medical): No  Physical Activity: Not on file  Stress: Not on file  Social Connections: Not on file  Intimate Partner Violence: Not At Risk (03/22/2023)   Humiliation, Afraid, Rape, and Kick questionnaire    Fear of Current or Ex-Partner: No    Emotionally Abused: No    Physically Abused: No    Sexually Abused: No     Family History  Problem Relation Age of Onset   Throat cancer Father     ROS: [x]  Positive   [ ]  Negative   [ ]  All sytems reviewed and are negative  Cardiac: [x]  CHF  [x]  SOB [x]  HTN [x]  HLD   Vascular: []  pain in legs while walking []  pain in legs at rest []  pain in legs at night []  non-healing ulcers []  hx of DVT [x]  swelling in legs  Pulmonary: []  asthma/wheezing []  home O2  Neurologic: []  hx of CVA []  mini stroke   Hematologic: []  hx of cancer  Endocrine:   []  diabetes []  thyroid disease  GI []  GERD  GU: [x]  CKD/renal failure []  HD--[]  M/W/F or []  T/T/S  Psychiatric: [x]  anxiety []  depression  Musculoskeletal: []  arthritis []  joint pain  Integumentary: []  rashes []  ulcers  Constitutional: []  fever  []  chills  Physical Examination  Vitals:   03/26/23 0520 03/26/23 0840  BP: (!) 148/63 (!) 150/66  Pulse: (!) 57 64  Resp: 16 17  Temp: (!) 97.5 F (36.4 C)   SpO2: 100% 97%   Body mass index is 31.88 kg/m.  General:  WDWN in NAD Gait: Not observed HENT: WNL, normocephalic Pulmonary: normal non-labored breathing Cardiac: regular Abdomen:  soft, NT; aortic pulse is not palpable Skin: without rashes Vascular Exam/Pulses:  Right Left  Radial 2+ (normal) 2+ (normal)  Ulnar 2+ (normal)  1+ (weak)  DP 2+ (normal) Unable to palpate due to swelling  PT Unable to palpate Unable to palpate   Extremities: +BLE swelling; left arm swelling.  IV present in right arm.  Musculoskeletal: no muscle wasting or atrophy  Neurologic: A&O X 3 Psychiatric:  The pt has Normal affect.   CBC    Component Value Date/Time   WBC 3.4 (L) 03/25/2023 0100   RBC 3.19 (L) 03/25/2023 0100   HGB 8.8 (L) 03/25/2023 0100   HGB 8.1 (L) 12/19/2021 1020   HCT 29.0 (L) 03/25/2023 0100   HCT 24.3 (L) 12/19/2021 1020   PLT 134 (L) 03/25/2023 0100   PLT 64 (LL) 12/19/2021 1020   MCV 90.9 03/25/2023 0100   MCV 81 12/19/2021 1020   MCH 27.6 03/25/2023 0100   MCHC 30.3 03/25/2023 0100   RDW 16.3 (H) 03/25/2023 0100   RDW 18.1 (H) 12/19/2021 1020   LYMPHSABS 0.9 03/22/2023 0050   MONOABS 0.1 03/22/2023 0050   EOSABS 0.1 03/22/2023 0050   BASOSABS 0.0 03/22/2023 0050    BMET    Component Value Date/Time   NA 138 03/26/2023 0012   NA 142 01/22/2023 0000   K 4.1 03/26/2023 0012   CL 105 03/26/2023 0012   CO2 24 03/26/2023 0012   GLUCOSE 80 03/26/2023 0012   BUN 76 (  H) 03/26/2023 0012   BUN 60 (A) 01/22/2023 0000   CREATININE 4.00 (H) 03/26/2023 0012   CREATININE 4.06 (H) 11/06/2021 1617   CALCIUM 7.0 (L) 03/26/2023 0012   GFRNONAA 11 (L) 03/26/2023 0012   GFRAA >60 11/19/2019 2130    COAGS: Lab Results  Component Value Date   INR 1.2 08/15/2022   INR 1.1 03/27/2022   INR 1.1 12/22/2021     Non-Invasive Vascular Imaging:   Vein mapping ordered for today    ASSESSMENT/PLAN: This is a 72 y.o. female with CKD V   -pt is right hand dominant but does have swelling in the left arm that subjectively is getting better.  She had a DVT study of the LUE on 5/18 that was negative for DVT.  She has palpable bilateral radial and ulnar pulses. -vein mapping is ordered for today.   -I discussed fistula vs graft with pt and that her vein mapping would really determine what we have to work with.   Discussed that access may not mature or need further interventions.  Also discussed that access does not last forever and may need other interventions or even new access in the future.   -she does have left arm swelling and she is right hand dominant.  Discussed with pt that we may wait until the swelling improves and do this as outpatient if we decide to proceed with the left arm.  She has IV in the right arm.  Will wait for vein mapping before removing IV from right arm.  -Dr. Lenell Antu to evaluate pt and determine further plan   Doreatha Massed, [name] [facility] [phone]

## 2023-03-26 NOTE — Telephone Encounter (Signed)
Spoke with Victorino Dike at [employer] and she will get the message sent to [facility] with the clarification of type of mattress.

## 2023-03-26 NOTE — Progress Notes (Signed)
PROGRESS NOTE    Kara Mullins  UJW:[id number] DOB: 1951-01-25 DOA: 03/22/2023 PCP: Caesar Bookman, NP  71/M with history of hypertension, diastolic CHF, CKD 5, anxiety disorder presented to the ED with worsening shortness of breath and swelling. -Recently hospitalized 5/14-5/19 with CHF exacerbation and AKI/CKD, cardiorenal syndrome, followed by cards and nephrology, diuresed and transition to torsemide 40 Mg twice daily, she also has significant debility and is mostly nonambulatory, bedbound.  Lives with her daughter, compliant with meds, back in the ED with volume overload, hypertensive, BNP 715, hemoglobin 11, creatinine 3.8, chest x-ray with bilateral airspace disease and small to moderate bilateral effusions -Poor response to diuretics, creatinine around 4, nephrology consulting, plan to start HD  Subjective: -Breathing better, seen by nephrology yesterday, plan to start HD  Assessment and Plan:  Acute on chronic systolic and diastolic CHF Pleural effusion Anasarca -echo with EF down to 45-50% with normal RV and moderately elevated PA systolic pressures -Remains significantly volume overloaded, has 2-3+ edema extending to upper thighs  -CT chest with moderate bilateral pleural pleural effusions -Remains on IV Lasix, only 3.2 L negative, weight down 5 LB  -Nephrology consulting, plan to consult vascular for dialysis access -Continue carvedilol, hydralazine, Imdur, GDMT limited by CKD 5  -Palliative consulted for goals of care, prognosis is poor with CKD 5, very poor functional status, wants full scope of treatment and now HD, finally agreeable to DNR yesterday   Leukopenia -Mild, monitor   Prolonged QT interval QTc 511 -Caution with meds   Anemia chronic kidney disease -Hemoglobin 8.8, close to her baseline -Anemia panel suggestive of chronic disease, starting EPO today   Chronic kidney disease stage V -Creatinine around baseline but with significant volume  overload -Nephrology consulting, plan to start HD, see discussion above  Sacral pressure wound -Air overlay mattress, wound care   Debility Patient is essentially bedbound at baseline.    -PT OT   DVT prophylaxis: Heparin Advanc.e Care Planning:   Discussed CODE STATUS with the patient again on 6/11, she was agreeable to DNR Family Communication: Daughter at bedside  Disposition Plan: To be determined  Consultants: Cards   Procedures:   Antimicrobials:    Objective: Vitals:   03/26/23 0009 03/26/23 0520 03/26/23 0523 03/26/23 0840  BP: 122/67 (!) 148/63  (!) 150/66  Pulse: (!) 56 (!) 57  64  Resp: 11 16  17   Temp: (!) 97.4 F (36.3 C) (!) 97.5 F (36.4 C)  97.6 F (36.4 C)  TempSrc: Oral Other (Comment)  Oral  SpO2: 100% 100%  97%  Weight:   86.9 kg   Height:        Intake/Output Summary (Last 24 hours) at 03/26/2023 1034 Last data filed at 03/26/2023 0900 Gross per 24 hour  Intake 120 ml  Output 1545 ml  Net -1425 ml   Filed Weights   03/24/23 0428 03/25/23 0414 03/26/23 0523  Weight: 93.4 kg 86.9 kg 86.9 kg    Examination:  General exam: Chronically ill female sitting up in bed, AAOx3 HEENT: Positive JVD CVS: S1-S2, regular rhythm Lungs: Decreased breath sounds at the bases  Abdomen: Soft, nontender, bowel sounds present These: 2-3+ edema extending to upper thighs   Data Reviewed:   CBC: Recent Labs  Lab 03/22/23 0050 03/23/23 0105 03/24/23 0050 03/25/23 0100  WBC 3.1* 3.4* 2.9* 3.4*  NEUTROABS 1.9  --   --   --   HGB 11.2* 9.0* 8.4* 8.8*  HCT 36.3 29.5* 28.0* 29.0*  MCV  90.3 90.5 92.4 90.9  PLT 171 132* 121* 134*   Basic Metabolic Panel: Recent Labs  Lab 03/22/23 0050 03/23/23 0105 03/24/23 0050 03/25/23 0100 03/25/23 1212 03/26/23 0012  NA 137 136 135 136  --  138  K 4.1 3.6 3.5 4.1  --  4.1  CL 105 106 104 103  --  105  CO2 21* 22 21* 23  --  24  GLUCOSE 80 66* 79 91  --  80  BUN 78* 76* 76* 77*  --  76*  CREATININE 3.88*  3.83* 4.00* 4.03*  --  4.00*  CALCIUM 7.1* 6.7* 6.7* 7.0*  --  7.0*  MG  --   --   --   --  2.6*  --   PHOS  --   --   --   --   --  6.8*   GFR: Estimated Creatinine Clearance: 14.3 mL/min (A) (by C-G formula based on SCr of 4 mg/dL (H)). Liver Function Tests: Recent Labs  Lab 03/23/23 0102 03/24/23 0050 03/26/23 0012  AST 23 26  --   ALT 14 14  --   ALKPHOS 73 68  --   BILITOT 0.4 0.4  --   PROT 5.3* 5.3*  --   ALBUMIN 1.8* 1.8* 1.9*   No results for input(s): "LIPASE", "AMYLASE" in the last 168 hours. No results for input(s): "AMMONIA" in the last 168 hours. Coagulation Profile: No results for input(s): "INR", "PROTIME" in the last 168 hours. Cardiac Enzymes: No results for input(s): "CKTOTAL", "CKMB", "CKMBINDEX", "TROPONINI" in the last 168 hours. BNP (last 3 results) No results for input(s): "PROBNP" in the last 8760 hours. HbA1C: No results for input(s): "HGBA1C" in the last 72 hours. CBG: Recent Labs  Lab 03/24/23 2154  GLUCAP 89   Lipid Profile: No results for input(s): "CHOL", "HDL", "LDLCALC", "TRIG", "CHOLHDL", "LDLDIRECT" in the last 72 hours. Thyroid Function Tests: Recent Labs    03/24/23 0803  TSH 2.488   Anemia Panel: Recent Labs    03/25/23 1212  FERRITIN 648*  TIBC 144*  IRON 41   Urine analysis:    Component Value Date/Time   COLORURINE YELLOW (A) 10/04/2022 1151   APPEARANCEUR TURBID (A) 10/04/2022 1151   LABSPEC 1.012 10/04/2022 1151   PHURINE 8.0 10/04/2022 1151   GLUCOSEU NEGATIVE 10/04/2022 1151   HGBUR NEGATIVE 10/04/2022 1151   BILIRUBINUR Negative 12/05/2022 1454   KETONESUR NEGATIVE 10/04/2022 1151   PROTEINUR Positive (A) 12/05/2022 1454   PROTEINUR >=300 (A) 10/04/2022 1151   UROBILINOGEN 0.2 12/05/2022 1454   UROBILINOGEN 0.2 11/19/2019 2004   NITRITE Negative 12/05/2022 1454   NITRITE NEGATIVE 10/04/2022 1151   LEUKOCYTESUR Moderate (2+) (A) 12/05/2022 1454   LEUKOCYTESUR LARGE (A) 10/04/2022 1151   Sepsis  Labs: @LABRCNTIP (procalcitonin:4,lacticidven:4)  )No results found for this or any previous visit (from the past 240 hour(s)).   Radiology Studies: No results found.   Scheduled Meds:  carvedilol  6.25 mg Oral BID WC   Chlorhexidine Gluconate Cloth  6 each Topical Daily   darbepoetin (ARANESP) injection - NON-DIALYSIS  100 mcg Subcutaneous Q Wed-1800   folic acid  1 mg Oral Daily   furosemide  80 mg Intravenous TID   heparin  5,000 Units Subcutaneous Q8H   hydrALAZINE  37.5 mg Oral Q8H   isosorbide mononitrate  120 mg Oral Daily   metolazone  5 mg Oral Daily   sevelamer carbonate  1,600 mg Oral TID WC   sodium chloride flush  3 mL Intravenous Q12H   Continuous Infusions:  ferric gluconate (FERRLECIT) IVPB       LOS: 4 days    Time spent:    Zannie Cove, MD [facility]   03/26/2023, 10:34 AM

## 2023-03-26 NOTE — Progress Notes (Signed)
Progress Note  Patient Name: Kara Mullins Date of Encounter: 03/26/2023  Primary Cardiologist:   [name], MD   Subjective   She denies pain or SOB.     Inpatient Medications    Scheduled Meds:  carvedilol  6.25 mg Oral BID WC   Chlorhexidine Gluconate Cloth  6 each Topical Daily   darbepoetin (ARANESP) injection - NON-DIALYSIS  100 mcg Subcutaneous Q Wed-1800   folic acid  1 mg Oral Daily   furosemide  80 mg Intravenous TID   heparin  5,000 Units Subcutaneous Q8H   hydrALAZINE  37.5 mg Oral Q8H   isosorbide mononitrate  120 mg Oral Daily   metolazone  5 mg Oral Daily   sevelamer carbonate  800 mg Oral Q supper   sodium bicarbonate  650 mg Oral BID   sodium chloride flush  3 mL Intravenous Q12H   Continuous Infusions:  ferric gluconate (FERRLECIT) IVPB     PRN Meds: acetaminophen **OR** acetaminophen, albuterol, hydrALAZINE, Muscle Rub   Vital Signs    Vitals:   03/25/23 2001 03/26/23 0009 03/26/23 0520 03/26/23 0523  BP: (!) 148/69 122/67 (!) 148/63   Pulse: 61 (!) 56 (!) 57   Resp: 15 11 16    Temp: (!) 97.5 F (36.4 C) (!) 97.4 F (36.3 C) (!) 97.5 F (36.4 C)   TempSrc: Oral Oral Other (Comment)   SpO2: 96% 100% 100%   Weight:    86.9 kg  Height:        Intake/Output Summary (Last 24 hours) at 03/26/2023 0804 Last data filed at 03/26/2023 0523 Gross per 24 hour  Intake 50 ml  Output 1545 ml  Net -1495 ml   Filed Weights   03/24/23 0428 03/25/23 0414 03/26/23 0523  Weight: 93.4 kg 86.9 kg 86.9 kg    Telemetry    NSR - Personally Reviewed  ECG    NA - Personally Reviewed  Physical Exam   GEN: No  acute distress.   Neck: No  JVD Cardiac: RRR, no murmurs, rubs, or gallops.  Respiratory: Clear   to auscultation bilaterally. GI: Soft, nontender, non-distended, normal bowel sounds  MS:  Severe edema; No deformity. Neuro:   Nonfocal Psych: Oriented and appropriate -  Labs    Chemistry Recent Labs  Lab 03/23/23 0102  03/23/23 0105 03/24/23 0050 03/25/23 0100 03/26/23 0012  NA  --    < > 135 136 138  K  --    < > 3.5 4.1 4.1  CL  --    < > 104 103 105  CO2  --    < > 21* 23 24  GLUCOSE  --    < > 79 91 80  BUN  --    < > 76* 77* 76*  CREATININE  --    < > 4.00* 4.03* 4.00*  CALCIUM  --    < > 6.7* 7.0* 7.0*  PROT 5.3*  --  5.3*  --   --   ALBUMIN 1.8*  --  1.8*  --  1.9*  AST 23  --  26  --   --   ALT 14  --  14  --   --   ALKPHOS 73  --  68  --   --   BILITOT 0.4  --  0.4  --   --   GFRNONAA  --    < > 11* 11* 11*  ANIONGAP  --    < >  10 10 9    < > = values in this interval not displayed.     Hematology Recent Labs  Lab 03/23/23 0105 03/24/23 0050 03/25/23 0100  WBC 3.4* 2.9* 3.4*  RBC 3.26* 3.03* 3.19*  HGB 9.0* 8.4* 8.8*  HCT 29.5* 28.0* 29.0*  MCV 90.5 92.4 90.9  MCH 27.6 27.7 27.6  MCHC 30.5 30.0 30.3  RDW 16.7* 16.6* 16.3*  PLT 132* 121* 134*    Cardiac EnzymesNo results for input(s): "TROPONINI" in the last 168 hours. No results for input(s): "TROPIPOC" in the last 168 hours.   BNP Recent Labs  Lab 03/22/23 0050  BNP 715.1*     DDimer No results for input(s): "DDIMER" in the last 168 hours.   Radiology    No results found.  Cardiac Studies    Echo:    1. Left ventricular ejection fraction, by estimation, is 45 to 50%. The  left ventricle has mildly decreased function. The left ventricle  demonstrates global hypokinesis.   2. Right ventricular systolic function is normal. The right ventricular  size is normal. There is moderately elevated pulmonary artery systolic  pressure.   3. Large pleural effusion in the left lateral region.   4. Mild mitral valve regurgitation.   5. The tricuspid valve is abnormal. Tricuspid valve regurgitation is  mild.   6. The aortic valve is bicuspid. There is mild calcification of the  aortic valve. There is mild thickening of the aortic valve. Aortic valve  regurgitation is mild.   7. The inferior vena cava is normal in  size with <50% respiratory  variability, suggesting right atrial pressure of 8 mmHg.   8. Limited echo to evaluate LV function   Patient Profile     72 y.o. female  with a hx of hypertension, hyperlipidemia, chronic lymphedema, CKD stage IV and chronic combined systolic and diastolic heart failure with improved EF who was seen 03/22/2023 for the evaluation of CHF at the request of Dr. Katrinka Blazing.   Assessment & Plan    Chronic diastolic heart failure:   I/O negative 3297 cc.  She was given a dose of Zaroxolyn yesterday.  Scheduled again today.  Also being considered for dialysis now.    Hypertension:   BP is not controlled but improving.  Titrated hydralazine and nitrates and changed to TID yesterday.  Note a dose of beta blocker was held secondary to bradycardia.   No change in therapy.     Prolonged QT:   QT improved. Still was upper limites of normal.  Avoid QT prolonging meds.    Chronic lymphedema:   Would benefit eventually from out patient PT   CKD stage IV:  Seen by nephrology.  Plans as above.       Goals of therapy:  Palliative care has seen the patient.    For questions or updates, please contact [facility] Please consult [email or web address] for contact info under Cardiology/STEMI.   Signed, Rollene Rotunda, MD  03/26/2023, 8:04 AM

## 2023-03-27 ENCOUNTER — Other Ambulatory Visit: Payer: Self-pay

## 2023-03-27 ENCOUNTER — Encounter (HOSPITAL_COMMUNITY): Payer: Self-pay | Admitting: Internal Medicine

## 2023-03-27 ENCOUNTER — Inpatient Hospital Stay (HOSPITAL_COMMUNITY): Payer: Medicare (Managed Care) | Admitting: Registered Nurse

## 2023-03-27 ENCOUNTER — Encounter (HOSPITAL_COMMUNITY)
Admission: EM | Disposition: A | Discharge status: Home Health | Payer: Self-pay | Source: Home / Self Care | Attending: Internal Medicine

## 2023-03-27 ENCOUNTER — Inpatient Hospital Stay (HOSPITAL_COMMUNITY): Payer: Medicare (Managed Care)

## 2023-03-27 DIAGNOSIS — E785 Hyperlipidemia, unspecified: Secondary | ICD-10-CM | POA: Diagnosis not present

## 2023-03-27 DIAGNOSIS — I132 Hypertensive heart and chronic kidney disease with heart failure and with stage 5 chronic kidney disease, or end stage renal disease: Secondary | ICD-10-CM

## 2023-03-27 DIAGNOSIS — N185 Chronic kidney disease, stage 5: Secondary | ICD-10-CM | POA: Diagnosis not present

## 2023-03-27 DIAGNOSIS — E039 Hypothyroidism, unspecified: Secondary | ICD-10-CM

## 2023-03-27 DIAGNOSIS — D638 Anemia in other chronic diseases classified elsewhere: Secondary | ICD-10-CM

## 2023-03-27 DIAGNOSIS — I509 Heart failure, unspecified: Secondary | ICD-10-CM

## 2023-03-27 DIAGNOSIS — I10 Essential (primary) hypertension: Secondary | ICD-10-CM | POA: Diagnosis not present

## 2023-03-27 DIAGNOSIS — I5033 Acute on chronic diastolic (congestive) heart failure: Secondary | ICD-10-CM | POA: Diagnosis not present

## 2023-03-27 DIAGNOSIS — D631 Anemia in chronic kidney disease: Secondary | ICD-10-CM | POA: Diagnosis not present

## 2023-03-27 DIAGNOSIS — Z515 Encounter for palliative care: Secondary | ICD-10-CM | POA: Diagnosis not present

## 2023-03-27 DIAGNOSIS — Z7189 Other specified counseling: Secondary | ICD-10-CM | POA: Diagnosis not present

## 2023-03-27 HISTORY — PX: AV FISTULA PLACEMENT: SHX1204

## 2023-03-27 HISTORY — PX: INSERTION OF DIALYSIS CATHETER: SHX1324

## 2023-03-27 LAB — RENAL FUNCTION PANEL
Albumin: 1.9 g/dL — ABNORMAL LOW (ref 3.5–5.0)
Anion gap: 9 (ref 5–15)
BUN: 76 mg/dL — ABNORMAL HIGH (ref 8–23)
CO2: 24 mmol/L (ref 22–32)
Calcium: 7 mg/dL — ABNORMAL LOW (ref 8.9–10.3)
Chloride: 103 mmol/L (ref 98–111)
Creatinine, Ser: 3.86 mg/dL — ABNORMAL HIGH (ref 0.44–1.00)
GFR, Estimated: 12 mL/min — ABNORMAL LOW (ref >60)
Glucose, Bld: 78 mg/dL (ref 70–99)
Phosphorus: 6.5 mg/dL — ABNORMAL HIGH (ref 2.5–4.6)
Potassium: 4.2 mmol/L (ref 3.5–5.1)
Sodium: 136 mmol/L (ref 135–145)

## 2023-03-27 LAB — HEPATITIS B SURFACE ANTIGEN: Hepatitis B Surface Ag: NONREACTIVE

## 2023-03-27 SURGERY — ARTERIOVENOUS (AV) FISTULA CREATION
Anesthesia: General | Site: Neck

## 2023-03-27 MED ORDER — FENTANYL CITRATE (PF) 250 MCG/5ML IJ SOLN
INTRAMUSCULAR | Status: AC
  Filled 2023-03-27: qty 5

## 2023-03-27 MED ORDER — SUGAMMADEX SODIUM 200 MG/2ML IV SOLN
INTRAVENOUS | Status: DC | PRN
  Administered 2023-03-27: 150 mg via INTRAVENOUS
  Administered 2023-03-27: 50 mg via INTRAVENOUS

## 2023-03-27 MED ORDER — EPHEDRINE 5 MG/ML INJ
INTRAVENOUS | Status: AC
  Filled 2023-03-27: qty 5

## 2023-03-27 MED ORDER — SODIUM CHLORIDE 0.9 % IV SOLN: INTRAVENOUS | Status: DC

## 2023-03-27 MED ORDER — FENTANYL CITRATE (PF) 100 MCG/2ML IJ SOLN
INTRAMUSCULAR | Status: DC | PRN
  Administered 2023-03-27: 50 ug via INTRAVENOUS

## 2023-03-27 MED ORDER — MIDAZOLAM HCL 2 MG/2ML IJ SOLN
INTRAMUSCULAR | Status: AC
  Filled 2023-03-27: qty 2

## 2023-03-27 MED ORDER — PHENYLEPHRINE 80 MCG/ML (10ML) SYRINGE FOR IV PUSH (FOR BLOOD PRESSURE SUPPORT)
PREFILLED_SYRINGE | INTRAVENOUS | Status: AC
  Filled 2023-03-27: qty 10

## 2023-03-27 MED ORDER — ONDANSETRON HCL 4 MG/2ML IJ SOLN
INTRAMUSCULAR | Status: AC
  Filled 2023-03-27: qty 2

## 2023-03-27 MED ORDER — PROPOFOL 10 MG/ML IV BOLUS
INTRAVENOUS | Status: DC | PRN
  Administered 2023-03-27: 40 mg via INTRAVENOUS

## 2023-03-27 MED ORDER — CEFAZOLIN SODIUM-DEXTROSE 2-3 GM-%(50ML) IV SOLR
INTRAVENOUS | Status: DC | PRN
  Administered 2023-03-27: 2 g via INTRAVENOUS

## 2023-03-27 MED ORDER — HEPARIN 6000 UNIT IRRIGATION SOLUTION
Status: AC
  Filled 2023-03-27: qty 500

## 2023-03-27 MED ORDER — DEXAMETHASONE SODIUM PHOSPHATE 10 MG/ML IJ SOLN
INTRAMUSCULAR | Status: AC
  Filled 2023-03-27: qty 1

## 2023-03-27 MED ORDER — 0.9 % SODIUM CHLORIDE (POUR BTL) OPTIME
TOPICAL | Status: DC | PRN
  Administered 2023-03-27: 1000 mL

## 2023-03-27 MED ORDER — FENTANYL CITRATE (PF) 100 MCG/2ML IJ SOLN: 25.0000 ug | INTRAMUSCULAR | Status: DC | PRN

## 2023-03-27 MED ORDER — ROCURONIUM BROMIDE 10 MG/ML (PF) SYRINGE
PREFILLED_SYRINGE | INTRAVENOUS | Status: DC | PRN
  Administered 2023-03-27: 40 mg via INTRAVENOUS
  Administered 2023-03-27: 20 mg via INTRAVENOUS

## 2023-03-27 MED ORDER — HYDROCORTISONE 1 % EX CREA
TOPICAL_CREAM | Freq: Two times a day (BID) | CUTANEOUS | Status: DC
  Administered 2023-03-28: 1 via TOPICAL
  Filled 2023-03-27: qty 28

## 2023-03-27 MED ORDER — LIDOCAINE 2% (20 MG/ML) 5 ML SYRINGE
INTRAMUSCULAR | Status: DC | PRN
  Administered 2023-03-27: 40 mg via INTRAVENOUS

## 2023-03-27 MED ORDER — PROPOFOL 10 MG/ML IV BOLUS
INTRAVENOUS | Status: AC
  Filled 2023-03-27: qty 20

## 2023-03-27 MED ORDER — PROPOFOL 1000 MG/100ML IV EMUL
INTRAVENOUS | Status: AC
  Filled 2023-03-27: qty 100

## 2023-03-27 MED ORDER — LIDOCAINE 2% (20 MG/ML) 5 ML SYRINGE
INTRAMUSCULAR | Status: AC
  Filled 2023-03-27: qty 5

## 2023-03-27 MED ORDER — HEPARIN SODIUM (PORCINE) 1000 UNIT/ML IJ SOLN
INTRAMUSCULAR | Status: DC | PRN
  Administered 2023-03-27: 3200 [IU]

## 2023-03-27 MED ORDER — LIDOCAINE-EPINEPHRINE (PF) 1 %-1:200000 IJ SOLN: INTRAMUSCULAR | Status: DC | PRN

## 2023-03-27 MED ORDER — CHLORHEXIDINE GLUCONATE CLOTH 2 % EX PADS
6.0000 | MEDICATED_PAD | Freq: Every day | CUTANEOUS | Status: DC
  Administered 2023-03-27: 6 via TOPICAL

## 2023-03-27 MED ORDER — DEXAMETHASONE SODIUM PHOSPHATE 10 MG/ML IJ SOLN
INTRAMUSCULAR | Status: DC | PRN
  Administered 2023-03-27: 5 mg via INTRAVENOUS

## 2023-03-27 MED ORDER — FENTANYL CITRATE (PF) 100 MCG/2ML IJ SOLN
INTRAMUSCULAR | Status: AC
  Filled 2023-03-27: qty 2

## 2023-03-27 MED ORDER — EPHEDRINE SULFATE-NACL 50-0.9 MG/10ML-% IV SOSY
PREFILLED_SYRINGE | INTRAVENOUS | Status: DC | PRN
  Administered 2023-03-27 (×2): 5 mg via INTRAVENOUS
  Administered 2023-03-27: 10 mg via INTRAVENOUS

## 2023-03-27 MED ORDER — ONDANSETRON HCL 4 MG/2ML IJ SOLN
INTRAMUSCULAR | Status: DC | PRN
  Administered 2023-03-27: 4 mg via INTRAVENOUS

## 2023-03-27 MED ORDER — LIDOCAINE-EPINEPHRINE (PF) 1 %-1:200000 IJ SOLN
INTRAMUSCULAR | Status: AC
  Filled 2023-03-27: qty 30

## 2023-03-27 MED ORDER — ACETAMINOPHEN 500 MG PO TABS: 1000.0000 mg | ORAL_TABLET | Freq: Once | ORAL | Status: AC

## 2023-03-27 MED ORDER — ORAL CARE MOUTH RINSE: 15.0000 mL | Freq: Once | OROMUCOSAL | Status: AC

## 2023-03-27 MED ORDER — HEPARIN SODIUM (PORCINE) 1000 UNIT/ML IJ SOLN
INTRAMUSCULAR | Status: AC
  Filled 2023-03-27: qty 10

## 2023-03-27 MED ORDER — HEPARIN 6000 UNIT IRRIGATION SOLUTION
Status: DC | PRN
  Administered 2023-03-27: 1

## 2023-03-27 MED ORDER — ACETAMINOPHEN 500 MG PO TABS
ORAL_TABLET | ORAL | Status: AC
  Administered 2023-03-27: 1000 mg via ORAL
  Filled 2023-03-27: qty 2

## 2023-03-27 MED ORDER — CHLORHEXIDINE GLUCONATE 0.12 % MT SOLN
15.0000 mL | Freq: Once | OROMUCOSAL | Status: AC
  Administered 2023-03-27: 15 mL via OROMUCOSAL

## 2023-03-27 SURGICAL SUPPLY — 65 items
APL PRP STRL LF DISP 70% ISPRP (MISCELLANEOUS) ×2
APL SKNCLS NONHYPOALLERGENIC (GAUZE/BANDAGES/DRESSINGS) ×2
APL SKNCLS STERI-STRIP NONHPOA (GAUZE/BANDAGES/DRESSINGS) ×2
ARMBAND PINK RESTRICT EXTREMIT (MISCELLANEOUS) ×2 IMPLANT
BAG DECANTER FOR FLEXI CONT (MISCELLANEOUS) ×2 IMPLANT
BENZOIN TINCTURE PRP APPL 2/3 (GAUZE/BANDAGES/DRESSINGS) ×2 IMPLANT
BIOPATCH RED 1 DISK 7.0 (GAUZE/BANDAGES/DRESSINGS) ×2 IMPLANT
CANISTER SUCT 3000ML PPV (MISCELLANEOUS) ×2 IMPLANT
CANNULA VESSEL 3MM 2 BLNT TIP (CANNULA) ×2 IMPLANT
CATH PALINDROME-P 19CM W/VT (CATHETERS) IMPLANT
CATH PALINDROME-P 23CM W/VT (CATHETERS) IMPLANT
CATH PALINDROME-P 28CM W/VT (CATHETERS) IMPLANT
CHLORAPREP W/TINT 26 (MISCELLANEOUS) ×2 IMPLANT
CLIP LIGATING EXTRA MED SLVR (CLIP) ×2 IMPLANT
CLIP LIGATING EXTRA SM BLUE (MISCELLANEOUS) ×2 IMPLANT
COVER PROBE W GEL 5X96 (DRAPES) ×2 IMPLANT
COVER SURGICAL LIGHT HANDLE (MISCELLANEOUS) ×2 IMPLANT
DRAPE C-ARM 42X72 X-RAY (DRAPES) ×2 IMPLANT
DRAPE CHEST BREAST 15X10 FENES (DRAPES) ×2 IMPLANT
DRSG COVADERM 4X6 (GAUZE/BANDAGES/DRESSINGS) IMPLANT
DRSG OPSITE 4X5.5 SM (GAUZE/BANDAGES/DRESSINGS) IMPLANT
ELECT REM PT RETURN 9FT ADLT (ELECTROSURGICAL) ×2
ELECTRODE REM PT RTRN 9FT ADLT (ELECTROSURGICAL) ×2 IMPLANT
GAUZE 4X4 16PLY ~~LOC~~+RFID DBL (SPONGE) ×2 IMPLANT
GAUZE SPONGE 4X4 12PLY STRL (GAUZE/BANDAGES/DRESSINGS) ×2 IMPLANT
GLOVE BIO SURGEON STRL SZ8 (GLOVE) ×2 IMPLANT
GOWN STRL REUS W/ TWL LRG LVL3 (GOWN DISPOSABLE) ×4 IMPLANT
GOWN STRL REUS W/ TWL XL LVL3 (GOWN DISPOSABLE) ×2 IMPLANT
GOWN STRL REUS W/TWL LRG LVL3 (GOWN DISPOSABLE) ×4
GOWN STRL REUS W/TWL XL LVL3 (GOWN DISPOSABLE) ×2
INSERT FOGARTY SM (MISCELLANEOUS) IMPLANT
KIT BASIN OR (CUSTOM PROCEDURE TRAY) ×2 IMPLANT
KIT MICROPUNCTURE NIT STIFF (SHEATH) IMPLANT
KIT PALINDROME-P 55CM (CATHETERS) IMPLANT
KIT TURNOVER KIT B (KITS) ×2 IMPLANT
LOOP VASCULAR MINI 18 RED (MISCELLANEOUS) ×2
NDL 18GX1X1/2 (RX/OR ONLY) (NEEDLE) ×2 IMPLANT
NDL HYPO 25GX1X1/2 BEV (NEEDLE) ×2 IMPLANT
NEEDLE 18GX1X1/2 (RX/OR ONLY) (NEEDLE) ×2 IMPLANT
NEEDLE HYPO 25GX1X1/2 BEV (NEEDLE) ×2 IMPLANT
NS IRRIG 1000ML POUR BTL (IV SOLUTION) ×2 IMPLANT
PACK BASIC III (CUSTOM PROCEDURE TRAY) ×2
PACK CV ACCESS (CUSTOM PROCEDURE TRAY) ×2 IMPLANT
PACK SRG BSC III STRL LF ECLPS (CUSTOM PROCEDURE TRAY) ×2 IMPLANT
PAD ARMBOARD 7.5X6 YLW CONV (MISCELLANEOUS) ×4 IMPLANT
SET MICROPUNCTURE 5F STIFF (MISCELLANEOUS) ×2 IMPLANT
SLING ARM FOAM STRAP LRG (SOFTGOODS) IMPLANT
SLING ARM FOAM STRAP MED (SOFTGOODS) IMPLANT
SOAP 2 % CHG 4 OZ (WOUND CARE) ×2 IMPLANT
STRIP CLOSURE SKIN 1/2X4 (GAUZE/BANDAGES/DRESSINGS) ×2 IMPLANT
SUT ETHILON 3 0 PS 1 (SUTURE) ×2 IMPLANT
SUT MNCRL AB 4-0 PS2 18 (SUTURE) ×2 IMPLANT
SUT PROLENE 6 0 BV (SUTURE) ×2 IMPLANT
SUT VIC AB 3-0 SH 27 (SUTURE) ×2
SUT VIC AB 3-0 SH 27X BRD (SUTURE) ×2 IMPLANT
SYR 10ML LL (SYRINGE) ×2 IMPLANT
SYR 20ML LL LF (SYRINGE) ×4 IMPLANT
SYR 3ML LL SCALE MARK (SYRINGE) IMPLANT
SYR 5ML LL (SYRINGE) ×2 IMPLANT
SYR CONTROL 10ML LL (SYRINGE) ×2 IMPLANT
TOWEL GREEN STERILE (TOWEL DISPOSABLE) ×2 IMPLANT
TOWEL GREEN STERILE FF (TOWEL DISPOSABLE) ×4 IMPLANT
UNDERPAD 30X36 HEAVY ABSORB (UNDERPADS AND DIAPERS) ×2 IMPLANT
VASCULAR TIE MINI RED 18IN STL (MISCELLANEOUS) IMPLANT
WATER STERILE IRR 1000ML POUR (IV SOLUTION) ×2 IMPLANT

## 2023-03-27 NOTE — Assessment & Plan Note (Addendum)
Calculated BMI is 31.9   Dysphagia  Patient had difficulty swallowing will check chest radiograph and follow up with speech evaluation.

## 2023-03-27 NOTE — Assessment & Plan Note (Signed)
Follow up cell count.  

## 2023-03-27 NOTE — Assessment & Plan Note (Signed)
Rate controlled, patient not on anticoagulation at this point.

## 2023-03-27 NOTE — Anesthesia Procedure Notes (Signed)
Procedure Name: Intubation Date/Time: 03/27/2023 1:33 PM  Performed by: De Nurse, CRNAPre-anesthesia Checklist: Patient identified, Emergency Drugs available, Suction available and Patient being monitored Patient Re-evaluated:Patient Re-evaluated prior to induction Oxygen Delivery Method: Circle System Utilized Preoxygenation: Pre-oxygenation with 100% oxygen Induction Type: IV induction Ventilation: Mask ventilation without difficulty Laryngoscope Size: Mac and 3 Grade View: Grade I Tube type: Oral Tube size: 7.0 mm Number of attempts: 1 Airway Equipment and Method: Stylet and Oral airway Placement Confirmation: ETT inserted through vocal cords under direct vision, positive ETCO2 and breath sounds checked- equal and bilateral Secured at: 20 cm Tube secured with: Tape Dental Injury: Teeth and Oropharynx as per pre-operative assessment

## 2023-03-27 NOTE — Op Note (Signed)
DATE OF SERVICE: 03/27/2023  PATIENT:  Kara Mullins  72 y.o. female  PRE-OPERATIVE DIAGNOSIS:  CKD V  POST-OPERATIVE DIAGNOSIS:  Same  PROCEDURE:   1) right internal jugular tunneled dialysis catheter placement (19cm) 2) left brachiocephalic arteriovenous fistula creation  SURGEON:  Surgeon(s) and Role:    * [name], MD - Primary  ASSISTANT: Loel Dubonnet, [name]  An experienced assistant was required given the complexity of this procedure and the standard of surgical care. My assistant helped with exposure through counter tension, suctioning, ligation and retraction to better visualize the surgical field.  My assistant expedited sewing during the case by following my sutures. Wherever I use the term "we" in the report, my assistant actively helped me with that portion of the procedure.  ANESTHESIA:   general  EBL: minimal  BLOOD ADMINISTERED:none  DRAINS: none   LOCAL MEDICATIONS USED:  NONE  SPECIMEN:  none  COUNTS: confirmed correct.  TOURNIQUET:  none  PATIENT DISPOSITION:  PACU - hemodynamically stable.   Delay start of Pharmacological VTE agent (>24hrs) due to surgical blood loss or risk of bleeding: no  INDICATION FOR PROCEDURE: Kara Mullins is a 72 y.o. female with deteriorating renal function, nearing renal failure.  She needs urgent dialysis access. After careful discussion of risks, benefits, and alternatives the patient was offered this catheter placement and creation of an arteriovenous fistula in the left, nondominant arm. The patient understood and wished to proceed.  OPERATIVE FINDINGS: Successful TDC placement.  Successful creation of a left brachiocephalic AV fistula.  Good Doppler bruit completion.  Good Doppler flow in the radial artery at completion.  DESCRIPTION OF PROCEDURE: After identification of the patient in the pre-operative holding area, the patient was transferred to the operating room. The patient was positioned supine on the  operating room table. Anesthesia was induced. The neck, chest, left upper extremity were prepped and draped in standard fashion. A surgical pause was performed confirming correct patient, procedure, and operative location.  Using ultrasound guidance the right internal jugular vein was accessed with micropuncture technique.  Through the micropuncture sheath a floppy J-wire was advanced into the superior vena cava.  A small incision was made around the skin access point.  The access point was serially dilated under direct fluoroscopic guidance.  A peel-away sheath was introduced into the superior vena cava under fluoroscopic guidance.  A counterincision was made in the chest under the clavicle.  A 19 cm tunnel dialysis catheter was then tunneled under the skin, over the clavicle into the incision in the neck.  The tunneling device was removed and the catheter fed through the peel-away sheath into the superior vena cava.  The peel-away sheath was removed and the catheter gently pulled back.  Adequate position was confirmed with x-ray.  The catheter was tested and found to flush and draw back well.  Catheter was heparin locked.  Caps were applied.  Catheter was sutured to the skin.  The neck incision was closed with 4-0 Monocryl.  Using intraoperative ultrasound, the course of the left upper extremity superficial veins was mapped.  The cephalic vein appeared adequate for arteriovenous fistula creation.  The brachial artery was similarly mapped.  The artery appeared adequate for arterial venous creation. We ensured there was no anomalous arterial anatomy such as a high bifurcation.  A transverse incision was made in the left arm just distal to the antecubital crease.  Incision was carried down through subcutaneous tissue until the cephalic vein was identified and skeletonized.  We continued our exposure through the aponeurosis of the biceps.  The brachial artery was encountered its usual position.  The artery was  circumferentially exposed and encircled with Silastic Vesseloops.  Patient was systemically heparinized.  The distal cephalic vein was transected.  The distal stump of the cephalic vein was oversewn with a 2-0 silk suture.  The proximal vein was controlled with a bulldog clamp.  The brachial artery was clamped proximally medially.  An anterior arteriotomy was made with a 11 blade.  The arteriotomy was extended with Potts scissors.  Using a parachute technique the cephalic vein was anastomosed to the brachial arteriotomy in end-to-side fashion with continuous running suture of 6-0 Prolene.  Immediately prior to completion the anastomosis was flushed and de-aired.  The anastomosis was completed.  Hemostasis was assured.  The fistula was interrogated with Doppler.  Audible bruit was heard throughout the course of the cephalic vein.  A radial artery signal was heard which augmented slightly with compression of the fistula.  Upon completion of the case instrument and sharps counts were confirmed correct. The patient was transferred to the PACU in good condition. I was present for all portions of the procedure.  FOLLOW UP PLAN: Assuming a normal postoperative course, a VVS PA will see the patient in 6 weeks with AVF duplex.   Rande Brunt. Lenell Antu, MD [facility] Phone Number: [phone] 03/27/2023 2:50 PM

## 2023-03-27 NOTE — Progress Notes (Signed)
  Progress Note   Patient: Kara Mullins YNW:[id number] DOB: [birth year] DOA: 03/22/2023     5 DOS: the patient was seen and examined on 03/27/2023   Brief hospital course: 71/M with history of hypertension, diastolic CHF, CKD 5, anxiety disorder presented to the ED with worsening shortness of breath and swelling. -Recently hospitalized 5/14-5/19 with CHF exacerbation and AKI/CKD, cardiorenal syndrome, followed by cards and nephrology, diuresed and transition to torsemide 40 Mg twice daily, she also has significant debility and is mostly nonambulatory, bedbound.  Lives with her daughter, compliant with meds, back in the ED with volume overload, hypertensive, BNP 715, hemoglobin 11, creatinine 3.8, chest x-ray with bilateral airspace disease and small to moderate bilateral effusions -Poor response to diuretics, creatinine around 4, nephrology consulting, plan to start HD  06/13 Right IJ tunneled dialysis catheter placement.  Left brachiocephalic arteriovenous fistula creation.   Assessment and Plan: * Acute on chronic diastolic (congestive) heart failure (HCC) Patient continue volume overloaded.   On carvedilol, isosorbide, hydralazine Furosemide 80 mg IV q 8 hrs. Plan for further ultrafiltration through HD Continue blood pressure monitoring.    CKD (chronic kidney disease) stage 5, GFR less than 15 ml/min (HCC) Patient with progressive worsening renal function.  Patient on high doses of furosemide plus metolazone and continue volume overloaded.   Patient had a temporary HD cathter in place and left upper extremity fistula has been created.  Start renal replacement therapy per nephrology recommendations.   Anemia of chronic disease.  Continue with EPO.   Metabolic bone disease, continue with sevelamer.   Essential hypertension Continue blood pressure monitoring. On Carvedilol, hydralazine and isosorbide.   Hyperlipidemia Patient not on statin therapy.   Pancytopenia  (HCC) Follow up cell count.   Obesity (BMI 30-39.9) Calculated BMI is 31.9   Sacral wound Continue local wound care.   Paroxysmal atrial fibrillation (HCC) Rate controlled, patient not on anticoagulation at this point.         Subjective: Patient with no chest pain, dyspnea stable, and continue to have edema at her lower extremities.   Physical Exam: Vitals:   03/27/23 1545 03/27/23 1550 03/27/23 1600 03/27/23 1643  BP: (!) 159/78  139/71 (!) 173/81  Pulse: 66 65 65 68  Resp: 11 12 14 16   Temp:   97.7 F (36.5 C) (!) 97.5 F (36.4 C)  TempSrc:    Oral  SpO2: 100% 100% 98% 97%  Weight:      Height:       Neurology awake and alert ENT with mild pallor Cardiovascular with S1 and S2 present and rhythmic with no gallops Respiratory with mild rales with no wheezing Abdomen with no distention  Data Reviewed:    Family Communication: I spoke with patient's daughter at the bedside, we talked in detail about patient's condition, plan of care and prognosis and all questions were addressed.   Disposition: Status is: Inpatient Remains inpatient appropriate because: renal replacement therapy   Planned Discharge Destination: Home    Author: [name], MD 03/27/2023 5:28 PM  For on call review [email or web address].

## 2023-03-27 NOTE — Assessment & Plan Note (Addendum)
Today patient had renal replacement therapy with ultrafiltration, removed 1,5 L with good toleration.   Continue with carvedilol, isosorbide, hydralazine Transitioned to oral furosemide 80 mg daily.

## 2023-03-27 NOTE — Assessment & Plan Note (Signed)
Continue blood pressure monitoring. On Carvedilol, hydralazine and isosorbide.

## 2023-03-27 NOTE — Hospital Course (Addendum)
71/M with history of hypertension, diastolic CHF, CKD 5, anxiety disorder presented to the ED with worsening shortness of breath and swelling. -Recently hospitalized 5/14-5/19 with CHF exacerbation and AKI/CKD, cardiorenal syndrome, followed by cards and nephrology, diuresed and transition to torsemide 40 Mg twice daily, she also has significant debility and is mostly nonambulatory, bedbound.  Lives with her daughter, compliant with meds, back in the ED with volume overload, hypertensive, BNP 715, hemoglobin 11, creatinine 3.8, chest x-ray with bilateral airspace disease and small to moderate bilateral effusions -Poor response to diuretics, creatinine around 4, nephrology consulting, plan to start HD  06/13 Right IJ tunneled dialysis catheter placement.  Left brachiocephalic arteriovenous fistula creation.  06/14 renal replacement therapy.

## 2023-03-27 NOTE — Progress Notes (Addendum)
  Progress Note    03/27/2023 7:19 AM * No surgery date entered *  Ms. Kara Mullins has CVD V approaching ESRD in need of permanent access She is schedule for fistula creation today with insertion of TDC by Dr. Lenell Antu Discussed surgery with patient and daughter in room and their questions were answered Keep NPO Consent order placed  [facility] [phone] 03/27/2023 7:19 AM  VASCULAR STAFF ADDENDUM: I have independently interviewed and examined the patient. I agree with the above.   Rande Brunt. Lenell Antu, MD [facility] Phone Number: [phone] 03/27/2023 1:03 PM

## 2023-03-27 NOTE — Transfer of Care (Signed)
Immediate Anesthesia Transfer of Care Note  Patient: Kara Mullins  Procedure(s) Performed: LEFT ARM BRACHIOCEPHALIC ARTERIOVENOUS (AV) FISTULA CREATION (Left) INSERTION OF RIGHT INTERNAL JUGULAR TUNNELED DIALYSIS CATHETER (Neck)  Patient Location: PACU  Anesthesia Type:General  Level of Consciousness: lethargic  Airway & Oxygen Therapy: Patient Spontanous Breathing and Patient connected to face mask oxygen  Post-op Assessment: Report given to RN  Post vital signs: Reviewed  Last Vitals:  Vitals Value Taken Time  BP 119/74 03/27/23 1515  Temp    Pulse 66 03/27/23 1518  Resp 10 03/27/23 1518  SpO2 100 % 03/27/23 1518  Vitals shown include unvalidated device data.  Last Pain:  Vitals:   03/27/23 1253  TempSrc:   PainSc: 0-No pain      Patients Stated Pain Goal: 0 (03/27/23 1253)  Complications: No notable events documented.

## 2023-03-27 NOTE — Assessment & Plan Note (Signed)
Continue local wound care.  

## 2023-03-27 NOTE — Assessment & Plan Note (Signed)
Patient not on statin therapy 

## 2023-03-27 NOTE — Progress Notes (Signed)
OT Cancellation Note  Patient Details Name: Sonny Poth MRN: [id number] DOB: [birth year]   Cancelled Treatment:    Reason Eval/Treat Not Completed: (P) Patient at procedure or test/ unavailable  Alexis Goodell 03/27/2023, 3:21 PM

## 2023-03-27 NOTE — Discharge Instructions (Addendum)
[facility]  Discharge Instructions  AV Fistula or Graft Surgery for Dialysis Access  Please refer to the following instructions for your post-procedure care. Your surgeon or physician assistant will discuss any changes with you.  Activity  You may drive the day following your surgery, if you are comfortable and no longer taking prescription pain medication. Resume full activity as the soreness in your incision resolves.  Bathing/Showering  You may shower after you go home. Keep your incision dry for 48 hours. Do not soak in a bathtub, hot tub, or swim until the incision heals completely. You may not shower if you have a hemodialysis catheter.  Incision Care  Clean your incision with mild soap and water after 48 hours. Pat the area dry with a clean towel. You do not need a bandage unless otherwise instructed. Do not apply any ointments or creams to your incision. You may have skin glue on your incision. Do not peel it off. It will come off on its own in about one week. Your arm may swell a bit after surgery. To reduce swelling use pillows to elevate your arm so it is above your heart. Your doctor will tell you if you need to lightly wrap your arm with an ACE bandage.  Diet  Resume your normal diet. There are not special food restrictions following this procedure. In order to heal from your surgery, it is CRITICAL to get adequate nutrition. Your body requires vitamins, minerals, and protein. Vegetables are the best source of vitamins and minerals. Vegetables also provide the perfect balance of protein. Processed food has little nutritional value, so try to avoid this.  Medications  Resume taking all of your medications. If your incision is causing pain, you may take over-the counter pain relievers such as acetaminophen (Tylenol). If you were prescribed a stronger pain medication, please be aware these medications can cause nausea and constipation. Prevent  nausea by taking the medication with a snack or meal. Avoid constipation by drinking plenty of fluids and eating foods with high amount of fiber, such as fruits, vegetables, and grains.  Do not take Tylenol if you are taking prescription pain medications.  Follow up Your surgeon may want to see you in the office following your access surgery. If so, this will be arranged at the time of your surgery.  Please call us immediately for any of the following conditions:  Increased pain, redness, drainage (pus) from your incision site Fever of 101 degrees or higher Severe or worsening pain at your incision site Hand pain or numbness.  Reduce your risk of vascular disease:  Stop smoking. If you would like help, call [facility] at 1-800-QUIT-NOW (4797020393) or [place] at [phone]  Manage your cholesterol Maintain a desired weight Control your diabetes Keep your blood pressure down  Dialysis  It will take several weeks to several months for your new dialysis access to be ready for use. Your surgeon will determine when it is okay to use it. Your nephrologist will continue to direct your dialysis. You can continue to use your Permcath until your new access is ready for use.   03/27/2023 Kara Mullins [id number] [birth year]  Surgeon(s): [name], MD  Procedure(s): LEFT ARM BRACHIOCEPHALIC ARTERIOVENOUS (AV) FISTULA CREATION INSERTION OF TUNNELED DIALYSIS CATHETER   May stick graft immediately   May stick graft on designated area only:   x Do not stick fistula for 12 weeks    If you have any questions, please  call the office at [phone].

## 2023-03-27 NOTE — Progress Notes (Signed)
PT Cancellation Note  Patient Details Name: Kara Mullins MRN: [id number] DOB: June 03, 1951   Cancelled Treatment:    Reason Eval/Treat Not Completed: Patient at procedure or test/unavailable (Pt off the floor at procedure. Will try again later if time allows)   Gladys Damme 03/27/2023, 12:39 PM

## 2023-03-27 NOTE — Progress Notes (Addendum)
Rounding Note    Patient Name: Kara Mullins Date of Encounter: 03/27/2023  [facility] Cardiologist: [name], MD   Subjective   Has no complaints but appears very lethargic and fatigued.  Denies any chest pain or shortness of breath.  On 3 L via nasal cannula, appears oxygen levels transiently declined last night.  Inpatient Medications    Scheduled Meds:  carvedilol  6.25 mg Oral BID WC   Chlorhexidine Gluconate Cloth  6 each Topical Daily   Chlorhexidine Gluconate Cloth  6 each Topical Q0600   darbepoetin (ARANESP) injection - NON-DIALYSIS  100 mcg Subcutaneous Q Wed-1800   folic acid  1 mg Oral Daily   furosemide  80 mg Intravenous TID   heparin  5,000 Units Subcutaneous Q8H   hydrALAZINE  37.5 mg Oral Q8H   isosorbide mononitrate  120 mg Oral Daily   metolazone  5 mg Oral Daily   sevelamer carbonate  1,600 mg Oral TID WC   sodium chloride flush  3 mL Intravenous Q12H   Continuous Infusions:  PRN Meds: acetaminophen **OR** acetaminophen, albuterol, hydrALAZINE, Muscle Rub   Vital Signs    Vitals:   03/26/23 2024 03/27/23 0013 03/27/23 0509 03/27/23 0758  BP: (!) 145/70 (!) 155/115 (!) 150/62 (!) 160/76  Pulse: 61 70 65 66  Resp: 12 13 11 17   Temp: 98.1 F (36.7 C) 98 F (36.7 C) 98.1 F (36.7 C) 98.3 F (36.8 C)  TempSrc: Oral Oral Oral Oral  SpO2: 96% 93% 100% 100%  Weight:   86.7 kg   Height:        Intake/Output Summary (Last 24 hours) at 03/27/2023 0941 Last data filed at 03/27/2023 0510 Gross per 24 hour  Intake 240 ml  Output 1750 ml  Net -1510 ml      03/27/2023    5:09 AM 03/26/2023    5:23 AM 03/25/2023    4:14 AM  Last 3 Weights  Weight (lbs) 191 lb 2.2 oz 191 lb 9.3 oz 191 lb 9.3 oz  Weight (kg) 86.7 kg 86.9 kg 86.9 kg      Telemetry    Normal sinus rhythm heart rate in the 60s- Personally Reviewed  ECG    No new tracings- Personally Reviewed  Physical Exam   GEN: fatigued Neck: + JVD Cardiac: RRR,  no murmurs, rubs, or gallops.  Respiratory: Clear to auscultation bilaterally. GI: Soft, nontender, non-distended  MS: Severe 3+ pitting edema.  Has more left-sided edema.   Neuro:  Nonfocal  Psych: Normal affect   Labs    High Sensitivity Troponin:   Recent Labs  Lab 02/25/23 1226 02/25/23 1403 03/22/23 0050 03/22/23 0248  TROPONINIHS 23* 38* 9 9     Chemistry Recent Labs  Lab 03/23/23 0102 03/23/23 0105 03/24/23 0050 03/25/23 0100 03/25/23 1212 03/26/23 0012 03/27/23 0015  NA  --    < > 135 136  --  138 136  K  --    < > 3.5 4.1  --  4.1 4.2  CL  --    < > 104 103  --  105 103  CO2  --    < > 21* 23  --  24 24  GLUCOSE  --    < > 79 91  --  80 78  BUN  --    < > 76* 77*  --  76* 76*  CREATININE  --    < > 4.00* 4.03*  --  4.00*  3.86*  CALCIUM  --    < > 6.7* 7.0*  --  7.0* 7.0*  MG  --   --   --   --  2.6*  --   --   PROT 5.3*  --  5.3*  --   --   --   --   ALBUMIN 1.8*  --  1.8*  --   --  1.9* 1.9*  AST 23  --  26  --   --   --   --   ALT 14  --  14  --   --   --   --   ALKPHOS 73  --  68  --   --   --   --   BILITOT 0.4  --  0.4  --   --   --   --   GFRNONAA  --    < > 11* 11*  --  11* 12*  ANIONGAP  --    < > 10 10  --  9 9   < > = values in this interval not displayed.    Lipids No results for input(s): "CHOL", "TRIG", "HDL", "LABVLDL", "LDLCALC", "CHOLHDL" in the last 168 hours.  Hematology Recent Labs  Lab 03/23/23 0105 03/24/23 0050 03/25/23 0100  WBC 3.4* 2.9* 3.4*  RBC 3.26* 3.03* 3.19*  HGB 9.0* 8.4* 8.8*  HCT 29.5* 28.0* 29.0*  MCV 90.5 92.4 90.9  MCH 27.6 27.7 27.6  MCHC 30.5 30.0 30.3  RDW 16.7* 16.6* 16.3*  PLT 132* 121* 134*   Thyroid  Recent Labs  Lab 03/24/23 0803  TSH 2.488    BNP Recent Labs  Lab 03/22/23 0050  BNP 715.1*    DDimer No results for input(s): "DDIMER" in the last 168 hours.   Radiology    VAS [place] UPPER EXT VEIN MAPPING (PRE-OP AVF)  Result Date: 03/26/2023 UPPER EXTREMITY VEIN MAPPING Patient Name:   LANGLEY VANDERMEULEN  Date of Exam:   03/26/2023 Medical Rec #: [id number]       Accession #:    [id number] Date of Birth: 1951/02/20      Patient Gender: F Patient Age:   72 years Exam Location:  [facility] Procedure:      VAS [place] UPPER EXT VEIN MAPPING (PRE-OP AVF) Referring Phys: JAY PATEL --------------------------------------------------------------------------------  Indications: Pre-access. History: Significant of hypertension, hyperlipidemia, diastolic congestive heart          failure, CKD stage V, and chronic left arm swelling.  Performing Technologist: Marilynne Halsted RDMS, RVT  Examination Guidelines: A complete evaluation includes B-mode imaging, spectral Doppler, color Doppler, and power Doppler as needed of all accessible portions of each vessel. Bilateral testing is considered an integral part of a complete examination. Limited examinations for reoccurring indications may be performed as noted. +-----------------+-------------+----------+----------------+ Right Cephalic   Diameter (cm)Depth (cm)    Findings     +-----------------+-------------+----------+----------------+ Shoulder             0.37                                +-----------------+-------------+----------+----------------+ Prox upper arm       0.37        0.27                    +-----------------+-------------+----------+----------------+ Mid upper arm        0.22  0.14                    +-----------------+-------------+----------+----------------+ Dist upper arm       0.35        0.20      branching     +-----------------+-------------+----------+----------------+ Antecubital fossa    0.39        0.18   partial thrombus +-----------------+-------------+----------+----------------+ Prox forearm         0.33        0.22      branching     +-----------------+-------------+----------+----------------+ Mid forearm          0.31        0.12                     +-----------------+-------------+----------+----------------+ Dist forearm         0.28        0.18                    +-----------------+-------------+----------+----------------+ Wrist                0.29        0.14                    +-----------------+-------------+----------+----------------+ +-----------------+-------------+----------+---------+ Right Basilic    Diameter (cm)Depth (cm)Findings  +-----------------+-------------+----------+---------+ Shoulder             0.68                         +-----------------+-------------+----------+---------+ Prox upper arm       0.56               branching +-----------------+-------------+----------+---------+ Mid upper arm        0.41                         +-----------------+-------------+----------+---------+ Dist upper arm       0.48                         +-----------------+-------------+----------+---------+ Antecubital fossa    0.49                         +-----------------+-------------+----------+---------+ Prox forearm         0.38               branching +-----------------+-------------+----------+---------+ Mid forearm          0.37                         +-----------------+-------------+----------+---------+ Distal forearm       0.30                         +-----------------+-------------+----------+---------+ +-----------------+-------------+----------+-----------------------+ Left Cephalic    Diameter (cm)Depth (cm)       Findings         +-----------------+-------------+----------+-----------------------+ Shoulder             0.39        0.28                           +-----------------+-------------+----------+-----------------------+ Prox upper arm       0.32        0.23                           +-----------------+-------------+----------+-----------------------+  Mid upper arm        0.37        0.29                            +-----------------+-------------+----------+-----------------------+ Dist upper arm       0.45        0.45                           +-----------------+-------------+----------+-----------------------+ Antecubital fossa    0.34        0.70   branching and 0.52/0.49 +-----------------+-------------+----------+-----------------------+ Prox forearm         0.39        1.00                           +-----------------+-------------+----------+-----------------------+ Mid forearm          0.35        1.00                           +-----------------+-------------+----------+-----------------------+ Dist forearm         0.29        0.37                           +-----------------+-------------+----------+-----------------------+ Wrist                0.24        0.30                           +-----------------+-------------+----------+-----------------------+ +-----------------+-------------+----------+--------+ Left Basilic     Diameter (cm)Depth (cm)Findings +-----------------+-------------+----------+--------+ Shoulder             0.58                        +-----------------+-------------+----------+--------+ Prox upper arm       0.58                        +-----------------+-------------+----------+--------+ Mid upper arm        0.45                        +-----------------+-------------+----------+--------+ Dist upper arm       0.43                        +-----------------+-------------+----------+--------+ Antecubital fossa    0.49                        +-----------------+-------------+----------+--------+ Prox forearm         0.29                        +-----------------+-------------+----------+--------+ Mid forearm          0.28                        +-----------------+-------------+----------+--------+ Distal forearm       0.25                        +-----------------+-------------+----------+--------+ Moderate edema throughout  left arm. *See table(s) above for measurements  and observations.  Diagnosing physician: Heath Lark Electronically signed by Heath Lark on 03/26/2023 at 4:57:12 PM.    Final     Cardiac Studies   Echocardiogram 03/23/2023 1. Left ventricular ejection fraction, by estimation, is 45 to 50%. The  left ventricle has mildly decreased function. The left ventricle  demonstrates global hypokinesis.   2. Right ventricular systolic function is normal. The right ventricular  size is normal. There is moderately elevated pulmonary artery systolic  pressure.   3. Large pleural effusion in the left lateral region.   4. Mild mitral valve regurgitation.   5. The tricuspid valve is abnormal. Tricuspid valve regurgitation is  mild.   6. The aortic valve is bicuspid. There is mild calcification of the  aortic valve. There is mild thickening of the aortic valve. Aortic valve  regurgitation is mild.   7. The inferior vena cava is normal in size with <50% respiratory  variability, suggesting right atrial pressure of 8 mmHg.   8. Limited echo to evaluate LV function   Patient Profile     72 y.o. female  with a hx of hypertension, hyperlipidemia, chronic lymphedema, CKD stage IV and chronic combined systolic and diastolic heart failure with improved EF was admitted 03/22/2023 for the evaluation of CHF.  She has significant chronic lymphedema who presented with worsening complaints of shortness of breath.  Has not responded well with diuresis and has plans for dialysis now with nephrology's help.  Assessment & Plan    Heart failure with reduced EF Hypertension EF 45-50% down from 50-55% in May 2024. Left ventricle shows global hypokinesis and moderately elevated pulmonary arterial systolic pressures.  RA pressure 8. Had a large pleural effusion that appears to be chronic.  Diuresis strategies overall have been ineffective.  Still has severe lymphedema in bilateral lower extremities and positive JVD.  Nephrology  on board with plans for dialysis now. Hypoalbuminemia likely contributing. Given severe renal disease GDMT very difficult to titrate.  Currently on hydralazine and nitrates.  Has had some issues with bradycardia so carvedilol has been intermittently held. Continue carvedilol 6.25 mg twice daily, hydralazine 37.5 mg every 8, Imdur 120 mg daily.  Has had labile blood pressures, hoping this improves with dialysis. Patient is bedbound and has not ambulated in over a year. Would be poor candidate for any invasive ischemic evaluation.  Prolonged QT Recent EKG on 03/25/2023 shows QTc of 471.  Continue to avoid QT prolonging medications  End-stage renal disease with plans for dialysis Followed by nephrology now.   For questions or updates, please contact [facility] Please consult [email or web address] for contact info under        Signed, Abagail Kitchens, [name]  03/27/2023, 9:41 AM     Patient seen and examined.  Agree with above documentation.  On exam, patient is alert and oriented, regular rate and rhythm, no murmurs, diminished breath sounds, 2+ bilateral lower extremity edema, + JVD.  Planning to start dialysis per nephrology.  [name], MD

## 2023-03-27 NOTE — Anesthesia Preprocedure Evaluation (Addendum)
Anesthesia Evaluation  Patient identified by MRN, date of birth, ID band Patient awake    Reviewed: Allergy & Precautions, H&P , NPO status , Patient's Chart, lab work & pertinent test results, reviewed documented beta blocker date and time   Airway Mallampati: II  TM Distance: >3 FB Neck ROM: Full    Dental no notable dental hx. (+) Edentulous Upper, Edentulous Lower, Dental Advisory Given   Pulmonary neg pulmonary ROS   Pulmonary exam normal breath sounds clear to auscultation       Cardiovascular hypertension, Pt. on medications and Pt. on home beta blockers +CHF   Rhythm:Regular Rate:Normal     Neuro/Psych   Anxiety     negative neurological ROS     GI/Hepatic negative GI ROS, Neg liver ROS,,,  Endo/Other  Hypothyroidism    Renal/GU Renal InsufficiencyRenal disease  negative genitourinary   Musculoskeletal   Abdominal   Peds  Hematology  (+) Blood dyscrasia, anemia   Anesthesia Other Findings   Reproductive/Obstetrics negative OB ROS                             Anesthesia Physical Anesthesia Plan  ASA: 3  Anesthesia Plan: General   Post-op Pain Management: Tylenol PO (pre-op)*   Induction: Intravenous  PONV Risk Score and Plan: 3 and Propofol infusion, Ondansetron, Treatment may vary due to age or medical condition and TIVA  Airway Management Planned: Oral ETT  Additional Equipment:   Intra-op Plan:   Post-operative Plan:   Informed Consent: I have reviewed the patients History and Physical, chart, labs and discussed the procedure including the risks, benefits and alternatives for the proposed anesthesia with the patient or authorized representative who has indicated his/her understanding and acceptance.   Patient has DNR.  Discussed DNR with patient and Suspend DNR.   Dental advisory given  Plan Discussed with: CRNA  Anesthesia Plan Comments:         Anesthesia Quick Evaluation

## 2023-03-27 NOTE — Assessment & Plan Note (Addendum)
Patient with progressive worsening renal function.  Continue intermittent renal replacement therapy per nephrology recommendations.  BUN today is 51   Anemia of chronic disease.  Continue with EPO.   Metabolic bone disease, continue with sevelamer.   Foley has been removed.

## 2023-03-27 NOTE — Progress Notes (Signed)
   Palliative Medicine Inpatient Follow Up Note HPI: 71/M with history of hypertension, diastolic CHF, CKD 5, anxiety disorder presented to the ED with worsening shortness of breath and swelling. Had a five day admission in mid May of this year for heart failure exacerbation. The PMT has seen Kara Mullins multiple times since 11/2021. At this point we have been asked to get involved for further goals of care conversations and discussions on code status.   Today's Discussion 03/27/2023  *Please note that this is a verbal dictation therefore any spelling or grammatical errors are due to the "[employer] One" system interpretation.  Chart reviewed inclusive of vital signs, progress notes, laboratory results, and diagnostic images.   I met this morning with Kara Mullins and her daughter, Kara Mullins.  We reviewed the plan for dialysis and patients strong desire to live. Created space and opportunity for patient to explore thoughts feelings and fears regarding current medical situation.  She vocalizes knowledge of the decision she is made and peace with that knowledge & whatever may come.   Had made the decision to be a DO NOT RESUSCITATE yesterday.  Questions and concerns addressed/Palliative Support Provided.   Objective Assessment: Vital Signs Vitals:   03/27/23 0509 03/27/23 0758  BP: (!) 150/62 (!) 160/76  Pulse: 65 66  Resp: 11 17  Temp: 98.1 F (36.7 C) 98.3 F (36.8 C)  SpO2: 100% 100%    Intake/Output Summary (Last 24 hours) at 03/27/2023 1027 Last data filed at 03/27/2023 0510 Gross per 24 hour  Intake 240 ml  Output 1750 ml  Net -1510 ml   Last Weight  Most recent update: 03/27/2023  5:10 AM    Weight  86.7 kg (191 lb 2.2 oz)            Gen:  Elderly AA F in NAD HEENT: moist mucous membranes CV: Regular rate and irregular rhythm  PULM:  On RA, breathing is even and nonlabored ABD: soft/nontender  EXT: (+) BLE edema  Neuro: Alert and oriented x3   SUMMARY OF RECOMMENDATIONS    DNAR/DNI   Allow time for outcomes  Plan to start dialysis   Appreciate TOC re-instituting OP Palliative support. [name] is aware and will arrange FU on discharge to create an updated MOST form   PMT will continue to incrementally follow along  Billing based on MDM: Moderate ______________________________________________________________________________________ [name] [place] Palliative Medicine Team Team Cell Phone: [phone] Please utilize secure chat with additional questions, if there is no response within 30 minutes please call the above phone number  Palliative Medicine Team providers are available by phone from 7am to 7pm daily and can be reached through the team cell phone.  Should this patient require assistance outside of these hours, please call the patient's attending physician.

## 2023-03-27 NOTE — Progress Notes (Signed)
Patient ID: Kara Mullins, female   DOB: October 29, 1950, 72 y.o.   MRN: [id number] [facility] Progress Note   Assessment/ Plan:   1.  Volume overload in patient with chronic kidney disease stage V: Urine output remains <2 L / 24 hours on diuretics although kidney function appears stable/slightly better.  Seen yesterday by vascular surgery with plans for fistula creation and TDC placement today to begin dialysis.  Based on her recent medical history including rapid rehospitalization on attempted outpatient diuretic management, I think it is entirely reasonable to begin dialysis at this time for streamlining volume management/azotemia.  I fear that her baseline deconditioning and challenges with mobility will be a barrier to chronic outpatient therapy however, she is not ready to transition to hospice/palliative care measures at this time (which she had previously favored). 2.  Acute exacerbation of combined systolic/diastolic heart failure: With fair urine output and unchanged weight overnight.  Continue metolazone and furosemide until hemodialysis is started. 3.  Anemia: Secondary to chronic disease including chronic kidney disease-iron stores close to replete-started on ESA after IV iron. 4.  Hypertension: Blood pressure marginally elevated and permissive to current escalation of diuresis.  Subjective:   Reports to be feeling a little anxious this morning about upcoming surgery, denies chest pain and has minimal shortness of breath.   Objective:   BP (!) 150/62 (BP Location: Right Arm)   Pulse 65   Temp 98.1 F (36.7 C) (Oral)   Resp 11   Ht 5\' 5"  (1.651 m)   Wt 86.7 kg   SpO2 100%   BMI 31.81 kg/m   Intake/Output Summary (Last 24 hours) at 03/27/2023 0746 Last data filed at 03/27/2023 0510 Gross per 24 hour  Intake 360 ml  Output 1750 ml  Net -1390 ml   Weight change: -0.2 kg  Physical Exam: Gen: Resting comfortably in bed, daughter at bedside. CVS: Pulse regular  rhythm, normal rate, S1 and S2 normal Resp: Diminished breath sounds over bases with fine rales right side.  No rhonchi Abd: Soft, obese, nontender, bowel sounds normal Ext: 3+ pitting edema bilaterally  Imaging: VAS [place] UPPER EXT VEIN MAPPING (PRE-OP AVF)  Result Date: 03/26/2023 UPPER EXTREMITY VEIN MAPPING Patient Name:  Kara Mullins  Date of Exam:   03/26/2023 Medical Rec #: [id number]       Accession #:    [id number] Date of Birth: [birth year]      Patient Gender: F Patient Age:   60 years Exam Location:  [facility] Procedure:      VAS [place] UPPER EXT VEIN MAPPING (PRE-OP AVF) Referring Phys: Tocara Mennen --------------------------------------------------------------------------------  Indications: Pre-access. History: Significant of hypertension, hyperlipidemia, diastolic congestive heart          failure, CKD stage V, and chronic left arm swelling.  Performing Technologist: Marilynne Halsted RDMS, RVT  Examination Guidelines: A complete evaluation includes B-mode imaging, spectral Doppler, color Doppler, and power Doppler as needed of all accessible portions of each vessel. Bilateral testing is considered an integral part of a complete examination. Limited examinations for reoccurring indications may be performed as noted. +-----------------+-------------+----------+----------------+ Right Cephalic   Diameter (cm)Depth (cm)    Findings     +-----------------+-------------+----------+----------------+ Shoulder             0.37                                +-----------------+-------------+----------+----------------+ Prox upper arm  0.37        0.27                    +-----------------+-------------+----------+----------------+ Mid upper arm        0.22        0.14                    +-----------------+-------------+----------+----------------+ Dist upper arm       0.35        0.20      branching     +-----------------+-------------+----------+----------------+  Antecubital fossa    0.39        0.18   partial thrombus +-----------------+-------------+----------+----------------+ Prox forearm         0.33        0.22      branching     +-----------------+-------------+----------+----------------+ Mid forearm          0.31        0.12                    +-----------------+-------------+----------+----------------+ Dist forearm         0.28        0.18                    +-----------------+-------------+----------+----------------+ Wrist                0.29        0.14                    +-----------------+-------------+----------+----------------+ +-----------------+-------------+----------+---------+ Right Basilic    Diameter (cm)Depth (cm)Findings  +-----------------+-------------+----------+---------+ Shoulder             0.68                         +-----------------+-------------+----------+---------+ Prox upper arm       0.56               branching +-----------------+-------------+----------+---------+ Mid upper arm        0.41                         +-----------------+-------------+----------+---------+ Dist upper arm       0.48                         +-----------------+-------------+----------+---------+ Antecubital fossa    0.49                         +-----------------+-------------+----------+---------+ Prox forearm         0.38               branching +-----------------+-------------+----------+---------+ Mid forearm          0.37                         +-----------------+-------------+----------+---------+ Distal forearm       0.30                         +-----------------+-------------+----------+---------+ +-----------------+-------------+----------+-----------------------+ Left Cephalic    Diameter (cm)Depth (cm)       Findings         +-----------------+-------------+----------+-----------------------+ Shoulder             0.39        0.28                            +-----------------+-------------+----------+-----------------------+  Prox upper arm       0.32        0.23                           +-----------------+-------------+----------+-----------------------+ Mid upper arm        0.37        0.29                           +-----------------+-------------+----------+-----------------------+ Dist upper arm       0.45        0.45                           +-----------------+-------------+----------+-----------------------+ Antecubital fossa    0.34        0.70   branching and 0.52/0.49 +-----------------+-------------+----------+-----------------------+ Prox forearm         0.39        1.00                           +-----------------+-------------+----------+-----------------------+ Mid forearm          0.35        1.00                           +-----------------+-------------+----------+-----------------------+ Dist forearm         0.29        0.37                           +-----------------+-------------+----------+-----------------------+ Wrist                0.24        0.30                           +-----------------+-------------+----------+-----------------------+ +-----------------+-------------+----------+--------+ Left Basilic     Diameter (cm)Depth (cm)Findings +-----------------+-------------+----------+--------+ Shoulder             0.58                        +-----------------+-------------+----------+--------+ Prox upper arm       0.58                        +-----------------+-------------+----------+--------+ Mid upper arm        0.45                        +-----------------+-------------+----------+--------+ Dist upper arm       0.43                        +-----------------+-------------+----------+--------+ Antecubital fossa    0.49                        +-----------------+-------------+----------+--------+ Prox forearm         0.29                         +-----------------+-------------+----------+--------+ Mid forearm          0.28                        +-----------------+-------------+----------+--------+  Distal forearm       0.25                        +-----------------+-------------+----------+--------+ Moderate edema throughout left arm. *See table(s) above for measurements and observations.  Diagnosing physician: Heath Lark Electronically signed by Heath Lark on 03/26/2023 at 4:57:12 PM.    Final     Labs: BMET Recent Labs  Lab 03/22/23 0050 03/23/23 0105 03/24/23 0050 03/25/23 0100 03/26/23 0012 03/27/23 0015  NA 137 136 135 136 138 136  K 4.1 3.6 3.5 4.1 4.1 4.2  CL 105 106 104 103 105 103  CO2 21* 22 21* 23 24 24   GLUCOSE 80 66* 79 91 80 78  BUN 78* 76* 76* 77* 76* 76*  CREATININE 3.88* 3.83* 4.00* 4.03* 4.00* 3.86*  CALCIUM 7.1* 6.7* 6.7* 7.0* 7.0* 7.0*  PHOS  --   --   --   --  6.8* 6.5*   CBC Recent Labs  Lab 03/22/23 0050 03/23/23 0105 03/24/23 0050 03/25/23 0100  WBC 3.1* 3.4* 2.9* 3.4*  NEUTROABS 1.9  --   --   --   HGB 11.2* 9.0* 8.4* 8.8*  HCT 36.3 29.5* 28.0* 29.0*  MCV 90.3 90.5 92.4 90.9  PLT 171 132* 121* 134*    Medications:     carvedilol  6.25 mg Oral BID WC   Chlorhexidine Gluconate Cloth  6 each Topical Daily   darbepoetin (ARANESP) injection - NON-DIALYSIS  100 mcg Subcutaneous Q Wed-1800   folic acid  1 mg Oral Daily   furosemide  80 mg Intravenous TID   heparin  5,000 Units Subcutaneous Q8H   hydrALAZINE  37.5 mg Oral Q8H   isosorbide mononitrate  120 mg Oral Daily   metolazone  5 mg Oral Daily   sevelamer carbonate  1,600 mg Oral TID WC   sodium chloride flush  3 mL Intravenous Q12H   Zetta Bills, MD 03/27/2023, 7:46 AM

## 2023-03-28 ENCOUNTER — Encounter (HOSPITAL_COMMUNITY): Payer: Self-pay | Admitting: Vascular Surgery

## 2023-03-28 DIAGNOSIS — N185 Chronic kidney disease, stage 5: Secondary | ICD-10-CM | POA: Diagnosis not present

## 2023-03-28 DIAGNOSIS — I480 Paroxysmal atrial fibrillation: Secondary | ICD-10-CM

## 2023-03-28 DIAGNOSIS — I10 Essential (primary) hypertension: Secondary | ICD-10-CM | POA: Diagnosis not present

## 2023-03-28 DIAGNOSIS — I5033 Acute on chronic diastolic (congestive) heart failure: Secondary | ICD-10-CM | POA: Diagnosis not present

## 2023-03-28 DIAGNOSIS — E785 Hyperlipidemia, unspecified: Secondary | ICD-10-CM | POA: Diagnosis not present

## 2023-03-28 LAB — RENAL FUNCTION PANEL
Albumin: 1.9 g/dL — ABNORMAL LOW (ref 3.5–5.0)
Anion gap: 11 (ref 5–15)
BUN: 79 mg/dL — ABNORMAL HIGH (ref 8–23)
CO2: 22 mmol/L (ref 22–32)
Calcium: 7.5 mg/dL — ABNORMAL LOW (ref 8.9–10.3)
Chloride: 102 mmol/L (ref 98–111)
Creatinine, Ser: 3.93 mg/dL — ABNORMAL HIGH (ref 0.44–1.00)
GFR, Estimated: 12 mL/min — ABNORMAL LOW (ref >60)
Glucose, Bld: 125 mg/dL — ABNORMAL HIGH (ref 70–99)
Phosphorus: 6.8 mg/dL — ABNORMAL HIGH (ref 2.5–4.6)
Potassium: 4.5 mmol/L (ref 3.5–5.1)
Sodium: 135 mmol/L (ref 135–145)

## 2023-03-28 LAB — CBC
HCT: 32.5 % — ABNORMAL LOW (ref 36.0–46.0)
Hemoglobin: 10.1 g/dL — ABNORMAL LOW (ref 12.0–15.0)
MCH: 29.1 pg (ref 26.0–34.0)
MCHC: 31.1 g/dL (ref 30.0–36.0)
MCV: 93.7 fL (ref 80.0–100.0)
Platelets: 139 10*3/uL — ABNORMAL LOW (ref 150–400)
RBC: 3.47 MIL/uL — ABNORMAL LOW (ref 3.87–5.11)
RDW: 16.2 % — ABNORMAL HIGH (ref 11.5–15.5)
WBC: 3.5 10*3/uL — ABNORMAL LOW (ref 4.0–10.5)
nRBC: 0.6 % — ABNORMAL HIGH (ref 0.0–0.2)

## 2023-03-28 LAB — HEPATITIS B SURFACE ANTIBODY, QUANTITATIVE: Hep B S AB Quant (Post): 12.2 m[IU]/mL (ref >9.9)

## 2023-03-28 MED ORDER — ISOSORBIDE MONONITRATE ER 30 MG PO TB24
30.0000 mg | ORAL_TABLET | Freq: Every day | ORAL | Status: DC
  Administered 2023-03-29 – 2023-04-01 (×4): 30 mg via ORAL
  Filled 2023-03-28 (×5): qty 1

## 2023-03-28 MED ORDER — LIDOCAINE HCL (PF) 1 % IJ SOLN: 5.0000 mL | INTRAMUSCULAR | Status: DC | PRN

## 2023-03-28 MED ORDER — LIDOCAINE-PRILOCAINE 2.5-2.5 % EX CREA: 1.0000 | TOPICAL_CREAM | CUTANEOUS | Status: DC | PRN

## 2023-03-28 MED ORDER — FUROSEMIDE 40 MG PO TABS
80.0000 mg | ORAL_TABLET | Freq: Every day | ORAL | Status: DC
  Administered 2023-03-29 – 2023-04-01 (×4): 80 mg via ORAL
  Filled 2023-03-28 (×5): qty 2

## 2023-03-28 MED ORDER — HEPARIN SODIUM (PORCINE) 1000 UNIT/ML IJ SOLN
3200.0000 [IU] | Freq: Once | INTRAMUSCULAR | Status: AC
  Administered 2023-03-28: 3200 [IU]

## 2023-03-28 MED ORDER — PENTAFLUOROPROP-TETRAFLUOROETH EX AERO: 1.0000 | INHALATION_SPRAY | CUTANEOUS | Status: DC | PRN

## 2023-03-28 MED ORDER — HYDRALAZINE HCL 25 MG PO TABS
25.0000 mg | ORAL_TABLET | Freq: Three times a day (TID) | ORAL | Status: DC
  Administered 2023-03-28 – 2023-04-01 (×10): 25 mg via ORAL
  Filled 2023-03-28 (×13): qty 1

## 2023-03-28 MED ORDER — HEPARIN SODIUM (PORCINE) 1000 UNIT/ML IJ SOLN
INTRAMUSCULAR | Status: AC
  Filled 2023-03-28: qty 4

## 2023-03-28 NOTE — Progress Notes (Signed)
Patient ID: Kara Mullins, female   DOB: Dec 02, 1950, 72 y.o.   MRN: [id number] [facility] Progress Note   Assessment/ Plan:   1.  End-stage renal disease following volume overload in patient with chronic kidney disease stage V: Urine output remains <2 L / 24 hours on diuretics although kidney function appears stable/slightly better.  Yesterday underwent creation of left BCF and placement of right IJ TDC by vascular surgery (much appreciated).  Will start hemodialysis today for better volume control as previous efforts at medical management resulted in frequent hospitalization in the setting of her advanced chronic kidney disease.  I will begin the process for outpatient dialysis unit placement and she will need to demonstrate ability to sit in a recliner for at least 4 hours prior to discharge. 2.  Acute exacerbation of combined systolic/diastolic heart failure: With fair urine output and unchanged weight overnight.  Continue metolazone and furosemide for now and discontinue after dialysis started. 3.  Anemia: Secondary to chronic disease including chronic kidney disease-iron stores close to replete-started on ESA after IV iron. 4.  Hypertension: Blood pressure elevated and anticipate to improve with ultrafiltration on hemodialysis..  Subjective:   Without acute events overnight after creation of left BCF/placement of right IJ TDC.   Objective:   BP (!) 158/75 (BP Location: Right Arm)   Pulse 70   Temp (!) 97.5 F (36.4 C) (Oral)   Resp 16   Ht 5\' 5"  (1.651 m)   Wt 86.7 kg   SpO2 98%   BMI 31.81 kg/m   Intake/Output Summary (Last 24 hours) at 03/28/2023 0814 Last data filed at 03/28/2023 0505 Gross per 24 hour  Intake 500 ml  Output 1670 ml  Net -1170 ml   Weight change: 0.3 kg  Physical Exam: Gen: Sitting up comfortably in bed eating breakfast.  Daughter assisting bedside. CVS: Pulse regular rhythm, normal rate, S1 and S2 normal Resp: Poor inspiratory effort with  decreased breath sounds over bases.  No rales/rhonchi.  Right IJ TDC Abd: Soft, obese, nontender, bowel sounds normal Ext: 3+ pitting edema bilaterally.  Left VCF with palpable thrill  Imaging: DG CHEST PORT 1 VIEW  Result Date: 03/27/2023 CLINICAL DATA:  Dialysis catheter insertion EXAM: PORTABLE CHEST 1 VIEW COMPARISON:  Chest radiograph 03/22/2023 FINDINGS: There is new right-sided dialysis catheter in place terminating in the region of the cavoatrial junction. The heart is enlarged, unchanged. The upper mediastinal contours are stable, with calcified plaque in the thoracic aorta. Moderate-sized bilateral pleural effusions with bibasilar airspace opacity are likely not significantly changed compared to the prior study, allowing for slight differences in positioning and redistribution. There is likely mild pulmonary interstitial edema. There is no pneumothorax There is no acute osseous abnormality. IMPRESSION: 1. New right-sided dialysis catheter in place terminating in the region of the cavoatrial junction. 2. Bilateral pleural effusions with adjacent airspace opacity, and mild pulmonary interstitial edema, likely not significantly changed since the study from 03/22/2023. Electronically Signed   By: Lesia Hausen M.D.   On: 03/27/2023 16:34   DG C-Arm 1-60 Min-No Report  Result Date: 03/27/2023 Fluoroscopy was utilized by the requesting physician.  No radiographic interpretation.   VAS [place] UPPER EXT VEIN MAPPING (PRE-OP AVF)  Result Date: 03/26/2023 UPPER EXTREMITY VEIN MAPPING Patient Name:  Kara Mullins  Date of Exam:   03/26/2023 Medical Rec #: [id number]       Accession #:    [id number] Date of Birth: 09/10/1951      Patient Gender:  F Patient Age:   14 years Exam Location:  [facility] Procedure:      VAS [place] UPPER EXT VEIN MAPPING (PRE-OP AVF) Referring Phys: Cline Draheim --------------------------------------------------------------------------------  Indications: Pre-access. History:  Significant of hypertension, hyperlipidemia, diastolic congestive heart          failure, CKD stage V, and chronic left arm swelling.  Performing Technologist: Marilynne Halsted RDMS, RVT  Examination Guidelines: A complete evaluation includes B-mode imaging, spectral Doppler, color Doppler, and power Doppler as needed of all accessible portions of each vessel. Bilateral testing is considered an integral part of a complete examination. Limited examinations for reoccurring indications may be performed as noted. +-----------------+-------------+----------+----------------+ Right Cephalic   Diameter (cm)Depth (cm)    Findings     +-----------------+-------------+----------+----------------+ Shoulder             0.37                                +-----------------+-------------+----------+----------------+ Prox upper arm       0.37        0.27                    +-----------------+-------------+----------+----------------+ Mid upper arm        0.22        0.14                    +-----------------+-------------+----------+----------------+ Dist upper arm       0.35        0.20      branching     +-----------------+-------------+----------+----------------+ Antecubital fossa    0.39        0.18   partial thrombus +-----------------+-------------+----------+----------------+ Prox forearm         0.33        0.22      branching     +-----------------+-------------+----------+----------------+ Mid forearm          0.31        0.12                    +-----------------+-------------+----------+----------------+ Dist forearm         0.28        0.18                    +-----------------+-------------+----------+----------------+ Wrist                0.29        0.14                    +-----------------+-------------+----------+----------------+ +-----------------+-------------+----------+---------+ Right Basilic    Diameter (cm)Depth (cm)Findings   +-----------------+-------------+----------+---------+ Shoulder             0.68                         +-----------------+-------------+----------+---------+ Prox upper arm       0.56               branching +-----------------+-------------+----------+---------+ Mid upper arm        0.41                         +-----------------+-------------+----------+---------+ Dist upper arm       0.48                         +-----------------+-------------+----------+---------+  Antecubital fossa    0.49                         +-----------------+-------------+----------+---------+ Prox forearm         0.38               branching +-----------------+-------------+----------+---------+ Mid forearm          0.37                         +-----------------+-------------+----------+---------+ Distal forearm       0.30                         +-----------------+-------------+----------+---------+ +-----------------+-------------+----------+-----------------------+ Left Cephalic    Diameter (cm)Depth (cm)       Findings         +-----------------+-------------+----------+-----------------------+ Shoulder             0.39        0.28                           +-----------------+-------------+----------+-----------------------+ Prox upper arm       0.32        0.23                           +-----------------+-------------+----------+-----------------------+ Mid upper arm        0.37        0.29                           +-----------------+-------------+----------+-----------------------+ Dist upper arm       0.45        0.45                           +-----------------+-------------+----------+-----------------------+ Antecubital fossa    0.34        0.70   branching and 0.52/0.49 +-----------------+-------------+----------+-----------------------+ Prox forearm         0.39        1.00                            +-----------------+-------------+----------+-----------------------+ Mid forearm          0.35        1.00                           +-----------------+-------------+----------+-----------------------+ Dist forearm         0.29        0.37                           +-----------------+-------------+----------+-----------------------+ Wrist                0.24        0.30                           +-----------------+-------------+----------+-----------------------+ +-----------------+-------------+----------+--------+ Left Basilic     Diameter (cm)Depth (cm)Findings +-----------------+-------------+----------+--------+ Shoulder             0.58                        +-----------------+-------------+----------+--------+  Prox upper arm       0.58                        +-----------------+-------------+----------+--------+ Mid upper arm        0.45                        +-----------------+-------------+----------+--------+ Dist upper arm       0.43                        +-----------------+-------------+----------+--------+ Antecubital fossa    0.49                        +-----------------+-------------+----------+--------+ Prox forearm         0.29                        +-----------------+-------------+----------+--------+ Mid forearm          0.28                        +-----------------+-------------+----------+--------+ Distal forearm       0.25                        +-----------------+-------------+----------+--------+ Moderate edema throughout left arm. *See table(s) above for measurements and observations.  Diagnosing physician: Heath Lark Electronically signed by Heath Lark on 03/26/2023 at 4:57:12 PM.    Final     Labs: BMET Recent Labs  Lab 03/22/23 0050 03/23/23 0105 03/24/23 0050 03/25/23 0100 03/26/23 0012 03/27/23 0015 03/28/23 0054  NA 137 136 135 136 138 136 135  K 4.1 3.6 3.5 4.1 4.1 4.2 4.5  CL 105 106 104 103 105 103  102  CO2 21* 22 21* 23 24 24 22   GLUCOSE 80 66* 79 91 80 78 125*  BUN 78* 76* 76* 77* 76* 76* 79*  CREATININE 3.88* 3.83* 4.00* 4.03* 4.00* 3.86* 3.93*  CALCIUM 7.1* 6.7* 6.7* 7.0* 7.0* 7.0* 7.5*  PHOS  --   --   --   --  6.8* 6.5* 6.8*   CBC Recent Labs  Lab 03/22/23 0050 03/23/23 0105 03/24/23 0050 03/25/23 0100  WBC 3.1* 3.4* 2.9* 3.4*  NEUTROABS 1.9  --   --   --   HGB 11.2* 9.0* 8.4* 8.8*  HCT 36.3 29.5* 28.0* 29.0*  MCV 90.3 90.5 92.4 90.9  PLT 171 132* 121* 134*    Medications:     carvedilol  6.25 mg Oral BID WC   Chlorhexidine Gluconate Cloth  6 each Topical Daily   darbepoetin (ARANESP) injection - NON-DIALYSIS  100 mcg Subcutaneous Q Wed-1800   folic acid  1 mg Oral Daily   furosemide  80 mg Intravenous TID   heparin  5,000 Units Subcutaneous Q8H   hydrALAZINE  37.5 mg Oral Q8H   hydrocortisone cream   Topical BID   isosorbide mononitrate  120 mg Oral Daily   metolazone  5 mg Oral Daily   sevelamer carbonate  1,600 mg Oral TID WC   sodium chloride flush  3 mL Intravenous Q12H   Zetta Bills, MD 03/28/2023, 8:14 AM

## 2023-03-28 NOTE — Anesthesia Postprocedure Evaluation (Signed)
Anesthesia Post Note  Patient: Kara Mullins  Procedure(s) Performed: LEFT ARM BRACHIOCEPHALIC ARTERIOVENOUS (AV) FISTULA CREATION (Left) INSERTION OF RIGHT INTERNAL JUGULAR TUNNELED DIALYSIS CATHETER (Neck)     Patient location during evaluation: Other Anesthesia Type: General Level of consciousness: awake and alert Pain management: pain level controlled Vital Signs Assessment: post-procedure vital signs reviewed and stable Respiratory status: spontaneous breathing, nonlabored ventilation, respiratory function stable and patient connected to nasal cannula oxygen Cardiovascular status: blood pressure returned to baseline and stable Postop Assessment: no apparent nausea or vomiting Anesthetic complications: no  No notable events documented.  Last Vitals:  Vitals:   03/28/23 0922 03/28/23 0930  BP: 125/77 126/71  Pulse: 72 71  Resp: 12 (!) 0  Temp:    SpO2: 99% 99%    Last Pain:  Vitals:   03/28/23 0904  TempSrc:   PainSc: 3                  Berdell Hostetler,W. EDMOND

## 2023-03-28 NOTE — Procedures (Signed)
Patient seen on Hemodialysis. BP 132/72   Pulse 68   Temp (!) 97.5 F (36.4 C) (Oral)   Resp 11   Ht 5\' 5"  (1.651 m)   Wt 88.4 kg Comment: BED  SpO2 99%   BMI 32.43 kg/m   QB 200, UF goal 1.5L Tolerating treatment without complaints at this time.   Zetta Bills MD [facility]. Office # [phone] Pager # [phone] 10:28 AM

## 2023-03-28 NOTE — Progress Notes (Signed)
OT Cancellation Note  Patient Details Name: Kara Mullins MRN: [id number] DOB: 10/16/1950   Cancelled Treatment:    Reason Eval/Treat Not Completed: Other (comment).  Placed on bedpan.  Bethzaida Boord D Charlisa Cham 03/28/2023, 4:16 PM 03/28/2023  RP, OTR/L  Acute Rehabilitation Services  Office:  [phone]

## 2023-03-28 NOTE — Progress Notes (Signed)
   Palliative Medicine Inpatient Follow Up Note HPI: 71/M with history of hypertension, diastolic CHF, CKD 5, anxiety disorder presented to the ED with worsening shortness of breath and swelling. Had a five day admission in mid May of this year for heart failure exacerbation. The PMT has seen Kara Mullins multiple times since 11/2021. At this point we have been asked to get involved for further goals of care conversations and discussions on code status.   Today's Discussion 03/28/2023  *Please note that this is a verbal dictation therefore any spelling or grammatical errors are due to the "Dragon Medical One" system interpretation.  Chart reviewed inclusive of vital signs, progress notes, laboratory results, and diagnostic images.   I met this afternoon with Kara Mullins after her dialysis treatment. She shares that she is feeling well at this time. Denies fatigue, nausea, or dyspnea at the time of assessment.   [name] shares that she feels optimistic about dialysis treatments and how they may help her heart failure and fluid build up moving forward. She shares the hope for ongoing improvements.   Questions and concerns addressed/Palliative Support Provided.   Objective Assessment: Vital Signs Vitals:   03/28/23 1129 03/28/23 1137  BP: (!) 148/72 (!) 146/71  Pulse: 66 67  Resp: 10 10  Temp:    SpO2: 100% 100%    Intake/Output Summary (Last 24 hours) at 03/28/2023 1416 Last data filed at 03/28/2023 1153 Gross per 24 hour  Intake 620 ml  Output 2370 ml  Net -1750 ml    Last Weight  Most recent update: 03/28/2023 11:41 AM    Weight  86.1 kg (189 lb 13.1 oz)            Gen:  Elderly AA F in NAD HEENT: moist mucous membranes CV: Regular rate and irregular rhythm  PULM:  On RA, breathing is even and nonlabored ABD: soft/nontender  EXT: (+) BLE edema  Neuro: Alert and oriented x3   SUMMARY OF RECOMMENDATIONS   DNAR/DNI   Allow time for outcomes - patient goals remain to be towards  improvement(s)  Started on dialysis   Appreciate TOC re-instituting OP Palliative support. [name] is aware and will arrange FU on discharge to create an updated MOST form   PMT will continue to incrementally follow along  Billing based on MDM: Moderate ______________________________________________________________________________________ [name] [place] Palliative Medicine Team Team Cell Phone: [phone] Please utilize secure chat with additional questions, if there is no response within 30 minutes please call the above phone number  Palliative Medicine Team providers are available by phone from 7am to 7pm daily and can be reached through the team cell phone.  Should this patient require assistance outside of these hours, please call the patient's attending physician.

## 2023-03-28 NOTE — Progress Notes (Addendum)
Rounding Note    Patient Name: Kara Mullins Date of Encounter: 03/28/2023  [facility] Cardiologist: [name], MD   Subjective   Has no complaints continues to appear lethargic and fatigued.  Denies any chest pain or shortness of breath. Current in HD  Inpatient Medications    Scheduled Meds:  carvedilol  6.25 mg Oral BID WC   Chlorhexidine Gluconate Cloth  6 each Topical Daily   darbepoetin (ARANESP) injection - NON-DIALYSIS  100 mcg Subcutaneous Q Wed-1800   folic acid  1 mg Oral Daily   furosemide  80 mg Intravenous TID   heparin  5,000 Units Subcutaneous Q8H   hydrALAZINE  37.5 mg Oral Q8H   hydrocortisone cream   Topical BID   isosorbide mononitrate  120 mg Oral Daily   metolazone  5 mg Oral Daily   sevelamer carbonate  1,600 mg Oral TID WC   sodium chloride flush  3 mL Intravenous Q12H   Continuous Infusions:  PRN Meds: acetaminophen **OR** acetaminophen, albuterol, hydrALAZINE, lidocaine (PF), lidocaine-prilocaine, Muscle Rub, pentafluoroprop-tetrafluoroeth   Vital Signs    Vitals:   03/28/23 0501 03/28/23 0759 03/28/23 0901 03/28/23 0922  BP: (!) 158/75 (!) 152/79 116/66 125/77  Pulse: 70 72 76 72  Resp: 16 13 12 12   Temp: (!) 97.5 F (36.4 C)  (!) 97.5 F (36.4 C)   TempSrc: Oral  Oral   SpO2: 98% 100% 98% 99%  Weight: 86.7 kg  88.4 kg   Height:        Intake/Output Summary (Last 24 hours) at 03/28/2023 0929 Last data filed at 03/28/2023 0505 Gross per 24 hour  Intake 500 ml  Output 1670 ml  Net -1170 ml       03/28/2023    9:01 AM 03/28/2023    5:01 AM 03/27/2023   12:33 PM  Last 3 Weights  Weight (lbs) 194 lb 14.2 oz 191 lb 2.2 oz 191 lb 12.8 oz  Weight (kg) 88.4 kg 86.7 kg 87 kg      Telemetry    Normal sinus rhythm heart rate in the 60s- Personally Reviewed  ECG    No new tracings- Personally Reviewed  Physical Exam   GEN: fatigued Neck: + JVD Cardiac: RRR, no murmurs, rubs, or gallops.  Respiratory:  Clear to auscultation bilaterally. GI: Soft, nontender, non-distended  MS: Severe 2+ pitting edema.  Has more left-sided edema.   Neuro:  Nonfocal  Psych: Normal affect   Labs    High Sensitivity Troponin:   Recent Labs  Lab 03/22/23 0050 03/22/23 0248  TROPONINIHS 9 9      Chemistry Recent Labs  Lab 03/23/23 0102 03/23/23 0105 03/24/23 0050 03/25/23 0100 03/25/23 1212 03/26/23 0012 03/27/23 0015 03/28/23 0054  NA  --    < > 135   < >  --  138 136 135  K  --    < > 3.5   < >  --  4.1 4.2 4.5  CL  --    < > 104   < >  --  105 103 102  CO2  --    < > 21*   < >  --  24 24 22   GLUCOSE  --    < > 79   < >  --  80 78 125*  BUN  --    < > 76*   < >  --  76* 76* 79*  CREATININE  --    < >  4.00*   < >  --  4.00* 3.86* 3.93*  CALCIUM  --    < > 6.7*   < >  --  7.0* 7.0* 7.5*  MG  --   --   --   --  2.6*  --   --   --   PROT 5.3*  --  5.3*  --   --   --   --   --   ALBUMIN 1.8*  --  1.8*  --   --  1.9* 1.9* 1.9*  AST 23  --  26  --   --   --   --   --   ALT 14  --  14  --   --   --   --   --   ALKPHOS 73  --  68  --   --   --   --   --   BILITOT 0.4  --  0.4  --   --   --   --   --   GFRNONAA  --    < > 11*   < >  --  11* 12* 12*  ANIONGAP  --    < > 10   < >  --  9 9 11    < > = values in this interval not displayed.     Lipids No results for input(s): "CHOL", "TRIG", "HDL", "LABVLDL", "LDLCALC", "CHOLHDL" in the last 168 hours.  Hematology Recent Labs  Lab 03/23/23 0105 03/24/23 0050 03/25/23 0100  WBC 3.4* 2.9* 3.4*  RBC 3.26* 3.03* 3.19*  HGB 9.0* 8.4* 8.8*  HCT 29.5* 28.0* 29.0*  MCV 90.5 92.4 90.9  MCH 27.6 27.7 27.6  MCHC 30.5 30.0 30.3  RDW 16.7* 16.6* 16.3*  PLT 132* 121* 134*    Thyroid  Recent Labs  Lab 03/24/23 0803  TSH 2.488     BNP Recent Labs  Lab 03/22/23 0050  BNP 715.1*     DDimer No results for input(s): "DDIMER" in the last 168 hours.   Radiology    DG CHEST PORT 1 VIEW  Result Date: 03/27/2023 CLINICAL DATA:  Dialysis  catheter insertion EXAM: PORTABLE CHEST 1 VIEW COMPARISON:  Chest radiograph 03/22/2023 FINDINGS: There is new right-sided dialysis catheter in place terminating in the region of the cavoatrial junction. The heart is enlarged, unchanged. The upper mediastinal contours are stable, with calcified plaque in the thoracic aorta. Moderate-sized bilateral pleural effusions with bibasilar airspace opacity are likely not significantly changed compared to the prior study, allowing for slight differences in positioning and redistribution. There is likely mild pulmonary interstitial edema. There is no pneumothorax There is no acute osseous abnormality. IMPRESSION: 1. New right-sided dialysis catheter in place terminating in the region of the cavoatrial junction. 2. Bilateral pleural effusions with adjacent airspace opacity, and mild pulmonary interstitial edema, likely not significantly changed since the study from 03/22/2023. Electronically Signed   By: Lesia Hausen M.D.   On: 03/27/2023 16:34   DG C-Arm 1-60 Min-No Report  Result Date: 03/27/2023 Fluoroscopy was utilized by the requesting physician.  No radiographic interpretation.   VAS [place] UPPER EXT VEIN MAPPING (PRE-OP AVF)  Result Date: 03/26/2023 UPPER EXTREMITY VEIN MAPPING Patient Name:  Kara Mullins  Date of Exam:   03/26/2023 Medical Rec #: [id number]       Accession #:    [id number] Date of Birth: 14-Jun-1951      Patient Gender: F Patient Age:   72 years  Exam Location:  [facility] Procedure:      VAS [place] UPPER EXT VEIN MAPPING (PRE-OP AVF) Referring Phys: JAY PATEL --------------------------------------------------------------------------------  Indications: Pre-access. History: Significant of hypertension, hyperlipidemia, diastolic congestive heart          failure, CKD stage V, and chronic left arm swelling.  Performing Technologist: Marilynne Halsted RDMS, RVT  Examination Guidelines: A complete evaluation includes B-mode imaging, spectral Doppler,  color Doppler, and power Doppler as needed of all accessible portions of each vessel. Bilateral testing is considered an integral part of a complete examination. Limited examinations for reoccurring indications may be performed as noted. +-----------------+-------------+----------+----------------+ Right Cephalic   Diameter (cm)Depth (cm)    Findings     +-----------------+-------------+----------+----------------+ Shoulder             0.37                                +-----------------+-------------+----------+----------------+ Prox upper arm       0.37        0.27                    +-----------------+-------------+----------+----------------+ Mid upper arm        0.22        0.14                    +-----------------+-------------+----------+----------------+ Dist upper arm       0.35        0.20      branching     +-----------------+-------------+----------+----------------+ Antecubital fossa    0.39        0.18   partial thrombus +-----------------+-------------+----------+----------------+ Prox forearm         0.33        0.22      branching     +-----------------+-------------+----------+----------------+ Mid forearm          0.31        0.12                    +-----------------+-------------+----------+----------------+ Dist forearm         0.28        0.18                    +-----------------+-------------+----------+----------------+ Wrist                0.29        0.14                    +-----------------+-------------+----------+----------------+ +-----------------+-------------+----------+---------+ Right Basilic    Diameter (cm)Depth (cm)Findings  +-----------------+-------------+----------+---------+ Shoulder             0.68                         +-----------------+-------------+----------+---------+ Prox upper arm       0.56               branching +-----------------+-------------+----------+---------+ Mid upper arm         0.41                         +-----------------+-------------+----------+---------+ Dist upper arm       0.48                         +-----------------+-------------+----------+---------+ Antecubital fossa  0.49                         +-----------------+-------------+----------+---------+ Prox forearm         0.38               branching +-----------------+-------------+----------+---------+ Mid forearm          0.37                         +-----------------+-------------+----------+---------+ Distal forearm       0.30                         +-----------------+-------------+----------+---------+ +-----------------+-------------+----------+-----------------------+ Left Cephalic    Diameter (cm)Depth (cm)       Findings         +-----------------+-------------+----------+-----------------------+ Shoulder             0.39        0.28                           +-----------------+-------------+----------+-----------------------+ Prox upper arm       0.32        0.23                           +-----------------+-------------+----------+-----------------------+ Mid upper arm        0.37        0.29                           +-----------------+-------------+----------+-----------------------+ Dist upper arm       0.45        0.45                           +-----------------+-------------+----------+-----------------------+ Antecubital fossa    0.34        0.70   branching and 0.52/0.49 +-----------------+-------------+----------+-----------------------+ Prox forearm         0.39        1.00                           +-----------------+-------------+----------+-----------------------+ Mid forearm          0.35        1.00                           +-----------------+-------------+----------+-----------------------+ Dist forearm         0.29        0.37                           +-----------------+-------------+----------+-----------------------+  Wrist                0.24        0.30                           +-----------------+-------------+----------+-----------------------+ +-----------------+-------------+----------+--------+ Left Basilic     Diameter (cm)Depth (cm)Findings +-----------------+-------------+----------+--------+ Shoulder             0.58                        +-----------------+-------------+----------+--------+ Prox upper arm  0.58                        +-----------------+-------------+----------+--------+ Mid upper arm        0.45                        +-----------------+-------------+----------+--------+ Dist upper arm       0.43                        +-----------------+-------------+----------+--------+ Antecubital fossa    0.49                        +-----------------+-------------+----------+--------+ Prox forearm         0.29                        +-----------------+-------------+----------+--------+ Mid forearm          0.28                        +-----------------+-------------+----------+--------+ Distal forearm       0.25                        +-----------------+-------------+----------+--------+ Moderate edema throughout left arm. *See table(s) above for measurements and observations.  Diagnosing physician: Heath Lark Electronically signed by Heath Lark on 03/26/2023 at 4:57:12 PM.    Final     Cardiac Studies   Echocardiogram 03/23/2023 1. Left ventricular ejection fraction, by estimation, is 45 to 50%. The  left ventricle has mildly decreased function. The left ventricle  demonstrates global hypokinesis.   2. Right ventricular systolic function is normal. The right ventricular  size is normal. There is moderately elevated pulmonary artery systolic  pressure.   3. Large pleural effusion in the left lateral region.   4. Mild mitral valve regurgitation.   5. The tricuspid valve is abnormal. Tricuspid valve regurgitation is  mild.   6. The aortic  valve is bicuspid. There is mild calcification of the  aortic valve. There is mild thickening of the aortic valve. Aortic valve  regurgitation is mild.   7. The inferior vena cava is normal in size with <50% respiratory  variability, suggesting right atrial pressure of 8 mmHg.   8. Limited echo to evaluate LV function   Patient Profile     72 y.o. female  with a hx of hypertension, hyperlipidemia, chronic lymphedema, CKD stage IV and chronic combined systolic and diastolic heart failure with improved EF was admitted 03/22/2023 for the evaluation of CHF.  She has significant chronic lymphedema who presented with worsening complaints of shortness of breath.  Has not responded well with diuresis and has plans for dialysis now with nephrology's help.  Assessment & Plan    Heart failure with reduced EF Hypertension EF 45-50% down from 50-55% in May 2024. Left ventricle shows global hypokinesis and moderately elevated pulmonary arterial systolic pressures.  RA pressure 8. Had a large pleural effusion that appears to be chronic.  Diuresis strategies overall have been ineffective.  Still has severe lymphedema in bilateral lower extremities and positive JVD.  Undergoing HD today.  Hypoalbuminemia likely contributing.  Continue carvedilol 6.25 mg twice daily, hydralazine 25 mg TID mg, Imdur 30 mg daily.  No ACE/ARB/Arni given renal disease Has had labile blood pressures, hoping this improves with dialysis. Patient is bedbound and has  not ambulated in over a year. Would be poor candidate for any invasive ischemic evaluation.  Prolonged QT Recent EKG on 03/25/2023 shows QTc of 471.  Continue to avoid QT prolonging medications  End-stage renal disease with plans for dialysis today  Followed by nephrology now.  [facility] will sign off.   Medication Recommendations: Carvedilol 6.25 mg twice daily, hydralazine 25 mg 3 times daily, Imdur 30 mg daily Other recommendations (labs, testing, etc):  None Follow up as an outpatient: We will schedule   For questions or updates, please contact [facility] Please consult [email or web address] for contact info under   Signed, Abagail Kitchens, [name]  03/28/2023, 9:29 AM     Patient seen and examined.  Agree with above documentation.  On exam, patient is alert and oriented, regular rate and rhythm, no murmurs, lungs CTAB, 2+ LE edema.  Starting hemodialysis today.  Suspect BP will improve with starting dialysis.  Appears her Imdur dose was increased to 120 mg daily, would decrease back to 30 mg daily.  Cardiology will sign off, we will schedule follow-up.  [name], MD

## 2023-03-28 NOTE — Progress Notes (Addendum)
  Progress Note   Patient: Kara Mullins ZOX:[id number] DOB: Jul 24, 1951 DOA: 03/22/2023     6 DOS: the patient was seen and examined on 03/28/2023   Brief hospital course: 71/M with history of hypertension, diastolic CHF, CKD 5, anxiety disorder presented to the ED with worsening shortness of breath and swelling. -Recently hospitalized 5/14-5/19 with CHF exacerbation and AKI/CKD, cardiorenal syndrome, followed by cards and nephrology, diuresed and transition to torsemide 40 Mg twice daily, she also has significant debility and is mostly nonambulatory, bedbound.  Lives with her daughter, compliant with meds, back in the ED with volume overload, hypertensive, BNP 715, hemoglobin 11, creatinine 3.8, chest x-ray with bilateral airspace disease and small to moderate bilateral effusions -Poor response to diuretics, creatinine around 4, nephrology consulting, plan to start HD  06/13 Right IJ tunneled dialysis catheter placement.  Left brachiocephalic arteriovenous fistula creation.  06/14 renal replacement therapy.   Assessment and Plan: * Acute on chronic diastolic (congestive) heart failure (HCC) Today patient had renal replacement therapy with ultrafiltration, removed 1,5 L with good toleration.   Continue with carvedilol, isosorbide, hydralazine Transitioned to oral furosemide 80 mg daily.    CKD (chronic kidney disease) stage 5, GFR less than 15 ml/min (HCC) Patient with progressive worsening renal function.   Patient had HD with ultrafiltration (1.5 L) with good toleration.   Anemia of chronic disease.  Continue with EPO.   Metabolic bone disease, continue with sevelamer.   Traumatic bleeding at foley site, will remove foley catheter at this point to prevent further injury.  Essential hypertension Continue blood pressure monitoring. On Carvedilol, hydralazine and isosorbide.   Hyperlipidemia Patient not on statin therapy.   Pancytopenia (HCC) Follow up cell count.   Obesity  (BMI 30-39.9) Calculated BMI is 31.9   Sacral wound Continue local wound care.   Paroxysmal atrial fibrillation (HCC) Rate controlled, patient not on anticoagulation at this point.         Subjective: patient is feeling better, dyspnea and edema have improved, but not back to baseline.   Physical Exam: Vitals:   03/28/23 1129 03/28/23 1137 03/28/23 1141 03/28/23 1502  BP: (!) 148/72 (!) 146/71  134/72  Pulse: 66 67  68  Resp: 10 10  15   Temp:    (!) 97.5 F (36.4 C)  TempSrc:    Oral  SpO2: 100% 100%  100%  Weight:   86.1 kg   Height:       Neurology awake and alert ENT with mild pallor Cardiovascular with S1 and S2 present and rhythmic with no gallops. Respiratory with no rales or wheezing Abdomen with no distention Positive lower extremity edema ++  Data Reviewed:    Family Communication: I spoke with patient's daughter at the bedside, we talked in detail about patient's condition, plan of care and prognosis and all questions were addressed.   Disposition: Status is: Inpatient Remains inpatient appropriate because: inpatient renal replacement therapy   Planned Discharge Destination: Home    Author: [name], MD 03/28/2023 3:24 PM  For on call review [email or web address].

## 2023-03-28 NOTE — Progress Notes (Addendum)
Pt educated on not to get HD catheter wet no showers. Also how to check for thrill and bruit on AVF access.Educated on not sleeping on AVF or carry heavy purses or groceries. Stethoscope provided for pt to listen to access.

## 2023-03-28 NOTE — Progress Notes (Signed)
Received patient in bed to unit.  Alert and oriented.  Informed consent signed and in chart.   TX duration:2  Patient tolerated well.  Transported back to the room  Alert, without acute distress.  Hand-off given to patient's nurse.   Access used: right [facility] Access issues: none  Total UF removed: 1.5L Medication(s) given: none   03/28/23 1129  Vitals  BP (!) 148/72  MAP (mmHg) 93  BP Location Right Arm  BP Method Automatic  Patient Position (if appropriate) Lying  Pulse Rate 66  Pulse Rate Source Monitor  ECG Heart Rate 67  Resp 10  Oxygen Therapy  SpO2 100 %  O2 Device Nasal Cannula  O2 Flow Rate (L/min) 2 L/min  During Treatment Monitoring  HD Safety Checks Performed Yes  Intra-Hemodialysis Comments Tx completed  Dialysis Fluid Bolus Normal Saline  Bolus Amount (mL) 300 mL     Jahfari Ambers S [facility]

## 2023-03-28 NOTE — Progress Notes (Signed)
PT Cancellation Note  Patient Details Name: Kara Mullins MRN: [id number] DOB: 23-Aug-1951   Cancelled Treatment:    Reason Eval/Treat Not Completed: Patient at procedure or test/unavailable (Pt off the floor at dialysis. Will try again later if time allows.)   Gladys Damme 03/28/2023, 8:57 AM

## 2023-03-28 NOTE — Progress Notes (Signed)
      Left AC incision healing well, no hematoma or drainage R TDC in place dressing clean and dry Plan for HD today  Plan for f/u with [facility] in 5-6 weeks with duplex and check for maturity of fistula.  Do not stick fistula for 3 months  Stable post op   Mosetta Pigeon [name]

## 2023-03-28 NOTE — Progress Notes (Signed)
Requested to see pt for out-pt HD needs at d/c. Spoke to pt's daughter, [name], via phone. Introduced self and explained role. Discussed out-pt HD options. Daughter prefers for pt to continue seeing Dr Malen Gauze if possible. Referral submitted to [facility] with request for Berenice Primas. Daughter plans to transport pt to/from HD at d/c. Daughter advised that pt will need to be able to demonstrate the ability to sit in a recliner for at least 4 hrs prior to discharge. Daughter voices understanding. Will assist as needed.   Olivia Canter Renal Navigator [phone]

## 2023-03-29 DIAGNOSIS — I5033 Acute on chronic diastolic (congestive) heart failure: Secondary | ICD-10-CM | POA: Diagnosis not present

## 2023-03-29 DIAGNOSIS — E669 Obesity, unspecified: Secondary | ICD-10-CM

## 2023-03-29 DIAGNOSIS — N185 Chronic kidney disease, stage 5: Secondary | ICD-10-CM | POA: Diagnosis not present

## 2023-03-29 DIAGNOSIS — I10 Essential (primary) hypertension: Secondary | ICD-10-CM | POA: Diagnosis not present

## 2023-03-29 DIAGNOSIS — E785 Hyperlipidemia, unspecified: Secondary | ICD-10-CM | POA: Diagnosis not present

## 2023-03-29 LAB — RENAL FUNCTION PANEL
Albumin: 2 g/dL — ABNORMAL LOW (ref 3.5–5.0)
Anion gap: 11 (ref 5–15)
BUN: 67 mg/dL — ABNORMAL HIGH (ref 8–23)
CO2: 24 mmol/L (ref 22–32)
Calcium: 7.2 mg/dL — ABNORMAL LOW (ref 8.9–10.3)
Chloride: 100 mmol/L (ref 98–111)
Creatinine, Ser: 3.57 mg/dL — ABNORMAL HIGH (ref 0.44–1.00)
GFR, Estimated: 13 mL/min — ABNORMAL LOW (ref >60)
Glucose, Bld: 87 mg/dL (ref 70–99)
Phosphorus: 6.3 mg/dL — ABNORMAL HIGH (ref 2.5–4.6)
Potassium: 4 mmol/L (ref 3.5–5.1)
Sodium: 135 mmol/L (ref 135–145)

## 2023-03-29 LAB — CBC
HCT: 28.7 % — ABNORMAL LOW (ref 36.0–46.0)
Hemoglobin: 8.9 g/dL — ABNORMAL LOW (ref 12.0–15.0)
MCH: 28.3 pg (ref 26.0–34.0)
MCHC: 31 g/dL (ref 30.0–36.0)
MCV: 91.4 fL (ref 80.0–100.0)
Platelets: 118 10*3/uL — ABNORMAL LOW (ref 150–400)
RBC: 3.14 MIL/uL — ABNORMAL LOW (ref 3.87–5.11)
RDW: 16.6 % — ABNORMAL HIGH (ref 11.5–15.5)
WBC: 4.9 10*3/uL (ref 4.0–10.5)
nRBC: 0 % (ref 0.0–0.2)

## 2023-03-29 MED ORDER — HEPARIN SODIUM (PORCINE) 1000 UNIT/ML DIALYSIS: 40.0000 [IU]/kg | INTRAMUSCULAR | Status: DC | PRN

## 2023-03-29 MED ORDER — HEPARIN SODIUM (PORCINE) 1000 UNIT/ML IJ SOLN
3200.0000 [IU] | Freq: Once | INTRAMUSCULAR | Status: AC
  Administered 2023-03-29: 3200 [IU] via INTRAVENOUS
  Filled 2023-03-29: qty 4

## 2023-03-29 NOTE — Procedures (Signed)
Patient seen on Hemodialysis. BP (!) 149/73 (BP Location: Right Arm)   Pulse 64   Temp 97.6 F (36.4 C) (Oral)   Resp 11   Ht 5\' 5"  (1.651 m)   Wt 86.4 kg Comment: BED  SpO2 98%   BMI 31.70 kg/m   QB 250, UF goal 2.5L Tolerating treatment without complaints at this time.   Zetta Bills MD [facility]. Office # [phone] Pager # [phone] 9:30 AM

## 2023-03-29 NOTE — Progress Notes (Signed)
   Palliative Medicine Inpatient Follow Up Note HPI: 71/M with history of hypertension, diastolic CHF, CKD 5, anxiety disorder presented to the ED with worsening shortness of breath and swelling. Had a five day admission in mid May of this year for heart failure exacerbation. The PMT has seen Kara Mullins multiple times since 11/2021. At this point we have been asked to get involved for further goals of care conversations and discussions on code status.   Today's Discussion 03/29/2023  *Please note that this is a verbal dictation therefore any spelling or grammatical errors are due to the "Dragon Medical One" system interpretation.  Chart reviewed inclusive of vital signs, progress notes, laboratory results, and diagnostic images.   I met this early morning with Kara Mullins. She was in good spirits and does feel fairly well after having started dialysis. Kara Mullins denies pain, nausea, or shortness of breath. She does endorse that she is feeling quite hungry this morning. I was able to set up her breakfast accordingly.  We reviewed the plan for another dialysis treatment to day. I shared with Kara Mullins the importance of sitting up for each treatment as this will be essential for her to be able to discharging. She expresses that this seems like a daunting task though she is willing to try. I shared if there is anything we can do to increase her comfort in advance whether it be medications to supplement possible pain or anxiety that we can pursue that. Right now she would like to see how she does without such measures.    Questions and concerns addressed/Palliative Support Provided.   Objective Assessment: Vital Signs Vitals:   03/29/23 0911 03/29/23 0930  BP: (!) 149/73 (!) 173/80  Pulse: 64 65  Resp: 11 17  Temp:    SpO2: 98% 100%    Intake/Output Summary (Last 24 hours) at 03/29/2023 0949 Last data filed at 03/29/2023 0500 Gross per 24 hour  Intake 120 ml  Output 1775 ml  Net -1655 ml    Last Weight   Most recent update: 03/29/2023  9:05 AM    Weight  86.4 kg (190 lb 7.6 oz)            Gen:  Elderly AA F in NAD HEENT: moist mucous membranes CV: Regular rate and irregular rhythm  PULM:  On RA, breathing is even and nonlabored ABD: soft/nontender  EXT: (+) BLE edema  Neuro: Alert and oriented x3   SUMMARY OF RECOMMENDATIONS   DNAR/DNI   Allow time for outcomes - patient goals remain to be towards improvement(s)  Started on dialysis - tolerating well thus far though needs to start sitting up for treatments   Appreciate TOC re-instituting OP Palliative support. Kara Mullins is aware and will arrange FU on discharge to create an updated MOST form   PMT will continue to incrementally follow along  Billing based on MDM: Moderate ______________________________________________________________________________________ [name] [place] Palliative Medicine Team Team Cell Phone: [phone] Please utilize secure chat with additional questions, if there is no response within 30 minutes please call the above phone number  Palliative Medicine Team providers are available by phone from 7am to 7pm daily and can be reached through the team cell phone.  Should this patient require assistance outside of these hours, please call the patient's attending physician.

## 2023-03-29 NOTE — Progress Notes (Signed)
  Progress Note   Patient: Kara Mullins WUJ:[id number] DOB: 1951-04-14 DOA: 03/22/2023     7 DOS: the patient was seen and examined on 03/29/2023   Brief hospital course: 71/M with history of hypertension, diastolic CHF, CKD 5, anxiety disorder presented to the ED with worsening shortness of breath and swelling. -Recently hospitalized 5/14-5/19 with CHF exacerbation and AKI/CKD, cardiorenal syndrome, followed by cards and nephrology, diuresed and transition to torsemide 40 Mg twice daily, she also has significant debility and is mostly nonambulatory, bedbound.  Lives with her daughter, compliant with meds, back in the ED with volume overload, hypertensive, BNP 715, hemoglobin 11, creatinine 3.8, chest x-ray with bilateral airspace disease and small to moderate bilateral effusions -Poor response to diuretics, creatinine around 4, nephrology consulting, plan to start HD  06/13 Right IJ tunneled dialysis catheter placement.  Left brachiocephalic arteriovenous fistula creation.  06/14 renal replacement therapy.   Assessment and Plan: * Acute on chronic diastolic (congestive) heart failure (HCC) Patient had HD today with good toleration, ultrafiltration 2,5 L.  BUN pre HD was 67.   Continue with carvedilol, isosorbide, hydralazine Oral furosemide 80 mg daily.    CKD (chronic kidney disease) stage 5, GFR less than 15 ml/min (HCC) Patient with progressive worsening renal function.   Patient had HD with ultrafiltration (2.5 L) with good toleration.   Anemia of chronic disease.  Continue with EPO.   Metabolic bone disease, continue with sevelamer.   Foley has been removed.   Essential hypertension Continue blood pressure monitoring. On Carvedilol, hydralazine and isosorbide.   Hyperlipidemia Patient not on statin therapy.   Pancytopenia (HCC) Follow up cell count.   Obesity (BMI 30-39.9) Calculated BMI is 31.9   Sacral wound Continue local wound care.   Paroxysmal atrial  fibrillation (HCC) Rate controlled, patient not on anticoagulation at this point.         Subjective: Patient with improvement in edema and dyspnea, no chest pain   Physical Exam: Vitals:   03/29/23 1130 03/29/23 1147 03/29/23 1153 03/29/23 1156  BP: (!) 158/75 (!) 173/82 (!) 175/81   Pulse: 62 62 64   Resp: 15 10 13    Temp:  (!) 97.4 F (36.3 C)    TempSrc:  Oral    SpO2: 98% 98% 97%   Weight:    83.3 kg  Height:       Neurology awake and alert ENT with mild pallor Cardiovascular with S1 and S2 present and rhythmic with no gallops Respiratory with no rales or wheezing Abdomen with no distention  Positive lower extremity edema ++ pitting  Data Reviewed:    Family Communication: no family at the bedside   Disposition: Status is: Inpatient Remains inpatient appropriate because: volume overload, renal failure, will need outpatient HD unit   Planned Discharge Destination: Home    Author: [name], MD 03/29/2023 1:24 PM  For on call review [email or web address].

## 2023-03-29 NOTE — Progress Notes (Signed)
Patient ID: Kara Mullins, female   DOB: Dec 16, 1950, 72 y.o.   MRN: [id number] [facility] Progress Note   Assessment/ Plan:   1.  End-stage renal disease following volume overload in patient with chronic kidney disease stage V: Started on hemodialysis yesterday after failure to adequately control volume with medical measures in the setting of advancing chronic kidney disease; this was done after much discussion because the patient had previously made the decision not to pursue any forms of renal replacement therapy but changed her mind after palliative care service involved.  She underwent creation of left BCF and placement of right IJ TDC by vascular surgery on 6/13 (much appreciated).  She will have her second hemodialysis treatment today and the third on Monday (which will be done in a recliner). 2.  Acute exacerbation of combined systolic/diastolic heart failure: With fair urine output and unchanged weight overnight.  Metolazone discontinued and will leave her on furosemide to help continue augment UOP. 3.  Anemia: Secondary to chronic disease including chronic kidney disease-iron stores close to replete-started on ESA after IV iron. 4.  Hypertension: Blood pressure elevated and anticipate to improve with ultrafiltration on hemodialysis..  Subjective:   Tolerated her first dialysis treatment yesterday without problems.  In better spirits this morning.   Objective:   BP (!) 154/74 (BP Location: Right Arm)   Pulse 64   Temp 97.6 F (36.4 C) (Oral)   Resp 19   Ht 5\' 5"  (1.651 m)   Wt 85.8 kg   SpO2 99%   BMI 31.48 kg/m   Intake/Output Summary (Last 24 hours) at 03/29/2023 0835 Last data filed at 03/29/2023 0500 Gross per 24 hour  Intake 120 ml  Output 1775 ml  Net -1655 ml   Weight change: 1.4 kg  Physical Exam: Gen: Resting comfortably in bed propped up CVS: Pulse regular rhythm, normal rate, S1 and S2 normal Resp: Poor inspiratory effort with decreased breath  sounds over bases.  No rales/rhonchi.  Right IJ TDC Abd: Soft, obese, nontender, bowel sounds normal Ext: 2-3+ pitting edema bilaterally.  Left BCF with palpable thrill and intact surgical scar  Imaging: DG CHEST PORT 1 VIEW  Result Date: 03/27/2023 CLINICAL DATA:  Dialysis catheter insertion EXAM: PORTABLE CHEST 1 VIEW COMPARISON:  Chest radiograph 03/22/2023 FINDINGS: There is new right-sided dialysis catheter in place terminating in the region of the cavoatrial junction. The heart is enlarged, unchanged. The upper mediastinal contours are stable, with calcified plaque in the thoracic aorta. Moderate-sized bilateral pleural effusions with bibasilar airspace opacity are likely not significantly changed compared to the prior study, allowing for slight differences in positioning and redistribution. There is likely mild pulmonary interstitial edema. There is no pneumothorax There is no acute osseous abnormality. IMPRESSION: 1. New right-sided dialysis catheter in place terminating in the region of the cavoatrial junction. 2. Bilateral pleural effusions with adjacent airspace opacity, and mild pulmonary interstitial edema, likely not significantly changed since the study from 03/22/2023. Electronically Signed   By: Lesia Hausen M.D.   On: 03/27/2023 16:34   DG C-Arm 1-60 Min-No Report  Result Date: 03/27/2023 Fluoroscopy was utilized by the requesting physician.  No radiographic interpretation.    Labs: BMET Recent Labs  Lab 03/23/23 0105 03/24/23 0050 03/25/23 0100 03/26/23 0012 03/27/23 0015 03/28/23 0054 03/29/23 0656  NA 136 135 136 138 136 135 135  K 3.6 3.5 4.1 4.1 4.2 4.5 4.0  CL 106 104 103 105 103 102 100  CO2 22 21* 23  24 24 22 24   GLUCOSE 66* 79 91 80 78 125* 87  BUN 76* 76* 77* 76* 76* 79* 67*  CREATININE 3.83* 4.00* 4.03* 4.00* 3.86* 3.93* 3.57*  CALCIUM 6.7* 6.7* 7.0* 7.0* 7.0* 7.5* 7.2*  PHOS  --   --   --  6.8* 6.5* 6.8* 6.3*   CBC Recent Labs  Lab 03/24/23 0050  03/25/23 0100 03/28/23 0730 03/29/23 0656  WBC 2.9* 3.4* 3.5* 4.9  HGB 8.4* 8.8* 10.1* 8.9*  HCT 28.0* 29.0* 32.5* 28.7*  MCV 92.4 90.9 93.7 91.4  PLT 121* 134* 139* 118*    Medications:     carvedilol  6.25 mg Oral BID WC   Chlorhexidine Gluconate Cloth  6 each Topical Daily   darbepoetin (ARANESP) injection - NON-DIALYSIS  100 mcg Subcutaneous Q Wed-1800   folic acid  1 mg Oral Daily   furosemide  80 mg Oral Daily   heparin  5,000 Units Subcutaneous Q8H   hydrALAZINE  25 mg Oral Q8H   hydrocortisone cream   Topical BID   isosorbide mononitrate  30 mg Oral Daily   sevelamer carbonate  1,600 mg Oral TID WC   sodium chloride flush  3 mL Intravenous Q12H   Zetta Bills, MD 03/29/2023, 8:35 AM

## 2023-03-29 NOTE — Plan of Care (Signed)
  Problem: Education: Goal: Ability to demonstrate management of disease process will improve Outcome: Progressing Goal: Ability to verbalize understanding of medication therapies will improve Outcome: Progressing Goal: Individualized Educational Video(s) Outcome: Progressing   Problem: Activity: Goal: Capacity to carry out activities will improve Outcome: Not Progressing   Problem: Cardiac: Goal: Ability to achieve and maintain adequate cardiopulmonary perfusion will improve Outcome: Progressing   Problem: Education: Goal: Knowledge of General Education information will improve Description: Including pain rating scale, medication(s)/side effects and non-pharmacologic comfort measures Outcome: Progressing   Problem: Health Behavior/Discharge Planning: Goal: Ability to manage health-related needs will improve Outcome: Not Progressing   Problem: Clinical Measurements: Goal: Ability to maintain clinical measurements within normal limits will improve Outcome: Progressing Goal: Will remain free from infection Outcome: Progressing Goal: Diagnostic test results will improve Outcome: Progressing Goal: Respiratory complications will improve Outcome: Progressing Goal: Cardiovascular complication will be avoided Outcome: Progressing   Problem: Activity: Goal: Risk for activity intolerance will decrease Outcome: Progressing   Problem: Nutrition: Goal: Adequate nutrition will be maintained Outcome: Progressing   Problem: Coping: Goal: Level of anxiety will decrease Outcome: Progressing   Problem: Elimination: Goal: Will not experience complications related to bowel motility Outcome: Progressing Goal: Will not experience complications related to urinary retention Outcome: Progressing   Problem: Pain Managment: Goal: General experience of comfort will improve Outcome: Progressing   Problem: Safety: Goal: Ability to remain free from injury will improve Outcome:  Progressing   Problem: Skin Integrity: Goal: Risk for impaired skin integrity will decrease Outcome: Progressing

## 2023-03-29 NOTE — Progress Notes (Signed)
PT Cancellation Note  Patient Details Name: Kara Mullins MRN: [id number] DOB: May 28, 1951   Cancelled Treatment:    Reason Eval/Treat Not Completed: Patient at procedure or test/unavailable (HD). PT will continue to follow-up with pt acutely as available and appropriate.    Alessandra Bevels Rawleigh Rode 03/29/2023, 9:28 AM

## 2023-03-29 NOTE — Progress Notes (Signed)
Received patient in bed to unit.  Alert and oriented.  Informed consent signed and in chart.   TX duration:2.5  Patient tolerated well.  Transported back to the room  Alert, without acute distress.  Hand-off given to patient's nurse.   Access used: right [facility] Access issues: none  Total UF removed: 2.5L Medication(s) given: none   03/29/23 1147  Vitals  Temp (!) 97.4 F (36.3 C)  Temp Source Oral  BP (!) 173/82  MAP (mmHg) 107  BP Location Right Arm  BP Method Automatic  Patient Position (if appropriate) Lying  Pulse Rate 62  Pulse Rate Source Monitor  ECG Heart Rate 63  Resp 10  Oxygen Therapy  SpO2 98 %  O2 Device Room Air  During Treatment Monitoring  HD Safety Checks Performed Yes  Intra-Hemodialysis Comments Tx completed;Tolerated well  Dialysis Fluid Bolus Normal Saline  Bolus Amount (mL) 300 mL      Kelie Gainey [facility]

## 2023-03-30 ENCOUNTER — Inpatient Hospital Stay (HOSPITAL_COMMUNITY): Payer: Medicare (Managed Care)

## 2023-03-30 DIAGNOSIS — I10 Essential (primary) hypertension: Secondary | ICD-10-CM | POA: Diagnosis not present

## 2023-03-30 DIAGNOSIS — E785 Hyperlipidemia, unspecified: Secondary | ICD-10-CM | POA: Diagnosis not present

## 2023-03-30 DIAGNOSIS — N185 Chronic kidney disease, stage 5: Secondary | ICD-10-CM | POA: Diagnosis not present

## 2023-03-30 DIAGNOSIS — I5033 Acute on chronic diastolic (congestive) heart failure: Secondary | ICD-10-CM | POA: Diagnosis not present

## 2023-03-30 LAB — RENAL FUNCTION PANEL
Albumin: 1.9 g/dL — ABNORMAL LOW (ref 3.5–5.0)
Anion gap: 9 (ref 5–15)
BUN: 51 mg/dL — ABNORMAL HIGH (ref 8–23)
CO2: 25 mmol/L (ref 22–32)
Calcium: 7 mg/dL — ABNORMAL LOW (ref 8.9–10.3)
Chloride: 99 mmol/L (ref 98–111)
Creatinine, Ser: 2.94 mg/dL — ABNORMAL HIGH (ref 0.44–1.00)
GFR, Estimated: 17 mL/min — ABNORMAL LOW (ref >60)
Glucose, Bld: 86 mg/dL (ref 70–99)
Phosphorus: 4.7 mg/dL — ABNORMAL HIGH (ref 2.5–4.6)
Potassium: 3.6 mmol/L (ref 3.5–5.1)
Sodium: 133 mmol/L — ABNORMAL LOW (ref 135–145)

## 2023-03-30 MED ORDER — IPRATROPIUM-ALBUTEROL 0.5-2.5 (3) MG/3ML IN SOLN
3.0000 mL | RESPIRATORY_TRACT | Status: AC
  Filled 2023-03-30: qty 3

## 2023-03-30 NOTE — Progress Notes (Signed)
Received a call from bedside RN regarding the patient choking on [employer] chips.  Presented at bedside.  Patient's daughter is present in the room.  She states the patient usually wears dentures and thinks it may have caused her to swallow in the wrong pipe.  She ate the [employer] chips without them.  The daughter will bring the dentures today.    Did a water challenge at bedside which she passed.  Per her daughter she has not had any issues with eating solids or drinking fluids within the past few months.  No wheezing or rales noted on exam.  The patient states she swallows fine.  No longer coughing.  Canceled chest x-ray and speech therapist ordered earlier.   Time: 15 minutes.

## 2023-03-30 NOTE — Progress Notes (Signed)
Patient ID: Kara Mullins, female   DOB: 06-Aug-1951, 72 y.o.   MRN: [id number] [facility] Progress Note   Assessment/ Plan:   1.  End-stage renal disease following volume overload in patient with chronic kidney disease stage V: Started on hemodialysis this week after failing adequate volume management/diuresis with advancing chronic kidney disease; this was undertaken after extensive discussion because she had previously made the decision not to pursue any forms of renal replacement therapy but changed her mind after offered transitioning to hospice care.  She underwent creation of left BCF and placement of right IJ TDC by vascular surgery on 6/13 (much appreciated).  She had her second hemodialysis treatment done yesterday and will have her third treatment done tomorrow in a recliner to assess her physical ability to undertake outpatient dialysis.  Continue efforts at mobilization out of bed with assistance from PT. 2.  Acute exacerbation of combined systolic/diastolic heart failure: With fair urine output and unchanged weight overnight.  Metolazone discontinued and will leave her on furosemide to help continue augment UOP. 3.  Anemia: Secondary to chronic disease including chronic kidney disease-iron stores close to replete-started on ESA after IV iron. 4.  Hypertension: Blood pressure elevated and anticipate to improve with ultrafiltration on hemodialysis..  Subjective:   Complains of a dry cough overnight that limited her ability to sleep restfully.   Objective:   BP (!) 151/70 (BP Location: Left Arm)   Pulse 65   Temp 97.9 F (36.6 C) (Oral)   Resp 14   Ht 5\' 5"  (1.651 m)   Wt 87 kg   SpO2 95%   BMI 31.92 kg/m   Intake/Output Summary (Last 24 hours) at 03/30/2023 0836 Last data filed at 03/30/2023 0500 Gross per 24 hour  Intake 200 ml  Output 3150 ml  Net -2950 ml   Weight change: -2 kg  Physical Exam: Gen: Comfortably sitting propped up in bed, daughter at  bedside CVS: Pulse regular rhythm, normal rate, S1 and S2 normal Resp: Diminished breath sounds over both lung bases (poor inspiratory effort).  No rales or wheeze.  Right IJ TDC site with clean dressing Abd: Soft, obese, nontender, bowel sounds normal Ext: 2+ bilateral pitting edema over lower extremities.  Left BCF with palpable thrill and intact surgical scar  Imaging: No results found.  Labs: BMET Recent Labs  Lab 03/24/23 0050 03/25/23 0100 03/26/23 0012 03/27/23 0015 03/28/23 0054 03/29/23 0656 03/30/23 0052  NA 135 136 138 136 135 135 133*  K 3.5 4.1 4.1 4.2 4.5 4.0 3.6  CL 104 103 105 103 102 100 99  CO2 21* 23 24 24 22 24 25   GLUCOSE 79 91 80 78 125* 87 86  BUN 76* 77* 76* 76* 79* 67* 51*  CREATININE 4.00* 4.03* 4.00* 3.86* 3.93* 3.57* 2.94*  CALCIUM 6.7* 7.0* 7.0* 7.0* 7.5* 7.2* 7.0*  PHOS  --   --  6.8* 6.5* 6.8* 6.3* 4.7*   CBC Recent Labs  Lab 03/24/23 0050 03/25/23 0100 03/28/23 0730 03/29/23 0656  WBC 2.9* 3.4* 3.5* 4.9  HGB 8.4* 8.8* 10.1* 8.9*  HCT 28.0* 29.0* 32.5* 28.7*  MCV 92.4 90.9 93.7 91.4  PLT 121* 134* 139* 118*    Medications:     carvedilol  6.25 mg Oral BID WC   Chlorhexidine Gluconate Cloth  6 each Topical Daily   darbepoetin (ARANESP) injection - NON-DIALYSIS  100 mcg Subcutaneous Q Wed-1800   folic acid  1 mg Oral Daily   furosemide  80  mg Oral Daily   heparin  5,000 Units Subcutaneous Q8H   hydrALAZINE  25 mg Oral Q8H   hydrocortisone cream   Topical BID   ipratropium-albuterol  3 mL Nebulization STAT   isosorbide mononitrate  30 mg Oral Daily   sevelamer carbonate  1,600 mg Oral TID WC   sodium chloride flush  3 mL Intravenous Q12H   Zetta Bills, MD 03/30/2023, 8:36 AM

## 2023-03-30 NOTE — Progress Notes (Signed)
   Palliative Medicine Inpatient Follow Up Note HPI: 71/M with history of hypertension, diastolic CHF, CKD 5, anxiety disorder presented to the ED with worsening shortness of breath and swelling. Had a five day admission in mid May of this year for heart failure exacerbation. The PMT has seen Kara Mullins multiple times since 11/2021. At this point we have been asked to get involved for further goals of care conversations and discussions on code status.   Today's Discussion 03/30/2023  *Please note that this is a verbal dictation therefore any spelling or grammatical errors are due to the "Dragon Medical One" system interpretation.  Chart reviewed inclusive of vital signs, progress notes, laboratory results, and diagnostic images.   I went to bedside this morning to assess Kara Mullins, she was noted to be resting in NAD. I spoke with her daughter, Kara Mullins at bedside. We discussed Kara Mullins's present health state. We reviewed that as of now she seems to be tolerating dialysis well. I shared the plan for Kara Mullins to sit up in the chair tomorrow to see how she tolerates her tx.   Kara Mullins shares how resilient her mother is. We discussed her strength as a woman. Questions and concerns addressed/Palliative Support Provided.   Objective Assessment: Vital Signs Vitals:   03/30/23 0741 03/30/23 0742  BP:  (!) 151/70  Pulse: 65 65  Resp:  14  Temp:  97.9 F (36.6 C)  SpO2: 95% 95%    Intake/Output Summary (Last 24 hours) at 03/30/2023 0911 Last data filed at 03/30/2023 0500 Gross per 24 hour  Intake 120 ml  Output 3150 ml  Net -3030 ml    Last Weight  Most recent update: 03/30/2023  4:04 AM    Weight  87 kg (191 lb 12.8 oz)            Gen:  Elderly AA F in NAD HEENT: moist mucous membranes CV: Regular rate and irregular rhythm  PULM:  On RA, breathing is even and nonlabored ABD: soft/nontender  EXT: (+) BLE edema  Neuro: Alert and oriented x3   SUMMARY OF RECOMMENDATIONS   DNAR/DNI   Allow time  for outcomes - patient goals remain to be towards improvement(s)  Started on dialysis - tolerating well thus far though needs to start sitting up for treatments   Appreciate TOC re-instituting OP Palliative support. Kara Mullins is aware and will arrange FU on discharge to create an updated MOST form   PMT will continue to incrementally follow along   Billing based on MDM: Moderate ______________________________________________________________________________________ Kara Mullins Palliative Medicine Team Team Cell Phone: [phone] Please utilize secure chat with additional questions, if there is no response within 30 minutes please call the above phone number  Palliative Medicine Team providers are available by phone from 7am to 7pm daily and can be reached through the team cell phone.  Should this patient require assistance outside of these hours, please call the patient's attending physician.

## 2023-03-30 NOTE — Progress Notes (Signed)
  Progress Note   Patient: Kara Mullins ZOX:[id number] DOB: Feb 16, 1951 DOA: 03/22/2023     8 DOS: the patient was seen and examined on 03/30/2023   Brief hospital course: 71/M with history of hypertension, diastolic CHF, CKD 5, anxiety disorder presented to the ED with worsening shortness of breath and swelling. -Recently hospitalized 5/14-5/19 with CHF exacerbation and AKI/CKD, cardiorenal syndrome, followed by cards and nephrology, diuresed and transition to torsemide 40 Mg twice daily, she also has significant debility and is mostly nonambulatory, bedbound.  Lives with her daughter, compliant with meds, back in the ED with volume overload, hypertensive, BNP 715, hemoglobin 11, creatinine 3.8, chest x-ray with bilateral airspace disease and small to moderate bilateral effusions -Poor response to diuretics, creatinine around 4, nephrology consulting, plan to start HD  06/13 Right IJ tunneled dialysis catheter placement.  Left brachiocephalic arteriovenous fistula creation.  06/14 renal replacement therapy.   Assessment and Plan: * Acute on chronic diastolic (congestive) heart failure (HCC) Patient has been tolerating HD well.  Continue to have lower extremity edema   Continue with carvedilol, isosorbide, hydralazine Oral furosemide 80 mg daily.    CKD (chronic kidney disease) stage 5, GFR less than 15 ml/min (HCC) Patient with progressive worsening renal function.  Continue intermittent renal replacement therapy per nephrology recommendations.  BUN today is 51   Anemia of chronic disease.  Continue with EPO.   Metabolic bone disease, continue with sevelamer.   Foley has been removed.   Essential hypertension Continue blood pressure monitoring. On Carvedilol, hydralazine and isosorbide.   Hyperlipidemia Patient not on statin therapy.   Pancytopenia (HCC) Follow up cell count.   Obesity (BMI 30-39.9) Calculated BMI is 31.9   Dysphagia  Patient had difficulty swallowing  will check chest radiograph and follow up with speech evaluation.   Sacral wound Continue local wound care.   Paroxysmal atrial fibrillation (HCC) Rate controlled, patient not on anticoagulation at this point.         Subjective: Patient with no chest pain or dyspnea, continue to have lower extremity edema, no HD today. Last nigh had difficulty swallowing  Physical Exam: Vitals:   03/30/23 0036 03/30/23 0403 03/30/23 0741 03/30/23 0742  BP: (!) 152/66 (!) 156/69  (!) 151/70  Pulse:  64 65 65  Resp: 13 12  14   Temp: 97.6 F (36.4 C) 97.6 F (36.4 C)  97.9 F (36.6 C)  TempSrc: Oral Oral  Oral  SpO2:  95% 95% 95%  Weight:  87 kg    Height:       Neurology awake and alert ENT with mild pallor Cardiovascular with S1 and S2 present and rhythmic Respiratory with no rales or wheezing Abdomen with no distention  Positive lower extremity edema +++ pitting bilaterally  Data Reviewed:    Family Communication: no family at the bedside   Disposition: Status is: Inpatient Remains inpatient appropriate because: renal failure, started on intermittent renal replacement therapy.   Planned Discharge Destination: Home      Author: [name], MD 03/30/2023 12:38 PM  For on call review [email or web address].

## 2023-03-30 NOTE — Evaluation (Signed)
Clinical/Bedside Swallow Evaluation Patient Details  Name: Kara Mullins MRN: [id number] Date of Birth: [birth year]  Today's Date: 03/30/2023 Time: SLP Start Time (ACUTE ONLY): 1520 SLP Stop Time (ACUTE ONLY): 1532 SLP Time Calculation (min) (ACUTE ONLY): 12 min  Past Medical History:  Past Medical History:  Diagnosis Date   Chronic combined systolic and diastolic congestive heart failure (HCC) 11/09/2021   Generalized anxiety disorder    Hyperlipidemia 11/09/2021   Hypertension    Stage 3b chronic kidney disease (CKD) (HCC) 11/09/2021   Past Surgical History:  Past Surgical History:  Procedure Laterality Date   AV FISTULA PLACEMENT Left 03/27/2023   Procedure: LEFT ARM BRACHIOCEPHALIC ARTERIOVENOUS (AV) FISTULA CREATION;  Surgeon: [name], MD;  Location: [facility] OR;  Service: Vascular;  Laterality: Left;   CATARACT EXTRACTION, BILATERAL     combined systolic and diastolic congestive heart failure      INSERTION OF DIALYSIS CATHETER N/A 03/27/2023   Procedure: INSERTION OF RIGHT INTERNAL JUGULAR TUNNELED DIALYSIS CATHETER;  Surgeon: [name], MD;  Location: [facility] OR;  Service: Vascular;  Laterality: N/A;   HPI:       Assessment / Plan / Recommendation  Clinical Impression  Patient presents with questionable oropharyngeal dysphagia as per this bedside swallow evaluation. No overt s/s of aspiration observed, however patient reportedly coughed when eating a corn chip earlier today and RN reported she was having problems taking her medications. Patient does have a h/o dysphagia, but as per SLP notes from February of 2023, her symptoms appeared to have resolved while hospitalized. SLP assessed her swallow function with sips of thin liquids (water) and although she exhibited a suspected delayed initiation of swallow, no overt s/s aspiration observed. SLP recommending continue with current PO diet of regular texture foods as long as patient is able to successfully order softer foods  from menu. SLP will plan to f/u at least once to ensure patient is tolerating PO diet. SLP Visit Diagnosis: Dysphagia, unspecified (R13.10)    Aspiration Risk  No limitations    Diet Recommendation Regular;Thin liquid   Liquid Administration via: Cup;Straw Medication Administration: Other (Comment) (as tolerated) Supervision: Patient able to self feed Compensations: Slow rate;Small sips/bites Postural Changes: Seated upright at 90 degrees    Other  Recommendations Oral Care Recommendations: Oral care BID    Recommendations for follow up therapy are one component of a multi-disciplinary discharge planning process, led by the attending physician.  Recommendations may be updated based on patient status, additional functional criteria and insurance authorization.  Follow up Recommendations Follow physician's recommendations for discharge plan and follow up therapies      Assistance Recommended at Discharge    Functional Status Assessment Patient has had a recent decline in their functional status and demonstrates the ability to make significant improvements in function in a reasonable and predictable amount of time.  Frequency and Duration min 1 x/week  1 week       Prognosis Prognosis for improved oropharyngeal function: Good      Swallow Study   General Date of Onset: 03/30/23 Type of Study: Bedside Swallow Evaluation Previous Swallow Assessment: during admission in February of 2022 Diet Prior to this Study: Regular;Thin liquids (Level 0) Temperature Spikes Noted: No Respiratory Status: Room air History of Recent Intubation: No Behavior/Cognition: Alert;Cooperative;Pleasant mood Oral Cavity Assessment: Within Functional Limits Oral Care Completed by SLP: No Oral Cavity - Dentition: Edentulous Vision: Functional for self-feeding Self-Feeding Abilities: Able to feed self Patient Positioning: Upright in bed Baseline Vocal  Quality: Normal Volitional Cough:  Strong Volitional Swallow: Able to elicit    Oral/Motor/Sensory Function Overall Oral Motor/Sensory Function: Within functional limits   Ice Chips     Thin Liquid Thin Liquid: Impaired Pharyngeal  Phase Impairments: Suspected delayed Swallow    Nectar Thick     Honey Thick     Puree Puree: Not tested   Solid     Solid: Not tested     Angela Nevin, MA, CCC-SLP Speech Therapy

## 2023-03-30 NOTE — Evaluation (Signed)
Physical Therapy Evaluation Patient Details Name: Kara Mullins MRN: [id number] DOB: 11-24-1950 Today's Date: 03/30/2023  History of Present Illness  Pt is a 72 y/o F presenting to ED on 6/8 with worsening SOB and edema. Admitted for acute on chronic CHF. R IJ tunneled dialysis catheter placed 6/13. PMHi ncludes HTN, CHF, CKD IV, anxiety.  Clinical Impression  Pt presents with admitting diagnosis above. Co treat with OT. Pt today was able to sit EOB for ~5 with +2 Max A. Pt reports this is baseline for her however nephrology note pt needs to be able to tolerate sitting up in chair for outpatient HD. Recommend pt resume HHPT upon DC. Pt would benefit from transfer training during acute stay.       Recommendations for follow up therapy are one component of a multi-disciplinary discharge planning process, led by the attending physician.  Recommendations may be updated based on patient status, additional functional criteria and insurance authorization.  Follow Up Recommendations       Assistance Recommended at Discharge Frequent or constant Supervision/Assistance  Patient can return home with the following  Two people to help with walking and/or transfers;A lot of help with bathing/dressing/bathroom;Assistance with cooking/housework;Direct supervision/assist for medications management;Assist for transportation;Help with stairs or ramp for entrance    Equipment Recommendations Other (comment) Michiel Sites lift)  Recommendations for Other Services       Functional Status Assessment Patient has had a recent decline in their functional status and demonstrates the ability to make significant improvements in function in a reasonable and predictable amount of time.     Precautions / Restrictions Precautions Precautions: Fall Precaution Comments: pt is nonambulatory Restrictions Weight Bearing Restrictions: No      Mobility  Bed Mobility Overal bed mobility: Needs Assistance Bed Mobility:  Supine to Sit     Supine to sit: Max assist, +2 for physical assistance          Transfers                   General transfer comment: deferred    Ambulation/Gait                  Stairs            Wheelchair Mobility    Modified Rankin (Stroke Patients Only)       Balance Overall balance assessment: Needs assistance Sitting-balance support: Bilateral upper extremity supported Sitting balance-Leahy Scale: Fair                                       Pertinent Vitals/Pain Pain Assessment Pain Assessment: No/denies pain    Home Living Family/patient expects to be discharged to:: Private residence Living Arrangements: Children Available Help at Discharge: Family;Available 24 hours/day Type of Home: House Home Access: Ramped entrance       Home Layout: One level Home Equipment: Rollator (4 wheels);Wheelchair - manual;BSC/3in1;Other (comment);Cane - single point;Hospital bed [facility] Lift) Additional Comments: was sponge bathing, lives with daughter and son in law, daughter is with pt all day, grand daughter is able to assist in evenings, son in law works at night.    Prior Function Prior Level of Function : Needs assist             Mobility Comments: sits EOB ~30 min/day per pt ADLs Comments: daughter assisted with bed level toileting, bathing for LB and dressing, pt performs self feeding  and seated grooming tasks     Hand Dominance   Dominant Hand: Right    Extremity/Trunk Assessment   Upper Extremity Assessment Upper Extremity Assessment: Generalized weakness    Lower Extremity Assessment Lower Extremity Assessment: Generalized weakness    Cervical / Trunk Assessment Cervical / Trunk Assessment: Other exceptions Cervical / Trunk Exceptions: large body habitus  Communication   Communication: No difficulties  Cognition Arousal/Alertness: Awake/alert Behavior During Therapy: Flat affect Overall Cognitive  Status: Within Functional Limits for tasks assessed                                          General Comments General comments (skin integrity, edema, etc.): VSS on RA    Exercises     Assessment/Plan    PT Assessment Patient needs continued PT services  PT Problem List Decreased strength;Decreased range of motion;Decreased activity tolerance;Decreased balance;Decreased mobility;Decreased coordination;Decreased safety awareness;Decreased knowledge of precautions;Cardiopulmonary status limiting activity       PT Treatment Interventions DME instruction;Functional mobility training;Therapeutic activities;Therapeutic exercise;Balance training;Patient/family education;Neuromuscular re-education    PT Goals (Current goals can be found in the Care Plan section)  Acute Rehab PT Goals Patient Stated Goal: to go home PT Goal Formulation: With patient Time For Goal Achievement: 04/13/23 Potential to Achieve Goals: Poor    Frequency Min 1X/week     Co-evaluation PT/OT/SLP Co-Evaluation/Treatment: Yes Reason for Co-Treatment: To address functional/ADL transfers;For patient/therapist safety PT goals addressed during session: Mobility/safety with mobility OT goals addressed during session: ADL's and self-care;Strengthening/ROM       AM-PAC PT "6 Clicks" Mobility  Outcome Measure Help needed turning from your back to your side while in a flat bed without using bedrails?: A Lot Help needed moving from lying on your back to sitting on the side of a flat bed without using bedrails?: Total Help needed moving to and from a bed to a chair (including a wheelchair)?: Total Help needed standing up from a chair using your arms (e.g., wheelchair or bedside chair)?: Total Help needed to walk in hospital room?: Total Help needed climbing 3-5 steps with a railing? : Total 6 Click Score: 7    End of Session   Activity Tolerance: Patient tolerated treatment well Patient left: in  bed;with bed alarm set;with call bell/phone within reach Nurse Communication: Mobility status;Need for lift equipment PT Visit Diagnosis: Muscle weakness (generalized) (M62.81);Difficulty in walking, not elsewhere classified (R26.2);Adult, failure to thrive (R62.7);Other abnormalities of gait and mobility (R26.89)    Time: 1120-1140 PT Time Calculation (min) (ACUTE ONLY): 20 min   Charges:   PT Evaluation $PT Eval Moderate Complexity: 1 Mod          Minal Stuller B, PT, DPT Acute Rehab Services [phone]   Gladys Damme 03/30/2023, 3:01 PM

## 2023-03-30 NOTE — Evaluation (Signed)
Occupational Therapy Evaluation Patient Details Name: Kara Mullins MRN: [id number] DOB: [birth year] Today's Date: 03/30/2023   History of Present Illness Pt is a 72 y/o F presenting to ED on 6/8 with worsening SOB and edema. Admitted for acute on chronic CHF. R IJ tunneled dialysis catheter placed 6/13. PMH includes HTN, CHF, CKD IV, anxiety.   Clinical Impression   Pt reports having assist at baseline for ADLs at bed level, able to perform seated grooming/self-feeding tasks. Reports she lives with daughter, and sits up EOB for ~30 mins at a time at home. Pt currently needing set up - total A for ADLs, max +2 for bed mobility, further mobility deferred due to fatigue. Pt presenting with impairments listed below, will follow acutely. Recommend HHOT at d/c.      Recommendations for follow up therapy are one component of a multi-disciplinary discharge planning process, led by the attending physician.  Recommendations may be updated based on patient status, additional functional criteria and insurance authorization.   Assistance Recommended at Discharge Frequent or constant Supervision/Assistance  Patient can return home with the following A lot of help with bathing/dressing/bathroom;A lot of help with walking and/or transfers;Assist for transportation    Functional Status Assessment  Patient has not had a recent decline in their functional status  Equipment Recommendations  None recommended by OT    Recommendations for Other Services PT consult     Precautions / Restrictions Precautions Precautions: Fall Precaution Comments:  Restrictions Weight Bearing Restrictions: No      Mobility Bed Mobility Overal bed mobility: Needs Assistance Bed Mobility: Supine to Sit     Supine to sit: Max assist, +2 for physical assistance          Transfers                   General transfer comment: deferred      Balance Overall balance assessment: Needs  assistance Sitting-balance support: Bilateral upper extremity supported Sitting balance-Leahy Scale: Fair                                     ADL either performed or assessed with clinical judgement   ADL Overall ADL's : Needs assistance/impaired Eating/Feeding: Set up;Independent;Sitting   Grooming: Wash/dry hands;Wash/dry face;Set up;Sitting   Upper Body Bathing: Moderate assistance;Sitting   Lower Body Bathing: Total assistance;Sitting/lateral leans   Upper Body Dressing : Moderate assistance;Sitting;Bed level   Lower Body Dressing: Total assistance;Sitting/lateral leans;Sit to/from stand   Toilet Transfer: Maximal assistance;+2 for physical assistance   Toileting- Clothing Manipulation and Hygiene: Total assistance       Functional mobility during ADLs: Maximal assistance;+2 for physical assistance       Vision   Vision Assessment?: No apparent visual deficits     Perception Perception Perception Tested?: No   Praxis Praxis Praxis tested?: Not tested    Pertinent Vitals/Pain Pain Assessment Pain Assessment: No/denies pain     Hand Dominance Right   Extremity/Trunk Assessment Upper Extremity Assessment Upper Extremity Assessment: Generalized weakness   Lower Extremity Assessment Lower Extremity Assessment: Defer to PT evaluation   Cervical / Trunk Assessment Cervical / Trunk Assessment: Other exceptions Cervical / Trunk Exceptions: large body habitus   Communication Communication Communication: No difficulties   Cognition Arousal/Alertness: Awake/alert Behavior During Therapy: Flat affect Overall Cognitive Status: Within Functional Limits for tasks assessed  General Comments  VSS on RA    Exercises     Shoulder Instructions      Home Living Family/patient expects to be discharged to:: Private residence Living Arrangements: Children Available Help at Discharge:  Family;Available 24 hours/day Type of Home: House Home Access: Ramped entrance     Home Layout: One level     Bathroom Shower/Tub: Sponge bathes at baseline   Bathroom Toilet: Handicapped height     Home Equipment: Rollator (4 wheels);Wheelchair - manual;BSC/3in1;Other (comment);Cane - single point;Hospital bed [facility] Lift)   Additional Comments: was sponge bathing, lives with daughter and son in law, daughter is with pt all day, grand daughter is able to assist in evenings, son in law works at night.      Prior Functioning/Environment Prior Level of Function : Needs assist             Mobility Comments: sits EOB ~30 min/day per pt ADLs Comments: daughter assisted with bed level toileting, bathing for LB and dressing, pt performs self feeding and seated grooming tasks        OT Problem List: Decreased strength;Decreased activity tolerance;Decreased knowledge of use of DME or AE;Obesity;Pain      OT Treatment/Interventions: Self-care/ADL training;Therapeutic exercise;Energy conservation;DME and/or AE instruction;Therapeutic activities;Patient/family education;Balance training    OT Goals(Current goals can be found in the care plan section) Acute Rehab OT Goals Patient Stated Goal: none stated OT Goal Formulation: With patient Time For Goal Achievement: 04/13/23 Potential to Achieve Goals: Good ADL Goals Pt Will Perform Upper Body Dressing: with min assist;sitting;bed level Pt Will Transfer to Toilet: with mod assist;squat pivot transfer;stand pivot transfer;bedside commode Pt/caregiver will Perform Home Exercise Program: Both right and left upper extremity;Increased ROM;Increased strength;With written HEP provided;With Supervision Additional ADL Goal #1: pt will tolerate sitting up in chair x1 hr in order to improve activity tolerance for IADLs/ADLs  OT Frequency: Min 1X/week    Co-evaluation PT/OT/SLP Co-Evaluation/Treatment: Yes Reason for Co-Treatment: To address  functional/ADL transfers;For patient/therapist safety   OT goals addressed during session: ADL's and self-care;Strengthening/ROM      AM-PAC OT "6 Clicks" Daily Activity     Outcome Measure Help from another person eating meals?: None Help from another person taking care of personal grooming?: A Little Help from another person toileting, which includes using toliet, bedpan, or urinal?: A Lot Help from another person bathing (including washing, rinsing, drying)?: A Lot Help from another person to put on and taking off regular upper body clothing?: A Lot Help from another person to put on and taking off regular lower body clothing?: Total 6 Click Score: 14   End of Session Nurse Communication: Mobility status  Activity Tolerance: Patient limited by fatigue Patient left: in bed;with call bell/phone within reach;with bed alarm set  OT Visit Diagnosis: Unsteadiness on feet (R26.81);Other abnormalities of gait and mobility (R26.89);Muscle weakness (generalized) (M62.81)                Time: 8657-8469 OT Time Calculation (min): 21 min Charges:  OT General Charges $OT Visit: 1 Visit OT Evaluation $OT Eval Moderate Complexity: 1 Mod  Fortune Brannigan K, OTD, OTR/L SecureChat Preferred Acute Rehab [phone]   Kara Mullins K Koonce 03/30/2023, 1:12 PM

## 2023-03-30 NOTE — Progress Notes (Signed)
Patient choked on a frito chip last night while eating with her daughter. She is coughing on and off in her sleep, but coughs a lot after drinking water this morning. Provider notified. Orders received.

## 2023-03-31 ENCOUNTER — Encounter: Payer: Medicare (Managed Care) | Admitting: Family

## 2023-03-31 DIAGNOSIS — Z992 Dependence on renal dialysis: Secondary | ICD-10-CM | POA: Insufficient documentation

## 2023-03-31 DIAGNOSIS — I5033 Acute on chronic diastolic (congestive) heart failure: Secondary | ICD-10-CM | POA: Diagnosis not present

## 2023-03-31 DIAGNOSIS — I132 Hypertensive heart and chronic kidney disease with heart failure and with stage 5 chronic kidney disease, or end stage renal disease: Secondary | ICD-10-CM | POA: Insufficient documentation

## 2023-03-31 MED ORDER — HEPARIN SODIUM (PORCINE) 5000 UNIT/ML IJ SOLN
5000.0000 [IU] | Freq: Three times a day (TID) | INTRAMUSCULAR | Status: DC
  Administered 2023-03-31 – 2023-04-01 (×3): 5000 [IU] via SUBCUTANEOUS
  Filled 2023-03-31 (×4): qty 1

## 2023-03-31 MED ORDER — CHLORHEXIDINE GLUCONATE CLOTH 2 % EX PADS: 6.0000 | MEDICATED_PAD | Freq: Every day | CUTANEOUS | Status: DC

## 2023-03-31 MED ORDER — HYDROMORPHONE HCL 1 MG/ML IJ SOLN
0.5000 mg | INTRAMUSCULAR | Status: DC | PRN
  Filled 2023-03-31: qty 0.5

## 2023-03-31 MED ORDER — HEPARIN SODIUM (PORCINE) 1000 UNIT/ML IJ SOLN
INTRAMUSCULAR | Status: AC
  Administered 2023-03-31: 1000 [IU]
  Filled 2023-03-31: qty 4

## 2023-03-31 MED ORDER — OXYCODONE HCL 5 MG PO TABS: 5.0000 mg | ORAL_TABLET | ORAL | Status: DC | PRN

## 2023-03-31 NOTE — Progress Notes (Addendum)
Pt has been accepted at [facility] on TTS 11:50 am chair time. Pt can start on Thursday and will need to arrive at 10:50 to complete paperwork prior to treatment. Pt will need to trial HD in chair prior to d/c and to attempt today per nephrologist. Spoke to pt's daughter, [name], via phone to discuss above arrangements. Daughter agreeable to plan. Arrangements text to daughter as well. Update provided to attending, nephrologist, and RN CM as well. Arrangements added to AVS. Will assist as needed.   Olivia Canter Renal Navigator [phone]  Addendum at 1:47 pm: Received a call from pt's daughter requesting a 1st shift appt per pt's request. [facility] has a TTS 6:40 am chair time available. Pt can still start on Thursday but will need to go to clinic on Wednesday at 1:00 to complete paperwork. Spoke to pt's daughter via phone to make her aware of new appt time and need for paperwork on Wed. Daughter agreeable to plan. Will add arrangements to AVS. Update provided to attending, nephrologist, and RN CM.

## 2023-03-31 NOTE — Progress Notes (Addendum)
Received patient in bed to unit.  Alert and oriented.  Informed consent signed and in chart.   TX duration: 3  Patient tolerated well.  Transported back to the room  Alert, without acute distress.  Hand-off given to patient's nurse.   Access used: Yes Access issues: No  Total UF removed: 2500 Medication(s) given: See MAR Post HD VS: See Above Grid Post HD weight: Unable to get weight, D/T pt. In a chair   [facility]  03/31/23 1905  Vitals  Temp 97.7 F (36.5 C)  Pulse Rate 72  Resp 14  BP (!) 178/72  SpO2 98 %  O2 Device Room Air  Oxygen Therapy  Patient Activity (if Appropriate) In bed  Pulse Oximetry Type Continuous  Post Treatment  Dialyzer Clearance Clear  Duration of HD Treatment -hour(s) 3 hour(s)  Hemodialysis Intake (mL) 0 mL  Liters Processed 54  Fluid Removed (mL) 2500 mL  Tolerated HD Treatment Yes  Post-Hemodialysis Comments Pt. tolerated procedure well

## 2023-03-31 NOTE — Procedures (Signed)
Seen and examined on dialysis.  Procedure supervised.  Blood pressure 166/73 and HR 68.  Tolerating goal.    Tolerating HD in a chair.  She is ok with getting HD again tomorrow  Estanislado Emms, MD 03/31/2023  4:59 PM

## 2023-03-31 NOTE — Progress Notes (Signed)
Patient ID: Kara Mullins, female   DOB: 1951-01-19, 72 y.o.   MRN: [id number] [facility] Progress Note   Assessment/ Plan:   1.  End-stage renal disease following volume overload in patient with chronic kidney disease stage V:  Started on hemodialysis last week after failing adequate volume management/diuresis with advancing chronic kidney disease; this was undertaken after extensive discussion because she had previously made the decision not to pursue any forms of renal replacement therapy.  She underwent creation of left BCF and placement of right IJ TDC by vascular surgery on 6/13 (much appreciated).   - HD today in a recliner to assess her physical ability to undertake outpatient dialysis.  Continue efforts at mobilization out of bed with assistance from PT. - strict ins/outs - reached out to HD SW to [facility] for outpatient HD unit - we are still awaiting approval for her   2.  Acute exacerbation of combined systolic/diastolic heart failure: optimizing volume with HD.  Continue lasix and can transition to lasix 80 mg PO daily on non-HD days after discharge   3.  Anemia of CKD: Secondary to chronic disease including chronic kidney disease - on aranesp 100 mcg every Wednesday.  S/p ferlecit  4.  Hypertension: optimizing volume with HD   5. Hyperphosphatemia - on renvela.  Start activated vit D outpatient   Disposition - needs outpatient HD unit finalized prior to discharge.  We are having her do HD in a recliner as above    Subjective:   Strict ins/outs are not available over 6/16 - she had 2 unmeasured urine voids.  Last HD on 6/15 with 2.5 kg UF.  She states that starting HD has been so much easier on her than she had thought.  She is breathing better.  Spoke with HD SW and she has not yet been accepted to an outpatient HD unit but they have started the process.   Review of systems:  She denies shortness of breath  Denies chest pain  Denies nausea/vomiting      Objective:   BP (!) 154/64 (BP Location: Right Arm)   Pulse 76   Temp 98.5 F (36.9 C) (Oral)   Resp 17   Ht 5\' 5"  (1.651 m)   Wt 86.8 kg   SpO2 97%   BMI 31.84 kg/m   Intake/Output Summary (Last 24 hours) at 03/31/2023 0846 Last data filed at 03/31/2023 4098 Gross per 24 hour  Intake 360 ml  Output --  Net 360 ml   Weight change: 0.4 kg  Physical Exam:  General elderly female in bed in no acute distress HEENT normocephalic atraumatic extraocular movements intact sclera anicteric Neck supple trachea midline Lungs clear to auscultation bilaterally normal work of breathing at rest on room air  Heart S1S2 no rub Abdomen soft nontender nondistended Extremities 2-3+ edema lower extremities and left upper extremity; no edema right upper extremity Psych normal mood and affect Access LUE AVF bruit noted; no thrill, swollen; RIJ tunneled dialysis catheter    Imaging: DG Chest 1 View  Result Date: 03/30/2023 CLINICAL DATA:  Follow-up, shortness of breath EXAM: CHEST  1 VIEW COMPARISON:  Chest x-ray 03/27/2023 FINDINGS: Small pleural effusions are again seen. Bibasilar infiltrates are unchanged. The heart is enlarged. There central pulmonary vascular congestion. No pneumothorax. Right-sided central venous catheter tip projects over the SVC. IMPRESSION: 1. Cardiomegaly with central pulmonary vascular congestion. 2. Small bilateral pleural effusions and bibasilar infiltrates, unchanged. Electronically Signed   By: Mcneil Sober.D.  On: 03/30/2023 16:56    Labs: BMET Recent Labs  Lab 03/25/23 0100 03/26/23 0012 03/27/23 0015 03/28/23 0054 03/29/23 0656 03/30/23 0052  NA 136 138 136 135 135 133*  K 4.1 4.1 4.2 4.5 4.0 3.6  CL 103 105 103 102 100 99  CO2 23 24 24 22 24 25   GLUCOSE 91 80 78 125* 87 86  BUN 77* 76* 76* 79* 67* 51*  CREATININE 4.03* 4.00* 3.86* 3.93* 3.57* 2.94*  CALCIUM 7.0* 7.0* 7.0* 7.5* 7.2* 7.0*  PHOS  --  6.8* 6.5* 6.8* 6.3* 4.7*   CBC Recent Labs   Lab 03/25/23 0100 03/28/23 0730 03/29/23 0656  WBC 3.4* 3.5* 4.9  HGB 8.8* 10.1* 8.9*  HCT 29.0* 32.5* 28.7*  MCV 90.9 93.7 91.4  PLT 134* 139* 118*    Medications:     carvedilol  6.25 mg Oral BID WC   Chlorhexidine Gluconate Cloth  6 each Topical Daily   darbepoetin (ARANESP) injection - NON-DIALYSIS  100 mcg Subcutaneous Q Wed-1800   folic acid  1 mg Oral Daily   furosemide  80 mg Oral Daily   heparin  5,000 Units Subcutaneous Q8H   hydrALAZINE  25 mg Oral Q8H   hydrocortisone cream   Topical BID   isosorbide mononitrate  30 mg Oral Daily   sevelamer carbonate  1,600 mg Oral TID WC   sodium chloride flush  3 mL Intravenous Q12H   Estanislado Emms, MD 03/31/2023, 9:18 AM

## 2023-03-31 NOTE — Care Management Important Message (Signed)
Important Message  Patient Details  Name: Kara Mullins MRN: [id number] Date of Birth: [birth year]   Medicare Important Message Given:  Yes     Sherilyn Banker 03/31/2023, 4:34 PM

## 2023-03-31 NOTE — Progress Notes (Signed)
   CC:  Chief Complaint  Patient presents with   Shortness of Breath     Brief narrative: Kara Mullins is a 72 y.o. female with a history of a hypertension, hyperlipidemia, diastolic heart failure, CKD stage V, general anxiety disorder.  Patient presented secondary to shortness of breath with evidence of acute heart failure.  Patient started on IV Lasix diuresis.  Hospitalization complicated by underlying CKD stage V with continued worsening of renal function requiring initiation of hemodialysis.  AV fistula creation by vascular surgery on 6/13.  Plan for outpatient hemodialysis on discharge.  Subjective:  Patient reports no issues overnight.  Overall, she is said that her swelling has improved in her left upper extremity and her bilateral legs.  Specific concerns at this time.  Objective:  BP (!) 154/64 (BP Location: Right Arm)   Pulse 76   Temp 98.5 F (36.9 C) (Oral)   Resp 17   Ht 5\' 5"  (1.651 m)   Wt 86.8 kg   SpO2 97%   BMI 31.84 kg/m   General exam: Appears calm and comfortable Respiratory system: Clear to auscultation. Respiratory effort normal. Cardiovascular system: S1 & S2 heard, RRR. 1/6 systolic murmur. Gastrointestinal system: Abdomen is nondistended, soft and nontender. No organomegaly or masses felt. Normal bowel sounds heard. Central nervous system: Alert and oriented. No focal neurological deficits. Musculoskeletal: BLE 2+ pitting edema and LUE 2+ pitting edema. No calf tenderness Skin: No cyanosis. No rashes Psychiatry: Judgement and insight appear normal. Mood & affect appropriate.   Assessment/Plan:  Acute on chronic diastolic heart failure Present on admission.  Patient initially placed on Lasix IV diuresis.  Management complicated by worsening renal failure requiring initiation of hemodialysis.  Volume management now per nephrology. -Nephrology recommendations: Hemodialysis Lasix 80 mg daily  ESRD Patient presented with CKD stage V and progressed to  ESRD requiring initiation of hemodialysis. -Nephrology recommendations for continued hemodialysis  Anemia of chronic disease Presumed some chronic kidney disease.  Patient managed on EPO per nephrology.  Metabolic bone disease In setting of chronic kidney disease. -Continue sevelamer  Paroxysmal atrial fibrillation Patient is rate controlled but not on coagulation. -Continue carvedilol  Hypertension -Continue carvedilol, hydralazine, isosorbide mononitrate  Pancytopenia Unclear etiology.  Anemia likely related to appears to be secondary to acute illness and has started to improve.  No recent CBC. -Check CBC in a.m.  Dysphagia Speech therapy consulted with recommendations for regular/thin liquid diet.  Pressure injury Sacrum and right buttock.  Present on admission.   Jacquelin Hawking, MD [facility] 03/31/2023, 9:49 AM

## 2023-04-01 ENCOUNTER — Other Ambulatory Visit (HOSPITAL_COMMUNITY): Payer: Self-pay

## 2023-04-01 ENCOUNTER — Encounter (HOSPITAL_COMMUNITY): Payer: Self-pay

## 2023-04-01 DIAGNOSIS — S31000A Unspecified open wound of lower back and pelvis without penetration into retroperitoneum, initial encounter: Secondary | ICD-10-CM | POA: Diagnosis not present

## 2023-04-01 DIAGNOSIS — D61818 Other pancytopenia: Secondary | ICD-10-CM

## 2023-04-01 DIAGNOSIS — E669 Obesity, unspecified: Secondary | ICD-10-CM | POA: Diagnosis not present

## 2023-04-01 DIAGNOSIS — I5033 Acute on chronic diastolic (congestive) heart failure: Secondary | ICD-10-CM | POA: Diagnosis not present

## 2023-04-01 LAB — CBC
HCT: 27.9 % — ABNORMAL LOW (ref 36.0–46.0)
Hemoglobin: 8.7 g/dL — ABNORMAL LOW (ref 12.0–15.0)
MCH: 28.4 pg (ref 26.0–34.0)
MCHC: 31.2 g/dL (ref 30.0–36.0)
MCV: 91.2 fL (ref 80.0–100.0)
Platelets: 109 10*3/uL — ABNORMAL LOW (ref 150–400)
RBC: 3.06 MIL/uL — ABNORMAL LOW (ref 3.87–5.11)
RDW: 15.9 % — ABNORMAL HIGH (ref 11.5–15.5)
WBC: 4.7 10*3/uL (ref 4.0–10.5)
nRBC: 0 % (ref 0.0–0.2)

## 2023-04-01 LAB — RENAL FUNCTION PANEL
Albumin: 1.6 g/dL — ABNORMAL LOW (ref 3.5–5.0)
Anion gap: 6 (ref 5–15)
BUN: 36 mg/dL — ABNORMAL HIGH (ref 8–23)
CO2: 28 mmol/L (ref 22–32)
Calcium: 7.1 mg/dL — ABNORMAL LOW (ref 8.9–10.3)
Chloride: 101 mmol/L (ref 98–111)
Creatinine, Ser: 2.41 mg/dL — ABNORMAL HIGH (ref 0.44–1.00)
GFR, Estimated: 21 mL/min — ABNORMAL LOW (ref >60)
Glucose, Bld: 87 mg/dL (ref 70–99)
Phosphorus: 3.4 mg/dL (ref 2.5–4.6)
Potassium: 3.3 mmol/L — ABNORMAL LOW (ref 3.5–5.1)
Sodium: 135 mmol/L (ref 135–145)

## 2023-04-01 MED ORDER — FUROSEMIDE 80 MG PO TABS
80.0000 mg | ORAL_TABLET | ORAL | 2 refills | Status: DC
  Filled 2023-04-01: qty 30, 70d supply, fill #0

## 2023-04-01 MED ORDER — HEPARIN SODIUM (PORCINE) 1000 UNIT/ML IJ SOLN
INTRAMUSCULAR | Status: AC
  Filled 2023-04-01: qty 4

## 2023-04-01 MED ORDER — HYDRALAZINE HCL 25 MG PO TABS
25.0000 mg | ORAL_TABLET | Freq: Three times a day (TID) | ORAL | 1 refills | Status: DC
  Filled 2023-04-01: qty 180, 60d supply, fill #0

## 2023-04-01 MED ORDER — HEPARIN SODIUM (PORCINE) 1000 UNIT/ML IJ SOLN
3200.0000 [IU] | Freq: Once | INTRAMUSCULAR | Status: AC
  Administered 2023-04-01: 3200 [IU]

## 2023-04-01 MED ORDER — ISOSORBIDE MONONITRATE ER 30 MG PO TB24
30.0000 mg | ORAL_TABLET | Freq: Every day | ORAL | 1 refills | Status: DC
  Filled 2023-04-01: qty 90, 90d supply, fill #0

## 2023-04-01 MED ORDER — FUROSEMIDE 80 MG PO TABS
ORAL_TABLET | ORAL | 2 refills | Status: DC
  Filled 2023-04-01: qty 16, 28d supply, fill #0

## 2023-04-01 MED ORDER — NEPRO/CARBSTEADY PO LIQD
237.0000 mL | ORAL | Status: DC
  Administered 2023-04-01: 237 mL via ORAL

## 2023-04-01 MED ORDER — CARVEDILOL 6.25 MG PO TABS
6.2500 mg | ORAL_TABLET | Freq: Two times a day (BID) | ORAL | 1 refills | Status: DC
  Filled 2023-04-01: qty 180, 90d supply, fill #0

## 2023-04-01 NOTE — Progress Notes (Signed)
Received patient in bed to unit.  Alert and oriented.  Informed consent signed and in chart.   TX duration:3.25  Patient tolerated well.  Transported back to the room  Alert, without acute distress.  Hand-off given to patient's nurse.   Access used: right [facility] Access issues: none  Total UF removed: 3L Medication(s) given: none   04/01/23 1218  Vitals  Temp 97.6 F (36.4 C)  Temp Source Oral  BP (!) 167/75  MAP (mmHg) 100  BP Location Right Arm  BP Method Automatic  Patient Position (if appropriate) Sitting  Pulse Rate 63  Pulse Rate Source Monitor  ECG Heart Rate 64  Resp 13  Oxygen Therapy  SpO2 100 %  O2 Device Room Air  During Treatment Monitoring  HD Safety Checks Performed Yes  Intra-Hemodialysis Comments Tx completed;Tolerated well  Dialysis Fluid Bolus Normal Saline  Bolus Amount (mL) 300 mL      Kara Mullins [facility]

## 2023-04-01 NOTE — Progress Notes (Signed)
Initial Nutrition Assessment  DOCUMENTATION CODES:   Not applicable  INTERVENTION:  Liberalize diet to 2 gram sodium Nepro Shake po once daily, each supplement provides 425 kcal and 19 grams protein "Food Pyramid for Healthy Eating with Kidney Disease" handout added to AVS  NUTRITION DIAGNOSIS:   Increased nutrient needs related to chronic illness, wound healing as evidenced by estimated needs.  GOAL:   Patient will meet greater than or equal to 90% of their needs  MONITOR:   PO intake, Labs, Weight trends, I & O's, Skin  REASON FOR ASSESSMENT:   Consult Diet education (new ESRD)  ASSESSMENT:   Pt admitted with SOB d/t acute on chronic CHF. Admission c/b worsening renal function requiring initiation of HD. PMH significant for HTN, HLD, diastolic HF, CKD stage V, GAD.  Patient off unit for HD. Unable to obtain detailed nutrition related history at this time. Renal nutrition handout added to AVS. Pt will also be followed by RD at the OP HD center. Given the reduced caloric nature of the renal diet and the need for increased nutrition d/t presence of wounds, pt would benefit from a liberalized diet to enhance nutritional adequacy.   Average meal completions: 77% x 8 recorded meals Pt assessed by ST for concern of aspiration and a known h/o dysphagia. ST recommend continuation of regular diet, thin liquids.   Dry weight unknown.  Admit weight: 89 kg Current weight: 80.6 kg  Edema: mild generalized and BLE, moderate LUE   Medications: folvite, lasix 80mg  daily, renvela  Labs: potassium 3.3, BUN 36, Cr 2.41, GFR 21  Post HD net UF (6/17) 2.5L  NUTRITION - FOCUSED PHYSICAL EXAM: RD working remotely. Deferred to follow up.   Diet Order:   Diet Order             Diet 2 gram sodium Room service appropriate? Yes; Fluid consistency: Thin; Fluid restriction: 1200 mL Fluid  Diet effective now           Diet - low sodium heart healthy                   EDUCATION  NEEDS:   Education needs have been addressed  Skin:  Skin Assessment: Skin Integrity Issues: Skin Integrity Issues:: Stage I, Incisions Stage I: sacrum Incisions: R neck, L arm closed incisions  Last BM:  6/16 type 5 large  Height:   Ht Readings from Last 1 Encounters:  03/27/23 5\' 5"  (1.651 m)    Weight:   Wt Readings from Last 1 Encounters:  04/01/23 80.6 kg    Ideal Body Weight:  56.8 kg  BMI:  Body mass index is 29.57 kg/m.  Estimated Nutritional Needs:   Kcal:  1600-1800  Protein:  80-95g  Fluid:  1L + UOP  Drusilla Kanner, RDN, LDN Clinical Nutrition

## 2023-04-01 NOTE — Discharge Summary (Signed)
Physician Discharge Summary  Kara Mullins WGN:[id number] DOB: 06/01/1951 DOA: 03/22/2023  PCP: Caesar Bookman, NP  Admit date: 03/22/2023 Discharge date: 04/01/2023 Admitted From: Home Disposition: Home Recommendations for Outpatient Follow-up:  Follow up with PCP, nephrology, vascular surgery Outpatient follow-up with vascular surgery as below Check BP, CBC and CMP Please follow up on the following pending results: None  Home Health: PT/OT/RN Equipment/Devices: Gel mattress.  Discharge Condition: Stable CODE STATUS: DNR/DNI  Follow-up Information     [name], [name], NP Follow up.   Specialty: Family Medicine Why: GO: Monday June 17 at 9:40am Contact information: [address] [place] [address] [phone]         [name], MD Follow up in 5 day(s).   Specialties: Vascular Surgery, Interventional Cardiology Contact information: [address] [place] [address] [phone]         Ronney Asters, NP Follow up.   Specialty: Cardiology Why: Your appointment is on 04/21/2023 with Edd Fabian.  Please arrive 15 minutes prior to appointment time which is at 1005. Contact information: [address] [place] [address] [phone]         Ginette Otto, [facility]. Go on 04/02/2023.   Why: On Wednesday, patinet will need to arrive at 1:00 pm to complete paperwork at clinic in order to be able to start on Thursday.  Schedule is Tuesday, Thursday, Saturday with 6:40 am chair time. Contact information: [address] [place] [address] [phone]         [facility], [facility] Follow up.   Why: gel mattress Contact information: [address] [place] [address] [phone]                 Hospital course 72 year old F with PMH of CKD-5, diastolic CHF, HTN, HLD and anxiety presenting with shortness of breath and admitted for acute heart failure.  BNP elevated to 715.  She was  started on IV Lasix.  CXR consistent with CHF.  However, renal function remained elevated.  Nephrology consulted.  She had AV fistula placed by vascular surgery on 6/13.  She was assigned outpatient hemodialysis TTS and cleared for discharge outpatient.  Patient will start outpatient dialysis on 6/20.  Outpatient follow-up with PCP, nephrology and vascular surgery as above.  See individual problem list below for more.   Problems addressed during this hospitalization Principal Problem:   Acute on chronic diastolic (congestive) heart failure (HCC) Active Problems:   CKD (chronic kidney disease) stage 5, GFR less than 15 ml/min (HCC)   Essential hypertension   Hyperlipidemia   Pancytopenia (HCC)   Obesity (BMI 30-39.9)   Sacral wound   Paroxysmal atrial fibrillation (HCC)   Acute on chronic diastolic heart failure: Initially diuresed with IV Lasix but developed progressive renal failure to ESRD requiring hemodialysis. -HD TTS -Nephrology recommended p.o. Lasix 80 mg daily on MWFSu -Outpatient follow-up with nephrology as above. -HH PT/OT/RN ordered.  ESRD -HD as above. -Fistula placed by vascular surgery -Outpatient follow-up as above.   Anemia of chronic disease/pancytopenia -Per nephrology.   Metabolic bone disease -Per nephrology.   Paroxysmal A-fib: Rate controlled.  Not on AC. -Continue carvedilol   Hypertension -Continue carvedilol, hydralazine, isosorbide mononitrate and Lasix as above   Physical deconditioning -HH PT/OT ordered.   Dysphagia: Resolved. Speech therapy consulted with recommendations for regular/thin liquid diet.   Increased nutrient needs Nutrition Problem: Increased nutrient needs Etiology: chronic illness, wound healing Signs/Symptoms: estimated needs  Interventions: Liberalize Diet, Education, Nepro shake  Pressure skin injury: POA Pressure Injury 03/28/22 Sacrum Mid Stage 1 -  Intact skin with non-blanchable redness of a localized area usually  over a bony prominence. Area is darker and non-blanchable. Intact, clean, dry. (Active)  03/28/22 0126  Location: Sacrum  Location Orientation: Mid  Staging: Stage 1 -  Intact skin with non-blanchable redness of a localized area usually over a bony prominence.  Wound Description (Comments): Area is darker and non-blanchable. Intact, clean, dry.  Present on Admission: Yes  Dressing Type Foam - Lift dressing to assess site every shift 03/31/23 2000    Time spent 45 minutes  Vital signs Vitals:   04/01/23 1204 04/01/23 1218 04/01/23 1224 04/01/23 1227  BP: (!) 171/80 (!) 167/75 (!) 178/74   Pulse: (!) 59 63 64   Temp:  97.6 F (36.4 C)    Resp: 14 13 14    Height:      Weight:    Comment: in chair unable to obtain  SpO2: 100% 100% 100%   TempSrc:  Oral    BMI (Calculated):         Discharge exam  GENERAL: No apparent distress.  Nontoxic. HEENT: MMM.  Vision and hearing grossly intact.  NECK: Supple.  No apparent JVD.  RESP:  No IWOB.  Fair aeration bilaterally. CVS:  RRR. Heart sounds normal.  ABD/GI/GU: BS+. Abd soft, NTND.  MSK/EXT:  Moves extremities. No apparent deformity. No edema.  SKIN: no apparent skin lesion or wound NEURO: Awake and alert. Oriented appropriately.  No apparent focal neuro deficit. PSYCH: Calm. Normal affect.   Discharge Instructions Discharge Instructions     Diet - low sodium heart healthy   Complete by: As directed    Discharge instructions   Complete by: As directed    It has been a pleasure taking care of you!  You were hospitalized due to heart failure and kidney failure for which you have been started on dialysis.  We have also made some adjustment to your home medications.  Please review your new medication list and the directions on your medications before you take them.  Continue your dialysis outpatient.  Follow-up with your primary care doctor in 1 to 2 weeks or sooner if needed.   Take care,   Discharge wound care:   Complete by:  As directed    Wound care  Daily      Comments: Clean Stage 2 R buttocks PI and sacrum Stage 1 PI with soap and water, dry and apply silicone foam.  Lift silicone foam daily to assess.  Change silicone foam dressing q3 days and prn soiling.   Increase activity slowly   Complete by: As directed       Allergies as of 04/01/2023       Reactions   Crestor [rosuvastatin] Other (See Comments)   "Felt like she couldn't swallow"   Nitrofuran Derivatives Other (See Comments)   Unknown reaction        Medication List     STOP taking these medications    Multivitamin Adult Chew   RETACRIT IJ   torsemide 10 MG tablet Commonly known as: DEMADEX       TAKE these medications    carvedilol 6.25 MG tablet Commonly known as: COREG Take 1 tablet (6.25 mg total) by mouth 2 (two) times daily with a meal. What changed:  medication strength how much to take   CO Q 10 PO Take 1 capsule by mouth  daily.   feeding supplement Liqd Take 237 mLs by mouth 2 (two) times daily between meals.   ferrous sulfate 325 (65 FE) MG EC tablet Take 1 tablet (325 mg total) by mouth 2 (two) times daily.   folic acid 1 MG tablet Commonly known as: FOLVITE Take 1 tablet (1 mg total) by mouth daily.   furosemide 80 MG tablet Commonly known as: LASIX Take 1 tablet (80 mg total) by mouth every Monday, Wednesday, Friday and Sunday. Start taking on: April 02, 2023   hydrALAZINE 25 MG tablet Commonly known as: APRESOLINE Take 1 tablet (25 mg total) by mouth every 8 (eight) hours. What changed:  medication strength See the new instructions.   isosorbide mononitrate 30 MG 24 hr tablet Commonly known as: IMDUR Take 1 tablet (30 mg total) by mouth daily.   multivitamin Tabs tablet Take 1 tablet by mouth daily.   Muscle Rub 10-15 % Crea Apply 1 Application topically daily as needed for muscle pain.   sevelamer carbonate 800 MG tablet Commonly known as: RENVELA Take 800 mg by mouth daily with  supper.   sodium bicarbonate 650 MG tablet Take 650 mg by mouth 2 (two) times daily.               Durable Medical Equipment  (From admission, onward)           Start     Ordered   03/24/23 1449  For home use only DME Other see comment  Once       Comments: Gel Mattress  Question:  Length of Need  Answer:  Lifetime   03/24/23 1449              Discharge Care Instructions  (From admission, onward)           Start     Ordered   04/01/23 0000  Discharge wound care:       Comments: Wound care  Daily      Comments: Clean Stage 2 R buttocks PI and sacrum Stage 1 PI with soap and water, dry and apply silicone foam.  Lift silicone foam daily to assess.  Change silicone foam dressing q3 days and prn soiling.   04/01/23 0827            Consultations: Nephrology Vascular surgery  Procedures/Studies: Hemodialysis AV fistula   DG Chest 1 View  Result Date: 03/30/2023 CLINICAL DATA:  Follow-up, shortness of breath EXAM: CHEST  1 VIEW COMPARISON:  Chest x-ray 03/27/2023 FINDINGS: Small pleural effusions are again seen. Bibasilar infiltrates are unchanged. The heart is enlarged. There central pulmonary vascular congestion. No pneumothorax. Right-sided central venous catheter tip projects over the SVC. IMPRESSION: 1. Cardiomegaly with central pulmonary vascular congestion. 2. Small bilateral pleural effusions and bibasilar infiltrates, unchanged. Electronically Signed   By: Darliss Cheney M.D.   On: 03/30/2023 16:56   DG CHEST PORT 1 VIEW  Result Date: 03/27/2023 CLINICAL DATA:  Dialysis catheter insertion EXAM: PORTABLE CHEST 1 VIEW COMPARISON:  Chest radiograph 03/22/2023 FINDINGS: There is new right-sided dialysis catheter in place terminating in the region of the cavoatrial junction. The heart is enlarged, unchanged. The upper mediastinal contours are stable, with calcified plaque in the thoracic aorta. Moderate-sized bilateral pleural effusions with bibasilar  airspace opacity are likely not significantly changed compared to the prior study, allowing for slight differences in positioning and redistribution. There is likely mild pulmonary interstitial edema. There is no pneumothorax There is no acute osseous abnormality. IMPRESSION: 1.  New right-sided dialysis catheter in place terminating in the region of the cavoatrial junction. 2. Bilateral pleural effusions with adjacent airspace opacity, and mild pulmonary interstitial edema, likely not significantly changed since the study from 03/22/2023. Electronically Signed   By: Lesia Hausen M.D.   On: 03/27/2023 16:34   DG C-Arm 1-60 Min-No Report  Result Date: 03/27/2023 Fluoroscopy was utilized by the requesting physician.  No radiographic interpretation.   VAS [place] UPPER EXT VEIN MAPPING (PRE-OP AVF)  Result Date: 03/26/2023 UPPER EXTREMITY VEIN MAPPING Patient Name:  Kara Mullins  Date of Exam:   03/26/2023 Medical Rec #: [id number]       Accession #:    [id number] Date of Birth: 10-18-1950      Patient Gender: F Patient Age:   73 years Exam Location:  [facility] Procedure:      VAS [place] UPPER EXT VEIN MAPPING (PRE-OP AVF) Referring Phys: JAY PATEL --------------------------------------------------------------------------------  Indications: Pre-access. History: Significant of hypertension, hyperlipidemia, diastolic congestive heart          failure, CKD stage V, and chronic left arm swelling.  Performing Technologist: Marilynne Halsted RDMS, RVT  Examination Guidelines: A complete evaluation includes B-mode imaging, spectral Doppler, color Doppler, and power Doppler as needed of all accessible portions of each vessel. Bilateral testing is considered an integral part of a complete examination. Limited examinations for reoccurring indications may be performed as noted. +-----------------+-------------+----------+----------------+ Right Cephalic   Diameter (cm)Depth (cm)    Findings      +-----------------+-------------+----------+----------------+ Shoulder             0.37                                +-----------------+-------------+----------+----------------+ Prox upper arm       0.37        0.27                    +-----------------+-------------+----------+----------------+ Mid upper arm        0.22        0.14                    +-----------------+-------------+----------+----------------+ Dist upper arm       0.35        0.20      branching     +-----------------+-------------+----------+----------------+ Antecubital fossa    0.39        0.18   partial thrombus +-----------------+-------------+----------+----------------+ Prox forearm         0.33        0.22      branching     +-----------------+-------------+----------+----------------+ Mid forearm          0.31        0.12                    +-----------------+-------------+----------+----------------+ Dist forearm         0.28        0.18                    +-----------------+-------------+----------+----------------+ Wrist                0.29        0.14                    +-----------------+-------------+----------+----------------+ +-----------------+-------------+----------+---------+ Right Basilic    Diameter (cm)Depth (cm)Findings  +-----------------+-------------+----------+---------+ Shoulder  0.68                         +-----------------+-------------+----------+---------+ Prox upper arm       0.56               branching +-----------------+-------------+----------+---------+ Mid upper arm        0.41                         +-----------------+-------------+----------+---------+ Dist upper arm       0.48                         +-----------------+-------------+----------+---------+ Antecubital fossa    0.49                         +-----------------+-------------+----------+---------+ Prox forearm         0.38               branching  +-----------------+-------------+----------+---------+ Mid forearm          0.37                         +-----------------+-------------+----------+---------+ Distal forearm       0.30                         +-----------------+-------------+----------+---------+ +-----------------+-------------+----------+-----------------------+ Left Cephalic    Diameter (cm)Depth (cm)       Findings         +-----------------+-------------+----------+-----------------------+ Shoulder             0.39        0.28                           +-----------------+-------------+----------+-----------------------+ Prox upper arm       0.32        0.23                           +-----------------+-------------+----------+-----------------------+ Mid upper arm        0.37        0.29                           +-----------------+-------------+----------+-----------------------+ Dist upper arm       0.45        0.45                           +-----------------+-------------+----------+-----------------------+ Antecubital fossa    0.34        0.70   branching and 0.52/0.49 +-----------------+-------------+----------+-----------------------+ Prox forearm         0.39        1.00                           +-----------------+-------------+----------+-----------------------+ Mid forearm          0.35        1.00                           +-----------------+-------------+----------+-----------------------+ Dist forearm         0.29        0.37                           +-----------------+-------------+----------+-----------------------+  Wrist                0.24        0.30                           +-----------------+-------------+----------+-----------------------+ +-----------------+-------------+----------+--------+ Left Basilic     Diameter (cm)Depth (cm)Findings +-----------------+-------------+----------+--------+ Shoulder             0.58                         +-----------------+-------------+----------+--------+ Prox upper arm       0.58                        +-----------------+-------------+----------+--------+ Mid upper arm        0.45                        +-----------------+-------------+----------+--------+ Dist upper arm       0.43                        +-----------------+-------------+----------+--------+ Antecubital fossa    0.49                        +-----------------+-------------+----------+--------+ Prox forearm         0.29                        +-----------------+-------------+----------+--------+ Mid forearm          0.28                        +-----------------+-------------+----------+--------+ Distal forearm       0.25                        +-----------------+-------------+----------+--------+ Moderate edema throughout left arm. *See table(s) above for measurements and observations.  Diagnosing physician: Heath Lark Electronically signed by Heath Lark on 03/26/2023 at 4:57:12 PM.    Final    ECHOCARDIOGRAM LIMITED  Result Date: 03/23/2023    ECHOCARDIOGRAM LIMITED REPORT   Patient Name:   Kara Mullins Date of Exam: 03/23/2023 Medical Rec #:  [id number]      Height:       65.0 in Accession #:    [id number]     Weight:       196.6 lb Date of Birth:  03/16/1951     BSA:          1.964 m Patient Age:    71 years       BP:           139/63 mmHg Patient Gender: F              HR:           66 bpm. Exam Location:  Inpatient Procedure: Strain Analysis, Limited Color Doppler and Limited Echo Indications:     CHF-Acute Systolic I50.21  History:         Patient has prior history of Echocardiogram examinations, most                  recent 02/26/2023. CHF, Arrythmias:Atrial Fibrillation,                  Signs/Symptoms:Hypotension; Risk Factors:Hypertension and  Dyslipidemia. CKD.  Sonographer:     [name] Referring Phys:  [id number] Ronnald Ramp O'NEAL Diagnosing Phys: Dina Rich MD  IMPRESSIONS  1. Left ventricular ejection fraction, by estimation, is 45 to 50%. The left ventricle has mildly decreased function. The left ventricle demonstrates global hypokinesis.  2. Right ventricular systolic function is normal. The right ventricular size is normal. There is moderately elevated pulmonary artery systolic pressure.  3. Large pleural effusion in the left lateral region.  4. Mild mitral valve regurgitation.  5. The tricuspid valve is abnormal. Tricuspid valve regurgitation is mild.  6. The aortic valve is bicuspid. There is mild calcification of the aortic valve. There is mild thickening of the aortic valve. Aortic valve regurgitation is mild.  7. The inferior vena cava is normal in size with <50% respiratory variability, suggesting right atrial pressure of 8 mmHg.  8. Limited echo to evaluate LV function FINDINGS  Left Ventricle: Left ventricular ejection fraction, by estimation, is 45 to 50%. The left ventricle has mildly decreased function. The left ventricle demonstrates global hypokinesis. The left ventricular internal cavity size was normal in size. There is  no left ventricular hypertrophy. Right Ventricle: The right ventricular size is normal. Right vetricular wall thickness was not well visualized. Right ventricular systolic function is normal. There is moderately elevated pulmonary artery systolic pressure. The tricuspid regurgitant velocity is 3.47 m/s, and with an assumed right atrial pressure of 8 mmHg, the estimated right ventricular systolic pressure is 56.2 mmHg. Mitral Valve: There is mild thickening of the mitral valve leaflet(s). There is mild calcification of the mitral valve leaflet(s). Mild mitral annular calcification. Mild mitral valve regurgitation. Tricuspid Valve: The tricuspid valve is abnormal. Tricuspid valve regurgitation is mild . No evidence of tricuspid stenosis. Aortic Valve: The aortic valve is bicuspid. There is mild calcification of the aortic valve. There is  mild thickening of the aortic valve. Aortic valve regurgitation is mild. Aorta: The aortic root and ascending aorta are structurally normal, with no evidence of dilitation. Venous: The inferior vena cava is normal in size with less than 50% respiratory variability, suggesting right atrial pressure of 8 mmHg. Additional Comments: There is a large pleural effusion in the left lateral region.  LEFT VENTRICLE PLAX 2D LVIDd:         5.50 cm LVIDs:         4.00 cm LV PW:         0.90 cm LV IVS:        0.90 cm LVOT diam:     1.90 cm LVOT Area:     2.84 cm  IVC IVC diam: 1.60 cm LEFT ATRIUM         Index LA diam:    4.30 cm 2.19 cm/m   AORTA Ao Root diam: 3.00 cm Ao Asc diam:  3.50 cm TRICUSPID VALVE TR Peak grad:   48.2 mmHg TR Vmax:        347.00 cm/s  SHUNTS Systemic Diam: 1.90 cm Dina Rich MD Electronically signed by Dina Rich MD Signature Date/Time: 03/23/2023/1:55:08 PM    Final (Updated)    CT CHEST WO CONTRAST  Result Date: 03/23/2023 CLINICAL DATA:  Dyspnea of unclear etiology. EXAM: CT CHEST WITHOUT CONTRAST TECHNIQUE: Multidetector CT imaging of the chest was performed following the standard protocol without IV contrast. RADIATION DOSE REDUCTION: This exam was performed according to the departmental dose-optimization program which includes automated exposure control, adjustment of the mA and/or kV according to patient size and/or use  of iterative reconstruction technique. COMPARISON:  03/05/2021 FINDINGS: Cardiovascular: The heart size is enlarged. Aortic atherosclerosis. Coronary artery calcifications. No para scratch set no pericardial effusion. Mediastinum/Nodes: Thyroid gland, trachea appear normal. Patulous thoracic esophagus identified which may reflect chronic dysmotility. No enlarged mediastinal or axillary adenopathy. Lungs/Pleura: Moderate bilateral pleural effusions are identified, right greater than left. There is associated partial atelectasis involving bilateral lower lung zones with  volume loss. Atelectasis versus consolidation is noted within the inferolateral left upper lobe and lingula, image 77/4. No pneumothorax identified. No nodule or mass within the aerated portions of the lungs. Upper Abdomen: No acute abnormality. Musculoskeletal: No chest wall mass or suspicious bone lesions identified. There is marked diffuse edema identified throughout the subcutaneous soft tissues of the chest wall. IMPRESSION: 1. Moderate bilateral pleural effusions, right greater than left. There is associated partial atelectasis involving bilateral lower lung zones with volume loss. 2. Atelectasis versus consolidation is noted within the inferolateral left upper lobe and lingula. Correlate for any signs/symptoms of pneumonia. 3. Marked diffuse edema identified throughout the subcutaneous soft tissues of the chest wall. 4. Cardiomegaly and coronary artery calcifications. 5. Patulous thoracic esophagus which may reflect chronic dysmotility. 6.  Aortic Atherosclerosis (ICD10-I70.0). Electronically Signed   By: Signa Kell M.D.   On: 03/23/2023 11:24   DG Chest Portable 1 View  Result Date: 03/22/2023 CLINICAL DATA:  Shortness of breath. EXAM: PORTABLE CHEST 1 VIEW COMPARISON:  Feb 25, 2023 FINDINGS: The cardiac silhouette is mildly enlarged and unchanged in size. There is moderate severity calcification of the aortic arch. Moderate to marked severity airspace disease is seen within the mid to lower lung fields, bilaterally. Bilateral small to moderate sized pleural effusions are noted. No pneumothorax is identified. The visualized skeletal structures are unremarkable. IMPRESSION: 1. Stable moderate to marked severity bilateral airspace disease with stable small to moderate sized bilateral pleural effusions. Electronically Signed   By: Aram Candela M.D.   On: 03/22/2023 01:39       The results of significant diagnostics from this hospitalization (including imaging, microbiology, ancillary and  laboratory) are listed below for reference.     Microbiology: No results found for this or any previous visit (from the past 240 hour(s)).   Labs:  CBC: Recent Labs  Lab 03/28/23 0730 03/29/23 0656 04/01/23 0055  WBC 3.5* 4.9 4.7  HGB 10.1* 8.9* 8.7*  HCT 32.5* 28.7* 27.9*  MCV 93.7 91.4 91.2  PLT 139* 118* 109*   BMP &GFR Recent Labs  Lab 03/27/23 0015 03/28/23 0054 03/29/23 0656 03/30/23 0052 04/01/23 0055  NA 136 135 135 133* 135  K 4.2 4.5 4.0 3.6 3.3*  CL 103 102 100 99 101  CO2 24 22 24 25 28   GLUCOSE 78 125* 87 86 87  BUN 76* 79* 67* 51* 36*  CREATININE 3.86* 3.93* 3.57* 2.94* 2.41*  CALCIUM 7.0* 7.5* 7.2* 7.0* 7.1*  PHOS 6.5* 6.8* 6.3* 4.7* 3.4   Estimated Creatinine Clearance: 22.8 mL/min (A) (by C-G formula based on SCr of 2.41 mg/dL (H)). Liver & Pancreas: Recent Labs  Lab 03/27/23 0015 03/28/23 0054 03/29/23 0656 03/30/23 0052 04/01/23 0055  ALBUMIN 1.9* 1.9* 2.0* 1.9* 1.6*   No results for input(s): "LIPASE", "AMYLASE" in the last 168 hours. No results for input(s): "AMMONIA" in the last 168 hours. Diabetic: No results for input(s): "HGBA1C" in the last 72 hours. No results for input(s): "GLUCAP" in the last 168 hours. Cardiac Enzymes: No results for input(s): "CKTOTAL", "CKMB", "CKMBINDEX", "TROPONINI" in  the last 168 hours. No results for input(s): "PROBNP" in the last 8760 hours. Coagulation Profile: No results for input(s): "INR", "PROTIME" in the last 168 hours. Thyroid Function Tests: No results for input(s): "TSH", "T4TOTAL", "FREET4", "T3FREE", "THYROIDAB" in the last 72 hours. Lipid Profile: No results for input(s): "CHOL", "HDL", "LDLCALC", "TRIG", "CHOLHDL", "LDLDIRECT" in the last 72 hours. Anemia Panel: No results for input(s): "VITAMINB12", "FOLATE", "FERRITIN", "TIBC", "IRON", "RETICCTPCT" in the last 72 hours. Urine analysis:    Component Value Date/Time   COLORURINE YELLOW (A) 10/04/2022 1151   APPEARANCEUR TURBID (A)  10/04/2022 1151   LABSPEC 1.012 10/04/2022 1151   PHURINE 8.0 10/04/2022 1151   GLUCOSEU NEGATIVE 10/04/2022 1151   HGBUR NEGATIVE 10/04/2022 1151   BILIRUBINUR Negative 12/05/2022 1454   KETONESUR NEGATIVE 10/04/2022 1151   PROTEINUR Positive (A) 12/05/2022 1454   PROTEINUR >=300 (A) 10/04/2022 1151   UROBILINOGEN 0.2 12/05/2022 1454   UROBILINOGEN 0.2 11/19/2019 2004   NITRITE Negative 12/05/2022 1454   NITRITE NEGATIVE 10/04/2022 1151   LEUKOCYTESUR Moderate (2+) (A) 12/05/2022 1454   LEUKOCYTESUR LARGE (A) 10/04/2022 1151   Sepsis Labs: Invalid input(s): "PROCALCITONIN", "LACTICIDVEN"   SIGNED:  Almon Hercules, MD  [facility] 04/01/2023, 5:47 PM

## 2023-04-01 NOTE — TOC Transition Note (Signed)
Transition of Care [facility]) - CM/SW Discharge Note   Patient Details  Name: Kara Mullins MRN: [id number] Date of Birth: 1951-05-28  Transition of Care [facility]) CM/SW Contact:  [name], RN Phone Number: 04/01/2023, 3:00 PM   Clinical Narrative:    Patient is for dc today, NCM spoke with [name] regarding the gel mattres, he states they were waiting for a dc date so they could deliver to patient.  [employer] will be giving [name] a call to set up delivery of the gel mattress.  [name] will be transporting patient home today.  NCM notified Ashely with [facility] that she is for dc today.      Barriers to Discharge: Continued Medical Work up   Patient Goals and CMS Choice CMS Medicare.gov Compare Post Acute Care list provided to:: Patient Represenative (must comment) Choice offered to / list presented to : Adult Children  Discharge Placement                         Discharge Plan and Services Additional resources added to the After Visit Summary for   In-house Referral: NA Discharge Planning Services: CM Consult Post Acute Care Choice: Home Health, Resumption of Svcs/PTA Provider          DME Arranged: N/A DME Agency: NA       HH Arranged: RN, PT, OT HH Agency: [facility] Date HH Agency Contacted: 03/24/23 Time HH Agency Contacted: 1435 Representative spoke with at [facility] Agency: [name]  Social Determinants of Health (SDOH) Interventions SDOH Screenings   Food Insecurity: No Food Insecurity (03/22/2023)  Housing: Low Risk  (03/22/2023)  Transportation Needs: No Transportation Needs (03/22/2023)  Utilities: Not At Risk (03/22/2023)  Alcohol Screen: Low Risk  (06/11/2022)  Depression (PHQ2-9): Low Risk  (01/27/2023)  Financial Resource Strain: Low Risk  (06/11/2022)  Tobacco Use: Low Risk  (03/28/2023)     Readmission Risk Interventions    03/24/2023    2:26 PM 02/26/2023    2:53 PM 06/17/2022    3:36 PM  Readmission Risk Prevention Plan   Transportation Screening Complete Complete   PCP or Specialist Appt within 3-5 Days Complete    HRI or Home Care Consult Complete Complete   Social Work Consult for Recovery Care Planning/Counseling  Complete   Palliative Care Screening Not Applicable Not Applicable   Medication Review Oceanographer) Complete Referral to Pharmacy Complete  PCP or Specialist appointment within 3-5 days of discharge   Complete  HRI or Home Care Consult   Complete  SW Recovery Care/Counseling Consult   Complete  Palliative Care Screening   Not Applicable  Skilled Nursing Facility   Not Applicable

## 2023-04-01 NOTE — Plan of Care (Signed)
  Problem: Education: Goal: Knowledge of disease and its progression will improve Outcome: Adequate for Discharge   Problem: Fluid Volume: Goal: Compliance with measures to maintain balanced fluid volume will improve Outcome: Adequate for Discharge   Problem: Health Behavior/Discharge Planning: Goal: Ability to manage health-related needs will improve Outcome: Adequate for Discharge   Problem: Nutritional: Goal: Ability to make healthy dietary choices will improve Outcome: Adequate for Discharge   Problem: Clinical Measurements: Goal: Complications related to the disease process, condition or treatment will be avoided or minimized Outcome: Adequate for Discharge   

## 2023-04-01 NOTE — Plan of Care (Signed)
[facility]  Initial Hemodialysis Orders  Dialysis center: [facility]   Patient's name: Kara Mullins DOB: 1950-11-24 AKI or ESRD   Discharge diagnosis: Volume overload/Acute on chronic combined CHF 2.   Progressive CKD -->ESRD  Allergies:  Allergies  Allergen Reactions   Crestor [Rosuvastatin] Other (See Comments)    "Felt like she couldn't swallow"   Nitrofuran Derivatives Other (See Comments)    Unknown reaction    Date of First Dialysis: 03/28/23 Cause of renal disease:   Dialysis Prescription: Dialysis Frequency: TIW Tx duration: 3:45  BFR: 400 DFR: 600  EDW: ~ 81.5kg. Tolerated 3L UF on 6/18 Dialyzer: 180NRe UF profile/Sodium modeling?: -- Dialysis Bath: 3 K 2.5 Ca  Dialysis access: Access type: 1) R  IJ TDC    2) L BC AVF Date placed: 03/27/23 Surgeon: Lenell Antu Needle gauge: --  In Center Medications: Heparin Dose: ---  Type: ---- VDRA: Calcitriol/Hectorol: per clinic protocol  Venofer: per clinic protocol  Mircera: 150 mcg  Next dose due: 6/20 Sensipar: ---  Discharge labs: Hgb: 8.7 K+: 3.3      Ca: 7.1 Phos: 3.4 Alb: 1.6  Please draw routine labs. Additional labs needed: Check monthly labs on admission  Additional notes/follow-up:     Tomasa Blase [name] [facility] 04/01/2023,2:29 PM

## 2023-04-01 NOTE — Progress Notes (Signed)
New Dialysis Start    Patient identified as new dialysis start. Kidney Education packet assembled and given. Discussed the following items with patient:     Current medications and possible changes once started:  Discussed that patient's medications may change over time.  Ex; hypertension medications and diabetes medication.  Nephrologists will adjust as needed.   Fluid restrictions reviewed:  32 oz daily goal:  All liquids count; soups, ice, jello, fruits. Will also refer dietitian.   Phosphorus and potassium: Handout given showing high potassium and phosphorus foods.  Alternative food and drink options given. Will also refer dietitian.   Family support:  Daughter is her support, very engaged.   Outpatient Clinic Resources:  Discussed roles of Outpatient clinic staff and advised to make a list of needs, if any, to talk with outpatient staff if needed.   Care plan schedule: Informed patient of Care Plans in outpatient setting and to participate in the care plan.  An invitation would be given from outpatient clinic.    Dialysis Access Options:  Reviewed access options with patients. Discussed in detail about care at home with new AVG & AVF. Reviewed checking bruit and thrill. If dialysis catheter present, educated that patient could not take showers.  Catheter dressing changes were to be done by outpatient clinic staff only   Home therapy options:  Educated patient about home therapy options:  PD vs home hemo.     Patient verbalized understanding. Will continue to round on patient during admission.    Jean Rosenthal Dialysis Nurse Coordinator [phone]

## 2023-04-01 NOTE — Progress Notes (Signed)
Patient ID: Kara Mullins, female   DOB: [birth year], 72 y.o.   MRN: [id number] [facility] Progress Note   Assessment/ Plan:   1.  End-stage renal disease following volume overload in patient with chronic kidney disease stage V:  Started on hemodialysis last week after failing adequate volume management/diuresis with advancing chronic kidney disease; this was undertaken after extensive discussion because she had previously made the decision not to pursue any forms of renal replacement therapy.  She underwent creation of left BCF and placement of right IJ TDC by vascular surgery on 6/13 (much appreciated).   - HD today in a recliner to get on TTS schedule.  Continue efforts at mobilization out of bed with assistance from PT. - strict ins/outs - She has been accepted to [facility] on a TTS schedule for HD.  She states this is far from her house so she is hoping that she can be transitioned closer to her home.   2.  Acute exacerbation of combined systolic/diastolic heart failure: optimizing volume with HD.  Continue lasix and can transition to lasix 80 mg PO daily on non-HD days after discharge (this would be lasix on Mon, Wed, Friday, and Sunday)   3.  Anemia of CKD: Secondary to chronic disease including chronic kidney disease - she has been on aranesp 100 mcg every Wednesday here and would increase to mircera 150 mcg every 2 weeks as an outpatient.  S/p ferrlecit  4.  Hypertension: optimizing volume with HD   5. Hyperphosphatemia - on renvela.  Start activated vit D outpatient   Disposition - from a strictly renal standpoint able to be discharged today after HD here (6/18).  Can start outpatient HD on 6/20    Subjective:   Strict ins/outs are not available over 6/17 - she had 1 unmeasured urine void.  Last HD on 6/17 with 2.5 kg UF.  She completed treatment in a chair and tolerated well.  She has been accepted to an outpatient HD unit and can start there on Thursday, 6/20.  She's  glad she started HD.  She hopes to be transferred to an HD unit closer to her home - she thinks maybe [facility] or [facility]?  Review of systems:   She denies shortness of breath - breathing is much better than before Denies chest pain  Denies nausea/vomiting     Objective:   BP (!) 149/76 (BP Location: Right Arm)   Pulse 61   Temp 97.7 F (36.5 C) (Oral)   Resp 18   Ht 5\' 5"  (1.651 m)   Wt 80.6 kg   SpO2 99%   BMI 29.57 kg/m   Intake/Output Summary (Last 24 hours) at 04/01/2023 0757 Last data filed at 04/01/2023 0000 Gross per 24 hour  Intake 476 ml  Output 2500 ml  Net -2024 ml   Weight change: -6.2 kg  Physical Exam:    General elderly female in bed in no acute distress HEENT normocephalic atraumatic extraocular movements intact sclera anicteric Neck supple trachea midline Lungs clear to auscultation bilaterally normal work of breathing at rest on room air  Heart S1S2 no rub Abdomen soft nontender nondistended Extremities 2-3+ edema lower extremities and left upper extremity; no edema right upper extremity Psych normal mood and affect Access LUE AVF bruit and thrill appreciated, swollen; RIJ tunneled dialysis catheter    Imaging: DG Chest 1 View  Result Date: 03/30/2023 CLINICAL DATA:  Follow-up, shortness of breath EXAM: CHEST  1 VIEW COMPARISON:  Chest x-ray  03/27/2023 FINDINGS: Small pleural effusions are again seen. Bibasilar infiltrates are unchanged. The heart is enlarged. There central pulmonary vascular congestion. No pneumothorax. Right-sided central venous catheter tip projects over the SVC. IMPRESSION: 1. Cardiomegaly with central pulmonary vascular congestion. 2. Small bilateral pleural effusions and bibasilar infiltrates, unchanged. Electronically Signed   By: Darliss Cheney M.D.   On: 03/30/2023 16:56    Labs: BMET Recent Labs  Lab 03/26/23 0012 03/27/23 0015 03/28/23 0054 03/29/23 0656 03/30/23 0052 04/01/23 0055  NA 138 136 135 135 133* 135  K 4.1  4.2 4.5 4.0 3.6 3.3*  CL 105 103 102 100 99 101  CO2 24 24 22 24 25 28   GLUCOSE 80 78 125* 87 86 87  BUN 76* 76* 79* 67* 51* 36*  CREATININE 4.00* 3.86* 3.93* 3.57* 2.94* 2.41*  CALCIUM 7.0* 7.0* 7.5* 7.2* 7.0* 7.1*  PHOS 6.8* 6.5* 6.8* 6.3* 4.7* 3.4   CBC Recent Labs  Lab 03/28/23 0730 03/29/23 0656 04/01/23 0055  WBC 3.5* 4.9 4.7  HGB 10.1* 8.9* 8.7*  HCT 32.5* 28.7* 27.9*  MCV 93.7 91.4 91.2  PLT 139* 118* 109*    Medications:     carvedilol  6.25 mg Oral BID WC   Chlorhexidine Gluconate Cloth  6 each Topical Q0600   darbepoetin (ARANESP) injection - NON-DIALYSIS  100 mcg Subcutaneous Q Wed-1800   folic acid  1 mg Oral Daily   furosemide  80 mg Oral Daily   heparin  5,000 Units Subcutaneous Q8H   hydrALAZINE  25 mg Oral Q8H   hydrocortisone cream   Topical BID   isosorbide mononitrate  30 mg Oral Daily   sevelamer carbonate  1,600 mg Oral TID WC   sodium chloride flush  3 mL Intravenous Q12H   Estanislado Emms, MD 04/01/2023, 8:24 AM

## 2023-04-01 NOTE — Progress Notes (Signed)
Contacted renal PA to request that orders be sent to out-pt HD clinic at pt's d/c. If pt is d/c today, pt will need to go to clinic tomorrow at 1:00 pm to complete paperwork in order to start on Thursday morning. Made pt's daughter aware of this information yesterday and arrangements added to pt's AVS as well. Will assist as needed.   Olivia Canter Renal Navigator [phone]  Addendum at 11:30 am: Spoke to pt via phone while receiving HD per pt request. Pt inquiring about going to a HD clinic closer to her home. Advised pt that the 2 clinics she is interested in are currently at capacity and cannot accept a new pt. Explained that pt can be placed on a waiting list for one of those clinics once pt starts at [facility]. Discussed out-pt HD arrangements with pt and reminded her of needing to go to clinic on Wed at 1:00 for paperwork. Pt voices understanding. Contacted [facility] to advise clinic pt's d/c after HD and will be at clinic tomorrow at 1:00 for paperwork and will start on Thursday. Clinic also advised that pt is requesting a clinic closer to home and will need to be placed on a waiting list at those clinics. Renal PA to send orders.

## 2023-04-01 NOTE — Procedures (Signed)
Seen and examined on dialysis.  Procedure supervised.  Blood pressure 151/75 and HR 61.  Tolerating goal.  Tunn catheter in use.  She is tolerating HD well in chair.   Estanislado Emms, MD 04/01/2023  11:09 AM

## 2023-04-02 ENCOUNTER — Telehealth: Payer: Self-pay

## 2023-04-02 ENCOUNTER — Telehealth: Payer: Self-pay | Admitting: *Deleted

## 2023-04-02 DIAGNOSIS — M1990 Unspecified osteoarthritis, unspecified site: Secondary | ICD-10-CM | POA: Diagnosis not present

## 2023-04-02 DIAGNOSIS — Z466 Encounter for fitting and adjustment of urinary device: Secondary | ICD-10-CM | POA: Diagnosis not present

## 2023-04-02 DIAGNOSIS — Z993 Dependence on wheelchair: Secondary | ICD-10-CM | POA: Diagnosis not present

## 2023-04-02 DIAGNOSIS — Z79899 Other long term (current) drug therapy: Secondary | ICD-10-CM | POA: Diagnosis not present

## 2023-04-02 DIAGNOSIS — I5032 Chronic diastolic (congestive) heart failure: Secondary | ICD-10-CM | POA: Diagnosis not present

## 2023-04-02 DIAGNOSIS — F411 Generalized anxiety disorder: Secondary | ICD-10-CM | POA: Diagnosis not present

## 2023-04-02 DIAGNOSIS — M25561 Pain in right knee: Secondary | ICD-10-CM | POA: Diagnosis not present

## 2023-04-02 DIAGNOSIS — Z87440 Personal history of urinary (tract) infections: Secondary | ICD-10-CM | POA: Diagnosis not present

## 2023-04-02 DIAGNOSIS — D631 Anemia in chronic kidney disease: Secondary | ICD-10-CM | POA: Diagnosis not present

## 2023-04-02 DIAGNOSIS — N186 End stage renal disease: Secondary | ICD-10-CM | POA: Diagnosis not present

## 2023-04-02 DIAGNOSIS — E785 Hyperlipidemia, unspecified: Secondary | ICD-10-CM | POA: Diagnosis not present

## 2023-04-02 DIAGNOSIS — I132 Hypertensive heart and chronic kidney disease with heart failure and with stage 5 chronic kidney disease, or end stage renal disease: Secondary | ICD-10-CM | POA: Diagnosis not present

## 2023-04-02 NOTE — Telephone Encounter (Signed)
Incoming call received from [facility] requesting verbal orders for continuation of therapy  2 x 1 and 1 x 1  Per [facility] standing order, verbal order given. Message will be sent to patient's provider as a FYI.

## 2023-04-02 NOTE — Progress Notes (Signed)
  Care Coordination Note  04/02/2023 Name: Kara Mullins MRN: [id number] DOB: November 04, 1950  Kara Mullins is a 72 y.o. year old female who is a primary care patient of [name], [name], NP and is actively engaged with the care management team. I reached out to [name] by phone today to assist with scheduling a follow up visit with the RN Case Manager  Follow up plan: Telephone appointment with care management team member scheduled for:04/04/23  [facility] Coordination Care Guide  Direct Dial: [phone]

## 2023-04-02 NOTE — Telephone Encounter (Signed)
Noted. Thanks.

## 2023-04-02 NOTE — Progress Notes (Signed)
  Care Coordination Note  04/02/2023 Name: Kara Mullins MRN: [id number] DOB: June 01, 1951  Kara Mullins is a 72 y.o. year old female who is a primary care patient of [name], [name], NP and is actively engaged with the care management team. I reached out to [name] by phone today to assist with scheduling a follow up visit with the RN Case Manager  Follow up plan: Unsuccessful telephone outreach attempt made. A HIPAA compliant phone message was left for the patient providing contact information and requesting a return call.   [facility]  Care Coordination Care Guide  Direct Dial: [phone]

## 2023-04-02 NOTE — Transitions of Care (Post Inpatient/ED Visit) (Signed)
04/02/2023  Name: Kara Mullins MRN: [id number] DOB: [birth year]  Today's TOC FU Call Status: Today's TOC FU Call Status:: Successful TOC FU Call Competed TOC FU Call Complete Date: 04/02/23  Transition Care Management Follow-up Telephone Call Date of Discharge: 04/01/23 Discharge Facility: [facility]) Type of Discharge: Inpatient Admission Primary Inpatient Discharge Diagnosis:: "long QT interval" How have you been since you were released from the hospital?: Better (Spoke w/ daughter-Terra. States pt doing well-just returning from visiting HD ctr and completing paperwork. Pt to srart oupt HD txs (T,T,S-chair time 6:40am) tomorrow. Appetite good. BM yest. Denies any pain. Drsg intact to sacral area.) Any questions or concerns?: No  Items Reviewed: Did you receive and understand the discharge instructions provided?: Yes Medications obtained,verified, and reconciled?: Yes (Medications Reviewed) Any new allergies since your discharge?: No Dietary orders reviewed?: Yes Type of Diet Ordered:: low salt/heat healthy Do you have support at home?: Yes People in Home: child(ren), adult Name of Support/Comfort Primary Source: Daughter-Terra  Medications Reviewed Today: Medications Reviewed Today     Reviewed by Charlyn Minerva, RN (Registered Nurse) on 04/02/23 at 1443  Med List Status: <None>   Medication Order Taking? Sig Documenting Provider Last Dose Status Informant  carvedilol (COREG) 6.25 MG tablet [id number] Yes Take 1 tablet (6.25 mg total) by mouth 2 (two) times daily with a meal. Almon Hercules, MD Taking Active   Coenzyme Q10 (CO Q 10 PO) [id number] Yes Take 1 capsule by mouth daily. [provider] Taking Active Child           Med Note Vonita Moss   Sat Mar 22, 2023 12:14 PM) Pt frequently refuses.  feeding supplement (ENSURE ENLIVE / ENSURE PLUS) LIQD [id number] No Take 237 mLs by mouth 2 (two) times daily between meals.  Patient not taking:  Reported on 04/02/2023   Barnetta Chapel, MD Not Taking Active Child, Pharmacy Records  ferrous sulfate 325 (65 FE) MG EC tablet [id number] Yes Take 1 tablet (325 mg total) by mouth 2 (two) times daily. Lanae Boast, MD Taking Active Child, Pharmacy Records  folic acid (FOLVITE) 1 MG tablet [id number] Yes Take 1 tablet (1 mg total) by mouth daily. Lanae Boast, MD Taking Active Child, Pharmacy Records  furosemide (LASIX) 80 MG tablet [id number] Yes Take 1 tablet (80 mg total) by mouth every Monday, Wednesday, Friday and Sunday. Almon Hercules, MD Taking Active   hydrALAZINE (APRESOLINE) 25 MG tablet [id number] Yes Take 1 tablet (25 mg total) by mouth every 8 (eight) hours. Almon Hercules, MD Taking Active Self  isosorbide mononitrate (IMDUR) 30 MG 24 hr tablet [id number] Yes Take 1 tablet (30 mg total) by mouth daily. Almon Hercules, MD Taking Active   Menthol-Methyl Salicylate (MUSCLE RUB) 10-15 % CREA [id number] Yes Apply 1 Application topically daily as needed for muscle pain. [provider] Taking Active Child, Pharmacy Records  multivitamin (RENA-VIT) TABS tablet [id number] Yes Take 1 tablet by mouth daily. [provider] Taking Active Child, Pharmacy Records           Med Note Vonita Moss   VVO Mar 22, 2023 12:14 PM) Ran out of medication within the past 7 days.  sevelamer carbonate (RENVELA) 800 MG tablet [id number] Yes Take 800 mg by mouth daily with supper. [provider] Taking Active Child, Pharmacy Records           Med Note Vonita Moss   YIR Mar 22, 2023 12:13 PM) Per family  at bedside, pt will only take 1 tablet daily.  sodium bicarbonate 650 MG tablet [id number] Yes Take 650 mg by mouth 2 (two) times daily. [provider] Taking Active Child, Pharmacy Records            Home Care and Equipment/Supplies: Were Home Health Services Ordered?: Yes Name of Home Health Agency:: [facility] Has Agency set up a time to come to your home?: Yes First  Home Health Visit Date: 04/02/23 (Daughter states HHPT called and is coming later today) Any new equipment or medical supplies ordered?: Yes Name of Medical supply agency?: [employer] Were you able to get the equipment/medical supplies?: Yes Do you have any questions related to the use of the equipment/supplies?: No  Functional Questionnaire: Do you need assistance with bathing/showering or dressing?: Yes Do you need assistance with meal preparation?: Yes Do you need assistance with eating?: No Do you have difficulty maintaining continence: No Do you need assistance with getting out of bed/getting out of a chair/moving?: Yes Do you have difficulty managing or taking your medications?: Yes  Follow up appointments reviewed: PCP Follow-up appointment confirmed?: Yes Date of PCP follow-up appointment?: 04/22/23 (Pt only wanted to see regular provider and daughter requested mychart/virtual visit to coordinate with her schedule and pt's HD txs-first available appt scheduled by care guide) Follow-up Provider: Carilyn Goodpasture [facility] Follow-up appointment confirmed?: Yes Date of Specialist follow-up appointment?: 04/21/23 Follow-Up Specialty Provider:: Scheryl Marten Do you need transportation to your follow-up appointment?: No (Daughter states family witll get her to follow up appts-working on application for [employer]) Do you understand care options if your condition(s) worsen?: Yes-patient verbalized understanding  SDOH Interventions Today    Flowsheet Row Most Recent Value  SDOH Interventions   Food Insecurity Interventions Intervention Not Indicated  Transportation Interventions Intervention Not Indicated  [Dtr states dialysis case worker gave her an application for [employer] today for her to complete to assist with getting pt to HD]      TOC Interventions Today    Flowsheet Row Most Recent Value  TOC Interventions   TOC Interventions Discussed/Reviewed TOC  Interventions Discussed, Arranged PCP follow up less than 12 days/Care Guide scheduled, Post discharge activity limitations per provider, S/S of infection      Interventions Today    Flowsheet Row Most Recent Value  Chronic Disease   Chronic disease during today's visit Chronic Kidney Disease/End Stage Renal Disease (ESRD)  General Interventions   General Interventions Discussed/Reviewed General Interventions Discussed, Doctor Visits, Durable Medical Equipment (DME)  Doctor Visits Discussed/Reviewed Doctor Visits Discussed, PCP, Specialist  Durable Medical Equipment (DME) Other, Wheelchair  [Dtr wants to see if pt qualifies for motorized wheelchair-will discuss with provider about completing paperwork]  PCP/Specialist Visits Compliance with follow-up visit  Education Interventions   Education Provided Provided Education  Provided Verbal Education On Nutrition, When to see the doctor, Medication, Other  [reviewed wound care instructions with daughter per d/c paperwork-she confirms she is able to perform wound care-will discuss with [facility] about get additional supplies]  Nutrition Interventions   Nutrition Discussed/Reviewed Nutrition Discussed, Adding fruits and vegetables, Increasing proteins, Decreasing salt  Pharmacy Interventions   Pharmacy Dicussed/Reviewed Pharmacy Topics Discussed, Medications and their functions  Safety Interventions   Safety Discussed/Reviewed Safety Discussed, Home Safety  Home Safety Assistive Devices       [place], [place] [facility] Care Management Care Management Community Coordinator Direct Phone: [phone] Toll Free: [phone] Fax: [phone]

## 2023-04-03 ENCOUNTER — Telehealth: Payer: Self-pay

## 2023-04-03 ENCOUNTER — Other Ambulatory Visit: Payer: Self-pay | Admitting: Adult Health

## 2023-04-03 DIAGNOSIS — D631 Anemia in chronic kidney disease: Secondary | ICD-10-CM | POA: Diagnosis not present

## 2023-04-03 DIAGNOSIS — N185 Chronic kidney disease, stage 5: Secondary | ICD-10-CM

## 2023-04-03 DIAGNOSIS — R5381 Other malaise: Secondary | ICD-10-CM

## 2023-04-03 DIAGNOSIS — N186 End stage renal disease: Secondary | ICD-10-CM | POA: Diagnosis not present

## 2023-04-03 DIAGNOSIS — I5042 Chronic combined systolic (congestive) and diastolic (congestive) heart failure: Secondary | ICD-10-CM

## 2023-04-03 DIAGNOSIS — A499 Bacterial infection, unspecified: Secondary | ICD-10-CM | POA: Diagnosis not present

## 2023-04-03 DIAGNOSIS — Z992 Dependence on renal dialysis: Secondary | ICD-10-CM | POA: Diagnosis not present

## 2023-04-03 DIAGNOSIS — D689 Coagulation defect, unspecified: Secondary | ICD-10-CM | POA: Diagnosis not present

## 2023-04-03 DIAGNOSIS — N2581 Secondary hyperparathyroidism of renal origin: Secondary | ICD-10-CM | POA: Diagnosis not present

## 2023-04-03 DIAGNOSIS — N181 Chronic kidney disease, stage 1: Secondary | ICD-10-CM | POA: Diagnosis not present

## 2023-04-03 NOTE — Telephone Encounter (Signed)
DME ordered

## 2023-04-03 NOTE — Telephone Encounter (Signed)
Patient's daughter called and requested a padding for hoyer lift for the patient send to [facility]. She didn't know what type of hoyer lift it was. Stated patient need a hoyer lift to get up for dialysis.

## 2023-04-04 ENCOUNTER — Ambulatory Visit: Payer: Self-pay

## 2023-04-04 NOTE — Patient Outreach (Signed)
  Care Coordination   Follow Up Visit Note   04/04/2023 Name: Kara Mullins MRN: [id number] DOB: 22-Jul-1951  Kara Mullins is a 72 y.o. year old female who sees [name], [name], NP for primary care. I spoke with daughter Lounette Sloan by phone today.  What matters to the patients health and wellness today?  Patient's daughter would like for her mother to do well and feel better on hemodialysis.     Goals Addressed             This Visit's Progress    I would like to keep my mom out of the hospital       Care Coordination Interventions: Assessed the POA daughter [name] understanding of ESRD    Evaluation of current treatment plan related to end stage renal disease and self management and patient's adherence to plan as established by provider  Determined patient has started hemodialysis at [facility] on TTS 11:50 am      Reviewed prescribed diet renal  Discussed the impact of end stage kidney disease on daily life and mental health and acknowledged and normalized feelings of disempowerment, fear, and frustration    Assessed social determinant of health barriers    Engage patient in early, proactive and ongoing discussion about goals of care and what matters most to them         COMPLETED: RN Care Coordination Activities: further follow up needed       Care Coordination Interventions: Reviewed chart in preparation to contact patient. Noted patient is currently at the [facility] Emergency Department at [facility] for shortness of breath    Interventions Today    Flowsheet Row Most Recent Value  Chronic Disease   Chronic disease during today's visit Chronic Kidney Disease/End Stage Renal Disease (ESRD)  General Interventions   General Interventions Discussed/Reviewed General Interventions Discussed, General Interventions Reviewed, Doctor Visits, Labs  Doctor Visits Discussed/Reviewed Doctor Visits Discussed, Doctor Visits Reviewed, PCP  Durable  Medical Equipment (DME) Other  Michiel Sites lift]  Education Interventions   Education Provided Provided Education  Provided Verbal Education On Nutrition  Nutrition Interventions   Nutrition Discussed/Reviewed Nutrition Discussed, Nutrition Reviewed, Fluid intake  [renal diet]  Safety Interventions   Safety Discussed/Reviewed Safety Discussed, Safety Reviewed  Home Safety Assistive Devices          SDOH assessments and interventions completed:  No     Care Coordination Interventions:  Yes, provided   Follow up plan: Follow up call scheduled for 05/16/23 @2 :30 PM    Encounter Outcome:  Pt. Visit Completed

## 2023-04-04 NOTE — Patient Instructions (Signed)
Visit Information  Thank you for taking time to visit with me today. Please don't hesitate to contact me if I can be of assistance to you.   Following are the goals we discussed today:   Goals Addressed             This Visit's Progress    I would like to keep my mom out of the hospital       Care Coordination Interventions: Assessed the POA daughter [name] understanding of ESRD    Evaluation of current treatment plan related to end stage renal disease and self management and patient's adherence to plan as established by provider  Determined patient has started hemodialysis at [facility] on TTS 11:50 am      Reviewed prescribed diet renal  Discussed the impact of end stage kidney disease on daily life and mental health and acknowledged and normalized feelings of disempowerment, fear, and frustration    Assessed social determinant of health barriers    Engage patient in early, proactive and ongoing discussion about goals of care and what matters most to them        COMPLETED: RN Care Coordination Activities: further follow up needed       Care Coordination Interventions: Reviewed chart in preparation to contact patient. Noted patient is currently at the [facility] Emergency Department at [facility] for shortness of breath        Our next appointment is by telephone on 05/16/23 at 2:30 PM  Please call the care guide team at [phone] if you need to cancel or reschedule your appointment.   If you are experiencing a Mental Health or Behavioral Health Crisis or need someone to talk to, please call 1-800-273-TALK (toll free, 24 hour hotline)  Patient verbalizes understanding of instructions and care plan provided today and agrees to view in MyChart. Active MyChart status and patient understanding of how to access instructions and care plan via MyChart confirmed with patient.     Delsa Sale, RN, BSN, CCM Care Management Coordinator [facility] Care  Management Direct Phone: [phone]

## 2023-04-07 DIAGNOSIS — E785 Hyperlipidemia, unspecified: Secondary | ICD-10-CM | POA: Diagnosis not present

## 2023-04-07 DIAGNOSIS — Z79899 Other long term (current) drug therapy: Secondary | ICD-10-CM | POA: Diagnosis not present

## 2023-04-07 DIAGNOSIS — I5032 Chronic diastolic (congestive) heart failure: Secondary | ICD-10-CM | POA: Diagnosis not present

## 2023-04-07 DIAGNOSIS — Z466 Encounter for fitting and adjustment of urinary device: Secondary | ICD-10-CM | POA: Diagnosis not present

## 2023-04-07 DIAGNOSIS — M25561 Pain in right knee: Secondary | ICD-10-CM | POA: Diagnosis not present

## 2023-04-07 DIAGNOSIS — Z993 Dependence on wheelchair: Secondary | ICD-10-CM | POA: Diagnosis not present

## 2023-04-07 DIAGNOSIS — I132 Hypertensive heart and chronic kidney disease with heart failure and with stage 5 chronic kidney disease, or end stage renal disease: Secondary | ICD-10-CM | POA: Diagnosis not present

## 2023-04-07 DIAGNOSIS — D631 Anemia in chronic kidney disease: Secondary | ICD-10-CM | POA: Diagnosis not present

## 2023-04-07 DIAGNOSIS — M1990 Unspecified osteoarthritis, unspecified site: Secondary | ICD-10-CM | POA: Diagnosis not present

## 2023-04-07 DIAGNOSIS — N186 End stage renal disease: Secondary | ICD-10-CM | POA: Diagnosis not present

## 2023-04-07 DIAGNOSIS — F411 Generalized anxiety disorder: Secondary | ICD-10-CM | POA: Diagnosis not present

## 2023-04-07 DIAGNOSIS — Z87440 Personal history of urinary (tract) infections: Secondary | ICD-10-CM | POA: Diagnosis not present

## 2023-04-08 ENCOUNTER — Telehealth: Payer: Self-pay

## 2023-04-08 DIAGNOSIS — Z79899 Other long term (current) drug therapy: Secondary | ICD-10-CM | POA: Diagnosis not present

## 2023-04-08 DIAGNOSIS — D631 Anemia in chronic kidney disease: Secondary | ICD-10-CM | POA: Diagnosis not present

## 2023-04-08 DIAGNOSIS — M25561 Pain in right knee: Secondary | ICD-10-CM | POA: Diagnosis not present

## 2023-04-08 DIAGNOSIS — M1990 Unspecified osteoarthritis, unspecified site: Secondary | ICD-10-CM | POA: Diagnosis not present

## 2023-04-08 DIAGNOSIS — I132 Hypertensive heart and chronic kidney disease with heart failure and with stage 5 chronic kidney disease, or end stage renal disease: Secondary | ICD-10-CM | POA: Diagnosis not present

## 2023-04-08 DIAGNOSIS — F411 Generalized anxiety disorder: Secondary | ICD-10-CM | POA: Diagnosis not present

## 2023-04-08 DIAGNOSIS — E785 Hyperlipidemia, unspecified: Secondary | ICD-10-CM | POA: Diagnosis not present

## 2023-04-08 DIAGNOSIS — Z993 Dependence on wheelchair: Secondary | ICD-10-CM | POA: Diagnosis not present

## 2023-04-08 DIAGNOSIS — Z87440 Personal history of urinary (tract) infections: Secondary | ICD-10-CM | POA: Diagnosis not present

## 2023-04-08 DIAGNOSIS — I5032 Chronic diastolic (congestive) heart failure: Secondary | ICD-10-CM | POA: Diagnosis not present

## 2023-04-08 DIAGNOSIS — N186 End stage renal disease: Secondary | ICD-10-CM | POA: Diagnosis not present

## 2023-04-08 DIAGNOSIS — Z466 Encounter for fitting and adjustment of urinary device: Secondary | ICD-10-CM | POA: Diagnosis not present

## 2023-04-08 NOTE — Telephone Encounter (Signed)
I received a call from chasity at [employer] requesting face to face notes for patient stating why patient needs hoyer lift and if patient already has a hoyer lift. Notes need to be faxed to [phone]  I placed an outgoing call to patient's daughter to clarify if patient has a hoyer lift or needs an order for a hoyer lift.  I am waiting on return call. If patient needs an order than patient will need to be scheduled for virtual visit.

## 2023-04-08 NOTE — Telephone Encounter (Signed)
error 

## 2023-04-09 DIAGNOSIS — Z7689 Persons encountering health services in other specified circumstances: Secondary | ICD-10-CM | POA: Diagnosis not present

## 2023-04-09 DIAGNOSIS — Z5982 Transportation insecurity: Secondary | ICD-10-CM | POA: Diagnosis not present

## 2023-04-09 DIAGNOSIS — I132 Hypertensive heart and chronic kidney disease with heart failure and with stage 5 chronic kidney disease, or end stage renal disease: Secondary | ICD-10-CM | POA: Diagnosis not present

## 2023-04-09 DIAGNOSIS — I5032 Chronic diastolic (congestive) heart failure: Secondary | ICD-10-CM | POA: Diagnosis not present

## 2023-04-09 DIAGNOSIS — Z993 Dependence on wheelchair: Secondary | ICD-10-CM | POA: Diagnosis not present

## 2023-04-09 DIAGNOSIS — N2581 Secondary hyperparathyroidism of renal origin: Secondary | ICD-10-CM | POA: Insufficient documentation

## 2023-04-09 DIAGNOSIS — L89151 Pressure ulcer of sacral region, stage 1: Secondary | ICD-10-CM | POA: Diagnosis not present

## 2023-04-09 DIAGNOSIS — N186 End stage renal disease: Secondary | ICD-10-CM | POA: Diagnosis not present

## 2023-04-09 DIAGNOSIS — Z992 Dependence on renal dialysis: Secondary | ICD-10-CM | POA: Diagnosis not present

## 2023-04-09 DIAGNOSIS — I4891 Unspecified atrial fibrillation: Secondary | ICD-10-CM | POA: Diagnosis not present

## 2023-04-11 ENCOUNTER — Telehealth: Payer: Self-pay

## 2023-04-11 DIAGNOSIS — N186 End stage renal disease: Secondary | ICD-10-CM | POA: Insufficient documentation

## 2023-04-11 NOTE — Telephone Encounter (Signed)
Lorena, nurse with [facility] called and left message on clinical intake voiemail requesting verbal orders for skilled nursing 1 time a week for 2 weeks and 2 prn visits.. Patient is now home from having dialysis port placed.  I returned call to [facility] and left message given verbal orders.  Message sent to [facility])

## 2023-04-11 NOTE — Telephone Encounter (Signed)
Outgoing call placed to patient's daughter again regarding hoyer lift. No answer and message left for her to call office. Waiting on call back

## 2023-04-13 DIAGNOSIS — I129 Hypertensive chronic kidney disease with stage 1 through stage 4 chronic kidney disease, or unspecified chronic kidney disease: Secondary | ICD-10-CM | POA: Diagnosis not present

## 2023-04-13 DIAGNOSIS — N186 End stage renal disease: Secondary | ICD-10-CM | POA: Diagnosis not present

## 2023-04-13 DIAGNOSIS — Z992 Dependence on renal dialysis: Secondary | ICD-10-CM | POA: Diagnosis not present

## 2023-04-14 DIAGNOSIS — I5032 Chronic diastolic (congestive) heart failure: Secondary | ICD-10-CM | POA: Diagnosis not present

## 2023-04-14 DIAGNOSIS — F411 Generalized anxiety disorder: Secondary | ICD-10-CM | POA: Diagnosis not present

## 2023-04-14 DIAGNOSIS — Z87440 Personal history of urinary (tract) infections: Secondary | ICD-10-CM | POA: Diagnosis not present

## 2023-04-14 DIAGNOSIS — M1990 Unspecified osteoarthritis, unspecified site: Secondary | ICD-10-CM | POA: Diagnosis not present

## 2023-04-14 DIAGNOSIS — E785 Hyperlipidemia, unspecified: Secondary | ICD-10-CM | POA: Diagnosis not present

## 2023-04-14 DIAGNOSIS — N186 End stage renal disease: Secondary | ICD-10-CM | POA: Diagnosis not present

## 2023-04-14 DIAGNOSIS — M25561 Pain in right knee: Secondary | ICD-10-CM | POA: Diagnosis not present

## 2023-04-14 DIAGNOSIS — Z466 Encounter for fitting and adjustment of urinary device: Secondary | ICD-10-CM | POA: Diagnosis not present

## 2023-04-14 DIAGNOSIS — D631 Anemia in chronic kidney disease: Secondary | ICD-10-CM | POA: Diagnosis not present

## 2023-04-14 DIAGNOSIS — Z993 Dependence on wheelchair: Secondary | ICD-10-CM | POA: Diagnosis not present

## 2023-04-14 DIAGNOSIS — Z79899 Other long term (current) drug therapy: Secondary | ICD-10-CM | POA: Diagnosis not present

## 2023-04-14 DIAGNOSIS — I132 Hypertensive heart and chronic kidney disease with heart failure and with stage 5 chronic kidney disease, or end stage renal disease: Secondary | ICD-10-CM | POA: Diagnosis not present

## 2023-04-14 NOTE — Telephone Encounter (Signed)
Daughter returned call and states that patient does not have Hoyer lift, so she will need an order for it. She would like to request an electrical lift. Patient's daughter stated that she will discuss on 7/9 appointment.

## 2023-04-15 DIAGNOSIS — A499 Bacterial infection, unspecified: Secondary | ICD-10-CM | POA: Diagnosis not present

## 2023-04-15 DIAGNOSIS — N186 End stage renal disease: Secondary | ICD-10-CM | POA: Diagnosis not present

## 2023-04-15 DIAGNOSIS — Z992 Dependence on renal dialysis: Secondary | ICD-10-CM | POA: Diagnosis not present

## 2023-04-15 DIAGNOSIS — D689 Coagulation defect, unspecified: Secondary | ICD-10-CM | POA: Diagnosis not present

## 2023-04-15 DIAGNOSIS — N181 Chronic kidney disease, stage 1: Secondary | ICD-10-CM | POA: Diagnosis not present

## 2023-04-15 DIAGNOSIS — D631 Anemia in chronic kidney disease: Secondary | ICD-10-CM | POA: Diagnosis not present

## 2023-04-15 DIAGNOSIS — N2581 Secondary hyperparathyroidism of renal origin: Secondary | ICD-10-CM | POA: Diagnosis not present

## 2023-04-16 DIAGNOSIS — Z993 Dependence on wheelchair: Secondary | ICD-10-CM | POA: Diagnosis not present

## 2023-04-16 DIAGNOSIS — I5032 Chronic diastolic (congestive) heart failure: Secondary | ICD-10-CM | POA: Diagnosis not present

## 2023-04-16 DIAGNOSIS — Z466 Encounter for fitting and adjustment of urinary device: Secondary | ICD-10-CM | POA: Diagnosis not present

## 2023-04-16 DIAGNOSIS — M25561 Pain in right knee: Secondary | ICD-10-CM | POA: Diagnosis not present

## 2023-04-16 DIAGNOSIS — D631 Anemia in chronic kidney disease: Secondary | ICD-10-CM | POA: Diagnosis not present

## 2023-04-16 DIAGNOSIS — Z79899 Other long term (current) drug therapy: Secondary | ICD-10-CM | POA: Diagnosis not present

## 2023-04-16 DIAGNOSIS — Z87440 Personal history of urinary (tract) infections: Secondary | ICD-10-CM | POA: Diagnosis not present

## 2023-04-16 DIAGNOSIS — N186 End stage renal disease: Secondary | ICD-10-CM | POA: Diagnosis not present

## 2023-04-16 DIAGNOSIS — E785 Hyperlipidemia, unspecified: Secondary | ICD-10-CM | POA: Diagnosis not present

## 2023-04-16 DIAGNOSIS — F411 Generalized anxiety disorder: Secondary | ICD-10-CM | POA: Diagnosis not present

## 2023-04-16 DIAGNOSIS — I132 Hypertensive heart and chronic kidney disease with heart failure and with stage 5 chronic kidney disease, or end stage renal disease: Secondary | ICD-10-CM | POA: Diagnosis not present

## 2023-04-16 DIAGNOSIS — M1990 Unspecified osteoarthritis, unspecified site: Secondary | ICD-10-CM | POA: Diagnosis not present

## 2023-04-18 ENCOUNTER — Telehealth: Payer: Self-pay

## 2023-04-18 NOTE — Telephone Encounter (Signed)
Kara Mullins from [facility] called and is requesting verbal orders for skilled nursing visits once a week for 9 weeks, and 2 as needed visits. Verbal orders given. Message sent as FYI.

## 2023-04-18 NOTE — Telephone Encounter (Signed)
Noted  

## 2023-04-20 NOTE — Progress Notes (Unsigned)
Office Visit    Patient Name: Kara Mullins Date of Encounter: 04/21/2023  Primary Care Provider:  Caesar Bookman, NP Primary Cardiologist:  [name], MD  Chief Complaint  72 year old female with a past medical history of hypertension, hyperlipidemia, chronic lymphedema, CKD stage IV and chronic combined systolic and diastolic heart failure with improved EF.  Presents today for follow-up regarding recent hospital admission for acute CHF.  Past Medical History    Past Medical History:  Diagnosis Date   Chronic combined systolic and diastolic congestive heart failure (HCC) 11/09/2021   Generalized anxiety disorder    Hyperlipidemia 11/09/2021   Hypertension    Stage 3b chronic kidney disease (CKD) (HCC) 11/09/2021   Past Surgical History:  Procedure Laterality Date   AV FISTULA PLACEMENT Left 03/27/2023   Procedure: LEFT ARM BRACHIOCEPHALIC ARTERIOVENOUS (AV) FISTULA CREATION;  Surgeon: [name], MD;  Location: [facility] OR;  Service: Vascular;  Laterality: Left;   CATARACT EXTRACTION, BILATERAL     combined systolic and diastolic congestive heart failure      INSERTION OF DIALYSIS CATHETER N/A 03/27/2023   Procedure: INSERTION OF RIGHT INTERNAL JUGULAR TUNNELED DIALYSIS CATHETER;  Surgeon: [name], MD;  Location: [facility] OR;  Service: Vascular;  Laterality: N/A;   Allergies Allergies  Allergen Reactions   Crestor [Rosuvastatin] Other (See Comments)    "Felt like she couldn't swallow"   Nitrofuran Derivatives Other (See Comments)    Unknown reaction   Labs/Other Studies Reviewed    The following studies were reviewed today: Cardiac Studies & Procedures       ECHOCARDIOGRAM  ECHOCARDIOGRAM LIMITED 03/23/2023  Narrative ECHOCARDIOGRAM LIMITED REPORT    Patient Name:   Kara Mullins Date of Exam: 03/23/2023 Medical Rec #:  [id number]      Height:       65.0 in Accession #:    [id number]     Weight:       196.6 lb Date of Birth:  10/03/1951     BSA:           1.964 m Patient Age:    71 years       BP:           139/63 mmHg Patient Gender: F              HR:           66 bpm. Exam Location:  Inpatient  Procedure: Strain Analysis, Limited Color Doppler and Limited Echo  Indications:     CHF-Acute Systolic I50.21  History:         Patient has prior history of Echocardiogram examinations, most recent 02/26/2023. CHF, Arrythmias:Atrial Fibrillation, Signs/Symptoms:Hypotension; Risk Factors:Hypertension and Dyslipidemia. CKD.  Sonographer:     [name] Referring Phys:  [id number] Ronnald Ramp O'NEAL Diagnosing Phys: Dina Rich MD  IMPRESSIONS   1. Left ventricular ejection fraction, by estimation, is 45 to 50%. The left ventricle has mildly decreased function. The left ventricle demonstrates global hypokinesis. 2. Right ventricular systolic function is normal. The right ventricular size is normal. There is moderately elevated pulmonary artery systolic pressure. 3. Large pleural effusion in the left lateral region. 4. Mild mitral valve regurgitation. 5. The tricuspid valve is abnormal. Tricuspid valve regurgitation is mild. 6. The aortic valve is bicuspid. There is mild calcification of the aortic valve. There is mild thickening of the aortic valve. Aortic valve regurgitation is mild. 7. The inferior vena cava is normal in size with <50% respiratory variability, suggesting  right atrial pressure of 8 mmHg. 8. Limited echo to evaluate LV function  FINDINGS Left Ventricle: Left ventricular ejection fraction, by estimation, is 45 to 50%. The left ventricle has mildly decreased function. The left ventricle demonstrates global hypokinesis. The left ventricular internal cavity size was normal in size. There is no left ventricular hypertrophy.  Right Ventricle: The right ventricular size is normal. Right vetricular wall thickness was not well visualized. Right ventricular systolic function is normal. There is moderately elevated  pulmonary artery systolic pressure. The tricuspid regurgitant velocity is 3.47 m/s, and with an assumed right atrial pressure of 8 mmHg, the estimated right ventricular systolic pressure is 56.2 mmHg.  Mitral Valve: There is mild thickening of the mitral valve leaflet(s). There is mild calcification of the mitral valve leaflet(s). Mild mitral annular calcification. Mild mitral valve regurgitation.  Tricuspid Valve: The tricuspid valve is abnormal. Tricuspid valve regurgitation is mild . No evidence of tricuspid stenosis.  Aortic Valve: The aortic valve is bicuspid. There is mild calcification of the aortic valve. There is mild thickening of the aortic valve. Aortic valve regurgitation is mild.  Aorta: The aortic root and ascending aorta are structurally normal, with no evidence of dilitation.  Venous: The inferior vena cava is normal in size with less than 50% respiratory variability, suggesting right atrial pressure of 8 mmHg.  Additional Comments: There is a large pleural effusion in the left lateral region.  LEFT VENTRICLE PLAX 2D LVIDd:         5.50 cm LVIDs:         4.00 cm LV PW:         0.90 cm LV IVS:        0.90 cm LVOT diam:     1.90 cm LVOT Area:     2.84 cm   IVC IVC diam: 1.60 cm  LEFT ATRIUM         Index LA diam:    4.30 cm 2.19 cm/m  AORTA Ao Root diam: 3.00 cm Ao Asc diam:  3.50 cm  TRICUSPID VALVE TR Peak grad:   48.2 mmHg TR Vmax:        347.00 cm/s  SHUNTS Systemic Diam: 1.90 cm  Dina Rich MD Electronically signed by Dina Rich MD Signature Date/Time: 03/23/2023/1:55:08 PM    Final (Updated)      CARDIAC MRI  MR CARDIAC MORPHOLOGY W WO CONTRAST 05/03/2021  Narrative CLINICAL DATA:  Evaluate for amyloidosis  EXAM: CARDIAC MRI  TECHNIQUE: The patient was scanned on a 1.5 Tesla Siemens magnet. A dedicated cardiac coil was used. Functional imaging was done using Fiesta sequences. 2,3, and 4 chamber views were done to assess for  RWMA's. Modified Simpson's rule using a short axis stack was used to calculate an ejection fraction on a dedicated work [employer]. The patient received cc of Gadavist. After 10 minutes inversion recovery sequences were used to assess for infiltration and scar tissue.  CONTRAST:  8 cc  of Gadavist  FINDINGS: Left ventricle:  -Asymmetric hypertrophy measuring up to 14mm in basal septum (7mm in posterior wall)  -Mild dilatation  -Low normal systolic function  -Elevated ECV (32%)  -No LGE  LV EF:  53% (Normal 56-78%)  Absolute volumes:  LV EDV: (Normal 52-141 mL)  LV ESV: 98mL (Normal 13-51 mL)  LV SV: (Normal 33-97 mL)  CO: 6.5L/min (Normal 2.7-6.0 L/min)  Indexed volumes:  LV EDV: 126mL/sq-m (Normal 41-81 mL/sq-m)  LV ESV: 94mL/sq-m (Normal 12-21 mL/sq-m)  LV SV: 60mL/sq-m (Normal 26-56 mL/sq-m)  CI: 3.4L/min/sq-m (Normal 1.8-3.8 L/min/sq-m)  Right ventricle: Normal size and systolic function  RV EF: 57% (Normal 47-80%)  Absolute volumes:  RV EDV: (Normal 58-154 mL)  RV ESV: 70mL (Normal 12-68 mL)  RV SV: 92mL (Normal 35-98 mL)  CO: 5.5L/min (Normal 2.7-6 L/min)  Indexed volumes:  RV EDV: 10mL/sq-m (Normal 48-87 mL/sq-m)  RV ESV: 34mL/sq-m (Normal 11-28 mL/sq-m)  RV SV: 78mL/sq-m (Normal 27-57 mL/sq-m)  CI: 2.9L/min/sq-m (Normal 1.8-3.8 L/min/sq-m)  Left atrium: Mild enlargement  Right atrium: Mild enlargement  Mitral valve: Mild to moderate regurgitation (not quantified)  Aortic valve: Tricuspid.  No regurgitation  Tricuspid valve: No regurgitation  Pulmonic valve: No regurgitation  Aorta: Normal proximal ascending aorta  Pericardium: Small effusion  Extracardiac structures: Bilateral pleural effusions  IMPRESSION: 1.  No evidence of cardiac amyloidosis  2. Asymmetric LV hypertrophy measuring up to 14mm in basal septum (7mm in posterior wall), not meeting criteria for  hypertrophic cardiomyopathy (<73mm)  3.  Mild LV dilatation with low normal systolic function (EF 53%)  4.  Normal RV size and systolic function (EF 57%)  5.  No late gadolinium enhancement to suggest myocardial scar   Electronically Signed By: Epifanio Lesches MD On: 05/03/2021 21:37        Recent Labs: 03/22/2023: B Natriuretic Peptide 715.1 03/24/2023: ALT 14; TSH 2.488 03/25/2023: Magnesium 2.6 04/01/2023: BUN 36; Creatinine, Ser 2.41; Hemoglobin 8.7; Platelets 109; Potassium 3.3; Sodium 135  Recent Lipid Panel    Component Value Date/Time   CHOL 212 (H) 08/27/2021 0956   TRIG 78 08/27/2021 0956   HDL 67 08/27/2021 0956   CHOLHDL 3.2 08/27/2021 0956   LDLCALC 128 (H) 08/27/2021 0956   History of Present Illness  72 year old female with a past medical history of hypertension, hyperlipidemia, chronic lymphedema, CKD stage IV and chronic combined systolic and diastolic heart failure with improved EF.  Ms. Hawbaker was admitted to [facility] on 03/05/2021 with shortness of breath.  Echocardiogram showed LVEF 45%, severe LVH, concerning for cardiac amyloid.  For amyloidosis showed abnormal light chains, hematology was consulted underwent bone marrow biopsy.  Her course is complicated by AKI, thought to be secondary to contrast nephropathy.  She was consulted and she was diuresed with IV Lasix with improvement in her kidney function.  Was discharged on oral Lasix 40 mg daily.  04/30/2021 she underwent cardiac MRI that showed no evidence of cardiac amyloidosis, moderate asymmetric LVH not meeting HCM criteria, LVEF 40 to 53%, RVEF 57%, no LGE.  February 2023 she was admitted with an AKI.  Baseline creatinine 1.36-1.6, was up to 4.7.  Discharge her creatinine was 4.3 allergy was consulted, stated not a good candidate for hemodialysis or aggressive intervention.  Nephrology felt that her AKI was likely secondary to ATN but could not rule out plasma cell dyscrasia.  She was found to have a  UTI and completed a 7-day course of meropenem.  In setting of AKI her Lasix, losartan and spironolactone were discontinued.  She was discharged on as needed Lasix and palliative care was consulted.    On 02/26/2023 she was admitted for acute CHF exacerbation, she recently received a blood transfusion and reported feeling significantly worse since.  He had been on p.o. torsemide that in setting of worsening renal function.  He reported worsening lower extremity edema and shortness of breath pressure was elevated at 186/96.  Creatinine had increased to 3.91, BNP 1037, troponin 23>>38, hemoglobin 10.3.  Chest  x-ray indicated bilateral pleural effusions, left greater than right and cardiomegaly with vascular congestion.  Cardiogram at that time demonstrated EF 50 to 55%, severe LVH, large pleural effusion mild MR.  Nephrology was consulted and diuresis was deferred to their control.  She was discharged on on 03/03/23 torsemide twice daily.  On 03/21/2023 she noted increasing dyspnea, presented to [facility] ED. Initial blood work showed a creatinine of 3.88 serial troponin was negative.  BNP was 715, hemoglobin 11.2 chest x-ray showed stable moderate to marked bilateral airspace disease with stable small to moderate size bilateral pleural effusions.  Echo on 03/23/2023 indicated LVEF of 45 to 50%, LV with mildly decreased function and global hypokinesis, moderately elevated pulmonary artery systolic pressure, large pleural effusion in the left lateral region, mild mitral valve regurgitation.  Nephrology was consulted she was started on dialysis. Vascular surgery placed an AV fistual on 6/13. She was discharged on 04/01/23, HD T/Th/Sat, nephrology recommended lasix 80mg  daily on M/W/F/Su.  She presents for hospital follow-up, reports she is doing very well overall, presents in wheelchair accompanied by her daughter.  Since starting dialysis her shortness of breath has greatly improved.  Notes her lower extremity edema is  still present but has also improved.  Currently has dialysis Tuesday, Thursday, Saturday, and reports she is tolerating well. She denies chest pain, palpitations, dyspnea, dizziness, or syncope.  Per daughter she only took her hydralazine this morning and has been increasingly stressed with new mode of transportation to appointments.  She does monitor her blood pressure at home notes that she is consistently below 140/90, after dialysis BP will decrease, can be as low as 110/60.  Home Medications    Current Outpatient Medications  Medication Sig Dispense Refill   carvedilol (COREG) 6.25 MG tablet Take 1 tablet (6.25 mg total) by mouth 2 (two) times daily with a meal. 180 tablet 1   Coenzyme Q10 (CO Q 10 PO) Take 1 capsule by mouth daily.     feeding supplement (ENSURE ENLIVE / ENSURE PLUS) LIQD Take 237 mLs by mouth 2 (two) times daily between meals. 237 mL 12   folic acid (FOLVITE) 1 MG tablet Take 1 tablet by mouth daily.     furosemide (LASIX) 80 MG tablet Take 1 tablet (80 mg total) by mouth every Monday, Wednesday, Friday and Sunday. 30 tablet 2   hydrALAZINE (APRESOLINE) 25 MG tablet Take 1 tablet (25 mg total) by mouth every 8 (eight) hours. 180 tablet 1   Menthol-Methyl Salicylate (MUSCLE RUB) 10-15 % CREA Apply 1 Application topically daily as needed for muscle pain.     multivitamin (RENA-VIT) TABS tablet Take 1 tablet by mouth daily.     sevelamer carbonate (RENVELA) 800 MG tablet Take 800 mg by mouth daily with supper.     ferrous sulfate 325 (65 FE) MG EC tablet Take 1 tablet (325 mg total) by mouth 2 (two) times daily. 60 tablet 0   isosorbide mononitrate (IMDUR) 30 MG 24 hr tablet Take 1 tablet (30 mg total) by mouth daily. 90 tablet 1   sodium bicarbonate 650 MG tablet Take 650 mg by mouth 2 (two) times daily.     No current facility-administered medications for this visit.    Review of Systems    She denies chest pain, palpitations, dyspnea, pnd, orthopnea, n, v, dizziness,  syncope, or early satiety. All other systems reviewed and are otherwise negative except as noted above.     Physical Exam    VS:  BP (!) 158/68   Pulse 65   Ht 5\' 5"  (1.651 m)   Wt 176 lb (79.8 kg)   SpO2 98%   BMI 29.29 kg/m  , BMI Body mass index is 29.29 kg/m.     GEN: Well nourished, well developed, in no acute distress. HEENT: normal. Neck: Supple, no JVD, carotid bruits, or masses. Cardiac: RRR, no murmurs, rubs, or gallops. No clubbing, cyanosis.  Radials/DP/PT 2+ and equal bilaterally. Lymphedema in bilateral lower extremities, no pitting edema. Left upper extremity AV fistula.  Respiratory:  Respirations regular and unlabored, clear to auscultation bilaterally. GI: Soft, nontender, nondistended, BS + x 4. MS: no deformity or atrophy. Skin: warm and dry, no rash. Neuro:  Strength and sensation are intact. Psych: Normal affect.  Accessory Clinical Findings    No EKG ordered today.   Lab Results  Component Value Date   WBC 4.7 04/01/2023   HGB 8.7 (L) 04/01/2023   HCT 27.9 (L) 04/01/2023   MCV 91.2 04/01/2023   PLT 109 (L) 04/01/2023   Lab Results  Component Value Date   CREATININE 2.41 (H) 04/01/2023   BUN 36 (H) 04/01/2023   NA 135 04/01/2023   K 3.3 (L) 04/01/2023   CL 101 04/01/2023   CO2 28 04/01/2023   Lab Results  Component Value Date   ALT 14 03/24/2023   AST 26 03/24/2023   ALKPHOS 68 03/24/2023   BILITOT 0.4 03/24/2023   Lab Results  Component Value Date   CHOL 212 (H) 08/27/2021   HDL 67 08/27/2021   LDLCALC 128 (H) 08/27/2021   TRIG 78 08/27/2021   CHOLHDL 3.2 08/27/2021    No results found for: "HGBA1C"  Assessment & Plan   Chronic systolic and diastolic heart failure: Echo on 03/23/2023 indicated LVEF of 45 to 50%, LV with mildly decreased function and global hypokinesis, moderately elevated pulmonary artery systolic pressure, large pleural effusion in the left lateral region, mild mitral valve regurgitation. Started on HD Tuesday,  Thursday and Saturday.  Reports her shortness of breath and lower extremity edema is greatly improved since starting HD.  Lower extremity edema is present but per daughter has improved greatly, known history of lymphedema.  Lung sounds are clear. Continue GDMT of carvedilol, furosemide, hydralazine. No ACE/Arb or spironolactone in setting of ESRD.   CKD stage IV/anemia: On HD Tuesday, Thursday and Saturday per nephrology.   Hypertension: Initial blood pressure today 170/66, repeat blood pressure 158/68.  Daughter reports she only took her hydralazine this morning, will take other blood pressure medications on return home. She does monitor her blood pressure at home notes that she is consistently below 140/90, after dialysis BP will decrease, can be as low as 110/60.  Discussed she does not need to take nighttime hydralazine if blood pressure is low after dialysis. Continue carvedilol, furosemide, imdur and hydralazine.   Qtc prolongation: Noted on previous EKG to have Qtc around 500, appears stable on EKGs from June admission. Avoid QT prolonging agents.   Paroxysmal afib: On review of EKGs from June she maintianed NSR with first degree AV block, no evidence of afib. Not on oral anticoagulation given significant history of anemia requiring transfusions and thrombocytopenia. Most recent CBC on 04/01/23 indicated hemoglobin of 8.7 and platelet count of 109. Continue Coreg 6.25mg  twice daily.   Hyperlipidemia: Last lipid panel on 08/2021 indicated total cholesterol of 212 and LDL of 128.  Previously tried Crestor, resulted in difficulty swallowing.  Check lipid panel and direct LDL today.  Given contraindication to statins if LDL is elevated requiring treatment can consider referral to Lipid clinic.  Disposition: Follow up with APP in three months.   HYPERTENSION CONTROL Vitals:   04/21/23 0950 04/21/23 1013  BP: (!) 170/66 (!) 158/68    The patient's blood pressure is elevated above target  today.  In order to address the patient's elevated BP: She only took her hydralazine this morning, has not taken other blood pressure medications yet.  Will take on return home.   Rip Harbour, NP 04/21/2023, 10:57 AM

## 2023-04-21 ENCOUNTER — Ambulatory Visit: Payer: Medicare (Managed Care) | Attending: General Practice | Admitting: Cardiology

## 2023-04-21 ENCOUNTER — Encounter: Payer: Self-pay | Admitting: General Practice

## 2023-04-21 VITALS — BP 158/68 | HR 65 | Ht 65.0 in | Wt 176.0 lb

## 2023-04-21 DIAGNOSIS — R9431 Abnormal electrocardiogram [ECG] [EKG]: Secondary | ICD-10-CM | POA: Diagnosis not present

## 2023-04-21 DIAGNOSIS — I480 Paroxysmal atrial fibrillation: Secondary | ICD-10-CM | POA: Diagnosis not present

## 2023-04-21 DIAGNOSIS — E785 Hyperlipidemia, unspecified: Secondary | ICD-10-CM

## 2023-04-21 DIAGNOSIS — I5042 Chronic combined systolic (congestive) and diastolic (congestive) heart failure: Secondary | ICD-10-CM | POA: Diagnosis not present

## 2023-04-21 DIAGNOSIS — N185 Chronic kidney disease, stage 5: Secondary | ICD-10-CM | POA: Diagnosis not present

## 2023-04-21 DIAGNOSIS — I10 Essential (primary) hypertension: Secondary | ICD-10-CM | POA: Diagnosis not present

## 2023-04-21 LAB — LIPID PANEL
Chol/HDL Ratio: 2.3 ratio (ref 0.0–4.4)
Cholesterol, Total: 179 mg/dL (ref 100–199)
HDL: 79 mg/dL (ref >39)
LDL Chol Calc (NIH): 91 mg/dL (ref 0–99)
Triglycerides: 45 mg/dL (ref 0–149)
VLDL Cholesterol Cal: 9 mg/dL (ref 5–40)

## 2023-04-21 LAB — LDL CHOLESTEROL, DIRECT: LDL Direct: 99 mg/dL (ref 0–99)

## 2023-04-21 MED ORDER — ISOSORBIDE MONONITRATE ER 30 MG PO TB24: 30.0000 mg | ORAL_TABLET | Freq: Every day | ORAL | 1 refills | Status: DC

## 2023-04-21 NOTE — Patient Instructions (Addendum)
Medication Instructions:  The current medical regimen is effective;  continue present plan and medications as directed. Please refer to the Current Medication list given to you today.  *If you need a refill on your cardiac medications before your next appointment, please call your pharmacy*  Lab Work: DIR LDL and LIPID If you have labs (blood work) drawn today and your tests are completely normal, you will receive your results only by:  MyChart Message (if you have MyChart) OR  A paper copy in the mail If you have any lab test that is abnormal or we need to change your treatment, we will call you to review the results.  Testing/Procedures: NONE  Follow-Up: At [facility], you and your health needs are our priority.  As part of our continuing mission to provide you with exceptional heart care, we have created designated Provider Care Teams.  These Care Teams include your primary Cardiologist (physician) and Advanced Practice Providers (APPs -  Physician Assistants and Nurse Practitioners) who all work together to provide you with the care you need, when you need it.  Your next appointment:   3 month(s) 07-30-23  Provider:   Melina Modena, FNP WITH Bernadene Person, NP      Other Instructions

## 2023-04-22 ENCOUNTER — Telehealth (INDEPENDENT_AMBULATORY_CARE_PROVIDER_SITE_OTHER): Payer: Medicare (Managed Care) | Admitting: Family

## 2023-04-22 ENCOUNTER — Encounter: Payer: Self-pay | Admitting: Family

## 2023-04-22 ENCOUNTER — Telehealth: Payer: Self-pay

## 2023-04-22 DIAGNOSIS — I10 Essential (primary) hypertension: Secondary | ICD-10-CM

## 2023-04-22 DIAGNOSIS — I5042 Chronic combined systolic (congestive) and diastolic (congestive) heart failure: Secondary | ICD-10-CM

## 2023-04-22 MED ORDER — FUROSEMIDE 80 MG PO TABS
ORAL_TABLET | ORAL | 1 refills | Status: DC
Start: 2023-04-22 — End: 2023-11-20

## 2023-04-22 MED ORDER — ISOSORBIDE MONONITRATE ER 30 MG PO TB24: 30.0000 mg | ORAL_TABLET | Freq: Every day | ORAL | 1 refills | Status: DC

## 2023-04-22 NOTE — Progress Notes (Unsigned)
This service is provided via telemedicine  No vital signs collected/recorded due to the encounter was a telemedicine visit.   Location of patient (ex: home, work):  home  Patient consents to a telephone visit:  yes  Location of the provider (ex: office, home):  [employer] [facility]  Names of all persons participating in the telemedicine service and their role in the encounter:  patient, patients daughter, Guss Bunde [facility], Charish Schroepfer. Jamarius Saha NP  Time spent on call:  5    Provider: Tanette Chauca FNP-C  Kessa Fairbairn, [name], NP  Patient Care Team: Lumen Brinlee, [name], NP as PCP - General (Family Medicine) [name], MD as PCP - Cardiology (Cardiology) Clarene Duke Karma Lew, RN as [facility] Care Management  Extended Emergency Contact Information Primary Emergency Contact: [facility] Phone: [phone] Relation: Daughter Secondary Emergency Contact: Chamberlin,Curtis Mobile Phone: [phone] Relation: Son  Code Status:  Full Code  Goals of care: Advanced Directive information    03/22/2023    3:00 PM  Advanced Directives  Does Patient Have a Medical Advance Directive? Yes  Type of Advance Directive Healthcare Power of Attorney  Does patient want to make changes to medical advance directive? No - Patient declined  Copy of Healthcare Power of Attorney in Chart? No - copy requested  Would patient like information on creating a medical advance directive? No - Patient declined     Chief Complaint  Patient presents with   Hospitalization Follow-up    Patient is here for follow up after hospital stay     HPI:  Pt is a 72 y.o. female seen today for Hospitalization follow up from 03/22/2023 - 04/01/2023 after presenting with shortness of breath.  She was treated for acute heart failure.  BNP was elevated 715 close treated with IV Lasix.  Checks x-ray was consistent with congestive heart failure.  Her renal function remained  elevated and nephrology was consulted.  AV fistula was placed by vascular surgery on 03/27/2023 and will schedule for outpatient hemodialysis on Tuesday Thursday and Saturday.  Condition was stable was discharged home to follow-up outpatient dialysis on 04/03/2023,Nephrology and vascular surgery and PCP.  Of note patient had refused dialysis in the past. She states shortness of breath has resolved with dialysis and no lower extremity edema. Home health Nurse once a week managing sacral ulcer which daughter states has improved. Also has Physical Therapy twice   Daughter requests electric lift recliner to elevate legs and assist in getting in and out of the chair.  Stat lab work done during dialysis.  Will obtain records  Past Medical History:  Diagnosis Date   Chronic combined systolic and diastolic congestive heart failure (HCC) 11/09/2021   Generalized anxiety disorder    Hyperlipidemia 11/09/2021   Hypertension    Stage 3b chronic kidney disease (CKD) (HCC) 11/09/2021   Past Surgical History:  Procedure Laterality Date   AV FISTULA PLACEMENT Left 03/27/2023   Procedure: LEFT ARM BRACHIOCEPHALIC ARTERIOVENOUS (AV) FISTULA CREATION;  Surgeon: [name], MD;  Location: [facility] OR;  Service: Vascular;  Laterality: Left;   CATARACT EXTRACTION, BILATERAL     combined systolic and diastolic congestive heart failure      INSERTION OF DIALYSIS CATHETER N/A 03/27/2023   Procedure: INSERTION OF RIGHT INTERNAL JUGULAR TUNNELED DIALYSIS CATHETER;  Surgeon: [name], MD;  Location: [facility] OR;  Service: Vascular;  Laterality: N/A;    Allergies  Allergen Reactions   Crestor [Rosuvastatin] Other (See Comments)    "  Felt like she couldn't swallow"   Nitrofuran Derivatives Other (See Comments)    Unknown reaction    Outpatient Encounter Medications as of 04/22/2023  Medication Sig   carvedilol (COREG) 6.25 MG tablet Take 1 tablet (6.25 mg total) by mouth 2 (two) times daily with a meal.    Coenzyme Q10 (CO Q 10 PO) Take 1 capsule by mouth daily.   feeding supplement (ENSURE ENLIVE / ENSURE PLUS) LIQD Take 237 mLs by mouth 2 (two) times daily between meals.   ferrous sulfate 325 (65 FE) MG EC tablet Take 1 tablet (325 mg total) by mouth 2 (two) times daily.   folic acid (FOLVITE) 1 MG tablet Take 1 tablet by mouth daily.   furosemide (LASIX) 80 MG tablet Take 1 tablet (80 mg total) by mouth every Monday, Wednesday, Friday and Sunday.   hydrALAZINE (APRESOLINE) 25 MG tablet Take 1 tablet (25 mg total) by mouth every 8 (eight) hours.   isosorbide mononitrate (IMDUR) 30 MG 24 hr tablet Take 1 tablet (30 mg total) by mouth daily.   Menthol-Methyl Salicylate (MUSCLE RUB) 10-15 % CREA Apply 1 Application topically daily as needed for muscle pain.   multivitamin (RENA-VIT) TABS tablet Take 1 tablet by mouth daily.   sevelamer carbonate (RENVELA) 800 MG tablet Take 800 mg by mouth daily with supper.   sodium bicarbonate 650 MG tablet Take 650 mg by mouth 2 (two) times daily.   No facility-administered encounter medications on file as of 04/22/2023.    Review of Systems  Constitutional:  Negative for appetite change, chills, fatigue, fever and unexpected weight change.  HENT:  Negative for congestion, dental problem, ear discharge, ear pain, facial swelling, hearing loss, nosebleeds, postnasal drip, rhinorrhea, sinus pressure, sinus pain, sneezing, sore throat, tinnitus and trouble swallowing.   Eyes:  Negative for pain, discharge, redness, itching and visual disturbance.  Respiratory:  Negative for cough, chest tightness, shortness of breath and wheezing.        Shortness of breath has improved since she started on dialysis   Cardiovascular:  Negative for chest pain, palpitations and leg swelling.  Gastrointestinal:  Negative for abdominal distention, abdominal pain, blood in stool, constipation, diarrhea, nausea and vomiting.  Endocrine: Negative for cold intolerance, heat intolerance,  polydipsia, polyphagia and polyuria.  Genitourinary:  Negative for difficulty urinating, dysuria, flank pain, frequency and urgency.       On Hemodialysis TTHS  Musculoskeletal:  Positive for gait problem. Negative for arthralgias, back pain, joint swelling, myalgias, neck pain and neck stiffness.  Skin:  Negative for color change, pallor, rash and wound.  Neurological:  Negative for dizziness, syncope, speech difficulty, weakness, light-headedness, numbness and headaches.  Hematological:  Does not bruise/bleed easily.  Psychiatric/Behavioral:  Negative for agitation, behavioral problems, confusion, hallucinations, self-injury, sleep disturbance and suicidal ideas. The patient is not nervous/anxious.     Immunization History  Administered Date(s) Administered   PFIZER Comirnaty(Gray Top)Covid-19 Tri-Sucrose Vaccine 04/28/2021   PFIZER(Purple Top)SARS-COV-2 Vaccination 12/16/2019, 01/12/2020   Unspecified SARS-COV-2 Vaccination 08/21/2021   Zoster Recombinant(Shingrix) 07/09/2021, 08/29/2021   Pertinent  Health Maintenance Due  Topic Date Due   MAMMOGRAM  01/27/2024 (Originally 08/05/2002)   DEXA SCAN  01/27/2024 (Originally 08/05/2017)   Colonoscopy  01/27/2024 (Originally 08/05/1997)   INFLUENZA VACCINE  05/15/2023      12 /22/2023   11:22 AM 10/04/2022   11:50 AM 10/28/2022    9:43 AM 01/27/2023    2:04 PM 03/05/2023    9:04 AM  Fall Risk  Falls in the past year?   0 0 Exclusion - non ambulatory  Was there an injury with Fall?   0 0 0  Fall Risk Category Calculator   0 0 0  Fall Risk Category (Retired)   Low    (RETIRED) Patient Fall Risk Level High fall risk High fall risk     Patient at Risk for Falls Due to   No Fall Risks  History of fall(s);Impaired balance/gait;Impaired mobility  Fall risk Follow up   Falls evaluation completed;Education provided;Falls prevention discussed Falls evaluation completed;Education provided;Falls prevention discussed Falls evaluation completed    Functional Status Survey:    There were no vitals filed for this visit. There is no height or weight on file to calculate BMI. Physical Exam Constitutional:      General: She is not in acute distress.    Appearance: She is not ill-appearing.  Pulmonary:     Effort: Pulmonary effort is normal. No respiratory distress.  Musculoskeletal:        General: Normal range of motion.  Neurological:     Mental Status: She is alert and oriented to person, place, and time.  Psychiatric:        Mood and Affect: Mood normal.        Behavior: Behavior normal.     Labs reviewed: Recent Labs    02/26/23 0108 02/27/23 0130 03/02/23 0112 03/03/23 0038 03/25/23 1212 03/26/23 0012 03/29/23 0656 03/30/23 0052 04/01/23 0055  NA 140   < > 137   < >  --    < > 135 133* 135  K 3.4*   < > 3.8   < >  --    < > 4.0 3.6 3.3*  CL 111   < > 107   < >  --    < > 100 99 101  CO2 19*   < > 21*   < >  --    < > 24 25 28   GLUCOSE 88   < > 104*   < >  --    < > 87 86 87  BUN 79*   < > 80*   < >  --    < > 67* 51* 36*  CREATININE 3.94*   < > 4.06*   < >  --    < > 3.57* 2.94* 2.41*  CALCIUM 7.1*   < > 7.3*   < >  --    < > 7.2* 7.0* 7.1*  MG 2.8*  --  2.5*  --  2.6*  --   --   --   --   PHOS  --   --   --   --   --    < > 6.3* 4.7* 3.4   < > = values in this interval not displayed.   Recent Labs    10/04/22 1249 10/29/22 0000 03/23/23 0102 03/24/23 0050 03/26/23 0012 03/29/23 0656 03/30/23 0052 04/01/23 0055  AST 29  --  23 26  --   --   --   --   ALT 21  --  14 14  --   --   --   --   ALKPHOS 91  --  73 68  --   --   --   --   BILITOT 0.3  --  0.4 0.4  --   --   --   --   PROT 6.2*  --  5.3* 5.3*  --   --   --   --  ALBUMIN 1.9*   < > 1.8* 1.8*   < > 2.0* 1.9* 1.6*   < > = values in this interval not displayed.   Recent Labs    10/04/22 1249 10/29/22 0000 02/25/23 1226 02/26/23 0108 03/22/23 0050 03/23/23 0105 03/28/23 0730 03/29/23 0656 04/01/23 0055  WBC 6.1   < > 4.1   < >  3.1*   < > 3.5* 4.9 4.7  NEUTROABS 4.2  --  3.0  --  1.9  --   --   --   --   HGB 8.1*   < > 10.3*   < > 11.2*   < > 10.1* 8.9* 8.7*  HCT 26.0*   < > 34.0*   < > 36.3   < > 32.5* 28.7* 27.9*  MCV 97.4  --  94.2   < > 90.3   < > 93.7 91.4 91.2  PLT 236   < > 192   < > 171   < > 139* 118* 109*   < > = values in this interval not displayed.   Lab Results  Component Value Date   TSH 2.488 03/24/2023   No results found for: "HGBA1C" Lab Results  Component Value Date   CHOL 179 04/21/2023   HDL 79 04/21/2023   LDLCALC 91 04/21/2023   LDLDIRECT 99 04/21/2023   TRIG 45 04/21/2023   CHOLHDL 2.3 04/21/2023    Significant Diagnostic Results in last 30 days:  DG Chest 1 View  Result Date: 03/30/2023 CLINICAL DATA:  Follow-up, shortness of breath EXAM: CHEST  1 VIEW COMPARISON:  Chest x-ray 03/27/2023 FINDINGS: Small pleural effusions are again seen. Bibasilar infiltrates are unchanged. The heart is enlarged. There central pulmonary vascular congestion. No pneumothorax. Right-sided central venous catheter tip projects over the SVC. IMPRESSION: 1. Cardiomegaly with central pulmonary vascular congestion. 2. Small bilateral pleural effusions and bibasilar infiltrates, unchanged. Electronically Signed   By: Darliss Cheney M.D.   On: 03/30/2023 16:56   DG CHEST PORT 1 VIEW  Result Date: 03/27/2023 CLINICAL DATA:  Dialysis catheter insertion EXAM: PORTABLE CHEST 1 VIEW COMPARISON:  Chest radiograph 03/22/2023 FINDINGS: There is new right-sided dialysis catheter in place terminating in the region of the cavoatrial junction. The heart is enlarged, unchanged. The upper mediastinal contours are stable, with calcified plaque in the thoracic aorta. Moderate-sized bilateral pleural effusions with bibasilar airspace opacity are likely not significantly changed compared to the prior study, allowing for slight differences in positioning and redistribution. There is likely mild pulmonary interstitial edema. There is  no pneumothorax There is no acute osseous abnormality. IMPRESSION: 1. New right-sided dialysis catheter in place terminating in the region of the cavoatrial junction. 2. Bilateral pleural effusions with adjacent airspace opacity, and mild pulmonary interstitial edema, likely not significantly changed since the study from 03/22/2023. Electronically Signed   By: Lesia Hausen M.D.   On: 03/27/2023 16:34   DG C-Arm 1-60 Min-No Report  Result Date: 03/27/2023 Fluoroscopy was utilized by the requesting physician.  No radiographic interpretation.   VAS [place] UPPER EXT VEIN MAPPING (PRE-OP AVF)  Result Date: 03/26/2023 UPPER EXTREMITY VEIN MAPPING Patient Name:  TAARA JENCKS  Date of Exam:   03/26/2023 Medical Rec #: [id number]       Accession #:    [id number] Date of Birth: 01/25/1951      Patient Gender: F Patient Age:   65 years Exam Location:  [facility] Procedure:      VAS [place] UPPER EXT VEIN  MAPPING (PRE-OP AVF) Referring Phys: JAY PATEL --------------------------------------------------------------------------------  Indications: Pre-access. History: Significant of hypertension, hyperlipidemia, diastolic congestive heart          failure, CKD stage V, and chronic left arm swelling.  Performing Technologist: Marilynne Halsted RDMS, RVT  Examination Guidelines: A complete evaluation includes B-mode imaging, spectral Doppler, color Doppler, and power Doppler as needed of all accessible portions of each vessel. Bilateral testing is considered an integral part of a complete examination. Limited examinations for reoccurring indications may be performed as noted. +-----------------+-------------+----------+----------------+ Right Cephalic   Diameter (cm)Depth (cm)    Findings     +-----------------+-------------+----------+----------------+ Shoulder             0.37                                +-----------------+-------------+----------+----------------+ Prox upper arm       0.37        0.27                     +-----------------+-------------+----------+----------------+ Mid upper arm        0.22        0.14                    +-----------------+-------------+----------+----------------+ Dist upper arm       0.35        0.20      branching     +-----------------+-------------+----------+----------------+ Antecubital fossa    0.39        0.18   partial thrombus +-----------------+-------------+----------+----------------+ Prox forearm         0.33        0.22      branching     +-----------------+-------------+----------+----------------+ Mid forearm          0.31        0.12                    +-----------------+-------------+----------+----------------+ Dist forearm         0.28        0.18                    +-----------------+-------------+----------+----------------+ Wrist                0.29        0.14                    +-----------------+-------------+----------+----------------+ +-----------------+-------------+----------+---------+ Right Basilic    Diameter (cm)Depth (cm)Findings  +-----------------+-------------+----------+---------+ Shoulder             0.68                         +-----------------+-------------+----------+---------+ Prox upper arm       0.56               branching +-----------------+-------------+----------+---------+ Mid upper arm        0.41                         +-----------------+-------------+----------+---------+ Dist upper arm       0.48                         +-----------------+-------------+----------+---------+ Antecubital fossa    0.49                         +-----------------+-------------+----------+---------+  Prox forearm         0.38               branching +-----------------+-------------+----------+---------+ Mid forearm          0.37                         +-----------------+-------------+----------+---------+ Distal forearm       0.30                          +-----------------+-------------+----------+---------+ +-----------------+-------------+----------+-----------------------+ Left Cephalic    Diameter (cm)Depth (cm)       Findings         +-----------------+-------------+----------+-----------------------+ Shoulder             0.39        0.28                           +-----------------+-------------+----------+-----------------------+ Prox upper arm       0.32        0.23                           +-----------------+-------------+----------+-----------------------+ Mid upper arm        0.37        0.29                           +-----------------+-------------+----------+-----------------------+ Dist upper arm       0.45        0.45                           +-----------------+-------------+----------+-----------------------+ Antecubital fossa    0.34        0.70   branching and 0.52/0.49 +-----------------+-------------+----------+-----------------------+ Prox forearm         0.39        1.00                           +-----------------+-------------+----------+-----------------------+ Mid forearm          0.35        1.00                           +-----------------+-------------+----------+-----------------------+ Dist forearm         0.29        0.37                           +-----------------+-------------+----------+-----------------------+ Wrist                0.24        0.30                           +-----------------+-------------+----------+-----------------------+ +-----------------+-------------+----------+--------+ Left Basilic     Diameter (cm)Depth (cm)Findings +-----------------+-------------+----------+--------+ Shoulder             0.58                        +-----------------+-------------+----------+--------+ Prox upper arm       0.58                        +-----------------+-------------+----------+--------+  Mid upper arm        0.45                         +-----------------+-------------+----------+--------+ Dist upper arm       0.43                        +-----------------+-------------+----------+--------+ Antecubital fossa    0.49                        +-----------------+-------------+----------+--------+ Prox forearm         0.29                        +-----------------+-------------+----------+--------+ Mid forearm          0.28                        +-----------------+-------------+----------+--------+ Distal forearm       0.25                        +-----------------+-------------+----------+--------+ Moderate edema throughout left arm. *See table(s) above for measurements and observations.  Diagnosing physician: Heath Lark Electronically signed by Heath Lark on 03/26/2023 at 4:57:12 PM.    Final     Assessment/Plan 1. Chronic combined systolic and diastolic CHF (congestive heart failure) (HCC) Shortness of breath has improved with dialysis and diuretic.  No edema reported. Continue on furosemide and Imdur - furosemide (LASIX) 80 MG tablet; Take 1 tablet (80 mg total) by mouth every Monday, Wednesday, Friday and Sunday.  Dispense: 90 tablet; Refill: 1 - isosorbide mononitrate (IMDUR) 30 MG 24 hr tablet; Take 1 tablet (30 mg total) by mouth daily.  Dispense: 90 tablet; Refill: 1 - For home use only DME Other see comment requires electric recliner chair to assist with getting in and out over the chair and elevating lower extremities.  Order faxed by CMA to DME company.  2. Essential hypertension B/p within normal range at dialysis  -Continue on hydralazine, Imdur, Lasix and carvedilol   Family/ staff Communication: Reviewed plan of care with patient and daughter verbalized understanding  Labs/tests ordered: None done during dialysis  Next Appointment: Return if symptoms worsen or fail to improve.  I connected with  [name] on 04/22/23 by a video enabled telemedicine application and verified that I am  speaking with the correct person using two identifiers.   I discussed the limitations of evaluation and management by telemedicine. The patient expressed understanding and agreed to proceed.   Spent 20 minutes of face to face with patient  >50% time spent counseling; reviewing medical record; tests; labs; and developing future plan of care.  Caesar Bookman, NP

## 2023-04-22 NOTE — Telephone Encounter (Signed)
Kelly,PT with [facility] left message on clinical intake voicemail requesting verbal orders to extend PT for 1 time a week for 8 weeks.  PT for tranfers, strengthening and standing time.  I returned call and left message on confidential voicemail given verbal orders.  Message sent to [name], NP [facility])

## 2023-04-22 NOTE — Telephone Encounter (Signed)
Noted  

## 2023-04-23 DIAGNOSIS — Z87440 Personal history of urinary (tract) infections: Secondary | ICD-10-CM | POA: Diagnosis not present

## 2023-04-23 DIAGNOSIS — M1990 Unspecified osteoarthritis, unspecified site: Secondary | ICD-10-CM | POA: Diagnosis not present

## 2023-04-23 DIAGNOSIS — E785 Hyperlipidemia, unspecified: Secondary | ICD-10-CM | POA: Diagnosis not present

## 2023-04-23 DIAGNOSIS — Z6830 Body mass index (BMI) 30.0-30.9, adult: Secondary | ICD-10-CM | POA: Diagnosis not present

## 2023-04-23 DIAGNOSIS — I890 Lymphedema, not elsewhere classified: Secondary | ICD-10-CM | POA: Diagnosis not present

## 2023-04-23 DIAGNOSIS — Z635 Disruption of family by separation and divorce: Secondary | ICD-10-CM | POA: Diagnosis not present

## 2023-04-23 DIAGNOSIS — E669 Obesity, unspecified: Secondary | ICD-10-CM | POA: Diagnosis not present

## 2023-04-23 DIAGNOSIS — I132 Hypertensive heart and chronic kidney disease with heart failure and with stage 5 chronic kidney disease, or end stage renal disease: Secondary | ICD-10-CM | POA: Diagnosis not present

## 2023-04-23 DIAGNOSIS — Z9181 History of falling: Secondary | ICD-10-CM | POA: Diagnosis not present

## 2023-04-23 DIAGNOSIS — Z556 Problems related to health literacy: Secondary | ICD-10-CM | POA: Diagnosis not present

## 2023-04-23 DIAGNOSIS — Z993 Dependence on wheelchair: Secondary | ICD-10-CM | POA: Diagnosis not present

## 2023-04-23 DIAGNOSIS — I110 Hypertensive heart disease with heart failure: Secondary | ICD-10-CM | POA: Diagnosis not present

## 2023-04-23 DIAGNOSIS — Z604 Social exclusion and rejection: Secondary | ICD-10-CM | POA: Diagnosis not present

## 2023-04-24 ENCOUNTER — Other Ambulatory Visit: Payer: Self-pay | Admitting: *Deleted

## 2023-04-24 DIAGNOSIS — E669 Obesity, unspecified: Secondary | ICD-10-CM | POA: Diagnosis not present

## 2023-04-24 DIAGNOSIS — Z993 Dependence on wheelchair: Secondary | ICD-10-CM | POA: Diagnosis not present

## 2023-04-24 DIAGNOSIS — I132 Hypertensive heart and chronic kidney disease with heart failure and with stage 5 chronic kidney disease, or end stage renal disease: Secondary | ICD-10-CM | POA: Diagnosis not present

## 2023-04-24 DIAGNOSIS — Z556 Problems related to health literacy: Secondary | ICD-10-CM | POA: Diagnosis not present

## 2023-04-24 DIAGNOSIS — I110 Hypertensive heart disease with heart failure: Secondary | ICD-10-CM | POA: Diagnosis not present

## 2023-04-24 DIAGNOSIS — Z87440 Personal history of urinary (tract) infections: Secondary | ICD-10-CM | POA: Diagnosis not present

## 2023-04-24 DIAGNOSIS — N185 Chronic kidney disease, stage 5: Secondary | ICD-10-CM

## 2023-04-24 DIAGNOSIS — I890 Lymphedema, not elsewhere classified: Secondary | ICD-10-CM | POA: Diagnosis not present

## 2023-04-24 DIAGNOSIS — Z9181 History of falling: Secondary | ICD-10-CM | POA: Diagnosis not present

## 2023-04-24 DIAGNOSIS — E785 Hyperlipidemia, unspecified: Secondary | ICD-10-CM | POA: Diagnosis not present

## 2023-04-24 DIAGNOSIS — Z6830 Body mass index (BMI) 30.0-30.9, adult: Secondary | ICD-10-CM | POA: Diagnosis not present

## 2023-04-24 DIAGNOSIS — M1990 Unspecified osteoarthritis, unspecified site: Secondary | ICD-10-CM | POA: Diagnosis not present

## 2023-04-24 DIAGNOSIS — Z635 Disruption of family by separation and divorce: Secondary | ICD-10-CM | POA: Diagnosis not present

## 2023-04-24 DIAGNOSIS — Z604 Social exclusion and rejection: Secondary | ICD-10-CM | POA: Diagnosis not present

## 2023-04-26 DIAGNOSIS — L89152 Pressure ulcer of sacral region, stage 2: Secondary | ICD-10-CM | POA: Diagnosis not present

## 2023-04-29 DIAGNOSIS — E785 Hyperlipidemia, unspecified: Secondary | ICD-10-CM | POA: Diagnosis not present

## 2023-04-29 DIAGNOSIS — Z9181 History of falling: Secondary | ICD-10-CM | POA: Diagnosis not present

## 2023-04-29 DIAGNOSIS — Z87440 Personal history of urinary (tract) infections: Secondary | ICD-10-CM | POA: Diagnosis not present

## 2023-04-29 DIAGNOSIS — Z556 Problems related to health literacy: Secondary | ICD-10-CM | POA: Diagnosis not present

## 2023-04-29 DIAGNOSIS — E669 Obesity, unspecified: Secondary | ICD-10-CM | POA: Diagnosis not present

## 2023-04-29 DIAGNOSIS — Z604 Social exclusion and rejection: Secondary | ICD-10-CM | POA: Diagnosis not present

## 2023-04-29 DIAGNOSIS — I890 Lymphedema, not elsewhere classified: Secondary | ICD-10-CM | POA: Diagnosis not present

## 2023-04-29 DIAGNOSIS — M1990 Unspecified osteoarthritis, unspecified site: Secondary | ICD-10-CM | POA: Diagnosis not present

## 2023-04-29 DIAGNOSIS — Z635 Disruption of family by separation and divorce: Secondary | ICD-10-CM | POA: Diagnosis not present

## 2023-04-29 DIAGNOSIS — I110 Hypertensive heart disease with heart failure: Secondary | ICD-10-CM | POA: Diagnosis not present

## 2023-04-29 DIAGNOSIS — I132 Hypertensive heart and chronic kidney disease with heart failure and with stage 5 chronic kidney disease, or end stage renal disease: Secondary | ICD-10-CM | POA: Diagnosis not present

## 2023-04-29 DIAGNOSIS — Z6830 Body mass index (BMI) 30.0-30.9, adult: Secondary | ICD-10-CM | POA: Diagnosis not present

## 2023-04-29 DIAGNOSIS — Z993 Dependence on wheelchair: Secondary | ICD-10-CM | POA: Diagnosis not present

## 2023-05-04 ENCOUNTER — Telehealth: Payer: Medicare (Managed Care) | Admitting: Family

## 2023-05-04 NOTE — Telephone Encounter (Signed)
-----   Message from W[place] Hotrontk L sent at 04/22/2023  4:08 PM EDT ----- [employer] - denial of order

## 2023-05-04 NOTE — Telephone Encounter (Signed)
Message received from DME company [employer] does not provide Human resources officer chair.POA to pick up prescription to obtain recliner at retail store then follow up with insurance for reimbursement or coverage.

## 2023-05-05 NOTE — Telephone Encounter (Signed)
I will forward to [facility] May, CMA in clinical intake today.

## 2023-05-05 NOTE — Telephone Encounter (Signed)
Delice Bison, Daughter, notified and agreed.

## 2023-05-07 DIAGNOSIS — I110 Hypertensive heart disease with heart failure: Secondary | ICD-10-CM | POA: Diagnosis not present

## 2023-05-07 DIAGNOSIS — Z6830 Body mass index (BMI) 30.0-30.9, adult: Secondary | ICD-10-CM | POA: Diagnosis not present

## 2023-05-07 DIAGNOSIS — M1990 Unspecified osteoarthritis, unspecified site: Secondary | ICD-10-CM | POA: Diagnosis not present

## 2023-05-07 DIAGNOSIS — Z604 Social exclusion and rejection: Secondary | ICD-10-CM | POA: Diagnosis not present

## 2023-05-07 DIAGNOSIS — Z9181 History of falling: Secondary | ICD-10-CM | POA: Diagnosis not present

## 2023-05-07 DIAGNOSIS — I890 Lymphedema, not elsewhere classified: Secondary | ICD-10-CM | POA: Diagnosis not present

## 2023-05-07 DIAGNOSIS — E785 Hyperlipidemia, unspecified: Secondary | ICD-10-CM | POA: Diagnosis not present

## 2023-05-07 DIAGNOSIS — I132 Hypertensive heart and chronic kidney disease with heart failure and with stage 5 chronic kidney disease, or end stage renal disease: Secondary | ICD-10-CM | POA: Diagnosis not present

## 2023-05-07 DIAGNOSIS — E669 Obesity, unspecified: Secondary | ICD-10-CM | POA: Diagnosis not present

## 2023-05-07 DIAGNOSIS — Z993 Dependence on wheelchair: Secondary | ICD-10-CM | POA: Diagnosis not present

## 2023-05-07 DIAGNOSIS — Z635 Disruption of family by separation and divorce: Secondary | ICD-10-CM | POA: Diagnosis not present

## 2023-05-07 DIAGNOSIS — Z87440 Personal history of urinary (tract) infections: Secondary | ICD-10-CM | POA: Diagnosis not present

## 2023-05-07 DIAGNOSIS — Z556 Problems related to health literacy: Secondary | ICD-10-CM | POA: Diagnosis not present

## 2023-05-08 ENCOUNTER — Telehealth: Payer: Self-pay

## 2023-05-08 DIAGNOSIS — E785 Hyperlipidemia, unspecified: Secondary | ICD-10-CM | POA: Diagnosis not present

## 2023-05-08 DIAGNOSIS — I110 Hypertensive heart disease with heart failure: Secondary | ICD-10-CM | POA: Diagnosis not present

## 2023-05-08 DIAGNOSIS — Z6830 Body mass index (BMI) 30.0-30.9, adult: Secondary | ICD-10-CM | POA: Diagnosis not present

## 2023-05-08 DIAGNOSIS — E669 Obesity, unspecified: Secondary | ICD-10-CM | POA: Diagnosis not present

## 2023-05-08 DIAGNOSIS — Z604 Social exclusion and rejection: Secondary | ICD-10-CM | POA: Diagnosis not present

## 2023-05-08 DIAGNOSIS — Z556 Problems related to health literacy: Secondary | ICD-10-CM | POA: Diagnosis not present

## 2023-05-08 DIAGNOSIS — Z9181 History of falling: Secondary | ICD-10-CM | POA: Diagnosis not present

## 2023-05-08 DIAGNOSIS — Z993 Dependence on wheelchair: Secondary | ICD-10-CM | POA: Diagnosis not present

## 2023-05-08 DIAGNOSIS — M1990 Unspecified osteoarthritis, unspecified site: Secondary | ICD-10-CM | POA: Diagnosis not present

## 2023-05-08 DIAGNOSIS — Z635 Disruption of family by separation and divorce: Secondary | ICD-10-CM | POA: Diagnosis not present

## 2023-05-08 DIAGNOSIS — I890 Lymphedema, not elsewhere classified: Secondary | ICD-10-CM | POA: Diagnosis not present

## 2023-05-08 DIAGNOSIS — Z87440 Personal history of urinary (tract) infections: Secondary | ICD-10-CM | POA: Diagnosis not present

## 2023-05-08 DIAGNOSIS — I132 Hypertensive heart and chronic kidney disease with heart failure and with stage 5 chronic kidney disease, or end stage renal disease: Secondary | ICD-10-CM | POA: Diagnosis not present

## 2023-05-08 NOTE — Telephone Encounter (Signed)
[name] with [facility] called and left and message on clinical intake voicemail regarding patient's medication and dialysis. She called concerning metazolone. Medication is discontinued.  I tried calling [name] back, but no answer. I left message for to give the office a call back.

## 2023-05-09 MED ORDER — CARVEDILOL 6.25 MG PO TABS: 3.1250 mg | ORAL_TABLET | Freq: Two times a day (BID) | ORAL | Status: DC

## 2023-05-13 ENCOUNTER — Ambulatory Visit (HOSPITAL_COMMUNITY)
Admission: RE | Admit: 2023-05-13 | Discharge: 2023-05-13 | Disposition: A | Discharge status: Home / Self Care | Payer: Medicare (Managed Care) | Source: Ambulatory Visit | Attending: Vascular Surgery | Admitting: Vascular Surgery

## 2023-05-13 ENCOUNTER — Ambulatory Visit (INDEPENDENT_AMBULATORY_CARE_PROVIDER_SITE_OTHER): Payer: Medicare (Managed Care) | Admitting: Physician Assistant

## 2023-05-13 VITALS — BP 149/71 | HR 59 | Temp 98.0°F | Ht 65.0 in | Wt 163.5 lb

## 2023-05-13 DIAGNOSIS — N185 Chronic kidney disease, stage 5: Secondary | ICD-10-CM | POA: Insufficient documentation

## 2023-05-13 DIAGNOSIS — N186 End stage renal disease: Secondary | ICD-10-CM

## 2023-05-13 DIAGNOSIS — Z992 Dependence on renal dialysis: Secondary | ICD-10-CM

## 2023-05-13 NOTE — Progress Notes (Signed)
POST OPERATIVE OFFICE NOTE    CC:  F/u for surgery  HPI:  This is a 72 y.o. female who is s/p left BC AVF and TDC placement on 03/27/2023 by Dr. Lenell Antu.   Pt states she does not have pain/numbness in the left hand.    The pt is on dialysis T/T/S at [facility] [address] location.   Allergies  Allergen Reactions   Crestor [Rosuvastatin] Other (See Comments)    "Felt like she couldn't swallow"   Nitrofuran Derivatives Other (See Comments)    Unknown reaction    Current Outpatient Medications  Medication Sig Dispense Refill   carvedilol (COREG) 6.25 MG tablet Take 0.5 tablets (3.125 mg total) by mouth 2 (two) times daily with a meal.     Coenzyme Q10 (CO Q 10 PO) Take 1 capsule by mouth daily.     feeding supplement (ENSURE ENLIVE / ENSURE PLUS) LIQD Take 237 mLs by mouth 2 (two) times daily between meals. 237 mL 12   folic acid (FOLVITE) 1 MG tablet Take 1 tablet by mouth daily.     furosemide (LASIX) 80 MG tablet Take 1 tablet (80 mg total) by mouth every Monday, Wednesday, Friday and Sunday. 90 tablet 1   hydrALAZINE (APRESOLINE) 25 MG tablet Take 1 tablet (25 mg total) by mouth every 8 (eight) hours. 180 tablet 1   isosorbide mononitrate (IMDUR) 30 MG 24 hr tablet Take 1 tablet (30 mg total) by mouth daily. 90 tablet 1   Menthol-Methyl Salicylate (MUSCLE RUB) 10-15 % CREA Apply 1 Application topically daily as needed for muscle pain.     multivitamin (RENA-VIT) TABS tablet Take 1 tablet by mouth daily.     sevelamer carbonate (RENVELA) 800 MG tablet Take 800 mg by mouth daily with supper.     No current facility-administered medications for this visit.     ROS:  See HPI  Physical Exam:  Today\'s Vitals   05/13/23 1328  BP: (!) 149/71  Pulse: (!) 59  Temp: 98 F (36.7 C)  TempSrc: Temporal  SpO2: 96%  Weight: 163 lb 8 oz (74.2 kg)  Height: 5\' 5" (1.651 m)  PainSc: 0-No pain   Body mass index is 27.21 kg/m.   Incision:  well healed Extremities:   There is a palpable  left radial pulse.   Motor and sensory are in tact.   There is a thrill/bruit present.  Access is  easily palpable   Dialysis Duplex on 05/13/2023: Findings:  +--------------------+----------+-----------------+--------+  AVF                PSV (cm/s)Flow Vol (mL/min)Comments  +--------------------+----------+-----------------+--------+  Native artery inflow   298           785                 +--------------------+----------+-----------------+--------+  AVF Anastomosis        458                               +--------------------+----------+-----------------+--------+    +------------+----------+-------------+----------+------------------+  OUTFLOW VEINPSV (cm/s)Diameter (cm)Depth (cm)     Describe       +------------+----------+-------------+----------+------------------+  Prox UA        13 0        0.59        0.20                       +------------+----------+-------------+----------+------------------+  Mid  UA         454        0.54        0.27   change in Diameter  +------------+----------+-------------+----------+------------------+  Dist UA        165        0.68        0.18                       +------------+----------+-------------+----------+------------------+  AC Fossa       296        0.78        0.24                       +------------+----------+-------------+----------+------------------+   Summary:  Patent arteriovenous fistula.  Arteriovenous fistula-Elevated velocities noted.     Assessment/Plan:  This is a 72 y.o. female who is s/p:  left BC AVF and TDC placement on 03/27/2023 by Dr. Lenell Antu.   -the pt does not have evidence of steal. -the fistula is maturing nicely.  There is an elevated velocity in the mid upper arm.  The fistula does not feel pulsatile and is maturing and appears somewhat tortuous.   -pt's access can be used 06/27/2023.  If there is any trouble with access at that time, we will be glad to see  her back for possible fistulogram. -if pt has tunneled catheter, this can be removed at the discretion of the dialysis center once the pt's access has been successfully cannulated to their satisfaction.  -discussed with pt that access does not last forever and will need intervention or even new access at some point.  -the pt will follow up as needed   Doreatha Massed, [facility] [phone]  Clinic MD:  Lenell Antu

## 2023-05-14 DIAGNOSIS — N186 End stage renal disease: Secondary | ICD-10-CM | POA: Diagnosis not present

## 2023-05-14 DIAGNOSIS — Z992 Dependence on renal dialysis: Secondary | ICD-10-CM | POA: Diagnosis not present

## 2023-05-14 DIAGNOSIS — I129 Hypertensive chronic kidney disease with stage 1 through stage 4 chronic kidney disease, or unspecified chronic kidney disease: Secondary | ICD-10-CM | POA: Diagnosis not present

## 2023-05-15 DIAGNOSIS — Z604 Social exclusion and rejection: Secondary | ICD-10-CM | POA: Diagnosis not present

## 2023-05-15 DIAGNOSIS — N186 End stage renal disease: Secondary | ICD-10-CM | POA: Diagnosis not present

## 2023-05-15 DIAGNOSIS — N2581 Secondary hyperparathyroidism of renal origin: Secondary | ICD-10-CM | POA: Diagnosis not present

## 2023-05-15 DIAGNOSIS — Z87440 Personal history of urinary (tract) infections: Secondary | ICD-10-CM | POA: Diagnosis not present

## 2023-05-15 DIAGNOSIS — D631 Anemia in chronic kidney disease: Secondary | ICD-10-CM | POA: Diagnosis not present

## 2023-05-15 DIAGNOSIS — Z9181 History of falling: Secondary | ICD-10-CM | POA: Diagnosis not present

## 2023-05-15 DIAGNOSIS — Z635 Disruption of family by separation and divorce: Secondary | ICD-10-CM | POA: Diagnosis not present

## 2023-05-15 DIAGNOSIS — I132 Hypertensive heart and chronic kidney disease with heart failure and with stage 5 chronic kidney disease, or end stage renal disease: Secondary | ICD-10-CM | POA: Diagnosis not present

## 2023-05-15 DIAGNOSIS — I110 Hypertensive heart disease with heart failure: Secondary | ICD-10-CM | POA: Diagnosis not present

## 2023-05-15 DIAGNOSIS — N181 Chronic kidney disease, stage 1: Secondary | ICD-10-CM | POA: Diagnosis not present

## 2023-05-15 DIAGNOSIS — E785 Hyperlipidemia, unspecified: Secondary | ICD-10-CM | POA: Diagnosis not present

## 2023-05-15 DIAGNOSIS — Z6830 Body mass index (BMI) 30.0-30.9, adult: Secondary | ICD-10-CM | POA: Diagnosis not present

## 2023-05-15 DIAGNOSIS — Z993 Dependence on wheelchair: Secondary | ICD-10-CM | POA: Diagnosis not present

## 2023-05-15 DIAGNOSIS — E669 Obesity, unspecified: Secondary | ICD-10-CM | POA: Diagnosis not present

## 2023-05-15 DIAGNOSIS — Z992 Dependence on renal dialysis: Secondary | ICD-10-CM | POA: Diagnosis not present

## 2023-05-15 DIAGNOSIS — M1990 Unspecified osteoarthritis, unspecified site: Secondary | ICD-10-CM | POA: Diagnosis not present

## 2023-05-15 DIAGNOSIS — Z556 Problems related to health literacy: Secondary | ICD-10-CM | POA: Diagnosis not present

## 2023-05-15 DIAGNOSIS — D689 Coagulation defect, unspecified: Secondary | ICD-10-CM | POA: Diagnosis not present

## 2023-05-15 DIAGNOSIS — I890 Lymphedema, not elsewhere classified: Secondary | ICD-10-CM | POA: Diagnosis not present

## 2023-05-15 DIAGNOSIS — A499 Bacterial infection, unspecified: Secondary | ICD-10-CM | POA: Diagnosis not present

## 2023-05-21 DIAGNOSIS — M1990 Unspecified osteoarthritis, unspecified site: Secondary | ICD-10-CM | POA: Diagnosis not present

## 2023-05-21 DIAGNOSIS — I890 Lymphedema, not elsewhere classified: Secondary | ICD-10-CM | POA: Diagnosis not present

## 2023-05-21 DIAGNOSIS — Z87440 Personal history of urinary (tract) infections: Secondary | ICD-10-CM | POA: Diagnosis not present

## 2023-05-21 DIAGNOSIS — Z993 Dependence on wheelchair: Secondary | ICD-10-CM | POA: Diagnosis not present

## 2023-05-21 DIAGNOSIS — Z556 Problems related to health literacy: Secondary | ICD-10-CM | POA: Diagnosis not present

## 2023-05-21 DIAGNOSIS — Z604 Social exclusion and rejection: Secondary | ICD-10-CM | POA: Diagnosis not present

## 2023-05-21 DIAGNOSIS — Z6830 Body mass index (BMI) 30.0-30.9, adult: Secondary | ICD-10-CM | POA: Diagnosis not present

## 2023-05-21 DIAGNOSIS — Z635 Disruption of family by separation and divorce: Secondary | ICD-10-CM | POA: Diagnosis not present

## 2023-05-21 DIAGNOSIS — Z9181 History of falling: Secondary | ICD-10-CM | POA: Diagnosis not present

## 2023-05-21 DIAGNOSIS — I132 Hypertensive heart and chronic kidney disease with heart failure and with stage 5 chronic kidney disease, or end stage renal disease: Secondary | ICD-10-CM | POA: Diagnosis not present

## 2023-05-21 DIAGNOSIS — E785 Hyperlipidemia, unspecified: Secondary | ICD-10-CM | POA: Diagnosis not present

## 2023-05-21 DIAGNOSIS — I110 Hypertensive heart disease with heart failure: Secondary | ICD-10-CM | POA: Diagnosis not present

## 2023-05-21 DIAGNOSIS — E669 Obesity, unspecified: Secondary | ICD-10-CM | POA: Diagnosis not present

## 2023-05-21 IMAGING — CT CT ANGIO CHEST
2 of 6 series · 18 of 36 positions shown · IV contrast (omnipaque)
Comparison: Chest x-ray 03/04/2021

CLINICAL DATA: Pt presents from home with daughter, c/o n/v and SOB
starting this evening. Denies CP, reports some abd discomfort. Last
BM today. States she has not had home BP meds today.

EXAM:
CT ANGIOGRAPHY CHEST WITH CONTRAST
TECHNIQUE: Multidetector CT imaging of the chest was performed using the
standard protocol during bolus administration of intravenous
contrast. Multiplanar CT image reconstructions and MIPs were
obtained to evaluate the vascular anatomy.
CONTRAST:  100mL OMNIPAQUE IOHEXOL 350 MG/ML SOLN

[Series 5: thins · axial · 0.78mm/px · z∈[-286,-44]mm · 17 of 274 slices shown]
[im 16/274  lung]
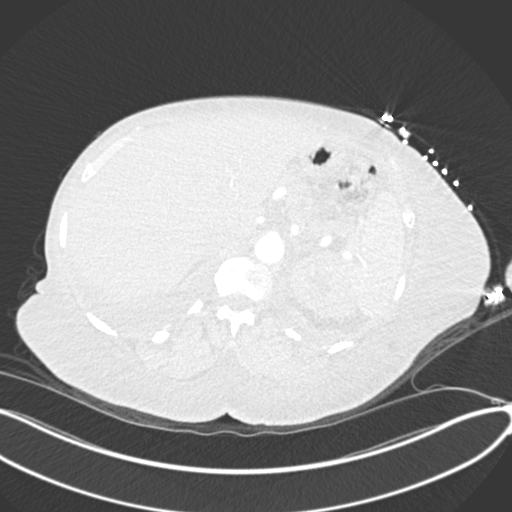
[im 31/274  mediastinal]
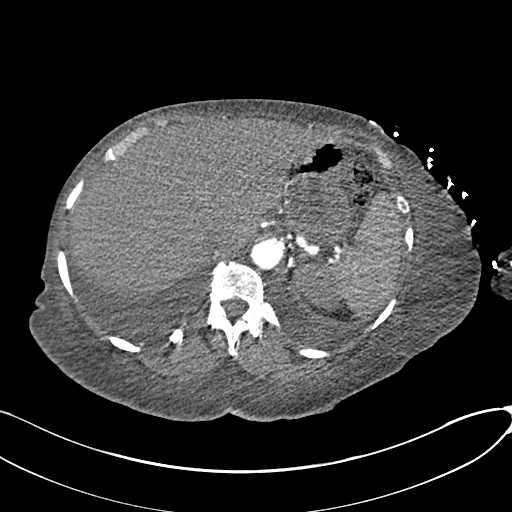
[im 46/274  lung]
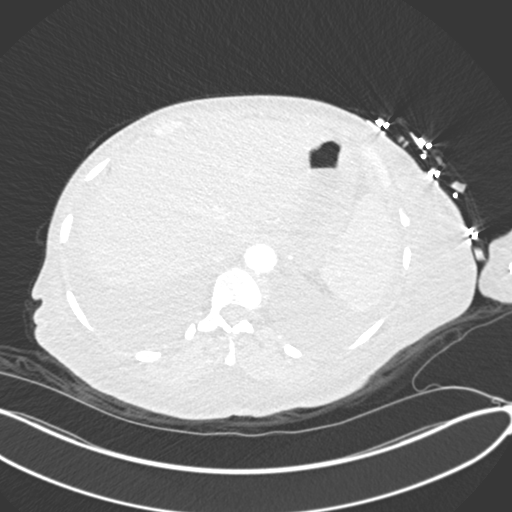
[im 61/274  mediastinal]
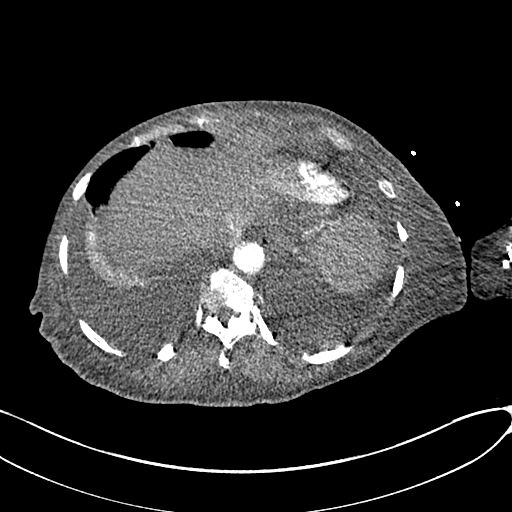
[im 76/274  lung]
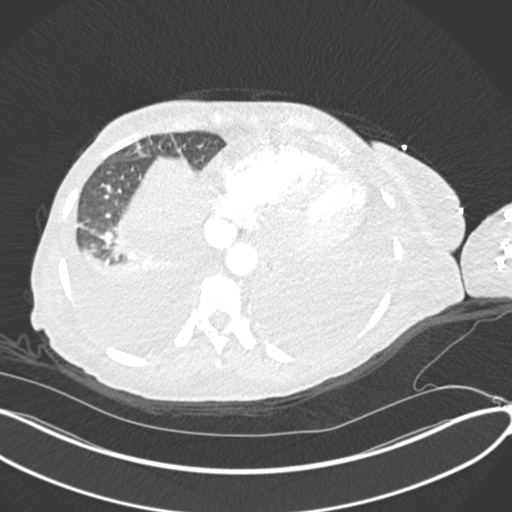
[im 92/274  mediastinal]
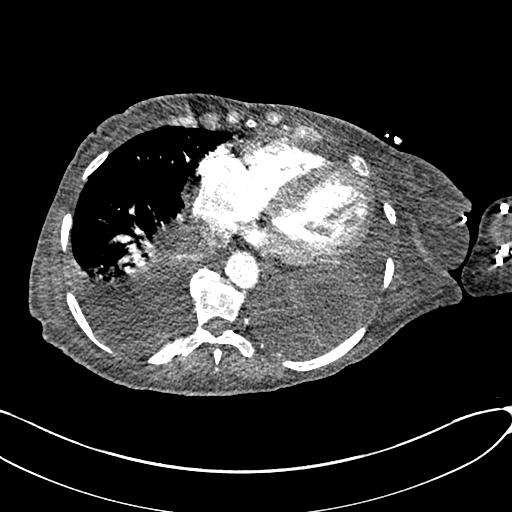
[im 107/274  lung]
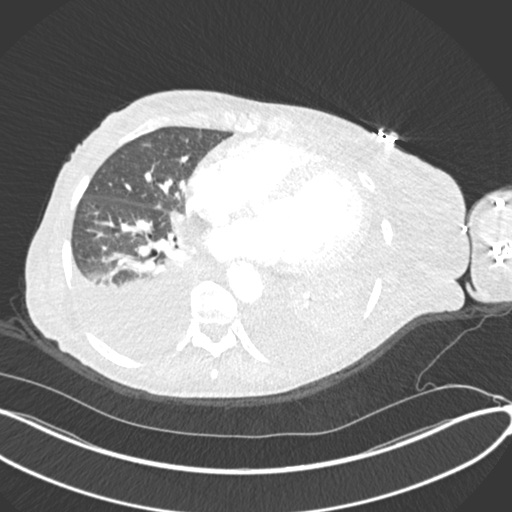
[im 122/274  mediastinal]
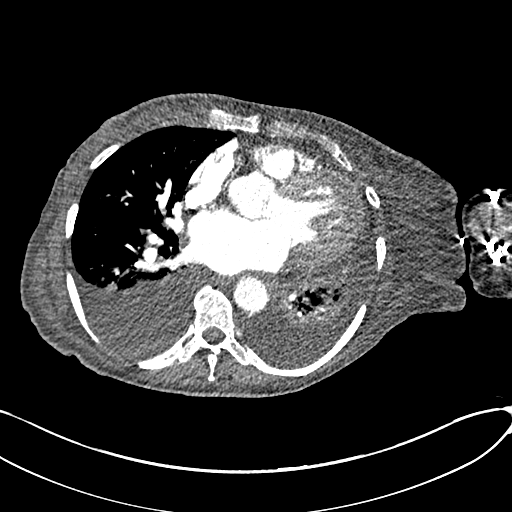
[im 137/274  lung]
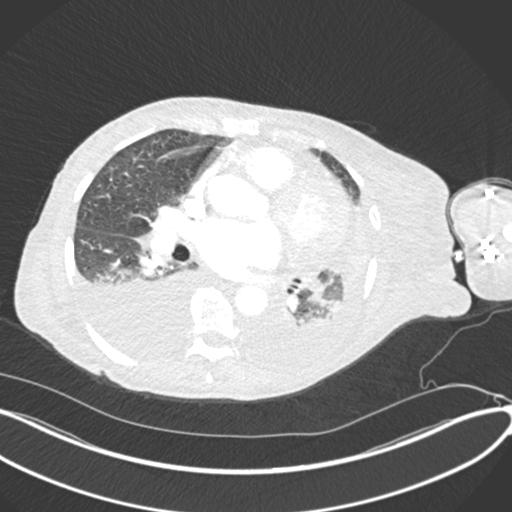
[im 152/274  mediastinal]
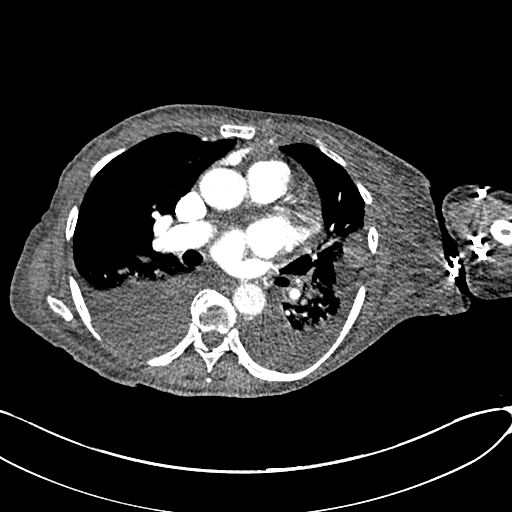
[im 167/274  lung]
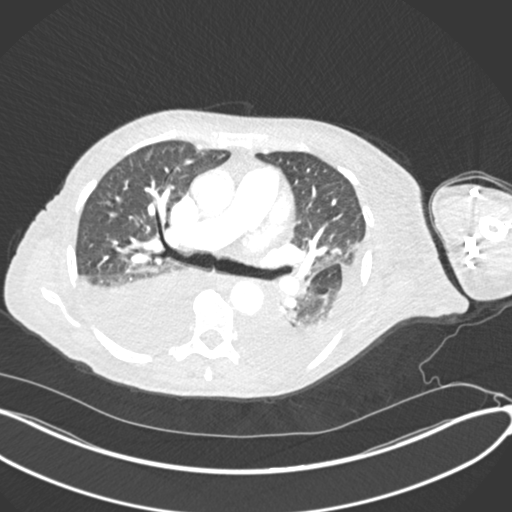
[im 183/274  mediastinal]
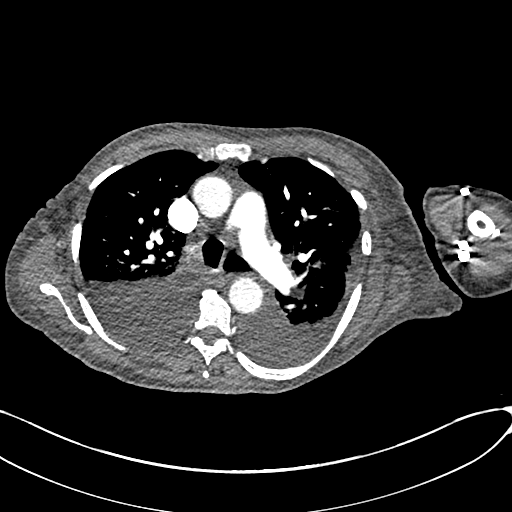
[im 198/274  lung]
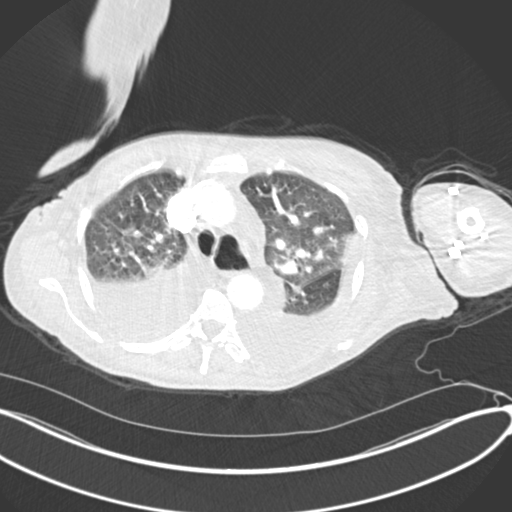
[im 213/274  mediastinal]
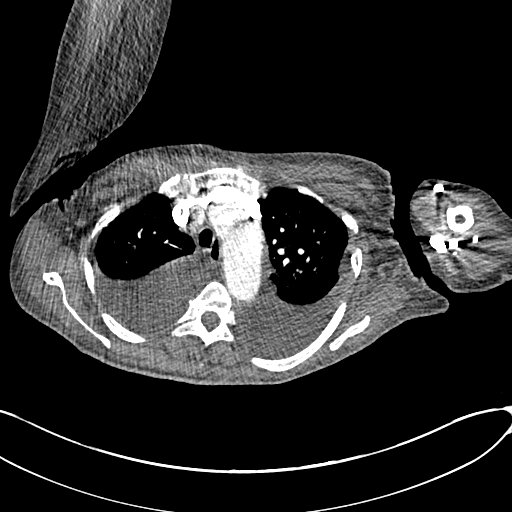
[im 228/274  lung]
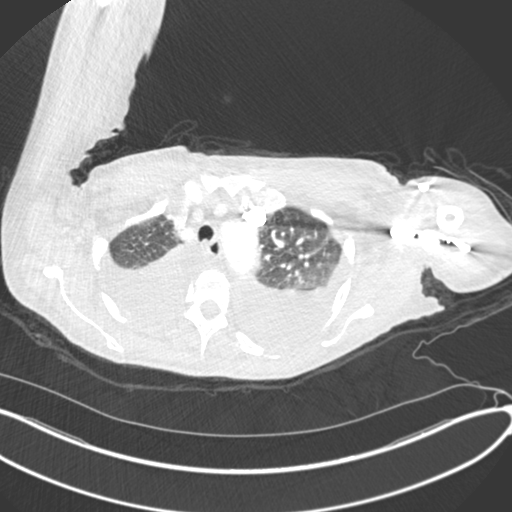
[im 243/274  mediastinal]
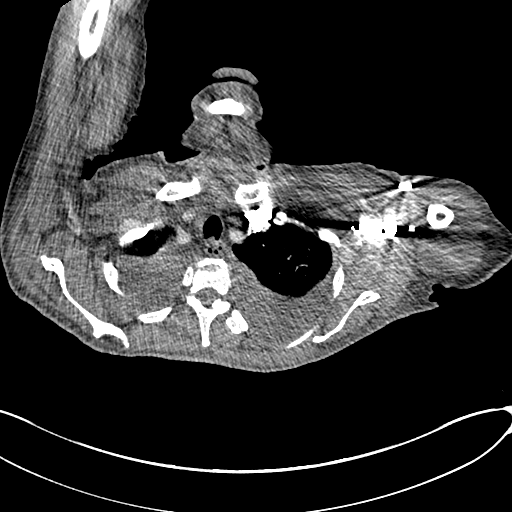
[im 258/274  lung]
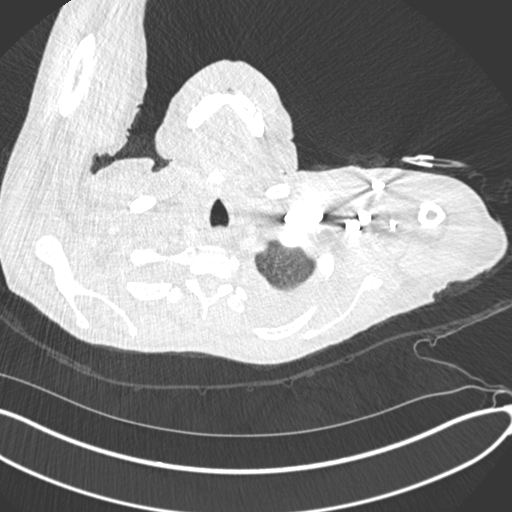

[Series 7: coronal mpr · coronal · 0.62mm/px · 1 of 145 slices shown]
[im 73/145  mediastinal]
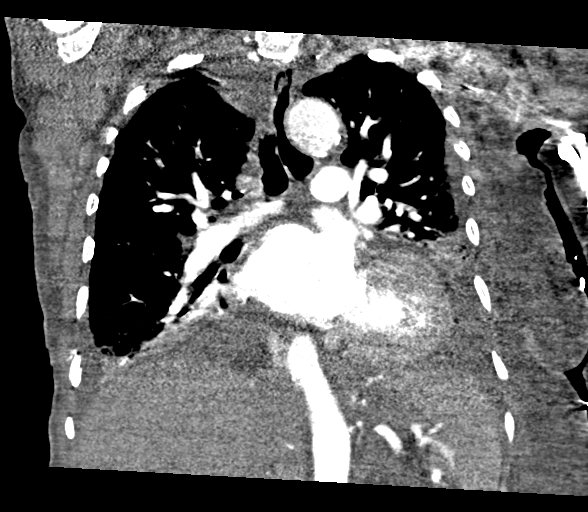

[18 of 36 positions shown; findings below may reference images not displayed]

FINDINGS: Cardiovascular: Satisfactory opacification of the pulmonary arteries
to the segmental level. No evidence of pulmonary embolism. Normal
heart size. No pericardial effusion. Aorta atherosclerotic plaque.

Mediastinum/Nodes: No enlarged mediastinal, hilar, or axillary lymph
nodes. Thyroid gland, trachea, and esophagus demonstrate no
significant findings.

Lungs/Pleura: Interlobular septal wall thickening. Left upper lobe
atelectasis. Passive atelectasis bilateral lower lobes. No focal
consolidation. Few scattered pulmonary micronodules. No pulmonary
mass. Trace to small volume left and small to moderate volume right
pleural effusions. No pneumothorax.

Upper Abdomen: No acute abnormality.

Musculoskeletal:

No abdominal wall hernia or abnormality.

No suspicious lytic or blastic osseous lesions. No acute displaced
fracture. Multilevel degenerative changes of the spine.
Levoscoliosis of the thoracolumbar spine.

Review of the MIP images confirms the above findings.
IMPRESSION: 1. No pulmonary embolus.
2. Pulmonary edema with trace to small volume left and small to
moderate volume right pleural effusions.
3.  Aortic Atherosclerosis ([id number]).

## 2023-05-29 ENCOUNTER — Ambulatory Visit: Payer: Self-pay

## 2023-05-29 DIAGNOSIS — M1990 Unspecified osteoarthritis, unspecified site: Secondary | ICD-10-CM | POA: Diagnosis not present

## 2023-05-29 DIAGNOSIS — Z9181 History of falling: Secondary | ICD-10-CM | POA: Diagnosis not present

## 2023-05-29 DIAGNOSIS — Z6830 Body mass index (BMI) 30.0-30.9, adult: Secondary | ICD-10-CM | POA: Diagnosis not present

## 2023-05-29 DIAGNOSIS — E669 Obesity, unspecified: Secondary | ICD-10-CM | POA: Diagnosis not present

## 2023-05-29 DIAGNOSIS — Z993 Dependence on wheelchair: Secondary | ICD-10-CM | POA: Diagnosis not present

## 2023-05-29 DIAGNOSIS — I890 Lymphedema, not elsewhere classified: Secondary | ICD-10-CM | POA: Diagnosis not present

## 2023-05-29 DIAGNOSIS — Z556 Problems related to health literacy: Secondary | ICD-10-CM | POA: Diagnosis not present

## 2023-05-29 DIAGNOSIS — Z604 Social exclusion and rejection: Secondary | ICD-10-CM | POA: Diagnosis not present

## 2023-05-29 DIAGNOSIS — I132 Hypertensive heart and chronic kidney disease with heart failure and with stage 5 chronic kidney disease, or end stage renal disease: Secondary | ICD-10-CM | POA: Diagnosis not present

## 2023-05-29 DIAGNOSIS — I110 Hypertensive heart disease with heart failure: Secondary | ICD-10-CM | POA: Diagnosis not present

## 2023-05-29 DIAGNOSIS — Z635 Disruption of family by separation and divorce: Secondary | ICD-10-CM | POA: Diagnosis not present

## 2023-05-29 DIAGNOSIS — E785 Hyperlipidemia, unspecified: Secondary | ICD-10-CM | POA: Diagnosis not present

## 2023-05-29 DIAGNOSIS — Z87440 Personal history of urinary (tract) infections: Secondary | ICD-10-CM | POA: Diagnosis not present

## 2023-05-29 NOTE — Patient Instructions (Signed)
Visit Information  Thank you for taking time to visit with me today. Please don't hesitate to contact me if I can be of assistance to you.   Following are the goals we discussed today:   Goals Addressed             This Visit's Progress    COMPLETED: I would like to keep my mom out of the hospital       Care Coordination Interventions: Assessed the POA daughter [name] understanding of ESRD    Evaluation of current treatment plan related to end stage renal disease and self management and patient's adherence to plan as established by provider  Determined patient continues to attend hemodialysis at [facility] [place] on TTS 11:50 am      Daughter reports her mother is feeling better overall since starting dialysis, she continues to work with PT and has increased her physical activity overall  Discussed patient is adhering to her renal diet and her fluid restrictions, her treatments are going well  Instructed daughter to alert patient's doctor of new symptoms or concerns promptly          Our next appointment is by telephone on 08/01/23 at 2:30 PM  Please call the care guide team at [phone] if you need to cancel or reschedule your appointment.   If you are experiencing a Mental Health or Behavioral Health Crisis or need someone to talk to, please call 1-800-273-TALK (toll free, 24 hour hotline)  Patient verbalizes understanding of instructions and care plan provided today and agrees to view in MyChart. Active MyChart status and patient understanding of how to access instructions and care plan via MyChart confirmed with patient.     Delsa Sale, RN, BSN, CCM Care Management Coordinator [facility] Care Management  Direct Phone: [phone]

## 2023-05-29 NOTE — Patient Outreach (Signed)
  Care Coordination   Follow Up Visit Note   05/29/2023 Name: Kara Mullins MRN: [id number] DOB: 1951/03/05  Kara Mullins is a 72 y.o. year old female who sees [name], [name], NP for primary care. I spoke with daughter Kara Mullins by phone today.  What matters to the patients health and wellness today?  Patient would like to continue getting stronger and feeling healthier.     Goals Addressed             This Visit's Progress    COMPLETED: I would like to keep my mom out of the hospital       Care Coordination Interventions: Assessed the POA daughter [name] understanding of ESRD    Evaluation of current treatment plan related to end stage renal disease and self management and patient's adherence to plan as established by provider  Determined patient continues to attend hemodialysis at [facility] [place] on TTS 11:50 am      Daughter reports her mother is feeling better overall since starting dialysis, she continues to work with PT and has increased her physical activity overall  Discussed patient is adhering to her renal diet and her fluid restrictions, her treatments are going well  Instructed daughter to alert patient's doctor of new symptoms or concerns promptly     Interventions Today    Flowsheet Row Most Recent Value  Chronic Disease   Chronic disease during today's visit Chronic Kidney Disease/End Stage Renal Disease (ESRD)  General Interventions   General Interventions Discussed/Reviewed General Interventions Discussed, General Interventions Reviewed, Doctor Visits, Durable Medical Equipment (DME)  Doctor Visits Discussed/Reviewed Doctor Visits Discussed, Doctor Visits Reviewed, Specialist  Durable Medical Equipment (DME) Other  [stander]  Exercise Interventions   Exercise Discussed/Reviewed Physical Activity  Physical Activity Discussed/Reviewed Physical Activity Discussed, Home Exercise Program (HEP), Types of exercise  Education Interventions    Education Provided Provided Education  Provided Verbal Education On Nutrition, When to see the doctor  Nutrition Interventions   Nutrition Discussed/Reviewed Nutrition Discussed, Fluid intake  Safety Interventions   Safety Discussed/Reviewed Safety Discussed, Safety Reviewed, Fall Risk, Home Safety  Home Safety Assistive Devices          SDOH assessments and interventions completed:  No     Care Coordination Interventions:  Yes, provided   Follow up plan: Follow up call scheduled for 08/01/23 @2 :30 PM    Encounter Outcome:  Pt. Visit Completed

## 2023-06-03 NOTE — Telephone Encounter (Signed)
[name] called again today and made the same statement on voicemail. I called her back and left voicemail.

## 2023-06-04 DIAGNOSIS — Z6830 Body mass index (BMI) 30.0-30.9, adult: Secondary | ICD-10-CM | POA: Diagnosis not present

## 2023-06-04 DIAGNOSIS — Z556 Problems related to health literacy: Secondary | ICD-10-CM | POA: Diagnosis not present

## 2023-06-04 DIAGNOSIS — Z87440 Personal history of urinary (tract) infections: Secondary | ICD-10-CM | POA: Diagnosis not present

## 2023-06-04 DIAGNOSIS — I110 Hypertensive heart disease with heart failure: Secondary | ICD-10-CM | POA: Diagnosis not present

## 2023-06-04 DIAGNOSIS — E785 Hyperlipidemia, unspecified: Secondary | ICD-10-CM | POA: Diagnosis not present

## 2023-06-04 DIAGNOSIS — I890 Lymphedema, not elsewhere classified: Secondary | ICD-10-CM | POA: Diagnosis not present

## 2023-06-04 DIAGNOSIS — M1990 Unspecified osteoarthritis, unspecified site: Secondary | ICD-10-CM | POA: Diagnosis not present

## 2023-06-04 DIAGNOSIS — I132 Hypertensive heart and chronic kidney disease with heart failure and with stage 5 chronic kidney disease, or end stage renal disease: Secondary | ICD-10-CM | POA: Diagnosis not present

## 2023-06-04 DIAGNOSIS — Z604 Social exclusion and rejection: Secondary | ICD-10-CM | POA: Diagnosis not present

## 2023-06-04 DIAGNOSIS — Z993 Dependence on wheelchair: Secondary | ICD-10-CM | POA: Diagnosis not present

## 2023-06-04 DIAGNOSIS — E669 Obesity, unspecified: Secondary | ICD-10-CM | POA: Diagnosis not present

## 2023-06-04 DIAGNOSIS — Z9181 History of falling: Secondary | ICD-10-CM | POA: Diagnosis not present

## 2023-06-04 DIAGNOSIS — Z635 Disruption of family by separation and divorce: Secondary | ICD-10-CM | POA: Diagnosis not present

## 2023-06-11 ENCOUNTER — Emergency Department (HOSPITAL_COMMUNITY): Payer: Medicare (Managed Care)

## 2023-06-11 ENCOUNTER — Inpatient Hospital Stay (HOSPITAL_COMMUNITY)
Admission: EM | Admit: 2023-06-11 | Discharge: 2023-06-13 | DRG: 291 | Disposition: A | Discharge status: Home Health | Payer: Medicare (Managed Care) | Attending: Internal Medicine | Admitting: Internal Medicine

## 2023-06-11 ENCOUNTER — Other Ambulatory Visit: Payer: Self-pay

## 2023-06-11 ENCOUNTER — Encounter (HOSPITAL_COMMUNITY): Payer: Self-pay | Admitting: Emergency Medicine

## 2023-06-11 DIAGNOSIS — Z9841 Cataract extraction status, right eye: Secondary | ICD-10-CM

## 2023-06-11 DIAGNOSIS — I5043 Acute on chronic combined systolic (congestive) and diastolic (congestive) heart failure: Secondary | ICD-10-CM | POA: Diagnosis present

## 2023-06-11 DIAGNOSIS — Z9842 Cataract extraction status, left eye: Secondary | ICD-10-CM | POA: Diagnosis not present

## 2023-06-11 DIAGNOSIS — D649 Anemia, unspecified: Secondary | ICD-10-CM | POA: Diagnosis not present

## 2023-06-11 DIAGNOSIS — D72819 Decreased white blood cell count, unspecified: Secondary | ICD-10-CM | POA: Diagnosis present

## 2023-06-11 DIAGNOSIS — J90 Pleural effusion, not elsewhere classified: Secondary | ICD-10-CM | POA: Diagnosis not present

## 2023-06-11 DIAGNOSIS — I160 Hypertensive urgency: Secondary | ICD-10-CM | POA: Diagnosis not present

## 2023-06-11 DIAGNOSIS — J9811 Atelectasis: Secondary | ICD-10-CM | POA: Diagnosis not present

## 2023-06-11 DIAGNOSIS — N2581 Secondary hyperparathyroidism of renal origin: Secondary | ICD-10-CM | POA: Diagnosis not present

## 2023-06-11 DIAGNOSIS — I509 Heart failure, unspecified: Principal | ICD-10-CM

## 2023-06-11 DIAGNOSIS — N250 Renal osteodystrophy: Secondary | ICD-10-CM | POA: Diagnosis not present

## 2023-06-11 DIAGNOSIS — R079 Chest pain, unspecified: Secondary | ICD-10-CM | POA: Diagnosis not present

## 2023-06-11 DIAGNOSIS — I10 Essential (primary) hypertension: Secondary | ICD-10-CM | POA: Diagnosis not present

## 2023-06-11 DIAGNOSIS — Z888 Allergy status to other drugs, medicaments and biological substances status: Secondary | ICD-10-CM | POA: Diagnosis not present

## 2023-06-11 DIAGNOSIS — I5023 Acute on chronic systolic (congestive) heart failure: Secondary | ICD-10-CM | POA: Diagnosis not present

## 2023-06-11 DIAGNOSIS — R0602 Shortness of breath: Secondary | ICD-10-CM | POA: Diagnosis not present

## 2023-06-11 DIAGNOSIS — N186 End stage renal disease: Secondary | ICD-10-CM | POA: Diagnosis not present

## 2023-06-11 DIAGNOSIS — E8779 Other fluid overload: Secondary | ICD-10-CM | POA: Diagnosis not present

## 2023-06-11 DIAGNOSIS — Z808 Family history of malignant neoplasm of other organs or systems: Secondary | ICD-10-CM | POA: Diagnosis not present

## 2023-06-11 DIAGNOSIS — Z79899 Other long term (current) drug therapy: Secondary | ICD-10-CM

## 2023-06-11 DIAGNOSIS — R5381 Other malaise: Secondary | ICD-10-CM | POA: Diagnosis present

## 2023-06-11 DIAGNOSIS — I444 Left anterior fascicular block: Secondary | ICD-10-CM | POA: Diagnosis not present

## 2023-06-11 DIAGNOSIS — D631 Anemia in chronic kidney disease: Secondary | ICD-10-CM | POA: Diagnosis not present

## 2023-06-11 DIAGNOSIS — Z992 Dependence on renal dialysis: Secondary | ICD-10-CM

## 2023-06-11 DIAGNOSIS — F411 Generalized anxiety disorder: Secondary | ICD-10-CM | POA: Diagnosis not present

## 2023-06-11 DIAGNOSIS — Z1152 Encounter for screening for COVID-19: Secondary | ICD-10-CM | POA: Diagnosis not present

## 2023-06-11 DIAGNOSIS — R918 Other nonspecific abnormal finding of lung field: Secondary | ICD-10-CM | POA: Diagnosis not present

## 2023-06-11 DIAGNOSIS — R0600 Dyspnea, unspecified: Secondary | ICD-10-CM | POA: Diagnosis not present

## 2023-06-11 DIAGNOSIS — I132 Hypertensive heart and chronic kidney disease with heart failure and with stage 5 chronic kidney disease, or end stage renal disease: Principal | ICD-10-CM | POA: Diagnosis present

## 2023-06-11 DIAGNOSIS — D693 Immune thrombocytopenic purpura: Secondary | ICD-10-CM | POA: Diagnosis present

## 2023-06-11 DIAGNOSIS — R0989 Other specified symptoms and signs involving the circulatory and respiratory systems: Secondary | ICD-10-CM | POA: Diagnosis not present

## 2023-06-11 DIAGNOSIS — E785 Hyperlipidemia, unspecified: Secondary | ICD-10-CM | POA: Diagnosis not present

## 2023-06-11 DIAGNOSIS — I5021 Acute systolic (congestive) heart failure: Secondary | ICD-10-CM | POA: Diagnosis not present

## 2023-06-11 LAB — TROPONIN I (HIGH SENSITIVITY)
Troponin I (High Sensitivity): 5 ng/L (ref <18)
Troponin I (High Sensitivity): 4 ng/L (ref <18)

## 2023-06-11 LAB — BASIC METABOLIC PANEL
Anion gap: 10 (ref 5–15)
BUN: 28 mg/dL — ABNORMAL HIGH (ref 8–23)
CO2: 27 mmol/L (ref 22–32)
Calcium: 8.7 mg/dL — ABNORMAL LOW (ref 8.9–10.3)
Chloride: 102 mmol/L (ref 98–111)
Creatinine, Ser: 2.86 mg/dL — ABNORMAL HIGH (ref 0.44–1.00)
GFR, Estimated: 17 mL/min — ABNORMAL LOW (ref >60)
Glucose, Bld: 70 mg/dL (ref 70–99)
Potassium: 3.8 mmol/L (ref 3.5–5.1)
Sodium: 139 mmol/L (ref 135–145)

## 2023-06-11 LAB — CBC
HCT: 38.2 % (ref 36.0–46.0)
Hemoglobin: 11.4 g/dL — ABNORMAL LOW (ref 12.0–15.0)
MCH: 26.8 pg (ref 26.0–34.0)
MCHC: 29.8 g/dL — ABNORMAL LOW (ref 30.0–36.0)
MCV: 89.7 fL (ref 80.0–100.0)
Platelets: 111 10*3/uL — ABNORMAL LOW (ref 150–400)
RBC: 4.26 MIL/uL (ref 3.87–5.11)
RDW: 15.1 % (ref 11.5–15.5)
WBC: 2.7 10*3/uL — ABNORMAL LOW (ref 4.0–10.5)
nRBC: 0 % (ref 0.0–0.2)

## 2023-06-11 LAB — RESP PANEL BY RT-PCR (RSV, FLU A&B, COVID)  RVPGX2
Influenza A by PCR: NEGATIVE
Influenza B by PCR: NEGATIVE
Resp Syncytial Virus by PCR: NEGATIVE
SARS Coronavirus 2 by RT PCR: NEGATIVE

## 2023-06-11 LAB — BRAIN NATRIURETIC PEPTIDE: B Natriuretic Peptide: 3228.3 pg/mL — ABNORMAL HIGH (ref 0.0–100.0)

## 2023-06-11 MED ORDER — SENNOSIDES-DOCUSATE SODIUM 8.6-50 MG PO TABS
1.0000 | ORAL_TABLET | Freq: Every evening | ORAL | Status: DC | PRN
  Filled 2023-06-11: qty 1

## 2023-06-11 MED ORDER — HYDRALAZINE HCL 20 MG/ML IJ SOLN: 10.0000 mg | Freq: Three times a day (TID) | INTRAMUSCULAR | Status: DC | PRN

## 2023-06-11 MED ORDER — FUROSEMIDE 10 MG/ML IJ SOLN
80.0000 mg | Freq: Once | INTRAMUSCULAR | Status: AC
  Administered 2023-06-12: 80 mg via INTRAVENOUS
  Filled 2023-06-11: qty 8

## 2023-06-11 MED ORDER — HYDRALAZINE HCL 20 MG/ML IJ SOLN
10.0000 mg | Freq: Once | INTRAMUSCULAR | Status: AC
  Administered 2023-06-12: 10 mg via INTRAVENOUS
  Filled 2023-06-11: qty 1

## 2023-06-11 MED ORDER — HEPARIN SODIUM (PORCINE) 5000 UNIT/ML IJ SOLN
5000.0000 [IU] | Freq: Three times a day (TID) | INTRAMUSCULAR | Status: DC
  Administered 2023-06-12 – 2023-06-13 (×4): 5000 [IU] via SUBCUTANEOUS
  Filled 2023-06-11 (×5): qty 1

## 2023-06-11 MED ORDER — ISOSORBIDE MONONITRATE ER 30 MG PO TB24
30.0000 mg | ORAL_TABLET | Freq: Every day | ORAL | Status: DC
  Administered 2023-06-12 – 2023-06-13 (×2): 30 mg via ORAL
  Filled 2023-06-11 (×2): qty 1

## 2023-06-11 MED ORDER — FUROSEMIDE 10 MG/ML IJ SOLN: 80.0000 mg | Freq: Two times a day (BID) | INTRAMUSCULAR | Status: DC

## 2023-06-11 MED ORDER — ENSURE ENLIVE PO LIQD
237.0000 mL | Freq: Two times a day (BID) | ORAL | Status: DC
  Administered 2023-06-12 – 2023-06-13 (×2): 237 mL via ORAL
  Filled 2023-06-11: qty 237

## 2023-06-11 MED ORDER — CARVEDILOL 3.125 MG PO TABS
3.1250 mg | ORAL_TABLET | Freq: Two times a day (BID) | ORAL | Status: DC
  Administered 2023-06-12 – 2023-06-13 (×2): 3.125 mg via ORAL
  Filled 2023-06-11 (×2): qty 1

## 2023-06-11 MED ORDER — SEVELAMER CARBONATE 800 MG PO TABS
800.0000 mg | ORAL_TABLET | Freq: Every day | ORAL | Status: DC
  Administered 2023-06-12: 800 mg via ORAL
  Filled 2023-06-11: qty 1

## 2023-06-11 MED ORDER — ACETAMINOPHEN 650 MG RE SUPP: 650.0000 mg | Freq: Four times a day (QID) | RECTAL | Status: DC | PRN

## 2023-06-11 MED ORDER — FUROSEMIDE 10 MG/ML IJ SOLN
40.0000 mg | Freq: Two times a day (BID) | INTRAMUSCULAR | Status: DC
  Administered 2023-06-12: 40 mg via INTRAVENOUS
  Filled 2023-06-11 (×2): qty 4

## 2023-06-11 MED ORDER — HYDRALAZINE HCL 25 MG PO TABS
25.0000 mg | ORAL_TABLET | Freq: Three times a day (TID) | ORAL | Status: DC
  Filled 2023-06-11: qty 1

## 2023-06-11 MED ORDER — ACETAMINOPHEN 325 MG PO TABS: 650.0000 mg | ORAL_TABLET | Freq: Four times a day (QID) | ORAL | Status: DC | PRN

## 2023-06-11 MED ORDER — FOLIC ACID 1 MG PO TABS
1.0000 mg | ORAL_TABLET | Freq: Every day | ORAL | Status: DC
  Administered 2023-06-12 – 2023-06-13 (×2): 1 mg via ORAL
  Filled 2023-06-11 (×2): qty 1

## 2023-06-11 NOTE — ED Provider Notes (Signed)
EMERGENCY DEPARTMENT AT [facility] Provider Note   CSN: [id number] Arrival date & time: 06/11/23  2011     History  Chief Complaint  Patient presents with   Hypertension    Kara Mullins is a 72 y.o. female.  Patient with history of end-stage renal disease on dialysis Tuesday/Thursday/Saturday, congestive heart failure, hypertension --presents to the emergency department for evaluation of high blood pressure and shortness of breath.  Patient had dialysis yesterday, full duration.  She reports increasing shortness of breath today with difficulty lying flat.  She has baseline lower extremity swelling.  She continues to take Lasix and does produce urine.  She missed 1 dose, 2 days ago, due to trouble getting her next prescription.  She was able to fill this and took a dose this morning.  No fevers or cough reported.  No chest pain.  Patient just feels generally unwell.  At baseline she stands but does not walk.  She has a wheelchair and a device to assist with transfer.  Typically her systolic blood pressure once 130-150.  Has been much higher today.  She complains of mild headache but no neuro symptoms. Patient denies signs of stroke including: facial droop, slurred speech, aphasia, weakness/numbness in extremities, imbalance/trouble walking.        Home Medications Prior to Admission medications   Medication Sig Start Date End Date Taking? Authorizing Provider  carvedilol (COREG) 6.25 MG tablet Take 0.5 tablets (3.125 mg total) by mouth 2 (two) times daily with a meal. 05/09/23   Reather Littler D, NP  Coenzyme Q10 (CO Q 10 PO) Take 1 capsule by mouth daily.    [provider]  feeding supplement (ENSURE ENLIVE / ENSURE PLUS) LIQD Take 237 mLs by mouth 2 (two) times daily between meals. 06/17/22   Barnetta Chapel, MD  folic acid (FOLVITE) 1 MG tablet Take 1 tablet by mouth daily.    [provider]  furosemide (LASIX) 80 MG tablet Take 1 tablet (80  mg total) by mouth every Monday, Wednesday, Friday and Sunday. 04/22/23   [name], [name], NP  hydrALAZINE (APRESOLINE) 25 MG tablet Take 1 tablet (25 mg total) by mouth every 8 (eight) hours. 04/01/23 06/30/23  Almon Hercules, MD  isosorbide mononitrate (IMDUR) 30 MG 24 hr tablet Take 1 tablet (30 mg total) by mouth daily. 04/22/23   [name], [name], NP  Menthol-Methyl Salicylate (MUSCLE RUB) 10-15 % CREA Apply 1 Application topically daily as needed for muscle pain.    [provider]  multivitamin (RENA-VIT) TABS tablet Take 1 tablet by mouth daily.    [provider]  sevelamer carbonate (RENVELA) 800 MG tablet Take 800 mg by mouth daily with supper. 07/10/22   [provider]      Allergies    Crestor [rosuvastatin] and Nitrofuran derivatives    Review of Systems   Review of Systems  Physical Exam Updated Vital Signs BP (!) 189/84   Pulse 72   Temp 97.8 F (36.6 C)   Resp 17   SpO2 100%  Physical Exam Vitals and nursing note reviewed.  Constitutional:      Appearance: She is well-developed. She is not diaphoretic.  HENT:     Head: Normocephalic and atraumatic.     Nose: Nose normal.     Mouth/Throat:     Mouth: Mucous membranes are moist. Mucous membranes are not dry.  Eyes:     Conjunctiva/sclera: Conjunctivae normal.  Neck:  Vascular: Normal carotid pulses. No JVD.     Trachea: Trachea normal. No tracheal deviation.  Cardiovascular:     Rate and Rhythm: Normal rate and regular rhythm.     Pulses: No decreased pulses.          Radial pulses are 2+ on the right side and 2+ on the left side.     Heart sounds: Normal heart sounds, S1 normal and S2 normal. No murmur heard. Pulmonary:     Effort: Pulmonary effort is normal. No respiratory distress.     Breath sounds: Examination of the right-middle field reveals rales. Examination of the left-middle field reveals rales. Examination of the right-lower field reveals rales. Examination of the  left-lower field reveals rales. Rales present. No wheezing.  Chest:     Chest wall: No tenderness.  Abdominal:     General: Bowel sounds are normal.     Palpations: Abdomen is soft.     Tenderness: There is no abdominal tenderness. There is no guarding or rebound.  Musculoskeletal:        General: Normal range of motion.     Cervical back: Normal range of motion and neck supple. No muscular tenderness.     Right lower leg: Edema present.     Left lower leg: Edema present.  Skin:    General: Skin is warm and dry.     Coloration: Skin is not pale.  Neurological:     Mental Status: She is alert.     ED Results / Procedures / Treatments   Labs (all labs ordered are listed, but only abnormal results are displayed) Labs Reviewed  BASIC METABOLIC PANEL - Abnormal; Notable for the following components:      Result Value   BUN 28 (*)    Creatinine, Ser 2.86 (*)    Calcium 8.7 (*)    GFR, Estimated 17 (*)    All other components within normal limits  CBC - Abnormal; Notable for the following components:   WBC 2.7 (*)    Hemoglobin 11.4 (*)    MCHC 29.8 (*)    Platelets 111 (*)    All other components within normal limits  RESP PANEL BY RT-PCR (RSV, FLU A&B, COVID)  [id number]  BRAIN NATRIURETIC PEPTIDE  TROPONIN I (HIGH SENSITIVITY)    ED ECG REPORT   Date: 06/11/2023  Rate: 74  Rhythm: normal sinus rhythm  QRS Axis: normal  Intervals: normal  ST/T Wave abnormalities: nonspecific T wave changes  Conduction Disutrbances:none  Narrative Interpretation:   Old EKG Reviewed: unchanged  I have personally reviewed the EKG tracing and agree with the computerized printout as noted.   Radiology DG Chest 2 View  Result Date: 06/11/2023 CLINICAL DATA:  Chest pain and shortness of breath. EXAM: CHEST - 2 VIEW COMPARISON:  03/30/2023. FINDINGS: The heart is enlarged and the mediastinal contour is within normal limits. There is atherosclerotic calcification of the aorta. A right  internal jugular central venous catheter appear stable. The pulmonary vasculature is distended. There are moderate bilateral pleural effusions with airspace disease at the lung bases. No pneumothorax. No acute osseous abnormality. IMPRESSION: 1. Cardiomegaly with pulmonary vascular congestion. 2. Moderate bilateral pleural effusions with edema, atelectasis, or infiltrate. Electronically Signed   By: Thornell Sartorius M.D.   On: 06/11/2023 21:15    Procedures Procedures    Medications Ordered in ED Medications - No data to display  ED Course/ Medical Decision Making/ A&P    Patient seen and examined. History  obtained directly from patient and daughter at bedside. I reviewed hospital notes from admission in June.   Labs/EKG: Ordered CBC, BMP, troponin.  I have added BNP and COVID swab.  Imaging: Personally reviewed chest x-ray, agree effusion/edema.  Medications/Fluids: None ordered  Most recent vital signs reviewed and are as follows: BP (!) 189/84   Pulse 72   Temp 97.8 F (36.6 C)   Resp 17   SpO2 100%   Initial impression: Shortness of breath  10:52 PM Reassessment performed. Patient appears stable.  She has not been hypoxic but feels better on supplemental O2.  Labs personally reviewed and interpreted including: CBC shows white blood cell count slightly low at 2.7, hemoglobin at 11.4, platelets 111; BMP creatinine near baseline at 2.87 with BUN of 28, electrolytes and glucose normal; BNP much elevated than baseline today at 3200, she was in the 800-900 range in June; respiratory panel negative; troponin normal.  Reviewed pertinent lab work and imaging with patient at bedside. Questions answered.   Most current vital signs reviewed and are as follows: BP (!) 183/97   Pulse 73   Temp 97.8 F (36.6 C)   Resp (!) 21   SpO2 98%   Plan: IV Lasix, IV hydralazine for elevated blood pressure  Patient has been discussed with and seen by Dr. Jeraldine Loots.  Given clinical fluid  overload, worsening shortness of breath with symptoms concerning for congestive heart failure, plan for admission.  Patient and family in agreement.  Patient is assigned.  11:14 PM Spoke with Dr. Janalyn Shy, [facility], who will see for admission.                                  Medical Decision Making Amount and/or Complexity of Data Reviewed Labs: ordered. Radiology: ordered.   Patient with shortness of breath, pulmonary edema, elevated BNP.  She has not missed dialysis.  Her blood pressures have been elevated here.  She missed 1 dose of Lasix 2 days ago, but is otherwise compliant.  She will be admitted for diuresis, blood pressure control.  Low concern for acute ACS, PE.        Final Clinical Impression(s) / ED Diagnoses Final diagnoses:  Acute on chronic congestive heart failure, unspecified heart failure type (HCC)  ESRD (end stage renal disease) on dialysis [facility])  Hypertensive urgency    Rx / DC Orders ED Discharge Orders     None         Desmond Dike 06/11/23 2315    Gerhard Munch, MD 06/11/23 2324

## 2023-06-11 NOTE — ED Notes (Addendum)
Provider at bedside while this writer was collecting blood specimen and Covid swab. Provider turned off PT's O2 to monitor SpO2 on room air.   PT is maintaining 98%-99% on room air. Initially PT was anxious to not have supplemental O2 and was holding her breath to "save my oxygen."  This writer coached PT through not holding her breath and encouraged her to breathe "as she would on a normal day at home".  PT is now conversing with family and singing gospel hymns, SpO2 is maintaining at 98%-99% on room air.

## 2023-06-11 NOTE — H&P (Addendum)
History and Physical    Kara Mullins KGM:[id number] DOB: 05-Jul-1951 DOA: 06/11/2023  PCP: Caesar Bookman, NP   Patient coming from: Home   Chief Complaint:  Chief Complaint  Patient presents with   Hypertension    HPI:  Kara Mullins is a 72 y.o. female with medical history significant of essential hypertension, ESRD TTS, hyperlipidemia, combined systolic and diastolic heart failure, anemia of chronic disease and generalized anxiety disorder presented to emergency department with complaining of elevated blood pressure and shortness of breath.  Patient reported that she underwent dialysis yesterday and completed full session.  She is having shortness of breath today and difficulty lying flat.  She was also has bilateral lower extremity swelling.  She continues to take Lasix as she still produces urine.  She just means recently 1 dose of Lasix 2 days ago as she had trouble to getting her prescription.  Patient denies any chest pain.  At the baseline she uses wheelchair and has a device to assist with transfer.  During my evaluation patient is complaining about headache.  She denies any chest pain, shortness of breath, palpitation, blurry vision, abdominal pain, nausea and vomiting.   ED Course:  At presentation to ED patient blood pressure found elevated 214/102 which has been improved to 183/97.  Heart rate 76, respiratory rate 21 and O2 sat 98% room air. BMP showed sodium 139, potassium 3.8, chloride 102, bicarb 27, calcium 8.7, anion gap 10. CBC showed low WBC 2.7, stable H&H 11.4 and 38.2 and and low platelet 111. High sensitive troponin 5 WNL. Elevated BNP 3228 which is elevated from her baseline.  Chest x-ray showed cardiomegaly with pulmonary vascular congestion.  Moderate bilateral pulmonary effusion and edema, atelectasis and infiltrate.  In the ED patient has been given Lasix 80 mg IV once and hydralazine 10 mg IV once. Hospital has been consulted for the management of volume  overload.  Review of Systems:  Review of Systems  Constitutional:  Negative for chills, fever, malaise/fatigue and weight loss.  Eyes:  Negative for blurred vision.  Respiratory:  Negative for cough, hemoptysis and sputum production.   Cardiovascular:  Negative for chest pain and palpitations.  Gastrointestinal:  Negative for abdominal pain, heartburn, nausea and vomiting.  Genitourinary:  Negative for dysuria, frequency and urgency.  Musculoskeletal:  Negative for myalgias.  Skin:  Negative for rash.  Neurological:  Negative for dizziness and headaches.  Psychiatric/Behavioral:  The patient is not nervous/anxious.     Past Medical History:  Diagnosis Date   Chronic combined systolic and diastolic congestive heart failure (HCC) 11/09/2021   Generalized anxiety disorder    Hyperlipidemia 11/09/2021   Hypertension    Stage 3b chronic kidney disease (CKD) (HCC) 11/09/2021    Past Surgical History:  Procedure Laterality Date   AV FISTULA PLACEMENT Left 03/27/2023   Procedure: LEFT ARM BRACHIOCEPHALIC ARTERIOVENOUS (AV) FISTULA CREATION;  Surgeon: [name], MD;  Location: [facility] OR;  Service: Vascular;  Laterality: Left;   CATARACT EXTRACTION, BILATERAL     combined systolic and diastolic congestive heart failure      INSERTION OF DIALYSIS CATHETER N/A 03/27/2023   Procedure: INSERTION OF RIGHT INTERNAL JUGULAR TUNNELED DIALYSIS CATHETER;  Surgeon: [name], MD;  Location: [facility] OR;  Service: Vascular;  Laterality: N/A;     reports that she has never smoked. She has never used smokeless tobacco. She reports that she does not currently use alcohol. She reports that she does not use drugs.  Allergies  Allergen Reactions   Crestor [Rosuvastatin] Shortness Of Breath   Nitrofuran Derivatives Other (See Comments)    Unknown reaction    Family History  Problem Relation Age of Onset   Throat cancer Father     Prior to Admission medications   Medication Sig Start Date End  Date Taking? Authorizing Provider  carvedilol (COREG) 6.25 MG tablet Take 0.5 tablets (3.125 mg total) by mouth 2 (two) times daily with a meal. 05/09/23   Reather Littler D, NP  Coenzyme Q10 (CO Q 10 PO) Take 1 capsule by mouth daily.    [provider]  feeding supplement (ENSURE ENLIVE / ENSURE PLUS) LIQD Take 237 mLs by mouth 2 (two) times daily between meals. 06/17/22   Barnetta Chapel, MD  folic acid (FOLVITE) 1 MG tablet Take 1 tablet by mouth daily.    [provider]  furosemide (LASIX) 80 MG tablet Take 1 tablet (80 mg total) by mouth every Monday, Wednesday, Friday and Sunday. 04/22/23   [name], [name], NP  hydrALAZINE (APRESOLINE) 25 MG tablet Take 1 tablet (25 mg total) by mouth every 8 (eight) hours. 04/01/23 06/30/23  Almon Hercules, MD  isosorbide mononitrate (IMDUR) 30 MG 24 hr tablet Take 1 tablet (30 mg total) by mouth daily. 04/22/23   [name], [name], NP  Menthol-Methyl Salicylate (MUSCLE RUB) 10-15 % CREA Apply 1 Application topically daily as needed for muscle pain.    [provider]  multivitamin (RENA-VIT) TABS tablet Take 1 tablet by mouth daily.    [provider]  sevelamer carbonate (RENVELA) 800 MG tablet Take 800 mg by mouth daily with supper. 07/10/22   [provider]     Physical Exam: Vitals:   06/11/23 2014 06/11/23 2130 06/11/23 2212  BP: (!) 214/102 (!) 189/84 (!) 183/97  Pulse: 76 72 73  Resp: 18 17 (!) 21  Temp: 97.8 F (36.6 C)    SpO2: 94% 100% 98%    Physical Exam HENT:     Head: Normocephalic.     Nose: Nose normal.  Eyes:     Pupils: Pupils are equal, round, and reactive to light.  Cardiovascular:     Rate and Rhythm: Normal rate and regular rhythm.     Pulses: Normal pulses.     Heart sounds: Normal heart sounds.  Pulmonary:     Effort: Pulmonary effort is normal.     Breath sounds: Normal breath sounds.  Abdominal:     General: Bowel sounds are normal. There is no distension.     Tenderness:  There is no abdominal tenderness.  Musculoskeletal:        General: Swelling present.     Cervical back: Neck supple.     Right lower leg: Edema present.     Left lower leg: Edema present.  Skin:    General: Skin is warm.  Neurological:     Mental Status: She is alert and oriented to person, place, and time.  Psychiatric:        Mood and Affect: Mood normal.      Labs on Admission: I have personally reviewed following labs and imaging studies  CBC: Recent Labs  Lab 06/11/23 2038  WBC 2.7*  HGB 11.4*  HCT 38.2  MCV 89.7  PLT 111*   Basic Metabolic Panel: Recent Labs  Lab 06/11/23 2038  NA 139  K 3.8  CL 102  CO2 27  GLUCOSE 70  BUN 28*  CREATININE 2.86*  CALCIUM 8.7*  GFR: CrCl cannot be calculated (Unknown ideal weight.). Liver Function Tests: No results for input(s): "AST", "ALT", "ALKPHOS", "BILITOT", "PROT", "ALBUMIN" in the last 168 hours. No results for input(s): "LIPASE", "AMYLASE" in the last 168 hours. No results for input(s): "AMMONIA" in the last 168 hours. Coagulation Profile: No results for input(s): "INR", "PROTIME" in the last 168 hours. Cardiac Enzymes: Recent Labs  Lab 06/11/23 2038 06/11/23 2206  TROPONINIHS 5 4   BNP (last 3 results) Recent Labs    02/25/23 1226 03/22/23 0050 06/11/23 2145  BNP 1,037.5* 715.1* 3,228.3*   HbA1C: No results for input(s): "HGBA1C" in the last 72 hours. CBG: No results for input(s): "GLUCAP" in the last 168 hours. Lipid Profile: No results for input(s): "CHOL", "HDL", "LDLCALC", "TRIG", "CHOLHDL", "LDLDIRECT" in the last 72 hours. Thyroid Function Tests: No results for input(s): "TSH", "T4TOTAL", "FREET4", "T3FREE", "THYROIDAB" in the last 72 hours. Anemia Panel: No results for input(s): "VITAMINB12", "FOLATE", "FERRITIN", "TIBC", "IRON", "RETICCTPCT" in the last 72 hours. Urine analysis:    Component Value Date/Time   COLORURINE YELLOW (A) 10/04/2022 1151   APPEARANCEUR TURBID (A) 10/04/2022  1151   LABSPEC 1.012 10/04/2022 1151   PHURINE 8.0 10/04/2022 1151   GLUCOSEU NEGATIVE 10/04/2022 1151   HGBUR NEGATIVE 10/04/2022 1151   BILIRUBINUR Negative 12/05/2022 1454   KETONESUR NEGATIVE 10/04/2022 1151   PROTEINUR Positive (A) 12/05/2022 1454   PROTEINUR >=300 (A) 10/04/2022 1151   UROBILINOGEN 0.2 12/05/2022 1454   UROBILINOGEN 0.2 11/19/2019 2004   NITRITE Negative 12/05/2022 1454   NITRITE NEGATIVE 10/04/2022 1151   LEUKOCYTESUR Moderate (2+) (A) 12/05/2022 1454   LEUKOCYTESUR LARGE (A) 10/04/2022 1151    Radiological Exams on Admission: I have personally reviewed images DG Chest 2 View  Result Date: 06/11/2023 CLINICAL DATA:  Chest pain and shortness of breath. EXAM: CHEST - 2 VIEW COMPARISON:  03/30/2023. FINDINGS: The heart is enlarged and the mediastinal contour is within normal limits. There is atherosclerotic calcification of the aorta. A right internal jugular central venous catheter appear stable. The pulmonary vasculature is distended. There are moderate bilateral pleural effusions with airspace disease at the lung bases. No pneumothorax. No acute osseous abnormality. IMPRESSION: 1. Cardiomegaly with pulmonary vascular congestion. 2. Moderate bilateral pleural effusions with edema, atelectasis, or infiltrate. Electronically Signed   By: Thornell Sartorius M.D.   On: 06/11/2023 21:15    EKG: My personal interpretation of EKG shows: Normal sinus rhythm heart rate 74.  Left anterior fascicular block and left  ventricular hypertrophy pattern.  No ST and T wave abnormality.    Assessment/Plan: Principal Problem:   Acute exacerbation of CHF (congestive heart failure) (HCC) Active Problems:   Hypertensive urgency   ESRD on dialysis [facility])   Essential hypertension   Chronic anemia   Chronic idiopathic thrombocytopenia (HCC)    Assessment and Plan: Combined systolic and diastolic heart failure exacerbation Hypertensive urgency-resolved History of essential  hypertension Patient has history of diastolic and systolic heart failure with reduced EF 45 to 50%. She is coming with complaining of worsening bilateral lower extremity swelling and missed few doses of Lasix.  She is compliant with dialysis and last HD on 06/10/2023 - At presentation patient's blood pressure found 214/102 which has been improved to 167/101 without any intervention. - Patient found to have elevated BNP 3228.  And chest x-ray showed evidence of cardiomegaly with pulmonary vascular congestion. -In the ED patient received Lasix 80 mg IV once. -At home patient takes Lasix 80 mg on nonlife dialysis  day Monday, Wednesday, Friday and Sunday. - Given patient is planning to start dialysis in the morning continue Lasix 40 mg IV twice daily and assess volume status on daily basis for diuretics dose readjustment. -Continue strict I's/O - Continue fluid restriction 2 L/day and salt restriction 2 g/day - Continue daily weight -Resumed other home blood pressure regimen include Coreg 3.125 mg twice daily, Imdur 30 mg daily and hydralazine 25 mg 3 times daily. -Continue cardiac monitoring. -Ordered echocardiogram.  ESRD on HD TTS schedule - Patient is compliant with HD.  Last HD on 06/10/2023.  Patient is due for dialysis tomorrow. - Consulted nephrology Dr. Allena Katz, plan for dialysis in the a.m. -Continue Renvela.  Anemia of chronic disease Stable H&H 11.4 and 38.2. -Check anemia panel. - Resumed folic acid. Continue to monitor.  Chronic idiopathic thrombocytopenia - Platelet count 111 around her baseline.  Continue to monitor.  DVT prophylaxis:  SQ Heparin Code Status:  Full Code.  Discussed CODE STATUS both with patient and patient's daughter at the bedside.  They wish to continue full code in terms of chest compression, intubation and mechanical ventilation if needed with full scope of treatment. Diet: Heart healthy diet Family Communication: Discussed treatment plan with family at  bedside Disposition Plan: Plan to discharge to home next 2 to 3 days. Consults:  none Admission status:   Inpatient, medical telemetry unit.  Severity of Illness: The appropriate patient status for this patient is INPATIENT. Inpatient status is judged to be reasonable and necessary in order to provide the required intensity of service to ensure the patient's safety. The patient's presenting symptoms, physical exam findings, and initial radiographic and laboratory data in the context of their chronic comorbidities is felt to place them at high risk for further clinical deterioration. Furthermore, it is not anticipated that the patient will be medically stable for discharge from the hospital within 2 midnights of admission.   * I certify that at the point of admission it is my clinical judgment that the patient will require inpatient hospital care spanning beyond 2 midnights from the point of admission due to high intensity of service, high risk for further deterioration and high frequency of surveillance required.Marland Kitchen    Tereasa Coop, MD [facility]  How to contact the [facility] Attending or Consulting provider 7A - 7P or covering provider during after hours 7P -7A, for this patient.  Check the care team in [facility] and look for a) attending/consulting [facility] provider listed and b) the [facility] team listed Log into [email or web address] and use [place]'s universal password to access. If you do not have the password, please contact the hospital operator. Locate the [facility] provider you are looking for under [facility] and page to a number that you can be directly reached. If you still have difficulty reaching the provider, please page the [facility] (Director on Call) for the Hospitalists listed on amion for assistance.  06/11/2023, 11:52 PM

## 2023-06-11 NOTE — ED Triage Notes (Signed)
Pt presents stating she was feeling unwell and her daughter took her BP and it was elevated.  Has a slight headache and feels shob.  Dialysis on Tu, Th, and Sat.  Has not missed any treatments.  No frank chest pain.  No n/v/d

## 2023-06-12 ENCOUNTER — Inpatient Hospital Stay (HOSPITAL_COMMUNITY): Payer: Medicare (Managed Care)

## 2023-06-12 ENCOUNTER — Other Ambulatory Visit (HOSPITAL_COMMUNITY): Payer: Medicare (Managed Care)

## 2023-06-12 DIAGNOSIS — I160 Hypertensive urgency: Secondary | ICD-10-CM

## 2023-06-12 DIAGNOSIS — I5021 Acute systolic (congestive) heart failure: Secondary | ICD-10-CM

## 2023-06-12 HISTORY — PX: IR THORACENTESIS ASP PLEURAL SPACE W/IMG GUIDE: IMG5380

## 2023-06-12 LAB — RETICULOCYTES
Immature Retic Fract: 21.7 % — ABNORMAL HIGH (ref 2.3–15.9)
RBC.: 3.99 MIL/uL (ref 3.87–5.11)
Retic Count, Absolute: 147.6 10*3/uL (ref 19.0–186.0)
Retic Ct Pct: 3.7 % — ABNORMAL HIGH (ref 0.4–3.1)

## 2023-06-12 LAB — VITAMIN B12: Vitamin B-12: 838 pg/mL (ref 180–914)

## 2023-06-12 LAB — CBC
HCT: 36.9 % (ref 36.0–46.0)
Hemoglobin: 11 g/dL — ABNORMAL LOW (ref 12.0–15.0)
MCH: 27.3 pg (ref 26.0–34.0)
MCHC: 29.8 g/dL — ABNORMAL LOW (ref 30.0–36.0)
MCV: 91.6 fL (ref 80.0–100.0)
Platelets: 104 10*3/uL — ABNORMAL LOW (ref 150–400)
RBC: 4.03 MIL/uL (ref 3.87–5.11)
RDW: 15 % (ref 11.5–15.5)
WBC: 2.8 10*3/uL — ABNORMAL LOW (ref 4.0–10.5)
nRBC: 0 % (ref 0.0–0.2)

## 2023-06-12 LAB — ECHOCARDIOGRAM LIMITED
Calc EF: 59.7 %
Est EF: 55
Height: 65 in
Single Plane A2C EF: 54.7 %
Single Plane A4C EF: 64 %
Weight: 2624 [oz_av]

## 2023-06-12 LAB — BODY FLUID CELL COUNT WITH DIFFERENTIAL
Eos, Fluid: 0 %
Lymphs, Fluid: 85 %
Monocyte-Macrophage-Serous Fluid: 11 % — ABNORMAL LOW (ref 50–90)
Neutrophil Count, Fluid: 4 % (ref 0–25)
Total Nucleated Cell Count, Fluid: 867 cu mm (ref 0–1000)

## 2023-06-12 LAB — IRON AND TIBC
Iron: 39 ug/dL (ref 28–170)
Saturation Ratios: 20 % (ref 10.4–31.8)
TIBC: 192 ug/dL — ABNORMAL LOW (ref 250–450)
UIBC: 153 ug/dL

## 2023-06-12 LAB — COMPREHENSIVE METABOLIC PANEL
ALT: 13 U/L (ref 0–44)
AST: 19 U/L (ref 15–41)
Albumin: 2.8 g/dL — ABNORMAL LOW (ref 3.5–5.0)
Alkaline Phosphatase: 108 U/L (ref 38–126)
Anion gap: 15 (ref 5–15)
BUN: 27 mg/dL — ABNORMAL HIGH (ref 8–23)
CO2: 24 mmol/L (ref 22–32)
Calcium: 8.7 mg/dL — ABNORMAL LOW (ref 8.9–10.3)
Chloride: 100 mmol/L (ref 98–111)
Creatinine, Ser: 2.81 mg/dL — ABNORMAL HIGH (ref 0.44–1.00)
GFR, Estimated: 18 mL/min — ABNORMAL LOW (ref >60)
Glucose, Bld: 60 mg/dL — ABNORMAL LOW (ref 70–99)
Potassium: 3.6 mmol/L (ref 3.5–5.1)
Sodium: 139 mmol/L (ref 135–145)
Total Bilirubin: 0.6 mg/dL (ref 0.3–1.2)
Total Protein: 7 g/dL (ref 6.5–8.1)

## 2023-06-12 LAB — MRSA NEXT GEN BY PCR, NASAL: MRSA by PCR Next Gen: NOT DETECTED

## 2023-06-12 LAB — PROTEIN, PLEURAL OR PERITONEAL FLUID: Total protein, fluid: 3.3 g/dL

## 2023-06-12 LAB — CBG MONITORING, ED
Glucose-Capillary: 143 mg/dL — ABNORMAL HIGH (ref 70–99)
Glucose-Capillary: 59 mg/dL — ABNORMAL LOW (ref 70–99)
Glucose-Capillary: 107 mg/dL — ABNORMAL HIGH (ref 70–99)
Glucose-Capillary: 48 mg/dL — ABNORMAL LOW (ref 70–99)
Glucose-Capillary: 78 mg/dL (ref 70–99)
Glucose-Capillary: 87 mg/dL (ref 70–99)

## 2023-06-12 LAB — GLUCOSE, CAPILLARY
Glucose-Capillary: 63 mg/dL — ABNORMAL LOW (ref 70–99)
Glucose-Capillary: 109 mg/dL — ABNORMAL HIGH (ref 70–99)

## 2023-06-12 LAB — HIV ANTIBODY (ROUTINE TESTING W REFLEX): HIV Screen 4th Generation wRfx: NONREACTIVE

## 2023-06-12 LAB — HEPATITIS B SURFACE ANTIGEN: Hepatitis B Surface Ag: NONREACTIVE

## 2023-06-12 LAB — FOLATE: Folate: 15.2 ng/mL (ref >5.9)

## 2023-06-12 LAB — FERRITIN: Ferritin: 273 ng/mL (ref 11–307)

## 2023-06-12 MED ORDER — ANTICOAGULANT SODIUM CITRATE 4% (200MG/5ML) IV SOLN: 5.0000 mL | Status: DC | PRN

## 2023-06-12 MED ORDER — NEPRO/CARBSTEADY PO LIQD: 237.0000 mL | ORAL | Status: DC | PRN

## 2023-06-12 MED ORDER — PENTAFLUOROPROP-TETRAFLUOROETH EX AERO: 1.0000 | INHALATION_SPRAY | CUTANEOUS | Status: DC | PRN

## 2023-06-12 MED ORDER — DEXTROSE 50 % IV SOLN
1.0000 | INTRAVENOUS | Status: DC | PRN
  Administered 2023-06-12: 50 mL via INTRAVENOUS
  Filled 2023-06-12: qty 50

## 2023-06-12 MED ORDER — LIDOCAINE HCL 1 % IJ SOLN
INTRAMUSCULAR | Status: AC
  Filled 2023-06-12: qty 20

## 2023-06-12 MED ORDER — LIDOCAINE-PRILOCAINE 2.5-2.5 % EX CREA: 1.0000 | TOPICAL_CREAM | CUTANEOUS | Status: DC | PRN

## 2023-06-12 MED ORDER — CHLORHEXIDINE GLUCONATE CLOTH 2 % EX PADS
6.0000 | MEDICATED_PAD | Freq: Every day | CUTANEOUS | Status: DC
  Administered 2023-06-12 – 2023-06-13 (×2): 6 via TOPICAL

## 2023-06-12 MED ORDER — HEPARIN SODIUM (PORCINE) 1000 UNIT/ML DIALYSIS
1000.0000 [IU] | INTRAMUSCULAR | Status: DC | PRN
  Administered 2023-06-12: 1000 [IU]
  Filled 2023-06-12 (×2): qty 1

## 2023-06-12 MED ORDER — LIDOCAINE HCL (PF) 1 % IJ SOLN: 5.0000 mL | INTRAMUSCULAR | Status: DC | PRN

## 2023-06-12 MED ORDER — ALTEPLASE 2 MG IJ SOLR: 2.0000 mg | Freq: Once | INTRAMUSCULAR | Status: DC | PRN

## 2023-06-12 MED ORDER — CALCITRIOL 0.5 MCG PO CAPS
0.7500 ug | ORAL_CAPSULE | ORAL | Status: DC
  Filled 2023-06-12: qty 1

## 2023-06-12 MED ORDER — HYDRALAZINE HCL 25 MG PO TABS
25.0000 mg | ORAL_TABLET | Freq: Three times a day (TID) | ORAL | Status: DC
  Administered 2023-06-12 – 2023-06-13 (×3): 25 mg via ORAL
  Filled 2023-06-12 (×3): qty 1

## 2023-06-12 MED ORDER — HEPARIN SODIUM (PORCINE) 1000 UNIT/ML DIALYSIS
40.0000 [IU]/kg | INTRAMUSCULAR | Status: DC | PRN
  Administered 2023-06-12: 3000 [IU] via INTRAVENOUS_CENTRAL
  Filled 2023-06-12 (×2): qty 3

## 2023-06-12 MED ORDER — LIDOCAINE HCL 1 % IJ SOLN
20.0000 mL | Freq: Once | INTRAMUSCULAR | Status: AC
  Administered 2023-06-12: 10 mL via INTRADERMAL
  Filled 2023-06-12: qty 20

## 2023-06-12 NOTE — ED Notes (Signed)
PT transported upstairs , called in report to [facility].

## 2023-06-12 NOTE — ED Notes (Signed)
Patient to dialysis at this time. Report given to dialysis by previous shift RN.

## 2023-06-12 NOTE — Plan of Care (Signed)

## 2023-06-12 NOTE — Procedures (Signed)
PROCEDURE SUMMARY:  Successful US guided right thoracentesis. Yielded 1 L of blood tinged fluid. Patient tolerated procedure well. No immediate complications. EBL = trace  Specimen was sent for labs.  Emiah Pellicano S Modesta Sammons [name] 06/12/2023 3:01 PM

## 2023-06-12 NOTE — Consult Note (Addendum)
[facility] Renal Consultation Note    Indication for Consultation:  Management of ESRD/hemodialysis, anemia, hypertension/volume, and secondary hyperparathyroidism. PCP:  HPI: Gael Bevels is a 72 y.o. female with ESRD, HTN, HL, HFrEF who has admitted with dyspnea/volume overload.  Started on HD in 03/2023 - was severely overload at that time. Her dry weight has consistently been lowered over the past few months (81.5kg -> 73.2kg) and her edema is substantially better. Presented to ED yesterday with headache, dyspnea, malaise. BP very high when checked by her daughter. In ED, BP was 183/97. Initially placed on O2 - but then able to maintain on room air. She was afebrile. Labs with Na 139, K 3.8, BUN 28, Cr 2.86. ^ BNP, WBC 2.7, Hgb 11.4, Plts 111. Trop 4. COVID/Flu negative. CXR shows persistent pulm edema and B pleural effusions.  Our team was consulted overnight - HD orders placed. Seen while on HD this AM - chronic dyspnea, a little better than when she came in. Denies fever, chills, CP, abd pain, N/V/D, dysuria. HA resolved at this time. She is unclear why she was so symptomatic yesterday - has been trying to limit her fluids.  Dialyzes on TTS schedule at [facility] - today is her usual HD day. Hasn't missed any treatments, meets her assigned dry weight (which needs to be lowered). Uses TDC as her access, has maturing LUE AVF + bruit/thrill.  Past Medical History:  Diagnosis Date   Chronic combined systolic and diastolic congestive heart failure (HCC) 11/09/2021   Generalized anxiety disorder    Hyperlipidemia 11/09/2021   Hypertension    Stage 3b chronic kidney disease (CKD) (HCC) 11/09/2021   Past Surgical History:  Procedure Laterality Date   AV FISTULA PLACEMENT Left 03/27/2023   Procedure: LEFT ARM BRACHIOCEPHALIC ARTERIOVENOUS (AV) FISTULA CREATION;  Surgeon: [name], MD;  Location: [facility] OR;  Service: Vascular;  Laterality: Left;   CATARACT  EXTRACTION, BILATERAL     combined systolic and diastolic congestive heart failure      INSERTION OF DIALYSIS CATHETER N/A 03/27/2023   Procedure: INSERTION OF RIGHT INTERNAL JUGULAR TUNNELED DIALYSIS CATHETER;  Surgeon: [name], MD;  Location: [facility] OR;  Service: Vascular;  Laterality: N/A;   Family History  Problem Relation Age of Onset   Throat cancer Father   Patient reports to me that she is adopted and does not know her family history of CKD or other conditions  Social History:  reports that she has never smoked. She has never used smokeless tobacco. She reports that she does not currently use alcohol. She reports that she does not use drugs.  ROS: As per HPI otherwise negative.  Physical Exam: Vitals:   06/12/23 0745 06/12/23 0756 06/12/23 0800 06/12/23 0830  BP: (!) 156/69 (!) 149/79 (!) 161/79 135/87  Pulse: 63 61 60 (!) 59  Resp: 19 19 (!) 21 16  Temp: (!) 97.4 F (36.3 C)     TempSrc:      SpO2: 96% 99% 100% 95%  Weight:      Height:         General: Well developed, well nourished woman, NAD. Room air. Head: Normocephalic, atraumatic, sclera non-icteric, mucus membranes are moist. Neck: Supple without lymphadenopathy/masses.  Lungs: Breathing unlabored. Dull B bases with occ rales. Heart: RRR with normal S1, S2. No murmurs, rubs, or gallops appreciated. Abdomen: Soft, non-tender, non-distended with normoactive bowel sounds.  Musculoskeletal:  Strength and tone appear reduced for age. Lower extremities: 2-3+ BLE  edema to posterior thighs Neuro: Alert and oriented X 3. Moves all extremities spontaneously. Psych:  Responds to questions appropriately with a normal affect. Dialysis Access: TDC + maturing LUE AVF + thrill  Allergies  Allergen Reactions   Crestor [Rosuvastatin] Shortness Of Breath   Nitrofuran Derivatives Other (See Comments)    Unknown reaction   Prior to Admission medications   Medication Sig Start Date End Date Taking? Authorizing Provider   carvedilol (COREG) 6.25 MG tablet Take 0.5 tablets (3.125 mg total) by mouth 2 (two) times daily with a meal. 05/09/23  Yes Reather Littler D, NP  furosemide (LASIX) 80 MG tablet Take 1 tablet (80 mg total) by mouth every Monday, Wednesday, Friday and Sunday. 04/22/23  Yes [name], [name], NP  hydrALAZINE (APRESOLINE) 25 MG tablet Take 1 tablet (25 mg total) by mouth every 8 (eight) hours. 04/01/23 06/30/23 Yes Almon Hercules, MD  isosorbide mononitrate (IMDUR) 30 MG 24 hr tablet Take 1 tablet (30 mg total) by mouth daily. 04/22/23  Yes [name], [name], NP  Menthol-Methyl Salicylate (MUSCLE RUB) 10-15 % CREA Apply 1 Application topically daily as needed for muscle pain.   Yes [provider]  sevelamer carbonate (RENVELA) 800 MG tablet Take 800 mg by mouth 3 (three) times daily with meals. 07/10/22  Yes [provider]  feeding supplement (ENSURE ENLIVE / ENSURE PLUS) LIQD Take 237 mLs by mouth 2 (two) times daily between meals. Patient not taking: Reported on 06/11/2023 06/17/22   Berton Mount I, MD  folic acid (FOLVITE) 1 MG tablet Take 1 tablet by mouth daily. Patient not taking: Reported on 06/11/2023    [provider]  multivitamin (RENA-VIT) TABS tablet Take 1 tablet by mouth daily. Patient not taking: Reported on 06/11/2023    [provider]   Current Facility-Administered Medications  Medication Dose Route Frequency Provider Last Rate Last Admin   acetaminophen (TYLENOL) tablet 650 mg  650 mg Oral Q6H PRN Janalyn Shy, Subrina, MD       Or   acetaminophen (TYLENOL) suppository 650 mg  650 mg Rectal Q6H PRN Janalyn Shy, Subrina, MD       alteplase (CATHFLO ACTIVASE) injection 2 mg  2 mg Intracatheter Once PRN Dagoberto Ligas, MD       anticoagulant sodium citrate solution 5 mL  5 mL Intracatheter PRN Dagoberto Ligas, MD       carvedilol (COREG) tablet 3.125 mg  3.125 mg Oral BID WC Sundil, Subrina, MD       Chlorhexidine Gluconate Cloth 2 % PADS 6 each  6 each Topical Q0600  Dagoberto Ligas, MD       dextrose 50 % solution 50 mL  1 ampule Intravenous PRN Janalyn Shy, Subrina, MD   50 mL at 06/12/23 0157   feeding supplement (ENSURE ENLIVE / ENSURE PLUS) liquid 237 mL  237 mL Oral BID BM Sundil, Subrina, MD       feeding supplement (NEPRO CARB STEADY) liquid 237 mL  237 mL Oral PRN Dagoberto Ligas, MD       folic acid (FOLVITE) tablet 1 mg  1 mg Oral Daily Sundil, Subrina, MD       furosemide (LASIX) injection 40 mg  40 mg Intravenous BID Janalyn Shy, Subrina, MD       heparin injection 1,000 Units  1,000 Units Intracatheter PRN Dagoberto Ligas, MD       heparin injection 3,000 Units  40 Units/kg Dialysis PRN Dagoberto Ligas, MD  3,000 Units at 06/12/23 0827   heparin injection 5,000 Units  5,000 Units Subcutaneous Q8H Sundil, Subrina, MD   5,000 Units at 06/12/23 0021   hydrALAZINE (APRESOLINE) injection 10 mg  10 mg Intravenous Q8H PRN Sundil, Subrina, MD       hydrALAZINE (APRESOLINE) tablet 25 mg  25 mg Oral Q8H Sundil, Subrina, MD   25 mg at 06/12/23 0525   isosorbide mononitrate (IMDUR) 24 hr tablet 30 mg  30 mg Oral Daily Sundil, Subrina, MD       lidocaine (PF) (XYLOCAINE) 1 % injection 5 mL  5 mL Intradermal PRN Dagoberto Ligas, MD       lidocaine-prilocaine (EMLA) cream 1 Application  1 Application Topical PRN Dagoberto Ligas, MD       pentafluoroprop-tetrafluoroeth Peggye Pitt) aerosol 1 Application  1 Application Topical PRN Dagoberto Ligas, MD       senna-docusate (Senokot-S) tablet 1 tablet  1 tablet Oral QHS PRN Janalyn Shy, Subrina, MD       sevelamer carbonate (RENVELA) tablet 800 mg  800 mg Oral Q supper Tereasa Coop, MD       Labs: Basic Metabolic Panel: Recent Labs  Lab 06/11/23 2038 06/12/23 0057  NA 139 139  K 3.8 3.6  CL 102 100  CO2 27 24  GLUCOSE 70 60*  BUN 28* 27*  CREATININE 2.86* 2.81*  CALCIUM 8.7* 8.7*   Liver Function Tests: Recent Labs  Lab 06/12/23 0057  AST 19  ALT 13  ALKPHOS 108  BILITOT 0.6  PROT 7.0  ALBUMIN 2.8*   CBC: Recent Labs  Lab  06/11/23 2038 06/12/23 0057  WBC 2.7* 2.8*  HGB 11.4* 11.0*  HCT 38.2 36.9  MCV 89.7 91.6  PLT 111* 104*   CBG: Recent Labs  Lab 06/12/23 0149 06/12/23 0213 06/12/23 0422  GLUCAP 59* 143* 107*   Iron Studies:  Recent Labs    06/12/23 0050  IRON 39  TIBC 192*  FERRITIN 273   Studies/Results: DG Chest 2 View  Result Date: 06/11/2023 CLINICAL DATA:  Chest pain and shortness of breath. EXAM: CHEST - 2 VIEW COMPARISON:  03/30/2023. FINDINGS: The heart is enlarged and the mediastinal contour is within normal limits. There is atherosclerotic calcification of the aorta. A right internal jugular central venous catheter appear stable. The pulmonary vasculature is distended. There are moderate bilateral pleural effusions with airspace disease at the lung bases. No pneumothorax. No acute osseous abnormality. IMPRESSION: 1. Cardiomegaly with pulmonary vascular congestion. 2. Moderate bilateral pleural effusions with edema, atelectasis, or infiltrate. Electronically Signed   By: Thornell Sartorius M.D.   On: 06/11/2023 21:15    Dialysis Orders:  TTS at [facility] 3:45hr, 180 dialyzer, 400/800, EDW 73.2kg -> lower on d/c, 3K/2.5Ca, TDC, no heparin - Mircera IV q 2 weeks - last 8/27 - Venofer IV weekly - Calcitriol 0.20mcg PO q HD  Assessment/Plan:  Acute on chronic dyspnea/overload: Acutely symptomatic - for HD now, 3L UFG, will try to more drastically reduce her dry weight. Consider thoracentesis for B effusions. On room air now.  ESRD: Usual TTS schedule - HD now, 3L UFG. Would benefit from serial HD, can be arranged as outpatient.  Hypertension/volume: BP high with edema/pleural effusions/pulm edema - UF as tolerated.  Anemia: Hgb 11, just given ESA as outpatient.  Metabolic bone disease: Ca ok, check Phos.  HFrEF  Ozzie Hoyle, [name] 06/12/2023, 9:08 AM   [facility]   Seen and examined independently.  Agree with  note and exam as documented above by physician  extender and as noted here.  Ms. Bagan is a pleasant female with a history of ESRD TTS at [place] (started on HD earlier this year) and heart failure with reduced EF who presented to the hospital with shortness of breath.  She thought yesterday was short of breath due to the heat/humidity and had her [facility] serviced but it made no different.  Came to the ER because she could hardly breathe.  She states that her EDW has been lowered quite a bit over the past few months.  She feels better on dialysis and is glad to get the fluid off.  She lives with her daughter.  Doesn't walk but does therapy and has an assist device.  Compliant with HD.  Covid was negative   General elderly female in bed in no acute distress HEENT normocephalic atraumatic extraocular movements intact sclera anicteric Neck supple trachea midline Lungs clear to auscultation bilaterally normal work of breathing at rest on room air  Heart regular rate and rhythm no rubs or gallops appreciated Abdomen soft nontender nondistended Extremities 2-3+ edema lower extremities Neuro - she is alert and oriented x3 provides hx and follows commands; she is an excellent historian  Psych normal mood and affect RIJ tunn catheter in use; maturing LUE AVF with bruit and thrill   Acute on chronic heart failure with reduced EF/fluid volume overload  - she is losing weight and we are challenging her EDW - daily weights   ESRD - HD per TTS schedule  - as above will need to challenge EDW  Hypertension  - optimize volume with HD  Anemia of CKD  - recently got ESA; no acute needs   Metabolic bone disease  - check phos. On calcitriol outpatient - resume   Thank you for the consult.  Please do not hesitate to contact us with any questions regarding our patient.    Estanislado Emms, MD 06/12/2023  10:56 AM

## 2023-06-12 NOTE — Progress Notes (Signed)
PROGRESS NOTE    Archana Terracina  YNW:[id number] DOB: 06-08-1951 DOA: 06/11/2023 PCP: Caesar Bookman, NP    Brief Narrative:  72 year old ESRD on hemodialysis compliant, hypertension hyperlipidemia, combined systolic and diastolic heart failure, anemia of chronic disease, ambulatory dysfunction presented to the emergency room with elevated blood pressure, shortness of breath even after completing hemodialysis the day before admission.  She does have chronic bilateral lower leg swelling which is rather improving now.  On admission, she was found to have blood pressure 214/102, 98% on room air.  Admitted, underwent urgent hemodialysis with clinical improvement.  Chest x-ray with bilateral pulmonary edema and effusion.   Assessment & Plan:   Hypertensive urgency, acute fluid overload secondary to poor renal clearance: Known chronic combined heart failure with ejection fraction 45 to 50%. Underwent urgent hemodialysis with improvement of blood pressure and shortness of breath. Nephrology diligently working on dialysis, targeted dry weight below normal.  Continue Lasix.  Intake output monitoring.  Fluid restrictions.  Repeat echocardiogram pending. Patient does have bilateral pleural effusion, likely transudate from fluid overload.  For symptomatic relief, will order ultrasound-guided bilateral thoracentesis. Blood pressure stable after resuming home medications.  ESRD on hemodialysis, TTS schedule: As above.  Receiving dialysis today. Anemia of chronic disease, stable Chronic idiopathic thrombocytopenia, stable   DVT prophylaxis: heparin injection 5,000 Units Start: 06/11/23 2345 SCDs Start: 06/11/23 2331 Place TED hose Start: 06/11/23 2331   Code Status: Full code Family Communication: None at the bedside Disposition Plan: Status is: Inpatient Remains inpatient appropriate because: Significant fluid overload, inpatient treatment, dialysis,     Consultants:   Nephrology  Procedures:  Hemodialysis  Antimicrobials:  None   Subjective: Patient seen and examined receiving hemodialysis.  Patient tells me that overall her leg swelling has improved.  Breathing is improved after receiving hemodialysis.  Blood pressure is better now.  Objective: Vitals:   06/12/23 1039 06/12/23 1100 06/12/23 1125 06/12/23 1131  BP: 133/75 126/72 112/63 137/66  Pulse: (!) 59 (!) 53 (!) 55 (!) 58  Resp: 13 15 15 18   Temp:   (!) 96.9 F (36.1 C) (!) 96.9 F (36.1 C)  TempSrc:   Oral Oral  SpO2: 99% 98% 98% 98%  Weight:      Height:        Intake/Output Summary (Last 24 hours) at 06/12/2023 1356 Last data filed at 06/12/2023 1131 Gross per 24 hour  Intake --  Output 3500 ml  Net -3500 ml   Filed Weights   06/12/23 0628  Weight: 74.4 kg    Examination:  General exam: Appears calm and comfortable receiving dialysis. Respiratory system: Poor air entry at bases.  No added sounds.  On room air. Cardiovascular system: S1 & S2 heard, RRR. Gastrointestinal system: Soft.  Nontender bowel sound present. Central nervous system: Alert and oriented. No focal neurological deficits. Extremities:  Generalized weakness. 3+ pedal edema both legs.    Data Reviewed: I have personally reviewed following labs and imaging studies  CBC: Recent Labs  Lab 06/11/23 2038 06/12/23 0057  WBC 2.7* 2.8*  HGB 11.4* 11.0*  HCT 38.2 36.9  MCV 89.7 91.6  PLT 111* 104*   Basic Metabolic Panel: Recent Labs  Lab 06/11/23 2038 06/12/23 0057  NA 139 139  K 3.8 3.6  CL 102 100  CO2 27 24  GLUCOSE 70 60*  BUN 28* 27*  CREATININE 2.86* 2.81*  CALCIUM 8.7* 8.7*   GFR: Estimated Creatinine Clearance: 18.8 mL/min (A) (by C-G formula based on  SCr of 2.81 mg/dL (H)). Liver Function Tests: Recent Labs  Lab 06/12/23 0057  AST 19  ALT 13  ALKPHOS 108  BILITOT 0.6  PROT 7.0  ALBUMIN 2.8*   No results for input(s): "LIPASE", "AMYLASE" in the last 168 hours. No  results for input(s): "AMMONIA" in the last 168 hours. Coagulation Profile: No results for input(s): "INR", "PROTIME" in the last 168 hours. Cardiac Enzymes: No results for input(s): "CKTOTAL", "CKMB", "CKMBINDEX", "TROPONINI" in the last 168 hours. BNP (last 3 results) No results for input(s): "PROBNP" in the last 8760 hours. HbA1C: No results for input(s): "HGBA1C" in the last 72 hours. CBG: Recent Labs  Lab 06/12/23 0149 06/12/23 0213 06/12/23 0422 06/12/23 1339  GLUCAP 59* 143* 107* 48*   Lipid Profile: No results for input(s): "CHOL", "HDL", "LDLCALC", "TRIG", "CHOLHDL", "LDLDIRECT" in the last 72 hours. Thyroid Function Tests: No results for input(s): "TSH", "T4TOTAL", "FREET4", "T3FREE", "THYROIDAB" in the last 72 hours. Anemia Panel: Recent Labs    06/12/23 0050  VITAMINB12 838  FOLATE 15.2  FERRITIN 273  TIBC 192*  IRON 39  RETICCTPCT 3.7*   Sepsis Labs: No results for input(s): "PROCALCITON", "LATICACIDVEN" in the last 168 hours.  Recent Results (from the past 240 hour(s))  Resp panel by RT-PCR (RSV, Flu A&B, Covid) Anterior Nasal Swab     Status: None   Collection Time: 06/11/23  9:45 PM   Specimen: Anterior Nasal Swab  Result Value Ref Range Status   SARS Coronavirus 2 by RT PCR NEGATIVE NEGATIVE Final   Influenza A by PCR NEGATIVE NEGATIVE Final   Influenza B by PCR NEGATIVE NEGATIVE Final    Comment: (NOTE) The Xpert Xpress SARS-CoV-2/FLU/RSV plus assay is intended as an aid in the diagnosis of influenza from Nasopharyngeal swab specimens and should not be used as a sole basis for treatment. Nasal washings and aspirates are unacceptable for Xpert Xpress SARS-CoV-2/FLU/RSV testing.  Fact Sheet for Patients: BloggerCourse.com  Fact Sheet for Healthcare Providers: SeriousBroker.it  This test is not yet approved or cleared by the [place] FDA and has been authorized for detection and/or diagnosis  of SARS-CoV-2 by FDA under an Emergency Use Authorization (EUA). This EUA will remain in effect (meaning this test can be used) for the duration of the COVID-19 declaration under Section 564(b)(1) of the Act, 21 U.S.C. section 360bbb-3(b)(1), unless the authorization is terminated or revoked.     Resp Syncytial Virus by PCR NEGATIVE NEGATIVE Final    Comment: (NOTE) Fact Sheet for Patients: BloggerCourse.com  Fact Sheet for Healthcare Providers: SeriousBroker.it  This test is not yet approved or cleared by the [place] FDA and has been authorized for detection and/or diagnosis of SARS-CoV-2 by FDA under an Emergency Use Authorization (EUA). This EUA will remain in effect (meaning this test can be used) for the duration of the COVID-19 declaration under Section 564(b)(1) of the Act, 21 U.S.C. section 360bbb-3(b)(1), unless the authorization is terminated or revoked.  Performed at [facility], [address], [place], [place] [address]          Radiology Studies: DG Chest 2 View  Result Date: 06/11/2023 CLINICAL DATA:  Chest pain and shortness of breath. EXAM: CHEST - 2 VIEW COMPARISON:  03/30/2023. FINDINGS: The heart is enlarged and the mediastinal contour is within normal limits. There is atherosclerotic calcification of the aorta. A right internal jugular central venous catheter appear stable. The pulmonary vasculature is distended. There are moderate bilateral pleural effusions with airspace disease  at the lung bases. No pneumothorax. No acute osseous abnormality. IMPRESSION: 1. Cardiomegaly with pulmonary vascular congestion. 2. Moderate bilateral pleural effusions with edema, atelectasis, or infiltrate. Electronically Signed   By: Thornell Sartorius M.D.   On: 06/11/2023 21:15        Scheduled Meds:  calcitRIOL  0.75 mcg Oral Q T,Th,Sa-HD   carvedilol  3.125 mg Oral BID WC   Chlorhexidine Gluconate Cloth  6 each  Topical Q0600   feeding supplement  237 mL Oral BID BM   folic acid  1 mg Oral Daily   furosemide  40 mg Intravenous BID   heparin  5,000 Units Subcutaneous Q8H   hydrALAZINE  25 mg Oral Q8H   isosorbide mononitrate  30 mg Oral Daily   sevelamer carbonate  800 mg Oral Q supper   Continuous Infusions:   LOS: 1 day    Time spent: 35 minutes    Dorcas Carrow, MD [facility]

## 2023-06-12 NOTE — ED Notes (Signed)
ED TO INPATIENT HANDOFF REPORT  ED Nurse Name and Phone #: Rodney Booze [phone]  S Name/Age/Gender Kara Mullins 72 y.o. female Room/Bed: 037C/037C  Code Status   Code Status: Full Code  Home/SNF/Other Home Patient oriented to: self, place, time, and situation Is this baseline? Yes   Triage Complete: Triage complete  Chief Complaint CHF exacerbation (HCC) [I50.9] ESRD (end stage renal disease) on dialysis (HCC) [N18.6, Z99.2] Hypertensive urgency [I16.0] Acute on chronic congestive heart failure, unspecified heart failure type (HCC) [I50.9]  Triage Note Pt presents stating she was feeling unwell and her daughter took her BP and it was elevated.  Has a slight headache and feels shob.  Dialysis on Tu, Th, and Sat.  Has not missed any treatments.  No frank chest pain.  No n/v/d   Allergies Allergies  Allergen Reactions   Crestor [Rosuvastatin] Shortness Of Breath   Nitrofuran Derivatives Other (See Comments)    Unknown reaction    Level of Care/Admitting Diagnosis ED Disposition     ED Disposition  Admit   Condition  --   Comment  Hospital Area: [facility] [[id number]]  Level of Care: Telemetry Medical [[id number]]  May admit patient to [facility] or [facility] if equivalent level of care is available:: No  Covid Evaluation: Asymptomatic - no recent exposure (last 10 days) testing not required  Diagnosis: CHF exacerbation [facility]) [[id number]]  Admitting Physician: Tereasa Coop [[id number]]  Attending Physician: Tereasa Coop [[id number]]  Certification:: I certify this patient will need inpatient services for at least 2 midnights  Expected Medical Readiness: 06/14/2023          B Medical/Surgery History Past Medical History:  Diagnosis Date   Chronic combined systolic and diastolic congestive heart failure (HCC) 11/09/2021   Generalized anxiety disorder    Hyperlipidemia 11/09/2021   Hypertension    Stage 3b chronic kidney disease (CKD) (HCC) 11/09/2021    Past Surgical History:  Procedure Laterality Date   AV FISTULA PLACEMENT Left 03/27/2023   Procedure: LEFT ARM BRACHIOCEPHALIC ARTERIOVENOUS (AV) FISTULA CREATION;  Surgeon: [name], MD;  Location: [facility] OR;  Service: Vascular;  Laterality: Left;   CATARACT EXTRACTION, BILATERAL     combined systolic and diastolic congestive heart failure      INSERTION OF DIALYSIS CATHETER N/A 03/27/2023   Procedure: INSERTION OF RIGHT INTERNAL JUGULAR TUNNELED DIALYSIS CATHETER;  Surgeon: [name], MD;  Location: [facility] OR;  Service: Vascular;  Laterality: N/A;   IR THORACENTESIS ASP PLEURAL SPACE W/IMG GUIDE  06/12/2023     A IV Location/Drains/Wounds Patient Lines/Drains/Airways Status     Active Line/Drains/Airways     Name Placement date Placement time Site Days   Peripheral IV 06/11/23 20 G Proximal;Right Antecubital 06/11/23  2151  Antecubital  1   Fistula / Graft Left Upper arm Arteriovenous fistula 03/27/23  1441  Upper arm  77   Hemodialysis Catheter Right Subclavian 03/27/23  1000  Subclavian  77   Pressure Injury 03/28/22 Sacrum Mid Stage 1 -  Intact skin with non-blanchable redness of a localized area usually over a bony prominence. Area is darker and non-blanchable. Intact, clean, dry. 03/28/22  0126  -- 441   Wound / Incision (Open or Dehisced) 03/13/21 Puncture Sacrum CT bone marrow biopsy 03/13/21  1543  Sacrum  821   Wound / Incision (Open or Dehisced) 11/12/21 (MASD) Moisture Associated Skin Damage Sacrum Left small area of moist, broken skin appearing moisture related 11/12/21  0900  Sacrum  577  Wound / Incision (Open or Dehisced) 03/22/23 Buttocks Right 03/22/23  1558  Buttocks  82            Intake/Output Last 24 hours  Intake/Output Summary (Last 24 hours) at 06/12/2023 1614 Last data filed at 06/12/2023 1131 Gross per 24 hour  Intake --  Output 3500 ml  Net -3500 ml    Labs/Imaging Results for orders placed or performed during the hospital encounter of  06/11/23 (from the past 48 hour(s))  Basic metabolic panel     Status: Abnormal   Collection Time: 06/11/23  8:38 PM  Result Value Ref Range   Sodium 139 135 - 145 mmol/L   Potassium 3.8 3.5 - 5.1 mmol/L   Chloride 102 98 - 111 mmol/L   CO2 27 22 - 32 mmol/L   Glucose, Bld 70 70 - 99 mg/dL    Comment: Glucose reference range applies only to samples taken after fasting for at least 8 hours.   BUN 28 (H) 8 - 23 mg/dL   Creatinine, Ser 5.57 (H) 0.44 - 1.00 mg/dL   Calcium 8.7 (L) 8.9 - 10.3 mg/dL   GFR, Estimated 17 (L) >60 mL/min    Comment: (NOTE) Calculated using the CKD-EPI Creatinine Equation (2021)    Anion gap 10 5 - 15    Comment: Performed at [facility], [address], [place], [place] [address]  CBC     Status: Abnormal   Collection Time: 06/11/23  8:38 PM  Result Value Ref Range   WBC 2.7 (L) 4.0 - 10.5 K/uL   RBC 4.26 3.87 - 5.11 MIL/uL   Hemoglobin 11.4 (L) 12.0 - 15.0 g/dL   HCT 54.2 70.6 - 23.7 %   MCV 89.7 80.0 - 100.0 fL   MCH 26.8 26.0 - 34.0 pg   MCHC 29.8 (L) 30.0 - 36.0 g/dL   RDW 62.8 31.5 - 17.6 %   Platelets 111 (L) 150 - 400 K/uL    Comment: REPEATED TO VERIFY   nRBC 0.0 0.0 - 0.2 %    Comment: Performed at [facility], [address], [place], [place] [address]  Troponin I (High Sensitivity)     Status: None   Collection Time: 06/11/23  8:38 PM  Result Value Ref Range   Troponin I (High Sensitivity) 5 <18 ng/L    Comment: (NOTE) Elevated high sensitivity troponin I (hsTnI) values and significant  changes across serial measurements may suggest ACS but many other  chronic and acute conditions are known to elevate hsTnI results.  Refer to the "Links" section for chest pain algorithms and additional  guidance. Performed at [facility], [address] [place], [place] [address]   Resp panel by RT-PCR (RSV, Flu A&B, Covid) Anterior Nasal Swab     Status: None   Collection Time: 06/11/23  9:45 PM   Specimen: Anterior Nasal Swab   Result Value Ref Range   SARS Coronavirus 2 by RT PCR NEGATIVE NEGATIVE   Influenza A by PCR NEGATIVE NEGATIVE   Influenza B by PCR NEGATIVE NEGATIVE    Comment: (NOTE) The Xpert Xpress SARS-CoV-2/FLU/RSV plus assay is intended as an aid in the diagnosis of influenza from Nasopharyngeal swab specimens and should not be used as a sole basis for treatment. Nasal washings and aspirates are unacceptable for Xpert Xpress SARS-CoV-2/FLU/RSV testing.  Fact Sheet for Patients: BloggerCourse.com  Fact Sheet for Healthcare Providers: SeriousBroker.it  This test is not yet approved or cleared by  the [employer] and has been authorized for detection and/or diagnosis of SARS-CoV-2 by FDA under an Emergency Use Authorization (EUA). This EUA will remain in effect (meaning this test can be used) for the duration of the COVID-19 declaration under Section 564(b)(1) of the Act, 21 U.S.C. section 360bbb-3(b)(1), unless the authorization is terminated or revoked.     Resp Syncytial Virus by PCR NEGATIVE NEGATIVE    Comment: (NOTE) Fact Sheet for Patients: BloggerCourse.com  Fact Sheet for Healthcare Providers: SeriousBroker.it  This test is not yet approved or cleared by the [place] FDA and has been authorized for detection and/or diagnosis of SARS-CoV-2 by FDA under an Emergency Use Authorization (EUA). This EUA will remain in effect (meaning this test can be used) for the duration of the COVID-19 declaration under Section 564(b)(1) of the Act, 21 U.S.C. section 360bbb-3(b)(1), unless the authorization is terminated or revoked.  Performed at [facility], [address] [place], [place] [address]   Brain natriuretic peptide     Status: Abnormal   Collection Time: 06/11/23  9:45 PM  Result Value Ref Range   B Natriuretic Peptide 3,228.3 (H) 0.0 - 100.0 pg/mL    Comment:  Performed at [facility], [address], [place], [place] [address]  Troponin I (High Sensitivity)     Status: None   Collection Time: 06/11/23 10:06 PM  Result Value Ref Range   Troponin I (High Sensitivity) 4 <18 ng/L    Comment: (NOTE) Elevated high sensitivity troponin I (hsTnI) values and significant  changes across serial measurements may suggest ACS but many other  chronic and acute conditions are known to elevate hsTnI results.  Refer to the "Links" section for chest pain algorithms and additional  guidance. Performed at [facility], [address] [place], [place] [address]   Iron and TIBC     Status: Abnormal   Collection Time: 06/12/23 12:50 AM  Result Value Ref Range   Iron 39 28 - 170 ug/dL   TIBC 213 (L) 086 - 578 ug/dL   Saturation Ratios 20 10.4 - 31.8 %   UIBC 153 ug/dL    Comment: Performed at [facility], [address] [place], [place] [address]  Ferritin     Status: None   Collection Time: 06/12/23 12:50 AM  Result Value Ref Range   Ferritin 273 11 - 307 ng/mL    Comment: Performed at [facility], [address] [place], [place] [address]  Vitamin B12     Status: None   Collection Time: 06/12/23 12:50 AM  Result Value Ref Range   Vitamin B-12 838 180 - 914 pg/mL    Comment: (NOTE) This assay is not validated for testing neonatal or myeloproliferative syndrome specimens for Vitamin B12 levels. Performed at [facility], [address], [place], [place] [address]   Folate     Status: None   Collection Time: 06/12/23 12:50 AM  Result Value Ref Range   Folate 15.2 >5.9 ng/mL    Comment: Performed at [facility], [address], [place], [place] [address]  Reticulocytes     Status: Abnormal   Collection Time: 06/12/23 12:50 AM  Result Value Ref Range   Retic Ct Pct 3.7 (H) 0.4 - 3.1 %   RBC. 3.99 3.87 - 5.11 MIL/uL   Retic Count, Absolute 147.6 19.0 - 186.0 K/uL   Immature Retic Fract 21.7 (H) 2.3 - 15.9 %  Comment: Performed at [facility], [address], [place], [place] [address]  HIV Antibody (routine testing w rflx)     Status: None   Collection Time: 06/12/23 12:57 AM  Result Value Ref Range   HIV Screen 4th Generation wRfx Non Reactive Non Reactive    Comment: Performed at [facility], [address] [place], [place] [address]  CBC     Status: Abnormal   Collection Time: 06/12/23 12:57 AM  Result Value Ref Range   WBC 2.8 (L) 4.0 - 10.5 K/uL   RBC 4.03 3.87 - 5.11 MIL/uL   Hemoglobin 11.0 (L) 12.0 - 15.0 g/dL   HCT 06.2 37.6 - 28.3 %   MCV 91.6 80.0 - 100.0 fL   MCH 27.3 26.0 - 34.0 pg   MCHC 29.8 (L) 30.0 - 36.0 g/dL   RDW 15.1 76.1 - 60.7 %   Platelets 104 (L) 150 - 400 K/uL    Comment: REPEATED TO VERIFY   nRBC 0.0 0.0 - 0.2 %    Comment: Performed at [facility], [address], [place], [place] [address]  Comprehensive metabolic panel     Status: Abnormal   Collection Time: 06/12/23 12:57 AM  Result Value Ref Range   Sodium 139 135 - 145 mmol/L   Potassium 3.6 3.5 - 5.1 mmol/L   Chloride 100 98 - 111 mmol/L   CO2 24 22 - 32 mmol/L   Glucose, Bld 60 (L) 70 - 99 mg/dL    Comment: Glucose reference range applies only to samples taken after fasting for at least 8 hours.   BUN 27 (H) 8 - 23 mg/dL   Creatinine, Ser 2.69 (H) 0.44 - 1.00 mg/dL   Calcium 8.7 (L) 8.9 - 10.3 mg/dL   Total Protein 7.0 6.5 - 8.1 g/dL   Albumin 2.8 (L) 3.5 - 5.0 g/dL   AST 19 15 - 41 U/L   ALT 13 0 - 44 U/L   Alkaline Phosphatase 108 38 - 126 U/L   Total Bilirubin 0.6 0.3 - 1.2 mg/dL   GFR, Estimated 18 (L) >60 mL/min    Comment: (NOTE) Calculated using the CKD-EPI Creatinine Equation (2021)    Anion gap 15 5 - 15    Comment: Performed at [facility], [address], [place], [place] [address]  CBG monitoring, ED     Status: Abnormal   Collection Time: 06/12/23  1:49 AM  Result Value Ref Range   Glucose-Capillary 59 (L) 70 - 99 mg/dL    Comment: Glucose  reference range applies only to samples taken after fasting for at least 8 hours.  CBG monitoring, ED     Status: Abnormal   Collection Time: 06/12/23  2:13 AM  Result Value Ref Range   Glucose-Capillary 143 (H) 70 - 99 mg/dL    Comment: Glucose reference range applies only to samples taken after fasting for at least 8 hours.  CBG monitoring, ED     Status: Abnormal   Collection Time: 06/12/23  4:22 AM  Result Value Ref Range   Glucose-Capillary 107 (H) 70 - 99 mg/dL    Comment: Glucose reference range applies only to samples taken after fasting for at least 8 hours.  Hepatitis B surface antigen     Status: None   Collection Time: 06/12/23  5:34 AM  Result Value Ref Range   Hepatitis B Surface Ag NON REACTIVE NON REACTIVE    Comment: Performed at [facility], [address], [place], [place] [address]  CBG monitoring, ED     Status: Abnormal   Collection Time: 06/12/23  1:39 PM  Result Value Ref Range   Glucose-Capillary 48 (L) 70 - 99 mg/dL    Comment: Glucose reference range applies only to samples taken after fasting for at least 8 hours.  CBG monitoring, ED     Status: None   Collection Time: 06/12/23  2:10 PM  Result Value Ref Range   Glucose-Capillary 78 70 - 99 mg/dL    Comment: Glucose reference range applies only to samples taken after fasting for at least 8 hours.  CBG monitoring, ED     Status: None   Collection Time: 06/12/23  3:31 PM  Result Value Ref Range   Glucose-Capillary 87 70 - 99 mg/dL    Comment: Glucose reference range applies only to samples taken after fasting for at least 8 hours.   DG Chest 1 View  Result Date: 06/12/2023 CLINICAL DATA:  [id number] Pleural effusion on right [id number] EXAM: PORTABLE CHEST 1 VIEW COMPARISON:  IR ultrasound, earlier same day. Chest XR, 06/11/2023. CT chest, 04/22/2023. FINDINGS: Support lines: RIGHT chest tunneled hemodialysis catheter, with tip within the proximal RIGHT atrium. The cardiac apex is obscured. Improved aeration  of the RIGHT lung with resolution of known prior RIGHT pleural effusion. No pneumothorax. Small-to-moderate volume LEFT pleural effusion, with associated consolidation. No interval osseous abnormality. IMPRESSION: 1. No pneumothorax or residual pleural effusion post RIGHT thoracentesis. 2. Small-to-moderate volume LEFT pleural effusion, with adjacent basilar consolidation, likely atelectasis. Electronically Signed   By: Roanna Banning M.D.   On: 06/12/2023 15:09   IR THORACENTESIS ASP PLEURAL SPACE W/IMG GUIDE  Result Date: 06/12/2023 INDICATION: End-stage renal disease on hemodialysis and congestive heart failure with right pleural effusion. Request for diagnostic and therapeutic thoracentesis. EXAM: ULTRASOUND GUIDED RIGHT THORACENTESIS MEDICATIONS: 1% lidocaine 10 mL COMPLICATIONS: None immediate. PROCEDURE: An ultrasound guided thoracentesis was thoroughly discussed with the patient and questions answered. The benefits, risks, alternatives and complications were also discussed. The patient understands and wishes to proceed with the procedure. Written consent was obtained. Ultrasound was performed to localize and mark an adequate pocket of fluid in the right chest. The area was then prepped and draped in the normal sterile fashion. 1% Lidocaine was used for local anesthesia. Under ultrasound guidance a 6 Fr Safe-T-Centesis catheter was introduced. Thoracentesis was performed. The catheter was removed and a dressing applied. FINDINGS: A total of approximately 1 L of blood-tinged fluid was removed. Samples were sent to the laboratory as requested by the clinical team. IMPRESSION: Successful ultrasound guided diagnostic and therapeutic RIGHT thoracentesis yielding 1 L of pleural fluid. Procedure performed by: Corrin Parker, [name] Electronically Signed   By: Roanna Banning M.D.   On: 06/12/2023 15:06   DG Chest 2 View  Result Date: 06/11/2023 CLINICAL DATA:  Chest pain and shortness of breath. EXAM: CHEST - 2 VIEW  COMPARISON:  03/30/2023. FINDINGS: The heart is enlarged and the mediastinal contour is within normal limits. There is atherosclerotic calcification of the aorta. A right internal jugular central venous catheter appear stable. The pulmonary vasculature is distended. There are moderate bilateral pleural effusions with airspace disease at the lung bases. No pneumothorax. No acute osseous abnormality. IMPRESSION: 1. Cardiomegaly with pulmonary vascular congestion. 2. Moderate bilateral pleural effusions with edema, atelectasis, or infiltrate. Electronically Signed   By: Thornell Sartorius M.D.   On: 06/11/2023 21:15    Pending Labs [employer] (  From admission, onward)     Start     Ordered   06/12/23 1524  Protein, pleural or peritoneal fluid  RELEASE UPON ORDERING,   STAT        06/12/23 1524   06/12/23 1524  Body fluid culture w Gram Stain  RELEASE UPON ORDERING,   STAT        06/12/23 1524   06/12/23 1524  Body fluid cell count with differential  RELEASE UPON ORDERING,   STAT        06/12/23 1524   06/12/23 0500  CBC  Daily,   R      06/11/23 2344   06/12/23 0500  Comprehensive metabolic panel  Daily,   R      06/11/23 2344   06/12/23 0426  Hepatitis B surface antibody,quantitative  (New Admission Hemo Labs (Hepatitis B))  Once,   R        06/12/23 0428            Vitals/Pain Today's Vitals   06/12/23 1147 06/12/23 1436 06/12/23 1450 06/12/23 1530  BP:  (!) 103/54 (!) 94/50 (!) 146/64  Pulse:      Resp:    11  Temp:      TempSrc:      SpO2:      Weight:      Height:      PainSc: 0-No pain       Isolation Precautions No active isolations  Medications Medications  carvedilol (COREG) tablet 3.125 mg (has no administration in time range)  isosorbide mononitrate (IMDUR) 24 hr tablet 30 mg (has no administration in time range)  sevelamer carbonate (RENVELA) tablet 800 mg (has no administration in time range)  folic acid (FOLVITE) tablet 1 mg (has no administration in time  range)  feeding supplement (ENSURE ENLIVE / ENSURE PLUS) liquid 237 mL (has no administration in time range)  heparin injection 5,000 Units (0 Units Subcutaneous Hold 06/12/23 0558)  acetaminophen (TYLENOL) tablet 650 mg (has no administration in time range)    Or  acetaminophen (TYLENOL) suppository 650 mg (has no administration in time range)  senna-docusate (Senokot-S) tablet 1 tablet (has no administration in time range)  hydrALAZINE (APRESOLINE) injection 10 mg (has no administration in time range)  furosemide (LASIX) injection 40 mg (has no administration in time range)  hydrALAZINE (APRESOLINE) tablet 25 mg (25 mg Oral Given 06/12/23 0525)  dextrose 50 % solution 50 mL (50 mLs Intravenous Given 06/12/23 0157)  Chlorhexidine Gluconate Cloth 2 % PADS 6 each (has no administration in time range)  calcitRIOL (ROCALTROL) capsule 0.75 mcg (has no administration in time range)  furosemide (LASIX) injection 80 mg (80 mg Intravenous Given 06/12/23 0013)  hydrALAZINE (APRESOLINE) injection 10 mg (10 mg Intravenous Given 06/12/23 0016)  lidocaine (XYLOCAINE) 1 % (with pres) injection 20 mL (10 mLs Intradermal Given 06/12/23 1442)    Mobility walks     Focused Assessments Cardiac Assessment Handoff:  Cardiac Rhythm: Normal sinus rhythm No results found for: "CKTOTAL", "CKMB", "CKMBINDEX", "TROPONINI" Lab Results  Component Value Date   DDIMER 2.11 (H) 01/02/2022   Does the Patient currently have chest pain? No    R Recommendations: See Admitting Provider Note  Report given to:   Additional Notes:

## 2023-06-12 NOTE — Progress Notes (Signed)
Pt receives out-pt HD at [facility] 6:55 am chair time. Will assist as needed.   Olivia Canter Renal Navigator [phone]

## 2023-06-12 NOTE — ED Notes (Signed)
Medications were overdue when I collected report at 1500. Addressed this with offgoing nurse.

## 2023-06-12 NOTE — ED Notes (Signed)
Floor called and updated on pt's arrival.

## 2023-06-12 NOTE — Progress Notes (Signed)
   06/12/23 1131  Vitals  Temp (!) 96.9 F (36.1 C)  Pulse Rate (!) 58  Resp 18  BP 137/66  SpO2 98 %  O2 Device Room Air  Oxygen Therapy  Patient Activity (if Appropriate) In bed  Pulse Oximetry Type Continuous  Post Treatment  Dialyzer Clearance Lightly streaked  Hemodialysis Intake (mL) 0 mL  Liters Processed 84  Fluid Removed (mL) 3500 mL (mLs)  Tolerated HD Treatment Yes  Post-Hemodialysis Comments tx completed. Tolerated well. PT alert. No concerns/complaints. Vitals stable.   Received patient in bed to unit.  Alert and oriented.  Informed consent signed and in chart.   TX duration:3.5hrs  Patient tolerated well.  Transported back to the room  Alert, without acute distress.  Hand-off given to patient's nurse.   Access used: [facility]  Access issues: none  Total UF removed: 3500 ml Medication(s) given: none  [facility]

## 2023-06-13 DIAGNOSIS — I160 Hypertensive urgency: Secondary | ICD-10-CM | POA: Diagnosis not present

## 2023-06-13 LAB — CBC
HCT: 33.7 % — ABNORMAL LOW (ref 36.0–46.0)
Hemoglobin: 10.1 g/dL — ABNORMAL LOW (ref 12.0–15.0)
MCH: 26.6 pg (ref 26.0–34.0)
MCHC: 30 g/dL (ref 30.0–36.0)
MCV: 88.7 fL (ref 80.0–100.0)
Platelets: 105 10*3/uL — ABNORMAL LOW (ref 150–400)
RBC: 3.8 MIL/uL — ABNORMAL LOW (ref 3.87–5.11)
RDW: 15.2 % (ref 11.5–15.5)
WBC: 2.7 10*3/uL — ABNORMAL LOW (ref 4.0–10.5)
nRBC: 0 % (ref 0.0–0.2)

## 2023-06-13 LAB — COMPREHENSIVE METABOLIC PANEL
ALT: 16 U/L (ref 0–44)
AST: 25 U/L (ref 15–41)
Albumin: 2.4 g/dL — ABNORMAL LOW (ref 3.5–5.0)
Alkaline Phosphatase: 100 U/L (ref 38–126)
Anion gap: 10 (ref 5–15)
BUN: 20 mg/dL (ref 8–23)
CO2: 28 mmol/L (ref 22–32)
Calcium: 8.1 mg/dL — ABNORMAL LOW (ref 8.9–10.3)
Chloride: 97 mmol/L — ABNORMAL LOW (ref 98–111)
Creatinine, Ser: 2.48 mg/dL — ABNORMAL HIGH (ref 0.44–1.00)
GFR, Estimated: 20 mL/min — ABNORMAL LOW (ref >60)
Glucose, Bld: 65 mg/dL — ABNORMAL LOW (ref 70–99)
Potassium: 3.6 mmol/L (ref 3.5–5.1)
Sodium: 135 mmol/L (ref 135–145)
Total Bilirubin: 0.8 mg/dL (ref 0.3–1.2)
Total Protein: 6.5 g/dL (ref 6.5–8.1)

## 2023-06-13 LAB — GLUCOSE, CAPILLARY
Glucose-Capillary: 62 mg/dL — ABNORMAL LOW (ref 70–99)
Glucose-Capillary: 62 mg/dL — ABNORMAL LOW (ref 70–99)
Glucose-Capillary: 67 mg/dL — ABNORMAL LOW (ref 70–99)
Glucose-Capillary: 73 mg/dL (ref 70–99)
Glucose-Capillary: 74 mg/dL (ref 70–99)
Glucose-Capillary: 83 mg/dL (ref 70–99)

## 2023-06-13 LAB — PATHOLOGIST SMEAR REVIEW

## 2023-06-13 LAB — HEPATITIS B SURFACE ANTIBODY, QUANTITATIVE: Hep B S AB Quant (Post): 6.7 m[IU]/mL — ABNORMAL LOW

## 2023-06-13 MED ORDER — SEVELAMER CARBONATE 800 MG PO TABS
800.0000 mg | ORAL_TABLET | Freq: Three times a day (TID) | ORAL | Status: DC
  Administered 2023-06-13: 800 mg via ORAL
  Filled 2023-06-13: qty 1

## 2023-06-13 MED ORDER — CALCITRIOL 0.25 MCG PO CAPS: 0.7500 ug | ORAL_CAPSULE | ORAL | 0 refills | Status: AC

## 2023-06-13 NOTE — Plan of Care (Signed)

## 2023-06-13 NOTE — Progress Notes (Signed)
D/C order noted. Contacted [facility] to advise clinic of pt's d/c today and that pt should resume care tomorrow.   Tracy Mounce Renal Navigator [phone] 

## 2023-06-13 NOTE — TOC Initial Note (Signed)
Transition of Care (TOC) - Initial/Assessment Note   Received a call from Elsie with [facility]. Patient is active with them for [facility] and HHPT.   Patient asleep , spoke to daughter Raini Shenberger, confirmed same and they want to continue services with [facility]. NCM requested home health orders and face to face.   Patient has hospital bed, bedside commode , wheelchair and sit to stand at home already.   Family will provide transportation home  Patient Details  Name: Kara Mullins MRN: [id number] Date of Birth: 07/10/1951  Transition of Care [facility]) CM/SW Contact:    Kingsley Plan, RN Phone Number: 06/13/2023, 11:13 AM  Clinical Narrative:                   Expected Discharge Plan: Home w Home Health Services Barriers to Discharge: No Barriers Identified   Patient Goals and CMS Choice   CMS Medicare.gov Compare Post Acute Care list provided to:: Patient Represenative (must comment) Corinna Capra) Choice offered to / list presented to : Adult Children      Expected Discharge Plan and Services   Discharge Planning Services: CM Consult Post Acute Care Choice: Home Health Living arrangements for the past 2 months: Single Family Home Expected Discharge Date: 06/13/23               DME Arranged: N/A                    Prior Living Arrangements/Services Living arrangements for the past 2 months: Single Family Home Lives with:: Adult Children Patient language and need for interpreter reviewed:: Yes        Need for Family Participation in Patient Care: Yes (Comment) Care giver support system in place?: Yes (comment) Current home services: DME Criminal Activity/Legal Involvement Pertinent to Current Situation/Hospitalization: No - Comment as needed  Activities of Daily Living Home Assistive Devices/Equipment: Bedside commode/3-in-1, Blood pressure cuff, Eyeglasses, Hospital bed, Transfer board, Wheelchair, Other (Comment) (Steady) ADL Screening (condition at time  of admission) Patient's cognitive ability adequate to safely complete daily activities?: Yes Is the patient deaf or have difficulty hearing?: No Does the patient have difficulty seeing, even when wearing glasses/contacts?: No Does the patient have difficulty concentrating, remembering, or making decisions?: No Patient able to express need for assistance with ADLs?: Yes Does the patient have difficulty dressing or bathing?: Yes Independently performs ADLs?: No Communication: Independent Dressing (OT): Dependent Is this a change from baseline?: Pre-admission baseline Grooming: Needs assistance Is this a change from baseline?: Pre-admission baseline Feeding: Independent Bathing: Needs assistance Is this a change from baseline?: Pre-admission baseline Toileting: Dependent Is this a change from baseline?: Pre-admission baseline In/Out Bed: Needs assistance Is this a change from baseline?: Pre-admission baseline Walks in Home: Dependent Is this a change from baseline?: Pre-admission baseline Does the patient have difficulty walking or climbing stairs?: Yes Weakness of Legs: Both Weakness of Arms/Hands: None  Permission Sought/Granted   Permission granted to share information with : Yes, Verbal Permission Granted  Share Information with NAME: daughter Irish Kupka           Emotional Assessment Appearance:: Appears stated age       Alcohol / Substance Use: Not Applicable Psych Involvement: No (comment)  Admission diagnosis:  CHF exacerbation (HCC) [I50.9] ESRD (end stage renal disease) on dialysis (HCC) [N18.6, Z99.2] Hypertensive urgency [I16.0] Acute on chronic congestive heart failure, unspecified heart failure type [facility]) [I50.9] Patient Active Problem List   Diagnosis Date Noted  ESRD on dialysis (HCC) 06/11/2023   Chronic idiopathic thrombocytopenia (HCC) 06/11/2023   Acute on chronic diastolic (congestive) heart failure (HCC) 03/22/2023   Anemia of chronic disease  03/22/2023   Sacral wound 03/22/2023   Acute on chronic diastolic CHF (congestive heart failure) (HCC) 02/26/2023   Cardiorenal syndrome 02/25/2023   Chronic kidney disease (CKD), stage IV (severe) (HCC) 02/25/2023   Hypoglycemia 08/15/2022   Hypertensive emergency 08/15/2022   Obesity (BMI 30-39.9) 08/15/2022   Acute on chronic diastolic heart failure (HCC) 08/14/2022   AKI (acute kidney injury) (HCC)    (HFpEF) heart failure with preserved ejection fraction (HCC) 06/02/2022   Acute respiratory distress 06/02/2022   Bilateral lower extremity edema 06/02/2022   Hypocalcemia 06/02/2022   Subclinical hypothyroidism 06/02/2022   Transaminitis 06/02/2022   Acute kidney injury superimposed on chronic kidney disease (HCC) 03/28/2022   Anasarca associated with disorder of kidney 03/27/2022   Leukopenia 03/27/2022   Aortic atherosclerosis (HCC) 03/27/2022   Pressure injury of skin 01/02/2022   Hypotension, unspecified 12/30/2021   Pneumonia due to COVID-19 virus 12/26/2021   Palliative care by specialist    Goals of care, counseling/discussion    General weakness    Sepsis (HCC) 12/22/2021   COVID-19 virus infection 12/22/2021   Paroxysmal atrial fibrillation (HCC) 12/22/2021   CKD (chronic kidney disease) stage 5, GFR less than 15 ml/min (HCC) 12/22/2021   Chronic anemia 12/22/2021   Prolonged QT interval 12/22/2021   Debility 11/20/2021   Metabolic acidosis 11/20/2021   Severe sepsis (HCC) 11/19/2021   Hypothermia 11/09/2021   Hyperlipidemia 11/09/2021   Chronic combined systolic and diastolic congestive heart failure (HCC) 11/09/2021   Pancytopenia (HCC) 11/09/2021   Severe protein-calorie malnutrition (HCC) 11/09/2021   Acute combined systolic and diastolic HF (heart failure) (HCC)    Acute exacerbation of CHF (congestive heart failure) (HCC) 03/06/2021   Essential hypertension    Hypertensive urgency 03/05/2021   Acute CHF (congestive heart failure) (HCC) 03/05/2021   Nausea  and vomiting 03/05/2021   Elevated troponin level not due myocardial infarction 03/05/2021   Elevated d-dimer 03/05/2021   Acute congestive heart failure (HCC) 03/05/2021   PCP:  Caesar Bookman, NP Pharmacy:   [facility] #[id number] [place] Otto, [place] - [address] AT [place] [facility] OF [facility] & [address] [place] [address] Phone: [phone] Fax: [phone]  [facility] #[id number] [place] Otto, [place] - [address] AT [facility] OF [address] & [address] [place] [address] Phone: [phone] Fax: [phone]  [facility] [address] [place] [address] Phone: [phone] Fax: [phone]     Social Determinants of Health (SDOH) Social History: SDOH Screenings   Food Insecurity: No Food Insecurity (06/12/2023)  Housing: Low Risk  (06/12/2023)  Transportation Needs: No Transportation Needs (06/12/2023)  Utilities: Not At Risk (06/12/2023)  Alcohol Screen: Low Risk  (06/11/2022)  Depression (PHQ2-9): Low Risk  (01/27/2023)  Financial Resource Strain: Low Risk  (06/11/2022)  Tobacco Use: Low Risk  (06/11/2023)   SDOH Interventions:     Readmission Risk Interventions    03/24/2023    2:26 PM 02/26/2023    2:53 PM 06/17/2022    3:36 PM  Readmission Risk Prevention Plan  Transportation Screening Complete Complete   PCP or Specialist Appt within 3-5 Days Complete    HRI or Home Care Consult Complete Complete   Social Work Consult for Recovery Care  Planning/Counseling -- Complete   Palliative Care Screening Not Applicable Not Applicable   Medication Review (RN Care Manager) Complete Referral to Pharmacy Complete  PCP or Specialist appointment within 3-5 days of discharge   Complete  HRI or Home Care Consult   Complete  SW Recovery Care/Counseling Consult   Complete  Palliative Care Screening   Not Applicable  Skilled Nursing Facility   Not Applicable

## 2023-06-13 NOTE — Discharge Summary (Signed)
Physician Discharge Summary  Kara Mullins NWG:[id number] DOB: 24-Sep-1951 DOA: 06/11/2023  PCP: Caesar Bookman, NP  Admit date: 06/11/2023 Discharge date: 06/13/2023  Admitted From: Home Disposition: Home with home health  Recommendations for Outpatient Follow-up:  Dialysis as scheduled tomorrow  Home Health: PT/RN Equipment/Devices: Available at home  Discharge Condition: Stable CODE STATUS: Full code Diet recommendation: Low-salt diet, fluid restrictions  Discharge summary: 72 year old ESRD on hemodialysis compliant, hypertension hyperlipidemia, combined systolic and diastolic heart failure, anemia of chronic disease, ambulatory dysfunction presented to the emergency room with elevated blood pressure, shortness of breath even after completing hemodialysis the day before admission. She does have chronic bilateral lower leg swelling which is rather improving now. On admission, she was found to have blood pressure 214/102, 98% on room air. Admitted, underwent urgent hemodialysis with clinical improvement. Chest x-ray with bilateral pulmonary edema and effusion.  Right-sided thoracentesis with more than 1 L transudate removed.  Cultures negative so far.  Overall with clinical improvement.   Patient does have left-sided pleural effusion.  With transudative findings on the right side and patient with clinical stability, she will not have any benefit with thoracentesis.  She will brother benefit with more fluid removal with dialysis.  Fluid overload secondary to ineffective hemodialysis: Dry weight threshold reduced.  Stabilized today.  Nephrology is working on achieving fluid balance with hemodialysis.  Next dialysis tomorrow at outpatient center. Patient to continue diuretics as she still makes urine.  Continue home medications including added calcitriol by nephrology.  Stable for discharge.   Discharge Diagnoses:  Principal Problem:   Acute exacerbation of CHF (congestive heart failure)  (HCC) Active Problems:   Hypertensive urgency   ESRD on dialysis [facility])   Essential hypertension   Chronic anemia   Chronic idiopathic thrombocytopenia (HCC)    Discharge Instructions  Discharge Instructions     Diet - low sodium heart healthy   Complete by: As directed    Increase activity slowly   Complete by: As directed       Allergies as of 06/13/2023       Reactions   Crestor [rosuvastatin] Shortness Of Breath   Nitrofuran Derivatives Other (See Comments)   Unknown reaction        Medication List     STOP taking these medications    hydrALAZINE 25 MG tablet Commonly known as: APRESOLINE       TAKE these medications    calcitRIOL 0.25 MCG capsule Commonly known as: ROCALTROL Take 3 capsules (0.75 mcg total) by mouth Every Tuesday,Thursday,and Saturday with dialysis.   carvedilol 6.25 MG tablet Commonly known as: COREG Take 0.5 tablets (3.125 mg total) by mouth 2 (two) times daily with a meal. Notes to patient: This is your blood pressure medication   feeding supplement Liqd Take 237 mLs by mouth 2 (two) times daily between meals.   folic acid 1 MG tablet Commonly known as: FOLVITE Take 1 tablet by mouth daily.   furosemide 80 MG tablet Commonly known as: LASIX Take 1 tablet (80 mg total) by mouth every Monday, Wednesday, Friday and Sunday. Notes to patient: This is a diuretic   isosorbide mononitrate 30 MG 24 hr tablet Commonly known as: IMDUR Take 1 tablet (30 mg total) by mouth daily. Notes to patient: This medication helps your heart pump.   multivitamin Tabs tablet Take 1 tablet by mouth daily.   Muscle Rub 10-15 % Crea Apply 1 Application topically daily as needed for muscle pain.   sevelamer carbonate 800  MG tablet Commonly known as: RENVELA Take 800 mg by mouth 3 (three) times daily with meals. Notes to patient: This medication lowers the amount of phosphorus in your blood.        Allergies  Allergen Reactions   Crestor  [Rosuvastatin] Shortness Of Breath   Nitrofuran Derivatives Other (See Comments)    Unknown reaction    Consultations: Nephrology   Procedures/Studies: ECHOCARDIOGRAM LIMITED  Result Date: 06/12/2023    ECHOCARDIOGRAM LIMITED REPORT   Patient Name:   Kara Mullins Date of Exam: 06/12/2023 Medical Rec #:  [id number]      Height:       65.0 in Accession #:    [id number]     Weight:       164.0 lb Date of Birth:  Dec 30, 1950     BSA:          1.818 m Patient Age:    71 years       BP:           137/66 mmHg Patient Gender: F              HR:           62 bpm. Exam Location:  Inpatient Procedure: Limited Echo, Color Doppler and Cardiac Doppler Indications:    I50.21 Acute systolic (congestive) heart failure  History:        Patient has prior history of Echocardiogram examinations, most                 recent 03/23/2023. CHF; Risk Factors:Hypertension.  Sonographer:    Irving Burton Senior RDCS Referring Phys: [phone] SUBRINA SUNDIL IMPRESSIONS  1. Left ventricular ejection fraction, by estimation, is 55%. The left ventricle has normal function. There is mild left ventricular hypertrophy.  2. Right ventricular systolic function is normal. The right ventricular size is mildly enlarged. Tricuspid regurgitation signal is inadequate for assessing PA pressure.  3. Large pleural effusion in the left lateral region.  4. The mitral valve is degenerative. Mild mitral valve regurgitation. Moderate to severe mitral annular calcification.  5. The aortic valve is tricuspid. There is mild calcification of the aortic valve. Aortic valve regurgitation is trivial. Aortic valve sclerosis is present, with no evidence of aortic valve stenosis.  6. The inferior vena cava is normal in size with <50% respiratory variability, suggesting right atrial pressure of 8 mmHg. FINDINGS  Left Ventricle: Left ventricular ejection fraction, by estimation, is 55%. The left ventricle has normal function. There is mild left ventricular hypertrophy. Right  Ventricle: The right ventricular size is mildly enlarged. Right ventricular systolic function is normal. Tricuspid regurgitation signal is inadequate for assessing PA pressure. Pericardium: Trivial pericardial effusion is present. Mitral Valve: The mitral valve is degenerative in appearance. Moderate to severe mitral annular calcification. Mild mitral valve regurgitation. Tricuspid Valve: Tricuspid valve regurgitation is trivial. Aortic Valve: The aortic valve is tricuspid. There is mild calcification of the aortic valve. Aortic valve regurgitation is trivial. Aortic valve sclerosis is present, with no evidence of aortic valve stenosis. Venous: The inferior vena cava is normal in size with less than 50% respiratory variability, suggesting right atrial pressure of 8 mmHg. Additional Comments: There is a large pleural effusion in the left lateral region. Spectral Doppler performed. Color Doppler performed.   LV Volumes (MOD) LV vol d, MOD A2C: 78.0 ml LV vol d, MOD A4C: 100.0 ml LV vol s, MOD A2C: 35.3 ml LV vol s, MOD A4C: 36.0 ml LV SV MOD A2C:  42.7 ml LV SV MOD A4C:     100.0 ml LV SV MOD BP:      52.8 ml AORTIC VALVE LVOT Vmax:   121.00 cm/s LVOT Vmean:  77.600 cm/s LVOT VTI:    0.283 m  SHUNTS Systemic VTI: 0.28 m Weston Brass MD Electronically signed by Weston Brass MD Signature Date/Time: 06/12/2023/5:03:56 PM    Final    DG Chest 1 View  Result Date: 06/12/2023 CLINICAL DATA:  [id number] Pleural effusion on right [id number] EXAM: PORTABLE CHEST 1 VIEW COMPARISON:  IR ultrasound, earlier same day. Chest XR, 06/11/2023. CT chest, 04/22/2023. FINDINGS: Support lines: RIGHT chest tunneled hemodialysis catheter, with tip within the proximal RIGHT atrium. The cardiac apex is obscured. Improved aeration of the RIGHT lung with resolution of known prior RIGHT pleural effusion. No pneumothorax. Small-to-moderate volume LEFT pleural effusion, with associated consolidation. No interval osseous abnormality.  IMPRESSION: 1. No pneumothorax or residual pleural effusion post RIGHT thoracentesis. 2. Small-to-moderate volume LEFT pleural effusion, with adjacent basilar consolidation, likely atelectasis. Electronically Signed   By: Roanna Banning M.D.   On: 06/12/2023 15:09   IR THORACENTESIS ASP PLEURAL SPACE W/IMG GUIDE  Result Date: 06/12/2023 INDICATION: End-stage renal disease on hemodialysis and congestive heart failure with right pleural effusion. Request for diagnostic and therapeutic thoracentesis. EXAM: ULTRASOUND GUIDED RIGHT THORACENTESIS MEDICATIONS: 1% lidocaine 10 mL COMPLICATIONS: None immediate. PROCEDURE: An ultrasound guided thoracentesis was thoroughly discussed with the patient and questions answered. The benefits, risks, alternatives and complications were also discussed. The patient understands and wishes to proceed with the procedure. Written consent was obtained. Ultrasound was performed to localize and mark an adequate pocket of fluid in the right chest. The area was then prepped and draped in the normal sterile fashion. 1% Lidocaine was used for local anesthesia. Under ultrasound guidance a 6 Fr Safe-T-Centesis catheter was introduced. Thoracentesis was performed. The catheter was removed and a dressing applied. FINDINGS: A total of approximately 1 L of blood-tinged fluid was removed. Samples were sent to the laboratory as requested by the clinical team. IMPRESSION: Successful ultrasound guided diagnostic and therapeutic RIGHT thoracentesis yielding 1 L of pleural fluid. Procedure performed by: Corrin Parker, [name] Electronically Signed   By: Roanna Banning M.D.   On: 06/12/2023 15:06   DG Chest 2 View  Result Date: 06/11/2023 CLINICAL DATA:  Chest pain and shortness of breath. EXAM: CHEST - 2 VIEW COMPARISON:  03/30/2023. FINDINGS: The heart is enlarged and the mediastinal contour is within normal limits. There is atherosclerotic calcification of the aorta. A right internal jugular central venous  catheter appear stable. The pulmonary vasculature is distended. There are moderate bilateral pleural effusions with airspace disease at the lung bases. No pneumothorax. No acute osseous abnormality. IMPRESSION: 1. Cardiomegaly with pulmonary vascular congestion. 2. Moderate bilateral pleural effusions with edema, atelectasis, or infiltrate. Electronically Signed   By: Thornell Sartorius M.D.   On: 06/11/2023 21:15   (Echo, Carotid, EGD, Colonoscopy, ERCP)    Subjective: Seen and examined.  Denies any complaints.  Denies any shortness of breath.  She does not walk at home.  Leg swelling is persistent but much improved than before.   Discharge Exam: Vitals:   06/13/23 0400 06/13/23 0809  BP: (!) 102/51 128/70  Pulse: 61 70  Resp: 15 20  Temp: 98 F (36.7 C) 97.8 F (36.6 C)  SpO2: 96% 96%   Vitals:   06/13/23 0026 06/13/23 0400 06/13/23 0621 06/13/23 0809  BP: (!) 108/57 (!) 102/51  128/70  Pulse: 67 61  70  Resp: 17 15  20   Temp: 98.1 F (36.7 C) 98 F (36.7 C)  97.8 F (36.6 C)  TempSrc: Oral Oral  Oral  SpO2: 96% 96%  96%  Weight:   69.6 kg   Height:        General: Pt is alert, awake, not in acute distress Cardiovascular: RRR, S1/S2 +, no rubs, no gallops Respiratory: Poor air entry at bases.  Right IJ permacath present. Abdominal: Soft, NT, ND, bowel sounds + Extremities: 2+ bilateral edema.    The results of significant diagnostics from this hospitalization (including imaging, microbiology, ancillary and laboratory) are listed below for reference.     Microbiology: Recent Results (from the past 240 hour(s))  Resp panel by RT-PCR (RSV, Flu A&B, Covid) Anterior Nasal Swab     Status: None   Collection Time: 06/11/23  9:45 PM   Specimen: Anterior Nasal Swab  Result Value Ref Range Status   SARS Coronavirus 2 by RT PCR NEGATIVE NEGATIVE Final   Influenza A by PCR NEGATIVE NEGATIVE Final   Influenza B by PCR NEGATIVE NEGATIVE Final    Comment: (NOTE) The Xpert Xpress  SARS-CoV-2/FLU/RSV plus assay is intended as an aid in the diagnosis of influenza from Nasopharyngeal swab specimens and should not be used as a sole basis for treatment. Nasal washings and aspirates are unacceptable for Xpert Xpress SARS-CoV-2/FLU/RSV testing.  Fact Sheet for Patients: BloggerCourse.com  Fact Sheet for Healthcare Providers: SeriousBroker.it  This test is not yet approved or cleared by the [place] FDA and has been authorized for detection and/or diagnosis of SARS-CoV-2 by FDA under an Emergency Use Authorization (EUA). This EUA will remain in effect (meaning this test can be used) for the duration of the COVID-19 declaration under Section 564(b)(1) of the Act, 21 U.S.C. section 360bbb-3(b)(1), unless the authorization is terminated or revoked.     Resp Syncytial Virus by PCR NEGATIVE NEGATIVE Final    Comment: (NOTE) Fact Sheet for Patients: BloggerCourse.com  Fact Sheet for Healthcare Providers: SeriousBroker.it  This test is not yet approved or cleared by the [place] FDA and has been authorized for detection and/or diagnosis of SARS-CoV-2 by FDA under an Emergency Use Authorization (EUA). This EUA will remain in effect (meaning this test can be used) for the duration of the COVID-19 declaration under Section 564(b)(1) of the Act, 21 U.S.C. section 360bbb-3(b)(1), unless the authorization is terminated or revoked.  Performed at [facility], [address] [place], [place] [address]   Body fluid culture w Gram Stain     Status: None (Preliminary result)   Collection Time: 06/12/23  2:36 PM   Specimen: Lung, Right Lower Lobe; Pleural Fluid  Result Value Ref Range Status   Specimen Description PLEURAL  Final   Special Requests LUNG RLL  Final   Gram Stain   Final    FEW WBC PRESENT, PREDOMINANTLY MONONUCLEAR NO ORGANISMS SEEN Performed  at [facility], [address] [place], [place] [address]    Culture PENDING  Incomplete   Report Status PENDING  Incomplete  MRSA Next Gen by PCR, Nasal     Status: None   Collection Time: 06/12/23  4:58 PM   Specimen: Nasal Mucosa; Nasal Swab  Result Value Ref Range Status   MRSA by PCR Next Gen NOT DETECTED NOT DETECTED Final    Comment: (NOTE) The GeneXpert MRSA Assay (FDA approved for NASAL specimens only), is one component of a  comprehensive MRSA colonization surveillance program. It is not intended to diagnose MRSA infection nor to guide or monitor treatment for MRSA infections. Test performance is not FDA approved in patients less than 41 years old. Performed at [facility], [address] [place], [place] [address]      Labs: BNP (last 3 results) Recent Labs    02/25/23 1226 03/22/23 0050 06/11/23 2145  BNP 1,037.5* 715.1* 3,228.3*   Basic Metabolic Panel: Recent Labs  Lab 06/11/23 2038 06/12/23 0057 06/13/23 0231  NA 139 139 135  K 3.8 3.6 3.6  CL 102 100 97*  CO2 27 24 28   GLUCOSE 70 60* 65*  BUN 28* 27* 20  CREATININE 2.86* 2.81* 2.48*  CALCIUM 8.7* 8.7* 8.1*   Liver Function Tests: Recent Labs  Lab 06/12/23 0057 06/13/23 0231  AST 19 25  ALT 13 16  ALKPHOS 108 100  BILITOT 0.6 0.8  PROT 7.0 6.5  ALBUMIN 2.8* 2.4*   No results for input(s): "LIPASE", "AMYLASE" in the last 168 hours. No results for input(s): "AMMONIA" in the last 168 hours. CBC: Recent Labs  Lab 06/11/23 2038 06/12/23 0057 06/13/23 0231  WBC 2.7* 2.8* 2.7*  HGB 11.4* 11.0* 10.1*  HCT 38.2 36.9 33.7*  MCV 89.7 91.6 88.7  PLT 111* 104* 105*   Cardiac Enzymes: No results for input(s): "CKTOTAL", "CKMB", "CKMBINDEX", "TROPONINI" in the last 168 hours. BNP: Invalid input(s): "POCBNP" CBG: Recent Labs  Lab 06/13/23 0015 06/13/23 0057 06/13/23 0149 06/13/23 0425 06/13/23 0808  GLUCAP 62* 62* 74 73 83   D-Dimer No results for input(s): "DDIMER" in  the last 72 hours. Hgb A1c No results for input(s): "HGBA1C" in the last 72 hours. Lipid Profile No results for input(s): "CHOL", "HDL", "LDLCALC", "TRIG", "CHOLHDL", "LDLDIRECT" in the last 72 hours. Thyroid function studies No results for input(s): "TSH", "T4TOTAL", "T3FREE", "THYROIDAB" in the last 72 hours.  Invalid input(s): "FREET3" Anemia work up Recent Labs    06/12/23 0050  VITAMINB12 838  FOLATE 15.2  FERRITIN 273  TIBC 192*  IRON 39  RETICCTPCT 3.7*   Urinalysis    Component Value Date/Time   COLORURINE YELLOW (A) 10/04/2022 1151   APPEARANCEUR TURBID (A) 10/04/2022 1151   LABSPEC 1.012 10/04/2022 1151   PHURINE 8.0 10/04/2022 1151   GLUCOSEU NEGATIVE 10/04/2022 1151   HGBUR NEGATIVE 10/04/2022 1151   BILIRUBINUR Negative 12/05/2022 1454   KETONESUR NEGATIVE 10/04/2022 1151   PROTEINUR Positive (A) 12/05/2022 1454   PROTEINUR >=300 (A) 10/04/2022 1151   UROBILINOGEN 0.2 12/05/2022 1454   UROBILINOGEN 0.2 11/19/2019 2004   NITRITE Negative 12/05/2022 1454   NITRITE NEGATIVE 10/04/2022 1151   LEUKOCYTESUR Moderate (2+) (A) 12/05/2022 1454   LEUKOCYTESUR LARGE (A) 10/04/2022 1151   Sepsis Labs Recent Labs  Lab 06/11/23 2038 06/12/23 0057 06/13/23 0231  WBC 2.7* 2.8* 2.7*   Microbiology Recent Results (from the past 240 hour(s))  Resp panel by RT-PCR (RSV, Flu A&B, Covid) Anterior Nasal Swab     Status: None   Collection Time: 06/11/23  9:45 PM   Specimen: Anterior Nasal Swab  Result Value Ref Range Status   SARS Coronavirus 2 by RT PCR NEGATIVE NEGATIVE Final   Influenza A by PCR NEGATIVE NEGATIVE Final   Influenza B by PCR NEGATIVE NEGATIVE Final    Comment: (NOTE) The Xpert Xpress SARS-CoV-2/FLU/RSV plus assay is intended as an aid in the diagnosis of influenza from Nasopharyngeal swab specimens and should not be used as a sole  basis for treatment. Nasal washings and aspirates are unacceptable for Xpert Xpress SARS-CoV-2/FLU/RSV testing.  Fact  Sheet for Patients: BloggerCourse.com  Fact Sheet for Healthcare Providers: SeriousBroker.it  This test is not yet approved or cleared by the [place] FDA and has been authorized for detection and/or diagnosis of SARS-CoV-2 by FDA under an Emergency Use Authorization (EUA). This EUA will remain in effect (meaning this test can be used) for the duration of the COVID-19 declaration under Section 564(b)(1) of the Act, 21 U.S.C. section 360bbb-3(b)(1), unless the authorization is terminated or revoked.     Resp Syncytial Virus by PCR NEGATIVE NEGATIVE Final    Comment: (NOTE) Fact Sheet for Patients: BloggerCourse.com  Fact Sheet for Healthcare Providers: SeriousBroker.it  This test is not yet approved or cleared by the [place] FDA and has been authorized for detection and/or diagnosis of SARS-CoV-2 by FDA under an Emergency Use Authorization (EUA). This EUA will remain in effect (meaning this test can be used) for the duration of the COVID-19 declaration under Section 564(b)(1) of the Act, 21 U.S.C. section 360bbb-3(b)(1), unless the authorization is terminated or revoked.  Performed at [facility], [address], [place], [place] [address]   Body fluid culture w Gram Stain     Status: None (Preliminary result)   Collection Time: 06/12/23  2:36 PM   Specimen: Lung, Right Lower Lobe; Pleural Fluid  Result Value Ref Range Status   Specimen Description PLEURAL  Final   Special Requests LUNG RLL  Final   Gram Stain   Final    FEW WBC PRESENT, PREDOMINANTLY MONONUCLEAR NO ORGANISMS SEEN Performed at [facility], [address], [place], [place] [address]    Culture PENDING  Incomplete   Report Status PENDING  Incomplete  MRSA Next Gen by PCR, Nasal     Status: None   Collection Time: 06/12/23  4:58 PM   Specimen: Nasal Mucosa; Nasal Swab  Result Value  Ref Range Status   MRSA by PCR Next Gen NOT DETECTED NOT DETECTED Final    Comment: (NOTE) The GeneXpert MRSA Assay (FDA approved for NASAL specimens only), is one component of a comprehensive MRSA colonization surveillance program. It is not intended to diagnose MRSA infection nor to guide or monitor treatment for MRSA infections. Test performance is not FDA approved in patients less than 42 years old. Performed at [facility], [address], [place], [place] [address]      Time coordinating discharge: 32 minutes  SIGNED:   Dorcas Carrow, MD  [facility] 06/13/2023, 10:41 AM

## 2023-06-13 NOTE — Progress Notes (Signed)
[facility] Progress Note  Name: Kara Mullins MRN: [id number] DOB: 1951-05-11  Chief Complaint:  Shortness of breath   Subjective:  Seen in room with daughter at bedside.  She feels much better today.  Had HD yesterday and had a thoracentesis as well with 1 liter of fluid drawn from right lung.  Her weight this AM is 69.6 kg.  (Note weight on 8/29 was pre-HD and before her thoracentesis as well).  She had 3.5 kg UF with HD on 8/29.  Strict ins/outs are not available - no UOP was charted   Review of systems:  Shortness of breath is much improved Denies n/v Denies chest pain  She still does make urine  --------------- Background on consult:  Kara Mullins is a 72 y.o. female with ESRD, HTN, HL, HFrEF who has admitted with dyspnea/volume overload.  Started on HD in 03/2023 - was severely overload at that time. Her dry weight has consistently been lowered over the past few months (81.5kg -> 73.2kg) and her edema is substantially better. Presented to ED yesterday with headache, dyspnea, malaise. BP very high when checked by her daughter. In ED, BP was 183/97. Initially placed on O2 - but then able to maintain on room air. She was afebrile. Labs with Na 139, K 3.8, BUN 28, Cr 2.86. ^ BNP, WBC 2.7, Hgb 11.4, Plts 111. Trop 4. COVID/Flu negative. CXR shows persistent pulm edema and B pleural effusions.  Our team was consulted overnight - HD orders placed. Seen while on HD this AM - chronic dyspnea, a little better than when she came in. Denies fever, chills, CP, abd pain, N/V/D, dysuria. HA resolved at this time. She is unclear why she was so symptomatic yesterday - has been trying to limit her fluids.  Dialyzes on TTS schedule at [facility] clinic - today is her usual HD day. Hasn't missed any treatments, meets her assigned dry weight (which needs to be lowered). Uses TDC as her access, has maturing LUE AVF + bruit/thrill.   Intake/Output Summary (Last 24 hours) at 06/13/2023  0657 Last data filed at 06/13/2023 0600 Gross per 24 hour  Intake 480 ml  Output 3500 ml  Net -3020 ml    Vitals:  Vitals:   06/12/23 1948 06/13/23 0026 06/13/23 0400 06/13/23 0621  BP: (!) 146/86 (!) 108/57 (!) 102/51   Pulse: 69 67 61   Resp: 19 17 15    Temp: 97.8 F (36.6 C) 98.1 F (36.7 C) 98 F (36.7 C)   TempSrc: Axillary Oral Oral   SpO2: 100% 96% 96%   Weight:    69.6 kg  Height:         Physical Exam:  General elderly female in bed in no acute distress HEENT normocephalic atraumatic extraocular movements intact sclera anicteric Neck supple trachea midline Lungs clear but reduced to auscultation on left and right lung with pleural rub normal work of breathing at rest on room air  Heart S1S2 no rub Abdomen soft nontender nondistended Extremities 1-2+ edema lower extremities Neuro - she is alert and oriented x3 provides hx and follows commands; she is an excellent historian  Psych normal mood and affect Access RIJ tunn catheter in place ; maturing LUE AVF with bruit and thrill  Medications reviewed   Labs:     Latest Ref Rng & Units 06/12/2023   12:57 AM 06/11/2023    8:38 PM 04/01/2023   12:55 AM  BMP  Glucose 70 - 99 mg/dL 60  70  87   BUN 8 - 23 mg/dL 27  28  36   Creatinine 0.44 - 1.00 mg/dL 1.61  0.96  0.45   Sodium 135 - 145 mmol/L 139  139  135   Potassium 3.5 - 5.1 mmol/L 3.6  3.8  3.3   Chloride 98 - 111 mmol/L 100  102  101   CO2 22 - 32 mmol/L 24  27  28    Calcium 8.9 - 10.3 mg/dL 8.7  8.7  7.1     Dialysis Orders:  TTS at [facility] 3:45hr, 180 dialyzer, 400/800, EDW 73.2kg -> lower on d/c, 3K/2.5Ca, TDC, no heparin - Mircera IV q 2 weeks - last 8/27 - Venofer IV weekly - Calcitriol 0.69mcg PO q HD   Assessment/Plan:   # Acute on chronic heart failure with reduced EF/fluid volume overload  - she is losing weight and we are challenging her EDW - daily weights and strict ins/outs    # ESRD - HD per TTS schedule  - as above  will need to challenge EDW.  Last weight here was 69.6 kg (the day after HD and thora) - From current standpoint would lower EDW to 68.5 and reassess   # Hypertension  - optimize volume with HD - I stopped her hydralazine to allow room for UF   # Anemia of CKD  - recently got ESA; no acute needs    # Metabolic bone disease  - check phos with AM labs if here tomorrow. On calcitriol outpatient - resumed   Disposition per primary team   Estanislado Emms, MD 06/13/2023 7:07 AM

## 2023-06-13 NOTE — Progress Notes (Signed)
Hypoglycemic Event  CBG: 62  Treatment: 4 oz juice/soda/graham crackers w/peanut butter  Symptoms: None  Follow-up CBG: 0149 Time: CBG Result:74  Possible Reasons for Event: Inadequate meal intake  Comments/MD notified:N/A    Vinette Crites D Audrionna Lampton

## 2023-06-14 ENCOUNTER — Telehealth: Payer: Self-pay | Admitting: Nephrology

## 2023-06-14 DIAGNOSIS — I129 Hypertensive chronic kidney disease with stage 1 through stage 4 chronic kidney disease, or unspecified chronic kidney disease: Secondary | ICD-10-CM | POA: Diagnosis not present

## 2023-06-14 DIAGNOSIS — N186 End stage renal disease: Secondary | ICD-10-CM | POA: Diagnosis not present

## 2023-06-14 DIAGNOSIS — Z992 Dependence on renal dialysis: Secondary | ICD-10-CM | POA: Diagnosis not present

## 2023-06-14 NOTE — Discharge Planning (Signed)
[facility] Discharge Orders- [facility] CLINIC: [place]  Patient's name: Kara Mullins Admit/DC Dates: 06/11/2023 - 06/13/2023  Discharge Diagnoses: Fluid overload/dyspnea  ESRD  Aranesp: Given: no    Last Hgb: 68.5kg PRBC's Given: no  ESA dose for discharge: no change IV Iron dose at discharge: no change  Heparin change: no  EDW Change: yes New EDW:   [place] Change: no  Access intervention/Change: no Details:  Hectorol/Calcitriol change: no  Discharge Labs: Calcium 8.1 Phosphorus - Albumin 2.4 K+ 3.6  Start ONSP if not already on  IV Antibiotics: no Details:  On Coumadin?: no Last INR: Next INR: Managed By:   OTHER/APPTS/LAB ORDERS: Hydralazine stopped.     D/C Meds to be reconciled by nurse after every discharge.  Completed By:   Reviewed by: MD:______ RN_______

## 2023-06-14 NOTE — Telephone Encounter (Signed)
Transition of Care Contact from Inpatient Facility   Date of Discharge: 06/13/23 Date of Contact: 06/14/23 Method of contact: phone Talked to patient daughter [name]   Patient contacted to discuss transition of care form recent hospitaliztion. Patient was admitted to [facility] from 8/28 to 06/13/23 with the discharge diagnosis of volume overload/acute on chronic HF.       Medication changes were reviewed.  Patient will follow up with is outpatient dialysis center today 06/14/23.  Other follow up needs include needs refill of renal vitamin.    Virgina Norfolk, [name] [employer]

## 2023-06-15 ENCOUNTER — Other Ambulatory Visit: Payer: Self-pay | Admitting: Family

## 2023-06-15 LAB — BODY FLUID CULTURE W GRAM STAIN: Culture: NO GROWTH

## 2023-06-17 ENCOUNTER — Other Ambulatory Visit: Payer: Self-pay

## 2023-06-17 DIAGNOSIS — I132 Hypertensive heart and chronic kidney disease with heart failure and with stage 5 chronic kidney disease, or end stage renal disease: Secondary | ICD-10-CM | POA: Diagnosis not present

## 2023-06-17 DIAGNOSIS — F411 Generalized anxiety disorder: Secondary | ICD-10-CM | POA: Diagnosis not present

## 2023-06-17 DIAGNOSIS — I509 Heart failure, unspecified: Secondary | ICD-10-CM | POA: Diagnosis not present

## 2023-06-17 DIAGNOSIS — D689 Coagulation defect, unspecified: Secondary | ICD-10-CM | POA: Diagnosis not present

## 2023-06-17 DIAGNOSIS — D693 Immune thrombocytopenic purpura: Secondary | ICD-10-CM | POA: Diagnosis not present

## 2023-06-17 DIAGNOSIS — I5021 Acute systolic (congestive) heart failure: Secondary | ICD-10-CM | POA: Diagnosis not present

## 2023-06-17 DIAGNOSIS — E785 Hyperlipidemia, unspecified: Secondary | ICD-10-CM | POA: Diagnosis not present

## 2023-06-17 DIAGNOSIS — Z6825 Body mass index (BMI) 25.0-25.9, adult: Secondary | ICD-10-CM | POA: Diagnosis not present

## 2023-06-17 DIAGNOSIS — I890 Lymphedema, not elsewhere classified: Secondary | ICD-10-CM | POA: Diagnosis not present

## 2023-06-17 DIAGNOSIS — A499 Bacterial infection, unspecified: Secondary | ICD-10-CM | POA: Diagnosis not present

## 2023-06-17 DIAGNOSIS — D631 Anemia in chronic kidney disease: Secondary | ICD-10-CM | POA: Diagnosis not present

## 2023-06-17 DIAGNOSIS — E668 Other obesity: Secondary | ICD-10-CM | POA: Diagnosis not present

## 2023-06-17 DIAGNOSIS — J90 Pleural effusion, not elsewhere classified: Secondary | ICD-10-CM | POA: Diagnosis not present

## 2023-06-17 DIAGNOSIS — M898X9 Other specified disorders of bone, unspecified site: Secondary | ICD-10-CM | POA: Diagnosis not present

## 2023-06-17 DIAGNOSIS — N2581 Secondary hyperparathyroidism of renal origin: Secondary | ICD-10-CM | POA: Diagnosis not present

## 2023-06-17 DIAGNOSIS — N181 Chronic kidney disease, stage 1: Secondary | ICD-10-CM | POA: Diagnosis not present

## 2023-06-17 DIAGNOSIS — N186 End stage renal disease: Secondary | ICD-10-CM | POA: Diagnosis not present

## 2023-06-17 DIAGNOSIS — Z992 Dependence on renal dialysis: Secondary | ICD-10-CM | POA: Diagnosis not present

## 2023-06-17 DIAGNOSIS — I083 Combined rheumatic disorders of mitral, aortic and tricuspid valves: Secondary | ICD-10-CM | POA: Diagnosis not present

## 2023-06-17 NOTE — Telephone Encounter (Signed)
Patient's daughter called requesting rena vit refill. Warning came up when trying to send to pharmacy.  Medication pended and sent to [name], NP

## 2023-06-19 ENCOUNTER — Encounter: Payer: Medicare (Managed Care) | Admitting: Family

## 2023-06-19 ENCOUNTER — Telehealth: Payer: Self-pay

## 2023-06-19 MED ORDER — RENA-VITE PO TABS: 1.0000 | ORAL_TABLET | Freq: Every day | ORAL | 1 refills | Status: DC

## 2023-06-19 NOTE — Telephone Encounter (Signed)
Kara Mullins with [facility] called regarding metolazone and dialysis. She states that medication is not recommended for use in patient's on dialysis. She says that patient recently reported taking medication. Medication was discontinued 03/28/23. She would like [place] to check with patient to be sure she is not taking medication.

## 2023-06-20 ENCOUNTER — Telehealth: Payer: Self-pay | Admitting: Family

## 2023-06-20 NOTE — Telephone Encounter (Signed)
FMLA paperwork completed placed on outgoing faxes box to be fax or to be pickup by POA as requested

## 2023-06-22 DIAGNOSIS — L89150 Pressure ulcer of sacral region, unstageable: Secondary | ICD-10-CM | POA: Diagnosis not present

## 2023-07-01 DIAGNOSIS — I509 Heart failure, unspecified: Secondary | ICD-10-CM | POA: Diagnosis not present

## 2023-07-01 DIAGNOSIS — D693 Immune thrombocytopenic purpura: Secondary | ICD-10-CM | POA: Diagnosis not present

## 2023-07-01 DIAGNOSIS — Z992 Dependence on renal dialysis: Secondary | ICD-10-CM | POA: Diagnosis not present

## 2023-07-01 DIAGNOSIS — I890 Lymphedema, not elsewhere classified: Secondary | ICD-10-CM | POA: Diagnosis not present

## 2023-07-01 DIAGNOSIS — N2581 Secondary hyperparathyroidism of renal origin: Secondary | ICD-10-CM | POA: Diagnosis not present

## 2023-07-01 DIAGNOSIS — E785 Hyperlipidemia, unspecified: Secondary | ICD-10-CM | POA: Diagnosis not present

## 2023-07-01 DIAGNOSIS — F411 Generalized anxiety disorder: Secondary | ICD-10-CM | POA: Diagnosis not present

## 2023-07-01 DIAGNOSIS — I083 Combined rheumatic disorders of mitral, aortic and tricuspid valves: Secondary | ICD-10-CM | POA: Diagnosis not present

## 2023-07-01 DIAGNOSIS — M898X9 Other specified disorders of bone, unspecified site: Secondary | ICD-10-CM | POA: Diagnosis not present

## 2023-07-01 DIAGNOSIS — E668 Other obesity: Secondary | ICD-10-CM | POA: Diagnosis not present

## 2023-07-01 DIAGNOSIS — J90 Pleural effusion, not elsewhere classified: Secondary | ICD-10-CM | POA: Diagnosis not present

## 2023-07-01 DIAGNOSIS — I132 Hypertensive heart and chronic kidney disease with heart failure and with stage 5 chronic kidney disease, or end stage renal disease: Secondary | ICD-10-CM | POA: Diagnosis not present

## 2023-07-01 DIAGNOSIS — Z6825 Body mass index (BMI) 25.0-25.9, adult: Secondary | ICD-10-CM | POA: Diagnosis not present

## 2023-07-01 NOTE — Progress Notes (Signed)
This encounter was created in error - please disregard.

## 2023-07-03 ENCOUNTER — Other Ambulatory Visit: Payer: Self-pay

## 2023-07-03 DIAGNOSIS — I5042 Chronic combined systolic (congestive) and diastolic (congestive) heart failure: Secondary | ICD-10-CM

## 2023-07-03 MED ORDER — ISOSORBIDE MONONITRATE ER 30 MG PO TB24
30.0000 mg | ORAL_TABLET | Freq: Every day | ORAL | 1 refills | Status: DC
Start: 2023-07-03 — End: 2023-12-23

## 2023-07-04 ENCOUNTER — Telehealth: Payer: Medicare (Managed Care)

## 2023-07-04 DIAGNOSIS — I132 Hypertensive heart and chronic kidney disease with heart failure and with stage 5 chronic kidney disease, or end stage renal disease: Secondary | ICD-10-CM | POA: Diagnosis not present

## 2023-07-04 DIAGNOSIS — I890 Lymphedema, not elsewhere classified: Secondary | ICD-10-CM | POA: Diagnosis not present

## 2023-07-04 DIAGNOSIS — N2581 Secondary hyperparathyroidism of renal origin: Secondary | ICD-10-CM | POA: Diagnosis not present

## 2023-07-04 DIAGNOSIS — F411 Generalized anxiety disorder: Secondary | ICD-10-CM | POA: Diagnosis not present

## 2023-07-04 DIAGNOSIS — J90 Pleural effusion, not elsewhere classified: Secondary | ICD-10-CM | POA: Diagnosis not present

## 2023-07-04 DIAGNOSIS — E785 Hyperlipidemia, unspecified: Secondary | ICD-10-CM | POA: Diagnosis not present

## 2023-07-04 DIAGNOSIS — Z6825 Body mass index (BMI) 25.0-25.9, adult: Secondary | ICD-10-CM | POA: Diagnosis not present

## 2023-07-04 DIAGNOSIS — M898X9 Other specified disorders of bone, unspecified site: Secondary | ICD-10-CM | POA: Diagnosis not present

## 2023-07-04 DIAGNOSIS — E668 Other obesity: Secondary | ICD-10-CM | POA: Diagnosis not present

## 2023-07-04 DIAGNOSIS — D693 Immune thrombocytopenic purpura: Secondary | ICD-10-CM | POA: Diagnosis not present

## 2023-07-04 DIAGNOSIS — Z992 Dependence on renal dialysis: Secondary | ICD-10-CM | POA: Diagnosis not present

## 2023-07-04 DIAGNOSIS — I509 Heart failure, unspecified: Secondary | ICD-10-CM | POA: Diagnosis not present

## 2023-07-04 DIAGNOSIS — I083 Combined rheumatic disorders of mitral, aortic and tricuspid valves: Secondary | ICD-10-CM | POA: Diagnosis not present

## 2023-07-04 NOTE — Telephone Encounter (Signed)
Kara Mullins PT from [facility] called and states that she wants to extend patient care. She wants 1 week 7 to work on [employer]. Verbal orders given on voicemail. Message routed to provider as Lorain Childes.

## 2023-07-11 DIAGNOSIS — D693 Immune thrombocytopenic purpura: Secondary | ICD-10-CM | POA: Diagnosis not present

## 2023-07-11 DIAGNOSIS — I509 Heart failure, unspecified: Secondary | ICD-10-CM | POA: Diagnosis not present

## 2023-07-11 DIAGNOSIS — N2581 Secondary hyperparathyroidism of renal origin: Secondary | ICD-10-CM | POA: Diagnosis not present

## 2023-07-11 DIAGNOSIS — I132 Hypertensive heart and chronic kidney disease with heart failure and with stage 5 chronic kidney disease, or end stage renal disease: Secondary | ICD-10-CM | POA: Diagnosis not present

## 2023-07-11 DIAGNOSIS — J90 Pleural effusion, not elsewhere classified: Secondary | ICD-10-CM | POA: Diagnosis not present

## 2023-07-11 DIAGNOSIS — Z992 Dependence on renal dialysis: Secondary | ICD-10-CM | POA: Diagnosis not present

## 2023-07-11 DIAGNOSIS — Z6825 Body mass index (BMI) 25.0-25.9, adult: Secondary | ICD-10-CM | POA: Diagnosis not present

## 2023-07-11 DIAGNOSIS — E668 Other obesity: Secondary | ICD-10-CM | POA: Diagnosis not present

## 2023-07-11 DIAGNOSIS — E785 Hyperlipidemia, unspecified: Secondary | ICD-10-CM | POA: Diagnosis not present

## 2023-07-11 DIAGNOSIS — I083 Combined rheumatic disorders of mitral, aortic and tricuspid valves: Secondary | ICD-10-CM | POA: Diagnosis not present

## 2023-07-11 DIAGNOSIS — M898X9 Other specified disorders of bone, unspecified site: Secondary | ICD-10-CM | POA: Diagnosis not present

## 2023-07-11 DIAGNOSIS — I890 Lymphedema, not elsewhere classified: Secondary | ICD-10-CM | POA: Diagnosis not present

## 2023-07-11 DIAGNOSIS — F411 Generalized anxiety disorder: Secondary | ICD-10-CM | POA: Diagnosis not present

## 2023-07-14 DIAGNOSIS — I129 Hypertensive chronic kidney disease with stage 1 through stage 4 chronic kidney disease, or unspecified chronic kidney disease: Secondary | ICD-10-CM | POA: Diagnosis not present

## 2023-07-14 DIAGNOSIS — Z992 Dependence on renal dialysis: Secondary | ICD-10-CM | POA: Diagnosis not present

## 2023-07-14 DIAGNOSIS — N186 End stage renal disease: Secondary | ICD-10-CM | POA: Diagnosis not present

## 2023-07-15 DIAGNOSIS — E785 Hyperlipidemia, unspecified: Secondary | ICD-10-CM | POA: Diagnosis not present

## 2023-07-15 DIAGNOSIS — D509 Iron deficiency anemia, unspecified: Secondary | ICD-10-CM | POA: Diagnosis not present

## 2023-07-15 DIAGNOSIS — D693 Immune thrombocytopenic purpura: Secondary | ICD-10-CM | POA: Diagnosis not present

## 2023-07-15 DIAGNOSIS — M898X9 Other specified disorders of bone, unspecified site: Secondary | ICD-10-CM | POA: Diagnosis not present

## 2023-07-15 DIAGNOSIS — A499 Bacterial infection, unspecified: Secondary | ICD-10-CM | POA: Diagnosis not present

## 2023-07-15 DIAGNOSIS — Z992 Dependence on renal dialysis: Secondary | ICD-10-CM | POA: Diagnosis not present

## 2023-07-15 DIAGNOSIS — N2581 Secondary hyperparathyroidism of renal origin: Secondary | ICD-10-CM | POA: Diagnosis not present

## 2023-07-15 DIAGNOSIS — J90 Pleural effusion, not elsewhere classified: Secondary | ICD-10-CM | POA: Diagnosis not present

## 2023-07-15 DIAGNOSIS — I083 Combined rheumatic disorders of mitral, aortic and tricuspid valves: Secondary | ICD-10-CM | POA: Diagnosis not present

## 2023-07-15 DIAGNOSIS — I509 Heart failure, unspecified: Secondary | ICD-10-CM | POA: Diagnosis not present

## 2023-07-15 DIAGNOSIS — Z23 Encounter for immunization: Secondary | ICD-10-CM | POA: Diagnosis not present

## 2023-07-15 DIAGNOSIS — I890 Lymphedema, not elsewhere classified: Secondary | ICD-10-CM | POA: Diagnosis not present

## 2023-07-15 DIAGNOSIS — N186 End stage renal disease: Secondary | ICD-10-CM | POA: Diagnosis not present

## 2023-07-15 DIAGNOSIS — T8249XA Other complication of vascular dialysis catheter, initial encounter: Secondary | ICD-10-CM | POA: Diagnosis not present

## 2023-07-15 DIAGNOSIS — Z6825 Body mass index (BMI) 25.0-25.9, adult: Secondary | ICD-10-CM | POA: Diagnosis not present

## 2023-07-15 DIAGNOSIS — N181 Chronic kidney disease, stage 1: Secondary | ICD-10-CM | POA: Diagnosis not present

## 2023-07-15 DIAGNOSIS — D689 Coagulation defect, unspecified: Secondary | ICD-10-CM | POA: Diagnosis not present

## 2023-07-15 DIAGNOSIS — I132 Hypertensive heart and chronic kidney disease with heart failure and with stage 5 chronic kidney disease, or end stage renal disease: Secondary | ICD-10-CM | POA: Diagnosis not present

## 2023-07-15 DIAGNOSIS — F411 Generalized anxiety disorder: Secondary | ICD-10-CM | POA: Diagnosis not present

## 2023-07-15 DIAGNOSIS — D631 Anemia in chronic kidney disease: Secondary | ICD-10-CM | POA: Diagnosis not present

## 2023-07-15 DIAGNOSIS — L89150 Pressure ulcer of sacral region, unstageable: Secondary | ICD-10-CM | POA: Diagnosis not present

## 2023-07-17 DIAGNOSIS — L89150 Pressure ulcer of sacral region, unstageable: Secondary | ICD-10-CM | POA: Diagnosis not present

## 2023-07-17 DIAGNOSIS — N2581 Secondary hyperparathyroidism of renal origin: Secondary | ICD-10-CM | POA: Diagnosis not present

## 2023-07-17 DIAGNOSIS — Z6825 Body mass index (BMI) 25.0-25.9, adult: Secondary | ICD-10-CM | POA: Diagnosis not present

## 2023-07-17 DIAGNOSIS — Z992 Dependence on renal dialysis: Secondary | ICD-10-CM | POA: Diagnosis not present

## 2023-07-17 DIAGNOSIS — I890 Lymphedema, not elsewhere classified: Secondary | ICD-10-CM | POA: Diagnosis not present

## 2023-07-17 DIAGNOSIS — I083 Combined rheumatic disorders of mitral, aortic and tricuspid valves: Secondary | ICD-10-CM | POA: Diagnosis not present

## 2023-07-17 DIAGNOSIS — F411 Generalized anxiety disorder: Secondary | ICD-10-CM | POA: Diagnosis not present

## 2023-07-17 DIAGNOSIS — J90 Pleural effusion, not elsewhere classified: Secondary | ICD-10-CM | POA: Diagnosis not present

## 2023-07-17 DIAGNOSIS — I509 Heart failure, unspecified: Secondary | ICD-10-CM | POA: Diagnosis not present

## 2023-07-17 DIAGNOSIS — E785 Hyperlipidemia, unspecified: Secondary | ICD-10-CM | POA: Diagnosis not present

## 2023-07-17 DIAGNOSIS — M898X9 Other specified disorders of bone, unspecified site: Secondary | ICD-10-CM | POA: Diagnosis not present

## 2023-07-17 DIAGNOSIS — I132 Hypertensive heart and chronic kidney disease with heart failure and with stage 5 chronic kidney disease, or end stage renal disease: Secondary | ICD-10-CM | POA: Diagnosis not present

## 2023-07-17 DIAGNOSIS — D693 Immune thrombocytopenic purpura: Secondary | ICD-10-CM | POA: Diagnosis not present

## 2023-07-28 ENCOUNTER — Telehealth: Payer: Self-pay | Admitting: *Deleted

## 2023-07-28 DIAGNOSIS — R2681 Unsteadiness on feet: Secondary | ICD-10-CM

## 2023-07-28 DIAGNOSIS — R5381 Other malaise: Secondary | ICD-10-CM

## 2023-07-28 NOTE — Telephone Encounter (Signed)
Patient daughter called with some request:  1)Numbing cream to use on arm with dialysis  2)An order for a [employer] PAD (ONLY the PAD)  Please Advise.

## 2023-07-29 NOTE — Telephone Encounter (Signed)
And the Numbing Cream?

## 2023-07-29 NOTE — Progress Notes (Deleted)
Cardiology Office Note    Date:  07/29/2023  ID:  Kara Mullins, DOB Apr 20, 1951, MRN [id number] PCP:  Caesar Bookman, NP  Cardiologist:  Rip Harbour, NP  Electrophysiologist:  None   Chief Complaint: ***  History of Present Illness: .    Kara Mullins is a 72 y.o. female with visit-pertinent history of hypertension, hyperlipidemia, chronic lymphedema, CKD stage IV and chronic combined systolic and diastolic heart failure with improved EF.   Kara Mullins was admitted to [facility] on 03/05/2021 with shortness of breath.  Echocardiogram showed LVEF 45%, severe LVH, concerning for cardiac amyloid.  For amyloidosis showed abnormal light chains, hematology was consulted underwent bone marrow biopsy.  Her course is complicated by AKI, thought to be secondary to contrast nephropathy.  She was consulted and she was diuresed with IV Lasix with improvement in her kidney function.  Was discharged on oral Lasix 40 mg daily.  04/30/2021 she underwent cardiac MRI that showed no evidence of cardiac amyloidosis, moderate asymmetric LVH not meeting HCM criteria, LVEF 40 to 53%, RVEF 57%, no LGE.   February 2023 she was admitted with an AKI.  Baseline creatinine 1.36-1.6, was up to 4.7.  Discharge her creatinine was 4.3 allergy was consulted, stated not a good candidate for hemodialysis or aggressive intervention.  Nephrology felt that her AKI was likely secondary to ATN but could not rule out plasma cell dyscrasia.  She was found to have a UTI and completed a 7-day course of meropenem.  In setting of AKI her Lasix, losartan and spironolactone were discontinued.  She was discharged on as needed Lasix and palliative care was consulted.     On 02/26/2023 she was admitted for acute CHF exacerbation, she recently received a blood transfusion and reported feeling significantly worse since.  She had been on p.o. torsemide that in setting of worsening renal function.  He reported worsening lower extremity edema and  shortness of breath pressure was elevated at 186/96.  Creatinine had increased to 3.91, BNP 1037, troponin 23>>38, hemoglobin 10.3.  Chest x-ray indicated bilateral pleural effusions, left greater than right and cardiomegaly with vascular congestion.  Echocardiogram at that time demonstrated EF 50 to 55%, severe LVH, large pleural effusion mild MR.  Nephrology was consulted and diuresis was deferred to their control.  She was discharged on on 03/03/23 torsemide twice daily.   On 03/21/2023 she noted increasing dyspnea, presented to [facility] ED. Initial blood work showed a creatinine of 3.88 serial troponin was negative.  BNP was 715, hemoglobin 11.2 chest x-ray showed stable moderate to marked bilateral airspace disease with stable small to moderate size bilateral pleural effusions.  Echo on 03/23/2023 indicated LVEF of 45 to 50%, LV with mildly decreased function and global hypokinesis, moderately elevated pulmonary artery systolic pressure, large pleural effusion in the left lateral region, mild mitral valve regurgitation.  Nephrology was consulted she was started on dialysis. Vascular surgery placed an AV fistual on 6/13. She was discharged on 04/01/23, HD T/Th/Sat, nephrology recommended lasix 80mg  daily on M/W/F/Su. On 04/21/23 she presented for hospital follow up, she had remained stable from a cardiac perspective. Her blood pressure was elevated at 158/68, however she had only taken her hydralazine that morning.    On 06/11/2023 she presented to [facility] ED for hypertension and shortness of breath.  She had missed a dose of Lasix 2 days prior to the visit, blood pressure in the ED was 189/84.  Her BNP was elevated at 3200, [employer]  K showed bilateral pulmonary edema and effusion.  She underwent right-sided thoracentesis with more than 1 L transudate removed.  She had fluid overload secondary to ineffective hemodialysis, her dry weight threshold was reduced.  Today she presents for follow up. She reports she    Labwork independently reviewed:   ROS: .    Please see the history of present illness. Otherwise, review of systems is positive for ***.  All other systems are reviewed and otherwise negative.  Studies Reviewed: Marland Kitchen    EKG:  EKG is ordered today, personally reviewed, demonstrating ***  CV Studies: Cardiac studies reviewed are outlined and summarized above. Otherwise please see EMR for full report.   Current Reported Medications:.    No outpatient medications have been marked as taking for the 07/30/23 encounter (Appointment) with Joylene Grapes, NP.    Physical Exam:    VS:  There were no vitals taken for this visit.   Wt Readings from Last 3 Encounters:  06/13/23 153 lb 7 oz (69.6 kg)  05/13/23 163 lb 8 oz (74.2 kg)  04/21/23 176 lb (79.8 kg)    GEN: Well nourished, well developed in no acute distress NECK: No JVD; No carotid bruits CARDIAC: ***RRR, no murmurs, rubs, gallops RESPIRATORY:  Clear to auscultation without rales, wheezing or rhonchi  ABDOMEN: Soft, non-tender, non-distended EXTREMITIES:  No edema; No acute deformity   Asessement and Plan:.     ***     Disposition: F/u with ***  Signed, Rip Harbour, NP

## 2023-07-30 ENCOUNTER — Ambulatory Visit: Payer: Medicare (Managed Care) | Admitting: Nurse Practitioner

## 2023-07-30 NOTE — Telephone Encounter (Signed)
Per [name]: "what is the name of the numbning gel ? we have the ethyl spray not gel.I know oncology usually uses it "   Tried calling Daughter, LMOM to return call.

## 2023-07-31 DIAGNOSIS — L89150 Pressure ulcer of sacral region, unstageable: Secondary | ICD-10-CM | POA: Diagnosis not present

## 2023-07-31 DIAGNOSIS — I083 Combined rheumatic disorders of mitral, aortic and tricuspid valves: Secondary | ICD-10-CM | POA: Diagnosis not present

## 2023-07-31 DIAGNOSIS — F411 Generalized anxiety disorder: Secondary | ICD-10-CM | POA: Diagnosis not present

## 2023-07-31 DIAGNOSIS — E785 Hyperlipidemia, unspecified: Secondary | ICD-10-CM | POA: Diagnosis not present

## 2023-07-31 DIAGNOSIS — I509 Heart failure, unspecified: Secondary | ICD-10-CM | POA: Diagnosis not present

## 2023-07-31 DIAGNOSIS — D693 Immune thrombocytopenic purpura: Secondary | ICD-10-CM | POA: Diagnosis not present

## 2023-07-31 DIAGNOSIS — M898X9 Other specified disorders of bone, unspecified site: Secondary | ICD-10-CM | POA: Diagnosis not present

## 2023-07-31 DIAGNOSIS — Z992 Dependence on renal dialysis: Secondary | ICD-10-CM | POA: Diagnosis not present

## 2023-07-31 DIAGNOSIS — J90 Pleural effusion, not elsewhere classified: Secondary | ICD-10-CM | POA: Diagnosis not present

## 2023-07-31 DIAGNOSIS — I132 Hypertensive heart and chronic kidney disease with heart failure and with stage 5 chronic kidney disease, or end stage renal disease: Secondary | ICD-10-CM | POA: Diagnosis not present

## 2023-07-31 DIAGNOSIS — Z6825 Body mass index (BMI) 25.0-25.9, adult: Secondary | ICD-10-CM | POA: Diagnosis not present

## 2023-07-31 DIAGNOSIS — I890 Lymphedema, not elsewhere classified: Secondary | ICD-10-CM | POA: Diagnosis not present

## 2023-07-31 DIAGNOSIS — N2581 Secondary hyperparathyroidism of renal origin: Secondary | ICD-10-CM | POA: Diagnosis not present

## 2023-07-31 NOTE — Telephone Encounter (Addendum)
Spoke with patients daughter and she is not aware of the name of cream, would like  it sent to [facility]. [name] states she had used a lidocaine cream before but she thinks she needs something stronger.   Current weight: 137 lbs, Height 5 5, if needed for the Lift Pad order

## 2023-08-01 ENCOUNTER — Ambulatory Visit: Payer: Self-pay

## 2023-08-01 ENCOUNTER — Other Ambulatory Visit: Payer: Self-pay | Admitting: Family

## 2023-08-01 DIAGNOSIS — N186 End stage renal disease: Secondary | ICD-10-CM

## 2023-08-01 MED ORDER — LIDOCAINE-PRILOCAINE 2.5-2.5 % EX CREA: 1.0000 | TOPICAL_CREAM | CUTANEOUS | 0 refills | Status: DC | PRN

## 2023-08-01 NOTE — Patient Outreach (Signed)
Care Coordination   Follow Up Visit Note   08/01/2023 Name: Kara Mullins MRN: [id number] DOB: August 28, 1951  Kara Mullins is a 72 y.o. year old female who sees [name], [name], NP for primary care. I spoke with daughter Kara Mullins by phone today.  What matters to the patients health and wellness today?  Patient would like to avoid readmissions into the hospital.     Goals Addressed             This Visit's Progress    To avoid readmission into the hospital       Care Coordination Interventions: Assessed the POA daughter Lakeisa Towns understanding of chronic kidney disease    Evaluation of current treatment plan related to end stage renal disease self management and patient's adherence to plan as established by provider      Advised patient to discuss EMLA cream  with provider    Discussed plans with patient for ongoing care coordination follow up and provided patient with direct contact information for nurse care coordinator Sent in basket message to [name] NP requesting an Rx to patient's preferred pharmacy for use before HD txts     Interventions Today    Flowsheet Row Most Recent Value  Chronic Disease   Chronic disease during today's visit Chronic Kidney Disease/End Stage Renal Disease (ESRD)  General Interventions   General Interventions Discussed/Reviewed General Interventions Discussed, General Interventions Reviewed, Doctor Visits, Communication with  Doctor Visits Discussed/Reviewed Doctor Visits Discussed, Doctor Visits Reviewed, Specialist, PCP  Communication with PCP/Specialists  Carilyn Goodpasture [name] NP]  Education Interventions   Education Provided Provided Education  Provided Verbal Education On When to see the doctor, Nutrition, Medication  Nutrition Interventions   Nutrition Discussed/Reviewed Nutrition Discussed, Nutrition Reviewed, Fluid intake  Pharmacy Interventions   Pharmacy Dicussed/Reviewed Pharmacy Topics Reviewed, Pharmacy Topics Discussed,  Medications and their functions           SDOH assessments and interventions completed:  No     Care Coordination Interventions:  Yes, provided   Follow up plan: Follow up call scheduled for 09/30/23 @2 :30 PM    Encounter Outcome:  Patient Visit Completed

## 2023-08-01 NOTE — Patient Instructions (Signed)
Visit Information  Thank you for taking time to visit with me today. Please don't hesitate to contact me if I can be of assistance to you.   Following are the goals we discussed today:   Goals Addressed             This Visit's Progress    To avoid readmission into the hospital       Care Coordination Interventions: Assessed the POA daughter Genia Kaminska understanding of chronic kidney disease    Evaluation of current treatment plan related to end stage renal disease self management and patient's adherence to plan as established by provider      Advised patient to discuss EMLA cream  with provider    Discussed plans with patient for ongoing care coordination follow up and provided patient with direct contact information for nurse care coordinator Sent in basket message to [facility] NP requesting an Rx to patient's preferred pharmacy for use before HD txts          Our next appointment is by telephone on 09/30/23 at 2:30 PM  Please call the care guide team at [phone] if you need to cancel or reschedule your appointment.   If you are experiencing a Mental Health or Behavioral Health Crisis or need someone to talk to, please call 1-800-273-TALK (toll free, 24 hour hotline)  Patient verbalizes understanding of instructions and care plan provided today and agrees to view in MyChart. Active MyChart status and patient understanding of how to access instructions and care plan via MyChart confirmed with patient.     Delsa Sale RN BSN CCM [place]  [facility] Nurse Care Coordinator  Direct Dial: [phone] Website: [email or web address]

## 2023-08-01 NOTE — Telephone Encounter (Signed)
EMLA cream prescription send to pharmacy.

## 2023-08-07 DIAGNOSIS — I890 Lymphedema, not elsewhere classified: Secondary | ICD-10-CM | POA: Diagnosis not present

## 2023-08-07 DIAGNOSIS — M898X9 Other specified disorders of bone, unspecified site: Secondary | ICD-10-CM | POA: Diagnosis not present

## 2023-08-07 DIAGNOSIS — E785 Hyperlipidemia, unspecified: Secondary | ICD-10-CM | POA: Diagnosis not present

## 2023-08-07 DIAGNOSIS — N2581 Secondary hyperparathyroidism of renal origin: Secondary | ICD-10-CM | POA: Diagnosis not present

## 2023-08-07 DIAGNOSIS — Z6825 Body mass index (BMI) 25.0-25.9, adult: Secondary | ICD-10-CM | POA: Diagnosis not present

## 2023-08-07 DIAGNOSIS — I132 Hypertensive heart and chronic kidney disease with heart failure and with stage 5 chronic kidney disease, or end stage renal disease: Secondary | ICD-10-CM | POA: Diagnosis not present

## 2023-08-07 DIAGNOSIS — D693 Immune thrombocytopenic purpura: Secondary | ICD-10-CM | POA: Diagnosis not present

## 2023-08-07 DIAGNOSIS — J90 Pleural effusion, not elsewhere classified: Secondary | ICD-10-CM | POA: Diagnosis not present

## 2023-08-07 DIAGNOSIS — Z992 Dependence on renal dialysis: Secondary | ICD-10-CM | POA: Diagnosis not present

## 2023-08-07 DIAGNOSIS — I083 Combined rheumatic disorders of mitral, aortic and tricuspid valves: Secondary | ICD-10-CM | POA: Diagnosis not present

## 2023-08-07 DIAGNOSIS — I509 Heart failure, unspecified: Secondary | ICD-10-CM | POA: Diagnosis not present

## 2023-08-07 DIAGNOSIS — F411 Generalized anxiety disorder: Secondary | ICD-10-CM | POA: Diagnosis not present

## 2023-08-07 DIAGNOSIS — L89150 Pressure ulcer of sacral region, unstageable: Secondary | ICD-10-CM | POA: Diagnosis not present

## 2023-08-14 DIAGNOSIS — L89150 Pressure ulcer of sacral region, unstageable: Secondary | ICD-10-CM | POA: Diagnosis not present

## 2023-08-14 DIAGNOSIS — I129 Hypertensive chronic kidney disease with stage 1 through stage 4 chronic kidney disease, or unspecified chronic kidney disease: Secondary | ICD-10-CM | POA: Diagnosis not present

## 2023-08-14 DIAGNOSIS — N2581 Secondary hyperparathyroidism of renal origin: Secondary | ICD-10-CM | POA: Diagnosis not present

## 2023-08-14 DIAGNOSIS — I132 Hypertensive heart and chronic kidney disease with heart failure and with stage 5 chronic kidney disease, or end stage renal disease: Secondary | ICD-10-CM | POA: Diagnosis not present

## 2023-08-14 DIAGNOSIS — N186 End stage renal disease: Secondary | ICD-10-CM | POA: Diagnosis not present

## 2023-08-14 DIAGNOSIS — M898X9 Other specified disorders of bone, unspecified site: Secondary | ICD-10-CM | POA: Diagnosis not present

## 2023-08-14 DIAGNOSIS — I890 Lymphedema, not elsewhere classified: Secondary | ICD-10-CM | POA: Diagnosis not present

## 2023-08-14 DIAGNOSIS — I509 Heart failure, unspecified: Secondary | ICD-10-CM | POA: Diagnosis not present

## 2023-08-14 DIAGNOSIS — E785 Hyperlipidemia, unspecified: Secondary | ICD-10-CM | POA: Diagnosis not present

## 2023-08-14 DIAGNOSIS — J90 Pleural effusion, not elsewhere classified: Secondary | ICD-10-CM | POA: Diagnosis not present

## 2023-08-14 DIAGNOSIS — Z992 Dependence on renal dialysis: Secondary | ICD-10-CM | POA: Diagnosis not present

## 2023-08-14 DIAGNOSIS — F411 Generalized anxiety disorder: Secondary | ICD-10-CM | POA: Diagnosis not present

## 2023-08-14 DIAGNOSIS — Z6825 Body mass index (BMI) 25.0-25.9, adult: Secondary | ICD-10-CM | POA: Diagnosis not present

## 2023-08-14 DIAGNOSIS — D693 Immune thrombocytopenic purpura: Secondary | ICD-10-CM | POA: Diagnosis not present

## 2023-08-14 DIAGNOSIS — I083 Combined rheumatic disorders of mitral, aortic and tricuspid valves: Secondary | ICD-10-CM | POA: Diagnosis not present

## 2023-08-15 DIAGNOSIS — D509 Iron deficiency anemia, unspecified: Secondary | ICD-10-CM | POA: Diagnosis not present

## 2023-08-15 DIAGNOSIS — T8249XA Other complication of vascular dialysis catheter, initial encounter: Secondary | ICD-10-CM | POA: Diagnosis not present

## 2023-08-15 DIAGNOSIS — D631 Anemia in chronic kidney disease: Secondary | ICD-10-CM | POA: Diagnosis not present

## 2023-08-15 DIAGNOSIS — Z23 Encounter for immunization: Secondary | ICD-10-CM | POA: Diagnosis not present

## 2023-08-15 DIAGNOSIS — N181 Chronic kidney disease, stage 1: Secondary | ICD-10-CM | POA: Diagnosis not present

## 2023-08-15 DIAGNOSIS — D689 Coagulation defect, unspecified: Secondary | ICD-10-CM | POA: Diagnosis not present

## 2023-08-15 DIAGNOSIS — Z992 Dependence on renal dialysis: Secondary | ICD-10-CM | POA: Diagnosis not present

## 2023-08-15 DIAGNOSIS — N2581 Secondary hyperparathyroidism of renal origin: Secondary | ICD-10-CM | POA: Diagnosis not present

## 2023-08-15 DIAGNOSIS — N186 End stage renal disease: Secondary | ICD-10-CM | POA: Diagnosis not present

## 2023-08-24 NOTE — Progress Notes (Unsigned)
Cardiology Office Note:  .   Date:  08/26/2023  ID:  Kara Mullins, DOB April 14, 1951, MRN [id number] PCP: Kara Bookman, NP  [facility] Providers Cardiologist:  Kara Harbour, NP {  History of Present Illness: .   Kara Mullins is a 72 y.o. female With a past medical history of hypertension, hyperlipidemia, chronic lymphedema, ESRD on HD, chronic combined systolic and diastolic heart failure with improved EF.  Per chart review, patient was previously admitted in 02/2021 with shortness of breath.  Underwent echocardiogram 03/05/21 that showed EF 45%, severe LVH concerning for cardiac amyloid.  She later underwent cardiac MRI in 04/2021 that showed no evidence of cardiac amyloidosis, moderate asymmetric LVH not meeting HCM criteria.  LVEF 53%, RVEF 57%, no LGE.  In 11/2021, patient was admitted with with AKI.  Her baseline creatinine had previously been 1.36-1.6, creatinine was elevated to 4.7 on presentation.  Nephrology was consulted,  and felt that her AKI was likely secondary to ATN, could not rule out plasma cell dyscrasia.  In setting of AKI, her Lasix, losartan, spironolactone were discontinued.  She was discharged on Lasix as needed.  In 02/2023, patient was admitted for acute CHF exacerbation.  Patient had recently received blood transfusion, reported feeling worse after transfusion.  She reported worsening lower extremity edema, shortness of breath.  Creatinine was elevated to 3.91, BNP 1037.  Chest x-ray indicated bilateral pleural effusions.  Echocardiogram 02/26/23 showed EF 50-55%, severe LVH, normal RV function, large pleural effusion, mild MR.  Nephrology was consulted and diuresis was deferred to their control.  She was discharged on torsemide twice daily.  In 03/2023, patient noted increasing dyspnea and presented to [facility], ED for workup.  Blood work showed creatinine 3.88.  Troponin negative.  BNP 715.  Chest x-ray showed stable moderate-marked bilateral airspace disease  with stable small-moderate sized bilateral pleural effusions.  Echocardiogram 03/23/2023 showed EF 45-50%, moderately elevated PA systolic pressure, large pleural effusion in the left lateral region, mild mitral valve regurgitation.  Nephrology was consulted and she was started on dialysis.  Vascular surgery was consulted and placed an AV fistula on 03/27/2023.  She was discharged on 04/01/2023 on a TTS hemodialysis schedule.  Nephrology recommended Lasix 80 mg on Monday, Wednesday, Friday, Sunday.  She was seen in clinic on 04/21/2023 for a hospital follow-up appointment, she remained stable from a cardiac perspective.  Patient presented to [facility] ED on 06/11/2023 for hypertension, shortness of breath.  Patient reported missing a dose of Lasix today prior, blood pressure in the ED elevated to 189/84.  BNP was elevated to 3200.  Chest x-ray showed bilateral pulmonary edema and R effusion.  She underwent a right sided thoracentesis with more than 1 L fluid removed.  She was seen by nephrology, her dry weight threshold was reduced.  Echocardiogram on 06/12/2023 showed EF 55%, mild LVH, normal RV function, mild MR.  Today, patient presented for a follow up appointment. She reported experiencing intermittent shortness of breath over the past few days, which was more pronounced upon waking up. The patient described the sensation as needing more air, likening it to a stuffy feeling. She noted that the symptom seemed to improve with cooler air and when sitting in front of an open door or fan. The patient denied any associated cough or sputum production. Denied orthopnea   The patient's dialysis schedule was Tuesday, Thursday, and Saturday, and she reported feeling better post-dialysis. She also reported good urine output on Lasix, which she  took on non-dialysis days. The patient's weight had decreased significantly since starting dialysis in June, from 186 to 134 pounds, and she reported that the chronic swelling in her  legs was less than usual.  The patient's daughter, who was present during the consultation, reported that the patient's heart rate had been within normal limits, usually in the 60s-70s. The patient was on carvedilol, which was held on dialysis days due to concerns about low blood pressure.  The patient denied any issues with dizziness or lightheadedness during dialysis, except for one instance when too much fluid was removed. She also denied any feelings of bloating or abdominal discomfort. The patient's appetite was reported to be good, although she struggled with consuming the protein shakes recommended by her kidney doctors.   ROS: Denies chest pain, palpitations, syncope, near syncope, dizziness. Ankle edema is stable.   Studies Reviewed: .    Cardiac Studies & Procedures       ECHOCARDIOGRAM  ECHOCARDIOGRAM LIMITED 06/12/2023  Narrative ECHOCARDIOGRAM LIMITED REPORT    Patient Name:   Kara Mullins Date of Exam: 06/12/2023 Medical Rec #:  [id number]      Height:       65.0 in Accession #:    [id number]     Weight:       164.0 lb Date of Birth:  01/29/1951     BSA:          1.818 m Patient Age:    71 years       BP:           137/66 mmHg Patient Gender: F              HR:           62 bpm. Exam Location:  Inpatient  Procedure: Limited Echo, Color Doppler and Cardiac Doppler  Indications:    I50.21 Acute systolic (congestive) heart failure  History:        Patient has prior history of Echocardiogram examinations, most recent 03/23/2023. CHF; Risk Factors:Hypertension.  Sonographer:    Kara Mullins Senior RDCS Referring Phys: [phone] Kara Mullins  IMPRESSIONS   1. Left ventricular ejection fraction, by estimation, is 55%. The left ventricle has normal function. There is mild left ventricular hypertrophy. 2. Right ventricular systolic function is normal. The right ventricular size is mildly enlarged. Tricuspid regurgitation signal is inadequate for assessing PA pressure. 3.  Large pleural effusion in the left lateral region. 4. The mitral valve is degenerative. Mild mitral valve regurgitation. Moderate to severe mitral annular calcification. 5. The aortic valve is tricuspid. There is mild calcification of the aortic valve. Aortic valve regurgitation is trivial. Aortic valve sclerosis is present, with no evidence of aortic valve stenosis. 6. The inferior vena cava is normal in size with <50% respiratory variability, suggesting right atrial pressure of 8 mmHg.  FINDINGS Left Ventricle: Left ventricular ejection fraction, by estimation, is 55%. The left ventricle has normal function. There is mild left ventricular hypertrophy.  Right Ventricle: The right ventricular size is mildly enlarged. Right ventricular systolic function is normal. Tricuspid regurgitation signal is inadequate for assessing PA pressure.  Pericardium: Trivial pericardial effusion is present.  Mitral Valve: The mitral valve is degenerative in appearance. Moderate to severe mitral annular calcification. Mild mitral valve regurgitation.  Tricuspid Valve: Tricuspid valve regurgitation is trivial.  Aortic Valve: The aortic valve is tricuspid. There is mild calcification of the aortic valve. Aortic valve regurgitation is trivial. Aortic valve sclerosis is present, with no evidence of aortic  valve stenosis.  Venous: The inferior vena cava is normal in size with less than 50% respiratory variability, suggesting right atrial pressure of 8 mmHg.  Additional Comments: There is a large pleural effusion in the left lateral region. Spectral Doppler performed. Color Doppler performed.   LV Volumes (MOD) LV vol d, MOD A2C: 78.0 ml LV vol d, MOD A4C: 100.0 ml LV vol s, MOD A2C: 35.3 ml LV vol s, MOD A4C: 36.0 ml LV SV MOD A2C:     42.7 ml LV SV MOD A4C:     100.0 ml LV SV MOD BP:      52.8 ml  AORTIC VALVE LVOT Vmax:   121.00 cm/s LVOT Vmean:  77.600 cm/s LVOT VTI:    0.283 m   SHUNTS Systemic VTI:  0.28 m  Weston Brass MD Electronically signed by Weston Brass MD Signature Date/Time: 06/12/2023/5:03:56 PM    Final      CARDIAC MRI  MR CARDIAC MORPHOLOGY W WO CONTRAST 04/30/2021  Narrative CLINICAL DATA:  Evaluate for amyloidosis  EXAM: CARDIAC MRI  TECHNIQUE: The patient was scanned on a 1.5 Tesla Siemens magnet. A dedicated cardiac coil was used. Functional imaging was done using Fiesta sequences. 2,3, and 4 chamber views were done to assess for RWMA's. Modified Simpson's rule using a short axis stack was used to calculate an ejection fraction on a dedicated work [employer]. The patient received cc of Gadavist. After 10 minutes inversion recovery sequences were used to assess for infiltration and scar tissue.  CONTRAST:  8 cc  of Gadavist  FINDINGS: Left ventricle:  -Asymmetric hypertrophy measuring up to 14mm in basal septum (7mm in posterior wall)  -Mild dilatation  -Low normal systolic function  -Elevated ECV (32%)  -No LGE  LV EF:  53% (Normal 56-78%)  Absolute volumes:  LV EDV: (Normal 52-141 mL)  LV ESV: 98mL (Normal 13-51 mL)  LV SV: (Normal 33-97 mL)  CO: 6.5L/min (Normal 2.7-6.0 L/min)  Indexed volumes:  LV EDV: 177mL/sq-m (Normal 41-81 mL/sq-m)  LV ESV: 37mL/sq-m (Normal 12-21 mL/sq-m)  LV SV: 62mL/sq-m (Normal 26-56 mL/sq-m)  CI: 3.4L/min/sq-m (Normal 1.8-3.8 L/min/sq-m)  Right ventricle: Normal size and systolic function  RV EF: 57% (Normal 47-80%)  Absolute volumes:  RV EDV: (Normal 58-154 mL)  RV ESV: 70mL (Normal 12-68 mL)  RV SV: 92mL (Normal 35-98 mL)  CO: 5.5L/min (Normal 2.7-6 L/min)  Indexed volumes:  RV EDV: 44mL/sq-m (Normal 48-87 mL/sq-m)  RV ESV: 57mL/sq-m (Normal 11-28 mL/sq-m)  RV SV: 49mL/sq-m (Normal 27-57 mL/sq-m)  CI: 2.9L/min/sq-m (Normal 1.8-3.8 L/min/sq-m)  Left atrium: Mild enlargement  Right atrium: Mild enlargement  Mitral valve: Mild to  moderate regurgitation (not quantified)  Aortic valve: Tricuspid.  No regurgitation  Tricuspid valve: No regurgitation  Pulmonic valve: No regurgitation  Aorta: Normal proximal ascending aorta  Pericardium: Small effusion  Extracardiac structures: Bilateral pleural effusions  IMPRESSION: 1.  No evidence of cardiac amyloidosis  2. Asymmetric LV hypertrophy measuring up to 14mm in basal septum (7mm in posterior wall), not meeting criteria for hypertrophic cardiomyopathy (<15mm)  3.  Mild LV dilatation with low normal systolic function (EF 53%)  4.  Normal RV size and systolic function (EF 57%)  5.  No late gadolinium enhancement to suggest myocardial scar   Electronically Signed By: Epifanio Lesches MD On: 05/03/2021 21:37          Risk Assessment/Calculations:  Physical Exam:   VS:  BP 136/82 (BP Location: Left Arm, Patient Position: Sitting, Cuff Size: Normal)   Pulse 67   Ht 5\' 5"  (1.651 m)   Wt 134 lb (60.8 kg)   SpO2 99%   BMI 22.30 kg/m    Wt Readings from Last 3 Encounters:  08/26/23 134 lb (60.8 kg)  06/13/23 153 lb 7 oz (69.6 kg)  05/13/23 163 lb 8 oz (74.2 kg)    GEN: Well nourished, well developed in no acute distress. Sitting comfortably in her wheelchair  NECK: No JVD CARDIAC: RRR. Faint diastolic murmur  RESPIRATORY:  Clear to auscultation without rales, wheezing or rhonchi. Normal work of breathing on room air  ABDOMEN: Soft, non-tender, non-distended EXTREMITIES:  2+ edema in BLE; No deformity   ASSESSMENT AND PLAN: .    Chronic Heart Failure with Improved Ejection Fraction  - Previously had EF 45% in 2022  - Most recent echocardiogram from 05/2023 showed EF 55%, mild LVH, normal RV function, mild MR - She is on HD TTS. Also on lasix 80 mg Monday, Wednesday, Friday, Sunday per nephrology. Reports good urine output on lasix.  - Patient reports transient shortness of breath, reports feeling "stuffy" and like she needs more  air. Improves if she sits in a cooler room or in front of an open window. No orthopnea. Lungs are clear on exam today. I do not think that her transient shortness of breath is due to volume overload  - Continue carvedilol 3.125 mg BID, imdur 30 mg daily. Additional GDMT limited by renal function  - Volume managed by HD. Continue lasix on non-HD days   ESRD on HD  - Followed by nephrology. Denies dizziness or low BP on HD days  - Continue HD on TTS schedule   HTN - BP well controlled. no recent episodes of dizziness or hypotension. - Continue to take Carvedilol once daily on dialysis days to prevent hypotension during dialysis. Otherwise, continue carvedilol 3.125 mg BID, imdur 30 mg daily   Chronic lymphedema  - Patient notes significant improvement since starting HD   HLD  - Previously had difficulty swallowing with crestor  - Most recent lipid panel from 04/2023 showed LDL 91  - Continue to manage with low-cholesterol diet    Dispo: Follow up in 4 months with Dr. Bjorn Pippin   Signed, Jonita Albee, [name]

## 2023-08-26 ENCOUNTER — Encounter: Payer: Self-pay | Admitting: Cardiology

## 2023-08-26 ENCOUNTER — Ambulatory Visit: Payer: Medicare (Managed Care) | Attending: Nurse Practitioner | Admitting: Cardiology

## 2023-08-26 VITALS — BP 136/82 | HR 67 | Ht 65.0 in | Wt 134.0 lb

## 2023-08-26 DIAGNOSIS — E785 Hyperlipidemia, unspecified: Secondary | ICD-10-CM | POA: Diagnosis not present

## 2023-08-26 DIAGNOSIS — I10 Essential (primary) hypertension: Secondary | ICD-10-CM | POA: Diagnosis not present

## 2023-08-26 DIAGNOSIS — I5032 Chronic diastolic (congestive) heart failure: Secondary | ICD-10-CM

## 2023-08-26 DIAGNOSIS — N186 End stage renal disease: Secondary | ICD-10-CM | POA: Diagnosis not present

## 2023-08-26 DIAGNOSIS — Z992 Dependence on renal dialysis: Secondary | ICD-10-CM

## 2023-08-26 NOTE — Patient Instructions (Addendum)
Medication Instructions:  No changes *If you need a refill on your cardiac medications before your next appointment, please call your pharmacy*  Lab Work: No labs  Testing/Procedures: No testing   Follow-Up: At [facility], you and your health needs are our priority.  As part of our continuing mission to provide you with exceptional heart care, we have created designated Provider Care Teams.  These Care Teams include your primary Cardiologist (physician) and Advanced Practice Providers (APPs -  Physician Assistants and Nurse Practitioners) who all work together to provide you with the care you need, when you need it.  We recommend signing up for the patient portal called "MyChart".  Sign up information is provided on this After Visit Summary.  MyChart is used to connect with patients for Virtual Visits (Telemedicine).  Patients are able to view lab/test results, encounter notes, upcoming appointments, etc.  Non-urgent messages can be sent to your provider as well.   To learn more about what you can do with MyChart, go to [email or web address].    Your next appointment:   4 month(s)  Provider:   [name], MD

## 2023-08-29 ENCOUNTER — Other Ambulatory Visit: Payer: Self-pay

## 2023-08-29 MED ORDER — SEVELAMER CARBONATE 800 MG PO TABS: 800.0000 mg | ORAL_TABLET | Freq: Three times a day (TID) | ORAL | 3 refills | Status: DC

## 2023-08-29 NOTE — Telephone Encounter (Signed)
Patients daughter called requesting a refill on renvela. I did not see a fill history from our office, will send to [name], [name], NP to confirm ok to refill

## 2023-09-08 ENCOUNTER — Ambulatory Visit: Payer: Medicare (Managed Care) | Admitting: Podiatry

## 2023-09-13 DIAGNOSIS — I129 Hypertensive chronic kidney disease with stage 1 through stage 4 chronic kidney disease, or unspecified chronic kidney disease: Secondary | ICD-10-CM | POA: Diagnosis not present

## 2023-09-13 DIAGNOSIS — Z992 Dependence on renal dialysis: Secondary | ICD-10-CM | POA: Diagnosis not present

## 2023-09-13 DIAGNOSIS — N186 End stage renal disease: Secondary | ICD-10-CM | POA: Diagnosis not present

## 2023-09-16 DIAGNOSIS — D631 Anemia in chronic kidney disease: Secondary | ICD-10-CM | POA: Diagnosis not present

## 2023-09-16 DIAGNOSIS — Z23 Encounter for immunization: Secondary | ICD-10-CM | POA: Diagnosis not present

## 2023-09-16 DIAGNOSIS — T8249XA Other complication of vascular dialysis catheter, initial encounter: Secondary | ICD-10-CM | POA: Diagnosis not present

## 2023-09-16 DIAGNOSIS — D509 Iron deficiency anemia, unspecified: Secondary | ICD-10-CM | POA: Diagnosis not present

## 2023-09-16 DIAGNOSIS — N186 End stage renal disease: Secondary | ICD-10-CM | POA: Diagnosis not present

## 2023-09-16 DIAGNOSIS — Z992 Dependence on renal dialysis: Secondary | ICD-10-CM | POA: Diagnosis not present

## 2023-09-16 DIAGNOSIS — N2581 Secondary hyperparathyroidism of renal origin: Secondary | ICD-10-CM | POA: Diagnosis not present

## 2023-09-16 DIAGNOSIS — D689 Coagulation defect, unspecified: Secondary | ICD-10-CM | POA: Diagnosis not present

## 2023-09-22 ENCOUNTER — Other Ambulatory Visit: Payer: Self-pay | Admitting: Family

## 2023-09-22 DIAGNOSIS — N186 End stage renal disease: Secondary | ICD-10-CM

## 2023-09-30 ENCOUNTER — Ambulatory Visit: Payer: Self-pay

## 2023-09-30 NOTE — Patient Instructions (Signed)
Visit Information  Thank you for taking time to visit with me today. Please don't hesitate to contact me if I can be of assistance to you.   Following are the goals we discussed today:   Goals Addressed             This Visit's Progress    To avoid readmission into the hospital   On track    Care Coordination Interventions: Assessed the POA daughter Oceane Strehle understanding of chronic kidney disease    Evaluation of current treatment plan related to end stage renal disease self management and patient's adherence to plan as established by provider      Determined patient's hemodialysis treatments are going well, she is tolerating her treatments and gains very Meryn Sarracino fluid between treatments Discussed patient's goal is to regain her mobility, she is stronger and is able to stand with the stander, she is able to pivot and transfer without assistance Determined patient is participating in a HEP previously instructed by PT Educated daughter and patient about the benefits of outpatient PT now that patient is stronger, discussed her knee "buckles" Educated daughter and patient about [facility] in [place] and advised patient may walk in or schedule an appointment  Discussed patient may ask orthopedics for outpatient PT referral and or she may schedule a f/u with her PCP to discuss referral for outpatient PT Determined patient continues to struggle with intake of adequate protein, educated daughter and patient about options for adding protein supplementation Instructed daughter and patient to ask patient's renal dietician about any supplements before hand in order to ensure the protein supplement does not contain excessive amounts of other ingredients, such as potassium and phosphorus  Discussed patient and daughter verbalize understanding of the education and recommendations discussed during today's encounter  Discussed plans with patient for ongoing care coordination follow up and provided  patient with direct contact information for nurse care coordinator        Our next appointment is by telephone on 12/01/23 at 2:30 PM  Please call the care guide team at [phone] if you need to cancel or reschedule your appointment.   If you are experiencing a Mental Health or Behavioral Health Crisis or need someone to talk to, please call 1-800-273-TALK (toll free, 24 hour hotline)  Patient verbalizes understanding of instructions and care plan provided today and agrees to view in MyChart. Active MyChart status and patient understanding of how to access instructions and care plan via MyChart confirmed with patient.     Delsa Sale RN BSN CCM [place]  [facility], [facility] Nurse Care Coordinator  Direct Dial: [phone] Website: [email or web address]

## 2023-09-30 NOTE — Patient Outreach (Signed)
  Care Coordination   Follow Up Visit Note   09/30/2023 Name: Kara Mullins MRN: [id number] DOB: 10/03/1951  Kara Mullins is a 72 y.o. year old female who sees [name], [name], NP for primary care. I spoke with Kara Mullins and daughter Kara Mullins by phone today.  What matters to the patients health and wellness today?  Patient would like to increase her protein intake and regain her physical mobility.     Goals Addressed             This Visit's Progress    To avoid readmission into the hospital   On track    Care Coordination Interventions: Assessed the POA daughter Kara Mullins understanding of chronic kidney disease    Evaluation of current treatment plan related to end stage renal disease self management and patient's adherence to plan as established by provider      Determined patient's hemodialysis treatments are going well, she is tolerating her treatments and gains very Kara Mullins fluid between treatments Discussed patient's goal is to regain her mobility, she is stronger and is able to stand with the stander, she is able to pivot and transfer without assistance Determined patient is participating in a HEP previously instructed by PT Educated daughter and patient about the benefits of outpatient PT now that patient is stronger, discussed her knee "buckles" Educated daughter and patient about [facility] in [place] and advised patient may walk in or schedule an appointment  Discussed patient may ask orthopedics for outpatient PT referral and or she may schedule a f/u with her PCP to discuss referral for outpatient PT Determined patient continues to struggle with intake of adequate protein, educated daughter and patient about options for adding protein supplementation Instructed daughter and patient to ask patient's renal dietician about any supplements before hand in order to ensure the protein supplement does not contain excessive amounts of other ingredients, such as  potassium and phosphorus  Discussed patient and daughter verbalize understanding of the education and recommendations discussed during today's encounter  Discussed plans with patient for ongoing care coordination follow up and provided patient with direct contact information for nurse care coordinator    Interventions Today    Flowsheet Row Most Recent Value  Chronic Disease   Chronic disease during today's visit Chronic Kidney Disease/End Stage Renal Disease (ESRD), Other  [impaired physical mobility]  General Interventions   General Interventions Discussed/Reviewed General Interventions Discussed, General Interventions Reviewed, Doctor Visits, Labs  Doctor Visits Discussed/Reviewed PCP, Doctor Visits Reviewed, Doctor Visits Discussed, Specialist  Exercise Interventions   Exercise Discussed/Reviewed Exercise Reviewed, Exercise Discussed, Physical Activity  Physical Activity Discussed/Reviewed Physical Activity Reviewed, Physical Activity Discussed, Home Exercise Program (HEP), Types of exercise  Education Interventions   Education Provided Provided Education  Provided Verbal Education On Nutrition, When to see the doctor, Labs, Exercise  Labs Reviewed --  [Albumin,  protein,  Hgb]  Nutrition Interventions   Nutrition Discussed/Reviewed Nutrition Discussed, Nutrition Reviewed, Increasing proteins  Pharmacy Interventions   Pharmacy Dicussed/Reviewed Pharmacy Topics Reviewed  Safety Interventions   Safety Discussed/Reviewed Safety Reviewed, Fall Risk, Home Safety, Safety Discussed  Home Safety Assistive Devices            SDOH assessments and interventions completed:  Yes     Care Coordination Interventions:  Yes, provided   Follow up plan: Follow up call scheduled for 12/01/23 @2 :30 PM    Encounter Outcome:  Patient Visit Completed

## 2023-10-10 ENCOUNTER — Encounter: Payer: Self-pay | Admitting: Podiatry

## 2023-10-10 ENCOUNTER — Ambulatory Visit (INDEPENDENT_AMBULATORY_CARE_PROVIDER_SITE_OTHER): Payer: Medicare (Managed Care) | Admitting: Podiatry

## 2023-10-10 DIAGNOSIS — R600 Localized edema: Secondary | ICD-10-CM

## 2023-10-10 DIAGNOSIS — B351 Tinea unguium: Secondary | ICD-10-CM | POA: Diagnosis not present

## 2023-10-10 DIAGNOSIS — L84 Corns and callosities: Secondary | ICD-10-CM | POA: Diagnosis not present

## 2023-10-10 DIAGNOSIS — M79674 Pain in right toe(s): Secondary | ICD-10-CM

## 2023-10-10 DIAGNOSIS — M79675 Pain in left toe(s): Secondary | ICD-10-CM

## 2023-10-11 ENCOUNTER — Other Ambulatory Visit: Payer: Self-pay | Admitting: Family

## 2023-10-11 DIAGNOSIS — Z992 Dependence on renal dialysis: Secondary | ICD-10-CM

## 2023-10-13 NOTE — Progress Notes (Signed)
Subjective:   Patient ID: Kara Mullins, female   DOB: 72 y.o.   MRN: [id number]   HPI Patient presents with caregivers with concerns about nail disease and callus formation heel that they have not noted drainage but she is in poor health.  Patient does not currently smoke not active   Review of Systems  All other systems reviewed and are negative.       Objective:  Physical Exam Vitals and nursing note reviewed.  Constitutional:      Appearance: She is well-developed.  Pulmonary:     Effort: Pulmonary effort is normal.  Musculoskeletal:        General: Normal range of motion.  Skin:    General: Skin is warm.  Neurological:     Mental Status: She is alert.     Neurovascular status intact muscle strength was found to be adequate range of motion within normal limits.  Patient is noted to have thick yellow brittle nailbeds 1-5 both feet that are impossible for them to cut and has lesions on the heel crusted that have been present for a while minimal discomfort.  Good digital perfusion well-oriented x 3     Assessment:  Poor health individual with lesions and nail disease bilateral with the nails being painful     Plan:  H&P reviewed all conditions discussed the heels do not recommend treatment but daily inspections if any drainage redness or indication of breakdown of tissue will be seen immediately.  Debrided nailbeds today 1-5 both feet no iatrogenic bleeding reappoint routine care

## 2023-10-14 DIAGNOSIS — N186 End stage renal disease: Secondary | ICD-10-CM | POA: Diagnosis not present

## 2023-10-14 DIAGNOSIS — I129 Hypertensive chronic kidney disease with stage 1 through stage 4 chronic kidney disease, or unspecified chronic kidney disease: Secondary | ICD-10-CM | POA: Diagnosis not present

## 2023-10-14 DIAGNOSIS — Z992 Dependence on renal dialysis: Secondary | ICD-10-CM | POA: Diagnosis not present

## 2023-10-16 DIAGNOSIS — D631 Anemia in chronic kidney disease: Secondary | ICD-10-CM | POA: Diagnosis not present

## 2023-10-16 DIAGNOSIS — N186 End stage renal disease: Secondary | ICD-10-CM | POA: Diagnosis not present

## 2023-10-16 DIAGNOSIS — N2581 Secondary hyperparathyroidism of renal origin: Secondary | ICD-10-CM | POA: Diagnosis not present

## 2023-10-16 DIAGNOSIS — D689 Coagulation defect, unspecified: Secondary | ICD-10-CM | POA: Diagnosis not present

## 2023-10-16 DIAGNOSIS — T8249XA Other complication of vascular dialysis catheter, initial encounter: Secondary | ICD-10-CM | POA: Diagnosis not present

## 2023-10-16 DIAGNOSIS — Z992 Dependence on renal dialysis: Secondary | ICD-10-CM | POA: Diagnosis not present

## 2023-11-15 DIAGNOSIS — N181 Chronic kidney disease, stage 1: Secondary | ICD-10-CM | POA: Diagnosis not present

## 2023-11-15 DIAGNOSIS — D631 Anemia in chronic kidney disease: Secondary | ICD-10-CM | POA: Diagnosis not present

## 2023-11-15 DIAGNOSIS — N2581 Secondary hyperparathyroidism of renal origin: Secondary | ICD-10-CM | POA: Diagnosis not present

## 2023-11-15 DIAGNOSIS — Z23 Encounter for immunization: Secondary | ICD-10-CM | POA: Diagnosis not present

## 2023-11-15 DIAGNOSIS — Z992 Dependence on renal dialysis: Secondary | ICD-10-CM | POA: Diagnosis not present

## 2023-11-15 DIAGNOSIS — N186 End stage renal disease: Secondary | ICD-10-CM | POA: Diagnosis not present

## 2023-11-20 ENCOUNTER — Other Ambulatory Visit: Payer: Self-pay

## 2023-11-20 ENCOUNTER — Other Ambulatory Visit: Payer: Self-pay | Admitting: Adult Health

## 2023-11-20 DIAGNOSIS — I5042 Chronic combined systolic (congestive) and diastolic (congestive) heart failure: Secondary | ICD-10-CM

## 2023-11-20 MED ORDER — FUROSEMIDE 80 MG PO TABS
ORAL_TABLET | ORAL | 0 refills | Status: DC
Start: 2023-11-20 — End: 2024-01-18

## 2023-11-20 NOTE — Telephone Encounter (Signed)
 Patient is requesting a refill of the following medications: Requested Prescriptions   Pending Prescriptions Disp Refills   furosemide  (LASIX ) 80 MG tablet 90 tablet 1    Sig: Take 1 tablet (80 mg total) by mouth every Monday, Wednesday, Friday and Sunday.    Date of last refill:04/22/2023  Refill amount: 90 tablets 1 refill   Treatment agreement date:

## 2023-11-24 ENCOUNTER — Telehealth: Payer: Self-pay | Admitting: *Deleted

## 2023-11-24 NOTE — Telephone Encounter (Signed)
 Received Prior Authorization for patient's Sevelamer  800mg  From [employer]. Placed form in [name]'s folder to review, fill out and sign.  To be faxed back to [place] Fax: [phone] once completed.

## 2023-11-27 ENCOUNTER — Telehealth: Payer: Self-pay

## 2023-11-27 NOTE — Telephone Encounter (Signed)
Patient of Dr. Charlsie Merles - she has rubbed a blister on her heel - daughter is asking for a cream or ointment to put on it

## 2023-12-01 ENCOUNTER — Ambulatory Visit: Payer: Self-pay

## 2023-12-01 NOTE — Patient Outreach (Signed)
  Care Coordination   Follow Up Visit Note   12/01/2023 Name: Kara Mullins MRN: [id number] DOB: January 21, 1951  Kara Mullins is a 73 y.o. year old female who sees [name], [name], NP for primary care. I spoke with daughter Nohea Kras by phone today.  What matters to the patients health and wellness today?  Daughter [name] would like for her mother to gain weight.     Goals Addressed             This Visit's Progress    COMPLETED: To avoid readmission into the hospital       Care Coordination Interventions: Evaluation of current treatment plan related to end stage renal disease self management and patient's adherence to plan as established by provider     Completed successful call with daughter [name]  Determined patient's hemodialysis treatments are going well, she is tolerating her treatments and gains very Aureliano Oshields fluid between treatments Discussed daughter's only concern is related to her mother's weight loss, she is down to approximately 118 lbs, she was around 134 lbs in 11/24, daughter believes this is muscle weight and not fluid weight Discussed having daughter [name] ask patient's renal dietician about the best protein supplement and healthy amount to take in  Educated daughter about Mirtazapine, including indication and how to take this medication, encouraged [name] to ask patient's Nephrologist about taking this medication  Assessed for SDOH barriers, none identified at this time  Advised daughter [name] of nurse CCM case closure due to patient is stable at this time  Instructed [name] to keep patient's health care provider informed of new symptoms or concerns if they arise and she verbalizes understanding    Interventions Today    Flowsheet Row Most Recent Value  Chronic Disease   Chronic disease during today's visit Chronic Kidney Disease/End Stage Renal Disease (ESRD), Other  [weight loss]  General Interventions   General Interventions Discussed/Reviewed General  Interventions Discussed, General Interventions Reviewed, Labs, Doctor Visits  Doctor Visits Discussed/Reviewed Doctor Visits Reviewed, Doctor Visits Discussed, Specialist, PCP  Exercise Interventions   Exercise Discussed/Reviewed Weight Managment  Weight Management Weight loss  Education Interventions   Education Provided Provided Education  Provided Verbal Education On Nutrition, When to see the doctor  Nutrition Interventions   Nutrition Discussed/Reviewed Nutrition Discussed, Nutrition Reviewed, Increasing proteins  Pharmacy Interventions   Pharmacy Dicussed/Reviewed Pharmacy Topics Reviewed, Pharmacy Topics Discussed, Medications and their functions          SDOH assessments and interventions completed:  Yes  SDOH Interventions Today    Flowsheet Row Most Recent Value  SDOH Interventions   Food Insecurity Interventions Intervention Not Indicated  Housing Interventions Intervention Not Indicated  Transportation Interventions Intervention Not Indicated  Utilities Interventions Intervention Not Indicated        Care Coordination Interventions:  Yes, provided   Follow up plan: No further intervention required.   Encounter Outcome:  Patient Visit Completed

## 2023-12-01 NOTE — Patient Instructions (Signed)
Visit Information  Thank you for taking time to visit with me today. Please don't hesitate to contact me if I can be of assistance to you.   Following are the goals we discussed today:   Goals Addressed             This Visit's Progress    COMPLETED: To avoid readmission into the hospital       Care Coordination Interventions: Evaluation of current treatment plan related to end stage renal disease self management and patient's adherence to plan as established by provider     Completed successful call with daughter [name]  Determined patient's hemodialysis treatments are going well, she is tolerating her treatments and gains very Castiel Lauricella fluid between treatments Discussed daughter's only concern is related to her mother's weight loss, she is down to approximately 118 lbs, she was around 134 lbs in 11/24, daughter believes this is muscle weight and not fluid weight Discussed having daughter [name] ask patient's renal dietician about the best protein supplement and healthy amount to take in  Educated daughter about Mirtazapine, including indication and how to take this medication, encouraged [name] to ask patient's Nephrologist about taking this medication  Assessed for SDOH barriers, none identified at this time  Advised daughter [name] of nurse CCM case closure due to patient is stable at this time  Instructed [name] to keep patient's health care provider informed of new symptoms or concerns if they arise and she verbalizes understanding       If you are experiencing a Mental Health or Behavioral Health Crisis or need someone to talk to, please call 1-800-273-TALK (toll free, 24 hour hotline)  Patient verbalizes understanding of instructions and care plan provided today and agrees to view in [place]. Active MyChart status and patient understanding of how to access instructions and care plan via MyChart confirmed with patient.     Delsa Sale RN BSN CCM [place]  [facility], [facility] Nurse Care Coordinator  Direct Dial: [phone] Website: [email or web address]

## 2023-12-02 ENCOUNTER — Other Ambulatory Visit: Payer: Self-pay | Admitting: Podiatry

## 2023-12-02 DIAGNOSIS — I739 Peripheral vascular disease, unspecified: Secondary | ICD-10-CM

## 2023-12-02 MED ORDER — SILVER SULFADIAZINE 1 % EX CREA: 1.0000 | TOPICAL_CREAM | Freq: Every day | CUTANEOUS | 0 refills | Status: DC

## 2023-12-02 NOTE — Telephone Encounter (Signed)
Can we schedule her to come in and see someone this week? This is a Dr. Charlsie Merles patient, but anyone who does wounds can see her. Thanks!

## 2023-12-04 ENCOUNTER — Ambulatory Visit: Payer: Medicare (Managed Care) | Admitting: Podiatry

## 2023-12-08 ENCOUNTER — Ambulatory Visit (HOSPITAL_COMMUNITY)
Admission: RE | Admit: 2023-12-08 | Discharge: 2023-12-08 | Disposition: A | Discharge status: Home / Self Care | Payer: Medicare (Managed Care) | Source: Ambulatory Visit | Attending: Podiatry | Admitting: Podiatry

## 2023-12-08 DIAGNOSIS — I739 Peripheral vascular disease, unspecified: Secondary | ICD-10-CM | POA: Diagnosis not present

## 2023-12-08 LAB — VAS US ABI WITH/WO TBI

## 2023-12-09 ENCOUNTER — Ambulatory Visit
Admission: EM | Admit: 2023-12-09 | Discharge: 2023-12-09 | Disposition: A | Discharge status: Home / Self Care | Payer: Medicare (Managed Care) | Attending: Internal Medicine | Admitting: Internal Medicine

## 2023-12-09 ENCOUNTER — Telehealth: Payer: Self-pay

## 2023-12-09 DIAGNOSIS — J101 Influenza due to other identified influenza virus with other respiratory manifestations: Secondary | ICD-10-CM

## 2023-12-09 DIAGNOSIS — J0100 Acute maxillary sinusitis, unspecified: Secondary | ICD-10-CM

## 2023-12-09 LAB — POC COVID19/FLU A&B COMBO
Covid Antigen, POC: NEGATIVE
Influenza A Antigen, POC: POSITIVE — AB
Influenza B Antigen, POC: NEGATIVE

## 2023-12-09 MED ORDER — FLUTICASONE PROPIONATE 50 MCG/ACT NA SUSP: 2.0000 | Freq: Every day | NASAL | 2 refills | Status: DC

## 2023-12-09 MED ORDER — AZITHROMYCIN 250 MG PO TABS: ORAL_TABLET | ORAL | 0 refills | Status: DC

## 2023-12-09 NOTE — Telephone Encounter (Signed)
 Loratadine 10 mg tablet one by mouth daily for runny nose.Recommend testing for Influenza due to high rate of flu going around in the community.

## 2023-12-09 NOTE — ED Provider Notes (Signed)
 [facility]    CSN: [id number] Arrival date & time: 12/09/23  1443      History   Chief Complaint Chief Complaint  Patient presents with   Nasal Congestion    Nasal congestion, cough and chest congestion.     HPI Kara Mullins is a 73 y.o. female.   73 year old female who presents urgent care with complaints of nasal congestion, cough and some chest congestion.  She reports that her symptoms started 2 days ago.  She denies any fevers or chills.  She denies any new shortness of breath or chest pain.  She is on dialysis so 3 days a week she is at her dialysis center.  She is also being treated for lower extremity wound.  She has a history of heart failure but this has been stable.  She has some lower extremity edema that is actually improved right now.  She is eating and drinking without difficulty.  She denies any new fatigue or weakness.     Past Medical History:  Diagnosis Date   Chronic combined systolic and diastolic congestive heart failure (HCC) 11/09/2021   Generalized anxiety disorder    Hyperlipidemia 11/09/2021   Hypertension    Stage 3b chronic kidney disease (CKD) (HCC) 11/09/2021    Patient Active Problem List   Diagnosis Date Noted   ESRD on dialysis (HCC) 06/11/2023   Chronic idiopathic thrombocytopenia (HCC) 06/11/2023   Acute on chronic diastolic (congestive) heart failure (HCC) 03/22/2023   Anemia of chronic disease 03/22/2023   Sacral wound 03/22/2023   Acute on chronic diastolic CHF (congestive heart failure) (HCC) 02/26/2023   Cardiorenal syndrome 02/25/2023   Chronic kidney disease (CKD), stage IV (severe) (HCC) 02/25/2023   Hypoglycemia 08/15/2022   Hypertensive emergency 08/15/2022   Obesity (BMI 30-39.9) 08/15/2022   Acute on chronic diastolic heart failure (HCC) 08/14/2022   AKI (acute kidney injury) (HCC)    (HFpEF) heart failure with preserved ejection fraction (HCC) 06/02/2022   Acute respiratory distress 06/02/2022    Bilateral lower extremity edema 06/02/2022   Hypocalcemia 06/02/2022   Subclinical hypothyroidism 06/02/2022   Transaminitis 06/02/2022   Acute kidney injury superimposed on chronic kidney disease (HCC) 03/28/2022   Anasarca associated with disorder of kidney 03/27/2022   Leukopenia 03/27/2022   Aortic atherosclerosis (HCC) 03/27/2022   Pressure injury of skin 01/02/2022   Hypotension, unspecified 12/30/2021   Pneumonia due to COVID-19 virus 12/26/2021   Palliative care by specialist    Goals of care, counseling/discussion    General weakness    Sepsis (HCC) 12/22/2021   COVID-19 virus infection 12/22/2021   Paroxysmal atrial fibrillation (HCC) 12/22/2021   CKD (chronic kidney disease) stage 5, GFR less than 15 ml/min (HCC) 12/22/2021   Chronic anemia 12/22/2021   Prolonged QT interval 12/22/2021   Debility 11/20/2021   Metabolic acidosis 11/20/2021   Severe sepsis (HCC) 11/19/2021   Hypothermia 11/09/2021   Hyperlipidemia 11/09/2021   Chronic combined systolic and diastolic congestive heart failure (HCC) 11/09/2021   Pancytopenia (HCC) 11/09/2021   Severe protein-calorie malnutrition (HCC) 11/09/2021   Acute combined systolic and diastolic HF (heart failure) (HCC)    Acute exacerbation of CHF (congestive heart failure) (HCC) 03/06/2021   Essential hypertension    Hypertensive urgency 03/05/2021   Acute CHF (congestive heart failure) (HCC) 03/05/2021   Nausea and vomiting 03/05/2021   Elevated troponin level not due myocardial infarction 03/05/2021   Elevated d-dimer 03/05/2021   Acute congestive heart failure (HCC) 03/05/2021    Past  Surgical History:  Procedure Laterality Date   AV FISTULA PLACEMENT Left 03/27/2023   Procedure: LEFT ARM BRACHIOCEPHALIC ARTERIOVENOUS (AV) FISTULA CREATION;  Surgeon: [name], MD;  Location: [facility] OR;  Service: Vascular;  Laterality: Left;   CATARACT EXTRACTION, BILATERAL     combined systolic and diastolic congestive heart failure       INSERTION OF DIALYSIS CATHETER N/A 03/27/2023   Procedure: INSERTION OF RIGHT INTERNAL JUGULAR TUNNELED DIALYSIS CATHETER;  Surgeon: [name], MD;  Location: [facility] OR;  Service: Vascular;  Laterality: N/A;   IR THORACENTESIS ASP PLEURAL SPACE W/IMG GUIDE  06/12/2023    OB History   No obstetric history on file.      Home Medications    Prior to Admission medications   Medication Sig Start Date End Date Taking? Authorizing Provider  azithromycin (ZITHROMAX) 250 MG tablet Take first 2 tablets together, then 1 every day until finished. 12/09/23  Yes Cordarrel Stiefel A, [name]  carvedilol (COREG) 6.25 MG tablet Take 0.5 tablets (3.125 mg total) by mouth 2 (two) times daily with a meal. 05/09/23  Yes West, Katlyn D, NP  fluticasone (FLONASE) 50 MCG/ACT nasal spray Place 2 sprays into both nostrils daily. 12/09/23  Yes [name], [name]  furosemide (LASIX) 80 MG tablet Take 1 tablet (80 mg total) by mouth every Monday, Wednesday, Friday and Sunday. 11/20/23  Yes [name], [name], NP  isosorbide mononitrate (IMDUR) 30 MG 24 hr tablet Take 1 tablet (30 mg total) by mouth daily. 07/03/23  Yes [name], [name], NP  lidocaine-prilocaine (EMLA) cream APPLY 1 APPLICATION TOPICALLY AS NEEDED.(USE 30 MINUTES PRIOR TO DIALYSIS). 10/13/23  Yes [name], [name], NP  Menthol-Methyl Salicylate (MUSCLE RUB) 10-15 % CREA Apply 1 Application topically daily as needed for muscle pain.   Yes [provider]  multivitamin (RENA-VIT) TABS tablet Take 1 tablet by mouth daily. 06/19/23  Yes [name], [name], NP  sevelamer carbonate (RENVELA) 800 MG tablet Take 1 tablet (800 mg total) by mouth 3 (three) times daily with meals. 08/29/23  Yes [name], [name], NP  silver sulfADIAZINE (SILVADENE) 1 % cream Apply 1 Application topically daily. 12/02/23  Yes [name], [name]    Family History Family History  Problem Relation Age of Onset   Throat cancer Father     Social History Social  History   Tobacco Use   Smoking status: Never   Smokeless tobacco: Never  Vaping Use   Vaping status: Never Used  Substance Use Topics   Alcohol use: Not Currently   Drug use: Never     Allergies   Crestor [rosuvastatin] and Nitrofuran derivatives   Review of Systems Review of Systems  Constitutional:  Negative for chills and fever.  HENT:  Positive for congestion, rhinorrhea and sinus pressure. Negative for ear pain and sore throat.   Eyes:  Negative for pain and visual disturbance.  Respiratory:  Positive for cough. Negative for shortness of breath.   Cardiovascular:  Negative for chest pain and palpitations.  Gastrointestinal:  Negative for abdominal pain and vomiting.  Genitourinary:  Negative for dysuria and hematuria.  Musculoskeletal:  Negative for arthralgias and back pain.  Skin:  Negative for color change and rash.  Neurological:  Negative for seizures and syncope.  All other systems reviewed and are negative.    Physical Exam Triage Vital Signs ED Triage Vitals  Encounter Vitals Group     BP 12/09/23 1510 123/84     Systolic BP Percentile --  Diastolic BP Percentile --      Pulse Rate 12/09/23 1510 74     Resp 12/09/23 1510 16     Temp 12/09/23 1510 98.4 F (36.9 C)     Temp Source 12/09/23 1510 Oral     SpO2 12/09/23 1510 96 %     Weight 12/09/23 1509 106 lb (48.1 kg)     Height 12/09/23 1509 5\' 5"  (1.651 m)     Head Circumference --      Peak Flow --      Pain Score 12/09/23 1509 0     Pain Loc --      Pain Education --      Exclude from Growth Chart --    No data found.  Updated Vital Signs BP 123/84 (BP Location: Right Arm)   Pulse 74   Temp 98.4 F (36.9 C) (Oral)   Resp 16   Ht 5\' 5"  (1.651 m)   Wt 106 lb (48.1 kg)   SpO2 96%   BMI 17.64 kg/m   Visual Acuity Right Eye Distance:   Left Eye Distance:   Bilateral Distance:    Right Eye Near:   Left Eye Near:    Bilateral Near:     Physical Exam Vitals and nursing note  reviewed.  Constitutional:      General: She is not in acute distress.    Appearance: She is well-developed.  HENT:     Head: Normocephalic and atraumatic.  Eyes:     Conjunctiva/sclera: Conjunctivae normal.  Cardiovascular:     Rate and Rhythm: Normal rate and regular rhythm.     Heart sounds: No murmur heard. Pulmonary:     Effort: Pulmonary effort is normal. No tachypnea, accessory muscle usage or respiratory distress.     Breath sounds: Examination of the right-upper field reveals decreased breath sounds. Examination of the left-upper field reveals decreased breath sounds. Decreased breath sounds present. No wheezing, rhonchi or rales.  Abdominal:     Palpations: Abdomen is soft.     Tenderness: There is no abdominal tenderness.  Musculoskeletal:        General: No swelling.     Cervical back: Neck supple.  Skin:    General: Skin is warm and dry.     Capillary Refill: Capillary refill takes less than 2 seconds.  Neurological:     Mental Status: She is alert.  Psychiatric:        Mood and Affect: Mood normal.      UC Treatments / Results  Labs (all labs ordered are listed, but only abnormal results are displayed) Labs Reviewed  POC COVID19/FLU A&B COMBO    EKG   Radiology VAS [place] ABI WITH/WO TBI Result Date: 12/08/2023  LOWER EXTREMITY DOPPLER STUDY Patient Name:  DASHANA GUIZAR  Date of Exam:   12/08/2023 Medical Rec #: [id number]       Accession #:    [id number] Date of Birth: [birth year]      Patient Gender: F Patient Age:   8 years Exam Location:  [facility] Procedure:      VAS [place] ABI WITH/WO TBI Referring Phys: Ovid Curd --------------------------------------------------------------------------------  Indications: Claudication, and ulceration.  Performing Technologist: [place] Ponder RVS  Examination Guidelines: A complete evaluation includes at minimum, Doppler waveform signals and systolic blood pressure reading at the level of bilateral  brachial, anterior tibial, and posterior tibial arteries, when vessel segments are accessible. Bilateral testing is considered an integral part of a complete examination.  Photoelectric Plethysmograph (PPG) waveforms and toe systolic pressure readings are included as required and additional duplex testing as needed. Limited examinations for reoccurring indications may be performed as noted.  ABI Findings: +---------+------------------+-----+---------+--------+ Right    Rt Pressure (mmHg)IndexWaveform Comment  +---------+------------------+-----+---------+--------+ Brachial 144                                      +---------+------------------+-----+---------+--------+ PTA      255               1.77 biphasic          +---------+------------------+-----+---------+--------+ DP       255               1.77 triphasic         +---------+------------------+-----+---------+--------+ Great Toe142               0.99                   +---------+------------------+-----+---------+--------+ +--------+------------------+-----+--------+----------------+ Left    Lt Pressure (mmHg)IndexWaveformComment          +--------+------------------+-----+--------+----------------+ Brachial140                                             +--------+------------------+-----+--------+----------------+ PTA     251               1.74 biphasicaudibly biphasic +--------+------------------+-----+--------+----------------+ DP      255               1.77 biphasic                 +--------+------------------+-----+--------+----------------+ +-------+-----------+-----------+------------+------------+ ABI/TBIToday's ABIToday's TBIPrevious ABIPrevious TBI +-------+-----------+-----------+------------+------------+ Right  [place]         0.99                                +-------+-----------+-----------+------------+------------+ Left   [place]         [place]                                   +-------+-----------+-----------+------------+------------+  Summary: Right: Resting right ankle-brachial index indicates noncompressible right lower extremity arteries. The right toe-brachial index is normal. Left: Resting left ankle-brachial index indicates noncompressible left lower extremity arteries. The left toe-brachial index is normal. *See table(s) above for measurements and observations.  Electronically signed by Coral Else MD on 12/08/2023 at 4:10:48 PM.    Final     Procedures Procedures (including critical care time)  Medications Ordered in UC Medications - No data to display  Initial Impression / Assessment and Plan / UC Course  I have reviewed the triage vital signs and the nursing notes.  Pertinent labs & imaging results that were available during my care of the patient were reviewed by me and considered in my medical decision making (see chart for details).     Acute non-recurrent maxillary sinusitis  Influenza A   Flu A, flu B and COVID testing done today as likely exposure at dialysis.  Influenza A is positive but with your symptoms and physical exam findings we feel that there is also a overlying bacterial sinus infection and upper respiratory tract infection.  Reassuring symptoms are normal oxygen levels and otherwise stable vital signs.  We will treat with the following: Azithromycin 250mg  Take 2 tablets today and the 1 tablet daily for 4 more days.  Flonase 2 sprays each nostril once daily for 7 days for nasal congestion.  May use as needed after this. Rest when needed and stay hydrated. Return to urgent care or PCP if symptoms worsen or fail to resolve.    Final Clinical Impressions(s) / UC Diagnoses   Final diagnoses:  None   Discharge Instructions   None    ED Prescriptions     Medication Sig Dispense Auth. Provider   azithromycin (ZITHROMAX) 250 MG tablet Take first 2 tablets together, then 1 every day until finished. 6 tablet Kadeen Sroka A,  [name]   fluticasone (FLONASE) 50 MCG/ACT nasal spray Place 2 sprays into both nostrils daily. 9.9 mL [name], [place]      PDMP not reviewed this encounter.   [name], [place] 12/09/23 1610

## 2023-12-09 NOTE — Discharge Instructions (Addendum)
 Flu A, flu B and COVID testing done today as likely exposure at dialysis.  Influenza A is positive but with your symptoms and physical exam findings we feel that there is also a overlying bacterial sinus infection and upper respiratory tract infection.  Reassuring symptoms are normal oxygen levels and otherwise stable vital signs.  We will treat with the following: Azithromycin 250mg  Take 2 tablets today and the 1 tablet daily for 4 more days.  Flonase 2 sprays each nostril once daily for 7 days for nasal congestion.  May use as needed after this. Rest when needed and stay hydrated. Return to urgent care or PCP if symptoms worsen or fail to resolve.

## 2023-12-09 NOTE — Telephone Encounter (Signed)
 Patients daughter called stating her mother presented with vomiting episode x 1, gagging (both have now subsided) and a runny nose since last night and she would like to know what's ok for her to take OTC, that will not interfere with her current medications.  Patient denies, fever, body aches, sore throat, or cough.  Please advise

## 2023-12-09 NOTE — ED Triage Notes (Signed)
 Pt has some nasal congestion, coughing, and chest congestion. X2 days

## 2023-12-11 ENCOUNTER — Ambulatory Visit: Payer: Medicare (Managed Care) | Admitting: Podiatry

## 2023-12-11 DIAGNOSIS — L89620 Pressure ulcer of left heel, unstageable: Secondary | ICD-10-CM

## 2023-12-11 DIAGNOSIS — I739 Peripheral vascular disease, unspecified: Secondary | ICD-10-CM | POA: Diagnosis not present

## 2023-12-11 NOTE — Progress Notes (Signed)
  Subjective:  Patient ID: Kara Mullins, female    DOB: [birth year],  MRN: [id number]  Chief Complaint  Patient presents with   Wound Check    Left heel, pt stated that she rubbed a blister on the back of her heel on the 14th of this month and they wanted to have it checked    Discussed the use of AI scribe software for clinical note transcription with the patient, who gave verbal consent to proceed.  History of Present Illness   Kara Mullins is a 73 year old female who presents with a heel blister and ulceration.  She has developed a full-thickness ulceration with necrotic eschar on her heel, initially caused by friction during exercise. The blister was initially black and swollen but has not been painful. She denies any pain associated with the heel blister.  She is not diabetic and has undergone circulation testing, which revealed noncompressible arteries. This vascular condition may contribute to the poor healing of the heel ulcer. She has limited mobility but does not require a wheelchair.  She has been using a heel protector and applying Silvadene cream daily. Additionally, she uses a pillow to offload pressure from the heel and bandages it daily. Various protective methods have been employed, including Eucerin cream, silicone foam dressing, and gauze dressing with tape.          Objective:    Physical Exam   CARDIOVASCULAR: Pulses non palpable.   SKIN: Full thickness ulceration with necrotic eschar on posterolateral heel. No signs of infection around ulceration.           Results   Procedure: Wound care Description: Full thickness ulceration with necrotic eschar, posterolateral heel. No signs of infection. Hyperkeratosis present. No debridement today  DIAGNOSTIC ABI: noncompressible arteries on the left, flattened waveforms at the ankle      Assessment:   1. PAD (peripheral artery disease) (HCC)   2. Pressure injury of left heel, unstageable (HCC)      Plan:   Patient was evaluated and treated and all questions answered.  Assessment and Plan    Heel Ulcer Full thickness ulceration with necrotic eschar, no signs of infection. Likely due to pressure and friction. No pain reported. Non-diabetic. -Continue Silvadene cream daily. -Use foam dressing and heel protector boot. -Avoid direct pressure on the heel, especially during sleep and dialysis.  Peripheral Arterial Disease Non-palpable pulses and non-compressible arteries on ABI testing. Despite normal pressure readings, concern for underlying vascular disease due to arterial calcification. -Urgent referral to vascular surgery for further evaluation. -May need angiogram to assess for arterial blockages.  Follow-up Continue current wound care regimen and pressure off-loading strategies. Await vascular surgery consultation. F/U 4 weeks, may debride after next visit          Return in about 4 weeks (around 01/08/2024) for wound care.

## 2023-12-11 NOTE — Patient Instructions (Signed)
Call [phone] to schedule your vascular consultation:  [facility] [address], [place], [place] [address]

## 2023-12-13 ENCOUNTER — Other Ambulatory Visit: Payer: Self-pay

## 2023-12-13 ENCOUNTER — Inpatient Hospital Stay (HOSPITAL_COMMUNITY)
Admission: EM | Admit: 2023-12-13 | Discharge: 2023-12-17 | DRG: 871 | Disposition: A | Discharge status: Home / Self Care | Payer: Medicare (Managed Care) | Attending: Internal Medicine | Admitting: Internal Medicine

## 2023-12-13 ENCOUNTER — Inpatient Hospital Stay (HOSPITAL_COMMUNITY): Payer: Medicare (Managed Care)

## 2023-12-13 ENCOUNTER — Emergency Department (HOSPITAL_COMMUNITY): Payer: Medicare (Managed Care)

## 2023-12-13 ENCOUNTER — Encounter (HOSPITAL_COMMUNITY): Payer: Self-pay | Admitting: Internal Medicine

## 2023-12-13 DIAGNOSIS — J159 Unspecified bacterial pneumonia: Secondary | ICD-10-CM | POA: Diagnosis present

## 2023-12-13 DIAGNOSIS — I70202 Unspecified atherosclerosis of native arteries of extremities, left leg: Secondary | ICD-10-CM | POA: Diagnosis present

## 2023-12-13 DIAGNOSIS — I9589 Other hypotension: Secondary | ICD-10-CM | POA: Diagnosis not present

## 2023-12-13 DIAGNOSIS — A4189 Other specified sepsis: Principal | ICD-10-CM | POA: Diagnosis present

## 2023-12-13 DIAGNOSIS — D638 Anemia in other chronic diseases classified elsewhere: Secondary | ICD-10-CM | POA: Diagnosis not present

## 2023-12-13 DIAGNOSIS — G9341 Metabolic encephalopathy: Secondary | ICD-10-CM | POA: Diagnosis present

## 2023-12-13 DIAGNOSIS — F411 Generalized anxiety disorder: Secondary | ICD-10-CM | POA: Diagnosis present

## 2023-12-13 DIAGNOSIS — K8020 Calculus of gallbladder without cholecystitis without obstruction: Secondary | ICD-10-CM | POA: Diagnosis present

## 2023-12-13 DIAGNOSIS — J9601 Acute respiratory failure with hypoxia: Secondary | ICD-10-CM | POA: Diagnosis present

## 2023-12-13 DIAGNOSIS — I959 Hypotension, unspecified: Secondary | ICD-10-CM | POA: Diagnosis present

## 2023-12-13 DIAGNOSIS — J90 Pleural effusion, not elsewhere classified: Secondary | ICD-10-CM | POA: Diagnosis not present

## 2023-12-13 DIAGNOSIS — J111 Influenza due to unidentified influenza virus with other respiratory manifestations: Secondary | ICD-10-CM | POA: Diagnosis not present

## 2023-12-13 DIAGNOSIS — R911 Solitary pulmonary nodule: Secondary | ICD-10-CM | POA: Diagnosis present

## 2023-12-13 DIAGNOSIS — Z79899 Other long term (current) drug therapy: Secondary | ICD-10-CM

## 2023-12-13 DIAGNOSIS — R6520 Severe sepsis without septic shock: Secondary | ICD-10-CM | POA: Diagnosis not present

## 2023-12-13 DIAGNOSIS — N186 End stage renal disease: Secondary | ICD-10-CM | POA: Diagnosis present

## 2023-12-13 DIAGNOSIS — R1110 Vomiting, unspecified: Secondary | ICD-10-CM | POA: Diagnosis not present

## 2023-12-13 DIAGNOSIS — J1008 Influenza due to other identified influenza virus with other specified pneumonia: Secondary | ICD-10-CM | POA: Diagnosis present

## 2023-12-13 DIAGNOSIS — I132 Hypertensive heart and chronic kidney disease with heart failure and with stage 5 chronic kidney disease, or end stage renal disease: Secondary | ICD-10-CM | POA: Diagnosis present

## 2023-12-13 DIAGNOSIS — A419 Sepsis, unspecified organism: Secondary | ICD-10-CM | POA: Diagnosis present

## 2023-12-13 DIAGNOSIS — D696 Thrombocytopenia, unspecified: Secondary | ICD-10-CM | POA: Diagnosis present

## 2023-12-13 DIAGNOSIS — R4182 Altered mental status, unspecified: Secondary | ICD-10-CM | POA: Diagnosis present

## 2023-12-13 DIAGNOSIS — Z87891 Personal history of nicotine dependence: Secondary | ICD-10-CM

## 2023-12-13 DIAGNOSIS — I739 Peripheral vascular disease, unspecified: Secondary | ICD-10-CM | POA: Diagnosis not present

## 2023-12-13 DIAGNOSIS — Z888 Allergy status to other drugs, medicaments and biological substances status: Secondary | ICD-10-CM

## 2023-12-13 DIAGNOSIS — N83292 Other ovarian cyst, left side: Secondary | ICD-10-CM | POA: Diagnosis present

## 2023-12-13 DIAGNOSIS — Z808 Family history of malignant neoplasm of other organs or systems: Secondary | ICD-10-CM

## 2023-12-13 DIAGNOSIS — D61818 Other pancytopenia: Secondary | ICD-10-CM | POA: Diagnosis present

## 2023-12-13 DIAGNOSIS — L89624 Pressure ulcer of left heel, stage 4: Secondary | ICD-10-CM | POA: Diagnosis present

## 2023-12-13 DIAGNOSIS — I5042 Chronic combined systolic (congestive) and diastolic (congestive) heart failure: Secondary | ICD-10-CM | POA: Diagnosis present

## 2023-12-13 DIAGNOSIS — J9621 Acute and chronic respiratory failure with hypoxia: Secondary | ICD-10-CM | POA: Diagnosis present

## 2023-12-13 DIAGNOSIS — Z681 Body mass index (BMI) 19 or less, adult: Secondary | ICD-10-CM | POA: Diagnosis not present

## 2023-12-13 DIAGNOSIS — D631 Anemia in chronic kidney disease: Secondary | ICD-10-CM | POA: Diagnosis present

## 2023-12-13 DIAGNOSIS — R636 Underweight: Secondary | ICD-10-CM | POA: Diagnosis present

## 2023-12-13 DIAGNOSIS — Z1152 Encounter for screening for COVID-19: Secondary | ICD-10-CM

## 2023-12-13 DIAGNOSIS — R54 Age-related physical debility: Secondary | ICD-10-CM | POA: Diagnosis present

## 2023-12-13 DIAGNOSIS — Z992 Dependence on renal dialysis: Secondary | ICD-10-CM

## 2023-12-13 DIAGNOSIS — E785 Hyperlipidemia, unspecified: Secondary | ICD-10-CM | POA: Diagnosis present

## 2023-12-13 DIAGNOSIS — N2581 Secondary hyperparathyroidism of renal origin: Secondary | ICD-10-CM | POA: Diagnosis present

## 2023-12-13 DIAGNOSIS — J101 Influenza due to other identified influenza virus with other respiratory manifestations: Secondary | ICD-10-CM

## 2023-12-13 DIAGNOSIS — T68XXXA Hypothermia, initial encounter: Secondary | ICD-10-CM | POA: Diagnosis not present

## 2023-12-13 DIAGNOSIS — L97429 Non-pressure chronic ulcer of left heel and midfoot with unspecified severity: Secondary | ICD-10-CM | POA: Diagnosis not present

## 2023-12-13 DIAGNOSIS — L97409 Non-pressure chronic ulcer of unspecified heel and midfoot with unspecified severity: Secondary | ICD-10-CM | POA: Diagnosis present

## 2023-12-13 LAB — URINALYSIS, W/ REFLEX TO CULTURE (INFECTION SUSPECTED)
Bilirubin Urine: NEGATIVE
Glucose, UA: NEGATIVE mg/dL
Ketones, ur: NEGATIVE mg/dL
Nitrite: NEGATIVE
Protein, ur: 100 mg/dL — AB
Specific Gravity, Urine: 1.024 (ref 1.005–1.030)
WBC, UA: >50 WBC/hpf (ref 0–5)
pH: 7 (ref 5.0–8.0)

## 2023-12-13 LAB — CBC WITH DIFFERENTIAL/PLATELET
Abs Immature Granulocytes: 0.01 10*3/uL (ref 0.00–0.07)
Basophils Absolute: 0 10*3/uL (ref 0.0–0.1)
Basophils Relative: 0 %
Eosinophils Absolute: 0 10*3/uL (ref 0.0–0.5)
Eosinophils Relative: 1 %
HCT: 35.5 % — ABNORMAL LOW (ref 36.0–46.0)
Hemoglobin: 10.7 g/dL — ABNORMAL LOW (ref 12.0–15.0)
Immature Granulocytes: 1 %
Lymphocytes Relative: 22 %
Lymphs Abs: 0.4 10*3/uL — ABNORMAL LOW (ref 0.7–4.0)
MCH: 28.3 pg (ref 26.0–34.0)
MCHC: 30.1 g/dL (ref 30.0–36.0)
MCV: 93.9 fL (ref 80.0–100.0)
Monocytes Absolute: 0.3 10*3/uL (ref 0.1–1.0)
Monocytes Relative: 14 %
Neutro Abs: 1.3 10*3/uL — ABNORMAL LOW (ref 1.7–7.7)
Neutrophils Relative %: 62 %
Platelets: 106 10*3/uL — ABNORMAL LOW (ref 150–400)
RBC: 3.78 MIL/uL — ABNORMAL LOW (ref 3.87–5.11)
RDW: 15.8 % — ABNORMAL HIGH (ref 11.5–15.5)
WBC: 2 10*3/uL — ABNORMAL LOW (ref 4.0–10.5)
nRBC: 0 % (ref 0.0–0.2)

## 2023-12-13 LAB — RESP PANEL BY RT-PCR (RSV, FLU A&B, COVID)  RVPGX2
Influenza A by PCR: POSITIVE — AB
Influenza B by PCR: NEGATIVE
Resp Syncytial Virus by PCR: NEGATIVE
SARS Coronavirus 2 by RT PCR: NEGATIVE

## 2023-12-13 LAB — SEDIMENTATION RATE: Sed Rate: 25 mm/h — ABNORMAL HIGH (ref 0–22)

## 2023-12-13 LAB — COMPREHENSIVE METABOLIC PANEL
ALT: 27 U/L (ref 0–44)
AST: 50 U/L — ABNORMAL HIGH (ref 15–41)
Albumin: 2.6 g/dL — ABNORMAL LOW (ref 3.5–5.0)
Alkaline Phosphatase: 74 U/L (ref 38–126)
Anion gap: 10 (ref 5–15)
BUN: 26 mg/dL — ABNORMAL HIGH (ref 8–23)
CO2: 27 mmol/L (ref 22–32)
Calcium: 8.4 mg/dL — ABNORMAL LOW (ref 8.9–10.3)
Chloride: 99 mmol/L (ref 98–111)
Creatinine, Ser: 3.24 mg/dL — ABNORMAL HIGH (ref 0.44–1.00)
GFR, Estimated: 15 mL/min — ABNORMAL LOW (ref >60)
Glucose, Bld: 94 mg/dL (ref 70–99)
Potassium: 4 mmol/L (ref 3.5–5.1)
Sodium: 136 mmol/L (ref 135–145)
Total Bilirubin: 0.7 mg/dL (ref 0.0–1.2)
Total Protein: 7 g/dL (ref 6.5–8.1)

## 2023-12-13 LAB — PROTIME-INR
INR: 1.1 (ref 0.8–1.2)
Prothrombin Time: 14.2 s (ref 11.4–15.2)

## 2023-12-13 LAB — HEPATITIS B SURFACE ANTIGEN: Hepatitis B Surface Ag: REACTIVE — AB

## 2023-12-13 LAB — C-REACTIVE PROTEIN: CRP: 8.8 mg/dL — ABNORMAL HIGH (ref <1.0)

## 2023-12-13 LAB — APTT: aPTT: 40 s — ABNORMAL HIGH (ref 24–36)

## 2023-12-13 LAB — AMMONIA: Ammonia: 32 umol/L (ref 9–35)

## 2023-12-13 LAB — LIPASE, BLOOD: Lipase: 31 U/L (ref 11–51)

## 2023-12-13 LAB — PROCALCITONIN: Procalcitonin: 0.94 ng/mL

## 2023-12-13 LAB — I-STAT CG4 LACTIC ACID, ED: Lactic Acid, Venous: 0.7 mmol/L (ref 0.5–1.9)

## 2023-12-13 LAB — BRAIN NATRIURETIC PEPTIDE: B Natriuretic Peptide: 443 pg/mL — ABNORMAL HIGH (ref 0.0–100.0)

## 2023-12-13 MED ORDER — GUAIFENESIN ER 600 MG PO TB12
600.0000 mg | ORAL_TABLET | Freq: Two times a day (BID) | ORAL | Status: DC
  Administered 2023-12-15 – 2023-12-16 (×2): 600 mg via ORAL
  Filled 2023-12-13 (×4): qty 1

## 2023-12-13 MED ORDER — SODIUM CHLORIDE 0.9% FLUSH
3.0000 mL | Freq: Two times a day (BID) | INTRAVENOUS | Status: DC
  Administered 2023-12-13 – 2023-12-17 (×9): 3 mL via INTRAVENOUS

## 2023-12-13 MED ORDER — SODIUM CHLORIDE 0.9 % IV SOLN
2.0000 g | Freq: Once | INTRAVENOUS | Status: AC
  Administered 2023-12-13: 2 g via INTRAVENOUS
  Filled 2023-12-13: qty 12.5

## 2023-12-13 MED ORDER — VANCOMYCIN HCL 500 MG/100ML IV SOLN
500.0000 mg | INTRAVENOUS | Status: DC
  Administered 2023-12-13: 500 mg via INTRAVENOUS
  Filled 2023-12-13: qty 100

## 2023-12-13 MED ORDER — MUSCLE RUB 10-15 % EX CREA: 1.0000 | TOPICAL_CREAM | Freq: Every day | CUTANEOUS | Status: DC | PRN

## 2023-12-13 MED ORDER — RENA-VITE PO TABS
1.0000 | ORAL_TABLET | ORAL | Status: DC
  Administered 2023-12-15: 1 via ORAL
  Filled 2023-12-13: qty 1

## 2023-12-13 MED ORDER — VANCOMYCIN HCL IN DEXTROSE 1-5 GM/200ML-% IV SOLN
1000.0000 mg | Freq: Once | INTRAVENOUS | Status: AC
  Administered 2023-12-13: 1000 mg via INTRAVENOUS
  Filled 2023-12-13: qty 200

## 2023-12-13 MED ORDER — HEPARIN SODIUM (PORCINE) 5000 UNIT/ML IJ SOLN
5000.0000 [IU] | Freq: Three times a day (TID) | INTRAMUSCULAR | Status: DC
  Administered 2023-12-13 – 2023-12-17 (×11): 5000 [IU] via SUBCUTANEOUS
  Filled 2023-12-13 (×11): qty 1

## 2023-12-13 MED ORDER — ACETAMINOPHEN 650 MG RE SUPP
650.0000 mg | Freq: Four times a day (QID) | RECTAL | Status: DC | PRN
Start: 2023-12-13 — End: 2023-12-17

## 2023-12-13 MED ORDER — METRONIDAZOLE 500 MG/100ML IV SOLN
500.0000 mg | Freq: Once | INTRAVENOUS | Status: AC
  Administered 2023-12-13: 500 mg via INTRAVENOUS
  Filled 2023-12-13: qty 100

## 2023-12-13 MED ORDER — ACETAMINOPHEN 325 MG PO TABS
650.0000 mg | ORAL_TABLET | Freq: Four times a day (QID) | ORAL | Status: DC | PRN
Start: 2023-12-13 — End: 2023-12-17

## 2023-12-13 MED ORDER — ALBUTEROL SULFATE (2.5 MG/3ML) 0.083% IN NEBU: 2.5000 mg | INHALATION_SOLUTION | RESPIRATORY_TRACT | Status: DC | PRN

## 2023-12-13 MED ORDER — CHLORHEXIDINE GLUCONATE CLOTH 2 % EX PADS
6.0000 | MEDICATED_PAD | Freq: Every day | CUTANEOUS | Status: DC
  Administered 2023-12-14 – 2023-12-17 (×4): 6 via TOPICAL

## 2023-12-13 MED ORDER — SEVELAMER CARBONATE 800 MG PO TABS
800.0000 mg | ORAL_TABLET | Freq: Three times a day (TID) | ORAL | Status: DC
  Filled 2023-12-13 (×2): qty 1

## 2023-12-13 MED ORDER — SODIUM CHLORIDE 0.9 % IV SOLN
2.0000 g | INTRAVENOUS | Status: DC
  Administered 2023-12-13 – 2023-12-15 (×3): 2 g via INTRAVENOUS
  Filled 2023-12-13 (×3): qty 20

## 2023-12-13 MED ORDER — LIDOCAINE-PRILOCAINE 2.5-2.5 % EX CREA
TOPICAL_CREAM | CUTANEOUS | Status: DC
  Filled 2023-12-13: qty 5

## 2023-12-13 MED ORDER — NEPRO/CARBSTEADY PO LIQD
237.0000 mL | Freq: Two times a day (BID) | ORAL | Status: DC
  Administered 2023-12-14 – 2023-12-15 (×2): 237 mL via ORAL

## 2023-12-13 MED ORDER — LIDOCAINE-PRILOCAINE 2.5-2.5 % EX CREA
1.0000 | TOPICAL_CREAM | CUTANEOUS | Status: DC | PRN
  Filled 2023-12-13: qty 5

## 2023-12-13 MED ORDER — SILVER SULFADIAZINE 1 % EX CREA
1.0000 | TOPICAL_CREAM | Freq: Every day | CUTANEOUS | Status: DC
  Administered 2023-12-13 – 2023-12-15 (×3): 1 via TOPICAL
  Filled 2023-12-13: qty 85

## 2023-12-13 MED ORDER — SODIUM CHLORIDE 0.9 % IV BOLUS
500.0000 mL | Freq: Once | INTRAVENOUS | Status: AC
  Administered 2023-12-13: 500 mL via INTRAVENOUS

## 2023-12-13 MED ORDER — FLUTICASONE PROPIONATE 50 MCG/ACT NA SUSP
2.0000 | Freq: Every day | NASAL | Status: DC
  Administered 2023-12-13 – 2023-12-17 (×3): 2 via NASAL
  Filled 2023-12-13: qty 16

## 2023-12-13 NOTE — Progress Notes (Addendum)
 Pharmacy Antibiotic Note  Breanna Shorkey is a 73 y.o. female admitted on 12/13/2023 with  concern for infection from pneumonia or left heel ulcer .  Pharmacy has been consulted for vancomycin dosing. Of note, she is an HD patient with current schedule set for TTS per nephrology.   Patient's WBC is very low at 2.0k, afebrile. Her weight is low at 47.6 kg on last measurement. Patient has not yet had dialysis today, 3/1.    Plan: Give vancomycin 500mg  after each dialysis session only on dialysis days  Continue ceftriaxone 2g q24 hours  Monitor signs and symptoms of infection  Follow up length of therapy   Weight: 47.6 kg (105 lb)  Temp (24hrs), Avg:97.5 F (36.4 C), Min:94.7 F (34.8 C), Max:99.3 F (37.4 C)  Recent Labs  Lab 12/13/23 0044 12/13/23 0238  WBC 2.0*  --   CREATININE 3.24*  --   LATICACIDVEN  --  0.7    Estimated Creatinine Clearance: 12 mL/min (A) (by C-G formula based on SCr of 3.24 mg/dL (H)).    Allergies  Allergen Reactions   Crestor [Rosuvastatin] Shortness Of Breath   Nitrofuran Derivatives Other (See Comments)    Unknown reaction    Antimicrobials this admission: Metronidazole 3/1 x 1 dose Cefepime 2g 3/1 x 1 dose Vancomycin 1g 3/1 x 1 dose  Ceftriaxone 3/1 >>  Dose adjustments this admission: N/A  Microbiology results: 3/1 BCx: NG < 12 hours 3/1 UCx: NG < 12 hours  3/1 Resp Panel: positive for flu A  3/1 MRSA PCR: sent  Thank you for allowing pharmacy to be a part of this patient's care.  Blane Ohara, PharmD, BCPS PGY2 Pharmacy Resident

## 2023-12-13 NOTE — H&P (Signed)
 History and Physical    Patient: Kara Mullins HEN:[id number] DOB: 1950-10-18 DOA: 12/13/2023 DOS: the patient was seen and examined on 12/13/2023 PCP: [name], [name], NP  Patient coming from: Home via EMS  Chief Complaint:  Chief Complaint  Patient presents with   Altered Mental Status    Upon fire arrival pt was lethargic and had AMS. Pt oxygen was in 60s. Pt had dialysis yesterday. Pt has wound on left heel and was dx with Flu A three days ago. Crackles heard on lung sounds. Pt currently 97% on 2L.    HPI: Kara Mullins is a 73 y.o. female with medical history significant of hypertension, hyperlipidemia, combined systolic and diastolic CHF, ESRD on HD (TTS), anemia of chronic disease, and anxiety who presented after being noted to be acutely altered.  She does not recall the events leading up to the ambulance picking her up, as she was asleep at the time. Her daughter reported that she appeared unusually sleepy and was not waking up, prompting the call to emergency services. She is not typically on oxygen at baseline.  Recently, she was diagnosed with the flu on February 25th after visiting urgent care for a runny nose. She was prescribed antibiotics and a nasal spray, as she cannot take Tamiflu due to her dialysis. She has been compliant with her antibiotic regimen, taking two doses on the first day and one dose each subsequent day. She reports a decrease in appetite and an inability to taste food since her flu diagnosis, although she continues to try to eat. No cough, headache, muscle aches, nausea, or vomiting were noted. Her daughter noted that she was complaining of stomach pain and had a couple of bowel movements before feeling temporary better. However, after eating and taking her medication, she became excessively sleepy, which was unusual for her.  She has a history of dialysis, which she undergoes on Tuesdays, Thursdays, and Saturdays. She has not missed any dialysis sessions  recently.   She mentions an ulcer on her left foot, which she attributes to rubbing it while exercising. She has been following up with podiatry for this issue, and it was noted to be healing during her last visit on Thursday.  In the ED patient was noted to be hypothermic with temperature 94.7 F, pulse 54-59, blood pressures as low as 70/46-102/62, and O2 saturations currently maintained on 2 L of nasal cannula oxygen.  She was placed on [employer]  Labs significant for WBC 2, hemoglobin 10.7, platelets 106, potassium 4, BUN 26, creatinine 3.24, calcium 8.4, albumin 2.6, AST 50, and lactic acid 0.7. Chest x-ray noted a trace to small loculated right pleural effusion and a small left pleural effusion.  Influenza A screening was still positive.  CT imaging of the head did not note any acute abnormality.  CT imaging of the chest abdomen pelvis noted small bilateral pleural effusions with chronic rounded atelectasis and/or superimposed multifocal pneumonia, subpleural nodule measuring 2.5 x 1.7 cm in the lingula, findings concerning for anasarca, cholelithiasis, and stable appearance of left adnexal cyst.  Blood cultures have been obtained.  Patient had initially been given 500 mL bolus of normal saline, vancomycin, metronidazole, and cefepime.  Review of Systems: As mentioned in the history of present illness. All other systems reviewed and are negative. Past Medical History:  Diagnosis Date   Chronic combined systolic and diastolic congestive heart failure (HCC) 11/09/2021   Generalized anxiety disorder    Hyperlipidemia 11/09/2021   Hypertension  Stage 3b chronic kidney disease (CKD) (HCC) 11/09/2021   Past Surgical History:  Procedure Laterality Date   AV FISTULA PLACEMENT Left 03/27/2023   Procedure: LEFT ARM BRACHIOCEPHALIC ARTERIOVENOUS (AV) FISTULA CREATION;  Surgeon: [name], MD;  Location: [facility] OR;  Service: Vascular;  Laterality: Left;   CATARACT EXTRACTION, BILATERAL      combined systolic and diastolic congestive heart failure      INSERTION OF DIALYSIS CATHETER N/A 03/27/2023   Procedure: INSERTION OF RIGHT INTERNAL JUGULAR TUNNELED DIALYSIS CATHETER;  Surgeon: [name], MD;  Location: [facility] OR;  Service: Vascular;  Laterality: N/A;   IR THORACENTESIS ASP PLEURAL SPACE W/IMG GUIDE  06/12/2023   Social History:  reports that she has never smoked. She has never used smokeless tobacco. She reports that she does not currently use alcohol. She reports that she does not use drugs.  Allergies  Allergen Reactions   Crestor [Rosuvastatin] Shortness Of Breath   Nitrofuran Derivatives Other (See Comments)    Unknown reaction    Family History  Problem Relation Age of Onset   Throat cancer Father     Prior to Admission medications   Medication Sig Start Date End Date Taking? Authorizing Provider  azithromycin (ZITHROMAX) 250 MG tablet Take first 2 tablets together, then 1 every day until finished. 12/09/23  Yes White, Elizabeth A, [name]  carvedilol (COREG) 3.125 MG tablet Take 3.125 mg by mouth 2 (two) times daily.   Yes [provider]  fluticasone (FLONASE) 50 MCG/ACT nasal spray Place 2 sprays into both nostrils daily. 12/09/23  Yes [name], [name]  furosemide (LASIX) 80 MG tablet Take 1 tablet (80 mg total) by mouth every Monday, Wednesday, Friday and Sunday. 11/20/23  Yes [name], [name], NP  isosorbide mononitrate (IMDUR) 30 MG 24 hr tablet Take 1 tablet (30 mg total) by mouth daily. Patient taking differently: Take 30 mg by mouth See admin instructions. Take 1 tablet by mouth every Mon, Wed, Fri, Sat 07/03/23  Yes [name], [name], NP  Menthol-Methyl Salicylate (MUSCLE RUB) 10-15 % CREA Apply 1 Application topically daily as needed for muscle pain.   Yes [provider]  multivitamin (RENA-VIT) TABS tablet Take 1 tablet by mouth daily. Patient taking differently: Take 1 tablet by mouth See admin instructions. Take 1 tablet by  mouth every Mon, Wed, Fri, Sat 06/19/23  Yes [name], [name], NP  OVER THE COUNTER MEDICATION Take 1 fluid ounce by mouth daily. Liquid Cell Protein   Yes [provider]  silver sulfADIAZINE (SILVADENE) 1 % cream Apply 1 Application topically daily. 12/02/23  Yes [name], [name]  lidocaine-prilocaine (EMLA) cream APPLY 1 APPLICATION TOPICALLY AS NEEDED.(USE 30 MINUTES PRIOR TO DIALYSIS). Patient taking differently: Apply 1 Application topically as needed (for port access). 10/13/23   [name], [name], NP  sevelamer carbonate (RENVELA) 800 MG tablet Take 1 tablet (800 mg total) by mouth 3 (three) times daily with meals. Patient not taking: Reported on 12/13/2023 08/29/23   Caesar Bookman, NP    Physical Exam: Vitals:   12/13/23 0545 12/13/23 0600 12/13/23 0615 12/13/23 0700  BP: (!) 97/58 99/61 98/62  (!) 97/59  Pulse: (!) 55 (!) 57 (!) 58 (!) 59  Resp: 16 (!) 25 19 17   Temp:    99.1 F (37.3 C)  TempSrc:    Oral  SpO2: 100% 100% 100% 100%  Weight:        Constitutional: Elderly female NAD, calm, comfortable Eyes: PERRL, lids  and conjunctivae normal ENMT: Mucous membranes are moist.  .Normal dentition.  Neck: normal, supple, no masses,   Respiratory: Some intermittent crackles appreciated the mid to lower lung fields. Cardiovascular: Regular rate and rhythm, no murmurs / rubs / gallops. No extremity edema.  Left upper extremity fistula in place. Abdomen: no tenderness, no masses palpated.  Bowel sounds positive.  Musculoskeletal: no clubbing / cyanosis. No joint deformity upper and lower extremities. Good ROM, no contractures. Normal muscle tone.  Skin: no rashes, lesions, ulcers. No induration Neurologic: CN 2-12 grossly intact.  Strength 5/5 in all 4.  Psychiatric: Normal judgment and insight. Alert and oriented x person place and time.  Normal mood.   Data Reviewed:  EKG reveals sinus rhythm at 60 bpm with likely left atrial enlargement and borderline left axis  deviation.  Reviewed labs, imaging, and pertinent records as documented.  Assessment and Plan:   SIRS/sepsis Acute.  On presentation patient noted to be hypothermic with temperature 94.7 F white blood cell count 2.  Lactic acid was reassuring at 0.7.  Blood cultures have been obtained.  Patient had received a bolus of 500 mL normal saline IV fluids, vancomycin, metronidazole, and cefepime.  Question possibility of pneumonia/infected pleural effusion versus infected heel ulcer. -Admit to a progressive bed -Follow-up blood cultures as well as urine cultures if able to be obtained -Check MRSA screen  -Check procalcitonin(0.91) -Continue vancomycin and Rocephin.  De-escalate antibiotics when appropriate  Acute respiratory failure with hypoxia Pleural effusion Patient noted to have O2 saturations as low as 60s.  Chest x-ray noting a trace to small left related right pleural effusion and a small loculated left pleural effusion. -Continuous pulse oximetry with oxygen maintain O2 saturation greater than 92%  Influenza A Initially diagnosed on 2/25 and recheck still positive on 3/1.  Patient not a candidate for Tamiflu.  She had been started on azithromycin and have 1 day left of medication to take. -Continue supportive care  Hypotension Acute.  Initial blood pressures noted to be as low as 70/46.  Patient had last taken her home blood pressure medications yesterday. -Hold home blood pressure medicines   ESRD on HD On physical exam patient does not appear grossly fluid overloaded.  Patient normally dialyzes on Tuesday, Thursday, Saturday.  Denies any recent missed dialysis sessions.  Potassium noted to be 4, BUN 26, creatinine 3.24. -Add on BNP(443) -Continue current medication regimen -Nephrology consulted for need of hemodialysis.  Acute metabolic encephalopathy Patient was noted to be acutely altered.  CT scan of the head did not note any acute abnormality.  Thought secondary to poor p.o.  intake. -Neurochecks -Check ammonia level  Left heel ulcer Peripheral vascular disease Thought secondary to due to friction injury from using exercise equipment. Followed by podiatry, with recent visit where they are concerned more so for peripheral vascular disease. -Check ESR and CRP -Check x-ray of left foot -Prevalon boots -Continue silver Silvadene cream daily -Continue outpatient follow-up with podiatry -Antibiotics as noted above  Anemia of chronic disease Hemoglobin 10.7 which appears relatively stable. -Continue to monitor  Thrombocytopenia Acute.  Platelet count 106 -Continue to monitor  DVT prophylaxis: SCDs Advance Care Planning:   Code Status: Full Code    Consults: Nephrology  Family Communication: Daughter updated at bedside  Severity of Illness: The appropriate patient status for this patient is INPATIENT. Inpatient status is judged to be reasonable and necessary in order to provide the required intensity of service to ensure the patient's safety. The patient's  presenting symptoms, physical exam findings, and initial radiographic and laboratory data in the context of their chronic comorbidities is felt to place them at high risk for further clinical deterioration. Furthermore, it is not anticipated that the patient will be medically stable for discharge from the hospital within 2 midnights of admission.   * I certify that at the point of admission it is my clinical judgment that the patient will require inpatient hospital care spanning beyond 2 midnights from the point of admission due to high intensity of service, high risk for further deterioration and high frequency of surveillance required.*  Author: Clydie Braun, MD 12/13/2023 7:28 AM  For on call review [email or web address].

## 2023-12-13 NOTE — Consult Note (Signed)
 ESRD Consult Note  Reason for consult: ESRD, provision of dialysis  Assessment/Recommendations:  ESRD  -outpatient HD orders: [place].  TTS.  3 hours 45 minutes.  F180.  EDW 47 kg.  aVF, 16-gauge.  Flow rates: 350/autoflow 1.5.  3K, 3 calcium.  Heparin: 4000 units bolus.  Meds: None for now.  Last dose of Mircera was 75 mcg on 2/11 -HD today per TTS sched  Sepsis -Antibiotics per primary  Acute hypoxic respiratory failure -Likely secondary to pneumonia.  Antibiotics per primary as above. Will UF as tolerated  Influenza A -Initially diagnosed in 2/25.  Per primary  Acute metabolic encephalopathy -Per primary  Volume/ hypertension  -UF as tolerated esp given her soft BPs  Anemia of Chronic Kidney Disease Pancytopenia Hemoglobin 10.7.  Not currently receiving any ESA's at the moment as an outpatient.  Monitor for now.  Checking FE panel.  Pancytopenia workup per primary service -Transfuse PRN for Hgb <7  Secondary Hyperparathyroidism/Hyperphosphatemia -Resume home binders.  Monitor phos  Recommendations were discussed with the primary team.  Anthony Sar, MD [facility]  History of Present Illness: Kara Mullins is a/an 73 y.o. female with a past medical history of ESRD, hypertension, combined systolic and diastolic CHF, hyperlipidemia, chronic anemia, anxiety who presents with altered mental status.  She was recently found to be influenza A positive on 2/25.  She was hypothermic in the ER with temp of 94.7.  She was also with a blood pressure of 70/46 to low 100 systolic in the ER.  She is found to be pancytopenic.  She underwent a chest x-ray which showed a trace left pleural effusion.  She did undergo CT imaging of her chest abdomen pelvis which showed multifocal pneumonia, subpleural nodule in the lingula, anasarca, cholelithiasis.  CT head did not show any acute abnormalities.  She did receive 500 cc bolus of normal saline along with broad-spectrum antibiotics.   Patient seen and examined bedside. She reports that she feels okay, breathing is stable. No other complaints.   Medications:  Current Facility-Administered Medications  Medication Dose Route Frequency Provider Last Rate Last Admin   acetaminophen (TYLENOL) tablet 650 mg  650 mg Oral Q6H PRN Clydie Braun, MD       Or   acetaminophen (TYLENOL) suppository 650 mg  650 mg Rectal Q6H PRN Katrinka Blazing, Rondell A, MD       albuterol (PROVENTIL) (2.5 MG/3ML) 0.083% nebulizer solution 2.5 mg  2.5 mg Nebulization Q2H PRN Katrinka Blazing, Rondell A, MD       fluticasone (FLONASE) 50 MCG/ACT nasal spray 2 spray  2 spray Each Nare Daily Smith, Rondell A, MD       guaiFENesin (MUCINEX) 12 hr tablet 600 mg  600 mg Oral BID Katrinka Blazing, Rondell A, MD       heparin injection 5,000 Units  5,000 Units Subcutaneous Q8H Madelyn Flavors A, MD   5,000 Units at 12/13/23 0844   lidocaine-prilocaine (EMLA) cream 1 Application  1 Application Topical PRN Clydie Braun, MD       [START ON 12/15/2023] multivitamin (RENA-VIT) tablet 1 tablet  1 tablet Oral Once per day on Monday Wednesday Friday Saturday Clydie Braun, MD       Muscle Rub CREA 1 Application  1 Application Topical Daily PRN Madelyn Flavors A, MD       sevelamer carbonate (RENVELA) tablet 800 mg  800 mg Oral TID WC Smith, Rondell A, MD       silver sulfADIAZINE (SILVADENE) 1 %  cream 1 Application  1 Application Topical Daily Smith, Rondell A, MD       sodium chloride flush (NS) 0.9 % injection 3 mL  3 mL Intravenous Q12H Smith, Rondell A, MD   3 mL at 12/13/23 1119   Current Outpatient Medications  Medication Sig Dispense Refill   azithromycin (ZITHROMAX) 250 MG tablet Take first 2 tablets together, then 1 every day until finished. 6 tablet 0   carvedilol (COREG) 3.125 MG tablet Take 3.125 mg by mouth 2 (two) times daily.     fluticasone (FLONASE) 50 MCG/ACT nasal spray Place 2 sprays into both nostrils daily. 9.9 mL 2   furosemide (LASIX) 80 MG tablet Take 1 tablet (80 mg  total) by mouth every Monday, Wednesday, Friday and Sunday. 48 tablet 0   isosorbide mononitrate (IMDUR) 30 MG 24 hr tablet Take 1 tablet (30 mg total) by mouth daily. (Patient taking differently: Take 30 mg by mouth See admin instructions. Take 1 tablet by mouth every Mon, Wed, Fri, Sat) 90 tablet 1   Menthol-Methyl Salicylate (MUSCLE RUB) 10-15 % CREA Apply 1 Application topically daily as needed for muscle pain.     multivitamin (RENA-VIT) TABS tablet Take 1 tablet by mouth daily. (Patient taking differently: Take 1 tablet by mouth See admin instructions. Take 1 tablet by mouth every Mon, Wed, Fri, Sat) 90 tablet 1   OVER THE COUNTER MEDICATION Take 1 fluid ounce by mouth daily. Liquid Cell Protein     silver sulfADIAZINE (SILVADENE) 1 % cream Apply 1 Application topically daily. 50 g 0   lidocaine-prilocaine (EMLA) cream APPLY 1 APPLICATION TOPICALLY AS NEEDED.(USE 30 MINUTES PRIOR TO DIALYSIS). (Patient taking differently: Apply 1 Application topically as needed (for port access).) 30 g 1   sevelamer carbonate (RENVELA) 800 MG tablet Take 1 tablet (800 mg total) by mouth 3 (three) times daily with meals. (Patient not taking: Reported on 12/13/2023) 270 tablet 3     ALLERGIES Crestor [rosuvastatin] and Nitrofuran derivatives  MEDICAL HISTORY Past Medical History:  Diagnosis Date   Chronic combined systolic and diastolic congestive heart failure (HCC) 11/09/2021   Generalized anxiety disorder    Hyperlipidemia 11/09/2021   Hypertension    Stage 3b chronic kidney disease (CKD) (HCC) 11/09/2021     SOCIAL HISTORY Social History   Socioeconomic History   Marital status: Divorced    Spouse name: Not on file   Number of children: 1   Years of education: Not on file   Highest education level: Not on file  Occupational History   Occupation: retired  Tobacco Use   Smoking status: Never   Smokeless tobacco: Never  Vaping Use   Vaping status: Never Used  Substance and Sexual Activity    Alcohol use: Not Currently   Drug use: Never   Sexual activity: Not Currently  Other Topics Concern   Not on file  Social History Narrative   ** Merged History Encounter **       Tobacco use, amount per day now: N/A Past tobacco use, amount per day: N/A How many years did you use tobacco: N/A Alcohol use (drinks per week): N/A Diet: Do you drink/eat things with caffeine: Coffee Marital status: Divorced                                    What year were you married? 1992 Do you live in a house, apartment, assisted living,  condo, trailer, etc.? Is it one or more stories? How many persons live in your home? 2 Do you have pets in your home?( please list) No Highest Level of e   ducation completed? High School Current or past profession: Restaurant Employee Do you exercise?   Yes                               Type and how often? Walk Do you have a living will? No Do you have a DNR form?    No                               If    not, do you want to discuss one? Do you have signed POA/HPOA forms? No                       If so, please bring to you appointment  Do you have any difficulty bathing or dressing yourself? No Do you have any difficulty preparing food or eating? No    Do you have any difficulty managing your medications? No Do you have any difficulty managing your finances? No Do you have any difficulty affording your medications? No   Social Drivers of Corporate investment banker Strain: Low Risk  (06/11/2022)   Overall Financial Resource Strain (CARDIA)    Difficulty of Paying Living Expenses: Not very hard  Food Insecurity: No Food Insecurity (12/13/2023)   Hunger Vital Sign    Worried About Running Out of Food in the Last Year: Never true    Ran Out of Food in the Last Year: Never true  Transportation Needs: No Transportation Needs (12/13/2023)   PRAPARE - Administrator, Civil Service (Medical): No    Lack of Transportation (Non-Medical): No  Physical  Activity: Not on file  Stress: Not on file  Social Connections: Moderately Isolated (12/13/2023)   Social Connection and Isolation Panel [NHANES]    Frequency of Communication with Friends and Family: More than three times a week    Frequency of Social Gatherings with Friends and Family: More than three times a week    Attends Religious Services: Never    Database administrator or Organizations: No    Attends Engineer, structural: 1 to 4 times per year    Marital Status: Divorced  Catering manager Violence: Not At Risk (12/13/2023)   Humiliation, Afraid, Rape, and Kick questionnaire    Fear of Current or Ex-Partner: No    Emotionally Abused: No    Physically Abused: No    Sexually Abused: No     FAMILY HISTORY Family History  Problem Relation Age of Onset   Throat cancer Father      Review of Systems: 12 systems were reviewed and negative except per HPI  Physical Exam: Vitals:   12/13/23 0808 12/13/23 1115  BP:  (!) 107/55  Pulse: 62 (!) 57  Resp: (!) 23 (!) 21  Temp: 99.3 F (37.4 C)   SpO2: 94% 100%   No intake/output data recorded.  Intake/Output Summary (Last 24 hours) at 12/13/2023 1200 Last data filed at 12/13/2023 0400 Gross per 24 hour  Intake 300 ml  Output --  Net 300 ml   General: no acute distress HEENT: anicteric sclera, MMM CV: normal rate, no murmurs, no edema Lungs: Decreased breath sounds of  a basilar, normal wob Abd: soft, non-tender, non-distended Skin: no visible lesions or rashes Psych: alert, engaged, appropriate mood and affect Neuro: normal speech, no gross focal deficits  Dialysis access: LUE AVF+b/t  Test Results Reviewed Lab Results  Component Value Date   NA 136 12/13/2023   K 4.0 12/13/2023   CL 99 12/13/2023   CO2 27 12/13/2023   BUN 26 (H) 12/13/2023   CREATININE 3.24 (H) 12/13/2023   GLU 110 01/22/2023   CALCIUM 8.4 (L) 12/13/2023   ALBUMIN 2.6 (L) 12/13/2023   PHOS 3.4 04/01/2023    I have reviewed relevant  outside healthcare records

## 2023-12-13 NOTE — ED Provider Notes (Signed)
 [facility]PT [facility] Emergency Department Provider Note MRN:  [id number]  Arrival date & time: 12/13/23     Chief Complaint   Altered Mental Status (Upon fire arrival pt was lethargic and had AMS. Pt oxygen was in 60s. Pt had dialysis yesterday. Pt has wound on left heel and was dx with Flu A three days ago. Crackles heard on lung sounds. Pt currently 97% on 2L. )   History of Present Illness   Kara Mullins is a 73 y.o. year-old female with a history of CKD, hypertension, heart failure presenting to the ED with chief complaint of altered mental status.  Patient has been sick with the flu for 3 to 4 days.  More confused this evening, hypoxic with EMS.  Review of Systems  A thorough review of systems was obtained and all systems are negative except as noted in the HPI and PMH.   Patient's Health History    Past Medical History:  Diagnosis Date   Chronic combined systolic and diastolic congestive heart failure (HCC) 11/09/2021   Generalized anxiety disorder    Hyperlipidemia 11/09/2021   Hypertension    Stage 3b chronic kidney disease (CKD) (HCC) 11/09/2021    Past Surgical History:  Procedure Laterality Date   AV FISTULA PLACEMENT Left 03/27/2023   Procedure: LEFT ARM BRACHIOCEPHALIC ARTERIOVENOUS (AV) FISTULA CREATION;  Surgeon: [name], MD;  Location: [facility] OR;  Service: Vascular;  Laterality: Left;   CATARACT EXTRACTION, BILATERAL     combined systolic and diastolic congestive heart failure      INSERTION OF DIALYSIS CATHETER N/A 03/27/2023   Procedure: INSERTION OF RIGHT INTERNAL JUGULAR TUNNELED DIALYSIS CATHETER;  Surgeon: [name], MD;  Location: [facility] OR;  Service: Vascular;  Laterality: N/A;   IR THORACENTESIS ASP PLEURAL SPACE W/IMG GUIDE  06/12/2023    Family History  Problem Relation Age of Onset   Throat cancer Father     Social History   Socioeconomic History   Marital status: Divorced    Spouse name: Not on file   Number of  children: 1   Years of education: Not on file   Highest education level: Not on file  Occupational History   Occupation: retired  Tobacco Use   Smoking status: Never   Smokeless tobacco: Never  Vaping Use   Vaping status: Never Used  Substance and Sexual Activity   Alcohol use: Not Currently   Drug use: Never   Sexual activity: Not Currently  Other Topics Concern   Not on file  Social History Narrative   ** Merged History Encounter **       Tobacco use, amount per day now: N/A Past tobacco use, amount per day: N/A How many years did you use tobacco: N/A Alcohol use (drinks per week): N/A Diet: Do you drink/eat things with caffeine: Coffee Marital status: Divorced                                    What year were you married? 1992 Do you live in a house, apartment, assisted living, condo, trailer, etc.? Is it one or more stories? How many persons live in your home? 2 Do you have pets in your home?( please list) No Highest Level of e   ducation completed? High School Current or past profession: Restaurant Employee Do you exercise?   Yes  Type and how often? Walk Do you have a living will? No Do you have a DNR form?    No                               If    not, do you want to discuss one? Do you have signed POA/HPOA forms? No                       If so, please bring to you appointment  Do you have any difficulty bathing or dressing yourself? No Do you have any difficulty preparing food or eating? No    Do you have any difficulty managing your medications? No Do you have any difficulty managing your finances? No Do you have any difficulty affording your medications? No   Social Drivers of Corporate investment banker Strain: Low Risk  (06/11/2022)   Overall Financial Resource Strain (CARDIA)    Difficulty of Paying Living Expenses: Not very hard  Food Insecurity: No Food Insecurity (12/01/2023)   Hunger Vital Sign    Worried About Running  Out of Food in the Last Year: Never true    Ran Out of Food in the Last Year: Never true  Transportation Needs: No Transportation Needs (12/01/2023)   PRAPARE - Administrator, Civil Service (Medical): No    Lack of Transportation (Non-Medical): No  Physical Activity: Not on file  Stress: Not on file  Social Connections: Not on file  Intimate Partner Violence: Not At Risk (12/01/2023)   Humiliation, Afraid, Rape, and Kick questionnaire    Fear of Current or Ex-Partner: No    Emotionally Abused: No    Physically Abused: No    Sexually Abused: No     Physical Exam   Vitals:   12/13/23 0600 12/13/23 0615  BP: 99/61 98/62  Pulse: (!) 57 (!) 58  Resp: (!) 25 19  Temp:    SpO2: 100% 100%    CONSTITUTIONAL: Well-appearing, NAD NEURO/PSYCH:  Alert and oriented x 3, normal and symmetric strength and sensation, normal coordination, normal speech EYES:  eyes equal and reactive ENT/NECK:  no LAD, no JVD CARDIO: Regular rate, well-perfused, normal S1 and S2 PULM:  CTAB no wheezing or rhonchi GI/GU:  non-distended, non-tender MSK/SPINE:  No gross deformities, no edema SKIN:  no rash, atraumatic   *Additional and/or pertinent findings included in MDM below  Diagnostic and Interventional Summary    EKG Interpretation Date/Time:  Saturday December 13 2023 00:49:05 EST Ventricular Rate:  60 PR Interval:  175 QRS Duration:  108 QT Interval:  481 QTC Calculation: 481 R Axis:   -29  Text Interpretation: Sinus rhythm Probable left atrial enlargement Borderline left axis deviation Anterior infarct, old Confirmed by Kennis Carina [phone]) on 12/13/2023 6:40:54 AM       Labs Reviewed  RESP PANEL BY RT-PCR (RSV, FLU A&B, COVID)  RVPGX2 - Abnormal; Notable for the following components:      Result Value   Influenza A by PCR POSITIVE (*)    All other components within normal limits  CBC WITH DIFFERENTIAL/PLATELET - Abnormal; Notable for the following components:   WBC 2.0 (*)     RBC 3.78 (*)    Hemoglobin 10.7 (*)    HCT 35.5 (*)    RDW 15.8 (*)    Platelets 106 (*)    Neutro Abs 1.3 (*)  Lymphs Abs 0.4 (*)    All other components within normal limits  COMPREHENSIVE METABOLIC PANEL - Abnormal; Notable for the following components:   BUN 26 (*)    Creatinine, Ser 3.24 (*)    Calcium 8.4 (*)    Albumin 2.6 (*)    AST 50 (*)    GFR, Estimated 15 (*)    All other components within normal limits  APTT - Abnormal; Notable for the following components:   aPTT 40 (*)    All other components within normal limits  CULTURE, BLOOD (ROUTINE X 2)  CULTURE, BLOOD (ROUTINE X 2)  PROTIME-INR  LIPASE, BLOOD  URINALYSIS, W/ REFLEX TO CULTURE (INFECTION SUSPECTED)  I-STAT CG4 LACTIC ACID, ED  I-STAT CG4 LACTIC ACID, ED    CT HEAD WO CONTRAST ( )  Final Result    CT CHEST ABDOMEN PELVIS WO CONTRAST  Final Result    DG Chest Portable 1 View  Final Result      Medications  ceFEPIme (MAXIPIME) 2 g in sodium chloride 0.9 % 100 mL IVPB (0 g Intravenous Stopped 12/13/23 0250)  metroNIDAZOLE (FLAGYL) IVPB 500 mg (0 mg Intravenous Stopped 12/13/23 0400)  vancomycin (VANCOCIN) IVPB 1000 mg/200 mL premix (0 mg Intravenous Stopped 12/13/23 0335)  sodium chloride 0.9 % bolus 500 mL (0 mLs Intravenous Stopped 12/13/23 0251)     Procedures  /  Critical Care .Critical Care  Performed by: Sabas Sous, MD Authorized by: Sabas Sous, MD   Critical care provider statement:    Critical care time (minutes):  35   Critical care was necessary to treat or prevent imminent or life-threatening deterioration of the following conditions:  Sepsis   Critical care was time spent personally by me on the following activities:  Development of treatment plan with patient or surrogate, discussions with consultants, evaluation of patient's response to treatment, examination of patient, ordering and review of laboratory studies, ordering and review of radiographic studies, ordering and  performing treatments and interventions, pulse oximetry, re-evaluation of patient's condition and review of old charts   ED Course and Medical Decision Making  Initial Impression and Ddx Reportedly confused at home however on my exam she is very awake and alert, fully oriented.  Moving all extremities equally.  She is hypothermic down to 94 core temperature, also exhibiting some hypotension.  Code sepsis initiated.  Has had some abdominal pain today, obtaining advanced imaging.  Past medical/surgical history that increases complexity of ED encounter: ESRD  Interpretation of Diagnostics I personally reviewed the EKG and my interpretation is as follows: Sinus rhythm  Leukopenia, BUN/creatinine with anticipated elevated levels in the setting of ESRD  Patient Reassessment and Ultimate Disposition/Management     Flu positive, awaiting official interpretation no CT chest and abdomen to further evaluate for possible source of infection.  Blood pressure has improved with small amount of fluids.  Slowly getting warmer on the [employer]  Will request hospitalist stepdown admission.  Patient management required discussion with the following services or consulting groups:  Hospitalist Service  Complexity of Problems Addressed Acute illness or injury that poses threat of life of bodily function  Additional Data Reviewed and Analyzed Further history obtained from: Further history from spouse/family member  Additional Factors Impacting ED Encounter Risk Consideration of hospitalization  Elmer Sow. Pilar Plate, MD [facility] Emergency Medicine [facility] [email or web address]  Final Clinical Impressions(s) / ED Diagnoses     ICD-10-CM   1. Influenza  J11.1  2. Hypothermia, initial encounter  [email or web address].[facility]       ED Discharge Orders     None        Discharge Instructions Discussed with and Provided to Patient:   Discharge Instructions   None      Sabas Sous,  MD 12/13/23 [phone]

## 2023-12-13 NOTE — Sepsis Progress Note (Signed)
 Elink monitoring for the code sepsis protocol.

## 2023-12-14 DIAGNOSIS — N186 End stage renal disease: Secondary | ICD-10-CM | POA: Diagnosis not present

## 2023-12-14 DIAGNOSIS — A419 Sepsis, unspecified organism: Secondary | ICD-10-CM | POA: Diagnosis not present

## 2023-12-14 DIAGNOSIS — J101 Influenza due to other identified influenza virus with other respiratory manifestations: Secondary | ICD-10-CM | POA: Diagnosis not present

## 2023-12-14 DIAGNOSIS — I739 Peripheral vascular disease, unspecified: Secondary | ICD-10-CM | POA: Diagnosis not present

## 2023-12-14 LAB — CBC
HCT: 35.7 % — ABNORMAL LOW (ref 36.0–46.0)
Hemoglobin: 11 g/dL — ABNORMAL LOW (ref 12.0–15.0)
MCH: 28.1 pg (ref 26.0–34.0)
MCHC: 30.8 g/dL (ref 30.0–36.0)
MCV: 91.3 fL (ref 80.0–100.0)
Platelets: 99 10*3/uL — ABNORMAL LOW (ref 150–400)
RBC: 3.91 MIL/uL (ref 3.87–5.11)
RDW: 15.5 % (ref 11.5–15.5)
WBC: 2 10*3/uL — ABNORMAL LOW (ref 4.0–10.5)
nRBC: 0 % (ref 0.0–0.2)

## 2023-12-14 LAB — RENAL FUNCTION PANEL
Albumin: 2.4 g/dL — ABNORMAL LOW (ref 3.5–5.0)
Anion gap: 15 (ref 5–15)
BUN: 12 mg/dL (ref 8–23)
CO2: 27 mmol/L (ref 22–32)
Calcium: 8.4 mg/dL — ABNORMAL LOW (ref 8.9–10.3)
Chloride: 95 mmol/L — ABNORMAL LOW (ref 98–111)
Creatinine, Ser: 2.28 mg/dL — ABNORMAL HIGH (ref 0.44–1.00)
GFR, Estimated: 22 mL/min — ABNORMAL LOW (ref >60)
Glucose, Bld: 69 mg/dL — ABNORMAL LOW (ref 70–99)
Phosphorus: 2.6 mg/dL (ref 2.5–4.6)
Potassium: 3.3 mmol/L — ABNORMAL LOW (ref 3.5–5.1)
Sodium: 137 mmol/L (ref 135–145)

## 2023-12-14 MED ORDER — POTASSIUM CHLORIDE CRYS ER 20 MEQ PO TBCR
40.0000 meq | EXTENDED_RELEASE_TABLET | Freq: Once | ORAL | Status: AC
  Administered 2023-12-14: 40 meq via ORAL
  Filled 2023-12-14: qty 2

## 2023-12-14 NOTE — Progress Notes (Signed)
 PROGRESS NOTE        PATIENT DETAILS Name: Kara Mullins Age: 73 y.o. Sex: female Date of Birth: 1951/05/18 Admit Date: 12/13/2023 Admitting Physician Clydie Braun, MD WUJ:[id number], [name], NP  Brief Summary: Patient is a 73 y.o.  female with history of ESRD on HD TTS, HTN, HLD, chronic combined systolic/diastolic heart failure who presented with altered mental status/lethargy-she was recently diagnosed with influenza A on 2/25.  Significant events: 3/1>> admit to [facility]  Significant studies: 3/01>> CT head: No acute findings 3/01>> CT chest/abdomen/pelvis: Small bilateral pleural effusions, consolidative changes in both lower lobes-atelectasis versus superimposed multifocal pneumonia 3/01>> x-ray left foot: No osteomyelitis or foreign body.  Significant microbiology data: 2/25>> influenza A:positive 3/01>> influenza A: Positive 3/01>> influenza B/RSV/COVID PCR: Negative 3/01>> blood culture: Negative  Procedures: None  Consults: Nephrology  Subjective: Lying comfortably in bed-denies any chest pain or shortness of breath.  Completely awake alert-feels much better than yesterday.  Objective: Vitals: Blood pressure 101/76, pulse (!) 54, temperature 98 F (36.7 C), temperature source Oral, resp. rate 20, weight 47.1 kg, SpO2 100%.   Exam: Gen Exam:Alert awake-not in any distress HEENT:atraumatic, normocephalic Chest: B/L clear to auscultation anteriorly CVS:S1S2 regular Abdomen:soft non tender, non distended Extremities:no edema Neurology: Non focal Skin: no rash  Pertinent Labs/Radiology:    Latest Ref Rng & Units 12/14/2023    5:40 AM 12/13/2023   12:44 AM 06/13/2023    2:31 AM  CBC  WBC 4.0 - 10.5 K/uL 2.0  2.0  2.7   Hemoglobin 12.0 - 15.0 g/dL 82.9  56.2  13.0   Hematocrit 36.0 - 46.0 % 35.7  35.5  33.7   Platelets 150 - 400 K/uL 99  106  105     Lab Results  Component Value Date   NA 137 12/14/2023   K 3.3 (L) 12/14/2023    CL 95 (L) 12/14/2023   CO2 27 12/14/2023      Assessment/Plan: Sepsis secondary to influenza/bacterial PNA (POA) Sepsis physiology improved Cultures negative so far Stopping vancomycin Continue Rocephin for a few more days Out of window for Tamiflu.  Acute hypoxic respiratory failure Secondary to above Improved Titrate off oxygen as tolerated  Acute metabolic encephalopathy Secondary to sepsis/hypoxia Improved with supportive care Neuroimaging negative  Left heel ulcer/PAD Chronic issue No evidence of osteomyelitis on x-ray Continue outpatient follow-up with podiatry  HTN BP soft Continue to hold Coreg/Imdur  Chronic diastolic/systolic heart failure Volume status stable Volume removal with HD.  ESRD on HD TTS Nephrology following and directing care  Normocytic anemia Secondary to ESRD At baseline Follow  Chronic thrombocytopenia Mild Stable for follow-up.  Debility/deconditioning PT/OT/SLP eval  Subpleural nodule 2.5 x 1.7 cm Incidental finding Repeat imaging in 1 month as recommended by radiology  Left adnexa complex cyst 6.3 x 4.6 cm Outpatient nonemergent pelvic sonogram-per radiology-likely benign finding given relative stability from prior studies.  Underweight: Estimated body mass index is 17.28 kg/m as calculated from the following:   Height as of 12/09/23: 5\' 5"  (1.651 m).   Weight as of this encounter: 47.1 kg.   Code status:   Code Status: Full Code   DVT Prophylaxis: heparin injection 5,000 Units Start: 12/13/23 0815 SCDs Start: 12/13/23 0748   Family Communication: Daughter-Terra [phone] VM 3/2  Disposition Plan: Status is: Inpatient Remains inpatient appropriate because: Severity of illness  Planned Discharge Destination:Home health   Diet: Diet Order             Diet renal with fluid restriction Fluid restriction: 1200 mL Fluid; Room service appropriate? Yes; Fluid consistency: Thin  Diet effective now                      Antimicrobial agents: Anti-infectives (From admission, onward)    Start     Dose/Rate Route Frequency Ordered Stop   12/13/23 1800  vancomycin (VANCOREADY) IVPB 500 mg/100 mL        500 mg 100 mL/hr over 60 Minutes Intravenous Once per day on Tuesday Thursday Saturday 12/13/23 1335     12/13/23 1245  cefTRIAXone (ROCEPHIN) 2 g in sodium chloride 0.9 % 100 mL IVPB        2 g 200 mL/hr over 30 Minutes Intravenous Every 24 hours 12/13/23 1235     12/13/23 0200  ceFEPIme (MAXIPIME) 2 g in sodium chloride 0.9 % 100 mL IVPB        2 g 200 mL/hr over 30 Minutes Intravenous  Once 12/13/23 0151 12/13/23 0250   12/13/23 0200  metroNIDAZOLE (FLAGYL) IVPB 500 mg        500 mg 100 mL/hr over 60 Minutes Intravenous  Once 12/13/23 0151 12/13/23 0400   12/13/23 0200  vancomycin (VANCOCIN) IVPB 1000 mg/200 mL premix        1,000 mg 200 mL/hr over 60 Minutes Intravenous  Once 12/13/23 0151 12/13/23 0335        MEDICATIONS: Scheduled Meds:  Chlorhexidine Gluconate Cloth  6 each Topical Q0600   feeding supplement (NEPRO CARB STEADY)  237 mL Oral BID BM   fluticasone  2 spray Each Nare Daily   guaiFENesin  600 mg Oral BID   heparin  5,000 Units Subcutaneous Q8H   [START ON 12/16/2023] lidocaine-prilocaine   Topical Q T,Th,Sa-HD   [START ON 12/15/2023] multivitamin  1 tablet Oral Once per day on Monday Wednesday Friday Saturday   sevelamer carbonate  800 mg Oral TID WC   silver sulfADIAZINE  1 Application Topical Daily   sodium chloride flush  3 mL Intravenous Q12H   Continuous Infusions:  cefTRIAXone (ROCEPHIN)  IV 2 g (12/13/23 1329)   vancomycin Stopped (12/13/23 2343)   PRN Meds:.acetaminophen **OR** acetaminophen, albuterol, lidocaine-prilocaine, Muscle Rub   I have personally reviewed following labs and imaging studies  LABORATORY DATA: CBC: Recent Labs  Lab 12/13/23 0044 12/14/23 0540  WBC 2.0* 2.0*  NEUTROABS 1.3*  --   HGB 10.7* 11.0*  HCT 35.5* 35.7*  MCV  93.9 91.3  PLT 106* 99*    Basic Metabolic Panel: Recent Labs  Lab 12/13/23 0044 12/14/23 0540  NA 136 137  K 4.0 3.3*  CL 99 95*  CO2 27 27  GLUCOSE 94 69*  BUN 26* 12  CREATININE 3.24* 2.28*  CALCIUM 8.4* 8.4*  PHOS  --  2.6    GFR: Estimated Creatinine Clearance: 16.8 mL/min (A) (by C-G formula based on SCr of 2.28 mg/dL (H)).  Liver Function Tests: Recent Labs  Lab 12/13/23 0044 12/14/23 0540  AST 50*  --   ALT 27  --   ALKPHOS 74  --   BILITOT 0.7  --   PROT 7.0  --   ALBUMIN 2.6* 2.4*   Recent Labs  Lab 12/13/23 0150  LIPASE 31   Recent Labs  Lab 12/13/23 1242  AMMONIA 32    Coagulation Profile: Recent  Labs  Lab 12/13/23 0150  INR 1.1    Cardiac Enzymes: No results for input(s): "CKTOTAL", "CKMB", "CKMBINDEX", "TROPONINI" in the last 168 hours.  BNP (last 3 results) No results for input(s): "PROBNP" in the last 8760 hours.  Lipid Profile: No results for input(s): "CHOL", "HDL", "LDLCALC", "TRIG", "CHOLHDL", "LDLDIRECT" in the last 72 hours.  Thyroid Function Tests: No results for input(s): "TSH", "T4TOTAL", "FREET4", "T3FREE", "THYROIDAB" in the last 72 hours.  Anemia Panel: No results for input(s): "VITAMINB12", "FOLATE", "FERRITIN", "TIBC", "IRON", "RETICCTPCT" in the last 72 hours.  Urine analysis:    Component Value Date/Time   COLORURINE AMBER (A) 12/13/2023 0735   APPEARANCEUR CLOUDY (A) 12/13/2023 0735   LABSPEC 1.024 12/13/2023 0735   PHURINE 7.0 12/13/2023 0735   GLUCOSEU NEGATIVE 12/13/2023 0735   HGBUR SMALL (A) 12/13/2023 0735   BILIRUBINUR NEGATIVE 12/13/2023 0735   BILIRUBINUR Negative 12/05/2022 1454   KETONESUR NEGATIVE 12/13/2023 0735   PROTEINUR 100 (A) 12/13/2023 0735   UROBILINOGEN 0.2 12/05/2022 1454   UROBILINOGEN 0.2 11/19/2019 2004   NITRITE NEGATIVE 12/13/2023 0735   LEUKOCYTESUR MODERATE (A) 12/13/2023 0735    Sepsis Labs: Lactic Acid, Venous    Component Value Date/Time   LATICACIDVEN 0.7  12/13/2023 0238    MICROBIOLOGY: Recent Results (from the past 240 hours)  Blood Culture (routine x 2)     Status: None (Preliminary result)   Collection Time: 12/13/23  1:50 AM   Specimen: BLOOD RIGHT FOREARM  Result Value Ref Range Status   Specimen Description BLOOD RIGHT FOREARM  Final   Special Requests   Final    BOTTLES DRAWN AEROBIC AND ANAEROBIC Blood Culture results may not be optimal due to an inadequate volume of blood received in culture bottles   Culture   Final    NO GROWTH 1 DAY Performed at [facility], [address], [place], [place] [address]    Report Status PENDING  Incomplete  Blood Culture (routine x 2)     Status: None (Preliminary result)   Collection Time: 12/13/23  2:15 AM   Specimen: BLOOD  Result Value Ref Range Status   Specimen Description BLOOD BLOOD RIGHT WRIST  Final   Special Requests   Final    BOTTLES DRAWN AEROBIC AND ANAEROBIC Blood Culture results may not be optimal due to an inadequate volume of blood received in culture bottles   Culture   Final    NO GROWTH 1 DAY Performed at [facility], [address] [place], [place] [address]    Report Status PENDING  Incomplete  Resp panel by RT-PCR (RSV, Flu A&B, Covid) Anterior Nasal Swab     Status: Abnormal   Collection Time: 12/13/23  2:46 AM   Specimen: Anterior Nasal Swab  Result Value Ref Range Status   SARS Coronavirus 2 by RT PCR NEGATIVE NEGATIVE Final   Influenza A by PCR POSITIVE (A) NEGATIVE Final   Influenza B by PCR NEGATIVE NEGATIVE Final    Comment: (NOTE) The Xpert Xpress SARS-CoV-2/FLU/RSV plus assay is intended as an aid in the diagnosis of influenza from Nasopharyngeal swab specimens and should not be used as a sole basis for treatment. Nasal washings and aspirates are unacceptable for Xpert Xpress SARS-CoV-2/FLU/RSV testing.  Fact Sheet for Patients: BloggerCourse.com  Fact Sheet for Healthcare  Providers: SeriousBroker.it  This test is not yet approved or cleared by the [place] FDA and has been authorized for detection and/or diagnosis of SARS-CoV-2 by FDA under  an Emergency Use Authorization (EUA). This EUA will remain in effect (meaning this test can be used) for the duration of the COVID-19 declaration under Section 564(b)(1) of the Act, 21 U.S.C. section 360bbb-3(b)(1), unless the authorization is terminated or revoked.     Resp Syncytial Virus by PCR NEGATIVE NEGATIVE Final    Comment: (NOTE) Fact Sheet for Patients: BloggerCourse.com  Fact Sheet for Healthcare Providers: SeriousBroker.it  This test is not yet approved or cleared by the [place] FDA and has been authorized for detection and/or diagnosis of SARS-CoV-2 by FDA under an Emergency Use Authorization (EUA). This EUA will remain in effect (meaning this test can be used) for the duration of the COVID-19 declaration under Section 564(b)(1) of the Act, 21 U.S.C. section 360bbb-3(b)(1), unless the authorization is terminated or revoked.  Performed at [facility], [address] [place], [place] [address]     RADIOLOGY STUDIES/RESULTS: DG Foot 2 Views Left Result Date: 12/13/2023 CLINICAL DATA:  Left foot ulcer. EXAM: LEFT FOOT - 2 VIEW COMPARISON:  None Available. FINDINGS: Two views are submitted. The foot is rotated on the AP view. The bones are diffusely demineralized. No evidence of acute fracture, dislocation or bone destruction. Mild midfoot and 1st MTP joint degenerative changes. There is diffuse soft tissue swelling without obvious ulceration, foreign body or soft tissue emphysema. Diffuse vascular calcifications are noted. Additional probable calcifications within the plantar fascia. IMPRESSION: Diffuse soft tissue swelling without radiographic evidence of osteomyelitis or foreign body. Electronically Signed    By: Carey Bullocks M.D.   On: 12/13/2023 11:57   CT CHEST ABDOMEN PELVIS WO CONTRAST Result Date: 12/13/2023 CLINICAL DATA:  Mental status change.  Sepsis. EXAM: CT CHEST, ABDOMEN AND PELVIS WITHOUT CONTRAST TECHNIQUE: Multidetector CT imaging of the chest, abdomen and pelvis was performed following the standard protocol without IV contrast. RADIATION DOSE REDUCTION: This exam was performed according to the departmental dose-optimization program which includes automated exposure control, adjustment of the mA and/or kV according to patient size and/or use of iterative reconstruction technique. COMPARISON:  CT chest 03/23/2023.  CT AP 03/08/2021 FINDINGS: CT CHEST FINDINGS Cardiovascular: Mild cardiac enlargement. Aortic atherosclerosis and coronary artery calcifications. No pericardial effusion. Mediastinum/Nodes: Thyroid gland and trachea are unremarkable. Proximal esophagus appears air-filled. No esophageal wall thickening or mass noted. No mediastinal or hilar adenopathy. Lungs/Pleura: Small bilateral pleural effusions are identified. Consolidative changes identified within both lower lobes which in the setting of chronic pleural effusions and septicemia is favored to reflect a combination of chronic rounded atelectasis with possible superimposed multifocal pneumonia. Overlying the lateral aspect of the lingula there is a subpleural nodule measuring 2.5 x 1.7 cm, image 83/4. Atelectasis and/or consolidative changes were noted in this area on the comparison exam from 03/23/2023. No signs of interstitial edema. Musculoskeletal: No chest wall mass or suspicious bone lesions identified. CT ABDOMEN PELVIS FINDINGS Hepatobiliary: No focal liver abnormality is seen. Tiny stones noted layering within the dependent portion of the gallbladder. No signs of pericholecystic inflammation or bile duct dilatation. Pancreas: Unremarkable. No pancreatic ductal dilatation or surrounding inflammatory changes. Spleen: Normal in  size without focal abnormality. Adrenals/Urinary Tract: Normal adrenal glands. Bilateral renal vascular calcifications. No nephrolithiasis, hydronephrosis or mass identified. The urinary bladder appears unremarkable. Stomach/Bowel: Stomach appears normal and nondistended. There is no pathologic dilatation of the large or small bowel loops. Assessment of bowel pathology is limited due to lack of oral and IV contrast material. The appendix is not confidently identified Vascular/Lymphatic: Aortic atherosclerosis.  Extensive calcifications involving the upper abdominal vascularity identified. No aneurysm. No adenopathy. There is diffuse hazy edema throughout the mesentery and peritoneal fat likely reflecting anasarca. Reproductive: Retroflexed uterus is identified. As noted on the CT from 03/08/2021 there is an mildly complex cyst within the left adnexa which measures 6.3 x 4.6 cm and 30 Hounsfield units, image 94/3. Unchanged from previous exam. Other: Diffuse hazy edema within the peritoneal and mesenteric fat. No significant free fluid or fluid collections identified. Musculoskeletal: No acute or significant osseous findings. Diffuse body wall edema identified. IMPRESSION: 1. Small bilateral pleural effusions are identified. Consolidative changes identified within both lower lobes which in the setting of chronic pleural effusions and septicemia may reflect a combination of chronic rounded atelectasis and/or superimposed multifocal pneumonia. 2. Overlying the lateral aspect of the lingula there is a subpleural nodule measuring 2.5 x 1.7 cm. Atelectasis and/or consolidative changes were noted in this area on the comparison exam from 03/23/2023. Follow-up imaging in 1 month following appropriate therapy is advised to assess for temporal change in the appearance of this nodule. If persistent or progressive then further workup to exclude underlying malignancy would be advised. 3. Diffuse hazy edema throughout the mesentery  and peritoneal fat and subcutaneous soft tissues of the body wall likely reflecting anasarca. 4. Cholelithiasis. 5. Stable appearance of mildly complex cyst within the left adnexa which measures 6.3 x 4.6 cm and 30 Hounsfield units. Given relative stability of this abnormality this is favored to represent a benign finding. Advise follow-up imaging with nonemergent pelvic sonogram for more definitive characterization. 6. Aortic Atherosclerosis (ICD10-I70.0). Electronically Signed   By: Signa Kell M.D.   On: 12/13/2023 06:39   CT HEAD WO CONTRAST ( ) Result Date: 12/13/2023 CLINICAL DATA:  Delirium EXAM: CT HEAD WITHOUT CONTRAST TECHNIQUE: Contiguous axial images were obtained from the base of the skull through the vertex without intravenous contrast. RADIATION DOSE REDUCTION: This exam was performed according to the departmental dose-optimization program which includes automated exposure control, adjustment of the mA and/or kV according to patient size and/or use of iterative reconstruction technique. COMPARISON:  12/22/2021 FINDINGS: Brain: No evidence of acute infarction, hemorrhage, hydrocephalus, extra-axial collection or mass lesion/mass effect. Vascular: Atheromatous calcification of the vertebral arteries. Skull: Normal. Negative for fracture or focal lesion. Sinuses/Orbits: No acute finding. IMPRESSION: No acute or reversible finding. Electronically Signed   By: Tiburcio Pea M.D.   On: 12/13/2023 04:56   DG Chest Portable 1 View Result Date: 12/13/2023 CLINICAL DATA:  Hypoxia  Recent flu; chf; hypoxia EXAM: PORTABLE CHEST 1 VIEW COMPARISON:  CT chest 03/23/2023, chest x-ray 06/12/2023 FINDINGS: The heart and mediastinal contours are unchanged. Atherosclerotic plaque. No focal consolidation. No pulmonary edema. Trace to small loculated right pleural effusion. Small loculated left pleural effusion. No pneumothorax. No acute osseous abnormality. IMPRESSION: 1. Trace to small loculated right pleural  effusion. 2. Small loculated left pleural effusion. 3.  Aortic Atherosclerosis (ICD10-I70.0). Electronically Signed   By: Tish Frederickson M.D.   On: 12/13/2023 01:19     LOS: 1 day   Jeoffrey Massed, MD  [facility]    To contact the attending provider between 7A-7P or the covering provider during after hours 7P-7A, please log into the web site [email or web address] and access using universal [place] password for that web site. If you do not have the password, please call the hospital operator.  12/14/2023, 9:29 AM

## 2023-12-14 NOTE — Evaluation (Signed)
 Physical Therapy Evaluation Patient Details Name: Kara Mullins MRN: [id number] DOB: [birth year] Today's Date: 12/14/2023  History of Present Illness  The pt is a 73 yo female presenting 3/1 with AMS and hypoxia (SpO2 in 60s). Work up revealed influenza A, and sepsis secondary to PNA. PMH includes: ESRD on HD, HTN, PVD, and CHF.   Clinical Impression  Pt in bed upon arrival of PT, agreeable to evaluation at this time. Prior to admission the pt was able to complete transfers with use of sara steady and family assist, is working on pivot transfers with family, but is largely sedentary in bed, wheelchair, or recliner. The pt now presents with limitations in functional mobility, strength, power, endurance, balance, and activity tolerance due to above dx, and will continue to benefit from skilled PT to address these deficits. The pt required modA to complete bed mobility, and minA to rise to standing from elevated EOB with use of stedy. She was able to tolerate transfer to the recliner with use of stedy but did report dizziness with transfer (see values below). The pt will continue to benefit from skilled PT acutely to progress functional strength, activity tolerance, and transfers, but will be safe for return home with family assist once medically stable as she is near her functional baseline.   The pt was given HEP for LE and UE ROM and strengthening to complete during admission.   VITALS:  - supine in bed- BP: 121/69 (84); - sitting EOB - BP: 105/66 (78); HR: 57bpm - sitting EOB - BP: 105/68 (79); - sitting after transfer - BP: 99/66 (77); HR: 59bpm      If plan is discharge home, recommend the following: A little help with walking and/or transfers;A little help with bathing/dressing/bathroom;Assistance with cooking/housework;Direct supervision/assist for medications management;Assist for transportation;Direct supervision/assist for financial management;Help with stairs or ramp for entrance   Can  travel by private vehicle        Equipment Recommendations None recommended by PT  Recommendations for Other Services       Functional Status Assessment Patient has had a recent decline in their functional status and demonstrates the ability to make significant improvements in function in a reasonable and predictable amount of time.     Precautions / Restrictions Precautions Precautions: Fall Recall of Precautions/Restrictions: Intact Precaution/Restrictions Comments: on 2L O2 Restrictions Weight Bearing Restrictions Per Provider Order: No      Mobility  Bed Mobility Overal bed mobility: Needs Assistance Bed Mobility: Supine to Sit     Supine to sit: Mod assist, HOB elevated     General bed mobility comments: minA to LE and modA to trunk with cues for movement of each extremity, increased time, and support of trunk to transition to sitting    Transfers Overall transfer level: Needs assistance   Transfers: Sit to/from Stand, Bed to chair/wheelchair/BSC Sit to Stand: Min assist, +2 safety/equipment, From elevated surface           General transfer comment: assist to position feet on stedy and LUE on bar, minA of 1  to rise to standing from elevated bed. poor standing tolerance due to fatigue and dizziness, Transfer via Lift Equipment: Stedy  Ambulation/Gait               General Gait Details: pt unable at baseline     Balance Overall balance assessment: Needs assistance Sitting-balance support: Bilateral upper extremity supported, Feet unsupported Sitting balance-Leahy Scale: Fair Sitting balance - Comments: UE support on bed or stedy,  no overt LOB   Standing balance support: Bilateral upper extremity supported, During functional activity Standing balance-Leahy Scale: Poor Standing balance comment: BUE support on stedy, poor standing tolerance                             Pertinent Vitals/Pain Pain Assessment Pain Assessment: No/denies  pain    Home Living Family/patient expects to be discharged to:: Private residence Living Arrangements: Children Available Help at Discharge: Family;Available 24 hours/day Type of Home: House Home Access: Ramped entrance       Home Layout: One level Home Equipment: Rollator (4 wheels);Wheelchair - manual;BSC/3in1;Other (comment);Cane - single point;Hospital bed (sara steady) Additional Comments: pt has been at daughter's father's house, constantly has assist available. has been working on pivot transfers and using stedy at home with family assist.    Prior Function Prior Level of Function : Needs assist       Physical Assist : Mobility (physical);ADLs (physical) Mobility (physical): Bed mobility;Transfers ADLs (physical): Bathing;Dressing;IADLs Mobility Comments: transfers with pivot or stedy and family assist. mostly in recliner during day, does exercises with daughter. no recent falls ADLs Comments: assist from family, can use stedy at daughters house to get into bathroom, not at daughter's father's house.     Extremity/Trunk Assessment   Upper Extremity Assessment Upper Extremity Assessment: Generalized weakness (chronic limitations in shoulder ROM and strength, grossly 4/5 in UE other than LUE 4-/5 at shoulder and elbow)    Lower Extremity Assessment Lower Extremity Assessment: Generalized weakness (limited ROM at knees due to pain/arthritis. poor functional power in legs to stand or maintain static standing)    Cervical / Trunk Assessment Cervical / Trunk Assessment: Kyphotic;Other exceptions Cervical / Trunk Exceptions: frail  Communication   Communication Communication: No apparent difficulties    Cognition Arousal: Alert Behavior During Therapy: Flat affect   PT - Cognitive impairments: Attention, Initiation, Problem solving                       PT - Cognition Comments: pt answered orientation questions correctly, at times needed increased cues to  maintain attention or alertness but did not fully fall asleep. pt benefits from cues and increased time to complete movements Following commands: Intact       Cueing Cueing Techniques: Verbal cues     General Comments General comments (skin integrity, edema, etc.): SpO2 stable on RA, BP from 121/69 (84) to 99/66 (77) after mobility        Assessment/Plan    PT Assessment Patient needs continued PT services  PT Problem List Decreased strength;Decreased range of motion;Decreased activity tolerance;Decreased balance;Decreased mobility;Decreased coordination       PT Treatment Interventions DME instruction;Gait training;Functional mobility training;Therapeutic activities;Therapeutic exercise;Balance training;Patient/family education    PT Goals (Current goals can be found in the Care Plan section)  Acute Rehab PT Goals Patient Stated Goal: return home, improve ability to pivot PT Goal Formulation: With patient/family Time For Goal Achievement: 12/28/23 Potential to Achieve Goals: Good    Frequency Min 1X/week        AM-PAC PT "6 Clicks" Mobility  Outcome Measure Help needed turning from your back to your side while in a flat bed without using bedrails?: A Lot Help needed moving from lying on your back to sitting on the side of a flat bed without using bedrails?: A Lot Help needed moving to and from a bed to a chair (including a wheelchair)?:  A Lot Help needed standing up from a chair using your arms (e.g., wheelchair or bedside chair)?: A Little Help needed to walk in hospital room?: Total Help needed climbing 3-5 steps with a railing? : Total 6 Click Score: 11    End of Session Equipment Utilized During Treatment: Oxygen Activity Tolerance: Patient tolerated treatment well Patient left: in chair;with call bell/phone within reach;with chair alarm set;with family/visitor present Nurse Communication: Mobility status PT Visit Diagnosis: Muscle weakness (generalized)  (M62.81);Other abnormalities of gait and mobility (R26.89)    Time: 6237-6283 PT Time Calculation (min) (ACUTE ONLY): 40 min   Charges:   PT Evaluation $PT Eval Moderate Complexity: 1 Mod PT Treatments $Therapeutic Exercise: 8-22 mins $Therapeutic Activity: 8-22 mins PT General Charges $$ ACUTE PT VISIT: 1 Visit         Vickki Muff, PT, DPT   Acute Rehabilitation Department Office [phone] Secure Chat Communication Preferred  Ronnie Derby 12/14/2023, 3:50 PM

## 2023-12-14 NOTE — Evaluation (Signed)
 Occupational Therapy Evaluation Patient Details Name: Kara Mullins MRN: [id number] DOB: Sep 01, 1951 Today's Date: 12/14/2023   History of Present Illness   The pt is a 73 yo female presenting 3/1 with AMS and hypoxia (SpO2 in 60s). Work up revealed influenza A, and sepsis secondary to PNA. PMH includes: ESRD on HD, HTN, PVD, and CHF.     Clinical Impressions PTA, pt lived with daughter who assisted with transfers, LB ADL, medication management, transportation, and provided set-up A for grooming. Upon eval, pt passing short blessed test with score of 0. Pt needing modA for STS and max A for pivot to from chair to bed. Pt needing max-total A for LB ADL and set-up for UB ADL. Pt fatigues in sitting without back support quickly. Pt close to functional baseline; has had some negative experiences with [facility]; encouraged continued practicing of transfers and recommending discharge home with no follow up OT at this time as predominant goal is transfers and PT has recommended OP PT in which OT can be consulted if pt needs both OP therapies at once.      If plan is discharge home, recommend the following:   A lot of help with walking and/or transfers;A lot of help with bathing/dressing/bathroom;Assistance with cooking/housework;Help with stairs or ramp for entrance;Assist for transportation     Functional Status Assessment   Patient has had a recent decline in their functional status and demonstrates the ability to make significant improvements in function in a reasonable and predictable amount of time.     Equipment Recommendations   Tub/shower bench     Recommendations for Other Services         Precautions/Restrictions   Precautions Precautions: Fall Recall of Precautions/Restrictions: Intact Precaution/Restrictions Comments: on 2L O2 Restrictions Weight Bearing Restrictions Per Provider Order: No     Mobility Bed Mobility Overal bed mobility: Needs Assistance Bed Mobility:  Supine to Sit     Supine to sit: Mod assist, HOB elevated     General bed mobility comments: minA to LE and modA to trunk with cues for movement of each extremity, increased time, and support of trunk to transition to sitting    Transfers Overall transfer level: Needs assistance   Transfers: Sit to/from Stand, Bed to chair/wheelchair/BSC Sit to Stand: Mod assist Stand pivot transfers: Max assist         General transfer comment: CGA for scooting forward, then mod A for rise and max A for pivot; unable to progress LEs so truly pivoting      Balance Overall balance assessment: Needs assistance Sitting-balance support: Bilateral upper extremity supported, Feet unsupported Sitting balance-Leahy Scale: Fair Sitting balance - Comments: UE support on bed or stedy, no overt LOB   Standing balance support: Bilateral upper extremity supported, During functional activity Standing balance-Leahy Scale: Poor Standing balance comment: BUE support on OT                           ADL either performed or assessed with clinical judgement   ADL                                               Vision Patient Visual Report: No change from baseline Additional Comments: pt denies new changes     Perception Perception: Not tested       Praxis  Pertinent Vitals/Pain Pain Assessment Pain Assessment: No/denies pain     Extremity/Trunk Assessment Upper Extremity Assessment Upper Extremity Assessment: Generalized weakness (chronic limitations in shoulder ROM and strength, grossly 4/5 in UE other than LUE 4-/5 at shoulder and elbow. shoulder flexion is functional for BADL)   Lower Extremity Assessment Lower Extremity Assessment: Defer to PT evaluation   Cervical / Trunk Assessment Cervical / Trunk Assessment: Kyphotic;Other exceptions Cervical / Trunk Exceptions: frail   Communication Communication Communication: No apparent difficulties    Cognition Arousal: Alert Behavior During Therapy: Flat affect Cognition: No apparent impairments             OT - Cognition Comments: passed short blessed test with score of 0. perhaps some decreased insight into current level of function and progression                 Following commands: Intact       Cueing  General Comments   Cueing Techniques: Verbal cues      Exercises Exercises:  (HEP provided for LE, UE, and core)   Shoulder Instructions      Home Living Family/patient expects to be discharged to:: Private residence Living Arrangements: Children Available Help at Discharge: Family;Available 24 hours/day Type of Home: House Home Access: Ramped entrance     Home Layout: One level     Bathroom Shower/Tub: Sponge bathes at baseline;Tub/shower unit   Bathroom Toilet: Handicapped height Bathroom Accessibility: Yes   Home Equipment: Rollator (4 wheels);Wheelchair - manual;BSC/3in1;Other (comment);Cane - single point;Hospital bed (sara steady)   Additional Comments: pt has been at daughter's father's house, constantly has assist available. has been working on pivot transfers and using stedy at home with family assist.      Prior Functioning/Environment Prior Level of Function : Needs assist       Physical Assist : Mobility (physical);ADLs (physical) Mobility (physical): Bed mobility;Transfers ADLs (physical): Bathing;Dressing;IADLs Mobility Comments: transfers with pivot or stedy and family assist. mostly in recliner during day, does exercises with daughter. no recent falls ADLs Comments: assist from family, can use stedy at daughters house to get into bathroom, not at daughter's father's house. daughter assists with LB ADL at home and bathing. Also provides set-up for grooming as well as brings medications to pt    OT Problem List: Decreased strength;Decreased range of motion;Decreased activity tolerance;Impaired balance (sitting and/or  standing);Decreased knowledge of use of DME or AE   OT Treatment/Interventions: Self-care/ADL training;Therapeutic exercise;DME and/or AE instruction;Therapeutic activities;Patient/family education;Balance training      OT Goals(Current goals can be found in the care plan section)   Acute Rehab OT Goals Patient Stated Goal: go home OT Goal Formulation: With patient Time For Goal Achievement: 12/28/23 Potential to Achieve Goals: Good   OT Frequency:  Min 1X/week    Co-evaluation              AM-PAC OT "6 Clicks" Daily Activity     Outcome Measure Help from another person eating meals?: None Help from another person taking care of personal grooming?: A Little Help from another person toileting, which includes using toliet, bedpan, or urinal?: A Lot Help from another person bathing (including washing, rinsing, drying)?: A Lot Help from another person to put on and taking off regular upper body clothing?: A Little Help from another person to put on and taking off regular lower body clothing?: Total 6 Click Score: 15   End of Session Equipment Utilized During Treatment: Gait belt;Oxygen (2L via [employer]) Nurse Communication:  Mobility status  Activity Tolerance: Patient tolerated treatment well Patient left: in bed;with call bell/phone within reach  OT Visit Diagnosis: Unsteadiness on feet (R26.81);Muscle weakness (generalized) (M62.81);Other abnormalities of gait and mobility (R26.89)                Time: 1550-1620 OT Time Calculation (min): 30 min Charges:  OT General Charges $OT Visit: 1 Visit OT Evaluation $OT Eval Moderate Complexity: 1 Mod OT Treatments $Self Care/Home Management : 8-22 mins  Tyler Deis, OTR/L [facility] Acute Rehabilitation Office: [phone]   Kara Mullins 12/14/2023, 4:35 PM

## 2023-12-14 NOTE — Progress Notes (Signed)
 Received patient in bed to unit.  Alert and oriented.x4 Informed consent signed and in chart.   TX duration:3 hours  Patient tolerated well.  Transported back to the room  Alert, without acute distress.  Hand-off given to patient's nurse.   Access used: AVF Access issues: none  Total UF removed: Medication(s) given: 500mg  /139ml vancomycin  Post HD VS: see table below Post HD weight: 47.1kg   12/14/23 0020  Vitals  BP (!) 110/53  MAP (mmHg) 68  BP Location Right Arm  BP Method Automatic  Patient Position (if appropriate) Lying  Pulse Rate (!) 51  Pulse Rate Source Monitor  ECG Heart Rate (!) 51  Resp 17  Oxygen Therapy  SpO2 100 %  O2 Device Nasal Cannula  O2 Flow Rate (L/min) 2 L/min  Patient Activity (if Appropriate) In bed  Pulse Oximetry Type Continuous  During Treatment Monitoring  Blood Flow Rate (mL/min) 0 mL/min  Arterial Pressure (mmHg) -2.63 mmHg  Venous Pressure (mmHg) -26.66 mmHg  TMP (mmHg) 18.18 mmHg  Ultrafiltration Rate (mL/min) 504 mL/min  Dialysate Flow Rate (mL/min) 299 ml/min  Dialysate Potassium Concentration 3  Dialysate Calcium Concentration 2.5  Duration of HD Treatment -hour(s) 3 hour(s)  Cumulative Fluid Removed (mL) per Treatment  1100.1  HD Safety Checks Performed Yes  Intra-Hemodialysis Comments Tolerated well  Post Treatment  Dialyzer Clearance Lightly streaked  Hemodialysis Intake (mL) 0 mL  Liters Processed 63  Fluid Removed (mL) 1100 mL  Tolerated HD Treatment Yes  Post-Hemodialysis Comments goal met  AVG/AVF Arterial Site Held (minutes) 15 minutes  AVG/AVF Venous Site Held (minutes) 15 minutes  Fistula / Graft Left Upper arm Arteriovenous fistula  Placement Date/Time: 03/27/23 1441   Orientation: Left  Access Location: Upper arm  Access Type: Arteriovenous fistula  Site Condition No complications  Fistula / Graft Assessment Present;Thrill;Bruit  Status Deaccessed;Flushed;Patent      [facility]  Dialysis Unit

## 2023-12-14 NOTE — Progress Notes (Signed)
 [facility] Progress Note    Assessment/ Plan:   ES[facility]  -outpatient HD orders: [facility].  TTS.  3 hours 45 minutes.  F180.  EDW 47 kg.  aVF, 16-gauge.  Flow rates: 350/autoflow 1.5.  3K, 3 calcium.  Heparin: 4000 units bolus.  Meds: None for now.  Last dose of Mircera was 75 mcg on 2/11 -HD per TTS sched   Sepsis -Antibiotics per primary   Acute hypoxic respiratory failure -Likely secondary to pneumonia.  Antibiotics per primary as above. Will UF as tolerated   Influenza A -Initially diagnosed in 2/25.  Per primary   Acute metabolic encephalopathy -Per primary   Volume/ hypertension  -UF as tolerated esp given her soft BPs   Anemia of Chronic Kidney Disease Pancytopenia Hemoglobin 10.7.  Not currently receiving any ESA's at the moment as an outpatient.  Monitor for now.  Checking FE panel.  Pancytopenia workup per primary service -Transfuse PRN for Hgb <7   Secondary Hyperparathyroidism/Hyperphosphatemia -Resume home binders.  Monitor phos--wnl  Subjective:   Seen and examined bedside. No complaints. Daughter at bedside. Tolerated HD yesterday, net uf 1.1L   Objective:   BP 101/76 (BP Location: Right Arm)   Pulse (!) 54   Temp 98 F (36.7 C) (Oral)   Resp 20   Wt 47.1 kg   SpO2 100%   BMI 17.28 kg/m   Intake/Output Summary (Last 24 hours) at 12/14/2023 1339 Last data filed at 12/14/2023 0300 Gross per 24 hour  Intake 340 ml  Output 1100 ml  Net -760 ml   Weight change: 0.272 kg  Physical Exam: Gen: elderly female, nad CVS: rrr Resp: decreased breath sounds bibasilar, normal wob  Abd: soft  Ext: no sig edema Dialysis access: lue avf +b/t  Imaging: DG Foot 2 Views Left Result Date: 12/13/2023 CLINICAL DATA:  Left foot ulcer. EXAM: LEFT FOOT - 2 VIEW COMPARISON:  None Available. FINDINGS: Two views are submitted. The foot is rotated on the AP view. The bones are diffusely demineralized. No evidence of acute fracture, dislocation or bone  destruction. Mild midfoot and 1st MTP joint degenerative changes. There is diffuse soft tissue swelling without obvious ulceration, foreign body or soft tissue emphysema. Diffuse vascular calcifications are noted. Additional probable calcifications within the plantar fascia. IMPRESSION: Diffuse soft tissue swelling without radiographic evidence of osteomyelitis or foreign body. Electronically Signed   By: Carey Bullocks M.D.   On: 12/13/2023 11:57   CT CHEST ABDOMEN PELVIS WO CONTRAST Result Date: 12/13/2023 CLINICAL DATA:  Mental status change.  Sepsis. EXAM: CT CHEST, ABDOMEN AND PELVIS WITHOUT CONTRAST TECHNIQUE: Multidetector CT imaging of the chest, abdomen and pelvis was performed following the standard protocol without IV contrast. RADIATION DOSE REDUCTION: This exam was performed according to the departmental dose-optimization program which includes automated exposure control, adjustment of the mA and/or kV according to patient size and/or use of iterative reconstruction technique. COMPARISON:  CT chest 03/23/2023.  CT AP 03/08/2021 FINDINGS: CT CHEST FINDINGS Cardiovascular: Mild cardiac enlargement. Aortic atherosclerosis and coronary artery calcifications. No pericardial effusion. Mediastinum/Nodes: Thyroid gland and trachea are unremarkable. Proximal esophagus appears air-filled. No esophageal wall thickening or mass noted. No mediastinal or hilar adenopathy. Lungs/Pleura: Small bilateral pleural effusions are identified. Consolidative changes identified within both lower lobes which in the setting of chronic pleural effusions and septicemia is favored to reflect a combination of chronic rounded atelectasis with possible superimposed multifocal pneumonia. Overlying the lateral aspect of the lingula there is a subpleural nodule measuring  2.5 x 1.7 cm, image 83/4. Atelectasis and/or consolidative changes were noted in this area on the comparison exam from 03/23/2023. No signs of interstitial edema.  Musculoskeletal: No chest wall mass or suspicious bone lesions identified. CT ABDOMEN PELVIS FINDINGS Hepatobiliary: No focal liver abnormality is seen. Tiny stones noted layering within the dependent portion of the gallbladder. No signs of pericholecystic inflammation or bile duct dilatation. Pancreas: Unremarkable. No pancreatic ductal dilatation or surrounding inflammatory changes. Spleen: Normal in size without focal abnormality. Adrenals/Urinary Tract: Normal adrenal glands. Bilateral renal vascular calcifications. No nephrolithiasis, hydronephrosis or mass identified. The urinary bladder appears unremarkable. Stomach/Bowel: Stomach appears normal and nondistended. There is no pathologic dilatation of the large or small bowel loops. Assessment of bowel pathology is limited due to lack of oral and IV contrast material. The appendix is not confidently identified Vascular/Lymphatic: Aortic atherosclerosis. Extensive calcifications involving the upper abdominal vascularity identified. No aneurysm. No adenopathy. There is diffuse hazy edema throughout the mesentery and peritoneal fat likely reflecting anasarca. Reproductive: Retroflexed uterus is identified. As noted on the CT from 03/08/2021 there is an mildly complex cyst within the left adnexa which measures 6.3 x 4.6 cm and 30 Hounsfield units, image 94/3. Unchanged from previous exam. Other: Diffuse hazy edema within the peritoneal and mesenteric fat. No significant free fluid or fluid collections identified. Musculoskeletal: No acute or significant osseous findings. Diffuse body wall edema identified. IMPRESSION: 1. Small bilateral pleural effusions are identified. Consolidative changes identified within both lower lobes which in the setting of chronic pleural effusions and septicemia may reflect a combination of chronic rounded atelectasis and/or superimposed multifocal pneumonia. 2. Overlying the lateral aspect of the lingula there is a subpleural nodule  measuring 2.5 x 1.7 cm. Atelectasis and/or consolidative changes were noted in this area on the comparison exam from 03/23/2023. Follow-up imaging in 1 month following appropriate therapy is advised to assess for temporal change in the appearance of this nodule. If persistent or progressive then further workup to exclude underlying malignancy would be advised. 3. Diffuse hazy edema throughout the mesentery and peritoneal fat and subcutaneous soft tissues of the body wall likely reflecting anasarca. 4. Cholelithiasis. 5. Stable appearance of mildly complex cyst within the left adnexa which measures 6.3 x 4.6 cm and 30 Hounsfield units. Given relative stability of this abnormality this is favored to represent a benign finding. Advise follow-up imaging with nonemergent pelvic sonogram for more definitive characterization. 6. Aortic Atherosclerosis (ICD10-I70.0). Electronically Signed   By: Signa Kell M.D.   On: 12/13/2023 06:39   CT HEAD WO CONTRAST ( ) Result Date: 12/13/2023 CLINICAL DATA:  Delirium EXAM: CT HEAD WITHOUT CONTRAST TECHNIQUE: Contiguous axial images were obtained from the base of the skull through the vertex without intravenous contrast. RADIATION DOSE REDUCTION: This exam was performed according to the departmental dose-optimization program which includes automated exposure control, adjustment of the mA and/or kV according to patient size and/or use of iterative reconstruction technique. COMPARISON:  12/22/2021 FINDINGS: Brain: No evidence of acute infarction, hemorrhage, hydrocephalus, extra-axial collection or mass lesion/mass effect. Vascular: Atheromatous calcification of the vertebral arteries. Skull: Normal. Negative for fracture or focal lesion. Sinuses/Orbits: No acute finding. IMPRESSION: No acute or reversible finding. Electronically Signed   By: Tiburcio Pea M.D.   On: 12/13/2023 04:56   DG Chest Portable 1 View Result Date: 12/13/2023 CLINICAL DATA:  Hypoxia  Recent flu; chf;  hypoxia EXAM: PORTABLE CHEST 1 VIEW COMPARISON:  CT chest 03/23/2023, chest x-ray 06/12/2023 FINDINGS: The heart and mediastinal  contours are unchanged. Atherosclerotic plaque. No focal consolidation. No pulmonary edema. Trace to small loculated right pleural effusion. Small loculated left pleural effusion. No pneumothorax. No acute osseous abnormality. IMPRESSION: 1. Trace to small loculated right pleural effusion. 2. Small loculated left pleural effusion. 3.  Aortic Atherosclerosis (ICD10-I70.0). Electronically Signed   By: Tish Frederickson M.D.   On: 12/13/2023 01:19    Labs: BMET Recent Labs  Lab 12/13/23 0044 12/14/23 0540  NA 136 137  K 4.0 3.3*  CL 99 95*  CO2 27 27  GLUCOSE 94 69*  BUN 26* 12  CREATININE 3.24* 2.28*  CALCIUM 8.4* 8.4*  PHOS  --  2.6   CBC Recent Labs  Lab 12/13/23 0044 12/14/23 0540  WBC 2.0* 2.0*  NEUTROABS 1.3*  --   HGB 10.7* 11.0*  HCT 35.5* 35.7*  MCV 93.9 91.3  PLT 106* 99*    Medications:     Chlorhexidine Gluconate Cloth  6 each Topical Q0600   feeding supplement (NEPRO CARB STEADY)  237 mL Oral BID BM   fluticasone  2 spray Each Nare Daily   guaiFENesin  600 mg Oral BID   heparin  5,000 Units Subcutaneous Q8H   [START ON 12/16/2023] lidocaine-prilocaine   Topical Q T,Th,Sa-HD   [START ON 12/15/2023] multivitamin  1 tablet Oral Once per day on Monday Wednesday Friday Saturday   sevelamer carbonate  800 mg Oral TID WC   silver sulfADIAZINE  1 Application Topical Daily   sodium chloride flush  3 mL Intravenous Q12H      Anthony Sar, MD [facility] 12/14/2023, 1:39 PM

## 2023-12-14 NOTE — Evaluation (Signed)
 Clinical/Bedside Swallow Evaluation Patient Details  Name: Kara Mullins MRN: [id number] Date of Birth: Dec 07, 1950  Today's Date: 12/14/2023 Time: SLP Start Time (ACUTE ONLY): 1541 SLP Stop Time (ACUTE ONLY): 1551 SLP Time Calculation (min) (ACUTE ONLY): 10 min  Past Medical History:  Past Medical History:  Diagnosis Date   Chronic combined systolic and diastolic congestive heart failure (HCC) 11/09/2021   Generalized anxiety disorder    Hyperlipidemia 11/09/2021   Hypertension    Stage 3b chronic kidney disease (CKD) (HCC) 11/09/2021   Past Surgical History:  Past Surgical History:  Procedure Laterality Date   AV FISTULA PLACEMENT Left 03/27/2023   Procedure: LEFT ARM BRACHIOCEPHALIC ARTERIOVENOUS (AV) FISTULA CREATION;  Surgeon: [name], MD;  Location: [facility] OR;  Service: Vascular;  Laterality: Left;   CATARACT EXTRACTION, BILATERAL     combined systolic and diastolic congestive heart failure      INSERTION OF DIALYSIS CATHETER N/A 03/27/2023   Procedure: INSERTION OF RIGHT INTERNAL JUGULAR TUNNELED DIALYSIS CATHETER;  Surgeon: [name], MD;  Location: [facility] OR;  Service: Vascular;  Laterality: N/A;   IR THORACENTESIS ASP PLEURAL SPACE W/IMG GUIDE  06/12/2023   HPI:  Kara Mullins is a 73 yo female presenting to ED 3/1 with AMS. Recently diagnosed with Influenza A 2/25. CT Chest shows small bilateral pleural effusions and consolidative changes in both lower lobes, atelectasis vs superimposed multifocal PNA. Previously seen by SLP and ultimately recommended to continue regular diet with thin liquids. PMH includes ESRD on HD, HTN, HLD, chronic combined systolic/diastolic heart failure    Assessment / Plan / Recommendation  Clinical Impression  Pt and her daughter report no overt difficulties with swallowing, although do endorse signs potentially concerning for an esophageal component. No s/s of dysphagia or aspiration were observed this date with trials of thin liquids,  purees, or solids. Discussed potential for completing MBS to further assess swallowing given recurrent PNA to which pt and her daughter were agreeable. Recommend pt continue current diet pending results of MBS, which will be completed as scheduling allows (next date at earliest per radiology). SLP Visit Diagnosis: Dysphagia, unspecified (R13.10)    Aspiration Risk  Mild aspiration risk    Diet Recommendation Thin liquid;Regular    Liquid Administration via: Cup;Straw Medication Administration: Whole meds with liquid Supervision: Patient able to self feed Compensations: Minimize environmental distractions;Slow rate;Small sips/bites Postural Changes: Seated upright at 90 degrees;Remain upright for at least 30 minutes after po intake    Other  Recommendations Oral Care Recommendations: Oral care BID    Recommendations for follow up therapy are one component of a multi-disciplinary discharge planning process, led by the attending physician.  Recommendations may be updated based on patient status, additional functional criteria and insurance authorization.  Follow up Recommendations Other (comment) (TBA pending MBS)      Assistance Recommended at Discharge    Functional Status Assessment Patient has had a recent decline in their functional status and demonstrates the ability to make significant improvements in function in a reasonable and predictable amount of time.  Frequency and Duration min 2x/week  1 week       Prognosis Prognosis for improved oropharyngeal function: Good Barriers to Reach Goals: Time post onset      Swallow Study   General HPI: [name] is a 73 yo female presenting to ED 3/1 with AMS. Recently diagnosed with Influenza A 2/25. CT Chest shows small bilateral pleural effusions and consolidative changes in both lower lobes, atelectasis vs superimposed multifocal  PNA. Previously seen by SLP and ultimately recommended to continue regular diet with thin liquids. PMH  includes ESRD on HD, HTN, HLD, chronic combined systolic/diastolic heart failure Type of Study: Bedside Swallow Evaluation Previous Swallow Assessment: see HPI Diet Prior to this Study: Regular;Thin liquids (Level 0) Temperature Spikes Noted: No Respiratory Status: Nasal cannula History of Recent Intubation: No Behavior/Cognition: Alert;Cooperative;Pleasant mood Oral Cavity Assessment: Within Functional Limits Oral Care Completed by SLP: No Oral Cavity - Dentition: Edentulous Vision: Functional for self-feeding Self-Feeding Abilities: Able to feed self Patient Positioning: Upright in chair Baseline Vocal Quality: Normal Volitional Cough: Strong Volitional Swallow: Able to elicit    Oral/Motor/Sensory Function Overall Oral Motor/Sensory Function: Within functional limits   Ice Chips Ice chips: Not tested   Thin Liquid Thin Liquid: Within functional limits Presentation: Straw;Self Fed    Nectar Thick Nectar Thick Liquid: Not tested   Honey Thick Honey Thick Liquid: Not tested   Puree Puree: Within functional limits Presentation: Spoon   Solid     Solid: Within functional limits      Gwynneth Aliment, M.A., CF-SLP Speech Language Pathology, Acute Rehabilitation Services  Secure Chat preferred [phone]  12/14/2023,4:04 PM

## 2023-12-15 ENCOUNTER — Inpatient Hospital Stay (HOSPITAL_COMMUNITY): Payer: Medicare (Managed Care)

## 2023-12-15 DIAGNOSIS — T68XXXA Hypothermia, initial encounter: Secondary | ICD-10-CM | POA: Diagnosis not present

## 2023-12-15 DIAGNOSIS — J111 Influenza due to unidentified influenza virus with other respiratory manifestations: Secondary | ICD-10-CM

## 2023-12-15 DIAGNOSIS — N186 End stage renal disease: Secondary | ICD-10-CM | POA: Diagnosis not present

## 2023-12-15 DIAGNOSIS — L97429 Non-pressure chronic ulcer of left heel and midfoot with unspecified severity: Secondary | ICD-10-CM | POA: Diagnosis not present

## 2023-12-15 LAB — RENAL FUNCTION PANEL
Albumin: 2.4 g/dL — ABNORMAL LOW (ref 3.5–5.0)
Anion gap: 12 (ref 5–15)
BUN: 20 mg/dL (ref 8–23)
CO2: 24 mmol/L (ref 22–32)
Calcium: 7.6 mg/dL — ABNORMAL LOW (ref 8.9–10.3)
Chloride: 98 mmol/L (ref 98–111)
Creatinine, Ser: 3.15 mg/dL — ABNORMAL HIGH (ref 0.44–1.00)
GFR, Estimated: 15 mL/min — ABNORMAL LOW (ref >60)
Glucose, Bld: 70 mg/dL (ref 70–99)
Phosphorus: 2.9 mg/dL (ref 2.5–4.6)
Potassium: 4.2 mmol/L (ref 3.5–5.1)
Sodium: 134 mmol/L — ABNORMAL LOW (ref 135–145)

## 2023-12-15 MED ORDER — PANTOPRAZOLE SODIUM 40 MG PO TBEC
40.0000 mg | DELAYED_RELEASE_TABLET | Freq: Every day | ORAL | Status: DC
  Administered 2023-12-15: 40 mg via ORAL
  Filled 2023-12-15: qty 1

## 2023-12-15 MED ORDER — LOPERAMIDE HCL 2 MG PO CAPS
2.0000 mg | ORAL_CAPSULE | ORAL | Status: DC | PRN
  Administered 2023-12-15 (×2): 2 mg via ORAL
  Filled 2023-12-15: qty 1

## 2023-12-15 NOTE — Progress Notes (Signed)
  [facility] Progress Note   Subjective: Seen and examined at bedside. Breathing better, now on room air. No complaints.   Objective Vitals:   12/14/23 2000 12/14/23 2334 12/15/23 0400 12/15/23 0911  BP: 120/71 121/82 (!) 122/58   Pulse: (!) 54 65 (!) 50   Resp: 19 18 12    Temp: 97.7 F (36.5 C) 97.9 F (36.6 C) 98.2 F (36.8 C)   TempSrc: Oral Oral Oral   SpO2: 100% 100% 100% 96%  Weight:         Additional Objective Labs: Basic Metabolic Panel: Recent Labs  Lab 12/13/23 0044 12/14/23 0540 12/15/23 0502  NA 136 137 134*  K 4.0 3.3* 4.2  CL 99 95* 98  CO2 27 27 24   GLUCOSE 94 69* 70  BUN 26* 12 20  CREATININE 3.24* 2.28* 3.15*  CALCIUM 8.4* 8.4* 7.6*  PHOS  --  2.6 2.9   CBC: Recent Labs  Lab 12/13/23 0044 12/14/23 0540  WBC 2.0* 2.0*  NEUTROABS 1.3*  --   HGB 10.7* 11.0*  HCT 35.5* 35.7*  MCV 93.9 91.3  PLT 106* 99*   Blood Culture    Component Value Date/Time   SDES BLOOD BLOOD RIGHT WRIST 12/13/2023 0215   SPECREQUEST  12/13/2023 0215    BOTTLES DRAWN AEROBIC AND ANAEROBIC Blood Culture results may not be optimal due to an inadequate volume of blood received in culture bottles   CULT  12/13/2023 0215    NO GROWTH 2 DAYS Performed at [facility], [address], [place], [place] [address]    REPTSTATUS PENDING 12/13/2023 0215     Physical Exam General: Elderly woman, frail, nad Heart: RRR Lungs: Diminished, normal wob Abdomen: soft non-tender Extremities: no LE edema  Dialysis Access: L AVF + bruit   Medications:  cefTRIAXone (ROCEPHIN)  IV 2 g (12/14/23 1149)    Chlorhexidine Gluconate Cloth  6 each Topical Q0600   feeding supplement (NEPRO CARB STEADY)  237 mL Oral BID BM   fluticasone  2 spray Each Nare Daily   guaiFENesin  600 mg Oral BID   heparin  5,000 Units Subcutaneous Q8H   [START ON 12/16/2023] lidocaine-prilocaine   Topical Q T,Th,Sa-HD   multivitamin  1 tablet Oral Once per day on Monday Wednesday  Friday Saturday   pantoprazole  40 mg Oral Q1200   sevelamer carbonate  800 mg Oral TID WC   silver sulfADIAZINE  1 Application Topical Daily   sodium chloride flush  3 mL Intravenous Q12H    Dialysis Orders:  [facility].  3 hours 45 minutes.  F180.  EDW 47 kg.  aVF, 16-gauge.  Flow rates: 350/autoflow 1.5.  3K, 3 calcium.  Heparin: 4000 units bolus.  Meds: None for now.  Last dose of Mircera was 75 mcg on 2/11 -HD per TTS sched  Assessment/Plan: ESRD - HD TTS. Continue on schedule Sepsis - 2/2 pna/influenza.  Influenza A  - per primary  Acute metabolic encephalopathy - resolved  HTN/volume- BP/volume ok  Anemia-  Hgb at goal. No ESA needs  MBD -  Ca/Phos acceptable. Continue home binders  Nutrition - Cont protein supp for low albumin  Hep B SAg+ on 12/13/23 - s/p Hep B vaccination on 2/20. Felt to be transient response 2/2 immunization. Checking Core Ab to rule out active infection.    Tomasa Blase [name] [facility] 12/15/2023,12:57 PM

## 2023-12-15 NOTE — TOC Initial Note (Signed)
 Transition of Care [facility]) - Initial/Assessment Note    Patient Details  Name: Kara Mullins MRN: [id number] Date of Birth: Feb 23, 1951  Transition of Care [facility]) CM/SW Contact:    [name], RN Phone Number: 12/15/2023, 3:51 PM  Clinical Narrative:                 From home with daughter, has PCP  [place] and insurance on file, states has no HH services in place at this time , has all the DME at home that she needs.  States daughter will transport them home at [employer] and daughter is support system, states gets medications from [facility] on [address].  Pta w/chair most of time.  Per PT eval rec OP PT,  daughter states they would like to go to [facility].  NCM sent referral thru epic for OP PT.  Daughters phone is  [phone].   Patient receives HD on Tues, Thurs and Sats.  Expected Discharge Plan: OP Rehab Barriers to Discharge: Continued Medical Work up   Patient Goals and CMS Choice Patient states their goals for this hospitalization and ongoing recovery are:: return home with daughter          Expected Discharge Plan and Services In-house Referral: NA Discharge Planning Services: CM Consult Post Acute Care Choice: NA Living arrangements for the past 2 months: Single Family Home                 DME Arranged: N/A DME Agency: NA       HH Arranged: NA          Prior Living Arrangements/Services Living arrangements for the past 2 months: Single Family Home Lives with:: Adult Children (daughter) Patient language and need for interpreter reviewed:: Yes Do you feel safe going back to the place where you live?: Yes      Need for Family Participation in Patient Care: Yes (Comment) Care giver support system in place?: Yes (comment) Current home services: DME (has all the DME she needs except oxygen.) Criminal Activity/Legal Involvement Pertinent to Current Situation/Hospitalization: No - Comment as needed  Activities of Daily Living   ADL Screening  (condition at time of admission) Independently performs ADLs?: No Does the patient have a NEW difficulty with bathing/dressing/toileting/self-feeding that is expected to last >3 days?: No Does the patient have a NEW difficulty with getting in/out of bed, walking, or climbing stairs that is expected to last >3 days?: No Does the patient have a NEW difficulty with communication that is expected to last >3 days?: No Is the patient deaf or have difficulty hearing?: No Does the patient have difficulty seeing, even when wearing glasses/contacts?: No Does the patient have difficulty concentrating, remembering, or making decisions?: No  Permission Sought/Granted Permission sought to share information with : Case Manager Permission granted to share information with : Yes, Verbal Permission Granted  Share Information with NAME: daughter  Permission granted to share info w AGENCY: OP rehab  Permission granted to share info w Relationship: daughter     Emotional Assessment Appearance:: Appears stated age Attitude/Demeanor/Rapport: Engaged Affect (typically observed): Appropriate Orientation: : Oriented to Self, Oriented to Place, Oriented to  Time, Oriented to Situation Alcohol / Substance Use: Not Applicable Psych Involvement: No (comment)  Admission diagnosis:  Influenza [J11.1] Hypothermia, initial encounter [T68.XXXA] Acute on chronic respiratory failure with hypoxia (HCC) [J96.21] Patient Active Problem List   Diagnosis Date Noted   Acute on chronic respiratory failure with hypoxia (HCC)  12/13/2023   Pleural effusion 12/13/2023   Heel ulcer (HCC) 12/13/2023   Influenza A 12/13/2023   PVD (peripheral vascular disease) (HCC) 12/13/2023   Thrombocytopenia (HCC) 12/13/2023   ESRD on hemodialysis (HCC) 06/11/2023   Chronic idiopathic thrombocytopenia (HCC) 06/11/2023   Acute on chronic diastolic (congestive) heart failure (HCC) 03/22/2023   Anemia of chronic disease 03/22/2023   Sacral  wound 03/22/2023   Acute on chronic diastolic CHF (congestive heart failure) (HCC) 02/26/2023   Cardiorenal syndrome 02/25/2023   Chronic kidney disease (CKD), stage IV (severe) (HCC) 02/25/2023   Hypoglycemia 08/15/2022   Hypertensive emergency 08/15/2022   Obesity (BMI 30-39.9) 08/15/2022   Acute on chronic diastolic heart failure (HCC) 08/14/2022   AKI (acute kidney injury) (HCC)    (HFpEF) heart failure with preserved ejection fraction (HCC) 06/02/2022   Acute respiratory distress 06/02/2022   Bilateral lower extremity edema 06/02/2022   Hypocalcemia 06/02/2022   Subclinical hypothyroidism 06/02/2022   Transaminitis 06/02/2022   Acute kidney injury superimposed on chronic kidney disease (HCC) 03/28/2022   Anasarca associated with disorder of kidney 03/27/2022   Leukopenia 03/27/2022   Aortic atherosclerosis (HCC) 03/27/2022   Pressure injury of skin 01/02/2022   Hypotension 12/30/2021   Pneumonia due to COVID-19 virus 12/26/2021   Palliative care by specialist    Goals of care, counseling/discussion    General weakness    Sepsis (HCC) 12/22/2021   COVID-19 virus infection 12/22/2021   Paroxysmal atrial fibrillation (HCC) 12/22/2021   CKD (chronic kidney disease) stage 5, GFR less than 15 ml/min (HCC) 12/22/2021   Chronic anemia 12/22/2021   Prolonged QT interval 12/22/2021   Debility 11/20/2021   Metabolic acidosis 11/20/2021   Severe sepsis (HCC) 11/19/2021   Hypothermia 11/09/2021   Hyperlipidemia 11/09/2021   Chronic combined systolic and diastolic congestive heart failure (HCC) 11/09/2021   Pancytopenia (HCC) 11/09/2021   Severe protein-calorie malnutrition (HCC) 11/09/2021   Acute combined systolic and diastolic HF (heart failure) (HCC)    Acute exacerbation of CHF (congestive heart failure) (HCC) 03/06/2021   Essential hypertension    Hypertensive urgency 03/05/2021   Acute CHF (congestive heart failure) (HCC) 03/05/2021   Nausea and vomiting 03/05/2021    Elevated troponin level not due myocardial infarction 03/05/2021   Elevated d-dimer 03/05/2021   Acute congestive heart failure (HCC) 03/05/2021   PCP:  Caesar Bookman, NP Pharmacy:   [id number] [place], [place] - [address] AT [facility] & [address] [place] [address] Phone: [phone] Fax: [phone]  [facility] [address] [place] [address] Phone: [phone] Fax: [phone]  [facility] - [place], [place] - [address] [place] [address] Phone: [phone] Fax: [phone]     Social Drivers of Health (SDOH) Social History: SDOH Screenings   Food Insecurity: No Food Insecurity (12/13/2023)  Housing: Low Risk  (12/13/2023)  Transportation Needs: No Transportation Needs (12/13/2023)  Utilities: Not At Risk (12/13/2023)  Alcohol Screen: Low Risk  (06/11/2022)  Depression (PHQ2-9): Low Risk  (01/27/2023)  Financial Resource Strain: Low Risk  (06/11/2022)  Social Connections: Moderately Isolated (12/13/2023)  Tobacco Use: Low Risk  (12/13/2023)   SDOH Interventions:     Readmission Risk Interventions    12/15/2023    3:44 PM 03/24/2023    2:26 PM 02/26/2023    2:53 PM  Readmission Risk Prevention Plan  Transportation  Screening Complete Complete Complete  PCP or Specialist Appt within 3-5 Days Complete Complete   HRI or Home Care Consult Complete Complete Complete  Social Work Consult for Recovery Care Planning/Counseling  -- Complete  Palliative Care Screening Not Applicable Not Applicable Not Applicable  Medication Review (RN Care Manager) Complete Complete Referral to Pharmacy

## 2023-12-15 NOTE — Evaluation (Signed)
 Modified Barium Swallow Study  Patient Details  Name: Kara Mullins MRN: [id number] Date of Birth: Jan 26, 1951  Today's Date: 12/15/2023  Modified Barium Swallow completed.  Full report located under Chart Review in the Imaging Section.  History of Present Illness Kara Mullins is a 73 yo female presenting to ED 3/1 with AMS. Recently diagnosed with Influenza A 2/25. CT Chest shows small bilateral pleural effusions and consolidative changes in both lower lobes, atelectasis vs superimposed multifocal PNA. Previously seen by SLP and ultimately recommended to continue regular diet with thin liquids. PMH includes ESRD on HD, HTN, HLD, chronic combined systolic/diastolic heart failure   Clinical Impression Pt's oropharyngeal swallow is grossly functional, with concern for primary esophageal dysphagia. Orally she has good lingual control and posterior propulsion for clearance, but she does exhibit piecemeal swallowing with thicker consistencies, starting with honey thick liquids. She uses a liquid wash PRN to facilitate this and says that she has been doing this more lately because it feels like it goes down better. Her swallow is triggered on the laryngeal side of the epiglottis and so there is transient penetration with thin liquids, but it mostly clears upon completion of the swallow or subsequent swallows  (PAS 2).  No aspiration is observed. Upon esophageal sweep, pt's esophagus appeared to fairly filled with barium with retrograde flow noted below the PES. If pt does have intermittent aspiration occuring, suspect that it is post-prandial in nature. Education was offered about esophageal precautions and MD notified as well.  DIGEST Swallow Severity Rating*  Safety: 0  Efficiency: 0  Overall Pharyngeal Swallow Severity: 0 1: mild; 2: moderate; 3: severe; 4: profound  *The Dynamic Imaging Grade of Swallowing Toxicity is standardized for the head and neck cancer population, however, demonstrates  promising clinical applications across populations to standardize the clinical rating of pharyngeal swallow safety and severity.  Factors that may increase risk of adverse event in presence of aspiration Rubye Oaks & Clearance Coots 2021): Respiratory or GI disease;Frail or deconditioned  Swallow Evaluation Recommendations Recommendations: PO diet PO Diet Recommendation: Regular;Thin liquids (Level 0) Liquid Administration via: Cup;Straw Medication Administration: Whole meds with liquid Supervision: Patient able to self-feed;Intermittent supervision/cueing for swallowing strategies Swallowing strategies  : Slow rate;Small bites/sips;Follow solids with liquids Postural changes: Position pt fully upright for meals;Stay upright 30-60 min after meals Oral care recommendations: Oral care BID (2x/day) Recommended consults: Consider GI consultation;Consider esophageal assessment      Mahala Menghini., M.A. CCC-SLP Acute Rehabilitation Services Office [phone]  Secure chat preferred  12/15/2023,9:22 AM

## 2023-12-15 NOTE — Progress Notes (Signed)
 PROGRESS NOTE        PATIENT DETAILS Name: Kara Mullins Age: 73 y.o. Sex: female Date of Birth: [birth year] Admit Date: 12/13/2023 Admitting Physician Clydie Braun, MD WUJ:[id number], [name], NP  Brief Summary: Patient is a 73 y.o.  female with history of ESRD on HD TTS, HTN, HLD, chronic combined systolic/diastolic heart failure who presented with altered mental status/lethargy-she was recently diagnosed with influenza A on 2/25.  Significant events: 3/1>> admit to [facility]  Significant studies: 3/01>> CT head: No acute findings 3/01>> CT chest/abdomen/pelvis: Small bilateral pleural effusions, consolidative changes in both lower lobes-atelectasis versus superimposed multifocal pneumonia 3/01>> x-ray left foot: No osteomyelitis or foreign body.  Significant microbiology data: 2/25>> influenza A:positive 3/01>> influenza A: Positive 3/01>> influenza B/RSV/COVID PCR: Negative 3/01>> blood culture: Negative  Procedures: None  Consults: Nephrology  Subjective: Feels better-much more awake and alert-no family at bedside.  Objective: Vitals: Blood pressure (!) 122/58, pulse (!) 50, temperature 98.2 F (36.8 C), temperature source Oral, resp. rate 12, weight 47.1 kg, SpO2 96%.   Exam: Gen Exam:Alert awake-not in any distress-but frail/chronically sick appearing HEENT:atraumatic, normocephalic Chest: B/L clear to auscultation anteriorly CVS:S1S2 regular Abdomen:soft non tender, non distended Extremities:no edema Neurology: Non focal-diffuse generalized weakness. Skin: no rash  Pertinent Labs/Radiology:    Latest Ref Rng & Units 12/14/2023    5:40 AM 12/13/2023   12:44 AM 06/13/2023    2:31 AM  CBC  WBC 4.0 - 10.5 K/uL 2.0  2.0  2.7   Hemoglobin 12.0 - 15.0 g/dL 82.9  56.2  13.0   Hematocrit 36.0 - 46.0 % 35.7  35.5  33.7   Platelets 150 - 400 K/uL 99  106  105     Lab Results  Component Value Date   NA 134 (L) 12/15/2023   K 4.2 12/15/2023    CL 98 12/15/2023   CO2 24 12/15/2023    Assessment/Plan: Sepsis secondary to influenza/bacterial PNA (POA) Sepsis physiology has resolved Cultures negative so far Out of window for Tamiflu On Rocephin-suspect can be transition to oral antibiotics tomorrow  Probable DC home tomorrow.  Acute hypoxic respiratory failure Secondary to above Improved-transition to room air today. Incentive spirometry/flutter valve if she is able to tolerate Mobilize with PT/OT/nursing staff.  Acute metabolic encephalopathy Secondary to sepsis/hypoxia Improved with supportive care Neuroimaging negative  Left heel ulcer/PAD Chronic issue No evidence of osteomyelitis on x-ray Continue outpatient follow-up with podiatry  HTN BP better-and slowly creeping up Continue to hold Coreg/Imdur  Chronic diastolic/systolic heart failure Volume status stable Volume removal with HD.  ESRD on HD TTS Nephrology following and directing care  Normocytic anemia Secondary to ESRD At baseline Follow  Chronic thrombocytopenia Mild Stable for follow-up.  Debility/deconditioning PT/OT/SLP eval-outpatient GI eval/outpatient physical therapy recommended.  Subpleural nodule 2.5 x 1.7 cm Incidental finding Repeat imaging in 1 month as recommended by radiology  Left adnexa complex cyst 6.3 x 4.6 cm Outpatient nonemergent pelvic sonogram-per radiology-likely benign finding given relative stability from prior studies.  Underweight: Estimated body mass index is 17.28 kg/m as calculated from the following:   Height as of 12/09/23: 5\' 5"  (1.651 m).   Weight as of this encounter: 47.1 kg.   Code status:   Code Status: Full Code   DVT Prophylaxis: heparin injection 5,000 Units Start: 12/13/23 0815 SCDs Start: 12/13/23 0748   Family  Communication: Daughter-Terra [phone]- 3/3  Disposition Plan: Status is: Inpatient Remains inpatient appropriate because: Severity of illness   Planned Discharge  Destination:Home health   Diet: Diet Order             Diet renal with fluid restriction Fluid restriction: 1200 mL Fluid; Room service appropriate? Yes; Fluid consistency: Thin  Diet effective now                     Antimicrobial agents: Anti-infectives (From admission, onward)    Start     Dose/Rate Route Frequency Ordered Stop   12/13/23 1800  vancomycin (VANCOREADY) IVPB 500 mg/100 mL  Status:  Discontinued        500 mg 100 mL/hr over 60 Minutes Intravenous Once per day on Tuesday Thursday Saturday 12/13/23 1335 12/14/23 0945   12/13/23 1245  cefTRIAXone (ROCEPHIN) 2 g in sodium chloride 0.9 % 100 mL IVPB        2 g 200 mL/hr over 30 Minutes Intravenous Every 24 hours 12/13/23 1235     12/13/23 0200  ceFEPIme (MAXIPIME) 2 g in sodium chloride 0.9 % 100 mL IVPB        2 g 200 mL/hr over 30 Minutes Intravenous  Once 12/13/23 0151 12/13/23 0250   12/13/23 0200  metroNIDAZOLE (FLAGYL) IVPB 500 mg        500 mg 100 mL/hr over 60 Minutes Intravenous  Once 12/13/23 0151 12/13/23 0400   12/13/23 0200  vancomycin (VANCOCIN) IVPB 1000 mg/200 mL premix        1,000 mg 200 mL/hr over 60 Minutes Intravenous  Once 12/13/23 0151 12/13/23 0335        MEDICATIONS: Scheduled Meds:  Chlorhexidine Gluconate Cloth  6 each Topical Q0600   feeding supplement (NEPRO CARB STEADY)  237 mL Oral BID BM   fluticasone  2 spray Each Nare Daily   guaiFENesin  600 mg Oral BID   heparin  5,000 Units Subcutaneous Q8H   [START ON 12/16/2023] lidocaine-prilocaine   Topical Q T,Th,Sa-HD   multivitamin  1 tablet Oral Once per day on Monday Wednesday Friday Saturday   sevelamer carbonate  800 mg Oral TID WC   silver sulfADIAZINE  1 Application Topical Daily   sodium chloride flush  3 mL Intravenous Q12H   Continuous Infusions:  cefTRIAXone (ROCEPHIN)  IV 2 g (12/14/23 1149)   PRN Meds:.acetaminophen **OR** acetaminophen, albuterol, lidocaine-prilocaine, loperamide, Muscle Rub   I have  personally reviewed following labs and imaging studies  LABORATORY DATA: CBC: Recent Labs  Lab 12/13/23 0044 12/14/23 0540  WBC 2.0* 2.0*  NEUTROABS 1.3*  --   HGB 10.7* 11.0*  HCT 35.5* 35.7*  MCV 93.9 91.3  PLT 106* 99*    Basic Metabolic Panel: Recent Labs  Lab 12/13/23 0044 12/14/23 0540 12/15/23 0502  NA 136 137 134*  K 4.0 3.3* 4.2  CL 99 95* 98  CO2 27 27 24   GLUCOSE 94 69* 70  BUN 26* 12 20  CREATININE 3.24* 2.28* 3.15*  CALCIUM 8.4* 8.4* 7.6*  PHOS  --  2.6 2.9    GFR: Estimated Creatinine Clearance: 12.2 mL/min (A) (by C-G formula based on SCr of 3.15 mg/dL (H)).  Liver Function Tests: Recent Labs  Lab 12/13/23 0044 12/14/23 0540 12/15/23 0502  AST 50*  --   --   ALT 27  --   --   ALKPHOS 74  --   --   BILITOT 0.7  --   --  PROT 7.0  --   --   ALBUMIN 2.6* 2.4* 2.4*   Recent Labs  Lab 12/13/23 0150  LIPASE 31   Recent Labs  Lab 12/13/23 1242  AMMONIA 32    Coagulation Profile: Recent Labs  Lab 12/13/23 0150  INR 1.1    Cardiac Enzymes: No results for input(s): "CKTOTAL", "CKMB", "CKMBINDEX", "TROPONINI" in the last 168 hours.  BNP (last 3 results) No results for input(s): "PROBNP" in the last 8760 hours.  Lipid Profile: No results for input(s): "CHOL", "HDL", "LDLCALC", "TRIG", "CHOLHDL", "LDLDIRECT" in the last 72 hours.  Thyroid Function Tests: No results for input(s): "TSH", "T4TOTAL", "FREET4", "T3FREE", "THYROIDAB" in the last 72 hours.  Anemia Panel: No results for input(s): "VITAMINB12", "FOLATE", "FERRITIN", "TIBC", "IRON", "RETICCTPCT" in the last 72 hours.  Urine analysis:    Component Value Date/Time   COLORURINE AMBER (A) 12/13/2023 0735   APPEARANCEUR CLOUDY (A) 12/13/2023 0735   LABSPEC 1.024 12/13/2023 0735   PHURINE 7.0 12/13/2023 0735   GLUCOSEU NEGATIVE 12/13/2023 0735   HGBUR SMALL (A) 12/13/2023 0735   BILIRUBINUR NEGATIVE 12/13/2023 0735   BILIRUBINUR Negative 12/05/2022 1454   KETONESUR  NEGATIVE 12/13/2023 0735   PROTEINUR 100 (A) 12/13/2023 0735   UROBILINOGEN 0.2 12/05/2022 1454   UROBILINOGEN 0.2 11/19/2019 2004   NITRITE NEGATIVE 12/13/2023 0735   LEUKOCYTESUR MODERATE (A) 12/13/2023 0735    Sepsis Labs: Lactic Acid, Venous    Component Value Date/Time   LATICACIDVEN 0.7 12/13/2023 0238    MICROBIOLOGY: Recent Results (from the past 240 hours)  Blood Culture (routine x 2)     Status: None (Preliminary result)   Collection Time: 12/13/23  1:50 AM   Specimen: BLOOD RIGHT FOREARM  Result Value Ref Range Status   Specimen Description BLOOD RIGHT FOREARM  Final   Special Requests   Final    BOTTLES DRAWN AEROBIC AND ANAEROBIC Blood Culture results may not be optimal due to an inadequate volume of blood received in culture bottles   Culture   Final    NO GROWTH 2 DAYS Performed at [facility], [address] [place], [place] [address]    Report Status PENDING  Incomplete  Blood Culture (routine x 2)     Status: None (Preliminary result)   Collection Time: 12/13/23  2:15 AM   Specimen: BLOOD  Result Value Ref Range Status   Specimen Description BLOOD BLOOD RIGHT WRIST  Final   Special Requests   Final    BOTTLES DRAWN AEROBIC AND ANAEROBIC Blood Culture results may not be optimal due to an inadequate volume of blood received in culture bottles   Culture   Final    NO GROWTH 2 DAYS Performed at [facility], [address] [place], [place] [address]    Report Status PENDING  Incomplete  Resp panel by RT-PCR (RSV, Flu A&B, Covid) Anterior Nasal Swab     Status: Abnormal   Collection Time: 12/13/23  2:46 AM   Specimen: Anterior Nasal Swab  Result Value Ref Range Status   SARS Coronavirus 2 by RT PCR NEGATIVE NEGATIVE Final   Influenza A by PCR POSITIVE (A) NEGATIVE Final   Influenza B by PCR NEGATIVE NEGATIVE Final    Comment: (NOTE) The Xpert Xpress SARS-CoV-2/FLU/RSV plus assay is intended as an aid in the diagnosis of influenza from  Nasopharyngeal swab specimens and should not be used as a sole basis for treatment. Nasal washings and aspirates are unacceptable for Xpert Xpress SARS-CoV-2/FLU/RSV  testing.  Fact Sheet for Patients: BloggerCourse.com  Fact Sheet for Healthcare Providers: SeriousBroker.it  This test is not yet approved or cleared by the [place] FDA and has been authorized for detection and/or diagnosis of SARS-CoV-2 by FDA under an Emergency Use Authorization (EUA). This EUA will remain in effect (meaning this test can be used) for the duration of the COVID-19 declaration under Section 564(b)(1) of the Act, 21 U.S.C. section 360bbb-3(b)(1), unless the authorization is terminated or revoked.     Resp Syncytial Virus by PCR NEGATIVE NEGATIVE Final    Comment: (NOTE) Fact Sheet for Patients: BloggerCourse.com  Fact Sheet for Healthcare Providers: SeriousBroker.it  This test is not yet approved or cleared by the [place] FDA and has been authorized for detection and/or diagnosis of SARS-CoV-2 by FDA under an Emergency Use Authorization (EUA). This EUA will remain in effect (meaning this test can be used) for the duration of the COVID-19 declaration under Section 564(b)(1) of the Act, 21 U.S.C. section 360bbb-3(b)(1), unless the authorization is terminated or revoked.  Performed at [facility], [address] [place], [place] [address]     RADIOLOGY STUDIES/RESULTS: DG Swallowing Func-Speech Pathology Result Date: 12/15/2023 Table formatting from the original result was not included. Modified Barium Swallow Study Patient Details Name: Ryan Palermo MRN: [id number] Date of Birth: 04-07-1951 Today's Date: 12/15/2023 HPI/PMH: HPI: Rukaya Kleinschmidt is a 73 yo female presenting to ED 3/1 with AMS. Recently diagnosed with Influenza A 2/25. CT Chest shows small bilateral pleural effusions  and consolidative changes in both lower lobes, atelectasis vs superimposed multifocal PNA. Previously seen by SLP and ultimately recommended to continue regular diet with thin liquids. PMH includes ESRD on HD, HTN, HLD, chronic combined systolic/diastolic heart failure Clinical Impression: Clinical Impression: Pt's oropharyngeal swallow is grossly functional, with concern for primary esophageal dysphagia. Orally she has good lingual control and posterior propulsion for clearance, but she does exhibit piecemeal swallowing with thicker consistencies, starting with honey thick liquids. She uses a liquid wash PRN to facilitate this and says that she has been doing this more lately because it feels like it goes down better. Her swallow is triggered on the laryngeal side of the epiglottis and so there is transient penetration with thin liquids, but it mostly clears upon completion of the swallow or subsequent swallows  (PAS 2).  No aspiration is observed. Upon esophageal sweep, pt's esophagus appeared to fairly filled with barium with retrograde flow noted below the PES. If pt does have intermittent aspiration occuring, suspect that it is post-prandial in nature. Education was offered about esophageal precautions and MD notified as well. DIGEST Swallow Severity Rating*  Safety: 0  Efficiency: 0  Overall Pharyngeal Swallow Severity: 0 1: mild; 2: moderate; 3: severe; 4: profound *The Dynamic Imaging Grade of Swallowing Toxicity is standardized for the head and neck cancer population, however, demonstrates promising clinical applications across populations to standardize the clinical rating of pharyngeal swallow safety and severity. Factors that may increase risk of adverse event in presence of aspiration Rubye Oaks & Clearance Coots 2021): Factors that may increase risk of adverse event in presence of aspiration Rubye Oaks & Clearance Coots 2021): Respiratory or GI disease; Frail or deconditioned Recommendations/Plan: Swallowing Evaluation  Recommendations Swallowing Evaluation Recommendations Recommendations: PO diet PO Diet Recommendation: Regular; Thin liquids (Level 0) Liquid Administration via: Cup; Straw Medication Administration: Whole meds with liquid Supervision: Patient able to self-feed; Intermittent supervision/cueing for swallowing strategies Swallowing strategies  : Slow rate; Small bites/sips; Follow solids with liquids  Postural changes: Position pt fully upright for meals; Stay upright 30-60 min after meals Oral care recommendations: Oral care BID (2x/day) Recommended consults: Consider GI consultation; Consider esophageal assessment Treatment Plan Treatment Plan Treatment recommendations: Therapy as outlined in treatment plan below Follow-up recommendations: No SLP follow up Functional status assessment: Patient has had a recent decline in their functional status and demonstrates the ability to make significant improvements in function in a reasonable and predictable amount of time. Treatment frequency: Min 2x/week Treatment duration: 1 week Interventions: Aspiration precaution training; Compensatory techniques; Patient/family education; Diet toleration management by SLP Recommendations Recommendations for follow up therapy are one component of a multi-disciplinary discharge planning process, led by the attending physician.  Recommendations may be updated based on patient status, additional functional criteria and insurance authorization. Assessment: Orofacial Exam: Orofacial Exam Oral Cavity - Dentition: Edentulous Anatomy: Anatomy: WFL Boluses Administered: Boluses Administered Boluses Administered: Thin liquids (Level 0); Mildly thick liquids (Level 2, nectar thick); Moderately thick liquids (Level 3, honey thick); Puree; Solid  Oral Impairment Domain: Oral Impairment Domain Lip Closure: No labial escape Tongue control during bolus hold: Cohesive bolus between tongue to palatal seal Bolus preparation/mastication: Slow prolonged  chewing/mashing with complete recollection Bolus transport/lingual motion: Delayed initiation of tongue motion (oral holding) Oral residue: Majority of bolus remaining (piecemeal swallowing) Location of oral residue : Tongue Initiation of pharyngeal swallow : Posterior laryngeal surface of the epiglottis  Pharyngeal Impairment Domain: Pharyngeal Impairment Domain Soft palate elevation: No bolus between soft palate (SP)/pharyngeal wall (PW) Laryngeal elevation: Complete superior movement of thyroid cartilage with complete approximation of arytenoids to epiglottic petiole Anterior hyoid excursion: Complete anterior movement Epiglottic movement: Complete inversion Laryngeal vestibule closure: Complete, no air/contrast in laryngeal vestibule Pharyngeal stripping wave : Present - complete Pharyngeal contraction (A/P view only): N/A Tongue base retraction: No contrast between tongue base and posterior pharyngeal wall (PPW) Pharyngeal residue: Complete pharyngeal clearance Location of pharyngeal residue: N/A  Esophageal Impairment Domain: Esophageal Impairment Domain Esophageal clearance upright position: Esophageal retention with retrograde flow below pharyngoesophageal segment (PES) Pill: Pill Consistency administered: Thin liquids (Level 0) Thin liquids (Level 0): Impaired (see clinical impressions) Penetration/Aspiration Scale Score: Penetration/Aspiration Scale Score 1.  Material does not enter airway: Mildly thick liquids (Level 2, nectar thick); Moderately thick liquids (Level 3, honey thick); Puree; Solid; Pill 2.  Material enters airway, remains ABOVE vocal cords then ejected out: Thin liquids (Level 0) Compensatory Strategies: No data recorded  General Information: Caregiver present: No  Diet Prior to this Study: Regular; Thin liquids (Level 0)   Temperature : Normal   Respiratory Status: WFL   Supplemental O2: None (Room air)   History of Recent Intubation: No  Behavior/Cognition: Alert; Cooperative; Pleasant  mood Self-Feeding Abilities: Able to self-feed Baseline vocal quality/speech: Normal No data recorded Volitional Swallow: Able to elicit Exam Limitations: No limitations Goal Planning: Prognosis for improved oropharyngeal function: Good No data recorded No data recorded Patient/Family Stated Goal: none stated Consulted and agree with results and recommendations: Patient; Physician Pain: Pain Assessment Pain Assessment: Faces Faces Pain Scale: 0 End of Session: Start Time:SLP Start Time (ACUTE ONLY): 0843 Stop Time: SLP Stop Time (ACUTE ONLY): 0856 Time Calculation:SLP Time Calculation (min) (ACUTE ONLY): 13 min Charges: SLP Evaluations $ SLP Speech Visit: 1 Visit SLP Evaluations $BSS Swallow: 1 Procedure $MBS Swallow: 1 Procedure SLP visit diagnosis: SLP Visit Diagnosis: Dysphagia, unspecified (R13.10) Past Medical History: Past Medical History: Diagnosis Date  Chronic combined systolic and diastolic congestive heart failure (HCC) 11/09/2021  Generalized anxiety disorder   Hyperlipidemia 11/09/2021  Hypertension   Stage 3b chronic kidney disease (CKD) (HCC) 11/09/2021 Past Surgical History: Past Surgical History: Procedure Laterality Date  AV FISTULA PLACEMENT Left 03/27/2023  Procedure: LEFT ARM BRACHIOCEPHALIC ARTERIOVENOUS (AV) FISTULA CREATION;  Surgeon: [name], MD;  Location: [facility] OR;  Service: Vascular;  Laterality: Left;  CATARACT EXTRACTION, BILATERAL    combined systolic and diastolic congestive heart failure     INSERTION OF DIALYSIS CATHETER N/A 03/27/2023  Procedure: INSERTION OF RIGHT INTERNAL JUGULAR TUNNELED DIALYSIS CATHETER;  Surgeon: [name], MD;  Location: [facility] OR;  Service: Vascular;  Laterality: N/A;  IR THORACENTESIS ASP PLEURAL SPACE W/IMG GUIDE  06/12/2023 Mahala Menghini., M.A. CCC-SLP Acute Rehabilitation Services Office [phone] Secure chat preferred 12/15/2023, 9:23 AM    LOS: 2 days   Jeoffrey Massed, MD  [facility]    To contact the attending provider between  7A-7P or the covering provider during after hours 7P-7A, please log into the web site [email or web address] and access using universal [place] password for that web site. If you do not have the password, please call the hospital operator.  12/15/2023, 11:35 AM

## 2023-12-16 DIAGNOSIS — A419 Sepsis, unspecified organism: Secondary | ICD-10-CM | POA: Diagnosis not present

## 2023-12-16 DIAGNOSIS — J90 Pleural effusion, not elsewhere classified: Secondary | ICD-10-CM | POA: Diagnosis not present

## 2023-12-16 DIAGNOSIS — J101 Influenza due to other identified influenza virus with other respiratory manifestations: Secondary | ICD-10-CM | POA: Diagnosis not present

## 2023-12-16 DIAGNOSIS — J9621 Acute and chronic respiratory failure with hypoxia: Secondary | ICD-10-CM | POA: Diagnosis not present

## 2023-12-16 LAB — CBC
HCT: 36.8 % (ref 36.0–46.0)
Hemoglobin: 11.3 g/dL — ABNORMAL LOW (ref 12.0–15.0)
MCH: 27.8 pg (ref 26.0–34.0)
MCHC: 30.7 g/dL (ref 30.0–36.0)
MCV: 90.6 fL (ref 80.0–100.0)
Platelets: 93 10*3/uL — ABNORMAL LOW (ref 150–400)
RBC: 4.06 MIL/uL (ref 3.87–5.11)
RDW: 15.4 % (ref 11.5–15.5)
WBC: 2.2 10*3/uL — ABNORMAL LOW (ref 4.0–10.5)
nRBC: 0 % (ref 0.0–0.2)

## 2023-12-16 LAB — RENAL FUNCTION PANEL
Albumin: 2.2 g/dL — ABNORMAL LOW (ref 3.5–5.0)
Anion gap: 9 (ref 5–15)
BUN: 25 mg/dL — ABNORMAL HIGH (ref 8–23)
CO2: 24 mmol/L (ref 22–32)
Calcium: 7.4 mg/dL — ABNORMAL LOW (ref 8.9–10.3)
Chloride: 99 mmol/L (ref 98–111)
Creatinine, Ser: 3.83 mg/dL — ABNORMAL HIGH (ref 0.44–1.00)
GFR, Estimated: 12 mL/min — ABNORMAL LOW (ref >60)
Glucose, Bld: 57 mg/dL — ABNORMAL LOW (ref 70–99)
Phosphorus: 2.9 mg/dL (ref 2.5–4.6)
Potassium: 4.1 mmol/L (ref 3.5–5.1)
Sodium: 132 mmol/L — ABNORMAL LOW (ref 135–145)

## 2023-12-16 LAB — GLUCOSE, CAPILLARY
Glucose-Capillary: 47 mg/dL — ABNORMAL LOW (ref 70–99)
Glucose-Capillary: 162 mg/dL — ABNORMAL HIGH (ref 70–99)
Glucose-Capillary: 156 mg/dL — ABNORMAL HIGH (ref 70–99)

## 2023-12-16 LAB — HEPATITIS B CORE ANTIBODY, TOTAL: HEP B CORE AB: NEGATIVE

## 2023-12-16 LAB — HEPATITIS B SURFACE ANTIBODY, QUANTITATIVE: Hep B S AB Quant (Post): 25.5 m[IU]/mL

## 2023-12-16 MED ORDER — ONDANSETRON HCL 4 MG/2ML IJ SOLN: 4.0000 mg | Freq: Four times a day (QID) | INTRAMUSCULAR | Status: DC | PRN

## 2023-12-16 MED ORDER — DEXTROSE 50 % IV SOLN: 25.0000 g | INTRAVENOUS | Status: DC | PRN

## 2023-12-16 MED ORDER — DEXTROSE 50 % IV SOLN
INTRAVENOUS | Status: AC
  Administered 2023-12-16: 25 g via INTRAVENOUS
  Filled 2023-12-16: qty 50

## 2023-12-16 MED ORDER — DEXTROSE 50 % IV SOLN: 25.0000 g | INTRAVENOUS | Status: AC

## 2023-12-16 MED ORDER — HEPARIN SODIUM (PORCINE) 1000 UNIT/ML IJ SOLN
INTRAMUSCULAR | Status: AC
  Administered 2023-12-16: 3000 [IU]
  Filled 2023-12-16: qty 3

## 2023-12-16 MED ORDER — CEFTRIAXONE SODIUM 2 G IJ SOLR: 2.0000 g | Freq: Once | INTRAMUSCULAR | Status: DC

## 2023-12-16 NOTE — Progress Notes (Signed)
 Brief Note: Plans were for d/c today-after HD-however-she is now vomiting-and is hypogylycemic-hold d/c-antiemetic/D50 prn ordered. Will monitor overnight-holding d/c

## 2023-12-16 NOTE — Progress Notes (Signed)
 OT Cancellation Note  Patient Details Name: Ivyrose Hashman MRN: [id number] DOB: [birth year]   Cancelled Treatment:    Reason Eval/Treat Not Completed: Patient at procedure or test/ unavailable. Pt at HD. Will follow up after as time and schedule allows.   Tyler Deis, OTR/L [facility] Acute Rehabilitation Office: [phone]   Myrla Halsted 12/16/2023, 9:52 AM

## 2023-12-16 NOTE — TOC Transition Note (Signed)
 Transition of Care [facility]) - Discharge Note   Patient Details  Name: Kara Mullins MRN: [id number] Date of Birth: 1951/05/23  Transition of Care [facility]) CM/SW Contact:  [name], RN Phone Number: 12/16/2023, 11:28 AM   Clinical Narrative:    For dc home after HD, she is set up with OP PT with [facility] on AV[facility].    Final next level of care: OP Rehab Barriers to Discharge: Continued Medical Work up   Patient Goals and CMS Choice Patient states their goals for this hospitalization and ongoing recovery are:: return home with daughter          Discharge Placement                       Discharge Plan and Services Additional resources added to the After Visit Summary for   In-house Referral: NA Discharge Planning Services: CM Consult Post Acute Care Choice: NA          DME Arranged: N/A DME Agency: NA       HH Arranged: NA          Social Drivers of Health (SDOH) Interventions SDOH Screenings   Food Insecurity: No Food Insecurity (12/13/2023)  Housing: Low Risk  (12/13/2023)  Transportation Needs: No Transportation Needs (12/13/2023)  Utilities: Not At Risk (12/13/2023)  Alcohol Screen: Low Risk  (06/11/2022)  Depression (PHQ2-9): Low Risk  (01/27/2023)  Financial Resource Strain: Low Risk  (06/11/2022)  Social Connections: Moderately Isolated (12/13/2023)  Tobacco Use: Low Risk  (12/13/2023)     Readmission Risk Interventions    12/15/2023    3:44 PM 03/24/2023    2:26 PM 02/26/2023    2:53 PM  Readmission Risk Prevention Plan  Transportation Screening Complete Complete Complete  PCP or Specialist Appt within 3-5 Days Complete Complete   HRI or Home Care Consult Complete Complete Complete  Social Work Consult for Recovery Care Planning/Counseling  -- Complete  Palliative Care Screening Not Applicable Not Applicable Not Applicable  Medication Review (RN Care Manager) Complete Complete Referral to Pharmacy

## 2023-12-16 NOTE — Discharge Summary (Addendum)
 PATIENT DETAILS Name: Kara Mullins Age: 73 y.o. Sex: female Date of Birth: [birth year] MRN: [id number]. Admitting Physician: Clydie Braun, MD WUJ:[id number], [name], NP  Admit Date: 12/13/2023 Discharge date: 12/16/2023  Recommendations for Outpatient Follow-up:  Follow up with PCP in 1-2 weeks Please obtain CMP/CBC in one week Needs repeat CT chest in 1 month-incidental finding-subpleural nodule Outpatient GYN/pelvic ultrasound incidental finding of left complex adnexa cyst ordered And/elective GI referral-May need EGD-see below.  Admitted From:  Home  Disposition: Home   Discharge Condition: good  CODE STATUS:   Code Status: Full Code   Diet recommendation:  Diet Order             Diet - low sodium heart healthy           Diet renal with fluid restriction Fluid restriction: 1200 mL Fluid; Room service appropriate? Yes; Fluid consistency: Thin  Diet effective now                    Brief Summary: Patient is a 73 y.o.  female with history of ESRD on HD TTS, HTN, HLD, chronic combined systolic/diastolic heart failure who presented with altered mental status/lethargy-she was recently diagnosed with influenza A on 2/25.   Significant events: 3/1>> admit to [facility]   Significant studies: 3/01>> CT head: No acute findings 3/01>> CT chest/abdomen/pelvis: Small bilateral pleural effusions, consolidative changes in both lower lobes-atelectasis versus superimposed multifocal pneumonia 3/01>> x-ray left foot: No osteomyelitis or foreign body.   Significant microbiology data: 2/25>> influenza A:positive 3/01>> influenza A: Positive 3/01>> influenza B/RSV/COVID PCR: Negative 3/01>> blood culture: Negative   Procedures: None   Consults: Nephrology  Brief Hospital Course: Sepsis secondary to influenza/bacterial PNA (POA) Sepsis physiology has resolved Cultures negative so far Out of window for Tamiflu Will complete 5 days of antibiotics today-no further  antibiotics needed on discharge.     Acute hypoxic respiratory failure Secondary to above On room air for the past several days.   Acute metabolic encephalopathy Secondary to sepsis/hypoxia Improved with supportive care Neuroimaging negative   Left heel ulcer/PAD Chronic issue No evidence of osteomyelitis on x-ray Continue outpatient follow-up with podiatry   HTN BP creeping up-resume Coreg/Imdur   Chronic diastolic/systolic heart failure Volume status stable Volume removal with HD.   ESRD on HD TTS Nephrology followed closely.   Normocytic anemia Secondary to ESRD At baseline Follow CBC   Chronic thrombocytopenia Mild Stable for follow-up.   Debility/deconditioning PT/OT/SLP eval-outpatient GI eval/outpatient physical therapy recommended.   Subpleural nodule 2.5 x 1.7 cm Incidental finding Repeat imaging in 1 month as recommended by radiology   Left adnexa complex cyst 6.3 x 4.6 cm Outpatient nonemergent pelvic sonogram-per radiology-likely benign finding given relative stability from prior studies.   Underweight: Estimated body mass index is 17.28 kg/m as calculated from the following:   Height as of 12/09/23: 5\' 5"  (1.651 m).   Weight as of this encounter: 47.1 kg.   RN pressure injury documentation: Assessment and plan as outlined below. Pressure Injury 12/13/23 Heel Left Stage 4 - Full thickness tissue loss with exposed bone, tendon or muscle. (Active)  12/13/23 1224  Location: Heel  Location Orientation: Left  Staging: Stage 4 - Full thickness tissue loss with exposed bone, tendon or muscle.  Wound Description (Comments):   Present on Admission:   Dressing Type -- (Heel protection  ) 12/13/23 1225    Discharge Diagnoses:  Principal Problem:   Sepsis (HCC) Active Problems:   Acute  on chronic respiratory failure with hypoxia (HCC)   Pleural effusion   Influenza A   Hypotension   ESRD on hemodialysis (HCC)   Heel ulcer (HCC)   PVD (peripheral  vascular disease) (HCC)   Anemia of chronic disease   Thrombocytopenia (HCC)   Discharge Instructions:  Activity:  As tolerated with Full fall precautions use walker/cane & assistance as needed  Discharge Instructions     Call MD for:  difficulty breathing, headache or visual disturbances   Complete by: As directed    Call MD for:  persistant dizziness or light-headedness   Complete by: As directed    Diet - low sodium heart healthy   Complete by: As directed    Discharge instructions   Complete by: As directed    Follow with Primary MD  [name], [name], NP in 1-2 weeks  Incidental finding on CT imaging-subpleural nodule please ask your primary care practitioner to repeat CT chest in 1 month to see if this is cleared-if not he will require evaluation by pulmonology.  Incidental finding-left adnexal cyst on CT imaging-please ask your primary care practitioner for outpatient GYN evaluation and a dedicated pelvic ultrasound.ultrasound.  Please get a complete blood count and chemistry panel checked by your Primary MD at your next visit, and again as instructed by your Primary MD.  Get Medicines reviewed and adjusted: Please take all your medications with you for your next visit with your Primary MD  Laboratory/radiological data: Please request your Primary MD to go over all hospital tests and procedure/radiological results at the follow up, please ask your Primary MD to get all Hospital records sent to his/her office.  In some cases, they will be blood work, cultures and biopsy results pending at the time of your discharge. Please request that your primary care M.D. follows up on these results.  Also Note the following: If you experience worsening of your admission symptoms, develop shortness of breath, life threatening emergency, suicidal or homicidal thoughts you must seek medical attention immediately by calling 911 or calling your MD immediately  if symptoms less severe.  You  must read complete instructions/literature along with all the possible adverse reactions/side effects for all the Medicines you take and that have been prescribed to you. Take any new Medicines after you have completely understood and accpet all the possible adverse reactions/side effects.   Do not drive when taking Pain medications or sleeping medications (Benzodaizepines)  Do not take more than prescribed Pain, Sleep and Anxiety Medications. It is not advisable to combine anxiety,sleep and pain medications without talking with your primary care practitioner  Special Instructions: If you have smoked or chewed Tobacco  in the last 2 yrs please stop smoking, stop any regular Alcohol  and or any Recreational drug use.  Wear Seat belts while driving.  Please note: You were cared for by a hospitalist during your hospital stay. Once you are discharged, your primary care physician will handle any further medical issues. Please note that NO REFILLS for any discharge medications will be authorized once you are discharged, as it is imperative that you return to your primary care physician (or establish a relationship with a primary care physician if you do not have one) for your post hospital discharge needs so that they can reassess your need for medications and monitor your lab values.   Increase activity slowly   Complete by: As directed    No wound care   Complete by: As directed  Allergies as of 12/16/2023       Reactions   Crestor [rosuvastatin] Shortness Of Breath   Nitrofuran Derivatives Other (See Comments)   Unknown reaction        Medication List     STOP taking these medications    azithromycin 250 MG tablet Commonly known as: ZITHROMAX       TAKE these medications    carvedilol 3.125 MG tablet Commonly known as: COREG Take 3.125 mg by mouth 2 (two) times daily.   fluticasone 50 MCG/ACT nasal spray Commonly known as: FLONASE Place 2 sprays into both nostrils  daily.   furosemide 80 MG tablet Commonly known as: LASIX Take 1 tablet (80 mg total) by mouth every Monday, Wednesday, Friday and Sunday.   isosorbide mononitrate 30 MG 24 hr tablet Commonly known as: IMDUR Take 1 tablet (30 mg total) by mouth daily. What changed:  when to take this additional instructions   lidocaine-prilocaine cream Commonly known as: EMLA APPLY 1 APPLICATION TOPICALLY AS NEEDED.(USE 30 MINUTES PRIOR TO DIALYSIS).   multivitamin Tabs tablet Take 1 tablet by mouth daily. What changed:  when to take this additional instructions   Muscle Rub 10-15 % Crea Apply 1 Application topically daily as needed for muscle pain.   OVER THE COUNTER MEDICATION Take 1 fluid ounce by mouth daily. Liquid Cell Protein   sevelamer carbonate 800 MG tablet Commonly known as: RENVELA Take 1 tablet (800 mg total) by mouth 3 (three) times daily with meals.   silver sulfADIAZINE 1 % cream Commonly known as: Silvadene Apply 1 Application topically daily.        Follow-up Information     [name], [name], NP Follow up on 12/22/2023.   Specialty: Family Medicine Why: 10:20 for hospital follow up Contact information: [address] [place] [address] [phone]         [facility] Follow up.   Specialty: Rehabilitation Why: they will call you to set up apt times if you do not hear from them in three business days, please give them a call. thank you Contact information: [address] [place] [address] [phone]               Allergies  Allergen Reactions   Crestor [Rosuvastatin] Shortness Of Breath   Nitrofuran Derivatives Other (See Comments)    Unknown reaction     Other Procedures/Studies: DG Swallowing Func-Speech Pathology Result Date: 12/15/2023 Table formatting from the original result was not included. Modified Barium Swallow Study Patient Details Name: Zawadi Aplin MRN: [id number] Date of Birth: 1951-07-30 Today's Date: 12/15/2023 HPI/PMH: HPI: Delonda Coley is a 73 yo female presenting to ED 3/1 with AMS. Recently diagnosed with Influenza A 2/25. CT Chest shows small bilateral pleural effusions and consolidative changes in both lower lobes, atelectasis vs superimposed multifocal PNA. Previously seen by SLP and ultimately recommended to continue regular diet with thin liquids. PMH includes ESRD on HD, HTN, HLD, chronic combined systolic/diastolic heart failure Clinical Impression: Clinical Impression: Pt's oropharyngeal swallow is grossly functional, with concern for primary esophageal dysphagia. Orally she has good lingual control and posterior propulsion for clearance, but she does exhibit piecemeal swallowing with thicker consistencies, starting with honey thick liquids. She uses a liquid wash PRN to facilitate this and says that she has been doing this more lately because it feels like it goes down better. Her swallow is triggered on the laryngeal side of the epiglottis and so  there is transient penetration with thin liquids, but it mostly clears upon completion of the swallow or subsequent swallows  (PAS 2).  No aspiration is observed. Upon esophageal sweep, pt's esophagus appeared to fairly filled with barium with retrograde flow noted below the PES. If pt does have intermittent aspiration occuring, suspect that it is post-prandial in nature. Education was offered about esophageal precautions and MD notified as well. DIGEST Swallow Severity Rating*  Safety: 0  Efficiency: 0  Overall Pharyngeal Swallow Severity: 0 1: mild; 2: moderate; 3: severe; 4: profound *The Dynamic Imaging Grade of Swallowing Toxicity is standardized for the head and neck cancer population, however, demonstrates promising clinical applications across populations to standardize the clinical rating of pharyngeal swallow safety and severity. Factors that may increase risk of adverse event in  presence of aspiration Rubye Oaks & Clearance Coots 2021): Factors that may increase risk of adverse event in presence of aspiration Rubye Oaks & Clearance Coots 2021): Respiratory or GI disease; Frail or deconditioned Recommendations/Plan: Swallowing Evaluation Recommendations Swallowing Evaluation Recommendations Recommendations: PO diet PO Diet Recommendation: Regular; Thin liquids (Level 0) Liquid Administration via: Cup; Straw Medication Administration: Whole meds with liquid Supervision: Patient able to self-feed; Intermittent supervision/cueing for swallowing strategies Swallowing strategies  : Slow rate; Small bites/sips; Follow solids with liquids Postural changes: Position pt fully upright for meals; Stay upright 30-60 min after meals Oral care recommendations: Oral care BID (2x/day) Recommended consults: Consider GI consultation; Consider esophageal assessment Treatment Plan Treatment Plan Treatment recommendations: Therapy as outlined in treatment plan below Follow-up recommendations: No SLP follow up Functional status assessment: Patient has had a recent decline in their functional status and demonstrates the ability to make significant improvements in function in a reasonable and predictable amount of time. Treatment frequency: Min 2x/week Treatment duration: 1 week Interventions: Aspiration precaution training; Compensatory techniques; Patient/family education; Diet toleration management by SLP Recommendations Recommendations for follow up therapy are one component of a multi-disciplinary discharge planning process, led by the attending physician.  Recommendations may be updated based on patient status, additional functional criteria and insurance authorization. Assessment: Orofacial Exam: Orofacial Exam Oral Cavity - Dentition: Edentulous Anatomy: Anatomy: WFL Boluses Administered: Boluses Administered Boluses Administered: Thin liquids (Level 0); Mildly thick liquids (Level 2, nectar thick); Moderately thick liquids  (Level 3, honey thick); Puree; Solid  Oral Impairment Domain: Oral Impairment Domain Lip Closure: No labial escape Tongue control during bolus hold: Cohesive bolus between tongue to palatal seal Bolus preparation/mastication: Slow prolonged chewing/mashing with complete recollection Bolus transport/lingual motion: Delayed initiation of tongue motion (oral holding) Oral residue: Majority of bolus remaining (piecemeal swallowing) Location of oral residue : Tongue Initiation of pharyngeal swallow : Posterior laryngeal surface of the epiglottis  Pharyngeal Impairment Domain: Pharyngeal Impairment Domain Soft palate elevation: No bolus between soft palate (SP)/pharyngeal wall (PW) Laryngeal elevation: Complete superior movement of thyroid cartilage with complete approximation of arytenoids to epiglottic petiole Anterior hyoid excursion: Complete anterior movement Epiglottic movement: Complete inversion Laryngeal vestibule closure: Complete, no air/contrast in laryngeal vestibule Pharyngeal stripping wave : Present - complete Pharyngeal contraction (A/P view only): N/A Tongue base retraction: No contrast between tongue base and posterior pharyngeal wall (PPW) Pharyngeal residue: Complete pharyngeal clearance Location of pharyngeal residue: N/A  Esophageal Impairment Domain: Esophageal Impairment Domain Esophageal clearance upright position: Esophageal retention with retrograde flow below pharyngoesophageal segment (PES) Pill: Pill Consistency administered: Thin liquids (Level 0) Thin liquids (Level 0): Impaired (see clinical impressions) Penetration/Aspiration Scale Score: Penetration/Aspiration Scale Score 1.  Material does not enter airway: Mildly  thick liquids (Level 2, nectar thick); Moderately thick liquids (Level 3, honey thick); Puree; Solid; Pill 2.  Material enters airway, remains ABOVE vocal cords then ejected out: Thin liquids (Level 0) Compensatory Strategies: No data recorded  General Information: Caregiver  present: No  Diet Prior to this Study: Regular; Thin liquids (Level 0)   Temperature : Normal   Respiratory Status: WFL   Supplemental O2: None (Room air)   History of Recent Intubation: No  Behavior/Cognition: Alert; Cooperative; Pleasant mood Self-Feeding Abilities: Able to self-feed Baseline vocal quality/speech: Normal No data recorded Volitional Swallow: Able to elicit Exam Limitations: No limitations Goal Planning: Prognosis for improved oropharyngeal function: Good No data recorded No data recorded Patient/Family Stated Goal: none stated Consulted and agree with results and recommendations: Patient; Physician Pain: Pain Assessment Pain Assessment: Faces Faces Pain Scale: 0 End of Session: Start Time:SLP Start Time (ACUTE ONLY): 0843 Stop Time: SLP Stop Time (ACUTE ONLY): 0856 Time Calculation:SLP Time Calculation (min) (ACUTE ONLY): 13 min Charges: SLP Evaluations $ SLP Speech Visit: 1 Visit SLP Evaluations $BSS Swallow: 1 Procedure $MBS Swallow: 1 Procedure SLP visit diagnosis: SLP Visit Diagnosis: Dysphagia, unspecified (R13.10) Past Medical History: Past Medical History: Diagnosis Date  Chronic combined systolic and diastolic congestive heart failure (HCC) 11/09/2021  Generalized anxiety disorder   Hyperlipidemia 11/09/2021  Hypertension   Stage 3b chronic kidney disease (CKD) (HCC) 11/09/2021 Past Surgical History: Past Surgical History: Procedure Laterality Date  AV FISTULA PLACEMENT Left 03/27/2023  Procedure: LEFT ARM BRACHIOCEPHALIC ARTERIOVENOUS (AV) FISTULA CREATION;  Surgeon: [name], MD;  Location: [facility] OR;  Service: Vascular;  Laterality: Left;  CATARACT EXTRACTION, BILATERAL    combined systolic and diastolic congestive heart failure     INSERTION OF DIALYSIS CATHETER N/A 03/27/2023  Procedure: INSERTION OF RIGHT INTERNAL JUGULAR TUNNELED DIALYSIS CATHETER;  Surgeon: [name], MD;  Location: [facility] OR;  Service: Vascular;  Laterality: N/A;  IR THORACENTESIS ASP PLEURAL SPACE W/IMG GUIDE   06/12/2023 Mahala Menghini., M.A. CCC-SLP Acute Rehabilitation Services Office [phone] Secure chat preferred 12/15/2023, 9:23 AM  DG Foot 2 Views Left Result Date: 12/13/2023 CLINICAL DATA:  Left foot ulcer. EXAM: LEFT FOOT - 2 VIEW COMPARISON:  None Available. FINDINGS: Two views are submitted. The foot is rotated on the AP view. The bones are diffusely demineralized. No evidence of acute fracture, dislocation or bone destruction. Mild midfoot and 1st MTP joint degenerative changes. There is diffuse soft tissue swelling without obvious ulceration, foreign body or soft tissue emphysema. Diffuse vascular calcifications are noted. Additional probable calcifications within the plantar fascia. IMPRESSION: Diffuse soft tissue swelling without radiographic evidence of osteomyelitis or foreign body. Electronically Signed   By: Carey Bullocks M.D.   On: 12/13/2023 11:57   CT CHEST ABDOMEN PELVIS WO CONTRAST Result Date: 12/13/2023 CLINICAL DATA:  Mental status change.  Sepsis. EXAM: CT CHEST, ABDOMEN AND PELVIS WITHOUT CONTRAST TECHNIQUE: Multidetector CT imaging of the chest, abdomen and pelvis was performed following the standard protocol without IV contrast. RADIATION DOSE REDUCTION: This exam was performed according to the departmental dose-optimization program which includes automated exposure control, adjustment of the mA and/or kV according to patient size and/or use of iterative reconstruction technique. COMPARISON:  CT chest 03/23/2023.  CT AP 03/08/2021 FINDINGS: CT CHEST FINDINGS Cardiovascular: Mild cardiac enlargement. Aortic atherosclerosis and coronary artery calcifications. No pericardial effusion. Mediastinum/Nodes: Thyroid gland and trachea are unremarkable. Proximal esophagus appears air-filled. No esophageal wall thickening or mass noted. No mediastinal or hilar adenopathy. Lungs/Pleura: Small bilateral  pleural effusions are identified. Consolidative changes identified within both lower lobes which in  the setting of chronic pleural effusions and septicemia is favored to reflect a combination of chronic rounded atelectasis with possible superimposed multifocal pneumonia. Overlying the lateral aspect of the lingula there is a subpleural nodule measuring 2.5 x 1.7 cm, image 83/4. Atelectasis and/or consolidative changes were noted in this area on the comparison exam from 03/23/2023. No signs of interstitial edema. Musculoskeletal: No chest wall mass or suspicious bone lesions identified. CT ABDOMEN PELVIS FINDINGS Hepatobiliary: No focal liver abnormality is seen. Tiny stones noted layering within the dependent portion of the gallbladder. No signs of pericholecystic inflammation or bile duct dilatation. Pancreas: Unremarkable. No pancreatic ductal dilatation or surrounding inflammatory changes. Spleen: Normal in size without focal abnormality. Adrenals/Urinary Tract: Normal adrenal glands. Bilateral renal vascular calcifications. No nephrolithiasis, hydronephrosis or mass identified. The urinary bladder appears unremarkable. Stomach/Bowel: Stomach appears normal and nondistended. There is no pathologic dilatation of the large or small bowel loops. Assessment of bowel pathology is limited due to lack of oral and IV contrast material. The appendix is not confidently identified Vascular/Lymphatic: Aortic atherosclerosis. Extensive calcifications involving the upper abdominal vascularity identified. No aneurysm. No adenopathy. There is diffuse hazy edema throughout the mesentery and peritoneal fat likely reflecting anasarca. Reproductive: Retroflexed uterus is identified. As noted on the CT from 03/08/2021 there is an mildly complex cyst within the left adnexa which measures 6.3 x 4.6 cm and 30 Hounsfield units, image 94/3. Unchanged from previous exam. Other: Diffuse hazy edema within the peritoneal and mesenteric fat. No significant free fluid or fluid collections identified. Musculoskeletal: No acute or significant  osseous findings. Diffuse body wall edema identified. IMPRESSION: 1. Small bilateral pleural effusions are identified. Consolidative changes identified within both lower lobes which in the setting of chronic pleural effusions and septicemia may reflect a combination of chronic rounded atelectasis and/or superimposed multifocal pneumonia. 2. Overlying the lateral aspect of the lingula there is a subpleural nodule measuring 2.5 x 1.7 cm. Atelectasis and/or consolidative changes were noted in this area on the comparison exam from 03/23/2023. Follow-up imaging in 1 month following appropriate therapy is advised to assess for temporal change in the appearance of this nodule. If persistent or progressive then further workup to exclude underlying malignancy would be advised. 3. Diffuse hazy edema throughout the mesentery and peritoneal fat and subcutaneous soft tissues of the body wall likely reflecting anasarca. 4. Cholelithiasis. 5. Stable appearance of mildly complex cyst within the left adnexa which measures 6.3 x 4.6 cm and 30 Hounsfield units. Given relative stability of this abnormality this is favored to represent a benign finding. Advise follow-up imaging with nonemergent pelvic sonogram for more definitive characterization. 6. Aortic Atherosclerosis (ICD10-I70.0). Electronically Signed   By: Signa Kell M.D.   On: 12/13/2023 06:39   CT HEAD WO CONTRAST ( ) Result Date: 12/13/2023 CLINICAL DATA:  Delirium EXAM: CT HEAD WITHOUT CONTRAST TECHNIQUE: Contiguous axial images were obtained from the base of the skull through the vertex without intravenous contrast. RADIATION DOSE REDUCTION: This exam was performed according to the departmental dose-optimization program which includes automated exposure control, adjustment of the mA and/or kV according to patient size and/or use of iterative reconstruction technique. COMPARISON:  12/22/2021 FINDINGS: Brain: No evidence of acute infarction, hemorrhage,  hydrocephalus, extra-axial collection or mass lesion/mass effect. Vascular: Atheromatous calcification of the vertebral arteries. Skull: Normal. Negative for fracture or focal lesion. Sinuses/Orbits: No acute finding. IMPRESSION: No acute or reversible finding. Electronically Signed  By: Tiburcio Pea M.D.   On: 12/13/2023 04:56   DG Chest Portable 1 View Result Date: 12/13/2023 CLINICAL DATA:  Hypoxia  Recent flu; chf; hypoxia EXAM: PORTABLE CHEST 1 VIEW COMPARISON:  CT chest 03/23/2023, chest x-ray 06/12/2023 FINDINGS: The heart and mediastinal contours are unchanged. Atherosclerotic plaque. No focal consolidation. No pulmonary edema. Trace to small loculated right pleural effusion. Small loculated left pleural effusion. No pneumothorax. No acute osseous abnormality. IMPRESSION: 1. Trace to small loculated right pleural effusion. 2. Small loculated left pleural effusion. 3.  Aortic Atherosclerosis (ICD10-I70.0). Electronically Signed   By: Tish Frederickson M.D.   On: 12/13/2023 01:19   VAS [place] ABI WITH/WO TBI Result Date: 12/08/2023  LOWER EXTREMITY DOPPLER STUDY Patient Name:  Kara Mullins  Date of Exam:   12/08/2023 Medical Rec #: [id number]       Accession #:    [id number] Date of Birth: [birth year]      Patient Gender: F Patient Age:   27 years Exam Location:  [facility] Procedure:      VAS [place] ABI WITH/WO TBI Referring Phys: Ovid Curd --------------------------------------------------------------------------------  Indications: Claudication, and ulceration.  Performing Technologist: Argentina Ponder RVS  Examination Guidelines: A complete evaluation includes at minimum, Doppler waveform signals and systolic blood pressure reading at the level of bilateral brachial, anterior tibial, and posterior tibial arteries, when vessel segments are accessible. Bilateral testing is considered an integral part of a complete examination. Photoelectric Plethysmograph (PPG) waveforms and toe  systolic pressure readings are included as required and additional duplex testing as needed. Limited examinations for reoccurring indications may be performed as noted.  ABI Findings: +---------+------------------+-----+---------+--------+ Right    Rt Pressure (mmHg)IndexWaveform Comment  +---------+------------------+-----+---------+--------+ Brachial 144                                      +---------+------------------+-----+---------+--------+ PTA      255               1.77 biphasic          +---------+------------------+-----+---------+--------+ DP       255               1.77 triphasic         +---------+------------------+-----+---------+--------+ Great Toe142               0.99                   +---------+------------------+-----+---------+--------+ +--------+------------------+-----+--------+----------------+ Left    Lt Pressure (mmHg)IndexWaveformComment          +--------+------------------+-----+--------+----------------+ Brachial140                                             +--------+------------------+-----+--------+----------------+ PTA     251               1.74 biphasicaudibly biphasic +--------+------------------+-----+--------+----------------+ DP      255               1.77 biphasic                 +--------+------------------+-----+--------+----------------+ +-------+-----------+-----------+------------+------------+ ABI/TBIToday's ABIToday's TBIPrevious ABIPrevious TBI +-------+-----------+-----------+------------+------------+ Right  [place]         0.99                                +-------+-----------+-----------+------------+------------+  Left   [place]         [place]                                  +-------+-----------+-----------+------------+------------+  Summary: Right: Resting right ankle-brachial index indicates noncompressible right lower extremity arteries. The right toe-brachial index is normal. Left: Resting left  ankle-brachial index indicates noncompressible left lower extremity arteries. The left toe-brachial index is normal. *See table(s) above for measurements and observations.  Electronically signed by Coral Else MD on 12/08/2023 at 4:10:48 PM.    Final      TODAY-DAY OF DISCHARGE:  Subjective:   [name] today has no headache,no chest abdominal pain,no new weakness tingling or numbness, feels much better wants to go home today.  Objective:   Blood pressure (!) 140/99, pulse (!) 56, temperature 98.7 F (37.1 C), temperature source Oral, resp. rate 15, weight 47.1 kg, SpO2 97%.  Intake/Output Summary (Last 24 hours) at 12/16/2023 0841 Last data filed at 12/16/2023 0612 Gross per 24 hour  Intake 360 ml  Output 0 ml  Net 360 ml   Filed Weights   12/13/23 0009 12/13/23 2052 12/14/23 0020  Weight: 47.6 kg 47.9 kg 47.1 kg    Exam: Awake Alert, Oriented *3, No new F.N deficits, Normal affect [place].AT,PERRAL Supple Neck,No JVD, No cervical lymphadenopathy appriciated.  Symmetrical Chest wall movement, Good air movement bilaterally, CTAB RRR,No Gallops,Rubs or new Murmurs, No Parasternal Heave +ve B.Sounds, Abd Soft, Non tender, No organomegaly appriciated, No rebound -guarding or rigidity. No Cyanosis, Clubbing or edema, No new Rash or bruise   PERTINENT RADIOLOGIC STUDIES: DG Swallowing Func-Speech Pathology Result Date: 12/15/2023 Table formatting from the original result was not included. Modified Barium Swallow Study Patient Details Name: Kara Mullins MRN: [id number] Date of Birth: [birth year] Today's Date: 12/15/2023 HPI/PMH: HPI: Kara Mullins is a 73 yo female presenting to ED 3/1 with AMS. Recently diagnosed with Influenza A 2/25. CT Chest shows small bilateral pleural effusions and consolidative changes in both lower lobes, atelectasis vs superimposed multifocal PNA. Previously seen by SLP and ultimately recommended to continue regular diet with thin liquids. PMH includes ESRD on  HD, HTN, HLD, chronic combined systolic/diastolic heart failure Clinical Impression: Clinical Impression: Pt's oropharyngeal swallow is grossly functional, with concern for primary esophageal dysphagia. Orally she has good lingual control and posterior propulsion for clearance, but she does exhibit piecemeal swallowing with thicker consistencies, starting with honey thick liquids. She uses a liquid wash PRN to facilitate this and says that she has been doing this more lately because it feels like it goes down better. Her swallow is triggered on the laryngeal side of the epiglottis and so there is transient penetration with thin liquids, but it mostly clears upon completion of the swallow or subsequent swallows  (PAS 2).  No aspiration is observed. Upon esophageal sweep, pt's esophagus appeared to fairly filled with barium with retrograde flow noted below the PES. If pt does have intermittent aspiration occuring, suspect that it is post-prandial in nature. Education was offered about esophageal precautions and MD notified as well. DIGEST Swallow Severity Rating*  Safety: 0  Efficiency: 0  Overall Pharyngeal Swallow Severity: 0 1: mild; 2: moderate; 3: severe; 4: profound *The Dynamic Imaging Grade of Swallowing Toxicity is standardized for the head and neck cancer population, however, demonstrates promising clinical applications across populations to standardize the clinical rating of pharyngeal swallow safety and severity. Factors that  may increase risk of adverse event in presence of aspiration Rubye Oaks & Clearance Coots 2021): Factors that may increase risk of adverse event in presence of aspiration Rubye Oaks & Clearance Coots 2021): Respiratory or GI disease; Frail or deconditioned Recommendations/Plan: Swallowing Evaluation Recommendations Swallowing Evaluation Recommendations Recommendations: PO diet PO Diet Recommendation: Regular; Thin liquids (Level 0) Liquid Administration via: Cup; Straw Medication Administration: Whole meds  with liquid Supervision: Patient able to self-feed; Intermittent supervision/cueing for swallowing strategies Swallowing strategies  : Slow rate; Small bites/sips; Follow solids with liquids Postural changes: Position pt fully upright for meals; Stay upright 30-60 min after meals Oral care recommendations: Oral care BID (2x/day) Recommended consults: Consider GI consultation; Consider esophageal assessment Treatment Plan Treatment Plan Treatment recommendations: Therapy as outlined in treatment plan below Follow-up recommendations: No SLP follow up Functional status assessment: Patient has had a recent decline in their functional status and demonstrates the ability to make significant improvements in function in a reasonable and predictable amount of time. Treatment frequency: Min 2x/week Treatment duration: 1 week Interventions: Aspiration precaution training; Compensatory techniques; Patient/family education; Diet toleration management by SLP Recommendations Recommendations for follow up therapy are one component of a multi-disciplinary discharge planning process, led by the attending physician.  Recommendations may be updated based on patient status, additional functional criteria and insurance authorization. Assessment: Orofacial Exam: Orofacial Exam Oral Cavity - Dentition: Edentulous Anatomy: Anatomy: WFL Boluses Administered: Boluses Administered Boluses Administered: Thin liquids (Level 0); Mildly thick liquids (Level 2, nectar thick); Moderately thick liquids (Level 3, honey thick); Puree; Solid  Oral Impairment Domain: Oral Impairment Domain Lip Closure: No labial escape Tongue control during bolus hold: Cohesive bolus between tongue to palatal seal Bolus preparation/mastication: Slow prolonged chewing/mashing with complete recollection Bolus transport/lingual motion: Delayed initiation of tongue motion (oral holding) Oral residue: Majority of bolus remaining (piecemeal swallowing) Location of oral residue  : Tongue Initiation of pharyngeal swallow : Posterior laryngeal surface of the epiglottis  Pharyngeal Impairment Domain: Pharyngeal Impairment Domain Soft palate elevation: No bolus between soft palate (SP)/pharyngeal wall (PW) Laryngeal elevation: Complete superior movement of thyroid cartilage with complete approximation of arytenoids to epiglottic petiole Anterior hyoid excursion: Complete anterior movement Epiglottic movement: Complete inversion Laryngeal vestibule closure: Complete, no air/contrast in laryngeal vestibule Pharyngeal stripping wave : Present - complete Pharyngeal contraction (A/P view only): N/A Tongue base retraction: No contrast between tongue base and posterior pharyngeal wall (PPW) Pharyngeal residue: Complete pharyngeal clearance Location of pharyngeal residue: N/A  Esophageal Impairment Domain: Esophageal Impairment Domain Esophageal clearance upright position: Esophageal retention with retrograde flow below pharyngoesophageal segment (PES) Pill: Pill Consistency administered: Thin liquids (Level 0) Thin liquids (Level 0): Impaired (see clinical impressions) Penetration/Aspiration Scale Score: Penetration/Aspiration Scale Score 1.  Material does not enter airway: Mildly thick liquids (Level 2, nectar thick); Moderately thick liquids (Level 3, honey thick); Puree; Solid; Pill 2.  Material enters airway, remains ABOVE vocal cords then ejected out: Thin liquids (Level 0) Compensatory Strategies: No data recorded  General Information: Caregiver present: No  Diet Prior to this Study: Regular; Thin liquids (Level 0)   Temperature : Normal   Respiratory Status: WFL   Supplemental O2: None (Room air)   History of Recent Intubation: No  Behavior/Cognition: Alert; Cooperative; Pleasant mood Self-Feeding Abilities: Able to self-feed Baseline vocal quality/speech: Normal No data recorded Volitional Swallow: Able to elicit Exam Limitations: No limitations Goal Planning: Prognosis for improved  oropharyngeal function: Good No data recorded No data recorded Patient/Family Stated Goal: none stated Consulted and agree with results  and recommendations: Patient; Physician Pain: Pain Assessment Pain Assessment: Faces Faces Pain Scale: 0 End of Session: Start Time:SLP Start Time (ACUTE ONLY): 0843 Stop Time: SLP Stop Time (ACUTE ONLY): 0856 Time Calculation:SLP Time Calculation (min) (ACUTE ONLY): 13 min Charges: SLP Evaluations $ SLP Speech Visit: 1 Visit SLP Evaluations $BSS Swallow: 1 Procedure $MBS Swallow: 1 Procedure SLP visit diagnosis: SLP Visit Diagnosis: Dysphagia, unspecified (R13.10) Past Medical History: Past Medical History: Diagnosis Date  Chronic combined systolic and diastolic congestive heart failure (HCC) 11/09/2021  Generalized anxiety disorder   Hyperlipidemia 11/09/2021  Hypertension   Stage 3b chronic kidney disease (CKD) (HCC) 11/09/2021 Past Surgical History: Past Surgical History: Procedure Laterality Date  AV FISTULA PLACEMENT Left 03/27/2023  Procedure: LEFT ARM BRACHIOCEPHALIC ARTERIOVENOUS (AV) FISTULA CREATION;  Surgeon: [name], MD;  Location: [facility] OR;  Service: Vascular;  Laterality: Left;  CATARACT EXTRACTION, BILATERAL    combined systolic and diastolic congestive heart failure     INSERTION OF DIALYSIS CATHETER N/A 03/27/2023  Procedure: INSERTION OF RIGHT INTERNAL JUGULAR TUNNELED DIALYSIS CATHETER;  Surgeon: [name], MD;  Location: [facility] OR;  Service: Vascular;  Laterality: N/A;  IR THORACENTESIS ASP PLEURAL SPACE W/IMG GUIDE  06/12/2023 Mahala Menghini., M.A. CCC-SLP Acute Rehabilitation Services Office [phone] Secure chat preferred 12/15/2023, 9:23 AM    PERTINENT LAB RESULTS: CBC: Recent Labs    12/14/23 0540  WBC 2.0*  HGB 11.0*  HCT 35.7*  PLT 99*   CMET CMP     Component Value Date/Time   NA 132 (L) 12/16/2023 0445   NA 142 01/22/2023 0000   K 4.1 12/16/2023 0445   CL 99 12/16/2023 0445   CO2 24 12/16/2023 0445   GLUCOSE 57 (L) 12/16/2023  0445   BUN 25 (H) 12/16/2023 0445   BUN 60 (A) 01/22/2023 0000   CREATININE 3.83 (H) 12/16/2023 0445   CREATININE 4.06 (H) 11/06/2021 1617   CALCIUM 7.4 (L) 12/16/2023 0445   PROT 7.0 12/13/2023 0044   PROT 5.1 (L) 12/19/2021 1020   ALBUMIN 2.2 (L) 12/16/2023 0445   ALBUMIN 2.2 (L) 12/19/2021 1020   AST 50 (H) 12/13/2023 0044   ALT 27 12/13/2023 0044   ALKPHOS 74 12/13/2023 0044   BILITOT 0.7 12/13/2023 0044   BILITOT 0.3 12/19/2021 1020   EGFR 10 10/29/2022 0000   EGFR 10 (L) 12/19/2021 1020   GFRNONAA 12 (L) 12/16/2023 0445    GFR Estimated Creatinine Clearance: 10 mL/min (A) (by C-G formula based on SCr of 3.83 mg/dL (H)). No results for input(s): "LIPASE", "AMYLASE" in the last 72 hours. No results for input(s): "CKTOTAL", "CKMB", "CKMBINDEX", "TROPONINI" in the last 72 hours. Invalid input(s): "POCBNP" No results for input(s): "DDIMER" in the last 72 hours. No results for input(s): "HGBA1C" in the last 72 hours. No results for input(s): "CHOL", "HDL", "LDLCALC", "TRIG", "CHOLHDL", "LDLDIRECT" in the last 72 hours. No results for input(s): "TSH", "T4TOTAL", "T3FREE", "THYROIDAB" in the last 72 hours.  Invalid input(s): "FREET3" No results for input(s): "VITAMINB12", "FOLATE", "FERRITIN", "TIBC", "IRON", "RETICCTPCT" in the last 72 hours. Coags: No results for input(s): "INR" in the last 72 hours.  Invalid input(s): "PT" Microbiology: Recent Results (from the past 240 hours)  Blood Culture (routine x 2)     Status: None (Preliminary result)   Collection Time: 12/13/23  1:50 AM   Specimen: BLOOD RIGHT FOREARM  Result Value Ref Range Status   Specimen Description BLOOD RIGHT FOREARM  Final   Special Requests   Final  BOTTLES DRAWN AEROBIC AND ANAEROBIC Blood Culture results may not be optimal due to an inadequate volume of blood received in culture bottles   Culture   Final    NO GROWTH 2 DAYS Performed at [facility], [address], [place], [place]  [address]    Report Status PENDING  Incomplete  Blood Culture (routine x 2)     Status: None (Preliminary result)   Collection Time: 12/13/23  2:15 AM   Specimen: BLOOD  Result Value Ref Range Status   Specimen Description BLOOD BLOOD RIGHT WRIST  Final   Special Requests   Final    BOTTLES DRAWN AEROBIC AND ANAEROBIC Blood Culture results may not be optimal due to an inadequate volume of blood received in culture bottles   Culture   Final    NO GROWTH 2 DAYS Performed at [facility], [address] [place], [place] [address]    Report Status PENDING  Incomplete  Resp panel by RT-PCR (RSV, Flu A&B, Covid) Anterior Nasal Swab     Status: Abnormal   Collection Time: 12/13/23  2:46 AM   Specimen: Anterior Nasal Swab  Result Value Ref Range Status   SARS Coronavirus 2 by RT PCR NEGATIVE NEGATIVE Final   Influenza A by PCR POSITIVE (A) NEGATIVE Final   Influenza B by PCR NEGATIVE NEGATIVE Final    Comment: (NOTE) The Xpert Xpress SARS-CoV-2/FLU/RSV plus assay is intended as an aid in the diagnosis of influenza from Nasopharyngeal swab specimens and should not be used as a sole basis for treatment. Nasal washings and aspirates are unacceptable for Xpert Xpress SARS-CoV-2/FLU/RSV testing.  Fact Sheet for Patients: BloggerCourse.com  Fact Sheet for Healthcare Providers: SeriousBroker.it  This test is not yet approved or cleared by the [place] FDA and has been authorized for detection and/or diagnosis of SARS-CoV-2 by FDA under an Emergency Use Authorization (EUA). This EUA will remain in effect (meaning this test can be used) for the duration of the COVID-19 declaration under Section 564(b)(1) of the Act, 21 U.S.C. section 360bbb-3(b)(1), unless the authorization is terminated or revoked.     Resp Syncytial Virus by PCR NEGATIVE NEGATIVE Final    Comment: (NOTE) Fact Sheet for  Patients: BloggerCourse.com  Fact Sheet for Healthcare Providers: SeriousBroker.it  This test is not yet approved or cleared by the [place] FDA and has been authorized for detection and/or diagnosis of SARS-CoV-2 by FDA under an Emergency Use Authorization (EUA). This EUA will remain in effect (meaning this test can be used) for the duration of the COVID-19 declaration under Section 564(b)(1) of the Act, 21 U.S.C. section 360bbb-3(b)(1), unless the authorization is terminated or revoked.  Performed at [facility], [address] [place], [place] [address]     FURTHER DISCHARGE INSTRUCTIONS:  Get Medicines reviewed and adjusted: Please take all your medications with you for your next visit with your Primary MD  Laboratory/radiological data: Please request your Primary MD to go over all hospital tests and procedure/radiological results at the follow up, please ask your Primary MD to get all Hospital records sent to his/her office.  In some cases, they will be blood work, cultures and biopsy results pending at the time of your discharge. Please request that your primary care M.D. goes through all the records of your hospital data and follows up on these results.  Also Note the following: If you experience worsening of your admission symptoms, develop shortness of breath, life threatening emergency,  suicidal or homicidal thoughts you must seek medical attention immediately by calling 911 or calling your MD immediately  if symptoms less severe.  You must read complete instructions/literature along with all the possible adverse reactions/side effects for all the Medicines you take and that have been prescribed to you. Take any new Medicines after you have completely understood and accpet all the possible adverse reactions/side effects.   Do not drive when taking Pain medications or sleeping medications (Benzodaizepines)  Do  not take more than prescribed Pain, Sleep and Anxiety Medications. It is not advisable to combine anxiety,sleep and pain medications without talking with your primary care practitioner  Special Instructions: If you have smoked or chewed Tobacco  in the last 2 yrs please stop smoking, stop any regular Alcohol  and or any Recreational drug use.  Wear Seat belts while driving.  Please note: You were cared for by a hospitalist during your hospital stay. Once you are discharged, your primary care physician will handle any further medical issues. Please note that NO REFILLS for any discharge medications will be authorized once you are discharged, as it is imperative that you return to your primary care physician (or establish a relationship with a primary care physician if you do not have one) for your post hospital discharge needs so that they can reassess your need for medications and monitor your lab values.  Total Time spent coordinating discharge including counseling, education and face to face time equals greater than 30 minutes.  SignedJeoffrey Massed 12/16/2023 8:41 AM

## 2023-12-16 NOTE — Progress Notes (Addendum)
 PT Cancellation Note  Patient Details Name: Kara Mullins MRN: [id number] DOB: 05/19/1951   Cancelled Treatment:    Reason Eval/Treat Not Completed: Patient at procedure or test/unavailable  Patient currently off unit this morning for HD; will check back this afternoon as schedule permits. Otherwise will plan for follow-up tomorrow.  1352: pt still off unit. Plan to follow-up for PT visit tomorrow.  Kathlyn Sacramento, PT, DPT [facility]  Rehabilitation Services Physical Therapist Office: [phone] Website: [email or web address]  Berton Mount 12/16/2023, 9:17 AM

## 2023-12-16 NOTE — Progress Notes (Signed)
 Getting pt ready for discharge and pt stated she didn't feel well, reported feeling weak and dizzy. Glucose was checked, cbg 47. Pt is alert and talking, gave pt orange juice to drink. Pt was unable to keep orange down, she would vomit it right back up. Dr. Jerral Ralph made aware, stated to hold pt's discharge and place IV back in. Hypoglycemic protocol followed, pt give an amp of dextrose. Rechecked cbg, up to 162.

## 2023-12-16 NOTE — Progress Notes (Signed)
 Received patient in bed to unit.  Alert and oriented.  Informed consent signed and in chart.   TX duration:3.5  Patient tolerated well.  Transported back to the room  Alert, without acute distress.  Hand-off given to patient's nurse.   Access used: LAVF Access issues: none  Total UF removed: 1.5L Medication(s) given: pre tx heparin bolus   12/16/23 1314  Vitals  Temp 97.9 F (36.6 C)  BP (!) 185/65  Pulse Rate 67  During Treatment Monitoring  Duration of HD Treatment -hour(s) 3.5 hour(s)  Cumulative Fluid Removed (mL) per Treatment  1500  HD Safety Checks Performed Yes  Intra-Hemodialysis Comments Tx completed  Dialysis Fluid Bolus Normal Saline  Bolus Amount (mL) 300 mL  Post Treatment  Dialyzer Clearance Lightly streaked  Liters Processed 84  Fluid Removed (mL) 1500 mL  Tolerated HD Treatment Yes  AVG/AVF Arterial Site Held (minutes) 5 minutes  AVG/AVF Venous Site Held (minutes) 5 minutes  Fistula / Graft Left Upper arm Arteriovenous fistula  Placement Date/Time: 03/27/23 1441   Orientation: Left  Access Location: Upper arm  Access Type: Arteriovenous fistula  Site Condition No complications  Fistula / Graft Assessment Present;Thrill;Bruit  Status Deaccessed  Needle Size 15  Drainage Description None      Freddi Starr, RN [facility] Dialysis Unit

## 2023-12-16 NOTE — Progress Notes (Signed)
  [facility] Progress Note   Subjective: Seen and examined in K[facility]. No new events. For discharge after HD.   Objective Vitals:   12/15/23 1600 12/15/23 2000 12/16/23 0000 12/16/23 0400  BP: (!) 140/99     Pulse: (!) 56     Resp: 15     Temp:  98 F (36.7 C) 98 F (36.7 C) 98.7 F (37.1 C)  TempSrc:  Oral Oral Oral  SpO2: 97%     Weight:         Additional Objective Labs: Basic Metabolic Panel: Recent Labs  Lab 12/14/23 0540 12/15/23 0502 12/16/23 0445  NA 137 134* 132*  K 3.3* 4.2 4.1  CL 95* 98 99  CO2 27 24 24   GLUCOSE 69* 70 57*  BUN 12 20 25*  CREATININE 2.28* 3.15* 3.83*  CALCIUM 8.4* 7.6* 7.4*  PHOS 2.6 2.9 2.9   CBC: Recent Labs  Lab 12/13/23 0044 12/14/23 0540  WBC 2.0* 2.0*  NEUTROABS 1.3*  --   HGB 10.7* 11.0*  HCT 35.5* 35.7*  MCV 93.9 91.3  PLT 106* 99*   Blood Culture    Component Value Date/Time   SDES BLOOD BLOOD RIGHT WRIST 12/13/2023 0215   SPECREQUEST  12/13/2023 0215    BOTTLES DRAWN AEROBIC AND ANAEROBIC Blood Culture results may not be optimal due to an inadequate volume of blood received in culture bottles   CULT  12/13/2023 0215    NO GROWTH 2 DAYS Performed at [facility] Lab, [address] [place], [place] [address]    REPTSTATUS PENDING 12/13/2023 0215     Physical Exam General: Elderly woman, frail, nad Heart: RRR Lungs: Diminished, normal wob Abdomen: soft non-tender Extremities: no LE edema  Dialysis Access: L AVF + bruit   Medications:  cefTRIAXone (ROCEPHIN)  IV      Chlorhexidine Gluconate Cloth  6 each Topical Q0600   feeding supplement (NEPRO CARB STEADY)  237 mL Oral BID BM   fluticasone  2 spray Each Nare Daily   guaiFENesin  600 mg Oral BID   heparin  5,000 Units Subcutaneous Q8H   heparin sodium (porcine)       lidocaine-prilocaine   Topical Q T,Th,Sa-HD   multivitamin  1 tablet Oral Once per day on Monday Wednesday Friday Saturday   pantoprazole  40 mg Oral Q1200   sevelamer  carbonate  800 mg Oral TID WC   silver sulfADIAZINE  1 Application Topical Daily   sodium chloride flush  3 mL Intravenous Q12H    D[facility] Orders:  [facility]  3 hours 45 minutes.  F180.  EDW 47 kg.  aVF, 16-gauge.  Flow rates: 350/autoflow 1.5.  3K, 3 calcium.  Heparin: 4000 units bolus.  Meds: None for now.  Last dose of Mircera was 75 mcg on 2/11 -HD per TTS sched  Assessment/Plan: ESRD - HD TTS. Continue on schedule Sepsis - 2/2 pna/influenza.  Influenza A  - per primary  Acute metabolic encephalopathy - resolved  HTN/volume- BP/volume ok  Anemia-  Hgb at goal. No ESA needs  MBD -  Ca/Phos acceptable. Continue home binders  Nutrition - Cont protein supp for low albumin  Hep B SAg+ on 12/13/23 - s/p Hep B vaccination on 2/20. Felt to be transient response 2/2 immunization. Hep B core AB neg -no active infection  Tomasa Blase [name] [facility] 12/16/2023,9:07 AM

## 2023-12-16 NOTE — Plan of Care (Signed)
 Pt has rested quietly throughout the night with no distress noted. Alert and oriented. On 1LNC for pt comfort. SR on the monitor. Oliguric. No complaints voiced. Family member at bedside.     Problem: Education: Goal: Knowledge of General Education information will improve Description: Including pain rating scale, medication(s)/side effects and non-pharmacologic comfort measures Outcome: Progressing   Problem: Clinical Measurements: Goal: Will remain free from infection Outcome: Progressing Goal: Respiratory complications will improve Outcome: Progressing   Problem: Activity: Goal: Risk for activity intolerance will decrease Outcome: Progressing   Problem: Nutrition: Goal: Adequate nutrition will be maintained Outcome: Progressing   Problem: Pain Managment: Goal: General experience of comfort will improve and/or be controlled Outcome: Progressing   Problem: Safety: Goal: Ability to remain free from injury will improve Outcome: Progressing   Problem: Skin Integrity: Goal: Risk for impaired skin integrity will decrease Outcome: Progressing

## 2023-12-16 NOTE — Progress Notes (Signed)
 SLP Cancellation Note  Patient Details Name: Kara Mullins MRN: [id number] DOB: 1951/01/08   Cancelled treatment:       Reason Eval/Treat Not Completed: Patient at procedure or test/unavailable. Pt has been in HD today. Will f/u as able.     Mahala Menghini., M.A. CCC-SLP Acute Rehabilitation Services Office [phone]  Secure chat preferred  12/16/2023, 1:50 PM

## 2023-12-16 NOTE — Progress Notes (Signed)
 D/C order noted. Contacted [facility] and spoke to Tax adviser. Clinic manager aware pt will d/c today and resume on Thursday. Clinic manager also aware pt's Hep B antigen was positive on 3/1 due to Hep B vaccination on 2/20 per nephrology note (see renal note for details).   Olivia Canter Renal Navigator [phone]

## 2023-12-17 DIAGNOSIS — E861 Hypovolemia: Secondary | ICD-10-CM

## 2023-12-17 LAB — RENAL FUNCTION PANEL
Albumin: 2.4 g/dL — ABNORMAL LOW (ref 3.5–5.0)
Anion gap: 10 (ref 5–15)
BUN: 16 mg/dL (ref 8–23)
CO2: 30 mmol/L (ref 22–32)
Calcium: 7.8 mg/dL — ABNORMAL LOW (ref 8.9–10.3)
Chloride: 95 mmol/L — ABNORMAL LOW (ref 98–111)
Creatinine, Ser: 3.21 mg/dL — ABNORMAL HIGH (ref 0.44–1.00)
GFR, Estimated: 15 mL/min — ABNORMAL LOW (ref >60)
Glucose, Bld: 83 mg/dL (ref 70–99)
Phosphorus: 2.5 mg/dL (ref 2.5–4.6)
Potassium: 4 mmol/L (ref 3.5–5.1)
Sodium: 135 mmol/L (ref 135–145)

## 2023-12-17 LAB — GLUCOSE, CAPILLARY: Glucose-Capillary: 101 mg/dL — ABNORMAL HIGH (ref 70–99)

## 2023-12-17 NOTE — Discharge Planning (Signed)
 [facility] Patient Discharge Orders- [facility] CLINIC: [place]  Patient's name: Kara Mullins Admit/DC Dates: 12/13/2023 - 12/17/2023  Discharge Diagnoses: Sepsis 2/2 PNA/influenza A infection   ARF   Recent Labs  Lab 12/16/23 1007 12/17/23 0458  HGB 11.3*  --   K  --  4.0  CALCIUM  --  7.8*  PHOS  --  2.5  ALBUMIN  --  2.4*   Aranesp: Given: --   Date of last dose/amount: --   PRBC's Given: -- Date/# of units: -- ESA dose for discharge: per protocol   Outpatient Dialysis Orders:  -Heparin: No change  -EDW: 47 kg    -Bath: No change   Access intervention/Change: --   -IV Antibiotics: --   OTHER/APPTS    Completed by: Tomasa Blase [name]   D/C Meds to be reconciled by nurse after every discharge.    Reviewed by: MD:______ RN_______

## 2023-12-17 NOTE — Progress Notes (Addendum)
 DISCHARGE NOTE HOME Travonna Swindle to be discharged Home per MD order. Discussed prescriptions and follow up appointments with the patient. Prescriptions given to patient; medication list explained in detail. Patient verbalized understanding.  Skin clean, dry and intact without evidence of skin break down, no evidence of skin tears noted. IV catheter discontinued intact. Site without signs and symptoms of complications. Dressing and pressure applied. Pt denies pain at the site currently. No complaints noted.  Patient free of lines, drains, and wounds other than noted on LDA.  An After Visit Summary (AVS) was printed and given to the patient.  Patient and family in room dressing and will contact nurses desk when ready to leave. Patient will be escorted via wheelchair, and discharged home via private auto.  Velia Meyer, RN

## 2023-12-17 NOTE — Progress Notes (Signed)
 Occupational Therapy Treatment Patient Details Name: Chimere Klingensmith MRN: [id number] DOB: Dec 20, 1950 Today's Date: 12/17/2023   History of present illness The pt is a 73 yo female presenting 3/1 with AMS and hypoxia (SpO2 in 60s). Work up revealed influenza A, and sepsis secondary to PNA. PMH includes: ESRD on HD, HTN, PVD, and CHF.   OT comments  Pt progressing towards goals. Focus of session was activity tolerance sit<>stand UB and LB dressing as well as seated grooming tasks, WC transfer with stedy (Pt has one at home) Educated on importance of participating in functional ADL as a form of exercise and increasing activity tolerance. Educated on use of pulse-oximeter as Pt was anxious about "not being able to breathe" DOE 2/4 with activity SpO2 >95% on RA throughout session. Daughter present and incredibly helpful and attentive. POC remains appropriate at this time.       If plan is discharge home, recommend the following:  A lot of help with walking and/or transfers;A lot of help with bathing/dressing/bathroom;Assistance with cooking/housework;Help with stairs or ramp for entrance;Assist for transportation   Equipment Recommendations  Tub/shower bench    Recommendations for Other Services      Precautions / Restrictions Precautions Precautions: Fall Recall of Precautions/Restrictions: Intact Restrictions Weight Bearing Restrictions Per Provider Order: No       Mobility Bed Mobility Overal bed mobility: Needs Assistance Bed Mobility: Supine to Sit     Supine to sit: Mod assist, HOB elevated     General bed mobility comments: minA to LE and modA to trunk with cues for movement of each extremity, increased time, and support of trunk to transition to sitting    Transfers Overall transfer level: Needs assistance Equipment used: Ambulation equipment used Transfers: Sit to/from Stand Sit to Stand: Min assist, +2 safety/equipment, From elevated surface           General  transfer comment: Pt has a stedy at home, min A from elevated surface Transfer via Lift Equipment: Stedy   Balance Overall balance assessment: Needs assistance Sitting-balance support: Bilateral upper extremity supported, Feet unsupported Sitting balance-Leahy Scale: Fair Sitting balance - Comments: UE support on bed or stedy, no overt LOB   Standing balance support: Bilateral upper extremity supported, During functional activity Standing balance-Leahy Scale: Poor                             ADL either performed or assessed with clinical judgement   ADL Overall ADL's : Needs assistance/impaired Eating/Feeding: Set up;Sitting   Grooming: Wash/dry face;Set up;Sitting Grooming Details (indicate cue type and reason): in stedy seated position         Upper Body Dressing : Moderate assistance;Sitting Upper Body Dressing Details (indicate cue type and reason): EOB Lower Body Dressing: Maximal assistance;Bed level;Sit to/from stand Lower Body Dressing Details (indicate cue type and reason): initiated bed level to thread feet, then after Pt in standing OT pulled up pants. Toilet Transfer: Minimal assistance;+2 for safety/equipment Toilet Transfer Details (indicate cue type and reason): in stedy, Pt has one at home   Toileting - Clothing Manipulation Details (indicate cue type and reason): Pt wears diapers at baseline     Functional mobility during ADLs: Minimal assistance;+2 for safety/equipment (stedy) General ADL Comments: decreased activity tolerance, SOB but SPO2 [facility]    Extremity/Trunk Assessment              Vision       Perception  Praxis     Communication Communication Communication: No apparent difficulties   Cognition Arousal: Alert Behavior During Therapy: Flat affect                                 Following commands: Intact        Cueing   Cueing Techniques: Verbal cues  Exercises      Shoulder Instructions        General Comments SpO2 WFL on RA, Pt DOE 2/3 - encouraged her by showing her 100% saturation    Pertinent Vitals/ Pain       Pain Assessment Pain Assessment: No/denies pain  Home Living                                          Prior Functioning/Environment              Frequency  Min 1X/week        Progress Toward Goals  OT Goals(current goals can now be found in the care plan section)  Progress towards OT goals: Progressing toward goals  Acute Rehab OT Goals Patient Stated Goal: go home OT Goal Formulation: With patient Time For Goal Achievement: 12/28/23 Potential to Achieve Goals: Good  Plan      Co-evaluation                 AM-PAC OT "6 Clicks" Daily Activity     Outcome Measure   Help from another person eating meals?: None Help from another person taking care of personal grooming?: A Little Help from another person toileting, which includes using toliet, bedpan, or urinal?: A Lot Help from another person bathing (including washing, rinsing, drying)?: A Lot Help from another person to put on and taking off regular upper body clothing?: A Little Help from another person to put on and taking off regular lower body clothing?: Total 6 Click Score: 15    End of Session Equipment Utilized During Treatment: Gait belt;Other (comment) (stedy)  OT Visit Diagnosis: Unsteadiness on feet (R26.81);Muscle weakness (generalized) (M62.81);Other abnormalities of gait and mobility (R26.89)   Activity Tolerance Patient tolerated treatment well   Patient Left Other (comment);with family/visitor present (in personal WC)   Nurse Communication Mobility status        Time: 1610-9604 OT Time Calculation (min): 35 min  Charges: OT General Charges $OT Visit: 1 Visit OT Treatments $Self Care/Home Management : 8-22 mins  Nyoka Cowden OTR/L Acute Rehabilitation Services Office: [phone]  Evern Bio [facility] 12/17/2023, 12:08 PM

## 2023-12-17 NOTE — Plan of Care (Signed)
 Pt has rested quietly throughout the night with no distress noted. Alert and oriented. On O2 1.5L  for pt comfort and SOA. SR on the monitor. No urine. Drinking and eating fine. Family member at bedside. No complaints voiced.     Problem: Education: Goal: Knowledge of General Education information will improve Description: Including pain rating scale, medication(s)/side effects and non-pharmacologic comfort measures Outcome: Progressing   Problem: Clinical Measurements: Goal: Respiratory complications will improve Outcome: Progressing   Problem: Activity: Goal: Risk for activity intolerance will decrease Outcome: Progressing   Problem: Nutrition: Goal: Adequate nutrition will be maintained Outcome: Progressing   Problem: Coping: Goal: Level of anxiety will decrease Outcome: Progressing   Problem: Pain Managment: Goal: General experience of comfort will improve and/or be controlled Outcome: Progressing

## 2023-12-17 NOTE — Progress Notes (Signed)
 Speech Language Pathology Treatment: Dysphagia  Patient Details Name: Kara Mullins MRN: [id number] DOB: 07-Sep-1951 Today's Date: 12/17/2023 Time: 4098-1191 SLP Time Calculation (min) (ACUTE ONLY): 16 min  Assessment / Plan / Recommendation Clinical Impression  Pt remembered participating in MBS the other day, but did not necessarily provide details when asked to explain her understanding of results. Daughter also now present so SLP provided education about results and recommendations, including use of esophageal precautions an consideration for OP GI referral once recovered from acute illness. Pt does state that she often lays down right after eating, and says that she will work on staying upright longer. No diet modifications recommended, but did encourage her to make sure she is selecting foods that she feels are soft enough to masticate. During today's visit, she declined the graham crackers because they were too hard, but she consumed thin liquids without overt difficulty (just one delayed throat clear). SLP to sign off acutely as all education has been completed and pt is approaching discharge.    HPI HPI: Kara Mullins is a 73 yo female presenting to ED 3/1 with AMS. Recently diagnosed with Influenza A 2/25. CT Chest shows small bilateral pleural effusions and consolidative changes in both lower lobes, atelectasis vs superimposed multifocal PNA. Previously seen by SLP and ultimately recommended to continue regular diet with thin liquids. PMH includes ESRD on HD, HTN, HLD, chronic combined systolic/diastolic heart failure      SLP Plan  All goals met      Recommendations for follow up therapy are one component of a multi-disciplinary discharge planning process, led by the attending physician.  Recommendations may be updated based on patient status, additional functional criteria and insurance authorization.    Recommendations  Diet recommendations: Regular;Thin liquid Liquids provided  via: Cup;Straw Medication Administration: Whole meds with liquid Supervision: Patient able to self feed;Intermittent supervision to cue for compensatory strategies Compensations: Minimize environmental distractions;Slow rate;Small sips/bites;Follow solids with liquid Postural Changes and/or Swallow Maneuvers: Seated upright 90 degrees;Upright 30-60 min after meal                  Oral care BID     Dysphagia, unspecified (R13.10)     All goals met     Mahala Menghini., M.A. CCC-SLP Acute Rehabilitation Services Office [phone]  Secure chat preferred   12/17/2023, 10:15 AM

## 2023-12-17 NOTE — Progress Notes (Signed)
 D/C order noted. Contacted [facility] to be advised that pt did not d/c yesterday but to d/c today. Clinic advised pt should resume care tomorrow.   Olivia Canter Renal Navigator [phone]

## 2023-12-17 NOTE — Progress Notes (Signed)
 Physical Therapy Treatment Patient Details Name: Kara Mullins MRN: [id number] DOB: [birth year] Today's Date: 12/17/2023   History of Present Illness The pt is a 73 yo female presenting 3/1 with AMS and hypoxia (SpO2 in 60s). Work up revealed influenza A, and sepsis secondary to PNA. PMH includes: ESRD on HD, HTN, PVD, and CHF.    PT Comments  Pt seen for PT tx with co-tx with OT; pt's daughter present for session. Pt requires mod assist for bed mobility with pt demonstrating decreased initiation with movements. Pt is able to transfer STS from elevated EOB or stedy seat with CGA<>Min assist +2. Pt demonstrates decreased standing tolerance in stedy (<30 seconds) & significantly relies on BUE as pt leans on BUE on stedy bar despite encouragement to attempt upright posture. Pt with c/o SOB despite SpO2 >90% on room air. Pt's daughter reporting pt has had experience with sacral wounds in the past 2/2 prolonged sitting at dialysis & inquiring about cushions - PT educated her on roho cushions & encouraged her to f/u with pt's PCP.     If plan is discharge home, recommend the following: A little help with walking and/or transfers;A little help with bathing/dressing/bathroom;Assistance with cooking/housework;Direct supervision/assist for medications management;Assist for transportation;Direct supervision/assist for financial management;Help with stairs or ramp for entrance   Can travel by private vehicle        Equipment Recommendations  None recommended by PT    Recommendations for Other Services       Precautions / Restrictions Precautions Precautions: Fall Restrictions Weight Bearing Restrictions Per Provider Order: No     Mobility  Bed Mobility Overal bed mobility: Needs Assistance Bed Mobility: Supine to Sit     Supine to sit: Mod assist, HOB elevated, Used rails (cuing for each step, assistance to move BLE off EOB, upright trunk)          Transfers Overall transfer level: Needs  assistance Equipment used: Ambulation equipment used Transfers: Sit to/from Stand Sit to Stand: Min assist, Contact guard assist, +2 safety/equipment, From elevated surface, Via lift equipment           General transfer comment: Pt performed multiple STS from elevated EOB & stedy with CGA<>min assist +2.    Ambulation/Gait                   Stairs             Wheelchair Mobility     Tilt Bed    Modified Rankin (Stroke Patients Only)       Balance Overall balance assessment: Needs assistance Sitting-balance support: Bilateral upper extremity supported, Feet unsupported Sitting balance-Leahy Scale: Fair     Standing balance support: Bilateral upper extremity supported, During functional activity   Standing balance comment: significant BUE support on steady, leaning over on bar, difficulty uprighting trunk                            Communication Communication Communication: No apparent difficulties  Cognition Arousal: Alert Behavior During Therapy: Flat affect   PT - Cognitive impairments: Initiation, Problem solving, Safety/Judgement                           Following commands impaired: Only follows one step commands consistently, Follows one step commands with increased time    Cueing Cueing Techniques: Verbal cues  Exercises      General Comments General comments (skin  integrity, edema, etc.): Pt on room air with SpO2 >90% despite pt with c/o SOB, PT educated pt on pursed lip breathing.      Pertinent Vitals/Pain Pain Assessment Pain Assessment: No/denies pain    Home Living                          Prior Function            PT Goals (current goals can now be found in the care plan section) Acute Rehab PT Goals Patient Stated Goal: return home, improve ability to pivot PT Goal Formulation: With patient/family Time For Goal Achievement: 12/28/23 Potential to Achieve Goals: Good Progress towards PT  goals: Progressing toward goals    Frequency    Min 1X/week      PT Plan      Co-evaluation PT/OT/SLP Co-Evaluation/Treatment: Yes Reason for Co-Treatment: Other (comment) (pt with decreased activity tolerance) PT goals addressed during session: Mobility/safety with mobility;Balance;Strengthening/ROM        AM-PAC PT "6 Clicks" Mobility   Outcome Measure  Help needed turning from your back to your side while in a flat bed without using bedrails?: A Little Help needed moving from lying on your back to sitting on the side of a flat bed without using bedrails?: A Lot Help needed moving to and from a bed to a chair (including a wheelchair)?: A Lot Help needed standing up from a chair using your arms (e.g., wheelchair or bedside chair)?: A Little Help needed to walk in hospital room?: Total Help needed climbing 3-5 steps with a railing? : Total 6 Click Score: 12    End of Session   Activity Tolerance: Patient limited by fatigue;Patient tolerated treatment well Patient left: in chair;with family/visitor present (in pt's personal w/c) Nurse Communication: Mobility status PT Visit Diagnosis: Muscle weakness (generalized) (M62.81);Other abnormalities of gait and mobility (R26.89)     Time: 1610-9604 PT Time Calculation (min) (ACUTE ONLY): 29 min  Charges:    $Therapeutic Activity: 8-22 mins PT General Charges $$ ACUTE PT VISIT: 1 Visit                     Aleda Grana, PT, DPT 12/17/23, 12:35 PM   Sandi Mariscal 12/17/2023, 12:33 PM

## 2023-12-17 NOTE — Progress Notes (Signed)
  Patient was seen and examined. She was ready to discharge yesterday but after HD she became SOB and was nauseated . Relieved with rest and now feels better. Still has some episode of congestion but denies any complaints. On room air. Glucose is adequate. Feels comfortable going home with her daughter.  Discharge summary reviewed as done by Dr. Jerral Ralph 3/4. No changes needed . Next dialysis tomorrow.    No charge visit.

## 2023-12-18 ENCOUNTER — Telehealth: Payer: Self-pay

## 2023-12-18 ENCOUNTER — Telehealth: Payer: Self-pay | Admitting: Nephrology

## 2023-12-18 DIAGNOSIS — D631 Anemia in chronic kidney disease: Secondary | ICD-10-CM | POA: Diagnosis not present

## 2023-12-18 DIAGNOSIS — N2581 Secondary hyperparathyroidism of renal origin: Secondary | ICD-10-CM | POA: Diagnosis not present

## 2023-12-18 DIAGNOSIS — N181 Chronic kidney disease, stage 1: Secondary | ICD-10-CM | POA: Diagnosis not present

## 2023-12-18 DIAGNOSIS — Z992 Dependence on renal dialysis: Secondary | ICD-10-CM | POA: Diagnosis not present

## 2023-12-18 DIAGNOSIS — N186 End stage renal disease: Secondary | ICD-10-CM | POA: Diagnosis not present

## 2023-12-18 LAB — CULTURE, BLOOD (ROUTINE X 2)
Culture: NO GROWTH
Culture: NO GROWTH

## 2023-12-18 NOTE — Transitions of Care (Post Inpatient/ED Visit) (Signed)
   12/18/2023  Name: Monserratt Knezevic MRN: [id number] DOB: 10/24/1950  Today's TOC FU Call Status: Today's TOC FU Call Status:: Unsuccessful Call (1st Attempt) Unsuccessful Call (1st Attempt) Date: 12/18/23  Attempted to reach the patient regarding the most recent Inpatient/ED visit.  Follow Up Plan: Additional outreach attempts will be made to reach the patient to complete the Transitions of Care (Post Inpatient/ED visit) call.   Hilbert Odor RN, CCM [place]  VBCI-Population Health RN Care Manager [phone]

## 2023-12-18 NOTE — Telephone Encounter (Signed)
 Transition of care contact from inpatient facility  Date of Discharge: 12/17/23 Date of Contact: 12/18/23 Method of contact: Phone  Attempted to contact patient to discuss transition of care from inpatient admission. Patient did not answer the phone. Will continue outreach attempts.

## 2023-12-19 ENCOUNTER — Telehealth: Payer: Self-pay

## 2023-12-19 NOTE — Transitions of Care (Post Inpatient/ED Visit) (Signed)
   12/19/2023  Name: Chassity Ludke MRN: [id number] DOB: Jun 26, 1951  Today's TOC FU Call Status: Today's TOC FU Call Status:: Unsuccessful Call (2nd Attempt) Unsuccessful Call (2nd Attempt) Date: 12/19/23  Attempted to reach the patient regarding the most recent Inpatient/ED visit.  Follow Up Plan: Additional outreach attempts will be made to reach the patient to complete the Transitions of Care (Post Inpatient/ED visit) call.   Hilbert Odor RN, CCM [place]  VBCI-Population Health RN Care Manager [phone]

## 2023-12-20 ENCOUNTER — Other Ambulatory Visit: Payer: Self-pay

## 2023-12-20 ENCOUNTER — Encounter (HOSPITAL_COMMUNITY): Payer: Self-pay

## 2023-12-20 ENCOUNTER — Emergency Department (HOSPITAL_COMMUNITY): Payer: Medicare (Managed Care)

## 2023-12-20 ENCOUNTER — Emergency Department (HOSPITAL_COMMUNITY)
Admission: EM | Admit: 2023-12-20 | Discharge: 2023-12-20 | Disposition: A | Discharge status: Home / Self Care | Payer: Medicare (Managed Care) | Attending: Emergency Medicine | Admitting: Emergency Medicine

## 2023-12-20 DIAGNOSIS — I132 Hypertensive heart and chronic kidney disease with heart failure and with stage 5 chronic kidney disease, or end stage renal disease: Secondary | ICD-10-CM | POA: Insufficient documentation

## 2023-12-20 DIAGNOSIS — Z79899 Other long term (current) drug therapy: Secondary | ICD-10-CM | POA: Insufficient documentation

## 2023-12-20 DIAGNOSIS — N186 End stage renal disease: Secondary | ICD-10-CM | POA: Diagnosis not present

## 2023-12-20 DIAGNOSIS — Z992 Dependence on renal dialysis: Secondary | ICD-10-CM | POA: Insufficient documentation

## 2023-12-20 DIAGNOSIS — R0602 Shortness of breath: Secondary | ICD-10-CM | POA: Diagnosis present

## 2023-12-20 DIAGNOSIS — I2693 Single subsegmental pulmonary embolism without acute cor pulmonale: Secondary | ICD-10-CM | POA: Insufficient documentation

## 2023-12-20 DIAGNOSIS — I509 Heart failure, unspecified: Secondary | ICD-10-CM | POA: Insufficient documentation

## 2023-12-20 LAB — RESP PANEL BY RT-PCR (RSV, FLU A&B, COVID)  RVPGX2
Influenza A by PCR: POSITIVE — AB
Influenza B by PCR: NEGATIVE
Resp Syncytial Virus by PCR: NEGATIVE
SARS Coronavirus 2 by RT PCR: NEGATIVE

## 2023-12-20 LAB — COMPREHENSIVE METABOLIC PANEL
ALT: 18 U/L (ref 0–44)
AST: 30 U/L (ref 15–41)
Albumin: 2.7 g/dL — ABNORMAL LOW (ref 3.5–5.0)
Alkaline Phosphatase: 67 U/L (ref 38–126)
Anion gap: 11 (ref 5–15)
BUN: 5 mg/dL — ABNORMAL LOW (ref 8–23)
CO2: 28 mmol/L (ref 22–32)
Calcium: 9 mg/dL (ref 8.9–10.3)
Chloride: 98 mmol/L (ref 98–111)
Creatinine, Ser: 1.84 mg/dL — ABNORMAL HIGH (ref 0.44–1.00)
GFR, Estimated: 29 mL/min — ABNORMAL LOW (ref >60)
Glucose, Bld: 93 mg/dL (ref 70–99)
Potassium: 3.5 mmol/L (ref 3.5–5.1)
Sodium: 137 mmol/L (ref 135–145)
Total Bilirubin: 0.6 mg/dL (ref 0.0–1.2)
Total Protein: 7.3 g/dL (ref 6.5–8.1)

## 2023-12-20 LAB — CBC
HCT: 36 % (ref 36.0–46.0)
Hemoglobin: 11 g/dL — ABNORMAL LOW (ref 12.0–15.0)
MCH: 27.9 pg (ref 26.0–34.0)
MCHC: 30.6 g/dL (ref 30.0–36.0)
MCV: 91.4 fL (ref 80.0–100.0)
Platelets: 61 10*3/uL — ABNORMAL LOW (ref 150–400)
RBC: 3.94 MIL/uL (ref 3.87–5.11)
RDW: 15.2 % (ref 11.5–15.5)
WBC: 2 10*3/uL — ABNORMAL LOW (ref 4.0–10.5)
nRBC: 0 % (ref 0.0–0.2)

## 2023-12-20 LAB — BRAIN NATRIURETIC PEPTIDE: B Natriuretic Peptide: 413.1 pg/mL — ABNORMAL HIGH (ref 0.0–100.0)

## 2023-12-20 LAB — TROPONIN I (HIGH SENSITIVITY)
Troponin I (High Sensitivity): 4 ng/L (ref <18)
Troponin I (High Sensitivity): 4 ng/L (ref <18)

## 2023-12-20 MED ORDER — APIXABAN (ELIQUIS) VTE STARTER PACK (10MG AND 5MG): ORAL_TABLET | ORAL | 0 refills | Status: DC

## 2023-12-20 MED ORDER — APIXABAN 5 MG PO TABS
10.0000 mg | ORAL_TABLET | Freq: Once | ORAL | Status: AC
  Administered 2023-12-20: 10 mg via ORAL
  Filled 2023-12-20: qty 2

## 2023-12-20 MED ORDER — IOHEXOL 350 MG/ML SOLN
75.0000 mL | Freq: Once | INTRAVENOUS | Status: AC | PRN
  Administered 2023-12-20: 75 mL via INTRAVENOUS

## 2023-12-20 NOTE — Discharge Instructions (Addendum)
 You have been seen and discharged from the emergency department.  You were diagnosed with a small peripheral blood clot in the lung.  This is not causing any effect on the heart and lungs otherwise.  This is treated with oral blood thinning medicine.  Pharmacy recommends placing you on Eliquis.  You were given your first dose here.  Fill the prescription and you may take as directed starting tomorrow morning.  Follow-up with your primary provider for further evaluation and further care. Take home medications as prescribed. If you have any worsening symptoms, worsening shortness of breath, difficulty breathing, severe chest pain or further concerns for your health please return to an emergency department for further evaluation.

## 2023-12-20 NOTE — ED Provider Notes (Signed)
 Patient signed out to me by previous provider. Please refer to their note for full HPI.  Briefly this is a 73 year old female with recent admission for flu/pneumonia who presented to the emergency department with ongoing shortness of breath.  Patient has normal oxygenation on room air, no acute respiratory distress.  Workup was reassuring and chest x-ray showed no focal finding so we are pursuing a CT PE study to rule out pulmonary embolism given the recent admission.  CT PE study identifies a right subsegmental pulmonary embolism with no other findings of heart strain/failure.  It also identifies a stable right lower lung changes as well as improved area of consolidation in the left lower lung.  On reevaluation patient is sleeping comfortably, she has normal oxygenation, no tachycardia, stable blood pressure.  She does not usually ambulate, she uses a wheelchair.  At this time there is no indication for emergent admission or IV anticoagulation.    Consulted with pharmacy given her kidney function, ESRD on HD for which agent would be best in the setting of PE.  It is recommended for full dose Eliquis, first dose given here and prescription sent.  Daughter at bedside educated and agrees with discharge plan.  Patient at this time appears safe and stable for discharge and close outpatient follow up. Discharge plan and strict return to ED precautions discussed, patient verbalizes understanding and agreement.   Rozelle Logan, DO 12/20/23 2057

## 2023-12-20 NOTE — ED Provider Triage Note (Signed)
 Emergency Medicine Provider Triage Evaluation Note  Iyesha Such , a 73 y.o. female  was evaluated in triage.  Pt complains of SOB.  Patient's family is at bedside and states that she is admitted to the hospital 3/1 for flu and pneumonia.  Was discharged on 3/4 and has been feeling short of breath since being discharged.  She is able to speak in full sentences.  She is not on oxygen at baseline.  No tobacco use history, COPD, or asthma.  Review of Systems  Positive: Shortness of breath Negative: Chest pain, fevers  Physical Exam  BP 122/66 (BP Location: Right Arm)   Pulse 64   Resp 18   Ht 5\' 5"  (1.651 m)   Wt 49.9 kg   SpO2 100%   BMI 18.31 kg/m  Gen:   Awake, no distress   Resp:  Normal effort  MSK:   Moves extremities without difficulty  Other:    Medical Decision Making  Medically screening exam initiated at 12:43 PM.  Appropriate orders placed.  Emmylou Bieker was informed that the remainder of the evaluation will be completed by another provider, this initial triage assessment does not replace that evaluation, and the importance of remaining in the ED until their evaluation is complete.     Maxwell Marion, [name] 12/20/23 1244

## 2023-12-20 NOTE — ED Triage Notes (Signed)
 Pt c/o SOB, but pt can't tell me how long she's had it. Pt is eupneic.

## 2023-12-20 NOTE — ED Provider Notes (Signed)
 [place] EMERGENCY DEPARTMENT AT [facility] Provider Note   CSN: [id number] Arrival date & time: 12/20/23  1216     History  Chief Complaint  Patient presents with   Shortness of Breath    Kara Mullins is a 73 y.o. female.  Patient is a 73 year old female with a past medical history of ESRD on TTS HD, CHF, hypertension and recent admission for the flu and pneumonia presenting to the emergency department with shortness of breath.  Patient states that since being discharged she feels like her breathing is not back to normal and feels like she "just cannot breathe".  She states that this got worse since yesterday.  She did complete her full dialysis session today and reported no improvement of her symptoms after dialysis.  She states that they do not take off any extra fluid.  She states that she has had no fever or cough.  Denies any associated chest pain.  States she is having some mild swelling in her feet and ankles.  Denies any blood thinner use or prior history of VTE but was recently admitted for 4 days.  The history is provided by the patient and a relative.  Shortness of Breath      Home Medications Prior to Admission medications   Medication Sig Start Date End Date Taking? Authorizing Provider  carvedilol (COREG) 3.125 MG tablet Take 3.125 mg by mouth 2 (two) times daily.    [provider]  fluticasone (FLONASE) 50 MCG/ACT nasal spray Place 2 sprays into both nostrils daily. 12/09/23   [name], [name]  furosemide (LASIX) 80 MG tablet Take 1 tablet (80 mg total) by mouth every Monday, Wednesday, Friday and Sunday. 11/20/23   [name], [name], NP  isosorbide mononitrate (IMDUR) 30 MG 24 hr tablet Take 1 tablet (30 mg total) by mouth daily. Patient taking differently: Take 30 mg by mouth See admin instructions. Take 1 tablet by mouth every Mon, Wed, Fri, Sat 07/03/23   [name], [name], NP  lidocaine-prilocaine (EMLA) cream APPLY 1 APPLICATION  TOPICALLY AS NEEDED.(USE 30 MINUTES PRIOR TO DIALYSIS). Patient taking differently: Apply 1 Application topically as needed (for port access). 10/13/23   [name], [name], NP  Menthol-Methyl Salicylate (MUSCLE RUB) 10-15 % CREA Apply 1 Application topically daily as needed for muscle pain.    [provider]  multivitamin (RENA-VIT) TABS tablet Take 1 tablet by mouth daily. Patient taking differently: Take 1 tablet by mouth See admin instructions. Take 1 tablet by mouth every Mon, Wed, Fri, Sat 06/19/23   [name], [name], NP  OVER THE COUNTER MEDICATION Take 1 fluid ounce by mouth daily. Liquid Cell Protein    [provider]  sevelamer carbonate (RENVELA) 800 MG tablet Take 1 tablet (800 mg total) by mouth 3 (three) times daily with meals. Patient not taking: Reported on 12/13/2023 08/29/23   [name], [name], NP  silver sulfADIAZINE (SILVADENE) 1 % cream Apply 1 Application topically daily. 12/02/23   [name], [name]      Allergies    Crestor [rosuvastatin] and Nitrofuran derivatives    Review of Systems   Review of Systems  Respiratory:  Positive for shortness of breath.     Physical Exam Updated Vital Signs BP (!) 174/86   Pulse (!) 59   Resp 14   Ht 5\' 5"  (1.651 m)   Wt 49.9 kg   SpO2 100%   BMI 18.31 kg/m  Physical Exam Vitals and nursing note  reviewed.  Constitutional:      General: She is not in acute distress.    Appearance: She is well-developed.  HENT:     Head: Normocephalic and atraumatic.     Mouth/Throat:     Mouth: Mucous membranes are moist.  Eyes:     Extraocular Movements: Extraocular movements intact.  Cardiovascular:     Rate and Rhythm: Normal rate and regular rhythm.  Pulmonary:     Effort: Pulmonary effort is normal.     Breath sounds: Normal breath sounds.  Abdominal:     Palpations: Abdomen is soft.     Tenderness: There is no abdominal tenderness.  Musculoskeletal:        General: Normal range of motion.     Cervical  back: Normal range of motion and neck supple.     Right lower leg: Edema (2+ to ankles) present.     Left lower leg: Edema (2+ to ankles) present.  Skin:    General: Skin is warm and dry.     Comments: ~1.5 cm diameter wound to posterior L ankle, no surrounding erythema or warmth  Neurological:     General: No focal deficit present.     Mental Status: She is alert and oriented to person, place, and time.  Psychiatric:        Mood and Affect: Mood normal.        Behavior: Behavior normal.     ED Results / Procedures / Treatments   Labs (all labs ordered are listed, but only abnormal results are displayed) Labs Reviewed  RESP PANEL BY RT-PCR (RSV, FLU A&B, COVID)  RVPGX2 - Abnormal; Notable for the following components:      Result Value   Influenza A by PCR POSITIVE (*)    All other components within normal limits  COMPREHENSIVE METABOLIC PANEL - Abnormal; Notable for the following components:   BUN 5 (*)    Creatinine, Ser 1.84 (*)    Albumin 2.7 (*)    GFR, Estimated 29 (*)    All other components within normal limits  CBC - Abnormal; Notable for the following components:   WBC 2.0 (*)    Hemoglobin 11.0 (*)    Platelets 61 (*)    All other components within normal limits  BRAIN NATRIURETIC PEPTIDE - Abnormal; Notable for the following components:   B Natriuretic Peptide 413.1 (*)    All other components within normal limits  TROPONIN I (HIGH SENSITIVITY)  TROPONIN I (HIGH SENSITIVITY)    EKG EKG Interpretation Date/Time:  Saturday December 20 2023 13:20:16 EST Ventricular Rate:  61 PR Interval:  177 QRS Duration:  112 QT Interval:  465 QTC Calculation: 469 R Axis:   127  Text Interpretation: Sinus rhythm Low voltage, extremity leads Consider left ventricular hypertrophy Since last tracing of earlier today No significant change was found Confirmed by Elayne Snare ([id number]) on 12/20/2023 1:30:31 PM  Radiology DG Chest Port 1 View Result Date: 12/20/2023 CLINICAL  DATA:  Shortness of breath. EXAM: PORTABLE CHEST 1 VIEW COMPARISON:  December 13, 2023. FINDINGS: Stable cardiomediastinal silhouette. Minimal bibasilar subsegmental atelectasis or scarring is noted with small pleural effusions. Bony thorax is unremarkable. IMPRESSION: Minimal bibasilar subsegmental atelectasis or scarring is noted with small pleural effusions. Electronically Signed   By: Lupita Raider M.D.   On: 12/20/2023 13:12    Procedures Procedures    Medications Ordered in ED Medications - No data to display  ED Course/ Medical Decision Making/ A&P Clinical  Course as of 12/20/23 1541  Sat Dec 20, 2023  1539 Patients labs at baseline without acute abnormality, still flu A positive. Patient signed out to Dr. Wilkie Aye pending CTPE study. [VK]    Clinical Course User Index [VK] Rexford Maus, DO                                 Medical Decision Making This patient presents to the ED with chief complaint(s) of SOB with pertinent past medical history of ESRD, HTN, CHF, recent admission for Flu and pneumonia which further complicates the presenting complaint. The complaint involves an extensive differential diagnosis and also carries with it a high risk of complications and morbidity.    The differential diagnosis includes ACS, arrhythmia, anemia, pneumonia, pneumothorax, pulmonary edema, pleural effusion, considering PE with recent hospitalization, post flu syndrome, no wheezing on exam making bronchitis less likely  Additional history obtained: Additional history obtained from family Records reviewed previous admission documents  ED Course and Reassessment: On patient's arrival she is hemodynamically stable in no acute distress.  She was initially evaluated in triage and had EKG, labs and chest x-ray ordered.  EKG showed normal sinus rhythm without acute ischemic changes.  Labs are pending at this time.  Chest x-ray showed small pleural effusions.  Patient will additionally have CT  PE study with her recent hospitalization and will be closely reassessed.  Independent labs interpretation:  The following labs were independently interpreted: flu A positive, otherwise labs at baseline  Independent visualization of imaging: - I independently visualized the following imaging with scope of interpretation limited to determining acute life threatening conditions related to emergency care: CXR, which revealed small bilateral pleural effusions    Amount and/or Complexity of Data Reviewed Labs: ordered. Radiology: ordered.          Final Clinical Impression(s) / ED Diagnoses Final diagnoses:  None    Rx / DC Orders ED Discharge Orders     None         Rexford Maus, DO 12/20/23 1541

## 2023-12-22 ENCOUNTER — Encounter: Payer: Medicare (Managed Care) | Admitting: Family

## 2023-12-22 ENCOUNTER — Telehealth: Payer: Self-pay

## 2023-12-22 NOTE — Progress Notes (Unsigned)
 Cardiology Office Note:    Date:  12/23/2023   ID:  Kara Mullins, DOB 10-05-1951, MRN [id number]  PCP:  Caesar Bookman, NP  Cardiologist:  [name], MD  Electrophysiologist:  None   Referring MD: Caesar Bookman, NP   Chief Complaint  Patient presents with   Shortness of Breath     History of Present Illness:    Kara Mullins is a 73 y.o. female with a hx of chronic combined systolic  heart failure, hypertension, lymphedema, ESRD who presents for follow-up.  She was admitted to [facility] on 03/05/2021 with shortness of breath.  Echocardiogram showed LVEF 45%, severe LVH, concerning for cardiac amyloid.  Work-up for amyloidosis showed abnormal light chains, hematology was consulted underwent bone marrow biopsy.  Course is complicated by AKI thought to be secondary to contrast nephropathy.  Nephrology was consulted and she was diuresed with IV Lasix with improvement in her kidney function.  She was discharged on oral Lasix 40 mg daily.  Cardiac MRI on 04/30/2021 showed no evidence of cardiac amyloidosis, moderate asymmetric LVH not meeting HCM criteria, LVEF 53%, RVEF 57%, no LGE.  She was admitted in February 2023 with AKI.  Baseline creatinine 1.3-1.6, was up to 4.7.  Creatinine 4.3 on discharge.  She was evaluated by nephrology, stated not a good candidate for hemodialysis or aggressive intervention.  Felt that AKI was likely secondary to ATN but could not rule out plasma cell dyscrasia.  She was also found to have UTI and completed 7-day course of meropenem.  Her Lasix, losartan, and spironolactone were discontinued due to AKI.  She was discharged on as needed Lasix.  Palliative care was consulted and recommended outpatient follow-up.  She ultimately ended up starting on hemodialysis.  Echocardiogram 05/2023 showed EF 55%, normal RV function, mild mitral regurgitation.  Since last clinic visit, she was admitted 12/2023 with flu.  Presented last week to ED with shortness  of breath, found to have PE.  She started HD.  She has lost 70 pounds.  She denies any chest pain but reports dyspnea since recent flu/PE.  She denies any lower extremity edema or palpitations.  She started Eliquis, reports some nosebleeds but otherwise no bleeding.   Wt Readings from Last 3 Encounters:  12/23/23 106 lb (48.1 kg)  12/20/23 110 lb 0.2 oz (49.9 kg)  12/17/23 108 lb 14.5 oz (49.4 kg)      Past Medical History:  Diagnosis Date   Chronic combined systolic and diastolic congestive heart failure (HCC) 11/09/2021   Generalized anxiety disorder    Hyperlipidemia 11/09/2021   Hypertension    Stage 3b chronic kidney disease (CKD) (HCC) 11/09/2021    Past Surgical History:  Procedure Laterality Date   AV FISTULA PLACEMENT Left 03/27/2023   Procedure: LEFT ARM BRACHIOCEPHALIC ARTERIOVENOUS (AV) FISTULA CREATION;  Surgeon: [name], MD;  Location: [facility] OR;  Service: Vascular;  Laterality: Left;   CATARACT EXTRACTION, BILATERAL     combined systolic and diastolic congestive heart failure      INSERTION OF DIALYSIS CATHETER N/A 03/27/2023   Procedure: INSERTION OF RIGHT INTERNAL JUGULAR TUNNELED DIALYSIS CATHETER;  Surgeon: [name], MD;  Location: [facility] OR;  Service: Vascular;  Laterality: N/A;   IR THORACENTESIS ASP PLEURAL SPACE W/IMG GUIDE  06/12/2023    Current Medications: Current Meds  Medication Sig   APIXABAN (ELIQUIS) VTE STARTER PACK (10MG  AND 5MG ) Take as directed on package: start with two-5mg  tablets twice daily for 7 days.  On day 8, switch to one-5mg  tablet twice daily.   carvedilol (COREG) 3.125 MG tablet Take 3.125 mg by mouth 2 (two) times daily.   fluticasone (FLONASE) 50 MCG/ACT nasal spray Place 2 sprays into both nostrils daily.   furosemide (LASIX) 80 MG tablet Take 1 tablet (80 mg total) by mouth every Monday, Wednesday, Friday and Sunday.   lidocaine-prilocaine (EMLA) cream APPLY 1 APPLICATION TOPICALLY AS NEEDED.(USE 30 MINUTES PRIOR TO  DIALYSIS). (Patient taking differently: Apply 1 Application topically as needed (for port access).)   Menthol-Methyl Salicylate (MUSCLE RUB) 10-15 % CREA Apply 1 Application topically daily as needed for muscle pain.   multivitamin (RENA-VIT) TABS tablet Take 1 tablet by mouth daily. (Patient taking differently: Take 1 tablet by mouth See admin instructions. Take 1 tablet by mouth every Mon, Wed, Fri, Sat)   OVER THE COUNTER MEDICATION Take 1 fluid ounce by mouth daily. Liquid Cell Protein   sevelamer carbonate (RENVELA) 800 MG tablet Take 1 tablet (800 mg total) by mouth 3 (three) times daily with meals.   silver sulfADIAZINE (SILVADENE) 1 % cream Apply 1 Application topically daily.   [DISCONTINUED] isosorbide mononitrate (IMDUR) 30 MG 24 hr tablet Take 1 tablet (30 mg total) by mouth daily. (Patient taking differently: Take 30 mg by mouth See admin instructions. Take 1 tablet by mouth every Mon, Wed, Fri, Sat)     Allergies:   Crestor [rosuvastatin] and Nitrofuran derivatives   Social History   Socioeconomic History   Marital status: Divorced    Spouse name: Not on file   Number of children: 1   Years of education: Not on file   Highest education level: Not on file  Occupational History   Occupation: retired  Tobacco Use   Smoking status: Never   Smokeless tobacco: Never  Vaping Use   Vaping status: Never Used  Substance and Sexual Activity   Alcohol use: Not Currently   Drug use: Never   Sexual activity: Not Currently    Partners: Male    Comment: Divorced  Other Topics Concern   Not on file  Social History Narrative   ** Merged History Encounter **       Tobacco use, amount per day now: N/A Past tobacco use, amount per day: N/A How many years did you use tobacco: N/A Alcohol use (drinks per week): N/A Diet: Do you drink/eat things with caffeine: Coffee Marital status: Divorced                                    What year were you married? 1992 Do you live in a house,  apartment, assisted living, condo, trailer, etc.? Is it one or more stories? How many persons live in your home? 2 Do you have pets in your home?( please list) No Highest Level of e   ducation completed? High School Current or past profession: Restaurant Employee Do you exercise?   Yes                               Type and how often? Walk Do you have a living will? No Do you have a DNR form?    No                               If  not, do you want to discuss one? Do you have signed POA/HPOA forms? No                       If so, please bring to you appointment  Do you have any difficulty bathing or dressing yourself? No Do you have any difficulty preparing food or eating? No    Do you have any difficulty managing your medications? No Do you have any difficulty managing your finances? No Do you have any difficulty affording your medications? No   Social Drivers of Corporate investment banker Strain: Low Risk  (06/11/2022)   Overall Financial Resource Strain (CARDIA)    Difficulty of Paying Living Expenses: Not very hard  Food Insecurity: No Food Insecurity (12/13/2023)   Hunger Vital Sign    Worried About Running Out of Food in the Last Year: Never true    Ran Out of Food in the Last Year: Never true  Transportation Needs: No Transportation Needs (12/13/2023)   PRAPARE - Administrator, Civil Service (Medical): No    Lack of Transportation (Non-Medical): No  Physical Activity: Not on file  Stress: Not on file  Social Connections: Moderately Isolated (12/13/2023)   Social Connection and Isolation Panel [NHANES]    Frequency of Communication with Friends and Family: More than three times a week    Frequency of Social Gatherings with Friends and Family: More than three times a week    Attends Religious Services: Never    Database administrator or Organizations: No    Attends Engineer, structural: 1 to 4 times per year    Marital Status: Divorced     Family  History: The patient's family history includes Throat cancer in her father.  ROS:   Please see the history of present illness.    (+) LE edema (+) shortness of breath  All other systems reviewed and are negative.  EKGs/Labs/Other Studies Reviewed:    The following studies were reviewed today: Cardiac morphology 07/22:  IMPRESSION:  1.  No evidence of cardiac amyloidosis   2. Asymmetric LV hypertrophy measuring up to 14mm in basal septum (7mm in posterior wall), not meeting criteria for hypertrophic cardiomyopathy (<41mm)   3.  Mild LV dilatation with low normal systolic function (EF 53%)   4.  Normal RV size and systolic function (EF 57%)   5.  No late gadolinium enhancement to suggest myocardial scar  Echo 03/05/2021: 1. Left ventricular ejection fraction, by estimation, is 45%. The left  ventricle has mildly decreased function. The left ventricle demonstrates  global hypokinesis. There is severe left ventricular hypertrophy. Left  ventricular diastolic parameters are  consistent with Grade I diastolic dysfunction (impaired relaxation). Would  consider cardiac amyloidosis.   2. Right ventricular systolic function is normal. The right ventricular  size is normal. There is mildly elevated pulmonary artery systolic  pressure. The estimated right ventricular systolic pressure is 41.4 mmHg.   3. Left atrial size was severely dilated.   4. The mitral valve is normal in structure. Mild to moderate mitral valve  regurgitation. No evidence of mitral stenosis.   5. The aortic valve is tricuspid. Aortic valve regurgitation is trivial.  No aortic stenosis is present.   6. Aortic dilatation noted. There is mild dilatation of the aortic root,  measuring 37 mm.   7. The inferior vena cava is dilated in size with <50% respiratory  variability, suggesting right atrial pressure of  15 mmHg.   8. There is a left pleural effusion and trivial pericardial effusion.   [place] LE Venous Bilateral  03/05/2021: BILATERAL:  - No evidence of deep vein thrombosis seen in the lower extremities,  bilaterally.  - No evidence of superficial venous thrombosis in the lower extremities,  bilaterally.  -No evidence of popliteal cyst, bilaterally.  -Significant subcutaneous edema found throughout lower extremities.  CT Angio Chest 03/05/2021: IMPRESSION: 1. No pulmonary embolus. 2. Pulmonary edema with trace to small volume left and small to moderate volume right pleural effusions. 3.  Aortic Atherosclerosis (ICD10-I70.0).  EKG:   12/19/21: Sinus bradycardia, rate 54, left axis deviation, poor R wave progression 07/22:no EKG was ordered today. 03/19/2021: NSR, rate 57, nonspecific T wave flattening  Recent Labs: 03/24/2023: TSH 2.488 03/25/2023: Magnesium 2.6 12/20/2023: ALT 18; B Natriuretic Peptide 413.1; BUN 5; Creatinine, Ser 1.84; Hemoglobin 11.0; Platelets 61; Potassium 3.5; Sodium 137  Recent Lipid Panel    Component Value Date/Time   CHOL 179 04/21/2023 0945   TRIG 45 04/21/2023 0945   HDL 79 04/21/2023 0945   CHOLHDL 2.3 04/21/2023 0945   CHOLHDL 3.2 08/27/2021 0956   LDLCALC 91 04/21/2023 0945   LDLCALC 128 (H) 08/27/2021 0956   LDLDIRECT 99 04/21/2023 0945    Physical Exam:    VS:  BP 107/70   Pulse 60   Ht 5\' 5"  (1.651 m)   Wt 106 lb (48.1 kg)   SpO2 97%   BMI 17.64 kg/m     Wt Readings from Last 3 Encounters:  12/23/23 106 lb (48.1 kg)  12/20/23 110 lb 0.2 oz (49.9 kg)  12/17/23 108 lb 14.5 oz (49.4 kg)     GEN: Well nourished, well developed in no acute distress HEENT: Normal NECK: No JVD; No carotid bruits LYMPHATICS: No lymphadenopathy CARDIAC: RRR, no murmurs, rubs, gallops RESPIRATORY:  Clear to auscultation without rales, wheezing or rhonchi  ABDOMEN: Soft, non-tender, non-distended MUSCULOSKELETAL: Trace pitting edema; has lymphedema SKIN: Warm and dry NEUROLOGIC:  Alert and oriented x 3 PSYCHIATRIC:  Normal affect   ASSESSMENT:    1. Heart  failure with improved ejection fraction (HFimpEF) (HCC)   2. Acute pulmonary embolism, unspecified pulmonary embolism type, unspecified whether acute cor pulmonale present (HCC)   3. Essential hypertension      PLAN:    Heart failure with recovered EF: Diagnosed during admission 02/2021, with EF 45% and severe LVH on echo but relatively low voltage on EKG, concerning for cardiac amyloidosis.  Amyloid work-up showed abnormal light chains and underwent bone marrow biopsy with hematology.  Cardiac MRI on 04/30/2021 showed no evidence of cardiac amyloidosis, moderate asymmetric LVH not meeting HCM criteria, LVEF 53%, RVEF 57%, no LGE.  Echocardiogram 05/2023 showed EF 55%, normal RV function, mild mitral regurgitation. -Continue carvedilol 3.125 mg twice daily -Taking Imdur 30 mg daily, will decrease dose to 15 mg daily -Losartan and spironolactone were discontinued due to AKI. -Takes Lasix 80 mg on nondialysis days  PE: CTPA 12/20/2023 with isolated subsegmental right upper lobe PE.  Started on Eliquis.  Check echocardiogram to evaluate for right heart strain  ESRD: On TTS HD   Hypertension: On carvedilol 3.125 mg twice daily and Imdur 30 mg daily.  Soft BP in clinic today, will reduce Imdur to 15 mg daily  Hyperlipidemia: LDL 125 on 04/30/2021.  Previously started rosuvastatin but did not tolerate, was discontinued  RTC in 6 months   Medication Adjustments/Labs and Tests Ordered: Current medicines are reviewed at  length with the patient today.  Concerns regarding medicines are outlined above.  Orders Placed This Encounter  Procedures   ECHOCARDIOGRAM COMPLETE   Meds ordered this encounter  Medications   isosorbide mononitrate (IMDUR) 30 MG 24 hr tablet    Sig: Take 0.5 tablets (15 mg total) by mouth daily.    Dispense:  90 tablet    Refill:  1    Patient Instructions  Medication Instructions:  Decrease: Isosorbide Mononitrate (Imdur) down to 15 mg once per day  Lab  Work: None  Testing/Procedures: Your physician has requested that you have an echocardiogram. Echocardiography is a painless test that uses sound waves to create images of your heart. It provides your doctor with information about the size and shape of your heart and how well your heart's chambers and valves are working. This procedure takes approximately one hour. There are no restrictions for this procedure. Please do NOT wear cologne, perfume, aftershave, or lotions (deodorant is allowed). Please arrive 15 minutes prior to your appointment time. This will take place at [address] [place]   Follow-Up: At [facility], you and your health needs are our priority.  As part of our continuing mission to provide you with exceptional heart care, we have created designated Provider Care Teams.  These Care Teams include your primary Cardiologist (physician) and Advanced Practice Providers (APPs -  Physician Assistants and Nurse Practitioners) who all work together to provide you with the care you need, when you need it.  Your next appointment:   6 month(s) *Call in May for September 2025 appointment  Provider:   [name], MD              Signed, [name], MD  12/23/2023 11:24 PM    [facility]

## 2023-12-22 NOTE — Transitions of Care (Post Inpatient/ED Visit) (Signed)
   12/22/2023  Name: Kara Mullins MRN: [id number] DOB: 1951/09/25  Today's TOC FU Call Status: Today's TOC FU Call Status:: Unsuccessful Call (3rd Attempt) Unsuccessful Call (3rd Attempt) Date: 12/22/23 Patient's Name and Date of Birth confirmed.  Transition Care Management Follow-up Telephone Call Discharge Facility: [facility]   [facility] RN spoke with daughter, Delice Bison who states patient goes to dialysis 3x/week, has cardiology appointment tomorrow and denied need for [facility] program. Daughter states she has [facility] RN contact information and will call if questions arise.   Hilbert Odor RN, CCM [place]  VBCI-Population Health RN Care Manager [phone]

## 2023-12-23 ENCOUNTER — Encounter: Payer: Self-pay | Admitting: Cardiology

## 2023-12-23 ENCOUNTER — Ambulatory Visit: Payer: Medicare (Managed Care) | Attending: Cardiology | Admitting: Cardiology

## 2023-12-23 VITALS — BP 107/70 | HR 60 | Ht 65.0 in | Wt 106.0 lb

## 2023-12-23 DIAGNOSIS — I5032 Chronic diastolic (congestive) heart failure: Secondary | ICD-10-CM | POA: Diagnosis not present

## 2023-12-23 DIAGNOSIS — I2699 Other pulmonary embolism without acute cor pulmonale: Secondary | ICD-10-CM | POA: Diagnosis not present

## 2023-12-23 DIAGNOSIS — I10 Essential (primary) hypertension: Secondary | ICD-10-CM

## 2023-12-23 MED ORDER — ISOSORBIDE MONONITRATE ER 30 MG PO TB24: 15.0000 mg | ORAL_TABLET | Freq: Every day | ORAL | 1 refills | Status: DC

## 2023-12-23 NOTE — Patient Instructions (Signed)
 Medication Instructions:  Decrease: Isosorbide Mononitrate (Imdur) down to 15 mg once per day  Lab Work: None  Testing/Procedures: Your physician has requested that you have an echocardiogram. Echocardiography is a painless test that uses sound waves to create images of your heart. It provides your doctor with information about the size and shape of your heart and how well your heart's chambers and valves are working. This procedure takes approximately one hour. There are no restrictions for this procedure. Please do NOT wear cologne, perfume, aftershave, or lotions (deodorant is allowed). Please arrive 15 minutes prior to your appointment time. This will take place at [address] [place]   Follow-Up: At [facility], you and your health needs are our priority.  As part of our continuing mission to provide you with exceptional heart care, we have created designated Provider Care Teams.  These Care Teams include your primary Cardiologist (physician) and Advanced Practice Providers (APPs -  Physician Assistants and Nurse Practitioners) who all work together to provide you with the care you need, when you need it.  Your next appointment:   6 month(s) *Call in May for September 2025 appointment  Provider:   [name], MD

## 2023-12-28 ENCOUNTER — Other Ambulatory Visit: Payer: Self-pay | Admitting: Family

## 2023-12-28 DIAGNOSIS — N186 End stage renal disease: Secondary | ICD-10-CM

## 2023-12-30 ENCOUNTER — Encounter: Payer: Self-pay | Admitting: Family

## 2023-12-30 ENCOUNTER — Ambulatory Visit (INDEPENDENT_AMBULATORY_CARE_PROVIDER_SITE_OTHER): Payer: Medicare (Managed Care) | Admitting: Family

## 2023-12-30 VITALS — BP 116/78 | HR 60 | Temp 96.6°F | Resp 18 | Ht 65.0 in | Wt 106.0 lb

## 2023-12-30 DIAGNOSIS — I10 Essential (primary) hypertension: Secondary | ICD-10-CM | POA: Diagnosis not present

## 2023-12-30 DIAGNOSIS — Z992 Dependence on renal dialysis: Secondary | ICD-10-CM | POA: Diagnosis not present

## 2023-12-30 DIAGNOSIS — E43 Unspecified severe protein-calorie malnutrition: Secondary | ICD-10-CM | POA: Diagnosis not present

## 2023-12-30 DIAGNOSIS — L89622 Pressure ulcer of left heel, stage 2: Secondary | ICD-10-CM

## 2023-12-30 DIAGNOSIS — Z86711 Personal history of pulmonary embolism: Secondary | ICD-10-CM | POA: Diagnosis not present

## 2023-12-30 DIAGNOSIS — J90 Pleural effusion, not elsewhere classified: Secondary | ICD-10-CM

## 2023-12-30 DIAGNOSIS — N186 End stage renal disease: Secondary | ICD-10-CM

## 2023-12-30 DIAGNOSIS — I5042 Chronic combined systolic (congestive) and diastolic (congestive) heart failure: Secondary | ICD-10-CM

## 2023-12-30 DIAGNOSIS — R2681 Unsteadiness on feet: Secondary | ICD-10-CM | POA: Diagnosis not present

## 2023-12-30 NOTE — Patient Instructions (Signed)
-   HH Nurse for wound care,cleanse right heel with saline,pat dry ,apply calcium alginate and cover with foam dressing for protection and absorption.change dressing every 3 days and as needed if soiled.  -- Please get abdominal Chest CT scan in one month at [facility] imaging at [facility] then will call you with results.

## 2024-01-02 ENCOUNTER — Telehealth: Payer: Self-pay

## 2024-01-02 NOTE — Telephone Encounter (Signed)
Message routed to PCP [name], [name], NP  

## 2024-01-02 NOTE — Telephone Encounter (Signed)
 Noted.

## 2024-01-02 NOTE — Telephone Encounter (Signed)
 Copied from CRM [id number]. Topic: General - Other >> Jan 02, 2024  9:50 AM Corin V wrote: Reason for CRM: Rosa with [facility] called to let PCP know that patient requested they come to the home tomorrow rather than today. If there are any questions, please call back at [phone]

## 2024-01-03 DIAGNOSIS — Z556 Problems related to health literacy: Secondary | ICD-10-CM | POA: Diagnosis not present

## 2024-01-03 DIAGNOSIS — I132 Hypertensive heart and chronic kidney disease with heart failure and with stage 5 chronic kidney disease, or end stage renal disease: Secondary | ICD-10-CM | POA: Diagnosis not present

## 2024-01-03 DIAGNOSIS — E02 Subclinical iodine-deficiency hypothyroidism: Secondary | ICD-10-CM | POA: Diagnosis not present

## 2024-01-03 DIAGNOSIS — R634 Abnormal weight loss: Secondary | ICD-10-CM | POA: Diagnosis not present

## 2024-01-03 DIAGNOSIS — Z681 Body mass index (BMI) 19 or less, adult: Secondary | ICD-10-CM | POA: Diagnosis not present

## 2024-01-03 DIAGNOSIS — I890 Lymphedema, not elsewhere classified: Secondary | ICD-10-CM | POA: Diagnosis not present

## 2024-01-03 DIAGNOSIS — J111 Influenza due to unidentified influenza virus with other respiratory manifestations: Secondary | ICD-10-CM | POA: Diagnosis not present

## 2024-01-03 DIAGNOSIS — N390 Urinary tract infection, site not specified: Secondary | ICD-10-CM | POA: Diagnosis not present

## 2024-01-03 DIAGNOSIS — Z7901 Long term (current) use of anticoagulants: Secondary | ICD-10-CM | POA: Diagnosis not present

## 2024-01-03 DIAGNOSIS — Z8616 Personal history of COVID-19: Secondary | ICD-10-CM | POA: Diagnosis not present

## 2024-01-03 DIAGNOSIS — E785 Hyperlipidemia, unspecified: Secondary | ICD-10-CM | POA: Diagnosis not present

## 2024-01-03 DIAGNOSIS — F411 Generalized anxiety disorder: Secondary | ICD-10-CM | POA: Diagnosis not present

## 2024-01-03 DIAGNOSIS — D631 Anemia in chronic kidney disease: Secondary | ICD-10-CM | POA: Diagnosis not present

## 2024-01-05 ENCOUNTER — Encounter: Payer: Self-pay | Admitting: Podiatry

## 2024-01-06 ENCOUNTER — Ambulatory Visit: Payer: Medicare (Managed Care) | Admitting: Podiatry

## 2024-01-07 ENCOUNTER — Ambulatory Visit: Payer: Medicare (Managed Care) | Attending: Family

## 2024-01-07 ENCOUNTER — Other Ambulatory Visit: Payer: Self-pay

## 2024-01-07 DIAGNOSIS — M6281 Muscle weakness (generalized): Secondary | ICD-10-CM

## 2024-01-07 DIAGNOSIS — R293 Abnormal posture: Secondary | ICD-10-CM

## 2024-01-07 DIAGNOSIS — R2681 Unsteadiness on feet: Secondary | ICD-10-CM | POA: Insufficient documentation

## 2024-01-07 DIAGNOSIS — R252 Cramp and spasm: Secondary | ICD-10-CM | POA: Diagnosis not present

## 2024-01-07 DIAGNOSIS — R262 Difficulty in walking, not elsewhere classified: Secondary | ICD-10-CM | POA: Diagnosis not present

## 2024-01-07 NOTE — Therapy (Signed)
 OUTPATIENT PHYSICAL THERAPY LOWER EXTREMITY EVALUATION   Patient Name: Kara Mullins MRN: [id number] DOB:May 21, 1951, 73 y.o., female Today's Date: 01/07/2024  END OF SESSION:  PT End of Session - 01/07/24 1627     Visit Number 1    Date for PT Re-Evaluation 03/03/24    Authorization Type [employer]    PT Start Time 1615    PT Stop Time 1650    PT Time Calculation (min) 35 min    Equipment Utilized During Treatment Gait belt;Other (comment)   Wheelchair   Activity Tolerance Patient limited by fatigue;Patient tolerated treatment well    Behavior During Therapy Flat affect             Past Medical History:  Diagnosis Date   Chronic combined systolic and diastolic congestive heart failure (HCC) 11/09/2021   Generalized anxiety disorder    Hyperlipidemia 11/09/2021   Hypertension    Stage 3b chronic kidney disease (CKD) (HCC) 11/09/2021   Past Surgical History:  Procedure Laterality Date   AV FISTULA PLACEMENT Left 03/27/2023   Procedure: LEFT ARM BRACHIOCEPHALIC ARTERIOVENOUS (AV) FISTULA CREATION;  Surgeon: [name], MD;  Location: [facility];  Service: Vascular;  Laterality: Left;   CATARACT EXTRACTION, BILATERAL     combined systolic and diastolic congestive heart failure      INSERTION OF DIALYSIS CATHETER N/A 03/27/2023   Procedure: INSERTION OF RIGHT INTERNAL JUGULAR TUNNELED DIALYSIS CATHETER;  Surgeon: [name], MD;  Location: [facility] OR;  Service: Vascular;  Laterality: N/A;   IR THORACENTESIS ASP PLEURAL SPACE W/IMG GUIDE  06/12/2023   Patient Active Problem List   Diagnosis Date Noted   Acute on chronic respiratory failure with hypoxia (HCC) 12/13/2023   Pleural effusion 12/13/2023   Heel ulcer (HCC) 12/13/2023   Influenza A 12/13/2023   PVD (peripheral vascular disease) (HCC) 12/13/2023   Thrombocytopenia (HCC) 12/13/2023   ESRD on hemodialysis (HCC) 06/11/2023   Chronic idiopathic thrombocytopenia (HCC) 06/11/2023   Acute on chronic  diastolic (congestive) heart failure (HCC) 03/22/2023   Anemia of chronic disease 03/22/2023   Sacral wound 03/22/2023   Acute on chronic diastolic CHF (congestive heart failure) (HCC) 02/26/2023   Cardiorenal syndrome 02/25/2023   Chronic kidney disease (CKD), stage IV (severe) (HCC) 02/25/2023   Hypoglycemia 08/15/2022   Hypertensive emergency 08/15/2022   Obesity (BMI 30-39.9) 08/15/2022   Acute on chronic diastolic heart failure (HCC) 08/14/2022   AKI (acute kidney injury) (HCC)    (HFpEF) heart failure with preserved ejection fraction (HCC) 06/02/2022   Acute respiratory distress 06/02/2022   Bilateral lower extremity edema 06/02/2022   Hypocalcemia 06/02/2022   Subclinical hypothyroidism 06/02/2022   Transaminitis 06/02/2022   Acute kidney injury superimposed on chronic kidney disease (HCC) 03/28/2022   Anasarca associated with disorder of kidney 03/27/2022   Leukopenia 03/27/2022   Aortic atherosclerosis (HCC) 03/27/2022   Pressure injury of skin 01/02/2022   Hypotension 12/30/2021   Pneumonia due to COVID-19 virus 12/26/2021   Palliative care by specialist    Goals of care, counseling/discussion    General weakness    Sepsis (HCC) 12/22/2021   COVID-19 virus infection 12/22/2021   Paroxysmal atrial fibrillation (HCC) 12/22/2021   CKD (chronic kidney disease) stage 5, GFR less than 15 ml/min (HCC) 12/22/2021   Chronic anemia 12/22/2021   Prolonged QT interval 12/22/2021   Debility 11/20/2021   Metabolic acidosis 11/20/2021   Severe sepsis (HCC) 11/19/2021   Hypothermia 11/09/2021   Hyperlipidemia 11/09/2021   Chronic combined systolic  and diastolic congestive heart failure (HCC) 11/09/2021   Pancytopenia (HCC) 11/09/2021   Severe protein-calorie malnutrition (HCC) 11/09/2021   Acute combined systolic and diastolic HF (heart failure) (HCC)    Acute exacerbation of CHF (congestive heart failure) (HCC) 03/06/2021   Essential hypertension    Hypertensive urgency  03/05/2021   Acute CHF (congestive heart failure) (HCC) 03/05/2021   Nausea and vomiting 03/05/2021   Elevated troponin level not due myocardial infarction 03/05/2021   Elevated d-dimer 03/05/2021   Acute congestive heart failure (HCC) 03/05/2021    PCP: [name], [name], NP  REFERRING PROVIDER: [name], [name], NP  REFERRING DIAG: R26.81 (ICD-10-CM) - Unsteady gait  THERAPY DIAG:  Difficulty in walking, not elsewhere classified - Plan: PT plan of care cert/re-cert  Muscle weakness (generalized) - Plan: PT plan of care cert/re-cert  Cramp and spasm - Plan: PT plan of care cert/re-cert  Abnormal posture - Plan: PT plan of care cert/re-cert  Rationale for Evaluation and Treatment: Rehabilitation  ONSET DATE: 12/30/2023  SUBJECTIVE:   SUBJECTIVE STATEMENT: Patient arrives with caregiver.  She is in wheelchair and cg states that she has not walked in over a year but this is the goal.  Or at least to be able to stand and do transfers with increased ease.  She just started dialysis and has very low HR.  CG hopes for patient to gain her strength back and be able to stand and walk at least a few feet to avoid decline functionally and frequent rehospitalization.  Patient is minimally talkative/ flat affect.  She lives at home with family.    PERTINENT HISTORY: Over the last 2 years, bout with Covid, in/out hospital for low oxygen and low HR, recent flu. PAIN:  Are you having pain?  Patient states no pain but caregiver said she  always c/o her knees in the past  PRECAUTIONS: Fall  RED FLAGS: None   WEIGHT BEARING RESTRICTIONS: No  FALLS:  Has patient fallen in last 6 months? No  LIVING ENVIRONMENT: Lives with: lives with their family Lives in: House/apartment Stairs: No Has following equipment at home: Single point cane, Environmental consultant - 2 wheeled, Environmental consultant - 4 wheeled, Wheelchair (manual), shower chair, Shower bench, bed side commode, Grab bars, and Ramped entry  OCCUPATION:  na  PLOF: Independent with household mobility with device, Independent with community mobility with device, Needs assistance with ADLs, Needs assistance with homemaking, Needs assistance with gait, and Needs assistance with transfers  PATIENT GOALS: CG hopes for patient to gain her strength back and be able to stand and walk at least a few feet to avoid decline functionally and frequent rehospitalization.  NEXT MD VISIT: prn  OBJECTIVE:  Note: Objective measures were completed at Evaluation unless otherwise noted.  DIAGNOSTIC FINDINGS: na  PATIENT SURVEYS:  Patient unable to fill out  COGNITION: Overall cognitive status: Impaired     SENSATION: WFL   MUSCLE LENGTH: Contractures bilateral knee flexion and hip flexion  POSTURE: rounded shoulders, forward head, and increased lumbar lordosis  PALPATION: na  LOWER EXTREMITY ROM:  Lacking approx 30-40 degrees of knee extension,  unable to flex knees beyond 80-90 degrees, minimal ankle mobility due to severe edema and having not been ambulatory for over one year.  LOWER EXTREMITY MMT:  Generally 3/5 bilateral LE's due to ROM limitations  L FUNCTIONAL TESTS:  Unable to complete any functional tests.  Patient is max assist to come to stand  GAIT: Distance walked: 0 Assistive device utilized: Environmental consultant -  2 wheeled Level of assistance:  unable to assist Comments: non ambulatory                                                                                                                                TREATMENT DATE: 01/07/24 Initial eval completed and initiated HEP    PATIENT EDUCATION:  Education details: Initiated HEP, educated on prognosis of walking based on patients objective presentation and has not walked in over 1 year.  Person educated: Patient and Child(ren) Education method: Explanation, Demonstration, and Verbal cues Education comprehension: verbalized understanding  HOME EXERCISE PROGRAM: Instructed to do  ankle pumps and heel slides  ASSESSMENT:  CLINICAL IMPRESSION: Patient is a 73 y.o. female who was seen today for physical therapy evaluation and treatment for debility, leg weakness.   OBJECTIVE IMPAIRMENTS: cardiopulmonary status limiting activity, decreased balance, decreased cognition, decreased endurance, decreased knowledge of condition, decreased mobility, difficulty walking, decreased ROM, decreased strength, increased edema, increased fascial restrictions, increased muscle spasms, impaired flexibility, postural dysfunction, and pain.   ACTIVITY LIMITATIONS: carrying, lifting, bending, standing, squatting, sleeping, stairs, transfers, bed mobility, continence, bathing, toileting, dressing, reach over head, hygiene/grooming, and caring for others  PARTICIPATION LIMITATIONS: meal prep, cleaning, laundry, medication management, personal finances, driving, shopping, community activity, yard work, and church  PERSONAL FACTORS: Age, Education, Fitness, Time since onset of injury/illness/exacerbation, and 3+ comorbidities: multiple: see above  are also affecting patient's functional outcome.   REHAB POTENTIAL: Poor due to contractures and co-morbidities  CLINICAL DECISION MAKING: Unstable/unpredictable  EVALUATION COMPLEXITY: High   GOALS: Goals reviewed with patient? Yes  SHORT TERM GOALS: Target date: 02/04/2024  Patient to demonstrate ability to come to edge of chair independently Baseline: Goal status: INITIAL  2.  Patient will be independent with initial HEP  Baseline:  Goal status: INITIAL   LONG TERM GOALS: Target date: 03/03/2024   Patient to be independent with advanced HEP  Baseline:  Goal status: INITIAL  2.  Patient to be able to flex trunk fwd far enough to perform sit to stand with min assist Baseline:  Goal status: INITIAL  3.  Patient to be able to ambulate 10 steps with min assist Baseline:  Goal status: INITIAL  4.  Patient to be able to ambulate  10-15 feet with rw with min assist Baseline:  Goal status: INITIAL  5.  Knee extension to improve to -20 or better Baseline:  Goal status: INITIAL  6.  Patient to be able to transfer sit to stand independently Baseline:  Goal status: INITIAL   PLAN:  PT FREQUENCY: 1-2x/week  PT DURATION: 8 weeks  PLANNED INTERVENTIONS: [id number]-Therapeutic exercises, 97530- Therapeutic activity, [id number]- Neuromuscular re-education, 97535- Self Care, [id number]- Manual therapy, [id number]- Gait training, [phone]- Canalith repositioning, [id number]- Aquatic Therapy, [phone]- Electrical stimulation (unattended), [id number]- Electrical stimulation (manual), [id number]- Vasopneumatic device, [id number]- Ultrasound, [id number]- Traction (mechanical), [id number]- Ionotophoresis 4mg /ml Dexamethasone, Patient/Family education, Balance training, Stair training, Taping, Dry Needling, Joint mobilization, Spinal  mobilization, Vestibular training, Visual/preceptual remediation/compensation, DME instructions, Wheelchair mobility training, Cryotherapy, and Moist heat  PLAN FOR NEXT SESSION: Work on sit to stand, transfers, LE Chief Financial Officer B. Pearson Picou, PT 01/07/24 9:33 PM [facility] [address] [place], [place] [address] Phone # [phone] Fax [phone]

## 2024-01-11 ENCOUNTER — Inpatient Hospital Stay (HOSPITAL_COMMUNITY)
Admission: EM | Admit: 2024-01-11 | Discharge: 2024-01-18 | DRG: 177 | Disposition: A | Discharge status: Home / Self Care | Payer: Medicare (Managed Care) | Attending: Internal Medicine | Admitting: Internal Medicine

## 2024-01-11 ENCOUNTER — Encounter (HOSPITAL_COMMUNITY): Payer: Self-pay | Admitting: *Deleted

## 2024-01-11 ENCOUNTER — Emergency Department (HOSPITAL_COMMUNITY): Payer: Medicare (Managed Care)

## 2024-01-11 ENCOUNTER — Other Ambulatory Visit: Payer: Self-pay

## 2024-01-11 DIAGNOSIS — L89322 Pressure ulcer of left buttock, stage 2: Secondary | ICD-10-CM | POA: Diagnosis present

## 2024-01-11 DIAGNOSIS — Z992 Dependence on renal dialysis: Secondary | ICD-10-CM

## 2024-01-11 DIAGNOSIS — I10 Essential (primary) hypertension: Secondary | ICD-10-CM | POA: Diagnosis present

## 2024-01-11 DIAGNOSIS — R571 Hypovolemic shock: Secondary | ICD-10-CM | POA: Diagnosis not present

## 2024-01-11 DIAGNOSIS — L89312 Pressure ulcer of right buttock, stage 2: Secondary | ICD-10-CM | POA: Diagnosis present

## 2024-01-11 DIAGNOSIS — R627 Adult failure to thrive: Secondary | ICD-10-CM | POA: Diagnosis not present

## 2024-01-11 DIAGNOSIS — Z515 Encounter for palliative care: Secondary | ICD-10-CM

## 2024-01-11 DIAGNOSIS — M898X9 Other specified disorders of bone, unspecified site: Secondary | ICD-10-CM | POA: Diagnosis present

## 2024-01-11 DIAGNOSIS — N250 Renal osteodystrophy: Secondary | ICD-10-CM | POA: Diagnosis not present

## 2024-01-11 DIAGNOSIS — I132 Hypertensive heart and chronic kidney disease with heart failure and with stage 5 chronic kidney disease, or end stage renal disease: Secondary | ICD-10-CM | POA: Diagnosis present

## 2024-01-11 DIAGNOSIS — J90 Pleural effusion, not elsewhere classified: Secondary | ICD-10-CM | POA: Diagnosis not present

## 2024-01-11 DIAGNOSIS — Z808 Family history of malignant neoplasm of other organs or systems: Secondary | ICD-10-CM

## 2024-01-11 DIAGNOSIS — E162 Hypoglycemia, unspecified: Secondary | ICD-10-CM | POA: Diagnosis not present

## 2024-01-11 DIAGNOSIS — D72819 Decreased white blood cell count, unspecified: Secondary | ICD-10-CM | POA: Diagnosis present

## 2024-01-11 DIAGNOSIS — N186 End stage renal disease: Secondary | ICD-10-CM | POA: Diagnosis not present

## 2024-01-11 DIAGNOSIS — Z23 Encounter for immunization: Secondary | ICD-10-CM | POA: Diagnosis present

## 2024-01-11 DIAGNOSIS — D631 Anemia in chronic kidney disease: Secondary | ICD-10-CM | POA: Diagnosis present

## 2024-01-11 DIAGNOSIS — I5042 Chronic combined systolic (congestive) and diastolic (congestive) heart failure: Secondary | ICD-10-CM | POA: Diagnosis present

## 2024-01-11 DIAGNOSIS — I270 Primary pulmonary hypertension: Secondary | ICD-10-CM | POA: Diagnosis not present

## 2024-01-11 DIAGNOSIS — E785 Hyperlipidemia, unspecified: Secondary | ICD-10-CM | POA: Diagnosis present

## 2024-01-11 DIAGNOSIS — Z87891 Personal history of nicotine dependence: Secondary | ICD-10-CM

## 2024-01-11 DIAGNOSIS — D696 Thrombocytopenia, unspecified: Secondary | ICD-10-CM | POA: Diagnosis present

## 2024-01-11 DIAGNOSIS — I129 Hypertensive chronic kidney disease with stage 1 through stage 4 chronic kidney disease, or unspecified chronic kidney disease: Secondary | ICD-10-CM | POA: Diagnosis not present

## 2024-01-11 DIAGNOSIS — L89152 Pressure ulcer of sacral region, stage 2: Secondary | ICD-10-CM | POA: Diagnosis present

## 2024-01-11 DIAGNOSIS — L97409 Non-pressure chronic ulcer of unspecified heel and midfoot with unspecified severity: Secondary | ICD-10-CM | POA: Diagnosis present

## 2024-01-11 DIAGNOSIS — L89620 Pressure ulcer of left heel, unstageable: Secondary | ICD-10-CM | POA: Diagnosis present

## 2024-01-11 DIAGNOSIS — R0602 Shortness of breath: Secondary | ICD-10-CM | POA: Diagnosis not present

## 2024-01-11 DIAGNOSIS — E43 Unspecified severe protein-calorie malnutrition: Secondary | ICD-10-CM | POA: Diagnosis present

## 2024-01-11 DIAGNOSIS — Z7189 Other specified counseling: Secondary | ICD-10-CM | POA: Diagnosis not present

## 2024-01-11 DIAGNOSIS — J690 Pneumonitis due to inhalation of food and vomit: Principal | ICD-10-CM | POA: Diagnosis present

## 2024-01-11 DIAGNOSIS — I959 Hypotension, unspecified: Secondary | ICD-10-CM | POA: Diagnosis present

## 2024-01-11 DIAGNOSIS — R54 Age-related physical debility: Secondary | ICD-10-CM | POA: Diagnosis present

## 2024-01-11 DIAGNOSIS — F419 Anxiety disorder, unspecified: Secondary | ICD-10-CM | POA: Diagnosis present

## 2024-01-11 DIAGNOSIS — Z681 Body mass index (BMI) 19 or less, adult: Secondary | ICD-10-CM | POA: Diagnosis not present

## 2024-01-11 DIAGNOSIS — E440 Moderate protein-calorie malnutrition: Secondary | ICD-10-CM | POA: Insufficient documentation

## 2024-01-11 DIAGNOSIS — Z993 Dependence on wheelchair: Secondary | ICD-10-CM

## 2024-01-11 DIAGNOSIS — I5030 Unspecified diastolic (congestive) heart failure: Secondary | ICD-10-CM

## 2024-01-11 DIAGNOSIS — R579 Shock, unspecified: Secondary | ICD-10-CM

## 2024-01-11 DIAGNOSIS — E860 Dehydration: Secondary | ICD-10-CM | POA: Diagnosis present

## 2024-01-11 DIAGNOSIS — N2581 Secondary hyperparathyroidism of renal origin: Secondary | ICD-10-CM | POA: Diagnosis present

## 2024-01-11 DIAGNOSIS — L97429 Non-pressure chronic ulcer of left heel and midfoot with unspecified severity: Secondary | ICD-10-CM | POA: Diagnosis present

## 2024-01-11 DIAGNOSIS — I120 Hypertensive chronic kidney disease with stage 5 chronic kidney disease or end stage renal disease: Secondary | ICD-10-CM | POA: Diagnosis not present

## 2024-01-11 DIAGNOSIS — J189 Pneumonia, unspecified organism: Secondary | ICD-10-CM | POA: Diagnosis not present

## 2024-01-11 DIAGNOSIS — I5032 Chronic diastolic (congestive) heart failure: Secondary | ICD-10-CM

## 2024-01-11 DIAGNOSIS — Z7401 Bed confinement status: Secondary | ICD-10-CM

## 2024-01-11 DIAGNOSIS — Z86711 Personal history of pulmonary embolism: Secondary | ICD-10-CM

## 2024-01-11 LAB — GLUCOSE, CAPILLARY
Glucose-Capillary: 156 mg/dL — ABNORMAL HIGH (ref 70–99)
Glucose-Capillary: 46 mg/dL — ABNORMAL LOW (ref 70–99)
Glucose-Capillary: 110 mg/dL — ABNORMAL HIGH (ref 70–99)
Glucose-Capillary: 95 mg/dL (ref 70–99)
Glucose-Capillary: 101 mg/dL — ABNORMAL HIGH (ref 70–99)
Glucose-Capillary: 118 mg/dL — ABNORMAL HIGH (ref 70–99)
Glucose-Capillary: 64 mg/dL — ABNORMAL LOW (ref 70–99)

## 2024-01-11 LAB — PROTIME-INR
INR: 1.4 — ABNORMAL HIGH (ref 0.8–1.2)
Prothrombin Time: 17.2 s — ABNORMAL HIGH (ref 11.4–15.2)

## 2024-01-11 LAB — CBC
HCT: 32.8 % — ABNORMAL LOW (ref 36.0–46.0)
Hemoglobin: 10 g/dL — ABNORMAL LOW (ref 12.0–15.0)
MCH: 28.2 pg (ref 26.0–34.0)
MCHC: 30.5 g/dL (ref 30.0–36.0)
MCV: 92.4 fL (ref 80.0–100.0)
Platelets: 33 10*3/uL — ABNORMAL LOW (ref 150–400)
RBC: 3.55 MIL/uL — ABNORMAL LOW (ref 3.87–5.11)
RDW: 16.4 % — ABNORMAL HIGH (ref 11.5–15.5)
WBC: 4 10*3/uL (ref 4.0–10.5)
nRBC: 0.5 % — ABNORMAL HIGH (ref 0.0–0.2)

## 2024-01-11 LAB — COMPREHENSIVE METABOLIC PANEL WITH GFR
ALT: 18 U/L (ref 0–44)
AST: 28 U/L (ref 15–41)
Albumin: 2.5 g/dL — ABNORMAL LOW (ref 3.5–5.0)
Alkaline Phosphatase: 69 U/L (ref 38–126)
Anion gap: 8 (ref 5–15)
BUN: 10 mg/dL (ref 8–23)
CO2: 25 mmol/L (ref 22–32)
Calcium: 8.5 mg/dL — ABNORMAL LOW (ref 8.9–10.3)
Chloride: 98 mmol/L (ref 98–111)
Creatinine, Ser: 1.48 mg/dL — ABNORMAL HIGH (ref 0.44–1.00)
GFR, Estimated: 38 mL/min — ABNORMAL LOW (ref >60)
Glucose, Bld: 77 mg/dL (ref 70–99)
Potassium: 3.4 mmol/L — ABNORMAL LOW (ref 3.5–5.1)
Sodium: 131 mmol/L — ABNORMAL LOW (ref 135–145)
Total Bilirubin: 0.6 mg/dL (ref 0.0–1.2)
Total Protein: 7.1 g/dL (ref 6.5–8.1)

## 2024-01-11 LAB — MRSA NEXT GEN BY PCR, NASAL: MRSA by PCR Next Gen: NOT DETECTED

## 2024-01-11 LAB — TROPONIN I (HIGH SENSITIVITY)
Troponin I (High Sensitivity): 3 ng/L (ref <18)
Troponin I (High Sensitivity): 2 ng/L (ref <18)

## 2024-01-11 LAB — CORTISOL: Cortisol, Plasma: 11 ug/dL

## 2024-01-11 LAB — LIPASE, BLOOD: Lipase: 29 U/L (ref 11–51)

## 2024-01-11 LAB — PROCALCITONIN: Procalcitonin: 0.72 ng/mL

## 2024-01-11 LAB — LACTIC ACID, PLASMA: Lactic Acid, Venous: 1 mmol/L (ref 0.5–1.9)

## 2024-01-11 MED ORDER — SODIUM CHLORIDE 0.9 % IV SOLN: 3.0000 g | INTRAVENOUS | Status: DC

## 2024-01-11 MED ORDER — DEXTROSE 50 % IV SOLN: 12.5000 g | INTRAVENOUS | Status: DC

## 2024-01-11 MED ORDER — SODIUM CHLORIDE 0.9 % IV SOLN
3.0000 g | Freq: Once | INTRAVENOUS | Status: AC
  Administered 2024-01-11: 3 g via INTRAVENOUS
  Filled 2024-01-11: qty 8

## 2024-01-11 MED ORDER — LACTATED RINGERS IV BOLUS
500.0000 mL | Freq: Once | INTRAVENOUS | Status: AC
  Administered 2024-01-11: 500 mL via INTRAVENOUS

## 2024-01-11 MED ORDER — VANCOMYCIN HCL IN DEXTROSE 1-5 GM/200ML-% IV SOLN
1000.0000 mg | Freq: Once | INTRAVENOUS | Status: AC
  Administered 2024-01-11: 1000 mg via INTRAVENOUS
  Filled 2024-01-11: qty 200

## 2024-01-11 MED ORDER — SODIUM CHLORIDE 0.9 % IV SOLN: 250.0000 mL | INTRAVENOUS | Status: AC

## 2024-01-11 MED ORDER — PNEUMOCOCCAL 20-VAL CONJ VACC 0.5 ML IM SUSY
0.5000 mL | PREFILLED_SYRINGE | INTRAMUSCULAR | Status: AC
  Administered 2024-01-18: 0.5 mL via INTRAMUSCULAR
  Filled 2024-01-11 (×2): qty 0.5

## 2024-01-11 MED ORDER — ALBUMIN HUMAN 25 % IV SOLN
25.0000 g | Freq: Four times a day (QID) | INTRAVENOUS | Status: AC
  Administered 2024-01-11 (×2): 25 g via INTRAVENOUS
  Administered 2024-01-12: 12.5 g via INTRAVENOUS
  Administered 2024-01-12: 25 g via INTRAVENOUS
  Filled 2024-01-11 (×4): qty 100

## 2024-01-11 MED ORDER — APIXABAN 5 MG PO TABS
5.0000 mg | ORAL_TABLET | Freq: Two times a day (BID) | ORAL | Status: DC
  Administered 2024-01-11: 5 mg via ORAL
  Filled 2024-01-11: qty 1

## 2024-01-11 MED ORDER — VANCOMYCIN VARIABLE DOSE PER UNSTABLE RENAL FUNCTION (PHARMACIST DOSING): Status: DC

## 2024-01-11 MED ORDER — HYDROCORTISONE SOD SUC (PF) 100 MG IJ SOLR
100.0000 mg | Freq: Two times a day (BID) | INTRAMUSCULAR | Status: DC
  Administered 2024-01-11 – 2024-01-12 (×2): 100 mg via INTRAVENOUS
  Filled 2024-01-11 (×2): qty 2

## 2024-01-11 MED ORDER — LACTATED RINGERS IV BOLUS
1000.0000 mL | Freq: Once | INTRAVENOUS | Status: AC
  Administered 2024-01-11: 1000 mL via INTRAVENOUS

## 2024-01-11 MED ORDER — DEXTROSE-SODIUM CHLORIDE 5-0.9 % IV SOLN: INTRAVENOUS | Status: DC

## 2024-01-11 MED ORDER — DEXTROSE-SODIUM CHLORIDE 10-0.45 % IV SOLN
INTRAVENOUS | Status: DC
  Filled 2024-01-11: qty 1000

## 2024-01-11 MED ORDER — ACETAMINOPHEN 325 MG PO TABS: 650.0000 mg | ORAL_TABLET | Freq: Four times a day (QID) | ORAL | Status: DC | PRN

## 2024-01-11 MED ORDER — SODIUM CHLORIDE 0.9 % IV SOLN
2.0000 g | Freq: Once | INTRAVENOUS | Status: AC
  Administered 2024-01-11: 2 g via INTRAVENOUS
  Filled 2024-01-11: qty 20

## 2024-01-11 MED ORDER — DEXTROSE 50 % IV SOLN
INTRAVENOUS | Status: AC
Start: 2024-01-11 — End: 2024-01-11
  Filled 2024-01-11: qty 50

## 2024-01-11 MED ORDER — RENA-VITE PO TABS
1.0000 | ORAL_TABLET | ORAL | Status: DC
  Administered 2024-01-12 – 2024-01-17 (×4): 1 via ORAL
  Filled 2024-01-11 (×5): qty 1

## 2024-01-11 MED ORDER — ACETAMINOPHEN 650 MG RE SUPP: 650.0000 mg | Freq: Four times a day (QID) | RECTAL | Status: DC | PRN

## 2024-01-11 MED ORDER — FUROSEMIDE 40 MG PO TABS: 80.0000 mg | ORAL_TABLET | Freq: Every day | ORAL | Status: DC

## 2024-01-11 MED ORDER — CHLORHEXIDINE GLUCONATE CLOTH 2 % EX PADS
6.0000 | MEDICATED_PAD | Freq: Every day | CUTANEOUS | Status: DC
  Administered 2024-01-11 – 2024-01-14 (×4): 6 via TOPICAL

## 2024-01-11 MED ORDER — DEXTROSE 10 % IV SOLN: INTRAVENOUS | Status: DC

## 2024-01-11 MED ORDER — MIDODRINE HCL 5 MG PO TABS
5.0000 mg | ORAL_TABLET | Freq: Three times a day (TID) | ORAL | Status: DC
  Administered 2024-01-11 – 2024-01-12 (×3): 5 mg via ORAL
  Filled 2024-01-11 (×3): qty 1

## 2024-01-11 MED ORDER — DEXTROSE 50 % IV SOLN
12.5000 g | INTRAVENOUS | Status: AC
  Administered 2024-01-11: 12.5 g via INTRAVENOUS

## 2024-01-11 MED ORDER — MEDIHONEY WOUND/BURN DRESSING EX PSTE
1.0000 | PASTE | Freq: Every day | CUTANEOUS | Status: DC
  Administered 2024-01-11 – 2024-01-16 (×5): 1 via TOPICAL
  Filled 2024-01-11 (×3): qty 44

## 2024-01-11 MED ORDER — IOHEXOL 350 MG/ML SOLN
75.0000 mL | Freq: Once | INTRAVENOUS | Status: AC | PRN
  Administered 2024-01-11: 75 mL via INTRAVENOUS

## 2024-01-11 MED ORDER — NOREPINEPHRINE 4 MG/250ML-% IV SOLN
2.0000 ug/min | INTRAVENOUS | Status: DC
Start: 2024-01-11 — End: 2024-01-13
  Filled 2024-01-11: qty 250

## 2024-01-11 MED ORDER — DEXTROSE 50 % IV SOLN: 25.0000 g | INTRAVENOUS | Status: DC

## 2024-01-11 MED ORDER — DEXTROSE 50 % IV SOLN
INTRAVENOUS | Status: AC
  Administered 2024-01-11: 50 mL
  Filled 2024-01-11: qty 50

## 2024-01-11 MED ORDER — SODIUM CHLORIDE 0.9 % IV SOLN
500.0000 mg | Freq: Once | INTRAVENOUS | Status: AC
  Administered 2024-01-11: 500 mg via INTRAVENOUS
  Filled 2024-01-11: qty 5

## 2024-01-11 MED ORDER — SEVELAMER CARBONATE 800 MG PO TABS
800.0000 mg | ORAL_TABLET | Freq: Three times a day (TID) | ORAL | Status: DC
  Administered 2024-01-11 – 2024-01-15 (×10): 800 mg via ORAL
  Filled 2024-01-11 (×11): qty 1

## 2024-01-11 NOTE — Consult Note (Signed)
 NAME:  Kara Mullins, MRN:  [id number], DOB:  28-Mar-1951, LOS: 0 ADMISSION DATE:  01/11/2024, CONSULTATION DATE:  01/11/24 REFERRING MD:  Ella Jubilee, CHIEF COMPLAINT:  SOB   History of Present Illness:  73 year old woman w/ history of longstanding ESRD, chronic pleural effusions, recent flu/PE admit presenting with FTT, SOB.  Found to be hypotensive.  Workup includes a CTA which shows likely aspiration.  BP has been soft on floor despite fluid so she is brought to ICU to consider peripheral pressors.  Her only compliant is some mild dyspnea and anxiety.  Pertinent  Medical History  ESRD on HD via fistula HFpEF HTN HLD  Significant Hospital Events: Including procedures, antibiotic start and stop dates in addition to other pertinent events   3/30 admit to 4e then to 51m  Interim History / Subjective:  consult  Objective   Blood pressure (!) 93/54, pulse 67, temperature (!) 95.9 F (35.5 C), temperature source Rectal, resp. rate (!) 21, height 5\' 5"  (1.651 m), weight 51 kg, SpO2 98%.        Intake/Output Summary (Last 24 hours) at 01/11/2024 1512 Last data filed at 01/11/2024 0755 Gross per 24 hour  Intake 350 ml  Output --  Net 350 ml   Filed Weights   01/11/24 0214 01/11/24 0852  Weight: 48.1 kg 51 kg    Examination: General: nontoxic appearing, chronically ill HENT: temporal wasting, MMM, trachea midline Lungs: diminished bases, nonlabored breathing pattern Cardiovascular: RRR, pronounced P2 Abdomen: soft, +BS Extremities: chronic PAD and venous stasis changes, no edema Neuro: moves to command but profoundly weak, LUE large fistula  Labs look benign CTA reviewed  Resolved Hospital Problem list   N/A  Assessment & Plan:  Shock, hypoglycemia- some combination septic vs. Hypovolemic vs. Home med effect Likely aspiration as consequence of progressive FTT, weakness Recent PE now no longer visible on CTA Chronic pleural effusions Severe protein calorie malnutrition  POA ESRD on HD, fistula may be contributing to RV dysfunction HFpEF Thrombocytopenia, acute on chronic- spleen looks okay on CTA so not sequestration, Hgb stable and bili stable so not bleeding or hemolysis; query lingering influenza effect vs just bone marrow failure Chronic leukopenia FTT, frail elderly  - Hold all antihypertensives - Levophed peripheral for MAP 65 - Check lactate, if normal can probably just make a lower MAP goal, also need to assure cuff is in right spot - Albumin 25% 100g + another 500cc LR - Will give trial of midodrine, would not be surprised if she ends up needing this long term - Hold eliquis with worsening thrombocytopenia - Dose of vanc, dc if MRSA PCR neg, continue unasyn - Check Pct, cortisol; okay for some empiric stress steroids but if cortisol less than 10 should DC - SLP consult - PT/OT - If goes wrong direction will need another GOC with daughter and her: to date they want all aggressive measures - Limited echo  Best Practice (right click and "Reselect all SmartList Selections" daily)   Diet/type: Regular consistency (see orders) DVT prophylaxis SCD Pressure ulcer(s): present on admission  GI prophylaxis: N/A Lines: N/A Foley:  N/A Code Status:  full code Last date of multidisciplinary goals of care discussion [daughter updated]  Labs   CBC: Recent Labs  Lab 01/11/24 0228  WBC 4.0  HGB 10.0*  HCT 32.8*  MCV 92.4  PLT 33*    Basic Metabolic Panel: Recent Labs  Lab 01/11/24 0228  NA 131*  K 3.4*  CL 98  CO2  25  GLUCOSE 77  BUN 10  CREATININE 1.48*  CALCIUM 8.5*   GFR: Estimated Creatinine Clearance: 28.1 mL/min (A) (by C-G formula based on SCr of 1.48 mg/dL (H)). Recent Labs  Lab 01/11/24 0228  WBC 4.0    Liver Function Tests: Recent Labs  Lab 01/11/24 0228  AST 28  ALT 18  ALKPHOS 69  BILITOT 0.6  PROT 7.1  ALBUMIN 2.5*   Recent Labs  Lab 01/11/24 0228  LIPASE 29   No results for input(s): "AMMONIA" in  the last 168 hours.  ABG    Component Value Date/Time   TCO2 24 06/02/2022 0337     Coagulation Profile: Recent Labs  Lab 01/11/24 0228  INR 1.4*    Cardiac Enzymes: No results for input(s): "CKTOTAL", "CKMB", "CKMBINDEX", "TROPONINI" in the last 168 hours.  HbA1C: No results found for: "HGBA1C"  CBG: Recent Labs  Lab 01/11/24 1135 01/11/24 1159 01/11/24 1249 01/11/24 1405 01/11/24 1458  GLUCAP 46* 156* 110* 95 101*    Review of Systems:    Positive Symptoms in bold:  Constitutional fevers, chills, weight loss, fatigue, anorexia, malaise  Eyes decreased vision, double vision, eye irritation  Ears, Nose, Mouth, Throat sore throat, trouble swallowing, sinus congestion  Cardiovascular chest pain, paroxysmal nocturnal dyspnea, lower ext edema, palpitations   Respiratory SOB, cough, DOE, hemoptysis, wheezing  Gastrointestinal nausea, vomiting, diarrhea  Genitourinary burning with urination, trouble urinating  Musculoskeletal joint aches, joint swelling, back pain  Integumentary  rashes, skin lesions  Neurological focal weakness, focal numbness, trouble speaking, headaches  Psychiatric depression, anxiety, confusion  Endocrine polyuria, polydipsia, cold intolerance, heat intolerance  Hematologic abnormal bruising, abnormal bleeding, unexplained nose bleeds  Allergic/Immunologic recurrent infections, hives, swollen lymph nodes     Past Medical History:  She,  has a past medical history of Chronic combined systolic and diastolic congestive heart failure (HCC) (11/09/2021), Generalized anxiety disorder, Hyperlipidemia (11/09/2021), Hypertension, and Stage 3b chronic kidney disease (CKD) (HCC) (11/09/2021).   Surgical History:   Past Surgical History:  Procedure Laterality Date   AV FISTULA PLACEMENT Left 03/27/2023   Procedure: LEFT ARM BRACHIOCEPHALIC ARTERIOVENOUS (AV) FISTULA CREATION;  Surgeon: [name], MD;  Location: [facility] OR;  Service: Vascular;   Laterality: Left;   CATARACT EXTRACTION, BILATERAL     combined systolic and diastolic congestive heart failure      INSERTION OF DIALYSIS CATHETER N/A 03/27/2023   Procedure: INSERTION OF RIGHT INTERNAL JUGULAR TUNNELED DIALYSIS CATHETER;  Surgeon: [name], MD;  Location: [facility] OR;  Service: Vascular;  Laterality: N/A;   IR THORACENTESIS ASP PLEURAL SPACE W/IMG GUIDE  06/12/2023     Social History:   reports that she has never smoked. She has never used smokeless tobacco. She reports that she does not currently use alcohol. She reports that she does not use drugs.   Family History:  Her family history includes Throat cancer in her father.   Allergies Allergies  Allergen Reactions   Crestor [Rosuvastatin] Shortness Of Breath   Nitrofuran Derivatives Nausea Only     Home Medications  Prior to Admission medications   Medication Sig Start Date End Date Taking? Authorizing Provider  APIXABAN [name] Take as directed on package: start with two-5mg  tablets twice daily for 7 days. On day 8, switch to one-5mg  tablet twice daily. 12/20/23  Yes [name], [name], DO  carvedilol (COREG) 3.125 MG tablet Take 3.125 mg by mouth 2 (two) times daily.   Yes  [provider]  furosemide (LASIX) 80 MG tablet Take 1 tablet (80 mg total) by mouth every Monday, Wednesday, Friday and Sunday. 11/20/23  Yes [name], [name], NP  lidocaine-prilocaine (EMLA) cream APPLY 1 APPLICATION TOPICALLY AS NEEDED.(USE 30 MINUTES PRIOR TO DIALYSIS). 12/29/23  Yes [name], [name], NP  Menthol-Methyl Salicylate (MUSCLE RUB) 10-15 % CREA Apply 1 Application topically daily as needed for muscle pain.   Yes [provider]  multivitamin (RENA-VIT) TABS tablet Take 1 tablet by mouth daily. Patient taking differently: Take 1 tablet by mouth See admin instructions. Take 1 tablet by mouth every Mon, Wed, Fri, Sat 06/19/23  Yes [name], [name], NP  OVER THE COUNTER MEDICATION  Take 1 fluid ounce by mouth daily. Liquid Cell Protein   Yes [provider]  sevelamer carbonate (RENVELA) 800 MG tablet Take 1 tablet (800 mg total) by mouth 3 (three) times daily with meals. 08/29/23  Yes [name], [name], NP  silver sulfADIAZINE (SILVADENE) 1 % cream Apply 1 Application topically daily. 12/02/23  Yes [name], [name]  fluticasone (FLONASE) 50 MCG/ACT nasal spray Place 2 sprays into both nostrils daily. Patient not taking: Reported on 01/11/2024 12/09/23   [name], [name]  isosorbide mononitrate (IMDUR) 30 MG 24 hr tablet Take 0.5 tablets (15 mg total) by mouth daily. Patient not taking: Reported on 01/11/2024 12/23/23   [name], MD     Critical care time: 32 mins

## 2024-01-11 NOTE — ED Notes (Signed)
warm blankets applied

## 2024-01-11 NOTE — Progress Notes (Signed)
 Pharmacy Antibiotic Note  Kara Mullins is a 73 y.o. female admitted on 01/11/2024 with  aspiration pneumonia .  Pharmacy has been consulted for Unasyn dosing.  Chest CT with left lower lobe infiltrate suggestive aspiration  Patient is ESRD on HD, last HD yesterday 3/29 WBC 4, afebrile  Plan: Unasyn 3g IV every 24 hours Monitor dialysis schedule, imaging, CBC, cultures, and signs of clinical improvement  Height: 5\' 5"  (165.1 cm) Weight: 51 kg (112 lb 7 oz) IBW/kg (Calculated) : 57  Temp (24hrs), Avg:95.1 F (35.1 C), Min:94.3 F (34.6 C), Max:97.2 F (36.2 C)  Recent Labs  Lab 01/11/24 0228  WBC 4.0  CREATININE 1.48*    Estimated Creatinine Clearance: 28.1 mL/min (A) (by C-G formula based on SCr of 1.48 mg/dL (H)).    Allergies  Allergen Reactions   Crestor [Rosuvastatin] Shortness Of Breath   Nitrofuran Derivatives Nausea Only    Antimicrobials this admission: 3/30 Unasyn >>   Thank you for allowing pharmacy to be a part of this patient's care.  Stephenie Acres, PharmD PGY1 Pharmacy Resident 01/11/2024 1:29 PM

## 2024-01-11 NOTE — Progress Notes (Signed)
 Patient with worsening hypotension, despite IV fluids resuscitation, further IV fluids limited due to ESRD.  Start peripheral norepinephrine to keep MAP 65  Consult critical care.

## 2024-01-11 NOTE — Progress Notes (Signed)
 Provider: [name] FNP-C  Brynnan Rodenbaugh, [name], NP  Patient Care Team: Rhet Rorke, [name], NP as PCP - General (Family Medicine) [name], MD as PCP - Cardiology (Cardiology) Darnell Level, MD as PCP - Internal Medicine (Internal Medicine) Ginette Otto [facility]  Extended Emergency Contact Information Primary Emergency Contact: [facility] Phone: [phone] Relation: Daughter Secondary Emergency Contact: Chamberlin,Curtis Mobile Phone: [phone] Relation: Son  Code Status:  Full Code  Goals of care: Advanced Directive information    01/11/2024    2:17 AM  Advanced Directives  Does Patient Have a Medical Advance Directive? Yes  Type of Advance Directive Living will;Healthcare Power of Attorney     Chief Complaint  Patient presents with   Hospitalization Follow-up    HOSPITAL FOLLOW UP   Discussed the use of AI scribe software for clinical note transcription with the patient, who gave verbal consent to proceed.  History of Present Illness   Kara Mullins is a 73 year old female with end stage renal disease, congestive heart failure, and hypertension who presents with shortness of breath. She is accompanied by her caregiver.  She recently experienced shortness of breath, leading to an emergency department visit. A week and a half ago, she was hospitalized for flu and pneumonia, with discharge on March 8th. She returned to the emergency room due to difficulty breathing and was diagnosed with a pulmonary embolism. She is currently on Eliquis, taking one pill twice a day. No chest pain, palpitations, constipation, or significant anxiety or depression.  A CT scan confirmed a small pulmonary embolism in the right upper lobe, and she tested positive for the flu. Her BNP was 413, indicating fluid overload, correlating with her shortness of breath. She has bilateral ankle edema, which has decreased.  She has end stage renal  disease and undergoes hemodialysis three times a week (Tuesday, Thursday, Saturday). Recent lab work showed a creatinine level of 1.84, improved from 3.0, and a GFR of 29, improved from 12 to 20. Her hemoglobin is stable around 11.0, but platelets have decreased to 61 from 93. She experiences occasional epistaxis.  She has a wound on her posterior left leg, approximately 1.5 cm in diameter, without surrounding erythema or warmth. Another wound on her heel, caused by recliner friction, is treated with cream and protected with a pillow. She has occasional scratches on her bottom, managed with cream and frequent diaper changes.  She uses a bus for transportation to dialysis and can transfer in and out of the car with assistance. She is working on improving her nutrition, particularly increasing protein intake to gain weight, and is considering trying protein supplements again after previously experiencing diarrhea.    Past Medical History:  Diagnosis Date   Chronic combined systolic and diastolic congestive heart failure (HCC) 11/09/2021   Generalized anxiety disorder    Hyperlipidemia 11/09/2021   Hypertension    Stage 3b chronic kidney disease (CKD) (HCC) 11/09/2021   Past Surgical History:  Procedure Laterality Date   AV FISTULA PLACEMENT Left 03/27/2023   Procedure: LEFT ARM BRACHIOCEPHALIC ARTERIOVENOUS (AV) FISTULA CREATION;  Surgeon: [name], MD;  Location: [facility] OR;  Service: Vascular;  Laterality: Left;   CATARACT EXTRACTION, BILATERAL     combined systolic and diastolic congestive heart failure      INSERTION OF DIALYSIS CATHETER N/A 03/27/2023   Procedure: INSERTION OF RIGHT INTERNAL JUGULAR TUNNELED DIALYSIS CATHETER;  Surgeon: [name], MD;  Location: [facility] OR;  Service: Vascular;  Laterality: N/A;   IR THORACENTESIS ASP PLEURAL SPACE W/IMG GUIDE  06/12/2023    Allergies  Allergen Reactions   Crestor [Rosuvastatin] Shortness Of Breath   Nitrofuran Derivatives Nausea  Only    No facility-administered encounter medications on file as of 12/30/2023.   Outpatient Encounter Medications as of 12/30/2023  Medication Sig   APIXABAN (ELIQUIS) VTE STARTER PACK (10MG  AND 5MG ) Take as directed on package: start with two-5mg  tablets twice daily for 7 days. On day 8, switch to one-5mg  tablet twice daily.   carvedilol (COREG) 3.125 MG tablet Take 3.125 mg by mouth 2 (two) times daily.   furosemide (LASIX) 80 MG tablet Take 1 tablet (80 mg total) by mouth every Monday, Wednesday, Friday and Sunday.   lidocaine-prilocaine (EMLA) cream APPLY 1 APPLICATION TOPICALLY AS NEEDED.(USE 30 MINUTES PRIOR TO DIALYSIS).   Menthol-Methyl Salicylate (MUSCLE RUB) 10-15 % CREA Apply 1 Application topically daily as needed for muscle pain.   multivitamin (RENA-VIT) TABS tablet Take 1 tablet by mouth daily. (Patient taking differently: Take 1 tablet by mouth See admin instructions. Take 1 tablet by mouth every Mon, Wed, Fri, Sat)   OVER THE COUNTER MEDICATION Take 1 fluid ounce by mouth daily. Liquid Cell Protein   sevelamer carbonate (RENVELA) 800 MG tablet Take 1 tablet (800 mg total) by mouth 3 (three) times daily with meals.   silver sulfADIAZINE (SILVADENE) 1 % cream Apply 1 Application topically daily.   fluticasone (FLONASE) 50 MCG/ACT nasal spray Place 2 sprays into both nostrils daily. (Patient not taking: Reported on 01/11/2024)   isosorbide mononitrate (IMDUR) 30 MG 24 hr tablet Take 0.5 tablets (15 mg total) by mouth daily. (Patient not taking: Reported on 01/11/2024)    Review of Systems  Constitutional:  Negative for appetite change, chills, fatigue, fever and unexpected weight change.  HENT:  Negative for congestion, ear discharge, ear pain, hearing loss, nosebleeds, postnasal drip, rhinorrhea, sinus pressure, sinus pain, sneezing, sore throat, tinnitus and trouble swallowing.   Eyes:  Negative for pain, discharge, redness, itching and visual disturbance.  Respiratory:   Negative for cough, chest tightness, shortness of breath and wheezing.   Cardiovascular:  Negative for chest pain, palpitations and leg swelling.  Gastrointestinal:  Negative for abdominal distention, abdominal pain, blood in stool, constipation, diarrhea, nausea and vomiting.  Endocrine: Negative for cold intolerance, heat intolerance, polydipsia, polyphagia and polyuria.  Genitourinary:  Negative for difficulty urinating, dysuria, flank pain, frequency and urgency.  Musculoskeletal:  Positive for gait problem. Negative for arthralgias, back pain, joint swelling, myalgias, neck pain and neck stiffness.  Skin:  Positive for wound. Negative for color change, pallor and rash.  Neurological:  Negative for dizziness, syncope, speech difficulty, weakness, light-headedness, numbness and headaches.  Hematological:  Does not bruise/bleed easily.  Psychiatric/Behavioral:  Negative for agitation, behavioral problems, confusion, hallucinations, self-injury, sleep disturbance and suicidal ideas. The patient is not nervous/anxious.     Immunization History  Administered Date(s) Administered   Hepb-cpg 07/15/2023, 07/31/2023, 08/15/2023, 09/04/2023, 09/16/2023, 10/02/2023, 12/04/2023   PFIZER Comirnaty(Gray Top)Covid-19 Tri-Sucrose Vaccine 04/28/2021   PFIZER(Purple Top)SARS-COV-2 Vaccination 12/16/2019, 01/12/2020   Unspecified SARS-COV-2 Vaccination 08/21/2021   Zoster Recombinant(Shingrix) 07/09/2021, 08/29/2021   Pertinent  Health Maintenance Due  Topic Date Due   INFLUENZA VACCINE  Never done   MAMMOGRAM  01/27/2024 (Originally 08/05/2002)   DEXA SCAN  01/27/2024 (Originally 08/05/2017)   Colonoscopy  01/27/2024 (Originally 08/05/1997)      12 /22/2023   11:22 AM 10/04/2022   11:50 AM 10/28/2022  9:43 AM 01/27/2023    2:04 PM 03/05/2023    9:04 AM  Fall Risk  Falls in the past year?   0 0 Exclusion - non ambulatory  Was there an injury with Fall?   0 0 0  Fall Risk Category Calculator   0 0  0  Fall Risk Category (Retired)   Low    (RETIRED) Patient Fall Risk Level High fall risk High fall risk     Patient at Risk for Falls Due to   No Fall Risks  History of fall(s);Impaired balance/gait;Impaired mobility  Fall risk Follow up   Falls evaluation completed;Education provided;Falls prevention discussed Falls evaluation completed;Education provided;Falls prevention discussed Falls evaluation completed   Functional Status Survey:    Vitals:   12/30/23 1310  BP: 116/78  Pulse: 60  Resp: 18  Temp: (!) 96.6 F (35.9 C)  SpO2: 99%  Weight: 106 lb (48.1 kg)  Height: 5\' 5"  (1.651 m)   Body mass index is 17.64 kg/m. Physical Exam MEASUREMENTS: Weight- 106 lbs. GENERAL: Alert, cooperative, well developed, no acute distress HEENT: Normocephalic, normal oropharynx, moist mucous membranes, ears and nose normal, no sinus tenderness CHEST: Diminished breath sounds, no wheezes, rhonchi, or crackles CARDIOVASCULAR: Normal heart rate and rhythm, S1 and S2 normal without murmurs ABDOMEN: Soft, non-tender, non-distended, without organomegaly, normal bowel sounds EXTREMITIES: No cyanosis or edema, extremities normal NEUROLOGICAL: Cranial nerves grossly intact, moves all extremities without gross motor or sensory deficit SKIN: 1.5 cm wound on posterior left leg, no surrounding erythema or warmth, some drainage PSYCHIATRY/BEHAVIORAL: Mood stable   Labs reviewed: Recent Labs    02/26/23 0108 02/27/23 0130 03/02/23 0112 03/03/23 0038 03/25/23 1212 03/26/23 0012 12/15/23 0502 12/16/23 0445 12/17/23 0458 12/20/23 1247 01/11/24 0228  NA 140   < > 137   < >  --    < > 134* 132* 135 137 131*  K 3.4*   < > 3.8   < >  --    < > 4.2 4.1 4.0 3.5 3.4*  CL 111   < > 107   < >  --    < > 98 99 95* 98 98  CO2 19*   < > 21*   < >  --    < > 24 24 30 28 25   GLUCOSE 88   < > 104*   < >  --    < > 70 57* 83 93 77  BUN 79*   < > 80*   < >  --    < > 20 25* 16 5* 10  CREATININE 3.94*   < > 4.06*    < >  --    < > 3.15* 3.83* 3.21* 1.84* 1.48*  CALCIUM 7.1*   < > 7.3*   < >  --    < > 7.6* 7.4* 7.8* 9.0 8.5*  MG 2.8*  --  2.5*  --  2.6*  --   --   --   --   --   --   PHOS  --   --   --   --   --    < > 2.9 2.9 2.5  --   --    < > = values in this interval not displayed.   Recent Labs    12/13/23 0044 12/14/23 0540 12/17/23 0458 12/20/23 1247 01/11/24 0228  AST 50*  --   --  30 28  ALT 27  --   --  18 18  ALKPHOS 74  --   --  67 69  BILITOT 0.7  --   --  0.6 0.6  PROT 7.0  --   --  7.3 7.1  ALBUMIN 2.6*   < > 2.4* 2.7* 2.5*   < > = values in this interval not displayed.   Recent Labs    02/25/23 1226 02/26/23 0108 03/22/23 0050 03/23/23 0105 12/13/23 0044 12/14/23 0540 12/16/23 1007 12/20/23 1247 01/11/24 0228  WBC 4.1   < > 3.1*   < > 2.0*   < > 2.2* 2.0* 4.0  NEUTROABS 3.0  --  1.9  --  1.3*  --   --   --   --   HGB 10.3*   < > 11.2*   < > 10.7*   < > 11.3* 11.0* 10.0*  HCT 34.0*   < > 36.3   < > 35.5*   < > 36.8 36.0 32.8*  MCV 94.2   < > 90.3   < > 93.9   < > 90.6 91.4 92.4  PLT 192   < > 171   < > 106*   < > 93* 61* 33*   < > = values in this interval not displayed.   Lab Results  Component Value Date   TSH 2.488 03/24/2023   No results found for: "HGBA1C" Lab Results  Component Value Date   CHOL 179 04/21/2023   HDL 79 04/21/2023   LDLCALC 91 04/21/2023   LDLDIRECT 99 04/21/2023   TRIG 45 04/21/2023   CHOLHDL 2.3 04/21/2023    Significant Diagnostic Results in last 30 days:  CT Angio Chest PE W and/or Wo Contrast Result Date: 01/11/2024 CLINICAL DATA:  Shortness of breath EXAM: CT ANGIOGRAPHY CHEST WITH CONTRAST TECHNIQUE: Multidetector CT imaging of the chest was performed using the standard protocol during bolus administration of intravenous contrast. Multiplanar CT image reconstructions and MIPs were obtained to evaluate the vascular anatomy. RADIATION DOSE REDUCTION: This exam was performed according to the departmental dose-optimization program  which includes automated exposure control, adjustment of the mA and/or kV according to patient size and/or use of iterative reconstruction technique. CONTRAST:  75mL OMNIPAQUE IOHEXOL 350 MG/ML SOLN COMPARISON:  12/20/2023 FINDINGS: Cardiovascular: Satisfactory opacification of the pulmonary arteries to the segmental level. No evidence of pulmonary embolism, right upper lobe embolism on prior is resolved. Enlarged heart size. No pericardial effusion. Extensive atheromatous calcification of the aorta and coronaries. Mediastinum/Nodes: Negative for mass or adenopathy. Upper esophageal fluid level. Lungs/Pleura: There is chronic opacity in the subpleural lower lobes and lingula, worsening opacification at both lung bases, especially in the left lower lobe where there is now multifocal bronchial opacification and obstruction. Continued rounded atelectasis appearance to the lingular opacity and right lower lobe opacity. Upper Abdomen: Extensive arterial calcification.  No acute finding Musculoskeletal: Body wall edema. Exaggerated thoracic kyphosis and mild scoliosis. Review of the MIP images confirms the above findings. IMPRESSION: 1. Bronchial airway filling the lung bases since recent comparison with pneumonia superimposed on chronic lung disease at the left base. Question interval aspiration. 2. Pulmonary embolism seen in the right upper lobe on prior has resolved. No interval pulmonary embolism. 3. Cardiomegaly and body wall edema. Electronically Signed   By: Tiburcio Pea M.D.   On: 01/11/2024 05:14   DG Chest Portable 1 View Result Date: 01/11/2024 CLINICAL DATA:  Shortness of breath.  Dialysis.  Recent PE. EXAM: PORTABLE CHEST 1 VIEW COMPARISON:  Radiographs and CT from  12/20/2023 FINDINGS: Stable cardiomegaly. Aortic atherosclerotic calcification. Small to moderate left greater than right pleural effusions and associated airspace opacities. Pulmonary vascular congestion. No pneumothorax. No displaced rib  fractures. IMPRESSION: Small moderate left greater than right pleural effusions similar to 12/20/2023. Basilar airspace opacities may be due to atelectasis or pneumonia. Electronically Signed   By: Minerva Fester M.D.   On: 01/11/2024 02:39   CT Angio Chest PE W/Cm &/Or Wo Cm Result Date: 12/20/2023 CLINICAL DATA:  Pulmonary embolism (PE) suspected, high prob Shortness of breath.  Recent hospital admission for flu. EXAM: CT ANGIOGRAPHY CHEST WITH CONTRAST TECHNIQUE: Multidetector CT imaging of the chest was performed using the standard protocol during bolus administration of intravenous contrast. Multiplanar CT image reconstructions and MIPs were obtained to evaluate the vascular anatomy. RADIATION DOSE REDUCTION: This exam was performed according to the departmental dose-optimization program which includes automated exposure control, adjustment of the mA and/or kV according to patient size and/or use of iterative reconstruction technique. CONTRAST:  75mL OMNIPAQUE IOHEXOL 350 MG/ML SOLN COMPARISON:  Radiograph earlier today. Chest CT 12/13/2023, CT 03/23/2023 FINDINGS: Cardiovascular: Isolated subsegmental pulmonary embolus in the right upper lobe, series 7, image 122. Thromboembolic burden is small. Size of thrombus does not indicate measuring RV to LV ratio. No other pulmonary arterial filling defects. The heart is mildly enlarged. There are coronary artery calcifications. Atherosclerosis of the thoracic aorta. No pericardial effusion. Mediastinum/Nodes: No mediastinal or hilar adenopathy. Patulous upper esophagus. Lungs/Pleura: Small bilateral pleural effusions are similar to prior exam. Unchanged area of masslike consolidation in the right lower lobe, series 6, image 110, indeterminate for infection or chronic round atelectasis. Nodular and consolidative opacities in the left lower lobe have improved. Lingular subpleural nodule measuring 2.2 x 1.6 cm, series 6, image 81 has minimally decreased in size,  previously 2.5 x 1.7 cm. No new airspace disease. No features of pulmonary edema. Upper Abdomen: Vascular calcifications.  No acute findings. Musculoskeletal: Exaggerated thoracic kyphosis with thoracic spondylosis. No acute osseous findings. Generalized paucity of subcutaneous fat. Review of the MIP images confirms the above findings. IMPRESSION: 1. Isolated subsegmental right upper lobe pulmonary embolus. Small thromboembolic burden. 2. Small bilateral pleural effusions are unchanged over the last week. Stable area of masslike consolidation in the right lower lobe which may represent chronic round atelectasis or pneumonia. There is overall improvement in the consolidation in the left lower lobe from prior. 3. Subpleural nodule in the lingula has slightly decreased over the last week. Recommend CT follow-up in 1 month. Critical Value/emergent results were called by telephone at the time of interpretation on 12/20/2023 at 6:46 pm to provider [name], who verbally acknowledged these results. Aortic Atherosclerosis (ICD10-I70.0). Electronically Signed   By: Narda Rutherford M.D.   On: 12/20/2023 18:46   DG Chest Port 1 View Result Date: 12/20/2023 CLINICAL DATA:  Shortness of breath. EXAM: PORTABLE CHEST 1 VIEW COMPARISON:  December 13, 2023. FINDINGS: Stable cardiomediastinal silhouette. Minimal bibasilar subsegmental atelectasis or scarring is noted with small pleural effusions. Bony thorax is unremarkable. IMPRESSION: Minimal bibasilar subsegmental atelectasis or scarring is noted with small pleural effusions. Electronically Signed   By: Lupita Raider M.D.   On: 12/20/2023 13:12   DG Swallowing Func-Speech Pathology Result Date: 12/15/2023 Table formatting from the original result was not included. Modified Barium Swallow Study Patient Details Name: Debralee Braaksma MRN: [id number] Date of Birth: 09/07/1951 Today's Date: 12/15/2023 HPI/PMH: HPI: Vernice Mannina is a 73 yo female presenting to ED 3/1 with AMS. Recently  diagnosed with Influenza A 2/25. CT Chest shows small bilateral pleural effusions and consolidative changes in both lower lobes, atelectasis vs superimposed multifocal PNA. Previously seen by SLP and ultimately recommended to continue regular diet with thin liquids. PMH includes ESRD on HD, HTN, HLD, chronic combined systolic/diastolic heart failure Clinical Impression: Clinical Impression: Pt's oropharyngeal swallow is grossly functional, with concern for primary esophageal dysphagia. Orally she has good lingual control and posterior propulsion for clearance, but she does exhibit piecemeal swallowing with thicker consistencies, starting with honey thick liquids. She uses a liquid wash PRN to facilitate this and says that she has been doing this more lately because it feels like it goes down better. Her swallow is triggered on the laryngeal side of the epiglottis and so there is transient penetration with thin liquids, but it mostly clears upon completion of the swallow or subsequent swallows  (PAS 2).  No aspiration is observed. Upon esophageal sweep, pt's esophagus appeared to fairly filled with barium with retrograde flow noted below the PES. If pt does have intermittent aspiration occuring, suspect that it is post-prandial in nature. Education was offered about esophageal precautions and MD notified as well. DIGEST Swallow Severity Rating*  Safety: 0  Efficiency: 0  Overall Pharyngeal Swallow Severity: 0 1: mild; 2: moderate; 3: severe; 4: profound *The Dynamic Imaging Grade of Swallowing Toxicity is standardized for the head and neck cancer population, however, demonstrates promising clinical applications across populations to standardize the clinical rating of pharyngeal swallow safety and severity. Factors that may increase risk of adverse event in presence of aspiration Rubye Oaks & Clearance Coots 2021): Factors that may increase risk of adverse event in presence of aspiration Rubye Oaks & Clearance Coots 2021): Respiratory or  GI disease; Frail or deconditioned Recommendations/Plan: Swallowing Evaluation Recommendations Swallowing Evaluation Recommendations Recommendations: PO diet PO Diet Recommendation: Regular; Thin liquids (Level 0) Liquid Administration via: Cup; Straw Medication Administration: Whole meds with liquid Supervision: Patient able to self-feed; Intermittent supervision/cueing for swallowing strategies Swallowing strategies  : Slow rate; Small bites/sips; Follow solids with liquids Postural changes: Position pt fully upright for meals; Stay upright 30-60 min after meals Oral care recommendations: Oral care BID (2x/day) Recommended consults: Consider GI consultation; Consider esophageal assessment Treatment Plan Treatment Plan Treatment recommendations: Therapy as outlined in treatment plan below Follow-up recommendations: No SLP follow up Functional status assessment: Patient has had a recent decline in their functional status and demonstrates the ability to make significant improvements in function in a reasonable and predictable amount of time. Treatment frequency: Min 2x/week Treatment duration: 1 week Interventions: Aspiration precaution training; Compensatory techniques; Patient/family education; Diet toleration management by SLP Recommendations Recommendations for follow up therapy are one component of a multi-disciplinary discharge planning process, led by the attending physician.  Recommendations may be updated based on patient status, additional functional criteria and insurance authorization. Assessment: Orofacial Exam: Orofacial Exam Oral Cavity - Dentition: Edentulous Anatomy: Anatomy: WFL Boluses Administered: Boluses Administered Boluses Administered: Thin liquids (Level 0); Mildly thick liquids (Level 2, nectar thick); Moderately thick liquids (Level 3, honey thick); Puree; Solid  Oral Impairment Domain: Oral Impairment Domain Lip Closure: No labial escape Tongue control during bolus hold: Cohesive bolus  between tongue to palatal seal Bolus preparation/mastication: Slow prolonged chewing/mashing with complete recollection Bolus transport/lingual motion: Delayed initiation of tongue motion (oral holding) Oral residue: Majority of bolus remaining (piecemeal swallowing) Location of oral residue : Tongue Initiation of pharyngeal swallow : Posterior laryngeal surface of the epiglottis  Pharyngeal Impairment Domain: Pharyngeal Impairment Domain Soft  palate elevation: No bolus between soft palate (SP)/pharyngeal wall (PW) Laryngeal elevation: Complete superior movement of thyroid cartilage with complete approximation of arytenoids to epiglottic petiole Anterior hyoid excursion: Complete anterior movement Epiglottic movement: Complete inversion Laryngeal vestibule closure: Complete, no air/contrast in laryngeal vestibule Pharyngeal stripping wave : Present - complete Pharyngeal contraction (A/P view only): N/A Tongue base retraction: No contrast between tongue base and posterior pharyngeal wall (PPW) Pharyngeal residue: Complete pharyngeal clearance Location of pharyngeal residue: N/A  Esophageal Impairment Domain: Esophageal Impairment Domain Esophageal clearance upright position: Esophageal retention with retrograde flow below pharyngoesophageal segment (PES) Pill: Pill Consistency administered: Thin liquids (Level 0) Thin liquids (Level 0): Impaired (see clinical impressions) Penetration/Aspiration Scale Score: Penetration/Aspiration Scale Score 1.  Material does not enter airway: Mildly thick liquids (Level 2, nectar thick); Moderately thick liquids (Level 3, honey thick); Puree; Solid; Pill 2.  Material enters airway, remains ABOVE vocal cords then ejected out: Thin liquids (Level 0) Compensatory Strategies: No data recorded  General Information: Caregiver present: No  Diet Prior to this Study: Regular; Thin liquids (Level 0)   Temperature : Normal   Respiratory Status: WFL   Supplemental O2: None (Room air)   History  of Recent Intubation: No  Behavior/Cognition: Alert; Cooperative; Pleasant mood Self-Feeding Abilities: Able to self-feed Baseline vocal quality/speech: Normal No data recorded Volitional Swallow: Able to elicit Exam Limitations: No limitations Goal Planning: Prognosis for improved oropharyngeal function: Good No data recorded No data recorded Patient/Family Stated Goal: none stated Consulted and agree with results and recommendations: Patient; Physician Pain: Pain Assessment Pain Assessment: Faces Faces Pain Scale: 0 End of Session: Start Time:SLP Start Time (ACUTE ONLY): 0843 Stop Time: SLP Stop Time (ACUTE ONLY): 0856 Time Calculation:SLP Time Calculation (min) (ACUTE ONLY): 13 min Charges: SLP Evaluations $ SLP Speech Visit: 1 Visit SLP Evaluations $BSS Swallow: 1 Procedure $MBS Swallow: 1 Procedure SLP visit diagnosis: SLP Visit Diagnosis: Dysphagia, unspecified (R13.10) Past Medical History: Past Medical History: Diagnosis Date  Chronic combined systolic and diastolic congestive heart failure (HCC) 11/09/2021  Generalized anxiety disorder   Hyperlipidemia 11/09/2021  Hypertension   Stage 3b chronic kidney disease (CKD) (HCC) 11/09/2021 Past Surgical History: Past Surgical History: Procedure Laterality Date  AV FISTULA PLACEMENT Left 03/27/2023  Procedure: LEFT ARM BRACHIOCEPHALIC ARTERIOVENOUS (AV) FISTULA CREATION;  Surgeon: [name], MD;  Location: [facility] OR;  Service: Vascular;  Laterality: Left;  CATARACT EXTRACTION, BILATERAL    combined systolic and diastolic congestive heart failure     INSERTION OF DIALYSIS CATHETER N/A 03/27/2023  Procedure: INSERTION OF RIGHT INTERNAL JUGULAR TUNNELED DIALYSIS CATHETER;  Surgeon: [name], MD;  Location: [facility] OR;  Service: Vascular;  Laterality: N/A;  IR THORACENTESIS ASP PLEURAL SPACE W/IMG GUIDE  06/12/2023 Mahala Menghini., M.A. CCC-SLP Acute Rehabilitation Services Office [phone] Secure chat preferred 12/15/2023, 9:23 AM  DG Foot 2 Views Left Result Date:  12/13/2023 CLINICAL DATA:  Left foot ulcer. EXAM: LEFT FOOT - 2 VIEW COMPARISON:  None Available. FINDINGS: Two views are submitted. The foot is rotated on the AP view. The bones are diffusely demineralized. No evidence of acute fracture, dislocation or bone destruction. Mild midfoot and 1st MTP joint degenerative changes. There is diffuse soft tissue swelling without obvious ulceration, foreign body or soft tissue emphysema. Diffuse vascular calcifications are noted. Additional probable calcifications within the plantar fascia. IMPRESSION: Diffuse soft tissue swelling without radiographic evidence of osteomyelitis or foreign body. Electronically Signed   By: Carey Bullocks M.D.   On: 12/13/2023 11:57  CT CHEST ABDOMEN PELVIS WO CONTRAST Result Date: 12/13/2023 CLINICAL DATA:  Mental status change.  Sepsis. EXAM: CT CHEST, ABDOMEN AND PELVIS WITHOUT CONTRAST TECHNIQUE: Multidetector CT imaging of the chest, abdomen and pelvis was performed following the standard protocol without IV contrast. RADIATION DOSE REDUCTION: This exam was performed according to the departmental dose-optimization program which includes automated exposure control, adjustment of the mA and/or kV according to patient size and/or use of iterative reconstruction technique. COMPARISON:  CT chest 03/23/2023.  CT AP 03/08/2021 FINDINGS: CT CHEST FINDINGS Cardiovascular: Mild cardiac enlargement. Aortic atherosclerosis and coronary artery calcifications. No pericardial effusion. Mediastinum/Nodes: Thyroid gland and trachea are unremarkable. Proximal esophagus appears air-filled. No esophageal wall thickening or mass noted. No mediastinal or hilar adenopathy. Lungs/Pleura: Small bilateral pleural effusions are identified. Consolidative changes identified within both lower lobes which in the setting of chronic pleural effusions and septicemia is favored to reflect a combination of chronic rounded atelectasis with possible superimposed multifocal  pneumonia. Overlying the lateral aspect of the lingula there is a subpleural nodule measuring 2.5 x 1.7 cm, image 83/4. Atelectasis and/or consolidative changes were noted in this area on the comparison exam from 03/23/2023. No signs of interstitial edema. Musculoskeletal: No chest wall mass or suspicious bone lesions identified. CT ABDOMEN PELVIS FINDINGS Hepatobiliary: No focal liver abnormality is seen. Tiny stones noted layering within the dependent portion of the gallbladder. No signs of pericholecystic inflammation or bile duct dilatation. Pancreas: Unremarkable. No pancreatic ductal dilatation or surrounding inflammatory changes. Spleen: Normal in size without focal abnormality. Adrenals/Urinary Tract: Normal adrenal glands. Bilateral renal vascular calcifications. No nephrolithiasis, hydronephrosis or mass identified. The urinary bladder appears unremarkable. Stomach/Bowel: Stomach appears normal and nondistended. There is no pathologic dilatation of the large or small bowel loops. Assessment of bowel pathology is limited due to lack of oral and IV contrast material. The appendix is not confidently identified Vascular/Lymphatic: Aortic atherosclerosis. Extensive calcifications involving the upper abdominal vascularity identified. No aneurysm. No adenopathy. There is diffuse hazy edema throughout the mesentery and peritoneal fat likely reflecting anasarca. Reproductive: Retroflexed uterus is identified. As noted on the CT from 03/08/2021 there is an mildly complex cyst within the left adnexa which measures 6.3 x 4.6 cm and 30 Hounsfield units, image 94/3. Unchanged from previous exam. Other: Diffuse hazy edema within the peritoneal and mesenteric fat. No significant free fluid or fluid collections identified. Musculoskeletal: No acute or significant osseous findings. Diffuse body wall edema identified. IMPRESSION: 1. Small bilateral pleural effusions are identified. Consolidative changes identified within  both lower lobes which in the setting of chronic pleural effusions and septicemia may reflect a combination of chronic rounded atelectasis and/or superimposed multifocal pneumonia. 2. Overlying the lateral aspect of the lingula there is a subpleural nodule measuring 2.5 x 1.7 cm. Atelectasis and/or consolidative changes were noted in this area on the comparison exam from 03/23/2023. Follow-up imaging in 1 month following appropriate therapy is advised to assess for temporal change in the appearance of this nodule. If persistent or progressive then further workup to exclude underlying malignancy would be advised. 3. Diffuse hazy edema throughout the mesentery and peritoneal fat and subcutaneous soft tissues of the body wall likely reflecting anasarca. 4. Cholelithiasis. 5. Stable appearance of mildly complex cyst within the left adnexa which measures 6.3 x 4.6 cm and 30 Hounsfield units. Given relative stability of this abnormality this is favored to represent a benign finding. Advise follow-up imaging with nonemergent pelvic sonogram for more definitive characterization. 6. Aortic Atherosclerosis (ICD10-I70.0). Electronically  Signed   By: Signa Kell M.D.   On: 12/13/2023 06:39   CT HEAD WO CONTRAST ( ) Result Date: 12/13/2023 CLINICAL DATA:  Delirium EXAM: CT HEAD WITHOUT CONTRAST TECHNIQUE: Contiguous axial images were obtained from the base of the skull through the vertex without intravenous contrast. RADIATION DOSE REDUCTION: This exam was performed according to the departmental dose-optimization program which includes automated exposure control, adjustment of the mA and/or kV according to patient size and/or use of iterative reconstruction technique. COMPARISON:  12/22/2021 FINDINGS: Brain: No evidence of acute infarction, hemorrhage, hydrocephalus, extra-axial collection or mass lesion/mass effect. Vascular: Atheromatous calcification of the vertebral arteries. Skull: Normal. Negative for fracture or  focal lesion. Sinuses/Orbits: No acute finding. IMPRESSION: No acute or reversible finding. Electronically Signed   By: Tiburcio Pea M.D.   On: 12/13/2023 04:56   DG Chest Portable 1 View Result Date: 12/13/2023 CLINICAL DATA:  Hypoxia  Recent flu; chf; hypoxia EXAM: PORTABLE CHEST 1 VIEW COMPARISON:  CT chest 03/23/2023, chest x-ray 06/12/2023 FINDINGS: The heart and mediastinal contours are unchanged. Atherosclerotic plaque. No focal consolidation. No pulmonary edema. Trace to small loculated right pleural effusion. Small loculated left pleural effusion. No pneumothorax. No acute osseous abnormality. IMPRESSION: 1. Trace to small loculated right pleural effusion. 2. Small loculated left pleural effusion. 3.  Aortic Atherosclerosis (ICD10-I70.0). Electronically Signed   By: Tish Frederickson M.D.   On: 12/13/2023 01:19    Assessment/Plan  Pulmonary Embolism Recent small pulmonary embolism in the right upper lobe confirmed by CT scan. Currently on anticoagulation therapy with Eliquis (apixaban). No chest pain or palpitations reported. Eliquis is expected to help dissolve the clot and prevent further embolic events. - Continue Eliquis (apixaban) 1 tablet twice daily - Order follow-up CT scan in one month to assess pulmonary embolism - Monitor for signs of chest pain or palpitations  Pleural Effusion Small pleural effusion confirmed by CT scan, unchanged from the previous week. Regular dialysis is expected to manage fluid levels and reduce the effusion. - Continue regular dialysis sessions - Monitor for changes in respiratory status  Congestive Heart Failure Congestive heart failure with recent shortness of breath and bilateral ankle edema. BNP level 413 indicates fluid overload. Symptoms improved with dialysis, no current leg swelling. Elevating legs and monitoring fluid intake are important. - Monitor fluid status and symptoms - Elevate legs when sitting or lying down to reduce edema  End  Stage Renal Disease On hemodialysis three times a week. Recent creatinine 1.84, improved from previous levels, GFR 29. Hemoglobin 11.0, platelets low at 61, WBC count low at 2.0, no infection signs. Occasional epistaxis likely due to low platelets and anticoagulation therapy. Monitoring for bleeding is crucial. - Continue hemodialysis on Tuesday, Thursday, and Saturday - Monitor blood work, including CBC, during dialysis - Request dialysis center to send blood work results to avoid repeat testing - Monitor for signs of bleeding, especially epistaxis  Hypertension Hypertension management is part of ongoing care.  Chronic Wound on Left Heel 1.5 cm wound on posterior left leg, likely from recliner friction. No erythema or warmth. Managed with cream and protective measures. Home health nurse planned for wound care. Elevating heel off the bed is important. - Apply cream to the wound and use protective measures - Order home health nurse for wound care management - Use foam dressing to absorb drainage and protect the wound - Change dressing every three days unless soiled - Elevate heel off the bed to reduce pressure  Severe protein Malnutrition  Underweight at 106 lbs. Need to increase protein intake for weight gain and health. Hesitant to use protein supplements due to gastrointestinal side effects. Encouraging protein-rich foods and considering reintroducing supplements if tolerated is important. - Encourage protein-rich foods such as eggs and cheese - Consider reintroducing protein supplements if tolerated  Follow-up Follow-up appointments scheduled for heart and foot health, and a Medicare visit. Coordination of appointments is important for comprehensive care. - Attend appointment with [facility] on March 25 at 1:15 PM - Attend echocardiogram appointment on April 15 at 1 PM - Attend Medicare visit on April 21 at 1 PM - Schedule follow-up CT scan in one month Arrange outpatient physical  therapy referral to [facility]    Family/ staff Communication: Reviewed plan of care with patient verbalized understanding   Labs/tests ordered: Chest CT scan   Next Appointment: Return in about 6 months (around 07/01/2024) for medical mangement of chronic issues..   Total time: 45 minutes. Greater than 50% of total time spent doing patient education regarding Hospitalization follow up,health maintenance including symptom/medication management.   Caesar Bookman, NP

## 2024-01-11 NOTE — Progress Notes (Signed)
 Responded to a CODE BLUE page with Dr. Allena Katz.  Upon my arrival, the patient was alert, hemodynamically stable, and appeared back at her baseline.  Per the bedside nurse, the patient was found to be unresponsive and a glucose was checked, which was in the 40s. She was given dextrose with rapid recovery. Observed droop of left side of mouth, which daughter confirmed was normal for her. She had a reassuring neuro exam. ACLS was not done given the identified cause of her unresponsiveness.

## 2024-01-11 NOTE — Assessment & Plan Note (Addendum)
 No frank volume overload, no hyperkalemia, acidosis or uremia.  Will consult nephrology for routine HD She is very deconditioned and has lost significant body weight, may have to modify dry weight.   Metabolic bone disease continue with sevelamer.

## 2024-01-11 NOTE — ED Triage Notes (Signed)
 The pt was dialyzed  yesterday  the pt awakened early this am saying that she could not breathe.  She was diagnosed with a pe 2-3 weeks ago and was placed on eliquist  lt arm dialysis graft

## 2024-01-11 NOTE — Assessment & Plan Note (Signed)
 Not able to stage. Present on admission  Consult wound care team.

## 2024-01-11 NOTE — H&P (Addendum)
 History and Physical    Patient: Kara Mullins XLK:[id number] DOB: [birth year] DOA: 01/11/2024 DOS: the patient was seen and examined on 01/11/2024 PCP: NgetichDonalee Citrin, NP  Patient coming from: Home  Chief Complaint:  Chief Complaint  Patient presents with   Shortness of Breath   HPI: Kara Mullins is a 73 y.o. female with medical history significant of ESRD on HD, heart failure, hypertension and hyperlipidemia who presented with dyspnea and failure to thrive.  She lives with her daughter who is her caregiver. Patient was recently hospitalized 03/1 to 12/16/23 for influenza A infection with bacterial pneumonia superinfection. She was treated with oseltamivir and antibiotic therapy.  Patient at home continue to be very weak and deconditioned. Very poor oral intake, progressive less mobility and worsening dyspnea on exertion.  She has been using a wheelchair for over one year.   03/08 ED visit for dyspnea, CT chest was done, and resulted positive for pulmonary embolism, subsegmental right upper lobe. Small thromboembolic burden. Placed on apixaban for anticoagulation,   03/11 Cardiology follow up recommended to continue furosemide on non HD days.    At home her daughter noted patient's blood pressure bing low over the last 6 days, on 03/25 HD her daughter was told that her mother's blood pressure was not low. Yesterday patient had dialysis, when reached home she continue very weak and deconditioned. This morning she had severe dyspnea prompting her daughter to bring her back to the ED.  No apparent fever or chills, no coughing or wheezing.   All information from her daughter at the bedside, patient not able to give detailed history due to somnolence.   Review of Systems: unable to review all systems due to the inability of the patient to answer questions. Past Medical History:  Diagnosis Date   Chronic combined systolic and diastolic congestive heart failure (HCC) 11/09/2021    Generalized anxiety disorder    Hyperlipidemia 11/09/2021   Hypertension    Stage 3b chronic kidney disease (CKD) (HCC) 11/09/2021   Past Surgical History:  Procedure Laterality Date   AV FISTULA PLACEMENT Left 03/27/2023   Procedure: LEFT ARM BRACHIOCEPHALIC ARTERIOVENOUS (AV) FISTULA CREATION;  Surgeon: [name], MD;  Location: [facility] OR;  Service: Vascular;  Laterality: Left;   CATARACT EXTRACTION, BILATERAL     combined systolic and diastolic congestive heart failure      INSERTION OF DIALYSIS CATHETER N/A 03/27/2023   Procedure: INSERTION OF RIGHT INTERNAL JUGULAR TUNNELED DIALYSIS CATHETER;  Surgeon: [name], MD;  Location: [facility] OR;  Service: Vascular;  Laterality: N/A;   IR THORACENTESIS ASP PLEURAL SPACE W/IMG GUIDE  06/12/2023   Social History:  reports that she has never smoked. She has never used smokeless tobacco. She reports that she does not currently use alcohol. She reports that she does not use drugs.  Allergies  Allergen Reactions   Crestor [Rosuvastatin] Shortness Of Breath   Nitrofuran Derivatives Nausea Only    Family History  Problem Relation Age of Onset   Throat cancer Father     Prior to Admission medications   Medication Sig Start Date End Date Taking? Authorizing Provider  APIXABAN (ELIQUIS) VTE STARTER PACK (10MG  AND 5MG ) Take as directed on package: start with two-5mg  tablets twice daily for 7 days. On day 8, switch to one-5mg  tablet twice daily. 12/20/23  Yes [name], [name], DO  carvedilol (COREG) 3.125 MG tablet Take 3.125 mg by mouth 2 (two) times daily.   Yes [provider]  furosemide (  LASIX) 80 MG tablet Take 1 tablet (80 mg total) by mouth every Monday, Wednesday, Friday and Sunday. 11/20/23  Yes [name], [name], NP  lidocaine-prilocaine (EMLA) cream APPLY 1 APPLICATION TOPICALLY AS NEEDED.(USE 30 MINUTES PRIOR TO DIALYSIS). 12/29/23  Yes [name], [name], NP  Menthol-Methyl Salicylate (MUSCLE RUB) 10-15 % CREA Apply 1  Application topically daily as needed for muscle pain.   Yes [provider]  multivitamin (RENA-VIT) TABS tablet Take 1 tablet by mouth daily. Patient taking differently: Take 1 tablet by mouth See admin instructions. Take 1 tablet by mouth every Mon, Wed, Fri, Sat 06/19/23  Yes [name], [name], NP  OVER THE COUNTER MEDICATION Take 1 fluid ounce by mouth daily. Liquid Cell Protein   Yes [provider]  sevelamer carbonate (RENVELA) 800 MG tablet Take 1 tablet (800 mg total) by mouth 3 (three) times daily with meals. 08/29/23  Yes [name], [name], NP  silver sulfADIAZINE (SILVADENE) 1 % cream Apply 1 Application topically daily. 12/02/23  Yes [name], [name]  fluticasone (FLONASE) 50 MCG/ACT nasal spray Place 2 sprays into both nostrils daily. Patient not taking: Reported on 01/11/2024 12/09/23   [name], [name]  isosorbide mononitrate (IMDUR) 30 MG 24 hr tablet Take 0.5 tablets (15 mg total) by mouth daily. Patient not taking: Reported on 01/11/2024 12/23/23   [name], MD    Physical Exam: Vitals:   01/11/24 0600 01/11/24 0615 01/11/24 0630 01/11/24 0645  BP: (!) 97/51 (!) 106/46 (!) 108/46 110/61  Pulse: 60  63   Resp: 14 18 15 20   Temp:    (!) 95.2 F (35.1 C)  TempSrc:    Oral  SpO2: 97% 100% 98% 100%  Weight:      Height:       Neurology somnolent, easy to arouse but she falls to sleep very fast.  ENT with mild pallor with dry mucous membranes  Cardiovascular with S1 and S2 present and regular with no gallops or rubs, positive systolic murmur at the apex No JVD Respiratory with scattered bilateral rhonchi with no wheezing, mild rales at bases Abdomen with no distention  Lower extremity edema non pitting.  Left heal ulcerated wound, not able to stage about 1,5 cm diameter. Crust in place with no local edema or erythema.     Data Reviewed:   Na 131, K 3,4 Cl 98, bicarbonate 25 glucose 77 bun 10 cr 1,48  AST 28 ALT 18  High  sensitive troponin 3 and 2 Wbc 4,0 hgb 10, plt 33   CT chest with bronchial airway filling the lung bases, possible aspiration. Pulmonary embolism seen in the right upper lobe on prior CT has resolved.  Cardiomegaly with body wall edema.   Chest radiograph with hyperinflation, cardiomegaly with bilateral pleural effusions more right than left, opacity left lower lobe.   EKG 59 bpm, right axis deviation, normal intervals, qtc 445, sinus rhythm with no significant ST segment or T wave changes. Positive LVH.    Assessment and Plan: * Aspiration pneumonia of left lower lobe (HCC) Patient very weak and deconditioned.   Chest CT with left lower lobe infiltrate suggestive aspiration,  Plan to continue aspiration precautions Consult speech therapy.  Start Unasy IV for antibiotic therapy.  Patient very weak and deconditioned with very poor prognosis.   Hypoglycemia Patient had hypoglycemia episode, triggering bradycardia and less responsiveness Code blue was called, she received 2 chest compression, for weak pulse, no fran cardiac arrest.  She  received D50 with improvement in her hemodynamics.   Plan to continue close monitoring of capillary glucose Will continue a slow rate of NS with D5% at 50 ml per hr. Patient with very poor prognosis with poor oral intake.   ESRD on hemodialysis (HCC) No frank volume overload, no hyperkalemia, acidosis or uremia.  Will consult nephrology for routine HD She is very deconditioned and has lost significant body weight, may have to modify dry weight.   Metabolic bone disease continue with sevelamer.   Chronic diastolic CHF (congestive heart failure) (HCC) No signs of acute exacerbation.  Continue blood pressure monitoring.  Hold on antihypertensive medications due to risk of hypotension.  If blood pressure stable may furosemide po to avoid volume overload.   Severe protein-calorie malnutrition (HCC) Consult nutrition, her po intake has  significantly decreased.  Very poor prognosis.   Essential hypertension Hypotension on this admission.  Continue close monitoring, if persistent will add midodrine.   Heel ulcer (HCC) Not able to stage. Present on admission  Consult wound care team.     Advance Care Planning:   Code Status: Full Code   Consults: Nephrology for HD   Family Communication: I spoke with patient's daughter at the bedside, we talked in detail about patient's condition, plan of care and prognosis and all questions were addressed.   Severity of Illness: The appropriate patient status for this patient is INPATIENT. Inpatient status is judged to be reasonable and necessary in order to provide the required intensity of service to ensure the patient's safety. The patient's presenting symptoms, physical exam findings, and initial radiographic and laboratory data in the context of their chronic comorbidities is felt to place them at high risk for further clinical deterioration. Furthermore, it is not anticipated that the patient will be medically stable for discharge from the hospital within 2 midnights of admission.   * I certify that at the point of admission it is my clinical judgment that the patient will require inpatient hospital care spanning beyond 2 midnights from the point of admission due to high intensity of service, high risk for further deterioration and high frequency of surveillance required.*  Author: [name], MD 01/11/2024 7:44 AM  For on call review [email or web address].

## 2024-01-11 NOTE — Consult Note (Addendum)
 [facility] Renal Consultation Note    Indication for Consultation:  Management of ESRD/hemodialysis; anemia, hypertension/volume and secondary hyperparathyroidism PCP: [name] NP Nephrologist: Dr. [name]  HPI: Kara Mullins is a 73 y.o. female with ESRD on hemodialysis T,Th, S at [facility] PMH: Chronic combined systolic & diastolic HF, HTN, lymphedema, GAD, PNA. Recent admission 03/01-03/01/2024 for influenza A/bacterial PNA, ultrasound incidental finding of left complex adnexa cyst.   Patient presented to ED today as daughter reported severe dyspnea at home. She was somnolent upon arrival to ED. T-95.9 BP 75/41 HR 65. WBC 4.0 HGB 10.0 PLT 33 Na 131 K+ 3.4 SCr 1.48 BUN 10 CO2 25. CT angio Bronchial airway filling the lung bases since recent comparison with pneumonia superimposed on chronic lung disease at the left base. Question interval aspiration. Noted ulcer L heel. She has been admitted for aspiration PNA LLL. She rec'd 1 liter IVF in ED for hypotension.   Seen being transport from 6E to 43M. Arrived to unit, hypothermic, hypotensive. Brief episode of PEA Arrest with chest compression. Patient obtunded when first seen but upon arrival to MICU, she is now awake, alert and taking. O2 Sats 97% on RA. BP 97/50 MAP 65. She denies SOB at present.    Past Medical History:  Diagnosis Date   Chronic combined systolic and diastolic congestive heart failure (HCC) 11/09/2021   Generalized anxiety disorder    Hyperlipidemia 11/09/2021   Hypertension    Stage 3b chronic kidney disease (CKD) (HCC) 11/09/2021   Past Surgical History:  Procedure Laterality Date   AV FISTULA PLACEMENT Left 03/27/2023   Procedure: LEFT ARM BRACHIOCEPHALIC ARTERIOVENOUS (AV) FISTULA CREATION;  Surgeon: [name], MD;  Location: [facility] OR;  Service: Vascular;  Laterality: Left;   CATARACT EXTRACTION, BILATERAL     combined systolic and diastolic congestive heart failure       INSERTION OF DIALYSIS CATHETER N/A 03/27/2023   Procedure: INSERTION OF RIGHT INTERNAL JUGULAR TUNNELED DIALYSIS CATHETER;  Surgeon: [name], MD;  Location: [facility] OR;  Service: Vascular;  Laterality: N/A;   IR THORACENTESIS ASP PLEURAL SPACE W/IMG GUIDE  06/12/2023   Family History  Problem Relation Age of Onset   Throat cancer Father    Social History:  reports that she has never smoked. She has never used smokeless tobacco. She reports that she does not currently use alcohol. She reports that she does not use drugs. Allergies  Allergen Reactions   Crestor [Rosuvastatin] Shortness Of Breath   Nitrofuran Derivatives Nausea Only   Prior to Admission medications   Medication Sig Start Date End Date Taking? Authorizing Provider  APIXABAN [name] Take as directed on package: start with two-5mg  tablets twice daily for 7 days. On day 8, switch to one-5mg  tablet twice daily. 12/20/23  Yes [name], [name], DO  carvedilol (COREG) 3.125 MG tablet Take 3.125 mg by mouth 2 (two) times daily.   Yes [provider]  furosemide (LASIX) 80 MG tablet Take 1 tablet (80 mg total) by mouth every Monday, Wednesday, Friday and Sunday. 11/20/23  Yes [name], [name], NP  lidocaine-prilocaine (EMLA) cream APPLY 1 APPLICATION TOPICALLY AS NEEDED.(USE 30 MINUTES PRIOR TO DIALYSIS). 12/29/23  Yes [name], [name], NP  Menthol-Methyl Salicylate (MUSCLE RUB) 10-15 % CREA Apply 1 Application topically daily as needed for muscle pain.   Yes [provider]  multivitamin (RENA-VIT) TABS tablet Take 1 tablet by mouth daily. Patient taking differently: Take  1 tablet by mouth See admin instructions. Take 1 tablet by mouth every Mon, Wed, Fri, Sat 06/19/23  Yes [name], [name], NP  OVER THE COUNTER MEDICATION Take 1 fluid ounce by mouth daily. Liquid Cell Protein   Yes [provider]  sevelamer carbonate (RENVELA) 800 MG tablet Take 1 tablet (800 mg total) by  mouth 3 (three) times daily with meals. 08/29/23  Yes [name], [name], NP  silver sulfADIAZINE (SILVADENE) 1 % cream Apply 1 Application topically daily. 12/02/23  Yes [name], [name]  fluticasone (FLONASE) 50 MCG/ACT nasal spray Place 2 sprays into both nostrils daily. Patient not taking: Reported on 01/11/2024 12/09/23   [name], [name]  isosorbide mononitrate (IMDUR) 30 MG 24 hr tablet Take 0.5 tablets (15 mg total) by mouth daily. Patient not taking: Reported on 01/11/2024 12/23/23   [name], MD   Current Facility-Administered Medications  Medication Dose Route Frequency Provider Last Rate Last Admin   0.9 %  sodium chloride infusion  250 mL Intravenous Continuous [name], [name], MD       acetaminophen (TYLENOL) tablet 650 mg  650 mg Oral Q6H PRN [name], [name], MD       Or   acetaminophen (TYLENOL) suppository 650 mg  650 mg Rectal Q6H PRN [name], [name], MD       Ampicillin-Sulbactam (UNASYN) 3 g in sodium chloride 0.9 % 100 mL IVPB  3 g Intravenous Once [name], MD 200 mL/hr at 01/11/24 1429 3 g at 01/11/24 1429   [START ON 01/12/2024] Ampicillin-Sulbactam (UNASYN) 3 g in sodium chloride 0.9 % 100 mL IVPB  3 g Intravenous Q24H [name], [name], MD       apixaban [name] tablet 5 mg  5 mg Oral BID [name], [name], MD   5 mg at 01/11/24 1013   dextrose 5 %-0.9 % sodium chloride infusion   Intravenous Continuous [name], [name], MD 50 mL/hr at 01/11/24 1323 New Bag at 01/11/24 1323   dextrose 50 % solution 12.5 g  12.5 g Intravenous STAT [name], [name], MD       dextrose 50 % solution 25 g  25 g Intravenous STAT [name], [name], MD       leptospermum manuka honey (MEDIHONEY) paste 1 Application  1 Application Topical Daily [name], [name], MD       Melene Muller ON 01/12/2024] multivitamin (RENA-VIT) tablet 1 tablet  1 tablet Oral Q M,W,F,Sa-1800 [name], [name], MD        norepinephrine (LEVOPHED) 4mg  in (0.016 mg/mL) premix infusion  2-10 mcg/min Intravenous Titrated [name], [name], MD       [START ON 01/12/2024] pneumococcal 20-valent conjugate vaccine (PREVNAR 20) injection 0.5 mL  0.5 mL Intramuscular Tomorrow-1000 [name], [name], MD       sevelamer carbonate (RENVELA) tablet 800 mg  800 mg Oral TID WC [name], [name], MD   800 mg at 01/11/24 1229   Labs: Basic Metabolic Panel: Recent Labs  Lab 01/11/24 0228  NA 131*  K 3.4*  CL 98  CO2 25  GLUCOSE 77  BUN 10  CREATININE 1.48*  CALCIUM 8.5*   Liver Function Tests: Recent Labs  Lab 01/11/24 0228  AST 28  ALT 18  ALKPHOS 69  BILITOT 0.6  PROT 7.1  ALBUMIN 2.5*   Recent Labs  Lab 01/11/24 0228  LIPASE 29   No results for input(s): "AMMONIA" in the last 168 hours. CBC: Recent  Labs  Lab 01/11/24 0228  WBC 4.0  HGB 10.0*  HCT 32.8*  MCV 92.4  PLT 33*   Cardiac Enzymes: No results for input(s): "CKTOTAL", "CKMB", "CKMBINDEX", "TROPONINI" in the last 168 hours. CBG: Recent Labs  Lab 01/11/24 1135 01/11/24 1159 01/11/24 1249 01/11/24 1405  GLUCAP 46* 156* 110* 95   Iron Studies: No results for input(s): "IRON", "TIBC", "TRANSFERRIN", "FERRITIN" in the last 72 hours. Studies/Results: CT Angio Chest PE W and/or Wo Contrast Result Date: 01/11/2024 CLINICAL DATA:  Shortness of breath EXAM: CT ANGIOGRAPHY CHEST WITH CONTRAST TECHNIQUE: Multidetector CT imaging of the chest was performed using the standard protocol during bolus administration of intravenous contrast. Multiplanar CT image reconstructions and MIPs were obtained to evaluate the vascular anatomy. RADIATION DOSE REDUCTION: This exam was performed according to the departmental dose-optimization program which includes automated exposure control, adjustment of the mA and/or kV according to patient size and/or use of iterative reconstruction technique. CONTRAST:  75mL OMNIPAQUE IOHEXOL 350 MG/ML  SOLN COMPARISON:  12/20/2023 FINDINGS: Cardiovascular: Satisfactory opacification of the pulmonary arteries to the segmental level. No evidence of pulmonary embolism, right upper lobe embolism on prior is resolved. Enlarged heart size. No pericardial effusion. Extensive atheromatous calcification of the aorta and coronaries. Mediastinum/Nodes: Negative for mass or adenopathy. Upper esophageal fluid level. Lungs/Pleura: There is chronic opacity in the subpleural lower lobes and lingula, worsening opacification at both lung bases, especially in the left lower lobe where there is now multifocal bronchial opacification and obstruction. Continued rounded atelectasis appearance to the lingular opacity and right lower lobe opacity. Upper Abdomen: Extensive arterial calcification.  No acute finding Musculoskeletal: Body wall edema. Exaggerated thoracic kyphosis and mild scoliosis. Review of the MIP images confirms the above findings. IMPRESSION: 1. Bronchial airway filling the lung bases since recent comparison with pneumonia superimposed on chronic lung disease at the left base. Question interval aspiration. 2. Pulmonary embolism seen in the right upper lobe on prior has resolved. No interval pulmonary embolism. 3. Cardiomegaly and body wall edema. Electronically Signed   By: Tiburcio Pea M.D.   On: 01/11/2024 05:14   DG Chest Portable 1 View Result Date: 01/11/2024 CLINICAL DATA:  Shortness of breath.  Dialysis.  Recent PE. EXAM: PORTABLE CHEST 1 VIEW COMPARISON:  Radiographs and CT from 12/20/2023 FINDINGS: Stable cardiomegaly. Aortic atherosclerotic calcification. Small to moderate left greater than right pleural effusions and associated airspace opacities. Pulmonary vascular congestion. No pneumothorax. No displaced rib fractures. IMPRESSION: Small moderate left greater than right pleural effusions similar to 12/20/2023. Basilar airspace opacities may be due to atelectasis or pneumonia. Electronically Signed    By: Minerva Fester M.D.   On: 01/11/2024 02:39    ROS: As per HPI otherwise negative.   Physical Exam: Vitals:   01/11/24 1408 01/11/24 1420 01/11/24 1432 01/11/24 1435  BP: (!) 75/41 (!) 68/42 (!) 76/46 (!) 85/50  Pulse:  65 64 66  Resp:  19 15 19   Temp: (!) 95.9 F (35.5 C)     TempSrc: Rectal     SpO2:  96% 97% 98%  Weight:      Height:         General: Frail elderly female, in no acute distress. Head: Normocephalic, atraumatic, sclera non-icteric, mucus membranes are moist Neck: Supple. JVD not elevated. Lungs: Clear bilaterally to auscultation without wheezes, rales, or rhonchi. Breathing is unlabored. Heart: S1, S2 2/6 systolic M. No R/G Abdomen: NABS, NT Lower extremities: Wound L heel not seen-pressure boots in place. No LE  edema Neuro: Alert and oriented X 2 Psych:  Responds to questions appropriately with a normal affect. Dialysis Access: L AVF + T/B  Dialysis Orders: [place] T,Th,S 3:45 HR 180NRe 350/Autoflow 1.5 45.5 kg 3.0 K/3.0 Ca AVF - Heparin 4000 units IV three times per week - Mircera 30 mcg IV Q 4 weeks (last dose 12/30/2023)   Assessment/Plan:  Aspiration PNA LLL-per primary  Brief PEA Arrest-short round of chest compressions. Not intubated. Transferring to MICU.   Shock-W/U per primary  ESRD -  T,Th, S-next HD 01/13/2024  Hypertension/volume  - BP soft. No excess volume by exam. Pleural effusion on CXR. UF as tolerated.   Anemia  - HGB 10.0. Follow HGB.   Metabolic bone disease -  Resume binders when pt is able to eat.   Nutrition - Very low albumin. Protein supplements Combined systolic/diastolic HF-appear euvolemic at present. Optimize volume with HD.  H/O PE on apixaban Wound L heel-WOC following  Rita H. Manson Passey, NP-C 01/11/2024, 2:53 PM  [facility] Beeper [phone]  Nephrology attending: I have personally seen and examined the patient.  Chart reviewed and I agree with the assessment and plan as outlined  above.  73 year old female ESRD on HD presented with pneumonia, had brief PEA arrest today, transferring to ICU for shock.  No need for dialysis today.  Will assess in the morning for CRRT versus dialysis need.  Regular dialysis schedule will be on next Tuesday.  Discussed with the patient's nurse as well as her daughter.  Crista Elliot, [facility].

## 2024-01-11 NOTE — Progress Notes (Signed)
 Pharmacy Antibiotic Note  Kara Mullins is a 73 y.o. female admitted on 01/11/2024 with  aspiration pneumonia .  Pharmacy has been consulted for Unasyn dosing.   MD now adding Vancomycin for sepsis. Temp 95.9   Plan: Unasyn 3g IV every 24 hours Vancomycin 1000mg  IV now then 500mg  IV qHD Monitor dialysis schedule, imaging, CBC, cultures, and signs of clinical improvement  Height: 5\' 5"  (165.1 cm) Weight: 51 kg (112 lb 7 oz) IBW/kg (Calculated) : 57  Temp (24hrs), Avg:95.3 F (35.2 C), Min:94.3 F (34.6 C), Max:97.2 F (36.2 C)  Recent Labs  Lab 01/11/24 0228  WBC 4.0  CREATININE 1.48*    Estimated Creatinine Clearance: 28.1 mL/min (A) (by C-G formula based on SCr of 1.48 mg/dL (H)).    Allergies  Allergen Reactions   Crestor [Rosuvastatin] Shortness Of Breath   Nitrofuran Derivatives Nausea Only    Antimicrobials this admission: 3/30 Unasyn >>  3/30 Vanc>>  Thank you for allowing pharmacy to be a part of this patient's care.  Christoper Fabian, PharmD, BCPS Please see amion for complete clinical pharmacist phone list 01/11/2024 3:44 PM

## 2024-01-11 NOTE — Progress Notes (Signed)
   01/11/24 1205  Spiritual Encounters  Type of Visit Initial  Care provided to: Pt and family  Conversation partners present during encounter Nurse  Referral source Code page  Reason for visit Code  OnCall Visit Yes   Chaplain responded to Code Blue. Pt was revived and Chaplain provided spiritual care to Pt and her daughter and later to Pt's husband. No further spiritual need at this time.  Chaplain Daron Offer

## 2024-01-11 NOTE — ED Notes (Signed)
 Unable to do EKG, Triage RN aware

## 2024-01-11 NOTE — Progress Notes (Signed)
   01/11/24 0852  Assess: MEWS Score  Temp (!) 94.6 F (34.8 C)  BP (!) 90/59  MAP (mmHg) 70  Pulse Rate 61  ECG Heart Rate 62  Resp 16  Level of Consciousness Alert  SpO2 97 %  O2 Device Room Air  Assess: MEWS Score  MEWS Temp 2  MEWS Systolic 1  MEWS Pulse 0  MEWS RR 0  MEWS LOC 0  MEWS Score 3  MEWS Score Color Yellow  Assess: if the MEWS score is Yellow or Red  Were vital signs accurate and taken at a resting state? Yes  Does the patient meet 2 or more of the SIRS criteria? No  MEWS guidelines implemented  Yes, yellow  Treat  MEWS Interventions Considered administering scheduled or prn medications/treatments as ordered  Take Vital Signs  Increase Vital Sign Frequency  Yellow: Q2hr x1, continue Q4hrs until patient remains green for 12hrs  Escalate  MEWS: Escalate Yellow: Discuss with charge nurse and consider notifying provider and/or RRT  Notify: Charge Nurse/RN  Name of Charge Nurse/RN Notified Joyce Copa, RN  Assess: SIRS CRITERIA  SIRS Temperature  1  SIRS Respirations  0  SIRS Pulse 0  SIRS WBC 0  SIRS Score Sum  1

## 2024-01-11 NOTE — Assessment & Plan Note (Signed)
 Consult nutrition, her po intake has significantly decreased.  Very poor prognosis.

## 2024-01-11 NOTE — IPAL (Signed)
 I spoke with patient's POA, (daughter) and patient about current condition. Kara Mullins is very weak and deconditioned, having episodes of hypoglycemia, with severe symptoms.  She has poor prognosis and high risk for worsening.   Patient's family would like to continue full code.  Palliative care will be consulted.

## 2024-01-11 NOTE — Evaluation (Signed)
 SLP Cancellation Note  Patient Details Name: Jaya Lapka MRN: [id number] DOB: 05-09-1951   Cancelled treatment:       Reason Eval/Treat Not Completed: Other (comment) (Per RN, pt swallowed her medications fine this am, but they are now getting ready to transfer her to ICU. Will continue efforts.)  Rolena Infante, MS [facility] SLP Acute Rehab Services Office [phone]  Chales Abrahams 01/11/2024, 3:00 PM

## 2024-01-11 NOTE — Progress Notes (Addendum)
 RN heard alarm in room, O2 70% on RA. RN grabbed [place] from supply room - upon arrival HR 25 and pt unresponsive. RN did not confidently feel a pulse. Pt placed on 4L [place], called code blue, and charge RN started 2 compressions before pt responded. CBG 46, 1 amp D50 given. Recheck 156. MD [name] notified.

## 2024-01-11 NOTE — Assessment & Plan Note (Signed)
 Patient had hypoglycemia episode, triggering bradycardia and less responsiveness Code blue was called, she received 2 chest compression, for weak pulse, no fran cardiac arrest.  She received D50 with improvement in her hemodynamics.   Plan to continue close monitoring of capillary glucose Will continue a slow rate of NS with D5% at 50 ml per hr. Patient with very poor prognosis with poor oral intake.

## 2024-01-11 NOTE — Progress Notes (Signed)
   01/11/24 1420  Assess: MEWS Score  BP (!) 68/42  MAP (mmHg) (!) 51  Pulse Rate 65  ECG Heart Rate 65  Resp 19  SpO2 96 %  Assess: MEWS Score  MEWS Temp 1  MEWS Systolic 3  MEWS Pulse 0  MEWS RR 0  MEWS LOC 0  MEWS Score 4  MEWS Score Color Red  Assess: if the MEWS score is Yellow or Red  Were vital signs accurate and taken at a resting state? Yes  Does the patient meet 2 or more of the SIRS criteria? No  MEWS guidelines implemented  Yes, red  Treat  MEWS Interventions Considered administering scheduled or prn medications/treatments as ordered  Take Vital Signs  Increase Vital Sign Frequency  Red: Q1hr x2, continue Q4hrs until patient remains green for 12hrs  Escalate  MEWS: Escalate Red: Discuss with charge nurse and notify provider. Consider notifying RRT. If remains red for 2 hours consider need for higher level of care  Notify: Charge Nurse/RN  Name of Charge Nurse/RN Notified Jessica, RN  Assess: SIRS CRITERIA  SIRS Temperature  1  SIRS Respirations  0  SIRS Pulse 0  SIRS WBC 0  SIRS Score Sum  1

## 2024-01-11 NOTE — Progress Notes (Signed)
 eLink Physician-Brief Progress Note Patient Name: Kara Mullins DOB: March 22, 1951 MRN: [id number]   Date of Service  01/11/2024  HPI/Events of Note  Hypoglycemia.  Patient is not eating and has D5 NS running at 50 mL/h.  Blood glucose was 64.  Treated with 50% glucose.  eICU Interventions  Fluids have been changed from D5 NS to D10 at the same rate.  CBGs have been changed from ACHS to every 4 hours. Plan discussed in detail with bedside RN.     Intervention Category Intermediate Interventions: Other: (Hypoglycemia)  Carilyn Goodpasture 01/11/2024, 9:45 PM

## 2024-01-11 NOTE — Assessment & Plan Note (Addendum)
 No signs of acute exacerbation.  Continue blood pressure monitoring.  Hold on antihypertensive medications due to risk of hypotension.  If blood pressure stable may furosemide po to avoid volume overload.

## 2024-01-11 NOTE — Progress Notes (Signed)
 Date and time results received: 01/11/24 9:30 PM  (use smartphrase ".now" to insert current time)  Test: CBG Critical Value: 64; then 118  Name of Provider Notified: E-link  Orders Received? Or Actions Taken?: Actions Taken: 1/2 amp D50 given, then CBG checked 15 minutes later

## 2024-01-11 NOTE — Assessment & Plan Note (Signed)
 Hypotension on this admission.  Continue close monitoring, if persistent will add midodrine.

## 2024-01-11 NOTE — ED Provider Notes (Signed)
 [place] EMERGENCY DEPARTMENT AT [facility] Provider Note   CSN: [id number] Arrival date & time: 01/11/24  0155     History {Add pertinent medical, surgical, social history, OB history to HPI:1} Chief Complaint  Patient presents with   Shortness of Breath    Staci Dack is a 73 y.o. female.   Shortness of Breath      Home Medications Prior to Admission medications   Medication Sig Start Date End Date Taking? Authorizing Provider  APIXABAN (ELIQUIS) VTE STARTER PACK (10MG  AND 5MG ) Take as directed on package: start with two-5mg  tablets twice daily for 7 days. On day 8, switch to one-5mg  tablet twice daily. 12/20/23  Yes [name], [name], DO  carvedilol (COREG) 3.125 MG tablet Take 3.125 mg by mouth 2 (two) times daily.   Yes [provider]  furosemide (LASIX) 80 MG tablet Take 1 tablet (80 mg total) by mouth every Monday, Wednesday, Friday and Sunday. 11/20/23  Yes [name], [name], NP  lidocaine-prilocaine (EMLA) cream APPLY 1 APPLICATION TOPICALLY AS NEEDED.(USE 30 MINUTES PRIOR TO DIALYSIS). 12/29/23  Yes [name], [name], NP  Menthol-Methyl Salicylate (MUSCLE RUB) 10-15 % CREA Apply 1 Application topically daily as needed for muscle pain.   Yes [provider]  multivitamin (RENA-VIT) TABS tablet Take 1 tablet by mouth daily. Patient taking differently: Take 1 tablet by mouth See admin instructions. Take 1 tablet by mouth every Mon, Wed, Fri, Sat 06/19/23  Yes [name], [name], NP  OVER THE COUNTER MEDICATION Take 1 fluid ounce by mouth daily. Liquid Cell Protein   Yes [provider]  sevelamer carbonate (RENVELA) 800 MG tablet Take 1 tablet (800 mg total) by mouth 3 (three) times daily with meals. 08/29/23  Yes [name], [name], NP  silver sulfADIAZINE (SILVADENE) 1 % cream Apply 1 Application topically daily. 12/02/23  Yes [name], [name]  fluticasone (FLONASE) 50 MCG/ACT nasal spray Place 2 sprays into both nostrils  daily. Patient not taking: Reported on 01/11/2024 12/09/23   [name], [name]  isosorbide mononitrate (IMDUR) 30 MG 24 hr tablet Take 0.5 tablets (15 mg total) by mouth daily. Patient not taking: Reported on 01/11/2024 12/23/23   [name], MD      Allergies    Crestor [rosuvastatin] and Nitrofuran derivatives    Review of Systems   Review of Systems  Respiratory:  Positive for shortness of breath.     Physical Exam Updated Vital Signs BP 110/61   Pulse 63   Temp (!) 95.2 F (35.1 C) (Oral)   Resp 20   Ht 5\' 5"  (1.651 m)   Wt 48.1 kg   SpO2 100%   BMI 17.65 kg/m  Physical Exam  ED Results / Procedures / Treatments   Labs (all labs ordered are listed, but only abnormal results are displayed) Labs Reviewed  COMPREHENSIVE METABOLIC PANEL WITH GFR - Abnormal; Notable for the following components:      Result Value   Sodium 131 (*)    Potassium 3.4 (*)    Creatinine, Ser 1.48 (*)    Calcium 8.5 (*)    Albumin 2.5 (*)    GFR, Estimated 38 (*)    All other components within normal limits  CBC - Abnormal; Notable for the following components:   RBC 3.55 (*)    Hemoglobin 10.0 (*)    HCT 32.8 (*)    RDW 16.4 (*)    Platelets 33 (*)    nRBC 0.5 (*)  All other components within normal limits  PROTIME-INR - Abnormal; Notable for the following components:   Prothrombin Time 17.2 (*)    INR 1.4 (*)    All other components within normal limits  LIPASE, BLOOD  TROPONIN I (HIGH SENSITIVITY)  TROPONIN I (HIGH SENSITIVITY)    EKG EKG Interpretation Date/Time:  Sunday January 11 2024 03:32:09 EDT Ventricular Rate:  59 PR Interval:  190 QRS Duration:  151 QT Interval:  449 QTC Calculation: 445 R Axis:   117  Text Interpretation: Sinus rhythm Atrial premature complex Consider left ventricular hypertrophy Probable lateral infarct, age indeterminate Borderline T abnormalities, lateral leads Confirmed by Marily Memos [phone]) on 01/11/2024 3:54:59  AM  Radiology CT Angio Chest PE W and/or Wo Contrast Result Date: 01/11/2024 CLINICAL DATA:  Shortness of breath EXAM: CT ANGIOGRAPHY CHEST WITH CONTRAST TECHNIQUE: Multidetector CT imaging of the chest was performed using the standard protocol during bolus administration of intravenous contrast. Multiplanar CT image reconstructions and MIPs were obtained to evaluate the vascular anatomy. RADIATION DOSE REDUCTION: This exam was performed according to the departmental dose-optimization program which includes automated exposure control, adjustment of the mA and/or kV according to patient size and/or use of iterative reconstruction technique. CONTRAST:  75mL OMNIPAQUE IOHEXOL 350 MG/ML SOLN COMPARISON:  12/20/2023 FINDINGS: Cardiovascular: Satisfactory opacification of the pulmonary arteries to the segmental level. No evidence of pulmonary embolism, right upper lobe embolism on prior is resolved. Enlarged heart size. No pericardial effusion. Extensive atheromatous calcification of the aorta and coronaries. Mediastinum/Nodes: Negative for mass or adenopathy. Upper esophageal fluid level. Lungs/Pleura: There is chronic opacity in the subpleural lower lobes and lingula, worsening opacification at both lung bases, especially in the left lower lobe where there is now multifocal bronchial opacification and obstruction. Continued rounded atelectasis appearance to the lingular opacity and right lower lobe opacity. Upper Abdomen: Extensive arterial calcification.  No acute finding Musculoskeletal: Body wall edema. Exaggerated thoracic kyphosis and mild scoliosis. Review of the MIP images confirms the above findings. IMPRESSION: 1. Bronchial airway filling the lung bases since recent comparison with pneumonia superimposed on chronic lung disease at the left base. Question interval aspiration. 2. Pulmonary embolism seen in the right upper lobe on prior has resolved. No interval pulmonary embolism. 3. Cardiomegaly and body  wall edema. Electronically Signed   By: Tiburcio Pea M.D.   On: 01/11/2024 05:14   DG Chest Portable 1 View Result Date: 01/11/2024 CLINICAL DATA:  Shortness of breath.  Dialysis.  Recent PE. EXAM: PORTABLE CHEST 1 VIEW COMPARISON:  Radiographs and CT from 12/20/2023 FINDINGS: Stable cardiomegaly. Aortic atherosclerotic calcification. Small to moderate left greater than right pleural effusions and associated airspace opacities. Pulmonary vascular congestion. No pneumothorax. No displaced rib fractures. IMPRESSION: Small moderate left greater than right pleural effusions similar to 12/20/2023. Basilar airspace opacities may be due to atelectasis or pneumonia. Electronically Signed   By: Minerva Fester M.D.   On: 01/11/2024 02:39    Procedures Procedures  {Document cardiac monitor, telemetry assessment procedure when appropriate:1}  Medications Ordered in ED Medications  azithromycin (ZITHROMAX) 500 mg in sodium chloride 0.9 % 250 mL IVPB (500 mg Intravenous New Bag/Given 01/11/24 0642)  iohexol (OMNIPAQUE) 350 MG/ML injection 75 mL (75 mLs Intravenous Contrast Given 01/11/24 0452)  lactated ringers bolus 1,000 mL (1,000 mLs Intravenous New Bag/Given 01/11/24 0641)  cefTRIAXone (ROCEPHIN) 2 g in sodium chloride 0.9 % 100 mL IVPB (0 g Intravenous Stopped 01/11/24 0640)    ED Course/ Medical Decision Making/ A&P   {  Click here for ABCD2, HEART and other calculatorsREFRESH Note before signing :1}                              Medical Decision Making Amount and/or Complexity of Data Reviewed Labs: ordered. Radiology: ordered.  Risk Prescription drug management.   ***  {Document critical care time when appropriate:1} {Document review of labs and clinical decision tools ie heart score, Chads2Vasc2 etc:1}  {Document your independent review of radiology images, and any outside records:1} {Document your discussion with family members, caretakers, and with consultants:1} {Document social  determinants of health affecting pt's care:1} {Document your decision making why or why not admission, treatments were needed:1} Final Clinical Impression(s) / ED Diagnoses Final diagnoses:  None    Rx / DC Orders ED Discharge Orders     None

## 2024-01-11 NOTE — ED Notes (Signed)
 Unable to obtain oral temp at this time. Attempted X2. Both unsuccessful.

## 2024-01-11 NOTE — Progress Notes (Signed)
 IV team RN X 2 responded to code blue page. Upon arrival, no new access needed.

## 2024-01-11 NOTE — Consult Note (Addendum)
 WOC Nurse Consult Note: Reason for Consult: L heel, sacrum/buttocks wounds  Wound type: 1.  Unstageable PI L heel  2.  Stage 3 Pressure Injury sacrum/buttocks  Pressure Injury POA: Yes Measurement: see nursing flowsheet  Wound bed:1.  L heel with 50% black eschar 50% brown necrotic tissue  2.  Sacral Stage 3 25% brown necrotic 75% pink moist; Buttocks Stage 3 75% pink moist 25% yellow  Drainage (amount, consistency, odor) see nursing flowsheet  Periwound: eschar to heel wound; peeling epithelium  Dressing procedure/placement/frequency: Cleanse L heel with Vashe wound cleanser Hart [place] [phone]), do not rinse and allow to air dry.  Apply Xeroform gauze to wound bed daily, cover with dry gauze and Kerlix roll gauze. Place L foot in Prevalon boot to offload pressure  Hart Rochester [phone]).  Cleanse sacrum and buttocks (intact skin and open wounds) with Vashe wound cleanser Hart [place] [phone]), do not rinse and allow to air dry.  Apply Medihoney to wound beds daily, cover with dry gauze.  May cover area with silicone foam or ABD pad and tape.   POC discussed with primary MD and bedside nurse. WOC team will not follow. Re-consult if further needs arise.   If aggressive measures are desired L heel would benefit from ortho/vascular evaluation.   Thank you,    Priscella Mann MSN, RN-BC, [employer] [phone]

## 2024-01-11 NOTE — Assessment & Plan Note (Signed)
 Patient very weak and deconditioned.   Chest CT with left lower lobe infiltrate suggestive aspiration,  Plan to continue aspiration precautions Consult speech therapy.  Start Unasy IV for antibiotic therapy.  Patient very weak and deconditioned with very poor prognosis.

## 2024-01-12 ENCOUNTER — Inpatient Hospital Stay (HOSPITAL_COMMUNITY): Payer: Medicare (Managed Care)

## 2024-01-12 DIAGNOSIS — I270 Primary pulmonary hypertension: Secondary | ICD-10-CM

## 2024-01-12 DIAGNOSIS — Z992 Dependence on renal dialysis: Secondary | ICD-10-CM | POA: Diagnosis not present

## 2024-01-12 DIAGNOSIS — J189 Pneumonia, unspecified organism: Secondary | ICD-10-CM

## 2024-01-12 DIAGNOSIS — Z7189 Other specified counseling: Secondary | ICD-10-CM

## 2024-01-12 DIAGNOSIS — I129 Hypertensive chronic kidney disease with stage 1 through stage 4 chronic kidney disease, or unspecified chronic kidney disease: Secondary | ICD-10-CM | POA: Diagnosis not present

## 2024-01-12 DIAGNOSIS — Z515 Encounter for palliative care: Secondary | ICD-10-CM

## 2024-01-12 DIAGNOSIS — R571 Hypovolemic shock: Secondary | ICD-10-CM | POA: Diagnosis not present

## 2024-01-12 DIAGNOSIS — I5032 Chronic diastolic (congestive) heart failure: Secondary | ICD-10-CM | POA: Diagnosis not present

## 2024-01-12 DIAGNOSIS — E43 Unspecified severe protein-calorie malnutrition: Secondary | ICD-10-CM | POA: Diagnosis not present

## 2024-01-12 DIAGNOSIS — N186 End stage renal disease: Secondary | ICD-10-CM | POA: Diagnosis not present

## 2024-01-12 LAB — CBC
HCT: 29 % — ABNORMAL LOW (ref 36.0–46.0)
Hemoglobin: 9.2 g/dL — ABNORMAL LOW (ref 12.0–15.0)
MCH: 28.4 pg (ref 26.0–34.0)
MCHC: 31.7 g/dL (ref 30.0–36.0)
MCV: 89.5 fL (ref 80.0–100.0)
Platelets: 51 10*3/uL — ABNORMAL LOW (ref 150–400)
RBC: 3.24 MIL/uL — ABNORMAL LOW (ref 3.87–5.11)
RDW: 16.8 % — ABNORMAL HIGH (ref 11.5–15.5)
WBC: 6.9 10*3/uL (ref 4.0–10.5)
nRBC: 0.7 % — ABNORMAL HIGH (ref 0.0–0.2)

## 2024-01-12 LAB — BASIC METABOLIC PANEL WITH GFR
Anion gap: 10 (ref 5–15)
BUN: 16 mg/dL (ref 8–23)
CO2: 20 mmol/L — ABNORMAL LOW (ref 22–32)
Calcium: 8.1 mg/dL — ABNORMAL LOW (ref 8.9–10.3)
Chloride: 100 mmol/L (ref 98–111)
Creatinine, Ser: 2.2 mg/dL — ABNORMAL HIGH (ref 0.44–1.00)
GFR, Estimated: 23 mL/min — ABNORMAL LOW (ref >60)
Glucose, Bld: 175 mg/dL — ABNORMAL HIGH (ref 70–99)
Potassium: 3.8 mmol/L (ref 3.5–5.1)
Sodium: 130 mmol/L — ABNORMAL LOW (ref 135–145)

## 2024-01-12 LAB — GLUCOSE, CAPILLARY
Glucose-Capillary: 111 mg/dL — ABNORMAL HIGH (ref 70–99)
Glucose-Capillary: 115 mg/dL — ABNORMAL HIGH (ref 70–99)
Glucose-Capillary: 123 mg/dL — ABNORMAL HIGH (ref 70–99)
Glucose-Capillary: 128 mg/dL — ABNORMAL HIGH (ref 70–99)
Glucose-Capillary: 132 mg/dL — ABNORMAL HIGH (ref 70–99)
Glucose-Capillary: 180 mg/dL — ABNORMAL HIGH (ref 70–99)
Glucose-Capillary: 185 mg/dL — ABNORMAL HIGH (ref 70–99)
Glucose-Capillary: 85 mg/dL (ref 70–99)

## 2024-01-12 LAB — ECHOCARDIOGRAM LIMITED
Height: 65 in
S' Lateral: 3 cm
Weight: 1798.95 [oz_av]

## 2024-01-12 LAB — HEPATITIS B SURFACE ANTIGEN: Hepatitis B Surface Ag: NONREACTIVE

## 2024-01-12 MED ORDER — HEPARIN SODIUM (PORCINE) 5000 UNIT/ML IJ SOLN
5000.0000 [IU] | Freq: Three times a day (TID) | INTRAMUSCULAR | Status: DC
Start: 2024-01-12 — End: 2024-01-19
  Administered 2024-01-12 – 2024-01-18 (×16): 5000 [IU] via SUBCUTANEOUS
  Filled 2024-01-12 (×16): qty 1

## 2024-01-12 MED ORDER — CHLORHEXIDINE GLUCONATE CLOTH 2 % EX PADS: 6.0000 | MEDICATED_PAD | Freq: Every day | CUTANEOUS | Status: DC

## 2024-01-12 MED ORDER — MIDODRINE HCL 5 MG PO TABS
5.0000 mg | ORAL_TABLET | Freq: Three times a day (TID) | ORAL | Status: DC | PRN
  Administered 2024-01-15 – 2024-01-18 (×3): 5 mg via ORAL
  Filled 2024-01-12 (×3): qty 1

## 2024-01-12 MED ORDER — SODIUM CHLORIDE 0.9 % IV SOLN
3.0000 g | INTRAVENOUS | Status: AC
  Administered 2024-01-12 – 2024-01-15 (×3): 3 g via INTRAVENOUS
  Filled 2024-01-12 (×3): qty 8

## 2024-01-12 NOTE — Progress Notes (Signed)
 [facility] Progress Note  Subjective:  Seen in ICU, family at bedside Pt on room air, no c/o's Not very talkative  Vitals:   01/12/24 0800 01/12/24 0900 01/12/24 1000 01/12/24 1058  BP: (!) 134/93 106/80 (!) 149/73   Pulse: 72 74 74   Resp: 20 17 17    Temp:    97.6 F (36.4 C)  TempSrc:    Oral  SpO2: 99% 95% 100%   Weight:      Height:        Exam: General: Frail elderly female, on RA Lungs: Clear bilaterally to auscultation  Heart: S1, S2 2/6 systolic M. No R/G Abdomen: NABS, NT Lower extremities: no LE edema Neuro: Alert and oriented X 2 Dialysis Access: L AVF + T/B   [facility]: [facility]  3h  B350   45.5kg  3K bath  AVF  LUE   Heparin 4000 - Mircera 30 mcg IV Q 4 weeks (last dose 12/30/2023)     Assessment/Plan:  Aspiration PNA - bilat LL infiltrates per CT. Hypothermia resolved, on IV abx  Brief PEA Arrest-short round of chest compressions, not intubated.  Shock- seems to be resolving. Per primary  ESRD - TTS HD. Next HD tomorrow.   Volume  - BP soft. No excess volume by exam. Small UF next HD.   Anemia  - HGB 10.0. Follow HGB.   Metabolic bone disease - CCa in range, cont binders w/ meals.   Nutrition - Very low albumin. Protein supplements H/O PE on apixaban  Vinson Moselle  MD  [facility] 01/12/2024, 2:55 PM  Recent Labs  Lab 01/11/24 0228 01/12/24 1048  HGB 10.0* 9.2*  ALBUMIN 2.5*  --   CALCIUM 8.5* 8.1*  CREATININE 1.48* 2.20*  K 3.4* 3.8    Inpatient medications:  Chlorhexidine Gluconate Cloth  6 each Topical Daily   heparin injection (subcutaneous)  5,000 Units Subcutaneous Q8H   leptospermum manuka honey  1 Application Topical Daily   multivitamin  1 tablet Oral Q M,W,F,Sa-1800   pneumococcal 20-valent conjugate vaccine  0.5 mL Intramuscular Tomorrow-1000   sevelamer carbonate  800 mg Oral TID WC    sodium chloride Stopped (01/11/24 1543)   ampicillin-sulbactam (UNASYN) IV     norepinephrine (LEVOPHED) Adult infusion Stopped  (01/11/24 1637)   acetaminophen **OR** acetaminophen, midodrine

## 2024-01-12 NOTE — Progress Notes (Signed)
 SLP Cancellation Note  Patient Details Name: Kara Mullins MRN: [id number] DOB: [birth year]   Cancelled treatment:       Reason Eval/Treat Not Completed: Patient's level of consciousness. Pt sleeping soundly upon arrival. She opened her eyes briefly to verbal/tactile stimulation, though otherwise was not able to maintain alertness to safely try PO trials. RN reported pt ate all of breakfast and took PO meds this morning with no concerns for aspiration observed. Per chart, pt underwent MBSS on 12/22/23 with no concerns for oropharyngeal dysphagia; however, concerns for esophageal dysphagia. SLP will follow up to see if clinical swallow assessment is needed.    Ellery Plunk 01/12/2024, 11:44 AM

## 2024-01-12 NOTE — Progress Notes (Signed)
 OT Cancellation Note  Patient Details Name: Kara Mullins MRN: [id number] DOB: November 28, 1950   Cancelled Treatment:    Reason Eval/Treat Not Completed: Patient declined, no reason specified  Lebron Quam, OTR/L [facility] [phone]   Dalphine Handing 01/12/2024, 1:57 PM

## 2024-01-12 NOTE — Progress Notes (Signed)
 Echocardiogram 2D Echocardiogram has been performed.  Kara Mullins 01/12/2024, 11:40 AM

## 2024-01-12 NOTE — Progress Notes (Signed)
 Pt receives out-pt HD at [facility] on TTS 6:45 am chair time. Will assist as needed.   Olivia Canter Renal Navigator [phone]

## 2024-01-12 NOTE — Progress Notes (Signed)
 PT Cancellation Note  Patient Details Name: Kara Mullins MRN: [id number] DOB: [birth year]   Cancelled Treatment:    Reason Eval/Treat Not Completed: Patient declined, no reason specified. Pt refused despite encouragement. Will try again tomorrow.   Angelina Ok [facility] 01/12/2024, 1:55 PM Skip Mayer PT Acute [employer] [phone]

## 2024-01-12 NOTE — TOC Initial Note (Signed)
 Transition of Care [facility]) - Initial/Assessment Note    Patient Details  Name: Kara Mullins MRN: [id number] Date of Birth: 03-27-1951  [facility] CM/SW Contact:    Marliss Coots, LCSW Phone Number: 01/12/2024, 3:21 PM  Clinical Narrative:                  3:21 PM CSW introduced self and role to patient at bedside. Patient confirmed she feels safe at home with daughter (one-story home) where she expects to return at discharge. Patient confirmed daughter could provide transportation at discharge and has used home health ([facility]) in the past.  Expected Discharge Plan: Home/Self Care Barriers to Discharge: Continued Medical Work up   Patient Goals and CMS Choice Patient states their goals for this hospitalization and ongoing recovery are:: to return home          Expected Discharge Plan and Services       Living arrangements for the past 2 months: Single Family Home                                      Prior Living Arrangements/Services Living arrangements for the past 2 months: Single Family Home Lives with:: Adult Children Patient language and need for interpreter reviewed:: Yes Do you feel safe going back to the place where you live?: Yes            Criminal Activity/Legal Involvement Pertinent to Current Situation/Hospitalization: No - Comment as needed  Activities of Daily Living   ADL Screening (condition at time of admission) Independently performs ADLs?: No Does the patient have a NEW difficulty with bathing/dressing/toileting/self-feeding that is expected to last >3 days?: No Does the patient have a NEW difficulty with getting in/out of bed, walking, or climbing stairs that is expected to last >3 days?: No Does the patient have a NEW difficulty with communication that is expected to last >3 days?: No Is the patient deaf or have difficulty hearing?: Yes Does the patient have difficulty seeing, even when wearing glasses/contacts?:  No Does the patient have difficulty concentrating, remembering, or making decisions?: No  Permission Sought/Granted Permission sought to share information with : Family Supports Permission granted to share information with : No (Contact information on chart)  Share Information with NAME: Angenette Daily     Permission granted to share info w Relationship: Daughter  Permission granted to share info w Contact Information: [phone]  Emotional Assessment Appearance:: Appears stated age Attitude/Demeanor/Rapport: Engaged Affect (typically observed): Accepting, Adaptable, Stable, Calm, Appropriate Orientation: : Oriented to Self, Oriented to Place, Oriented to  Time, Oriented to Situation Alcohol / Substance Use: Not Applicable Psych Involvement: No (comment)  Admission diagnosis:  Pneumonia [J18.9] Community acquired pneumonia, unspecified laterality [J18.9] Patient Active Problem List   Diagnosis Date Noted   Aspiration pneumonia of left lower lobe (HCC) 01/11/2024   Acute on chronic respiratory failure with hypoxia (HCC) 12/13/2023   Pleural effusion 12/13/2023   Heel ulcer (HCC) 12/13/2023   Influenza A 12/13/2023   PVD (peripheral vascular disease) (HCC) 12/13/2023   Thrombocytopenia (HCC) 12/13/2023   ESRD on hemodialysis (HCC) 06/11/2023   Chronic idiopathic thrombocytopenia (HCC) 06/11/2023   Acute on chronic diastolic (congestive) heart failure (HCC) 03/22/2023   Anemia of chronic disease 03/22/2023   Sacral wound 03/22/2023   Acute on chronic diastolic CHF (congestive heart failure) (HCC) 02/26/2023   Cardiorenal syndrome 02/25/2023   Chronic  kidney disease (CKD), stage IV (severe) (HCC) 02/25/2023   Hypoglycemia 08/15/2022   Hypertensive emergency 08/15/2022   Obesity (BMI 30-39.9) 08/15/2022   Acute on chronic diastolic heart failure (HCC) 08/14/2022   AKI (acute kidney injury) (HCC)    (HFpEF) heart failure with preserved ejection fraction (HCC) 06/02/2022   Acute  respiratory distress 06/02/2022   Bilateral lower extremity edema 06/02/2022   Hypocalcemia 06/02/2022   Subclinical hypothyroidism 06/02/2022   Transaminitis 06/02/2022   Acute kidney injury superimposed on chronic kidney disease (HCC) 03/28/2022   Anasarca associated with disorder of kidney 03/27/2022   Leukopenia 03/27/2022   Aortic atherosclerosis (HCC) 03/27/2022   Pressure injury of skin 01/02/2022   Hypotension 12/30/2021   Pneumonia due to COVID-19 virus 12/26/2021   Palliative care by specialist    Goals of care, counseling/discussion    General weakness    Sepsis (HCC) 12/22/2021   COVID-19 virus infection 12/22/2021   Paroxysmal atrial fibrillation (HCC) 12/22/2021   CKD (chronic kidney disease) stage 5, GFR less than 15 ml/min (HCC) 12/22/2021   Chronic anemia 12/22/2021   Prolonged QT interval 12/22/2021   Debility 11/20/2021   Metabolic acidosis 11/20/2021   Severe sepsis (HCC) 11/19/2021   Hypothermia 11/09/2021   Hyperlipidemia 11/09/2021   Chronic combined systolic and diastolic congestive heart failure (HCC) 11/09/2021   Pancytopenia (HCC) 11/09/2021   Severe protein-calorie malnutrition (HCC) 11/09/2021   Acute combined systolic and diastolic HF (heart failure) (HCC)    Acute exacerbation of CHF (congestive heart failure) (HCC) 03/06/2021   Essential hypertension    Hypertensive urgency 03/05/2021   Chronic diastolic CHF (congestive heart failure) (HCC) 03/05/2021   Nausea and vomiting 03/05/2021   Elevated troponin level not due myocardial infarction 03/05/2021   Elevated d-dimer 03/05/2021   Acute congestive heart failure (HCC) 03/05/2021   PCP:  Caesar Bookman, NP Pharmacy:   [id number] [place], [place] - [address] AT [facility] OF [facility] & [address] [place] [address] Phone: [phone] Fax: [phone]  [facility] - [place], [place] - [address] [place] [address] Phone: [phone] Fax: [phone]     Social Drivers of Health (SDOH) Social History: SDOH Screenings   Food Insecurity: No Food Insecurity (01/11/2024)  Housing: Low Risk  (01/11/2024)  Transportation Needs: No Transportation Needs (01/11/2024)  Utilities: Not At Risk (01/11/2024)  Alcohol Screen: Low Risk  (06/11/2022)  Depression (PHQ2-9): Low Risk  (01/27/2023)  Financial Resource Strain: Low Risk  (06/11/2022)  Social Connections: Moderately Isolated (01/11/2024)  Tobacco Use: Low Risk  (01/11/2024)   SDOH Interventions:     Readmission Risk Interventions    12/15/2023    3:44 PM 03/24/2023    2:26 PM 02/26/2023    2:53 PM  Readmission Risk Prevention Plan  Transportation Screening Complete Complete Complete  PCP or Specialist Appt within 3-5 Days Complete Complete   HRI or Home Care Consult Complete Complete Complete  Social Work Consult for Recovery Care Planning/Counseling  -- Complete  Palliative Care Screening Not Applicable Not Applicable Not Applicable  Medication Review (RN Care Manager) Complete Complete Referral to Pharmacy

## 2024-01-12 NOTE — Progress Notes (Signed)
 NAME:  Kara Mullins, MRN:  [id number], DOB:  1951/07/17, LOS: 1 ADMISSION DATE:  01/11/2024, CONSULTATION DATE:  01/11/24 REFERRING MD:  Ella Jubilee, CHIEF COMPLAINT:  SOB   History of Present Illness:  73 year old woman w/ history of longstanding ESRD, chronic pleural effusions, recent flu/PE admit presenting with FTT, SOB.  Found to be hypotensive.  Workup includes a CTA which shows likely aspiration.   BP has been soft on floor despite fluid so she is brought to ICU to consider peripheral pressors. Her only compliant is some mild dyspnea and anxiety.  Pertinent  Medical History  ESRD on HD via fistula HFpEF HTN HLD  Significant Hospital Events: Including procedures, antibiotic start and stop dates in addition to other pertinent events   3/30 admit to 4e then to 61m  Interim History / Subjective:  OV was hypoglycemia,  was transition from D5 to D10.  This morning, she ate all her breakfast.  Daughter at bedside. Off pressors, BP 135/70.  Objective   Blood pressure (!) 149/73, pulse 74, temperature 97.6 F (36.4 C), temperature source Oral, resp. rate 17, height 5\' 5"  (1.651 m), weight 51 kg, SpO2 100%.        Filed Weights   01/11/24 0214 01/11/24 0852  Weight: 48.1 kg 51 kg   Examination: General: nontoxic appearing, chronically ill HENT: temporal wasting, MMM, trachea midline Lungs: diminished bases, nonlabored breathing pattern Cardiovascular: RRR, pronounced P2 Abdomen: soft, +BS Extremities: chronic PAD and venous stasis changes, no edema Neuro: Awake and alert to person, place and time.  Resolved Hospital Problem list   N/A  Assessment & Plan:  Shock, resolved Hypovolemic Likely hypovolemia from decreased p.o. intake, and hypoglycemia.  No episodes of hypotension overnight,  MAP's >65 without pressors, blood glucose have improved.  Vancomycin was added overnight due to concern for sepsis, MRSA PCR negative. Morning cortisol was normal, will discontinue  hydrocortisone.  Patient is medically stable to leave ICU to medical floor.  -Discontinue D10. -Discontinue hydrocortisone. -Discontinued IV Vancomycin -PRN midodrine for SBP< 100 -Holding home antihypertensives. - PT/OT treat and eval.  Aspiration pneumonia Will treat with Unasyn for 5 days - # 2/5 for Unasyn.  Chronic leukopenia Thrombocytopenia, acute on chronic Hb stable, platelets improved 39 >>55, no signs of bleeding.  ESRD on Tue/Th/Sat  SCr 1.48 >>2.20, stable electrolytes otherwise.  - Next dialysis 4/1, nephrology following.  Best Practice (right click and "Reselect all SmartList Selections" daily)   Diet/type: Regular consistency (see orders) DVT prophylaxis: Heparin Pressure ulcer(s): present on admission  GI prophylaxis: N/A Lines: N/A Foley:  N/A Code Status:  full code Last date of multidisciplinary goals of care discussion [daughter updated]  Labs   CBC: Recent Labs  Lab 01/11/24 0228  WBC 4.0  HGB 10.0*  HCT 32.8*  MCV 92.4  PLT 33*    Basic Metabolic Panel: Recent Labs  Lab 01/11/24 0228  NA 131*  K 3.4*  CL 98  CO2 25  GLUCOSE 77  BUN 10  CREATININE 1.48*  CALCIUM 8.5*   GFR: Estimated Creatinine Clearance: 28.1 mL/min (A) (by C-G formula based on SCr of 1.48 mg/dL (H)). Recent Labs  Lab 01/11/24 0228 01/11/24 0432 01/11/24 1622  PROCALCITON  --  0.72  --   WBC 4.0  --   --   LATICACIDVEN  --   --  1.0    Liver Function Tests: Recent Labs  Lab 01/11/24 0228  AST 28  ALT 18  ALKPHOS 69  BILITOT  0.6  PROT 7.1  ALBUMIN 2.5*   Recent Labs  Lab 01/11/24 0228  LIPASE 29   No results for input(s): "AMMONIA" in the last 168 hours.  ABG    Component Value Date/Time   TCO2 24 06/02/2022 0337     Coagulation Profile: Recent Labs  Lab 01/11/24 0228  INR 1.4*    Cardiac Enzymes: No results for input(s): "CKTOTAL", "CKMB", "CKMBINDEX", "TROPONINI" in the last 168 hours.  HbA1C: No results found for:  "HGBA1C"  CBG: Recent Labs  Lab 01/11/24 2127 01/12/24 0002 01/12/24 0330 01/12/24 0756 01/12/24 1101  GLUCAP 118* 115* 111* 128* 180*    Review of Systems:    Positive Symptoms in bold:  Constitutional fevers, chills, weight loss, fatigue, anorexia, malaise  Eyes decreased vision, double vision, eye irritation  Ears, Nose, Mouth, Throat sore throat, trouble swallowing, sinus congestion  Cardiovascular chest pain, paroxysmal nocturnal dyspnea, lower ext edema, palpitations   Respiratory SOB, cough, DOE, hemoptysis, wheezing  Gastrointestinal nausea, vomiting, diarrhea  Genitourinary burning with urination, trouble urinating  Musculoskeletal joint aches, joint swelling, back pain  Integumentary  rashes, skin lesions  Neurological focal weakness, focal numbness, trouble speaking, headaches  Psychiatric depression, anxiety, confusion  Endocrine polyuria, polydipsia, cold intolerance, heat intolerance  Hematologic abnormal bruising, abnormal bleeding, unexplained nose bleeds  Allergic/Immunologic recurrent infections, hives, swollen lymph nodes     Past Medical History:  She,  has a past medical history of Chronic combined systolic and diastolic congestive heart failure (HCC) (11/09/2021), Generalized anxiety disorder, Hyperlipidemia (11/09/2021), Hypertension, and Stage 3b chronic kidney disease (CKD) (HCC) (11/09/2021).   Surgical History:   Past Surgical History:  Procedure Laterality Date   AV FISTULA PLACEMENT Left 03/27/2023   Procedure: LEFT ARM BRACHIOCEPHALIC ARTERIOVENOUS (AV) FISTULA CREATION;  Surgeon: [name], MD;  Location: [facility] OR;  Service: Vascular;  Laterality: Left;   CATARACT EXTRACTION, BILATERAL     combined systolic and diastolic congestive heart failure      INSERTION OF DIALYSIS CATHETER N/A 03/27/2023   Procedure: INSERTION OF RIGHT INTERNAL JUGULAR TUNNELED DIALYSIS CATHETER;  Surgeon: [name], MD;  Location: [facility] OR;  Service:  Vascular;  Laterality: N/A;   IR THORACENTESIS ASP PLEURAL SPACE W/IMG GUIDE  06/12/2023     Social History:   reports that she has never smoked. She has never used smokeless tobacco. She reports that she does not currently use alcohol. She reports that she does not use drugs.   Family History:  Her family history includes Throat cancer in her father.   Allergies Allergies  Allergen Reactions   Crestor [Rosuvastatin] Shortness Of Breath   Nitrofuran Derivatives Nausea Only     Home Medications  Prior to Admission medications   Medication Sig Start Date End Date Taking? Authorizing Provider  APIXABAN (ELIQUIS) VTE STARTER PACK (10MG  AND 5MG ) Take as directed on package: start with two-5mg  tablets twice daily for 7 days. On day 8, switch to one-5mg  tablet twice daily. 12/20/23  Yes [name], [name], DO  carvedilol (COREG) 3.125 MG tablet Take 3.125 mg by mouth 2 (two) times daily.   Yes [provider]  furosemide (LASIX) 80 MG tablet Take 1 tablet (80 mg total) by mouth every Monday, Wednesday, Friday and Sunday. 11/20/23  Yes [name], [name], NP  lidocaine-prilocaine (EMLA) cream APPLY 1 APPLICATION TOPICALLY AS NEEDED.(USE 30 MINUTES PRIOR TO DIALYSIS). 12/29/23  Yes [name], [name], NP  Menthol-Methyl Salicylate (MUSCLE RUB) 10-15 % CREA Apply 1 Application topically  daily as needed for muscle pain.   Yes [provider]  multivitamin (RENA-VIT) TABS tablet Take 1 tablet by mouth daily. Patient taking differently: Take 1 tablet by mouth See admin instructions. Take 1 tablet by mouth every Mon, Wed, Fri, Sat 06/19/23  Yes [name], [name], NP  OVER THE COUNTER MEDICATION Take 1 fluid ounce by mouth daily. Liquid Cell Protein   Yes [provider]  sevelamer carbonate (RENVELA) 800 MG tablet Take 1 tablet (800 mg total) by mouth 3 (three) times daily with meals. 08/29/23  Yes [name], [name], NP  silver sulfADIAZINE (SILVADENE) 1 % cream Apply 1 Application  topically daily. 12/02/23  Yes [name], [name]  fluticasone (FLONASE) 50 MCG/ACT nasal spray Place 2 sprays into both nostrils daily. Patient not taking: Reported on 01/11/2024 12/09/23   [name], [name]  isosorbide mononitrate (IMDUR) 30 MG 24 hr tablet Take 0.5 tablets (15 mg total) by mouth daily. Patient not taking: Reported on 01/11/2024 12/23/23   [name], MD     Critical care time: 58 mins    Laretta Bolster, M.D.  Internal Medicine Resident, PGY-1 [facility]  Pager: [phone] 11:54 AM, 01/12/2024

## 2024-01-12 NOTE — Consult Note (Signed)
 Consultation Note Date: 01/12/2024   Patient Name: Kara Mullins  DOB: [birth year]  MRN: [id number]  Age / Sex: 73 y.o., female  PCP: [name], [name], NP Referring Physician: Martina Sinner, MD  Reason for Consultation: Establishing goals of care  HPI/Patient Profile: 73 y.o. female  admitted on 01/11/2024 with history of longstanding ESRD, chronic pleural effusions, recent flu/PE admit presenting with FTT, SOB. Found to be hypotensive. Workup includes a CTA which shows likely aspiration.   Patient and family face treatment option decisions, advanced directive decisions and anticipatory care needs.  Clinical Assessment and Goals of Care:  This NP Lorinda Creed reviewed medical records, received report from team, assessed the patient and then meet at the patient's bedside along with her  daughter/ Kara Mullins      to discuss diagnosis, prognosis, GOC, EOL wishes disposition and options.   Concept of Palliative Care was introduced as specialized medical care for people and their families living with serious illness.  If focuses on providing relief from the symptoms and stress of a serious illness.  The goal is to improve quality of life for both the patient and the family. Values and goals of care important to patient and family were attempted to be elicited.  Created space and opportunity for patient  and family to explore thoughts and feelings regarding current medical situation.  Family/patient report continued physical and functional decline over the past year.  She reports a 70 pound weight loss in the last year. Patient lives at home with her daughter and son-in-law.  Increasing personal care needs.     A  discussion was had today regarding advanced directives.  Concepts specific to code status, artifical feeding and hydration, continued IV antibiotics and rehospitalization was had.  The difference  between a aggressive medical intervention path  and a palliative comfort care path for this patient at this time was had.    MOST form reintroduced and completed scanned for copying,    Natural trajectory and expectations at EOL were discussed.  Questions and concerns addressed.  Patient  encouraged to call with questions or concerns.     PMT will continue to support holistically.             NEXT OF KIN  Family requests assistance with securing H POA, will place consult to spiritual care.    SUMMARY OF RECOMMENDATIONS    Code Status/Advance Care Planning: Full code Educated patient/family to consider DNR/DNI status understanding evidenced based poor outcomes in similar hospitalized patient, as the cause of arrest is likely associated with advanced chronic illness rather than an easily reversible acute cardio-pulmonary event.Educated patient/family to consider DNR/DNI status understanding evidenced based poor outcomes in similar hospitalized patient, as the cause of arrest is likely associated with advanced chronic illness rather than an easily reversible acute cardio-pulmonary event.   Symptom Management:  Per attending   Palliative Prophylaxis:  Aspiration, Delirium Protocol, and Frequent Pain Assessment  Additional Recommendations (Limitations, Scope, Preferences): Full Scope Treatment  Psycho-social/Spiritual:  Desire for  further Chaplaincy support:yes Additional Recommendations: Education on Hospice  Prognosis:  Unable to determine  Discharge Planning: To Be Determined      Primary Diagnoses: Present on Admission:  Aspiration pneumonia of left lower lobe (HCC)  Essential hypertension  Heel ulcer (HCC)  Severe protein-calorie malnutrition (HCC)  Hypoglycemia   I have reviewed the medical record, interviewed the patient and family, and examined the patient. The following aspects are pertinent.  Past Medical History:  Diagnosis Date   Chronic combined  systolic and diastolic congestive heart failure (HCC) 11/09/2021   Generalized anxiety disorder    Hyperlipidemia 11/09/2021   Hypertension    Stage 3b chronic kidney disease (CKD) (HCC) 11/09/2021   Social History   Socioeconomic History   Marital status: Divorced    Spouse name: Not on file   Number of children: 1   Years of education: Not on file   Highest education level: Not on file  Occupational History   Occupation: retired  Tobacco Use   Smoking status: Never   Smokeless tobacco: Never  Vaping Use   Vaping status: Never Used  Substance and Sexual Activity   Alcohol use: Not Currently   Drug use: Never   Sexual activity: Not Currently    Partners: Male    Comment: Divorced  Other Topics Concern   Not on file  Social History Narrative   ** Merged History Encounter **       Tobacco use, amount per day now: N/A Past tobacco use, amount per day: N/A How many years did you use tobacco: N/A Alcohol use (drinks per week): N/A Diet: Do you drink/eat things with caffeine: Coffee Marital status: Divorced                                    What year were you married? 1992 Do you live in a house, apartment, assisted living, condo, trailer, etc.? Is it one or more stories? How many persons live in your home? 2 Do you have pets in your home?( please list) No Highest Level of e   ducation completed? High School Current or past profession: Restaurant Employee Do you exercise?   Yes                               Type and how often? Walk Do you have a living will? No Do you have a DNR form?    No                               If    not, do you want to discuss one? Do you have signed POA/HPOA forms? No                       If so, please bring to you appointment  Do you have any difficulty bathing or dressing yourself? No Do you have any difficulty preparing food or eating? No    Do you have any difficulty managing your medications? No Do you have any difficulty managing  your finances? No Do you have any difficulty affording your medications? No   Social Drivers of Corporate investment banker Strain: Low Risk  (06/11/2022)   Overall Financial Resource Strain (CARDIA)    Difficulty of Paying Living Expenses: Not very hard  Food Insecurity: No Food Insecurity (01/11/2024)   Hunger Vital Sign    Worried About Running Out of Food in the Last Year: Never true    Ran Out of Food in the Last Year: Never true  Transportation Needs: No Transportation Needs (01/11/2024)   PRAPARE - Administrator, Civil Service (Medical): No    Lack of Transportation (Non-Medical): No  Physical Activity: Not on file  Stress: Not on file  Social Connections: Moderately Isolated (01/11/2024)   Social Connection and Isolation Panel [NHANES]    Frequency of Communication with Friends and Family: More than three times a week    Frequency of Social Gatherings with Friends and Family: More than three times a week    Attends Religious Services: Never    Database administrator or Organizations: No    Attends Engineer, structural: 1 to 4 times per year    Marital Status: Divorced   Family History  Problem Relation Age of Onset   Throat cancer Father    Scheduled Meds:  Chlorhexidine Gluconate Cloth  6 each Topical Daily   hydrocortisone sod succinate (SOLU-CORTEF) inj  100 mg Intravenous Q12H   leptospermum manuka honey  1 Application Topical Daily   midodrine  5 mg Oral TID WC   multivitamin  1 tablet Oral Q M,W,F,Sa-1800   pneumococcal 20-valent conjugate vaccine  0.5 mL Intramuscular Tomorrow-1000   sevelamer carbonate  800 mg Oral TID WC   vancomycin variable dose per unstable renal function (pharmacist dosing)   Does not apply See admin instructions   Continuous Infusions:  sodium chloride Stopped (01/11/24 1543)   ampicillin-sulbactam (UNASYN) IV     dextrose 50 mL/hr at 01/12/24 0600   norepinephrine (LEVOPHED) Adult infusion Stopped (01/11/24 1637)    PRN Meds:.acetaminophen **OR** acetaminophen Medications Prior to Admission:  Prior to Admission medications   Medication Sig Start Date End Date Taking? Authorizing Provider  APIXABAN (ELIQUIS) VTE STARTER PACK (10MG  AND 5MG ) Take as directed on package: start with two-5mg  tablets twice daily for 7 days. On day 8, switch to one-5mg  tablet twice daily. 12/20/23  Yes [name], [name], DO  carvedilol (COREG) 3.125 MG tablet Take 3.125 mg by mouth 2 (two) times daily.   Yes [provider]  furosemide (LASIX) 80 MG tablet Take 1 tablet (80 mg total) by mouth every Monday, Wednesday, Friday and Sunday. 11/20/23  Yes [name], [name], NP  lidocaine-prilocaine (EMLA) cream APPLY 1 APPLICATION TOPICALLY AS NEEDED.(USE 30 MINUTES PRIOR TO DIALYSIS). 12/29/23  Yes [name], [name], NP  Menthol-Methyl Salicylate (MUSCLE RUB) 10-15 % CREA Apply 1 Application topically daily as needed for muscle pain.   Yes [provider]  multivitamin (RENA-VIT) TABS tablet Take 1 tablet by mouth daily. Patient taking differently: Take 1 tablet by mouth See admin instructions. Take 1 tablet by mouth every Mon, Wed, Fri, Sat 06/19/23  Yes [name], [name], NP  OVER THE COUNTER MEDICATION Take 1 fluid ounce by mouth daily. Liquid Cell Protein   Yes [provider]  sevelamer carbonate (RENVELA) 800 MG tablet Take 1 tablet (800 mg total) by mouth 3 (three) times daily with meals. 08/29/23  Yes [name], [name], NP  silver sulfADIAZINE (SILVADENE) 1 % cream Apply 1 Application topically daily. 12/02/23  Yes [name], [name]  fluticasone (FLONASE) 50 MCG/ACT nasal spray Place 2 sprays into both nostrils daily. Patient not taking: Reported on 01/11/2024 12/09/23   [name], [name]  isosorbide mononitrate (IMDUR) 30 MG 24 hr tablet Take 0.5 tablets (15 mg total) by mouth daily. Patient not taking: Reported on 01/11/2024 12/23/23   [name], MD   Allergies  Allergen  Reactions   Crestor [Rosuvastatin] Shortness Of Breath   Nitrofuran Derivatives Nausea Only   Review of Systems  Unable to perform ROS   Physical Exam Constitutional:      Appearance: She is cachectic. She is ill-appearing.  Cardiovascular:     Rate and Rhythm: Normal rate.  Pulmonary:     Effort: Pulmonary effort is normal.  Musculoskeletal:     Comments: Generalized weakness and muscle atrophy   Skin:    General: Skin is warm and dry.  Neurological:     Mental Status: She is lethargic.     Vital Signs: BP (!) 166/81   Pulse 70   Temp (!) 96.3 F (35.7 C) (Axillary)   Resp 19   Ht 5\' 5"  (1.651 m)   Wt 51 kg   SpO2 93%   BMI 18.71 kg/m  Pain Scale: 0-10   Pain Score: Asleep   SpO2: SpO2: 93 % O2 Device:SpO2: 93 % O2 Flow Rate: .O2 Flow Rate (L/min): 2 L/min  IO: Intake/output summary:  Intake/Output Summary (Last 24 hours) at 01/12/2024 0945 Last data filed at 01/12/2024 0600 Gross per 24 hour  Intake 1799.79 ml  Output --  Net 1799.79 ml    LBM: Last BM Date : 01/11/24 Baseline Weight: Weight: 48.1 kg Most recent weight: Weight: 51 kg     Palliative Assessment/Data: 40 % at best     Time: 75 minutes   Signed by: Lorinda Creed, NP   Please contact Palliative Medicine Team phone at [phone] for questions and concerns.  For individual provider: See Loretha Stapler

## 2024-01-13 ENCOUNTER — Inpatient Hospital Stay (HOSPITAL_COMMUNITY): Payer: Medicare (Managed Care)

## 2024-01-13 DIAGNOSIS — J690 Pneumonitis due to inhalation of food and vomit: Secondary | ICD-10-CM | POA: Diagnosis not present

## 2024-01-13 LAB — HEMOGLOBIN A1C
Hgb A1c MFr Bld: 4.1 % — ABNORMAL LOW (ref 4.8–5.6)
Mean Plasma Glucose: 70.97 mg/dL

## 2024-01-13 LAB — BASIC METABOLIC PANEL WITH GFR
Anion gap: 11 (ref 5–15)
BUN: 23 mg/dL (ref 8–23)
CO2: 22 mmol/L (ref 22–32)
Calcium: 7.9 mg/dL — ABNORMAL LOW (ref 8.9–10.3)
Chloride: 94 mmol/L — ABNORMAL LOW (ref 98–111)
Creatinine, Ser: 2.54 mg/dL — ABNORMAL HIGH (ref 0.44–1.00)
GFR, Estimated: 20 mL/min — ABNORMAL LOW (ref >60)
Glucose, Bld: 108 mg/dL — ABNORMAL HIGH (ref 70–99)
Potassium: 3.5 mmol/L (ref 3.5–5.1)
Sodium: 127 mmol/L — ABNORMAL LOW (ref 135–145)

## 2024-01-13 LAB — GLUCOSE, CAPILLARY
Glucose-Capillary: 91 mg/dL (ref 70–99)
Glucose-Capillary: 64 mg/dL — ABNORMAL LOW (ref 70–99)
Glucose-Capillary: 104 mg/dL — ABNORMAL HIGH (ref 70–99)
Glucose-Capillary: 100 mg/dL — ABNORMAL HIGH (ref 70–99)
Glucose-Capillary: 64 mg/dL — ABNORMAL LOW (ref 70–99)
Glucose-Capillary: 70 mg/dL (ref 70–99)
Glucose-Capillary: 75 mg/dL (ref 70–99)

## 2024-01-13 LAB — CBC
HCT: 31.4 % — ABNORMAL LOW (ref 36.0–46.0)
Hemoglobin: 9.9 g/dL — ABNORMAL LOW (ref 12.0–15.0)
MCH: 27.8 pg (ref 26.0–34.0)
MCHC: 31.5 g/dL (ref 30.0–36.0)
MCV: 88.2 fL (ref 80.0–100.0)
Platelets: 65 10*3/uL — ABNORMAL LOW (ref 150–400)
RBC: 3.56 MIL/uL — ABNORMAL LOW (ref 3.87–5.11)
RDW: 16.5 % — ABNORMAL HIGH (ref 11.5–15.5)
WBC: 7.2 10*3/uL (ref 4.0–10.5)
nRBC: 1 % — ABNORMAL HIGH (ref 0.0–0.2)

## 2024-01-13 LAB — MAGNESIUM: Magnesium: 2 mg/dL (ref 1.7–2.4)

## 2024-01-13 LAB — TSH: TSH: 1.93 u[IU]/mL (ref 0.350–4.500)

## 2024-01-13 LAB — BRAIN NATRIURETIC PEPTIDE: B Natriuretic Peptide: 1016 pg/mL — ABNORMAL HIGH (ref 0.0–100.0)

## 2024-01-13 LAB — CORTISOL: Cortisol, Plasma: 16.9 ug/dL

## 2024-01-13 LAB — PHOSPHORUS: Phosphorus: 1.5 mg/dL — ABNORMAL LOW (ref 2.5–4.6)

## 2024-01-13 LAB — C-REACTIVE PROTEIN: CRP: 4.4 mg/dL — ABNORMAL HIGH (ref <1.0)

## 2024-01-13 LAB — HEPATITIS B SURFACE ANTIBODY, QUANTITATIVE: Hep B S AB Quant (Post): 21 m[IU]/mL

## 2024-01-13 LAB — T4, FREE: Free T4: 0.83 ng/dL (ref 0.61–1.12)

## 2024-01-13 MED ORDER — GLUCOSE 40 % PO GEL
1.0000 | ORAL | Status: AC
  Administered 2024-01-13: 31 g via ORAL
  Filled 2024-01-13: qty 1.21

## 2024-01-13 MED ORDER — HYDRALAZINE HCL 20 MG/ML IJ SOLN: 10.0000 mg | Freq: Four times a day (QID) | INTRAMUSCULAR | Status: DC | PRN

## 2024-01-13 MED ORDER — CARVEDILOL 3.125 MG PO TABS: 3.1250 mg | ORAL_TABLET | Freq: Two times a day (BID) | ORAL | Status: DC

## 2024-01-13 MED ORDER — ISOSORBIDE MONONITRATE ER 30 MG PO TB24
15.0000 mg | ORAL_TABLET | Freq: Every day | ORAL | Status: DC
  Administered 2024-01-14 – 2024-01-17 (×3): 15 mg via ORAL
  Filled 2024-01-13 (×6): qty 1

## 2024-01-13 MED ORDER — SODIUM PHOSPHATES 45 MMOLE/15ML IV SOLN
30.0000 mmol | Freq: Once | INTRAVENOUS | Status: DC
  Filled 2024-01-13: qty 10

## 2024-01-13 MED ORDER — CARVEDILOL 3.125 MG PO TABS
3.1250 mg | ORAL_TABLET | Freq: Two times a day (BID) | ORAL | Status: DC
Start: 2024-01-13 — End: 2024-01-19
  Administered 2024-01-13 – 2024-01-16 (×3): 3.125 mg via ORAL
  Filled 2024-01-13 (×9): qty 1

## 2024-01-13 NOTE — Progress Notes (Signed)
 [facility] Progress Note  Subjective:  Seen in regular room Pt is somnolent again Daughter at bedside Pt is WC bound at home, was going to do PT as outpatient but is now back in hospital again  Vitals:   01/12/24 1800 01/12/24 1931 01/12/24 2318 01/13/24 0416  BP: (!) 148/113 139/73 (!) 134/96 (!) 143/103  Pulse: 64 64 63 62  Resp: 14 12 15 14   Temp:  (!) 97.3 F (36.3 C) 97.9 F (36.6 C) (!) 97.5 F (36.4 C)  TempSrc:  Oral Oral Oral  SpO2: 100% 100% 100% 100%  Weight:      Height:        Exam: General: Frail elderly female, on RA Lungs: Clear bilaterally to auscultation  Heart: S1, S2 2/6 systolic M. No R/G Abdomen: NABS, NT Lower extremities: no LE edema Neuro: Alert and oriented X 2 Dialysis Access: L AVF + T/B   [facility] HD: [facility]  3h  B350   45.5kg  3K bath  AVF  LUE   Heparin 4000 - Mircera 30 mcg IV Q 4 weeks (last dose 12/30/2023)     Assessment/Plan:  Aspiration PNA - bilat LL infiltrates per CT. Hypothermia resolved, on IV abx  Brief PEA Arrest-short round of chest compressions, not intubated.  Shock- resolved now, BP's 140/80.  ESRD - TTS HD. Next HD today 2nd shift.   Volume  - BP soft. No excess volume by exam. Small UF next HD.   Anemia  - HGB 10.0. Follow HGB.   Metabolic bone disease - CCa in range, cont binders w/ meals.   Nutrition - Very low albumin. Protein supplements H/O PE on apixaban  Vinson Moselle  MD  [facility] 01/13/2024, 11:31 AM  Recent Labs  Lab 01/11/24 0228 01/12/24 1048 01/13/24 0503  HGB 10.0* 9.2* 9.9*  ALBUMIN 2.5*  --   --   CALCIUM 8.5* 8.1* 7.9*  PHOS  --   --  1.5*  CREATININE 1.48* 2.20* 2.54*  K 3.4* 3.8 3.5    Inpatient medications:  carvedilol  3.125 mg Oral BID   Chlorhexidine Gluconate Cloth  6 each Topical Daily   Chlorhexidine Gluconate Cloth  6 each Topical Q0600   heparin injection (subcutaneous)  5,000 Units Subcutaneous Q8H   isosorbide mononitrate  15 mg Oral Daily   leptospermum  manuka honey  1 Application Topical Daily   multivitamin  1 tablet Oral Q M,W,F,Sa-1800   pneumococcal 20-valent conjugate vaccine  0.5 mL Intramuscular Tomorrow-1000   sevelamer carbonate  800 mg Oral TID WC    ampicillin-sulbactam (UNASYN) IV 3 g (01/12/24 1751)   sodium PHOSPHATE IVPB (in mmol)     acetaminophen **OR** acetaminophen, hydrALAZINE, midodrine

## 2024-01-13 NOTE — TOC Progression Note (Addendum)
 Transition of Care [facility]) - Progression Note    Patient Details  Name: Kara Mullins MRN: [id number] Date of Birth: 01-14-1951  [facility] CM/SW Contact  Gordy Clement, RN Phone Number: 01/13/2024, 12:42 PM  Clinical Narrative:    RNCM met with patient and Daughter bedside to discuss dc plan for home. Patient had home health in place with [facility] prior to admission. Resumption of care at dc to home. [facility] has been notified. RNCM will continue to follow      Expected Discharge Plan: Home/Self Care Barriers to Discharge: Continued Medical Work up  Expected Discharge Plan and Services       Living arrangements for the past 2 months: Single Family Home                                       Social Determinants of Health (SDOH) Interventions SDOH Screenings   Food Insecurity: No Food Insecurity (01/11/2024)  Housing: Low Risk  (01/11/2024)  Transportation Needs: No Transportation Needs (01/11/2024)  Utilities: Not At Risk (01/11/2024)  Alcohol Screen: Low Risk  (06/11/2022)  Depression (PHQ2-9): Low Risk  (01/27/2023)  Financial Resource Strain: Low Risk  (06/11/2022)  Social Connections: Moderately Isolated (01/11/2024)  Tobacco Use: Low Risk  (01/11/2024)    Readmission Risk Interventions    12/15/2023    3:44 PM 03/24/2023    2:26 PM 02/26/2023    2:53 PM  Readmission Risk Prevention Plan  Transportation Screening Complete Complete Complete  PCP or Specialist Appt within 3-5 Days Complete Complete   HRI or Home Care Consult Complete Complete Complete  Social Work Consult for Recovery Care Planning/Counseling  -- Complete  Palliative Care Screening Not Applicable Not Applicable Not Applicable  Medication Review (RN Care Manager) Complete Complete Referral to Pharmacy

## 2024-01-13 NOTE — Progress Notes (Signed)
 OT Cancellation Note  Patient Details Name: Kara Mullins MRN: [id number] DOB: Jan 25, 1951   Cancelled Treatment:    Reason Eval/Treat Not Completed: Patient declined, no reason specified   Lebron Quam, OTR/L [facility] [phone]  [name] 01/13/2024, 12:02 PM

## 2024-01-13 NOTE — Plan of Care (Signed)

## 2024-01-13 NOTE — Progress Notes (Signed)
 Received patient in bed.Alert and oriented x 3. She signed her hd treatment consent.  Access used : Left arm AVF that worked well.  Duration of treatment: 3.15 hours.  Net uf goal  1.8 L  Tolerated treatment: Not tolerating uf set at 2 liters thus a net uf of 1.8  Hand off to the patient' s nurse: Back into her room with stable medical condition via transporter

## 2024-01-13 NOTE — Progress Notes (Addendum)
 PROGRESS NOTE                                                                                                                                                                                                             Patient Demographics:    Kara Mullins, is a 73 y.o. female, DOB - 12/10/1950, JXB:[id number]  Outpatient Primary MD for the patient is Mullins, Kara Citrin, NP    LOS - 2  Admit date - 01/11/2024    Chief Complaint  Patient presents with   Shortness of Breath       Brief Narrative (HPI from H&P)    73 y.o. female with medical history significant of ESRD on HD, heart failure, hypertension and hyperlipidemia who presented with dyspnea and failure to thrive.  She lives with her daughter who is her caregiver. Patient was recently hospitalized 03/1 to 12/16/23 for influenza A infection with bacterial pneumonia superinfection. She was treated with oseltamivir and antibiotic therapy.  Patient at home continue to be very weak and deconditioned. Very poor oral intake, progressive less mobility and worsening dyspnea on exertion.  She has been using a wheelchair for over one year.    03/08 ED visit for dyspnea, CT chest was done, and resulted positive for pulmonary embolism, subsegmental right upper lobe. Small thromboembolic burden. Placed on apixaban for anticoagulation,    03/11 Cardiology follow up recommended to continue furosemide on non HD days.     At home her daughter noted patient's blood pressure bing low over the last 6 days, on 03/25 HD her daughter was told that her mother's blood pressure was not low. Yesterday patient had dialysis, when reached home she continue very weak and deconditioned.  He was brought to the hospital for weakness, hypotension and shortness of breath.  Workup consistent with pneumonia, she was found to be extremely hypotensive required pressors in ICU, stabilized transferred to my care on  01/13/2024.   Subjective:    [name] today has, No headache, No chest pain, No abdominal pain - No Nausea, No new weakness tingling or numbness, no SOB.   Assessment  & Plan :   Aspiration pneumonia of left lower lobe (HCC) - patient very weak and deconditioned. Chest CT with left lower lobe infiltrate suggestive aspiration,, completing 5 days of Unasyn, speech therapy following.  Pneumonia clinically much  improved.  Hypotension caused by severe dehydration, decreased oral intake, diuretic dose unknown HD days -she was admitted to ICU, received gentle IV fluids, along with IV hydrocortisone, blood pressure has stabilized off of IV fluids and steroids.  Discontinue Lasix use upon discharge.   Hypoglycemia Patient had hypoglycemia episode, triggering bradycardia and less responsiveness, Code blue was called, she received 2 chest compression, for weak pulse, no fran cardiac arrest. She received D50 with improvement in her hemodynamics.  Monitor CBGs closely, this happened in ICU will check TSH, A1c and cortisol levels.   CBG (last 3)  Recent Labs    01/13/24 0407 01/13/24 0809 01/13/24 0850  GLUCAP 91 64* 104*   No results found for: "HGBA1C"  ESRD on hemodialysis (HCC)  -   Renal following, TTS schedule, Access left arm fistula   Chronic diastolic CHF (congestive heart failure) (HCC) preserved at 65%.  Fluid removal via HD.  Due to severe hypotension upon admission discontinue Lasix use on non-HD days, if blood pressure is stable will cautiously resume Coreg .   Severe protein-calorie malnutrition (HCC)  Consult nutrition, her po intake has significantly decreased.  Very poor prognosis.    Essential hypertension Hypotension on this admission.  Blood pressure improving, resume Coreg  if blood pressure permits.  Recently diagnosed PE.  CTA this admission shows resolution of PE.  No need for Eliquis for the next few days, was very small on previous CTA few weeks ago, if platelet  counts continue to rise stabilize 84-month course of Eliquis total can be considered.  Heel ulcer (HCC)  Not able to stage. Present on admission, Consult wound care team.   Chronic underlying weakness and deconditioning.  Bed and wheelchair bound.  Supportive care.      Condition - Extremely Guarded  Family Communication  : Daughter bedside on 01/13/2024  Code Status : Full code  Consults  : Nephrology, PCCM  PUD Prophylaxis :     Procedures  :     TTE - 1. Left ventricular ejection fraction, by estimation, is 60 to 65%. The left ventricle has normal function. The left ventricle has no regional wall motion abnormalities. There is mild concentric left ventricular hypertrophy. Left ventricular diastolic parameters are consistent with Grade I diastolic dysfunction (impaired relaxation).  2. Right ventricular systolic function is normal. The right ventricular size is normal. There is normal pulmonary artery systolic pressure. The estimated right ventricular systolic pressure is 31.3 mmHg.  3. The mitral valve is degenerative. No evidence of mitral valve regurgitation. No evidence of mitral stenosis. Moderate mitral annular calcification.  4. The aortic valve is tricuspid. There is mild calcification of the aortic valve. Aortic valve regurgitation is trivial. Aortic valve sclerosis/calcification is present, without any evidence of aortic stenosis.  5. The inferior vena cava is normal in size with greater than 50% respiratory variability, suggesting right atrial pressure of 3 mmHg.      Disposition Plan  :    Status is: Inpatient  DVT Prophylaxis  :    heparin injection 5,000 Units Start: 01/12/24 1400 Place and maintain sequential compression device Start: 01/11/24 1543 SCDs Start: 01/11/24 3244    Lab Results  Component Value Date   PLT 65 (L) 01/13/2024    Diet :  Diet Order             Diet renal with fluid restriction Fluid restriction: 1200 mL Fluid; Room service appropriate?  Yes; Fluid consistency: Thin  Diet effective now  Inpatient Medications  Scheduled Meds:  [START ON 01/14/2024] carvedilol  3.125 mg Oral BID   Chlorhexidine Gluconate Cloth  6 each Topical Daily   Chlorhexidine Gluconate Cloth  6 each Topical Q0600   heparin injection (subcutaneous)  5,000 Units Subcutaneous Q8H   isosorbide mononitrate  15 mg Oral Daily   leptospermum manuka honey  1 Application Topical Daily   multivitamin  1 tablet Oral Q M,W,F,Sa-1800   pneumococcal 20-valent conjugate vaccine  0.5 mL Intramuscular Tomorrow-1000   sevelamer carbonate  800 mg Oral TID WC   Continuous Infusions:  ampicillin-sulbactam (UNASYN) IV 3 g (01/12/24 1751)   sodium PHOSPHATE IVPB (in mmol)     PRN Meds:.acetaminophen **OR** acetaminophen, midodrine    Objective:   Vitals:   01/12/24 1800 01/12/24 1931 01/12/24 2318 01/13/24 0416  BP: (!) 148/113 139/73 (!) 134/96 (!) 143/103  Pulse: 64 64 63 62  Resp: 14 12 15 14   Temp:  (!) 97.3 F (36.3 C) 97.9 F (36.6 C) (!) 97.5 F (36.4 C)  TempSrc:  Oral Oral Oral  SpO2: 100% 100% 100% 100%  Weight:      Height:        Wt Readings from Last 3 Encounters:  01/11/24 51 kg  12/30/23 48.1 kg  12/23/23 48.1 kg     Intake/Output Summary (Last 24 hours) at 01/13/2024 1017 Last data filed at 01/12/2024 1800 Gross per 24 hour  Intake 292.92 ml  Output --  Net 292.92 ml     Physical Exam  Awake Alert, No new F.N deficits, lower extremities with pravena boots [place].AT,PERRAL Supple Neck, No JVD,   Symmetrical Chest wall movement, Good air movement bilaterally, CTAB RRR,No Gallops,Rubs or new Murmurs,  +ve B.Sounds, Abd Soft, No tenderness,   No Cyanosis, Clubbing or edema     RN pressure injury documentation: Pressure Injury 12/13/23 Heel Left Stage 4 - Full thickness tissue loss with exposed bone, tendon or muscle. (Active)  12/13/23 1224  Location: Heel  Location Orientation: Left  Staging: Stage 4 -  Full thickness tissue loss with exposed bone, tendon or muscle.  Wound Description (Comments):   Present on Admission:   Dressing Type Foam - Lift dressing to assess site every shift;Gauze (Comment);Other (Comment) (silvadene) 12/16/23 2005     Pressure Injury 01/11/24 Sacrum Stage 2 -  Partial thickness loss of dermis presenting as a shallow open injury with a red, pink wound bed without slough. (Active)  01/11/24 0900  Location: Sacrum  Location Orientation:   Staging: Stage 2 -  Partial thickness loss of dermis presenting as a shallow open injury with a red, pink wound bed without slough.  Wound Description (Comments):   Present on Admission: Yes  Dressing Type Foam - Lift dressing to assess site every shift 01/11/24 2000     Pressure Injury 01/11/24 Buttocks Right;Medial Stage 2 -  Partial thickness loss of dermis presenting as a shallow open injury with a red, pink wound bed without slough. (Active)  01/11/24 0900  Location: Buttocks  Location Orientation: Right;Medial  Staging: Stage 2 -  Partial thickness loss of dermis presenting as a shallow open injury with a red, pink wound bed without slough.  Wound Description (Comments):   Present on Admission: Yes  Dressing Type Foam - Lift dressing to assess site every shift 01/11/24 2000     Pressure Injury 01/11/24 Buttocks Left;Medial;Lower Stage 2 -  Partial thickness loss of dermis presenting as a shallow open injury with a red, pink wound  bed without slough. (Active)  01/11/24 0900  Location: Buttocks  Location Orientation: Left;Medial;Lower  Staging: Stage 2 -  Partial thickness loss of dermis presenting as a shallow open injury with a red, pink wound bed without slough.  Wound Description (Comments):   Present on Admission: Yes  Dressing Type Foam - Lift dressing to assess site every shift 01/11/24 2000     Pressure Injury 01/11/24 Heel Left;Posterior;Mid Unstageable - Full thickness tissue loss in which the base of the injury  is covered by slough (yellow, tan, gray, green or brown) and/or eschar (tan, brown or black) in the wound bed. yellow/white slough i (Active)  01/11/24 0900  Location: Heel  Location Orientation: Left;Posterior;Mid  Staging: Unstageable - Full thickness tissue loss in which the base of the injury is covered by slough (yellow, tan, gray, green or brown) and/or eschar (tan, brown or black) in the wound bed.  Wound Description (Comments): yellow/white slough in middle, dark hard black skin surrounding  Present on Admission: Yes  Dressing Type Foam - Lift dressing to assess site every shift 01/11/24 2000      Data Review:    Recent Labs  Lab 01/11/24 0228 01/12/24 1048 01/13/24 0503  WBC 4.0 6.9 7.2  HGB 10.0* 9.2* 9.9*  HCT 32.8* 29.0* 31.4*  PLT 33* 51* 65*  MCV 92.4 89.5 88.2  MCH 28.2 28.4 27.8  MCHC 30.5 31.7 31.5  RDW 16.4* 16.8* 16.5*    Recent Labs  Lab 01/11/24 0228 01/11/24 0432 01/11/24 1622 01/12/24 1048 01/13/24 0502 01/13/24 0503 01/13/24 0748  NA 131*  --   --  130*  --  127*  --   K 3.4*  --   --  3.8  --  3.5  --   CL 98  --   --  100  --  94*  --   CO2 25  --   --  20*  --  22  --   ANIONGAP 8  --   --  10  --  11  --   GLUCOSE 77  --   --  175*  --  108*  --   BUN 10  --   --  16  --  23  --   CREATININE 1.48*  --   --  2.20*  --  2.54*  --   AST 28  --   --   --   --   --   --   ALT 18  --   --   --   --   --   --   ALKPHOS 69  --   --   --   --   --   --   BILITOT 0.6  --   --   --   --   --   --   ALBUMIN 2.5*  --   --   --   --   --   --   CRP  --   --   --   --   --   --  4.4*  PROCALCITON  --  0.72  --   --   --   --   --   LATICACIDVEN  --   --  1.0  --   --   --   --   INR 1.4*  --   --   --   --   --   --   BNP  --   --   --   --  1,016.0*  --   --   MG  --   --   --   --   --  2.0  --   PHOS  --   --   --   --   --  1.5*  --   CALCIUM 8.5*  --   --  8.1*  --  7.9*  --       Recent Labs  Lab 01/11/24 0228 01/11/24 0432  01/11/24 1622 01/12/24 1048 01/13/24 0502 01/13/24 0503 01/13/24 0748  CRP  --   --   --   --   --   --  4.4*  PROCALCITON  --  0.72  --   --   --   --   --   LATICACIDVEN  --   --  1.0  --   --   --   --   INR 1.4*  --   --   --   --   --   --   BNP  --   --   --   --  1,016.0*  --   --   MG  --   --   --   --   --  2.0  --   CALCIUM 8.5*  --   --  8.1*  --  7.9*  --       Micro Results Recent Results (from the past 240 hours)  MRSA Next Gen by PCR, Nasal     Status: None   Collection Time: 01/11/24  3:19 PM   Specimen: Nasal Mucosa; Nasal Swab  Result Value Ref Range Status   MRSA by PCR Next Gen NOT DETECTED NOT DETECTED Final    Comment: (NOTE) The GeneXpert MRSA Assay (FDA approved for NASAL specimens only), is one component of a comprehensive MRSA colonization surveillance program. It is not intended to diagnose MRSA infection nor to guide or monitor treatment for MRSA infections. Test performance is not FDA approved in patients less than 34 years old. Performed at [facility], [address] [place], [place] [address]     Radiology Report DG Chest Port 1 View Result Date: 01/13/2024 CLINICAL DATA:  [id number] with shortness of breath, admitted for aspiration pneumonia left lower lobe. EXAM: PORTABLE CHEST 1 VIEW COMPARISON:  Portable chest 01/11/2024 at 2:31 a.m. FINDINGS: 7:26 a.m. Small bilateral pleural effusions are again noted, grossly improved on the left. Patchy airspace disease is still seen in the left base but is less confluent and less dense today. Scattered opacities in the right lower lung field are unchanged consistent with atelectasis or small areas of pneumonia. There are overlying skin folds on the left with remaining lungs generally clear. The heart is enlarged, stable. No vascular congestion or edema is seen. The mediastinum is unchanged with calcification of the transverse aorta. Osteopenia and degenerative change of the spine. IMPRESSION: 1. Small  bilateral pleural effusions, grossly improved on the left. 2. Improved left basilar airspace disease. 3. Scattered opacities in the right lower lung field are unchanged consistent with atelectasis or small areas of pneumonia. 4. Stable cardiomegaly. 5. Aortic atherosclerosis. Electronically Signed   By: Almira Bar M.D.   On: 01/13/2024 07:52   ECHOCARDIOGRAM LIMITED Result Date: 01/12/2024    ECHOCARDIOGRAM LIMITED REPORT   Patient Name:   ELENER CUSTODIO Date of Exam: 01/12/2024 Medical Rec #:  [id number]      Height:       65.0 in Accession #:  [id number]     Weight:       112.4 lb Date of Birth:  Apr 13, 1951     BSA:          1.549 m Patient Age:    72 years       BP:           166/81 mmHg Patient Gender: F              HR:           69 bpm. Exam Location:  Inpatient Procedure: Limited Echo, Cardiac Doppler and Color Doppler (Both Spectral and            Color Flow Doppler were utilized during procedure). Indications:    Pulmonary hypertension  History:        Patient has prior history of Echocardiogram examinations, most                 recent 06/12/2023. Risk Factors:Hypertension.  Sonographer:    Karma Ganja Referring Phys: [id number] Lorin Glass  Sonographer Comments: Technically challenging study due to limited acoustic windows. Image acquisition challenging due to patient body habitus. IMPRESSIONS  1. Left ventricular ejection fraction, by estimation, is 60 to 65%. The left ventricle has normal function. The left ventricle has no regional wall motion abnormalities. There is mild concentric left ventricular hypertrophy. Left ventricular diastolic parameters are consistent with Grade I diastolic dysfunction (impaired relaxation).  2. Right ventricular systolic function is normal. The right ventricular size is normal. There is normal pulmonary artery systolic pressure. The estimated right ventricular systolic pressure is 31.3 mmHg.  3. The mitral valve is degenerative. No evidence of mitral valve  regurgitation. No evidence of mitral stenosis. Moderate mitral annular calcification.  4. The aortic valve is tricuspid. There is mild calcification of the aortic valve. Aortic valve regurgitation is trivial. Aortic valve sclerosis/calcification is present, without any evidence of aortic stenosis.  5. The inferior vena cava is normal in size with greater than 50% respiratory variability, suggesting right atrial pressure of 3 mmHg. Comparison(s): Prior images reviewed side by side. Left pleural effusion has resolved. FINDINGS  Left Ventricle: Left ventricular ejection fraction, by estimation, is 60 to 65%. The left ventricle has normal function. The left ventricle has no regional wall motion abnormalities. There is mild concentric left ventricular hypertrophy. Left ventricular diastolic parameters are consistent with Grade I diastolic dysfunction (impaired relaxation). Right Ventricle: The right ventricular size is normal. No increase in right ventricular wall thickness. Right ventricular systolic function is normal. There is normal pulmonary artery systolic pressure. The tricuspid regurgitant velocity is 2.66 m/s, and  with an assumed right atrial pressure of 3 mmHg, the estimated right ventricular systolic pressure is 31.3 mmHg. Left Atrium: Left atrial size was normal in size. Right Atrium: Right atrial size was normal in size. Mitral Valve: The mitral valve is degenerative in appearance. Moderate mitral annular calcification. No evidence of mitral valve stenosis. Tricuspid Valve: The tricuspid valve is normal in structure. Tricuspid valve regurgitation is trivial. Aortic Valve: The aortic valve is tricuspid. There is mild calcification of the aortic valve. Aortic valve regurgitation is trivial. Aortic valve sclerosis/calcification is present, without any evidence of aortic stenosis. Pulmonic Valve: The pulmonic valve was grossly normal. Pulmonic valve regurgitation is not visualized. No evidence of pulmonic  stenosis. Aorta: The aortic root and ascending aorta are structurally normal, with no evidence of dilitation. Venous: The inferior vena cava is normal in size with greater than 50% respiratory  variability, suggesting right atrial pressure of 3 mmHg. Additional Comments: Spectral Doppler performed. Color Doppler performed.  LEFT VENTRICLE PLAX 2D LVIDd:         4.30 cm LVIDs:         3.00 cm LV PW:         1.10 cm LV IVS:        1.20 cm LVOT diam:     1.90 cm LVOT Area:     2.84 cm  RIGHT VENTRICLE             IVC RV Basal diam:  4.10 cm     IVC diam: 1.20 cm RV S prime:     15.70 cm/s TAPSE (M-mode): 2.4 cm LEFT ATRIUM         Index       RIGHT ATRIUM           Index LA diam:    4.00 cm 2.58 cm/m  RA Area:     19.40 cm                                 RA Volume:   51.20 ml  33.06 ml/m   AORTA Ao Root diam: 3.20 cm TRICUSPID VALVE TR Peak grad:   28.3 mmHg TR Vmax:        266.00 cm/s  SHUNTS Systemic Diam: 1.90 cm Rachelle Hora Croitoru MD Electronically signed by Thurmon Fair MD Signature Date/Time: 01/12/2024/3:33:09 PM    Final      Signature  -   Susa Raring M.D on 01/13/2024 at 10:17 AM   -  To page go to [email or web address]

## 2024-01-13 NOTE — Progress Notes (Signed)
Patient to HD at this time.

## 2024-01-13 NOTE — Progress Notes (Signed)
 PT Cancellation Note  Patient Details Name: Kara Mullins MRN: [id number] DOB: [birth year]   Cancelled Treatment:    Reason Eval/Treat Not Completed: Patient declined, no reason specified (Pt politely refused PT evaluation despite education on the purpose of session and the benefits mobility would provide. She denies pain and fatigue stating "I don't want to do it" despite encouragement. Will follow-up for Eval as schedule permits.)  Cheri Guppy, PT, DPT Acute Rehabilitation Services Office: [phone] Secure Chat Preferred  Richardson Chiquito 01/13/2024, 10:37 AM

## 2024-01-13 NOTE — Evaluation (Signed)
 Clinical/Bedside Swallow Evaluation Patient Details  Name: Kara Mullins MRN: [id number] Date of Birth: Nov 13, 1950  Today's Date: 01/13/2024 Time: SLP Start Time (ACUTE ONLY): 0930 SLP Stop Time (ACUTE ONLY): 0940 SLP Time Calculation (min) (ACUTE ONLY): 10 min  Past Medical History:  Past Medical History:  Diagnosis Date   Chronic combined systolic and diastolic congestive heart failure (HCC) 11/09/2021   Generalized anxiety disorder    Hyperlipidemia 11/09/2021   Hypertension    Stage 3b chronic kidney disease (CKD) (HCC) 11/09/2021   Past Surgical History:  Past Surgical History:  Procedure Laterality Date   AV FISTULA PLACEMENT Left 03/27/2023   Procedure: LEFT ARM BRACHIOCEPHALIC ARTERIOVENOUS (AV) FISTULA CREATION;  Surgeon: [name], MD;  Location: [facility] OR;  Service: Vascular;  Laterality: Left;   CATARACT EXTRACTION, BILATERAL     combined systolic and diastolic congestive heart failure      INSERTION OF DIALYSIS CATHETER N/A 03/27/2023   Procedure: INSERTION OF RIGHT INTERNAL JUGULAR TUNNELED DIALYSIS CATHETER;  Surgeon: [name], MD;  Location: [facility] OR;  Service: Vascular;  Laterality: N/A;   IR THORACENTESIS ASP PLEURAL SPACE W/IMG GUIDE  06/12/2023   HPI:  Patient is a 73 y.o. female with PMH: ESRD on HD, heart failure, HTN, HLD, dysphagia. She was admitted at the beginning of last month (12/13/23) with AMS. MBS was completed during that admission on 12/15/23 showed grossly functional oropharyngeal swallow but patient's esophagus appearing to be filled with barium and with retrograde flow below the PES. Patient presented to the hospital for current admission on 01/11/24 with dyspnea and FTT. She has had progressive weakness, very poor oral intake, progressively less mobility and worsening dyspnea on exertion. Workup consistent with PNA and she was extremely hypotensive on admission requiring pressors in ICU.    Assessment / Plan / Recommendation  Clinical Impression   Patient is not currently presenting with clinical s/s of dysphagia as per this bedside swallow evaluation. SLP did review MBS that was completed during recent, past admission, which indicated WFL swallow for oral and pharyngeal phases but suspected esophageal phase secondary to a fair amount of barium remaining in esophagus with retrograde flow below PES. During today's evaluation, patient's swallow assessed via sips of thin liquids. Swallow initiation was timely and no overt s/s aspiration. SLP is not recommending further skilled intervention, however recommendation is for esophageal assessment secondary to findings from prior MBS. SLP Visit Diagnosis: Dysphagia, unspecified (R13.10)    Aspiration Risk  Mild aspiration risk    Diet Recommendation Thin liquid;Regular    Liquid Administration via: Cup;Straw Medication Administration: Whole meds with liquid Supervision: Patient able to self feed Compensations: Minimize environmental distractions;Slow rate;Small sips/bites;Follow solids with liquid Postural Changes: Seated upright at 90 degrees;Remain upright for at least 30 minutes after po intake    Other  Recommendations Recommended Consults: Consider esophageal assessment Oral Care Recommendations: Oral care BID    Recommendations for follow up therapy are one component of a multi-disciplinary discharge planning process, led by the attending physician.  Recommendations may be updated based on patient status, additional functional criteria and insurance authorization.  Follow up Recommendations No SLP follow up      Assistance Recommended at Discharge    Functional Status Assessment Patient has not had a recent decline in their functional status  Frequency and Duration   N/A         Prognosis   N/A     Swallow Study   General Date of Onset: 01/11/24 HPI: Patient  is a 73 y.o. female with PMH: ESRD on HD, heart failure, HTN, HLD, dysphagia. She was admitted at the beginning of last  month (12/13/23) with AMS. MBS was completed during that admission on 12/15/23 showed grossly functional oropharyngeal swallow but patient's esophagus appearing to be filled with barium and with retrograde flow below the PES. Patient presented to the hospital for current admission on 01/11/24 with dyspnea and FTT. She has had progressive weakness, very poor oral intake, progressively less mobility and worsening dyspnea on exertion. Workup consistent with PNA and she was extremely hypotensive on admission requiring pressors in ICU. Type of Study: Bedside Swallow Evaluation Previous Swallow Assessment: see HPI, MBS 12/15/23 Diet Prior to this Study: Regular;Thin liquids (Level 0) Temperature Spikes Noted: No Respiratory Status: Nasal cannula History of Recent Intubation: No Behavior/Cognition: Alert;Cooperative;Pleasant mood Oral Cavity Assessment: Within Functional Limits Oral Care Completed by SLP: No Oral Cavity - Dentition: Edentulous Vision: Functional for self-feeding Self-Feeding Abilities: Able to feed self Patient Positioning: Upright in bed Baseline Vocal Quality: Normal Volitional Cough: Congested;Weak Volitional Swallow: Able to elicit    Oral/Motor/Sensory Function Overall Oral Motor/Sensory Function: Within functional limits   Ice Chips     Thin Liquid Thin Liquid: Within functional limits Presentation: Straw;Self Fed    Nectar Thick     Honey Thick     Puree Puree: Not tested   Solid     Solid: Not tested     Angela Nevin, MA, CCC-SLP Speech Therapy

## 2024-01-14 DIAGNOSIS — J690 Pneumonitis due to inhalation of food and vomit: Secondary | ICD-10-CM | POA: Diagnosis not present

## 2024-01-14 LAB — CBC WITH DIFFERENTIAL/PLATELET
Abs Immature Granulocytes: 0.02 10*3/uL (ref 0.00–0.07)
Basophils Absolute: 0 10*3/uL (ref 0.0–0.1)
Basophils Relative: 0 %
Eosinophils Absolute: 0.1 10*3/uL (ref 0.0–0.5)
Eosinophils Relative: 1 %
HCT: 26.8 % — ABNORMAL LOW (ref 36.0–46.0)
Hemoglobin: 8.8 g/dL — ABNORMAL LOW (ref 12.0–15.0)
Immature Granulocytes: 0 %
Lymphocytes Relative: 16 %
Lymphs Abs: 0.8 10*3/uL (ref 0.7–4.0)
MCH: 28.7 pg (ref 26.0–34.0)
MCHC: 32.8 g/dL (ref 30.0–36.0)
MCV: 87.3 fL (ref 80.0–100.0)
Monocytes Absolute: 0.3 10*3/uL (ref 0.1–1.0)
Monocytes Relative: 6 %
Neutro Abs: 3.9 10*3/uL (ref 1.7–7.7)
Neutrophils Relative %: 77 %
Platelets: 72 10*3/uL — ABNORMAL LOW (ref 150–400)
RBC: 3.07 MIL/uL — ABNORMAL LOW (ref 3.87–5.11)
RDW: 16.8 % — ABNORMAL HIGH (ref 11.5–15.5)
WBC: 5.1 10*3/uL (ref 4.0–10.5)
nRBC: 0 % (ref 0.0–0.2)

## 2024-01-14 LAB — BASIC METABOLIC PANEL WITH GFR
Anion gap: 7 (ref 5–15)
BUN: 13 mg/dL (ref 8–23)
CO2: 26 mmol/L (ref 22–32)
Calcium: 7.5 mg/dL — ABNORMAL LOW (ref 8.9–10.3)
Chloride: 96 mmol/L — ABNORMAL LOW (ref 98–111)
Creatinine, Ser: 1.8 mg/dL — ABNORMAL HIGH (ref 0.44–1.00)
GFR, Estimated: 30 mL/min — ABNORMAL LOW (ref >60)
Glucose, Bld: 107 mg/dL — ABNORMAL HIGH (ref 70–99)
Potassium: 3.4 mmol/L — ABNORMAL LOW (ref 3.5–5.1)
Sodium: 129 mmol/L — ABNORMAL LOW (ref 135–145)

## 2024-01-14 LAB — GLUCOSE, CAPILLARY
Glucose-Capillary: 49 mg/dL — ABNORMAL LOW (ref 70–99)
Glucose-Capillary: 119 mg/dL — ABNORMAL HIGH (ref 70–99)
Glucose-Capillary: 42 mg/dL — CL (ref 70–99)
Glucose-Capillary: 46 mg/dL — ABNORMAL LOW (ref 70–99)
Glucose-Capillary: 108 mg/dL — ABNORMAL HIGH (ref 70–99)
Glucose-Capillary: 85 mg/dL (ref 70–99)
Glucose-Capillary: 145 mg/dL — ABNORMAL HIGH (ref 70–99)

## 2024-01-14 LAB — MAGNESIUM: Magnesium: 1.9 mg/dL (ref 1.7–2.4)

## 2024-01-14 LAB — PHOSPHORUS: Phosphorus: 1.2 mg/dL — ABNORMAL LOW (ref 2.5–4.6)

## 2024-01-14 LAB — BRAIN NATRIURETIC PEPTIDE: B Natriuretic Peptide: 926.2 pg/mL — ABNORMAL HIGH (ref 0.0–100.0)

## 2024-01-14 MED ORDER — SODIUM PHOSPHATES 45 MMOLE/15ML IV SOLN
15.0000 mmol | Freq: Once | INTRAVENOUS | Status: AC
  Administered 2024-01-14: 15 mmol via INTRAVENOUS
  Filled 2024-01-14: qty 5

## 2024-01-14 MED ORDER — MIRTAZAPINE 15 MG PO TABS
7.5000 mg | ORAL_TABLET | Freq: Every day | ORAL | Status: DC
  Administered 2024-01-15: 7.5 mg via ORAL
  Filled 2024-01-14: qty 1

## 2024-01-14 MED ORDER — DEXTROSE 50 % IV SOLN
INTRAVENOUS | Status: AC
  Administered 2024-01-14: 25 g via INTRAVENOUS
  Filled 2024-01-14: qty 50

## 2024-01-14 MED ORDER — CHLORHEXIDINE GLUCONATE CLOTH 2 % EX PADS
6.0000 | MEDICATED_PAD | Freq: Every day | CUTANEOUS | Status: DC
  Administered 2024-01-16 – 2024-01-18 (×3): 6 via TOPICAL

## 2024-01-14 MED ORDER — DEXTROSE 50 % IV SOLN
25.0000 g | INTRAVENOUS | Status: AC
Start: 2024-01-14 — End: 2024-01-14

## 2024-01-14 MED ORDER — K PHOS MONO-SOD PHOS DI & MONO 155-852-130 MG PO TABS: 500.0000 mg | ORAL_TABLET | Freq: Three times a day (TID) | ORAL | Status: DC

## 2024-01-14 MED ORDER — PREDNISONE 20 MG PO TABS
20.0000 mg | ORAL_TABLET | Freq: Every day | ORAL | Status: DC
  Administered 2024-01-14 – 2024-01-18 (×5): 20 mg via ORAL
  Filled 2024-01-14 (×5): qty 1

## 2024-01-14 MED ORDER — K PHOS MONO-SOD PHOS DI & MONO 155-852-130 MG PO TABS: 500.0000 mg | ORAL_TABLET | ORAL | Status: DC

## 2024-01-14 MED ORDER — GLUCOSE 40 % PO GEL: 2.0000 | ORAL | Status: AC

## 2024-01-14 NOTE — Care Management Important Message (Signed)
 Important Message  Patient Details  Name: Kara Mullins MRN: [id number] Date of Birth: December 19, 1950   Important Message Given:  Yes - Medicare IM     Dorena Bodo 01/14/2024, 3:35 PM

## 2024-01-14 NOTE — Evaluation (Signed)
 Occupational Therapy Evaluation Patient Details Name: Kara Mullins MRN: [id number] DOB: 04-01-1951 Today's Date: 01/14/2024   History of Present Illness   Pt is 73 y.o. female admitted 01/11/24 for dyspnea and failure to thrive. Chest CT with left lower lobe infiltrate suggestive of aspiration.  Found to be extremely hypotensive required pressors. PMH significant for ESRD on HD, heart failure, HTN, HLD. Of note, recent hospitalization 3/1-3/4 for Influenza A infection with bacterial pneumonia superinfection.     Clinical Impressions Pt reports having assist at baseline from family for ADLs, and has been using Stedy at home for pivot tranfers/mobility. Pt lives at home with her daughter and son-in-law, uses transportation services to HD. Pt currently needing set up - max A for ADLs and mod +2 for standing in stedy frame. Pt with incr difficulty bending knees to stand from low surface, pt able to stand x3 min in stedy frame for pericare and pad change. Pt presenting with impairments listed below, will follow acutely. Recommend HHOT at d/c given family can continue to provide level of assist needed.      If plan is discharge home, recommend the following:   A lot of help with walking and/or transfers;A lot of help with bathing/dressing/bathroom;Assistance with cooking/housework;Help with stairs or ramp for entrance;Assist for transportation     Functional Status Assessment   Patient has had a recent decline in their functional status and demonstrates the ability to make significant improvements in function in a reasonable and predictable amount of time.     Equipment Recommendations   None recommended by OT     Recommendations for Other Services   PT consult     Precautions/Restrictions   Precautions Precautions: Fall Recall of Precautions/Restrictions: Intact Restrictions Weight Bearing Restrictions Per Provider Order: No     Mobility Bed Mobility                General bed mobility comments: OOB in chair upon arrival an departure    Transfers Overall transfer level: Needs assistance Equipment used: Ambulation equipment used Transfers: Sit to/from Stand, Bed to chair/wheelchair/BSC Sit to Stand: Via lift equipment, +2 physical assistance, Mod assist           General transfer comment: pt with difficulty bending knees to stand from low chair surface, needing mod +2 to pull up to standing Transfer via Lift Equipment: Stedy    Balance Overall balance assessment: Needs assistance Sitting-balance support: Bilateral upper extremity supported, Feet supported Sitting balance-Leahy Scale: Fair     Standing balance support: Bilateral upper extremity supported, During functional activity Standing balance-Leahy Scale: Poor Standing balance comment: Pt depedent on BUE support on stedy. She demonstrated fwd flex trunk.                           ADL either performed or assessed with clinical judgement   ADL Overall ADL's : Needs assistance/impaired Eating/Feeding: Set up;Sitting   Grooming: Set up;Sitting   Upper Body Bathing: Moderate assistance;Sitting   Lower Body Bathing: Maximal assistance;Sitting/lateral leans   Upper Body Dressing : Moderate assistance;Sitting Upper Body Dressing Details (indicate cue type and reason): donning gown seated in chair Lower Body Dressing: Maximal assistance;Sitting/lateral leans;Bed level   Toilet Transfer: Moderate assistance;+2 for physical assistance   Toileting- Clothing Manipulation and Hygiene: Total assistance Toileting - Clothing Manipulation Details (indicate cue type and reason): posterior pericare     Functional mobility during ADLs: Moderate assistance;+2 for physical assistance  Vision   Vision Assessment?: No apparent visual deficits     Perception Perception: Not tested       Praxis Praxis: Not tested       Pertinent Vitals/Pain Pain Assessment Pain  Assessment: No/denies pain     Extremity/Trunk Assessment Upper Extremity Assessment Upper Extremity Assessment: Generalized weakness   Lower Extremity Assessment Lower Extremity Assessment: Defer to PT evaluation   Cervical / Trunk Assessment Cervical / Trunk Assessment: Kyphotic   Communication Communication Communication: No apparent difficulties   Cognition Arousal: Alert Behavior During Therapy: WFL for tasks assessed/performed                                 Following commands: Intact       Cueing  General Comments   Cueing Techniques: Verbal cues;Gestural cues  VSS   Exercises     Shoulder Instructions      Home Living Family/patient expects to be discharged to:: Private residence Living Arrangements: Children (daughter and son in law) Available Help at Discharge: Family;Available 24 hours/day Type of Home: House Home Access: Ramped entrance     Home Layout: One level     Bathroom Shower/Tub: Sponge bathes at baseline;Tub/shower unit   Bathroom Toilet: Handicapped height Bathroom Accessibility: Yes   Home Equipment: Rollator (4 wheels);Wheelchair - manual;BSC/3in1;Other (comment);Cane - single point;Hospital bed;Lift chair Corene Cornea)          Prior Functioning/Environment Prior Level of Function : Needs assist             Mobility Comments: Pt transfers via the stedy with family assist and reports it can fit in/out of the bathroom. She spends most her day in a recliner chair. She mobilizes using a w/c relying on family to push her. Denies fall history. ADLs Comments: Pt is able to feel herself and do her hair. She reports she can assist with UB dressing but needs family for LB dressing. Daughter helps with bathing, toileting (wears depends) medications, finances, transportation, and household management.    OT Problem List: Decreased strength;Decreased range of motion;Decreased activity tolerance;Impaired balance (sitting  and/or standing);Decreased safety awareness   OT Treatment/Interventions: Self-care/ADL training;Therapeutic exercise;Energy conservation;DME and/or AE instruction;Therapeutic activities;Patient/family education;Balance training      OT Goals(Current goals can be found in the care plan section)   Acute Rehab OT Goals Patient Stated Goal: none stated OT Goal Formulation: With patient Time For Goal Achievement: 01/28/24 Potential to Achieve Goals: Good ADL Goals Pt Will Perform Grooming: with set-up;sitting Pt Will Perform Upper Body Dressing: with contact guard assist;sitting Pt Will Transfer to Toilet: with contact guard assist;bedside commode Additional ADL Goal #1: pt will complete bed mobility min A in prep for seated ADLs   OT Frequency:  Min 2X/week    Co-evaluation              AM-PAC OT "6 Clicks" Daily Activity     Outcome Measure Help from another person eating meals?: A Little Help from another person taking care of personal grooming?: A Little Help from another person toileting, which includes using toliet, bedpan, or urinal?: A Lot Help from another person bathing (including washing, rinsing, drying)?: A Lot Help from another person to put on and taking off regular upper body clothing?: A Lot Help from another person to put on and taking off regular lower body clothing?: A Lot 6 Click Score: 14   End of Session Equipment Utilized During  Treatment: Gait belt;Other (comment) (stedy) Nurse Communication: Mobility status (bed needs to be made for pt to return to bed)  Activity Tolerance: Patient tolerated treatment well Patient left: in chair;with call bell/phone within reach;with chair alarm set  OT Visit Diagnosis: Unsteadiness on feet (R26.81);Muscle weakness (generalized) (M62.81);Other abnormalities of gait and mobility (R26.89)                Time: 2130-8657 OT Time Calculation (min): 23 min Charges:  OT General Charges $OT Visit: 1 Visit OT  Evaluation $OT Eval Moderate Complexity: 1 Mod OT Treatments $Self Care/Home Management : 8-22 mins  Carver Fila, OTD, OTR/L SecureChat Preferred Acute Rehab [phone]   [name] 01/14/2024, 3:55 PM

## 2024-01-14 NOTE — Progress Notes (Signed)
   01/14/24 1532  Spiritual Encounters  Type of Visit Initial  Care provided to: Patient  Reason for visit Advance directives  OnCall Visit No   Chaplain Tobi Bastos provided AD doc and education to Pt. Chaplain Alvino Chapel got completed AD notarized for Pt.   Chaplain Daron Offer

## 2024-01-14 NOTE — Evaluation (Signed)
 Physical Therapy Evaluation Patient Details Name: Kara Mullins MRN: [id number] DOB: 1951/09/25 Today's Date: 01/14/2024  History of Present Illness  Pt is 73 y.o. female admitted 01/11/24 for dyspnea and failure to thrive. Chest CT with left lower lobe infiltrate suggestive of aspiration.  Found to be extremely hypotensive required pressors. PMH significant for ESRD on HD, heart failure, HTN, HLD. Of note, recent hospitalization 3/1-3/4 for Influenza A infection with bacterial pneumonia superinfection.   Clinical Impression  Pt admitted with above diagnosis. PTA, pt required assistance with functional mobility, ADLs, and IADLs. She slept in a hospital, utilized a sara stedy to transfer, and mobilized in a manual w/c with her family pushing her. Pt reports a sedentary lifestyle spending most of her time in a recliner chair. She lives with her daughter in a one level house with a ramp entrance. Pt reports she has 24/7 supervision and assistance from family. Pt currently with functional limitations due to the deficits listed below (see PT Problem List). She required modA for rolling, modA x2 for supine>sit, and minA x2 for sit-to-stand using stedy. Pt quickly fatigued following extensive time dedicated to pericare/hygiene. Pt will benefit from acute skilled PT to maximize independence with functional mobility, decrease caregiver burden, and allow safe d/c Home with HHPT.      If plan is discharge home, recommend the following: A lot of help with bathing/dressing/bathroom;Two people to help with walking and/or transfers;Assistance with cooking/housework;Direct supervision/assist for medications management;Direct supervision/assist for financial management;Assist for transportation;Help with stairs or ramp for entrance   Can travel by private vehicle        Equipment Recommendations None recommended by PT (Pt already has DME)  Recommendations for Other Services       Functional Status Assessment  Patient has had a recent decline in their functional status and/or demonstrates limited ability to make significant improvements in function in a reasonable and predictable amount of time     Precautions / Restrictions Precautions Precautions: Fall Recall of Precautions/Restrictions: Intact Restrictions Weight Bearing Restrictions Per Provider Order: No      Mobility  Bed Mobility Overal bed mobility: Needs Assistance Bed Mobility: Rolling, Supine to Sit Rolling: Used rails, Mod assist   Supine to sit: Mod assist, +2 for physical assistance, HOB elevated     General bed mobility comments: Pt greeted and found to be soiled in BM. Extensive time dedicated to hygiene. She rolled R/L with VC for sequencing and modA to turn. She maintained sidelying using rail and CGA for a prolonged period of time for pericare to be addressed. Pt sat up on R side of bed with pivot technique NT managing BLE and PT managing trunk d/t fatigue from rolling.    Transfers Overall transfer level: Needs assistance Equipment used: Ambulation equipment used Transfers: Sit to/from Stand, Bed to chair/wheelchair/BSC Sit to Stand: Via lift equipment, Min assist, +2 physical assistance, From elevated surface, Contact guard assist, +2 safety/equipment           General transfer comment: Pt stood from slightly raised bed height to allow stedy to fit underneath bed. She demonstrated proper positioning without cueing. Pt powered up with minAx2. VC/TC to tuck hips underneath her in order for stedy seat flaps to be put underneath her. During transfer to recliner chair NT managed lines/leads. Pt stood from steady seat height with CGA to power up. She demonstrated good eccentric control with sitting. Did not attempt transfer without stedy d/t pt's fatigue following hygiene. Transfer via Lift Equipment: [employer]  Ambulation/Gait                  Stairs            Wheelchair Mobility     Tilt Bed     Modified Rankin (Stroke Patients Only)       Balance Overall balance assessment: Needs assistance Sitting-balance support: Bilateral upper extremity supported, Feet supported Sitting balance-Leahy Scale: Fair Sitting balance - Comments: Pt sat EOB with supervision. She kept BUE support on bed or steady, without LOB.   Standing balance support: Bilateral upper extremity supported, During functional activity Standing balance-Leahy Scale: Poor Standing balance comment: Pt depedent on BUE support on stedy. She demonstrated fwd flex trunk.                             Pertinent Vitals/Pain Pain Assessment Pain Assessment: No/denies pain    Home Living Family/patient expects to be discharged to:: Private residence Living Arrangements: Children Available Help at Discharge: Family;Available 24 hours/day Type of Home: House Home Access: Ramped entrance       Home Layout: One level Home Equipment: Rollator (4 wheels);Wheelchair - manual;BSC/3in1;Other (comment);Cane - single point;Hospital bed;Lift chair (sara stedy)      Prior Function Prior Level of Function : Needs assist       Physical Assist : Mobility (physical);ADLs (physical) Mobility (physical): Bed mobility;Transfers;Gait ADLs (physical): Bathing;Dressing;Toileting;IADLs Mobility Comments: Pt transfers via the stedy with family assist and reports it can fit in/out of the bathroom. She spends most her day in a recliner chair. She mobilizes using a w/c relying on family to push her. Denies fall history. ADLs Comments: Pt is able to feel herself and do her hair. She reports she can assist with UB dressing but needs family for LB dressing. Daughter helps with bathing, toileting, medications, finances, transportation, and household management.     Extremity/Trunk Assessment   Upper Extremity Assessment Upper Extremity Assessment: Defer to OT evaluation    Lower Extremity Assessment Lower Extremity  Assessment: Generalized weakness    Cervical / Trunk Assessment Cervical / Trunk Assessment: Kyphotic;Other exceptions Cervical / Trunk Exceptions: Frail  Communication   Communication Communication: No apparent difficulties    Cognition Arousal: Alert Behavior During Therapy: WFL for tasks assessed/performed   PT - Cognitive impairments: No family/caregiver present to determine baseline                       PT - Cognition Comments: Pt A,Ox4 Following commands: Intact       Cueing Cueing Techniques: Verbal cues, Gestural cues     General Comments General comments (skin integrity, edema, etc.): VSS on RA. Called for NT to assist with hygiene as pt was found soiled in BM.    Exercises     Assessment/Plan    PT Assessment Patient needs continued PT services  PT Problem List Decreased strength;Decreased range of motion;Decreased activity tolerance;Decreased balance;Decreased mobility;Decreased skin integrity       PT Treatment Interventions DME instruction;Gait training;Functional mobility training;Therapeutic activities;Therapeutic exercise;Balance training;Patient/family education    PT Goals (Current goals can be found in the Care Plan section)  Acute Rehab PT Goals Patient Stated Goal: Return Home PT Goal Formulation: With patient Time For Goal Achievement: 01/28/24 Potential to Achieve Goals: Fair    Frequency Min 1X/week     Co-evaluation               AM-PAC PT "6  Clicks" Mobility  Outcome Measure Help needed turning from your back to your side while in a flat bed without using bedrails?: A Lot Help needed moving from lying on your back to sitting on the side of a flat bed without using bedrails?: Total Help needed moving to and from a bed to a chair (including a wheelchair)?: Total Help needed standing up from a chair using your arms (e.g., wheelchair or bedside chair)?: Total Help needed to walk in hospital room?: Total Help needed  climbing 3-5 steps with a railing? : Total 6 Click Score: 7    End of Session Equipment Utilized During Treatment: Gait belt Activity Tolerance: Patient tolerated treatment well;Patient limited by fatigue Patient left: in chair;with call bell/phone within reach;with chair alarm set Nurse Communication: Mobility status;Other (comment) (Sacral pad needs to be replaced. Recommend pt sit up for a <2 hours d/t skin integrity.) PT Visit Diagnosis: Muscle weakness (generalized) (M62.81);Other abnormalities of gait and mobility (R26.89);Adult, failure to thrive (R62.7)    Time: 1133-1210 PT Time Calculation (min) (ACUTE ONLY): 37 min   Charges:   PT Evaluation $PT Eval Moderate Complexity: 1 Mod PT Treatments $Therapeutic Activity: 8-22 mins PT General Charges $$ ACUTE PT VISIT: 1 Visit         Cheri Guppy, PT, DPT Acute Rehabilitation Services Office: [phone] Secure Chat Preferred  Richardson Chiquito 01/14/2024, 1:09 PM

## 2024-01-14 NOTE — Progress Notes (Signed)
 This chaplain responded to PMT NP-Mary's consult for creating the Pt. Advance Directive:  HCPOA only. The Pt. is not completing a Living Will. Family is not at the bedside.  The Pt. has the AD filled out at the time of the visit. The Pt. is able to answer clarifying questions about her HCPOA.  The Pt. is choosing Kara Mullins as her healthcare agent. If this person is unable or unwilling to serve in this role the Pt. next choice is [name].  The chaplain is present with the Pt., notary and witnesses for the notarizing of the Pt. AD:  HCPOA only.   The chaplain gave the Pt. the original AD along with two copies. The chaplain scanned the Pt. AD into the Pt. EMR.  The chaplain is available for F/U spiritual care as needed.  Chaplain Stephanie Acre [phone]

## 2024-01-14 NOTE — Progress Notes (Incomplete)
 Nutrition Follow-up  DOCUMENTATION CODES:   Non-severe (moderate) malnutrition in context of chronic illness  INTERVENTION:  Ensure Enlive po BID, each supplement provides 350 kcal and 20 grams of protein.  Magic cup TID with meals, each supplement provides 290 kcal and 9 grams of protein Continue prednisone and mirtazapine in an effort to stimulate appetite Assess for appropriateness of Cortrak placement if within patient GOC Liberalize diet to encourage PO intake  Add renal MVI as she is at increased risk for micronutrient deficiency   NUTRITION DIAGNOSIS:  Severe Malnutrition related to chronic illness (ESRD-HD and CHF exacerbated by flu/PNA) as evidenced by severe fat depletion, severe muscle depletion.  GOAL:  Patient will meet greater than or equal to 90% of their needs  MONITOR:   PO intake, Supplement acceptance, Labs  REASON FOR ASSESSMENT:  Consult Assessment of nutrition requirement/status  ASSESSMENT:   Pt with PMH: ESRD-HD, HTN, HLD who presented with dyspnea and failure to thrive. Recent admission from 3/1-3/4 for influenza A and PNA.  Previous Admission 3/1 admitted with flu A/PNA 3/4 discharged home 3/8 ED visit for dyspnea, CT chest: positive for PE Current Admission 3/01 admitted; required pressors in ICU 4/1 transfer out of ICU to the floor  Lives with daughter who is also her caregiver. Patient is wheelchair dependent. She was recently hospitalized from 3/1-3/4 for influenza A and bacterial PNA. No family at bedside during time of visit. Pt groggy during interview. Did state she feels like appetite is picking up. Unable to endorse usual intake for one day. She states no difficulties chewing or swallowing or with N/V/C/D.  Average Meal Intake 4/3: 50% x1 documented meal  Per MD note, daughter reports poor appetite/intake for some time as well as 70lbs weight loss over last year. This is 39% wt loss, which is considered clinically significant for the  time frame. Per chart review, she is with 11kg weight loss in last six months. This 18% wt loss is also considered clinicalyl significant. Diet has been liberalized to encourage intake. A1c significantly low, which is suggestive of endorsed poor intake.   Given that her protein and calorie needs are elevated 2/2 malnutrition, ESRD-HD dx, and presence of skin breakdown, it is likely that she is not consistently or sufficiently meeting her needs. Will add supplementation to try to increase intake in the presence of poor appetite. Discussed this with patient. Will monitor efficacy of steroid and appetite stimulant, however may benefit from artificial nutrition support if within GOC.   Admit/Current Weight: 50kg EDW: 45.5kg  Mild edema to BLEs noted on exam. Anuric. Did achieve 2.7L UF during most recent HD tx. BPs remain soft on dialysis days.   Pt with prednisone and mirtazapine added to aid in encouraging intake. CRP trending down. Hyponatremia continues as a result of low volume. Has required phosphorus repletion. Binders discontinued. Also received D10 and albumin. Remains on 3K bath with marginally low potassium.  Drains/Lines: LAVF: + bruit/thrill HD UF: 2.7L on 4/3  Meds: mirtazapine, renal MVI, prednisone, sevelamer carbonate, IV ABX Drips:  25g albumin x1 15 mmol Na PHOS x2 IV ABX  Labs:  Na+ 130>127>129 (L) K+ 3.5>3.4 (L) PHOS 1.5>1.2 (L) CRP 10.9>8.8>4.4 (H) CBGs 107-108 x24 hours A1c 4.1 (01/2024)   NUTRITION - FOCUSED PHYSICAL EXAM: Flowsheet Row Most Recent Value  Orbital Region Moderate depletion  Upper Arm Region Severe depletion  Thoracic and Lumbar Region Severe depletion  Buccal Region Severe depletion  Temple Region Moderate depletion  Clavicle Bone Region Severe  depletion  Clavicle and Acromion Bone Region Severe depletion  Scapular Bone Region Severe depletion  Dorsal Hand Moderate depletion  Patellar Region Severe depletion  Anterior Thigh Region Severe  depletion  Posterior Calf Region Severe depletion  Edema (RD Assessment) Mild  Hair Reviewed  Eyes Unable to assess  Mouth Reviewed  Skin Reviewed  Nails Reviewed    Diet Order:   Diet Order             Diet renal with fluid restriction Fluid restriction: 1200 mL Fluid; Room service appropriate? Yes; Fluid consistency: Thin  Diet effective now            EDUCATION NEEDS:   Not appropriate for education at this time  Skin:  Skin Assessment: Skin Integrity Issues: Skin Integrity Issues:: Stage IV, Stage II Stage II: L buttocks and sacrum Stage IV: L heel  Last BM:  3/30 - PTA  Height:  Ht Readings from Last 1 Encounters:  01/11/24 5\' 5"  (1.651 m)   Weight:  Wt Readings from Last 1 Encounters:  01/13/24 50 kg   Ideal Body Weight:  56.8 kg  BMI:  Body mass index is 18.34 kg/m.  Estimated Nutritional Needs:   Kcal:  1600-1800kcal  Protein:  85-100g  Fluid:  1L + UOP  Myrtie Cruise MS, RD, LDN Registered Dietitian Clinical Nutrition RD Inpatient Contact Info in Amion

## 2024-01-14 NOTE — Progress Notes (Addendum)
 [facility] Progress Note  Subjective:  Seen in room BP's dropped into 80s w/ HD yest UF 1800 w/ HD yest  Vitals:   01/14/24 0000 01/14/24 0200 01/14/24 0400 01/14/24 0939  BP:   122/76 109/78  Pulse: (!) 58 (!) 57 (!) 58 (!) 59  Resp: (!) 0 12 11   Temp:   97.7 F (36.5 C) 97.8 F (36.6 C)  TempSrc:   Oral Oral  SpO2: 98% 98% 98%   Weight:      Height:        Exam: General: Frail elderly female, on RA Lungs: Clear bilaterally to auscultation  Heart: S1, S2 2/6 systolic M. No R/G Abdomen: NABS, NT Lower extremities: no LE edema Neuro: Alert and oriented X 2 Dialysis Access: L AVF + T/B   OP HD: [facility]  3h  B350   45.5kg  3K bath  AVF  LUE   Heparin 4000 - Mircera 30 mcg IV Q 4 weeks (last dose 12/30/2023)   CXR 3/30 - IMPRESSION: Small moderate left greater than right pleural effusions similar to 12/20/2023. Basilar airspace opacities may be due to atelectasis or pneumonia.   Assessment/Plan:  Aspiration PNA - bilat LL infiltrates per CT. Hypothermia resolved, on IV abx. Room air at 99%.  Hypophosphatemia - phos 1.2 this am, will give 15 mmol IV sod phos  Brief PEA Arrest-short round of chest compressions, not intubated.  Shock- resolved now, BP's 140/80.  ESRD - TTS HD. Next HD tomorrow.   Volume  - BP soft. No excess volume by exam. 1.8L UF w/ last HD.   Anemia  - HGB 10.0. Follow HGB.   Metabolic bone disease - CCa in range, cont binders w/ meals.   Nutrition - Very low albumin. Protein supplements H/O PE on apixaban  Kara Moselle  MD  [facility] 01/14/2024, 12:45 PM  Recent Labs  Lab 01/11/24 0228 01/12/24 1048 01/13/24 0503 01/14/24 0506  HGB 10.0*   < > 9.9* 8.8*  ALBUMIN 2.5*  --   --   --   CALCIUM 8.5*   < > 7.9* 7.5*  PHOS  --   --  1.5* 1.2*  CREATININE 1.48*   < > 2.54* 1.80*  K 3.4*   < > 3.5 3.4*   < > = values in this interval not displayed.    Inpatient medications:  carvedilol  3.125 mg Oral BID   Chlorhexidine  Gluconate Cloth  6 each Topical Daily   Chlorhexidine Gluconate Cloth  6 each Topical Q0600   dextrose  2 Tube Oral STAT   heparin injection (subcutaneous)  5,000 Units Subcutaneous Q8H   isosorbide mononitrate  15 mg Oral Daily   leptospermum manuka honey  1 Application Topical Daily   multivitamin  1 tablet Oral Q M,W,F,Sa-1800   pneumococcal 20-valent conjugate vaccine  0.5 mL Intramuscular Tomorrow-1000   sevelamer carbonate  800 mg Oral TID WC    ampicillin-sulbactam (UNASYN) IV 3 g (01/12/24 1751)   sodium PHOSPHATE IVPB (in mmol)     acetaminophen **OR** acetaminophen, hydrALAZINE, midodrine

## 2024-01-14 NOTE — Progress Notes (Signed)
 PROGRESS NOTE                                                                                                                                                                                                             Patient Demographics:    Kara Mullins, is a 73 y.o. female, DOB - 05-22-1951, WUJ:[id number]  Outpatient Primary MD for the patient is [name], [name], NP    LOS - 3  Admit date - 01/11/2024    Chief Complaint  Patient presents with   Shortness of Breath       Brief Narrative (HPI from H&P)     73 y.o. female with medical history significant of ESRD on HD, heart failure, hypertension and hyperlipidemia who presented with dyspnea and failure to thrive.  She lives with her daughter who is her caregiver. Patient was recently hospitalized 03/1 to 12/16/23 for influenza A infection with bacterial pneumonia superinfection. She was treated with oseltamivir and antibiotic therapy.  Patient at home continue to be very weak and deconditioned. Very poor oral intake, progressive less mobility and worsening dyspnea on exertion.  She has been using a wheelchair for over one year.    03/08 ED visit for dyspnea, CT chest was done, and resulted positive for pulmonary embolism, subsegmental right upper lobe. Small thromboembolic burden. Placed on apixaban for anticoagulation,    03/11 Cardiology follow up recommended to continue furosemide on non HD days.     At home her daughter noted patient's blood pressure bing low over the last 6 days, on 03/25 HD her daughter was told that her mother's blood pressure was not low. Yesterday patient had dialysis, when reached home she continue very weak and deconditioned.  He was brought to the hospital for weakness, hypotension and shortness of breath.  Workup consistent with pneumonia, she was found to be extremely hypotensive required pressors in ICU, stabilized transferred to my care  on 01/13/2024.   Subjective:    [name] today hypoglycemia this morning resolved with orange juice, she is with poor appetite as discussed with daughter at bedside   Assessment  & Plan :   Aspiration pneumonia of left lower lobe (HCC) - patient very weak and deconditioned. Chest CT with left lower lobe infiltrate suggestive aspiration,, completing 5 days of Unasyn, speech therapy following.  Pneumonia clinically much improved.  Hypotension caused by severe dehydration, decreased oral intake, diuretic dose unknown HD days -she was admitted to ICU, received gentle IV fluids, along with IV hydrocortisone, blood pressure has stabilized off of IV fluids and steroids.  Discontinue Lasix use upon discharge.   Hypoglycemia  Severe protein calorie malnutrition  patient had hypoglycemia episode, triggering bradycardia and less responsiveness, Code blue was called, she received 2 chest compression, for weak pulse, no fran cardiac arrest. She received D50 with improvement in her hemodynamics.   -Cortisol and TSH within normal limit  -A1c  is significantly low at 4.1 m daughter report patient with very poor oral intake significant weight loss around 70 pounds over last year or so  -Will start on Remeron. -Will start on prednisone to see if this helps with appetite. -Nutritionist consulted.onitor CBGs closely, this happened in ICU will check TSH, A1c and cortisol levels.   CBG (last 3)  Recent Labs    01/14/24 0814 01/14/24 0910 01/14/24 1301  GLUCAP 46* 108* 85   Lab Results  Component Value Date   HGBA1C 4.1 (L) 01/13/2024    ESRD on hemodialysis (HCC)  -   Renal following, TTS schedule, Access left arm fistula   Chronic diastolic CHF (congestive heart failure) (HCC) preserved at 65%.  Fluid removal via HD.  Due to severe hypotension upon admission discontinue Lasix use on non-HD days, if blood pressure is stable will cautiously resume Coreg .   Severe protein-calorie malnutrition  (HCC)  Consult nutrition, her po intake has significantly decreased.  Very poor prognosis.    Essential hypertension Hypotension on this admission.  Blood pressure improving, resume Coreg  if blood pressure permits.  Recently diagnosed PE.  CTA this admission shows resolution of PE.  No need for Eliquis for the next few days, was very small on previous CTA few weeks ago, if platelet counts continue to rise stabilize 78-month course of Eliquis total can be considered.  Heel ulcer (HCC)  Not able to stage. Present on admission, Consult wound care team.   Chronic underlying weakness and deconditioning.  Bed and wheelchair bound.  Supportive care.      Condition - Extremely Guarded  Family Communication  : Daughter bedside on 01/14/2024  Code Status : Full code  Consults  : Nephrology, PCCM  PUD Prophylaxis :     Procedures  :     TTE - 1. Left ventricular ejection fraction, by estimation, is 60 to 65%. The left ventricle has normal function. The left ventricle has no regional wall motion abnormalities. There is mild concentric left ventricular hypertrophy. Left ventricular diastolic parameters are consistent with Grade I diastolic dysfunction (impaired relaxation).  2. Right ventricular systolic function is normal. The right ventricular size is normal. There is normal pulmonary artery systolic pressure. The estimated right ventricular systolic pressure is 31.3 mmHg.  3. The mitral valve is degenerative. No evidence of mitral valve regurgitation. No evidence of mitral stenosis. Moderate mitral annular calcification.  4. The aortic valve is tricuspid. There is mild calcification of the aortic valve. Aortic valve regurgitation is trivial. Aortic valve sclerosis/calcification is present, without any evidence of aortic stenosis.  5. The inferior vena cava is normal in size with greater than 50% respiratory variability, suggesting right atrial pressure of 3 mmHg.      Disposition Plan  :    Status  is: Inpatient  DVT Prophylaxis  :    Place and maintain sequential compression device Start: 01/13/24 1017 heparin injection 5,000 Units Start:  01/12/24 1400 Place and maintain sequential compression device Start: 01/11/24 1543 SCDs Start: 01/11/24 1610    Lab Results  Component Value Date   PLT 72 (L) 01/14/2024    Diet :  Diet Order             Diet renal with fluid restriction Fluid restriction: 1200 mL Fluid; Room service appropriate? Yes; Fluid consistency: Thin  Diet effective now                    Inpatient Medications  Scheduled Meds:  carvedilol  3.125 mg Oral BID   Chlorhexidine Gluconate Cloth  6 each Topical Daily   Chlorhexidine Gluconate Cloth  6 each Topical Q0600   dextrose  2 Tube Oral STAT   heparin injection (subcutaneous)  5,000 Units Subcutaneous Q8H   isosorbide mononitrate  15 mg Oral Daily   leptospermum manuka honey  1 Application Topical Daily   mirtazapine  7.5 mg Oral QHS   multivitamin  1 tablet Oral Q M,W,F,Sa-1800   pneumococcal 20-valent conjugate vaccine  0.5 mL Intramuscular Tomorrow-1000   predniSONE  20 mg Oral Q breakfast   sevelamer carbonate  800 mg Oral TID WC   Continuous Infusions:  ampicillin-sulbactam (UNASYN) IV 3 g (01/12/24 1751)   sodium PHOSPHATE IVPB (in mmol)     PRN Meds:.acetaminophen **OR** acetaminophen, hydrALAZINE, midodrine    Objective:   Vitals:   01/14/24 0000 01/14/24 0200 01/14/24 0400 01/14/24 0939  BP:   122/76 109/78  Pulse: (!) 58 (!) 57 (!) 58 (!) 59  Resp: (!) 0 12 11   Temp:   97.7 F (36.5 C) 97.8 F (36.6 C)  TempSrc:   Oral Oral  SpO2: 98% 98% 98%   Weight:      Height:        Wt Readings from Last 3 Encounters:  01/13/24 50 kg  12/30/23 48.1 kg  12/23/23 48.1 kg     Intake/Output Summary (Last 24 hours) at 01/14/2024 1402 Last data filed at 01/13/2024 1720 Gross per 24 hour  Intake --  Output 1800 ml  Net -1800 ml     Physical Exam  Awake Alert, Oriented X 3,  limited frail, deconditioned, cachectic and thin appearing Symmetrical Chest wall movement, Good air movement bilaterally, CTAB RRR,No Gallops,Rubs or new Murmurs, No Parasternal Heave +ve B.Sounds, Abd Soft, No tenderness, No rebound - guarding or rigidity. No Cyanosis, bilateral lower extremity with floating boots     RN pressure injury documentation: Pressure Injury 12/13/23 Heel Left Stage 4 - Full thickness tissue loss with exposed bone, tendon or muscle. (Active)  12/13/23 1224  Location: Heel  Location Orientation: Left  Staging: Stage 4 - Full thickness tissue loss with exposed bone, tendon or muscle.  Wound Description (Comments):   Present on Admission:   Dressing Type Foam - Lift dressing to assess site every shift;Gauze (Comment);Other (Comment) (silvadene) 12/16/23 2005     Pressure Injury 01/11/24 Sacrum Stage 2 -  Partial thickness loss of dermis presenting as a shallow open injury with a red, pink wound bed without slough. (Active)  01/11/24 0900  Location: Sacrum  Location Orientation:   Staging: Stage 2 -  Partial thickness loss of dermis presenting as a shallow open injury with a red, pink wound bed without slough.  Wound Description (Comments):   Present on Admission: Yes  Dressing Type Foam - Lift dressing to assess site every shift 01/13/24 0900     Pressure Injury 01/11/24 Buttocks  Right;Medial Stage 2 -  Partial thickness loss of dermis presenting as a shallow open injury with a red, pink wound bed without slough. (Active)  01/11/24 0900  Location: Buttocks  Location Orientation: Right;Medial  Staging: Stage 2 -  Partial thickness loss of dermis presenting as a shallow open injury with a red, pink wound bed without slough.  Wound Description (Comments):   Present on Admission: Yes  Dressing Type Foam - Lift dressing to assess site every shift 01/13/24 0900     Pressure Injury 01/11/24 Buttocks Left;Medial;Lower Stage 2 -  Partial thickness loss of dermis  presenting as a shallow open injury with a red, pink wound bed without slough. (Active)  01/11/24 0900  Location: Buttocks  Location Orientation: Left;Medial;Lower  Staging: Stage 2 -  Partial thickness loss of dermis presenting as a shallow open injury with a red, pink wound bed without slough.  Wound Description (Comments):   Present on Admission: Yes  Dressing Type Foam - Lift dressing to assess site every shift 01/13/24 0900     Pressure Injury 01/11/24 Heel Left;Posterior;Mid Unstageable - Full thickness tissue loss in which the base of the injury is covered by slough (yellow, tan, gray, green or brown) and/or eschar (tan, brown or black) in the wound bed. yellow/white slough i (Active)  01/11/24 0900  Location: Heel  Location Orientation: Left;Posterior;Mid  Staging: Unstageable - Full thickness tissue loss in which the base of the injury is covered by slough (yellow, tan, gray, green or brown) and/or eschar (tan, brown or black) in the wound bed.  Wound Description (Comments): yellow/white slough in middle, dark hard black skin surrounding  Present on Admission: Yes  Dressing Type Foam - Lift dressing to assess site every shift 01/13/24 0900      Data Review:    Recent Labs  Lab 01/11/24 0228 01/12/24 1048 01/13/24 0503 01/14/24 0506  WBC 4.0 6.9 7.2 5.1  HGB 10.0* 9.2* 9.9* 8.8*  HCT 32.8* 29.0* 31.4* 26.8*  PLT 33* 51* 65* 72*  MCV 92.4 89.5 88.2 87.3  MCH 28.2 28.4 27.8 28.7  MCHC 30.5 31.7 31.5 32.8  RDW 16.4* 16.8* 16.5* 16.8*  LYMPHSABS  --   --   --  0.8  MONOABS  --   --   --  0.3  EOSABS  --   --   --  0.1  BASOSABS  --   --   --  0.0    Recent Labs  Lab 01/11/24 0228 01/11/24 0432 01/11/24 1622 01/12/24 1048 01/13/24 0502 01/13/24 0503 01/13/24 0748 01/13/24 1023 01/14/24 0506  NA 131*  --   --  130*  --  127*  --   --  129*  K 3.4*  --   --  3.8  --  3.5  --   --  3.4*  CL 98  --   --  100  --  94*  --   --  96*  CO2 25  --   --  20*  --  22   --   --  26  ANIONGAP 8  --   --  10  --  11  --   --  7  GLUCOSE 77  --   --  175*  --  108*  --   --  107*  BUN 10  --   --  16  --  23  --   --  13  CREATININE 1.48*  --   --  2.20*  --  2.54*  --   --  1.80*  AST 28  --   --   --   --   --   --   --   --   ALT 18  --   --   --   --   --   --   --   --   ALKPHOS 69  --   --   --   --   --   --   --   --   BILITOT 0.6  --   --   --   --   --   --   --   --   ALBUMIN 2.5*  --   --   --   --   --   --   --   --   CRP  --   --   --   --   --   --  4.4*  --   --   PROCALCITON  --  0.72  --   --   --   --   --   --   --   LATICACIDVEN  --   --  1.0  --   --   --   --   --   --   INR 1.4*  --   --   --   --   --   --   --   --   TSH  --   --   --   --   --   --   --  1.930  --   HGBA1C  --   --   --   --   --  4.1*  --   --   --   BNP  --   --   --   --  1,016.0*  --   --   --  926.2*  MG  --   --   --   --   --  2.0  --   --  1.9  PHOS  --   --   --   --   --  1.5*  --   --  1.2*  CALCIUM 8.5*  --   --  8.1*  --  7.9*  --   --  7.5*      Recent Labs  Lab 01/11/24 0228 01/11/24 0432 01/11/24 1622 01/12/24 1048 01/13/24 0502 01/13/24 0503 01/13/24 0748 01/13/24 1023 01/14/24 0506  CRP  --   --   --   --   --   --  4.4*  --   --   PROCALCITON  --  0.72  --   --   --   --   --   --   --   LATICACIDVEN  --   --  1.0  --   --   --   --   --   --   INR 1.4*  --   --   --   --   --   --   --   --   TSH  --   --   --   --   --   --   --  1.930  --   HGBA1C  --   --   --   --   --  4.1*  --   --   --   BNP  --   --   --   --  1,016.0*  --   --   --  926.2*  MG  --   --   --   --   --  2.0  --   --  1.9  CALCIUM 8.5*  --   --  8.1*  --  7.9*  --   --  7.5*      Micro Results Recent Results (from the past 240 hours)  MRSA Next Gen by PCR, Nasal     Status: None   Collection Time: 01/11/24  3:19 PM   Specimen: Nasal Mucosa; Nasal Swab  Result Value Ref Range Status   MRSA by PCR Next Gen NOT DETECTED NOT DETECTED Final     Comment: (NOTE) The GeneXpert MRSA Assay (FDA approved for NASAL specimens only), is one component of a comprehensive MRSA colonization surveillance program. It is not intended to diagnose MRSA infection nor to guide or monitor treatment for MRSA infections. Test performance is not FDA approved in patients less than 38 years old. Performed at [facility], [address], [place], [place] [address]     Radiology Report DG Chest Port 1 View Result Date: 01/13/2024 CLINICAL DATA:  [id number] with shortness of breath, admitted for aspiration pneumonia left lower lobe. EXAM: PORTABLE CHEST 1 VIEW COMPARISON:  Portable chest 01/11/2024 at 2:31 a.m. FINDINGS: 7:26 a.m. Small bilateral pleural effusions are again noted, grossly improved on the left. Patchy airspace disease is still seen in the left base but is less confluent and less dense today. Scattered opacities in the right lower lung field are unchanged consistent with atelectasis or small areas of pneumonia. There are overlying skin folds on the left with remaining lungs generally clear. The heart is enlarged, stable. No vascular congestion or edema is seen. The mediastinum is unchanged with calcification of the transverse aorta. Osteopenia and degenerative change of the spine. IMPRESSION: 1. Small bilateral pleural effusions, grossly improved on the left. 2. Improved left basilar airspace disease. 3. Scattered opacities in the right lower lung field are unchanged consistent with atelectasis or small areas of pneumonia. 4. Stable cardiomegaly. 5. Aortic atherosclerosis. Electronically Signed   By: Almira Bar M.D.   On: 01/13/2024 07:52     Signature  -   Huey Bienenstock M.D on 01/14/2024 at 2:02 PM   -  To page go to [email or web address]

## 2024-01-15 DIAGNOSIS — E440 Moderate protein-calorie malnutrition: Secondary | ICD-10-CM | POA: Insufficient documentation

## 2024-01-15 DIAGNOSIS — J690 Pneumonitis due to inhalation of food and vomit: Secondary | ICD-10-CM | POA: Diagnosis not present

## 2024-01-15 LAB — CBC WITH DIFFERENTIAL/PLATELET
Abs Immature Granulocytes: 0.02 10*3/uL (ref 0.00–0.07)
Basophils Absolute: 0 10*3/uL (ref 0.0–0.1)
Basophils Relative: 0 %
Eosinophils Absolute: 0 10*3/uL (ref 0.0–0.5)
Eosinophils Relative: 0 %
HCT: 32 % — ABNORMAL LOW (ref 36.0–46.0)
Hemoglobin: 10.1 g/dL — ABNORMAL LOW (ref 12.0–15.0)
Immature Granulocytes: 0 %
Lymphocytes Relative: 9 %
Lymphs Abs: 0.4 10*3/uL — ABNORMAL LOW (ref 0.7–4.0)
MCH: 27.8 pg (ref 26.0–34.0)
MCHC: 31.6 g/dL (ref 30.0–36.0)
MCV: 88.2 fL (ref 80.0–100.0)
Monocytes Absolute: 0.1 10*3/uL (ref 0.1–1.0)
Monocytes Relative: 2 %
Neutro Abs: 4.1 10*3/uL (ref 1.7–7.7)
Neutrophils Relative %: 89 %
Platelets: 77 10*3/uL — ABNORMAL LOW (ref 150–400)
RBC: 3.63 MIL/uL — ABNORMAL LOW (ref 3.87–5.11)
RDW: 16.7 % — ABNORMAL HIGH (ref 11.5–15.5)
WBC: 4.7 10*3/uL (ref 4.0–10.5)
nRBC: 0.9 % — ABNORMAL HIGH (ref 0.0–0.2)

## 2024-01-15 LAB — BASIC METABOLIC PANEL WITH GFR
Anion gap: 10 (ref 5–15)
BUN: 8 mg/dL (ref 8–23)
CO2: 26 mmol/L (ref 22–32)
Calcium: 7.6 mg/dL — ABNORMAL LOW (ref 8.9–10.3)
Chloride: 95 mmol/L — ABNORMAL LOW (ref 98–111)
Creatinine, Ser: 1.49 mg/dL — ABNORMAL HIGH (ref 0.44–1.00)
GFR, Estimated: 37 mL/min — ABNORMAL LOW (ref >60)
Glucose, Bld: 157 mg/dL — ABNORMAL HIGH (ref 70–99)
Potassium: 3.7 mmol/L (ref 3.5–5.1)
Sodium: 131 mmol/L — ABNORMAL LOW (ref 135–145)

## 2024-01-15 LAB — GLUCOSE, CAPILLARY
Glucose-Capillary: 78 mg/dL (ref 70–99)
Glucose-Capillary: 169 mg/dL — ABNORMAL HIGH (ref 70–99)
Glucose-Capillary: 129 mg/dL — ABNORMAL HIGH (ref 70–99)

## 2024-01-15 LAB — BRAIN NATRIURETIC PEPTIDE: B Natriuretic Peptide: 718.4 pg/mL — ABNORMAL HIGH (ref 0.0–100.0)

## 2024-01-15 LAB — PHOSPHORUS: Phosphorus: 2.1 mg/dL — ABNORMAL LOW (ref 2.5–4.6)

## 2024-01-15 LAB — MAGNESIUM: Magnesium: 1.9 mg/dL (ref 1.7–2.4)

## 2024-01-15 MED ORDER — HEPARIN SODIUM (PORCINE) 1000 UNIT/ML IJ SOLN
3000.0000 [IU] | INTRAMUSCULAR | Status: DC | PRN
  Administered 2024-01-15 – 2024-01-17 (×2): 3000 [IU] via INTRAVENOUS
  Filled 2024-01-15 (×2): qty 3

## 2024-01-15 MED ORDER — SODIUM PHOSPHATES 45 MMOLE/15ML IV SOLN
15.0000 mmol | Freq: Once | INTRAVENOUS | Status: AC
  Administered 2024-01-15: 15 mmol via INTRAVENOUS
  Filled 2024-01-15: qty 5

## 2024-01-15 MED ORDER — HEPARIN SODIUM (PORCINE) 1000 UNIT/ML IJ SOLN
1500.0000 [IU] | INTRAMUSCULAR | Status: DC | PRN
  Administered 2024-01-17: 1500 [IU] via INTRAVENOUS
  Filled 2024-01-15: qty 2

## 2024-01-15 MED ORDER — ENSURE ENLIVE PO LIQD
237.0000 mL | Freq: Two times a day (BID) | ORAL | Status: DC
  Administered 2024-01-16 (×2): 237 mL via ORAL

## 2024-01-15 NOTE — Progress Notes (Signed)
 Completed 3.25 hours of dialysis. Tolerated fluid removal of 2.7L. VS stable, no acute events. All blood returned safely. LUA AVF needles x2 de-accessed, manual pressure applied to sites until hemostasis was achieved. Sites secured with gauze/taped.  Patient is alert and awake. No signs of distress.  Handoff report to Amy Costner,RN.

## 2024-01-15 NOTE — Plan of Care (Signed)
  Problem: Education: Goal: Knowledge of General Education information will improve Description: Including pain rating scale, medication(s)/side effects and non-pharmacologic comfort measures Outcome: Progressing   Problem: Clinical Measurements: Goal: Ability to maintain clinical measurements within normal limits will improve Outcome: Progressing Goal: Will remain free from infection Outcome: Progressing Goal: Diagnostic test results will improve Outcome: Progressing Goal: Respiratory complications will improve Outcome: Progressing Goal: Cardiovascular complication will be avoided Outcome: Progressing   Problem: Activity: Goal: Risk for activity intolerance will decrease Outcome: Progressing   Problem: Skin Integrity: Goal: Risk for impaired skin integrity will decrease Outcome: Progressing

## 2024-01-15 NOTE — Progress Notes (Signed)
 [facility] Progress Note  Subjective:  Seen in room Alert, talkative today  Vitals:   01/15/24 0710 01/15/24 0720 01/15/24 0730 01/15/24 0845  BP:   120/77 (!) 84/73  Pulse: 64 64 65 68  Resp: 18 15 17    Temp:   (!) 97.4 F (36.3 C)   TempSrc:   Oral   SpO2: 99% 100% 99%   Weight:      Height:        Exam: General: Frail elderly female, on RA Lungs: Clear bilaterally to auscultation  Heart: S1, S2 2/6 systolic M. No R/G Abdomen: NABS, NT Lower extremities: no LE edema Neuro: Alert and oriented X 2 Dialysis Access: L AVF + T/B   [facility]: [facility]  3h  B350   45.5kg  3K bath  AVF  LUE   Heparin 4000 - Mircera 30 mcg IV Q 4 weeks (last dose 12/30/2023)   CXR 3/30 - IMPRESSION: Small moderate left greater than right pleural effusions similar to 12/20/2023. Basilar airspace opacities may be due to atelectasis or pneumonia.   Assessment/Plan:  Aspiration PNA - bilat LL infiltrates per CT. Hypothermia resolved, on IV abx. Room air at 99%.  Hypophosphatemia - phos 1.2 yest, up to 2.1 today after IV phos. Give another 15 mmol IV sod phos today, dc renvela.  Anorexia - pt not eating, A1c low at 4.1, 70 lb wt loss over last year. Started on remeron and prednisone per pmd.   Brief PEA Arrest-short round of chest compressions, not intubated.  Shock- resolved now, BP's 140/80.  ESRD - TTS HD. Had HD early this am. Next HD Sat.   Volume  - no excess volume by exam. 2.7 L off today. CXR's w/o edema x 3   Anemia  - HGB 10.0. Follow HGB.   Metabolic bone disease - CCa ok, phos very low, see above. DC renvela  Nutrition - Very low albumin. Protein supplements H/O PE on apixaban  Kara Moselle  MD  [facility] 01/15/2024, 11:48 AM  Recent Labs  Lab 01/11/24 0228 01/12/24 1048 01/14/24 0506 01/15/24 1007  HGB 10.0*   < > 8.8* 10.1*  ALBUMIN 2.5*  --   --   --   CALCIUM 8.5*   < > 7.5* 7.6*  PHOS  --    < > 1.2* 2.1*  CREATININE 1.48*   < > 1.80* 1.49*  K 3.4*   < >  3.4* 3.7   < > = values in this interval not displayed.    Inpatient medications:  carvedilol  3.125 mg Oral BID   Chlorhexidine Gluconate Cloth  6 each Topical Q0600   heparin injection (subcutaneous)  5,000 Units Subcutaneous Q8H   isosorbide mononitrate  15 mg Oral Daily   leptospermum manuka honey  1 Application Topical Daily   mirtazapine  7.5 mg Oral QHS   multivitamin  1 tablet Oral Q M,W,F,Sa-1800   pneumococcal 20-valent conjugate vaccine  0.5 mL Intramuscular Tomorrow-1000   predniSONE  20 mg Oral Q breakfast   sevelamer carbonate  800 mg Oral TID WC    ampicillin-sulbactam (UNASYN) IV 3 g (01/14/24 1844)   acetaminophen **OR** acetaminophen, heparin sodium (porcine), heparin sodium (porcine), hydrALAZINE, midodrine

## 2024-01-15 NOTE — Progress Notes (Signed)
 PROGRESS NOTE                                                                                                                                                                                                             Patient Demographics:    Kara Mullins, is a 73 y.o. female, DOB - 1951-06-16, ZOX:[id number]  Outpatient Primary MD for the patient is [name], [name], NP    LOS - 4  Admit date - 01/11/2024    Chief Complaint  Patient presents with   Shortness of Breath       Brief Narrative (HPI from H&P)     73 y.o. female with medical history significant of ESRD on HD, heart failure, hypertension and hyperlipidemia who presented with dyspnea and failure to thrive.  She lives with her daughter who is her caregiver. Patient was recently hospitalized 03/1 to 12/16/23 for influenza A infection with bacterial pneumonia superinfection. She was treated with oseltamivir and antibiotic therapy.  Patient at home continue to be very weak and deconditioned. Very poor oral intake, progressive less mobility and worsening dyspnea on exertion.  She has been using a wheelchair for over one year.    03/08 ED visit for dyspnea, CT chest was done, and resulted positive for pulmonary embolism, subsegmental right upper lobe. Small thromboembolic burden. Placed on apixaban for anticoagulation,    03/11 Cardiology follow up recommended to continue furosemide on non HD days.     At home her daughter noted patient's blood pressure bing low over the last 6 days, on 03/25 HD her daughter was told that her mother's blood pressure was not low. Yesterday patient had dialysis, when reached home she continue very weak and deconditioned.  He was brought to the hospital for weakness, hypotension and shortness of breath.  Workup consistent with pneumonia, she was found to be extremely hypotensive required pressors in ICU, stabilized transferred to my care  on 01/13/2024.   Subjective:    Kara Mullins she had HD late overnight early this a.m., so she is sleepy, reports overall appetite has improved    Assessment  & Plan :   Aspiration pneumonia of left lower lobe (HCC) - patient very weak and deconditioned. Chest CT with left lower lobe infiltrate suggestive aspiration,, completing 5 days of Unasyn, speech therapy following.  Pneumonia clinically much improved. -He  was encouraged to use incentive spirometry and flutter valve  Hypotension caused by severe dehydration, decreased oral intake, diuretic dose unknown HD days -she was admitted to ICU, received gentle IV fluids, along with IV hydrocortisone, blood pressure has stabilized off of IV fluids and steroids.  Discontinue Lasix use upon discharge. -Discussed with daughter, likely she will need to hold her Imdur and Coreg at the morning of dialysis as an outpatient   Hypoglycemia  Severe protein calorie malnutrition  patient had hypoglycemia episode, triggering bradycardia and less responsiveness, Code blue was called, she received 2 chest compression, for weak pulse, no fran cardiac arrest. She received D50 with improvement in her hemodynamics.   -Cortisol and TSH within normal limit  -A1c  is significantly low at 4.1 m daughter report patient with very poor oral intake significant weight loss around 70 pounds over last year or so  -Will start on Remeron. -Will start on prednisone to see if this helps with appetite. -Diet appears with some improvement after starting prednisone and Remeron   CBG (last 3)  Recent Labs    01/14/24 1942 01/15/24 0725 01/15/24 1256  GLUCAP 145* 78 169*   Lab Results  Component Value Date   HGBA1C 4.1 (L) 01/13/2024    ESRD on hemodialysis (HCC)  -   Renal following, TTS schedule, Access left arm fistula   Chronic diastolic CHF (congestive heart failure) (HCC) preserved at 65%.  Fluid removal via HD.  Due to severe hypotension upon admission  discontinue Lasix use on non-HD days, if blood pressure is stable will cautiously resume Coreg .   Severe protein-calorie malnutrition (HCC)  Consult nutrition, her po intake has significantly decreased.  Very poor prognosis.  See above discussion about adding Remeron and steroids   Essential hypertension Hypotension on this admission.  Blood pressure improving, overall it is soft on dialysis days, so I discussed with daughter to hold Imdur and Coreg at morning of dialysis as an outpatient, still needing as needed midodrine pre and postdialysis.   Recently diagnosed PE.  CTA this admission shows resolution of PE.  - No need for Eliquis for the next few days, was very small on previous CTA few weeks ago, platelet count remains low, at this point it does look anticoagulation risks outweighed benefits especially with confirmed resolution of her small subsegmental PE   Heel ulcer, present on admission.  [facility])  Not able to stage. Present on admission, Consult wound care team.   Subpleural nodule -Stable on repeat imaging, will need further workup as an outpatient, but meanwhile she was encouraged use incentive spirometry and flutter valve  Hypophosphatemia -Replaced, Renvela was discontinued  Chronic underlying weakness and deconditioning.  Bed and wheelchair bound.  Supportive care.      Condition - Extremely Guarded  Family Communication  : Daughter bedside on 01/14/2024  Code Status : Full code  Consults  : Nephrology, PCCM  PUD Prophylaxis :     Procedures  :     TTE - 1. Left ventricular ejection fraction, by estimation, is 60 to 65%. The left ventricle has normal function. The left ventricle has no regional wall motion abnormalities. There is mild concentric left ventricular hypertrophy. Left ventricular diastolic parameters are consistent with Grade I diastolic dysfunction (impaired relaxation).  2. Right ventricular systolic function is normal. The right ventricular size is normal.  There is normal pulmonary artery systolic pressure. The estimated right ventricular systolic pressure is 31.3 mmHg.  3. The mitral valve is degenerative. No  evidence of mitral valve regurgitation. No evidence of mitral stenosis. Moderate mitral annular calcification.  4. The aortic valve is tricuspid. There is mild calcification of the aortic valve. Aortic valve regurgitation is trivial. Aortic valve sclerosis/calcification is present, without any evidence of aortic stenosis.  5. The inferior vena cava is normal in size with greater than 50% respiratory variability, suggesting right atrial pressure of 3 mmHg.      Disposition Plan  :    Status is: Inpatient  DVT Prophylaxis  :    Place and maintain sequential compression device Start: 01/13/24 1017 heparin injection 5,000 Units Start: 01/12/24 1400 Place and maintain sequential compression device Start: 01/11/24 1543 SCDs Start: 01/11/24 8119    Lab Results  Component Value Date   PLT 77 (L) 01/15/2024    Diet :  Diet Order             Diet regular Room service appropriate? Yes; Fluid consistency: Thin  Diet effective now                    Inpatient Medications  Scheduled Meds:  carvedilol  3.125 mg Oral BID   Chlorhexidine Gluconate Cloth  6 each Topical Q0600   heparin injection (subcutaneous)  5,000 Units Subcutaneous Q8H   isosorbide mononitrate  15 mg Oral Daily   leptospermum manuka honey  1 Application Topical Daily   mirtazapine  7.5 mg Oral QHS   multivitamin  1 tablet Oral Q M,W,F,Sa-1800   pneumococcal 20-valent conjugate vaccine  0.5 mL Intramuscular Tomorrow-1000   predniSONE  20 mg Oral Q breakfast   Continuous Infusions:  ampicillin-sulbactam (UNASYN) IV 3 g (01/14/24 1844)   sodium PHOSPHATE IVPB (in mmol) 15 mmol (01/15/24 1247)   PRN Meds:.acetaminophen **OR** acetaminophen, heparin sodium (porcine), heparin sodium (porcine), hydrALAZINE, midodrine    Objective:   Vitals:   01/15/24 0710  01/15/24 0720 01/15/24 0730 01/15/24 0845  BP:   120/77 (!) 84/73  Pulse: 64 64 65 68  Resp: 18 15 17    Temp:   (!) 97.4 F (36.3 C)   TempSrc:   Oral   SpO2: 99% 100% 99%   Weight:      Height:        Wt Readings from Last 3 Encounters:  01/13/24 50 kg  12/30/23 48.1 kg  12/23/23 48.1 kg     Intake/Output Summary (Last 24 hours) at 01/15/2024 1412 Last data filed at 01/15/2024 1000 Gross per 24 hour  Intake 240 ml  Output 2701 ml  Net -2461 ml     Physical Exam  Awake Alert, Oriented X 3, limited frail, deconditioned, cachectic and thin appearing Symmetrical Chest wall movement, Good air movement bilaterally, CTAB RRR,No Gallops,Rubs or new Murmurs, No Parasternal Heave +ve B.Sounds, Abd Soft, No tenderness, No rebound - guarding or rigidity.  No Cyanosis, bilateral lower extremity with floating boots     RN pressure injury documentation: Pressure Injury 12/13/23 Heel Left Stage 4 - Full thickness tissue loss with exposed bone, tendon or muscle. (Active)  12/13/23 1224  Location: Heel  Location Orientation: Left  Staging: Stage 4 - Full thickness tissue loss with exposed bone, tendon or muscle.  Wound Description (Comments):   Present on Admission:   Dressing Type Foam - Lift dressing to assess site every shift;Gauze (Comment);Other (Comment) (silvadene) 12/16/23 2005     Pressure Injury 01/11/24 Sacrum Stage 2 -  Partial thickness loss of dermis presenting as a shallow open injury with a red,  pink wound bed without slough. (Active)  01/11/24 0900  Location: Sacrum  Location Orientation:   Staging: Stage 2 -  Partial thickness loss of dermis presenting as a shallow open injury with a red, pink wound bed without slough.  Wound Description (Comments):   Present on Admission: Yes  Dressing Type Foam - Lift dressing to assess site every shift 01/15/24 0752     Pressure Injury 01/11/24 Buttocks Right;Medial Stage 2 -  Partial thickness loss of dermis presenting as a  shallow open injury with a red, pink wound bed without slough. (Active)  01/11/24 0900  Location: Buttocks  Location Orientation: Right;Medial  Staging: Stage 2 -  Partial thickness loss of dermis presenting as a shallow open injury with a red, pink wound bed without slough.  Wound Description (Comments):   Present on Admission: Yes  Dressing Type Foam - Lift dressing to assess site every shift 01/13/24 0900     Pressure Injury 01/11/24 Buttocks Left;Medial;Lower Stage 2 -  Partial thickness loss of dermis presenting as a shallow open injury with a red, pink wound bed without slough. (Active)  01/11/24 0900  Location: Buttocks  Location Orientation: Left;Medial;Lower  Staging: Stage 2 -  Partial thickness loss of dermis presenting as a shallow open injury with a red, pink wound bed without slough.  Wound Description (Comments):   Present on Admission: Yes  Dressing Type Foam - Lift dressing to assess site every shift 01/15/24 0752     Pressure Injury 01/11/24 Heel Left;Posterior;Mid Unstageable - Full thickness tissue loss in which the base of the injury is covered by slough (yellow, tan, gray, green or brown) and/or eschar (tan, brown or black) in the wound bed. yellow/white slough i (Active)  01/11/24 0900  Location: Heel  Location Orientation: Left;Posterior;Mid  Staging: Unstageable - Full thickness tissue loss in which the base of the injury is covered by slough (yellow, tan, gray, green or brown) and/or eschar (tan, brown or black) in the wound bed.  Wound Description (Comments): yellow/white slough in middle, dark hard black skin surrounding  Present on Admission: Yes  Dressing Type Foam - Lift dressing to assess site every shift 01/15/24 0752      Data Review:    Recent Labs  Lab 01/11/24 0228 01/12/24 1048 01/13/24 0503 01/14/24 0506 01/15/24 1007  WBC 4.0 6.9 7.2 5.1 4.7  HGB 10.0* 9.2* 9.9* 8.8* 10.1*  HCT 32.8* 29.0* 31.4* 26.8* 32.0*  PLT 33* 51* 65* 72* 77*   MCV 92.4 89.5 88.2 87.3 88.2  MCH 28.2 28.4 27.8 28.7 27.8  MCHC 30.5 31.7 31.5 32.8 31.6  RDW 16.4* 16.8* 16.5* 16.8* 16.7*  LYMPHSABS  --   --   --  0.8 0.4*  MONOABS  --   --   --  0.3 0.1  EOSABS  --   --   --  0.1 0.0  BASOSABS  --   --   --  0.0 0.0    Recent Labs  Lab 01/11/24 0228 01/11/24 0432 01/11/24 1622 01/12/24 1048 01/13/24 0502 01/13/24 0503 01/13/24 0748 01/13/24 1023 01/14/24 0506 01/15/24 1007 01/15/24 1022  NA 131*  --   --  130*  --  127*  --   --  129* 131*  --   K 3.4*  --   --  3.8  --  3.5  --   --  3.4* 3.7  --   CL 98  --   --  100  --  94*  --   --  96* 95*  --   CO2 25  --   --  20*  --  22  --   --  26 26  --   ANIONGAP 8  --   --  10  --  11  --   --  7 10  --   GLUCOSE 77  --   --  175*  --  108*  --   --  107* 157*  --   BUN 10  --   --  16  --  23  --   --  13 8  --   CREATININE 1.48*  --   --  2.20*  --  2.54*  --   --  1.80* 1.49*  --   AST 28  --   --   --   --   --   --   --   --   --   --   ALT 18  --   --   --   --   --   --   --   --   --   --   ALKPHOS 69  --   --   --   --   --   --   --   --   --   --   BILITOT 0.6  --   --   --   --   --   --   --   --   --   --   ALBUMIN 2.5*  --   --   --   --   --   --   --   --   --   --   CRP  --   --   --   --   --   --  4.4*  --   --   --   --   PROCALCITON  --  0.72  --   --   --   --   --   --   --   --   --   LATICACIDVEN  --   --  1.0  --   --   --   --   --   --   --   --   INR 1.4*  --   --   --   --   --   --   --   --   --   --   TSH  --   --   --   --   --   --   --  1.930  --   --   --   HGBA1C  --   --   --   --   --  4.1*  --   --   --   --   --   BNP  --   --   --   --  1,016.0*  --   --   --  926.2*  --  718.4*  MG  --   --   --   --   --  2.0  --   --  1.9 1.9  --   PHOS  --   --   --   --   --  1.5*  --   --  1.2* 2.1*  --   CALCIUM 8.5*  --   --  8.1*  --  7.9*  --   --  7.5* 7.6*  --       Recent Labs  Lab 01/11/24 0228 01/11/24 0432 01/11/24 1622  01/12/24 1048 01/13/24 0502 01/13/24 0503 01/13/24 0748 01/13/24 1023 01/14/24 0506 01/15/24 1007 01/15/24 1022  CRP  --   --   --   --   --   --  4.4*  --   --   --   --   PROCALCITON  --  0.72  --   --   --   --   --   --   --   --   --   LATICACIDVEN  --   --  1.0  --   --   --   --   --   --   --   --   INR 1.4*  --   --   --   --   --   --   --   --   --   --   TSH  --   --   --   --   --   --   --  1.930  --   --   --   HGBA1C  --   --   --   --   --  4.1*  --   --   --   --   --   BNP  --   --   --   --  1,016.0*  --   --   --  926.2*  --  718.4*  MG  --   --   --   --   --  2.0  --   --  1.9 1.9  --   CALCIUM 8.5*  --   --  8.1*  --  7.9*  --   --  7.5* 7.6*  --       Micro Results Recent Results (from the past 240 hours)  MRSA Next Gen by PCR, Nasal     Status: None   Collection Time: 01/11/24  3:19 PM   Specimen: Nasal Mucosa; Nasal Swab  Result Value Ref Range Status   MRSA by PCR Next Gen NOT DETECTED NOT DETECTED Final    Comment: (NOTE) The GeneXpert MRSA Assay (FDA approved for NASAL specimens only), is one component of a comprehensive MRSA colonization surveillance program. It is not intended to diagnose MRSA infection nor to guide or monitor treatment for MRSA infections. Test performance is not FDA approved in patients less than 2 years old. Performed at [facility], [address] [place], [place] [address]     Radiology Report No results found.    Signature  -   Huey Bienenstock M.D on 01/15/2024 at 2:12 PM   -  To page go to [email or web address]

## 2024-01-16 ENCOUNTER — Other Ambulatory Visit: Payer: Self-pay | Admitting: Family

## 2024-01-16 DIAGNOSIS — J690 Pneumonitis due to inhalation of food and vomit: Secondary | ICD-10-CM | POA: Diagnosis not present

## 2024-01-16 DIAGNOSIS — Z992 Dependence on renal dialysis: Secondary | ICD-10-CM

## 2024-01-16 LAB — CBC WITH DIFFERENTIAL/PLATELET
Abs Immature Granulocytes: 0.03 10*3/uL (ref 0.00–0.07)
Basophils Absolute: 0 10*3/uL (ref 0.0–0.1)
Basophils Relative: 0 %
Eosinophils Absolute: 0 10*3/uL (ref 0.0–0.5)
Eosinophils Relative: 0 %
HCT: 30.3 % — ABNORMAL LOW (ref 36.0–46.0)
Hemoglobin: 9.6 g/dL — ABNORMAL LOW (ref 12.0–15.0)
Immature Granulocytes: 0 %
Lymphocytes Relative: 14 %
Lymphs Abs: 1 10*3/uL (ref 0.7–4.0)
MCH: 28.1 pg (ref 26.0–34.0)
MCHC: 31.7 g/dL (ref 30.0–36.0)
MCV: 88.6 fL (ref 80.0–100.0)
Monocytes Absolute: 0.4 10*3/uL (ref 0.1–1.0)
Monocytes Relative: 7 %
Neutro Abs: 5.4 10*3/uL (ref 1.7–7.7)
Neutrophils Relative %: 79 %
Platelets: 91 10*3/uL — ABNORMAL LOW (ref 150–400)
RBC: 3.42 MIL/uL — ABNORMAL LOW (ref 3.87–5.11)
RDW: 16.8 % — ABNORMAL HIGH (ref 11.5–15.5)
WBC: 6.8 10*3/uL (ref 4.0–10.5)
nRBC: 0 % (ref 0.0–0.2)

## 2024-01-16 LAB — MAGNESIUM: Magnesium: 2.2 mg/dL (ref 1.7–2.4)

## 2024-01-16 LAB — BASIC METABOLIC PANEL WITH GFR
Anion gap: 9 (ref 5–15)
BUN: 16 mg/dL (ref 8–23)
CO2: 29 mmol/L (ref 22–32)
Calcium: 7.2 mg/dL — ABNORMAL LOW (ref 8.9–10.3)
Chloride: 95 mmol/L — ABNORMAL LOW (ref 98–111)
Creatinine, Ser: 1.98 mg/dL — ABNORMAL HIGH (ref 0.44–1.00)
GFR, Estimated: 27 mL/min — ABNORMAL LOW (ref >60)
Glucose, Bld: 93 mg/dL (ref 70–99)
Potassium: 3.4 mmol/L — ABNORMAL LOW (ref 3.5–5.1)
Sodium: 133 mmol/L — ABNORMAL LOW (ref 135–145)

## 2024-01-16 LAB — PHOSPHORUS: Phosphorus: 3.3 mg/dL (ref 2.5–4.6)

## 2024-01-16 LAB — BRAIN NATRIURETIC PEPTIDE: B Natriuretic Peptide: 946.1 pg/mL — ABNORMAL HIGH (ref 0.0–100.0)

## 2024-01-16 MED ORDER — CHLORHEXIDINE GLUCONATE CLOTH 2 % EX PADS: 6.0000 | MEDICATED_PAD | Freq: Every day | CUTANEOUS | Status: DC

## 2024-01-16 MED ORDER — MIRTAZAPINE 15 MG PO TABS
7.5000 mg | ORAL_TABLET | ORAL | Status: DC
  Administered 2024-01-16 – 2024-01-17 (×2): 7.5 mg via ORAL
  Filled 2024-01-16 (×2): qty 1

## 2024-01-16 NOTE — TOC Progression Note (Signed)
 Transition of Care [facility]) - Progression Note    Patient Details  Name: Kara Mullins MRN: [id number] Date of Birth: 04-May-1951  Transition of Care [facility]) CM/SW Contact  Gordy Clement, RN Phone Number: 01/16/2024, 3:41 PM  Clinical Narrative:     Patient being recommended HH PT and OT. Services will be provided by [facility]. No DME is recommended Anticipate Family to transport patient home at DC    Expected Discharge Plan: Home/Self Care Barriers to Discharge: Continued Medical Work up  Expected Discharge Plan and Services       Living arrangements for the past 2 months: Single Family Home                                       Social Determinants of Health (SDOH) Interventions SDOH Screenings   Food Insecurity: No Food Insecurity (01/11/2024)  Housing: Low Risk  (01/11/2024)  Transportation Needs: No Transportation Needs (01/11/2024)  Utilities: Not At Risk (01/11/2024)  Alcohol Screen: Low Risk  (06/11/2022)  Depression (PHQ2-9): Low Risk  (01/27/2023)  Financial Resource Strain: Low Risk  (06/11/2022)  Social Connections: Moderately Integrated (01/14/2024)  Recent Concern: Social Connections - Moderately Isolated (01/11/2024)  Tobacco Use: Low Risk  (01/11/2024)    Readmission Risk Interventions    12/15/2023    3:44 PM 03/24/2023    2:26 PM 02/26/2023    2:53 PM  Readmission Risk Prevention Plan  Transportation Screening Complete Complete Complete  PCP or Specialist Appt within 3-5 Days Complete Complete   HRI or Home Care Consult Complete Complete Complete  Social Work Consult for Recovery Care Planning/Counseling  -- Complete  Palliative Care Screening Not Applicable Not Applicable Not Applicable  Medication Review Oceanographer) Complete Complete Referral to Pharmacy

## 2024-01-16 NOTE — Progress Notes (Signed)
 OT Cancellation Note  Patient Details Name: Kara Mullins MRN: [id number] DOB: [birth year]   Cancelled Treatment:    Reason Eval/Treat Not Completed: Other (comment).  Pt. Politely declined skilled OT treatment session, stating she is sleeping right now.  Will attempt back as able.  Pt. Verbalized understanding.    Salvadore Oxford 01/16/2024, 12:40 PM  Boneta Lucks, COTA/L Acute Rehabilitation [phone]

## 2024-01-16 NOTE — Progress Notes (Signed)
 [facility] Progress Note  Subjective:  Seen in room No c/o's today  Vitals:   01/16/24 0300 01/16/24 0400 01/16/24 0500 01/16/24 0812  BP: 108/74 109/71  (!) 91/51  Pulse:  66  65  Resp:  15  12  Temp:  97.7 F (36.5 C)  (!) 97.4 F (36.3 C)  TempSrc:  Axillary  Oral  SpO2: 100% 91%  95%  Weight:   55.9 kg   Height:        Exam: General: Frail elderly female, on RA Lungs: Clear bilaterally to auscultation  Heart: S1, S2 2/6 systolic M. No R/G Abdomen: NABS, NT Lower extremities: no LE edema Neuro: Alert and oriented X 2 Dialysis Access: L AVF + T/B   [facility] HD: [facility]  3h  B350   45.5kg  3K bath  AVF  LUE   Heparin 4000 - Mircera 30 mcg IV Q 4 weeks (last dose 12/30/2023)   CXR 3/30 - IMPRESSION: Small moderate left greater than right pleural effusions similar to 12/20/2023. Basilar airspace opacities may be due to atelectasis or pneumonia.   Assessment/Plan: Aspiration PNA - bilat LL infiltrates per CT. Hypothermia resolved, on IV abx. Room air at 99%.  Anorexia/ severe malnutrition/ hypoglycemia - pt not eating, A1c low at 4.1, 70 lb wt loss over last year. Started on remeron and prednisone per pmd.   Brief PEA arrest- brief chest compressions, not intubated. ESRD - TTS HD. Next HD tomorrow.  BP - bp's soft today after 2.7 L UF on Thursday.  Volume  - no excess volume by exam. CXR's w/o edema. Wt's are off.  Anemia  - HGB 10.0. Follow HGB.  Metabolic bone disease - CCa ok, phos very low as below. Renvela off. Hypophosphatemia - phos up to 3.4 s/p 2 rounds of IV phos, dc'd renvela Nutrition - Very low albumin. Protein supplements H/O PE on apixaban  Vinson Moselle  MD  [facility] 01/16/2024, 10:26 AM  Recent Labs  Lab 01/11/24 0228 01/12/24 1048 01/15/24 1007 01/16/24 0526  HGB 10.0*   < > 10.1* 9.6*  ALBUMIN 2.5*  --   --   --   CALCIUM 8.5*   < > 7.6* 7.2*  PHOS  --    < > 2.1* 3.3  CREATININE 1.48*   < > 1.49* 1.98*  K 3.4*   < > 3.7 3.4*    < > = values in this interval not displayed.    Inpatient medications:  carvedilol  3.125 mg Oral BID   Chlorhexidine Gluconate Cloth  6 each Topical Q0600   feeding supplement  237 mL Oral BID BM   heparin injection (subcutaneous)  5,000 Units Subcutaneous Q8H   isosorbide mononitrate  15 mg Oral Daily   leptospermum manuka honey  1 Application Topical Daily   mirtazapine  7.5 mg Oral QHS   multivitamin  1 tablet Oral Q M,W,F,Sa-1800   pneumococcal 20-valent conjugate vaccine  0.5 mL Intramuscular Tomorrow-1000   predniSONE  20 mg Oral Q breakfast    ampicillin-sulbactam (UNASYN) IV Stopped (01/15/24 1746)   acetaminophen **OR** acetaminophen, heparin sodium (porcine), heparin sodium (porcine), hydrALAZINE, midodrine

## 2024-01-16 NOTE — Progress Notes (Signed)
 Physical Therapy Treatment Patient Details Name: Kara Mullins MRN: [id number] DOB: [birth year] Today's Date: 01/16/2024   History of Present Illness Pt is 73 y.o. female admitted 01/11/24 for dyspnea and failure to thrive. Chest CT with left lower lobe infiltrate suggestive of aspiration.  Found to be extremely hypotensive required pressors. PMH significant for ESRD on HD, heart failure, HTN, HLD. Of note, recent hospitalization 3/1-3/4 for Influenza A infection with bacterial pneumonia superinfection.    PT Comments  Patient progressing with mobility and able to tolerate up to chair during assist for hygiene and linen change.  She demonstrated incentive spirometer prior to mobility and after improving from to .  She also was proud she could step as using a standing lift for transfers at home.  Feel she will benefit from continued skilled PT during acute stay.  Recommend HHPT at d/c.     If plan is discharge home, recommend the following: A lot of help with bathing/dressing/bathroom;Two people to help with walking and/or transfers;Assistance with cooking/housework;Direct supervision/assist for medications management;Direct supervision/assist for financial management;Assist for transportation;Help with stairs or ramp for entrance   Can travel by private vehicle        Equipment Recommendations  None recommended by PT    Recommendations for Other Services       Precautions / Restrictions Precautions Precautions: Fall     Mobility  Bed Mobility   Bed Mobility: Supine to Sit, Sit to Supine     Supine to sit: Mod assist, HOB elevated, Used rails Sit to supine: Mod assist   General bed mobility comments: assist for lifting trunk with cues and increased time pt using both hands to push up, assist to scoot hips; to supine assist for legs onto bed    Transfers Overall transfer level: Needs assistance Equipment used: Rolling walker (2 wheels) Transfers: Sit to/from Stand,  Bed to chair/wheelchair/BSC Sit to Stand: Max assist   Step pivot transfers: Max assist       General transfer comment: up to walker to stand and noted pt soiled so returned to EOB and in standing at RW assisted for hygiene, though noted bed linen soiled so stood again to finish hygiene and step to chair with lifting and lowering help; bed changed and pt assisted back to bed stepping with RW though heavy lifing help and flexed posture with one leg longer than the other.    Ambulation/Gait                   Stairs             Wheelchair Mobility     Tilt Bed    Modified Rankin (Stroke Patients Only)       Balance Overall balance assessment: Needs assistance   Sitting balance-Leahy Scale: Fair Sitting balance - Comments: EOB then edge of chair without LOB     Standing balance-Leahy Scale: Poor Standing balance comment: mod A standing holding walker during hygiene efforts                            Communication Communication Communication: No apparent difficulties  Cognition Arousal: Alert Behavior During Therapy: WFL for tasks assessed/performed   PT - Cognitive impairments: No family/caregiver present to determine baseline, No apparent impairments                       PT - Cognition Comments: follows commands well and appropriate questions  Following commands: Intact      Cueing    Exercises      General Comments        Pertinent Vitals/Pain Pain Assessment Pain Assessment: No/denies pain    Home Living                          Prior Function            PT Goals (current goals can now be found in the care plan section) Progress towards PT goals: Progressing toward goals    Frequency    Min 1X/week      PT Plan      Co-evaluation              AM-PAC PT "6 Clicks" Mobility   Outcome Measure  Help needed turning from your back to your side while in a flat bed without using bedrails?: A  Lot Help needed moving from lying on your back to sitting on the side of a flat bed without using bedrails?: A Lot Help needed moving to and from a bed to a chair (including a wheelchair)?: Total Help needed standing up from a chair using your arms (e.g., wheelchair or bedside chair)?: Total Help needed to walk in hospital room?: Total Help needed climbing 3-5 steps with a railing? : Total 6 Click Score: 8    End of Session Equipment Utilized During Treatment: Gait belt Activity Tolerance: Patient tolerated treatment well Patient left: in bed;with call bell/phone within reach;with bed alarm set   PT Visit Diagnosis: Muscle weakness (generalized) (M62.81);Other abnormalities of gait and mobility (R26.89);Adult, failure to thrive (R62.7)     Time: 1502-1527 PT Time Calculation (min) (ACUTE ONLY): 25 min  Charges:    $Therapeutic Activity: 23-37 mins PT General Charges $$ ACUTE PT VISIT: 1 Visit                     Sheran Lawless, PT Acute Rehabilitation Services Office:[phone] 01/16/2024    Elray Mcgregor 01/16/2024, 5:08 PM

## 2024-01-16 NOTE — Progress Notes (Signed)
 PROGRESS NOTE                                                                                                                                                                                                             Patient Demographics:    Kara Mullins, is a 73 y.o. female, DOB - 1951/08/24, ZOX:[id number]  Outpatient Primary MD for the patient is [name], [name], NP    LOS - 5  Admit date - 01/11/2024    Chief Complaint  Patient presents with   Shortness of Breath       Brief Narrative (HPI from H&P)      73 y.o. female with medical history significant of ESRD on HD, heart failure, hypertension and hyperlipidemia who presented with dyspnea and failure to thrive.  She lives with her daughter who is her caregiver. Patient was recently hospitalized 03/1 to 12/16/23 for influenza A infection with bacterial pneumonia superinfection. She was treated with oseltamivir and antibiotic therapy.  Patient at home continue to be very weak and deconditioned. Very poor oral intake, progressive less mobility and worsening dyspnea on exertion.  She has been using a wheelchair for over one year.    03/08 ED visit for dyspnea, CT chest was done, and resulted positive for pulmonary embolism, subsegmental right upper lobe. Small thromboembolic burden. Placed on apixaban for anticoagulation,    03/11 Cardiology follow up recommended to continue furosemide on non HD days.     At home her daughter noted patient's blood pressure bing low over the last 6 days, on 03/25 HD her daughter was told that her mother's blood pressure was not low. Yesterday patient had dialysis, when reached home she continue very weak and deconditioned.  He was brought to the hospital for weakness, hypotension and shortness of breath.  Workup consistent with pneumonia, she was found to be extremely hypotensive required pressors in ICU, stabilized transferred to my care  on 01/13/2024.   Subjective:    [name] ports her appetite has improved, reports she is feeling sleepy this morning     Assessment  & Plan :   Aspiration pneumonia of left lower lobe (HCC) - patient very weak and deconditioned. Chest CT with left lower lobe infiltrate suggestive aspiration,, completing 5 days of Unasyn, speech therapy following.  Pneumonia clinically much improved. -He was encouraged to  use incentive spirometry and flutter valve  Hypotension caused by severe dehydration, decreased oral intake, diuretic dose unknown HD days -she was admitted to ICU, received gentle IV fluids, along with IV hydrocortisone, blood pressure has stabilized off of IV fluids and steroids.  Discontinue Lasix use upon discharge. -Discussed with daughter, likely she will need to hold her Imdur and Coreg at the morning of dialysis as an outpatient   Hypoglycemia  Severe protein calorie malnutrition  patient had hypoglycemia episode, triggering bradycardia and less responsiveness, Code blue was called, she received 2 chest compression, for weak pulse, no fran cardiac arrest. She received D50 with improvement in her hemodynamics.   -Cortisol and TSH within normal limit  -A1c  is significantly low at 4.1 m daughter report patient with very poor oral intake significant weight loss around 70 pounds over last year or so  - Appetite has improved on Remeron, but report makes her sleepy in the morning, so I will give a dose earlier at 8 PM. - Will continue with the prednisone, it was started to improve appetite, I would lower the dose. -Diet appears with some improvement after starting prednisone and Remeron   CBG (last 3)  Recent Labs    01/15/24 0725 01/15/24 1256 01/15/24 1625  GLUCAP 78 169* 129*   Lab Results  Component Value Date   HGBA1C 4.1 (L) 01/13/2024    ESRD on hemodialysis (HCC)  -   Renal following, TTS schedule, Access left arm fistula   Chronic diastolic CHF (congestive heart  failure) (HCC) preserved at 65%.  Fluid removal via HD.  Due to severe hypotension upon admission discontinue Lasix use on non-HD days, if blood pressure is stable will cautiously resume Coreg .   Severe protein-calorie malnutrition (HCC)  Consult nutrition, her po intake has significantly decreased.  Very poor prognosis.  See above discussion about adding Remeron and steroids   Essential hypertension Hypotension on this admission.  Blood pressure improving, overall it is soft on dialysis days, so I discussed with daughter to hold Imdur and Coreg at morning of dialysis as an outpatient, still needing as needed midodrine pre and postdialysis.   Recently diagnosed PE.  CTA this admission shows resolution of PE.  - No need for Eliquis for the next few days, was very small on previous CTA few weeks ago, platelet count remains low, at this point it does look anticoagulation risks outweighed benefits especially with confirmed resolution of her small subsegmental PE   Heel ulcer, present on admission.  [facility])  Not able to stage. Present on admission, Consult wound care team.   Subpleural nodule -Stable on repeat imaging, will need further workup as an outpatient, but meanwhile she was encouraged use incentive spirometry and flutter valve  Hypophosphatemia -Replaced, Renvela was discontinued  Chronic underlying weakness and deconditioning.  Bed and wheelchair bound.  Supportive care.      Condition - Extremely Guarded  Family Communication  : None at bedside  Code Status : Full code  Consults  : Nephrology, PCCM  PUD Prophylaxis :     Procedures  :     TTE - 1. Left ventricular ejection fraction, by estimation, is 60 to 65%. The left ventricle has normal function. The left ventricle has no regional wall motion abnormalities. There is mild concentric left ventricular hypertrophy. Left ventricular diastolic parameters are consistent with Grade I diastolic dysfunction (impaired relaxation).  2.  Right ventricular systolic function is normal. The right ventricular size is normal. There is  normal pulmonary artery systolic pressure. The estimated right ventricular systolic pressure is 31.3 mmHg.  3. The mitral valve is degenerative. No evidence of mitral valve regurgitation. No evidence of mitral stenosis. Moderate mitral annular calcification.  4. The aortic valve is tricuspid. There is mild calcification of the aortic valve. Aortic valve regurgitation is trivial. Aortic valve sclerosis/calcification is present, without any evidence of aortic stenosis.  5. The inferior vena cava is normal in size with greater than 50% respiratory variability, suggesting right atrial pressure of 3 mmHg.      Disposition Plan  :    Status is: Inpatient  DVT Prophylaxis  :    Place and maintain sequential compression device Start: 01/13/24 1017 heparin injection 5,000 Units Start: 01/12/24 1400 Place and maintain sequential compression device Start: 01/11/24 1543 SCDs Start: 01/11/24 1610    Lab Results  Component Value Date   PLT 91 (L) 01/16/2024    Diet :  Diet Order             Diet regular Room service appropriate? Yes; Fluid consistency: Thin  Diet effective now                    Inpatient Medications  Scheduled Meds:  carvedilol  3.125 mg Oral BID   Chlorhexidine Gluconate Cloth  6 each Topical Q0600   feeding supplement  237 mL Oral BID BM   heparin injection (subcutaneous)  5,000 Units Subcutaneous Q8H   isosorbide mononitrate  15 mg Oral Daily   leptospermum manuka honey  1 Application Topical Daily   mirtazapine  7.5 mg Oral Q24H   multivitamin  1 tablet Oral Q M,W,F,Sa-1800   pneumococcal 20-valent conjugate vaccine  0.5 mL Intramuscular Tomorrow-1000   predniSONE  20 mg Oral Q breakfast   Continuous Infusions:  ampicillin-sulbactam (UNASYN) IV Stopped (01/15/24 1746)   PRN Meds:.acetaminophen **OR** acetaminophen, heparin sodium (porcine), heparin sodium (porcine),  hydrALAZINE, midodrine    Objective:   Vitals:   01/16/24 0500 01/16/24 0812 01/16/24 1100 01/16/24 1216  BP:  (!) 91/51 103/68 (!) 108/58  Pulse:  65 72   Resp:  12 (!) 21   Temp:  (!) 97.4 F (36.3 C)  (!) 97.5 F (36.4 C)  TempSrc:  Oral  Oral  SpO2:  95% 95%   Weight: 55.9 kg  49.2 kg   Height:        Wt Readings from Last 3 Encounters:  01/16/24 49.2 kg  12/30/23 48.1 kg  12/23/23 48.1 kg     Intake/Output Summary (Last 24 hours) at 01/16/2024 1447 Last data filed at 01/16/2024 0800 Gross per 24 hour  Intake 560 ml  Output --  Net 560 ml     Physical Exam  Awake Alert, Oriented X 3, frail, appears to be in a good mood today, chronically ill-appearing Symmetrical Chest wall movement, Good air movement bilaterally, CTAB RRR,No Gallops,Rubs or new Murmurs, No Parasternal Heave +ve B.Sounds, Abd Soft, No tenderness, No rebound - guarding or rigidity. No Cyanosis,  bilateral lower extremity with floating boots     RN pressure injury documentation: Pressure Injury 01/11/24 Sacrum Stage 2 -  Partial thickness loss of dermis presenting as a shallow open injury with a red, pink wound bed without slough. (Active)  01/11/24 0900  Location: Sacrum  Location Orientation:   Staging: Stage 2 -  Partial thickness loss of dermis presenting as a shallow open injury with a red, pink wound bed without slough.  Wound Description (  Comments):   Present on Admission: Yes  Dressing Type Foam - Lift dressing to assess site every shift 01/16/24 0800     Pressure Injury 01/11/24 Buttocks Right;Medial Stage 2 -  Partial thickness loss of dermis presenting as a shallow open injury with a red, pink wound bed without slough. (Active)  01/11/24 0900  Location: Buttocks  Location Orientation: Right;Medial  Staging: Stage 2 -  Partial thickness loss of dermis presenting as a shallow open injury with a red, pink wound bed without slough.  Wound Description (Comments):   Present on  Admission: Yes  Dressing Type Foam - Lift dressing to assess site every shift 01/16/24 0000     Pressure Injury 01/11/24 Buttocks Left;Medial;Lower Stage 2 -  Partial thickness loss of dermis presenting as a shallow open injury with a red, pink wound bed without slough. (Active)  01/11/24 0900  Location: Buttocks  Location Orientation: Left;Medial;Lower  Staging: Stage 2 -  Partial thickness loss of dermis presenting as a shallow open injury with a red, pink wound bed without slough.  Wound Description (Comments):   Present on Admission: Yes  Dressing Type Foam - Lift dressing to assess site every shift 01/16/24 0000     Pressure Injury 01/11/24 Heel Left;Posterior;Mid Unstageable - Full thickness tissue loss in which the base of the injury is covered by slough (yellow, tan, gray, green or brown) and/or eschar (tan, brown or black) in the wound bed. yellow/white slough i (Active)  01/11/24 0900  Location: Heel  Location Orientation: Left;Posterior;Mid  Staging: Unstageable - Full thickness tissue loss in which the base of the injury is covered by slough (yellow, tan, gray, green or brown) and/or eschar (tan, brown or black) in the wound bed.  Wound Description (Comments): yellow/white slough in middle, dark hard black skin surrounding  Present on Admission: Yes  Dressing Type Foam - Lift dressing to assess site every shift 01/16/24 0800      Data Review:    Recent Labs  Lab 01/12/24 1048 01/13/24 0503 01/14/24 0506 01/15/24 1007 01/16/24 0526  WBC 6.9 7.2 5.1 4.7 6.8  HGB 9.2* 9.9* 8.8* 10.1* 9.6*  HCT 29.0* 31.4* 26.8* 32.0* 30.3*  PLT 51* 65* 72* 77* 91*  MCV 89.5 88.2 87.3 88.2 88.6  MCH 28.4 27.8 28.7 27.8 28.1  MCHC 31.7 31.5 32.8 31.6 31.7  RDW 16.8* 16.5* 16.8* 16.7* 16.8*  LYMPHSABS  --   --  0.8 0.4* 1.0  MONOABS  --   --  0.3 0.1 0.4  EOSABS  --   --  0.1 0.0 0.0  BASOSABS  --   --  0.0 0.0 0.0    Recent Labs  Lab 01/11/24 0228 01/11/24 0432 01/11/24 1622  01/12/24 1048 01/13/24 0502 01/13/24 0503 01/13/24 0748 01/13/24 1023 01/14/24 0506 01/15/24 1007 01/15/24 1022 01/16/24 0526  NA 131*  --   --  130*  --  127*  --   --  129* 131*  --  133*  K 3.4*  --   --  3.8  --  3.5  --   --  3.4* 3.7  --  3.4*  CL 98  --   --  100  --  94*  --   --  96* 95*  --  95*  CO2 25  --   --  20*  --  22  --   --  26 26  --  29  ANIONGAP 8  --   --  10  --  11  --   --  7 10  --  9  GLUCOSE 77  --   --  175*  --  108*  --   --  107* 157*  --  93  BUN 10  --   --  16  --  23  --   --  13 8  --  16  CREATININE 1.48*  --   --  2.20*  --  2.54*  --   --  1.80* 1.49*  --  1.98*  AST 28  --   --   --   --   --   --   --   --   --   --   --   ALT 18  --   --   --   --   --   --   --   --   --   --   --   ALKPHOS 69  --   --   --   --   --   --   --   --   --   --   --   BILITOT 0.6  --   --   --   --   --   --   --   --   --   --   --   ALBUMIN 2.5*  --   --   --   --   --   --   --   --   --   --   --   CRP  --   --   --   --   --   --  4.4*  --   --   --   --   --   PROCALCITON  --  0.72  --   --   --   --   --   --   --   --   --   --   LATICACIDVEN  --   --  1.0  --   --   --   --   --   --   --   --   --   INR 1.4*  --   --   --   --   --   --   --   --   --   --   --   TSH  --   --   --   --   --   --   --  1.930  --   --   --   --   HGBA1C  --   --   --   --   --  4.1*  --   --   --   --   --   --   BNP  --   --   --   --  1,016.0*  --   --   --  926.2*  --  718.4* 946.1*  MG  --   --   --   --   --  2.0  --   --  1.9 1.9  --  2.2  PHOS  --   --   --   --   --  1.5*  --   --  1.2* 2.1*  --  3.3  CALCIUM 8.5*  --   --  8.1*  --  7.9*  --   --  7.5* 7.6*  --  7.2*      Recent Labs  Lab 01/11/24 0228 01/11/24 0432 01/11/24 1622 01/12/24 1048 01/13/24 0502 01/13/24 0503 01/13/24 0748 01/13/24 1023 01/14/24 0506 01/15/24 1007 01/15/24 1022 01/16/24 0526  CRP  --   --   --   --   --   --  4.4*  --   --   --   --   --   PROCALCITON  --  0.72   --   --   --   --   --   --   --   --   --   --   LATICACIDVEN  --   --  1.0  --   --   --   --   --   --   --   --   --   INR 1.4*  --   --   --   --   --   --   --   --   --   --   --   TSH  --   --   --   --   --   --   --  1.930  --   --   --   --   HGBA1C  --   --   --   --   --  4.1*  --   --   --   --   --   --   BNP  --   --   --   --  1,016.0*  --   --   --  926.2*  --  718.4* 946.1*  MG  --   --   --   --   --  2.0  --   --  1.9 1.9  --  2.2  CALCIUM 8.5*  --   --  8.1*  --  7.9*  --   --  7.5* 7.6*  --  7.2*      Micro Results Recent Results (from the past 240 hours)  MRSA Next Gen by PCR, Nasal     Status: None   Collection Time: 01/11/24  3:19 PM   Specimen: Nasal Mucosa; Nasal Swab  Result Value Ref Range Status   MRSA by PCR Next Gen NOT DETECTED NOT DETECTED Final    Comment: (NOTE) The GeneXpert MRSA Assay (FDA approved for NASAL specimens only), is one component of a comprehensive MRSA colonization surveillance program. It is not intended to diagnose MRSA infection nor to guide or monitor treatment for MRSA infections. Test performance is not FDA approved in patients less than 24 years old. Performed at [facility], [address], [place], [place] [address]     Radiology Report No results found.    Signature  -   Huey Bienenstock M.D on 01/16/2024 at 2:47 PM   -  To page go to [email or web address]

## 2024-01-17 ENCOUNTER — Other Ambulatory Visit (HOSPITAL_COMMUNITY): Payer: Self-pay

## 2024-01-17 DIAGNOSIS — J690 Pneumonitis due to inhalation of food and vomit: Secondary | ICD-10-CM | POA: Diagnosis not present

## 2024-01-17 LAB — BRAIN NATRIURETIC PEPTIDE: B Natriuretic Peptide: 1285.8 pg/mL — ABNORMAL HIGH (ref 0.0–100.0)

## 2024-01-17 LAB — CBC WITH DIFFERENTIAL/PLATELET
Abs Immature Granulocytes: 0.04 10*3/uL (ref 0.00–0.07)
Basophils Absolute: 0 10*3/uL (ref 0.0–0.1)
Basophils Relative: 0 %
Eosinophils Absolute: 0 10*3/uL (ref 0.0–0.5)
Eosinophils Relative: 0 %
HCT: 29.8 % — ABNORMAL LOW (ref 36.0–46.0)
Hemoglobin: 9.4 g/dL — ABNORMAL LOW (ref 12.0–15.0)
Immature Granulocytes: 1 %
Lymphocytes Relative: 12 %
Lymphs Abs: 1 10*3/uL (ref 0.7–4.0)
MCH: 28.6 pg (ref 26.0–34.0)
MCHC: 31.5 g/dL (ref 30.0–36.0)
MCV: 90.6 fL (ref 80.0–100.0)
Monocytes Absolute: 0.4 10*3/uL (ref 0.1–1.0)
Monocytes Relative: 5 %
Neutro Abs: 6.5 10*3/uL (ref 1.7–7.7)
Neutrophils Relative %: 82 %
Platelets: 98 10*3/uL — ABNORMAL LOW (ref 150–400)
RBC: 3.29 MIL/uL — ABNORMAL LOW (ref 3.87–5.11)
RDW: 17.2 % — ABNORMAL HIGH (ref 11.5–15.5)
WBC: 7.9 10*3/uL (ref 4.0–10.5)
nRBC: 0.3 % — ABNORMAL HIGH (ref 0.0–0.2)

## 2024-01-17 LAB — BASIC METABOLIC PANEL WITH GFR
Anion gap: 8 (ref 5–15)
BUN: 25 mg/dL — ABNORMAL HIGH (ref 8–23)
CO2: 28 mmol/L (ref 22–32)
Calcium: 7.4 mg/dL — ABNORMAL LOW (ref 8.9–10.3)
Chloride: 98 mmol/L (ref 98–111)
Creatinine, Ser: 3.15 mg/dL — ABNORMAL HIGH (ref 0.44–1.00)
GFR, Estimated: 15 mL/min — ABNORMAL LOW (ref >60)
Glucose, Bld: 111 mg/dL — ABNORMAL HIGH (ref 70–99)
Potassium: 3.6 mmol/L (ref 3.5–5.1)
Sodium: 134 mmol/L — ABNORMAL LOW (ref 135–145)

## 2024-01-17 LAB — MAGNESIUM: Magnesium: 2.3 mg/dL (ref 1.7–2.4)

## 2024-01-17 LAB — PHOSPHORUS: Phosphorus: 4.9 mg/dL — ABNORMAL HIGH (ref 2.5–4.6)

## 2024-01-17 MED ORDER — MIDODRINE HCL 5 MG PO TABS
5.0000 mg | ORAL_TABLET | Freq: Every day | ORAL | 0 refills | Status: DC | PRN
  Filled 2024-01-17: qty 30, 30d supply, fill #0

## 2024-01-17 MED ORDER — APIXABAN 2.5 MG PO TABS
2.5000 mg | ORAL_TABLET | Freq: Two times a day (BID) | ORAL | 0 refills | Status: DC
  Filled 2024-01-17: qty 60, 30d supply, fill #0

## 2024-01-17 MED ORDER — PANTOPRAZOLE SODIUM 40 MG PO TBEC
40.0000 mg | DELAYED_RELEASE_TABLET | Freq: Every day | ORAL | 0 refills | Status: DC
  Filled 2024-01-17: qty 30, 30d supply, fill #0

## 2024-01-17 MED ORDER — MIRTAZAPINE 7.5 MG PO TABS
7.5000 mg | ORAL_TABLET | ORAL | 0 refills | Status: DC
  Filled 2024-01-17: qty 30, 30d supply, fill #0

## 2024-01-17 MED ORDER — PREDNISONE 10 MG PO TABS
ORAL_TABLET | ORAL | 0 refills | Status: DC
Start: 2024-01-17 — End: 2024-02-23
  Filled 2024-01-17: qty 20, 15d supply, fill #0

## 2024-01-17 NOTE — Progress Notes (Signed)
   01/17/24 1205  Vitals  Temp 98.2 F (36.8 C)  Pulse Rate 68  Resp 17  BP 107/67  SpO2 100 %  O2 Device Nasal Cannula  Oxygen Therapy  O2 Flow Rate (L/min) 2 L/min  Patient Activity (if Appropriate) In bed  Pulse Oximetry Type Continuous  Oximetry Probe Site Changed No  Post Treatment  Dialyzer Clearance Lightly streaked  Hemodialysis Intake (mL) 0 mL  Liters Processed 84  Fluid Removed (mL) 0 mL  Tolerated HD Treatment Yes  AVG/AVF Arterial Site Held (minutes) 5 minutes  AVG/AVF Venous Site Held (minutes) 5 minutes   Received patient in bed to unit.  Alert and oriented.  Informed consent signed and in chart.   TX duration: Three hours and thirty minutes.  Patient tolerated well.  Transported back to the room  Alert, without acute distress.  Hand-off given to patient's nurse.   Access used: Left upper arm fistula Access issues: none

## 2024-01-17 NOTE — Plan of Care (Signed)
  Problem: Clinical Measurements: Goal: Ability to maintain clinical measurements within normal limits will improve Outcome: Progressing Goal: Will remain free from infection Outcome: Progressing   Problem: Activity: Goal: Risk for activity intolerance will decrease Outcome: Progressing   Problem: Elimination: Goal: Will not experience complications related to bowel motility Outcome: Progressing   Problem: Skin Integrity: Goal: Risk for impaired skin integrity will decrease Outcome: Progressing

## 2024-01-17 NOTE — Progress Notes (Signed)
 [facility] Progress Note  Subjective:  Seen in room No c/o's today  Vitals:   01/17/24 1120 01/17/24 1150 01/17/24 1158 01/17/24 1205  BP: 109/64 104/63 110/67 107/67  Pulse: 68 66 66 68  Resp: 17 19 16 17   Temp:    98.2 F (36.8 C)  TempSrc:      SpO2: 100% 100% 100% 100%  Weight:      Height:        Exam: General: Frail elderly female, on RA Lungs: Clear bilaterally to auscultation  Heart: S1, S2 2/6 systolic M. No R/G Abdomen: NABS, NT Lower extremities: no LE edema Neuro: Alert and oriented X 2 Dialysis Access: L AVF + T/B   OP HD: [facility]  3h  B350   45.5kg  3K bath  AVF  LUE   Heparin 4000 - Mircera 30 mcg IV Q 4 weeks (last dose 12/30/2023)   CXR 3/30 - IMPRESSION: Small moderate left greater than right pleural effusions similar to 12/20/2023. Basilar airspace opacities may be due to atelectasis or pneumonia.   Assessment/Plan: Aspiration PNA - bilat LL infiltrates per CT. Hypothermia resolved, on IV abx.  Anorexia/ severe malnutrition/ hypoglycemia - pt not eating, A1c low at 4.1, 70 lb wt loss over last year. Started on remeron and prednisone per pmd.   Brief PEA arrest- brief chest compressions, not intubated. ESRD - TTS HD. HD this morning.  BP - bp's soft today after 2.7 L UF on Thursday.  Volume  - no excess volume by exam. CXR's w/o edema. Not eating. Wt's seem inaccurate.  Anemia  - HGB 10.0. Follow HGB.  Metabolic bone disease - CCa ok, phos very low as below. Renvela off. Hypophosphatemia - phos up to 3.4 s/p 2 rounds of IV phos, dc'd renvela Nutrition - Very low albumin. Protein supplements H/O PE on apixaban  Vinson Moselle  MD  [facility] 01/17/2024, 1:36 PM  Recent Labs  Lab 01/11/24 0228 01/12/24 1048 01/16/24 0526 01/17/24 0612  HGB 10.0*   < > 9.6* 9.4*  ALBUMIN 2.5*  --   --   --   CALCIUM 8.5*   < > 7.2* 7.4*  PHOS  --    < > 3.3 4.9*  CREATININE 1.48*   < > 1.98* 3.15*  K 3.4*   < > 3.4* 3.6   < > = values in this  interval not displayed.    Inpatient medications:  carvedilol  3.125 mg Oral BID   Chlorhexidine Gluconate Cloth  6 each Topical Q0600   feeding supplement  237 mL Oral BID BM   heparin injection (subcutaneous)  5,000 Units Subcutaneous Q8H   isosorbide mononitrate  15 mg Oral Daily   leptospermum manuka honey  1 Application Topical Daily   mirtazapine  7.5 mg Oral Q24H   multivitamin  1 tablet Oral Q M,W,F,Sa-1800   pneumococcal 20-valent conjugate vaccine  0.5 mL Intramuscular Tomorrow-1000   predniSONE  20 mg Oral Q breakfast     acetaminophen **OR** acetaminophen, heparin sodium (porcine), heparin sodium (porcine), hydrALAZINE, midodrine

## 2024-01-17 NOTE — Progress Notes (Signed)
 PROGRESS NOTE                                                                                                                                                                                                             Patient Demographics:    Kara Mullins, is a 73 y.o. female, DOB - 1951/06/09, HYQ:[id number]  Outpatient Primary MD for the patient is Mullins, Kara Citrin, NP    LOS - 6  Admit date - 01/11/2024    Chief Complaint  Patient presents with   Shortness of Breath       Brief Narrative (HPI from H&P)      73 y.o. female with medical history significant of ESRD on HD, heart failure, hypertension and hyperlipidemia who presented with dyspnea and failure to thrive.  She lives with her daughter who is her caregiver. Patient was recently hospitalized 03/1 to 12/16/23 for influenza A infection with bacterial pneumonia superinfection. She was treated with oseltamivir and antibiotic therapy.  Patient at home continue to be very weak and deconditioned. Very poor oral intake, progressive less mobility and worsening dyspnea on exertion.  She has been using a wheelchair for over one year.    03/08 ED visit for dyspnea, CT chest was done, and resulted positive for pulmonary embolism, subsegmental right upper lobe. Small thromboembolic burden. Placed on apixaban for anticoagulation,    03/11 Cardiology follow up recommended to continue furosemide on non HD days.     At home her daughter noted patient's blood pressure bing low over the last 6 days, on 03/25 HD her daughter was told that her mother's blood pressure was not low. Yesterday patient had dialysis, when reached home she continue very weak and deconditioned.  He was brought to the hospital for weakness, hypotension and shortness of breath.  Workup consistent with pneumonia, she was found to be extremely hypotensive required pressors in ICU, stabilized transferred to my care  on 01/13/2024.   Subjective:    [name] significant events overnight, she tolerated her HD well today, overall appetite has much improved but she reports that she feels more sleepy on mirtazapine.    Assessment  & Plan :   Aspiration pneumonia of left lower lobe (HCC) - patient very weak and deconditioned. Chest CT with left lower lobe infiltrate suggestive aspiration,, completing 5 days of Unasyn, speech  therapy following.  Pneumonia clinically much improved. -He was encouraged to use incentive spirometry and flutter valve  Hypotension caused by severe dehydration, decreased oral intake, diuretic dose unknown HD days -she was admitted to ICU, received gentle IV fluids, along with IV hydrocortisone, blood pressure has stabilized off of IV fluids and steroids.  Discontinue Lasix use upon discharge. -Discussed with daughter, likely she will need to hold her Imdur and Coreg at the morning of dialysis as an outpatient -As well she will be provided with as needed midodrine.   Hypoglycemia  Severe protein calorie malnutrition  patient had hypoglycemia episode, triggering bradycardia and less responsiveness, Code blue was called, she received 2 chest compression, for weak pulse, no fran cardiac arrest. She received D50 with improvement in her hemodynamics.   -Cortisol and TSH within normal limit  -A1c  is significantly low at 4.1 m daughter report patient with very poor oral intake significant weight loss around 70 pounds over last year or so  - Appetite has improved on Remeron, but report makes her sleepy in the morning, so I will give a dose earlier at 8 PM. - Will continue with the prednisone, it was started to improve appetite, I would lower the dose. -Diet appears with some improvement after starting prednisone and Remeron   CBG (last 3)  Recent Labs    01/15/24 0725 01/15/24 1256 01/15/24 1625  GLUCAP 78 169* 129*   Lab Results  Component Value Date   HGBA1C 4.1 (L) 01/13/2024     ESRD on hemodialysis (HCC)  -   Renal following, TTS schedule, Access left arm fistula   Chronic diastolic CHF (congestive heart failure) (HCC) preserved at 65%.  Fluid removal via HD.  Due to severe hypotension upon admission discontinue Lasix use on non-HD days, if blood pressure is stable will cautiously resume Coreg .   Severe protein-calorie malnutrition (HCC)  Consult nutrition, her po intake has significantly decreased.  Very poor prognosis.  See above discussion about adding Remeron and steroids   Essential hypertension Hypotension on this admission.  Blood pressure improving, overall it is soft on dialysis days, so I discussed with daughter to hold Imdur and Coreg at morning of dialysis as an outpatient, still needing as needed midodrine pre and postdialysis.   Recently diagnosed PE.  CTA this admission shows resolution of PE.  - No need for Eliquis for the next few days, was very small on previous CTA few weeks ago, platelet count remains low, at this point it does look anticoagulation risks outweighed benefits especially with confirmed resolution of her small subsegmental PE, her platelet count has improved, and she is sedentary, so we will start Eliquis only DVT prophylaxis dose 2.5 mg p.o. twice daily, not full treatment dose.  Heel ulcer, present on admission.  [facility])  Not able to stage. Present on admission, Consult wound care team.   Subpleural nodule -Stable on repeat imaging, will need further workup as an outpatient, but meanwhile she was encouraged use incentive spirometry and flutter valve  Hypophosphatemia -Replaced, Renvela was discontinued  Chronic underlying weakness and deconditioning.  Bed and wheelchair bound.  Supportive care.      Condition - Extremely Guarded  Family Communication  : None at bedside  Code Status : Full code  Consults  : Nephrology, PCCM  PUD Prophylaxis :     Procedures  :     TTE - 1. Left ventricular ejection fraction, by  estimation, is 60 to 65%. The left ventricle has  normal function. The left ventricle has no regional wall motion abnormalities. There is mild concentric left ventricular hypertrophy. Left ventricular diastolic parameters are consistent with Grade I diastolic dysfunction (impaired relaxation).  2. Right ventricular systolic function is normal. The right ventricular size is normal. There is normal pulmonary artery systolic pressure. The estimated right ventricular systolic pressure is 31.3 mmHg.  3. The mitral valve is degenerative. No evidence of mitral valve regurgitation. No evidence of mitral stenosis. Moderate mitral annular calcification.  4. The aortic valve is tricuspid. There is mild calcification of the aortic valve. Aortic valve regurgitation is trivial. Aortic valve sclerosis/calcification is present, without any evidence of aortic stenosis.  5. The inferior vena cava is normal in size with greater than 50% respiratory variability, suggesting right atrial pressure of 3 mmHg.      Disposition Plan  :    Status is: Inpatient  DVT Prophylaxis  :    Place and maintain sequential compression device Start: 01/13/24 1017 heparin injection 5,000 Units Start: 01/12/24 1400 Place and maintain sequential compression device Start: 01/11/24 1543 SCDs Start: 01/11/24 1610    Lab Results  Component Value Date   PLT 98 (L) 01/17/2024    Diet :  Diet Order             Diet regular Room service appropriate? Yes; Fluid consistency: Thin  Diet effective now                    Inpatient Medications  Scheduled Meds:  carvedilol  3.125 mg Oral BID   Chlorhexidine Gluconate Cloth  6 each Topical Q0600   feeding supplement  237 mL Oral BID BM   heparin injection (subcutaneous)  5,000 Units Subcutaneous Q8H   isosorbide mononitrate  15 mg Oral Daily   leptospermum manuka honey  1 Application Topical Daily   mirtazapine  7.5 mg Oral Q24H   multivitamin  1 tablet Oral Q M,W,F,Sa-1800    pneumococcal 20-valent conjugate vaccine  0.5 mL Intramuscular Tomorrow-1000   predniSONE  20 mg Oral Q breakfast   Continuous Infusions:   PRN Meds:.acetaminophen **OR** acetaminophen, heparin sodium (porcine), heparin sodium (porcine), hydrALAZINE, midodrine    Objective:   Vitals:   01/17/24 1150 01/17/24 1158 01/17/24 1205 01/17/24 1324  BP: 104/63 110/67 107/67 101/61  Pulse: 66 66 68 68  Resp: 19 16 17 13   Temp:   98.2 F (36.8 C) 97.6 F (36.4 C)  TempSrc:    Oral  SpO2: 100% 100% 100% 99%  Weight:      Height:        Wt Readings from Last 3 Encounters:  01/17/24 53.7 kg  12/30/23 48.1 kg  12/23/23 48.1 kg     Intake/Output Summary (Last 24 hours) at 01/17/2024 1514 Last data filed at 01/17/2024 1205 Gross per 24 hour  Intake --  Output 0 ml  Net 0 ml     Physical Exam  Awake Alert, Oriented X 3, frail, no apparent distress, chronically ill appearing  Symmetrical Chest wall movement, Good air movement bilaterally, CTAB RRR,No Gallops,Rubs or new Murmurs, No Parasternal Heave +ve B.Sounds, Abd Soft, No tenderness, No rebound - guarding or rigidity. No Cyanosis,  bilateral lower extremity with floating boots     RN pressure injury documentation: Pressure Injury 01/11/24 Sacrum Stage 2 -  Partial thickness loss of dermis presenting as a shallow open injury with a red, pink wound bed without slough. (Active)  01/11/24 0900  Location: Sacrum  Location  Orientation:   Staging: Stage 2 -  Partial thickness loss of dermis presenting as a shallow open injury with a red, pink wound bed without slough.  Wound Description (Comments):   Present on Admission: Yes  Dressing Type Foam - Lift dressing to assess site every shift;Honey 01/17/24 0515     Pressure Injury 01/11/24 Buttocks Right;Medial Stage 2 -  Partial thickness loss of dermis presenting as a shallow open injury with a red, pink wound bed without slough. (Active)  01/11/24 0900  Location: Buttocks   Location Orientation: Right;Medial  Staging: Stage 2 -  Partial thickness loss of dermis presenting as a shallow open injury with a red, pink wound bed without slough.  Wound Description (Comments):   Present on Admission: Yes  Dressing Type Foam - Lift dressing to assess site every shift;Honey 01/17/24 0515     Pressure Injury 01/11/24 Buttocks Left;Medial;Lower Stage 2 -  Partial thickness loss of dermis presenting as a shallow open injury with a red, pink wound bed without slough. (Active)  01/11/24 0900  Location: Buttocks  Location Orientation: Left;Medial;Lower  Staging: Stage 2 -  Partial thickness loss of dermis presenting as a shallow open injury with a red, pink wound bed without slough.  Wound Description (Comments):   Present on Admission: Yes  Dressing Type Foam - Lift dressing to assess site every shift 01/16/24 0000     Pressure Injury 01/11/24 Heel Left;Posterior;Mid Unstageable - Full thickness tissue loss in which the base of the injury is covered by slough (yellow, tan, gray, green or brown) and/or eschar (tan, brown or black) in the wound bed. yellow/white slough i (Active)  01/11/24 0900  Location: Heel  Location Orientation: Left;Posterior;Mid  Staging: Unstageable - Full thickness tissue loss in which the base of the injury is covered by slough (yellow, tan, gray, green or brown) and/or eschar (tan, brown or black) in the wound bed.  Wound Description (Comments): yellow/white slough in middle, dark hard black skin surrounding  Present on Admission: Yes  Dressing Type Foam - Lift dressing to assess site every shift;Other (Comment) (xerofoam) 01/17/24 0515      Data Review:    Recent Labs  Lab 01/13/24 0503 01/14/24 0506 01/15/24 1007 01/16/24 0526 01/17/24 0612  WBC 7.2 5.1 4.7 6.8 7.9  HGB 9.9* 8.8* 10.1* 9.6* 9.4*  HCT 31.4* 26.8* 32.0* 30.3* 29.8*  PLT 65* 72* 77* 91* 98*  MCV 88.2 87.3 88.2 88.6 90.6  MCH 27.8 28.7 27.8 28.1 28.6  MCHC 31.5 32.8  31.6 31.7 31.5  RDW 16.5* 16.8* 16.7* 16.8* 17.2*  LYMPHSABS  --  0.8 0.4* 1.0 1.0  MONOABS  --  0.3 0.1 0.4 0.4  EOSABS  --  0.1 0.0 0.0 0.0  BASOSABS  --  0.0 0.0 0.0 0.0    Recent Labs  Lab 01/11/24 0228 01/11/24 0432 01/11/24 1622 01/12/24 1048 01/13/24 0502 01/13/24 0503 01/13/24 0748 01/13/24 1023 01/14/24 0506 01/15/24 1007 01/15/24 1022 01/16/24 0526 01/17/24 0612  NA 131*  --   --    < >  --  127*  --   --  129* 131*  --  133* 134*  K 3.4*  --   --    < >  --  3.5  --   --  3.4* 3.7  --  3.4* 3.6  CL 98  --   --    < >  --  94*  --   --  96* 95*  --  95*  98  CO2 25  --   --    < >  --  22  --   --  26 26  --  29 28  ANIONGAP 8  --   --    < >  --  11  --   --  7 10  --  9 8  GLUCOSE 77  --   --    < >  --  108*  --   --  107* 157*  --  93 111*  BUN 10  --   --    < >  --  23  --   --  13 8  --  16 25*  CREATININE 1.48*  --   --    < >  --  2.54*  --   --  1.80* 1.49*  --  1.98* 3.15*  AST 28  --   --   --   --   --   --   --   --   --   --   --   --   ALT 18  --   --   --   --   --   --   --   --   --   --   --   --   ALKPHOS 69  --   --   --   --   --   --   --   --   --   --   --   --   BILITOT 0.6  --   --   --   --   --   --   --   --   --   --   --   --   ALBUMIN 2.5*  --   --   --   --   --   --   --   --   --   --   --   --   CRP  --   --   --   --   --   --  4.4*  --   --   --   --   --   --   PROCALCITON  --  0.72  --   --   --   --   --   --   --   --   --   --   --   LATICACIDVEN  --   --  1.0  --   --   --   --   --   --   --   --   --   --   INR 1.4*  --   --   --   --   --   --   --   --   --   --   --   --   TSH  --   --   --   --   --   --   --  1.930  --   --   --   --   --   HGBA1C  --   --   --   --   --  4.1*  --   --   --   --   --   --   --   BNP  --   --   --   --  1,016.0*  --   --   --  926.2*  --  718.4* 946.1* 1,285.8*  MG  --   --   --   --   --  2.0  --   --  1.9 1.9  --  2.2 2.3  PHOS  --   --   --   --   --  1.5*  --   --  1.2* 2.1*   --  3.3 4.9*  CALCIUM 8.5*  --   --    < >  --  7.9*  --   --  7.5* 7.6*  --  7.2* 7.4*   < > = values in this interval not displayed.      Recent Labs  Lab 01/11/24 0228 01/11/24 0432 01/11/24 1622 01/12/24 1048 01/13/24 0502 01/13/24 0503 01/13/24 0748 01/13/24 1023 01/14/24 0506 01/15/24 1007 01/15/24 1022 01/16/24 0526 01/17/24 0612  CRP  --   --   --   --   --   --  4.4*  --   --   --   --   --   --   PROCALCITON  --  0.72  --   --   --   --   --   --   --   --   --   --   --   LATICACIDVEN  --   --  1.0  --   --   --   --   --   --   --   --   --   --   INR 1.4*  --   --   --   --   --   --   --   --   --   --   --   --   TSH  --   --   --   --   --   --   --  1.930  --   --   --   --   --   HGBA1C  --   --   --   --   --  4.1*  --   --   --   --   --   --   --   BNP  --   --   --   --  1,016.0*  --   --   --  926.2*  --  718.4* 946.1* 1,285.8*  MG  --   --   --   --   --  2.0  --   --  1.9 1.9  --  2.2 2.3  CALCIUM 8.5*  --   --    < >  --  7.9*  --   --  7.5* 7.6*  --  7.2* 7.4*   < > = values in this interval not displayed.      Micro Results Recent Results (from the past 240 hours)  MRSA Next Gen by PCR, Nasal     Status: None   Collection Time: 01/11/24  3:19 PM   Specimen: Nasal Mucosa; Nasal Swab  Result Value Ref Range Status   MRSA by PCR Next Gen NOT DETECTED NOT DETECTED Final    Comment: (NOTE) The GeneXpert MRSA Assay (FDA approved for NASAL specimens only), is one component of a comprehensive MRSA colonization surveillance program. It is not intended to diagnose MRSA infection nor to guide or monitor treatment for MRSA infections. Test performance is not FDA approved in patients less than 43 years old. Performed at  [facility], [address] [place], [place] [address]     Radiology Report No results found.    Signature  -   Huey Bienenstock M.D on 01/17/2024 at 3:14 PM   -  To page go to [email or web address]

## 2024-01-18 DIAGNOSIS — J690 Pneumonitis due to inhalation of food and vomit: Secondary | ICD-10-CM | POA: Diagnosis not present

## 2024-01-18 MED ORDER — LOPERAMIDE HCL 2 MG PO CAPS
2.0000 mg | ORAL_CAPSULE | Freq: Four times a day (QID) | ORAL | Status: DC | PRN
  Administered 2024-01-18: 2 mg via ORAL
  Filled 2024-01-18: qty 1

## 2024-01-18 MED ORDER — MIDODRINE HCL 5 MG PO TABS: ORAL_TABLET | ORAL | Status: DC

## 2024-01-18 MED ORDER — MIDODRINE HCL 5 MG PO TABS: 10.0000 mg | ORAL_TABLET | Freq: Once | ORAL | Status: DC

## 2024-01-18 MED ORDER — CARVEDILOL 3.125 MG PO TABS: ORAL_TABLET | ORAL | Status: DC

## 2024-01-18 NOTE — Plan of Care (Signed)
?  Problem: Clinical Measurements: ?Goal: Will remain free from infection ?Outcome: Progressing ?Goal: Diagnostic test results will improve ?Outcome: Progressing ?Goal: Respiratory complications will improve ?Outcome: Progressing ?  ?

## 2024-01-18 NOTE — Discharge Summary (Signed)
 Physician Discharge Summary  Graycie Halley JYN:[id number] DOB: Dec 22, 1950 DOA: 01/11/2024  PCP: Caesar Bookman, NP  Admit date: 01/11/2024 Discharge date: 01/18/2024  Admitted From: (Home) Disposition:  (Home )  Recommendations for Outpatient Follow-up:  Follow up with PCP in 1-2 weeks Please obtain BMP/CBC in one week Please uptitrate mirtazapine Uptitrate midodrine if needed Repeat CT chest in 4 to 6 weeks to ensure resolution of subpleural nodule  Home Health: (YES)  Diet recommendation: Regular diet  Brief/Interim Summary:   73 y.o. female with medical history significant of ESRD on HD, heart failure, hypertension and hyperlipidemia who presented with dyspnea and failure to thrive.  She lives with her daughter who is her caregiver. Patient was recently hospitalized 03/1 to 12/16/23 for influenza A infection with bacterial pneumonia superinfection. She was treated with oseltamivir and antibiotic therapy.  Patient at home continue to be very weak and deconditioned. Very poor oral intake, progressive less mobility and worsening dyspnea on exertion.  She has been using a wheelchair for over one year.    03/08 ED visit for dyspnea, CT chest was done, and resulted positive for pulmonary embolism, subsegmental right upper lobe. Small thromboembolic burden. Placed on apixaban for anticoagulation,    03/11 Cardiology follow up recommended to continue furosemide on non HD days.     At home her daughter noted patient's blood pressure bing low over the last 6 days, on 03/25 HD her daughter was told that her mother's blood pressure was not low. Yesterday patient had dialysis, when reached home she continue very weak and deconditioned.  He was brought to the hospital for weakness, hypotension and shortness of breath.   Workup consistent with pneumonia, she was found to be extremely hypotensive required pressors in ICU, stabilized transferred to my care on 01/13/2024.   Aspiration pneumonia of  left lower lobe (HCC)  - patient very weak and deconditioned. Chest CT with left lower lobe infiltrate suggestive aspiration,, completED 5 days of Unasyn, speech therapy following.  Pneumonia clinically much improved. -He was encouraged to use incentive spirometry and flutter valve   Hypotension caused by severe dehydration, decreased oral intake, diuretic dose unknown HD days - -she was admitted to ICU, received gentle IV fluids, along with IV hydrocortisone, blood pressure has stabilized off of IV fluids and steroids. -Overall she is with low blood pressure at baseline, she is on as needed midodrine during hospital stay, will be discharged on 5 mg oral daily. -Instructed to discontinue Imdur, and hold Coreg in the morning of hemodialysis days   Hypoglycemia  Severe protein calorie malnutrition  patient had hypoglycemia episode, triggering bradycardia and less responsiveness, Code blue was called, she received 2 chest compression, for weak pulse, no fran cardiac arrest. She received D50 with improvement in her hemodynamics.   -Cortisol and TSH within normal limit  -A1c  is significantly low at 4.1 m daughter report patient with very poor oral intake significant weight loss around 70 pounds over last year or so  - Appetite has improved on Remeron, but report makes her sleepy in the morning, it was given earlier -She was started on steroids as well which has improved her appetite. - She will be discharged on prednisone taper, will continue mirtazapine on discharge, uptitrate if she tolerates (only side effect causing her to be sleepy earlier in the day, but overall it worked very well for her mood and appetite)  ESRD on hemodialysis (HCC)  -   Renal following, TTS schedule, Access left arm fistula  Chronic diastolic CHF (congestive heart failure) (HCC) preserved at 65%.  Fluid removal via HD.  Due to severe hypotension upon admission discontinue Lasix use on non-HD days, if blood pressure is  stable will cautiously resume Coreg .   Severe protein-calorie malnutrition (HCC)  Consult nutrition, her po intake has significantly decreased.  Very poor prognosis.  See above discussion about adding Remeron and steroids   Essential hypertension Hypotension on this admission.  Blood pressure improving, overall it is soft on dialysis days, so I discussed with daughter to hold Imdur together and Coreg on hemodialysis days, and to take midodrine on dialysis day as well, and as needed.   Recently diagnosed PE.  CTA this admission shows resolution of PE.  - Eliquis has been hold on admission given thrombocytopenia, and this admission CTA was negative for PE, overall he is sedentary, nonambulatory, so high risk for recurrent DVT, so she will be discharged on Eliquis DVT prophylaxis not full treatment dose, 2.5 mg p.o. twice daily .    Heel ulcer, present on admission.  [facility])  Not able to stage. Present on admission, care consulted, to continue wound care on discharge     Subpleural nodule -Stable on repeat imaging, will need further workup as an outpatient, but meanwhile she was encouraged use incentive spirometry and flutter valve   Hypophosphatemia -Replaced, Renvela was discontinued   Chronic underlying weakness and deconditioning.  Bed and wheelchair bound.  Supportive care.  Pressure ulcer POA Pressure Injury 01/11/24 Sacrum Stage 2 -  Partial thickness loss of dermis presenting as a shallow open injury with a red, pink wound bed without slough. (Active)  01/11/24 0900  Location: Sacrum  Location Orientation:   Staging: Stage 2 -  Partial thickness loss of dermis presenting as a shallow open injury with a red, pink wound bed without slough.  Wound Description (Comments):   Present on Admission: Yes     Pressure Injury 01/11/24 Buttocks Right;Medial Stage 2 -  Partial thickness loss of dermis presenting as a shallow open injury with a red, pink wound bed without slough. (Active)   01/11/24 0900  Location: Buttocks  Location Orientation: Right;Medial  Staging: Stage 2 -  Partial thickness loss of dermis presenting as a shallow open injury with a red, pink wound bed without slough.  Wound Description (Comments):   Present on Admission: Yes     Pressure Injury 01/11/24 Buttocks Left;Medial;Lower Stage 2 -  Partial thickness loss of dermis presenting as a shallow open injury with a red, pink wound bed without slough. (Active)  01/11/24 0900  Location: Buttocks  Location Orientation: Left;Medial;Lower  Staging: Stage 2 -  Partial thickness loss of dermis presenting as a shallow open injury with a red, pink wound bed without slough.  Wound Description (Comments):   Present on Admission: Yes     Pressure Injury 01/11/24 Heel Left;Posterior;Mid Unstageable - Full thickness tissue loss in which the base of the injury is covered by slough (yellow, tan, gray, green or brown) and/or eschar (tan, brown or black) in the wound bed. yellow/white slough i (Active)  01/11/24 0900  Location: Heel  Location Orientation: Left;Posterior;Mid  Staging: Unstageable - Full thickness tissue loss in which the base of the injury is covered by slough (yellow, tan, gray, green or brown) and/or eschar (tan, brown or black) in the wound bed.  Wound Description (Comments): yellow/white slough in middle, dark hard black skin surrounding  Present on Admission: Yes     Discharge Diagnoses:  Principal  Problem:   Aspiration pneumonia of left lower lobe (HCC) Active Problems:   Hypoglycemia   ESRD on hemodialysis (HCC)   Chronic diastolic CHF (congestive heart failure) (HCC)   Essential hypertension   Severe protein-calorie malnutrition (HCC)   Heel ulcer (HCC)   Community acquired pneumonia   Malnutrition of moderate degree    Discharge Instructions  Discharge Instructions     Diet - low sodium heart healthy   Complete by: As directed    Discharge instructions   Complete by: As  directed    Follow with Primary MD [name], [name], NP in 7 days   Get CBC, CMP,  checked  by Primary MD next visit.    Activity: As tolerated with Full fall precautions use walker/cane & assistance as needed   Disposition Home   Diet: Regular diet  On your next visit with your primary care physician please Get Medicines reviewed and adjusted.   Please request your Prim.MD to go over all Hospital Tests and Procedure/Radiological results at the follow up, please get all Hospital records sent to your Prim MD by signing hospital release before you go home.   If you experience worsening of your admission symptoms, develop shortness of breath, life threatening emergency, suicidal or homicidal thoughts you must seek medical attention immediately by calling 911 or calling your MD immediately  if symptoms less severe.  You Must read complete instructions/literature along with all the possible adverse reactions/side effects for all the Medicines you take and that have been prescribed to you. Take any new Medicines after you have completely understood and accpet all the possible adverse reactions/side effects.   Do not drive, operating heavy machinery, perform activities at heights, swimming or participation in water activities or provide baby sitting services if your were admitted for syncope or siezures until you have seen by Primary MD or a Neurologist and advised to do so again.  Do not drive when taking Pain medications.    Do not take more than prescribed Pain, Sleep and Anxiety Medications  Special Instructions: If you have smoked or chewed Tobacco  in the last 2 yrs please stop smoking, stop any regular Alcohol  and or any Recreational drug use.  Wear Seat belts while driving.   Please note  You were cared for by a hospitalist during your hospital stay. If you have any questions about your discharge medications or the care you received while you were in the hospital after you are  discharged, you can call the unit and asked to speak with the hospitalist on call if the hospitalist that took care of you is not available. Once you are discharged, your primary care physician will handle any further medical issues. Please note that NO REFILLS for any discharge medications will be authorized once you are discharged, as it is imperative that you return to your primary care physician (or establish a relationship with a primary care physician if you do not have one) for your aftercare needs so that they can reassess your need for medications and monitor your lab values.   Discharge wound care:   Complete by: As directed    Wound care  Daily      Comments: Cleanse sacrum and buttocks (intact skin and open wounds) with Vashe wound cleanser Hart [place] [phone]), do not rinse and allow to air dry.  Apply Medihoney to wound beds daily, cover with dry gauze.  May cover area with silicone foam or ABD pad and tape.  01/11/24 1600    Wound care  Daily      Comments: Cleanse L heel with Vashe wound cleanser Hart [place] [phone]), do not rinse and allow to air dry. Apply Xeroform gauze to wound bed daily Hart [place] [phone]) cover with dry gauze and Kerlix roll gauze. Place L foot in Prevalon boot to offload pressure  Hart [place] [phone]).   Increase activity slowly   Complete by: As directed       Allergies as of 01/18/2024       Reactions   Crestor [rosuvastatin] Shortness Of Breath   Nitrofuran Derivatives Nausea Only        Medication List     STOP taking these medications    Apixaban Starter Pack (10mg  and 5mg ) Commonly known as: ELIQUIS STARTER PACK Replaced by: Eliquis 2.5 MG Tabs tablet   furosemide 80 MG tablet Commonly known as: LASIX   isosorbide mononitrate 30 MG 24 hr tablet Commonly known as: IMDUR   sevelamer carbonate 800 MG tablet Commonly known as: RENVELA       TAKE these medications    carvedilol 3.125 MG tablet Commonly known as: COREG Please take 3.125 mg p.o.  twice daily, but hold morning dose on dialysis days TTS AM. What changed:  how much to take how to take this when to take this additional instructions   Eliquis 2.5 MG Tabs tablet Generic drug: apixaban Take 1 tablet (2.5 mg total) by mouth 2 (two) times daily. Replaces: Apixaban Starter Pack (10mg  and 5mg )   fluticasone 50 MCG/ACT nasal spray Commonly known as: FLONASE Place 2 sprays into both nostrils daily.   lidocaine-prilocaine cream Commonly known as: EMLA APPLY 1 APPLICATION TOPICALLY AS NEEDED.(USE 30 MINUTES PRIOR TO DIALYSIS).   midodrine 5 MG tablet Commonly known as: PROAMATINE Take as needed for systolic blood pressure less than 100, and take morning during dialysis days.   mirtazapine 7.5 MG tablet Commonly known as: REMERON Take 1 tablet (7.5 mg total) by mouth daily.   multivitamin Tabs tablet Take 1 tablet by mouth daily. What changed:  when to take this additional instructions   Muscle Rub 10-15 % Crea Apply 1 Application topically daily as needed for muscle pain.   OVER THE COUNTER MEDICATION Take 1 fluid ounce by mouth daily. Liquid Cell Protein   pantoprazole 40 MG tablet Commonly known as: Protonix Take 1 tablet (40 mg total) by mouth daily.   predniSONE 10 MG tablet Commonly known as: DELTASONE Take two tablets by mouth daily with breakfast for 5 days, then take one tablet by mouth daily with breakfast for 5 days, then take one-half tablet daily with breakfast for 5 days, then stop.   silver sulfADIAZINE 1 % cream Commonly known as: Silvadene Apply 1 Application topically daily.               Discharge Care Instructions  (From admission, onward)           Start     Ordered   01/18/24 0000  Discharge wound care:       Comments: Wound care  Daily      Comments: Cleanse sacrum and buttocks (intact skin and open wounds) with Vashe wound cleanser Hart [place] [phone]), do not rinse and allow to air dry.  Apply Medihoney to wound beds  daily, cover with dry gauze.  May cover area with silicone foam or ABD pad and tape.    01/11/24 1600    Wound care  Daily      Comments:  Cleanse L heel with Vashe wound cleanser Hart [place] [phone]), do not rinse and allow to air dry. Apply Xeroform gauze to wound bed daily Hart [place] [phone]) cover with dry gauze and Kerlix roll gauze. Place L foot in Prevalon boot to offload pressure  Hart [place] [phone]).   01/18/24 1148            Follow-up Information     [facility] [place] Follow up.   Why: [facility] will contact you after discharge to resume home health Contact information: [address] [place] [address] [phone]                Allergies  Allergen Reactions   Crestor [Rosuvastatin] Shortness Of Breath   Nitrofuran Derivatives Nausea Only    Consultations: Neurology   Procedures/Studies: DG Chest Port 1 View Result Date: 01/13/2024 CLINICAL DATA:  [id number] with shortness of breath, admitted for aspiration pneumonia left lower lobe. EXAM: PORTABLE CHEST 1 VIEW COMPARISON:  Portable chest 01/11/2024 at 2:31 a.m. FINDINGS: 7:26 a.m. Small bilateral pleural effusions are again noted, grossly improved on the left. Patchy airspace disease is still seen in the left base but is less confluent and less dense today. Scattered opacities in the right lower lung field are unchanged consistent with atelectasis or small areas of pneumonia. There are overlying skin folds on the left with remaining lungs generally clear. The heart is enlarged, stable. No vascular congestion or edema is seen. The mediastinum is unchanged with calcification of the transverse aorta. Osteopenia and degenerative change of the spine. IMPRESSION: 1. Small bilateral pleural effusions, grossly improved on the left. 2. Improved left basilar airspace disease. 3. Scattered opacities in the right lower lung field are unchanged consistent with atelectasis or small areas of pneumonia. 4. Stable  cardiomegaly. 5. Aortic atherosclerosis. Electronically Signed   By: Almira Bar M.D.   On: 01/13/2024 07:52   ECHOCARDIOGRAM LIMITED Result Date: 01/12/2024    ECHOCARDIOGRAM LIMITED REPORT   Patient Name:   NOVIS LEAGUE Date of Exam: 01/12/2024 Medical Rec #:  [id number]      Height:       65.0 in Accession #:    [id number]     Weight:       112.4 lb Date of Birth:  Mar 31, 1951     BSA:          1.549 m Patient Age:    72 years       BP:           166/81 mmHg Patient Gender: F              HR:           69 bpm. Exam Location:  Inpatient Procedure: Limited Echo, Cardiac Doppler and Color Doppler (Both Spectral and            Color Flow Doppler were utilized during procedure). Indications:    Pulmonary hypertension  History:        Patient has prior history of Echocardiogram examinations, most                 recent 06/12/2023. Risk Factors:Hypertension.  Sonographer:    Karma Ganja Referring Phys: [id number] Lorin Glass  Sonographer Comments: Technically challenging study due to limited acoustic windows. Image acquisition challenging due to patient body habitus. IMPRESSIONS  1. Left ventricular ejection fraction, by estimation, is 60 to 65%. The left ventricle has normal function. The left ventricle has no regional wall motion abnormalities. There  is mild concentric left ventricular hypertrophy. Left ventricular diastolic parameters are consistent with Grade I diastolic dysfunction (impaired relaxation).  2. Right ventricular systolic function is normal. The right ventricular size is normal. There is normal pulmonary artery systolic pressure. The estimated right ventricular systolic pressure is 31.3 mmHg.  3. The mitral valve is degenerative. No evidence of mitral valve regurgitation. No evidence of mitral stenosis. Moderate mitral annular calcification.  4. The aortic valve is tricuspid. There is mild calcification of the aortic valve. Aortic valve regurgitation is trivial. Aortic valve  sclerosis/calcification is present, without any evidence of aortic stenosis.  5. The inferior vena cava is normal in size with greater than 50% respiratory variability, suggesting right atrial pressure of 3 mmHg. Comparison(s): Prior images reviewed side by side. Left pleural effusion has resolved. FINDINGS  Left Ventricle: Left ventricular ejection fraction, by estimation, is 60 to 65%. The left ventricle has normal function. The left ventricle has no regional wall motion abnormalities. There is mild concentric left ventricular hypertrophy. Left ventricular diastolic parameters are consistent with Grade I diastolic dysfunction (impaired relaxation). Right Ventricle: The right ventricular size is normal. No increase in right ventricular wall thickness. Right ventricular systolic function is normal. There is normal pulmonary artery systolic pressure. The tricuspid regurgitant velocity is 2.66 m/s, and  with an assumed right atrial pressure of 3 mmHg, the estimated right ventricular systolic pressure is 31.3 mmHg. Left Atrium: Left atrial size was normal in size. Right Atrium: Right atrial size was normal in size. Mitral Valve: The mitral valve is degenerative in appearance. Moderate mitral annular calcification. No evidence of mitral valve stenosis. Tricuspid Valve: The tricuspid valve is normal in structure. Tricuspid valve regurgitation is trivial. Aortic Valve: The aortic valve is tricuspid. There is mild calcification of the aortic valve. Aortic valve regurgitation is trivial. Aortic valve sclerosis/calcification is present, without any evidence of aortic stenosis. Pulmonic Valve: The pulmonic valve was grossly normal. Pulmonic valve regurgitation is not visualized. No evidence of pulmonic stenosis. Aorta: The aortic root and ascending aorta are structurally normal, with no evidence of dilitation. Venous: The inferior vena cava is normal in size with greater than 50% respiratory variability, suggesting right  atrial pressure of 3 mmHg. Additional Comments: Spectral Doppler performed. Color Doppler performed.  LEFT VENTRICLE PLAX 2D LVIDd:         4.30 cm LVIDs:         3.00 cm LV PW:         1.10 cm LV IVS:        1.20 cm LVOT diam:     1.90 cm LVOT Area:     2.84 cm  RIGHT VENTRICLE             IVC RV Basal diam:  4.10 cm     IVC diam: 1.20 cm RV S prime:     15.70 cm/s TAPSE (M-mode): 2.4 cm LEFT ATRIUM         Index       RIGHT ATRIUM           Index LA diam:    4.00 cm 2.58 cm/m  RA Area:     19.40 cm                                 RA Volume:   51.20 ml  33.06 ml/m   AORTA Ao Root diam: 3.20 cm TRICUSPID VALVE TR Peak grad:  28.3 mmHg TR Vmax:        266.00 cm/s  SHUNTS Systemic Diam: 1.90 cm Rachelle Hora Croitoru MD Electronically signed by Thurmon Fair MD Signature Date/Time: 01/12/2024/3:33:09 PM    Final    CT Angio Chest PE W and/or Wo Contrast Result Date: 01/11/2024 CLINICAL DATA:  Shortness of breath EXAM: CT ANGIOGRAPHY CHEST WITH CONTRAST TECHNIQUE: Multidetector CT imaging of the chest was performed using the standard protocol during bolus administration of intravenous contrast. Multiplanar CT image reconstructions and MIPs were obtained to evaluate the vascular anatomy. RADIATION DOSE REDUCTION: This exam was performed according to the departmental dose-optimization program which includes automated exposure control, adjustment of the mA and/or kV according to patient size and/or use of iterative reconstruction technique. CONTRAST:  75mL OMNIPAQUE IOHEXOL 350 MG/ML SOLN COMPARISON:  12/20/2023 FINDINGS: Cardiovascular: Satisfactory opacification of the pulmonary arteries to the segmental level. No evidence of pulmonary embolism, right upper lobe embolism on prior is resolved. Enlarged heart size. No pericardial effusion. Extensive atheromatous calcification of the aorta and coronaries. Mediastinum/Nodes: Negative for mass or adenopathy. Upper esophageal fluid level. Lungs/Pleura: There is chronic  opacity in the subpleural lower lobes and lingula, worsening opacification at both lung bases, especially in the left lower lobe where there is now multifocal bronchial opacification and obstruction. Continued rounded atelectasis appearance to the lingular opacity and right lower lobe opacity. Upper Abdomen: Extensive arterial calcification.  No acute finding Musculoskeletal: Body wall edema. Exaggerated thoracic kyphosis and mild scoliosis. Review of the MIP images confirms the above findings. IMPRESSION: 1. Bronchial airway filling the lung bases since recent comparison with pneumonia superimposed on chronic lung disease at the left base. Question interval aspiration. 2. Pulmonary embolism seen in the right upper lobe on prior has resolved. No interval pulmonary embolism. 3. Cardiomegaly and body wall edema. Electronically Signed   By: Tiburcio Pea M.D.   On: 01/11/2024 05:14   DG Chest Portable 1 View Result Date: 01/11/2024 CLINICAL DATA:  Shortness of breath.  Dialysis.  Recent PE. EXAM: PORTABLE CHEST 1 VIEW COMPARISON:  Radiographs and CT from 12/20/2023 FINDINGS: Stable cardiomegaly. Aortic atherosclerotic calcification. Small to moderate left greater than right pleural effusions and associated airspace opacities. Pulmonary vascular congestion. No pneumothorax. No displaced rib fractures. IMPRESSION: Small moderate left greater than right pleural effusions similar to 12/20/2023. Basilar airspace opacities may be due to atelectasis or pneumonia. Electronically Signed   By: Minerva Fester M.D.   On: 01/11/2024 02:39   CT Angio Chest PE W/Cm &/Or Wo Cm Result Date: 12/20/2023 CLINICAL DATA:  Pulmonary embolism (PE) suspected, high prob Shortness of breath.  Recent hospital admission for flu. EXAM: CT ANGIOGRAPHY CHEST WITH CONTRAST TECHNIQUE: Multidetector CT imaging of the chest was performed using the standard protocol during bolus administration of intravenous contrast. Multiplanar CT image  reconstructions and MIPs were obtained to evaluate the vascular anatomy. RADIATION DOSE REDUCTION: This exam was performed according to the departmental dose-optimization program which includes automated exposure control, adjustment of the mA and/or kV according to patient size and/or use of iterative reconstruction technique. CONTRAST:  75mL OMNIPAQUE IOHEXOL 350 MG/ML SOLN COMPARISON:  Radiograph earlier today. Chest CT 12/13/2023, CT 03/23/2023 FINDINGS: Cardiovascular: Isolated subsegmental pulmonary embolus in the right upper lobe, series 7, image 122. Thromboembolic burden is small. Size of thrombus does not indicate measuring RV to LV ratio. No other pulmonary arterial filling defects. The heart is mildly enlarged. There are coronary artery calcifications. Atherosclerosis of the thoracic aorta. No pericardial effusion. Mediastinum/Nodes: No  mediastinal or hilar adenopathy. Patulous upper esophagus. Lungs/Pleura: Small bilateral pleural effusions are similar to prior exam. Unchanged area of masslike consolidation in the right lower lobe, series 6, image 110, indeterminate for infection or chronic round atelectasis. Nodular and consolidative opacities in the left lower lobe have improved. Lingular subpleural nodule measuring 2.2 x 1.6 cm, series 6, image 81 has minimally decreased in size, previously 2.5 x 1.7 cm. No new airspace disease. No features of pulmonary edema. Upper Abdomen: Vascular calcifications.  No acute findings. Musculoskeletal: Exaggerated thoracic kyphosis with thoracic spondylosis. No acute osseous findings. Generalized paucity of subcutaneous fat. Review of the MIP images confirms the above findings. IMPRESSION: 1. Isolated subsegmental right upper lobe pulmonary embolus. Small thromboembolic burden. 2. Small bilateral pleural effusions are unchanged over the last week. Stable area of masslike consolidation in the right lower lobe which may represent chronic round atelectasis or pneumonia.  There is overall improvement in the consolidation in the left lower lobe from prior. 3. Subpleural nodule in the lingula has slightly decreased over the last week. Recommend CT follow-up in 1 month. Critical Value/emergent results were called by telephone at the time of interpretation on 12/20/2023 at 6:46 pm to provider [name], who verbally acknowledged these results. Aortic Atherosclerosis (ICD10-I70.0). Electronically Signed   By: Narda Rutherford M.D.   On: 12/20/2023 18:46   DG Chest Port 1 View Result Date: 12/20/2023 CLINICAL DATA:  Shortness of breath. EXAM: PORTABLE CHEST 1 VIEW COMPARISON:  December 13, 2023. FINDINGS: Stable cardiomediastinal silhouette. Minimal bibasilar subsegmental atelectasis or scarring is noted with small pleural effusions. Bony thorax is unremarkable. IMPRESSION: Minimal bibasilar subsegmental atelectasis or scarring is noted with small pleural effusions. Electronically Signed   By: Lupita Raider M.D.   On: 12/20/2023 13:12      Subjective:  No significant events overnight Discharge Exam: Vitals:   01/18/24 0800 01/18/24 1243  BP: (!) 90/57 (!) 116/53  Pulse: 69 63  Resp: 11 11  Temp: 97.8 F (36.6 C) 98 F (36.7 C)  SpO2: 90% 100%   Vitals:   01/18/24 0340 01/18/24 0500 01/18/24 0800 01/18/24 1243  BP: (!) 95/52  (!) 90/57 (!) 116/53  Pulse: 70  69 63  Resp: 16  11 11   Temp: 97.6 F (36.4 C)  97.8 F (36.6 C) 98 F (36.7 C)  TempSrc: Oral  Oral Oral  SpO2: 100%  90% 100%  Weight:  54 kg    Height:        General: Pt is alert, awake, not in acute distress, frail Cardiovascular: RRR, S1/S2 +, no rubs, no gallops Respiratory: CTA bilaterally, no wheezing, no rhonchi Abdominal: Soft, NT, ND, bowel sounds + Extremities: no edema, no cyanosis, bilateral lower extremity on floating boots    The results of significant diagnostics from this hospitalization (including imaging, microbiology, ancillary and laboratory) are listed below for reference.      Microbiology: Recent Results (from the past 240 hours)  MRSA Next Gen by PCR, Nasal     Status: None   Collection Time: 01/11/24  3:19 PM   Specimen: Nasal Mucosa; Nasal Swab  Result Value Ref Range Status   MRSA by PCR Next Gen NOT DETECTED NOT DETECTED Final    Comment: (NOTE) The GeneXpert MRSA Assay (FDA approved for NASAL specimens only), is one component of a comprehensive MRSA colonization surveillance program. It is not intended to diagnose MRSA infection nor to guide or monitor treatment for MRSA infections. Test performance is not FDA  approved in patients less than 66 years old. Performed at [facility], [address] [place], [place] [address]      Labs: BNP (last 3 results) Recent Labs    01/15/24 1022 01/16/24 0526 01/17/24 0612  BNP 718.4* 946.1* 1,285.8*   Basic Metabolic Panel: Recent Labs  Lab 01/13/24 0503 01/14/24 0506 01/15/24 1007 01/16/24 0526 01/17/24 0612  NA 127* 129* 131* 133* 134*  K 3.5 3.4* 3.7 3.4* 3.6  CL 94* 96* 95* 95* 98  CO2 22 26 26 29 28   GLUCOSE 108* 107* 157* 93 111*  BUN 23 13 8 16  25*  CREATININE 2.54* 1.80* 1.49* 1.98* 3.15*  CALCIUM 7.9* 7.5* 7.6* 7.2* 7.4*  MG 2.0 1.9 1.9 2.2 2.3  PHOS 1.5* 1.2* 2.1* 3.3 4.9*   Liver Function Tests: No results for input(s): "AST", "ALT", "ALKPHOS", "BILITOT", "PROT", "ALBUMIN" in the last 168 hours. No results for input(s): "LIPASE", "AMYLASE" in the last 168 hours. No results for input(s): "AMMONIA" in the last 168 hours. CBC: Recent Labs  Lab 01/13/24 0503 01/14/24 0506 01/15/24 1007 01/16/24 0526 01/17/24 0612  WBC 7.2 5.1 4.7 6.8 7.9  NEUTROABS  --  3.9 4.1 5.4 6.5  HGB 9.9* 8.8* 10.1* 9.6* 9.4*  HCT 31.4* 26.8* 32.0* 30.3* 29.8*  MCV 88.2 87.3 88.2 88.6 90.6  PLT 65* 72* 77* 91* 98*   Cardiac Enzymes: No results for input(s): "CKTOTAL", "CKMB", "CKMBINDEX", "TROPONINI" in the last 168 hours. BNP: Invalid input(s): "POCBNP" CBG: Recent Labs  Lab  01/14/24 1301 01/14/24 1942 01/15/24 0725 01/15/24 1256 01/15/24 1625  GLUCAP 85 145* 78 169* 129*   D-Dimer No results for input(s): "DDIMER" in the last 72 hours. Hgb A1c No results for input(s): "HGBA1C" in the last 72 hours. Lipid Profile No results for input(s): "CHOL", "HDL", "LDLCALC", "TRIG", "CHOLHDL", "LDLDIRECT" in the last 72 hours. Thyroid function studies No results for input(s): "TSH", "T4TOTAL", "T3FREE", "THYROIDAB" in the last 72 hours.  Invalid input(s): "FREET3" Anemia work up No results for input(s): "VITAMINB12", "FOLATE", "FERRITIN", "TIBC", "IRON", "RETICCTPCT" in the last 72 hours. Urinalysis    Component Value Date/Time   COLORURINE AMBER (A) 12/13/2023 0735   APPEARANCEUR CLOUDY (A) 12/13/2023 0735   LABSPEC 1.024 12/13/2023 0735   PHURINE 7.0 12/13/2023 0735   GLUCOSEU NEGATIVE 12/13/2023 0735   HGBUR SMALL (A) 12/13/2023 0735   BILIRUBINUR NEGATIVE 12/13/2023 0735   BILIRUBINUR Negative 12/05/2022 1454   KETONESUR NEGATIVE 12/13/2023 0735   PROTEINUR 100 (A) 12/13/2023 0735   UROBILINOGEN 0.2 12/05/2022 1454   UROBILINOGEN 0.2 11/19/2019 2004   NITRITE NEGATIVE 12/13/2023 0735   LEUKOCYTESUR MODERATE (A) 12/13/2023 0735   Sepsis Labs Recent Labs  Lab 01/14/24 0506 01/15/24 1007 01/16/24 0526 01/17/24 0612  WBC 5.1 4.7 6.8 7.9   Microbiology Recent Results (from the past 240 hours)  MRSA Next Gen by PCR, Nasal     Status: None   Collection Time: 01/11/24  3:19 PM   Specimen: Nasal Mucosa; Nasal Swab  Result Value Ref Range Status   MRSA by PCR Next Gen NOT DETECTED NOT DETECTED Final    Comment: (NOTE) The GeneXpert MRSA Assay (FDA approved for NASAL specimens only), is one component of a comprehensive MRSA colonization surveillance program. It is not intended to diagnose MRSA infection nor to guide or monitor treatment for MRSA infections. Test performance is not FDA approved in patients less than 26 years old. Performed at  [facility], [address],  [place], [place] [address]      Time coordinating discharge: Over 30 minutes  SIGNED:   Huey Bienenstock, MD  [facility] 01/18/2024, 2:33 PM Pager   If 7PM-7AM, please contact night-coverage [email or web address]

## 2024-01-18 NOTE — Discharge Instructions (Signed)
 Follow with Primary MD [name], [name], NP in 7 days   Get CBC, CMP,  checked  by Primary MD next visit.    Activity: As tolerated with Full fall precautions use walker/cane & assistance as needed   Disposition Home   Diet: Regular diet  On your next visit with your primary care physician please Get Medicines reviewed and adjusted.   Please request your Prim.MD to go over all Hospital Tests and Procedure/Radiological results at the follow up, please get all Hospital records sent to your Prim MD by signing hospital release before you go home.   If you experience worsening of your admission symptoms, develop shortness of breath, life threatening emergency, suicidal or homicidal thoughts you must seek medical attention immediately by calling 911 or calling your MD immediately  if symptoms less severe.  You Must read complete instructions/literature along with all the possible adverse reactions/side effects for all the Medicines you take and that have been prescribed to you. Take any new Medicines after you have completely understood and accpet all the possible adverse reactions/side effects.   Do not drive, operating heavy machinery, perform activities at heights, swimming or participation in water activities or provide baby sitting services if your were admitted for syncope or siezures until you have seen by Primary MD or a Neurologist and advised to do so again.  Do not drive when taking Pain medications.    Do not take more than prescribed Pain, Sleep and Anxiety Medications  Special Instructions: If you have smoked or chewed Tobacco  in the last 2 yrs please stop smoking, stop any regular Alcohol  and or any Recreational drug use.  Wear Seat belts while driving.   Please note  You were cared for by a hospitalist during your hospital stay. If you have any questions about your discharge medications or the care you received while you were in the hospital after you are discharged,  you can call the unit and asked to speak with the hospitalist on call if the hospitalist that took care of you is not available. Once you are discharged, your primary care physician will handle any further medical issues. Please note that NO REFILLS for any discharge medications will be authorized once you are discharged, as it is imperative that you return to your primary care physician (or establish a relationship with a primary care physician if you do not have one) for your aftercare needs so that they can reassess your need for medications and monitor your lab values.

## 2024-01-18 NOTE — TOC Transition Note (Signed)
 Transition of Care [facility]) - Discharge Note   Patient Details  Name: Kara Mullins MRN: [id number] Date of Birth: Oct 27, 1950  Transition of Care [facility]) CM/SW Contact:  Lawerance Sabal, RN Phone Number: 01/18/2024, 12:26 PM   Clinical Narrative:     Notified [facility] liaison that patient will DC today     Barriers to Discharge: Continued Medical Work up   Patient Goals and CMS Choice Patient states their goals for this hospitalization and ongoing recovery are:: to return home          Discharge Placement                       Discharge Plan and Services Additional resources added to the After Visit Summary for                                       Social Drivers of Health (SDOH) Interventions SDOH Screenings   Food Insecurity: No Food Insecurity (01/11/2024)  Housing: Low Risk  (01/11/2024)  Transportation Needs: No Transportation Needs (01/11/2024)  Utilities: Not At Risk (01/11/2024)  Alcohol Screen: Low Risk  (06/11/2022)  Depression (PHQ2-9): Low Risk  (01/27/2023)  Financial Resource Strain: Low Risk  (06/11/2022)  Social Connections: Moderately Integrated (01/14/2024)  Recent Concern: Social Connections - Moderately Isolated (01/11/2024)  Tobacco Use: Low Risk  (01/11/2024)     Readmission Risk Interventions    12/15/2023    3:44 PM 03/24/2023    2:26 PM 02/26/2023    2:53 PM  Readmission Risk Prevention Plan  Transportation Screening Complete Complete Complete  PCP or Specialist Appt within 3-5 Days Complete Complete   HRI or Home Care Consult Complete Complete Complete  Social Work Consult for Recovery Care Planning/Counseling  -- Complete  Palliative Care Screening Not Applicable Not Applicable Not Applicable  Medication Review Oceanographer) Complete Complete Referral to Pharmacy

## 2024-01-18 NOTE — Plan of Care (Signed)
  Problem: Clinical Measurements: Goal: Ability to maintain clinical measurements within normal limits will improve Outcome: Progressing Goal: Will remain free from infection Outcome: Progressing Goal: Respiratory complications will improve Outcome: Progressing   Problem: Activity: Goal: Risk for activity intolerance will decrease Outcome: Progressing   Problem: Skin Integrity: Goal: Risk for impaired skin integrity will decrease Outcome: Progressing

## 2024-01-18 NOTE — Progress Notes (Signed)
 [facility] Progress Note  Subjective:  Seen in room No c/o's today  Vitals:   01/17/24 2320 01/18/24 0340 01/18/24 0500 01/18/24 0800  BP: (!) 91/57 (!) 95/52  (!) 90/57  Pulse: 77 70  69  Resp: 15 16  11   Temp: 97.7 F (36.5 C) 97.6 F (36.4 C)  97.8 F (36.6 C)  TempSrc: Oral Oral  Oral  SpO2: 92% 100%  90%  Weight:   54 kg   Height:        Exam: General: Frail elderly female, on RA Lungs: Clear bilaterally to auscultation  Heart: S1, S2 2/6 systolic M. No R/G Abdomen: NABS, NT Lower extremities: no LE edema Neuro: Alert and oriented X 2 Dialysis Access: L AVF + T/B   [facility] HD: [facility]  3h  B350   45.5kg  3K bath  AVF  LUE   Heparin 4000 - Mircera 30 mcg IV Q 4 weeks (last dose 12/30/2023)   CXR 3/30 - IMPRESSION: Small moderate left greater than right pleural effusions similar to 12/20/2023. Basilar airspace opacities may be due to atelectasis or pneumonia.   Assessment/Plan: Aspiration PNA - bilat LL infiltrates per CT. S/p course of IV abx.  Anorexia/ severe malnutrition/ hypoglycemia - not eating, A1c low at 4.1, 70 lb wt loss over last year. Started on remeron and prednisone.  Recent PE: CTA here showed resolution. Getting low dose eliquis for this 2.5 bid per pmd, not full dose due to tendency towards low plts.  ESRD - TTS HD. Next HD 4/08.  BP - bp's soft after 2.7 L UF.  Volume  - no excess volume by exam. CXR's w/o edema. Not eating. Weights are off. BP's dropped. May need to keep even next HD.  Anemia  - HGB 10.0. Follow HGB.  Metabolic bone disease - CCa ok, phos low, holding binders.  Hypophosphatemia - improved after IV sod phos x 2 Nutrition - Very low albumin. Protein supplements Dispo - stable from renal standpoint, per pmd  Vinson Moselle  MD  [facility] 01/18/2024, 10:33 AM  Recent Labs  Lab 01/16/24 0526 01/17/24 0612  HGB 9.6* 9.4*  CALCIUM 7.2* 7.4*  PHOS 3.3 4.9*  CREATININE 1.98* 3.15*  K 3.4* 3.6    Inpatient  medications:  carvedilol  3.125 mg Oral BID   Chlorhexidine Gluconate Cloth  6 each Topical Q0600   feeding supplement  237 mL Oral BID BM   heparin injection (subcutaneous)  5,000 Units Subcutaneous Q8H   isosorbide mononitrate  15 mg Oral Daily   leptospermum manuka honey  1 Application Topical Daily   midodrine  10 mg Oral Once   mirtazapine  7.5 mg Oral Q24H   multivitamin  1 tablet Oral Q M,W,F,Sa-1800   pneumococcal 20-valent conjugate vaccine  0.5 mL Intramuscular Tomorrow-1000   predniSONE  20 mg Oral Q breakfast     acetaminophen **OR** acetaminophen, heparin sodium (porcine), heparin sodium (porcine), hydrALAZINE, midodrine

## 2024-01-19 ENCOUNTER — Telehealth: Payer: Self-pay

## 2024-01-19 ENCOUNTER — Ambulatory Visit: Payer: Medicare (Managed Care) | Admitting: Physical Therapy

## 2024-01-19 DIAGNOSIS — R634 Abnormal weight loss: Secondary | ICD-10-CM | POA: Diagnosis not present

## 2024-01-19 DIAGNOSIS — J111 Influenza due to unidentified influenza virus with other respiratory manifestations: Secondary | ICD-10-CM | POA: Diagnosis not present

## 2024-01-19 DIAGNOSIS — Z8616 Personal history of COVID-19: Secondary | ICD-10-CM | POA: Diagnosis not present

## 2024-01-19 DIAGNOSIS — E02 Subclinical iodine-deficiency hypothyroidism: Secondary | ICD-10-CM | POA: Diagnosis not present

## 2024-01-19 DIAGNOSIS — Z556 Problems related to health literacy: Secondary | ICD-10-CM | POA: Diagnosis not present

## 2024-01-19 DIAGNOSIS — Z7901 Long term (current) use of anticoagulants: Secondary | ICD-10-CM | POA: Diagnosis not present

## 2024-01-19 DIAGNOSIS — F411 Generalized anxiety disorder: Secondary | ICD-10-CM | POA: Diagnosis not present

## 2024-01-19 DIAGNOSIS — Z681 Body mass index (BMI) 19 or less, adult: Secondary | ICD-10-CM | POA: Diagnosis not present

## 2024-01-19 DIAGNOSIS — I890 Lymphedema, not elsewhere classified: Secondary | ICD-10-CM | POA: Diagnosis not present

## 2024-01-19 DIAGNOSIS — N390 Urinary tract infection, site not specified: Secondary | ICD-10-CM | POA: Diagnosis not present

## 2024-01-19 DIAGNOSIS — R0600 Dyspnea, unspecified: Secondary | ICD-10-CM | POA: Insufficient documentation

## 2024-01-19 DIAGNOSIS — E785 Hyperlipidemia, unspecified: Secondary | ICD-10-CM | POA: Diagnosis not present

## 2024-01-19 DIAGNOSIS — I132 Hypertensive heart and chronic kidney disease with heart failure and with stage 5 chronic kidney disease, or end stage renal disease: Secondary | ICD-10-CM | POA: Diagnosis not present

## 2024-01-19 DIAGNOSIS — D631 Anemia in chronic kidney disease: Secondary | ICD-10-CM | POA: Diagnosis not present

## 2024-01-19 NOTE — Progress Notes (Signed)
 Late Note Entry- January 19, 2024  Pt was d/c yesterday. Contacted [facility] this morning to be advised of pt's d/c date and that pt should resume care tomorrow.   Olivia Canter Renal Navigator [phone]

## 2024-01-19 NOTE — Transitions of Care (Post Inpatient/ED Visit) (Signed)
   01/19/2024  Name: Brizeyda Holtmeyer MRN: [id number] DOB: May 19, 1951  Today's TOC FU Call Status: Today's TOC FU Call Status:: Unsuccessful Call (1st Attempt) Unsuccessful Call (1st Attempt) Date: 01/19/24  Attempted to reach the patient regarding the most recent Inpatient/ED visit.  Follow Up Plan: Additional outreach attempts will be made to reach the patient to complete the Transitions of Care (Post Inpatient/ED visit) call.   Lonia Chimera, RN, BSN, CEN [employer]- Transition of Care Team.  [facility] [phone]

## 2024-01-19 NOTE — Discharge Planning (Signed)
 [facility] Patient Discharge Orders - [facility] CLINIC: [facility]  Patient's name: Kara Mullins Admit/DC Dates: 01/11/2024 - 01/18/2024  DISCHARGE DIAGNOSES: Dyspnea (known PE) - resolved on recent CT - remains on Eliquis 2.67m,g BID Aspiration pneumonia - LLL - s/p 5 days Unasyn during admit Hypotension - given IVF and started on steroid taper for possible adrenal insufficiency, now on midodrine 5mg  daily Hypoglycemia - not eating well, on steroids and remeron False alarm cardiac arrest - weak pulse in setting of hypoglycemia, not true cardiac arrest     HD ORDER CHANGES: Heparin change: no EDW Change: yes New EDW: 46.5kg    **bed weights very off here** Bath Change: no  ANEMIA MANAGEMENT: Aranesp: Given: no   ESA dose for discharge:  Mircera IV q 2 weeks - same schedule IV Iron dose at discharge: per protocol Transfusion: Given: no  BONE/MINERAL MEDICATIONS: Hectorol/Calcitriol change: no Sensipar/Parsabiv change: no  ACCESS INTERVENTION/CHANGE: no Details:   RECENT LABS: Recent Labs  Lab 01/17/24 0612  HGB 9.4*  NA 134*  K 3.6  CALCIUM 7.4*  PHOS 4.9*    IV ANTIBIOTICS: no Details:  OTHER ANTICOAGULATION: On Coumadin?: no Remains on Eliquis - dose lowered to 2.5mg  BID  OTHER/APPTS/LAB ORDERS:  - Binders held, furosemide and isosorbide stopped.  - Please consider GOC discussions/code status discussions  D/C Meds to be reconciled by nurse after every discharge.  Completed By: Ozzie Hoyle, [name] [facility] Pager [phone]   Reviewed by: MD:______ RN_______

## 2024-01-20 ENCOUNTER — Ambulatory Visit: Payer: Medicare (Managed Care) | Admitting: Vascular Surgery

## 2024-01-20 ENCOUNTER — Ambulatory Visit: Payer: Medicare (Managed Care)

## 2024-01-20 ENCOUNTER — Telehealth: Payer: Self-pay

## 2024-01-20 ENCOUNTER — Encounter: Payer: Self-pay | Admitting: Vascular Surgery

## 2024-01-20 VITALS — BP 104/67 | HR 60 | Temp 97.8°F

## 2024-01-20 DIAGNOSIS — D631 Anemia in chronic kidney disease: Secondary | ICD-10-CM | POA: Diagnosis not present

## 2024-01-20 DIAGNOSIS — N2581 Secondary hyperparathyroidism of renal origin: Secondary | ICD-10-CM | POA: Diagnosis not present

## 2024-01-20 DIAGNOSIS — I7025 Atherosclerosis of native arteries of other extremities with ulceration: Secondary | ICD-10-CM

## 2024-01-20 DIAGNOSIS — N181 Chronic kidney disease, stage 1: Secondary | ICD-10-CM | POA: Diagnosis not present

## 2024-01-20 DIAGNOSIS — N186 End stage renal disease: Secondary | ICD-10-CM | POA: Diagnosis not present

## 2024-01-20 DIAGNOSIS — Z992 Dependence on renal dialysis: Secondary | ICD-10-CM | POA: Diagnosis not present

## 2024-01-20 NOTE — Progress Notes (Signed)
 [facility] [place]  ASSESSMENT / PLAN: Kara Mullins is a 73 y.o. female with atherosclerosis of native arteries of left lower extremity causing ulceration of the left heel.  She is not a interventional candidate at this point.  Thankfully, her heel wound appears to be healing with local wound care.  Recommend:  Abstinence from all tobacco products. Blood glucose control with goal A1c < 7%. Blood pressure control with goal blood pressure < 140/90 mmHg. Lipid reduction therapy with goal LDL-C <100 mg/dL  Aspirin 81mg  PO QD.  Atorvastatin 40-80mg  PO QD (or other "high intensity" statin therapy).  Apply Betadine to heel wound daily.  Okay to keep it open to air for several hours a day.  Keep wrapped otherwise.  Keep heel offloaded with pressure boot.  I will see her again in 1 to 2 months to monitor progress of healing.  CHIEF COMPLAINT: Left heel ulcer  HISTORY OF PRESENT ILLNESS: Kara Mullins is a 73 y.o. female referred to clinic for evaluation of left posterior heel ulceration which is likely decubitus.  The patient suffered a prolonged hospitalization from which she was recently discharged.  She is quite weak and deconditioned.  Her daughter is caring for, and providing local care to the left heel decubitus ulcer.  This is improving slowly with good local care.  Patient is nonambulatory.  Past Medical History:  Diagnosis Date   Chronic combined systolic and diastolic congestive heart failure (HCC) 11/09/2021   Generalized anxiety disorder    Hyperlipidemia 11/09/2021   Hypertension    Stage 3b chronic kidney disease (CKD) (HCC) 11/09/2021    Past Surgical History:  Procedure Laterality Date   AV FISTULA PLACEMENT Left 03/27/2023   Procedure: LEFT ARM BRACHIOCEPHALIC ARTERIOVENOUS (AV) FISTULA CREATION;  Surgeon: [name], MD;  Location: [facility] OR;  Service: Vascular;  Laterality: Left;   CATARACT EXTRACTION, BILATERAL     combined  systolic and diastolic congestive heart failure      INSERTION OF DIALYSIS CATHETER N/A 03/27/2023   Procedure: INSERTION OF RIGHT INTERNAL JUGULAR TUNNELED DIALYSIS CATHETER;  Surgeon: [name], MD;  Location: [facility] OR;  Service: Vascular;  Laterality: N/A;   IR THORACENTESIS ASP PLEURAL SPACE W/IMG GUIDE  06/12/2023    Family History  Problem Relation Age of Onset   Throat cancer Father     Social History   Socioeconomic History   Marital status: Divorced    Spouse name: Not on file   Number of children: 1   Years of education: Not on file   Highest education level: Not on file  Occupational History   Occupation: retired  Tobacco Use   Smoking status: Never   Smokeless tobacco: Never  Vaping Use   Vaping status: Never Used  Substance and Sexual Activity   Alcohol use: Not Currently   Drug use: Never   Sexual activity: Not Currently    Partners: Male    Comment: Divorced  Other Topics Concern   Not on file  Social History Narrative   ** Merged History Encounter **       Tobacco use, amount per day now: N/A Past tobacco use, amount per day: N/A How many years did you use tobacco: N/A Alcohol use (drinks per week): N/A Diet: Do you drink/eat things with caffeine: Coffee Marital status: Divorced  What year were you married? 1992 Do you live in a house, apartment, assisted living, condo, trailer, etc.? Is it one or more stories? How many persons live in your home? 2 Do you have pets in your home?( please list) No Highest Level of e   ducation completed? High School Current or past profession: Restaurant Employee Do you exercise?   Yes                               Type and how often? Walk Do you have a living will? No Do you have a DNR form?    No                               If    not, do you want to discuss one? Do you have signed POA/HPOA forms? No                       If so, please bring to you appointment  Do you have  any difficulty bathing or dressing yourself? No Do you have any difficulty preparing food or eating? No    Do you have any difficulty managing your medications? No Do you have any difficulty managing your finances? No Do you have any difficulty affording your medications? No   Social Drivers of Corporate investment banker Strain: Low Risk  (06/11/2022)   Overall Financial Resource Strain (CARDIA)    Difficulty of Paying Living Expenses: Not very hard  Food Insecurity: No Food Insecurity (01/20/2024)   Hunger Vital Sign    Worried About Running Out of Food in the Last Year: Never true    Ran Out of Food in the Last Year: Never true  Transportation Needs: No Transportation Needs (01/20/2024)   PRAPARE - Administrator, Civil Service (Medical): No    Lack of Transportation (Non-Medical): No  Physical Activity: Not on file  Stress: Not on file  Social Connections: Moderately Integrated (01/14/2024)   Social Connection and Isolation Panel [NHANES]    Frequency of Communication with Friends and Family: Three times a week    Frequency of Social Gatherings with Friends and Family: Three times a week    Attends Religious Services: More than 4 times per year    Active Member of Clubs or Organizations: Yes    Attends Banker Meetings: 1 to 4 times per year    Marital Status: Divorced  Recent Concern: Social Connections - Moderately Isolated (01/11/2024)   Social Connection and Isolation Panel [NHANES]    Frequency of Communication with Friends and Family: More than three times a week    Frequency of Social Gatherings with Friends and Family: More than three times a week    Attends Religious Services: Never    Database administrator or Organizations: No    Attends Engineer, structural: 1 to 4 times per year    Marital Status: Divorced  Catering manager Violence: Not At Risk (01/20/2024)   Humiliation, Afraid, Rape, and Kick questionnaire    Fear of Current or  Ex-Partner: No    Emotionally Abused: No    Physically Abused: No    Sexually Abused: No    Allergies  Allergen Reactions   Crestor [Rosuvastatin] Shortness Of Breath   Nitrofuran Derivatives Nausea Only    Current Outpatient Medications  Medication Sig Dispense Refill   apixaban (ELIQUIS) 2.5 MG TABS tablet Take 1 tablet (2.5 mg total) by mouth 2 (two) times daily. 60 tablet 0   carvedilol (COREG) 3.125 MG tablet Please take 3.125 mg p.o. twice daily, but hold morning dose on dialysis days TTS AM.     fluticasone (FLONASE) 50 MCG/ACT nasal spray Place 2 sprays into both nostrils daily. 9.9 mL 2   lidocaine-prilocaine (EMLA) cream APPLY 1 APPLICATION TOPICALLY AS NEEDED.(USE 30 MINUTES PRIOR TO DIALYSIS). 30 g 5   Menthol-Methyl Salicylate (MUSCLE RUB) 10-15 % CREA Apply 1 Application topically daily as needed for muscle pain.     midodrine (PROAMATINE) 5 MG tablet Take as needed for systolic blood pressure less than 100, and take morning during dialysis days.     mirtazapine (REMERON) 7.5 MG tablet Take 1 tablet (7.5 mg total) by mouth daily. 30 tablet 0   multivitamin (RENA-VIT) TABS tablet Take 1 tablet by mouth daily. (Patient taking differently: Take 1 tablet by mouth See admin instructions. Take 1 tablet by mouth every Mon, Wed, Fri, Sat) 90 tablet 1   OVER THE COUNTER MEDICATION Take 1 fluid ounce by mouth daily. Liquid Cell Protein     pantoprazole (PROTONIX) 40 MG tablet Take 1 tablet (40 mg total) by mouth daily. 30 tablet 0   predniSONE (DELTASONE) 10 MG tablet Take two tablets by mouth daily with breakfast for 5 days, then take one tablet by mouth daily with breakfast for 5 days, then take one-half tablet daily with breakfast for 5 days, then stop. 20 tablet 0   silver sulfADIAZINE (SILVADENE) 1 % cream Apply 1 Application topically daily. (Patient not taking: Reported on 01/20/2024) 50 g 0   Bismuth Tribromoph-Petrolatum (XEROFORM PETROLAT PATCH 2"X2" EX) Apply 1 Application  topically daily. Cleanse L heel with Vashe wound cleanser do not rinse and allow to air dry. Apply Xeroform gauze to wound bed daily cover with dry gauze and Kerlix roll gauze. Place L foot in Prevalon boot to offload pressure     leptospermum manuka honey (MEDIHONEY) gel Apply 1 Application topically daily. Cleanse sacrum and buttocks (intact skin and open wounds) with Vashe wound do not rinse and allow to air dry.  Apply Medihoney to wound beds daily, cover with dry gauze.  May cover area with silicone foam or ABD pad and tape.     No current facility-administered medications for this visit.    PHYSICAL EXAM Vitals:   01/20/24 1254  BP: 104/67  Pulse: 60  Temp: 97.8 F (36.6 C)  SpO2: 99%   Elderly woman.  Sarcopenia.  Chronically ill. Left arm AV fistula with thrill Left lower extremity dependent edema No palpable pedal pulses Left posterior heel ulcer about the size of a quarter, appears contained to the subcutaneous tissue only.  PERTINENT LABORATORY AND RADIOLOGIC DATA  Most recent CBC    Latest Ref Rng & Units 01/17/2024    6:12 AM 01/16/2024    5:26 AM 01/15/2024   10:07 AM  CBC  WBC 4.0 - 10.5 K/uL 7.9  6.8  4.7   Hemoglobin 12.0 - 15.0 g/dL 9.4  9.6  16.1   Hematocrit 36.0 - 46.0 % 29.8  30.3  32.0   Platelets 150 - 400 K/uL 98  91  77      Most recent CMP    Latest Ref Rng & Units 01/17/2024    6:12 AM 01/16/2024    5:26 AM 01/15/2024   10:07 AM  CMP  Glucose 70 - 99 mg/dL 604  93  540   BUN 8 - 23 mg/dL 25  16  8    Creatinine 0.44 - 1.00 mg/dL 9.81  1.91  4.78   Sodium 135 - 145 mmol/L 134  133  131   Potassium 3.5 - 5.1 mmol/L 3.6  3.4  3.7   Chloride 98 - 111 mmol/L 98  95  95   CO2 22 - 32 mmol/L 28  29  26    Calcium 8.9 - 10.3 mg/dL 7.4  7.2  7.6     Renal function Estimated Creatinine Clearance: 14 mL/min (A) (by C-G formula based on SCr of 3.15 mg/dL (H)).  Hgb A1c MFr Bld (%)  Date Value  01/13/2024 4.1 (L)    LDL Cholesterol (Calc)  Date Value Ref  Range Status  08/27/2021 128 (H) mg/dL (calc) Final    Comment:    Reference range: <100 . Desirable range <100 mg/dL for primary prevention;   <70 mg/dL for patients with CHD or diabetic patients  with > or = 2 CHD risk factors. Marland Kitchen LDL-C is now calculated using the Martin-Hopkins  calculation, which is a validated novel method providing  better accuracy than the Friedewald equation in the  estimation of LDL-C.  Horald Pollen et al. Lenox Ahr. 2956;213(08): 2061-2068  (http://education.[employer].com/faq/FAQ164)    LDL Chol Calc (NIH)  Date Value Ref Range Status  04/21/2023 91 0 - 99 mg/dL Final   LDL Direct  Date Value Ref Range Status  04/21/2023 99 0 - 99 mg/dL Final     +-------+-----------+-----------+------------+------------+  ABI/TBIToday's ABIToday's TBIPrevious ABIPrevious TBI  +-------+-----------+-----------+------------+------------+  Right [place]         0.99                                 +-------+-----------+-----------+------------+------------+  Left  [place]                                            +-------+-----------+-----------+------------+------------+   Rande Brunt. Lenell Antu, MD [facility] Phone Number: [phone] 01/20/2024 4:33 PM   Total time spent on preparing this encounter including chart review, data review, collecting history, examining the patient, coordinating care for this established patient, 30 minutes.  Portions of this report may have been transcribed using voice recognition software.  Every effort has been made to ensure accuracy; however, inadvertent computerized transcription errors may still be present.

## 2024-01-20 NOTE — Transitions of Care (Post Inpatient/ED Visit) (Signed)
 01/20/2024  Name: Kara Mullins MRN: [id number] DOB: 20-Sep-1951  Today's TOC FU Call Status: Today's TOC FU Call Status:: Successful TOC FU Call Completed Unsuccessful Call (1st Attempt) Date: 01/19/24 [facility] FU Call Complete Date: 01/20/24 Patient's Name and Date of Birth confirmed.  Transition Care Management Follow-up Telephone Call Date of Discharge: 01/18/24 Discharge Facility[facility] Type of Discharge: Inpatient Admission Primary Inpatient Discharge Diagnosis:: Aspiration pneumonia of left lower lobe How have you been since you were released from the hospital?: Better Any questions or concerns?: No  Items Reviewed: Did you receive and understand the discharge instructions provided?: Yes Medications obtained,verified, and reconciled?: Yes (Medications Reviewed) Any new allergies since your discharge?: No Dietary orders reviewed?: Yes Type of Diet Ordered:: Reg Heart Healthy Do you have support at home?: Yes People in Home [RPT]: child(ren), adult Name of Support/Comfort Primary Source: Daughter Kara Mullins  Medications Reviewed Today: Medications Reviewed Today     Reviewed by Johnnette Barrios, RN (Registered Nurse) on 01/20/24 at 1411  Med List Status: <None>   Medication Order Taking? Sig Documenting Provider Last Dose Status Informant  apixaban (ELIQUIS) 2.5 MG TABS tablet [id number] Yes Take 1 tablet (2.5 mg total) by mouth 2 (two) times daily. Elgergawy, Leana Roe, MD Taking Active   carvedilol (COREG) 3.125 MG tablet [id number] Yes Please take 3.125 mg p.o. twice daily, but hold morning dose on dialysis days TTS AM. Elgergawy, Leana Roe, MD Taking Active   fluticasone (FLONASE) 50 MCG/ACT nasal spray [id number] Yes Place 2 sprays into both nostrils daily. [name], [name] Taking Active Family Member  lidocaine-prilocaine (EMLA) cream [id number] Yes APPLY 1 APPLICATION TOPICALLY AS NEEDED.(USE 30 MINUTES PRIOR TO DIALYSIS). [name], [name], NP Taking Active    Menthol-Methyl Salicylate (MUSCLE RUB) 10-15 % CREA [id number] Yes Apply 1 Application topically daily as needed for muscle pain. [provider] Taking Active Family Member  midodrine (PROAMATINE) 5 MG tablet [id number] Yes Take as needed for systolic blood pressure less than 100, and take morning during dialysis days. Elgergawy, Leana Roe, MD Taking Active   mirtazapine (REMERON) 7.5 MG tablet [id number] Yes Take 1 tablet (7.5 mg total) by mouth daily. Elgergawy, Leana Roe, MD Taking Active   multivitamin (RENA-VIT) TABS tablet [id number] Yes Take 1 tablet by mouth daily.  Patient taking differently: Take 1 tablet by mouth See admin instructions. Take 1 tablet by mouth every Mon, Wed, Fri, Sat   [name], [name], NP Taking Active Family Member  OVER THE COUNTER MEDICATION [id number] Yes Take 1 fluid ounce by mouth daily. Liquid Cell Protein [provider] Taking Active Family Member  pantoprazole (PROTONIX) 40 MG tablet [id number] Yes Take 1 tablet (40 mg total) by mouth daily. Elgergawy, Leana Roe, MD Taking Active   predniSONE (DELTASONE) 10 MG tablet [id number] Yes Take two tablets by mouth daily with breakfast for 5 days, then take one tablet by mouth daily with breakfast for 5 days, then take one-half tablet daily with breakfast for 5 days, then stop. Elgergawy, Leana Roe, MD Taking Active   silver sulfADIAZINE (SILVADENE) 1 % cream [id number] Yes Apply 1 Application topically daily. [name], [name] Taking Active Family Member          Medication reconciliation / review completed based on most recent discharge summary and EHR medication list. Confirmed patient is taking all newly prescribed medications as instructed (any discrepancies are noted in review section)   Patient / Caregiver is aware of any changes to and /  or  any dosage adjustments to medication regimen. Patient/ Caregiver denies questions at this time and reports no barriers to medication adherence.   The  following updates were reviewed with the patient and daughter  START taking: Eliquis (apixaban) mirtazapine (REMERON) pantoprazole (Protonix)  midodrine (PROAMATINE)  Take as needed for systolic blood pressure less than 100, and take morning during dialysis days.    predniSONE (DELTASONE)  Take two tablets by mouth daily with breakfast for 5 days, then take one tablet by mouth daily with breakfast for 5 days, then take one-half tablet daily with breakfast for 5 days, then stop.  CHANGE how you take: carvedilol (COREG) Please take 3.125 mg p.o. twice daily, but hold morning dose on dialysis days TTS AM. What changed: how much to take how to take this when to take this additional instructions  STOP taking: Apixaban Starter Pack (10mg  and 5mg ) (ELIQUIS STARTER PACK) Replaced by a similar medication.See Above  furosemide 80 MG tablet (LASIX) isosorbide mononitrate 30 MG 24 hr tablet (IMDUR) sevelamer carbonate 800 MG tablet (RENVELA)   For multiple Pressure Injuries  Wound care  Daily      Cleanse sacrum and buttocks (intact skin and open wounds) with Vashe wound do not rinse and allow to air dry.  Apply Medihoney to wound beds daily, cover with dry gauze.  May cover area with silicone foam or ABD pad and tape.   Wound care  Daily      Cleanse L heel with Vashe wound cleanser do not rinse and allow to air dry. Apply Xeroform gauze to wound bed daily cover with dry gauze and Kerlix roll gauze. Place L foot in Prevalon boot to offload pressure    Home Care and Equipment/Supplies: Were Home Health Services Ordered?: Yes Name of Home Health Agency:: [facility] [phone] Has Agency set up a time to come to your home?: Yes First Home Health Visit Date: 01/21/24 Any new equipment or medical supplies ordered?: No  Functional Questionnaire: Do you need assistance with bathing/showering or dressing?: No Do you need assistance with meal preparation?: Yes Do you need  assistance with eating?: No Do you have difficulty maintaining continence: No Do you need assistance with getting out of bed/getting out of a chair/moving?: Yes Do you have difficulty managing or taking your medications?:  (SBA)  Follow up appointments reviewed: PCP Follow-up appointment confirmed?: No (Daughter will call and schedule ( last seen 2 weeks ago) AWV 4/21) MD Provider Line Number:[phone] Given: No Specialist Hospital Follow-up appointment confirmed?: Yes Date of Specialist follow-up appointment?: 01/20/24 Follow-Up Specialty Provider:: Vein and Vascular Do you need transportation to your follow-up appointment?: No Do you understand care options if your condition(s) worsen?: Yes-patient verbalized understanding  SDOH Interventions Today    Flowsheet Row Most Recent Value  SDOH Interventions   Food Insecurity Interventions Intervention Not Indicated  Housing Interventions Intervention Not Indicated  Transportation Interventions Intervention Not Indicated, Patient Resources (Friends/Family), Payor Benefit  Utilities Interventions Intervention Not Indicated      Benefits reviewed  Based on current information and Insurance plan -Reviewed benefits accessible to patient, including details about eligibility options for care and  available value based care options  if any areas of needs were identified.  Reviewed patient/  caregiver's ability to access and / or  ability with navigating the benefits system..Amb Referral made if indicted , refer to orders section of note for details   Reviewed goals for care  Patient / Caregiver was encouraged to make informed decisions  about their care, actively participate in managing their health condition, and implement lifestyle changes as needed to promote independence and self-management of health care. There were no reported  barriers to care.   TOC program  Patient is at  risk for readmission and / or has history of  high utilization   Discussed VBCI  TOC program and weekly calls to patient to assess condition/status, medication management  and provide support/education as indicated . Patient  and / or Caregive voiced understanding and declined enrollment in the 30-day TOC Program at this time . She is followed by [facility] She is seen by Dialysis CM / Nurse   [facility] . She did not feel she needed additional follow-up at this time    Follow-up Plan 1. Please call scheduled a post hospital Follow up appointment  with your  PCP within in 1-2 weeks of your hospital discharge date -when scheduling please ask your Primary MD to get all Hospital records sent to his/her office. Daughter will call and schedule  Take all your medications and your discharge paperwork with you for your next visit with your Primary MD-  Please uptitrate mirtazapine Uptitrate midodrine if needed  Be sure to request your Primary MD to go over all hospital tests and procedure/radiological results at the follow up appointment ,  Please obtain BMP/CBC in one week       Repeat CT chest in 4 to 6 weeks to ensure resolution of subpleural nodule  2. Continue Visits with [facility]  3. Dialysis [facility] adjust medication schedule to accommodate dialysis times     Patient was encouraged to Contact PCP with any questions or concerns regarding ongoing medical care, any difficulty obtaining or picking up prescriptions, any changes or worsening in condition including signs / symptoms not relieved  with interventions Patient had no additional questions or concerns at this time.   The patient has been provided with contact information for the care management team and has been advised to call with any health-related questions or concerns. Follow up with their  PCP, Specialists or  additional Healthcare Providers as scheduled,  sooner if concerns arise    Susa Loffler , BSN, RN [facility]   VBCI-Population Health RN Care  Manager Direct Dial [phone]  Fax: [phone] Website: [email or web address]

## 2024-01-27 ENCOUNTER — Ambulatory Visit (HOSPITAL_COMMUNITY): Admission: RE | Admit: 2024-01-27 | Payer: Medicare (Managed Care) | Source: Ambulatory Visit | Attending: Cardiology

## 2024-01-27 DIAGNOSIS — N390 Urinary tract infection, site not specified: Secondary | ICD-10-CM | POA: Diagnosis not present

## 2024-01-27 DIAGNOSIS — I890 Lymphedema, not elsewhere classified: Secondary | ICD-10-CM | POA: Diagnosis not present

## 2024-01-27 DIAGNOSIS — R634 Abnormal weight loss: Secondary | ICD-10-CM | POA: Diagnosis not present

## 2024-01-27 DIAGNOSIS — Z8616 Personal history of COVID-19: Secondary | ICD-10-CM | POA: Diagnosis not present

## 2024-01-27 DIAGNOSIS — F411 Generalized anxiety disorder: Secondary | ICD-10-CM | POA: Diagnosis not present

## 2024-01-27 DIAGNOSIS — Z556 Problems related to health literacy: Secondary | ICD-10-CM | POA: Diagnosis not present

## 2024-01-27 DIAGNOSIS — I132 Hypertensive heart and chronic kidney disease with heart failure and with stage 5 chronic kidney disease, or end stage renal disease: Secondary | ICD-10-CM | POA: Diagnosis not present

## 2024-01-27 DIAGNOSIS — Z7901 Long term (current) use of anticoagulants: Secondary | ICD-10-CM | POA: Diagnosis not present

## 2024-01-27 DIAGNOSIS — E785 Hyperlipidemia, unspecified: Secondary | ICD-10-CM | POA: Diagnosis not present

## 2024-01-27 DIAGNOSIS — J111 Influenza due to unidentified influenza virus with other respiratory manifestations: Secondary | ICD-10-CM | POA: Diagnosis not present

## 2024-01-27 DIAGNOSIS — Z681 Body mass index (BMI) 19 or less, adult: Secondary | ICD-10-CM | POA: Diagnosis not present

## 2024-01-27 DIAGNOSIS — E02 Subclinical iodine-deficiency hypothyroidism: Secondary | ICD-10-CM | POA: Diagnosis not present

## 2024-01-27 DIAGNOSIS — D631 Anemia in chronic kidney disease: Secondary | ICD-10-CM | POA: Diagnosis not present

## 2024-02-02 ENCOUNTER — Encounter: Payer: Medicare (Managed Care) | Admitting: Family

## 2024-02-03 ENCOUNTER — Encounter: Payer: Medicare (Managed Care) | Admitting: Physical Therapy

## 2024-02-10 ENCOUNTER — Encounter: Payer: Medicare (Managed Care) | Admitting: Physical Therapy

## 2024-02-11 DIAGNOSIS — I129 Hypertensive chronic kidney disease with stage 1 through stage 4 chronic kidney disease, or unspecified chronic kidney disease: Secondary | ICD-10-CM | POA: Diagnosis not present

## 2024-02-11 DIAGNOSIS — Z992 Dependence on renal dialysis: Secondary | ICD-10-CM | POA: Diagnosis not present

## 2024-02-11 DIAGNOSIS — N186 End stage renal disease: Secondary | ICD-10-CM | POA: Diagnosis not present

## 2024-02-12 DIAGNOSIS — N2581 Secondary hyperparathyroidism of renal origin: Secondary | ICD-10-CM | POA: Diagnosis not present

## 2024-02-12 DIAGNOSIS — Z992 Dependence on renal dialysis: Secondary | ICD-10-CM | POA: Diagnosis not present

## 2024-02-12 DIAGNOSIS — N186 End stage renal disease: Secondary | ICD-10-CM | POA: Diagnosis not present

## 2024-02-12 DIAGNOSIS — D631 Anemia in chronic kidney disease: Secondary | ICD-10-CM | POA: Diagnosis not present

## 2024-02-17 ENCOUNTER — Encounter: Payer: Medicare (Managed Care) | Admitting: Physical Therapy

## 2024-02-23 ENCOUNTER — Encounter (HOSPITAL_COMMUNITY): Payer: Self-pay

## 2024-02-23 ENCOUNTER — Inpatient Hospital Stay (HOSPITAL_COMMUNITY)
Admission: EM | Admit: 2024-02-23 | Discharge: 2024-03-14 | DRG: 871 | Disposition: E | Payer: Medicare (Managed Care) | Attending: Internal Medicine | Admitting: Internal Medicine

## 2024-02-23 ENCOUNTER — Telehealth: Payer: Self-pay | Admitting: Family

## 2024-02-23 ENCOUNTER — Emergency Department (HOSPITAL_COMMUNITY): Payer: Medicare (Managed Care)

## 2024-02-23 ENCOUNTER — Other Ambulatory Visit: Payer: Self-pay

## 2024-02-23 DIAGNOSIS — I480 Paroxysmal atrial fibrillation: Secondary | ICD-10-CM | POA: Diagnosis present

## 2024-02-23 DIAGNOSIS — D61818 Other pancytopenia: Secondary | ICD-10-CM | POA: Diagnosis present

## 2024-02-23 DIAGNOSIS — Z992 Dependence on renal dialysis: Secondary | ICD-10-CM

## 2024-02-23 DIAGNOSIS — T68XXXA Hypothermia, initial encounter: Secondary | ICD-10-CM | POA: Diagnosis not present

## 2024-02-23 DIAGNOSIS — E2740 Unspecified adrenocortical insufficiency: Secondary | ICD-10-CM | POA: Diagnosis not present

## 2024-02-23 DIAGNOSIS — J9601 Acute respiratory failure with hypoxia: Secondary | ICD-10-CM | POA: Diagnosis not present

## 2024-02-23 DIAGNOSIS — L89152 Pressure ulcer of sacral region, stage 2: Secondary | ICD-10-CM | POA: Diagnosis not present

## 2024-02-23 DIAGNOSIS — Z515 Encounter for palliative care: Secondary | ICD-10-CM | POA: Diagnosis not present

## 2024-02-23 DIAGNOSIS — M869 Osteomyelitis, unspecified: Secondary | ICD-10-CM | POA: Diagnosis not present

## 2024-02-23 DIAGNOSIS — I739 Peripheral vascular disease, unspecified: Secondary | ICD-10-CM | POA: Diagnosis present

## 2024-02-23 DIAGNOSIS — D631 Anemia in chronic kidney disease: Secondary | ICD-10-CM | POA: Diagnosis present

## 2024-02-23 DIAGNOSIS — N186 End stage renal disease: Secondary | ICD-10-CM

## 2024-02-23 DIAGNOSIS — E785 Hyperlipidemia, unspecified: Secondary | ICD-10-CM | POA: Diagnosis present

## 2024-02-23 DIAGNOSIS — J690 Pneumonitis due to inhalation of food and vomit: Secondary | ICD-10-CM | POA: Diagnosis present

## 2024-02-23 DIAGNOSIS — N2581 Secondary hyperparathyroidism of renal origin: Secondary | ICD-10-CM | POA: Diagnosis present

## 2024-02-23 DIAGNOSIS — E43 Unspecified severe protein-calorie malnutrition: Secondary | ICD-10-CM | POA: Diagnosis present

## 2024-02-23 DIAGNOSIS — R001 Bradycardia, unspecified: Secondary | ICD-10-CM | POA: Insufficient documentation

## 2024-02-23 DIAGNOSIS — L97429 Non-pressure chronic ulcer of left heel and midfoot with unspecified severity: Secondary | ICD-10-CM

## 2024-02-23 DIAGNOSIS — G9341 Metabolic encephalopathy: Secondary | ICD-10-CM | POA: Diagnosis present

## 2024-02-23 DIAGNOSIS — I5042 Chronic combined systolic (congestive) and diastolic (congestive) heart failure: Secondary | ICD-10-CM | POA: Diagnosis present

## 2024-02-23 DIAGNOSIS — L97409 Non-pressure chronic ulcer of unspecified heel and midfoot with unspecified severity: Secondary | ICD-10-CM | POA: Diagnosis present

## 2024-02-23 DIAGNOSIS — A419 Sepsis, unspecified organism: Secondary | ICD-10-CM | POA: Diagnosis not present

## 2024-02-23 DIAGNOSIS — F411 Generalized anxiety disorder: Secondary | ICD-10-CM | POA: Diagnosis present

## 2024-02-23 DIAGNOSIS — I469 Cardiac arrest, cause unspecified: Secondary | ICD-10-CM | POA: Diagnosis not present

## 2024-02-23 DIAGNOSIS — L89623 Pressure ulcer of left heel, stage 3: Secondary | ICD-10-CM | POA: Diagnosis present

## 2024-02-23 DIAGNOSIS — E871 Hypo-osmolality and hyponatremia: Secondary | ICD-10-CM | POA: Diagnosis not present

## 2024-02-23 DIAGNOSIS — R6520 Severe sepsis without septic shock: Secondary | ICD-10-CM | POA: Diagnosis not present

## 2024-02-23 DIAGNOSIS — F419 Anxiety disorder, unspecified: Secondary | ICD-10-CM | POA: Diagnosis present

## 2024-02-23 DIAGNOSIS — Z7189 Other specified counseling: Secondary | ICD-10-CM | POA: Diagnosis not present

## 2024-02-23 DIAGNOSIS — I132 Hypertensive heart and chronic kidney disease with heart failure and with stage 5 chronic kidney disease, or end stage renal disease: Secondary | ICD-10-CM | POA: Diagnosis present

## 2024-02-23 DIAGNOSIS — Z7401 Bed confinement status: Secondary | ICD-10-CM

## 2024-02-23 DIAGNOSIS — R6521 Severe sepsis with septic shock: Secondary | ICD-10-CM | POA: Diagnosis present

## 2024-02-23 DIAGNOSIS — M86172 Other acute osteomyelitis, left ankle and foot: Secondary | ICD-10-CM | POA: Diagnosis not present

## 2024-02-23 DIAGNOSIS — Z66 Do not resuscitate: Secondary | ICD-10-CM | POA: Diagnosis present

## 2024-02-23 DIAGNOSIS — E162 Hypoglycemia, unspecified: Secondary | ICD-10-CM | POA: Diagnosis present

## 2024-02-23 DIAGNOSIS — R627 Adult failure to thrive: Secondary | ICD-10-CM | POA: Diagnosis not present

## 2024-02-23 DIAGNOSIS — Z7901 Long term (current) use of anticoagulants: Secondary | ICD-10-CM

## 2024-02-23 DIAGNOSIS — Z1152 Encounter for screening for COVID-19: Secondary | ICD-10-CM | POA: Diagnosis not present

## 2024-02-23 DIAGNOSIS — R1310 Dysphagia, unspecified: Secondary | ICD-10-CM | POA: Diagnosis not present

## 2024-02-23 DIAGNOSIS — I5032 Chronic diastolic (congestive) heart failure: Secondary | ICD-10-CM | POA: Diagnosis not present

## 2024-02-23 DIAGNOSIS — Z993 Dependence on wheelchair: Secondary | ICD-10-CM

## 2024-02-23 DIAGNOSIS — Z79899 Other long term (current) drug therapy: Secondary | ICD-10-CM

## 2024-02-23 DIAGNOSIS — Z86711 Personal history of pulmonary embolism: Secondary | ICD-10-CM

## 2024-02-23 DIAGNOSIS — K219 Gastro-esophageal reflux disease without esophagitis: Secondary | ICD-10-CM | POA: Diagnosis present

## 2024-02-23 DIAGNOSIS — R680 Hypothermia, not associated with low environmental temperature: Secondary | ICD-10-CM | POA: Diagnosis not present

## 2024-02-23 DIAGNOSIS — Z808 Family history of malignant neoplasm of other organs or systems: Secondary | ICD-10-CM

## 2024-02-23 DIAGNOSIS — I10 Essential (primary) hypertension: Secondary | ICD-10-CM | POA: Diagnosis present

## 2024-02-23 HISTORY — DX: Other pulmonary embolism without acute cor pulmonale: I26.99

## 2024-02-23 HISTORY — DX: End stage renal disease: N18.6

## 2024-02-23 HISTORY — DX: Dependence on renal dialysis: Z99.2

## 2024-02-23 LAB — DIC (DISSEMINATED INTRAVASCULAR COAGULATION)PANEL
D-Dimer, Quant: 0.84 ug{FEU}/mL — ABNORMAL HIGH (ref 0.00–0.50)
Fibrinogen: 436 mg/dL (ref 210–475)
INR: 1.2 (ref 0.8–1.2)
Platelets: 36 10*3/uL — ABNORMAL LOW (ref 150–400)
Prothrombin Time: 15 s (ref 11.4–15.2)
Smear Review: NONE SEEN
aPTT: 43 s — ABNORMAL HIGH (ref 24–36)

## 2024-02-23 LAB — T4, FREE: Free T4: 1.14 ng/dL — ABNORMAL HIGH (ref 0.61–1.12)

## 2024-02-23 LAB — BASIC METABOLIC PANEL WITH GFR
Anion gap: 6 (ref 5–15)
BUN: 15 mg/dL (ref 8–23)
CO2: 31 mmol/L (ref 22–32)
Calcium: 8.4 mg/dL — ABNORMAL LOW (ref 8.9–10.3)
Chloride: 99 mmol/L (ref 98–111)
Creatinine, Ser: 2.14 mg/dL — ABNORMAL HIGH (ref 0.44–1.00)
GFR, Estimated: 24 mL/min — ABNORMAL LOW (ref >60)
Glucose, Bld: 54 mg/dL — ABNORMAL LOW (ref 70–99)
Potassium: 3.5 mmol/L (ref 3.5–5.1)
Sodium: 136 mmol/L (ref 135–145)

## 2024-02-23 LAB — CBC
HCT: 27.2 % — ABNORMAL LOW (ref 36.0–46.0)
Hemoglobin: 8.1 g/dL — ABNORMAL LOW (ref 12.0–15.0)
MCH: 28.5 pg (ref 26.0–34.0)
MCHC: 29.8 g/dL — ABNORMAL LOW (ref 30.0–36.0)
MCV: 95.8 fL (ref 80.0–100.0)
Platelets: 37 10*3/uL — ABNORMAL LOW (ref 150–400)
RBC: 2.84 MIL/uL — ABNORMAL LOW (ref 3.87–5.11)
RDW: 18.2 % — ABNORMAL HIGH (ref 11.5–15.5)
WBC: 2.5 10*3/uL — ABNORMAL LOW (ref 4.0–10.5)
nRBC: 2.9 % — ABNORMAL HIGH (ref 0.0–0.2)

## 2024-02-23 LAB — RESP PANEL BY RT-PCR (RSV, FLU A&B, COVID)  RVPGX2
Influenza A by PCR: NEGATIVE
Influenza B by PCR: NEGATIVE
Resp Syncytial Virus by PCR: NEGATIVE
SARS Coronavirus 2 by RT PCR: NEGATIVE

## 2024-02-23 LAB — TSH: TSH: 2.979 u[IU]/mL (ref 0.350–4.500)

## 2024-02-23 LAB — I-STAT CG4 LACTIC ACID, ED: Lactic Acid, Venous: 0.4 mmol/L — ABNORMAL LOW (ref 0.5–1.9)

## 2024-02-23 MED ORDER — SODIUM CHLORIDE 0.9 % IV BOLUS
500.0000 mL | Freq: Once | INTRAVENOUS | Status: AC
  Administered 2024-02-23: 500 mL via INTRAVENOUS

## 2024-02-23 MED ORDER — CEFEPIME HCL 2 G IV SOLR
2.0000 g | Freq: Once | INTRAVENOUS | Status: AC
  Administered 2024-02-23: 2 g via INTRAVENOUS
  Filled 2024-02-23: qty 12.5

## 2024-02-23 MED ORDER — VANCOMYCIN HCL IN DEXTROSE 1-5 GM/200ML-% IV SOLN
1000.0000 mg | Freq: Once | INTRAVENOUS | Status: AC
  Administered 2024-02-23: 1000 mg via INTRAVENOUS
  Filled 2024-02-23: qty 200

## 2024-02-23 NOTE — Telephone Encounter (Signed)
 Pt's daughter is asking for recertification of home health. [facility] made an error and canceled it, stating the patient had to go back to work; however, the patient doesn't work.     Please advise.

## 2024-02-23 NOTE — ED Notes (Signed)
 Provider bedside.

## 2024-02-23 NOTE — ED Triage Notes (Signed)
 Pt came in via POV d/t her family reporting that her vitals have been running low. Her daughter reports she has been septic before & was concerned about her pulse being 46 bpm at home.

## 2024-02-23 NOTE — H&P (Signed)
 History and Physical    Kara Mullins JYN:[id number] DOB: Mar 12, 1951 DOA: 02/23/2024  Patient coming from: Home.  Chief Complaint: Bradycardia and not feeling well.  HPI: Kara Mullins is a 73 y.o. female with history of ESRD on hemodialysis on Tuesday Thursday Saturdays, recently diagnosed PE, chronic pancytopenia, chronic diastolic dysfunction, hypertension recently admitted to the hospital twice first time for pneumonia from influenza then following which patient also was admitted for pneumonia from aspiration discharged on 01/18/2024 was brought to the ER after patient's daughter felt that patient was not doing well and was found to be bradycardic.  Patient did not have any productive cough nausea vomiting or diarrhea.  Patient had recently followed up with vascular surgery for chronic nonhealing wound of the left heel and as per vascular surgery patient is a poor candidate for any intervention.  ED Course: In the ER patient was bradycardic with a heart rate in the 40s and temperature of 90 F.  Patient was placed on a Bair hugger and since patient's symptoms were concerning for possible sepsis was started on empiric antibiotics.  Chest x-ray shows mild congestion.  Patient at the time of my exam is alert awake oriented and following commands.  Patient does have a chronic left heel wound x-ray shows soft tissue swelling with no bony involvement.  CT scan of the left heel is pending.  Review of Systems: As per HPI, rest all negative.   Past Medical History:  Diagnosis Date   Chronic combined systolic and diastolic congestive heart failure (HCC) 11/09/2021   ESRD (end stage renal disease) on dialysis [facility])    Generalized anxiety disorder    Hyperlipidemia 11/09/2021   Hypertension    PE (pulmonary thromboembolism) (HCC)     Past Surgical History:  Procedure Laterality Date   AV FISTULA PLACEMENT Left 03/27/2023   Procedure: LEFT ARM BRACHIOCEPHALIC ARTERIOVENOUS (AV)  FISTULA CREATION;  Surgeon: Carlene Che, MD;  Location: [facility] OR;  Service: Vascular;  Laterality: Left;   CATARACT EXTRACTION, BILATERAL     combined systolic and diastolic congestive heart failure      INSERTION OF DIALYSIS CATHETER N/A 03/27/2023   Procedure: INSERTION OF RIGHT INTERNAL JUGULAR TUNNELED DIALYSIS CATHETER;  Surgeon: Carlene Che, MD;  Location: [facility] OR;  Service: Vascular;  Laterality: N/A;   IR THORACENTESIS ASP PLEURAL SPACE W/IMG GUIDE  06/12/2023     reports that she has never smoked. She has never used smokeless tobacco. She reports that she does not currently use alcohol. She reports that she does not use drugs.  Allergies  Allergen Reactions   Crestor  [Rosuvastatin ] Shortness Of Breath   Nitrofuran Derivatives Nausea Only    Family History  Problem Relation Age of Onset   Throat cancer Father     Prior to Admission medications   Medication Sig Start Date End Date Taking? Authorizing Provider  apixaban  (ELIQUIS ) 2.5 MG TABS tablet Take 1 tablet (2.5 mg total) by mouth 2 (two) times daily. 01/17/24  Yes Elgergawy, Ardia Kraft, MD  Bismuth Tribromoph-Petrolatum (XEROFORM PETROLAT PATCH 2"X2" EX) Apply 1 Application topically daily. Cleanse L heel with Vashe wound cleanser do not rinse and allow to air dry. Apply Xeroform gauze to wound bed daily cover with dry gauze and Kerlix roll gauze. Place L foot in Prevalon boot to offload pressure   Yes [provider]  carvedilol  (COREG ) 3.125 MG tablet Please take 3.125 mg p.o. twice daily, but hold morning dose on dialysis days TTS AM. Patient  taking differently: Take 3.125 mg by mouth See admin instructions. Take 1 tablet by mouth for HR equal or greater than 60 bpm, but hold morning dose on dialysis days TTS AM. 01/18/24  Yes Elgergawy, Dawood S, MD  leptospermum manuka honey (MEDIHONEY) gel Apply 1 Application topically daily. Cleanse sacrum and buttocks (intact skin and open wounds) with Vashe wound do not rinse and  allow to air dry.  Apply Medihoney to wound beds daily, cover with dry gauze.  May cover area with silicone foam or ABD pad and tape.   Yes [provider]  lidocaine -prilocaine  (EMLA ) cream APPLY 1 APPLICATION TOPICALLY AS NEEDED.(USE 30 MINUTES PRIOR TO DIALYSIS). 01/16/24  Yes [name], [name], NP  Menthol -Methyl Salicylate  (MUSCLE RUB) 10-15 % CREA Apply 1 Application topically daily as needed for muscle pain.   Yes [provider]  midodrine  (PROAMATINE ) 5 MG tablet Take as needed for systolic blood pressure less than 100, and take morning during dialysis days. 01/18/24  Yes Elgergawy, Ardia Kraft, MD  multivitamin (RENA-VIT) TABS tablet Take 1 tablet by mouth daily. Patient taking differently: Take 1 tablet by mouth See admin instructions. Take 1 tablet by mouth every Mon, Wed, Fri, Sat 06/19/23  Yes [name], [name], NP  OVER THE COUNTER MEDICATION Take 1 fluid ounce by mouth 3 (three) times a week. Liquid Cell Protein - given at dialysis   Yes [provider]  pantoprazole  (PROTONIX ) 40 MG tablet Take 1 tablet (40 mg total) by mouth daily. Patient taking differently: Take 40 mg by mouth See admin instructions. Take 1 tablet by mouth daily on non-dialysis days 01/17/24 02/22/25 Yes Elgergawy, Ardia Kraft, MD  fluticasone  (FLONASE ) 50 MCG/ACT nasal spray Place 2 sprays into both nostrils daily. Patient not taking: Reported on 02/23/2024 12/09/23   Kreg Pesa, [name]  mirtazapine  (REMERON ) 7.5 MG tablet Take 1 tablet (7.5 mg total) by mouth daily. Patient not taking: Reported on 02/23/2024 01/17/24   Elgergawy, Ardia Kraft, MD    Physical Exam: Constitutional: Moderately built and nourished. Vitals:   02/23/24 2230 02/23/24 2245 02/23/24 2300 02/23/24 2315  BP: (!) 102/56 (!) 102/53    Pulse: (!) 57 (!) 57 60 (!) 59  Resp: (!) 22 (!) 23 (!) 23 (!) 22  Temp:      TempSrc:      SpO2: 100% 96% 100% 100%  Weight:      Height:       Eyes: Anicteric no pallor. ENMT: No discharge  from the ears eyes nose and mouth. Neck: No mass felt.  No neck rigidity. Respiratory: No rhonchi or crepitations. Cardiovascular: S1-S2 heard. Abdomen: Soft nontender bowel sound present. Musculoskeletal: Left heel ulcer no active discharge. Skin: Some discoloration of the left foot toes. Neurologic: Alert awake oriented to time place and person.  Moving all extremities. Psychiatric: Oriented to time place and person.   Labs on Admission: I have personally reviewed following labs and imaging studies  CBC: Recent Labs  Lab 02/23/24 1852 02/23/24 2020  WBC 2.5*  --   HGB 8.1*  --   HCT 27.2*  --   MCV 95.8  --   PLT 37* 36*   Basic Metabolic Panel: Recent Labs  Lab 02/23/24 1852  NA 136  K 3.5  CL 99  CO2 31  GLUCOSE 54*  BUN 15  CREATININE 2.14*  CALCIUM  8.4*   GFR: Estimated Creatinine Clearance: 18.8 mL/min (A) (by C-G formula based on SCr of 2.14 mg/dL (H)). Liver Function  Tests: No results for input(s): "AST", "ALT", "ALKPHOS", "BILITOT", "PROT", "ALBUMIN " in the last 168 hours. No results for input(s): "LIPASE", "AMYLASE" in the last 168 hours. No results for input(s): "AMMONIA" in the last 168 hours. Coagulation Profile: Recent Labs  Lab 02/23/24 2020  INR 1.2   Cardiac Enzymes: No results for input(s): "CKTOTAL", "CKMB", "CKMBINDEX", "TROPONINI" in the last 168 hours. BNP (last 3 results) No results for input(s): "PROBNP" in the last 8760 hours. HbA1C: No results for input(s): "HGBA1C" in the last 72 hours. CBG: No results for input(s): "GLUCAP" in the last 168 hours. Lipid Profile: No results for input(s): "CHOL", "HDL", "LDLCALC", "TRIG", "CHOLHDL", "LDLDIRECT" in the last 72 hours. Thyroid Function Tests: Recent Labs    02/23/24 1957 02/23/24 2019  TSH 2.979  --   FREET4  --  1.14*   Anemia Panel: No results for input(s): "VITAMINB12", "FOLATE", "FERRITIN", "TIBC", "IRON ", "RETICCTPCT" in the last 72 hours. Urine analysis:    Component  Value Date/Time   COLORURINE AMBER (A) 12/13/2023 0735   APPEARANCEUR CLOUDY (A) 12/13/2023 0735   LABSPEC 1.024 12/13/2023 0735   PHURINE 7.0 12/13/2023 0735   GLUCOSEU NEGATIVE 12/13/2023 0735   HGBUR SMALL (A) 12/13/2023 0735   BILIRUBINUR NEGATIVE 12/13/2023 0735   BILIRUBINUR Negative 12/05/2022 1454   KETONESUR NEGATIVE 12/13/2023 0735   PROTEINUR 100 (A) 12/13/2023 0735   UROBILINOGEN 0.2 12/05/2022 1454   UROBILINOGEN 0.2 11/19/2019 2004   NITRITE NEGATIVE 12/13/2023 0735   LEUKOCYTESUR MODERATE (A) 12/13/2023 0735   Sepsis Labs: @LABRCNTIP (procalcitonin:4,lacticidven:4) ) Recent Results (from the past 240 hours)  Resp panel by RT-PCR (RSV, Flu A&B, Covid) Anterior Nasal Swab     Status: None   Collection Time: 02/23/24  7:56 PM   Specimen: Anterior Nasal Swab  Result Value Ref Range Status   SARS Coronavirus 2 by RT PCR NEGATIVE NEGATIVE Final   Influenza A by PCR NEGATIVE NEGATIVE Final   Influenza B by PCR NEGATIVE NEGATIVE Final    Comment: (NOTE) The Xpert Xpress SARS-CoV-2/FLU/RSV plus assay is intended as an aid in the diagnosis of influenza from Nasopharyngeal swab specimens and should not be used as a sole basis for treatment. Nasal washings and aspirates are unacceptable for Xpert Xpress SARS-CoV-2/FLU/RSV testing.  Fact Sheet for Patients: BloggerCourse.com  Fact Sheet for Healthcare Providers: SeriousBroker.it  This test is not yet approved or cleared by the [place]  FDA and has been authorized for detection and/or diagnosis of SARS-CoV-2 by FDA under an Emergency Use Authorization (EUA). This EUA will remain in effect (meaning this test can be used) for the duration of the COVID-19 declaration under Section 564(b)(1) of the Act, 21 U.S.C. section 360bbb-3(b)(1), unless the authorization is terminated or revoked.     Resp Syncytial Virus by PCR NEGATIVE NEGATIVE Final    Comment: (NOTE) Fact  Sheet for Patients: BloggerCourse.com  Fact Sheet for Healthcare Providers: SeriousBroker.it  This test is not yet approved or cleared by the [place]  FDA and has been authorized for detection and/or diagnosis of SARS-CoV-2 by FDA under an Emergency Use Authorization (EUA). This EUA will remain in effect (meaning this test can be used) for the duration of the COVID-19 declaration under Section 564(b)(1) of the Act, 21 U.S.C. section 360bbb-3(b)(1), unless the authorization is terminated or revoked.  Performed at [facility], [address] [place], [place] [address]      Radiological Exams on Admission: DG Chest Port 1 View Result Date: 02/23/2024 CLINICAL DATA:  Possible  sepsis EXAM: PORTABLE CHEST 1 VIEW COMPARISON:  01/13/2024 FINDINGS: Cardiac shadow is mildly enlarged but stable. Aortic calcifications are seen. Small bilateral pleural effusions are noted stable from the prior exam. Central vascular congestion is noted without significant edema. IMPRESSION: Changes of mild CHF. Electronically Signed   By: Violeta Grey M.D.   On: 02/23/2024 20:42   DG Foot 2 Views Left Result Date: 02/23/2024 CLINICAL DATA:  Possible sepsis. Question osteomyelitis of the left foot. EXAM: LEFT FOOT - 2 VIEW COMPARISON:  12/13/2023. FINDINGS: Osteopenia. Forefoot soft tissue swelling. No osseous erosions to suggest osteomyelitis. Mild degenerative changes of the first metatarsophalangeal joint. Calcifications along the plantar aspect of the foot. IMPRESSION: Forefoot soft tissue swelling.  No evidence of osteomyelitis. Electronically Signed   By: Shearon Denis M.D.   On: 02/23/2024 20:42    EKG: Independently reviewed.  Sinus bradycardia.  Assessment/Plan Principal Problem:   Hypothermia Active Problems:   ESRD on hemodialysis (HCC)   Essential hypertension   Paroxysmal atrial fibrillation (HCC)   Bradycardia    Hypothermia -     TSH is normal.  Will check cortisol levels.  Follow blood cultures.  Primarily concerning for possible sepsis started on empiric antibiotics.  Does have chronic wound on the left heel x-rays do show some swelling CT scan has been ordered. Sinus bradycardia could be from hypothermia.  TSH normal.  I think heart rate will improve with improvement in temperature.  Closely monitor.  Hold Coreg . ESRD on hemodialysis on Tuesday Thursday and Saturday.  Consult nephrology. Chronic pancytopenia follow CBC.  Check B12 and folate levels.  Note that patient is on Eliquis  for PE. History of PE diagnosed in March 2025 on Eliquis . Hypertension hold Coreg  due to bradycardia. Peripheral vascular disease recently followed with vascular surgeon felt not be a candidate for any intervention.  Patient's daughter is concerned that the foot discoloration is worsening.  Will check ABI. Chronic diastolic CHF fluid management per nephrology. GERD on PPI.  Given that patient has hypothermia with sinus bradycardia concerning for possible sepsis will need close monitoring and more than 2 midnight stay.   DVT prophylaxis: Eliquis . Code Status: Full code. Family Communication: Patient's daughter at the bedside. Disposition Plan: Progressive care. Consults called: Wound team. Admission status: Inpatient.

## 2024-02-23 NOTE — ED Provider Notes (Signed)
  EMERGENCY DEPARTMENT AT [facility] Provider Note   CSN: [id number] Arrival date & time: 02/23/24  1727     History {Add pertinent medical, surgical, social history, OB history to HPI:1} Chief Complaint  Patient presents with   Bradycardia    Kara Mullins is a 73 y.o. female.  73 year old female with a history of ESRD on HD, CHF, hypertension, hyperlipidemia, and UTIs who presents emergency department with altered mental status and low blood pressure.  History obtained per the patient's daughter.  Reports that she has been intermittently confused over the past few days.  Also noticed that her heart rate was in the 40s at home so decided to bring her into the emergency department.  Is concerned that she may be septic since this has happened in the past.  Was treated in March for influenza with superimposed bacterial pneumonia.       Home Medications Prior to Admission medications   Medication Sig Start Date End Date Taking? Authorizing Provider  apixaban  (ELIQUIS ) 2.5 MG TABS tablet Take 1 tablet (2.5 mg total) by mouth 2 (two) times daily. 01/17/24   Elgergawy, Ardia Kraft, MD  Bismuth Tribromoph-Petrolatum (XEROFORM PETROLAT PATCH 2"X2" EX) Apply 1 Application topically daily. Cleanse L heel with Vashe wound cleanser do not rinse and allow to air dry. Apply Xeroform gauze to wound bed daily cover with dry gauze and Kerlix roll gauze. Place L foot in Prevalon boot to offload pressure    [provider]  carvedilol  (COREG ) 3.125 MG tablet Please take 3.125 mg p.o. twice daily, but hold morning dose on dialysis days TTS AM. 01/18/24   Elgergawy, Ardia Kraft, MD  fluticasone  (FLONASE ) 50 MCG/ACT nasal spray Place 2 sprays into both nostrils daily. 12/09/23   White, Elizabeth A, [name]  leptospermum manuka honey (MEDIHONEY) gel Apply 1 Application topically daily. Cleanse sacrum and buttocks (intact skin and open wounds) with Vashe wound do not rinse and allow to  air dry.  Apply Medihoney to wound beds daily, cover with dry gauze.  May cover area with silicone foam or ABD pad and tape.    [provider]  lidocaine -prilocaine  (EMLA ) cream APPLY 1 APPLICATION TOPICALLY AS NEEDED.(USE 30 MINUTES PRIOR TO DIALYSIS). 01/16/24   [name], [name], NP  Menthol -Methyl Salicylate  (MUSCLE RUB) 10-15 % CREA Apply 1 Application topically daily as needed for muscle pain.    [provider]  midodrine  (PROAMATINE ) 5 MG tablet Take as needed for systolic blood pressure less than 100, and take morning during dialysis days. 01/18/24   Elgergawy, Ardia Kraft, MD  mirtazapine  (REMERON ) 7.5 MG tablet Take 1 tablet (7.5 mg total) by mouth daily. 01/17/24   Elgergawy, Dawood S, MD  multivitamin (RENA-VIT) TABS tablet Take 1 tablet by mouth daily. Patient taking differently: Take 1 tablet by mouth See admin instructions. Take 1 tablet by mouth every Mon, Wed, Fri, Sat 06/19/23   [name], [name], NP  OVER THE COUNTER MEDICATION Take 1 fluid ounce by mouth daily. Liquid Cell Protein    [provider]  pantoprazole  (PROTONIX ) 40 MG tablet Take 1 tablet (40 mg total) by mouth daily. 01/17/24 02/16/24  Elgergawy, Ardia Kraft, MD  predniSONE  (DELTASONE ) 10 MG tablet Take two tablets by mouth daily with breakfast for 5 days, then take one tablet by mouth daily with breakfast for 5 days, then take one-half tablet daily with breakfast for 5 days, then stop. 01/17/24   Elgergawy, Ardia Kraft, MD  silver  sulfADIAZINE  (SILVADENE )  1 % cream Apply 1 Application topically daily. Patient not taking: Reported on 01/20/2024 12/02/23   Charity Conch, [name]      Allergies    Crestor  [rosuvastatin ] and Nitrofuran derivatives    Review of Systems   Review of Systems  Physical Exam Updated Vital Signs BP 107/62   Pulse 98   Temp (!) 90.1 F (32.3 C) (Rectal)   Resp 17   Ht 5\' 5"  (1.651 m)   Wt 49.4 kg   SpO2 98%   BMI 18.14 kg/m  Physical Exam Vitals and nursing note reviewed.   Constitutional:      General: She is not in acute distress.    Appearance: She is well-developed.  HENT:     Head: Normocephalic and atraumatic.     Right Ear: External ear normal.     Left Ear: External ear normal.     Nose: Nose normal.  Eyes:     Extraocular Movements: Extraocular movements intact.     Conjunctiva/sclera: Conjunctivae normal.     Pupils: Pupils are equal, round, and reactive to light.  Cardiovascular:     Rate and Rhythm: Regular rhythm. Bradycardia present.     Heart sounds: No murmur heard. Pulmonary:     Effort: Pulmonary effort is normal. No respiratory distress.     Breath sounds: Normal breath sounds.  Abdominal:     General: Abdomen is flat. There is no distension.     Palpations: Abdomen is soft. There is no mass.     Tenderness: There is no abdominal tenderness. There is no guarding.  Musculoskeletal:     Cervical back: Normal range of motion and neck supple.     Right lower leg: No edema.     Left lower leg: No edema.     Comments: Fistula in left upper extremity with bruit and thrill  Skin:    General: Skin is warm and dry.     Comments: Stage II pressure ulcer to sacrum.  Stage III pressure ulcer to left heel.  Neurological:     Mental Status: She is alert. Mental status is at baseline.     Motor: No weakness.  Psychiatric:        Mood and Affect: Mood normal.     ED Results / Procedures / Treatments   Labs (all labs ordered are listed, but only abnormal results are displayed) Labs Reviewed  BASIC METABOLIC PANEL WITH GFR - Abnormal; Notable for the following components:      Result Value   Glucose, Bld 54 (*)    Creatinine, Ser 2.14 (*)    Calcium  8.4 (*)    GFR, Estimated 24 (*)    All other components within normal limits  CBC - Abnormal; Notable for the following components:   WBC 2.5 (*)    RBC 2.84 (*)    Hemoglobin 8.1 (*)    HCT 27.2 (*)    MCHC 29.8 (*)    RDW 18.2 (*)    Platelets 37 (*)    nRBC 2.9 (*)    All other  components within normal limits  RESP PANEL BY RT-PCR (RSV, FLU A&B, COVID)  [id number]  CULTURE, BLOOD (ROUTINE X 2)  CULTURE, BLOOD (ROUTINE X 2)  DIC (DISSEMINATED INTRAVASCULAR COAGULATION)PANEL  URINALYSIS, W/ REFLEX TO CULTURE (INFECTION SUSPECTED)  TSH  T4, FREE  I-STAT CG4 LACTIC ACID, ED    EKG EKG Interpretation Date/Time:  Monday Feb 23 2024 18:58:58 EDT Ventricular Rate:  46 PR Interval:  190 QRS Duration:  106 QT Interval:  512 QTC Calculation: 448 R Axis:   125  Text Interpretation: Sinus bradycardia Right axis deviation Cannot rule out Anterior infarct , age undetermined Abnormal ECG When compared with ECG of 11-Jan-2024 03:32, PREVIOUS ECG IS PRESENT Confirmed by Shyrl Doyne [phone]) on 02/23/2024 7:30:11 PM  Radiology No results found.  Procedures Procedures  {Document cardiac monitor, telemetry assessment procedure when appropriate:1}  Medications Ordered in ED Medications - No data to display  ED Course/ Medical Decision Making/ A&P   {   Click here for ABCD2, HEART and other calculatorsREFRESH Note before signing :1}                              Medical Decision Making Amount and/or Complexity of Data Reviewed Labs: ordered. Radiology: ordered.   ***  {Document critical care time when appropriate:1} {Document review of labs and clinical decision tools ie heart score, Chads2Vasc2 etc:1}  {Document your independent review of radiology images, and any outside records:1} {Document your discussion with family members, caretakers, and with consultants:1} {Document social determinants of health affecting pt's care:1} {Document your decision making why or why not admission, treatments were needed:1} Final Clinical Impression(s) / ED Diagnoses Final diagnoses:  None    Rx / DC Orders ED Discharge Orders     None

## 2024-02-23 NOTE — ED Notes (Signed)
 This NT tried to get this pts temp x3 and it wouldn't take on the machine. Pts skin feels cool to the touch. RN notifed.

## 2024-02-23 NOTE — Telephone Encounter (Signed)
 Called and LMOM with Loetta Ringer with [facility] [phone] to restart Home Health PT per [name].

## 2024-02-23 NOTE — Sepsis Progress Note (Signed)
 Elink following for sepsis protocol.

## 2024-02-23 NOTE — ED Notes (Signed)
 Unable to get a temp with a different Thermometer.

## 2024-02-23 NOTE — Telephone Encounter (Signed)
 HH PT ordered 12/2023 okay to give verbal orders for re certification as requested by POA.

## 2024-02-23 NOTE — ED Notes (Signed)
 Pt's family member verbalized that pt makes a very minimal urine "almost none."

## 2024-02-23 NOTE — ED Notes (Signed)
 Pump beeping, RN went to investigate.  Pt asked several times if her arm hurt, she said it did not.  Pt's IV did infiltrate.

## 2024-02-23 NOTE — Progress Notes (Signed)
 Responded to consult for IV. Restricted L arm. Infiltration at previous IV site. US  IV obtained in R brachial as this was the only available site. Recommend considering CVC for any additional access needs this hospital stay.

## 2024-02-23 NOTE — Progress Notes (Signed)
 ED Pharmacy Antibiotic Sign Off An antibiotic consult was received from an ED provider for cefepime  and vancomycin  per pharmacy dosing for sepsis. A chart review was completed to assess appropriateness.  The following one time order(s) were placed per pharmacy consult:  cefepime  2000 mg x 1 dose vancomycin  1500 mg x 1 dose  Further antibiotic and/or antibiotic pharmacy consults should be ordered by the admitting provider if indicated.   Thank you for allowing pharmacy to be a part of this patient's care.   Dionicio Fray, PharmD, BCPS 02/23/2024 8:01 PM ED Clinical Pharmacist -  [phone]

## 2024-02-23 NOTE — ED Notes (Signed)
Bare warmer applied .

## 2024-02-24 ENCOUNTER — Inpatient Hospital Stay (HOSPITAL_COMMUNITY): Payer: Medicare (Managed Care) | Admitting: Critical Care Medicine

## 2024-02-24 ENCOUNTER — Inpatient Hospital Stay (HOSPITAL_COMMUNITY): Payer: Medicare (Managed Care)

## 2024-02-24 ENCOUNTER — Encounter (HOSPITAL_COMMUNITY): Payer: Self-pay | Admitting: Internal Medicine

## 2024-02-24 ENCOUNTER — Ambulatory Visit: Payer: Medicare (Managed Care) | Admitting: Family

## 2024-02-24 DIAGNOSIS — R001 Bradycardia, unspecified: Secondary | ICD-10-CM | POA: Diagnosis not present

## 2024-02-24 DIAGNOSIS — R680 Hypothermia, not associated with low environmental temperature: Secondary | ICD-10-CM

## 2024-02-24 DIAGNOSIS — J9601 Acute respiratory failure with hypoxia: Secondary | ICD-10-CM | POA: Diagnosis not present

## 2024-02-24 DIAGNOSIS — I469 Cardiac arrest, cause unspecified: Secondary | ICD-10-CM | POA: Diagnosis not present

## 2024-02-24 DIAGNOSIS — M86172 Other acute osteomyelitis, left ankle and foot: Secondary | ICD-10-CM

## 2024-02-24 DIAGNOSIS — E162 Hypoglycemia, unspecified: Secondary | ICD-10-CM

## 2024-02-24 DIAGNOSIS — T68XXXA Hypothermia, initial encounter: Secondary | ICD-10-CM | POA: Diagnosis not present

## 2024-02-24 DIAGNOSIS — L97429 Non-pressure chronic ulcer of left heel and midfoot with unspecified severity: Secondary | ICD-10-CM | POA: Diagnosis not present

## 2024-02-24 DIAGNOSIS — D61818 Other pancytopenia: Secondary | ICD-10-CM | POA: Diagnosis not present

## 2024-02-24 DIAGNOSIS — A419 Sepsis, unspecified organism: Secondary | ICD-10-CM | POA: Diagnosis not present

## 2024-02-24 DIAGNOSIS — N186 End stage renal disease: Secondary | ICD-10-CM | POA: Diagnosis not present

## 2024-02-24 DIAGNOSIS — E43 Unspecified severe protein-calorie malnutrition: Secondary | ICD-10-CM

## 2024-02-24 LAB — PHOSPHORUS: Phosphorus: 3 mg/dL (ref 2.5–4.6)

## 2024-02-24 LAB — COMPREHENSIVE METABOLIC PANEL WITH GFR
ALT: 15 U/L (ref 0–44)
AST: 49 U/L — ABNORMAL HIGH (ref 15–41)
Albumin: 2.6 g/dL — ABNORMAL LOW (ref 3.5–5.0)
Alkaline Phosphatase: 73 U/L (ref 38–126)
Anion gap: 6 (ref 5–15)
BUN: 18 mg/dL (ref 8–23)
CO2: 29 mmol/L (ref 22–32)
Calcium: 8.4 mg/dL — ABNORMAL LOW (ref 8.9–10.3)
Chloride: 100 mmol/L (ref 98–111)
Creatinine, Ser: 2.33 mg/dL — ABNORMAL HIGH (ref 0.44–1.00)
GFR, Estimated: 22 mL/min — ABNORMAL LOW (ref >60)
Glucose, Bld: 64 mg/dL — ABNORMAL LOW (ref 70–99)
Potassium: 4.4 mmol/L (ref 3.5–5.1)
Sodium: 135 mmol/L (ref 135–145)
Total Bilirubin: 1.1 mg/dL (ref 0.0–1.2)
Total Protein: 6.4 g/dL — ABNORMAL LOW (ref 6.5–8.1)

## 2024-02-24 LAB — CBC WITH DIFFERENTIAL/PLATELET
Abs Immature Granulocytes: 0.02 10*3/uL (ref 0.00–0.07)
Abs Immature Granulocytes: 0.02 10*3/uL (ref 0.00–0.07)
Basophils Absolute: 0 10*3/uL (ref 0.0–0.1)
Basophils Absolute: 0 10*3/uL (ref 0.0–0.1)
Basophils Relative: 0 %
Basophils Relative: 0 %
Eosinophils Absolute: 0 10*3/uL (ref 0.0–0.5)
Eosinophils Absolute: 0 10*3/uL (ref 0.0–0.5)
Eosinophils Relative: 1 %
Eosinophils Relative: 0 %
HCT: 23.9 % — ABNORMAL LOW (ref 36.0–46.0)
HCT: 20 % — ABNORMAL LOW (ref 36.0–46.0)
Hemoglobin: 7.3 g/dL — ABNORMAL LOW (ref 12.0–15.0)
Hemoglobin: 5.9 g/dL — CL (ref 12.0–15.0)
Immature Granulocytes: 1 %
Immature Granulocytes: 0 %
Lymphocytes Relative: 14 %
Lymphocytes Relative: 51 %
Lymphs Abs: 0.5 10*3/uL — ABNORMAL LOW (ref 0.7–4.0)
Lymphs Abs: 3.8 10*3/uL (ref 0.7–4.0)
MCH: 28.5 pg (ref 26.0–34.0)
MCH: 28.4 pg (ref 26.0–34.0)
MCHC: 30.5 g/dL (ref 30.0–36.0)
MCHC: 29.5 g/dL — ABNORMAL LOW (ref 30.0–36.0)
MCV: 93.4 fL (ref 80.0–100.0)
MCV: 96.2 fL (ref 80.0–100.0)
Monocytes Absolute: 0.2 10*3/uL (ref 0.1–1.0)
Monocytes Absolute: 0.7 10*3/uL (ref 0.1–1.0)
Monocytes Relative: 7 %
Monocytes Relative: 9 %
Neutro Abs: 2.5 10*3/uL (ref 1.7–7.7)
Neutro Abs: 3 10*3/uL (ref 1.7–7.7)
Neutrophils Relative %: 77 %
Neutrophils Relative %: 40 %
Platelets: 41 10*3/uL — ABNORMAL LOW (ref 150–400)
Platelets: 35 10*3/uL — ABNORMAL LOW (ref 150–400)
RBC: 2.56 MIL/uL — ABNORMAL LOW (ref 3.87–5.11)
RBC: 2.08 MIL/uL — ABNORMAL LOW (ref 3.87–5.11)
RDW: 18.9 % — ABNORMAL HIGH (ref 11.5–15.5)
RDW: 18.4 % — ABNORMAL HIGH (ref 11.5–15.5)
Smear Review: NORMAL
WBC: 3.2 10*3/uL — ABNORMAL LOW (ref 4.0–10.5)
WBC: 7.5 10*3/uL (ref 4.0–10.5)
nRBC: 1.6 % — ABNORMAL HIGH (ref 0.0–0.2)
nRBC: 2.3 % — ABNORMAL HIGH (ref 0.0–0.2)

## 2024-02-24 LAB — BASIC METABOLIC PANEL WITH GFR
Anion gap: 8 (ref 5–15)
BUN: 18 mg/dL (ref 8–23)
CO2: 23 mmol/L (ref 22–32)
Calcium: 7.3 mg/dL — ABNORMAL LOW (ref 8.9–10.3)
Chloride: 105 mmol/L (ref 98–111)
Creatinine, Ser: 2.36 mg/dL — ABNORMAL HIGH (ref 0.44–1.00)
GFR, Estimated: 21 mL/min — ABNORMAL LOW (ref >60)
Glucose, Bld: 175 mg/dL — ABNORMAL HIGH (ref 70–99)
Potassium: 3.3 mmol/L — ABNORMAL LOW (ref 3.5–5.1)
Sodium: 136 mmol/L (ref 135–145)

## 2024-02-24 LAB — C-REACTIVE PROTEIN: CRP: 6.4 mg/dL — ABNORMAL HIGH (ref <1.0)

## 2024-02-24 LAB — POCT I-STAT 7, (LYTES, BLD GAS, ICA,H+H)
Acid-Base Excess: 0 mmol/L (ref 0.0–2.0)
Bicarbonate: 25.1 mmol/L (ref 20.0–28.0)
Calcium, Ion: 1.15 mmol/L (ref 1.15–1.40)
HCT: 21 % — ABNORMAL LOW (ref 36.0–46.0)
Hemoglobin: 7.1 g/dL — ABNORMAL LOW (ref 12.0–15.0)
O2 Saturation: 100 %
Patient temperature: 97.8
Potassium: 3.3 mmol/L — ABNORMAL LOW (ref 3.5–5.1)
Sodium: 136 mmol/L (ref 135–145)
TCO2: 26 mmol/L (ref 22–32)
pCO2 arterial: 38.8 mmHg (ref 32–48)
pH, Arterial: 7.417 (ref 7.35–7.45)
pO2, Arterial: 442 mmHg — ABNORMAL HIGH (ref 83–108)

## 2024-02-24 LAB — GLUCOSE, CAPILLARY
Glucose-Capillary: 100 mg/dL — ABNORMAL HIGH (ref 70–99)
Glucose-Capillary: 137 mg/dL — ABNORMAL HIGH (ref 70–99)
Glucose-Capillary: 158 mg/dL — ABNORMAL HIGH (ref 70–99)
Glucose-Capillary: 53 mg/dL — ABNORMAL LOW (ref 70–99)
Glucose-Capillary: 68 mg/dL — ABNORMAL LOW (ref 70–99)
Glucose-Capillary: 75 mg/dL (ref 70–99)

## 2024-02-24 LAB — HEPATITIS B SURFACE ANTIGEN: Hepatitis B Surface Ag: NONREACTIVE

## 2024-02-24 LAB — PREPARE RBC (CROSSMATCH)

## 2024-02-24 LAB — TROPONIN I (HIGH SENSITIVITY)
Troponin I (High Sensitivity): 9 ng/L (ref <18)
Troponin I (High Sensitivity): 8 ng/L (ref <18)

## 2024-02-24 LAB — CBG MONITORING, ED: Glucose-Capillary: 122 mg/dL — ABNORMAL HIGH (ref 70–99)

## 2024-02-24 LAB — MRSA NEXT GEN BY PCR, NASAL: MRSA by PCR Next Gen: NOT DETECTED

## 2024-02-24 LAB — FOLATE: Folate: 37.1 ng/mL (ref >5.9)

## 2024-02-24 LAB — LACTIC ACID, PLASMA
Lactic Acid, Venous: 1.1 mmol/L (ref 0.5–1.9)
Lactic Acid, Venous: 2 mmol/L (ref 0.5–1.9)
Lactic Acid, Venous: 2.9 mmol/L (ref 0.5–1.9)

## 2024-02-24 LAB — MAGNESIUM: Magnesium: 1.8 mg/dL (ref 1.7–2.4)

## 2024-02-24 LAB — CORTISOL: Cortisol, Plasma: 11.5 ug/dL

## 2024-02-24 LAB — VITAMIN B12: Vitamin B-12: 1942 pg/mL — ABNORMAL HIGH (ref 180–914)

## 2024-02-24 LAB — SEDIMENTATION RATE: Sed Rate: 40 mm/h — ABNORMAL HIGH (ref 0–22)

## 2024-02-24 MED ORDER — OXIDIZED CELLULOSE EX PADS
1.0000 | MEDICATED_PAD | Freq: Once | CUTANEOUS | Status: AC
  Administered 2024-02-25: 1 via TOPICAL
  Filled 2024-02-24: qty 1

## 2024-02-24 MED ORDER — MEDIHONEY WOUND/BURN DRESSING EX PSTE
1.0000 | PASTE | Freq: Every day | CUTANEOUS | Status: DC
  Administered 2024-02-24 – 2024-02-28 (×5): 1 via TOPICAL
  Filled 2024-02-24: qty 44

## 2024-02-24 MED ORDER — POLYETHYLENE GLYCOL 3350 17 G PO PACK: 17.0000 g | PACK | Freq: Every day | ORAL | Status: DC | PRN

## 2024-02-24 MED ORDER — FENTANYL CITRATE PF 50 MCG/ML IJ SOSY
25.0000 ug | PREFILLED_SYRINGE | Freq: Once | INTRAMUSCULAR | Status: DC
  Filled 2024-02-24: qty 1

## 2024-02-24 MED ORDER — APIXABAN 2.5 MG PO TABS
2.5000 mg | ORAL_TABLET | Freq: Two times a day (BID) | ORAL | Status: DC
  Administered 2024-02-24: 2.5 mg via ORAL
  Filled 2024-02-24 (×2): qty 1

## 2024-02-24 MED ORDER — RENA-VITE PO TABS
1.0000 | ORAL_TABLET | ORAL | Status: DC
  Administered 2024-02-25: 1

## 2024-02-24 MED ORDER — DOCUSATE SODIUM 100 MG PO CAPS: 100.0000 mg | ORAL_CAPSULE | Freq: Two times a day (BID) | ORAL | Status: DC | PRN

## 2024-02-24 MED ORDER — ENSURE ENLIVE PO LIQD
237.0000 mL | Freq: Three times a day (TID) | ORAL | Status: DC
  Administered 2024-02-24: 237 mL via ORAL
  Filled 2024-02-24: qty 237

## 2024-02-24 MED ORDER — MIDODRINE HCL 5 MG PO TABS
10.0000 mg | ORAL_TABLET | Freq: Three times a day (TID) | ORAL | Status: DC
  Administered 2024-02-24: 10 mg via ORAL
  Filled 2024-02-24: qty 2

## 2024-02-24 MED ORDER — CHLORHEXIDINE GLUCONATE CLOTH 2 % EX PADS
6.0000 | MEDICATED_PAD | Freq: Every day | CUTANEOUS | Status: DC
  Administered 2024-02-25 – 2024-02-26 (×2): 6 via TOPICAL

## 2024-02-24 MED ORDER — FENTANYL 2500MCG IN NS 250ML (10MCG/ML) PREMIX INFUSION
25.0000 ug/h | INTRAVENOUS | Status: DC
  Administered 2024-02-24: 25 ug/h via INTRAVENOUS
  Filled 2024-02-24: qty 250

## 2024-02-24 MED ORDER — SODIUM CHLORIDE 0.9% IV SOLUTION: Freq: Once | INTRAVENOUS | Status: AC

## 2024-02-24 MED ORDER — SODIUM CHLORIDE 0.9 % IV BOLUS
500.0000 mL | Freq: Once | INTRAVENOUS | Status: AC
  Administered 2024-02-24: 500 mL via INTRAVENOUS

## 2024-02-24 MED ORDER — VANCOMYCIN VARIABLE DOSE PER UNSTABLE RENAL FUNCTION (PHARMACIST DOSING): Status: DC

## 2024-02-24 MED ORDER — SODIUM CHLORIDE 0.9% IV SOLUTION: Freq: Once | INTRAVENOUS | Status: DC

## 2024-02-24 MED ORDER — MIDODRINE HCL 5 MG PO TABS
5.0000 mg | ORAL_TABLET | Freq: Three times a day (TID) | ORAL | Status: DC
  Administered 2024-02-24: 5 mg via ORAL
  Filled 2024-02-24: qty 1

## 2024-02-24 MED ORDER — APIXABAN 2.5 MG PO TABS: 2.5000 mg | ORAL_TABLET | Freq: Two times a day (BID) | ORAL | Status: DC

## 2024-02-24 MED ORDER — RENA-VITE PO TABS: 1.0000 | ORAL_TABLET | ORAL | Status: DC

## 2024-02-24 MED ORDER — INSULIN ASPART 100 UNIT/ML IJ SOLN: 0.0000 [IU] | INTRAMUSCULAR | Status: DC

## 2024-02-24 MED ORDER — FENTANYL BOLUS VIA INFUSION
25.0000 ug | INTRAVENOUS | Status: DC | PRN
  Administered 2024-02-24: 50 ug via INTRAVENOUS
  Administered 2024-02-25 (×4): 25 ug via INTRAVENOUS

## 2024-02-24 MED ORDER — POLYETHYLENE GLYCOL 3350 17 G PO PACK
17.0000 g | PACK | Freq: Every day | ORAL | Status: DC
  Administered 2024-02-24 – 2024-02-26 (×3): 17 g
  Filled 2024-02-24 (×2): qty 1

## 2024-02-24 MED ORDER — HYDROCORTISONE SOD SUC (PF) 100 MG IJ SOLR
100.0000 mg | Freq: Three times a day (TID) | INTRAMUSCULAR | Status: DC
  Administered 2024-02-24: 100 mg via INTRAVENOUS
  Filled 2024-02-24: qty 2

## 2024-02-24 MED ORDER — MIDODRINE HCL 5 MG PO TABS
10.0000 mg | ORAL_TABLET | Freq: Three times a day (TID) | ORAL | Status: DC
  Administered 2024-02-25 – 2024-02-27 (×6): 10 mg
  Filled 2024-02-24 (×4): qty 2

## 2024-02-24 MED ORDER — HYDROCORTISONE SOD SUC (PF) 100 MG IJ SOLR
100.0000 mg | Freq: Two times a day (BID) | INTRAMUSCULAR | Status: DC
  Administered 2024-02-25: 100 mg via INTRAVENOUS
  Filled 2024-02-24: qty 2

## 2024-02-24 MED ORDER — DEXTROSE 50 % IV SOLN
1.0000 | Freq: Once | INTRAVENOUS | Status: AC
  Administered 2024-02-24: 50 mL via INTRAVENOUS

## 2024-02-24 MED ORDER — PROPOFOL 1000 MG/100ML IV EMUL
0.0000 ug/kg/min | INTRAVENOUS | Status: DC
  Administered 2024-02-24: 5 ug/kg/min via INTRAVENOUS
  Filled 2024-02-24 (×2): qty 100

## 2024-02-24 MED ORDER — PANTOPRAZOLE SODIUM 40 MG PO TBEC
40.0000 mg | DELAYED_RELEASE_TABLET | Freq: Every day | ORAL | Status: DC
  Administered 2024-02-24: 40 mg via ORAL
  Filled 2024-02-24: qty 1

## 2024-02-24 MED ORDER — DEXTROSE 5 % IV SOLN: INTRAVENOUS | Status: AC

## 2024-02-24 MED ORDER — POLYETHYLENE GLYCOL 3350 17 G PO PACK
17.0000 g | PACK | Freq: Every day | ORAL | Status: DC | PRN
Start: 2024-02-24 — End: 2024-02-24

## 2024-02-24 MED ORDER — IPRATROPIUM-ALBUTEROL 0.5-2.5 (3) MG/3ML IN SOLN
3.0000 mL | Freq: Four times a day (QID) | RESPIRATORY_TRACT | Status: DC
  Administered 2024-02-24 – 2024-02-26 (×6): 3 mL via RESPIRATORY_TRACT
  Filled 2024-02-24 (×6): qty 3

## 2024-02-24 MED ORDER — SODIUM CHLORIDE 0.9 % IV SOLN
1.0000 g | INTRAVENOUS | Status: DC
  Administered 2024-02-24: 1 g via INTRAVENOUS
  Filled 2024-02-24 (×3): qty 10

## 2024-02-24 MED ORDER — CADEXOMER IODINE 0.9 % EX GEL
Freq: Every day | CUTANEOUS | Status: DC
  Filled 2024-02-24 (×2): qty 40

## 2024-02-24 MED ORDER — DEXTROSE 50 % IV SOLN
1.0000 | Freq: Once | INTRAVENOUS | Status: AC
  Administered 2024-02-24: 50 mL via INTRAVENOUS
  Filled 2024-02-24: qty 50

## 2024-02-24 MED ORDER — DEXTROSE 50 % IV SOLN
1.0000 | Freq: Once | INTRAVENOUS | Status: DC
  Filled 2024-02-24: qty 50

## 2024-02-24 MED ORDER — MIRTAZAPINE 15 MG PO TABS: 7.5000 mg | ORAL_TABLET | ORAL | Status: DC

## 2024-02-24 NOTE — Progress Notes (Addendum)
 [facility] PROGRESS NOTE   Makaylah Lochan JXB:[id number] DOB: 1951-03-07 DOA: 02/23/2024  PCP: Estil Heman, NP  Brief History: 73 y.o. female with history of ESRD on hemodialysis on Tuesday Thursday Saturdays, recently diagnosed PE, chronic pancytopenia, chronic diastolic dysfunction, hypertension recently admitted to the hospital twice first time for pneumonia from influenza then following which patient also was admitted for pneumonia from aspiration discharged on 01/18/2024 was brought to the ER after patient's daughter felt that patient was not doing well and was found to be bradycardic.  Patient did not have any productive cough nausea vomiting or diarrhea.  Patient was noted to be hypothermic.  She was hospitalized for further management.   Consultants: Podiatry.  Nephrology  Procedures: None yet    Subjective/Interval History: Patient somewhat confused this morning.  Her daughter is at the bedside who was able to answer questions.  Patient denies any pain issues.  Does not appear to be in any discomfort or distress.    Assessment/Plan:  ADDENDUM Called this afternoon due to recurrent hypotension.  Glucose level again noted to be quite low.  Patient was given another fluid bolus and D50.  Blood pressure initially noted to respond.  In the meantime, as discussed below, she was noted to have received steroids during previous hospitalization in April.  Concern was for adrenal insufficiency.  She was initiated on hydrocortisone .  However blood pressure remained low despite multiple fluid boluses.  Critical care medicine was called to evaluate patient.  Patient rapidly declined and became unresponsive.  Code was called.  CCM attending was in the room by them.  Patient was given 1 dose of epinephrine .  Noted to have a pulse.  She was intubated by anesthesia.  Did not require chest compressions.  Signout provided to Dr. Marene Shape, CCM attending.  Care was handed over to  them.  Family was in the room throughout this episode.  Hypothermia Noted to have low glucose levels this morning which could be the reason why she is hypothermic.  She was noted to have hypoglycemia event yesterday.  Does not appear that any intervention was done at that time.  Unclear if she ate anything yesterday evening.  Will give D50 and recheck CBGs. TSH noted to be normal.  Follow-up on blood cultures due to concern for sepsis. Patient started on empiric broad-spectrum antibiotics with cefepime  and vancomycin .  Hypoglycemia No known history of diabetes.  Patient's daughter mentions that she has had poor oral intake recently and has lost some weight as well.  Monitor CBGs. Encourage oral intake.  Ensure. Recurrent hypoglycemia this afternoon.  Will start her on D5 infusion.  Hypotension and sinus bradycardia According to daughter patient has borderline low blood pressures.  She takes midodrine  on the days of dialysis.  Will give her IV fluid bolus.  Her carvedilol  is on hold. TSH is normal. Will resume midodrine . Again had low blood pressure this afternoon in the setting of recurrent hypoglycemia.  She did not get her noon midodrine  as well.  Fluid bolus ordered.'s after D50 her CBG improved resulting in improvement in mentation and blood pressure.  CODE STATUS discussed with daughter.  Based on conversations during previous admission patient would want to be full code according to daughter. Lactic acid level was rechecked earlier today and noted to be normal. It looks like patient received steroids during previous admission.  There could be an element of adrenal insufficiency.  Will place her on hydrocortisone .  End-stage renal disease on hemodialysis  Dialyzed on a Tuesday Thursday Saturday schedule.  Nephrology has been notified. Left heel wound with concern for calcaneal osteomyelitis Followed by podiatry.  They have been consulted.  Recently seen by vascular surgery and is not a  candidate for any vascular interventions.  Normocytic anemia/thrombocytopenia Hemoglobin slightly lower than her usual baseline.  Platelet counts noted to be low as well which has been low previously as well.  No evidence for bleeding.  Continue to monitor labs.  Transfuse if hemoglobin drops below 7.  History of PE This was diagnosed in March 2025.  Patient is on apixaban .  Peripheral artery disease Recently seen by vascular surgery and not a candidate for intervention.  Chronic diastolic CHF Volume management with hemodialysis.  DVT Prophylaxis: On apixaban  Code Status: Full code Family Communication: Discussed with daughter Disposition Plan: Hopefully return home when improved.  PT and OT eval.  Status is: Inpatient Remains inpatient appropriate because: Hypothermia      Medications: Scheduled:  apixaban   2.5 mg Oral BID   dextrose   1 ampule Intravenous Once   leptospermum manuka honey  1 Application Topical Daily   midodrine   5 mg Oral TID WC   [START ON 02/25/2024] multivitamin  1 tablet Oral Once per day on Monday Wednesday Friday Saturday   pantoprazole   40 mg Oral Daily   vancomycin  variable dose per unstable renal function (pharmacist dosing)   Does not apply See admin instructions   Continuous:  ceFEPime  (MAXIPIME ) IV     sodium chloride      PRN:  Antibiotics: Anti-infectives (From admission, onward)    Start     Dose/Rate Route Frequency Ordered Stop   02/24/24 2000  ceFEPIme  (MAXIPIME ) 1 g in sodium chloride  0.9 % 100 mL IVPB        1 g 200 mL/hr over 30 Minutes Intravenous Every 24 hours 02/24/24 0141     02/24/24 0141  vancomycin  variable dose per unstable renal function (pharmacist dosing)         Does not apply See admin instructions 02/24/24 0141     02/23/24 2015  ceFEPIme  (MAXIPIME ) 2 g in sodium chloride  0.9 % 100 mL IVPB        2 g 200 mL/hr over 30 Minutes Intravenous  Once 02/23/24 2001 02/23/24 2114   02/23/24 2015  vancomycin  (VANCOCIN )  IVPB 1000 mg/200 mL premix        1,000 mg 200 mL/hr over 60 Minutes Intravenous  Once 02/23/24 2001 02/23/24 2142       Objective:  Vital Signs  Vitals:   02/24/24 0200 02/24/24 0500 02/24/24 0700 02/24/24 0800  BP: 114/69 92/75 (!) 83/54   Pulse: 65 (!) 59 (!) 42   Resp: (!) 24 (!) 21 19   Temp: 98.7 F (37.1 C) 98.4 F (36.9 C)  (!) 95 F (35 C)  TempSrc: Oral Oral    SpO2: 96% 95% (!) 57%   Weight:      Height:        Intake/Output Summary (Last 24 hours) at 02/24/2024 0908 Last data filed at 02/24/2024 0248 Gross per 24 hour  Intake 500 ml  Output --  Net 500 ml   Filed Weights   02/23/24 1847  Weight: 49.4 kg    General appearance: Awake alert.  In no distress.  Distracted.  In no distress Resp: Clear to auscultation bilaterally.  Normal effort Cardio: S1-S2 is normal regular.  No S3-S4.  No rubs murmurs or bruit GI: Abdomen is soft.  Nontender nondistended.  Bowel sounds are present normal.  No masses organomegaly Extremities: No edema.  Moving all 4 extremities.  Physical deconditioning is noted.  Left heel wound is noted to be dry with scab.  No drainage noted. Neurologic: No focal neurological deficits.    Lab Results:  Data Reviewed: I have personally reviewed following labs and reports of the imaging studies  CBC: Recent Labs  Lab 02/23/24 1852 02/23/24 2020 02/24/24 0549  WBC 2.5*  --  3.2*  NEUTROABS  --   --  2.5  HGB 8.1*  --  7.3*  HCT 27.2*  --  23.9*  MCV 95.8  --  93.4  PLT 37* 36* 41*    Basic Metabolic Panel: Recent Labs  Lab 02/23/24 1852 02/24/24 0549  NA 136 135  K 3.5 4.4  CL 99 100  CO2 31 29  GLUCOSE 54* 64*  BUN 15 18  CREATININE 2.14* 2.33*  CALCIUM  8.4* 8.4*    GFR: Estimated Creatinine Clearance: 17.3 mL/min (A) (by C-G formula based on SCr of 2.33 mg/dL (H)).  Liver Function Tests: Recent Labs  Lab 02/24/24 0549  AST 49*  ALT 15  ALKPHOS 73  BILITOT 1.1  PROT 6.4*  ALBUMIN  2.6*    Coagulation  Profile: Recent Labs  Lab 02/23/24 2020  INR 1.2    Thyroid Function Tests: Recent Labs    02/23/24 1957 02/23/24 2019  TSH 2.979  --   FREET4  --  1.14*   Recent Results (from the past 240 hours)  Resp panel by RT-PCR (RSV, Flu A&B, Covid) Anterior Nasal Swab     Status: None   Collection Time: 02/23/24  7:56 PM   Specimen: Anterior Nasal Swab  Result Value Ref Range Status   SARS Coronavirus 2 by RT PCR NEGATIVE NEGATIVE Final   Influenza A by PCR NEGATIVE NEGATIVE Final   Influenza B by PCR NEGATIVE NEGATIVE Final    Comment: (NOTE) The Xpert Xpress SARS-CoV-2/FLU/RSV plus assay is intended as an aid in the diagnosis of influenza from Nasopharyngeal swab specimens and should not be used as a sole basis for treatment. Nasal washings and aspirates are unacceptable for Xpert Xpress SARS-CoV-2/FLU/RSV testing.  Fact Sheet for Patients: BloggerCourse.com  Fact Sheet for Healthcare Providers: SeriousBroker.it  This test is not yet approved or cleared by the [place]  FDA and has been authorized for detection and/or diagnosis of SARS-CoV-2 by FDA under an Emergency Use Authorization (EUA). This EUA will remain in effect (meaning this test can be used) for the duration of the COVID-19 declaration under Section 564(b)(1) of the Act, 21 U.S.C. section 360bbb-3(b)(1), unless the authorization is terminated or revoked.     Resp Syncytial Virus by PCR NEGATIVE NEGATIVE Final    Comment: (NOTE) Fact Sheet for Patients: BloggerCourse.com  Fact Sheet for Healthcare Providers: SeriousBroker.it  This test is not yet approved or cleared by the [place]  FDA and has been authorized for detection and/or diagnosis of SARS-CoV-2 by FDA under an Emergency Use Authorization (EUA). This EUA will remain in effect (meaning this test can be used) for the duration of the COVID-19  declaration under Section 564(b)(1) of the Act, 21 U.S.C. section 360bbb-3(b)(1), unless the authorization is terminated or revoked.  Performed at [facility], [address] [place], [place] [address]   Blood Culture (routine x 2)     Status: None (Preliminary result)   Collection Time: 02/23/24  8:20 PM   Specimen: BLOOD RIGHT ARM  Result Value Ref Range Status   Specimen Description BLOOD RIGHT ARM  Final   Special Requests   Final    BOTTLES DRAWN AEROBIC AND ANAEROBIC Blood Culture results may not be optimal due to an inadequate volume of blood received in culture bottles   Culture   Final    NO GROWTH < 12 HOURS Performed at [facility], [address] [place], [place] [address]    Report Status PENDING  Incomplete      Radiology Studies: CT FOOT LEFT WO CONTRAST Result Date: 02/24/2024 CLINICAL DATA:  Forefoot swelling, evaluate for possible osteomyelitis EXAM: CT OF THE LEFT FOOT WITHOUT CONTRAST TECHNIQUE: Multidetector CT imaging of the left foot was performed according to the standard protocol. Multiplanar CT image reconstructions were also generated. RADIATION DOSE REDUCTION: This exam was performed according to the departmental dose-optimization program which includes automated exposure control, adjustment of the mA and/or kV according to patient size and/or use of iterative reconstruction technique. COMPARISON:  Plain film from the previous day. FINDINGS: Bones/Joint/Cartilage Distal tibia and fibula show no evidence of acute fracture. Mottled areas of decreased attenuation are noted throughout the bony structures likely related to some disuse osteopenia. No acute fracture is seen. Degenerative changes in the tarsal bones are noted. Soft tissue wound is noted posterior to the calcaneus with some cortical irregularity suspicious for very early osteomyelitis. No other erosive changes are seen. Ligaments Suboptimally assessed by CT. Muscles and Tendons Surrounding  musculature appears within normal limits. Soft tissues Generalized soft tissue swelling is seen. Soft tissue wound is noted along the posterior lateral aspect of the calcaneus with some cortical irregularity consistent with early osteomyelitis. IMPRESSION: Changes consistent with early osteomyelitis in the posterolateral aspect of the calcaneus. Electronically Signed   By: Violeta Grey M.D.   On: 02/24/2024 02:53   DG Chest Port 1 View Result Date: 02/23/2024 CLINICAL DATA:  Possible sepsis EXAM: PORTABLE CHEST 1 VIEW COMPARISON:  01/13/2024 FINDINGS: Cardiac shadow is mildly enlarged but stable. Aortic calcifications are seen. Small bilateral pleural effusions are noted stable from the prior exam. Central vascular congestion is noted without significant edema. IMPRESSION: Changes of mild CHF. Electronically Signed   By: Violeta Grey M.D.   On: 02/23/2024 20:42   DG Foot 2 Views Left Result Date: 02/23/2024 CLINICAL DATA:  Possible sepsis. Question osteomyelitis of the left foot. EXAM: LEFT FOOT - 2 VIEW COMPARISON:  12/13/2023. FINDINGS: Osteopenia. Forefoot soft tissue swelling. No osseous erosions to suggest osteomyelitis. Mild degenerative changes of the first metatarsophalangeal joint. Calcifications along the plantar aspect of the foot. IMPRESSION: Forefoot soft tissue swelling.  No evidence of osteomyelitis. Electronically Signed   By: Shearon Denis M.D.   On: 02/23/2024 20:42       LOS: 1 day   Maylene Spear  [facility] Pager on [email or web address]  02/24/2024, 9:08 AM

## 2024-02-24 NOTE — Progress Notes (Signed)
 RT assisted with transport of this pt from 5W to 2H while manual bag ventilating pt.

## 2024-02-24 NOTE — Progress Notes (Signed)
 eLink Physician-Brief Progress Note Patient Name: Rasheena Trettin DOB: 03-12-1951 MRN: [id number]   Date of Service  02/24/2024  HPI/Events of Note  Hemoglobin 5.9 gm / dl. Lactic acid 2.0.  eICU Interventions  One unit PRBC ordered transfused. Lactic acid ordered for 5 a.m.        Talana Slatten U Tinnie Kunin 02/24/2024, 8:34 PM

## 2024-02-24 NOTE — Consult Note (Addendum)
 WOC Nurse Consult Note: Reason for Consult: Consult requested for left heel.  Pt is familiar to [facility] team from a recent admission. Daughter at the bedside is familiar with plan of care and states Pt was previously evaluated by the vascular team and they stated she is not a candidate for revascularization and she has been followed by [facility] as an outpatient for a chronic Unstageable pressure injury to the left heel. Primary team at the bedside to assess wound appearance. CT results indicate: "suspicious for very early osteomyelitis." This complex medical condition is beyond the scope of practice for WOC nurses; discussed results with primary team and  to please consult ortho or podiatry team for further plan of care if desired.  3X2cm, 90% soft moist eschar, 10% red with small amt tan drainage, no odor.  Pressure Injury POA: Yes Dressing procedure/placement/frequency:  Prevalon boot ordered to reduce pressure. Topical treatment orders provided for bedside nurses to perform as follows to assist with removal of nonviable tissue: Apply Medihoney to left heel Q day, then cover with foam dressing. Change foam dressing Q 3 days or PRN soiling Please re-consult if further assistance is needed.  Thank-you,  Wiliam Harder MSN, RN, CWOCN, [place], CNS [phone]

## 2024-02-24 NOTE — ED Notes (Signed)
Pt placed in bair hugger

## 2024-02-24 NOTE — Progress Notes (Signed)
 18:27 Epi 1MG  18:28 Ambu Bag CCM at Beside 18:29 Pads Placed 18:30 Intubation/ Anesthesia at bedside  18:39 25 Fent given

## 2024-02-24 NOTE — Progress Notes (Signed)
 eLink Physician-Brief Progress Note Patient Name: Kara Mullins DOB: 1951/02/09 MRN: [id number]   Date of Service  02/24/2024  HPI/Events of Note  73 yo ESRD-DD patient admitted with suspicion of evolving shock with MOSF and eventually intubated for altered mental status and acute respiratory failure. Work up is in progress.  eICU Interventions  New Patient Evaluation.        Shari Daughters Donella Pascarella 02/24/2024, 9:04 PM

## 2024-02-24 NOTE — Progress Notes (Signed)
 Chaplain responded to Code Blue finding two family members at the foot of the bed. Chaplain engaged in ministry of presence as daughter spoke of her belief that God has a plan and she is ready if it is His time to take her.  Chaplain prayed with daughter and her husband.    Chaplain escorted family to 2H family waiting area and announced their presence to staff.  Chaplain will continue to check on family throughout the next few hours.  Chaplain on standby.  Please call if patient expires.  Alexis Anger Chaplain

## 2024-02-24 NOTE — Significant Event (Signed)
 Rapid Response Event Note   Reason for Call :  Hypotension, hypoglycemia BP 67/43 CBG: 53  Initial Focused Assessment:  Pt lying in bed, oriented. No distress. Denies headache, lightheadedness or dizziness. Clear breath sounds. Abdomen soft. Skin warm, dry, pink.   VS: T 98.84F, BP 93/54, HR 77, RR 20, SpO2 100% on room air CBG: 158 (following below interventions)  Interventions:  -500cc IVF bolus, per MD -D50 for hypoglcemia, followed by D5% gtt, per MD   Plan of Care:    Event Summary:  MD Notified: Dr. Lyndon Santiago Call Time: 1619 Arrival Time: 1622 End Time: 1645  Washington Hacker, RN

## 2024-02-24 NOTE — Consult Note (Signed)
 Renal Service Consult Note [facility]  Kara Mullins 02/24/2024 Kara Sandifer, MD Requesting Physician: Dr. Lyndon Santiago  Reason for Consult: ESRD patient with  HPI: The patient is a 73 y.o. year-old w/ PMH as below who presented to ED 02/23/24 for "not doing well" at home. In ED HR 44, BP 108/62, temp 90.1 hypothermic. RA 100%. Labs showed creat 2.1, K 3.5, WBC 2.5, Hb 8.1. LA was 0.4. Plts low at 36K.  Patient was placed on Bair hugger with concern for possible sepsis she was started on empiric antibiotics.  Chest x-ray showed mild congestion.  Patient was following commands.  She has a chronic left heel wound and the x-ray of the foot showed soft tissue swelling with no bony involvement and a CT scan is pending.  Patient was admitted for hypothermia and possible sepsis.  Also bradycardia.  We are asked to see for dialysis.  Pt seen in hospital room with family at the bedside.  Daughter stated that patient was declining over the past past several days.  Also we did discuss the patient's failure to thrive and declining quality of life.  I told him that at some point she will be too weak to continue dialysis and will have to stop it but at this point the patient wants to keep dialyzing so we will keep dialyzing.    ROS - denies CP, no joint pain, no HA, no blurry vision, no rash, no diarrhea, no nausea/ vomiting  PMH: Chronic combined systolic/diastolic CHF ESRD on HD Anxiety disorder HL HTN History of PE  Past Surgical History  Past Surgical History:  Procedure Laterality Date   AV FISTULA PLACEMENT Left 03/27/2023   Procedure: LEFT ARM BRACHIOCEPHALIC ARTERIOVENOUS (AV) FISTULA CREATION;  Surgeon: Carlene Che, MD;  Location: [facility] OR;  Service: Vascular;  Laterality: Left;   CATARACT EXTRACTION, BILATERAL     combined systolic and diastolic congestive heart failure      INSERTION OF DIALYSIS CATHETER N/A 03/27/2023   Procedure: INSERTION OF RIGHT INTERNAL  JUGULAR TUNNELED DIALYSIS CATHETER;  Surgeon: Carlene Che, MD;  Location: [facility] OR;  Service: Vascular;  Laterality: N/A;   IR THORACENTESIS ASP PLEURAL SPACE W/IMG GUIDE  06/12/2023   Family History  Family History  Problem Relation Age of Onset   Throat cancer Father    Social History  reports that she has never smoked. She has never used smokeless tobacco. She reports that she does not currently use alcohol. She reports that she does not use drugs. Allergies  Allergies  Allergen Reactions   Crestor  [Rosuvastatin ] Shortness Of Breath   Nitrofuran Derivatives Nausea Only   Home medications Prior to Admission medications   Medication Sig Start Date End Date Taking? Authorizing Provider  apixaban  (ELIQUIS ) 2.5 MG TABS tablet Take 1 tablet (2.5 mg total) by mouth 2 (two) times daily. 01/17/24  Yes Elgergawy, Ardia Kraft, MD  Bismuth Tribromoph-Petrolatum (XEROFORM PETROLAT PATCH 2"X2" EX) Apply 1 Application topically daily. Cleanse L heel with Vashe wound cleanser do not rinse and allow to air dry. Apply Xeroform gauze to wound bed daily cover with dry gauze and Kerlix roll gauze. Place L foot in Prevalon boot to offload pressure   Yes [provider]  carvedilol  (COREG ) 3.125 MG tablet Please take 3.125 mg p.o. twice daily, but hold morning dose on dialysis days TTS AM. Patient taking differently: Take 3.125 mg by mouth See admin instructions. Take 1 tablet by mouth for HR equal or greater than  60 bpm, but hold morning dose on dialysis days TTS AM. 01/18/24  Yes Elgergawy, Dawood S, MD  leptospermum manuka honey (MEDIHONEY) gel Apply 1 Application topically daily. Cleanse sacrum and buttocks (intact skin and open wounds) with Vashe wound do not rinse and allow to air dry.  Apply Medihoney to wound beds daily, cover with dry gauze.  May cover area with silicone foam or ABD pad and tape.   Yes [provider]  lidocaine -prilocaine  (EMLA ) cream APPLY 1 APPLICATION TOPICALLY AS  NEEDED.(USE 30 MINUTES PRIOR TO DIALYSIS). 01/16/24  Yes [name], [name], NP  Menthol -Methyl Salicylate  (MUSCLE RUB) 10-15 % CREA Apply 1 Application topically daily as needed for muscle pain.   Yes [provider]  midodrine  (PROAMATINE ) 5 MG tablet Take as needed for systolic blood pressure less than 100, and take morning during dialysis days. 01/18/24  Yes Elgergawy, Ardia Kraft, MD  multivitamin (RENA-VIT) TABS tablet Take 1 tablet by mouth daily. Patient taking differently: Take 1 tablet by mouth See admin instructions. Take 1 tablet by mouth every Mon, Wed, Fri, Sat 06/19/23  Yes [name], [name], NP  OVER THE COUNTER MEDICATION Take 1 fluid ounce by mouth 3 (three) times a week. Liquid Cell Protein - given at dialysis   Yes [provider]  pantoprazole  (PROTONIX ) 40 MG tablet Take 1 tablet (40 mg total) by mouth daily. Patient taking differently: Take 40 mg by mouth See admin instructions. Take 1 tablet by mouth daily on non-dialysis days 01/17/24 02/22/25 Yes Elgergawy, Ardia Kraft, MD  fluticasone  (FLONASE ) 50 MCG/ACT nasal spray Place 2 sprays into both nostrils daily. Patient not taking: Reported on 02/23/2024 12/09/23   Kreg Pesa, [name]  mirtazapine  (REMERON ) 7.5 MG tablet Take 1 tablet (7.5 mg total) by mouth daily. Patient not taking: Reported on 02/23/2024 01/17/24   Elgergawy, Ardia Kraft, MD     Vitals:   02/24/24 1000 02/24/24 1110 02/24/24 1215 02/24/24 1335  BP: (!) 74/49 (!) 99/53 (!) 107/58   Pulse: 60 66 61   Resp: (!) 23 (!) 24 (!) 21   Temp:  97.8 F (36.6 C) (!) 94.4 F (34.7 C) (!) 94.5 F (34.7 C)  TempSrc:   Rectal Rectal  SpO2: 95% 95% 95%   Weight:      Height:       Exam Gen alert, frail elderly lady not in distress Lower extremities in boots No rash, cyanosis or gangrene Sclera anicteric, throat clear  No jvd or bruits Chest clear bilat to bases, no rales/ wheezing RRR no MRG Abd soft ntnd no mass or ascites +bs GU deferred MS no joint  effusions or deformity Ext no LE or UE edema, no other edema Neuro is alert, Ox 2 , nf, frail and deconditioned    LUE AVF+bruit     Renal-related home meds: Coreg  3.125 Mg twice daily on nondialysis days just take the evening dose on dialysis days Midodrine  10 mg as needed and also take 10 mg predialysis on dialysis days Rena-Vite 1 daily Others: PPI, Remeron , Eliquis     OP HD: [place] T[facility] Needs updating -> 3h  B350   45.5kg  3K bath  AVF  LUE   Heparin  4000    Assessment/ Plan: Hypothermia: Possible sepsis, getting empiric antibiotics.  Per PMD. Bradycardia: Possibly from hypothermia.  Per PMD. ESRD: on HD TTS.  Do not have staff to dialyze today, will plan dialysis tomorrow off schedule. Hold heparin  w/ low plts and now on eliquis .  BP: Blood pressure soft in the 90-100 range systolic.  No volume excess will keep even with dialysis. Volume: As above Anemia of esrd: Hb 7-9, follow, transfuse as needed Secondary hyperparathyroidism: Corrected calcium  in range, continue binders with meals Chronic left foot wound: CT pending to rule out infection Chronic pancytopenia: Platelets quite low H/o PE: from march 2025, on eliquis       Rob Kemaya Dorner  MD [facility] 02/24/2024, 4:01 PM  Recent Labs  Lab 02/23/24 1852 02/24/24 0549  HGB 8.1* 7.3*  ALBUMIN   --  2.6*  CALCIUM  8.4* 8.4*  CREATININE 2.14* 2.33*  K 3.5 4.4   Inpatient medications:  apixaban   2.5 mg Oral BID   cadexomer iodine   Topical Daily   feeding supplement  237 mL Oral TID BM   leptospermum manuka honey  1 Application Topical Daily   midodrine   10 mg Oral TID WC   [START ON 02/25/2024] multivitamin  1 tablet Oral Once per day on Monday Wednesday Friday Saturday   pantoprazole   40 mg Oral Daily   vancomycin  variable dose per unstable renal function (pharmacist dosing)   Does not apply See admin instructions    ceFEPime  (MAXIPIME ) IV

## 2024-02-24 NOTE — Consult Note (Signed)
 NAME:  Kara Mullins, MRN:  [id number], DOB:  01-21-1951, LOS: 1 ADMISSION DATE:  02/23/2024, CONSULTATION DATE:  02/24/2024 REFERRING MD: Maylene Spear, MD, CHIEF COMPLAINT:  hypotension    History of Present Illness:  A 73 yr old female with ESRD on HD on TTS (AVF), diagnosed PE (01/2024) on Eliquis , Anemia of chronic illness, chronic pancytopenia, chronic diastolic dysfunction (01/12/2024: Echo EF 60%, G1DD, RVSP 31.3 mmHg), PVD, nonhealing left heel ulcer, chronic hypotension since last admission (late March 2025), needed midodrine , PCM, deconditioned, bed bound/wheelchair bound, poor oral intake, and recently admitted to the hospital twice first time for pneumonia from influenza then following which patient also was admitted for pneumonia from aspiration discharged on 01/18/2024. She was brought to the ER after patient's daughter felt that patient was not doing well and was found to be bradycardic, hypothermic, hypotensive, and hypoglycemic. She has no specific complaints like cough, SOB, N/V/D, abd pain, CP, dysuria, or rash. She was admitted to the hospitalist's service and given IV steroids, NS 0.9% bolus 2 L total over 24 hrs, Vanco, Cefepime , D50 bolus, D5w drip, heating blanket, and Midodrine  10 mg tid. HD was not done due to hypotension. PCCM was consulted to evaluate. When I entered the room, she code blue was activated and HR was 31 sinus, SpO2 61%, agonal breathing, and given Epi 1 mg, intubated ETT 7.0 @23  cm at the gum line, and stat labs were sent. She did not lose pulse. Daughter at the bedside.   Pertinent  Medical History  ESRD on HD on TTS (AVF), diagnosed PE (01/2024) on Eliquis , Anemia of chronic illness, chronic pancytopenia, chronic diastolic dysfunction, PVD, nonhealing left heel ulcer, chronic hypotension since last admission (late March 2025), PCM, deconditioned, bed bound/wheelchair bound  Significant Hospital Events: Including procedures, antibiotic start and stop  dates in addition to other pertinent events   5/12: admission to hospitalist team 5/13: CODE BLUE was called, transferred to ICU   Interim History / Subjective:    Objective    Blood pressure (!) 64/52, pulse 81, temperature 97.9 F (36.6 C), temperature source Oral, resp. rate 18, height 5\' 5"  (1.651 m), weight 49.4 kg, SpO2 100%.    Vent Mode: PRVC FiO2 (%):  [100 %] 100 % Set Rate:  [18 bmp] 18 bmp Vt Set:  [460 mL] 460 mL PEEP:  [5 cmH20] 5 cmH20   Intake/Output Summary (Last 24 hours) at 02/24/2024 1911 Last data filed at 02/24/2024 0248 Gross per 24 hour  Intake 500 ml  Output --  Net 500 ml   Filed Weights   02/23/24 1847  Weight: 49.4 kg    Examination: General: lethargic. On 3 L [place]. SpO2 61%  HENT: PERL, normal pharynx and oral mucosa. No LNE or thyromegaly. + JVD Lungs: symmetrical air entry bilaterally. Basal crackles. No wheezing Cardiovascular: NL S1/S2. No m/g/r Abdomen: no distension or tenderness Extremities: trace edema. Symmetrical. Left heel ulcer Neuro: nonfocal. She was moving both upper and lower limbs and has facial symmetrical appearance Left AVF    Resolved problem list   Assessment and Plan  Severe bradycardia and hypoxia, code blue was activated, but did lose pulse. Admission with hypothermia and hypoglycemia D.dx: adrenal insufficiency, sepsis, severe malnutrition -Admit to ICU -MRSA screening -Continue Vanco and Cefepime  -f/u BCx -UA -Tracheal aspirate Cx -Repeat Echo -Trop, stat labs, LA -Stress steroids -Assess for adrenal insufficiency with appropriate   Acute resp failure in the setting of severe bradycardia -ABG and PCXR -VAP and PAD  protocols -Duoneb  Acute left heel OM and chronic nonhealing ulcer PVD: not candidate for vascular intervention per chart review -Seen by podiatry  -Abx as above  ESRD on HD on TTS (AVF) -Nephrology on the case -Serial labs -I/O chart -Renal U/S  PE (01/2024) -Continue  Eliquis   Chronic pancytopenia Anemia of chronic illness -Consider hematology eval -f/u CBC  Chronic diastolic dysfunction (01/12/2024: Echo EF 60%, G1DD, RVSP 31.3 mmHg) -Trop -Repeat Echo  Chronic hypotension since last admission (late March 2025) on Midodrine  -Continue Midodrine   Severe PCM, deconditioned, bed bound/wheelchair bound -Palliative care consult -TF/dietitian consult -Glycemic control  Anxiety -Sedation protocol -Resume home meds when appropriate   Best Practice (right click and "Reselect all SmartList Selections" daily)   Diet/type: NPO w/ oral meds DVT prophylaxis DOAC Pressure ulcer(s): present on admission  GI prophylaxis: PPI Lines: N/A Foley:  N/A Code Status:  full code Last date of multidisciplinary goals of care discussion []   Labs   CBC: Recent Labs  Lab 02/23/24 1852 02/23/24 2020 02/24/24 0549  WBC 2.5*  --  3.2*  NEUTROABS  --   --  2.5  HGB 8.1*  --  7.3*  HCT 27.2*  --  23.9*  MCV 95.8  --  93.4  PLT 37* 36* 41*    Basic Metabolic Panel: Recent Labs  Lab 02/23/24 1852 02/24/24 0549  NA 136 135  K 3.5 4.4  CL 99 100  CO2 31 29  GLUCOSE 54* 64*  BUN 15 18  CREATININE 2.14* 2.33*  CALCIUM  8.4* 8.4*   GFR: Estimated Creatinine Clearance: 17.3 mL/min (A) (by C-G formula based on SCr of 2.33 mg/dL (H)). Recent Labs  Lab 02/23/24 1852 02/23/24 2027 02/24/24 0549 02/24/24 1120  WBC 2.5*  --  3.2*  --   LATICACIDVEN  --  0.4*  --  1.1    Liver Function Tests: Recent Labs  Lab 02/24/24 0549  AST 49*  ALT 15  ALKPHOS 73  BILITOT 1.1  PROT 6.4*  ALBUMIN  2.6*   No results for input(s): "LIPASE", "AMYLASE" in the last 168 hours. No results for input(s): "AMMONIA" in the last 168 hours.  ABG    Component Value Date/Time   TCO2 24 06/02/2022 0337     Coagulation Profile: Recent Labs  Lab 02/23/24 2020  INR 1.2    Cardiac Enzymes: No results for input(s): "CKTOTAL", "CKMB", "CKMBINDEX", "TROPONINI" in  the last 168 hours.  HbA1C: Hgb A1c MFr Bld  Date/Time Value Ref Range Status  01/13/2024 05:03 AM 4.1 (L) 4.8 - 5.6 % Final    Comment:    (NOTE) Pre diabetes:          5.7%-6.4%  Diabetes:              >6.4%  Glycemic control for   <7.0% adults with diabetes     CBG: Recent Labs  Lab 02/24/24 1038 02/24/24 1424 02/24/24 1608 02/24/24 1632 02/24/24 1813  GLUCAP 122* 68* 53* 158* 75    Review of Systems:   Intubated   Past Medical History:  She,  has a past medical history of Chronic combined systolic and diastolic congestive heart failure (HCC) (11/09/2021), ESRD (end stage renal disease) on dialysis (HCC), Generalized anxiety disorder, Hyperlipidemia (11/09/2021), Hypertension, and PE (pulmonary thromboembolism) (HCC).   Surgical History:   Past Surgical History:  Procedure Laterality Date   AV FISTULA PLACEMENT Left 03/27/2023   Procedure: LEFT ARM BRACHIOCEPHALIC ARTERIOVENOUS (AV) FISTULA CREATION;  Surgeon: Carlene Che,  MD;  Location: [facility] OR;  Service: Vascular;  Laterality: Left;   CATARACT EXTRACTION, BILATERAL     combined systolic and diastolic congestive heart failure      INSERTION OF DIALYSIS CATHETER N/A 03/27/2023   Procedure: INSERTION OF RIGHT INTERNAL JUGULAR TUNNELED DIALYSIS CATHETER;  Surgeon: Carlene Che, MD;  Location: [facility] OR;  Service: Vascular;  Laterality: N/A;   IR THORACENTESIS ASP PLEURAL SPACE W/IMG GUIDE  06/12/2023     Social History:   reports that she has never smoked. She has never used smokeless tobacco. She reports that she does not currently use alcohol. She reports that she does not use drugs.   Family History:  Her family history includes Throat cancer in her father.   Allergies Allergies  Allergen Reactions   Crestor  [Rosuvastatin ] Shortness Of Breath   Nitrofuran Derivatives Nausea Only     Home Medications  Prior to Admission medications   Medication Sig Start Date End Date Taking? Authorizing Provider   apixaban  (ELIQUIS ) 2.5 MG TABS tablet Take 1 tablet (2.5 mg total) by mouth 2 (two) times daily. 01/17/24  Yes Elgergawy, Ardia Kraft, MD  Bismuth Tribromoph-Petrolatum (XEROFORM PETROLAT PATCH 2"X2" EX) Apply 1 Application topically daily. Cleanse L heel with Vashe wound cleanser do not rinse and allow to air dry. Apply Xeroform gauze to wound bed daily cover with dry gauze and Kerlix roll gauze. Place L foot in Prevalon boot to offload pressure   Yes [provider]  carvedilol  (COREG ) 3.125 MG tablet Please take 3.125 mg p.o. twice daily, but hold morning dose on dialysis days TTS AM. Patient taking differently: Take 3.125 mg by mouth See admin instructions. Take 1 tablet by mouth for HR equal or greater than 60 bpm, but hold morning dose on dialysis days TTS AM. 01/18/24  Yes Elgergawy, Dawood S, MD  leptospermum manuka honey (MEDIHONEY) gel Apply 1 Application topically daily. Cleanse sacrum and buttocks (intact skin and open wounds) with Vashe wound do not rinse and allow to air dry.  Apply Medihoney to wound beds daily, cover with dry gauze.  May cover area with silicone foam or ABD pad and tape.   Yes [provider]  lidocaine -prilocaine  (EMLA ) cream APPLY 1 APPLICATION TOPICALLY AS NEEDED.(USE 30 MINUTES PRIOR TO DIALYSIS). 01/16/24  Yes [name], [name], NP  Menthol -Methyl Salicylate  (MUSCLE RUB) 10-15 % CREA Apply 1 Application topically daily as needed for muscle pain.   Yes [provider]  midodrine  (PROAMATINE ) 5 MG tablet Take as needed for systolic blood pressure less than 100, and take morning during dialysis days. 01/18/24  Yes Elgergawy, Ardia Kraft, MD  multivitamin (RENA-VIT) TABS tablet Take 1 tablet by mouth daily. Patient taking differently: Take 1 tablet by mouth See admin instructions. Take 1 tablet by mouth every Mon, Wed, Fri, Sat 06/19/23  Yes [name], [name], NP  OVER THE COUNTER MEDICATION Take 1 fluid ounce by mouth 3 (three) times a week. Liquid Cell Protein  - given at dialysis   Yes [provider]  pantoprazole  (PROTONIX ) 40 MG tablet Take 1 tablet (40 mg total) by mouth daily. Patient taking differently: Take 40 mg by mouth See admin instructions. Take 1 tablet by mouth daily on non-dialysis days 01/17/24 02/22/25 Yes Elgergawy, Ardia Kraft, MD  fluticasone  (FLONASE ) 50 MCG/ACT nasal spray Place 2 sprays into both nostrils daily. Patient not taking: Reported on 02/23/2024 12/09/23   Kreg Pesa, [name]  mirtazapine  (REMERON ) 7.5 MG tablet Take 1 tablet (7.5 mg  total) by mouth daily. Patient not taking: Reported on 02/23/2024 01/17/24   Elgergawy, Ardia Kraft, MD     Critical care time: 60 min    Madelynn Schilder, MD [facility]

## 2024-02-24 NOTE — Anesthesia Procedure Notes (Signed)
 Procedure Name: Intubation Date/Time: 02/24/2024 6:32 PM  Performed by: Shauna Del, CRNAPre-anesthesia Checklist: Timeout performed, Patient identified, Emergency Drugs available, Suction available and Patient being monitored Patient Re-evaluated:Patient Re-evaluated prior to induction Oxygen Delivery Method: Ambu bag Preoxygenation: Pre-oxygenation with 100% oxygen Laryngoscope Size: Mac and 3 Grade View: Grade I Tube type: Oral Tube size: 7.0 mm Number of attempts: 1 Airway Equipment and Method: Stylet Placement Confirmation: breath sounds checked- equal and bilateral, CO2 detector, positive ETCO2 and ETT inserted through vocal cords under direct vision Secured at: 21 cm Tube secured with: Tape Dental Injury: Teeth and Oropharynx as per pre-operative assessment

## 2024-02-24 NOTE — Inpatient Diabetes Management (Signed)
 Inpatient Diabetes Program Recommendations  AACE/ADA: New Consensus Statement on Inpatient Glycemic Control (2015)  Target Ranges:  Prepandial:   less than 140 mg/dL      Peak postprandial:   less than 180 mg/dL (1-2 hours)      Critically ill patients:  140 - 180 mg/dL   Lab Results  Component Value Date   GLUCAP 129 (H) 01/15/2024   HGBA1C 4.1 (L) 01/13/2024    Review of Glycemic Control  Latest Reference Range & Units 02/23/24 18:52 02/24/24 05:49  Glucose 70 - 99 mg/dL 54 (L) 64 (L)   Diabetes history: none  Current orders for Inpatient glycemic control:  None  Inpatient Diabetes Program Recommendations:    -   consider obtaining CBGs and monitor for hypoglycemia  Thanks,  Eloise Hake RN, MSN, BC-ADM Inpatient Diabetes Coordinator Team Pager [phone] (8a-5p)

## 2024-02-24 NOTE — Progress Notes (Addendum)
 Pharmacy Antibiotic Note  Kara Mullins is a 73 y.o. female admitted on 02/23/2024 with bradycardia/AMS and now concerns for sepsis. PMH includes ESRD (HD), HTN, HLD, CHF. Patient also with recent hospitalization for Flu infection with superimposed bacterial pneumonia beginning of March  Pharmacy has been consulted for cefepime /vancomycin  dosing.  -1 set of blood cultures collected -CXR no signs of infection -Noted left heel ulcer with scan showing no signs of osteomyelitis  Plan: -Cefepime  1g IV every 24 hours -Vancomycin  1g IV x1 -Vancomycin  500mg  IV after every HD session -Follow up HD schedule while inpatient for further vancomycin  dosing -Follow up signs of clinical improvement, LOT, de-escalation of antibiotics   Height: 5\' 5"  (165.1 cm) Weight: 49.4 kg (109 lb) IBW/kg (Calculated) : 57  Temp (24hrs), Avg:94.4 F (34.7 C), Min:90.1 F (32.3 C), Max:98.7 F (37.1 C)  Recent Labs  Lab 02/23/24 1852 02/23/24 2027  WBC 2.5*  --   CREATININE 2.14*  --   LATICACIDVEN  --  0.4*    Estimated Creatinine Clearance: 18.8 mL/min (A) (by C-G formula based on SCr of 2.14 mg/dL (H)).    Allergies  Allergen Reactions   Crestor  [Rosuvastatin ] Shortness Of Breath   Nitrofuran Derivatives Nausea Only    Antimicrobials this admission: Cefepime  5/12 >>  Vancomycin  5/12 >>   Microbiology results: 5/12 BCx:   Thank you for allowing pharmacy to be a part of this patient's care.  Royston Cornea [facility] 02/24/2024 1:30 AM

## 2024-02-24 NOTE — Evaluation (Signed)
 Clinical/Bedside Swallow Evaluation Patient Details  Name: Kara Mullins MRN: [id number] Date of Birth: 1951/09/25  Today's Date: 02/24/2024 Time: SLP Start Time (ACUTE ONLY): 1437 SLP Stop Time (ACUTE ONLY): 1447 SLP Time Calculation (min) (ACUTE ONLY): 10 min  Past Medical History:  Past Medical History:  Diagnosis Date   Chronic combined systolic and diastolic congestive heart failure (HCC) 11/09/2021   ESRD (end stage renal disease) on dialysis (HCC)    Generalized anxiety disorder    Hyperlipidemia 11/09/2021   Hypertension    PE (pulmonary thromboembolism) (HCC)    Past Surgical History:  Past Surgical History:  Procedure Laterality Date   AV FISTULA PLACEMENT Left 03/27/2023   Procedure: LEFT ARM BRACHIOCEPHALIC ARTERIOVENOUS (AV) FISTULA CREATION;  Surgeon: Carlene Che, MD;  Location: [facility] OR;  Service: Vascular;  Laterality: Left;   CATARACT EXTRACTION, BILATERAL     combined systolic and diastolic congestive heart failure      INSERTION OF DIALYSIS CATHETER N/A 03/27/2023   Procedure: INSERTION OF RIGHT INTERNAL JUGULAR TUNNELED DIALYSIS CATHETER;  Surgeon: Carlene Che, MD;  Location: [facility] OR;  Service: Vascular;  Laterality: N/A;   IR THORACENTESIS ASP PLEURAL SPACE W/IMG GUIDE  06/12/2023   HPI:  Patient is a 73 y.o. female with several recent admissions, presented to the ED 5/12 with bradycardia, possible sepsis. PMH: ESRD on HD, heart failure, HTN, HLD. She was admitted 12/13/23 with AMS. MBS  12/15/23 showed grossly functional oropharyngeal swallow but patient's esophagus appeared to be filled with barium and with retrograde flow below the PES. Admitted 01/11/24 with dyspnea and FTT, pna.  Clinical swallow eval 01/13/24 with recs for regular diet, thin liquids - no s/s of an oropharyngeal dysphagia. She has had progressive weakness, very poor oral intake, progressively less mobility and worsening dyspnea on exertion.    Assessment / Plan / Recommendation  Clinical  Impression  Pt continues to present with adequate airway protection with no s/s of aspiration during the swallow. Of greater concern is findings on prior MBS of esophageal retention with retrograde flow noted below the PES.  Per prior notes, if pt does have intermittent aspiration occuring, it is likely post-prandial in nature. Today she is less alert, demonstrating bolus loss from left side of mouth and cues needed to participate.  With repositioning and cues to seal lips, she drank thin liquids and consumed purees without difficulty.  Anticipate improvements as MS clears.  D/W dtr and son-in-law at bedside. Continue current diet with focus on softer foods from regular tray; sit upright for meals and 30 min after. Consider esophagram and /or GI consult while admitted.  SLP will follow. SLP Visit Diagnosis: Dysphagia, unspecified (R13.10)    Aspiration Risk    unknown   Diet Recommendation   Age appropriate regular;Thin  Medication Administration: Whole meds with liquid    Other  Recommendations Recommended Consults: Consider esophageal assessment Oral Care Recommendations: Oral care BID    Recommendations for follow up therapy are one component of a multi-disciplinary discharge planning process, led by the attending physician.  Recommendations may be updated based on patient status, additional functional criteria and insurance authorization.  Follow up Recommendations Other (comment) (tba)      Assistance Recommended at Discharge    Functional Status Assessment Patient has had a recent decline in their functional status and demonstrates the ability to make significant improvements in function in a reasonable and predictable amount of time.  Frequency and Duration min 2x/week  1 week  Prognosis Prognosis for improved oropharyngeal function: Fair      Swallow Study   General Date of Onset: 01/11/24 HPI: Patient is a 73 y.o. female with several recent admissions, presented to the  ED 5/12 with bradycardia, possible sepsis. PMH: ESRD on HD, heart failure, HTN, HLD. She was admitted 12/13/23 with AMS. MBS  12/15/23 showed grossly functional oropharyngeal swallow but patient's esophagus appeared to be filled with barium and with retrograde flow below the PES. Admitted 01/11/24 with dyspnea and FTT, pna.  Clinical swallow eval 01/13/24 with recs for regular diet, thin liquids - no s/s of an oropharyngeal dysphagia. She has had progressive weakness, very poor oral intake, progressively less mobility and worsening dyspnea on exertion. Type of Study: Bedside Swallow Evaluation Previous Swallow Assessment: see HPI, MBS 12/15/23 Diet Prior to this Study: Regular;Thin liquids (Level 0) Temperature Spikes Noted:  (hypothermic) Respiratory Status: Nasal cannula History of Recent Intubation: No Behavior/Cognition: Alert;Confused Oral Cavity Assessment: Within Functional Limits Oral Care Completed by SLP: No Oral Cavity - Dentition: Edentulous Self-Feeding Abilities: Needs assist Patient Positioning: Upright in bed Baseline Vocal Quality: Normal Volitional Cough: Weak Volitional Swallow: Able to elicit    Oral/Motor/Sensory Function Overall Oral Motor/Sensory Function: Within functional limits   Ice Chips     Thin Liquid Thin Liquid: Impaired Presentation: Straw Oral Phase Impairments: Reduced labial seal Oral Phase Functional Implications: Left anterior spillage    Nectar Thick Nectar Thick Liquid: Not tested   Honey Thick Honey Thick Liquid: Not tested   Puree Puree: Within functional limits   Solid     Solid: Not tested      Kara Mullins 02/24/2024,3:01 PM  Kara Asa L. Beatris Lincoln, MA CCC/SLP Clinical Specialist - Acute Care SLP Acute Rehabilitation Services Office number [phone]

## 2024-02-24 NOTE — Consult Note (Signed)
 PODIATRY CONSULTATION  NAME Kara Mullins MRN [id number] DOB 03/19/1951 DOA 02/23/2024   Reason for consult: Left heel ulceration  Attending/Consulting physician: Houston Mace MD  History of present illness: "Kara Mullins is a 73 y.o. female with history of ESRD on hemodialysis on Tuesday Thursday Saturdays, recently diagnosed PE, chronic pancytopenia, chronic diastolic dysfunction, hypertension recently admitted to the hospital twice first time for pneumonia from influenza then following which patient also was admitted for pneumonia from aspiration discharged on 01/18/2024 was brought to the ER after patient's daughter felt that patient was not doing well and was found to be bradycardic.  Patient did not have any productive cough nausea vomiting or diarrhea.   Patient had recently followed up with vascular surgery for chronic nonhealing wound of the left heel and as per vascular surgery patient is a poor candidate for any intervention."  Discussed with patients daughter, wound appears overall stable at this time. Without blood flow will be difficult to heal and per her recent visit with vascular she is not a candidate for angio. She is aware of recommendation for wound care vs amputation. For now I agree best to proceed with ongoing wound care and attempt to offload the left heel at all times. Last seen 12/11/23 by Dr. Michalene Agee who referred her to vascular. She has not followed up since.   Past Medical History:  Diagnosis Date   Chronic combined systolic and diastolic congestive heart failure (HCC) 11/09/2021   ESRD (end stage renal disease) on dialysis [facility])    Generalized anxiety disorder    Hyperlipidemia 11/09/2021   Hypertension    PE (pulmonary thromboembolism) (HCC)        Latest Ref Rng & Units 02/24/2024    5:49 AM 02/23/2024    8:20 PM 02/23/2024    6:52 PM  CBC  WBC 4.0 - 10.5 K/uL 3.2   2.5   Hemoglobin 12.0 - 15.0 g/dL 7.3   8.1   Hematocrit 36.0 - 46.0 %  23.9   27.2   Platelets 150 - 400 K/uL 41  36  37        Latest Ref Rng & Units 02/24/2024    5:49 AM 02/23/2024    6:52 PM 01/17/2024    6:12 AM  BMP  Glucose 70 - 99 mg/dL 64  54  782   BUN 8 - 23 mg/dL 18  15  25    Creatinine 0.44 - 1.00 mg/dL 9.56  2.13  0.86   Sodium 135 - 145 mmol/L 135  136  134   Potassium 3.5 - 5.1 mmol/L 4.4  3.5  3.6   Chloride 98 - 111 mmol/L 100  99  98   CO2 22 - 32 mmol/L 29  31  28    Calcium  8.9 - 10.3 mg/dL 8.4  8.4  7.4       Physical Exam: Lower Extremity Exam Diminished DP and PT pulses LLE Edema BLE L posterolateral heel decubitus ulceration unstageable with eschar overlying, no significant maceration or drainage noted no malodor, mild erythema idstally Motor absent LLE     ASSESSMENT/PLAN OF CARE 73 y.o. female with PMHx significant for  ESRD on hemodialysis on Tuesday Thursday Saturdays, recently diagnosed PE, chronic pancytopenia, chronic diastolic dysfunction, hypertension  with left heel decubitus ulceration without evidence acute infection, possible early chronic OM in the underlying calc.   WBC 3.2 ESR/CRP: p CT L foot: Changes consistent with early osteomyelitis in the posterolateral aspect of the calcaneus.  -  No surgical plans, continue local wound care. Nonhealing wound 2/2 primarily uncontrolled pressure as pt is nonambulatory and secondarily has PAD limiting healing potential. Not candidate for revascularization per recent VVS note. - Offload left foot at all times with prevalon boot or floating heel off pillow.  - No significant infection noted clinically at the ulceration which overall appears stable.  - If worsening infectious process 2/2 left heel becomes concern then option will be for palliative wound care vs proximal amputation.  - Recommend broad spectrum po abx on dc for 2 weeks  - Anticoagulation: Per primary /vascular recs - Wound care: Change medi honey to iodosorb gel left heel Q day, then cover with foam  dressing. Change foam dressing Q 3 days or PRN soiling  - WB status: Non ambulatory at baseline - Will sign off please msg with questions    Thank you for the consult.  Please contact me directly with any questions or concerns.           Maridee Shoemaker, [name] [facility] / [facility]    [address] [place], [place] [address]                Office [phone]  Fax [phone]

## 2024-02-25 ENCOUNTER — Encounter (HOSPITAL_COMMUNITY): Payer: Medicare (Managed Care)

## 2024-02-25 DIAGNOSIS — N186 End stage renal disease: Secondary | ICD-10-CM

## 2024-02-25 DIAGNOSIS — Z7189 Other specified counseling: Secondary | ICD-10-CM | POA: Diagnosis not present

## 2024-02-25 DIAGNOSIS — A419 Sepsis, unspecified organism: Secondary | ICD-10-CM | POA: Diagnosis not present

## 2024-02-25 DIAGNOSIS — R6521 Severe sepsis with septic shock: Secondary | ICD-10-CM

## 2024-02-25 DIAGNOSIS — I5032 Chronic diastolic (congestive) heart failure: Secondary | ICD-10-CM

## 2024-02-25 DIAGNOSIS — R001 Bradycardia, unspecified: Secondary | ICD-10-CM | POA: Diagnosis not present

## 2024-02-25 DIAGNOSIS — L97429 Non-pressure chronic ulcer of left heel and midfoot with unspecified severity: Secondary | ICD-10-CM | POA: Diagnosis not present

## 2024-02-25 DIAGNOSIS — Z515 Encounter for palliative care: Secondary | ICD-10-CM | POA: Diagnosis not present

## 2024-02-25 DIAGNOSIS — R680 Hypothermia, not associated with low environmental temperature: Secondary | ICD-10-CM | POA: Diagnosis not present

## 2024-02-25 LAB — POCT I-STAT 7, (LYTES, BLD GAS, ICA,H+H)
Acid-Base Excess: 1 mmol/L (ref 0.0–2.0)
Bicarbonate: 26.4 mmol/L (ref 20.0–28.0)
Calcium, Ion: 1.15 mmol/L (ref 1.15–1.40)
HCT: 26 % — ABNORMAL LOW (ref 36.0–46.0)
Hemoglobin: 8.8 g/dL — ABNORMAL LOW (ref 12.0–15.0)
O2 Saturation: 99 %
Patient temperature: 36.4
Potassium: 3.7 mmol/L (ref 3.5–5.1)
Sodium: 135 mmol/L (ref 135–145)
TCO2: 28 mmol/L (ref 22–32)
pCO2 arterial: 44.6 mmHg (ref 32–48)
pH, Arterial: 7.378 (ref 7.35–7.45)
pO2, Arterial: 152 mmHg — ABNORMAL HIGH (ref 83–108)

## 2024-02-25 LAB — LACTIC ACID, PLASMA: Lactic Acid, Venous: 1.4 mmol/L (ref 0.5–1.9)

## 2024-02-25 LAB — GLUCOSE, CAPILLARY
Glucose-Capillary: 116 mg/dL — ABNORMAL HIGH (ref 70–99)
Glucose-Capillary: 89 mg/dL (ref 70–99)
Glucose-Capillary: 80 mg/dL (ref 70–99)
Glucose-Capillary: 98 mg/dL (ref 70–99)
Glucose-Capillary: 99 mg/dL (ref 70–99)
Glucose-Capillary: 100 mg/dL — ABNORMAL HIGH (ref 70–99)

## 2024-02-25 LAB — RENAL FUNCTION PANEL
Albumin: 2.2 g/dL — ABNORMAL LOW (ref 3.5–5.0)
Anion gap: 8 (ref 5–15)
BUN: 21 mg/dL (ref 8–23)
CO2: 26 mmol/L (ref 22–32)
Calcium: 8.2 mg/dL — ABNORMAL LOW (ref 8.9–10.3)
Chloride: 101 mmol/L (ref 98–111)
Creatinine, Ser: 2.63 mg/dL — ABNORMAL HIGH (ref 0.44–1.00)
GFR, Estimated: 19 mL/min — ABNORMAL LOW (ref >60)
Glucose, Bld: 102 mg/dL — ABNORMAL HIGH (ref 70–99)
Phosphorus: 2.9 mg/dL (ref 2.5–4.6)
Potassium: 3.6 mmol/L (ref 3.5–5.1)
Sodium: 135 mmol/L (ref 135–145)

## 2024-02-25 LAB — TYPE AND SCREEN
ABO/RH(D): AB POS
Antibody Screen: NEGATIVE
Unit division: 0

## 2024-02-25 LAB — CBC
HCT: 25.5 % — ABNORMAL LOW (ref 36.0–46.0)
Hemoglobin: 8.3 g/dL — ABNORMAL LOW (ref 12.0–15.0)
MCH: 29.4 pg (ref 26.0–34.0)
MCHC: 32.5 g/dL (ref 30.0–36.0)
MCV: 90.4 fL (ref 80.0–100.0)
Platelets: 39 10*3/uL — ABNORMAL LOW (ref 150–400)
RBC: 2.82 MIL/uL — ABNORMAL LOW (ref 3.87–5.11)
RDW: 17 % — ABNORMAL HIGH (ref 11.5–15.5)
WBC: 10.7 10*3/uL — ABNORMAL HIGH (ref 4.0–10.5)
nRBC: 0.8 % — ABNORMAL HIGH (ref 0.0–0.2)

## 2024-02-25 LAB — PROCALCITONIN: Procalcitonin: 0.74 ng/mL

## 2024-02-25 LAB — BPAM RBC
Blood Product Expiration Date: 202506112359
ISSUE DATE / TIME: 202505132311
Unit Type and Rh: 6200

## 2024-02-25 LAB — TRIGLYCERIDES: Triglycerides: 37 mg/dL (ref <150)

## 2024-02-25 MED ORDER — PENTAFLUOROPROP-TETRAFLUOROETH EX AERO: 1.0000 | INHALATION_SPRAY | CUTANEOUS | Status: DC | PRN

## 2024-02-25 MED ORDER — VITAL HIGH PROTEIN PO LIQD: 1000.0000 mL | ORAL | Status: DC

## 2024-02-25 MED ORDER — DEXTROSE 5 % IV SOLN: INTRAVENOUS | Status: DC

## 2024-02-25 MED ORDER — SODIUM CHLORIDE 0.9 % IV SOLN
3.0000 g | Freq: Every day | INTRAVENOUS | Status: DC
  Administered 2024-02-25 – 2024-02-26 (×2): 3 g via INTRAVENOUS
  Filled 2024-02-25 (×3): qty 8

## 2024-02-25 MED ORDER — NEPRO/CARBSTEADY PO LIQD: 237.0000 mL | ORAL | Status: DC | PRN

## 2024-02-25 MED ORDER — ANTICOAGULANT SODIUM CITRATE 4% (200MG/5ML) IV SOLN: 5.0000 mL | Status: DC | PRN

## 2024-02-25 MED ORDER — LIDOCAINE HCL (PF) 1 % IJ SOLN: 5.0000 mL | INTRAMUSCULAR | Status: DC | PRN

## 2024-02-25 MED ORDER — HEPARIN SODIUM (PORCINE) 1000 UNIT/ML DIALYSIS
1000.0000 [IU] | INTRAMUSCULAR | Status: DC | PRN
  Administered 2024-02-25: 2400 [IU]

## 2024-02-25 MED ORDER — VITAL 1.5 CAL PO LIQD
1000.0000 mL | ORAL | Status: DC
  Administered 2024-02-25: 1000 mL

## 2024-02-25 MED ORDER — HEPARIN SODIUM (PORCINE) 1000 UNIT/ML IJ SOLN
INTRAMUSCULAR | Status: AC
  Filled 2024-02-25: qty 4

## 2024-02-25 MED ORDER — LIDOCAINE-PRILOCAINE 2.5-2.5 % EX CREA: 1.0000 | TOPICAL_CREAM | CUTANEOUS | Status: DC | PRN

## 2024-02-25 MED ORDER — SODIUM CHLORIDE 0.9% FLUSH: 10.0000 mL | INTRAVENOUS | Status: DC | PRN

## 2024-02-25 MED ORDER — PANTOPRAZOLE SODIUM 40 MG IV SOLR
40.0000 mg | Freq: Every day | INTRAVENOUS | Status: DC
  Administered 2024-02-25 – 2024-02-26 (×2): 40 mg via INTRAVENOUS
  Filled 2024-02-25 (×2): qty 10

## 2024-02-25 MED ORDER — PIPERACILLIN-TAZOBACTAM IN DEX 2-0.25 GM/50ML IV SOLN
2.2500 g | Freq: Three times a day (TID) | INTRAVENOUS | Status: DC
  Filled 2024-02-25: qty 50

## 2024-02-25 MED ORDER — THIAMINE MONONITRATE 100 MG PO TABS
100.0000 mg | ORAL_TABLET | Freq: Every day | ORAL | Status: DC
  Administered 2024-02-25 – 2024-02-26 (×2): 100 mg
  Filled 2024-02-25: qty 1

## 2024-02-25 MED ORDER — ALTEPLASE 2 MG IJ SOLR: 2.0000 mg | Freq: Once | INTRAMUSCULAR | Status: DC | PRN

## 2024-02-25 MED ORDER — HYDROCORTISONE SOD SUC (PF) 100 MG IJ SOLR
100.0000 mg | Freq: Every day | INTRAMUSCULAR | Status: DC
Start: 2024-02-26 — End: 2024-02-26

## 2024-02-25 MED ORDER — PROSOURCE TF20 ENFIT COMPATIBL EN LIQD
60.0000 mL | Freq: Every day | ENTERAL | Status: DC
  Administered 2024-02-25 – 2024-02-26 (×2): 60 mL
  Filled 2024-02-25: qty 60

## 2024-02-25 MED ORDER — ORAL CARE MOUTH RINSE: 15.0000 mL | OROMUCOSAL | Status: DC | PRN

## 2024-02-25 MED ORDER — SODIUM CHLORIDE 0.9% FLUSH
10.0000 mL | Freq: Two times a day (BID) | INTRAVENOUS | Status: DC
  Administered 2024-02-25 (×2): 10 mL
  Administered 2024-02-25: 40 mL
  Administered 2024-02-26 – 2024-02-28 (×4): 10 mL

## 2024-02-25 MED ORDER — ORAL CARE MOUTH RINSE
15.0000 mL | OROMUCOSAL | Status: DC
  Administered 2024-02-25 – 2024-02-26 (×16): 15 mL via OROMUCOSAL

## 2024-02-25 NOTE — Progress Notes (Signed)
 NAME:  Kara Mullins, MRN:  [id number], DOB:  1951-02-21, LOS: 2 ADMISSION DATE:  02/23/2024, CONSULTATION DATE:  02/24/2024 REFERRING MD: Maylene Spear, MD, CHIEF COMPLAINT:  hypotension    History of Present Illness:  A 73 yr old female with ESRD on HD on TTS (AVF), diagnosed PE (01/2024) on Eliquis , Anemia of chronic illness, chronic pancytopenia, chronic diastolic dysfunction (01/12/2024: Echo EF 60%, G1DD, RVSP 31.3 mmHg), PVD, nonhealing left heel ulcer, chronic hypotension since last admission (late March 2025), needed midodrine , PCM, deconditioned, bed bound/wheelchair bound, poor oral intake, and recently admitted to the hospital twice first time for pneumonia from influenza then following which patient also was admitted for pneumonia from aspiration discharged on 01/18/2024. She was brought to the ER after patient's daughter felt that patient was not doing well and was found to be bradycardic, hypothermic, hypotensive, and hypoglycemic. She has no specific complaints like cough, SOB, N/V/D, abd pain, CP, dysuria, or rash. She was admitted to the hospitalist's service and given IV steroids, NS 0.9% bolus 2 L total over 24 hrs, Vanco, Cefepime , D50 bolus, D5w drip, heating blanket, and Midodrine  10 mg tid. HD was not done due to hypotension. PCCM was consulted to evaluate. When I entered the room, she code blue was activated and HR was 31 sinus, SpO2 61%, agonal breathing, and given Epi 1 mg, intubated ETT 7.0 @23  cm at the gum line, and stat labs were sent. She did not lose pulse. Daughter at the bedside.   Pertinent  Medical History  ESRD on HD on TTS (AVF), diagnosed PE (01/2024) on Eliquis , Anemia of chronic illness, chronic pancytopenia, chronic diastolic dysfunction, PVD, nonhealing left heel ulcer, chronic hypotension since last admission (late March 2025), PCM, deconditioned, bed bound/wheelchair bound  Significant Hospital Events: Including procedures, antibiotic start and stop  dates in addition to other pertinent events   5/12: admission to hospitalist team 5/13: Intubated on the floor due to hypotension, unresponsiveness, transferred to ICU   Interim History / Subjective:  Patient is off vasopressors Still hypoglycemic requiring D10 infusion Sedation was turned off this morning Remained afebrile  Objective    Blood pressure (!) 130/42, pulse 70, temperature 99 F (37.2 C), resp. rate (!) 22, height 5\' 5"  (1.651 m), weight 49.4 kg, SpO2 100%.    Vent Mode: PRVC FiO2 (%):  [40 %-100 %] 40 % Set Rate:  [18 bmp-22 bmp] 22 bmp Vt Set:  [340 mL-460 mL] 340 mL PEEP:  [5 cmH20] 5 cmH20 Plateau Pressure:  [33 cmH20-39 cmH20] 33 cmH20   Intake/Output Summary (Last 24 hours) at 02/25/2024 1037 Last data filed at 02/25/2024 0800 Gross per 24 hour  Intake 1079.5 ml  Output 150 ml  Net 929.5 ml   Filed Weights   02/23/24 1847  Weight: 49.4 kg    Examination: General: Acute on chronically ill-appearing female, orally intubated HEENT: [place]/AT, eyes anicteric.  ETT and OGT in place Neuro: Sedated, not following commands.  Eyes are closed.  Pupils 3 mm bilateral reactive to light.  Positive cough and gag Chest: Reduced air entry at the bases bilaterally, no wheezes or rhonchi Heart: Regular rate and rhythm, no murmurs or gallops Abdomen: Soft, nondistended, bowel sounds present  Labs and images reviewed  Resolved problem list  N/A  Assessment and Plan  Sepsis with septic shock due to aspiration pneumonia  Acute respiratory failure with hypoxia Probable relative adrenal insufficiency, leading to hypoglycemia, bradycardia End-stage renal disease on hemodialysis Subacute pulmonary emboli diagnosed after 2025  Chronic pancytopenia Chronic HFpEF Severe protein calorie malnutrition Chronic nonhealing ulcer on left heel, POA Recurrent hypoglycemia Acute encephalopathy in the setting of hypoglycemia and hypotension  Switch antibiotics to IV Unasyn  Stop  vancomycin  and cefepime  Continue lung protective ventilation FiO2 was titrated down to 40%, PEEP at 5 Did not tolerate a spontaneous breathing trial due to apnea VAP prevention bundle in place Follow-up respiratory culture Continue IV steroid TSH is normal with elevated T4 Nephrology is following for hemodialysis needs Hold Eliquis  for now, will decide about anticoagulation by tomorrow Platelet count remain at 39 Received 1 unit PRBC for hemoglobin of 5.9 Remain off vasopressors Monitor intake and output If she is not coming off of ventilator today, will start tube feed and dietary supplements Wound care is consulting Continue D10 infusion Monitor fingerstick every hour with goal 140-180  Best Practice (right click and "Reselect all SmartList Selections" daily)   Diet/type: NPO w/ oral meds DVT prophylaxis SCD Pressure ulcer(s): present on admission .  Please refer to nursing notes GI prophylaxis: PPI Lines: N/A Foley:  N/A Code Status:  full code Last date of multidisciplinary goals of care discussion [pending]  Labs   CBC: Recent Labs  Lab 02/23/24 1852 02/23/24 2020 02/24/24 0549 02/24/24 1838 02/24/24 2104 02/25/24 0506 02/25/24 0535  WBC 2.5*  --  3.2* 7.5  --   --  10.7*  NEUTROABS  --   --  2.5 3.0  --   --   --   HGB 8.1*  --  7.3* 5.9* 7.1* 8.8* 8.3*  HCT 27.2*  --  23.9* 20.0* 21.0* 26.0* 25.5*  MCV 95.8  --  93.4 96.2  --   --  90.4  PLT 37* 36* 41* 35*  --   --  39*    Basic Metabolic Panel: Recent Labs  Lab 02/23/24 1852 02/24/24 0549 02/24/24 1838 02/24/24 2104 02/25/24 0506 02/25/24 0535  NA 136 135 136 136 135 135  K 3.5 4.4 3.3* 3.3* 3.7 3.6  CL 99 100 105  --   --  101  CO2 31 29 23   --   --  26  GLUCOSE 54* 64* 175*  --   --  102*  BUN 15 18 18   --   --  21  CREATININE 2.14* 2.33* 2.36*  --   --  2.63*  CALCIUM  8.4* 8.4* 7.3*  --   --  8.2*  MG  --   --  1.8  --   --   --   PHOS  --   --  3.0  --   --  2.9   GFR: Estimated  Creatinine Clearance: 15.3 mL/min (A) (by C-G formula based on SCr of 2.63 mg/dL (H)). Recent Labs  Lab 02/23/24 1852 02/23/24 2027 02/24/24 0549 02/24/24 1120 02/24/24 1838 02/24/24 2031 02/25/24 0535  WBC 2.5*  --  3.2*  --  7.5  --  10.7*  LATICACIDVEN  --    < >  --  1.1 2.0* 2.9* 1.4   < > = values in this interval not displayed.    Liver Function Tests: Recent Labs  Lab 02/24/24 0549 02/25/24 0535  AST 49*  --   ALT 15  --   ALKPHOS 73  --   BILITOT 1.1  --   PROT 6.4*  --   ALBUMIN  2.6* 2.2*   No results for input(s): "LIPASE", "AMYLASE" in the last 168 hours. No results for input(s): "AMMONIA" in the last 168 hours.  ABG    Component Value Date/Time   PHART 7.378 02/25/2024 0506   PCO2ART 44.6 02/25/2024 0506   PO2ART 152 (H) 02/25/2024 0506   HCO3 26.4 02/25/2024 0506   TCO2 28 02/25/2024 0506   O2SAT 99 02/25/2024 0506     Coagulation Profile: Recent Labs  Lab 02/23/24 2020  INR 1.2    Cardiac Enzymes: No results for input(s): "CKTOTAL", "CKMB", "CKMBINDEX", "TROPONINI" in the last 168 hours.  HbA1C: Hgb A1c MFr Bld  Date/Time Value Ref Range Status  01/13/2024 05:03 AM 4.1 (L) 4.8 - 5.6 % Final    Comment:    (NOTE) Pre diabetes:          5.7%-6.4%  Diabetes:              >6.4%  Glycemic control for   <7.0% adults with diabetes     CBG: Recent Labs  Lab 02/24/24 1813 02/24/24 1917 02/24/24 2333 02/25/24 0408 02/25/24 0758  GLUCAP 75 137* 100* 116* 89    The patient is critically ill due to acute respiratory failure with hypoxia/recurrent hypoglycemia.  Critical care was necessary to treat or prevent imminent or life-threatening deterioration.  Critical care was time spent personally by me on the following activities: development of treatment plan with patient and/or surrogate as well as nursing, discussions with consultants, evaluation of patient's response to treatment, examination of patient, obtaining history from patient or  surrogate, ordering and performing treatments and interventions, ordering and review of laboratory studies, ordering and review of radiographic studies, pulse oximetry, re-evaluation of patient's condition and participation in multidisciplinary rounds.   During this encounter critical care time was devoted to patient care services described in this note for 43 minutes.     Trevor Fudge, MD [facility] See Amion for pager If no response to pager, please call [phone] until 7pm After 7pm, Please call E-link [phone]

## 2024-02-25 NOTE — Progress Notes (Signed)
 Initial Nutrition Assessment  DOCUMENTATION CODES:   Severe malnutrition in context of chronic illness  INTERVENTION:  Initiate tube feeding via OGT: Vital 1.5 at  48ml/h (1200 ml per day) Initiate at 36ml/hr and increase by 47ml/hr q10 hours  Provides 1800 kcal, 81 gm protein, 917 ml free water daily   Monitor magnesium, potassium, and phosphorus daily for at least 3 days, MD to replete as needed Add Thiamine  100 mg daily for 7 days  Continue Renal MVI w/ minerals  NUTRITION DIAGNOSIS:   Severe Malnutrition related to chronic illness (ESRD-HD, CHF exacerbated by PNA) as evidenced by severe fat depletion, severe muscle depletion.  GOAL:  Patient will meet greater than or equal to 90% of their needs  MONITOR:  TF tolerance, Diet advancement, Vent status, Labs, Skin, I & O's  REASON FOR ASSESSMENT:  Consult Assessment of nutrition requirement/status, Enteral/tube feeding initiation and management  ASSESSMENT:   Pt presenting to ED with PMH significant for: ESRD-HD, CHF, HTN, HLD, and recurrent UTIs. Complaints of AMS, hypotension and low heart rate for multiple days. Found w/ septic shock 2/2 aspiration PNA. Has had multiple admissions over last couple of months.  Previous Admission 3/01 admitted with flu A/PNA 3/04 discharged home 3/08 ED visit for dyspnea, CT chest: positive for PE Previous Admission 3/30 admitted; required pressors in ICU 4/01 transfer out of ICU to the floor 4/06 discharged  Current Admission 5/12 admitted 5/13 intubated on floor d/t hypotension, unresponsiveness, txr to ICU 5/14 failed SBT; TF initiated  Lives with daughter who is also her caregiver. Patient is wheelchair dependent. She was recently hospitalized from 3/1-3/4 for influenza A and bacterial PNA and again from 3/30 - 4/06 for dyspnea and failure to thrive. No family at bedside today during time of visit.   Patient is currently intubated on ventilator support MV: 7.4 L/min Temp  (24hrs), Avg:97.6 F (36.4 C), Min:78.3 F (25.7 C), Max:99.9 F (37.7 C)  She is off vasopressors and sedation this morning. Did require some fentanyl  this afternoon as she was trying to pull out her tube. Spoke with CCM who is amicable to starting and advancing tube feeds today via OGT.  Admit Weight: 49.4kg Current Weight: 49.4kg  Followed patient during previous admission. Was noted by daughter that patient has been with poor appetite/intake for some time as well as 70lbs weight loss over last year. This is 39% wt loss, which is considered clinically significant for the time frame. Per chart review, patient has shown 8.5% weight loss in last month, which is considered clinically significant for the time frame reviewed.   Drains/Lines: OGT (58cm -gastric) RIJ: non-tunneled  LUE AVG: +bruit  Low CBGs since admission requiring dextrose  infusion. A1c significantly low, which is suggestive of endorsed poor intake. She is at high risk for refeeding given nutrition exam and reported prolonged poor intake reported during last admission.   Meds: solu-cortef , renal MVI, pantoprazole , Miralax  Drips: IV ABX D5W @ 40ml/hr Fentanyl  @ 25mcg/hr Propofol  stopped on 5/14 @ 1031  Labs: Na+ 135 (wdl) K+ 3.3>3.7>3.6 (wdl) PHOS 2.9 (wdl) Hgb 5.9--->8.3 (L) CRP 6.4 (H) WBC 7.5>10.7 (H) CBGs 102-175 x24 hours A1c 4.1 (01/2024)  NUTRITION - FOCUSED PHYSICAL EXAM:  Flowsheet Row Most Recent Value  Orbital Region Severe depletion  Upper Arm Region Severe depletion  Thoracic and Lumbar Region Unable to assess  Buccal Region Unable to assess  Temple Region Severe depletion  Clavicle Bone Region Severe depletion  Clavicle and Acromion Bone Region Severe depletion  Scapular  Bone Region Severe depletion  Dorsal Hand Unable to assess  [mittens]  Patellar Region Unable to assess  Anterior Thigh Region Severe depletion  Posterior Calf Region Unable to assess  Edema (RD Assessment) Moderate  Hair  Reviewed  Eyes Unable to assess  Mouth Unable to assess  Skin Reviewed  Nails Unable to assess   Diet Order:   Diet Order             Diet NPO time specified  Diet effective now            EDUCATION NEEDS:  Not appropriate for education at this time  Skin:  Skin Assessment: Skin Integrity Issues: Skin Integrity Issues:: Stage II, Unstageable Stage II: sacrum, R buttocks Unstageable: L heel  Last BM:  PTA  Height:  Ht Readings from Last 1 Encounters:  02/24/24 5\' 5"  (1.651 m)   Weight:  Wt Readings from Last 1 Encounters:  02/25/24 49.4 kg   Ideal Body Weight:  56.8 kg  BMI:  Body mass index is 18.12 kg/m.  Estimated Nutritional Needs:   Kcal:  1600-1800kcals  Protein:  80-95g  Fluid:  1L + UOP  Con Decant MS, RD, LDN Registered Dietitian Clinical Nutrition RD Inpatient Contact Info in Amion

## 2024-02-25 NOTE — Progress Notes (Signed)
 RT attempted to obtain sputum sample without success. Will attempt again at next round

## 2024-02-25 NOTE — Procedures (Signed)
 Central Venous Catheter Insertion Procedure Note  Renita Vowell  [id number]  20-May-1951  Date:02/25/24  Time:3:42 AM   Provider Performing:Lakaya Tolen Melven Stable Alayne Hubert   Procedure: Insertion of Non-tunneled Central Venous Catheter(36556)with US  guidance (19147)    Indication(s) Medication administration, Difficult access, and Hemodialysis  Consent Unable to obtain consent due to emergent nature of procedure.  Anesthesia Topical only with 1% lidocaine    Timeout Verified patient identification, verified procedure, site/side was marked, verified correct patient position, special equipment/implants available, medications/allergies/relevant history reviewed, required imaging and test results available.  Sterile Technique Maximal sterile technique including full sterile barrier drape, hand hygiene, sterile gown, sterile gloves, mask, hair covering, sterile ultrasound probe cover (if used).  Procedure Description Area of catheter insertion was cleaned with chlorhexidine  and draped in sterile fashion.   With real-time ultrasound guidance a HD catheter was placed into the right internal jugular vein.  Nonpulsatile blood flow and easy flushing noted in all ports.  The catheter was sutured in place and sterile dressing applied.    Complications/Tolerance None; patient tolerated the procedure well. Chest X-ray is ordered to verify placement for internal jugular or subclavian cannulation.  Chest x-ray is not ordered for femoral cannulation.  EBL Minimal  Specimen(s) None  [facility] [place], [place] [facility] 02/25/24 3:43 AM  Please see [email or web address] for pager details.  From 7A-7P if no response, please call [phone] After hours, please call ELink [phone]

## 2024-02-25 NOTE — Consult Note (Signed)
 Palliative Medicine Inpatient Consult Note  Consulting Provider: Gokul, Krishnan  Reason for consult:   Palliative Care Consult Services Palliative Medicine Consult  Reason for Consult? GOC   02/25/2024  HPI:  Per intake H&P -->  A 73 yr old female with ESRD on HD on TTS (AVF), diagnosed PE (01/2024) on Eliquis , Anemia of chronic illness, chronic pancytopenia, chronic diastolic dysfunction (01/12/2024: Echo EF 60%, G1DD, RVSP 31.3 mmHg), PVD, nonhealing left heel ulcer, chronic hypotension since last admission (late March 2025), needed midodrine , PCM, deconditioned, bed bound/wheelchair bound, poor oral intake, and recently admitted to the hospital twice first time for pneumonia from influenza then following which patient also was admitted for pneumonia from aspiration discharged on 01/18/2024.   The PMT knows Kara Mullins well on 03/25/23 she agreed with Dr. Drexel Gentles to be a DNAR/DNI though on 06/13/23 this was rescinded.   The Palliative care team is involved to further facilitate goals of care conversations in the setting of severe septic shock..  Clinical Assessment/Goals of Care:  *Please note that this is a verbal dictation therefore any spelling or grammatical errors are due to the "Dragon Medical One" system interpretation.  I have reviewed medical records including EPIC notes, labs and imaging, received report from bedside RN, assessed the patient who is lying in bed, intubated and on CRRT with mittens on.    I met with patients daughter, Kara Mullins to further discuss diagnosis prognosis, GOC, EOL wishes, disposition and options.   I introduced Palliative Medicine as specialized medical care for people living with serious illness. It focuses on providing relief from the symptoms and stress of a serious illness. The goal is to improve quality of life for both the patient and the family.  Medical History Review and Understanding:  Past medical history reviewed inclusive of  ESRD on iHD,  hypertension, hyperlipidemia, PVD, generalized anxiety, and combined systolic and diastolic heart failure.   Social History:  Kara Mullins lives in a single-family home with her daughter Kara Mullins and her daughters spouse, Kara Mullins.  She is divorced.  She had 2 children, though one of her daughters passed away 4 years ago.  She has 2 adult grandchildren. She worked in [employer] for many years ([employer] for 39+ years).  Kara Mullins is a woman of extreme faith and practices within Christianity. Kara Mullins gets enjoyment out of being in the car with her daughter and face-timing with her daughter while she walks around stores.   Functional and Nutritional State:  Kara Mullins is bed bound/wheelchair bound at baseline - she has been this way for many years. She is able to stand, pivot. She has had significant weight loss over the last year (greater than 50lbs).   Advance Directives:  A detailed discussion was had today regarding advanced directives.  Kara Mullins created advanced Directive in April.  Code Status:  Concepts specific to code status, artifical feeding and hydration, continued IV antibiotics and rehospitalization was had.  The difference between a aggressive medical intervention path  and a palliative comfort care path for this patient at this time was had.   Kara Mullins understands her mothers illness and she has wanted to honor her wishes. She does not want to compress on Kara Mullins's chest cavity if she goes into cardiac arrest as it would cause pain and suffering which she would like to avoid.   Discussion:  Kara Mullins has a good understanding of her mothers chronic disease burden and the significance of this on her overall health.   Kara Mullins shares that  her mother had been doing fairly up until her hospitalization in March. She had recovered some and then declined further after Aprils hospitalization. Kara Mullins acknowledges that Kara Mullins has lost a fair amount of weight - this was accelerated by her  started hemodialysis.  Since last discharge Kara Mullins has been at her ex-husband home as she preferred this environment per Kara Mullins. She recently got to attend a family members wedding which was a goal of the patient. Kara Mullins had made some comments to Kara Mullins indicating an unspoken knowledge of her potential for health decline.   We reviewed the patient present health and interventions being utilized to sustain life inclusive of mechanical ventilatory support and CRRT. Patients daughter shares that she would like to continue the present measures though not indefinitely. We discussed that once Kara Mullins is extubated her daughter does not want her to be re-intubated.  We reviewed the hope for improvement and the reality that Kara Mullins may decline further. If she were to decline further Kara Mullins would not want her in distress or to suffer - she would opt if this appeared to be the reality to make her mother comfort care.   Discussed the importance of continued conversation with family and their  medical providers regarding overall plan of care and treatment options, ensuring decisions are within the context of the patients values and GOCs.  Decision Maker: Kara Mullins, Kara Mullins (Daughter): [phone] (Mobile)   SUMMARY OF RECOMMENDATIONS   DNR - No chest compressions though may intubate, use advanced airway interventions and cardioversion/ACLS medications if appropriate or indicated. May transfer to ICU.   Open and honest conversations held with patients daughter about her disease burden and MSOD  Patients daughter would like to allow a few days to see if Kara Mullins can improve - if she is not able to do so then Kara Mullins would want to transition the focus to her comfort  If patient is extubated the plan would be to see how she does though if she were to develop distress then Kara Mullins would want to focus on comfort and no re-intubation  Plan at this time is to allow time for outcomes  Ongoing PMT support  Code Status/Advance  Care Planning: DNR - No chest compressions though may intubate, use advanced airway interventions and cardioversion/ACLS medications if appropriate or indicated. May transfer to ICU.   Palliative Prophylaxis:  Aspiration, Bowel Regimen, Delirium Protocol, Frequent Pain Assessment, Oral Care, Palliative Wound Care, and Turn Reposition  Additional Recommendations (Limitations, Scope, Preferences): Continue current care  Psycho-social/Spiritual:  Desire for further Chaplaincy support: Yes Additional Recommendations: Education on chronic disease burden   Prognosis: Limited overall in the setting of critical illness.   Discharge Planning: Discharge plan to be determined.   Vitals:   02/25/24 1445 02/25/24 1500  BP: 129/76 124/76  Pulse: 74 72  Resp: 19 15  Temp:    SpO2: 100% 100%    Intake/Output Summary (Last 24 hours) at 02/25/2024 1510 Last data filed at 02/25/2024 1400 Gross per 24 hour  Intake 1344.96 ml  Output 150 ml  Net 1194.96 ml   Last Weight  Most recent update: 02/25/2024  1:49 PM    Weight  49.4 kg (108 lb 14.5 oz)            Gen:  Critically ill appearing elderly caucasian F HEENT: ETT, OGT, dry mucous membranes CV: Regular rate and rhythm  PULM: On mechanical ventilator  ABD: soft/nontender  EXT: No edema  Neuro: Opens eyes and responds  PPS: 10%   This  conversation/these recommendations were discussed with patient primary care team, Dr. Zaida Mullins ______________________________________________________ [place] [place] [facility] Palliative Medicine Team Team Cell Phone: [phone] Please utilize secure chat with additional questions, if there is no response within 30 minutes please call the above phone number  Total Time: 95 Billing based on MDM: High

## 2024-02-25 NOTE — Progress Notes (Signed)
 eLink Physician-Brief Progress Note Patient Name: Kara Mullins DOB: 1950-11-21 MRN: [id number]   Date of Service  02/25/2024  HPI/Events of Note  Patient has oozing around Trialysis catheter, surgicel has been ordered.  eICU Interventions  Hold tonight's dose of Elquis, daytime PCCM attending physician to re-evaluate order in the morning.        Vedansh Kerstetter U Khalessi Blough 02/25/2024, 12:17 AM

## 2024-02-25 NOTE — Progress Notes (Signed)
 SLP Cancellation Note  Patient Details Name: Tyianna Shamblin MRN: [id number] DOB: 08/27/1951   Cancelled treatment: Pt intubated 5/13. SLP will sign off.  Bernestine Holsapple L. Beatris Lincoln, MA CCC/SLP Clinical Specialist - Acute Care SLP Acute Rehabilitation Services Office number [phone]          Myna Asal Laurice 02/25/2024, 9:42 AM

## 2024-02-25 NOTE — Progress Notes (Signed)
   02/25/24 1708  Vitals  Temp 97.9 F (36.6 C)  Pulse Rate 75  Resp (!) 22  BP 138/75  SpO2 95 %  O2 Device Ventilator  Weight 49.4 kg  Type of Weight Post-Dialysis  Oxygen Therapy  FiO2 (%) 40 %  Post Treatment  Dialyzer Clearance Lightly streaked  Hemodialysis Intake (mL) 0 mL  Liters Processed 63  Fluid Removed (mL) 0 mL  Tolerated HD Treatment Yes   Tx completed at pt bedside---2H17.  Alert---intubated Informed consent signed and in chart.   TX duration:3  Patient tolerated well.  Alert, without acute distress.  Hand-off given to patient's nurse.   Access used: RIJ trialysis Access issues: no complications  Total UF removed: ran even per dx order Medication(s) given: none   [facility]

## 2024-02-25 NOTE — Progress Notes (Signed)
 [facility] Progress Note  Subjective:  Seen in ICU Pt on vent, sedated  Vitals:   02/25/24 1350 02/25/24 1400 02/25/24 1415 02/25/24 1430  BP: (!) 155/61 111/65 120/65 (!) 144/59  Pulse: 74 73 71 72  Resp: (!) 26 20 (!) 22 17  Temp: (!) 97.5 F (36.4 C) (!) 97.5 F (36.4 C) (!) 97.3 F (36.3 C)   TempSrc:      SpO2: 100% 99% 98% 100%  Weight:      Height:        Exam: Gen on vent, sedated Sclera anicteric, throat w/ ETT No jvd or bruits Chest clear anterior/ lateral RRR no MRG Abd soft ntnd no mass or ascites +bs Ext no LE edema Neuro is on vent, sedated    LUE AVF+bruit       Renal-related home meds: Coreg  3.125 Mg twice daily on nondialysis days just take the evening dose on dialysis days Midodrine  10 mg as needed and also take 10 mg predialysis on dialysis days Rena-Vite 1 daily Others: PPI, Remeron , Eliquis       [facility] HD: [facility] 3h  B350   46.1kg  3K bath  L AVF  Heparin  4000       Assessment/ Plan: Sepsis / shock: due to aspiration PNA. Getting vent support and IV abx per CCM. On IV unasyn , IV steroids.  Acute hypoxic resp failure: on vent, per pmd AMS: Probable adrenal insufficiency: causing low BS, low HR.  ESRD: on HD TTS. HD today off schedule. Next HD Friday.   BP: bp's labile, low to normal, no pressors  Volume: no vol excess, up 2-3 kg by wts Anemia of esrd: Hb 7-9, follow, transfuse as needed Secondary hyperparathyroidism: Corrected calcium  in range, continue binders with meals Chronic left foot wound Chronic pancytopenia H/o PE: from march 2025, on eliquis         Rob Makinsey Pepitone MD  [facility] 02/25/2024, 2:41 PM  Recent Labs  Lab 02/24/24 0549 02/24/24 1838 02/24/24 2104 02/25/24 0506 02/25/24 0535  HGB 7.3* 5.9*   < > 8.8* 8.3*  ALBUMIN  2.6*  --   --   --  2.2*  CALCIUM  8.4* 7.3*  --   --  8.2*  PHOS  --  3.0  --   --  2.9  CREATININE 2.33* 2.36*  --   --  2.63*  K 4.4 3.3*   < > 3.7 3.6   < > = values in this  interval not displayed.   No results for input(s): "IRON ", "TIBC", "FERRITIN" in the last 168 hours. Inpatient medications:  cadexomer iodine   Topical Daily   Chlorhexidine  Gluconate Cloth  6 each Topical Q0600   [START ON 02/26/2024] hydrocortisone  sod succinate (SOLU-CORTEF ) inj  100 mg Intravenous Daily   ipratropium-albuterol   3 mL Nebulization QID   leptospermum manuka honey  1 Application Topical Daily   midodrine   10 mg Per Tube TID WC   multivitamin  1 tablet Per Tube Once per day on Monday Wednesday Friday Saturday   mouth rinse  15 mL Mouth Rinse Q2H   pantoprazole  (PROTONIX ) IV  40 mg Intravenous QHS   polyethylene glycol  17 g Per Tube Daily   sodium chloride  flush  10-40 mL Intracatheter Q12H    ampicillin -sulbactam (UNASYN ) IV     anticoagulant sodium citrate      dextrose  40 mL/hr at 02/25/24 1400   fentaNYL  infusion INTRAVENOUS 25 mcg/hr (02/25/24 0822)   propofol  (DIPRIVAN ) infusion Stopped (02/25/24 1031)  alteplase , anticoagulant sodium citrate , feeding supplement (NEPRO CARB STEADY), fentaNYL , heparin , lidocaine  (PF), lidocaine -prilocaine , mouth rinse, pentafluoroprop-tetrafluoroeth, polyethylene glycol, sodium chloride  flush

## 2024-02-26 ENCOUNTER — Encounter (HOSPITAL_COMMUNITY): Payer: Medicare (Managed Care)

## 2024-02-26 ENCOUNTER — Encounter: Payer: Medicare (Managed Care) | Admitting: Physical Therapy

## 2024-02-26 DIAGNOSIS — A419 Sepsis, unspecified organism: Secondary | ICD-10-CM | POA: Diagnosis not present

## 2024-02-26 DIAGNOSIS — R001 Bradycardia, unspecified: Secondary | ICD-10-CM | POA: Diagnosis not present

## 2024-02-26 DIAGNOSIS — R680 Hypothermia, not associated with low environmental temperature: Secondary | ICD-10-CM | POA: Diagnosis not present

## 2024-02-26 DIAGNOSIS — L97429 Non-pressure chronic ulcer of left heel and midfoot with unspecified severity: Secondary | ICD-10-CM | POA: Diagnosis not present

## 2024-02-26 LAB — GLUCOSE, CAPILLARY
Glucose-Capillary: 103 mg/dL — ABNORMAL HIGH (ref 70–99)
Glucose-Capillary: 111 mg/dL — ABNORMAL HIGH (ref 70–99)
Glucose-Capillary: 47 mg/dL — ABNORMAL LOW (ref 70–99)
Glucose-Capillary: 59 mg/dL — ABNORMAL LOW (ref 70–99)
Glucose-Capillary: 64 mg/dL — ABNORMAL LOW (ref 70–99)
Glucose-Capillary: 66 mg/dL — ABNORMAL LOW (ref 70–99)
Glucose-Capillary: 67 mg/dL — ABNORMAL LOW (ref 70–99)
Glucose-Capillary: 72 mg/dL (ref 70–99)
Glucose-Capillary: 74 mg/dL (ref 70–99)
Glucose-Capillary: 75 mg/dL (ref 70–99)
Glucose-Capillary: 78 mg/dL (ref 70–99)
Glucose-Capillary: 78 mg/dL (ref 70–99)
Glucose-Capillary: 83 mg/dL (ref 70–99)
Glucose-Capillary: 83 mg/dL (ref 70–99)
Glucose-Capillary: 84 mg/dL (ref 70–99)
Glucose-Capillary: 96 mg/dL (ref 70–99)

## 2024-02-26 LAB — CBC WITH DIFFERENTIAL/PLATELET
Abs Immature Granulocytes: 0.04 10*3/uL (ref 0.00–0.07)
Basophils Absolute: 0 10*3/uL (ref 0.0–0.1)
Basophils Relative: 0 %
Eosinophils Absolute: 0 10*3/uL (ref 0.0–0.5)
Eosinophils Relative: 0 %
HCT: 24.5 % — ABNORMAL LOW (ref 36.0–46.0)
Hemoglobin: 8 g/dL — ABNORMAL LOW (ref 12.0–15.0)
Immature Granulocytes: 1 %
Lymphocytes Relative: 7 %
Lymphs Abs: 0.6 10*3/uL — ABNORMAL LOW (ref 0.7–4.0)
MCH: 29.2 pg (ref 26.0–34.0)
MCHC: 32.7 g/dL (ref 30.0–36.0)
MCV: 89.4 fL (ref 80.0–100.0)
Monocytes Absolute: 0.5 10*3/uL (ref 0.1–1.0)
Monocytes Relative: 6 %
Neutro Abs: 7.3 10*3/uL (ref 1.7–7.7)
Neutrophils Relative %: 86 %
Platelets: 49 10*3/uL — ABNORMAL LOW (ref 150–400)
RBC: 2.74 MIL/uL — ABNORMAL LOW (ref 3.87–5.11)
RDW: 17.2 % — ABNORMAL HIGH (ref 11.5–15.5)
WBC: 8.4 10*3/uL (ref 4.0–10.5)
nRBC: 0.8 % — ABNORMAL HIGH (ref 0.0–0.2)

## 2024-02-26 LAB — BASIC METABOLIC PANEL WITH GFR
Anion gap: 7 (ref 5–15)
BUN: 13 mg/dL (ref 8–23)
CO2: 28 mmol/L (ref 22–32)
Calcium: 7.6 mg/dL — ABNORMAL LOW (ref 8.9–10.3)
Chloride: 96 mmol/L — ABNORMAL LOW (ref 98–111)
Creatinine, Ser: 1.78 mg/dL — ABNORMAL HIGH (ref 0.44–1.00)
GFR, Estimated: 30 mL/min — ABNORMAL LOW (ref >60)
Glucose, Bld: 68 mg/dL — ABNORMAL LOW (ref 70–99)
Potassium: 3.6 mmol/L (ref 3.5–5.1)
Sodium: 131 mmol/L — ABNORMAL LOW (ref 135–145)

## 2024-02-26 LAB — MAGNESIUM: Magnesium: 1.7 mg/dL (ref 1.7–2.4)

## 2024-02-26 LAB — PHOSPHORUS: Phosphorus: 2.2 mg/dL — ABNORMAL LOW (ref 2.5–4.6)

## 2024-02-26 MED ORDER — CHLORHEXIDINE GLUCONATE CLOTH 2 % EX PADS
6.0000 | MEDICATED_PAD | Freq: Every day | CUTANEOUS | Status: DC
  Administered 2024-02-29: 6 via TOPICAL

## 2024-02-26 MED ORDER — POLYETHYLENE GLYCOL 3350 17 G PO PACK
17.0000 g | PACK | Freq: Every day | ORAL | Status: DC
  Administered 2024-02-29: 17 g via ORAL
  Filled 2024-02-26 (×3): qty 1

## 2024-02-26 MED ORDER — DEXTROSE 50 % IV SOLN
12.5000 g | Freq: Once | INTRAVENOUS | Status: AC
  Administered 2024-02-26: 12.5 g via INTRAVENOUS
  Filled 2024-02-26: qty 50

## 2024-02-26 MED ORDER — DEXTROSE 10 % IV SOLN
INTRAVENOUS | Status: DC
  Filled 2024-02-26: qty 1000

## 2024-02-26 MED ORDER — FENTANYL 2500MCG IN NS 250ML (10MCG/ML) PREMIX INFUSION: 25.0000 ug/h | INTRAVENOUS | Status: DC

## 2024-02-26 MED ORDER — ORAL CARE MOUTH RINSE
15.0000 mL | OROMUCOSAL | Status: DC
  Administered 2024-02-26 – 2024-02-28 (×8): 15 mL via OROMUCOSAL

## 2024-02-26 MED ORDER — POLYETHYLENE GLYCOL 3350 17 G PO PACK: 17.0000 g | PACK | Freq: Every day | ORAL | Status: DC | PRN

## 2024-02-26 MED ORDER — ORAL CARE MOUTH RINSE: 15.0000 mL | OROMUCOSAL | Status: DC | PRN

## 2024-02-26 MED ORDER — IPRATROPIUM-ALBUTEROL 0.5-2.5 (3) MG/3ML IN SOLN: 3.0000 mL | RESPIRATORY_TRACT | Status: DC | PRN

## 2024-02-26 NOTE — Plan of Care (Signed)

## 2024-02-26 NOTE — Progress Notes (Signed)
 eLink Physician-Brief Progress Note Patient Name: Kara Mullins DOB: 25-Aug-1951 MRN: [id number]   Date of Service  02/26/2024  HPI/Events of Note  Blood glucose 66 mg / dl.  eICU Interventions  D 10 % water gtt increased to 75 ml / hour.        Jordyn Doane U Alexzandra Bilton 02/26/2024, 9:19 PM

## 2024-02-26 NOTE — Procedures (Signed)
 Extubation Procedure Note  Patient Details:   Name: Kara Mullins DOB: 02-18-1951 MRN: [id number]   Airway Documentation:    Vent end date: 02/26/24 Vent end time: 1045   Evaluation  O2 sats: stable throughout Complications: No apparent complications Patient did tolerate procedure well. Bilateral Breath Sounds: Rhonchi, Diminished   Yes  Pt extubated to 3L [place], pt tolerated well. Cuff leak present, no stridor noted, RN at bedside, CCM MD aware.   Elton Ham 02/26/2024, 10:52 AM

## 2024-02-26 NOTE — Progress Notes (Signed)
 Pt not tolerating SBT at this time due to low Min ventilation and low VT despite increase of PSV. Pt placed back on full vent support,pt tolerating well at this time. RN aware, CCM MD aware.

## 2024-02-26 NOTE — Progress Notes (Signed)
 Pt receives out-pt HD at [facility] [place] on TTS 6:30 am chair time. Will assist as needed.   Lauraine Polite Renal Navigator [phone]

## 2024-02-26 NOTE — Progress Notes (Signed)
 This chaplain responded to PMT NP-Michelle referral for a spiritual care check in with the Pt. and Pt. daughter-Terra. The chaplain is appreciative of RN-Hannah's update before the visit.  The Pt. is resting comfortably at the time of the visit. Leslee Rase shares her efforts to research her employer's policy on [employer]. The chaplain offered Palliative support if documentation is needed.    The chaplain understands from [place] the Pt. Was able to acknowledge community clergy's visit today. Leslee Rase accepted the chaplain's invitation for F/U spiritual care and intercessory prayer.  Chaplain Kathleene Papas [phone]

## 2024-02-26 NOTE — Progress Notes (Signed)
 NAME:  Eliot Bertolucci, MRN:  [id number], DOB:  October 04, 1951, LOS: 3 ADMISSION DATE:  02/23/2024, CONSULTATION DATE:  02/24/2024 REFERRING MD: Maylene Spear, MD, CHIEF COMPLAINT:  hypotension    History of Present Illness:  A 73 yr old female with ESRD on HD on TTS (AVF), diagnosed PE (01/2024) on Eliquis , Anemia of chronic illness, chronic pancytopenia, chronic diastolic dysfunction (01/12/2024: Echo EF 60%, G1DD, RVSP 31.3 mmHg), PVD, nonhealing left heel ulcer, chronic hypotension since last admission (late March 2025), needed midodrine , PCM, deconditioned, bed bound/wheelchair bound, poor oral intake, and recently admitted to the hospital twice first time for pneumonia from influenza then following which patient also was admitted for pneumonia from aspiration discharged on 01/18/2024. She was brought to the ER after patient's daughter felt that patient was not doing well and was found to be bradycardic, hypothermic, hypotensive, and hypoglycemic. She has no specific complaints like cough, SOB, N/V/D, abd pain, CP, dysuria, or rash. She was admitted to the hospitalist's service and given IV steroids, NS 0.9% bolus 2 L total over 24 hrs, Vanco, Cefepime , D50 bolus, D5w drip, heating blanket, and Midodrine  10 mg tid. HD was not done due to hypotension. PCCM was consulted to evaluate. When I entered the room, she code blue was activated and HR was 31 sinus, SpO2 61%, agonal breathing, and given Epi 1 mg, intubated ETT 7.0 @23  cm at the gum line, and stat labs were sent. She did not lose pulse. Daughter at the bedside.   Pertinent  Medical History  ESRD on HD on TTS (AVF), diagnosed PE (01/2024) on Eliquis , Anemia of chronic illness, chronic pancytopenia, chronic diastolic dysfunction, PVD, nonhealing left heel ulcer, chronic hypotension since last admission (late March 2025), PCM, deconditioned, bed bound/wheelchair bound  Significant Hospital Events: Including procedures, antibiotic start and stop  dates in addition to other pertinent events   5/12: admission to hospitalist team 5/13: Intubated on the floor due to hypotension, unresponsiveness, transferred to ICU  5/14 off pressors.  Received hemodialysis   Interim History / Subjective:  Patient became hypoglycemic overnight requiring D50 half ampule, despite being on D5 and tube feeds Now she is awake, tolerating spontaneous breathing trial Received hemodialysis yesterday  Objective    Blood pressure (!) 102/59, pulse 71, temperature 97.6 F (36.4 C), temperature source Axillary, resp. rate (!) 22, height 5\' 5"  (1.651 m), weight 55.5 kg, SpO2 96%.    Vent Mode: PRVC FiO2 (%):  [30 %-40 %] 30 % Set Rate:  [22 bmp] 22 bmp Vt Set:  [340 mL] 340 mL PEEP:  [5 cmH20] 5 cmH20 Plateau Pressure:  [26 cmH20-34 cmH20] 27 cmH20   Intake/Output Summary (Last 24 hours) at 02/26/2024 0901 Last data filed at 02/26/2024 0800 Gross per 24 hour  Intake 1246.03 ml  Output 0 ml  Net 1246.03 ml   Filed Weights   02/25/24 1330 02/25/24 1708 02/26/24 0500  Weight: 49.4 kg 49.4 kg 55.5 kg    Examination: General: Acute on ill-appearing female, orally intubated HEENT: [place]/AT, eyes anicteric.  ETT and cortrak in place Neuro: Awake following commands, moving all 4 extremities Chest: Coarse breath sounds, no wheezes or rhonchi Heart: Regular rate and rhythm, no murmurs or gallops Abdomen: Soft, nondistended, bowel sounds present  Labs and images reviewed  Resolved problem list  N/A  Assessment and Plan  Sepsis with septic shock due to aspiration pneumonia  Acute respiratory failure with hypoxia Probable relative adrenal insufficiency, leading to hypoglycemia, bradycardia End-stage renal disease on hemodialysis  Subacute pulmonary emboli diagnosed after 2025 Chronic pancytopenia Chronic HFpEF Severe protein calorie malnutrition Chronic nonhealing ulcer on left heel, POA Recurrent hypoglycemia Acute encephalopathy in the setting of  hypoglycemia and hypotension  Shock is resolved Patient is off pressors Continue IV Unasyn  Follow-up cultures Tolerating spontaneous breathing trial, will try to extubate her Stop steroids Received hemodialysis yesterday Switch D5-D10 Continue tube feeds Patient is off anticoagulation in the setting of thrombocytopenia Watch for signs of bleeding Monitor intake and output Continue dietary supplements Continue wound care Mental status has improved she is awake, following commands   Best Practice (right click and "Reselect all SmartList Selections" daily)   Diet/type: Tube feeds DVT prophylaxis SCD Pressure ulcer(s): present on admission .  Please refer to nursing notes GI prophylaxis: PPI Lines: N/A Foley:  N/A Code Status:  DNR Last date of multidisciplinary goals of care discussion: 5/14, palliative care met with family, they decided to keep her DNR, proceed with one-way extubation  Labs   CBC: Recent Labs  Lab 02/23/24 1852 02/23/24 2020 02/24/24 0549 02/24/24 1838 02/24/24 2104 02/25/24 0506 02/25/24 0535 02/26/24 0448  WBC 2.5*  --  3.2* 7.5  --   --  10.7* 8.4  NEUTROABS  --   --  2.5 3.0  --   --   --  7.3  HGB 8.1*  --  7.3* 5.9* 7.1* 8.8* 8.3* 8.0*  HCT 27.2*  --  23.9* 20.0* 21.0* 26.0* 25.5* 24.5*  MCV 95.8  --  93.4 96.2  --   --  90.4 89.4  PLT 37* 36* 41* 35*  --   --  39* 49*    Basic Metabolic Panel: Recent Labs  Lab 02/23/24 1852 02/24/24 0549 02/24/24 1838 02/24/24 2104 02/25/24 0506 02/25/24 0535 02/26/24 0448  NA 136 135 136 136 135 135 131*  K 3.5 4.4 3.3* 3.3* 3.7 3.6 3.6  CL 99 100 105  --   --  101 96*  CO2 31 29 23   --   --  26 28  GLUCOSE 54* 64* 175*  --   --  102* 68*  BUN 15 18 18   --   --  21 13  CREATININE 2.14* 2.33* 2.36*  --   --  2.63* 1.78*  CALCIUM  8.4* 8.4* 7.3*  --   --  8.2* 7.6*  MG  --   --  1.8  --   --   --  1.7  PHOS  --   --  3.0  --   --  2.9 2.2*   GFR: Estimated Creatinine Clearance: 25.4 mL/min  (A) (by C-G formula based on SCr of 1.78 mg/dL (H)). Recent Labs  Lab 02/24/24 0549 02/24/24 1120 02/24/24 1838 02/24/24 2031 02/25/24 0534 02/25/24 0535 02/26/24 0448  PROCALCITON  --   --   --   --  0.74  --   --   WBC 3.2*  --  7.5  --   --  10.7* 8.4  LATICACIDVEN  --  1.1 2.0* 2.9*  --  1.4  --     Liver Function Tests: Recent Labs  Lab 02/24/24 0549 02/25/24 0535  AST 49*  --   ALT 15  --   ALKPHOS 73  --   BILITOT 1.1  --   PROT 6.4*  --   ALBUMIN  2.6* 2.2*   No results for input(s): "LIPASE", "AMYLASE" in the last 168 hours. No results for input(s): "AMMONIA" in the last 168 hours.  ABG  Component Value Date/Time   PHART 7.378 02/25/2024 0506   PCO2ART 44.6 02/25/2024 0506   PO2ART 152 (H) 02/25/2024 0506   HCO3 26.4 02/25/2024 0506   TCO2 28 02/25/2024 0506   O2SAT 99 02/25/2024 0506     Coagulation Profile: Recent Labs  Lab 02/23/24 2020  INR 1.2    Cardiac Enzymes: No results for input(s): "CKTOTAL", "CKMB", "CKMBINDEX", "TROPONINI" in the last 168 hours.  HbA1C: Hgb A1c MFr Bld  Date/Time Value Ref Range Status  01/13/2024 05:03 AM 4.1 (L) 4.8 - 5.6 % Final    Comment:    (NOTE) Pre diabetes:          5.7%-6.4%  Diabetes:              >6.4%  Glycemic control for   <7.0% adults with diabetes     CBG: Recent Labs  Lab 02/26/24 0234 02/26/24 0448 02/26/24 0624 02/26/24 0648 02/26/24 0803  GLUCAP 78 74 67* 111* 83    The patient is critically ill due to acute respiratory failure with hypoxia/recurrent hypoglycemia.  Critical care was necessary to treat or prevent imminent or life-threatening deterioration.  Critical care was time spent personally by me on the following activities: development of treatment plan with patient and/or surrogate as well as nursing, discussions with consultants, evaluation of patient's response to treatment, examination of patient, obtaining history from patient or surrogate, ordering and performing  treatments and interventions, ordering and review of laboratory studies, ordering and review of radiographic studies, pulse oximetry, re-evaluation of patient's condition and participation in multidisciplinary rounds.   During this encounter critical care time was devoted to patient care services described in this note for 35 minutes.     Trevor Fudge, MD [facility] See Amion for pager If no response to pager, please call [phone] until 7pm After 7pm, Please call E-link [phone]

## 2024-02-26 NOTE — Progress Notes (Signed)
 [facility] Progress Note  Subjective:  Extubated this morning Off pressors  Vitals:   02/26/24 1145 02/26/24 1200 02/26/24 1215 02/26/24 1230  BP: 128/72 124/73 122/74 135/83  Pulse: 73 72 72 77  Resp: 13 12 12 14   Temp:      TempSrc:      SpO2: 100% 100% 100% 100%  Weight:      Height:        Exam: Gen on vent, sedated Sclera anicteric, throat w/ ETT No jvd or bruits Chest clear anterior/ lateral RRR no MRG Abd soft ntnd no mass or ascites +bs Ext no LE edema Neuro is on vent, sedated    LUE AVF+bruit       Renal-related home meds: Coreg  3.125 Mg twice daily on nondialysis days just take the evening dose on dialysis days Midodrine  10 mg as needed and also take 10 mg predialysis on dialysis days Rena-Vite 1 daily Others: PPI, Remeron , Eliquis       [facility] HD: [facility] 3h  B350   46.1kg  3K bath  L AVF  Heparin  4000       Assessment/ Plan: Sepsis / shock/ aspiration PNA: getting IV unasyn , IV steroids  Acute hypoxic resp failure: weaned down, extubated this am Prob adrenal insufficiency: causing low BS, bradycardia, on IV steroids ESRD: on HD TTS. Had HD Wed off schedule. Next HD Friday.  BP: bp's stable today, 120/80 range.  Volume: no vol excess, up 2-3 kg by wts. UF 2.5 w/ next HD Anemia of esrd: Hb 7-9, follow, transfuse as needed Secondary hyperparathyroidism: Corrected calcium  in range, continue binders with meals Chronic left foot wound Chronic pancytopenia H/o PE: from march 2025, on eliquis         Rob Marcianne Ozbun MD  [facility] 02/26/2024, 12:44 PM  Recent Labs  Lab 02/24/24 0549 02/24/24 1838 02/25/24 0535 02/26/24 0448  HGB 7.3*   < > 8.3* 8.0*  ALBUMIN  2.6*  --  2.2*  --   CALCIUM  8.4*   < > 8.2* 7.6*  PHOS  --    < > 2.9 2.2*  CREATININE 2.33*   < > 2.63* 1.78*  K 4.4   < > 3.6 3.6   < > = values in this interval not displayed.   No results for input(s): "IRON ", "TIBC", "FERRITIN" in the last 168 hours. Inpatient  medications:  cadexomer iodine   Topical Daily   Chlorhexidine  Gluconate Cloth  6 each Topical Q0600   feeding supplement (PROSource TF20)  60 mL Per Tube Daily   leptospermum manuka honey  1 Application Topical Daily   midodrine   10 mg Per Tube TID WC   multivitamin  1 tablet Per Tube Once per day on Monday Wednesday Friday Saturday   mouth rinse  15 mL Mouth Rinse 4 times per day   pantoprazole  (PROTONIX ) IV  40 mg Intravenous QHS   polyethylene glycol  17 g Per Tube Daily   sodium chloride  flush  10-40 mL Intracatheter Q12H   thiamine   100 mg Per Tube Daily    ampicillin -sulbactam (UNASYN ) IV Stopped (02/25/24 2343)   anticoagulant sodium citrate      dextrose  40 mL/hr at 02/26/24 1200   feeding supplement (VITAL 1.5 CAL) Stopped (02/26/24 1030)   alteplase , anticoagulant sodium citrate , feeding supplement (NEPRO CARB STEADY), heparin , ipratropium-albuterol , lidocaine  (PF), lidocaine -prilocaine , mouth rinse, pentafluoroprop-tetrafluoroeth, polyethylene glycol, sodium chloride  flush

## 2024-02-26 NOTE — Progress Notes (Signed)
 Hypoglycemic Event  CBG: 67  Treatment: D50 25 mL (12.5 gm)  Symptoms: None  Follow-up CBG: [id number] CBG Result:111  Possible Reasons for Event: Unknown  Comments/MD notified: E link RN    Letty Raya

## 2024-02-27 ENCOUNTER — Encounter (HOSPITAL_COMMUNITY): Payer: Medicare (Managed Care)

## 2024-02-27 DIAGNOSIS — R001 Bradycardia, unspecified: Secondary | ICD-10-CM | POA: Diagnosis not present

## 2024-02-27 DIAGNOSIS — A419 Sepsis, unspecified organism: Secondary | ICD-10-CM | POA: Diagnosis not present

## 2024-02-27 DIAGNOSIS — L97429 Non-pressure chronic ulcer of left heel and midfoot with unspecified severity: Secondary | ICD-10-CM | POA: Diagnosis not present

## 2024-02-27 DIAGNOSIS — R680 Hypothermia, not associated with low environmental temperature: Secondary | ICD-10-CM | POA: Diagnosis not present

## 2024-02-27 LAB — CBC WITH DIFFERENTIAL/PLATELET
Abs Immature Granulocytes: 0.05 10*3/uL (ref 0.00–0.07)
Basophils Absolute: 0 10*3/uL (ref 0.0–0.1)
Basophils Relative: 0 %
Eosinophils Absolute: 0 10*3/uL (ref 0.0–0.5)
Eosinophils Relative: 0 %
HCT: 26.8 % — ABNORMAL LOW (ref 36.0–46.0)
Hemoglobin: 8.5 g/dL — ABNORMAL LOW (ref 12.0–15.0)
Immature Granulocytes: 1 %
Lymphocytes Relative: 5 %
Lymphs Abs: 0.4 10*3/uL — ABNORMAL LOW (ref 0.7–4.0)
MCH: 28.9 pg (ref 26.0–34.0)
MCHC: 31.7 g/dL (ref 30.0–36.0)
MCV: 91.2 fL (ref 80.0–100.0)
Monocytes Absolute: 0.4 10*3/uL (ref 0.1–1.0)
Monocytes Relative: 4 %
Neutro Abs: 8.2 10*3/uL — ABNORMAL HIGH (ref 1.7–7.7)
Neutrophils Relative %: 90 %
Platelets: 62 10*3/uL — ABNORMAL LOW (ref 150–400)
RBC: 2.94 MIL/uL — ABNORMAL LOW (ref 3.87–5.11)
RDW: 17.5 % — ABNORMAL HIGH (ref 11.5–15.5)
WBC: 9.1 10*3/uL (ref 4.0–10.5)
nRBC: 0.6 % — ABNORMAL HIGH (ref 0.0–0.2)

## 2024-02-27 LAB — GLUCOSE, CAPILLARY
Glucose-Capillary: 100 mg/dL — ABNORMAL HIGH (ref 70–99)
Glucose-Capillary: 107 mg/dL — ABNORMAL HIGH (ref 70–99)
Glucose-Capillary: 71 mg/dL (ref 70–99)
Glucose-Capillary: 76 mg/dL (ref 70–99)
Glucose-Capillary: 77 mg/dL (ref 70–99)
Glucose-Capillary: 83 mg/dL (ref 70–99)
Glucose-Capillary: 96 mg/dL (ref 70–99)
Glucose-Capillary: 96 mg/dL (ref 70–99)

## 2024-02-27 LAB — BASIC METABOLIC PANEL WITH GFR
Anion gap: 5 (ref 5–15)
BUN: 23 mg/dL (ref 8–23)
CO2: 29 mmol/L (ref 22–32)
Calcium: 7.5 mg/dL — ABNORMAL LOW (ref 8.9–10.3)
Chloride: 94 mmol/L — ABNORMAL LOW (ref 98–111)
Creatinine, Ser: 2.13 mg/dL — ABNORMAL HIGH (ref 0.44–1.00)
GFR, Estimated: 24 mL/min — ABNORMAL LOW (ref >60)
Glucose, Bld: 84 mg/dL (ref 70–99)
Potassium: 3.7 mmol/L (ref 3.5–5.1)
Sodium: 128 mmol/L — ABNORMAL LOW (ref 135–145)

## 2024-02-27 LAB — MAGNESIUM: Magnesium: 1.8 mg/dL (ref 1.7–2.4)

## 2024-02-27 LAB — PHOSPHORUS: Phosphorus: 3.1 mg/dL (ref 2.5–4.6)

## 2024-02-27 LAB — HEPATITIS B SURFACE ANTIBODY, QUANTITATIVE: Hep B S AB Quant (Post): 13.3 m[IU]/mL

## 2024-02-27 MED ORDER — LIDOCAINE-PRILOCAINE 2.5-2.5 % EX CREA: 1.0000 | TOPICAL_CREAM | CUTANEOUS | Status: DC | PRN

## 2024-02-27 MED ORDER — LIDOCAINE HCL (PF) 1 % IJ SOLN: 5.0000 mL | INTRAMUSCULAR | Status: DC | PRN

## 2024-02-27 MED ORDER — BOOST / RESOURCE BREEZE PO LIQD CUSTOM
1.0000 | Freq: Three times a day (TID) | ORAL | Status: DC
  Administered 2024-02-27 – 2024-02-28 (×2): 1 via ORAL

## 2024-02-27 MED ORDER — RENA-VITE PO TABS
1.0000 | ORAL_TABLET | ORAL | Status: DC
  Administered 2024-02-27 – 2024-02-28 (×2): 1 via ORAL
  Filled 2024-02-27 (×2): qty 1

## 2024-02-27 MED ORDER — NEPRO/CARBSTEADY PO LIQD: 237.0000 mL | ORAL | Status: DC | PRN

## 2024-02-27 MED ORDER — PENTAFLUOROPROP-TETRAFLUOROETH EX AERO: 1.0000 | INHALATION_SPRAY | CUTANEOUS | Status: DC | PRN

## 2024-02-27 MED ORDER — THIAMINE MONONITRATE 100 MG PO TABS
100.0000 mg | ORAL_TABLET | Freq: Every day | ORAL | Status: AC
  Administered 2024-02-27 – 2024-02-29 (×3): 100 mg via ORAL
  Filled 2024-02-27 (×3): qty 1

## 2024-02-27 MED ORDER — HEPARIN SODIUM (PORCINE) 1000 UNIT/ML DIALYSIS
3000.0000 [IU] | Freq: Once | INTRAMUSCULAR | Status: AC
  Administered 2024-02-27: 3000 [IU] via INTRAVENOUS_CENTRAL

## 2024-02-27 MED ORDER — HEPARIN SODIUM (PORCINE) 1000 UNIT/ML DIALYSIS: 1500.0000 [IU] | INTRAMUSCULAR | Status: DC | PRN

## 2024-02-27 MED ORDER — MIDODRINE HCL 5 MG PO TABS
10.0000 mg | ORAL_TABLET | Freq: Three times a day (TID) | ORAL | Status: DC
  Administered 2024-02-27 – 2024-02-29 (×5): 10 mg via ORAL
  Filled 2024-02-27 (×5): qty 2

## 2024-02-27 MED ORDER — PANTOPRAZOLE SODIUM 40 MG PO TBEC
40.0000 mg | DELAYED_RELEASE_TABLET | Freq: Every day | ORAL | Status: DC
  Administered 2024-02-27 – 2024-02-28 (×2): 40 mg via ORAL
  Filled 2024-02-27 (×2): qty 1

## 2024-02-27 MED ORDER — HEPARIN SODIUM (PORCINE) 1000 UNIT/ML IJ SOLN
INTRAMUSCULAR | Status: AC
  Filled 2024-02-27: qty 9

## 2024-02-27 MED ORDER — HEPARIN SODIUM (PORCINE) 1000 UNIT/ML DIALYSIS
1000.0000 [IU] | INTRAMUSCULAR | Status: DC | PRN
  Administered 2024-02-27: 2400 [IU]

## 2024-02-27 MED ORDER — ANTICOAGULANT SODIUM CITRATE 4% (200MG/5ML) IV SOLN: 5.0000 mL | Status: DC | PRN

## 2024-02-27 NOTE — TOC Initial Note (Signed)
 Transition of Care [facility]) - Initial/Assessment Note    Patient Details  Name: Kara Mullins MRN: [id number] Date of Birth: [birth year]  [facility]) CM/SW Contact:    Cosimo Diones, RN Phone Number: 02/27/2024, 4:44 PM  Clinical Narrative:  Patient transferred from 2 H. PTA patient was from home with daughter and her spouse. Patient has DME bed and wheelchair. Case Manager spoke with daughter regarding PT/OT recommendations for [facility]- daughter wants to review the Medicare.gov list and then make a decision. Weekend to discuss with family and get choice.                  Expected Discharge Plan: Home w Home Health Services Barriers to Discharge: Continued Medical Work up   Patient Goals and CMS Choice Patient states their goals for this hospitalization and ongoing recovery are:: daughter wants patient to return home   Choice offered to / list presented to : Patient, Adult Children      Expected Discharge Plan and Services In-house Referral: NA Discharge Planning Services: CM Consult Post Acute Care Choice: Home Health Living arrangements for the past 2 months: Single Family Home                                      Prior Living Arrangements/Services Living arrangements for the past 2 months: Single Family Home Lives with:: Adult Children Patient language and need for interpreter reviewed:: Yes        Need for Family Participation in Patient Care: Yes (Comment) Care giver support system in place?: Yes (comment)   Criminal Activity/Legal Involvement Pertinent to Current Situation/Hospitalization: No - Comment as needed  Activities of Daily Living      Permission Sought/Granted Permission sought to share information with : Case Manager, Family Supports                Emotional Assessment Appearance:: Appears stated age Attitude/Demeanor/Rapport: Unable to Assess Affect (typically observed): Unable to Assess Orientation: :  Oriented to Self Alcohol / Substance Use: Not Applicable Psych Involvement: No (comment)  Admission diagnosis:  Bradycardia [R00.1] Hypothermia [T68.XXXA] Hypothermia, initial encounter [T68.XXXA] Heel ulceration, left, with unspecified severity (HCC) [L97.429] Sepsis, due to unspecified organism, unspecified whether acute organ dysfunction present (HCC) [A41.9] Sepsis (HCC) [A41.9] Patient Active Problem List   Diagnosis Date Noted   ESRD (end stage renal disease) (HCC) 02/25/2024   Bradycardia 02/23/2024   Dyspnea, unspecified 01/19/2024   Malnutrition of moderate degree 01/15/2024   Community acquired pneumonia 01/12/2024   Aspiration pneumonia of left lower lobe (HCC) 01/11/2024   Acute on chronic respiratory failure with hypoxia (HCC) 12/13/2023   Pleural effusion 12/13/2023   Heel ulcer (HCC) 12/13/2023   Influenza A 12/13/2023   PVD (peripheral vascular disease) (HCC) 12/13/2023   Thrombocytopenia (HCC) 12/13/2023   ESRD on hemodialysis (HCC) 06/11/2023   Chronic idiopathic thrombocytopenia (HCC) 06/11/2023   End stage renal disease (HCC) 04/11/2023   Secondary hyperparathyroidism of renal origin (HCC) 04/09/2023   Dependence on renal dialysis (HCC) 03/31/2023   Hypertensive heart and chronic kidney disease with heart failure and with stage 5 chronic kidney disease, or end stage renal disease (HCC) 03/31/2023   Acute on chronic diastolic (congestive) heart failure (HCC) 03/22/2023   Anemia of chronic disease 03/22/2023   Sacral wound 03/22/2023   Acute on chronic diastolic CHF (congestive heart failure) (HCC) 02/26/2023   Cardiorenal syndrome 02/25/2023  Chronic kidney disease (CKD), stage IV (severe) (HCC) 02/25/2023   Hypoglycemia 08/15/2022   Hypertensive emergency 08/15/2022   Obesity (BMI 30-39.9) 08/15/2022   Acute on chronic diastolic heart failure (HCC) 08/14/2022   AKI (acute kidney injury) (HCC)    (HFpEF) heart failure with preserved ejection fraction (HCC)  06/02/2022   Acute respiratory distress 06/02/2022   Bilateral lower extremity edema 06/02/2022   Hypocalcemia 06/02/2022   Subclinical hypothyroidism 06/02/2022   Transaminitis 06/02/2022   Acute kidney injury superimposed on chronic kidney disease (HCC) 03/28/2022   Anasarca associated with disorder of kidney 03/27/2022   Leukopenia 03/27/2022   Aortic atherosclerosis (HCC) 03/27/2022   Pressure injury of skin 01/02/2022   Hypotension 12/30/2021   Pneumonia due to COVID-19 virus 12/26/2021   Palliative care by specialist    Goals of care, counseling/discussion    General weakness    Sepsis (HCC) 12/22/2021   COVID-19 virus infection 12/22/2021   Paroxysmal atrial fibrillation (HCC) 12/22/2021   CKD (chronic kidney disease) stage 5, GFR less than 15 ml/min (HCC) 12/22/2021   Chronic anemia 12/22/2021   Prolonged QT interval 12/22/2021   Debility 11/20/2021   Metabolic acidosis 11/20/2021   Severe sepsis (HCC) 11/19/2021   Hypothermia 11/09/2021   Hyperlipidemia 11/09/2021   Chronic combined systolic and diastolic congestive heart failure (HCC) 11/09/2021   Pancytopenia (HCC) 11/09/2021   Severe protein-calorie malnutrition (HCC) 11/09/2021   Acute combined systolic and diastolic HF (heart failure) (HCC)    Acute exacerbation of CHF (congestive heart failure) (HCC) 03/06/2021   Essential hypertension    Hypertensive urgency 03/05/2021   Chronic diastolic CHF (congestive heart failure) (HCC) 03/05/2021   Nausea and vomiting 03/05/2021   Elevated troponin level not due myocardial infarction 03/05/2021   Elevated d-dimer 03/05/2021   Acute congestive heart failure (HCC) 03/05/2021   PCP:  Estil Heman, NP Pharmacy:   [id number] [place] Nestle, [place] - [address] AT [facility] OF [facility] & [address] [place] [address] Phone: [phone] Fax: [phone]  [facility] - [place], [place] - [address] [place] [address] Phone: [phone] Fax: [phone]  [facility] [address] [place] [address] Phone: [phone] Fax: [phone]     Social Drivers of Health (SDOH) Social History: SDOH Screenings   Food Insecurity: Patient Unable To Answer (02/25/2024)  Housing: Patient Unable To Answer (02/25/2024)  Transportation Needs: No Transportation Needs (02/25/2024)  Utilities: Not At Risk (02/25/2024)  Alcohol Screen: Low Risk  (06/11/2022)  Depression (PHQ2-9): Low Risk  (01/27/2023)  Financial Resource Strain: Low Risk  (06/11/2022)  Social Connections: Moderately Integrated (02/25/2024)  Recent Concern: Social Connections - Moderately Isolated (01/11/2024)  Tobacco Use: Low Risk  (02/24/2024)   SDOH Interventions: Food Insecurity Interventions: Intervention Not Indicated Housing Interventions: Intervention Not Indicated Transportation Interventions: Intervention Not Indicated, Patient Resources (Friends/Family), Payor Benefit Utilities Interventions: Intervention Not Indicated Social Connections Interventions: Intervention Not Indicated   Readmission Risk Interventions    12/15/2023    3:44 PM 03/24/2023    2:26 PM 02/26/2023    2:53 PM  Readmission Risk Prevention Plan  Transportation Screening Complete Complete Complete  PCP or Specialist Appt within 3-5 Days Complete Complete   HRI or Home Care Consult Complete Complete Complete  Social Work Consult for Recovery Care Planning/Counseling  -- Complete  Palliative Care Screening Not Applicable Not Applicable Not  Applicable  Medication Review (RN Care Manager) Complete Complete Referral to Pharmacy

## 2024-02-27 NOTE — Progress Notes (Signed)
 Order placed for bair hugger for temp of 94.4 rectally.  When attempting to place the warmer on the patient, she refused.  Then attempted to hang patients IV antibiotics, and she said "no, I dont want thant".  Daughter at bedside, I then asked that patient why she did not want any treatments, she stated that she was "tired of this", and her daughter asked if she wanted to live and she said no.  I asked the patient if she would like to speak with the MD about expressing her wishes and she said yes.  Gerilyn Kobus PA made aware, no new orders at this time, will continue to monitor.

## 2024-02-27 NOTE — Progress Notes (Signed)
   02/27/24 1900  Assess: MEWS Score  Temp (!) 94 F (34.4 C)  BP 123/80  MAP (mmHg) 94  Resp 18  Level of Consciousness Alert  Assess: MEWS Score  MEWS Temp 2  MEWS Systolic 0  MEWS Pulse 0  MEWS RR 0  MEWS LOC 0  MEWS Score 2  MEWS Score Color Yellow  Assess: if the MEWS score is Yellow or Red  Were vital signs accurate and taken at a resting state? Yes  Does the patient meet 2 or more of the SIRS criteria? Yes  Does the patient have a confirmed or suspected source of infection? No  MEWS guidelines implemented  Yes, yellow  Treat  MEWS Interventions Considered administering scheduled or prn medications/treatments as ordered  Take Vital Signs  Increase Vital Sign Frequency  Yellow: Q2hr x1, continue Q4hrs until patient remains green for 12hrs  Escalate  MEWS: Escalate Yellow: Discuss with charge nurse and consider notifying provider and/or RRT  Notify: Charge Nurse/RN  Name of Charge Nurse/RN Notified Lawyer  Provider Notification  Provider Name/Title Janas Meadows PA  Date Provider Notified 02/27/24  Time Provider Notified 1935  Method of Notification Page  Notification Reason Other (Comment) (low temperature)  Provider response No new orders  Date of Provider Response 02/27/24  Time of Provider Response 1940  Assess: SIRS CRITERIA  SIRS Temperature  1  SIRS Respirations  0  SIRS Pulse 0  SIRS WBC 0  SIRS Score Sum  1

## 2024-02-27 NOTE — Progress Notes (Signed)
 [facility] Progress Note  Subjective:  Remains stable off the vent  Vitals:   02/27/24 1045 02/27/24 1100 02/27/24 1115 02/27/24 1130  BP: 101/61 99/61 (!) 97/56 104/70  Pulse: (!) 55 (!) 52 (!) 55 (!) 59  Resp: 20 14 19 10   Temp:    (!) 96 F (35.6 C)  TempSrc:      SpO2: 100% 100% 100% 100%  Weight:      Height:        Exam: Gen on vent, sedated Sclera anicteric, throat w/ ETT No jvd or bruits Chest clear anterior/ lateral RRR no MRG Abd soft ntnd no mass or ascites +bs Ext no LE edema Neuro is on vent, sedated    LUE AVF+bruit       Renal-related home meds: Coreg  3.125 Mg twice daily on nondialysis days just take the evening dose on dialysis days Midodrine  10 mg as needed and also take 10 mg predialysis on dialysis days Rena-Vite 1 daily Others: PPI, Remeron , Eliquis       [place] HD: [place] [facility] 3h  B350   46.1kg  3K bath  L AVF  Heparin  4000       Assessment/ Plan: Septic shock/ aspiration PNA: getting IV unasyn , IV steroids. Off pressors, on midodrine  10 tid.  Acute hypoxic resp failure: better, down to 1 L [place], cont to lower vol  Prob adrenal insufficiency: causing low BS, bradycardia. Getting IV steroids ESRD: on HD TTS. Had HD Wed off schedule. Next HD today.   BP: bp's stable today, 120/80 range.  Volume: +LE edema which looks chronic. Up 2-3 kg by wts. UF 2.5 w/ HD today Anemia of esrd: Hb 7-9, follow, transfuse as needed Secondary hyperparathyroidism: CCa in range, continue binders with meals Chronic left foot wound Chronic pancytopenia H/o PE: from march 2025, on eliquis         Rob Dax Murguia MD  [facility] 02/27/2024, 11:51 AM  Recent Labs  Lab 02/24/24 0549 02/24/24 1838 02/25/24 0535 02/26/24 0448 02/27/24 0514 02/27/24 0917  HGB 7.3*   < > 8.3* 8.0*  --  8.5*  ALBUMIN  2.6*  --  2.2*  --   --   --   CALCIUM  8.4*   < > 8.2* 7.6* 7.5*  --   PHOS  --    < > 2.9 2.2* 3.1  --   CREATININE 2.33*   < > 2.63* 1.78* 2.13*  --   K 4.4    < > 3.6 3.6 3.7  --    < > = values in this interval not displayed.   No results for input(s): "IRON ", "TIBC", "FERRITIN" in the last 168 hours. Inpatient medications:  cadexomer iodine   Topical Daily   Chlorhexidine  Gluconate Cloth  6 each Topical Q0600   feeding supplement  1 Container Oral TID BM   leptospermum manuka honey  1 Application Topical Daily   midodrine   10 mg Oral TID WC   multivitamin  1 tablet Oral Once per day on Monday Wednesday Friday Saturday   mouth rinse  15 mL Mouth Rinse 4 times per day   pantoprazole   40 mg Oral Daily   polyethylene glycol  17 g Oral Daily   sodium chloride  flush  10-40 mL Intracatheter Q12H   thiamine   100 mg Oral Daily    ampicillin -sulbactam (UNASYN ) IV Stopped (02/26/24 2149)   anticoagulant sodium citrate      dextrose  75 mL/hr at 02/27/24 1000   alteplase , anticoagulant sodium citrate , feeding supplement (  NEPRO CARB STEADY), heparin , [START ON 02/28/2024] heparin , ipratropium-albuterol , lidocaine  (PF), lidocaine -prilocaine , mouth rinse, pentafluoroprop-tetrafluoroeth, polyethylene glycol, sodium chloride  flush

## 2024-02-27 NOTE — Progress Notes (Signed)
   02/27/24 1208  Vitals  Temp (!) 96.3 F (35.7 C)  Pulse Rate 65  Resp 18  BP (!) 98/59  SpO2 100 %  O2 Device Nasal Cannula  Weight 53.5 kg  Type of Weight Post-Dialysis  Oxygen Therapy  O2 Flow Rate (L/min) 1 L/min  Patient Activity (if Appropriate) In bed  Pulse Oximetry Type Continuous  Oximetry Probe Site Changed No  Post Treatment  Dialyzer Clearance Lightly streaked  Hemodialysis Intake (mL) 0 mL  Liters Processed 68.3  Fluid Removed (mL) 2000 mL  Tolerated HD Treatment Yes   Tx done at pt bedsde Alert and oriented.  Informed consent signed and in chart.   TX duration:3.15  Patient tolerated well.  Alert, without acute distress.  Hand-off given to patient's nurse.   Access used: RIJ trialysis Access issues: no complications  Total UF removed: 2000 Medication(s) given: none   [facility]

## 2024-02-27 NOTE — Progress Notes (Signed)
 NAME:  Kara Mullins, MRN:  [id number], DOB:  30-Jan-1951, LOS: 4 ADMISSION DATE:  02/23/2024, CONSULTATION DATE:  02/24/2024 REFERRING MD: Maylene Spear, MD, CHIEF COMPLAINT:  hypotension    History of Present Illness:  A 73 yr old female with ESRD on HD on TTS (AVF), diagnosed PE (01/2024) on Eliquis , Anemia of chronic illness, chronic pancytopenia, chronic diastolic dysfunction (01/12/2024: Echo EF 60%, G1DD, RVSP 31.3 mmHg), PVD, nonhealing left heel ulcer, chronic hypotension since last admission (late March 2025), needed midodrine , PCM, deconditioned, bed bound/wheelchair bound, poor oral intake, and recently admitted to the hospital twice first time for pneumonia from influenza then following which patient also was admitted for pneumonia from aspiration discharged on 01/18/2024. She was brought to the ER after patient's daughter felt that patient was not doing well and was found to be bradycardic, hypothermic, hypotensive, and hypoglycemic. She has no specific complaints like cough, SOB, N/V/D, abd pain, CP, dysuria, or rash. She was admitted to the hospitalist's service and given IV steroids, NS 0.9% bolus 2 L total over 24 hrs, Vanco, Cefepime , D50 bolus, D5w drip, heating blanket, and Midodrine  10 mg tid. HD was not done due to hypotension. PCCM was consulted to evaluate. When I entered the room, she code blue was activated and HR was 31 sinus, SpO2 61%, agonal breathing, and given Epi 1 mg, intubated ETT 7.0 @23  cm at the gum line, and stat labs were sent. She did not lose pulse. Daughter at the bedside.   Pertinent  Medical History  ESRD on HD on TTS (AVF), diagnosed PE (01/2024) on Eliquis , Anemia of chronic illness, chronic pancytopenia, chronic diastolic dysfunction, PVD, nonhealing left heel ulcer, chronic hypotension since last admission (late March 2025), PCM, deconditioned, bed bound/wheelchair bound  Significant Hospital Events: Including procedures, antibiotic start and stop  dates in addition to other pertinent events   5/12: admission to hospitalist team 5/13: Intubated on the floor due to hypotension, unresponsiveness, transferred to ICU  5/14 off pressors.  Received hemodialysis  5/15 extubated  Interim History / Subjective:  Patient was successfully extubated yesterday Still getting recurrent hypoglycemic episodes, on D10 at 75 cc Passed swallow evaluation will add dietary supplements  Objective    Blood pressure 110/64, pulse (!) 59, temperature (!) 97.4 F (36.3 C), resp. rate 14, height 5\' 5"  (1.651 m), weight 55.5 kg, SpO2 100%.        Intake/Output Summary (Last 24 hours) at 02/27/2024 0934 Last data filed at 02/26/2024 1900 Gross per 24 hour  Intake 429.57 ml  Output --  Net 429.57 ml   Filed Weights   02/25/24 1330 02/25/24 1708 02/26/24 0500  Weight: 49.4 kg 49.4 kg 55.5 kg    Examination: General: Chronically ill-appearing female, lying on the bed HEENT: [place]/AT, eyes anicteric.  moist mucus membranes Neuro: Alert, awake following commands Chest: Coarse breath sounds, no wheezes or rhonchi Heart: Regular rate and rhythm, no murmurs or gallops Abdomen: Soft, nontender, nondistended, bowel sounds present  Labs reviewed  Resolved problem list  Sepsis with septic shock  Assessment and Plan  Sepsis with septic shock due to aspiration pneumonia  Acute respiratory failure with hypoxia Probable relative adrenal insufficiency, leading to hypoglycemia, bradycardia End-stage renal disease on hemodialysis Subacute pulmonary emboli diagnosed after 2025 Chronic pancytopenia Chronic HFpEF Severe protein calorie malnutrition Chronic nonhealing ulcer on left heel, POA Recurrent hypoglycemia Acute metabolic encephalopathy in the setting of hypoglycemia and hypotension  Patient remain off vasopressors for 48 hours Shock is resolved Continue  IV Unasyn  Her blood pressure is stabilized but blood sugar remained low She is on D10  infusion Encourage oral intake, will add dietary supplements Patient's platelet count started improving but is still at 49, will hold off on anticoagulation Reevaluate restarting back anticoagulation with Eliquis  by tomorrow Watch for signs of bleeding Mental status has significantly improved Continue routine wound care   Best Practice (right click and "Reselect all SmartList Selections" daily)   Diet/type: Tube feeds DVT prophylaxis SCD Pressure ulcer(s): present on admission .  Please refer to nursing notes GI prophylaxis: PPI Lines: N/A Foley:  N/A Code Status:  DNR Last date of multidisciplinary goals of care discussion: 5/14, palliative care met with family, they decided to keep her DNR, proceed with one-way extubation  Labs   CBC: Recent Labs  Lab 02/23/24 1852 02/23/24 2020 02/24/24 0549 02/24/24 1838 02/24/24 2104 02/25/24 0506 02/25/24 0535 02/26/24 0448  WBC 2.5*  --  3.2* 7.5  --   --  10.7* 8.4  NEUTROABS  --   --  2.5 3.0  --   --   --  7.3  HGB 8.1*  --  7.3* 5.9* 7.1* 8.8* 8.3* 8.0*  HCT 27.2*  --  23.9* 20.0* 21.0* 26.0* 25.5* 24.5*  MCV 95.8  --  93.4 96.2  --   --  90.4 89.4  PLT 37* 36* 41* 35*  --   --  39* 49*    Basic Metabolic Panel: Recent Labs  Lab 02/24/24 0549 02/24/24 1838 02/24/24 2104 02/25/24 0506 02/25/24 0535 02/26/24 0448 02/27/24 0514  NA 135 136 136 135 135 131* 128*  K 4.4 3.3* 3.3* 3.7 3.6 3.6 3.7  CL 100 105  --   --  101 96* 94*  CO2 29 23  --   --  26 28 29   GLUCOSE 64* 175*  --   --  102* 68* 84  BUN 18 18  --   --  21 13 23   CREATININE 2.33* 2.36*  --   --  2.63* 1.78* 2.13*  CALCIUM  8.4* 7.3*  --   --  8.2* 7.6* 7.5*  MG  --  1.8  --   --   --  1.7 1.8  PHOS  --  3.0  --   --  2.9 2.2* 3.1   GFR: Estimated Creatinine Clearance: 21.2 mL/min (A) (by C-G formula based on SCr of 2.13 mg/dL (H)). Recent Labs  Lab 02/24/24 0549 02/24/24 1120 02/24/24 1838 02/24/24 2031 02/25/24 0534 02/25/24 0535  02/26/24 0448  PROCALCITON  --   --   --   --  0.74  --   --   WBC 3.2*  --  7.5  --   --  10.7* 8.4  LATICACIDVEN  --  1.1 2.0* 2.9*  --  1.4  --     Liver Function Tests: Recent Labs  Lab 02/24/24 0549 02/25/24 0535  AST 49*  --   ALT 15  --   ALKPHOS 73  --   BILITOT 1.1  --   PROT 6.4*  --   ALBUMIN  2.6* 2.2*   No results for input(s): "LIPASE", "AMYLASE" in the last 168 hours. No results for input(s): "AMMONIA" in the last 168 hours.  ABG    Component Value Date/Time   PHART 7.378 02/25/2024 0506   PCO2ART 44.6 02/25/2024 0506   PO2ART 152 (H) 02/25/2024 0506   HCO3 26.4 02/25/2024 0506   TCO2 28 02/25/2024 0506   O2SAT 99  02/25/2024 0506     Coagulation Profile: Recent Labs  Lab 02/23/24 2020  INR 1.2    Cardiac Enzymes: No results for input(s): "CKTOTAL", "CKMB", "CKMBINDEX", "TROPONINI" in the last 168 hours.  HbA1C: Hgb A1c MFr Bld  Date/Time Value Ref Range Status  01/13/2024 05:03 AM 4.1 (L) 4.8 - 5.6 % Final    Comment:    (NOTE) Pre diabetes:          5.7%-6.4%  Diabetes:              >6.4%  Glycemic control for   <7.0% adults with diabetes     CBG: Recent Labs  Lab 02/26/24 2049 02/27/24 0047 02/27/24 0400 02/27/24 0615 02/27/24 0813  GLUCAP 66* 76 71 83 77      Trevor Fudge, MD [facility] See Amion for pager If no response to pager, please call [phone] until 7pm After 7pm, Please call E-link [phone]

## 2024-02-27 NOTE — Evaluation (Signed)
 Occupational Therapy Evaluation Patient Details Name: Kara Mullins MRN: [id number] DOB: [birth year] Today's Date: 02/27/2024   History of Present Illness   73 y.o. female presents to [facility] 02/23/24 due to not feeling well and found to be bradycardic. 5/13 code blue activation with transfer to ICU for evolving shock, AMS, and acute respiratory failure. Intubated 5/13-5/15. 5/12-5/16 ICU stay. Recent d/c 4/6 for PNA 2/2 aspiration. PMHx: ESRD on HD on TTS (AVF), diagnosed PE (01/2024) on Eliquis , anemia, chronic pancytopenia, chronic diastolic dysfunction, PVD, nonhealing left heel ulcer, chronic hypotension, PCM     Clinical Impressions At baseline, pt completes UB ADLs with Set up to Min assist, LB ADLs with Max to Total assist, functional transfers with assist with use of Stedy, and functional mobility with assist with manual wheelchair. At baseline, pt receives assistance from family for IADLs. Pt now presents with decreased activity tolerance, generalized B UE weakness, decreased B UE coordination, decreased balance, and decreased safety and independence with functional tasks. Pt currently demonstrates ability to complete UB ADLs with Min to Max assist, LB ADLs with Total assist, and bed mobility with Total assist +2. Pt participated well in session and has good family support. Pt will benefit from acute skilled OT services to address deficits outlined below and to increase safety and independence with functional tasks. Post acute discharge, pt will benefit from [facility] OT to maximize rehab potential paired with continued 24/7 supervision/assistance of family.     If plan is discharge home, recommend the following:   Two people to help with walking and/or transfers;A lot of help with bathing/dressing/bathroom;Assistance with cooking/housework;Assistance with feeding;Direct supervision/assist for medications management;Direct supervision/assist for financial management;Assist for  transportation;Help with stairs or ramp for entrance     Functional Status Assessment   Patient has had a recent decline in their functional status and demonstrates the ability to make significant improvements in function in a reasonable and predictable amount of time.     Equipment Recommendations   None recommended by OT (Pt already has needed equipment)     Recommendations for Other Services         Precautions/Restrictions   Precautions Precautions: Fall Restrictions Weight Bearing Restrictions Per Provider Order: No     Mobility Bed Mobility Overal bed mobility: Needs Assistance Bed Mobility: Supine to Sit, Sit to Supine     Supine to sit: Total assist, +2 for safety/equipment, +2 for physical assistance Sit to supine: Total assist, +2 for physical assistance, +2 for safety/equipment   General bed mobility comments: pt unable to assist with bed mobility requiring TotalAx2 using bed pad and helicopter method.    Transfers                   General transfer comment: Deferred 2/2 safety      Balance Overall balance assessment: Needs assistance, Mild deficits observed, not formally tested Sitting-balance support: Single extremity supported, Bilateral upper extremity supported, Feet supported Sitting balance-Leahy Scale: Poor Sitting balance - Comments: ModA to MinA required for posterior lean. Pt would start to fall asleep and lean posteriorly Postural control: Posterior lean                                 ADL either performed or assessed with clinical judgement   ADL Overall ADL's : Needs assistance/impaired Eating/Feeding: Minimal assistance;Bed level (CGA)   Grooming: Minimal assistance;Moderate assistance;Bed level   Upper Body Bathing: Moderate assistance;Maximal assistance;Bed  level   Lower Body Bathing: Total assistance;Bed level   Upper Body Dressing : Moderate assistance;Maximal assistance;Bed level   Lower Body  Dressing: Total assistance;Bed level     Toilet Transfer Details (indicate cue type and reason): deferred this session for pt/therapist safety Toileting- Clothing Manipulation and Hygiene: Total assistance;Bed level         General ADL Comments: Pt with significantly decreased activity tolerance affecting functional level     Vision Patient Visual Report: No change from baseline Vision Assessment?: No apparent visual deficits     Perception         Praxis         Pertinent Vitals/Pain Pain Assessment Pain Assessment: No/denies pain     Extremity/Trunk Assessment Upper Extremity Assessment Upper Extremity Assessment: Right hand dominant;Generalized weakness;RUE deficits/detail;LUE deficits/detail RUE Deficits / Details: generalized weakness; decreased coordination; PROM/AAROM shoulder flexion to approx 100 degrees, all other AAROM WFL RUE Coordination: decreased fine motor;decreased gross motor LUE Deficits / Details: generalized weakness; decreased coordination; PROM/AAROM shoulder flexion to approx 90 degrees, all other AAROM WFL LUE Coordination: decreased fine motor;decreased gross motor   Lower Extremity Assessment Lower Extremity Assessment: Defer to PT evaluation   Cervical / Trunk Assessment Cervical / Trunk Assessment: Kyphotic   Communication Communication Communication: Impaired Factors Affecting Communication: Difficulty expressing self;Other (comment) (requires increased time for processing; soft spoken)   Cognition Arousal: Lethargic Behavior During Therapy: Flat affect Cognition: Cognition impaired     Awareness: Intellectual awareness intact, Online awareness intact Memory impairment (select all impairments): Short-term memory, Working memory Attention impairment (select first level of impairment): Alternating attention Executive functioning impairment (select all impairments): Problem solving, Organization OT - Cognition Comments: PT oriented x4  but requiring increased time for processing and motor planning. Pt pleasant throughout session.                 Following commands: Impaired Following commands impaired: Follows one step commands with increased time, Follows one step commands inconsistently     Cueing  General Comments   Cueing Techniques: Verbal cues;Tactile cues  Daughter present and supportive through session   Exercises     Shoulder Instructions      Home Living Family/patient expects to be discharged to:: Private residence Living Arrangements: Children Available Help at Discharge: Family;Available 24 hours/day Type of Home: House Home Access: Ramped entrance     Home Layout: One level     Bathroom Shower/Tub: Sponge bathes at baseline;Tub/shower unit   Bathroom Toilet: Handicapped height Bathroom Accessibility: Yes How Accessible: Other (comment) (Accessible via Sera Stedy) Home Equipment: Rollator (4 wheels);Wheelchair - manual;BSC/3in1;Other (comment);Cane - single point;Hospital bed;Lift chair (Sera Octaviano Belts)          Prior Functioning/Environment Prior Level of Function : Needs assist             Mobility Comments: Pt transfers via the stedy with family assist and reports it can fit in/out of the bathroom. She spends most her day in a recliner chair. She mobilizes using a w/c relying on family to push her. ADLs Comments: Pt is able to feed herself and do grooming tasks. She reports she can assist with UB dressing but needs family for LB dressing. Daughter helps with bathing, toileting (wears depends), medications, finances, transportation, and household management tasks.    OT Problem List: Decreased strength;Decreased activity tolerance;Decreased range of motion;Impaired balance (sitting and/or standing);Decreased coordination;Decreased cognition   OT Treatment/Interventions: Self-care/ADL training;Therapeutic exercise;DME and/or AE instruction;Therapeutic activities;Patient/family  education;Balance training  OT Goals(Current goals can be found in the care plan section)   Acute Rehab OT Goals Patient Stated Goal: to return home and feel better OT Goal Formulation: With patient/family Time For Goal Achievement: 03/12/24 Potential to Achieve Goals: Good ADL Goals Pt Will Perform Eating: with set-up;sitting Pt Will Perform Grooming: with set-up;with supervision;sitting Pt Will Perform Upper Body Bathing: with min assist;sitting Pt Will Perform Upper Body Dressing: with min assist;sitting   OT Frequency:  Min 2X/week    Co-evaluation PT/OT/SLP Co-Evaluation/Treatment: Yes Reason for Co-Treatment: For patient/therapist safety;To address functional/ADL transfers PT goals addressed during session: Mobility/safety with mobility;Balance OT goals addressed during session: ADL's and self-care;Strengthening/ROM      AM-PAC OT "6 Clicks" Daily Activity     Outcome Measure Help from another person eating meals?: A Little Help from another person taking care of personal grooming?: A Lot Help from another person toileting, which includes using toliet, bedpan, or urinal?: Total Help from another person bathing (including washing, rinsing, drying)?: A Lot Help from another person to put on and taking off regular upper body clothing?: A Lot Help from another person to put on and taking off regular lower body clothing?: Total 6 Click Score: 11   End of Session Nurse Communication: Mobility status  Activity Tolerance: Patient limited by fatigue;Patient limited by lethargy;Patient tolerated treatment well Patient left: in bed;with call bell/phone within reach;with family/visitor present  OT Visit Diagnosis: Unsteadiness on feet (R26.81);Other abnormalities of gait and mobility (R26.89);Muscle weakness (generalized) (M62.81);Ataxia, unspecified (R27.0);Other symptoms and signs involving cognitive function                Time: 8657-8469 OT Time Calculation (min): 29  min Charges:  OT General Charges $OT Visit: 1 Visit OT Evaluation $OT Eval Moderate Complexity: 1 Mod  [address] Foster [address]Molson Coors Brewing., OTR/L, MA Acute Rehab [phone]  Walt Gunner 02/27/2024, 6:13 PM

## 2024-02-27 NOTE — Plan of Care (Signed)
   Problem: Safety: Goal: Ability to remain free from injury will improve Outcome: Progressing   Problem: Skin Integrity: Goal: Risk for impaired skin integrity will decrease Outcome: Progressing

## 2024-02-27 NOTE — Evaluation (Signed)
 Physical Therapy Evaluation Patient Details Name: Kara Mullins MRN: [id number] DOB: 07-17-1951 Today's Date: 02/27/2024  History of Present Illness  73 y.o. female presents to [facility] 02/23/24 due to not feeling well and found to be bradycardic. 5/13 code blue activation with transfer to ICU for evolving shock, AMS, and acute respiratory failure. Intubated 5/13-5/15. 5/12-5/16 ICU stay. Recent d/c 4/6 for PNA 2/2 aspiration. PMHx: ESRD on HD on TTS (AVF), diagnosed PE (01/2024) on Eliquis , anemia, chronic pancytopenia, chronic diastolic dysfunction, PVD, nonhealing left heel ulcer, chronic hypotension, PCM   Clinical Impression  Pt in bed upon arrival with daughter present and agreeable to PT eval. PTA, pt had assist from family to transfer using Dwana Gist and would have family push WC for mobility. In today's session, pt was limited by lethargy after completing HD. Pt did not initiate bed mobility and requiring TotalAx2 using bed pad and helicopter method. Once seated on EOB, pt required ModA to MinA to correct posterior lean. Pt would start to fall asleep and require multiple tactile cues to maintain alertness. Deferred transfer 2/2 safety. Pt will have 24/7 physical assist available upon return home. Recommending HHPT to address functional deficits. Pt currently with functional limitations due to the deficits listed below (see PT Problem List). Acute PT to follow.         If plan is discharge home, recommend the following: A lot of help with walking and/or transfers;A lot of help with bathing/dressing/bathroom;Assistance with cooking/housework;Assist for transportation;Help with stairs or ramp for entrance     Equipment Recommendations None recommended by PT     Functional Status Assessment Patient has had a recent decline in their functional status and demonstrates the ability to make significant improvements in function in a reasonable and predictable amount of time.     Precautions /  Restrictions Precautions Precautions: Fall Restrictions Weight Bearing Restrictions Per Provider Order: No      Mobility  Bed Mobility Overal bed mobility: Needs Assistance Bed Mobility: Supine to Sit, Sit to Supine     Supine to sit: Total assist, +2 for safety/equipment, +2 for physical assistance Sit to supine: Total assist, +2 for physical assistance, +2 for safety/equipment   General bed mobility comments: pt unable to assist with bed mobility requiring TotalAx2 using bed pad and helicopter method.    Transfers    General transfer comment: Deferred 2/2 safety      Balance Overall balance assessment: Needs assistance, Mild deficits observed, not formally tested Sitting-balance support: Bilateral upper extremity supported, Feet supported Sitting balance-Leahy Scale: Poor Sitting balance - Comments: ModA to MinA required for posterior lean. Pt would start to fall asleep and lean posteriorly Postural control: Posterior lean       Pertinent Vitals/Pain Pain Assessment Pain Assessment: No/denies pain    Home Living Family/patient expects to be discharged to:: Private residence Living Arrangements: Children (daughter and son-in-law) Available Help at Discharge: Family;Available 24 hours/day Type of Home: House Home Access: Ramped entrance       Home Layout: One level Home Equipment: Rollator (4 wheels);Wheelchair - manual;BSC/3in1;Other (comment);Cane - single point;Hospital bed;Lift chair Dwana Gist)      Prior Function Prior Level of Function : Needs assist    Mobility Comments: Pt transfers via the stedy with family assist and reports it can fit in/out of the bathroom. She spends most her day in a recliner chair. She mobilizes using a w/c relying on family to push her. ADLs Comments: Pt is able to feel herself and  do her hair. She reports she can assist with UB dressing but needs family for LB dressing. Daughter helps with bathing, toileting (wears depends)  medications, finances, transportation, and household management.     Extremity/Trunk Assessment   Upper Extremity Assessment Upper Extremity Assessment: Defer to OT evaluation    Lower Extremity Assessment Lower Extremity Assessment: Generalized weakness;Difficult to assess due to impaired cognition    Cervical / Trunk Assessment Cervical / Trunk Assessment: Kyphotic  Communication   Communication Communication: Impaired Factors Affecting Communication: Difficulty expressing self (soft spoken)    Cognition Arousal: Lethargic Behavior During Therapy: Flat affect   PT - Cognitive impairments: Difficult to assess Difficult to assess due to: Level of arousal    PT - Cognition Comments: pt was lethargic and needed tactile stimulation to maintain alertness. Had difficulty keeping eyes open Following commands: Impaired Following commands impaired: Follows one step commands with increased time     Cueing Cueing Techniques: Verbal cues, Tactile cues     General Comments General comments (skin integrity, edema, etc.): Daughter present and supportive through session     PT Assessment Patient needs continued PT services  PT Problem List Decreased strength;Decreased range of motion;Decreased activity tolerance;Decreased balance;Decreased mobility       PT Treatment Interventions DME instruction;Functional mobility training;Therapeutic activities;Therapeutic exercise;Balance training;Neuromuscular re-education;Patient/family education;Wheelchair mobility training    PT Goals (Current goals can be found in the Care Plan section)  Acute Rehab PT Goals Patient Stated Goal: to get stronger PT Goal Formulation: With patient/family Time For Goal Achievement: 03/12/24 Potential to Achieve Goals: Good    Frequency Min 2X/week     Co-evaluation PT/OT/SLP Co-Evaluation/Treatment: Yes Reason for Co-Treatment: For patient/therapist safety;To address functional/ADL transfers PT goals  addressed during session: Mobility/safety with mobility;Balance         AM-PAC PT "6 Clicks" Mobility  Outcome Measure Help needed turning from your back to your side while in a flat bed without using bedrails?: Total Help needed moving from lying on your back to sitting on the side of a flat bed without using bedrails?: Total Help needed moving to and from a bed to a chair (including a wheelchair)?: Total Help needed standing up from a chair using your arms (e.g., wheelchair or bedside chair)?: Total Help needed to walk in hospital room?: Total Help needed climbing 3-5 steps with a railing? : Total 6 Click Score: 6    End of Session   Activity Tolerance: Patient limited by lethargy;Patient limited by fatigue Patient left: in bed;with call bell/phone within reach;with family/visitor present Nurse Communication: Mobility status;Need for lift equipment PT Visit Diagnosis: Unsteadiness on feet (R26.81);Other abnormalities of gait and mobility (R26.89);Muscle weakness (generalized) (M62.81)    Time: 1610-9604 PT Time Calculation (min) (ACUTE ONLY): 29 min   Charges:   PT Evaluation $PT Eval Moderate Complexity: 1 Mod   PT General Charges $$ ACUTE PT VISIT: 1 Visit       Orysia Blas, PT, DPT Secure Chat Preferred  Rehab Office [phone]   Alissa April Adela Ades 02/27/2024, 4:54 PM

## 2024-02-28 DIAGNOSIS — R001 Bradycardia, unspecified: Secondary | ICD-10-CM | POA: Diagnosis not present

## 2024-02-28 DIAGNOSIS — A419 Sepsis, unspecified organism: Secondary | ICD-10-CM | POA: Diagnosis not present

## 2024-02-28 DIAGNOSIS — Z515 Encounter for palliative care: Secondary | ICD-10-CM | POA: Diagnosis not present

## 2024-02-28 DIAGNOSIS — Z66 Do not resuscitate: Secondary | ICD-10-CM

## 2024-02-28 DIAGNOSIS — N186 End stage renal disease: Secondary | ICD-10-CM | POA: Diagnosis not present

## 2024-02-28 DIAGNOSIS — Z7189 Other specified counseling: Secondary | ICD-10-CM | POA: Diagnosis not present

## 2024-02-28 DIAGNOSIS — R6520 Severe sepsis without septic shock: Secondary | ICD-10-CM

## 2024-02-28 LAB — CBC
HCT: 20.3 % — ABNORMAL LOW (ref 36.0–46.0)
Hemoglobin: 6.6 g/dL — CL (ref 12.0–15.0)
MCH: 29.3 pg (ref 26.0–34.0)
MCHC: 32.5 g/dL (ref 30.0–36.0)
MCV: 90.2 fL (ref 80.0–100.0)
Platelets: 56 10*3/uL — ABNORMAL LOW (ref 150–400)
RBC: 2.25 MIL/uL — ABNORMAL LOW (ref 3.87–5.11)
RDW: 16.9 % — ABNORMAL HIGH (ref 11.5–15.5)
WBC: 7 10*3/uL (ref 4.0–10.5)
nRBC: 0.6 % — ABNORMAL HIGH (ref 0.0–0.2)

## 2024-02-28 LAB — GLUCOSE, CAPILLARY
Glucose-Capillary: 129 mg/dL — ABNORMAL HIGH (ref 70–99)
Glucose-Capillary: 114 mg/dL — ABNORMAL HIGH (ref 70–99)

## 2024-02-28 LAB — CULTURE, BLOOD (ROUTINE X 2): Culture: NO GROWTH

## 2024-02-28 LAB — PREPARE RBC (CROSSMATCH)

## 2024-02-28 MED ORDER — SODIUM CHLORIDE 0.9% IV SOLUTION
Freq: Once | INTRAVENOUS | Status: DC
Start: 2024-02-28 — End: 2024-02-29

## 2024-02-28 MED ORDER — MELATONIN 5 MG PO TABS
5.0000 mg | ORAL_TABLET | Freq: Every evening | ORAL | Status: DC | PRN
Start: 2024-02-28 — End: 2024-02-29
  Administered 2024-02-28: 5 mg via ORAL
  Filled 2024-02-28: qty 1

## 2024-02-28 MED ORDER — PHENOL 1.4 % MT LIQD
1.0000 | OROMUCOSAL | Status: DC | PRN
Start: 2024-02-28 — End: 2024-02-29

## 2024-02-28 MED ORDER — PANTOPRAZOLE SODIUM 40 MG PO TBEC
40.0000 mg | DELAYED_RELEASE_TABLET | Freq: Two times a day (BID) | ORAL | Status: DC
  Administered 2024-02-28 – 2024-02-29 (×2): 40 mg via ORAL
  Filled 2024-02-28 (×2): qty 1

## 2024-02-28 MED ORDER — SODIUM CHLORIDE 0.9 % IV SOLN
3.0000 g | Freq: Every day | INTRAVENOUS | Status: DC
  Administered 2024-02-28: 3 g via INTRAVENOUS
  Filled 2024-02-28: qty 8

## 2024-02-28 NOTE — Plan of Care (Signed)

## 2024-02-28 NOTE — Progress Notes (Addendum)
 PROGRESS NOTE    Kara Mullins  ZOX:[id number] DOB: 06-27-1951 DOA: 02/23/2024 PCP: Estil Heman, NP   Brief Narrative: 73 year old with past medical history significant for ESRD on hemodialysis TTS, diagnosed PE/2025 on Eliquis , anemia chronic illness, chronic pancytopenia, chronic diastolic dysfunction, PVD, nonhealing left heel ulcer, chronic hypotension since last admission March 2025, midodrine , deconditioning, bedbound, wheelchair-bound, recent 2 hospitalizations so far, presented with hypothermia, hypotension, bradycardia, hypoglycemia.  No specific complaints.  She was admitted received IV steroids IV bolus, IV antibiotic.  CCM was consulted and as they were entering the room to evaluate patient CODE BLUE was activated.  Heart rate was 30s oxygen 61 agonal breathing.  Patient was intubated received IV epi.  She did not lost pulse.  5/12: admission to hospitalist team 5/13: Intubated on the floor due to hypotension, unresponsiveness, transferred to ICU  5/14 off pressors.  Received hemodialysis  5/15 extubated  Assessment & Plan:   Principal Problem:   Hypothermia Active Problems:   Sepsis (HCC)   ESRD on hemodialysis (HCC)   Essential hypertension   Pancytopenia (HCC)   Heel ulcer (HCC)   PVD (peripheral vascular disease) (HCC)   Paroxysmal atrial fibrillation (HCC)   Bradycardia   ESRD (end stage renal disease) (HCC)   1-Sepsis with Septic Shock in the setting of Aspiration Pneumonia: - Patient required IV vasopressors, IV antibiotics.  Was on IV steroid which has been discontinued. - Continue with IV Unasyn  - Blood pressure stable continue with midodrine  - Cultures no growth to date.  Acute hypoxic respiratory failure Patient was intubated on the floor due to hypotension, unresponsiveness and transferred to the ICU Suspect  related to aspiration pneumonia Speech evaluation.   Probably relative adrenal insufficiency Sepsis show could have led to to  hypoglycemia and bradycardia Cortisol level was 11.5 on admission Has been weaning off of IV steroids by ICU team  ESRD on hemodialysis Nephrology is following Patient said this morning that she wished to continue with hemodialysis  History of subacute PE 2025 - Awaiting CBC results to resume Eliquis  make sure that platelets are stable  Chronic pancytopenia Platelet count improving, continue to monitor hemoglobin  Chronic heart failure preserved ejection fraction: - Volume managed with hemodialysis  Severe protein caloric malnutrition - Start Boost  Hypoglycemia  could be in the setting of poor oral intake getting D10  Chronic nonhealing ulcer left heel, POA - Evaluated by podiatry 5/13.  Will recommend no surgical plans.  Continue local wound care. No candidate for revascularization per recent [facility]VS notes. -Broad spectrum p.o. oral antibiotic on DC for 2 weeks.  Dysphagia:  Previous  MBS showed contrast retain esophagus.  Will proceed with Esophagogram,   Acute metabolic encephalopathy in the setting of hypoglycemia and hypotension Mental Status improving      See wound  documentation below  Pressure Injury 01/11/24 Sacrum Stage 2 -  Partial thickness loss of dermis presenting as a shallow open injury with a red, pink wound bed without slough. (Active)  01/11/24 0900  Location: Sacrum  Location Orientation:   Staging: Stage 2 -  Partial thickness loss of dermis presenting as a shallow open injury with a red, pink wound bed without slough.  Wound Description (Comments):   Present on Admission: Yes     Pressure Injury 01/11/24 Buttocks Right;Medial Stage 2 -  Partial thickness loss of dermis presenting as a shallow open injury with a red, pink wound bed without slough. (Active)  01/11/24 0900  Location: Buttocks  Location  Orientation: Right;Medial  Staging: Stage 2 -  Partial thickness loss of dermis presenting as a shallow open injury with a red, pink wound bed  without slough.  Wound Description (Comments):   Present on Admission: Yes     Pressure Injury 01/11/24 Buttocks Left;Medial;Lower Stage 2 -  Partial thickness loss of dermis presenting as a shallow open injury with a red, pink wound bed without slough. (Active)  01/11/24 0900  Location: Buttocks  Location Orientation: Left;Medial;Lower  Staging: Stage 2 -  Partial thickness loss of dermis presenting as a shallow open injury with a red, pink wound bed without slough.  Wound Description (Comments):   Present on Admission: Yes  Dressing Type Foam - Lift dressing to assess site every shift 02/27/24 1930     Pressure Injury 01/11/24 Heel Left;Posterior;Mid Unstageable - Full thickness tissue loss in which the base of the injury is covered by slough (yellow, tan, gray, green or brown) and/or eschar (tan, brown or black) in the wound bed. yellow/white slough i (Active)  01/11/24 0900  Location: Heel  Location Orientation: Left;Posterior;Mid  Staging: Unstageable - Full thickness tissue loss in which the base of the injury is covered by slough (yellow, tan, gray, green or brown) and/or eschar (tan, brown or black) in the wound bed.  Wound Description (Comments): yellow/white slough in middle, dark hard black skin surrounding  Present on Admission: Yes     Pressure Injury 02/24/24 Heel Left Unstageable - Full thickness tissue loss in which the base of the injury is covered by slough (yellow, tan, gray, green or brown) and/or eschar (tan, brown or black) in the wound bed. (Active)  02/24/24   Location: Heel  Location Orientation: Left  Staging: Unstageable - Full thickness tissue loss in which the base of the injury is covered by slough (yellow, tan, gray, green or brown) and/or eschar (tan, brown or black) in the wound bed.  Wound Description (Comments):   Present on Admission: Yes  Dressing Type Foam - Lift dressing to assess site every shift 02/27/24 1930     Pressure Injury 02/24/24 Sacrum  Medial;Mid Stage 2 -  Partial thickness loss of dermis presenting as a shallow open injury with a red, pink wound bed without slough. (Active)  02/24/24 2000  Location: Sacrum  Location Orientation: Medial;Mid  Staging: Stage 2 -  Partial thickness loss of dermis presenting as a shallow open injury with a red, pink wound bed without slough.  Wound Description (Comments):   Present on Admission: Yes  Dressing Type Foam - Lift dressing to assess site every shift 02/27/24 1930     Nutrition Problem: Severe Malnutrition Etiology: chronic illness (ESRD-HD, CHF exacerbated by PNA)    Signs/Symptoms: severe fat depletion, severe muscle depletion    Interventions: Tube feeding, Prostat, Refer to RD note for recommendations, MVI  Estimated body mass index is 20.58 kg/m as calculated from the following:   Height as of this encounter: 5\' 5"  (1.651 m).   Weight as of this encounter: 56.1 kg.   DVT prophylaxis:  Code Status: DNR confirmed with patient and daughter Family Communication: Daughter who was at bedside Disposition Plan:  Status is: Inpatient     Consultants:  Nephrology CCM  Procedures:  Intubation Dialysis  Antimicrobials:  IV Unasyn   Subjective: Is alert, very soft speech, she will want to continue with treatments she told her daughter.  Objective: Vitals:   02/27/24 1900 02/27/24 2336 02/28/24 0313 02/28/24 0500  BP: 123/80 124/70 130/77   Pulse:  64 65   Resp: 18 16 19    Temp: (!) 94 F (34.4 C)     TempSrc: Rectal     SpO2:  98% 98%   Weight:    56.1 kg  Height:        Intake/Output Summary (Last 24 hours) at 02/28/2024 0732 Last data filed at 02/27/2024 1500 Gross per 24 hour  Intake 1835.22 ml  Output 2000 ml  Net -164.78 ml   Filed Weights   02/26/24 0500 02/27/24 1208 02/28/24 0500  Weight: 55.5 kg 53.5 kg 56.1 kg    Examination:  General exam: Chronic ill-appearing  Respiratory system: Clear to auscultation. Respiratory effort  normal. Cardiovascular system: S1 & S2 heard, RRR.  Gastrointestinal system: Abdomen is nondistended, soft and nontender. No organomegaly or masses felt. Normal bowel sounds heard. Central nervous system: Alert Extremities: Symmetric 5 x 5 power.    Data Reviewed: I have personally reviewed following labs and imaging studies  CBC: Recent Labs  Lab 02/24/24 0549 02/24/24 1838 02/24/24 2104 02/25/24 0506 02/25/24 0535 02/26/24 0448 02/27/24 0917  WBC 3.2* 7.5  --   --  10.7* 8.4 9.1  NEUTROABS 2.5 3.0  --   --   --  7.3 8.2*  HGB 7.3* 5.9* 7.1* 8.8* 8.3* 8.0* 8.5*  HCT 23.9* 20.0* 21.0* 26.0* 25.5* 24.5* 26.8*  MCV 93.4 96.2  --   --  90.4 89.4 91.2  PLT 41* 35*  --   --  39* 49* 62*   Basic Metabolic Panel: Recent Labs  Lab 02/24/24 0549 02/24/24 1838 02/24/24 2104 02/25/24 0506 02/25/24 0535 02/26/24 0448 02/27/24 0514  NA 135 136 136 135 135 131* 128*  K 4.4 3.3* 3.3* 3.7 3.6 3.6 3.7  CL 100 105  --   --  101 96* 94*  CO2 29 23  --   --  26 28 29   GLUCOSE 64* 175*  --   --  102* 68* 84  BUN 18 18  --   --  21 13 23   CREATININE 2.33* 2.36*  --   --  2.63* 1.78* 2.13*  CALCIUM  8.4* 7.3*  --   --  8.2* 7.6* 7.5*  MG  --  1.8  --   --   --  1.7 1.8  PHOS  --  3.0  --   --  2.9 2.2* 3.1   GFR: Estimated Creatinine Clearance: 21.5 mL/min (A) (by C-G formula based on SCr of 2.13 mg/dL (H)). Liver Function Tests: Recent Labs  Lab 02/24/24 0549 02/25/24 0535  AST 49*  --   ALT 15  --   ALKPHOS 73  --   BILITOT 1.1  --   PROT 6.4*  --   ALBUMIN  2.6* 2.2*   No results for input(s): "LIPASE", "AMYLASE" in the last 168 hours. No results for input(s): "AMMONIA" in the last 168 hours. Coagulation Profile: Recent Labs  Lab 02/23/24 2020  INR 1.2   Cardiac Enzymes: No results for input(s): "CKTOTAL", "CKMB", "CKMBINDEX", "TROPONINI" in the last 168 hours. BNP (last 3 results) No results for input(s): "PROBNP" in the last 8760 hours. HbA1C: No results for  input(s): "HGBA1C" in the last 72 hours. CBG: Recent Labs  Lab 02/27/24 0813 02/27/24 1134 02/27/24 1837 02/27/24 2000 02/27/24 2338  GLUCAP 77 96 107* 96 100*   Lipid Profile: No results for input(s): "CHOL", "HDL", "LDLCALC", "TRIG", "CHOLHDL", "LDLDIRECT" in the last 72 hours. Thyroid Function Tests: No results for input(s): "TSH", "T4TOTAL", "FREET4", "  T3FREE", "THYROIDAB" in the last 72 hours. Anemia Panel: No results for input(s): "VITAMINB12", "FOLATE", "FERRITIN", "TIBC", "IRON ", "RETICCTPCT" in the last 72 hours. Sepsis Labs: Recent Labs  Lab 02/24/24 1120 02/24/24 1838 02/24/24 2031 02/25/24 0534 02/25/24 0535  PROCALCITON  --   --   --  0.74  --   LATICACIDVEN 1.1 2.0* 2.9*  --  1.4    Recent Results (from the past 240 hours)  Resp panel by RT-PCR (RSV, Flu A&B, Covid) Anterior Nasal Swab     Status: None   Collection Time: 02/23/24  7:56 PM   Specimen: Anterior Nasal Swab  Result Value Ref Range Status   SARS Coronavirus 2 by RT PCR NEGATIVE NEGATIVE Final   Influenza A by PCR NEGATIVE NEGATIVE Final   Influenza B by PCR NEGATIVE NEGATIVE Final    Comment: (NOTE) The Xpert Xpress SARS-CoV-2/FLU/RSV plus assay is intended as an aid in the diagnosis of influenza from Nasopharyngeal swab specimens and should not be used as a sole basis for treatment. Nasal washings and aspirates are unacceptable for Xpert Xpress SARS-CoV-2/FLU/RSV testing.  Fact Sheet for Patients: BloggerCourse.com  Fact Sheet for Healthcare Providers: SeriousBroker.it  This test is not yet approved or cleared by the [place]  FDA and has been authorized for detection and/or diagnosis of SARS-CoV-2 by FDA under an Emergency Use Authorization (EUA). This EUA will remain in effect (meaning this test can be used) for the duration of the COVID-19 declaration under Section 564(b)(1) of the Act, 21 U.S.C. section 360bbb-3(b)(1), unless the  authorization is terminated or revoked.     Resp Syncytial Virus by PCR NEGATIVE NEGATIVE Final    Comment: (NOTE) Fact Sheet for Patients: BloggerCourse.com  Fact Sheet for Healthcare Providers: SeriousBroker.it  This test is not yet approved or cleared by the [place]  FDA and has been authorized for detection and/or diagnosis of SARS-CoV-2 by FDA under an Emergency Use Authorization (EUA). This EUA will remain in effect (meaning this test can be used) for the duration of the COVID-19 declaration under Section 564(b)(1) of the Act, 21 U.S.C. section 360bbb-3(b)(1), unless the authorization is terminated or revoked.  Performed at [facility], [address], [place], [place] [address]   Blood Culture (routine x 2)     Status: None (Preliminary result)   Collection Time: 02/23/24  8:01 PM   Specimen: BLOOD  Result Value Ref Range Status   Specimen Description BLOOD SITE NOT SPECIFIED  Final   Special Requests   Final    BOTTLES DRAWN AEROBIC AND ANAEROBIC Blood Culture results may not be optimal due to an inadequate volume of blood received in culture bottles   Culture   Final    NO GROWTH 3 DAYS Performed at [facility], [address], [place], [place] [address]    Report Status PENDING  Incomplete  Blood Culture (routine x 2)     Status: None (Preliminary result)   Collection Time: 02/23/24  8:20 PM   Specimen: BLOOD RIGHT ARM  Result Value Ref Range Status   Specimen Description BLOOD RIGHT ARM  Final   Special Requests   Final    BOTTLES DRAWN AEROBIC AND ANAEROBIC Blood Culture results may not be optimal due to an inadequate volume of blood received in culture bottles   Culture   Final    NO GROWTH 4 DAYS Performed at [facility], [address], [place], [place] [address]    Report Status PENDING  Incomplete  MRSA Next Gen by PCR, Nasal     Status: None   Collection Time: 02/24/24  6:51 PM    Specimen: Nasal Mucosa; Nasal Swab  Result Value Ref Range Status   MRSA by PCR Next Gen NOT DETECTED NOT DETECTED Final    Comment: (NOTE) The GeneXpert MRSA Assay (FDA approved for NASAL specimens only), is one component of a comprehensive MRSA colonization surveillance program. It is not intended to diagnose MRSA infection nor to guide or monitor treatment for MRSA infections. Test performance is not FDA approved in patients less than 53 years old. Performed at [facility], [address], [place], [place] [address]          Radiology Studies: No results found.      Scheduled Meds:  cadexomer iodine    Topical Daily   Chlorhexidine  Gluconate Cloth  6 each Topical Q0600   feeding supplement  1 Container Oral TID BM   leptospermum manuka honey  1 Application Topical Daily   midodrine   10 mg Oral TID WC   multivitamin  1 tablet Oral Once per day on Monday Wednesday Friday Saturday   mouth rinse  15 mL Mouth Rinse 4 times per day   pantoprazole   40 mg Oral Daily   polyethylene glycol  17 g Oral Daily   sodium chloride  flush  10-40 mL Intracatheter Q12H   thiamine   100 mg Oral Daily   Continuous Infusions:  ampicillin -sulbactam (UNASYN ) IV Stopped (02/26/24 2149)   anticoagulant sodium citrate      dextrose  75 mL/hr at 02/27/24 1755     LOS: 5 days    Time spent: 35 minutes    Taneeka Curtner A Deneise Getty, MD [facility]   If 7PM-7AM, please contact night-coverage [email or web address]  02/28/2024, 7:32 AM

## 2024-02-28 NOTE — Plan of Care (Signed)

## 2024-02-28 NOTE — Progress Notes (Signed)
 IV team to bedside to assess for new vascular access. Left arm restricted d/t AV fistula. Right arm is severely swollen and weeping. Discussed DVT study and recommend maintaining central access at this time with MD Regalado and RN Cynda Drafts. Arm elevated. Family at bedside.

## 2024-02-28 NOTE — Progress Notes (Signed)
 [facility] Progress Note  Subjective:  Moved to a floor bed Family at bedside Pt w/o c/o's  Vitals:   02/27/24 2336 02/28/24 0313 02/28/24 0500 02/28/24 0930  BP: 124/70 130/77    Pulse: 64 65    Resp: 16 19    Temp:    97.9 F (36.6 C)  TempSrc:    Oral  SpO2: 98% 98%    Weight:   56.1 kg   Height:        Exam:  alert, nad   no jvd  Chest cta bilat  Cor reg no RG  Abd soft ntnd no ascites   Ext no LE edema   Alert, NF, ox3     LUE AVF+bruit       Renal-related home meds: Coreg  3.125 Mg twice daily on nondialysis days; evening dose on dialysis days Midodrine  10 mg as needed and pre HD TTS Rena-Vite 1 daily Others: PPI, Remeron , Eliquis       OP HD: [facility] 3h  B350   46.1kg  3K bath  L AVF  Heparin  4000       Assessment/ Plan: Septic shock/ aspiration PNA: getting IV unasyn , IV steroids. Off pressors, on midodrine  10 tid.  Acute hypoxic resp failure: better, off O2 on room air. 2 L off w/ HD #2 yesterday.  Prob adrenal insufficiency: causing low BS, bradycardia. Getting IV steroids ESRD: on HD TTS. Had HD here Wed and Friday off schedule. Next HD Monday.  BP: bp's stable today, 120/80 range.  Volume: +LE edema which looks chronic. Wts are off. Stable clinically.  Anemia of esrd: Hb 7-9, follow, transfuse as needed Secondary hyperparathyroidism: CCa in range, continue binders with meals Chronic left foot wound Chronic pancytopenia H/o PE: from march 2025, on eliquis         [employer] MD  [facility] 02/28/2024, 10:46 AM  Recent Labs  Lab 02/24/24 0549 02/24/24 1838 02/25/24 0535 02/26/24 0448 02/27/24 0514 02/27/24 0917  HGB 7.3*   < > 8.3* 8.0*  --  8.5*  ALBUMIN  2.6*  --  2.2*  --   --   --   CALCIUM  8.4*   < > 8.2* 7.6* 7.5*  --   PHOS  --    < > 2.9 2.2* 3.1  --   CREATININE 2.33*   < > 2.63* 1.78* 2.13*  --   K 4.4   < > 3.6 3.6 3.7  --    < > = values in this interval not displayed.   No results for input(s): "IRON ",  "TIBC", "FERRITIN" in the last 168 hours. Inpatient medications:  cadexomer iodine    Topical Daily   Chlorhexidine  Gluconate Cloth  6 each Topical Q0600   feeding supplement  1 Container Oral TID BM   leptospermum manuka honey  1 Application Topical Daily   midodrine   10 mg Oral TID WC   multivitamin  1 tablet Oral Once per day on Monday Wednesday Friday Saturday   mouth rinse  15 mL Mouth Rinse 4 times per day   pantoprazole   40 mg Oral Daily   polyethylene glycol  17 g Oral Daily   sodium chloride  flush  10-40 mL Intracatheter Q12H   thiamine   100 mg Oral Daily    ampicillin -sulbactam (UNASYN ) IV Stopped (02/26/24 2149)   anticoagulant sodium citrate      dextrose  75 mL/hr at 02/28/24 0739   alteplase , anticoagulant sodium citrate , heparin , heparin , ipratropium-albuterol , lidocaine  (PF), lidocaine -prilocaine , mouth rinse, pentafluoroprop-tetrafluoroeth, phenol, polyethylene  glycol, sodium chloride  flush

## 2024-02-28 NOTE — Progress Notes (Signed)
 Patient has refused all medications, treatments, mobility and lab work for the shift.  Stated that she wants her HD cath removed.  Explained that we must have an order to do so.  Family at bedside, Will continue to monitor.

## 2024-02-28 NOTE — Progress Notes (Signed)
   02/28/24 1540  Spiritual Encounters  Type of Visit Initial  Care provided to: Pt and family  Conversation partners present during encounter Nurse  Referral source Patient request;Nurse (RN/NT/LPN)  Reason for visit Routine spiritual support  OnCall Visit Yes  Spiritual Framework  Presenting Themes Goals in life/care;Values and beliefs;Community and relationships  Community/Connection Family;Faith community  Patient Stress Factors Health changes  Family Stress Factors Health changes  Interventions  Spiritual Care Interventions Made Prayer;Compassionate presence;Established relationship of care and support   Chaplain responded to a request to visit with the patient who is discerning goals of care. Chaplain met with patient and daughter at bedside. Chaplain introduced spiritual care services. Chaplain offered prayer and support.Spiritual care services available as needed.   Marion Sicilian, Chaplain 02/28/24

## 2024-02-28 NOTE — Progress Notes (Signed)
 Daily Progress Note   Patient Name: Kara Mullins       Date: 02/28/2024 DOB: [birth year]  Age: 73 y.o. MRN#: [id number] Attending Physician: Danette Duos, MD Primary Care Physician: Estil Heman, NP Admit Date: 02/23/2024  Reason for Consultation/Follow-up: Establishing goals of care  Subjective: Denies complaints  Length of Stay: 5  Current Medications: Scheduled Meds:   sodium chloride    Intravenous Once   cadexomer iodine    Topical Daily   Chlorhexidine  Gluconate Cloth  6 each Topical Q0600   feeding supplement  1 Container Oral TID BM   leptospermum manuka honey  1 Application Topical Daily   midodrine   10 mg Oral TID WC   multivitamin  1 tablet Oral Once per day on Monday Wednesday Friday Saturday   mouth rinse  15 mL Mouth Rinse 4 times per day   pantoprazole   40 mg Oral Daily   polyethylene glycol  17 g Oral Daily   sodium chloride  flush  10-40 mL Intracatheter Q12H   thiamine   100 mg Oral Daily    Continuous Infusions:  ampicillin -sulbactam (UNASYN ) IV Stopped (02/26/24 2149)   anticoagulant sodium citrate      dextrose  50 mL/hr at 02/28/24 1321    PRN Meds: alteplase , anticoagulant sodium citrate , heparin , heparin , ipratropium-albuterol , lidocaine  (PF), lidocaine -prilocaine , mouth rinse, pentafluoroprop-tetrafluoroeth, phenol, polyethylene glycol, sodium chloride  flush  Physical Exam Constitutional:      General: She is not in acute distress.    Appearance: She is ill-appearing.  Pulmonary:     Effort: Pulmonary effort is normal.  Skin:    General: Skin is warm and dry.  Neurological:     Mental Status: She is oriented to person, place, and time.             Vital Signs: BP 104/60 (BP Location: Right Wrist)   Pulse 69   Temp (!) 97.5 F (36.4 C)  (Oral)   Resp 18   Ht 5\' 5"  (1.651 m)   Wt 56.1 kg   SpO2 97%   BMI 20.58 kg/m  SpO2: SpO2: 97 % O2 Device: O2 Device: Room Air O2 Flow Rate: O2 Flow Rate (L/min): 1 L/min  Intake/output summary:  Intake/Output Summary (Last 24 hours) at 02/28/2024 1338 Last data filed at 02/27/2024 1500 Gross per 24 hour  Intake 510.1 ml  Output --  Net 510.1 ml   LBM: Last BM Date : 02/27/24 Baseline Weight: Weight: 49.4 kg Most recent weight: Weight: 56.1 kg       Palliative Assessment/Data: PPS 40%      Patient Active Problem List   Diagnosis Date Noted   ESRD (end stage renal disease) (HCC) 02/25/2024   Bradycardia 02/23/2024   Dyspnea, unspecified 01/19/2024   Malnutrition of moderate degree 01/15/2024   Community acquired pneumonia 01/12/2024   Aspiration pneumonia of left lower lobe (HCC) 01/11/2024   Acute on chronic respiratory failure with hypoxia (HCC) 12/13/2023   Pleural effusion 12/13/2023   Heel ulcer (HCC) 12/13/2023   Influenza A 12/13/2023   PVD (peripheral vascular disease) (HCC) 12/13/2023   Thrombocytopenia (HCC) 12/13/2023   ESRD on hemodialysis (HCC) 06/11/2023   Chronic idiopathic thrombocytopenia (HCC) 06/11/2023   End stage renal  disease (HCC) 04/11/2023   Secondary hyperparathyroidism of renal origin (HCC) 04/09/2023   Dependence on renal dialysis (HCC) 03/31/2023   Hypertensive heart and chronic kidney disease with heart failure and with stage 5 chronic kidney disease, or end stage renal disease (HCC) 03/31/2023   Acute on chronic diastolic (congestive) heart failure (HCC) 03/22/2023   Anemia of chronic disease 03/22/2023   Sacral wound 03/22/2023   Acute on chronic diastolic CHF (congestive heart failure) (HCC) 02/26/2023   Cardiorenal syndrome 02/25/2023   Chronic kidney disease (CKD), stage IV (severe) (HCC) 02/25/2023   Hypoglycemia 08/15/2022   Hypertensive emergency 08/15/2022   Obesity (BMI 30-39.9) 08/15/2022   Acute on chronic diastolic  heart failure (HCC) 16/07/9603   AKI (acute kidney injury) (HCC)    (HFpEF) heart failure with preserved ejection fraction (HCC) 06/02/2022   Acute respiratory distress 06/02/2022   Bilateral lower extremity edema 06/02/2022   Hypocalcemia 06/02/2022   Subclinical hypothyroidism 06/02/2022   Transaminitis 06/02/2022   Acute kidney injury superimposed on chronic kidney disease (HCC) 03/28/2022   Anasarca associated with disorder of kidney 03/27/2022   Leukopenia 03/27/2022   Aortic atherosclerosis (HCC) 03/27/2022   Pressure injury of skin 01/02/2022   Hypotension 12/30/2021   Pneumonia due to COVID-19 virus 12/26/2021   Palliative care by specialist    Goals of care, counseling/discussion    General weakness    Sepsis (HCC) 12/22/2021   COVID-19 virus infection 12/22/2021   Paroxysmal atrial fibrillation (HCC) 12/22/2021   CKD (chronic kidney disease) stage 5, GFR less than 15 ml/min (HCC) 12/22/2021   Chronic anemia 12/22/2021   Prolonged QT interval 12/22/2021   Debility 11/20/2021   Metabolic acidosis 11/20/2021   Severe sepsis (HCC) 11/19/2021   Hypothermia 11/09/2021   Hyperlipidemia 11/09/2021   Chronic combined systolic and diastolic congestive heart failure (HCC) 11/09/2021   Pancytopenia (HCC) 11/09/2021   Severe protein-calorie malnutrition (HCC) 11/09/2021   Acute combined systolic and diastolic HF (heart failure) (HCC)    Acute exacerbation of CHF (congestive heart failure) (HCC) 03/06/2021   Essential hypertension    Hypertensive urgency 03/05/2021   Chronic diastolic CHF (congestive heart failure) (HCC) 03/05/2021   Nausea and vomiting 03/05/2021   Elevated troponin level not due myocardial infarction 03/05/2021   Elevated d-dimer 03/05/2021   Acute congestive heart failure (HCC) 03/05/2021    Palliative Care Assessment & Plan   HPI: A 73 yr old female with ESRD on HD on TTS (AVF), diagnosed PE (01/2024) on Eliquis , Anemia of chronic illness, chronic  pancytopenia, chronic diastolic dysfunction (01/12/2024: Echo EF 60%, G1DD, RVSP 31.3 mmHg), PVD, nonhealing left heel ulcer, chronic hypotension since last admission (late March 2025), needed midodrine , PCM, deconditioned, bed bound/wheelchair bound, poor oral intake, and recently admitted to the hospital twice first time for pneumonia from influenza then following which patient also was admitted for pneumonia from aspiration discharged on 01/18/2024.    Seen multiple times by PMT in the past.  The Palliative care team is involved to further facilitate goals of care conversations in the setting of severe septic shock..  Assessment: Follow up today with Kara Mullins and her daughter Kara Mullins at bedside. Reviewed status with RN as well - Overnight Kara Mullins expressed that she was not interested in continuing aggressive medical care, did not want HD, wanted comfort measures only - this remained consistent this morning. Later in the morning patient changed her mind and shared she did want to continue with current medical care to include HD but continue  DNR status.   During my visit Kara Mullins is tired and so interaction with her is minimal. Terra and I review events earlier in the day. Kara Mullins shares that patient expressed the same feelings - wanting to stop everything - only to change her mind later during a previous hospitalization. Kara Mullins shares that the regularly checks in with her mother about her care; she regularly asks her about her quality of life and what she finds acceptable. She also regular asks about continuing HD. Kara Mullins tells me her mother is typically very strong willed and knows what she wants and is able to express that well. Kara Mullins agrees with patient's current desires to continue current medical care along with HD. Kara Mullins will continue conversations with her mother regarding goals of care. She is agreeable to PMT following along this hospitalization.   Reached out again later to [facility] to ask about  an outpatient palliative referral to continue GOC conversations given patient's fluctuating goals. Kara Mullins shares they did see outpatient palliative in the past - twice in 2023 but then these services stopped. I offered this as extra support to [facility] in case patient changes her mind again. Kara Mullins is agreeable. TOC order placed for outpatient palliative referral.   Recommendations/Plan: Goals have fluctuated but currently patient and daughter agree to continue current measures including HD DNR/DNI Will refer to outpatient palliative PMT to follow along during hospitalization  Code Status: DNR  Care plan was discussed with patient's daughter and RN  Thank you for allowing the Palliative Medicine Team to assist in the care of this patient.   Total Time 40 minutes Prolonged Time Billed  no   Time spent includes: Detailed review of medical records (labs, imaging, vital signs), medically appropriate exam, discussion with treatment team, counseling and educating patient, family and/or staff, documenting clinical information, medication management and coordination of care.     *Please note that this is a verbal dictation therefore any spelling or grammatical errors are due to the "Dragon Medical One" system interpretation.  Alvino Aye, DNP, [facility] Palliative Medicine Team Team Phone # [phone]  Pager [phone]

## 2024-02-28 NOTE — Progress Notes (Signed)
 Per night shift RN report, patient and daughter wished to proceed with comfort/hospice care. Confirmed with patient and her daughter at beginning of shift and paged attending, Regalado MD. I was called into the room 2 hours later by the patients daughter and she informed me the patient had changed her mind and said the patient stated she wants to "stay on the earth." I had a lengthy discussion with the patient about current treatment and quality of life. The patient wants to continue with all treatment and keep DNR status. Regalado MD notified.

## 2024-02-28 NOTE — Evaluation (Signed)
 Clinical/Bedside Swallow Evaluation Patient Details  Name: Kara Mullins MRN: [id number] Date of Birth: 23-Sep-1951  Today's Date: 02/28/2024 Time: SLP Start Time (ACUTE ONLY): 1315 SLP Stop Time (ACUTE ONLY): 1327 SLP Time Calculation (min) (ACUTE ONLY): 12 min  Past Medical History:  Past Medical History:  Diagnosis Date   Chronic combined systolic and diastolic congestive heart failure (HCC) 11/09/2021   ESRD (end stage renal disease) on dialysis (HCC)    Generalized anxiety disorder    Hyperlipidemia 11/09/2021   Hypertension    PE (pulmonary thromboembolism) (HCC)    Past Surgical History:  Past Surgical History:  Procedure Laterality Date   AV FISTULA PLACEMENT Left 03/27/2023   Procedure: LEFT ARM BRACHIOCEPHALIC ARTERIOVENOUS (AV) FISTULA CREATION;  Surgeon: Carlene Che, MD;  Location: [facility] OR;  Service: Vascular;  Laterality: Left;   CATARACT EXTRACTION, BILATERAL     combined systolic and diastolic congestive heart failure      INSERTION OF DIALYSIS CATHETER N/A 03/27/2023   Procedure: INSERTION OF RIGHT INTERNAL JUGULAR TUNNELED DIALYSIS CATHETER;  Surgeon: Carlene Che, MD;  Location: [facility] OR;  Service: Vascular;  Laterality: N/A;   IR THORACENTESIS ASP PLEURAL SPACE W/IMG GUIDE  06/12/2023   HPI:  Patient is a 73 y.o. female with PMH: ESRD on HD, PE in 2025, anemia of chronic illness, dysphagia, chronic hypotension, heart failure, HTN, FTT. She has been admitted several times in the past few months. During admission in March of 2025, SLP evaluated her swallow function via an MBS which reported concern for primary esophageal dysphagia due to esophagus appearing to be fairly filled with barium with retrograde flow noted below PES. Patient was seen by SLP for BSE during previous admissions 01/13/24 and current admission 02/24/24 and both times, SLP suspecting primary esophageal dysphagia and recommending esophageal assessment versus GI consult. Patient was then intubated  on 5/13 and ultimately extubated on 5/15.    Assessment / Plan / Recommendation  Clinical Impression  Patient presents with clinical s/s of dysphagia as per this bedside swallow evaluation. Her daughter was feeding her spaghetti from her lunch tray when SLP arrived. Daughter did indicate that she tried to order pureed carrots but was told she could not since patient is on a regular solids diet. Paient is edentulous. Patient herself would primarily communicate with gestures and the only time she verbalized was when SLP or daughter prompted her to do so. Daughter feels that she is still exhibiting confusion. Patient exhibits prolonged mastication of solids but with timely swallow initiation. No overt s/s aspiration with solids or with straw sips of thin liquids (water). SLP discussed options of downgrading to a puree or minced texture solids versus keeping current regular consistency diet to allow more options. Daughter in agreement with this and she plans to order foods from hospital menu that are softer and easier to masticate. SLP will follow briefly to ensure toleration of PO's. SLP Visit Diagnosis: Dysphagia, unspecified (R13.10)    Aspiration Risk  Mild aspiration risk    Diet Recommendation Regular;Thin liquid    Liquid Administration via: Cup;Straw Medication Administration: Whole meds with liquid Supervision: Full supervision/cueing for compensatory strategies;Staff to assist with self feeding Compensations: Slow rate;Small sips/bites Postural Changes: Seated upright at 90 degrees;Remain upright for at least 30 minutes after po intake    Other  Recommendations Recommended Consults: Consider esophageal assessment Oral Care Recommendations: Oral care BID    Recommendations for follow up therapy are one component of a multi-disciplinary discharge planning process, led  by the attending physician.  Recommendations may be updated based on patient status, additional functional criteria and  insurance authorization.  Follow up Recommendations Other (comment) (TBD)      Assistance Recommended at Discharge    Functional Status Assessment Patient has had a recent decline in their functional status and demonstrates the ability to make significant improvements in function in a reasonable and predictable amount of time.  Frequency and Duration min 2x/week  1 week       Prognosis Prognosis for improved oropharyngeal function: Fair Barriers to Reach Goals: Cognitive deficits;Time post onset      Swallow Study   General Date of Onset: 02/28/24 HPI: Patient is a 73 y.o. female with PMH: ESRD on HD, PE in 2025, anemia of chronic illness, dysphagia, chronic hypotension, heart failure, HTN, FTT. She has been admitted several times in the past few months. During admission in March of 2025, SLP evaluated her swallow function via an MBS which reported concern for primary esophageal dysphagia due to esophagus appearing to be fairly filled with barium with retrograde flow noted below PES. Patient was seen by SLP for BSE during previous admissions 01/13/24 and current admission 02/24/24 and both times, SLP suspecting primary esophageal dysphagia and recommending esophageal assessment versus GI consult. Patient was then intubated on 5/13 and ultimately extubated on 5/15. Type of Study: Bedside Swallow Evaluation Previous Swallow Assessment: see HPI, MBS 12/15/23, BSE 02/24/24 Diet Prior to this Study: Regular;Thin liquids (Level 0) Respiratory Status: Nasal cannula History of Recent Intubation: Yes Total duration of intubation (days): 3 days Date extubated: 02/26/24 Behavior/Cognition: Alert;Confused Oral Cavity Assessment: Within Functional Limits Oral Care Completed by SLP: No Oral Cavity - Dentition: Edentulous Self-Feeding Abilities: Total assist Patient Positioning: Upright in bed Baseline Vocal Quality: Low vocal intensity;Hoarse Volitional Cough: Cognitively unable to elicit Volitional  Swallow: Unable to elicit    Oral/Motor/Sensory Function Overall Oral Motor/Sensory Function: Within functional limits   Ice Chips     Thin Liquid Thin Liquid: Within functional limits Presentation: Straw    Nectar Thick     Honey Thick     Puree Puree: Not tested   Solid     Solid: Impaired Oral Phase Functional Implications: Impaired mastication;Prolonged oral transit      Jacqualine Mater, MA, CCC-SLP Speech Therapy

## 2024-02-29 DIAGNOSIS — Z515 Encounter for palliative care: Secondary | ICD-10-CM | POA: Diagnosis not present

## 2024-02-29 LAB — BPAM RBC
Blood Product Expiration Date: 202506142359
ISSUE DATE / TIME: 202505172041
Unit Type and Rh: 8400

## 2024-02-29 LAB — TYPE AND SCREEN
ABO/RH(D): AB POS
Antibody Screen: NEGATIVE
Unit division: 0

## 2024-02-29 LAB — GLUCOSE, CAPILLARY
Glucose-Capillary: 100 mg/dL — ABNORMAL HIGH (ref 70–99)
Glucose-Capillary: 105 mg/dL — ABNORMAL HIGH (ref 70–99)
Glucose-Capillary: 75 mg/dL (ref 70–99)
Glucose-Capillary: 98 mg/dL (ref 70–99)

## 2024-02-29 LAB — BASIC METABOLIC PANEL WITH GFR
Anion gap: 6 (ref 5–15)
BUN: 20 mg/dL (ref 8–23)
CO2: 25 mmol/L (ref 22–32)
Calcium: 7.5 mg/dL — ABNORMAL LOW (ref 8.9–10.3)
Chloride: 89 mmol/L — ABNORMAL LOW (ref 98–111)
Creatinine, Ser: 2 mg/dL — ABNORMAL HIGH (ref 0.44–1.00)
GFR, Estimated: 26 mL/min — ABNORMAL LOW (ref >60)
Glucose, Bld: 103 mg/dL — ABNORMAL HIGH (ref 70–99)
Potassium: 3.8 mmol/L (ref 3.5–5.1)
Sodium: 120 mmol/L — ABNORMAL LOW (ref 135–145)

## 2024-02-29 LAB — CULTURE, BLOOD (ROUTINE X 2): Culture: NO GROWTH

## 2024-02-29 LAB — TSH: TSH: 1.699 u[IU]/mL (ref 0.350–4.500)

## 2024-02-29 MED ORDER — ATROPINE SULFATE 1 MG/10ML IJ SOSY
PREFILLED_SYRINGE | INTRAMUSCULAR | Status: AC
  Filled 2024-02-29: qty 10

## 2024-03-01 ENCOUNTER — Encounter: Payer: Medicare (Managed Care) | Admitting: Physical Therapy

## 2024-03-01 LAB — GLUCOSE, CAPILLARY: Glucose-Capillary: 87 mg/dL (ref 70–99)

## 2024-03-09 ENCOUNTER — Ambulatory Visit: Payer: Medicare (Managed Care) | Admitting: Vascular Surgery

## 2024-03-14 NOTE — Progress Notes (Signed)
 Kara Mullins [phone] [facility]?Hospital Liaison Note:??   Notified by Alvino Aye, NP of family request for [facility] Palliative services at home after discharge.????????   ????   Hospital liaison will follow patient for discharge disposition.?   ????   Please call with any hospice or outpatient palliative care related questions.???   ????   Thank you for the opportunity to participate in this patient's care.   Jacqlyn Matas, BSN RN [facility] Liaison [phone]

## 2024-03-14 NOTE — Death Summary Note (Signed)
 DEATH SUMMARY   Patient Details  Name: Kara Mullins MRN: [id number] DOB: 06-06-1951 WUJ:[id number], Elijio [place], NP Admission/Discharge Information   Admit Date:  March 16, 2024  Date of Death:  22-Mar-2024  Time of Death:  10:56 AM  Length of Stay: 6   Principle Cause of death: Sepsis, Aspiration PNA.   Hospital Diagnoses: Principal Problem:   Hypothermia Active Problems:   Sepsis (HCC)   ESRD on hemodialysis (HCC)   Essential hypertension   Pancytopenia (HCC)   Heel ulcer (HCC)   PVD (peripheral vascular disease) (HCC)   Paroxysmal atrial fibrillation (HCC)   Bradycardia   ESRD (end stage renal disease) [facility])   Hospital Course: 73 year old with past medical history significant for ESRD on hemodialysis TTS, diagnosed PE/2025 on Eliquis , anemia chronic illness, chronic pancytopenia, chronic diastolic dysfunction, PVD, nonhealing left heel ulcer, chronic hypotension since last admission March 2025, midodrine , deconditioning, bedbound, wheelchair-bound, recent 2 hospitalizations so far, presented with hypothermia, hypotension, bradycardia, hypoglycemia.  No specific complaints.  She was admitted received IV steroids IV bolus, IV antibiotic.  CCM was consulted and as they were entering the room to evaluate patient CODE BLUE was activated.  Heart rate was 30s oxygen 61 agonal breathing.  Patient was intubated received IV epi.  She did not lost pulse.   03-16-2024: admission to hospitalist team 5/13: Intubated on the floor due to hypotension, unresponsiveness, transferred to ICU  5/14 off pressors.  Received hemodialysis  5/15 extubated   Patient was stable this morning, nurse check on her within the 10 minutes alarm sound and patient heart rate was in the 30s.  On evaluation patient had PEA.  No pulse, she was not breathing, episode was very past.  She was suction, CBG was normal 130s, she was placed on oxygen.  Noticed patient was a DNR DNI in the event of cardiac arrest.  Family was  contacted, support was provided   Assessment and Plan: 1-Sepsis with Septic Shock in the setting of Aspiration Pneumonia: - Patient required IV vasopressors, IV antibiotics.  Was on IV steroid which has been discontinued. - Continue with IV Unasyn  - Blood pressure stable continue with midodrine  - Cultures no growth to date.   Acute hypoxic respiratory failure Patient was intubated on the floor due to hypotension, unresponsiveness and transferred to the ICU Suspect  related to aspiration pneumonia Speech evaluation.    Probably relative adrenal insufficiency Sepsis show could have led to to hypoglycemia and bradycardia Cortisol level was 11.5 on admission Has been weaning off of IV steroids by ICU team   ESRD on hemodialysis Nephrology is following Patient said this morning that she wished to continue with hemodialysis   Hyponatremia; labs order to be repeated. She was asymptomatic.   History of subacute PE 2025 - Eliquis  was on hold due to anemia and Thrombocytopenia.   Anemi patient received 1 unit of packed red blood cell 5/17.  Hemoglobin was pending Chronic pancytopenia Platelet count improving, continue to monitor hemoglobin   Chronic heart failure preserved ejection fraction: - Volume managed with hemodialysis   Severe protein caloric malnutrition - Started  Boost   Hypoglycemia  could be in the setting of poor oral intake getting D10   Chronic nonhealing ulcer left heel, POA - Evaluated by podiatry 5/13.  Will recommend no surgical plans.  Continue local wound care. No candidate for revascularization per recent [facility] notes. -Broad spectrum p.o. oral antibiotic on DC for 2 weeks.   Dysphagia:  Previous  MBS showed contrast  retain esophagus.  Esophagogram, was ordered.    Acute metabolic encephalopathy in the setting of hypoglycemia and hypotension Mental Status stable.                The results of significant diagnostics from this hospitalization  (including imaging, microbiology, ancillary and laboratory) are listed below for reference.   Significant Diagnostic Studies: US  RENAL Result Date: 02/25/2024 CLINICAL DATA:  Sepsis. EXAM: RENAL / URINARY TRACT ULTRASOUND COMPLETE COMPARISON:  August 15, 2022 FINDINGS: Right Kidney: Renal measurements: 8.8 cm x 3.9 cm x 4.6 cm = volume: 82.16 mL. Diffusely increased echogenicity of the renal parenchyma is noted. No mass or hydronephrosis visualized. Left Kidney: Renal measurements: 7.7 cm x 4.0 cm x 3.8 cm = volume: 61.52 mL. Diffusely increased echogenicity of the renal parenchyma is noted. No mass or hydronephrosis visualized. Bladder: Appears normal for degree of bladder distention. Other: Of incidental note is the presence of a 5.5 cm x 4.7 cm x 6.4 cm left ovarian cyst. A small amount of ascites is seen within the right upper quadrant. IMPRESSION: 1. Bilateral echogenic kidneys which may represent sequelae associated with medical renal disease. 2. Large left ovarian cyst. 3. Small amount of ascites. Electronically Signed   By: Virgle Grime M.D.   On: 02/25/2024 01:03   DG Chest Port 1 View Result Date: 02/24/2024 CLINICAL DATA:  [id number] Encounter for central line placement [id number] EXAM: PORTABLE CHEST 1 VIEW COMPARISON:  Chest x-ray 12/25/2023 7:23 p.m. FINDINGS: Endotracheal tube terminates 3.5 cm above the carina. Enteric tube courses below the hemidiaphragm with tip collimated off view and side port overlying the expected region the gastric lumen. Interval placement of an right internal jugular central venous Cordis with tip overlying the superior caval junction. Cardiac paddles overlie the chest. The heart and mediastinal contours are within normal limits. No focal consolidation. Mild pulmonary edema. At least trace bilateral pleural effusion. No definite pneumothorax. Lung markings noted at both apices. No acute osseous abnormality. IMPRESSION: 1. Mild pulmonary edema with bilateral at least  trace pleural effusions. 2. Lines and tubes as above. Interval placement of an right internal jugular central venous catheter in appropriate position. Electronically Signed   By: Morgane  Naveau M.D.   On: 02/24/2024 22:51   DG CHEST PORT 1 VIEW Result Date: 02/24/2024 CLINICAL DATA:  [id number] Post-operative state [id number], intubation EXAM: PORTABLE CHEST 1 VIEW COMPARISON:  Chest x-ray 02/23/2024 FINDINGS: Endotracheal tube with tip terminating 4 cm above the carina. Enteric tube coursing below the hemidiaphragm with tip collimated off view and side port overlying the expected region the gastric lumen. Cardiac paddles overlie the chest. The heart and mediastinal contours are within normal limits. No focal consolidation. Mild pulmonary edema. At least trace volume bilateral pleural effusion. No pneumothorax. No acute osseous abnormality. IMPRESSION: 1. Mild pulmonary edema. 2. At least trace volume bilateral pleural effusions. 3. Endotracheal and enteric tubes in good position. Electronically Signed   By: Morgane  Naveau M.D.   On: 02/24/2024 19:37   CT FOOT LEFT WO CONTRAST Result Date: 02/24/2024 CLINICAL DATA:  Forefoot swelling, evaluate for possible osteomyelitis EXAM: CT OF THE LEFT FOOT WITHOUT CONTRAST TECHNIQUE: Multidetector CT imaging of the left foot was performed according to the standard protocol. Multiplanar CT image reconstructions were also generated. RADIATION DOSE REDUCTION: This exam was performed according to the departmental dose-optimization program which includes automated exposure control, adjustment of the mA and/or kV according to patient size and/or use of iterative reconstruction technique. COMPARISON:  Plain  film from the previous day. FINDINGS: Bones/Joint/Cartilage Distal tibia and fibula show no evidence of acute fracture. Mottled areas of decreased attenuation are noted throughout the bony structures likely related to some disuse osteopenia. No acute fracture is seen. Degenerative  changes in the tarsal bones are noted. Soft tissue wound is noted posterior to the calcaneus with some cortical irregularity suspicious for very early osteomyelitis. No other erosive changes are seen. Ligaments Suboptimally assessed by CT. Muscles and Tendons Surrounding musculature appears within normal limits. Soft tissues Generalized soft tissue swelling is seen. Soft tissue wound is noted along the posterior lateral aspect of the calcaneus with some cortical irregularity consistent with early osteomyelitis. IMPRESSION: Changes consistent with early osteomyelitis in the posterolateral aspect of the calcaneus. Electronically Signed   By: Violeta Grey M.D.   On: 02/24/2024 02:53   DG Chest Port 1 View Result Date: 02/23/2024 CLINICAL DATA:  Possible sepsis EXAM: PORTABLE CHEST 1 VIEW COMPARISON:  01/13/2024 FINDINGS: Cardiac shadow is mildly enlarged but stable. Aortic calcifications are seen. Small bilateral pleural effusions are noted stable from the prior exam. Central vascular congestion is noted without significant edema. IMPRESSION: Changes of mild CHF. Electronically Signed   By: Violeta Grey M.D.   On: 02/23/2024 20:42   DG Foot 2 Views Left Result Date: 02/23/2024 CLINICAL DATA:  Possible sepsis. Question osteomyelitis of the left foot. EXAM: LEFT FOOT - 2 VIEW COMPARISON:  12/13/2023. FINDINGS: Osteopenia. Forefoot soft tissue swelling. No osseous erosions to suggest osteomyelitis. Mild degenerative changes of the first metatarsophalangeal joint. Calcifications along the plantar aspect of the foot. IMPRESSION: Forefoot soft tissue swelling.  No evidence of osteomyelitis. Electronically Signed   By: Shearon Denis M.D.   On: 02/23/2024 20:42    Microbiology: Recent Results (from the past 240 hours)  Resp panel by RT-PCR (RSV, Flu A&B, Covid) Anterior Nasal Swab     Status: None   Collection Time: 02/23/24  7:56 PM   Specimen: Anterior Nasal Swab  Result Value Ref Range Status   SARS  Coronavirus 2 by RT PCR NEGATIVE NEGATIVE Final   Influenza A by PCR NEGATIVE NEGATIVE Final   Influenza B by PCR NEGATIVE NEGATIVE Final    Comment: (NOTE) The Xpert Xpress SARS-CoV-2/FLU/RSV plus assay is intended as an aid in the diagnosis of influenza from Nasopharyngeal swab specimens and should not be used as a sole basis for treatment. Nasal washings and aspirates are unacceptable for Xpert Xpress SARS-CoV-2/FLU/RSV testing.  Fact Sheet for Patients: BloggerCourse.com  Fact Sheet for Healthcare Providers: SeriousBroker.it  This test is not yet approved or cleared by the [place]  FDA and has been authorized for detection and/or diagnosis of SARS-CoV-2 by FDA under an Emergency Use Authorization (EUA). This EUA will remain in effect (meaning this test can be used) for the duration of the COVID-19 declaration under Section 564(b)(1) of the Act, 21 U.S.C. section 360bbb-3(b)(1), unless the authorization is terminated or revoked.     Resp Syncytial Virus by PCR NEGATIVE NEGATIVE Final    Comment: (NOTE) Fact Sheet for Patients: BloggerCourse.com  Fact Sheet for Healthcare Providers: SeriousBroker.it  This test is not yet approved or cleared by the [place]  FDA and has been authorized for detection and/or diagnosis of SARS-CoV-2 by FDA under an Emergency Use Authorization (EUA). This EUA will remain in effect (meaning this test can be used) for the duration of the COVID-19 declaration under Section 564(b)(1) of the Act, 21 U.S.C. section 360bbb-3(b)(1), unless the authorization is terminated or  revoked.  Performed at [facility], [address], [place], [place] [address]   Blood Culture (routine x 2)     Status: None   Collection Time: 02/23/24  8:01 PM   Specimen: BLOOD  Result Value Ref Range Status   Specimen Description BLOOD SITE NOT SPECIFIED   Final   Special Requests   Final    BOTTLES DRAWN AEROBIC AND ANAEROBIC Blood Culture results may not be optimal due to an inadequate volume of blood received in culture bottles   Culture   Final    NO GROWTH 5 DAYS Performed at [facility], [address] [place], [place] [address]    Report Status 03/10/2024 FINAL  Final  Blood Culture (routine x 2)     Status: None   Collection Time: 02/23/24  8:20 PM   Specimen: BLOOD RIGHT ARM  Result Value Ref Range Status   Specimen Description BLOOD RIGHT ARM  Final   Special Requests   Final    BOTTLES DRAWN AEROBIC AND ANAEROBIC Blood Culture results may not be optimal due to an inadequate volume of blood received in culture bottles   Culture   Final    NO GROWTH 5 DAYS Performed at [facility], [address] [place], [place] [address]    Report Status 02/28/2024 FINAL  Final  MRSA Next Gen by PCR, Nasal     Status: None   Collection Time: 02/24/24  6:51 PM   Specimen: Nasal Mucosa; Nasal Swab  Result Value Ref Range Status   MRSA by PCR Next Gen NOT DETECTED NOT DETECTED Final    Comment: (NOTE) The GeneXpert MRSA Assay (FDA approved for NASAL specimens only), is one component of a comprehensive MRSA colonization surveillance program. It is not intended to diagnose MRSA infection nor to guide or monitor treatment for MRSA infections. Test performance is not FDA approved in patients less than 25 years old. Performed at [facility], [address], [place], [place] [address]     Time spent: 35 minutes  Signed: Danette Duos, MD 02/19/2024

## 2024-03-14 NOTE — Progress Notes (Signed)
   02/14/2024 1144  Spiritual Encounters  Type of Visit Initial  Care provided to: Pt and family  Referral source Nurse (RN/NT/LPN)  Reason for visit Patient death  OnCall Visit Yes  Interventions  Spiritual Care Interventions Made Established relationship of care and support;Compassionate presence;Bereavement/grief support;Prayer;Supported grief process  Intervention Outcomes  Outcomes Connection to spiritual care   Chaplain responding to call from RN that Pt had passed and family would like a Orthoptist.  Chaplain arrived in the room to find daughter, nephew, brother, and sister at bedside.  Chaplain provided compassionate presence and offered words of prayer for family.  No further interventions needed at this time unless called.  Chaplain services remain available by Spiritual Consult or for emergent cases, paging [phone]  Chaplain Kara Mullins, MDiv [email or web address] [phone]

## 2024-03-14 NOTE — Progress Notes (Signed)
   Palliative Medicine Inpatient Follow Up Note HPI: A 73 yr old female with ESRD on HD on TTS (AVF), diagnosed PE (01/2024) on Eliquis , Anemia of chronic illness, chronic pancytopenia, chronic diastolic dysfunction (01/12/2024: Echo EF 60%, G1DD, RVSP 31.3 mmHg), PVD, nonhealing left heel ulcer, chronic hypotension since last admission (late March 2025), needed midodrine , PCM, deconditioned, bed bound/wheelchair bound, poor oral intake, and recently admitted to the hospital twice first time for pneumonia from influenza then following which patient also was admitted for pneumonia from aspiration discharged on 01/18/2024.    The PMT knows Kara Mullins well on 03/25/23 she agreed with Dr. Drexel Gentles to be a DNAR/DNI though on 06/13/23 this was rescinded.   Today's Discussion 02/18/2024  *Please note that this is a verbal dictation therefore any spelling or grammatical errors are due to the "Dragon Medical One" system interpretation.  Chart reviewed inclusive of vital signs, progress notes, laboratory results, and diagnostic images.   I met with Kara Mullins at bedside this morning. She is somewhat anxious and perseverates on her "legs". I sat with her to identify what she was saying though was not able to. She appears to be in no pain and is breathing well on RA.   Created space and opportunity for patient to explore thoughts feelings and fears regarding current medical situation.   There is no family at bedside this morning.  Plan to allow time for outcomes.   Questions and concerns addressed/Palliative Support Provided.   Objective Assessment: Vital Signs Vitals:   02/22/2024 0400 02/26/2024 0725  BP: (!) 144/72   Pulse: 71   Resp: 18 17  Temp:    SpO2: 95%     Intake/Output Summary (Last 24 hours) at 02/14/2024 1244 Last data filed at 02/17/2024 1030 Gross per 24 hour  Intake 955.43 ml  Output --  Net 955.43 ml   Last Weight  Most recent update: 03/05/2024  4:24 AM    Weight  57.1 kg (125 lb 14.1 oz)             Gen:  Chronically ill appearing elderly caucasian F HEENT: Dry mucous membranes CV: Regular rate and rhythm  PULM: On RA, breathing is nonlabored ABD: soft/nontender  EXT: No edema  Neuro: Opens eyes and responds though is disoriented  SUMMARY OF RECOMMENDATIONS   DNAR/DNI  Continue to allow time for outcomes  Ongoing PMT support ______________________________________________________________________________________ [facility] Palliative [facility] Team Team Cell Phone: [phone] Please utilize secure chat with additional questions, if there is no response within 30 minutes please call the above phone number  Time Spent: 25  Palliative Medicine Team providers are available by phone from 7am to 7pm daily and can be reached through the team cell phone.  Should this patient require assistance outside of these hours, please call the patient's attending physician.

## 2024-03-14 NOTE — Progress Notes (Signed)
 [name] Progress Note   Subjective:   Na 120 this AM, repeating the lab. Patient's R arm swollen with blisters, pt is not sure when it started. There was discussion of comfort care yesterday but per notes patient now wants to continue current treatments. She has no complaints this AM, Denies SOB, CP, dizziness, nausea.   Objective Vitals:   02/14/2024 0020 02/13/2024 0400 02/26/2024 0424 03/12/2024 0725  BP: (!) 134/57 (!) 144/72    Pulse: 67 71    Resp: 20 18  17   Temp: (!) 97.4 F (36.3 C)     TempSrc: Oral     SpO2: 94% 95%    Weight:   57.1 kg   Height:       Physical Exam General: Alert, elderly female in NAD Heart: RRR, no murmurs, rubs or gallops Lungs: CTA bilaterally, respirations unlabored on RA Abdomen: Soft, non-distended, +BS Extremities: 2+ edema RUE with blisters, trace pretibial edema Dialysis Access:  LUE AVF + t/b  Additional Objective Labs: Basic Metabolic Panel: Recent Labs  Lab 02/25/24 0535 02/26/24 0448 02/27/24 0514 02/26/2024 0445  NA 135 131* 128* 120*  K 3.6 3.6 3.7 3.8  CL 101 96* 94* 89*  CO2 26 28 29 25   GLUCOSE 102* 68* 84 103*  BUN 21 13 23 20   CREATININE 2.63* 1.78* 2.13* 2.00*  CALCIUM  8.2* 7.6* 7.5* 7.5*  PHOS 2.9 2.2* 3.1  --    Liver Function Tests: Recent Labs  Lab 02/24/24 0549 02/25/24 0535  AST 49*  --   ALT 15  --   ALKPHOS 73  --   BILITOT 1.1  --   PROT 6.4*  --   ALBUMIN  2.6* 2.2*   No results for input(s): "LIPASE", "AMYLASE" in the last 168 hours. CBC: Recent Labs  Lab 02/24/24 1838 02/24/24 2104 02/25/24 0535 02/26/24 0448 02/27/24 0917 02/28/24 1227  WBC 7.5  --  10.7* 8.4 9.1 7.0  NEUTROABS 3.0  --   --  7.3 8.2*  --   HGB 5.9*   < > 8.3* 8.0* 8.5* 6.6*  HCT 20.0*   < > 25.5* 24.5* 26.8* 20.3*  MCV 96.2  --  90.4 89.4 91.2 90.2  PLT 35*  --  39* 49* 62* 56*   < > = values in this interval not displayed.   Blood Culture    Component Value Date/Time   SDES BLOOD RIGHT ARM  02/23/2024 2020   SPECREQUEST  02/23/2024 2020    BOTTLES DRAWN AEROBIC AND ANAEROBIC Blood Culture results may not be optimal due to an inadequate volume of blood received in culture bottles   CULT  02/23/2024 2020    NO GROWTH 5 DAYS Performed at [facility], [address] [place], [place] [address]    REPTSTATUS 02/28/2024 FINAL 02/23/2024 2020    Cardiac Enzymes: No results for input(s): "CKTOTAL", "CKMB", "CKMBINDEX", "TROPONINI" in the last 168 hours. CBG: Recent Labs  Lab 02/28/24 1207 02/28/24 2053 02/20/2024 0026 03/02/2024 0359 03/07/2024 0726  GLUCAP 129* 114* 75 100* 98   Iron  Studies: No results for input(s): "IRON ", "TIBC", "TRANSFERRIN", "FERRITIN" in the last 72 hours. @lablastinr3 @ Studies/Results: No results found. Medications:  ampicillin -sulbactam (UNASYN ) IV 3 g (02/28/24 1547)   anticoagulant sodium citrate      dextrose  50 mL/hr at 02/28/24 1321    sodium chloride    Intravenous Once   cadexomer iodine    Topical Daily   Chlorhexidine  Gluconate Cloth  6 each Topical Q0600  feeding supplement  1 Container Oral TID BM   leptospermum manuka honey  1 Application Topical Daily   midodrine   10 mg Oral TID WC   multivitamin  1 tablet Oral Once per day on Monday Wednesday Friday Saturday   mouth rinse  15 mL Mouth Rinse 4 times per day   pantoprazole   40 mg Oral BID   polyethylene glycol  17 g Oral Daily   sodium chloride  flush  10-40 mL Intracatheter Q12H    Dialysis Orders: [facility]  B350   46.1kg  3K bath  L AVF  Heparin  4000 Mircera 50mcg IV q 2 weeks- last dose 02/17/24    Assessment/Plan: Septic shock/ aspiration PNA: getting IV unasyn , IV steroids. Off pressors, on midodrine  10 tid.  Acute hypoxic resp failure: better, off O2 and on room air.  Prob adrenal insufficiency: causing low BS, bradycardia. Getting IV steroids ESRD: on HD TTS. Had HD here Wed and Friday off schedule.  See below regarding volume management BP: bp's stable  today, 120/80 range.  Volume: Na 120 this AM, repeating lab. Her right arm is very swollen, not sure if this is due to volume overload. US  pending. If repeat sodium is still low, will plan for HD tonight. If not, HD tomorrow AM then resume TTS schedule. BP limits volume removal.  Anemia of esrd: 6.6 yesterday. Received 1 unit PRBC. ESA due Tuesday, increase dose.  Secondary hyperparathyroidism: CCa in range, continue binders with meals Dispo: intermittently refusing care, there was discussion of comfort care but for now patient wants to continue current treamtents   Ramona Burner, [name] 02/22/2024, 9:47 AM  [facility] Pager: [phone]

## 2024-03-14 NOTE — Progress Notes (Signed)
 Late Note Entry- Mar 01, 2024  Contacted [facility] this morning to be advised of pt's passing.   Lauraine Polite Renal Navigator [phone]

## 2024-03-14 DEATH — deceased

## 2024-03-31 MED FILL — Medication: Qty: 1 | Status: AC

## 2024-06-11 IMAGING — DX DG CHEST 1V PORT
1 series · 1 of 1 positions shown · non-contrast
Comparison: 12/22/2021

CLINICAL DATA: Shortness of breath, hypertension

EXAM:
PORTABLE CHEST 1 VIEW

[chest ap]
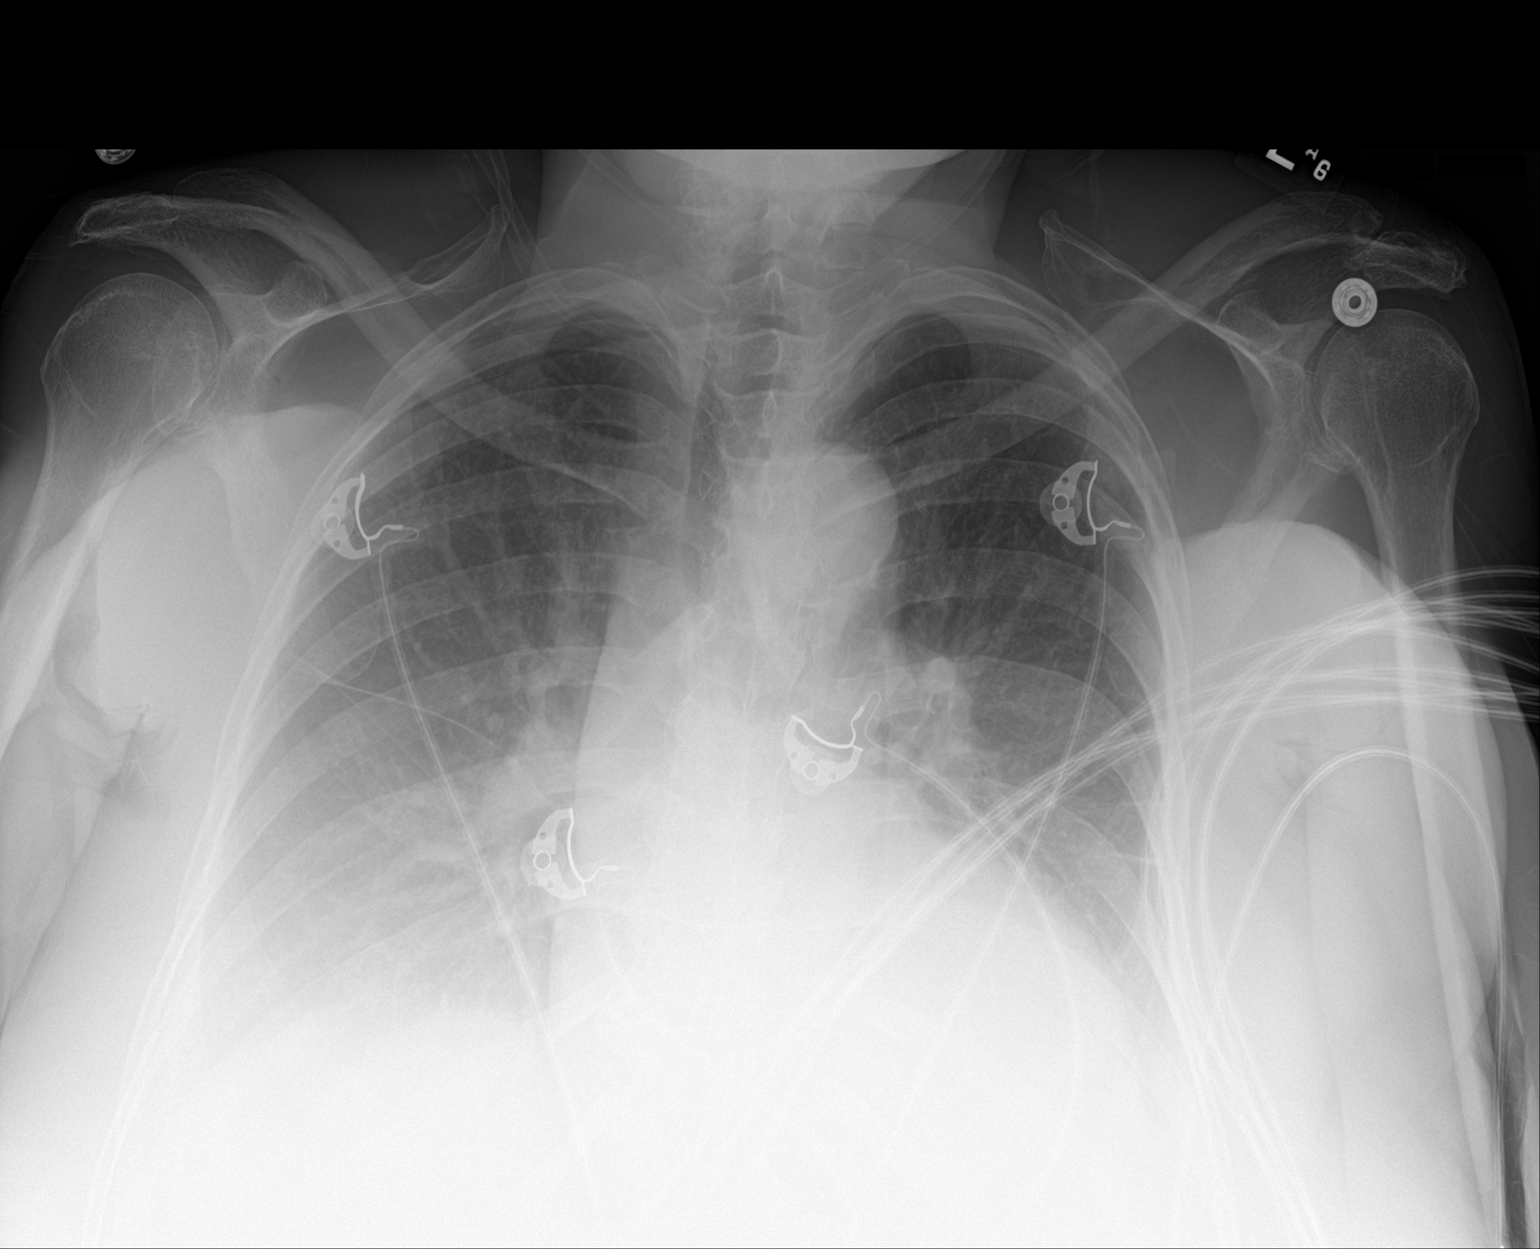

[1 of 1 positions shown; findings below may reference images not displayed]

FINDINGS: Layering bilateral pleural effusions. Bibasilar atelectasis or
infiltrates. Heart is normal size. No acute bony abnormality.
IMPRESSION: Layering bilateral effusions with bibasilar atelectasis or
infiltrates.

## 2024-11-14 ENCOUNTER — Other Ambulatory Visit: Payer: Self-pay | Admitting: Family
